# Patient Record
Sex: Male | Born: 1945 | Race: White
Health system: Southern US, Community
[De-identification: ages and names within clinical notes are randomized; demographics above are authoritative.]

## PROBLEM LIST (undated history)

## (undated) ENCOUNTER — Emergency Department (HOSPITAL_BASED_OUTPATIENT_CLINIC_OR_DEPARTMENT_OTHER): Admission: EM | Payer: Medicare Other | Source: Home / Self Care

## (undated) DIAGNOSIS — E785 Hyperlipidemia, unspecified: Secondary | ICD-10-CM

## (undated) DIAGNOSIS — I499 Cardiac arrhythmia, unspecified: Secondary | ICD-10-CM

## (undated) DIAGNOSIS — G4700 Insomnia, unspecified: Secondary | ICD-10-CM

## (undated) DIAGNOSIS — I10 Essential (primary) hypertension: Secondary | ICD-10-CM

## (undated) DIAGNOSIS — G4730 Sleep apnea, unspecified: Secondary | ICD-10-CM

## (undated) DIAGNOSIS — G2581 Restless legs syndrome: Secondary | ICD-10-CM

## (undated) DIAGNOSIS — R739 Hyperglycemia, unspecified: Secondary | ICD-10-CM

## (undated) DIAGNOSIS — IMO0001 Reserved for inherently not codable concepts without codable children: Secondary | ICD-10-CM

## (undated) DIAGNOSIS — R0600 Dyspnea, unspecified: Secondary | ICD-10-CM

## (undated) DIAGNOSIS — K5900 Constipation, unspecified: Secondary | ICD-10-CM

## (undated) DIAGNOSIS — Z7189 Other specified counseling: Secondary | ICD-10-CM

## (undated) DIAGNOSIS — C7A8 Other malignant neuroendocrine tumors: Secondary | ICD-10-CM

## (undated) DIAGNOSIS — R112 Nausea with vomiting, unspecified: Secondary | ICD-10-CM

## (undated) DIAGNOSIS — E119 Type 2 diabetes mellitus without complications: Secondary | ICD-10-CM

## (undated) DIAGNOSIS — Z923 Personal history of irradiation: Secondary | ICD-10-CM

## (undated) DIAGNOSIS — T8859XA Other complications of anesthesia, initial encounter: Secondary | ICD-10-CM

## (undated) DIAGNOSIS — IMO0002 Reserved for concepts with insufficient information to code with codable children: Secondary | ICD-10-CM

## (undated) DIAGNOSIS — C7B8 Other secondary neuroendocrine tumors: Secondary | ICD-10-CM

## (undated) DIAGNOSIS — C61 Malignant neoplasm of prostate: Secondary | ICD-10-CM

## (undated) DIAGNOSIS — Z9889 Other specified postprocedural states: Secondary | ICD-10-CM

## (undated) DIAGNOSIS — T4145XA Adverse effect of unspecified anesthetic, initial encounter: Secondary | ICD-10-CM

## (undated) HISTORY — DX: Other malignant neuroendocrine tumors: C7A.8

## (undated) HISTORY — PX: COLONOSCOPY: SHX5424

## (undated) HISTORY — PX: ESOPHAGOGASTRODUODENOSCOPY: SHX1529

## (undated) HISTORY — DX: Other secondary neuroendocrine tumors: C7B.8

## (undated) HISTORY — PX: COLONOSCOPY W/ POLYPECTOMY: SHX1380

## (undated) HISTORY — DX: Other specified counseling: Z71.89

## (undated) HISTORY — DX: Reserved for inherently not codable concepts without codable children: IMO0001

## (undated) HISTORY — PX: HAND SURGERY: SHX662

## (undated) HISTORY — DX: Essential (primary) hypertension: I10

## (undated) HISTORY — PX: ROBOT ASSISTED LAPAROSCOPIC RADICAL PROSTATECTOMY: SHX5141

## (undated) HISTORY — DX: Malignant neoplasm of prostate: C61

## (undated) HISTORY — PX: TONSILECTOMY/ADENOIDECTOMY WITH MYRINGOTOMY: SHX6125

## (undated) HISTORY — DX: Reserved for concepts with insufficient information to code with codable children: IMO0002

## (undated) HISTORY — DX: Personal history of irradiation: Z92.3

## (undated) HISTORY — PX: CIRCUMCISION: SUR203

## (undated) SURGERY — PERC NEPH TUBE PLACEMENT
Anesthesia: Conscious Sedation | Laterality: Bilateral

---

## 2011-07-20 ENCOUNTER — Telehealth: Payer: Self-pay | Admitting: Hematology & Oncology

## 2011-07-20 NOTE — Telephone Encounter (Signed)
Pt aware of 07-23-11 appointment

## 2011-07-23 ENCOUNTER — Ambulatory Visit (HOSPITAL_BASED_OUTPATIENT_CLINIC_OR_DEPARTMENT_OTHER): Payer: BC Managed Care – PPO | Admitting: Hematology & Oncology

## 2011-07-23 ENCOUNTER — Ambulatory Visit: Payer: BC Managed Care – PPO

## 2011-07-23 ENCOUNTER — Encounter: Payer: Self-pay | Admitting: Hematology & Oncology

## 2011-07-23 ENCOUNTER — Other Ambulatory Visit (HOSPITAL_BASED_OUTPATIENT_CLINIC_OR_DEPARTMENT_OTHER): Payer: BC Managed Care – PPO | Admitting: Lab

## 2011-07-23 VITALS — BP 124/81 | HR 73 | Temp 97.4°F | Ht 67.0 in | Wt 163.0 lb

## 2011-07-23 DIAGNOSIS — C61 Malignant neoplasm of prostate: Secondary | ICD-10-CM

## 2011-07-23 DIAGNOSIS — C7951 Secondary malignant neoplasm of bone: Secondary | ICD-10-CM

## 2011-07-23 DIAGNOSIS — R911 Solitary pulmonary nodule: Secondary | ICD-10-CM

## 2011-07-23 HISTORY — DX: Malignant neoplasm of prostate: C61

## 2011-07-23 LAB — COMPREHENSIVE METABOLIC PANEL
ALT: 18 U/L (ref 0–53)
AST: 19 U/L (ref 0–37)
Albumin: 4.2 g/dL (ref 3.5–5.2)
Alkaline Phosphatase: 108 U/L (ref 39–117)
BUN: 13 mg/dL (ref 6–23)
CO2: 27 mEq/L (ref 19–32)
Calcium: 9.5 mg/dL (ref 8.4–10.5)
Chloride: 102 mEq/L (ref 96–112)
Creatinine, Ser: 1.18 mg/dL (ref 0.50–1.35)
Glucose, Bld: 91 mg/dL (ref 70–99)
Potassium: 4.7 mEq/L (ref 3.5–5.3)
Sodium: 137 mEq/L (ref 135–145)
Total Bilirubin: 0.3 mg/dL (ref 0.3–1.2)
Total Protein: 7 g/dL (ref 6.0–8.3)

## 2011-07-23 LAB — CBC WITH DIFFERENTIAL (CANCER CENTER ONLY)
BASO#: 0.1 10*3/uL (ref 0.0–0.2)
BASO%: 0.8 % (ref 0.0–2.0)
EOS%: 1.6 % (ref 0.0–7.0)
Eosinophils Absolute: 0.1 10*3/uL (ref 0.0–0.5)
HCT: 44.4 % (ref 38.7–49.9)
HGB: 14.9 g/dL (ref 13.0–17.1)
LYMPH#: 1.6 10*3/uL (ref 0.9–3.3)
LYMPH%: 25.7 % (ref 14.0–48.0)
MCH: 30.2 pg (ref 28.0–33.4)
MCHC: 33.6 g/dL (ref 32.0–35.9)
MCV: 90 fL (ref 82–98)
MONO#: 0.7 10*3/uL (ref 0.1–0.9)
MONO%: 10.2 % (ref 0.0–13.0)
NEUT#: 3.9 10*3/uL (ref 1.5–6.5)
NEUT%: 61.7 % (ref 40.0–80.0)
Platelets: 289 10*3/uL (ref 145–400)
RBC: 4.93 10*6/uL (ref 4.20–5.70)
RDW: 13.1 % (ref 11.1–15.7)
WBC: 6.4 10*3/uL (ref 4.0–10.0)

## 2011-07-23 LAB — PSA: PSA: 0.38 ng/mL (ref <=4.00)

## 2011-07-23 LAB — TESTOSTERONE: Testosterone: 17.2 ng/dL — ABNORMAL LOW (ref 300–890)

## 2011-07-23 MED ORDER — BICALUTAMIDE 50 MG PO TABS: 50.0000 mg | ORAL_TABLET | Freq: Every day | ORAL | Status: AC

## 2011-07-23 NOTE — Progress Notes (Signed)
This office note has been dictated.

## 2011-07-24 ENCOUNTER — Telehealth: Payer: Self-pay | Admitting: Hematology & Oncology

## 2011-07-24 NOTE — Telephone Encounter (Signed)
Pt aware of 7-11 PET and to be NPO 6 hrs prior and 7-15 inj and 8-5 MD

## 2011-07-24 NOTE — Progress Notes (Signed)
CC:   [facility] Fax [phone] [name], MD  DIAGNOSIS:  Metastatic prostate cancer, hormone responsive.  HISTORY OF PRESENT ILLNESS:  [name] is a very nice 66 year old white gentleman.  He is a Technical sales engineer.  He lives down in the [place] area.  He basically presented with localized prostate cancer back in December of 2012.  He underwent a robotic assisted laparoscopic prostatectomy. He had bilateral pelvic lymph node dissection.  This was performed in March of 2013.  His Gleason score was 7.  He had 2 lymph nodes that were negative for tumor.  His initial stage was IIB (T2c N0 M0).  His PSA when checked in June was only down to 3.5.  His PSA went back up.  On 06/19 PSA was 4.74.  He then underwent scanning.  He had a CT scan done in the office of Dr. Margo Aye.  This was of the abdomen and pelvis.  This showed a right lower lung nodule.  There is a questionable nodule in the right hemidiaphragm. No lymphadenopathy was noted in the abdomen or pelvis.  A bone scan was done.  This was done at [facility].  This did show a right posterior sacral ala lesion.  This was "concerning" for metastatic disease.  He was started on androgen deprivation therapy.  He received a dose of Firmagon I think on 06/28.  Dr. Margo Aye kindly referred [name] to the [facility] for further evaluation to see what else might be helpful.  [name] feels okay.  He is not complaining of any pain.  He has not noted any change in bowel or bladder habits.  He has not had any hot flashes or sweats since the [place] injection.  There was some burning at the site of the injection.  He has had no cough.  He has had no headache.  He did have an episode, a couple episodes of vomiting.  He has not had any rash.  There has been no bleeding.  He has had no hair loss.  There has been no leg swelling.  He has not noted any joint aches or pains.  PAST  MEDICAL HISTORY: 1. Hypertension. 2. Hyperlipidemia. 3. Allergic rhinitis.  ALLERGIES: None.  MEDICATIONS: 1. Bactrim DS 1 p.o. b.i.d. 2. Zegerid 1 p.o. daily. 3. Calcium with vitamin D 1 p.o. b.i.d.  SOCIAL HISTORY:  Negative for tobacco use.  He has no alcohol use. There is no obvious occupational exposure.  FAMILY HISTORY:  Remarkable for father who had prostate cancer in his 65s.  His father did not die of prostate cancer.  REVIEW OF SYSTEMS:  As stated in the history of present illness.  No additional findings are noted on a 12 system review.  PHYSICAL EXAMINATION:  General:  This is a well-developed, well- nourished white gentleman in no obvious distress.  Vital signs: Temperature of 97.4, pulse 72, respiratory rate 18, blood pressure 124/81.  Weight is 163.  Head and neck:  Shows a normocephalic, atraumatic skull.  There are no ocular or oral lesions.  There are no palpable cervical or supraclavicular lymph nodes.  Lungs:  Clear to percussion and auscultation bilaterally.  Cardiac:  Regular rate and rhythm with a normal S1, S2.  There are no murmurs, rubs or bruits. Abdomen:  Soft with good bowel sounds.  There is no palpable abdominal mass.  There is no fluid wave.  There is no palpable hepatosplenomegaly. Back:  No tenderness over the  spine, ribs or hips.  Extremities:  Show no clubbing, no cyanosis or edema.  He has good range motion of his joints.  Skin:  Shows no rashes, ecchymoses or petechiae.  He may have a couple areas of folliculitis under his left mandible.  Neurological: Shows no focal neurological deficits.  LABORATORY STUDIES:  White cell count is 6.4, hemoglobin 14.9, hematocrit 44.4, platelet count 289.  His BUN and creatinine are 13 and 1.18.  Calcium is 9.5 with an albumin of 4.2.  Alkaline phosphatase is 108.  IMPRESSION:  [name] is a 66 year old gentleman with what certainly appears to be metastatic prostate cancer.  This is quite concerning.   He just had his radical prostatectomy back in March.  His PSA was not that high.  Unfortunately, PSA did not go down to 0.  He had repeat scans which seemed to show a right sacral alar met.  There is also a right lung nodule which might or may not be metastatic.  I think the fact that he has a fairly high Gleason score is worrisome for him having aggressive disease.  He received a dose of [name] which is in [place]. However, he has not had any menopausal type symptoms of hot flashes or sweats which can be often seen after Firmagon injection.  I really worry about this type of malignancy becoming progressive and becoming castrate resistant.  As such, I think that I would prefer to totally deplete his androgen sources.  He got the [place].  This is a GnRH receptor antagonist.  This rapidly depletes testosterone supplies. We are checking a testosterone level on him.  I would like to add an antiandrogen to this.  I think a total androgen deprivation therapy is appropriate given, what I perceive to be, fairly aggressive prostate cancer.  Hopefully, we will be able to see his PSA coming down.  I want to put him on Casodex.  I think this would be appropriate for him also.  I also believe that given that he has this sacral met that we could consider Xgeva for him.  I am not sure what to make of the CT scan.  I do not know if this pulmonary nodule is something that has been long-term or not.  Certainly it is not that big.  First, I would be very surprised if he had metastasis to his lung.  I will see if we can get a PET scan for him.  There may be some controversy regarding PET scans in prostate cancer but I think it is not unreasonable to order this.  I spent a good hour and a half with [name] and his family.  They are all very, very nice.  I answered all their questions.  I believe that we can certainly take care of him in the office.  We can do all  the necessary therapy treatment for him.  [name] would like this as it would help cut down on the doctors that he has to see.  I will plan to see [name] back in about a month.  We will recheck his PSA when we see him.  I probably would repeat his scans in about 2 months or so, so we can see what kind of effect we have with total androgen blockade.    ______________________________ Josph Macho, M.D. PRE/MEDQ  D:  07/23/2011  T:  07/23/2011  Job:  [id number]

## 2011-07-26 ENCOUNTER — Encounter (HOSPITAL_COMMUNITY)
Admission: RE | Admit: 2011-07-26 | Discharge: 2011-07-26 | Disposition: A | Discharge status: Home / Self Care | Payer: BC Managed Care – PPO | Source: Ambulatory Visit | Attending: Hematology & Oncology | Admitting: Hematology & Oncology

## 2011-07-26 ENCOUNTER — Encounter (HOSPITAL_COMMUNITY): Payer: Self-pay

## 2011-07-26 DIAGNOSIS — Z9079 Acquired absence of other genital organ(s): Secondary | ICD-10-CM | POA: Insufficient documentation

## 2011-07-26 DIAGNOSIS — C61 Malignant neoplasm of prostate: Secondary | ICD-10-CM | POA: Insufficient documentation

## 2011-07-26 DIAGNOSIS — I517 Cardiomegaly: Secondary | ICD-10-CM | POA: Insufficient documentation

## 2011-07-26 DIAGNOSIS — N200 Calculus of kidney: Secondary | ICD-10-CM | POA: Insufficient documentation

## 2011-07-26 DIAGNOSIS — M899 Disorder of bone, unspecified: Secondary | ICD-10-CM | POA: Insufficient documentation

## 2011-07-26 DIAGNOSIS — C7951 Secondary malignant neoplasm of bone: Secondary | ICD-10-CM | POA: Insufficient documentation

## 2011-07-26 DIAGNOSIS — C7952 Secondary malignant neoplasm of bone marrow: Secondary | ICD-10-CM | POA: Insufficient documentation

## 2011-07-26 DIAGNOSIS — I2510 Atherosclerotic heart disease of native coronary artery without angina pectoris: Secondary | ICD-10-CM | POA: Insufficient documentation

## 2011-07-26 DIAGNOSIS — R918 Other nonspecific abnormal finding of lung field: Secondary | ICD-10-CM | POA: Insufficient documentation

## 2011-07-26 DIAGNOSIS — M949 Disorder of cartilage, unspecified: Secondary | ICD-10-CM | POA: Insufficient documentation

## 2011-07-26 DIAGNOSIS — K7689 Other specified diseases of liver: Secondary | ICD-10-CM | POA: Insufficient documentation

## 2011-07-26 LAB — GLUCOSE, CAPILLARY: Glucose-Capillary: 93 mg/dL (ref 70–99)

## 2011-07-26 MED ORDER — FLUDEOXYGLUCOSE F - 18 (FDG) INJECTION
15.1000 | Freq: Once | INTRAVENOUS | Status: AC | PRN
  Administered 2011-07-26: 15.1 via INTRAVENOUS

## 2011-07-27 ENCOUNTER — Other Ambulatory Visit: Payer: Self-pay

## 2011-07-27 ENCOUNTER — Telehealth: Payer: Self-pay | Admitting: Hematology & Oncology

## 2011-07-27 NOTE — Telephone Encounter (Signed)
Faxed completed prior authorization request form for EXGEVA to [facility] for review.

## 2011-07-30 ENCOUNTER — Ambulatory Visit (HOSPITAL_BASED_OUTPATIENT_CLINIC_OR_DEPARTMENT_OTHER): Payer: BC Managed Care – PPO

## 2011-07-30 VITALS — BP 136/76 | HR 73 | Temp 96.7°F

## 2011-07-30 DIAGNOSIS — C7951 Secondary malignant neoplasm of bone: Secondary | ICD-10-CM

## 2011-07-30 DIAGNOSIS — C61 Malignant neoplasm of prostate: Secondary | ICD-10-CM

## 2011-07-30 MED ORDER — SODIUM CHLORIDE 0.9 % IV SOLN: Freq: Once | INTRAVENOUS | Status: DC | PRN

## 2011-07-30 MED ORDER — DENOSUMAB 120 MG/1.7ML ~~LOC~~ SOLN
120.0000 mg | Freq: Once | SUBCUTANEOUS | Status: AC
  Administered 2011-07-30: 120 mg via SUBCUTANEOUS
  Filled 2011-07-30: qty 1.7

## 2011-07-30 MED ORDER — EPINEPHRINE HCL 0.1 MG/ML IJ SOLN
0.2500 mg | Freq: Once | INTRAMUSCULAR | Status: DC | PRN
  Filled 2011-07-30: qty 10

## 2011-07-30 NOTE — Patient Instructions (Signed)
Denosumab injection What is this medicine? DENOSUMAB slows bone breakdown. It is used to treat osteoporosis in women after menopause. This medicine is also used to prevent bone fractures and other bone problems caused by cancer bone metastases. This medicine may be used for other purposes; ask your health care provider or pharmacist if you have questions. What should I tell my health care provider before I take this medicine? They need to know if you have any of these conditions: -dental disease -eczema -infection or history of infections -kidney disease or on dialysis -low blood calcium or vitamin D -malabsorption syndrome -scheduled to have surgery or tooth extraction -taking medicine that contains denosumab -thyroid or parathyroid disease -an unusual reaction to denosumab, other medicines, foods, dyes, or preservatives -pregnant or trying to get pregnant -breast-feeding How should I use this medicine? This medicine is for injection under the skin. It is given by a health care professional in a hospital or clinic setting. If you are getting Prolia, a special MedGuide will be given to you by the pharmacist with each prescription and refill. Be sure to read this information carefully each time. Talk to your pediatrician regarding the use of this medicine in children. Special care may be needed. Overdosage: If you think you've taken too much of this medicine contact a poison control center or emergency room at once. Overdosage: If you think you have taken too much of this medicine contact a poison control center or emergency room at once. NOTE: This medicine is only for you. Do not share this medicine with others. What if I miss a dose? It is important not to miss your dose. Call your doctor or health care professional if you are unable to keep an appointment. What may interact with this medicine? Do not take this medicine with any of the following medications: -other medicines containing  denosumab This medicine may also interact with the following medications: -medicines that suppress the immune system -medicines that treat cancer -steroid medicines like prednisone or cortisone This list may not describe all possible interactions. Give your health care provider a list of all the medicines, herbs, non-prescription drugs, or dietary supplements you use. Also tell them if you smoke, drink alcohol, or use illegal drugs. Some items may interact with your medicine. What should I watch for while using this medicine? Visit your doctor or health care professional for regular checks on your progress. Your doctor or health care professional may order blood tests and other tests to see how you are doing. Call your doctor or health care professional if you get a cold or other infection while receiving this medicine. Do not treat yourself. This medicine may decrease your body's ability to fight infection. You should make sure you get enough calcium and vitamin D while you are taking this medicine, unless your doctor tells you not to. Discuss the foods you eat and the vitamins you take with your health care professional. See your dentist regularly. Brush and floss your teeth as directed. Before you have any dental work done, tell your dentist you are receiving this medicine. What side effects may I notice from receiving this medicine? Side effects that you should report to your doctor or health care professional as soon as possible: -allergic reactions like skin rash, itching or hives, swelling of the face, lips, or tongue -breathing problems -chest pain -fast, irregular heartbeat -feeling faint or lightheaded, falls -fever, chills, or any other sign of infection -muscle spasms, tightening, or twitches -numbness or tingling -skin   blisters or bumps, or is dry, peels, or red -slow healing or unexplained pain in the mouth or jaw -unusual bleeding or bruising Side effects that usually do not  require medical attention (Report these to your doctor or health care professional if they continue or are bothersome.): -muscle pain -stomach upset, gas This list may not describe all possible side effects. Call your doctor for medical advice about side effects. You may report side effects to FDA at 1-800-FDA-1088. Where should I keep my medicine? This medicine is only given in a clinic, doctor's office, or other health care setting and will not be stored at home. NOTE: This sheet is a summary. It may not cover all possible information. If you have questions about this medicine, talk to your doctor, pharmacist, or health care provider.  2012, Elsevier/Gold Standard. (08/24/2009 2:32:46 PM) 

## 2011-07-31 ENCOUNTER — Encounter: Payer: Self-pay | Admitting: Hematology & Oncology

## 2011-08-13 ENCOUNTER — Telehealth: Payer: Self-pay | Admitting: Hematology & Oncology

## 2011-08-13 ENCOUNTER — Other Ambulatory Visit: Payer: Self-pay | Admitting: Hematology & Oncology

## 2011-08-13 NOTE — Telephone Encounter (Signed)
Spoke to patient and informed him of his appointment change.  Patient is now scheduled for 08/27/11 and is aware.

## 2011-08-20 ENCOUNTER — Ambulatory Visit: Payer: BC Managed Care – PPO

## 2011-08-20 ENCOUNTER — Ambulatory Visit: Payer: BC Managed Care – PPO | Admitting: Hematology & Oncology

## 2011-08-20 ENCOUNTER — Other Ambulatory Visit: Payer: BC Managed Care – PPO | Admitting: Lab

## 2011-08-27 ENCOUNTER — Ambulatory Visit (HOSPITAL_BASED_OUTPATIENT_CLINIC_OR_DEPARTMENT_OTHER): Payer: BC Managed Care – PPO | Admitting: Hematology & Oncology

## 2011-08-27 ENCOUNTER — Ambulatory Visit: Payer: BC Managed Care – PPO

## 2011-08-27 ENCOUNTER — Other Ambulatory Visit (HOSPITAL_BASED_OUTPATIENT_CLINIC_OR_DEPARTMENT_OTHER): Payer: BC Managed Care – PPO | Admitting: Lab

## 2011-08-27 VITALS — BP 131/76 | HR 78 | Temp 97.5°F | Resp 20 | Ht 67.0 in | Wt 165.0 lb

## 2011-08-27 DIAGNOSIS — C7951 Secondary malignant neoplasm of bone: Secondary | ICD-10-CM

## 2011-08-27 DIAGNOSIS — E291 Testicular hypofunction: Secondary | ICD-10-CM

## 2011-08-27 DIAGNOSIS — C61 Malignant neoplasm of prostate: Secondary | ICD-10-CM

## 2011-08-27 LAB — CBC WITH DIFFERENTIAL (CANCER CENTER ONLY)
BASO#: 0 10*3/uL (ref 0.0–0.2)
BASO%: 0.5 % (ref 0.0–2.0)
EOS%: 1.1 % (ref 0.0–7.0)
Eosinophils Absolute: 0.1 10*3/uL (ref 0.0–0.5)
HCT: 42.1 % (ref 38.7–49.9)
HGB: 14.2 g/dL (ref 13.0–17.1)
LYMPH#: 1.7 10*3/uL (ref 0.9–3.3)
LYMPH%: 30.5 % (ref 14.0–48.0)
MCH: 30.6 pg (ref 28.0–33.4)
MCHC: 33.7 g/dL (ref 32.0–35.9)
MCV: 91 fL (ref 82–98)
MONO#: 0.5 10*3/uL (ref 0.1–0.9)
MONO%: 9.9 % (ref 0.0–13.0)
NEUT#: 3.2 10*3/uL (ref 1.5–6.5)
NEUT%: 58 % (ref 40.0–80.0)
Platelets: 232 10*3/uL (ref 145–400)
RBC: 4.64 10*6/uL (ref 4.20–5.70)
RDW: 13.2 % (ref 11.1–15.7)
WBC: 5.5 10*3/uL (ref 4.0–10.0)

## 2011-08-27 LAB — CMP (CANCER CENTER ONLY)
ALT(SGPT): 16 U/L (ref 10–47)
AST: 22 U/L (ref 11–38)
Albumin: 3.7 g/dL (ref 3.3–5.5)
Alkaline Phosphatase: 95 U/L — ABNORMAL HIGH (ref 26–84)
BUN, Bld: 13 mg/dL (ref 7–22)
CO2: 29 mEq/L (ref 18–33)
Calcium: 8.9 mg/dL (ref 8.0–10.3)
Chloride: 98 mEq/L (ref 98–108)
Creat: 1.3 mg/dl — ABNORMAL HIGH (ref 0.6–1.2)
Glucose, Bld: 103 mg/dL (ref 73–118)
Potassium: 4.4 mEq/L (ref 3.3–4.7)
Sodium: 139 mEq/L (ref 128–145)
Total Bilirubin: 0.7 mg/dl (ref 0.20–1.60)
Total Protein: 7.5 g/dL (ref 6.4–8.1)

## 2011-08-27 LAB — PSA: PSA: <0.01 ng/mL (ref <=4.00)

## 2011-08-27 NOTE — Progress Notes (Signed)
This office note has been dictated.

## 2011-08-28 ENCOUNTER — Ambulatory Visit (HOSPITAL_BASED_OUTPATIENT_CLINIC_OR_DEPARTMENT_OTHER): Payer: BC Managed Care – PPO

## 2011-08-28 VITALS — BP 125/85 | HR 73 | Temp 97.3°F | Resp 16

## 2011-08-28 DIAGNOSIS — Z5111 Encounter for antineoplastic chemotherapy: Secondary | ICD-10-CM

## 2011-08-28 DIAGNOSIS — C7951 Secondary malignant neoplasm of bone: Secondary | ICD-10-CM

## 2011-08-28 DIAGNOSIS — C61 Malignant neoplasm of prostate: Secondary | ICD-10-CM

## 2011-08-28 MED ORDER — DENOSUMAB 120 MG/1.7ML ~~LOC~~ SOLN
120.0000 mg | Freq: Once | SUBCUTANEOUS | Status: AC
  Administered 2011-08-28: 120 mg via SUBCUTANEOUS
  Filled 2011-08-28: qty 1.7

## 2011-08-28 MED ORDER — DEGARELIX ACETATE 80 MG ~~LOC~~ SOLR
80.0000 mg | Freq: Once | SUBCUTANEOUS | Status: AC
  Administered 2011-08-28: 80 mg via SUBCUTANEOUS
  Filled 2011-08-28: qty 4

## 2011-08-28 NOTE — Progress Notes (Signed)
CC:   [name], MD [facility] (fax [phone])  DIAGNOSES:  Metastatic prostate cancer-hormone responsive.  CURRENT THERAPY: 1. Androgen deprivation therapy with Casodex 50 mg p.o. daily. 2. [name] every month. 3. Exgeva monthly.  INTERIM HISTORY:  Mr. Shugart comes in for followup.  He is doing well. We did go ahead and get a PET scan on him when we first saw him.  This was done on July 11th.  The PET scan did show right sacral osseous metastasis.  No other areas of metastasis were noted.  He did get his lab work done initially.  PSA was 0.38.  His testosterone was down to 17.2.  He has tolerated the Casodex well.  He has had no problems with nausea or vomiting.  There has been no bony pain.  He has had no cough or shortness of breath.  He has had no rashes.  He has no headache.  He has had no change in bowel or bladder habits.  He did see the dermatologist today.  His skin came out clear.  PHYSICAL EXAMINATION:  General:  This is a well-developed, well- nourished, white gentleman in no obvious distress.  Vital signs:  Show a temperature of 97.5, pulse of 78, respiratory rate 18, blood pressure 131/76.  Weight is 165.  Head and neck:  Normocephalic, atraumatic skull.  There are no ocular or oral lesions.  There are no palpable cervical or supraclavicular lymph nodes.  Lungs:  Clear bilaterally. Cardiac:  Regular rate and rhythm with a normal S1 and S2.  There are no murmurs, rubs or bruits.  Abdomen:  Soft with good bowel sounds.  There is no palpable abdominal mass.  There is no fluid wave.  No palpable hepatosplenomegaly is noted.  Back:  No tenderness over the spine, ribs, or hips.  Extremities:  Shows no clubbing, cyanosis or edema.  LABORATORY STUDIES:  White cell count is 5.5, hemoglobin 14.2, hematocrit 43.1, platelet count 232.  IMPRESSION:  Mr. Deese is a 66 year old gentleman with metastatic prostate cancer.  He is on androgen deprivation  therapy.  He initially got his [name] elsewhere.  We will have to see when he needs to get this again.  [name] is a GNRH receptor antagonist.  We will go ahead and give him the Casodex today.  We will plan to get him back in 1 more month.   ______________________________ Josph Macho, M.D. PRE/MEDQ  D:  08/27/2011  T:  08/28/2011  Job:  [id number]

## 2011-08-29 ENCOUNTER — Telehealth: Payer: Self-pay | Admitting: *Deleted

## 2011-08-29 NOTE — Telephone Encounter (Signed)
Message copied by Anselm Jungling on Wed Aug 29, 2011 12:58 PM ------      Message from: Josph Macho      Created: Tue Aug 28, 2011  9:11 PM       Call - PSA is now so low it is undetectable!!!  pete

## 2011-08-29 NOTE — Telephone Encounter (Signed)
Called patient to let him know that his PSA was now so low it is undectectable.

## 2011-09-11 ENCOUNTER — Telehealth: Payer: Self-pay | Admitting: Hematology & Oncology

## 2011-09-11 NOTE — Telephone Encounter (Signed)
Patient's wife called and cx 09/24/11 appt and resch for 09/28/11

## 2011-09-24 ENCOUNTER — Other Ambulatory Visit: Payer: BC Managed Care – PPO | Admitting: Lab

## 2011-09-24 ENCOUNTER — Ambulatory Visit: Payer: BC Managed Care – PPO | Admitting: Hematology & Oncology

## 2011-09-24 ENCOUNTER — Ambulatory Visit: Payer: BC Managed Care – PPO

## 2011-09-28 ENCOUNTER — Other Ambulatory Visit (HOSPITAL_BASED_OUTPATIENT_CLINIC_OR_DEPARTMENT_OTHER): Payer: BC Managed Care – PPO | Admitting: Lab

## 2011-09-28 ENCOUNTER — Ambulatory Visit (HOSPITAL_BASED_OUTPATIENT_CLINIC_OR_DEPARTMENT_OTHER): Payer: BC Managed Care – PPO | Admitting: Hematology & Oncology

## 2011-09-28 ENCOUNTER — Ambulatory Visit (HOSPITAL_BASED_OUTPATIENT_CLINIC_OR_DEPARTMENT_OTHER): Payer: BC Managed Care – PPO

## 2011-09-28 VITALS — BP 129/72 | HR 81 | Temp 97.8°F | Resp 20 | Ht 67.0 in | Wt 164.0 lb

## 2011-09-28 DIAGNOSIS — C61 Malignant neoplasm of prostate: Secondary | ICD-10-CM

## 2011-09-28 DIAGNOSIS — Z5111 Encounter for antineoplastic chemotherapy: Secondary | ICD-10-CM

## 2011-09-28 DIAGNOSIS — C7951 Secondary malignant neoplasm of bone: Secondary | ICD-10-CM

## 2011-09-28 DIAGNOSIS — N951 Menopausal and female climacteric states: Secondary | ICD-10-CM

## 2011-09-28 DIAGNOSIS — G4700 Insomnia, unspecified: Secondary | ICD-10-CM

## 2011-09-28 DIAGNOSIS — C7952 Secondary malignant neoplasm of bone marrow: Secondary | ICD-10-CM

## 2011-09-28 LAB — PSA: PSA: <0.01 ng/mL (ref <=4.00)

## 2011-09-28 LAB — CBC WITH DIFFERENTIAL (CANCER CENTER ONLY)
BASO#: 0 10*3/uL (ref 0.0–0.2)
BASO%: 0.2 % (ref 0.0–2.0)
EOS%: 1.9 % (ref 0.0–7.0)
Eosinophils Absolute: 0.1 10*3/uL (ref 0.0–0.5)
HCT: 42.7 % (ref 38.7–49.9)
HGB: 14.3 g/dL (ref 13.0–17.1)
LYMPH#: 1.6 10*3/uL (ref 0.9–3.3)
LYMPH%: 34.5 % (ref 14.0–48.0)
MCH: 30.6 pg (ref 28.0–33.4)
MCHC: 33.5 g/dL (ref 32.0–35.9)
MCV: 91 fL (ref 82–98)
MONO#: 0.4 10*3/uL (ref 0.1–0.9)
MONO%: 7.5 % (ref 0.0–13.0)
NEUT#: 2.6 10*3/uL (ref 1.5–6.5)
NEUT%: 55.9 % (ref 40.0–80.0)
Platelets: 210 10*3/uL (ref 145–400)
RBC: 4.67 10*6/uL (ref 4.20–5.70)
RDW: 13.3 % (ref 11.1–15.7)
WBC: 4.6 10*3/uL (ref 4.0–10.0)

## 2011-09-28 LAB — COMPREHENSIVE METABOLIC PANEL
ALT: 17 U/L (ref 0–53)
AST: 17 U/L (ref 0–37)
Albumin: 4.6 g/dL (ref 3.5–5.2)
Alkaline Phosphatase: 65 U/L (ref 39–117)
BUN: 12 mg/dL (ref 6–23)
CO2: 27 mEq/L (ref 19–32)
Calcium: 9.6 mg/dL (ref 8.4–10.5)
Chloride: 103 mEq/L (ref 96–112)
Creatinine, Ser: 1.01 mg/dL (ref 0.50–1.35)
Glucose, Bld: 102 mg/dL — ABNORMAL HIGH (ref 70–99)
Potassium: 4.3 mEq/L (ref 3.5–5.3)
Sodium: 139 mEq/L (ref 135–145)
Total Bilirubin: 0.6 mg/dL (ref 0.3–1.2)
Total Protein: 7.2 g/dL (ref 6.0–8.3)

## 2011-09-28 MED ORDER — SODIUM CHLORIDE 0.9 % IV SOLN: Freq: Once | INTRAVENOUS | Status: DC | PRN

## 2011-09-28 MED ORDER — DEGARELIX ACETATE 80 MG ~~LOC~~ SOLR
80.0000 mg | Freq: Once | SUBCUTANEOUS | Status: AC
  Administered 2011-09-28: 80 mg via SUBCUTANEOUS
  Filled 2011-09-28: qty 4

## 2011-09-28 MED ORDER — ZOLPIDEM TARTRATE 5 MG PO TABS: 5.0000 mg | ORAL_TABLET | Freq: Every evening | ORAL | Status: DC | PRN

## 2011-09-28 MED ORDER — DIPHENHYDRAMINE HCL 50 MG/ML IJ SOLN: 25.0000 mg | Freq: Once | INTRAMUSCULAR | Status: DC | PRN

## 2011-09-28 MED ORDER — DIPHENHYDRAMINE HCL 50 MG/ML IJ SOLN: 50.0000 mg | Freq: Once | INTRAMUSCULAR | Status: DC | PRN

## 2011-09-28 MED ORDER — ALBUTEROL SULFATE (2.5 MG/3ML) 0.083% IN NEBU
2.5000 mg | INHALATION_SOLUTION | Freq: Once | RESPIRATORY_TRACT | Status: DC | PRN
  Filled 2011-09-28: qty 3

## 2011-09-28 MED ORDER — EPINEPHRINE HCL 0.1 MG/ML IJ SOLN
0.2500 mg | Freq: Once | INTRAMUSCULAR | Status: DC | PRN
  Filled 2011-09-28: qty 10

## 2011-09-28 MED ORDER — DENOSUMAB 120 MG/1.7ML ~~LOC~~ SOLN
120.0000 mg | Freq: Once | SUBCUTANEOUS | Status: AC
  Administered 2011-09-28: 120 mg via SUBCUTANEOUS
  Filled 2011-09-28: qty 1.7

## 2011-09-28 MED ORDER — METHYLPREDNISOLONE SODIUM SUCC 125 MG IJ SOLR
125.0000 mg | Freq: Once | INTRAMUSCULAR | Status: DC | PRN
Start: 2011-09-28 — End: 2011-09-28

## 2011-09-28 MED ORDER — GABAPENTIN 100 MG PO CAPS: ORAL_CAPSULE | ORAL | Status: DC

## 2011-09-28 NOTE — Progress Notes (Signed)
This office note has been dictated.

## 2011-10-01 NOTE — Progress Notes (Signed)
CC:   [facility], [place] [phone]  DIAGNOSIS:  Metastatic prostate cancer, hormone-responsive.  CURRENT THERAPY: 1. Casodex 50 mg p.o. daily. 2. Firmagon q. month. 3. Xgeva 120 mg subcu q. Month.  INTERIM HISTORY:  [name] comes in for his followup.  He feels fairly well.  He has had no problems with nausea or vomiting.  He really has not had any pain issues.  He has had no fever, sweats or chills.  He does state some problems with hot flashes.  He is not sleeping all that well.  I did go ahead and give him a prescription for Neurontin 100 mg p.o. q.8 hours and Ambien 5 mg p.o. q.h.s. p.r.n.  His last PSA was less than 0.01.  PHYSICAL EXAMINATION:  This is a well-developed, well-nourished white gentleman in no obvious distress.  Vital signs:  97.8, pulse 81, respiratory rate 20, blood pressure 129/72.  Weight is 164.  Head and neck:  Normocephalic, atraumatic skull.  There are no ocular or oral lesions.  There are no palpable cervical or supraclavicular lymph nodes. Lungs:  Clear bilaterally.  Cardiac:  Regular rate and rhythm with a normal S1 and S2.  There are no murmurs, rubs or bruits.  Abdomen:  Soft with good bowel sounds.  There is no palpable abdominal mass.  There is no fluid wave.  There is no palpable hepatosplenomegaly.  Extremities: No clubbing, cyanosis or edema.  LABORATORY STUDIES:  White count is 4.6, hemoglobin 14.3, hematocrit 42.7, platelet count is 210.  IMPRESSION:  [name] is a 66 year old gentleman with metastatic prostate cancer.  He has bone metastasis.  He is on an androgen deprivation.  He gets from [place] and Casodex.  Again, this has helped by his PSA.  We will go ahead and plan for a followup bone scan next month.  I will get this before I see him back.  I will see him back in 1 month.   ______________________________ Josph Macho, M.D. PRE/MEDQ  D:  09/28/2011  T:  09/28/2011  Job:  [id number]

## 2011-10-04 ENCOUNTER — Telehealth: Payer: Self-pay | Admitting: *Deleted

## 2011-10-04 NOTE — Telephone Encounter (Signed)
Message copied by Anselm Jungling on Thu Oct 04, 2011 11:02 AM ------      Message from: Arlan Organ R      Created: Wed Oct 03, 2011 11:35 AM       Call - PSA is too low to measure!!!  Cindee Lame

## 2011-10-04 NOTE — Telephone Encounter (Signed)
Called patient to let him know that his PSA level was too low to measure per dr. Myna Hidalgo

## 2011-10-23 ENCOUNTER — Encounter (HOSPITAL_COMMUNITY): Payer: BC Managed Care – PPO

## 2011-10-23 ENCOUNTER — Ambulatory Visit (HOSPITAL_COMMUNITY): Payer: BC Managed Care – PPO

## 2011-10-25 ENCOUNTER — Ambulatory Visit: Payer: BC Managed Care – PPO | Admitting: Hematology & Oncology

## 2011-10-25 ENCOUNTER — Other Ambulatory Visit: Payer: BC Managed Care – PPO | Admitting: Lab

## 2011-10-25 ENCOUNTER — Ambulatory Visit: Payer: BC Managed Care – PPO

## 2011-10-30 ENCOUNTER — Ambulatory Visit (HOSPITAL_COMMUNITY)
Admission: RE | Admit: 2011-10-30 | Discharge: 2011-10-30 | Disposition: A | Discharge status: Home / Self Care | Payer: BC Managed Care – PPO | Source: Ambulatory Visit | Attending: Hematology & Oncology | Admitting: Hematology & Oncology

## 2011-10-30 ENCOUNTER — Encounter (HOSPITAL_COMMUNITY)
Admission: RE | Admit: 2011-10-30 | Discharge: 2011-10-30 | Disposition: A | Discharge status: Home / Self Care | Payer: BC Managed Care – PPO | Source: Ambulatory Visit | Attending: Hematology & Oncology | Admitting: Hematology & Oncology

## 2011-10-30 DIAGNOSIS — C61 Malignant neoplasm of prostate: Secondary | ICD-10-CM | POA: Insufficient documentation

## 2011-10-30 DIAGNOSIS — C7951 Secondary malignant neoplasm of bone: Secondary | ICD-10-CM | POA: Insufficient documentation

## 2011-10-30 DIAGNOSIS — G4700 Insomnia, unspecified: Secondary | ICD-10-CM

## 2011-10-30 DIAGNOSIS — N951 Menopausal and female climacteric states: Secondary | ICD-10-CM

## 2011-10-30 MED ORDER — TECHNETIUM TC 99M MEDRONATE IV KIT
25.0000 | PACK | Freq: Once | INTRAVENOUS | Status: AC | PRN
  Administered 2011-10-30: 25 via INTRAVENOUS

## 2011-11-02 ENCOUNTER — Ambulatory Visit (HOSPITAL_BASED_OUTPATIENT_CLINIC_OR_DEPARTMENT_OTHER): Payer: BC Managed Care – PPO

## 2011-11-02 ENCOUNTER — Ambulatory Visit (HOSPITAL_BASED_OUTPATIENT_CLINIC_OR_DEPARTMENT_OTHER): Payer: BC Managed Care – PPO | Admitting: Hematology & Oncology

## 2011-11-02 ENCOUNTER — Other Ambulatory Visit (HOSPITAL_BASED_OUTPATIENT_CLINIC_OR_DEPARTMENT_OTHER): Payer: BC Managed Care – PPO | Admitting: Lab

## 2011-11-02 VITALS — BP 138/65 | HR 72 | Temp 98.1°F | Resp 18 | Ht 67.0 in | Wt 165.0 lb

## 2011-11-02 DIAGNOSIS — C7951 Secondary malignant neoplasm of bone: Secondary | ICD-10-CM

## 2011-11-02 DIAGNOSIS — C61 Malignant neoplasm of prostate: Secondary | ICD-10-CM

## 2011-11-02 DIAGNOSIS — N951 Menopausal and female climacteric states: Secondary | ICD-10-CM

## 2011-11-02 DIAGNOSIS — G4700 Insomnia, unspecified: Secondary | ICD-10-CM

## 2011-11-02 DIAGNOSIS — Z5111 Encounter for antineoplastic chemotherapy: Secondary | ICD-10-CM

## 2011-11-02 DIAGNOSIS — C7952 Secondary malignant neoplasm of bone marrow: Secondary | ICD-10-CM

## 2011-11-02 LAB — CBC WITH DIFFERENTIAL (CANCER CENTER ONLY)
BASO#: 0 10*3/uL (ref 0.0–0.2)
BASO%: 0.3 % (ref 0.0–2.0)
EOS%: 2.2 % (ref 0.0–7.0)
Eosinophils Absolute: 0.1 10*3/uL (ref 0.0–0.5)
HCT: 40.6 % (ref 38.7–49.9)
HGB: 14 g/dL (ref 13.0–17.1)
LYMPH#: 1.9 10*3/uL (ref 0.9–3.3)
LYMPH%: 32.2 % (ref 14.0–48.0)
MCH: 31.5 pg (ref 28.0–33.4)
MCHC: 34.5 g/dL (ref 32.0–35.9)
MCV: 91 fL (ref 82–98)
MONO#: 0.6 10*3/uL (ref 0.1–0.9)
MONO%: 9.7 % (ref 0.0–13.0)
NEUT#: 3.2 10*3/uL (ref 1.5–6.5)
NEUT%: 55.6 % (ref 40.0–80.0)
Platelets: 208 10*3/uL (ref 145–400)
RBC: 4.44 10*6/uL (ref 4.20–5.70)
RDW: 12.7 % (ref 11.1–15.7)
WBC: 5.8 10*3/uL (ref 4.0–10.0)

## 2011-11-02 LAB — COMPREHENSIVE METABOLIC PANEL
ALT: 14 U/L (ref 0–53)
AST: 16 U/L (ref 0–37)
Albumin: 4.3 g/dL (ref 3.5–5.2)
Alkaline Phosphatase: 66 U/L (ref 39–117)
BUN: 13 mg/dL (ref 6–23)
CO2: 29 mEq/L (ref 19–32)
Calcium: 9.8 mg/dL (ref 8.4–10.5)
Chloride: 103 mEq/L (ref 96–112)
Creatinine, Ser: 1 mg/dL (ref 0.50–1.35)
Glucose, Bld: 95 mg/dL (ref 70–99)
Potassium: 4.3 mEq/L (ref 3.5–5.3)
Sodium: 140 mEq/L (ref 135–145)
Total Bilirubin: 0.5 mg/dL (ref 0.3–1.2)
Total Protein: 7.1 g/dL (ref 6.0–8.3)

## 2011-11-02 LAB — PSA: PSA: <0.01 ng/mL (ref <=4.00)

## 2011-11-02 LAB — TESTOSTERONE: Testosterone: 17.02 ng/dL — ABNORMAL LOW (ref 300–890)

## 2011-11-02 MED ORDER — DEGARELIX ACETATE 80 MG ~~LOC~~ SOLR
80.0000 mg | Freq: Once | SUBCUTANEOUS | Status: AC
  Administered 2011-11-02: 80 mg via SUBCUTANEOUS
  Filled 2011-11-02: qty 4

## 2011-11-02 MED ORDER — DENOSUMAB 120 MG/1.7ML ~~LOC~~ SOLN
120.0000 mg | Freq: Once | SUBCUTANEOUS | Status: AC
  Administered 2011-11-02: 120 mg via SUBCUTANEOUS
  Filled 2011-11-02: qty 1.7

## 2011-11-02 NOTE — Progress Notes (Signed)
This office note has been dictated.

## 2011-11-05 ENCOUNTER — Telehealth: Payer: Self-pay | Admitting: Hematology & Oncology

## 2011-11-05 ENCOUNTER — Telehealth: Payer: Self-pay | Admitting: *Deleted

## 2011-11-05 NOTE — Telephone Encounter (Addendum)
Message copied by Cathi Roan on Mon Nov 05, 2011  2:42 PM ------      Message from: Josph Macho      Created: Sat Nov 03, 2011 11:39 AM       Call - PSA still not detectable!!!  Cindee Lame  11-05-11  2:42pm   Called and spoke with patients wife, above MD message given to wife. States she would call back if any questions. Lupita Raider LPN

## 2011-11-05 NOTE — Progress Notes (Signed)
CC:   Michael Dubin, MD [facility], [place] [phone]  DIAGNOSIS:  Metastatic prostate cancer, hormone-responsive.  CURRENT THERAPY: 1. Casodex 50 mg p.o. daily. 2. Firmagon q. month. 3. Xgeva q. month.  INTERIM HISTORY:  [name] comes in for his followup.  He is doing quite well.  He is really not complaining of any pain.  He has been pretty active.  He has had some hot flashes and sweats.  We have done a good job in depleting his testosterone.  When we last saw him in September, his PSA was less than 0.01.  We did go ahead and do a bone scan on him.  This was done on 10/28/2011. The bone scan showed a decreased intensity within the right sacrum. This is indicative of a response.  No new bony lesions were noted.  He has had no cough or shortness of breath.  There has been no change in bowel or bladder habits.  He has had no headache.  PHYSICAL EXAMINATION:  General:  This is a well-developed, well- nourished white gentleman, in no obvious distress.  Vital signs: Temperature of 98.1, pulse 72, respiratory rate 18, blood pressure 138/65.  Weight is 165.  Head and neck:  Normocephalic, atraumatic skull.  There are no ocular or oral lesions.  There are no palpable cervical or supraclavicular lymph nodes.  Lungs:  Clear bilaterally. Cardiac:  Regular rate and rhythm, with a normal S1 and S2.  There are no murmurs, rubs, or bruits.  Abdomen:  Soft, with good bowel sounds. There is no palpable abdominal mass.  There is no fluid wave.  There is no palpable hepatosplenomegaly.  Extremities:  No clubbing, cyanosis or edema.  Back:  Shows no tenderness over the spine, ribs, or hips. Neurologic:  No focal neurological deficits.  LABORATORY STUDIES:  White cell count is 5.8, hemoglobin 14, hematocrit 40.6, platelet count 208,000.  IMPRESSION:  [name] is a 66 year old gentleman with metastatic prostate cancer.  He still would be considered hormone responsive.  He will go  ahead with his [place] today.  He will get the [facility] also.  We will plan to see him back in 2 months for ourselves.  He will come in in 1 month for the [place].  As always, we had great fellowship with [name].    ______________________________ Josph Macho, M.D. PRE/MEDQ  D:  11/02/2011  T:  11/02/2011  Job:  [id number]

## 2011-11-05 NOTE — Telephone Encounter (Addendum)
Message copied by Mirian Capuchin on Mon Nov 05, 2011 11:30 AM ------      Message from: Josph Macho      Created: Sat Nov 03, 2011 11:39 AM       Call - PSA still not detectable!!!  Cindee Lame This message given to pt.  Voiced understanding.

## 2011-11-30 ENCOUNTER — Ambulatory Visit (HOSPITAL_BASED_OUTPATIENT_CLINIC_OR_DEPARTMENT_OTHER): Payer: BC Managed Care – PPO

## 2011-11-30 VITALS — BP 128/80 | HR 73 | Temp 97.3°F | Resp 16

## 2011-11-30 DIAGNOSIS — C61 Malignant neoplasm of prostate: Secondary | ICD-10-CM

## 2011-11-30 DIAGNOSIS — Z5111 Encounter for antineoplastic chemotherapy: Secondary | ICD-10-CM

## 2011-11-30 DIAGNOSIS — C7951 Secondary malignant neoplasm of bone: Secondary | ICD-10-CM

## 2011-11-30 MED ORDER — DEGARELIX ACETATE 80 MG ~~LOC~~ SOLR
80.0000 mg | Freq: Once | SUBCUTANEOUS | Status: AC
  Administered 2011-11-30: 80 mg via SUBCUTANEOUS
  Filled 2011-11-30: qty 4

## 2011-11-30 MED ORDER — SODIUM CHLORIDE 0.9 % IV SOLN: Freq: Once | INTRAVENOUS | Status: DC | PRN

## 2011-11-30 MED ORDER — DENOSUMAB 120 MG/1.7ML ~~LOC~~ SOLN
120.0000 mg | Freq: Once | SUBCUTANEOUS | Status: AC
  Administered 2011-11-30: 120 mg via SUBCUTANEOUS
  Filled 2011-11-30: qty 1.7

## 2011-12-21 ENCOUNTER — Telehealth: Payer: Self-pay | Admitting: *Deleted

## 2011-12-21 ENCOUNTER — Other Ambulatory Visit: Payer: Self-pay | Admitting: Hematology & Oncology

## 2011-12-21 DIAGNOSIS — M25511 Pain in right shoulder: Secondary | ICD-10-CM

## 2011-12-21 DIAGNOSIS — M25512 Pain in left shoulder: Secondary | ICD-10-CM

## 2011-12-21 NOTE — Telephone Encounter (Signed)
Patient wife calling on behalf of husband stating that his shoulder pain has not improved over the last week.  Patient had called last week stating that left shoulder has pain and is painful to move.  Spoke with Dr. Myna Hidalgo.  He will put in orders for MRI according to Dr. Myna Hidalgo

## 2011-12-25 ENCOUNTER — Ambulatory Visit (HOSPITAL_BASED_OUTPATIENT_CLINIC_OR_DEPARTMENT_OTHER)
Admission: RE | Admit: 2011-12-25 | Discharge: 2011-12-25 | Disposition: A | Discharge status: Home / Self Care | Payer: BC Managed Care – PPO | Source: Ambulatory Visit | Attending: Hematology & Oncology | Admitting: Hematology & Oncology

## 2011-12-25 ENCOUNTER — Other Ambulatory Visit (HOSPITAL_BASED_OUTPATIENT_CLINIC_OR_DEPARTMENT_OTHER): Payer: BC Managed Care – PPO | Admitting: Lab

## 2011-12-25 DIAGNOSIS — M25519 Pain in unspecified shoulder: Secondary | ICD-10-CM | POA: Insufficient documentation

## 2011-12-25 DIAGNOSIS — M25511 Pain in right shoulder: Secondary | ICD-10-CM

## 2011-12-25 DIAGNOSIS — C61 Malignant neoplasm of prostate: Secondary | ICD-10-CM

## 2011-12-25 LAB — BASIC METABOLIC PANEL - CANCER CENTER ONLY
BUN, Bld: 13 mg/dL (ref 7–22)
CO2: 29 mEq/L (ref 18–33)
Calcium: 9.5 mg/dL (ref 8.0–10.3)
Chloride: 100 mEq/L (ref 98–108)
Creat: 0.9 mg/dl (ref 0.6–1.2)
Glucose, Bld: 125 mg/dL — ABNORMAL HIGH (ref 73–118)
Potassium: 3.9 mEq/L (ref 3.3–4.7)
Sodium: 143 mEq/L (ref 128–145)

## 2011-12-25 MED ORDER — GADOBENATE DIMEGLUMINE 529 MG/ML IV SOLN
15.0000 mL | Freq: Once | INTRAVENOUS | Status: AC | PRN
  Administered 2011-12-25: 15 mL via INTRAVENOUS

## 2011-12-28 ENCOUNTER — Ambulatory Visit (HOSPITAL_BASED_OUTPATIENT_CLINIC_OR_DEPARTMENT_OTHER): Payer: BC Managed Care – PPO

## 2011-12-28 ENCOUNTER — Other Ambulatory Visit (HOSPITAL_BASED_OUTPATIENT_CLINIC_OR_DEPARTMENT_OTHER): Payer: BC Managed Care – PPO | Admitting: Lab

## 2011-12-28 ENCOUNTER — Ambulatory Visit (HOSPITAL_BASED_OUTPATIENT_CLINIC_OR_DEPARTMENT_OTHER): Payer: BC Managed Care – PPO | Admitting: Hematology & Oncology

## 2011-12-28 VITALS — BP 142/73 | HR 73 | Temp 97.8°F | Resp 18 | Ht 67.0 in | Wt 170.0 lb

## 2011-12-28 DIAGNOSIS — C7951 Secondary malignant neoplasm of bone: Secondary | ICD-10-CM

## 2011-12-28 DIAGNOSIS — C7952 Secondary malignant neoplasm of bone marrow: Secondary | ICD-10-CM

## 2011-12-28 DIAGNOSIS — C61 Malignant neoplasm of prostate: Secondary | ICD-10-CM

## 2011-12-28 DIAGNOSIS — M25519 Pain in unspecified shoulder: Secondary | ICD-10-CM

## 2011-12-28 DIAGNOSIS — Z5111 Encounter for antineoplastic chemotherapy: Secondary | ICD-10-CM

## 2011-12-28 LAB — PSA: PSA: <0.01 ng/mL (ref <=4.00)

## 2011-12-28 LAB — COMPREHENSIVE METABOLIC PANEL
ALT: 11 U/L (ref 0–53)
AST: 15 U/L (ref 0–37)
Albumin: 4.7 g/dL (ref 3.5–5.2)
Alkaline Phosphatase: 62 U/L (ref 39–117)
BUN: 16 mg/dL (ref 6–23)
CO2: 28 mEq/L (ref 19–32)
Calcium: 9.6 mg/dL (ref 8.4–10.5)
Chloride: 103 mEq/L (ref 96–112)
Creatinine, Ser: 0.88 mg/dL (ref 0.50–1.35)
Glucose, Bld: 80 mg/dL (ref 70–99)
Potassium: 4.4 mEq/L (ref 3.5–5.3)
Sodium: 139 mEq/L (ref 135–145)
Total Bilirubin: 0.3 mg/dL (ref 0.3–1.2)
Total Protein: 7.2 g/dL (ref 6.0–8.3)

## 2011-12-28 LAB — CBC WITH DIFFERENTIAL (CANCER CENTER ONLY)
BASO#: 0 10*3/uL (ref 0.0–0.2)
BASO%: 0.5 % (ref 0.0–2.0)
EOS%: 2.2 % (ref 0.0–7.0)
Eosinophils Absolute: 0.1 10*3/uL (ref 0.0–0.5)
HCT: 42.7 % (ref 38.7–49.9)
HGB: 14.5 g/dL (ref 13.0–17.1)
LYMPH#: 1.7 10*3/uL (ref 0.9–3.3)
LYMPH%: 31.8 % (ref 14.0–48.0)
MCH: 31.5 pg (ref 28.0–33.4)
MCHC: 34 g/dL (ref 32.0–35.9)
MCV: 93 fL (ref 82–98)
MONO#: 0.5 10*3/uL (ref 0.1–0.9)
MONO%: 9.5 % (ref 0.0–13.0)
NEUT#: 3.1 10*3/uL (ref 1.5–6.5)
NEUT%: 56 % (ref 40.0–80.0)
Platelets: 224 10*3/uL (ref 145–400)
RBC: 4.6 10*6/uL (ref 4.20–5.70)
RDW: 12.3 % (ref 11.1–15.7)
WBC: 5.5 10*3/uL (ref 4.0–10.0)

## 2011-12-28 LAB — TESTOSTERONE: Testosterone: 20.8 ng/dL — ABNORMAL LOW (ref 300–890)

## 2011-12-28 MED ORDER — DENOSUMAB 120 MG/1.7ML ~~LOC~~ SOLN
120.0000 mg | Freq: Once | SUBCUTANEOUS | Status: AC
  Administered 2011-12-28: 120 mg via SUBCUTANEOUS
  Filled 2011-12-28: qty 1.7

## 2011-12-28 MED ORDER — DEGARELIX ACETATE 80 MG ~~LOC~~ SOLR
80.0000 mg | Freq: Once | SUBCUTANEOUS | Status: AC
  Administered 2011-12-28: 80 mg via SUBCUTANEOUS
  Filled 2011-12-28: qty 4

## 2011-12-28 NOTE — Progress Notes (Signed)
This office note has been dictated.

## 2011-12-28 NOTE — Patient Instructions (Signed)
Degarelix injection What is this medicine? DEGARELIX (deg a REL ix) is used to treat men with advanced prostate cancer. This medicine may be used for other purposes; ask your health care provider or pharmacist if you have questions. What should I tell my health care provider before I take this medicine? They need to know if you have any of these conditions: -heart disease -kidney disease -liver disease -low levels of potassium or magnesium in the blood -osteoporosis -an unusual or allergic reaction to degarelix, mannitol, other medicines, foods, dyes, or preservatives -pregnant or trying to get pregnant -breast-feeding How should I use this medicine? This medicine is for injection under the skin. It is usually given by a health care professional in a hospital or clinic setting. If you get this medicine at home, you will be taught how to prepare and give this medicine. Use exactly as directed. Take your medicine at regular intervals. Do not take it more often than directed. It is important that you put your used needles and syringes in a special sharps container. Do not put them in a trash can. If you do not have a sharps container, call your pharmacist or healthcare provider to get one. Talk to your pediatrician regarding the use of this medicine in children. Special care may be needed. Overdosage: If you think you've taken too much of this medicine contact a poison control center or emergency room at once. Overdosage: If you think you have taken too much of this medicine contact a poison control center or emergency room at once. NOTE: This medicine is only for you. Do not share this medicine with others. What if I miss a dose? Try not to miss a dose. If you do miss a dose, call your doctor or health care professional for advice. What may interact with this medicine? Do not take this medicine with any of the following  medications: -amiodarone -bretylium -disopyramide -dofetilide -droperidol -ibutilide -procainamide -quinidine -sotalol This list may not describe all possible interactions. Give your health care provider a list of all the medicines, herbs, non-prescription drugs, or dietary supplements you use. Also tell them if you smoke, drink alcohol, or use illegal drugs. Some items may interact with your medicine. What should I watch for while using this medicine? Visit your doctor or health care professional for regular checks on your progress and discuss any issues before you start taking this medicine. Your doctor or health care professional will need to monitor your hormone levels in your blood to check your response to treatment. Try to keep any appointments for testing. What side effects may I notice from receiving this medicine? Side effects that you should report to your doctor or health care professional as soon as possible: -allergic reactions like skin rash, itching or hives, swelling of the face, lips, or tongue -fever or chills -irregular heartbeat -nausea and vomiting along with severe abdominal pain -pain or difficulty passing urine -pelvic pain or bloating Side effects that usually do not require medical attention (Report these to your doctor or health care professional if they continue or are bothersome.): -change in sex drive or performance -constipation -headache -high blood pressure - hot flashes (flushing of skin, increased sweating) - itching, redness or mild pain at site where injected -joint pain -trouble sleeping -unusually weak or tired -weight gain This list may not describe all possible side effects. Call your doctor for medical advice about side effects. You may report side effects to FDA at 1-800-FDA-1088. Where should I keep my   medicine? Keep out of the reach of children. This drug is usually given in a hospital or clinic and will not be stored at home. In rare  cases, this medicine may be given at home. If you are using this medicine at home, you will be instructed on how to store this medicine. Throw away any unused medicine after the expiration date on the label. NOTE: This sheet is a summary. It may not cover all possible information. If you have questions about this medicine, talk to your doctor, pharmacist, or health care provider.  2013, Elsevier/Gold Standard. (05/27/2007 4:41:07 PM)   Denosumab injection What is this medicine? DENOSUMAB slows bone breakdown. It is used to treat osteoporosis in women after menopause. This medicine is also used to prevent bone fractures and other bone problems caused by cancer bone metastases. This medicine may be used for other purposes; ask your health care provider or pharmacist if you have questions. What should I tell my health care provider before I take this medicine? They need to know if you have any of these conditions: -dental disease -eczema -infection or history of infections -kidney disease or on dialysis -low blood calcium or vitamin D -malabsorption syndrome -scheduled to have surgery or tooth extraction -taking medicine that contains denosumab -thyroid or parathyroid disease -an unusual reaction to denosumab, other medicines, foods, dyes, or preservatives -pregnant or trying to get pregnant -breast-feeding How should I use this medicine? This medicine is for injection under the skin. It is given by a health care professional in a hospital or clinic setting. If you are getting Prolia, a special MedGuide will be given to you by the pharmacist with each prescription and refill. Be sure to read this information carefully each time. Talk to your pediatrician regarding the use of this medicine in children. Special care may be needed. Overdosage: If you think you've taken too much of this medicine contact a poison control center or emergency room at once. Overdosage: If you think you have taken  too much of this medicine contact a poison control center or emergency room at once. NOTE: This medicine is only for you. Do not share this medicine with others. What if I miss a dose? It is important not to miss your dose. Call your doctor or health care professional if you are unable to keep an appointment. What may interact with this medicine? Do not take this medicine with any of the following medications: -other medicines containing denosumab This medicine may also interact with the following medications: -medicines that suppress the immune system -medicines that treat cancer -steroid medicines like prednisone or cortisone This list may not describe all possible interactions. Give your health care provider a list of all the medicines, herbs, non-prescription drugs, or dietary supplements you use. Also tell them if you smoke, drink alcohol, or use illegal drugs. Some items may interact with your medicine. What should I watch for while using this medicine? Visit your doctor or health care professional for regular checks on your progress. Your doctor or health care professional may order blood tests and other tests to see how you are doing. Call your doctor or health care professional if you get a cold or other infection while receiving this medicine. Do not treat yourself. This medicine may decrease your body's ability to fight infection. You should make sure you get enough calcium and vitamin D while you are taking this medicine, unless your doctor tells you not to. Discuss the foods you eat and the vitamins   you take with your health care professional. See your dentist regularly. Brush and floss your teeth as directed. Before you have any dental work done, tell your dentist you are receiving this medicine. What side effects may I notice from receiving this medicine? Side effects that you should report to your doctor or health care professional as soon as possible: -allergic reactions like skin  rash, itching or hives, swelling of the face, lips, or tongue -breathing problems -chest pain -fast, irregular heartbeat -feeling faint or lightheaded, falls -fever, chills, or any other sign of infection -muscle spasms, tightening, or twitches -numbness or tingling -skin blisters or bumps, or is dry, peels, or red -slow healing or unexplained pain in the mouth or jaw -unusual bleeding or bruising Side effects that usually do not require medical attention (Report these to your doctor or health care professional if they continue or are bothersome.): -muscle pain -stomach upset, gas This list may not describe all possible side effects. Call your doctor for medical advice about side effects. You may report side effects to FDA at 1-800-FDA-1088. Where should I keep my medicine? This medicine is only given in a clinic, doctor's office, or other health care setting and will not be stored at home. NOTE: This sheet is a summary. It may not cover all possible information. If you have questions about this medicine, talk to your doctor, pharmacist, or health care provider.  2012, Elsevier/Gold Standard. (08/24/2009 2:32:46 PM)     

## 2011-12-31 ENCOUNTER — Telehealth: Payer: Self-pay | Admitting: *Deleted

## 2011-12-31 NOTE — Telephone Encounter (Signed)
Message copied by Anselm Jungling on Mon Dec 31, 2011  1:52 PM ------      Message from: Josph Macho      Created: Sat Dec 29, 2011 12:16 PM       Call - PSA still not detectable!!!  pete

## 2011-12-31 NOTE — Progress Notes (Signed)
CC:   [name]. [name], M.D. Michael Dubin, MD [name], [place] [phone]  DIAGNOSIS:  Metastatic hormone-responsive prostate cancer.  CURRENT THERAPY: 1. Casodex 50 mg p.o. daily. 2. [name] monthly. 3. Xgeva monthly.  INTERIM HISTORY:  [name] comes in for followup.  He is doing well, although his right shoulder is a real problem now.  We did an MRI of the right shoulder.  This, unfortunately, showed a complete tear of the supraspinatus tendon. I spoke with Dr. Marcene Sellers of Orthopedic Surgery.  He will see [name] and get his rotator cuff fixed.  [name] has had absolutely no problems with his prostate cancer.  His last PSA was less than 0.1.  He is castrate-level testosterone level.  His last bone scan showed stable disease with an improved activity in the right sacrum.  He has had no problem with bowels or bladder.  He has had no leg swelling.  He has had no rashes.  PHYSICAL EXAMINATION:  General:  This is a well-developed, well- nourished white gentleman in no obvious distress.  Vital signs: Temperature of 97.8, pulse 73, respiratory rate 18, blood pressure 142/73.  Weight is 170.  Head and neck:  Normocephalic, atraumatic skull.  There are no ocular or oral lesions.  There are no palpable cervical or supraclavicular lymph nodes.  Lungs:  Clear bilaterally. Cardiac:  Regular rate and rhythm with a normal S1 and S2.  There are no murmurs, rubs, or bruits.  Abdomen:  Soft with good bowel sounds.  There is no palpable abdominal mass.  There is no palpable hepatosplenomegaly. Extremities:  No clubbing, cyanosis, or edema.  He does have marked decreased range of motion of the right shoulder.  LABORATORY STUDIES:  White cell count 5.5, hemoglobin 14.5, hematocrit 42.7, platelet count 224.  IMPRESSION:  [name] is a 66 year old gentleman with metastatic prostate cancer.  Again, he is hormone responsive to-date.  We will go ahead with his  [place] and Casodex and Xgeva.  He has done very well with this.  Again, I see no problems with him having rotator cuff surgery, if that is what [name] feels is indicated.  I will plan see [name] back in another month for followup.  We probably will do another set of scans on him in February.    ______________________________ [name], M.D. PRE/MEDQ  D:  12/28/2011  T:  12/29/2011  Job:  [id number]

## 2011-12-31 NOTE — Telephone Encounter (Signed)
Called patient to let him know that his PSA was still not detectable per dr. Myna Hidalgo

## 2012-01-07 ENCOUNTER — Other Ambulatory Visit: Payer: Self-pay | Admitting: Hematology & Oncology

## 2012-01-25 ENCOUNTER — Ambulatory Visit: Payer: BC Managed Care – PPO

## 2012-01-28 ENCOUNTER — Ambulatory Visit (HOSPITAL_BASED_OUTPATIENT_CLINIC_OR_DEPARTMENT_OTHER): Payer: BC Managed Care – PPO | Admitting: Hematology & Oncology

## 2012-01-28 ENCOUNTER — Other Ambulatory Visit (HOSPITAL_BASED_OUTPATIENT_CLINIC_OR_DEPARTMENT_OTHER): Payer: BC Managed Care – PPO | Admitting: Lab

## 2012-01-28 ENCOUNTER — Ambulatory Visit (HOSPITAL_BASED_OUTPATIENT_CLINIC_OR_DEPARTMENT_OTHER): Payer: BC Managed Care – PPO

## 2012-01-28 ENCOUNTER — Other Ambulatory Visit: Payer: BC Managed Care – PPO | Admitting: Lab

## 2012-01-28 ENCOUNTER — Ambulatory Visit: Payer: BC Managed Care – PPO | Admitting: Hematology & Oncology

## 2012-01-28 VITALS — BP 137/77 | HR 81 | Temp 97.9°F | Resp 18 | Ht 67.0 in | Wt 166.0 lb

## 2012-01-28 DIAGNOSIS — C61 Malignant neoplasm of prostate: Secondary | ICD-10-CM

## 2012-01-28 DIAGNOSIS — C7951 Secondary malignant neoplasm of bone: Secondary | ICD-10-CM

## 2012-01-28 DIAGNOSIS — Z5111 Encounter for antineoplastic chemotherapy: Secondary | ICD-10-CM

## 2012-01-28 DIAGNOSIS — C7952 Secondary malignant neoplasm of bone marrow: Secondary | ICD-10-CM

## 2012-01-28 LAB — CBC WITH DIFFERENTIAL (CANCER CENTER ONLY)
BASO#: 0 10*3/uL (ref 0.0–0.2)
BASO%: 0.5 % (ref 0.0–2.0)
EOS%: 2.1 % (ref 0.0–7.0)
Eosinophils Absolute: 0.1 10*3/uL (ref 0.0–0.5)
HCT: 42.7 % (ref 38.7–49.9)
HGB: 14.3 g/dL (ref 13.0–17.1)
LYMPH#: 1.9 10*3/uL (ref 0.9–3.3)
LYMPH%: 29.6 % (ref 14.0–48.0)
MCH: 31.3 pg (ref 28.0–33.4)
MCHC: 33.5 g/dL (ref 32.0–35.9)
MCV: 93 fL (ref 82–98)
MONO#: 0.5 10*3/uL (ref 0.1–0.9)
MONO%: 8.5 % (ref 0.0–13.0)
NEUT#: 3.7 10*3/uL (ref 1.5–6.5)
NEUT%: 59.3 % (ref 40.0–80.0)
Platelets: 220 10*3/uL (ref 145–400)
RBC: 4.57 10*6/uL (ref 4.20–5.70)
RDW: 12.4 % (ref 11.1–15.7)
WBC: 6.3 10*3/uL (ref 4.0–10.0)

## 2012-01-28 LAB — COMPREHENSIVE METABOLIC PANEL
ALT: 11 U/L (ref 0–53)
AST: 13 U/L (ref 0–37)
Albumin: 4.6 g/dL (ref 3.5–5.2)
Alkaline Phosphatase: 62 U/L (ref 39–117)
BUN: 15 mg/dL (ref 6–23)
CO2: 29 mEq/L (ref 19–32)
Calcium: 9.7 mg/dL (ref 8.4–10.5)
Chloride: 99 mEq/L (ref 96–112)
Creatinine, Ser: 0.96 mg/dL (ref 0.50–1.35)
Glucose, Bld: 98 mg/dL (ref 70–99)
Potassium: 4 mEq/L (ref 3.5–5.3)
Sodium: 136 mEq/L (ref 135–145)
Total Bilirubin: 0.4 mg/dL (ref 0.3–1.2)
Total Protein: 7.2 g/dL (ref 6.0–8.3)

## 2012-01-28 LAB — PSA: PSA: 0.03 ng/mL (ref <=4.00)

## 2012-01-28 LAB — TESTOSTERONE: Testosterone: 16.19 ng/dL — ABNORMAL LOW (ref 300–890)

## 2012-01-28 MED ORDER — ALBUTEROL SULFATE (2.5 MG/3ML) 0.083% IN NEBU
2.5000 mg | INHALATION_SOLUTION | Freq: Once | RESPIRATORY_TRACT | Status: DC | PRN
  Filled 2012-01-28: qty 3

## 2012-01-28 MED ORDER — DEGARELIX ACETATE 80 MG ~~LOC~~ SOLR
80.0000 mg | Freq: Once | SUBCUTANEOUS | Status: AC
  Administered 2012-01-28: 80 mg via SUBCUTANEOUS
  Filled 2012-01-28: qty 4

## 2012-01-28 MED ORDER — DIPHENHYDRAMINE HCL 50 MG/ML IJ SOLN: 50.0000 mg | Freq: Once | INTRAMUSCULAR | Status: DC | PRN

## 2012-01-28 MED ORDER — SODIUM CHLORIDE 0.9 % IV SOLN: Freq: Once | INTRAVENOUS | Status: DC | PRN

## 2012-01-28 MED ORDER — DIPHENHYDRAMINE HCL 50 MG/ML IJ SOLN: 25.0000 mg | Freq: Once | INTRAMUSCULAR | Status: DC | PRN

## 2012-01-28 MED ORDER — EPINEPHRINE HCL 0.1 MG/ML IJ SOLN
0.2500 mg | Freq: Once | INTRAMUSCULAR | Status: DC | PRN
  Filled 2012-01-28: qty 10

## 2012-01-28 MED ORDER — METHYLPREDNISOLONE SODIUM SUCC 125 MG IJ SOLR: 125.0000 mg | Freq: Once | INTRAMUSCULAR | Status: DC | PRN

## 2012-01-28 MED ORDER — DENOSUMAB 120 MG/1.7ML ~~LOC~~ SOLN
120.0000 mg | Freq: Once | SUBCUTANEOUS | Status: AC
  Administered 2012-01-28: 120 mg via SUBCUTANEOUS
  Filled 2012-01-28: qty 1.7

## 2012-01-28 NOTE — Patient Instructions (Signed)
Degarelix injection What is this medicine? DEGARELIX (deg a REL ix) is used to treat men with advanced prostate cancer. This medicine may be used for other purposes; ask your health care provider or pharmacist if you have questions. What should I tell my health care provider before I take this medicine? They need to know if you have any of these conditions: -heart disease -kidney disease -liver disease -low levels of potassium or magnesium in the blood -osteoporosis -an unusual or allergic reaction to degarelix, mannitol, other medicines, foods, dyes, or preservatives -pregnant or trying to get pregnant -breast-feeding How should I use this medicine? This medicine is for injection under the skin. It is usually given by a health care professional in a hospital or clinic setting. If you get this medicine at home, you will be taught how to prepare and give this medicine. Use exactly as directed. Take your medicine at regular intervals. Do not take it more often than directed. It is important that you put your used needles and syringes in a special sharps container. Do not put them in a trash can. If you do not have a sharps container, call your pharmacist or healthcare provider to get one. Talk to your pediatrician regarding the use of this medicine in children. Special care may be needed. Overdosage: If you think you've taken too much of this medicine contact a poison control center or emergency room at once. Overdosage: If you think you have taken too much of this medicine contact a poison control center or emergency room at once. NOTE: This medicine is only for you. Do not share this medicine with others. What if I miss a dose? Try not to miss a dose. If you do miss a dose, call your doctor or health care professional for advice. What may interact with this medicine? Do not take this medicine with any of the following  medications: -amiodarone -bretylium -disopyramide -dofetilide -droperidol -ibutilide -procainamide -quinidine -sotalol This list may not describe all possible interactions. Give your health care provider a list of all the medicines, herbs, non-prescription drugs, or dietary supplements you use. Also tell them if you smoke, drink alcohol, or use illegal drugs. Some items may interact with your medicine. What should I watch for while using this medicine? Visit your doctor or health care professional for regular checks on your progress and discuss any issues before you start taking this medicine. Your doctor or health care professional will need to monitor your hormone levels in your blood to check your response to treatment. Try to keep any appointments for testing. What side effects may I notice from receiving this medicine? Side effects that you should report to your doctor or health care professional as soon as possible: -allergic reactions like skin rash, itching or hives, swelling of the face, lips, or tongue -fever or chills -irregular heartbeat -nausea and vomiting along with severe abdominal pain -pain or difficulty passing urine -pelvic pain or bloating Side effects that usually do not require medical attention (Report these to your doctor or health care professional if they continue or are bothersome.): -change in sex drive or performance -constipation -headache -high blood pressure - hot flashes (flushing of skin, increased sweating) - itching, redness or mild pain at site where injected -joint pain -trouble sleeping -unusually weak or tired -weight gain This list may not describe all possible side effects. Call your doctor for medical advice about side effects. You may report side effects to FDA at 1-800-FDA-1088. Where should I keep my  medicine? Keep out of the reach of children. This drug is usually given in a hospital or clinic and will not be stored at home. In rare  cases, this medicine may be given at home. If you are using this medicine at home, you will be instructed on how to store this medicine. Throw away any unused medicine after the expiration date on the label. NOTE: This sheet is a summary. It may not cover all possible information. If you have questions about this medicine, talk to your doctor, pharmacist, or health care provider.  2013, Elsevier/Gold Standard. (05/27/2007 4:41:07 PM)   Denosumab injection What is this medicine? DENOSUMAB slows bone breakdown. It is used to treat osteoporosis in women after menopause. This medicine is also used to prevent bone fractures and other bone problems caused by cancer bone metastases. This medicine may be used for other purposes; ask your health care provider or pharmacist if you have questions. What should I tell my health care provider before I take this medicine? They need to know if you have any of these conditions: -dental disease -eczema -infection or history of infections -kidney disease or on dialysis -low blood calcium or vitamin D -malabsorption syndrome -scheduled to have surgery or tooth extraction -taking medicine that contains denosumab -thyroid or parathyroid disease -an unusual reaction to denosumab, other medicines, foods, dyes, or preservatives -pregnant or trying to get pregnant -breast-feeding How should I use this medicine? This medicine is for injection under the skin. It is given by a health care professional in a hospital or clinic setting. If you are getting Prolia, a special MedGuide will be given to you by the pharmacist with each prescription and refill. Be sure to read this information carefully each time. Talk to your pediatrician regarding the use of this medicine in children. Special care may be needed. Overdosage: If you think you've taken too much of this medicine contact a poison control center or emergency room at once. Overdosage: If you think you have taken  too much of this medicine contact a poison control center or emergency room at once. NOTE: This medicine is only for you. Do not share this medicine with others. What if I miss a dose? It is important not to miss your dose. Call your doctor or health care professional if you are unable to keep an appointment. What may interact with this medicine? Do not take this medicine with any of the following medications: -other medicines containing denosumab This medicine may also interact with the following medications: -medicines that suppress the immune system -medicines that treat cancer -steroid medicines like prednisone or cortisone This list may not describe all possible interactions. Give your health care provider a list of all the medicines, herbs, non-prescription drugs, or dietary supplements you use. Also tell them if you smoke, drink alcohol, or use illegal drugs. Some items may interact with your medicine. What should I watch for while using this medicine? Visit your doctor or health care professional for regular checks on your progress. Your doctor or health care professional may order blood tests and other tests to see how you are doing. Call your doctor or health care professional if you get a cold or other infection while receiving this medicine. Do not treat yourself. This medicine may decrease your body's ability to fight infection. You should make sure you get enough calcium and vitamin D while you are taking this medicine, unless your doctor tells you not to. Discuss the foods you eat and the vitamins  you take with your health care professional. See your dentist regularly. Brush and floss your teeth as directed. Before you have any dental work done, tell your dentist you are receiving this medicine. What side effects may I notice from receiving this medicine? Side effects that you should report to your doctor or health care professional as soon as possible: -allergic reactions like skin  rash, itching or hives, swelling of the face, lips, or tongue -breathing problems -chest pain -fast, irregular heartbeat -feeling faint or lightheaded, falls -fever, chills, or any other sign of infection -muscle spasms, tightening, or twitches -numbness or tingling -skin blisters or bumps, or is dry, peels, or red -slow healing or unexplained pain in the mouth or jaw -unusual bleeding or bruising Side effects that usually do not require medical attention (Report these to your doctor or health care professional if they continue or are bothersome.): -muscle pain -stomach upset, gas This list may not describe all possible side effects. Call your doctor for medical advice about side effects. You may report side effects to FDA at 1-800-FDA-1088. Where should I keep my medicine? This medicine is only given in a clinic, doctor's office, or other health care setting and will not be stored at home. NOTE: This sheet is a summary. It may not cover all possible information. If you have questions about this medicine, talk to your doctor, pharmacist, or health care provider.  2012, Elsevier/Gold Standard. (08/24/2009 2:32:46 PM)

## 2012-01-28 NOTE — Progress Notes (Signed)
This office note has been dictated.

## 2012-01-30 NOTE — Progress Notes (Signed)
CC:   [name]. [name], M.D. Michael Dubin, MD [facility], [place] [phone]  DIAGNOSIS:  Metastatic prostate cancer, castrate responsive.  CURRENT THERAPY: 1. Casodex 50 mg p.o. daily. 2. [name] monthly. 3. Xgeva monthly.  INTERIM HISTORY:  [name] comes in for followup.  He did see Dr. Jerl Sellers.  Dr. Jerl Sellers went ahead and gave him an injection to his right shoulder for the supraspinatus tendon tear.  As such, [name] hopefully will not need to have surgery.  He says that the shoulder feels better.  His prostate cancer has not been an issue.  His PSA has consistently remained less than 0.01.  His testosterone levels have also been very low.  He has had problems sleeping.  He is taking some melatonin.  He has had no fevers, sweats or chills.  His appetite has been quite good.  PHYSICAL EXAMINATION:  General:  This is a well-developed, well- nourished white gentleman in no obvious distress.  Vital signs:  Show temperature 97.9, pulse 81, respiratory rate 18, blood pressure 137/77. Weight is 166.  Head and neck:  Shows a normocephalic, atraumatic skull. There are no ocular or oral lesions.  There are no palpable cervical or supraclavicular lymph nodes.  Lungs:  Clear bilaterally.  Cardiac: Regular rate and rhythm with a normal S1 and S2.  There are no murmurs, rubs or bruits.  Abdomen:  Soft with good bowel sounds.  There is no palpable abdominal mass.  There is no fluid wave.  No palpable hepatosplenomegaly  back:  No tenderness over the spine, ribs or hips. Extremities:  Show no clubbing, cyanosis or edema.  He has much better range of motion of his right shoulder.  LABORATORY STUDIES:  White cell count is 6.3, hemoglobin 14.3, hematocrit 42.7, platelet count 220.  IMPRESSION:  [name] is a 67 year old gentleman with metastatic prostate cancer.  Again, he is still hormone responsive.  I really believe that as long as his PSA is nondetectable, that  we do not need to put him through scans.  We will go ahead and plan to get him back in 1 more month.  Again, unless his PSA goes up or he starts to have unusual symptoms, we can avoid putting him through scans.    ______________________________ [name], M.D. PRE/MEDQ  D:  01/28/2012  T:  01/29/2012  Job:  [id number]

## 2012-01-31 ENCOUNTER — Telehealth: Payer: Self-pay | Admitting: *Deleted

## 2012-01-31 NOTE — Telephone Encounter (Signed)
Called patient to let him know that his PSA is still very low per dr. Myna Hidalgo

## 2012-01-31 NOTE — Telephone Encounter (Signed)
Message copied by Anselm Jungling on Thu Jan 31, 2012 10:35 AM ------      Message from: Arlan Organ R      Created: Wed Jan 30, 2012  7:22 AM       Call :  PSA is still very low!!  Cindee Lame

## 2012-02-22 ENCOUNTER — Ambulatory Visit: Payer: BC Managed Care – PPO | Admitting: Hematology & Oncology

## 2012-02-22 ENCOUNTER — Ambulatory Visit: Payer: BC Managed Care – PPO

## 2012-02-22 ENCOUNTER — Other Ambulatory Visit: Payer: BC Managed Care – PPO | Admitting: Lab

## 2012-02-25 ENCOUNTER — Other Ambulatory Visit: Payer: Self-pay | Admitting: Hematology & Oncology

## 2012-02-29 ENCOUNTER — Ambulatory Visit: Payer: BC Managed Care – PPO | Admitting: Hematology & Oncology

## 2012-02-29 ENCOUNTER — Ambulatory Visit: Payer: BC Managed Care – PPO

## 2012-02-29 ENCOUNTER — Other Ambulatory Visit: Payer: BC Managed Care – PPO | Admitting: Lab

## 2012-03-04 ENCOUNTER — Ambulatory Visit (HOSPITAL_BASED_OUTPATIENT_CLINIC_OR_DEPARTMENT_OTHER): Payer: BC Managed Care – PPO | Admitting: Hematology & Oncology

## 2012-03-04 ENCOUNTER — Ambulatory Visit (HOSPITAL_BASED_OUTPATIENT_CLINIC_OR_DEPARTMENT_OTHER): Payer: BC Managed Care – PPO

## 2012-03-04 ENCOUNTER — Other Ambulatory Visit: Payer: BC Managed Care – PPO | Admitting: Lab

## 2012-03-04 VITALS — BP 124/66 | HR 78 | Temp 97.8°F | Resp 18 | Ht 67.0 in | Wt 167.0 lb

## 2012-03-04 DIAGNOSIS — C7952 Secondary malignant neoplasm of bone marrow: Secondary | ICD-10-CM

## 2012-03-04 DIAGNOSIS — C61 Malignant neoplasm of prostate: Secondary | ICD-10-CM

## 2012-03-04 DIAGNOSIS — C7951 Secondary malignant neoplasm of bone: Secondary | ICD-10-CM

## 2012-03-04 DIAGNOSIS — G4701 Insomnia due to medical condition: Secondary | ICD-10-CM

## 2012-03-04 DIAGNOSIS — Z5111 Encounter for antineoplastic chemotherapy: Secondary | ICD-10-CM

## 2012-03-04 LAB — CBC WITH DIFFERENTIAL (CANCER CENTER ONLY)
BASO#: 0 10*3/uL (ref 0.0–0.2)
BASO%: 0.2 % (ref 0.0–2.0)
EOS%: 3 % (ref 0.0–7.0)
Eosinophils Absolute: 0.2 10*3/uL (ref 0.0–0.5)
HCT: 42.1 % (ref 38.7–49.9)
HGB: 14.1 g/dL (ref 13.0–17.1)
LYMPH#: 1.5 10*3/uL (ref 0.9–3.3)
LYMPH%: 29.6 % (ref 14.0–48.0)
MCH: 31.1 pg (ref 28.0–33.4)
MCHC: 33.5 g/dL (ref 32.0–35.9)
MCV: 93 fL (ref 82–98)
MONO#: 0.4 10*3/uL (ref 0.1–0.9)
MONO%: 8.4 % (ref 0.0–13.0)
NEUT#: 2.9 10*3/uL (ref 1.5–6.5)
NEUT%: 58.8 % (ref 40.0–80.0)
Platelets: 214 10*3/uL (ref 145–400)
RBC: 4.54 10*6/uL (ref 4.20–5.70)
RDW: 12.6 % (ref 11.1–15.7)
WBC: 5 10*3/uL (ref 4.0–10.0)

## 2012-03-04 LAB — CMP (CANCER CENTER ONLY)
ALT(SGPT): 17 U/L (ref 10–47)
AST: 19 U/L (ref 11–38)
Albumin: 3.8 g/dL (ref 3.3–5.5)
Alkaline Phosphatase: 69 U/L (ref 26–84)
BUN, Bld: 13 mg/dL (ref 7–22)
CO2: 28 mEq/L (ref 18–33)
Calcium: 9.4 mg/dL (ref 8.0–10.3)
Chloride: 102 mEq/L (ref 98–108)
Creat: 0.9 mg/dl (ref 0.6–1.2)
Glucose, Bld: 126 mg/dL — ABNORMAL HIGH (ref 73–118)
Potassium: 4.2 mEq/L (ref 3.3–4.7)
Sodium: 142 mEq/L (ref 128–145)
Total Bilirubin: 0.8 mg/dl (ref 0.20–1.60)
Total Protein: 7.8 g/dL (ref 6.4–8.1)

## 2012-03-04 LAB — PSA: PSA: 0.06 ng/mL (ref <=4.00)

## 2012-03-04 MED ORDER — DEGARELIX ACETATE 80 MG ~~LOC~~ SOLR
80.0000 mg | Freq: Once | SUBCUTANEOUS | Status: AC
  Administered 2012-03-04: 80 mg via SUBCUTANEOUS
  Filled 2012-03-04: qty 4

## 2012-03-04 MED ORDER — DENOSUMAB 120 MG/1.7ML ~~LOC~~ SOLN
120.0000 mg | Freq: Once | SUBCUTANEOUS | Status: AC
  Administered 2012-03-04: 120 mg via SUBCUTANEOUS
  Filled 2012-03-04: qty 1.7

## 2012-03-04 MED ORDER — TRAZODONE HCL 50 MG PO TABS: 50.0000 mg | ORAL_TABLET | Freq: Every day | ORAL | Status: DC

## 2012-03-04 NOTE — Progress Notes (Signed)
This office note has been dictated.

## 2012-03-05 NOTE — Progress Notes (Signed)
CC:   [name]. [name], M.D. Michael Dubin, MD [facility], [place] [phone]  DIAGNOSIS:  Metastatic prostate cancer, hormone-responsive.  CURRENT THERAPY: 1. Firmagon 80 mg subcu q. month. 2. Xgeva 120 mg subcu q. month. 3. Casodex 50 mg p.o. daily.  INTERIM HISTORY:  [name] comes in for his followup.  He is doing okay.  His right shoulder is doing much better.  He received a cortisone injection by Dr. Jerl Sellers which has helped him quite a bit.  He is having problems with Ambien.  His wife says he is up quite a bit and singing at nighttime.  [name] is not aware of this.  So, as such, we will change this.  I think Trazodone (50 mg p.o. q.h.s.) will help.  His last PSA was 0.03.  We need to continue to watch this.  He has had no pain issues.  He says that his lower back is a little bit "weak."  He is walking a mile or so a day.  PHYSICAL EXAMINATION:  This is a well-developed, well-nourished white gentleman in no obvious distress.  Vital signs:  Temperature of 97.8, pulse 78, respiratory rate 18, blood pressure 124/66.  Weight is 167. Head and Neck:  Normocephalic, atraumatic skull.  There are no ocular or oral lesions.  There are no palpable cervical or supraclavicular lymph nodes.  Lungs:  Clear bilaterally.  Cardiac:  Regular rate and rhythm with a normal S1, S2.  There are no murmurs, rubs or bruits.  Abdomen: Soft with good bowel sounds.  There is no palpable abdominal mass. There is no fluid wave.  No palpable hepatosplenomegaly.  Extremities: Shows no clubbing, cyanosis or edema.  Neurological:  Shows no focal neurological deficits.  LABORATORY STUDIES:  White cell count is 5, hemoglobin 14.1, hematocrit 42.1, platelet count 214.  IMPRESSION:  [name] is a 67 year old gentleman with metastatic prostate cancer.  He has done very, very well.  His disease apparently has been pretty much stable.  We will see how his PSA is.  If we find that is PSA is  going up, then we are going to have to repeat his scans so we can see how things look.  We will go ahead and plan to get him back to see [place] in another month or so.    ______________________________ [name], M.D. PRE/MEDQ  D:  03/04/2012  T:  03/04/2012  Job:  [id number]

## 2012-03-26 ENCOUNTER — Ambulatory Visit: Payer: BC Managed Care – PPO

## 2012-03-26 ENCOUNTER — Other Ambulatory Visit (HOSPITAL_BASED_OUTPATIENT_CLINIC_OR_DEPARTMENT_OTHER): Payer: BC Managed Care – PPO | Admitting: Lab

## 2012-03-26 ENCOUNTER — Ambulatory Visit (HOSPITAL_BASED_OUTPATIENT_CLINIC_OR_DEPARTMENT_OTHER): Payer: BC Managed Care – PPO | Admitting: Oncology

## 2012-03-26 VITALS — BP 134/70 | HR 86 | Temp 97.4°F | Resp 18 | Ht 67.0 in | Wt 168.0 lb

## 2012-03-26 DIAGNOSIS — C61 Malignant neoplasm of prostate: Secondary | ICD-10-CM

## 2012-03-26 DIAGNOSIS — C7951 Secondary malignant neoplasm of bone: Secondary | ICD-10-CM

## 2012-03-26 LAB — CMP (CANCER CENTER ONLY)
ALT(SGPT): 16 U/L (ref 10–47)
AST: 19 U/L (ref 11–38)
Albumin: 3.8 g/dL (ref 3.3–5.5)
Alkaline Phosphatase: 60 U/L (ref 26–84)
BUN, Bld: 13 mg/dL (ref 7–22)
CO2: 29 mEq/L (ref 18–33)
Calcium: 9.2 mg/dL (ref 8.0–10.3)
Chloride: 101 mEq/L (ref 98–108)
Creat: 0.9 mg/dl (ref 0.6–1.2)
Glucose, Bld: 111 mg/dL (ref 73–118)
Potassium: 3.9 mEq/L (ref 3.3–4.7)
Sodium: 140 mEq/L (ref 128–145)
Total Bilirubin: 0.7 mg/dl (ref 0.20–1.60)
Total Protein: 7.6 g/dL (ref 6.4–8.1)

## 2012-03-26 LAB — CBC WITH DIFFERENTIAL (CANCER CENTER ONLY)
BASO#: 0 10*3/uL (ref 0.0–0.2)
BASO%: 0.4 % (ref 0.0–2.0)
EOS%: 2.1 % (ref 0.0–7.0)
Eosinophils Absolute: 0.1 10*3/uL (ref 0.0–0.5)
HCT: 40.4 % (ref 38.7–49.9)
HGB: 13.5 g/dL (ref 13.0–17.1)
LYMPH#: 1.4 10*3/uL (ref 0.9–3.3)
LYMPH%: 27.1 % (ref 14.0–48.0)
MCH: 31.1 pg (ref 28.0–33.4)
MCHC: 33.4 g/dL (ref 32.0–35.9)
MCV: 93 fL (ref 82–98)
MONO#: 0.4 10*3/uL (ref 0.1–0.9)
MONO%: 6.8 % (ref 0.0–13.0)
NEUT#: 3.4 10*3/uL (ref 1.5–6.5)
NEUT%: 63.6 % (ref 40.0–80.0)
Platelets: 232 10*3/uL (ref 145–400)
RBC: 4.34 10*6/uL (ref 4.20–5.70)
RDW: 12.6 % (ref 11.1–15.7)
WBC: 5.3 10*3/uL (ref 4.0–10.0)

## 2012-03-26 MED ORDER — DEGARELIX ACETATE 80 MG ~~LOC~~ SOLR: 80.0000 mg | Freq: Once | SUBCUTANEOUS | Status: DC

## 2012-03-26 MED ORDER — DENOSUMAB 120 MG/1.7ML ~~LOC~~ SOLN: 120.0000 mg | Freq: Once | SUBCUTANEOUS | Status: DC

## 2012-03-26 NOTE — Progress Notes (Signed)
   [facility]    OFFICE PROGRESS NOTE   INTERVAL HISTORY:   He returns as scheduled. He is maintained on monthly xgeva and firmagon. No significant hot flashes. Occasional night sweats. Good appetite and energy level. He is taking Casodex.  He reports occasional discomfort posterior to the right knee. No leg swelling or erythema. He has "stiffness "in the low back. [name] is not taking pain medication.  Objective:  Vital signs in last 24 hours:  Blood pressure 134/70, pulse 86, temperature 97.4 F (36.3 C), temperature source Oral, resp. rate 18, height 5\' 7"  (1.702 m), weight 168 lb (76.204 kg).  Resp: Lungs clear bilaterally Cardio: Regular rate and rhythm,? 1/6 systolic murmur GI: No hepatosplenomegaly Vascular: No leg edema or erythema Musculoskeletal: No tenderness at the low back or iliac  Lab Results:  Lab Results  Component Value Date   WBC 5.3 03/26/2012   HGB 13.5 03/26/2012   HCT 40.4 03/26/2012   MCV 93 03/26/2012   PLT 232 03/26/2012   ANC 3.4  PSA on 03/04/2012-0.06  Medications: I have reviewed the patient's current medications.  Assessment/Plan:  1. Metastatic prostate cancer-currently maintained on monthly degarelix and daily Casodex  2. Bone metastases-treated with monthly xgeva  Disposition:  He appears stable. The PSA was slightly higher in February. We will followup on the PSA and testosterone level from today and decide on the indication for a restaging bone scan and adjustment of the anti-androgen regimen. He will return for an office visit in one month.   Thornton Papas, MD  03/26/2012  11:00 AM

## 2012-03-26 NOTE — Progress Notes (Signed)
Patient rescheduled for next week, too early for treatment. [name], [name] [employer]

## 2012-03-27 LAB — PSA: PSA: 0.08 ng/mL (ref <=4.00)

## 2012-03-27 LAB — TESTOSTERONE: Testosterone: 15 ng/dL — ABNORMAL LOW (ref 300–890)

## 2012-03-28 ENCOUNTER — Ambulatory Visit: Payer: BC Managed Care – PPO

## 2012-03-28 ENCOUNTER — Ambulatory Visit: Payer: BC Managed Care – PPO | Admitting: Internal Medicine

## 2012-03-28 ENCOUNTER — Other Ambulatory Visit: Payer: BC Managed Care – PPO | Admitting: Lab

## 2012-04-02 ENCOUNTER — Ambulatory Visit (HOSPITAL_BASED_OUTPATIENT_CLINIC_OR_DEPARTMENT_OTHER): Payer: BC Managed Care – PPO

## 2012-04-02 VITALS — BP 133/80 | HR 74 | Temp 96.4°F | Resp 18

## 2012-04-02 DIAGNOSIS — Z5111 Encounter for antineoplastic chemotherapy: Secondary | ICD-10-CM

## 2012-04-02 DIAGNOSIS — C61 Malignant neoplasm of prostate: Secondary | ICD-10-CM

## 2012-04-02 DIAGNOSIS — C7951 Secondary malignant neoplasm of bone: Secondary | ICD-10-CM

## 2012-04-02 MED ORDER — EPINEPHRINE HCL 0.1 MG/ML IJ SOLN
0.2500 mg | Freq: Once | INTRAMUSCULAR | Status: DC | PRN
  Filled 2012-04-02: qty 10

## 2012-04-02 MED ORDER — ALBUTEROL SULFATE (2.5 MG/3ML) 0.083% IN NEBU
2.5000 mg | INHALATION_SOLUTION | Freq: Once | RESPIRATORY_TRACT | Status: DC | PRN
  Filled 2012-04-02: qty 3

## 2012-04-02 MED ORDER — SODIUM CHLORIDE 0.9 % IV SOLN: Freq: Once | INTRAVENOUS | Status: DC | PRN

## 2012-04-02 MED ORDER — METHYLPREDNISOLONE SODIUM SUCC 125 MG IJ SOLR: 125.0000 mg | Freq: Once | INTRAMUSCULAR | Status: DC | PRN

## 2012-04-02 MED ORDER — DIPHENHYDRAMINE HCL 50 MG/ML IJ SOLN: 25.0000 mg | Freq: Once | INTRAMUSCULAR | Status: DC | PRN

## 2012-04-02 MED ORDER — DEGARELIX ACETATE 80 MG ~~LOC~~ SOLR
80.0000 mg | Freq: Once | SUBCUTANEOUS | Status: AC
  Administered 2012-04-02: 80 mg via SUBCUTANEOUS
  Filled 2012-04-02: qty 4

## 2012-04-02 MED ORDER — DENOSUMAB 120 MG/1.7ML ~~LOC~~ SOLN
120.0000 mg | Freq: Once | SUBCUTANEOUS | Status: AC
  Administered 2012-04-02: 120 mg via SUBCUTANEOUS
  Filled 2012-04-02: qty 1.7

## 2012-04-02 MED ORDER — DIPHENHYDRAMINE HCL 50 MG/ML IJ SOLN: 50.0000 mg | Freq: Once | INTRAMUSCULAR | Status: DC | PRN

## 2012-04-02 NOTE — Patient Instructions (Signed)
Degarelix injection What is this medicine? DEGARELIX (deg a REL ix) is used to treat men with advanced prostate cancer. This medicine may be used for other purposes; ask your health care provider or pharmacist if you have questions. What should I tell my health care provider before I take this medicine? They need to know if you have any of these conditions: -heart disease -kidney disease -liver disease -low levels of potassium or magnesium in the blood -osteoporosis -an unusual or allergic reaction to degarelix, mannitol, other medicines, foods, dyes, or preservatives -pregnant or trying to get pregnant -breast-feeding How should I use this medicine? This medicine is for injection under the skin. It is usually given by a health care professional in a hospital or clinic setting. If you get this medicine at home, you will be taught how to prepare and give this medicine. Use exactly as directed. Take your medicine at regular intervals. Do not take it more often than directed. It is important that you put your used needles and syringes in a special sharps container. Do not put them in a trash can. If you do not have a sharps container, call your pharmacist or healthcare provider to get one. Talk to your pediatrician regarding the use of this medicine in children. Special care may be needed. Overdosage: If you think you've taken too much of this medicine contact a poison control center or emergency room at once. Overdosage: If you think you have taken too much of this medicine contact a poison control center or emergency room at once. NOTE: This medicine is only for you. Do not share this medicine with others. What if I miss a dose? Try not to miss a dose. If you do miss a dose, call your doctor or health care professional for advice. What may interact with this medicine? Do not take this medicine with any of the following  medications: -amiodarone -bretylium -disopyramide -dofetilide -droperidol -ibutilide -procainamide -quinidine -sotalol This list may not describe all possible interactions. Give your health care provider a list of all the medicines, herbs, non-prescription drugs, or dietary supplements you use. Also tell them if you smoke, drink alcohol, or use illegal drugs. Some items may interact with your medicine. What should I watch for while using this medicine? Visit your doctor or health care professional for regular checks on your progress and discuss any issues before you start taking this medicine. Your doctor or health care professional will need to monitor your hormone levels in your blood to check your response to treatment. Try to keep any appointments for testing. What side effects may I notice from receiving this medicine? Side effects that you should report to your doctor or health care professional as soon as possible: -allergic reactions like skin rash, itching or hives, swelling of the face, lips, or tongue -fever or chills -irregular heartbeat -nausea and vomiting along with severe abdominal pain -pain or difficulty passing urine -pelvic pain or bloating Side effects that usually do not require medical attention (Report these to your doctor or health care professional if they continue or are bothersome.): -change in sex drive or performance -constipation -headache -high blood pressure - hot flashes (flushing of skin, increased sweating) - itching, redness or mild pain at site where injected -joint pain -trouble sleeping -unusually weak or tired -weight gain This list may not describe all possible side effects. Call your doctor for medical advice about side effects. You may report side effects to FDA at 1-800-FDA-1088. Where should I keep my  medicine? Keep out of the reach of children. This drug is usually given in a hospital or clinic and will not be stored at home. In rare  cases, this medicine may be given at home. If you are using this medicine at home, you will be instructed on how to store this medicine. Throw away any unused medicine after the expiration date on the label. NOTE: This sheet is a summary. It may not cover all possible information. If you have questions about this medicine, talk to your doctor, pharmacist, or health care provider.  2013, Elsevier/Gold Standard. (05/27/2007 4:41:07 PM)   Denosumab injection What is this medicine? DENOSUMAB slows bone breakdown. It is used to treat osteoporosis in women after menopause. This medicine is also used to prevent bone fractures and other bone problems caused by cancer bone metastases. This medicine may be used for other purposes; ask your health care provider or pharmacist if you have questions. What should I tell my health care provider before I take this medicine? They need to know if you have any of these conditions: -dental disease -eczema -infection or history of infections -kidney disease or on dialysis -low blood calcium or vitamin D -malabsorption syndrome -scheduled to have surgery or tooth extraction -taking medicine that contains denosumab -thyroid or parathyroid disease -an unusual reaction to denosumab, other medicines, foods, dyes, or preservatives -pregnant or trying to get pregnant -breast-feeding How should I use this medicine? This medicine is for injection under the skin. It is given by a health care professional in a hospital or clinic setting. If you are getting Prolia, a special MedGuide will be given to you by the pharmacist with each prescription and refill. Be sure to read this information carefully each time. Talk to your pediatrician regarding the use of this medicine in children. Special care may be needed. Overdosage: If you think you've taken too much of this medicine contact a poison control center or emergency room at once. Overdosage: If you think you have taken  too much of this medicine contact a poison control center or emergency room at once. NOTE: This medicine is only for you. Do not share this medicine with others. What if I miss a dose? It is important not to miss your dose. Call your doctor or health care professional if you are unable to keep an appointment. What may interact with this medicine? Do not take this medicine with any of the following medications: -other medicines containing denosumab This medicine may also interact with the following medications: -medicines that suppress the immune system -medicines that treat cancer -steroid medicines like prednisone or cortisone This list may not describe all possible interactions. Give your health care provider a list of all the medicines, herbs, non-prescription drugs, or dietary supplements you use. Also tell them if you smoke, drink alcohol, or use illegal drugs. Some items may interact with your medicine. What should I watch for while using this medicine? Visit your doctor or health care professional for regular checks on your progress. Your doctor or health care professional may order blood tests and other tests to see how you are doing. Call your doctor or health care professional if you get a cold or other infection while receiving this medicine. Do not treat yourself. This medicine may decrease your body's ability to fight infection. You should make sure you get enough calcium and vitamin D while you are taking this medicine, unless your doctor tells you not to. Discuss the foods you eat and the vitamins  you take with your health care professional. See your dentist regularly. Brush and floss your teeth as directed. Before you have any dental work done, tell your dentist you are receiving this medicine. What side effects may I notice from receiving this medicine? Side effects that you should report to your doctor or health care professional as soon as possible: -allergic reactions like skin  rash, itching or hives, swelling of the face, lips, or tongue -breathing problems -chest pain -fast, irregular heartbeat -feeling faint or lightheaded, falls -fever, chills, or any other sign of infection -muscle spasms, tightening, or twitches -numbness or tingling -skin blisters or bumps, or is dry, peels, or red -slow healing or unexplained pain in the mouth or jaw -unusual bleeding or bruising Side effects that usually do not require medical attention (Report these to your doctor or health care professional if they continue or are bothersome.): -muscle pain -stomach upset, gas This list may not describe all possible side effects. Call your doctor for medical advice about side effects. You may report side effects to FDA at 1-800-FDA-1088. Where should I keep my medicine? This medicine is only given in a clinic, doctor's office, or other health care setting and will not be stored at home. NOTE: This sheet is a summary. It may not cover all possible information. If you have questions about this medicine, talk to your doctor, pharmacist, or health care provider.  2012, Elsevier/Gold Standard. (08/24/2009 2:32:46 PM)

## 2012-04-25 ENCOUNTER — Ambulatory Visit: Payer: BC Managed Care – PPO

## 2012-04-25 ENCOUNTER — Ambulatory Visit: Payer: BC Managed Care – PPO | Admitting: Hematology & Oncology

## 2012-04-25 ENCOUNTER — Other Ambulatory Visit: Payer: BC Managed Care – PPO | Admitting: Lab

## 2012-05-02 ENCOUNTER — Ambulatory Visit (HOSPITAL_BASED_OUTPATIENT_CLINIC_OR_DEPARTMENT_OTHER): Payer: BC Managed Care – PPO

## 2012-05-02 ENCOUNTER — Other Ambulatory Visit (HOSPITAL_BASED_OUTPATIENT_CLINIC_OR_DEPARTMENT_OTHER): Payer: BC Managed Care – PPO | Admitting: Lab

## 2012-05-02 ENCOUNTER — Ambulatory Visit (HOSPITAL_BASED_OUTPATIENT_CLINIC_OR_DEPARTMENT_OTHER): Payer: BC Managed Care – PPO | Admitting: Hematology & Oncology

## 2012-05-02 VITALS — BP 118/71 | HR 74 | Temp 98.0°F | Resp 18 | Ht 67.0 in | Wt 167.0 lb

## 2012-05-02 DIAGNOSIS — C61 Malignant neoplasm of prostate: Secondary | ICD-10-CM

## 2012-05-02 DIAGNOSIS — G4700 Insomnia, unspecified: Secondary | ICD-10-CM

## 2012-05-02 DIAGNOSIS — Z5111 Encounter for antineoplastic chemotherapy: Secondary | ICD-10-CM

## 2012-05-02 DIAGNOSIS — C7951 Secondary malignant neoplasm of bone: Secondary | ICD-10-CM

## 2012-05-02 DIAGNOSIS — C7952 Secondary malignant neoplasm of bone marrow: Secondary | ICD-10-CM

## 2012-05-02 DIAGNOSIS — E291 Testicular hypofunction: Secondary | ICD-10-CM

## 2012-05-02 DIAGNOSIS — G4701 Insomnia due to medical condition: Secondary | ICD-10-CM

## 2012-05-02 LAB — CBC WITH DIFFERENTIAL (CANCER CENTER ONLY)
BASO#: 0 10*3/uL (ref 0.0–0.2)
BASO%: 0.4 % (ref 0.0–2.0)
EOS%: 3.3 % (ref 0.0–7.0)
Eosinophils Absolute: 0.2 10*3/uL (ref 0.0–0.5)
HCT: 40.3 % (ref 38.7–49.9)
HGB: 13.5 g/dL (ref 13.0–17.1)
LYMPH#: 1.4 10*3/uL (ref 0.9–3.3)
LYMPH%: 31.2 % (ref 14.0–48.0)
MCH: 31.3 pg (ref 28.0–33.4)
MCHC: 33.5 g/dL (ref 32.0–35.9)
MCV: 93 fL (ref 82–98)
MONO#: 0.4 10*3/uL (ref 0.1–0.9)
MONO%: 8.4 % (ref 0.0–13.0)
NEUT#: 2.6 10*3/uL (ref 1.5–6.5)
NEUT%: 56.7 % (ref 40.0–80.0)
Platelets: 218 10*3/uL (ref 145–400)
RBC: 4.32 10*6/uL (ref 4.20–5.70)
RDW: 12.3 % (ref 11.1–15.7)
WBC: 4.6 10*3/uL (ref 4.0–10.0)

## 2012-05-02 LAB — COMPREHENSIVE METABOLIC PANEL
ALT: 12 U/L (ref 0–53)
AST: 14 U/L (ref 0–37)
Albumin: 4.4 g/dL (ref 3.5–5.2)
Alkaline Phosphatase: 67 U/L (ref 39–117)
BUN: 17 mg/dL (ref 6–23)
CO2: 27 mEq/L (ref 19–32)
Calcium: 10 mg/dL (ref 8.4–10.5)
Chloride: 103 mEq/L (ref 96–112)
Creatinine, Ser: 1.13 mg/dL (ref 0.50–1.35)
Glucose, Bld: 85 mg/dL (ref 70–99)
Potassium: 4.3 mEq/L (ref 3.5–5.3)
Sodium: 138 mEq/L (ref 135–145)
Total Bilirubin: 0.3 mg/dL (ref 0.3–1.2)
Total Protein: 7 g/dL (ref 6.0–8.3)

## 2012-05-02 LAB — TESTOSTERONE: Testosterone: 20 ng/dL — ABNORMAL LOW (ref 300–890)

## 2012-05-02 LAB — PSA: PSA: 0.15 ng/mL (ref <=4.00)

## 2012-05-02 MED ORDER — TRAZODONE HCL 50 MG PO TABS: 75.0000 mg | ORAL_TABLET | Freq: Every day | ORAL | Status: DC

## 2012-05-02 MED ORDER — DENOSUMAB 120 MG/1.7ML ~~LOC~~ SOLN
120.0000 mg | Freq: Once | SUBCUTANEOUS | Status: AC
  Administered 2012-05-02: 120 mg via SUBCUTANEOUS
  Filled 2012-05-02: qty 1.7

## 2012-05-02 MED ORDER — DEGARELIX ACETATE 80 MG ~~LOC~~ SOLR
80.0000 mg | Freq: Once | SUBCUTANEOUS | Status: AC
  Administered 2012-05-02: 80 mg via SUBCUTANEOUS
  Filled 2012-05-02: qty 4

## 2012-05-02 NOTE — Patient Instructions (Signed)
Degarelix injection What is this medicine? DEGARELIX (deg a REL ix) is used to treat men with advanced prostate cancer. This medicine may be used for other purposes; ask your health care provider or pharmacist if you have questions. What should I tell my health care provider before I take this medicine? They need to know if you have any of these conditions: -heart disease -kidney disease -liver disease -low levels of potassium or magnesium in the blood -osteoporosis -an unusual or allergic reaction to degarelix, mannitol, other medicines, foods, dyes, or preservatives -pregnant or trying to get pregnant -breast-feeding How should I use this medicine? This medicine is for injection under the skin. It is usually given by a health care professional in a hospital or clinic setting. If you get this medicine at home, you will be taught how to prepare and give this medicine. Use exactly as directed. Take your medicine at regular intervals. Do not take it more often than directed. It is important that you put your used needles and syringes in a special sharps container. Do not put them in a trash can. If you do not have a sharps container, call your pharmacist or healthcare provider to get one. Talk to your pediatrician regarding the use of this medicine in children. Special care may be needed. Overdosage: If you think you've taken too much of this medicine contact a poison control center or emergency room at once. Overdosage: If you think you have taken too much of this medicine contact a poison control center or emergency room at once. NOTE: This medicine is only for you. Do not share this medicine with others. What if I miss a dose? Try not to miss a dose. If you do miss a dose, call your doctor or health care professional for advice. What may interact with this medicine? Do not take this medicine with any of the following  medications: -amiodarone -bretylium -disopyramide -dofetilide -droperidol -ibutilide -procainamide -quinidine -sotalol This list may not describe all possible interactions. Give your health care provider a list of all the medicines, herbs, non-prescription drugs, or dietary supplements you use. Also tell them if you smoke, drink alcohol, or use illegal drugs. Some items may interact with your medicine. What should I watch for while using this medicine? Visit your doctor or health care professional for regular checks on your progress and discuss any issues before you start taking this medicine. Your doctor or health care professional will need to monitor your hormone levels in your blood to check your response to treatment. Try to keep any appointments for testing. What side effects may I notice from receiving this medicine? Side effects that you should report to your doctor or health care professional as soon as possible: -allergic reactions like skin rash, itching or hives, swelling of the face, lips, or tongue -fever or chills -irregular heartbeat -nausea and vomiting along with severe abdominal pain -pain or difficulty passing urine -pelvic pain or bloating Side effects that usually do not require medical attention (Report these to your doctor or health care professional if they continue or are bothersome.): -change in sex drive or performance -constipation -headache -high blood pressure - hot flashes (flushing of skin, increased sweating) - itching, redness or mild pain at site where injected -joint pain -trouble sleeping -unusually weak or tired -weight gain This list may not describe all possible side effects. Call your doctor for medical advice about side effects. You may report side effects to FDA at 1-800-FDA-1088. Where should I keep my  medicine? Keep out of the reach of children. This drug is usually given in a hospital or clinic and will not be stored at home. In rare  cases, this medicine may be given at home. If you are using this medicine at home, you will be instructed on how to store this medicine. Throw away any unused medicine after the expiration date on the label. NOTE: This sheet is a summary. It may not cover all possible information. If you have questions about this medicine, talk to your doctor, pharmacist, or health care provider.  2013, Elsevier/[employer] Standard. (05/27/2007 4:41:07 PM) Denosumab injection What is this medicine? DENOSUMAB slows bone breakdown. It is used to treat osteoporosis in women after menopause and in men. This medicine is also used to prevent bone fractures and other bone problems caused by cancer bone metastases. This medicine may be used for other purposes; ask your health care provider or pharmacist if you have questions. What should I tell my health care provider before I take this medicine? They need to know if you have any of these conditions: -dental disease -eczema -infection or history of infections -kidney disease or on dialysis -low blood calcium or vitamin D -malabsorption syndrome -scheduled to have surgery or tooth extraction -taking medicine that contains denosumab -thyroid or parathyroid disease -an unusual reaction to denosumab, other medicines, foods, dyes, or preservatives -pregnant or trying to get pregnant -breast-feeding How should I use this medicine? This medicine is for injection under the skin. It is given by a health care professional in a hospital or clinic setting. If you are getting Prolia, a special MedGuide will be given to you by the pharmacist with each prescription and refill. Be sure to read this information carefully each time. Talk to your pediatrician regarding the use of this medicine in children. Special care may be needed. Overdosage: If you think you've taken too much of this medicine contact a poison control center or emergency room at once. Overdosage: If you think you have  taken too much of this medicine contact a poison control center or emergency room at once. NOTE: This medicine is only for you. Do not share this medicine with others. What if I miss a dose? It is important not to miss your dose. Call your doctor or health care professional if you are unable to keep an appointment. What may interact with this medicine? Do not take this medicine with any of the following medications: -other medicines containing denosumab This medicine may also interact with the following medications: -medicines that suppress the immune system -medicines that treat cancer -steroid medicines like prednisone or cortisone This list may not describe all possible interactions. Give your health care provider a list of all the medicines, herbs, non-prescription drugs, or dietary supplements you use. Also tell them if you smoke, drink alcohol, or use illegal drugs. Some items may interact with your medicine. What should I watch for while using this medicine? Visit your doctor or health care professional for regular checks on your progress. Your doctor or health care professional may order blood tests and other tests to see how you are doing. Call your doctor or health care professional if you get a cold or other infection while receiving this medicine. Do not treat yourself. This medicine may decrease your body's ability to fight infection. You should make sure you get enough calcium and vitamin D while you are taking this medicine, unless your doctor tells you not to. Discuss the foods you eat and the  vitamins you take with your health care professional. See your dentist regularly. Brush and floss your teeth as directed. Before you have any dental work done, tell your dentist you are receiving this medicine. What side effects may I notice from receiving this medicine? Side effects that you should report to your doctor or health care professional as soon as possible: -allergic reactions  like skin rash, itching or hives, swelling of the face, lips, or tongue -breathing problems -chest pain -fast, irregular heartbeat -feeling faint or lightheaded, falls -fever, chills, or any other sign of infection -muscle spasms, tightening, or twitches -numbness or tingling -skin blisters or bumps, or is dry, peels, or red -slow healing or unexplained pain in the mouth or jaw -unusual bleeding or bruising Side effects that usually do not require medical attention (Report these to your doctor or health care professional if they continue or are bothersome.): -muscle pain -stomach upset, gas This list may not describe all possible side effects. Call your doctor for medical advice about side effects. You may report side effects to FDA at 1-800-FDA-1088. Where should I keep my medicine? This medicine is only given in a clinic, doctor's office, or other health care setting and will not be stored at home. NOTE: This sheet is a summary. It may not cover all possible information. If you have questions about this medicine, talk to your doctor, pharmacist, or health care provider.  2013, Elsevier/Gold Standard. (10/10/2010 3:40:41 PM)

## 2012-05-02 NOTE — Progress Notes (Signed)
This office note has been dictated.

## 2012-05-05 ENCOUNTER — Other Ambulatory Visit: Payer: Self-pay | Admitting: Hematology & Oncology

## 2012-05-05 ENCOUNTER — Encounter: Payer: Self-pay | Admitting: Hematology & Oncology

## 2012-05-05 DIAGNOSIS — C61 Malignant neoplasm of prostate: Secondary | ICD-10-CM

## 2012-05-05 NOTE — Progress Notes (Signed)
CC:   [name], MD [facility] Fax [phone] [place]uber[place] Basque. Jerl Santos, M.D.  DIAGNOSIS: 1. Metastatic prostate cancer, hormone-responsive.  CURRENT THERAPY: 1. Firmagon 80 mg subcu q. month. 2. Casodex 50 mg p.o. daily. 3. XGEVA 120 mg subcu q. month.  INTERIM HISTORY:  Mr. Fauth comes in for his followup.  He is doing okay.  He is still having some sleep issues.  We will increase is trazodone up to 75 mg.  If says he does have some "weakness" in his back.  This has been more of a chronic issue for him.  His right shoulder is doing better.  He is not as limited.  He got a cortisone injection which helped.  As far as his prostate cancer is concerned, his last PSA was 0.8.  This is holding pretty stable.  He is castrate level testosterone.  Last testosterone was 15.  There is no cough.  He has had no diarrhea.  He has had no leg swelling. He has had no rashes.  He has had no headache.  He is still trying to exercise and walk.  PHYSICAL EXAMINATION:  General:  This is a well-developed, well- nourished white gentleman in no obvious distress.  Vital signs: Temperature of 98, pulse 74, respiratory rate 18, blood pressure 118/71. Weight is 167.  Head and neck:  Normocephalic, atraumatic skull.  There are no ocular or oral lesions.  There are no palpable cervical or supraclavicular lymph nodes.  Lungs:  Clear bilaterally.  Cardiac: Regular rate and rhythm with normal S1, S2.  There are no murmurs, rubs, or bruits.  Abdomen:  Soft with good bowel sounds.  There is no palpable abdominal mass.  There is no fluid wave.  There is no palpable hepatosplenomegaly.  Back:  No tenderness over the spine, ribs, or hips. Extremities:  Show no clubbing, cyanosis, or edema.  Skin:  No rashes, ecchymoses,or petechia.  LABORATORY STUDIES:  White count is 4.6, hemoglobin 13.5, hematocrit 40.3, platelet count 218.  IMPRESSION:  Mr. Spurgeon is a 67 year old gentleman with  metastatic prostate cancer.  Again, I would consider him hormone responsive still.  We will have to monitor the prostate-specific antigen closely.  If we do see a significant jump in the PSA, then we will have to get scans set up on him.  We will plan to get him back in another month or so for followup.  He has done well with his androgen deprivation therapy.    ______________________________ Josph Macho, M.D. PRE/MEDQ  D:  05/02/2012  T:  05/03/2012  Job:  [id number]

## 2012-05-06 ENCOUNTER — Telehealth: Payer: Self-pay | Admitting: Hematology & Oncology

## 2012-05-06 NOTE — Telephone Encounter (Signed)
Pt aware of 4-23 bone scan and CT at [facility]. He is aware to pick up contrast and to drink at 12 and 1 pm. He knows to be NPO 4 hrs prior to CT

## 2012-05-07 ENCOUNTER — Encounter (HOSPITAL_COMMUNITY)
Admission: RE | Admit: 2012-05-07 | Discharge: 2012-05-07 | Disposition: A | Discharge status: Home / Self Care | Payer: BC Managed Care – PPO | Source: Ambulatory Visit | Attending: Hematology & Oncology | Admitting: Hematology & Oncology

## 2012-05-07 ENCOUNTER — Encounter (HOSPITAL_COMMUNITY): Payer: Self-pay

## 2012-05-07 ENCOUNTER — Ambulatory Visit (HOSPITAL_COMMUNITY)
Admission: RE | Admit: 2012-05-07 | Discharge: 2012-05-07 | Disposition: A | Discharge status: Home / Self Care | Payer: BC Managed Care – PPO | Source: Ambulatory Visit | Attending: Hematology & Oncology | Admitting: Hematology & Oncology

## 2012-05-07 DIAGNOSIS — C7952 Secondary malignant neoplasm of bone marrow: Secondary | ICD-10-CM | POA: Insufficient documentation

## 2012-05-07 DIAGNOSIS — N200 Calculus of kidney: Secondary | ICD-10-CM | POA: Insufficient documentation

## 2012-05-07 DIAGNOSIS — E041 Nontoxic single thyroid nodule: Secondary | ICD-10-CM | POA: Insufficient documentation

## 2012-05-07 DIAGNOSIS — C61 Malignant neoplasm of prostate: Secondary | ICD-10-CM | POA: Insufficient documentation

## 2012-05-07 DIAGNOSIS — C7951 Secondary malignant neoplasm of bone: Secondary | ICD-10-CM | POA: Insufficient documentation

## 2012-05-07 DIAGNOSIS — R972 Elevated prostate specific antigen [PSA]: Secondary | ICD-10-CM | POA: Insufficient documentation

## 2012-05-07 DIAGNOSIS — R911 Solitary pulmonary nodule: Secondary | ICD-10-CM | POA: Insufficient documentation

## 2012-05-07 MED ORDER — TECHNETIUM TC 99M MEDRONATE IV KIT
24.0000 | PACK | Freq: Once | INTRAVENOUS | Status: AC | PRN
  Administered 2012-05-07: 24 via INTRAVENOUS

## 2012-05-07 MED ORDER — IOHEXOL 300 MG/ML  SOLN
100.0000 mL | Freq: Once | INTRAMUSCULAR | Status: AC | PRN
  Administered 2012-05-07: 100 mL via INTRAVENOUS

## 2012-05-07 MED ORDER — IOHEXOL 300 MG/ML  SOLN
50.0000 mL | Freq: Once | INTRAMUSCULAR | Status: AC | PRN
  Administered 2012-05-07: 50 mL via ORAL

## 2012-05-30 ENCOUNTER — Other Ambulatory Visit: Payer: BC Managed Care – PPO | Admitting: Lab

## 2012-05-30 ENCOUNTER — Ambulatory Visit: Payer: BC Managed Care – PPO | Admitting: Hematology & Oncology

## 2012-06-06 ENCOUNTER — Ambulatory Visit (HOSPITAL_BASED_OUTPATIENT_CLINIC_OR_DEPARTMENT_OTHER): Payer: BC Managed Care – PPO | Admitting: Hematology & Oncology

## 2012-06-06 ENCOUNTER — Other Ambulatory Visit: Payer: Self-pay | Admitting: Hematology & Oncology

## 2012-06-06 ENCOUNTER — Other Ambulatory Visit (HOSPITAL_BASED_OUTPATIENT_CLINIC_OR_DEPARTMENT_OTHER): Payer: BC Managed Care – PPO | Admitting: Lab

## 2012-06-06 ENCOUNTER — Ambulatory Visit (HOSPITAL_BASED_OUTPATIENT_CLINIC_OR_DEPARTMENT_OTHER): Payer: BC Managed Care – PPO

## 2012-06-06 VITALS — BP 125/74 | HR 85 | Temp 97.8°F | Resp 18 | Ht 67.0 in | Wt 161.0 lb

## 2012-06-06 DIAGNOSIS — C7951 Secondary malignant neoplasm of bone: Secondary | ICD-10-CM

## 2012-06-06 DIAGNOSIS — C7952 Secondary malignant neoplasm of bone marrow: Secondary | ICD-10-CM

## 2012-06-06 DIAGNOSIS — C61 Malignant neoplasm of prostate: Secondary | ICD-10-CM

## 2012-06-06 DIAGNOSIS — Z5111 Encounter for antineoplastic chemotherapy: Secondary | ICD-10-CM

## 2012-06-06 DIAGNOSIS — E291 Testicular hypofunction: Secondary | ICD-10-CM

## 2012-06-06 LAB — CBC WITH DIFFERENTIAL (CANCER CENTER ONLY)
BASO#: 0 10*3/uL (ref 0.0–0.2)
BASO%: 0.4 % (ref 0.0–2.0)
EOS%: 3.6 % (ref 0.0–7.0)
Eosinophils Absolute: 0.2 10*3/uL (ref 0.0–0.5)
HCT: 42.1 % (ref 38.7–49.9)
HGB: 14 g/dL (ref 13.0–17.1)
LYMPH#: 1.3 10*3/uL (ref 0.9–3.3)
LYMPH%: 28.1 % (ref 14.0–48.0)
MCH: 31.3 pg (ref 28.0–33.4)
MCHC: 33.3 g/dL (ref 32.0–35.9)
MCV: 94 fL (ref 82–98)
MONO#: 0.4 10*3/uL (ref 0.1–0.9)
MONO%: 7.7 % (ref 0.0–13.0)
NEUT#: 2.8 10*3/uL (ref 1.5–6.5)
NEUT%: 60.2 % (ref 40.0–80.0)
Platelets: 200 10*3/uL (ref 145–400)
RBC: 4.47 10*6/uL (ref 4.20–5.70)
RDW: 12.7 % (ref 11.1–15.7)
WBC: 4.7 10*3/uL (ref 4.0–10.0)

## 2012-06-06 LAB — CMP (CANCER CENTER ONLY)
ALT(SGPT): 22 U/L (ref 10–47)
AST: 21 U/L (ref 11–38)
Albumin: 3.7 g/dL (ref 3.3–5.5)
Alkaline Phosphatase: 67 U/L (ref 26–84)
BUN, Bld: 12 mg/dL (ref 7–22)
CO2: 30 mEq/L (ref 18–33)
Calcium: 9.3 mg/dL (ref 8.0–10.3)
Chloride: 101 mEq/L (ref 98–108)
Creat: 0.9 mg/dl (ref 0.6–1.2)
Glucose, Bld: 137 mg/dL — ABNORMAL HIGH (ref 73–118)
Potassium: 4 mEq/L (ref 3.3–4.7)
Sodium: 140 mEq/L (ref 128–145)
Total Bilirubin: 0.8 mg/dl (ref 0.20–1.60)
Total Protein: 7.4 g/dL (ref 6.4–8.1)

## 2012-06-06 LAB — PSA: PSA: 0.27 ng/mL (ref <=4.00)

## 2012-06-06 LAB — TESTOSTERONE: Testosterone: 16 ng/dL — ABNORMAL LOW (ref 300–890)

## 2012-06-06 MED ORDER — DENOSUMAB 120 MG/1.7ML ~~LOC~~ SOLN
120.0000 mg | Freq: Once | SUBCUTANEOUS | Status: AC
  Administered 2012-06-06: 120 mg via SUBCUTANEOUS
  Filled 2012-06-06: qty 1.7

## 2012-06-06 MED ORDER — DEGARELIX ACETATE 80 MG ~~LOC~~ SOLR
80.0000 mg | Freq: Once | SUBCUTANEOUS | Status: AC
  Administered 2012-06-06: 80 mg via SUBCUTANEOUS
  Filled 2012-06-06: qty 4

## 2012-06-06 NOTE — Progress Notes (Signed)
This office note has been dictated.

## 2012-06-06 NOTE — Patient Instructions (Signed)
Denosumab injection What is this medicine? DENOSUMAB slows bone breakdown. It is used to treat osteoporosis in women after menopause and in men. This medicine is also used to prevent bone fractures and other bone problems caused by cancer bone metastases. This medicine may be used for other purposes; ask your health care provider or pharmacist if you have questions. What should I tell my health care provider before I take this medicine? They need to know if you have any of these conditions: -dental disease -eczema -infection or history of infections -kidney disease or on dialysis -low blood calcium or vitamin D -malabsorption syndrome -scheduled to have surgery or tooth extraction -taking medicine that contains denosumab -thyroid or parathyroid disease -an unusual reaction to denosumab, other medicines, foods, dyes, or preservatives -pregnant or trying to get pregnant -breast-feeding How should I use this medicine? This medicine is for injection under the skin. It is given by a health care professional in a hospital or clinic setting. If you are getting Prolia, a special MedGuide will be given to you by the pharmacist with each prescription and refill. Be sure to read this information carefully each time. Talk to your pediatrician regarding the use of this medicine in children. Special care may be needed. Overdosage: If you think you've taken too much of this medicine contact a poison control center or emergency room at once. Overdosage: If you think you have taken too much of this medicine contact a poison control center or emergency room at once. NOTE: This medicine is only for you. Do not share this medicine with others. What if I miss a dose? It is important not to miss your dose. Call your doctor or health care professional if you are unable to keep an appointment. What may interact with this medicine? Do not take this medicine with any of the following medications: -other medicines  containing denosumab This medicine may also interact with the following medications: -medicines that suppress the immune system -medicines that treat cancer -steroid medicines like prednisone or cortisone This list may not describe all possible interactions. Give your health care provider a list of all the medicines, herbs, non-prescription drugs, or dietary supplements you use. Also tell them if you smoke, drink alcohol, or use illegal drugs. Some items may interact with your medicine. What should I watch for while using this medicine? Visit your doctor or health care professional for regular checks on your progress. Your doctor or health care professional may order blood tests and other tests to see how you are doing. Call your doctor or health care professional if you get a cold or other infection while receiving this medicine. Do not treat yourself. This medicine may decrease your body's ability to fight infection. You should make sure you get enough calcium and vitamin D while you are taking this medicine, unless your doctor tells you not to. Discuss the foods you eat and the vitamins you take with your health care professional. See your dentist regularly. Brush and floss your teeth as directed. Before you have any dental work done, tell your dentist you are receiving this medicine. What side effects may I notice from receiving this medicine? Side effects that you should report to your doctor or health care professional as soon as possible: -allergic reactions like skin rash, itching or hives, swelling of the face, lips, or tongue -breathing problems -chest pain -fast, irregular heartbeat -feeling faint or lightheaded, falls -fever, chills, or any other sign of infection -muscle spasms, tightening, or twitches -numbness   or tingling -skin blisters or bumps, or is dry, peels, or red -slow healing or unexplained pain in the mouth or jaw -unusual bleeding or bruising Side effects that  usually do not require medical attention (Report these to your doctor or health care professional if they continue or are bothersome.): -muscle pain -stomach upset, gas This list may not describe all possible side effects. Call your doctor for medical advice about side effects. You may report side effects to FDA at 1-800-FDA-1088. Where should I keep my medicine? This medicine is only given in a clinic, doctor's office, or other health care setting and will not be stored at home. NOTE: This sheet is a summary. It may not cover all possible information. If you have questions about this medicine, talk to your doctor, pharmacist, or health care provider.  2013, Elsevier/Gold Standard. (10/10/2010 3:40:41 PM)  

## 2012-06-10 NOTE — Progress Notes (Signed)
CC:   [name], MD [employer] Basque. Jerl Santos, M.D. [facility], [place] [phone]  DIAGNOSIS:  Metastatic prostate cancer.  CURRENT THERAPY: 1. Casodex withdrawal. 2. Firmagon 80 mg subcu q.month. 3. Xgeva 120 mg subcu q.month.  INTERIM HISTORY:  Mr. [name] comes in for his followup.  His PSA had been going up.  As such, I felt that the best way for [place] to try to treat this would be to withdraw the Casodex.  His PSA basically went from 0.08 up to 0.15.  Again, the trend is that PSA has been going up slowly.  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  I did repeat his scans.  His scans really did not show any evidence of progressive disease.  He is feeling well.  He is having no problems pain wise.  His legs hurt sometimes when he sits down for a while.  He has had no change in bowel or bladder habits.  There has been no fever.  He has had no rashes.  He has had no swallowing difficulties.  PHYSICAL EXAMINATION:  General:  This is a well-developed, well- nourished white gentleman in no obvious distress.  Vital signs:  Show temperature of 97.8, pulse 85, respiratory rate 18, blood pressure 125/74.  Weight is 161.  Head and neck:  Shows a normocephalic, atraumatic skull.  There are no ocular or oral lesions.  There are no palpable cervical or supraclavicular lymph nodes.  Lungs:  Clear bilaterally.  Cardiac:  Regular rate and rhythm with normal S1, S2. There are no murmurs, rubs or bruits.  Abdomen:  Soft with good bowel sounds.  There is no palpable abdominal mass.  There is no fluid wave. There is no palpable hepatosplenomegaly.  Extremities:  Show no clubbing, cyanosis or edema.  Skin:  No rash, ecchymosis or petechia.  LABORATORY STUDIES:  White cell count is 4.7, hemoglobin 14, hematocrit 42.1, platelet count 200,000.  His sodium is 140, potassium 4, BUN 12, creatinine 0.9.  Calcium is 9.3 with an albumin of  3.7.  IMPRESSION:  Mr. [name] is a 67 year old gentleman with metastatic prostate cancer.  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  I want to try him on antiandrogen withdrawal.  We will see how this does.  He has been on Casodex probably for 7 or 8 months.  I think if we find his PSA continues to rise, then the next step probably would be ketoconazole.  We will go ahead and give him the [place] today.  We will get him back in 1 more month for followup.    ______________________________ Josph Macho, M.D. PRE/MEDQ  D:  06/06/2012  T:  06/07/2012  Job:  [id number]

## 2012-07-04 ENCOUNTER — Other Ambulatory Visit: Payer: BC Managed Care – PPO | Admitting: Lab

## 2012-07-04 ENCOUNTER — Ambulatory Visit: Payer: BC Managed Care – PPO | Admitting: Hematology & Oncology

## 2012-07-04 ENCOUNTER — Ambulatory Visit: Payer: BC Managed Care – PPO

## 2012-07-11 ENCOUNTER — Ambulatory Visit (HOSPITAL_BASED_OUTPATIENT_CLINIC_OR_DEPARTMENT_OTHER): Payer: BC Managed Care – PPO | Admitting: Hematology & Oncology

## 2012-07-11 ENCOUNTER — Ambulatory Visit (HOSPITAL_BASED_OUTPATIENT_CLINIC_OR_DEPARTMENT_OTHER): Payer: BC Managed Care – PPO

## 2012-07-11 ENCOUNTER — Other Ambulatory Visit (HOSPITAL_BASED_OUTPATIENT_CLINIC_OR_DEPARTMENT_OTHER): Payer: BC Managed Care – PPO

## 2012-07-11 ENCOUNTER — Other Ambulatory Visit: Payer: BC Managed Care – PPO | Admitting: Lab

## 2012-07-11 VITALS — BP 112/67 | HR 79 | Temp 98.6°F | Resp 16 | Wt 165.0 lb

## 2012-07-11 DIAGNOSIS — Z5111 Encounter for antineoplastic chemotherapy: Secondary | ICD-10-CM

## 2012-07-11 DIAGNOSIS — C7951 Secondary malignant neoplasm of bone: Secondary | ICD-10-CM

## 2012-07-11 DIAGNOSIS — C61 Malignant neoplasm of prostate: Secondary | ICD-10-CM

## 2012-07-11 DIAGNOSIS — C7952 Secondary malignant neoplasm of bone marrow: Secondary | ICD-10-CM

## 2012-07-11 DIAGNOSIS — L03114 Cellulitis of left upper limb: Secondary | ICD-10-CM

## 2012-07-11 DIAGNOSIS — IMO0002 Reserved for concepts with insufficient information to code with codable children: Secondary | ICD-10-CM

## 2012-07-11 LAB — CBC WITH DIFFERENTIAL (CANCER CENTER ONLY)
BASO#: 0 10*3/uL (ref 0.0–0.2)
BASO%: 0.4 % (ref 0.0–2.0)
EOS%: 2.3 % (ref 0.0–7.0)
Eosinophils Absolute: 0.1 10*3/uL (ref 0.0–0.5)
HCT: 40.1 % (ref 38.7–49.9)
HGB: 13.3 g/dL (ref 13.0–17.1)
LYMPH#: 1.4 10*3/uL (ref 0.9–3.3)
LYMPH%: 25.3 % (ref 14.0–48.0)
MCH: 31 pg (ref 28.0–33.4)
MCHC: 33.2 g/dL (ref 32.0–35.9)
MCV: 94 fL (ref 82–98)
MONO#: 0.5 10*3/uL (ref 0.1–0.9)
MONO%: 9.4 % (ref 0.0–13.0)
NEUT#: 3.6 10*3/uL (ref 1.5–6.5)
NEUT%: 62.6 % (ref 40.0–80.0)
Platelets: 227 10*3/uL (ref 145–400)
RBC: 4.29 10*6/uL (ref 4.20–5.70)
RDW: 12.9 % (ref 11.1–15.7)
WBC: 5.7 10*3/uL (ref 4.0–10.0)

## 2012-07-11 LAB — CMP (CANCER CENTER ONLY)
ALT(SGPT): 17 U/L (ref 10–47)
AST: 20 U/L (ref 11–38)
Albumin: 3.6 g/dL (ref 3.3–5.5)
Alkaline Phosphatase: 78 U/L (ref 26–84)
BUN, Bld: 14 mg/dL (ref 7–22)
CO2: 29 mEq/L (ref 18–33)
Calcium: 9.7 mg/dL (ref 8.0–10.3)
Chloride: 103 mEq/L (ref 98–108)
Creat: 1 mg/dl (ref 0.6–1.2)
Glucose, Bld: 119 mg/dL — ABNORMAL HIGH (ref 73–118)
Potassium: 4.6 mEq/L (ref 3.3–4.7)
Sodium: 141 mEq/L (ref 128–145)
Total Bilirubin: 0.5 mg/dl (ref 0.20–1.60)
Total Protein: 7.3 g/dL (ref 6.4–8.1)

## 2012-07-11 LAB — PSA: PSA: 0.91 ng/mL (ref <=4.00)

## 2012-07-11 MED ORDER — DENOSUMAB 120 MG/1.7ML ~~LOC~~ SOLN
120.0000 mg | Freq: Once | SUBCUTANEOUS | Status: AC
  Administered 2012-07-11: 120 mg via SUBCUTANEOUS
  Filled 2012-07-11: qty 1.7

## 2012-07-11 MED ORDER — DOXYCYCLINE HYCLATE 100 MG PO TABS: 100.0000 mg | ORAL_TABLET | Freq: Two times a day (BID) | ORAL | Status: DC

## 2012-07-11 MED ORDER — DEGARELIX ACETATE 80 MG ~~LOC~~ SOLR
80.0000 mg | Freq: Once | SUBCUTANEOUS | Status: AC
  Administered 2012-07-11: 80 mg via SUBCUTANEOUS
  Filled 2012-07-11: qty 4

## 2012-07-11 NOTE — Progress Notes (Signed)
This office note has been dictated.

## 2012-07-11 NOTE — Patient Instructions (Signed)
Degarelix injection What is this medicine? DEGARELIX (deg a REL ix) is used to treat men with advanced prostate cancer. This medicine may be used for other purposes; ask your health care provider or pharmacist if you have questions. What should I tell my health care provider before I take this medicine? They need to know if you have any of these conditions: -heart disease -kidney disease -liver disease -low levels of potassium or magnesium in the blood -osteoporosis -an unusual or allergic reaction to degarelix, mannitol, other medicines, foods, dyes, or preservatives -pregnant or trying to get pregnant -breast-feeding How should I use this medicine? This medicine is for injection under the skin. It is usually given by a health care professional in a hospital or clinic setting. If you get this medicine at home, you will be taught how to prepare and give this medicine. Use exactly as directed. Take your medicine at regular intervals. Do not take it more often than directed. It is important that you put your used needles and syringes in a special sharps container. Do not put them in a trash can. If you do not have a sharps container, call your pharmacist or healthcare provider to get one. Talk to your pediatrician regarding the use of this medicine in children. Special care may be needed. Overdosage: If you think you've taken too much of this medicine contact a poison control center or emergency room at once. Overdosage: If you think you have taken too much of this medicine contact a poison control center or emergency room at once. NOTE: This medicine is only for you. Do not share this medicine with others. What if I miss a dose? Try not to miss a dose. If you do miss a dose, call your doctor or health care professional for advice. What may interact with this medicine? Do not take this medicine with any of the following  medications: -amiodarone -bretylium -disopyramide -dofetilide -droperidol -ibutilide -procainamide -quinidine -sotalol This list may not describe all possible interactions. Give your health care provider a list of all the medicines, herbs, non-prescription drugs, or dietary supplements you use. Also tell them if you smoke, drink alcohol, or use illegal drugs. Some items may interact with your medicine. What should I watch for while using this medicine? Visit your doctor or health care professional for regular checks on your progress and discuss any issues before you start taking this medicine. Your doctor or health care professional will need to monitor your hormone levels in your blood to check your response to treatment. Try to keep any appointments for testing. What side effects may I notice from receiving this medicine? Side effects that you should report to your doctor or health care professional as soon as possible: -allergic reactions like skin rash, itching or hives, swelling of the face, lips, or tongue -fever or chills -irregular heartbeat -nausea and vomiting along with severe abdominal pain -pain or difficulty passing urine -pelvic pain or bloating Side effects that usually do not require medical attention (Report these to your doctor or health care professional if they continue or are bothersome.): -change in sex drive or performance -constipation -headache -high blood pressure - hot flashes (flushing of skin, increased sweating) - itching, redness or mild pain at site where injected -joint pain -trouble sleeping -unusually weak or tired -weight gain This list may not describe all possible side effects. Call your doctor for medical advice about side effects. You may report side effects to FDA at 1-800-FDA-1088. Where should I keep my  medicine? Keep out of the reach of children. This drug is usually given in a hospital or clinic and will not be stored at home. In rare  cases, this medicine may be given at home. If you are using this medicine at home, you will be instructed on how to store this medicine. Throw away any unused medicine after the expiration date on the label. NOTE: This sheet is a summary. It may not cover all possible information. If you have questions about this medicine, talk to your doctor, pharmacist, or health care provider.  2013, Elsevier/[employer] Standard. (05/27/2007 4:41:07 PM) Denosumab injection What is this medicine? DENOSUMAB slows bone breakdown. It is used to treat osteoporosis in women after menopause and in men. This medicine is also used to prevent bone fractures and other bone problems caused by cancer bone metastases. This medicine may be used for other purposes; ask your health care provider or pharmacist if you have questions. What should I tell my health care provider before I take this medicine? They need to know if you have any of these conditions: -dental disease -eczema -infection or history of infections -kidney disease or on dialysis -low blood calcium or vitamin D -malabsorption syndrome -scheduled to have surgery or tooth extraction -taking medicine that contains denosumab -thyroid or parathyroid disease -an unusual reaction to denosumab, other medicines, foods, dyes, or preservatives -pregnant or trying to get pregnant -breast-feeding How should I use this medicine? This medicine is for injection under the skin. It is given by a health care professional in a hospital or clinic setting. If you are getting Prolia, a special MedGuide will be given to you by the pharmacist with each prescription and refill. Be sure to read this information carefully each time. Talk to your pediatrician regarding the use of this medicine in children. Special care may be needed. Overdosage: If you think you've taken too much of this medicine contact a poison control center or emergency room at once. Overdosage: If you think you have  taken too much of this medicine contact a poison control center or emergency room at once. NOTE: This medicine is only for you. Do not share this medicine with others. What if I miss a dose? It is important not to miss your dose. Call your doctor or health care professional if you are unable to keep an appointment. What may interact with this medicine? Do not take this medicine with any of the following medications: -other medicines containing denosumab This medicine may also interact with the following medications: -medicines that suppress the immune system -medicines that treat cancer -steroid medicines like prednisone or cortisone This list may not describe all possible interactions. Give your health care provider a list of all the medicines, herbs, non-prescription drugs, or dietary supplements you use. Also tell them if you smoke, drink alcohol, or use illegal drugs. Some items may interact with your medicine. What should I watch for while using this medicine? Visit your doctor or health care professional for regular checks on your progress. Your doctor or health care professional may order blood tests and other tests to see how you are doing. Call your doctor or health care professional if you get a cold or other infection while receiving this medicine. Do not treat yourself. This medicine may decrease your body's ability to fight infection. You should make sure you get enough calcium and vitamin D while you are taking this medicine, unless your doctor tells you not to. Discuss the foods you eat and the  vitamins you take with your health care professional. See your dentist regularly. Brush and floss your teeth as directed. Before you have any dental work done, tell your dentist you are receiving this medicine. What side effects may I notice from receiving this medicine? Side effects that you should report to your doctor or health care professional as soon as possible: -allergic reactions  like skin rash, itching or hives, swelling of the face, lips, or tongue -breathing problems -chest pain -fast, irregular heartbeat -feeling faint or lightheaded, falls -fever, chills, or any other sign of infection -muscle spasms, tightening, or twitches -numbness or tingling -skin blisters or bumps, or is dry, peels, or red -slow healing or unexplained pain in the mouth or jaw -unusual bleeding or bruising Side effects that usually do not require medical attention (Report these to your doctor or health care professional if they continue or are bothersome.): -muscle pain -stomach upset, gas This list may not describe all possible side effects. Call your doctor for medical advice about side effects. You may report side effects to FDA at 1-800-FDA-1088. Where should I keep my medicine? This medicine is only given in a clinic, doctor's office, or other health care setting and will not be stored at home. NOTE: This sheet is a summary. It may not cover all possible information. If you have questions about this medicine, talk to your doctor, pharmacist, or health care provider.  2013, Elsevier/Gold Standard. (10/10/2010 3:40:41 PM)

## 2012-07-14 NOTE — Progress Notes (Signed)
CC:   Michael Dubin, MD [facility], [place] [phone] [name]. [name], M.D.  DIAGNOSIS:  Metastatic prostate cancer-castrate responsive.  CURRENT THERAPY: 1. Casodex withdrawal. 2. __________ 80 mg subcu every month. 3. Xgeva 120 mg subcu every month.  INTERIM HISTORY:  [name] comes in for his followup.  He is doing fairly well.  He has had no specific problems outside the fact that he was bitten in the left upper arm by "something."  This happened on Wednesday.  He has some cellulitis around this area.  He has had no fever.  He has had no nausea or vomiting.  He has had no cough or shortness of breath.  There has been no change in bowel or bladder habits.  His last PSA was up a little bit more.  It was 0.27.  Again, for [name], that is "moving up." His castrate level testosterone at 16.  PHYSICAL EXAM:  General:  This is a well-developed, well-nourished white gentleman in no obvious distress.  Vital signs:  Temperature of 98.6, pulse 79, respiratory rate 16, blood pressure 112/67.  Weight is 165. Head and neck:  Normocephalic, atraumatic skull.  There are no ocular or oral lesions.  There are no palpable cervical or supraclavicular lymph nodes.  Lungs:  Clear bilaterally.  Cardiac:  Regular rate and rhythm with a normal S1, S2.  There are no murmurs, rubs or bruits.  Abdomen: Soft with good bowel sounds.  There is no palpable abdominal mass. There is no palpable hepatosplenomegaly.  Extremities:  Show no clubbing, cyanosis or edema.  Neurological:  Shows no focal neurological deficits.  Skin:  Does show an area of cellulitis on the triceps area of the left upper arm.  This is somewhat warm.  It is erythematous.  It is nontender.  LABORATORY STUDIES:  White cell count is 5.7, hemoglobin 13.3, hematocrit 40.1, platelet count is 227.  IMPRESSION:  [name] is a very nice, 67 year old gentleman with metastatic prostate cancer.  He has oligometastatic disease.   He does not have parenchymal mets.  We will continue to follow the PSA.  If he does progress, then I will try him on ketoconazole.  I think this will be next line therapy.  I will put him on some doxycycline for this cellulitis.  I am not sure what could have bitten him.  It certainly looks like an insect bite.  We will plan to get him back in another month or so for followup.    ______________________________ [name], M.D. PRE/MEDQ  D:  07/11/2012  T:  07/13/2012  Job:  [id number]

## 2012-07-15 ENCOUNTER — Telehealth: Payer: Self-pay | Admitting: Hematology & Oncology

## 2012-07-15 NOTE — Telephone Encounter (Signed)
FYI:  [employer] [place] - Last Auth for [id number] exp on 01/15/2012 due to pt's plan changed on 01/16/2012. The [place]um was valid from 07/30/2011-07/29/2012 and that Auth Nbr: [id number].  New Auth Nbr for 07/11/2012 is [id number] APPROVED 07/11/2012-07/11/2013.   [id number]

## 2012-07-22 ENCOUNTER — Other Ambulatory Visit: Payer: Self-pay | Admitting: Hematology & Oncology

## 2012-07-22 DIAGNOSIS — C61 Malignant neoplasm of prostate: Secondary | ICD-10-CM

## 2012-07-22 MED ORDER — PREDNISONE 5 MG PO TABS: 5.0000 mg | ORAL_TABLET | Freq: Every day | ORAL | Status: DC

## 2012-07-22 MED ORDER — KETOCONAZOLE 200 MG PO TABS: ORAL_TABLET | ORAL | Status: DC

## 2012-07-24 ENCOUNTER — Telehealth: Payer: Self-pay | Admitting: Oncology

## 2012-07-24 NOTE — Telephone Encounter (Addendum)
Message copied by Lacie Draft on Thu Jul 24, 2012  9:45 AM ------      Message from: Josph Macho      Created: Tue Jul 22, 2012  2:10 PM       i called Mr. Bunn and told him that his PSA was up to 0.9. For him, this is a jump.            I think we need to try him on a ketoconazole.. I will try 600 mg a day. I will also use prednisone at 5 mg a day.            We will send this to his pharmacy in [place].            Pete ------ See note from Dr. Myna Hidalgo, he has already talked to t patient.

## 2012-08-01 ENCOUNTER — Ambulatory Visit: Payer: BC Managed Care – PPO

## 2012-08-01 ENCOUNTER — Ambulatory Visit: Payer: BC Managed Care – PPO | Admitting: Hematology & Oncology

## 2012-08-01 ENCOUNTER — Other Ambulatory Visit: Payer: BC Managed Care – PPO | Admitting: Lab

## 2012-08-08 ENCOUNTER — Ambulatory Visit: Payer: BC Managed Care – PPO

## 2012-08-08 ENCOUNTER — Ambulatory Visit: Payer: BC Managed Care – PPO | Admitting: Hematology & Oncology

## 2012-08-08 ENCOUNTER — Other Ambulatory Visit: Payer: BC Managed Care – PPO | Admitting: Lab

## 2012-08-15 ENCOUNTER — Encounter: Payer: Self-pay | Admitting: Hematology & Oncology

## 2012-08-15 ENCOUNTER — Other Ambulatory Visit (HOSPITAL_BASED_OUTPATIENT_CLINIC_OR_DEPARTMENT_OTHER): Payer: BC Managed Care – PPO | Admitting: Lab

## 2012-08-15 ENCOUNTER — Ambulatory Visit (HOSPITAL_BASED_OUTPATIENT_CLINIC_OR_DEPARTMENT_OTHER): Payer: BC Managed Care – PPO

## 2012-08-15 ENCOUNTER — Ambulatory Visit (HOSPITAL_BASED_OUTPATIENT_CLINIC_OR_DEPARTMENT_OTHER): Payer: BC Managed Care – PPO | Admitting: Hematology & Oncology

## 2012-08-15 VITALS — BP 113/69 | HR 75 | Temp 98.1°F | Resp 16 | Ht 67.0 in | Wt 166.0 lb

## 2012-08-15 DIAGNOSIS — Z5111 Encounter for antineoplastic chemotherapy: Secondary | ICD-10-CM

## 2012-08-15 DIAGNOSIS — C61 Malignant neoplasm of prostate: Secondary | ICD-10-CM

## 2012-08-15 DIAGNOSIS — C7951 Secondary malignant neoplasm of bone: Secondary | ICD-10-CM

## 2012-08-15 DIAGNOSIS — C7952 Secondary malignant neoplasm of bone marrow: Secondary | ICD-10-CM

## 2012-08-15 DIAGNOSIS — L03114 Cellulitis of left upper limb: Secondary | ICD-10-CM

## 2012-08-15 LAB — CBC WITH DIFFERENTIAL (CANCER CENTER ONLY)
BASO#: 0 10*3/uL (ref 0.0–0.2)
BASO%: 0.3 % (ref 0.0–2.0)
EOS%: 1.4 % (ref 0.0–7.0)
Eosinophils Absolute: 0.1 10*3/uL (ref 0.0–0.5)
HCT: 42.6 % (ref 38.7–49.9)
HGB: 14 g/dL (ref 13.0–17.1)
LYMPH#: 1.7 10*3/uL (ref 0.9–3.3)
LYMPH%: 24.2 % (ref 14.0–48.0)
MCH: 31.3 pg (ref 28.0–33.4)
MCHC: 32.9 g/dL (ref 32.0–35.9)
MCV: 95 fL (ref 82–98)
MONO#: 0.4 10*3/uL (ref 0.1–0.9)
MONO%: 5.4 % (ref 0.0–13.0)
NEUT#: 4.8 10*3/uL (ref 1.5–6.5)
NEUT%: 68.7 % (ref 40.0–80.0)
Platelets: 201 10*3/uL (ref 145–400)
RBC: 4.48 10*6/uL (ref 4.20–5.70)
RDW: 13.7 % (ref 11.1–15.7)
WBC: 7 10*3/uL (ref 4.0–10.0)

## 2012-08-15 LAB — CMP (CANCER CENTER ONLY)
ALT(SGPT): 21 U/L (ref 10–47)
AST: 19 U/L (ref 11–38)
Albumin: 3.8 g/dL (ref 3.3–5.5)
Alkaline Phosphatase: 67 U/L (ref 26–84)
BUN, Bld: 13 mg/dL (ref 7–22)
CO2: 34 mEq/L — ABNORMAL HIGH (ref 18–33)
Calcium: 9.4 mg/dL (ref 8.0–10.3)
Chloride: 99 mEq/L (ref 98–108)
Creat: 1.1 mg/dl (ref 0.6–1.2)
Glucose, Bld: 110 mg/dL (ref 73–118)
Potassium: 4 mEq/L (ref 3.3–4.7)
Sodium: 139 mEq/L (ref 128–145)
Total Bilirubin: 0.7 mg/dl (ref 0.20–1.60)
Total Protein: 7.5 g/dL (ref 6.4–8.1)

## 2012-08-15 LAB — PSA: PSA: 0.14 ng/mL (ref <=4.00)

## 2012-08-15 MED ORDER — DENOSUMAB 120 MG/1.7ML ~~LOC~~ SOLN
120.0000 mg | Freq: Once | SUBCUTANEOUS | Status: AC
  Administered 2012-08-15: 120 mg via SUBCUTANEOUS
  Filled 2012-08-15: qty 1.7

## 2012-08-15 MED ORDER — DEGARELIX ACETATE 80 MG ~~LOC~~ SOLR
80.0000 mg | Freq: Once | SUBCUTANEOUS | Status: AC
  Administered 2012-08-15: 80 mg via SUBCUTANEOUS
  Filled 2012-08-15: qty 4

## 2012-08-15 NOTE — Patient Instructions (Signed)
Denosumab injection What is this medicine? DENOSUMAB slows bone breakdown. It is used to treat osteoporosis in women after menopause and in men. This medicine is also used to prevent bone fractures and other bone problems caused by cancer bone metastases. This medicine may be used for other purposes; ask your health care provider or pharmacist if you have questions. What should I tell my health care provider before I take this medicine? They need to know if you have any of these conditions: -dental disease -eczema -infection or history of infections -kidney disease or on dialysis -low blood calcium or vitamin D -malabsorption syndrome -scheduled to have surgery or tooth extraction -taking medicine that contains denosumab -thyroid or parathyroid disease -an unusual reaction to denosumab, other medicines, foods, dyes, or preservatives -pregnant or trying to get pregnant -breast-feeding How should I use this medicine? This medicine is for injection under the skin. It is given by a health care professional in a hospital or clinic setting. If you are getting Prolia, a special MedGuide will be given to you by the pharmacist with each prescription and refill. Be sure to read this information carefully each time. Talk to your pediatrician regarding the use of this medicine in children. Special care may be needed. Overdosage: If you think you've taken too much of this medicine contact a poison control center or emergency room at once. Overdosage: If you think you have taken too much of this medicine contact a poison control center or emergency room at once. NOTE: This medicine is only for you. Do not share this medicine with others. What if I miss a dose? It is important not to miss your dose. Call your doctor or health care professional if you are unable to keep an appointment. What may interact with this medicine? Do not take this medicine with any of the following medications: -other medicines  containing denosumab This medicine may also interact with the following medications: -medicines that suppress the immune system -medicines that treat cancer -steroid medicines like prednisone or cortisone This list may not describe all possible interactions. Give your health care provider a list of all the medicines, herbs, non-prescription drugs, or dietary supplements you use. Also tell them if you smoke, drink alcohol, or use illegal drugs. Some items may interact with your medicine. What should I watch for while using this medicine? Visit your doctor or health care professional for regular checks on your progress. Your doctor or health care professional may order blood tests and other tests to see how you are doing. Call your doctor or health care professional if you get a cold or other infection while receiving this medicine. Do not treat yourself. This medicine may decrease your body's ability to fight infection. You should make sure you get enough calcium and vitamin D while you are taking this medicine, unless your doctor tells you not to. Discuss the foods you eat and the vitamins you take with your health care professional. See your dentist regularly. Brush and floss your teeth as directed. Before you have any dental work done, tell your dentist you are receiving this medicine. What side effects may I notice from receiving this medicine? Side effects that you should report to your doctor or health care professional as soon as possible: -allergic reactions like skin rash, itching or hives, swelling of the face, lips, or tongue -breathing problems -chest pain -fast, irregular heartbeat -feeling faint or lightheaded, falls -fever, chills, or any other sign of infection -muscle spasms, tightening, or twitches -numbness  or tingling -skin blisters or bumps, or is dry, peels, or red -slow healing or unexplained pain in the mouth or jaw -unusual bleeding or bruising Side effects that  usually do not require medical attention (Report these to your doctor or health care professional if they continue or are bothersome.): -muscle pain -stomach upset, gas This list may not describe all possible side effects. Call your doctor for medical advice about side effects. You may report side effects to FDA at 1-800-FDA-1088. Where should I keep my medicine? This medicine is only given in a clinic, doctor's office, or other health care setting and will not be stored at home. NOTE: This sheet is a summary. It may not cover all possible information. If you have questions about this medicine, talk to your doctor, pharmacist, or health care provider.  2013, Elsevier/Gold Standard. (10/10/2010 3:40:41 PM)   Degarelix injection What is this medicine? DEGARELIX (deg a REL ix) is used to treat men with advanced prostate cancer. This medicine may be used for other purposes; ask your health care provider or pharmacist if you have questions. What should I tell my health care provider before I take this medicine? They need to know if you have any of these conditions: -heart disease -kidney disease -liver disease -low levels of potassium or magnesium in the blood -osteoporosis -an unusual or allergic reaction to degarelix, mannitol, other medicines, foods, dyes, or preservatives -pregnant or trying to get pregnant -breast-feeding How should I use this medicine? This medicine is for injection under the skin. It is usually given by a health care professional in a hospital or clinic setting. If you get this medicine at home, you will be taught how to prepare and give this medicine. Use exactly as directed. Take your medicine at regular intervals. Do not take it more often than directed. It is important that you put your used needles and syringes in a special sharps container. Do not put them in a trash can. If you do not have a sharps container, call your pharmacist or healthcare provider to get  one. Talk to your pediatrician regarding the use of this medicine in children. Special care may be needed. Overdosage: If you think you've taken too much of this medicine contact a poison control center or emergency room at once. Overdosage: If you think you have taken too much of this medicine contact a poison control center or emergency room at once. NOTE: This medicine is only for you. Do not share this medicine with others. What if I miss a dose? Try not to miss a dose. If you do miss a dose, call your doctor or health care professional for advice. What may interact with this medicine? Do not take this medicine with any of the following medications: -amiodarone -bretylium -disopyramide -dofetilide -droperidol -ibutilide -procainamide -quinidine -sotalol This list may not describe all possible interactions. Give your health care provider a list of all the medicines, herbs, non-prescription drugs, or dietary supplements you use. Also tell them if you smoke, drink alcohol, or use illegal drugs. Some items may interact with your medicine. What should I watch for while using this medicine? Visit your doctor or health care professional for regular checks on your progress and discuss any issues before you start taking this medicine. Your doctor or health care professional will need to monitor your hormone levels in your blood to check your response to treatment. Try to keep any appointments for testing. What side effects may I notice from receiving this medicine?  Side effects that you should report to your doctor or health care professional as soon as possible: -allergic reactions like skin rash, itching or hives, swelling of the face, lips, or tongue -fever or chills -irregular heartbeat -nausea and vomiting along with severe abdominal pain -pain or difficulty passing urine -pelvic pain or bloating Side effects that usually do not require medical attention (Report these to your doctor or  health care professional if they continue or are bothersome.): -change in sex drive or performance -constipation -headache -high blood pressure - hot flashes (flushing of skin, increased sweating) - itching, redness or mild pain at site where injected -joint pain -trouble sleeping -unusually weak or tired -weight gain This list may not describe all possible side effects. Call your doctor for medical advice about side effects. You may report side effects to FDA at 1-800-FDA-1088. Where should I keep my medicine? Keep out of the reach of children. This drug is usually given in a hospital or clinic and will not be stored at home. In rare cases, this medicine may be given at home. If you are using this medicine at home, you will be instructed on how to store this medicine. Throw away any unused medicine after the expiration date on the label. NOTE: This sheet is a summary. It may not cover all possible information. If you have questions about this medicine, talk to your doctor, pharmacist, or health care provider.  2013, Elsevier/Gold Standard. (05/27/2007 4:41:07 PM)

## 2012-08-15 NOTE — Progress Notes (Signed)
This office note has been dictated.

## 2012-08-18 ENCOUNTER — Telehealth: Payer: Self-pay | Admitting: Oncology

## 2012-08-18 NOTE — Progress Notes (Signed)
CC:   Michael Dubin, MD [facility], [place] [phone] Lubertha Basque. Jerl Santos, M.D.  DIAGNOSIS:  Metastatic castrate resistant prostate cancer.  CURRENT THERAPY: 1. Ketoconazole 200 mg p.o. t.i.d. 2. Xgeva 120 mg subcutaneous monthly. 3. Degarelix 80 mg subcutaneous monthly.  INTERIM HISTORY:  [name] comes in for his followup.  We have him on ketoconazole now.  His PSA continued to drift upward.  He was feeling okay.  However, his PSA was continuing to trend upward.  His PSA was 0.9.  Previously, it was 0.27.  Again, the trend has been to gradually increase.  Throughout, he has been on ketoconazole now for about 2 weeks or so.  He has tolerated this well.  He has had no diarrhea.  He has had no fevers, sweats or chills.  He has had some occasional bony pains, but these are not chronic.  He has not noted any leg swelling.  He has had no rashes.  He has had no increase in fatigue or weakness.  He is sleeping okay.  Overall, his performance status is ECOG 1.  PHYSICAL EXAMINATION:  General:  This is a well-developed, well- nourished white gentleman in no obvious distress.  Vital signs: Temperature of 98.1, pulse 75, respiratory rate 16, blood pressure 113/69, weight is 166.  Head/Neck:  Normocephalic, atraumatic skull. There are no ocular or oral lesions.  There are no palpable cervical or supraclavicular lymph nodes.  Lungs:  Clear bilaterally.  Cardiac: Regular rate and rhythm with a normal S1, S2.  There are no murmurs, rubs or bruits.  Abdomen:  Soft.  He has good bowel sounds.  There is no fluid wave.  There is no palpable hepatosplenomegaly.  Extremities. Extremities show no clubbing, cyanosis or edema.  Neurologic:  Shows no focal neurological deficits.  Skin:  Shows no rashes, ecchymoses or petechia.  LABORATORY STUDIES:  White cell count is 7, hemoglobin 14, hematocrit 43, platelet count 201.  His BUN 13, creatinine 1.1.  Liver function tests are normal.   Calcium is 9.4 with an albumin of 3.8.  IMPRESSION:  [name] is a 67 year old gentleman with metastatic prostate cancer.  He is castrate resistant.  __________ castrate levels of testosterone.  I think it is still too soon to see how the ketoconazole is going.  I think we just need to be patient with this.  We will go ahead and give him the Degarelix and Xgeva today.  We will get him back in 4 weeks.  At that point in time, we should have a better idea as to whether not the ketoconazole is helping.    ______________________________ [name], M.D. PRE/MEDQ  D:  08/15/2012  T:  08/15/2012  Job:  [id number]

## 2012-08-18 NOTE — Telephone Encounter (Addendum)
Message copied by Lacie Draft on Mon Aug 18, 2012 12:46 PM ------      Message from: Josph Macho      Created: Sun Aug 17, 2012  7:35 PM       Call - PSA is down to 014!!!!  Randie Heinz!!!  Cindee Lame ------Spoke with patient.

## 2012-09-12 ENCOUNTER — Ambulatory Visit (HOSPITAL_BASED_OUTPATIENT_CLINIC_OR_DEPARTMENT_OTHER): Payer: BC Managed Care – PPO

## 2012-09-12 ENCOUNTER — Ambulatory Visit (HOSPITAL_BASED_OUTPATIENT_CLINIC_OR_DEPARTMENT_OTHER): Payer: BC Managed Care – PPO | Admitting: Lab

## 2012-09-12 ENCOUNTER — Ambulatory Visit (HOSPITAL_BASED_OUTPATIENT_CLINIC_OR_DEPARTMENT_OTHER): Payer: BC Managed Care – PPO | Admitting: Hematology & Oncology

## 2012-09-12 VITALS — BP 124/65 | HR 78 | Temp 98.3°F | Resp 18 | Ht 67.0 in | Wt 167.0 lb

## 2012-09-12 DIAGNOSIS — C7951 Secondary malignant neoplasm of bone: Secondary | ICD-10-CM

## 2012-09-12 DIAGNOSIS — C61 Malignant neoplasm of prostate: Secondary | ICD-10-CM

## 2012-09-12 LAB — CBC WITH DIFFERENTIAL (CANCER CENTER ONLY)
BASO#: 0 10*3/uL (ref 0.0–0.2)
BASO%: 0.2 % (ref 0.0–2.0)
EOS%: 1.3 % (ref 0.0–7.0)
Eosinophils Absolute: 0.1 10*3/uL (ref 0.0–0.5)
HCT: 42.1 % (ref 38.7–49.9)
HGB: 13.9 g/dL (ref 13.0–17.1)
LYMPH#: 1.5 10*3/uL (ref 0.9–3.3)
LYMPH%: 23.5 % (ref 14.0–48.0)
MCH: 31.7 pg (ref 28.0–33.4)
MCHC: 33 g/dL (ref 32.0–35.9)
MCV: 96 fL (ref 82–98)
MONO#: 0.5 10*3/uL (ref 0.1–0.9)
MONO%: 8.7 % (ref 0.0–13.0)
NEUT#: 4.1 10*3/uL (ref 1.5–6.5)
NEUT%: 66.3 % (ref 40.0–80.0)
Platelets: 194 10*3/uL (ref 145–400)
RBC: 4.38 10*6/uL (ref 4.20–5.70)
RDW: 13.4 % (ref 11.1–15.7)
WBC: 6.2 10*3/uL (ref 4.0–10.0)

## 2012-09-12 LAB — CMP (CANCER CENTER ONLY)
ALT(SGPT): 15 U/L (ref 10–47)
AST: 17 U/L (ref 11–38)
Albumin: 3.6 g/dL (ref 3.3–5.5)
Alkaline Phosphatase: 60 U/L (ref 26–84)
BUN, Bld: 16 mg/dL (ref 7–22)
CO2: 33 mEq/L (ref 18–33)
Calcium: 9.5 mg/dL (ref 8.0–10.3)
Chloride: 100 mEq/L (ref 98–108)
Creat: 0.9 mg/dl (ref 0.6–1.2)
Glucose, Bld: 102 mg/dL (ref 73–118)
Potassium: 4.1 mEq/L (ref 3.3–4.7)
Sodium: 138 mEq/L (ref 128–145)
Total Bilirubin: 0.7 mg/dl (ref 0.20–1.60)
Total Protein: 7.1 g/dL (ref 6.4–8.1)

## 2012-09-12 LAB — PSA: PSA: 0.36 ng/mL (ref <=4.00)

## 2012-09-12 MED ORDER — DEGARELIX ACETATE 80 MG ~~LOC~~ SOLR
80.0000 mg | Freq: Once | SUBCUTANEOUS | Status: AC
  Administered 2012-09-12: 80 mg via SUBCUTANEOUS
  Filled 2012-09-12: qty 4

## 2012-09-12 MED ORDER — DENOSUMAB 120 MG/1.7ML ~~LOC~~ SOLN
120.0000 mg | Freq: Once | SUBCUTANEOUS | Status: AC
  Administered 2012-09-12: 120 mg via SUBCUTANEOUS
  Filled 2012-09-12: qty 1.7

## 2012-09-12 NOTE — Patient Instructions (Signed)
Denosumab injection What is this medicine? DENOSUMAB slows bone breakdown. It is used to treat osteoporosis in women after menopause and in men. This medicine is also used to prevent bone fractures and other bone problems caused by cancer bone metastases. This medicine may be used for other purposes; ask your health care provider or pharmacist if you have questions. What should I tell my health care provider before I take this medicine? They need to know if you have any of these conditions: -dental disease -eczema -infection or history of infections -kidney disease or on dialysis -low blood calcium or vitamin D -malabsorption syndrome -scheduled to have surgery or tooth extraction -taking medicine that contains denosumab -thyroid or parathyroid disease -an unusual reaction to denosumab, other medicines, foods, dyes, or preservatives -pregnant or trying to get pregnant -breast-feeding How should I use this medicine? This medicine is for injection under the skin. It is given by a health care professional in a hospital or clinic setting. If you are getting Prolia, a special MedGuide will be given to you by the pharmacist with each prescription and refill. Be sure to read this information carefully each time. Talk to your pediatrician regarding the use of this medicine in children. Special care may be needed. Overdosage: If you think you've taken too much of this medicine contact a poison control center or emergency room at once. Overdosage: If you think you have taken too much of this medicine contact a poison control center or emergency room at once. NOTE: This medicine is only for you. Do not share this medicine with others. What if I miss a dose? It is important not to miss your dose. Call your doctor or health care professional if you are unable to keep an appointment. What may interact with this medicine? Do not take this medicine with any of the following medications: -other medicines  containing denosumab This medicine may also interact with the following medications: -medicines that suppress the immune system -medicines that treat cancer -steroid medicines like prednisone or cortisone This list may not describe all possible interactions. Give your health care provider a list of all the medicines, herbs, non-prescription drugs, or dietary supplements you use. Also tell them if you smoke, drink alcohol, or use illegal drugs. Some items may interact with your medicine. What should I watch for while using this medicine? Visit your doctor or health care professional for regular checks on your progress. Your doctor or health care professional may order blood tests and other tests to see how you are doing. Call your doctor or health care professional if you get a cold or other infection while receiving this medicine. Do not treat yourself. This medicine may decrease your body's ability to fight infection. You should make sure you get enough calcium and vitamin D while you are taking this medicine, unless your doctor tells you not to. Discuss the foods you eat and the vitamins you take with your health care professional. See your dentist regularly. Brush and floss your teeth as directed. Before you have any dental work done, tell your dentist you are receiving this medicine. What side effects may I notice from receiving this medicine? Side effects that you should report to your doctor or health care professional as soon as possible: -allergic reactions like skin rash, itching or hives, swelling of the face, lips, or tongue -breathing problems -chest pain -fast, irregular heartbeat -feeling faint or lightheaded, falls -fever, chills, or any other sign of infection -muscle spasms, tightening, or twitches -numbness   or tingling -skin blisters or bumps, or is dry, peels, or red -slow healing or unexplained pain in the mouth or jaw -unusual bleeding or bruising Side effects that  usually do not require medical attention (Report these to your doctor or health care professional if they continue or are bothersome.): -muscle pain -stomach upset, gas This list may not describe all possible side effects. Call your doctor for medical advice about side effects. You may report side effects to FDA at 1-800-FDA-1088. Where should I keep my medicine? This medicine is only given in a clinic, doctor's office, or other health care setting and will not be stored at home. NOTE: This sheet is a summary. It may not cover all possible information. If you have questions about this medicine, talk to your doctor, pharmacist, or health care provider.  2013, Elsevier/Gold Standard. (10/10/2010 3:40:41 PM)  

## 2012-09-12 NOTE — Progress Notes (Signed)
This office note has been dictated.

## 2012-09-16 NOTE — Progress Notes (Signed)
CC:   Foye Deer, MD  DIAGNOSIS:  Metastatic castrate resistant prostate cancer.  CURRENT THERAPY: 1. Ketoconazole 200 mg p.o. t.i.d. 2. Xgeva 120 mg subcutaneous monthly. 3. Degarelix 80 mg subcutaneous monthly.  INTERIM HISTORY:  Mr. [name] comes in for followup.  He is doing quite well.  He has had no problems with the ketoconazole.  There has been no diarrhea.  His PSA has come down quite nicely.  His last PSA was down to 0.14.  He has had no problems with cough or shortness breath.  According to his wife, he sometimes "over does it."  He has had no fever.  He has had no rashes.  He has had no bleeding or bruising.  PHYSICAL EXAMINATION:  General:  This is a well-developed, well- nourished white gentleman in no obvious distress.  Vital signs: Temperature of 98.3, pulse 78, respiratory rate 18, blood pressure 124/65.  Weight is 167.  Head and neck:  Normocephalic, atraumatic skull.  There are no ocular or oral lesions.  There are no palpable cervical or supraclavicular lymph nodes.  Lungs:  Clear bilaterally. Cardiac:  Regular rate and rhythm with a normal S1, S2.  There are no murmurs, rubs or bruits.  Abdomen:  Soft.  He has good bowel sounds. There is no fluid wave.  There is no palpable hepatosplenomegaly. Extremities:  Show no clubbing, cyanosis or edema.  Neurological:  No focal neurological deficits.  LABORATORY STUDIES:  White cell count is 6.2, hemoglobin 14, hematocrit 42, platelet count 194.  Calcium 9.5 with an albumin of 3.6.  IMPRESSION:  Mr. [name] is a 67 year old gentleman with metastatic prostate cancer.  He is castrate resistant.  He is on ketoconazole and so far responding.  He is totally asymptomatic, which is nice to see.  We will go ahead and continue him on ketoconazole.  He will get the [facility] and Degarelix today.  I will plan to get him back to see me in another month.    ______________________________ [name],  M.D. PRE/MEDQ  D:  09/12/2012  T:  09/13/2012  Job:  [id number]

## 2012-10-10 ENCOUNTER — Ambulatory Visit: Payer: BC Managed Care – PPO | Admitting: Hematology & Oncology

## 2012-10-10 ENCOUNTER — Ambulatory Visit: Payer: BC Managed Care – PPO

## 2012-10-10 ENCOUNTER — Other Ambulatory Visit: Payer: BC Managed Care – PPO | Admitting: Lab

## 2012-10-17 ENCOUNTER — Ambulatory Visit (HOSPITAL_BASED_OUTPATIENT_CLINIC_OR_DEPARTMENT_OTHER): Payer: BC Managed Care – PPO | Admitting: Hematology & Oncology

## 2012-10-17 ENCOUNTER — Other Ambulatory Visit (HOSPITAL_BASED_OUTPATIENT_CLINIC_OR_DEPARTMENT_OTHER): Payer: BC Managed Care – PPO | Admitting: Lab

## 2012-10-17 ENCOUNTER — Other Ambulatory Visit: Payer: Self-pay | Admitting: Hematology & Oncology

## 2012-10-17 ENCOUNTER — Ambulatory Visit (HOSPITAL_BASED_OUTPATIENT_CLINIC_OR_DEPARTMENT_OTHER): Payer: BC Managed Care – PPO

## 2012-10-17 VITALS — BP 126/67 | HR 82 | Temp 98.0°F | Resp 18 | Ht 67.0 in | Wt 172.0 lb

## 2012-10-17 DIAGNOSIS — C61 Malignant neoplasm of prostate: Secondary | ICD-10-CM

## 2012-10-17 DIAGNOSIS — C7951 Secondary malignant neoplasm of bone: Secondary | ICD-10-CM

## 2012-10-17 DIAGNOSIS — Z5111 Encounter for antineoplastic chemotherapy: Secondary | ICD-10-CM

## 2012-10-17 LAB — PSA: PSA: 0.27 ng/mL (ref <=4.00)

## 2012-10-17 LAB — CMP (CANCER CENTER ONLY)
ALT(SGPT): 19 U/L (ref 10–47)
AST: 17 U/L (ref 11–38)
Albumin: 3.5 g/dL (ref 3.3–5.5)
Alkaline Phosphatase: 62 U/L (ref 26–84)
BUN, Bld: 15 mg/dL (ref 7–22)
CO2: 31 mEq/L (ref 18–33)
Calcium: 9.1 mg/dL (ref 8.0–10.3)
Chloride: 100 mEq/L (ref 98–108)
Creat: 1.3 mg/dl — ABNORMAL HIGH (ref 0.6–1.2)
Glucose, Bld: 147 mg/dL — ABNORMAL HIGH (ref 73–118)
Potassium: 3.7 mEq/L (ref 3.3–4.7)
Sodium: 139 mEq/L (ref 128–145)
Total Bilirubin: 0.6 mg/dl (ref 0.20–1.60)
Total Protein: 7.2 g/dL (ref 6.4–8.1)

## 2012-10-17 LAB — CBC WITH DIFFERENTIAL (CANCER CENTER ONLY)
BASO#: 0 10*3/uL (ref 0.0–0.2)
BASO%: 0.3 % (ref 0.0–2.0)
EOS%: 1.5 % (ref 0.0–7.0)
Eosinophils Absolute: 0.1 10*3/uL (ref 0.0–0.5)
HCT: 41.4 % (ref 38.7–49.9)
HGB: 13.7 g/dL (ref 13.0–17.1)
LYMPH#: 1.9 10*3/uL (ref 0.9–3.3)
LYMPH%: 28.3 % (ref 14.0–48.0)
MCH: 31.9 pg (ref 28.0–33.4)
MCHC: 33.1 g/dL (ref 32.0–35.9)
MCV: 96 fL (ref 82–98)
MONO#: 0.4 10*3/uL (ref 0.1–0.9)
MONO%: 5.8 % (ref 0.0–13.0)
NEUT#: 4.3 10*3/uL (ref 1.5–6.5)
NEUT%: 64.1 % (ref 40.0–80.0)
Platelets: 187 10*3/uL (ref 145–400)
RBC: 4.3 10*6/uL (ref 4.20–5.70)
RDW: 13.5 % (ref 11.1–15.7)
WBC: 6.7 10*3/uL (ref 4.0–10.0)

## 2012-10-17 MED ORDER — DENOSUMAB 120 MG/1.7ML ~~LOC~~ SOLN
120.0000 mg | Freq: Once | SUBCUTANEOUS | Status: AC
  Administered 2012-10-17: 120 mg via SUBCUTANEOUS
  Filled 2012-10-17: qty 1.7

## 2012-10-17 MED ORDER — DENOSUMAB 120 MG/1.7ML ~~LOC~~ SOLN: 120.0000 mg | Freq: Once | SUBCUTANEOUS | Status: DC

## 2012-10-17 MED ORDER — DEGARELIX ACETATE 80 MG ~~LOC~~ SOLR
80.0000 mg | Freq: Once | SUBCUTANEOUS | Status: AC
  Administered 2012-10-17: 80 mg via SUBCUTANEOUS
  Filled 2012-10-17: qty 4

## 2012-10-17 NOTE — Progress Notes (Signed)
This office note has been dictated.

## 2012-10-17 NOTE — Progress Notes (Signed)
CC:   Foye Deer, MD  DIAGNOSIS:  Metastatic prostate cancer -- castrate resilient.  CURRENT THERAPY: 1. Ketoconazole 200 mg p.o. t.i.d. 2. Xgeva 120 mg subcu q. month. 3. Degarelix 80 mg subcu q. month.  INTERIM HISTORY:  Mr. Cianci comes in for his followup.  He is doing fairly well.  He has tolerated the ketoconazole without any problems. He has not had diarrhea.  Liver function tests have been okay.  His PSA, when we checked back in August, was 0.36.  That was up for him. His PSA has always been on the low side.  He has not had any problems with cough or shortness of breath.  He has had a little bit of discomfort over in the right hip area.  He says this is not "pain," but more of "twinge."  There has been no leg swelling.  He has had no change in bowel or bladder habits.  There has been no bleeding.  PHYSICAL EXAMINATION:  General:  This is a well-developed, well- nourished white gentleman in no obvious distress.  Vital signs: Temperature of 98, pulse 82, respiratory rate 18, blood pressure 126/67. Weight is 172 pounds.  Head and neck:  Normocephalic, atraumatic skull. There are no ocular or oral lesions.  There are no palpable cervical or supraclavicular lymph nodes.  Lungs:  Clear bilaterally.  Cardiac: Regular rate and rhythm with a normal S1 and S2.  There are no murmurs, rubs or bruits.  Abdomen:  Soft.  He has good bowel sounds.  There is no fluid wave.  There is no palpable hepatosplenomegaly.  Back.  No tenderness over the spine, ribs or hips.  Particularly, in the right hip area I cannot palpate any tenderness.  Extremities:  Shows no clubbing, cyanosis or edema.  Skin:  No rashes, ecchymosis or petechia.  LABORATORY STUDIES:  White cell count 6.7, hemoglobin 13.7, hematocrit 41.4, platelet count 187.  His BUN is 15, creatinine 1.3.  Alkaline phosphatase is 62.  IMPRESSION:  Mr. Allerton is a 67 year old gentleman with metastatic prostate cancer.  His last  scans were done back in April.  He had metastasis to the right sacrum.  Outside of that, no other metastatic disease was noted.  His last CT scans were also done in April.  There was no organ involvement of lymphadenopathy.  IMPRESSION:  Mr. Seufert is a 66 year old gentleman with metastatic prostate cancer.  So far, his disease has been holding pretty stable.  We will see what his PSA is.  I think this will really "drive" our decision making.  If his PSA is elevated more, we may have to repeat his scans.  If we find that there is significant progression, then we will switch him over to [facility].  Another option for him, since he is asymptomatic, would be radium-223.  We will plan to get Mr. Irion back in another month or so.    ______________________________ Josph Macho, M.D. PRE/MEDQ  D:  10/17/2012  T:  10/17/2012  Job:  [id number]

## 2012-10-17 NOTE — Patient Instructions (Signed)
Denosumab injection What is this medicine? DENOSUMAB slows bone breakdown. It is used to treat osteoporosis in women after menopause and in men. This medicine is also used to prevent bone fractures and other bone problems caused by cancer bone metastases. This medicine may be used for other purposes; ask your health care provider or pharmacist if you have questions. What should I tell my health care provider before I take this medicine? They need to know if you have any of these conditions: -dental disease -eczema -infection or history of infections -kidney disease or on dialysis -low blood calcium or vitamin D -malabsorption syndrome -scheduled to have surgery or tooth extraction -taking medicine that contains denosumab -thyroid or parathyroid disease -an unusual reaction to denosumab, other medicines, foods, dyes, or preservatives -pregnant or trying to get pregnant -breast-feeding How should I use this medicine? This medicine is for injection under the skin. It is given by a health care professional in a hospital or clinic setting. If you are getting Prolia, a special MedGuide will be given to you by the pharmacist with each prescription and refill. Be sure to read this information carefully each time. Talk to your pediatrician regarding the use of this medicine in children. Special care may be needed. Overdosage: If you think you've taken too much of this medicine contact a poison control center or emergency room at once. Overdosage: If you think you have taken too much of this medicine contact a poison control center or emergency room at once. NOTE: This medicine is only for you. Do not share this medicine with others. What if I miss a dose? It is important not to miss your dose. Call your doctor or health care professional if you are unable to keep an appointment. What may interact with this medicine? Do not take this medicine with any of the following medications: -other medicines  containing denosumab This medicine may also interact with the following medications: -medicines that suppress the immune system -medicines that treat cancer -steroid medicines like prednisone or cortisone This list may not describe all possible interactions. Give your health care provider a list of all the medicines, herbs, non-prescription drugs, or dietary supplements you use. Also tell them if you smoke, drink alcohol, or use illegal drugs. Some items may interact with your medicine. What should I watch for while using this medicine? Visit your doctor or health care professional for regular checks on your progress. Your doctor or health care professional may order blood tests and other tests to see how you are doing. Call your doctor or health care professional if you get a cold or other infection while receiving this medicine. Do not treat yourself. This medicine may decrease your body's ability to fight infection. You should make sure you get enough calcium and vitamin D while you are taking this medicine, unless your doctor tells you not to. Discuss the foods you eat and the vitamins you take with your health care professional. See your dentist regularly. Brush and floss your teeth as directed. Before you have any dental work done, tell your dentist you are receiving this medicine. What side effects may I notice from receiving this medicine? Side effects that you should report to your doctor or health care professional as soon as possible: -allergic reactions like skin rash, itching or hives, swelling of the face, lips, or tongue -breathing problems -chest pain -fast, irregular heartbeat -feeling faint or lightheaded, falls -fever, chills, or any other sign of infection -muscle spasms, tightening, or twitches -numbness   or tingling -skin blisters or bumps, or is dry, peels, or red -slow healing or unexplained pain in the mouth or jaw -unusual bleeding or bruising Side effects that  usually do not require medical attention (Report these to your doctor or health care professional if they continue or are bothersome.): -muscle pain -stomach upset, gas This list may not describe all possible side effects. Call your doctor for medical advice about side effects. You may report side effects to FDA at 1-800-FDA-1088. Where should I keep my medicine? This medicine is only given in a clinic, doctor's office, or other health care setting and will not be stored at home. NOTE: This sheet is a summary. It may not cover all possible information. If you have questions about this medicine, talk to your doctor, pharmacist, or health care provider.  2013, [employer]. (10/10/2010 3:40:41 PM) Degarelix injection What is this medicine? DEGARELIX (deg a REL ix) is used to treat men with advanced prostate cancer. This medicine may be used for other purposes; ask your health care provider or pharmacist if you have questions. What should I tell my health care provider before I take this medicine? They need to know if you have any of these conditions: -heart disease -kidney disease -liver disease -low levels of potassium or magnesium in the blood -osteoporosis -an unusual or allergic reaction to degarelix, mannitol, other medicines, foods, dyes, or preservatives -pregnant or trying to get pregnant -breast-feeding How should I use this medicine? This medicine is for injection under the skin. It is usually given by a health care professional in a hospital or clinic setting. If you get this medicine at home, you will be taught how to prepare and give this medicine. Use exactly as directed. Take your medicine at regular intervals. Do not take it more often than directed. It is important that you put your used needles and syringes in a special sharps container. Do not put them in a trash can. If you do not have a sharps container, call your pharmacist or healthcare provider to get  one. Talk to your pediatrician regarding the use of this medicine in children. Special care may be needed. Overdosage: If you think you've taken too much of this medicine contact a poison control center or emergency room at once. Overdosage: If you think you have taken too much of this medicine contact a poison control center or emergency room at once. NOTE: This medicine is only for you. Do not share this medicine with others. What if I miss a dose? Try not to miss a dose. If you do miss a dose, call your doctor or health care professional for advice. What may interact with this medicine? Do not take this medicine with any of the following medications: -amiodarone -bretylium -disopyramide -dofetilide -droperidol -ibutilide -procainamide -quinidine -sotalol This list may not describe all possible interactions. Give your health care provider a list of all the medicines, herbs, non-prescription drugs, or dietary supplements you use. Also tell them if you smoke, drink alcohol, or use illegal drugs. Some items may interact with your medicine. What should I watch for while using this medicine? Visit your doctor or health care professional for regular checks on your progress and discuss any issues before you start taking this medicine. Your doctor or health care professional will need to monitor your hormone levels in your blood to check your response to treatment. Try to keep any appointments for testing. What side effects may I notice from receiving this medicine? Side effects   that you should report to your doctor or health care professional as soon as possible: -allergic reactions like skin rash, itching or hives, swelling of the face, lips, or tongue -fever or chills -irregular heartbeat -nausea and vomiting along with severe abdominal pain -pain or difficulty passing urine -pelvic pain or bloating Side effects that usually do not require medical attention (Report these to your doctor or  health care professional if they continue or are bothersome.): -change in sex drive or performance -constipation -headache -high blood pressure - hot flashes (flushing of skin, increased sweating) - itching, redness or mild pain at site where injected -joint pain -trouble sleeping -unusually weak or tired -weight gain This list may not describe all possible side effects. Call your doctor for medical advice about side effects. You may report side effects to FDA at 1-800-FDA-1088. Where should I keep my medicine? Keep out of the reach of children. This drug is usually given in a hospital or clinic and will not be stored at home. In rare cases, this medicine may be given at home. If you are using this medicine at home, you will be instructed on how to store this medicine. Throw away any unused medicine after the expiration date on the label. NOTE: This sheet is a summary. It may not cover all possible information. If you have questions about this medicine, talk to your doctor, pharmacist, or health care provider.  2013, [employer]. (05/27/2007 4:41:07 PM)  

## 2012-11-14 ENCOUNTER — Ambulatory Visit (HOSPITAL_BASED_OUTPATIENT_CLINIC_OR_DEPARTMENT_OTHER): Payer: BC Managed Care – PPO

## 2012-11-14 ENCOUNTER — Other Ambulatory Visit (HOSPITAL_BASED_OUTPATIENT_CLINIC_OR_DEPARTMENT_OTHER): Payer: BC Managed Care – PPO | Admitting: Lab

## 2012-11-14 ENCOUNTER — Other Ambulatory Visit: Payer: Self-pay | Admitting: Hematology & Oncology

## 2012-11-14 ENCOUNTER — Ambulatory Visit (HOSPITAL_BASED_OUTPATIENT_CLINIC_OR_DEPARTMENT_OTHER): Payer: BC Managed Care – PPO | Admitting: Hematology & Oncology

## 2012-11-14 VITALS — BP 118/64 | HR 75 | Temp 98.2°F | Resp 18 | Ht 67.0 in | Wt 171.0 lb

## 2012-11-14 DIAGNOSIS — C7951 Secondary malignant neoplasm of bone: Secondary | ICD-10-CM

## 2012-11-14 DIAGNOSIS — C61 Malignant neoplasm of prostate: Secondary | ICD-10-CM

## 2012-11-14 LAB — CBC WITH DIFFERENTIAL (CANCER CENTER ONLY)
BASO#: 0 10*3/uL (ref 0.0–0.2)
BASO%: 0.4 % (ref 0.0–2.0)
EOS%: 1.5 % (ref 0.0–7.0)
Eosinophils Absolute: 0.1 10*3/uL (ref 0.0–0.5)
HCT: 41.4 % (ref 38.7–49.9)
HGB: 13.8 g/dL (ref 13.0–17.1)
LYMPH#: 1.8 10*3/uL (ref 0.9–3.3)
LYMPH%: 26.7 % (ref 14.0–48.0)
MCH: 31.9 pg (ref 28.0–33.4)
MCHC: 33.3 g/dL (ref 32.0–35.9)
MCV: 96 fL (ref 82–98)
MONO#: 0.6 10*3/uL (ref 0.1–0.9)
MONO%: 8.2 % (ref 0.0–13.0)
NEUT#: 4.3 10*3/uL (ref 1.5–6.5)
NEUT%: 63.2 % (ref 40.0–80.0)
Platelets: 208 10*3/uL (ref 145–400)
RBC: 4.32 10*6/uL (ref 4.20–5.70)
RDW: 12.8 % (ref 11.1–15.7)
WBC: 6.9 10*3/uL (ref 4.0–10.0)

## 2012-11-14 LAB — CMP (CANCER CENTER ONLY)
ALT(SGPT): 17 U/L (ref 10–47)
AST: 15 U/L (ref 11–38)
Albumin: 3.7 g/dL (ref 3.3–5.5)
Alkaline Phosphatase: 52 U/L (ref 26–84)
BUN, Bld: 12 mg/dL (ref 7–22)
CO2: 31 mEq/L (ref 18–33)
Calcium: 9.4 mg/dL (ref 8.0–10.3)
Chloride: 100 mEq/L (ref 98–108)
Creat: 1 mg/dl (ref 0.6–1.2)
Glucose, Bld: 117 mg/dL (ref 73–118)
Potassium: 3.8 mEq/L (ref 3.3–4.7)
Sodium: 139 mEq/L (ref 128–145)
Total Bilirubin: 0.6 mg/dl (ref 0.20–1.60)
Total Protein: 7.3 g/dL (ref 6.4–8.1)

## 2012-11-14 LAB — PSA: PSA: 0.39 ng/mL (ref <=4.00)

## 2012-11-14 MED ORDER — DENOSUMAB 120 MG/1.7ML ~~LOC~~ SOLN
120.0000 mg | Freq: Once | SUBCUTANEOUS | Status: AC
  Administered 2012-11-14: 120 mg via SUBCUTANEOUS
  Filled 2012-11-14: qty 1.7

## 2012-11-14 MED ORDER — DEGARELIX ACETATE 80 MG ~~LOC~~ SOLR
80.0000 mg | Freq: Once | SUBCUTANEOUS | Status: AC
  Administered 2012-11-14: 80 mg via SUBCUTANEOUS
  Filled 2012-11-14: qty 4

## 2012-11-14 NOTE — Patient Instructions (Signed)
Denosumab injection What is this medicine? DENOSUMAB slows bone breakdown. It is used to treat osteoporosis in women after menopause and in men. This medicine is also used to prevent bone fractures and other bone problems caused by cancer bone metastases. This medicine may be used for other purposes; ask your health care provider or pharmacist if you have questions. What should I tell my health care provider before I take this medicine? They need to know if you have any of these conditions: -dental disease -eczema -infection or history of infections -kidney disease or on dialysis -low blood calcium or vitamin D -malabsorption syndrome -scheduled to have surgery or tooth extraction -taking medicine that contains denosumab -thyroid or parathyroid disease -an unusual reaction to denosumab, other medicines, foods, dyes, or preservatives -pregnant or trying to get pregnant -breast-feeding How should I use this medicine? This medicine is for injection under the skin. It is given by a health care professional in a hospital or clinic setting. If you are getting Prolia, a special MedGuide will be given to you by the pharmacist with each prescription and refill. Be sure to read this information carefully each time. Talk to your pediatrician regarding the use of this medicine in children. Special care may be needed. Overdosage: If you think you've taken too much of this medicine contact a poison control center or emergency room at once. Overdosage: If you think you have taken too much of this medicine contact a poison control center or emergency room at once. NOTE: This medicine is only for you. Do not share this medicine with others. What if I miss a dose? It is important not to miss your dose. Call your doctor or health care professional if you are unable to keep an appointment. What may interact with this medicine? Do not take this medicine with any of the following medications: -other medicines  containing denosumab This medicine may also interact with the following medications: -medicines that suppress the immune system -medicines that treat cancer -steroid medicines like prednisone or cortisone This list may not describe all possible interactions. Give your health care provider a list of all the medicines, herbs, non-prescription drugs, or dietary supplements you use. Also tell them if you smoke, drink alcohol, or use illegal drugs. Some items may interact with your medicine. What should I watch for while using this medicine? Visit your doctor or health care professional for regular checks on your progress. Your doctor or health care professional may order blood tests and other tests to see how you are doing. Call your doctor or health care professional if you get a cold or other infection while receiving this medicine. Do not treat yourself. This medicine may decrease your body's ability to fight infection. You should make sure you get enough calcium and vitamin D while you are taking this medicine, unless your doctor tells you not to. Discuss the foods you eat and the vitamins you take with your health care professional. See your dentist regularly. Brush and floss your teeth as directed. Before you have any dental work done, tell your dentist you are receiving this medicine. What side effects may I notice from receiving this medicine? Side effects that you should report to your doctor or health care professional as soon as possible: -allergic reactions like skin rash, itching or hives, swelling of the face, lips, or tongue -breathing problems -chest pain -fast, irregular heartbeat -feeling faint or lightheaded, falls -fever, chills, or any other sign of infection -muscle spasms, tightening, or twitches -numbness  or tingling -skin blisters or bumps, or is dry, peels, or red -slow healing or unexplained pain in the mouth or jaw -unusual bleeding or bruising Side effects that  usually do not require medical attention (Report these to your doctor or health care professional if they continue or are bothersome.): -muscle pain -stomach upset, gas This list may not describe all possible side effects. Call your doctor for medical advice about side effects. You may report side effects to FDA at 1-800-FDA-1088. Where should I keep my medicine? This medicine is only given in a clinic, doctor's office, or other health care setting and will not be stored at home. NOTE: This sheet is a summary. It may not cover all possible information. If you have questions about this medicine, talk to your doctor, pharmacist, or health care provider.  2013, [employer]. (10/10/2010 3:40:41 PM) Degarelix injection What is this medicine? DEGARELIX (deg a REL ix) is used to treat men with advanced prostate cancer. This medicine may be used for other purposes; ask your health care provider or pharmacist if you have questions. What should I tell my health care provider before I take this medicine? They need to know if you have any of these conditions: -heart disease -kidney disease -liver disease -low levels of potassium or magnesium in the blood -osteoporosis -an unusual or allergic reaction to degarelix, mannitol, other medicines, foods, dyes, or preservatives -pregnant or trying to get pregnant -breast-feeding How should I use this medicine? This medicine is for injection under the skin. It is usually given by a health care professional in a hospital or clinic setting. If you get this medicine at home, you will be taught how to prepare and give this medicine. Use exactly as directed. Take your medicine at regular intervals. Do not take it more often than directed. It is important that you put your used needles and syringes in a special sharps container. Do not put them in a trash can. If you do not have a sharps container, call your pharmacist or healthcare provider to get  one. Talk to your pediatrician regarding the use of this medicine in children. Special care may be needed. Overdosage: If you think you've taken too much of this medicine contact a poison control center or emergency room at once. Overdosage: If you think you have taken too much of this medicine contact a poison control center or emergency room at once. NOTE: This medicine is only for you. Do not share this medicine with others. What if I miss a dose? Try not to miss a dose. If you do miss a dose, call your doctor or health care professional for advice. What may interact with this medicine? Do not take this medicine with any of the following medications: -amiodarone -bretylium -disopyramide -dofetilide -droperidol -ibutilide -procainamide -quinidine -sotalol This list may not describe all possible interactions. Give your health care provider a list of all the medicines, herbs, non-prescription drugs, or dietary supplements you use. Also tell them if you smoke, drink alcohol, or use illegal drugs. Some items may interact with your medicine. What should I watch for while using this medicine? Visit your doctor or health care professional for regular checks on your progress and discuss any issues before you start taking this medicine. Your doctor or health care professional will need to monitor your hormone levels in your blood to check your response to treatment. Try to keep any appointments for testing. What side effects may I notice from receiving this medicine? Side effects  that you should report to your doctor or health care professional as soon as possible: -allergic reactions like skin rash, itching or hives, swelling of the face, lips, or tongue -fever or chills -irregular heartbeat -nausea and vomiting along with severe abdominal pain -pain or difficulty passing urine -pelvic pain or bloating Side effects that usually do not require medical attention (Report these to your doctor or  health care professional if they continue or are bothersome.): -change in sex drive or performance -constipation -headache -high blood pressure - hot flashes (flushing of skin, increased sweating) - itching, redness or mild pain at site where injected -joint pain -trouble sleeping -unusually weak or tired -weight gain This list may not describe all possible side effects. Call your doctor for medical advice about side effects. You may report side effects to FDA at 1-800-FDA-1088. Where should I keep my medicine? Keep out of the reach of children. This drug is usually given in a hospital or clinic and will not be stored at home. In rare cases, this medicine may be given at home. If you are using this medicine at home, you will be instructed on how to store this medicine. Throw away any unused medicine after the expiration date on the label. NOTE: This sheet is a summary. It may not cover all possible information. If you have questions about this medicine, talk to your doctor, pharmacist, or health care provider.  2013, [employer]. (05/27/2007 4:41:07 PM)

## 2012-11-14 NOTE — Progress Notes (Signed)
This office note has been dictated.

## 2012-11-15 NOTE — Progress Notes (Signed)
CC:   Foye Deer, MD  DIAGNOSIS:  Metastatic prostate cancer, castrate resistant.  CURRENT THERAPY: 1. Ketoconazole 200 mg p.o. t.i.d. 2. Xgeva 120 mg subcu q. month. 3. Degarelix 80 mg subcu q. month.  INTERIM HISTORY:  [name] comes in for his followup.  He is doing quite well.  He has had no problems so far with the ketoconazole.  His liver tests have been okay.  He has had no problems with diarrhea.  His last PSA was down to 0.27.  He has had no bony pain.  He has had no cough or shortness of breath. There has been no leg swelling.  He has had no rashes.  Overall, his performance status is ECOG 0.  PHYSICAL EXAM:  General:  This is a well-developed, well-nourished white gentleman in no obvious distress.  Vital Signs:  Show a temperature of 98.2, pulse 75, respiratory rate 18, blood pressure 118/64.  Weight is 171 pounds.  Head and Neck:  Show a normocephalic, atraumatic skull. There are no ocular nor oral lesions.  There are no palpable cervical or supraclavicular lymph nodes.  Lungs:  Clear bilaterally.  Cardiac: Regular rate and rhythm with a normal S1 and S2.  There are no murmurs, rubs, or bruits.  Abdomen:  Soft.  He has good bowel sounds.  There is no fluid wave.  He has no palpable hepatosplenomegaly.  Back:  No tenderness over the spine, ribs, or hips.  Extremities:  Show no clubbing, cyanosis, or edema.  Neurological:  Shows no focal neurological deficits.  LABORATORY STUDIES:  White cell count is 6.9.  Hemoglobin 13.8, hematocrit 41.4, platelet count 208.  IMPRESSION:  [name] is a 67 year old gentleman with castrate- resistant prostate cancer.  He has done incredibly well.  He is on ketoconazole.  His PSA has remained nice and low.  We will go ahead and plan to get him back in another 5 weeks or so.  I think we can probably move his appointments out a little bit longer now.  Of note, he is castrate resistant with his testosterone level less  than 20.   ______________________________ Josph Macho, M.D. PRE/MEDQ  D:  11/14/2012  T:  11/15/2012  Job:  [id number]

## 2012-12-04 ENCOUNTER — Other Ambulatory Visit: Payer: Self-pay | Admitting: Hematology & Oncology

## 2012-12-12 ENCOUNTER — Ambulatory Visit: Payer: BC Managed Care – PPO | Admitting: Hematology & Oncology

## 2012-12-12 ENCOUNTER — Ambulatory Visit: Payer: BC Managed Care – PPO

## 2012-12-12 ENCOUNTER — Other Ambulatory Visit: Payer: BC Managed Care – PPO | Admitting: Lab

## 2012-12-19 ENCOUNTER — Ambulatory Visit (HOSPITAL_BASED_OUTPATIENT_CLINIC_OR_DEPARTMENT_OTHER): Payer: Medicare Other | Admitting: Hematology & Oncology

## 2012-12-19 ENCOUNTER — Other Ambulatory Visit: Payer: Medicare Other | Admitting: Lab

## 2012-12-19 ENCOUNTER — Ambulatory Visit (HOSPITAL_BASED_OUTPATIENT_CLINIC_OR_DEPARTMENT_OTHER): Payer: Medicare Other

## 2012-12-19 VITALS — BP 121/70 | HR 78 | Temp 98.2°F | Resp 18 | Ht 67.0 in | Wt 172.0 lb

## 2012-12-19 DIAGNOSIS — R350 Frequency of micturition: Secondary | ICD-10-CM

## 2012-12-19 DIAGNOSIS — C61 Malignant neoplasm of prostate: Secondary | ICD-10-CM | POA: Diagnosis not present

## 2012-12-19 DIAGNOSIS — C7951 Secondary malignant neoplasm of bone: Secondary | ICD-10-CM | POA: Diagnosis not present

## 2012-12-19 DIAGNOSIS — Z5111 Encounter for antineoplastic chemotherapy: Secondary | ICD-10-CM

## 2012-12-19 LAB — CBC WITH DIFFERENTIAL (CANCER CENTER ONLY)
BASO#: 0 10*3/uL (ref 0.0–0.2)
BASO%: 0.4 % (ref 0.0–2.0)
EOS%: 2.1 % (ref 0.0–7.0)
Eosinophils Absolute: 0.1 10*3/uL (ref 0.0–0.5)
HCT: 42.2 % (ref 38.7–49.9)
HGB: 13.9 g/dL (ref 13.0–17.1)
LYMPH#: 1.8 10*3/uL (ref 0.9–3.3)
LYMPH%: 32.7 % (ref 14.0–48.0)
MCH: 31.7 pg (ref 28.0–33.4)
MCHC: 32.9 g/dL (ref 32.0–35.9)
MCV: 96 fL (ref 82–98)
MONO#: 0.4 10*3/uL (ref 0.1–0.9)
MONO%: 6.4 % (ref 0.0–13.0)
NEUT#: 3.3 10*3/uL (ref 1.5–6.5)
NEUT%: 58.4 % (ref 40.0–80.0)
Platelets: 217 10*3/uL (ref 145–400)
RBC: 4.38 10*6/uL (ref 4.20–5.70)
RDW: 13.1 % (ref 11.1–15.7)
WBC: 5.6 10*3/uL (ref 4.0–10.0)

## 2012-12-19 LAB — COMPREHENSIVE METABOLIC PANEL
ALT: 11 U/L (ref 0–53)
AST: 15 U/L (ref 0–37)
Albumin: 4.3 g/dL (ref 3.5–5.2)
Alkaline Phosphatase: 56 U/L (ref 39–117)
BUN: 15 mg/dL (ref 6–23)
CO2: 29 mEq/L (ref 19–32)
Calcium: 9.5 mg/dL (ref 8.4–10.5)
Chloride: 101 mEq/L (ref 96–112)
Creatinine, Ser: 1.1 mg/dL (ref 0.50–1.35)
Glucose, Bld: 159 mg/dL — ABNORMAL HIGH (ref 70–99)
Potassium: 3.8 mEq/L (ref 3.5–5.3)
Sodium: 139 mEq/L (ref 135–145)
Total Bilirubin: 0.5 mg/dL (ref 0.3–1.2)
Total Protein: 7.3 g/dL (ref 6.0–8.3)

## 2012-12-19 LAB — TESTOSTERONE: Testosterone: 17 ng/dL — ABNORMAL LOW (ref 300–890)

## 2012-12-19 LAB — PSA: PSA: 0.35 ng/mL (ref <=4.00)

## 2012-12-19 MED ORDER — SOLIFENACIN SUCCINATE 5 MG PO TABS: 5.0000 mg | ORAL_TABLET | Freq: Every day | ORAL | Status: DC

## 2012-12-19 MED ORDER — DENOSUMAB 120 MG/1.7ML ~~LOC~~ SOLN
120.0000 mg | Freq: Once | SUBCUTANEOUS | Status: AC
  Administered 2012-12-19: 120 mg via SUBCUTANEOUS
  Filled 2012-12-19: qty 1.7

## 2012-12-19 MED ORDER — DEGARELIX ACETATE 80 MG ~~LOC~~ SOLR
80.0000 mg | Freq: Once | SUBCUTANEOUS | Status: AC
Start: 2012-12-19 — End: 2012-12-19
  Administered 2012-12-19: 80 mg via SUBCUTANEOUS
  Filled 2012-12-19: qty 4

## 2012-12-19 NOTE — Progress Notes (Signed)
This office note has been dictated.

## 2012-12-19 NOTE — Patient Instructions (Signed)
Degarelix injection What is this medicine? DEGARELIX (deg a REL ix) is used to treat men with advanced prostate cancer. This medicine may be used for other purposes; ask your health care provider or pharmacist if you have questions. COMMON BRAND NAME(S): Degarelix, Firmagon What should I tell my health care provider before I take this medicine? They need to know if you have any of these conditions: -heart disease -kidney disease -liver disease -low levels of potassium or magnesium in the blood -osteoporosis -an unusual or allergic reaction to degarelix, mannitol, other medicines, foods, dyes, or preservatives -pregnant or trying to get pregnant -breast-feeding How should I use this medicine? This medicine is for injection under the skin. It is usually given by a health care professional in a hospital or clinic setting. If you get this medicine at home, you will be taught how to prepare and give this medicine. Use exactly as directed. Take your medicine at regular intervals. Do not take it more often than directed. It is important that you put your used needles and syringes in a special sharps container. Do not put them in a trash can. If you do not have a sharps container, call your pharmacist or healthcare provider to get one. Talk to your pediatrician regarding the use of this medicine in children. Special care may be needed. Overdosage: If you think you've taken too much of this medicine contact a poison control center or emergency room at once. Overdosage: If you think you have taken too much of this medicine contact a poison control center or emergency room at once. NOTE: This medicine is only for you. Do not share this medicine with others. What if I miss a dose? Try not to miss a dose. If you do miss a dose, call your doctor or health care professional for advice. What may interact with this medicine? Do not take this medicine with any of the following  medications: -amiodarone -bretylium -disopyramide -dofetilide -droperidol -ibutilide -procainamide -quinidine -sotalol This list may not describe all possible interactions. Give your health care provider a list of all the medicines, herbs, non-prescription drugs, or dietary supplements you use. Also tell them if you smoke, drink alcohol, or use illegal drugs. Some items may interact with your medicine. What should I watch for while using this medicine? Visit your doctor or health care professional for regular checks on your progress and discuss any issues before you start taking this medicine. Your doctor or health care professional will need to monitor your hormone levels in your blood to check your response to treatment. Try to keep any appointments for testing. What side effects may I notice from receiving this medicine? Side effects that you should report to your doctor or health care professional as soon as possible: -allergic reactions like skin rash, itching or hives, swelling of the face, lips, or tongue -fever or chills -irregular heartbeat -nausea and vomiting along with severe abdominal pain -pain or difficulty passing urine -pelvic pain or bloating Side effects that usually do not require medical attention (Report these to your doctor or health care professional if they continue or are bothersome.): -change in sex drive or performance -constipation -headache -high blood pressure - hot flashes (flushing of skin, increased sweating) - itching, redness or mild pain at site where injected -joint pain -trouble sleeping -unusually weak or tired -weight gain This list may not describe all possible side effects. Call your doctor for medical advice about side effects. You may report side effects to FDA at 1-800-FDA-1088.   Where should I keep my medicine? Keep out of the reach of children. This drug is usually given in a hospital or clinic and will not be stored at home. In rare  cases, this medicine may be given at home. If you are using this medicine at home, you will be instructed on how to store this medicine. Throw away any unused medicine after the expiration date on the label. NOTE: This sheet is a summary. It may not cover all possible information. If you have questions about this medicine, talk to your doctor, pharmacist, or health care provider.  2014, [employer] Standard. (2007-05-27 16:41:07) Denosumab injection What is this medicine? DENOSUMAB (den oh sue mab) slows bone breakdown. Prolia is used to treat osteoporosis in women after menopause and in men. Xgeva is used to prevent bone fractures and other bone problems caused by cancer bone metastases. Xgeva is also used to treat giant cell tumor of the bone. This medicine may be used for other purposes; ask your health care provider or pharmacist if you have questions. COMMON BRAND NAME(S): Prolia, XGEVA What should I tell my health care provider before I take this medicine? They need to know if you have any of these conditions: -dental disease -eczema -infection or history of infections -kidney disease or on dialysis -low blood calcium or vitamin D -malabsorption syndrome -scheduled to have surgery or tooth extraction -taking medicine that contains denosumab -thyroid or parathyroid disease -an unusual reaction to denosumab, other medicines, foods, dyes, or preservatives -pregnant or trying to get pregnant -breast-feeding How should I use this medicine? This medicine is for injection under the skin. It is given by a health care professional in a hospital or clinic setting. If you are getting Prolia, a special MedGuide will be given to you by the pharmacist with each prescription and refill. Be sure to read this information carefully each time. For Prolia, talk to your pediatrician regarding the use of this medicine in children. Special care may be needed. For Xgeva, talk to your pediatrician regarding  the use of this medicine in children. While this drug may be prescribed for children as young as 13 years for selected conditions, precautions do apply. Overdosage: If you think you've taken too much of this medicine contact a poison control center or emergency room at once. Overdosage: If you think you have taken too much of this medicine contact a poison control center or emergency room at once. NOTE: This medicine is only for you. Do not share this medicine with others. What if I miss a dose? It is important not to miss your dose. Call your doctor or health care professional if you are unable to keep an appointment. What may interact with this medicine? Do not take this medicine with any of the following medications: -other medicines containing denosumab This medicine may also interact with the following medications: -medicines that suppress the immune system -medicines that treat cancer -steroid medicines like prednisone or cortisone This list may not describe all possible interactions. Give your health care provider a list of all the medicines, herbs, non-prescription drugs, or dietary supplements you use. Also tell them if you smoke, drink alcohol, or use illegal drugs. Some items may interact with your medicine. What should I watch for while using this medicine? Visit your doctor or health care professional for regular checks on your progress. Your doctor or health care professional may order blood tests and other tests to see how you are doing. Call your doctor or health care   professional if you get a cold or other infection while receiving this medicine. Do not treat yourself. This medicine may decrease your body's ability to fight infection. You should make sure you get enough calcium and vitamin D while you are taking this medicine, unless your doctor tells you not to. Discuss the foods you eat and the vitamins you take with your health care professional. See your dentist regularly.  Brush and floss your teeth as directed. Before you have any dental work done, tell your dentist you are receiving this medicine. Do not become pregnant while taking this medicine or for 5 months after stopping it. Women should inform their doctor if they wish to become pregnant or think they might be pregnant. There is a potential for serious side effects to an unborn child. Talk to your health care professional or pharmacist for more information. What side effects may I notice from receiving this medicine? Side effects that you should report to your doctor or health care professional as soon as possible: -allergic reactions like skin rash, itching or hives, swelling of the face, lips, or tongue -breathing problems -chest pain -fast, irregular heartbeat -feeling faint or lightheaded, falls -fever, chills, or any other sign of infection -muscle spasms, tightening, or twitches -numbness or tingling -skin blisters or bumps, or is dry, peels, or red -slow healing or unexplained pain in the mouth or jaw -unusual bleeding or bruising Side effects that usually do not require medical attention (Report these to your doctor or health care professional if they continue or are bothersome.): -muscle pain -stomach upset, gas This list may not describe all possible side effects. Call your doctor for medical advice about side effects. You may report side effects to FDA at 1-800-FDA-1088. Where should I keep my medicine? This medicine is only given in a clinic, doctor's office, or other health care setting and will not be stored at home. NOTE: This sheet is a summary. It may not cover all possible information. If you have questions about this medicine, talk to your doctor, pharmacist, or health care provider.  2014, [employer] Standard. (2011-07-02 12:37:47)  

## 2012-12-22 NOTE — Progress Notes (Signed)
CC:   Foye Deer, MD  DIAGNOSIS:  Metastatic prostate cancer - castrate resistant.  CURRENT THERAPY: 1. Ketoconazole 200 mg p.o. t.i.d. 2. Xgeva 120 mg subcu every month. 3. Degarelix 80 mg subcu every month.  INTERIM HISTORY:  Mr. Mano comes in for his followup.  He is doing okay.  He is having a little bit of discomfort over in the right ribcage.  This only is in the morning.  His main complaint has been urinary frequency.  I am not sure as to why this is the case.  As far as I know, he does not have any kind of prostatic hypertrophy.  His last PSA was 0.39.  This is up a little bit from before.  He has had no diarrhea.  He has had no rashes.  He has had no cough or shortness of breath.  He has had no fever.  Overall, his performance status is ECOG 0.  PHYSICAL EXAMINATION:  General:  This is a well-developed, well- nourished white gentleman in no obvious distress.  Vital Signs: Temperature of 98.2, pulse 78, respiratory rate 18, blood pressure 121/70.  Weight is 172 pounds.  Head and Neck Exam:  Normocephalic, atraumatic skull.  There are no ocular or oral lesions.  There are no palpable cervical or supraclavicular lymph nodes.  Lungs:  Clear bilaterally.  Cardiac Exam:  Regular rate and rhythm with a normal S1 and S2.  There are no murmurs, rubs, or bruits.  Abdomen:  Soft.  He has good bowel sounds.  There is no fluid wave.  There is no palpable abdominal mass.  There is no palpable hepatosplenomegaly.  Back Exam: No tenderness over the ribcage.  He has no tenderness over the spine. Extremities:  No clubbing, cyanosis, or edema.  Muscle strength is symmetric in the upper and lower extremities.  Skin Exam:  No rashes. Neurological Exam:  No focal neurological deficits.  LABORATORY STUDIES:  White cell count is 5.6, hemoglobin 14, hematocrit 42, platelet count 217.  IMPRESSION:  Mr. Marcoux is a very nice 67 year old gentleman.  He has metastatic prostate cancer.   I would say that he is castrate resistant. He is on ketoconazole.  We actually have gotten fairly good mileage out of the ketoconazole.  We started this, I think, in June.  We will see what his next PSA is.  I think if this is up further then we probably will need to consider starting him on Zytiga.  I think this would be a reasonable way to go for him.  If we do find that his PSA is rising, then we will go ahead and repeat our scans on him.  We will try something for his urinary frequency.  I do not think he has any kind of infection.  There is no burning or blood.  We will plan to get him back after the new year now.    ______________________________ Josph Macho, M.D. PRE/MEDQ  D:  12/19/2012  T:  12/22/2012  Job:  [id number]

## 2012-12-23 ENCOUNTER — Telehealth: Payer: Self-pay | Admitting: Nurse Practitioner

## 2012-12-23 NOTE — Telephone Encounter (Signed)
Message copied by Glee Arvin on Tue Dec 23, 2012  4:07 PM ------      Message from: Arlan Organ R      Created: Mon Dec 22, 2012 10:04 PM       Call - PSA is stable at 0.35!!  This is great!!  Keep on with Ketoconazole!!!  Merry Christmas!!  Cindee Lame ------

## 2012-12-24 ENCOUNTER — Telehealth: Payer: Self-pay | Admitting: Nurse Practitioner

## 2012-12-24 NOTE — Telephone Encounter (Addendum)
Message copied by Glee Arvin on Wed Dec 24, 2012 11:14 AM ------      Message from: Arlan Organ R      Created: Mon Dec 22, 2012 10:04 PM       Call - PSA is stable at 0.35!!  This is great!!  Keep on with Ketoconazole!!!  Merry ChristmasCindee Lame ------Pt verbalized understanding and appreciation.

## 2013-01-16 ENCOUNTER — Ambulatory Visit (HOSPITAL_BASED_OUTPATIENT_CLINIC_OR_DEPARTMENT_OTHER): Payer: Medicare Other | Admitting: Hematology & Oncology

## 2013-01-16 ENCOUNTER — Ambulatory Visit (HOSPITAL_BASED_OUTPATIENT_CLINIC_OR_DEPARTMENT_OTHER): Payer: Medicare Other

## 2013-01-16 ENCOUNTER — Other Ambulatory Visit (HOSPITAL_BASED_OUTPATIENT_CLINIC_OR_DEPARTMENT_OTHER): Payer: Medicare Other | Admitting: Lab

## 2013-01-16 ENCOUNTER — Other Ambulatory Visit: Payer: Self-pay | Admitting: Hematology & Oncology

## 2013-01-16 VITALS — BP 112/74 | HR 83 | Temp 98.2°F | Ht 68.0 in | Wt 174.0 lb

## 2013-01-16 DIAGNOSIS — C7952 Secondary malignant neoplasm of bone marrow: Secondary | ICD-10-CM

## 2013-01-16 DIAGNOSIS — C61 Malignant neoplasm of prostate: Secondary | ICD-10-CM

## 2013-01-16 DIAGNOSIS — C7951 Secondary malignant neoplasm of bone: Secondary | ICD-10-CM

## 2013-01-16 DIAGNOSIS — Z5111 Encounter for antineoplastic chemotherapy: Secondary | ICD-10-CM | POA: Diagnosis not present

## 2013-01-16 LAB — CBC WITH DIFFERENTIAL (CANCER CENTER ONLY)
BASO#: 0 10*3/uL (ref 0.0–0.2)
BASO%: 0.4 % (ref 0.0–2.0)
EOS%: 1 % (ref 0.0–7.0)
Eosinophils Absolute: 0.1 10*3/uL (ref 0.0–0.5)
HCT: 41.4 % (ref 38.7–49.9)
HGB: 13.4 g/dL (ref 13.0–17.1)
LYMPH#: 1.4 10*3/uL (ref 0.9–3.3)
LYMPH%: 18.6 % (ref 14.0–48.0)
MCH: 31.5 pg (ref 28.0–33.4)
MCHC: 32.4 g/dL (ref 32.0–35.9)
MCV: 97 fL (ref 82–98)
MONO#: 0.6 10*3/uL (ref 0.1–0.9)
MONO%: 8 % (ref 0.0–13.0)
NEUT#: 5.6 10*3/uL (ref 1.5–6.5)
NEUT%: 72 % (ref 40.0–80.0)
Platelets: 207 10*3/uL (ref 145–400)
RBC: 4.26 10*6/uL (ref 4.20–5.70)
RDW: 13.1 % (ref 11.1–15.7)
WBC: 7.7 10*3/uL (ref 4.0–10.0)

## 2013-01-16 LAB — COMPREHENSIVE METABOLIC PANEL
ALT: 15 U/L (ref 0–53)
AST: 15 U/L (ref 0–37)
Albumin: 4.2 g/dL (ref 3.5–5.2)
Alkaline Phosphatase: 57 U/L (ref 39–117)
BUN: 14 mg/dL (ref 6–23)
CO2: 29 mEq/L (ref 19–32)
Calcium: 9.3 mg/dL (ref 8.4–10.5)
Chloride: 100 mEq/L (ref 96–112)
Creatinine, Ser: 1 mg/dL (ref 0.50–1.35)
Glucose, Bld: 106 mg/dL — ABNORMAL HIGH (ref 70–99)
Potassium: 4.3 mEq/L (ref 3.5–5.3)
Sodium: 139 mEq/L (ref 135–145)
Total Bilirubin: 0.4 mg/dL (ref 0.3–1.2)
Total Protein: 6.9 g/dL (ref 6.0–8.3)

## 2013-01-16 LAB — PSA: PSA: 0.5 ng/mL (ref <=4.00)

## 2013-01-16 MED ORDER — DEGARELIX ACETATE 80 MG ~~LOC~~ SOLR
80.0000 mg | Freq: Once | SUBCUTANEOUS | Status: AC
  Administered 2013-01-16: 80 mg via SUBCUTANEOUS
  Filled 2013-01-16: qty 4

## 2013-01-16 MED ORDER — DENOSUMAB 120 MG/1.7ML ~~LOC~~ SOLN
120.0000 mg | Freq: Once | SUBCUTANEOUS | Status: AC
  Administered 2013-01-16: 120 mg via SUBCUTANEOUS
  Filled 2013-01-16: qty 1.7

## 2013-01-16 MED ORDER — METHYLPREDNISOLONE SODIUM SUCC 125 MG IJ SOLR: 125.0000 mg | Freq: Once | INTRAMUSCULAR | Status: DC | PRN

## 2013-01-16 NOTE — Patient Instructions (Addendum)
Degarelix injection What is this medicine? DEGARELIX (deg a REL ix) is used to treat men with advanced prostate cancer. This medicine may be used for other purposes; ask your health care provider or pharmacist if you have questions. COMMON BRAND NAME(S): Degarelix, [place] What should I tell my health care provider before I take this medicine? They need to know if you have any of these conditions: -heart disease -kidney disease -liver disease -low levels of potassium or magnesium in the blood -osteoporosis -an unusual or allergic reaction to degarelix, mannitol, other medicines, foods, dyes, or preservatives -pregnant or trying to get pregnant -breast-feeding How should I use this medicine? This medicine is for injection under the skin. It is usually given by a health care professional in a hospital or clinic setting. If you get this medicine at home, you will be taught how to prepare and give this medicine. Use exactly as directed. Take your medicine at regular intervals. Do not take it more often than directed. It is important that you put your used needles and syringes in a special sharps container. Do not put them in a trash can. If you do not have a sharps container, call your pharmacist or healthcare provider to get one. Talk to your pediatrician regarding the use of this medicine in children. Special care may be needed. Overdosage: If you think you've taken too much of this medicine contact a poison control center or emergency room at once. Overdosage: If you think you have taken too much of this medicine contact a poison control center or emergency room at once. NOTE: This medicine is only for you. Do not share this medicine with others. What if I miss a dose? Try not to miss a dose. If you do miss a dose, call your doctor or health care professional for advice. What may interact with this medicine? Do not take this medicine with any of the following  medications: -amiodarone -bretylium -disopyramide -dofetilide -droperidol -ibutilide -procainamide -quinidine -sotalol This list may not describe all possible interactions. Give your health care provider a list of all the medicines, herbs, non-prescription drugs, or dietary supplements you use. Also tell them if you smoke, drink alcohol, or use illegal drugs. Some items may interact with your medicine. What should I watch for while using this medicine? Visit your doctor or health care professional for regular checks on your progress and discuss any issues before you start taking this medicine. Your doctor or health care professional will need to monitor your hormone levels in your blood to check your response to treatment. Try to keep any appointments for testing. What side effects may I notice from receiving this medicine? Side effects that you should report to your doctor or health care professional as soon as possible: -allergic reactions like skin rash, itching or hives, swelling of the face, lips, or tongue -fever or chills -irregular heartbeat -nausea and vomiting along with severe abdominal pain -pain or difficulty passing urine -pelvic pain or bloating Side effects that usually do not require medical attention (Report these to your doctor or health care professional if they continue or are bothersome.): -change in sex drive or performance -constipation -headache -high blood pressure - hot flashes (flushing of skin, increased sweating) - itching, redness or mild pain at site where injected -joint pain -trouble sleeping -unusually weak or tired -weight gain This list may not describe all possible side effects. Call your doctor for medical advice about side effects. You may report side effects to FDA at 1-800-FDA-1088.  Where should I keep my medicine? Keep out of the reach of children. This drug is usually given in a hospital or clinic and will not be stored at home. In rare  cases, this medicine may be given at home. If you are using this medicine at home, you will be instructed on how to store this medicine. Throw away any unused medicine after the expiration date on the label. NOTE: This sheet is a summary. It may not cover all possible information. If you have questions about this medicine, talk to your doctor, pharmacist, or health care provider.  2014, [employer] Standard. (2007-05-27 16:41:07) Denosumab injection What is this medicine? DENOSUMAB (den oh sue mab) slows bone breakdown. Prolia is used to treat osteoporosis in women after menopause and in men. Delton See is used to prevent bone fractures and other bone problems caused by cancer bone metastases. Delton See is also used to treat giant cell tumor of the bone. This medicine may be used for other purposes; ask your health care provider or pharmacist if you have questions. COMMON BRAND NAME(S): Prolia, XGEVA What should I tell my health care provider before I take this medicine? They need to know if you have any of these conditions: -dental disease -eczema -infection or history of infections -kidney disease or on dialysis -low blood calcium or vitamin D -malabsorption syndrome -scheduled to have surgery or tooth extraction -taking medicine that contains denosumab -thyroid or parathyroid disease -an unusual reaction to denosumab, other medicines, foods, dyes, or preservatives -pregnant or trying to get pregnant -breast-feeding How should I use this medicine? This medicine is for injection under the skin. It is given by a health care professional in a hospital or clinic setting. If you are getting Prolia, a special MedGuide will be given to you by the pharmacist with each prescription and refill. Be sure to read this information carefully each time. For Prolia, talk to your pediatrician regarding the use of this medicine in children. Special care may be needed. For Delton See, talk to your pediatrician regarding  the use of this medicine in children. While this drug may be prescribed for children as young as 13 years for selected conditions, precautions do apply. Overdosage: If you think you've taken too much of this medicine contact a poison control center or emergency room at once. Overdosage: If you think you have taken too much of this medicine contact a poison control center or emergency room at once. NOTE: This medicine is only for you. Do not share this medicine with others. What if I miss a dose? It is important not to miss your dose. Call your doctor or health care professional if you are unable to keep an appointment. What may interact with this medicine? Do not take this medicine with any of the following medications: -other medicines containing denosumab This medicine may also interact with the following medications: -medicines that suppress the immune system -medicines that treat cancer -steroid medicines like prednisone or cortisone This list may not describe all possible interactions. Give your health care provider a list of all the medicines, herbs, non-prescription drugs, or dietary supplements you use. Also tell them if you smoke, drink alcohol, or use illegal drugs. Some items may interact with your medicine. What should I watch for while using this medicine? Visit your doctor or health care professional for regular checks on your progress. Your doctor or health care professional may order blood tests and other tests to see how you are doing. Call your doctor or health care  professional if you get a cold or other infection while receiving this medicine. Do not treat yourself. This medicine may decrease your body's ability to fight infection. You should make sure you get enough calcium and vitamin D while you are taking this medicine, unless your doctor tells you not to. Discuss the foods you eat and the vitamins you take with your health care professional. See your dentist regularly.  Brush and floss your teeth as directed. Before you have any dental work done, tell your dentist you are receiving this medicine. Do not become pregnant while taking this medicine or for 5 months after stopping it. Women should inform their doctor if they wish to become pregnant or think they might be pregnant. There is a potential for serious side effects to an unborn child. Talk to your health care professional or pharmacist for more information. What side effects may I notice from receiving this medicine? Side effects that you should report to your doctor or health care professional as soon as possible: -allergic reactions like skin rash, itching or hives, swelling of the face, lips, or tongue -breathing problems -chest pain -fast, irregular heartbeat -feeling faint or lightheaded, falls -fever, chills, or any other sign of infection -muscle spasms, tightening, or twitches -numbness or tingling -skin blisters or bumps, or is dry, peels, or red -slow healing or unexplained pain in the mouth or jaw -unusual bleeding or bruising Side effects that usually do not require medical attention (Report these to your doctor or health care professional if they continue or are bothersome.): -muscle pain -stomach upset, gas This list may not describe all possible side effects. Call your doctor for medical advice about side effects. You may report side effects to FDA at 1-800-FDA-1088. Where should I keep my medicine? This medicine is only given in a clinic, doctor's office, or other health care setting and will not be stored at home. NOTE: This sheet is a summary. It may not cover all possible information. If you have questions about this medicine, talk to your doctor, pharmacist, or health care provider.  2014, [employer] Standard. (2011-07-02 12:37:47)

## 2013-01-16 NOTE — Progress Notes (Signed)
This office note has been dictated.

## 2013-01-17 NOTE — Progress Notes (Signed)
CC:   Michael Bucks, MD  DIAGNOSIS:  Metastatic prostate cancer-castrate resistant.  CURRENT THERAPY: 1. Ketoconazole 200 mg p.o. t.i.d. 2. Xgeva 120 mg subcu every month. 3. Degarelix 80 mg subcu every month.  INTERIM HISTORY:  [name] comes in for followup.  He has been doing pretty well.  We last saw him back in December.  At that point in time, his PSA was holding pretty stable at 0.35. His testosterone was castrate level of 17.  He is having some right rib pain and this actually is little improved from when I last saw him.  He has had no problems with diarrhea.  He has had no cough or shortness of breath.  He has had no nausea or vomiting.  There has been no bleeding.  He has had no leg swelling or rashes.  PHYSICAL EXAMINATION:  General:  This is a well-developed, well- nourished white gentleman, in no obvious distress.  Vital Signs: Temperature of 98.2, pulse 83, respiratory rate 16, blood pressure 112/74, weight is 174 pounds.  Head and Neck:  Normocephalic, atraumatic skull.  He has no ocular or oral lesions.  There are no palpable cervical or supraclavicular lymph nodes.  Lungs:  Clear bilaterally. Cardiac:  Regular rate and rhythm with a normal S1 and S2.  There are no murmurs, rubs, or bruits.  Abdomen:  Soft.  He has good bowel sounds. There is no fluid wave.  There is no palpable abdominal mass.  There is no palpable hepatosplenomegaly.  Back:  No tenderness over the spine, ribs, or hips.  Extremities:  Show no clubbing, cyanosis, or edema. Neurological:  Shows no focal neurological deficits.  Skin:  No rashes, ecchymosis, or petechia.  LABORATORY STUDIES:  White cell count 7.7, hemoglobin 13.4, hematocrit 41.4, platelet count 207.  IMPRESSION:  [name] is a very nice 68 year old gentleman.  He has metastatic prostate cancer.  He has had oligometastatic disease.  He is holding steady with his PSA.  He has a very good performance status.  For now, we  will go ahead and just continue him on the monthly degarelix and daily ketoconazole now.  I do not see that we need to do any scans on him right now. We will go ahead and plan to get him back in another 4 weeks.   ______________________________ [name], M.D. PRE/MEDQ  D:  01/16/2013  T:  01/17/2013  Job:  [id number]

## 2013-01-26 ENCOUNTER — Telehealth: Payer: Self-pay | Admitting: Hematology & Oncology

## 2013-01-26 NOTE — Telephone Encounter (Signed)
[facility] has provided this patient w a temporary supply of  KETOCONAZOLE TAB 200 MG on 01/16/2013.   P: [phone]      COPY SCANNED

## 2013-02-10 ENCOUNTER — Other Ambulatory Visit: Payer: Self-pay | Admitting: Hematology & Oncology

## 2013-02-13 ENCOUNTER — Ambulatory Visit (HOSPITAL_BASED_OUTPATIENT_CLINIC_OR_DEPARTMENT_OTHER): Payer: Medicare Other | Admitting: Hematology & Oncology

## 2013-02-13 ENCOUNTER — Encounter: Payer: Self-pay | Admitting: Hematology & Oncology

## 2013-02-13 ENCOUNTER — Ambulatory Visit (HOSPITAL_BASED_OUTPATIENT_CLINIC_OR_DEPARTMENT_OTHER): Payer: Medicare Other

## 2013-02-13 ENCOUNTER — Other Ambulatory Visit (HOSPITAL_BASED_OUTPATIENT_CLINIC_OR_DEPARTMENT_OTHER): Payer: Medicare Other | Admitting: Lab

## 2013-02-13 VITALS — BP 130/69 | HR 76 | Temp 97.9°F | Resp 18 | Ht 67.0 in | Wt 175.0 lb

## 2013-02-13 DIAGNOSIS — Z5111 Encounter for antineoplastic chemotherapy: Secondary | ICD-10-CM | POA: Diagnosis not present

## 2013-02-13 DIAGNOSIS — C7952 Secondary malignant neoplasm of bone marrow: Secondary | ICD-10-CM

## 2013-02-13 DIAGNOSIS — C61 Malignant neoplasm of prostate: Secondary | ICD-10-CM | POA: Diagnosis not present

## 2013-02-13 DIAGNOSIS — C7951 Secondary malignant neoplasm of bone: Secondary | ICD-10-CM | POA: Diagnosis not present

## 2013-02-13 DIAGNOSIS — H40009 Preglaucoma, unspecified, unspecified eye: Secondary | ICD-10-CM | POA: Diagnosis not present

## 2013-02-13 LAB — CBC WITH DIFFERENTIAL (CANCER CENTER ONLY)
BASO#: 0 10*3/uL (ref 0.0–0.2)
BASO%: 0.4 % (ref 0.0–2.0)
EOS%: 1.2 % (ref 0.0–7.0)
Eosinophils Absolute: 0.1 10*3/uL (ref 0.0–0.5)
HCT: 42.2 % (ref 38.7–49.9)
HGB: 13.6 g/dL (ref 13.0–17.1)
LYMPH#: 2.1 10*3/uL (ref 0.9–3.3)
LYMPH%: 23.9 % (ref 14.0–48.0)
MCH: 31.4 pg (ref 28.0–33.4)
MCHC: 32.2 g/dL (ref 32.0–35.9)
MCV: 98 fL (ref 82–98)
MONO#: 0.8 10*3/uL (ref 0.1–0.9)
MONO%: 9.5 % (ref 0.0–13.0)
NEUT#: 5.6 10*3/uL (ref 1.5–6.5)
NEUT%: 65 % (ref 40.0–80.0)
Platelets: 209 10*3/uL (ref 145–400)
RBC: 4.33 10*6/uL (ref 4.20–5.70)
RDW: 12.8 % (ref 11.1–15.7)
WBC: 8.6 10*3/uL (ref 4.0–10.0)

## 2013-02-13 LAB — CMP (CANCER CENTER ONLY)
ALT(SGPT): 13 U/L (ref 10–47)
AST: 26 U/L (ref 11–38)
Albumin: 3.7 g/dL (ref 3.3–5.5)
Alkaline Phosphatase: 65 U/L (ref 26–84)
BUN, Bld: 12 mg/dL (ref 7–22)
CO2: 33 mEq/L (ref 18–33)
Calcium: 9.2 mg/dL (ref 8.0–10.3)
Chloride: 100 mEq/L (ref 98–108)
Creat: 0.9 mg/dl (ref 0.6–1.2)
Glucose, Bld: 119 mg/dL — ABNORMAL HIGH (ref 73–118)
Potassium: 3.9 mEq/L (ref 3.3–4.7)
Sodium: 139 mEq/L (ref 128–145)
Total Bilirubin: 0.6 mg/dl (ref 0.20–1.60)
Total Protein: 7.3 g/dL (ref 6.4–8.1)

## 2013-02-13 LAB — PSA: PSA: 0.62 ng/mL (ref <=4.00)

## 2013-02-13 MED ORDER — DEGARELIX ACETATE 80 MG ~~LOC~~ SOLR
80.0000 mg | Freq: Once | SUBCUTANEOUS | Status: AC
Start: 2013-02-13 — End: 2013-02-13
  Administered 2013-02-13: 80 mg via SUBCUTANEOUS
  Filled 2013-02-13: qty 4

## 2013-02-13 MED ORDER — DENOSUMAB 120 MG/1.7ML ~~LOC~~ SOLN
120.0000 mg | Freq: Once | SUBCUTANEOUS | Status: AC
  Administered 2013-02-13: 120 mg via SUBCUTANEOUS
  Filled 2013-02-13: qty 1.7

## 2013-02-13 MED ORDER — DENOSUMAB 120 MG/1.7ML ~~LOC~~ SOLN: 120.0000 mg | Freq: Once | SUBCUTANEOUS | Status: DC

## 2013-02-13 NOTE — Patient Instructions (Signed)
Degarelix injection What is this medicine? DEGARELIX (deg a REL ix) is used to treat men with advanced prostate cancer. This medicine may be used for other purposes; ask your health care provider or pharmacist if you have questions. COMMON BRAND NAME(S): Degarelix, Mills Koller What should I tell my health care provider before I take this medicine? They need to know if you have any of these conditions: -heart disease -kidney disease -liver disease -low levels of potassium or magnesium in the blood -osteoporosis -an unusual or allergic reaction to degarelix, mannitol, other medicines, foods, dyes, or preservatives -pregnant or trying to get pregnant -breast-feeding How should I use this medicine? This medicine is for injection under the skin. It is usually given by a health care professional in a hospital or clinic setting. If you get this medicine at home, you will be taught how to prepare and give this medicine. Use exactly as directed. Take your medicine at regular intervals. Do not take it more often than directed. It is important that you put your used needles and syringes in a special sharps container. Do not put them in a trash can. If you do not have a sharps container, call your pharmacist or healthcare provider to get one. Talk to your pediatrician regarding the use of this medicine in children. Special care may be needed. Overdosage: If you think you've taken too much of this medicine contact a poison control center or emergency room at once. Overdosage: If you think you have taken too much of this medicine contact a poison control center or emergency room at once. NOTE: This medicine is only for you. Do not share this medicine with others. What if I miss a dose? Try not to miss a dose. If you do miss a dose, call your doctor or health care professional for advice. What may interact with this medicine? Do not take this medicine with any of the following  medications: -amiodarone -bretylium -disopyramide -dofetilide -droperidol -ibutilide -procainamide -quinidine -sotalol This list may not describe all possible interactions. Give your health care provider a list of all the medicines, herbs, non-prescription drugs, or dietary supplements you use. Also tell them if you smoke, drink alcohol, or use illegal drugs. Some items may interact with your medicine. What should I watch for while using this medicine? Visit your doctor or health care professional for regular checks on your progress and discuss any issues before you start taking this medicine. Your doctor or health care professional will need to monitor your hormone levels in your blood to check your response to treatment. Try to keep any appointments for testing. What side effects may I notice from receiving this medicine? Side effects that you should report to your doctor or health care professional as soon as possible: -allergic reactions like skin rash, itching or hives, swelling of the face, lips, or tongue -fever or chills -irregular heartbeat -nausea and vomiting along with severe abdominal pain -pain or difficulty passing urine -pelvic pain or bloating Side effects that usually do not require medical attention (Report these to your doctor or health care professional if they continue or are bothersome.): -change in sex drive or performance -constipation -headache -high blood pressure - hot flashes (flushing of skin, increased sweating) - itching, redness or mild pain at site where injected -joint pain -trouble sleeping -unusually weak or tired -weight gain This list may not describe all possible side effects. Call your doctor for medical advice about side effects. You may report side effects to FDA at 1-800-FDA-1088.  Where should I keep my medicine? Keep out of the reach of children. This drug is usually given in a hospital or clinic and will not be stored at home. In rare  cases, this medicine may be given at home. If you are using this medicine at home, you will be instructed on how to store this medicine. Throw away any unused medicine after the expiration date on the label. NOTE: This sheet is a summary. It may not cover all possible information. If you have questions about this medicine, talk to your doctor, pharmacist, or health care provider.  2014, Elsevier/Gold Standard. (2007-05-27 16:41:07) Denosumab injection What is this medicine? DENOSUMAB (den oh sue mab) slows bone breakdown. Prolia is used to treat osteoporosis in women after menopause and in men. Delton See is used to prevent bone fractures and other bone problems caused by cancer bone metastases. Delton See is also used to treat giant cell tumor of the bone. This medicine may be used for other purposes; ask your health care provider or pharmacist if you have questions. COMMON BRAND NAME(S): Prolia, XGEVA What should I tell my health care provider before I take this medicine? They need to know if you have any of these conditions: -dental disease -eczema -infection or history of infections -kidney disease or on dialysis -low blood calcium or vitamin D -malabsorption syndrome -scheduled to have surgery or tooth extraction -taking medicine that contains denosumab -thyroid or parathyroid disease -an unusual reaction to denosumab, other medicines, foods, dyes, or preservatives -pregnant or trying to get pregnant -breast-feeding How should I use this medicine? This medicine is for injection under the skin. It is given by a health care professional in a hospital or clinic setting. If you are getting Prolia, a special MedGuide will be given to you by the pharmacist with each prescription and refill. Be sure to read this information carefully each time. For Prolia, talk to your pediatrician regarding the use of this medicine in children. Special care may be needed. For Delton See, talk to your pediatrician regarding  the use of this medicine in children. While this drug may be prescribed for children as young as 13 years for selected conditions, precautions do apply. Overdosage: If you think you've taken too much of this medicine contact a poison control center or emergency room at once. Overdosage: If you think you have taken too much of this medicine contact a poison control center or emergency room at once. NOTE: This medicine is only for you. Do not share this medicine with others. What if I miss a dose? It is important not to miss your dose. Call your doctor or health care professional if you are unable to keep an appointment. What may interact with this medicine? Do not take this medicine with any of the following medications: -other medicines containing denosumab This medicine may also interact with the following medications: -medicines that suppress the immune system -medicines that treat cancer -steroid medicines like prednisone or cortisone This list may not describe all possible interactions. Give your health care provider a list of all the medicines, herbs, non-prescription drugs, or dietary supplements you use. Also tell them if you smoke, drink alcohol, or use illegal drugs. Some items may interact with your medicine. What should I watch for while using this medicine? Visit your doctor or health care professional for regular checks on your progress. Your doctor or health care professional may order blood tests and other tests to see how you are doing. Call your doctor or health care  professional if you get a cold or other infection while receiving this medicine. Do not treat yourself. This medicine may decrease your body's ability to fight infection. You should make sure you get enough calcium and vitamin D while you are taking this medicine, unless your doctor tells you not to. Discuss the foods you eat and the vitamins you take with your health care professional. See your dentist regularly.  Brush and floss your teeth as directed. Before you have any dental work done, tell your dentist you are receiving this medicine. Do not become pregnant while taking this medicine or for 5 months after stopping it. Women should inform their doctor if they wish to become pregnant or think they might be pregnant. There is a potential for serious side effects to an unborn child. Talk to your health care professional or pharmacist for more information. What side effects may I notice from receiving this medicine? Side effects that you should report to your doctor or health care professional as soon as possible: -allergic reactions like skin rash, itching or hives, swelling of the face, lips, or tongue -breathing problems -chest pain -fast, irregular heartbeat -feeling faint or lightheaded, falls -fever, chills, or any other sign of infection -muscle spasms, tightening, or twitches -numbness or tingling -skin blisters or bumps, or is dry, peels, or red -slow healing or unexplained pain in the mouth or jaw -unusual bleeding or bruising Side effects that usually do not require medical attention (Report these to your doctor or health care professional if they continue or are bothersome.): -muscle pain -stomach upset, gas This list may not describe all possible side effects. Call your doctor for medical advice about side effects. You may report side effects to FDA at 1-800-FDA-1088. Where should I keep my medicine? This medicine is only given in a clinic, doctor's office, or other health care setting and will not be stored at home. NOTE: This sheet is a summary. It may not cover all possible information. If you have questions about this medicine, talk to your doctor, pharmacist, or health care provider.  2014, Elsevier/Gold Standard. (2011-07-02 12:37:47)

## 2013-02-16 NOTE — Progress Notes (Signed)
This office note has been dictated.

## 2013-02-17 NOTE — Progress Notes (Signed)
CC:   Michael Bucks, MD  DIAGNOSIS:  Metastatic prostate cancer-castrate resistant.  CURRENT THERAPY: 1. Ketoconazole 200 mg p.o. t.i.d. 2. Xgeva 120 mg subcu every month. 3. Degarelix 80 mg subcu every month. 4. Prednisone 5 mg p.o. daily.  INTERIM HISTORY:  [name] comes in for his followup.  He is feeling fairly well.  He is really not complaining any kind of pain.  He has been pretty active.  I think he is going to retire.  He has had no fever, sweats, or chills.  He has had no problems with bowels or bladder.  When we last saw him, his PSA was 0.5.  PHYSICAL EXAMINATION:  General:  This is a fairly well-developed, well- nourished white gentleman in no obvious distress.  Vital Signs: Temperature of 97.9, pulse 76, respiratory rate 18, blood pressure 130/69, weight is 175 pounds.  Head and Neck:  Normocephalic, atraumatic skull.  There are no ocular or oral lesions.  There are no palpable cervical or supraclavicular lymph nodes.  Lungs:  Clear bilaterally. Cardiac:  Regular rate and rhythm with a normal S1 and S2.  There are no murmurs, rubs, or bruits.  Abdomen:  Soft.  He has good bowel sounds. There is no fluid wave.  There is no palpable abdominal mass.  There is no palpable hepatosplenomegaly.  Back:  No tenderness over the spine, ribs, or hips.  Extremities:  No clubbing, cyanosis, or edema.  He has good range motion of his joints.  Neurological:  No focal neurological deficit.  LABORATORY STUDIES:  White cell count is 8.6, hemoglobin 13.6, hematocrit 42.2, platelet count 209.  PSA 0.62.  IMPRESSION:  [name] is a 68 year old gentleman.  He is castrate- resistant prostate cancer.  His testosterone levels are at castrate level.  His PSA is slowly declining.  He still appears pretty much asymptomatic.  I still think we can just watch.  I think if we have to move ahead with a change in therapy, then we will probably be considering extending  for Zytiga.  We will go ahead and get him back in 1 month as usual.    ______________________________ [name], M.D. PRE/MEDQ  D:  02/16/2013  T:  02/17/2013  Job:  [id number]

## 2013-03-13 ENCOUNTER — Telehealth: Payer: Self-pay | Admitting: Hematology & Oncology

## 2013-03-13 ENCOUNTER — Other Ambulatory Visit: Payer: Medicare Other | Admitting: Lab

## 2013-03-13 ENCOUNTER — Ambulatory Visit: Payer: Medicare Other | Admitting: Hematology & Oncology

## 2013-03-13 ENCOUNTER — Ambulatory Visit: Payer: Medicare Other

## 2013-03-13 NOTE — Telephone Encounter (Signed)
Pt scheduled 3-6 appointment from 2-26. I left RN message to triage

## 2013-03-20 ENCOUNTER — Ambulatory Visit (HOSPITAL_BASED_OUTPATIENT_CLINIC_OR_DEPARTMENT_OTHER): Payer: Medicare Other | Admitting: Hematology & Oncology

## 2013-03-20 ENCOUNTER — Other Ambulatory Visit (HOSPITAL_BASED_OUTPATIENT_CLINIC_OR_DEPARTMENT_OTHER): Payer: Medicare Other | Admitting: Lab

## 2013-03-20 ENCOUNTER — Ambulatory Visit (HOSPITAL_BASED_OUTPATIENT_CLINIC_OR_DEPARTMENT_OTHER): Payer: Medicare Other

## 2013-03-20 ENCOUNTER — Encounter: Payer: Self-pay | Admitting: Hematology & Oncology

## 2013-03-20 VITALS — BP 117/61 | HR 82 | Temp 97.9°F | Resp 18 | Ht 66.0 in | Wt 177.0 lb

## 2013-03-20 DIAGNOSIS — C61 Malignant neoplasm of prostate: Secondary | ICD-10-CM

## 2013-03-20 DIAGNOSIS — C7952 Secondary malignant neoplasm of bone marrow: Secondary | ICD-10-CM | POA: Diagnosis not present

## 2013-03-20 DIAGNOSIS — Z5111 Encounter for antineoplastic chemotherapy: Secondary | ICD-10-CM | POA: Diagnosis not present

## 2013-03-20 DIAGNOSIS — C7951 Secondary malignant neoplasm of bone: Secondary | ICD-10-CM

## 2013-03-20 LAB — CBC WITH DIFFERENTIAL (CANCER CENTER ONLY)
BASO#: 0 10*3/uL (ref 0.0–0.2)
BASO%: 0.4 % (ref 0.0–2.0)
EOS%: 1.9 % (ref 0.0–7.0)
Eosinophils Absolute: 0.2 10*3/uL (ref 0.0–0.5)
HCT: 41.5 % (ref 38.7–49.9)
HGB: 13.5 g/dL (ref 13.0–17.1)
LYMPH#: 2.2 10*3/uL (ref 0.9–3.3)
LYMPH%: 27.8 % (ref 14.0–48.0)
MCH: 31.1 pg (ref 28.0–33.4)
MCHC: 32.5 g/dL (ref 32.0–35.9)
MCV: 96 fL (ref 82–98)
MONO#: 0.7 10*3/uL (ref 0.1–0.9)
MONO%: 8.4 % (ref 0.0–13.0)
NEUT#: 4.7 10*3/uL (ref 1.5–6.5)
NEUT%: 61.5 % (ref 40.0–80.0)
Platelets: 226 10*3/uL (ref 145–400)
RBC: 4.34 10*6/uL (ref 4.20–5.70)
RDW: 12.8 % (ref 11.1–15.7)
WBC: 7.7 10*3/uL (ref 4.0–10.0)

## 2013-03-20 LAB — CMP (CANCER CENTER ONLY)
ALT(SGPT): 19 U/L (ref 10–47)
AST: 19 U/L (ref 11–38)
Albumin: 3.5 g/dL (ref 3.3–5.5)
Alkaline Phosphatase: 78 U/L (ref 26–84)
BUN, Bld: 15 mg/dL (ref 7–22)
CO2: 32 mEq/L (ref 18–33)
Calcium: 9.5 mg/dL (ref 8.0–10.3)
Chloride: 102 mEq/L (ref 98–108)
Creat: 1 mg/dl (ref 0.6–1.2)
Glucose, Bld: 122 mg/dL — ABNORMAL HIGH (ref 73–118)
Potassium: 3.8 mEq/L (ref 3.3–4.7)
Sodium: 137 mEq/L (ref 128–145)
Total Bilirubin: 0.5 mg/dl (ref 0.20–1.60)
Total Protein: 7.1 g/dL (ref 6.4–8.1)

## 2013-03-20 LAB — PSA: PSA: 0.82 ng/mL (ref <=4.00)

## 2013-03-20 MED ORDER — DEGARELIX ACETATE 80 MG ~~LOC~~ SOLR
80.0000 mg | Freq: Once | SUBCUTANEOUS | Status: AC
  Administered 2013-03-20: 80 mg via SUBCUTANEOUS
  Filled 2013-03-20: qty 4

## 2013-03-20 MED ORDER — DENOSUMAB 120 MG/1.7ML ~~LOC~~ SOLN
120.0000 mg | Freq: Once | SUBCUTANEOUS | Status: AC
  Administered 2013-03-20: 120 mg via SUBCUTANEOUS
  Filled 2013-03-20: qty 1.7

## 2013-03-20 NOTE — Patient Instructions (Signed)
Degarelix injection What is this medicine? DEGARELIX (deg a REL ix) is used to treat men with advanced prostate cancer. This medicine may be used for other purposes; ask your health care provider or pharmacist if you have questions. COMMON BRAND NAME(S): Degarelix, Mills Koller What should I tell my health care provider before I take this medicine? They need to know if you have any of these conditions: -heart disease -kidney disease -liver disease -low levels of potassium or magnesium in the blood -osteoporosis -an unusual or allergic reaction to degarelix, mannitol, other medicines, foods, dyes, or preservatives -pregnant or trying to get pregnant -breast-feeding How should I use this medicine? This medicine is for injection under the skin. It is usually given by a health care professional in a hospital or clinic setting. If you get this medicine at home, you will be taught how to prepare and give this medicine. Use exactly as directed. Take your medicine at regular intervals. Do not take it more often than directed. It is important that you put your used needles and syringes in a special sharps container. Do not put them in a trash can. If you do not have a sharps container, call your pharmacist or healthcare provider to get one. Talk to your pediatrician regarding the use of this medicine in children. Special care may be needed. Overdosage: If you think you've taken too much of this medicine contact a poison control center or emergency room at once. Overdosage: If you think you have taken too much of this medicine contact a poison control center or emergency room at once. NOTE: This medicine is only for you. Do not share this medicine with others. What if I miss a dose? Try not to miss a dose. If you do miss a dose, call your doctor or health care professional for advice. What may interact with this medicine? Do not take this medicine with any of the following  medications: -amiodarone -bretylium -disopyramide -dofetilide -droperidol -ibutilide -procainamide -quinidine -sotalol This list may not describe all possible interactions. Give your health care provider a list of all the medicines, herbs, non-prescription drugs, or dietary supplements you use. Also tell them if you smoke, drink alcohol, or use illegal drugs. Some items may interact with your medicine. What should I watch for while using this medicine? Visit your doctor or health care professional for regular checks on your progress and discuss any issues before you start taking this medicine. Your doctor or health care professional will need to monitor your hormone levels in your blood to check your response to treatment. Try to keep any appointments for testing. What side effects may I notice from receiving this medicine? Side effects that you should report to your doctor or health care professional as soon as possible: -allergic reactions like skin rash, itching or hives, swelling of the face, lips, or tongue -fever or chills -irregular heartbeat -nausea and vomiting along with severe abdominal pain -pain or difficulty passing urine -pelvic pain or bloating Side effects that usually do not require medical attention (Report these to your doctor or health care professional if they continue or are bothersome.): -change in sex drive or performance -constipation -headache -high blood pressure - hot flashes (flushing of skin, increased sweating) - itching, redness or mild pain at site where injected -joint pain -trouble sleeping -unusually weak or tired -weight gain This list may not describe all possible side effects. Call your doctor for medical advice about side effects. You may report side effects to FDA at 1-800-FDA-1088.  Where should I keep my medicine? Keep out of the reach of children. This drug is usually given in a hospital or clinic and will not be stored at home. In rare  cases, this medicine may be given at home. If you are using this medicine at home, you will be instructed on how to store this medicine. Throw away any unused medicine after the expiration date on the label. NOTE: This sheet is a summary. It may not cover all possible information. If you have questions about this medicine, talk to your doctor, pharmacist, or health care provider.  2014, Elsevier/Gold Standard. (2007-05-27 16:41:07) Denosumab injection What is this medicine? DENOSUMAB (den oh sue mab) slows bone breakdown. Prolia is used to treat osteoporosis in women after menopause and in men. Delton See is used to prevent bone fractures and other bone problems caused by cancer bone metastases. Delton See is also used to treat giant cell tumor of the bone. This medicine may be used for other purposes; ask your health care provider or pharmacist if you have questions. COMMON BRAND NAME(S): Prolia, XGEVA What should I tell my health care provider before I take this medicine? They need to know if you have any of these conditions: -dental disease -eczema -infection or history of infections -kidney disease or on dialysis -low blood calcium or vitamin D -malabsorption syndrome -scheduled to have surgery or tooth extraction -taking medicine that contains denosumab -thyroid or parathyroid disease -an unusual reaction to denosumab, other medicines, foods, dyes, or preservatives -pregnant or trying to get pregnant -breast-feeding How should I use this medicine? This medicine is for injection under the skin. It is given by a health care professional in a hospital or clinic setting. If you are getting Prolia, a special MedGuide will be given to you by the pharmacist with each prescription and refill. Be sure to read this information carefully each time. For Prolia, talk to your pediatrician regarding the use of this medicine in children. Special care may be needed. For Delton See, talk to your pediatrician regarding  the use of this medicine in children. While this drug may be prescribed for children as young as 13 years for selected conditions, precautions do apply. Overdosage: If you think you've taken too much of this medicine contact a poison control center or emergency room at once. Overdosage: If you think you have taken too much of this medicine contact a poison control center or emergency room at once. NOTE: This medicine is only for you. Do not share this medicine with others. What if I miss a dose? It is important not to miss your dose. Call your doctor or health care professional if you are unable to keep an appointment. What may interact with this medicine? Do not take this medicine with any of the following medications: -other medicines containing denosumab This medicine may also interact with the following medications: -medicines that suppress the immune system -medicines that treat cancer -steroid medicines like prednisone or cortisone This list may not describe all possible interactions. Give your health care provider a list of all the medicines, herbs, non-prescription drugs, or dietary supplements you use. Also tell them if you smoke, drink alcohol, or use illegal drugs. Some items may interact with your medicine. What should I watch for while using this medicine? Visit your doctor or health care professional for regular checks on your progress. Your doctor or health care professional may order blood tests and other tests to see how you are doing. Call your doctor or health care  professional if you get a cold or other infection while receiving this medicine. Do not treat yourself. This medicine may decrease your body's ability to fight infection. You should make sure you get enough calcium and vitamin D while you are taking this medicine, unless your doctor tells you not to. Discuss the foods you eat and the vitamins you take with your health care professional. See your dentist regularly.  Brush and floss your teeth as directed. Before you have any dental work done, tell your dentist you are receiving this medicine. Do not become pregnant while taking this medicine or for 5 months after stopping it. Women should inform their doctor if they wish to become pregnant or think they might be pregnant. There is a potential for serious side effects to an unborn child. Talk to your health care professional or pharmacist for more information. What side effects may I notice from receiving this medicine? Side effects that you should report to your doctor or health care professional as soon as possible: -allergic reactions like skin rash, itching or hives, swelling of the face, lips, or tongue -breathing problems -chest pain -fast, irregular heartbeat -feeling faint or lightheaded, falls -fever, chills, or any other sign of infection -muscle spasms, tightening, or twitches -numbness or tingling -skin blisters or bumps, or is dry, peels, or red -slow healing or unexplained pain in the mouth or jaw -unusual bleeding or bruising Side effects that usually do not require medical attention (Report these to your doctor or health care professional if they continue or are bothersome.): -muscle pain -stomach upset, gas This list may not describe all possible side effects. Call your doctor for medical advice about side effects. You may report side effects to FDA at 1-800-FDA-1088. Where should I keep my medicine? This medicine is only given in a clinic, doctor's office, or other health care setting and will not be stored at home. NOTE: This sheet is a summary. It may not cover all possible information. If you have questions about this medicine, talk to your doctor, pharmacist, or health care provider.  2014, Elsevier/Gold Standard. (2011-07-02 12:37:47)

## 2013-03-20 NOTE — Progress Notes (Signed)
  DIAGNOSIS: Metastatic prostate cancer-castrate resistant.   CURRENT THERAPY: 1. Ketoconazole 200 mg p.o. t.i.d. 2. Xgeva 120 mg subcu every month. 3. Degarelix 80 mg subcu every month. 4. Prednisone 5 mg p.o. daily.   INTERIM HISTORY:  He comes in for followup. It sounds like he had a viral gastroenteritis over the weekend. He had vomiting and diarrhea. This lasted one day. He lost 6 pounds. He is doing much better now. Had a little bit of fever afterwards.    He's had no bleeding. He's had no cough. He's had no leg swelling.    He says he may be having some night sweats.    His last PSA was going up. It was 0.62. For [name], this is elevated.    He's had some slight abdominal distention. I think this is reflective of low testosterone and been on prednisone.    He's had no rashes.   PHYSICAL EXAMINATION:  Well-developed well-nourished gentleman. His vital signs show temperature of 97.9. Pulse 82. Blood pressure 117/61. Weight 177. Lungs are clear. Cardiac exam regular in rhythm. Abdomen soft. Active bowel sounds. There is no fluid wave. There is no palpable liver or spleen. Extremities no clubbing cyanosis or edema. Skin exam no rashes. Neurological exam no focal neurological deficits.   LABORATORY STUDIES:  As above   IMPRESSION:  [name] is a 68 year old with metastatic prostate cancer. I would say that his probably castrate resistant.    I suspect that he is going to have to change. Probably will get him on Zytiga.    If we make a change, we also will probably need a bone scan and CT scans.    We will call him with the PSA result and then make changes.   [name], MD 03/20/2013

## 2013-03-24 ENCOUNTER — Other Ambulatory Visit: Payer: Self-pay | Admitting: Hematology & Oncology

## 2013-03-24 DIAGNOSIS — C61 Malignant neoplasm of prostate: Secondary | ICD-10-CM

## 2013-03-24 MED ORDER — ABIRATERONE ACETATE 250 MG PO TABS: 1000.0000 mg | ORAL_TABLET | Freq: Every day | ORAL | Status: DC

## 2013-03-25 ENCOUNTER — Telehealth: Payer: Self-pay | Admitting: Hematology & Oncology

## 2013-03-25 ENCOUNTER — Other Ambulatory Visit: Payer: Self-pay | Admitting: Hematology & Oncology

## 2013-03-25 NOTE — Telephone Encounter (Signed)
Pt aware of 3-19 CT and bone scan. He is aware to drink contrast at 830 and 930 am. He is also aware to be NPO 4 hrs prior

## 2013-04-02 ENCOUNTER — Ambulatory Visit (HOSPITAL_COMMUNITY)
Admission: RE | Admit: 2013-04-02 | Discharge: 2013-04-02 | Disposition: A | Discharge status: Home / Self Care | Payer: Medicare Other | Source: Ambulatory Visit | Attending: Hematology & Oncology | Admitting: Hematology & Oncology

## 2013-04-02 ENCOUNTER — Encounter (HOSPITAL_COMMUNITY)
Admission: RE | Admit: 2013-04-02 | Discharge: 2013-04-02 | Disposition: A | Discharge status: Home / Self Care | Payer: Medicare Other | Source: Ambulatory Visit | Attending: Hematology & Oncology | Admitting: Hematology & Oncology

## 2013-04-02 ENCOUNTER — Encounter (HOSPITAL_COMMUNITY): Payer: Self-pay

## 2013-04-02 DIAGNOSIS — K4090 Unilateral inguinal hernia, without obstruction or gangrene, not specified as recurrent: Secondary | ICD-10-CM | POA: Diagnosis not present

## 2013-04-02 DIAGNOSIS — M47817 Spondylosis without myelopathy or radiculopathy, lumbosacral region: Secondary | ICD-10-CM | POA: Diagnosis not present

## 2013-04-02 DIAGNOSIS — C7951 Secondary malignant neoplasm of bone: Secondary | ICD-10-CM | POA: Insufficient documentation

## 2013-04-02 DIAGNOSIS — R911 Solitary pulmonary nodule: Secondary | ICD-10-CM | POA: Insufficient documentation

## 2013-04-02 DIAGNOSIS — C61 Malignant neoplasm of prostate: Secondary | ICD-10-CM | POA: Insufficient documentation

## 2013-04-02 DIAGNOSIS — N200 Calculus of kidney: Secondary | ICD-10-CM | POA: Insufficient documentation

## 2013-04-02 DIAGNOSIS — N281 Cyst of kidney, acquired: Secondary | ICD-10-CM | POA: Insufficient documentation

## 2013-04-02 DIAGNOSIS — R972 Elevated prostate specific antigen [PSA]: Secondary | ICD-10-CM | POA: Diagnosis not present

## 2013-04-02 DIAGNOSIS — C7952 Secondary malignant neoplasm of bone marrow: Secondary | ICD-10-CM | POA: Diagnosis not present

## 2013-04-02 MED ORDER — TECHNETIUM TC 99M MEDRONATE IV KIT
23.4000 | PACK | Freq: Once | INTRAVENOUS | Status: AC | PRN
  Administered 2013-04-02: 23.4 via INTRAVENOUS

## 2013-04-02 MED ORDER — IOHEXOL 300 MG/ML  SOLN
100.0000 mL | Freq: Once | INTRAMUSCULAR | Status: AC | PRN
  Administered 2013-04-02: 100 mL via INTRAVENOUS

## 2013-04-03 ENCOUNTER — Telehealth: Payer: Self-pay | Admitting: *Deleted

## 2013-04-03 NOTE — Telephone Encounter (Signed)
Message copied by Rico Ala on Fri Apr 03, 2013  2:00 PM ------      Message from: Burney Gauze R      Created: Fri Apr 03, 2013  6:42 AM       Call - bone scan may show some slight increase in activity in the sacrum.  NO growth seen anywhere else!!  Laurey Arrow ------

## 2013-04-03 NOTE — Telephone Encounter (Signed)
Called patient to let him know that there is no obvious cancer in the lungs, liver, or lymph nodes per Ct scan. Pet scan showed slight increase in activity in sacrum but no where else

## 2013-04-09 ENCOUNTER — Telehealth: Payer: Self-pay | Admitting: Hematology & Oncology

## 2013-04-09 NOTE — Telephone Encounter (Signed)
Pt aware of 4-3 appointment and 3-27 cx

## 2013-04-10 ENCOUNTER — Ambulatory Visit: Payer: Medicare Other | Admitting: Hematology & Oncology

## 2013-04-10 ENCOUNTER — Ambulatory Visit: Payer: Medicare Other

## 2013-04-10 ENCOUNTER — Other Ambulatory Visit: Payer: Medicare Other | Admitting: Lab

## 2013-04-17 ENCOUNTER — Other Ambulatory Visit (HOSPITAL_BASED_OUTPATIENT_CLINIC_OR_DEPARTMENT_OTHER): Payer: Medicare Other | Admitting: Lab

## 2013-04-17 ENCOUNTER — Ambulatory Visit (HOSPITAL_BASED_OUTPATIENT_CLINIC_OR_DEPARTMENT_OTHER): Payer: Medicare Other | Admitting: Hematology & Oncology

## 2013-04-17 ENCOUNTER — Encounter: Payer: Self-pay | Admitting: Hematology & Oncology

## 2013-04-17 ENCOUNTER — Other Ambulatory Visit: Payer: Self-pay | Admitting: Hematology & Oncology

## 2013-04-17 ENCOUNTER — Ambulatory Visit (HOSPITAL_BASED_OUTPATIENT_CLINIC_OR_DEPARTMENT_OTHER): Payer: Medicare Other

## 2013-04-17 VITALS — BP 132/76 | HR 81 | Temp 98.4°F | Resp 16 | Ht 68.0 in | Wt 174.0 lb

## 2013-04-17 DIAGNOSIS — Z5111 Encounter for antineoplastic chemotherapy: Secondary | ICD-10-CM | POA: Diagnosis not present

## 2013-04-17 DIAGNOSIS — C61 Malignant neoplasm of prostate: Secondary | ICD-10-CM | POA: Diagnosis not present

## 2013-04-17 DIAGNOSIS — C7952 Secondary malignant neoplasm of bone marrow: Secondary | ICD-10-CM | POA: Diagnosis not present

## 2013-04-17 DIAGNOSIS — C7951 Secondary malignant neoplasm of bone: Secondary | ICD-10-CM

## 2013-04-17 LAB — CMP (CANCER CENTER ONLY)
ALT(SGPT): 18 U/L (ref 10–47)
AST: 22 U/L (ref 11–38)
Albumin: 3.5 g/dL (ref 3.3–5.5)
Alkaline Phosphatase: 48 U/L (ref 26–84)
BUN, Bld: 16 mg/dL (ref 7–22)
CO2: 31 mEq/L (ref 18–33)
Calcium: 9.3 mg/dL (ref 8.0–10.3)
Chloride: 103 mEq/L (ref 98–108)
Creat: 0.7 mg/dl (ref 0.6–1.2)
Glucose, Bld: 114 mg/dL (ref 73–118)
Potassium: 3.9 mEq/L (ref 3.3–4.7)
Sodium: 140 mEq/L (ref 128–145)
Total Bilirubin: 0.7 mg/dl (ref 0.20–1.60)
Total Protein: 6.9 g/dL (ref 6.4–8.1)

## 2013-04-17 LAB — CBC WITH DIFFERENTIAL (CANCER CENTER ONLY)
BASO#: 0 10*3/uL (ref 0.0–0.2)
BASO%: 0.3 % (ref 0.0–2.0)
EOS%: 1.7 % (ref 0.0–7.0)
Eosinophils Absolute: 0.1 10*3/uL (ref 0.0–0.5)
HCT: 40 % (ref 38.7–49.9)
HGB: 13.2 g/dL (ref 13.0–17.1)
LYMPH#: 1.6 10*3/uL (ref 0.9–3.3)
LYMPH%: 22.4 % (ref 14.0–48.0)
MCH: 31.7 pg (ref 28.0–33.4)
MCHC: 33 g/dL (ref 32.0–35.9)
MCV: 96 fL (ref 82–98)
MONO#: 0.8 10*3/uL (ref 0.1–0.9)
MONO%: 10.9 % (ref 0.0–13.0)
NEUT#: 4.6 10*3/uL (ref 1.5–6.5)
NEUT%: 64.7 % (ref 40.0–80.0)
Platelets: 222 10*3/uL (ref 145–400)
RBC: 4.16 10*6/uL — ABNORMAL LOW (ref 4.20–5.70)
RDW: 12.7 % (ref 11.1–15.7)
WBC: 7.1 10*3/uL (ref 4.0–10.0)

## 2013-04-17 MED ORDER — DENOSUMAB 120 MG/1.7ML ~~LOC~~ SOLN
120.0000 mg | Freq: Once | SUBCUTANEOUS | Status: AC
  Administered 2013-04-17: 120 mg via SUBCUTANEOUS
  Filled 2013-04-17: qty 1.7

## 2013-04-17 MED ORDER — LEUPROLIDE ACETATE (3 MONTH) 22.5 MG IM KIT
22.5000 mg | PACK | Freq: Once | INTRAMUSCULAR | Status: AC
Start: 2013-04-17 — End: 2013-04-17
  Administered 2013-04-17: 22.5 mg via INTRAMUSCULAR
  Filled 2013-04-17: qty 22.5

## 2013-04-18 LAB — PSA: PSA: 0.18 ng/mL (ref <=4.00)

## 2013-04-20 ENCOUNTER — Telehealth: Payer: Self-pay | Admitting: Nurse Practitioner

## 2013-04-20 ENCOUNTER — Telehealth: Payer: Self-pay | Admitting: Hematology & Oncology

## 2013-04-20 NOTE — Telephone Encounter (Signed)
Left message next appointment is 5-15

## 2013-04-20 NOTE — Progress Notes (Signed)
Hematology and Oncology Follow Up Visit  [name] [id number] January 02, 1946 68 y.o. 04/20/2013   Principle Diagnosis:  Metastatic castrate resistant prostate cancer Current Therapy:    Zytiga 1000 mg by mouth daily XGeva 120 g subcutaneous every 3 months Lupron 22.0 mg IM q. 3 months Prednisone 5 mg by mouth daily    Interim History:  [name] is back for followup. He now is on Zytiga. He's done well with this. He's had no problems toxicity. There is no cough. He's had no shortness of breath. There's been no diarrhea. She's had no fever sweats or chills. He's had no bony pain.  Awaiting we re- evaluated him with scans, he really had no significant progression of disease outside of the right sacrum. All the other scans looked stable.  His PSA in was going up. Although his PSA is within low, this increased has been consistent. His PSA was 0.82.  He's eating well. He still working. He is having no significant fatigue. There's been no leg swelling.  He's had no bleeding.  Medications: Current outpatient prescriptions:Calcium Carbonate-Vitamin D (CALCIUM + D PO), Take 500 mg by mouth 2 (two) times daily., Disp: , Rfl: ;  gabapentin (NEURONTIN) 100 MG capsule, TAKE 1 CAPSULE BY MOUTH EVERY 8 HOURS, Disp: 90 capsule, Rfl: 6;  Multiple Minerals-Vitamins (CALCIUM & VIT D3 BONE HEALTH PO), Take by mouth every morning. , Disp: , Rfl: ;  omeprazole (PRILOSEC) 20 MG capsule, Take 20 mg by mouth daily. , Disp: , Rfl:  OVER THE COUNTER MEDICATION, every morning. Juice Plus, Disp: , Rfl: ;  polyethylene glycol powder (MIRALAX) powder, Take 17 g by mouth daily., Disp: , Rfl: ;  predniSONE (DELTASONE) 5 MG tablet, TAKE 1 TABLET BY MOUTH ONCE DAILY, Disp: 60 tablet, Rfl: 0;  solifenacin (VESICARE) 5 MG tablet, Take 5 mg by mouth 3 (three) times a week., Disp: , Rfl:  traZODone (DESYREL) 50 MG tablet, TAKE 1 & 1/2 TABLETS (75 MG TOTAL) BY MOUTH AT BEDTIME., Disp: 45 tablet, Rfl: 4;  ZYTIGA 250 MG tablet,  TAKE 4 TABLETS (1,000 MG TOTAL) BY MOUTH DAILY. TAKE ON AN EMPTY STOMACH 1 HOUR BEFORE OR 2 HOURS AFTER A MEAL, Disp: 120 tablet, Rfl: 0  Allergies: No Known Allergies  Past Medical History, Surgical history, Social history, and Family History were reviewed and updated.  Review of Systems: As above  Physical Exam:  height is 5\' 8"  (1.727 m) and weight is 174 lb (78.926 kg). His oral temperature is 98.4 F (36.9 C). His blood pressure is 132/76 and his pulse is 81. His respiration is 16.   Well-developed and well-nourished white gentleman. Head and neck exam shows no ocular or oral lesions. Has no double cervical or supra-clavicular lymph nodes. Lungs are clear. Cardiac exam regular in rhythm with a normal S1 and S2. Abdomen soft. Good bowel sounds. No fluid wave. Is no palpable liver or spleen tip. Back exam no tenderness over the spine ribs or hips. Extremities shows no clubbing cyanosis or edema. Skin exam no rashes. Neurological exam no focal neurological deficits. Lab Results  Component Value Date   WBC 7.1 04/17/2013   HGB 13.2 04/17/2013   HCT 40.0 04/17/2013   MCV 96 04/17/2013   PLT 222 04/17/2013     Chemistry      Component Value Date/Time   NA 140 04/17/2013 1208   NA 139 01/16/2013 1117   K 3.9 04/17/2013 1208   K 4.3 01/16/2013 1117   CL  103 04/17/2013 1208   CL 100 01/16/2013 1117   CO2 31 04/17/2013 1208   CO2 29 01/16/2013 1117   BUN 16 04/17/2013 1208   BUN 14 01/16/2013 1117   CREATININE 0.7 04/17/2013 1208   CREATININE 1.00 01/16/2013 1117      Component Value Date/Time   CALCIUM 9.3 04/17/2013 1208   CALCIUM 9.3 01/16/2013 1117   ALKPHOS 48 04/17/2013 1208   ALKPHOS 57 01/16/2013 1117   AST 22 04/17/2013 1208   AST 15 01/16/2013 1117   ALT 18 04/17/2013 1208   ALT 15 01/16/2013 1117   BILITOT 0.70 04/17/2013 1208   BILITOT 0.4 01/16/2013 1117     PSA is down to 0.18    Impression and Plan: [name] is a 68 year old gentleman with metastatic castrate resistant prostate cancer. He is on Zytiga.  He tolerated this well. His PSA has come down very nicely.  I think we can now start moving his Lupron and Xgeva to every 3 months. His testosterone levels have always been very low.  I will see him back in 6 weeks to make sure that things continue to go well for him. I spent a good half hour with he and his wife. We went over the changes in the program. I went over his labs and x-rays.  Michael Napoleon, MD 4/6/20156:21 AM

## 2013-04-20 NOTE — Telephone Encounter (Signed)
Wife called stating pt was a little tired this weekend, had some n/v and a nose bleed. Spoke w/Dr. Marin Olp he advised that this would not likely be related to any treatment or medication he is receiving. Advised wife that PSa levels were down to 0.018 and she was excited. Encouraged her to continue to push fluids, take anti-emetics as prescribed and if nosebleed continues to contact office back. She verbalized understanding and appreciation.

## 2013-05-08 ENCOUNTER — Ambulatory Visit: Payer: Medicare Other

## 2013-05-08 ENCOUNTER — Other Ambulatory Visit: Payer: Medicare Other | Admitting: Lab

## 2013-05-08 ENCOUNTER — Ambulatory Visit: Payer: Medicare Other | Admitting: Hematology & Oncology

## 2013-05-25 ENCOUNTER — Other Ambulatory Visit: Payer: Self-pay | Admitting: Hematology & Oncology

## 2013-05-29 ENCOUNTER — Other Ambulatory Visit (HOSPITAL_BASED_OUTPATIENT_CLINIC_OR_DEPARTMENT_OTHER): Payer: Medicare Other | Admitting: Lab

## 2013-05-29 ENCOUNTER — Encounter: Payer: Self-pay | Admitting: Hematology & Oncology

## 2013-05-29 ENCOUNTER — Ambulatory Visit: Payer: Medicare Other

## 2013-05-29 ENCOUNTER — Ambulatory Visit (HOSPITAL_BASED_OUTPATIENT_CLINIC_OR_DEPARTMENT_OTHER): Payer: Medicare Other | Admitting: Hematology & Oncology

## 2013-05-29 VITALS — BP 143/76 | HR 79 | Temp 97.9°F | Resp 18 | Ht 69.0 in | Wt 175.0 lb

## 2013-05-29 DIAGNOSIS — C7951 Secondary malignant neoplasm of bone: Secondary | ICD-10-CM

## 2013-05-29 DIAGNOSIS — C7952 Secondary malignant neoplasm of bone marrow: Secondary | ICD-10-CM

## 2013-05-29 DIAGNOSIS — C61 Malignant neoplasm of prostate: Secondary | ICD-10-CM

## 2013-05-29 LAB — CMP (CANCER CENTER ONLY)
ALT(SGPT): 19 U/L (ref 10–47)
AST: 19 U/L (ref 11–38)
Albumin: 3.5 g/dL (ref 3.3–5.5)
Alkaline Phosphatase: 51 U/L (ref 26–84)
BUN, Bld: 14 mg/dL (ref 7–22)
CO2: 31 mEq/L (ref 18–33)
Calcium: 9.1 mg/dL (ref 8.0–10.3)
Chloride: 101 mEq/L (ref 98–108)
Creat: 1 mg/dl (ref 0.6–1.2)
Glucose, Bld: 154 mg/dL — ABNORMAL HIGH (ref 73–118)
Potassium: 3.9 mEq/L (ref 3.3–4.7)
Sodium: 141 mEq/L (ref 128–145)
Total Bilirubin: 0.7 mg/dl (ref 0.20–1.60)
Total Protein: 7 g/dL (ref 6.4–8.1)

## 2013-05-29 LAB — CBC WITH DIFFERENTIAL (CANCER CENTER ONLY)
BASO#: 0 10*3/uL (ref 0.0–0.2)
BASO%: 0.6 % (ref 0.0–2.0)
EOS%: 2 % (ref 0.0–7.0)
Eosinophils Absolute: 0.1 10*3/uL (ref 0.0–0.5)
HCT: 41.2 % (ref 38.7–49.9)
HGB: 13.7 g/dL (ref 13.0–17.1)
LYMPH#: 1.7 10*3/uL (ref 0.9–3.3)
LYMPH%: 27.3 % (ref 14.0–48.0)
MCH: 32.2 pg (ref 28.0–33.4)
MCHC: 33.3 g/dL (ref 32.0–35.9)
MCV: 97 fL (ref 82–98)
MONO#: 0.5 10*3/uL (ref 0.1–0.9)
MONO%: 7.8 % (ref 0.0–13.0)
NEUT#: 4 10*3/uL (ref 1.5–6.5)
NEUT%: 62.3 % (ref 40.0–80.0)
Platelets: 229 10*3/uL (ref 145–400)
RBC: 4.25 10*6/uL (ref 4.20–5.70)
RDW: 12.7 % (ref 11.1–15.7)
WBC: 6.4 10*3/uL (ref 4.0–10.0)

## 2013-05-29 NOTE — Progress Notes (Signed)
Hematology and Oncology Follow Up Visit  [name] [id number] Mar 01, 1945 68 y.o. 05/29/2013   Principle Diagnosis:  Metastatic castrate resistant prostate cancer Current Therapy:    Zytiga 1000 mg by mouth daily XGeva 120 g subcutaneous every 3 months Lupron 22.0 mg IM q. 3 months Prednisone 5 mg by mouth daily    Interim History:  [name] is back for followup. He now is on Zytiga. He's done well with this. He's had no problems toxicity. There is no cough. He's had no shortness of breath. There's been no diarrhea. She's had no fever sweats or chills. He's had no bony pain.  Awaiting we re- evaluated him with scans, he really had no significant progression of disease outside of the right sacrum. All the other scans looked stable.  His PSA in was going up. Although his PSA is within low, this increased has been consistent. His PSA was 0.82.  He's eating well. He still working. He is having no significant fatigue. There's been no leg swelling.  He's had no bleeding.  Medications: Current outpatient prescriptions:Calcium Carbonate-Vitamin D (CALCIUM + D PO), Take 500 mg by mouth 2 (two) times daily., Disp: , Rfl: ;  gabapentin (NEURONTIN) 100 MG capsule, TAKE 1 CAPSULE BY MOUTH EVERY 8 HOURS, Disp: 90 capsule, Rfl: 6;  Multiple Minerals-Vitamins (CALCIUM & VIT D3 BONE HEALTH PO), Take by mouth every morning. , Disp: , Rfl: ;  omeprazole (PRILOSEC) 20 MG capsule, Take 20 mg by mouth daily. , Disp: , Rfl:  OVER THE COUNTER MEDICATION, every morning. Juice Plus, Disp: , Rfl: ;  polyethylene glycol powder (MIRALAX) powder, Take 17 g by mouth daily., Disp: , Rfl: ;  predniSONE (DELTASONE) 5 MG tablet, TAKE 1 TABLET BY MOUTH ONCE DAILY, Disp: 60 tablet, Rfl: 0;  solifenacin (VESICARE) 5 MG tablet, Take 5 mg by mouth 3 (three) times a week., Disp: , Rfl:  traZODone (DESYREL) 50 MG tablet, TAKE 1 & 1/2 TABLETS BY MOUTH (75 MG TOTAL) BY MOUTH AT BEDTIME., Disp: 45 tablet, Rfl: 4;  ZYTIGA 250 MG  tablet, TAKE 4 TABLETS (1,000 MG TOTAL) BY MOUTH DAILY. TAKE ON AN EMPTY STOMACH 1 HOUR BEFORE OR 2 HOURS AFTER A MEAL, Disp: 120 tablet, Rfl: 0  Allergies: No Known Allergies  Past Medical History, Surgical history, Social history, and Family History were reviewed and updated.  Review of Systems: As above  Physical Exam:  height is 5\' 9"  (1.753 m) and weight is 175 lb (79.379 kg). His oral temperature is 97.9 F (36.6 C). His blood pressure is 143/76 and his pulse is 79. His respiration is 18.   Well-developed and well-nourished white gentleman. Head and neck exam shows no ocular or oral lesions. Has no double cervical or supra-clavicular lymph nodes. Lungs are clear. Cardiac exam regular in rhythm with a normal S1 and S2. Abdomen soft. Good bowel sounds. No fluid wave. Is no palpable liver or spleen tip. Back exam no tenderness over the spine ribs or hips. Extremities shows no clubbing cyanosis or edema. Skin exam no rashes. Neurological exam no focal neurological deficits. Lab Results  Component Value Date   WBC 6.4 05/29/2013   HGB 13.7 05/29/2013   HCT 41.2 05/29/2013   MCV 97 05/29/2013   PLT 229 05/29/2013     Chemistry      Component Value Date/Time   NA 141 05/29/2013 0825   NA 139 01/16/2013 1117   K 3.9 05/29/2013 0825   K 4.3 01/16/2013 1117  CL 101 05/29/2013 0825   CL 100 01/16/2013 1117   CO2 31 05/29/2013 0825   CO2 29 01/16/2013 1117   BUN 14 05/29/2013 0825   BUN 14 01/16/2013 1117   CREATININE 1.0 05/29/2013 0825   CREATININE 1.00 01/16/2013 1117      Component Value Date/Time   CALCIUM 9.1 05/29/2013 0825   CALCIUM 9.3 01/16/2013 1117   ALKPHOS 51 05/29/2013 0825   ALKPHOS 57 01/16/2013 1117   AST 19 05/29/2013 0825   AST 15 01/16/2013 1117   ALT 19 05/29/2013 0825   ALT 15 01/16/2013 1117   BILITOT 0.70 05/29/2013 0825   BILITOT 0.4 01/16/2013 1117     PSA is down to 0.18    Impression and Plan: [name] is a 68 year old gentleman with metastatic castrate resistant prostate  cancer. He is on Zytiga. He tolerated this well. His PSA has come down very nicely.  I think we can now start moving his Lupron and Xgeva to every 3 months. His testosterone levels have always been very low.  I will see him back in 6 weeks to make sure that things continue to go well for him. I spent a good half hour with he and his wife. We went over the changes in the program. I went over his labs and x-rays.  [name], MD 5/15/20159:24 AM

## 2013-05-30 LAB — PSA: PSA: 0.18 ng/mL (ref <=4.00)

## 2013-06-01 ENCOUNTER — Telehealth: Payer: Self-pay | Admitting: *Deleted

## 2013-06-01 NOTE — Telephone Encounter (Addendum)
Message copied by Orlando Penner on Mon Jun 01, 2013  9:25 AM ------      Message from: [name]      Created: Sun May 31, 2013  8:31 PM       Call - PSA is still very low.  It is 0.18.  Fantastic!!  Pete ------This message given to pt's wife.  Voiced understanding.

## 2013-06-05 ENCOUNTER — Ambulatory Visit: Payer: Medicare Other

## 2013-06-05 ENCOUNTER — Ambulatory Visit: Payer: Medicare Other | Admitting: Hematology & Oncology

## 2013-06-05 ENCOUNTER — Other Ambulatory Visit: Payer: Medicare Other | Admitting: Lab

## 2013-06-23 ENCOUNTER — Other Ambulatory Visit: Payer: Self-pay | Admitting: Hematology & Oncology

## 2013-07-10 ENCOUNTER — Ambulatory Visit (HOSPITAL_BASED_OUTPATIENT_CLINIC_OR_DEPARTMENT_OTHER): Payer: Medicare Other | Admitting: Hematology & Oncology

## 2013-07-10 ENCOUNTER — Ambulatory Visit (HOSPITAL_BASED_OUTPATIENT_CLINIC_OR_DEPARTMENT_OTHER): Payer: Medicare Other

## 2013-07-10 ENCOUNTER — Other Ambulatory Visit (HOSPITAL_BASED_OUTPATIENT_CLINIC_OR_DEPARTMENT_OTHER): Payer: Medicare Other | Admitting: Lab

## 2013-07-10 ENCOUNTER — Telehealth: Payer: Self-pay | Admitting: Hematology & Oncology

## 2013-07-10 DIAGNOSIS — R7309 Other abnormal glucose: Secondary | ICD-10-CM

## 2013-07-10 DIAGNOSIS — C61 Malignant neoplasm of prostate: Secondary | ICD-10-CM

## 2013-07-10 DIAGNOSIS — C7951 Secondary malignant neoplasm of bone: Secondary | ICD-10-CM | POA: Diagnosis not present

## 2013-07-10 DIAGNOSIS — R739 Hyperglycemia, unspecified: Secondary | ICD-10-CM

## 2013-07-10 DIAGNOSIS — C7952 Secondary malignant neoplasm of bone marrow: Secondary | ICD-10-CM

## 2013-07-10 DIAGNOSIS — Z5111 Encounter for antineoplastic chemotherapy: Secondary | ICD-10-CM

## 2013-07-10 DIAGNOSIS — T50905A Adverse effect of unspecified drugs, medicaments and biological substances, initial encounter: Principal | ICD-10-CM

## 2013-07-10 DIAGNOSIS — L03313 Cellulitis of chest wall: Secondary | ICD-10-CM

## 2013-07-10 LAB — CMP (CANCER CENTER ONLY)
ALT(SGPT): 21 U/L (ref 10–47)
AST: 23 U/L (ref 11–38)
Albumin: 3.6 g/dL (ref 3.3–5.5)
Alkaline Phosphatase: 55 U/L (ref 26–84)
BUN, Bld: 15 mg/dL (ref 7–22)
CO2: 31 mEq/L (ref 18–33)
Calcium: 9.6 mg/dL (ref 8.0–10.3)
Chloride: 101 mEq/L (ref 98–108)
Creat: 1.1 mg/dl (ref 0.6–1.2)
Glucose, Bld: 169 mg/dL — ABNORMAL HIGH (ref 73–118)
Potassium: 3.9 mEq/L (ref 3.3–4.7)
Sodium: 141 mEq/L (ref 128–145)
Total Bilirubin: 0.8 mg/dl (ref 0.20–1.60)
Total Protein: 7.2 g/dL (ref 6.4–8.1)

## 2013-07-10 LAB — CBC WITH DIFFERENTIAL (CANCER CENTER ONLY)
BASO#: 0 10*3/uL (ref 0.0–0.2)
BASO%: 0.7 % (ref 0.0–2.0)
EOS%: 2.6 % (ref 0.0–7.0)
Eosinophils Absolute: 0.2 10*3/uL (ref 0.0–0.5)
HCT: 42.7 % (ref 38.7–49.9)
HGB: 14.2 g/dL (ref 13.0–17.1)
LYMPH#: 1.6 10*3/uL (ref 0.9–3.3)
LYMPH%: 25.8 % (ref 14.0–48.0)
MCH: 31.8 pg (ref 28.0–33.4)
MCHC: 33.3 g/dL (ref 32.0–35.9)
MCV: 96 fL (ref 82–98)
MONO#: 0.5 10*3/uL (ref 0.1–0.9)
MONO%: 7.7 % (ref 0.0–13.0)
NEUT#: 3.9 10*3/uL (ref 1.5–6.5)
NEUT%: 63.2 % (ref 40.0–80.0)
Platelets: 221 10*3/uL (ref 145–400)
RBC: 4.47 10*6/uL (ref 4.20–5.70)
RDW: 12.8 % (ref 11.1–15.7)
WBC: 6.1 10*3/uL (ref 4.0–10.0)

## 2013-07-10 MED ORDER — LEUPROLIDE ACETATE (3 MONTH) 22.5 MG IM KIT
22.5000 mg | PACK | Freq: Once | INTRAMUSCULAR | Status: AC
  Administered 2013-07-10: 22.5 mg via INTRAMUSCULAR
  Filled 2013-07-10: qty 22.5

## 2013-07-10 MED ORDER — DENOSUMAB 120 MG/1.7ML ~~LOC~~ SOLN
120.0000 mg | Freq: Once | SUBCUTANEOUS | Status: AC
  Administered 2013-07-10: 120 mg via SUBCUTANEOUS
  Filled 2013-07-10: qty 1.7

## 2013-07-10 MED ORDER — GLIMEPIRIDE 1 MG PO TABS: 1.0000 mg | ORAL_TABLET | Freq: Every day | ORAL | Status: DC

## 2013-07-10 NOTE — Patient Instructions (Signed)
Denosumab injection What is this medicine? DENOSUMAB (den oh sue mab) slows bone breakdown. Prolia is used to treat osteoporosis in women after menopause and in men. Xgeva is used to prevent bone fractures and other bone problems caused by cancer bone metastases. Xgeva is also used to treat giant cell tumor of the bone. This medicine may be used for other purposes; ask your health care provider or pharmacist if you have questions. COMMON BRAND NAME(S): Prolia, XGEVA What should I tell my health care provider before I take this medicine? They need to know if you have any of these conditions: -dental disease -eczema -infection or history of infections -kidney disease or on dialysis -low blood calcium or vitamin D -malabsorption syndrome -scheduled to have surgery or tooth extraction -taking medicine that contains denosumab -thyroid or parathyroid disease -an unusual reaction to denosumab, other medicines, foods, dyes, or preservatives -pregnant or trying to get pregnant -breast-feeding How should I use this medicine? This medicine is for injection under the skin. It is given by a health care professional in a hospital or clinic setting. If you are getting Prolia, a special MedGuide will be given to you by the pharmacist with each prescription and refill. Be sure to read this information carefully each time. For Prolia, talk to your pediatrician regarding the use of this medicine in children. Special care may be needed. For Xgeva, talk to your pediatrician regarding the use of this medicine in children. While this drug may be prescribed for children as young as 13 years for selected conditions, precautions do apply. Overdosage: If you think you've taken too much of this medicine contact a poison control center or emergency room at once. Overdosage: If you think you have taken too much of this medicine contact a poison control center or emergency room at once. NOTE: This medicine is only for  you. Do not share this medicine with others. What if I miss a dose? It is important not to miss your dose. Call your doctor or health care professional if you are unable to keep an appointment. What may interact with this medicine? Do not take this medicine with any of the following medications: -other medicines containing denosumab This medicine may also interact with the following medications: -medicines that suppress the immune system -medicines that treat cancer -steroid medicines like prednisone or cortisone This list may not describe all possible interactions. Give your health care provider a list of all the medicines, herbs, non-prescription drugs, or dietary supplements you use. Also tell them if you smoke, drink alcohol, or use illegal drugs. Some items may interact with your medicine. What should I watch for while using this medicine? Visit your doctor or health care professional for regular checks on your progress. Your doctor or health care professional may order blood tests and other tests to see how you are doing. Call your doctor or health care professional if you get a cold or other infection while receiving this medicine. Do not treat yourself. This medicine may decrease your body's ability to fight infection. You should make sure you get enough calcium and vitamin D while you are taking this medicine, unless your doctor tells you not to. Discuss the foods you eat and the vitamins you take with your health care professional. See your dentist regularly. Brush and floss your teeth as directed. Before you have any dental work done, tell your dentist you are receiving this medicine. Do not become pregnant while taking this medicine or for 5 months after stopping   it. Women should inform their doctor if they wish to become pregnant or think they might be pregnant. There is a potential for serious side effects to an unborn child. Talk to your health care professional or pharmacist for more  information. What side effects may I notice from receiving this medicine? Side effects that you should report to your doctor or health care professional as soon as possible: -allergic reactions like skin rash, itching or hives, swelling of the face, lips, or tongue -breathing problems -chest pain -fast, irregular heartbeat -feeling faint or lightheaded, falls -fever, chills, or any other sign of infection -muscle spasms, tightening, or twitches -numbness or tingling -skin blisters or bumps, or is dry, peels, or red -slow healing or unexplained pain in the mouth or jaw -unusual bleeding or bruising Side effects that usually do not require medical attention (Report these to your doctor or health care professional if they continue or are bothersome.): -muscle pain -stomach upset, gas This list may not describe all possible side effects. Call your doctor for medical advice about side effects. You may report side effects to FDA at 1-800-FDA-1088. Where should I keep my medicine? This medicine is only given in a clinic, doctor's office, or other health care setting and will not be stored at home. NOTE: This sheet is a summary. It may not cover all possible information. If you have questions about this medicine, talk to your doctor, pharmacist, or health care provider.  2015, Elsevier/Gold Standard. (2011-07-02 12:37:47) Leuprolide injection What is this medicine? LEUPROLIDE (loo PROE lide) is a man-made hormone. It is used to treat the symptoms of prostate cancer. This medicine may also be used to treat children with early onset of puberty. It may be used for other hormonal conditions. This medicine may be used for other purposes; ask your health care provider or pharmacist if you have questions. COMMON BRAND NAME(S): Lupron What should I tell my health care provider before I take this medicine? They need to know if you have any of these conditions: -diabetes -heart disease or previous heart  attack -high blood pressure -high cholesterol -pain or difficulty passing urine -spinal cord metastasis -stroke -tobacco smoker -an unusual or allergic reaction to leuprolide, benzyl alcohol, other medicines, foods, dyes, or preservatives -pregnant or trying to get pregnant -breast-feeding How should I use this medicine? This medicine is for injection under the skin or into a muscle. You will be taught how to prepare and give this medicine. Use exactly as directed. Take your medicine at regular intervals. Do not take your medicine more often than directed. It is important that you put your used needles and syringes in a special sharps container. Do not put them in a trash can. If you do not have a sharps container, call your pharmacist or healthcare provider to get one. Talk to your pediatrician regarding the use of this medicine in children. While this medicine may be prescribed for children as young as 8 years for selected conditions, precautions do apply. Overdosage: If you think you have taken too much of this medicine contact a poison control center or emergency room at once. NOTE: This medicine is only for you. Do not share this medicine with others. What if I miss a dose? If you miss a dose, take it as soon as you can. If it is almost time for your next dose, take only that dose. Do not take double or extra doses. What may interact with this medicine? Do not take this medicine with  any of the following medications: -chasteberry This medicine may also interact with the following medications: -herbal or dietary supplements, like black cohosh or DHEA -male hormones, like estrogens or progestins and birth control pills, patches, rings, or injections -male hormones, like testosterone This list may not describe all possible interactions. Give your health care provider a list of all the medicines, herbs, non-prescription drugs, or dietary supplements you use. Also tell them if you smoke,  drink alcohol, or use illegal drugs. Some items may interact with your medicine. What should I watch for while using this medicine? Visit your doctor or health care professional for regular checks on your progress. During the first week, your symptoms may get worse, but then will improve as you continue your treatment. You may get hot flashes, increased bone pain, increased difficulty passing urine, or an aggravation of nerve symptoms. Discuss these effects with your doctor or health care professional, some of them may improve with continued use of this medicine. Male patients may experience a menstrual cycle or spotting during the first 2 months of therapy with this medicine. If this continues, contact your doctor or health care professional. What side effects may I notice from receiving this medicine? Side effects that you should report to your doctor or health care professional as soon as possible: -allergic reactions like skin rash, itching or hives, swelling of the face, lips, or tongue -breathing problems -chest pain -depression or memory disorders -pain in your legs or groin -pain at site where injected -severe headache -swelling of the feet and legs -visual changes -vomiting Side effects that usually do not require medical attention (report to your doctor or health care professional if they continue or are bothersome): -breast swelling or tenderness -decrease in sex drive or performance -diarrhea -hot flashes -loss of appetite -muscle, joint, or bone pains -nausea -redness or irritation at site where injected -skin problems or acne This list may not describe all possible side effects. Call your doctor for medical advice about side effects. You may report side effects to FDA at 1-800-FDA-1088. Where should I keep my medicine? Keep out of the reach of children. Store below 25 degrees C (77 degrees F). Do not freeze. Protect from light. Do not use if it is not clear or if there  are particles present. Throw away any unused medicine after the expiration date. NOTE: This sheet is a summary. It may not cover all possible information. If you have questions about this medicine, talk to your doctor, pharmacist, or health care provider.  2015, Elsevier/Gold Standard. (2008-05-18 13:26:20)

## 2013-07-10 NOTE — Telephone Encounter (Signed)
Pt aware of 7-2 bone scan at [facility] radiology.

## 2013-07-11 LAB — PSA: PSA: 0.21 ng/mL (ref <=4.00)

## 2013-07-12 NOTE — Progress Notes (Signed)
Hematology and Oncology Follow Up Visit  [name] [id number] 01-06-1946 68 y.o. 07/12/2013   Principle Diagnosis:   Metastatic castrate resistant prostate cancer  Current Therapy:    Zytiga 1000 mg by mouth daily  Xgeva 120 mg subcutaneous Q3 months  Lupron 22 mg IM every 3 months  Prednisone 5 mg by mouth daily     Interim History:  [name] is back for followup. He's doing okay. According to his wife, his have more problems with his right hip. He has had this before. We last did x-rays on this several months ago. These were unremarkable for a got of progressive disease.  His PSA has been holding steady. It was 0.18. This was similar as the last time we saw him.  He has had no other difficulties. There's no nausea vomiting. He's had no cough. Her blood sugars were on the high side. This probably is from the prednisone. We can change prednisone to every other day dosing. Present no diarrhea. He's had no fever. He's had no rashes. There's been no bleeding.  Medications: Current outpatient prescriptions:Calcium Carbonate-Vitamin D (CALCIUM + D PO), Take 500 mg by mouth 2 (two) times daily., Disp: , Rfl: ;  gabapentin (NEURONTIN) 100 MG capsule, TAKE 1 CAPSULE BY MOUTH EVERY 8 HOURS, Disp: 90 capsule, Rfl: 6;  Multiple Minerals-Vitamins (CALCIUM & VIT D3 BONE HEALTH PO), Take by mouth every morning. , Disp: , Rfl: ;  omeprazole (PRILOSEC) 20 MG capsule, Take 20 mg by mouth daily. , Disp: , Rfl:  OVER THE COUNTER MEDICATION, every morning. Juice Plus, Disp: , Rfl: ;  polyethylene glycol powder (MIRALAX) powder, Take 17 g by mouth daily., Disp: , Rfl: ;  predniSONE (DELTASONE) 5 MG tablet, TAKE 1 TABLET BY MOUTH ONCE DAILY, Disp: 60 tablet, Rfl: 0;  solifenacin (VESICARE) 5 MG tablet, Take 5 mg by mouth 3 (three) times a week., Disp: , Rfl:  traZODone (DESYREL) 50 MG tablet, TAKE 1 & 1/2 TABLETS BY MOUTH (75 MG TOTAL) BY MOUTH AT BEDTIME., Disp: 45 tablet, Rfl: 4;  ZYTIGA 250 MG tablet,  TAKE 4 TABLETS (1,000 MG TOTAL) BY MOUTH DAILY. TAKE ON AN EMPTY STOMACH 1 HOUR BEFORE OR 2 HOURS AFTER A MEAL, Disp: 120 tablet, Rfl: 0;  glimepiride (AMARYL) 1 MG tablet, Take 1 tablet (1 mg total) by mouth daily with breakfast., Disp: 30 tablet, Rfl: 4  Allergies: No Known Allergies  Past Medical History, Surgical history, Social history, and Family History were reviewed and updated.  Review of Systems: As above  Physical Exam:  vitals were not taken for this visit.  Well-developed and well-nourished white gentleman. Vital signs show temperature 97.9. Pulse 80. Blood pressure 129/67. Weight is 171 pounds. Head and neck exam shows no ocular or oral lesions. Has no palpable cervical or supraclavicular lymph nodes. Lungs are clear. Cardiac exam regular rate and rhythm with no murmurs rubs or bruits. Abdomen soft. Has good bowel sounds. There is no fluid wave. There is no palpable liver or spleen tip. Back exam no tenderness over the spine ribs or hips. I really cannot palpate any tenderness over the right hip area. Extremities shows no clubbing cyanosis or edema. Good range of motion of his joints. Has good strength. Skin exam no rashes. Neurological exam is nonfocal.  Lab Results  Component Value Date   WBC 6.1 07/10/2013   HGB 14.2 07/10/2013   HCT 42.7 07/10/2013   MCV 96 07/10/2013   PLT 221 07/10/2013  Chemistry      Component Value Date/Time   NA 141 07/10/2013 1003   NA 139 01/16/2013 1117   K 3.9 07/10/2013 1003   K 4.3 01/16/2013 1117   CL 101 07/10/2013 1003   CL 100 01/16/2013 1117   CO2 31 07/10/2013 1003   CO2 29 01/16/2013 1117   BUN 15 07/10/2013 1003   BUN 14 01/16/2013 1117   CREATININE 1.1 07/10/2013 1003   CREATININE 1.00 01/16/2013 1117      Component Value Date/Time   CALCIUM 9.6 07/10/2013 1003   CALCIUM 9.3 01/16/2013 1117   ALKPHOS 55 07/10/2013 1003   ALKPHOS 57 01/16/2013 1117   AST 23 07/10/2013 1003   AST 15 01/16/2013 1117   ALT 21 07/10/2013 1003   ALT 15 01/16/2013 1117    BILITOT 0.80 07/10/2013 1003   BILITOT 0.4 01/16/2013 1117       PSA is 0.21  Impression and Plan: [name] is a 68 year old gentleman. His metastatic castrate resistant prostate cancer. He is on Zytiga. So far has been no well with his. His PSA, from my point of view is still pretty stable.  We will get a bone scan on him. We will see what the bone scan shows a.  We may want to consider some radiation therapy to this area if he continues to have problems.  I will put him on some Amaryl to see about her blood sugars. I will put him on 1 mg a day.  I want to see him back in one month.     [name], MD 6/28/20152:13 PM

## 2013-07-15 DIAGNOSIS — L578 Other skin changes due to chronic exposure to nonionizing radiation: Secondary | ICD-10-CM | POA: Diagnosis not present

## 2013-07-15 DIAGNOSIS — W57XXXA Bitten or stung by nonvenomous insect and other nonvenomous arthropods, initial encounter: Secondary | ICD-10-CM | POA: Diagnosis not present

## 2013-07-15 DIAGNOSIS — T148 Other injury of unspecified body region: Secondary | ICD-10-CM | POA: Diagnosis not present

## 2013-07-15 DIAGNOSIS — L570 Actinic keratosis: Secondary | ICD-10-CM | POA: Diagnosis not present

## 2013-07-15 DIAGNOSIS — L821 Other seborrheic keratosis: Secondary | ICD-10-CM | POA: Diagnosis not present

## 2013-07-16 ENCOUNTER — Encounter (HOSPITAL_COMMUNITY)
Admission: RE | Admit: 2013-07-16 | Discharge: 2013-07-16 | Disposition: A | Discharge status: Home / Self Care | Payer: Medicare Other | Source: Ambulatory Visit | Attending: Hematology & Oncology | Admitting: Hematology & Oncology

## 2013-07-16 DIAGNOSIS — C7952 Secondary malignant neoplasm of bone marrow: Secondary | ICD-10-CM | POA: Diagnosis not present

## 2013-07-16 DIAGNOSIS — C61 Malignant neoplasm of prostate: Secondary | ICD-10-CM

## 2013-07-16 DIAGNOSIS — C7951 Secondary malignant neoplasm of bone: Secondary | ICD-10-CM | POA: Insufficient documentation

## 2013-07-16 DIAGNOSIS — R7309 Other abnormal glucose: Secondary | ICD-10-CM | POA: Diagnosis not present

## 2013-07-16 DIAGNOSIS — T50905A Adverse effect of unspecified drugs, medicaments and biological substances, initial encounter: Secondary | ICD-10-CM

## 2013-07-16 DIAGNOSIS — T50904A Poisoning by unspecified drugs, medicaments and biological substances, undetermined, initial encounter: Secondary | ICD-10-CM | POA: Insufficient documentation

## 2013-07-16 DIAGNOSIS — R739 Hyperglycemia, unspecified: Secondary | ICD-10-CM

## 2013-07-16 MED ORDER — TECHNETIUM TC 99M MEDRONATE IV KIT
25.0000 | PACK | Freq: Once | INTRAVENOUS | Status: AC | PRN
  Administered 2013-07-16: 25 via INTRAVENOUS

## 2013-07-20 MED ORDER — AMOXICILLIN-POT CLAVULANATE 875-125 MG PO TABS: 1.0000 | ORAL_TABLET | Freq: Two times a day (BID) | ORAL | Status: DC

## 2013-07-20 NOTE — Addendum Note (Signed)
Addended by: Burney Gauze R on: 07/20/2013 01:38 PM   Modules accepted: Orders

## 2013-07-22 NOTE — Progress Notes (Signed)
Histology and Location of Primary Cancer: Metastatic castrate resistant prostate cancer  Location(s) of Symptomatic tumor(s): left hip, lower back  Past/Anticipated chemotherapy by medical oncology, if any: Zytiga 1000 mg by mouth daily, Xgeva 120 mg subcutaneous Q3 months, Lupron 22 mg IM every 3 months, Prednisone 5 mg by mouth daily  Patient's main complaints related to symptomatic tumor(s) are: patient reports with sitting or with a lot of walking, his lower back and left hip are stiff.  He has trouble straightening up.  Pain on a scale of 0-10 is: 0  Ambulatory status? Walker? Wheelchair?: ambulatory  SAFETY ISSUES:  Prior radiation? no  Pacemaker/ICD? no  Possible current pregnancy? no  Is the patient on methotrexate? no  Additional Complaints / other details:  Patient is here with his wife.  He is a Clinical cytogeneticist.  He has 2 children.  His IPSS score today is 4.

## 2013-07-23 ENCOUNTER — Ambulatory Visit: Payer: Medicare Other | Admitting: Physical Therapy

## 2013-07-23 ENCOUNTER — Telehealth: Payer: Self-pay | Admitting: Oncology

## 2013-07-23 ENCOUNTER — Encounter: Payer: Self-pay | Admitting: Radiation Oncology

## 2013-07-23 ENCOUNTER — Other Ambulatory Visit: Payer: Self-pay | Admitting: Hematology & Oncology

## 2013-07-23 ENCOUNTER — Ambulatory Visit
Admission: RE | Admit: 2013-07-23 | Discharge: 2013-07-23 | Disposition: A | Discharge status: Home / Self Care | Payer: Medicare Other | Source: Ambulatory Visit | Attending: Radiation Oncology | Admitting: Radiation Oncology

## 2013-07-23 ENCOUNTER — Encounter: Payer: Self-pay | Admitting: *Deleted

## 2013-07-23 VITALS — BP 122/82 | HR 96 | Temp 98.6°F | Ht 69.0 in | Wt 172.1 lb

## 2013-07-23 DIAGNOSIS — IMO0002 Reserved for concepts with insufficient information to code with codable children: Secondary | ICD-10-CM | POA: Insufficient documentation

## 2013-07-23 DIAGNOSIS — C61 Malignant neoplasm of prostate: Secondary | ICD-10-CM

## 2013-07-23 DIAGNOSIS — Z79899 Other long term (current) drug therapy: Secondary | ICD-10-CM | POA: Insufficient documentation

## 2013-07-23 HISTORY — DX: Hyperglycemia, unspecified: R73.9

## 2013-07-23 NOTE — Progress Notes (Signed)
Please see the Nurse Progress Note in the MD Initial Consult Encounter for this patient. 

## 2013-07-23 NOTE — Progress Notes (Signed)
Radiation Oncology         [phone] ________________________________  Initial Outpatient Consultation  Name: [name] MRN: [id number]  Date: 07/23/2013  DOB: 12-06-1945  YQ:MVHQION,GEXBMWU E, MD  Nicoletta Dress, MD   REFERRING PHYSICIAN: Nicoletta Dress, MD  DIAGNOSIS: Metastatic prostate cancer  HISTORY OF PRESENT ILLNESS::[name] is a 68 y.o. male who is seen out of the courtesy of Dr. Marin Olp for evaluation as it relates to the patient's metastatic prostate cancer. Patient has a prior history of robotic-assisted laparoscopic prostatectomy in December of 2012. The Gleason score 7 at that time and 2 lymph nodes negative for tumor. Later date the patient's PSA began rising and he develop metastatic disease to the right sacral region.  Patient was then referred to medical oncology and seen in consultation 07/30/2011.  Current therapy includes the Zytiga, Delton See,  Lupron and prednisone.  Patient is been having some pain in the right upper pelvis region and a repeat bone scan showed activity in the right sacral area. With these findings the patient is now seen in radiation oncology for consultation in consideration for treatment.  PREVIOUS RADIATION THERAPY: No  PAST MEDICAL HISTORY:  has a past medical history of Prostate cancer (07/23/2011) and High blood sugar.    PAST SURGICAL HISTORY: Past Surgical History  Procedure Laterality Date  . Robot assisted laparoscopic radical prostatectomy    . Hand surgery Left   . Tonsilectomy/adenoidectomy with myringotomy    . Circumcision      when patient was 68 years old    FAMILY HISTORY: family history includes Prostate cancer in his father.  SOCIAL HISTORY:  reports that he has never smoked. He has never used smokeless tobacco. He reports that he does not drink alcohol.  ALLERGIES: Review of patient's allergies indicates no known allergies.  MEDICATIONS:  Current Outpatient Prescriptions  Medication Sig Dispense Refill    . amoxicillin-clavulanate (AUGMENTIN) 875-125 MG per tablet Take 1 tablet by mouth 2 (two) times daily.  14 tablet  0  . Calcium Carbonate-Vitamin D (CALCIUM + D PO) Take 500 mg by mouth 2 (two) times daily.      Marland Kitchen gabapentin (NEURONTIN) 100 MG capsule TAKE 1 CAPSULE BY MOUTH EVERY 8 HOURS  90 capsule  6  . glimepiride (AMARYL) 1 MG tablet Take 1 tablet (1 mg total) by mouth daily with breakfast.  30 tablet  4  . Multiple Minerals-Vitamins (CALCIUM & VIT D3 BONE HEALTH PO) Take by mouth every morning.       Marland Kitchen omeprazole (PRILOSEC) 20 MG capsule Take 20 mg by mouth daily.       Marland Kitchen OVER THE COUNTER MEDICATION every morning. Juice Plus      . polyethylene glycol powder (MIRALAX) powder Take 17 g by mouth daily.      . predniSONE (DELTASONE) 5 MG tablet TAKE 1 TABLET BY MOUTH ONCE DAILY  60 tablet  0  . solifenacin (VESICARE) 5 MG tablet Take 5 mg by mouth 3 (three) times a week.      . traZODone (DESYREL) 50 MG tablet TAKE 1 & 1/2 TABLETS BY MOUTH (75 MG TOTAL) BY MOUTH AT BEDTIME.  45 tablet  4  . ZYTIGA 250 MG tablet TAKE 4 TABLETS (1,000 MG TOTAL) BY MOUTH DAILY. TAKE ON AN EMPTY STOMACH 1 HOUR BEFORE OR 2 HOURS AFTER A MEAL  120 tablet  0   No current facility-administered medications for this encounter.    REVIEW OF SYSTEMS:  A 15  point review of systems is documented in the electronic medical record. This was obtained by the nursing staff. However, I reviewed this with the patient to discuss relevant findings and make appropriate changes.  This time the patient does complains of stiffness in his lower back in the early morning hours but no actual pain. He is not awakened at night with this issue. Patient denies any numbness or tingling in his lower extremities or focal motor weakness.   PHYSICAL EXAM:  height is 5\' 9"  (1.753 m) and weight is 172 lb 1.6 oz (78.064 kg). His oral temperature is 98.6 F (37 C). His blood pressure is 122/82 and his pulse is 96.  this is a very pleasant  healthy-appearing 68 year old gentleman in no acute distress. He is accompanied by his wife on evaluation today. The pupils are equal round and react to light. The extraocular eye movements are intact. The tongue is midline. There is no secondary infection noted the oral cavity or posterior pharynx. No palpable cervical supraclavicular or axillary adenopathy. The lungs are clear to auscultation. The heart has a regular rhythm and rate. The abdomen is soft and nontender with normal bowel sounds. On neurological examination motor strength is 5 out of 5 in the proximal and distal muscle groups in the upper lower extremities. Peripheral pulses are good. No cyanosis clubbing or edema is noted in the extremities.    ECOG = 0  0 - Asymptomatic (Fully active, able to carry on all predisease activities without restriction)   LABORATORY DATA:  Lab Results  Component Value Date   WBC 6.1 07/10/2013   HGB 14.2 07/10/2013   HCT 42.7 07/10/2013   MCV 96 07/10/2013   PLT 221 07/10/2013   NEUTROABS 3.9 07/10/2013   Lab Results  Component Value Date   NA 141 07/10/2013   K 3.9 07/10/2013   CL 101 07/10/2013   CO2 31 07/10/2013   GLUCOSE 169* 07/10/2013   CREATININE 1.1 07/10/2013   CALCIUM 9.6 07/10/2013   PSA June 26- 0.21   RADIOGRAPHY: Nm Bone Scan Whole Body  07/16/2013   CLINICAL DATA:  History of prostate carcinoma with right pelvic pain.  EXAM: NUCLEAR MEDICINE WHOLE BODY BONE SCAN  TECHNIQUE: Whole body anterior and posterior images were obtained approximately 3 hours after intravenous injection of radiopharmaceutical.  RADIOPHARMACEUTICALS:  25.0 mCi Technetium-99 MDP  COMPARISON:  04/02/2013  FINDINGS: Focus of increased radiotracer activity in the right sacrum is unchanged from the most recent prior study.  There are no other areas of radiotracer accumulation to suggest additional foci of metastatic disease.  Stable degenerative uptake is noted involving the sternoclavicular joints, knees and feet. Renal  uptake is symmetric.  IMPRESSION: 1. No change from the prior bone scan. 2. Single focus of uptake in the right sacrum consistent with stable metastatic disease. 3. No evidence of new metastatic disease.   Electronically Signed   By: Lajean Manes M.D.   On: 07/16/2013 16:41      IMPRESSION: Metastatic prostate cancer. At This time the patient is not having any significant pain in his right upper pelvis region.  I discussed potential treatment with the patient and he agrees with his lack of symptoms does not wish to proceed with treatment. At a later date the patient may be a potential candidate for radium 223 if he develops more significant osseous metastasis.  PLAN: Patient will be followed on a when necessary basis and will continue close followup in medical oncology  .   ------------------------------------------------  Blair Promise, PhD, MD

## 2013-07-23 NOTE — Telephone Encounter (Signed)
error 

## 2013-07-28 ENCOUNTER — Encounter: Payer: Self-pay | Admitting: *Deleted

## 2013-07-28 NOTE — Progress Notes (Signed)
Pt called questioning whether he could get a temporary dental crown at the dentist today. Informed pt that since the MD is on vacation, I could not give him a definitive answer. Informed pt he is on zytiga, and augmentin; his labs looked good last office visit. Pt verbalized understanding that we could not tell him today whether he should or shouldn't get this crown.

## 2013-08-07 ENCOUNTER — Other Ambulatory Visit: Payer: Self-pay | Admitting: Hematology & Oncology

## 2013-08-07 ENCOUNTER — Ambulatory Visit (HOSPITAL_BASED_OUTPATIENT_CLINIC_OR_DEPARTMENT_OTHER): Payer: Medicare Other | Admitting: Hematology & Oncology

## 2013-08-07 ENCOUNTER — Other Ambulatory Visit (HOSPITAL_BASED_OUTPATIENT_CLINIC_OR_DEPARTMENT_OTHER): Payer: Medicare Other | Admitting: Lab

## 2013-08-07 ENCOUNTER — Encounter: Payer: Self-pay | Admitting: Hematology & Oncology

## 2013-08-07 VITALS — BP 122/76 | HR 87 | Temp 98.3°F | Resp 18 | Ht 69.0 in | Wt 173.0 lb

## 2013-08-07 DIAGNOSIS — R739 Hyperglycemia, unspecified: Secondary | ICD-10-CM

## 2013-08-07 DIAGNOSIS — C7951 Secondary malignant neoplasm of bone: Secondary | ICD-10-CM | POA: Diagnosis not present

## 2013-08-07 DIAGNOSIS — C61 Malignant neoplasm of prostate: Secondary | ICD-10-CM

## 2013-08-07 DIAGNOSIS — C7952 Secondary malignant neoplasm of bone marrow: Secondary | ICD-10-CM

## 2013-08-07 DIAGNOSIS — R7309 Other abnormal glucose: Secondary | ICD-10-CM | POA: Diagnosis not present

## 2013-08-07 DIAGNOSIS — T50905A Adverse effect of unspecified drugs, medicaments and biological substances, initial encounter: Principal | ICD-10-CM

## 2013-08-07 DIAGNOSIS — T50904A Poisoning by unspecified drugs, medicaments and biological substances, undetermined, initial encounter: Secondary | ICD-10-CM | POA: Diagnosis not present

## 2013-08-07 LAB — CBC WITH DIFFERENTIAL (CANCER CENTER ONLY)
BASO#: 0 10*3/uL (ref 0.0–0.2)
BASO%: 0.7 % (ref 0.0–2.0)
EOS%: 2.6 % (ref 0.0–7.0)
Eosinophils Absolute: 0.2 10*3/uL (ref 0.0–0.5)
HCT: 40.5 % (ref 38.7–49.9)
HGB: 13.5 g/dL (ref 13.0–17.1)
LYMPH#: 1.4 10*3/uL (ref 0.9–3.3)
LYMPH%: 24.7 % (ref 14.0–48.0)
MCH: 32 pg (ref 28.0–33.4)
MCHC: 33.3 g/dL (ref 32.0–35.9)
MCV: 96 fL (ref 82–98)
MONO#: 0.3 10*3/uL (ref 0.1–0.9)
MONO%: 6 % (ref 0.0–13.0)
NEUT#: 3.7 10*3/uL (ref 1.5–6.5)
NEUT%: 66 % (ref 40.0–80.0)
Platelets: 219 10*3/uL (ref 145–400)
RBC: 4.22 10*6/uL (ref 4.20–5.70)
RDW: 12.9 % (ref 11.1–15.7)
WBC: 5.7 10*3/uL (ref 4.0–10.0)

## 2013-08-07 NOTE — Progress Notes (Signed)
Hematology and Oncology Follow Up Visit  [name] [id number] 1945-12-02 68 y.o. 08/07/2013   Principle Diagnosis:   Metastatic castrate resistant prostate cancer  Current Therapy:    Zytiga 1000 mg by mouth daily  Xgeva 120 mg subcutaneous Q3 months  Lupron 22 mg IM every 3 months     Interim History:  Mr.  [name] is back for followup. He did see radiation oncology. They did not feel that he needed radiation therapy to the right sacral region as he really was not having much in the way of pain. I am okay with this. As long as he is feeling well and not hurting, I don't see that we have to give radiation.  His last PSA was essentially stable at 0.21.  Appetite is okay. He has had no nausea vomiting. There's been no change in bowel or bladder habits.  He keeps getting it takes on him and his wife continues to find.   Medications: Current outpatient prescriptions:Calcium Carbonate-Vitamin D (CALCIUM + D PO), Take 500 mg by mouth 2 (two) times daily., Disp: , Rfl: ;  gabapentin (NEURONTIN) 100 MG capsule, twice a day, Disp: , Rfl: ;  glimepiride (AMARYL) 1 MG tablet, Take 1 tablet (1 mg total) by mouth daily with breakfast., Disp: 30 tablet, Rfl: 4;  Multiple Minerals-Vitamins (CALCIUM & VIT D3 BONE HEALTH PO), Take by mouth every morning. , Disp: , Rfl:  omeprazole (PRILOSEC) 20 MG capsule, Take 20 mg by mouth daily. , Disp: , Rfl: ;  OVER THE COUNTER MEDICATION, every morning. Juice Plus, Disp: , Rfl: ;  polyethylene glycol powder (MIRALAX) powder, Take 17 g by mouth daily., Disp: , Rfl: ;  solifenacin (VESICARE) 5 MG tablet, Take 5 mg by mouth 3 (three) times a week., Disp: , Rfl:  traZODone (DESYREL) 50 MG tablet, TAKE 1 & 1/2 TABLETS BY MOUTH (75 MG TOTAL) BY MOUTH AT BEDTIME., Disp: 45 tablet, Rfl: 4;  ZYTIGA 250 MG tablet, TAKE 4 TABLETS (1,000 MG TOTAL) BY MOUTH DAILY. TAKE ON AN EMPTY STOMACH 1 HOUR BEFORE OR 2 HOURS AFTER A MEAL, Disp: 120 tablet, Rfl: 0;  predniSONE  (DELTASONE) 5 MG tablet, TAKE 1 TABLET BY MOUTH ONCE DAILY, Disp: 60 tablet, Rfl: 0  Allergies: No Known Allergies  Past Medical History, Surgical history, Social history, and Family History were reviewed and updated.  Review of Systems: As above  Physical Exam:  height is 5\' 9"  (1.753 m) and weight is 173 lb (78.472 kg). His oral temperature is 98.3 F (36.8 C). His blood pressure is 122/76 and his pulse is 87. His respiration is 18.   Well-developed and well-nourished white woman. Head and neck exam shows no ocular or oral lesions. He has no palpable cervical or supraclavicular lymph nodes. Lungs are clear bilaterally. Cardiac exam regular rate and rhythm with a normal S1 and S2. There are no murmurs rubs or bruits. Abdomen is soft. Has good bowel sounds. There is no fluid wave. There is no palpable liver or spleen tip. Neck exam shows no tenderness over the spine ribs or hips. Extremities shows no clubbing cyanosis or edema. Has good strength in his legs. Has good range of motion of his joints. Neurological exam is non-focal.  Lab Results  Component Value Date   WBC 5.7 08/07/2013   HGB 13.5 08/07/2013   HCT 40.5 08/07/2013   MCV 96 08/07/2013   PLT 219 08/07/2013     Chemistry      Component Value Date/Time  NA 141 07/10/2013 1003   NA 139 01/16/2013 1117   K 3.9 07/10/2013 1003   K 4.3 01/16/2013 1117   CL 101 07/10/2013 1003   CL 100 01/16/2013 1117   CO2 31 07/10/2013 1003   CO2 29 01/16/2013 1117   BUN 15 07/10/2013 1003   BUN 14 01/16/2013 1117   CREATININE 1.1 07/10/2013 1003   CREATININE 1.00 01/16/2013 1117      Component Value Date/Time   CALCIUM 9.6 07/10/2013 1003   CALCIUM 9.3 01/16/2013 1117   ALKPHOS 55 07/10/2013 1003   ALKPHOS 57 01/16/2013 1117   AST 23 07/10/2013 1003   AST 15 01/16/2013 1117   ALT 21 07/10/2013 1003   ALT 15 01/16/2013 1117   BILITOT 0.80 07/10/2013 1003   BILITOT 0.4 01/16/2013 1117         Impression and Plan: Mr. [name] is 68 year old gentleman with  metastatic prostate cancer. He has minimal metastatic disease. He is asymptomatic.  We will continue to follow him along.  I don't see that we need to make any obvious changes in his medications.  He does have some hyperglycemia. We will see what his blood sugars are.  We will plan to get him back in 4 more weeks. He will get his injections then.   [name], MD 7/24/201511:00 AM

## 2013-08-08 LAB — COMPREHENSIVE METABOLIC PANEL
ALT: 17 U/L (ref 0–53)
AST: 16 U/L (ref 0–37)
Albumin: 4 g/dL (ref 3.5–5.2)
Alkaline Phosphatase: 45 U/L (ref 39–117)
BUN: 17 mg/dL (ref 6–23)
CO2: 27 mEq/L (ref 19–32)
Calcium: 9.8 mg/dL (ref 8.4–10.5)
Chloride: 103 mEq/L (ref 96–112)
Creatinine, Ser: 1.06 mg/dL (ref 0.50–1.35)
Glucose, Bld: 156 mg/dL — ABNORMAL HIGH (ref 70–99)
Potassium: 4.5 mEq/L (ref 3.5–5.3)
Sodium: 139 mEq/L (ref 135–145)
Total Bilirubin: 0.4 mg/dL (ref 0.2–1.2)
Total Protein: 6.5 g/dL (ref 6.0–8.3)

## 2013-08-08 LAB — PSA: PSA: 0.22 ng/mL (ref <=4.00)

## 2013-08-10 ENCOUNTER — Telehealth: Payer: Self-pay

## 2013-08-10 NOTE — Telephone Encounter (Signed)
Message left on generic VM to contact our office for lab results. dph

## 2013-08-10 NOTE — Telephone Encounter (Signed)
Message copied by Johny Drilling on Mon Aug 10, 2013 12:53 PM ------      Message from: [name]      Created: Fri Aug 07, 2013  6:19 PM       Please call and let him know that the blood sugar is still a little on the high side. It is 156. We saw him month ago it was 170. [name] ------

## 2013-08-11 ENCOUNTER — Telehealth: Payer: Self-pay | Admitting: *Deleted

## 2013-08-11 NOTE — Telephone Encounter (Addendum)
Message copied by Lenn Sink on Tue Aug 11, 2013 12:45 PM ------      Message from: Burney Gauze R      Created: Tue Aug 11, 2013  7:01 AM       Call - PSA is stable!!  It is only 0.22! pete ------Informed pt that PSA is stable at 0.22

## 2013-08-14 DIAGNOSIS — H40019 Open angle with borderline findings, low risk, unspecified eye: Secondary | ICD-10-CM | POA: Diagnosis not present

## 2013-08-18 ENCOUNTER — Other Ambulatory Visit: Payer: Self-pay | Admitting: Hematology & Oncology

## 2013-08-20 ENCOUNTER — Encounter: Payer: Self-pay | Admitting: Nurse Practitioner

## 2013-08-20 NOTE — Progress Notes (Signed)
Orders for PT faxed to [facility] [phone] and confirmation was received.

## 2013-08-26 DIAGNOSIS — M545 Low back pain, unspecified: Secondary | ICD-10-CM | POA: Diagnosis not present

## 2013-08-26 DIAGNOSIS — M6281 Muscle weakness (generalized): Secondary | ICD-10-CM | POA: Diagnosis not present

## 2013-08-28 DIAGNOSIS — M545 Low back pain, unspecified: Secondary | ICD-10-CM | POA: Diagnosis not present

## 2013-08-28 DIAGNOSIS — M6281 Muscle weakness (generalized): Secondary | ICD-10-CM | POA: Diagnosis not present

## 2013-09-01 DIAGNOSIS — M545 Low back pain, unspecified: Secondary | ICD-10-CM | POA: Diagnosis not present

## 2013-09-01 DIAGNOSIS — M6281 Muscle weakness (generalized): Secondary | ICD-10-CM | POA: Diagnosis not present

## 2013-09-03 DIAGNOSIS — M545 Low back pain, unspecified: Secondary | ICD-10-CM | POA: Diagnosis not present

## 2013-09-03 DIAGNOSIS — M6281 Muscle weakness (generalized): Secondary | ICD-10-CM | POA: Diagnosis not present

## 2013-09-04 ENCOUNTER — Encounter: Payer: Self-pay | Admitting: Hematology & Oncology

## 2013-09-04 ENCOUNTER — Ambulatory Visit: Payer: Medicare Other

## 2013-09-04 ENCOUNTER — Other Ambulatory Visit (HOSPITAL_BASED_OUTPATIENT_CLINIC_OR_DEPARTMENT_OTHER): Payer: Medicare Other | Admitting: Lab

## 2013-09-04 ENCOUNTER — Ambulatory Visit (HOSPITAL_BASED_OUTPATIENT_CLINIC_OR_DEPARTMENT_OTHER): Payer: Medicare Other | Admitting: Hematology & Oncology

## 2013-09-04 ENCOUNTER — Other Ambulatory Visit: Payer: Self-pay | Admitting: Hematology & Oncology

## 2013-09-04 VITALS — BP 139/74 | HR 81 | Temp 98.2°F | Resp 18 | Ht 68.0 in | Wt 174.0 lb

## 2013-09-04 DIAGNOSIS — R7309 Other abnormal glucose: Secondary | ICD-10-CM

## 2013-09-04 DIAGNOSIS — R739 Hyperglycemia, unspecified: Secondary | ICD-10-CM

## 2013-09-04 DIAGNOSIS — C61 Malignant neoplasm of prostate: Secondary | ICD-10-CM

## 2013-09-04 DIAGNOSIS — T50904A Poisoning by unspecified drugs, medicaments and biological substances, undetermined, initial encounter: Secondary | ICD-10-CM

## 2013-09-04 DIAGNOSIS — T50905A Adverse effect of unspecified drugs, medicaments and biological substances, initial encounter: Secondary | ICD-10-CM

## 2013-09-04 LAB — CBC WITH DIFFERENTIAL (CANCER CENTER ONLY)
BASO#: 0 10*3/uL (ref 0.0–0.2)
BASO%: 0.7 % (ref 0.0–2.0)
EOS%: 4 % (ref 0.0–7.0)
Eosinophils Absolute: 0.2 10*3/uL (ref 0.0–0.5)
HCT: 40.1 % (ref 38.7–49.9)
HGB: 13.3 g/dL (ref 13.0–17.1)
LYMPH#: 1.7 10*3/uL (ref 0.9–3.3)
LYMPH%: 28.7 % (ref 14.0–48.0)
MCH: 32.1 pg (ref 28.0–33.4)
MCHC: 33.2 g/dL (ref 32.0–35.9)
MCV: 97 fL (ref 82–98)
MONO#: 0.5 10*3/uL (ref 0.1–0.9)
MONO%: 7.9 % (ref 0.0–13.0)
NEUT#: 3.4 10*3/uL (ref 1.5–6.5)
NEUT%: 58.7 % (ref 40.0–80.0)
Platelets: 221 10*3/uL (ref 145–400)
RBC: 4.14 10*6/uL — ABNORMAL LOW (ref 4.20–5.70)
RDW: 12.8 % (ref 11.1–15.7)
WBC: 5.8 10*3/uL (ref 4.0–10.0)

## 2013-09-04 LAB — CMP (CANCER CENTER ONLY)
ALT(SGPT): 21 U/L (ref 10–47)
AST: 18 U/L (ref 11–38)
Albumin: 3.6 g/dL (ref 3.3–5.5)
Alkaline Phosphatase: 45 U/L (ref 26–84)
BUN, Bld: 17 mg/dL (ref 7–22)
CO2: 27 mEq/L (ref 18–33)
Calcium: 8.8 mg/dL (ref 8.0–10.3)
Chloride: 101 mEq/L (ref 98–108)
Creat: 0.9 mg/dl (ref 0.6–1.2)
Glucose, Bld: 184 mg/dL — ABNORMAL HIGH (ref 73–118)
Potassium: 3.6 mEq/L (ref 3.3–4.7)
Sodium: 141 mEq/L (ref 128–145)
Total Bilirubin: 0.7 mg/dl (ref 0.20–1.60)
Total Protein: 6.9 g/dL (ref 6.4–8.1)

## 2013-09-04 MED ORDER — GLIMEPIRIDE 1 MG PO TABS: 2.0000 mg | ORAL_TABLET | Freq: Every day | ORAL | Status: DC

## 2013-09-05 LAB — PSA: PSA: 0.19 ng/mL (ref <=4.00)

## 2013-09-05 NOTE — Progress Notes (Signed)
Hematology and Oncology Follow Up Visit  [name] [id number] 05-09-1945 68 y.o. 09/05/2013   Principle Diagnosis:   Metastatic castrate resistant prostate cancer    Current Therapy:    Zytiga 1000 mg by mouth daily  Xgeva 120 mg subcutaneous Q3 months  Lupron 22 mg IM every 3 months     Interim History:  [name] is back for followup. He's been doing quite well. He is still working.  Pain has not been much of a problem for him.  His appetite is good. He has had no problems with nausea or vomiting. His  blood sugars are still on the high side. I will increase his Amaryl to 2 mg a day.  He's had no problems bowels or bladder. He has had no problems with cough or shortness of breath. There've been no rashes.  His last PSA was 0.22. This has been holding pretty stable.  Overall, his performance status is ECOG 1    Medications: Current outpatient prescriptions:Calcium Carbonate-Vitamin D (CALCIUM + D PO), Take 500 mg by mouth 2 (two) times daily., Disp: , Rfl: ;  gabapentin (NEURONTIN) 100 MG capsule, twice a day, Disp: , Rfl: ;  glimepiride (AMARYL) 1 MG tablet, Take 2 tablets (2 mg total) by mouth daily with breakfast., Disp: 60 tablet, Rfl: 4;  Multiple Minerals-Vitamins (CALCIUM & VIT D3 BONE HEALTH PO), Take by mouth every morning. , Disp: , Rfl:  omeprazole (PRILOSEC) 20 MG capsule, Take 20 mg by mouth daily. , Disp: , Rfl: ;  OVER THE COUNTER MEDICATION, every morning. Juice Plus, Disp: , Rfl: ;  polyethylene glycol powder (MIRALAX) powder, Take 17 g by mouth daily., Disp: , Rfl: ;  solifenacin (VESICARE) 5 MG tablet, Take 5 mg by mouth 3 (three) times a week., Disp: , Rfl:  traZODone (DESYREL) 50 MG tablet, TAKE 1 & 1/2 TABLETS BY MOUTH (75 MG TOTAL) BY MOUTH AT BEDTIME., Disp: 45 tablet, Rfl: 4;  predniSONE (DELTASONE) 5 MG tablet, TAKE 1 TABLET BY MOUTH ONCE DAILY, Disp: 60 tablet, Rfl: 0;  ZYTIGA 250 MG tablet, TAKE 4 TABLETS BY MOUTH DAILY. TAKE ON AN EMPTY STOMACH 1  HOUR BEFORE OR 2 HOURS AFTER A MEAL, Disp: 120 tablet, Rfl: 0  Allergies: No Known Allergies  Past Medical History, Surgical history, Social history, and Family History were reviewed and updated.  Review of Systems: As above  Physical Exam:  height is 5\' 8"  (1.727 m) and weight is 174 lb (78.926 kg). His oral temperature is 98.2 F (36.8 C). His blood pressure is 139/74 and his pulse is 81. His respiration is 18.   Well-developed and well-nourished white male in. Head and neck exam shows no ocular or oral lesions. The are no palpable cervical or supraclavicular lymph nodes. Lungs are clear. Cardiac exam regular rate and rhythm with no murmurs rubs or bruits. Abdomen is soft. She has good bowel sounds. There is no fluid wave. There is no palpable liver or spleen tip. Back exam shows no tenderness over the spine ribs or hips. Extremities shows no clubbing, cyanosis or edema. Skin exam no rashes. Neurological exam is nonfocal.  Lab Results  Component Value Date   WBC 5.8 09/04/2013   HGB 13.3 09/04/2013   HCT 40.1 09/04/2013   MCV 97 09/04/2013   PLT 221 09/04/2013     Chemistry      Component Value Date/Time   NA 141 09/04/2013 0838   NA 139 08/07/2013 0940   K 3.6 09/04/2013  0838   K 4.5 08/07/2013 0940   CL 101 09/04/2013 0838   CL 103 08/07/2013 0940   CO2 27 09/04/2013 0838   CO2 27 08/07/2013 0940   BUN 17 09/04/2013 0838   BUN 17 08/07/2013 0940   CREATININE 0.9 09/04/2013 0838   CREATININE 1.06 08/07/2013 0940      Component Value Date/Time   CALCIUM 8.8 09/04/2013 0838   CALCIUM 9.8 08/07/2013 0940   ALKPHOS 45 09/04/2013 0838   ALKPHOS 45 08/07/2013 0940   AST 18 09/04/2013 0838   AST 16 08/07/2013 0940   ALT 21 09/04/2013 0838   ALT 17 08/07/2013 0940   BILITOT 0.70 09/04/2013 0838   BILITOT 0.4 08/07/2013 0940     PSA is 0.19   Impression and Plan: [name] is 68 year old male with metastatic prostate cancer. This is castrate resistant. He is doing very well with Zytiga. His  PSA is holding steady. He is a good quality of life.  We will continue to follow him along.  We will get him back in one month. When we see her back, he will get his Lupron and Xgeva.   [name], MD 8/22/20158:34 AM

## 2013-09-07 ENCOUNTER — Telehealth: Payer: Self-pay | Admitting: Hematology & Oncology

## 2013-09-07 NOTE — Telephone Encounter (Signed)
S[employer] approved ZYTIGA TABLET 250MG   Member: [id number]  Dates: 06/06/2013 - 09/03/2016     P: [phone]    COPY SCANNED

## 2013-09-08 ENCOUNTER — Telehealth: Payer: Self-pay | Admitting: *Deleted

## 2013-09-08 NOTE — Telephone Encounter (Signed)
Message copied by Rico Ala on Tue Sep 08, 2013 10:13 AM ------      Message from: [name]      Created: Sun Sep 06, 2013  1:56 PM       Call - PSA is stable!!  It is 0.19 and was 0.22!! pete ------

## 2013-09-09 DIAGNOSIS — M545 Low back pain, unspecified: Secondary | ICD-10-CM | POA: Diagnosis not present

## 2013-09-09 DIAGNOSIS — M6281 Muscle weakness (generalized): Secondary | ICD-10-CM | POA: Diagnosis not present

## 2013-09-11 DIAGNOSIS — M6281 Muscle weakness (generalized): Secondary | ICD-10-CM | POA: Diagnosis not present

## 2013-09-11 DIAGNOSIS — M545 Low back pain, unspecified: Secondary | ICD-10-CM | POA: Diagnosis not present

## 2013-09-18 DIAGNOSIS — M6281 Muscle weakness (generalized): Secondary | ICD-10-CM | POA: Diagnosis not present

## 2013-09-18 DIAGNOSIS — M545 Low back pain, unspecified: Secondary | ICD-10-CM | POA: Diagnosis not present

## 2013-10-02 ENCOUNTER — Ambulatory Visit (HOSPITAL_BASED_OUTPATIENT_CLINIC_OR_DEPARTMENT_OTHER): Payer: Medicare Other | Admitting: Hematology & Oncology

## 2013-10-02 ENCOUNTER — Encounter: Payer: Self-pay | Admitting: Hematology & Oncology

## 2013-10-02 ENCOUNTER — Ambulatory Visit (HOSPITAL_BASED_OUTPATIENT_CLINIC_OR_DEPARTMENT_OTHER): Payer: Medicare Other

## 2013-10-02 ENCOUNTER — Other Ambulatory Visit (HOSPITAL_BASED_OUTPATIENT_CLINIC_OR_DEPARTMENT_OTHER): Payer: Medicare Other | Admitting: Lab

## 2013-10-02 ENCOUNTER — Other Ambulatory Visit: Payer: Self-pay | Admitting: Hematology & Oncology

## 2013-10-02 VITALS — BP 131/74 | HR 78 | Temp 97.9°F | Resp 18 | Ht 68.0 in | Wt 172.0 lb

## 2013-10-02 DIAGNOSIS — C7951 Secondary malignant neoplasm of bone: Secondary | ICD-10-CM

## 2013-10-02 DIAGNOSIS — C61 Malignant neoplasm of prostate: Secondary | ICD-10-CM

## 2013-10-02 DIAGNOSIS — Z5111 Encounter for antineoplastic chemotherapy: Secondary | ICD-10-CM

## 2013-10-02 DIAGNOSIS — C7952 Secondary malignant neoplasm of bone marrow: Secondary | ICD-10-CM

## 2013-10-02 LAB — CBC WITH DIFFERENTIAL (CANCER CENTER ONLY)
BASO#: 0 10*3/uL (ref 0.0–0.2)
BASO%: 0.7 % (ref 0.0–2.0)
EOS%: 5.6 % (ref 0.0–7.0)
Eosinophils Absolute: 0.3 10*3/uL (ref 0.0–0.5)
HCT: 41.5 % (ref 38.7–49.9)
HGB: 13.8 g/dL (ref 13.0–17.1)
LYMPH#: 1.5 10*3/uL (ref 0.9–3.3)
LYMPH%: 28.3 % (ref 14.0–48.0)
MCH: 32.1 pg (ref 28.0–33.4)
MCHC: 33.3 g/dL (ref 32.0–35.9)
MCV: 97 fL (ref 82–98)
MONO#: 0.5 10*3/uL (ref 0.1–0.9)
MONO%: 8.9 % (ref 0.0–13.0)
NEUT#: 3 10*3/uL (ref 1.5–6.5)
NEUT%: 56.5 % (ref 40.0–80.0)
Platelets: 221 10*3/uL (ref 145–400)
RBC: 4.3 10*6/uL (ref 4.20–5.70)
RDW: 12.9 % (ref 11.1–15.7)
WBC: 5.4 10*3/uL (ref 4.0–10.0)

## 2013-10-02 LAB — CMP (CANCER CENTER ONLY)
ALT(SGPT): 15 U/L (ref 10–47)
AST: 17 U/L (ref 11–38)
Albumin: 3.5 g/dL (ref 3.3–5.5)
Alkaline Phosphatase: 54 U/L (ref 26–84)
BUN, Bld: 14 mg/dL (ref 7–22)
CO2: 27 mEq/L (ref 18–33)
Calcium: 9.1 mg/dL (ref 8.0–10.3)
Chloride: 102 mEq/L (ref 98–108)
Creat: 0.9 mg/dl (ref 0.6–1.2)
Glucose, Bld: 135 mg/dL — ABNORMAL HIGH (ref 73–118)
Potassium: 3.6 mEq/L (ref 3.3–4.7)
Sodium: 141 mEq/L (ref 128–145)
Total Bilirubin: 0.6 mg/dl (ref 0.20–1.60)
Total Protein: 6.9 g/dL (ref 6.4–8.1)

## 2013-10-02 MED ORDER — DENOSUMAB 120 MG/1.7ML ~~LOC~~ SOLN
120.0000 mg | Freq: Once | SUBCUTANEOUS | Status: AC
  Administered 2013-10-02: 120 mg via SUBCUTANEOUS
  Filled 2013-10-02: qty 1.7

## 2013-10-02 MED ORDER — LEUPROLIDE ACETATE (3 MONTH) 22.5 MG IM KIT
22.5000 mg | PACK | Freq: Once | INTRAMUSCULAR | Status: AC
  Administered 2013-10-02: 22.5 mg via INTRAMUSCULAR
  Filled 2013-10-02: qty 22.5

## 2013-10-02 NOTE — Progress Notes (Signed)
Hematology and Oncology Follow Up Visit  [name] [id number] 1945/08/05 68 y.o. 10/02/2013   Principle Diagnosis:   Metastatic castrate resistant prostate cancer  Current Therapy:    Zytiga 1000 mg by mouth daily  Xgeva 120 mg subcutaneous Q3 months  Lupron 22 mg IM every 3 months     Interim History:  [name] is back for followup. He's doing quite well. He's had no problems his last saw him. His lipid been no bone pain. He's had no cough. He's had no shortness of breath.  His PSA has been holding pretty steady. His last PSA was 0.19.  He's had no change in bowel or bladder habits. Has been no bleeding.  Blood sugars have doing a little bit better. He increased his dose of glipizide.  He's had no headache.  Medications: Current outpatient prescriptions:Calcium Carbonate-Vitamin D (CALCIUM + D PO), Take 500 mg by mouth 2 (two) times daily., Disp: , Rfl: ;  gabapentin (NEURONTIN) 100 MG capsule, twice a day, Disp: , Rfl: ;  glimepiride (AMARYL) 1 MG tablet, Take 2 tablets (2 mg total) by mouth daily with breakfast., Disp: 60 tablet, Rfl: 4;  Multiple Minerals-Vitamins (CALCIUM & VIT D3 BONE HEALTH PO), Take by mouth every morning. , Disp: , Rfl:  omeprazole (PRILOSEC) 20 MG capsule, Take 20 mg by mouth daily. , Disp: , Rfl: ;  OVER THE COUNTER MEDICATION, every morning. Juice Plus, Disp: , Rfl: ;  polyethylene glycol powder (MIRALAX) powder, Take 17 g by mouth daily., Disp: , Rfl: ;  predniSONE (DELTASONE) 5 MG tablet, TAKE 1 TABLET BY MOUTH ONCE DAILY, Disp: 60 tablet, Rfl: 0;  solifenacin (VESICARE) 5 MG tablet, Take 5 mg by mouth 3 (three) times a week., Disp: , Rfl:  traZODone (DESYREL) 50 MG tablet, TAKE 1 & 1/2 TABLETS BY MOUTH (75 MG TOTAL) BY MOUTH AT BEDTIME., Disp: 45 tablet, Rfl: 4;  ZYTIGA 250 MG tablet, TAKE 4 TABLETS BY MOUTH DAILY. TAKE ON AN EMPTY STOMACH 1 HOUR BEFORE OR 2 HOURS AFTER A MEAL, Disp: 120 tablet, Rfl: 0  Allergies: No Known Allergies  Past  Medical History, Surgical history, Social history, and Family History were reviewed and updated.  Review of Systems: As above  Physical Exam:  height is 5\' 8"  (1.727 m) and weight is 172 lb (78.019 kg). His oral temperature is 97.9 F (36.6 C). His blood pressure is 131/74 and his pulse is 78. His respiration is 18.   Well-developed and well-nourished white male in. Head and neck exam shows no ocular or oral lesions. The are no palpable cervical or supraclavicular lymph nodes. Lungs are clear. Cardiac exam regular rate and rhythm with no murmurs rubs or bruits. Abdomen is soft. She has good bowel sounds. There is no fluid wave. There is no palpable liver or spleen tip. Back exam shows no tenderness over the spine ribs or hips. Extremities shows no clubbing, cyanosis or edema. Skin exam no rashes. Neurological exam is nonfocal.   Lab Results  Component Value Date   WBC 5.4 10/02/2013   HGB 13.8 10/02/2013   HCT 41.5 10/02/2013   MCV 97 10/02/2013   PLT 221 10/02/2013     Chemistry      Component Value Date/Time   NA 141 10/02/2013 0943   NA 139 08/07/2013 0940   K 3.6 10/02/2013 0943   K 4.5 08/07/2013 0940   CL 102 10/02/2013 0943   CL 103 08/07/2013 0940   CO2 27 10/02/2013 0943  CO2 27 08/07/2013 0940   BUN 14 10/02/2013 0943   BUN 17 08/07/2013 0940   CREATININE 0.9 10/02/2013 0943   CREATININE 1.06 08/07/2013 0940      Component Value Date/Time   CALCIUM 9.1 10/02/2013 0943   CALCIUM 9.8 08/07/2013 0940   ALKPHOS 54 10/02/2013 0943   ALKPHOS 45 08/07/2013 0940   AST 17 10/02/2013 0943   AST 16 08/07/2013 0940   ALT 15 10/02/2013 0943   ALT 17 08/07/2013 0940   BILITOT 0.60 10/02/2013 0943   BILITOT 0.4 08/07/2013 0940         Impression and Plan: [name] is 68 year old gentleman with metastatic prostate cancer. He is doing quite well from my point of view. He is a well with the Zytiga.  We will see what his PSA is.  We will continue him on the Xgeva. He may get his Lupron  today.  Plan to get him back to see [place] in another month or so.   [name], MD 9/18/201511:24 AM

## 2013-10-02 NOTE — Patient Instructions (Signed)
Denosumab injection What is this medicine? DENOSUMAB (den oh sue mab) slows bone breakdown. Prolia is used to treat osteoporosis in women after menopause and in men. Xgeva is used to prevent bone fractures and other bone problems caused by cancer bone metastases. Xgeva is also used to treat giant cell tumor of the bone. This medicine may be used for other purposes; ask your health care provider or pharmacist if you have questions. COMMON BRAND NAME(S): Prolia, XGEVA What should I tell my health care provider before I take this medicine? They need to know if you have any of these conditions: -dental disease -eczema -infection or history of infections -kidney disease or on dialysis -low blood calcium or vitamin D -malabsorption syndrome -scheduled to have surgery or tooth extraction -taking medicine that contains denosumab -thyroid or parathyroid disease -an unusual reaction to denosumab, other medicines, foods, dyes, or preservatives -pregnant or trying to get pregnant -breast-feeding How should I use this medicine? This medicine is for injection under the skin. It is given by a health care professional in a hospital or clinic setting. If you are getting Prolia, a special MedGuide will be given to you by the pharmacist with each prescription and refill. Be sure to read this information carefully each time. For Prolia, talk to your pediatrician regarding the use of this medicine in children. Special care may be needed. For Xgeva, talk to your pediatrician regarding the use of this medicine in children. While this drug may be prescribed for children as young as 13 years for selected conditions, precautions do apply. Overdosage: If you think you've taken too much of this medicine contact a poison control center or emergency room at once. Overdosage: If you think you have taken too much of this medicine contact a poison control center or emergency room at once. NOTE: This medicine is only for  you. Do not share this medicine with others. What if I miss a dose? It is important not to miss your dose. Call your doctor or health care professional if you are unable to keep an appointment. What may interact with this medicine? Do not take this medicine with any of the following medications: -other medicines containing denosumab This medicine may also interact with the following medications: -medicines that suppress the immune system -medicines that treat cancer -steroid medicines like prednisone or cortisone This list may not describe all possible interactions. Give your health care provider a list of all the medicines, herbs, non-prescription drugs, or dietary supplements you use. Also tell them if you smoke, drink alcohol, or use illegal drugs. Some items may interact with your medicine. What should I watch for while using this medicine? Visit your doctor or health care professional for regular checks on your progress. Your doctor or health care professional may order blood tests and other tests to see how you are doing. Call your doctor or health care professional if you get a cold or other infection while receiving this medicine. Do not treat yourself. This medicine may decrease your body's ability to fight infection. You should make sure you get enough calcium and vitamin D while you are taking this medicine, unless your doctor tells you not to. Discuss the foods you eat and the vitamins you take with your health care professional. See your dentist regularly. Brush and floss your teeth as directed. Before you have any dental work done, tell your dentist you are receiving this medicine. Do not become pregnant while taking this medicine or for 5 months after stopping   it. Women should inform their doctor if they wish to become pregnant or think they might be pregnant. There is a potential for serious side effects to an unborn child. Talk to your health care professional or pharmacist for more  information. What side effects may I notice from receiving this medicine? Side effects that you should report to your doctor or health care professional as soon as possible: -allergic reactions like skin rash, itching or hives, swelling of the face, lips, or tongue -breathing problems -chest pain -fast, irregular heartbeat -feeling faint or lightheaded, falls -fever, chills, or any other sign of infection -muscle spasms, tightening, or twitches -numbness or tingling -skin blisters or bumps, or is dry, peels, or red -slow healing or unexplained pain in the mouth or jaw -unusual bleeding or bruising Side effects that usually do not require medical attention (Report these to your doctor or health care professional if they continue or are bothersome.): -muscle pain -stomach upset, gas This list may not describe all possible side effects. Call your doctor for medical advice about side effects. You may report side effects to FDA at 1-800-FDA-1088. Where should I keep my medicine? This medicine is only given in a clinic, doctor's office, or other health care setting and will not be stored at home. NOTE: This sheet is a summary. It may not cover all possible information. If you have questions about this medicine, talk to your doctor, pharmacist, or health care provider.  2015, Elsevier/Gold Standard. (2011-07-02 12:37:47) Leuprolide depot injection or implant What is this medicine? LEUPROLIDE (loo PROE lide) is a man-made protein that acts like a natural hormone in the body. It decreases testosterone in men and decreases estrogen in women. In men, this medicine is used to treat advanced prostate cancer. In women, some forms of this medicine may be used to treat endometriosis, uterine fibroids, or other male hormone-related problems. This medicine may be used for other purposes; ask your health care provider or pharmacist if you have questions. COMMON BRAND NAME(S): Eligard, Lupron Depot, Lupron  Depot-Ped, Viadur What should I tell my health care provider before I take this medicine? They need to know if you have any of these conditions: -diabetes -heart disease or previous heart attack -high blood pressure -high cholesterol -osteoporosis -pain or difficulty passing urine -spinal cord metastasis -stroke -tobacco smoker -unusual vaginal bleeding (women) -an unusual or allergic reaction to leuprolide, benzyl alcohol, other medicines, foods, dyes, or preservatives -pregnant or trying to get pregnant -breast-feeding How should I use this medicine? This medicine is for injection into a muscle or for implant or injection under the skin. It is given by a health care professional in a hospital or clinic setting. The specific product will determine how it will be given to you. Make sure you understand which product you receive and how often you will receive it. Talk to your pediatrician regarding the use of this medicine in children. Special care may be needed. Overdosage: If you think you have taken too much of this medicine contact a poison control center or emergency room at once. NOTE: This medicine is only for you. Do not share this medicine with others. What if I miss a dose? It is important not to miss a dose. Call your doctor or health care professional if you are unable to keep an appointment. Depot injections: Depot injections are given either once-monthly, every 12 weeks, every 16 weeks, or every 24 weeks depending on the product you are prescribed. The product you are prescribed  will be based on if you are male or male, and your condition. Make sure you understand your product and dosing. Implant dosing: The implant is removed and replaced once a year. The implant is only used in males. What may interact with this medicine? Do not take this medicine with any of the following medications: -chasteberry This medicine may also interact with the following medications: -herbal or  dietary supplements, like black cohosh or DHEA -male hormones, like estrogens or progestins and birth control pills, patches, rings, or injections -male hormones, like testosterone This list may not describe all possible interactions. Give your health care provider a list of all the medicines, herbs, non-prescription drugs, or dietary supplements you use. Also tell them if you smoke, drink alcohol, or use illegal drugs. Some items may interact with your medicine. What should I watch for while using this medicine? Visit your doctor or health care professional for regular checks on your progress. During the first weeks of treatment, your symptoms may get worse, but then will improve as you continue your treatment. You may get hot flashes, increased bone pain, increased difficulty passing urine, or an aggravation of nerve symptoms. Discuss these effects with your doctor or health care professional, some of them may improve with continued use of this medicine. Male patients may experience a menstrual cycle or spotting during the first months of therapy with this medicine. If this continues, contact your doctor or health care professional. What side effects may I notice from receiving this medicine? Side effects that you should report to your doctor or health care professional as soon as possible: -allergic reactions like skin rash, itching or hives, swelling of the face, lips, or tongue -breathing problems -chest pain -depression or memory disorders -pain in your legs or groin -pain at site where injected or implanted -severe headache -swelling of the feet and legs -visual changes -vomiting Side effects that usually do not require medical attention (report to your doctor or health care professional if they continue or are bothersome): -breast swelling or tenderness -decrease in sex drive or performance -diarrhea -hot flashes -loss of appetite -muscle, joint, or bone pains -nausea -redness  or irritation at site where injected or implanted -skin problems or acne This list may not describe all possible side effects. Call your doctor for medical advice about side effects. You may report side effects to FDA at 1-800-FDA-1088. Where should I keep my medicine? This drug is given in a hospital or clinic and will not be stored at home. NOTE: This sheet is a summary. It may not cover all possible information. If you have questions about this medicine, talk to your doctor, pharmacist, or health care provider.  2015, Elsevier/Gold Standard. (2009-07-05 14:41:21)

## 2013-10-03 LAB — TESTOSTERONE: Testosterone: <10 ng/dL — ABNORMAL LOW (ref 300–890)

## 2013-10-03 LAB — PSA: PSA: 0.21 ng/mL (ref <=4.00)

## 2013-10-06 ENCOUNTER — Telehealth: Payer: Self-pay | Admitting: *Deleted

## 2013-10-06 NOTE — Telephone Encounter (Addendum)
Message copied by Lenn Sink on Tue Oct 06, 2013  8:55 AM ------      Message from: [name]      Created: Sun Oct 04, 2013  3:24 PM       Call - PSA is still pretty stable -- it is 0.21.  It was 0.19.  This is great!!!  Laurey Arrow ------Informed pt that PSA is still pretty stable -- it is 0.21. It was 0.19. This is great

## 2013-10-08 ENCOUNTER — Encounter: Payer: Self-pay | Admitting: Hematology & Oncology

## 2013-10-12 ENCOUNTER — Other Ambulatory Visit: Payer: Self-pay | Admitting: Hematology & Oncology

## 2013-11-10 ENCOUNTER — Other Ambulatory Visit: Payer: Self-pay | Admitting: Hematology & Oncology

## 2013-11-13 ENCOUNTER — Ambulatory Visit (HOSPITAL_BASED_OUTPATIENT_CLINIC_OR_DEPARTMENT_OTHER): Payer: Medicare Other | Admitting: Hematology & Oncology

## 2013-11-13 ENCOUNTER — Encounter: Payer: Self-pay | Admitting: Hematology & Oncology

## 2013-11-13 ENCOUNTER — Ambulatory Visit: Payer: Medicare Other

## 2013-11-13 ENCOUNTER — Other Ambulatory Visit (HOSPITAL_BASED_OUTPATIENT_CLINIC_OR_DEPARTMENT_OTHER): Payer: Medicare Other | Admitting: Lab

## 2013-11-13 VITALS — BP 133/77 | HR 70 | Temp 98.2°F | Resp 18 | Ht 68.0 in | Wt 171.0 lb

## 2013-11-13 DIAGNOSIS — C7951 Secondary malignant neoplasm of bone: Secondary | ICD-10-CM

## 2013-11-13 DIAGNOSIS — C61 Malignant neoplasm of prostate: Secondary | ICD-10-CM | POA: Diagnosis not present

## 2013-11-13 LAB — CMP (CANCER CENTER ONLY)
ALT(SGPT): 15 U/L (ref 10–47)
AST: 17 U/L (ref 11–38)
Albumin: 3.6 g/dL (ref 3.3–5.5)
Alkaline Phosphatase: 55 U/L (ref 26–84)
BUN, Bld: 14 mg/dL (ref 7–22)
CO2: 27 mEq/L (ref 18–33)
Calcium: 8.8 mg/dL (ref 8.0–10.3)
Chloride: 103 mEq/L (ref 98–108)
Creat: 1.1 mg/dl (ref 0.6–1.2)
Glucose, Bld: 97 mg/dL (ref 73–118)
Potassium: 3.6 mEq/L (ref 3.3–4.7)
Sodium: 142 mEq/L (ref 128–145)
Total Bilirubin: 0.6 mg/dl (ref 0.20–1.60)
Total Protein: 6.8 g/dL (ref 6.4–8.1)

## 2013-11-13 LAB — CBC WITH DIFFERENTIAL (CANCER CENTER ONLY)
BASO#: 0.1 10*3/uL (ref 0.0–0.2)
BASO%: 0.7 % (ref 0.0–2.0)
EOS%: 4.5 % (ref 0.0–7.0)
Eosinophils Absolute: 0.3 10*3/uL (ref 0.0–0.5)
HCT: 41.5 % (ref 38.7–49.9)
HGB: 13.6 g/dL (ref 13.0–17.1)
LYMPH#: 2.1 10*3/uL (ref 0.9–3.3)
LYMPH%: 28 % (ref 14.0–48.0)
MCH: 31.5 pg (ref 28.0–33.4)
MCHC: 32.8 g/dL (ref 32.0–35.9)
MCV: 96 fL (ref 82–98)
MONO#: 0.7 10*3/uL (ref 0.1–0.9)
MONO%: 8.9 % (ref 0.0–13.0)
NEUT#: 4.4 10*3/uL (ref 1.5–6.5)
NEUT%: 57.9 % (ref 40.0–80.0)
Platelets: 228 10*3/uL (ref 145–400)
RBC: 4.32 10*6/uL (ref 4.20–5.70)
RDW: 12.9 % (ref 11.1–15.7)
WBC: 7.6 10*3/uL (ref 4.0–10.0)

## 2013-11-14 LAB — PSA: PSA: 0.18 ng/mL (ref <=4.00)

## 2013-11-14 NOTE — Progress Notes (Signed)
Hematology and Oncology Follow Up Visit  [name] [id number] [birth year] 68 y.o. 11/14/2013   Principle Diagnosis:   Metastatic castrate resistant prostate cancer  Current Therapy:    Zytiga 1000 mg by mouth daily  Xgeva 120 mg subcutaneous Q3 months  Lupron 22 mg IM every 3 months     Interim History:  [name] is back for followup. He's doing quite well. He's had no problems his last saw him. He has not complained of any bone pain. He's had no cough. He's had no shortness of breath.  His PSA has been holding pretty steady. His last PSA was 0.21. This is stable.  He's had no change in bowel or bladder habits. Has been no bleeding.  Blood sugars have doing a little bit better. He increased his dose of glipizide.  He's had no headache. He has had no rashes.  Medications: Current outpatient prescriptions:Calcium Carbonate-Vitamin D (CALCIUM + D PO), Take 500 mg by mouth 2 (two) times daily., Disp: , Rfl: ;  gabapentin (NEURONTIN) 100 MG capsule, twice a day, Disp: , Rfl: ;  glimepiride (AMARYL) 1 MG tablet, Take 2 tablets (2 mg total) by mouth daily with breakfast., Disp: 60 tablet, Rfl: 4;  Multiple Minerals-Vitamins (CALCIUM & VIT D3 BONE HEALTH PO), Take by mouth every morning. , Disp: , Rfl:  omeprazole (PRILOSEC) 20 MG capsule, Take 20 mg by mouth daily. , Disp: , Rfl: ;  OVER THE COUNTER MEDICATION, every morning. Juice Plus, Disp: , Rfl: ;  polyethylene glycol powder (MIRALAX) powder, Take 17 g by mouth daily., Disp: , Rfl: ;  predniSONE (DELTASONE) 5 MG tablet, TAKE 1 TABLET BY MOUTH ONCE DAILY, Disp: 60 tablet, Rfl: 0;  solifenacin (VESICARE) 5 MG tablet, 3 times a week, Disp: , Rfl:  traZODone (DESYREL) 50 MG tablet, bedtime as needed, Disp: , Rfl: ;  ZYTIGA 250 MG tablet, TAKE 4 TABLETS BY MOUTH DAILY. TAKE ON AN EMPTY STOMACH 1 HOUR BEFORE OR 2 HOURS AFTER A MEAL, Disp: 120 tablet, Rfl: 0  Allergies: No Known Allergies  Past Medical History, Surgical history,  Social history, and Family History were reviewed and updated.  Review of Systems: As above  Physical Exam:  height is 5\' 8"  (1.727 m) and weight is 171 lb (77.565 kg). His oral temperature is 98.2 F (36.8 C). His blood pressure is 133/77 and his pulse is 70. His respiration is 18.   Well-developed and well-nourished white male in. Head and neck exam shows no ocular or oral lesions. The are no palpable cervical or supraclavicular lymph nodes. Lungs are clear. Cardiac exam regular rate and rhythm with no murmurs rubs or bruits. Abdomen is soft. She has good bowel sounds. There is no fluid wave. There is no palpable liver or spleen tip. Back exam shows no tenderness over the spine ribs or hips. Extremities shows no clubbing, cyanosis or edema. Skin exam no rashes. Neurological exam is nonfocal.   Lab Results  Component Value Date   WBC 7.6 11/13/2013   HGB 13.6 11/13/2013   HCT 41.5 11/13/2013   MCV 96 11/13/2013   PLT 228 11/13/2013     Chemistry      Component Value Date/Time   NA 142 11/13/2013 0911   NA 139 08/07/2013 0940   K 3.6 11/13/2013 0911   K 4.5 08/07/2013 0940   CL 103 11/13/2013 0911   CL 103 08/07/2013 0940   CO2 27 11/13/2013 0911   CO2 27 08/07/2013 0940  BUN 14 11/13/2013 0911   BUN 17 08/07/2013 0940   CREATININE 1.1 11/13/2013 0911   CREATININE 1.06 08/07/2013 0940      Component Value Date/Time   CALCIUM 8.8 11/13/2013 0911   CALCIUM 9.8 08/07/2013 0940   ALKPHOS 55 11/13/2013 0911   ALKPHOS 45 08/07/2013 0940   AST 17 11/13/2013 0911   AST 16 08/07/2013 0940   ALT 15 11/13/2013 0911   ALT 17 08/07/2013 0940   BILITOT 0.60 11/13/2013 0911   BILITOT 0.4 08/07/2013 0940         Impression and Plan: [name] is 68 year old gentleman with metastatic prostate cancer. He is doing quite well from my point of view. He is doing very well with the Zytiga. He has had no side effects. It is working.  His PSA is 0.18. Again, this is stable.  We will continue  him on the Xgeva. He does not need any injections today.  Plan to get him back to see [place] in another 6 weeks. He will get his injections at that time.   [name], MD 10/31/20159:36 AM

## 2013-11-16 ENCOUNTER — Telehealth: Payer: Self-pay | Admitting: *Deleted

## 2013-11-16 NOTE — Telephone Encounter (Signed)
-----   Message from [name], MD sent at 11/15/2013  9:14 AM EST ----- Please call and tell him that the PSA is nice and stable at 0.18. This is fantastic.[name]

## 2013-12-17 ENCOUNTER — Ambulatory Visit (HOSPITAL_BASED_OUTPATIENT_CLINIC_OR_DEPARTMENT_OTHER): Payer: Medicare Other

## 2013-12-17 ENCOUNTER — Other Ambulatory Visit (HOSPITAL_BASED_OUTPATIENT_CLINIC_OR_DEPARTMENT_OTHER): Payer: Medicare Other | Admitting: Lab

## 2013-12-17 ENCOUNTER — Other Ambulatory Visit: Payer: Self-pay | Admitting: Hematology & Oncology

## 2013-12-17 ENCOUNTER — Ambulatory Visit (HOSPITAL_BASED_OUTPATIENT_CLINIC_OR_DEPARTMENT_OTHER): Payer: Medicare Other | Admitting: Hematology & Oncology

## 2013-12-17 ENCOUNTER — Encounter: Payer: Self-pay | Admitting: Hematology & Oncology

## 2013-12-17 VITALS — BP 143/75 | HR 96 | Temp 98.1°F | Resp 18 | Ht 68.0 in | Wt 172.0 lb

## 2013-12-17 DIAGNOSIS — R739 Hyperglycemia, unspecified: Secondary | ICD-10-CM

## 2013-12-17 DIAGNOSIS — C61 Malignant neoplasm of prostate: Secondary | ICD-10-CM | POA: Diagnosis not present

## 2013-12-17 DIAGNOSIS — C7951 Secondary malignant neoplasm of bone: Secondary | ICD-10-CM | POA: Diagnosis not present

## 2013-12-17 DIAGNOSIS — Z5111 Encounter for antineoplastic chemotherapy: Secondary | ICD-10-CM

## 2013-12-17 LAB — CMP (CANCER CENTER ONLY)
ALT(SGPT): 22 U/L (ref 10–47)
AST: 18 U/L (ref 11–38)
Albumin: 3.7 g/dL (ref 3.3–5.5)
Alkaline Phosphatase: 66 U/L (ref 26–84)
BUN, Bld: 12 mg/dL (ref 7–22)
CO2: 27 mEq/L (ref 18–33)
Calcium: 9.2 mg/dL (ref 8.0–10.3)
Chloride: 99 mEq/L (ref 98–108)
Creat: 1 mg/dl (ref 0.6–1.2)
Glucose, Bld: 239 mg/dL — ABNORMAL HIGH (ref 73–118)
Potassium: 4.3 mEq/L (ref 3.3–4.7)
Sodium: 137 mEq/L (ref 128–145)
Total Bilirubin: 0.7 mg/dl (ref 0.20–1.60)
Total Protein: 7.3 g/dL (ref 6.4–8.1)

## 2013-12-17 LAB — CBC WITH DIFFERENTIAL (CANCER CENTER ONLY)
BASO#: 0.1 10*3/uL (ref 0.0–0.2)
BASO%: 1 % (ref 0.0–2.0)
EOS%: 0.5 % (ref 0.0–7.0)
Eosinophils Absolute: 0.1 10*3/uL (ref 0.0–0.5)
HCT: 42.5 % (ref 38.7–49.9)
HGB: 14 g/dL (ref 13.0–17.1)
LYMPH#: 1.5 10*3/uL (ref 0.9–3.3)
LYMPH%: 15.1 % (ref 14.0–48.0)
MCH: 31.7 pg (ref 28.0–33.4)
MCHC: 32.9 g/dL (ref 32.0–35.9)
MCV: 96 fL (ref 82–98)
MONO#: 0.4 10*3/uL (ref 0.1–0.9)
MONO%: 4.4 % (ref 0.0–13.0)
NEUT#: 7.9 10*3/uL — ABNORMAL HIGH (ref 1.5–6.5)
NEUT%: 79 % (ref 40.0–80.0)
Platelets: 249 10*3/uL (ref 145–400)
RBC: 4.42 10*6/uL (ref 4.20–5.70)
RDW: 12.8 % (ref 11.1–15.7)
WBC: 9.9 10*3/uL (ref 4.0–10.0)

## 2013-12-17 MED ORDER — LEUPROLIDE ACETATE (3 MONTH) 22.5 MG IM KIT
22.5000 mg | PACK | Freq: Once | INTRAMUSCULAR | Status: AC
  Administered 2013-12-17: 22.5 mg via INTRAMUSCULAR
  Filled 2013-12-17: qty 22.5

## 2013-12-17 MED ORDER — METFORMIN HCL ER 500 MG PO TB24: 500.0000 mg | ORAL_TABLET | Freq: Every day | ORAL | Status: DC

## 2013-12-17 MED ORDER — DENOSUMAB 120 MG/1.7ML ~~LOC~~ SOLN
120.0000 mg | Freq: Once | SUBCUTANEOUS | Status: AC
  Administered 2013-12-17: 120 mg via SUBCUTANEOUS
  Filled 2013-12-17: qty 1.7

## 2013-12-17 NOTE — Progress Notes (Signed)
Hematology and Oncology Follow Up Visit  [name] [id number] December 01, 1945 68 y.o. 12/17/2013   Principle Diagnosis:   Metastatic castrate resistant prostate cancer  Current Therapy:    Zytiga 1000 mg by mouth daily  Xgeva 120 mg subcutaneous Q3 months  Lupron 22 mg IM every 3 months     Interim History:  [name] is back for followup. He's doing quite well. He's had no problems his last saw him. He had a good Thanksgiving. He's not watching his right as much as he should. As such, I suspect that his blood sugars might be up a little bit.  His PSA has been holding pretty steady. His last PSA was 0.18. This is stable.  He's had no change in bowel or bladder habits. Has been no bleeding.  He's had no headache. He has had no rashes. There's been no cough. He's had no shortness of breath. He's had no leg swelling. There's been no mouth sores. He's had no visual issues.  Medications: Current outpatient prescriptions: Calcium Carbonate-Vitamin D (CALCIUM + D PO), Take 500 mg by mouth 2 (two) times daily., Disp: , Rfl: ;  gabapentin (NEURONTIN) 100 MG capsule, twice a day, Disp: , Rfl: ;  glimepiride (AMARYL) 1 MG tablet, Take 2 tablets (2 mg total) by mouth daily with breakfast., Disp: 60 tablet, Rfl: 4;  Multiple Minerals-Vitamins (CALCIUM & VIT D3 BONE HEALTH PO), Take by mouth every morning. , Disp: , Rfl:  omeprazole (PRILOSEC) 20 MG capsule, Take 20 mg by mouth daily. , Disp: , Rfl: ;  OVER THE COUNTER MEDICATION, every morning. Juice Plus, Disp: , Rfl: ;  polyethylene glycol powder (MIRALAX) powder, Take 17 g by mouth daily., Disp: , Rfl: ;  solifenacin (VESICARE) 5 MG tablet, 3 times a week, Disp: , Rfl: ;  traZODone (DESYREL) 50 MG tablet, bedtime as needed, Disp: , Rfl:  gabapentin (NEURONTIN) 100 MG capsule, TAKE 1 CAPSULE BY MOUTH EVERY 8 HOURS, Disp: 90 capsule, Rfl: 6;  metFORMIN (GLUCOPHAGE XR) 500 MG 24 hr tablet, Take 1 tablet (500 mg total) by mouth daily with breakfast.,  Disp: 60 tablet, Rfl: 4;  predniSONE (DELTASONE) 5 MG tablet, TAKE 1 TABLET BY MOUTH ONCE DAILY, Disp: 60 tablet, Rfl: 0 ZYTIGA 250 MG tablet, TAKE 4 TABLETS BY MOUTH DAILY. TAKE ON AN EMPTY STOMACH 1 HOUR BEFORE OR 2 HOURS AFTER A MEAL, Disp: 120 tablet, Rfl: 0  Allergies: No Known Allergies  Past Medical History, Surgical history, Social history, and Family History were reviewed and updated.  Review of Systems: As above  Physical Exam:  height is 5\' 8"  (1.727 m) and weight is 172 lb (78.019 kg). His oral temperature is 98.1 F (36.7 C). His blood pressure is 143/75 and his pulse is 96. His respiration is 18.   Well-developed and well-nourished white male in. Head and neck exam shows no ocular or oral lesions. The are no palpable cervical or supraclavicular lymph nodes. Lungs are clear. Cardiac exam regular rate and rhythm with no murmurs rubs or bruits. Abdomen is soft. He has good bowel sounds. There is no fluid wave. There is no palpable liver or spleen tip. Back exam shows no tenderness over the spine ribs or hips. Extremities shows no clubbing, cyanosis or edema. Skin exam no rashes. Neurological exam is nonfocal.   Lab Results  Component Value Date   WBC 9.9 12/17/2013   HGB 14.0 12/17/2013   HCT 42.5 12/17/2013   MCV 96 12/17/2013   PLT  249 12/17/2013     Chemistry      Component Value Date/Time   NA 137 12/17/2013 1336   NA 139 08/07/2013 0940   K 4.3 12/17/2013 1336   K 4.5 08/07/2013 0940   CL 99 12/17/2013 1336   CL 103 08/07/2013 0940   CO2 27 12/17/2013 1336   CO2 27 08/07/2013 0940   BUN 12 12/17/2013 1336   BUN 17 08/07/2013 0940   CREATININE 1.0 12/17/2013 1336   CREATININE 1.06 08/07/2013 0940      Component Value Date/Time   CALCIUM 9.2 12/17/2013 1336   CALCIUM 9.8 08/07/2013 0940   ALKPHOS 66 12/17/2013 1336   ALKPHOS 45 08/07/2013 0940   AST 18 12/17/2013 1336   AST 16 08/07/2013 0940   ALT 22 12/17/2013 1336   ALT 17 08/07/2013 0940   BILITOT  0.70 12/17/2013 1336   BILITOT 0.4 08/07/2013 0940         Impression and Plan: [name] is 68 year old gentleman with metastatic prostate cancer. He is doing quite well from my point of view. He is doing very well with the Zytiga. He has had no side effects. It is working.  We will see what his PSA is.  I will add metformin. Again his blood sugars are too high. At some point, he may have to see his family doctor to have his blood sugars adjusted. Hopefully, the metformin will help. I will give him 500 mg by mouth twice a day. He will take this along with the glipizide.  We will continue him on the Xgeva. We will do his Xgeva today. He will also get his Lupron. Plan to get him back to see [place] in another 6 weeks.   He will not need any other injections for 3 months. [name], MD 12/3/20155:47 PM

## 2013-12-17 NOTE — Patient Instructions (Signed)
Denosumab injection What is this medicine? DENOSUMAB (den oh sue mab) slows bone breakdown. Prolia is used to treat osteoporosis in women after menopause and in men. Xgeva is used to prevent bone fractures and other bone problems caused by cancer bone metastases. Xgeva is also used to treat giant cell tumor of the bone. This medicine may be used for other purposes; ask your health care provider or pharmacist if you have questions. COMMON BRAND NAME(S): Prolia, XGEVA What should I tell my health care provider before I take this medicine? They need to know if you have any of these conditions: -dental disease -eczema -infection or history of infections -kidney disease or on dialysis -low blood calcium or vitamin D -malabsorption syndrome -scheduled to have surgery or tooth extraction -taking medicine that contains denosumab -thyroid or parathyroid disease -an unusual reaction to denosumab, other medicines, foods, dyes, or preservatives -pregnant or trying to get pregnant -breast-feeding How should I use this medicine? This medicine is for injection under the skin. It is given by a health care professional in a hospital or clinic setting. If you are getting Prolia, a special MedGuide will be given to you by the pharmacist with each prescription and refill. Be sure to read this information carefully each time. For Prolia, talk to your pediatrician regarding the use of this medicine in children. Special care may be needed. For Xgeva, talk to your pediatrician regarding the use of this medicine in children. While this drug may be prescribed for children as young as 13 years for selected conditions, precautions do apply. Overdosage: If you think you've taken too much of this medicine contact a poison control center or emergency room at once. Overdosage: If you think you have taken too much of this medicine contact a poison control center or emergency room at once. NOTE: This medicine is only for  you. Do not share this medicine with others. What if I miss a dose? It is important not to miss your dose. Call your doctor or health care professional if you are unable to keep an appointment. What may interact with this medicine? Do not take this medicine with any of the following medications: -other medicines containing denosumab This medicine may also interact with the following medications: -medicines that suppress the immune system -medicines that treat cancer -steroid medicines like prednisone or cortisone This list may not describe all possible interactions. Give your health care provider a list of all the medicines, herbs, non-prescription drugs, or dietary supplements you use. Also tell them if you smoke, drink alcohol, or use illegal drugs. Some items may interact with your medicine. What should I watch for while using this medicine? Visit your doctor or health care professional for regular checks on your progress. Your doctor or health care professional may order blood tests and other tests to see how you are doing. Call your doctor or health care professional if you get a cold or other infection while receiving this medicine. Do not treat yourself. This medicine may decrease your body's ability to fight infection. You should make sure you get enough calcium and vitamin D while you are taking this medicine, unless your doctor tells you not to. Discuss the foods you eat and the vitamins you take with your health care professional. See your dentist regularly. Brush and floss your teeth as directed. Before you have any dental work done, tell your dentist you are receiving this medicine. Do not become pregnant while taking this medicine or for 5 months after stopping   it. Women should inform their doctor if they wish to become pregnant or think they might be pregnant. There is a potential for serious side effects to an unborn child. Talk to your health care professional or pharmacist for more  information. What side effects may I notice from receiving this medicine? Side effects that you should report to your doctor or health care professional as soon as possible: -allergic reactions like skin rash, itching or hives, swelling of the face, lips, or tongue -breathing problems -chest pain -fast, irregular heartbeat -feeling faint or lightheaded, falls -fever, chills, or any other sign of infection -muscle spasms, tightening, or twitches -numbness or tingling -skin blisters or bumps, or is dry, peels, or red -slow healing or unexplained pain in the mouth or jaw -unusual bleeding or bruising Side effects that usually do not require medical attention (Report these to your doctor or health care professional if they continue or are bothersome.): -muscle pain -stomach upset, gas This list may not describe all possible side effects. Call your doctor for medical advice about side effects. You may report side effects to FDA at 1-800-FDA-1088. Where should I keep my medicine? This medicine is only given in a clinic, doctor's office, or other health care setting and will not be stored at home. NOTE: This sheet is a summary. It may not cover all possible information. If you have questions about this medicine, talk to your doctor, pharmacist, or health care provider.  2015, Elsevier/Gold Standard. (2011-07-02 12:37:47) Leuprolide injection What is this medicine? LEUPROLIDE (loo PROE lide) is a man-made hormone. It is used to treat the symptoms of prostate cancer. This medicine may also be used to treat children with early onset of puberty. It may be used for other hormonal conditions. This medicine may be used for other purposes; ask your health care provider or pharmacist if you have questions. COMMON BRAND NAME(S): Lupron What should I tell my health care provider before I take this medicine? They need to know if you have any of these conditions: -diabetes -heart disease or previous heart  attack -high blood pressure -high cholesterol -pain or difficulty passing urine -spinal cord metastasis -stroke -tobacco smoker -an unusual or allergic reaction to leuprolide, benzyl alcohol, other medicines, foods, dyes, or preservatives -pregnant or trying to get pregnant -breast-feeding How should I use this medicine? This medicine is for injection under the skin or into a muscle. You will be taught how to prepare and give this medicine. Use exactly as directed. Take your medicine at regular intervals. Do not take your medicine more often than directed. It is important that you put your used needles and syringes in a special sharps container. Do not put them in a trash can. If you do not have a sharps container, call your pharmacist or healthcare provider to get one. Talk to your pediatrician regarding the use of this medicine in children. While this medicine may be prescribed for children as young as 8 years for selected conditions, precautions do apply. Overdosage: If you think you have taken too much of this medicine contact a poison control center or emergency room at once. NOTE: This medicine is only for you. Do not share this medicine with others. What if I miss a dose? If you miss a dose, take it as soon as you can. If it is almost time for your next dose, take only that dose. Do not take double or extra doses. What may interact with this medicine? Do not take this medicine with  any of the following medications: -chasteberry This medicine may also interact with the following medications: -herbal or dietary supplements, like black cohosh or DHEA -male hormones, like estrogens or progestins and birth control pills, patches, rings, or injections -male hormones, like testosterone This list may not describe all possible interactions. Give your health care provider a list of all the medicines, herbs, non-prescription drugs, or dietary supplements you use. Also tell them if you smoke,  drink alcohol, or use illegal drugs. Some items may interact with your medicine. What should I watch for while using this medicine? Visit your doctor or health care professional for regular checks on your progress. During the first week, your symptoms may get worse, but then will improve as you continue your treatment. You may get hot flashes, increased bone pain, increased difficulty passing urine, or an aggravation of nerve symptoms. Discuss these effects with your doctor or health care professional, some of them may improve with continued use of this medicine. Male patients may experience a menstrual cycle or spotting during the first 2 months of therapy with this medicine. If this continues, contact your doctor or health care professional. What side effects may I notice from receiving this medicine? Side effects that you should report to your doctor or health care professional as soon as possible: -allergic reactions like skin rash, itching or hives, swelling of the face, lips, or tongue -breathing problems -chest pain -depression or memory disorders -pain in your legs or groin -pain at site where injected -severe headache -swelling of the feet and legs -visual changes -vomiting Side effects that usually do not require medical attention (report to your doctor or health care professional if they continue or are bothersome): -breast swelling or tenderness -decrease in sex drive or performance -diarrhea -hot flashes -loss of appetite -muscle, joint, or bone pains -nausea -redness or irritation at site where injected -skin problems or acne This list may not describe all possible side effects. Call your doctor for medical advice about side effects. You may report side effects to FDA at 1-800-FDA-1088. Where should I keep my medicine? Keep out of the reach of children. Store below 25 degrees C (77 degrees F). Do not freeze. Protect from light. Do not use if it is not clear or if there  are particles present. Throw away any unused medicine after the expiration date. NOTE: This sheet is a summary. It may not cover all possible information. If you have questions about this medicine, talk to your doctor, pharmacist, or health care provider.  2015, Elsevier/Gold Standard. (2008-05-18 13:26:20)

## 2013-12-18 LAB — PSA: PSA: 0.24 ng/mL (ref <=4.00)

## 2013-12-22 ENCOUNTER — Ambulatory Visit: Payer: Medicare Other | Admitting: Hematology & Oncology

## 2013-12-22 ENCOUNTER — Telehealth: Payer: Self-pay | Admitting: *Deleted

## 2013-12-22 ENCOUNTER — Ambulatory Visit: Payer: Medicare Other

## 2013-12-22 ENCOUNTER — Other Ambulatory Visit: Payer: Medicare Other | Admitting: Lab

## 2013-12-22 NOTE — Telephone Encounter (Addendum)
-----   Message from [name], MD sent at 12/18/2013  4:30 PM EST ----- Please call and let him know that the PSA is 0.24. This is up a little bit. We still have to monitor this. We will see what it is when he comes back.Laurey Arrow -Informed pt that PSA is 0.24. This is up a little bit. We still have to monitor this. We will see what it is when he comes back. Pt verbalized understanding.

## 2014-01-15 HISTORY — PX: SPINAL CORD STIMULATOR IMPLANT: SHX2422

## 2014-01-15 HISTORY — PX: BACK SURGERY: SHX140

## 2014-01-18 ENCOUNTER — Other Ambulatory Visit: Payer: Self-pay | Admitting: *Deleted

## 2014-01-18 DIAGNOSIS — C61 Malignant neoplasm of prostate: Secondary | ICD-10-CM

## 2014-01-18 MED ORDER — ABIRATERONE ACETATE 250 MG PO TABS: 1000.0000 mg | ORAL_TABLET | Freq: Every day | ORAL | Status: DC

## 2014-01-27 ENCOUNTER — Encounter: Payer: Self-pay | Admitting: Hematology & Oncology

## 2014-01-27 ENCOUNTER — Ambulatory Visit (HOSPITAL_BASED_OUTPATIENT_CLINIC_OR_DEPARTMENT_OTHER): Payer: Medicare Other | Admitting: Hematology & Oncology

## 2014-01-27 ENCOUNTER — Other Ambulatory Visit (HOSPITAL_BASED_OUTPATIENT_CLINIC_OR_DEPARTMENT_OTHER): Payer: Medicare Other | Admitting: Lab

## 2014-01-27 VITALS — BP 124/74 | HR 85 | Temp 98.3°F | Resp 18 | Ht 67.0 in | Wt 166.0 lb

## 2014-01-27 DIAGNOSIS — C61 Malignant neoplasm of prostate: Secondary | ICD-10-CM | POA: Diagnosis not present

## 2014-01-27 DIAGNOSIS — C7951 Secondary malignant neoplasm of bone: Secondary | ICD-10-CM | POA: Diagnosis not present

## 2014-01-27 DIAGNOSIS — R739 Hyperglycemia, unspecified: Secondary | ICD-10-CM | POA: Diagnosis not present

## 2014-01-27 LAB — CMP (CANCER CENTER ONLY)
ALT(SGPT): 17 U/L (ref 10–47)
AST: 19 U/L (ref 11–38)
Albumin: 3.8 g/dL (ref 3.3–5.5)
Alkaline Phosphatase: 55 U/L (ref 26–84)
BUN, Bld: 13 mg/dL (ref 7–22)
CO2: 29 mEq/L (ref 18–33)
Calcium: 9.9 mg/dL (ref 8.0–10.3)
Chloride: 100 mEq/L (ref 98–108)
Creat: 1 mg/dl (ref 0.6–1.2)
Glucose, Bld: 91 mg/dL (ref 73–118)
Potassium: 4.1 mEq/L (ref 3.3–4.7)
Sodium: 138 mEq/L (ref 128–145)
Total Bilirubin: 0.7 mg/dl (ref 0.20–1.60)
Total Protein: 7.3 g/dL (ref 6.4–8.1)

## 2014-01-27 LAB — CBC WITH DIFFERENTIAL (CANCER CENTER ONLY)
BASO#: 0.1 10*3/uL (ref 0.0–0.2)
BASO%: 0.6 % (ref 0.0–2.0)
EOS%: 1.3 % (ref 0.0–7.0)
Eosinophils Absolute: 0.1 10*3/uL (ref 0.0–0.5)
HCT: 40.7 % (ref 38.7–49.9)
HGB: 13.4 g/dL (ref 13.0–17.1)
LYMPH#: 1.5 10*3/uL (ref 0.9–3.3)
LYMPH%: 17.2 % (ref 14.0–48.0)
MCH: 31.5 pg (ref 28.0–33.4)
MCHC: 32.9 g/dL (ref 32.0–35.9)
MCV: 96 fL (ref 82–98)
MONO#: 0.5 10*3/uL (ref 0.1–0.9)
MONO%: 6.1 % (ref 0.0–13.0)
NEUT#: 6.4 10*3/uL (ref 1.5–6.5)
NEUT%: 74.8 % (ref 40.0–80.0)
Platelets: 243 10*3/uL (ref 145–400)
RBC: 4.26 10*6/uL (ref 4.20–5.70)
RDW: 12.6 % (ref 11.1–15.7)
WBC: 8.6 10*3/uL (ref 4.0–10.0)

## 2014-01-27 NOTE — Progress Notes (Signed)
Hematology and Oncology Follow Up Visit  Leor Whyte [id number] December 16, 1945 69 y.o. 01/27/2014   Principle Diagnosis:   Metastatic castrate resistant prostate cancer  Current Therapy:    Zytiga 1000 mg by mouth daily  Xgeva 120 mg subcutaneous Q3 months  Lupron 22 mg IM every 3 months     Interim History:  Mr.  [name] is back for followup. He is doing well. 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 He tried a land on a bench and it broke. He fell onto his left side. He strained his left lower back. This appears better. He is doing Tylenol and ice.  His last PSA, in December, was 0.24. This is up a little bit. However, we are just monitoring this.  He's had no problems with his blood sugars. His wife is really being diligent in trying to monitor what he eats.  Over the holidays, he did well. He was pretty strict with what he had to eat.  There's been no problems with nausea or vomiting. He's had no fever. He's had a rashes. He's had no bleeding. He's had no change in bowel or bladder habits. He's had no leg swelling. He's had no mouth sores.  There's been no headaches.    Medications:  Current outpatient prescriptions:  .  abiraterone Acetate (ZYTIGA) 250 MG tablet, Take 4 tablets (1,000 mg total) by mouth daily. Take on an empty stomach 1 hour before or 2 hours after a meal, Disp: 120 tablet, Rfl: 0 .  Calcium Carbonate-Vitamin D (CALCIUM + D PO), Take 500 mg by mouth 2 (two) times daily., Disp: , Rfl:  .  gabapentin (NEURONTIN) 100 MG capsule, TAKE 1 CAPSULE BY MOUTH EVERY 8 HOURS, Disp: 90 capsule, Rfl: 6 .  glimepiride (AMARYL) 1 MG tablet, Take 2 tablets (2 mg total) by mouth daily with breakfast., Disp: 60 tablet, Rfl: 4 .  metFORMIN (GLUCOPHAGE XR) 500 MG 24 hr tablet, Take 1 tablet (500 mg total) by mouth daily with breakfast., Disp: 60 tablet, Rfl: 4 .  Multiple Minerals-Vitamins (CALCIUM & VIT D3 BONE HEALTH PO), Take by mouth every morning. , Disp: , Rfl:  .   omeprazole (PRILOSEC) 20 MG capsule, Take 20 mg by mouth daily. , Disp: , Rfl:  .  OVER THE COUNTER MEDICATION, every morning. Juice Plus, Disp: , Rfl:  .  polyethylene glycol powder (MIRALAX) powder, Take 17 g by mouth daily., Disp: , Rfl:  .  predniSONE (DELTASONE) 5 MG tablet, TAKE 1 TABLET BY MOUTH ONCE DAILY, Disp: 60 tablet, Rfl: 0 .  solifenacin (VESICARE) 5 MG tablet, 3 times a week, Disp: , Rfl:  .  traZODone (DESYREL) 50 MG tablet, bedtime as needed, Disp: , Rfl:   Allergies: No Known Allergies  Past Medical History, Surgical history, Social history, and Family History were reviewed and updated.  Review of Systems: As above  Physical Exam:  height is 5\' 7"  (1.702 m) and weight is 166 lb (75.297 kg). His oral temperature is 98.3 F (36.8 C). His blood pressure is 124/74 and his pulse is 85. His respiration is 18.   Well-developed and well-nourished white male in. Head and neck exam shows no ocular or oral lesions. The are no palpable cervical or supraclavicular lymph nodes. Lungs are clear. Cardiac exam regular rate and rhythm with no murmurs rubs or bruits. Abdomen is soft. He has good bowel sounds. There is no fluid wave. There is no palpable liver or spleen tip. Back exam shows no tenderness  over the spine ribs or hips. Extremities shows no clubbing, cyanosis or edema. Skin exam no rashes. Neurological exam is nonfocal.   Lab Results  Component Value Date   WBC 8.6 01/27/2014   HGB 13.4 01/27/2014   HCT 40.7 01/27/2014   MCV 96 01/27/2014   PLT 243 01/27/2014     Chemistry      Component Value Date/Time   NA 138 01/27/2014 1439   NA 139 08/07/2013 0940   K 4.1 01/27/2014 1439   K 4.5 08/07/2013 0940   CL 100 01/27/2014 1439   CL 103 08/07/2013 0940   CO2 29 01/27/2014 1439   CO2 27 08/07/2013 0940   BUN 13 01/27/2014 1439   BUN 17 08/07/2013 0940   CREATININE 1.0 01/27/2014 1439   CREATININE 1.06 08/07/2013 0940      Component Value Date/Time   CALCIUM 9.9  01/27/2014 1439   CALCIUM 9.8 08/07/2013 0940   ALKPHOS 55 01/27/2014 1439   ALKPHOS 45 08/07/2013 0940   AST 19 01/27/2014 1439   AST 16 08/07/2013 0940   ALT 17 01/27/2014 1439   ALT 17 08/07/2013 0940   BILITOT 0.70 01/27/2014 1439   BILITOT 0.4 08/07/2013 0940         Impression and Plan: Mr. Nauta is 69 year old gentleman with metastatic prostate cancer. He is doing quite well from my point of view. He is doing very well with the Zytiga. He has had no side effects. It is working.  We will see what his PSA is. His last PSA was up just a little bit.  I think that the metformin is helping quite a bit. His blood sugars are better.  I think the back discomfort that he has is probably a strain from when he fell. I would not do any x-ray test.  We will continue him on the Xgeva. We will do his Xgeva with his next appointment. He will also get his Lupron with his next appointment.   Plan to get him back to see [place] in another 6 weeks.    [name], MD 1/13/20165:55 PM

## 2014-01-28 ENCOUNTER — Telehealth: Payer: Self-pay

## 2014-01-28 ENCOUNTER — Ambulatory Visit: Payer: Medicare Other | Admitting: Hematology & Oncology

## 2014-01-28 ENCOUNTER — Other Ambulatory Visit: Payer: Medicare Other | Admitting: Lab

## 2014-01-28 LAB — HEMOGLOBIN A1C
Hgb A1c MFr Bld: 6.1 % — ABNORMAL HIGH (ref <5.7)
Mean Plasma Glucose: 128 mg/dL — ABNORMAL HIGH (ref <117)

## 2014-01-28 LAB — PSA: PSA: 0.26 ng/mL (ref <=4.00)

## 2014-01-28 NOTE — Telephone Encounter (Addendum)
-----   Message from [name], MD sent at 01/28/2014  7:50 AM EST ----- Call - PSA is basicaly stable at 0.26.  Pete  Message left on generic VM to contact our office for lab results. dph

## 2014-02-03 ENCOUNTER — Other Ambulatory Visit: Payer: Self-pay | Admitting: Hematology & Oncology

## 2014-02-12 DIAGNOSIS — H47233 Glaucomatous optic atrophy, bilateral: Secondary | ICD-10-CM | POA: Diagnosis not present

## 2014-02-12 DIAGNOSIS — H40013 Open angle with borderline findings, low risk, bilateral: Secondary | ICD-10-CM | POA: Diagnosis not present

## 2014-02-14 ENCOUNTER — Emergency Department (HOSPITAL_BASED_OUTPATIENT_CLINIC_OR_DEPARTMENT_OTHER): Payer: Medicare Other

## 2014-02-14 ENCOUNTER — Emergency Department (HOSPITAL_BASED_OUTPATIENT_CLINIC_OR_DEPARTMENT_OTHER)
Admission: EM | Admit: 2014-02-14 | Discharge: 2014-02-14 | Disposition: A | Discharge status: Home / Self Care | Payer: Medicare Other | Attending: Emergency Medicine | Admitting: Emergency Medicine

## 2014-02-14 ENCOUNTER — Encounter (HOSPITAL_BASED_OUTPATIENT_CLINIC_OR_DEPARTMENT_OTHER): Payer: Self-pay

## 2014-02-14 DIAGNOSIS — M79604 Pain in right leg: Secondary | ICD-10-CM | POA: Diagnosis not present

## 2014-02-14 DIAGNOSIS — Z8546 Personal history of malignant neoplasm of prostate: Secondary | ICD-10-CM | POA: Diagnosis not present

## 2014-02-14 DIAGNOSIS — M79651 Pain in right thigh: Secondary | ICD-10-CM | POA: Diagnosis not present

## 2014-02-14 DIAGNOSIS — Z7952 Long term (current) use of systemic steroids: Secondary | ICD-10-CM | POA: Diagnosis not present

## 2014-02-14 DIAGNOSIS — Z79899 Other long term (current) drug therapy: Secondary | ICD-10-CM | POA: Insufficient documentation

## 2014-02-14 MED ORDER — HYDROCODONE-ACETAMINOPHEN 5-325 MG PO TABS
2.0000 | ORAL_TABLET | Freq: Once | ORAL | Status: AC
  Administered 2014-02-14: 2 via ORAL
  Filled 2014-02-14: qty 2

## 2014-02-14 MED ORDER — CYCLOBENZAPRINE HCL 10 MG PO TABS: 10.0000 mg | ORAL_TABLET | Freq: Two times a day (BID) | ORAL | Status: DC | PRN

## 2014-02-14 MED ORDER — HYDROCODONE-ACETAMINOPHEN 5-325 MG PO TABS: 1.0000 | ORAL_TABLET | Freq: Four times a day (QID) | ORAL | Status: DC | PRN

## 2014-02-14 NOTE — ED Provider Notes (Signed)
CSN: [id number]     Arrival date & time 02/14/14  0830 History   First MD Initiated Contact with Patient 02/14/14 [phone]     Chief Complaint  Patient presents with  . Leg Pain     (Consider location/radiation/quality/duration/timing/severity/associated sxs/prior Treatment) Patient is a 69 y.o. male presenting with leg pain. The history is provided by the patient.  Leg Pain Location:  Leg Leg location:  R upper leg Pain details:    Quality:  Throbbing   Radiates to:  Does not radiate   Severity:  Mild   Onset quality:  Gradual   Timing:  Intermittent   Progression:  Unchanged Chronicity:  New Dislocation: no   Relieved by: standing. Exacerbated by: being still. Associated symptoms: no fever     Past Medical History  Diagnosis Date  . Prostate cancer 07/23/2011  . High blood sugar    Past Surgical History  Procedure Laterality Date  . Robot assisted laparoscopic radical prostatectomy    . Hand surgery Left   . Tonsilectomy/adenoidectomy with myringotomy    . Circumcision      when patient was 69 years old   Family History  Problem Relation Age of Onset  . Prostate cancer Father    History  Substance Use Topics  . Smoking status: Never Smoker   . Smokeless tobacco: Never Used     Comment: never used tobacco  . Alcohol Use: No    Review of Systems  Constitutional: Negative for fever.  Respiratory: Negative for cough and shortness of breath.   Cardiovascular: Negative for chest pain and leg swelling.  Gastrointestinal: Negative for vomiting and abdominal pain.  All other systems reviewed and are negative.     Allergies  Review of patient's allergies indicates no known allergies.  Home Medications   Prior to Admission medications   Medication Sig Start Date End Date Taking? Authorizing Provider  abiraterone Acetate (ZYTIGA) 250 MG tablet Take 4 tablets (1,000 mg total) by mouth daily. Take on an empty stomach 1 hour before or 2 hours after a meal 01/18/14    [name], MD  Calcium Carbonate-Vitamin D (CALCIUM + D PO) Take 500 mg by mouth 2 (two) times daily.    Historical Provider, MD  gabapentin (NEURONTIN) 100 MG capsule TAKE 1 CAPSULE BY MOUTH EVERY 8 HOURS 12/17/13   [name], MD  glimepiride (AMARYL) 1 MG tablet TAKE TWO TABLETS BY MOUTH DAILY WITH BREAKFAST 02/03/14   [name], MD  metFORMIN (GLUCOPHAGE XR) 500 MG 24 hr tablet Take 1 tablet (500 mg total) by mouth daily with breakfast. 12/17/13   [name], MD  Multiple Minerals-Vitamins (CALCIUM & VIT D3 BONE HEALTH PO) Take by mouth every morning.     Historical Provider, MD  omeprazole (PRILOSEC) 20 MG capsule Take 20 mg by mouth daily.  12/07/11   Historical Provider, MD  OVER THE COUNTER MEDICATION every morning. Juice Plus    Historical Provider, MD  polyethylene glycol powder (MIRALAX) powder Take 17 g by mouth daily.    Historical Provider, MD  predniSONE (DELTASONE) 5 MG tablet TAKE 1 TABLET BY MOUTH ONCE DAILY 12/17/13   [name], MD  solifenacin (VESICARE) 5 MG tablet 3 times a week 10/02/13   [name], MD  traZODone (DESYREL) 50 MG tablet bedtime as needed 05/25/13   [name], MD   BP 145/79 mmHg  Pulse 68  Temp(Src) 98 F (36.7 C)  Resp 18  Wt 166 lb (75.297 kg)  SpO2 100% Physical Exam  Constitutional: He is oriented to person, place, and time. He appears well-developed and well-nourished. No distress.  HENT:  Head: Normocephalic and atraumatic.  Mouth/Throat: No oropharyngeal exudate.  Eyes: EOM are normal. Pupils are equal, round, and reactive to light.  Neck: Normal range of motion. Neck supple.  Cardiovascular: Normal rate and regular rhythm.  Exam reveals no friction rub.   No murmur heard. Pulmonary/Chest: Effort normal and breath sounds normal. No respiratory distress. He has no wheezes. He has no rales.  Abdominal: He exhibits no distension. There is no tenderness. There is no rebound.  Musculoskeletal: Normal range of  motion. He exhibits no edema.  No bony tenderness No swelling NVI distally  Neurological: He is alert and oriented to person, place, and time.  Skin: He is not diaphoretic.  Nursing note and vitals reviewed.   ED Course  Procedures (including critical care time) Labs Review Labs Reviewed - No data to display  Imaging Review No results found.   EKG Interpretation None      MDM   Final diagnoses:  Right leg pain    51M here with R thigh pain. Began 2 days ago, described as throbbing, worse with being still/sitting, better with standing. No trauma. Hx of prostate cancer with sacral mets. No falls. On exam, vitals stable. No palpable bony pelvis pain. He locates pain to posterior thigh in the hamstring muscle. No bony tenderness. No swelling. NVI distally. Will check [place] for possible DVT since he's on chemo and check xrays to look for new bony mets. All imaging normal. Stable for discharge.    Evelina Bucy, MD 02/14/14 1254

## 2014-02-14 NOTE — Discharge Instructions (Signed)

## 2014-02-14 NOTE — ED Notes (Signed)
Patient here with right thigh pain x 2 days, reports that when he sits it throbs and better with ambulation, no known injury, denies travel

## 2014-02-15 ENCOUNTER — Encounter: Payer: Self-pay | Admitting: Hematology & Oncology

## 2014-02-15 ENCOUNTER — Other Ambulatory Visit (HOSPITAL_BASED_OUTPATIENT_CLINIC_OR_DEPARTMENT_OTHER): Payer: Medicare Other | Admitting: Lab

## 2014-02-15 ENCOUNTER — Encounter: Payer: Self-pay | Admitting: Family

## 2014-02-15 ENCOUNTER — Ambulatory Visit (HOSPITAL_BASED_OUTPATIENT_CLINIC_OR_DEPARTMENT_OTHER): Payer: Medicare Other | Admitting: Family

## 2014-02-15 ENCOUNTER — Other Ambulatory Visit: Payer: Self-pay | Admitting: Hematology & Oncology

## 2014-02-15 ENCOUNTER — Other Ambulatory Visit: Payer: Self-pay | Admitting: Nurse Practitioner

## 2014-02-15 VITALS — BP 142/84 | HR 85 | Temp 97.8°F | Wt 162.0 lb

## 2014-02-15 DIAGNOSIS — C61 Malignant neoplasm of prostate: Secondary | ICD-10-CM | POA: Diagnosis not present

## 2014-02-15 DIAGNOSIS — M79651 Pain in right thigh: Secondary | ICD-10-CM | POA: Insufficient documentation

## 2014-02-15 HISTORY — DX: Pain in right thigh: M79.651

## 2014-02-15 LAB — CMP (CANCER CENTER ONLY)
ALT(SGPT): 17 U/L (ref 10–47)
AST: 19 U/L (ref 11–38)
Albumin: 3.9 g/dL (ref 3.3–5.5)
Alkaline Phosphatase: 54 U/L (ref 26–84)
BUN, Bld: 10 mg/dL (ref 7–22)
CO2: 28 mEq/L (ref 18–33)
Calcium: 9.8 mg/dL (ref 8.0–10.3)
Chloride: 100 mEq/L (ref 98–108)
Creat: 0.9 mg/dl (ref 0.6–1.2)
Glucose, Bld: 117 mg/dL (ref 73–118)
Potassium: 3.6 mEq/L (ref 3.3–4.7)
Sodium: 142 mEq/L (ref 128–145)
Total Bilirubin: 0.9 mg/dl (ref 0.20–1.60)
Total Protein: 7.8 g/dL (ref 6.4–8.1)

## 2014-02-15 LAB — CBC WITH DIFFERENTIAL (CANCER CENTER ONLY)
BASO#: 0.1 10*3/uL (ref 0.0–0.2)
BASO%: 1 % (ref 0.0–2.0)
EOS%: 2.5 % (ref 0.0–7.0)
Eosinophils Absolute: 0.2 10*3/uL (ref 0.0–0.5)
HCT: 44 % (ref 38.7–49.9)
HGB: 14.6 g/dL (ref 13.0–17.1)
LYMPH#: 1.7 10*3/uL (ref 0.9–3.3)
LYMPH%: 27.5 % (ref 14.0–48.0)
MCH: 31.5 pg (ref 28.0–33.4)
MCHC: 33.2 g/dL (ref 32.0–35.9)
MCV: 95 fL (ref 82–98)
MONO#: 0.4 10*3/uL (ref 0.1–0.9)
MONO%: 7.1 % (ref 0.0–13.0)
NEUT#: 3.7 10*3/uL (ref 1.5–6.5)
NEUT%: 61.9 % (ref 40.0–80.0)
Platelets: 238 10*3/uL (ref 145–400)
RBC: 4.63 10*6/uL (ref 4.20–5.70)
RDW: 12.7 % (ref 11.1–15.7)
WBC: 6 10*3/uL (ref 4.0–10.0)

## 2014-02-15 MED ORDER — TRAMADOL HCL 50 MG PO TABS: 50.0000 mg | ORAL_TABLET | Freq: Four times a day (QID) | ORAL | Status: DC | PRN

## 2014-02-15 NOTE — Addendum Note (Signed)
Addended by: [name] on: 02/15/2014 06:28 PM   Modules accepted: Orders

## 2014-02-15 NOTE — Progress Notes (Signed)
[facility]  [phone] [phone]) [phone]  ID: [name] OB: May 30, 1945 MR#: [id number] RFX#:[id number] Patient Care Team: Nicoletta Dress, MD as PCP - General (Internal Medicine)  DIAGNOSIS: Metastatic castrate resistant prostate cancer  INTERVAL HISTORY: Mr.Bache is here today with his wife and sister for c/o right posterior thigh pain. He went to the ED last night and had xrays done of his hip and pelvis. Both were normal. He also had an ultrasound of his right leg that was negative for a DVT.  He was given hydrocodone for pain and made him sick with n/v. He tried a muscle relaxer and did not feel that it helped.  His lab work today is normal. He is still complaining of a throbbing pain in his right posterior thigh.  He has fallen twice in the last month. The last incident also involved a retaining wall falling on him. The other time he fell into some bushes.   He denies fever, chills, cough, rash, headache, dizziness, SOB, chest pain, palpitations, abdominal pain, constipation, diarrhea, blood in urine or stool.  No swelling, numbness or tingling in his extremities.  His appetite is good and he is hydrated. His weight is stable at 162 lbs.  His PSA earlier this month was 0.26.   CURRENT TREATMENT: Zytiga 1000 mg by mouth daily Xgeva 120 mg subcutaneous Q3 months Lupron 22 mg IM every 3 months  REVIEW OF SYSTEMS: All other 10 point review of systems is negative.   PAST MEDICAL HISTORY: Past Medical History  Diagnosis Date  . Prostate cancer 07/23/2011  . High blood sugar     PAST SURGICAL HISTORY: Past Surgical History  Procedure Laterality Date  . Robot assisted laparoscopic radical prostatectomy    . Hand surgery Left   . Tonsilectomy/adenoidectomy with myringotomy    . Circumcision      when patient was 69 years old    FAMILY HISTORY Family History  Problem Relation Age of Onset  . Prostate cancer Father     GYNECOLOGIC HISTORY:   No LMP for male patient.   SOCIAL HISTORY: History   Social History  . Marital Status: Married    Spouse Name: N/A    Number of Children: 2  . Years of Education: N/A   Occupational History  . real estate aprais.    Social History Main Topics  . Smoking status: Never Smoker   . Smokeless tobacco: Never Used     Comment: never used tobacco  . Alcohol Use: No  . Drug Use: Not on file  . Sexual Activity: Not on file   Other Topics Concern  . Not on file   Social History Narrative    ADVANCED DIRECTIVES:  <no information>  HEALTH MAINTENANCE: History  Substance Use Topics  . Smoking status: Never Smoker   . Smokeless tobacco: Never Used     Comment: never used tobacco  . Alcohol Use: No   Colonoscopy: PAP: Bone density: Lipid panel:  Allergies  Allergen Reactions  . Codeine Nausea And Vomiting  . Hydrocodone Nausea And Vomiting    Current Outpatient Prescriptions  Medication Sig Dispense Refill  . abiraterone Acetate (ZYTIGA) 250 MG tablet Take 4 tablets (1,000 mg total) by mouth daily. Take on an empty stomach 1 hour before or 2 hours after a meal 120 tablet 0  . Calcium Carbonate-Vitamin D (CALCIUM + D PO) Take 500 mg by mouth 2 (two) times daily.    . cyclobenzaprine (FLEXERIL) 10 MG tablet  Take 1 tablet (10 mg total) by mouth 2 (two) times daily as needed for muscle spasms. 20 tablet 0  . gabapentin (NEURONTIN) 100 MG capsule TAKE 1 CAPSULE BY MOUTH EVERY 8 HOURS 90 capsule 6  . glimepiride (AMARYL) 1 MG tablet TAKE TWO TABLETS BY MOUTH DAILY WITH BREAKFAST 60 tablet 4  . metFORMIN (GLUCOPHAGE XR) 500 MG 24 hr tablet Take 1 tablet (500 mg total) by mouth daily with breakfast. 60 tablet 4  . Multiple Minerals-Vitamins (CALCIUM & VIT D3 BONE HEALTH PO) Take by mouth every morning.     Marland Kitchen omeprazole (PRILOSEC) 20 MG capsule Take 20 mg by mouth daily.     Marland Kitchen OVER THE COUNTER MEDICATION every morning. Juice Plus    . polyethylene glycol powder (MIRALAX) powder  Take 17 g by mouth daily.    . predniSONE (DELTASONE) 5 MG tablet TAKE 1 TABLET BY MOUTH ONCE DAILY 60 tablet 0  . solifenacin (VESICARE) 5 MG tablet 3 times a week    . traZODone (DESYREL) 50 MG tablet bedtime as needed     No current facility-administered medications for this visit.    OBJECTIVE: Filed Vitals:   02/15/14 0952  BP: 142/84  Pulse: 85  Temp: 97.8 F (36.6 C)    Filed Weights   02/15/14 0952  Weight: 162 lb (73.483 kg)   ECOG FS:1 - Symptomatic but completely ambulatory Ocular: Sclerae unicteric, pupils equal, round and reactive to light Ear-nose-throat: Oropharynx clear, dentition fair Lymphatic: No cervical or supraclavicular adenopathy Lungs no rales or rhonchi, good excursion bilaterally Heart regular rate and rhythm, no murmur appreciated Abd soft, nontender, positive bowel sounds MSK no focal spinal tenderness, no joint edema Neuro: non-focal, well-oriented, appropriate affect  LAB RESULTS: CMP     Component Value Date/Time   NA 138 01/27/2014 1439   NA 139 08/07/2013 0940   K 4.1 01/27/2014 1439   K 4.5 08/07/2013 0940   CL 100 01/27/2014 1439   CL 103 08/07/2013 0940   CO2 29 01/27/2014 1439   CO2 27 08/07/2013 0940   GLUCOSE 91 01/27/2014 1439   GLUCOSE 156* 08/07/2013 0940   BUN 13 01/27/2014 1439   BUN 17 08/07/2013 0940   CREATININE 1.0 01/27/2014 1439   CREATININE 1.06 08/07/2013 0940   CALCIUM 9.9 01/27/2014 1439   CALCIUM 9.8 08/07/2013 0940   PROT 7.3 01/27/2014 1439   PROT 6.5 08/07/2013 0940   ALBUMIN 4.0 08/07/2013 0940   AST 19 01/27/2014 1439   AST 16 08/07/2013 0940   ALT 17 01/27/2014 1439   ALT 17 08/07/2013 0940   ALKPHOS 55 01/27/2014 1439   ALKPHOS 45 08/07/2013 0940   BILITOT 0.70 01/27/2014 1439   BILITOT 0.4 08/07/2013 0940   INo results found for: SPEP, UPEP Lab Results  Component Value Date   WBC 6.0 02/15/2014   NEUTROABS 3.7 02/15/2014   HGB 14.6 02/15/2014   HCT 44.0 02/15/2014   MCV 95 02/15/2014    PLT 238 02/15/2014     No results found for: LABCA2 No components found for: LABCA125 No results for input(s): INR in the last 168 hours. Urinalysis No results found for: COLORURINE, APPEARANCEUR, LABSPEC, PHURINE, GLUCOSEU, HGBUR, BILIRUBINUR, KETONESUR, PROTEINUR, UROBILINOGEN, NITRITE, LEUKOCYTESUR STUDIES: Dg Pelvis 1-2 Views  02/14/2014   CLINICAL DATA:  Acute right leg pain for 2 days without known injury. Initial encounter.  EXAM: PELVIS - 1-2 VIEW  COMPARISON:  None.  FINDINGS: There is no evidence of pelvic fracture or diastasis.  No pelvic bone lesions are seen. Sacroiliac and hip joints appear normal.  IMPRESSION: Normal pelvis.   Electronically Signed   By: Sabino Dick M.D.   On: 02/14/2014 10:17   US Venous Img Lower Unilateral Right  02/14/2014   CLINICAL DATA:  Right thigh pain.  EXAM: Right LOWER EXTREMITY VENOUS DOPPLER ULTRASOUND  TECHNIQUE: Gray-scale sonography with graded compression, as well as color Doppler and duplex ultrasound were performed to evaluate the lower extremity deep venous systems from the level of the common femoral vein and including the common femoral, femoral, profunda femoral, popliteal and calf veins including the posterior tibial, peroneal and gastrocnemius veins when visible. The superficial great saphenous vein was also interrogated. Spectral Doppler was utilized to evaluate flow at rest and with distal augmentation maneuvers in the common femoral, femoral and popliteal veins.  COMPARISON:  None.  FINDINGS: Contralateral Common Femoral Vein: Respiratory phasicity is normal and symmetric with the symptomatic side. No evidence of thrombus. Normal compressibility.  Common Femoral Vein: No evidence of thrombus. Normal compressibility, respiratory phasicity and response to augmentation.  Saphenofemoral Junction: No evidence of thrombus. Normal compressibility and flow on color Doppler imaging.  Profunda Femoral Vein: No evidence of thrombus. Normal  compressibility and flow on color Doppler imaging.  Femoral Vein: No evidence of thrombus. Normal compressibility, respiratory phasicity and response to augmentation.  Popliteal Vein: No evidence of thrombus. Normal compressibility, respiratory phasicity and response to augmentation.  Calf Veins: No evidence of thrombus. Normal compressibility and flow on color Doppler imaging.  Superficial Great Saphenous Vein: No evidence of thrombus. Normal compressibility and flow on color Doppler imaging.  Venous Reflux:  None.  Other Findings:  None.  IMPRESSION: No evidence of deep venous thrombosis seen in right lower extremity.   Electronically Signed   By: Sabino Dick M.D.   On: 02/14/2014 12:28   Dg Femur, Min 2 Views Right  02/14/2014   CLINICAL DATA:  Acute right lower extremity pain for 2 days. No known injury. Initial encounter.  EXAM: DG FEMUR 2+V*R*  COMPARISON:  None.  FINDINGS: There is no evidence of fracture or other focal bone lesions. Soft tissues are unremarkable.  IMPRESSION: Normal right femur.   Electronically Signed   By: Sabino Dick M.D.   On: 02/14/2014 10:14    ASSESSMENT/PLAN: Mr. Viney is 69 year old gentleman with metastatic prostate cancer. He is doing very well with the Zytiga. His PSA earlier this month was 0.26.  Unfortunately, he has had some recent falls and may have hurt his back in some way.  We will get an MRI of the lumbar spine.  His CBC and CMP today were normal. We will see what his PSA is.  He has a scheduled appointment later this month with DR. Ennever which we will keep.  He knows to call here with any questions or concerns and to go to the ED in the event of an emergency. We can certainly see him sooner if needed.   Eliezer Bottom, NP 02/15/2014 9:57 AM

## 2014-02-16 LAB — PSA: PSA: 0.33 ng/mL (ref <=4.00)

## 2014-02-16 LAB — URIC ACID: Uric Acid, Serum: 3.3 mg/dL — ABNORMAL LOW (ref 4.0–7.8)

## 2014-02-17 ENCOUNTER — Telehealth: Payer: Self-pay | Admitting: Hematology & Oncology

## 2014-02-17 ENCOUNTER — Ambulatory Visit (HOSPITAL_BASED_OUTPATIENT_CLINIC_OR_DEPARTMENT_OTHER): Payer: Medicare Other

## 2014-02-17 NOTE — Telephone Encounter (Signed)
RN aware pt called wanting to get his MRI sooner because his pain has moved down his leg.

## 2014-02-20 ENCOUNTER — Ambulatory Visit (HOSPITAL_BASED_OUTPATIENT_CLINIC_OR_DEPARTMENT_OTHER)
Admission: RE | Admit: 2014-02-20 | Discharge: 2014-02-20 | Disposition: A | Discharge status: Home / Self Care | Payer: Medicare Other | Source: Ambulatory Visit | Attending: Hematology & Oncology | Admitting: Hematology & Oncology

## 2014-02-20 ENCOUNTER — Ambulatory Visit (HOSPITAL_BASED_OUTPATIENT_CLINIC_OR_DEPARTMENT_OTHER)
Admission: RE | Admit: 2014-02-20 | Discharge: 2014-02-20 | Disposition: A | Discharge status: Home / Self Care | Payer: Medicare Other | Source: Ambulatory Visit | Attending: Family | Admitting: Family

## 2014-02-20 DIAGNOSIS — C61 Malignant neoplasm of prostate: Secondary | ICD-10-CM | POA: Diagnosis not present

## 2014-02-20 DIAGNOSIS — M5136 Other intervertebral disc degeneration, lumbar region: Secondary | ICD-10-CM | POA: Diagnosis not present

## 2014-02-20 DIAGNOSIS — W19XXXA Unspecified fall, initial encounter: Secondary | ICD-10-CM | POA: Diagnosis not present

## 2014-02-20 DIAGNOSIS — M5126 Other intervertebral disc displacement, lumbar region: Secondary | ICD-10-CM | POA: Diagnosis not present

## 2014-02-20 DIAGNOSIS — M79651 Pain in right thigh: Secondary | ICD-10-CM

## 2014-02-20 DIAGNOSIS — C7951 Secondary malignant neoplasm of bone: Secondary | ICD-10-CM | POA: Diagnosis not present

## 2014-02-20 DIAGNOSIS — S79921A Unspecified injury of right thigh, initial encounter: Secondary | ICD-10-CM | POA: Diagnosis not present

## 2014-02-20 MED ORDER — GADOBENATE DIMEGLUMINE 529 MG/ML IV SOLN: 15.0000 mL | Freq: Once | INTRAVENOUS | Status: AC | PRN

## 2014-02-23 ENCOUNTER — Encounter: Payer: Self-pay | Admitting: Hematology & Oncology

## 2014-03-12 ENCOUNTER — Other Ambulatory Visit: Payer: Self-pay | Admitting: Hematology & Oncology

## 2014-03-12 ENCOUNTER — Ambulatory Visit: Payer: Medicare Other

## 2014-03-12 ENCOUNTER — Ambulatory Visit (HOSPITAL_BASED_OUTPATIENT_CLINIC_OR_DEPARTMENT_OTHER): Payer: Medicare Other | Admitting: Hematology & Oncology

## 2014-03-12 ENCOUNTER — Other Ambulatory Visit (HOSPITAL_BASED_OUTPATIENT_CLINIC_OR_DEPARTMENT_OTHER): Payer: Medicare Other | Admitting: Lab

## 2014-03-12 ENCOUNTER — Other Ambulatory Visit: Payer: Self-pay | Admitting: *Deleted

## 2014-03-12 ENCOUNTER — Encounter: Payer: Self-pay | Admitting: Hematology & Oncology

## 2014-03-12 VITALS — BP 129/74 | HR 82 | Temp 97.7°F | Resp 18 | Ht 67.0 in | Wt 161.0 lb

## 2014-03-12 DIAGNOSIS — C61 Malignant neoplasm of prostate: Secondary | ICD-10-CM

## 2014-03-12 DIAGNOSIS — E119 Type 2 diabetes mellitus without complications: Secondary | ICD-10-CM

## 2014-03-12 DIAGNOSIS — Z5111 Encounter for antineoplastic chemotherapy: Secondary | ICD-10-CM | POA: Diagnosis not present

## 2014-03-12 DIAGNOSIS — C7951 Secondary malignant neoplasm of bone: Secondary | ICD-10-CM

## 2014-03-12 DIAGNOSIS — M79651 Pain in right thigh: Secondary | ICD-10-CM

## 2014-03-12 LAB — CBC WITH DIFFERENTIAL (CANCER CENTER ONLY)
BASO#: 0 10*3/uL (ref 0.0–0.2)
BASO%: 0.6 % (ref 0.0–2.0)
EOS%: 2.2 % (ref 0.0–7.0)
Eosinophils Absolute: 0.1 10*3/uL (ref 0.0–0.5)
HCT: 42.6 % (ref 38.7–49.9)
HGB: 13.9 g/dL (ref 13.0–17.1)
LYMPH#: 1.7 10*3/uL (ref 0.9–3.3)
LYMPH%: 27 % (ref 14.0–48.0)
MCH: 31.1 pg (ref 28.0–33.4)
MCHC: 32.6 g/dL (ref 32.0–35.9)
MCV: 95 fL (ref 82–98)
MONO#: 0.4 10*3/uL (ref 0.1–0.9)
MONO%: 5.8 % (ref 0.0–13.0)
NEUT#: 4.1 10*3/uL (ref 1.5–6.5)
NEUT%: 64.4 % (ref 40.0–80.0)
Platelets: 231 10*3/uL (ref 145–400)
RBC: 4.47 10*6/uL (ref 4.20–5.70)
RDW: 12.5 % (ref 11.1–15.7)
WBC: 6.4 10*3/uL (ref 4.0–10.0)

## 2014-03-12 LAB — COMPREHENSIVE METABOLIC PANEL (CC13)
ALT: 9 U/L (ref 0–55)
AST: 15 U/L (ref 5–34)
Albumin: 3.8 g/dL (ref 3.5–5.0)
Alkaline Phosphatase: 74 U/L (ref 40–150)
Anion Gap: 10 mEq/L (ref 3–11)
BUN: 18 mg/dL (ref 7.0–26.0)
CO2: 26 mEq/L (ref 22–29)
Calcium: 9.5 mg/dL (ref 8.4–10.4)
Chloride: 104 mEq/L (ref 98–109)
Creatinine: 1.1 mg/dL (ref 0.7–1.3)
EGFR: 73 mL/min/{1.73_m2} — ABNORMAL LOW (ref >90)
Glucose: 152 mg/dl — ABNORMAL HIGH (ref 70–140)
Potassium: 3.9 mEq/L (ref 3.5–5.1)
Sodium: 140 mEq/L (ref 136–145)
Total Bilirubin: 0.37 mg/dL (ref 0.20–1.20)
Total Protein: 6.7 g/dL (ref 6.4–8.3)

## 2014-03-12 MED ORDER — TRAMADOL HCL 50 MG PO TABS: 50.0000 mg | ORAL_TABLET | Freq: Four times a day (QID) | ORAL | Status: DC | PRN

## 2014-03-12 MED ORDER — LEUPROLIDE ACETATE (3 MONTH) 22.5 MG IM KIT
22.5000 mg | PACK | Freq: Once | INTRAMUSCULAR | Status: AC
  Administered 2014-03-12: 22.5 mg via INTRAMUSCULAR
  Filled 2014-03-12: qty 22.5

## 2014-03-12 MED ORDER — DENOSUMAB 120 MG/1.7ML ~~LOC~~ SOLN
120.0000 mg | Freq: Once | SUBCUTANEOUS | Status: AC
  Administered 2014-03-12: 120 mg via SUBCUTANEOUS
  Filled 2014-03-12: qty 1.7

## 2014-03-12 NOTE — Progress Notes (Signed)
Hematology and Oncology Follow Up Visit  [name] [id number] May 12, 1945 69 y.o. 03/12/2014   Principle Diagnosis:   Metastatic castrate resistant prostate cancer  Current Therapy:    Zytiga 1000 mg by mouth daily  Xgeva 120 mg subcutaneous Q3 months - due today  Lupron 22 mg IM every 3 months - due today     Interim History:  [name] is back for followup. He is doing a little better. He was here a couple weeks ago. He is having a lot of pain in his back and right leg. We did MRIs. He had a Doppler of his right leg. This was negative for any thromboembolic disease.  The MRIs that were done did not show anything obvious with respect to his prostate cancer. 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 He had some narrowing at L5-S1 that could affect the right S1 nerve root. 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  He had a MRI of the right femur. This did not show any obvious metastasis. There was no issues with his thigh muscles.  He says the pain is pretty much gone now.  He has not had a bone scan since July of last year. I think it would be helpful to get a bone scan on him.  His PSA has been trending up very slowly. His last PSA was 0.33. Prior to this, the PSA was 0.26  He's been on setting up probably for about 10 months. It is sort of possible that we might be losing some effectiveness.  I think that is the bone scan shows that this sclerotic met is increasing, we might just consider radiation therapy to this area. Another possibility would be radioisotope therapy with Xifigo.  He is still working. He is eating well. He is watch his blood sugars. He does have some diabetes. This might be from the prednisone that he takes with [name].'   Medications:  Current outpatient prescriptions:  .  Calcium Carbonate-Vitamin D (CALCIUM + D PO), Take 500 mg by mouth 2 (two) times daily., Disp: , Rfl:  .  cyclobenzaprine (FLEXERIL) 10 MG tablet,  Take 1 tablet (10 mg total) by mouth 2 (two) times daily as needed for muscle spasms., Disp: 20 tablet, Rfl: 0 .  gabapentin (NEURONTIN) 100 MG capsule, TAKE 1 CAPSULE BY MOUTH EVERY 8 HOURS, Disp: 90 capsule, Rfl: 6 .  glimepiride (AMARYL) 1 MG tablet, TAKE TWO TABLETS BY MOUTH DAILY WITH BREAKFAST, Disp: 60 tablet, Rfl: 4 .  metFORMIN (GLUCOPHAGE XR) 500 MG 24 hr tablet, Take 1 tablet (500 mg total) by mouth daily with breakfast., Disp: 60 tablet, Rfl: 4 .  Multiple Minerals-Vitamins (CALCIUM & VIT D3 BONE HEALTH PO), Take by mouth every morning. , Disp: , Rfl:  .  omeprazole (PRILOSEC) 20 MG capsule, Take 20 mg by mouth daily. , Disp: , Rfl:  .  OVER THE COUNTER MEDICATION, every morning. Juice Plus, Disp: , Rfl:  .  polyethylene glycol powder (MIRALAX) powder, Take 17 g by mouth daily., Disp: , Rfl:  .  predniSONE (DELTASONE) 5 MG tablet, TAKE 1 TABLET BY MOUTH ONCE DAILY, Disp: 60 tablet, Rfl: 0 .  solifenacin (VESICARE) 5 MG tablet, 3 times a week, Disp: , Rfl:  .  traZODone (DESYREL) 50 MG tablet, bedtime as needed, Disp: , Rfl:  .  traMADol (ULTRAM) 50 MG tablet, Take 1 tablet (50 mg total) by mouth every 6 (six) hours as needed., Disp: 90  tablet, Rfl: 2 .  ZYTIGA 250 MG tablet, TAKE 4 TABLETS (1,000 MG TOTAL) BY MOUTH DAILY. TAKE ON AN EMPTY STOMACH 1 HOUR BEFORE OR 2 HOURS AFTER A MEAL, Disp: 120 tablet, Rfl: 0  Allergies:  Allergies  Allergen Reactions  . Codeine Nausea And Vomiting  . Hydrocodone Nausea And Vomiting    Past Medical History, Surgical history, Social history, and Family History were reviewed and updated.  Review of Systems: As above  Physical Exam:  height is $RemoveB'5\' 7"'aanoWhYG$  (1.702 m) and weight is 161 lb (73.029 kg). His oral temperature is 97.7 F (36.5 C). His blood pressure is 129/74 and his pulse is 82. His respiration is 18.   Well-developed and well-nourished white male. Head and neck exam shows no ocular or oral lesions. The are no palpable cervical or  supraclavicular lymph nodes. Lungs are clear with no rales, wheezes or rhonchi. Cardiac exam regular rate and rhythm with no murmurs rubs or bruits. Abdomen is soft. He has good bowel sounds. There is no fluid wave. There is no palpable liver or spleen tip. Back exam shows no tenderness over the spine ribs or hips. I cannot elicit any tenderness over the sacrum bilaterally. He has good range of motion of his pelvis. Extremities shows no clubbing, cyanosis or edema. Skin exam no rashes, ecchymoses or petechia. Neurological exam is nonfocal.   Lab Results  Component Value Date   WBC 6.4 03/12/2014   HGB 13.9 03/12/2014   HCT 42.6 03/12/2014   MCV 95 03/12/2014   PLT 231 03/12/2014     Chemistry      Component Value Date/Time   NA 140 03/12/2014 0820   NA 142 02/15/2014 0948   NA 139 08/07/2013 0940   K 3.9 03/12/2014 0820   K 3.6 02/15/2014 0948   K 4.5 08/07/2013 0940   CL 100 02/15/2014 0948   CL 103 08/07/2013 0940   CO2 26 03/12/2014 0820   CO2 28 02/15/2014 0948   CO2 27 08/07/2013 0940   BUN 18.0 03/12/2014 0820   BUN 10 02/15/2014 0948   BUN 17 08/07/2013 0940   CREATININE 1.1 03/12/2014 0820   CREATININE 0.9 02/15/2014 0948   CREATININE 1.06 08/07/2013 0940      Component Value Date/Time   CALCIUM 9.5 03/12/2014 0820   CALCIUM 9.8 02/15/2014 0948   CALCIUM 9.8 08/07/2013 0940   ALKPHOS 74 03/12/2014 0820   ALKPHOS 54 02/15/2014 0948   ALKPHOS 45 08/07/2013 0940   AST 15 03/12/2014 0820   AST 19 02/15/2014 0948   AST 16 08/07/2013 0940   ALT 9 03/12/2014 0820   ALT 17 02/15/2014 0948   ALT 17 08/07/2013 0940   BILITOT 0.37 03/12/2014 0820   BILITOT 0.90 02/15/2014 0948   BILITOT 0.4 08/07/2013 0940         Impression and Plan: [name] is 69 year old gentleman with metastatic prostate cancer. I think the bone scan will be helpful for [place]. Again, his last bone scan was done back in July 2015. I think we can look at the bone scan and see if he has  progression of his disease. We will see what his PSA is.  I spent about 35 minutes with he and his wife. I explained my recommendations and plan if we find that he has "active" disease. Again, I think if he has only activity in this sacral lesion, then radiation therapy would be appropriate. I would not change his systemic therapy.  I will  plan to get him back to see [place] in another 4 weeks.  He did get his Lupron and Xgeva today.    [name], MD 2/26/20162:53 PM

## 2014-03-12 NOTE — Patient Instructions (Addendum)
Denosumab injection What is this medicine? DENOSUMAB (den oh sue mab) slows bone breakdown. Prolia is used to treat osteoporosis in women after menopause and in men. Xgeva is used to prevent bone fractures and other bone problems caused by cancer bone metastases. Xgeva is also used to treat giant cell tumor of the bone. This medicine may be used for other purposes; ask your health care provider or pharmacist if you have questions. COMMON BRAND NAME(S): Prolia, XGEVA What should I tell my health care provider before I take this medicine? They need to know if you have any of these conditions: -dental disease -eczema -infection or history of infections -kidney disease or on dialysis -low blood calcium or vitamin D -malabsorption syndrome -scheduled to have surgery or tooth extraction -taking medicine that contains denosumab -thyroid or parathyroid disease -an unusual reaction to denosumab, other medicines, foods, dyes, or preservatives -pregnant or trying to get pregnant -breast-feeding How should I use this medicine? This medicine is for injection under the skin. It is given by a health care professional in a hospital or clinic setting. If you are getting Prolia, a special MedGuide will be given to you by the pharmacist with each prescription and refill. Be sure to read this information carefully each time. For Prolia, talk to your pediatrician regarding the use of this medicine in children. Special care may be needed. For Xgeva, talk to your pediatrician regarding the use of this medicine in children. While this drug may be prescribed for children as young as 13 years for selected conditions, precautions do apply. Overdosage: If you think you've taken too much of this medicine contact a poison control center or emergency room at once. Overdosage: If you think you have taken too much of this medicine contact a poison control center or emergency room at once. NOTE: This medicine is only for  you. Do not share this medicine with others. What if I miss a dose? It is important not to miss your dose. Call your doctor or health care professional if you are unable to keep an appointment. What may interact with this medicine? Do not take this medicine with any of the following medications: -other medicines containing denosumab This medicine may also interact with the following medications: -medicines that suppress the immune system -medicines that treat cancer -steroid medicines like prednisone or cortisone This list may not describe all possible interactions. Give your health care provider a list of all the medicines, herbs, non-prescription drugs, or dietary supplements you use. Also tell them if you smoke, drink alcohol, or use illegal drugs. Some items may interact with your medicine. What should I watch for while using this medicine? Visit your doctor or health care professional for regular checks on your progress. Your doctor or health care professional may order blood tests and other tests to see how you are doing. Call your doctor or health care professional if you get a cold or other infection while receiving this medicine. Do not treat yourself. This medicine may decrease your body's ability to fight infection. You should make sure you get enough calcium and vitamin D while you are taking this medicine, unless your doctor tells you not to. Discuss the foods you eat and the vitamins you take with your health care professional. See your dentist regularly. Brush and floss your teeth as directed. Before you have any dental work done, tell your dentist you are receiving this medicine. Do not become pregnant while taking this medicine or for 5 months after stopping   it. Women should inform their doctor if they wish to become pregnant or think they might be pregnant. There is a potential for serious side effects to an unborn child. Talk to your health care professional or pharmacist for more  information. What side effects may I notice from receiving this medicine? Side effects that you should report to your doctor or health care professional as soon as possible: -allergic reactions like skin rash, itching or hives, swelling of the face, lips, or tongue -breathing problems -chest pain -fast, irregular heartbeat -feeling faint or lightheaded, falls -fever, chills, or any other sign of infection -muscle spasms, tightening, or twitches -numbness or tingling -skin blisters or bumps, or is dry, peels, or red -slow healing or unexplained pain in the mouth or jaw -unusual bleeding or bruising Side effects that usually do not require medical attention (Report these to your doctor or health care professional if they continue or are bothersome.): -muscle pain -stomach upset, gas This list may not describe all possible side effects. Call your doctor for medical advice about side effects. You may report side effects to FDA at 1-800-FDA-1088. Where should I keep my medicine? This medicine is only given in a clinic, doctor's office, or other health care setting and will not be stored at home. NOTE: This sheet is a summary. It may not cover all possible information. If you have questions about this medicine, talk to your doctor, pharmacist, or health care provider.  2015, Elsevier/Gold Standard. (2011-07-02 12:37:47) Leuprolide depot injection or implant What is this medicine? LEUPROLIDE (loo PROE lide) is a man-made protein that acts like a natural hormone in the body. It decreases testosterone in men and decreases estrogen in women. In men, this medicine is used to treat advanced prostate cancer. In women, some forms of this medicine may be used to treat endometriosis, uterine fibroids, or other male hormone-related problems. This medicine may be used for other purposes; ask your health care provider or pharmacist if you have questions. COMMON BRAND NAME(S): Eligard, Lupron Depot, Lupron  Depot-Ped, Viadur What should I tell my health care provider before I take this medicine? They need to know if you have any of these conditions: -diabetes -heart disease or previous heart attack -high blood pressure -high cholesterol -osteoporosis -pain or difficulty passing urine -spinal cord metastasis -stroke -tobacco smoker -unusual vaginal bleeding (women) -an unusual or allergic reaction to leuprolide, benzyl alcohol, other medicines, foods, dyes, or preservatives -pregnant or trying to get pregnant -breast-feeding How should I use this medicine? This medicine is for injection into a muscle or for implant or injection under the skin. It is given by a health care professional in a hospital or clinic setting. The specific product will determine how it will be given to you. Make sure you understand which product you receive and how often you will receive it. Talk to your pediatrician regarding the use of this medicine in children. Special care may be needed. Overdosage: If you think you have taken too much of this medicine contact a poison control center or emergency room at once. NOTE: This medicine is only for you. Do not share this medicine with others. What if I miss a dose? It is important not to miss a dose. Call your doctor or health care professional if you are unable to keep an appointment. Depot injections: Depot injections are given either once-monthly, every 12 weeks, every 16 weeks, or every 24 weeks depending on the product you are prescribed. The product you are prescribed  will be based on if you are male or male, and your condition. Make sure you understand your product and dosing. Implant dosing: The implant is removed and replaced once a year. The implant is only used in males. What may interact with this medicine? Do not take this medicine with any of the following medications: -chasteberry This medicine may also interact with the following medications: -herbal or  dietary supplements, like black cohosh or DHEA -male hormones, like estrogens or progestins and birth control pills, patches, rings, or injections -male hormones, like testosterone This list may not describe all possible interactions. Give your health care provider a list of all the medicines, herbs, non-prescription drugs, or dietary supplements you use. Also tell them if you smoke, drink alcohol, or use illegal drugs. Some items may interact with your medicine. What should I watch for while using this medicine? Visit your doctor or health care professional for regular checks on your progress. During the first weeks of treatment, your symptoms may get worse, but then will improve as you continue your treatment. You may get hot flashes, increased bone pain, increased difficulty passing urine, or an aggravation of nerve symptoms. Discuss these effects with your doctor or health care professional, some of them may improve with continued use of this medicine. Male patients may experience a menstrual cycle or spotting during the first months of therapy with this medicine. If this continues, contact your doctor or health care professional. What side effects may I notice from receiving this medicine? Side effects that you should report to your doctor or health care professional as soon as possible: -allergic reactions like skin rash, itching or hives, swelling of the face, lips, or tongue -breathing problems -chest pain -depression or memory disorders -pain in your legs or groin -pain at site where injected or implanted -severe headache -swelling of the feet and legs -visual changes -vomiting Side effects that usually do not require medical attention (report to your doctor or health care professional if they continue or are bothersome): -breast swelling or tenderness -decrease in sex drive or performance -diarrhea -hot flashes -loss of appetite -muscle, joint, or bone pains -nausea -redness  or irritation at site where injected or implanted -skin problems or acne This list may not describe all possible side effects. Call your doctor for medical advice about side effects. You may report side effects to FDA at 1-800-FDA-1088. Where should I keep my medicine? This drug is given in a hospital or clinic and will not be stored at home. NOTE: This sheet is a summary. It may not cover all possible information. If you have questions about this medicine, talk to your doctor, pharmacist, or health care provider.  2015, Elsevier/Gold Standard. (2009-07-05 14:41:21)

## 2014-03-15 ENCOUNTER — Telehealth: Payer: Self-pay | Admitting: *Deleted

## 2014-03-15 NOTE — Telephone Encounter (Addendum)
Patient aware of results.  ----- Message from [name], MD sent at 03/12/2014  4:20 PM EST ----- Call - blood sugar is 152.  This is ok for now!!!  pete

## 2014-03-17 ENCOUNTER — Encounter: Payer: Self-pay | Admitting: Hematology & Oncology

## 2014-03-19 DIAGNOSIS — H524 Presbyopia: Secondary | ICD-10-CM | POA: Diagnosis not present

## 2014-03-19 DIAGNOSIS — H43813 Vitreous degeneration, bilateral: Secondary | ICD-10-CM | POA: Diagnosis not present

## 2014-03-19 DIAGNOSIS — H43313 Vitreous membranes and strands, bilateral: Secondary | ICD-10-CM | POA: Diagnosis not present

## 2014-03-19 DIAGNOSIS — H52223 Regular astigmatism, bilateral: Secondary | ICD-10-CM | POA: Diagnosis not present

## 2014-03-19 DIAGNOSIS — H5203 Hypermetropia, bilateral: Secondary | ICD-10-CM | POA: Diagnosis not present

## 2014-03-24 ENCOUNTER — Encounter (HOSPITAL_COMMUNITY)
Admission: RE | Admit: 2014-03-24 | Discharge: 2014-03-24 | Disposition: A | Discharge status: Home / Self Care | Payer: Medicare Other | Source: Ambulatory Visit | Attending: Hematology & Oncology | Admitting: Hematology & Oncology

## 2014-03-24 DIAGNOSIS — C61 Malignant neoplasm of prostate: Secondary | ICD-10-CM

## 2014-03-24 MED ORDER — TECHNETIUM TC 99M MEDRONATE IV KIT
26.0000 | PACK | Freq: Once | INTRAVENOUS | Status: AC | PRN
  Administered 2014-03-24: 26 via INTRAVENOUS

## 2014-04-07 ENCOUNTER — Other Ambulatory Visit: Payer: Self-pay | Admitting: *Deleted

## 2014-04-07 DIAGNOSIS — C61 Malignant neoplasm of prostate: Secondary | ICD-10-CM

## 2014-04-08 ENCOUNTER — Other Ambulatory Visit (HOSPITAL_BASED_OUTPATIENT_CLINIC_OR_DEPARTMENT_OTHER): Payer: Medicare Other

## 2014-04-08 ENCOUNTER — Other Ambulatory Visit: Payer: Self-pay | Admitting: Hematology & Oncology

## 2014-04-08 ENCOUNTER — Encounter: Payer: Self-pay | Admitting: Hematology & Oncology

## 2014-04-08 ENCOUNTER — Ambulatory Visit (HOSPITAL_BASED_OUTPATIENT_CLINIC_OR_DEPARTMENT_OTHER): Payer: Medicare Other | Admitting: Hematology & Oncology

## 2014-04-08 VITALS — BP 141/81 | HR 82 | Temp 98.5°F | Resp 18 | Ht 67.0 in | Wt 160.0 lb

## 2014-04-08 DIAGNOSIS — C61 Malignant neoplasm of prostate: Secondary | ICD-10-CM | POA: Diagnosis not present

## 2014-04-08 DIAGNOSIS — C800 Disseminated malignant neoplasm, unspecified: Secondary | ICD-10-CM | POA: Diagnosis not present

## 2014-04-08 LAB — CBC WITH DIFFERENTIAL (CANCER CENTER ONLY)
BASO#: 0 10*3/uL (ref 0.0–0.2)
BASO%: 0.4 % (ref 0.0–2.0)
EOS%: 0.5 % (ref 0.0–7.0)
Eosinophils Absolute: 0 10*3/uL (ref 0.0–0.5)
HCT: 41.2 % (ref 38.7–49.9)
HGB: 13.5 g/dL (ref 13.0–17.1)
LYMPH#: 1.4 10*3/uL (ref 0.9–3.3)
LYMPH%: 18.1 % (ref 14.0–48.0)
MCH: 31 pg (ref 28.0–33.4)
MCHC: 32.8 g/dL (ref 32.0–35.9)
MCV: 95 fL (ref 82–98)
MONO#: 0.4 10*3/uL (ref 0.1–0.9)
MONO%: 5 % (ref 0.0–13.0)
NEUT#: 5.9 10*3/uL (ref 1.5–6.5)
NEUT%: 76 % (ref 40.0–80.0)
Platelets: 232 10*3/uL (ref 145–400)
RBC: 4.35 10*6/uL (ref 4.20–5.70)
RDW: 12.6 % (ref 11.1–15.7)
WBC: 7.8 10*3/uL (ref 4.0–10.0)

## 2014-04-08 NOTE — Progress Notes (Signed)
Histology and Location of Primary Cancer: Metastatic castrate resistant prostate cancer  Location(s) of Symptomatic tumor(s): left hip, lower back,sacral lesion   Past/Anticipated chemotherapy by medical oncology, if any: Zytiga 1000 mg by mouth daily. Xgeva 120 mg subcutaneous Q3 months.  Lupron 22 mg IM every 3 months/  Patient's main complaints related to symptomatic tumor(s) are: patient reports with sitting or with a lot of walking, his lower back and left hip are stiff. He has trouble straightening up.  Pain on a scale of 0-10 is: none, occasional left knee pain wakes him up, gets stiff when bends down to pick up something Ambulatory status? Walker? Wheelchair?: Amulatory  SAFETY ISSUES:  Prior radiation? no  Pacemaker/ICD? no  Possible current pregnancy? no  Is the patient on methotrexate? no  Additional Complaints / other details: Patient is here with his wife. 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

## 2014-04-08 NOTE — Progress Notes (Signed)
Hematology and Oncology Follow Up Visit  [name] [id number] July 17, 1945 69 y.o. 04/08/2014   Principle Diagnosis:   Metastatic castrate resistant prostate cancer  Current Therapy:    Zytiga 1000 mg by mouth daily  Xgeva 120 mg subcutaneous Q3 months - due today  Lupron 22 mg IM every 3 months - due today     Interim History:  [name] is back for followup. He is doing a little better.  I am really worried about some symptoms that he is having. He says that when he walks, he almost passes out. He has some chest discomfort. He does not have diaphoresis. He is not having nausea or vomiting. He says this lasts about 2 or 3 minutes.  I think that he clearly needs a cardiac evaluation. He is definitely at a higher risk of coronary artery disease by being castrate with low testosterone levels. He has seen a cardiologist in [place]. We will have to call them to make a referral. I think upon needs a stress test, Holter monitor and possibly even a cardiac cath.  We went ahead and got a bone scan on him. This, unfortunately, showed that he does have some increased activity in the left sacral. There also is some activity in the left hip joint area did  I think we need to refer him over to radiation oncology. I spoken to Dr. Sondra Come.  . His last PSA was 0.33. For him, this is on the higher side.  He continues on the [place]. He seems to be doing pretty well with this.  He's trying to watch what he eats. He does have some steroid-induced hyperglycemia. He is on metformin and Amaryl. Marland Kitchen  He is still working. He is doing well with work. He does not get chest pain. He does not have palpitations.  He's had no leg swelling. He's had no rashes.  His performance status is ECOG 1.'   Medications:  Current outpatient prescriptions:  .  Calcium Carbonate-Vitamin D (CALCIUM + D PO), Take 500 mg by mouth 2 (two) times daily., Disp: , Rfl:  .  gabapentin (NEURONTIN) 100 MG capsule, TAKE 1  CAPSULE BY MOUTH EVERY 8 HOURS, Disp: 90 capsule, Rfl: 6 .  glimepiride (AMARYL) 1 MG tablet, TAKE TWO TABLETS BY MOUTH DAILY WITH BREAKFAST, Disp: 60 tablet, Rfl: 4 .  metFORMIN (GLUCOPHAGE XR) 500 MG 24 hr tablet, Take 1 tablet (500 mg total) by mouth daily with breakfast., Disp: 60 tablet, Rfl: 4 .  Multiple Minerals-Vitamins (CALCIUM & VIT D3 BONE HEALTH PO), Take by mouth every morning. , Disp: , Rfl:  .  omeprazole (PRILOSEC) 20 MG capsule, Take 20 mg by mouth daily. , Disp: , Rfl:  .  OVER THE COUNTER MEDICATION, every morning. Juice Plus, Disp: , Rfl:  .  polyethylene glycol powder (MIRALAX) powder, Take 17 g by mouth daily., Disp: , Rfl:  .  predniSONE (DELTASONE) 5 MG tablet, TAKE 1 TABLET BY MOUTH ONCE DAILY, Disp: 60 tablet, Rfl: 0 .  solifenacin (VESICARE) 5 MG tablet, HOW TAKES WEEKLY ONLY, Disp: , Rfl:  .  traZODone (DESYREL) 50 MG tablet, bedtime as needed, Disp: , Rfl:  .  ZYTIGA 250 MG tablet, TAKE 4 TABLETS (1,000 MG TOTAL) BY MOUTH DAILY. TAKE ON AN EMPTY STOMACH 1 HOUR BEFORE OR 2 HOURS AFTER A MEAL, Disp: 120 tablet, Rfl: 0  Allergies:  Allergies  Allergen Reactions  . Codeine Nausea And Vomiting  . Hydrocodone Nausea And Vomiting  Past Medical History, Surgical history, Social history, and Family History were reviewed and updated.  Review of Systems: As above  Physical Exam:  height is 5\' 7"  (1.702 m) and weight is 160 lb (72.576 kg). His oral temperature is 98.5 F (36.9 C). His blood pressure is 141/81 and his pulse is 82. His respiration is 18.   Well-developed and well-nourished white male. Head and neck exam shows no ocular or oral lesions. The are no palpable cervical or supraclavicular lymph nodes. Lungs are clear with no rales, wheezes or rhonchi. Cardiac exam regular rate and rhythm with no murmurs rubs or bruits. Abdomen is soft. He has good bowel sounds. There is no fluid wave. There is no palpable liver or spleen tip. Back exam shows no tenderness over  the spine ribs or hips. I cannot elicit any tenderness over the sacrum bilaterally. He has good range of motion of his pelvis. Extremities shows no clubbing, cyanosis or edema. Skin exam no rashes, ecchymoses or petechia. Neurological exam is nonfocal.   Lab Results  Component Value Date   WBC 7.8 04/08/2014   HGB 13.5 04/08/2014   HCT 41.2 04/08/2014   MCV 95 04/08/2014   PLT 232 04/08/2014     Chemistry      Component Value Date/Time   NA 140 03/12/2014 0820   NA 142 02/15/2014 0948   NA 139 08/07/2013 0940   K 3.9 03/12/2014 0820   K 3.6 02/15/2014 0948   K 4.5 08/07/2013 0940   CL 100 02/15/2014 0948   CL 103 08/07/2013 0940   CO2 26 03/12/2014 0820   CO2 28 02/15/2014 0948   CO2 27 08/07/2013 0940   BUN 18.0 03/12/2014 0820   BUN 10 02/15/2014 0948   BUN 17 08/07/2013 0940   CREATININE 1.1 03/12/2014 0820   CREATININE 0.9 02/15/2014 0948   CREATININE 1.06 08/07/2013 0940      Component Value Date/Time   CALCIUM 9.5 03/12/2014 0820   CALCIUM 9.8 02/15/2014 0948   CALCIUM 9.8 08/07/2013 0940   ALKPHOS 74 03/12/2014 0820   ALKPHOS 54 02/15/2014 0948   ALKPHOS 45 08/07/2013 0940   AST 15 03/12/2014 0820   AST 19 02/15/2014 0948   AST 16 08/07/2013 0940   ALT 9 03/12/2014 0820   ALT 17 02/15/2014 0948   ALT 17 08/07/2013 0940   BILITOT 0.37 03/12/2014 0820   BILITOT 0.90 02/15/2014 0948   BILITOT 0.4 08/07/2013 0940         Impression and Plan: [name] is 69 year old gentleman with metastatic prostate cancer. By the bone scan, he is progressing. He is on Zytiga. I plywood continue him on the Zytiga for now area did XTANDI would be the next choice for [place].  Again, I think he needs a cardiac workup. I think this will be very helpful.  He will be due for his injections but we see him back.  I spent about 35 minutes with he and his sister. His wife could not make it today.   [name], MD 3/24/20162:30 PM

## 2014-04-09 ENCOUNTER — Telehealth: Payer: Self-pay | Admitting: *Deleted

## 2014-04-09 LAB — COMPREHENSIVE METABOLIC PANEL
ALT: 12 U/L (ref 0–53)
AST: 15 U/L (ref 0–37)
Albumin: 4.2 g/dL (ref 3.5–5.2)
Alkaline Phosphatase: 57 U/L (ref 39–117)
BUN: 12 mg/dL (ref 6–23)
CO2: 27 mEq/L (ref 19–32)
Calcium: 9.4 mg/dL (ref 8.4–10.5)
Chloride: 100 mEq/L (ref 96–112)
Creatinine, Ser: 0.96 mg/dL (ref 0.50–1.35)
Glucose, Bld: 129 mg/dL — ABNORMAL HIGH (ref 70–99)
Potassium: 4.1 mEq/L (ref 3.5–5.3)
Sodium: 137 mEq/L (ref 135–145)
Total Bilirubin: 0.5 mg/dL (ref 0.2–1.2)
Total Protein: 6.5 g/dL (ref 6.0–8.3)

## 2014-04-09 LAB — URIC ACID: Uric Acid, Serum: 3.7 mg/dL — ABNORMAL LOW (ref 4.0–7.8)

## 2014-04-09 LAB — PSA: PSA: 0.33 ng/mL (ref <=4.00)

## 2014-04-09 NOTE — Telephone Encounter (Addendum)
Patient aware  ----- Message from [name], MD sent at 04/09/2014  1:00 PM EDT ----- Call - PSA is stable.  pete

## 2014-04-12 ENCOUNTER — Ambulatory Visit
Admission: RE | Admit: 2014-04-12 | Discharge: 2014-04-12 | Disposition: A | Discharge status: Home / Self Care | Payer: Medicare Other | Source: Ambulatory Visit

## 2014-04-12 ENCOUNTER — Ambulatory Visit
Admission: RE | Admit: 2014-04-12 | Discharge: 2014-04-12 | Disposition: A | Discharge status: Home / Self Care | Payer: Medicare Other | Source: Ambulatory Visit | Attending: Radiation Oncology | Admitting: Radiation Oncology

## 2014-04-12 ENCOUNTER — Encounter: Payer: Self-pay | Admitting: Radiation Oncology

## 2014-04-12 VITALS — BP 133/77 | HR 80 | Temp 98.1°F | Resp 20 | Ht 68.0 in | Wt 160.8 lb

## 2014-04-12 DIAGNOSIS — C61 Malignant neoplasm of prostate: Secondary | ICD-10-CM | POA: Diagnosis not present

## 2014-04-12 DIAGNOSIS — C7951 Secondary malignant neoplasm of bone: Secondary | ICD-10-CM

## 2014-04-12 NOTE — Progress Notes (Signed)
Radiation Oncology         (336) [phone] ________________________________  Name: [name] MRN: [id number]  Date: 04/12/2014  DOB: January 22, 1945  Follow-Up Visit Note  CC: Nicoletta Dress, MD  [name], MD    ICD-9-CM ICD-10-CM   1. 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 Prostate cancer 185 C61     Diagnosis:   Metastatic prostate cancer    Narrative:  The patient returns today for further evaluation at the courtesy of Dr. Burney Gauze. Patient has continued on                    Zytiga 1000 mg by mouth daily  Xgeva 120 mg subcutaneous Q3 months             Lupron 22 mg IM every 3 months       He is tolerating this therapy well. Patient recently developed some pain along the right upper leg area. He underwent plain x-rays and a MRI which did not show any evidence of metastasis to this region. The patient proceeded to undergo a repeat bone scan which showed more activity in the right sacroiliac joint area. Patient was also noted to have mild uptake in the left greater trochanter area of the femur. Patient denies any further pain in the right leg area. He denies any significant pain in the left leg. He has mild discomfort in his low back when bending over. He denies any numbness or tingling in his lower extremities or focal motor weakness. He continues to work several days a week.     ALLERGIES:  is allergic to codeine and hydrocodone.  Meds: Current Outpatient Prescriptions  Medication Sig Dispense Refill  . Calcium Carbonate-Vitamin D (CALCIUM + D PO) Take 500 mg by mouth 2 (two) times daily.    Marland Kitchen gabapentin (NEURONTIN) 100 MG capsule TAKE 1 CAPSULE BY MOUTH EVERY 8 HOURS 90 capsule 6  . glimepiride (AMARYL) 1 MG tablet TAKE TWO TABLETS BY MOUTH DAILY WITH BREAKFAST 60 tablet 4  . metFORMIN (GLUCOPHAGE XR) 500 MG 24 hr tablet Take 1 tablet (500 mg total) by mouth daily with breakfast. 60 tablet 4  . Multiple Minerals-Vitamins (CALCIUM & VIT D3  BONE HEALTH PO) Take by mouth every morning.     Marland Kitchen omeprazole (PRILOSEC) 20 MG capsule Take 20 mg by mouth daily.     Marland Kitchen OVER THE COUNTER MEDICATION every morning. Juice Plus    . polyethylene glycol powder (MIRALAX) powder Take 17 g by mouth daily.    . predniSONE (DELTASONE) 5 MG tablet TAKE 1 TABLET BY MOUTH ONCE DAILY 60 tablet 0  . solifenacin (VESICARE) 5 MG tablet HOW TAKES WEEKLY ONLY    . ZYTIGA 250 MG tablet TAKE 4 TABLETS (1,000 MG TOTAL) BY MOUTH DAILY. TAKE ON AN EMPTY STOMACH 1 HOUR BEFORE OR 2 HOURS AFTER A MEAL 120 tablet 0  . traZODone (DESYREL) 50 MG tablet bedtime as needed     No current facility-administered medications for this encounter.    Physical Findings: The patient is in no acute distress. Patient is alert and oriented.  height is 5\' 8"  (1.727 m) and weight is 160 lb 12.8 oz (72.938 kg). His oral temperature is 98.1 F (36.7 C). His blood pressure is 133/77 and his pulse is 80. His respiration is 20 and oxygen saturation is 100%. .  No palpable supraclavicular or axillary adenopathy. The lungs are clear to auscultation. The heart has  a regular rhythm and rate. The abdomen is soft and nontender with normal bowel sounds. Palpation along the lower lumbar spine and sacral area reveals no obvious point tenderness. Palpation along the left proximal femur feels no point tenderness. Motor strength is 5 out of 5 in the proximal and distal muscle groups of the upper lower extremities.  Lab Findings: Lab Results  Component Value Date   WBC 7.8 04/08/2014   HGB 13.5 04/08/2014   HCT 41.2 04/08/2014   MCV 95 04/08/2014   PLT 232 04/08/2014    Radiographic Findings: Nm Bone Scan Whole Body  03/24/2014   CLINICAL DATA:  Metastatic prostate carcinoma, evaluate for progression  EXAM: NUCLEAR MEDICINE WHOLE BODY BONE SCAN  TECHNIQUE: Whole body anterior and posterior images were obtained approximately 3 hours after intravenous injection of radiopharmaceutical.   RADIOPHARMACEUTICALS:  26.0 mCi Technetium-99 MDP  COMPARISON:  Nuclear medicine bone scan of 07/16/2013  FINDINGS: Compared to the prior bone scan, there is slightly more activity at the site of the previously demonstrated right sacral metastatic lesion. In addition, there is faint activity in the region of the left femoral greater trochanter which is not seen previously. This may be due to an inflammatory process, but a metastatic lesion is difficult to exclude. Minimal activity in the thoracic and lumbar spine is most consistent with degenerative change.  IMPRESSION: 1. More activity within the right sacrum at the site of the previously demonstrated metastatic lesion. 2. New faint activity in the region of the left femoral greater trochanter. Possibly inflammatory but cannot exclude metastasis.   Electronically Signed   By: Ivar Drape M.D.   On: 03/24/2014 13:41    Impression:  Metastatic prostate cancer. As above on bone scan the patient is noted to have more activity in the area of concern. Surprisingly the patient does not have significant pain in this region. I would recommend radiation therapy directed to this area given the activity noted on bone scan.  Patient will have scanning through his left femur at the time of his simulation. If There is significant disease in the left greater trochanter region than he would also proceed with radiation treatments to this area.  Plan:  Simulation and planning the next few days. Anticipate 10 radiation treatments.  ____________________________________ Blair Promise, MD

## 2014-04-12 NOTE — Progress Notes (Signed)
Please see the Nurse Progress Note in the MD Initial Consult Encounter for this patient. 

## 2014-04-14 ENCOUNTER — Ambulatory Visit
Admission: RE | Admit: 2014-04-14 | Discharge: 2014-04-14 | Disposition: A | Discharge status: Home / Self Care | Payer: Medicare Other | Source: Ambulatory Visit | Attending: Radiation Oncology | Admitting: Radiation Oncology

## 2014-04-14 ENCOUNTER — Telehealth: Payer: Self-pay | Admitting: Hematology & Oncology

## 2014-04-14 DIAGNOSIS — C61 Malignant neoplasm of prostate: Secondary | ICD-10-CM

## 2014-04-14 DIAGNOSIS — C7951 Secondary malignant neoplasm of bone: Secondary | ICD-10-CM | POA: Diagnosis not present

## 2014-04-14 NOTE — Telephone Encounter (Signed)
Pt came in asking about cariology referral. RN will check with MD and we will call pt. Pt aware to call Friday morning if he doesn't here from [place].

## 2014-04-14 NOTE — Progress Notes (Signed)
  Radiation Oncology         (336) [phone] ________________________________  Name: [name] MRN: [id number]  Date: 04/14/2014  DOB: 01-09-1946  SIMULATION AND TREATMENT PLANNING NOTE    ICD-9-CM ICD-10-CM   1. Prostate cancer 185 C61     DIAGNOSIS:  Metastatic prostate cancer to the right sacrum  NARRATIVE:  The patient was brought to the [place].  Identity was confirmed.  All relevant records and images related to the planned course of therapy were reviewed.  The patient freely provided informed written consent to proceed with treatment after reviewing the details related to the planned course of therapy. The consent form was witnessed and verified by the simulation staff.  Then, the patient was set-up in a stable reproducible  supine position for radiation therapy.  CT images were obtained.  Surface markings were placed.  The CT images were loaded into the planning software.  Then the target and avoidance structures were contoured.  Treatment planning then occurred.  The radiation prescription was entered and confirmed.  Then, I designed and supervised the construction of a total of 5 medically necessary complex treatment devices.  I have requested : Isodose Plan.  I have ordered:dose calc.  PLAN:  The patient will receive 30 Gy in 10 fractions .  ________________________________  -----------------------------------  Blair Promise, PhD, MD

## 2014-04-15 DIAGNOSIS — C7951 Secondary malignant neoplasm of bone: Secondary | ICD-10-CM | POA: Diagnosis not present

## 2014-04-15 DIAGNOSIS — C61 Malignant neoplasm of prostate: Secondary | ICD-10-CM | POA: Diagnosis not present

## 2014-04-16 DIAGNOSIS — C7951 Secondary malignant neoplasm of bone: Secondary | ICD-10-CM | POA: Diagnosis not present

## 2014-04-16 DIAGNOSIS — C61 Malignant neoplasm of prostate: Secondary | ICD-10-CM | POA: Diagnosis not present

## 2014-04-21 ENCOUNTER — Ambulatory Visit
Admission: RE | Admit: 2014-04-21 | Discharge: 2014-04-21 | Disposition: A | Discharge status: Home / Self Care | Payer: Medicare Other | Source: Ambulatory Visit | Attending: Radiation Oncology | Admitting: Radiation Oncology

## 2014-04-21 DIAGNOSIS — C61 Malignant neoplasm of prostate: Secondary | ICD-10-CM | POA: Diagnosis not present

## 2014-04-21 DIAGNOSIS — C7951 Secondary malignant neoplasm of bone: Secondary | ICD-10-CM | POA: Diagnosis not present

## 2014-04-21 NOTE — Progress Notes (Signed)
  Radiation Oncology         [phone]) [phone] ________________________________  Name: [name] MRN: [id number]  Date: 04/21/2014  DOB: December 13, 1945  Simulation Verification Note    ICD-9-CM ICD-10-CM   1. Prostate cancer 185 C61     Status: outpatient  NARRATIVE: The patient was brought to the treatment unit and placed in the planned treatment position. The clinical setup was verified. Then port films were obtained and uploaded to the radiation oncology medical record software.  The treatment beams were carefully compared against the planned radiation fields. The position location and shape of the radiation fields was reviewed. They targeted volume of tissue appears to be appropriately covered by the radiation beams. Organs at risk appear to be excluded as planned.  Based on my personal review, I approved the simulation verification. The patient's treatment will proceed as planned.  -----------------------------------  Blair Promise, PhD, MD

## 2014-04-22 ENCOUNTER — Ambulatory Visit
Admission: RE | Admit: 2014-04-22 | Discharge: 2014-04-22 | Disposition: A | Discharge status: Home / Self Care | Payer: Medicare Other | Source: Ambulatory Visit | Attending: Radiation Oncology | Admitting: Radiation Oncology

## 2014-04-22 DIAGNOSIS — C7951 Secondary malignant neoplasm of bone: Secondary | ICD-10-CM | POA: Diagnosis not present

## 2014-04-22 DIAGNOSIS — C61 Malignant neoplasm of prostate: Secondary | ICD-10-CM | POA: Diagnosis not present

## 2014-04-23 ENCOUNTER — Ambulatory Visit
Admission: RE | Admit: 2014-04-23 | Discharge: 2014-04-23 | Disposition: A | Discharge status: Home / Self Care | Payer: Medicare Other | Source: Ambulatory Visit | Attending: Radiation Oncology | Admitting: Radiation Oncology

## 2014-04-23 DIAGNOSIS — C61 Malignant neoplasm of prostate: Secondary | ICD-10-CM | POA: Diagnosis not present

## 2014-04-23 DIAGNOSIS — C7951 Secondary malignant neoplasm of bone: Secondary | ICD-10-CM | POA: Diagnosis not present

## 2014-04-26 ENCOUNTER — Ambulatory Visit: Admission: RE | Admit: 2014-04-26 | Payer: Medicare Other | Source: Ambulatory Visit

## 2014-04-26 DIAGNOSIS — I10 Essential (primary) hypertension: Secondary | ICD-10-CM | POA: Diagnosis not present

## 2014-04-26 DIAGNOSIS — I2510 Atherosclerotic heart disease of native coronary artery without angina pectoris: Secondary | ICD-10-CM | POA: Diagnosis not present

## 2014-04-27 ENCOUNTER — Ambulatory Visit
Admission: RE | Admit: 2014-04-27 | Discharge: 2014-04-27 | Disposition: A | Discharge status: Home / Self Care | Payer: Medicare Other | Source: Ambulatory Visit | Attending: Radiation Oncology | Admitting: Radiation Oncology

## 2014-04-27 ENCOUNTER — Encounter: Payer: Self-pay | Admitting: Radiation Oncology

## 2014-04-27 VITALS — BP 134/85 | HR 86 | Temp 97.8°F | Resp 16 | Wt 161.0 lb

## 2014-04-27 DIAGNOSIS — C7951 Secondary malignant neoplasm of bone: Secondary | ICD-10-CM | POA: Diagnosis not present

## 2014-04-27 DIAGNOSIS — C61 Malignant neoplasm of prostate: Secondary | ICD-10-CM

## 2014-04-27 NOTE — Progress Notes (Signed)
  Radiation Oncology         [phone] ________________________________  Name: [name] MRN: [id number]  Date: 04/27/2014  DOB: Nov 12, 1945  Weekly Radiation Therapy Management  DIAGNOSIS: Metastatic prostate cancer to the right sacrum  Current Dose: 9 Gy     Planned Dose:  30 Gy  Narrative . . . . . . . . The patient presents for routine under treatment assessment.                                   The patient is without complaint. No nausea or bowel effects                                 Set-up films were reviewed.                                 The chart was checked. Physical Findings. . .  weight is 161 lb (73.029 kg). His oral temperature is 97.8 F (36.6 C). His blood pressure is 134/85 and his pulse is 86. His respiration is 16 and oxygen saturation is 100%. .The lungs are clear. The heart has a regular rhythm and rate.  Impression . . . . . . . The patient is tolerating radiation. Plan . . . . . . . . . . . . Continue treatment as planned.  ________________________________   Blair Promise, PhD, MD

## 2014-04-27 NOTE — Progress Notes (Signed)
Weight and vitals stable. Denies pain. Denies dysuria or hematuria. Denies numbness or tingling of the legs. Denies diarrhea. Patient without complaints.

## 2014-04-28 ENCOUNTER — Ambulatory Visit
Admission: RE | Admit: 2014-04-28 | Discharge: 2014-04-28 | Disposition: A | Discharge status: Home / Self Care | Payer: Medicare Other | Source: Ambulatory Visit | Attending: Radiation Oncology | Admitting: Radiation Oncology

## 2014-04-28 DIAGNOSIS — C7951 Secondary malignant neoplasm of bone: Secondary | ICD-10-CM | POA: Diagnosis not present

## 2014-04-28 DIAGNOSIS — C61 Malignant neoplasm of prostate: Secondary | ICD-10-CM | POA: Diagnosis not present

## 2014-04-29 ENCOUNTER — Ambulatory Visit
Admission: RE | Admit: 2014-04-29 | Discharge: 2014-04-29 | Disposition: A | Discharge status: Home / Self Care | Payer: Medicare Other | Source: Ambulatory Visit | Attending: Radiation Oncology | Admitting: Radiation Oncology

## 2014-04-29 DIAGNOSIS — C61 Malignant neoplasm of prostate: Secondary | ICD-10-CM | POA: Diagnosis not present

## 2014-04-29 DIAGNOSIS — C7951 Secondary malignant neoplasm of bone: Secondary | ICD-10-CM | POA: Diagnosis not present

## 2014-04-30 ENCOUNTER — Ambulatory Visit
Admission: RE | Admit: 2014-04-30 | Discharge: 2014-04-30 | Disposition: A | Discharge status: Home / Self Care | Payer: Medicare Other | Source: Ambulatory Visit | Attending: Radiation Oncology | Admitting: Radiation Oncology

## 2014-04-30 DIAGNOSIS — C61 Malignant neoplasm of prostate: Secondary | ICD-10-CM | POA: Diagnosis not present

## 2014-04-30 DIAGNOSIS — C7951 Secondary malignant neoplasm of bone: Secondary | ICD-10-CM | POA: Diagnosis not present

## 2014-05-03 ENCOUNTER — Ambulatory Visit
Admission: RE | Admit: 2014-05-03 | Discharge: 2014-05-03 | Disposition: A | Discharge status: Home / Self Care | Payer: Medicare Other | Source: Ambulatory Visit | Attending: Radiation Oncology | Admitting: Radiation Oncology

## 2014-05-03 DIAGNOSIS — C7951 Secondary malignant neoplasm of bone: Secondary | ICD-10-CM | POA: Diagnosis not present

## 2014-05-03 DIAGNOSIS — C61 Malignant neoplasm of prostate: Secondary | ICD-10-CM | POA: Diagnosis not present

## 2014-05-04 ENCOUNTER — Encounter: Payer: Self-pay | Admitting: Radiation Oncology

## 2014-05-04 ENCOUNTER — Ambulatory Visit
Admission: RE | Admit: 2014-05-04 | Discharge: 2014-05-04 | Disposition: A | Discharge status: Home / Self Care | Payer: Medicare Other | Source: Ambulatory Visit | Attending: Radiation Oncology | Admitting: Radiation Oncology

## 2014-05-04 ENCOUNTER — Ambulatory Visit
Admission: RE | Admit: 2014-05-04 | Discharge: 2014-05-04 | Disposition: A | Discharge status: Home / Self Care | Payer: BLUE CROSS/BLUE SHIELD | Source: Ambulatory Visit | Attending: Radiation Oncology | Admitting: Radiation Oncology

## 2014-05-04 VITALS — BP 125/70 | HR 89 | Temp 98.4°F | Resp 16 | Ht 68.0 in | Wt 162.6 lb

## 2014-05-04 DIAGNOSIS — C7951 Secondary malignant neoplasm of bone: Secondary | ICD-10-CM | POA: Diagnosis not present

## 2014-05-04 DIAGNOSIS — C61 Malignant neoplasm of prostate: Secondary | ICD-10-CM

## 2014-05-04 NOTE — Progress Notes (Addendum)
[name] has completed 8 fractions to his sacrum.  [name] denies pain, nausea, skin irritation, diarrhea and bladder issues.  [name] continues to take Zytiga.  [name] reports having fatigue.  [name] has been given a one month follow up appointment card.  BP 125/70 mmHg  Pulse 89  Temp(Src) 98.4 F (36.9 C) (Oral)  Resp 16  Ht '5\' 8"'$  (1.727 m)  Wt 162 lb 9.6 oz (73.755 kg)  BMI 24.73 kg/m2

## 2014-05-04 NOTE — Progress Notes (Signed)
  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 . . . . . . . The patient presents for routine under treatment assessment.                                   The patient is without complaint. He does have some fatigue but is able to continue you work his usual schedule                                 Set-up films were reviewed.                                 The chart was checked. Physical Findings. . .  height is '5\' 8"'$  (1.727 m) and weight is 162 lb 9.6 oz (73.755 kg). His oral temperature is 98.4 F (36.9 C). His blood pressure is 125/70 and his pulse is 89. His respiration is 16. . Weight essentially stable.  No significant changes. Impression . . . . . . . The patient is tolerating radiation. Plan . . . . . . . . . . . . Continue treatment as planned.  ________________________________   Blair Promise, PhD, MD

## 2014-05-05 ENCOUNTER — Ambulatory Visit: Payer: Medicare Other

## 2014-05-05 ENCOUNTER — Ambulatory Visit
Admission: RE | Admit: 2014-05-05 | Discharge: 2014-05-05 | Disposition: A | Discharge status: Home / Self Care | Payer: Medicare Other | Source: Ambulatory Visit | Attending: Radiation Oncology | Admitting: Radiation Oncology

## 2014-05-05 DIAGNOSIS — C7951 Secondary malignant neoplasm of bone: Secondary | ICD-10-CM | POA: Diagnosis not present

## 2014-05-05 DIAGNOSIS — C61 Malignant neoplasm of prostate: Secondary | ICD-10-CM | POA: Diagnosis not present

## 2014-05-06 ENCOUNTER — Ambulatory Visit: Payer: Medicare Other

## 2014-05-06 ENCOUNTER — Encounter: Payer: Self-pay | Admitting: Radiation Oncology

## 2014-05-06 ENCOUNTER — Ambulatory Visit
Admission: RE | Admit: 2014-05-06 | Discharge: 2014-05-06 | Disposition: A | Discharge status: Home / Self Care | Payer: Medicare Other | Source: Ambulatory Visit | Attending: Radiation Oncology | Admitting: Radiation Oncology

## 2014-05-06 DIAGNOSIS — C61 Malignant neoplasm of prostate: Secondary | ICD-10-CM | POA: Diagnosis not present

## 2014-05-06 DIAGNOSIS — C7951 Secondary malignant neoplasm of bone: Secondary | ICD-10-CM | POA: Diagnosis not present

## 2014-05-07 ENCOUNTER — Ambulatory Visit: Payer: Medicare Other

## 2014-05-07 NOTE — Progress Notes (Incomplete)
°  Radiation Oncology         [phone] ________________________________  Name: [name] MRN: [id number]  Date: 05/06/2014  DOB: April 24, 1945  End of Treatment Note   ICD-9-CM ICD-10-CM    1. 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 Prostate cancer 185 C61     Diagnosis: Metastatic prostate cancer     Indication for treatment:  ***       Radiation treatment dates:   04/22/2014-05/06/2014  Site/dose:   ***  Beams/energy:   ***  Narrative: The patient tolerated radiation treatment relatively well.   ***  Plan: The patient has completed radiation treatment. The patient will return to radiation oncology clinic for routine followup in one month. I advised them to call or return sooner if they have any questions or concerns related to their recovery or treatment.  -----------------------------------  Blair Promise, PhD, MD

## 2014-05-10 ENCOUNTER — Encounter: Payer: Self-pay | Admitting: Radiation Oncology

## 2014-05-10 ENCOUNTER — Ambulatory Visit: Payer: Medicare Other

## 2014-05-10 NOTE — Progress Notes (Signed)
  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   He did have some mild fatigue  but no other issues. The patient was able to continue working throughout his treatment.  Plan: The patient has completed radiation treatment. The patient will return to radiation oncology clinic for routine followup in one month. I advised them to call or return sooner if they have any questions or concerns related to their recovery or treatment.  -----------------------------------  Blair Promise, PhD, MD

## 2014-05-11 ENCOUNTER — Ambulatory Visit: Payer: Medicare Other

## 2014-05-11 DIAGNOSIS — R0789 Other chest pain: Secondary | ICD-10-CM | POA: Diagnosis not present

## 2014-05-11 DIAGNOSIS — E780 Pure hypercholesterolemia: Secondary | ICD-10-CM | POA: Diagnosis not present

## 2014-05-11 DIAGNOSIS — I10 Essential (primary) hypertension: Secondary | ICD-10-CM | POA: Diagnosis not present

## 2014-05-11 DIAGNOSIS — R7309 Other abnormal glucose: Secondary | ICD-10-CM | POA: Diagnosis not present

## 2014-05-17 ENCOUNTER — Other Ambulatory Visit: Payer: Self-pay | Admitting: Hematology & Oncology

## 2014-05-20 ENCOUNTER — Ambulatory Visit: Payer: Medicare Other | Admitting: Hematology & Oncology

## 2014-05-20 ENCOUNTER — Ambulatory Visit: Payer: Medicare Other

## 2014-05-20 ENCOUNTER — Other Ambulatory Visit: Payer: Medicare Other

## 2014-05-20 DIAGNOSIS — E119 Type 2 diabetes mellitus without complications: Secondary | ICD-10-CM | POA: Diagnosis not present

## 2014-05-20 DIAGNOSIS — E785 Hyperlipidemia, unspecified: Secondary | ICD-10-CM | POA: Diagnosis not present

## 2014-05-20 DIAGNOSIS — I2510 Atherosclerotic heart disease of native coronary artery without angina pectoris: Secondary | ICD-10-CM | POA: Diagnosis not present

## 2014-05-21 ENCOUNTER — Ambulatory Visit (HOSPITAL_BASED_OUTPATIENT_CLINIC_OR_DEPARTMENT_OTHER): Payer: Medicare Other

## 2014-05-21 ENCOUNTER — Ambulatory Visit (HOSPITAL_BASED_OUTPATIENT_CLINIC_OR_DEPARTMENT_OTHER): Payer: Medicare Other | Admitting: Hematology & Oncology

## 2014-05-21 ENCOUNTER — Other Ambulatory Visit (HOSPITAL_BASED_OUTPATIENT_CLINIC_OR_DEPARTMENT_OTHER): Payer: Medicare Other

## 2014-05-21 ENCOUNTER — Encounter: Payer: Self-pay | Admitting: Hematology & Oncology

## 2014-05-21 VITALS — BP 114/71 | HR 90 | Temp 98.1°F | Resp 18 | Ht 68.0 in | Wt 164.8 lb

## 2014-05-21 DIAGNOSIS — C7951 Secondary malignant neoplasm of bone: Secondary | ICD-10-CM | POA: Diagnosis not present

## 2014-05-21 DIAGNOSIS — Z5111 Encounter for antineoplastic chemotherapy: Secondary | ICD-10-CM | POA: Diagnosis not present

## 2014-05-21 DIAGNOSIS — C61 Malignant neoplasm of prostate: Secondary | ICD-10-CM

## 2014-05-21 LAB — CBC WITH DIFFERENTIAL (CANCER CENTER ONLY)
BASO#: 0 10*3/uL (ref 0.0–0.2)
BASO%: 0.3 % (ref 0.0–2.0)
EOS%: 3.1 % (ref 0.0–7.0)
Eosinophils Absolute: 0.2 10*3/uL (ref 0.0–0.5)
HCT: 35.4 % — ABNORMAL LOW (ref 38.7–49.9)
HGB: 11.7 g/dL — ABNORMAL LOW (ref 13.0–17.1)
LYMPH#: 0.9 10*3/uL (ref 0.9–3.3)
LYMPH%: 15.1 % (ref 14.0–48.0)
MCH: 31.7 pg (ref 28.0–33.4)
MCHC: 33.1 g/dL (ref 32.0–35.9)
MCV: 96 fL (ref 82–98)
MONO#: 0.4 10*3/uL (ref 0.1–0.9)
MONO%: 6.8 % (ref 0.0–13.0)
NEUT#: 4.4 10*3/uL (ref 1.5–6.5)
NEUT%: 74.7 % (ref 40.0–80.0)
Platelets: 209 10*3/uL (ref 145–400)
RBC: 3.69 10*6/uL — ABNORMAL LOW (ref 4.20–5.70)
RDW: 13 % (ref 11.1–15.7)
WBC: 5.8 10*3/uL (ref 4.0–10.0)

## 2014-05-21 LAB — CMP (CANCER CENTER ONLY)
ALT(SGPT): 17 U/L (ref 10–47)
AST: 18 U/L (ref 11–38)
Albumin: 3.2 g/dL — ABNORMAL LOW (ref 3.3–5.5)
Alkaline Phosphatase: 59 U/L (ref 26–84)
BUN, Bld: 11 mg/dL (ref 7–22)
CO2: 29 mEq/L (ref 18–33)
Calcium: 9.2 mg/dL (ref 8.0–10.3)
Chloride: 103 mEq/L (ref 98–108)
Creat: 1.1 mg/dl (ref 0.6–1.2)
Glucose, Bld: 151 mg/dL — ABNORMAL HIGH (ref 73–118)
Potassium: 3.7 mEq/L (ref 3.3–4.7)
Sodium: 141 mEq/L (ref 128–145)
Total Bilirubin: 0.8 mg/dl (ref 0.20–1.60)
Total Protein: 6.9 g/dL (ref 6.4–8.1)

## 2014-05-21 MED ORDER — METHYLPREDNISOLONE SODIUM SUCC 125 MG IJ SOLR
INTRAMUSCULAR | Status: AC
  Filled 2014-05-21: qty 2

## 2014-05-21 MED ORDER — LEUPROLIDE ACETATE (3 MONTH) 22.5 MG IM KIT
22.5000 mg | PACK | Freq: Once | INTRAMUSCULAR | Status: AC
Start: 2014-05-21 — End: 2014-05-21
  Administered 2014-05-21: 22.5 mg via INTRAMUSCULAR
  Filled 2014-05-21: qty 22.5

## 2014-05-21 MED ORDER — FAMOTIDINE IN NACL 20-0.9 MG/50ML-% IV SOLN
40.0000 mg | Freq: Once | INTRAVENOUS | Status: AC
  Administered 2014-05-21: 40 mg via INTRAVENOUS

## 2014-05-21 MED ORDER — METHYLPREDNISOLONE SODIUM SUCC 125 MG IJ SOLR
125.0000 mg | Freq: Once | INTRAMUSCULAR | Status: AC
  Administered 2014-05-21: 125 mg via INTRAVENOUS

## 2014-05-21 MED ORDER — DENOSUMAB 120 MG/1.7ML ~~LOC~~ SOLN
120.0000 mg | Freq: Once | SUBCUTANEOUS | Status: AC
Start: 2014-05-21 — End: 2014-05-21
  Administered 2014-05-21: 120 mg via SUBCUTANEOUS
  Filled 2014-05-21: qty 1.7

## 2014-05-21 MED ORDER — FAMOTIDINE IN NACL 20-0.9 MG/50ML-% IV SOLN
INTRAVENOUS | Status: AC
  Filled 2014-05-21: qty 100

## 2014-05-21 NOTE — Progress Notes (Signed)
Hematology and Oncology Follow Up Visit  [name] [id number] Jun 05, 1945 69 y.o. 05/21/2014   Principle Diagnosis:   Metastatic castrate resistant prostate cancer  Current Therapy:    Zytiga 1000 mg by mouth daily  Xgeva 120 mg subcutaneous Q3 months - due in June  Lupron 22 mg IM every 3 months - due  In June     Interim History:  [name] is back for Sellers. He is doing a little better. His radiation was completed in April. He tolerated this well.  He did see his cardiologist. I appreciate the cardiologist helping out. The cardiologist made some recommendations. [name] wanted to just be conservative since he's been doing well.  His last PSA was holding steady. It was 0.33.  He's had no problems with pain.  His blood sugars have been on the higher side.  He comes in with his urticaria on his left arm. And also slight he has some insect bites. He has a little area of urticaria in the sternal notch region. These areas of urticaria have a central blister. He's had this for I think couple weeks maybe. He's not change medications. He's had no travel. 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  I will go ahead give him some IV site Medrol and Pepcid.  I think that he probably will need a bone scan we see him back. By then, we should see how things look where the radiation was administered.  Marland Kitchen His last PSA was 0.33. For him, this is on the higher side.  He continues on the [place]. He seems to be doing pretty well with this.  He's trying to watch what he eats. He does have some steroid-induced hyperglycemia. He is on metformin and Amaryl. Marland Kitchen  He is still working. He is doing well with work. He does not get chest pain. He does not have palpitations.  He's had no leg swelling.  His performance status is ECOG 1.'   Medications:  Current outpatient prescriptions:  .  isosorbide mononitrate (IMDUR) 30 MG 24 hr tablet, Take 30 mg by mouth daily.,  Disp: , Rfl:  .  aspirin 81 MG tablet, Take 81 mg by mouth daily., Disp: , Rfl:  .  Calcium Carbonate-Vitamin D (CALCIUM + D PO), Take 500 mg by mouth 2 (two) times daily., Disp: , Rfl:  .  gabapentin (NEURONTIN) 100 MG capsule, TAKE 1 CAPSULE BY MOUTH EVERY 8 HOURS, Disp: 90 capsule, Rfl: 6 .  glimepiride (AMARYL) 1 MG tablet, TAKE TWO TABLETS BY MOUTH DAILY WITH BREAKFAST, Disp: 60 tablet, Rfl: 4 .  metFORMIN (GLUCOPHAGE XR) 500 MG 24 hr tablet, Take 1 tablet (500 mg total) by mouth daily with breakfast., Disp: 60 tablet, Rfl: 4 .  Multiple Minerals-Vitamins (CALCIUM & VIT D3 BONE HEALTH PO), Take by mouth every morning. , Disp: , Rfl:  .  omeprazole (PRILOSEC) 20 MG capsule, Take 20 mg by mouth daily. , Disp: , Rfl:  .  OVER THE COUNTER MEDICATION, every morning. Juice Plus, Disp: , Rfl:  .  polyethylene glycol powder (MIRALAX) powder, Take 17 g by mouth daily., Disp: , Rfl:  .  predniSONE (DELTASONE) 5 MG tablet, TAKE 1 TABLET BY MOUTH ONCE DAILY, Disp: 60 tablet, Rfl: 0 .  solifenacin (VESICARE) 5 MG tablet, HOW TAKES WEEKLY ONLY, Disp: , Rfl:  .  traZODone (DESYREL) 50 MG tablet, bedtime as needed, Disp: , Rfl:  .  traZODone (DESYREL) 50 MG tablet, TAKE  1 & 1/2 TABLETS BY MOUTH (75 MG TOTAL) BY MOUTH AT BEDTIME., Disp: 45 tablet, Rfl: 4 .  ZYTIGA 250 MG tablet, TAKE 4 TABLETS (1,000 MG TOTAL) BY MOUTH DAILY. TAKE ON AN EMPTY STOMACH 1 HOUR BEFORE OR 2 HOURS AFTER A MEAL, Disp: 120 tablet, Rfl: 0  Allergies:  Allergies  Allergen Reactions  . Codeine Nausea And Vomiting  . Hydrocodone Nausea And Vomiting    Past Medical History, Surgical history, Social history, and Family History were reviewed and updated.  Review of Systems: As above  Physical Exam:  height is '5\' 8"'$  (1.727 m) and weight is 164 lb 12.8 oz (74.753 kg). His oral temperature is 98.1 F (36.7 C). His blood pressure is 114/71 and his pulse is 90. His respiration is 18.   Well-developed and well-nourished white male.  Head and neck exam shows no ocular or oral lesions. The are no palpable cervical or supraclavicular lymph nodes. Lungs are clear with no rales, wheezes or rhonchi. Cardiac exam regular rate and rhythm with no murmurs rubs or bruits. Abdomen is soft. He has good bowel sounds. There is no fluid wave. There is no palpable liver or spleen tip. Back exam shows no tenderness over the spine ribs or hips. I cannot elicit any tenderness over the sacrum bilaterally. He has good range of motion of his pelvis. Extremities shows no clubbing, cyanosis or edema. Skin exam shows areas of urticaria on the left forearm.. There is also an area as solitary in the upper chest wall in the upper sternal region. These are nontender. There are erythematous with a central area of vesicle. No erythema to the skin is noted. Neurological exam is nonfocal.   Lab Results  Component Value Date   WBC 5.8 05/21/2014   HGB 11.7* 05/21/2014   HCT 35.4* 05/21/2014   MCV 96 05/21/2014   PLT 209 05/21/2014     Chemistry      Component Value Date/Time   NA 141 05/21/2014 0815   NA 137 04/08/2014 1304   NA 140 03/12/2014 0820   K 3.7 05/21/2014 0815   K 4.1 04/08/2014 1304   K 3.9 03/12/2014 0820   CL 103 05/21/2014 0815   CL 100 04/08/2014 1304   CO2 29 05/21/2014 0815   CO2 27 04/08/2014 1304   CO2 26 03/12/2014 0820   BUN 11 05/21/2014 0815   BUN 12 04/08/2014 1304   BUN 18.0 03/12/2014 0820   CREATININE 1.1 05/21/2014 0815   CREATININE 0.96 04/08/2014 1304   CREATININE 1.1 03/12/2014 0820      Component Value Date/Time   CALCIUM 9.2 05/21/2014 0815   CALCIUM 9.4 04/08/2014 1304   CALCIUM 9.5 03/12/2014 0820   ALKPHOS 59 05/21/2014 0815   ALKPHOS 57 04/08/2014 1304   ALKPHOS 74 03/12/2014 0820   AST 18 05/21/2014 0815   AST 15 04/08/2014 1304   AST 15 03/12/2014 0820   ALT 17 05/21/2014 0815   ALT 12 04/08/2014 1304   ALT 9 03/12/2014 0820   BILITOT 0.80 05/21/2014 0815   BILITOT 0.5 04/08/2014 1304    BILITOT 0.37 03/12/2014 0820         Impression and Plan: [name] is 69 year old gentleman with metastatic prostate cancer.he completed his radiation therapy. This was back in mid April. I probably will do another bone scan on him in late June.  We will see what his PSA is. This is always a good indicator for response.  He is on prednisone  with the sciatica. I'm going to have him increase his prednisone dose for 6 days to try to help with this urticarial rash that he has.  I want to see him back in another 6 weeks. [name], MD 5/6/20162:45 PM

## 2014-05-21 NOTE — Patient Instructions (Signed)
Leuprolide depot injection or implant What is this medicine? LEUPROLIDE (loo PROE lide) is a man-made protein that acts like a natural hormone in the body. It decreases testosterone in men and decreases estrogen in women. In men, this medicine is used to treat advanced prostate cancer. In women, some forms of this medicine may be used to treat endometriosis, uterine fibroids, or other male hormone-related problems. This medicine may be used for other purposes; ask your health care provider or pharmacist if you have questions. COMMON BRAND NAME(S): Eligard, Lupron Depot, Lupron Depot-Ped, Viadur What should I tell my health care provider before I take this medicine? They need to know if you have any of these conditions: -diabetes -heart disease or previous heart attack -high blood pressure -high cholesterol -osteoporosis -pain or difficulty passing urine -spinal cord metastasis -stroke -tobacco smoker -unusual vaginal bleeding (women) -an unusual or allergic reaction to leuprolide, benzyl alcohol, other medicines, foods, dyes, or preservatives -pregnant or trying to get pregnant -breast-feeding How should I use this medicine? This medicine is for injection into a muscle or for implant or injection under the skin. It is given by a health care professional in a hospital or clinic setting. The specific product will determine how it will be given to you. Make sure you understand which product you receive and how often you will receive it. Talk to your pediatrician regarding the use of this medicine in children. Special care may be needed. Overdosage: If you think you have taken too much of this medicine contact a poison control center or emergency room at once. NOTE: This medicine is only for you. Do not share this medicine with others. What if I miss a dose? It is important not to miss a dose. Call your doctor or health care professional if you are unable to keep an appointment. Depot  injections: Depot injections are given either once-monthly, every 12 weeks, every 16 weeks, or every 24 weeks depending on the product you are prescribed. The product you are prescribed will be based on if you are male or male, and your condition. Make sure you understand your product and dosing. Implant dosing: The implant is removed and replaced once a year. The implant is only used in males. What may interact with this medicine? Do not take this medicine with any of the following medications: -chasteberry This medicine may also interact with the following medications: -herbal or dietary supplements, like black cohosh or DHEA -male hormones, like estrogens or progestins and birth control pills, patches, rings, or injections -male hormones, like testosterone This list may not describe all possible interactions. Give your health care provider a list of all the medicines, herbs, non-prescription drugs, or dietary supplements you use. Also tell them if you smoke, drink alcohol, or use illegal drugs. Some items may interact with your medicine. What should I watch for while using this medicine? Visit your doctor or health care professional for regular checks on your progress. During the first weeks of treatment, your symptoms may get worse, but then will improve as you continue your treatment. You may get hot flashes, increased bone pain, increased difficulty passing urine, or an aggravation of nerve symptoms. Discuss these effects with your doctor or health care professional, some of them may improve with continued use of this medicine. Male patients may experience a menstrual cycle or spotting during the first months of therapy with this medicine. If this continues, contact your doctor or health care professional. What side effects may I notice from  receiving this medicine? Side effects that you should report to your doctor or health care professional as soon as possible: -allergic reactions like  skin rash, itching or hives, swelling of the face, lips, or tongue -breathing problems -chest pain -depression or memory disorders -pain in your legs or groin -pain at site where injected or implanted -severe headache -swelling of the feet and legs -visual changes -vomiting Side effects that usually do not require medical attention (report to your doctor or health care professional if they continue or are bothersome): -breast swelling or tenderness -decrease in sex drive or performance -diarrhea -hot flashes -loss of appetite -muscle, joint, or bone pains -nausea -redness or irritation at site where injected or implanted -skin problems or acne This list may not describe all possible side effects. Call your doctor for medical advice about side effects. You may report side effects to FDA at 1-800-FDA-1088. Where should I keep my medicine? This drug is given in a hospital or clinic and will not be stored at home. NOTE: This sheet is a summary. It may not cover all possible information. If you have questions about this medicine, talk to your doctor, pharmacist, or health care provider.  2015, Elsevier/Gold Standard. (2009-07-05 14:41:21) Denosumab injection What is this medicine? DENOSUMAB (den oh sue mab) slows bone breakdown. Prolia is used to treat osteoporosis in women after menopause and in men. Delton See is used to prevent bone fractures and other bone problems caused by cancer bone metastases. Delton See is also used to treat giant cell tumor of the bone. This medicine may be used for other purposes; ask your health care provider or pharmacist if you have questions. COMMON BRAND NAME(S): Prolia, XGEVA What should I tell my health care provider before I take this medicine? They need to know if you have any of these conditions: -dental disease -eczema -infection or history of infections -kidney disease or on dialysis -low blood calcium or vitamin D -malabsorption syndrome -scheduled to  have surgery or tooth extraction -taking medicine that contains denosumab -thyroid or parathyroid disease -an unusual reaction to denosumab, other medicines, foods, dyes, or preservatives -pregnant or trying to get pregnant -breast-feeding How should I use this medicine? This medicine is for injection under the skin. It is given by a health care professional in a hospital or clinic setting. If you are getting Prolia, a special MedGuide will be given to you by the pharmacist with each prescription and refill. Be sure to read this information carefully each time. For Prolia, talk to your pediatrician regarding the use of this medicine in children. Special care may be needed. For Delton See, talk to your pediatrician regarding the use of this medicine in children. While this drug may be prescribed for children as young as 13 years for selected conditions, precautions do apply. Overdosage: If you think you've taken too much of this medicine contact a poison control center or emergency room at once. Overdosage: If you think you have taken too much of this medicine contact a poison control center or emergency room at once. NOTE: This medicine is only for you. Do not share this medicine with others. What if I miss a dose? It is important not to miss your dose. Call your doctor or health care professional if you are unable to keep an appointment. What may interact with this medicine? Do not take this medicine with any of the following medications: -other medicines containing denosumab This medicine may also interact with the following medications: -medicines that suppress the immune  system -medicines that treat cancer -steroid medicines like prednisone or cortisone This list may not describe all possible interactions. Give your health care provider a list of all the medicines, herbs, non-prescription drugs, or dietary supplements you use. Also tell them if you smoke, drink alcohol, or use illegal drugs.  Some items may interact with your medicine. What should I watch for while using this medicine? Visit your doctor or health care professional for regular checks on your progress. Your doctor or health care professional may order blood tests and other tests to see how you are doing. Call your doctor or health care professional if you get a cold or other infection while receiving this medicine. Do not treat yourself. This medicine may decrease your body's ability to fight infection. You should make sure you get enough calcium and vitamin D while you are taking this medicine, unless your doctor tells you not to. Discuss the foods you eat and the vitamins you take with your health care professional. See your dentist regularly. Brush and floss your teeth as directed. Before you have any dental work done, tell your dentist you are receiving this medicine. Do not become pregnant while taking this medicine or for 5 months after stopping it. Women should inform their doctor if they wish to become pregnant or think they might be pregnant. There is a potential for serious side effects to an unborn child. Talk to your health care professional or pharmacist for more information. What side effects may I notice from receiving this medicine? Side effects that you should report to your doctor or health care professional as soon as possible: -allergic reactions like skin rash, itching or hives, swelling of the face, lips, or tongue -breathing problems -chest pain -fast, irregular heartbeat -feeling faint or lightheaded, falls -fever, chills, or any other sign of infection -muscle spasms, tightening, or twitches -numbness or tingling -skin blisters or bumps, or is dry, peels, or red -slow healing or unexplained pain in the mouth or jaw -unusual bleeding or bruising Side effects that usually do not require medical attention (Report these to your doctor or health care professional if they continue or are  bothersome.): -muscle pain -stomach upset, gas This list may not describe all possible side effects. Call your doctor for medical advice about side effects. You may report side effects to FDA at 1-800-FDA-1088. Where should I keep my medicine? This medicine is only given in a clinic, doctor's office, or other health care setting and will not be stored at home. NOTE: This sheet is a summary. It may not cover all possible information. If you have questions about this medicine, talk to your doctor, pharmacist, or health care provider.  2015, Elsevier/Gold Standard. (2011-07-02 12:37:47)

## 2014-05-22 LAB — PSA: PSA: 0.06 ng/mL (ref <=4.00)

## 2014-05-24 ENCOUNTER — Other Ambulatory Visit: Payer: Self-pay | Admitting: *Deleted

## 2014-05-24 ENCOUNTER — Encounter: Payer: Self-pay | Admitting: Nurse Practitioner

## 2014-05-24 ENCOUNTER — Encounter: Payer: Self-pay | Admitting: *Deleted

## 2014-05-24 DIAGNOSIS — R739 Hyperglycemia, unspecified: Secondary | ICD-10-CM

## 2014-05-24 MED ORDER — METFORMIN HCL ER 500 MG PO TB24: 1000.0000 mg | ORAL_TABLET | Freq: Every day | ORAL | Status: DC

## 2014-05-24 MED ORDER — PREDNISONE 5 MG PO TABS: 5.0000 mg | ORAL_TABLET | Freq: Every day | ORAL | Status: DC

## 2014-05-24 NOTE — Progress Notes (Signed)
Spoke with the wife and per Dr. Marin Olp explained to her that patient should follow the following schedule for his prednisone: Sat 5/7 6 pills, 5/8 5 pills, 5/9 4 pills, 5/10 3 pills, 5/11 2 pills, 5/12 begin taking 1 pill daily. She verbalized understanding and appreciation.

## 2014-06-09 ENCOUNTER — Encounter: Payer: Self-pay | Admitting: Oncology

## 2014-06-10 ENCOUNTER — Ambulatory Visit
Admission: RE | Admit: 2014-06-10 | Discharge: 2014-06-10 | Disposition: A | Discharge status: Home / Self Care | Payer: Medicare Other | Source: Ambulatory Visit | Attending: Radiation Oncology | Admitting: Radiation Oncology

## 2014-06-10 ENCOUNTER — Encounter: Payer: Self-pay | Admitting: Radiation Oncology

## 2014-06-10 VITALS — BP 138/79 | HR 70 | Temp 98.2°F | Resp 16 | Ht 68.0 in | Wt 159.3 lb

## 2014-06-10 DIAGNOSIS — C61 Malignant neoplasm of prostate: Secondary | ICD-10-CM

## 2014-06-10 NOTE — Progress Notes (Signed)
[name] here for follow up.  He denies having any pain.  He is taking zytiga.  He reports occasional fatigue.    BP 138/79 mmHg  Pulse 70  Temp(Src) 98.2 F (36.8 C) (Oral)  Resp 16  Ht '5\' 8"'$  (1.727 m)  Wt 159 lb 4.8 oz (72.258 kg)  BMI 24.23 kg/m2

## 2014-06-10 NOTE — Progress Notes (Signed)
Radiation Oncology         [phone] ________________________________  Name: [name] MRN: [id number]  Date: 06/10/2014  DOB: Apr 28, 1945  Follow-Up Visit Note  CC: Nicoletta Dress, MD  Nicoletta Dress, MD  Diagnosis:   Metastatic prostate cancer   Narrative:  [name] here for follow up. He denies having any pain. He is taking Zytiga. He reports occasional fatigue. Patient says his cholesterol is high. Family member said patient has been to see a cardiologist who found some irregularities with patient heart. Patient schedule for a bone scan on June 10.  ALLERGIES:  is allergic to codeine and hydrocodone.  Meds: Current Outpatient Prescriptions  Medication Sig Dispense Refill  . aspirin 81 MG tablet Take 81 mg by mouth daily.    . Calcium Carbonate-Vitamin D (CALCIUM + D PO) Take 500 mg by mouth 2 (two) times daily.    Marland Kitchen gabapentin (NEURONTIN) 100 MG capsule TAKE 1 CAPSULE BY MOUTH EVERY 8 HOURS 90 capsule 6  . glimepiride (AMARYL) 1 MG tablet TAKE TWO TABLETS BY MOUTH DAILY WITH BREAKFAST 60 tablet 4  . isosorbide mononitrate (IMDUR) 30 MG 24 hr tablet Take 30 mg by mouth daily.    . metFORMIN (GLUCOPHAGE XR) 500 MG 24 hr tablet Take 2 tablets (1,000 mg total) by mouth daily with breakfast. 60 tablet 4  . Multiple Minerals-Vitamins (CALCIUM & VIT D3 BONE HEALTH PO) Take by mouth every morning.     Marland Kitchen OVER THE COUNTER MEDICATION every morning. Juice Plus    . polyethylene glycol powder (MIRALAX) powder Take 17 g by mouth daily.    . predniSONE (DELTASONE) 5 MG tablet TAKE 1 TABLET BY MOUTH ONCE DAILY 60 tablet 0  . solifenacin (VESICARE) 5 MG tablet HOW TAKES WEEKLY ONLY    . traZODone (DESYREL) 50 MG tablet bedtime as needed    . ZYTIGA 250 MG tablet TAKE 4 TABLETS (1,000 MG TOTAL) BY MOUTH DAILY. TAKE ON AN EMPTY STOMACH 1 HOUR BEFORE OR 2 HOURS AFTER A MEAL 120 tablet 0  . predniSONE (DELTASONE) 5 MG tablet Take 1 tablet (5 mg total) by mouth daily with  breakfast. On Day 1 take 6 pills by mouth, Day 2 - take 5 pills, on Day 3 take 4 pills, on Day 4 take 3 pills, on Day 5 take 2 pills, on Day 6 take 1 pill for urticarial rash. (Patient not taking: Reported on 06/10/2014) 21 tablet 0  . traZODone (DESYREL) 50 MG tablet TAKE 1 & 1/2 TABLETS BY MOUTH (75 MG TOTAL) BY MOUTH AT BEDTIME. (Patient not taking: Reported on 06/10/2014) 45 tablet 4   No current facility-administered medications for this encounter.    Physical Findings: The patient is in no acute distress. Patient is alert and oriented.  Lungs are clear. Heart has regular rate and rhythm. No palpable cervical, supraclavicular, or axillary adenopathy. Bowel sounds present in all quadrants. Good strength in lower and upper extremities.  BP 138/79 mmHg  Pulse 70  Temp(Src) 98.2 F (36.8 C) (Oral)  Resp 16  Ht '5\' 8"'$  (1.727 m)  Wt 159 lb 4.8 oz (72.258 kg)  BMI 24.23 kg/m2. No tenderness with palpation along the sacral region   Lab Findings: Lab Results  Component Value Date   WBC 5.8 05/21/2014   HGB 11.7* 05/21/2014   HCT 35.4* 05/21/2014   MCV 96 05/21/2014   PLT 209 05/21/2014    Radiographic Findings: No results found.  Impression: . Patient doing  well clinically, no pain.  Plan: Close follow up with medical oncology and follow up with radiation oncology PRN.  This document serves as a record of services personally performed by [name], MD. It was created on his behalf by Jeralene Peters, a trained medical scribe. The creation of this record is based on the scribe's personal observations and the provider's statements to them. This document has been checked and approved by the attending provider.      ____________________________________ Blair Promise, PhD., MD

## 2014-06-18 ENCOUNTER — Other Ambulatory Visit: Payer: Self-pay | Admitting: Hematology & Oncology

## 2014-06-28 ENCOUNTER — Encounter: Payer: Self-pay | Admitting: Hematology & Oncology

## 2014-06-29 ENCOUNTER — Encounter (HOSPITAL_COMMUNITY)
Admission: RE | Admit: 2014-06-29 | Discharge: 2014-06-29 | Disposition: A | Discharge status: Home / Self Care | Payer: Medicare Other | Source: Ambulatory Visit | Attending: Hematology & Oncology | Admitting: Hematology & Oncology

## 2014-06-29 ENCOUNTER — Other Ambulatory Visit: Payer: Self-pay | Admitting: Hematology & Oncology

## 2014-06-29 DIAGNOSIS — C61 Malignant neoplasm of prostate: Secondary | ICD-10-CM

## 2014-06-29 MED ORDER — TECHNETIUM TC 99M MEDRONATE IV KIT
24.9000 | PACK | Freq: Once | INTRAVENOUS | Status: AC | PRN
  Administered 2014-06-29: 24.9 via INTRAVENOUS

## 2014-06-30 ENCOUNTER — Telehealth: Payer: Self-pay | Admitting: *Deleted

## 2014-06-30 NOTE — Telephone Encounter (Signed)
I called him and left a message on his answer machine at home. I told him about the bone scan results. I said there is no new areas of activity. Where he had radiation looks like it probably is better. If I told him to make sure he does not get dehydrated while working outside.         I think we will see him soon.        I told him to tell his wife I said "hi!!"

## 2014-07-02 ENCOUNTER — Other Ambulatory Visit (HOSPITAL_BASED_OUTPATIENT_CLINIC_OR_DEPARTMENT_OTHER): Payer: Medicare Other

## 2014-07-02 ENCOUNTER — Ambulatory Visit (HOSPITAL_BASED_OUTPATIENT_CLINIC_OR_DEPARTMENT_OTHER): Payer: Medicare Other | Admitting: Hematology & Oncology

## 2014-07-02 ENCOUNTER — Encounter: Payer: Self-pay | Admitting: Hematology & Oncology

## 2014-07-02 VITALS — BP 123/66 | HR 83 | Temp 98.0°F | Resp 20 | Wt 158.5 lb

## 2014-07-02 DIAGNOSIS — C61 Malignant neoplasm of prostate: Secondary | ICD-10-CM | POA: Diagnosis not present

## 2014-07-02 DIAGNOSIS — C7951 Secondary malignant neoplasm of bone: Secondary | ICD-10-CM

## 2014-07-02 LAB — CBC WITH DIFFERENTIAL (CANCER CENTER ONLY)
BASO#: 0 10*3/uL (ref 0.0–0.2)
BASO%: 0.4 % (ref 0.0–2.0)
EOS%: 3 % (ref 0.0–7.0)
Eosinophils Absolute: 0.2 10*3/uL (ref 0.0–0.5)
HCT: 38.1 % — ABNORMAL LOW (ref 38.7–49.9)
HGB: 12.7 g/dL — ABNORMAL LOW (ref 13.0–17.1)
LYMPH#: 1.1 10*3/uL (ref 0.9–3.3)
LYMPH%: 19.2 % (ref 14.0–48.0)
MCH: 32.2 pg (ref 28.0–33.4)
MCHC: 33.3 g/dL (ref 32.0–35.9)
MCV: 97 fL (ref 82–98)
MONO#: 0.4 10*3/uL (ref 0.1–0.9)
MONO%: 6.4 % (ref 0.0–13.0)
NEUT#: 4 10*3/uL (ref 1.5–6.5)
NEUT%: 71 % (ref 40.0–80.0)
Platelets: 208 10*3/uL (ref 145–400)
RBC: 3.95 10*6/uL — ABNORMAL LOW (ref 4.20–5.70)
RDW: 12.8 % (ref 11.1–15.7)
WBC: 5.6 10*3/uL (ref 4.0–10.0)

## 2014-07-02 LAB — CMP (CANCER CENTER ONLY)
ALT(SGPT): 20 U/L (ref 10–47)
AST: 16 U/L (ref 11–38)
Albumin: 3.5 g/dL (ref 3.3–5.5)
Alkaline Phosphatase: 73 U/L (ref 26–84)
BUN, Bld: 17 mg/dL (ref 7–22)
CO2: 29 mEq/L (ref 18–33)
Calcium: 9 mg/dL (ref 8.0–10.3)
Chloride: 98 mEq/L (ref 98–108)
Creat: 1.6 mg/dl — ABNORMAL HIGH (ref 0.6–1.2)
Glucose, Bld: 147 mg/dL — ABNORMAL HIGH (ref 73–118)
Potassium: 3.9 mEq/L (ref 3.3–4.7)
Sodium: 140 mEq/L (ref 128–145)
Total Bilirubin: 0.8 mg/dl (ref 0.20–1.60)
Total Protein: 6.6 g/dL (ref 6.4–8.1)

## 2014-07-02 NOTE — Progress Notes (Signed)
Hematology and Oncology Follow Up Visit  [name] [id number] 1945-08-17 69 y.o. 07/02/2014   Principle Diagnosis:   Metastatic castrate resistant prostate cancer  Current Therapy:    Zytiga 1000 mg by mouth daily  Xgeva 120 mg subcutaneous Q3 months - due in June  Lupron 22 mg IM every 3 months - due  In June     Interim History:  [name] is back for followup. He is doing a little better. His radiation was completed in April. He tolerated this well.  We repeated his bone scan. Bone scan shows some slight activity in the right sacrum. There is also some slight activity in the left hip. Again, this does not surprise me.   his last PSA was down to 0.06. This is a good indicator as to how he is doing.  He does have some hyper glycemia. He is on metformin for this. I told him that the metformin may have an anticancer property in addition to control his blood sugars.  He now is on Imdurr. He also is on Zetia. He will not take a statin drug.  He did see his cardiologist. I appreciate the cardiologist helping out.   His performance status is ECOG 1.'   Medications:  Current outpatient prescriptions:  .  aspirin 81 MG tablet, Take 81 mg by mouth daily., Disp: , Rfl:  .  Calcium Carbonate-Vitamin D (CALCIUM + D PO), Take 500 mg by mouth 2 (two) times daily., Disp: , Rfl:  .  gabapentin (NEURONTIN) 100 MG capsule, TAKE 1 CAPSULE BY MOUTH EVERY 8 HOURS (Patient taking differently: TAKE 1 CAPSULE BY MOUTH EVERY 12 HOURS), Disp: 90 capsule, Rfl: 6 .  glimepiride (AMARYL) 1 MG tablet, TAKE TWO TABLETS BY MOUTH DAILY WITH BREAKFAST, Disp: 60 tablet, Rfl: 4 .  isosorbide mononitrate (IMDUR) 30 MG 24 hr tablet, Take 30 mg by mouth daily., Disp: , Rfl:  .  Multiple Minerals-Vitamins (CALCIUM & VIT D3 BONE HEALTH PO), Take by mouth every morning. , Disp: , Rfl:  .  OVER THE COUNTER MEDICATION, every morning. Juice Plus, Disp: , Rfl:  .  polyethylene glycol powder (MIRALAX) powder, Take  17 g by mouth daily., Disp: , Rfl:  .  predniSONE (DELTASONE) 5 MG tablet, TAKE 1 TABLET BY MOUTH ONCE DAILY, Disp: 60 tablet, Rfl: 0 .  solifenacin (VESICARE) 5 MG tablet, HOW TAKES WEEKLY ONLY, Disp: , Rfl:  .  traZODone (DESYREL) 50 MG tablet, bedtime as needed, Disp: , Rfl:  .  ZETIA 10 MG tablet, 10 mg daily., Disp: , Rfl:  .  ZYTIGA 250 MG tablet, TAKE 4 TABLETS BY MOUTH DAILY. TAKE ON AN EMPTY STOMACH 1 HOUR BEFORE OR 2 HOURS AFTER A MEAL, Disp: 120 tablet, Rfl: 0 .  metFORMIN (GLUCOPHAGE XR) 500 MG 24 hr tablet, Take 2 tablets (1,000 mg total) by mouth daily with breakfast. (Patient taking differently: Take 1,000 mg by mouth daily with breakfast. ), Disp: 60 tablet, Rfl: 4  Allergies:  Allergies  Allergen Reactions  . Codeine Nausea And Vomiting  . Hydrocodone Nausea And Vomiting    Past Medical History, Surgical history, Social history, and Family History were reviewed and updated.  Review of Systems: As above  Physical Exam:  weight is 158 lb 8 oz (71.895 kg). His oral temperature is 98 F (36.7 C). His blood pressure is 123/66 and his pulse is 83. His respiration is 20.   Well-developed and well-nourished white male. Head and neck exam shows  no ocular or oral lesions. The are no palpable cervical or supraclavicular lymph nodes. Lungs are clear with no rales, wheezes or rhonchi. Cardiac exam regular rate and rhythm with no murmurs rubs or bruits. Abdomen is soft. He has good bowel sounds. There is no fluid wave. There is no palpable liver or spleen tip. Back exam shows no tenderness over the spine ribs or hips. I cannot elicit any tenderness over the sacrum bilaterally. He has good range of motion of his pelvis. Extremities shows no clubbing, cyanosis or edema. Skin exam shows areas of urticaria on the left forearm.. There is also an area as solitary in the upper chest wall in the upper sternal region. These are nontender. There are erythematous with a central area of vesicle. No  erythema to the skin is noted. Neurological exam is nonfocal.   Lab Results  Component Value Date   WBC 5.6 07/02/2014   HGB 12.7* 07/02/2014   HCT 38.1* 07/02/2014   MCV 97 07/02/2014   PLT 208 07/02/2014     Chemistry      Component Value Date/Time   NA 140 07/02/2014 0848   NA 137 04/08/2014 1304   NA 140 03/12/2014 0820   K 3.9 07/02/2014 0848   K 4.1 04/08/2014 1304   K 3.9 03/12/2014 0820   CL 98 07/02/2014 0848   CL 100 04/08/2014 1304   CO2 29 07/02/2014 0848   CO2 27 04/08/2014 1304   CO2 26 03/12/2014 0820   BUN 17 07/02/2014 0848   BUN 12 04/08/2014 1304   BUN 18.0 03/12/2014 0820   CREATININE 1.6* 07/02/2014 0848   CREATININE 0.96 04/08/2014 1304   CREATININE 1.1 03/12/2014 0820      Component Value Date/Time   CALCIUM 9.0 07/02/2014 0848   CALCIUM 9.4 04/08/2014 1304   CALCIUM 9.5 03/12/2014 0820   ALKPHOS 73 07/02/2014 0848   ALKPHOS 57 04/08/2014 1304   ALKPHOS 74 03/12/2014 0820   AST 16 07/02/2014 0848   AST 15 04/08/2014 1304   AST 15 03/12/2014 0820   ALT 20 07/02/2014 0848   ALT 12 04/08/2014 1304   ALT 9 03/12/2014 0820   BILITOT 0.80 07/02/2014 0848   BILITOT 0.5 04/08/2014 1304   BILITOT 0.37 03/12/2014 0820         Impression and Plan: [name] is 69 year old gentleman with metastatic prostate cancer.he completed his radiation therapy. This was back in mid April. I think that the bone scan is reassuring. I told he has wife that is not surprising other still some areas of positivity where he had the radiation. This may be the case for several months.  For [name], the PSA really isn't anything indicator as how he is doing.  We will plan to get him back now in 6 weeks or so. We will do his Lupron and Xgeva we see him back    [name], MD 6/17/20169:42 AM

## 2014-07-03 LAB — PSA: PSA: 0.03 ng/mL (ref <=4.00)

## 2014-07-05 ENCOUNTER — Telehealth: Payer: Self-pay

## 2014-07-05 NOTE — Telephone Encounter (Addendum)
-----   Message from [name], MD sent at 07/05/2014  7:18 AM EDT ----- Call and tell him that the PSA is now down to 0.03. This is fantastic. Thanks  Above message given to pt's wife via phone. Verbalizes understanding and appreciation. dph

## 2014-07-12 DIAGNOSIS — I209 Angina pectoris, unspecified: Secondary | ICD-10-CM | POA: Insufficient documentation

## 2014-07-12 DIAGNOSIS — I10 Essential (primary) hypertension: Secondary | ICD-10-CM | POA: Insufficient documentation

## 2014-07-12 DIAGNOSIS — E785 Hyperlipidemia, unspecified: Secondary | ICD-10-CM | POA: Insufficient documentation

## 2014-07-12 DIAGNOSIS — Z6824 Body mass index (BMI) 24.0-24.9, adult: Secondary | ICD-10-CM | POA: Diagnosis not present

## 2014-07-12 HISTORY — DX: Hyperlipidemia, unspecified: E78.5

## 2014-07-12 HISTORY — DX: Essential (primary) hypertension: I10

## 2014-07-12 HISTORY — DX: Angina pectoris, unspecified: I20.9

## 2014-07-20 ENCOUNTER — Other Ambulatory Visit: Payer: Self-pay | Admitting: Hematology & Oncology

## 2014-07-22 DIAGNOSIS — D485 Neoplasm of uncertain behavior of skin: Secondary | ICD-10-CM | POA: Diagnosis not present

## 2014-07-22 DIAGNOSIS — L821 Other seborrheic keratosis: Secondary | ICD-10-CM | POA: Diagnosis not present

## 2014-07-22 DIAGNOSIS — D2239 Melanocytic nevi of other parts of face: Secondary | ICD-10-CM | POA: Diagnosis not present

## 2014-07-22 DIAGNOSIS — L570 Actinic keratosis: Secondary | ICD-10-CM | POA: Diagnosis not present

## 2014-07-22 DIAGNOSIS — D225 Melanocytic nevi of trunk: Secondary | ICD-10-CM | POA: Diagnosis not present

## 2014-07-22 DIAGNOSIS — D1801 Hemangioma of skin and subcutaneous tissue: Secondary | ICD-10-CM | POA: Diagnosis not present

## 2014-08-12 DIAGNOSIS — E785 Hyperlipidemia, unspecified: Secondary | ICD-10-CM | POA: Diagnosis not present

## 2014-08-13 ENCOUNTER — Other Ambulatory Visit (HOSPITAL_BASED_OUTPATIENT_CLINIC_OR_DEPARTMENT_OTHER): Payer: Medicare Other

## 2014-08-13 ENCOUNTER — Ambulatory Visit: Payer: Medicare Other

## 2014-08-13 ENCOUNTER — Encounter: Payer: Self-pay | Admitting: Family

## 2014-08-13 ENCOUNTER — Ambulatory Visit (HOSPITAL_BASED_OUTPATIENT_CLINIC_OR_DEPARTMENT_OTHER): Payer: Medicare Other | Admitting: Family

## 2014-08-13 VITALS — BP 120/67 | HR 78 | Temp 97.9°F | Resp 18 | Ht 69.0 in | Wt 161.0 lb

## 2014-08-13 DIAGNOSIS — C61 Malignant neoplasm of prostate: Secondary | ICD-10-CM

## 2014-08-13 LAB — CBC WITH DIFFERENTIAL (CANCER CENTER ONLY)
BASO#: 0 10*3/uL (ref 0.0–0.2)
BASO%: 0.4 % (ref 0.0–2.0)
EOS%: 5.6 % (ref 0.0–7.0)
Eosinophils Absolute: 0.3 10*3/uL (ref 0.0–0.5)
HCT: 34.3 % — ABNORMAL LOW (ref 38.7–49.9)
HGB: 11.5 g/dL — ABNORMAL LOW (ref 13.0–17.1)
LYMPH#: 1.2 10*3/uL (ref 0.9–3.3)
LYMPH%: 20.6 % (ref 14.0–48.0)
MCH: 32.7 pg (ref 28.0–33.4)
MCHC: 33.5 g/dL (ref 32.0–35.9)
MCV: 97 fL (ref 82–98)
MONO#: 0.4 10*3/uL (ref 0.1–0.9)
MONO%: 6.9 % (ref 0.0–13.0)
NEUT#: 3.8 10*3/uL (ref 1.5–6.5)
NEUT%: 66.5 % (ref 40.0–80.0)
Platelets: 213 10*3/uL (ref 145–400)
RBC: 3.52 10*6/uL — ABNORMAL LOW (ref 4.20–5.70)
RDW: 12.8 % (ref 11.1–15.7)
WBC: 5.7 10*3/uL (ref 4.0–10.0)

## 2014-08-13 LAB — CMP (CANCER CENTER ONLY)
ALT(SGPT): 12 U/L (ref 10–47)
AST: 20 U/L (ref 11–38)
Albumin: 3.4 g/dL (ref 3.3–5.5)
Alkaline Phosphatase: 71 U/L (ref 26–84)
BUN, Bld: 19 mg/dL (ref 7–22)
CO2: 28 mEq/L (ref 18–33)
Calcium: 9.3 mg/dL (ref 8.0–10.3)
Chloride: 106 mEq/L (ref 98–108)
Creat: 1.2 mg/dl (ref 0.6–1.2)
Glucose, Bld: 151 mg/dL — ABNORMAL HIGH (ref 73–118)
Potassium: 3.6 mEq/L (ref 3.3–4.7)
Sodium: 141 mEq/L (ref 128–145)
Total Bilirubin: 0.6 mg/dl (ref 0.20–1.60)
Total Protein: 6.5 g/dL (ref 6.4–8.1)

## 2014-08-13 NOTE — Progress Notes (Signed)
Hematology and Oncology Follow Up Visit  Stpehen Petitjean [id number] 09/02/1945 69 y.o. 08/13/2014   Principle Diagnosis:  Metastatic castrate resistant prostate cancer  Current Therapy:   Zytiga 1000 mg by mouth daily Xgeva 120 mg subcutaneous Q3 months - due in August Lupron 22 mg IM every 3 months - due In August    Interim History:  Mr. Allinson is here today with his wife for a follow-up. He is doing well and has no complaints today. Since starting Imdur he is no longer having leg pain. His hip pain has also improved.  His creatinine is back within normal range. He has had no problems urinating. No blood in his urine.  His PSA in June was 0.03. We rechecked this today.  His states that his blood sugars are well controlled. He is on glimepride and metformin daily. He has had no problem with infections. No fever, chills, n/v, cough, rash, dizziness, SOB, chest pain, palpitations, abdominal pain, changes in bowel or bladder habits. No blood in the stool.  He has had no swelling, tenderness, numbness or tingling in his extremities. No new aches or pains. No c/o "boney" pain.  He has a healthy appetite and is staying well hydrated. His weight is stable.    Medications:    Medication List       This list is accurate as of: 08/13/14 10:34 AM.  Always use your most recent med list.               aspirin 81 MG tablet  Take 81 mg by mouth daily.     CALCIUM & VIT D3 BONE HEALTH PO  Take by mouth every morning.     CALCIUM + D PO  Take 500 mg by mouth 2 (two) times daily.     gabapentin 100 MG capsule  Commonly known as:  NEURONTIN  TAKE 1 CAPSULE BY MOUTH EVERY 8 HOURS     glimepiride 1 MG tablet  Commonly known as:  AMARYL  TAKE TWO TABLETS BY MOUTH DAILY WITH BREAKFAST     isosorbide mononitrate 30 MG 24 hr tablet  Commonly known as:  IMDUR  Take 30 mg by mouth daily.     metFORMIN 500 MG 24 hr tablet  Commonly known as:  GLUCOPHAGE XR  Take 2 tablets (1,000 mg total)  by mouth daily with breakfast.     MIRALAX powder  Generic drug:  polyethylene glycol powder  Take 17 g by mouth daily.     OVER THE COUNTER MEDICATION  every morning. Juice Plus     predniSONE 5 MG tablet  Commonly known as:  DELTASONE  TAKE 1 TABLET BY MOUTH ONCE DAILY     traZODone 50 MG tablet  Commonly known as:  DESYREL  bedtime as needed     VESICARE 5 MG tablet  Generic drug:  solifenacin  HOW TAKES WEEKLY ONLY     ZETIA 10 MG tablet  Generic drug:  ezetimibe  10 mg daily.     ZYTIGA 250 MG tablet  Generic drug:  abiraterone Acetate  TAKE 4 TABLETS BY MOUTH DAILY. TAKE ON AN EMPTY STOMACH 1 HOUR BEFORE OR 2 HOURS AFTER A MEAL        Allergies:  Allergies  Allergen Reactions  . Codeine Nausea And Vomiting  . Hydrocodone Nausea And Vomiting    Past Medical History, Surgical history, Social history, and Family History were reviewed and updated.  Review of Systems: All other 10 point review of  systems is negative.   Physical Exam:  height is '5\' 9"'$  (1.753 m) and weight is 161 lb (73.029 kg). His oral temperature is 97.9 F (36.6 C). His blood pressure is 120/67 and his pulse is 78. His respiration is 18.   Wt Readings from Last 3 Encounters:  08/13/14 161 lb (73.029 kg)  07/02/14 158 lb 8 oz (71.895 kg)  06/10/14 159 lb 4.8 oz (72.258 kg)    Ocular: Sclerae unicteric, pupils equal, round and reactive to light Ear-nose-throat: Oropharynx clear, dentition fair Lymphatic: No cervical or supraclavicular adenopathy Lungs no rales or rhonchi, good excursion bilaterally Heart regular rate and rhythm, no murmur appreciated Abd soft, nontender, positive bowel sounds MSK no focal spinal tenderness, no joint edema Neuro: non-focal, well-oriented, appropriate affect Breasts: Deferred  Lab Results  Component Value Date   WBC 5.7 08/13/2014   HGB 11.5* 08/13/2014   HCT 34.3* 08/13/2014   MCV 97 08/13/2014   PLT 213 08/13/2014   No results found for:  FERRITIN, IRON, TIBC, UIBC, IRONPCTSAT Lab Results  Component Value Date   RBC 3.52* 08/13/2014   No results found for: KPAFRELGTCHN, LAMBDASER, KAPLAMBRATIO No results found for: IGGSERUM, IGA, IGMSERUM No results found for: Kathrynn Ducking, MSPIKE, SPEI   Chemistry      Component Value Date/Time   NA 141 08/13/2014 0852   NA 137 04/08/2014 1304   NA 140 03/12/2014 0820   K 3.6 08/13/2014 0852   K 4.1 04/08/2014 1304   K 3.9 03/12/2014 0820   CL 106 08/13/2014 0852   CL 100 04/08/2014 1304   CO2 28 08/13/2014 0852   CO2 27 04/08/2014 1304   CO2 26 03/12/2014 0820   BUN 19 08/13/2014 0852   BUN 12 04/08/2014 1304   BUN 18.0 03/12/2014 0820   CREATININE 1.2 08/13/2014 0852   CREATININE 0.96 04/08/2014 1304   CREATININE 1.1 03/12/2014 0820      Component Value Date/Time   CALCIUM 9.3 08/13/2014 0852   CALCIUM 9.4 04/08/2014 1304   CALCIUM 9.5 03/12/2014 0820   ALKPHOS 71 08/13/2014 0852   ALKPHOS 57 04/08/2014 1304   ALKPHOS 74 03/12/2014 0820   AST 20 08/13/2014 0852   AST 15 04/08/2014 1304   AST 15 03/12/2014 0820   ALT 12 08/13/2014 0852   ALT 12 04/08/2014 1304   ALT 9 03/12/2014 0820   BILITOT 0.60 08/13/2014 0852   BILITOT 0.5 04/08/2014 1304   BILITOT 0.37 03/12/2014 0820     Impression and Plan: Mr. Toro is 69 year old gentleman with metastatic prostate cancer.he completed his radiation therapy in April. He is doing well and is asymptomatic at this time.  He will continue to receive Lupron and Xgeva every 3 months. These are due in August. His PSA today is pending. His level in June was 0.03.  We will plan to see him back in 5 weeks for labs, follow-up and injections.  He knows to call with any questions or concerns. We can certainly see him sooner if need be.   Eliezer Bottom, NP 7/29/201610:34 AM

## 2014-08-14 LAB — PSA: PSA: 0.01 ng/mL (ref <=4.00)

## 2014-08-16 ENCOUNTER — Encounter: Payer: Self-pay | Admitting: Nurse Practitioner

## 2014-09-14 ENCOUNTER — Other Ambulatory Visit: Payer: Medicare Other

## 2014-09-14 ENCOUNTER — Ambulatory Visit: Payer: Medicare Other

## 2014-09-14 ENCOUNTER — Ambulatory Visit: Payer: Medicare Other | Admitting: Hematology & Oncology

## 2014-09-17 ENCOUNTER — Ambulatory Visit (HOSPITAL_BASED_OUTPATIENT_CLINIC_OR_DEPARTMENT_OTHER): Payer: Medicare Other | Admitting: Hematology & Oncology

## 2014-09-17 ENCOUNTER — Ambulatory Visit (HOSPITAL_BASED_OUTPATIENT_CLINIC_OR_DEPARTMENT_OTHER): Payer: Medicare Other

## 2014-09-17 ENCOUNTER — Other Ambulatory Visit (HOSPITAL_BASED_OUTPATIENT_CLINIC_OR_DEPARTMENT_OTHER): Payer: Medicare Other

## 2014-09-17 ENCOUNTER — Encounter: Payer: Self-pay | Admitting: Hematology & Oncology

## 2014-09-17 VITALS — BP 137/68 | HR 73 | Temp 97.9°F | Resp 18 | Ht 69.0 in | Wt 159.0 lb

## 2014-09-17 DIAGNOSIS — C7951 Secondary malignant neoplasm of bone: Secondary | ICD-10-CM | POA: Diagnosis not present

## 2014-09-17 DIAGNOSIS — C61 Malignant neoplasm of prostate: Secondary | ICD-10-CM

## 2014-09-17 DIAGNOSIS — Z5111 Encounter for antineoplastic chemotherapy: Secondary | ICD-10-CM | POA: Diagnosis present

## 2014-09-17 LAB — CBC WITH DIFFERENTIAL (CANCER CENTER ONLY)
BASO#: 0 10*3/uL (ref 0.0–0.2)
BASO%: 0.3 % (ref 0.0–2.0)
EOS%: 1.5 % (ref 0.0–7.0)
Eosinophils Absolute: 0.1 10*3/uL (ref 0.0–0.5)
HCT: 37.9 % — ABNORMAL LOW (ref 38.7–49.9)
HGB: 12.4 g/dL — ABNORMAL LOW (ref 13.0–17.1)
LYMPH#: 1.2 10*3/uL (ref 0.9–3.3)
LYMPH%: 15.5 % (ref 14.0–48.0)
MCH: 31.8 pg (ref 28.0–33.4)
MCHC: 32.7 g/dL (ref 32.0–35.9)
MCV: 97 fL (ref 82–98)
MONO#: 0.5 10*3/uL (ref 0.1–0.9)
MONO%: 6 % (ref 0.0–13.0)
NEUT#: 5.8 10*3/uL (ref 1.5–6.5)
NEUT%: 76.7 % (ref 40.0–80.0)
Platelets: 213 10*3/uL (ref 145–400)
RBC: 3.9 10*6/uL — ABNORMAL LOW (ref 4.20–5.70)
RDW: 12.6 % (ref 11.1–15.7)
WBC: 7.5 10*3/uL (ref 4.0–10.0)

## 2014-09-17 MED ORDER — DENOSUMAB 120 MG/1.7ML ~~LOC~~ SOLN
120.0000 mg | Freq: Once | SUBCUTANEOUS | Status: AC
Start: 2014-09-17 — End: 2014-09-17
  Administered 2014-09-17: 120 mg via SUBCUTANEOUS
  Filled 2014-09-17: qty 1.7

## 2014-09-17 MED ORDER — LEUPROLIDE ACETATE (3 MONTH) 22.5 MG IM KIT
22.5000 mg | PACK | Freq: Once | INTRAMUSCULAR | Status: AC
  Administered 2014-09-17: 22.5 mg via INTRAMUSCULAR
  Filled 2014-09-17: qty 22.5

## 2014-09-17 NOTE — Progress Notes (Signed)
Hematology and Oncology Follow Up Visit  [name] [id number] November 26, 1945 69 y.o. 09/17/2014   Principle Diagnosis:   Metastatic castrate resistant prostate cancer  Current Therapy:    Zytiga 1000 mg by mouth daily  Xgeva 120 mg subcutaneous Q3 months - due in  December  Lupron 22 mg IM every 3 months - due  In December     Interim History:  Mr.  [name] is back for followup. He is doing  Better. He feels well. He does not complain of any pain.    he is doing well with the Zytiga.  His last PSA was,, amazingly, down to 0.01.  He gets his Lupron and Xgeva today.   He's had no further chest discomfort he has been seen by cardiology. They did do not think that he needs any type of workup.   He is still working. He is not slowing down. He prelunch is still working full-time.  He is watching his blood sugars. His wife is doing a great job managing his blood sugars for him.   His performance status is ECOG 1.   Medications:  Current outpatient prescriptions:  .  aspirin 81 MG tablet, Take 81 mg by mouth daily., Disp: , Rfl:  .  Calcium Carbonate-Vitamin D (CALCIUM + D PO), Take 500 mg by mouth 2 (two) times daily., Disp: , Rfl:  .  gabapentin (NEURONTIN) 100 MG capsule, TAKE 1 CAPSULE BY MOUTH EVERY 8 HOURS (Patient taking differently: TAKE 1 CAPSULE BY MOUTH EVERY 12 HOURS), Disp: 90 capsule, Rfl: 6 .  glimepiride (AMARYL) 1 MG tablet, TAKE TWO TABLETS BY MOUTH DAILY WITH BREAKFAST, Disp: 60 tablet, Rfl: 4 .  isosorbide mononitrate (IMDUR) 30 MG 24 hr tablet, Take 30 mg by mouth daily., Disp: , Rfl:  .  metFORMIN (GLUCOPHAGE XR) 500 MG 24 hr tablet, Take 2 tablets (1,000 mg total) by mouth daily with breakfast. (Patient taking differently: Take 1,000 mg by mouth daily with breakfast. ), Disp: 60 tablet, Rfl: 4 .  Multiple Minerals-Vitamins (CALCIUM & VIT D3 BONE HEALTH PO), Take by mouth every morning. , Disp: , Rfl:  .  OVER THE COUNTER MEDICATION, every morning. Juice Plus,  Disp: , Rfl:  .  polyethylene glycol powder (MIRALAX) powder, Take 17 g by mouth daily., Disp: , Rfl:  .  predniSONE (DELTASONE) 5 MG tablet, TAKE 1 TABLET BY MOUTH ONCE DAILY, Disp: 60 tablet, Rfl: 0 .  solifenacin (VESICARE) 5 MG tablet, HOW TAKES WEEKLY ONLY, Disp: , Rfl:  .  traZODone (DESYREL) 50 MG tablet, bedtime as needed, Disp: , Rfl:  .  ZETIA 10 MG tablet, 10 mg daily., Disp: , Rfl:  .  ZYTIGA 250 MG tablet, TAKE 4 TABLETS BY MOUTH DAILY. TAKE ON AN EMPTY STOMACH 1 HOUR BEFORE OR 2 HOURS AFTER A MEAL, Disp: 120 tablet, Rfl: 0  Allergies:  Allergies  Allergen Reactions  . Codeine Nausea And Vomiting  . Hydrocodone Nausea And Vomiting    Past Medical History, Surgical history, Social history, and Family History were reviewed and updated.  Review of Systems: As above  Physical Exam:  height is '5\' 9"'$  (1.753 m) and weight is 159 lb (72.122 kg). His oral temperature is 97.9 F (36.6 C). His blood pressure is 137/68 and his pulse is 73. His respiration is 18.   Well-developed and well-nourished white male. Head and neck exam shows no ocular or oral lesions. The are no palpable cervical or supraclavicular lymph nodes. Lungs are clear with  no rales, wheezes or rhonchi. Cardiac exam regular rate and rhythm with no murmurs rubs or bruits. Abdomen is soft. He has good bowel sounds. There is no fluid wave. There is no palpable liver or spleen tip. Back exam shows no tenderness over the spine ribs or hips. I cannot elicit any tenderness over the sacrum bilaterally. He has good range of motion of his pelvis. Extremities shows no clubbing, cyanosis or edema. Skin exam shows areas of urticaria on the left forearm.. There is also an area as solitary in the upper chest wall in the upper sternal region. These are nontender. There are erythematous with a central area of vesicle. No erythema to the skin is noted. Neurological exam is nonfocal.   Lab Results  Component Value Date   WBC 7.5  09/17/2014   HGB 12.4* 09/17/2014   HCT 37.9* 09/17/2014   MCV 97 09/17/2014   PLT 213 09/17/2014     Chemistry      Component Value Date/Time   NA 141 08/13/2014 0852   NA 137 04/08/2014 1304   NA 140 03/12/2014 0820   K 3.6 08/13/2014 0852   K 4.1 04/08/2014 1304   K 3.9 03/12/2014 0820   CL 106 08/13/2014 0852   CL 100 04/08/2014 1304   CO2 28 08/13/2014 0852   CO2 27 04/08/2014 1304   CO2 26 03/12/2014 0820   BUN 19 08/13/2014 0852   BUN 12 04/08/2014 1304   BUN 18.0 03/12/2014 0820   CREATININE 1.2 08/13/2014 0852   CREATININE 0.96 04/08/2014 1304   CREATININE 1.1 03/12/2014 0820      Component Value Date/Time   CALCIUM 9.3 08/13/2014 0852   CALCIUM 9.4 04/08/2014 1304   CALCIUM 9.5 03/12/2014 0820   ALKPHOS 71 08/13/2014 0852   ALKPHOS 57 04/08/2014 1304   ALKPHOS 74 03/12/2014 0820   AST 20 08/13/2014 0852   AST 15 04/08/2014 1304   AST 15 03/12/2014 0820   ALT 12 08/13/2014 0852   ALT 12 04/08/2014 1304   ALT 9 03/12/2014 0820   BILITOT 0.60 08/13/2014 0852   BILITOT 0.5 04/08/2014 1304   BILITOT 0.37 03/12/2014 0820         Impression and Plan: Mr. [name] is 69 year old gentleman with metastatic prostate cancer.he completed his radiation therapy. This was back in mid April.   I am just amaze that his PSA has dropped so much. We will see what it is today.  He will get his Delton See and Lupron today. He then will weight 3 months until he gets his next dose.   I will plan to see him back in 6 weeks.    [name], MD 9/2/20164:50 PM

## 2014-09-17 NOTE — Patient Instructions (Signed)
Denosumab injection What is this medicine? DENOSUMAB (den oh sue mab) slows bone breakdown. Prolia is used to treat osteoporosis in women after menopause and in men. Xgeva is used to prevent bone fractures and other bone problems caused by cancer bone metastases. Xgeva is also used to treat giant cell tumor of the bone. This medicine may be used for other purposes; ask your health care provider or pharmacist if you have questions. COMMON BRAND NAME(S): Prolia, XGEVA What should I tell my health care provider before I take this medicine? They need to know if you have any of these conditions: -dental disease -eczema -infection or history of infections -kidney disease or on dialysis -low blood calcium or vitamin D -malabsorption syndrome -scheduled to have surgery or tooth extraction -taking medicine that contains denosumab -thyroid or parathyroid disease -an unusual reaction to denosumab, other medicines, foods, dyes, or preservatives -pregnant or trying to get pregnant -breast-feeding How should I use this medicine? This medicine is for injection under the skin. It is given by a health care professional in a hospital or clinic setting. If you are getting Prolia, a special MedGuide will be given to you by the pharmacist with each prescription and refill. Be sure to read this information carefully each time. For Prolia, talk to your pediatrician regarding the use of this medicine in children. Special care may be needed. For Xgeva, talk to your pediatrician regarding the use of this medicine in children. While this drug may be prescribed for children as young as 13 years for selected conditions, precautions do apply. Overdosage: If you think you've taken too much of this medicine contact a poison control center or emergency room at once. Overdosage: If you think you have taken too much of this medicine contact a poison control center or emergency room at once. NOTE: This medicine is only for  you. Do not share this medicine with others. What if I miss a dose? It is important not to miss your dose. Call your doctor or health care professional if you are unable to keep an appointment. What may interact with this medicine? Do not take this medicine with any of the following medications: -other medicines containing denosumab This medicine may also interact with the following medications: -medicines that suppress the immune system -medicines that treat cancer -steroid medicines like prednisone or cortisone This list may not describe all possible interactions. Give your health care provider a list of all the medicines, herbs, non-prescription drugs, or dietary supplements you use. Also tell them if you smoke, drink alcohol, or use illegal drugs. Some items may interact with your medicine. What should I watch for while using this medicine? Visit your doctor or health care professional for regular checks on your progress. Your doctor or health care professional may order blood tests and other tests to see how you are doing. Call your doctor or health care professional if you get a cold or other infection while receiving this medicine. Do not treat yourself. This medicine may decrease your body's ability to fight infection. You should make sure you get enough calcium and vitamin D while you are taking this medicine, unless your doctor tells you not to. Discuss the foods you eat and the vitamins you take with your health care professional. See your dentist regularly. Brush and floss your teeth as directed. Before you have any dental work done, tell your dentist you are receiving this medicine. Do not become pregnant while taking this medicine or for 5 months after stopping   it. Women should inform their doctor if they wish to become pregnant or think they might be pregnant. There is a potential for serious side effects to an unborn child. Talk to your health care professional or pharmacist for more  information. What side effects may I notice from receiving this medicine? Side effects that you should report to your doctor or health care professional as soon as possible: -allergic reactions like skin rash, itching or hives, swelling of the face, lips, or tongue -breathing problems -chest pain -fast, irregular heartbeat -feeling faint or lightheaded, falls -fever, chills, or any other sign of infection -muscle spasms, tightening, or twitches -numbness or tingling -skin blisters or bumps, or is dry, peels, or red -slow healing or unexplained pain in the mouth or jaw -unusual bleeding or bruising Side effects that usually do not require medical attention (Report these to your doctor or health care professional if they continue or are bothersome.): -muscle pain -stomach upset, gas This list may not describe all possible side effects. Call your doctor for medical advice about side effects. You may report side effects to FDA at 1-800-FDA-1088. Where should I keep my medicine? This medicine is only given in a clinic, doctor's office, or other health care setting and will not be stored at home. NOTE: This sheet is a summary. It may not cover all possible information. If you have questions about this medicine, talk to your doctor, pharmacist, or health care provider.  2015, [employer] Standard. (2011-07-02 12:37:47) Leuprolide injection What is this medicine? LEUPROLIDE (loo PROE lide) is a man-made hormone. It is used to treat the symptoms of prostate cancer. This medicine may also be used to treat children with early onset of puberty. It may be used for other hormonal conditions. This medicine may be used for other purposes; ask your health care provider or pharmacist if you have questions. COMMON BRAND NAME(S): Lupron What should I tell my health care provider before I take this medicine? They need to know if you have any of these conditions: -diabetes -heart disease or previous heart  attack -high blood pressure -high cholesterol -pain or difficulty passing urine -spinal cord metastasis -stroke -tobacco smoker -an unusual or allergic reaction to leuprolide, benzyl alcohol, other medicines, foods, dyes, or preservatives -pregnant or trying to get pregnant -breast-feeding How should I use this medicine? This medicine is for injection under the skin or into a muscle. You will be taught how to prepare and give this medicine. Use exactly as directed. Take your medicine at regular intervals. Do not take your medicine more often than directed. It is important that you put your used needles and syringes in a special sharps container. Do not put them in a trash can. If you do not have a sharps container, call your pharmacist or healthcare provider to get one. Talk to your pediatrician regarding the use of this medicine in children. While this medicine may be prescribed for children as young as 8 years for selected conditions, precautions do apply. Overdosage: If you think you have taken too much of this medicine contact a poison control center or emergency room at once. NOTE: This medicine is only for you. Do not share this medicine with others. What if I miss a dose? If you miss a dose, take it as soon as you can. If it is almost time for your next dose, take only that dose. Do not take double or extra doses. What may interact with this medicine? Do not take this medicine with   any of the following medications: -chasteberry This medicine may also interact with the following medications: -herbal or dietary supplements, like black cohosh or DHEA -male hormones, like estrogens or progestins and birth control pills, patches, rings, or injections -male hormones, like testosterone This list may not describe all possible interactions. Give your health care provider a list of all the medicines, herbs, non-prescription drugs, or dietary supplements you use. Also tell them if you smoke,  drink alcohol, or use illegal drugs. Some items may interact with your medicine. What should I watch for while using this medicine? Visit your doctor or health care professional for regular checks on your progress. During the first week, your symptoms may get worse, but then will improve as you continue your treatment. You may get hot flashes, increased bone pain, increased difficulty passing urine, or an aggravation of nerve symptoms. Discuss these effects with your doctor or health care professional, some of them may improve with continued use of this medicine. Male patients may experience a menstrual cycle or spotting during the first 2 months of therapy with this medicine. If this continues, contact your doctor or health care professional. What side effects may I notice from receiving this medicine? Side effects that you should report to your doctor or health care professional as soon as possible: -allergic reactions like skin rash, itching or hives, swelling of the face, lips, or tongue -breathing problems -chest pain -depression or memory disorders -pain in your legs or groin -pain at site where injected -severe headache -swelling of the feet and legs -visual changes -vomiting Side effects that usually do not require medical attention (report to your doctor or health care professional if they continue or are bothersome): -breast swelling or tenderness -decrease in sex drive or performance -diarrhea -hot flashes -loss of appetite -muscle, joint, or bone pains -nausea -redness or irritation at site where injected -skin problems or acne This list may not describe all possible side effects. Call your doctor for medical advice about side effects. You may report side effects to FDA at 1-800-FDA-1088. Where should I keep my medicine? Keep out of the reach of children. Store below 25 degrees C (77 degrees F). Do not freeze. Protect from light. Do not use if it is not clear or if there  are particles present. Throw away any unused medicine after the expiration date. NOTE: This sheet is a summary. It may not cover all possible information. If you have questions about this medicine, talk to your doctor, pharmacist, or health care provider.  2015, [employer]/Gold Standard. (2008-05-18 13:26:20)  

## 2014-09-18 LAB — COMPREHENSIVE METABOLIC PANEL
ALT: 12 U/L (ref 9–46)
AST: 14 U/L (ref 10–35)
Albumin: 4 g/dL (ref 3.6–5.1)
Alkaline Phosphatase: 54 U/L (ref 40–115)
BUN: 15 mg/dL (ref 7–25)
CO2: 30 mmol/L (ref 20–31)
Calcium: 9.8 mg/dL (ref 8.6–10.3)
Chloride: 102 mmol/L (ref 98–110)
Creatinine, Ser: 1.05 mg/dL (ref 0.70–1.25)
Glucose, Bld: 132 mg/dL — ABNORMAL HIGH (ref 65–99)
Potassium: 4.5 mmol/L (ref 3.5–5.3)
Sodium: 141 mmol/L (ref 135–146)
Total Bilirubin: 0.3 mg/dL (ref 0.2–1.2)
Total Protein: 6.2 g/dL (ref 6.1–8.1)

## 2014-09-18 LAB — PSA: PSA: 0.01 ng/mL (ref <=4.00)

## 2014-10-04 DIAGNOSIS — H8113 Benign paroxysmal vertigo, bilateral: Secondary | ICD-10-CM | POA: Diagnosis not present

## 2014-10-05 ENCOUNTER — Ambulatory Visit (HOSPITAL_BASED_OUTPATIENT_CLINIC_OR_DEPARTMENT_OTHER): Payer: Medicare Other | Admitting: Family

## 2014-10-05 ENCOUNTER — Encounter: Payer: Self-pay | Admitting: Family

## 2014-10-05 ENCOUNTER — Encounter (HOSPITAL_BASED_OUTPATIENT_CLINIC_OR_DEPARTMENT_OTHER): Payer: Self-pay

## 2014-10-05 ENCOUNTER — Other Ambulatory Visit (HOSPITAL_BASED_OUTPATIENT_CLINIC_OR_DEPARTMENT_OTHER): Payer: Medicare Other

## 2014-10-05 ENCOUNTER — Ambulatory Visit (HOSPITAL_BASED_OUTPATIENT_CLINIC_OR_DEPARTMENT_OTHER)
Admission: RE | Admit: 2014-10-05 | Discharge: 2014-10-05 | Disposition: A | Discharge status: Home / Self Care | Payer: Medicare Other | Source: Ambulatory Visit | Attending: Family | Admitting: Family

## 2014-10-05 ENCOUNTER — Other Ambulatory Visit: Payer: Self-pay | Admitting: Family

## 2014-10-05 VITALS — BP 130/68 | HR 88 | Temp 97.9°F | Resp 16 | Ht 69.0 in | Wt 157.0 lb

## 2014-10-05 DIAGNOSIS — R42 Dizziness and giddiness: Secondary | ICD-10-CM

## 2014-10-05 DIAGNOSIS — C61 Malignant neoplasm of prostate: Secondary | ICD-10-CM

## 2014-10-05 DIAGNOSIS — I6789 Other cerebrovascular disease: Secondary | ICD-10-CM | POA: Diagnosis not present

## 2014-10-05 LAB — CBC WITH DIFFERENTIAL (CANCER CENTER ONLY)
BASO#: 0 10*3/uL (ref 0.0–0.2)
BASO%: 0.4 % (ref 0.0–2.0)
EOS%: 3.7 % (ref 0.0–7.0)
Eosinophils Absolute: 0.2 10*3/uL (ref 0.0–0.5)
HCT: 38.4 % — ABNORMAL LOW (ref 38.7–49.9)
HGB: 12.7 g/dL — ABNORMAL LOW (ref 13.0–17.1)
LYMPH#: 1.1 10*3/uL (ref 0.9–3.3)
LYMPH%: 20.2 % (ref 14.0–48.0)
MCH: 32 pg (ref 28.0–33.4)
MCHC: 33.1 g/dL (ref 32.0–35.9)
MCV: 97 fL (ref 82–98)
MONO#: 0.4 10*3/uL (ref 0.1–0.9)
MONO%: 6.6 % (ref 0.0–13.0)
NEUT#: 3.9 10*3/uL (ref 1.5–6.5)
NEUT%: 69.1 % (ref 40.0–80.0)
Platelets: 218 10*3/uL (ref 145–400)
RBC: 3.97 10*6/uL — ABNORMAL LOW (ref 4.20–5.70)
RDW: 12.4 % (ref 11.1–15.7)
WBC: 5.6 10*3/uL (ref 4.0–10.0)

## 2014-10-05 LAB — COMPREHENSIVE METABOLIC PANEL (CC13)
ALT: 10 U/L (ref 0–55)
AST: 13 U/L (ref 5–34)
Albumin: 3.8 g/dL (ref 3.5–5.0)
Alkaline Phosphatase: 59 U/L (ref 40–150)
Anion Gap: 8 mEq/L (ref 3–11)
BUN: 15.3 mg/dL (ref 7.0–26.0)
CO2: 29 mEq/L (ref 22–29)
Calcium: 9.9 mg/dL (ref 8.4–10.4)
Chloride: 104 mEq/L (ref 98–109)
Creatinine: 1.2 mg/dL (ref 0.7–1.3)
EGFR: 61 mL/min/{1.73_m2} — ABNORMAL LOW (ref >90)
Glucose: 201 mg/dl — ABNORMAL HIGH (ref 70–140)
Potassium: 3.8 mEq/L (ref 3.5–5.1)
Sodium: 141 mEq/L (ref 136–145)
Total Bilirubin: 0.5 mg/dL (ref 0.20–1.20)
Total Protein: 6.6 g/dL (ref 6.4–8.3)

## 2014-10-05 MED ORDER — IOHEXOL 300 MG/ML  SOLN
75.0000 mL | Freq: Once | INTRAMUSCULAR | Status: AC | PRN
  Administered 2014-10-05: 75 mL via INTRAVENOUS

## 2014-10-05 NOTE — Progress Notes (Signed)
Hematology and Oncology Follow Up Visit  [name] [id number] 1945-08-03 69 y.o. 10/05/2014   Principle Diagnosis:  Metastatic castrate resistant prostate cancer  Current Therapy:   Zytiga 1000 mg by mouth daily Xgeva 120 mg subcutaneous Q3 months - due in Decmber Lupron 22 mg IM every 3 months - due in December     Interim History:  [name] is here today with his wife for a follow-up. He had episodes of dizziness with n/v over the weekend. He did see his PCP and is now taking Antivert as needed.  He states that if he tried to sit up "the whole room would spin."  His PSA in June was 0.01. We rechecked this today.  He has had no problem with his blood sugars. He is still on glimepride and metformin daily. He has a small "scaley patch" of skin above his left eyebrow since last week. It no longer itches. He plans to follow-up with his dermatologist if this has not cleared up in the next week.  No fever, chills, cough, rash, SOB, chest pain, palpitations, abdominal pain, changes in bowel or bladder habits. No blood in the stool.  He has had no swelling, tenderness, numbness or tingling in his extremities. No c/o pain at this time.  He is eating well and staying hydrated. His weight is down 2 lbs since his last visit.   Medications:    Medication List       This list is accurate as of: 10/05/14 11:01 AM.  Always use your most recent med list.               aspirin 81 MG tablet  Take 81 mg by mouth daily.     CALCIUM & VIT D3 BONE HEALTH PO  Take by mouth every morning.     CALCIUM + D PO  Take 500 mg by mouth 2 (two) times daily.     gabapentin 100 MG capsule  Commonly known as:  NEURONTIN  TAKE 1 CAPSULE BY MOUTH EVERY 8 HOURS     glimepiride 1 MG tablet  Commonly known as:  AMARYL  TAKE TWO TABLETS BY MOUTH DAILY WITH BREAKFAST     isosorbide mononitrate 30 MG 24 hr tablet  Commonly known as:  IMDUR  Take 30 mg by mouth daily.     meclizine 25 MG tablet    Commonly known as:  ANTIVERT     metFORMIN 500 MG 24 hr tablet  Commonly known as:  GLUCOPHAGE XR  Take 2 tablets (1,000 mg total) by mouth daily with breakfast.     MIRALAX powder  Generic drug:  polyethylene glycol powder  Take 17 g by mouth daily.     OVER THE COUNTER MEDICATION  every morning. Juice Plus     predniSONE 5 MG tablet  Commonly known as:  DELTASONE  TAKE 1 TABLET BY MOUTH ONCE DAILY     traZODone 50 MG tablet  Commonly known as:  DESYREL  bedtime as needed     VESICARE 5 MG tablet  Generic drug:  solifenacin  HOW TAKES WEEKLY ONLY     ZETIA 10 MG tablet  Generic drug:  ezetimibe  10 mg daily.     ZYTIGA 250 MG tablet  Generic drug:  abiraterone Acetate  TAKE 4 TABLETS BY MOUTH DAILY. TAKE ON AN EMPTY STOMACH 1 HOUR BEFORE OR 2 HOURS AFTER A MEAL        Allergies:  Allergies  Allergen Reactions  .  Codeine Nausea And Vomiting  . Hydrocodone Nausea And Vomiting    Past Medical History, Surgical history, Social history, and Family History were reviewed and updated.  Review of Systems: All other 10 point review of systems is negative.   Physical Exam:  height is '5\' 9"'$  (1.753 m) and weight is 157 lb (71.215 kg). His oral temperature is 97.9 F (36.6 C). His blood pressure is 130/68 and his pulse is 88. His respiration is 16.   Wt Readings from Last 3 Encounters:  10/05/14 157 lb (71.215 kg)  09/17/14 159 lb (72.122 kg)  08/13/14 161 lb (73.029 kg)    Ocular: Sclerae unicteric, pupils equal, round and reactive to light Ear-nose-throat: Oropharynx clear, dentition fair Lymphatic: No cervical or supraclavicular adenopathy Lungs no rales or rhonchi, good excursion bilaterally Heart regular rate and rhythm, no murmur appreciated Abd soft, nontender, positive bowel sounds MSK no focal spinal tenderness, no joint edema Neuro: non-focal, well-oriented, appropriate affect Breasts: Deferred  Lab Results  Component Value Date   WBC 5.6  10/05/2014   HGB 12.7* 10/05/2014   HCT 38.4* 10/05/2014   MCV 97 10/05/2014   PLT 218 10/05/2014   No results found for: FERRITIN, IRON, TIBC, UIBC, IRONPCTSAT Lab Results  Component Value Date   RBC 3.97* 10/05/2014   No results found for: KPAFRELGTCHN, LAMBDASER, KAPLAMBRATIO No results found for: IGGSERUM, IGA, IGMSERUM No results found for: Kathrynn Ducking, MSPIKE, SPEI   Chemistry      Component Value Date/Time   NA 141 09/17/2014 1423   NA 141 08/13/2014 0852   NA 140 03/12/2014 0820   K 4.5 09/17/2014 1423   K 3.6 08/13/2014 0852   K 3.9 03/12/2014 0820   CL 102 09/17/2014 1423   CL 106 08/13/2014 0852   CO2 30 09/17/2014 1423   CO2 28 08/13/2014 0852   CO2 26 03/12/2014 0820   BUN 15 09/17/2014 1423   BUN 19 08/13/2014 0852   BUN 18.0 03/12/2014 0820   CREATININE 1.05 09/17/2014 1423   CREATININE 1.2 08/13/2014 0852   CREATININE 1.1 03/12/2014 0820      Component Value Date/Time   CALCIUM 9.8 09/17/2014 1423   CALCIUM 9.3 08/13/2014 0852   CALCIUM 9.5 03/12/2014 0820   ALKPHOS 54 09/17/2014 1423   ALKPHOS 71 08/13/2014 0852   ALKPHOS 74 03/12/2014 0820   AST 14 09/17/2014 1423   AST 20 08/13/2014 0852   AST 15 03/12/2014 0820   ALT 12 09/17/2014 1423   ALT 12 08/13/2014 0852   ALT 9 03/12/2014 0820   BILITOT 0.3 09/17/2014 1423   BILITOT 0.60 08/13/2014 0852   BILITOT 0.37 03/12/2014 0820     Impression and Plan: [name] is 70 year old gentleman with metastatic prostate cancer. He he is here today with clo dizziness with n/v.  He was seen by his PCP and give Antivert which he is taking as needed.  We will go ahead and get a head CT today to evaluate.  We will keep is already schedule follow-up with Dr. Marin Olp in October. I think he plans to change the appointment time for that day while he is here.  He knows to call with any questions or concerns. We can certainly see him sooner if need be.    Eliezer Bottom, NP 9/20/201611:01 AM

## 2014-10-06 ENCOUNTER — Telehealth: Payer: Self-pay | Admitting: *Deleted

## 2014-10-06 LAB — PSA: PSA: <0.01 ng/mL (ref <=4.00)

## 2014-10-06 NOTE — Telephone Encounter (Addendum)
Patient aware of results  ----- Message from [name], MD sent at 10/06/2014  6:22 AM EDT ----- Call - PSA is not detectable!!!  pete

## 2014-10-28 ENCOUNTER — Other Ambulatory Visit: Payer: Self-pay | Admitting: *Deleted

## 2014-10-28 NOTE — Progress Notes (Signed)
Erroneous encounter

## 2014-10-29 ENCOUNTER — Encounter: Payer: Self-pay | Admitting: Hematology & Oncology

## 2014-10-29 ENCOUNTER — Ambulatory Visit (HOSPITAL_BASED_OUTPATIENT_CLINIC_OR_DEPARTMENT_OTHER): Payer: Medicare Other | Admitting: Hematology & Oncology

## 2014-10-29 ENCOUNTER — Ambulatory Visit: Payer: Medicare Other | Admitting: Hematology & Oncology

## 2014-10-29 ENCOUNTER — Other Ambulatory Visit (HOSPITAL_BASED_OUTPATIENT_CLINIC_OR_DEPARTMENT_OTHER): Payer: Medicare Other

## 2014-10-29 ENCOUNTER — Other Ambulatory Visit: Payer: Medicare Other

## 2014-10-29 VITALS — BP 126/70 | HR 70 | Temp 97.6°F | Resp 16 | Ht 69.0 in | Wt 157.0 lb

## 2014-10-29 DIAGNOSIS — E1165 Type 2 diabetes mellitus with hyperglycemia: Secondary | ICD-10-CM

## 2014-10-29 DIAGNOSIS — E119 Type 2 diabetes mellitus without complications: Secondary | ICD-10-CM | POA: Diagnosis not present

## 2014-10-29 DIAGNOSIS — C61 Malignant neoplasm of prostate: Secondary | ICD-10-CM | POA: Diagnosis not present

## 2014-10-29 LAB — COMPREHENSIVE METABOLIC PANEL (CC13)
ALT: 9 U/L (ref 0–55)
AST: 14 U/L (ref 5–34)
Albumin: 3.8 g/dL (ref 3.5–5.0)
Alkaline Phosphatase: 70 U/L (ref 40–150)
Anion Gap: 8 mEq/L (ref 3–11)
BUN: 14.4 mg/dL (ref 7.0–26.0)
CO2: 26 mEq/L (ref 22–29)
Calcium: 9.5 mg/dL (ref 8.4–10.4)
Chloride: 107 mEq/L (ref 98–109)
Creatinine: 1.2 mg/dL (ref 0.7–1.3)
EGFR: 60 mL/min/{1.73_m2} — ABNORMAL LOW (ref >90)
Glucose: 94 mg/dl (ref 70–140)
Potassium: 4.2 mEq/L (ref 3.5–5.1)
Sodium: 142 mEq/L (ref 136–145)
Total Bilirubin: 0.52 mg/dL (ref 0.20–1.20)
Total Protein: 6.8 g/dL (ref 6.4–8.3)

## 2014-10-29 LAB — CBC WITH DIFFERENTIAL (CANCER CENTER ONLY)
BASO#: 0 10*3/uL (ref 0.0–0.2)
BASO%: 0.5 % (ref 0.0–2.0)
EOS%: 4 % (ref 0.0–7.0)
Eosinophils Absolute: 0.2 10*3/uL (ref 0.0–0.5)
HCT: 39 % (ref 38.7–49.9)
HGB: 12.7 g/dL — ABNORMAL LOW (ref 13.0–17.1)
LYMPH#: 1.4 10*3/uL (ref 0.9–3.3)
LYMPH%: 25 % (ref 14.0–48.0)
MCH: 31.5 pg (ref 28.0–33.4)
MCHC: 32.6 g/dL (ref 32.0–35.9)
MCV: 97 fL (ref 82–98)
MONO#: 0.5 10*3/uL (ref 0.1–0.9)
MONO%: 9.5 % (ref 0.0–13.0)
NEUT#: 3.3 10*3/uL (ref 1.5–6.5)
NEUT%: 61 % (ref 40.0–80.0)
Platelets: 235 10*3/uL (ref 145–400)
RBC: 4.03 10*6/uL — ABNORMAL LOW (ref 4.20–5.70)
RDW: 12.7 % (ref 11.1–15.7)
WBC: 5.5 10*3/uL (ref 4.0–10.0)

## 2014-10-29 LAB — LIPID PANEL
Cholesterol: 219 mg/dL — ABNORMAL HIGH (ref 125–200)
HDL: 51 mg/dL (ref >=40)
LDL Cholesterol: 144 mg/dL — ABNORMAL HIGH (ref <130)
Total CHOL/HDL Ratio: 4.3 Ratio (ref <=5.0)
Triglycerides: 120 mg/dL (ref <150)
VLDL: 24 mg/dL (ref <30)

## 2014-10-29 LAB — HEMOGLOBIN A1C
Hgb A1c MFr Bld: 5.8 % — ABNORMAL HIGH (ref <5.7)
Mean Plasma Glucose: 120 mg/dL — ABNORMAL HIGH (ref <117)

## 2014-10-29 NOTE — Progress Notes (Signed)
Hematology and Oncology Follow Up Visit  [name] [id number] [birth year] 69 y.o. 10/29/2014   Principle Diagnosis:   Metastatic castrate resistant prostate cancer  Current Therapy:    Zytiga 1000 mg by mouth daily  Xgeva 120 mg subcutaneous Q3 months - due in  December  Lupron 22 mg IM every 3 months - due  In December     Interim History:  Mr.  [name] is back for followup. He is doing quite well. He and his wife had their 30s when anniversary. They really had a great time.  His PSA has been remarkably low. We'll recheck this in September, it was less than 0.01.  His blood sugars have been on the high side. We will check his hemoglobin A1c.  He did have a CT of the brain recently. He was having some dizziness. CT of the brain shows some small vessel disease. However, there is no evidence of infarct or bleed. There is no metastasis.  He is still working.  He's had no cough. He's had no nausea or vomiting. Has been a change in bowel or bladder habits.   His performance status is ECOG 1.   Medications:  Current outpatient prescriptions:  .  aspirin 81 MG tablet, Take 81 mg by mouth daily., Disp: , Rfl:  .  Calcium Carbonate-Vitamin D (CALCIUM + D PO), Take 500 mg by mouth 2 (two) times daily., Disp: , Rfl:  .  gabapentin (NEURONTIN) 100 MG capsule, TAKE 1 CAPSULE BY MOUTH EVERY 8 HOURS (Patient taking differently: TAKE 1 CAPSULE BY MOUTH EVERY 12 HOURS), Disp: 90 capsule, Rfl: 6 .  glimepiride (AMARYL) 1 MG tablet, TAKE TWO TABLETS BY MOUTH DAILY WITH BREAKFAST, Disp: 60 tablet, Rfl: 4 .  isosorbide mononitrate (IMDUR) 30 MG 24 hr tablet, Take 30 mg by mouth daily., Disp: , Rfl:  .  meclizine (ANTIVERT) 25 MG tablet, , Disp: , Rfl:  .  metFORMIN (GLUCOPHAGE XR) 500 MG 24 hr tablet, Take 2 tablets (1,000 mg total) by mouth daily with breakfast. (Patient taking differently: Take 1,000 mg by mouth daily with breakfast. ), Disp: 60 tablet, Rfl: 4 .  Multiple Minerals-Vitamins  (CALCIUM & VIT D3 BONE HEALTH PO), Take by mouth every morning. , Disp: , Rfl:  .  OVER THE COUNTER MEDICATION, every morning. Juice Plus, Disp: , Rfl:  .  polyethylene glycol powder (MIRALAX) powder, Take 17 g by mouth daily., Disp: , Rfl:  .  predniSONE (DELTASONE) 5 MG tablet, TAKE 1 TABLET BY MOUTH ONCE DAILY, Disp: 60 tablet, Rfl: 0 .  solifenacin (VESICARE) 5 MG tablet, HOW TAKES WEEKLY ONLY, Disp: , Rfl:  .  traZODone (DESYREL) 50 MG tablet, bedtime as needed, Disp: , Rfl:  .  ZETIA 10 MG tablet, 10 mg daily., Disp: , Rfl:  .  ZYTIGA 250 MG tablet, TAKE 4 TABLETS BY MOUTH DAILY. TAKE ON AN EMPTY STOMACH 1 HOUR BEFORE OR 2 HOURS AFTER A MEAL, Disp: 120 tablet, Rfl: 0  Allergies:  Allergies  Allergen Reactions  . Codeine Nausea And Vomiting  . Hydrocodone Nausea And Vomiting    Past Medical History, Surgical history, Social history, and Family History were reviewed and updated.  Review of Systems: As above  Physical Exam:  height is '5\' 9"'$  (1.753 m) and weight is 157 lb (71.215 kg). His oral temperature is 97.6 F (36.4 C). His blood pressure is 126/70 and his pulse is 70. His respiration is 16.   Well-developed and well-nourished white male. Head  and neck exam shows no ocular or oral lesions. The are no palpable cervical or supraclavicular lymph nodes. Lungs are clear with no rales, wheezes or rhonchi. Cardiac exam regular rate and rhythm with no murmurs rubs or bruits. Abdomen is soft. He has good bowel sounds. There is no fluid wave. There is no palpable liver or spleen tip. Back exam shows no tenderness over the spine ribs or hips. I cannot elicit any tenderness over the sacrum bilaterally. He has good range of motion of his pelvis. Extremities shows no clubbing, cyanosis or edema. Skin exam shows areas of urticaria on the left forearm.. There is also an area as solitary in the upper chest wall in the upper sternal region. These are nontender. There are erythematous with a central  area of vesicle. No erythema to the skin is noted. Neurological exam is nonfocal.   Lab Results  Component Value Date   WBC 5.5 10/29/2014   HGB 12.7* 10/29/2014   HCT 39.0 10/29/2014   MCV 97 10/29/2014   PLT 235 10/29/2014     Chemistry      Component Value Date/Time   NA 141 10/05/2014 1008   NA 141 09/17/2014 1423   NA 141 08/13/2014 0852   K 3.8 10/05/2014 1008   K 4.5 09/17/2014 1423   K 3.6 08/13/2014 0852   CL 102 09/17/2014 1423   CL 106 08/13/2014 0852   CO2 29 10/05/2014 1008   CO2 30 09/17/2014 1423   CO2 28 08/13/2014 0852   BUN 15.3 10/05/2014 1008   BUN 15 09/17/2014 1423   BUN 19 08/13/2014 0852   CREATININE 1.2 10/05/2014 1008   CREATININE 1.05 09/17/2014 1423   CREATININE 1.2 08/13/2014 0852      Component Value Date/Time   CALCIUM 9.9 10/05/2014 1008   CALCIUM 9.8 09/17/2014 1423   CALCIUM 9.3 08/13/2014 0852   ALKPHOS 59 10/05/2014 1008   ALKPHOS 54 09/17/2014 1423   ALKPHOS 71 08/13/2014 0852   AST 13 10/05/2014 1008   AST 14 09/17/2014 1423   AST 20 08/13/2014 0852   ALT 10 10/05/2014 1008   ALT 12 09/17/2014 1423   ALT 12 08/13/2014 0852   BILITOT 0.50 10/05/2014 1008   BILITOT 0.3 09/17/2014 1423   BILITOT 0.60 08/13/2014 0852         Impression and Plan: Mr. [name] is 69 year old gentleman with metastatic prostate cancer. He completed his radiation therapy. This was back in mid April.   I am just amaze that his PSA has dropped so much. We will see what it is today.  He will get his Delton See and Lupron in December.  I think that because he's done so well, we probably can get him back in December.    [name], MD 10/14/20168:36 AM

## 2014-10-30 LAB — PSA: PSA: <0.01 ng/mL (ref <=4.00)

## 2014-11-01 ENCOUNTER — Encounter: Payer: Self-pay | Admitting: *Deleted

## 2014-11-08 ENCOUNTER — Other Ambulatory Visit: Payer: Self-pay | Admitting: Emergency Medicine

## 2014-11-08 DIAGNOSIS — R739 Hyperglycemia, unspecified: Secondary | ICD-10-CM

## 2014-11-08 MED ORDER — GABAPENTIN 100 MG PO CAPS: ORAL_CAPSULE | ORAL | Status: DC

## 2014-11-08 MED ORDER — METFORMIN HCL ER 500 MG PO TB24: 1000.0000 mg | ORAL_TABLET | Freq: Every day | ORAL | Status: DC

## 2014-12-07 ENCOUNTER — Other Ambulatory Visit: Payer: Self-pay | Admitting: Hematology & Oncology

## 2014-12-17 ENCOUNTER — Other Ambulatory Visit: Payer: Self-pay | Admitting: Hematology & Oncology

## 2014-12-24 ENCOUNTER — Other Ambulatory Visit (HOSPITAL_BASED_OUTPATIENT_CLINIC_OR_DEPARTMENT_OTHER): Payer: Medicare Other

## 2014-12-24 ENCOUNTER — Other Ambulatory Visit: Payer: Medicare Other

## 2014-12-24 ENCOUNTER — Encounter: Payer: Self-pay | Admitting: Hematology & Oncology

## 2014-12-24 ENCOUNTER — Ambulatory Visit (HOSPITAL_BASED_OUTPATIENT_CLINIC_OR_DEPARTMENT_OTHER): Payer: Medicare Other

## 2014-12-24 ENCOUNTER — Ambulatory Visit (HOSPITAL_BASED_OUTPATIENT_CLINIC_OR_DEPARTMENT_OTHER): Payer: Medicare Other | Admitting: Hematology & Oncology

## 2014-12-24 ENCOUNTER — Ambulatory Visit: Payer: Medicare Other

## 2014-12-24 ENCOUNTER — Ambulatory Visit: Payer: Medicare Other | Admitting: Hematology & Oncology

## 2014-12-24 VITALS — BP 127/74 | HR 78 | Temp 97.4°F | Resp 18 | Ht 69.0 in | Wt 160.0 lb

## 2014-12-24 DIAGNOSIS — C61 Malignant neoplasm of prostate: Secondary | ICD-10-CM

## 2014-12-24 DIAGNOSIS — E1165 Type 2 diabetes mellitus with hyperglycemia: Secondary | ICD-10-CM | POA: Diagnosis not present

## 2014-12-24 DIAGNOSIS — E119 Type 2 diabetes mellitus without complications: Secondary | ICD-10-CM | POA: Diagnosis not present

## 2014-12-24 DIAGNOSIS — Z5111 Encounter for antineoplastic chemotherapy: Secondary | ICD-10-CM

## 2014-12-24 DIAGNOSIS — C7951 Secondary malignant neoplasm of bone: Secondary | ICD-10-CM

## 2014-12-24 DIAGNOSIS — R7309 Other abnormal glucose: Secondary | ICD-10-CM | POA: Diagnosis not present

## 2014-12-24 LAB — CBC WITH DIFFERENTIAL (CANCER CENTER ONLY)
BASO#: 0 10*3/uL (ref 0.0–0.2)
BASO%: 0.7 % (ref 0.0–2.0)
EOS%: 3.7 % (ref 0.0–7.0)
Eosinophils Absolute: 0.2 10*3/uL (ref 0.0–0.5)
HCT: 37.6 % — ABNORMAL LOW (ref 38.7–49.9)
HGB: 12.2 g/dL — ABNORMAL LOW (ref 13.0–17.1)
LYMPH#: 1.3 10*3/uL (ref 0.9–3.3)
LYMPH%: 23.9 % (ref 14.0–48.0)
MCH: 31.5 pg (ref 28.0–33.4)
MCHC: 32.4 g/dL (ref 32.0–35.9)
MCV: 97 fL (ref 82–98)
MONO#: 0.5 10*3/uL (ref 0.1–0.9)
MONO%: 8.3 % (ref 0.0–13.0)
NEUT#: 3.4 10*3/uL (ref 1.5–6.5)
NEUT%: 63.4 % (ref 40.0–80.0)
Platelets: 205 10*3/uL (ref 145–400)
RBC: 3.87 10*6/uL — ABNORMAL LOW (ref 4.20–5.70)
RDW: 12.8 % (ref 11.1–15.7)
WBC: 5.4 10*3/uL (ref 4.0–10.0)

## 2014-12-24 LAB — COMPREHENSIVE METABOLIC PANEL
ALT: 9 U/L (ref 0–55)
AST: 15 U/L (ref 5–34)
Albumin: 3.7 g/dL (ref 3.5–5.0)
Alkaline Phosphatase: 66 U/L (ref 40–150)
Anion Gap: 12 mEq/L — ABNORMAL HIGH (ref 3–11)
BUN: 18.8 mg/dL (ref 7.0–26.0)
CO2: 26 mEq/L (ref 22–29)
Calcium: 9.9 mg/dL (ref 8.4–10.4)
Chloride: 103 mEq/L (ref 98–109)
Creatinine: 1.3 mg/dL (ref 0.7–1.3)
EGFR: 57 mL/min/{1.73_m2} — ABNORMAL LOW (ref >90)
Glucose: 125 mg/dl (ref 70–140)
Potassium: 3.9 mEq/L (ref 3.5–5.1)
Sodium: 141 mEq/L (ref 136–145)
Total Bilirubin: 0.54 mg/dL (ref 0.20–1.20)
Total Protein: 6.7 g/dL (ref 6.4–8.3)

## 2014-12-24 MED ORDER — DENOSUMAB 120 MG/1.7ML ~~LOC~~ SOLN
120.0000 mg | Freq: Once | SUBCUTANEOUS | Status: AC
  Administered 2014-12-24: 120 mg via SUBCUTANEOUS
  Filled 2014-12-24: qty 1.7

## 2014-12-24 MED ORDER — LEUPROLIDE ACETATE (3 MONTH) 22.5 MG IM KIT
22.5000 mg | PACK | Freq: Once | INTRAMUSCULAR | Status: AC
  Administered 2014-12-24: 22.5 mg via INTRAMUSCULAR
  Filled 2014-12-24: qty 22.5

## 2014-12-24 NOTE — Progress Notes (Signed)
Hematology and Oncology Follow Up Visit  [name] [id number] [birth year] 69 y.o. 12/24/2014   Principle Diagnosis:   Metastatic castrate resistant prostate cancer  Current Therapy:    Zytiga 1000 mg by mouth daily  Xgeva 120 mg subcutaneous Q3 months - due in  March  Lupron 22 mg IM every 3 months - due in March     Interim History:  [name] is back for followup. He is doing quite well. He is still working. He is trying to cut back a little bit on work.  He has had no problems with pain. His lower back occasionally is stiff.  His wife has noted that he has had some memory issues. 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 This is a "necessary evil" for prostate cancer treatment. I told them that if this becomes more of an issue, that his family physician should be able to help with this.  His blood sugars have been doing okay. He's had some cholesterol issues but is on medicine for this.  His appetite has been good. He is watching what he eats during the holidays. He has had no nausea or vomiting. There has been no change in bowel or bladder habits. He's had no dysuria or hematuria.  His performance status is ECOG 1.   Medications:  Current outpatient prescriptions:  .  aspirin 81 MG tablet, Take 81 mg by mouth daily., Disp: , Rfl:  .  Calcium Carbonate-Vitamin D (CALCIUM + D PO), Take 500 mg by mouth 2 (two) times daily., Disp: , Rfl:  .  gabapentin (NEURONTIN) 100 MG capsule, TAKE 1 CAPSULE BY MOUTH EVERY 8 HOURS, Disp: 90 capsule, Rfl: 6 .  glimepiride (AMARYL) 1 MG tablet, TAKE 2 TABLETS BY MOUTH DAILY WITH BREAKFAST, Disp: 60 tablet, Rfl: 4 .  isosorbide mononitrate (IMDUR) 30 MG 24 hr tablet, Take 30 mg by mouth daily., Disp: , Rfl:  .  meclizine (ANTIVERT) 25 MG tablet, , Disp: , Rfl:  .  metFORMIN (GLUCOPHAGE XR) 500 MG 24 hr tablet, Take 2 tablets (1,000 mg total) by mouth daily with breakfast., Disp: 60 tablet, Rfl: 4 .  Multiple  Minerals-Vitamins (CALCIUM & VIT D3 BONE HEALTH PO), Take by mouth every morning. , Disp: , Rfl:  .  OVER THE COUNTER MEDICATION, every morning. Juice Plus, Disp: , Rfl:  .  polyethylene glycol powder (MIRALAX) powder, Take 17 g by mouth daily., Disp: , Rfl:  .  predniSONE (DELTASONE) 5 MG tablet, TAKE 1 TABLET BY MOUTH ONCE DAILY, Disp: 60 tablet, Rfl: 0 .  solifenacin (VESICARE) 5 MG tablet, HOW TAKES WEEKLY ONLY, Disp: , Rfl:  .  traZODone (DESYREL) 50 MG tablet, bedtime as needed, Disp: , Rfl:  .  VESICARE 5 MG tablet, TAKE 1 TABLET BY MOUTH DAILY., Disp: 30 tablet, Rfl: 4 .  ZETIA 10 MG tablet, 10 mg daily., Disp: , Rfl:  .  ZYTIGA 250 MG tablet, TAKE 4 TABLETS BY MOUTH DAILY. TAKE ON AN EMPTY STOMACH 1 HOUR BEFORE OR 2 HOURS AFTER A MEAL, Disp: 120 tablet, Rfl: 3 No current facility-administered medications for this visit.  Facility-Administered Medications Ordered in Other Visits:  .  denosumab (XGEVA) injection 120 mg, 120 mg, Subcutaneous, Once, [name], MD .  leuprolide (LUPRON) injection 22.5 mg, 22.5 mg, Intramuscular, Once, [name], MD  Allergies:  Allergies  Allergen Reactions  . Codeine Nausea And Vomiting  . Hydrocodone Nausea And Vomiting    Past  Medical History, Surgical history, Social history, and Family History were reviewed and updated.  Review of Systems: As above  Physical Exam:  height is '5\' 9"'$  (1.753 m) and weight is 160 lb (72.576 kg). His oral temperature is 97.4 F (36.3 C). His blood pressure is 127/74 and his pulse is 78. His respiration is 18.   Well-developed and well-nourished white male. Head and neck exam shows no ocular or oral lesions. The are no palpable cervical or supraclavicular lymph nodes. Lungs are clear with no rales, wheezes or rhonchi. Cardiac exam regular rate and rhythm with no murmurs rubs or bruits. Abdomen is soft. He has good bowel sounds. There is no fluid wave. There is no palpable liver or spleen tip. Back exam  shows no tenderness over the spine ribs or hips. I cannot elicit any tenderness over the sacrum bilaterally. He has good range of motion of his pelvis. Extremities shows no clubbing, cyanosis or edema. Skin exam shows areas of urticaria on the left forearm.. There is also an area as solitary in the upper chest wall in the upper sternal region. These are nontender. There are erythematous with a central area of vesicle. No erythema to the skin is noted. Neurological exam is nonfocal.   Lab Results  Component Value Date   WBC 5.4 12/24/2014   HGB 12.2* 12/24/2014   HCT 37.6* 12/24/2014   MCV 97 12/24/2014   PLT 205 12/24/2014     Chemistry      Component Value Date/Time   NA 142 10/29/2014 0753   NA 141 09/17/2014 1423   NA 141 08/13/2014 0852   K 4.2 10/29/2014 0753   K 4.5 09/17/2014 1423   K 3.6 08/13/2014 0852   CL 102 09/17/2014 1423   CL 106 08/13/2014 0852   CO2 26 10/29/2014 0753   CO2 30 09/17/2014 1423   CO2 28 08/13/2014 0852   BUN 14.4 10/29/2014 0753   BUN 15 09/17/2014 1423   BUN 19 08/13/2014 0852   CREATININE 1.2 10/29/2014 0753   CREATININE 1.05 09/17/2014 1423   CREATININE 1.2 08/13/2014 0852      Component Value Date/Time   CALCIUM 9.5 10/29/2014 0753   CALCIUM 9.8 09/17/2014 1423   CALCIUM 9.3 08/13/2014 0852   ALKPHOS 70 10/29/2014 0753   ALKPHOS 54 09/17/2014 1423   ALKPHOS 71 08/13/2014 0852   AST 14 10/29/2014 0753   AST 14 09/17/2014 1423   AST 20 08/13/2014 0852   ALT 9 10/29/2014 0753   ALT 12 09/17/2014 1423   ALT 12 08/13/2014 0852   BILITOT 0.52 10/29/2014 0753   BILITOT 0.3 09/17/2014 1423   BILITOT 0.60 08/13/2014 0852         Impression and Plan: [name] is 69 year old gentleman with metastatic prostate cancer. He completed his radiation therapy. This was back in mid April.   I am just amazed that his PSA has dropped so much. We will see what it is today.  He will get his Delton See and Lupron today.  I think that because he's  done so well, we probably can get him back in 6 weeks    [name], MD 12/9/20168:54 AM

## 2014-12-24 NOTE — Patient Instructions (Signed)
Leuprolide injection What is this medicine? LEUPROLIDE (loo PROE lide) is a man-made hormone. It is used to treat the symptoms of prostate cancer. This medicine may also be used to treat children with early onset of puberty. It may be used for other hormonal conditions. This medicine may be used for other purposes; ask your health care provider or pharmacist if you have questions. What should I tell my health care provider before I take this medicine? They need to know if you have any of these conditions: -diabetes -heart disease or previous heart attack -high blood pressure -high cholesterol -pain or difficulty passing urine -spinal cord metastasis -stroke -tobacco smoker -an unusual or allergic reaction to leuprolide, benzyl alcohol, other medicines, foods, dyes, or preservatives -pregnant or trying to get pregnant -breast-feeding How should I use this medicine? This medicine is for injection under the skin or into a muscle. You will be taught how to prepare and give this medicine. Use exactly as directed. Take your medicine at regular intervals. Do not take your medicine more often than directed. It is important that you put your used needles and syringes in a special sharps container. Do not put them in a trash can. If you do not have a sharps container, call your pharmacist or healthcare provider to get one. Talk to your pediatrician regarding the use of this medicine in children. While this medicine may be prescribed for children as young as 8 years for selected conditions, precautions do apply. Overdosage: If you think you have taken too much of this medicine contact a poison control center or emergency room at once. NOTE: This medicine is only for you. Do not share this medicine with others. What if I miss a dose? If you miss a dose, take it as soon as you can. If it is almost time for your next dose, take only that dose. Do not take double or extra doses. What may interact with this  medicine? Do not take this medicine with any of the following medications: -chasteberry This medicine may also interact with the following medications: -herbal or dietary supplements, like black cohosh or DHEA -male hormones, like estrogens or progestins and birth control pills, patches, rings, or injections -male hormones, like testosterone This list may not describe all possible interactions. Give your health care provider a list of all the medicines, herbs, non-prescription drugs, or dietary supplements you use. Also tell them if you smoke, drink alcohol, or use illegal drugs. Some items may interact with your medicine. What should I watch for while using this medicine? Visit your doctor or health care professional for regular checks on your progress. During the first week, your symptoms may get worse, but then will improve as you continue your treatment. You may get hot flashes, increased bone pain, increased difficulty passing urine, or an aggravation of nerve symptoms. Discuss these effects with your doctor or health care professional, some of them may improve with continued use of this medicine. Male patients may experience a menstrual cycle or spotting during the first 2 months of therapy with this medicine. If this continues, contact your doctor or health care professional. What side effects may I notice from receiving this medicine? Side effects that you should report to your doctor or health care professional as soon as possible: -allergic reactions like skin rash, itching or hives, swelling of the face, lips, or tongue -breathing problems -chest pain -depression or memory disorders -pain in your legs or groin -pain at site where injected -severe headache -  swelling of the feet and legs -visual changes -vomiting Side effects that usually do not require medical attention (report to your doctor or health care professional if they continue or are bothersome): -breast swelling or  tenderness -decrease in sex drive or performance -diarrhea -hot flashes -loss of appetite -muscle, joint, or bone pains -nausea -redness or irritation at site where injected -skin problems or acne This list may not describe all possible side effects. Call your doctor for medical advice about side effects. You may report side effects to FDA at 1-800-FDA-1088. Where should I keep my medicine? Keep out of the reach of children. Store below 25 degrees C (77 degrees F). Do not freeze. Protect from light. Do not use if it is not clear or if there are particles present. Throw away any unused medicine after the expiration date. NOTE: This sheet is a summary. It may not cover all possible information. If you have questions about this medicine, talk to your doctor, pharmacist, or health care provider.    2016, Elsevier/Gold Standard. (2008-05-18 13:26:20) Denosumab injection What is this medicine? DENOSUMAB (den oh sue mab) slows bone breakdown. Prolia is used to treat osteoporosis in women after menopause and in men. Delton See is used to prevent bone fractures and other bone problems caused by cancer bone metastases. Delton See is also used to treat giant cell tumor of the bone. This medicine may be used for other purposes; ask your health care provider or pharmacist if you have questions. What should I tell my health care provider before I take this medicine? They need to know if you have any of these conditions: -dental disease -eczema -infection or history of infections -kidney disease or on dialysis -low blood calcium or vitamin D -malabsorption syndrome -scheduled to have surgery or tooth extraction -taking medicine that contains denosumab -thyroid or parathyroid disease -an unusual reaction to denosumab, other medicines, foods, dyes, or preservatives -pregnant or trying to get pregnant -breast-feeding How should I use this medicine? This medicine is for injection under the skin. It is given  by a health care professional in a hospital or clinic setting. If you are getting Prolia, a special MedGuide will be given to you by the pharmacist with each prescription and refill. Be sure to read this information carefully each time. For Prolia, talk to your pediatrician regarding the use of this medicine in children. Special care may be needed. For Delton See, talk to your pediatrician regarding the use of this medicine in children. While this drug may be prescribed for children as young as 13 years for selected conditions, precautions do apply. Overdosage: If you think you have taken too much of this medicine contact a poison control center or emergency room at once. NOTE: This medicine is only for you. Do not share this medicine with others. What if I miss a dose? It is important not to miss your dose. Call your doctor or health care professional if you are unable to keep an appointment. What may interact with this medicine? Do not take this medicine with any of the following medications: -other medicines containing denosumab This medicine may also interact with the following medications: -medicines that suppress the immune system -medicines that treat cancer -steroid medicines like prednisone or cortisone This list may not describe all possible interactions. Give your health care provider a list of all the medicines, herbs, non-prescription drugs, or dietary supplements you use. Also tell them if you smoke, drink alcohol, or use illegal drugs. Some items may interact with your  medicine. What should I watch for while using this medicine? Visit your doctor or health care professional for regular checks on your progress. Your doctor or health care professional may order blood tests and other tests to see how you are doing. Call your doctor or health care professional if you get a cold or other infection while receiving this medicine. Do not treat yourself. This medicine may decrease your body's  ability to fight infection. You should make sure you get enough calcium and vitamin D while you are taking this medicine, unless your doctor tells you not to. Discuss the foods you eat and the vitamins you take with your health care professional. See your dentist regularly. Brush and floss your teeth as directed. Before you have any dental work done, tell your dentist you are receiving this medicine. Do not become pregnant while taking this medicine or for 5 months after stopping it. Women should inform their doctor if they wish to become pregnant or think they might be pregnant. There is a potential for serious side effects to an unborn child. Talk to your health care professional or pharmacist for more information. What side effects may I notice from receiving this medicine? Side effects that you should report to your doctor or health care professional as soon as possible: -allergic reactions like skin rash, itching or hives, swelling of the face, lips, or tongue -breathing problems -chest pain -fast, irregular heartbeat -feeling faint or lightheaded, falls -fever, chills, or any other sign of infection -muscle spasms, tightening, or twitches -numbness or tingling -skin blisters or bumps, or is dry, peels, or red -slow healing or unexplained pain in the mouth or jaw -unusual bleeding or bruising Side effects that usually do not require medical attention (Report these to your doctor or health care professional if they continue or are bothersome.): -muscle pain -stomach upset, gas This list may not describe all possible side effects. Call your doctor for medical advice about side effects. You may report side effects to FDA at 1-800-FDA-1088. Where should I keep my medicine? This medicine is only given in a clinic, doctor's office, or other health care setting and will not be stored at home. NOTE: This sheet is a summary. It may not cover all possible information. If you have questions about  this medicine, talk to your doctor, pharmacist, or health care provider.    2016, Elsevier/Gold Standard. (2011-07-02 12:37:47)

## 2014-12-25 LAB — PSA: PSA: <0.01 ng/mL (ref <=4.00)

## 2014-12-27 ENCOUNTER — Telehealth: Payer: Self-pay | Admitting: *Deleted

## 2014-12-27 NOTE — Telephone Encounter (Addendum)
Patient aware of results  ---- Message from [name], MD sent at 12/25/2014  8:14 AM EST ----- Call - PSA is still < 0.01!!!  Merry Christmas!!  pete

## 2015-01-12 ENCOUNTER — Encounter: Payer: Self-pay | Admitting: *Deleted

## 2015-01-13 ENCOUNTER — Telehealth: Payer: Self-pay

## 2015-01-13 ENCOUNTER — Encounter: Payer: Self-pay | Admitting: Hematology & Oncology

## 2015-01-13 NOTE — Telephone Encounter (Signed)
Received call from pt stating he was evaluated by dentist today and is being referred to an endodontist at 2p today. Pt states "they think I might need to re-do a root canal." Pt wishes to confirm that Dr Marin Olp would be ok with that procedure.   Per Dr Marin Olp, ok to proceed with root canal if needed. Pt notified and verbalizes understanding. dph

## 2015-01-18 DIAGNOSIS — L570 Actinic keratosis: Secondary | ICD-10-CM | POA: Diagnosis not present

## 2015-01-18 DIAGNOSIS — C44319 Basal cell carcinoma of skin of other parts of face: Secondary | ICD-10-CM | POA: Diagnosis not present

## 2015-01-18 DIAGNOSIS — M629 Disorder of muscle, unspecified: Secondary | ICD-10-CM | POA: Diagnosis not present

## 2015-01-18 DIAGNOSIS — S86311A Strain of muscle(s) and tendon(s) of peroneal muscle group at lower leg level, right leg, initial encounter: Secondary | ICD-10-CM | POA: Diagnosis not present

## 2015-01-18 DIAGNOSIS — M9903 Segmental and somatic dysfunction of lumbar region: Secondary | ICD-10-CM | POA: Diagnosis not present

## 2015-01-18 DIAGNOSIS — S336XXA Sprain of sacroiliac joint, initial encounter: Secondary | ICD-10-CM | POA: Diagnosis not present

## 2015-01-19 DIAGNOSIS — M9903 Segmental and somatic dysfunction of lumbar region: Secondary | ICD-10-CM | POA: Diagnosis not present

## 2015-01-19 DIAGNOSIS — S86311A Strain of muscle(s) and tendon(s) of peroneal muscle group at lower leg level, right leg, initial encounter: Secondary | ICD-10-CM | POA: Diagnosis not present

## 2015-01-19 DIAGNOSIS — S336XXA Sprain of sacroiliac joint, initial encounter: Secondary | ICD-10-CM | POA: Diagnosis not present

## 2015-01-19 DIAGNOSIS — M629 Disorder of muscle, unspecified: Secondary | ICD-10-CM | POA: Diagnosis not present

## 2015-01-20 DIAGNOSIS — S86311A Strain of muscle(s) and tendon(s) of peroneal muscle group at lower leg level, right leg, initial encounter: Secondary | ICD-10-CM | POA: Diagnosis not present

## 2015-01-20 DIAGNOSIS — M629 Disorder of muscle, unspecified: Secondary | ICD-10-CM | POA: Diagnosis not present

## 2015-01-20 DIAGNOSIS — M9903 Segmental and somatic dysfunction of lumbar region: Secondary | ICD-10-CM | POA: Diagnosis not present

## 2015-01-20 DIAGNOSIS — S336XXA Sprain of sacroiliac joint, initial encounter: Secondary | ICD-10-CM | POA: Diagnosis not present

## 2015-01-21 ENCOUNTER — Other Ambulatory Visit: Payer: Self-pay | Admitting: Hematology & Oncology

## 2015-01-25 DIAGNOSIS — E785 Hyperlipidemia, unspecified: Secondary | ICD-10-CM | POA: Diagnosis not present

## 2015-01-25 DIAGNOSIS — M629 Disorder of muscle, unspecified: Secondary | ICD-10-CM | POA: Diagnosis not present

## 2015-01-25 DIAGNOSIS — S86311A Strain of muscle(s) and tendon(s) of peroneal muscle group at lower leg level, right leg, initial encounter: Secondary | ICD-10-CM | POA: Diagnosis not present

## 2015-01-25 DIAGNOSIS — I10 Essential (primary) hypertension: Secondary | ICD-10-CM | POA: Diagnosis not present

## 2015-01-25 DIAGNOSIS — Z6824 Body mass index (BMI) 24.0-24.9, adult: Secondary | ICD-10-CM | POA: Diagnosis not present

## 2015-01-25 DIAGNOSIS — E119 Type 2 diabetes mellitus without complications: Secondary | ICD-10-CM | POA: Diagnosis not present

## 2015-01-25 DIAGNOSIS — M9903 Segmental and somatic dysfunction of lumbar region: Secondary | ICD-10-CM | POA: Diagnosis not present

## 2015-01-25 DIAGNOSIS — S336XXA Sprain of sacroiliac joint, initial encounter: Secondary | ICD-10-CM | POA: Diagnosis not present

## 2015-01-25 DIAGNOSIS — I209 Angina pectoris, unspecified: Secondary | ICD-10-CM | POA: Diagnosis not present

## 2015-01-26 DIAGNOSIS — M9903 Segmental and somatic dysfunction of lumbar region: Secondary | ICD-10-CM | POA: Diagnosis not present

## 2015-01-26 DIAGNOSIS — M629 Disorder of muscle, unspecified: Secondary | ICD-10-CM | POA: Diagnosis not present

## 2015-01-26 DIAGNOSIS — S86311A Strain of muscle(s) and tendon(s) of peroneal muscle group at lower leg level, right leg, initial encounter: Secondary | ICD-10-CM | POA: Diagnosis not present

## 2015-01-26 DIAGNOSIS — S336XXA Sprain of sacroiliac joint, initial encounter: Secondary | ICD-10-CM | POA: Diagnosis not present

## 2015-01-27 DIAGNOSIS — S336XXA Sprain of sacroiliac joint, initial encounter: Secondary | ICD-10-CM | POA: Diagnosis not present

## 2015-01-27 DIAGNOSIS — S86311A Strain of muscle(s) and tendon(s) of peroneal muscle group at lower leg level, right leg, initial encounter: Secondary | ICD-10-CM | POA: Diagnosis not present

## 2015-01-27 DIAGNOSIS — M9903 Segmental and somatic dysfunction of lumbar region: Secondary | ICD-10-CM | POA: Diagnosis not present

## 2015-01-27 DIAGNOSIS — M629 Disorder of muscle, unspecified: Secondary | ICD-10-CM | POA: Diagnosis not present

## 2015-01-28 DIAGNOSIS — E785 Hyperlipidemia, unspecified: Secondary | ICD-10-CM | POA: Diagnosis not present

## 2015-01-28 DIAGNOSIS — R799 Abnormal finding of blood chemistry, unspecified: Secondary | ICD-10-CM | POA: Diagnosis not present

## 2015-01-28 DIAGNOSIS — R7303 Prediabetes: Secondary | ICD-10-CM | POA: Diagnosis not present

## 2015-01-28 MED FILL — ZETIA 10 MG TABLET: 10 | 30 days supply | Qty: 30 | Fill #3

## 2015-02-01 DIAGNOSIS — M9903 Segmental and somatic dysfunction of lumbar region: Secondary | ICD-10-CM | POA: Diagnosis not present

## 2015-02-01 DIAGNOSIS — M62830 Muscle spasm of back: Secondary | ICD-10-CM | POA: Diagnosis not present

## 2015-02-01 DIAGNOSIS — S335XXA Sprain of ligaments of lumbar spine, initial encounter: Secondary | ICD-10-CM | POA: Diagnosis not present

## 2015-02-03 DIAGNOSIS — M62830 Muscle spasm of back: Secondary | ICD-10-CM | POA: Diagnosis not present

## 2015-02-03 DIAGNOSIS — M9903 Segmental and somatic dysfunction of lumbar region: Secondary | ICD-10-CM | POA: Diagnosis not present

## 2015-02-03 DIAGNOSIS — S335XXA Sprain of ligaments of lumbar spine, initial encounter: Secondary | ICD-10-CM | POA: Diagnosis not present

## 2015-02-03 MED FILL — GLIMEPIRIDE 1 MG TABLET: 1 | 30 days supply | Qty: 60 | Fill #2

## 2015-02-03 MED FILL — predniSONE 5 MG TABS: 5 | 60 days supply | Qty: 60 | Fill #3

## 2015-02-03 MED FILL — ISOSORBIDE MN ER 30 MG TAB: 30 | 30 days supply | Qty: 30 | Fill #2

## 2015-02-03 MED FILL — METFORMIN HCL ER 500 MG TAB: 500 | 30 days supply | Qty: 60 | Fill #2

## 2015-02-04 ENCOUNTER — Encounter: Payer: Self-pay | Admitting: Hematology & Oncology

## 2015-02-04 ENCOUNTER — Other Ambulatory Visit (HOSPITAL_BASED_OUTPATIENT_CLINIC_OR_DEPARTMENT_OTHER): Payer: Medicare Other

## 2015-02-04 ENCOUNTER — Ambulatory Visit (HOSPITAL_BASED_OUTPATIENT_CLINIC_OR_DEPARTMENT_OTHER): Payer: Medicare Other | Admitting: Hematology & Oncology

## 2015-02-04 VITALS — BP 115/74 | HR 82 | Temp 97.8°F | Resp 16 | Ht 69.0 in | Wt 159.0 lb

## 2015-02-04 DIAGNOSIS — C61 Malignant neoplasm of prostate: Secondary | ICD-10-CM

## 2015-02-04 LAB — CMP (CANCER CENTER ONLY)
ALT(SGPT): 16 U/L (ref 10–47)
AST: 21 U/L (ref 11–38)
Albumin: 3.5 g/dL (ref 3.3–5.5)
Alkaline Phosphatase: 54 U/L (ref 26–84)
BUN, Bld: 12 mg/dL (ref 7–22)
CO2: 30 mEq/L (ref 18–33)
Calcium: 9.2 mg/dL (ref 8.0–10.3)
Chloride: 102 mEq/L (ref 98–108)
Creat: 0.9 mg/dl (ref 0.6–1.2)
Glucose, Bld: 122 mg/dL — ABNORMAL HIGH (ref 73–118)
Potassium: 4.1 mEq/L (ref 3.3–4.7)
Sodium: 140 mEq/L (ref 128–145)
Total Bilirubin: 0.6 mg/dl (ref 0.20–1.60)
Total Protein: 6.7 g/dL (ref 6.4–8.1)

## 2015-02-04 LAB — CBC WITH DIFFERENTIAL (CANCER CENTER ONLY)
BASO#: 0 10*3/uL (ref 0.0–0.2)
BASO%: 0.4 % (ref 0.0–2.0)
EOS%: 3 % (ref 0.0–7.0)
Eosinophils Absolute: 0.2 10*3/uL (ref 0.0–0.5)
HCT: 37 % — ABNORMAL LOW (ref 38.7–49.9)
HGB: 12 g/dL — ABNORMAL LOW (ref 13.0–17.1)
LYMPH#: 1.3 10*3/uL (ref 0.9–3.3)
LYMPH%: 17 % (ref 14.0–48.0)
MCH: 31.2 pg (ref 28.0–33.4)
MCHC: 32.4 g/dL (ref 32.0–35.9)
MCV: 96 fL (ref 82–98)
MONO#: 0.7 10*3/uL (ref 0.1–0.9)
MONO%: 8.2 % (ref 0.0–13.0)
NEUT#: 5.6 10*3/uL (ref 1.5–6.5)
NEUT%: 71.4 % (ref 40.0–80.0)
Platelets: 261 10*3/uL (ref 145–400)
RBC: 3.85 10*6/uL — ABNORMAL LOW (ref 4.20–5.70)
RDW: 12.7 % (ref 11.1–15.7)
WBC: 7.9 10*3/uL (ref 4.0–10.0)

## 2015-02-04 MED FILL — ZYTIGA 250 MG TABLET: 250 | 30 days supply | Qty: 120 | Fill #2

## 2015-02-04 MED FILL — GABAPENTIN 100 MG CAPSULE: 100 | 30 days supply | Qty: 90 | Fill #3

## 2015-02-04 NOTE — Progress Notes (Signed)
Hematology and Oncology Follow Up Visit  Whit Bruni [id number] [birth year] 70 y.o. 02/04/2015   Principle Diagnosis:   Metastatic castrate resistant prostate cancer  Current Therapy:    Zytiga 1000 mg by mouth daily  Xgeva 120 mg subcutaneous Q3 months - due in  March  Lupron 22 mg IM every 3 months - due in March     Interim History:  Mr.  [name] is back for followup. He is doing quite well. He is still working. He is trying to cut back a little bit on work.  His back and right leg issues are better. It sounds like he had a pinched nerve. He was seen a Restaurant manager, fast food. The chiropractor seemed to help him out quite a bit.  His PSA has been non-detectable. Back in December, his PSA was less than 0.01. Para vias testosterone deficient.  He's had no problems with bowels or bladder. His try to watch what he eats and try to manage his blood sugars. He is on glimepiride which is helping control his blood sugars pretty well. His low-dose prednisone that he takes with his eye take it probably is the main culprit for his hyperglycemia.  His performance status is ECOG 1.   Medications:  Current outpatient prescriptions:  .  aspirin 81 MG tablet, Take 81 mg by mouth daily., Disp: , Rfl:  .  Calcium Carbonate-Vitamin D (CALCIUM + D PO), Take 500 mg by mouth 2 (two) times daily., Disp: , Rfl:  .  gabapentin (NEURONTIN) 100 MG capsule, TAKE 1 CAPSULE BY MOUTH EVERY 8 HOURS, Disp: 90 capsule, Rfl: 6 .  glimepiride (AMARYL) 1 MG tablet, TAKE 2 TABLETS BY MOUTH DAILY WITH BREAKFAST, Disp: 60 tablet, Rfl: 4 .  isosorbide mononitrate (IMDUR) 30 MG 24 hr tablet, Take 30 mg by mouth daily., Disp: , Rfl:  .  meclizine (ANTIVERT) 25 MG tablet, , Disp: , Rfl:  .  metFORMIN (GLUCOPHAGE XR) 500 MG 24 hr tablet, Take 2 tablets (1,000 mg total) by mouth daily with breakfast., Disp: 60 tablet, Rfl: 4 .  Multiple Minerals-Vitamins (CALCIUM & VIT D3 BONE HEALTH PO), Take by mouth every morning. , Disp: , Rfl:    .  OVER THE COUNTER MEDICATION, every morning. Juice Plus, Disp: , Rfl:  .  polyethylene glycol powder (MIRALAX) powder, Take 17 g by mouth daily., Disp: , Rfl:  .  predniSONE (DELTASONE) 5 MG tablet, TAKE 1 TABLET BY MOUTH ONCE DAILY, Disp: 60 tablet, Rfl: 0 .  solifenacin (VESICARE) 5 MG tablet, HOW TAKES WEEKLY ONLY, Disp: , Rfl:  .  traZODone (DESYREL) 50 MG tablet, bedtime as needed, Disp: , Rfl:  .  ZETIA 10 MG tablet, 10 mg daily., Disp: , Rfl:  .  ZYTIGA 250 MG tablet, TAKE 4 TABLETS BY MOUTH DAILY. TAKE ON AN EMPTY STOMACH 1 HOUR BEFORE OR 2 HOURS AFTER A MEAL, Disp: 120 tablet, Rfl: 3  Allergies:  Allergies  Allergen Reactions  . Codeine Nausea And Vomiting  . Hydrocodone Nausea And Vomiting    Past Medical History, Surgical history, Social history, and Family History were reviewed and updated.  Review of Systems: As above  Physical Exam:  height is '5\' 9"'$  (1.753 m) and weight is 159 lb (72.122 kg). His oral temperature is 97.8 F (36.6 C). His blood pressure is 115/74 and his pulse is 82. His respiration is 16.   Well-developed and well-nourished white male. Head and neck exam shows no ocular or oral lesions. The are no palpable  cervical or supraclavicular lymph nodes. Lungs are clear with no rales, wheezes or rhonchi. Cardiac exam regular rate and rhythm with no murmurs rubs or bruits. Abdomen is soft. He has good bowel sounds. There is no fluid wave. There is no palpable liver or spleen tip. Back exam shows no tenderness over the spine ribs or hips. I cannot elicit any tenderness over the sacrum bilaterally. He has good range of motion of his pelvis. Extremities shows no clubbing, cyanosis or edema. Skin exam shows areas of urticaria on the left forearm.. There is also an area as solitary in the upper chest wall in the upper sternal region. These are nontender. There are erythematous with a central area of vesicle. No erythema to the skin is noted. Neurological exam is  nonfocal.   Lab Results  Component Value Date   WBC 7.9 02/04/2015   HGB 12.0* 02/04/2015   HCT 37.0* 02/04/2015   MCV 96 02/04/2015   PLT 261 02/04/2015     Chemistry      Component Value Date/Time   NA 140 02/04/2015 1209   NA 141 12/24/2014 0801   NA 141 09/17/2014 1423   K 4.1 02/04/2015 1209   K 3.9 12/24/2014 0801   K 4.5 09/17/2014 1423   CL 102 02/04/2015 1209   CL 102 09/17/2014 1423   CO2 30 02/04/2015 1209   CO2 26 12/24/2014 0801   CO2 30 09/17/2014 1423   BUN 12 02/04/2015 1209   BUN 18.8 12/24/2014 0801   BUN 15 09/17/2014 1423   CREATININE 0.9 02/04/2015 1209   CREATININE 1.3 12/24/2014 0801   CREATININE 1.05 09/17/2014 1423      Component Value Date/Time   CALCIUM 9.2 02/04/2015 1209   CALCIUM 9.9 12/24/2014 0801   CALCIUM 9.8 09/17/2014 1423   ALKPHOS 54 02/04/2015 1209   ALKPHOS 66 12/24/2014 0801   ALKPHOS 54 09/17/2014 1423   AST 21 02/04/2015 1209   AST 15 12/24/2014 0801   AST 14 09/17/2014 1423   ALT 16 02/04/2015 1209   ALT 9 12/24/2014 0801   ALT 12 09/17/2014 1423   BILITOT 0.60 02/04/2015 1209   BILITOT 0.54 12/24/2014 0801   BILITOT 0.3 09/17/2014 1423         Impression and Plan: Mr. [name] is 70 year old gentleman with metastatic prostate cancer. He completed his radiation therapy. This was back in mid April.   I'm glad has back is doing better. Thankfully, this is not from prostate cancer.  We will plan to get him back in 6 weeks. At that point time, we will go ahead with his Xgeva and Lupron.  He will continue on the Zytiga.    [name], MD 1/20/20171:38 PM

## 2015-02-05 LAB — PSA: Prostate Specific Ag, Serum: <0.1 ng/mL (ref 0.0–4.0)

## 2015-02-05 LAB — PSA (PARALLEL TESTING): PSA: <0.01 ng/mL (ref <=4.00)

## 2015-02-07 ENCOUNTER — Telehealth: Payer: Self-pay | Admitting: *Deleted

## 2015-02-07 NOTE — Telephone Encounter (Addendum)
Patient aware of results   ----- Message from [name], MD sent at 02/07/2015  9:09 AM EST ----- Call - PSA is still not detectable!!!  [name]

## 2015-02-09 DIAGNOSIS — M62830 Muscle spasm of back: Secondary | ICD-10-CM | POA: Diagnosis not present

## 2015-02-09 DIAGNOSIS — S335XXA Sprain of ligaments of lumbar spine, initial encounter: Secondary | ICD-10-CM | POA: Diagnosis not present

## 2015-02-09 DIAGNOSIS — M9903 Segmental and somatic dysfunction of lumbar region: Secondary | ICD-10-CM | POA: Diagnosis not present

## 2015-02-10 DIAGNOSIS — S335XXA Sprain of ligaments of lumbar spine, initial encounter: Secondary | ICD-10-CM | POA: Diagnosis not present

## 2015-02-10 DIAGNOSIS — M62830 Muscle spasm of back: Secondary | ICD-10-CM | POA: Diagnosis not present

## 2015-02-10 DIAGNOSIS — M9903 Segmental and somatic dysfunction of lumbar region: Secondary | ICD-10-CM | POA: Diagnosis not present

## 2015-02-16 DIAGNOSIS — M62830 Muscle spasm of back: Secondary | ICD-10-CM | POA: Diagnosis not present

## 2015-02-16 DIAGNOSIS — S335XXA Sprain of ligaments of lumbar spine, initial encounter: Secondary | ICD-10-CM | POA: Diagnosis not present

## 2015-02-16 DIAGNOSIS — M9903 Segmental and somatic dysfunction of lumbar region: Secondary | ICD-10-CM | POA: Diagnosis not present

## 2015-03-01 MED FILL — ZETIA 10 MG TABLET: 10 | 30 days supply | Qty: 30 | Fill #0

## 2015-03-01 MED FILL — METFORMIN HCL ER 500 MG TAB: 500 | 30 days supply | Qty: 60 | Fill #3

## 2015-03-01 MED FILL — ISOSORBIDE MN ER 30 MG TAB: 30 | 30 days supply | Qty: 30 | Fill #3

## 2015-03-01 MED FILL — ZYTIGA 250 MG TABLET: 250 | 30 days supply | Qty: 120 | Fill #3

## 2015-03-01 MED FILL — GABAPENTIN 100 MG CAPSULE: 100 | 30 days supply | Qty: 90 | Fill #4

## 2015-03-01 MED FILL — GLIMEPIRIDE 1 MG TABLET: 1 | 30 days supply | Qty: 60 | Fill #3

## 2015-03-02 DIAGNOSIS — S335XXA Sprain of ligaments of lumbar spine, initial encounter: Secondary | ICD-10-CM | POA: Diagnosis not present

## 2015-03-02 DIAGNOSIS — M9903 Segmental and somatic dysfunction of lumbar region: Secondary | ICD-10-CM | POA: Diagnosis not present

## 2015-03-02 DIAGNOSIS — M62830 Muscle spasm of back: Secondary | ICD-10-CM | POA: Diagnosis not present

## 2015-03-18 ENCOUNTER — Other Ambulatory Visit: Payer: Self-pay | Admitting: Hematology & Oncology

## 2015-03-18 ENCOUNTER — Telehealth: Payer: Self-pay | Admitting: Hematology & Oncology

## 2015-03-18 ENCOUNTER — Other Ambulatory Visit (HOSPITAL_BASED_OUTPATIENT_CLINIC_OR_DEPARTMENT_OTHER): Payer: Medicare Other

## 2015-03-18 ENCOUNTER — Encounter: Payer: Self-pay | Admitting: Hematology & Oncology

## 2015-03-18 ENCOUNTER — Ambulatory Visit (HOSPITAL_BASED_OUTPATIENT_CLINIC_OR_DEPARTMENT_OTHER): Payer: Medicare Other

## 2015-03-18 ENCOUNTER — Ambulatory Visit (HOSPITAL_BASED_OUTPATIENT_CLINIC_OR_DEPARTMENT_OTHER): Payer: Medicare Other | Admitting: Hematology & Oncology

## 2015-03-18 VITALS — BP 125/72 | HR 82 | Temp 98.2°F | Resp 16 | Ht 69.0 in | Wt 158.0 lb

## 2015-03-18 DIAGNOSIS — C61 Malignant neoplasm of prostate: Secondary | ICD-10-CM | POA: Diagnosis not present

## 2015-03-18 DIAGNOSIS — Z5111 Encounter for antineoplastic chemotherapy: Secondary | ICD-10-CM | POA: Diagnosis present

## 2015-03-18 DIAGNOSIS — C7951 Secondary malignant neoplasm of bone: Secondary | ICD-10-CM | POA: Diagnosis not present

## 2015-03-18 LAB — CBC WITH DIFFERENTIAL (CANCER CENTER ONLY)
BASO#: 0 10*3/uL (ref 0.0–0.2)
BASO%: 0.6 % (ref 0.0–2.0)
EOS%: 4.5 % (ref 0.0–7.0)
Eosinophils Absolute: 0.3 10*3/uL (ref 0.0–0.5)
HCT: 35.4 % — ABNORMAL LOW (ref 38.7–49.9)
HGB: 11.7 g/dL — ABNORMAL LOW (ref 13.0–17.1)
LYMPH#: 1 10*3/uL (ref 0.9–3.3)
LYMPH%: 15.6 % (ref 14.0–48.0)
MCH: 31.5 pg (ref 28.0–33.4)
MCHC: 33.1 g/dL (ref 32.0–35.9)
MCV: 95 fL (ref 82–98)
MONO#: 0.5 10*3/uL (ref 0.1–0.9)
MONO%: 8.1 % (ref 0.0–13.0)
NEUT#: 4.5 10*3/uL (ref 1.5–6.5)
NEUT%: 71.2 % (ref 40.0–80.0)
Platelets: 262 10*3/uL (ref 145–400)
RBC: 3.71 10*6/uL — ABNORMAL LOW (ref 4.20–5.70)
RDW: 12.9 % (ref 11.1–15.7)
WBC: 6.3 10*3/uL (ref 4.0–10.0)

## 2015-03-18 LAB — CMP (CANCER CENTER ONLY)
ALT(SGPT): 19 U/L (ref 10–47)
AST: 23 U/L (ref 11–38)
Albumin: 3.4 g/dL (ref 3.3–5.5)
Alkaline Phosphatase: 71 U/L (ref 26–84)
BUN, Bld: 18 mg/dL (ref 7–22)
CO2: 31 mEq/L (ref 18–33)
Calcium: 10 mg/dL (ref 8.0–10.3)
Chloride: 101 mEq/L (ref 98–108)
Creat: 1.4 mg/dl — ABNORMAL HIGH (ref 0.6–1.2)
Glucose, Bld: 85 mg/dL (ref 73–118)
Potassium: 3.5 mEq/L (ref 3.3–4.7)
Sodium: 144 mEq/L (ref 128–145)
Total Bilirubin: 0.7 mg/dl (ref 0.20–1.60)
Total Protein: 7.1 g/dL (ref 6.4–8.1)

## 2015-03-18 LAB — CHCC SATELLITE - SMEAR

## 2015-03-18 MED ORDER — LEUPROLIDE ACETATE (3 MONTH) 22.5 MG IM KIT
22.5000 mg | PACK | Freq: Once | INTRAMUSCULAR | Status: AC
  Administered 2015-03-18: 22.5 mg via INTRAMUSCULAR
  Filled 2015-03-18: qty 22.5

## 2015-03-18 MED ORDER — DENOSUMAB 120 MG/1.7ML ~~LOC~~ SOLN
120.0000 mg | Freq: Once | SUBCUTANEOUS | Status: AC
  Administered 2015-03-18: 120 mg via SUBCUTANEOUS
  Filled 2015-03-18: qty 1.7

## 2015-03-18 NOTE — Progress Notes (Signed)
Hematology and Oncology Follow Up Visit  [name] [id number] 04/01/1945 70 y.o. 03/18/2015   Principle Diagnosis:   Metastatic castrate resistant prostate cancer  Current Therapy:    Zytiga 1000 mg by mouth daily  Xgeva 120 mg subcutaneous Q3 months - due in  June  Lupron 22 mg IM every 3 months - due in June     Interim History:  [name] is back for followup. He is doing quite well. He is still working. He is trying to cut back a little bit on work.  His back and right leg issues are better. It sounds like he had a pinched nerve. He was seen a Restaurant manager, fast food. The chiropractor seemed to help him out quite a bit.  His PSA has been non-detectable. Back in January, his PSA was less than 0.01. His testosterone was deficient.  He's had no problems with bowels or bladder. His try to watch what he eats and try to manage his blood sugars. He is on glimepiride which is helping control his blood sugars pretty well. His low-dose prednisone that he takes with his Zytiga probably is the main culprit for his hyperglycemia.  His wife has her about the ill effects of metformin. I have not heard about this yet.  His performance status is ECOG 1.   Medications:  Current outpatient prescriptions:  .  aspirin 81 MG tablet, Take 81 mg by mouth daily., Disp: , Rfl:  .  Calcium Carbonate-Vitamin D (CALCIUM + D PO), Take 500 mg by mouth 2 (two) times daily., Disp: , Rfl:  .  gabapentin (NEURONTIN) 100 MG capsule, TAKE 1 CAPSULE BY MOUTH EVERY 8 HOURS, Disp: 90 capsule, Rfl: 6 .  glimepiride (AMARYL) 1 MG tablet, TAKE 2 TABLETS BY MOUTH DAILY WITH BREAKFAST, Disp: 60 tablet, Rfl: 4 .  isosorbide mononitrate (IMDUR) 30 MG 24 hr tablet, Take 30 mg by mouth daily., Disp: , Rfl:  .  meclizine (ANTIVERT) 25 MG tablet, , Disp: , Rfl:  .  metFORMIN (GLUCOPHAGE XR) 500 MG 24 hr tablet, Take 2 tablets (1,000 mg total) by mouth daily with breakfast., Disp: 60 tablet, Rfl: 4 .  Multiple Minerals-Vitamins  (CALCIUM & VIT D3 BONE HEALTH PO), Take by mouth every morning. , Disp: , Rfl:  .  OVER THE COUNTER MEDICATION, every morning. Juice Plus, Disp: , Rfl:  .  polyethylene glycol powder (MIRALAX) powder, Take 17 g by mouth daily., Disp: , Rfl:  .  predniSONE (DELTASONE) 5 MG tablet, TAKE 1 TABLET BY MOUTH ONCE DAILY, Disp: 60 tablet, Rfl: 0 .  traZODone (DESYREL) 50 MG tablet, bedtime as needed, Disp: , Rfl:  .  ZETIA 10 MG tablet, 10 mg daily., Disp: , Rfl:  .  ZYTIGA 250 MG tablet, TAKE 4 TABLETS BY MOUTH DAILY. TAKE ON AN EMPTY STOMACH 1 HOUR BEFORE OR 2 HOURS AFTER A MEAL, Disp: 120 tablet, Rfl: 3 No current facility-administered medications for this visit.  Facility-Administered Medications Ordered in Other Visits:  .  denosumab (XGEVA) injection 120 mg, 120 mg, Subcutaneous, Once, [name], MD .  leuprolide (LUPRON) injection 22.5 mg, 22.5 mg, Intramuscular, Once, [name], MD  Allergies:  Allergies  Allergen Reactions  . Codeine Nausea And Vomiting  . Hydrocodone Nausea And Vomiting    Past Medical History, Surgical history, Social history, and Family History were reviewed and updated.  Review of Systems: As above  Physical Exam:  height is '5\' 9"'$  (1.753 m) and weight is 158 lb (  71.668 kg). His oral temperature is 98.2 F (36.8 C). His blood pressure is 125/72 and his pulse is 82. His respiration is 16.   Well-developed and well-nourished white male. Head and neck exam shows no ocular or oral lesions. The are no palpable cervical or supraclavicular lymph nodes. Lungs are clear with no rales, wheezes or rhonchi. Cardiac exam regular rate and rhythm with no murmurs rubs or bruits. Abdomen is soft. He has good bowel sounds. There is no fluid wave. There is no palpable liver or spleen tip. Back exam shows no tenderness over the spine ribs or hips. I cannot elicit any tenderness over the sacrum bilaterally. He has good range of motion of his pelvis. Extremities shows no  clubbing, cyanosis or edema. Skin exam shows areas of urticaria on the left forearm.. There is also an area as solitary in the upper chest wall in the upper sternal region. These are nontender. There are erythematous with a central area of vesicle. No erythema to the skin is noted. Neurological exam is nonfocal.   Lab Results  Component Value Date   WBC 6.3 03/18/2015   HGB 11.7* 03/18/2015   HCT 35.4* 03/18/2015   MCV 95 03/18/2015   PLT 262 03/18/2015     Chemistry      Component Value Date/Time   NA 144 03/18/2015 0904   NA 141 12/24/2014 0801   NA 141 09/17/2014 1423   K 3.5 03/18/2015 0904   K 3.9 12/24/2014 0801   K 4.5 09/17/2014 1423   CL 101 03/18/2015 0904   CL 102 09/17/2014 1423   CO2 31 03/18/2015 0904   CO2 26 12/24/2014 0801   CO2 30 09/17/2014 1423   BUN 18 03/18/2015 0904   BUN 18.8 12/24/2014 0801   BUN 15 09/17/2014 1423   CREATININE 1.4* 03/18/2015 0904   CREATININE 1.3 12/24/2014 0801   CREATININE 1.05 09/17/2014 1423      Component Value Date/Time   CALCIUM 10.0 03/18/2015 0904   CALCIUM 9.9 12/24/2014 0801   CALCIUM 9.8 09/17/2014 1423   ALKPHOS 71 03/18/2015 0904   ALKPHOS 66 12/24/2014 0801   ALKPHOS 54 09/17/2014 1423   AST 23 03/18/2015 0904   AST 15 12/24/2014 0801   AST 14 09/17/2014 1423   ALT 19 03/18/2015 0904   ALT 9 12/24/2014 0801   ALT 12 09/17/2014 1423   BILITOT 0.70 03/18/2015 0904   BILITOT 0.54 12/24/2014 0801   BILITOT 0.3 09/17/2014 1423         Impression and Plan: [name] is 70 year old gentleman with metastatic prostate cancer. He completed his radiation therapy. This was back in mid April.   For now, everything looks fantastic.  We will go ahead and plan to get him back in 6 more weeks.  I spent about 25 minutes with he and his family. As always, they're very nice.  He will get all of his injections today.   [name], MD 3/3/201710:21 AM

## 2015-03-18 NOTE — Patient Instructions (Signed)
Denosumab injection What is this medicine? DENOSUMAB (den oh sue mab) slows bone breakdown. Prolia is used to treat osteoporosis in women after menopause and in men. Xgeva is used to prevent bone fractures and other bone problems caused by cancer bone metastases. Xgeva is also used to treat giant cell tumor of the bone. This medicine may be used for other purposes; ask your health care provider or pharmacist if you have questions. What should I tell my health care provider before I take this medicine? They need to know if you have any of these conditions: -dental disease -eczema -infection or history of infections -kidney disease or on dialysis -low blood calcium or vitamin D -malabsorption syndrome -scheduled to have surgery or tooth extraction -taking medicine that contains denosumab -thyroid or parathyroid disease -an unusual reaction to denosumab, other medicines, foods, dyes, or preservatives -pregnant or trying to get pregnant -breast-feeding How should I use this medicine? This medicine is for injection under the skin. It is given by a health care professional in a hospital or clinic setting. If you are getting Prolia, a special MedGuide will be given to you by the pharmacist with each prescription and refill. Be sure to read this information carefully each time. For Prolia, talk to your pediatrician regarding the use of this medicine in children. Special care may be needed. For Xgeva, talk to your pediatrician regarding the use of this medicine in children. While this drug may be prescribed for children as young as 13 years for selected conditions, precautions do apply. Overdosage: If you think you have taken too much of this medicine contact a poison control center or emergency room at once. NOTE: This medicine is only for you. Do not share this medicine with others. What if I miss a dose? It is important not to miss your dose. Call your doctor or health care professional if you are  unable to keep an appointment. What may interact with this medicine? Do not take this medicine with any of the following medications: -other medicines containing denosumab This medicine may also interact with the following medications: -medicines that suppress the immune system -medicines that treat cancer -steroid medicines like prednisone or cortisone This list may not describe all possible interactions. Give your health care provider a list of all the medicines, herbs, non-prescription drugs, or dietary supplements you use. Also tell them if you smoke, drink alcohol, or use illegal drugs. Some items may interact with your medicine. What should I watch for while using this medicine? Visit your doctor or health care professional for regular checks on your progress. Your doctor or health care professional may order blood tests and other tests to see how you are doing. Call your doctor or health care professional if you get a cold or other infection while receiving this medicine. Do not treat yourself. This medicine may decrease your body's ability to fight infection. You should make sure you get enough calcium and vitamin D while you are taking this medicine, unless your doctor tells you not to. Discuss the foods you eat and the vitamins you take with your health care professional. See your dentist regularly. Brush and floss your teeth as directed. Before you have any dental work done, tell your dentist you are receiving this medicine. Do not become pregnant while taking this medicine or for 5 months after stopping it. Women should inform their doctor if they wish to become pregnant or think they might be pregnant. There is a potential for serious side effects   to an unborn child. Talk to your health care professional or pharmacist for more information. What side effects may I notice from receiving this medicine? Side effects that you should report to your doctor or health care professional as soon as  possible: -allergic reactions like skin rash, itching or hives, swelling of the face, lips, or tongue -breathing problems -chest pain -fast, irregular heartbeat -feeling faint or lightheaded, falls -fever, chills, or any other sign of infection -muscle spasms, tightening, or twitches -numbness or tingling -skin blisters or bumps, or is dry, peels, or red -slow healing or unexplained pain in the mouth or jaw -unusual bleeding or bruising Side effects that usually do not require medical attention (Report these to your doctor or health care professional if they continue or are bothersome.): -muscle pain -stomach upset, gas This list may not describe all possible side effects. Call your doctor for medical advice about side effects. You may report side effects to FDA at 1-800-FDA-1088. Where should I keep my medicine? This medicine is only given in a clinic, doctor's office, or other health care setting and will not be stored at home. NOTE: This sheet is a summary. It may not cover all possible information. If you have questions about this medicine, talk to your doctor, pharmacist, or health care provider.    2016, Elsevier/Gold Standard. (2011-07-02 12:37:47) Leuprolide injection What is this medicine? LEUPROLIDE (loo PROE lide) is a man-made hormone. It is used to treat the symptoms of prostate cancer. This medicine may also be used to treat children with early onset of puberty. It may be used for other hormonal conditions. This medicine may be used for other purposes; ask your health care provider or pharmacist if you have questions. What should I tell my health care provider before I take this medicine? They need to know if you have any of these conditions: -diabetes -heart disease or previous heart attack -high blood pressure -high cholesterol -pain or difficulty passing urine -spinal cord metastasis -stroke -tobacco smoker -an unusual or allergic reaction to leuprolide, benzyl  alcohol, other medicines, foods, dyes, or preservatives -pregnant or trying to get pregnant -breast-feeding How should I use this medicine? This medicine is for injection under the skin or into a muscle. You will be taught how to prepare and give this medicine. Use exactly as directed. Take your medicine at regular intervals. Do not take your medicine more often than directed. It is important that you put your used needles and syringes in a special sharps container. Do not put them in a trash can. If you do not have a sharps container, call your pharmacist or healthcare provider to get one. Talk to your pediatrician regarding the use of this medicine in children. While this medicine may be prescribed for children as young as 8 years for selected conditions, precautions do apply. Overdosage: If you think you have taken too much of this medicine contact a poison control center or emergency room at once. NOTE: This medicine is only for you. Do not share this medicine with others. What if I miss a dose? If you miss a dose, take it as soon as you can. If it is almost time for your next dose, take only that dose. Do not take double or extra doses. What may interact with this medicine? Do not take this medicine with any of the following medications: -chasteberry This medicine may also interact with the following medications: -herbal or dietary supplements, like black cohosh or DHEA -male hormones, like estrogens  or progestins and birth control pills, patches, rings, or injections -male hormones, like testosterone This list may not describe all possible interactions. Give your health care provider a list of all the medicines, herbs, non-prescription drugs, or dietary supplements you use. Also tell them if you smoke, drink alcohol, or use illegal drugs. Some items may interact with your medicine. What should I watch for while using this medicine? Visit your doctor or health care professional for regular  checks on your progress. During the first week, your symptoms may get worse, but then will improve as you continue your treatment. You may get hot flashes, increased bone pain, increased difficulty passing urine, or an aggravation of nerve symptoms. Discuss these effects with your doctor or health care professional, some of them may improve with continued use of this medicine. Male patients may experience a menstrual cycle or spotting during the first 2 months of therapy with this medicine. If this continues, contact your doctor or health care professional. What side effects may I notice from receiving this medicine? Side effects that you should report to your doctor or health care professional as soon as possible: -allergic reactions like skin rash, itching or hives, swelling of the face, lips, or tongue -breathing problems -chest pain -depression or memory disorders -pain in your legs or groin -pain at site where injected -severe headache -swelling of the feet and legs -visual changes -vomiting Side effects that usually do not require medical attention (report to your doctor or health care professional if they continue or are bothersome): -breast swelling or tenderness -decrease in sex drive or performance -diarrhea -hot flashes -loss of appetite -muscle, joint, or bone pains -nausea -redness or irritation at site where injected -skin problems or acne This list may not describe all possible side effects. Call your doctor for medical advice about side effects. You may report side effects to FDA at 1-800-FDA-1088. Where should I keep my medicine? Keep out of the reach of children. Store below 25 degrees C (77 degrees F). Do not freeze. Protect from light. Do not use if it is not clear or if there are particles present. Throw away any unused medicine after the expiration date. NOTE: This sheet is a summary. It may not cover all possible information. If you have questions about this  medicine, talk to your doctor, pharmacist, or health care provider.    2016, Elsevier/Gold Standard. (2008-05-18 13:26:20)

## 2015-03-18 NOTE — Telephone Encounter (Signed)
[employer] assistance will exp on 03/26/2015. I called today and was told no more available funding at this time for Prostate Cancer.  However, pt can cb around 03/25/2015 to double check to see if any funding has been awarded with this program.  PAN ID: [id number]   Other programs that may be able to assit pt with his meds is below: (pt aware) [place]: [place]: [place]

## 2015-03-19 LAB — PSA: Prostate Specific Ag, Serum: <0.1 ng/mL (ref 0.0–4.0)

## 2015-03-19 LAB — PSA (PARALLEL TESTING): PSA: <0.01 ng/mL (ref <=4.00)

## 2015-03-21 ENCOUNTER — Telehealth: Payer: Self-pay | Admitting: *Deleted

## 2015-03-21 NOTE — Telephone Encounter (Addendum)
Patient aware of results  ----- Message from [name], MD sent at 03/21/2015  6:41 AM EST ----- Call - PSA is still not really detectable!!!  pete

## 2015-03-25 DIAGNOSIS — H5203 Hypermetropia, bilateral: Secondary | ICD-10-CM | POA: Diagnosis not present

## 2015-03-25 DIAGNOSIS — H25813 Combined forms of age-related cataract, bilateral: Secondary | ICD-10-CM | POA: Diagnosis not present

## 2015-03-25 DIAGNOSIS — H52223 Regular astigmatism, bilateral: Secondary | ICD-10-CM | POA: Diagnosis not present

## 2015-03-25 DIAGNOSIS — Z7984 Long term (current) use of oral hypoglycemic drugs: Secondary | ICD-10-CM | POA: Diagnosis not present

## 2015-03-25 DIAGNOSIS — H524 Presbyopia: Secondary | ICD-10-CM | POA: Diagnosis not present

## 2015-03-25 DIAGNOSIS — H43393 Other vitreous opacities, bilateral: Secondary | ICD-10-CM | POA: Diagnosis not present

## 2015-03-25 DIAGNOSIS — H43813 Vitreous degeneration, bilateral: Secondary | ICD-10-CM | POA: Diagnosis not present

## 2015-03-25 DIAGNOSIS — E119 Type 2 diabetes mellitus without complications: Secondary | ICD-10-CM | POA: Diagnosis not present

## 2015-03-25 MED FILL — ZYTIGA 250 MG TABLET: 250 | 30 days supply | Qty: 120 | Fill #0

## 2015-03-29 ENCOUNTER — Other Ambulatory Visit: Payer: Self-pay | Admitting: Hematology & Oncology

## 2015-03-29 MED FILL — GLIMEPIRIDE 1 MG TABLET: 1 | 30 days supply | Qty: 60 | Fill #4

## 2015-03-29 MED FILL — METFORMIN HCL ER 500 MG TAB: 500 | 30 days supply | Qty: 60 | Fill #4

## 2015-03-29 MED FILL — ZETIA 10 MG TABLET: 10 | 30 days supply | Qty: 30 | Fill #1

## 2015-03-29 MED FILL — ISOSORBIDE MN ER 30 MG TAB: 30 | 30 days supply | Qty: 30 | Fill #4

## 2015-03-29 MED FILL — predniSONE 5 MG TABS: 5 | 60 days supply | Qty: 60 | Fill #0

## 2015-03-29 MED FILL — GABAPENTIN 100 MG CAPSULE: 100 | 30 days supply | Qty: 90 | Fill #5

## 2015-04-21 ENCOUNTER — Other Ambulatory Visit: Payer: Self-pay | Admitting: Hematology & Oncology

## 2015-04-22 MED FILL — ZETIA 10 MG TABLET: 10 | 30 days supply | Qty: 30 | Fill #0

## 2015-04-22 MED FILL — GLIMEPIRIDE 1 MG TABLET: 1 | 30 days supply | Qty: 60 | Fill #0

## 2015-04-22 MED FILL — METFORMIN HCL ER 500 MG TAB: 500 | 30 days supply | Qty: 60 | Fill #0

## 2015-04-29 ENCOUNTER — Encounter: Payer: Self-pay | Admitting: Hematology & Oncology

## 2015-04-29 ENCOUNTER — Ambulatory Visit (HOSPITAL_BASED_OUTPATIENT_CLINIC_OR_DEPARTMENT_OTHER): Payer: Medicare Other | Admitting: Hematology & Oncology

## 2015-04-29 ENCOUNTER — Other Ambulatory Visit (HOSPITAL_BASED_OUTPATIENT_CLINIC_OR_DEPARTMENT_OTHER): Payer: Medicare Other

## 2015-04-29 VITALS — BP 133/67 | HR 87 | Temp 98.1°F | Resp 16 | Ht 69.0 in | Wt 160.0 lb

## 2015-04-29 DIAGNOSIS — C61 Malignant neoplasm of prostate: Secondary | ICD-10-CM

## 2015-04-29 LAB — CBC WITH DIFFERENTIAL (CANCER CENTER ONLY)
BASO#: 0 10*3/uL (ref 0.0–0.2)
BASO%: 0.6 % (ref 0.0–2.0)
EOS%: 4.8 % (ref 0.0–7.0)
Eosinophils Absolute: 0.3 10*3/uL (ref 0.0–0.5)
HCT: 38.2 % — ABNORMAL LOW (ref 38.7–49.9)
HGB: 12.7 g/dL — ABNORMAL LOW (ref 13.0–17.1)
LYMPH#: 1.1 10*3/uL (ref 0.9–3.3)
LYMPH%: 21 % (ref 14.0–48.0)
MCH: 32 pg (ref 28.0–33.4)
MCHC: 33.2 g/dL (ref 32.0–35.9)
MCV: 96 fL (ref 82–98)
MONO#: 0.4 10*3/uL (ref 0.1–0.9)
MONO%: 8 % (ref 0.0–13.0)
NEUT#: 3.5 10*3/uL (ref 1.5–6.5)
NEUT%: 65.6 % (ref 40.0–80.0)
Platelets: 257 10*3/uL (ref 145–400)
RBC: 3.97 10*6/uL — ABNORMAL LOW (ref 4.20–5.70)
RDW: 13.1 % (ref 11.1–15.7)
WBC: 5.4 10*3/uL (ref 4.0–10.0)

## 2015-04-29 LAB — COMPREHENSIVE METABOLIC PANEL
ALT: 11 U/L (ref 0–55)
AST: 16 U/L (ref 5–34)
Albumin: 3.8 g/dL (ref 3.5–5.0)
Alkaline Phosphatase: 63 U/L (ref 40–150)
Anion Gap: 11 mEq/L (ref 3–11)
BUN: 16.1 mg/dL (ref 7.0–26.0)
CO2: 28 mEq/L (ref 22–29)
Calcium: 10.2 mg/dL (ref 8.4–10.4)
Chloride: 102 mEq/L (ref 98–109)
Creatinine: 1.3 mg/dL (ref 0.7–1.3)
EGFR: 56 mL/min/{1.73_m2} — ABNORMAL LOW (ref >90)
Glucose: 162 mg/dl — ABNORMAL HIGH (ref 70–140)
Potassium: 4 mEq/L (ref 3.5–5.1)
Sodium: 141 mEq/L (ref 136–145)
Total Bilirubin: 0.46 mg/dL (ref 0.20–1.20)
Total Protein: 7.1 g/dL (ref 6.4–8.3)

## 2015-04-29 NOTE — Progress Notes (Signed)
Hematology and Oncology Follow Up Visit  [name] [id number] 07-31-1945 70 y.o. 04/29/2015   Principle Diagnosis:   Metastatic castrate resistant prostate cancer  Current Therapy:    Zytiga 1000 mg by mouth daily  Xgeva 120 mg subcutaneous Q3 months - due in  June  Lupron 22 mg IM every 3 months - due in June     Interim History:  [name] is back for followup. He is doing quite well. He is still working. He is trying to cut back a little bit on work.  His back and right leg issues are better. It sounds like he had a pinched nerve. He was seen a Restaurant manager, fast food. The chiropractor seemed to help him out quite a bit.  His PSA has been non-detectable. Back in March, his PSA was less than 0.1. His testosterone was deficient.  He's had no problems with bowels or bladder. His is trying to watch what he eats and trying to manage his blood sugars. He is on glimepiride which is helping control his blood sugars pretty well. His low-dose prednisone, that he takes with his Zytiga, probably is the main culprit for his hyperglycemia.  Sounds like there may be some issue with him getting the Zytiga in the future. So far, he's been able to afford it. Down the road, it might be a problem. I know that some studies are coming out that show that patient can take a quarter of the dose of Zytiga with food and still get the same effect as the full dose of side take, which is taken on an empty stomach.   His performance status is ECOG 1.   Medications:  Current outpatient prescriptions:  .  aspirin 81 MG tablet, Take 81 mg by mouth daily., Disp: , Rfl:  .  Calcium Carbonate-Vitamin D (CALCIUM + D PO), Take 500 mg by mouth 2 (two) times daily., Disp: , Rfl:  .  gabapentin (NEURONTIN) 100 MG capsule, TAKE 1 CAPSULE BY MOUTH EVERY 8 HOURS, Disp: 90 capsule, Rfl: 6 .  glimepiride (AMARYL) 1 MG tablet, TAKE 2 TABLETS BY MOUTH DAILY WITH BREAKFAST, Disp: 60 tablet, Rfl: 4 .  isosorbide mononitrate  (IMDUR) 30 MG 24 hr tablet, Take 30 mg by mouth daily., Disp: , Rfl:  .  meclizine (ANTIVERT) 25 MG tablet, , Disp: , Rfl:  .  metFORMIN (GLUCOPHAGE-XR) 500 MG 24 hr tablet, TAKE 2 TABLETS (1,000 MG TOTAL) BY MOUTH EVERY MORNING WITH BREAKFAST., Disp: 60 tablet, Rfl: 4 .  Multiple Minerals-Vitamins (CALCIUM & VIT D3 BONE HEALTH PO), Take by mouth every morning. , Disp: , Rfl:  .  OVER THE COUNTER MEDICATION, every morning. Juice Plus, Disp: , Rfl:  .  polyethylene glycol powder (MIRALAX) powder, Take 17 g by mouth daily., Disp: , Rfl:  .  predniSONE (DELTASONE) 5 MG tablet, TAKE 1 TABLET BY MOUTH DAILY, Disp: 60 tablet, Rfl: 3 .  traZODone (DESYREL) 50 MG tablet, bedtime as needed, Disp: , Rfl:  .  ZETIA 10 MG tablet, 10 mg daily., Disp: , Rfl:  .  ZYTIGA 250 MG tablet, TAKE 4 TABLETS BY MOUTH DAILY. TAKE ON AN EMPTY STOMACH 1 HOUR BEFORE OR 2 HOURS AFTER A MEAL, Disp: 120 tablet, Rfl: 3  Allergies:  Allergies  Allergen Reactions  . Codeine Nausea And Vomiting  . Hydrocodone Nausea And Vomiting    Past Medical History, Surgical history, Social history, and Family History were reviewed and updated.  Review of Systems: As above  Physical  Exam:  height is '5\' 9"'$  (1.753 m) and weight is 160 lb (72.576 kg). His oral temperature is 98.1 F (36.7 C). His blood pressure is 133/67 and his pulse is 87. His respiration is 16.   Well-developed and well-nourished white male. Head and neck exam shows no ocular or oral lesions. The are no palpable cervical or supraclavicular lymph nodes. Lungs are clear with no rales, wheezes or rhonchi. Cardiac exam regular rate and rhythm with no murmurs rubs or bruits. Abdomen is soft. He has good bowel sounds. There is no fluid wave. There is no palpable liver or spleen tip. Back exam shows no tenderness over the spine ribs or hips. I cannot elicit any tenderness over the sacrum bilaterally. He has good range of motion of his pelvis. Extremities shows no clubbing,  cyanosis or edema. Skin exam shows areas of urticaria on the left forearm.. There is also an area as solitary in the upper chest wall in the upper sternal region. These are nontender. There are erythematous with a central area of vesicle. No erythema to the skin is noted. Neurological exam is nonfocal.   Lab Results  Component Value Date   WBC 5.4 04/29/2015   HGB 12.7* 04/29/2015   HCT 38.2* 04/29/2015   MCV 96 04/29/2015   PLT 257 04/29/2015     Chemistry      Component Value Date/Time   NA 144 03/18/2015 0904   NA 141 12/24/2014 0801   NA 141 09/17/2014 1423   K 3.5 03/18/2015 0904   K 3.9 12/24/2014 0801   K 4.5 09/17/2014 1423   CL 101 03/18/2015 0904   CL 102 09/17/2014 1423   CO2 31 03/18/2015 0904   CO2 26 12/24/2014 0801   CO2 30 09/17/2014 1423   BUN 18 03/18/2015 0904   BUN 18.8 12/24/2014 0801   BUN 15 09/17/2014 1423   CREATININE 1.4* 03/18/2015 0904   CREATININE 1.3 12/24/2014 0801   CREATININE 1.05 09/17/2014 1423      Component Value Date/Time   CALCIUM 10.0 03/18/2015 0904   CALCIUM 9.9 12/24/2014 0801   CALCIUM 9.8 09/17/2014 1423   ALKPHOS 71 03/18/2015 0904   ALKPHOS 66 12/24/2014 0801   ALKPHOS 54 09/17/2014 1423   AST 23 03/18/2015 0904   AST 15 12/24/2014 0801   AST 14 09/17/2014 1423   ALT 19 03/18/2015 0904   ALT 9 12/24/2014 0801   ALT 12 09/17/2014 1423   BILITOT 0.70 03/18/2015 0904   BILITOT 0.54 12/24/2014 0801   BILITOT 0.3 09/17/2014 1423         Impression and Plan: [name] is 70 year old gentleman with metastatic prostate cancer.   He is doing quite well. I was and that his prostate cancer is still in remission.   We will see him back in about 6 weeks. We'll see him back, we will then plan for his injections.   [name], MD 4/14/201710:15 AM

## 2015-04-30 LAB — PSA: Prostate Specific Ag, Serum: <0.1 ng/mL (ref 0.0–4.0)

## 2015-05-02 ENCOUNTER — Telehealth: Payer: Self-pay | Admitting: *Deleted

## 2015-05-02 NOTE — Telephone Encounter (Addendum)
Patient aware of results.   ----- Message from [name], MD sent at 04/30/2015 10:55 AM EDT ----- Call - PSA is still not detectable!!  [name]

## 2015-05-19 MED FILL — METFORMIN HCL ER 500 MG TAB: 500 | 30 days supply | Qty: 60 | Fill #1

## 2015-05-19 MED FILL — ZYTIGA 250 MG TABLET: 250 | 30 days supply | Qty: 120 | Fill #1

## 2015-05-19 MED FILL — GLIMEPIRIDE 1 MG TABLET: 1 | 30 days supply | Qty: 60 | Fill #1

## 2015-05-19 MED FILL — ISOSORBIDE MN ER 30 MG TAB: 30 | 30 days supply | Qty: 30 | Fill #5

## 2015-05-19 MED FILL — ZETIA 10 MG TABLET: 10 | 30 days supply | Qty: 30 | Fill #1

## 2015-05-19 MED FILL — predniSONE 5 MG TABS: 5 | 60 days supply | Qty: 60 | Fill #1

## 2015-05-30 DIAGNOSIS — Z6824 Body mass index (BMI) 24.0-24.9, adult: Secondary | ICD-10-CM | POA: Diagnosis not present

## 2015-05-30 DIAGNOSIS — J309 Allergic rhinitis, unspecified: Secondary | ICD-10-CM | POA: Diagnosis not present

## 2015-06-02 MED FILL — GABAPENTIN 100 MG CAPSULE: 100 | 30 days supply | Qty: 90 | Fill #6

## 2015-06-10 ENCOUNTER — Other Ambulatory Visit (HOSPITAL_BASED_OUTPATIENT_CLINIC_OR_DEPARTMENT_OTHER): Payer: Medicare Other

## 2015-06-10 ENCOUNTER — Ambulatory Visit (HOSPITAL_BASED_OUTPATIENT_CLINIC_OR_DEPARTMENT_OTHER): Payer: Medicare Other

## 2015-06-10 ENCOUNTER — Ambulatory Visit (HOSPITAL_BASED_OUTPATIENT_CLINIC_OR_DEPARTMENT_OTHER): Payer: Medicare Other | Admitting: Hematology & Oncology

## 2015-06-10 ENCOUNTER — Ambulatory Visit (HOSPITAL_BASED_OUTPATIENT_CLINIC_OR_DEPARTMENT_OTHER)
Admission: RE | Admit: 2015-06-10 | Discharge: 2015-06-10 | Disposition: A | Discharge status: Home / Self Care | Payer: Medicare Other | Source: Ambulatory Visit | Attending: Hematology & Oncology | Admitting: Hematology & Oncology

## 2015-06-10 ENCOUNTER — Encounter: Payer: Self-pay | Admitting: Hematology & Oncology

## 2015-06-10 VITALS — BP 123/61 | HR 71 | Temp 97.4°F | Resp 16 | Ht 69.0 in | Wt 156.0 lb

## 2015-06-10 DIAGNOSIS — J209 Acute bronchitis, unspecified: Secondary | ICD-10-CM | POA: Diagnosis not present

## 2015-06-10 DIAGNOSIS — Z5111 Encounter for antineoplastic chemotherapy: Secondary | ICD-10-CM

## 2015-06-10 DIAGNOSIS — C7951 Secondary malignant neoplasm of bone: Secondary | ICD-10-CM | POA: Diagnosis not present

## 2015-06-10 DIAGNOSIS — R05 Cough: Secondary | ICD-10-CM | POA: Diagnosis not present

## 2015-06-10 DIAGNOSIS — C61 Malignant neoplasm of prostate: Secondary | ICD-10-CM

## 2015-06-10 LAB — CBC WITH DIFFERENTIAL (CANCER CENTER ONLY)
BASO#: 0 10*3/uL (ref 0.0–0.2)
BASO%: 0.7 % (ref 0.0–2.0)
EOS%: 2 % (ref 0.0–7.0)
Eosinophils Absolute: 0.1 10*3/uL (ref 0.0–0.5)
HCT: 38.4 % — ABNORMAL LOW (ref 38.7–49.9)
HGB: 12.7 g/dL — ABNORMAL LOW (ref 13.0–17.1)
LYMPH#: 1.4 10*3/uL (ref 0.9–3.3)
LYMPH%: 23.1 % (ref 14.0–48.0)
MCH: 31.8 pg (ref 28.0–33.4)
MCHC: 33.1 g/dL (ref 32.0–35.9)
MCV: 96 fL (ref 82–98)
MONO#: 0.4 10*3/uL (ref 0.1–0.9)
MONO%: 6.4 % (ref 0.0–13.0)
NEUT#: 4.1 10*3/uL (ref 1.5–6.5)
NEUT%: 67.8 % (ref 40.0–80.0)
Platelets: 261 10*3/uL (ref 145–400)
RBC: 4 10*6/uL — ABNORMAL LOW (ref 4.20–5.70)
RDW: 13 % (ref 11.1–15.7)
WBC: 6.1 10*3/uL (ref 4.0–10.0)

## 2015-06-10 LAB — CMP (CANCER CENTER ONLY)
ALT(SGPT): 23 U/L (ref 10–47)
AST: 20 U/L (ref 11–38)
Albumin: 3.5 g/dL (ref 3.3–5.5)
Alkaline Phosphatase: 72 U/L (ref 26–84)
BUN, Bld: 16 mg/dL (ref 7–22)
CO2: 30 mEq/L (ref 18–33)
Calcium: 10 mg/dL (ref 8.0–10.3)
Chloride: 98 mEq/L (ref 98–108)
Creat: 1.3 mg/dl — ABNORMAL HIGH (ref 0.6–1.2)
Glucose, Bld: 120 mg/dL — ABNORMAL HIGH (ref 73–118)
Potassium: 3.4 mEq/L (ref 3.3–4.7)
Sodium: 138 mEq/L (ref 128–145)
Total Bilirubin: 0.6 mg/dl (ref 0.20–1.60)
Total Protein: 6.8 g/dL (ref 6.4–8.1)

## 2015-06-10 MED ORDER — LEUPROLIDE ACETATE (3 MONTH) 22.5 MG IM KIT
22.5000 mg | PACK | Freq: Once | INTRAMUSCULAR | Status: AC
Start: 2015-06-10 — End: 2015-06-10
  Administered 2015-06-10: 22.5 mg via INTRAMUSCULAR
  Filled 2015-06-10: qty 22.5

## 2015-06-10 MED ORDER — DENOSUMAB 120 MG/1.7ML ~~LOC~~ SOLN
120.0000 mg | Freq: Once | SUBCUTANEOUS | Status: AC
  Administered 2015-06-10: 120 mg via SUBCUTANEOUS
  Filled 2015-06-10: qty 1.7

## 2015-06-10 MED ORDER — METHYLPREDNISOLONE 4 MG PO TBPK: ORAL_TABLET | ORAL | Status: DC

## 2015-06-10 MED FILL — METHYLPREDNISOLONE 4 MG TAB: 4 | 6 days supply | Qty: 21 | Fill #0

## 2015-06-10 MED FILL — BENZONATATE 200 MG CAPSULE: 200 | 10 days supply | Qty: 30 | Fill #0

## 2015-06-10 NOTE — Patient Instructions (Signed)
Leuprolide depot injection What is this medicine? LEUPROLIDE (loo PROE lide) is a man-made protein that acts like a natural hormone in the body. It decreases testosterone in men and decreases estrogen in women. In men, this medicine is used to treat advanced prostate cancer. In women, some forms of this medicine may be used to treat endometriosis, uterine fibroids, or other male hormone-related problems. This medicine may be used for other purposes; ask your health care provider or pharmacist if you have questions. What should I tell my health care provider before I take this medicine? They need to know if you have any of these conditions: -diabetes -heart disease or previous heart attack -high blood pressure -high cholesterol -osteoporosis -pain or difficulty passing urine -spinal cord metastasis -stroke -tobacco smoker -unusual vaginal bleeding (women) -an unusual or allergic reaction to leuprolide, benzyl alcohol, other medicines, foods, dyes, or preservatives -pregnant or trying to get pregnant -breast-feeding How should I use this medicine? This medicine is for injection into a muscle or for injection under the skin. It is given by a health care professional in a hospital or clinic setting. The specific product will determine how it will be given to you. Make sure you understand which product you receive and how often you will receive it. Talk to your pediatrician regarding the use of this medicine in children. Special care may be needed. Overdosage: If you think you have taken too much of this medicine contact a poison control center or emergency room at once. NOTE: This medicine is only for you. Do not share this medicine with others. What if I miss a dose? It is important not to miss a dose. Call your doctor or health care professional if you are unable to keep an appointment. Depot injections: Depot injections are given either once-monthly, every 12 weeks, every 16 weeks, or  every 24 weeks depending on the product you are prescribed. The product you are prescribed will be based on if you are male or male, and your condition. Make sure you understand your product and dosing. What may interact with this medicine? Do not take this medicine with any of the following medications: -chasteberry This medicine may also interact with the following medications: -herbal or dietary supplements, like black cohosh or DHEA -male hormones, like estrogens or progestins and birth control pills, patches, rings, or injections -male hormones, like testosterone This list may not describe all possible interactions. Give your health care provider a list of all the medicines, herbs, non-prescription drugs, or dietary supplements you use. Also tell them if you smoke, drink alcohol, or use illegal drugs. Some items may interact with your medicine. What should I watch for while using this medicine? Visit your doctor or health care professional for regular checks on your progress. During the first weeks of treatment, your symptoms may get worse, but then will improve as you continue your treatment. You may get hot flashes, increased bone pain, increased difficulty passing urine, or an aggravation of nerve symptoms. Discuss these effects with your doctor or health care professional, some of them may improve with continued use of this medicine. Male patients may experience a menstrual cycle or spotting during the first months of therapy with this medicine. If this continues, contact your doctor or health care professional. What side effects may I notice from receiving this medicine? Side effects that you should report to your doctor or health care professional as soon as possible: -allergic reactions like skin rash, itching or hives, swelling of the  face, lips, or tongue -breathing problems -chest pain -depression or memory disorders -pain in your legs or groin -pain at site where injected or  implanted -severe headache -swelling of the feet and legs -visual changes -vomiting Side effects that usually do not require medical attention (report to your doctor or health care professional if they continue or are bothersome): -breast swelling or tenderness -decrease in sex drive or performance -diarrhea -hot flashes -loss of appetite -muscle, joint, or bone pains -nausea -redness or irritation at site where injected or implanted -skin problems or acne This list may not describe all possible side effects. Call your doctor for medical advice about side effects. You may report side effects to FDA at 1-800-FDA-1088. Where should I keep my medicine? This drug is given in a hospital or clinic and will not be stored at home. NOTE: This sheet is a summary. It may not cover all possible information. If you have questions about this medicine, talk to your doctor, pharmacist, or health care provider.    2016, Elsevier/Gold Standard. (2013-09-25 14:16:23) Denosumab injection What is this medicine? DENOSUMAB (den oh sue mab) slows bone breakdown. Prolia is used to treat osteoporosis in women after menopause and in men. Delton See is used to prevent bone fractures and other bone problems caused by cancer bone metastases. Delton See is also used to treat giant cell tumor of the bone. This medicine may be used for other purposes; ask your health care provider or pharmacist if you have questions. What should I tell my health care provider before I take this medicine? They need to know if you have any of these conditions: -dental disease -eczema -infection or history of infections -kidney disease or on dialysis -low blood calcium or vitamin D -malabsorption syndrome -scheduled to have surgery or tooth extraction -taking medicine that contains denosumab -thyroid or parathyroid disease -an unusual reaction to denosumab, other medicines, foods, dyes, or preservatives -pregnant or trying to get  pregnant -breast-feeding How should I use this medicine? This medicine is for injection under the skin. It is given by a health care professional in a hospital or clinic setting. If you are getting Prolia, a special MedGuide will be given to you by the pharmacist with each prescription and refill. Be sure to read this information carefully each time. For Prolia, talk to your pediatrician regarding the use of this medicine in children. Special care may be needed. For Delton See, talk to your pediatrician regarding the use of this medicine in children. While this drug may be prescribed for children as young as 13 years for selected conditions, precautions do apply. Overdosage: If you think you have taken too much of this medicine contact a poison control center or emergency room at once. NOTE: This medicine is only for you. Do not share this medicine with others. What if I miss a dose? It is important not to miss your dose. Call your doctor or health care professional if you are unable to keep an appointment. What may interact with this medicine? Do not take this medicine with any of the following medications: -other medicines containing denosumab This medicine may also interact with the following medications: -medicines that suppress the immune system -medicines that treat cancer -steroid medicines like prednisone or cortisone This list may not describe all possible interactions. Give your health care provider a list of all the medicines, herbs, non-prescription drugs, or dietary supplements you use. Also tell them if you smoke, drink alcohol, or use illegal drugs. Some items may interact with  your medicine. What should I watch for while using this medicine? Visit your doctor or health care professional for regular checks on your progress. Your doctor or health care professional may order blood tests and other tests to see how you are doing. Call your doctor or health care professional if you get a  cold or other infection while receiving this medicine. Do not treat yourself. This medicine may decrease your body's ability to fight infection. You should make sure you get enough calcium and vitamin D while you are taking this medicine, unless your doctor tells you not to. Discuss the foods you eat and the vitamins you take with your health care professional. See your dentist regularly. Brush and floss your teeth as directed. Before you have any dental work done, tell your dentist you are receiving this medicine. Do not become pregnant while taking this medicine or for 5 months after stopping it. Women should inform their doctor if they wish to become pregnant or think they might be pregnant. There is a potential for serious side effects to an unborn child. Talk to your health care professional or pharmacist for more information. What side effects may I notice from receiving this medicine? Side effects that you should report to your doctor or health care professional as soon as possible: -allergic reactions like skin rash, itching or hives, swelling of the face, lips, or tongue -breathing problems -chest pain -fast, irregular heartbeat -feeling faint or lightheaded, falls -fever, chills, or any other sign of infection -muscle spasms, tightening, or twitches -numbness or tingling -skin blisters or bumps, or is dry, peels, or red -slow healing or unexplained pain in the mouth or jaw -unusual bleeding or bruising Side effects that usually do not require medical attention (Report these to your doctor or health care professional if they continue or are bothersome.): -muscle pain -stomach upset, gas This list may not describe all possible side effects. Call your doctor for medical advice about side effects. You may report side effects to FDA at 1-800-FDA-1088. Where should I keep my medicine? This medicine is only given in a clinic, doctor's office, or other health care setting and will not be  stored at home. NOTE: This sheet is a summary. It may not cover all possible information. If you have questions about this medicine, talk to your doctor, pharmacist, or health care provider.    2016, Elsevier/Gold Standard. (2011-07-02 12:37:47) 

## 2015-06-10 NOTE — Progress Notes (Signed)
Hematology and Oncology Follow Up Visit  [name] [id number] [birth year] 70 y.o. 06/10/2015   Principle Diagnosis:   Metastatic castrate resistant prostate cancer  Current Therapy:    Zytiga 1000 mg by mouth daily  Xgeva 120 mg subcutaneous Q3 months - due in  June  Lupron 22 mg IM every 3 months - due in June     Interim History:  [name] is back for followup. He is doing quite well. He is still working. He is trying to cut back a little bit on work.  His back and right leg issues are better. It sounds like he had a pinched nerve. He was seen a Restaurant manager, fast food. The chiropractor seemed to help him out quite a bit.  His PSA has been non-detectable. Back in April, his PSA was less than 0.1. His testosterone was <10.  His problem right now that he has had a cough for 4 weeks. It is not productive. He's been on cough medicine. He cannot tolerate codeine. He is currently on some Tessalon Perles.  I will go ahead and try a Medrol Dosepak on him. Again he may have a little bit of tracheobronchitis. He's had no problems with bowels or bladder. His is trying to watch what he eats and trying to manage his blood sugars. He is on glimepiride which is helping control his blood sugars pretty well. His low-dose prednisone, that he takes with his Zytiga, probably is the main culprit for his hyperglycemia.  His performance status is ECOG 1.   Medications:  Current outpatient prescriptions:  .  aspirin 81 MG tablet, Take 81 mg by mouth daily., Disp: , Rfl:  .  benzonatate (TESSALON) 100 MG capsule, Take 100 mg by mouth 3 (three) times daily as needed for cough., Disp: , Rfl:  .  Calcium Carbonate-Vitamin D (CALCIUM + D PO), Take 500 mg by mouth 2 (two) times daily., Disp: , Rfl:  .  cetirizine (ZYRTEC) 10 MG tablet, Take 10 mg by mouth at bedtime., Disp: , Rfl:  .  gabapentin (NEURONTIN) 100 MG capsule, TAKE 1 CAPSULE BY MOUTH EVERY 8 HOURS, Disp: 90 capsule, Rfl: 6 .  glimepiride (AMARYL)  1 MG tablet, TAKE 2 TABLETS BY MOUTH DAILY WITH BREAKFAST, Disp: 60 tablet, Rfl: 4 .  isosorbide mononitrate (IMDUR) 30 MG 24 hr tablet, Take 30 mg by mouth daily., Disp: , Rfl:  .  meclizine (ANTIVERT) 25 MG tablet, as needed. , Disp: , Rfl:  .  metFORMIN (GLUCOPHAGE-XR) 500 MG 24 hr tablet, TAKE 2 TABLETS (1,000 MG TOTAL) BY MOUTH EVERY MORNING WITH BREAKFAST., Disp: 60 tablet, Rfl: 4 .  Multiple Minerals-Vitamins (CALCIUM & VIT D3 BONE HEALTH PO), Take by mouth every morning. , Disp: , Rfl:  .  OVER THE COUNTER MEDICATION, every morning. Juice Plus, Disp: , Rfl:  .  predniSONE (DELTASONE) 5 MG tablet, TAKE 1 TABLET BY MOUTH DAILY, Disp: 60 tablet, Rfl: 3 .  ZETIA 10 MG tablet, 10 mg daily., Disp: , Rfl:  .  ZYTIGA 250 MG tablet, TAKE 4 TABLETS BY MOUTH DAILY. TAKE ON AN EMPTY STOMACH 1 HOUR BEFORE OR 2 HOURS AFTER A MEAL, Disp: 120 tablet, Rfl: 3 .  methylPREDNISolone (MEDROL DOSEPAK) 4 MG TBPK tablet, Take as directed, Disp: 21 tablet, Rfl: 0  Allergies:  Allergies  Allergen Reactions  . Codeine Nausea And Vomiting  . Hydrocodone Nausea And Vomiting    Past Medical History, Surgical history, Social history, and Family History were reviewed and updated.  Review of Systems: As above  Physical Exam:  height is '5\' 9"'$  (1.753 m) and weight is 156 lb (70.761 kg). His oral temperature is 97.4 F (36.3 C). His blood pressure is 123/61 and his pulse is 71. His respiration is 16.   Well-developed and well-nourished white male. Head and neck exam shows no ocular or oral lesions. The are no palpable cervical or supraclavicular lymph nodes. Lungs are clear with no rales, wheezes or rhonchi. Cardiac exam regular rate and rhythm with no murmurs rubs or bruits. Abdomen is soft. He has good bowel sounds. There is no fluid wave. There is no palpable liver or spleen tip. Back exam shows no tenderness over the spine ribs or hips. I cannot elicit any tenderness over the sacrum bilaterally. He has good range  of motion of his pelvis. Extremities shows no clubbing, cyanosis or edema. Skin exam shows areas of urticaria on the left forearm.. There is also an area as solitary in the upper chest wall in the upper sternal region. These are nontender. There are erythematous with a central area of vesicle. No erythema to the skin is noted. Neurological exam is nonfocal.   Lab Results  Component Value Date   WBC 6.1 06/10/2015   HGB 12.7* 06/10/2015   HCT 38.4* 06/10/2015   MCV 96 06/10/2015   PLT 261 06/10/2015     Chemistry      Component Value Date/Time   NA 138 06/10/2015 0930   NA 141 04/29/2015 0914   NA 141 09/17/2014 1423   K 3.4 06/10/2015 0930   K 4.0 04/29/2015 0914   K 4.5 09/17/2014 1423   CL 98 06/10/2015 0930   CL 102 09/17/2014 1423   CO2 30 06/10/2015 0930   CO2 28 04/29/2015 0914   CO2 30 09/17/2014 1423   BUN 16 06/10/2015 0930   BUN 16.1 04/29/2015 0914   BUN 15 09/17/2014 1423   CREATININE 1.3* 06/10/2015 0930   CREATININE 1.3 04/29/2015 0914   CREATININE 1.05 09/17/2014 1423      Component Value Date/Time   CALCIUM 10.0 06/10/2015 0930   CALCIUM 10.2 04/29/2015 0914   CALCIUM 9.8 09/17/2014 1423   ALKPHOS 72 06/10/2015 0930   ALKPHOS 63 04/29/2015 0914   ALKPHOS 54 09/17/2014 1423   AST 20 06/10/2015 0930   AST 16 04/29/2015 0914   AST 14 09/17/2014 1423   ALT 23 06/10/2015 0930   ALT 11 04/29/2015 0914   ALT 12 09/17/2014 1423   BILITOT 0.60 06/10/2015 0930   BILITOT 0.46 04/29/2015 0914   BILITOT 0.3 09/17/2014 1423         Impression and Plan: [name] is 70 year old gentleman with metastatic prostate cancer.   He is doing quite well.We will see what his PSA is. I would have to think that the PSA should be okay.  I will get a chest x-ray on him. I'm not sure why has his chronic cough. I will also put him on a Medrol dosepak. He may have some kind of tracheobronchitis.  We will see him back in about 6 weeks.    [name],  MD 5/26/201710:38 AM

## 2015-06-11 LAB — PSA: Prostate Specific Ag, Serum: <0.1 ng/mL (ref 0.0–4.0)

## 2015-06-11 LAB — TESTOSTERONE: Testosterone, Serum: <3 ng/dL — ABNORMAL LOW (ref 348–1197)

## 2015-06-14 ENCOUNTER — Telehealth: Payer: Self-pay | Admitting: *Deleted

## 2015-06-14 NOTE — Telephone Encounter (Addendum)
Patient aware of results  ----- Message from [name], MD sent at 06/11/2015  7:09 AM EDT ----- Call - PSA still not detectable!!!  [name]

## 2015-06-21 MED FILL — ISOSORBIDE MN ER 30 MG TAB: 30 | 30 days supply | Qty: 30 | Fill #6

## 2015-06-21 MED FILL — GLIMEPIRIDE 1 MG TABLET: 1 | 30 days supply | Qty: 60 | Fill #2

## 2015-06-22 MED FILL — EZETIMIBE 10 MG TABLET: 10 | 30 days supply | Qty: 30 | Fill #0

## 2015-06-29 DIAGNOSIS — D1801 Hemangioma of skin and subcutaneous tissue: Secondary | ICD-10-CM | POA: Diagnosis not present

## 2015-06-29 DIAGNOSIS — L820 Inflamed seborrheic keratosis: Secondary | ICD-10-CM | POA: Diagnosis not present

## 2015-06-29 DIAGNOSIS — D225 Melanocytic nevi of trunk: Secondary | ICD-10-CM | POA: Diagnosis not present

## 2015-07-06 ENCOUNTER — Other Ambulatory Visit: Payer: Self-pay | Admitting: *Deleted

## 2015-07-12 DIAGNOSIS — I10 Essential (primary) hypertension: Secondary | ICD-10-CM | POA: Diagnosis not present

## 2015-07-12 DIAGNOSIS — Z6824 Body mass index (BMI) 24.0-24.9, adult: Secondary | ICD-10-CM | POA: Diagnosis not present

## 2015-07-12 DIAGNOSIS — I209 Angina pectoris, unspecified: Secondary | ICD-10-CM | POA: Diagnosis not present

## 2015-07-12 DIAGNOSIS — E785 Hyperlipidemia, unspecified: Secondary | ICD-10-CM | POA: Diagnosis not present

## 2015-07-15 MED FILL — ZYTIGA 250 MG TABLET: 250 | 30 days supply | Qty: 120 | Fill #2

## 2015-07-15 MED FILL — ISOSORBIDE MN ER 30 MG TAB: 30 | 90 days supply | Qty: 90 | Fill #0

## 2015-07-22 ENCOUNTER — Encounter: Payer: Self-pay | Admitting: Hematology & Oncology

## 2015-07-22 ENCOUNTER — Ambulatory Visit (HOSPITAL_BASED_OUTPATIENT_CLINIC_OR_DEPARTMENT_OTHER): Payer: Medicare Other | Admitting: Hematology & Oncology

## 2015-07-22 ENCOUNTER — Other Ambulatory Visit (HOSPITAL_BASED_OUTPATIENT_CLINIC_OR_DEPARTMENT_OTHER): Payer: Medicare Other

## 2015-07-22 VITALS — BP 121/64 | HR 82 | Temp 97.6°F | Resp 16 | Ht 69.0 in | Wt 160.0 lb

## 2015-07-22 DIAGNOSIS — C61 Malignant neoplasm of prostate: Secondary | ICD-10-CM

## 2015-07-22 DIAGNOSIS — J209 Acute bronchitis, unspecified: Secondary | ICD-10-CM | POA: Diagnosis not present

## 2015-07-22 LAB — COMPREHENSIVE METABOLIC PANEL
ALT: 12 U/L (ref 0–55)
AST: 16 U/L (ref 5–34)
Albumin: 3.7 g/dL (ref 3.5–5.0)
Alkaline Phosphatase: 76 U/L (ref 40–150)
Anion Gap: 10 mEq/L (ref 3–11)
BUN: 20.9 mg/dL (ref 7.0–26.0)
CO2: 28 mEq/L (ref 22–29)
Calcium: 9.8 mg/dL (ref 8.4–10.4)
Chloride: 102 mEq/L (ref 98–109)
Creatinine: 1.3 mg/dL (ref 0.7–1.3)
EGFR: 55 mL/min/{1.73_m2} — ABNORMAL LOW (ref >90)
Glucose: 104 mg/dl (ref 70–140)
Potassium: 3.9 mEq/L (ref 3.5–5.1)
Sodium: 141 mEq/L (ref 136–145)
Total Bilirubin: 0.36 mg/dL (ref 0.20–1.20)
Total Protein: 7 g/dL (ref 6.4–8.3)

## 2015-07-22 LAB — CBC WITH DIFFERENTIAL (CANCER CENTER ONLY)
BASO#: 0 10*3/uL (ref 0.0–0.2)
BASO%: 0.4 % (ref 0.0–2.0)
EOS%: 1.7 % (ref 0.0–7.0)
Eosinophils Absolute: 0.1 10*3/uL (ref 0.0–0.5)
HCT: 36.7 % — ABNORMAL LOW (ref 38.7–49.9)
HGB: 12 g/dL — ABNORMAL LOW (ref 13.0–17.1)
LYMPH#: 1.4 10*3/uL (ref 0.9–3.3)
LYMPH%: 19.5 % (ref 14.0–48.0)
MCH: 32 pg (ref 28.0–33.4)
MCHC: 32.7 g/dL (ref 32.0–35.9)
MCV: 98 fL (ref 82–98)
MONO#: 0.6 10*3/uL (ref 0.1–0.9)
MONO%: 8.3 % (ref 0.0–13.0)
NEUT#: 5 10*3/uL (ref 1.5–6.5)
NEUT%: 70.1 % (ref 40.0–80.0)
Platelets: 221 10*3/uL (ref 145–400)
RBC: 3.75 10*6/uL — ABNORMAL LOW (ref 4.20–5.70)
RDW: 13.4 % (ref 11.1–15.7)
WBC: 7.1 10*3/uL (ref 4.0–10.0)

## 2015-07-22 MED FILL — EZETIMIBE 10 MG TABLET: 10 | 30 days supply | Qty: 30 | Fill #1

## 2015-07-22 MED FILL — predniSONE 5 MG TABS: 5 | 60 days supply | Qty: 60 | Fill #2

## 2015-07-22 MED FILL — METFORMIN HCL ER 500 MG TAB: 500 | 30 days supply | Qty: 60 | Fill #2

## 2015-07-22 MED FILL — GLIMEPIRIDE 1 MG TABLET: 1 | 30 days supply | Qty: 60 | Fill #3

## 2015-07-22 NOTE — Progress Notes (Signed)
Hematology and Oncology Follow Up Visit  [name] [id number] [birth year] 70 y.o. 07/22/2015   Principle Diagnosis:   Metastatic castrate resistant prostate cancer  Current Therapy:    Zytiga 1000 mg by mouth daily  Xgeva 120 mg subcutaneous Q3 months - due in  Sept  Lupron 22 mg IM every 3 months - due in Sept     Interim History:  [name] is back for followup. He is doing quite well. He is still working. He is trying to cut back a little bit on work.  He is having more problems with pain in the lateral right thigh. It seems to wake him up at nighttime. He's had no weakness. He's had no swelling. There is no obvious back pain. He's had no change in bowel or bladder habits.   His PSA has been almost nondetectable. This is really done well.   He is watching his blood sugars. He is having no problems with cough or shortness of breath. He's had no fever.   He saw his cardiologist last week. Everything looked good with his heart.   He has had no problems with the medications. He's tolerating the Zytiga quite well.   His performance status is ECOG 1.   Medications:  Current outpatient prescriptions:  .  aspirin 81 MG tablet, Take 81 mg by mouth daily., Disp: , Rfl:  .  benzonatate (TESSALON) 100 MG capsule, Take 100 mg by mouth 3 (three) times daily as needed for cough., Disp: , Rfl:  .  Calcium Carbonate-Vitamin D (CALCIUM + D PO), Take 500 mg by mouth 2 (two) times daily., Disp: , Rfl:  .  cetirizine (ZYRTEC) 10 MG tablet, Take 10 mg by mouth at bedtime., Disp: , Rfl:  .  gabapentin (NEURONTIN) 100 MG capsule, TAKE 1 CAPSULE BY MOUTH EVERY 8 HOURS, Disp: 90 capsule, Rfl: 6 .  glimepiride (AMARYL) 1 MG tablet, TAKE 2 TABLETS BY MOUTH DAILY WITH BREAKFAST, Disp: 60 tablet, Rfl: 4 .  isosorbide mononitrate (IMDUR) 30 MG 24 hr tablet, Take 30 mg by mouth daily., Disp: , Rfl:  .  metFORMIN (GLUCOPHAGE-XR) 500 MG 24 hr tablet, TAKE 2 TABLETS (1,000 MG TOTAL) BY MOUTH EVERY  MORNING WITH BREAKFAST., Disp: 60 tablet, Rfl: 4 .  Multiple Minerals-Vitamins (CALCIUM & VIT D3 BONE HEALTH PO), Take by mouth every morning. , Disp: , Rfl:  .  OVER THE COUNTER MEDICATION, every morning. Juice Plus, Disp: , Rfl:  .  predniSONE (DELTASONE) 5 MG tablet, TAKE 1 TABLET BY MOUTH DAILY, Disp: 60 tablet, Rfl: 3 .  ZETIA 10 MG tablet, 10 mg daily., Disp: , Rfl:  .  ZYTIGA 250 MG tablet, TAKE 4 TABLETS BY MOUTH DAILY. TAKE ON AN EMPTY STOMACH 1 HOUR BEFORE OR 2 HOURS AFTER A MEAL, Disp: 120 tablet, Rfl: 3 .  meclizine (ANTIVERT) 25 MG tablet, as needed. , Disp: , Rfl:   Allergies:  Allergies  Allergen Reactions  . Codeine Nausea And Vomiting  . Hydrocodone Nausea And Vomiting    Past Medical History, Surgical history, Social history, and Family History were reviewed and updated.  Review of Systems: As above  Physical Exam:  height is '5\' 9"'$  (1.753 m) and weight is 160 lb (72.576 kg). His oral temperature is 97.6 F (36.4 C). His blood pressure is 121/64 and his pulse is 82. His respiration is 16.   Well-developed and well-nourished white male. Head and neck exam shows no ocular or oral lesions. The are no  palpable cervical or supraclavicular lymph nodes. Lungs are clear with no rales, wheezes or rhonchi. Cardiac exam regular rate and rhythm with no murmurs rubs or bruits. Abdomen is soft. He has good bowel sounds. There is no fluid wave. There is no palpable liver or spleen tip. Back exam shows no tenderness over the spine ribs or hips. I cannot elicit any tenderness over the sacrum bilaterally. He has good range of motion of his pelvis. Extremities shows no clubbing, cyanosis or edema. Skin exam shows areas of urticaria on the left forearm.. There is also an area as solitary in the upper chest wall in the upper sternal region. These are nontender. There are erythematous with a central area of vesicle. No erythema to the skin is noted. Neurological exam is nonfocal.   Lab Results    Component Value Date   WBC 7.1 07/22/2015   HGB 12.0* 07/22/2015   HCT 36.7* 07/22/2015   MCV 98 07/22/2015   PLT 221 07/22/2015     Chemistry      Component Value Date/Time   NA 138 06/10/2015 0930   NA 141 04/29/2015 0914   NA 141 09/17/2014 1423   K 3.4 06/10/2015 0930   K 4.0 04/29/2015 0914   K 4.5 09/17/2014 1423   CL 98 06/10/2015 0930   CL 102 09/17/2014 1423   CO2 30 06/10/2015 0930   CO2 28 04/29/2015 0914   CO2 30 09/17/2014 1423   BUN 16 06/10/2015 0930   BUN 16.1 04/29/2015 0914   BUN 15 09/17/2014 1423   CREATININE 1.3* 06/10/2015 0930   CREATININE 1.3 04/29/2015 0914   CREATININE 1.05 09/17/2014 1423      Component Value Date/Time   CALCIUM 10.0 06/10/2015 0930   CALCIUM 10.2 04/29/2015 0914   CALCIUM 9.8 09/17/2014 1423   ALKPHOS 72 06/10/2015 0930   ALKPHOS 63 04/29/2015 0914   ALKPHOS 54 09/17/2014 1423   AST 20 06/10/2015 0930   AST 16 04/29/2015 0914   AST 14 09/17/2014 1423   ALT 23 06/10/2015 0930   ALT 11 04/29/2015 0914   ALT 12 09/17/2014 1423   BILITOT 0.60 06/10/2015 0930   BILITOT 0.46 04/29/2015 0914   BILITOT 0.3 09/17/2014 1423         Impression and Plan: [name] is 70 year old gentleman with metastatic prostate cancer.   I probably will order a bone scan on him. He has not had a bone scan for quite a while. I would think that this would be okay given his PSA having been almost nondetectable.  If there is on the bone scan, then we might want to get a CT or MRI.  As always, we will plan to get him back in another 6 weeks.    [name], MD 7/7/201710:28 AM

## 2015-07-23 LAB — PSA: Prostate Specific Ag, Serum: <0.1 ng/mL (ref 0.0–4.0)

## 2015-07-25 ENCOUNTER — Encounter: Payer: Self-pay | Admitting: *Deleted

## 2015-07-29 ENCOUNTER — Encounter (HOSPITAL_COMMUNITY)
Admission: RE | Admit: 2015-07-29 | Discharge: 2015-07-29 | Disposition: A | Discharge status: Home / Self Care | Payer: Medicare Other | Source: Ambulatory Visit | Attending: Hematology & Oncology | Admitting: Hematology & Oncology

## 2015-07-29 ENCOUNTER — Ambulatory Visit (HOSPITAL_COMMUNITY)
Admission: RE | Admit: 2015-07-29 | Discharge: 2015-07-29 | Disposition: A | Discharge status: Home / Self Care | Payer: Medicare Other | Source: Ambulatory Visit | Attending: Hematology & Oncology | Admitting: Hematology & Oncology

## 2015-07-29 DIAGNOSIS — C7951 Secondary malignant neoplasm of bone: Secondary | ICD-10-CM | POA: Diagnosis not present

## 2015-07-29 DIAGNOSIS — C61 Malignant neoplasm of prostate: Secondary | ICD-10-CM | POA: Insufficient documentation

## 2015-07-29 MED ORDER — TECHNETIUM TC 99M MEDRONATE IV KIT
26.2000 | PACK | Freq: Once | INTRAVENOUS | Status: AC | PRN
  Administered 2015-07-29: 26.2 via INTRAVENOUS

## 2015-08-03 ENCOUNTER — Other Ambulatory Visit: Payer: Self-pay

## 2015-08-03 DIAGNOSIS — E785 Hyperlipidemia, unspecified: Secondary | ICD-10-CM | POA: Diagnosis not present

## 2015-08-03 DIAGNOSIS — Z1389 Encounter for screening for other disorder: Secondary | ICD-10-CM | POA: Diagnosis not present

## 2015-08-03 DIAGNOSIS — E119 Type 2 diabetes mellitus without complications: Secondary | ICD-10-CM | POA: Diagnosis not present

## 2015-08-03 DIAGNOSIS — Z6824 Body mass index (BMI) 24.0-24.9, adult: Secondary | ICD-10-CM | POA: Diagnosis not present

## 2015-08-03 DIAGNOSIS — C61 Malignant neoplasm of prostate: Secondary | ICD-10-CM | POA: Diagnosis not present

## 2015-08-03 DIAGNOSIS — Z0000 Encounter for general adult medical examination without abnormal findings: Secondary | ICD-10-CM | POA: Diagnosis not present

## 2015-08-03 MED ORDER — GLIMEPIRIDE 1 MG PO TABS: 2.0000 mg | ORAL_TABLET | Freq: Every day | ORAL | Status: DC

## 2015-08-03 MED ORDER — METFORMIN HCL ER 500 MG PO TB24: 1000.0000 mg | ORAL_TABLET | Freq: Every day | ORAL | Status: DC

## 2015-08-03 NOTE — Progress Notes (Signed)
Histology and Location of Primary Cancer: Metastatic castrate resistant prostate cancer  Sites of Visceral and Bony Metastatic Disease: "right sacrum and at least 1 area of increased uptake in the lower lumbar spine, specifically L4" seen on bone scan from 07/29/15.  Past/Anticipated chemotherapy by medical oncology, if any: Zytiga 1000 mg by mouth daily, Xgeva 120 mg subcutaneous Q3 months - due in Sept and Lupron 22 mg IM every 3 months - due in Sept  Pain on a scale of 0-10 is: 0 but does have pain in the back of his upper right leg.   If Spine Met(s), symptoms, if any, include:  Bowel/Bladder retention or incontinence (please describe): no  Numbness or weakness in extremities (please describe): no  Current Decadron regimen, if applicable: taking prednisone 5 mg daily.  Ambulatory status? Walker? Wheelchair?: Ambulatory  SAFETY ISSUES:  Prior radiation? 04/22/14-05/06/14 - right sacrum 30 gray  Pacemaker/ICD? no  Possible current pregnancy? no  Is the patient on methotrexate? no  Current Complaints / other details:  Patient is having pain in his right lateral thigh. Patient is here with his wife and sister.  BP 130/66 (BP Location: Right Arm, Patient Position: Sitting)   Pulse 90   Temp 98.6 F (37 C) (Oral)   Ht _0  (1.753 m)   Wt 163 lb 1.6 oz (74 kg)   SpO2 100%   BMI 24.09 kg/m     Wt Readings from Last 3 Encounters:  08/08/15 163 lb 1.6 oz (74 kg)  07/22/15 160 lb (72.6 kg)  06/10/15 156 lb (70.8 kg)

## 2015-08-08 ENCOUNTER — Encounter: Payer: Self-pay | Admitting: Radiation Oncology

## 2015-08-08 ENCOUNTER — Ambulatory Visit
Admission: RE | Admit: 2015-08-08 | Discharge: 2015-08-08 | Disposition: A | Discharge status: Home / Self Care | Payer: Medicare Other | Source: Ambulatory Visit | Attending: Radiation Oncology | Admitting: Radiation Oncology

## 2015-08-08 ENCOUNTER — Telehealth: Payer: Self-pay | Admitting: *Deleted

## 2015-08-08 VITALS — BP 130/66 | HR 90 | Temp 98.6°F | Ht 69.0 in | Wt 163.1 lb

## 2015-08-08 DIAGNOSIS — C61 Malignant neoplasm of prostate: Secondary | ICD-10-CM | POA: Insufficient documentation

## 2015-08-08 DIAGNOSIS — Z510 Encounter for antineoplastic radiation therapy: Secondary | ICD-10-CM | POA: Insufficient documentation

## 2015-08-08 DIAGNOSIS — Z7982 Long term (current) use of aspirin: Secondary | ICD-10-CM | POA: Insufficient documentation

## 2015-08-08 DIAGNOSIS — C7951 Secondary malignant neoplasm of bone: Secondary | ICD-10-CM

## 2015-08-08 DIAGNOSIS — Z79899 Other long term (current) drug therapy: Secondary | ICD-10-CM | POA: Insufficient documentation

## 2015-08-08 DIAGNOSIS — R739 Hyperglycemia, unspecified: Secondary | ICD-10-CM | POA: Insufficient documentation

## 2015-08-08 DIAGNOSIS — Z7984 Long term (current) use of oral hypoglycemic drugs: Secondary | ICD-10-CM | POA: Insufficient documentation

## 2015-08-08 NOTE — Progress Notes (Signed)
Please see the Nurse Progress Note in the MD Initial Consult Encounter for this patient. 

## 2015-08-08 NOTE — Addendum Note (Signed)
Encounter addended by: [name], MD on: 08/08/2015 12:49 PM<BR>    Actions taken: Order Entry activity accessed, Diagnosis association updated

## 2015-08-08 NOTE — Progress Notes (Signed)
Radiation Oncology         [phone] ________________________________  Name: [name] MRN: [id number]  Date: 08/08/2015  DOB: [birth year]  Re-Evaluation Visit Note  CC: Nicoletta Dress, MD  [name], MD  Diagnosis:   Metastatic castrate resistant prostate cancer.   Interval since last radiation: He received radiation 47/2016-05/06/2014 to the right sacrum to a total dose of 30 Gy.  Narrative:  [name] here for re-evaluation. He recently started having increasing pain in his lateral right thigh. Dr. Marin Olp ordered a bone scan when the patient went to him with this leg pain. He had a bone scan 07/29/2015, revealing the right sacrum and at least 1 area of increased uptake in the lower lumbar spine, specially L4. His PSA has remained almost nondetectable.  He has been receiving Zytiga 1000 mg by mouth daily, Xgeva 120 mg subcutaneous every 3 months, and Lupron 22 mg IM every 3 months. He is due in September for [place] and Lupron.  He mentions that when his leg hurts, he takes Tylenol and alternates with tramadol. He denies weakness, numbness, or tingling in his right leg. He mentions he has recently had a burning sensation in his heel. He mentions that his pain today is a 0/10. He denies bowel or bladder retention or incontinence. He is taking 5 mg of prednisone daily. He is accompanied by his wife and sister.   ALLERGIES:  is allergic to codeine and hydrocodone.  Meds: Current Outpatient Prescriptions  Medication Sig Dispense Refill  . aspirin 81 MG tablet Take 81 mg by mouth daily.    . Calcium Carbonate-Vitamin D (CALCIUM + D PO) Take 500 mg by mouth 2 (two) times daily.    Marland Kitchen gabapentin (NEURONTIN) 100 MG capsule TAKE 1 CAPSULE BY MOUTH EVERY 8 HOURS 90 capsule 6  . glimepiride (AMARYL) 1 MG tablet Take 2 tablets (2 mg total) by mouth daily with breakfast. 180 tablet 2  . ipratropium (ATROVENT) 0.06 % nasal spray Place 1 spray into both nostrils 4 (four) times  daily.    . isosorbide mononitrate (IMDUR) 30 MG 24 hr tablet Take 30 mg by mouth daily.    . metFORMIN (GLUCOPHAGE-XR) 500 MG 24 hr tablet Take 2 tablets (1,000 mg total) by mouth daily with breakfast. 180 tablet 2  . Multiple Minerals-Vitamins (CALCIUM & VIT D3 BONE HEALTH PO) Take by mouth every morning.     Marland Kitchen OVER THE COUNTER MEDICATION every morning. Juice Plus    . predniSONE (DELTASONE) 5 MG tablet TAKE 1 TABLET BY MOUTH DAILY 60 tablet 3  . ZETIA 10 MG tablet 10 mg daily.    Marland Kitchen ZYTIGA 250 MG tablet TAKE 4 TABLETS BY MOUTH DAILY. TAKE ON AN EMPTY STOMACH 1 HOUR BEFORE OR 2 HOURS AFTER A MEAL 120 tablet 3  . cetirizine (ZYRTEC) 10 MG tablet Take 10 mg by mouth at bedtime.    . meclizine (ANTIVERT) 25 MG tablet as needed.      No current facility-administered medications for this encounter.     Physical Findings:  Vitals:   08/08/15 0927  BP: 130/66  Pulse: 90  Temp: 98.6 F (37 C)   General: Alert and oriented, in no acute distress HEENT: Head is normocephalic. Extraocular movements are intact. Oropharynx is clear. Neck: Neck is supple, no palpable cervical or supraclavicular lymphadenopathy. Heart: Regular in rate and rhythm with no murmurs, rubs, or gallops. Chest: Clear to auscultation bilaterally, with no rhonchi, wheezes, or rales. Abdomen:  Soft, nontender, nondistended, with no rigidity or guarding. Extremities: No cyanosis or edema. Lymphatics: see Neck Exam Skin: No concerning lesions. Musculoskeletal: symmetric strength and muscle tone throughout. Neurologic: Cranial nerves II through XII are grossly intact. No obvious focalities. Speech is fluent. Coordination is intact. Psychiatric: Judgment and insight are intact. Affect is appropriate.  No pain with palpation in the lower spine or sacrum area.  Lab Findings: Lab Results  Component Value Date   WBC 7.1 07/22/2015   HGB 12.0 (L) 07/22/2015   HCT 36.7 (L) 07/22/2015   MCV 98 07/22/2015   PLT 221 07/22/2015      Radiographic Findings: Nm Bone Scan Whole Body  Result Date: 07/29/2015 CLINICAL DATA:  Prostate carcinoma. Evaluate for metastatic disease. EXAM: NUCLEAR MEDICINE WHOLE BODY BONE SCAN TECHNIQUE: Whole body anterior and posterior images were obtained approximately 3 hours after intravenous injection of radiopharmaceutical. RADIOPHARMACEUTICALS:  26.2 mCi Technetium-12mMDP IV COMPARISON:  Multiple prior bone scans, most recent dated 06/29/2014. PET-CT, 07/26/2011. FINDINGS: Uptake along the right upper sacrum has increased in extent from the most recent bone scan and from the older studies. There is an area of increased uptake along the left aspect of the L4 vertebra, increased from the prior exam. Other uptake in the lumbar spine is similar to the most recent prior exam. There is uptake along the posterior mid and lower thoracic spine similar to the prior study. There are no new areas of abnormal radiotracer localization. Stable degenerative uptake is noted involving the shoulders, sternoclavicular joints, and feet. Renal uptake is symmetric. IMPRESSION: Evidence of mild progression of metastatic disease to bone with an increase in the extent of uptake involving the right sacrum and at least 1 area of increased uptake in the lower lumbar spine, specifically L4. No convincing new metastatic lesions. Electronically Signed   By: DLajean ManesM.D.   On: 07/29/2015 12:06    Impression: [name] a 70yo gentleman with metastatic prostate cancer. The patient seems to be having radicular symptoms possibly from lumbosacral spine with pain radiating to his right leg and heel. To further evaluate this issue, we will order an MRI of the lumbosacral spine. Final details concerning his treatment are pending his MRI.  Plan: We discussed that we already treated in the sacrum area and would like to avoid treating that area again. We discussed Radium 223 injections to improve the pain and overall survival. We  discussed the side effects of the injections and can be managed with medication.   He will have an MRI scheduled and I will call him with the results. We will discuss his treatment options at that time.  ____________________________________  JBlair Promise PhD, MD    This document serves as a record of services personally performed by JGery Pray MD. It was created on his behalf by ALendon Collar a trained medical scribe. The creation of this record is based on the scribe's personal observations and the provider's statements to them. This document has been checked and approved by the attending provider.

## 2015-08-10 ENCOUNTER — Ambulatory Visit (HOSPITAL_COMMUNITY)
Admission: RE | Admit: 2015-08-10 | Discharge: 2015-08-10 | Disposition: A | Discharge status: Home / Self Care | Payer: Medicare Other | Source: Ambulatory Visit | Attending: Radiation Oncology | Admitting: Radiation Oncology

## 2015-08-10 DIAGNOSIS — M545 Low back pain: Secondary | ICD-10-CM | POA: Diagnosis not present

## 2015-08-10 DIAGNOSIS — C61 Malignant neoplasm of prostate: Secondary | ICD-10-CM | POA: Diagnosis not present

## 2015-08-10 DIAGNOSIS — M25551 Pain in right hip: Secondary | ICD-10-CM | POA: Diagnosis not present

## 2015-08-10 DIAGNOSIS — C7951 Secondary malignant neoplasm of bone: Secondary | ICD-10-CM | POA: Diagnosis not present

## 2015-08-10 DIAGNOSIS — M47816 Spondylosis without myelopathy or radiculopathy, lumbar region: Secondary | ICD-10-CM | POA: Diagnosis not present

## 2015-08-10 DIAGNOSIS — M25552 Pain in left hip: Secondary | ICD-10-CM | POA: Diagnosis not present

## 2015-08-10 MED ORDER — GADOBENATE DIMEGLUMINE 529 MG/ML IV SOLN
15.0000 mL | Freq: Once | INTRAVENOUS | Status: AC | PRN
  Administered 2015-08-10: 15 mL via INTRAVENOUS

## 2015-08-12 ENCOUNTER — Telehealth: Payer: Self-pay | Admitting: Oncology

## 2015-08-12 NOTE — Telephone Encounter (Signed)
[name] called and said he had an MRI yesterday and is anxious for the results.  Advised him that Dr. Sondra Come is out of the office today but will be back on Monday.  Etan verbalized understanding and agreement.

## 2015-08-16 MED FILL — EZETIMIBE 10 MG TABLET: 10 | 90 days supply | Qty: 90 | Fill #0

## 2015-08-16 MED FILL — ZYTIGA 250 MG TABLET: 250 | 30 days supply | Qty: 120 | Fill #3

## 2015-08-22 ENCOUNTER — Encounter: Payer: Self-pay | Admitting: Radiation Oncology

## 2015-08-22 ENCOUNTER — Ambulatory Visit
Admission: RE | Admit: 2015-08-22 | Discharge: 2015-08-22 | Disposition: A | Discharge status: Home / Self Care | Payer: Medicare Other | Source: Ambulatory Visit | Attending: Radiation Oncology | Admitting: Radiation Oncology

## 2015-08-22 VITALS — BP 119/65 | HR 86 | Temp 98.2°F | Resp 20 | Ht 69.0 in | Wt 158.5 lb

## 2015-08-22 DIAGNOSIS — C7951 Secondary malignant neoplasm of bone: Secondary | ICD-10-CM | POA: Diagnosis not present

## 2015-08-22 DIAGNOSIS — R739 Hyperglycemia, unspecified: Secondary | ICD-10-CM | POA: Diagnosis not present

## 2015-08-22 DIAGNOSIS — Z923 Personal history of irradiation: Secondary | ICD-10-CM | POA: Diagnosis not present

## 2015-08-22 DIAGNOSIS — Z7982 Long term (current) use of aspirin: Secondary | ICD-10-CM | POA: Diagnosis not present

## 2015-08-22 DIAGNOSIS — C61 Malignant neoplasm of prostate: Secondary | ICD-10-CM | POA: Diagnosis not present

## 2015-08-22 DIAGNOSIS — Z510 Encounter for antineoplastic radiation therapy: Secondary | ICD-10-CM | POA: Diagnosis not present

## 2015-08-22 DIAGNOSIS — Z79899 Other long term (current) drug therapy: Secondary | ICD-10-CM | POA: Diagnosis not present

## 2015-08-22 DIAGNOSIS — Z7984 Long term (current) use of oral hypoglycemic drugs: Secondary | ICD-10-CM | POA: Diagnosis not present

## 2015-08-22 NOTE — Progress Notes (Signed)
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   Prior radiation: Yes, he had radiation 04/22/2014 - 05/06/2014 to the right sacrum to a total dose of 30 Gy with Dr. Sondra Come.  Narrative:  The patient returns today for follow up. He met with Dr. Sondra Come 08/08/2015. He was originally diagnosed with prostate cancer in 2013. He mentions the pain in his sacrum was sporadic when it first started. From January 2016 - January 2017 he would have pain in his back thigh that would wake him up at night. He had a bone scan that showed more activity. He has been seeing a chiropractor and has not been having the pain as frequently. He has been taking Zytiga for the past two years.   He has an MRI of the lumbar spine and sacrum 08/10/2015. This showed a metastatic lesion in the right sacrum that extends into the epidural space on the right to the inferior margin of S1 and obliterates the right S2 foramen impinging on the right S2. This lesion also extends into the superior margin of the right S3 foramen and could impact the right S3 root. There was also a second 1 cm metastatic deposit in the posterior aspect of the left ilium. He is accompanied by his wife and sister.   On review of systems, the patient denies pain or nausea. He denies pain in the right lateral thigh that he stated "not in a while". He mentions he has cramps in his calves. He denies nausea, fever, chills, or abdominal pain. He mentions his appetite is good. He describes the pain in his right leg as a sharp pain and has had the sensation of burning in his heel. He denies pain in his right leg for the past 3 weeks. He denies bowel or bladder disturbances. He mentions he has no problems or pain  with his left leg. He denies any pain in his upper spine. He mentions his pain is mostly at night and doesn't notice a correlation with any specific movement. His sister mentions he is slow when he is getting up from sitting down.   Past Medical History:  Past Medical History:  Diagnosis Date  . High blood sugar   . Prostate cancer ([place]) 07/23/2011  . Radiation 04/22/14-05/06/14   right sacrum 30 gray    Past Surgical History: Past Surgical History:  Procedure Laterality Date  . CIRCUMCISION     when patient was 70 years old  . HAND SURGERY Left   . ROBOT ASSISTED LAPAROSCOPIC RADICAL PROSTATECTOMY    . TONSILECTOMY/ADENOIDECTOMY WITH MYRINGOTOMY      Social History:  Social History   Social History  . Marital status: Married    Spouse name: N/A  . Number of children: 2  . Years of education: N/A   Occupational History  . real estate aprais. [employer]   Social History Main Topics  . Smoking status: Never Smoker  . Smokeless tobacco: Never Used     Comment: never used tobacco  . Alcohol use No  . Drug use: Unknown  . Sexual activity: Not on file   Other Topics Concern  . Not on file   Social History Narrative  . No narrative on file  The patient is married and accompanied by his wife and sister.  Family History: Family History  Problem Relation Age of Onset  . Prostate cancer Father     ALLERGIES:  is allergic to codeine and hydrocodone.  Meds: Current Outpatient Prescriptions  Medication Sig Dispense Refill  . aspirin 81 MG tablet Take 81 mg by mouth daily.    . Calcium Carbonate-Vitamin D (CALCIUM + D PO) Take 500 mg by mouth 2 (two) times daily.    Marland Kitchen gabapentin (NEURONTIN) 100 MG capsule TAKE 1 CAPSULE BY MOUTH EVERY 8 HOURS 90 capsule 6  . glimepiride (AMARYL) 1 MG tablet Take 2 tablets (2 mg total) by mouth daily with breakfast. 180 tablet 2  . ipratropium (ATROVENT) 0.06 % nasal spray Place 1 spray into both nostrils 4 (four) times daily.      . isosorbide mononitrate (IMDUR) 30 MG 24 hr tablet Take 30 mg by mouth daily.    . metFORMIN (GLUCOPHAGE-XR) 500 MG 24 hr tablet Take 2 tablets (1,000 mg total) by mouth daily with breakfast. 180 tablet 2  . OVER THE COUNTER MEDICATION every morning. Juice Plus    . predniSONE (DELTASONE) 5 MG tablet TAKE 1 TABLET BY MOUTH DAILY 60 tablet 3  . ZETIA 10 MG tablet 10 mg daily.    Marland Kitchen ZYTIGA 250 MG tablet TAKE 4 TABLETS BY MOUTH DAILY. TAKE ON AN EMPTY STOMACH 1 HOUR BEFORE OR 2 HOURS AFTER A MEAL 120 tablet 3  . cetirizine (ZYRTEC) 10 MG tablet Take 10 mg by mouth at bedtime.    . meclizine (ANTIVERT) 25 MG tablet as needed.     . Multiple Minerals-Vitamins (CALCIUM & VIT D3 BONE HEALTH PO) Take by mouth every morning.      No current facility-administered medications for this encounter.     Physical Findings:  height is _0  (1.753 m) and weight is 158 lb 8 oz (71.9 kg). His oral temperature is 98.2 F (36.8 C). His blood pressure is 119/65 and his pulse is 86. His respiration is 20 and oxygen saturation is 100%.  In general this is a well appearing caucasian male in no acute distress. He is alert and oriented x4 and appropriate throughout the examination. HEENT reveals that the patient is normocephalic, atraumatic. EOMs are intact. PERRLA. Skin is intact without any evidence of gross lesions. Cardiovascular exam reveals a regular rate and rhythm, no clicks rubs or murmurs are auscultated. Chest is clear to auscultation bilaterally. Lymphatic assessment is performed and does not reveal any adenopathy in the cervical, supraclavicular, axillary, or inguinal chains. Abdomen has active bowel sounds in all quadrants and is intact. The abdomen is soft, non tender, non distended. Lower extremities are negative for pretibial pitting edema, deep calf tenderness, cyanosis or clubbing.   Lab Findings: Lab Results  Component Value Date   WBC 7.1 07/22/2015   HGB 12.0 (L) 07/22/2015   HCT 36.7 (L)  07/22/2015   MCV 98 07/22/2015   PLT 221 07/22/2015     Radiographic Findings: Mr Lumbar Spine W Wo Contrast  Result Date: 08/11/2015 CLINICAL DATA:  Low back and bilateral hip and leg pain in patient with history of metastatic prostate carcinoma. EXAM: MRI LUMBAR SPINE WITHOUT AND WITH CONTRAST TECHNIQUE: Multiplanar and multiecho pulse sequences of the lumbar spine were obtained without and with intravenous contrast. CONTRAST:  15 cc MultiHance IV. COMPARISON:  MRI lumbar spine 02/20/2014. Whole-body bone scan 07/29/2015. FINDINGS: Segmentation:  Unremarkable. Alignment:  Maintained. Vertebrae: Lesion in  the sacrum is partially visualized and described in report dedicated sacral MRI this same date. Conus medullaris: Extends to the L1 level and appears normal. Paraspinal and other soft tissues: Small left renal cyst is incidentally noted. Otherwise unremarkable. Disc levels: T11-12 and T12-L1 are imaged in the sagittal plane only and negative. L1-2:  Negative. L2-3: Mild facet degenerative change.  Otherwise negative. L3-4: Minimal disc bulge and mild facet degenerative change. The central canal and foramina are widely patent. L4-5: Shallow disc bulge mild facet degenerative change without central canal or foraminal stenosis. L5-S1: Moderate facet arthropathy and a shallow disc bulge with annular fissure is seen. The central canal and foramina are open. IMPRESSION: No change in mild spondylosis of the lumbar spine without central canal or foraminal stenosis. Negative for metastatic disease. Metastatic deposit in the sacrum is incompletely visualized. Please see report of dedicated sacral MRI this date. Electronically Signed   By: Inge Rise M.D.   On: 08/11/2015 08:16  Nm Bone Scan Whole Body  Result Date: 07/29/2015 CLINICAL DATA:  Prostate carcinoma. Evaluate for metastatic disease. EXAM: NUCLEAR MEDICINE WHOLE BODY BONE SCAN TECHNIQUE: Whole body anterior and posterior images were obtained  approximately 3 hours after intravenous injection of radiopharmaceutical. RADIOPHARMACEUTICALS:  26.2 mCi Technetium-62mMDP IV COMPARISON:  Multiple prior bone scans, most recent dated 06/29/2014. PET-CT, 07/26/2011. FINDINGS: Uptake along the right upper sacrum has increased in extent from the most recent bone scan and from the older studies. There is an area of increased uptake along the left aspect of the L4 vertebra, increased from the prior exam. Other uptake in the lumbar spine is similar to the most recent prior exam. There is uptake along the posterior mid and lower thoracic spine similar to the prior study. There are no new areas of abnormal radiotracer localization. Stable degenerative uptake is noted involving the shoulders, sternoclavicular joints, and feet. Renal uptake is symmetric. IMPRESSION: Evidence of mild progression of metastatic disease to bone with an increase in the extent of uptake involving the right sacrum and at least 1 area of increased uptake in the lower lumbar spine, specifically L4. No convincing new metastatic lesions. Electronically Signed   By: DLajean ManesM.D.   On: 07/29/2015 12:06   Mr Sacrum/si Joints W Wo Contrast  Result Date: 08/11/2015 CLINICAL DATA:  Low back and bilateral hip and leg pain in patient with history of metastatic prostate carcinoma. EXAM: MRI SACRUM WITHOUT AND WITH CONTRAST TECHNIQUE: Multi planar, multi sequence MR imaging of the sacrum was performed before and after the administration of IV contrast material. CONTRAST:  15 ml MULTIHANCE GADOBENATE DIMEGLUMINE 529 MG/ML IV SOLN COMPARISON:  Whole-body bone scan 07/29/2015. CT chest, abdomen and pelvis 04/02/2013. FINDINGS: The patient has an enhancing mass lesion in the right sacrum which measures approximately 5.2 cm craniocaudal by 3.2 cm a by 4.2 cm transverse. The lesion extends into the epidural space on the right just inferior to the S1 foramen and fills the S2 foramen impinging on the right  S2 root. The lesion also extends into the superior margin of the right S3 foramen and anteriorly into the piriformis muscle. It does not appear to extend through the sciatic notch. A second 1 cm in diameter metastatic deposit is seen in the posterior aspect of the left ilium. No other metastatic disease identified. With the exception of the patient's metastatic lesion, marrow signal in the sacrum and posterior ileum is is markedly T1 hyperintense consistent with prior radiation therapy. No lymphadenopathy is  identified in the pelvis. The patient is status post prostatectomy. IMPRESSION: Metastatic lesion in the right sacrum extends into the epidural space on the right to the inferior margin of S1 and obliterates the right S2 foramen impinging on the right S2. The lesion also extends into the superior margin of the right S3 foramen and could impact the right S3 root. A second 1 cm metastatic deposit in the posterior aspect of the left ilium is identified. Electronically Signed   By: Inge Rise M.D.   On: 08/11/2015 08:05   Impression/Plan: 1. Metastatic castrate resistant prostate cancer to the right sacrum and left ilium. Dr. Lisbeth Renshaw reviews his imaging studies and discusses treatment options. Although we could consider options for treatment including SBRT, he is not having significant symptoms, and his pain is intermittent and self limiting. With re-irradiating, there are concerns of toxicity even if SBRT was utilized. He is continuing on systemic therapy and we discussed the options for a repeat MRI in 6 months to determine if the mass in his sacrum is stable, as well as discussed the utility of Xofigo during those 6 months. We will refer him back to Dr. Sondra Come since the patient would like to proceed with Xofigo injections and he was previously treated by Dr. Sondra Come. He is in agreement to proceed with follow up MRI scan in 6 months.    The above documentation reflects my direct findings during this  shared patient visit. Please see the separate note by Dr. Lisbeth Renshaw on this date for the remainder of the patient's plan of care.     Carola Rhine, PAC      This document serves as a record of services personally performed by Dr. Lisbeth Renshaw, MD and Shona Simpson, [facility]. It was created on their behalf by Lendon Collar, a trained medical scribe. The creation of this record is based on the scribe's personal observations and the provider's statements to them. This document has been checked and approved by the attending provider.

## 2015-08-22 NOTE — Progress Notes (Signed)
Please see the Nurse Progress Note in the MD Initial Consult Encounter for this patient. 

## 2015-08-22 NOTE — Progress Notes (Addendum)
Follow up new consut  Sites visceral and bony metas, Sacrum  Saw Dr. Sondra Come on 08/08/15 ,   No c/o pain or nausea, no pain in right lateral thigh stated "Not in a while' Occasional   Cramps in his calfs   No changes since last consult note BP 119/65 (BP Location: Left Arm, Patient Position: Sitting, Cuff Size: Normal)   Pulse 86   Temp 98.2 F (36.8 C) (Oral)   Resp 20   Ht '5\' 9"'$  (1.753 m)   Wt 158 lb 8 oz (71.9 kg)   SpO2 100%   BMI 23.41 kg/m   Wt Readings from Last 3 Encounters:  08/22/15 158 lb 8 oz (71.9 kg)  08/08/15 163 lb 1.6 oz (74 kg)  08/10/15 165 lb (74.8 kg)   2:13 PM

## 2015-08-24 DIAGNOSIS — C7951 Secondary malignant neoplasm of bone: Secondary | ICD-10-CM

## 2015-08-24 HISTORY — DX: Secondary malignant neoplasm of bone: C79.51

## 2015-08-25 MED FILL — METFORMIN HCL ER 500 MG TAB: 500 | 30 days supply | Qty: 60 | Fill #3

## 2015-09-02 ENCOUNTER — Ambulatory Visit (HOSPITAL_BASED_OUTPATIENT_CLINIC_OR_DEPARTMENT_OTHER): Payer: Medicare Other

## 2015-09-02 ENCOUNTER — Ambulatory Visit (HOSPITAL_BASED_OUTPATIENT_CLINIC_OR_DEPARTMENT_OTHER): Payer: Medicare Other | Admitting: Hematology & Oncology

## 2015-09-02 ENCOUNTER — Other Ambulatory Visit (HOSPITAL_BASED_OUTPATIENT_CLINIC_OR_DEPARTMENT_OTHER): Payer: Medicare Other

## 2015-09-02 ENCOUNTER — Encounter: Payer: Self-pay | Admitting: Hematology & Oncology

## 2015-09-02 VITALS — BP 106/60 | HR 87 | Temp 98.1°F | Resp 16 | Ht 69.0 in | Wt 159.0 lb

## 2015-09-02 DIAGNOSIS — Z5111 Encounter for antineoplastic chemotherapy: Secondary | ICD-10-CM

## 2015-09-02 DIAGNOSIS — C7951 Secondary malignant neoplasm of bone: Secondary | ICD-10-CM

## 2015-09-02 DIAGNOSIS — C61 Malignant neoplasm of prostate: Secondary | ICD-10-CM | POA: Diagnosis not present

## 2015-09-02 LAB — CMP (CANCER CENTER ONLY)
ALT(SGPT): 13 U/L (ref 10–47)
AST: 19 U/L (ref 11–38)
Albumin: 3.4 g/dL (ref 3.3–5.5)
Alkaline Phosphatase: 65 U/L (ref 26–84)
BUN, Bld: 16 mg/dL (ref 7–22)
CO2: 28 mEq/L (ref 18–33)
Calcium: 9.7 mg/dL (ref 8.0–10.3)
Chloride: 102 mEq/L (ref 98–108)
Creat: 1.4 mg/dl — ABNORMAL HIGH (ref 0.6–1.2)
Glucose, Bld: 108 mg/dL (ref 73–118)
Potassium: 3.7 mEq/L (ref 3.3–4.7)
Sodium: 137 mEq/L (ref 128–145)
Total Bilirubin: 0.8 mg/dl (ref 0.20–1.60)
Total Protein: 6.5 g/dL (ref 6.4–8.1)

## 2015-09-02 LAB — CBC WITH DIFFERENTIAL (CANCER CENTER ONLY)
BASO#: 0 10*3/uL (ref 0.0–0.2)
BASO%: 0.3 % (ref 0.0–2.0)
EOS%: 2.3 % (ref 0.0–7.0)
Eosinophils Absolute: 0.2 10*3/uL (ref 0.0–0.5)
HCT: 35.4 % — ABNORMAL LOW (ref 38.7–49.9)
HGB: 11.7 g/dL — ABNORMAL LOW (ref 13.0–17.1)
LYMPH#: 0.9 10*3/uL (ref 0.9–3.3)
LYMPH%: 14.7 % (ref 14.0–48.0)
MCH: 32.7 pg (ref 28.0–33.4)
MCHC: 33.1 g/dL (ref 32.0–35.9)
MCV: 99 fL — ABNORMAL HIGH (ref 82–98)
MONO#: 0.6 10*3/uL (ref 0.1–0.9)
MONO%: 8.6 % (ref 0.0–13.0)
NEUT#: 4.8 10*3/uL (ref 1.5–6.5)
NEUT%: 74.1 % (ref 40.0–80.0)
Platelets: 258 10*3/uL (ref 145–400)
RBC: 3.58 10*6/uL — ABNORMAL LOW (ref 4.20–5.70)
RDW: 13.9 % (ref 11.1–15.7)
WBC: 6.4 10*3/uL (ref 4.0–10.0)

## 2015-09-02 MED ORDER — LEUPROLIDE ACETATE (3 MONTH) 22.5 MG IM KIT
22.5000 mg | PACK | Freq: Once | INTRAMUSCULAR | Status: AC
  Administered 2015-09-02: 22.5 mg via INTRAMUSCULAR
  Filled 2015-09-02: qty 22.5

## 2015-09-02 MED ORDER — DENOSUMAB 120 MG/1.7ML ~~LOC~~ SOLN
120.0000 mg | Freq: Once | SUBCUTANEOUS | Status: AC
  Administered 2015-09-02: 120 mg via SUBCUTANEOUS
  Filled 2015-09-02: qty 1.7

## 2015-09-02 MED FILL — GLIMEPIRIDE 1 MG TABLET: 1 | 90 days supply | Qty: 180 | Fill #0

## 2015-09-02 NOTE — Patient Instructions (Signed)
Denosumab injection What is this medicine? DENOSUMAB (den oh sue mab) slows bone breakdown. Prolia is used to treat osteoporosis in women after menopause and in men. Xgeva is used to prevent bone fractures and other bone problems caused by cancer bone metastases. Xgeva is also used to treat giant cell tumor of the bone. This medicine may be used for other purposes; ask your health care provider or pharmacist if you have questions. What should I tell my health care provider before I take this medicine? They need to know if you have any of these conditions: -dental disease -eczema -infection or history of infections -kidney disease or on dialysis -low blood calcium or vitamin D -malabsorption syndrome -scheduled to have surgery or tooth extraction -taking medicine that contains denosumab -thyroid or parathyroid disease -an unusual reaction to denosumab, other medicines, foods, dyes, or preservatives -pregnant or trying to get pregnant -breast-feeding How should I use this medicine? This medicine is for injection under the skin. It is given by a health care professional in a hospital or clinic setting. If you are getting Prolia, a special MedGuide will be given to you by the pharmacist with each prescription and refill. Be sure to read this information carefully each time. For Prolia, talk to your pediatrician regarding the use of this medicine in children. Special care may be needed. For Xgeva, talk to your pediatrician regarding the use of this medicine in children. While this drug may be prescribed for children as young as 13 years for selected conditions, precautions do apply. Overdosage: If you think you have taken too much of this medicine contact a poison control center or emergency room at once. NOTE: This medicine is only for you. Do not share this medicine with others. What if I miss a dose? It is important not to miss your dose. Call your doctor or health care professional if you are  unable to keep an appointment. What may interact with this medicine? Do not take this medicine with any of the following medications: -other medicines containing denosumab This medicine may also interact with the following medications: -medicines that suppress the immune system -medicines that treat cancer -steroid medicines like prednisone or cortisone This list may not describe all possible interactions. Give your health care provider a list of all the medicines, herbs, non-prescription drugs, or dietary supplements you use. Also tell them if you smoke, drink alcohol, or use illegal drugs. Some items may interact with your medicine. What should I watch for while using this medicine? Visit your doctor or health care professional for regular checks on your progress. Your doctor or health care professional may order blood tests and other tests to see how you are doing. Call your doctor or health care professional if you get a cold or other infection while receiving this medicine. Do not treat yourself. This medicine may decrease your body's ability to fight infection. You should make sure you get enough calcium and vitamin D while you are taking this medicine, unless your doctor tells you not to. Discuss the foods you eat and the vitamins you take with your health care professional. See your dentist regularly. Brush and floss your teeth as directed. Before you have any dental work done, tell your dentist you are receiving this medicine. Do not become pregnant while taking this medicine or for 5 months after stopping it. Women should inform their doctor if they wish to become pregnant or think they might be pregnant. There is a potential for serious side effects   to an unborn child. Talk to your health care professional or pharmacist for more information. What side effects may I notice from receiving this medicine? Side effects that you should report to your doctor or health care professional as soon as  possible: -allergic reactions like skin rash, itching or hives, swelling of the face, lips, or tongue -breathing problems -chest pain -fast, irregular heartbeat -feeling faint or lightheaded, falls -fever, chills, or any other sign of infection -muscle spasms, tightening, or twitches -numbness or tingling -skin blisters or bumps, or is dry, peels, or red -slow healing or unexplained pain in the mouth or jaw -unusual bleeding or bruising Side effects that usually do not require medical attention (Report these to your doctor or health care professional if they continue or are bothersome.): -muscle pain -stomach upset, gas This list may not describe all possible side effects. Call your doctor for medical advice about side effects. You may report side effects to FDA at 1-800-FDA-1088. Where should I keep my medicine? This medicine is only given in a clinic, doctor's office, or other health care setting and will not be stored at home. NOTE: This sheet is a summary. It may not cover all possible information. If you have questions about this medicine, talk to your doctor, pharmacist, or health care provider.    2016, Elsevier/Gold Standard. (2011-07-02 12:37:47) Leuprolide injection What is this medicine? LEUPROLIDE (loo PROE lide) is a man-made hormone. It is used to treat the symptoms of prostate cancer. This medicine may also be used to treat children with early onset of puberty. It may be used for other hormonal conditions. This medicine may be used for other purposes; ask your health care provider or pharmacist if you have questions. What should I tell my health care provider before I take this medicine? They need to know if you have any of these conditions: -diabetes -heart disease or previous heart attack -high blood pressure -high cholesterol -pain or difficulty passing urine -spinal cord metastasis -stroke -tobacco smoker -an unusual or allergic reaction to leuprolide, benzyl  alcohol, other medicines, foods, dyes, or preservatives -pregnant or trying to get pregnant -breast-feeding How should I use this medicine? This medicine is for injection under the skin or into a muscle. You will be taught how to prepare and give this medicine. Use exactly as directed. Take your medicine at regular intervals. Do not take your medicine more often than directed. It is important that you put your used needles and syringes in a special sharps container. Do not put them in a trash can. If you do not have a sharps container, call your pharmacist or healthcare provider to get one. Talk to your pediatrician regarding the use of this medicine in children. While this medicine may be prescribed for children as young as 8 years for selected conditions, precautions do apply. Overdosage: If you think you have taken too much of this medicine contact a poison control center or emergency room at once. NOTE: This medicine is only for you. Do not share this medicine with others. What if I miss a dose? If you miss a dose, take it as soon as you can. If it is almost time for your next dose, take only that dose. Do not take double or extra doses. What may interact with this medicine? Do not take this medicine with any of the following medications: -chasteberry This medicine may also interact with the following medications: -herbal or dietary supplements, like black cohosh or DHEA -male hormones, like estrogens  or progestins and birth control pills, patches, rings, or injections -male hormones, like testosterone This list may not describe all possible interactions. Give your health care provider a list of all the medicines, herbs, non-prescription drugs, or dietary supplements you use. Also tell them if you smoke, drink alcohol, or use illegal drugs. Some items may interact with your medicine. What should I watch for while using this medicine? Visit your doctor or health care professional for regular  checks on your progress. During the first week, your symptoms may get worse, but then will improve as you continue your treatment. You may get hot flashes, increased bone pain, increased difficulty passing urine, or an aggravation of nerve symptoms. Discuss these effects with your doctor or health care professional, some of them may improve with continued use of this medicine. Male patients may experience a menstrual cycle or spotting during the first 2 months of therapy with this medicine. If this continues, contact your doctor or health care professional. What side effects may I notice from receiving this medicine? Side effects that you should report to your doctor or health care professional as soon as possible: -allergic reactions like skin rash, itching or hives, swelling of the face, lips, or tongue -breathing problems -chest pain -depression or memory disorders -pain in your legs or groin -pain at site where injected -severe headache -swelling of the feet and legs -visual changes -vomiting Side effects that usually do not require medical attention (report to your doctor or health care professional if they continue or are bothersome): -breast swelling or tenderness -decrease in sex drive or performance -diarrhea -hot flashes -loss of appetite -muscle, joint, or bone pains -nausea -redness or irritation at site where injected -skin problems or acne This list may not describe all possible side effects. Call your doctor for medical advice about side effects. You may report side effects to FDA at 1-800-FDA-1088. Where should I keep my medicine? Keep out of the reach of children. Store below 25 degrees C (77 degrees F). Do not freeze. Protect from light. Do not use if it is not clear or if there are particles present. Throw away any unused medicine after the expiration date. NOTE: This sheet is a summary. It may not cover all possible information. If you have questions about this  medicine, talk to your doctor, pharmacist, or health care provider.    2016, Elsevier/Gold Standard. (2008-05-18 13:26:20)

## 2015-09-03 LAB — PSA: Prostate Specific Ag, Serum: <0.1 ng/mL (ref 0.0–4.0)

## 2015-09-05 ENCOUNTER — Encounter: Payer: Self-pay | Admitting: *Deleted

## 2015-09-06 NOTE — Progress Notes (Signed)
Hematology and Oncology Follow Up Visit  [name] [id number] 24-Jul-1945 70 y.o. 09/06/2015   Principle Diagnosis:   Metastatic castrate resistant prostate cancer  Current Therapy:    Zytiga 1000 mg by mouth daily  Xgeva 120 mg subcutaneous Q3 months - due in  Sept  Lupron 22 mg IM every 3 months - due in Sept  Radium-223 therapy -- start in 1-2 week     Interim History:  [name] is back for followup. He seems to be doing a little better. He was having some issues with his right hip. He had another bone scan done. This showed increased activity in the right sacroiliac region. He was seen by radiation oncology. They ordered an MRI area and the MRI showed that there was disease in the right sacrum. There is enhancing mass measuring 5.2 x 3.2 x 4.2 cm. This extends into the epidural space on the right just inferior to the S1 foramen. There is some impingement of the right S2 root. A another lesion was noted in the left ilium measuring 1 cm. No adenopathy was noted.  I looks like they will try some radium-223 therapy on him.  His PSA still is less than 0.1.  He is still working part-time. He is not complaining of any pain in the pelvis. Is no change in bowel or bladder habits. He's had no weakness in the legs. He has some discomfort in the right lateral leg.  His blood sugars have been doing fairly well.  She's had no cardiac issues. He's had no chest pain. He's had no dizziness.  His appetite has been doing alright.  He seems be doing well with the Zytiga.  His performance status is ECOG 1.   Medications:  Current Outpatient Prescriptions:  .  aspirin 81 MG tablet, Take 81 mg by mouth daily., Disp: , Rfl:  .  Calcium Carbonate-Vitamin D (CALCIUM + D PO), Take 500 mg by mouth 2 (two) times daily., Disp: , Rfl:  .  gabapentin (NEURONTIN) 100 MG capsule, TAKE 1 CAPSULE BY MOUTH EVERY 8 HOURS, Disp: 90 capsule, Rfl: 6 .  glimepiride (AMARYL) 1 MG tablet, Take 2 tablets (2  mg total) by mouth daily with breakfast., Disp: 180 tablet, Rfl: 2 .  ipratropium (ATROVENT) 0.06 % nasal spray, Place 1 spray into both nostrils 4 (four) times daily., Disp: , Rfl:  .  isosorbide mononitrate (IMDUR) 30 MG 24 hr tablet, Take 30 mg by mouth daily., Disp: , Rfl:  .  metFORMIN (GLUCOPHAGE-XR) 500 MG 24 hr tablet, Take 2 tablets (1,000 mg total) by mouth daily with breakfast., Disp: 180 tablet, Rfl: 2 .  Multiple Minerals-Vitamins (CALCIUM & VIT D3 BONE HEALTH PO), Take by mouth every morning. , Disp: , Rfl:  .  OVER THE COUNTER MEDICATION, every morning. Juice Plus, Disp: , Rfl:  .  predniSONE (DELTASONE) 5 MG tablet, TAKE 1 TABLET BY MOUTH DAILY, Disp: 60 tablet, Rfl: 3 .  ZETIA 10 MG tablet, 10 mg daily., Disp: , Rfl:  .  ZYTIGA 250 MG tablet, TAKE 4 TABLETS BY MOUTH DAILY. TAKE ON AN EMPTY STOMACH 1 HOUR BEFORE OR 2 HOURS AFTER A MEAL, Disp: 120 tablet, Rfl: 3  Allergies:  Allergies  Allergen Reactions  . Codeine Nausea And Vomiting  . Hydrocodone Nausea And Vomiting    Past Medical History, Surgical history, Social history, and Family History were reviewed and updated.  Review of Systems: As above  Physical Exam:  height is '5\' 9"'$  (  1.753 m) and weight is 159 lb (72.1 kg). His oral temperature is 98.1 F (36.7 C). His blood pressure is 106/60 and his pulse is 87. His respiration is 16.   Well-developed and well-nourished white male. Head and neck exam shows no ocular or oral lesions. The are no palpable cervical or supraclavicular lymph nodes. Lungs are clear with no rales, wheezes or rhonchi. Cardiac exam regular rate and rhythm with no murmurs rubs or bruits. Abdomen is soft. He has good bowel sounds. There is no fluid wave. There is no palpable liver or spleen tip. Back exam shows no tenderness over the spine ribs or hips. I cannot elicit any tenderness over the sacrum bilaterally. He has good range of motion of his pelvis. Extremities shows no clubbing, cyanosis or  edema. Skin exam shows areas of urticaria on the left forearm.. There is also an area as solitary in the upper chest wall in the upper sternal region. These are nontender. There are erythematous with a central area of vesicle. No erythema to the skin is noted. Neurological exam is nonfocal.   Lab Results  Component Value Date   WBC 6.4 09/02/2015   HGB 11.7 (L) 09/02/2015   HCT 35.4 (L) 09/02/2015   MCV 99 (H) 09/02/2015   PLT 258 09/02/2015     Chemistry      Component Value Date/Time   NA 137 09/02/2015 1038   NA 141 07/22/2015 0930   K 3.7 09/02/2015 1038   K 3.9 07/22/2015 0930   CL 102 09/02/2015 1038   CO2 28 09/02/2015 1038   CO2 28 07/22/2015 0930   BUN 16 09/02/2015 1038   BUN 20.9 07/22/2015 0930   CREATININE 1.4 (H) 09/02/2015 1038   CREATININE 1.3 07/22/2015 0930      Component Value Date/Time   CALCIUM 9.7 09/02/2015 1038   CALCIUM 9.8 07/22/2015 0930   ALKPHOS 65 09/02/2015 1038   ALKPHOS 76 07/22/2015 0930   AST 19 09/02/2015 1038   AST 16 07/22/2015 0930   ALT 13 09/02/2015 1038   ALT 12 07/22/2015 0930   BILITOT 0.80 09/02/2015 1038   BILITOT 0.36 07/22/2015 0930         Impression and Plan: [name] is 70 year old gentleman with metastatic prostate cancer.   The MRI certainly seemed to show more disease than what is noted on the bone scan. I suppose that the changes on the MRI may voice been there.  I think that the radium-223 therapy would not be a bad idea. I also suspect that external beam radiation could also be tried to that right sacral area.  I don't think we have to change the Zytiga. I don't think he needs any, systemic chemotherapy right now.  I spent about 40 minutes with he and his wife. I was trying to explain the MRI findings and what the options are.  When we see him back, he'll get his Lupron and Xgeva.  As always, we will plan to get him back in another 6 weeks.    [name], MD 8/22/20177:30 AM

## 2015-09-07 ENCOUNTER — Encounter: Payer: Self-pay | Admitting: *Deleted

## 2015-09-13 ENCOUNTER — Other Ambulatory Visit: Payer: Self-pay | Admitting: Radiation Oncology

## 2015-09-13 ENCOUNTER — Ambulatory Visit: Payer: Medicare Other

## 2015-09-13 ENCOUNTER — Telehealth: Payer: Self-pay | Admitting: *Deleted

## 2015-09-13 DIAGNOSIS — C61 Malignant neoplasm of prostate: Secondary | ICD-10-CM

## 2015-09-13 DIAGNOSIS — C7951 Secondary malignant neoplasm of bone: Principal | ICD-10-CM

## 2015-09-13 NOTE — Telephone Encounter (Signed)
CALLED PATIENT TO INFORM OF LAB AND WEIGHT ON 09-15-15 @ 12 AND HIS XOFIGO ON 09-22-15 @ 12:30 PM @ [facility], SPOKE WITH PATIENT AND HE IS AWARE OF THESE APPTS.

## 2015-09-15 ENCOUNTER — Ambulatory Visit
Admission: RE | Admit: 2015-09-15 | Discharge: 2015-09-15 | Disposition: A | Discharge status: Home / Self Care | Payer: Medicare Other | Source: Ambulatory Visit | Attending: Radiation Oncology | Admitting: Radiation Oncology

## 2015-09-15 ENCOUNTER — Other Ambulatory Visit: Payer: Self-pay | Admitting: Oncology

## 2015-09-15 ENCOUNTER — Other Ambulatory Visit: Payer: Self-pay | Admitting: Hematology & Oncology

## 2015-09-15 DIAGNOSIS — C7951 Secondary malignant neoplasm of bone: Secondary | ICD-10-CM

## 2015-09-15 LAB — CBC WITH DIFFERENTIAL/PLATELET
BASO%: 0.1 % (ref 0.0–2.0)
Basophils Absolute: 0 10*3/uL (ref 0.0–0.1)
EOS%: 0.5 % (ref 0.0–7.0)
Eosinophils Absolute: 0 10*3/uL (ref 0.0–0.5)
HCT: 35 % — ABNORMAL LOW (ref 38.4–49.9)
HGB: 11.6 g/dL — ABNORMAL LOW (ref 13.0–17.1)
LYMPH%: 10.6 % — ABNORMAL LOW (ref 14.0–49.0)
MCH: 32 pg (ref 27.2–33.4)
MCHC: 33.1 g/dL (ref 32.0–36.0)
MCV: 96.4 fL (ref 79.3–98.0)
MONO#: 0.5 10*3/uL (ref 0.1–0.9)
MONO%: 6.4 % (ref 0.0–14.0)
NEUT#: 6.4 10*3/uL (ref 1.5–6.5)
NEUT%: 82.4 % — ABNORMAL HIGH (ref 39.0–75.0)
Platelets: 262 10*3/uL (ref 140–400)
RBC: 3.63 10*6/uL — ABNORMAL LOW (ref 4.20–5.82)
RDW: 13.5 % (ref 11.0–14.6)
WBC: 7.8 10*3/uL (ref 4.0–10.3)
lymph#: 0.8 10*3/uL — ABNORMAL LOW (ref 0.9–3.3)
nRBC: 0 % (ref 0–0)

## 2015-09-15 MED FILL — GABAPENTIN 100 MG CAPSULE: 100 | 30 days supply | Qty: 90 | Fill #0

## 2015-09-15 MED FILL — predniSONE 5 MG TABS: 5 | 60 days supply | Qty: 60 | Fill #3

## 2015-09-15 MED FILL — ZYTIGA 250 MG TABLET: 250 | 30 days supply | Qty: 120 | Fill #0

## 2015-09-20 MED FILL — METFORMIN HCL ER 500 MG TAB: 500 | 30 days supply | Qty: 60 | Fill #4

## 2015-09-21 ENCOUNTER — Telehealth: Payer: Self-pay | Admitting: *Deleted

## 2015-09-21 NOTE — Telephone Encounter (Signed)
CALLED PATIENT TO REMIND OF XOFIGO INJ. FOR 09-22-15, SPOKE WITH PATIENT AND HE IS AWARE OF THIS INJ.

## 2015-09-22 ENCOUNTER — Encounter (HOSPITAL_COMMUNITY)
Admission: RE | Admit: 2015-09-22 | Discharge: 2015-09-22 | Disposition: A | Discharge status: Home / Self Care | Payer: Medicare Other | Source: Ambulatory Visit | Attending: Radiation Oncology | Admitting: Radiation Oncology

## 2015-09-22 ENCOUNTER — Other Ambulatory Visit: Payer: Self-pay | Admitting: Radiation Oncology

## 2015-09-22 DIAGNOSIS — C7951 Secondary malignant neoplasm of bone: Secondary | ICD-10-CM | POA: Diagnosis not present

## 2015-09-22 DIAGNOSIS — C61 Malignant neoplasm of prostate: Secondary | ICD-10-CM | POA: Diagnosis not present

## 2015-09-22 MED ORDER — PROCHLORPERAZINE MALEATE 10 MG PO TABS: 10.0000 mg | ORAL_TABLET | Freq: Four times a day (QID) | ORAL | 0 refills | Status: DC | PRN

## 2015-09-22 MED FILL — PROCHLORPERAZINE 10 MG TAB: 10 | 7 days supply | Qty: 30 | Fill #0

## 2015-09-22 NOTE — Progress Notes (Signed)
  Radiation Oncology         [phone] ________________________________  Name: [name] MRN: [id number]  Date: 09/22/2015  DOB: 1945/07/26  Radium-223 Infusion Note  Diagnosis:  Castration resistant prostate cancer with painful bone involvement  Current Infusion:    1  Planned Infusions:  6  Narrative: [name] presented to nuclear medicine for treatment. His most recent blood counts were reviewed.  He remains a good candidate to proceed with Ra-223.  The patient was situated in an infusion suite with a contact barrier placed under his arm. Intravenous access was established, using sterile technique, and a normal saline infusion from a syringe was started.  Micro-dosimetry:  The prescribed radiation activity was assayed and confirmed to be within specified tolerance.  Special Treatment Procedure - Infusion:  The nuclear medicine technologist and I personally verified the dose activity to be delivered as specified in the written directive, and verified the patient identification via 2 separate methods.  The syringe containing the dose was attached to a 3 way stopcock, and then the valve was opened to the patient, and the dose delivered over a minute. No complications were noted.  The total administered dose was 106.5 microcuries in a volume of 6.4cc.  A saline flush of the line and the syringe that contained the isotope was then performed.  The residual radioactivity in the syringe was 2.65 microcuries, so the actual infused isotope activity was 103.85 microcuries.   Pressure was applied to the venipuncture site, and a compression bandage placed.   Radiation Safety personnel were present to perform the discharge survey, as detailed on their documentation.   After a short period of observation, the patient had his IV removed.  Impression:  The patient tolerated his infusion relatively well.  Plan:  The patient will return in one month for ongoing care.      -----------------------------------  Blair Promise, PhD, MD  This document serves as a record of services personally performed by [name], MD. It was created on his behalf by Darcus Austin, a trained medical scribe. The creation of this record is based on the scribe's personal observations and the provider's statements to them. This document has been checked and approved by the attending provider.

## 2015-09-23 ENCOUNTER — Other Ambulatory Visit: Payer: Self-pay | Admitting: Radiation Oncology

## 2015-09-23 ENCOUNTER — Telehealth: Payer: Self-pay | Admitting: *Deleted

## 2015-09-23 DIAGNOSIS — C7951 Secondary malignant neoplasm of bone: Principal | ICD-10-CM

## 2015-09-23 DIAGNOSIS — C61 Malignant neoplasm of prostate: Secondary | ICD-10-CM

## 2015-09-23 NOTE — Telephone Encounter (Signed)
CALLED PATIENT TO INFORM OF LAB AND WEIGHT FOR 10-20-15 @ 12 PM AND HIS XOFIGO INJ. ON 10-27-15 @ 12 PM @ [facility], SPOKE WITH PATIENT AND HE IS AWARE OF THESE APPTS.

## 2015-09-30 DIAGNOSIS — L570 Actinic keratosis: Secondary | ICD-10-CM | POA: Diagnosis not present

## 2015-10-14 ENCOUNTER — Other Ambulatory Visit (HOSPITAL_BASED_OUTPATIENT_CLINIC_OR_DEPARTMENT_OTHER): Payer: Medicare Other

## 2015-10-14 ENCOUNTER — Ambulatory Visit (HOSPITAL_BASED_OUTPATIENT_CLINIC_OR_DEPARTMENT_OTHER): Payer: Medicare Other

## 2015-10-14 ENCOUNTER — Other Ambulatory Visit: Payer: Self-pay | Admitting: *Deleted

## 2015-10-14 ENCOUNTER — Ambulatory Visit (HOSPITAL_BASED_OUTPATIENT_CLINIC_OR_DEPARTMENT_OTHER): Payer: Medicare Other | Admitting: Hematology & Oncology

## 2015-10-14 ENCOUNTER — Encounter: Payer: Self-pay | Admitting: Hematology & Oncology

## 2015-10-14 VITALS — BP 152/80 | HR 88 | Temp 97.5°F | Resp 18 | Ht 69.0 in | Wt 157.8 lb

## 2015-10-14 DIAGNOSIS — M79651 Pain in right thigh: Secondary | ICD-10-CM

## 2015-10-14 DIAGNOSIS — C61 Malignant neoplasm of prostate: Secondary | ICD-10-CM

## 2015-10-14 DIAGNOSIS — C7951 Secondary malignant neoplasm of bone: Secondary | ICD-10-CM | POA: Diagnosis not present

## 2015-10-14 DIAGNOSIS — G620 Drug-induced polyneuropathy: Secondary | ICD-10-CM

## 2015-10-14 DIAGNOSIS — G63 Polyneuropathy in diseases classified elsewhere: Principal | ICD-10-CM

## 2015-10-14 DIAGNOSIS — C801 Malignant (primary) neoplasm, unspecified: Secondary | ICD-10-CM

## 2015-10-14 LAB — CBC WITH DIFFERENTIAL (CANCER CENTER ONLY)
BASO#: 0 10*3/uL (ref 0.0–0.2)
BASO%: 0.5 % (ref 0.0–2.0)
EOS%: 3.4 % (ref 0.0–7.0)
Eosinophils Absolute: 0.2 10*3/uL (ref 0.0–0.5)
HCT: 38.6 % — ABNORMAL LOW (ref 38.7–49.9)
HGB: 12.8 g/dL — ABNORMAL LOW (ref 13.0–17.1)
LYMPH#: 1.3 10*3/uL (ref 0.9–3.3)
LYMPH%: 21.2 % (ref 14.0–48.0)
MCH: 32.2 pg (ref 28.0–33.4)
MCHC: 33.2 g/dL (ref 32.0–35.9)
MCV: 97 fL (ref 82–98)
MONO#: 0.6 10*3/uL (ref 0.1–0.9)
MONO%: 10.2 % (ref 0.0–13.0)
NEUT#: 4.1 10*3/uL (ref 1.5–6.5)
NEUT%: 64.7 % (ref 40.0–80.0)
Platelets: 276 10*3/uL (ref 145–400)
RBC: 3.97 10*6/uL — ABNORMAL LOW (ref 4.20–5.70)
RDW: 12.8 % (ref 11.1–15.7)
WBC: 6.3 10*3/uL (ref 4.0–10.0)

## 2015-10-14 LAB — COMPREHENSIVE METABOLIC PANEL
ALT: 12 U/L (ref 0–55)
AST: 17 U/L (ref 5–34)
Albumin: 3.8 g/dL (ref 3.5–5.0)
Alkaline Phosphatase: 78 U/L (ref 40–150)
Anion Gap: 11 mEq/L (ref 3–11)
BUN: 17.9 mg/dL (ref 7.0–26.0)
CO2: 27 mEq/L (ref 22–29)
Calcium: 10 mg/dL (ref 8.4–10.4)
Chloride: 103 mEq/L (ref 98–109)
Creatinine: 1.2 mg/dL (ref 0.7–1.3)
EGFR: 60 mL/min/{1.73_m2} — ABNORMAL LOW (ref >90)
Glucose: 145 mg/dl — ABNORMAL HIGH (ref 70–140)
Potassium: 4.2 mEq/L (ref 3.5–5.1)
Sodium: 141 mEq/L (ref 136–145)
Total Bilirubin: 0.45 mg/dL (ref 0.20–1.20)
Total Protein: 7.4 g/dL (ref 6.4–8.3)

## 2015-10-14 MED ORDER — LEUPROLIDE ACETATE (3 MONTH) 22.5 MG IM KIT: 22.5000 mg | PACK | Freq: Once | INTRAMUSCULAR | Status: DC

## 2015-10-14 MED ORDER — GABAPENTIN 300 MG PO CAPS: ORAL_CAPSULE | ORAL | 2 refills | Status: DC

## 2015-10-14 MED ORDER — GABAPENTIN 300 MG PO CAPS: ORAL_CAPSULE | ORAL | 4 refills | Status: DC

## 2015-10-14 MED ORDER — TRAMADOL HCL 50 MG PO TABS: 50.0000 mg | ORAL_TABLET | Freq: Four times a day (QID) | ORAL | 2 refills | Status: DC | PRN

## 2015-10-14 MED ORDER — SODIUM CHLORIDE 0.9 % IV SOLN
20.0000 mg | Freq: Once | INTRAVENOUS | Status: AC
  Administered 2015-10-14: 20 mg via INTRAVENOUS
  Filled 2015-10-14: qty 2

## 2015-10-14 MED FILL — GABAPENTIN 300 MG CAPSULE: 300 | 30 days supply | Qty: 120 | Fill #0

## 2015-10-14 MED FILL — ZYTIGA 250 MG TABLET: 250 | 30 days supply | Qty: 120 | Fill #1

## 2015-10-14 MED FILL — ISOSORBIDE MN ER 30 MG TAB: 30 | 90 days supply | Qty: 90 | Fill #1

## 2015-10-14 MED FILL — METFORMIN HCL ER 500 MG TAB: 500 | 90 days supply | Qty: 180 | Fill #0

## 2015-10-14 NOTE — Progress Notes (Signed)
Hematology and Oncology Follow Up Visit  [name] [id number] [birth year] 70 y.o. 10/14/2015   Principle Diagnosis:   Metastatic castrate resistant prostate cancer  Current Therapy:    Zytiga 1000 mg by mouth daily  Xgeva 120 mg subcutaneous Q3 months - due in  Sept  Lupron 22 mg IM every 3 months - due in Sept  Radium-223 therapy -- s/p cycle #1     Interim History:  [name] is back for followup. He is having some problems with the posterior right thigh. This is where the pain has been. He has had issues with this area. When he had his last MRI, he was found have a right sacral lesion. He had think he had other areas. Surprisingly, these lesions never showed up on the bone scan.  He has started the Lasker about 3 weeks ago. He has had 1 dose. He tolerated this well.  The anus in the posterior right thigh seems to be worse when he sits or lies down. He has to walk.  He is on low-dose Neurontin. He's been on 100 mg dose. He's not been taking this faithfully. I think that we should try to get the dose up to 300 mg twice a day and 600 mg at bedtime.  I think that we might have to consider some powdered radiation to the right sacral area.  His last PSA was less than 0.1. This has was been the case.  He is done pretty well taken the Zytiga. He is on hypoglycemic agents.  The pain was exacerbated today. We will see if Dr.Kinard can help and positive give a fraction of radiation to the right sacral area.  His blood sugars have been doing fairly well.  She's had no cardiac issues. He's had no chest pain. He's had no dizziness.  His appetite has been doing alright.  He seems be doing well with the Zytiga.  His performance status is ECOG 1.   Medications:  Current Outpatient Prescriptions:  .  aspirin 81 MG tablet, Take 81 mg by mouth daily., Disp: , Rfl:  .  Calcium Carbonate-Vitamin D (CALCIUM + D PO), Take 500 mg by mouth 2 (two) times daily., Disp: , Rfl:  .   gabapentin (NEURONTIN) 300 MG capsule, Take 1 pill twice a day, then take 2 at bedtime, Disp: 120 capsule, Rfl: 4 .  glimepiride (AMARYL) 1 MG tablet, Take 2 tablets (2 mg total) by mouth daily with breakfast., Disp: 180 tablet, Rfl: 2 .  ipratropium (ATROVENT) 0.06 % nasal spray, Place 1 spray into both nostrils 4 (four) times daily., Disp: , Rfl:  .  isosorbide mononitrate (IMDUR) 30 MG 24 hr tablet, Take 30 mg by mouth daily., Disp: , Rfl:  .  metFORMIN (GLUCOPHAGE-XR) 500 MG 24 hr tablet, Take 2 tablets (1,000 mg total) by mouth daily with breakfast., Disp: 180 tablet, Rfl: 2 .  Multiple Minerals-Vitamins (CALCIUM & VIT D3 BONE HEALTH PO), Take by mouth every morning. , Disp: , Rfl:  .  OVER THE COUNTER MEDICATION, every morning. Juice Plus, Disp: , Rfl:  .  predniSONE (DELTASONE) 5 MG tablet, TAKE 1 TABLET BY MOUTH DAILY, Disp: 60 tablet, Rfl: 3 .  prochlorperazine (COMPAZINE) 10 MG tablet, Take 1 tablet (10 mg total) by mouth every 6 (six) hours as needed for nausea or vomiting., Disp: 30 tablet, Rfl: 0 .  ZETIA 10 MG tablet, 10 mg daily., Disp: , Rfl:  .  ZYTIGA 250 MG tablet, TAKE 4 TABLETS  BY MOUTH DAILY. TAKE ON AN EMPTY STOMACH 1 HOUR BEFORE OR 2 HOURS AFTER A MEAL, Disp: 120 tablet, Rfl: 3 .  gabapentin (NEURONTIN) 300 MG capsule, Take 1 pill 2 times a day, then take 2 pills at bedtime., Disp: 120 capsule, Rfl: 2  Allergies:  Allergies  Allergen Reactions  . Codeine Nausea And Vomiting  . Hydrocodone Nausea And Vomiting    Past Medical History, Surgical history, Social history, and Family History were reviewed and updated.  Review of Systems: As above  Physical Exam:  height is '5\' 9"'$  (1.753 m) and weight is 157 lb 12.8 oz (71.6 kg). His temperature is 97.5 F (36.4 C). His blood pressure is 152/80 (abnormal) and his pulse is 88. His respiration is 18.   Well-developed and well-nourished white male. Head and neck exam shows no ocular or oral lesions. The are no palpable  cervical or supraclavicular lymph nodes. Lungs are clear with no rales, wheezes or rhonchi. Cardiac exam regular rate and rhythm with no murmurs rubs or bruits. Abdomen is soft. He has good bowel sounds. There is no fluid wave. There is no palpable liver or spleen tip. Back exam shows no tenderness over the spine ribs or hips. I cannot elicit any tenderness over the sacrum bilaterally. He has good range of motion of his pelvis. Extremities shows no clubbing, cyanosis or edema. Skin exam shows areas of urticaria on the left forearm.. There is also an area as solitary in the upper chest wall in the upper sternal region. These are nontender. There are erythematous with a central area of vesicle. No erythema to the skin is noted. Neurological exam is nonfocal.   Lab Results  Component Value Date   WBC 6.3 10/14/2015   HGB 12.8 (L) 10/14/2015   HCT 38.6 (L) 10/14/2015   MCV 97 10/14/2015   PLT 276 10/14/2015     Chemistry      Component Value Date/Time   NA 137 09/02/2015 1038   NA 141 07/22/2015 0930   K 3.7 09/02/2015 1038   K 3.9 07/22/2015 0930   CL 102 09/02/2015 1038   CO2 28 09/02/2015 1038   CO2 28 07/22/2015 0930   BUN 16 09/02/2015 1038   BUN 20.9 07/22/2015 0930   CREATININE 1.4 (H) 09/02/2015 1038   CREATININE 1.3 07/22/2015 0930      Component Value Date/Time   CALCIUM 9.7 09/02/2015 1038   CALCIUM 9.8 07/22/2015 0930   ALKPHOS 65 09/02/2015 1038   ALKPHOS 76 07/22/2015 0930   AST 19 09/02/2015 1038   AST 16 07/22/2015 0930   ALT 13 09/02/2015 1038   ALT 12 07/22/2015 0930   BILITOT 0.80 09/02/2015 1038   BILITOT 0.36 07/22/2015 0930         Impression and Plan: [name] is 70 year old gentleman with metastatic prostate cancer.   I have just worried that this pain that is having in the right posterior thigh is from the tumor. He has had on one cycle of Xofigo and I told he and his wife that it may take 3 or 4 cycles before he finally get relief.   Again, I  think that some powdered radiation to the right sacral area would not be a bad idea. I will speak with Dr. Sondra Come was he returns from vacation. I will let her try to move all this through next week area did   Again, hopefully the increased dose of Neurontin will help.   I will like to  see him back in 3 weeks. I just feel bad and I feel his frustration in not being able to do what he would like to do.    [name], MD 9/29/201710:04 AM

## 2015-10-14 NOTE — Patient Instructions (Signed)
Dexamethasone injection What is this medicine? DEXAMETHASONE (dex a METH a sone) is a corticosteroid. It is used to treat inflammation of the skin, joints, lungs, and other organs. Common conditions treated include asthma, allergies, and arthritis. It is also used for other conditions, like blood disorders and diseases of the adrenal glands. This medicine may be used for other purposes; ask your health care provider or pharmacist if you have questions. What should I tell my health care provider before I take this medicine? They need to know if you have any of these conditions: -blood clotting problems -Cushing's syndrome -diabetes -glaucoma -heart problems or disease -high blood pressure -infection like herpes, measles, tuberculosis, or chickenpox -kidney disease -liver disease -mental problems -myasthenia gravis -osteoporosis -previous heart attack -seizures -stomach, ulcer or intestine disease including colitis and diverticulitis -thyroid problem -an unusual or allergic reaction to dexamethasone, corticosteroids, other medicines, lactose, foods, dyes, or preservatives -pregnant or trying to get pregnant -breast-feeding How should I use this medicine? This medicine is for injection into a muscle, joint, lesion, soft tissue, or vein. It is given by a health care professional in a hospital or clinic setting. Talk to your pediatrician regarding the use of this medicine in children. Special care may be needed. Overdosage: If you think you have taken too much of this medicine contact a poison control center or emergency room at once. NOTE: This medicine is only for you. Do not share this medicine with others. What if I miss a dose? This may not apply. If you are having a series of injections over a prolonged period, try not to miss an appointment. Call your doctor or health care professional to reschedule if you are unable to keep an appointment. What may interact with this medicine? Do  not take this medicine with any of the following medications: -mifepristone, RU-486 -vaccines This medicine may also interact with the following medications: -amphotericin B -antibiotics like clarithromycin, erythromycin, and troleandomycin -aspirin and aspirin-like drugs -barbiturates like phenobarbital -carbamazepine -cholestyramine -cholinesterase inhibitors like donepezil, galantamine, rivastigmine, and tacrine -cyclosporine -digoxin -diuretics -ephedrine -male hormones, like estrogens or progestins and birth control pills -indinavir -isoniazid -ketoconazole -medicines for diabetes -medicines that improve muscle tone or strength for conditions like myasthenia gravis -NSAIDs, medicines for pain and inflammation, like ibuprofen or naproxen -phenytoin -rifampin -thalidomide -warfarin This list may not describe all possible interactions. Give your health care provider a list of all the medicines, herbs, non-prescription drugs, or dietary supplements you use. Also tell them if you smoke, drink alcohol, or use illegal drugs. Some items may interact with your medicine. What should I watch for while using this medicine? Your condition will be monitored carefully while you are receiving this medicine. If you are taking this medicine for a long time, carry an identification card with your name and address, the type and dose of your medicine, and your doctor's name and address. This medicine may increase your risk of getting an infection. Stay away from people who are sick. Tell your doctor or health care professional if you are around anyone with measles or chickenpox. Talk to your health care provider before you get any vaccines that you take this medicine. If you are going to have surgery, tell your doctor or health care professional that you have taken this medicine within the last twelve months. Ask your doctor or health care professional about your diet. You may need to lower the  amount of salt you eat. The medicine can increase your blood sugar. If you  are a diabetic check with your doctor if you need help adjusting the dose of your diabetic medicine. What side effects may I notice from receiving this medicine? Side effects that you should report to your doctor or health care professional as soon as possible: -allergic reactions like skin rash, itching or hives, swelling of the face, lips, or tongue -black or tarry stools -change in the amount of urine -changes in vision -confusion, excitement, restlessness, a false sense of well-being -fever, sore throat, sneezing, cough, or other signs of infection, wounds that will not heal -hallucinations -increased thirst -mental depression, mood swings, mistaken feelings of self importance or of being mistreated -pain in hips, back, ribs, arms, shoulders, or legs -pain, redness, or irritation at the injection site -redness, blistering, peeling or loosening of the skin, including inside the mouth -rounding out of face -swelling of feet or lower legs -unusual bleeding or bruising -unusual tired or weak -wounds that do not heal Side effects that usually do not require medical attention (report to your doctor or health care professional if they continue or are bothersome): -diarrhea or constipation -change in taste -headache -nausea, vomiting -skin problems, acne, thin and shiny skin -touble sleeping -unusual growth of hair on the face or body -weight gain This list may not describe all possible side effects. Call your doctor for medical advice about side effects. You may report side effects to FDA at 1-800-FDA-1088. Where should I keep my medicine? This drug is given in a hospital or clinic and will not be stored at home. NOTE: This sheet is a summary. It may not cover all possible information. If you have questions about this medicine, talk to your doctor, pharmacist, or health care provider.    2016, [employer]. (2007-04-24 14:04:12)

## 2015-10-15 LAB — PSA: Prostate Specific Ag, Serum: <0.1 ng/mL (ref 0.0–4.0)

## 2015-10-16 ENCOUNTER — Emergency Department (HOSPITAL_COMMUNITY)
Admission: EM | Admit: 2015-10-16 | Discharge: 2015-10-17 | Disposition: A | Discharge status: Home / Self Care | Payer: Medicare Other | Attending: Emergency Medicine | Admitting: Emergency Medicine

## 2015-10-16 ENCOUNTER — Encounter (HOSPITAL_COMMUNITY): Payer: Self-pay

## 2015-10-16 DIAGNOSIS — Z8546 Personal history of malignant neoplasm of prostate: Secondary | ICD-10-CM | POA: Insufficient documentation

## 2015-10-16 DIAGNOSIS — Z79899 Other long term (current) drug therapy: Secondary | ICD-10-CM | POA: Insufficient documentation

## 2015-10-16 DIAGNOSIS — M79604 Pain in right leg: Secondary | ICD-10-CM | POA: Diagnosis not present

## 2015-10-16 DIAGNOSIS — M79651 Pain in right thigh: Secondary | ICD-10-CM | POA: Insufficient documentation

## 2015-10-16 DIAGNOSIS — Z7984 Long term (current) use of oral hypoglycemic drugs: Secondary | ICD-10-CM | POA: Insufficient documentation

## 2015-10-16 DIAGNOSIS — Z7982 Long term (current) use of aspirin: Secondary | ICD-10-CM | POA: Diagnosis not present

## 2015-10-16 MED ORDER — ONDANSETRON 4 MG PO TBDP
4.0000 mg | ORAL_TABLET | Freq: Once | ORAL | Status: AC
  Administered 2015-10-16: 4 mg via ORAL
  Filled 2015-10-16: qty 1

## 2015-10-16 MED ORDER — HYDROMORPHONE HCL 2 MG/ML IJ SOLN
2.0000 mg | Freq: Once | INTRAMUSCULAR | Status: AC
  Administered 2015-10-16: 2 mg via INTRAMUSCULAR
  Filled 2015-10-16: qty 1

## 2015-10-16 NOTE — ED Triage Notes (Signed)
Patient c/o right leg pain.  Patient states has prostate cancer that is in his sacrum and he believes that this is causing pressure on the nerves and causing the pain in the leg.  Patient states that he has had the pain on and off for the last few weeks and was given an injection of steroids on Friday which helped briefly.  Patient states that pain is back worse and is unrelieved by medications at home.  Rates pain 10/10

## 2015-10-16 NOTE — ED Notes (Signed)
Pt reports posterior right thigh pain that has been ongoing since 0100 this morning. Pt states he had prior pain earlier this week and was seen by PCP and IV Decadron was given. Pt reported improvement in pain after infusion but pain has since returned and has worsened.

## 2015-10-17 ENCOUNTER — Telehealth: Payer: Self-pay | Admitting: *Deleted

## 2015-10-17 ENCOUNTER — Encounter: Payer: Self-pay | Admitting: *Deleted

## 2015-10-17 MED ORDER — HYDROMORPHONE HCL 4 MG PO TABS: 4.0000 mg | ORAL_TABLET | Freq: Four times a day (QID) | ORAL | 0 refills | Status: DC | PRN

## 2015-10-17 MED ORDER — ONDANSETRON 4 MG PO TBDP: 4.0000 mg | ORAL_TABLET | Freq: Three times a day (TID) | ORAL | 2 refills | Status: DC | PRN

## 2015-10-17 NOTE — Telephone Encounter (Signed)
Patient's wife states patient experienced a high level of pain on Sunday that wasn't controlled with his medications. He was in severe pain and his BP was elevated prompting his wife to take him to the ED. There he received zofran and dilaudid. They returned hom early this am and the patient has been sleeping. He did suffer one fall at home while ambulating to the BR. Wife feels like this is a direct result of him being sedated with medication.  Spoke to Dr Marin Olp. He has spoken to Dr Sondra Come today about starting the patient on radiation therapy to help patient's pain level. They should be contacting the patient this week to set up treatment.  Wife will let patient rest today. In addition to physical pain, his childhood friend was found dead yesterday and this was difficult news for him to hear. Patient's wife will call the office back tomorrow if he continues to be lethargic ans sedated.   Instructed wife to only give patient 1/2 tablet of his prescribed dilaudid if he wakes up and c/o pain. She understands this.

## 2015-10-17 NOTE — ED Provider Notes (Signed)
[facility] DEPT Provider Note   CSN: [id number] Arrival date & time: 10/16/15  2102     History   Chief Complaint Chief Complaint  Patient presents with  . Leg Pain    HPI [name] is a 70 y.o. male.  Patient with metastatic prostate cancer followed by oncology here. Patient was some increased pain in the right thigh area that's believed to be due to a bony med in the sacral area. They increased his Neurontin on Friday but it's not helping with the pain. They're trying to arrange with a radiation oncology for some radiation treatment to the area. Patient without any neuro deficits to the right leg. Pain is 10 out of 10. Patient has tramadol at home as well but it's not helping.      Past Medical History:  Diagnosis Date  . High blood sugar   . Prostate cancer ([place]) 07/23/2011  . Radiation 04/22/14-05/06/14   right sacrum 30 gray    Patient Active Problem List   Diagnosis Date Noted  . 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 Pain of right posterior thigh 02/15/2014  . Prostate cancer ([place]) 07/23/2011    Past Surgical History:  Procedure Laterality Date  . CIRCUMCISION     when patient was 70 years old  . HAND SURGERY Left   . ROBOT ASSISTED LAPAROSCOPIC RADICAL PROSTATECTOMY    . TONSILECTOMY/ADENOIDECTOMY WITH MYRINGOTOMY         Home Medications    Prior to Admission medications   Medication Sig Start Date End Date Taking? Authorizing Provider  acetaminophen (TYLENOL) 500 MG tablet Take 1,000 mg by mouth every 4 (four) hours as needed for moderate pain.   Yes Historical Provider, MD  aspirin 81 MG tablet Take 81 mg by mouth daily.   Yes Historical Provider, MD  Calcium Carbonate-Vitamin D (CALCIUM + D PO) Take 770 mg by mouth 2 (two) times daily.    Yes Historical Provider, MD  gabapentin (NEURONTIN) 100 MG capsule Take 300-600 mg by mouth 3 (three) times daily. Takes 3 capsules in the morning and afternoon and six capsules at night.   Yes  Historical Provider, MD  glimepiride (AMARYL) 1 MG tablet Take 2 tablets (2 mg total) by mouth daily with breakfast. 08/03/15  Yes [name], MD  ipratropium (ATROVENT) 0.06 % nasal spray Place 1 spray into both nostrils 4 (four) times daily.   Yes Historical Provider, MD  isosorbide mononitrate (IMDUR) 30 MG 24 hr tablet Take 30 mg by mouth daily.   Yes Historical Provider, MD  metFORMIN (GLUCOPHAGE-XR) 500 MG 24 hr tablet Take 2 tablets (1,000 mg total) by mouth daily with breakfast. 08/03/15  Yes [name], MD  OVER THE COUNTER MEDICATION every morning. Juice Plus -- 8 capsule of each the garden, vineyard, and orchead   Yes Historical Provider, MD  predniSONE (DELTASONE) 5 MG tablet TAKE 1 TABLET BY MOUTH DAILY 03/29/15  Yes [name], MD  prochlorperazine (COMPAZINE) 10 MG tablet Take 1 tablet (10 mg total) by mouth every 6 (six) hours as needed for nausea or vomiting. 09/22/15  Yes [name], MD  traMADol (ULTRAM) 50 MG tablet Take 1 tablet (50 mg total) by mouth every 6 (six) hours as needed. Patient taking differently: Take 50 mg by mouth every 6 (six) hours as needed for moderate pain.  10/14/15  Yes [name], MD  ZETIA 10 MG tablet 10 mg daily. 06/29/14  Yes Historical Provider, MD  ZYTIGA 250 MG tablet TAKE 4 TABLETS BY MOUTH DAILY. TAKE ON AN EMPTY STOMACH 1 HOUR BEFORE OR 2 HOURS AFTER A MEAL 09/15/15  Yes [name], MD  gabapentin (NEURONTIN) 300 MG capsule Take 1 pill 2 times a day, then take 2 pills at bedtime. Patient not taking: Reported on 10/16/2015 10/14/15   [name], MD  gabapentin (NEURONTIN) 300 MG capsule Take 1 pill twice a day, then take 2 at bedtime Patient not taking: Reported on 10/16/2015 10/14/15   [name], MD  HYDROmorphone (DILAUDID) 4 MG tablet Take 1 tablet (4 mg total) by mouth every 6 (six) hours as needed for severe pain. 10/17/15   Fredia Sorrow, MD  ondansetron (ZOFRAN ODT) 4 MG disintegrating tablet Take 1 tablet (4 mg  total) by mouth every 8 (eight) hours as needed for nausea or vomiting. 10/17/15   Fredia Sorrow, MD    Family History Family History  Problem Relation Age of Onset  . Prostate cancer Father     Social History Social History  Substance Use Topics  . Smoking status: Never Smoker  . Smokeless tobacco: Never Used     Comment: never used tobacco  . Alcohol use No     Allergies   Codeine and Hydrocodone   Review of Systems Review of Systems  Constitutional: Negative for fatigue.  HENT: Negative for congestion.   Eyes: Negative for redness.  Respiratory: Negative for shortness of breath.   Cardiovascular: Negative for chest pain.  Gastrointestinal: Negative for abdominal pain.  Genitourinary: Negative for dysuria.  Musculoskeletal: Negative for back pain.  Neurological: Negative for headaches.     Physical Exam Updated Vital Signs BP 151/91 (BP Location: Left Arm)   Pulse 90   Temp 99.1 F (37.3 C) (Oral)   Resp 18   Ht _0  (1.727 m)   Wt 68.9 kg   SpO2 98%   BMI 23.11 kg/m   Physical Exam  Constitutional: He is oriented to person, place, and time. He appears well-developed and well-nourished. No distress.  HENT:  Head: Normocephalic and atraumatic.  Mouth/Throat: Oropharynx is clear and moist.  Eyes: Conjunctivae and EOM are normal. Pupils are equal, round, and reactive to light.  Neck: Normal range of motion. Neck supple.  Cardiovascular: Normal rate, regular rhythm and normal heart sounds.   Pulmonary/Chest: Effort normal and breath sounds normal. No respiratory distress.  Abdominal: Bowel sounds are normal. There is no tenderness.  Musculoskeletal: Normal range of motion.  Neurological: He is alert and oriented to person, place, and time. No cranial nerve deficit. He exhibits normal muscle tone. Coordination normal.  Skin: Skin is warm. No rash noted.  Nursing note and vitals reviewed.    ED Treatments / Results  Labs (all labs ordered are listed,  but only abnormal results are displayed) Labs Reviewed - No data to display  EKG  EKG Interpretation None       Radiology No results found.  Procedures Procedures (including critical care time)  Medications Ordered in ED Medications  ondansetron (ZOFRAN-ODT) disintegrating tablet 4 mg (4 mg Oral Given 10/16/15 2237)  HYDROmorphone (DILAUDID) injection 2 mg (2 mg Intramuscular Given 10/16/15 2237)     Initial Impression / Assessment and Plan / ED Course  I have reviewed the triage vital signs and the nursing notes.  Pertinent labs & imaging results that were available during my care of the patient were reviewed by me and considered in my medical decision making (see chart  for details).  Clinical Course   Patient with metastatic prostate cancer followed by Dr. Fran Lowes. Patient with a sacral met on the right side that is believed to be the cause of the patient's right thigh pain. Patient with increased pain down to the knee on the right side. Patient last seen by Dr. Fran Lowes on Friday, September 29. They increased his Neurontin but that's not helping. Patient treated here with hydromorphone IM 2 mg and Zofran to preventing nausea and vomiting with significant improvement. We'll continue at home with oral hydromorphone and Zofran. He will follow-up with his oncology doctor they are trying to arrange radiation therapy to the area.   Final Clinical Impressions(s) / ED Diagnoses   Final diagnoses:  Right leg pain    New Prescriptions New Prescriptions   HYDROMORPHONE (DILAUDID) 4 MG TABLET    Take 1 tablet (4 mg total) by mouth every 6 (six) hours as needed for severe pain.   ONDANSETRON (ZOFRAN ODT) 4 MG DISINTEGRATING TABLET    Take 1 tablet (4 mg total) by mouth every 8 (eight) hours as needed for nausea or vomiting.     Fredia Sorrow, MD 10/17/15 [phone]

## 2015-10-17 NOTE — Discharge Instructions (Signed)
Take the hydromorphone as directed. Take the zofran to prevent nausea vomiting. You can continue to take the tramadol. Follow up with you Oncology doctor.

## 2015-10-18 MED FILL — GABAPENTIN 100 MG CAPSULE: 100 | 30 days supply | Qty: 90 | Fill #1

## 2015-10-19 ENCOUNTER — Ambulatory Visit
Admission: RE | Admit: 2015-10-19 | Discharge: 2015-10-19 | Disposition: A | Discharge status: Home / Self Care | Payer: Medicare Other | Source: Ambulatory Visit | Attending: Radiation Oncology | Admitting: Radiation Oncology

## 2015-10-19 ENCOUNTER — Encounter: Payer: Self-pay | Admitting: Radiation Oncology

## 2015-10-19 VITALS — BP 135/80 | HR 107 | Temp 99.1°F

## 2015-10-19 DIAGNOSIS — C7951 Secondary malignant neoplasm of bone: Secondary | ICD-10-CM

## 2015-10-19 DIAGNOSIS — Z7984 Long term (current) use of oral hypoglycemic drugs: Secondary | ICD-10-CM | POA: Diagnosis not present

## 2015-10-19 DIAGNOSIS — R739 Hyperglycemia, unspecified: Secondary | ICD-10-CM | POA: Diagnosis not present

## 2015-10-19 DIAGNOSIS — C61 Malignant neoplasm of prostate: Secondary | ICD-10-CM | POA: Diagnosis not present

## 2015-10-19 DIAGNOSIS — Z79899 Other long term (current) drug therapy: Secondary | ICD-10-CM | POA: Diagnosis not present

## 2015-10-19 DIAGNOSIS — Z510 Encounter for antineoplastic radiation therapy: Secondary | ICD-10-CM | POA: Diagnosis not present

## 2015-10-19 DIAGNOSIS — Z7982 Long term (current) use of aspirin: Secondary | ICD-10-CM | POA: Diagnosis not present

## 2015-10-19 LAB — CBC WITH DIFFERENTIAL/PLATELET
BASO%: 0.1 % (ref 0.0–2.0)
Basophils Absolute: 0 10*3/uL (ref 0.0–0.1)
EOS%: 1.2 % (ref 0.0–7.0)
Eosinophils Absolute: 0.1 10*3/uL (ref 0.0–0.5)
HCT: 35.8 % — ABNORMAL LOW (ref 38.4–49.9)
HGB: 11.9 g/dL — ABNORMAL LOW (ref 13.0–17.1)
LYMPH%: 9.8 % — ABNORMAL LOW (ref 14.0–49.0)
MCH: 31.4 pg (ref 27.2–33.4)
MCHC: 33.2 g/dL (ref 32.0–36.0)
MCV: 94.5 fL (ref 79.3–98.0)
MONO#: 1.2 10*3/uL — ABNORMAL HIGH (ref 0.1–0.9)
MONO%: 11.6 % (ref 0.0–14.0)
NEUT#: 7.7 10*3/uL — ABNORMAL HIGH (ref 1.5–6.5)
NEUT%: 77.3 % — ABNORMAL HIGH (ref 39.0–75.0)
Platelets: 265 10*3/uL (ref 140–400)
RBC: 3.79 10*6/uL — ABNORMAL LOW (ref 4.20–5.82)
RDW: 13.1 % (ref 11.0–14.6)
WBC: 10 10*3/uL (ref 4.0–10.3)
lymph#: 1 10*3/uL (ref 0.9–3.3)

## 2015-10-19 NOTE — Progress Notes (Signed)
Radiation Oncology         [phone] ________________________________  Name: [name] MRN: [id number]  Date: 10/19/2015  DOB: Jul 31, 1945  Re-evaluation Visit Note  CC: Nicoletta Dress, MD  [name], MD    ICD-9-CM ICD-10-CM   1. 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  He was originally diagnosed with prostate cancer in 2013. He mentions the pain in his sacrum was sporadic when it first started. From January 2016- January 2017, he would have pain in his back thigh that would wake him up at night. He had a bone scan that showed more activity. He has been seeing a chiropractor and has not been having the pain as frequently. He has been taking Zytiga for the past two years.  He had an MRI of the lumbar spine and sacrum on 08/10/15. This showed a metastatic lesion in the right sacrum that extends into the epidural space on the right to the inferior margin of S1 and obliterates the right S2 foramen impinging on hte right S2. This lesion also extends into the superior margin of the right S3 foramen and could impact the right S3 root. There was also a second 1 cm metastatic deposit in the posterior aspect of the left ilium.  Of note, he reports having a fall in his bathroom on Monday. He has a purple/red bruise on his right side. He notes, weakness in the right leg. He also reports having incontinence of urine most of the day that started on Sunday. He also reports having a weak urinary stream.         His pain was so significant over the weekend that he had to present to the emergency room for relief. Patient is now taking Dilaudid for pain relief. He has had increase in his Neurontin medication by Dr.  Marin Olp.                   ALLERGIES:  is allergic to codeine and hydrocodone.  Meds: Current Outpatient Prescriptions  Medication Sig Dispense Refill  . acetaminophen (TYLENOL) 500 MG tablet Take 1,000 mg by mouth every 4 (four) hours as needed for moderate pain.    Marland Kitchen aspirin 81 MG tablet Take 81 mg by mouth daily.    . Calcium Carbonate-Vitamin D (CALCIUM + D PO) Take 770 mg by mouth 2 (two) times daily.     Marland Kitchen gabapentin (NEURONTIN) 100 MG capsule Take 300-600 mg by mouth 3 (three) times daily. Takes 3 capsules in the morning and afternoon and six capsules at night.    . gabapentin (NEURONTIN) 300 MG capsule Take 1 pill 2 times a day, then take 2 pills at bedtime. (Patient not taking: Reported on 10/16/2015) 120 capsule 2  . gabapentin (NEURONTIN) 300 MG capsule Take 1 pill twice a day, then take 2 at bedtime (Patient not taking: Reported on 10/16/2015) 120 capsule 4  . glimepiride (AMARYL) 1 MG tablet Take 2 tablets (2 mg total) by mouth daily with breakfast. 180 tablet 2  . HYDROmorphone (DILAUDID) 4 MG tablet Take 1 tablet (4 mg total) by mouth every 6 (six) hours as needed for severe pain. 20 tablet 0  . ipratropium (ATROVENT) 0.06 % nasal  spray Place 1 spray into both nostrils 4 (four) times daily.    . isosorbide mononitrate (IMDUR) 30 MG 24 hr tablet Take 30 mg by mouth daily.    . metFORMIN (GLUCOPHAGE-XR) 500 MG 24 hr tablet Take 2 tablets (1,000 mg total) by mouth daily with breakfast. 180 tablet 2  . ondansetron (ZOFRAN ODT) 4 MG disintegrating tablet Take 1 tablet (4 mg total) by mouth every 8 (eight) hours as needed for nausea or vomiting. 12 tablet 2  . OVER THE COUNTER MEDICATION every morning. Juice Plus -- 8 capsule of each the garden, vineyard, and orchead    . predniSONE (DELTASONE) 5 MG tablet TAKE 1 TABLET BY MOUTH DAILY 60 tablet 3  . prochlorperazine (COMPAZINE) 10 MG tablet Take 1 tablet (10 mg total) by mouth every 6 (six) hours as needed for nausea or vomiting. 30  tablet 0  . traMADol (ULTRAM) 50 MG tablet Take 1 tablet (50 mg total) by mouth every 6 (six) hours as needed. (Patient taking differently: Take 50 mg by mouth every 6 (six) hours as needed for moderate pain. ) 90 tablet 2  . ZETIA 10 MG tablet 10 mg daily.    Marland Kitchen ZYTIGA 250 MG tablet TAKE 4 TABLETS BY MOUTH DAILY. TAKE ON AN EMPTY STOMACH 1 HOUR BEFORE OR 2 HOURS AFTER A MEAL 120 tablet 3   No current facility-administered medications for this encounter.     Physical Findings: The patient is in no acute distress. Patient is alert and oriented.  oral temperature is 99.1 F (37.3 C). His blood pressure is 135/80 and his pulse is 107 (abnormal). His oxygen saturation is 99%. .  No significant changes. General: Alert and oriented, in no acute distress Abdomen: Soft, nontender, nondistended, with no rigidity or guarding. Extremities: No cyanosis or edema. Musculoskeletal: symmetric strength and muscle tone throughout. Psychiatric: Judgment and insight are intact. Affect is appropriate.   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 Patient is more symptomatic from his area along the right sacrum. This is affecting his quality of life significantly. In light of this I would recommend retreatment to this area. Patient will be treated with IMRT to limit dose to normal surrounding critical structures. may possibly consider SBRT for this situation but will discuss with colleagues. The patient and his wife understand their increased risk for complications given this is a  retreatment. Given the patient's pain level he is willing to accept this risk.  Plan:  Continue Xofigo. Planning will occur immediately following this visit today for treatments directed the right sacrum.  ____________________________________  This document serves as a record of services personally performed by [name], MD. It was created on his behalf by Bethann Humble, a trained medical scribe. The creation of this record is based on the scribe's personal observations and the provider's statements to them. This document has been checked and approved by the attending provider.

## 2015-10-19 NOTE — Progress Notes (Signed)
[name] is here for CT SIM.  He reports having a fall in his bathroom on Monday.  He has a purple/red bruise on his right side.  He also reports having incontinence of urine most of the day that started on Sunday.  He also reports having a weak urinary stream.  BP 135/80 (BP Location: Left Arm, Patient Position: Sitting)   Pulse (!) 107   Temp 99.1 F (37.3 C) (Oral)   SpO2 99%

## 2015-10-20 ENCOUNTER — Ambulatory Visit: Payer: Medicare Other

## 2015-10-24 ENCOUNTER — Telehealth: Payer: Self-pay | Admitting: *Deleted

## 2015-10-24 NOTE — Telephone Encounter (Signed)
Patient called stating that he has been having some blood in stools, bright red.  He also has had hemorrhoids for the past week or so.  Informed Dr. Marin Olp.  Dr. Marin Olp stated to use preparation H for hemorroids.

## 2015-10-25 DIAGNOSIS — C61 Malignant neoplasm of prostate: Secondary | ICD-10-CM | POA: Diagnosis not present

## 2015-10-25 DIAGNOSIS — C7951 Secondary malignant neoplasm of bone: Secondary | ICD-10-CM | POA: Diagnosis not present

## 2015-10-25 DIAGNOSIS — Z7982 Long term (current) use of aspirin: Secondary | ICD-10-CM | POA: Diagnosis not present

## 2015-10-25 DIAGNOSIS — Z510 Encounter for antineoplastic radiation therapy: Secondary | ICD-10-CM | POA: Diagnosis not present

## 2015-10-25 DIAGNOSIS — R739 Hyperglycemia, unspecified: Secondary | ICD-10-CM | POA: Diagnosis not present

## 2015-10-25 DIAGNOSIS — Z7984 Long term (current) use of oral hypoglycemic drugs: Secondary | ICD-10-CM | POA: Diagnosis not present

## 2015-10-26 ENCOUNTER — Ambulatory Visit
Admission: RE | Admit: 2015-10-26 | Discharge: 2015-10-26 | Disposition: A | Discharge status: Home / Self Care | Payer: Medicare Other | Source: Ambulatory Visit | Attending: Radiation Oncology | Admitting: Radiation Oncology

## 2015-10-26 ENCOUNTER — Encounter: Payer: Self-pay | Admitting: Radiation Oncology

## 2015-10-26 ENCOUNTER — Telehealth: Payer: Self-pay | Admitting: *Deleted

## 2015-10-26 VITALS — BP 114/66 | HR 86 | Temp 98.5°F | Ht 68.0 in | Wt 156.8 lb

## 2015-10-26 DIAGNOSIS — C61 Malignant neoplasm of prostate: Secondary | ICD-10-CM

## 2015-10-26 DIAGNOSIS — Z510 Encounter for antineoplastic radiation therapy: Secondary | ICD-10-CM | POA: Diagnosis not present

## 2015-10-26 DIAGNOSIS — Z7982 Long term (current) use of aspirin: Secondary | ICD-10-CM | POA: Diagnosis not present

## 2015-10-26 DIAGNOSIS — C7951 Secondary malignant neoplasm of bone: Secondary | ICD-10-CM

## 2015-10-26 DIAGNOSIS — R739 Hyperglycemia, unspecified: Secondary | ICD-10-CM | POA: Diagnosis not present

## 2015-10-26 DIAGNOSIS — Z7984 Long term (current) use of oral hypoglycemic drugs: Secondary | ICD-10-CM | POA: Diagnosis not present

## 2015-10-26 NOTE — Progress Notes (Signed)
  Radiation Oncology         [phone] ________________________________  Name: [name] MRN: [id number]  Date: 10/26/2015  DOB: 03-Jun-1945  Weekly Radiation Therapy Management    ICD-9-CM ICD-10-CM   1. Cancer, metastatic to bone ([facility]) 198.5 C79.51   2. Prostate cancer ([place]) 185 C61      Current Dose: 6 Gy     Planned Dose:  30 Gy  Narrative . . . . . . . . The patient presents for routine under treatment assessment. his HGB has dropped from 12.8 to 11.9.  He reports having bright red blood in his stools since 10/4.  He said it does not happen every time and that the first time it happened it filled the commode.  He reports that he did not see any blood with his last bowel movement.  He denies feeling tired or weak.  He continues to report having urinary incontinence.                                   Pain is better in the right pelvis/sacrum area patient has had increase in his Neurontin.                                 Set-up films were reviewed.                                 The chart was checked. Physical Findings. . .  height is '5\' 8"'$  (1.727 m) and weight is 156 lb 12.8 oz (71.1 kg). His oral temperature is 98.5 F (36.9 C). His blood pressure is 114/66 and his pulse is 86. His oxygen saturation is 99%. . Weight essentially stable.  No significant changes.  Lungs are clear to auscultation bilaterally. Heart has regular rate and rhythm.  Abdomen soft, non-tender, normal bowel sounds. Impression . . . . . . . The patient is tolerating radiation.  Plan . . . . . . . . . . . . Continue treatment as planned. Patient will proceed with his radium 223 injection tomorrow. He will receive his 2nd SBRT on October 16.  ________________________________    Blair Promise, PhD, MD

## 2015-10-26 NOTE — Progress Notes (Signed)
[name] is here for first treatment to his right sacrum.  He wants to make sure Dr. Sondra Come knows that his HGB has dropped from 12.8 to 11.9.  He reports having bright red blood in his stools since 10/4.  He said it does not happen every time and that the first time it happened it filled the commode.  He reports that he did not see any blood with his last bowel movement.  He denies feeling tired or weak.  He continues to report having urinary incontinence.  BP 114/66 (BP Location: Left Arm, Patient Position: Sitting)   Pulse 86   Temp 98.5 F (36.9 C) (Oral)   Ht '5\' 8"'$  (1.727 m)   Wt 156 lb 12.8 oz (71.1 kg)   SpO2 99%   BMI 23.84 kg/m    Wt Readings from Last 3 Encounters:  10/26/15 156 lb 12.8 oz (71.1 kg)  10/16/15 152 lb (68.9 kg)  10/14/15 157 lb 12.8 oz (71.6 kg)

## 2015-10-26 NOTE — Telephone Encounter (Signed)
CALLED PATIENT TO REMIND OF XOFIGO INJ., SPOKE WITH PATIENT'S WIFE AND SHE IS AWARE OF THIS INJ.

## 2015-10-26 NOTE — Progress Notes (Signed)
  Radiation Oncology         [phone] ________________________________  Name: [name] MRN: [id number]  Date: 10/26/2015  DOB: Oct 22, 1945  Stereotactic Body Radiotherapy Treatment Procedure Note  NARRATIVE:  [name] was brought to the stereotactic radiation treatment machine and placed supine on the CT couch. The patient was set up for stereotactic body radiotherapy on the body fix pillow.  3D TREATMENT PLANNING AND DOSIMETRY:  The patient's radiation plan was reviewed and approved prior to starting treatment.  It showed 3-dimensional radiation distributions overlaid onto the planning CT.  The [facility] for the target structures as well as the organs at risk were reviewed. The documentation of this is filed in the radiation oncology EMR.  SIMULATION VERIFICATION:  The patient underwent CT imaging on the treatment unit.  These were carefully aligned to document that the ablative radiation dose would cover the target volume and maximally spare the nearby organs at risk according to the planned distribution.  SPECIAL TREATMENT PROCEDURE: [name] received high dose ablative stereotactic body radiotherapy to the planned target volume without unforeseen complications. Treatment was delivered uneventfully. The high doses associated with stereotactic body radiotherapy and the significant potential risks require careful treatment set up and patient monitoring constituting a special treatment procedure   STEREOTACTIC TREATMENT MANAGEMENT:  Following delivery, the patient was evaluated clinically. The patient tolerated treatment without significant acute effects, and was discharged to home in stable condition.    PLAN: Continue treatment as planned.  ________________________________  Blair Promise, PhD, MD

## 2015-10-27 ENCOUNTER — Encounter (HOSPITAL_COMMUNITY)
Admission: RE | Admit: 2015-10-27 | Discharge: 2015-10-27 | Disposition: A | Discharge status: Home / Self Care | Payer: Medicare Other | Source: Ambulatory Visit | Attending: Radiation Oncology | Admitting: Radiation Oncology

## 2015-10-27 DIAGNOSIS — C61 Malignant neoplasm of prostate: Secondary | ICD-10-CM | POA: Diagnosis not present

## 2015-10-27 DIAGNOSIS — C7951 Secondary malignant neoplasm of bone: Secondary | ICD-10-CM | POA: Diagnosis not present

## 2015-10-27 NOTE — Progress Notes (Signed)
  Radiation Oncology         [phone] ________________________________  Name: [name] MRN: [id number]  Date: 10/27/2015  DOB: 08-21-1945  Radium-223 Infusion Note  Diagnosis:  Castration resistant prostate cancer with painful bone involvement  Current Infusion:    2  Planned Infusions:  6  Narrative: [name] presented to nuclear medicine for treatment. His most recent blood counts were reviewed.  He remains a good candidate to proceed with Ra-223.  The patient was situated in an infusion suite with a contact barrier placed under his arm. Intravenous access was established, using sterile technique, and a normal saline infusion from a syringe was started.  Micro-dosimetry:  The prescribed radiation activity was assayed and confirmed to be within specified tolerance.  Special Treatment Procedure - Infusion:  The nuclear medicine technologist and I personally verified the dose activity to be delivered as specified in the written directive, and verified the patient identification via 2 separate methods.  The syringe containing the dose was attached to a 3 way stopcock, and then the valve was opened to the patient, and the dose delivered over a minute. No complications were noted.  The total administered dose was 104.8 microcuries in a volume of 4.18 cc.  A saline flush of the line and the syringe that contained the isotope was then performed.  The residual radioactivity in the syringe was 3.7 microcuries, so the actual infused isotope activity was 101.20mcrocuries.   Pressure was applied to the venipuncture site, and a compression bandage placed.   Radiation Safety personnel were present to perform the discharge survey, as detailed on their documentation.   After a short period of observation, the patient had his IV removed.  Impression:  The patient tolerated his infusion relatively well.  Plan:  The patient will return in one month for ongoing care.      -----------------------------------  JBlair Promise PhD, MD

## 2015-10-28 ENCOUNTER — Other Ambulatory Visit: Payer: Self-pay | Admitting: Radiation Oncology

## 2015-10-28 ENCOUNTER — Telehealth: Payer: Self-pay | Admitting: *Deleted

## 2015-10-28 DIAGNOSIS — C61 Malignant neoplasm of prostate: Secondary | ICD-10-CM

## 2015-10-28 DIAGNOSIS — C7951 Secondary malignant neoplasm of bone: Principal | ICD-10-CM

## 2015-10-28 NOTE — Telephone Encounter (Signed)
CALLED PATIENT TO INFORM OF LAB AND WEIGHT ON 11-24-15  @ 12 PM AND HIS XOFIGO INJ. ON 12-01-15- ARRIVAL TIME - 11:45 AM @ [facility], SPOKE WITH PATIENT'S WIFE- SHEILA AND THEY ARE AWARE OF THIS INJ.

## 2015-10-31 ENCOUNTER — Ambulatory Visit
Admission: RE | Admit: 2015-10-31 | Discharge: 2015-10-31 | Disposition: A | Discharge status: Home / Self Care | Payer: Medicare Other | Source: Ambulatory Visit | Attending: Radiation Oncology | Admitting: Radiation Oncology

## 2015-10-31 DIAGNOSIS — C61 Malignant neoplasm of prostate: Secondary | ICD-10-CM | POA: Diagnosis not present

## 2015-10-31 DIAGNOSIS — C7951 Secondary malignant neoplasm of bone: Secondary | ICD-10-CM | POA: Diagnosis not present

## 2015-10-31 DIAGNOSIS — Z510 Encounter for antineoplastic radiation therapy: Secondary | ICD-10-CM | POA: Diagnosis not present

## 2015-10-31 DIAGNOSIS — Z7984 Long term (current) use of oral hypoglycemic drugs: Secondary | ICD-10-CM | POA: Diagnosis not present

## 2015-10-31 DIAGNOSIS — Z7982 Long term (current) use of aspirin: Secondary | ICD-10-CM | POA: Diagnosis not present

## 2015-10-31 DIAGNOSIS — R739 Hyperglycemia, unspecified: Secondary | ICD-10-CM | POA: Diagnosis not present

## 2015-10-31 NOTE — Progress Notes (Signed)
  Radiation Oncology         [phone] ________________________________  Name: [name] MRN: [id number]  Date: 10/31/2015  DOB: 08-Oct-1945  Stereotactic Body Radiotherapy Treatment Procedure Note  NARRATIVE:  [name] was brought to the stereotactic radiation treatment machine and placed supine on the CT couch. The patient was set up for stereotactic body radiotherapy on the body fix pillow.  3D TREATMENT PLANNING AND DOSIMETRY:  The patient's radiation plan was reviewed and approved prior to starting treatment.  It showed 3-dimensional radiation distributions overlaid onto the planning CT.  The [name] for the target structures as well as the organs at risk were reviewed. The documentation of this is filed in the radiation oncology EMR.  SIMULATION VERIFICATION:  The patient underwent CT imaging on the treatment unit.  These were carefully aligned to document that the ablative radiation dose would cover the target volume and maximally spare the nearby organs at risk according to the planned distribution.  SPECIAL TREATMENT PROCEDURE: [name] received high dose ablative stereotactic body radiotherapy to the planned target volume without unforeseen complications. Treatment was delivered uneventfully. The high doses associated with stereotactic body radiotherapy and the significant potential risks require careful treatment set up and patient monitoring constituting a special treatment procedure   STEREOTACTIC TREATMENT MANAGEMENT:  Following delivery, the patient was evaluated clinically. The patient tolerated treatment without significant acute effects, and was discharged to home in stable condition.    PLAN: Continue treatment as planned.  ________________________________  Blair Promise, PhD, MD  This document serves as a record of services personally performed by [name], MD. It was created on his behalf by Darcus Austin, a trained medical scribe. The creation of this  record is based on the scribe's personal observations and the provider's statements to them. This document has been checked and approved by the attending provider.

## 2015-11-01 ENCOUNTER — Ambulatory Visit
Admission: RE | Admit: 2015-11-01 | Discharge: 2015-11-01 | Disposition: A | Discharge status: Home / Self Care | Payer: Medicare Other | Source: Ambulatory Visit | Attending: Radiation Oncology | Admitting: Radiation Oncology

## 2015-11-01 ENCOUNTER — Ambulatory Visit: Admission: RE | Admit: 2015-11-01 | Payer: Medicare Other | Source: Ambulatory Visit | Admitting: Radiation Oncology

## 2015-11-02 ENCOUNTER — Ambulatory Visit
Admission: RE | Admit: 2015-11-02 | Discharge: 2015-11-02 | Disposition: A | Discharge status: Home / Self Care | Payer: Medicare Other | Source: Ambulatory Visit | Attending: Radiation Oncology | Admitting: Radiation Oncology

## 2015-11-02 DIAGNOSIS — C7951 Secondary malignant neoplasm of bone: Secondary | ICD-10-CM

## 2015-11-02 DIAGNOSIS — R739 Hyperglycemia, unspecified: Secondary | ICD-10-CM | POA: Diagnosis not present

## 2015-11-02 DIAGNOSIS — Z510 Encounter for antineoplastic radiation therapy: Secondary | ICD-10-CM | POA: Diagnosis not present

## 2015-11-02 DIAGNOSIS — C61 Malignant neoplasm of prostate: Secondary | ICD-10-CM | POA: Diagnosis not present

## 2015-11-02 DIAGNOSIS — Z7984 Long term (current) use of oral hypoglycemic drugs: Secondary | ICD-10-CM | POA: Diagnosis not present

## 2015-11-02 DIAGNOSIS — Z7982 Long term (current) use of aspirin: Secondary | ICD-10-CM | POA: Diagnosis not present

## 2015-11-02 NOTE — Progress Notes (Signed)
  Radiation Oncology         (336) [phone] ________________________________  Name: [name] MRN: [id number]  Date: 11/02/2015  DOB: 06/23/1945  Stereotactic Body Radiotherapy Treatment Procedure Note  NARRATIVE:  [name] was brought to the stereotactic radiation treatment machine and placed supine on the CT couch. The patient was set up for stereotactic body radiotherapy on the body fix pillow.  3D TREATMENT PLANNING AND DOSIMETRY:  The patient's radiation plan was reviewed and approved prior to starting treatment.  It showed 3-dimensional radiation distributions overlaid onto the planning CT.  [facility] for the target structures as well as the organs at risk were reviewed. The documentation of this is filed in the radiation oncology EMR.  SIMULATION VERIFICATION:  The patient underwent CT imaging on the treatment unit.  These were carefully aligned to document that the ablative radiation dose would cover the target volume and maximally spare the nearby organs at risk according to the planned distribution.  SPECIAL TREATMENT PROCEDURE: [name] received high dose ablative stereotactic body radiotherapy to the planned target volume without unforeseen complications. Treatment was delivered uneventfully. The high doses associated with stereotactic body radiotherapy and the significant potential risks require careful treatment set up and patient monitoring constituting a special treatment procedure   STEREOTACTIC TREATMENT MANAGEMENT:  Following delivery, the patient was evaluated clinically. The patient tolerated treatment without significant acute effects, and was discharged to home in stable condition.    PLAN: Continue treatment as planned.  ________________________________  Blair Promise, PhD, MD

## 2015-11-03 ENCOUNTER — Ambulatory Visit: Payer: Medicare Other | Admitting: Radiation Oncology

## 2015-11-04 ENCOUNTER — Encounter: Payer: Self-pay | Admitting: Hematology & Oncology

## 2015-11-04 ENCOUNTER — Other Ambulatory Visit (HOSPITAL_BASED_OUTPATIENT_CLINIC_OR_DEPARTMENT_OTHER): Payer: Medicare Other

## 2015-11-04 ENCOUNTER — Ambulatory Visit: Payer: Medicare Other | Admitting: Radiation Oncology

## 2015-11-04 ENCOUNTER — Ambulatory Visit (HOSPITAL_BASED_OUTPATIENT_CLINIC_OR_DEPARTMENT_OTHER): Payer: Medicare Other | Admitting: Hematology & Oncology

## 2015-11-04 VITALS — BP 126/68 | HR 84 | Temp 98.0°F | Resp 16 | Ht 68.0 in | Wt 158.0 lb

## 2015-11-04 DIAGNOSIS — C801 Malignant (primary) neoplasm, unspecified: Secondary | ICD-10-CM

## 2015-11-04 DIAGNOSIS — C7951 Secondary malignant neoplasm of bone: Secondary | ICD-10-CM

## 2015-11-04 DIAGNOSIS — C61 Malignant neoplasm of prostate: Secondary | ICD-10-CM

## 2015-11-04 DIAGNOSIS — G893 Neoplasm related pain (acute) (chronic): Secondary | ICD-10-CM

## 2015-11-04 DIAGNOSIS — G63 Polyneuropathy in diseases classified elsewhere: Secondary | ICD-10-CM | POA: Diagnosis not present

## 2015-11-04 LAB — CBC WITH DIFFERENTIAL (CANCER CENTER ONLY)
BASO#: 0 10*3/uL (ref 0.0–0.2)
BASO%: 0.7 % (ref 0.0–2.0)
EOS%: 4 % (ref 0.0–7.0)
Eosinophils Absolute: 0.2 10*3/uL (ref 0.0–0.5)
HCT: 36.6 % — ABNORMAL LOW (ref 38.7–49.9)
HGB: 12 g/dL — ABNORMAL LOW (ref 13.0–17.1)
LYMPH#: 0.9 10*3/uL (ref 0.9–3.3)
LYMPH%: 19.2 % (ref 14.0–48.0)
MCH: 31.9 pg (ref 28.0–33.4)
MCHC: 32.8 g/dL (ref 32.0–35.9)
MCV: 97 fL (ref 82–98)
MONO#: 0.4 10*3/uL (ref 0.1–0.9)
MONO%: 8.9 % (ref 0.0–13.0)
NEUT#: 3 10*3/uL (ref 1.5–6.5)
NEUT%: 67.2 % (ref 40.0–80.0)
Platelets: 281 10*3/uL (ref 145–400)
RBC: 3.76 10*6/uL — ABNORMAL LOW (ref 4.20–5.70)
RDW: 13 % (ref 11.1–15.7)
WBC: 4.5 10*3/uL (ref 4.0–10.0)

## 2015-11-04 LAB — CMP (CANCER CENTER ONLY)
ALT(SGPT): 24 U/L (ref 10–47)
AST: 22 U/L (ref 11–38)
Albumin: 3.4 g/dL (ref 3.3–5.5)
Alkaline Phosphatase: 63 U/L (ref 26–84)
BUN, Bld: 18 mg/dL (ref 7–22)
CO2: 29 mEq/L (ref 18–33)
Calcium: 9.6 mg/dL (ref 8.0–10.3)
Chloride: 104 mEq/L (ref 98–108)
Creat: 1.2 mg/dl (ref 0.6–1.2)
Glucose, Bld: 102 mg/dL (ref 73–118)
Potassium: 4.2 mEq/L (ref 3.3–4.7)
Sodium: 141 mEq/L (ref 128–145)
Total Bilirubin: 0.5 mg/dl (ref 0.20–1.60)
Total Protein: 6.7 g/dL (ref 6.4–8.1)

## 2015-11-04 NOTE — Progress Notes (Signed)
Hematology and Oncology Follow Up Visit  [name] [id number] 1945/04/04 70 y.o. 11/04/2015   Principle Diagnosis:   Metastatic castrate resistant prostate cancer  Current Therapy:    Zytiga 1000 mg by mouth daily  Xgeva 120 mg subcutaneous Q3 months - due in  Sept  Lupron 22 mg IM every 3 months - due in Sept  Radium-223 therapy -- s/p cycle #2  Palliative radiation to the right sacrum     Interim History:  [name] is back for followup. He looks much better. He feels much better. He started palliative radiation to the right sacrum. This is decreased the pain in his right thigh.  He has had 2 cycles of the Radium-223. He gets this monthly.  His PSA has always been less than 0.1. This is what is surprising in that the PSA just has not correlated all that well with his disease activity. Not even the bone scan that we've gotten on him have been all that accurate. I think that in the future we probably will have to consider MRIs.   His blood sugars have been doing a little better. I think we try to get him off the glimiperide.  He will stay on metformin.   He's had no problems with his heart. He is still working.   His appetite is doing okay. He is trying to watch what he eats. His wife is doing very good job with him.   Overall, I'll stated performance status is ECOG 0.   He has 2 more radiation treatments for the right sacrum.    Medications:  Current Outpatient Prescriptions:  .  acetaminophen (TYLENOL) 500 MG tablet, Take 1,000 mg by mouth every 4 (four) hours as needed for moderate pain., Disp: , Rfl:  .  aspirin 81 MG tablet, Take 81 mg by mouth daily., Disp: , Rfl:  .  Calcium Carbonate-Vitamin D (CALCIUM + D PO), Take 770 mg by mouth 2 (two) times daily. , Disp: , Rfl:  .  gabapentin (NEURONTIN) 300 MG capsule, Take 1 pill twice a day, then take 2 at bedtime, Disp: 120 capsule, Rfl: 4 .  glimepiride (AMARYL) 1 MG tablet, Take 2 tablets (2 mg total) by mouth  daily with breakfast., Disp: 180 tablet, Rfl: 2 .  HYDROmorphone (DILAUDID) 4 MG tablet, Take 1 tablet (4 mg total) by mouth every 6 (six) hours as needed for severe pain., Disp: 20 tablet, Rfl: 0 .  ipratropium (ATROVENT) 0.06 % nasal spray, Place 1 spray into both nostrils 4 (four) times daily., Disp: , Rfl:  .  isosorbide mononitrate (IMDUR) 30 MG 24 hr tablet, Take 30 mg by mouth daily., Disp: , Rfl:  .  metFORMIN (GLUCOPHAGE-XR) 500 MG 24 hr tablet, Take 2 tablets (1,000 mg total) by mouth daily with breakfast., Disp: 180 tablet, Rfl: 2 .  ondansetron (ZOFRAN ODT) 4 MG disintegrating tablet, Take 1 tablet (4 mg total) by mouth every 8 (eight) hours as needed for nausea or vomiting., Disp: 12 tablet, Rfl: 2 .  OVER THE COUNTER MEDICATION, every morning. Juice Plus -- 8 capsule of each the garden, vineyard, and orchead, Disp: , Rfl:  .  predniSONE (DELTASONE) 5 MG tablet, TAKE 1 TABLET BY MOUTH DAILY, Disp: 60 tablet, Rfl: 3 .  prochlorperazine (COMPAZINE) 10 MG tablet, Take 1 tablet (10 mg total) by mouth every 6 (six) hours as needed for nausea or vomiting., Disp: 30 tablet, Rfl: 0 .  traMADol (ULTRAM) 50 MG tablet, Take 1 tablet (  50 mg total) by mouth every 6 (six) hours as needed. (Patient taking differently: Take 50 mg by mouth every 6 (six) hours as needed for moderate pain. ), Disp: 90 tablet, Rfl: 2 .  ZETIA 10 MG tablet, 10 mg daily., Disp: , Rfl:  .  ZYTIGA 250 MG tablet, TAKE 4 TABLETS BY MOUTH DAILY. TAKE ON AN EMPTY STOMACH 1 HOUR BEFORE OR 2 HOURS AFTER A MEAL, Disp: 120 tablet, Rfl: 3  Allergies:  Allergies  Allergen Reactions  . Codeine Nausea And Vomiting  . Hydrocodone Nausea And Vomiting    Past Medical History, Surgical history, Social history, and Family History were reviewed and updated.  Review of Systems: As above  Physical Exam:  height is '5\' 8"'$  (1.727 m) and weight is 158 lb (71.7 kg). His oral temperature is 98 F (36.7 C). His blood pressure is 126/68 and his  pulse is 84. His respiration is 16.   Well-developed and well-nourished white male. Head and neck exam shows no ocular or oral lesions. The are no palpable cervical or supraclavicular lymph nodes. Lungs are clear with no rales, wheezes or rhonchi. Cardiac exam regular rate and rhythm with no murmurs rubs or bruits. Abdomen is soft. He has good bowel sounds. There is no fluid wave. There is no palpable liver or spleen tip. Back exam shows no tenderness over the spine ribs or hips. I cannot elicit any tenderness over the sacrum bilaterally. He has good range of motion of his pelvis. Extremities shows no clubbing, cyanosis or edema. Skin exam shows areas of urticaria on the left forearm.. There is also an area as solitary in the upper chest wall in the upper sternal region. These are nontender. There are erythematous with a central area of vesicle. No erythema to the skin is noted. Neurological exam is nonfocal.   Lab Results  Component Value Date   WBC 4.5 11/04/2015   HGB 12.0 (L) 11/04/2015   HCT 36.6 (L) 11/04/2015   MCV 97 11/04/2015   PLT 281 11/04/2015     Chemistry      Component Value Date/Time   NA 141 11/04/2015 0930   NA 141 10/14/2015 0803   K 4.2 11/04/2015 0930   K 4.2 10/14/2015 0803   CL 104 11/04/2015 0930   CO2 29 11/04/2015 0930   CO2 27 10/14/2015 0803   BUN 18 11/04/2015 0930   BUN 17.9 10/14/2015 0803   CREATININE 1.2 11/04/2015 0930   CREATININE 1.2 10/14/2015 0803      Component Value Date/Time   CALCIUM 9.6 11/04/2015 0930   CALCIUM 10.0 10/14/2015 0803   ALKPHOS 63 11/04/2015 0930   ALKPHOS 78 10/14/2015 0803   AST 22 11/04/2015 0930   AST 17 10/14/2015 0803   ALT 24 11/04/2015 0930   ALT 12 10/14/2015 0803   BILITOT 0.50 11/04/2015 0930   BILITOT 0.45 10/14/2015 0803         Impression and Plan: [name] is 70 year old gentleman with metastatic prostate cancer.   It is nice to see that his pain is doing much better. His quality of life is  also doing much better area  We'll see what the PSA is.  He is not due for his Lupron or Xgeva until December.   Again, I'm glad that his pain is doing much better in that he has a better quality of life.   I will plan to see him back in 6 weeks.   I spent about 35 minutes  with he and his wife.    [name], MD 10/20/20176:20 PM

## 2015-11-05 LAB — PSA: Prostate Specific Ag, Serum: <0.1 ng/mL (ref 0.0–4.0)

## 2015-11-07 ENCOUNTER — Ambulatory Visit
Admission: RE | Admit: 2015-11-07 | Discharge: 2015-11-07 | Disposition: A | Discharge status: Home / Self Care | Payer: Medicare Other | Source: Ambulatory Visit | Attending: Radiation Oncology | Admitting: Radiation Oncology

## 2015-11-07 ENCOUNTER — Ambulatory Visit: Payer: Medicare Other | Admitting: Radiation Oncology

## 2015-11-07 ENCOUNTER — Ambulatory Visit: Admission: RE | Admit: 2015-11-07 | Payer: Medicare Other | Source: Ambulatory Visit | Admitting: Radiation Oncology

## 2015-11-07 ENCOUNTER — Telehealth: Payer: Self-pay | Admitting: *Deleted

## 2015-11-07 DIAGNOSIS — C61 Malignant neoplasm of prostate: Secondary | ICD-10-CM | POA: Insufficient documentation

## 2015-11-07 DIAGNOSIS — C7951 Secondary malignant neoplasm of bone: Secondary | ICD-10-CM | POA: Diagnosis not present

## 2015-11-07 DIAGNOSIS — Z510 Encounter for antineoplastic radiation therapy: Secondary | ICD-10-CM | POA: Diagnosis not present

## 2015-11-07 NOTE — Telephone Encounter (Signed)
XXXX 

## 2015-11-07 NOTE — Telephone Encounter (Addendum)
Patient aware of results  ----- Message from [name], MD sent at 11/05/2015 10:46 AM EDT ----- Call - PSA is still < 0.1. This is very stable!!  [name]

## 2015-11-08 ENCOUNTER — Ambulatory Visit: Payer: Medicare Other | Admitting: Radiation Oncology

## 2015-11-08 ENCOUNTER — Ambulatory Visit
Admission: RE | Admit: 2015-11-08 | Discharge: 2015-11-08 | Disposition: A | Discharge status: Home / Self Care | Payer: Medicare Other | Source: Ambulatory Visit | Attending: Radiation Oncology | Admitting: Radiation Oncology

## 2015-11-09 ENCOUNTER — Ambulatory Visit
Admission: RE | Admit: 2015-11-09 | Discharge: 2015-11-09 | Disposition: A | Discharge status: Home / Self Care | Payer: Medicare Other | Source: Ambulatory Visit | Attending: Radiation Oncology | Admitting: Radiation Oncology

## 2015-11-09 ENCOUNTER — Encounter: Payer: Self-pay | Admitting: Radiation Oncology

## 2015-11-09 DIAGNOSIS — C7951 Secondary malignant neoplasm of bone: Secondary | ICD-10-CM | POA: Diagnosis not present

## 2015-11-09 DIAGNOSIS — C61 Malignant neoplasm of prostate: Secondary | ICD-10-CM | POA: Diagnosis not present

## 2015-11-09 DIAGNOSIS — Z510 Encounter for antineoplastic radiation therapy: Secondary | ICD-10-CM | POA: Diagnosis not present

## 2015-11-09 NOTE — Progress Notes (Signed)
  Radiation Oncology         [phone]) [phone] ________________________________  Name: [name] MRN: [id number]  Date: 11/09/2015  DOB: Jul 06, 1945  Stereotactic Body Radiotherapy Treatment Procedure Note  NARRATIVE:  [name] was brought to the stereotactic radiation treatment machine and placed supine on the CT couch. The patient was set up for stereotactic body radiotherapy on the body fix pillow.  3D TREATMENT PLANNING AND DOSIMETRY:  The patient's radiation plan was reviewed and approved prior to starting treatment.  It showed 3-dimensional radiation distributions overlaid onto the planning CT.  [facility] for the target structures as well as the organs at risk were reviewed. The documentation of this is filed in the radiation oncology EMR.  SIMULATION VERIFICATION:  The patient underwent CT imaging on the treatment unit.  These were carefully aligned to document that the ablative radiation dose would cover the target volume and maximally spare the nearby organs at risk according to the planned distribution.  SPECIAL TREATMENT PROCEDURE: [name] received high dose ablative stereotactic body radiotherapy to the planned target volume without unforeseen complications. Treatment was delivered uneventfully. The high doses associated with stereotactic body radiotherapy and the significant potential risks require careful treatment set up and patient monitoring constituting a special treatment procedure   STEREOTACTIC TREATMENT MANAGEMENT:  Following delivery, the patient was evaluated clinically. The patient tolerated treatment without significant acute effects, and was discharged to home in stable condition.    PLAN: Continue treatment as planned.  ________________________________  Blair Promise, PhD, MD

## 2015-11-10 ENCOUNTER — Ambulatory Visit: Payer: Medicare Other | Admitting: Radiation Oncology

## 2015-11-16 MED FILL — EZETIMIBE 10 MG TABLET: 10 | 90 days supply | Qty: 90 | Fill #1

## 2015-11-16 MED FILL — ZYTIGA 250 MG TABLET: 250 | 30 days supply | Qty: 120 | Fill #2

## 2015-11-16 MED FILL — GABAPENTIN 300 MG CAPSULE: 300 | 30 days supply | Qty: 120 | Fill #1

## 2015-11-21 ENCOUNTER — Ambulatory Visit: Payer: Medicare Other | Admitting: Hematology & Oncology

## 2015-11-21 ENCOUNTER — Other Ambulatory Visit: Payer: Medicare Other

## 2015-11-23 ENCOUNTER — Other Ambulatory Visit: Payer: Self-pay | Admitting: Oncology

## 2015-11-23 ENCOUNTER — Telehealth: Payer: Self-pay | Admitting: *Deleted

## 2015-11-23 DIAGNOSIS — C7951 Secondary malignant neoplasm of bone: Secondary | ICD-10-CM

## 2015-11-23 NOTE — Telephone Encounter (Signed)
CALLED PATIENT TO REMIND OF LAB AND WEIGHT FOR 11-24-15 @  12 PM FOR HIS XOFIGO INJ., SPOKE WITH PATIENT AND HE IS AWARE OF THIS APPT.

## 2015-11-24 ENCOUNTER — Ambulatory Visit
Admission: RE | Admit: 2015-11-24 | Discharge: 2015-11-24 | Disposition: A | Discharge status: Home / Self Care | Payer: Medicare Other | Source: Ambulatory Visit | Attending: Radiation Oncology | Admitting: Radiation Oncology

## 2015-11-24 DIAGNOSIS — C7951 Secondary malignant neoplasm of bone: Secondary | ICD-10-CM | POA: Diagnosis not present

## 2015-11-24 LAB — CBC WITH DIFFERENTIAL/PLATELET
BASO%: 0.5 % (ref 0.0–2.0)
Basophils Absolute: 0 10*3/uL (ref 0.0–0.1)
EOS%: 0.7 % (ref 0.0–7.0)
Eosinophils Absolute: 0.1 10*3/uL (ref 0.0–0.5)
HCT: 37.1 % — ABNORMAL LOW (ref 38.4–49.9)
HGB: 12.1 g/dL — ABNORMAL LOW (ref 13.0–17.1)
LYMPH%: 12.1 % — ABNORMAL LOW (ref 14.0–49.0)
MCH: 31.1 pg (ref 27.2–33.4)
MCHC: 32.7 g/dL (ref 32.0–36.0)
MCV: 95 fL (ref 79.3–98.0)
MONO#: 0.5 10*3/uL (ref 0.1–0.9)
MONO%: 7 % (ref 0.0–14.0)
NEUT#: 6 10*3/uL (ref 1.5–6.5)
NEUT%: 79.7 % — ABNORMAL HIGH (ref 39.0–75.0)
Platelets: 230 10*3/uL (ref 140–400)
RBC: 3.91 10*6/uL — ABNORMAL LOW (ref 4.20–5.82)
RDW: 14.2 % (ref 11.0–14.6)
WBC: 7.5 10*3/uL (ref 4.0–10.3)
lymph#: 0.9 10*3/uL (ref 0.9–3.3)

## 2015-11-24 NOTE — Progress Notes (Signed)
  Radiation Oncology         [phone] ________________________________  Name: [name] MRN: [id number]  Date: 11/09/2015  DOB: 16-Jan-1946  End of Treatment Note  Diagnosis:   Metastatic castrate resistant prostate cancer     Indication for treatment:  Palliative     Radiation treatment dates:   10/26/15 - 11/09/15  Site/dose:   Right sacro-iliac region: 30 Gy in 5 fractions  Beams/energy:   SBRT/SRT-VMAT // 10FFF Photon  Narrative: The patient tolerated radiation treatment relatively well. Prior to his first SBRT, his HGB has dropped from 12.8 to 11.9. He reported having bright red blood in his stools since 10/19/15. He said it did not happen every time and that the first time it happened it filled the commode. He reported that he did not see any blood with his last bowel movement. He denied fatigue. He continued to report having urinary incontinence.  Pain improved with SB RT  Of note: The patient continues to have infusions of Radium-223. Status post 2 cycles.  Plan: The patient has completed radiation treatment. The patient will return to radiation oncology clinic for routine followup in one month. I advised them to call or return sooner if they have any questions or concerns related to their recovery or treatment.  -----------------------------------  Blair Promise, PhD, MD  This document serves as a record of services personally performed by [name], MD. It was created on his behalf by Darcus Austin, a trained medical scribe. The creation of this record is based on the scribe's personal observations and the provider's statements to them. This document has been checked and approved by the attending provider.

## 2015-11-30 ENCOUNTER — Telehealth: Payer: Self-pay | Admitting: *Deleted

## 2015-11-30 NOTE — Telephone Encounter (Signed)
Called patient to remind of Xofigo Inj. On 12-01-15- report time for inj. @ 11:45 am @ [facility], lvm for a return call

## 2015-12-01 ENCOUNTER — Ambulatory Visit (HOSPITAL_COMMUNITY)
Admission: RE | Admit: 2015-12-01 | Discharge: 2015-12-01 | Disposition: A | Discharge status: Home / Self Care | Payer: Medicare Other | Source: Ambulatory Visit | Attending: Radiation Oncology | Admitting: Radiation Oncology

## 2015-12-01 DIAGNOSIS — C61 Malignant neoplasm of prostate: Secondary | ICD-10-CM | POA: Diagnosis not present

## 2015-12-01 DIAGNOSIS — C7951 Secondary malignant neoplasm of bone: Secondary | ICD-10-CM | POA: Insufficient documentation

## 2015-12-02 ENCOUNTER — Other Ambulatory Visit: Payer: Self-pay | Admitting: Radiation Oncology

## 2015-12-02 ENCOUNTER — Telehealth: Payer: Self-pay | Admitting: *Deleted

## 2015-12-02 DIAGNOSIS — C61 Malignant neoplasm of prostate: Secondary | ICD-10-CM

## 2015-12-02 DIAGNOSIS — C7951 Secondary malignant neoplasm of bone: Principal | ICD-10-CM

## 2015-12-02 NOTE — Telephone Encounter (Signed)
CALLED PATIENT TO INFORM OF LAB AND WEIGHT ON 12-29-15 @ 12 PM AND HIS [place]. ON 01-05-16 @ 12 PM @ [facility], SPOKE WITH PATIENT AND HE IS AWARE OF THESE APPTS.

## 2015-12-16 ENCOUNTER — Ambulatory Visit (HOSPITAL_BASED_OUTPATIENT_CLINIC_OR_DEPARTMENT_OTHER): Payer: Medicare Other

## 2015-12-16 ENCOUNTER — Other Ambulatory Visit (HOSPITAL_BASED_OUTPATIENT_CLINIC_OR_DEPARTMENT_OTHER): Payer: Medicare Other

## 2015-12-16 ENCOUNTER — Ambulatory Visit (HOSPITAL_BASED_OUTPATIENT_CLINIC_OR_DEPARTMENT_OTHER): Payer: Medicare Other | Admitting: Hematology & Oncology

## 2015-12-16 VITALS — BP 108/71 | HR 76 | Temp 97.8°F | Resp 20 | Wt 155.1 lb

## 2015-12-16 DIAGNOSIS — C61 Malignant neoplasm of prostate: Secondary | ICD-10-CM

## 2015-12-16 DIAGNOSIS — Z5111 Encounter for antineoplastic chemotherapy: Secondary | ICD-10-CM

## 2015-12-16 DIAGNOSIS — G893 Neoplasm related pain (acute) (chronic): Secondary | ICD-10-CM

## 2015-12-16 DIAGNOSIS — C7951 Secondary malignant neoplasm of bone: Secondary | ICD-10-CM

## 2015-12-16 LAB — CMP (CANCER CENTER ONLY)
ALT(SGPT): 14 U/L (ref 10–47)
AST: 25 U/L (ref 11–38)
Albumin: 3.6 g/dL (ref 3.3–5.5)
Alkaline Phosphatase: 62 U/L (ref 26–84)
BUN, Bld: 13 mg/dL (ref 7–22)
CO2: 29 mEq/L (ref 18–33)
Calcium: 9.5 mg/dL (ref 8.0–10.3)
Chloride: 103 mEq/L (ref 98–108)
Creat: 1.4 mg/dl — ABNORMAL HIGH (ref 0.6–1.2)
Glucose, Bld: 94 mg/dL (ref 73–118)
Potassium: 3.8 mEq/L (ref 3.3–4.7)
Sodium: 143 mEq/L (ref 128–145)
Total Bilirubin: 0.5 mg/dl (ref 0.20–1.60)
Total Protein: 6.7 g/dL (ref 6.4–8.1)

## 2015-12-16 LAB — CBC WITH DIFFERENTIAL (CANCER CENTER ONLY)
BASO#: 0 10*3/uL (ref 0.0–0.2)
BASO%: 0.3 % (ref 0.0–2.0)
EOS%: 3.1 % (ref 0.0–7.0)
Eosinophils Absolute: 0.2 10*3/uL (ref 0.0–0.5)
HCT: 36.6 % — ABNORMAL LOW (ref 38.7–49.9)
HGB: 12.1 g/dL — ABNORMAL LOW (ref 13.0–17.1)
LYMPH#: 0.8 10*3/uL — ABNORMAL LOW (ref 0.9–3.3)
LYMPH%: 12.9 % — ABNORMAL LOW (ref 14.0–48.0)
MCH: 31.8 pg (ref 28.0–33.4)
MCHC: 33.1 g/dL (ref 32.0–35.9)
MCV: 96 fL (ref 82–98)
MONO#: 0.5 10*3/uL (ref 0.1–0.9)
MONO%: 8.3 % (ref 0.0–13.0)
NEUT#: 4.6 10*3/uL (ref 1.5–6.5)
NEUT%: 75.4 % (ref 40.0–80.0)
Platelets: 223 10*3/uL (ref 145–400)
RBC: 3.81 10*6/uL — ABNORMAL LOW (ref 4.20–5.70)
RDW: 13.2 % (ref 11.1–15.7)
WBC: 6 10*3/uL (ref 4.0–10.0)

## 2015-12-16 MED ORDER — ABIRATERONE ACETATE 250 MG PO TABS: ORAL_TABLET | ORAL | 3 refills | Status: DC

## 2015-12-16 MED ORDER — LEUPROLIDE ACETATE (3 MONTH) 22.5 MG IM KIT
22.5000 mg | PACK | Freq: Once | INTRAMUSCULAR | Status: AC
  Administered 2015-12-16: 22.5 mg via INTRAMUSCULAR
  Filled 2015-12-16: qty 22.5

## 2015-12-16 MED ORDER — DENOSUMAB 120 MG/1.7ML ~~LOC~~ SOLN
120.0000 mg | Freq: Once | SUBCUTANEOUS | Status: AC
  Administered 2015-12-16: 120 mg via SUBCUTANEOUS
  Filled 2015-12-16: qty 1.7

## 2015-12-16 MED FILL — GABAPENTIN 300 MG CAPSULE: 300 | 30 days supply | Qty: 120 | Fill #2

## 2015-12-16 MED FILL — ZYTIGA 250 MG TABLET: 250 | 30 days supply | Qty: 120 | Fill #3

## 2015-12-16 NOTE — Patient Instructions (Signed)
Leuprolide depot injection What is this medicine? LEUPROLIDE (loo PROE lide) is a man-made protein that acts like a natural hormone in the body. It decreases testosterone in men and decreases estrogen in women. In men, this medicine is used to treat advanced prostate cancer. In women, some forms of this medicine may be used to treat endometriosis, uterine fibroids, or other male hormone-related problems. This medicine may be used for other purposes; ask your health care provider or pharmacist if you have questions. COMMON BRAND NAME(S): Eligard, Lupron Depot, Lupron Depot-Ped, Viadur What should I tell my health care provider before I take this medicine? They need to know if you have any of these conditions: -diabetes -heart disease or previous heart attack -high blood pressure -high cholesterol -mental illness -osteoporosis -pain or difficulty passing urine -seizures -spinal cord metastasis -stroke -suicidal thoughts, plans, or attempt; a previous suicide attempt by you or a family member -tobacco smoker -unusual vaginal bleeding (women) -an unusual or allergic reaction to leuprolide, benzyl alcohol, other medicines, foods, dyes, or preservatives -pregnant or trying to get pregnant -breast-feeding How should I use this medicine? This medicine is for injection into a muscle or for injection under the skin. It is given by a health care professional in a hospital or clinic setting. The specific product will determine how it will be given to you. Make sure you understand which product you receive and how often you will receive it. Talk to your pediatrician regarding the use of this medicine in children. Special care may be needed. Overdosage: If you think you have taken too much of this medicine contact a poison control center or emergency room at once. NOTE: This medicine is only for you. Do not share this medicine with others. What if I miss a dose? It is important not to miss a dose.  Call your doctor or health care professional if you are unable to keep an appointment. Depot injections: Depot injections are given either once-monthly, every 12 weeks, every 16 weeks, or every 24 weeks depending on the product you are prescribed. The product you are prescribed will be based on if you are male or male, and your condition. Make sure you understand your product and dosing. What may interact with this medicine? Do not take this medicine with any of the following medications: -chasteberry This medicine may also interact with the following medications: -herbal or dietary supplements, like black cohosh or DHEA -male hormones, like estrogens or progestins and birth control pills, patches, rings, or injections -male hormones, like testosterone This list may not describe all possible interactions. Give your health care provider a list of all the medicines, herbs, non-prescription drugs, or dietary supplements you use. Also tell them if you smoke, drink alcohol, or use illegal drugs. Some items may interact with your medicine. What should I watch for while using this medicine? Visit your doctor or health care professional for regular checks on your progress. During the first weeks of treatment, your symptoms may get worse, but then will improve as you continue your treatment. You may get hot flashes, increased bone pain, increased difficulty passing urine, or an aggravation of nerve symptoms. Discuss these effects with your doctor or health care professional, some of them may improve with continued use of this medicine. Male patients may experience a menstrual cycle or spotting during the first months of therapy with this medicine. If this continues, contact your doctor or health care professional. What side effects may I notice from receiving this medicine? Side   effects that you should report to your doctor or health care professional as soon as possible: -allergic reactions like skin  rash, itching or hives, swelling of the face, lips, or tongue -breathing problems -chest pain -depression or memory disorders -pain in your legs or groin -pain at site where injected or implanted -severe headache -swelling of the feet and legs -visual changes -vomiting Side effects that usually do not require medical attention (report to your doctor or health care professional if they continue or are bothersome): -breast swelling or tenderness -decrease in sex drive or performance -diarrhea -hot flashes -loss of appetite -muscle, joint, or bone pains -nausea -redness or irritation at site where injected or implanted -skin problems or acne This list may not describe all possible side effects. Call your doctor for medical advice about side effects. You may report side effects to FDA at 1-800-FDA-1088. Where should I keep my medicine? This drug is given in a hospital or clinic and will not be stored at home. NOTE: This sheet is a summary. It may not cover all possible information. If you have questions about this medicine, talk to your doctor, pharmacist, or health care provider.  2017 Elsevier/Gold Standard (2015-06-16 09:45:17) Denosumab injection What is this medicine? DENOSUMAB (den oh sue mab) slows bone breakdown. Prolia is used to treat osteoporosis in women after menopause and in men. Delton See is used to prevent bone fractures and other bone problems caused by cancer bone metastases. Delton See is also used to treat giant cell tumor of the bone. COMMON BRAND NAME(S): Prolia, XGEVA What should I tell my health care provider before I take this medicine? They need to know if you have any of these conditions: -dental disease -eczema -infection or history of infections -kidney disease or on dialysis -low blood calcium or vitamin D -malabsorption syndrome -scheduled to have surgery or tooth extraction -taking medicine that contains denosumab -thyroid or parathyroid disease -an unusual  reaction to denosumab, other medicines, foods, dyes, or preservatives -pregnant or trying to get pregnant -breast-feeding How should I use this medicine? This medicine is for injection under the skin. It is given by a health care professional in a hospital or clinic setting. If you are getting Prolia, a special MedGuide will be given to you by the pharmacist with each prescription and refill. Be sure to read this information carefully each time. For Prolia, talk to your pediatrician regarding the use of this medicine in children. Special care may be needed. For Delton See, talk to your pediatrician regarding the use of this medicine in children. While this drug may be prescribed for children as young as 13 years for selected conditions, precautions do apply. What if I miss a dose? It is important not to miss your dose. Call your doctor or health care professional if you are unable to keep an appointment. What may interact with this medicine? Do not take this medicine with any of the following medications: -other medicines containing denosumab This medicine may also interact with the following medications: -medicines that suppress the immune system -medicines that treat cancer -steroid medicines like prednisone or cortisone What should I watch for while using this medicine? Visit your doctor or health care professional for regular checks on your progress. Your doctor or health care professional may order blood tests and other tests to see how you are doing. Call your doctor or health care professional if you get a cold or other infection while receiving this medicine. Do not treat yourself. This medicine may decrease your  body's ability to fight infection. You should make sure you get enough calcium and vitamin D while you are taking this medicine, unless your doctor tells you not to. Discuss the foods you eat and the vitamins you take with your health care professional. See your dentist regularly.  Brush and floss your teeth as directed. Before you have any dental work done, tell your dentist you are receiving this medicine. Do not become pregnant while taking this medicine or for 5 months after stopping it. Women should inform their doctor if they wish to become pregnant or think they might be pregnant. There is a potential for serious side effects to an unborn child. Talk to your health care professional or pharmacist for more information. What side effects may I notice from receiving this medicine? Side effects that you should report to your doctor or health care professional as soon as possible: -allergic reactions like skin rash, itching or hives, swelling of the face, lips, or tongue -breathing problems -chest pain -fast, irregular heartbeat -feeling faint or lightheaded, falls -fever, chills, or any other sign of infection -muscle spasms, tightening, or twitches -numbness or tingling -skin blisters or bumps, or is dry, peels, or red -slow healing or unexplained pain in the mouth or jaw -unusual bleeding or bruising Side effects that usually do not require medical attention (report to your doctor or health care professional if they continue or are bothersome): -muscle pain -stomach upset, gas Where should I keep my medicine? This medicine is only given in a clinic, doctor's office, or other health care setting and will not be stored at home.  2017 Elsevier/Gold Standard (2015-02-03 10:06:55)

## 2015-12-16 NOTE — Progress Notes (Signed)
Hematology and Oncology Follow Up Visit  [name] [id number] [birth year] 70 y.o. 12/16/2015   Principle Diagnosis:   Metastatic castrate resistant prostate cancer  Current Therapy:    Zytiga 1000 mg by mouth daily  Xgeva 120 mg subcutaneous Q3 months - due in  March  Lupron 22 mg IM every 3 months - due in March  Radium-223 therapy -- s/p cycle #2  Palliative radiation to the right sacrum     Interim History:  [name] is back for followup. He looks much better. He feels much better. He's had no problems with the Xofigo. He has had 3 doses to date. His next dose will be in mid December.  Radiation therapy that he had to his sacrum really has helped. He has absolutely no pain. He's when not taking any pain medication. He is working. He's had no issues with discomfort.  He's had a problem with the Zytiga. He's had no problems with nausea or vomiting. He's had no diarrhea. He's had no cough. He's had no chest discomfort.  His last PSA was less than 0.1. His PSA typically has been on the very low side.  He does have diabetes. He is watching his blood sugars. His wife is doing great job monitoring his intake.   He has had no problems with rashes.   Overall, his performance status is ECOG 0.    Medications:  Current Outpatient Prescriptions:  .  abiraterone Acetate (ZYTIGA) 250 MG tablet, TAKE 4 TABLETS BY MOUTH DAILY. TAKE ON AN EMPTY STOMACH 1 HOUR BEFORE OR 2 HOURS AFTER A MEAL, Disp: 120 tablet, Rfl: 3 .  acetaminophen (TYLENOL) 500 MG tablet, Take 1,000 mg by mouth every 4 (four) hours as needed for moderate pain., Disp: , Rfl:  .  aspirin 81 MG tablet, Take 81 mg by mouth daily., Disp: , Rfl:  .  Calcium Carbonate-Vitamin D (CALCIUM + D PO), Take 770 mg by mouth 2 (two) times daily. , Disp: , Rfl:  .  gabapentin (NEURONTIN) 300 MG capsule, Take 1 pill twice a day, then take 2 at bedtime, Disp: 120 capsule, Rfl: 4 .  ipratropium (ATROVENT) 0.06 % nasal spray, Place 1  spray into both nostrils 4 (four) times daily., Disp: , Rfl:  .  isosorbide mononitrate (IMDUR) 30 MG 24 hr tablet, Take 30 mg by mouth daily., Disp: , Rfl:  .  metFORMIN (GLUCOPHAGE-XR) 500 MG 24 hr tablet, Take 2 tablets (1,000 mg total) by mouth daily with breakfast., Disp: 180 tablet, Rfl: 2 .  OVER THE COUNTER MEDICATION, every morning. Juice Plus -- 8 capsule of each the garden, vineyard, and orchead, Disp: , Rfl:  .  predniSONE (DELTASONE) 5 MG tablet, TAKE 1 TABLET BY MOUTH DAILY, Disp: 60 tablet, Rfl: 3 .  ZETIA 10 MG tablet, 10 mg daily., Disp: , Rfl:  .  HYDROmorphone (DILAUDID) 4 MG tablet, Take 1 tablet (4 mg total) by mouth every 6 (six) hours as needed for severe pain. (Patient not taking: Reported on 12/16/2015), Disp: 20 tablet, Rfl: 0 .  ondansetron (ZOFRAN ODT) 4 MG disintegrating tablet, Take 1 tablet (4 mg total) by mouth every 8 (eight) hours as needed for nausea or vomiting. (Patient not taking: Reported on 12/16/2015), Disp: 12 tablet, Rfl: 2 .  prochlorperazine (COMPAZINE) 10 MG tablet, Take 1 tablet (10 mg total) by mouth every 6 (six) hours as needed for nausea or vomiting. (Patient not taking: Reported on 12/16/2015), Disp: 30 tablet, Rfl: 0 .  traMADol (ULTRAM) 50 MG tablet, Take 1 tablet (50 mg total) by mouth every 6 (six) hours as needed. (Patient not taking: Reported on 12/16/2015), Disp: 90 tablet, Rfl: 2 No current facility-administered medications for this visit.   Facility-Administered Medications Ordered in Other Visits:  .  denosumab (XGEVA) injection 120 mg, 120 mg, Subcutaneous, Once, [name], MD .  leuprolide (LUPRON) injection 22.5 mg, 22.5 mg, Intramuscular, Once, [name], MD  Allergies:  Allergies  Allergen Reactions  . Codeine Nausea And Vomiting  . Hydrocodone Nausea And Vomiting    Past Medical History, Surgical history, Social history, and Family History were reviewed and updated.  Review of Systems: As above  Physical Exam:   weight is 155 lb 1.9 oz (70.4 kg). His oral temperature is 97.8 F (36.6 C). His blood pressure is 108/71 and his pulse is 76. His respiration is 20.   Well-developed and well-nourished white male. Head and neck exam shows no ocular or oral lesions. The are no palpable cervical or supraclavicular lymph nodes. Lungs are clear with no rales, wheezes or rhonchi. Cardiac exam regular rate and rhythm with no murmurs rubs or bruits. Abdomen is soft. He has good bowel sounds. There is no fluid wave. There is no palpable liver or spleen tip. Back exam shows no tenderness over the spine ribs or hips. I cannot elicit any tenderness over the sacrum bilaterally. He has good range of motion of his pelvis. Extremities shows no clubbing, cyanosis or edema. Skin exam shows areas of urticaria on the left forearm.. There is also an area as solitary in the upper chest wall in the upper sternal region. These are nontender. There are erythematous with a central area of vesicle. No erythema to the skin is noted. Neurological exam is nonfocal.   Lab Results  Component Value Date   WBC 6.0 12/16/2015   HGB 12.1 (L) 12/16/2015   HCT 36.6 (L) 12/16/2015   MCV 96 12/16/2015   PLT 223 12/16/2015     Chemistry      Component Value Date/Time   NA 141 11/04/2015 0930   NA 141 10/14/2015 0803   K 4.2 11/04/2015 0930   K 4.2 10/14/2015 0803   CL 104 11/04/2015 0930   CO2 29 11/04/2015 0930   CO2 27 10/14/2015 0803   BUN 18 11/04/2015 0930   BUN 17.9 10/14/2015 0803   CREATININE 1.2 11/04/2015 0930   CREATININE 1.2 10/14/2015 0803      Component Value Date/Time   CALCIUM 9.6 11/04/2015 0930   CALCIUM 10.0 10/14/2015 0803   ALKPHOS 63 11/04/2015 0930   ALKPHOS 78 10/14/2015 0803   AST 22 11/04/2015 0930   AST 17 10/14/2015 0803   ALT 24 11/04/2015 0930   ALT 12 10/14/2015 0803   BILITOT 0.50 11/04/2015 0930   BILITOT 0.45 10/14/2015 0803         Impression and Plan: Mr. Stemm is 70 year old gentleman  with metastatic prostate cancer.   It is nice to see that his pain is doing much better. His quality of life is also doing much better area  I will go ahead and give him the Xgeva and Lupron today.  From my point of view, I don't see that we have to do any scans on him right now. I don't think that the scans we will give [place] any helpful information. If he starts to develop any, symptom, then scans would be helpful.  We will see him back in 6  weeks. I like to see him every 6 weeks.   I spent about 35 minutes with he and his wife.    [name], MD 12/1/20171:50 PM

## 2015-12-17 LAB — PSA: Prostate Specific Ag, Serum: <0.1 ng/mL (ref 0.0–4.0)

## 2015-12-17 LAB — TESTOSTERONE: Testosterone, Serum: <3 ng/dL — ABNORMAL LOW (ref 264–916)

## 2015-12-19 ENCOUNTER — Encounter: Payer: Self-pay | Admitting: *Deleted

## 2015-12-20 ENCOUNTER — Other Ambulatory Visit: Payer: Self-pay | Admitting: Hematology & Oncology

## 2015-12-20 MED FILL — predniSONE 5 MG TABS: 5 | 60 days supply | Qty: 60 | Fill #0

## 2015-12-28 ENCOUNTER — Telehealth: Payer: Self-pay | Admitting: *Deleted

## 2015-12-28 NOTE — Telephone Encounter (Signed)
Called patient to remind of lab and weight on 12-29-15 @ 12 pm, spoke with patient and he is aware of this appt.

## 2015-12-29 ENCOUNTER — Other Ambulatory Visit: Payer: Self-pay | Admitting: Oncology

## 2015-12-29 ENCOUNTER — Ambulatory Visit
Admission: RE | Admit: 2015-12-29 | Discharge: 2015-12-29 | Disposition: A | Discharge status: Home / Self Care | Payer: Medicare Other | Source: Ambulatory Visit | Attending: Radiation Oncology | Admitting: Radiation Oncology

## 2015-12-29 ENCOUNTER — Encounter: Payer: Self-pay | Admitting: Radiation Oncology

## 2015-12-29 DIAGNOSIS — C7951 Secondary malignant neoplasm of bone: Secondary | ICD-10-CM

## 2015-12-29 LAB — CBC WITH DIFFERENTIAL/PLATELET
BASO%: 0.3 % (ref 0.0–2.0)
Basophils Absolute: 0 10*3/uL (ref 0.0–0.1)
EOS%: 0.7 % (ref 0.0–7.0)
Eosinophils Absolute: 0.1 10*3/uL (ref 0.0–0.5)
HCT: 37.6 % — ABNORMAL LOW (ref 38.4–49.9)
HGB: 12.4 g/dL — ABNORMAL LOW (ref 13.0–17.1)
LYMPH%: 18 % (ref 14.0–49.0)
MCH: 31.1 pg (ref 27.2–33.4)
MCHC: 33 g/dL (ref 32.0–36.0)
MCV: 94.2 fL (ref 79.3–98.0)
MONO#: 0.6 10*3/uL (ref 0.1–0.9)
MONO%: 9 % (ref 0.0–14.0)
NEUT#: 5.2 10*3/uL (ref 1.5–6.5)
NEUT%: 72 % (ref 39.0–75.0)
Platelets: 241 10*3/uL (ref 140–400)
RBC: 3.99 10*6/uL — ABNORMAL LOW (ref 4.20–5.82)
RDW: 13.3 % (ref 11.0–14.6)
WBC: 7.2 10*3/uL (ref 4.0–10.3)
lymph#: 1.3 10*3/uL (ref 0.9–3.3)

## 2016-01-02 MED FILL — METFORMIN HCL ER 500 MG TAB: 500 | 90 days supply | Qty: 180 | Fill #1

## 2016-01-02 MED FILL — ISOSORBIDE MN ER 30 MG TAB: 30 | 90 days supply | Qty: 90 | Fill #2

## 2016-01-04 ENCOUNTER — Telehealth: Payer: Self-pay | Admitting: *Deleted

## 2016-01-04 NOTE — Telephone Encounter (Signed)
Called patient to remind of Xofigo Inj. For 01-05-16- arrival time - 11:45 am @ [facility], spoke with patient and he is aware of this inj.

## 2016-01-05 ENCOUNTER — Ambulatory Visit (HOSPITAL_COMMUNITY)
Admission: RE | Admit: 2016-01-05 | Discharge: 2016-01-05 | Disposition: A | Discharge status: Home / Self Care | Payer: Medicare Other | Source: Ambulatory Visit | Attending: Radiation Oncology | Admitting: Radiation Oncology

## 2016-01-05 DIAGNOSIS — C61 Malignant neoplasm of prostate: Secondary | ICD-10-CM | POA: Diagnosis not present

## 2016-01-05 DIAGNOSIS — C7951 Secondary malignant neoplasm of bone: Secondary | ICD-10-CM | POA: Diagnosis present

## 2016-01-10 ENCOUNTER — Telehealth: Payer: Self-pay | Admitting: *Deleted

## 2016-01-10 ENCOUNTER — Other Ambulatory Visit (HOSPITAL_COMMUNITY): Payer: Self-pay | Admitting: Urology

## 2016-01-10 DIAGNOSIS — C61 Malignant neoplasm of prostate: Secondary | ICD-10-CM

## 2016-01-10 MED FILL — GABAPENTIN 300 MG CAPSULE: 300 | 30 days supply | Qty: 120 | Fill #3

## 2016-01-10 NOTE — Telephone Encounter (Signed)
CALLED PATIENT TO INFORM OF LAB AND WEIGHT ON 02-02-16 @ 12 PM AND HIS [place]. ON 02-09-16 @ 12:30 PM @ [facility], SPOKE WITH PATIENT AND HE IS AWARE OF THESE APPTS.

## 2016-01-12 MED FILL — ZYTIGA 250 MG TABLET: 250 | 30 days supply | Qty: 120 | Fill #0

## 2016-01-27 ENCOUNTER — Ambulatory Visit (HOSPITAL_BASED_OUTPATIENT_CLINIC_OR_DEPARTMENT_OTHER): Payer: Medicare Other | Admitting: Hematology & Oncology

## 2016-01-27 ENCOUNTER — Other Ambulatory Visit (HOSPITAL_BASED_OUTPATIENT_CLINIC_OR_DEPARTMENT_OTHER): Payer: Medicare Other

## 2016-01-27 VITALS — BP 104/64 | HR 74 | Temp 98.6°F | Resp 18 | Wt 157.8 lb

## 2016-01-27 DIAGNOSIS — C7951 Secondary malignant neoplasm of bone: Secondary | ICD-10-CM

## 2016-01-27 DIAGNOSIS — C61 Malignant neoplasm of prostate: Secondary | ICD-10-CM

## 2016-01-27 LAB — CBC WITH DIFFERENTIAL (CANCER CENTER ONLY)
BASO#: 0 10*3/uL (ref 0.0–0.2)
BASO%: 0.4 % (ref 0.0–2.0)
EOS%: 2.6 % (ref 0.0–7.0)
Eosinophils Absolute: 0.2 10*3/uL (ref 0.0–0.5)
HCT: 36.5 % — ABNORMAL LOW (ref 38.7–49.9)
HGB: 11.8 g/dL — ABNORMAL LOW (ref 13.0–17.1)
LYMPH#: 1 10*3/uL (ref 0.9–3.3)
LYMPH%: 13.5 % — ABNORMAL LOW (ref 14.0–48.0)
MCH: 31.3 pg (ref 28.0–33.4)
MCHC: 32.3 g/dL (ref 32.0–35.9)
MCV: 97 fL (ref 82–98)
MONO#: 0.5 10*3/uL (ref 0.1–0.9)
MONO%: 6.4 % (ref 0.0–13.0)
NEUT#: 5.7 10*3/uL (ref 1.5–6.5)
NEUT%: 77.1 % (ref 40.0–80.0)
Platelets: 219 10*3/uL (ref 145–400)
RBC: 3.77 10*6/uL — ABNORMAL LOW (ref 4.20–5.70)
RDW: 13.2 % (ref 11.1–15.7)
WBC: 7.4 10*3/uL (ref 4.0–10.0)

## 2016-01-27 LAB — CMP (CANCER CENTER ONLY)
ALT(SGPT): 16 U/L (ref 10–47)
AST: 20 U/L (ref 11–38)
Albumin: 3.6 g/dL (ref 3.3–5.5)
Alkaline Phosphatase: 63 U/L (ref 26–84)
BUN, Bld: 17 mg/dL (ref 7–22)
CO2: 29 mEq/L (ref 18–33)
Calcium: 9.8 mg/dL (ref 8.0–10.3)
Chloride: 103 mEq/L (ref 98–108)
Creat: 1.3 mg/dl — ABNORMAL HIGH (ref 0.6–1.2)
Glucose, Bld: 112 mg/dL (ref 73–118)
Potassium: 4.4 mEq/L (ref 3.3–4.7)
Sodium: 141 mEq/L (ref 128–145)
Total Bilirubin: 0.5 mg/dl (ref 0.20–1.60)
Total Protein: 6.6 g/dL (ref 6.4–8.1)

## 2016-01-27 NOTE — Progress Notes (Signed)
Hematology and Oncology Follow Up Visit  Dream Nodal [id number] 10-05-1945 71 y.o. 01/27/2016   Principle Diagnosis:   Metastatic castrate resistant prostate cancer  Current Therapy:    Zytiga 1000 mg by mouth daily  Xgeva 120 mg subcutaneous Q3 months - due in  March  Lupron 22 mg IM every 3 months - due in March  Radium-223 therapy -- s/p cycle #4  Palliative radiation to the right sacrum     Interim History:  Mr.  Hassell is back for followup. He looks much better. He feels much better. He really had a nice Christmas and New Year's. His birthday was right after Christmas. He had a lot of family over for [place] to send his birthday.   He's had no problems with the Xofigo. He has had 4 doses to date. His next dose will be in mid January.  Radiation therapy that he had to his sacrum really has helped. He has absolutely no pain. He's when not taking any pain medication. He is working. He's had no issues with discomfort.  He's had a problem with the Zytiga. He's had no problems with nausea or vomiting. He's had no diarrhea. He's had no cough. He's had no chest discomfort.  His last PSA was less than 0.1. His PSA typically has been on the very low side.  He does have diabetes. He is watching his blood sugars. He is on metformin now. His wife is doing great job monitoring his intake.   He has had no problems with rashes.   Overall, his performance status is ECOG 0.    Medications:  Current Outpatient Prescriptions:  .  abiraterone Acetate (ZYTIGA) 250 MG tablet, TAKE 4 TABLETS BY MOUTH DAILY. TAKE ON AN EMPTY STOMACH 1 HOUR BEFORE OR 2 HOURS AFTER A MEAL, Disp: 120 tablet, Rfl: 3 .  acetaminophen (TYLENOL) 500 MG tablet, Take 1,000 mg by mouth every 4 (four) hours as needed for moderate pain., Disp: , Rfl:  .  aspirin 81 MG tablet, Take 81 mg by mouth daily., Disp: , Rfl:  .  Calcium Carbonate-Vitamin D (CALCIUM + D PO), Take 770 mg by mouth 2 (two) times daily. , Disp: , Rfl:    .  gabapentin (NEURONTIN) 300 MG capsule, Take 1 pill twice a day, then take 2 at bedtime, Disp: 120 capsule, Rfl: 4 .  HYDROmorphone (DILAUDID) 4 MG tablet, Take 1 tablet (4 mg total) by mouth every 6 (six) hours as needed for severe pain. (Patient not taking: Reported on 12/16/2015), Disp: 20 tablet, Rfl: 0 .  ipratropium (ATROVENT) 0.06 % nasal spray, Place 1 spray into both nostrils 4 (four) times daily., Disp: , Rfl:  .  isosorbide mononitrate (IMDUR) 30 MG 24 hr tablet, Take 30 mg by mouth daily., Disp: , Rfl:  .  metFORMIN (GLUCOPHAGE-XR) 500 MG 24 hr tablet, Take 2 tablets (1,000 mg total) by mouth daily with breakfast., Disp: 180 tablet, Rfl: 2 .  ondansetron (ZOFRAN ODT) 4 MG disintegrating tablet, Take 1 tablet (4 mg total) by mouth every 8 (eight) hours as needed for nausea or vomiting. (Patient not taking: Reported on 12/16/2015), Disp: 12 tablet, Rfl: 2 .  OVER THE COUNTER MEDICATION, every morning. Juice Plus -- 8 capsule of each the garden, vineyard, and orchead, Disp: , Rfl:  .  predniSONE (DELTASONE) 5 MG tablet, TAKE 1 TABLET BY MOUTH DAILY, Disp: 60 tablet, Rfl: 3 .  prochlorperazine (COMPAZINE) 10 MG tablet, Take 1 tablet (10 mg total) by mouth  every 6 (six) hours as needed for nausea or vomiting. (Patient not taking: Reported on 12/16/2015), Disp: 30 tablet, Rfl: 0 .  traMADol (ULTRAM) 50 MG tablet, Take 1 tablet (50 mg total) by mouth every 6 (six) hours as needed. (Patient not taking: Reported on 12/16/2015), Disp: 90 tablet, Rfl: 2 .  ZETIA 10 MG tablet, 10 mg daily., Disp: , Rfl:   Allergies:  Allergies  Allergen Reactions  . Codeine Nausea And Vomiting  . Hydrocodone Nausea And Vomiting    Past Medical History, Surgical history, Social history, and Family History were reviewed and updated.  Review of Systems: As above  Physical Exam:  weight is 157 lb 12.8 oz (71.6 kg). His oral temperature is 98.6 F (37 C). His blood pressure is 104/64 and his pulse is 74. His  respiration is 18 and oxygen saturation is 100%.   Well-developed and well-nourished white male. Head and neck exam shows no ocular or oral lesions. The are no palpable cervical or supraclavicular lymph nodes. Lungs are clear with no rales, wheezes or rhonchi. Cardiac exam regular rate and rhythm with no murmurs rubs or bruits. Abdomen is soft. He has good bowel sounds. There is no fluid wave. There is no palpable liver or spleen tip. Back exam shows no tenderness over the spine ribs or hips. I cannot elicit any tenderness over the sacrum bilaterally. He has good range of motion of his pelvis. Extremities shows no clubbing, cyanosis or edema. Skin exam shows areas of urticaria on the left forearm.. There is also an area as solitary in the upper chest wall in the upper sternal region. These are nontender. There are erythematous with a central area of vesicle. No erythema to the skin is noted. Neurological exam is nonfocal.   Lab Results  Component Value Date   WBC 7.4 01/27/2016   HGB 11.8 (L) 01/27/2016   HCT 36.5 (L) 01/27/2016   MCV 97 01/27/2016   PLT 219 01/27/2016     Chemistry      Component Value Date/Time   NA 141 01/27/2016 1444   NA 141 10/14/2015 0803   K 4.4 01/27/2016 1444   K 4.2 10/14/2015 0803   CL 103 01/27/2016 1444   CO2 29 01/27/2016 1444   CO2 27 10/14/2015 0803   BUN 17 01/27/2016 1444   BUN 17.9 10/14/2015 0803   CREATININE 1.3 (H) 01/27/2016 1444   CREATININE 1.2 10/14/2015 0803      Component Value Date/Time   CALCIUM 9.8 01/27/2016 1444   CALCIUM 10.0 10/14/2015 0803   ALKPHOS 63 01/27/2016 1444   ALKPHOS 78 10/14/2015 0803   AST 20 01/27/2016 1444   AST 17 10/14/2015 0803   ALT 16 01/27/2016 1444   ALT 12 10/14/2015 0803   BILITOT 0.50 01/27/2016 1444   BILITOT 0.45 10/14/2015 0803         Impression and Plan: Mr. Doyon is 71 year old gentleman with metastatic prostate cancer.   It is nice to see that his pain is doing much better. His  quality of life is also doing much better area  From my point of view, I don't see that we have to do any scans on him right now. I don't think that the scans we will give [place] any helpful information. If he starts to develop any, symptom, then scans would be helpful.  We will see him back in 6 weeks. We'll see him back, he will get his Lupron and Xgeva.   I probably would  not do scans on him until sometime in March after he has completed the [place]    I spent about 35 minutes with he and his wife.    [name], MD 1/12/20184:13 PM

## 2016-01-28 LAB — PSA: Prostate Specific Ag, Serum: <0.1 ng/mL (ref 0.0–4.0)

## 2016-01-30 ENCOUNTER — Telehealth: Payer: Self-pay | Admitting: *Deleted

## 2016-01-30 NOTE — Telephone Encounter (Addendum)
Patient is aware of results.   ----- Message from [name], MD sent at 01/30/2016  8:17 AM EST ----- Call = PSA is still not detectable!!  pete

## 2016-02-01 ENCOUNTER — Telehealth: Payer: Self-pay | Admitting: *Deleted

## 2016-02-01 NOTE — Telephone Encounter (Signed)
Called patient to remind of labs and weight for 02-02-16 @ 12 pm, spoke with patient and he is aware of this appt.

## 2016-02-02 ENCOUNTER — Telehealth: Payer: Self-pay | Admitting: *Deleted

## 2016-02-02 ENCOUNTER — Other Ambulatory Visit: Payer: Self-pay | Admitting: Oncology

## 2016-02-02 ENCOUNTER — Ambulatory Visit: Payer: Medicare Other

## 2016-02-02 DIAGNOSIS — C7951 Secondary malignant neoplasm of bone: Secondary | ICD-10-CM

## 2016-02-02 NOTE — Telephone Encounter (Signed)
Called patient to inform that Dr. Sondra Come can use his labs from 01-27-16 drawn @ Dr. Antonieta Pert Office, spoke with patient and he is aware of this.

## 2016-02-06 DIAGNOSIS — L219 Seborrheic dermatitis, unspecified: Secondary | ICD-10-CM | POA: Diagnosis not present

## 2016-02-06 DIAGNOSIS — L578 Other skin changes due to chronic exposure to nonionizing radiation: Secondary | ICD-10-CM | POA: Diagnosis not present

## 2016-02-06 DIAGNOSIS — L570 Actinic keratosis: Secondary | ICD-10-CM | POA: Diagnosis not present

## 2016-02-07 ENCOUNTER — Telehealth: Payer: Self-pay | Admitting: Hematology & Oncology

## 2016-02-07 MED FILL — ZYTIGA 250 MG TABLET: 250 | 30 days supply | Qty: 120 | Fill #1

## 2016-02-08 ENCOUNTER — Telehealth: Payer: Self-pay | Admitting: *Deleted

## 2016-02-08 NOTE — Telephone Encounter (Signed)
CALLED PATIENT TO REMIND OF XOFIGO INJ. FOR 02-09-16 @ 12:30 PM @ [facility], SPOKE WITH PATIENT AND HE IS AWARE OF THIS INJ.

## 2016-02-09 ENCOUNTER — Ambulatory Visit (HOSPITAL_COMMUNITY)
Admission: RE | Admit: 2016-02-09 | Discharge: 2016-02-09 | Disposition: A | Discharge status: Home / Self Care | Payer: Medicare Other | Source: Ambulatory Visit | Attending: Urology | Admitting: Urology

## 2016-02-09 ENCOUNTER — Encounter: Payer: Self-pay | Admitting: Radiation Oncology

## 2016-02-09 DIAGNOSIS — C7951 Secondary malignant neoplasm of bone: Secondary | ICD-10-CM | POA: Diagnosis not present

## 2016-02-09 DIAGNOSIS — C61 Malignant neoplasm of prostate: Secondary | ICD-10-CM | POA: Diagnosis not present

## 2016-02-13 ENCOUNTER — Telehealth: Payer: Self-pay | Admitting: *Deleted

## 2016-02-13 ENCOUNTER — Other Ambulatory Visit: Payer: Self-pay | Admitting: Radiation Oncology

## 2016-02-13 DIAGNOSIS — C61 Malignant neoplasm of prostate: Secondary | ICD-10-CM

## 2016-02-13 DIAGNOSIS — C7951 Secondary malignant neoplasm of bone: Principal | ICD-10-CM

## 2016-02-13 NOTE — Telephone Encounter (Signed)
CALLED PATIENT TO INFORM OF LABS AND WEIGHT ON 02-29-16 @ 12 PM AND HIS [place]. ON 03-07-16 @ 12 PM IN [facility], SPOKE WITH PATIENT AND HE IS AWARE OF THIS INJ.

## 2016-02-21 MED FILL — GABAPENTIN 300 MG CAPSULE: 300 | 30 days supply | Qty: 120 | Fill #4

## 2016-02-21 MED FILL — predniSONE 5 MG TABS: 5 | 60 days supply | Qty: 60 | Fill #1

## 2016-02-22 DIAGNOSIS — E785 Hyperlipidemia, unspecified: Secondary | ICD-10-CM | POA: Diagnosis not present

## 2016-02-22 DIAGNOSIS — J208 Acute bronchitis due to other specified organisms: Secondary | ICD-10-CM | POA: Diagnosis not present

## 2016-02-22 MED FILL — EZETIMIBE 10 MG TABLET: 10 | 90 days supply | Qty: 90 | Fill #0

## 2016-02-22 MED FILL — AZITHROMYCIN 250 MG TABLET: 250 | 5 days supply | Qty: 6 | Fill #0

## 2016-02-28 ENCOUNTER — Telehealth: Payer: Self-pay | Admitting: *Deleted

## 2016-02-28 NOTE — Telephone Encounter (Signed)
CALLED PATIENT TO REMIND OF LAB AND WEIGHT FOR 02-29-16 @ 12 PM, SPOKE WITH PATIENT AND HE IS AWARE OF THIS APPT.

## 2016-02-29 ENCOUNTER — Ambulatory Visit
Admission: RE | Admit: 2016-02-29 | Discharge: 2016-02-29 | Disposition: A | Discharge status: Home / Self Care | Payer: Medicare Other | Source: Ambulatory Visit | Attending: Radiation Oncology | Admitting: Radiation Oncology

## 2016-02-29 ENCOUNTER — Other Ambulatory Visit: Payer: Self-pay | Admitting: Oncology

## 2016-02-29 DIAGNOSIS — C7951 Secondary malignant neoplasm of bone: Secondary | ICD-10-CM | POA: Diagnosis not present

## 2016-02-29 LAB — CBC WITH DIFFERENTIAL/PLATELET
BASO%: 0.5 % (ref 0.0–2.0)
Basophils Absolute: 0 10*3/uL (ref 0.0–0.1)
EOS%: 0.3 % (ref 0.0–7.0)
Eosinophils Absolute: 0 10*3/uL (ref 0.0–0.5)
HCT: 38.6 % (ref 38.4–49.9)
HGB: 13 g/dL (ref 13.0–17.1)
LYMPH%: 13.1 % — ABNORMAL LOW (ref 14.0–49.0)
MCH: 31.5 pg (ref 27.2–33.4)
MCHC: 33.7 g/dL (ref 32.0–36.0)
MCV: 93.6 fL (ref 79.3–98.0)
MONO#: 0.8 10*3/uL (ref 0.1–0.9)
MONO%: 10.5 % (ref 0.0–14.0)
NEUT#: 5.5 10*3/uL (ref 1.5–6.5)
NEUT%: 75.6 % — ABNORMAL HIGH (ref 39.0–75.0)
Platelets: 276 10*3/uL (ref 140–400)
RBC: 4.12 10*6/uL — ABNORMAL LOW (ref 4.20–5.82)
RDW: 13.9 % (ref 11.0–14.6)
WBC: 7.3 10*3/uL (ref 4.0–10.3)
lymph#: 1 10*3/uL (ref 0.9–3.3)

## 2016-03-01 ENCOUNTER — Ambulatory Visit: Payer: Medicare Other

## 2016-03-06 ENCOUNTER — Telehealth: Payer: Self-pay | Admitting: *Deleted

## 2016-03-06 NOTE — Telephone Encounter (Signed)
Called patient to remind of Xofigo Inj. On 03-07-16 - arrival time - 11:45 am @ [facility], spoke with patient and he is aware of this appt.

## 2016-03-07 ENCOUNTER — Ambulatory Visit (HOSPITAL_COMMUNITY)
Admission: RE | Admit: 2016-03-07 | Discharge: 2016-03-07 | Disposition: A | Discharge status: Home / Self Care | Payer: Medicare Other | Source: Ambulatory Visit | Attending: Radiation Oncology | Admitting: Radiation Oncology

## 2016-03-07 DIAGNOSIS — C7951 Secondary malignant neoplasm of bone: Secondary | ICD-10-CM | POA: Diagnosis not present

## 2016-03-07 DIAGNOSIS — C61 Malignant neoplasm of prostate: Secondary | ICD-10-CM | POA: Diagnosis not present

## 2016-03-07 MED ORDER — RADIUM RA 223 DICHLORIDE 30 MCCI/ML IV SOLN
98.6000 | Freq: Once | INTRAVENOUS | Status: AC
  Administered 2016-03-07: 98.6 via INTRAVENOUS

## 2016-03-08 ENCOUNTER — Ambulatory Visit (HOSPITAL_COMMUNITY): Payer: Medicare Other

## 2016-03-09 ENCOUNTER — Ambulatory Visit (HOSPITAL_BASED_OUTPATIENT_CLINIC_OR_DEPARTMENT_OTHER): Payer: Medicare Other | Admitting: Hematology & Oncology

## 2016-03-09 ENCOUNTER — Other Ambulatory Visit (HOSPITAL_BASED_OUTPATIENT_CLINIC_OR_DEPARTMENT_OTHER): Payer: Medicare Other

## 2016-03-09 ENCOUNTER — Ambulatory Visit (HOSPITAL_BASED_OUTPATIENT_CLINIC_OR_DEPARTMENT_OTHER): Payer: Medicare Other

## 2016-03-09 VITALS — BP 131/69 | HR 71 | Temp 98.1°F | Resp 17 | Wt 152.0 lb

## 2016-03-09 DIAGNOSIS — C61 Malignant neoplasm of prostate: Secondary | ICD-10-CM | POA: Diagnosis not present

## 2016-03-09 DIAGNOSIS — C7951 Secondary malignant neoplasm of bone: Secondary | ICD-10-CM | POA: Diagnosis not present

## 2016-03-09 DIAGNOSIS — Z5111 Encounter for antineoplastic chemotherapy: Secondary | ICD-10-CM | POA: Diagnosis present

## 2016-03-09 LAB — CBC WITH DIFFERENTIAL (CANCER CENTER ONLY)
BASO#: 0 10*3/uL (ref 0.0–0.2)
BASO%: 0.2 % (ref 0.0–2.0)
EOS%: 0.3 % (ref 0.0–7.0)
Eosinophils Absolute: 0 10*3/uL (ref 0.0–0.5)
HCT: 40.3 % (ref 38.7–49.9)
HGB: 13.3 g/dL (ref 13.0–17.1)
LYMPH#: 1 10*3/uL (ref 0.9–3.3)
LYMPH%: 9.7 % — ABNORMAL LOW (ref 14.0–48.0)
MCH: 32.3 pg (ref 28.0–33.4)
MCHC: 33 g/dL (ref 32.0–35.9)
MCV: 98 fL (ref 82–98)
MONO#: 0.7 10*3/uL (ref 0.1–0.9)
MONO%: 7.2 % (ref 0.0–13.0)
NEUT#: 8.3 10*3/uL — ABNORMAL HIGH (ref 1.5–6.5)
NEUT%: 82.6 % — ABNORMAL HIGH (ref 40.0–80.0)
Platelets: 243 10*3/uL (ref 145–400)
RBC: 4.12 10*6/uL — ABNORMAL LOW (ref 4.20–5.70)
RDW: 13.4 % (ref 11.1–15.7)
WBC: 10 10*3/uL (ref 4.0–10.0)

## 2016-03-09 LAB — CMP (CANCER CENTER ONLY)
ALT(SGPT): 21 U/L (ref 10–47)
AST: 19 U/L (ref 11–38)
Albumin: 3.7 g/dL (ref 3.3–5.5)
Alkaline Phosphatase: 76 U/L (ref 26–84)
BUN, Bld: 16 mg/dL (ref 7–22)
CO2: 30 mEq/L (ref 18–33)
Calcium: 10 mg/dL (ref 8.0–10.3)
Chloride: 99 mEq/L (ref 98–108)
Creat: 1.2 mg/dl (ref 0.6–1.2)
Glucose, Bld: 115 mg/dL (ref 73–118)
Potassium: 4.4 mEq/L (ref 3.3–4.7)
Sodium: 142 mEq/L (ref 128–145)
Total Bilirubin: 0.7 mg/dl (ref 0.20–1.60)
Total Protein: 7 g/dL (ref 6.4–8.1)

## 2016-03-09 MED ORDER — DENOSUMAB 120 MG/1.7ML ~~LOC~~ SOLN
120.0000 mg | Freq: Once | SUBCUTANEOUS | Status: AC
  Administered 2016-03-09: 120 mg via SUBCUTANEOUS
  Filled 2016-03-09: qty 1.7

## 2016-03-09 MED ORDER — LEUPROLIDE ACETATE (3 MONTH) 22.5 MG IM KIT
22.5000 mg | PACK | Freq: Once | INTRAMUSCULAR | Status: AC
  Administered 2016-03-09: 22.5 mg via INTRAMUSCULAR
  Filled 2016-03-09: qty 22.5

## 2016-03-09 MED FILL — ZYTIGA 250 MG TABLET: 250 | 30 days supply | Qty: 120 | Fill #2

## 2016-03-09 NOTE — Progress Notes (Signed)
Hematology and Oncology Follow Up Visit  [name] [id number] 10/23/1945 71 y.o. 03/09/2016   Principle Diagnosis:   Metastatic castrate resistant prostate cancer  Current Therapy:    Zytiga 1000 mg by mouth daily  Xgeva 120 mg subcutaneous Q3 months - due in  May  Lupron 22 mg IM every 3 months - due in May  Radium-223 therapy -- s/p cycle #6  Palliative radiation to the right sacrum     Interim History:  [name] is back for followup.he looks great. He really has had no implants. There's been no pain. He completed the [place] in January. He did well with this. I have to believe that this has really helped.  He still has a very low PSA. Last time we checked, his PSA was less than 0.1.  He has had no problems with fatigue or weakness. He is watching his diet because of hyperglycemia.  He is doing well with Zytiga. He has had no nausea or vomiting from this.  There's been no hot flashes. He's had no cough. He's had no proximal with influenza.  Overall, his performance status is ECOG 0.    Medications:  Current Outpatient Prescriptions:  .  abiraterone Acetate (ZYTIGA) 250 MG tablet, TAKE 4 TABLETS BY MOUTH DAILY. TAKE ON AN EMPTY STOMACH 1 HOUR BEFORE OR 2 HOURS AFTER A MEAL, Disp: 120 tablet, Rfl: 3 .  acetaminophen (TYLENOL) 500 MG tablet, Take 1,000 mg by mouth every 4 (four) hours as needed for moderate pain., Disp: , Rfl:  .  aspirin 81 MG tablet, Take 81 mg by mouth daily., Disp: , Rfl:  .  Calcium Carbonate-Vitamin D (CALCIUM + D PO), Take 770 mg by mouth 2 (two) times daily. , Disp: , Rfl:  .  gabapentin (NEURONTIN) 300 MG capsule, Take 1 pill twice a day, then take 2 at bedtime, Disp: 120 capsule, Rfl: 4 .  HYDROmorphone (DILAUDID) 4 MG tablet, Take 1 tablet (4 mg total) by mouth every 6 (six) hours as needed for severe pain. (Patient not taking: Reported on 12/16/2015), Disp: 20 tablet, Rfl: 0 .  ipratropium (ATROVENT) 0.06 % nasal spray, Place 1 spray into  both nostrils 4 (four) times daily., Disp: , Rfl:  .  isosorbide mononitrate (IMDUR) 30 MG 24 hr tablet, Take 30 mg by mouth daily., Disp: , Rfl:  .  metFORMIN (GLUCOPHAGE-XR) 500 MG 24 hr tablet, Take 2 tablets (1,000 mg total) by mouth daily with breakfast., Disp: 180 tablet, Rfl: 2 .  ondansetron (ZOFRAN ODT) 4 MG disintegrating tablet, Take 1 tablet (4 mg total) by mouth every 8 (eight) hours as needed for nausea or vomiting. (Patient not taking: Reported on 12/16/2015), Disp: 12 tablet, Rfl: 2 .  OVER THE COUNTER MEDICATION, every morning. Juice Plus -- 8 capsule of each the garden, vineyard, and orchead, Disp: , Rfl:  .  predniSONE (DELTASONE) 5 MG tablet, TAKE 1 TABLET BY MOUTH DAILY, Disp: 60 tablet, Rfl: 3 .  prochlorperazine (COMPAZINE) 10 MG tablet, Take 1 tablet (10 mg total) by mouth every 6 (six) hours as needed for nausea or vomiting. (Patient not taking: Reported on 12/16/2015), Disp: 30 tablet, Rfl: 0 .  traMADol (ULTRAM) 50 MG tablet, Take 1 tablet (50 mg total) by mouth every 6 (six) hours as needed. (Patient not taking: Reported on 12/16/2015), Disp: 90 tablet, Rfl: 2 .  ZETIA 10 MG tablet, 10 mg daily., Disp: , Rfl:   Allergies:  Allergies  Allergen Reactions  . Codeine  Nausea And Vomiting  . Hydrocodone Nausea And Vomiting    Past Medical History, Surgical history, Social history, and Family History were reviewed and updated.  Review of Systems: As above  Physical Exam:  weight is 152 lb (68.9 kg). His oral temperature is 98.1 F (36.7 C). His blood pressure is 131/69 and his pulse is 71. His respiration is 17 and oxygen saturation is 100%.   Well-developed and well-nourished white male. Head and neck exam shows no ocular or oral lesions. The are no palpable cervical or supraclavicular lymph nodes. Lungs are clear with no rales, wheezes or rhonchi. Cardiac exam regular rate and rhythm with no murmurs rubs or bruits. Abdomen is soft. He has good bowel sounds. There is no  fluid wave. There is no palpable liver or spleen tip. Back exam shows no tenderness over the spine ribs or hips. I cannot elicit any tenderness over the sacrum bilaterally. He has good range of motion of his pelvis. Extremities shows no clubbing, cyanosis or edema. Skin exam shows areas of urticaria on the left forearm.. There is also an area as solitary in the upper chest wall in the upper sternal region. These are nontender. There are erythematous with a central area of vesicle. No erythema to the skin is noted. Neurological exam is nonfocal.   Lab Results  Component Value Date   WBC 10.0 03/09/2016   HGB 13.3 03/09/2016   HCT 40.3 03/09/2016   MCV 98 03/09/2016   PLT 243 03/09/2016     Chemistry      Component Value Date/Time   NA 142 03/09/2016 1517   NA 141 10/14/2015 0803   K 4.4 03/09/2016 1517   K 4.2 10/14/2015 0803   CL 99 03/09/2016 1517   CO2 30 03/09/2016 1517   CO2 27 10/14/2015 0803   BUN 16 03/09/2016 1517   BUN 17.9 10/14/2015 0803   CREATININE 1.2 03/09/2016 1517   CREATININE 1.2 10/14/2015 0803      Component Value Date/Time   CALCIUM 10.0 03/09/2016 1517   CALCIUM 10.0 10/14/2015 0803   ALKPHOS 76 03/09/2016 1517   ALKPHOS 78 10/14/2015 0803   AST 19 03/09/2016 1517   AST 17 10/14/2015 0803   ALT 21 03/09/2016 1517   ALT 12 10/14/2015 0803   BILITOT 0.70 03/09/2016 1517   BILITOT 0.45 10/14/2015 0803         Impression and Plan: [name] is 71 year old gentleman with metastatic prostate cancer.   He has completed his Xofigo. I'm so happy about this.  I think we will re-evaluate him in about a month. We now have the new nuclear medicine PET scan with the tracer material for prostate cancer. I think this would be very helpful.  He will not need another dose of Lupron or Xgeva until May.  He is doing well with the Zytiga.  I spent about 35 minutes with he and his wife.    [name], MD 2/23/20185:09 PM

## 2016-03-09 NOTE — Patient Instructions (Signed)
Leuprolide depot injection What is this medicine? LEUPROLIDE (loo PROE lide) is a man-made protein that acts like a natural hormone in the body. It decreases testosterone in men and decreases estrogen in women. In men, this medicine is used to treat advanced prostate cancer. In women, some forms of this medicine may be used to treat endometriosis, uterine fibroids, or other male hormone-related problems. This medicine may be used for other purposes; ask your health care provider or pharmacist if you have questions. COMMON BRAND NAME(S): Eligard, Lupron Depot, Lupron Depot-Ped, Viadur What should I tell my health care provider before I take this medicine? They need to know if you have any of these conditions: -diabetes -heart disease or previous heart attack -high blood pressure -high cholesterol -mental illness -osteoporosis -pain or difficulty passing urine -seizures -spinal cord metastasis -stroke -suicidal thoughts, plans, or attempt; a previous suicide attempt by you or a family member -tobacco smoker -unusual vaginal bleeding (women) -an unusual or allergic reaction to leuprolide, benzyl alcohol, other medicines, foods, dyes, or preservatives -pregnant or trying to get pregnant -breast-feeding How should I use this medicine? This medicine is for injection into a muscle or for injection under the skin. It is given by a health care professional in a hospital or clinic setting. The specific product will determine how it will be given to you. Make sure you understand which product you receive and how often you will receive it. Talk to your pediatrician regarding the use of this medicine in children. Special care may be needed. Overdosage: If you think you have taken too much of this medicine contact a poison control center or emergency room at once. NOTE: This medicine is only for you. Do not share this medicine with others. What if I miss a dose? It is important not to miss a dose.  Call your doctor or health care professional if you are unable to keep an appointment. Depot injections: Depot injections are given either once-monthly, every 12 weeks, every 16 weeks, or every 24 weeks depending on the product you are prescribed. The product you are prescribed will be based on if you are male or male, and your condition. Make sure you understand your product and dosing. What may interact with this medicine? Do not take this medicine with any of the following medications: -chasteberry This medicine may also interact with the following medications: -herbal or dietary supplements, like black cohosh or DHEA -male hormones, like estrogens or progestins and birth control pills, patches, rings, or injections -male hormones, like testosterone This list may not describe all possible interactions. Give your health care provider a list of all the medicines, herbs, non-prescription drugs, or dietary supplements you use. Also tell them if you smoke, drink alcohol, or use illegal drugs. Some items may interact with your medicine. What should I watch for while using this medicine? Visit your doctor or health care professional for regular checks on your progress. During the first weeks of treatment, your symptoms may get worse, but then will improve as you continue your treatment. You may get hot flashes, increased bone pain, increased difficulty passing urine, or an aggravation of nerve symptoms. Discuss these effects with your doctor or health care professional, some of them may improve with continued use of this medicine. Male patients may experience a menstrual cycle or spotting during the first months of therapy with this medicine. If this continues, contact your doctor or health care professional. What side effects may I notice from receiving this medicine? Side   effects that you should report to your doctor or health care professional as soon as possible: -allergic reactions like skin  rash, itching or hives, swelling of the face, lips, or tongue -breathing problems -chest pain -depression or memory disorders -pain in your legs or groin -pain at site where injected or implanted -severe headache -swelling of the feet and legs -visual changes -vomiting Side effects that usually do not require medical attention (report to your doctor or health care professional if they continue or are bothersome): -breast swelling or tenderness -decrease in sex drive or performance -diarrhea -hot flashes -loss of appetite -muscle, joint, or bone pains -nausea -redness or irritation at site where injected or implanted -skin problems or acne This list may not describe all possible side effects. Call your doctor for medical advice about side effects. You may report side effects to FDA at 1-800-FDA-1088. Where should I keep my medicine? This drug is given in a hospital or clinic and will not be stored at home. NOTE: This sheet is a summary. It may not cover all possible information. If you have questions about this medicine, talk to your doctor, pharmacist, or health care provider.  2017 Elsevier/Gold Standard (2015-06-16 09:45:17) Denosumab injection What is this medicine? DENOSUMAB (den oh sue mab) slows bone breakdown. Prolia is used to treat osteoporosis in women after menopause and in men. Delton See is used to prevent bone fractures and other bone problems caused by cancer bone metastases. Delton See is also used to treat giant cell tumor of the bone. COMMON BRAND NAME(S): Prolia, XGEVA What should I tell my health care provider before I take this medicine? They need to know if you have any of these conditions: -dental disease -eczema -infection or history of infections -kidney disease or on dialysis -low blood calcium or vitamin D -malabsorption syndrome -scheduled to have surgery or tooth extraction -taking medicine that contains denosumab -thyroid or parathyroid disease -an unusual  reaction to denosumab, other medicines, foods, dyes, or preservatives -pregnant or trying to get pregnant -breast-feeding How should I use this medicine? This medicine is for injection under the skin. It is given by a health care professional in a hospital or clinic setting. If you are getting Prolia, a special MedGuide will be given to you by the pharmacist with each prescription and refill. Be sure to read this information carefully each time. For Prolia, talk to your pediatrician regarding the use of this medicine in children. Special care may be needed. For Delton See, talk to your pediatrician regarding the use of this medicine in children. While this drug may be prescribed for children as young as 13 years for selected conditions, precautions do apply. What if I miss a dose? It is important not to miss your dose. Call your doctor or health care professional if you are unable to keep an appointment. What may interact with this medicine? Do not take this medicine with any of the following medications: -other medicines containing denosumab This medicine may also interact with the following medications: -medicines that suppress the immune system -medicines that treat cancer -steroid medicines like prednisone or cortisone What should I watch for while using this medicine? Visit your doctor or health care professional for regular checks on your progress. Your doctor or health care professional may order blood tests and other tests to see how you are doing. Call your doctor or health care professional if you get a cold or other infection while receiving this medicine. Do not treat yourself. This medicine may decrease your  body's ability to fight infection. You should make sure you get enough calcium and vitamin D while you are taking this medicine, unless your doctor tells you not to. Discuss the foods you eat and the vitamins you take with your health care professional. See your dentist regularly.  Brush and floss your teeth as directed. Before you have any dental work done, tell your dentist you are receiving this medicine. Do not become pregnant while taking this medicine or for 5 months after stopping it. Women should inform their doctor if they wish to become pregnant or think they might be pregnant. There is a potential for serious side effects to an unborn child. Talk to your health care professional or pharmacist for more information. What side effects may I notice from receiving this medicine? Side effects that you should report to your doctor or health care professional as soon as possible: -allergic reactions like skin rash, itching or hives, swelling of the face, lips, or tongue -breathing problems -chest pain -fast, irregular heartbeat -feeling faint or lightheaded, falls -fever, chills, or any other sign of infection -muscle spasms, tightening, or twitches -numbness or tingling -skin blisters or bumps, or is dry, peels, or red -slow healing or unexplained pain in the mouth or jaw -unusual bleeding or bruising Side effects that usually do not require medical attention (report to your doctor or health care professional if they continue or are bothersome): -muscle pain -stomach upset, gas Where should I keep my medicine? This medicine is only given in a clinic, doctor's office, or other health care setting and will not be stored at home.  2017 Elsevier/Gold Standard (2015-02-03 10:06:55)

## 2016-03-10 LAB — PSA: Prostate Specific Ag, Serum: <0.1 ng/mL (ref 0.0–4.0)

## 2016-03-12 ENCOUNTER — Telehealth: Payer: Self-pay | Admitting: *Deleted

## 2016-03-12 NOTE — Telephone Encounter (Addendum)
Patient's wife aware of results.   ----- Message from [name], MD sent at 03/10/2016  8:55 AM EST ----- Call - PSA is still not detectable.  Laurey Arrow

## 2016-03-20 ENCOUNTER — Ambulatory Visit (HOSPITAL_COMMUNITY)
Admission: RE | Admit: 2016-03-20 | Discharge: 2016-03-20 | Disposition: A | Discharge status: Home / Self Care | Payer: Medicare Other | Source: Ambulatory Visit | Attending: Hematology & Oncology | Admitting: Hematology & Oncology

## 2016-03-20 DIAGNOSIS — C7951 Secondary malignant neoplasm of bone: Secondary | ICD-10-CM | POA: Diagnosis not present

## 2016-03-20 DIAGNOSIS — C61 Malignant neoplasm of prostate: Secondary | ICD-10-CM | POA: Diagnosis not present

## 2016-03-20 MED ORDER — GADOBENATE DIMEGLUMINE 529 MG/ML IV SOLN
15.0000 mL | Freq: Once | INTRAVENOUS | Status: AC | PRN
  Administered 2016-03-20: 14 mL via INTRAVENOUS

## 2016-03-29 DIAGNOSIS — R112 Nausea with vomiting, unspecified: Secondary | ICD-10-CM | POA: Diagnosis not present

## 2016-03-29 DIAGNOSIS — J208 Acute bronchitis due to other specified organisms: Secondary | ICD-10-CM | POA: Diagnosis not present

## 2016-03-29 DIAGNOSIS — Z6824 Body mass index (BMI) 24.0-24.9, adult: Secondary | ICD-10-CM | POA: Diagnosis not present

## 2016-03-29 DIAGNOSIS — J101 Influenza due to other identified influenza virus with other respiratory manifestations: Secondary | ICD-10-CM | POA: Diagnosis not present

## 2016-03-30 ENCOUNTER — Encounter: Payer: Self-pay | Admitting: Hematology & Oncology

## 2016-04-05 ENCOUNTER — Encounter (HOSPITAL_COMMUNITY)
Admission: RE | Admit: 2016-04-05 | Discharge: 2016-04-05 | Disposition: A | Discharge status: Home / Self Care | Payer: Medicare Other | Source: Ambulatory Visit | Attending: Hematology & Oncology | Admitting: Hematology & Oncology

## 2016-04-05 DIAGNOSIS — C7951 Secondary malignant neoplasm of bone: Secondary | ICD-10-CM | POA: Diagnosis not present

## 2016-04-05 DIAGNOSIS — R918 Other nonspecific abnormal finding of lung field: Secondary | ICD-10-CM | POA: Insufficient documentation

## 2016-04-05 DIAGNOSIS — C61 Malignant neoplasm of prostate: Secondary | ICD-10-CM | POA: Diagnosis not present

## 2016-04-05 MED ORDER — AXUMIN (FLUCICLOVINE F 18) INJECTION
10.0200 | Freq: Once | INTRAVENOUS | Status: AC
  Administered 2016-04-05: 10.02 via INTRAVENOUS

## 2016-04-05 NOTE — Progress Notes (Signed)
°  Radiation Oncology         [phone] ________________________________  Name: [name] MRN: [id number]  Date: 02/09/2016  DOB: 05-13-1945  Radium-223 Infusion Note  Diagnosis:  Castration resistant prostate cancer with painful bone involvement  Current Infusion:    5  Planned Infusions:  6  Narrative: [name] presented to nuclear medicine for treatment. His most recent blood counts were reviewed.  He remains a good candidate to proceed with Ra-223.  The patient was situated in an infusion suite with a contact barrier placed under his arm. Intravenous access was established, using sterile technique, and a normal saline infusion from a syringe was started.  Micro-dosimetry:  The prescribed radiation activity was assayed and confirmed to be within specified tolerance.  Special Treatment Procedure - Infusion:  The nuclear medicine technologist and I personally verified the dose activity to be delivered as specified in the written directive, and verified the patient identification via 2 separate methods.  The syringe containing the dose was attached to a 3 way stopcock, and then the valve was opened to the patient, and the dose delivered over a minute. No complications were noted.  The total administered dose was 105.1 microcuries in a volume of 4.1 cc.  A saline flush of the line and the syringe that contained the isotope was then performed.  The residual radioactivity in the syringe was 3.85 microcuries, so the actual infused isotope activity was 101.25 microcuries.   Pressure was applied to the venipuncture site, and a compression bandage placed.   Radiation Safety personnel were present to perform the discharge survey, as detailed on their documentation.   After a short period of observation, the patient had his IV removed.  Impression:  The patient tolerated his infusion relatively well.  Plan:  The patient will return in one month for ongoing care.      -----------------------------------  Blair Promise, PhD, MD  This document serves as a record of services personally performed by [name], MD. It was created on his behalf by Darcus Austin, a trained medical scribe. The creation of this record is based on the scribe's personal observations and the provider's statements to them. This document has been checked and approved by the attending provider.

## 2016-04-06 DIAGNOSIS — H5203 Hypermetropia, bilateral: Secondary | ICD-10-CM | POA: Diagnosis not present

## 2016-04-06 DIAGNOSIS — H25813 Combined forms of age-related cataract, bilateral: Secondary | ICD-10-CM | POA: Diagnosis not present

## 2016-04-06 DIAGNOSIS — H43813 Vitreous degeneration, bilateral: Secondary | ICD-10-CM | POA: Diagnosis not present

## 2016-04-06 DIAGNOSIS — H43393 Other vitreous opacities, bilateral: Secondary | ICD-10-CM | POA: Diagnosis not present

## 2016-04-06 DIAGNOSIS — H524 Presbyopia: Secondary | ICD-10-CM | POA: Diagnosis not present

## 2016-04-06 DIAGNOSIS — L219 Seborrheic dermatitis, unspecified: Secondary | ICD-10-CM | POA: Diagnosis not present

## 2016-04-06 DIAGNOSIS — L570 Actinic keratosis: Secondary | ICD-10-CM | POA: Diagnosis not present

## 2016-04-06 DIAGNOSIS — H52223 Regular astigmatism, bilateral: Secondary | ICD-10-CM | POA: Diagnosis not present

## 2016-04-12 ENCOUNTER — Other Ambulatory Visit: Payer: Self-pay | Admitting: Hematology & Oncology

## 2016-04-12 DIAGNOSIS — Z6823 Body mass index (BMI) 23.0-23.9, adult: Secondary | ICD-10-CM | POA: Diagnosis not present

## 2016-04-12 DIAGNOSIS — G63 Polyneuropathy in diseases classified elsewhere: Principal | ICD-10-CM

## 2016-04-12 DIAGNOSIS — C61 Malignant neoplasm of prostate: Secondary | ICD-10-CM

## 2016-04-12 DIAGNOSIS — C801 Malignant (primary) neoplasm, unspecified: Secondary | ICD-10-CM

## 2016-04-12 DIAGNOSIS — E119 Type 2 diabetes mellitus without complications: Secondary | ICD-10-CM | POA: Diagnosis not present

## 2016-04-12 DIAGNOSIS — Z1211 Encounter for screening for malignant neoplasm of colon: Secondary | ICD-10-CM | POA: Diagnosis not present

## 2016-04-12 DIAGNOSIS — R079 Chest pain, unspecified: Secondary | ICD-10-CM | POA: Diagnosis not present

## 2016-04-12 DIAGNOSIS — E785 Hyperlipidemia, unspecified: Secondary | ICD-10-CM | POA: Diagnosis not present

## 2016-04-12 MED FILL — GABAPENTIN 300 MG CAPSULE: 300 | 30 days supply | Qty: 120 | Fill #0

## 2016-04-12 MED FILL — ZYTIGA 250 MG TABLET: 250 | 30 days supply | Qty: 120 | Fill #3

## 2016-04-12 MED FILL — predniSONE 5 MG TABS: 5 | 60 days supply | Qty: 60 | Fill #2

## 2016-04-12 MED FILL — ISOSORBIDE MN ER 30 MG TAB: 30 | 90 days supply | Qty: 90 | Fill #0

## 2016-04-12 MED FILL — METFORMIN HCL ER 500 MG TAB: 500 | 90 days supply | Qty: 180 | Fill #2

## 2016-04-25 ENCOUNTER — Other Ambulatory Visit: Payer: Medicare Other

## 2016-04-25 ENCOUNTER — Ambulatory Visit (HOSPITAL_BASED_OUTPATIENT_CLINIC_OR_DEPARTMENT_OTHER): Payer: Medicare Other | Admitting: Hematology & Oncology

## 2016-04-25 ENCOUNTER — Ambulatory Visit: Payer: Medicare Other | Admitting: Hematology & Oncology

## 2016-04-25 ENCOUNTER — Other Ambulatory Visit (HOSPITAL_BASED_OUTPATIENT_CLINIC_OR_DEPARTMENT_OTHER): Payer: Medicare Other

## 2016-04-25 VITALS — BP 130/67 | HR 99 | Temp 98.3°F | Resp 17 | Wt 154.4 lb

## 2016-04-25 DIAGNOSIS — C61 Malignant neoplasm of prostate: Secondary | ICD-10-CM

## 2016-04-25 DIAGNOSIS — C7951 Secondary malignant neoplasm of bone: Secondary | ICD-10-CM

## 2016-04-25 LAB — CBC WITH DIFFERENTIAL (CANCER CENTER ONLY)
BASO#: 0 10*3/uL (ref 0.0–0.2)
BASO%: 0.4 % (ref 0.0–2.0)
EOS%: 1.9 % (ref 0.0–7.0)
Eosinophils Absolute: 0.1 10*3/uL (ref 0.0–0.5)
HCT: 38.4 % — ABNORMAL LOW (ref 38.7–49.9)
HGB: 12.8 g/dL — ABNORMAL LOW (ref 13.0–17.1)
LYMPH#: 0.8 10*3/uL — ABNORMAL LOW (ref 0.9–3.3)
LYMPH%: 14.3 % (ref 14.0–48.0)
MCH: 32.8 pg (ref 28.0–33.4)
MCHC: 33.3 g/dL (ref 32.0–35.9)
MCV: 99 fL — ABNORMAL HIGH (ref 82–98)
MONO#: 0.4 10*3/uL (ref 0.1–0.9)
MONO%: 7.6 % (ref 0.0–13.0)
NEUT#: 4.3 10*3/uL (ref 1.5–6.5)
NEUT%: 75.8 % (ref 40.0–80.0)
Platelets: 213 10*3/uL (ref 145–400)
RBC: 3.9 10*6/uL — ABNORMAL LOW (ref 4.20–5.70)
RDW: 13.4 % (ref 11.1–15.7)
WBC: 5.7 10*3/uL (ref 4.0–10.0)

## 2016-04-25 LAB — COMPREHENSIVE METABOLIC PANEL (CC13)
ALT: 14 IU/L (ref 0–44)
AST (SGOT): 19 IU/L (ref 0–40)
Albumin, Serum: 4.1 g/dL (ref 3.6–4.8)
Albumin/Globulin Ratio: 1.6 (ref 1.2–2.2)
Alkaline Phosphatase, S: 62 IU/L (ref 39–117)
BUN/Creatinine Ratio: 13 (ref 10–24)
BUN: 15 mg/dL (ref 8–27)
Bilirubin Total: 0.2 mg/dL (ref 0.0–1.2)
Calcium, Ser: 9.7 mg/dL (ref 8.6–10.2)
Carbon Dioxide, Total: 29 mmol/L (ref 18–29)
Chloride, Ser: 99 mmol/L (ref 96–106)
Creatinine, Ser: 1.18 mg/dL (ref 0.76–1.27)
GFR calc Af Amer: 73 mL/min/1.73
GFR calc non Af Amer: 63 mL/min/1.73
Globulin, Total: 2.6 g/dL (ref 1.5–4.5)
Glucose: 144 mg/dL — ABNORMAL HIGH (ref 65–99)
Potassium, Ser: 4.5 mmol/L (ref 3.5–5.2)
Sodium: 141 mmol/L (ref 134–144)
Total Protein: 6.7 g/dL (ref 6.0–8.5)

## 2016-04-25 NOTE — Progress Notes (Signed)
Hematology and Oncology Follow Up Visit  [name] [id number] [birth year] 71 y.o. 04/25/2016   Principle Diagnosis:   Metastatic castrate resistant prostate cancer  Current Therapy:    Zytiga 1000 mg by mouth daily  Xgeva 120 mg subcutaneous Q3 months - due in  May  Lupron 22 mg IM every 3 months - due in May  Radium-223 therapy -- s/p cycle #6  Palliative radiation to the right sacrum     Interim History:  [name] is back for followup.he looks great. Since we last saw him, he's been doing well. He is working. He does a lot of stuff outside.  We did go ahead and do some radiological studies to assess his status. I checked his PSA. As always, this was quite low at less than 0.1.  We did do the new PET scan for prostate cancer. This showed no uptake in his bones. He had some very small (less than 1 cm) lung nodules. These were of minimal uptake. I suspect that these could be environmentally induced.  His MRI, however, did seem to show some "progression". He had some disease that look like it was encroaching on the right S2 and S3 nerves. No new metastatic lesions were noted.  Again he is not complaining of any type of pain in his pelvis or perineal area. He has no bowel or bladder incontinence.  He's had no fever. He's had no bleeding.  His blood sugars are being watched. His last glucose was 115.   He did have a very nice [place] with his family.    Overall, his performance status is ECOG 0.    Medications:  Current Outpatient Prescriptions:  .  abiraterone Acetate (ZYTIGA) 250 MG tablet, TAKE 4 TABLETS BY MOUTH DAILY. TAKE ON AN EMPTY STOMACH 1 HOUR BEFORE OR 2 HOURS AFTER A MEAL, Disp: 120 tablet, Rfl: 3 .  acetaminophen (TYLENOL) 500 MG tablet, Take 1,000 mg by mouth every 4 (four) hours as needed for moderate pain., Disp: , Rfl:  .  aspirin 81 MG tablet, Take 81 mg by mouth daily., Disp: , Rfl:  .  Calcium Carbonate-Vitamin D (CALCIUM + D PO), Take 770 mg by  mouth 2 (two) times daily. , Disp: , Rfl:  .  gabapentin (NEURONTIN) 300 MG capsule, TAKE 1 CAPSULE BY MOUTH TWICE A DAY THEN TAKE 2 CAPSULES AT BEDTIME, Disp: 120 capsule, Rfl: 4 .  HYDROmorphone (DILAUDID) 4 MG tablet, Take 1 tablet (4 mg total) by mouth every 6 (six) hours as needed for severe pain., Disp: 20 tablet, Rfl: 0 .  ipratropium (ATROVENT) 0.06 % nasal spray, Place 1 spray into both nostrils 4 (four) times daily., Disp: , Rfl:  .  isosorbide mononitrate (IMDUR) 30 MG 24 hr tablet, Take 30 mg by mouth daily., Disp: , Rfl:  .  metFORMIN (GLUCOPHAGE-XR) 500 MG 24 hr tablet, Take 2 tablets (1,000 mg total) by mouth daily with breakfast., Disp: 180 tablet, Rfl: 2 .  ondansetron (ZOFRAN ODT) 4 MG disintegrating tablet, Take 1 tablet (4 mg total) by mouth every 8 (eight) hours as needed for nausea or vomiting., Disp: 12 tablet, Rfl: 2 .  OVER THE COUNTER MEDICATION, every morning. Juice Plus -- 8 capsule of each the garden, vineyard, and orchead, Disp: , Rfl:  .  predniSONE (DELTASONE) 5 MG tablet, TAKE 1 TABLET BY MOUTH DAILY, Disp: 60 tablet, Rfl: 3 .  prochlorperazine (COMPAZINE) 10 MG tablet, Take 1 tablet (10 mg total) by mouth every 6 (  six) hours as needed for nausea or vomiting., Disp: 30 tablet, Rfl: 0 .  traMADol (ULTRAM) 50 MG tablet, Take 1 tablet (50 mg total) by mouth every 6 (six) hours as needed., Disp: 90 tablet, Rfl: 2 .  ZETIA 10 MG tablet, 10 mg daily., Disp: , Rfl:   Allergies:  Allergies  Allergen Reactions  . Codeine Nausea And Vomiting  . Hydrocodone Nausea And Vomiting    Past Medical History, Surgical history, Social history, and Family History were reviewed and updated.  Review of Systems: As above  Physical Exam:  weight is 154 lb 6.4 oz (70 kg). His oral temperature is 98.3 F (36.8 C). His blood pressure is 130/67 and his pulse is 99. His respiration is 17 and oxygen saturation is 95%.   Well-developed and well-nourished white male. Head and neck exam  shows no ocular or oral lesions. The are no palpable cervical or supraclavicular lymph nodes. Lungs are clear with no rales, wheezes or rhonchi. Cardiac exam regular rate and rhythm with no murmurs rubs or bruits. Abdomen is soft. He has good bowel sounds. There is no fluid wave. There is no palpable liver or spleen tip. Back exam shows no tenderness over the spine ribs or hips. I cannot elicit any tenderness over the sacrum bilaterally. He has good range of motion of his pelvis. Extremities shows no clubbing, cyanosis or edema. Skin exam shows areas of urticaria on the left forearm.. There is also an area as solitary in the upper chest wall in the upper sternal region. These are nontender. There are erythematous with a central area of vesicle. No erythema to the skin is noted. Neurological exam is nonfocal.   Lab Results  Component Value Date   WBC 5.7 04/25/2016   HGB 12.8 (L) 04/25/2016   HCT 38.4 (L) 04/25/2016   MCV 99 (H) 04/25/2016   PLT 213 04/25/2016     Chemistry      Component Value Date/Time   NA 142 03/09/2016 1517   NA 141 10/14/2015 0803   K 4.4 03/09/2016 1517   K 4.2 10/14/2015 0803   CL 99 03/09/2016 1517   CO2 30 03/09/2016 1517   CO2 27 10/14/2015 0803   BUN 16 03/09/2016 1517   BUN 17.9 10/14/2015 0803   CREATININE 1.2 03/09/2016 1517   CREATININE 1.2 10/14/2015 0803      Component Value Date/Time   CALCIUM 10.0 03/09/2016 1517   CALCIUM 10.0 10/14/2015 0803   ALKPHOS 76 03/09/2016 1517   ALKPHOS 78 10/14/2015 0803   AST 19 03/09/2016 1517   AST 17 10/14/2015 0803   ALT 21 03/09/2016 1517   ALT 12 10/14/2015 0803   BILITOT 0.70 03/09/2016 1517   BILITOT 0.45 10/14/2015 0803         Impression and Plan: [name] is 71 year old gentleman with metastatic prostate cancer.   He has completed his Xofigo. He completed external beam radiation therapy.  I am glad that the prostate cancer specific PET scan does not show any bony uptake that would indicate  active cancer. However, I'm not sure how to reconcile the fact that the MRI seems to show some progression at S2, S3. He is totally asymptomatic.  I think going to have to follow this closely. I will repeat his scans/MRI in about 2 months. I think this will tell us what might be going on.  I suspect that some of the changes that are being seen could be "remodeling" of his bones after  treatment.  If we truly do fine and increase in his disease, that we will have to adjust his protocol and consider using XTANDI.  Currently, his quality of life is doing so well.  I spent about 45 minutes with the, wife and sister.  I will see him back in one month. When he comes back, he will get his Xgeva and Lupron. I spent about 35 minutes with he and his wife.    [name], MD 4/11/20185:24 PM

## 2016-04-26 ENCOUNTER — Telehealth: Payer: Self-pay | Admitting: *Deleted

## 2016-04-26 LAB — PSA: Prostate Specific Ag, Serum: <0.1 ng/mL (ref 0.0–4.0)

## 2016-04-26 NOTE — Telephone Encounter (Addendum)
Patient is aware of results.   ----- Message from [name], MD sent at 04/26/2016  7:15 AM EDT ----- Call - labz look ok!!  Blood sugar is a little high - 144.  Not too bad!!!  pete

## 2016-05-18 ENCOUNTER — Other Ambulatory Visit: Payer: Self-pay | Admitting: Hematology & Oncology

## 2016-05-18 DIAGNOSIS — C7951 Secondary malignant neoplasm of bone: Secondary | ICD-10-CM

## 2016-05-18 MED FILL — EZETIMIBE 10 MG TABLET: 10 | 90 days supply | Qty: 90 | Fill #1

## 2016-05-18 MED FILL — ZYTIGA 250 MG TABLET: 250 | 30 days supply | Qty: 120 | Fill #0

## 2016-05-18 MED FILL — GABAPENTIN 300 MG CAPSULE: 300 | 30 days supply | Qty: 120 | Fill #1

## 2016-05-25 ENCOUNTER — Other Ambulatory Visit (HOSPITAL_BASED_OUTPATIENT_CLINIC_OR_DEPARTMENT_OTHER): Payer: Medicare Other

## 2016-05-25 ENCOUNTER — Ambulatory Visit (HOSPITAL_BASED_OUTPATIENT_CLINIC_OR_DEPARTMENT_OTHER): Payer: Medicare Other | Admitting: Hematology & Oncology

## 2016-05-25 ENCOUNTER — Ambulatory Visit (HOSPITAL_BASED_OUTPATIENT_CLINIC_OR_DEPARTMENT_OTHER): Payer: Medicare Other

## 2016-05-25 VITALS — BP 93/57 | HR 95 | Temp 98.4°F | Resp 18 | Wt 155.1 lb

## 2016-05-25 DIAGNOSIS — Z5111 Encounter for antineoplastic chemotherapy: Secondary | ICD-10-CM | POA: Diagnosis present

## 2016-05-25 DIAGNOSIS — C61 Malignant neoplasm of prostate: Secondary | ICD-10-CM | POA: Diagnosis not present

## 2016-05-25 DIAGNOSIS — C7951 Secondary malignant neoplasm of bone: Secondary | ICD-10-CM

## 2016-05-25 LAB — CBC WITH DIFFERENTIAL (CANCER CENTER ONLY)
BASO#: 0.1 10*3/uL (ref 0.0–0.2)
BASO%: 0.8 % (ref 0.0–2.0)
EOS%: 4 % (ref 0.0–7.0)
Eosinophils Absolute: 0.2 10*3/uL (ref 0.0–0.5)
HCT: 36.4 % — ABNORMAL LOW (ref 38.7–49.9)
HGB: 12 g/dL — ABNORMAL LOW (ref 13.0–17.1)
LYMPH#: 0.8 10*3/uL — ABNORMAL LOW (ref 0.9–3.3)
LYMPH%: 12.8 % — ABNORMAL LOW (ref 14.0–48.0)
MCH: 32.7 pg (ref 28.0–33.4)
MCHC: 33 g/dL (ref 32.0–35.9)
MCV: 99 fL — ABNORMAL HIGH (ref 82–98)
MONO#: 0.6 10*3/uL (ref 0.1–0.9)
MONO%: 10.3 % (ref 0.0–13.0)
NEUT#: 4.3 10*3/uL (ref 1.5–6.5)
NEUT%: 72.1 % (ref 40.0–80.0)
Platelets: 228 10*3/uL (ref 145–400)
RBC: 3.67 10*6/uL — ABNORMAL LOW (ref 4.20–5.70)
RDW: 13.4 % (ref 11.1–15.7)
WBC: 5.9 10*3/uL (ref 4.0–10.0)

## 2016-05-25 LAB — CMP (CANCER CENTER ONLY)
ALT(SGPT): 19 U/L (ref 10–47)
AST: 20 U/L (ref 11–38)
Albumin: 3.3 g/dL (ref 3.3–5.5)
Alkaline Phosphatase: 68 U/L (ref 26–84)
BUN, Bld: 18 mg/dL (ref 7–22)
CO2: 30 mEq/L (ref 18–33)
Calcium: 9.2 mg/dL (ref 8.0–10.3)
Chloride: 103 mEq/L (ref 98–108)
Creat: 1.1 mg/dl (ref 0.6–1.2)
Glucose, Bld: 111 mg/dL (ref 73–118)
Potassium: 3.9 mEq/L (ref 3.3–4.7)
Sodium: 140 mEq/L (ref 128–145)
Total Bilirubin: 0.7 mg/dl (ref 0.20–1.60)
Total Protein: 6.6 g/dL (ref 6.4–8.1)

## 2016-05-25 MED ORDER — DENOSUMAB 120 MG/1.7ML ~~LOC~~ SOLN
120.0000 mg | Freq: Once | SUBCUTANEOUS | Status: AC
  Administered 2016-05-25: 120 mg via SUBCUTANEOUS
  Filled 2016-05-25: qty 1.7

## 2016-05-25 MED ORDER — LEUPROLIDE ACETATE (3 MONTH) 22.5 MG IM KIT
22.5000 mg | PACK | Freq: Once | INTRAMUSCULAR | Status: AC
  Administered 2016-05-25: 22.5 mg via INTRAMUSCULAR
  Filled 2016-05-25: qty 22.5

## 2016-05-25 NOTE — Patient Instructions (Signed)
Leuprolide injection What is this medicine? LEUPROLIDE (loo PROE lide) is a man-made hormone. It is used to treat the symptoms of prostate cancer. This medicine may also be used to treat children with early onset of puberty. It may be used for other hormonal conditions. This medicine may be used for other purposes; ask your health care provider or pharmacist if you have questions. COMMON BRAND NAME(S): Lupron What should I tell my health care provider before I take this medicine? They need to know if you have any of these conditions: -diabetes -heart disease or previous heart attack -high blood pressure -high cholesterol -pain or difficulty passing urine -spinal cord metastasis -stroke -tobacco smoker -an unusual or allergic reaction to leuprolide, benzyl alcohol, other medicines, foods, dyes, or preservatives -pregnant or trying to get pregnant -breast-feeding How should I use this medicine? This medicine is for injection under the skin or into a muscle. You will be taught how to prepare and give this medicine. Use exactly as directed. Take your medicine at regular intervals. Do not take your medicine more often than directed. It is important that you put your used needles and syringes in a special sharps container. Do not put them in a trash can. If you do not have a sharps container, call your pharmacist or healthcare provider to get one. A special MedGuide will be given to you by the pharmacist with each prescription and refill. Be sure to read this information carefully each time. Talk to your pediatrician regarding the use of this medicine in children. While this medicine may be prescribed for children as young as 8 years for selected conditions, precautions do apply. Overdosage: If you think you have taken too much of this medicine contact a poison control center or emergency room at once. NOTE: This medicine is only for you. Do not share this medicine with others. What if I miss a  dose? If you miss a dose, take it as soon as you can. If it is almost time for your next dose, take only that dose. Do not take double or extra doses. What may interact with this medicine? Do not take this medicine with any of the following medications: -chasteberry This medicine may also interact with the following medications: -herbal or dietary supplements, like black cohosh or DHEA -male hormones, like estrogens or progestins and birth control pills, patches, rings, or injections -male hormones, like testosterone This list may not describe all possible interactions. Give your health care provider a list of all the medicines, herbs, non-prescription drugs, or dietary supplements you use. Also tell them if you smoke, drink alcohol, or use illegal drugs. Some items may interact with your medicine. What should I watch for while using this medicine? Visit your doctor or health care professional for regular checks on your progress. During the first week, your symptoms may get worse, but then will improve as you continue your treatment. You may get hot flashes, increased bone pain, increased difficulty passing urine, or an aggravation of nerve symptoms. Discuss these effects with your doctor or health care professional, some of them may improve with continued use of this medicine. Male patients may experience a menstrual cycle or spotting during the first 2 months of therapy with this medicine. If this continues, contact your doctor or health care professional. What side effects may I notice from receiving this medicine? Side effects that you should report to your doctor or health care professional as soon as possible: -allergic reactions like skin rash, itching or  hives, swelling of the face, lips, or tongue -breathing problems -chest pain -depression or memory disorders -pain in your legs or groin -pain at site where injected -severe headache -swelling of the feet and legs -visual  changes -vomiting Side effects that usually do not require medical attention (report to your doctor or health care professional if they continue or are bothersome): -breast swelling or tenderness -decrease in sex drive or performance -diarrhea -hot flashes -loss of appetite -muscle, joint, or bone pains -nausea -redness or irritation at site where injected -skin problems or acne This list may not describe all possible side effects. Call your doctor for medical advice about side effects. You may report side effects to FDA at 1-800-FDA-1088. Where should I keep my medicine? Keep out of the reach of children. Store below 25 degrees C (77 degrees F). Do not freeze. Protect from light. Do not use if it is not clear or if there are particles present. Throw away any unused medicine after the expiration date. NOTE: This sheet is a summary. It may not cover all possible information. If you have questions about this medicine, talk to your doctor, pharmacist, or health care provider.  2018 Elsevier/Gold Standard (2015-08-22 10:54:35) Denosumab injection What is this medicine? DENOSUMAB (den oh sue mab) slows bone breakdown. Prolia is used to treat osteoporosis in women after menopause and in men. Xgeva is used to treat a high calcium level due to cancer and to prevent bone fractures and other bone problems caused by multiple myeloma or cancer bone metastases. Xgeva is also used to treat giant cell tumor of the bone. This medicine may be used for other purposes; ask your health care provider or pharmacist if you have questions. COMMON BRAND NAME(S): Prolia, XGEVA What should I tell my health care provider before I take this medicine? They need to know if you have any of these conditions: -dental disease -having surgery or tooth extraction -infection -kidney disease -low levels of calcium or Vitamin D in the blood -malnutrition -on hemodialysis -skin conditions or sensitivity -thyroid or  parathyroid disease -an unusual reaction to denosumab, other medicines, foods, dyes, or preservatives -pregnant or trying to get pregnant -breast-feeding How should I use this medicine? This medicine is for injection under the skin. It is given by a health care professional in a hospital or clinic setting. If you are getting Prolia, a special MedGuide will be given to you by the pharmacist with each prescription and refill. Be sure to read this information carefully each time. For Prolia, talk to your pediatrician regarding the use of this medicine in children. Special care may be needed. For Xgeva, talk to your pediatrician regarding the use of this medicine in children. While this drug may be prescribed for children as young as 13 years for selected conditions, precautions do apply. Overdosage: If you think you have taken too much of this medicine contact a poison control center or emergency room at once. NOTE: This medicine is only for you. Do not share this medicine with others. What if I miss a dose? It is important not to miss your dose. Call your doctor or health care professional if you are unable to keep an appointment. What may interact with this medicine? Do not take this medicine with any of the following medications: -other medicines containing denosumab This medicine may also interact with the following medications: -medicines that lower your chance of fighting infection -steroid medicines like prednisone or cortisone This list may not describe all possible interactions.   Give your health care provider a list of all the medicines, herbs, non-prescription drugs, or dietary supplements you use. Also tell them if you smoke, drink alcohol, or use illegal drugs. Some items may interact with your medicine. What should I watch for while using this medicine? Visit your doctor or health care professional for regular checks on your progress. Your doctor or health care professional may order  blood tests and other tests to see how you are doing. Call your doctor or health care professional for advice if you get a fever, chills or sore throat, or other symptoms of a cold or flu. Do not treat yourself. This drug may decrease your body's ability to fight infection. Try to avoid being around people who are sick. You should make sure you get enough calcium and vitamin D while you are taking this medicine, unless your doctor tells you not to. Discuss the foods you eat and the vitamins you take with your health care professional. See your dentist regularly. Brush and floss your teeth as directed. Before you have any dental work done, tell your dentist you are receiving this medicine. Do not become pregnant while taking this medicine or for 5 months after stopping it. Talk with your doctor or health care professional about your birth control options while taking this medicine. Women should inform their doctor if they wish to become pregnant or think they might be pregnant. There is a potential for serious side effects to an unborn child. Talk to your health care professional or pharmacist for more information. What side effects may I notice from receiving this medicine? Side effects that you should report to your doctor or health care professional as soon as possible: -allergic reactions like skin rash, itching or hives, swelling of the face, lips, or tongue -bone pain -breathing problems -dizziness -jaw pain, especially after dental work -redness, blistering, peeling of the skin -signs and symptoms of infection like fever or chills; cough; sore throat; pain or trouble passing urine -signs of low calcium like fast heartbeat, muscle cramps or muscle pain; pain, tingling, numbness in the hands or feet; seizures -unusual bleeding or bruising -unusually weak or tired Side effects that usually do not require medical attention (report to your doctor or health care professional if they continue or are  bothersome): -constipation -diarrhea -headache -joint pain -loss of appetite -muscle pain -runny nose -tiredness -upset stomach This list may not describe all possible side effects. Call your doctor for medical advice about side effects. You may report side effects to FDA at 1-800-FDA-1088. Where should I keep my medicine? This medicine is only given in a clinic, doctor's office, or other health care setting and will not be stored at home. NOTE: This sheet is a summary. It may not cover all possible information. If you have questions about this medicine, talk to your doctor, pharmacist, or health care provider.  2018 Elsevier/Gold Standard (2016-01-24 19:17:21)

## 2016-05-25 NOTE — Progress Notes (Signed)
Hematology and Oncology Follow Up Visit  Willmer Fellers [id number] 1945/05/11 71 y.o. 05/25/2016   Principle Diagnosis:   Metastatic castrate resistant prostate cancer  Current Therapy:    Zytiga 1000 mg by mouth daily  Xgeva 120 mg subcutaneous Q3 months - due in  August  Lupron 22 mg IM every 3 months - due in August  Radium-223 therapy -- s/p cycle #6  Palliative radiation to the right sacrum     Interim History:  Mr.  Voorhis is back for followup. he doesn't feel as well. I'm not sure exactly what it might be going on. He says that he just has had some fatigue. He's had decreased stamina. He's not been able to work as much.  He feels as if his cancer might be growing.  His last PET scan that was done about 6 weeks ago did not seem to show any obvious active cancer. He has some pulmonary nodules which were too small to identify.  His PSA has been less than 0.1. This, as was been low.  His heart has not caused any problems from his point of view.   He has had no fever. He has not noted any rashes. He has not noted any subcutaneous nodules or swollen lymph nodes.    Overall, his performance status is ECOG 0.    Medications:  Current Outpatient Prescriptions:  .  acetaminophen (TYLENOL) 500 MG tablet, Take 1,000 mg by mouth every 4 (four) hours as needed for moderate pain., Disp: , Rfl:  .  aspirin 81 MG tablet, Take 81 mg by mouth daily., Disp: , Rfl:  .  Calcium Carbonate-Vitamin D (CALCIUM + D PO), Take 770 mg by mouth 2 (two) times daily. , Disp: , Rfl:  .  gabapentin (NEURONTIN) 300 MG capsule, TAKE 1 CAPSULE BY MOUTH TWICE A DAY THEN TAKE 2 CAPSULES AT BEDTIME, Disp: 120 capsule, Rfl: 4 .  HYDROmorphone (DILAUDID) 4 MG tablet, Take 1 tablet (4 mg total) by mouth every 6 (six) hours as needed for severe pain., Disp: 20 tablet, Rfl: 0 .  ipratropium (ATROVENT) 0.06 % nasal spray, Place 1 spray into both nostrils 4 (four) times daily., Disp: , Rfl:  .  isosorbide  mononitrate (IMDUR) 30 MG 24 hr tablet, Take 30 mg by mouth daily., Disp: , Rfl:  .  metFORMIN (GLUCOPHAGE-XR) 500 MG 24 hr tablet, Take 2 tablets (1,000 mg total) by mouth daily with breakfast., Disp: 180 tablet, Rfl: 2 .  ondansetron (ZOFRAN ODT) 4 MG disintegrating tablet, Take 1 tablet (4 mg total) by mouth every 8 (eight) hours as needed for nausea or vomiting., Disp: 12 tablet, Rfl: 2 .  OVER THE COUNTER MEDICATION, every morning. Juice Plus -- 8 capsule of each the garden, vineyard, and orchead, Disp: , Rfl:  .  predniSONE (DELTASONE) 5 MG tablet, TAKE 1 TABLET BY MOUTH DAILY, Disp: 60 tablet, Rfl: 3 .  prochlorperazine (COMPAZINE) 10 MG tablet, Take 1 tablet (10 mg total) by mouth every 6 (six) hours as needed for nausea or vomiting., Disp: 30 tablet, Rfl: 0 .  traMADol (ULTRAM) 50 MG tablet, Take 1 tablet (50 mg total) by mouth every 6 (six) hours as needed., Disp: 90 tablet, Rfl: 2 .  ZETIA 10 MG tablet, 10 mg daily., Disp: , Rfl:  .  ZYTIGA 250 MG tablet, TAKE 4 TABLETS BY MOUTH DAILY ON AN EMPTY STOMACH 1 HOUR BEFORE OR 2 HOURS AFTER A MEAL, Disp: 120 tablet, Rfl: 3 No current facility-administered medications  for this visit.   Facility-Administered Medications Ordered in Other Visits:  .  denosumab (XGEVA) injection 120 mg, 120 mg, Subcutaneous, Once, Ennever, Rudell Cobb, MD .  leuprolide (LUPRON) injection 22.5 mg, 22.5 mg, Intramuscular, Once, Ennever, Rudell Cobb, MD  Allergies:  Allergies  Allergen Reactions  . Codeine Nausea And Vomiting  . Hydrocodone Nausea And Vomiting    Past Medical History, Surgical history, Social history, and Family History were reviewed and updated.  Review of Systems: As above  Physical Exam:  weight is 155 lb 1.9 oz (70.4 kg). His oral temperature is 98.4 F (36.9 C). His blood pressure is 93/57 (abnormal) and his pulse is 95. His respiration is 18 and oxygen saturation is 100%.   Well-developed and well-nourished white male. Head and neck exam  shows no ocular or oral lesions. The are no palpable cervical or supraclavicular lymph nodes. Lungs are clear with no rales, wheezes or rhonchi. Cardiac exam regular rate and rhythm with no murmurs rubs or bruits. Abdomen is soft. He has good bowel sounds. There is no fluid wave. There is no palpable liver or spleen tip. Back exam shows no tenderness over the spine ribs or hips. I cannot elicit any tenderness over the sacrum bilaterally. He has good range of motion of his pelvis. Extremities shows no clubbing, cyanosis or edema. Skin exam shows areas of urticaria on the left forearm.. There is also an area as solitary in the upper chest wall in the upper sternal region. These are nontender. There are erythematous with a central area of vesicle. No erythema to the skin is noted. Neurological exam is nonfocal.   Lab Results  Component Value Date   WBC 5.9 05/25/2016   HGB 12.0 (L) 05/25/2016   HCT 36.4 (L) 05/25/2016   MCV 99 (H) 05/25/2016   PLT 228 05/25/2016     Chemistry      Component Value Date/Time   NA 140 05/25/2016 1043   NA 141 10/14/2015 0803   K 3.9 05/25/2016 1043   K 4.2 10/14/2015 0803   CL 103 05/25/2016 1043   CO2 30 05/25/2016 1043   CO2 27 10/14/2015 0803   BUN 18 05/25/2016 1043   BUN 17.9 10/14/2015 0803   CREATININE 1.1 05/25/2016 1043   CREATININE 1.2 10/14/2015 0803      Component Value Date/Time   CALCIUM 9.2 05/25/2016 1043   CALCIUM 10.0 10/14/2015 0803   ALKPHOS 68 05/25/2016 1043   ALKPHOS 78 10/14/2015 0803   AST 20 05/25/2016 1043   AST 17 10/14/2015 0803   ALT 19 05/25/2016 1043   ALT 12 10/14/2015 0803   BILITOT 0.70 05/25/2016 1043   BILITOT 0.45 10/14/2015 0803         Impression and Plan: Mr. Wojtaszek is 71 year old gentleman with metastatic prostate cancer.   He has completed his Xofigo. He completed external beam radiation therapy.  IThink that we are probably going to have to get another PET scan on him. I'm not sure why he does not  feel all that well.  Unfortunately, his PSA is just not a good indicator for his prostate cancer.  We will see about getting the PET scan on him.  He will get his Lupron today. He will also get Xgeva.  I will plan to see him back in 6 weeks. . I spent about 35 minutes with he and his wife.    [name], MD 5/11/20182:47 PM

## 2016-05-26 LAB — PSA: Prostate Specific Ag, Serum: <0.1 ng/mL (ref 0.0–4.0)

## 2016-05-28 ENCOUNTER — Encounter: Payer: Self-pay | Admitting: *Deleted

## 2016-06-14 ENCOUNTER — Telehealth: Payer: Self-pay | Admitting: *Deleted

## 2016-06-14 NOTE — Telephone Encounter (Signed)
Patient has a PET scan scheduled for next week. He is currently in appeals with his insurance because they have denied payment on his last scan. Until this issue is resolved, he doesn't want to have another scan and risk non-payment again. He would like to cancel the PET for now.  Reviewed with Dr Marin Olp. He is fine with the patient cancelling the PET until the insurance issues can be resolved.  Patient aware. Message sent to scheduler to cancel PET scan.

## 2016-06-18 MED FILL — ZYTIGA 250 MG TABLET: 250 | 30 days supply | Qty: 120 | Fill #1

## 2016-06-18 MED FILL — predniSONE 5 MG TABS: 5 | 60 days supply | Qty: 60 | Fill #3

## 2016-06-21 ENCOUNTER — Encounter (HOSPITAL_COMMUNITY): Payer: Medicare Other

## 2016-06-22 ENCOUNTER — Ambulatory Visit (HOSPITAL_BASED_OUTPATIENT_CLINIC_OR_DEPARTMENT_OTHER): Payer: Medicare Other | Admitting: Hematology & Oncology

## 2016-06-22 ENCOUNTER — Other Ambulatory Visit (HOSPITAL_BASED_OUTPATIENT_CLINIC_OR_DEPARTMENT_OTHER): Payer: Medicare Other

## 2016-06-22 VITALS — BP 108/64 | HR 88 | Temp 98.0°F | Resp 16 | Wt 157.0 lb

## 2016-06-22 DIAGNOSIS — C7951 Secondary malignant neoplasm of bone: Secondary | ICD-10-CM

## 2016-06-22 DIAGNOSIS — C61 Malignant neoplasm of prostate: Secondary | ICD-10-CM | POA: Diagnosis not present

## 2016-06-22 LAB — CMP (CANCER CENTER ONLY)
ALT(SGPT): 25 U/L (ref 10–47)
AST: 21 U/L (ref 11–38)
Albumin: 3.6 g/dL (ref 3.3–5.5)
Alkaline Phosphatase: 64 U/L (ref 26–84)
BUN, Bld: 16 mg/dL (ref 7–22)
CO2: 31 mEq/L (ref 18–33)
Calcium: 9.6 mg/dL (ref 8.0–10.3)
Chloride: 103 mEq/L (ref 98–108)
Creat: 1.3 mg/dl — ABNORMAL HIGH (ref 0.6–1.2)
Glucose, Bld: 119 mg/dL — ABNORMAL HIGH (ref 73–118)
Potassium: 4 mEq/L (ref 3.3–4.7)
Sodium: 141 mEq/L (ref 128–145)
Total Bilirubin: 0.6 mg/dl (ref 0.20–1.60)
Total Protein: 6.8 g/dL (ref 6.4–8.1)

## 2016-06-22 LAB — CBC WITH DIFFERENTIAL (CANCER CENTER ONLY)
BASO#: 0 10*3/uL (ref 0.0–0.2)
BASO%: 0.3 % (ref 0.0–2.0)
EOS%: 3 % (ref 0.0–7.0)
Eosinophils Absolute: 0.2 10*3/uL (ref 0.0–0.5)
HCT: 38.3 % — ABNORMAL LOW (ref 38.7–49.9)
HGB: 12.6 g/dL — ABNORMAL LOW (ref 13.0–17.1)
LYMPH#: 1 10*3/uL (ref 0.9–3.3)
LYMPH%: 14.1 % (ref 14.0–48.0)
MCH: 32.5 pg (ref 28.0–33.4)
MCHC: 32.9 g/dL (ref 32.0–35.9)
MCV: 99 fL — ABNORMAL HIGH (ref 82–98)
MONO#: 0.6 10*3/uL (ref 0.1–0.9)
MONO%: 8.9 % (ref 0.0–13.0)
NEUT#: 5.1 10*3/uL (ref 1.5–6.5)
NEUT%: 73.7 % (ref 40.0–80.0)
Platelets: 265 10*3/uL (ref 145–400)
RBC: 3.88 10*6/uL — ABNORMAL LOW (ref 4.20–5.70)
RDW: 12.8 % (ref 11.1–15.7)
WBC: 7 10*3/uL (ref 4.0–10.0)

## 2016-06-22 NOTE — Progress Notes (Signed)
Hematology and Oncology Follow Up Visit  [name] [id number] 12/15/1945 71 y.o. 06/22/2016   Principle Diagnosis:   Metastatic castrate resistant prostate cancer  Current Therapy:    Zytiga 1000 mg by mouth daily  Xgeva 120 mg subcutaneous Q3 months - due in  August  Lupron 22 mg IM every 3 months - due in August  Radium-223 therapy -- s/p cycle #6  Palliative radiation to the right sacrum     Interim History:  [name] is back for followup. He looks a lot better. He is still working. He has been very busy at work.  For some reason, the insurance company would not approve the new prostate cancer specific PET scan. This is really really helpful because he has these pulmonary nodules. His PSA has really never been an indicator for [place] that we can use.  He has a lipid of a cough. It is non-productive.  He's had no weight loss or weight gain. He's had no bony pain. Every now and then, he does have some discomfort in his right hip.  Is no change in bowel or bladder habits.  His blood sugars have been doing pretty well.   Overall, his performance status is ECOG 0.    Medications:  Current Outpatient Prescriptions:  .  acetaminophen (TYLENOL) 500 MG tablet, Take 1,000 mg by mouth every 4 (four) hours as needed for moderate pain., Disp: , Rfl:  .  aspirin 81 MG tablet, Take 81 mg by mouth daily., Disp: , Rfl:  .  Calcium Carbonate-Vitamin D (CALCIUM + D PO), Take 770 mg by mouth 2 (two) times daily. , Disp: , Rfl:  .  gabapentin (NEURONTIN) 300 MG capsule, TAKE 1 CAPSULE BY MOUTH TWICE A DAY THEN TAKE 2 CAPSULES AT BEDTIME, Disp: 120 capsule, Rfl: 4 .  HYDROmorphone (DILAUDID) 4 MG tablet, Take 1 tablet (4 mg total) by mouth every 6 (six) hours as needed for severe pain., Disp: 20 tablet, Rfl: 0 .  ipratropium (ATROVENT) 0.06 % nasal spray, Place 1 spray into both nostrils 4 (four) times daily., Disp: , Rfl:  .  isosorbide mononitrate (IMDUR) 30 MG 24 hr tablet, Take 30 mg  by mouth daily., Disp: , Rfl:  .  metFORMIN (GLUCOPHAGE-XR) 500 MG 24 hr tablet, Take 2 tablets (1,000 mg total) by mouth daily with breakfast., Disp: 180 tablet, Rfl: 2 .  ondansetron (ZOFRAN ODT) 4 MG disintegrating tablet, Take 1 tablet (4 mg total) by mouth every 8 (eight) hours as needed for nausea or vomiting., Disp: 12 tablet, Rfl: 2 .  OVER THE COUNTER MEDICATION, every morning. Juice Plus -- 8 capsule of each the garden, vineyard, and orchead, Disp: , Rfl:  .  predniSONE (DELTASONE) 5 MG tablet, TAKE 1 TABLET BY MOUTH DAILY, Disp: 60 tablet, Rfl: 3 .  prochlorperazine (COMPAZINE) 10 MG tablet, Take 1 tablet (10 mg total) by mouth every 6 (six) hours as needed for nausea or vomiting., Disp: 30 tablet, Rfl: 0 .  traMADol (ULTRAM) 50 MG tablet, Take 1 tablet (50 mg total) by mouth every 6 (six) hours as needed., Disp: 90 tablet, Rfl: 2 .  ZETIA 10 MG tablet, 10 mg daily., Disp: , Rfl:  .  ZYTIGA 250 MG tablet, TAKE 4 TABLETS BY MOUTH DAILY ON AN EMPTY STOMACH 1 HOUR BEFORE OR 2 HOURS AFTER A MEAL, Disp: 120 tablet, Rfl: 3  Allergies:  Allergies  Allergen Reactions  . Codeine Nausea And Vomiting  . Hydrocodone Nausea And Vomiting  Past Medical History, Surgical history, Social history, and Family History were reviewed and updated.  Review of Systems: As above  Physical Exam:  weight is 157 lb (71.2 kg). His oral temperature is 98 F (36.7 C). His blood pressure is 108/64 and his pulse is 88. His respiration is 16 and oxygen saturation is 100%.   Well-developed and well-nourished white male. Head and neck exam shows no ocular or oral lesions. The are no palpable cervical or supraclavicular lymph nodes. Lungs are clear with no rales, wheezes or rhonchi. Cardiac exam regular rate and rhythm with no murmurs rubs or bruits. Abdomen is soft. He has good bowel sounds. There is no fluid wave. There is no palpable liver or spleen tip. Back exam shows no tenderness over the spine ribs or hips.  I cannot elicit any tenderness over the sacrum bilaterally. He has good range of motion of his pelvis. Extremities shows no clubbing, cyanosis or edema. Skin exam shows areas of urticaria on the left forearm.. There is also an area as solitary in the upper chest wall in the upper sternal region. These are nontender. There are erythematous with a central area of vesicle. No erythema to the skin is noted. Neurological exam is nonfocal.   Lab Results  Component Value Date   WBC 7.0 06/22/2016   HGB 12.6 (L) 06/22/2016   HCT 38.3 (L) 06/22/2016   MCV 99 (H) 06/22/2016   PLT 265 06/22/2016     Chemistry      Component Value Date/Time   NA 141 06/22/2016 1320   NA 141 10/14/2015 0803   K 4.0 06/22/2016 1320   K 4.2 10/14/2015 0803   CL 103 06/22/2016 1320   CO2 31 06/22/2016 1320   CO2 27 10/14/2015 0803   BUN 16 06/22/2016 1320   BUN 17.9 10/14/2015 0803   CREATININE 1.3 (H) 06/22/2016 1320   CREATININE 1.2 10/14/2015 0803      Component Value Date/Time   CALCIUM 9.6 06/22/2016 1320   CALCIUM 10.0 10/14/2015 0803   ALKPHOS 64 06/22/2016 1320   ALKPHOS 78 10/14/2015 0803   AST 21 06/22/2016 1320   AST 17 10/14/2015 0803   ALT 25 06/22/2016 1320   ALT 12 10/14/2015 0803   BILITOT 0.60 06/22/2016 1320   BILITOT 0.45 10/14/2015 0803         Impression and Plan: [name] is 71 year old gentleman with metastatic prostate cancer.   It is unfortunate that we could not get the PET scan. The new prostate cancer pacific PET scans are very helpful. Given that his PSA really does not rise, we really need the PET scan to let us know how things are going with his treatments.   Hopefully, we will be able to get the PET scan in a couple weeks.   I will see him back in another 6 weeks. When we see him back, he will get his Delton See and Lupron injections   t about 35 minutes with he, his sister and his wife.    Michael Napoleon, MD 6/8/20183:38 PM

## 2016-06-23 LAB — PSA: Prostate Specific Ag, Serum: <0.1 ng/mL (ref 0.0–4.0)

## 2016-07-16 ENCOUNTER — Other Ambulatory Visit: Payer: Self-pay | Admitting: Hematology & Oncology

## 2016-07-16 MED FILL — ZYTIGA 250 MG TABLET: 250 | 30 days supply | Qty: 120 | Fill #2

## 2016-07-16 MED FILL — GABAPENTIN 300 MG CAPSULE: 300 | 30 days supply | Qty: 120 | Fill #2

## 2016-07-16 MED FILL — ISOSORBIDE MN ER 30 MG TAB: 30 | 90 days supply | Qty: 90 | Fill #1

## 2016-07-17 MED FILL — VESIcare 5 MG TABS: 5 | 30 days supply | Qty: 30 | Fill #0

## 2016-07-23 ENCOUNTER — Other Ambulatory Visit: Payer: Self-pay | Admitting: Family

## 2016-07-23 ENCOUNTER — Ambulatory Visit: Payer: Medicare Other | Admitting: Family

## 2016-07-23 ENCOUNTER — Ambulatory Visit (HOSPITAL_BASED_OUTPATIENT_CLINIC_OR_DEPARTMENT_OTHER)
Admission: RE | Admit: 2016-07-23 | Discharge: 2016-07-23 | Disposition: A | Discharge status: Home / Self Care | Payer: Medicare Other | Source: Ambulatory Visit | Attending: Family | Admitting: Family

## 2016-07-23 ENCOUNTER — Ambulatory Visit (HOSPITAL_BASED_OUTPATIENT_CLINIC_OR_DEPARTMENT_OTHER): Payer: Medicare Other | Admitting: Family

## 2016-07-23 VITALS — BP 134/78 | HR 78 | Temp 98.2°F | Resp 16 | Wt 163.0 lb

## 2016-07-23 DIAGNOSIS — C7951 Secondary malignant neoplasm of bone: Secondary | ICD-10-CM

## 2016-07-23 DIAGNOSIS — C61 Malignant neoplasm of prostate: Secondary | ICD-10-CM

## 2016-07-23 DIAGNOSIS — M79651 Pain in right thigh: Secondary | ICD-10-CM | POA: Diagnosis not present

## 2016-07-23 DIAGNOSIS — G893 Neoplasm related pain (acute) (chronic): Secondary | ICD-10-CM | POA: Diagnosis not present

## 2016-07-23 NOTE — Progress Notes (Signed)
Hematology and Oncology Follow Up Visit  [name] [id number] 13-Sep-1945 71 y.o. 07/23/2016   Principle Diagnosis:  Metastatic castrate resistant prostate cancer  Current Therapy:   Zytiga 1000 mg by mouth daily Xgeva 120 mg subcutaneous Q3 months - due in  August Lupron 22 mg IM every 3 months - due in August  Past Therapy: Radium-223 therapy -- s/p cycle #6 Palliative radiation to the right sacrum   Interim History:  [name] is here today with his wife for an unscheduled visit. He c/o a "burning" sensation in the right thigh and groin. He has also stated that he has had numbness and tingling in the thigh as well. This has improved slightly but now he is having pain in his right knee. There is no swelling or redness present at the site. He is a little tender in the back of his right thigh and knee when manipulated during exam.  He has had no loss of bowel or bladder function.  He tried taking his dilaudid on an empty stomach and became nauseated and vomited once. He has been taking Tylenol and Ultram every 3 hours as needed for the pain.  He denies falls or syncope. He has been wearing a right knee brace as needed for support. He does feel weak in his legs but states that they have not "give out" on him.  He has not been sleeping well due to all this.  No fever, chills, n/v, cough, rash, dizziness, chest pain, palpitations, abdominal pain or changes in bowel or bladder habits.  He has occasional SOB with over exertion and will take breaks to rest as needed. He has maintained a good appetite and is staying hydrated. His weight is up 6 lbs since his last visit.   ECOG Performance Status: 1 - Symptomatic but completely ambulatory  Medications:  Allergies as of 07/23/2016      Reactions   Codeine Nausea And Vomiting   Hydrocodone Nausea And Vomiting      Medication List       Accurate as of 07/23/16 11:42 AM. Always use your most recent med list.          acetaminophen 500  MG tablet Commonly known as:  TYLENOL Take 1,000 mg by mouth every 4 (four) hours as needed for moderate pain.   aspirin 81 MG tablet Take 81 mg by mouth daily.   CALCIUM + D PO Take 770 mg by mouth 2 (two) times daily.   gabapentin 300 MG capsule Commonly known as:  NEURONTIN TAKE 1 CAPSULE BY MOUTH TWICE A DAY THEN TAKE 2 CAPSULES AT BEDTIME   HYDROmorphone 4 MG tablet Commonly known as:  DILAUDID Take 1 tablet (4 mg total) by mouth every 6 (six) hours as needed for severe pain.   ipratropium 0.06 % nasal spray Commonly known as:  ATROVENT Place 1 spray into both nostrils 4 (four) times daily.   isosorbide mononitrate 30 MG 24 hr tablet Commonly known as:  IMDUR Take 30 mg by mouth daily.   metFORMIN 500 MG 24 hr tablet Commonly known as:  GLUCOPHAGE-XR Take 2 tablets (1,000 mg total) by mouth daily with breakfast.   ondansetron 4 MG disintegrating tablet Commonly known as:  ZOFRAN ODT Take 1 tablet (4 mg total) by mouth every 8 (eight) hours as needed for nausea or vomiting.   OVER THE COUNTER MEDICATION every morning. Juice Plus -- 8 capsule of each the garden, vineyard, and orchead   predniSONE 5 MG tablet  Commonly known as:  DELTASONE TAKE 1 TABLET BY MOUTH DAILY   prochlorperazine 10 MG tablet Commonly known as:  COMPAZINE Take 1 tablet (10 mg total) by mouth every 6 (six) hours as needed for nausea or vomiting.   traMADol 50 MG tablet Commonly known as:  ULTRAM Take 1 tablet (50 mg total) by mouth every 6 (six) hours as needed.   VESICARE 5 MG tablet Generic drug:  solifenacin TAKE 1 TABLET BY MOUTH DAILY.   ZETIA 10 MG tablet Generic drug:  ezetimibe 10 mg daily.   ZYTIGA 250 MG tablet Generic drug:  abiraterone Acetate TAKE 4 TABLETS BY MOUTH DAILY ON AN EMPTY STOMACH 1 HOUR BEFORE OR 2 HOURS AFTER A MEAL       Allergies:  Allergies  Allergen Reactions  . Codeine Nausea And Vomiting  . Hydrocodone Nausea And Vomiting    Past Medical  History, Surgical history, Social history, and Family History were reviewed and updated.  Review of Systems: All other 10 point review of systems is negative.   Physical Exam:  vitals were not taken for this visit.  Wt Readings from Last 3 Encounters:  06/22/16 157 lb (71.2 kg)  05/25/16 155 lb 1.9 oz (70.4 kg)  04/25/16 154 lb 6.4 oz (70 kg)    Ocular: Sclerae unicteric, pupils equal, round and reactive to light Ear-nose-throat: Oropharynx clear, dentition fair Lymphatic: No cervical, supraclavicular or axillary adenopathy Lungs no rales or rhonchi, good excursion bilaterally Heart regular rate and rhythm, no murmur appreciated Abd soft, nontender, positive bowel sounds, no liver or spleen tip palpated on exam, no fluid wave MSK no focal spinal tenderness, no joint edema Neuro: non-focal, well-oriented, appropriate affect Breasts: Deferred  Lab Results  Component Value Date   WBC 7.0 06/22/2016   HGB 12.6 (L) 06/22/2016   HCT 38.3 (L) 06/22/2016   MCV 99 (H) 06/22/2016   PLT 265 06/22/2016   No results found for: FERRITIN, IRON, TIBC, UIBC, IRONPCTSAT Lab Results  Component Value Date   RBC 3.88 (L) 06/22/2016   No results found for: KPAFRELGTCHN, LAMBDASER, KAPLAMBRATIO No results found for: Kandis Cocking, IGMSERUM No results found for: Odetta Pink, SPEI   Chemistry      Component Value Date/Time   NA 141 06/22/2016 1320   NA 141 10/14/2015 0803   K 4.0 06/22/2016 1320   K 4.2 10/14/2015 0803   CL 103 06/22/2016 1320   CO2 31 06/22/2016 1320   CO2 27 10/14/2015 0803   BUN 16 06/22/2016 1320   BUN 17.9 10/14/2015 0803   CREATININE 1.3 (H) 06/22/2016 1320   CREATININE 1.2 10/14/2015 0803      Component Value Date/Time   CALCIUM 9.6 06/22/2016 1320   CALCIUM 10.0 10/14/2015 0803   ALKPHOS 64 06/22/2016 1320   ALKPHOS 78 10/14/2015 0803   AST 21 06/22/2016 1320   AST 17 10/14/2015 0803   ALT 25  06/22/2016 1320   ALT 12 10/14/2015 0803   BILITOT 0.60 06/22/2016 1320   BILITOT 0.45 10/14/2015 0803      Impression and Plan: [name] is a very pleasant 71 yo caucasian gentleman with metastatic prostate cancer. He is here today with c/o pain and numbness in his right thigh and groin that comes and goes. He has also started to have pain in his right knee.  He is scheduled for an Axumin PET scan on 7/13. We went ahead and got an x-ray of the right  femur which showed known right sacral metastasis and no evidence of femoral metastasis.  We will get an MRI of the pelvis to further evaluate.  Al questions were answered and they are in agreement with the plan.  We will plan to see him back on 7/20 for his scheduled follow-up.  Greater than 50% of the 25 minute face to face visit was spent counseling and coordinating care.  Both he and and his wife know to contact our office with any questions or concerns. We can certainly see him sooner if need be.   Eliezer Bottom, NP 7/9/201811:42 AM

## 2016-07-25 ENCOUNTER — Other Ambulatory Visit: Payer: Self-pay | Admitting: Family

## 2016-07-25 DIAGNOSIS — M79651 Pain in right thigh: Secondary | ICD-10-CM

## 2016-07-25 DIAGNOSIS — C7951 Secondary malignant neoplasm of bone: Secondary | ICD-10-CM

## 2016-07-26 ENCOUNTER — Telehealth: Payer: Self-pay | Admitting: *Deleted

## 2016-07-26 NOTE — Telephone Encounter (Signed)
Patient c/o increased pain in his right thigh. He was seen in the office earlier this week for the same pain. Imaging work up is not yet complete. Patient is alternating dilaudid with tramadol and tylenol. It's not helping.   Reviewed with Laverna Peace NP and she would like patient to continue alternating his medications, but to increase the frequency of his dilaudid to every 4 hours.   Instructed patient and he understands, confirmed with teach back. Patient states he has enough dilaudid at home for the increase and doesn't want a new prescription. Also instructed to call the office tomorrow if the pain doesn't decrease with more frequent medication.

## 2016-07-27 ENCOUNTER — Encounter (HOSPITAL_COMMUNITY)
Admission: RE | Admit: 2016-07-27 | Discharge: 2016-07-27 | Disposition: A | Discharge status: Home / Self Care | Payer: Medicare Other | Source: Ambulatory Visit | Attending: Hematology & Oncology | Admitting: Hematology & Oncology

## 2016-07-27 DIAGNOSIS — C7951 Secondary malignant neoplasm of bone: Secondary | ICD-10-CM | POA: Insufficient documentation

## 2016-07-27 DIAGNOSIS — R918 Other nonspecific abnormal finding of lung field: Secondary | ICD-10-CM | POA: Diagnosis not present

## 2016-07-27 DIAGNOSIS — C61 Malignant neoplasm of prostate: Secondary | ICD-10-CM | POA: Insufficient documentation

## 2016-07-27 MED ORDER — AXUMIN (FLUCICLOVINE F 18) INJECTION
11.3600 | Freq: Once | INTRAVENOUS | Status: AC
  Administered 2016-07-27: 11.36 via INTRAVENOUS

## 2016-07-28 ENCOUNTER — Ambulatory Visit (HOSPITAL_COMMUNITY)
Admission: RE | Admit: 2016-07-28 | Discharge: 2016-07-28 | Disposition: A | Discharge status: Home / Self Care | Payer: Medicare Other | Source: Ambulatory Visit | Attending: Family | Admitting: Family

## 2016-07-28 DIAGNOSIS — C7951 Secondary malignant neoplasm of bone: Secondary | ICD-10-CM | POA: Diagnosis not present

## 2016-07-28 DIAGNOSIS — R1030 Lower abdominal pain, unspecified: Secondary | ICD-10-CM | POA: Diagnosis not present

## 2016-07-28 DIAGNOSIS — C61 Malignant neoplasm of prostate: Secondary | ICD-10-CM | POA: Diagnosis not present

## 2016-07-28 DIAGNOSIS — M79651 Pain in right thigh: Secondary | ICD-10-CM | POA: Diagnosis not present

## 2016-07-28 MED ORDER — GADOBENATE DIMEGLUMINE 529 MG/ML IV SOLN
15.0000 mL | Freq: Once | INTRAVENOUS | Status: AC | PRN
  Administered 2016-07-28: 15 mL via INTRAVENOUS

## 2016-08-03 ENCOUNTER — Other Ambulatory Visit (HOSPITAL_BASED_OUTPATIENT_CLINIC_OR_DEPARTMENT_OTHER): Payer: Medicare Other

## 2016-08-03 ENCOUNTER — Other Ambulatory Visit: Payer: Self-pay | Admitting: Hematology & Oncology

## 2016-08-03 ENCOUNTER — Ambulatory Visit: Payer: Medicare Other

## 2016-08-03 ENCOUNTER — Ambulatory Visit (HOSPITAL_BASED_OUTPATIENT_CLINIC_OR_DEPARTMENT_OTHER): Payer: Medicare Other | Admitting: Hematology & Oncology

## 2016-08-03 VITALS — BP 140/77 | HR 95 | Temp 98.6°F | Resp 17 | Wt 158.0 lb

## 2016-08-03 DIAGNOSIS — C61 Malignant neoplasm of prostate: Secondary | ICD-10-CM

## 2016-08-03 DIAGNOSIS — C7951 Secondary malignant neoplasm of bone: Secondary | ICD-10-CM | POA: Diagnosis not present

## 2016-08-03 LAB — CMP (CANCER CENTER ONLY)
ALT(SGPT): 18 U/L (ref 10–47)
AST: 26 U/L (ref 11–38)
Albumin: 3.5 g/dL (ref 3.3–5.5)
Alkaline Phosphatase: 77 U/L (ref 26–84)
BUN, Bld: 16 mg/dL (ref 7–22)
CO2: 30 mEq/L (ref 18–33)
Calcium: 9.9 mg/dL (ref 8.0–10.3)
Chloride: 102 mEq/L (ref 98–108)
Creat: 1.2 mg/dl (ref 0.6–1.2)
Glucose, Bld: 120 mg/dL — ABNORMAL HIGH (ref 73–118)
Potassium: 3.9 mEq/L (ref 3.3–4.7)
Sodium: 140 mEq/L (ref 128–145)
Total Bilirubin: 0.6 mg/dl (ref 0.20–1.60)
Total Protein: 6.9 g/dL (ref 6.4–8.1)

## 2016-08-03 LAB — CBC WITH DIFFERENTIAL (CANCER CENTER ONLY)
BASO#: 0 10*3/uL (ref 0.0–0.2)
BASO%: 0.5 % (ref 0.0–2.0)
EOS%: 2.3 % (ref 0.0–7.0)
Eosinophils Absolute: 0.2 10*3/uL (ref 0.0–0.5)
HCT: 37.5 % — ABNORMAL LOW (ref 38.7–49.9)
HGB: 12.4 g/dL — ABNORMAL LOW (ref 13.0–17.1)
LYMPH#: 0.9 10*3/uL (ref 0.9–3.3)
LYMPH%: 11.8 % — ABNORMAL LOW (ref 14.0–48.0)
MCH: 32.2 pg (ref 28.0–33.4)
MCHC: 33.1 g/dL (ref 32.0–35.9)
MCV: 97 fL (ref 82–98)
MONO#: 0.4 10*3/uL (ref 0.1–0.9)
MONO%: 5.6 % (ref 0.0–13.0)
NEUT#: 6 10*3/uL (ref 1.5–6.5)
NEUT%: 79.8 % (ref 40.0–80.0)
Platelets: 280 10*3/uL (ref 145–400)
RBC: 3.85 10*6/uL — ABNORMAL LOW (ref 4.20–5.70)
RDW: 12.4 % (ref 11.1–15.7)
WBC: 7.5 10*3/uL (ref 4.0–10.0)

## 2016-08-03 MED ORDER — ENZALUTAMIDE 40 MG PO CAPS: 160.0000 mg | ORAL_CAPSULE | Freq: Every day | ORAL | 4 refills | Status: DC

## 2016-08-03 MED ORDER — PREDNISONE 5 MG PO TABS
5.0000 mg | ORAL_TABLET | Freq: Every day | ORAL | 3 refills | Status: DC
Start: 2016-08-03 — End: 2016-08-09

## 2016-08-03 MED FILL — METFORMIN HCL ER 500 MG TAB: 500 | 90 days supply | Qty: 180 | Fill #0

## 2016-08-03 NOTE — Progress Notes (Signed)
Hematology and Oncology Follow Up Visit  [name] [id number] May 28, 1945 71 y.o. 08/03/2016   Principle Diagnosis:   Metastatic castrate resistant prostate cancer  Current Therapy:    Zytiga 1000 mg by mouth daily - discontinued on 08/15/2016  Xtandi 160 mg po q day - start on 08/15/2016  Xgeva 120 mg subcutaneous Q3 months - due in  August  Lupron 22 mg IM every 3 months - due in August  Radium-223 therapy -- s/p cycle #6  Palliative radiation to the right sacrum     Interim History:  [name] is back for followup. He does not have any pain in the right hip or leg. I suspect this probably is more arthritic.  Unfortunately, his disease appears to be progressing. We did get the new prostate cancer specific PET scan ([facility]). This showed that he had increase in his pulmonary nodules. These had more intense uptake. There was no actual involvement of his bones. There is no adenopathy in the abdomen or pelvis.  His PSA has always been incredibly low. As such, I don't think we can rely on the PSA for an evaluation of response.  He has been on Zytiga for over 2 or 3 years. I think we will have to make a change. I think that Gillermina Phy would be reasonable.  I think a bigger problem however is that he is having some more pulmonary symptoms. He has some shortness of breath when he walks. He has had this before. He has seen his cardiologist. The cardiologist felt that a cardiac cath would be helpful. [name] declined this in the past. I told that I think he must have this at this point.  He is still working.  I told him that he is at high risk for heart disease because he is testosterone depleted.  His blood sugars seem to be doing pretty well.  He's not having any bleeding. There is no diarrhea. He's having no issues with his urine.  On occasion, he does feel a little dizzy. Again I think this might be from his heart.   Overall, his performance status is ECOG 0.    Medications:  Current Outpatient Prescriptions:  .  acetaminophen (TYLENOL) 500 MG tablet, Take 1,000 mg by mouth every 4 (four) hours as needed for moderate pain., Disp: , Rfl:  .  aspirin 81 MG tablet, Take 81 mg by mouth daily., Disp: , Rfl:  .  Calcium Carbonate-Vitamin D (CALCIUM + D PO), Take 770 mg by mouth 2 (two) times daily. , Disp: , Rfl:  .  enzalutamide (XTANDI) 40 MG capsule, Take 4 capsules (160 mg total) by mouth daily., Disp: 120 capsule, Rfl: 4 .  gabapentin (NEURONTIN) 300 MG capsule, TAKE 1 CAPSULE BY MOUTH TWICE A DAY THEN TAKE 2 CAPSULES AT BEDTIME, Disp: 120 capsule, Rfl: 4 .  HYDROmorphone (DILAUDID) 4 MG tablet, Take 1 tablet (4 mg total) by mouth every 6 (six) hours as needed for severe pain., Disp: 20 tablet, Rfl: 0 .  ipratropium (ATROVENT) 0.06 % nasal spray, Place 1 spray into both nostrils 4 (four) times daily., Disp: , Rfl:  .  isosorbide mononitrate (IMDUR) 30 MG 24 hr tablet, Take 30 mg by mouth daily., Disp: , Rfl:  .  metFORMIN (GLUCOPHAGE-XR) 500 MG 24 hr tablet, Take 2 tablets (1,000 mg total) by mouth daily with breakfast., Disp: 180 tablet, Rfl: 2 .  ondansetron (ZOFRAN ODT) 4 MG disintegrating tablet, Take 1 tablet (4 mg total) by mouth  every 8 (eight) hours as needed for nausea or vomiting., Disp: 12 tablet, Rfl: 2 .  OVER THE COUNTER MEDICATION, every morning. Juice Plus -- 8 capsule of each the garden, vineyard, and orchead, Disp: , Rfl:  .  predniSONE (DELTASONE) 5 MG tablet, Take 1 tablet (5 mg total) by mouth daily., Disp: 60 tablet, Rfl: 3 .  prochlorperazine (COMPAZINE) 10 MG tablet, Take 1 tablet (10 mg total) by mouth every 6 (six) hours as needed for nausea or vomiting., Disp: 30 tablet, Rfl: 0 .  traMADol (ULTRAM) 50 MG tablet, Take 1 tablet (50 mg total) by mouth every 6 (six) hours as needed., Disp: 90 tablet, Rfl: 2 .  VESICARE 5 MG tablet, TAKE 1 TABLET BY MOUTH DAILY. (Patient taking differently: TAKE 1 TABLET BY MOUTH TWICE A WEEK), Disp: 30  tablet, Rfl: 4 .  ZETIA 10 MG tablet, 10 mg daily., Disp: , Rfl:   Allergies:  Allergies  Allergen Reactions  . Codeine Nausea And Vomiting  . Hydrocodone Nausea And Vomiting    Past Medical History, Surgical history, Social history, and Family History were reviewed and updated.  Review of Systems: As above  Physical Exam:  weight is 158 lb (71.7 kg). His oral temperature is 98.6 F (37 C). His blood pressure is 140/77 and his pulse is 95. His respiration is 17 and oxygen saturation is 100%.   Well-developed and well-nourished white male. Head and neck exam shows no ocular or oral lesions. The are no palpable cervical or supraclavicular lymph nodes. Lungs are clear with no rales, wheezes or rhonchi. Cardiac exam regular rate and rhythm with no murmurs rubs or bruits. Abdomen is soft. He has good bowel sounds. There is no fluid wave. There is no palpable liver or spleen tip. Back exam shows no tenderness over the spine ribs or hips. I cannot elicit any tenderness over the sacrum bilaterally. He has good range of motion of his pelvis. Extremities shows no clubbing, cyanosis or edema. Skin exam shows areas of urticaria on the left forearm.. There is also an area as solitary in the upper chest wall in the upper sternal region. These are nontender. There are erythematous with a central area of vesicle. No erythema to the skin is noted. Neurological exam is nonfocal.   Lab Results  Component Value Date   WBC 7.5 08/03/2016   HGB 12.4 (L) 08/03/2016   HCT 37.5 (L) 08/03/2016   MCV 97 08/03/2016   PLT 280 08/03/2016     Chemistry      Component Value Date/Time   NA 140 08/03/2016 1345   NA 141 10/14/2015 0803   K 3.9 08/03/2016 1345   K 4.2 10/14/2015 0803   CL 102 08/03/2016 1345   CO2 30 08/03/2016 1345   CO2 27 10/14/2015 0803   BUN 16 08/03/2016 1345   BUN 17.9 10/14/2015 0803   CREATININE 1.2 08/03/2016 1345   CREATININE 1.2 10/14/2015 0803      Component Value Date/Time    CALCIUM 9.9 08/03/2016 1345   CALCIUM 10.0 10/14/2015 0803   ALKPHOS 77 08/03/2016 1345   ALKPHOS 78 10/14/2015 0803   AST 26 08/03/2016 1345   AST 17 10/14/2015 0803   ALT 18 08/03/2016 1345   ALT 12 10/14/2015 0803   BILITOT 0.60 08/03/2016 1345   BILITOT 0.45 10/14/2015 0803         Impression and Plan: [name] is 71 year old gentleman with metastatic prostate cancer.   I am really worried  about his pulmonary symptoms. I really think that he will need to have a cardiac cath. He has seen his cardiologist before. I know that he had this issue about a year or so ago. At that time, a cardiac cath was recommended. [name] declined.  I told him that if his heart gives out on him, it will make a difference what is going on with his metastatic prostate cancer. His wife and sister clearly agree with this.  He is much more amenable now to having a cardiac catheterization.   As far as his prostate cancer is concerned, it is obvious that the PSA is not going to tell us what is going on. The only way we will know if he is responding/progressing is with scans.  We will make a change with the Zytiga. I will switch him over to [facility]. We'll start him on 160 mg daily dose.   Hopefully, we will see that he responds. If not, then we will clearly need to be moving toward chemotherapy.   We spent about 45 minutes with him and his family. We cannot give him the Lupron/Xgeva. We will do this when he comes back in a couple weeks.     [name], MD 7/20/20185:09 PM

## 2016-08-04 LAB — PSA: Prostate Specific Ag, Serum: <0.1 ng/mL (ref 0.0–4.0)

## 2016-08-04 LAB — TESTOSTERONE: Testosterone, Serum: <3 ng/dL — ABNORMAL LOW (ref 264–916)

## 2016-08-06 DIAGNOSIS — Z0000 Encounter for general adult medical examination without abnormal findings: Secondary | ICD-10-CM | POA: Diagnosis not present

## 2016-08-06 DIAGNOSIS — Z1389 Encounter for screening for other disorder: Secondary | ICD-10-CM | POA: Diagnosis not present

## 2016-08-06 DIAGNOSIS — E785 Hyperlipidemia, unspecified: Secondary | ICD-10-CM | POA: Diagnosis not present

## 2016-08-06 DIAGNOSIS — Z9181 History of falling: Secondary | ICD-10-CM | POA: Diagnosis not present

## 2016-08-06 DIAGNOSIS — Z125 Encounter for screening for malignant neoplasm of prostate: Secondary | ICD-10-CM | POA: Diagnosis not present

## 2016-08-06 MED FILL — predniSONE 5 MG TABS: 5 | 60 days supply | Qty: 60 | Fill #0

## 2016-08-06 MED FILL — XTANDI 40 MG CAPSULE: 40 | 30 days supply | Qty: 120 | Fill #0 | Status: TO

## 2016-08-09 ENCOUNTER — Encounter: Payer: Self-pay | Admitting: Cardiology

## 2016-08-09 ENCOUNTER — Ambulatory Visit (INDEPENDENT_AMBULATORY_CARE_PROVIDER_SITE_OTHER): Payer: Medicare Other | Admitting: Cardiology

## 2016-08-09 VITALS — BP 110/64 | HR 88 | Resp 10 | Ht 68.0 in | Wt 157.8 lb

## 2016-08-09 DIAGNOSIS — C7951 Secondary malignant neoplasm of bone: Secondary | ICD-10-CM

## 2016-08-09 DIAGNOSIS — I10 Essential (primary) hypertension: Secondary | ICD-10-CM | POA: Diagnosis not present

## 2016-08-09 DIAGNOSIS — E785 Hyperlipidemia, unspecified: Secondary | ICD-10-CM

## 2016-08-09 NOTE — Patient Instructions (Signed)
Medication Instructions:  Your physician recommends that you continue on your current medications as directed. Please refer to the Current Medication list given to you today.   Labwork: None  Testing/Procedures: Your physician has recommended that you wear an event monitor. Event monitors are medical devices that record the heart's electrical activity. Doctors most often [place] these monitors to diagnose arrhythmias. Arrhythmias are problems with the speed or rhythm of the heartbeat. The monitor is a small, portable device. You can wear one while you do your normal daily activities. This is usually used to diagnose what is causing palpitations/syncope (passing out).  You had an EKG today.  Follow-Up: Your physician wants you to follow-up in: 3 months. You will receive a reminder letter in the mail two months in advance. If you don't receive a letter, please call our office to schedule the follow-up appointment.   Any Other Special Instructions Will Be Listed Below (If Applicable).     If you need a refill on your cardiac medications before your next appointment, please call your pharmacy.

## 2016-08-09 NOTE — Progress Notes (Signed)
2  Cardiology Office Note:    Date:  08/09/2016   ID:  [name], DOB 26-Oct-1945, MRN [id number]  PCP:  Nicoletta Dress, MD  Cardiologist:  Jenne Campus, MD    Referring MD: Nicoletta Dress, MD   Chief Complaint  Patient presents with  . Discuss Cath  Doing well but does have some episodes of dizziness.    History of Present Illness:    [name] is a 71 y.o. male  With coronary artery disease, years ago he did have a stress test done which shows some mild ophthalmology. Since that time he's been management medically and seems to be doing quite well. Denies having any chest pain tightness squeezing pressure burning chest. He walks every morning his daughter have no difficulty doing that. He still works 4 times a week. He works in Personal assistant. New complaint is some dizziness he said tablet once a week he'll be walking and get momentary dizziness. Sometimes he have to lay against the wall to prevent him from falling. There is no chest pain associated with the sensation of palpitations.  Past Medical History:  Diagnosis Date  . Essential hypertension 07/12/2014  . High blood sugar   . Prostate cancer ([place]) 07/23/2011  . Radiation 04/22/14-05/06/14   right sacrum 30 gray    Past Surgical History:  Procedure Laterality Date  . CIRCUMCISION     when patient was 71 years old  . HAND SURGERY Left   . ROBOT ASSISTED LAPAROSCOPIC RADICAL PROSTATECTOMY    . TONSILECTOMY/ADENOIDECTOMY WITH MYRINGOTOMY      Current Medications: Current Meds  Medication Sig  . abiraterone Acetate (ZYTIGA) 250 MG tablet Take 250 mg by mouth daily. After breakfast  . acetaminophen (TYLENOL) 500 MG tablet Take 1,000 mg by mouth every 4 (four) hours as needed for moderate pain.  Marland Kitchen aspirin 81 MG tablet Take 81 mg by mouth daily.  . Calcium Carbonate-Vitamin D (CALCIUM + D PO) Take 770 mg by mouth 2 (two) times daily.   . enzalutamide (XTANDI) 40 MG capsule Take 4 capsules (160 mg total) by mouth  daily.  Marland Kitchen gabapentin (NEURONTIN) 300 MG capsule TAKE 1 CAPSULE BY MOUTH TWICE A DAY THEN TAKE 2 CAPSULES AT BEDTIME  . HYDROmorphone (DILAUDID) 4 MG tablet Take 1 tablet (4 mg total) by mouth every 6 (six) hours as needed for severe pain.  Marland Kitchen ipratropium (ATROVENT) 0.06 % nasal spray Place 1 spray into both nostrils 4 (four) times daily.  . isosorbide mononitrate (IMDUR) 30 MG 24 hr tablet Take 30 mg by mouth daily.  . metFORMIN (GLUCOPHAGE-XR) 500 MG 24 hr tablet TAKE TWO TABLETS BY MOUTH DAILY WITH BREAKFAST  . ondansetron (ZOFRAN ODT) 4 MG disintegrating tablet Take 1 tablet (4 mg total) by mouth every 8 (eight) hours as needed for nausea or vomiting.  Marland Kitchen OVER THE COUNTER MEDICATION every morning. Juice Plus -- 8 capsule of each the garden, vineyard, and orchead  . predniSONE (DELTASONE) 5 MG tablet TAKE 1 TABLET BY MOUTH DAILY  . prochlorperazine (COMPAZINE) 10 MG tablet Take 1 tablet (10 mg total) by mouth every 6 (six) hours as needed for nausea or vomiting.  . traMADol (ULTRAM) 50 MG tablet Take 1 tablet (50 mg total) by mouth every 6 (six) hours as needed.  Marland Kitchen UNABLE TO FIND Med Name: Juice Plus Omega Blend  . UNABLE TO FIND Med Name: Juice Plus [address] [place], [employer]  . VESICARE 5 MG tablet TAKE 1  TABLET BY MOUTH DAILY. (Patient taking differently: TAKE 1 TABLET BY MOUTH TWICE A WEEK)  . ZETIA 10 MG tablet 10 mg daily.     Allergies:   Codeine and Hydrocodone   Social History   Social History  . Marital status: Married    Spouse name: N/A  . Number of children: 2  . Years of education: N/A   Occupational History  . real estate aprais. [employer]   Social History Main Topics  . Smoking status: Never Smoker  . Smokeless tobacco: Never Used     Comment: never used tobacco  . Alcohol use No  . Drug use: No  . Sexual activity: Not Asked   Other Topics Concern  . None   Social History Narrative  . None     Family History: The patient's  family history includes Heart disease in his father and mother; Prostate cancer in his father. ROS:   Please see the history of present illness.    All 14 point review of systems negative except as described per history of present illness  EKGs/Labs/Other Studies Reviewed:      Recent Labs: 08/03/2016: ALT(SGPT) 18; BUN, Bld 16; Creat 1.2; HGB 12.4; Platelets 280; Potassium 3.9; Sodium 140  Recent Lipid Panel    Component Value Date/Time   CHOL 219 (H) 10/29/2014 0844   TRIG 120 10/29/2014 0844   HDL 51 10/29/2014 0844   CHOLHDL 4.3 10/29/2014 0844   VLDL 24 10/29/2014 0844   LDLCALC 144 (H) 10/29/2014 0844    Physical Exam:    VS:  BP 110/64   Pulse 88   Resp 10   Ht 5\' 8"  (1.727 m)   Wt 157 lb 12.8 oz (71.6 kg)   BMI 23.99 kg/m     Wt Readings from Last 3 Encounters:  08/09/16 157 lb 12.8 oz (71.6 kg)  08/03/16 158 lb (71.7 kg)  07/23/16 163 lb (73.9 kg)     GEN:  Well nourished, well developed in no acute distress HEENT: Normal NECK: No JVD; No carotid bruits LYMPHATICS: No lymphadenopathy CARDIAC: RRR, no murmurs, no rubs, no gallops RESPIRATORY:  Clear to auscultation without rales, wheezing or rhonchi  ABDOMEN: Soft, non-tender, non-distended MUSCULOSKELETAL:  No edema; No deformity  SKIN: Warm and dry LOWER EXTREMITIES: no swelling NEUROLOGIC:  Alert and oriented x 3 PSYCHIATRIC:  Normal affect   ASSESSMENT:    1. Essential hypertension   2. 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 Dyslipidemia    PLAN:    In order of problems listed above:  Coronary artery disease: Mildly abnormal stress test but patient today for medical therapy and I agree with his assessment. He is on appropriate medications which I will continue. I told him he started having chest pain he needs to contact me immediately and that we'll proceed to cardiac catheterization. Dizziness: I will ask him to have event recorder. We will do EKG today. Dyslipidemia: He does not want to take  any additional cholesterol medication. Apparently he had a friend went up dying because of complication of cholesterol medication.  Prostate CA: Follow-up by gynecology team.  Medication Adjustments/Labs and Tests Ordered: Current medicines are reviewed at length with the patient today.  Concerns regarding medicines are outlined above.  No orders of the defined types were placed in this encounter.  Medication changes: No orders of the defined types were placed in this encounter.   Signed, Park Liter, MD, [facility] 08/09/2016 8:39 AM  [facility]

## 2016-08-17 ENCOUNTER — Other Ambulatory Visit (HOSPITAL_BASED_OUTPATIENT_CLINIC_OR_DEPARTMENT_OTHER): Payer: Medicare Other

## 2016-08-17 ENCOUNTER — Other Ambulatory Visit: Payer: Self-pay

## 2016-08-17 ENCOUNTER — Ambulatory Visit (HOSPITAL_BASED_OUTPATIENT_CLINIC_OR_DEPARTMENT_OTHER): Payer: Medicare Other

## 2016-08-17 ENCOUNTER — Ambulatory Visit (HOSPITAL_BASED_OUTPATIENT_CLINIC_OR_DEPARTMENT_OTHER): Payer: Medicare Other | Admitting: Hematology & Oncology

## 2016-08-17 VITALS — BP 154/87 | HR 92 | Temp 97.8°F | Resp 18 | Wt 157.0 lb

## 2016-08-17 DIAGNOSIS — C61 Malignant neoplasm of prostate: Secondary | ICD-10-CM | POA: Diagnosis not present

## 2016-08-17 DIAGNOSIS — C7951 Secondary malignant neoplasm of bone: Secondary | ICD-10-CM

## 2016-08-17 DIAGNOSIS — G893 Neoplasm related pain (acute) (chronic): Secondary | ICD-10-CM

## 2016-08-17 DIAGNOSIS — M79651 Pain in right thigh: Secondary | ICD-10-CM

## 2016-08-17 DIAGNOSIS — Z5111 Encounter for antineoplastic chemotherapy: Secondary | ICD-10-CM

## 2016-08-17 LAB — CMP (CANCER CENTER ONLY)
ALT(SGPT): 18 U/L (ref 10–47)
AST: 23 U/L (ref 11–38)
Albumin: 3.6 g/dL (ref 3.3–5.5)
Alkaline Phosphatase: 79 U/L (ref 26–84)
BUN, Bld: 16 mg/dL (ref 7–22)
CO2: 33 mEq/L (ref 18–33)
Calcium: 9.7 mg/dL (ref 8.0–10.3)
Chloride: 102 mEq/L (ref 98–108)
Creat: 1.3 mg/dl — ABNORMAL HIGH (ref 0.6–1.2)
Glucose, Bld: 79 mg/dL (ref 73–118)
Potassium: 3.6 mEq/L (ref 3.3–4.7)
Sodium: 137 mEq/L (ref 128–145)
Total Bilirubin: 0.6 mg/dl (ref 0.20–1.60)
Total Protein: 7.6 g/dL (ref 6.4–8.1)

## 2016-08-17 LAB — CBC WITH DIFFERENTIAL (CANCER CENTER ONLY)
BASO#: 0 10*3/uL (ref 0.0–0.2)
BASO%: 0.3 % (ref 0.0–2.0)
EOS%: 4.6 % (ref 0.0–7.0)
Eosinophils Absolute: 0.5 10*3/uL (ref 0.0–0.5)
HCT: 39.7 % (ref 38.7–49.9)
HGB: 13.1 g/dL (ref 13.0–17.1)
LYMPH#: 1.4 10*3/uL (ref 0.9–3.3)
LYMPH%: 12 % — ABNORMAL LOW (ref 14.0–48.0)
MCH: 31.7 pg (ref 28.0–33.4)
MCHC: 33 g/dL (ref 32.0–35.9)
MCV: 96 fL (ref 82–98)
MONO#: 1.1 10*3/uL — ABNORMAL HIGH (ref 0.1–0.9)
MONO%: 9.5 % (ref 0.0–13.0)
NEUT#: 8.3 10*3/uL — ABNORMAL HIGH (ref 1.5–6.5)
NEUT%: 73.6 % (ref 40.0–80.0)
Platelets: 278 10*3/uL (ref 145–400)
RBC: 4.13 10*6/uL — ABNORMAL LOW (ref 4.20–5.70)
RDW: 12.4 % (ref 11.1–15.7)
WBC: 11.2 10*3/uL — ABNORMAL HIGH (ref 4.0–10.0)

## 2016-08-17 MED ORDER — ONDANSETRON 4 MG PO TBDP: 4.0000 mg | ORAL_TABLET | Freq: Three times a day (TID) | ORAL | 2 refills | Status: DC | PRN

## 2016-08-17 MED ORDER — HYDROMORPHONE HCL 4 MG PO TABS: 4.0000 mg | ORAL_TABLET | Freq: Four times a day (QID) | ORAL | 0 refills | Status: DC | PRN

## 2016-08-17 MED ORDER — METHYLPREDNISOLONE 4 MG PO TBPK: ORAL_TABLET | ORAL | 0 refills | Status: DC

## 2016-08-17 MED ORDER — DENOSUMAB 120 MG/1.7ML ~~LOC~~ SOLN
120.0000 mg | Freq: Once | SUBCUTANEOUS | Status: AC
  Administered 2016-08-17: 120 mg via SUBCUTANEOUS

## 2016-08-17 MED ORDER — LEUPROLIDE ACETATE (3 MONTH) 22.5 MG IM KIT
22.5000 mg | PACK | Freq: Once | INTRAMUSCULAR | Status: AC
  Administered 2016-08-17: 22.5 mg via INTRAMUSCULAR
  Filled 2016-08-17: qty 22.5

## 2016-08-17 MED ORDER — TRAMADOL HCL 50 MG PO TABS: 50.0000 mg | ORAL_TABLET | Freq: Four times a day (QID) | ORAL | 2 refills | Status: DC | PRN

## 2016-08-17 MED FILL — traMADol HCL 50 MG TABS: 50 | 22 days supply | Qty: 90 | Fill #0

## 2016-08-17 MED FILL — HYDROmorphone HCL 4 MG TABS: 4 | 15 days supply | Qty: 60 | Fill #0

## 2016-08-17 MED FILL — ONDANSETRON ODT 4 MG TABLET: 4 | 10 days supply | Qty: 30 | Fill #0

## 2016-08-17 MED FILL — METHYLPREDNISOLONE 4 MG TAB: 4 | 21 days supply | Qty: 21 | Fill #0

## 2016-08-17 MED FILL — EZETIMIBE 10 MG TABLET: 10 | 90 days supply | Qty: 90 | Fill #2

## 2016-08-17 NOTE — Patient Instructions (Addendum)
Leuprolide depot injection What is this medicine? LEUPROLIDE (loo PROE lide) is a man-made protein that acts like a natural hormone in the body. It decreases testosterone in men and decreases estrogen in women. In men, this medicine is used to treat advanced prostate cancer. In women, some forms of this medicine may be used to treat endometriosis, uterine fibroids, or other male hormone-related problems. This medicine may be used for other purposes; ask your health care provider or pharmacist if you have questions. COMMON BRAND NAME(S): Eligard, Lupron Depot, Lupron Depot-Ped, Viadur What should I tell my health care provider before I take this medicine? They need to know if you have any of these conditions: -diabetes -heart disease or previous heart attack -high blood pressure -high cholesterol -mental illness -osteoporosis -pain or difficulty passing urine -seizures -spinal cord metastasis -stroke -suicidal thoughts, plans, or attempt; a previous suicide attempt by you or a family member -tobacco smoker -unusual vaginal bleeding (women) -an unusual or allergic reaction to leuprolide, benzyl alcohol, other medicines, foods, dyes, or preservatives -pregnant or trying to get pregnant -breast-feeding How should I use this medicine? This medicine is for injection into a muscle or for injection under the skin. It is given by a health care professional in a hospital or clinic setting. The specific product will determine how it will be given to you. Make sure you understand which product you receive and how often you will receive it. Talk to your pediatrician regarding the use of this medicine in children. Special care may be needed. Overdosage: If you think you have taken too much of this medicine contact a poison control center or emergency room at once. NOTE: This medicine is only for you. Do not share this medicine with others. What if I miss a dose? It is important not to miss a dose.  Call your doctor or health care professional if you are unable to keep an appointment. Depot injections: Depot injections are given either once-monthly, every 12 weeks, every 16 weeks, or every 24 weeks depending on the product you are prescribed. The product you are prescribed will be based on if you are male or male, and your condition. Make sure you understand your product and dosing. What may interact with this medicine? Do not take this medicine with any of the following medications: -chasteberry This medicine may also interact with the following medications: -herbal or dietary supplements, like black cohosh or DHEA -male hormones, like estrogens or progestins and birth control pills, patches, rings, or injections -male hormones, like testosterone This list may not describe all possible interactions. Give your health care provider a list of all the medicines, herbs, non-prescription drugs, or dietary supplements you use. Also tell them if you smoke, drink alcohol, or use illegal drugs. Some items may interact with your medicine. What should I watch for while using this medicine? Visit your doctor or health care professional for regular checks on your progress. During the first weeks of treatment, your symptoms may get worse, but then will improve as you continue your treatment. You may get hot flashes, increased bone pain, increased difficulty passing urine, or an aggravation of nerve symptoms. Discuss these effects with your doctor or health care professional, some of them may improve with continued use of this medicine. Male patients may experience a menstrual cycle or spotting during the first months of therapy with this medicine. If this continues, contact your doctor or health care professional. What side effects may I notice from receiving this medicine? Side  effects that you should report to your doctor or health care professional as soon as possible: -allergic reactions like skin  rash, itching or hives, swelling of the face, lips, or tongue -breathing problems -chest pain -depression or memory disorders -pain in your legs or groin -pain at site where injected or implanted -seizures -severe headache -swelling of the feet and legs -suicidal thoughts or other mood changes -visual changes -vomiting Side effects that usually do not require medical attention (report to your doctor or health care professional if they continue or are bothersome): -breast swelling or tenderness -decrease in sex drive or performance -diarrhea -hot flashes -loss of appetite -muscle, joint, or bone painsDenosumab injection What is this medicine? DENOSUMAB (den oh sue mab) slows bone breakdown. Prolia is used to treat osteoporosis in women after menopause and in men. Delton See is used to treat a high calcium level due to cancer and to prevent bone fractures and other bone problems caused by multiple myeloma or cancer bone metastases. Delton See is also used to treat giant cell tumor of the bone. This medicine may be used for other purposes; ask your health care provider or pharmacist if you have questions. COMMON BRAND NAME(S): Prolia, XGEVA What should I tell my health care provider before I take this medicine? They need to know if you have any of these conditions: -dental disease -having surgery or tooth extraction -infection -kidney disease -low levels of calcium or Vitamin D in the blood -malnutrition -on hemodialysis -skin conditions or sensitivity -thyroid or parathyroid disease -an unusual reaction to denosumab, other medicines, foods, dyes, or preservatives -pregnant or trying to get pregnant -breast-feeding How should I use this medicine? This medicine is for injection under the skin. It is given by a health care professional in a hospital or clinic setting. If you are getting Prolia, a special MedGuide will be given to you by the pharmacist with each prescription and refill. Be  sure to read this information carefully each time. For Prolia, talk to your pediatrician regarding the use of this medicine in children. Special care may be needed. For Delton See, talk to your pediatrician regarding the use of this medicine in children. While this drug may be prescribed for children as young as 13 years for selected conditions, precautions do apply. Overdosage: If you think you have taken too much of this medicine contact a poison control center or emergency room at once. NOTE: This medicine is only for you. Do not share this medicine with others. What if I miss a dose? It is important not to miss your dose. Call your doctor or health care professional if you are unable to keep an appointment. What may interact with this medicine? Do not take this medicine with any of the following medications: -other medicines containing denosumab This medicine may also interact with the following medications: -medicines that lower your chance of fighting infection -steroid medicines like prednisone or cortisone This list may not describe all possible interactions. Give your health care provider a list of all the medicines, herbs, non-prescription drugs, or dietary supplements you use. Also tell them if you smoke, drink alcohol, or use illegal drugs. Some items may interact with your medicine. What should I watch for while using this medicine? Visit your doctor or health care professional for regular checks on your progress. Your doctor or health care professional may order blood tests and other tests to see how you are doing. Call your doctor or health care professional for advice if you get a fever,  chills or sore throat, or other symptoms of a cold or flu. Do not treat yourself. This drug may decrease your body's ability to fight infection. Try to avoid being around people who are sick. You should make sure you get enough calcium and vitamin D while you are taking this medicine, unless your doctor  tells you not to. Discuss the foods you eat and the vitamins you take with your health care professional. See your dentist regularly. Brush and floss your teeth as directed. Before you have any dental work done, tell your dentist you are receiving this medicine. Do not become pregnant while taking this medicine or for 5 months after stopping it. Talk with your doctor or health care professional about your birth control options while taking this medicine. Women should inform their doctor if they wish to become pregnant or think they might be pregnant. There is a potential for serious side effects to an unborn child. Talk to your health care professional or pharmacist for more information. What side effects may I notice from receiving this medicine? Side effects that you should report to your doctor or health care professional as soon as possible: -allergic reactions like skin rash, itching or hives, swelling of the face, lips, or tongue -bone pain -breathing problems -dizziness -jaw pain, especially after dental work -redness, blistering, peeling of the skin -signs and symptoms of infection like fever or chills; cough; sore throat; pain or trouble passing urine -signs of low calcium like fast heartbeat, muscle cramps or muscle pain; pain, tingling, numbness in the hands or feet; seizures -unusual bleeding or bruising -unusually weak or tired Side effects that usually do not require medical attention (report to your doctor or health care professional if they continue or are bothersome): -constipation -diarrhea -headache -joint pain -loss of appetite -muscle pain -runny nose -tiredness -upset stomach This list may not describe all possible side effects. Call your doctor for medical advice about side effects. You may report side effects to FDA at 1-800-FDA-1088. Where should I keep my medicine? This medicine is only given in a clinic, doctor's office, or other health care setting and will not  be stored at home. NOTE: This sheet is a summary. It may not cover all possible information. If you have questions about this medicine, talk to your doctor, pharmacist, or health care provider.  2018 Elsevier/Gold Standard (2016-01-24 19:17:21)  -nausea -redness or irritation at site where injected or implanted -skin problems or acne This list may not describe all possible side effects. Call your doctor for medical advice about side effects. You may report side effects to FDA at 1-800-FDA-1088. Where should I keep my medicine? This drug is given in a hospital or clinic and will not be stored at home. NOTE: This sheet is a summary. It may not cover all possible information. If you have questions about this medicine, talk to your doctor, pharmacist, or health care provider.  2018 Elsevier/Gold Standard (2015-06-16 09:45:53)

## 2016-08-17 NOTE — Progress Notes (Signed)
Hematology and Oncology Follow Up Visit  [name] [id number] [birth year] 71 y.o. 08/17/2016   Principle Diagnosis:   Metastatic castrate resistant prostate cancer  Current Therapy:    Zytiga 1000 mg by mouth daily - discontinued on 08/15/2016  Xtandi 160 mg po q day - start on 08/15/2016  Xgeva 120 mg subcutaneous Q3 months - due in  August  Lupron 22 mg IM every 3 months - due in August  Radium-223 therapy -- s/p cycle #6  Palliative radiation to the right sacrum     Interim History:  [name] is back for followup. He is not doing too well. He comes in with a cane. He is a little unsteady on his feet because of some pain and weakness in the right leg.  His wife and sister come in. They make it very well known how much she's had a hard time over the past month. Despite our interventions, he still has lot of discomfort.  He had a MRI of the pelvis back on July 14. This did not show anything that was obvious down in the pelvis. He had a little bit of swelling of his piriformis muscle. He had a stable sacral lesion.  He is on tramadol. He is on Dilaudid. This does not seem to help him all that much. He also is on gabapentin.  Unfortunately, his PSA really has not helped this. His PSA has always been quite low. His testosterone is depleted.  Thankfully, he did not need a cardiac cath. He saw his cardiologist. He apparently is going to have a cardiac monitor placed.  He is trying to work. He is having more difficulty at work because of the pain.  He's had no fever. He started the [place]. He's having more hot flashes. He's had some vomiting.   Overall, his performance status is ECOG 1.    Medications:  Current Outpatient Prescriptions:  .  acetaminophen (TYLENOL) 500 MG tablet, Take 1,000 mg by mouth every 4 (four) hours as needed for moderate pain., Disp: , Rfl:  .  aspirin 81 MG tablet, Take 81 mg by mouth daily., Disp: , Rfl:  .  Calcium Carbonate-Vitamin D (CALCIUM  + D PO), Take 770 mg by mouth 2 (two) times daily. , Disp: , Rfl:  .  enzalutamide (XTANDI) 40 MG capsule, Take 4 capsules (160 mg total) by mouth daily., Disp: 120 capsule, Rfl: 4 .  gabapentin (NEURONTIN) 300 MG capsule, TAKE 1 CAPSULE BY MOUTH TWICE A DAY THEN TAKE 2 CAPSULES AT BEDTIME, Disp: 120 capsule, Rfl: 4 .  HYDROmorphone (DILAUDID) 4 MG tablet, Take 1 tablet (4 mg total) by mouth every 6 (six) hours as needed for severe pain., Disp: 20 tablet, Rfl: 0 .  ipratropium (ATROVENT) 0.06 % nasal spray, Place 1 spray into both nostrils 4 (four) times daily., Disp: , Rfl:  .  isosorbide mononitrate (IMDUR) 30 MG 24 hr tablet, Take 30 mg by mouth daily., Disp: , Rfl:  .  metFORMIN (GLUCOPHAGE-XR) 500 MG 24 hr tablet, TAKE TWO TABLETS BY MOUTH DAILY WITH BREAKFAST, Disp: 180 tablet, Rfl: 2 .  ondansetron (ZOFRAN ODT) 4 MG disintegrating tablet, Take 1 tablet (4 mg total) by mouth every 8 (eight) hours as needed for nausea or vomiting., Disp: 12 tablet, Rfl: 2 .  OVER THE COUNTER MEDICATION, every morning. Juice Plus -- 8 capsule of each the garden, vineyard, and orchead, Disp: , Rfl:  .  predniSONE (DELTASONE) 5 MG tablet, TAKE 1 TABLET BY  MOUTH DAILY, Disp: 60 tablet, Rfl: 3 .  prochlorperazine (COMPAZINE) 10 MG tablet, Take 1 tablet (10 mg total) by mouth every 6 (six) hours as needed for nausea or vomiting., Disp: 30 tablet, Rfl: 0 .  traMADol (ULTRAM) 50 MG tablet, Take 1 tablet (50 mg total) by mouth every 6 (six) hours as needed., Disp: 90 tablet, Rfl: 2 .  UNABLE TO FIND, Med Name: Juice Plus Omega Blend, Disp: , Rfl:  .  UNABLE TO FIND, Med Name: Juice Plus [employer], [place], [employer], Disp: , Rfl:  .  VESICARE 5 MG tablet, TAKE 1 TABLET BY MOUTH DAILY. (Patient taking differently: TAKE 1 TABLET BY MOUTH TWICE A WEEK), Disp: 30 tablet, Rfl: 4 .  ZETIA 10 MG tablet, 10 mg daily., Disp: , Rfl:   Allergies:  Allergies  Allergen Reactions  . Codeine Nausea And Vomiting  .  Hydrocodone Nausea And Vomiting    Past Medical History, Surgical history, Social history, and Family History were reviewed and updated.  Review of Systems: As above  Physical Exam:  weight is 157 lb (71.2 kg). His oral temperature is 97.8 F (36.6 C). His blood pressure is 154/87 (abnormal) and his pulse is 92. His respiration is 18 and oxygen saturation is 100%.   Well-developed and well-nourished white male. Head and neck exam shows no ocular or oral lesions. The are no palpable cervical or supraclavicular lymph nodes. Lungs are clear with no rales, wheezes or rhonchi. Cardiac exam regular rate and rhythm with no murmurs rubs or bruits. Abdomen is soft. He has good bowel sounds. There is no fluid wave. There is no palpable liver or spleen tip. Back exam shows no tenderness over the spine ribs or hips. I cannot elicit any tenderness over the sacrum bilaterally. He has good range of motion of his pelvis. Extremities shows no clubbing, cyanosis or edema. Skin exam shows areas of urticaria on the left forearm.. There is also an area as solitary in the upper chest wall in the upper sternal region. These are nontender. There are erythematous with a central area of vesicle. No erythema to the skin is noted. Neurological exam is nonfocal.   Lab Results  Component Value Date   WBC 11.2 (H) 08/17/2016   HGB 13.1 08/17/2016   HCT 39.7 08/17/2016   MCV 96 08/17/2016   PLT 278 08/17/2016     Chemistry      Component Value Date/Time   NA 137 08/17/2016 1050   NA 141 10/14/2015 0803   K 3.6 08/17/2016 1050   K 4.2 10/14/2015 0803   CL 102 08/17/2016 1050   CO2 33 08/17/2016 1050   CO2 27 10/14/2015 0803   BUN 16 08/17/2016 1050   BUN 17.9 10/14/2015 0803   CREATININE 1.3 (H) 08/17/2016 1050   CREATININE 1.2 10/14/2015 0803      Component Value Date/Time   CALCIUM 9.7 08/17/2016 1050   CALCIUM 10.0 10/14/2015 0803   ALKPHOS 79 08/17/2016 1050   ALKPHOS 78 10/14/2015 0803   AST 23  08/17/2016 1050   AST 17 10/14/2015 0803   ALT 18 08/17/2016 1050   ALT 12 10/14/2015 0803   BILITOT 0.60 08/17/2016 1050   BILITOT 0.45 10/14/2015 0803         Impression and Plan: [name] is 71 year old gentleman with metastatic prostate cancer.   He did receive his Lupron today. He also received his Xgeva.  I am not sure why he is having this pain.  The Axumin PET scan did not show any obvious issues down the lower back and pelvis.  We will have to get an MRI of his lumbar spine. I will try to get this set up for the weekend.   If we find that this shows obvious malignancy causing his pain, we may have to consider some radiation to the area. I note he has had radiation to his pelvis. Possibly, he might be a candidate for "targeted" radiation.  I spent about 45 minutes with he and his family. I just feel bad for him and that he hurts so much.   I will see him back in 2 weeks.   [name], MD 8/3/201811:50 AM

## 2016-08-18 ENCOUNTER — Ambulatory Visit (HOSPITAL_BASED_OUTPATIENT_CLINIC_OR_DEPARTMENT_OTHER)
Admission: RE | Admit: 2016-08-18 | Discharge: 2016-08-18 | Disposition: A | Discharge status: Home / Self Care | Payer: Medicare Other | Source: Ambulatory Visit | Attending: Hematology & Oncology | Admitting: Hematology & Oncology

## 2016-08-18 DIAGNOSIS — M545 Low back pain: Secondary | ICD-10-CM | POA: Diagnosis not present

## 2016-08-18 DIAGNOSIS — C7951 Secondary malignant neoplasm of bone: Secondary | ICD-10-CM | POA: Diagnosis not present

## 2016-08-18 DIAGNOSIS — M5136 Other intervertebral disc degeneration, lumbar region: Secondary | ICD-10-CM | POA: Insufficient documentation

## 2016-08-18 DIAGNOSIS — C801 Malignant (primary) neoplasm, unspecified: Secondary | ICD-10-CM | POA: Diagnosis not present

## 2016-08-18 MED ORDER — GADOBENATE DIMEGLUMINE 529 MG/ML IV SOLN
15.0000 mL | Freq: Once | INTRAVENOUS | Status: AC | PRN
  Administered 2016-08-18: 14 mL via INTRAVENOUS

## 2016-08-21 ENCOUNTER — Other Ambulatory Visit: Payer: Self-pay | Admitting: Cardiology

## 2016-08-21 DIAGNOSIS — C7951 Secondary malignant neoplasm of bone: Secondary | ICD-10-CM

## 2016-08-21 DIAGNOSIS — R42 Dizziness and giddiness: Secondary | ICD-10-CM

## 2016-08-21 DIAGNOSIS — E785 Hyperlipidemia, unspecified: Secondary | ICD-10-CM

## 2016-08-21 DIAGNOSIS — I10 Essential (primary) hypertension: Secondary | ICD-10-CM

## 2016-08-21 DIAGNOSIS — R002 Palpitations: Secondary | ICD-10-CM

## 2016-08-22 ENCOUNTER — Ambulatory Visit: Payer: Medicare Other

## 2016-08-22 DIAGNOSIS — R002 Palpitations: Secondary | ICD-10-CM | POA: Diagnosis not present

## 2016-08-22 DIAGNOSIS — R42 Dizziness and giddiness: Secondary | ICD-10-CM | POA: Diagnosis not present

## 2016-08-24 ENCOUNTER — Telehealth: Payer: Self-pay | Admitting: Pharmacist

## 2016-08-24 ENCOUNTER — Encounter: Payer: Self-pay | Admitting: Hematology & Oncology

## 2016-08-24 NOTE — Telephone Encounter (Signed)
Oral Chemotherapy Pharmacist Encounter  Follow-Up Form  Called patient today to follow up regarding patient's oral chemotherapy medication: Enzalutamide Gillermina Phy)   Original Start date of oral chemotherapy: 08/15/16  Pt is doing well today  Pt reports 2-3 doses of Xtandi missed since starting the mediaction.  Missed dose(s) attributed to: the patient stated he vomited the medication up on the 2-3 days he "missed" the tablets  Pt reports the following side effects:  - WrJoya Gaskins reported night sweats and that he sweats occasional when he is getting ready in the morning. He believes this is new since starting the medication. Mr. Carneiro also stated he is not in pain when the sweats occur so he does not think they are related to his pain.   Recent labs reviewed: CMP and CBC (08/17/16)  Other Issues:  - Pt reported vomiting yesterday once. He did not think this was from the medication but more from his leg pain. - No also reported episodes of dizziness/lightheadedness where he needs to brace himself using the wall. This was going on before the start of Xtandi. He reported no falls d/t dizziness.  - He stated he has been constipated for the past 2 months for which he is using prune juice and milk of mag  Patient knows to call the office with questions or concerns. Oral Oncology Clinic will continue to follow.  Thank you,  Nuala Alpha, PharmD, BCPS 08/24/2016 4:38 PM Oral Oncology Clinic

## 2016-08-27 DIAGNOSIS — Z6823 Body mass index (BMI) 23.0-23.9, adult: Secondary | ICD-10-CM | POA: Diagnosis not present

## 2016-08-27 DIAGNOSIS — M5416 Radiculopathy, lumbar region: Secondary | ICD-10-CM | POA: Diagnosis not present

## 2016-08-31 ENCOUNTER — Ambulatory Visit (HOSPITAL_BASED_OUTPATIENT_CLINIC_OR_DEPARTMENT_OTHER): Payer: Medicare Other

## 2016-08-31 ENCOUNTER — Other Ambulatory Visit (HOSPITAL_BASED_OUTPATIENT_CLINIC_OR_DEPARTMENT_OTHER): Payer: Medicare Other

## 2016-08-31 ENCOUNTER — Other Ambulatory Visit: Payer: Self-pay | Admitting: *Deleted

## 2016-08-31 ENCOUNTER — Ambulatory Visit (HOSPITAL_BASED_OUTPATIENT_CLINIC_OR_DEPARTMENT_OTHER): Payer: Medicare Other | Admitting: Hematology & Oncology

## 2016-08-31 VITALS — BP 157/96 | HR 92 | Temp 97.5°F | Resp 20 | Wt 152.0 lb

## 2016-08-31 DIAGNOSIS — C7951 Secondary malignant neoplasm of bone: Secondary | ICD-10-CM

## 2016-08-31 DIAGNOSIS — G893 Neoplasm related pain (acute) (chronic): Secondary | ICD-10-CM

## 2016-08-31 DIAGNOSIS — K5900 Constipation, unspecified: Secondary | ICD-10-CM | POA: Diagnosis not present

## 2016-08-31 DIAGNOSIS — C61 Malignant neoplasm of prostate: Secondary | ICD-10-CM | POA: Diagnosis present

## 2016-08-31 DIAGNOSIS — E860 Dehydration: Secondary | ICD-10-CM

## 2016-08-31 DIAGNOSIS — M79651 Pain in right thigh: Secondary | ICD-10-CM | POA: Diagnosis not present

## 2016-08-31 DIAGNOSIS — K140 Glossitis: Secondary | ICD-10-CM

## 2016-08-31 DIAGNOSIS — M5416 Radiculopathy, lumbar region: Secondary | ICD-10-CM | POA: Diagnosis not present

## 2016-08-31 LAB — CBC WITH DIFFERENTIAL (CANCER CENTER ONLY)
BASO#: 0 10*3/uL (ref 0.0–0.2)
BASO%: 0.3 % (ref 0.0–2.0)
EOS%: 1.8 % (ref 0.0–7.0)
Eosinophils Absolute: 0.2 10*3/uL (ref 0.0–0.5)
HCT: 42.7 % (ref 38.7–49.9)
HGB: 14.3 g/dL (ref 13.0–17.1)
LYMPH#: 1.2 10*3/uL (ref 0.9–3.3)
LYMPH%: 12.3 % — ABNORMAL LOW (ref 14.0–48.0)
MCH: 31.6 pg (ref 28.0–33.4)
MCHC: 33.5 g/dL (ref 32.0–35.9)
MCV: 95 fL (ref 82–98)
MONO#: 0.8 10*3/uL (ref 0.1–0.9)
MONO%: 8.1 % (ref 0.0–13.0)
NEUT#: 7.3 10*3/uL — ABNORMAL HIGH (ref 1.5–6.5)
NEUT%: 77.5 % (ref 40.0–80.0)
Platelets: 305 10*3/uL (ref 145–400)
RBC: 4.52 10*6/uL (ref 4.20–5.70)
RDW: 12.8 % (ref 11.1–15.7)
WBC: 9.4 10*3/uL (ref 4.0–10.0)

## 2016-08-31 LAB — CMP (CANCER CENTER ONLY)
ALT(SGPT): 15 U/L (ref 10–47)
AST: 26 U/L (ref 11–38)
Albumin: 3.8 g/dL (ref 3.3–5.5)
Alkaline Phosphatase: 84 U/L (ref 26–84)
BUN, Bld: 12 mg/dL (ref 7–22)
CO2: 31 mEq/L (ref 18–33)
Calcium: 9.8 mg/dL (ref 8.0–10.3)
Chloride: 100 mEq/L (ref 98–108)
Creat: 1.1 mg/dl (ref 0.6–1.2)
Glucose, Bld: 111 mg/dL (ref 73–118)
Potassium: 4 mEq/L (ref 3.3–4.7)
Sodium: 139 mEq/L (ref 128–145)
Total Bilirubin: 0.8 mg/dl (ref 0.20–1.60)
Total Protein: 8.1 g/dL (ref 6.4–8.1)

## 2016-08-31 MED ORDER — FAMOTIDINE IN NACL 20-0.9 MG/50ML-% IV SOLN
INTRAVENOUS | Status: AC
  Filled 2016-08-31: qty 100

## 2016-08-31 MED ORDER — SODIUM CHLORIDE 0.9 % IV SOLN
Freq: Once | INTRAVENOUS | Status: AC
  Administered 2016-08-31: 09:00:00 via INTRAVENOUS

## 2016-08-31 MED ORDER — KETOROLAC TROMETHAMINE 15 MG/ML IJ SOLN
30.0000 mg | Freq: Once | INTRAMUSCULAR | Status: AC
  Administered 2016-08-31: 30 mg via INTRAVENOUS

## 2016-08-31 MED ORDER — PROCHLORPERAZINE EDISYLATE 5 MG/ML IJ SOLN
10.0000 mg | Freq: Four times a day (QID) | INTRAMUSCULAR | Status: DC | PRN
  Administered 2016-08-31: 10 mg via INTRAVENOUS

## 2016-08-31 MED ORDER — FAMOTIDINE IN NACL 20-0.9 MG/50ML-% IV SOLN
40.0000 mg | Freq: Two times a day (BID) | INTRAVENOUS | Status: DC
  Administered 2016-08-31: 40 mg via INTRAVENOUS

## 2016-08-31 MED ORDER — MAGIC MOUTHWASH W/LIDOCAINE: 5.0000 mL | Freq: Four times a day (QID) | ORAL | 6 refills | Status: DC | PRN

## 2016-08-31 MED ORDER — HYDROMORPHONE HCL 1 MG/ML IJ SOLN
INTRAMUSCULAR | Status: AC
  Filled 2016-08-31: qty 2

## 2016-08-31 MED ORDER — MAGIC MOUTHWASH W/LIDOCAINE: 10.0000 mL | Freq: Four times a day (QID) | ORAL | 4 refills | Status: DC

## 2016-08-31 MED ORDER — KETOROLAC TROMETHAMINE 15 MG/ML IJ SOLN
INTRAMUSCULAR | Status: AC
  Filled 2016-08-31: qty 2

## 2016-08-31 MED ORDER — HYDROMORPHONE HCL 1 MG/ML IJ SOLN
2.0000 mg | INTRAMUSCULAR | Status: DC | PRN
  Administered 2016-08-31: 2 mg via INTRAVENOUS

## 2016-08-31 MED FILL — MAGIC MOUTHWASH W/LIDO 1:1: 25 days supply | Qty: 500 | Fill #0

## 2016-08-31 MED FILL — GABAPENTIN 300 MG CAPSULE: 300 | 15 days supply | Qty: 90 | Fill #0

## 2016-08-31 NOTE — Progress Notes (Signed)
Patient reports feeling better "No more nausea" and he had to leave after his medication administration so quickly so he could make it to another MD appointment - Dr Marin Olp was made aware patient was not able to receive the ordered IVF after medications due to his own personal time constraints.

## 2016-08-31 NOTE — Progress Notes (Signed)
Hematology and Oncology Follow Up Visit  [name] [id number] 02-Nov-1945 71 y.o. 08/31/2016   Principle Diagnosis:   Metastatic castrate resistant prostate cancer  Current Therapy:    Zytiga 1000 mg by mouth daily - discontinued on 08/15/2016  Xtandi 160 mg po q day - start on 08/15/2016 - on hold  Xgeva 120 mg subcutaneous Q3 months - due in  August  Lupron 22 mg IM every 3 months - due in August  Radium-223 therapy -- s/p cycle #6  Palliative radiation to the right sacrum     Interim History:  Mr.  [name] is back for followup. He is not doing too well. He comes in with a cane. He is a little unsteady on his feet because of some pain and weakness in the right leg.  His wife and sister come in. He still is having a very tough time. He is having some emesis. This started with the XTANDI. As such, we are going to hold the [facility].  He did have an MRI of his lumbar spine a week ago. It is hard to say from the report as to what the problem is. He clearly has weakness in his right leg, particularly the S1 distribution. The MRI report shows that he definitely has degenerative changes. Of course, the radiologist note a small lesion in S1 which "might be" affecting the S1 nerve root.  He sees the orthopedic spine surgeon today.  He is taking tramadol and Dilaudid. This gives him some relief.  He says that there is some weakness in his right leg. Sometimes, the leg can give way and he loses his balance and falls.  Because of the pain, it is hard for him to work right now.  He is not eating. He looks dehydrated. I think he clearly needs some IV fluids.  There is no bleeding. He's had no fever. He's had no headache. He is constipated. I'm sure this is from the pain medication.  His tongue also is "burning." He may have some glossitis.   Overall, his performance status is ECOG 2.    Medications:  Current Outpatient Prescriptions:  .  acetaminophen (TYLENOL) 500 MG tablet, Take  1,000 mg by mouth every 4 (four) hours as needed for moderate pain., Disp: , Rfl:  .  aspirin 81 MG tablet, Take 81 mg by mouth daily., Disp: , Rfl:  .  Calcium Carbonate-Vitamin D (CALCIUM + D PO), Take 770 mg by mouth 2 (two) times daily. , Disp: , Rfl:  .  enzalutamide (XTANDI) 40 MG capsule, Take 4 capsules (160 mg total) by mouth daily., Disp: 120 capsule, Rfl: 4 .  gabapentin (NEURONTIN) 300 MG capsule, TAKE 1 CAPSULE BY MOUTH TWICE A DAY THEN TAKE 2 CAPSULES AT BEDTIME, Disp: 120 capsule, Rfl: 4 .  HYDROmorphone (DILAUDID) 4 MG tablet, Take 1 tablet (4 mg total) by mouth every 6 (six) hours as needed for severe pain., Disp: 60 tablet, Rfl: 0 .  ipratropium (ATROVENT) 0.06 % nasal spray, Place 1 spray into both nostrils 4 (four) times daily., Disp: , Rfl:  .  isosorbide mononitrate (IMDUR) 30 MG 24 hr tablet, Take 30 mg by mouth daily., Disp: , Rfl:  .  metFORMIN (GLUCOPHAGE-XR) 500 MG 24 hr tablet, TAKE TWO TABLETS BY MOUTH DAILY WITH BREAKFAST, Disp: 180 tablet, Rfl: 2 .  ondansetron (ZOFRAN ODT) 4 MG disintegrating tablet, Take 1 tablet (4 mg total) by mouth every 8 (eight) hours as needed for nausea or vomiting., Disp: 30  tablet, Rfl: 2 .  OVER THE COUNTER MEDICATION, every morning. Juice Plus -- 8 capsule of each the garden, vineyard, and orchead, Disp: , Rfl:  .  predniSONE (DELTASONE) 5 MG tablet, TAKE 1 TABLET BY MOUTH DAILY, Disp: 60 tablet, Rfl: 3 .  prochlorperazine (COMPAZINE) 10 MG tablet, Take 1 tablet (10 mg total) by mouth every 6 (six) hours as needed for nausea or vomiting., Disp: 30 tablet, Rfl: 0 .  traMADol (ULTRAM) 50 MG tablet, Take 1 tablet (50 mg total) by mouth every 6 (six) hours as needed., Disp: 90 tablet, Rfl: 2 .  UNABLE TO FIND, Med Name: Juice Plus Omega Blend, Disp: , Rfl:  .  UNABLE TO FIND, Med Name: Juice Plus [employer], Garden Blend, [employer], Disp: , Rfl:  .  VESICARE 5 MG tablet, TAKE 1 TABLET BY MOUTH DAILY. (Patient taking differently: TAKE 1  TABLET BY MOUTH TWICE A WEEK), Disp: 30 tablet, Rfl: 4 .  ZETIA 10 MG tablet, 10 mg daily., Disp: , Rfl:   Current Facility-Administered Medications:  .  famotidine (PEPCID) IVPB 20 mg premix, 40 mg, Intravenous, Q12H, [name], MD, 40 mg at 08/31/16 0929 .  HYDROmorphone (DILAUDID) injection 2 mg, 2 mg, Intravenous, Q2H PRN, Ennever, Rudell Cobb, MD .  ketorolac (TORADOL) 15 MG/ML injection 30 mg, 30 mg, Intravenous, Once, Ennever, Rudell Cobb, MD .  prochlorperazine (COMPAZINE) injection 10 mg, 10 mg, Intravenous, Q6H PRN, [name], MD, 10 mg at 08/31/16 8921  Allergies:  Allergies  Allergen Reactions  . Codeine Nausea And Vomiting  . Hydrocodone Nausea And Vomiting    Past Medical History, Surgical history, Social history, and Family History were reviewed and updated.  Review of Systems: As above  Physical Exam:  weight is 152 lb (68.9 kg). His oral temperature is 97.5 F (36.4 C) (abnormal). His blood pressure is 157/96 (abnormal) and his pulse is 92. His respiration is 20 and oxygen saturation is 100%.   Well-developed and well-nourished white male. Head and neck exam shows no ocular or oral lesions. The are no palpable cervical or supraclavicular lymph nodes. Lungs are clear with no rales, wheezes or rhonchi. Cardiac exam regular rate and rhythm with no murmurs rubs or bruits. Abdomen is soft. He has good bowel sounds. There is no fluid wave. There is no palpable liver or spleen tip. Back exam shows no tenderness over the spine ribs or hips. I cannot elicit any tenderness over the sacrum bilaterally. He has good range of motion of his pelvis. Extremities shows no clubbing, cyanosis or edema. He has some weakness with plantar flexion of his right foot.   No erythema to the skin is noted. Neurological exam is nonfocal.   Lab Results  Component Value Date   WBC 9.4 08/31/2016   HGB 14.3 08/31/2016   HCT 42.7 08/31/2016   MCV 95 08/31/2016   PLT 305 08/31/2016      Chemistry      Component Value Date/Time   NA 139 08/31/2016 0758   NA 141 10/14/2015 0803   K 4.0 08/31/2016 0758   K 4.2 10/14/2015 0803   CL 100 08/31/2016 0758   CO2 31 08/31/2016 0758   CO2 27 10/14/2015 0803   BUN 12 08/31/2016 0758   BUN 17.9 10/14/2015 0803   CREATININE 1.1 08/31/2016 0758   CREATININE 1.2 10/14/2015 0803      Component Value Date/Time   CALCIUM 9.8 08/31/2016 0758   CALCIUM 10.0 10/14/2015 0803  ALKPHOS 84 08/31/2016 0758   ALKPHOS 78 10/14/2015 0803   AST 26 08/31/2016 0758   AST 17 10/14/2015 0803   ALT 15 08/31/2016 0758   ALT 12 10/14/2015 0803   BILITOT 0.80 08/31/2016 0758   BILITOT 0.45 10/14/2015 0803         Impression and Plan: Mr. Bedel is 71 year old gentleman with metastatic prostate cancer.   I think that it is apparent that there is a problem with his right S1 nerve root. Again, the problem is that we do not know if this is from metastatic disease or this is from degenerative disc disease and bone spur. He sees the orthopedic spine surgeon this morning.  He is really having a tough time with the XTANDI. We will hold this for right now. I do not see a problem with holding it. I just hate to see his quality of life be so compromised.   He'll get IV fluids. He looks well bit dehydrated. I will give him some Toradol. We'll try some Compazine and Pepcid.  I am not sure as to why he has the glossitis. I suppose it could be from the Dwight. We will try some Magic mouthwash for this.  We really need to keep close tabs on him. I want to see him back within 2 weeks.  We spent about 35 minutes with he and his family. I just feel bad that he is having such a tough time with his back. I suspect that we might be looking at surgery given his physical exam.    [name], MD 8/17/20189:57 AM

## 2016-08-31 NOTE — Patient Instructions (Signed)
Dehydration, Adult Dehydration is when there is not enough fluid or water in your body. This happens when you lose more fluids than you take in. Dehydration can range from mild to very bad. It should be treated right away to keep it from getting very bad. Symptoms of mild dehydration may include:  Thirst.  Dry lips.  Slightly dry mouth.  Dry, warm skin.  Dizziness. Symptoms of moderate dehydration may include:  Very dry mouth.  Muscle cramps.  Dark pee (urine). Pee may be the color of tea.  Your body making less pee.  Your eyes making fewer tears.  Heartbeat that is uneven or faster than normal (palpitations).  Headache.  Light-headedness, especially when you stand up from sitting.  Fainting (syncope). Symptoms of very bad dehydration may include:  Changes in skin, such as: ? Cold and clammy skin. ? Blotchy (mottled) or pale skin. ? Skin that does not quickly return to normal after being lightly pinched and let go (poor skin turgor).  Changes in body fluids, such as: ? Feeling very thirsty. ? Your eyes making fewer tears. ? Not sweating when body temperature is high, such as in hot weather. ? Your body making very little pee.  Changes in vital signs, such as: ? Weak pulse. ? Pulse that is more than 100 beats a minute when you are sitting still. ? Fast breathing. ? Low blood pressure.  Other changes, such as: ? Sunken eyes. ? Cold hands and feet. ? Confusion. ? Lack of energy (lethargy). ? Trouble waking up from sleep. ? Short-term weight loss. ? Unconsciousness. Follow these instructions at home:  If told by your doctor, drink an ORS: ? Make an ORS by using instructions on the package. ? Start by drinking small amounts, about  cup (120 mL) every 5-10 minutes. ? Slowly drink more until you have had the amount that your doctor said to have.  Drink enough clear fluid to keep your pee clear or pale yellow. If you were told to drink an ORS, finish the ORS  first, then start slowly drinking clear fluids. Drink fluids such as: ? Water. Do not drink only water by itself. Doing that can make the salt (sodium) level in your body get too low (hyponatremia). ? Ice chips. ? Fruit juice that you have added water to (diluted). ? Low-calorie sports drinks.  Avoid: ? Alcohol. ? Drinks that have a lot of sugar. These include high-calorie sports drinks, fruit juice that does not have water added, and soda. ? Caffeine. ? Foods that are greasy or have a lot of fat or sugar.  Take over-the-counter and prescription medicines only as told by your doctor.  Do not take salt tablets. Doing that can make the salt level in your body get too high (hypernatremia).  Eat foods that have minerals (electrolytes). Examples include bananas, oranges, potatoes, tomatoes, and spinach.  Keep all follow-up visits as told by your doctor. This is important. Contact a doctor if:  You have belly (abdominal) pain that: ? Gets worse. ? Stays in one area (localizes).  You have a rash.  You have a stiff neck.  You get angry or annoyed more easily than normal (irritability).  You are more sleepy than normal.  You have a harder time waking up than normal.  You feel: ? Weak. ? Dizzy. ? Very thirsty.  You have peed (urinated) only a small amount of very dark pee during 6-8 hours. Get help right away if:  You have symptoms of   very bad dehydration.  You cannot drink fluids without throwing up (vomiting).  Your symptoms get worse with treatment.  You have a fever.  You have a very bad headache.  You are throwing up or having watery poop (diarrhea) and it: ? Gets worse. ? Does not go away.  You have blood or something green (bile) in your throw-up.  You have blood in your poop (stool). This may cause poop to look black and tarry.  You have not peed in 6-8 hours.  You pass out (faint).  Your heart rate when you are sitting still is more than 100 beats a  minute.  You have trouble breathing. This information is not intended to replace advice given to you by your health care provider. Make sure you discuss any questions you have with your health care provider. Document Released: 10/28/2008 Document Revised: 07/22/2015 Document Reviewed: 02/25/2015 Elsevier Interactive Patient Education  2018 Elsevier Inc.  

## 2016-09-03 ENCOUNTER — Telehealth: Payer: Self-pay | Admitting: Cardiology

## 2016-09-03 DIAGNOSIS — R030 Elevated blood-pressure reading, without diagnosis of hypertension: Secondary | ICD-10-CM | POA: Diagnosis not present

## 2016-09-03 DIAGNOSIS — Z6825 Body mass index (BMI) 25.0-25.9, adult: Secondary | ICD-10-CM | POA: Diagnosis not present

## 2016-09-03 DIAGNOSIS — M5417 Radiculopathy, lumbosacral region: Secondary | ICD-10-CM | POA: Diagnosis not present

## 2016-09-03 NOTE — Telephone Encounter (Signed)
Can't handle wearing the monitor right now

## 2016-09-03 NOTE — Telephone Encounter (Signed)
S.w wife and she states that the pt is having a really bad time with chemo and pain from his tumor. They are seeing multiple providers for treatment and wife states that the pt is having a very difficult time dealing with this and the monitor. She asked if they could send it back because she feels like this is as well causing some misreading's on the monitor. I have encouraged on the process of sending the monitor. Wife verbalized understanding.

## 2016-09-06 ENCOUNTER — Other Ambulatory Visit: Payer: Self-pay | Admitting: Neurosurgery

## 2016-09-07 ENCOUNTER — Encounter (HOSPITAL_COMMUNITY): Payer: Self-pay

## 2016-09-07 ENCOUNTER — Encounter (HOSPITAL_COMMUNITY)
Admission: RE | Admit: 2016-09-07 | Discharge: 2016-09-07 | Disposition: A | Discharge status: Home / Self Care | Payer: Medicare Other | Source: Ambulatory Visit | Attending: Neurosurgery | Admitting: Neurosurgery

## 2016-09-07 DIAGNOSIS — C61 Malignant neoplasm of prostate: Secondary | ICD-10-CM | POA: Diagnosis not present

## 2016-09-07 DIAGNOSIS — Z01812 Encounter for preprocedural laboratory examination: Secondary | ICD-10-CM | POA: Insufficient documentation

## 2016-09-07 DIAGNOSIS — Z7984 Long term (current) use of oral hypoglycemic drugs: Secondary | ICD-10-CM | POA: Insufficient documentation

## 2016-09-07 DIAGNOSIS — R0600 Dyspnea, unspecified: Secondary | ICD-10-CM | POA: Diagnosis not present

## 2016-09-07 DIAGNOSIS — Z79891 Long term (current) use of opiate analgesic: Secondary | ICD-10-CM | POA: Diagnosis not present

## 2016-09-07 DIAGNOSIS — Z79899 Other long term (current) drug therapy: Secondary | ICD-10-CM | POA: Diagnosis not present

## 2016-09-07 DIAGNOSIS — I10 Essential (primary) hypertension: Secondary | ICD-10-CM | POA: Insufficient documentation

## 2016-09-07 HISTORY — DX: Type 2 diabetes mellitus without complications: E11.9

## 2016-09-07 HISTORY — DX: Other specified postprocedural states: Z98.890

## 2016-09-07 HISTORY — DX: Adverse effect of unspecified anesthetic, initial encounter: T41.45XA

## 2016-09-07 HISTORY — DX: Other complications of anesthesia, initial encounter: T88.59XA

## 2016-09-07 HISTORY — DX: Constipation, unspecified: K59.00

## 2016-09-07 HISTORY — DX: Nausea with vomiting, unspecified: R11.2

## 2016-09-07 HISTORY — DX: Dyspnea, unspecified: R06.00

## 2016-09-07 LAB — CBC
HCT: 42.9 % (ref 39.0–52.0)
Hemoglobin: 13.7 g/dL (ref 13.0–17.0)
MCH: 30 pg (ref 26.0–34.0)
MCHC: 31.9 g/dL (ref 30.0–36.0)
MCV: 94.1 fL (ref 78.0–100.0)
Platelets: 346 10*3/uL (ref 150–400)
RBC: 4.56 MIL/uL (ref 4.22–5.81)
RDW: 13.9 % (ref 11.5–15.5)
WBC: 9.6 10*3/uL (ref 4.0–10.5)

## 2016-09-07 LAB — BASIC METABOLIC PANEL
Anion gap: 12 (ref 5–15)
BUN: 14 mg/dL (ref 6–20)
CO2: 28 mmol/L (ref 22–32)
Calcium: 10.2 mg/dL (ref 8.9–10.3)
Chloride: 98 mmol/L — ABNORMAL LOW (ref 101–111)
Creatinine, Ser: 1.15 mg/dL (ref 0.61–1.24)
GFR calc Af Amer: >60 mL/min (ref >60)
GFR calc non Af Amer: >60 mL/min (ref >60)
Glucose, Bld: 92 mg/dL (ref 65–99)
Potassium: 3.9 mmol/L (ref 3.5–5.1)
Sodium: 138 mmol/L (ref 135–145)

## 2016-09-07 LAB — HEMOGLOBIN A1C
Hgb A1c MFr Bld: 6.2 % — ABNORMAL HIGH (ref 4.8–5.6)
Mean Plasma Glucose: 131.24 mg/dL

## 2016-09-07 LAB — SURGICAL PCR SCREEN
MRSA, PCR: NEGATIVE
Staphylococcus aureus: POSITIVE — AB

## 2016-09-07 NOTE — Progress Notes (Addendum)
How to Manage Your Diabetes Before and After Surgery  Why is it important to control my blood sugar before and after surgery? . Improving blood sugar levels before and after surgery helps healing and can limit problems. . A way of improving blood sugar control is eating a healthy diet by: o  Eating less sugar and carbohydrates o  Increasing activity/exercise o  Talking with your doctor about reaching your blood sugar goals . High blood sugars (greater than 180 mg/dL) can raise your risk of infections and slow your recovery, so you will need to focus on controlling your diabetes during the weeks before surgery. . Make sure that the doctor who takes care of your diabetes knows about your planned surgery including the date and location. How do I manage my blood sugar before surgery? . Check your blood sugar at least 4 times a day, starting 2 days before surgery, to make sure that the level is not too high or low. o Check your blood sugar the morning of your surgery when you wake up and every 2 hours until you get to the Short Stay unit. . If your blood sugar is less than 70 mg/dL, you will need to treat for low blood sugar: o Do not take insulin. o Treat a low blood sugar (less than 70 mg/dL) with  cup of clear juice (cranberry or apple), 4 glucose tablets, OR glucose gel. o Recheck blood sugar in 15 minutes after treatment (to make sure it is greater than 70 mg/dL). If your blood sugar is not greater than 70 mg/dL on recheck, call [phone] for further instructions. . Report your blood sugar to the short stay nurse when you get to Short Stay.  . If you are admitted to the hospital after surgery: o Your blood sugar will be checked by the staff and you will probably be given insulin after surgery (instead of oral diabetes medicines) to make sure you have good blood sugar levels. o The goal for blood sugar control after surgery is 80-180 mg/dL.  WHAT DO I DO ABOUT MY DIABETES MEDICATION? Marland Kitchen Do  not take oral diabetes medicines (pills) the morning of surgery.  Patient Signature:  Date:   Nurse Signature:  Date:

## 2016-09-07 NOTE — Progress Notes (Signed)
[name] denies chest pain, shortness of breath or  Palpations.  Patient does experience lightheadedness while ambulating at times, patient is now walking with a roll ator walker. Patient's PCP is Dr Carolanne Grumbling, Cardiologist is Dr Agustin Cree and Sherron Ales is Dr Marin Olp.  Dr Agustin Cree had a event monitor place on patient 08/22/16, patient has been in so much pain that he is unable to tolerate the monitor at this time.  Patient has Steroid induced diabetes- Dr Marin Olp orders Metformin , patient does not have a CBG.  I called and left a message on Nikki's voice  Mail asking if the office had notified Dr Anselm Pancoast of patient's surgery.

## 2016-09-07 NOTE — Progress Notes (Signed)
I called a prescription for Mupirocin ointment to [facility].

## 2016-09-07 NOTE — Pre-Procedure Instructions (Addendum)
Talin Feister  09/07/2016     Your procedure is scheduled on Wednesday, August 29.  Report to [facility] Admitting at 12:00 noon                   Your surgery or procedure is scheduled for 14:05 PM                     Call this number if you have problems the morning of surgery: [phone]- pre- op desk                    For any other questions, please call [phone], Monday - Friday 8 AM - 4 PM.   Remember:  Do not eat food or drink liquids after midnight Tuesday, August 28.  Take these medicines the morning of surgery with A SIP OF WATER: gabapentin (NEURONTIN), isosorbide mononitrate (IMDUR),  predniSONE (DELTASONE),  ZETIA.                    DO NOT TAKE Metformin the morning of surgery.  See separate sheet with more instructions regarding diabetes.                  Make take Tramadol or Dilaudid, nausea medication.            STOP taking Aspirin Products (Goody Powder, Excedrin Migraine), Ibuprofen (Advil), Naproxen (Aleve), Vitaminss and Herbal Products (ie Fish Oil)  Special instructions: [facility]- Preparing For Surgery  Before surgery, you can play an important role. Because skin is not sterile, your skin needs to be as free of germs as possible. You can reduce the number of germs on your skin by washing with CHG (chlorahexidine gluconate) Soap before surgery.  CHG is an antiseptic cleaner which kills germs and bonds with the skin to continue killing germs even after washing.  Please do not use if you have an allergy to CHG or antibacterial soaps. If your skin becomes reddened/irritated stop using the CHG.  Do not shave (including legs and underarms) for at least 48 hours prior to first CHG shower. It is OK to shave your face.  Please follow these instructions carefully.   1. Shower the NIGHT BEFORE SURGERY and the MORNING OF SURGERY with CHG.   2. If you chose to wash your hair, wash your hair first as usual with your normal shampoo.  3. After you  shampoo, rinse your hair and body thoroughly to remove the shampoo.    Wash your face and private area with the soap you use at home, then rinse.  4. Use CHG as you would any other liquid soap. You can apply CHG directly to the skin and wash gently with a scrungie or a clean washcloth.   5. Apply the CHG Soap to your body ONLY FROM THE NECK DOWN.  Do not use on open wounds or open sores. Avoid contact with your eyes, ears, mouth and genitals (private parts). Wash genitals (private parts) with your normal soap.  6. Wash thoroughly, paying special attention to the area where your surgery will be performed.  7. Thoroughly rinse your body with warm water from the neck down.  8. DO NOT shower/wash with your normal soap after using and rinsing off the CHG Soap.  9. Pat yourself dry with a CLEAN TOWEL.   10. Wear CLEAN PAJAMAS   11. Place CLEAN SHEETS on your bed the night of your first shower  and DO NOT SLEEP WITH PETS\  Day of Surgery: Shower as above  Do not apply any deodorants/lotions, powders or colognes. Please wear clean clothes to the hospital/surgery center.   Do not wear jewelry, make-up or nail polish.  Do not shave 48 hours prior to surgery.  Men may shave face and neck.  Do not bring valuables to the hospital.  [facility] is not responsible for any belongings or valuables.  Contacts, dentures or bridgework may not be worn into surgery.  Leave your suitcase in the car.  After surgery it may be brought to your room.  For patients admitted to the hospital, discharge time will be determined by your treatment team.  Patients discharged the day of surgery will not be allowed to drive home.   Please read over the following fact sheets that you were given:  Pain Booklet, Patient Instructions for Mupirocin Application, Incentive Spirometry, Surgical Site Infections.

## 2016-09-07 NOTE — Progress Notes (Signed)
   09/07/16 1018  OBSTRUCTIVE SLEEP APNEA  Have you ever been diagnosed with sleep apnea through a sleep study? No  Do you snore loudly (loud enough to be heard through closed doors)?  1 ("sometimes" )  Do you often feel tired, fatigued, or sleepy during the daytime (such as falling asleep during driving or talking to someone)? 0  Has anyone observed you stop breathing during your sleep? 1 (? pain related)  Do you have, or are you being treated for high blood pressure? 1  BMI more than 35 kg/m2? 0  Age > 50 (1-yes) 1  Neck circumference greater than:Male 16 inches or larger, Male 17inches or larger? 0  Male Gender (Yes=1) 1  Obstructive Sleep Apnea Score 5  Score 5 or greater  Results sent to PCP

## 2016-09-10 ENCOUNTER — Other Ambulatory Visit (HOSPITAL_BASED_OUTPATIENT_CLINIC_OR_DEPARTMENT_OTHER): Payer: Medicare Other

## 2016-09-10 ENCOUNTER — Telehealth: Payer: Self-pay | Admitting: Cardiology

## 2016-09-10 ENCOUNTER — Ambulatory Visit: Payer: Medicare Other

## 2016-09-10 ENCOUNTER — Ambulatory Visit (HOSPITAL_BASED_OUTPATIENT_CLINIC_OR_DEPARTMENT_OTHER): Payer: Medicare Other | Admitting: Hematology & Oncology

## 2016-09-10 VITALS — BP 158/86 | HR 108 | Temp 98.6°F | Resp 16 | Wt 147.0 lb

## 2016-09-10 DIAGNOSIS — C61 Malignant neoplasm of prostate: Secondary | ICD-10-CM

## 2016-09-10 DIAGNOSIS — K5903 Drug induced constipation: Secondary | ICD-10-CM | POA: Diagnosis not present

## 2016-09-10 DIAGNOSIS — I255 Ischemic cardiomyopathy: Secondary | ICD-10-CM

## 2016-09-10 DIAGNOSIS — Z01818 Encounter for other preprocedural examination: Secondary | ICD-10-CM

## 2016-09-10 DIAGNOSIS — C7951 Secondary malignant neoplasm of bone: Secondary | ICD-10-CM

## 2016-09-10 DIAGNOSIS — M79651 Pain in right thigh: Secondary | ICD-10-CM | POA: Diagnosis not present

## 2016-09-10 DIAGNOSIS — T402X5A Adverse effect of other opioids, initial encounter: Secondary | ICD-10-CM

## 2016-09-10 LAB — CMP (CANCER CENTER ONLY)
ALT(SGPT): 15 U/L (ref 10–47)
AST: 21 U/L (ref 11–38)
Albumin: 3.6 g/dL (ref 3.3–5.5)
Alkaline Phosphatase: 81 U/L (ref 26–84)
BUN, Bld: 13 mg/dL (ref 7–22)
CO2: 31 mEq/L (ref 18–33)
Calcium: 9.8 mg/dL (ref 8.0–10.3)
Chloride: 99 mEq/L (ref 98–108)
Creat: 1.3 mg/dl — ABNORMAL HIGH (ref 0.6–1.2)
Glucose, Bld: 142 mg/dL — ABNORMAL HIGH (ref 73–118)
Potassium: 3.6 mEq/L (ref 3.3–4.7)
Sodium: 141 mEq/L (ref 128–145)
Total Bilirubin: 0.6 mg/dl (ref 0.20–1.60)
Total Protein: 7.6 g/dL (ref 6.4–8.1)

## 2016-09-10 LAB — CBC WITH DIFFERENTIAL (CANCER CENTER ONLY)
BASO#: 0 10*3/uL (ref 0.0–0.2)
BASO%: 0.4 % (ref 0.0–2.0)
EOS%: 1.1 % (ref 0.0–7.0)
Eosinophils Absolute: 0.1 10*3/uL (ref 0.0–0.5)
HCT: 41.3 % (ref 38.7–49.9)
HGB: 13.6 g/dL (ref 13.0–17.1)
LYMPH#: 1 10*3/uL (ref 0.9–3.3)
LYMPH%: 13 % — ABNORMAL LOW (ref 14.0–48.0)
MCH: 31.3 pg (ref 28.0–33.4)
MCHC: 32.9 g/dL (ref 32.0–35.9)
MCV: 95 fL (ref 82–98)
MONO#: 0.6 10*3/uL (ref 0.1–0.9)
MONO%: 7.5 % (ref 0.0–13.0)
NEUT#: 6.1 10*3/uL (ref 1.5–6.5)
NEUT%: 78 % (ref 40.0–80.0)
Platelets: 322 10*3/uL (ref 145–400)
RBC: 4.34 10*6/uL (ref 4.20–5.70)
RDW: 13.1 % (ref 11.1–15.7)
WBC: 7.8 10*3/uL (ref 4.0–10.0)

## 2016-09-10 MED ORDER — NALOXEGOL OXALATE 12.5 MG PO TABS: 12.5000 mg | ORAL_TABLET | Freq: Every day | ORAL | 1 refills | Status: DC

## 2016-09-10 MED ORDER — LACTULOSE 10 GM/15ML PO SOLN: 30.0000 g | Freq: Three times a day (TID) | ORAL | 2 refills | Status: DC

## 2016-09-10 MED FILL — MOVANTIK 12.5 MG TABLET: 12.5 | 30 days supply | Qty: 30 | Fill #0

## 2016-09-10 MED FILL — LACTULOSE 10 GM/15 ML SOLN: 10 | 14 days supply | Qty: 1892 | Fill #0

## 2016-09-10 NOTE — Telephone Encounter (Signed)
Please advise 

## 2016-09-10 NOTE — Telephone Encounter (Signed)
Left message to return call 

## 2016-09-10 NOTE — Telephone Encounter (Signed)
Is having microscopic back surgery and wants a note today to be cleared for that

## 2016-09-10 NOTE — Progress Notes (Signed)
Hematology and Oncology Follow Up Visit  [name] [id number] Jun 23, 1945 71 y.o. 09/10/2016   Principle Diagnosis:   Metastatic castrate resistant prostate cancer  Current Therapy:    Zytiga 1000 mg by mouth daily - discontinued on 08/15/2016  Xtandi 160 mg po q day - start on 08/15/2016 - on hold  Xgeva 120 mg subcutaneous Q3 months - due in  August  Lupron 22 mg IM every 3 months - due in August  Radium-223 therapy -- s/p cycle #6  Palliative radiation to the right sacrum     Interim History:  [name] is back for followup. As expected, we knew that God would intervene. [name] saw Dr. Earle Gell of neurosurgery last Monday. This was a miracle. He is scheduled for surgery on Wednesday. He will have microsurgery. Dr. Arnoldo Morale thinks that this radicular pain is from an S1 tumor.  [name] comes walking with a walker. He does have some weakness in the right leg. There is no bowel or bladder issues. He is constipated. Constipation is from all the pain medication that he takes. I will call in some Movantik (12.5 mg by mouth daily) and somewhat lactulose elixir (30 mL by mouth 3 times a day) so that he will be "cleaned out" for his surgery.  Otherwise, he is doing okay. He has no cough. There is no shortness of breath. He is not having chest wall pain. He has seen his cardiologist. Everything turned out fairly good with respect to cardiology and not needing a cardiac cath.  We did try him on XTANDI. He did not tolerate this well. His glossitis has resolved.  He's had no fever. He's had no bleeding.  His appetite is marginal. His [place] and eat when he hurts. He's had some nausea but no vomiting. He has some vomiting with the XTANDI.   Overall, his performance status is ECOG 2.    Medications:  Current Outpatient Prescriptions:  .  acetaminophen (TYLENOL) 500 MG tablet, Take 1,000 mg by mouth every 4 (four) hours as needed for moderate pain., Disp: , Rfl:  .   aspirin 81 MG tablet, Take 81 mg by mouth daily., Disp: , Rfl:  .  Calcium Carbonate-Vitamin D (CALCIUM + D PO), Take 770 mg by mouth 2 (two) times daily. , Disp: , Rfl:  .  CALCIUM-IRON-VIT D-VIT K PO, Take 1 tablet by mouth 2 (two) times daily., Disp: , Rfl:  .  gabapentin (NEURONTIN) 300 MG capsule, TAKE 1 CAPSULE BY MOUTH TWICE A DAY THEN TAKE 2 CAPSULES AT BEDTIME, Disp: 120 capsule, Rfl: 4 .  HYDROmorphone (DILAUDID) 4 MG tablet, Take 1 tablet (4 mg total) by mouth every 6 (six) hours as needed for severe pain., Disp: 60 tablet, Rfl: 0 .  isosorbide mononitrate (IMDUR) 30 MG 24 hr tablet, Take 30 mg by mouth daily., Disp: , Rfl:  .  lactulose (CHRONULAC) 10 GM/15ML solution, Take 45 mLs (30 g total) by mouth 3 (three) times daily., Disp: 1892 mL, Rfl: 2 .  lidocaine (XYLOCAINE) 2 % solution, , Disp: , Rfl:  .  magic mouthwash w/lidocaine SOLN, Take 5 mLs by mouth 4 (four) times daily as needed for mouth pain., Disp: 500 mL, Rfl: 6 .  metFORMIN (GLUCOPHAGE-XR) 500 MG 24 hr tablet, TAKE TWO TABLETS BY MOUTH DAILY WITH BREAKFAST, Disp: 180 tablet, Rfl: 2 .  mupirocin ointment (BACTROBAN) 2 %, , Disp: , Rfl:  .  naloxegol oxalate (MOVANTIK) 12.5 MG TABS tablet, Take  1 tablet (12.5 mg total) by mouth daily., Disp: 30 tablet, Rfl: 1 .  ondansetron (ZOFRAN ODT) 4 MG disintegrating tablet, Take 1 tablet (4 mg total) by mouth every 8 (eight) hours as needed for nausea or vomiting., Disp: 30 tablet, Rfl: 2 .  OVER THE COUNTER MEDICATION, every morning. Juice Plus -- 8 capsule of each the garden, vineyard, and orchead, Disp: , Rfl:  .  predniSONE (DELTASONE) 5 MG tablet, TAKE 1 TABLET BY MOUTH DAILY, Disp: 60 tablet, Rfl: 3 .  prochlorperazine (COMPAZINE) 10 MG tablet, Take 1 tablet (10 mg total) by mouth every 6 (six) hours as needed for nausea or vomiting., Disp: 30 tablet, Rfl: 0 .  traMADol (ULTRAM) 50 MG tablet, Take 1 tablet (50 mg total) by mouth every 6 (six) hours as needed., Disp: 90 tablet,  Rfl: 2 .  UNABLE TO FIND, Med Name: Juice Plus Omega Blend, Disp: , Rfl:  .  UNABLE TO FIND, Med Name: Juice Plus [employer], Garden Blend, [employer], Disp: , Rfl:  .  VESICARE 5 MG tablet, TAKE 1 TABLET BY MOUTH DAILY. (Patient taking differently: TAKE 1 TABLET BY MOUTH TWICE A WEEK), Disp: 30 tablet, Rfl: 4 .  ZETIA 10 MG tablet, Take 10 mg by mouth daily. , Disp: , Rfl:   Allergies:  Allergies  Allergen Reactions  . Codeine Nausea And Vomiting  . Hydrocodone Nausea And Vomiting    Past Medical History, Surgical history, Social history, and Family History were reviewed and updated.  Review of Systems: As above  Physical Exam:  weight is 147 lb (66.7 kg). His oral temperature is 98.6 F (37 C). His blood pressure is 158/86 (abnormal) and his pulse is 108 (abnormal). His respiration is 16 and oxygen saturation is 99%.   Well-developed and well-nourished white male. Head and neck exam shows no ocular or oral lesions. There are no palpable cervical or supraclavicular lymph nodes. Lungs are clear bilaterally. Cardiac exam regular rate and rhythm with no murmurs, rubs or bruits. Abdomen is soft. He has decent bowel sounds. There is no fluid wave. There is no palpable liver or spleen tip. Back exam shows no tenderness over the spine, ribs or hips. There may be some slight tenderness to deep palpation at the right SI joint. Extremities does show weakness in the right leg. He has weakness in dorsiflexion and plantar flexion of the right foot. He's lost some muscle tone in the right thigh. Left leg is unremarkable. He has a positive straight leg raise on the right. Skin exam shows no rashes, ecchymoses or petechia. Neurological exam shows the changes above in the right leg.    Lab Results  Component Value Date   WBC 7.8 09/10/2016   HGB 13.6 09/10/2016   HCT 41.3 09/10/2016   MCV 95 09/10/2016   PLT 322 09/10/2016     Chemistry      Component Value Date/Time   NA 141 09/10/2016  1052   NA 141 10/14/2015 0803   K 3.6 09/10/2016 1052   K 4.2 10/14/2015 0803   CL 99 09/10/2016 1052   CO2 31 09/10/2016 1052   CO2 27 10/14/2015 0803   BUN 13 09/10/2016 1052   BUN 17.9 10/14/2015 0803   CREATININE 1.3 (H) 09/10/2016 1052   CREATININE 1.2 10/14/2015 0803      Component Value Date/Time   CALCIUM 9.8 09/10/2016 1052   CALCIUM 10.0 10/14/2015 0803   ALKPHOS 81 09/10/2016 1052   ALKPHOS 78 10/14/2015 0803  AST 21 09/10/2016 1052   AST 17 10/14/2015 0803   ALT 15 09/10/2016 1052   ALT 12 10/14/2015 0803   BILITOT 0.60 09/10/2016 1052   BILITOT 0.45 10/14/2015 0803         Impression and Plan: Mr. Laurel is 71 year old gentleman with metastatic prostate cancer.   I am very grateful to Dr. Arnoldo Morale for getting him to surgery so quickly. Surgery will be in 2 days.  I think will be incredibly important for [place] to get as much specimen so that we can run some genetic markers.  This really should help with his weakness.  I told him that we probably are going to have to switch over to systemic chemotherapy. He has been on [place]. He could not tolerate full dose XTANDI. Possibly, we might be able to decrease his XTANDI dose.  This is a really, located situation. He has had radiation. He has been on oral medicine. As such, Taxotere might be the next step for [place].  I spent about 40 minutes with he and his wife and sister. I answered all their questions. We will see him after his surgery in the hospital.   [name], MD 8/27/201812:51 PM

## 2016-09-10 NOTE — Progress Notes (Addendum)
Anesthesia Chart Review: Patient is a 72 year old male scheduled for laminectomy and foraminotomy right L5-S1, S1-2 decompression of right S1 nerve root on 09/12/16 by Dr. [name].  History includes never smoker, post-operative N/V, HTN, exertional dyspnea, prostate cancer s/p robotic assisted laparoscopic prostatectomy '12 with METS to lungs and right sacrum; s/p sacral radiation 04/2014, prednisone induced DM/hyperglycemia, T&A. OSA screening score is 5.   - PCP is Dr. Nelda Bucks.  - Cardiologist is Dr. Jenne Campus. Last visit 08/09/16. Medical therapy ordered for mildly abnormal stress test. Would consider cath if he developed chest pain. Event monitor ordered to evaluate dizziness, but patient could not tolerate due to his pain. Patient has refused any additional cholesterol medication (he is on Zetia).  - HEM-ONC is Dr. Burney Gauze. Last visit 09/10/16. XTANDI on hold due to N/V. - RAD-ONC is Dr. [name].  Meds include ASA 81 mg (on hold), Neurontin, hydromorphone, Imdur, Magic Mouthwash, metformin, Zofran, prednisone, prochlorperazine, tramadol, Zetia, Juice Plus supplement, Vesicare. (Zytiga disontinued 08/15/16, Xtandi on hold 08/15/16, Xgeva and Lupron due August.)   BP (!) 167/81   Pulse 96   Temp 36.7 C   Resp 20   Ht 5\' 8"  (1.727 m)   Wt 150 lb 8 oz (68.3 kg)   SpO2 99%   BMI 22.88 kg/m    EKG 08/09/16: NSR.  Nuclear stress test: By notes, "mildly abnormal"--otherwise unclear results and date (mentioned in notes dating back to 01/25/15). Dr. Agustin Cree does not have the report at his current office with [facility]. I requested from his previous office ([facility]), but return fax is pending.  PET scan 08/14/16: IMPRESSION: 1. Interval increase in size of bilateral pulmonary nodules. Two of the larger nodules at the lung bases have associated radiotracer activity. Findings are consistent with prostate cancer metastasis. 2. No  evidence of local recurrence in the prostate bed. 3. No evidence of metastatic adenopathy in the abdomen or pelvis.  Labs from 09/07/16 and 09/10/16 noted. Cr 1.3. H/H 13.6/41.3. PLT 322.  Glucose 142. A1c on 09/07/16 was 6.2.   Patient denied chest pain, SOB, or palpitations on 09/07/16. However, he was recently unable to complete his event monitor and has declined cardiac cath in the past for "mildly abnormal stress test" (unclear details; attempting to get stress test results). He was recently evaluated by Dr. Agustin Cree (one month ago) and appears that he was in agreement with continued medical therapy unless patient more symptomatic. Now with pending surgery would recommend cardiology input. Nicki at Dr. Arnoldo Morale' office will request clearance. Patient has also contacted Dr. Wendy Poet office.   [facility] Short Stay Center/Anesthesiology Phone [phone] 09/10/2016 2:07 PM  Addendum:   Dr. Agustin Cree ordered an echo, results below, and then cleared pt for surgery.   Echo 09/11/16 [facility]):  1. Concentric LVH with hypercontractile function, EF 70%. No regional wall motion abnormality. Grade 1 diastolic dysfunction. 2. Normal LA size. 3. Normal right heart structures and pressure. 4. Mitral valve sclerosis with 2 calcified nodules noted on anterior leaflet. Trace mitral regurgitation. 5. Aortic sclerosis without stenosis or regurgitation. 6. 10 mm resting gradient across LV outflow tract. No Valsalva performed. 7. No pericardial effusion.  If no changes, I anticipate pt can proceed with surgery as scheduled.   Willeen Cass, FNP-BC [facility] Short Stay Surgical Center/Anesthesiology Phone: [phone] 09/11/2016 2:53 PM

## 2016-09-10 NOTE — Telephone Encounter (Signed)
Sees Dr Raliegh Ip

## 2016-09-10 NOTE — Telephone Encounter (Signed)
Spoke with Dr. Agustin Cree, advised for patient to have an echocardiogram. Echocardiogram scheduled at [facility] 09/11/16, patient to arrive at 8:15 am. Patient made aware of appointment date and time. Will fax clearance upon receiving results.

## 2016-09-11 ENCOUNTER — Other Ambulatory Visit: Payer: Self-pay

## 2016-09-11 DIAGNOSIS — Z01818 Encounter for other preprocedural examination: Secondary | ICD-10-CM | POA: Diagnosis not present

## 2016-09-11 DIAGNOSIS — I509 Heart failure, unspecified: Secondary | ICD-10-CM | POA: Diagnosis not present

## 2016-09-11 DIAGNOSIS — I255 Ischemic cardiomyopathy: Secondary | ICD-10-CM

## 2016-09-11 DIAGNOSIS — I209 Angina pectoris, unspecified: Secondary | ICD-10-CM | POA: Diagnosis not present

## 2016-09-11 LAB — TESTOSTERONE: Testosterone, Serum: <3 ng/dL — ABNORMAL LOW (ref 264–916)

## 2016-09-11 NOTE — Addendum Note (Signed)
Addended by: Kathyrn Sheriff on: 09/11/2016 03:53 PM   Modules accepted: Orders

## 2016-09-12 ENCOUNTER — Ambulatory Visit (HOSPITAL_COMMUNITY)
Admission: RE | Admit: 2016-09-12 | Discharge: 2016-09-13 | Disposition: A | Discharge status: Home / Self Care | Payer: Medicare Other | Source: Ambulatory Visit | Attending: Neurosurgery | Admitting: Neurosurgery

## 2016-09-12 ENCOUNTER — Ambulatory Visit (HOSPITAL_COMMUNITY): Payer: Medicare Other

## 2016-09-12 ENCOUNTER — Encounter (HOSPITAL_COMMUNITY)
Admission: RE | Disposition: A | Discharge status: Home / Self Care | Payer: Self-pay | Source: Ambulatory Visit | Attending: Neurosurgery

## 2016-09-12 ENCOUNTER — Ambulatory Visit (HOSPITAL_COMMUNITY): Payer: Medicare Other | Admitting: Anesthesiology

## 2016-09-12 ENCOUNTER — Ambulatory Visit (HOSPITAL_COMMUNITY): Payer: Medicare Other | Admitting: Vascular Surgery

## 2016-09-12 ENCOUNTER — Encounter (HOSPITAL_COMMUNITY): Payer: Self-pay | Admitting: General Practice

## 2016-09-12 DIAGNOSIS — C61 Malignant neoplasm of prostate: Secondary | ICD-10-CM | POA: Diagnosis not present

## 2016-09-12 DIAGNOSIS — C7951 Secondary malignant neoplasm of bone: Secondary | ICD-10-CM | POA: Diagnosis present

## 2016-09-12 DIAGNOSIS — Z7982 Long term (current) use of aspirin: Secondary | ICD-10-CM | POA: Diagnosis not present

## 2016-09-12 DIAGNOSIS — Z7952 Long term (current) use of systemic steroids: Secondary | ICD-10-CM | POA: Insufficient documentation

## 2016-09-12 DIAGNOSIS — Z419 Encounter for procedure for purposes other than remedying health state, unspecified: Secondary | ICD-10-CM

## 2016-09-12 DIAGNOSIS — M5418 Radiculopathy, sacral and sacrococcygeal region: Secondary | ICD-10-CM | POA: Insufficient documentation

## 2016-09-12 DIAGNOSIS — C7989 Secondary malignant neoplasm of other specified sites: Secondary | ICD-10-CM | POA: Diagnosis not present

## 2016-09-12 DIAGNOSIS — Z923 Personal history of irradiation: Secondary | ICD-10-CM | POA: Insufficient documentation

## 2016-09-12 DIAGNOSIS — I10 Essential (primary) hypertension: Secondary | ICD-10-CM | POA: Diagnosis not present

## 2016-09-12 DIAGNOSIS — E119 Type 2 diabetes mellitus without complications: Secondary | ICD-10-CM | POA: Diagnosis not present

## 2016-09-12 DIAGNOSIS — M5417 Radiculopathy, lumbosacral region: Secondary | ICD-10-CM | POA: Diagnosis not present

## 2016-09-12 DIAGNOSIS — M79606 Pain in leg, unspecified: Secondary | ICD-10-CM | POA: Diagnosis present

## 2016-09-12 DIAGNOSIS — Z7984 Long term (current) use of oral hypoglycemic drugs: Secondary | ICD-10-CM | POA: Diagnosis not present

## 2016-09-12 DIAGNOSIS — Z85830 Personal history of malignant neoplasm of bone: Secondary | ICD-10-CM | POA: Diagnosis not present

## 2016-09-12 DIAGNOSIS — E785 Hyperlipidemia, unspecified: Secondary | ICD-10-CM | POA: Diagnosis not present

## 2016-09-12 DIAGNOSIS — Z85118 Personal history of other malignant neoplasm of bronchus and lung: Secondary | ICD-10-CM | POA: Diagnosis not present

## 2016-09-12 DIAGNOSIS — M5116 Intervertebral disc disorders with radiculopathy, lumbar region: Secondary | ICD-10-CM | POA: Diagnosis not present

## 2016-09-12 DIAGNOSIS — Z79899 Other long term (current) drug therapy: Secondary | ICD-10-CM | POA: Insufficient documentation

## 2016-09-12 DIAGNOSIS — Z8546 Personal history of malignant neoplasm of prostate: Secondary | ICD-10-CM | POA: Insufficient documentation

## 2016-09-12 DIAGNOSIS — C801 Malignant (primary) neoplasm, unspecified: Secondary | ICD-10-CM | POA: Diagnosis not present

## 2016-09-12 HISTORY — DX: Secondary malignant neoplasm of bone: C79.51

## 2016-09-12 HISTORY — PX: LUMBAR LAMINECTOMY/DECOMPRESSION MICRODISCECTOMY: SHX5026

## 2016-09-12 LAB — GLUCOSE, CAPILLARY
Glucose-Capillary: 105 mg/dL — ABNORMAL HIGH (ref 65–99)
Glucose-Capillary: 122 mg/dL — ABNORMAL HIGH (ref 65–99)
Glucose-Capillary: 153 mg/dL — ABNORMAL HIGH (ref 65–99)

## 2016-09-12 SURGERY — LUMBAR LAMINECTOMY/DECOMPRESSION MICRODISCECTOMY 2 LEVELS
Anesthesia: General | Site: Back | Laterality: Right

## 2016-09-12 MED ORDER — PROPOFOL 10 MG/ML IV BOLUS
INTRAVENOUS | Status: DC | PRN
  Administered 2016-09-12: 90 mg via INTRAVENOUS
  Administered 2016-09-12: 100 mg via INTRAVENOUS

## 2016-09-12 MED ORDER — HYDROMORPHONE HCL 2 MG PO TABS
4.0000 mg | ORAL_TABLET | ORAL | Status: DC | PRN
  Administered 2016-09-13: 4 mg via ORAL
  Filled 2016-09-12: qty 2

## 2016-09-12 MED ORDER — CYCLOBENZAPRINE HCL 10 MG PO TABS: 10.0000 mg | ORAL_TABLET | Freq: Three times a day (TID) | ORAL | Status: DC | PRN

## 2016-09-12 MED ORDER — ROCURONIUM BROMIDE 10 MG/ML (PF) SYRINGE
PREFILLED_SYRINGE | INTRAVENOUS | Status: DC | PRN
  Administered 2016-09-12: 70 mg via INTRAVENOUS
  Administered 2016-09-12: 10 mg via INTRAVENOUS

## 2016-09-12 MED ORDER — CHLORHEXIDINE GLUCONATE CLOTH 2 % EX PADS: 6.0000 | MEDICATED_PAD | Freq: Once | CUTANEOUS | Status: DC

## 2016-09-12 MED ORDER — DOCUSATE SODIUM 100 MG PO CAPS
100.0000 mg | ORAL_CAPSULE | Freq: Two times a day (BID) | ORAL | Status: DC
  Administered 2016-09-12 – 2016-09-13 (×2): 100 mg via ORAL
  Filled 2016-09-12 (×2): qty 1

## 2016-09-12 MED ORDER — BACITRACIN ZINC 500 UNIT/GM EX OINT
TOPICAL_OINTMENT | CUTANEOUS | Status: DC | PRN
  Administered 2016-09-12: 1 via TOPICAL

## 2016-09-12 MED ORDER — PHENYLEPHRINE HCL 10 MG/ML IJ SOLN
INTRAMUSCULAR | Status: DC | PRN
  Administered 2016-09-12: 25 ug/min via INTRAVENOUS

## 2016-09-12 MED ORDER — MIDAZOLAM HCL 5 MG/5ML IJ SOLN
INTRAMUSCULAR | Status: DC | PRN
  Administered 2016-09-12 (×2): 1 mg via INTRAVENOUS

## 2016-09-12 MED ORDER — ACETAMINOPHEN 325 MG PO TABS: 650.0000 mg | ORAL_TABLET | ORAL | Status: DC | PRN

## 2016-09-12 MED ORDER — MIDAZOLAM HCL 2 MG/2ML IJ SOLN
INTRAMUSCULAR | Status: AC
  Filled 2016-09-12: qty 2

## 2016-09-12 MED ORDER — PROCHLORPERAZINE MALEATE 10 MG PO TABS: 10.0000 mg | ORAL_TABLET | Freq: Four times a day (QID) | ORAL | Status: DC | PRN

## 2016-09-12 MED ORDER — PHENYLEPHRINE 40 MCG/ML (10ML) SYRINGE FOR IV PUSH (FOR BLOOD PRESSURE SUPPORT)
PREFILLED_SYRINGE | INTRAVENOUS | Status: DC | PRN
  Administered 2016-09-12: 80 ug via INTRAVENOUS

## 2016-09-12 MED ORDER — ISOSORBIDE MONONITRATE ER 30 MG PO TB24
30.0000 mg | ORAL_TABLET | Freq: Every day | ORAL | Status: DC
  Administered 2016-09-13: 30 mg via ORAL
  Filled 2016-09-12: qty 1

## 2016-09-12 MED ORDER — BACITRACIN ZINC 500 UNIT/GM EX OINT
TOPICAL_OINTMENT | CUTANEOUS | Status: AC
  Filled 2016-09-12: qty 28.35

## 2016-09-12 MED ORDER — KETAMINE HCL-SODIUM CHLORIDE 100-0.9 MG/10ML-% IV SOSY
PREFILLED_SYRINGE | INTRAVENOUS | Status: AC
  Filled 2016-09-12: qty 10

## 2016-09-12 MED ORDER — FENTANYL CITRATE (PF) 250 MCG/5ML IJ SOLN
INTRAMUSCULAR | Status: AC
  Filled 2016-09-12: qty 5

## 2016-09-12 MED ORDER — ONDANSETRON HCL 4 MG/2ML IJ SOLN
INTRAMUSCULAR | Status: AC
  Filled 2016-09-12: qty 2

## 2016-09-12 MED ORDER — BUPIVACAINE-EPINEPHRINE (PF) 0.5% -1:200000 IJ SOLN
INTRAMUSCULAR | Status: AC
  Filled 2016-09-12: qty 30

## 2016-09-12 MED ORDER — FENTANYL CITRATE (PF) 100 MCG/2ML IJ SOLN
INTRAMUSCULAR | Status: DC | PRN
  Administered 2016-09-12: 100 ug via INTRAVENOUS
  Administered 2016-09-12 (×4): 50 ug via INTRAVENOUS

## 2016-09-12 MED ORDER — METFORMIN HCL ER 500 MG PO TB24
500.0000 mg | ORAL_TABLET | Freq: Every day | ORAL | Status: DC
  Administered 2016-09-13: 500 mg via ORAL
  Filled 2016-09-12: qty 1

## 2016-09-12 MED ORDER — INSULIN ASPART 100 UNIT/ML ~~LOC~~ SOLN: 0.0000 [IU] | SUBCUTANEOUS | Status: DC

## 2016-09-12 MED ORDER — DARIFENACIN HYDROBROMIDE ER 7.5 MG PO TB24
7.5000 mg | ORAL_TABLET | Freq: Every day | ORAL | Status: DC
  Administered 2016-09-13: 7.5 mg via ORAL
  Filled 2016-09-12: qty 1

## 2016-09-12 MED ORDER — INSULIN ASPART 100 UNIT/ML ~~LOC~~ SOLN: 0.0000 [IU] | Freq: Three times a day (TID) | SUBCUTANEOUS | Status: DC

## 2016-09-12 MED ORDER — BUPIVACAINE-EPINEPHRINE (PF) 0.5% -1:200000 IJ SOLN
INTRAMUSCULAR | Status: DC | PRN
  Administered 2016-09-12: 20 mL

## 2016-09-12 MED ORDER — THROMBIN 5000 UNITS EX SOLR
OROMUCOSAL | Status: DC | PRN
  Administered 2016-09-12: 5 mL via TOPICAL

## 2016-09-12 MED ORDER — KETAMINE HCL 10 MG/ML IJ SOLN
INTRAMUSCULAR | Status: DC | PRN
  Administered 2016-09-12: 10 mg via INTRAVENOUS
  Administered 2016-09-12: 40 mg via INTRAVENOUS
  Administered 2016-09-12: 20 mg via INTRAVENOUS

## 2016-09-12 MED ORDER — 0.9 % SODIUM CHLORIDE (POUR BTL) OPTIME
TOPICAL | Status: DC | PRN
  Administered 2016-09-12: 1000 mL

## 2016-09-12 MED ORDER — PROPOFOL 10 MG/ML IV BOLUS
INTRAVENOUS | Status: AC
  Filled 2016-09-12: qty 20

## 2016-09-12 MED ORDER — TRAMADOL HCL 50 MG PO TABS
50.0000 mg | ORAL_TABLET | Freq: Four times a day (QID) | ORAL | Status: DC | PRN
  Administered 2016-09-12 – 2016-09-13 (×2): 50 mg via ORAL
  Filled 2016-09-12 (×2): qty 1

## 2016-09-12 MED ORDER — ONDANSETRON 4 MG PO TBDP: 4.0000 mg | ORAL_TABLET | Freq: Three times a day (TID) | ORAL | Status: DC | PRN

## 2016-09-12 MED ORDER — BACITRACIN 50000 UNITS IM SOLR
INTRAMUSCULAR | Status: DC | PRN
  Administered 2016-09-12: 500 mL

## 2016-09-12 MED ORDER — NALOXEGOL OXALATE 12.5 MG PO TABS
12.5000 mg | ORAL_TABLET | Freq: Every day | ORAL | Status: DC
  Administered 2016-09-13: 12.5 mg via ORAL
  Filled 2016-09-12: qty 1

## 2016-09-12 MED ORDER — PHENYLEPHRINE 40 MCG/ML (10ML) SYRINGE FOR IV PUSH (FOR BLOOD PRESSURE SUPPORT)
PREFILLED_SYRINGE | INTRAVENOUS | Status: AC
  Filled 2016-09-12: qty 10

## 2016-09-12 MED ORDER — BISACODYL 10 MG RE SUPP: 10.0000 mg | Freq: Every day | RECTAL | Status: DC | PRN

## 2016-09-12 MED ORDER — THROMBIN 5000 UNITS EX SOLR
CUTANEOUS | Status: AC
  Filled 2016-09-12: qty 15000

## 2016-09-12 MED ORDER — SODIUM CHLORIDE 0.9% FLUSH: 3.0000 mL | INTRAVENOUS | Status: DC | PRN

## 2016-09-12 MED ORDER — ONDANSETRON HCL 4 MG/2ML IJ SOLN
INTRAMUSCULAR | Status: DC | PRN
  Administered 2016-09-12: 4 mg via INTRAVENOUS

## 2016-09-12 MED ORDER — CEFAZOLIN SODIUM-DEXTROSE 2-4 GM/100ML-% IV SOLN
2.0000 g | Freq: Three times a day (TID) | INTRAVENOUS | Status: AC
  Administered 2016-09-12 – 2016-09-13 (×2): 2 g via INTRAVENOUS
  Filled 2016-09-12 (×2): qty 100

## 2016-09-12 MED ORDER — DEXAMETHASONE SODIUM PHOSPHATE 10 MG/ML IJ SOLN
INTRAMUSCULAR | Status: DC | PRN
  Administered 2016-09-12: 10 mg via INTRAVENOUS

## 2016-09-12 MED ORDER — LACTATED RINGERS IV SOLN
INTRAVENOUS | Status: DC
  Administered 2016-09-12 (×2): via INTRAVENOUS

## 2016-09-12 MED ORDER — HYDROMORPHONE HCL 1 MG/ML IJ SOLN: 0.2500 mg | INTRAMUSCULAR | Status: DC | PRN

## 2016-09-12 MED ORDER — HEMOSTATIC AGENTS (NO CHARGE) OPTIME
TOPICAL | Status: DC | PRN
  Administered 2016-09-12: 1 via TOPICAL

## 2016-09-12 MED ORDER — HYDROCORTISONE NA SUCCINATE PF 100 MG IJ SOLR
100.0000 mg | Freq: Three times a day (TID) | INTRAMUSCULAR | Status: DC
  Administered 2016-09-12 – 2016-09-13 (×2): 100 mg via INTRAVENOUS
  Filled 2016-09-12 (×4): qty 2

## 2016-09-12 MED ORDER — PHENOL 1.4 % MT LIQD: 1.0000 | OROMUCOSAL | Status: DC | PRN

## 2016-09-12 MED ORDER — THROMBIN 5000 UNITS EX SOLR
CUTANEOUS | Status: DC | PRN
  Administered 2016-09-12 (×2): 5000 [IU] via TOPICAL

## 2016-09-12 MED ORDER — ACETAMINOPHEN 650 MG RE SUPP: 650.0000 mg | RECTAL | Status: DC | PRN

## 2016-09-12 MED ORDER — LACTULOSE 10 GM/15ML PO SOLN
30.0000 g | Freq: Three times a day (TID) | ORAL | Status: DC
  Filled 2016-09-12 (×5): qty 45

## 2016-09-12 MED ORDER — MENTHOL 3 MG MT LOZG: 1.0000 | LOZENGE | OROMUCOSAL | Status: DC | PRN

## 2016-09-12 MED ORDER — SUGAMMADEX SODIUM 200 MG/2ML IV SOLN
INTRAVENOUS | Status: AC
  Filled 2016-09-12: qty 2

## 2016-09-12 MED ORDER — ONDANSETRON HCL 4 MG PO TABS
4.0000 mg | ORAL_TABLET | Freq: Four times a day (QID) | ORAL | Status: DC | PRN
  Administered 2016-09-13: 4 mg via ORAL
  Filled 2016-09-12: qty 1

## 2016-09-12 MED ORDER — FENTANYL CITRATE (PF) 100 MCG/2ML IJ SOLN
INTRAMUSCULAR | Status: AC
  Administered 2016-09-12: 50 ug via INTRAVENOUS
  Filled 2016-09-12: qty 2

## 2016-09-12 MED ORDER — EZETIMIBE 10 MG PO TABS
10.0000 mg | ORAL_TABLET | Freq: Every day | ORAL | Status: DC
  Administered 2016-09-13: 10 mg via ORAL
  Filled 2016-09-12: qty 1

## 2016-09-12 MED ORDER — GABAPENTIN 300 MG PO CAPS
300.0000 mg | ORAL_CAPSULE | Freq: Three times a day (TID) | ORAL | Status: DC
  Administered 2016-09-12 – 2016-09-13 (×2): 300 mg via ORAL
  Filled 2016-09-12 (×2): qty 1

## 2016-09-12 MED ORDER — CEFAZOLIN SODIUM-DEXTROSE 2-4 GM/100ML-% IV SOLN
2.0000 g | INTRAVENOUS | Status: AC
  Administered 2016-09-12: 2 g via INTRAVENOUS
  Filled 2016-09-12: qty 100

## 2016-09-12 MED ORDER — FENTANYL CITRATE (PF) 100 MCG/2ML IJ SOLN
50.0000 ug | Freq: Once | INTRAMUSCULAR | Status: AC
  Administered 2016-09-12: 50 ug via INTRAVENOUS

## 2016-09-12 MED ORDER — LIDOCAINE 2% (20 MG/ML) 5 ML SYRINGE
INTRAMUSCULAR | Status: DC | PRN
  Administered 2016-09-12: 100 mg via INTRAVENOUS

## 2016-09-12 MED ORDER — SODIUM CHLORIDE 0.9% FLUSH: 3.0000 mL | Freq: Two times a day (BID) | INTRAVENOUS | Status: DC

## 2016-09-12 MED ORDER — PROMETHAZINE HCL 25 MG/ML IJ SOLN: 6.2500 mg | INTRAMUSCULAR | Status: DC | PRN

## 2016-09-12 MED ORDER — MORPHINE SULFATE (PF) 4 MG/ML IV SOLN: 4.0000 mg | INTRAVENOUS | Status: DC | PRN

## 2016-09-12 MED ORDER — PREDNISONE 5 MG PO TABS
5.0000 mg | ORAL_TABLET | Freq: Every day | ORAL | Status: DC
  Administered 2016-09-13: 5 mg via ORAL
  Filled 2016-09-12: qty 1

## 2016-09-12 MED ORDER — ONDANSETRON HCL 4 MG/2ML IJ SOLN: 4.0000 mg | Freq: Four times a day (QID) | INTRAMUSCULAR | Status: DC | PRN

## 2016-09-12 MED ORDER — MAGIC MOUTHWASH W/LIDOCAINE
5.0000 mL | Freq: Four times a day (QID) | ORAL | Status: DC | PRN
  Filled 2016-09-12: qty 5

## 2016-09-12 MED ORDER — SUGAMMADEX SODIUM 200 MG/2ML IV SOLN
INTRAVENOUS | Status: DC | PRN
  Administered 2016-09-12: 135 mg via INTRAVENOUS

## 2016-09-12 SURGICAL SUPPLY — 56 items
BAG DECANTER FOR FLEXI CONT (MISCELLANEOUS) ×3 IMPLANT
BENZOIN TINCTURE PRP APPL 2/3 (GAUZE/BANDAGES/DRESSINGS) ×3 IMPLANT
BLADE CLIPPER SURG (BLADE) IMPLANT
BUR MATCHSTICK NEURO 3.0 LAGG (BURR) ×3 IMPLANT
BUR PRECISION FLUTE 6.0 (BURR) ×3 IMPLANT
CANISTER SUCT 3000ML PPV (MISCELLANEOUS) ×3 IMPLANT
CARTRIDGE OIL MAESTRO DRILL (MISCELLANEOUS) ×1 IMPLANT
CLOSURE STERI-STRIP 1/2X4 (GAUZE/BANDAGES/DRESSINGS) ×1
CLOSURE WOUND 1/2 X4 (GAUZE/BANDAGES/DRESSINGS) ×1
CLSR STERI-STRIP ANTIMIC 1/2X4 (GAUZE/BANDAGES/DRESSINGS) ×2 IMPLANT
DIFFUSER DRILL AIR PNEUMATIC (MISCELLANEOUS) ×3 IMPLANT
DRAPE LAPAROTOMY 100X72X124 (DRAPES) ×3 IMPLANT
DRAPE MICROSCOPE LEICA (MISCELLANEOUS) ×3 IMPLANT
DRAPE POUCH INSTRU U-SHP 10X18 (DRAPES) ×3 IMPLANT
DRAPE SURG 17X23 STRL (DRAPES) ×12 IMPLANT
ELECT BLADE 4.0 EZ CLEAN MEGAD (MISCELLANEOUS) ×3
ELECT REM PT RETURN 9FT ADLT (ELECTROSURGICAL) ×3
ELECTRODE BLDE 4.0 EZ CLN MEGD (MISCELLANEOUS) ×1 IMPLANT
ELECTRODE REM PT RTRN 9FT ADLT (ELECTROSURGICAL) ×1 IMPLANT
GAUZE SPONGE 4X4 12PLY STRL (GAUZE/BANDAGES/DRESSINGS) IMPLANT
GAUZE SPONGE 4X4 12PLY STRL LF (GAUZE/BANDAGES/DRESSINGS) ×3 IMPLANT
GAUZE SPONGE 4X4 16PLY XRAY LF (GAUZE/BANDAGES/DRESSINGS) IMPLANT
GLOVE BIO SURGEON STRL SZ8 (GLOVE) ×6 IMPLANT
GLOVE BIO SURGEON STRL SZ8.5 (GLOVE) ×6 IMPLANT
GLOVE BIOGEL PI IND STRL 7.0 (GLOVE) ×1 IMPLANT
GLOVE BIOGEL PI IND STRL 7.5 (GLOVE) ×1 IMPLANT
GLOVE BIOGEL PI INDICATOR 7.0 (GLOVE) ×2
GLOVE BIOGEL PI INDICATOR 7.5 (GLOVE) ×2
GLOVE EXAM NITRILE LRG STRL (GLOVE) IMPLANT
GLOVE EXAM NITRILE XL STR (GLOVE) IMPLANT
GLOVE EXAM NITRILE XS STR PU (GLOVE) IMPLANT
GLOVE SURG SS PI 7.5 STRL IVOR (GLOVE) ×6 IMPLANT
GOWN STRL REUS W/ TWL LRG LVL3 (GOWN DISPOSABLE) ×1 IMPLANT
GOWN STRL REUS W/ TWL XL LVL3 (GOWN DISPOSABLE) ×1 IMPLANT
GOWN STRL REUS W/TWL 2XL LVL3 (GOWN DISPOSABLE) IMPLANT
GOWN STRL REUS W/TWL LRG LVL3 (GOWN DISPOSABLE) ×2
GOWN STRL REUS W/TWL XL LVL3 (GOWN DISPOSABLE) ×2
KIT BASIN OR (CUSTOM PROCEDURE TRAY) ×3 IMPLANT
KIT ROOM TURNOVER OR (KITS) ×3 IMPLANT
NEEDLE HYPO 21X1.5 SAFETY (NEEDLE) IMPLANT
NEEDLE HYPO 22GX1.5 SAFETY (NEEDLE) ×3 IMPLANT
NS IRRIG 1000ML POUR BTL (IV SOLUTION) ×3 IMPLANT
OIL CARTRIDGE MAESTRO DRILL (MISCELLANEOUS) ×3
PACK LAMINECTOMY NEURO (CUSTOM PROCEDURE TRAY) ×3 IMPLANT
PAD ARMBOARD 7.5X6 YLW CONV (MISCELLANEOUS) ×15 IMPLANT
PATTIES SURGICAL .5 X1 (DISPOSABLE) IMPLANT
RUBBERBAND STERILE (MISCELLANEOUS) ×6 IMPLANT
SPONGE SURGIFOAM ABS GEL SZ50 (HEMOSTASIS) ×3 IMPLANT
STRIP CLOSURE SKIN 1/2X4 (GAUZE/BANDAGES/DRESSINGS) ×2 IMPLANT
SUT VIC AB 1 CT1 18XBRD ANBCTR (SUTURE) ×1 IMPLANT
SUT VIC AB 1 CT1 8-18 (SUTURE) ×2
SUT VIC AB 2-0 CP2 18 (SUTURE) ×3 IMPLANT
TAPE CLOTH SURG 4X10 WHT LF (GAUZE/BANDAGES/DRESSINGS) ×3 IMPLANT
TOWEL GREEN STERILE (TOWEL DISPOSABLE) ×3 IMPLANT
TOWEL GREEN STERILE FF (TOWEL DISPOSABLE) ×3 IMPLANT
WATER STERILE IRR 1000ML POUR (IV SOLUTION) ×3 IMPLANT

## 2016-09-12 NOTE — Anesthesia Preprocedure Evaluation (Addendum)
Anesthesia Evaluation  Patient identified by MRN, date of birth, ID band Patient awake    Reviewed: Allergy & Precautions, NPO status , Patient's Chart, lab work & pertinent test results  History of Anesthesia Complications (+) PONV and history of anesthetic complications  Airway Mallampati: II  TM Distance: >3 FB Neck ROM: Full    Dental no notable dental hx. (+) Dental Advisory Given   Pulmonary neg pulmonary ROS,    Pulmonary exam normal        Cardiovascular hypertension, Normal cardiovascular exam     Neuro/Psych negative psych ROS   GI/Hepatic negative GI ROS, Neg liver ROS,   Endo/Other  diabetes  Renal/GU negative Renal ROS  negative genitourinary   Musculoskeletal negative musculoskeletal ROS (+)   Abdominal   Peds negative pediatric ROS (+)  Hematology negative hematology ROS (+)   Anesthesia Other Findings   Reproductive/Obstetrics negative OB ROS                             Anesthesia Physical Anesthesia Plan  ASA: III  Anesthesia Plan: General   Post-op Pain Management:    Induction: Intravenous  PONV Risk Score and Plan: 3 and Ondansetron, Dexamethasone, Diphenhydramine and Treatment may vary due to age or medical condition  Airway Management Planned: Oral ETT  Additional Equipment:   Intra-op Plan:   Post-operative Plan: Extubation in OR  Informed Consent: I have reviewed the patients History and Physical, chart, labs and discussed the procedure including the risks, benefits and alternatives for the proposed anesthesia with the patient or authorized representative who has indicated his/her understanding and acceptance.   Dental advisory given  Plan Discussed with: CRNA, Anesthesiologist and Surgeon  Anesthesia Plan Comments:         Anesthesia Quick Evaluation

## 2016-09-12 NOTE — Op Note (Signed)
Brief history: The patient is a 71 year old white male with a history of metastatic prostate cancer. He has known bony metastasis. He has developed intractable low back pain with pain into his right lower extremity consistent with a radiculopathy. He was worked up with a lumbar MRI which demonstrated a S1/S2 metastasis with compression of the right S1 and S2 nerve roots. I discussed the situation with the patient and his wife. We discussed the various treatment options include surgery. He has weighed the risks, benefits, and alternatives surgery decided proceed with a right S1-S2 laminectomy for removal of spinal tumor.  Preoperative diagnosis: Right S1-S2 metastasis, right sacral radiculopathy  Postoperative diagnosis: The same  Procedure: Right S1-S2 laminectomy for resection of tumor to decompress the right S1 and S2 nerve roots using micro-dissection  Surgeon: Dr. Earle Gell  Asst.: None  Anesthesia: Gen. endotracheal  Estimated blood loss: 75 mL  Drains: None  Complications: None  Description of procedure: The patient was brought to the operating room by the anesthesia team. General endotracheal anesthesia was induced. The patient was turned to the prone position on the Wilson frame. The patient's lumbosacral region was then prepared with Betadine scrub and Betadine solution. Sterile drapes were applied.  I then injected the area to be incised with Marcaine with epinephrine solution. I then used a scalpel to make a linear midline incision over the S1-S2 intervertebral space. I then used electrocautery to perform a right sided subperiosteal dissection exposing the spinous process and lamina of S1 and S2. We obtained intraoperative radiograph to confirm our location. I then inserted the [facility] retractor for exposure.  We then brought the operative microscope into the field. Under its magnification and illumination we completed the microdissection. I used a high-speed drill to perform  a laminotomy at S1-S2 on the right. I then used a Kerrison punches to widen the laminotomy. We then used microdissection to free up the thecal sac and the right S1 and S2 nerve root from the epidural tissue. I then used a Kerrison punch to perform a foraminotomy at about the right S1 and S2 nerve root. I then used microdissection to dissecting between the S1 and S2 nerve roots and encountered a tumor. I freed up the tumor from the nerve roots using microdissection and removed with the pituitary forceps and Kerrison punches. I inspected the sacral vertebral body. I didn't see any obvious tumor.  I then palpated along the ventral surface of the thecal sac and along exit route of the right S1 and S2 nerve root and noted that the neural structures were well decompressed. This completed the decompression.  We then obtained hemostasis using bipolar electrocautery. We irrigated the wound out with bacitracin solution. We then removed the retractor. We then reapproximated the patient's thoracolumbar fascia with interrupted #1 Vicryl suture. We then reapproximated the patient's subcutaneous tissue with interrupted 2-0 Vicryl suture. We then reapproximated patient's skin with Steri-Strips and benzoin. The was then coated with bacitracin ointment. The drapes were removed. The patient was subsequently returned to the supine position where they were extubated by the anesthesia team. The patient was then transported to the postanesthesia care unit in stable condition. All sponge instrument and needle counts were reportedly correct at the end of this case.

## 2016-09-12 NOTE — Transfer of Care (Signed)
Immediate Anesthesia Transfer of Care Note  Patient: [name]  Procedure(s) Performed: Procedure(s): LAMINECTOMY AND FORAMINOTOMY RIGHT LUMBAR FIVE- SACRAL ONE, SACRAL ONE- SACRAL TWO DECOMPRESSION OF RIGHT SACRAL ONE NERVE ROOT (Right)  Patient Location: PACU  Anesthesia Type:General  Level of Consciousness: sedated  Airway & Oxygen Therapy: Patient Spontanous Breathing and Patient connected to nasal cannula oxygen  Post-op Assessment: Report given to RN, Post -op Vital signs reviewed and stable and Patient moving all extremities  Post vital signs: Reviewed and stable  Last Vitals:  Vitals:   09/12/16 1136  BP: (!) 188/99  Pulse: 88  Resp: 18  Temp: 36.7 C  SpO2: 100%    Last Pain:  Vitals:   09/12/16 1223  TempSrc:   PainSc: 4       Patients Stated Pain Goal: 2 (02/40/97 3532)  Complications: No apparent anesthesia complications

## 2016-09-12 NOTE — H&P (Signed)
Subjective: The patient is a 71 year old white male with a history of metastatic prostate cancer who has developed severe right buttock and leg pain consistent with a right S1 or S2 radiculopathy. He has failed medical management and was worked up with a lumbar MRI which demonstrated a sacral lesion with compression of the nerve roots consistent with a prostate metastasis. I discussed the situation with the patient and his wife. We discussed the various treatment options. He has decided to proceed with surgery after weighing the risks, benefits, and alternatives.   Past Medical History:  Diagnosis Date  . Complication of anesthesia   . Constipation    due to pain medication  . 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 Dyspnea    with exertion  . Essential hypertension 07/12/2014  . High blood sugar   . PONV (postoperative nausea and vomiting)   . Prostate cancer ([place]) 07/23/2011   with mets to sacral, lungs  . Radiation 04/22/14-05/06/14   right sacrum 30 gray    Past Surgical History:  Procedure Laterality Date  . CIRCUMCISION     when patient was 70 years old  . COLONOSCOPY    . COLONOSCOPY W/ POLYPECTOMY    . ESOPHAGOGASTRODUODENOSCOPY    . HAND SURGERY Left    fracture  . ROBOT ASSISTED LAPAROSCOPIC RADICAL PROSTATECTOMY    . TONSILECTOMY/ADENOIDECTOMY WITH MYRINGOTOMY      Allergies  Allergen Reactions  . Codeine Nausea And Vomiting  . Hydrocodone Nausea And Vomiting    Social History  Substance Use Topics  . Smoking status: Never Smoker  . Smokeless tobacco: Never Used     Comment: never used tobacco  . Alcohol use No    Family History  Problem Relation Age of Onset  . Prostate cancer Father   . Heart disease Father   . Heart disease Mother    Prior to Admission medications   Medication Sig Start Date End Date Taking? Authorizing Provider  acetaminophen (TYLENOL) 500 MG tablet Take 1,000 mg by mouth every 4 (four) hours as needed for  moderate pain.   Yes [provider]  aspirin 81 MG tablet Take 81 mg by mouth daily.   Yes [provider]  Calcium Carbonate-Vitamin D (CALCIUM + D PO) Take 770 mg by mouth 2 (two) times daily.    Yes [provider]  CALCIUM-IRON-VIT D-VIT K PO Take 1 tablet by mouth 2 (two) times daily.   Yes [provider]  gabapentin (NEURONTIN) 300 MG capsule TAKE 1 CAPSULE BY MOUTH TWICE A DAY THEN TAKE 2 CAPSULES AT BEDTIME 04/12/16  Yes Ennever, Rudell Cobb, MD  HYDROmorphone (DILAUDID) 4 MG tablet Take 1 tablet (4 mg total) by mouth every 6 (six) hours as needed for severe pain. 08/17/16  Yes [name], MD  isosorbide mononitrate (IMDUR) 30 MG 24 hr tablet Take 30 mg by mouth daily.   Yes [provider]  lactulose (CHRONULAC) 10 GM/15ML solution Take 45 mLs (30 g total) by mouth 3 (three) times daily. 09/10/16  Yes [name], MD  lidocaine (XYLOCAINE) 2 % solution  08/31/16  Yes [provider]  magic mouthwash w/lidocaine SOLN Take 5 mLs by mouth 4 (four) times daily as needed for mouth pain. 08/31/16  Yes [name], MD  metFORMIN (GLUCOPHAGE-XR) 500 MG 24 hr tablet TAKE TWO TABLETS BY MOUTH DAILY WITH BREAKFAST 08/03/16  Yes Ennever, Rudell Cobb, MD  mupirocin ointment (BACTROBAN) 2 %  09/07/16  Yes [provider]  ondansetron (ZOFRAN ODT) 4 MG disintegrating tablet Take 1 tablet (4 mg total) by mouth every 8 (eight) hours as needed for nausea or vomiting. 08/17/16  Yes [name], MD  OVER THE COUNTER MEDICATION every morning. Juice Plus -- 8 capsule of each the garden, vineyard, and orchead   Yes [provider]  predniSONE (DELTASONE) 5 MG tablet TAKE 1 TABLET BY MOUTH DAILY 08/03/16  Yes Ennever, Rudell Cobb, MD  prochlorperazine (COMPAZINE) 10 MG tablet Take 1 tablet (10 mg total) by mouth every 6 (six) hours as needed for nausea or vomiting. 09/22/15  Yes [name], MD  traMADol (ULTRAM) 50 MG tablet Take 1 tablet (50  mg total) by mouth every 6 (six) hours as needed. 08/17/16  Yes [name], MD  UNABLE TO FIND Med Name: Juice Plus Omega Blend   Yes [provider]  UNABLE TO FIND Med Name: Juice Plus [id number], Garden Blend, [place]   Yes [provider]  VESICARE 5 MG tablet TAKE 1 TABLET BY MOUTH DAILY. Patient taking differently: TAKE 1 TABLET BY MOUTH TWICE A WEEK 07/16/16  Yes Ennever, Rudell Cobb, MD  ZETIA 10 MG tablet Take 10 mg by mouth daily.  06/29/14  Yes [provider]  naloxegol oxalate (MOVANTIK) 12.5 MG TABS tablet Take 1 tablet (12.5 mg total) by mouth daily. 09/10/16   [name], MD     Review of Systems  Positive ROS: As above  All other systems have been reviewed and were otherwise negative with the exception of those mentioned in the HPI and as above.  Objective: Vital signs in last 24 hours: Temp:  [98.1 F (36.7 C)] 98.1 F (36.7 C) (08/29 1136) Pulse Rate:  [88] 88 (08/29 1136) Resp:  [18] 18 (08/29 1136) BP: (188)/(99) 188/99 (08/29 1136) SpO2:  [100 %] 100 % (08/29 1136) Weight:  [66.7 kg (147 lb)] 66.7 kg (147 lb) (08/29 1136)  General Appearance: Alert Head: Normocephalic, without obvious abnormality, atraumatic Eyes: PERRL, conjunctiva/corneas clear, EOM's intact,    Ears: Normal  Throat: Normal  Neck: Supple, Back: unremarkable, straight leg raise testing is positive on the right. Lungs: Clear to auscultation bilaterally, respirations unlabored Heart: Regular rate and rhythm, no murmur, rub or gallop Abdomen: Soft, non-tender Extremities: Extremities normal, atraumatic, no cyanosis or edema Skin: unremarkable  NEUROLOGIC:   Mental status: alert and oriented,Motor Exam - grossly normal except some weakness in his right gastrocnemius. Sensory Exam - grossly normal except for right S1/S2 numbness. Reflexes: Unremarkable except his right ankle jerk reflex is absent Coordination - grossly normal Gait - grossly  normal Balance - grossly normal Cranial Nerves: I: smell Not tested  II: visual acuity  OS: Normal  OD: Normal   II: visual fields Full to confrontation  II: pupils Equal, round, reactive to light  III,VII: ptosis None  III,IV,VI: extraocular muscles  Full ROM  V: mastication Normal  V: facial light touch sensation  Normal  V,VII: corneal reflex  Present  VII: facial muscle function - upper  Normal  VII: facial muscle function - lower Normal  VIII: hearing Not tested  IX: soft palate elevation  Normal  IX,X: gag reflex Present  XI: trapezius strength  5/5  XI: sternocleidomastoid strength 5/5  XI: neck flexion strength  5/5  XII: tongue strength  Normal    Data Review Lab Results  Component Value Date   WBC 7.8 09/10/2016   HGB  13.6 09/10/2016   HCT 41.3 09/10/2016   MCV 95 09/10/2016   PLT 322 09/10/2016   Lab Results  Component Value Date   NA 141 09/10/2016   K 3.6 09/10/2016   CL 99 09/10/2016   CO2 31 09/10/2016   BUN 13 09/10/2016   CREATININE 1.3 (H) 09/10/2016   GLUCOSE 142 (H) 09/10/2016   No results found for: INR, PROTIME  Assessment/Plan: Right sacral lesion, right lumbosacral radiculopathy: I discussed the situation with the patient and his wife and reviewed his MRI scan with him. We have discussed various treatment options including surgery. I have described the surgical treatment option of a right sacral laminectomy with decompression of the right S1 and S2 nerve roots. I have shown them surgical models I have given them surgical pamphlet. We have discussed the risks, benefits, alternatives, expected postoperative course, and likelihood of achieving our goals with surgery. I have answered all their questions. He has decided to proceed with surgery.   [name] D 09/12/2016 12:46 PM

## 2016-09-12 NOTE — Anesthesia Postprocedure Evaluation (Signed)
Anesthesia Post Note  Patient: TRAMAINE SAULS  Procedure(s) Performed: Procedure(s) (LRB): LAMINECTOMY AND FORAMINOTOMY RIGHT LUMBAR FIVE- SACRAL ONE, SACRAL ONE- SACRAL TWO DECOMPRESSION OF RIGHT SACRAL ONE NERVE ROOT (Right)     Patient location during evaluation: PACU Anesthesia Type: General Level of consciousness: sedated Pain management: pain level controlled Vital Signs Assessment: post-procedure vital signs reviewed and stable Respiratory status: spontaneous breathing and respiratory function stable Cardiovascular status: stable Anesthetic complications: no    Last Vitals:  Vitals:   09/12/16 1615 09/12/16 1630  BP: (!) 165/88 (!) 168/94  Pulse: 95 91  Resp: 15 13  Temp:    SpO2: 100% 100%    Last Pain:  Vitals:   09/12/16 1223  TempSrc:   PainSc: 4                  Shantelle Alles DANIEL

## 2016-09-12 NOTE — Progress Notes (Signed)
Patient complaining of 6/10 pain and requesting something for pain. Notified Dr. Tobias Alexander and received verbal order for 50-100 mcg of Fentanyl.  Dr. Tobias Alexander also made aware of patient blood pressure.   Will continue to monitor.

## 2016-09-12 NOTE — Anesthesia Procedure Notes (Signed)
Procedure Name: Intubation Date/Time: 09/12/2016 1:27 PM Performed by: Everlean Cherry A Pre-anesthesia Checklist: Patient identified, Emergency Drugs available, Suction available and Patient being monitored Patient Re-evaluated:Patient Re-evaluated prior to induction Oxygen Delivery Method: Circle system utilized Preoxygenation: Pre-oxygenation with 100% oxygen Induction Type: IV induction Ventilation: Mask ventilation without difficulty and Oral airway inserted - appropriate to patient size Laryngoscope Size: 2 and Miller Grade View: Grade II Tube type: Oral Tube size: 8.0 mm Number of attempts: 2 Airway Equipment and Method: Stylet Placement Confirmation: ETT inserted through vocal cords under direct vision,  positive ETCO2 and breath sounds checked- equal and bilateral Secured at: 23 cm Tube secured with: Tape Dental Injury: Teeth and Oropharynx as per pre-operative assessment  Difficulty Due To: Difficulty was anticipated and Difficult Airway- due to anterior larynx Comments: DL x1 with Mil 2 by CRNA.  Unable to visualize beyond epiglottis.  DL x2 with Mil 2 by Dr. Tobias Alexander.  Blanket removed from behind head.  Grade 2 view.  EBBS and VSS.

## 2016-09-13 ENCOUNTER — Encounter (HOSPITAL_COMMUNITY): Payer: Self-pay | Admitting: Neurosurgery

## 2016-09-13 DIAGNOSIS — C7989 Secondary malignant neoplasm of other specified sites: Secondary | ICD-10-CM | POA: Diagnosis not present

## 2016-09-13 LAB — GLUCOSE, CAPILLARY: Glucose-Capillary: 118 mg/dL — ABNORMAL HIGH (ref 65–99)

## 2016-09-13 MED ORDER — DOCUSATE SODIUM 100 MG PO CAPS: 100.0000 mg | ORAL_CAPSULE | Freq: Two times a day (BID) | ORAL | 0 refills | Status: DC

## 2016-09-13 MED ORDER — CYCLOBENZAPRINE HCL 10 MG PO TABS: 10.0000 mg | ORAL_TABLET | Freq: Three times a day (TID) | ORAL | 0 refills | Status: DC | PRN

## 2016-09-13 MED ORDER — HYDROMORPHONE HCL 4 MG PO TABS: 4.0000 mg | ORAL_TABLET | ORAL | 0 refills | Status: DC | PRN

## 2016-09-13 MED FILL — DOK 100 MG SOFTGEL: 100 | 50 days supply | Qty: 100 | Fill #0

## 2016-09-13 MED FILL — HYDROmorphone HCL 4 MG TABS: 4 | 5 days supply | Qty: 30 | Fill #0

## 2016-09-13 MED FILL — CYCLOBENZAPRINE 10 MG TAB: 10 | 17 days supply | Qty: 50 | Fill #0

## 2016-09-13 NOTE — Discharge Summary (Signed)
Physician Discharge Summary  Patient ID: [name] MRN: [id number] DOB/AGE: 1945/06/13 71 y.o.  Admit date: 09/12/2016 Discharge date: 09/13/2016  Admission Diagnoses:Right S1-S2 spinal tumor, right sacral radiculopathy, lumbago  Discharge Diagnoses: The same Active Problems:   Metastasis to bone [facility])   Discharged Condition: good  Hospital Course: I performed a right S1-S2 laminectomy for resection of a spinal tumor and decompression of the right S1 and S2 nerve roots on 09/12/2016. The surgery went well. Specimen sent to pathology for identification.  The patient's postoperative course was unremarkable. On postoperative day #1 the patient requested discharge home. His leg pain is gone. The patient, and his wife, were given written and oral discharge instructions. All their questions were answered.  Consults: Physical therapy Significant Diagnostic Studies: Tumor specimen was sent to pathology Treatments: Right S1-S2 laminectomy for resection of spinal tumor and decompression of the right S1 and S2 nerve roots using microdissection. Discharge Exam: Blood pressure 137/82, pulse 92, temperature 98.8 F (37.1 C), resp. rate 16, height 5\' 8"  (1.727 m), weight 66.7 kg (147 lb), SpO2 99 %. The patient is alert and pleasant. He looks well. He has some slight weakness in his right gastrocnemius and dorsiflexors without change.  Disposition: Home  Discharge Instructions    Call MD for:  difficulty breathing, headache or visual disturbances    Complete by:  As directed    Call MD for:  extreme fatigue    Complete by:  As directed    Call MD for:  hives    Complete by:  As directed    Call MD for:  persistant dizziness or light-headedness    Complete by:  As directed    Call MD for:  persistant nausea and vomiting    Complete by:  As directed    Call MD for:  redness, tenderness, or signs of infection (pain, swelling, redness, odor or green/yellow discharge around incision site)     Complete by:  As directed    Call MD for:  severe uncontrolled pain    Complete by:  As directed    Call MD for:  temperature >100.4    Complete by:  As directed    Diet - low sodium heart healthy    Complete by:  As directed    Discharge instructions    Complete by:  As directed    Call [phone] for a followup appointment. Take a stool softener while you are using pain medications.   Driving Restrictions    Complete by:  As directed    Do not drive for 2 weeks.   Increase activity slowly    Complete by:  As directed    Lifting restrictions    Complete by:  As directed    Do not lift more than 5 pounds. No excessive bending or twisting.   May shower / Bathe    Complete by:  As directed    He may shower after the pain she is removed 3 days after surgery. Leave the incision alone.   Remove dressing in 48 hours    Complete by:  As directed    Your stitches are under the scan and will dissolve by themselves. The Steri-Strips will fall off after you take a few showers. Do not rub back or pick at the wound, Leave the wound alone.     Allergies as of 09/13/2016      Reactions   Codeine Nausea And Vomiting   Hydrocodone Nausea And Vomiting  Medication List    STOP taking these medications   mupirocin ointment 2 % Commonly known as:  BACTROBAN     TAKE these medications   acetaminophen 500 MG tablet Commonly known as:  TYLENOL Take 1,000 mg by mouth every 4 (four) hours as needed for moderate pain.   aspirin 81 MG tablet Take 81 mg by mouth daily.   CALCIUM + D PO Take 770 mg by mouth 2 (two) times daily.   CALCIUM-IRON-VIT D-VIT K PO Take 1 tablet by mouth 2 (two) times daily.   cyclobenzaprine 10 MG tablet Commonly known as:  FLEXERIL Take 1 tablet (10 mg total) by mouth 3 (three) times daily as needed for muscle spasms.   docusate sodium 100 MG capsule Commonly known as:  COLACE Take 1 capsule (100 mg total) by mouth 2 (two) times daily.   gabapentin  300 MG capsule Commonly known as:  NEURONTIN TAKE 1 CAPSULE BY MOUTH TWICE A DAY THEN TAKE 2 CAPSULES AT BEDTIME   HYDROmorphone 4 MG tablet Commonly known as:  DILAUDID Take 1 tablet (4 mg total) by mouth every 6 (six) hours as needed for severe pain. What changed:  Another medication with the same name was added. Make sure you understand how and when to take each.   HYDROmorphone 4 MG tablet Commonly known as:  DILAUDID Take 1 tablet (4 mg total) by mouth every 4 (four) hours as needed for severe pain. What changed:  You were already taking a medication with the same name, and this prescription was added. Make sure you understand how and when to take each.   isosorbide mononitrate 30 MG 24 hr tablet Commonly known as:  IMDUR Take 30 mg by mouth daily.   lactulose 10 GM/15ML solution Commonly known as:  CHRONULAC Take 45 mLs (30 g total) by mouth 3 (three) times daily.   lidocaine 2 % solution Commonly known as:  XYLOCAINE   magic mouthwash w/lidocaine Soln Take 5 mLs by mouth 4 (four) times daily as needed for mouth pain.   metFORMIN 500 MG 24 hr tablet Commonly known as:  GLUCOPHAGE-XR TAKE TWO TABLETS BY MOUTH DAILY WITH BREAKFAST   naloxegol oxalate 12.5 MG Tabs tablet Commonly known as:  MOVANTIK Take 1 tablet (12.5 mg total) by mouth daily.   ondansetron 4 MG disintegrating tablet Commonly known as:  ZOFRAN ODT Take 1 tablet (4 mg total) by mouth every 8 (eight) hours as needed for nausea or vomiting.   OVER THE COUNTER MEDICATION every morning. Juice Plus -- 8 capsule of each the garden, vineyard, and orchead   predniSONE 5 MG tablet Commonly known as:  DELTASONE TAKE 1 TABLET BY MOUTH DAILY   prochlorperazine 10 MG tablet Commonly known as:  COMPAZINE Take 1 tablet (10 mg total) by mouth every 6 (six) hours as needed for nausea or vomiting.   traMADol 50 MG tablet Commonly known as:  ULTRAM Take 1 tablet (50 mg total) by mouth every 6 (six) hours as  needed.   UNABLE TO FIND Med Name: Juice Plus Omega Blend   UNABLE TO FIND Med Name: Juice Plus [employer], Garden Blend, Orchard Blend   VESICARE 5 MG tablet Generic drug:  solifenacin TAKE 1 TABLET BY MOUTH DAILY. What changed:  See the new instructions.   ZETIA 10 MG tablet Generic drug:  ezetimibe Take 10 mg by mouth daily.            Discharge Care Instructions  Start     Ordered   09/13/16 0000  cyclobenzaprine (FLEXERIL) 10 MG tablet  3 times daily PRN     09/13/16 1043   09/13/16 0000  docusate sodium (COLACE) 100 MG capsule  2 times daily     09/13/16 1043   09/13/16 0000  HYDROmorphone (DILAUDID) 4 MG tablet  Every 4 hours PRN     09/13/16 1043   09/13/16 0000  Discharge instructions    Comments:  Call [phone] for a followup appointment. Take a stool softener while you are using pain medications.   09/13/16 1043   09/13/16 0000  Increase activity slowly     09/13/16 1043   09/13/16 0000  May shower / Bathe    Comments:  He may shower after the pain she is removed 3 days after surgery. Leave the incision alone.   09/13/16 1043   09/13/16 0000  Driving Restrictions    Comments:  Do not drive for 2 weeks.   09/13/16 1043   09/13/16 0000  Lifting restrictions    Comments:  Do not lift more than 5 pounds. No excessive bending or twisting.   09/13/16 1043   09/13/16 0000  Diet - low sodium heart healthy     09/13/16 1043   09/13/16 0000  Remove dressing in 48 hours    Comments:  Your stitches are under the scan and will dissolve by themselves. The Steri-Strips will fall off after you take a few showers. Do not rub back or pick at the wound, Leave the wound alone.   09/13/16 1043   09/13/16 0000  Call MD for:  temperature >100.4     09/13/16 1043   09/13/16 0000  Call MD for:  persistant nausea and vomiting     09/13/16 1043   09/13/16 0000  Call MD for:  severe uncontrolled pain     09/13/16 1043   09/13/16 0000  Call MD for:  redness,  tenderness, or signs of infection (pain, swelling, redness, odor or green/yellow discharge around incision site)     09/13/16 1043   09/13/16 0000  Call MD for:  difficulty breathing, headache or visual disturbances     09/13/16 1043   09/13/16 0000  Call MD for:  hives     09/13/16 1043   09/13/16 0000  Call MD for:  persistant dizziness or light-headedness     09/13/16 1043   09/13/16 0000  Call MD for:  extreme fatigue     09/13/16 1043       Signed: [name] D 09/13/2016, 10:43 AM

## 2016-09-13 NOTE — Progress Notes (Signed)
Patient alert and oriented, mae's well, voiding adequate amount of urine, swallowing without difficulty, no c/o pain at time of discharge. Patient discharged home with family. Script and discharged instructions given to patient. Patient and family stated understanding of instructions given. Patient has an appointment with Dr. Jenkins   

## 2016-09-13 NOTE — Evaluation (Signed)
Physical Therapy Evaluation Patient Details Name: [name] MRN: [id number] DOB: 02-22-1945 Today's Date: 09/13/2016   History of Present Illness  Pt is a 71 y/o male s/p R S1-S2 laminectomy with ressection of tumor. 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  Clinical Impression  Pt presented sitting EOB with spouse present, awake and willing to participate in therapy session. Prior to admission, pt reported that he ambulated with use of either [facility] or rollator and required assistance with LB dressing/bathing. Pt ambulated in hallway with RW and min guard for safety. Pt also successfully completed stair training with min guard for safety. Pt would benefit from further PT services in the Outpatient setting to address his below listed deficits. No further acute PT needs identified at this time. PT signing off.    Follow Up Recommendations Outpatient PT;Supervision/Assistance - 24 hour    Equipment Recommendations  None recommended by PT    Recommendations for Other Services       Precautions / Restrictions Precautions Precautions: Fall;Back Precaution Booklet Issued: Yes (comment) Precaution Comments: PT reviewed 3/3 back precautions with pt and pt's spouse Restrictions Weight Bearing Restrictions: No      Mobility  Bed Mobility               General bed mobility comments: pt sitting EOB upon arrival  Transfers Overall transfer level: Needs assistance Equipment used: Rolling walker (2 wheeled) Transfers: Sit to/from Stand Sit to Stand: Min guard         General transfer comment: increased time, despite cueing pt pulling up on RW with bilateral hands  Ambulation/Gait Ambulation/Gait assistance: Min guard Ambulation Distance (Feet): 200 Feet Assistive device: Rolling walker (2 wheeled) Gait Pattern/deviations: Step-through pattern;Decreased step length - right;Decreased step length - left;Decreased stride  length Gait velocity: decreased Gait velocity interpretation: Below normal speed for age/gender General Gait Details: mild instability but no overt LOB or need for physical assistance, min guard for safety  Stairs Stairs: Yes Stairs assistance: Min guard Stair Management: Two rails;Step to pattern;Forwards Number of Stairs: 8 General stair comments: min guard for safety  Wheelchair Mobility    Modified Rankin (Stroke Patients Only)       Balance Overall balance assessment: Needs assistance Sitting-balance support: Feet supported Sitting balance-Leahy Scale: Good     Standing balance support: During functional activity;No upper extremity supported Standing balance-Leahy Scale: Fair                               Pertinent Vitals/Pain Pain Assessment: Faces Faces Pain Scale: Hurts little more Pain Location: back Pain Descriptors / Indicators: Sore Pain Intervention(s): Monitored during session;Repositioned    Home Living Family/patient expects to be discharged to:: Private residence Living Arrangements: Spouse/significant other;Parent Available Help at Discharge: Family;Available 24 hours/day Type of Home: House Home Access: Stairs to enter Entrance Stairs-Rails: None Entrance Stairs-Number of Steps: 2 Home Layout: One level Home Equipment: Walker - 2 wheels;Walker - 4 wheels;Shower seat;Cane - single point      Prior Function Level of Independence: Needs assistance   Gait / Transfers Assistance Needed: pt reported that he either ambulated with use of SPC or rollator  ADL's / Homemaking Assistance Needed: pt reported that he required assistance with LB ADLs        Hand Dominance        Extremity/Trunk Assessment   Upper Extremity Assessment Upper Extremity  Assessment: Overall WFL for tasks assessed    Lower Extremity Assessment Lower Extremity Assessment: Generalized weakness    Cervical / Trunk Assessment Cervical / Trunk Assessment:  Other exceptions Cervical / Trunk Exceptions: s/p spinal sx  Communication   Communication: No difficulties  Cognition Arousal/Alertness: Awake/alert Behavior During Therapy: WFL for tasks assessed/performed Overall Cognitive Status: Within Functional Limits for tasks assessed                                        General Comments      Exercises     Assessment/Plan    PT Assessment All further PT needs can be met in the next venue of care  PT Problem List Decreased strength;Decreased balance;Decreased mobility;Decreased coordination;Decreased safety awareness;Decreased knowledge of precautions;Pain       PT Treatment Interventions      PT Goals (Current goals can be found in the Care Plan section)  Acute Rehab PT Goals Patient Stated Goal: return home and go to OP PT to strengthen R LE    Frequency     Barriers to discharge        Co-evaluation               AM-PAC PT "6 Clicks" Daily Activity  Outcome Measure Difficulty turning over in bed (including adjusting bedclothes, sheets and blankets)?: A Little Difficulty moving from lying on back to sitting on the side of the bed? : A Little Difficulty sitting down on and standing up from a chair with arms (e.g., wheelchair, bedside commode, etc,.)?: Unable Help needed moving to and from a bed to chair (including a wheelchair)?: None Help needed walking in hospital room?: A Little Help needed climbing 3-5 steps with a railing? : A Little 6 Click Score: 17    End of Session Equipment Utilized During Treatment: Gait belt Activity Tolerance: Patient tolerated treatment well Patient left: with call bell/phone within reach;with family/visitor present;Other (comment) (sitting EOB) Nurse Communication: Mobility status PT Visit Diagnosis: Other abnormalities of gait and mobility (R26.89);Pain Pain - part of body:  (back)    Time: 2355-7322 PT Time Calculation (min) (ACUTE ONLY): 24 min   Charges:    PT Evaluation $PT Eval Moderate Complexity: 1 Mod PT Treatments $Gait Training: 8-22 mins   PT G Codes:   PT G-Codes **NOT FOR INPATIENT CLASS** Functional Assessment Tool Used: AM-PAC 6 Clicks Basic Mobility;Clinical judgement Functional Limitation: Mobility: Walking and moving around Mobility: Walking and Moving Around Current Status (G2542): At least 40 percent but less than 60 percent impaired, limited or restricted Mobility: Walking and Moving Around Goal Status [phone]): At least 1 percent but less than 20 percent impaired, limited or restricted Mobility: Walking and Moving Around Discharge Status [phone]): At least 40 percent but less than 60 percent impaired, limited or restricted    [facility], [place], DPT [place] 09/13/2016, 11:00 AM

## 2016-10-04 ENCOUNTER — Other Ambulatory Visit: Payer: Self-pay | Admitting: *Deleted

## 2016-10-04 ENCOUNTER — Ambulatory Visit (HOSPITAL_COMMUNITY)
Admission: RE | Admit: 2016-10-04 | Discharge: 2016-10-04 | Disposition: A | Discharge status: Home / Self Care | Payer: Medicare Other | Source: Ambulatory Visit | Attending: Hematology & Oncology | Admitting: Hematology & Oncology

## 2016-10-04 ENCOUNTER — Telehealth: Payer: Self-pay | Admitting: *Deleted

## 2016-10-04 DIAGNOSIS — C61 Malignant neoplasm of prostate: Secondary | ICD-10-CM | POA: Insufficient documentation

## 2016-10-04 DIAGNOSIS — C7951 Secondary malignant neoplasm of bone: Secondary | ICD-10-CM

## 2016-10-04 DIAGNOSIS — R609 Edema, unspecified: Secondary | ICD-10-CM | POA: Insufficient documentation

## 2016-10-04 DIAGNOSIS — M25551 Pain in right hip: Secondary | ICD-10-CM | POA: Diagnosis not present

## 2016-10-04 MED FILL — predniSONE 5 MG TABS: 5 | 60 days supply | Qty: 60 | Fill #1

## 2016-10-04 MED FILL — GABAPENTIN 300 MG CAPSULE: 300 | 30 days supply | Qty: 120 | Fill #3

## 2016-10-04 MED FILL — traMADol HCL 50 MG TABS: 50 | 22 days supply | Qty: 90 | Fill #1

## 2016-10-04 NOTE — Telephone Encounter (Signed)
Patient c/o pain to his RIGHT leg. He states it feels like his leg is broken at the hip and calf. He is about 3 weeks out from surgery and hasn't contacted his surgeon. He states he's on vacation this week. His f/u appointment is scheduled for Tuesday, September 25th. He is alternating his ultram and tylenol. Has not been using his dilaudid.   Reviewed with Dr Marin Olp. He would like patient to have an MRI of the right hip.  Orders placed. Scan scheduled. Patient aware of appointment time and location.

## 2016-10-05 ENCOUNTER — Telehealth: Payer: Self-pay | Admitting: *Deleted

## 2016-10-05 NOTE — Telephone Encounter (Signed)
[facility] Cancer Type ID sent off through [facility].  Paperwork faxed . [phone].

## 2016-10-09 ENCOUNTER — Ambulatory Visit (HOSPITAL_COMMUNITY)
Admission: RE | Admit: 2016-10-09 | Discharge: 2016-10-09 | Disposition: A | Discharge status: Home / Self Care | Payer: Medicare Other | Source: Ambulatory Visit | Attending: Vascular Surgery | Admitting: Vascular Surgery

## 2016-10-09 ENCOUNTER — Other Ambulatory Visit (HOSPITAL_COMMUNITY): Payer: Self-pay | Admitting: Neurosurgery

## 2016-10-09 DIAGNOSIS — M79661 Pain in right lower leg: Secondary | ICD-10-CM

## 2016-10-09 DIAGNOSIS — M7989 Other specified soft tissue disorders: Principal | ICD-10-CM

## 2016-10-12 ENCOUNTER — Other Ambulatory Visit (HOSPITAL_BASED_OUTPATIENT_CLINIC_OR_DEPARTMENT_OTHER): Payer: Medicare Other

## 2016-10-12 ENCOUNTER — Ambulatory Visit (HOSPITAL_BASED_OUTPATIENT_CLINIC_OR_DEPARTMENT_OTHER): Payer: Medicare Other | Admitting: Hematology & Oncology

## 2016-10-12 ENCOUNTER — Ambulatory Visit: Payer: Medicare Other

## 2016-10-12 VITALS — BP 140/79 | HR 106 | Temp 98.1°F | Resp 17 | Wt 143.0 lb

## 2016-10-12 DIAGNOSIS — C7951 Secondary malignant neoplasm of bone: Secondary | ICD-10-CM

## 2016-10-12 DIAGNOSIS — R64 Cachexia: Secondary | ICD-10-CM

## 2016-10-12 DIAGNOSIS — C61 Malignant neoplasm of prostate: Secondary | ICD-10-CM

## 2016-10-12 DIAGNOSIS — T402X5A Adverse effect of other opioids, initial encounter: Secondary | ICD-10-CM | POA: Diagnosis not present

## 2016-10-12 DIAGNOSIS — K5903 Drug induced constipation: Secondary | ICD-10-CM

## 2016-10-12 LAB — CBC WITH DIFFERENTIAL (CANCER CENTER ONLY)
BASO#: 0 10*3/uL (ref 0.0–0.2)
BASO%: 0.4 % (ref 0.0–2.0)
EOS%: 3 % (ref 0.0–7.0)
Eosinophils Absolute: 0.2 10*3/uL (ref 0.0–0.5)
HCT: 39.2 % (ref 38.7–49.9)
HGB: 13 g/dL (ref 13.0–17.1)
LYMPH#: 1.1 10*3/uL (ref 0.9–3.3)
LYMPH%: 14.2 % (ref 14.0–48.0)
MCH: 31.5 pg (ref 28.0–33.4)
MCHC: 33.2 g/dL (ref 32.0–35.9)
MCV: 95 fL (ref 82–98)
MONO#: 0.6 10*3/uL (ref 0.1–0.9)
MONO%: 8.2 % (ref 0.0–13.0)
NEUT#: 5.5 10*3/uL (ref 1.5–6.5)
NEUT%: 74.2 % (ref 40.0–80.0)
Platelets: 251 10*3/uL (ref 145–400)
RBC: 4.13 10*6/uL — ABNORMAL LOW (ref 4.20–5.70)
RDW: 13.8 % (ref 11.1–15.7)
WBC: 7.5 10*3/uL (ref 4.0–10.0)

## 2016-10-12 LAB — CMP (CANCER CENTER ONLY)
ALT(SGPT): 15 U/L (ref 10–47)
AST: 20 U/L (ref 11–38)
Albumin: 3.6 g/dL (ref 3.3–5.5)
Alkaline Phosphatase: 67 U/L (ref 26–84)
BUN, Bld: 14 mg/dL (ref 7–22)
CO2: 31 mEq/L (ref 18–33)
Calcium: 10.3 mg/dL (ref 8.0–10.3)
Chloride: 92 mEq/L — ABNORMAL LOW (ref 98–108)
Creat: 1 mg/dl (ref 0.6–1.2)
Glucose, Bld: 148 mg/dL — ABNORMAL HIGH (ref 73–118)
Potassium: 4.6 mEq/L (ref 3.3–4.7)
Sodium: 138 mEq/L (ref 128–145)
Total Bilirubin: 0.6 mg/dl (ref 0.20–1.60)
Total Protein: 7 g/dL (ref 6.4–8.1)

## 2016-10-12 MED ORDER — DRONABINOL 2.5 MG PO CAPS: 2.5000 mg | ORAL_CAPSULE | Freq: Two times a day (BID) | ORAL | 0 refills | Status: DC

## 2016-10-12 MED ORDER — MEGESTROL ACETATE 20 MG PO TABS: 20.0000 mg | ORAL_TABLET | Freq: Every day | ORAL | 3 refills | Status: DC

## 2016-10-12 MED FILL — MEGESTROL 20 MG TABLET: 20 | 30 days supply | Qty: 30 | Fill #0

## 2016-10-12 NOTE — Progress Notes (Signed)
Hematology and Oncology Follow Up Visit  [name] [id number] 03/01/1945 71 y.o. 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 is back for followup. He comes in with his wife and sister. He has a rolling walker. He had his back surgery successfully on August 29. However, the path report ([id number]) show metastatic carcinoma. The pathologist does not feel that this is prostate cancer.  I suppose this would make sense since his PSA has not been elevated. However, he's been treated for metastatic prostate cancer for the past 3 or 4 years and has really not had any issues.  I did send off the specimen for genetic profiling. Hopefully, this will show [place] what might be going on. It is possible that this prostate cancer may have "devolved" and this is not staining for the appropriate markers.  He does have some weakness in the right leg. He is going to undergo physical therapy.  He does have a lot of hot flashes.  He is on Neurontin. I will try him on some Megace (20 mg by mouth daily) to see if this helps.  His appetite is still not that great. hurts. I'm going to try him on some Marinol. Hopefully, this may also help with some pain.  Again, I'm not sure exactly what this pathology will show. There should be enough material for genetic analysis.  His blood sugars have been doing pretty well.  He has had no cardiac issues.   He has had some nausea but no vomiting.   Currently, his performance status is ECOG 2.    Medications:  Current Outpatient  Prescriptions:  .  acetaminophen (TYLENOL) 500 MG tablet, Take 1,000 mg by mouth every 4 (four) hours as needed for moderate pain., Disp: , Rfl:  .  aspirin 81 MG tablet, Take 81 mg by mouth daily., Disp: , Rfl:  .  Calcium Carbonate-Vitamin D (CALCIUM + D PO), Take 770 mg by mouth 2 (two) times daily. , Disp: , Rfl:  .  CALCIUM-IRON-VIT D-VIT K PO, Take 1 tablet by mouth 2 (two) times daily., Disp: , Rfl:  .  cyclobenzaprine (FLEXERIL) 10 MG tablet, Take 1 tablet (10 mg total) by mouth 3 (three) times daily as needed for muscle spasms., Disp: 50 tablet, Rfl: 0 .  dronabinol (MARINOL) 2.5 MG capsule, Take 1 capsule (2.5 mg total) by mouth 2 (two) times daily before lunch and supper., Disp: 60 capsule, Rfl: 0 .  gabapentin (NEURONTIN) 300 MG capsule, TAKE 1 CAPSULE BY MOUTH TWICE A DAY THEN TAKE 2 CAPSULES AT BEDTIME, Disp: 120 capsule, Rfl: 4 .  isosorbide mononitrate (IMDUR) 30 MG 24 hr tablet, Take 30 mg by mouth daily., Disp: , Rfl:  .  lactulose (CHRONULAC) 10 GM/15ML solution, Take 45 mLs (30 g total) by mouth 3 (three) times daily., Disp: 1892 mL, Rfl: 2 .  lactulose, encephalopathy, (CHRONULAC) 10 GM/15ML SOLN, , Disp: , Rfl: 2 .  lidocaine (XYLOCAINE)  2 % solution, , Disp: , Rfl:  .  magic mouthwash w/lidocaine SOLN, Take 5 mLs by mouth 4 (four) times daily as needed for mouth pain., Disp: 500 mL, Rfl: 6 .  megestrol (MEGACE) 20 MG tablet, Take 1 tablet (20 mg total) by mouth daily., Disp: 30 tablet, Rfl: 3 .  metFORMIN (GLUCOPHAGE-XR) 500 MG 24 hr tablet, TAKE TWO TABLETS BY MOUTH DAILY WITH BREAKFAST, Disp: 180 tablet, Rfl: 2 .  ondansetron (ZOFRAN ODT) 4 MG disintegrating tablet, Take 1 tablet (4 mg total) by mouth every 8 (eight) hours as needed for nausea or vomiting., Disp: 30 tablet, Rfl: 2 .  OVER THE COUNTER MEDICATION, every morning. Juice Plus -- 8 capsule of each the garden, vineyard, and orchead, Disp: , Rfl:  .  predniSONE (DELTASONE) 5 MG tablet, TAKE 1 TABLET BY MOUTH DAILY,  Disp: 60 tablet, Rfl: 3 .  prochlorperazine (COMPAZINE) 10 MG tablet, Take 1 tablet (10 mg total) by mouth every 6 (six) hours as needed for nausea or vomiting., Disp: 30 tablet, Rfl: 0 .  traMADol (ULTRAM) 50 MG tablet, Take 1 tablet (50 mg total) by mouth every 6 (six) hours as needed., Disp: 90 tablet, Rfl: 2 .  UNABLE TO FIND, Med Name: Juice Plus Omega Blend, Disp: , Rfl:  .  UNABLE TO FIND, Med Name: Juice Plus [employer], Garden Blend, [employer], Disp: , Rfl:  .  VESICARE 5 MG tablet, TAKE 1 TABLET BY MOUTH DAILY. (Patient taking differently: TAKE 1 TABLET BY MOUTH TWICE A WEEK), Disp: 30 tablet, Rfl: 4 .  ZETIA 10 MG tablet, Take 10 mg by mouth daily. , Disp: , Rfl:   Allergies:  Allergies  Allergen Reactions  . Codeine Nausea And Vomiting  . Hydrocodone Nausea And Vomiting    Past Medical History, Surgical history, Social history, and Family History were reviewed and updated.  Review of Systems: As stated in the interim history  Physical Exam:  weight is 143 lb (64.9 kg). His oral temperature is 98.1 F (36.7 C). His blood pressure is 140/79 and his pulse is 106 (abnormal). His respiration is 17 and oxygen saturation is 100%.   Well-developed well-nourished white male. Head and neck exam shows no ocular or oral lesions. There are no palpable cervical or supraclavicular lymph nodes. Lungs are clear. Cardiac exam regular rate and rhythm with no murmurs, rubs or bruits. Abdomen is soft. He has good bowel sounds. He may be slightly obese. There is no fluid wave. There is no palpable liver or spleen tip. Extremities show some weakness on the right side. It is better than when we saw him prior to his surgery. He has no swelling in the joints. Has no edema in his lower legs. There may be some slight nonpitting edema of the right leg. No venous cord is noted. Skin exam shows no rashes, ecchymoses or petechia. Neurological exam shows no focal neurological deficits outside of that  associated with the right leg..    Lab Results  Component Value Date   WBC 7.5 10/12/2016   HGB 13.0 10/12/2016   HCT 39.2 10/12/2016   MCV 95 10/12/2016   PLT 251 10/12/2016     Chemistry      Component Value Date/Time   NA 138 10/12/2016 1026   NA 141 10/14/2015 0803   K 4.6 10/12/2016 1026   K 4.2 10/14/2015 0803   CL 92 (L) 10/12/2016 1026   CO2 31 10/12/2016 1026   CO2 27 10/14/2015 0803  BUN 14 10/12/2016 1026   BUN 17.9 10/14/2015 0803   CREATININE 1.0 10/12/2016 1026   CREATININE 1.2 10/14/2015 0803      Component Value Date/Time   CALCIUM 10.3 10/12/2016 1026   CALCIUM 10.0 10/14/2015 0803   ALKPHOS 67 10/12/2016 1026   ALKPHOS 78 10/14/2015 0803   AST 20 10/12/2016 1026   AST 17 10/14/2015 0803   ALT 15 10/12/2016 1026   ALT 12 10/14/2015 0803   BILITOT 0.60 10/12/2016 1026   BILITOT 0.45 10/14/2015 0803         Impression and Plan: Mr. Shevlin is 71 year old gentleman with metastatic prostate cancer.   I am incredibly perplexed by the pathology report. I just cannot imagine that he has another kind of malignancy. However, this might explain why his PSA has always been normal.  I will like to hope that I will get the report back from genetic profiling in another week.  I talked to he and his wife/sister for about 45 minutes. I gave him the pathology report. I went over my interpretation of the pathology report.  If this is a different kind of malignancy, we definitely will need to do some additional staging studies. He is not a smoker. He has no GI symptoms. I cannot imagine what other malignancy he might have.  Again, this is incredibly complicated. It always takes a good 45 minutes to sort out what is going on with Mr. Donatelli. He is doing a great job. He has great support from his family. He has done everything we have asked him to do.  I want to see him back in another 2 or 3 weeks. Once we get that report back from genetic profiling, we will call  him.     [name], MD 9/28/201811:42 AM

## 2016-10-13 LAB — PSA: Prostate Specific Ag, Serum: <0.1 ng/mL (ref 0.0–4.0)

## 2016-10-15 DIAGNOSIS — M5417 Radiculopathy, lumbosacral region: Secondary | ICD-10-CM | POA: Diagnosis not present

## 2016-10-15 DIAGNOSIS — M545 Low back pain: Secondary | ICD-10-CM | POA: Diagnosis not present

## 2016-10-15 DIAGNOSIS — M6281 Muscle weakness (generalized): Secondary | ICD-10-CM | POA: Diagnosis not present

## 2016-10-15 DIAGNOSIS — M4710 Other spondylosis with myelopathy, site unspecified: Secondary | ICD-10-CM | POA: Diagnosis not present

## 2016-10-15 MED FILL — DRONABINOL 2.5 MG CAP: 2.5 | 30 days supply | Qty: 60 | Fill #0

## 2016-10-17 DIAGNOSIS — C61 Malignant neoplasm of prostate: Secondary | ICD-10-CM | POA: Diagnosis not present

## 2016-10-17 DIAGNOSIS — M4710 Other spondylosis with myelopathy, site unspecified: Secondary | ICD-10-CM | POA: Diagnosis not present

## 2016-10-17 DIAGNOSIS — M5417 Radiculopathy, lumbosacral region: Secondary | ICD-10-CM | POA: Diagnosis not present

## 2016-10-17 DIAGNOSIS — C7951 Secondary malignant neoplasm of bone: Secondary | ICD-10-CM | POA: Diagnosis not present

## 2016-10-17 DIAGNOSIS — M6281 Muscle weakness (generalized): Secondary | ICD-10-CM | POA: Diagnosis not present

## 2016-10-17 DIAGNOSIS — M545 Low back pain: Secondary | ICD-10-CM | POA: Diagnosis not present

## 2016-10-18 ENCOUNTER — Other Ambulatory Visit: Payer: Self-pay | Admitting: Hematology & Oncology

## 2016-10-18 DIAGNOSIS — C7951 Secondary malignant neoplasm of bone: Secondary | ICD-10-CM | POA: Diagnosis not present

## 2016-10-18 DIAGNOSIS — C61 Malignant neoplasm of prostate: Secondary | ICD-10-CM | POA: Diagnosis not present

## 2016-10-18 DIAGNOSIS — M4710 Other spondylosis with myelopathy, site unspecified: Secondary | ICD-10-CM | POA: Diagnosis not present

## 2016-10-18 DIAGNOSIS — M5417 Radiculopathy, lumbosacral region: Secondary | ICD-10-CM | POA: Diagnosis not present

## 2016-10-18 DIAGNOSIS — C7A1 Malignant poorly differentiated neuroendocrine tumors: Secondary | ICD-10-CM

## 2016-10-18 DIAGNOSIS — M6281 Muscle weakness (generalized): Secondary | ICD-10-CM | POA: Diagnosis not present

## 2016-10-18 DIAGNOSIS — M545 Low back pain: Secondary | ICD-10-CM | POA: Diagnosis not present

## 2016-10-19 DIAGNOSIS — Z6821 Body mass index (BMI) 21.0-21.9, adult: Secondary | ICD-10-CM | POA: Diagnosis not present

## 2016-10-19 DIAGNOSIS — E785 Hyperlipidemia, unspecified: Secondary | ICD-10-CM | POA: Diagnosis not present

## 2016-10-19 DIAGNOSIS — E119 Type 2 diabetes mellitus without complications: Secondary | ICD-10-CM | POA: Diagnosis not present

## 2016-10-22 ENCOUNTER — Ambulatory Visit (HOSPITAL_BASED_OUTPATIENT_CLINIC_OR_DEPARTMENT_OTHER): Payer: Medicare Other | Admitting: Hematology & Oncology

## 2016-10-22 ENCOUNTER — Other Ambulatory Visit (HOSPITAL_BASED_OUTPATIENT_CLINIC_OR_DEPARTMENT_OTHER): Payer: Medicare Other

## 2016-10-22 ENCOUNTER — Encounter (HOSPITAL_COMMUNITY): Payer: Self-pay

## 2016-10-22 VITALS — BP 121/71 | HR 93 | Temp 97.8°F | Wt 142.8 lb

## 2016-10-22 DIAGNOSIS — R64 Cachexia: Secondary | ICD-10-CM | POA: Diagnosis not present

## 2016-10-22 DIAGNOSIS — C7951 Secondary malignant neoplasm of bone: Secondary | ICD-10-CM | POA: Diagnosis not present

## 2016-10-22 DIAGNOSIS — C801 Malignant (primary) neoplasm, unspecified: Secondary | ICD-10-CM | POA: Diagnosis not present

## 2016-10-22 DIAGNOSIS — M545 Low back pain: Secondary | ICD-10-CM | POA: Diagnosis not present

## 2016-10-22 DIAGNOSIS — M4710 Other spondylosis with myelopathy, site unspecified: Secondary | ICD-10-CM | POA: Diagnosis not present

## 2016-10-22 DIAGNOSIS — M5417 Radiculopathy, lumbosacral region: Secondary | ICD-10-CM | POA: Diagnosis not present

## 2016-10-22 DIAGNOSIS — C61 Malignant neoplasm of prostate: Secondary | ICD-10-CM

## 2016-10-22 DIAGNOSIS — C7A1 Malignant poorly differentiated neuroendocrine tumors: Secondary | ICD-10-CM | POA: Diagnosis not present

## 2016-10-22 DIAGNOSIS — M6281 Muscle weakness (generalized): Secondary | ICD-10-CM | POA: Diagnosis not present

## 2016-10-22 LAB — COMPREHENSIVE METABOLIC PANEL
ALT: 14 U/L (ref 0–55)
AST: 16 U/L (ref 5–34)
Albumin: 3.9 g/dL (ref 3.5–5.0)
Alkaline Phosphatase: 60 U/L (ref 40–150)
Anion Gap: 10 mEq/L (ref 3–11)
BUN: 14 mg/dL (ref 7.0–26.0)
CO2: 27 mEq/L (ref 22–29)
Calcium: 10.4 mg/dL (ref 8.4–10.4)
Chloride: 102 mEq/L (ref 98–109)
Creatinine: 1.1 mg/dL (ref 0.7–1.3)
EGFR: 67 mL/min/{1.73_m2} — ABNORMAL LOW (ref >90)
Glucose: 94 mg/dl (ref 70–140)
Potassium: 4.3 mEq/L (ref 3.5–5.1)
Sodium: 140 mEq/L (ref 136–145)
Total Bilirubin: 0.31 mg/dL (ref 0.20–1.20)
Total Protein: 7.2 g/dL (ref 6.4–8.3)

## 2016-10-22 LAB — CBC WITH DIFFERENTIAL (CANCER CENTER ONLY)
BASO#: 0 10*3/uL (ref 0.0–0.2)
BASO%: 0.3 % (ref 0.0–2.0)
EOS%: 2.4 % (ref 0.0–7.0)
Eosinophils Absolute: 0.2 10*3/uL (ref 0.0–0.5)
HCT: 39.4 % (ref 38.7–49.9)
HGB: 13 g/dL (ref 13.0–17.1)
LYMPH#: 1.2 10*3/uL (ref 0.9–3.3)
LYMPH%: 15.6 % (ref 14.0–48.0)
MCH: 31.3 pg (ref 28.0–33.4)
MCHC: 33 g/dL (ref 32.0–35.9)
MCV: 95 fL (ref 82–98)
MONO#: 0.6 10*3/uL (ref 0.1–0.9)
MONO%: 8 % (ref 0.0–13.0)
NEUT#: 5.8 10*3/uL (ref 1.5–6.5)
NEUT%: 73.7 % (ref 40.0–80.0)
Platelets: 346 10*3/uL (ref 145–400)
RBC: 4.16 10*6/uL — ABNORMAL LOW (ref 4.20–5.70)
RDW: 14.1 % (ref 11.1–15.7)
WBC: 7.8 10*3/uL (ref 4.0–10.0)

## 2016-10-22 LAB — CEA (IN HOUSE-CHCC): CEA (CHCC-In House): 42.65 ng/mL — ABNORMAL HIGH (ref 0.00–5.00)

## 2016-10-22 MED FILL — ISOSORBIDE MN ER 30 MG TAB: 30 | 90 days supply | Qty: 90 | Fill #2

## 2016-10-22 MED FILL — traMADol HCL 50 MG TABS: 50 | 22 days supply | Qty: 90 | Fill #2

## 2016-10-22 NOTE — Progress Notes (Signed)
Hematology and Oncology Follow Up Visit  [name] [id number] May 27, 1945 71 y.o. 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  [name] is back for followup. He comes in with his wife and sister. He has a rolling walker. He had his back surgery successfully on August 29. However, the path report ([id number]) show metastatic carcinoma. The pathologist does not feel that this is prostate cancer.  I sent the pathologic specimen off to [facility] for genetic array. 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 They felt this was likely of lung origin.  I would be incredibly surprised if this was lung cancer as he has never smoked and we have not had any obvious lung abnormalities on his scans.  I talked to he and his family for about 50 minutes. I explained to them what we are looking at. Typically, neuroendocrine cancers require chemotherapy.  He has already had radiation therapy to the spine. I'm not sure if he can any further radiation to the spine.  I want to have him set up with a nuclear medicine scan. I will have to call radiology to see which nuclear medicine the scan would be appropriate.  I'm sending off chromogranin A and neuron specific enolase test to see if these are elevated.  He is doing better with physical therapy. He has his right foot in a sling so that it will not cause some foot drop.  His appetite is good. He's had no nausea or vomiting.  There's been no issues with bowels or bladder.  Overall, his performance status is ECOG 1.      Medications:  Current Outpatient Prescriptions:  .  acetaminophen (TYLENOL) 500 MG tablet, Take 1,000 mg by mouth every 4 (four) hours as needed for moderate pain., Disp: , Rfl:  .  aspirin 81 MG tablet, Take 81 mg by mouth daily., Disp: , Rfl:  .  Calcium Carbonate-Vitamin D (CALCIUM + D PO), Take 770 mg by mouth 2 (two) times daily. , Disp: , Rfl:  .  CALCIUM-IRON-VIT D-VIT K PO, Take 1 tablet by mouth 2 (two) times daily., Disp: , Rfl:  .  cyclobenzaprine (FLEXERIL) 10 MG tablet, Take 1 tablet (10 mg total) by mouth 3 (three) times daily as needed for muscle spasms., Disp: 50 tablet, Rfl: 0 .  dronabinol (MARINOL) 2.5 MG capsule, Take 1 capsule (2.5 mg total) by mouth 2 (two) times daily before lunch and supper., Disp: 60 capsule, Rfl: 0 .  gabapentin (NEURONTIN) 300 MG capsule, TAKE 1 CAPSULE BY MOUTH TWICE A DAY THEN TAKE 2 CAPSULES AT BEDTIME, Disp: 120 capsule, Rfl: 4 .  HYDROmorphone (DILAUDID) 4 MG tablet, Take by mouth every 4 (four) hours as needed for severe pain., Disp: , Rfl:  .  isosorbide mononitrate (IMDUR) 30 MG 24 hr tablet, Take 30 mg by mouth daily., Disp: , Rfl:  .  lactulose, encephalopathy, (CHRONULAC) 10 GM/15ML SOLN, daily as needed. , Disp: , Rfl: 2 .  lidocaine (XYLOCAINE) 2 % solution, , Disp: ,  Rfl:  .  magic mouthwash w/lidocaine SOLN, Take 5 mLs by mouth 4 (four) times daily as needed for mouth pain., Disp: 500 mL, Rfl: 6 .  megestrol (MEGACE) 20 MG tablet, Take 1 tablet (20 mg total) by mouth daily., Disp: 30 tablet, Rfl: 3 .  metFORMIN (GLUCOPHAGE-XR) 500 MG 24 hr tablet, TAKE TWO TABLETS BY MOUTH DAILY WITH BREAKFAST, Disp: 180 tablet, Rfl: 2 .  ondansetron (ZOFRAN ODT) 4 MG disintegrating tablet, Take 1 tablet (4 mg total) by mouth every 8 (eight) hours as needed for nausea or vomiting., Disp: 30 tablet, Rfl: 2 .  OVER THE COUNTER MEDICATION, every morning. Juice Plus -- 8 capsule of each the garden, vineyard, and orchead, Disp: , Rfl:  .  predniSONE  (DELTASONE) 5 MG tablet, TAKE 1 TABLET BY MOUTH DAILY, Disp: 60 tablet, Rfl: 3 .  prochlorperazine (COMPAZINE) 10 MG tablet, Take 1 tablet (10 mg total) by mouth every 6 (six) hours as needed for nausea or vomiting., Disp: 30 tablet, Rfl: 0 .  traMADol (ULTRAM) 50 MG tablet, Take 1 tablet (50 mg total) by mouth every 6 (six) hours as needed., Disp: 90 tablet, Rfl: 2 .  UNABLE TO FIND, Med Name: Juice Plus Omega Blend, Disp: , Rfl:  .  UNABLE TO FIND, Med Name: Juice Plus [employer], Garden Blend, [employer], Disp: , Rfl:  .  VESICARE 5 MG tablet, TAKE 1 TABLET BY MOUTH DAILY. (Patient taking differently: TAKE 1 TABLET BY MOUTH TWICE A WEEK), Disp: 30 tablet, Rfl: 4 .  ZETIA 10 MG tablet, Take 10 mg by mouth daily. , Disp: , Rfl:   Allergies:  Allergies  Allergen Reactions  . Codeine Nausea And Vomiting  . Hydrocodone Nausea And Vomiting    Past Medical History, Surgical history, Social history, and Family History were reviewed and updated.  Review of Systems: As stated in the interim history  Physical Exam:  weight is 142 lb 12.8 oz (64.8 kg). His oral temperature is 97.8 F (36.6 C). His blood pressure is 121/71 and his pulse is 93.   Well-developed well-nourished white male. Head and neck exam shows no ocular or oral lesions. There are no palpable cervical or supraclavicular lymph nodes. Lungs are clear. Cardiac exam regular rate and rhythm with no murmurs, rubs or bruits. Abdomen is soft. He has good bowel sounds. He may be slightly obese. There is no fluid wave. There is no palpable liver or spleen tip. Extremities show a compression stocking on his right leg and a improvised sliding for his right foot. There is still some some weakness on the right leg. He does have better ability to leg lift on the right side.. It is better than when we saw him prior to his surgery. He has no swelling in the joints. Has no edema in his lower legs. There may be some slight nonpitting edema of the  right leg. No venous cord is noted. Skin exam shows no rashes, ecchymoses or petechia. Neurological exam shows no focal neurological deficits outside of that associated with the right leg..    Lab Results  Component Value Date   WBC 7.8 10/22/2016   HGB 13.0 10/22/2016   HCT 39.4 10/22/2016   MCV 95 10/22/2016   PLT 346 10/22/2016     Chemistry      Component Value Date/Time   NA 138 10/12/2016 1026   NA 141 10/14/2015 0803   K 4.6 10/12/2016 1026   K 4.2 10/14/2015 0803  CL 92 (L) 10/12/2016 1026   CO2 31 10/12/2016 1026   CO2 27 10/14/2015 0803   BUN 14 10/12/2016 1026   BUN 17.9 10/14/2015 0803   CREATININE 1.0 10/12/2016 1026   CREATININE 1.2 10/14/2015 0803      Component Value Date/Time   CALCIUM 10.3 10/12/2016 1026   CALCIUM 10.0 10/14/2015 0803   ALKPHOS 67 10/12/2016 1026   ALKPHOS 78 10/14/2015 0803   AST 20 10/12/2016 1026   AST 17 10/14/2015 0803   ALT 15 10/12/2016 1026   ALT 12 10/14/2015 0803   BILITOT 0.60 10/12/2016 1026   BILITOT 0.45 10/14/2015 0803         Impression and Plan: Mr. Burdin is 71 year old gentleman with metastatic prostate cancer.   Having this tumor be neuroendocrine definitely makes sense. This would explain why his PSA was always low and that he still had evidence of progressive disease.  I spoke to Dr. Thayer Jew, who is a prostate cancer expert at [facility]. He would like to see Mr. Seckman. I will go ahead and get some staging studies on him initially.  Dr. Iona Beard says there might be a couple clinical trials that Mr. Bungert might qualify for. I think this would be wonderful if this could happen.  I told Mr. Sipos is family that if he is going to qualify for a clinical trial, he needs to be off chemotherapy for at least 6 weeks. It is now been close to 6 weeks that he has not had treatment.   I will see him back in 3 weeks.      [name], MD 10/8/201812:32 PM

## 2016-10-23 DIAGNOSIS — M6281 Muscle weakness (generalized): Secondary | ICD-10-CM | POA: Diagnosis not present

## 2016-10-23 DIAGNOSIS — M545 Low back pain: Secondary | ICD-10-CM | POA: Diagnosis not present

## 2016-10-23 DIAGNOSIS — M5417 Radiculopathy, lumbosacral region: Secondary | ICD-10-CM | POA: Diagnosis not present

## 2016-10-23 DIAGNOSIS — M4710 Other spondylosis with myelopathy, site unspecified: Secondary | ICD-10-CM | POA: Diagnosis not present

## 2016-10-23 LAB — CANCER ANTIGEN 19-9: CA 19-9: 77 U/mL — ABNORMAL HIGH (ref 0–35)

## 2016-10-24 LAB — CHROMOGRANIN A: Chromogranin A: 2 nmol/L (ref 0–5)

## 2016-10-24 LAB — NEURON-SPECIFIC ENOLASE(NSE), BLOOD: Neuron-specific Enolase, Serum: 5.6 ng/mL (ref 0.0–12.5)

## 2016-10-25 ENCOUNTER — Other Ambulatory Visit: Payer: Self-pay | Admitting: Hematology & Oncology

## 2016-10-25 ENCOUNTER — Encounter: Payer: Self-pay | Admitting: Hematology & Oncology

## 2016-10-25 DIAGNOSIS — C7B8 Other secondary neuroendocrine tumors: Secondary | ICD-10-CM

## 2016-10-25 DIAGNOSIS — M545 Low back pain: Secondary | ICD-10-CM | POA: Diagnosis not present

## 2016-10-25 DIAGNOSIS — M6281 Muscle weakness (generalized): Secondary | ICD-10-CM | POA: Diagnosis not present

## 2016-10-25 DIAGNOSIS — M4710 Other spondylosis with myelopathy, site unspecified: Secondary | ICD-10-CM | POA: Diagnosis not present

## 2016-10-25 DIAGNOSIS — M5417 Radiculopathy, lumbosacral region: Secondary | ICD-10-CM | POA: Diagnosis not present

## 2016-10-30 ENCOUNTER — Encounter: Payer: Self-pay | Admitting: Hematology & Oncology

## 2016-10-30 DIAGNOSIS — M6281 Muscle weakness (generalized): Secondary | ICD-10-CM | POA: Diagnosis not present

## 2016-10-30 DIAGNOSIS — M5417 Radiculopathy, lumbosacral region: Secondary | ICD-10-CM | POA: Diagnosis not present

## 2016-10-30 DIAGNOSIS — M545 Low back pain: Secondary | ICD-10-CM | POA: Diagnosis not present

## 2016-10-30 DIAGNOSIS — M4710 Other spondylosis with myelopathy, site unspecified: Secondary | ICD-10-CM | POA: Diagnosis not present

## 2016-10-31 DIAGNOSIS — M4710 Other spondylosis with myelopathy, site unspecified: Secondary | ICD-10-CM | POA: Diagnosis not present

## 2016-10-31 DIAGNOSIS — M6281 Muscle weakness (generalized): Secondary | ICD-10-CM | POA: Diagnosis not present

## 2016-10-31 DIAGNOSIS — M5417 Radiculopathy, lumbosacral region: Secondary | ICD-10-CM | POA: Diagnosis not present

## 2016-10-31 DIAGNOSIS — M545 Low back pain: Secondary | ICD-10-CM | POA: Diagnosis not present

## 2016-11-01 DIAGNOSIS — M545 Low back pain: Secondary | ICD-10-CM | POA: Diagnosis not present

## 2016-11-01 DIAGNOSIS — M6281 Muscle weakness (generalized): Secondary | ICD-10-CM | POA: Diagnosis not present

## 2016-11-01 DIAGNOSIS — M4710 Other spondylosis with myelopathy, site unspecified: Secondary | ICD-10-CM | POA: Diagnosis not present

## 2016-11-01 DIAGNOSIS — M5417 Radiculopathy, lumbosacral region: Secondary | ICD-10-CM | POA: Diagnosis not present

## 2016-11-02 MED FILL — GABAPENTIN 300 MG CAPSULE: 300 | 30 days supply | Qty: 120 | Fill #4

## 2016-11-05 ENCOUNTER — Encounter: Payer: Self-pay | Admitting: Hematology & Oncology

## 2016-11-05 DIAGNOSIS — M6281 Muscle weakness (generalized): Secondary | ICD-10-CM | POA: Diagnosis not present

## 2016-11-05 DIAGNOSIS — M545 Low back pain: Secondary | ICD-10-CM | POA: Diagnosis not present

## 2016-11-05 DIAGNOSIS — M4710 Other spondylosis with myelopathy, site unspecified: Secondary | ICD-10-CM | POA: Diagnosis not present

## 2016-11-05 DIAGNOSIS — M5417 Radiculopathy, lumbosacral region: Secondary | ICD-10-CM | POA: Diagnosis not present

## 2016-11-05 MED FILL — MEGESTROL 20 MG TABLET: 20 | 30 days supply | Qty: 30 | Fill #1

## 2016-11-06 ENCOUNTER — Encounter: Payer: Self-pay | Admitting: Hematology & Oncology

## 2016-11-06 ENCOUNTER — Encounter (HOSPITAL_COMMUNITY)
Admission: RE | Admit: 2016-11-06 | Discharge: 2016-11-06 | Disposition: A | Discharge status: Home / Self Care | Payer: Medicare Other | Source: Ambulatory Visit | Attending: Hematology & Oncology | Admitting: Hematology & Oncology

## 2016-11-06 DIAGNOSIS — C7B8 Other secondary neuroendocrine tumors: Secondary | ICD-10-CM | POA: Diagnosis not present

## 2016-11-06 DIAGNOSIS — R918 Other nonspecific abnormal finding of lung field: Secondary | ICD-10-CM | POA: Diagnosis not present

## 2016-11-06 MED ORDER — GALLIUM GA 68 DOTATATE IV KIT
5.7600 | PACK | Freq: Once | INTRAVENOUS | Status: AC
Start: 2016-11-06 — End: 2016-11-06
  Administered 2016-11-06: 5.76 via INTRAVENOUS

## 2016-11-08 DIAGNOSIS — M545 Low back pain: Secondary | ICD-10-CM | POA: Diagnosis not present

## 2016-11-08 DIAGNOSIS — M5417 Radiculopathy, lumbosacral region: Secondary | ICD-10-CM | POA: Diagnosis not present

## 2016-11-08 DIAGNOSIS — M4710 Other spondylosis with myelopathy, site unspecified: Secondary | ICD-10-CM | POA: Diagnosis not present

## 2016-11-08 DIAGNOSIS — M6281 Muscle weakness (generalized): Secondary | ICD-10-CM | POA: Diagnosis not present

## 2016-11-12 ENCOUNTER — Other Ambulatory Visit (HOSPITAL_BASED_OUTPATIENT_CLINIC_OR_DEPARTMENT_OTHER): Payer: Medicare Other

## 2016-11-12 ENCOUNTER — Ambulatory Visit (HOSPITAL_BASED_OUTPATIENT_CLINIC_OR_DEPARTMENT_OTHER): Payer: Medicare Other

## 2016-11-12 ENCOUNTER — Ambulatory Visit (HOSPITAL_BASED_OUTPATIENT_CLINIC_OR_DEPARTMENT_OTHER): Payer: Medicare Other | Admitting: Hematology & Oncology

## 2016-11-12 VITALS — BP 93/52 | HR 87 | Temp 97.9°F | Resp 16 | Wt 140.0 lb

## 2016-11-12 DIAGNOSIS — C7951 Secondary malignant neoplasm of bone: Secondary | ICD-10-CM | POA: Diagnosis not present

## 2016-11-12 DIAGNOSIS — M545 Low back pain: Secondary | ICD-10-CM | POA: Diagnosis not present

## 2016-11-12 DIAGNOSIS — Z5111 Encounter for antineoplastic chemotherapy: Secondary | ICD-10-CM

## 2016-11-12 DIAGNOSIS — R64 Cachexia: Secondary | ICD-10-CM | POA: Diagnosis not present

## 2016-11-12 DIAGNOSIS — C61 Malignant neoplasm of prostate: Secondary | ICD-10-CM

## 2016-11-12 DIAGNOSIS — M6281 Muscle weakness (generalized): Secondary | ICD-10-CM | POA: Diagnosis not present

## 2016-11-12 DIAGNOSIS — M5417 Radiculopathy, lumbosacral region: Secondary | ICD-10-CM | POA: Diagnosis not present

## 2016-11-12 DIAGNOSIS — M4710 Other spondylosis with myelopathy, site unspecified: Secondary | ICD-10-CM | POA: Diagnosis not present

## 2016-11-12 LAB — CBC WITH DIFFERENTIAL (CANCER CENTER ONLY)
BASO#: 0 10*3/uL (ref 0.0–0.2)
BASO%: 0.1 % (ref 0.0–2.0)
EOS%: 3.4 % (ref 0.0–7.0)
Eosinophils Absolute: 0.3 10*3/uL (ref 0.0–0.5)
HCT: 38.3 % — ABNORMAL LOW (ref 38.7–49.9)
HGB: 12.7 g/dL — ABNORMAL LOW (ref 13.0–17.1)
LYMPH#: 1.1 10*3/uL (ref 0.9–3.3)
LYMPH%: 15.2 % (ref 14.0–48.0)
MCH: 31.2 pg (ref 28.0–33.4)
MCHC: 33.2 g/dL (ref 32.0–35.9)
MCV: 94 fL (ref 82–98)
MONO#: 0.7 10*3/uL (ref 0.1–0.9)
MONO%: 9.2 % (ref 0.0–13.0)
NEUT#: 5.4 10*3/uL (ref 1.5–6.5)
NEUT%: 72.1 % (ref 40.0–80.0)
Platelets: 294 10*3/uL (ref 145–400)
RBC: 4.07 10*6/uL — ABNORMAL LOW (ref 4.20–5.70)
RDW: 14.2 % (ref 11.1–15.7)
WBC: 7.4 10*3/uL (ref 4.0–10.0)

## 2016-11-12 LAB — CMP (CANCER CENTER ONLY)
ALT(SGPT): 26 U/L (ref 10–47)
AST: 21 U/L (ref 11–38)
Albumin: 3.5 g/dL (ref 3.3–5.5)
Alkaline Phosphatase: 57 U/L (ref 26–84)
BUN, Bld: 14 mg/dL (ref 7–22)
CO2: 30 mEq/L (ref 18–33)
Calcium: 10.4 mg/dL — ABNORMAL HIGH (ref 8.0–10.3)
Chloride: 102 mEq/L (ref 98–108)
Creat: 1.2 mg/dl (ref 0.6–1.2)
Glucose, Bld: 96 mg/dL (ref 73–118)
Potassium: 3.8 mEq/L (ref 3.3–4.7)
Sodium: 143 mEq/L (ref 128–145)
Total Bilirubin: 0.5 mg/dl (ref 0.20–1.60)
Total Protein: 7.3 g/dL (ref 6.4–8.1)

## 2016-11-12 MED ORDER — DENOSUMAB 120 MG/1.7ML ~~LOC~~ SOLN
120.0000 mg | Freq: Once | SUBCUTANEOUS | Status: AC
  Administered 2016-11-12: 120 mg via SUBCUTANEOUS

## 2016-11-12 MED ORDER — LEUPROLIDE ACETATE (3 MONTH) 22.5 MG IM KIT
22.5000 mg | PACK | Freq: Once | INTRAMUSCULAR | Status: AC
  Administered 2016-11-12: 22.5 mg via INTRAMUSCULAR
  Filled 2016-11-12: qty 22.5

## 2016-11-12 MED ORDER — DENOSUMAB 120 MG/1.7ML ~~LOC~~ SOLN
SUBCUTANEOUS | Status: AC
  Filled 2016-11-12: qty 1.7

## 2016-11-12 MED FILL — METFORMIN HCL ER 500 MG TAB: 500 | 90 days supply | Qty: 180 | Fill #1

## 2016-11-12 NOTE — Progress Notes (Signed)
Hematology and Oncology Follow Up Visit  KALIF KATTNER [id number] 02-13-1945 71 y.o. 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  Steck is back for followup. He comes in with his wife and sister. He has a rolling walker. He had his back surgery successfully on August 29. However, the path report ([id number]) show metastatic carcinoma. The pathologist does not feel that this is prostate cancer.  I sent the pathologic specimen off to [facility] for genetic array. 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 They felt this was likely of lung origin.  We did get the new PET scan using dotatate. This was done last week. I am very grateful to  radiology for getting his scan done so quickly. Surprisingly, the scan showed that he had a couple nodules that were increased in size. Mom was in the right azyago esophageal recess measuring 16 mm. Another was in the right lower lobe measuring 11 mm. There was a nodule in the left lingula. Surprisingly there were no bone lesions. No evidence of metastatic disease in the abdomen or pelvis. He has a stable sclerotic lesion in the right sacrum.  He is doing his physical therapy. He seems be getting a little bit better with this.  He is eating better.  He's had no bleeding. There is no problems with bowels or bladder. He's had no leg swelling.  Of note, his chromogranin A was normal. His neuron-specific enolase level was also normal.  Of interest, he has an elevated CA 19-9 of 77 and CEA of 43. I'm not sure exactly  what this indicates.  Currently, his overall performance status is ECOG 1-2.    Medications:  Current Outpatient Prescriptions:  .  acetaminophen (TYLENOL) 500 MG tablet, Take 1,000 mg by mouth every 4 (four) hours as needed for moderate pain., Disp: , Rfl:  .  aspirin 81 MG tablet, Take 81 mg by mouth daily., Disp: , Rfl:  .  Calcium Carbonate-Vitamin D (CALCIUM + D PO), Take 770 mg by mouth 2 (two) times daily. , Disp: , Rfl:  .  CALCIUM-IRON-VIT D-VIT K PO, Take 1 tablet by mouth 2 (two) times daily., Disp: , Rfl:  .  cyclobenzaprine (FLEXERIL) 10 MG tablet, Take 1 tablet (10 mg total) by mouth 3 (three) times daily as needed for muscle spasms., Disp: 50 tablet, Rfl: 0 .  dronabinol (MARINOL) 2.5 MG capsule, Take 1 capsule (2.5 mg total) by mouth 2 (two) times daily before lunch and supper., Disp: 60 capsule, Rfl: 0 .  gabapentin (NEURONTIN) 300 MG capsule, TAKE 1 CAPSULE BY MOUTH TWICE A DAY THEN TAKE 2 CAPSULES AT BEDTIME, Disp: 120 capsule, Rfl: 4 .  HYDROmorphone (DILAUDID) 4 MG tablet, Take by mouth every 4 (four) hours as needed for severe pain., Disp: , Rfl:  .  isosorbide mononitrate (IMDUR) 30 MG 24 hr tablet, Take 30 mg by mouth daily., Disp: , Rfl:  .  lactulose, encephalopathy, (CHRONULAC) 10 GM/15ML SOLN, daily as needed. , Disp: , Rfl: 2 .  magic mouthwash w/lidocaine SOLN, Take 5 mLs by mouth 4 (four) times daily as needed for mouth pain., Disp: 500 mL, Rfl: 6 .  megestrol (MEGACE) 20 MG tablet, Take 1 tablet (20 mg total) by mouth daily., Disp: 30 tablet, Rfl: 3 .  metFORMIN (GLUCOPHAGE-XR) 500 MG 24 hr tablet, TAKE TWO TABLETS BY MOUTH DAILY WITH BREAKFAST, Disp: 180 tablet, Rfl: 2 .  ondansetron (ZOFRAN ODT) 4 MG disintegrating tablet, Take 1 tablet (4 mg total) by mouth every 8 (eight) hours as needed for nausea or vomiting., Disp: 30 tablet, Rfl: 2 .  OVER THE COUNTER MEDICATION, every morning. Juice Plus -- 8 capsule of each the garden, vineyard, and orchead, Disp: , Rfl:    .  predniSONE (DELTASONE) 5 MG tablet, TAKE 1 TABLET BY MOUTH DAILY, Disp: 60 tablet, Rfl: 3 .  prochlorperazine (COMPAZINE) 10 MG tablet, Take 1 tablet (10 mg total) by mouth every 6 (six) hours as needed for nausea or vomiting., Disp: 30 tablet, Rfl: 0 .  traMADol (ULTRAM) 50 MG tablet, Take 1 tablet (50 mg total) by mouth every 6 (six) hours as needed., Disp: 90 tablet, Rfl: 2 .  UNABLE TO FIND, Med Name: Juice Plus Omega Blend, Disp: , Rfl:  .  UNABLE TO FIND, Med Name: Juice Plus [employer], [place] Blend, [employer], Disp: , Rfl:  .  VESICARE 5 MG tablet, TAKE 1 TABLET BY MOUTH DAILY. (Patient taking differently: TAKE 1 TABLET BY MOUTH TWICE A WEEK), Disp: 30 tablet, Rfl: 4 .  ZETIA 10 MG tablet, Take 10 mg by mouth daily. , Disp: , Rfl:   Allergies:  Allergies  Allergen Reactions  . Codeine Nausea And Vomiting  . Hydrocodone Nausea And Vomiting    Past Medical History, Surgical history, Social history, and Family History were reviewed and updated.  Review of Systems: As stated in the interim history  Physical Exam:  weight is 140 lb (63.5 kg). His oral temperature is 97.9 F (36.6 C). His blood pressure is 93/52 (abnormal) and his pulse is 87. His respiration is 16 and oxygen saturation is 100%.   Well-developed and well-nourished white male. Head and neck exam shows no ocular or oral lesions. There are no palpable cervical or supraclavicular lymph nodes. Lungs are clear bilaterally. Cardiac exam regular rate and rhythm with no murmurs, rubs or bruits. Abdomen is soft. He has good bowel sounds. There is no fluid wave. There is no palpable liver or spleen tip. Back exam shows no tenderness over the spine, ribs or hips. Extremities shows an improvised sling on the right lower leg to prevent a foot drop. He has good strength in his left leg. He has slightly better strength in the right leg.  Lab Results  Component Value Date   WBC 7.4 11/12/2016   HGB 12.7 (L) 11/12/2016    HCT 38.3 (L) 11/12/2016   MCV 94 11/12/2016   PLT 294 11/12/2016     Chemistry      Component Value Date/Time   NA 143 11/12/2016 1123   NA 140 10/22/2016 1129   K 3.8 11/12/2016 1123   K 4.3 10/22/2016 1129   CL 102 11/12/2016 1123   CO2 30 11/12/2016 1123   CO2 27 10/22/2016 1129   BUN 14 11/12/2016 1123   BUN 14.0 10/22/2016 1129   CREATININE 1.2 11/12/2016 1123   CREATININE 1.1 10/22/2016 1129      Component Value Date/Time   CALCIUM 10.4 (H) 11/12/2016 1123   CALCIUM 10.4  10/22/2016 1129   ALKPHOS 57 11/12/2016 1123   ALKPHOS 60 10/22/2016 1129   AST 21 11/12/2016 1123   AST 16 10/22/2016 1129   ALT 26 11/12/2016 1123   ALT 14 10/22/2016 1129   BILITOT 0.50 11/12/2016 1123   BILITOT 0.31 10/22/2016 1129         Impression and Plan: Mr. Jared is 71 year old gentleman with metastatic prostate cancer.   Having this tumor be neuroendocrine definitely makes sense. This would explain why his PSA was always low and that he still had evidence of progressive disease.  At this point, it looks like he has oligometastatic disease. I think that he would be a very good candidate for SRS. I spoke with Dr. Teryl Lucy of radiation oncology. He agrees. I think that given the fact that he just has these 2-3 spots does not warrant systemic chemotherapy with its side effects.  Mr. Uselman and his family agree.  I will still give him the Lupron. He will also get Xgeva today.  We will plan to get him back in another 4 weeks. Hopefully, by then he would have had his treatment with SRS.  This is a very complicated. I spent a good 40-45 minutes with he and his family today. Their faith is very strong.     [name], MD 10/29/20186:01 PM

## 2016-11-13 LAB — PSA: Prostate Specific Ag, Serum: <0.1 ng/mL (ref 0.0–4.0)

## 2016-11-14 ENCOUNTER — Encounter: Payer: Self-pay | Admitting: Radiation Oncology

## 2016-11-14 DIAGNOSIS — M5417 Radiculopathy, lumbosacral region: Secondary | ICD-10-CM | POA: Diagnosis not present

## 2016-11-14 DIAGNOSIS — M6281 Muscle weakness (generalized): Secondary | ICD-10-CM | POA: Diagnosis not present

## 2016-11-14 DIAGNOSIS — M4710 Other spondylosis with myelopathy, site unspecified: Secondary | ICD-10-CM | POA: Diagnosis not present

## 2016-11-14 DIAGNOSIS — M545 Low back pain: Secondary | ICD-10-CM | POA: Diagnosis not present

## 2016-11-14 LAB — CHROMOGRANIN A: Chromogranin A: 2 nmol/L (ref 0–5)

## 2016-11-15 DIAGNOSIS — M6281 Muscle weakness (generalized): Secondary | ICD-10-CM | POA: Diagnosis not present

## 2016-11-15 DIAGNOSIS — M545 Low back pain: Secondary | ICD-10-CM | POA: Diagnosis not present

## 2016-11-15 DIAGNOSIS — M4710 Other spondylosis with myelopathy, site unspecified: Secondary | ICD-10-CM | POA: Diagnosis not present

## 2016-11-15 DIAGNOSIS — M5417 Radiculopathy, lumbosacral region: Secondary | ICD-10-CM | POA: Diagnosis not present

## 2016-11-15 NOTE — Progress Notes (Signed)
Thoracic Location of Tumor / Histology:   Patient presented for back surgery on August 29. His pathology came back as metastatic carcinoma. It was sent for genetic array which showed a 90% probability of neuroendocrine cancer.   Biopsies revealed:  09/12/16 Diagnosis Soft tissue mass, simple excision, Sacral Tumor - METASTATIC CARCINOMA. - SEE COMMENT. Microscopic Comment Immunohistochemical stains were performed, revealing the malignant cells are positive for cytokeratin 7, TTF-1 and PAX-8. They are negative for cytokeratin 20, prostein, CD10, PSA, GATA-3 and CDX-2. Although this immunohistochemical profile is non-specific, the lack of staining for PSA and prostein coupled with the positive staining for cytokeratin 7 is unusual for metastatic prostatic adenocarcinoma.  Tobacco/Marijuana/Snuff/ETOH use: denies tabacco, marijuana, snuff and ETOH use.  Past/Anticipated interventions by cardiothoracic surgery, if any: no  Past/Anticipated interventions by medical oncology, if any: no  Signs/Symptoms  Weight changes, if any: yes - has lost about 10 lbs over 4-5 months  Respiratory complaints, if any: no  Hemoptysis, if any: no  Pain issues, if any:  no  SAFETY ISSUES:  Prior radiation? 04/22/14-05/06/14 right sacrum 30 gray   Pacemaker/ICD? no   Possible current pregnancy?no  Is the patient on methotrexate? no  Current Complaints / other details:  Dr. Marin Olp is recommending SBRT.  BP 99/71 (BP Location: Left Arm, Patient Position: Sitting)   Pulse 99   Temp 98.5 F (36.9 C) (Oral)   Ht 5\' 8"  (1.727 m)   Wt 144 lb (65.3 kg)   SpO2 100%   BMI 21.90 kg/m    Wt Readings from Last 3 Encounters:  11/21/16 144 lb (65.3 kg)  11/12/16 140 lb (63.5 kg)  10/22/16 142 lb 12.8 oz (64.8 kg)

## 2016-11-19 DIAGNOSIS — M5417 Radiculopathy, lumbosacral region: Secondary | ICD-10-CM | POA: Diagnosis not present

## 2016-11-19 DIAGNOSIS — M6281 Muscle weakness (generalized): Secondary | ICD-10-CM | POA: Diagnosis not present

## 2016-11-19 DIAGNOSIS — M545 Low back pain: Secondary | ICD-10-CM | POA: Diagnosis not present

## 2016-11-19 DIAGNOSIS — M4710 Other spondylosis with myelopathy, site unspecified: Secondary | ICD-10-CM | POA: Diagnosis not present

## 2016-11-21 ENCOUNTER — Ambulatory Visit
Admission: RE | Admit: 2016-11-21 | Discharge: 2016-11-21 | Disposition: A | Discharge status: Home / Self Care | Payer: Medicare Other | Source: Ambulatory Visit | Attending: Radiation Oncology | Admitting: Radiation Oncology

## 2016-11-21 ENCOUNTER — Encounter: Payer: Self-pay | Admitting: Radiation Oncology

## 2016-11-21 VITALS — BP 99/71 | HR 99 | Temp 98.5°F | Ht 68.0 in | Wt 144.0 lb

## 2016-11-21 DIAGNOSIS — R918 Other nonspecific abnormal finding of lung field: Secondary | ICD-10-CM | POA: Diagnosis not present

## 2016-11-21 DIAGNOSIS — C7989 Secondary malignant neoplasm of other specified sites: Secondary | ICD-10-CM | POA: Diagnosis not present

## 2016-11-21 DIAGNOSIS — C61 Malignant neoplasm of prostate: Secondary | ICD-10-CM

## 2016-11-21 DIAGNOSIS — M545 Low back pain: Secondary | ICD-10-CM | POA: Diagnosis not present

## 2016-11-21 DIAGNOSIS — C7801 Secondary malignant neoplasm of right lung: Secondary | ICD-10-CM | POA: Diagnosis not present

## 2016-11-21 DIAGNOSIS — C7951 Secondary malignant neoplasm of bone: Secondary | ICD-10-CM | POA: Diagnosis not present

## 2016-11-21 DIAGNOSIS — M4710 Other spondylosis with myelopathy, site unspecified: Secondary | ICD-10-CM | POA: Diagnosis not present

## 2016-11-21 DIAGNOSIS — C7802 Secondary malignant neoplasm of left lung: Secondary | ICD-10-CM | POA: Diagnosis not present

## 2016-11-21 DIAGNOSIS — M6281 Muscle weakness (generalized): Secondary | ICD-10-CM | POA: Diagnosis not present

## 2016-11-21 DIAGNOSIS — M5417 Radiculopathy, lumbosacral region: Secondary | ICD-10-CM | POA: Diagnosis not present

## 2016-11-21 NOTE — Progress Notes (Signed)
Radiation Oncology         [phone] ________________________________  Name: [name] MRN: [id number]  Date: 11/21/2016  DOB: Nov 15, 1945  Re-consultation Note  CC: Nicoletta Dress, MD  [name], MD    ICD-10-CM   1. 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 Of note since the patient last visit, a PET scan was done.  The patient has had a soft tissue mass excised from the sacrum completed on 09/12/2016 with results revealing malignant cells consistent with metastatic carcinoma. His last MRI of the right hip on 10/04/2016 revealed no significant change in size of the sacral metastasis from the previous MRI on 07/28/16.   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 No primary lesion was identified and no evidence of metastatic disease was found in the abdomen or pelvis. The sclerotic lesion of the right sacrum is stable.  Pt presents to the office today accompanied by family members. He reports that he is doing well overall. He states the back surgery he recently had has completely alleviated his sciatic pain. He reports atrophy of the RLE since the surgery and is currently in physical therapy for a total of 10 weeks with 6 weeks remaining. He is currently using a walker for assistance in ambulation.    Patient was referred for SBRT by Dr. Marin Olp who would like to wait until this is completed before discussing any chemotherapy treatment.   On review of systems, he reports weight loss of about 10 pounds over the past 4-5 months. He denies any pain, hemoptysis, SOB. He denies smoking, ETOH, marijuana or smokeless  tobacco use.                               ALLERGIES:  is allergic to codeine and hydrocodone.  Meds: Current Outpatient Medications  Medication Sig Dispense Refill  . acetaminophen (TYLENOL) 500 MG tablet Take 1,000 mg by mouth every 4 (four) hours as needed for moderate pain.    Marland Kitchen aspirin 81 MG tablet Take 81 mg by mouth daily.    . Calcium Carbonate-Vitamin D (CALCIUM + D PO) Take 770 mg by mouth 2 (two) times daily.     Marland Kitchen CALCIUM-IRON-VIT D-VIT K PO Take 1 tablet by mouth 2 (two) times daily.    Marland Kitchen gabapentin (NEURONTIN) 300 MG capsule TAKE 1 CAPSULE BY MOUTH TWICE A DAY THEN TAKE 2 CAPSULES AT BEDTIME 120 capsule 4  . isosorbide mononitrate (IMDUR) 30 MG 24 hr tablet Take 30 mg by mouth daily.    . megestrol (MEGACE) 20 MG tablet Take 1 tablet (20 mg total) by mouth daily. 30 tablet 3  . metFORMIN (GLUCOPHAGE-XR) 500 MG 24 hr tablet TAKE TWO TABLETS BY MOUTH DAILY WITH BREAKFAST 180 tablet 2  . OVER THE COUNTER MEDICATION every morning. Juice Plus -- 8 capsule of each the garden, vineyard, and orchead    . predniSONE (DELTASONE) 5 MG tablet TAKE 1 TABLET BY MOUTH DAILY 60 tablet 3  . traMADol (ULTRAM) 50 MG tablet Take 1 tablet (50 mg total) by mouth every 6 (six) hours as needed.  90 tablet 2  . UNABLE TO FIND Med Name: Juice Plus Omega Blend    . UNABLE TO FIND Med Name: Juice Plus [address] [place], [employer]    . VESICARE 5 MG tablet TAKE 1 TABLET BY MOUTH DAILY. (Patient taking differently: TAKE 1 TABLET BY MOUTH TWICE A WEEK) 30 tablet 4  . ZETIA 10 MG tablet Take 10 mg by mouth daily.     . cyclobenzaprine (FLEXERIL) 10 MG tablet Take 1 tablet (10 mg total) by mouth 3 (three) times daily as needed for muscle spasms. (Patient not taking: Reported on 11/21/2016) 50 tablet 0  . dronabinol (MARINOL) 2.5 MG capsule Take 1 capsule (2.5 mg total) by mouth 2 (two) times daily before lunch and supper. (Patient not taking: Reported on 11/21/2016) 60 capsule 0  . HYDROmorphone  (DILAUDID) 4 MG tablet Take by mouth every 4 (four) hours as needed for severe pain.    Marland Kitchen lactulose, encephalopathy, (CHRONULAC) 10 GM/15ML SOLN daily as needed.   2  . magic mouthwash w/lidocaine SOLN Take 5 mLs by mouth 4 (four) times daily as needed for mouth pain. (Patient not taking: Reported on 11/21/2016) 500 mL 6  . ondansetron (ZOFRAN ODT) 4 MG disintegrating tablet Take 1 tablet (4 mg total) by mouth every 8 (eight) hours as needed for nausea or vomiting. (Patient not taking: Reported on 11/21/2016) 30 tablet 2  . prochlorperazine (COMPAZINE) 10 MG tablet Take 1 tablet (10 mg total) by mouth every 6 (six) hours as needed for nausea or vomiting. (Patient not taking: Reported on 11/21/2016) 30 tablet 0   No current facility-administered medications for this encounter.     Physical Findings: The patient is in no acute distress. Patient is alert and oriented.  height is 5\' 8"  (1.727 m) and weight is 144 lb (65.3 kg). His oral temperature is 98.5 F (36.9 C). His blood pressure is 99/71 and his pulse is 99. His oxygen saturation is 100%. .    Right leg wrapped with ace bandage. RLE weakness secondary to recent surgery. Ambulates with the assistance of a walker  Lab Findings: Lab Results  Component Value Date   WBC 7.4 11/12/2016   HGB 12.7 (L) 11/12/2016   HCT 38.3 (L) 11/12/2016   MCV 94 11/12/2016   PLT 294 11/12/2016    Radiographic Findings: Nm Pet (netspot Ga 68 Dotatate) Skull Base To Mid Thigh  Result Date: 11/06/2016 EXAM: NUCLEAR MEDICINE PET SKULL BASE TO THIGH TECHNIQUE: 5.7 mCi gallium 68 DOTATATE was injected intravenously. Full-ring PET imaging was performed from the skull base to thigh after the radiotracer. CT data was obtained and used for attenuation correction and anatomic localization. FASTING BLOOD GLUCOSE:  FASTING BLOOD GLUCOSE Not applicable COMPARISON:  Fluciclovine PET scan 322 18. FINDINGS: NECK No abnormal radiotracer accumulation in the neck CHEST  Bilateral pulmonary nodules are again demonstrated. These nodules have associated radiotracer activity and mildly increased in size. For example nodule in the RIGHT azygos esophageal recess measures 16 mm (image 90, series 4) increased from 14 mm with SUV max equal 3.2. Nodule in the RIGHT lower lobe over the RIGHT hemidiaphragm measures 11 mm (image 83, series 4) compared with 9 mm in with SUV max equal 1.8. Nodule noted also in the LEFT lung lingula (image 88, series 4). Some smaller nodules in the upper lobes not have a clear radiotracer activity. No hypermetabolic nodes. ABDOMEN/PELVIS No abnormal radiotracer in the liver. No focal activity within the bowel. No hypermetabolic abdominopelvic  lymph nodes. Physiologic activity noted within the adrenal glands spleen and kidneys. Post prostatectomy.  No abnormality in the prostate bed. SKELETON No focal tracer activity to suggest neuroendocrine skeletal metastasis. Sclerotic lesion in the RIGHT sacral ala unchanged from comparison exam. IMPRESSION: 1. 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 2. No evidence of metastatic disease in the abdomen or pelvis. 3. No primary lesion identified. 4. No evidence skeletal metastatic neuroendocrine tumor. 5. Stable sclerotic lesion in the RIGHT sacrum. Electronically Signed   By: Suzy Bouchard M.D.   On: 11/06/2016 15:04    Impression: Metastatic castrate resistant prostate cancer.  Recent biopsy is consistent with neuroendocrine tumor   Patient may be a candidate for SBRT for his PET positive nodules of the lung (oligometastasis), however in reviewing patient's chest CT he has multiple pulmonary nodules throughout bilateral lungs which are small but could be metastatic disease in addition but below the resolution of PET scan. I would like to review the patient's most recent PET scan with  nuclear medicine to evaluate if these smaller nodules may be below the resolution of PET scan sensitivity. If these multiple nodules appear to be clinically suspicious, then I would not recommend SBRT.   Plan: Treatment pending review of PET scan with nuclear medicine.   ____________________________________   -----------------------------------  Blair Promise, PhD, MD   This document serves as a record of services personally performed by [name], MD. It was created on her behalf by Marlowe Kays, a trained medical scribe. The creation of this record is based on the scribe's personal observations and the provider's statements to them. This document has been checked and approved by the attending provider.

## 2016-11-22 DIAGNOSIS — M5417 Radiculopathy, lumbosacral region: Secondary | ICD-10-CM | POA: Diagnosis not present

## 2016-11-22 DIAGNOSIS — M545 Low back pain: Secondary | ICD-10-CM | POA: Diagnosis not present

## 2016-11-22 DIAGNOSIS — M6281 Muscle weakness (generalized): Secondary | ICD-10-CM | POA: Diagnosis not present

## 2016-11-22 DIAGNOSIS — M4710 Other spondylosis with myelopathy, site unspecified: Secondary | ICD-10-CM | POA: Diagnosis not present

## 2016-11-26 DIAGNOSIS — R197 Diarrhea, unspecified: Secondary | ICD-10-CM | POA: Diagnosis not present

## 2016-11-26 DIAGNOSIS — Z6822 Body mass index (BMI) 22.0-22.9, adult: Secondary | ICD-10-CM | POA: Diagnosis not present

## 2016-11-27 ENCOUNTER — Telehealth: Payer: Self-pay | Admitting: Oncology

## 2016-11-27 ENCOUNTER — Telehealth: Payer: Self-pay | Admitting: *Deleted

## 2016-11-27 DIAGNOSIS — C7951 Secondary malignant neoplasm of bone: Secondary | ICD-10-CM

## 2016-11-27 DIAGNOSIS — C61 Malignant neoplasm of prostate: Secondary | ICD-10-CM

## 2016-11-27 NOTE — Telephone Encounter (Signed)
Patient c/o pressure in his bladder and being unable to empty when voiding. He would like a referral to urology.  Spoke with Dr Marin Olp. A referral to Dr Diona Fanti can be made. Order placed. Patient aware.

## 2016-11-27 NOTE — Telephone Encounter (Signed)
Patient's wife left a message.  They are asking for a return call regarding the decision about [name] treatment.  They can be reached at [phone] or [phone].

## 2016-11-28 ENCOUNTER — Other Ambulatory Visit: Payer: Self-pay | Admitting: Hematology & Oncology

## 2016-11-28 DIAGNOSIS — C7A8 Other malignant neuroendocrine tumors: Secondary | ICD-10-CM

## 2016-11-28 DIAGNOSIS — C61 Malignant neoplasm of prostate: Secondary | ICD-10-CM

## 2016-11-28 DIAGNOSIS — C7B8 Other secondary neuroendocrine tumors: Secondary | ICD-10-CM

## 2016-11-30 ENCOUNTER — Other Ambulatory Visit: Payer: Self-pay | Admitting: Hematology & Oncology

## 2016-11-30 ENCOUNTER — Telehealth: Payer: Self-pay | Admitting: *Deleted

## 2016-11-30 DIAGNOSIS — G63 Polyneuropathy in diseases classified elsewhere: Principal | ICD-10-CM

## 2016-11-30 DIAGNOSIS — C801 Malignant (primary) neoplasm, unspecified: Secondary | ICD-10-CM

## 2016-11-30 DIAGNOSIS — C61 Malignant neoplasm of prostate: Secondary | ICD-10-CM

## 2016-11-30 MED ORDER — LIDOCAINE HCL 2 % EX GEL: 1.0000 "application " | CUTANEOUS | 3 refills | Status: DC | PRN

## 2016-11-30 MED FILL — LIDOCAINE HCL 2% JELLY: 2 | 30 days supply | Qty: 30 | Fill #0

## 2016-11-30 MED FILL — GABAPENTIN 300 MG CAPSULE: 300 | 30 days supply | Qty: 120 | Fill #0

## 2016-11-30 MED FILL — EZETIMIBE 10 MG TABS: 10 | 90 days supply | Qty: 90 | Fill #3

## 2016-11-30 MED FILL — MEGESTROL 20 MG TABLET: 20 | 30 days supply | Qty: 30 | Fill #2

## 2016-11-30 NOTE — Telephone Encounter (Signed)
Patient has been having difficulty with diarrhea. He is being managed by a GI physician in [place] and is on a medication VIBERZI. This has slowed the stool but he's still having loose stool and his rectum is extremely painful. He would like something to help.  This office will send in a prescription for lidocaine jelly for pain control. Also instructed patient to call his GI MD for follow up on the continued diarrhea to see if anything additional can be done for control.  Patient aware of new prescription and agreeable to call his MD to notify them of continue diarrhea issues.

## 2016-12-01 ENCOUNTER — Emergency Department (HOSPITAL_BASED_OUTPATIENT_CLINIC_OR_DEPARTMENT_OTHER)
Admission: EM | Admit: 2016-12-01 | Discharge: 2016-12-01 | Disposition: A | Discharge status: Home / Self Care | Payer: Medicare Other | Attending: Emergency Medicine | Admitting: Emergency Medicine

## 2016-12-01 ENCOUNTER — Emergency Department (HOSPITAL_BASED_OUTPATIENT_CLINIC_OR_DEPARTMENT_OTHER): Payer: Medicare Other

## 2016-12-01 ENCOUNTER — Other Ambulatory Visit: Payer: Self-pay

## 2016-12-01 ENCOUNTER — Encounter (HOSPITAL_BASED_OUTPATIENT_CLINIC_OR_DEPARTMENT_OTHER): Payer: Self-pay | Admitting: *Deleted

## 2016-12-01 DIAGNOSIS — Z7984 Long term (current) use of oral hypoglycemic drugs: Secondary | ICD-10-CM | POA: Diagnosis not present

## 2016-12-01 DIAGNOSIS — K6289 Other specified diseases of anus and rectum: Secondary | ICD-10-CM | POA: Diagnosis not present

## 2016-12-01 DIAGNOSIS — N3289 Other specified disorders of bladder: Secondary | ICD-10-CM | POA: Diagnosis not present

## 2016-12-01 DIAGNOSIS — E119 Type 2 diabetes mellitus without complications: Secondary | ICD-10-CM | POA: Diagnosis not present

## 2016-12-01 DIAGNOSIS — Z8546 Personal history of malignant neoplasm of prostate: Secondary | ICD-10-CM | POA: Insufficient documentation

## 2016-12-01 DIAGNOSIS — Z79899 Other long term (current) drug therapy: Secondary | ICD-10-CM | POA: Insufficient documentation

## 2016-12-01 DIAGNOSIS — Z923 Personal history of irradiation: Secondary | ICD-10-CM | POA: Insufficient documentation

## 2016-12-01 DIAGNOSIS — I10 Essential (primary) hypertension: Secondary | ICD-10-CM | POA: Insufficient documentation

## 2016-12-01 DIAGNOSIS — Z7982 Long term (current) use of aspirin: Secondary | ICD-10-CM | POA: Insufficient documentation

## 2016-12-01 DIAGNOSIS — R197 Diarrhea, unspecified: Secondary | ICD-10-CM | POA: Insufficient documentation

## 2016-12-01 DIAGNOSIS — R109 Unspecified abdominal pain: Secondary | ICD-10-CM | POA: Diagnosis present

## 2016-12-01 DIAGNOSIS — E860 Dehydration: Secondary | ICD-10-CM | POA: Diagnosis not present

## 2016-12-01 DIAGNOSIS — Z85830 Personal history of malignant neoplasm of bone: Secondary | ICD-10-CM | POA: Insufficient documentation

## 2016-12-01 LAB — URINALYSIS, ROUTINE W REFLEX MICROSCOPIC
Bilirubin Urine: NEGATIVE
Glucose, UA: NEGATIVE mg/dL
Hgb urine dipstick: NEGATIVE
Ketones, ur: NEGATIVE mg/dL
Leukocytes, UA: NEGATIVE
Nitrite: NEGATIVE
Protein, ur: NEGATIVE mg/dL
Specific Gravity, Urine: 1.01 (ref 1.005–1.030)
pH: 7.5 (ref 5.0–8.0)

## 2016-12-01 LAB — COMPREHENSIVE METABOLIC PANEL
ALT: 14 U/L — ABNORMAL LOW (ref 17–63)
AST: 21 U/L (ref 15–41)
Albumin: 3.8 g/dL (ref 3.5–5.0)
Alkaline Phosphatase: 59 U/L (ref 38–126)
Anion gap: 10 (ref 5–15)
BUN: 17 mg/dL (ref 6–20)
CO2: 25 mmol/L (ref 22–32)
Calcium: 9.8 mg/dL (ref 8.9–10.3)
Chloride: 104 mmol/L (ref 101–111)
Creatinine, Ser: 1.04 mg/dL (ref 0.61–1.24)
GFR calc Af Amer: >60 mL/min (ref >60)
GFR calc non Af Amer: >60 mL/min (ref >60)
Glucose, Bld: 107 mg/dL — ABNORMAL HIGH (ref 65–99)
Potassium: 3.5 mmol/L (ref 3.5–5.1)
Sodium: 139 mmol/L (ref 135–145)
Total Bilirubin: 0.5 mg/dL (ref 0.3–1.2)
Total Protein: 7.3 g/dL (ref 6.5–8.1)

## 2016-12-01 LAB — CBC WITH DIFFERENTIAL/PLATELET
Basophils Absolute: 0 10*3/uL (ref 0.0–0.1)
Basophils Relative: 1 %
Eosinophils Absolute: 0.1 10*3/uL (ref 0.0–0.7)
Eosinophils Relative: 1 %
HCT: 37.7 % — ABNORMAL LOW (ref 39.0–52.0)
Hemoglobin: 12.2 g/dL — ABNORMAL LOW (ref 13.0–17.0)
Lymphocytes Relative: 14 %
Lymphs Abs: 1.1 10*3/uL (ref 0.7–4.0)
MCH: 30.3 pg (ref 26.0–34.0)
MCHC: 32.4 g/dL (ref 30.0–36.0)
MCV: 93.8 fL (ref 78.0–100.0)
Monocytes Absolute: 0.8 10*3/uL (ref 0.1–1.0)
Monocytes Relative: 10 %
Neutro Abs: 6 10*3/uL (ref 1.7–7.7)
Neutrophils Relative %: 74 %
Platelets: 316 10*3/uL (ref 150–400)
RBC: 4.02 MIL/uL — ABNORMAL LOW (ref 4.22–5.81)
RDW: 13.9 % (ref 11.5–15.5)
WBC: 8 10*3/uL (ref 4.0–10.5)

## 2016-12-01 MED ORDER — METRONIDAZOLE 500 MG PO TABS: 500.0000 mg | ORAL_TABLET | Freq: Two times a day (BID) | ORAL | 0 refills | Status: DC

## 2016-12-01 MED ORDER — IOPAMIDOL (ISOVUE-300) INJECTION 61%
100.0000 mL | Freq: Once | INTRAVENOUS | Status: AC | PRN
  Administered 2016-12-01: 100 mL via INTRAVENOUS

## 2016-12-01 MED ORDER — CIPROFLOXACIN HCL 500 MG PO TABS
500.0000 mg | ORAL_TABLET | Freq: Once | ORAL | Status: AC
  Administered 2016-12-01: 500 mg via ORAL
  Filled 2016-12-01: qty 1

## 2016-12-01 MED ORDER — CIPROFLOXACIN HCL 500 MG PO TABS: 500.0000 mg | ORAL_TABLET | Freq: Two times a day (BID) | ORAL | 0 refills | Status: DC

## 2016-12-01 MED ORDER — SODIUM CHLORIDE 0.9 % IV BOLUS (SEPSIS)
1000.0000 mL | Freq: Once | INTRAVENOUS | Status: AC
  Administered 2016-12-01: 1000 mL via INTRAVENOUS

## 2016-12-01 MED ORDER — ZINC OXIDE 11.3 % EX CREA
TOPICAL_CREAM | CUTANEOUS | Status: AC
  Filled 2016-12-01: qty 56

## 2016-12-01 MED ORDER — METRONIDAZOLE 500 MG PO TABS
500.0000 mg | ORAL_TABLET | Freq: Once | ORAL | Status: AC
  Administered 2016-12-01: 500 mg via ORAL
  Filled 2016-12-01: qty 1

## 2016-12-01 NOTE — ED Notes (Signed)
Received call from Abby at [facility], unable to process stool for C.Diff due to being a formed stool. ED MD informed

## 2016-12-01 NOTE — ED Notes (Signed)
Pt requests some cream for his rectal area. C/o "raw and painful" area. RN made aware.

## 2016-12-01 NOTE — ED Notes (Signed)
Pt and wife given d/c instructions as per chart. rx x 2. Verbalizes understanding. No questions.

## 2016-12-01 NOTE — ED Triage Notes (Signed)
Pt reports being sent here for fluids and labs d/t dehydration. Was seen this am by Dr. Delena Bali. Pt is a cancer -- no chemo or radiation at this time. Reports diarrhea x1wk. Denies fever.

## 2016-12-01 NOTE — ED Provider Notes (Signed)
[facility] EMERGENCY DEPARTMENT Provider Note   CSN: [id number] Arrival date & time: 12/01/16  1058     History   Chief Complaint Chief Complaint  Patient presents with  . Diarrhea    HPI [name] is a 71 y.o. male.  Pt presents to the ED today with diarrhea and abdominal pain.  The pt said he's had diarrhea for about 1 week.  He used imodium without help in sx relief.  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  He did receive denosumab and Leupron on 10/29.  No chemo.  Pt did f/u with pcp on Monday the 12th.  He was put on viberzi which has slowed down the diarrhea a little, but not much.  Pt said he has no control over his bowels.  When he has to have a bowel movement, he has no time to get to the bathroom.  He went back to his doctor today, who sent him here.  Stool sample given to pcp this morning.  No results yet.  The pt c/o abdominal pressure, but not pain.  He denies any f/c.  No recent antibiotics.      Past Medical History:  Diagnosis Date  . Complication of anesthesia   . Constipation    due to pain medication  . 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 Dyspnea    with exertion  . Essential hypertension 07/12/2014  . High blood sugar   . PONV (postoperative nausea and vomiting)   . Prostate cancer ([place]) 07/23/2011   with mets to sacral, lungs  . Radiation 04/22/14-05/06/14   right sacrum 30 gray    Patient Active Problem List   Diagnosis Date Noted  . Metastasis to bone ([place]) 09/12/2016  . Cancer, metastatic to bone ([place]) 08/24/2015  . Dyslipidemia 07/12/2014  . Essential hypertension 07/12/2014  . Ischemic chest pain 07/12/2014  . Pain of right posterior thigh 02/15/2014  . Prostate cancer ([place]) 07/23/2011    Past Surgical History:  Procedure Laterality Date  . CIRCUMCISION     when patient was 71 years old  . COLONOSCOPY    . COLONOSCOPY W/ POLYPECTOMY     . ESOPHAGOGASTRODUODENOSCOPY    . HAND SURGERY Left    fracture  . LAMINECTOMY AND FORAMINOTOMY RIGHT LUMBAR FIVE- SACRAL ONE, SACRAL ONE- SACRAL TWO DECOMPRESSION OF RIGHT SACRAL ONE NERVE ROOT Right 09/12/2016   Performed by [name], MD at [facility]    . TONSILECTOMY/ADENOIDECTOMY WITH MYRINGOTOMY         Home Medications    Prior to Admission medications   Medication Sig Start Date End Date Taking? Authorizing Provider  acetaminophen (TYLENOL) 500 MG tablet Take 1,000 mg by mouth every 4 (four) hours as needed for moderate pain.   Yes [provider]  aspirin 81 MG tablet Take 81 mg by mouth daily.   Yes [provider]  Calcium Carbonate-Vitamin D (CALCIUM + D PO) Take 770 mg by mouth 2 (two) times daily.    Yes [provider]  CALCIUM-IRON-VIT D-VIT K PO Take 1 tablet by mouth 2 (two) times daily.   Yes [provider]  Eluxadoline (VIBERZI) 75 MG TABS Take 75 mg 2 (two) times daily by mouth.   Yes [provider]  gabapentin (NEURONTIN) 300 MG capsule TAKE 1 CAPSULE BY MOUTH TWICE A DAY THEN TAKE 2  CAPSULES AT BEDTIME 11/30/16  Yes [place], Holli Humbles, NP  HYDROmorphone (DILAUDID) 4 MG tablet Take by mouth every 4 (four) hours as needed for severe pain.   Yes [provider]  isosorbide mononitrate (IMDUR) 30 MG 24 hr tablet Take 30 mg by mouth daily.   Yes [provider]  lactulose, encephalopathy, (CHRONULAC) 10 GM/15ML SOLN daily as needed.  09/10/16  Yes [provider]  lidocaine (XYLOCAINE) 2 % jelly Apply 1 application as needed topically. 11/30/16  Yes [place], Holli Humbles, NP  magic mouthwash w/lidocaine SOLN Take 5 mLs by mouth 4 (four) times daily as needed for mouth pain. 08/31/16  Yes [name], MD  megestrol (MEGACE) 20 MG tablet Take 1 tablet (20 mg total) by mouth daily. 10/12/16  Yes [name], MD  metFORMIN (GLUCOPHAGE-XR)  500 MG 24 hr tablet TAKE TWO TABLETS BY MOUTH DAILY WITH BREAKFAST 08/03/16  Yes [place], Rudell Cobb, MD  ondansetron (ZOFRAN ODT) 4 MG disintegrating tablet Take 1 tablet (4 mg total) by mouth every 8 (eight) hours as needed for nausea or vomiting. 08/17/16  Yes [name], MD  OVER THE COUNTER MEDICATION every morning. Juice Plus -- 8 capsule of each the garden, vineyard, and orchead   Yes [provider]  predniSONE (DELTASONE) 5 MG tablet TAKE 1 TABLET BY MOUTH DAILY 08/03/16  Yes [place], Rudell Cobb, MD  prochlorperazine (COMPAZINE) 10 MG tablet Take 1 tablet (10 mg total) by mouth every 6 (six) hours as needed for nausea or vomiting. 09/22/15  Yes [name], MD  traMADol (ULTRAM) 50 MG tablet Take 1 tablet (50 mg total) by mouth every 6 (six) hours as needed. 08/17/16  Yes [name], MD  UNABLE TO FIND Med Name: Juice Plus Omega Blend   Yes [provider]  UNABLE TO FIND Med Name: Juice Plus [address], Garden Blend, [place]   Yes [provider]  VESICARE 5 MG tablet TAKE 1 TABLET BY MOUTH DAILY. Patient taking differently: TAKE 1 TABLET BY MOUTH TWICE A WEEK 07/16/16  Yes [place], Rudell Cobb, MD  ZETIA 10 MG tablet Take 10 mg by mouth daily.  06/29/14  Yes [provider]  ciprofloxacin (CIPRO) 500 MG tablet Take 1 tablet (500 mg total) 2 (two) times daily by mouth. 12/01/16   Isla Pence, MD  cyclobenzaprine (FLEXERIL) 10 MG tablet Take 1 tablet (10 mg total) by mouth 3 (three) times daily as needed for muscle spasms. Patient not taking: Reported on 11/21/2016 09/13/16   [name], MD  dronabinol (MARINOL) 2.5 MG capsule Take 1 capsule (2.5 mg total) by mouth 2 (two) times daily before lunch and supper. Patient not taking: Reported on 11/21/2016 10/12/16   [name], MD  metroNIDAZOLE (FLAGYL) 500 MG tablet Take 1 tablet (500 mg total) 2 (two) times daily by mouth. 12/01/16   Isla Pence, MD    Family History Family History    Problem Relation Age of Onset  . Prostate cancer Father   . Heart disease Father   . Heart disease Mother     Social History Social History   Tobacco Use  . Smoking status: Never Smoker  . Smokeless tobacco: Never Used  . Tobacco comment: never used tobacco  Substance Use Topics  . Alcohol use: No    Alcohol/week: 0.0 oz  . Drug use: No     Allergies   Codeine and Hydrocodone   Review of Systems Review of Systems  Gastrointestinal: Positive for diarrhea and rectal pain.  All other systems reviewed and are negative.    Physical Exam Updated Vital Signs BP 138/71 (BP Location: Left Arm)   Pulse (!) 101   Temp 99.4 F (37.4 C) (Oral)   Resp 18   SpO2 100%   Physical Exam  Constitutional: He is oriented to person, place, and time. He appears well-developed and well-nourished.  HENT:  Head: Normocephalic and atraumatic.  Right Ear: External ear normal.  Left Ear: External ear normal.  Nose: Nose normal.  Mouth/Throat: Mucous membranes are dry.  Eyes: Conjunctivae and EOM are normal. Pupils are equal, round, and reactive to light.  Neck: Normal range of motion. Neck supple.  Cardiovascular: Regular rhythm, normal heart sounds and intact distal pulses. Tachycardia present.  Pulmonary/Chest: Effort normal and breath sounds normal.  Abdominal: Soft. Bowel sounds are normal.  Musculoskeletal: Normal range of motion.  Neurological: He is alert and oriented to person, place, and time.  Skin: Skin is warm and dry.  Psychiatric: He has a normal mood and affect. His behavior is normal. Judgment and thought content normal.  Nursing note and vitals reviewed.    ED Treatments / Results  Labs (all labs ordered are listed, but only abnormal results are displayed) Labs Reviewed  CBC WITH DIFFERENTIAL/PLATELET - Abnormal; Notable for the following components:      Result Value   RBC 4.02 (*)    Hemoglobin 12.2 (*)    HCT 37.7 (*)    All other components within normal  limits  COMPREHENSIVE METABOLIC PANEL - Abnormal; Notable for the following components:   Glucose, Bld 107 (*)    ALT 14 (*)    All other components within normal limits  GASTROINTESTINAL PANEL BY PCR, STOOL (REPLACES STOOL CULTURE)  C DIFFICILE QUICK SCREEN W PCR REFLEX  URINALYSIS, ROUTINE W REFLEX MICROSCOPIC    EKG  EKG Interpretation None       Radiology Ct Abdomen Pelvis W Contrast  Result Date: 12/01/2016 CLINICAL DATA:  Abdominal pain, nausea and diarrhea. History of metastatic prostate carcinoma. EXAM: CT ABDOMEN AND PELVIS WITH CONTRAST TECHNIQUE: Multidetector CT imaging of the abdomen and pelvis was performed using the standard protocol following bolus administration of intravenous contrast. CONTRAST:  112mL ISOVUE-300 IOPAMIDOL (ISOVUE-300) INJECTION 61% COMPARISON:  PET scan on 11/06/2016 FINDINGS: Lower chest: Multiple metastatic pulmonary nodules again noted at both lung bases. No pleural effusions. Hepatobiliary: Stable cyst in left lobe of liver. No gallstones, gallbladder wall thickening, or biliary dilatation. Pancreas: Unremarkable. No pancreatic ductal dilatation or surrounding inflammatory changes. Spleen: Normal in size without focal abnormality. Adrenals/Urinary Tract: Adrenal glands and kidneys appear unremarkable. The bladder is very distended with urine and there may be a component of relative outlet obstruction. Stomach/Bowel: No evidence of bowel obstruction. There are multiple non dilated and fluid filled loops of small bowel. This is nonspecific but may be indicative of enteritis. There maybe some relative thickening of the distal rectum. No free air. Vascular/Lymphatic: No significant vascular findings are present. No enlarged abdominal or pelvic lymph nodes. Reproductive: Status post prostatectomy. Other: No abdominal wall hernia or abnormality. No abdominopelvic ascites. Musculoskeletal: Sclerotic metastatic disease to the right sacrum appears stable.  IMPRESSION: 1. Stable metastatic pulmonary nodules at the lung bases. 2. Distended urinary bladder. 3. Nonspecific fluid filled small bowel loops which may be indicative of enteritis. 4. Possible relative thickening of the distal rectum which could be indicative of proctitis. Electronically Signed   By: Aletta Edouard  M.D.   On: 12/01/2016 13:46    Procedures Procedures (including critical care time)  Medications Ordered in ED Medications  zinc oxide (BALMEX) 11.3 % cream (not administered)  sodium chloride 0.9 % bolus 1,000 mL (0 mLs Intravenous Stopped 12/01/16 1337)  iopamidol (ISOVUE-300) 61 % injection 100 mL (100 mLs Intravenous Contrast Given 12/01/16 1306)  sodium chloride 0.9 % bolus 1,000 mL (0 mLs Intravenous Stopped 12/01/16 1439)  ciprofloxacin (CIPRO) tablet 500 mg (500 mg Oral Given 12/01/16 1439)  metroNIDAZOLE (FLAGYL) tablet 500 mg (500 mg Oral Given 12/01/16 1439)     Initial Impression / Assessment and Plan / ED Course  I have reviewed the triage vital signs and the nursing notes.  Pertinent labs & imaging results that were available during my care of the patient were reviewed by me and considered in my medical decision making (see chart for details).    Pt looks much better.  He is feeling much better.  He is able to tolerate fluids.  [facility]tool [facility].  Pt knows we will call if anything is +.  Pt knows to return if worse and to f/u with pcp.  Final Clinical Impressions(s) / ED Diagnoses   Final diagnoses:  Proctitis  Diarrhea in adult patient  Dehydration    ED Discharge Orders        Ordered    ciprofloxacin (CIPRO) 500 MG tablet  2 times daily     12/01/16 1458    metroNIDAZOLE (FLAGYL) 500 MG tablet  2 times daily     12/01/16 1458       Isla Pence, MD 12/01/16 1525

## 2016-12-02 LAB — GASTROINTESTINAL PANEL BY PCR, STOOL (REPLACES STOOL CULTURE)

## 2016-12-03 ENCOUNTER — Other Ambulatory Visit (HOSPITAL_COMMUNITY): Payer: Medicare Other

## 2016-12-03 DIAGNOSIS — M5417 Radiculopathy, lumbosacral region: Secondary | ICD-10-CM | POA: Diagnosis not present

## 2016-12-03 DIAGNOSIS — M4710 Other spondylosis with myelopathy, site unspecified: Secondary | ICD-10-CM | POA: Diagnosis not present

## 2016-12-03 DIAGNOSIS — M545 Low back pain: Secondary | ICD-10-CM | POA: Diagnosis not present

## 2016-12-03 DIAGNOSIS — M6281 Muscle weakness (generalized): Secondary | ICD-10-CM | POA: Diagnosis not present

## 2016-12-05 DIAGNOSIS — M4710 Other spondylosis with myelopathy, site unspecified: Secondary | ICD-10-CM | POA: Diagnosis not present

## 2016-12-05 DIAGNOSIS — M5417 Radiculopathy, lumbosacral region: Secondary | ICD-10-CM | POA: Diagnosis not present

## 2016-12-05 DIAGNOSIS — M545 Low back pain: Secondary | ICD-10-CM | POA: Diagnosis not present

## 2016-12-05 DIAGNOSIS — M6281 Muscle weakness (generalized): Secondary | ICD-10-CM | POA: Diagnosis not present

## 2016-12-05 MED FILL — predniSONE 5 MG TABS: 5 | 60 days supply | Qty: 60 | Fill #2

## 2016-12-10 ENCOUNTER — Other Ambulatory Visit: Payer: Medicare Other

## 2016-12-10 ENCOUNTER — Ambulatory Visit: Payer: Medicare Other | Admitting: Hematology & Oncology

## 2016-12-10 ENCOUNTER — Ambulatory Visit: Payer: Medicare Other

## 2016-12-10 DIAGNOSIS — Z79899 Other long term (current) drug therapy: Secondary | ICD-10-CM | POA: Diagnosis not present

## 2016-12-10 DIAGNOSIS — M4710 Other spondylosis with myelopathy, site unspecified: Secondary | ICD-10-CM | POA: Diagnosis not present

## 2016-12-10 DIAGNOSIS — Z6821 Body mass index (BMI) 21.0-21.9, adult: Secondary | ICD-10-CM | POA: Diagnosis not present

## 2016-12-10 DIAGNOSIS — E860 Dehydration: Secondary | ICD-10-CM | POA: Diagnosis not present

## 2016-12-10 DIAGNOSIS — R197 Diarrhea, unspecified: Secondary | ICD-10-CM | POA: Diagnosis not present

## 2016-12-10 DIAGNOSIS — M545 Low back pain: Secondary | ICD-10-CM | POA: Diagnosis not present

## 2016-12-10 DIAGNOSIS — M5417 Radiculopathy, lumbosacral region: Secondary | ICD-10-CM | POA: Diagnosis not present

## 2016-12-10 DIAGNOSIS — M6281 Muscle weakness (generalized): Secondary | ICD-10-CM | POA: Diagnosis not present

## 2016-12-12 DIAGNOSIS — M4710 Other spondylosis with myelopathy, site unspecified: Secondary | ICD-10-CM | POA: Diagnosis not present

## 2016-12-12 DIAGNOSIS — M545 Low back pain: Secondary | ICD-10-CM | POA: Diagnosis not present

## 2016-12-12 DIAGNOSIS — M6281 Muscle weakness (generalized): Secondary | ICD-10-CM | POA: Diagnosis not present

## 2016-12-12 DIAGNOSIS — M5417 Radiculopathy, lumbosacral region: Secondary | ICD-10-CM | POA: Diagnosis not present

## 2016-12-13 DIAGNOSIS — M4710 Other spondylosis with myelopathy, site unspecified: Secondary | ICD-10-CM | POA: Diagnosis not present

## 2016-12-13 DIAGNOSIS — M5417 Radiculopathy, lumbosacral region: Secondary | ICD-10-CM | POA: Diagnosis not present

## 2016-12-13 DIAGNOSIS — M6281 Muscle weakness (generalized): Secondary | ICD-10-CM | POA: Diagnosis not present

## 2016-12-13 DIAGNOSIS — M545 Low back pain: Secondary | ICD-10-CM | POA: Diagnosis not present

## 2016-12-14 ENCOUNTER — Encounter: Payer: Self-pay | Admitting: Hematology & Oncology

## 2016-12-14 ENCOUNTER — Other Ambulatory Visit: Payer: Self-pay

## 2016-12-14 ENCOUNTER — Ambulatory Visit (HOSPITAL_BASED_OUTPATIENT_CLINIC_OR_DEPARTMENT_OTHER): Payer: Medicare Other | Admitting: Hematology & Oncology

## 2016-12-14 ENCOUNTER — Ambulatory Visit: Payer: Medicare Other

## 2016-12-14 ENCOUNTER — Other Ambulatory Visit (HOSPITAL_BASED_OUTPATIENT_CLINIC_OR_DEPARTMENT_OTHER): Payer: Medicare Other

## 2016-12-14 VITALS — BP 120/68 | HR 96 | Temp 98.3°F | Resp 18 | Wt 142.0 lb

## 2016-12-14 DIAGNOSIS — C7B8 Other secondary neuroendocrine tumors: Secondary | ICD-10-CM

## 2016-12-14 DIAGNOSIS — C7A1 Malignant poorly differentiated neuroendocrine tumors: Secondary | ICD-10-CM | POA: Diagnosis not present

## 2016-12-14 DIAGNOSIS — C7A8 Other malignant neuroendocrine tumors: Secondary | ICD-10-CM | POA: Insufficient documentation

## 2016-12-14 DIAGNOSIS — C61 Malignant neoplasm of prostate: Secondary | ICD-10-CM | POA: Diagnosis not present

## 2016-12-14 HISTORY — DX: Other malignant neuroendocrine tumors: C7A.8

## 2016-12-14 HISTORY — DX: Other secondary neuroendocrine tumors: C7B.8

## 2016-12-14 LAB — CBC WITH DIFFERENTIAL (CANCER CENTER ONLY)
BASO#: 0 10*3/uL (ref 0.0–0.2)
BASO%: 0.4 % (ref 0.0–2.0)
EOS%: 1.2 % (ref 0.0–7.0)
Eosinophils Absolute: 0.1 10*3/uL (ref 0.0–0.5)
HCT: 35.5 % — ABNORMAL LOW (ref 38.7–49.9)
HGB: 11.8 g/dL — ABNORMAL LOW (ref 13.0–17.1)
LYMPH#: 1.1 10*3/uL (ref 0.9–3.3)
LYMPH%: 12.6 % — ABNORMAL LOW (ref 14.0–48.0)
MCH: 31.6 pg (ref 28.0–33.4)
MCHC: 33.2 g/dL (ref 32.0–35.9)
MCV: 95 fL (ref 82–98)
MONO#: 0.6 10*3/uL (ref 0.1–0.9)
MONO%: 6.3 % (ref 0.0–13.0)
NEUT#: 7.2 10*3/uL — ABNORMAL HIGH (ref 1.5–6.5)
NEUT%: 79.5 % (ref 40.0–80.0)
Platelets: 335 10*3/uL (ref 145–400)
RBC: 3.73 10*6/uL — ABNORMAL LOW (ref 4.20–5.70)
RDW: 13.9 % (ref 11.1–15.7)
WBC: 9.1 10*3/uL (ref 4.0–10.0)

## 2016-12-14 LAB — CMP (CANCER CENTER ONLY)
ALT(SGPT): 23 U/L (ref 10–47)
AST: 21 U/L (ref 11–38)
Albumin: 3.5 g/dL (ref 3.3–5.5)
Alkaline Phosphatase: 48 U/L (ref 26–84)
BUN, Bld: 16 mg/dL (ref 7–22)
CO2: 30 mEq/L (ref 18–33)
Calcium: 10 mg/dL (ref 8.0–10.3)
Chloride: 103 mEq/L (ref 98–108)
Creat: 1.3 mg/dl — ABNORMAL HIGH (ref 0.6–1.2)
Glucose, Bld: 100 mg/dL (ref 73–118)
Potassium: 3.5 mEq/L (ref 3.3–4.7)
Sodium: 144 mEq/L (ref 128–145)
Total Bilirubin: 0.6 mg/dl (ref 0.20–1.60)
Total Protein: 6.9 g/dL (ref 6.4–8.1)

## 2016-12-14 LAB — LACTATE DEHYDROGENASE: LDH: 212 U/L (ref 125–245)

## 2016-12-14 MED FILL — VESIcare 5 MG TABS: 5 | 30 days supply | Qty: 30 | Fill #1

## 2016-12-14 NOTE — Addendum Note (Signed)
Addended by: Burney Gauze R on: 12/14/2016 05:18 PM   Modules accepted: Orders

## 2016-12-14 NOTE — Progress Notes (Signed)
Hematology and Oncology Follow Up Visit  [name] [id number] June 24, 1945 71 y.o. 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 is back for followup. He comes in with his wife and sister. He has a rolling walker. He had his back surgery successfully on August 29. However, the path report ([id number]) show metastatic carcinoma. The pathologist does not feel that this is prostate cancer.  I sent the pathologic specimen off to [facility] for genetic array. 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 They felt this was likely of lung origin.  He was not felt to be a candidate for SRS.  It was felt that he would be a candidate for Lutathera treatment.  I have put the order in previously.  I am not sure why radiology had not gotten this yet.  I will have to put another order in.  He has a orthotic on his right foot/lower leg.  This is for his foot drop.  Apparently there is issues with respect to the neurosurgeon signing off on this.  I will have to sign off on this so that insurance will cover this.  He had apparently a bad episode of diarrhea.  I am not sure why he had the diarrhea.  It sound like he had proctitis.  I am not sure what could have caused this.  He was put on Cipro and Flagyl.  He was also given Viberzi which has helped.  He has had no issues with cough or shortness of breath.  He has had no fever.  He has had no rashes.  He has had no leg swelling.  His appetite is coming back slowly.  He did have a nice  Thanksgiving.  Currently, his performance status is ECOG 1.   Medications:  Current Outpatient Medications:  .  acetaminophen (TYLENOL) 500 MG tablet, Take 1,000 mg by mouth every 4 (four) hours as needed for moderate pain., Disp: , Rfl:  .  aspirin 81 MG tablet, Take 81 mg by mouth daily., Disp: , Rfl:  .  Calcium Carbonate-Vitamin D (CALCIUM + D PO), Take 770 mg by mouth 2 (two) times daily. , Disp: , Rfl:  .  CALCIUM-IRON-VIT D-VIT K PO, Take 1 tablet by mouth 2 (two) times daily., Disp: , Rfl:  .  ciprofloxacin (CIPRO) 500 MG tablet, Take 1 tablet (500 mg total) 2 (two) times daily by mouth., Disp: 14 tablet, Rfl: 0 .  cyclobenzaprine (FLEXERIL) 10 MG tablet, Take 1 tablet (10 mg total) by mouth 3 (three) times daily as needed for muscle spasms. (Patient not taking: Reported on 11/21/2016), Disp: 50 tablet, Rfl: 0 .  dronabinol (MARINOL) 2.5 MG capsule, Take 1 capsule (2.5 mg total) by mouth 2 (two) times daily before lunch and supper. (Patient not taking: Reported on 11/21/2016), Disp: 60 capsule, Rfl: 0 .  Eluxadoline (VIBERZI) 75 MG TABS, Take 75 mg 2 (two) times daily by mouth., Disp: , Rfl:  .  gabapentin (NEURONTIN) 300 MG capsule, TAKE 1 CAPSULE BY MOUTH  TWICE A DAY THEN TAKE 2 CAPSULES AT BEDTIME, Disp: 120 capsule, Rfl: 4 .  HYDROmorphone (DILAUDID) 4 MG tablet, Take by mouth every 4 (four) hours as needed for severe pain., Disp: , Rfl:  .  isosorbide mononitrate (IMDUR) 30 MG 24 hr tablet, Take 30 mg by mouth daily., Disp: , Rfl:  .  lactulose, encephalopathy, (CHRONULAC) 10 GM/15ML SOLN, daily as needed. , Disp: , Rfl: 2 .  lidocaine (XYLOCAINE) 2 % jelly, Apply 1 application as needed topically., Disp: 30 mL, Rfl: 3 .  magic mouthwash w/lidocaine SOLN, Take 5 mLs by mouth 4 (four) times daily as needed for mouth pain., Disp: 500 mL, Rfl: 6 .  megestrol (MEGACE) 20 MG tablet, Take 1 tablet (20 mg total) by mouth daily., Disp: 30 tablet, Rfl: 3 .  metFORMIN (GLUCOPHAGE-XR) 500 MG 24  hr tablet, TAKE TWO TABLETS BY MOUTH DAILY WITH BREAKFAST, Disp: 180 tablet, Rfl: 2 .  metroNIDAZOLE (FLAGYL) 500 MG tablet, Take 1 tablet (500 mg total) 2 (two) times daily by mouth., Disp: 14 tablet, Rfl: 0 .  ondansetron (ZOFRAN ODT) 4 MG disintegrating tablet, Take 1 tablet (4 mg total) by mouth every 8 (eight) hours as needed for nausea or vomiting., Disp: 30 tablet, Rfl: 2 .  OVER THE COUNTER MEDICATION, every morning. Juice Plus -- 8 capsule of each the garden, vineyard, and orchead, Disp: , Rfl:  .  predniSONE (DELTASONE) 5 MG tablet, TAKE 1 TABLET BY MOUTH DAILY, Disp: 60 tablet, Rfl: 3 .  prochlorperazine (COMPAZINE) 10 MG tablet, Take 1 tablet (10 mg total) by mouth every 6 (six) hours as needed for nausea or vomiting., Disp: 30 tablet, Rfl: 0 .  traMADol (ULTRAM) 50 MG tablet, Take 1 tablet (50 mg total) by mouth every 6 (six) hours as needed., Disp: 90 tablet, Rfl: 2 .  UNABLE TO FIND, Med Name: Juice Plus Omega Blend, Disp: , Rfl:  .  UNABLE TO FIND, Med Name: Juice Plus [employer], Garden Blend, [employer], Disp: , Rfl:  .  VESICARE 5 MG tablet, TAKE 1 TABLET BY MOUTH DAILY. (Patient taking differently: TAKE 1 TABLET BY MOUTH TWICE A WEEK), Disp: 30 tablet, Rfl: 4 .  ZETIA 10 MG tablet, Take 10 mg by mouth daily. , Disp: , Rfl:   Allergies:  Allergies  Allergen Reactions  . Codeine Nausea And Vomiting  . Hydrocodone Nausea And Vomiting    Past Medical History, Surgical history, Social history, and Family History were reviewed and updated.  Review of Systems: As stated in the interim history  Physical Exam:  weight is 142 lb (64.4 kg). His oral temperature is 98.3 F (36.8 C). His blood pressure is 120/68 and his pulse is 96. His respiration is 18 and oxygen saturation is 100%.   I examined [name].  My findings are noted below with appropriate changes:   Well-developed and well-nourished white male. Head and neck exam shows no ocular or oral lesions. There are  no palpable cervical or supraclavicular lymph nodes. Lungs are clear bilaterally. Cardiac exam regular rate and rhythm with no murmurs, rubs or bruits. Abdomen is soft. He has good bowel sounds.  There is no guarding or rebound tenderness.  There is no fluid wave. There is no palpable liver or spleen tip. Back exam shows no tenderness over the spine, ribs or hips. Extremities shows that the orthotic on his right foot for his foot drop.  He has no swelling in his legs.  He has decent  range of motion of his joints.  Skin exam shows no rashes, ecchymoses or petechia.  Neurological exam shows the foot drop in the right foot.  Otherwise, there is no focal deficits.  Lab Results  Component Value Date   WBC 9.1 12/14/2016   HGB 11.8 (L) 12/14/2016   HCT 35.5 (L) 12/14/2016   MCV 95 12/14/2016   PLT 335 12/14/2016     Chemistry      Component Value Date/Time   NA 144 12/14/2016 1159   NA 140 10/22/2016 1129   K 3.5 12/14/2016 1159   K 4.3 10/22/2016 1129   CL 103 12/14/2016 1159   CO2 30 12/14/2016 1159   CO2 27 10/22/2016 1129   BUN 16 12/14/2016 1159   BUN 14.0 10/22/2016 1129   CREATININE 1.3 (H) 12/14/2016 1159   CREATININE 1.1 10/22/2016 1129      Component Value Date/Time   CALCIUM 10.0 12/14/2016 1159   CALCIUM 10.4 10/22/2016 1129   ALKPHOS 48 12/14/2016 1159   ALKPHOS 60 10/22/2016 1129   AST 21 12/14/2016 1159   AST 16 10/22/2016 1129   ALT 23 12/14/2016 1159   ALT 14 10/22/2016 1129   BILITOT 0.60 12/14/2016 1159   BILITOT 0.31 10/22/2016 1129         Impression and Plan: Mr. Corkum is 71 year old gentleman with metastatic prostate cancer.   Having this tumor be neuroendocrine definitely makes sense. This would explain why his PSA was always low and that he still had evidence of progressive disease.  Hopefully, radiology will be able to get to him regarding the [place] administration.  I think this would make a lot of sense.  He does not have a lot of metastatic  disease.  He is making some progress.  His walking is a little bit better.  I just feel bad that he has this unusual prostate cancer variant.  It is definitely neuroendocrine.  I would go ahead and plan to get him back in 6 more weeks.  Hopefully, by then, he will have had his Lutathera treatment.  We will see him back, he will get his Lupron and Xgeva injections    [name], MD 11/30/20181:12 PM

## 2016-12-15 LAB — TESTOSTERONE: Testosterone, Serum: <3 ng/dL — ABNORMAL LOW (ref 264–916)

## 2016-12-17 ENCOUNTER — Other Ambulatory Visit (HOSPITAL_COMMUNITY): Payer: Self-pay | Admitting: Diagnostic Radiology

## 2016-12-17 DIAGNOSIS — M545 Low back pain: Secondary | ICD-10-CM | POA: Diagnosis not present

## 2016-12-17 DIAGNOSIS — M6281 Muscle weakness (generalized): Secondary | ICD-10-CM | POA: Diagnosis not present

## 2016-12-17 DIAGNOSIS — C7A8 Other malignant neuroendocrine tumors: Secondary | ICD-10-CM

## 2016-12-17 DIAGNOSIS — M5417 Radiculopathy, lumbosacral region: Secondary | ICD-10-CM | POA: Diagnosis not present

## 2016-12-17 DIAGNOSIS — M4710 Other spondylosis with myelopathy, site unspecified: Secondary | ICD-10-CM | POA: Diagnosis not present

## 2016-12-17 LAB — CHROMOGRANIN A: Chromogranin A: 2 nmol/L (ref 0–5)

## 2016-12-19 DIAGNOSIS — M545 Low back pain: Secondary | ICD-10-CM | POA: Diagnosis not present

## 2016-12-19 DIAGNOSIS — M4710 Other spondylosis with myelopathy, site unspecified: Secondary | ICD-10-CM | POA: Diagnosis not present

## 2016-12-19 DIAGNOSIS — M5417 Radiculopathy, lumbosacral region: Secondary | ICD-10-CM | POA: Diagnosis not present

## 2016-12-19 DIAGNOSIS — M6281 Muscle weakness (generalized): Secondary | ICD-10-CM | POA: Diagnosis not present

## 2016-12-20 ENCOUNTER — Other Ambulatory Visit: Payer: Self-pay | Admitting: Hematology & Oncology

## 2016-12-20 DIAGNOSIS — M5417 Radiculopathy, lumbosacral region: Secondary | ICD-10-CM | POA: Diagnosis not present

## 2016-12-20 DIAGNOSIS — M545 Low back pain: Secondary | ICD-10-CM | POA: Diagnosis not present

## 2016-12-20 DIAGNOSIS — M79651 Pain in right thigh: Secondary | ICD-10-CM

## 2016-12-20 DIAGNOSIS — M4710 Other spondylosis with myelopathy, site unspecified: Secondary | ICD-10-CM | POA: Diagnosis not present

## 2016-12-20 DIAGNOSIS — M6281 Muscle weakness (generalized): Secondary | ICD-10-CM | POA: Diagnosis not present

## 2016-12-24 MED FILL — MEGESTROL 20 MG TABLET: 20 | 30 days supply | Qty: 30 | Fill #3

## 2016-12-24 MED FILL — GABAPENTIN 300 MG CAPSULE: 300 | 30 days supply | Qty: 120 | Fill #1

## 2016-12-25 DIAGNOSIS — C7951 Secondary malignant neoplasm of bone: Secondary | ICD-10-CM | POA: Diagnosis not present

## 2016-12-26 DIAGNOSIS — M5417 Radiculopathy, lumbosacral region: Secondary | ICD-10-CM | POA: Diagnosis not present

## 2016-12-26 DIAGNOSIS — M6281 Muscle weakness (generalized): Secondary | ICD-10-CM | POA: Diagnosis not present

## 2016-12-26 DIAGNOSIS — M545 Low back pain: Secondary | ICD-10-CM | POA: Diagnosis not present

## 2016-12-26 DIAGNOSIS — M4710 Other spondylosis with myelopathy, site unspecified: Secondary | ICD-10-CM | POA: Diagnosis not present

## 2016-12-27 DIAGNOSIS — M6281 Muscle weakness (generalized): Secondary | ICD-10-CM | POA: Diagnosis not present

## 2016-12-27 DIAGNOSIS — M4710 Other spondylosis with myelopathy, site unspecified: Secondary | ICD-10-CM | POA: Diagnosis not present

## 2016-12-27 DIAGNOSIS — M5417 Radiculopathy, lumbosacral region: Secondary | ICD-10-CM | POA: Diagnosis not present

## 2016-12-27 DIAGNOSIS — M545 Low back pain: Secondary | ICD-10-CM | POA: Diagnosis not present

## 2016-12-31 DIAGNOSIS — M4710 Other spondylosis with myelopathy, site unspecified: Secondary | ICD-10-CM | POA: Diagnosis not present

## 2016-12-31 DIAGNOSIS — M545 Low back pain: Secondary | ICD-10-CM | POA: Diagnosis not present

## 2016-12-31 DIAGNOSIS — M6281 Muscle weakness (generalized): Secondary | ICD-10-CM | POA: Diagnosis not present

## 2016-12-31 DIAGNOSIS — M5417 Radiculopathy, lumbosacral region: Secondary | ICD-10-CM | POA: Diagnosis not present

## 2017-01-01 ENCOUNTER — Ambulatory Visit (HOSPITAL_COMMUNITY)
Admission: RE | Admit: 2017-01-01 | Discharge: 2017-01-01 | Disposition: A | Discharge status: Home / Self Care | Payer: Medicare Other | Source: Ambulatory Visit | Attending: Diagnostic Radiology | Admitting: Diagnostic Radiology

## 2017-01-01 DIAGNOSIS — C7A8 Other malignant neuroendocrine tumors: Secondary | ICD-10-CM

## 2017-01-01 DIAGNOSIS — C7A1 Malignant poorly differentiated neuroendocrine tumors: Secondary | ICD-10-CM | POA: Diagnosis not present

## 2017-01-01 NOTE — Consult Note (Signed)
Chief Complaint: Patient was seen in consultation today for Peptide receptor radiotherapy (PRRT)  For metastatic neuroendocrine tumor  at the request of Lattie Haw, MD.  V  Referring Physician(s): Lattie Haw MD   Patient Status: [facility] - Out-pt  History of Present Illness: [name] is a very pleasant 71 year old male who presents for evaluation for PRRT Lu 177 DOTATATE radiotherapy for metastatic neuroendocrine tumor. Patient accompanied by white and sister.   Patient diagnosed with prostate cancer 2013 (Gleason 3+ 4 equals 7).  Patient subsequently developed pelvic sacral lesion which was treated with radiotherapy due to sciatic pain as well as biopsied. On biopsy, lesion was determined neuroendocrine, lung origin suspected. Patient additionally has bilateral pulmonary nodules which are enlarging. Nodules were avid for the neuroendocrine tumor specific radiotracer gallium 52 DOTATATE on  PET CT.    Patient is currently on maintenance prostate cancer therapy of Lupron and Xgeva.   In the past patient has had radium 223 therapy and sacral radiation therapy.   Patient complains of some symptoms which may be related to neuroendocrine tumor including diarrhea, flushing, and sweating.  Patient also reports some mild urinary incontinence for which he wears depends garments.  Patient not receiving Sandostatin injections.     Past Medical History:  Diagnosis Date  . Complication of anesthesia   . Constipation    due to pain medication  . 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 Dyspnea    with exertion  . Essential hypertension 07/12/2014  . High blood sugar   . Neuroendocrine carcinoma of unknown origin ([place]) 12/14/2016  . PONV (postoperative nausea and vomiting)   . Prostate cancer ([place]) 07/23/2011   with mets to sacral, lungs  . Radiation 04/22/14-05/06/14   right sacrum 30 gray  . Secondary neuroendocrine tumor of respiratory organs  ([place]) 12/14/2016    Past Surgical History:  Procedure Laterality Date  . CIRCUMCISION     when patient was 71 years old  . COLONOSCOPY    . COLONOSCOPY W/ POLYPECTOMY    . ESOPHAGOGASTRODUODENOSCOPY    . HAND SURGERY Left    fracture  . LUMBAR LAMINECTOMY/DECOMPRESSION MICRODISCECTOMY Right 09/12/2016   Procedure: LAMINECTOMY AND FORAMINOTOMY RIGHT LUMBAR FIVE- SACRAL ONE, SACRAL ONE- SACRAL TWO DECOMPRESSION OF RIGHT SACRAL ONE NERVE ROOT;  Surgeon: [name], MD;  Location: [place];  Service: Neurosurgery;  Laterality: Right;  . ROBOT ASSISTED LAPAROSCOPIC RADICAL PROSTATECTOMY    . TONSILECTOMY/ADENOIDECTOMY WITH MYRINGOTOMY      Allergies: Codeine and Hydrocodone  Medications: Prior to Admission medications   Medication Sig Start Date End Date Taking? Authorizing Provider  acetaminophen (TYLENOL) 500 MG tablet Take 1,000 mg by mouth every 4 (four) hours as needed for moderate pain.    [provider]  aspirin 81 MG tablet Take 81 mg by mouth daily.    [provider]  Calcium Carbonate-Vitamin D (CALCIUM + D PO) Take 770 mg by mouth 2 (two) times daily.     [provider]  CALCIUM-IRON-VIT D-VIT K PO Take 1 tablet by mouth 2 (two) times daily.    [provider]  ciprofloxacin (CIPRO) 500 MG tablet Take 1 tablet (500 mg total) 2 (two) times daily by mouth. 12/01/16   Isla Pence, MD  cyclobenzaprine (FLEXERIL) 10 MG tablet Take 1 tablet (10 mg total) by mouth 3 (three) times daily as needed for muscle spasms. Patient not taking: Reported on 11/21/2016 09/13/16   Arnoldo Morale,  Dellis Filbert, MD  dronabinol (MARINOL) 2.5 MG capsule Take 1 capsule (2.5 mg total) by mouth 2 (two) times daily before lunch and supper. Patient not taking: Reported on 11/21/2016 10/12/16   [name], MD  Eluxadoline (VIBERZI) 75 MG TABS Take 75 mg 2 (two) times daily by mouth.    [provider]  gabapentin (NEURONTIN) 300 MG capsule TAKE 1 CAPSULE BY MOUTH  TWICE A DAY THEN TAKE 2 CAPSULES AT BEDTIME 11/30/16   [place], Holli Humbles, NP  HYDROmorphone (DILAUDID) 4 MG tablet Take by mouth every 4 (four) hours as needed for severe pain.    [provider]  isosorbide mononitrate (IMDUR) 30 MG 24 hr tablet Take 30 mg by mouth daily.    [provider]  lactulose, encephalopathy, (CHRONULAC) 10 GM/15ML SOLN daily as needed.  09/10/16   [provider]  lidocaine (XYLOCAINE) 2 % jelly Apply 1 application as needed topically. 11/30/16   [place], Holli Humbles, NP  magic mouthwash w/lidocaine SOLN Take 5 mLs by mouth 4 (four) times daily as needed for mouth pain. 08/31/16   [name], MD  megestrol (MEGACE) 20 MG tablet Take 1 tablet (20 mg total) by mouth daily. 10/12/16   [name], MD  metFORMIN (GLUCOPHAGE-XR) 500 MG 24 hr tablet TAKE TWO TABLETS BY MOUTH DAILY WITH BREAKFAST 08/03/16   [name], MD  metroNIDAZOLE (FLAGYL) 500 MG tablet Take 1 tablet (500 mg total) 2 (two) times daily by mouth. 12/01/16   Isla Pence, MD  ondansetron (ZOFRAN ODT) 4 MG disintegrating tablet Take 1 tablet (4 mg total) by mouth every 8 (eight) hours as needed for nausea or vomiting. 08/17/16   [name], MD  OVER THE COUNTER MEDICATION every morning. Juice Plus -- 8 capsule of each the garden, vineyard, and Publishing copy, Historical, MD  predniSONE (DELTASONE) 5 MG tablet TAKE 1 TABLET BY MOUTH DAILY 08/03/16   [name], MD  prochlorperazine (COMPAZINE) 10 MG tablet Take 1 tablet (10 mg total) by mouth every 6 (six) hours as needed for nausea or vomiting. 09/22/15   [name], MD  traMADol (ULTRAM) 50 MG tablet TAKE 1 TABLET BY MOUTH EVERY 6 HOURS AS NEEDED 12/20/16   [name], MD  UNABLE TO FIND Med Name: Juice Plus Omega Blend    [provider]  UNABLE TO FIND Med Name: Juice Plus [address] Garden Blend, [employer], Historical, MD  VESICARE 5 MG tablet TAKE 1 TABLET BY  MOUTH DAILY. Patient taking differently: TAKE 1 TABLET BY MOUTH TWICE A WEEK 07/16/16   [place], Rudell Cobb, MD  ZETIA 10 MG tablet Take 10 mg by mouth daily.  06/29/14   [provider]     Family History  Problem Relation Age of Onset  . Prostate cancer Father   . Heart disease Father   . Heart disease Mother     Social History   Socioeconomic History  . Marital status: Married    Spouse name: Not on file  . Number of children: 2  . Years of education: Not on file  . Highest education level: Not on file  Social Needs  . Financial resource strain: Not on file  . Food insecurity - worry: Not on file  . Food insecurity - inability: Not on file  . Transportation needs - medical: Not on file  . Transportation needs - non-medical: Not on file  Occupational History  .  Occupation: real estate aprais.    Employer: [employer]  Tobacco Use  . Smoking status: Never Smoker  . Smokeless tobacco: Never Used  . Tobacco comment: never used tobacco  Substance and Sexual Activity  . Alcohol use: No    Alcohol/week: 0.0 oz  . Drug use: No  . Sexual activity: Not on file  Other Topics Concern  . Not on file  Social History Narrative  . Not on file    ECOG Status: 1 - Symptomatic but completely ambulatory - ambulates with rolling walker  Review of Systems: A 12 point ROS discussed and pertinent positives are indicated in the HPI above.  All other systems are negative.  Review of Systems  Constitutional: Positive for diaphoresis and unexpected weight change.  Gastrointestinal: Positive for diarrhea.  Endocrine: Positive for heat intolerance.  Genitourinary:       Mild urinary incontinence  Musculoskeletal: Positive for back pain and gait problem.    Vital Signs: There were no vitals taken for this visit.  Physical Exam  Constitutional: He is oriented to person, place, and time. He appears well-developed and well-nourished.  HENT:  Head: Atraumatic.  Eyes:  Pupils are equal, round, and reactive to light.  Neck: Neck supple.  Cardiovascular: Normal rate, regular rhythm and normal heart sounds.  Pulmonary/Chest: Effort normal and breath sounds normal.  Abdominal: Soft. Bowel sounds are normal.  Neurological: He is alert and oriented to person, place, and time.  Skin: Skin is warm. Capillary refill takes less than 2 seconds.    Imaging: Nm Pet (netspot Ga 68 Dotatate) Skull Base To Mid Thigh  Result Date: 11/06/2016 EXAM: NUCLEAR MEDICINE PET SKULL BASE TO THIGH TECHNIQUE: 5.7 mCi gallium 68 DOTATATE was injected intravenously. Full-ring PET imaging was performed from the skull base to thigh after the radiotracer. CT data was obtained and used for attenuation correction and anatomic localization. FASTING BLOOD GLUCOSE:  FASTING BLOOD GLUCOSE Not applicable COMPARISON:  Fluciclovine PET scan 322 18. FINDINGS: NECK No abnormal radiotracer accumulation in the neck CHEST Bilateral pulmonary nodules are again demonstrated. These nodules have associated radiotracer activity and mildly increased in size. For example nodule in the RIGHT azygos esophageal recess measures 16 mm (image 90, series 4) increased from 14 mm with SUV max equal 3.2. Nodule in the RIGHT lower lobe over the RIGHT hemidiaphragm measures 11 mm (image 83, series 4) compared with 9 mm in with SUV max equal 1.8. Nodule noted also in the LEFT lung lingula (image 88, series 4). Some smaller nodules in the upper lobes not have a clear radiotracer activity. No hypermetabolic nodes. ABDOMEN/PELVIS No abnormal radiotracer in the liver. No focal activity within the bowel. No hypermetabolic abdominopelvic lymph nodes. Physiologic activity noted within the adrenal glands spleen and kidneys. Post prostatectomy.  No abnormality in the prostate bed. SKELETON No focal tracer activity to suggest neuroendocrine skeletal metastasis. Sclerotic lesion in the RIGHT sacral ala unchanged from comparison exam.  IMPRESSION: 1. 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 2. No evidence of metastatic disease in the abdomen or pelvis. 3. No primary lesion identified. 4. No evidence skeletal metastatic neuroendocrine tumor. 5. Stable sclerotic lesion in the RIGHT sacrum. Electronically Signed   By: Suzy Bouchard M.D.   On: 11/06/2016 15:04     Labs:  CBC: Recent Labs    10/22/16 1129 11/12/16 1123 12/01/16 1138 12/14/16 1159  WBC 7.8 7.4 8.0 9.1  HGB 13.0 12.7* 12.2* 11.8*  HCT 39.4 38.3* 37.7* 35.5*  PLT 346 294 316 335    COAGS: No results for input(s): INR, APTT in the last 8760 hours.  BMP: Recent Labs    04/25/16 1355  09/07/16 1049  10/12/16 1026 10/22/16 1129 11/12/16 1123 12/01/16 1138 12/14/16 1159  NA 141   < > 138   < > 138 140 143 139 144  K 4.5   < > 3.9   < > 4.6 4.3 3.8 3.5 3.5  CL 99   < > 98*   < > 92*  --  102 104 103  CO2 29   < > 28   < > 31 27 30 25 30   GLUCOSE 144*   < > 92   < > 148* 94 96 107* 100  BUN 15   < > 14   < > 14 14.0 14 17 16   CALCIUM 9.7   < > 10.2   < > 10.3 10.4 10.4* 9.8 10.0  CREATININE 1.18   < > 1.15   < > 1.0 1.1 1.2 1.04 1.3*  GFRNONAA 63  --  >60  --   --   --   --  >60  --   GFRAA 73  --  >60  --   --   --   --  >60  --    < > = values in this interval not displayed.    LIVER FUNCTION TESTS: Recent Labs    10/22/16 1129 11/12/16 1123 12/01/16 1138 12/14/16 1159  BILITOT 0.31 0.50 0.5 0.60  AST 16 21 21 21   ALT 14 26 14* 23  ALKPHOS 60 57 59 48  PROT 7.2 7.3 7.3 6.9  ALBUMIN 3.9 3.5 3.8 3.5    TUMOR MARKERS: Recent Labs    10/22/16 1129 11/12/16 1123 12/14/16 1159  CHROMOGRNA 2 2 2     Assessment and Plan: Patient is a good candidate for Lu 177 DOTOTATE therapy. Patient has enlarging pulmonary lesions which are avid for the Ga 68 DOTATATE radiotracer and will be targeted with the Lu  DOTATATE treatment.  Patient scheduled for January 9.  We will draw CBC and CMP that that morning. Patient to follow up with Dr. Marin Olp, oncologist in 4 week after therapy to evaluate lab values toensure no myelosuppression, liver toxicity  or renal toxicity. (CBC and CMP 4 weeks after therapy)     Patient meets the exclusion criteria with normal Blood counts and renal function and liver function will be followed closely during therapy.  Thank you for this interesting consult.  I greatly enjoyed meeting [name] and look forward to participating in their care.  A copy of this report was sent to the requesting provider on this date.  Electronically Signed: Rennis Golden, MD 01/01/2017, 1:48 PM   I spent a total of  30 Minutes   in face to face in clinical consultation, greater than 50% of which was counseling/coordinating care for metatatic neurendocrine tumore

## 2017-01-02 DIAGNOSIS — M6281 Muscle weakness (generalized): Secondary | ICD-10-CM | POA: Diagnosis not present

## 2017-01-02 DIAGNOSIS — M4710 Other spondylosis with myelopathy, site unspecified: Secondary | ICD-10-CM | POA: Diagnosis not present

## 2017-01-02 DIAGNOSIS — M5417 Radiculopathy, lumbosacral region: Secondary | ICD-10-CM | POA: Diagnosis not present

## 2017-01-02 DIAGNOSIS — M545 Low back pain: Secondary | ICD-10-CM | POA: Diagnosis not present

## 2017-01-09 DIAGNOSIS — M5417 Radiculopathy, lumbosacral region: Secondary | ICD-10-CM | POA: Diagnosis not present

## 2017-01-09 DIAGNOSIS — M545 Low back pain: Secondary | ICD-10-CM | POA: Diagnosis not present

## 2017-01-09 DIAGNOSIS — M6281 Muscle weakness (generalized): Secondary | ICD-10-CM | POA: Diagnosis not present

## 2017-01-09 DIAGNOSIS — M4710 Other spondylosis with myelopathy, site unspecified: Secondary | ICD-10-CM | POA: Diagnosis not present

## 2017-01-10 DIAGNOSIS — M6281 Muscle weakness (generalized): Secondary | ICD-10-CM | POA: Diagnosis not present

## 2017-01-10 DIAGNOSIS — M5417 Radiculopathy, lumbosacral region: Secondary | ICD-10-CM | POA: Diagnosis not present

## 2017-01-10 DIAGNOSIS — M545 Low back pain: Secondary | ICD-10-CM | POA: Diagnosis not present

## 2017-01-10 DIAGNOSIS — M4710 Other spondylosis with myelopathy, site unspecified: Secondary | ICD-10-CM | POA: Diagnosis not present

## 2017-01-14 DIAGNOSIS — M6281 Muscle weakness (generalized): Secondary | ICD-10-CM | POA: Diagnosis not present

## 2017-01-14 DIAGNOSIS — M4710 Other spondylosis with myelopathy, site unspecified: Secondary | ICD-10-CM | POA: Diagnosis not present

## 2017-01-14 DIAGNOSIS — M545 Low back pain: Secondary | ICD-10-CM | POA: Diagnosis not present

## 2017-01-14 DIAGNOSIS — M5417 Radiculopathy, lumbosacral region: Secondary | ICD-10-CM | POA: Diagnosis not present

## 2017-01-16 ENCOUNTER — Encounter (HOSPITAL_COMMUNITY): Payer: Self-pay

## 2017-01-17 DIAGNOSIS — M4710 Other spondylosis with myelopathy, site unspecified: Secondary | ICD-10-CM | POA: Diagnosis not present

## 2017-01-17 DIAGNOSIS — M6281 Muscle weakness (generalized): Secondary | ICD-10-CM | POA: Diagnosis not present

## 2017-01-17 DIAGNOSIS — M5417 Radiculopathy, lumbosacral region: Secondary | ICD-10-CM | POA: Diagnosis not present

## 2017-01-17 DIAGNOSIS — M545 Low back pain: Secondary | ICD-10-CM | POA: Diagnosis not present

## 2017-01-21 DIAGNOSIS — M545 Low back pain: Secondary | ICD-10-CM | POA: Diagnosis not present

## 2017-01-21 DIAGNOSIS — M6281 Muscle weakness (generalized): Secondary | ICD-10-CM | POA: Diagnosis not present

## 2017-01-21 DIAGNOSIS — M5417 Radiculopathy, lumbosacral region: Secondary | ICD-10-CM | POA: Diagnosis not present

## 2017-01-21 DIAGNOSIS — M4710 Other spondylosis with myelopathy, site unspecified: Secondary | ICD-10-CM | POA: Diagnosis not present

## 2017-01-22 DIAGNOSIS — M6281 Muscle weakness (generalized): Secondary | ICD-10-CM | POA: Diagnosis not present

## 2017-01-22 DIAGNOSIS — M4710 Other spondylosis with myelopathy, site unspecified: Secondary | ICD-10-CM | POA: Diagnosis not present

## 2017-01-22 DIAGNOSIS — M5417 Radiculopathy, lumbosacral region: Secondary | ICD-10-CM | POA: Diagnosis not present

## 2017-01-22 DIAGNOSIS — M545 Low back pain: Secondary | ICD-10-CM | POA: Diagnosis not present

## 2017-01-23 ENCOUNTER — Ambulatory Visit (HOSPITAL_COMMUNITY)
Admission: RE | Admit: 2017-01-23 | Discharge: 2017-01-23 | Disposition: A | Discharge status: Home / Self Care | Payer: Medicare Other | Source: Ambulatory Visit | Attending: Hematology & Oncology | Admitting: Hematology & Oncology

## 2017-01-23 ENCOUNTER — Other Ambulatory Visit (HOSPITAL_COMMUNITY): Payer: Medicare Other

## 2017-01-23 ENCOUNTER — Other Ambulatory Visit: Payer: Self-pay | Admitting: Hematology & Oncology

## 2017-01-23 ENCOUNTER — Inpatient Hospital Stay: Payer: Medicare Other | Attending: Hematology & Oncology

## 2017-01-23 DIAGNOSIS — Z5111 Encounter for antineoplastic chemotherapy: Secondary | ICD-10-CM | POA: Diagnosis not present

## 2017-01-23 DIAGNOSIS — C61 Malignant neoplasm of prostate: Secondary | ICD-10-CM | POA: Insufficient documentation

## 2017-01-23 DIAGNOSIS — C7951 Secondary malignant neoplasm of bone: Secondary | ICD-10-CM | POA: Diagnosis not present

## 2017-01-23 DIAGNOSIS — C7B8 Other secondary neuroendocrine tumors: Principal | ICD-10-CM

## 2017-01-23 DIAGNOSIS — C7A8 Other malignant neuroendocrine tumors: Secondary | ICD-10-CM

## 2017-01-23 DIAGNOSIS — Z79899 Other long term (current) drug therapy: Secondary | ICD-10-CM | POA: Insufficient documentation

## 2017-01-23 DIAGNOSIS — Z7982 Long term (current) use of aspirin: Secondary | ICD-10-CM | POA: Insufficient documentation

## 2017-01-23 DIAGNOSIS — C7A1 Malignant poorly differentiated neuroendocrine tumors: Secondary | ICD-10-CM | POA: Diagnosis not present

## 2017-01-23 LAB — CBC WITH DIFFERENTIAL/PLATELET
Basophils Absolute: 0 10*3/uL (ref 0.0–0.1)
Basophils Relative: 0 %
Eosinophils Absolute: 0.1 10*3/uL (ref 0.0–0.7)
Eosinophils Relative: 1 %
HCT: 38.5 % — ABNORMAL LOW (ref 39.0–52.0)
Hemoglobin: 12.7 g/dL — ABNORMAL LOW (ref 13.0–17.0)
Lymphocytes Relative: 19 %
Lymphs Abs: 1.4 10*3/uL (ref 0.7–4.0)
MCH: 31.6 pg (ref 26.0–34.0)
MCHC: 33 g/dL (ref 30.0–36.0)
MCV: 95.8 fL (ref 78.0–100.0)
Monocytes Absolute: 0.6 10*3/uL (ref 0.1–1.0)
Monocytes Relative: 8 %
Neutro Abs: 5.3 10*3/uL (ref 1.7–7.7)
Neutrophils Relative %: 72 %
Platelets: 301 10*3/uL (ref 150–400)
RBC: 4.02 MIL/uL — ABNORMAL LOW (ref 4.22–5.81)
RDW: 14.1 % (ref 11.5–15.5)
WBC: 7.4 10*3/uL (ref 4.0–10.5)

## 2017-01-23 LAB — COMPREHENSIVE METABOLIC PANEL
ALT: 18 U/L (ref 17–63)
AST: 18 U/L (ref 15–41)
Albumin: 4.2 g/dL (ref 3.5–5.0)
Alkaline Phosphatase: 56 U/L (ref 38–126)
Anion gap: 7 (ref 5–15)
BUN: 18 mg/dL (ref 6–20)
CO2: 25 mmol/L (ref 22–32)
Calcium: 9.3 mg/dL (ref 8.9–10.3)
Chloride: 106 mmol/L (ref 101–111)
Creatinine, Ser: 1.13 mg/dL (ref 0.61–1.24)
GFR calc Af Amer: >60 mL/min (ref >60)
GFR calc non Af Amer: >60 mL/min (ref >60)
Glucose, Bld: 76 mg/dL (ref 65–99)
Potassium: 3.7 mmol/L (ref 3.5–5.1)
Sodium: 138 mmol/L (ref 135–145)
Total Bilirubin: 0.6 mg/dL (ref 0.3–1.2)
Total Protein: 7.2 g/dL (ref 6.5–8.1)

## 2017-01-23 MED ORDER — SODIUM CHLORIDE 0.9 % IV SOLN: 500.0000 mL | Freq: Once | INTRAVENOUS | Status: DC

## 2017-01-23 MED ORDER — AMINO ACID RADIOPROTECTANT - L-LYSINE 2.5%/L-ARGININE 2.5% IN NS
250.0000 mL/h | INTRAVENOUS | Status: AC
  Filled 2017-01-23: qty 1000

## 2017-01-23 MED ORDER — DENOSUMAB 120 MG/1.7ML ~~LOC~~ SOLN: 120.0000 mg | Freq: Once | SUBCUTANEOUS | Status: DC

## 2017-01-23 MED ORDER — SODIUM CHLORIDE 0.9 % IV SOLN
8.0000 mg | Freq: Once | INTRAVENOUS | Status: DC
  Filled 2017-01-23: qty 4

## 2017-01-23 MED ORDER — LEUPROLIDE ACETATE (3 MONTH) 22.5 MG IM KIT
22.5000 mg | PACK | Freq: Once | INTRAMUSCULAR | Status: DC
  Filled 2017-01-23: qty 22.5

## 2017-01-23 MED ORDER — LUTETIUM LU 177 DOTATATE 370 MBQ/ML IV SOLN: 200.0000 | Freq: Once | INTRAVENOUS | Status: DC

## 2017-01-23 MED ORDER — OCTREOTIDE ACETATE 30 MG IM KIT
30.0000 mg | PACK | Freq: Once | INTRAMUSCULAR | Status: AC
  Filled 2017-01-23: qty 1

## 2017-01-23 MED ORDER — SODIUM CHLORIDE 0.9 % IV SOLN
8.0000 mg | Freq: Once | INTRAVENOUS | Status: AC
  Administered 2017-01-23: 8 mg via INTRAVENOUS
  Filled 2017-01-23: qty 4

## 2017-01-23 MED ORDER — AMINO ACID RADIOPROTECTANT - L-LYSINE 2.5%/L-ARGININE 2.5% IN NS
250.0000 mL/h | INTRAVENOUS | Status: AC
  Administered 2017-01-23: 250 mL/h via INTRAVENOUS
  Filled 2017-01-23: qty 1000

## 2017-01-23 MED ORDER — OCTREOTIDE ACETATE 30 MG IM KIT: 30.0000 mg | PACK | Freq: Once | INTRAMUSCULAR | Status: DC

## 2017-01-23 MED ORDER — LUTETIUM LU 177 DOTATATE 370 MBQ/ML IV SOLN
200.0000 | Freq: Once | INTRAVENOUS | Status: AC
  Administered 2017-01-23: 200 via INTRAVENOUS

## 2017-01-23 MED ORDER — OCTREOTIDE ACETATE 30 MG IM KIT
30.0000 mg | PACK | Freq: Once | INTRAMUSCULAR | Status: AC
  Administered 2017-01-23: 30 mg via INTRAMUSCULAR

## 2017-01-23 NOTE — Progress Notes (Signed)
Patient received first of four Lutathera Peptide receptor radiotherapy - PRRT. Patient tolerated the procedure well with no nausea or vomiting related to the amino acid infusion. Patient received post therapy Sandostatin IM injection same day at the outpatient injection center. Patient will return for follow-up  and laboratory assessment (CBC and CMP) with Dr. Marin Olp in 2-4 weeks. Patient will return in the 8 to 10 weeks to [place]. for next PRRT.   Of note patient was hypotensive on arrival and on vital monitoring during procedure.  Blood pressure normalized w 1 Liter  AA infusion.   Full  treatment note in Radiology PACs.

## 2017-01-25 ENCOUNTER — Inpatient Hospital Stay: Payer: Medicare Other

## 2017-01-25 ENCOUNTER — Other Ambulatory Visit: Payer: Self-pay

## 2017-01-25 ENCOUNTER — Encounter: Payer: Self-pay | Admitting: Hematology & Oncology

## 2017-01-25 ENCOUNTER — Inpatient Hospital Stay (HOSPITAL_BASED_OUTPATIENT_CLINIC_OR_DEPARTMENT_OTHER): Payer: Medicare Other | Admitting: Hematology & Oncology

## 2017-01-25 VITALS — BP 116/62 | HR 81 | Temp 98.4°F | Resp 18 | Wt 140.0 lb

## 2017-01-25 DIAGNOSIS — Z5111 Encounter for antineoplastic chemotherapy: Secondary | ICD-10-CM | POA: Diagnosis not present

## 2017-01-25 DIAGNOSIS — C7A1 Malignant poorly differentiated neuroendocrine tumors: Secondary | ICD-10-CM

## 2017-01-25 DIAGNOSIS — C61 Malignant neoplasm of prostate: Secondary | ICD-10-CM | POA: Diagnosis not present

## 2017-01-25 DIAGNOSIS — Z7982 Long term (current) use of aspirin: Secondary | ICD-10-CM | POA: Diagnosis not present

## 2017-01-25 DIAGNOSIS — C7A8 Other malignant neuroendocrine tumors: Secondary | ICD-10-CM

## 2017-01-25 DIAGNOSIS — Z79899 Other long term (current) drug therapy: Secondary | ICD-10-CM | POA: Diagnosis not present

## 2017-01-25 DIAGNOSIS — C7951 Secondary malignant neoplasm of bone: Secondary | ICD-10-CM | POA: Diagnosis not present

## 2017-01-25 LAB — CBC WITH DIFFERENTIAL (CANCER CENTER ONLY)
Basophils Absolute: 0 10*3/uL (ref 0.0–0.1)
Basophils Relative: 1 %
Eosinophils Absolute: 0.2 10*3/uL (ref 0.0–0.5)
Eosinophils Relative: 2 %
HCT: 39.1 % (ref 38.7–49.9)
Hemoglobin: 12.9 g/dL — ABNORMAL LOW (ref 13.0–17.1)
Lymphocytes Relative: 15 %
Lymphs Abs: 1.2 10*3/uL (ref 0.9–3.3)
MCH: 32.3 pg (ref 28.0–33.4)
MCHC: 33 g/dL (ref 32.0–35.9)
MCV: 97.8 fL (ref 82.0–98.0)
Monocytes Absolute: 0.7 10*3/uL (ref 0.1–0.9)
Monocytes Relative: 8 %
Neutro Abs: 6.1 10*3/uL (ref 1.5–6.5)
Neutrophils Relative %: 74 %
Platelet Count: 282 10*3/uL (ref 140–400)
RBC: 4 MIL/uL — ABNORMAL LOW (ref 4.20–5.70)
RDW: 13.4 % (ref 11.1–15.7)
WBC Count: 8.1 10*3/uL (ref 4.0–10.3)

## 2017-01-25 LAB — CMP (CANCER CENTER ONLY)
ALT: 18 U/L (ref 0–55)
AST: 18 U/L (ref 5–34)
Albumin: 3.7 g/dL (ref 3.5–5.0)
Alkaline Phosphatase: 59 U/L (ref 40–150)
Anion gap: 16 — ABNORMAL HIGH (ref 5–15)
BUN: 13 mg/dL (ref 7–26)
CO2: 23 mmol/L (ref 22–29)
Calcium: 9.4 mg/dL (ref 8.4–10.4)
Chloride: 106 mmol/L (ref 98–109)
Creatinine: 1 mg/dL (ref 0.70–1.30)
GFR, Est AFR Am: >60 mL/min (ref >60)
GFR, Estimated: >60 mL/min (ref >60)
Glucose, Bld: 67 mg/dL — ABNORMAL LOW (ref 70–140)
Potassium: 3.8 mmol/L (ref 3.5–5.1)
Sodium: 145 mmol/L (ref 136–145)
Total Bilirubin: 0.7 mg/dL (ref 0.2–1.2)
Total Protein: 7.2 g/dL (ref 6.4–8.3)

## 2017-01-25 MED ORDER — DENOSUMAB 120 MG/1.7ML ~~LOC~~ SOLN
120.0000 mg | Freq: Once | SUBCUTANEOUS | Status: AC
  Administered 2017-01-25: 120 mg via SUBCUTANEOUS

## 2017-01-25 MED ORDER — LEUPROLIDE ACETATE (3 MONTH) 22.5 MG IM KIT
22.5000 mg | PACK | Freq: Once | INTRAMUSCULAR | Status: AC
  Administered 2017-01-25: 22.5 mg via INTRAMUSCULAR
  Filled 2017-01-25: qty 22.5

## 2017-01-25 MED FILL — GABAPENTIN 300 MG CAPSULE: 300 | 30 days supply | Qty: 120 | Fill #2

## 2017-01-25 MED FILL — VESIcare 5 MG TABS: 5 | 30 days supply | Qty: 30 | Fill #2

## 2017-01-25 MED FILL — ISOSORBIDE MN ER 30 MG TAB: 30 | 90 days supply | Qty: 90 | Fill #3

## 2017-01-25 NOTE — Progress Notes (Signed)
Hematology and Oncology Follow Up Visit  [name] [id number] 07/29/1945 72 y.o. 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 is back for followup.  Every time I see him, he looks a little bit better.  He is not having any pain.  He is still doing some physical therapy for his right leg.  He is going for an MRI of the lower back in Wednesday of next week.  We did do genetic testing on his tumor.  Unfortunately, there were no actionable mutations.  He was negative for PD-L1.  He had a proficient MMR.  He was negative for MSI.  He had a low TMB.  He did get Lutathera.  He got his first injection earlier this week.  He did well with this.  He has had no cough.  There is no shortness of breath.  He has had no change in bowel or bladder habits.  His blood sugars have been doing pretty well.  He has had some slightly decreased blood pressure.  This is been managed by his cardiologist.  I told him to take the Imdur every other day.  He did have a nice Thanksgiving, Christmas and New Year's.  Currently, his performance status is ECOG 1.   Medications:  Current Outpatient Medications:  .  Lutetium Lu 177 Dotatate (LUTATHERA IV), Inject into the vein., Disp: , Rfl:  .  acetaminophen (TYLENOL) 500 MG tablet, Take 1,000 mg by mouth every 4 (four) hours as needed for moderate pain., Disp: , Rfl:  .  aspirin 81 MG tablet, Take 81 mg by mouth daily., Disp: , Rfl:  .  Calcium  Carbonate-Vitamin D (CALCIUM + D PO), Take 770 mg by mouth 2 (two) times daily. , Disp: , Rfl:  .  CALCIUM-IRON-VIT D-VIT K PO, Take 1 tablet by mouth 2 (two) times daily., Disp: , Rfl:  .  dronabinol (MARINOL) 2.5 MG capsule, Take 1 capsule (2.5 mg total) by mouth 2 (two) times daily before lunch and supper. (Patient not taking: Reported on 11/21/2016), Disp: 60 capsule, Rfl: 0 .  Eluxadoline (VIBERZI) 75 MG TABS, Take 75 mg 2 (two) times daily by mouth., Disp: , Rfl:  .  gabapentin (NEURONTIN) 300 MG capsule, TAKE 1 CAPSULE BY MOUTH TWICE A DAY THEN TAKE 2 CAPSULES AT BEDTIME, Disp: 120 capsule, Rfl: 4 .  HYDROmorphone (DILAUDID) 4 MG tablet, Take by mouth every 4 (four) hours as needed for severe pain., Disp: , Rfl:  .  isosorbide mononitrate (IMDUR) 30 MG 24 hr tablet, Take 30 mg by mouth daily., Disp: , Rfl:  .  megestrol (MEGACE) 20 MG tablet, Take 1 tablet (20 mg total) by mouth daily., Disp: 30 tablet, Rfl: 3 .  metFORMIN (GLUCOPHAGE-XR) 500 MG 24 hr tablet, TAKE TWO TABLETS BY MOUTH DAILY WITH BREAKFAST, Disp: 180 tablet, Rfl: 2 .  ondansetron (ZOFRAN ODT) 4 MG disintegrating tablet, Take 1 tablet (4 mg  total) by mouth every 8 (eight) hours as needed for nausea or vomiting., Disp: 30 tablet, Rfl: 2 .  OVER THE COUNTER MEDICATION, every morning. Juice Plus -- 8 capsule of each the garden, vineyard, and orchead, Disp: , Rfl:  .  predniSONE (DELTASONE) 5 MG tablet, TAKE 1 TABLET BY MOUTH DAILY, Disp: 60 tablet, Rfl: 3 .  prochlorperazine (COMPAZINE) 10 MG tablet, Take 1 tablet (10 mg total) by mouth every 6 (six) hours as needed for nausea or vomiting., Disp: 30 tablet, Rfl: 0 .  traMADol (ULTRAM) 50 MG tablet, TAKE 1 TABLET BY MOUTH EVERY 6 HOURS AS NEEDED, Disp: 90 tablet, Rfl: 2 .  UNABLE TO FIND, Med Name: Juice Plus Omega Blend, Disp: , Rfl:  .  UNABLE TO FIND, Med Name: Juice Plus [employer], Garden Blend, [employer], Disp: , Rfl:  .  VESICARE 5 MG tablet, TAKE 1 TABLET BY MOUTH  DAILY. (Patient taking differently: TAKE 1 TABLET BY MOUTH TWICE A WEEK), Disp: 30 tablet, Rfl: 4 .  ZETIA 10 MG tablet, Take 10 mg by mouth daily. , Disp: , Rfl:  No current facility-administered medications for this visit.   Facility-Administered Medications Ordered in Other Visits:  .  0.9 %  sodium chloride infusion, 500 mL, Intravenous, Once, Edmunds, J, MD .  0.9 %  sodium chloride infusion, 500 mL, Intravenous, Once, Leonia Reeves, J, MD .  lutetium Lu 177 dotatate (LUTATHERA) 810 MBQ/ML 175 millicurie, 102 millicurie, Intravenous, Once, Edmunds, J, MD .  octreotide (SANDOSTATIN LAR) IM injection 30 mg, 30 mg, Intramuscular, Once, Malikye Reppond, Rudell Cobb, MD .  ondansetron (ZOFRAN) 8 mg in sodium chloride 0.9 % 50 mL IVPB, 8 mg, Intravenous, Once, Gus Height, MD  Allergies:  Allergies  Allergen Reactions  . Codeine Nausea And Vomiting  . Hydrocodone Nausea And Vomiting    Past Medical History, Surgical history, Social history, and Family History were reviewed and updated.  Review of Systems: Review of Systems  All other systems reviewed and are negative.   Physical Exam:  weight is 140 lb (63.5 kg). His oral temperature is 98.4 F (36.9 C). His blood pressure is 116/62 and his pulse is 81. His respiration is 18 and oxygen saturation is 100%.   Physical Exam  Constitutional: He is oriented to person, place, and time.  HENT:  Head: Normocephalic and atraumatic.  Mouth/Throat: Oropharynx is clear and moist.  Eyes: EOM are normal. Pupils are equal, round, and reactive to light.  Neck: Normal range of motion.  Cardiovascular: Normal rate, regular rhythm and normal heart sounds.  Pulmonary/Chest: Effort normal and breath sounds normal.  Abdominal: Soft. Bowel sounds are normal.  Musculoskeletal: Normal range of motion. He exhibits no edema, tenderness or deformity.  Lymphadenopathy:    He has no cervical adenopathy.  Neurological: He is alert and oriented to person, place, and time.    Skin: Skin is warm and dry. No rash noted. No erythema.  Psychiatric: He has a normal mood and affect. His behavior is normal. Judgment and thought content normal.  Vitals reviewed.    Lab Results  Component Value Date   WBC 7.4 01/23/2017   HGB 12.7 (L) 01/23/2017   HCT 39.1 01/25/2017   MCV 97.8 01/25/2017   PLT 301 01/23/2017     Chemistry      Component Value Date/Time   NA 145 01/25/2017 1003   NA 144 12/14/2016 1159   NA 140 10/22/2016 1129   K 3.8 01/25/2017 1003   K  3.5 12/14/2016 1159   K 4.3 10/22/2016 1129   CL 106 01/25/2017 1003   CL 103 12/14/2016 1159   CO2 23 01/25/2017 1003   CO2 30 12/14/2016 1159   CO2 27 10/22/2016 1129   BUN 13 01/25/2017 1003   BUN 16 12/14/2016 1159   BUN 14.0 10/22/2016 1129   CREATININE 1.13 01/23/2017 0954   CREATININE 1.3 (H) 12/14/2016 1159   CREATININE 1.1 10/22/2016 1129      Component Value Date/Time   CALCIUM 9.4 01/25/2017 1003   CALCIUM 10.0 12/14/2016 1159   CALCIUM 10.4 10/22/2016 1129   ALKPHOS 59 01/25/2017 1003   ALKPHOS 48 12/14/2016 1159   ALKPHOS 60 10/22/2016 1129   AST 18 01/25/2017 1003   AST 16 10/22/2016 1129   ALT 18 01/25/2017 1003   ALT 23 12/14/2016 1159   ALT 14 10/22/2016 1129   BILITOT 0.7 01/25/2017 1003   BILITOT 0.31 10/22/2016 1129         Impression and Plan: [name] is 72 year old gentleman with metastatic prostate cancer.   So far, I am just very happy that his quality of life is doing better.  He really had a hard time last year.  We will go ahead with his Lupron and Xgeva.  I think this will be helpful for him.  We will see what his MRI shows of his lower back.  I will see him back in about a month.  We will check his labs.  Is   [name], MD 1/11/201912:32 PM

## 2017-01-26 LAB — TESTOSTERONE: Testosterone: <3 ng/dL — ABNORMAL LOW (ref 264–916)

## 2017-01-28 DIAGNOSIS — M5417 Radiculopathy, lumbosacral region: Secondary | ICD-10-CM | POA: Diagnosis not present

## 2017-01-28 DIAGNOSIS — M6281 Muscle weakness (generalized): Secondary | ICD-10-CM | POA: Diagnosis not present

## 2017-01-28 DIAGNOSIS — M4710 Other spondylosis with myelopathy, site unspecified: Secondary | ICD-10-CM | POA: Diagnosis not present

## 2017-01-28 DIAGNOSIS — M545 Low back pain: Secondary | ICD-10-CM | POA: Diagnosis not present

## 2017-01-28 LAB — CHROMOGRANIN A: Chromogranin A: 1 nmol/L (ref 0–5)

## 2017-01-29 DIAGNOSIS — S34115A Complete lesion of L5 level of lumbar spinal cord, initial encounter: Secondary | ICD-10-CM | POA: Diagnosis not present

## 2017-01-29 DIAGNOSIS — M5417 Radiculopathy, lumbosacral region: Secondary | ICD-10-CM | POA: Diagnosis not present

## 2017-01-31 DIAGNOSIS — M5417 Radiculopathy, lumbosacral region: Secondary | ICD-10-CM | POA: Diagnosis not present

## 2017-01-31 DIAGNOSIS — M4710 Other spondylosis with myelopathy, site unspecified: Secondary | ICD-10-CM | POA: Diagnosis not present

## 2017-01-31 DIAGNOSIS — M6281 Muscle weakness (generalized): Secondary | ICD-10-CM | POA: Diagnosis not present

## 2017-01-31 DIAGNOSIS — M545 Low back pain: Secondary | ICD-10-CM | POA: Diagnosis not present

## 2017-02-04 DIAGNOSIS — M5417 Radiculopathy, lumbosacral region: Secondary | ICD-10-CM | POA: Diagnosis not present

## 2017-02-04 DIAGNOSIS — M6281 Muscle weakness (generalized): Secondary | ICD-10-CM | POA: Diagnosis not present

## 2017-02-04 DIAGNOSIS — M545 Low back pain: Secondary | ICD-10-CM | POA: Diagnosis not present

## 2017-02-04 DIAGNOSIS — M4710 Other spondylosis with myelopathy, site unspecified: Secondary | ICD-10-CM | POA: Diagnosis not present

## 2017-02-06 DIAGNOSIS — M545 Low back pain: Secondary | ICD-10-CM | POA: Diagnosis not present

## 2017-02-06 DIAGNOSIS — M6281 Muscle weakness (generalized): Secondary | ICD-10-CM | POA: Diagnosis not present

## 2017-02-06 DIAGNOSIS — M5417 Radiculopathy, lumbosacral region: Secondary | ICD-10-CM | POA: Diagnosis not present

## 2017-02-06 DIAGNOSIS — M4710 Other spondylosis with myelopathy, site unspecified: Secondary | ICD-10-CM | POA: Diagnosis not present

## 2017-02-08 ENCOUNTER — Other Ambulatory Visit: Payer: Self-pay | Admitting: Hematology & Oncology

## 2017-02-08 DIAGNOSIS — C7951 Secondary malignant neoplasm of bone: Secondary | ICD-10-CM

## 2017-02-08 DIAGNOSIS — R64 Cachexia: Secondary | ICD-10-CM

## 2017-02-08 MED FILL — traMADol HCL 50 MG TABS: 50 | 23 days supply | Qty: 90 | Fill #0

## 2017-02-08 MED FILL — MEGESTROL 20 MG TABLET: 20 | 30 days supply | Qty: 30 | Fill #0

## 2017-02-08 MED FILL — predniSONE 5 MG TABS: 5 | 60 days supply | Qty: 60 | Fill #3

## 2017-02-08 MED FILL — METFORMIN HCL ER 500 MG TAB: 500 | 90 days supply | Qty: 180 | Fill #2

## 2017-02-12 DIAGNOSIS — M6281 Muscle weakness (generalized): Secondary | ICD-10-CM | POA: Diagnosis not present

## 2017-02-12 DIAGNOSIS — M4710 Other spondylosis with myelopathy, site unspecified: Secondary | ICD-10-CM | POA: Diagnosis not present

## 2017-02-12 DIAGNOSIS — M5417 Radiculopathy, lumbosacral region: Secondary | ICD-10-CM | POA: Diagnosis not present

## 2017-02-12 DIAGNOSIS — M545 Low back pain: Secondary | ICD-10-CM | POA: Diagnosis not present

## 2017-02-14 DIAGNOSIS — M5417 Radiculopathy, lumbosacral region: Secondary | ICD-10-CM | POA: Diagnosis not present

## 2017-02-14 DIAGNOSIS — M545 Low back pain: Secondary | ICD-10-CM | POA: Diagnosis not present

## 2017-02-14 DIAGNOSIS — M6281 Muscle weakness (generalized): Secondary | ICD-10-CM | POA: Diagnosis not present

## 2017-02-14 DIAGNOSIS — M4710 Other spondylosis with myelopathy, site unspecified: Secondary | ICD-10-CM | POA: Diagnosis not present

## 2017-02-18 DIAGNOSIS — M545 Low back pain: Secondary | ICD-10-CM | POA: Diagnosis not present

## 2017-02-18 DIAGNOSIS — M5417 Radiculopathy, lumbosacral region: Secondary | ICD-10-CM | POA: Diagnosis not present

## 2017-02-18 DIAGNOSIS — M6281 Muscle weakness (generalized): Secondary | ICD-10-CM | POA: Diagnosis not present

## 2017-02-18 DIAGNOSIS — M4710 Other spondylosis with myelopathy, site unspecified: Secondary | ICD-10-CM | POA: Diagnosis not present

## 2017-02-20 DIAGNOSIS — M4710 Other spondylosis with myelopathy, site unspecified: Secondary | ICD-10-CM | POA: Diagnosis not present

## 2017-02-20 DIAGNOSIS — M6281 Muscle weakness (generalized): Secondary | ICD-10-CM | POA: Diagnosis not present

## 2017-02-20 DIAGNOSIS — M5417 Radiculopathy, lumbosacral region: Secondary | ICD-10-CM | POA: Diagnosis not present

## 2017-02-20 DIAGNOSIS — M545 Low back pain: Secondary | ICD-10-CM | POA: Diagnosis not present

## 2017-02-21 MED FILL — GABAPENTIN 300 MG CAPSULE: 300 | 30 days supply | Qty: 120 | Fill #3

## 2017-02-21 MED FILL — EZETIMIBE 10 MG TABS: 10 | 90 days supply | Qty: 90 | Fill #0

## 2017-02-22 ENCOUNTER — Inpatient Hospital Stay: Payer: Medicare Other | Attending: Hematology & Oncology

## 2017-02-22 ENCOUNTER — Inpatient Hospital Stay (HOSPITAL_BASED_OUTPATIENT_CLINIC_OR_DEPARTMENT_OTHER): Payer: Medicare Other | Admitting: Hematology & Oncology

## 2017-02-22 ENCOUNTER — Other Ambulatory Visit: Payer: Self-pay

## 2017-02-22 ENCOUNTER — Encounter: Payer: Self-pay | Admitting: Hematology & Oncology

## 2017-02-22 VITALS — BP 120/66 | HR 86 | Temp 98.8°F | Resp 18 | Wt 144.0 lb

## 2017-02-22 DIAGNOSIS — C7A8 Other malignant neuroendocrine tumors: Secondary | ICD-10-CM

## 2017-02-22 DIAGNOSIS — C61 Malignant neoplasm of prostate: Secondary | ICD-10-CM | POA: Insufficient documentation

## 2017-02-22 DIAGNOSIS — Z79899 Other long term (current) drug therapy: Secondary | ICD-10-CM | POA: Diagnosis not present

## 2017-02-22 DIAGNOSIS — Z5111 Encounter for antineoplastic chemotherapy: Secondary | ICD-10-CM | POA: Diagnosis present

## 2017-02-22 DIAGNOSIS — C7951 Secondary malignant neoplasm of bone: Secondary | ICD-10-CM | POA: Insufficient documentation

## 2017-02-22 DIAGNOSIS — Z7984 Long term (current) use of oral hypoglycemic drugs: Secondary | ICD-10-CM | POA: Insufficient documentation

## 2017-02-22 DIAGNOSIS — Z7982 Long term (current) use of aspirin: Secondary | ICD-10-CM

## 2017-02-22 LAB — CBC WITH DIFFERENTIAL (CANCER CENTER ONLY)
Basophils Absolute: 0 10*3/uL (ref 0.0–0.1)
Basophils Relative: 0 %
Eosinophils Absolute: 0.1 10*3/uL (ref 0.0–0.5)
Eosinophils Relative: 1 %
HCT: 36 % — ABNORMAL LOW (ref 38.7–49.9)
Hemoglobin: 11.9 g/dL — ABNORMAL LOW (ref 13.0–17.1)
Lymphocytes Relative: 9 %
Lymphs Abs: 0.7 10*3/uL — ABNORMAL LOW (ref 0.9–3.3)
MCH: 32.4 pg (ref 28.0–33.4)
MCHC: 33.1 g/dL (ref 32.0–35.9)
MCV: 98.1 fL — ABNORMAL HIGH (ref 82.0–98.0)
Monocytes Absolute: 0.6 10*3/uL (ref 0.1–0.9)
Monocytes Relative: 9 %
Neutro Abs: 5.8 10*3/uL (ref 1.5–6.5)
Neutrophils Relative %: 81 %
Platelet Count: 205 10*3/uL (ref 145–400)
RBC: 3.67 MIL/uL — ABNORMAL LOW (ref 4.20–5.70)
RDW: 13.5 % (ref 11.1–15.7)
WBC Count: 7.2 10*3/uL (ref 4.0–10.0)

## 2017-02-22 LAB — CMP (CANCER CENTER ONLY)
ALT: 19 U/L (ref 0–55)
AST: 17 U/L (ref 5–34)
Albumin: 3.5 g/dL (ref 3.5–5.0)
Alkaline Phosphatase: 56 U/L (ref 26–84)
Anion gap: 6 (ref 5–15)
BUN: 22 mg/dL (ref 7–22)
CO2: 28 mmol/L (ref 18–33)
Calcium: 9.5 mg/dL (ref 8.0–10.3)
Chloride: 106 mmol/L (ref 98–108)
Creatinine: 1.3 mg/dL (ref 0.70–1.30)
Glucose, Bld: 80 mg/dL (ref 73–118)
Potassium: 3.9 mmol/L (ref 3.3–4.7)
Sodium: 140 mmol/L (ref 128–145)
Total Bilirubin: 0.6 mg/dL (ref 0.2–1.2)
Total Protein: 6.6 g/dL (ref 6.4–8.1)

## 2017-02-22 LAB — LACTATE DEHYDROGENASE: LDH: 184 U/L (ref 125–245)

## 2017-02-22 MED FILL — VESIcare 5 MG TABS: 5 | 30 days supply | Qty: 30 | Fill #3

## 2017-02-22 NOTE — Progress Notes (Signed)
Hematology and Oncology Follow Up Visit  [name] [id number] 11-13-1945 72 y.o. 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  [name] is back for followup.  I am very impressed with his progress.  He now has a cane.  The rolling walker is no longer needed.  With the nice spring weather, he has been outside walking.  He has definitely enjoyed this.  He is not complained of any pain.  He has had no problems with nausea or vomiting.  He has had no chest wall discomfort.  He has had no issues with bowels.  He does have some urinary urges.  There is no obvious incontinence.  He had an MRI done.  This was done a few weeks ago.  Unfortunately, I still do not have the report.  He gets his next Lutathera treatment in 4 weeks.  His last chromogranin A was 2.  He has had no fever.  He has had no cough.  He has had no leg swelling.  He is doing physical therapy.  This is helping his right leg.  Overall, his performance status is ECOG 1.   Medications:  Current Outpatient Medications:  .  acetaminophen (TYLENOL) 500 MG tablet, Take 1,000 mg by mouth every 4 (four) hours as needed for moderate pain., Disp: , Rfl:  .  aspirin 81 MG tablet, Take 81 mg by mouth daily., Disp: , Rfl:  .  Calcium Carbonate-Vitamin D (CALCIUM + D PO), Take 770 mg by mouth 2 (two) times daily. , Disp: , Rfl:  .  CALCIUM-IRON-VIT D-VIT K PO, Take 1 tablet by mouth 2 (two) times daily., Disp: , Rfl:  .  dronabinol  (MARINOL) 2.5 MG capsule, Take 1 capsule (2.5 mg total) by mouth 2 (two) times daily before lunch and supper. (Patient not taking: Reported on 11/21/2016), Disp: 60 capsule, Rfl: 0 .  Eluxadoline (VIBERZI) 75 MG TABS, Take 75 mg 2 (two) times daily by mouth., Disp: , Rfl:  .  gabapentin (NEURONTIN) 300 MG capsule, TAKE 1 CAPSULE BY MOUTH TWICE A DAY THEN TAKE 2 CAPSULES AT BEDTIME, Disp: 120 capsule, Rfl: 4 .  HYDROmorphone (DILAUDID) 4 MG tablet, Take by mouth every 4 (four) hours as needed for severe pain., Disp: , Rfl:  .  isosorbide mononitrate (IMDUR) 30 MG 24 hr tablet, Take 30 mg by mouth daily., Disp: , Rfl:  .  Lutetium Lu 177 Dotatate (LUTATHERA IV), Inject into the vein., Disp: , Rfl:  .  megestrol (MEGACE) 20 MG tablet, TAKE 1 TABLET BY MOUTH DAILY, Disp: 30 tablet, Rfl: 3 .  metFORMIN (GLUCOPHAGE-XR) 500 MG 24 hr tablet, TAKE TWO TABLETS BY MOUTH DAILY WITH BREAKFAST, Disp: 180 tablet, Rfl: 2 .  ondansetron (ZOFRAN ODT) 4 MG disintegrating tablet, Take 1 tablet (4 mg total) by mouth every 8 (eight) hours as needed for nausea or vomiting., Disp: 30 tablet, Rfl: 2 .  OVER THE COUNTER MEDICATION,  every morning. Juice Plus -- 8 capsule of each the garden, vineyard, and orchead, Disp: , Rfl:  .  predniSONE (DELTASONE) 5 MG tablet, TAKE 1 TABLET BY MOUTH DAILY, Disp: 60 tablet, Rfl: 3 .  prochlorperazine (COMPAZINE) 10 MG tablet, Take 1 tablet (10 mg total) by mouth every 6 (six) hours as needed for nausea or vomiting., Disp: 30 tablet, Rfl: 0 .  traMADol (ULTRAM) 50 MG tablet, TAKE 1 TABLET BY MOUTH EVERY 6 HOURS AS NEEDED, Disp: 90 tablet, Rfl: 2 .  UNABLE TO FIND, Med Name: Juice Plus Omega Blend, Disp: , Rfl:  .  UNABLE TO FIND, Med Name: Juice Plus [employer], Garden Blend, [employer], Disp: , Rfl:  .  VESICARE 5 MG tablet, TAKE 1 TABLET BY MOUTH DAILY. (Patient taking differently: TAKE 1 TABLET BY MOUTH TWICE A WEEK), Disp: 30 tablet, Rfl: 4 .  ZETIA 10 MG tablet, Take 10 mg by  mouth daily. , Disp: , Rfl:  No current facility-administered medications for this visit.   Facility-Administered Medications Ordered in Other Visits:  .  octreotide (SANDOSTATIN LAR) IM injection 30 mg, 30 mg, Intramuscular, Once, Sumayah Bearse, Rudell Cobb, MD  Allergies:  Allergies  Allergen Reactions  . Codeine Nausea And Vomiting  . Hydrocodone Nausea And Vomiting    Past Medical History, Surgical history, Social history, and Family History were reviewed and updated.  Review of Systems: Review of Systems  Genitourinary: Positive for urgency.  Musculoskeletal: Positive for joint pain.  Neurological: Positive for focal weakness.  All other systems reviewed and are negative.    Physical Exam:  weight is 144 lb (65.3 kg). His oral temperature is 98.8 F (37.1 C). His blood pressure is 120/66 and his pulse is 86. His respiration is 18 and oxygen saturation is 100%.   Physical Exam  Constitutional: He is oriented to person, place, and time.  HENT:  Head: Normocephalic and atraumatic.  Mouth/Throat: Oropharynx is clear and moist.  Eyes: EOM are normal. Pupils are equal, round, and reactive to light.  Neck: Normal range of motion.  Cardiovascular: Normal rate, regular rhythm and normal heart sounds.  Pulmonary/Chest: Effort normal and breath sounds normal.  Abdominal: Soft. Bowel sounds are normal.  Musculoskeletal: Normal range of motion. He exhibits no edema, tenderness or deformity.  Lymphadenopathy:    He has no cervical adenopathy.  Neurological: He is alert and oriented to person, place, and time.  Skin: Skin is warm and dry. No rash noted. No erythema.  Psychiatric: He has a normal mood and affect. His behavior is normal. Judgment and thought content normal.  Vitals reviewed.    Lab Results  Component Value Date   WBC 7.2 02/22/2017   HGB 12.7 (L) 01/23/2017   HCT 36.0 (L) 02/22/2017   MCV 98.1 (H) 02/22/2017   PLT 205 02/22/2017     Chemistry      Component Value  Date/Time   NA 145 01/25/2017 1003   NA 144 12/14/2016 1159   NA 140 10/22/2016 1129   K 3.8 01/25/2017 1003   K 3.5 12/14/2016 1159   K 4.3 10/22/2016 1129   CL 106 01/25/2017 1003   CL 103 12/14/2016 1159   CO2 23 01/25/2017 1003   CO2 30 12/14/2016 1159   CO2 27 10/22/2016 1129   BUN 13 01/25/2017 1003   BUN 16 12/14/2016 1159   BUN 14.0 10/22/2016 1129   CREATININE 1.00 01/25/2017 1003   CREATININE 1.3 (H) 12/14/2016 1159   CREATININE 1.1  10/22/2016 1129      Component Value Date/Time   CALCIUM 9.4 01/25/2017 1003   CALCIUM 10.0 12/14/2016 1159   CALCIUM 10.4 10/22/2016 1129   ALKPHOS 59 01/25/2017 1003   ALKPHOS 48 12/14/2016 1159   ALKPHOS 60 10/22/2016 1129   AST 18 01/25/2017 1003   AST 16 10/22/2016 1129   ALT 18 01/25/2017 1003   ALT 23 12/14/2016 1159   ALT 14 10/22/2016 1129   BILITOT 0.7 01/25/2017 1003   BILITOT 0.31 10/22/2016 1129         Impression and Plan: Mr. Rayl is 72 year old gentleman with metastatic prostate cancer.  This is actually a neuroendocrine variant.  I think that his prostate cancer must have dedifferentiated a little bit.  I really need to see what the MRI looks like.  He has 3 more treatments of Lutathera.  I will plan to see him back the end of March.  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  He will need his Lupron and also Xgeva.  I spent about 30 minutes with he and his wife.  Over 50% of the time was spent face-to-face.  I reviewed his labs.  I went over my recommendations.  We talked about our plans for monitoring after the Lutathera is finished.    [name], MD 2/8/20199:53 AM

## 2017-02-23 LAB — PROSTATE-SPECIFIC AG, SERUM (LABCORP): Prostate Specific Ag, Serum: <0.1 ng/mL (ref 0.0–4.0)

## 2017-02-23 LAB — TESTOSTERONE: Testosterone: <3 ng/dL — ABNORMAL LOW (ref 264–916)

## 2017-02-25 DIAGNOSIS — M6281 Muscle weakness (generalized): Secondary | ICD-10-CM | POA: Diagnosis not present

## 2017-02-25 DIAGNOSIS — M5417 Radiculopathy, lumbosacral region: Secondary | ICD-10-CM | POA: Diagnosis not present

## 2017-02-25 DIAGNOSIS — M4710 Other spondylosis with myelopathy, site unspecified: Secondary | ICD-10-CM | POA: Diagnosis not present

## 2017-02-25 DIAGNOSIS — M545 Low back pain: Secondary | ICD-10-CM | POA: Diagnosis not present

## 2017-02-25 LAB — TESTOSTERONE

## 2017-02-25 LAB — CHROMOGRANIN A: Chromogranin A: 1 nmol/L (ref 0–5)

## 2017-02-27 DIAGNOSIS — M6281 Muscle weakness (generalized): Secondary | ICD-10-CM | POA: Diagnosis not present

## 2017-02-27 DIAGNOSIS — M545 Low back pain: Secondary | ICD-10-CM | POA: Diagnosis not present

## 2017-02-27 DIAGNOSIS — M5417 Radiculopathy, lumbosacral region: Secondary | ICD-10-CM | POA: Diagnosis not present

## 2017-02-27 DIAGNOSIS — M4710 Other spondylosis with myelopathy, site unspecified: Secondary | ICD-10-CM | POA: Diagnosis not present

## 2017-03-04 DIAGNOSIS — M545 Low back pain: Secondary | ICD-10-CM | POA: Diagnosis not present

## 2017-03-04 DIAGNOSIS — M5417 Radiculopathy, lumbosacral region: Secondary | ICD-10-CM | POA: Diagnosis not present

## 2017-03-04 DIAGNOSIS — M6281 Muscle weakness (generalized): Secondary | ICD-10-CM | POA: Diagnosis not present

## 2017-03-04 DIAGNOSIS — M4710 Other spondylosis with myelopathy, site unspecified: Secondary | ICD-10-CM | POA: Diagnosis not present

## 2017-03-08 ENCOUNTER — Other Ambulatory Visit: Payer: Medicare Other

## 2017-03-08 ENCOUNTER — Ambulatory Visit: Payer: Medicare Other | Admitting: Hematology & Oncology

## 2017-03-08 ENCOUNTER — Ambulatory Visit: Payer: Medicare Other

## 2017-03-11 DIAGNOSIS — M6281 Muscle weakness (generalized): Secondary | ICD-10-CM | POA: Diagnosis not present

## 2017-03-11 DIAGNOSIS — M4710 Other spondylosis with myelopathy, site unspecified: Secondary | ICD-10-CM | POA: Diagnosis not present

## 2017-03-11 DIAGNOSIS — M545 Low back pain: Secondary | ICD-10-CM | POA: Diagnosis not present

## 2017-03-11 DIAGNOSIS — M5417 Radiculopathy, lumbosacral region: Secondary | ICD-10-CM | POA: Diagnosis not present

## 2017-03-14 DIAGNOSIS — M6281 Muscle weakness (generalized): Secondary | ICD-10-CM | POA: Diagnosis not present

## 2017-03-14 DIAGNOSIS — M4710 Other spondylosis with myelopathy, site unspecified: Secondary | ICD-10-CM | POA: Diagnosis not present

## 2017-03-14 DIAGNOSIS — M545 Low back pain: Secondary | ICD-10-CM | POA: Diagnosis not present

## 2017-03-14 DIAGNOSIS — M5417 Radiculopathy, lumbosacral region: Secondary | ICD-10-CM | POA: Diagnosis not present

## 2017-03-20 ENCOUNTER — Ambulatory Visit: Payer: Medicare Other

## 2017-03-20 ENCOUNTER — Encounter (HOSPITAL_COMMUNITY)
Admission: RE | Admit: 2017-03-20 | Discharge: 2017-03-20 | Disposition: A | Discharge status: Home / Self Care | Payer: Medicare Other | Source: Ambulatory Visit | Attending: Hematology & Oncology | Admitting: Hematology & Oncology

## 2017-03-20 DIAGNOSIS — C7B8 Other secondary neuroendocrine tumors: Secondary | ICD-10-CM | POA: Diagnosis not present

## 2017-03-20 DIAGNOSIS — C7A8 Other malignant neuroendocrine tumors: Secondary | ICD-10-CM | POA: Insufficient documentation

## 2017-03-20 DIAGNOSIS — C7A1 Malignant poorly differentiated neuroendocrine tumors: Secondary | ICD-10-CM | POA: Diagnosis not present

## 2017-03-20 LAB — COMPREHENSIVE METABOLIC PANEL
ALT: 17 U/L (ref 17–63)
AST: 22 U/L (ref 15–41)
Albumin: 4 g/dL (ref 3.5–5.0)
Alkaline Phosphatase: 60 U/L (ref 38–126)
Anion gap: 9 (ref 5–15)
BUN: 16 mg/dL (ref 6–20)
CO2: 27 mmol/L (ref 22–32)
Calcium: 9.4 mg/dL (ref 8.9–10.3)
Chloride: 103 mmol/L (ref 101–111)
Creatinine, Ser: 1.18 mg/dL (ref 0.61–1.24)
GFR calc Af Amer: >60 mL/min (ref >60)
GFR calc non Af Amer: >60 mL/min (ref >60)
Glucose, Bld: 86 mg/dL (ref 65–99)
Potassium: 3.7 mmol/L (ref 3.5–5.1)
Sodium: 139 mmol/L (ref 135–145)
Total Bilirubin: 0.6 mg/dL (ref 0.3–1.2)
Total Protein: 7.3 g/dL (ref 6.5–8.1)

## 2017-03-20 LAB — CBC WITH DIFFERENTIAL/PLATELET
Basophils Absolute: 0 10*3/uL (ref 0.0–0.1)
Basophils Relative: 0 %
Eosinophils Absolute: 0.1 10*3/uL (ref 0.0–0.7)
Eosinophils Relative: 1 %
HCT: 37.3 % — ABNORMAL LOW (ref 39.0–52.0)
Hemoglobin: 12.1 g/dL — ABNORMAL LOW (ref 13.0–17.0)
Lymphocytes Relative: 12 %
Lymphs Abs: 0.7 10*3/uL (ref 0.7–4.0)
MCH: 32.4 pg (ref 26.0–34.0)
MCHC: 32.4 g/dL (ref 30.0–36.0)
MCV: 99.7 fL (ref 78.0–100.0)
Monocytes Absolute: 0.6 10*3/uL (ref 0.1–1.0)
Monocytes Relative: 10 %
Neutro Abs: 4.8 10*3/uL (ref 1.7–7.7)
Neutrophils Relative %: 77 %
Platelets: 261 10*3/uL (ref 150–400)
RBC: 3.74 MIL/uL — ABNORMAL LOW (ref 4.22–5.81)
RDW: 14 % (ref 11.5–15.5)
WBC: 6.3 10*3/uL (ref 4.0–10.5)

## 2017-03-20 MED ORDER — OCTREOTIDE ACETATE 30 MG IM KIT
PACK | INTRAMUSCULAR | Status: AC
  Filled 2017-03-20: qty 1

## 2017-03-20 MED ORDER — SODIUM CHLORIDE 0.9 % IV SOLN: 500.0000 mL | Freq: Once | INTRAVENOUS | Status: DC

## 2017-03-20 MED ORDER — OCTREOTIDE ACETATE 500 MCG/ML IJ SOLN: 500.0000 ug | Freq: Once | INTRAMUSCULAR | Status: DC | PRN

## 2017-03-20 MED ORDER — AMINO ACID RADIOPROTECTANT - L-LYSINE 2.5%/L-ARGININE 2.5% IN NS
250.0000 mL/h | INTRAVENOUS | Status: AC
  Administered 2017-03-20: 250 mL/h via INTRAVENOUS
  Filled 2017-03-20: qty 1000

## 2017-03-20 MED ORDER — OCTREOTIDE ACETATE 30 MG IM KIT
30.0000 mg | PACK | Freq: Once | INTRAMUSCULAR | Status: AC
  Administered 2017-03-20: 30 mg via INTRAMUSCULAR

## 2017-03-20 MED ORDER — LUTETIUM LU 177 DOTATATE 370 MBQ/ML IV SOLN
200.0000 | Freq: Once | INTRAVENOUS | Status: AC
  Administered 2017-03-20: 208.7 via INTRAVENOUS

## 2017-03-20 MED ORDER — SODIUM CHLORIDE 0.9 % IV SOLN
8.0000 mg | Freq: Once | INTRAVENOUS | Status: AC
  Administered 2017-03-20: 8 mg via INTRAVENOUS
  Filled 2017-03-20: qty 4

## 2017-03-20 MED ORDER — OCTREOTIDE ACETATE 500 MCG/ML IJ SOLN
INTRAMUSCULAR | Status: AC
  Filled 2017-03-20: qty 1

## 2017-03-20 NOTE — Progress Notes (Signed)
Pt completed the Lutathera therapy without incident.  IV in left AC flushed with 52ml of NaCl to ensure Lutathera is cleared from IV. VS remain stable.  IV sites assess and no signs of infiltration noted.  Pt voided and is now eating food. Pt encouraged to drink fluids.

## 2017-03-20 NOTE — Progress Notes (Signed)
Pt completed amino acid infusion.  IV site on the right arm became slightly red three hours info amino acid infusion.  Therefore the infusion was switched to the left arm and infusion was completed at 13:44.  Both IVs removed at that time.  The previously noted redness in left arm had disappeared.  Pt denied any pain and VS remained stable during and after infusion.

## 2017-03-20 NOTE — Progress Notes (Signed)
Diagnosis: [Metastatic neuroendocrine tumor.]  Current Infusion: [2]  Planned Infusions: [4]  Patient presented to nuclear medicine for treatment.  Patient laboratory values are within normal limits other than chronic mild anemia.  Patient experienced mild hair loss during  First treatment.  Otherwise no symptoms.  Patient  remains a good candidate to proceed with Lutathera. The patient was situated in an infusion suite and administered Lutathera as above. Patient will follow-up with referring oncologist for interval serum laboratories.  Patient received long-acting Sandostatin 30 mg IM  injection same day in nuclear medicine department.

## 2017-03-29 ENCOUNTER — Other Ambulatory Visit: Payer: Medicare Other

## 2017-04-02 DIAGNOSIS — H524 Presbyopia: Secondary | ICD-10-CM | POA: Diagnosis not present

## 2017-04-02 DIAGNOSIS — H25813 Combined forms of age-related cataract, bilateral: Secondary | ICD-10-CM | POA: Diagnosis not present

## 2017-04-02 DIAGNOSIS — H43313 Vitreous membranes and strands, bilateral: Secondary | ICD-10-CM | POA: Diagnosis not present

## 2017-04-02 DIAGNOSIS — H52223 Regular astigmatism, bilateral: Secondary | ICD-10-CM | POA: Diagnosis not present

## 2017-04-02 DIAGNOSIS — H5203 Hypermetropia, bilateral: Secondary | ICD-10-CM | POA: Diagnosis not present

## 2017-04-02 DIAGNOSIS — H43393 Other vitreous opacities, bilateral: Secondary | ICD-10-CM | POA: Diagnosis not present

## 2017-04-02 DIAGNOSIS — H43813 Vitreous degeneration, bilateral: Secondary | ICD-10-CM | POA: Diagnosis not present

## 2017-04-05 ENCOUNTER — Other Ambulatory Visit: Payer: Self-pay | Admitting: Hematology & Oncology

## 2017-04-05 MED FILL — predniSONE 5 MG TABS: 5 | 60 days supply | Qty: 60 | Fill #0

## 2017-04-05 MED FILL — GABAPENTIN 300 MG CAPSULE: 300 | 30 days supply | Qty: 120 | Fill #4

## 2017-04-09 DIAGNOSIS — L570 Actinic keratosis: Secondary | ICD-10-CM | POA: Diagnosis not present

## 2017-04-09 DIAGNOSIS — L821 Other seborrheic keratosis: Secondary | ICD-10-CM | POA: Diagnosis not present

## 2017-04-09 DIAGNOSIS — L578 Other skin changes due to chronic exposure to nonionizing radiation: Secondary | ICD-10-CM | POA: Diagnosis not present

## 2017-04-11 ENCOUNTER — Other Ambulatory Visit (HOSPITAL_COMMUNITY): Payer: Self-pay | Admitting: Endocrinology

## 2017-04-19 ENCOUNTER — Inpatient Hospital Stay: Payer: Medicare Other

## 2017-04-19 ENCOUNTER — Inpatient Hospital Stay: Payer: Medicare Other | Attending: Hematology & Oncology | Admitting: Hematology & Oncology

## 2017-04-19 ENCOUNTER — Other Ambulatory Visit: Payer: Self-pay

## 2017-04-19 ENCOUNTER — Encounter: Payer: Self-pay | Admitting: Hematology & Oncology

## 2017-04-19 VITALS — BP 86/60 | HR 82 | Temp 98.2°F | Resp 16 | Wt 150.0 lb

## 2017-04-19 DIAGNOSIS — E119 Type 2 diabetes mellitus without complications: Secondary | ICD-10-CM | POA: Diagnosis not present

## 2017-04-19 DIAGNOSIS — Z7982 Long term (current) use of aspirin: Secondary | ICD-10-CM | POA: Diagnosis not present

## 2017-04-19 DIAGNOSIS — C7951 Secondary malignant neoplasm of bone: Secondary | ICD-10-CM | POA: Insufficient documentation

## 2017-04-19 DIAGNOSIS — C61 Malignant neoplasm of prostate: Secondary | ICD-10-CM

## 2017-04-19 DIAGNOSIS — Z5111 Encounter for antineoplastic chemotherapy: Secondary | ICD-10-CM | POA: Diagnosis not present

## 2017-04-19 DIAGNOSIS — Z7984 Long term (current) use of oral hypoglycemic drugs: Secondary | ICD-10-CM

## 2017-04-19 DIAGNOSIS — Z79899 Other long term (current) drug therapy: Secondary | ICD-10-CM | POA: Diagnosis not present

## 2017-04-19 DIAGNOSIS — C7A8 Other malignant neuroendocrine tumors: Secondary | ICD-10-CM

## 2017-04-19 LAB — CMP (CANCER CENTER ONLY)
ALT: 17 U/L (ref 10–47)
AST: 17 U/L (ref 11–38)
Albumin: 3.4 g/dL — ABNORMAL LOW (ref 3.5–5.0)
Alkaline Phosphatase: 61 U/L (ref 26–84)
Anion gap: 4 — ABNORMAL LOW (ref 5–15)
BUN: 16 mg/dL (ref 7–22)
CO2: 31 mmol/L (ref 18–33)
Calcium: 9.1 mg/dL (ref 8.0–10.3)
Chloride: 104 mmol/L (ref 98–108)
Creatinine: 0.8 mg/dL (ref 0.60–1.20)
Glucose, Bld: 85 mg/dL (ref 73–118)
Potassium: 4.4 mmol/L (ref 3.3–4.7)
Sodium: 139 mmol/L (ref 128–145)
Total Bilirubin: 0.5 mg/dL (ref 0.2–1.6)
Total Protein: 6.6 g/dL (ref 6.4–8.1)

## 2017-04-19 LAB — CBC WITH DIFFERENTIAL (CANCER CENTER ONLY)
Basophils Absolute: 0 10*3/uL (ref 0.0–0.1)
Basophils Relative: 1 %
Eosinophils Absolute: 0.2 10*3/uL (ref 0.0–0.5)
Eosinophils Relative: 3 %
HCT: 34.1 % — ABNORMAL LOW (ref 38.7–49.9)
Hemoglobin: 11 g/dL — ABNORMAL LOW (ref 13.0–17.1)
Lymphocytes Relative: 10 %
Lymphs Abs: 0.6 10*3/uL — ABNORMAL LOW (ref 0.9–3.3)
MCH: 32.6 pg (ref 28.0–33.4)
MCHC: 32.3 g/dL (ref 32.0–35.9)
MCV: 101.2 fL — ABNORMAL HIGH (ref 82.0–98.0)
Monocytes Absolute: 0.6 10*3/uL (ref 0.1–0.9)
Monocytes Relative: 10 %
Neutro Abs: 4.6 10*3/uL (ref 1.5–6.5)
Neutrophils Relative %: 76 %
Platelet Count: 210 10*3/uL (ref 145–400)
RBC: 3.37 MIL/uL — ABNORMAL LOW (ref 4.20–5.70)
RDW: 13 % (ref 11.1–15.7)
WBC Count: 6 10*3/uL (ref 4.0–10.0)

## 2017-04-19 MED ORDER — DENOSUMAB 120 MG/1.7ML ~~LOC~~ SOLN
120.0000 mg | Freq: Once | SUBCUTANEOUS | Status: AC
  Administered 2017-04-19: 120 mg via SUBCUTANEOUS

## 2017-04-19 MED ORDER — DENOSUMAB 120 MG/1.7ML ~~LOC~~ SOLN
SUBCUTANEOUS | Status: AC
  Filled 2017-04-19: qty 1.7

## 2017-04-19 MED ORDER — LEUPROLIDE ACETATE (3 MONTH) 22.5 MG IM KIT
22.5000 mg | PACK | Freq: Once | INTRAMUSCULAR | Status: AC
  Administered 2017-04-19: 22.5 mg via INTRAMUSCULAR
  Filled 2017-04-19: qty 22.5

## 2017-04-19 MED FILL — VESIcare 5 MG TABS: 5 | 30 days supply | Qty: 30 | Fill #4

## 2017-04-19 NOTE — Patient Instructions (Signed)
Denosumab injection (Xgeva) What is this medicine? DENOSUMAB (den oh sue mab) slows bone breakdown. Prolia is used to treat osteoporosis in women after menopause and in men. Xgeva is used to treat a high calcium level due to cancer and to prevent bone fractures and other bone problems caused by multiple myeloma or cancer bone metastases. Xgeva is also used to treat giant cell tumor of the bone. This medicine may be used for other purposes; ask your health care provider or pharmacist if you have questions. COMMON BRAND NAME(S): Prolia, XGEVA What should I tell my health care provider before I take this medicine? They need to know if you have any of these conditions: -dental disease -having surgery or tooth extraction -infection -kidney disease -low levels of calcium or Vitamin D in the blood -malnutrition -on hemodialysis -skin conditions or sensitivity -thyroid or parathyroid disease -an unusual reaction to denosumab, other medicines, foods, dyes, or preservatives -pregnant or trying to get pregnant -breast-feeding How should I use this medicine? This medicine is for injection under the skin. It is given by a health care professional in a hospital or clinic setting. If you are getting Prolia, a special MedGuide will be given to you by the pharmacist with each prescription and refill. Be sure to read this information carefully each time. For Prolia, talk to your pediatrician regarding the use of this medicine in children. Special care may be needed. For Xgeva, talk to your pediatrician regarding the use of this medicine in children. While this drug may be prescribed for children as young as 13 years for selected conditions, precautions do apply. Overdosage: If you think you have taken too much of this medicine contact a poison control center or emergency room at once. NOTE: This medicine is only for you. Do not share this medicine with others. What if I miss a dose? It is important not to  miss your dose. Call your doctor or health care professional if you are unable to keep an appointment. What may interact with this medicine? Do not take this medicine with any of the following medications: -other medicines containing denosumab This medicine may also interact with the following medications: -medicines that lower your chance of fighting infection -steroid medicines like prednisone or cortisone This list may not describe all possible interactions. Give your health care provider a list of all the medicines, herbs, non-prescription drugs, or dietary supplements you use. Also tell them if you smoke, drink alcohol, or use illegal drugs. Some items may interact with your medicine. What should I watch for while using this medicine? Visit your doctor or health care professional for regular checks on your progress. Your doctor or health care professional may order blood tests and other tests to see how you are doing. Call your doctor or health care professional for advice if you get a fever, chills or sore throat, or other symptoms of a cold or flu. Do not treat yourself. This drug may decrease your body's ability to fight infection. Try to avoid being around people who are sick. You should make sure you get enough calcium and vitamin D while you are taking this medicine, unless your doctor tells you not to. Discuss the foods you eat and the vitamins you take with your health care professional. See your dentist regularly. Brush and floss your teeth as directed. Before you have any dental work done, tell your dentist you are receiving this medicine. Do not become pregnant while taking this medicine or for 5 months after   stopping it. Talk with your doctor or health care professional about your birth control options while taking this medicine. Women should inform their doctor if they wish to become pregnant or think they might be pregnant. There is a potential for serious side effects to an unborn  child. Talk to your health care professional or pharmacist for more information. What side effects may I notice from receiving this medicine? Side effects that you should report to your doctor or health care professional as soon as possible: -allergic reactions like skin rash, itching or hives, swelling of the face, lips, or tongue -bone pain -breathing problems -dizziness -jaw pain, especially after dental work -redness, blistering, peeling of the skin -signs and symptoms of infection like fever or chills; cough; sore throat; pain or trouble passing urine -signs of low calcium like fast heartbeat, muscle cramps or muscle pain; pain, tingling, numbness in the hands or feet; seizures -unusual bleeding or bruising -unusually weak or tired Side effects that usually do not require medical attention (report to your doctor or health care professional if they continue or are bothersome): -constipation -diarrhea -headache -joint pain -loss of appetite -muscle pain -runny nose -tiredness -upset stomach This list may not describe all possible side effects. Call your doctor for medical advice about side effects. You may report side effects to FDA at 1-800-FDA-1088. Where should I keep my medicine? This medicine is only given in a clinic, doctor's office, or other health care setting and will not be stored at home. NOTE: This sheet is a summary. It may not cover all possible information. If you have questions about this medicine, talk to your doctor, pharmacist, or health care provider.  2018 Elsevier/Gold Standard (2016-01-24 19:17:21)   Leuprolide injection (Leupron) What is this medicine? LEUPROLIDE (loo PROE lide) is a man-made hormone. It is used to treat the symptoms of prostate cancer. This medicine may also be used to treat children with early onset of puberty. It may be used for other hormonal conditions. This medicine may be used for other purposes; ask your health care provider or  pharmacist if you have questions. COMMON BRAND NAME(S): Lupron What should I tell my health care provider before I take this medicine? They need to know if you have any of these conditions: -diabetes -heart disease or previous heart attack -high blood pressure -high cholesterol -pain or difficulty passing urine -spinal cord metastasis -stroke -tobacco smoker -an unusual or allergic reaction to leuprolide, benzyl alcohol, other medicines, foods, dyes, or preservatives -pregnant or trying to get pregnant -breast-feeding How should I use this medicine? This medicine is for injection under the skin or into a muscle. You will be taught how to prepare and give this medicine. Use exactly as directed. Take your medicine at regular intervals. Do not take your medicine more often than directed. It is important that you put your used needles and syringes in a special sharps container. Do not put them in a trash can. If you do not have a sharps container, call your pharmacist or healthcare provider to get one. A special MedGuide will be given to you by the pharmacist with each prescription and refill. Be sure to read this information carefully each time. Talk to your pediatrician regarding the use of this medicine in children. While this medicine may be prescribed for children as young as 8 years for selected conditions, precautions do apply. Overdosage: If you think you have taken too much of this medicine contact a poison control center or emergency room  at once. NOTE: This medicine is only for you. Do not share this medicine with others. What if I miss a dose? If you miss a dose, take it as soon as you can. If it is almost time for your next dose, take only that dose. Do not take double or extra doses. What may interact with this medicine? Do not take this medicine with any of the following medications: -chasteberry This medicine may also interact with the following medications: -herbal or dietary  supplements, like black cohosh or DHEA -male hormones, like estrogens or progestins and birth control pills, patches, rings, or injections -male hormones, like testosterone This list may not describe all possible interactions. Give your health care provider a list of all the medicines, herbs, non-prescription drugs, or dietary supplements you use. Also tell them if you smoke, drink alcohol, or use illegal drugs. Some items may interact with your medicine. What should I watch for while using this medicine? Visit your doctor or health care professional for regular checks on your progress. During the first week, your symptoms may get worse, but then will improve as you continue your treatment. You may get hot flashes, increased bone pain, increased difficulty passing urine, or an aggravation of nerve symptoms. Discuss these effects with your doctor or health care professional, some of them may improve with continued use of this medicine. Male patients may experience a menstrual cycle or spotting during the first 2 months of therapy with this medicine. If this continues, contact your doctor or health care professional. What side effects may I notice from receiving this medicine? Side effects that you should report to your doctor or health care professional as soon as possible: -allergic reactions like skin rash, itching or hives, swelling of the face, lips, or tongue -breathing problems -chest pain -depression or memory disorders -pain in your legs or groin -pain at site where injected -severe headache -swelling of the feet and legs -visual changes -vomiting Side effects that usually do not require medical attention (report to your doctor or health care professional if they continue or are bothersome): -breast swelling or tenderness -decrease in sex drive or performance -diarrhea -hot flashes -loss of appetite -muscle, joint, or bone pains -nausea -redness or irritation at site where  injected -skin problems or acne This list may not describe all possible side effects. Call your doctor for medical advice about side effects. You may report side effects to FDA at 1-800-FDA-1088. Where should I keep my medicine? Keep out of the reach of children. Store below 25 degrees C (77 degrees F). Do not freeze. Protect from light. Do not use if it is not clear or if there are particles present. Throw away any unused medicine after the expiration date. NOTE: This sheet is a summary. It may not cover all possible information. If you have questions about this medicine, talk to your doctor, pharmacist, or health care provider.  2018 Elsevier/Gold Standard (2015-08-22 10:54:35)

## 2017-04-19 NOTE — Progress Notes (Signed)
Hematology and Oncology Follow Up Visit  RUTHVIK BARNABY [id number] [birth year] 72 y.o. 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  Dunaj is back for followup.  He really is doing nicely.  He is more functional.  I am just happy that he is able to have a better quality of life.  He has had no cough or shortness of breath.  His appetite is good.  His blood sugars are being monitored.  Her graph his blood pressure was little bit low today.  I checked it with my own blood pressure cuff.  I got his blood pressure at 100/60.  I think he sees his family doctor on Monday.  He gets his next Lutathera on May 1.  I think would be worthwhile getting another Axumin PET scan in a couple weeks to see how everything looks.  His chromogranin A has always been normal.  Overall, his performance status is ECOG 1.   Medications:  Current Outpatient Medications:  .  acetaminophen (TYLENOL) 500 MG tablet, Take 1,000 mg by mouth every 4 (four) hours as needed for moderate pain., Disp: , Rfl:  .  aspirin 81 MG tablet, Take 81 mg by mouth daily., Disp: , Rfl:  .  Calcium Carbonate-Vitamin D (CALCIUM + D PO), Take 770 mg by mouth 2 (two) times daily. , Disp: , Rfl:  .  CALCIUM-IRON-VIT D-VIT K PO, Take 1 tablet by mouth 2 (two) times daily., Disp: , Rfl:  .  Eluxadoline (VIBERZI) 75 MG TABS, Take 75 mg 2 (two) times daily by mouth., Disp: , Rfl:  .  gabapentin (NEURONTIN) 300 MG capsule, TAKE 1 CAPSULE BY MOUTH TWICE A  DAY THEN TAKE 2 CAPSULES AT BEDTIME, Disp: 120 capsule, Rfl: 4 .  HYDROmorphone (DILAUDID) 4 MG tablet, Take by mouth every 4 (four) hours as needed for severe pain., Disp: , Rfl:  .  isosorbide mononitrate (IMDUR) 30 MG 24 hr tablet, Take 30 mg by mouth daily., Disp: , Rfl:  .  Lutetium Lu 177 Dotatate (LUTATHERA IV), Inject into the vein., Disp: , Rfl:  .  metFORMIN (GLUCOPHAGE-XR) 500 MG 24 hr tablet, TAKE TWO TABLETS BY MOUTH DAILY WITH BREAKFAST, Disp: 180 tablet, Rfl: 2 .  ondansetron (ZOFRAN ODT) 4 MG disintegrating tablet, Take 1 tablet (4 mg total) by mouth every 8 (eight) hours as needed for nausea or vomiting., Disp: 30 tablet, Rfl: 2 .  OVER THE COUNTER MEDICATION, every morning. Juice Plus -- 8 capsule of each the garden, vineyard, and orchead, Disp: , Rfl:  .  predniSONE (DELTASONE) 5 MG tablet, TAKE 1 TABLET BY MOUTH DAILY, Disp: 60 tablet, Rfl: 3 .  prochlorperazine (COMPAZINE) 10 MG tablet, Take 1 tablet (10 mg total) by mouth every 6 (six) hours as needed for nausea or vomiting., Disp: 30 tablet, Rfl: 0 .  traMADol (ULTRAM) 50 MG tablet, TAKE 1 TABLET BY MOUTH EVERY 6 HOURS AS NEEDED, Disp: 90  tablet, Rfl: 2 .  UNABLE TO FIND, Med Name: Juice Plus Omega Blend, Disp: , Rfl:  .  UNABLE TO FIND, Med Name: Juice Plus [employer], Garden Blend, [employer], Disp: , Rfl:  .  VESICARE 5 MG tablet, TAKE 1 TABLET BY MOUTH DAILY. (Patient taking differently: TAKE 1 TABLET BY MOUTH TWICE A WEEK), Disp: 30 tablet, Rfl: 4 .  ZETIA 10 MG tablet, Take 10 mg by mouth daily. , Disp: , Rfl:  No current facility-administered medications for this visit.   Facility-Administered Medications Ordered in Other Visits:  .  octreotide (SANDOSTATIN LAR) IM injection 30 mg, 30 mg, Intramuscular, Once, Ennever, Rudell Cobb, MD  Allergies:  Allergies  Allergen Reactions  . Codeine Nausea And Vomiting  . Hydrocodone Nausea And Vomiting    Past Medical History, Surgical history, Social history, and  Family History were reviewed and updated.  Review of Systems: Review of Systems  Genitourinary: Positive for urgency.  Musculoskeletal: Positive for joint pain.  Neurological: Positive for focal weakness.  All other systems reviewed and are negative.    Physical Exam:  weight is 150 lb (68 kg). His oral temperature is 98.2 F (36.8 C). His blood pressure is 86/60 (abnormal) and his pulse is 82. His respiration is 16 and oxygen saturation is 100%.   Physical Exam  Constitutional: He is oriented to person, place, and time.  HENT:  Head: Normocephalic and atraumatic.  Mouth/Throat: Oropharynx is clear and moist.  Eyes: Pupils are equal, round, and reactive to light. EOM are normal.  Neck: Normal range of motion.  Cardiovascular: Normal rate, regular rhythm and normal heart sounds.  Pulmonary/Chest: Effort normal and breath sounds normal.  Abdominal: Soft. Bowel sounds are normal.  Musculoskeletal: Normal range of motion. He exhibits no edema, tenderness or deformity.  Lymphadenopathy:    He has no cervical adenopathy.  Neurological: He is alert and oriented to person, place, and time.  Skin: Skin is warm and dry. No rash noted. No erythema.  Psychiatric: He has a normal mood and affect. His behavior is normal. Judgment and thought content normal.  Vitals reviewed.    Lab Results  Component Value Date   WBC 6.0 04/19/2017   HGB 12.1 (L) 03/20/2017   HCT 34.1 (L) 04/19/2017   MCV 101.2 (H) 04/19/2017   PLT 210 04/19/2017     Chemistry      Component Value Date/Time   NA 139 04/19/2017 0942   NA 144 12/14/2016 1159   NA 140 10/22/2016 1129   K 4.4 04/19/2017 0942   K 3.5 12/14/2016 1159   K 4.3 10/22/2016 1129   CL 104 04/19/2017 0942   CL 103 12/14/2016 1159   CO2 31 04/19/2017 0942   CO2 30 12/14/2016 1159   CO2 27 10/22/2016 1129   BUN 16 04/19/2017 0942   BUN 16 12/14/2016 1159   BUN 14.0 10/22/2016 1129   CREATININE 0.80 04/19/2017 0942   CREATININE 1.3 (H)  12/14/2016 1159   CREATININE 1.1 10/22/2016 1129      Component Value Date/Time   CALCIUM 9.1 04/19/2017 0942   CALCIUM 10.0 12/14/2016 1159   CALCIUM 10.4 10/22/2016 1129   ALKPHOS 61 04/19/2017 0942   ALKPHOS 48 12/14/2016 1159   ALKPHOS 60 10/22/2016 1129   AST 17 04/19/2017 0942   AST 16 10/22/2016 1129   ALT 17 04/19/2017 0942   ALT 23 12/14/2016 1159   ALT 14 10/22/2016 1129   BILITOT 0.5 04/19/2017 2536  BILITOT 0.31 10/22/2016 1129         Impression and Plan: Mr. Stowers is 72 year old gentleman with metastatic prostate cancer.  This is actually a neuroendocrine variant.  I think that his prostate cancer must have dedifferentiated a little bit.  I really need to see what the MRI looks like.  He has 2 more treatments of Lutathera.  I will plan to see him back in 6 weeks..  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  He will receive his Lupron and  Xgeva today.  I spent about 30 minutes with he and his wife.  Over 50% of the time was spent face-to-face.  I reviewed his labs.  I went over my recommendations.  We talked about our plans for monitoring after the Lutathera is finished.    [name], MD 4/5/20195:39 PM

## 2017-04-20 LAB — TESTOSTERONE: Testosterone: <3 ng/dL — ABNORMAL LOW (ref 264–916)

## 2017-04-20 LAB — PROSTATE-SPECIFIC AG, SERUM (LABCORP): Prostate Specific Ag, Serum: <0.1 ng/mL (ref 0.0–4.0)

## 2017-04-22 DIAGNOSIS — Z1211 Encounter for screening for malignant neoplasm of colon: Secondary | ICD-10-CM | POA: Diagnosis not present

## 2017-04-22 DIAGNOSIS — E785 Hyperlipidemia, unspecified: Secondary | ICD-10-CM | POA: Diagnosis not present

## 2017-04-22 DIAGNOSIS — E119 Type 2 diabetes mellitus without complications: Secondary | ICD-10-CM | POA: Diagnosis not present

## 2017-04-22 DIAGNOSIS — Z6822 Body mass index (BMI) 22.0-22.9, adult: Secondary | ICD-10-CM | POA: Diagnosis not present

## 2017-04-22 DIAGNOSIS — C61 Malignant neoplasm of prostate: Secondary | ICD-10-CM | POA: Diagnosis not present

## 2017-04-22 LAB — CHROMOGRANIN A: Chromogranin A: 1 nmol/L (ref 0–5)

## 2017-04-23 DIAGNOSIS — E119 Type 2 diabetes mellitus without complications: Secondary | ICD-10-CM | POA: Diagnosis not present

## 2017-04-23 DIAGNOSIS — E785 Hyperlipidemia, unspecified: Secondary | ICD-10-CM | POA: Diagnosis not present

## 2017-04-29 ENCOUNTER — Other Ambulatory Visit: Payer: Self-pay | Admitting: Hematology & Oncology

## 2017-04-29 ENCOUNTER — Other Ambulatory Visit (HOSPITAL_COMMUNITY): Payer: Self-pay | Admitting: Diagnostic Radiology

## 2017-04-29 DIAGNOSIS — C7A8 Other malignant neuroendocrine tumors: Secondary | ICD-10-CM

## 2017-05-09 ENCOUNTER — Telehealth: Payer: Self-pay | Admitting: Hematology & Oncology

## 2017-05-09 ENCOUNTER — Other Ambulatory Visit: Payer: Self-pay | Admitting: Family

## 2017-05-09 ENCOUNTER — Other Ambulatory Visit: Payer: Self-pay | Admitting: Hematology & Oncology

## 2017-05-09 DIAGNOSIS — C801 Malignant (primary) neoplasm, unspecified: Secondary | ICD-10-CM

## 2017-05-09 DIAGNOSIS — G63 Polyneuropathy in diseases classified elsewhere: Principal | ICD-10-CM

## 2017-05-09 DIAGNOSIS — C61 Malignant neoplasm of prostate: Secondary | ICD-10-CM

## 2017-05-09 MED FILL — GABAPENTIN 300 MG CAPSULE: 300 | 30 days supply | Qty: 120 | Fill #0

## 2017-05-09 MED FILL — METFORMIN HCL ER 500 MG TAB: 500 | 90 days supply | Qty: 180 | Fill #0

## 2017-05-09 NOTE — Telephone Encounter (Signed)
SW Leah in [place] re patients Axuium PET scan. Unable to sch appt at this time due to needing clarification from Dr Ilda Foil as to which scan would best benefit the patient since is currently getting lutathera administrations. I should expect a call next week on the progress.

## 2017-05-15 ENCOUNTER — Ambulatory Visit: Payer: Medicare Other

## 2017-05-15 ENCOUNTER — Ambulatory Visit (HOSPITAL_COMMUNITY)
Admission: RE | Admit: 2017-05-15 | Discharge: 2017-05-15 | Disposition: A | Discharge status: Home / Self Care | Payer: Medicare Other | Source: Ambulatory Visit | Attending: Hematology & Oncology | Admitting: Hematology & Oncology

## 2017-05-15 DIAGNOSIS — C7A8 Other malignant neuroendocrine tumors: Secondary | ICD-10-CM | POA: Diagnosis not present

## 2017-05-15 DIAGNOSIS — C7B8 Other secondary neuroendocrine tumors: Secondary | ICD-10-CM | POA: Diagnosis not present

## 2017-05-15 DIAGNOSIS — C7A090 Malignant carcinoid tumor of the bronchus and lung: Secondary | ICD-10-CM | POA: Diagnosis not present

## 2017-05-15 MED ORDER — LUTETIUM LU 177 DOTATATE 370 MBQ/ML IV SOLN
200.0000 | Freq: Once | INTRAVENOUS | Status: AC
  Administered 2017-05-15: 207 via INTRAVENOUS

## 2017-05-15 MED ORDER — OCTREOTIDE ACETATE 500 MCG/ML IJ SOLN
INTRAMUSCULAR | Status: AC
  Filled 2017-05-15: qty 1

## 2017-05-15 MED ORDER — SODIUM CHLORIDE 0.9 % IV SOLN: 500.0000 mL | Freq: Once | INTRAVENOUS | Status: DC

## 2017-05-15 MED ORDER — OCTREOTIDE ACETATE 30 MG IM KIT
30.0000 mg | PACK | Freq: Once | INTRAMUSCULAR | Status: AC
  Administered 2017-05-15: 30 mg via INTRAMUSCULAR

## 2017-05-15 MED ORDER — OCTREOTIDE ACETATE 500 MCG/ML IJ SOLN: 500.0000 ug | Freq: Once | INTRAMUSCULAR | Status: DC | PRN

## 2017-05-15 MED ORDER — SODIUM CHLORIDE 0.9 % IV SOLN
8.0000 mg | Freq: Once | INTRAVENOUS | Status: AC
  Administered 2017-05-15: 8 mg via INTRAVENOUS
  Filled 2017-05-15: qty 4

## 2017-05-15 MED ORDER — ONDANSETRON HCL 8 MG PO TABS: 8.0000 mg | ORAL_TABLET | Freq: Two times a day (BID) | ORAL | 0 refills | Status: DC | PRN

## 2017-05-15 MED ORDER — OCTREOTIDE ACETATE 30 MG IM KIT
PACK | INTRAMUSCULAR | Status: AC
  Filled 2017-05-15: qty 1

## 2017-05-15 MED ORDER — AMINO ACID RADIOPROTECTANT - L-LYSINE 2.5%/L-ARGININE 2.5% IN NS
250.0000 mL/h | INTRAVENOUS | Status: AC
  Administered 2017-05-15: 250 mL/h via INTRAVENOUS
  Filled 2017-05-15: qty 1000

## 2017-05-15 NOTE — Progress Notes (Signed)
Diagnosis: [Metastatic neuroendocrine tumor.]  Current Infusion: [3]  Planned Infusions: [4]  [206] mCi Lu 177 DOTATATE   Patient presented to nuclear medicine for treatment. No complaints in the interval.  The patient's most recent blood counts were reviewed and remains a good candidate to proceed with Lutathera. The patient was situated in an infusion suite and administered Lutathera as above. Patient will follow-up with referring oncologist for interval serum laboratories.  Patient received 30 mg IM long-acting Sandostatin injection 4 hours after Lutathera effusion in the nuclear medicine department.   Asssement/Plan:  [Third] EM 336 [id number] treatment for metastatic neuroendocrine tumor. The patient tolerated the infusion well. The patient will return in 8 to 10 weeks for ongoing care.

## 2017-05-23 ENCOUNTER — Other Ambulatory Visit: Payer: Self-pay | Admitting: Hematology & Oncology

## 2017-05-23 MED FILL — EZETIMIBE 10 MG TABLET: 10 | 90 days supply | Qty: 90 | Fill #1

## 2017-05-23 MED FILL — VESIcare 5 MG TABS: 5 | 30 days supply | Qty: 30 | Fill #0

## 2017-06-07 ENCOUNTER — Inpatient Hospital Stay: Payer: Medicare Other | Attending: Hematology & Oncology | Admitting: Hematology & Oncology

## 2017-06-07 ENCOUNTER — Inpatient Hospital Stay: Payer: Medicare Other

## 2017-06-07 ENCOUNTER — Other Ambulatory Visit: Payer: Self-pay

## 2017-06-07 ENCOUNTER — Encounter: Payer: Self-pay | Admitting: Hematology & Oncology

## 2017-06-07 VITALS — BP 133/79 | HR 73 | Temp 97.8°F | Resp 18 | Wt 153.0 lb

## 2017-06-07 DIAGNOSIS — C7951 Secondary malignant neoplasm of bone: Secondary | ICD-10-CM | POA: Diagnosis not present

## 2017-06-07 DIAGNOSIS — C61 Malignant neoplasm of prostate: Secondary | ICD-10-CM | POA: Diagnosis not present

## 2017-06-07 DIAGNOSIS — C7A8 Other malignant neuroendocrine tumors: Secondary | ICD-10-CM

## 2017-06-07 DIAGNOSIS — E119 Type 2 diabetes mellitus without complications: Secondary | ICD-10-CM | POA: Diagnosis not present

## 2017-06-07 DIAGNOSIS — Z7982 Long term (current) use of aspirin: Secondary | ICD-10-CM | POA: Diagnosis not present

## 2017-06-07 DIAGNOSIS — Z79899 Other long term (current) drug therapy: Secondary | ICD-10-CM | POA: Insufficient documentation

## 2017-06-07 DIAGNOSIS — Z7984 Long term (current) use of oral hypoglycemic drugs: Secondary | ICD-10-CM | POA: Insufficient documentation

## 2017-06-07 LAB — CMP (CANCER CENTER ONLY)
ALT: 27 U/L (ref 10–47)
AST: 23 U/L (ref 11–38)
Albumin: 3.7 g/dL (ref 3.5–5.0)
Alkaline Phosphatase: 70 U/L (ref 26–84)
Anion gap: 8 (ref 5–15)
BUN: 21 mg/dL (ref 7–22)
CO2: 30 mmol/L (ref 18–33)
Calcium: 9.4 mg/dL (ref 8.0–10.3)
Chloride: 102 mmol/L (ref 98–108)
Creatinine: 1.4 mg/dL — ABNORMAL HIGH (ref 0.60–1.20)
Glucose, Bld: 65 mg/dL — ABNORMAL LOW (ref 73–118)
Potassium: 4.1 mmol/L (ref 3.3–4.7)
Sodium: 140 mmol/L (ref 128–145)
Total Bilirubin: 0.6 mg/dL (ref 0.2–1.6)
Total Protein: 7 g/dL (ref 6.4–8.1)

## 2017-06-07 LAB — CBC WITH DIFFERENTIAL (CANCER CENTER ONLY)
Basophils Absolute: 0.1 10*3/uL (ref 0.0–0.1)
Basophils Relative: 1 %
Eosinophils Absolute: 0.6 10*3/uL — ABNORMAL HIGH (ref 0.0–0.5)
Eosinophils Relative: 7 %
HCT: 37.2 % — ABNORMAL LOW (ref 38.7–49.9)
Hemoglobin: 12.1 g/dL — ABNORMAL LOW (ref 13.0–17.1)
Lymphocytes Relative: 6 %
Lymphs Abs: 0.5 10*3/uL — ABNORMAL LOW (ref 0.9–3.3)
MCH: 33.2 pg (ref 28.0–33.4)
MCHC: 32.5 g/dL (ref 32.0–35.9)
MCV: 101.9 fL — ABNORMAL HIGH (ref 82.0–98.0)
Monocytes Absolute: 0.7 10*3/uL (ref 0.1–0.9)
Monocytes Relative: 9 %
Neutro Abs: 6 10*3/uL (ref 1.5–6.5)
Neutrophils Relative %: 77 %
Platelet Count: 225 10*3/uL (ref 145–400)
RBC: 3.65 MIL/uL — ABNORMAL LOW (ref 4.20–5.70)
RDW: 12.9 % (ref 11.1–15.7)
WBC Count: 7.8 10*3/uL (ref 4.0–10.0)

## 2017-06-07 MED FILL — GABAPENTIN 300 MG CAPSULE: 300 | 30 days supply | Qty: 120 | Fill #1

## 2017-06-07 MED FILL — predniSONE 5 MG TABS: 5 | 60 days supply | Qty: 60 | Fill #1

## 2017-06-07 NOTE — Progress Notes (Signed)
Hematology and Oncology Follow Up Visit  [name] [id number] 01/31/1945 72 y.o. 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 is back for followup.  He really is doing nicely.  He is more functional.  I am just happy that he is able to have a better quality of life.  He has had no cough or shortness of breath.  His appetite is good.  His blood sugars are being monitored.  Her graph his blood pressure was little bit low today.  I checked it with my own blood pressure cuff.  I got his blood pressure at 100/60.  I think he sees his family doctor on Monday.  He gets his last Lutathera on June 26.  His chromogranin A has always been normal.  Overall, his performance status is ECOG 1.   Medications:  Current Outpatient Medications:  .  acetaminophen (TYLENOL) 500 MG tablet, Take 1,000 mg by mouth every 4 (four) hours as needed for moderate pain., Disp: , Rfl:  .  aspirin 81 MG tablet, Take 81 mg by mouth daily., Disp: , Rfl:  .  Calcium Carbonate-Vitamin D (CALCIUM + D PO), Take 770 mg by mouth 2 (two) times daily. , Disp: , Rfl:  .  CALCIUM-IRON-VIT D-VIT K PO, Take 1 tablet by mouth 2 (two) times daily., Disp: , Rfl:  .  Eluxadoline (VIBERZI) 75 MG TABS, Take 75 mg 2 (two) times daily by mouth., Disp: , Rfl:  .  gabapentin (NEURONTIN) 300 MG capsule, TAKE 1 CAPSULE BY MOUTH TWICE A DAY THEN TAKE 2 CAPSULES AT BEDTIME, Disp: 120 capsule, Rfl: 4 .  HYDROmorphone (DILAUDID) 4 MG tablet,  Take by mouth every 4 (four) hours as needed for severe pain., Disp: , Rfl:  .  isosorbide mononitrate (IMDUR) 30 MG 24 hr tablet, Take 30 mg by mouth daily., Disp: , Rfl:  .  Lutetium Lu 177 Dotatate (LUTATHERA IV), Inject into the vein., Disp: , Rfl:  .  metFORMIN (GLUCOPHAGE-XR) 500 MG 24 hr tablet, TAKE TWO TABLETS BY MOUTH DAILY WITH BREAKFAST, Disp: 180 tablet, Rfl: 2 .  ondansetron (ZOFRAN) 8 MG tablet, Take 1 tablet (8 mg total) by mouth 2 (two) times daily as needed for nausea or vomiting., Disp: 20 tablet, Rfl: 0 .  OVER THE COUNTER MEDICATION, every morning. Juice Plus -- 8 capsule of each the garden, vineyard, and orchead, Disp: , Rfl:  .  predniSONE (DELTASONE) 5 MG tablet, TAKE 1 TABLET BY MOUTH DAILY, Disp: 60 tablet, Rfl: 3 .  prochlorperazine (COMPAZINE) 10 MG tablet, Take 1 tablet (10 mg total) by mouth every 6 (six) hours as needed for nausea or vomiting., Disp: 30 tablet, Rfl: 0 .  traMADol (ULTRAM) 50 MG tablet, TAKE 1 TABLET BY MOUTH EVERY 6 HOURS AS NEEDED, Disp: 90 tablet, Rfl: 2 .  UNABLE TO FIND, Med Name: Juice Plus Omega Blend, Disp: , Rfl:  .  UNABLE TO  FIND, Med Name: Juice Plus Vineyard Blend, Garden Blend, [employer], Disp: , Rfl:  .  VESICARE 5 MG tablet, TAKE 1 TABLET BY MOUTH DAILY., Disp: 30 tablet, Rfl: 4 .  ZETIA 10 MG tablet, Take 10 mg by mouth daily. , Disp: , Rfl:  No current facility-administered medications for this visit.   Facility-Administered Medications Ordered in Other Visits:  .  octreotide (SANDOSTATIN LAR) IM injection 30 mg, 30 mg, Intramuscular, Once, Jailyn Leeson, Rudell Cobb, MD  Allergies:  Allergies  Allergen Reactions  . Codeine Nausea And Vomiting  . Hydrocodone Nausea And Vomiting    Past Medical History, Surgical history, Social history, and Family History were reviewed and updated.  Review of Systems: Review of Systems  Genitourinary: Positive for urgency.  Musculoskeletal: Positive for joint pain.  Neurological: Positive for  focal weakness.  All other systems reviewed and are negative.    Physical Exam:  weight is 153 lb (69.4 kg). His oral temperature is 97.8 F (36.6 C). His blood pressure is 133/79 and his pulse is 73. His respiration is 18 and oxygen saturation is 100%.   Physical Exam  Constitutional: He is oriented to person, place, and time.  HENT:  Head: Normocephalic and atraumatic.  Mouth/Throat: Oropharynx is clear and moist.  Eyes: Pupils are equal, round, and reactive to light. EOM are normal.  Neck: Normal range of motion.  Cardiovascular: Normal rate, regular rhythm and normal heart sounds.  Pulmonary/Chest: Effort normal and breath sounds normal.  Abdominal: Soft. Bowel sounds are normal.  Musculoskeletal: Normal range of motion. He exhibits no edema, tenderness or deformity.  Lymphadenopathy:    He has no cervical adenopathy.  Neurological: He is alert and oriented to person, place, and time.  Skin: Skin is warm and dry. No rash noted. No erythema.  Psychiatric: He has a normal mood and affect. His behavior is normal. Judgment and thought content normal.  Vitals reviewed.    Lab Results  Component Value Date   WBC 7.8 06/07/2017   HGB 12.1 (L) 06/07/2017   HCT 37.2 (L) 06/07/2017   MCV 101.9 (H) 06/07/2017   PLT 225 06/07/2017     Chemistry      Component Value Date/Time   NA 140 06/07/2017 0949   NA 144 12/14/2016 1159   NA 140 10/22/2016 1129   K 4.1 06/07/2017 0949   K 3.5 12/14/2016 1159   K 4.3 10/22/2016 1129   CL 102 06/07/2017 0949   CL 103 12/14/2016 1159   CO2 30 06/07/2017 0949   CO2 30 12/14/2016 1159   CO2 27 10/22/2016 1129   BUN 21 06/07/2017 0949   BUN 16 12/14/2016 1159   BUN 14.0 10/22/2016 1129   CREATININE 1.40 (H) 06/07/2017 0949   CREATININE 1.3 (H) 12/14/2016 1159   CREATININE 1.1 10/22/2016 1129      Component Value Date/Time   CALCIUM 9.4 06/07/2017 0949   CALCIUM 10.0 12/14/2016 1159   CALCIUM 10.4 10/22/2016 1129   ALKPHOS 70  06/07/2017 0949   ALKPHOS 48 12/14/2016 1159   ALKPHOS 60 10/22/2016 1129   AST 23 06/07/2017 0949   AST 16 10/22/2016 1129   ALT 27 06/07/2017 0949   ALT 23 12/14/2016 1159   ALT 14 10/22/2016 1129   BILITOT 0.6 06/07/2017 0949   BILITOT 0.31 10/22/2016 1129         Impression and Plan: [name] is 72 year old gentleman with metastatic prostate cancer.  This is actually a neuroendocrine variant.  I  think that his prostate cancer must have dedifferentiated a little bit.  He has 1 more treatment of Lutathera.  I will plan to see him back in 7 weeks..  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  He will receive his Lupron and  Xgeva when we see him back.    Overall, I am just glad that he seems to be improving.  I spent about 30 minutes with he and his wife.  Over 50% of the time was spent face-to-face.  I reviewed his labs.  I went over my recommendations.  We talked about our plans for monitoring after the Lutathera is finished.    [name], MD 5/24/201910:58 AM

## 2017-06-08 LAB — PROSTATE-SPECIFIC AG, SERUM (LABCORP): Prostate Specific Ag, Serum: <0.1 ng/mL (ref 0.0–4.0)

## 2017-06-11 LAB — CHROMOGRANIN A: Chromogranin A: 1 nmol/L (ref 0–5)

## 2017-07-08 MED FILL — GABAPENTIN 300 MG CAPSULE: 300 | 30 days supply | Qty: 120 | Fill #2

## 2017-07-10 ENCOUNTER — Encounter (HOSPITAL_COMMUNITY)
Admission: RE | Admit: 2017-07-10 | Discharge: 2017-07-10 | Disposition: A | Discharge status: Home / Self Care | Payer: Medicare Other | Source: Ambulatory Visit | Attending: Hematology & Oncology | Admitting: Hematology & Oncology

## 2017-07-10 ENCOUNTER — Other Ambulatory Visit (HOSPITAL_COMMUNITY): Payer: Medicare Other

## 2017-07-10 ENCOUNTER — Ambulatory Visit: Payer: Medicare Other

## 2017-07-10 DIAGNOSIS — C7A8 Other malignant neuroendocrine tumors: Secondary | ICD-10-CM | POA: Diagnosis not present

## 2017-07-10 DIAGNOSIS — C7B8 Other secondary neuroendocrine tumors: Secondary | ICD-10-CM | POA: Insufficient documentation

## 2017-07-10 LAB — CBC WITH DIFFERENTIAL/PLATELET
Basophils Absolute: 0 10*3/uL (ref 0.0–0.1)
Basophils Relative: 0 %
Eosinophils Absolute: 0.1 10*3/uL (ref 0.0–0.7)
Eosinophils Relative: 1 %
HCT: 36.7 % — ABNORMAL LOW (ref 39.0–52.0)
Hemoglobin: 11.8 g/dL — ABNORMAL LOW (ref 13.0–17.0)
Lymphocytes Relative: 6 %
Lymphs Abs: 0.7 10*3/uL (ref 0.7–4.0)
MCH: 32.5 pg (ref 26.0–34.0)
MCHC: 32.2 g/dL (ref 30.0–36.0)
MCV: 101.1 fL — ABNORMAL HIGH (ref 78.0–100.0)
Monocytes Absolute: 0.6 10*3/uL (ref 0.1–1.0)
Monocytes Relative: 5 %
Neutro Abs: 10.1 10*3/uL — ABNORMAL HIGH (ref 1.7–7.7)
Neutrophils Relative %: 88 %
Platelets: 285 10*3/uL (ref 150–400)
RBC: 3.63 MIL/uL — ABNORMAL LOW (ref 4.22–5.81)
RDW: 12.8 % (ref 11.5–15.5)
WBC: 11.5 10*3/uL — ABNORMAL HIGH (ref 4.0–10.5)

## 2017-07-10 LAB — COMPREHENSIVE METABOLIC PANEL
ALT: 14 U/L (ref 0–44)
AST: 18 U/L (ref 15–41)
Albumin: 3.9 g/dL (ref 3.5–5.0)
Alkaline Phosphatase: 66 U/L (ref 38–126)
Anion gap: 11 (ref 5–15)
BUN: 22 mg/dL (ref 8–23)
CO2: 27 mmol/L (ref 22–32)
Calcium: 9.3 mg/dL (ref 8.9–10.3)
Chloride: 101 mmol/L (ref 98–111)
Creatinine, Ser: 1.34 mg/dL — ABNORMAL HIGH (ref 0.61–1.24)
GFR calc Af Amer: >60 mL/min (ref >60)
GFR calc non Af Amer: 52 mL/min — ABNORMAL LOW (ref >60)
Glucose, Bld: 130 mg/dL — ABNORMAL HIGH (ref 70–99)
Potassium: 3.8 mmol/L (ref 3.5–5.1)
Sodium: 139 mmol/L (ref 135–145)
Total Bilirubin: 0.4 mg/dL (ref 0.3–1.2)
Total Protein: 7.3 g/dL (ref 6.5–8.1)

## 2017-07-10 MED ORDER — OCTREOTIDE ACETATE 500 MCG/ML IJ SOLN: 500.0000 ug | Freq: Once | INTRAMUSCULAR | Status: DC | PRN

## 2017-07-10 MED ORDER — OCTREOTIDE ACETATE 30 MG IM KIT
30.0000 mg | PACK | Freq: Once | INTRAMUSCULAR | Status: AC
  Administered 2017-07-10: 30 mg via INTRAMUSCULAR

## 2017-07-10 MED ORDER — LUTETIUM LU 177 DOTATATE 370 MBQ/ML IV SOLN
200.0000 | Freq: Once | INTRAVENOUS | Status: AC
  Administered 2017-07-10: 204.6 via INTRAVENOUS

## 2017-07-10 MED ORDER — OCTREOTIDE ACETATE 500 MCG/ML IJ SOLN
INTRAMUSCULAR | Status: AC
  Filled 2017-07-10: qty 1

## 2017-07-10 MED ORDER — SODIUM CHLORIDE 0.9 % IV SOLN
8.0000 mg | Freq: Once | INTRAVENOUS | Status: AC
  Administered 2017-07-10: 8 mg via INTRAVENOUS
  Filled 2017-07-10: qty 4

## 2017-07-10 MED ORDER — AMINO ACID RADIOPROTECTANT - L-LYSINE 2.5%/L-ARGININE 2.5% IN NS
250.0000 mL/h | INTRAVENOUS | Status: AC
  Administered 2017-07-10: 250 mL/h via INTRAVENOUS
  Filled 2017-07-10: qty 1000

## 2017-07-10 MED ORDER — PROCHLORPERAZINE EDISYLATE 10 MG/2ML IJ SOLN
10.0000 mg | Freq: Four times a day (QID) | INTRAMUSCULAR | Status: DC | PRN
  Filled 2017-07-10: qty 2

## 2017-07-10 MED ORDER — SODIUM CHLORIDE 0.9 % IV SOLN: 500.0000 mL | Freq: Once | INTRAVENOUS | Status: DC

## 2017-07-10 MED ORDER — OCTREOTIDE ACETATE 30 MG IM KIT
PACK | INTRAMUSCULAR | Status: AC
  Filled 2017-07-10: qty 1

## 2017-07-10 NOTE — Progress Notes (Signed)
RADIOPHARMACEUTICALS:  Peptide receptor radiotherapy  [204.6 mCi Lu 177 DOTATATE   Diagnosis: [Metastatic neuroendocrine tumor.]  Current Infusion: [4]  Planned Infusions: [4]  Patient presented to nuclear medicine for treatment.  Patient reports no adverse effects.  Patient reports improved energy.  The patient's most recent blood counts were reviewed and remains a good candidate to proceed with Lutathera.  Patient is  mildly anemic which is actually improved from most recent exams.  Patient's chromogranin A  and PSA are normal.    The patient was situated in an infusion suite and administered Lutathera as above. Patient will follow-up with referring oncologist for interval serum laboratories.  Patient received 30 mg IM long-acting Sandostatin injection 4 hours after Lutathera effusion in the nuclear medicine department.  IMPRESSION:  [Fourth and final] HC 623 [id number] treatment for metastatic neuroendocrine tumor. The patient tolerated the infusion well. The patient will return in 1 month for DOTATATE imaging and post therapy consult.

## 2017-07-11 ENCOUNTER — Telehealth: Payer: Self-pay | Admitting: *Deleted

## 2017-07-11 NOTE — Telephone Encounter (Signed)
Wife wanted to update the office on patient this morning.  Patient finished his last Lutathera treatment yesterday with Dr Leonia Reeves. When he got home, he had significant n/v, diarrhea and a low grade temperature. They called the MD and he was given extra zofran. This morning, the patient feels better, however Dr Leonia Reeves wanted to make sure that this office was aware of events.  Information relayed to Dr Marin Olp.

## 2017-07-25 ENCOUNTER — Ambulatory Visit (INDEPENDENT_AMBULATORY_CARE_PROVIDER_SITE_OTHER): Payer: Medicare Other | Admitting: Cardiology

## 2017-07-25 ENCOUNTER — Encounter: Payer: Self-pay | Admitting: Cardiology

## 2017-07-25 VITALS — BP 118/64 | HR 85 | Ht 68.0 in | Wt 154.2 lb

## 2017-07-25 DIAGNOSIS — C7951 Secondary malignant neoplasm of bone: Secondary | ICD-10-CM | POA: Diagnosis not present

## 2017-07-25 DIAGNOSIS — E785 Hyperlipidemia, unspecified: Secondary | ICD-10-CM

## 2017-07-25 DIAGNOSIS — I10 Essential (primary) hypertension: Secondary | ICD-10-CM

## 2017-07-25 NOTE — Progress Notes (Signed)
Cardiology Office Note:    Date:  07/25/2017   ID:  [name], DOB 18-Sep-1945, MRN [id number]  PCP:  Michael Dress, MD  Cardiologist:  Michael Campus, MD    Referring MD: Michael Dress, MD   No chief complaint on file. I am doing well cardiac wise  History of Present Illness:    [name] is a 72 y.o. male with a stress test that being done years ago showing some mild abnormality however she denies having a chest pain on top of that he got multiple additional issues including cancer with metastasis to the bone he ended up having some nerve injury because of compression by cancer of his neck going to the leg.  Denies have any chest pain tightness squeezing pressure burning chest.  Problem with dyslipidemia still ongoing he does not want to take statin because he had some family member that ended up dying taking this medication.  He takes Zetia and he seems to be tolerating this well I will call primary care physician to get fasting lipid profile.  Past Medical History:  Diagnosis Date  . Complication of anesthesia   . Constipation    due to pain medication  . 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 Dyspnea    with exertion  . Essential hypertension 07/12/2014  . High blood sugar   . Neuroendocrine carcinoma of unknown origin ([place]) 12/14/2016  . PONV (postoperative nausea and vomiting)   . Prostate cancer ([name]) 07/23/2011   with mets to sacral, lungs  . Radiation 04/22/14-05/06/14   right sacrum 30 gray  . 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 CIRCUMCISION     when patient was 72 years old  . COLONOSCOPY    . COLONOSCOPY W/ POLYPECTOMY    . ESOPHAGOGASTRODUODENOSCOPY    . HAND SURGERY Left    fracture  . LUMBAR LAMINECTOMY/DECOMPRESSION MICRODISCECTOMY Right 09/12/2016   Procedure: LAMINECTOMY AND FORAMINOTOMY RIGHT LUMBAR FIVE- SACRAL ONE,  SACRAL ONE- SACRAL TWO DECOMPRESSION OF RIGHT SACRAL ONE NERVE ROOT;  Surgeon: [name], MD;  Location: [facility];  Service: Neurosurgery;  Laterality: Right;  . ROBOT ASSISTED LAPAROSCOPIC RADICAL PROSTATECTOMY    . TONSILECTOMY/ADENOIDECTOMY WITH MYRINGOTOMY      Current Medications: Current Meds  Medication Sig  . acetaminophen (TYLENOL) 500 MG tablet Take 1,000 mg by mouth every 4 (four) hours as needed for moderate pain.  Marland Kitchen aspirin 81 MG tablet Take 81 mg by mouth daily.  . Calcium Carbonate-Vitamin D (CALCIUM + D PO) Take 770 mg by mouth 2 (two) times daily.   Marland Kitchen CALCIUM-IRON-VIT D-VIT K PO Take 1 tablet by mouth 2 (two) times daily.  Marland Kitchen gabapentin (NEURONTIN) 300 MG capsule TAKE 1 CAPSULE BY MOUTH TWICE A DAY THEN TAKE 2 CAPSULES AT BEDTIME  . metFORMIN (GLUCOPHAGE-XR) 500 MG 24 hr tablet TAKE TWO TABLETS BY MOUTH DAILY WITH BREAKFAST  . ondansetron (ZOFRAN) 8 MG tablet Take 1 tablet (8 mg total) by mouth 2 (two) times daily as needed for nausea or vomiting.  Marland Kitchen OVER THE COUNTER MEDICATION every morning. Juice Plus -- 8 capsule of each the garden, vineyard, and orchead  . predniSONE (DELTASONE) 5 MG tablet TAKE 1 TABLET BY MOUTH DAILY  . prochlorperazine (COMPAZINE) 10 MG tablet Take 1 tablet (10 mg total) by mouth every 6 (six) hours as needed for nausea or vomiting.  Marland Kitchen  traMADol (ULTRAM) 50 MG tablet TAKE 1 TABLET BY MOUTH EVERY 6 HOURS AS NEEDED  . UNABLE TO FIND Med Name: Juice Plus Omega Blend  . UNABLE TO FIND Med Name: Juice Plus [address], [place], [employer]  . VESICARE 5 MG tablet TAKE 1 TABLET BY MOUTH DAILY.  Marland Kitchen ZETIA 10 MG tablet Take 10 mg by mouth daily.      Allergies:   Codeine and Hydrocodone   Social History   Socioeconomic History  . Marital status: Married    Spouse name: Not on file  . Number of children: 2  . Years of education: Not on file  . Highest education level: Not on file  Occupational History  . Occupation: real estate aprais.     Employer: [employer]  Social Needs  . Financial resource strain: Not on file  . Food insecurity:    Worry: Not on file    Inability: Not on file  . Transportation needs:    Medical: Not on file    Non-medical: Not on file  Tobacco Use  . Smoking status: Never Smoker  . Smokeless tobacco: Never Used  . Tobacco comment: never used tobacco  Substance and Sexual Activity  . Alcohol use: No    Alcohol/week: 0.0 oz  . Drug use: No  . Sexual activity: Not on file  Lifestyle  . Physical activity:    Days per week: Not on file    Minutes per session: Not on file  . Stress: Not on file  Relationships  . Social connections:    Talks on phone: Not on file    Gets together: Not on file    Attends religious service: Not on file    Active member of club or organization: Not on file    Attends meetings of clubs or organizations: Not on file    Relationship status: Not on file  Other Topics Concern  . Not on file  Social History Narrative  . Not on file     Family History: The patient's family history includes Heart disease in his father and mother; Prostate cancer in his father. ROS:   Please see the history of present illness.    All 14 point review of systems negative except as described per history of present illness  EKGs/Labs/Other Studies Reviewed:    EKG done today showed normal sinus rhythm normal P interval possibility of inferior wall microinfarction no ST segment changes   Recent Labs: 07/10/2017: ALT 14; BUN 22; Creatinine, Ser 1.34; Hemoglobin 11.8; Platelets 285; Potassium 3.8; Sodium 139  Recent Lipid Panel    Component Value Date/Time   CHOL 219 (H) 10/29/2014 0844   TRIG 120 10/29/2014 0844   HDL 51 10/29/2014 0844   CHOLHDL 4.3 10/29/2014 0844   VLDL 24 10/29/2014 0844   LDLCALC 144 (H) 10/29/2014 0844    Physical Exam:    VS:  BP 118/64 (BP Location: Right Arm, Patient Position: Sitting, Cuff Size: Normal)   Pulse 85   Ht 5\' 8"  (1.727 m)    Wt 154 lb 3.2 oz (69.9 kg)   SpO2 100%   BMI 23.45 kg/m     Wt Readings from Last 3 Encounters:  07/25/17 154 lb 3.2 oz (69.9 kg)  06/07/17 153 lb (69.4 kg)  04/19/17 150 lb (68 kg)     GEN:  Well nourished, well developed in no acute distress HEENT: Normal NECK: No JVD; No carotid bruits LYMPHATICS: No lymphadenopathy CARDIAC: RRR, no  murmurs, no rubs, no gallops RESPIRATORY:  Clear to auscultation without rales, wheezing or rhonchi  ABDOMEN: Soft, non-tender, non-distended MUSCULOSKELETAL:  No edema; No deformity  SKIN: Warm and dry LOWER EXTREMITIES: no swelling NEUROLOGIC:  Alert and oriented x 3 PSYCHIATRIC:  Normal affect   ASSESSMENT:    1. Essential hypertension   2. Cancer, metastatic to bone ([facility])   3. Metastasis to bone ([place])   4. Dyslipidemia    PLAN:    In order of problems listed above:  1. Abnormal stress test overall doing well from that point review.  Denies have any symptoms.  We will continue present management. 2. Dyslipidemia will call primary care physician to get fasting lipid profile he told me straight that he does not want to take any statin 3. Metastatic cancer to bone.  Follow-up by neurology and oncology.   Medication Adjustments/Labs and Tests Ordered: Current medicines are reviewed at length with the patient today.  Concerns regarding medicines are outlined above.  No orders of the defined types were placed in this encounter.  Medication changes: No orders of the defined types were placed in this encounter.   Signed, Park Liter, MD, [facility] 07/25/2017 4:46 PM    [facility]

## 2017-07-25 NOTE — Patient Instructions (Signed)
Medication Instructions:  Your physician recommends that you continue on your current medications as directed. Please refer to the Current Medication list given to you today.  Labwork: None  Testing/Procedures: None  Follow-Up: Your physician recommends that you schedule a follow-up appointment in: November, 2019  Any Other Special Instructions Will Be Listed Below (If Applicable).     If you need a refill on your cardiac medications before your next appointment, please call your pharmacy.   [place], RN, BSN

## 2017-07-26 ENCOUNTER — Inpatient Hospital Stay: Payer: Medicare Other

## 2017-07-26 ENCOUNTER — Inpatient Hospital Stay: Payer: Medicare Other | Attending: Hematology & Oncology | Admitting: Hematology & Oncology

## 2017-07-26 ENCOUNTER — Encounter: Payer: Self-pay | Admitting: Hematology & Oncology

## 2017-07-26 ENCOUNTER — Other Ambulatory Visit: Payer: Self-pay

## 2017-07-26 VITALS — BP 117/66 | HR 82 | Temp 98.0°F | Resp 16 | Wt 154.0 lb

## 2017-07-26 DIAGNOSIS — Z7982 Long term (current) use of aspirin: Secondary | ICD-10-CM

## 2017-07-26 DIAGNOSIS — Z5111 Encounter for antineoplastic chemotherapy: Secondary | ICD-10-CM | POA: Diagnosis not present

## 2017-07-26 DIAGNOSIS — C61 Malignant neoplasm of prostate: Secondary | ICD-10-CM | POA: Diagnosis not present

## 2017-07-26 DIAGNOSIS — E119 Type 2 diabetes mellitus without complications: Secondary | ICD-10-CM | POA: Diagnosis not present

## 2017-07-26 DIAGNOSIS — Z79899 Other long term (current) drug therapy: Secondary | ICD-10-CM | POA: Diagnosis not present

## 2017-07-26 DIAGNOSIS — Z7984 Long term (current) use of oral hypoglycemic drugs: Secondary | ICD-10-CM | POA: Diagnosis not present

## 2017-07-26 DIAGNOSIS — C7A8 Other malignant neuroendocrine tumors: Secondary | ICD-10-CM

## 2017-07-26 DIAGNOSIS — C7951 Secondary malignant neoplasm of bone: Secondary | ICD-10-CM

## 2017-07-26 LAB — CMP (CANCER CENTER ONLY)
ALT: 20 U/L (ref 10–47)
AST: 22 U/L (ref 11–38)
Albumin: 3.5 g/dL (ref 3.5–5.0)
Alkaline Phosphatase: 66 U/L (ref 26–84)
Anion gap: 7 (ref 5–15)
BUN: 20 mg/dL (ref 7–22)
CO2: 31 mmol/L (ref 18–33)
Calcium: 9.8 mg/dL (ref 8.0–10.3)
Chloride: 103 mmol/L (ref 98–108)
Creatinine: 1.1 mg/dL (ref 0.60–1.20)
Glucose, Bld: 161 mg/dL — ABNORMAL HIGH (ref 73–118)
Potassium: 3.9 mmol/L (ref 3.3–4.7)
Sodium: 141 mmol/L (ref 128–145)
Total Bilirubin: 0.5 mg/dL (ref 0.2–1.6)
Total Protein: 7.2 g/dL (ref 6.4–8.1)

## 2017-07-26 LAB — CBC WITH DIFFERENTIAL (CANCER CENTER ONLY)
Basophils Absolute: 0 10*3/uL (ref 0.0–0.1)
Basophils Relative: 1 %
Eosinophils Absolute: 0.1 10*3/uL (ref 0.0–0.5)
Eosinophils Relative: 1 %
HCT: 37 % — ABNORMAL LOW (ref 38.7–49.9)
Hemoglobin: 11.6 g/dL — ABNORMAL LOW (ref 13.0–17.1)
Lymphocytes Relative: 5 %
Lymphs Abs: 0.3 10*3/uL — ABNORMAL LOW (ref 0.9–3.3)
MCH: 32.2 pg (ref 28.0–33.4)
MCHC: 31.4 g/dL — ABNORMAL LOW (ref 32.0–35.9)
MCV: 102.8 fL — ABNORMAL HIGH (ref 82.0–98.0)
Monocytes Absolute: 0.6 10*3/uL (ref 0.1–0.9)
Monocytes Relative: 8 %
Neutro Abs: 6.1 10*3/uL (ref 1.5–6.5)
Neutrophils Relative %: 85 %
Platelet Count: 252 10*3/uL (ref 145–400)
RBC: 3.6 MIL/uL — ABNORMAL LOW (ref 4.20–5.70)
RDW: 12.9 % (ref 11.1–15.7)
WBC Count: 7.1 10*3/uL (ref 4.0–10.0)

## 2017-07-26 MED ORDER — DENOSUMAB 120 MG/1.7ML ~~LOC~~ SOLN
120.0000 mg | Freq: Once | SUBCUTANEOUS | Status: AC
  Administered 2017-07-26: 120 mg via SUBCUTANEOUS

## 2017-07-26 MED ORDER — DENOSUMAB 120 MG/1.7ML ~~LOC~~ SOLN
SUBCUTANEOUS | Status: AC
  Filled 2017-07-26: qty 1.7

## 2017-07-26 MED ORDER — LEUPROLIDE ACETATE (3 MONTH) 22.5 MG IM KIT
22.5000 mg | PACK | Freq: Once | INTRAMUSCULAR | Status: AC
  Administered 2017-07-26: 22.5 mg via INTRAMUSCULAR
  Filled 2017-07-26: qty 22.5

## 2017-07-26 NOTE — Progress Notes (Signed)
Hematology and Oncology Follow Up Visit  [name] [id number] Jun 02, 1945 72 y.o. 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  He does look good.  He had his last dose of Lutathera a couple weeks ago.  He did have some vomiting afterwards.  This lasted for about 3 hours.  I suspect that the vomiting was probably from the amino acid solution that they give intravenously.  He will have a CT scan follow-up in late July.  He overall is doing well.  He is getting stronger with his right leg.  He has a brace on the lower leg.  Has had no fever.  He has had no bleeding.  There is been no rashes.  He has had no pruritus.    His performance status is ECOG 1.   Medications:  Current Outpatient Medications:  .  acetaminophen (TYLENOL) 500 MG tablet, Take 1,000 mg by mouth every 4 (four) hours as needed for moderate pain., Disp: , Rfl:  .  aspirin 81 MG tablet, Take 81 mg by mouth daily., Disp: , Rfl:  .  Calcium Carbonate-Vitamin D (CALCIUM + D PO), Take 770 mg by mouth 2 (two) times daily. , Disp: , Rfl:  .  CALCIUM-IRON-VIT D-VIT K PO, Take 1 tablet by mouth 2 (two) times daily., Disp: , Rfl:  .  gabapentin (NEURONTIN) 300 MG capsule, TAKE 1 CAPSULE BY MOUTH TWICE A DAY THEN TAKE 2 CAPSULES AT BEDTIME, Disp: 120 capsule, Rfl: 4 .  metFORMIN (GLUCOPHAGE-XR) 500 MG 24 hr tablet, TAKE TWO TABLETS BY MOUTH DAILY WITH BREAKFAST, Disp: 180 tablet, Rfl:  2 .  ondansetron (ZOFRAN) 8 MG tablet, Take 1 tablet (8 mg total) by mouth 2 (two) times daily as needed for nausea or vomiting., Disp: 20 tablet, Rfl: 0 .  OVER THE COUNTER MEDICATION, every morning. Juice Plus -- 8 capsule of each the garden, vineyard, and orchead, Disp: , Rfl:  .  predniSONE (DELTASONE) 5 MG tablet, TAKE 1 TABLET BY MOUTH DAILY, Disp: 60 tablet, Rfl: 3 .  prochlorperazine (COMPAZINE) 10 MG tablet, Take 1 tablet (10 mg total) by mouth every 6 (six) hours as needed for nausea or vomiting., Disp: 30 tablet, Rfl: 0 .  traMADol (ULTRAM) 50 MG tablet, TAKE 1 TABLET BY MOUTH EVERY 6 HOURS AS NEEDED, Disp: 90 tablet, Rfl: 2 .  UNABLE TO FIND, Med Name: Juice Plus Omega Blend, Disp: , Rfl:  .  UNABLE TO FIND, Med Name: Juice Plus [employer], Garden Blend, [employer], Disp: , Rfl:  .  VESICARE 5 MG tablet, TAKE 1 TABLET BY MOUTH DAILY., Disp: 30 tablet, Rfl: 4 .  ZETIA 10 MG tablet, Take 10 mg by mouth daily. , Disp: , Rfl:  No current facility-administered medications for this visit.   Facility-Administered Medications Ordered in Other Visits:  .  denosumab (XGEVA) injection 120 mg, 120 mg, Subcutaneous, Once, Amity Roes R, MD .  leuprolide (LUPRON) injection 22.5 mg, 22.5 mg, Intramuscular, Once, Byanca Kasper R, MD .  octreotide (SANDOSTATIN LAR) IM injection 30 mg, 30 mg, Intramuscular, Once, Michaelyn Wall, Rudell Cobb, MD  Allergies:  Allergies  Allergen Reactions  . Codeine Nausea And Vomiting  . Hydrocodone Nausea And Vomiting    Past Medical History, Surgical history, Social history, and Family History were reviewed and updated.  Review of Systems: Review of Systems  Genitourinary: Positive for urgency.  Musculoskeletal: Positive for joint pain.  Neurological: Positive for focal weakness.  All other systems reviewed and are negative.    Physical Exam:  weight is 154 lb (69.9 kg). His oral temperature is 98 F (36.7 C). His blood pressure is 117/66 and his  pulse is 82. His respiration is 16 and oxygen saturation is 100%.   Physical Exam  Constitutional: He is oriented to person, place, and time.  HENT:  Head: Normocephalic and atraumatic.  Mouth/Throat: Oropharynx is clear and moist.  Eyes: Pupils are equal, round, and reactive to light. EOM are normal.  Neck: Normal range of motion.  Cardiovascular: Normal rate, regular rhythm and normal heart sounds.  Pulmonary/Chest: Effort normal and breath sounds normal.  Abdominal: Soft. Bowel sounds are normal.  Musculoskeletal: Normal range of motion. He exhibits no edema, tenderness or deformity.  Lymphadenopathy:    He has no cervical adenopathy.  Neurological: He is alert and oriented to person, place, and time.  Skin: Skin is warm and dry. No rash noted. No erythema.  Psychiatric: He has a normal mood and affect. His behavior is normal. Judgment and thought content normal.  Vitals reviewed.    Lab Results  Component Value Date   WBC 7.1 07/26/2017   HGB 11.6 (L) 07/26/2017   HCT 37.0 (L) 07/26/2017   MCV 102.8 (H) 07/26/2017   PLT 252 07/26/2017     Chemistry      Component Value Date/Time   NA 141 07/26/2017 1028   NA 144 12/14/2016 1159   NA 140 10/22/2016 1129   K 3.9 07/26/2017 1028   K 3.5 12/14/2016 1159   K 4.3 10/22/2016 1129   CL 103 07/26/2017 1028   CL 103 12/14/2016 1159   CO2 31 07/26/2017 1028   CO2 30 12/14/2016 1159   CO2 27 10/22/2016 1129   BUN 20 07/26/2017 1028   BUN 16 12/14/2016 1159   BUN 14.0 10/22/2016 1129   CREATININE 1.10 07/26/2017 1028   CREATININE 1.3 (H) 12/14/2016 1159   CREATININE 1.1 10/22/2016 1129      Component Value Date/Time   CALCIUM 9.8 07/26/2017 1028   CALCIUM 10.0 12/14/2016 1159   CALCIUM 10.4 10/22/2016 1129   ALKPHOS 66 07/26/2017 1028   ALKPHOS 48 12/14/2016 1159   ALKPHOS 60 10/22/2016 1129   AST 22 07/26/2017 1028   AST 16 10/22/2016 1129   ALT 20 07/26/2017 1028   ALT 23 12/14/2016 1159   ALT 14 10/22/2016 1129    BILITOT 0.5 07/26/2017 1028   BILITOT 0.31 10/22/2016 1129         Impression and Plan: [name] is 72 year old gentleman with metastatic prostate cancer.  This is actually a neuroendocrine variant.  I think that his prostate cancer must have dedifferentiated a little bit.  Hopefully, we will see that he has had a good response via the CT scan.  For right now, we will plan to get him back  to see [place] in about 4 weeks or so.  I want to see him back after he sees the radiologist.   [name], MD 7/12/201912:09 PM

## 2017-07-27 LAB — PROSTATE-SPECIFIC AG, SERUM (LABCORP): Prostate Specific Ag, Serum: <0.1 ng/mL (ref 0.0–4.0)

## 2017-07-29 LAB — CHROMOGRANIN A: Chromogranin A: <1 nmol/L (ref 0–5)

## 2017-08-07 ENCOUNTER — Encounter (HOSPITAL_COMMUNITY)
Admission: RE | Admit: 2017-08-07 | Discharge: 2017-08-07 | Disposition: A | Discharge status: Home / Self Care | Payer: Medicare Other | Source: Ambulatory Visit | Attending: Hematology & Oncology | Admitting: Hematology & Oncology

## 2017-08-07 ENCOUNTER — Telehealth: Payer: Self-pay | Admitting: *Deleted

## 2017-08-07 DIAGNOSIS — C7A8 Other malignant neuroendocrine tumors: Secondary | ICD-10-CM | POA: Insufficient documentation

## 2017-08-07 DIAGNOSIS — C7B09 Secondary carcinoid tumors of other sites: Secondary | ICD-10-CM | POA: Diagnosis not present

## 2017-08-07 MED ORDER — GALLIUM GA 68 DOTATATE IV KIT
3.8000 | PACK | Freq: Once | INTRAVENOUS | Status: AC
  Administered 2017-08-07: 3.8 via INTRAVENOUS

## 2017-08-07 MED FILL — METFORMIN HCL ER 500 MG TAB: 500 | 90 days supply | Qty: 180 | Fill #1

## 2017-08-07 MED FILL — predniSONE 5 MG TABS: 5 | 60 days supply | Qty: 60 | Fill #2

## 2017-08-07 MED FILL — GABAPENTIN 300 MG CAPSULE: 300 | 30 days supply | Qty: 120 | Fill #3

## 2017-08-07 NOTE — Telephone Encounter (Addendum)
Patient is aware of results   ----- Message from [name], MD sent at 08/07/2017  2:09 PM EDT ----- Call - FANTASTIC response to [place]!!!!!  Great job!!  Laurey Arrow

## 2017-08-08 DIAGNOSIS — Z1331 Encounter for screening for depression: Secondary | ICD-10-CM | POA: Diagnosis not present

## 2017-08-08 DIAGNOSIS — Z0000 Encounter for general adult medical examination without abnormal findings: Secondary | ICD-10-CM | POA: Diagnosis not present

## 2017-08-08 DIAGNOSIS — Z9181 History of falling: Secondary | ICD-10-CM | POA: Diagnosis not present

## 2017-08-08 DIAGNOSIS — Z125 Encounter for screening for malignant neoplasm of prostate: Secondary | ICD-10-CM | POA: Diagnosis not present

## 2017-08-08 DIAGNOSIS — E785 Hyperlipidemia, unspecified: Secondary | ICD-10-CM | POA: Diagnosis not present

## 2017-08-08 DIAGNOSIS — Z1211 Encounter for screening for malignant neoplasm of colon: Secondary | ICD-10-CM | POA: Diagnosis not present

## 2017-08-14 ENCOUNTER — Ambulatory Visit (HOSPITAL_COMMUNITY)
Admission: RE | Admit: 2017-08-14 | Discharge: 2017-08-14 | Disposition: A | Discharge status: Home / Self Care | Payer: Medicare Other | Source: Ambulatory Visit | Attending: Diagnostic Radiology | Admitting: Diagnostic Radiology

## 2017-08-14 ENCOUNTER — Other Ambulatory Visit (HOSPITAL_COMMUNITY): Payer: Medicare Other

## 2017-08-14 DIAGNOSIS — C7B09 Secondary carcinoid tumors of other sites: Secondary | ICD-10-CM | POA: Diagnosis not present

## 2017-08-14 DIAGNOSIS — C7A8 Other malignant neuroendocrine tumors: Secondary | ICD-10-CM

## 2017-08-14 NOTE — Consult Note (Signed)
Chief Complaint: Patient with metastatic neuroendocrine tumor post completion of 4 cycles Peptide receptor radiotherapy (PRRT) with GT364 DOTATATE (Lutathera).  Referring Physician(s):Ennever   Patient Status: [facility] - Out-pt  History of Present Illness: [name] is a very pleasant 72 year old male who presents for evaluation for PRRT Lu 177 DOTATATE radiotherapy for metastatic neuroendocrine tumor. Patient accompanied by white and sister.   Patient diagnosed with prostate cancer 2013 (Gleason 3+ 4 equals 7).  Patient subsequently developed pelvic sacral lesion which was treated with radiotherapy due to sciatic pain as well as biopsied. On biopsy, lesion was determined neuroendocrine, lung origin suspected. Patient additionally has bilateral pulmonary nodules which are enlarging. Nodules were avid for the neuroendocrine tumor specific radiotracer gallium 19 DOTATATE on  PET CT.    Patient is currently on maintenance prostate cancer therapy of Lupron and Xgeva.   In the past patient has had radium 223 therapy and sacral radiation therapy.   Patient complains of some symptoms which may be related to neuroendocrine tumor including diarrhea, flushing, and sweating.  Patient also reports some mild urinary incontinence for which he wears depends garments.  Patient not receiving Sandostatin injections    Past Medical History:  Diagnosis Date  . Complication of anesthesia   . Constipation    due to pain medication  . 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 Dyspnea    with exertion  . Essential hypertension 07/12/2014  . High blood sugar   . Neuroendocrine carcinoma of unknown origin ([place]) 12/14/2016  . PONV (postoperative nausea and vomiting)   . Prostate cancer ([place]) 07/23/2011   with mets to sacral, lungs  . Radiation 04/22/14-05/06/14   right sacrum 30 gray  . 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 CIRCUMCISION     when patient was 71 years old  . COLONOSCOPY    . COLONOSCOPY W/ POLYPECTOMY    . ESOPHAGOGASTRODUODENOSCOPY    . HAND SURGERY Left    fracture  . LUMBAR LAMINECTOMY/DECOMPRESSION MICRODISCECTOMY Right 09/12/2016   Procedure: LAMINECTOMY AND FORAMINOTOMY RIGHT LUMBAR FIVE- SACRAL ONE, SACRAL ONE- SACRAL TWO DECOMPRESSION OF RIGHT SACRAL ONE NERVE ROOT;  Surgeon: [name], MD;  Location: [place];  Service: Neurosurgery;  Laterality: Right;  . ROBOT ASSISTED LAPAROSCOPIC RADICAL PROSTATECTOMY    . TONSILECTOMY/ADENOIDECTOMY WITH MYRINGOTOMY      Allergies: Codeine and Hydrocodone  Medications: Prior to Admission medications   Medication Sig Start Date End Date Taking? Authorizing Provider  acetaminophen (TYLENOL) 500 MG tablet Take 1,000 mg by mouth every 4 (four) hours as needed for moderate pain.    [provider]  aspirin 81 MG tablet Take 81 mg by mouth daily.    [provider]  Calcium Carbonate-Vitamin D (CALCIUM + D PO) Take 770 mg by mouth 2 (two) times daily.     [provider]  CALCIUM-IRON-VIT D-VIT K PO Take 1 tablet by mouth 2 (two) times daily.    [provider]  gabapentin (NEURONTIN) 300 MG capsule TAKE 1 CAPSULE BY MOUTH TWICE A DAY THEN TAKE 2 CAPSULES AT BEDTIME 05/09/17   [place], Holli Humbles, NP  metFORMIN (GLUCOPHAGE-XR) 500 MG 24 hr tablet TAKE TWO TABLETS BY MOUTH DAILY WITH BREAKFAST 05/09/17   [name], MD  ondansetron (ZOFRAN) 8 MG tablet Take 1 tablet (8 mg total) by mouth 2 (two) times daily as  needed for nausea or vomiting. 05/15/17   Gus Height, MD  OVER THE COUNTER MEDICATION every morning. Juice Plus -- 8 capsule of each the garden, vineyard, and Publishing copy, Historical, MD  predniSONE (DELTASONE) 5 MG tablet TAKE 1 TABLET BY MOUTH DAILY 04/05/17   [place], Holli Humbles, NP  prochlorperazine (COMPAZINE) 10 MG tablet Take 1 tablet (10 mg  total) by mouth every 6 (six) hours as needed for nausea or vomiting. 09/22/15   [name], MD  traMADol (ULTRAM) 50 MG tablet TAKE 1 TABLET BY MOUTH EVERY 6 HOURS AS NEEDED 12/20/16   [name], MD  UNABLE TO FIND Med Name: Juice Plus Omega Blend    [provider]  UNABLE TO FIND Med Name: Juice Plus [address] Garden Blend, [employer], Historical, MD  VESICARE 5 MG tablet TAKE 1 TABLET BY MOUTH DAILY. 05/23/17   [name], MD  ZETIA 10 MG tablet Take 10 mg by mouth daily.  06/29/14   [provider]     Family History  Problem Relation Age of Onset  . Prostate cancer Father   . Heart disease Father   . Heart disease Mother     Social History   Socioeconomic History  . Marital status: Married    Spouse name: Not on file  . Number of children: 2  . Years of education: Not on file  . Highest education level: Not on file  Occupational History  . Occupation: real estate aprais.    Employer: [employer]  Social Needs  . Financial resource strain: Not on file  . Food insecurity:    Worry: Not on file    Inability: Not on file  . Transportation needs:    Medical: Not on file    Non-medical: Not on file  Tobacco Use  . Smoking status: Never Smoker  . Smokeless tobacco: Never Used  . Tobacco comment: never used tobacco  Substance and Sexual Activity  . Alcohol use: No    Alcohol/week: 0.0 oz  . Drug use: No  . Sexual activity: Not on file  Lifestyle  . Physical activity:    Days per week: Not on file    Minutes per session: Not on file  . Stress: Not on file  Relationships  . Social connections:    Talks on phone: Not on file    Gets together: Not on file    Attends religious service: Not on file    Active member of club or organization: Not on file    Attends meetings of clubs or organizations: Not on file    Relationship status: Not on file  Other Topics Concern  . Not on file  Social History Narrative    . Not on file    ECOG Status: 0 - Asymptomatic  Review of Systems: A 12 point ROS discussed and pertinent positives are indicated in the HPI above.  All other systems are negative.  Review of Systems   No bone pain.  No diarrhea.  No flushing.  Patient does report some intermittent chest pain on LEFT and RIGHT upon waking which is difficult to localize and described.    Vital Signs: There were no vitals taken for this visit.  Physical Exam Well-nourished.  Walking with cane.  Ambulation improved from initial encounter some 6 months prior.  subjectively patient appears more vibrant..   Imaging: DOTATATE PET SCAN 08/07/17 IMPRESSION: 1. Overall positive response to  therapy with many nodules decreased in size and some nodules resolved completely. Additionally several nodules are decrease in radiotracer activity. 2. One nodule is increased in size in the RIGHT upper lobe along the fissure with mild activity.. 3. No evidence active metastatic disease in the sclerotic sacral skeletal metastasis   Labs:  CBC: Recent Labs    04/19/17 0942 06/07/17 0949 07/10/17 0830 07/26/17 1028  WBC 6.0 7.8 11.5* 7.1  HGB 11.0* 12.1* 11.8* 11.6*  HCT 34.1* 37.2* 36.7* 37.0*  PLT 210 225 285 252    COAGS: No results for input(s): INR, APTT in the last 8760 hours.  BMP: Recent Labs    01/23/17 0954 01/25/17 1003  03/20/17 0900 04/19/17 0942 06/07/17 0949 07/10/17 0830 07/26/17 1028  NA 138 145   < > 139 139 140 139 141  K 3.7 3.8   < > 3.7 4.4 4.1 3.8 3.9  CL 106 106   < > 103 104 102 101 103  CO2 25 23   < > 27 31 30 27 31   GLUCOSE 76 67*   < > 86 85 65* 130* 161*  BUN 18 13   < > 16 16 21 22 20   CALCIUM 9.3 9.4   < > 9.4 9.1 9.4 9.3 9.8  CREATININE 1.13 1.00   < > 1.18 0.80 1.40* 1.34* 1.10  GFRNONAA >60 >60  --  >60  --   --  52*  --   GFRAA >60 >60  --  >60  --   --  >60  --    < > = values in this interval not displayed.    LIVER FUNCTION TESTS: Recent Labs     04/19/17 0942 06/07/17 0949 07/10/17 0830 07/26/17 1028  BILITOT 0.5 0.6 0.4 0.5  AST 17 23 18 22   ALT 17 27 14 20   ALKPHOS 61 70 66 66  PROT 6.6 7.0 7.3 7.2  ALBUMIN 3.4* 3.7 3.9 3.5    TUMOR MARKERS: Recent Labs    10/22/16 1129 11/12/16 1123 12/14/16 1159  CHROMOGRNA 2 2 2     Assessment and Plan:  1. Patient tolerated peptide receptor Lu 177 DOTATATE therapy well.  Patient reports more energy and appears morevibrant subjectively.  No bone pain.  Patient has returned to work for several days a week.   2. Non specific intermittent chest wall / "lung pain"  - unclear etiology 3. DOTATATE PET scan follow-up 08/07/2017 demonstrates reduction in size and radiotracer activity many of the metastatic pulmonary nodules.  Some nodules resolved completely.  No activity in the sacral lesion.   This positive response may continue to manifest over the upcoming 6 months post therapy. (see DOTATATE PET-CT scan for report).  4. Recommend follow-up remaining pulmonary nodules with CT thorax.  Chromogranin A levels and PSA remain noncontributory    Thank you for this interesting consult.  I greatly enjoyed meeting [name] and look forward to participating in their care.  A copy of this report was sent to the requesting provider on this date.  Electronically Signed: Rennis Golden, MD 08/14/2017, 1:36 PM   I spent a total of    15 Minutes in face to face in clinical consultation, greater than 50% of which was counseling/coordinating care for metastatic neuroendocrine tumor.

## 2017-08-23 ENCOUNTER — Inpatient Hospital Stay: Payer: Medicare Other

## 2017-08-23 ENCOUNTER — Inpatient Hospital Stay: Payer: Medicare Other | Attending: Hematology & Oncology | Admitting: Hematology & Oncology

## 2017-08-23 ENCOUNTER — Other Ambulatory Visit: Payer: Self-pay

## 2017-08-23 ENCOUNTER — Encounter: Payer: Self-pay | Admitting: Hematology & Oncology

## 2017-08-23 VITALS — BP 114/57 | HR 78 | Temp 98.3°F | Resp 18 | Wt 152.0 lb

## 2017-08-23 DIAGNOSIS — E119 Type 2 diabetes mellitus without complications: Secondary | ICD-10-CM | POA: Diagnosis not present

## 2017-08-23 DIAGNOSIS — Z79899 Other long term (current) drug therapy: Secondary | ICD-10-CM | POA: Insufficient documentation

## 2017-08-23 DIAGNOSIS — M2550 Pain in unspecified joint: Secondary | ICD-10-CM

## 2017-08-23 DIAGNOSIS — C7951 Secondary malignant neoplasm of bone: Secondary | ICD-10-CM | POA: Diagnosis not present

## 2017-08-23 DIAGNOSIS — Z7982 Long term (current) use of aspirin: Secondary | ICD-10-CM | POA: Insufficient documentation

## 2017-08-23 DIAGNOSIS — C7A8 Other malignant neuroendocrine tumors: Secondary | ICD-10-CM

## 2017-08-23 DIAGNOSIS — Z5111 Encounter for antineoplastic chemotherapy: Secondary | ICD-10-CM | POA: Diagnosis present

## 2017-08-23 DIAGNOSIS — R3915 Urgency of urination: Secondary | ICD-10-CM | POA: Diagnosis not present

## 2017-08-23 DIAGNOSIS — C61 Malignant neoplasm of prostate: Secondary | ICD-10-CM | POA: Insufficient documentation

## 2017-08-23 DIAGNOSIS — R531 Weakness: Secondary | ICD-10-CM | POA: Insufficient documentation

## 2017-08-23 DIAGNOSIS — Z7984 Long term (current) use of oral hypoglycemic drugs: Secondary | ICD-10-CM | POA: Diagnosis not present

## 2017-08-23 LAB — CBC WITH DIFFERENTIAL (CANCER CENTER ONLY)
Basophils Absolute: 0 10*3/uL (ref 0.0–0.1)
Basophils Relative: 0 %
Eosinophils Absolute: 0.2 10*3/uL (ref 0.0–0.5)
Eosinophils Relative: 3 %
HCT: 32.7 % — ABNORMAL LOW (ref 38.7–49.9)
Hemoglobin: 10.2 g/dL — ABNORMAL LOW (ref 13.0–17.1)
Lymphocytes Relative: 11 %
Lymphs Abs: 0.6 10*3/uL — ABNORMAL LOW (ref 0.9–3.3)
MCH: 32.1 pg (ref 28.0–33.4)
MCHC: 31.2 g/dL — ABNORMAL LOW (ref 32.0–35.9)
MCV: 102.8 fL — ABNORMAL HIGH (ref 82.0–98.0)
Monocytes Absolute: 0.3 10*3/uL (ref 0.1–0.9)
Monocytes Relative: 6 %
Neutro Abs: 4.3 10*3/uL (ref 1.5–6.5)
Neutrophils Relative %: 80 %
Platelet Count: 252 10*3/uL (ref 145–400)
RBC: 3.18 MIL/uL — ABNORMAL LOW (ref 4.20–5.70)
RDW: 12.8 % (ref 11.1–15.7)
WBC Count: 5.4 10*3/uL (ref 4.0–10.0)

## 2017-08-23 LAB — LACTATE DEHYDROGENASE: LDH: 178 U/L (ref 98–192)

## 2017-08-23 LAB — CMP (CANCER CENTER ONLY)
ALT: 19 U/L (ref 10–47)
AST: 16 U/L (ref 11–38)
Albumin: 3.1 g/dL — ABNORMAL LOW (ref 3.5–5.0)
Alkaline Phosphatase: 62 U/L (ref 26–84)
Anion gap: 16 — ABNORMAL HIGH (ref 5–15)
BUN: 17 mg/dL (ref 7–22)
CO2: 30 mmol/L (ref 18–33)
Calcium: 10 mg/dL (ref 8.0–10.3)
Chloride: 101 mmol/L (ref 98–108)
Creatinine: 1.4 mg/dL — ABNORMAL HIGH (ref 0.60–1.20)
Glucose, Bld: 231 mg/dL — ABNORMAL HIGH (ref 73–118)
Potassium: 3.7 mmol/L (ref 3.3–4.7)
Sodium: 147 mmol/L — ABNORMAL HIGH (ref 128–145)
Total Bilirubin: 0.5 mg/dL (ref 0.2–1.6)
Total Protein: 7.1 g/dL (ref 6.4–8.1)

## 2017-08-23 MED ORDER — GLIMEPIRIDE 2 MG PO TABS: 2.0000 mg | ORAL_TABLET | Freq: Every day | ORAL | 4 refills | Status: DC

## 2017-08-23 MED FILL — GLIMEPIRIDE 2 MG TABLET: 2 | 30 days supply | Qty: 30 | Fill #0

## 2017-08-23 MED FILL — EZETIMIBE 10 MG TABLET: 10 | 90 days supply | Qty: 90 | Fill #2

## 2017-08-23 NOTE — Progress Notes (Signed)
Hematology and Oncology Follow Up Visit  [name] [id number] 07/22/1945 72 y.o. 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  He is doing fairly well.  He is a little bit tired.  He was involved with a local community play.  He was in the play.  They gave quite a few performances.  As such, this wore him out a little bit.  He did have a Lutathera scan done.  This was done on July 24.  Thankfully, this show that he is having a very nice response to the Lutathera injections.  He has had a decrease in his pulmonary nodules.  He has had a decrease in  some adenopathy. No hH      as of anesthetic disease in the sacrum.  He really has done well with the [place].  I am just so happy for him.  He still has the weakness in the right leg.  He has a brace on the right lower leg.  There is been no nausea or vomiting.  He has had some problems with his blood sugars.  He has been having some urinary frequency.  His blood sugar today is quite high.  I think his blood sugar is 231.  He is on metformin.  I will stop the metformin and put him on Amaryl (2 mg p.o. daily).  He has had no fever.  He has had no bleeding.  Overall, his performance status is ECOG 1.      Medications:  Current Outpatient Medications:  .  acetaminophen (TYLENOL) 500 MG tablet, Take 1,000 mg by mouth every 4 (four) hours as needed for moderate pain., Disp: , Rfl:  .  aspirin 81 MG  tablet, Take 81 mg by mouth daily., Disp: , Rfl:  .  Calcium Carbonate-Vitamin D (CALCIUM + D PO), Take 770 mg by mouth 2 (two) times daily. , Disp: , Rfl:  .  CALCIUM-IRON-VIT D-VIT K PO, Take 1 tablet by mouth 2 (two) times daily., Disp: , Rfl:  .  gabapentin (NEURONTIN) 300 MG capsule, TAKE 1 CAPSULE BY MOUTH TWICE A DAY THEN TAKE 2 CAPSULES AT BEDTIME, Disp: 120 capsule, Rfl: 4 .  metFORMIN (GLUCOPHAGE-XR) 500 MG 24 hr tablet, TAKE TWO TABLETS BY MOUTH DAILY WITH BREAKFAST, Disp: 180 tablet, Rfl: 2 .  ondansetron (ZOFRAN) 8 MG tablet, Take 1 tablet (8 mg total) by mouth 2 (two) times daily as needed for nausea or vomiting., Disp: 20 tablet, Rfl: 0 .  OVER THE COUNTER MEDICATION, every morning. Juice Plus -- 8 capsule of each the garden, vineyard, and orchead, Disp: , Rfl:  .  predniSONE (DELTASONE) 5 MG tablet, TAKE 1 TABLET BY MOUTH DAILY, Disp: 60 tablet, Rfl: 3 .  prochlorperazine (COMPAZINE) 10 MG tablet, Take 1 tablet (10 mg total) by mouth every 6 (six) hours as needed  for nausea or vomiting., Disp: 30 tablet, Rfl: 0 .  traMADol (ULTRAM) 50 MG tablet, TAKE 1 TABLET BY MOUTH EVERY 6 HOURS AS NEEDED, Disp: 90 tablet, Rfl: 2 .  UNABLE TO FIND, Med Name: Juice Plus Omega Blend, Disp: , Rfl:  .  UNABLE TO FIND, Med Name: Juice Plus [employer], Garden Blend, [employer], Disp: , Rfl:  .  VESICARE 5 MG tablet, TAKE 1 TABLET BY MOUTH DAILY., Disp: 30 tablet, Rfl: 4 .  ZETIA 10 MG tablet, Take 10 mg by mouth daily. , Disp: , Rfl:  No current facility-administered medications for this visit.   Facility-Administered Medications Ordered in Other Visits:  .  octreotide (SANDOSTATIN LAR) IM injection 30 mg, 30 mg, Intramuscular, Once, Phillippe Orlick, Rudell Cobb, MD  Allergies:  Allergies  Allergen Reactions  . Codeine Nausea And Vomiting  . Hydrocodone Nausea And Vomiting    Past Medical History, Surgical history, Social history, and Family History were reviewed and updated.  Review of  Systems: Review of Systems  Genitourinary: Positive for urgency.  Musculoskeletal: Positive for joint pain.  Neurological: Positive for focal weakness.  All other systems reviewed and are negative.    Physical Exam:  weight is 152 lb (68.9 kg). His oral temperature is 98.3 F (36.8 C). His blood pressure is 114/57 (abnormal) and his pulse is 78. His respiration is 18 and oxygen saturation is 100%.   Physical Exam  Constitutional: He is oriented to person, place, and time.  HENT:  Head: Normocephalic and atraumatic.  Mouth/Throat: Oropharynx is clear and moist.  Eyes: Pupils are equal, round, and reactive to light. EOM are normal.  Neck: Normal range of motion.  Cardiovascular: Normal rate, regular rhythm and normal heart sounds.  Pulmonary/Chest: Effort normal and breath sounds normal.  Abdominal: Soft. Bowel sounds are normal.  Musculoskeletal: Normal range of motion. He exhibits no edema, tenderness or deformity.  Lymphadenopathy:    He has no cervical adenopathy.  Neurological: He is alert and oriented to person, place, and time.  Skin: Skin is warm and dry. No rash noted. No erythema.  Psychiatric: He has a normal mood and affect. His behavior is normal. Judgment and thought content normal.  Vitals reviewed.    Lab Results  Component Value Date   WBC 5.4 08/23/2017   HGB 10.2 (L) 08/23/2017   HCT 32.7 (L) 08/23/2017   MCV 102.8 (H) 08/23/2017   PLT 252 08/23/2017     Chemistry      Component Value Date/Time   NA 147 (H) 08/23/2017 1011   NA 144 12/14/2016 1159   NA 140 10/22/2016 1129   K 3.7 08/23/2017 1011   K 3.5 12/14/2016 1159   K 4.3 10/22/2016 1129   CL 101 08/23/2017 1011   CL 103 12/14/2016 1159   CO2 30 08/23/2017 1011   CO2 30 12/14/2016 1159   CO2 27 10/22/2016 1129   BUN 17 08/23/2017 1011   BUN 16 12/14/2016 1159   BUN 14.0 10/22/2016 1129   CREATININE 1.40 (H) 08/23/2017 1011   CREATININE 1.3 (H) 12/14/2016 1159   CREATININE 1.1  10/22/2016 1129      Component Value Date/Time   CALCIUM 10.0 08/23/2017 1011   CALCIUM 10.0 12/14/2016 1159   CALCIUM 10.4 10/22/2016 1129   ALKPHOS 62 08/23/2017 1011   ALKPHOS 48 12/14/2016 1159   ALKPHOS 60 10/22/2016 1129   AST 16 08/23/2017 1011   AST 16 10/22/2016 1129   ALT 19 08/23/2017 1011  ALT 23 12/14/2016 1159   ALT 14 10/22/2016 1129   BILITOT 0.5 08/23/2017 1011   BILITOT 0.31 10/22/2016 1129         Impression and Plan: Mr. Blitch is 72 year old gentleman with metastatic prostate cancer.  This is actually a neuroendocrine variant.  I think that his prostate cancer must have dedifferentiated a little bit.  Again, the PET scan that he had a look fantastic.  We will continue to follow him along.  I probably would repeat another one in December.  We will have him come back in a month.  He needs his injections for the prostate cancer.  I still want to make sure that we take care of this and that we treat this so that he does not have any problems.    [name], MD 8/9/201912:01 PM

## 2017-08-26 LAB — CHROMOGRANIN A: Chromogranin A: 1 nmol/L (ref 0–5)

## 2017-09-10 DIAGNOSIS — L918 Other hypertrophic disorders of the skin: Secondary | ICD-10-CM | POA: Diagnosis not present

## 2017-09-10 DIAGNOSIS — L820 Inflamed seborrheic keratosis: Secondary | ICD-10-CM | POA: Diagnosis not present

## 2017-09-10 DIAGNOSIS — L570 Actinic keratosis: Secondary | ICD-10-CM | POA: Diagnosis not present

## 2017-09-11 MED FILL — GABAPENTIN 300 MG CAPSULE: 300 | 30 days supply | Qty: 120 | Fill #4

## 2017-09-23 MED FILL — GLIMEPIRIDE 2 MG TABLET: 2 | 30 days supply | Qty: 30 | Fill #1

## 2017-09-27 ENCOUNTER — Inpatient Hospital Stay: Payer: Medicare Other

## 2017-09-27 ENCOUNTER — Other Ambulatory Visit: Payer: Self-pay

## 2017-09-27 ENCOUNTER — Encounter: Payer: Self-pay | Admitting: Hematology & Oncology

## 2017-09-27 ENCOUNTER — Inpatient Hospital Stay: Payer: Medicare Other | Attending: Hematology & Oncology | Admitting: Hematology & Oncology

## 2017-09-27 ENCOUNTER — Other Ambulatory Visit: Payer: Self-pay | Admitting: Family

## 2017-09-27 VITALS — BP 107/53 | HR 80 | Resp 18 | Wt 155.0 lb

## 2017-09-27 DIAGNOSIS — Z7982 Long term (current) use of aspirin: Secondary | ICD-10-CM | POA: Diagnosis not present

## 2017-09-27 DIAGNOSIS — M2550 Pain in unspecified joint: Secondary | ICD-10-CM | POA: Diagnosis not present

## 2017-09-27 DIAGNOSIS — E119 Type 2 diabetes mellitus without complications: Secondary | ICD-10-CM | POA: Diagnosis not present

## 2017-09-27 DIAGNOSIS — R531 Weakness: Secondary | ICD-10-CM | POA: Insufficient documentation

## 2017-09-27 DIAGNOSIS — C7951 Secondary malignant neoplasm of bone: Secondary | ICD-10-CM

## 2017-09-27 DIAGNOSIS — R3915 Urgency of urination: Secondary | ICD-10-CM | POA: Insufficient documentation

## 2017-09-27 DIAGNOSIS — G63 Polyneuropathy in diseases classified elsewhere: Principal | ICD-10-CM

## 2017-09-27 DIAGNOSIS — Z7984 Long term (current) use of oral hypoglycemic drugs: Secondary | ICD-10-CM

## 2017-09-27 DIAGNOSIS — C7A8 Other malignant neuroendocrine tumors: Secondary | ICD-10-CM

## 2017-09-27 DIAGNOSIS — Z79899 Other long term (current) drug therapy: Secondary | ICD-10-CM | POA: Insufficient documentation

## 2017-09-27 DIAGNOSIS — C61 Malignant neoplasm of prostate: Secondary | ICD-10-CM

## 2017-09-27 DIAGNOSIS — C801 Malignant (primary) neoplasm, unspecified: Secondary | ICD-10-CM

## 2017-09-27 LAB — CMP (CANCER CENTER ONLY)
ALT: 26 U/L (ref 10–47)
AST: 26 U/L (ref 11–38)
Albumin: 3.5 g/dL (ref 3.5–5.0)
Alkaline Phosphatase: 60 U/L (ref 26–84)
Anion gap: 3 — ABNORMAL LOW (ref 5–15)
BUN: 22 mg/dL (ref 7–22)
CO2: 27 mmol/L (ref 18–33)
Calcium: 9.1 mg/dL (ref 8.0–10.3)
Chloride: 109 mmol/L — ABNORMAL HIGH (ref 98–108)
Creatinine: 1.5 mg/dL — ABNORMAL HIGH (ref 0.60–1.20)
Glucose, Bld: 138 mg/dL — ABNORMAL HIGH (ref 73–118)
Potassium: 3.5 mmol/L (ref 3.3–4.7)
Sodium: 139 mmol/L (ref 128–145)
Total Bilirubin: 0.5 mg/dL (ref 0.2–1.6)
Total Protein: 7.1 g/dL (ref 6.4–8.1)

## 2017-09-27 LAB — CBC WITH DIFFERENTIAL (CANCER CENTER ONLY)
Basophils Absolute: 0 10*3/uL (ref 0.0–0.1)
Basophils Relative: 1 %
Eosinophils Absolute: 0.5 10*3/uL (ref 0.0–0.5)
Eosinophils Relative: 7 %
HCT: 34.7 % — ABNORMAL LOW (ref 38.7–49.9)
Hemoglobin: 11 g/dL — ABNORMAL LOW (ref 13.0–17.1)
Lymphocytes Relative: 10 %
Lymphs Abs: 0.7 10*3/uL — ABNORMAL LOW (ref 0.9–3.3)
MCH: 33 pg (ref 28.0–33.4)
MCHC: 31.7 g/dL — ABNORMAL LOW (ref 32.0–35.9)
MCV: 104.2 fL — ABNORMAL HIGH (ref 82.0–98.0)
Monocytes Absolute: 0.5 10*3/uL (ref 0.1–0.9)
Monocytes Relative: 8 %
Neutro Abs: 4.8 10*3/uL (ref 1.5–6.5)
Neutrophils Relative %: 74 %
Platelet Count: 243 10*3/uL (ref 145–400)
RBC: 3.33 MIL/uL — ABNORMAL LOW (ref 4.20–5.70)
RDW: 13.7 % (ref 11.1–15.7)
WBC Count: 6.6 10*3/uL (ref 4.0–10.0)

## 2017-09-27 MED ORDER — DENOSUMAB 120 MG/1.7ML ~~LOC~~ SOLN
SUBCUTANEOUS | Status: AC
  Filled 2017-09-27: qty 1.7

## 2017-09-27 MED ORDER — LEUPROLIDE ACETATE (3 MONTH) 22.5 MG IM KIT: 22.5000 mg | PACK | Freq: Once | INTRAMUSCULAR | Status: DC

## 2017-09-27 MED ORDER — DENOSUMAB 120 MG/1.7ML ~~LOC~~ SOLN: 120.0000 mg | Freq: Once | SUBCUTANEOUS | Status: DC

## 2017-09-27 MED FILL — SOLIFENACIN SUCCINATE 5 MG: 5 | 30 days supply | Qty: 30 | Fill #1

## 2017-09-27 NOTE — Progress Notes (Signed)
Hematology and Oncology Follow Up Visit  [name] [id number] 1945/09/24 72 y.o. 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 is back for followup.  He is doing fairly well.  So far, everything is going quite well for him.  He and his wife will have an anniversary coming up in a week or so.  They are planning to go to [facility] for this.  There is been no issues with respect to nausea and vomiting.  His blood sugars are still on the higher side.  He is still on low-dose prednisone.  I think we can stop the prednisone.  This might help with his blood sugars.  He has had no change with the weakness in the right leg.  He has a brace on the right lower leg.  There is been no headache.  He is had no rashes.  He has had no change in bowel or bladder habits.   Overall, his performance status is ECOG 1.      Medications:  Current Outpatient Medications:  .  acetaminophen (TYLENOL) 500 MG tablet, Take 1,000 mg by mouth every 4 (four) hours as needed for moderate pain., Disp: , Rfl:  .  aspirin 81 MG tablet, Take 81 mg by mouth daily., Disp: , Rfl:  .  Calcium Carbonate-Vitamin D (CALCIUM + D PO), Take 770 mg by mouth 2 (two) times daily. , Disp: , Rfl:  .  CALCIUM-IRON-VIT D-VIT K PO, Take 1 tablet by mouth 2 (two) times daily., Disp: , Rfl:  .  gabapentin (NEURONTIN) 300 MG capsule, TAKE 1 CAPSULE BY MOUTH TWICE A DAY THEN TAKE 2 CAPSULES AT BEDTIME (Patient taking  differently: Take 2 capsule by mouth in the morning and 2 capsules at night.), Disp: 120 capsule, Rfl: 4 .  glimepiride (AMARYL) 2 MG tablet, Take 1 tablet (2 mg total) by mouth daily with breakfast., Disp: 30 tablet, Rfl: 4 .  ondansetron (ZOFRAN) 8 MG tablet, Take 1 tablet (8 mg total) by mouth 2 (two) times daily as needed for nausea or vomiting., Disp: 20 tablet, Rfl: 0 .  OVER THE COUNTER MEDICATION, every morning. Juice Plus -- 8 capsule of each the garden, vineyard, and orchead, Disp: , Rfl:  .  predniSONE (DELTASONE) 5 MG tablet, TAKE 1 TABLET BY MOUTH DAILY, Disp: 60 tablet, Rfl: 3 .  UNABLE TO FIND, Med Name: Juice Plus Omega Blend, Disp: , Rfl:  .  UNABLE TO FIND, Med Name: Juice Plus [employer], Garden Blend, [employer], Disp: , Rfl:  .  VESICARE 5 MG tablet, TAKE 1 TABLET BY MOUTH DAILY., Disp: 30 tablet, Rfl: 4 .  ZETIA 10 MG tablet, Take 10 mg by mouth daily. , Disp: , Rfl:  No current facility-administered medications for this visit.   Facility-Administered Medications Ordered in Other Visits:  .  octreotide (SANDOSTATIN LAR) IM injection  30 mg, 30 mg, Intramuscular, Once, Jordan Caraveo, Rudell Cobb, MD  Allergies:  Allergies  Allergen Reactions  . Codeine Nausea And Vomiting  . Hydrocodone Nausea And Vomiting    Past Medical History, Surgical history, Social history, and Family History were reviewed and updated.  Review of Systems: Review of Systems  Genitourinary: Positive for urgency.  Musculoskeletal: Positive for joint pain.  Neurological: Positive for focal weakness.  All other systems reviewed and are negative.    Physical Exam:  weight is 155 lb (70.3 kg). His blood pressure is 107/53 (abnormal) and his pulse is 80. His respiration is 18 and oxygen saturation is 100%.   Physical Exam  Constitutional: He is oriented to person, place, and time.  HENT:  Head: Normocephalic and atraumatic.  Mouth/Throat: Oropharynx is clear and moist.  Eyes: Pupils are equal,  round, and reactive to light. EOM are normal.  Neck: Normal range of motion.  Cardiovascular: Normal rate, regular rhythm and normal heart sounds.  Pulmonary/Chest: Effort normal and breath sounds normal.  Abdominal: Soft. Bowel sounds are normal.  Musculoskeletal: Normal range of motion. He exhibits no edema, tenderness or deformity.  Lymphadenopathy:    He has no cervical adenopathy.  Neurological: He is alert and oriented to person, place, and time.  Skin: Skin is warm and dry. No rash noted. No erythema.  Psychiatric: He has a normal mood and affect. His behavior is normal. Judgment and thought content normal.  Vitals reviewed.    Lab Results  Component Value Date   WBC 6.6 09/27/2017   HGB 11.0 (L) 09/27/2017   HCT 34.7 (L) 09/27/2017   MCV 104.2 (H) 09/27/2017   PLT 243 09/27/2017     Chemistry      Component Value Date/Time   NA 139 09/27/2017 1021   NA 144 12/14/2016 1159   NA 140 10/22/2016 1129   K 3.5 09/27/2017 1021   K 3.5 12/14/2016 1159   K 4.3 10/22/2016 1129   CL 109 (H) 09/27/2017 1021   CL 103 12/14/2016 1159   CO2 27 09/27/2017 1021   CO2 30 12/14/2016 1159   CO2 27 10/22/2016 1129   BUN 22 09/27/2017 1021   BUN 16 12/14/2016 1159   BUN 14.0 10/22/2016 1129   CREATININE 1.50 (H) 09/27/2017 1021   CREATININE 1.3 (H) 12/14/2016 1159   CREATININE 1.1 10/22/2016 1129      Component Value Date/Time   CALCIUM 9.1 09/27/2017 1021   CALCIUM 10.0 12/14/2016 1159   CALCIUM 10.4 10/22/2016 1129   ALKPHOS 60 09/27/2017 1021   ALKPHOS 48 12/14/2016 1159   ALKPHOS 60 10/22/2016 1129   AST 26 09/27/2017 1021   AST 16 10/22/2016 1129   ALT 26 09/27/2017 1021   ALT 23 12/14/2016 1159   ALT 14 10/22/2016 1129   BILITOT 0.5 09/27/2017 1021   BILITOT 0.31 10/22/2016 1129         Impression and Plan: [name] is 72 year old gentleman with metastatic prostate cancer.  This is actually a neuroendocrine variant.  I think that his prostate cancer must  have dedifferentiated a little bit.  I do not think that he will need another Lutathera PET scan until December.  We will see him back 1 November.  When he comes back, he will get his Delton See and his Lupron injections.  Again, we will stop the prednisone.    [name], MD 9/13/201912:38 PM

## 2017-09-28 LAB — TESTOSTERONE: Testosterone: <3 ng/dL — ABNORMAL LOW (ref 264–916)

## 2017-09-28 LAB — PROSTATE-SPECIFIC AG, SERUM (LABCORP): Prostate Specific Ag, Serum: <0.1 ng/mL (ref 0.0–4.0)

## 2017-09-30 LAB — CHROMOGRANIN A: Chromogranin A: 2 nmol/L (ref 0–5)

## 2017-10-07 MED FILL — GABAPENTIN 300 MG CAPSULE: 300 | 30 days supply | Qty: 120 | Fill #0

## 2017-10-21 MED FILL — GLIMEPIRIDE 2 MG TABLET: 2 | 30 days supply | Qty: 30 | Fill #2

## 2017-10-23 DIAGNOSIS — I10 Essential (primary) hypertension: Secondary | ICD-10-CM | POA: Diagnosis not present

## 2017-10-23 DIAGNOSIS — Z23 Encounter for immunization: Secondary | ICD-10-CM | POA: Diagnosis not present

## 2017-10-23 DIAGNOSIS — E785 Hyperlipidemia, unspecified: Secondary | ICD-10-CM | POA: Diagnosis not present

## 2017-10-23 DIAGNOSIS — E119 Type 2 diabetes mellitus without complications: Secondary | ICD-10-CM | POA: Diagnosis not present

## 2017-11-08 MED FILL — GABAPENTIN 300 MG CAPSULE: 300 | 30 days supply | Qty: 120 | Fill #1

## 2017-11-14 MED FILL — GLIMEPIRIDE 2 MG TABLET: 2 | 30 days supply | Qty: 30 | Fill #3

## 2017-11-15 ENCOUNTER — Inpatient Hospital Stay: Payer: Medicare Other

## 2017-11-15 ENCOUNTER — Encounter: Payer: Self-pay | Admitting: Hematology & Oncology

## 2017-11-15 ENCOUNTER — Other Ambulatory Visit: Payer: Self-pay

## 2017-11-15 ENCOUNTER — Inpatient Hospital Stay: Payer: Medicare Other | Attending: Hematology & Oncology | Admitting: Hematology & Oncology

## 2017-11-15 VITALS — BP 135/73 | HR 92 | Temp 98.1°F | Resp 20 | Wt 158.8 lb

## 2017-11-15 DIAGNOSIS — C7951 Secondary malignant neoplasm of bone: Secondary | ICD-10-CM

## 2017-11-15 DIAGNOSIS — E119 Type 2 diabetes mellitus without complications: Secondary | ICD-10-CM | POA: Insufficient documentation

## 2017-11-15 DIAGNOSIS — R531 Weakness: Secondary | ICD-10-CM

## 2017-11-15 DIAGNOSIS — Z79899 Other long term (current) drug therapy: Secondary | ICD-10-CM

## 2017-11-15 DIAGNOSIS — C61 Malignant neoplasm of prostate: Secondary | ICD-10-CM | POA: Insufficient documentation

## 2017-11-15 DIAGNOSIS — R05 Cough: Secondary | ICD-10-CM | POA: Diagnosis not present

## 2017-11-15 DIAGNOSIS — R918 Other nonspecific abnormal finding of lung field: Secondary | ICD-10-CM | POA: Diagnosis not present

## 2017-11-15 DIAGNOSIS — Z7984 Long term (current) use of oral hypoglycemic drugs: Secondary | ICD-10-CM | POA: Diagnosis not present

## 2017-11-15 DIAGNOSIS — Z7982 Long term (current) use of aspirin: Secondary | ICD-10-CM | POA: Diagnosis not present

## 2017-11-15 DIAGNOSIS — Z5111 Encounter for antineoplastic chemotherapy: Secondary | ICD-10-CM | POA: Diagnosis not present

## 2017-11-15 DIAGNOSIS — C7A8 Other malignant neuroendocrine tumors: Secondary | ICD-10-CM

## 2017-11-15 LAB — CBC WITH DIFFERENTIAL (CANCER CENTER ONLY)
Abs Immature Granulocytes: 0.03 10*3/uL (ref 0.00–0.07)
Basophils Absolute: 0 10*3/uL (ref 0.0–0.1)
Basophils Relative: 1 %
Eosinophils Absolute: 0.3 10*3/uL (ref 0.0–0.5)
Eosinophils Relative: 4 %
HCT: 35.2 % — ABNORMAL LOW (ref 39.0–52.0)
Hemoglobin: 10.9 g/dL — ABNORMAL LOW (ref 13.0–17.0)
Immature Granulocytes: 0 %
Lymphocytes Relative: 15 %
Lymphs Abs: 1 10*3/uL (ref 0.7–4.0)
MCH: 31.2 pg (ref 26.0–34.0)
MCHC: 31 g/dL (ref 30.0–36.0)
MCV: 100.9 fL — ABNORMAL HIGH (ref 80.0–100.0)
Monocytes Absolute: 0.7 10*3/uL (ref 0.1–1.0)
Monocytes Relative: 11 %
Neutro Abs: 4.7 10*3/uL (ref 1.7–7.7)
Neutrophils Relative %: 69 %
Platelet Count: 303 10*3/uL (ref 150–400)
RBC: 3.49 MIL/uL — ABNORMAL LOW (ref 4.22–5.81)
RDW: 12.3 % (ref 11.5–15.5)
WBC Count: 6.8 10*3/uL (ref 4.0–10.5)
nRBC: 0 % (ref 0.0–0.2)

## 2017-11-15 LAB — CMP (CANCER CENTER ONLY)
ALT: 24 U/L (ref 10–47)
AST: 27 U/L (ref 11–38)
Albumin: 3.6 g/dL (ref 3.5–5.0)
Alkaline Phosphatase: 63 U/L (ref 26–84)
Anion gap: 15 (ref 5–15)
BUN: 23 mg/dL — ABNORMAL HIGH (ref 7–22)
CO2: 27 mmol/L (ref 18–33)
Calcium: 10.4 mg/dL — ABNORMAL HIGH (ref 8.0–10.3)
Chloride: 102 mmol/L (ref 98–108)
Creatinine: 1.4 mg/dL — ABNORMAL HIGH (ref 0.60–1.20)
Glucose, Bld: 92 mg/dL (ref 73–118)
Potassium: 3.8 mmol/L (ref 3.3–4.7)
Sodium: 144 mmol/L (ref 128–145)
Total Bilirubin: 0.5 mg/dL (ref 0.2–1.6)
Total Protein: 7.3 g/dL (ref 6.4–8.1)

## 2017-11-15 MED ORDER — LEUPROLIDE ACETATE (3 MONTH) 22.5 MG IM KIT
22.5000 mg | PACK | Freq: Once | INTRAMUSCULAR | Status: AC
  Administered 2017-11-15: 22.5 mg via INTRAMUSCULAR
  Filled 2017-11-15: qty 22.5

## 2017-11-15 MED ORDER — DENOSUMAB 120 MG/1.7ML ~~LOC~~ SOLN
120.0000 mg | Freq: Once | SUBCUTANEOUS | Status: AC
  Administered 2017-11-15: 120 mg via SUBCUTANEOUS

## 2017-11-15 MED ORDER — DENOSUMAB 120 MG/1.7ML ~~LOC~~ SOLN
SUBCUTANEOUS | Status: AC
  Filled 2017-11-15: qty 1.7

## 2017-11-15 MED FILL — EZETIMIBE 10 MG TABLET: 10 | 90 days supply | Qty: 90 | Fill #3

## 2017-11-15 NOTE — Progress Notes (Signed)
Hematology and Oncology Follow Up Visit  [name] [id number] [birth year] 72 y.o. 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 is back for followup.  He is doing fairly well.  The problem that he is having that he is having a little bit of a cough.  His wife said that he has some sleeping issues.  She is worried about the nodules in his lung.  He is due for another scan.  We will set this scan up in a couple weeks.  He has had no problems with nausea or vomiting.  His right leg is still weak but he is getting around a little bit better.  He has had no bleeding.  His blood sugars have been very well controlled.  There is been no fever.  He has had no leg swelling.  Overall, his performance status is ECOG 1.     Medications:  Current Outpatient Medications:  .  acetaminophen (TYLENOL) 500 MG tablet, Take 1,000 mg by mouth every 4 (four) hours as needed for moderate pain., Disp: , Rfl:  .  aspirin 81 MG tablet, Take 81 mg by mouth daily., Disp: , Rfl:  .  Calcium Carbonate-Vitamin D (CALCIUM + D PO), Take 770 mg by mouth 2 (two) times daily. , Disp: , Rfl:  .  CALCIUM-IRON-VIT D-VIT K PO, Take 1 tablet by mouth 2 (two) times daily., Disp: , Rfl:  .  gabapentin (NEURONTIN) 300 MG capsule, TAKE 1 CAPSULE BY MOUTH TWICE A DAY THEN TAKE 2 CAPSULES AT BEDTIME (Patient taking differently: Take 2 capsule by mouth in the morning and 2 capsules at night.), Disp: 120 capsule, Rfl: 4 .   glimepiride (AMARYL) 2 MG tablet, Take 1 tablet (2 mg total) by mouth daily with breakfast., Disp: 30 tablet, Rfl: 4 .  ondansetron (ZOFRAN) 8 MG tablet, Take 1 tablet (8 mg total) by mouth 2 (two) times daily as needed for nausea or vomiting., Disp: 20 tablet, Rfl: 0 .  OVER THE COUNTER MEDICATION, every morning. Juice Plus -- 8 capsule of each the garden, vineyard, and orchead, Disp: , Rfl:  .  UNABLE TO FIND, Med Name: Juice Plus Omega Blend, Disp: , Rfl:  .  UNABLE TO FIND, Med Name: Juice Plus [employer], Garden Blend, [employer], Disp: , Rfl:  .  VESICARE 5 MG tablet, TAKE 1 TABLET BY MOUTH DAILY., Disp: 30 tablet, Rfl: 4 .  ZETIA 10 MG tablet, Take 10 mg by mouth daily. , Disp: , Rfl:  No current facility-administered medications for this visit.   Facility-Administered Medications Ordered in Other Visits:  .  denosumab (XGEVA) injection 120 mg, 120 mg, Subcutaneous, Once, Ennever, Peter R, MD .  leuprolide (LUPRON) injection 22.5 mg, 22.5 mg, Intramuscular, Once, Ennever, Peter R, MD .  octreotide (SANDOSTATIN LAR) IM injection 30 mg, 30 mg, Intramuscular, Once, Ennever, Rudell Cobb, MD  Allergies:  Allergies  Allergen Reactions  .  Codeine Nausea And Vomiting  . Hydrocodone Nausea And Vomiting    Past Medical History, Surgical history, Social history, and Family History were reviewed and updated.  Review of Systems: Review of Systems  Genitourinary: Positive for urgency.  Musculoskeletal: Positive for joint pain.  Neurological: Positive for focal weakness.  All other systems reviewed and are negative.    Physical Exam:  weight is 158 lb 12.8 oz (72 kg). His oral temperature is 98.1 F (36.7 C). His blood pressure is 135/73 and his pulse is 92. His respiration is 20 and oxygen saturation is 99%.   Physical Exam  Constitutional: He is oriented to person, place, and time.  HENT:  Head: Normocephalic and atraumatic.  Mouth/Throat: Oropharynx is clear and moist.  Eyes:  Pupils are equal, round, and reactive to light. EOM are normal.  Neck: Normal range of motion.  Cardiovascular: Normal rate, regular rhythm and normal heart sounds.  Pulmonary/Chest: Effort normal and breath sounds normal.  Abdominal: Soft. Bowel sounds are normal.  Musculoskeletal: Normal range of motion. He exhibits no edema, tenderness or deformity.  Lymphadenopathy:    He has no cervical adenopathy.  Neurological: He is alert and oriented to person, place, and time.  Skin: Skin is warm and dry. No rash noted. No erythema.  Psychiatric: He has a normal mood and affect. His behavior is normal. Judgment and thought content normal.  Vitals reviewed.    Lab Results  Component Value Date   WBC 6.8 11/15/2017   HGB 10.9 (L) 11/15/2017   HCT 35.2 (L) 11/15/2017   MCV 100.9 (H) 11/15/2017   PLT 303 11/15/2017     Chemistry      Component Value Date/Time   NA 144 11/15/2017 0937   NA 144 12/14/2016 1159   NA 140 10/22/2016 1129   K 3.8 11/15/2017 0937   K 3.5 12/14/2016 1159   K 4.3 10/22/2016 1129   CL 102 11/15/2017 0937   CL 103 12/14/2016 1159   CO2 27 11/15/2017 0937   CO2 30 12/14/2016 1159   CO2 27 10/22/2016 1129   BUN 23 (H) 11/15/2017 0937   BUN 16 12/14/2016 1159   BUN 14.0 10/22/2016 1129   CREATININE 1.40 (H) 11/15/2017 0937   CREATININE 1.3 (H) 12/14/2016 1159   CREATININE 1.1 10/22/2016 1129      Component Value Date/Time   CALCIUM 10.4 (H) 11/15/2017 0937   CALCIUM 10.0 12/14/2016 1159   CALCIUM 10.4 10/22/2016 1129   ALKPHOS 63 11/15/2017 0937   ALKPHOS 48 12/14/2016 1159   ALKPHOS 60 10/22/2016 1129   AST 27 11/15/2017 0937   AST 16 10/22/2016 1129   ALT 24 11/15/2017 0937   ALT 23 12/14/2016 1159   ALT 14 10/22/2016 1129   BILITOT 0.5 11/15/2017 0937   BILITOT 0.31 10/22/2016 1129         Impression and Plan: [name] is 72 year old gentleman with metastatic prostate cancer.  This is actually a neuroendocrine variant.  I think that his  prostate cancer must have dedifferentiated a little bit.  We will get the Lutathera PET scan.  We will get this in a couple weeks.  I will plan to see him back in 3 weeks.  He will get his injections today.   [name], MD 11/1/201911:04 AM

## 2017-11-15 NOTE — Patient Instructions (Signed)
Denosumab injection What is this medicine? DENOSUMAB (den oh sue mab) slows bone breakdown. Prolia is used to treat osteoporosis in women after menopause and in men. Delton See is used to treat a high calcium level due to cancer and to prevent bone fractures and other bone problems caused by multiple myeloma or cancer bone metastases. Delton See is also used to treat giant cell tumor of the bone. This medicine may be used for other purposes; ask your health care provider or pharmacist if you have questions. COMMON BRAND NAME(S): Prolia, XGEVA What should I tell my health care provider before I take this medicine? They need to know if you have any of these conditions: -dental disease -having surgery or tooth extraction -infection -kidney disease -low levels of calcium or Vitamin D in the blood -malnutrition -on hemodialysis -skin conditions or sensitivity -thyroid or parathyroid disease -an unusual reaction to denosumab, other medicines, foods, dyes, or preservatives -pregnant or trying to get pregnant -breast-feeding How should I use this medicine? This medicine is for injection under the skin. It is given by a health care professional in a hospital or clinic setting. If you are getting Prolia, a special MedGuide will be given to you by the pharmacist with each prescription and refill. Be sure to read this information carefully each time. For Prolia, talk to your pediatrician regarding the use of this medicine in children. Special care may be needed. For Delton See, talk to your pediatrician regarding the use of this medicine in children. While this drug may be prescribed for children as young as 13 years for selected conditions, precautions do apply. Overdosage: If you think you have taken too much of this medicine contact a poison control center or emergency room at once. NOTE: This medicine is only for you. Do not share this medicine with others. What if I miss a dose? It is important not to miss your  dose. Call your doctor or health care professional if you are unable to keep an appointment. What may interact with this medicine? Do not take this medicine with any of the following medications: -other medicines containing denosumab This medicine may also interact with the following medications: -medicines that lower your chance of fighting infection -steroid medicines like prednisone or cortisone This list may not describe all possible interactions. Give your health care provider a list of all the medicines, herbs, non-prescription drugs, or dietary supplements you use. Also tell them if you smoke, drink alcohol, or use illegal drugs. Some items may interact with your medicine. What should I watch for while using this medicine? Visit your doctor or health care professional for regular checks on your progress. Your doctor or health care professional may order blood tests and other tests to see how you are doing. Call your doctor or health care professional for advice if you get a fever, chills or sore throat, or other symptoms of a cold or flu. Do not treat yourself. This drug may decrease your body's ability to fight infection. Try to avoid being around people who are sick. You should make sure you get enough calcium and vitamin D while you are taking this medicine, unless your doctor tells you not to. Discuss the foods you eat and the vitamins you take with your health care professional. See your dentist regularly. Brush and floss your teeth as directed. Before you have any dental work done, tell your dentist you are receiving this medicine. Do not become pregnant while taking this medicine or for 5 months after stopping  it. Talk with your doctor or health care professional about your birth control options while taking this medicine. Women should inform their doctor if they wish to become pregnant or think they might be pregnant. There is a potential for serious side effects to an unborn child. Talk  to your health care professional or pharmacist for more information. What side effects may I notice from receiving this medicine? Side effects that you should report to your doctor or health care professional as soon as possible: -allergic reactions like skin rash, itching or hives, swelling of the face, lips, or tongue -bone pain -breathing problems -dizziness -jaw pain, especially after dental work -redness, blistering, peeling of the skin -signs and symptoms of infection like fever or chills; cough; sore throat; pain or trouble passing urine -signs of low calcium like fast heartbeat, muscle cramps or muscle pain; pain, tingling, numbness in the hands or feet; seizures -unusual bleeding or bruising -unusually weak or tired Side effects that usually do not require medical attention (report to your doctor or health care professional if they continue or are bothersome): -constipation -diarrhea -headache -joint pain -loss of appetite -muscle pain -runny nose -tiredness -upset stomach This list may not describe all possible side effects. Call your doctor for medical advice about side effects. You may report side effects to FDA at 1-800-FDA-1088. Where should I keep my medicine? This medicine is only given in a clinic, doctor's office, or other health care setting and will not be stored at home. NOTE: This sheet is a summary. It may not cover all possible information. If you have questions about this medicine, talk to your doctor, pharmacist, or health care provider.  2018 Elsevier/Gold Standard (2016-01-24 19:17:21) Leuprolide injection What is this medicine? LEUPROLIDE (loo PROE lide) is a man-made hormone. It is used to treat the symptoms of prostate cancer. This medicine may also be used to treat children with early onset of puberty. It may be used for other hormonal conditions. This medicine may be used for other purposes; ask your health care provider or pharmacist if you have  questions. COMMON BRAND NAME(S): Lupron What should I tell my health care provider before I take this medicine? They need to know if you have any of these conditions: -diabetes -heart disease or previous heart attack -high blood pressure -high cholesterol -pain or difficulty passing urine -spinal cord metastasis -stroke -tobacco smoker -an unusual or allergic reaction to leuprolide, benzyl alcohol, other medicines, foods, dyes, or preservatives -pregnant or trying to get pregnant -breast-feeding How should I use this medicine? This medicine is for injection under the skin or into a muscle. You will be taught how to prepare and give this medicine. Use exactly as directed. Take your medicine at regular intervals. Do not take your medicine more often than directed. It is important that you put your used needles and syringes in a special sharps container. Do not put them in a trash can. If you do not have a sharps container, call your pharmacist or healthcare provider to get one. A special MedGuide will be given to you by the pharmacist with each prescription and refill. Be sure to read this information carefully each time. Talk to your pediatrician regarding the use of this medicine in children. While this medicine may be prescribed for children as young as 8 years for selected conditions, precautions do apply. Overdosage: If you think you have taken too much of this medicine contact a poison control center or emergency room at once. NOTE: This   medicine is only for you. Do not share this medicine with others. What if I miss a dose? If you miss a dose, take it as soon as you can. If it is almost time for your next dose, take only that dose. Do not take double or extra doses. What may interact with this medicine? Do not take this medicine with any of the following medications: -chasteberry This medicine may also interact with the following medications: -herbal or dietary supplements, like  black cohosh or DHEA -male hormones, like estrogens or progestins and birth control pills, patches, rings, or injections -male hormones, like testosterone This list may not describe all possible interactions. Give your health care provider a list of all the medicines, herbs, non-prescription drugs, or dietary supplements you use. Also tell them if you smoke, drink alcohol, or use illegal drugs. Some items may interact with your medicine. What should I watch for while using this medicine? Visit your doctor or health care professional for regular checks on your progress. During the first week, your symptoms may get worse, but then will improve as you continue your treatment. You may get hot flashes, increased bone pain, increased difficulty passing urine, or an aggravation of nerve symptoms. Discuss these effects with your doctor or health care professional, some of them may improve with continued use of this medicine. Male patients may experience a menstrual cycle or spotting during the first 2 months of therapy with this medicine. If this continues, contact your doctor or health care professional. What side effects may I notice from receiving this medicine? Side effects that you should report to your doctor or health care professional as soon as possible: -allergic reactions like skin rash, itching or hives, swelling of the face, lips, or tongue -breathing problems -chest pain -depression or memory disorders -pain in your legs or groin -pain at site where injected -severe headache -swelling of the feet and legs -visual changes -vomiting Side effects that usually do not require medical attention (report to your doctor or health care professional if they continue or are bothersome): -breast swelling or tenderness -decrease in sex drive or performance -diarrhea -hot flashes -loss of appetite -muscle, joint, or bone pains -nausea -redness or irritation at site where injected -skin  problems or acne This list may not describe all possible side effects. Call your doctor for medical advice about side effects. You may report side effects to FDA at 1-800-FDA-1088. Where should I keep my medicine? Keep out of the reach of children. Store below 25 degrees C (77 degrees F). Do not freeze. Protect from light. Do not use if it is not clear or if there are particles present. Throw away any unused medicine after the expiration date. NOTE: This sheet is a summary. It may not cover all possible information. If you have questions about this medicine, talk to your doctor, pharmacist, or health care provider.  2018 Elsevier/Gold Standard (2015-08-22 10:54:35)

## 2017-11-16 LAB — PSA, TOTAL AND FREE
PSA, Free Pct: UNDETERMINED %
PSA, Free: <0.02 ng/mL
Prostate Specific Ag, Serum: <0.1 ng/mL (ref 0.0–4.0)

## 2017-11-16 LAB — TESTOSTERONE: Testosterone: <3 ng/dL — ABNORMAL LOW (ref 264–916)

## 2017-11-18 LAB — CHROMOGRANIN A: Chromogranin A: 2 nmol/L (ref 0–5)

## 2017-11-29 ENCOUNTER — Ambulatory Visit (HOSPITAL_COMMUNITY)
Admission: RE | Admit: 2017-11-29 | Discharge: 2017-11-29 | Disposition: A | Discharge status: Home / Self Care | Payer: Medicare Other | Source: Ambulatory Visit | Attending: Hematology & Oncology | Admitting: Hematology & Oncology

## 2017-11-29 DIAGNOSIS — C7A8 Other malignant neuroendocrine tumors: Secondary | ICD-10-CM | POA: Insufficient documentation

## 2017-11-29 DIAGNOSIS — C7800 Secondary malignant neoplasm of unspecified lung: Secondary | ICD-10-CM | POA: Diagnosis not present

## 2017-11-29 DIAGNOSIS — C7A1 Malignant poorly differentiated neuroendocrine tumors: Secondary | ICD-10-CM | POA: Diagnosis not present

## 2017-11-29 MED ORDER — GALLIUM GA 68 DOTATATE IV KIT
3.8000 | PACK | Freq: Once | INTRAVENOUS | Status: AC
  Administered 2017-11-29: 3.8 via INTRAVENOUS

## 2017-12-09 ENCOUNTER — Inpatient Hospital Stay (HOSPITAL_BASED_OUTPATIENT_CLINIC_OR_DEPARTMENT_OTHER): Payer: Medicare Other | Admitting: Hematology & Oncology

## 2017-12-09 ENCOUNTER — Other Ambulatory Visit: Payer: Self-pay

## 2017-12-09 ENCOUNTER — Inpatient Hospital Stay: Payer: Medicare Other

## 2017-12-09 VITALS — BP 146/76 | HR 91 | Temp 98.2°F | Resp 20 | Wt 157.8 lb

## 2017-12-09 DIAGNOSIS — C7A8 Other malignant neuroendocrine tumors: Secondary | ICD-10-CM

## 2017-12-09 DIAGNOSIS — C61 Malignant neoplasm of prostate: Secondary | ICD-10-CM | POA: Diagnosis not present

## 2017-12-09 DIAGNOSIS — C7951 Secondary malignant neoplasm of bone: Secondary | ICD-10-CM | POA: Diagnosis not present

## 2017-12-09 DIAGNOSIS — Z7984 Long term (current) use of oral hypoglycemic drugs: Secondary | ICD-10-CM

## 2017-12-09 DIAGNOSIS — Z79899 Other long term (current) drug therapy: Secondary | ICD-10-CM

## 2017-12-09 DIAGNOSIS — R531 Weakness: Secondary | ICD-10-CM

## 2017-12-09 DIAGNOSIS — R918 Other nonspecific abnormal finding of lung field: Secondary | ICD-10-CM | POA: Diagnosis not present

## 2017-12-09 DIAGNOSIS — R05 Cough: Secondary | ICD-10-CM

## 2017-12-09 DIAGNOSIS — E119 Type 2 diabetes mellitus without complications: Secondary | ICD-10-CM | POA: Diagnosis not present

## 2017-12-09 DIAGNOSIS — Z7982 Long term (current) use of aspirin: Secondary | ICD-10-CM | POA: Diagnosis not present

## 2017-12-09 LAB — CBC WITH DIFFERENTIAL (CANCER CENTER ONLY)
Abs Immature Granulocytes: 0.01 10*3/uL (ref 0.00–0.07)
Basophils Absolute: 0 10*3/uL (ref 0.0–0.1)
Basophils Relative: 0 %
Eosinophils Absolute: 0.3 10*3/uL (ref 0.0–0.5)
Eosinophils Relative: 6 %
HCT: 35.5 % — ABNORMAL LOW (ref 39.0–52.0)
Hemoglobin: 11.1 g/dL — ABNORMAL LOW (ref 13.0–17.0)
Immature Granulocytes: 0 %
Lymphocytes Relative: 21 %
Lymphs Abs: 1.1 10*3/uL (ref 0.7–4.0)
MCH: 31.4 pg (ref 26.0–34.0)
MCHC: 31.3 g/dL (ref 30.0–36.0)
MCV: 100.3 fL — ABNORMAL HIGH (ref 80.0–100.0)
Monocytes Absolute: 0.5 10*3/uL (ref 0.1–1.0)
Monocytes Relative: 10 %
Neutro Abs: 3.3 10*3/uL (ref 1.7–7.7)
Neutrophils Relative %: 63 %
Platelet Count: 300 10*3/uL (ref 150–400)
RBC: 3.54 MIL/uL — ABNORMAL LOW (ref 4.22–5.81)
RDW: 12.1 % (ref 11.5–15.5)
WBC Count: 5.3 10*3/uL (ref 4.0–10.5)
nRBC: 0 % (ref 0.0–0.2)

## 2017-12-09 LAB — CMP (CANCER CENTER ONLY)
ALT: 20 U/L (ref 10–47)
AST: 31 U/L (ref 11–38)
Albumin: 3.6 g/dL (ref 3.5–5.0)
Alkaline Phosphatase: 72 U/L (ref 26–84)
Anion gap: 5 (ref 5–15)
BUN: 23 mg/dL — ABNORMAL HIGH (ref 7–22)
CO2: 31 mmol/L (ref 18–33)
Calcium: 10.7 mg/dL — ABNORMAL HIGH (ref 8.0–10.3)
Chloride: 106 mmol/L (ref 98–108)
Creatinine: 1.4 mg/dL — ABNORMAL HIGH (ref 0.60–1.20)
Glucose, Bld: 108 mg/dL (ref 73–118)
Potassium: 4.6 mmol/L (ref 3.3–4.7)
Sodium: 142 mmol/L (ref 128–145)
Total Bilirubin: 0.5 mg/dL (ref 0.2–1.6)
Total Protein: 7.2 g/dL (ref 6.4–8.1)

## 2017-12-09 MED FILL — GABAPENTIN 300 MG CAPSULE: 300 | 30 days supply | Qty: 120 | Fill #2

## 2017-12-09 MED FILL — GLIMEPIRIDE 2 MG TABLET: 2 | 30 days supply | Qty: 30 | Fill #4

## 2017-12-09 NOTE — Progress Notes (Signed)
Hematology and Oncology Follow Up Visit  [name] [id number] [birth year] 72 y.o. 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 is back for followup.  Unfortunately, looks like we may have an issue.  He had his dotatate PET scan done on 11/29/2017.  This showed he had new and larger pulmonary nodules.  The radiologist said there are approximately 20 nodules per lung.  There is some activity in the right sacral lesion with an SUV of 4.2.  Is hard to say exactly what this signifies but will not have to worry that he has progressive disease.  I talked to he, his wife and his sister at length.  He is totally asymptomatic.  He feels good.  He has had no shortness of breath.  He had a little bit of a cough at nighttime.  He has had no hemoptysis.  There is been no weight loss or weight gain.  He has had no nausea or vomiting.  His right leg is still weak but this will be a chronic issue but this really has not bothered him too much.  Given that he is totally asymptomatic right now, I think we can just follow this.  These lung nodules are so tiny that he would not be bothered by them.  There is no fluid in the lungs.  There is no obvious airway impingement or compression.  Overall, his performance status is ECOG 1.    Medications:  Current Outpatient Medications:  .  acetaminophen (TYLENOL) 500 MG tablet, Take 1,000 mg by mouth every 4 (four) hours as needed for moderate pain., Disp:  , Rfl:  .  aspirin 81 MG tablet, Take 81 mg by mouth daily., Disp: , Rfl:  .  CALCIUM-IRON-VIT D-VIT K PO, Take 1 tablet by mouth 2 (two) times daily., Disp: , Rfl:  .  gabapentin (NEURONTIN) 300 MG capsule, TAKE 1 CAPSULE BY MOUTH TWICE A DAY THEN TAKE 2 CAPSULES AT BEDTIME (Patient taking differently: Take 2 capsule by mouth in the morning and 2 capsules at night.), Disp: 120 capsule, Rfl: 4 .  glimepiride (AMARYL) 2 MG tablet, Take 1 tablet (2 mg total) by mouth daily with breakfast., Disp: 30 tablet, Rfl: 4 .  ondansetron (ZOFRAN) 8 MG tablet, Take 1 tablet (8 mg total) by mouth 2 (two) times daily as needed for nausea or vomiting., Disp: 20 tablet, Rfl: 0 .  OVER THE COUNTER MEDICATION, every morning. Juice Plus -- 8 capsule of each the garden, vineyard, and orchead, Disp: , Rfl:  .  UNABLE TO FIND, Med Name: Juice Plus Omega Blend, Disp: , Rfl:  .  UNABLE TO FIND, Med Name: Juice Plus [employer], Garden Blend, [employer], Disp: , Rfl:  .  VESICARE 5 MG tablet, TAKE 1 TABLET BY MOUTH DAILY., Disp: 30 tablet, Rfl: 4 .  ZETIA 10 MG tablet, Take 10 mg by mouth daily. , Disp: , Rfl:  No current facility-administered medications for this visit.   Facility-Administered Medications Ordered in Other Visits:  .  octreotide (SANDOSTATIN LAR) IM injection 30 mg, 30 mg, Intramuscular, Once, Brenlyn Beshara, Rudell Cobb, MD  Allergies:  Allergies  Allergen Reactions  . Codeine Nausea And Vomiting  . Hydrocodone Nausea And Vomiting    Past Medical History, Surgical history, Social history, and Family History were reviewed and updated.  Review of Systems: Review of Systems  Constitutional: Negative.   HENT: Negative.   Eyes: Negative.   Respiratory: Negative.   Cardiovascular: Negative.   Gastrointestinal: Negative.   Genitourinary: Negative.  Negative for urgency.  Musculoskeletal: Positive for joint pain.  Skin: Negative.   Neurological: Positive for focal weakness.  Endo/Heme/Allergies:  Negative.   Psychiatric/Behavioral: Negative.   All other systems reviewed and are negative.    Physical Exam:  weight is 157 lb 12 oz (71.6 kg). His oral temperature is 98.2 F (36.8 C). His blood pressure is 146/76 (abnormal) and his pulse is 91. His respiration is 20 and oxygen saturation is 100%.   Physical Exam  Constitutional: He is oriented to person, place, and time.  HENT:  Head: Normocephalic and atraumatic.  Mouth/Throat: Oropharynx is clear and moist.  Eyes: Pupils are equal, round, and reactive to light. EOM are normal.  Neck: Normal range of motion.  Cardiovascular: Normal rate, regular rhythm and normal heart sounds.  Pulmonary/Chest: Effort normal and breath sounds normal.  Abdominal: Soft. Bowel sounds are normal.  Musculoskeletal: Normal range of motion. He exhibits no edema, tenderness or deformity.  Lymphadenopathy:    He has no cervical adenopathy.  Neurological: He is alert and oriented to person, place, and time.  Skin: Skin is warm and dry. No rash noted. No erythema.  Psychiatric: He has a normal mood and affect. His behavior is normal. Judgment and thought content normal.  Vitals reviewed.    Lab Results  Component Value Date   WBC 5.3 12/09/2017   HGB 11.1 (L) 12/09/2017   HCT 35.5 (L) 12/09/2017   MCV 100.3 (H) 12/09/2017   PLT 300 12/09/2017     Chemistry      Component Value Date/Time   NA 144 11/15/2017 0937   NA 144 12/14/2016 1159   NA 140 10/22/2016 1129   K 3.8 11/15/2017 0937   K 3.5 12/14/2016 1159   K 4.3 10/22/2016 1129   CL 102 11/15/2017 0937   CL 103 12/14/2016 1159   CO2 27 11/15/2017 0937   CO2 30 12/14/2016 1159   CO2 27 10/22/2016 1129   BUN 23 (H) 11/15/2017 0937   BUN 16 12/14/2016 1159   BUN 14.0 10/22/2016 1129   CREATININE 1.40 (H) 11/15/2017 0937   CREATININE 1.3 (H) 12/14/2016 1159   CREATININE 1.1 10/22/2016 1129      Component Value Date/Time   CALCIUM 10.4 (H) 11/15/2017 0937   CALCIUM 10.0 12/14/2016  1159   CALCIUM 10.4 10/22/2016 1129   ALKPHOS 63 11/15/2017 0937   ALKPHOS 48 12/14/2016 1159   ALKPHOS 60 10/22/2016 1129   AST 27 11/15/2017 0937   AST 16 10/22/2016 1129   ALT 24 11/15/2017 0937   ALT 23 12/14/2016 1159   ALT 14 10/22/2016 1129   BILITOT 0.5 11/15/2017 0937   BILITOT 0.31 10/22/2016 1129         Impression and Plan: [name] is 72 year old gentleman with metastatic prostate cancer.  This is actually a neuroendocrine variant.  I think that his prostate cancer must  have dedifferentiated a little bit.  Again, I think that we will repeat the dotatate PET scan in January.  I think this will then it was a very good idea as to how quickly or how slowly we are seeing progressive disease.  I think if we do see progressive disease, we will have to think about treating this as a small cell carcinoma.  I would consider chemotherapy with immunotherapy as this combination has been very effective for small cell lung cancer.  Since he is having no symptoms at all, I just do not think that we would be benefiting him by treating him right now.  I want him to enjoy the holidays.  I think that he would be able to enjoy the holidays without having to have treatment right now.  When he comes back in January, he will get his Delton See and his Lupron injections.  Overall, I think that he has had a good year.  He really has done nicely considering how things started out.    [name], MD 11/25/20192:22 PM

## 2017-12-11 LAB — CHROMOGRANIN A: Chromogranin A: 2 nmol/L (ref 0–5)

## 2017-12-18 ENCOUNTER — Encounter: Payer: Self-pay | Admitting: Hematology & Oncology

## 2017-12-18 ENCOUNTER — Telehealth: Payer: Self-pay | Admitting: Hematology & Oncology

## 2017-12-18 NOTE — Telephone Encounter (Signed)
Appt for PET/NETSPOT scheduled for 1/7 @ 3:00 and patient's wife Freda Munro was notified per 12/4 patient message

## 2018-01-03 MED FILL — GABAPENTIN 300 MG CAPSULE: 300 | 30 days supply | Qty: 120 | Fill #3

## 2018-01-20 ENCOUNTER — Ambulatory Visit: Payer: Medicare Other | Admitting: Hematology & Oncology

## 2018-01-20 ENCOUNTER — Ambulatory Visit: Payer: Medicare Other

## 2018-01-20 ENCOUNTER — Other Ambulatory Visit: Payer: Medicare Other

## 2018-01-21 ENCOUNTER — Encounter (HOSPITAL_COMMUNITY)
Admission: RE | Admit: 2018-01-21 | Discharge: 2018-01-21 | Disposition: A | Discharge status: Home / Self Care | Payer: Medicare Other | Source: Ambulatory Visit | Attending: Hematology & Oncology | Admitting: Hematology & Oncology

## 2018-01-21 DIAGNOSIS — C7A8 Other malignant neuroendocrine tumors: Secondary | ICD-10-CM | POA: Diagnosis not present

## 2018-01-21 DIAGNOSIS — C7800 Secondary malignant neoplasm of unspecified lung: Secondary | ICD-10-CM | POA: Diagnosis not present

## 2018-01-21 DIAGNOSIS — C7A1 Malignant poorly differentiated neuroendocrine tumors: Secondary | ICD-10-CM | POA: Diagnosis not present

## 2018-01-21 MED ORDER — GALLIUM GA 68 DOTATATE IV KIT
3.8600 | PACK | Freq: Once | INTRAVENOUS | Status: AC
  Administered 2018-01-21: 3.86 via INTRAVENOUS

## 2018-01-24 ENCOUNTER — Other Ambulatory Visit: Payer: Self-pay

## 2018-01-24 ENCOUNTER — Other Ambulatory Visit: Payer: Self-pay | Admitting: Hematology & Oncology

## 2018-01-24 ENCOUNTER — Encounter: Payer: Self-pay | Admitting: Hematology & Oncology

## 2018-01-24 ENCOUNTER — Inpatient Hospital Stay (HOSPITAL_BASED_OUTPATIENT_CLINIC_OR_DEPARTMENT_OTHER): Payer: Medicare Other | Admitting: Hematology & Oncology

## 2018-01-24 ENCOUNTER — Ambulatory Visit: Payer: Medicare Other

## 2018-01-24 ENCOUNTER — Inpatient Hospital Stay: Payer: Medicare Other | Attending: Hematology & Oncology

## 2018-01-24 VITALS — BP 136/80 | HR 86 | Resp 16 | Wt 152.0 lb

## 2018-01-24 DIAGNOSIS — C7A8 Other malignant neuroendocrine tumors: Secondary | ICD-10-CM

## 2018-01-24 DIAGNOSIS — Z7982 Long term (current) use of aspirin: Secondary | ICD-10-CM | POA: Insufficient documentation

## 2018-01-24 DIAGNOSIS — E119 Type 2 diabetes mellitus without complications: Secondary | ICD-10-CM | POA: Diagnosis not present

## 2018-01-24 DIAGNOSIS — C61 Malignant neoplasm of prostate: Secondary | ICD-10-CM | POA: Diagnosis not present

## 2018-01-24 DIAGNOSIS — Z5112 Encounter for antineoplastic immunotherapy: Secondary | ICD-10-CM | POA: Insufficient documentation

## 2018-01-24 DIAGNOSIS — Z7984 Long term (current) use of oral hypoglycemic drugs: Secondary | ICD-10-CM | POA: Diagnosis not present

## 2018-01-24 DIAGNOSIS — C7800 Secondary malignant neoplasm of unspecified lung: Secondary | ICD-10-CM | POA: Diagnosis not present

## 2018-01-24 DIAGNOSIS — C3490 Malignant neoplasm of unspecified part of unspecified bronchus or lung: Secondary | ICD-10-CM | POA: Insufficient documentation

## 2018-01-24 DIAGNOSIS — R531 Weakness: Secondary | ICD-10-CM | POA: Insufficient documentation

## 2018-01-24 DIAGNOSIS — Z5111 Encounter for antineoplastic chemotherapy: Secondary | ICD-10-CM | POA: Diagnosis not present

## 2018-01-24 DIAGNOSIS — C7951 Secondary malignant neoplasm of bone: Secondary | ICD-10-CM | POA: Diagnosis not present

## 2018-01-24 DIAGNOSIS — Z7189 Other specified counseling: Secondary | ICD-10-CM

## 2018-01-24 DIAGNOSIS — Z5189 Encounter for other specified aftercare: Secondary | ICD-10-CM | POA: Insufficient documentation

## 2018-01-24 DIAGNOSIS — Z79899 Other long term (current) drug therapy: Secondary | ICD-10-CM | POA: Diagnosis not present

## 2018-01-24 LAB — CMP (CANCER CENTER ONLY)
ALT: 11 U/L (ref 0–44)
AST: 17 U/L (ref 15–41)
Albumin: 4.3 g/dL (ref 3.5–5.0)
Alkaline Phosphatase: 54 U/L (ref 38–126)
Anion gap: 7 (ref 5–15)
BUN: 19 mg/dL (ref 8–23)
CO2: 30 mmol/L (ref 22–32)
Calcium: 9.8 mg/dL (ref 8.9–10.3)
Chloride: 103 mmol/L (ref 98–111)
Creatinine: 1.2 mg/dL (ref 0.61–1.24)
GFR, Est AFR Am: >60 mL/min (ref >60)
GFR, Estimated: >60 mL/min (ref >60)
Glucose, Bld: 104 mg/dL — ABNORMAL HIGH (ref 70–99)
Potassium: 4.1 mmol/L (ref 3.5–5.1)
Sodium: 140 mmol/L (ref 135–145)
Total Bilirubin: 0.4 mg/dL (ref 0.3–1.2)
Total Protein: 7.2 g/dL (ref 6.5–8.1)

## 2018-01-24 LAB — CBC WITH DIFFERENTIAL (CANCER CENTER ONLY)
Abs Immature Granulocytes: 0.01 10*3/uL (ref 0.00–0.07)
Basophils Absolute: 0 10*3/uL (ref 0.0–0.1)
Basophils Relative: 1 %
Eosinophils Absolute: 0.2 10*3/uL (ref 0.0–0.5)
Eosinophils Relative: 3 %
HCT: 36.2 % — ABNORMAL LOW (ref 39.0–52.0)
Hemoglobin: 11.5 g/dL — ABNORMAL LOW (ref 13.0–17.0)
Immature Granulocytes: 0 %
Lymphocytes Relative: 19 %
Lymphs Abs: 1.1 10*3/uL (ref 0.7–4.0)
MCH: 31.1 pg (ref 26.0–34.0)
MCHC: 31.8 g/dL (ref 30.0–36.0)
MCV: 97.8 fL (ref 80.0–100.0)
Monocytes Absolute: 0.5 10*3/uL (ref 0.1–1.0)
Monocytes Relative: 9 %
Neutro Abs: 3.8 10*3/uL (ref 1.7–7.7)
Neutrophils Relative %: 68 %
Platelet Count: 271 10*3/uL (ref 150–400)
RBC: 3.7 MIL/uL — ABNORMAL LOW (ref 4.22–5.81)
RDW: 12.8 % (ref 11.5–15.5)
WBC Count: 5.6 10*3/uL (ref 4.0–10.5)
nRBC: 0 % (ref 0.0–0.2)

## 2018-01-24 LAB — LACTATE DEHYDROGENASE: LDH: 217 U/L — ABNORMAL HIGH (ref 98–192)

## 2018-01-24 MED FILL — GLIMEPIRIDE 2 MG TABLET: 2 | 30 days supply | Qty: 30 | Fill #0

## 2018-01-24 NOTE — Progress Notes (Addendum)
Hematology and Oncology Follow Up Visit  [name] [id number] [birth year] 73 y.o. 01/24/2018   Principle Diagnosis:  Metastatic small cell carcinoma --unknown primary  Metastatic Prostate cancer -- castrate sensitive   Current Therapy:    Zytiga 1000 mg by mouth daily - discontinued on 08/15/2016  Xtandi 160 mg po q day - start on 08/15/2016 - discontinued  Xgeva 120 mg subcutaneous Q3 months - due in Jan 2020  Lupron 22 mg IM every 3 months - due in Jan 2020  Radium-223 therapy -- s/p cycle #6  Palliative radiation to the right sacrum  Lutathera - s/p cycle 4/4 --last dose on 07/11/2017     Interim History:  [name] is back for followup.  He looks like he does have some progressive disease.  We did do a PET scan on him.  This was done on 01/21/2018.  This showed mild progression of pulmonary metastasis.  No new lesions are noted.  He has 25 lesions per lung.  There is some activity in the right sacral lesion.  He has no new lesions on skull base to thigh scan.  I think we are at the point now where we are going to have to consider systemic chemotherapy.  When we did molecular testing on his tumor a year ago, there was nothing that we could target.  There was no PD-L1 activity.  He had a low TMB.  As this appears to be some type of neuroendocrine tumor, I talked to he and his family about systemic chemotherapy.  I talked about using carboplatinum/etoposide.  I am not sure if adding immunotherapy is going to make any additional improvement.  He will need to have a Port-A-Cath placed.  He has no pulmonary symptoms.  He has no cough.  There is no shortness of breath.  He has no chest wall pain.  He still has some weakness with his right leg.  He is still working.  He would like to try to work as much as he could.  He is going to have a very nice Thanksgiving and Christmas holiday.  He has had good blood sugar control.  He has had no change in bowel or bladder  habits.  Overall, his performance status is ECOG 1.    Medications:  Current Outpatient Medications:  .  acetaminophen (TYLENOL) 500 MG tablet, Take 1,000 mg by mouth every 4 (four) hours as needed for moderate pain., Disp: , Rfl:  .  aspirin 81 MG tablet, Take 81 mg by mouth daily., Disp: , Rfl:  .  CALCIUM-IRON-VIT D-VIT K PO, Take 1 tablet by mouth 2 (two) times daily., Disp: , Rfl:  .  gabapentin (NEURONTIN) 300 MG capsule, TAKE 1 CAPSULE BY MOUTH TWICE A DAY THEN TAKE 2 CAPSULES AT BEDTIME (Patient taking differently: Take 2 capsule by mouth in the morning and 2 capsules at night.), Disp: 120 capsule, Rfl: 4 .  glimepiride (AMARYL) 2 MG tablet, TAKE 1 TABLET (2 MG TOTAL) BY MOUTH DAILY WITH BREAKFAST., Disp: 30 tablet, Rfl: 4 .  ondansetron (ZOFRAN) 8 MG tablet, Take 1 tablet (8 mg total) by mouth 2 (two) times daily as needed for nausea or vomiting., Disp: 20 tablet, Rfl: 0 .  OVER THE COUNTER MEDICATION, every morning. Juice Plus -- 8 capsule of each the garden, vineyard, and orchead, Disp: , Rfl:  .  UNABLE TO FIND, Med Name: Juice Plus Omega Blend, Disp: , Rfl:  .  UNABLE TO FIND, Med Name: Juice  Plus [employer], Garden Blend, [employer], Disp: , Rfl:  .  ZETIA 10 MG tablet, Take 10 mg by mouth daily. , Disp: , Rfl:  No current facility-administered medications for this visit.   Facility-Administered Medications Ordered in Other Visits:  .  octreotide (SANDOSTATIN LAR) IM injection 30 mg, 30 mg, Intramuscular, Once, Ennever, Rudell Cobb, MD  Allergies:  Allergies  Allergen Reactions  . Codeine Nausea And Vomiting  . Hydrocodone Nausea And Vomiting    Past Medical History, Surgical history, Social history, and Family History were reviewed and updated.  Review of Systems: Review of Systems  Constitutional: Negative.   HENT: Negative.   Eyes: Negative.   Respiratory: Negative.   Cardiovascular: Negative.   Gastrointestinal: Negative.   Genitourinary: Negative.   Negative for urgency.  Musculoskeletal: Positive for joint pain.  Skin: Negative.   Neurological: Positive for focal weakness.  Endo/Heme/Allergies: Negative.   Psychiatric/Behavioral: Negative.   All other systems reviewed and are negative.    Physical Exam:  weight is 152 lb (68.9 kg). His blood pressure is 136/80 and his pulse is 86. His respiration is 16.   Physical Exam Vitals signs reviewed.  HENT:     Head: Normocephalic and atraumatic.  Eyes:     Pupils: Pupils are equal, round, and reactive to light.  Neck:     Musculoskeletal: Normal range of motion.  Cardiovascular:     Rate and Rhythm: Normal rate and regular rhythm.     Heart sounds: Normal heart sounds.  Pulmonary:     Effort: Pulmonary effort is normal.     Breath sounds: Normal breath sounds.  Abdominal:     General: Bowel sounds are normal.     Palpations: Abdomen is soft.  Musculoskeletal: Normal range of motion.        General: No tenderness or deformity.  Lymphadenopathy:     Cervical: No cervical adenopathy.  Skin:    General: Skin is warm and dry.     Findings: No erythema or rash.  Neurological:     Mental Status: He is alert and oriented to person, place, and time.  Psychiatric:        Behavior: Behavior normal.        Thought Content: Thought content normal.        Judgment: Judgment normal.      Lab Results  Component Value Date   WBC 5.6 01/24/2018   HGB 11.5 (L) 01/24/2018   HCT 36.2 (L) 01/24/2018   MCV 97.8 01/24/2018   PLT 271 01/24/2018     Chemistry      Component Value Date/Time   NA 140 01/24/2018 1022   NA 144 12/14/2016 1159   NA 140 10/22/2016 1129   K 4.1 01/24/2018 1022   K 3.5 12/14/2016 1159   K 4.3 10/22/2016 1129   CL 103 01/24/2018 1022   CL 103 12/14/2016 1159   CO2 30 01/24/2018 1022   CO2 30 12/14/2016 1159   CO2 27 10/22/2016 1129   BUN 19 01/24/2018 1022   BUN 16 12/14/2016 1159   BUN 14.0 10/22/2016 1129   CREATININE 1.20 01/24/2018 1022    CREATININE 1.3 (H) 12/14/2016 1159   CREATININE 1.1 10/22/2016 1129      Component Value Date/Time   CALCIUM 9.8 01/24/2018 1022   CALCIUM 10.0 12/14/2016 1159   CALCIUM 10.4 10/22/2016 1129   ALKPHOS 54 01/24/2018 1022   ALKPHOS 48 12/14/2016 1159   ALKPHOS 60 10/22/2016 1129  AST 17 01/24/2018 1022   AST 16 10/22/2016 1129   ALT 11 01/24/2018 1022   ALT 23 12/14/2016 1159   ALT 14 10/22/2016 1129   BILITOT 0.4 01/24/2018 1022   BILITOT 0.31 10/22/2016 1129         Impression and Plan: Mr. 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  He also has metastatic prostate cancer.  He is castrate resistant.    He will think about treatment over the weekend.  He says he will give me a call next week and let me know what he decides.  I realize that he is asymptomatic.  However, with such disease in his lungs, I would worry that he would begin to have problems if his tumor progressed.  I would not want to see him developed symptoms with respect to shortness of breath, pain, potential pleural effusion development.  I spent about an hour with he and his family.  All the time spent face-to-face.  I reviewed his PET scan.  I went over his labs.  I explained how chemotherapy works.  I told him that I would think that treatment would help but that this would certainly not cure his disease.  Our goal of care clearly is quality of life.  He knows that we are not going to cure this.  I want to make sure that our treatment is reflective of his priority which is that he have a good quality of life so we can still work and still enjoy family events.  Apparently, there is a big wedding that the family will have in June that he wants to make sure that he can get to.  If he decides on treatment, we probably will start treatment in a couple weeks.  I will tentatively plan to get him back to see [place] the day that he starts treatment.Marland Kitchen    [name], MD 1/10/202011:17 AM

## 2018-01-25 LAB — TESTOSTERONE: Testosterone: <3 ng/dL — ABNORMAL LOW (ref 264–916)

## 2018-01-27 ENCOUNTER — Telehealth: Payer: Self-pay | Admitting: *Deleted

## 2018-01-27 LAB — CHROMOGRANIN A: Chromogranin A: 104 ng/mL — ABNORMAL HIGH (ref 0.0–101.8)

## 2018-01-27 NOTE — Telephone Encounter (Signed)
Message received from patient to inform Dr. Marin Olp that patient would like to start chemotherapy.  Dr. Marin Olp notified.  Call placed back to patient to inform him that message was received and given to Dr. Marin Olp.  Patient appreciative of call back and has no further questions at this time.

## 2018-01-29 ENCOUNTER — Encounter: Payer: Self-pay | Admitting: Hematology & Oncology

## 2018-01-29 DIAGNOSIS — Z7189 Other specified counseling: Secondary | ICD-10-CM

## 2018-01-29 HISTORY — DX: Other specified counseling: Z71.89

## 2018-01-29 NOTE — Progress Notes (Signed)
START ON PATHWAY REGIMEN - Small Cell Lung     Cycles 1 through 4, every 21 days:     Atezolizumab      Carboplatin      Etoposide    Cycles 5 and beyond, every 21 days:     Atezolizumab   **Always confirm dose/schedule in your pharmacy ordering system**  Patient Characteristics: Newly Diagnosed, Preoperative or Nonsurgical Candidate (Clinical Staging), First Line, Extensive Stage Therapeutic Status: Newly Diagnosed, Preoperative or Nonsurgical Candidate (Clinical Staging) AJCC T Category: cT2b AJCC N Category: cN3 AJCC M Category: pM1c AJCC 8 Stage Grouping: IVB Stage Classification: Extensive Intent of Therapy: Non-Curative / Palliative Intent, Discussed with Patient

## 2018-01-29 NOTE — Addendum Note (Signed)
Addended by: [name] on: 01/29/2018 05:23 PM   Modules accepted: Orders

## 2018-01-30 ENCOUNTER — Telehealth: Payer: Self-pay | Admitting: Hematology & Oncology

## 2018-01-30 NOTE — Telephone Encounter (Signed)
Spoke with patient to confirm appts 02/12/18 at 0830 per 1/15 sch msg

## 2018-02-07 ENCOUNTER — Other Ambulatory Visit: Payer: Self-pay | Admitting: Physician Assistant

## 2018-02-10 ENCOUNTER — Ambulatory Visit (HOSPITAL_COMMUNITY)
Admission: RE | Admit: 2018-02-10 | Discharge: 2018-02-10 | Disposition: A | Discharge status: Home / Self Care | Payer: Medicare Other | Source: Ambulatory Visit | Attending: Hematology & Oncology | Admitting: Hematology & Oncology

## 2018-02-10 ENCOUNTER — Encounter (HOSPITAL_COMMUNITY): Payer: Self-pay

## 2018-02-10 ENCOUNTER — Other Ambulatory Visit: Payer: Self-pay | Admitting: Hematology & Oncology

## 2018-02-10 ENCOUNTER — Other Ambulatory Visit: Payer: Self-pay | Admitting: Radiology

## 2018-02-10 DIAGNOSIS — Z885 Allergy status to narcotic agent status: Secondary | ICD-10-CM | POA: Insufficient documentation

## 2018-02-10 DIAGNOSIS — Z8261 Family history of arthritis: Secondary | ICD-10-CM | POA: Insufficient documentation

## 2018-02-10 DIAGNOSIS — Z8546 Personal history of malignant neoplasm of prostate: Secondary | ICD-10-CM | POA: Diagnosis not present

## 2018-02-10 DIAGNOSIS — E119 Type 2 diabetes mellitus without complications: Secondary | ICD-10-CM | POA: Insufficient documentation

## 2018-02-10 DIAGNOSIS — D3A8 Other benign neuroendocrine tumors: Secondary | ICD-10-CM | POA: Diagnosis not present

## 2018-02-10 DIAGNOSIS — Z9079 Acquired absence of other genital organ(s): Secondary | ICD-10-CM | POA: Diagnosis not present

## 2018-02-10 DIAGNOSIS — I10 Essential (primary) hypertension: Secondary | ICD-10-CM | POA: Diagnosis not present

## 2018-02-10 DIAGNOSIS — Z8042 Family history of malignant neoplasm of prostate: Secondary | ICD-10-CM | POA: Diagnosis not present

## 2018-02-10 DIAGNOSIS — C7A8 Other malignant neuroendocrine tumors: Secondary | ICD-10-CM | POA: Diagnosis not present

## 2018-02-10 DIAGNOSIS — Z8249 Family history of ischemic heart disease and other diseases of the circulatory system: Secondary | ICD-10-CM | POA: Insufficient documentation

## 2018-02-10 DIAGNOSIS — Z7982 Long term (current) use of aspirin: Secondary | ICD-10-CM | POA: Insufficient documentation

## 2018-02-10 DIAGNOSIS — Z5111 Encounter for antineoplastic chemotherapy: Secondary | ICD-10-CM | POA: Diagnosis not present

## 2018-02-10 HISTORY — PX: IR IMAGING GUIDED PORT INSERTION: IMG5740

## 2018-02-10 LAB — APTT: aPTT: 30 seconds (ref 24–36)

## 2018-02-10 LAB — CBC
HCT: 37.9 % — ABNORMAL LOW (ref 39.0–52.0)
Hemoglobin: 12.1 g/dL — ABNORMAL LOW (ref 13.0–17.0)
MCH: 31.1 pg (ref 26.0–34.0)
MCHC: 31.9 g/dL (ref 30.0–36.0)
MCV: 97.4 fL (ref 80.0–100.0)
Platelets: 270 10*3/uL (ref 150–400)
RBC: 3.89 MIL/uL — ABNORMAL LOW (ref 4.22–5.81)
RDW: 13.2 % (ref 11.5–15.5)
WBC: 5.5 10*3/uL (ref 4.0–10.5)
nRBC: 0 % (ref 0.0–0.2)

## 2018-02-10 LAB — PROTIME-INR
INR: 0.99
Prothrombin Time: 13 seconds (ref 11.4–15.2)

## 2018-02-10 LAB — GLUCOSE, CAPILLARY
Glucose-Capillary: 67 mg/dL — ABNORMAL LOW (ref 70–99)
Glucose-Capillary: 114 mg/dL — ABNORMAL HIGH (ref 70–99)

## 2018-02-10 MED ORDER — CEFAZOLIN SODIUM-DEXTROSE 2-4 GM/100ML-% IV SOLN
2.0000 g | Freq: Once | INTRAVENOUS | Status: AC
  Administered 2018-02-10: 2 g via INTRAVENOUS

## 2018-02-10 MED ORDER — HEPARIN SOD (PORK) LOCK FLUSH 100 UNIT/ML IV SOLN
INTRAVENOUS | Status: AC
  Filled 2018-02-10: qty 5

## 2018-02-10 MED ORDER — DEXTROSE 50 % IV SOLN
25.0000 mL | INTRAVENOUS | Status: AC
  Administered 2018-02-10: 25 mL via INTRAVENOUS

## 2018-02-10 MED ORDER — MIDAZOLAM HCL 2 MG/2ML IJ SOLN
INTRAMUSCULAR | Status: AC
  Filled 2018-02-10: qty 4

## 2018-02-10 MED ORDER — MIDAZOLAM HCL 2 MG/2ML IJ SOLN
INTRAMUSCULAR | Status: AC | PRN
  Administered 2018-02-10: 1 mg via INTRAVENOUS

## 2018-02-10 MED ORDER — CEFAZOLIN SODIUM-DEXTROSE 2-4 GM/100ML-% IV SOLN
INTRAVENOUS | Status: AC
  Filled 2018-02-10: qty 100

## 2018-02-10 MED ORDER — FENTANYL CITRATE (PF) 100 MCG/2ML IJ SOLN
INTRAMUSCULAR | Status: AC
  Filled 2018-02-10: qty 4

## 2018-02-10 MED ORDER — FLUMAZENIL 0.5 MG/5ML IV SOLN
INTRAVENOUS | Status: AC
  Filled 2018-02-10: qty 5

## 2018-02-10 MED ORDER — SODIUM CHLORIDE 0.9 % IV SOLN
Freq: Once | INTRAVENOUS | Status: AC
  Administered 2018-02-10: 13:00:00 via INTRAVENOUS

## 2018-02-10 MED ORDER — DEXTROSE 5 % IV SOLN
INTRAVENOUS | Status: DC
  Administered 2018-02-10: 14:00:00 via INTRAVENOUS

## 2018-02-10 MED ORDER — DEXTROSE 50 % IV SOLN
INTRAVENOUS | Status: AC
  Filled 2018-02-10: qty 50

## 2018-02-10 MED ORDER — SODIUM CHLORIDE 0.9 % IV SOLN: INTRAVENOUS | Status: DC

## 2018-02-10 MED ORDER — LIDOCAINE-EPINEPHRINE (PF) 2 %-1:200000 IJ SOLN
INTRAMUSCULAR | Status: AC
  Filled 2018-02-10: qty 20

## 2018-02-10 MED ORDER — FENTANYL CITRATE (PF) 100 MCG/2ML IJ SOLN
INTRAMUSCULAR | Status: AC | PRN
  Administered 2018-02-10: 50 ug via INTRAVENOUS

## 2018-02-10 MED ORDER — NALOXONE HCL 0.4 MG/ML IJ SOLN
INTRAMUSCULAR | Status: AC
  Filled 2018-02-10: qty 1

## 2018-02-10 NOTE — Progress Notes (Signed)
Sugar at 1356 was 114.

## 2018-02-10 NOTE — Discharge Instructions (Signed)
Moderate Conscious Sedation, Adult, Care After These instructions provide you with information about caring for yourself after your procedure. Your health care provider may also give you more specific instructions. Your treatment has been planned according to current medical practices, but problems sometimes occur. Call your health care provider if you have any problems or questions after your procedure. What can I expect after the procedure? After your procedure, it is common:  To feel sleepy for several hours.  To feel clumsy and have poor balance for several hours.  To have poor judgment for several hours.  To vomit if you eat too soon. Follow these instructions at home: For at least 24 hours after the procedure:   Do not: ? Participate in activities where you could fall or become injured. ? Drive. ? Use heavy machinery. ? Drink alcohol. ? Take sleeping pills or medicines that cause drowsiness. ? Make important decisions or sign legal documents. ? Take care of children on your own.  Rest. Eating and drinking  Follow the diet recommended by your health care provider.  If you vomit: ? Drink water, juice, or soup when you can drink without vomiting. ? Make sure you have little or no nausea before eating solid foods. General instructions  Have a responsible adult stay with you until you are awake and alert.  Take over-the-counter and prescription medicines only as told by your health care provider.  If you smoke, do not smoke without supervision.  Keep all follow-up visits as told by your health care provider. This is important. Contact a health care provider if:  You keep feeling nauseous or you keep vomiting.  You feel light-headed.  You develop a rash.  You have a fever. Get help right away if:  You have trouble breathing. This information is not intended to replace advice given to you by your health care provider. Make sure you discuss any questions you have  with your health care provider. Document Released: 10/22/2012 Document Revised: 06/06/2015 Document Reviewed: 04/23/2015 Elsevier Interactive Patient Education  2019 [place] Insertion, Care After This sheet gives you information about how to care for yourself after your procedure. Your health care provider may also give you more specific instructions. If you have problems or questions, contact your health care provider. What can I expect after the procedure? After the procedure, it is common to have:  Discomfort at the port insertion site.  Bruising on the skin over the port. This should improve over 3-4 days. Follow these instructions at home: [facility] care  After your port is placed, you will get a manufacturer's information card. The card has information about your port. Keep this card with you at all times.  Take care of the port as told by your health care provider. Ask your health care provider if you or a family member can get training for taking care of the port at home. A home health care nurse may also take care of the port.  Make sure to remember what type of port you have. Incision care  Follow instructions from your health care provider about how to take care of your port insertion site. Make sure you: ? Wash your hands with soap and water before and after you change your bandage (dressing). If soap and water are not available, use hand sanitizer. ? Change your dressing as told by your health care provider. ? Leave stitches (sutures), skin glue, or adhesive strips in place. These skin closures may need  to stay in place for 2 weeks or longer. If adhesive strip edges start to loosen and curl up, you may trim the loose edges. Do not remove adhesive strips completely unless your health care provider tells you to do that. ? May remove dressing in 24 to 48 hours and bathe or shower.  Keep dressing clean and dry.  Replace dressing with bandaid as needed.   Check  your port insertion site every day for signs of infection. Check for: ? Redness, swelling, or pain. ? Fluid or blood. ? Warmth. ? Pus or a bad smell. Activity  Return to your normal activities as told by your health care provider. Ask your health care provider what activities are safe for you.  Do not lift anything that is heavier than 10 lb (4.5 kg), or the limit that you are told, until your health care provider says that it is safe. General instructions  Take over-the-counter and prescription medicines only as told by your health care provider.  Do not take baths, swim, or use a hot tub until your health care provider approves. Ask your health care provider if you may take showers. You may only be allowed to take sponge baths.  Do not drive for 24 hours if you were given a sedative during your procedure.  Wear a medical alert bracelet in case of an emergency. This will tell any health care providers that you have a port.  Keep all follow-up visits as told by your health care provider. This is important.  May use EMLA cream as directed starting 2 weeks post port insertion.  After completion of intended therapy, make sure port is flushed every 4 to 6 weeks (if not being used otherwise). Contact a health care provider if:  You cannot flush your port with saline as directed, or you cannot draw blood from the port.  You have a fever or chills.  You have redness, swelling, or pain around your port insertion site.  You have fluid or blood coming from your port insertion site.  Your port insertion site feels warm to the touch.  You have pus or a bad smell coming from the port insertion site. Get help right away if:  You have chest pain or shortness of breath.  You have bleeding from your port that you cannot control. Summary  Take care of the port as told by your health care provider. Keep the manufacturer's information card with you at all times.  Change your dressing as  told by your health care provider.  Contact a health care provider if you have a fever or chills or if you have redness, swelling, or pain around your port insertion site.  Keep all follow-up visits as told by your health care provider. This information is not intended to replace advice given to you by your health care provider. Make sure you discuss any questions you have with your health care provider. Document Released: 10/22/2012 Document Revised: 07/30/2017 Document Reviewed: 07/30/2017 Elsevier Interactive Patient Education  [employer].

## 2018-02-10 NOTE — Procedures (Signed)
Pre Procedure Dx: Metastatic neuroendocrine tumor Post Procedural Dx: Same  Successful placement of right IJ approach port-a-cath with tip at the superior caval atrial junction. The catheter is ready for immediate use.  Estimated Blood Loss: Minimal  Complications: None immediate.  Ronny Bacon, MD Pager #: [phone]

## 2018-02-10 NOTE — Consult Note (Signed)
Chief Complaint: Patient was seen in consultation today for Port-A-Cath placement  Referring Physician(s): Ennever,Peter R  Supervising Physician: Sandi Mariscal  Patient Status: [facility] - Out-pt  History of Present Illness: [name] is a 73 y.o. male with history of metastatic prostate carcinoma as well as metastatic neuroendocrine carcinoma of unknown primary who presents today for Port-A-Cath placement for chemotherapy.  Past Medical History:  Diagnosis Date  . Complication of anesthesia   . Constipation    due to pain medication  . 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 Dyspnea    with exertion  . Essential hypertension 07/12/2014  . Goals of care, counseling/discussion 01/29/2018  . High blood sugar   . Neuroendocrine carcinoma of unknown origin ([place]) 12/14/2016  . PONV (postoperative nausea and vomiting)   . Prostate cancer ([place]) 07/23/2011   with mets to sacral, lungs  . Radiation 04/22/14-05/06/14   right sacrum 30 gray  . 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 CIRCUMCISION     when patient was 73 years old  . COLONOSCOPY    . COLONOSCOPY W/ POLYPECTOMY    . ESOPHAGOGASTRODUODENOSCOPY    . HAND SURGERY Left    fracture  . LUMBAR LAMINECTOMY/DECOMPRESSION MICRODISCECTOMY Right 09/12/2016   Procedure: LAMINECTOMY AND FORAMINOTOMY RIGHT LUMBAR FIVE- SACRAL ONE, SACRAL ONE- SACRAL TWO DECOMPRESSION OF RIGHT SACRAL ONE NERVE ROOT;  Surgeon: [name], MD;  Location: [facility];  Service: Neurosurgery;  Laterality: Right;  . ROBOT ASSISTED LAPAROSCOPIC RADICAL PROSTATECTOMY    . TONSILECTOMY/ADENOIDECTOMY WITH MYRINGOTOMY      Allergies: Codeine and Hydrocodone  Medications: Prior to Admission medications   Medication Sig Start Date End Date Taking? Authorizing Provider  aspirin 81 MG tablet Take 81 mg by mouth daily.   Yes [provider]  CALCIUM-IRON-VIT D-VIT K PO Take 1 tablet by mouth 2 (two) times daily.   Yes [provider]  gabapentin (NEURONTIN) 300 MG capsule TAKE 1 CAPSULE BY MOUTH TWICE A DAY THEN TAKE 2 CAPSULES AT BEDTIME Patient taking differently: Take 2 capsule by mouth in the morning and 2 capsules at night. 09/27/17  Yes [place], Holli Humbles, NP  glimepiride (AMARYL) 2 MG tablet TAKE 1 TABLET (2 MG TOTAL) BY MOUTH DAILY WITH BREAKFAST. 01/24/18  Yes [name], MD  OVER THE COUNTER MEDICATION every morning. Juice Plus -- 8 capsule of each the garden, vineyard, and orchead   Yes [provider]  UNABLE TO FIND Med Name: Juice Plus Omega Blend   Yes [provider]  [place] Name: Juice Plus [employer], Garden Blend, Orchard Blend   Yes [provider]  ZETIA 10 MG tablet Take 10 mg by mouth daily.  06/29/14  Yes [provider]  acetaminophen (TYLENOL) 500 MG tablet Take 1,000 mg by mouth every 4 (four) hours as needed for moderate pain.    [provider]  ondansetron (ZOFRAN) 8 MG tablet Take 1 tablet (8 mg total) by mouth 2 (two) times daily as needed for nausea or vomiting. 05/15/17   Gus Height, MD     Family History  Problem Relation Age of Onset  . Prostate cancer Father   . Heart disease Father   . Heart disease Mother     Social History   Socioeconomic History  . Marital status: Married    Spouse name: Not on file  .  Number of children: 2  . Years of education: Not on file  . Highest education level: Not on file  Occupational History  . Occupation: real estate aprais.    Employer: [employer]  Social Needs  . Financial resource strain: Not on file  . Food insecurity:    Worry: Not on file    Inability: Not on file  . Transportation needs:    Medical: Not on file    Non-medical: Not on file  Tobacco Use  . Smoking status: Never Smoker  . Smokeless tobacco: Never Used  . Tobacco comment: never used  tobacco  Substance and Sexual Activity  . Alcohol use: No    Alcohol/week: 0.0 standard drinks  . Drug use: No  . Sexual activity: Not on file  Lifestyle  . Physical activity:    Days per week: Not on file    Minutes per session: Not on file  . Stress: Not on file  Relationships  . Social connections:    Talks on phone: Not on file    Gets together: Not on file    Attends religious service: Not on file    Active member of club or organization: Not on file    Attends meetings of clubs or organizations: Not on file    Relationship status: Not on file  Other Topics Concern  . Not on file  Social History Narrative  . Not on file      Review of Systems he denies fever, headache, chest pain, dyspnea, cough, abdominal/back pain, nausea, vomiting or bleeding  Vital Signs: Blood pressure 139/85, temp 98, heart rate 81, respirations 13, O2 sat 100% room air   Physical Exam awake, alert.  Chest clear to auscultation bilaterally.  Heart with regular rate and rhythm.  Abdomen soft, positive bowel sounds, nontender.  No lower extremity edema.  Imaging: Nm Pet (netspot Ga 68 Dotatate) Skull Base To Mid Thigh  Result Date: 01/22/2018 CLINICAL DATA:  Neuroendocrine tumor with pulmonary metastasis. Prostate primary? Status post peptide receptor radiotherapy with completion 07/10/2017. EXAM: NUCLEAR MEDICINE PET SKULL BASE TO THIGH TECHNIQUE: 3.86 mCi Ga 68 DOTATATE was injected intravenously. Full-ring PET imaging was performed from the skull base to thigh after the radiotracer. CT data was obtained and used for attenuation correction and anatomic localization. COMPARISON:  DOTATATE PET scan 11/29/2017, 08/07/2017 FINDINGS: NECK No radiotracer activity in neck lymph nodes. Incidental CT findings: None CHEST Again demonstrated bilateral pulmonary nodules which are mildly increased in size from [place] PET scan 11/29/2017. For example triangular lesion in the lingula measuring 17 mm x 18 mm (image  83/4) compared to 17 mm x 14 mm. Lesion in the azygoesophageal recess of the RIGHT lung base measuring 19 mm compares to 16 mm (image 91/4). More superior RIGHT lobe nodule measuring 16 mm increased from 14 mm. Many of the nodules are not changed in size. There are approximately 25 nodules per lung ranging size from 5-20 mm. The radiotracer activity of the pulmonary metastasis is slightly increased. For example lingular lesion with SUV max equal 4.1 compared to 2.8. RIGHT lower lobe lesion with SUV max equal 6.1 compared to 4.5. No enlarged or radiotracer avid mediastinal lymph nodes. No clear and new pulmonary nodules are present. Incidental CT finding:None ABDOMEN/PELVIS No evidence of liver metastasis. Evidence of nodal metastasis in the abdomen pelvis. Post prostatectomy. No focal activity in the prostate bed. Physiologic activity noted in the liver, spleen, adrenal glands and kidneys. Incidental CT findings:None SKELETON A  persistent activity in the RIGHT sacral lesion with SUV max equal 7 point 1 compared to SUV max equal 4.2. Incidental CT findings:None IMPRESSION: 1. Mild progression of pulmonary metastasis with interval increase in size of several of the lesions and mild increase in radiotracer activity. No clear new lesions are present. Tumor burden is moderate with approximately 25 lesions per lung. 2. No significant increase in activity within the RIGHT sacral lesion. 3. No activity the prostate bed. 4. No new lesions identified on skull base to thigh scan. Electronically Signed   By: Suzy Bouchard M.D.   On: 01/22/2018 09:45    Labs:  CBC: Recent Labs    09/27/17 1021 11/15/17 0937 12/09/17 1340 01/24/18 1022  WBC 6.6 6.8 5.3 5.6  HGB 11.0* 10.9* 11.1* 11.5*  HCT 34.7* 35.2* 35.5* 36.2*  PLT 243 303 300 271    COAGS: No results for input(s): INR, APTT in the last 8760 hours.  BMP: Recent Labs    03/20/17 0900  07/10/17 0830  09/27/17 1021 11/15/17 0937 12/09/17 1340  01/24/18 1022  NA 139   < > 139   < > 139 144 142 140  K 3.7   < > 3.8   < > 3.5 3.8 4.6 4.1  CL 103   < > 101   < > 109* 102 106 103  CO2 27   < > 27   < > 27 27 31 30   GLUCOSE 86   < > 130*   < > 138* 92 108 104*  BUN 16   < > 22   < > 22 23* 23* 19  CALCIUM 9.4   < > 9.3   < > 9.1 10.4* 10.7* 9.8  CREATININE 1.18   < > 1.34*   < > 1.50* 1.40* 1.40* 1.20  GFRNONAA >60  --  52*  --   --   --   --  >60  GFRAA >60  --  >60  --   --   --   --  >60   < > = values in this interval not displayed.    LIVER FUNCTION TESTS: Recent Labs    09/27/17 1021 11/15/17 0937 12/09/17 1340 01/24/18 1022  BILITOT 0.5 0.5 0.5 0.4  AST 26 27 31 17   ALT 26 24 20 11   ALKPHOS 60 63 72 54  PROT 7.1 7.3 7.2 7.2  ALBUMIN 3.5 3.6 3.6 4.3    TUMOR MARKERS: Recent Labs    09/27/17 1021 11/15/17 0937 12/09/17 1340 01/24/18 1022  CHROMGRNA 2 2 2  104.0*    Assessment and Plan: 73 y.o. male with history of metastatic prostate carcinoma as well as metastatic neuroendocrine carcinoma of unknown primary who presents today for Port-A-Cath placement for chemotherapy.Risks and benefits of image guided port-a-catheter placement was discussed with the patient/spouse including, but not limited to bleeding, infection, pneumothorax, or fibrin sheath development and need for additional procedures.  All of the patient's questions were answered, patient is agreeable to proceed. Consent signed and in chart.  LABS PENDING   Thank you for this interesting consult.  I greatly enjoyed meeting MUTASIM TUCKEY and look forward to participating in their care.  A copy of this report was sent to the requesting provider on this date.  Electronically Signed: D. Rowe Robert, PA-C 02/10/2018, 2:10 PM   I spent a total of 25 minutes in face to face in clinical consultation, greater than 50% of which was counseling/coordinating care for Port-A-Cath placement

## 2018-02-12 ENCOUNTER — Other Ambulatory Visit: Payer: Self-pay | Admitting: *Deleted

## 2018-02-12 ENCOUNTER — Inpatient Hospital Stay: Payer: Medicare Other

## 2018-02-12 ENCOUNTER — Inpatient Hospital Stay (HOSPITAL_BASED_OUTPATIENT_CLINIC_OR_DEPARTMENT_OTHER): Payer: Medicare Other | Admitting: Hematology & Oncology

## 2018-02-12 VITALS — BP 127/68 | HR 87 | Temp 98.8°F | Resp 16 | Wt 151.0 lb

## 2018-02-12 DIAGNOSIS — R531 Weakness: Secondary | ICD-10-CM

## 2018-02-12 DIAGNOSIS — C7800 Secondary malignant neoplasm of unspecified lung: Secondary | ICD-10-CM | POA: Diagnosis not present

## 2018-02-12 DIAGNOSIS — C7951 Secondary malignant neoplasm of bone: Secondary | ICD-10-CM

## 2018-02-12 DIAGNOSIS — Z5111 Encounter for antineoplastic chemotherapy: Secondary | ICD-10-CM | POA: Diagnosis not present

## 2018-02-12 DIAGNOSIS — E119 Type 2 diabetes mellitus without complications: Secondary | ICD-10-CM | POA: Diagnosis not present

## 2018-02-12 DIAGNOSIS — Z7982 Long term (current) use of aspirin: Secondary | ICD-10-CM

## 2018-02-12 DIAGNOSIS — C61 Malignant neoplasm of prostate: Secondary | ICD-10-CM | POA: Diagnosis not present

## 2018-02-12 DIAGNOSIS — C7A8 Other malignant neuroendocrine tumors: Secondary | ICD-10-CM

## 2018-02-12 DIAGNOSIS — Z7984 Long term (current) use of oral hypoglycemic drugs: Secondary | ICD-10-CM | POA: Diagnosis not present

## 2018-02-12 DIAGNOSIS — C3490 Malignant neoplasm of unspecified part of unspecified bronchus or lung: Secondary | ICD-10-CM | POA: Diagnosis not present

## 2018-02-12 DIAGNOSIS — Z79899 Other long term (current) drug therapy: Secondary | ICD-10-CM | POA: Diagnosis not present

## 2018-02-12 DIAGNOSIS — Z5189 Encounter for other specified aftercare: Secondary | ICD-10-CM | POA: Diagnosis not present

## 2018-02-12 LAB — CMP (CANCER CENTER ONLY)
ALT: 9 U/L (ref 0–44)
AST: 16 U/L (ref 15–41)
Albumin: 4.3 g/dL (ref 3.5–5.0)
Alkaline Phosphatase: 52 U/L (ref 38–126)
Anion gap: 8 (ref 5–15)
BUN: 22 mg/dL (ref 8–23)
CO2: 29 mmol/L (ref 22–32)
Calcium: 10.2 mg/dL (ref 8.9–10.3)
Chloride: 103 mmol/L (ref 98–111)
Creatinine: 1.18 mg/dL (ref 0.61–1.24)
GFR, Est AFR Am: >60 mL/min (ref >60)
GFR, Estimated: >60 mL/min (ref >60)
Glucose, Bld: 56 mg/dL — ABNORMAL LOW (ref 70–99)
Potassium: 4 mmol/L (ref 3.5–5.1)
Sodium: 140 mmol/L (ref 135–145)
Total Bilirubin: 0.3 mg/dL (ref 0.3–1.2)
Total Protein: 6.9 g/dL (ref 6.5–8.1)

## 2018-02-12 LAB — CBC WITH DIFFERENTIAL (CANCER CENTER ONLY)
Abs Immature Granulocytes: 0.01 10*3/uL (ref 0.00–0.07)
Basophils Absolute: 0 10*3/uL (ref 0.0–0.1)
Basophils Relative: 1 %
Eosinophils Absolute: 0.2 10*3/uL (ref 0.0–0.5)
Eosinophils Relative: 3 %
HCT: 33.9 % — ABNORMAL LOW (ref 39.0–52.0)
Hemoglobin: 11.1 g/dL — ABNORMAL LOW (ref 13.0–17.0)
Immature Granulocytes: 0 %
Lymphocytes Relative: 22 %
Lymphs Abs: 1.2 10*3/uL (ref 0.7–4.0)
MCH: 31 pg (ref 26.0–34.0)
MCHC: 32.7 g/dL (ref 30.0–36.0)
MCV: 94.7 fL (ref 80.0–100.0)
Monocytes Absolute: 0.7 10*3/uL (ref 0.1–1.0)
Monocytes Relative: 12 %
Neutro Abs: 3.5 10*3/uL (ref 1.7–7.7)
Neutrophils Relative %: 62 %
Platelet Count: 250 10*3/uL (ref 150–400)
RBC: 3.58 MIL/uL — ABNORMAL LOW (ref 4.22–5.81)
RDW: 13.1 % (ref 11.5–15.5)
WBC Count: 5.6 10*3/uL (ref 4.0–10.5)
nRBC: 0 % (ref 0.0–0.2)

## 2018-02-12 LAB — LACTATE DEHYDROGENASE: LDH: 204 U/L — ABNORMAL HIGH (ref 98–192)

## 2018-02-12 MED ORDER — SODIUM CHLORIDE 0.9 % IV SOLN
1200.0000 mg | Freq: Once | INTRAVENOUS | Status: AC
  Administered 2018-02-12: 1200 mg via INTRAVENOUS
  Filled 2018-02-12: qty 20

## 2018-02-12 MED ORDER — SODIUM CHLORIDE 0.9 % IV SOLN
368.1000 mg | Freq: Once | INTRAVENOUS | Status: AC
  Administered 2018-02-12: 370 mg via INTRAVENOUS
  Filled 2018-02-12: qty 37

## 2018-02-12 MED ORDER — LORAZEPAM 0.5 MG PO TABS: 0.5000 mg | ORAL_TABLET | Freq: Four times a day (QID) | ORAL | 0 refills | Status: DC | PRN

## 2018-02-12 MED ORDER — HEPARIN SOD (PORK) LOCK FLUSH 100 UNIT/ML IV SOLN
500.0000 [IU] | Freq: Once | INTRAVENOUS | Status: AC | PRN
  Administered 2018-02-12: 500 [IU]
  Filled 2018-02-12: qty 5

## 2018-02-12 MED ORDER — SODIUM CHLORIDE 0.9 % IV SOLN
Freq: Once | INTRAVENOUS | Status: AC
  Administered 2018-02-12: 10:00:00 via INTRAVENOUS
  Filled 2018-02-12: qty 250

## 2018-02-12 MED ORDER — PALONOSETRON HCL INJECTION 0.25 MG/5ML
0.2500 mg | Freq: Once | INTRAVENOUS | Status: AC
  Administered 2018-02-12: 0.25 mg via INTRAVENOUS

## 2018-02-12 MED ORDER — ONDANSETRON HCL 8 MG PO TABS: 8.0000 mg | ORAL_TABLET | Freq: Two times a day (BID) | ORAL | 1 refills | Status: DC | PRN

## 2018-02-12 MED ORDER — DENOSUMAB 120 MG/1.7ML ~~LOC~~ SOLN
120.0000 mg | Freq: Once | SUBCUTANEOUS | Status: AC
  Administered 2018-02-12: 120 mg via SUBCUTANEOUS

## 2018-02-12 MED ORDER — LIDOCAINE-PRILOCAINE 2.5-2.5 % EX CREA: TOPICAL_CREAM | CUTANEOUS | 3 refills | Status: DC

## 2018-02-12 MED ORDER — PALONOSETRON HCL INJECTION 0.25 MG/5ML
INTRAVENOUS | Status: AC
  Filled 2018-02-12: qty 5

## 2018-02-12 MED ORDER — SODIUM CHLORIDE 0.9% FLUSH
10.0000 mL | INTRAVENOUS | Status: DC | PRN
  Administered 2018-02-12: 10 mL
  Filled 2018-02-12: qty 10

## 2018-02-12 MED ORDER — SODIUM CHLORIDE 0.9 % IV SOLN
80.0000 mg/m2 | Freq: Once | INTRAVENOUS | Status: AC
  Administered 2018-02-12: 140 mg via INTRAVENOUS
  Filled 2018-02-12: qty 7

## 2018-02-12 MED ORDER — LEUPROLIDE ACETATE (3 MONTH) 22.5 MG IM KIT
22.5000 mg | PACK | Freq: Once | INTRAMUSCULAR | Status: AC
  Administered 2018-02-12: 22.5 mg via INTRAMUSCULAR
  Filled 2018-02-12: qty 22.5

## 2018-02-12 MED ORDER — DENOSUMAB 120 MG/1.7ML ~~LOC~~ SOLN
SUBCUTANEOUS | Status: AC
  Filled 2018-02-12: qty 1.7

## 2018-02-12 MED ORDER — PROCHLORPERAZINE MALEATE 10 MG PO TABS: 10.0000 mg | ORAL_TABLET | Freq: Four times a day (QID) | ORAL | 1 refills | Status: DC | PRN

## 2018-02-12 MED ORDER — SODIUM CHLORIDE 0.9 % IV SOLN
Freq: Once | INTRAVENOUS | Status: AC
  Administered 2018-02-12: 10:00:00 via INTRAVENOUS
  Filled 2018-02-12: qty 5

## 2018-02-12 MED FILL — ONDANSETRON HCL 8 MG TABLET: 8 | 15 days supply | Qty: 30 | Fill #0

## 2018-02-12 MED FILL — PROCHLORPERAZINE 10 MG TAB: 10 | 7 days supply | Qty: 30 | Fill #0

## 2018-02-12 MED FILL — LORazepam 0.5 MG TABS: 0.5 | 7 days supply | Qty: 30 | Fill #0

## 2018-02-12 MED FILL — GABAPENTIN 300 MG CAPSULE: 300 | 30 days supply | Qty: 120 | Fill #4

## 2018-02-12 NOTE — Patient Instructions (Signed)
[name] Discharge Instructions for Patients Receiving Chemotherapy  Today you received the following chemotherapy agents tecentriq, carpoplatin, etoposid.  To help prevent nausea and vomiting after your treatment, we encourage you to take your nausea medication as indicated by your MD.   If you develop nausea and vomiting that is not controlled by your nausea medication, call the clinic.   BELOW ARE SYMPTOMS THAT SHOULD BE REPORTED IMMEDIATELY:  *FEVER GREATER THAN 100.5 F  *CHILLS WITH OR WITHOUT FEVER  NAUSEA AND VOMITING THAT IS NOT CONTROLLED WITH YOUR NAUSEA MEDICATION  *UNUSUAL SHORTNESS OF BREATH  *UNUSUAL BRUISING OR BLEEDING  TENDERNESS IN MOUTH AND THROAT WITH OR WITHOUT PRESENCE OF ULCERS  *URINARY PROBLEMS  *BOWEL PROBLEMS  UNUSUAL RASH Items with * indicate a potential emergency and should be followed up as soon as possible.  Feel free to call the clinic should you have any questions or concerns. The clinic phone number is [phone].  Please show the [place] at check-in to the Emergency Department and triage nurse.

## 2018-02-12 NOTE — Patient Instructions (Signed)

## 2018-02-12 NOTE — Progress Notes (Signed)
Hematology and Oncology Follow Up Visit  [name] [id number] [birth year] 73 y.o. 02/12/2018   Principle Diagnosis:  Metastatic small cell carcinoma --unknown primary  Metastatic Prostate cancer -- castrate sensitive   Current Therapy:    Zytiga 1000 mg by mouth daily - discontinued on 08/15/2016  Xtandi 160 mg po q day - start on 08/15/2016 - discontinued  Xgeva 120 mg subcutaneous Q3 months - due in Jan 2020  Lupron 22 mg IM every 3 months - due in Jan 2020  Radium-223 therapy -- s/p cycle #6  Palliative radiation to the right sacrum  Lutathera - s/p cycle 4/4 --last dose on 07/11/2017  Carbo/VP-16/Tecentriq -- cycle #1 on 02/12/2018     Interim History:  [name] is back for followup.  [name] has decided that he wants systemic chemotherapy.  He and his wife talked quite a bit about this.  Clearly, this unknown primary for his small cell lung cancer is the problem.  As such, I feel that we need to treat this as a extensive stage small cell lung cancer.  We will go ahead and start him on treatment with carboplatinum/etoposide/Tecentriq.  I am adjusting his doses a little bit so that he will hopefully have good tolerability.  He feels okay.  His right leg is still a little weak but getting better.  His blood sugars have not been on the high side.  He and his wife are very diligent in controlling his blood sugars.  He has had no problems with cough.  He has had no fever.  There has been no headache.  He has had no problems with blurred vision.  There has been no problems with bowels or bladder.  His PSA has always been less than 0.1.  Overall, his performance status is ECOG 1.     Medications:  Current Outpatient Medications:  .  acetaminophen (TYLENOL) 500 MG tablet, Take 1,000 mg by mouth every 4 (four) hours as needed for moderate pain., Disp: , Rfl:  .  aspirin 81 MG tablet, Take 81 mg by mouth daily., Disp: , Rfl:  .  CALCIUM-IRON-VIT D-VIT K PO,  Take 1 tablet by mouth 2 (two) times daily., Disp: , Rfl:  .  gabapentin (NEURONTIN) 300 MG capsule, TAKE 1 CAPSULE BY MOUTH TWICE A DAY THEN TAKE 2 CAPSULES AT BEDTIME (Patient taking differently: Take 2 capsule by mouth in the morning and 2 capsules at night.), Disp: 120 capsule, Rfl: 4 .  glimepiride (AMARYL) 2 MG tablet, TAKE 1 TABLET (2 MG TOTAL) BY MOUTH DAILY WITH BREAKFAST., Disp: 30 tablet, Rfl: 4 .  ondansetron (ZOFRAN) 8 MG tablet, Take 1 tablet (8 mg total) by mouth 2 (two) times daily as needed for nausea or vomiting., Disp: 20 tablet, Rfl: 0 .  OVER THE COUNTER MEDICATION, every morning. Juice Plus -- 8 capsule of each the garden, vineyard, and orchead, Disp: , Rfl:  .  UNABLE TO FIND, Med Name: Juice Plus Omega Blend, Disp: , Rfl:  .  UNABLE TO FIND, Med Name: Juice Plus [employer], Garden Blend, [employer], Disp: , Rfl:  .  ZETIA 10 MG tablet, Take 10 mg by mouth daily. , Disp: , Rfl:  No current facility-administered medications for this visit.   Facility-Administered Medications Ordered in Other Visits:  .  octreotide (SANDOSTATIN LAR) IM injection 30 mg, 30 mg, Intramuscular, Once, Syla Devoss, Rudell Cobb, MD  Allergies:  Allergies  Allergen Reactions  . Codeine Nausea And Vomiting  .  Hydrocodone Nausea And Vomiting    Past Medical History, Surgical history, Social history, and Family History were reviewed and updated.  Review of Systems: Review of Systems  Constitutional: Negative.   HENT: Negative.   Eyes: Negative.   Respiratory: Negative.   Cardiovascular: Negative.   Gastrointestinal: Negative.   Genitourinary: Negative.  Negative for urgency.  Musculoskeletal: Positive for joint pain.  Skin: Negative.   Neurological: Positive for focal weakness.  Endo/Heme/Allergies: Negative.   Psychiatric/Behavioral: Negative.   All other systems reviewed and are negative.    Physical Exam:  weight is 151 lb (68.5 kg). His oral temperature is 98.8 F (37.1 C). His  blood pressure is 127/68 and his pulse is 87. His respiration is 16 and oxygen saturation is 100%.   Physical Exam Vitals signs reviewed.  HENT:     Head: Normocephalic and atraumatic.  Eyes:     Pupils: Pupils are equal, round, and reactive to light.  Neck:     Musculoskeletal: Normal range of motion.  Cardiovascular:     Rate and Rhythm: Normal rate and regular rhythm.     Heart sounds: Normal heart sounds.  Pulmonary:     Effort: Pulmonary effort is normal.     Breath sounds: Normal breath sounds.  Abdominal:     General: Bowel sounds are normal.     Palpations: Abdomen is soft.  Musculoskeletal: Normal range of motion.        General: No tenderness or deformity.  Lymphadenopathy:     Cervical: No cervical adenopathy.  Skin:    General: Skin is warm and dry.     Findings: No erythema or rash.  Neurological:     Mental Status: He is alert and oriented to person, place, and time.  Psychiatric:        Behavior: Behavior normal.        Thought Content: Thought content normal.        Judgment: Judgment normal.      Lab Results  Component Value Date   WBC 5.6 02/12/2018   HGB 11.1 (L) 02/12/2018   HCT 33.9 (L) 02/12/2018   MCV 94.7 02/12/2018   PLT 250 02/12/2018     Chemistry      Component Value Date/Time   NA 140 02/12/2018 0851   NA 144 12/14/2016 1159   NA 140 10/22/2016 1129   K 4.0 02/12/2018 0851   K 3.5 12/14/2016 1159   K 4.3 10/22/2016 1129   CL 103 02/12/2018 0851   CL 103 12/14/2016 1159   CO2 29 02/12/2018 0851   CO2 30 12/14/2016 1159   CO2 27 10/22/2016 1129   BUN 22 02/12/2018 0851   BUN 16 12/14/2016 1159   BUN 14.0 10/22/2016 1129   CREATININE 1.18 02/12/2018 0851   CREATININE 1.3 (H) 12/14/2016 1159   CREATININE 1.1 10/22/2016 1129      Component Value Date/Time   CALCIUM 10.2 02/12/2018 0851   CALCIUM 10.0 12/14/2016 1159   CALCIUM 10.4 10/22/2016 1129   ALKPHOS 52 02/12/2018 0851   ALKPHOS 48 12/14/2016 1159   ALKPHOS 60  10/22/2016 1129   AST 16 02/12/2018 0851   AST 16 10/22/2016 1129   ALT 9 02/12/2018 0851   ALT 23 12/14/2016 1159   ALT 14 10/22/2016 1129   BILITOT 0.3 02/12/2018 0851   BILITOT 0.31 10/22/2016 1129         Impression and Plan: [name] is 73 year old gentleman with metastatic neuro endocrine carcinoma of unknown  known primary.  He also has metastatic prostate cancer.  He is castrate resistant.  This, however really is not the problem.  I think we have this under very good control.  By his last PET scan, this neuroendocrine carcinoma is an issue.  As such, we are going to treat this.  We will start his treatment today.  I will give him 2 cycles of chemotherapy/immunotherapy and then we will re-scan him and see how everything looks.  Hopefully, we will find that he is responding nicely.  I will have him come back in 3 weeks.  That will be cycle #2 of treatment.  We still are managing his quality of life as our top priority.  Hopefully, treatment is not going to interfere with this. [name], MD 1/29/20209:27 AM

## 2018-02-13 ENCOUNTER — Inpatient Hospital Stay: Payer: Medicare Other

## 2018-02-13 ENCOUNTER — Other Ambulatory Visit: Payer: Self-pay | Admitting: Hematology & Oncology

## 2018-02-13 VITALS — BP 125/71 | HR 102 | Temp 98.1°F | Resp 20

## 2018-02-13 DIAGNOSIS — C7951 Secondary malignant neoplasm of bone: Secondary | ICD-10-CM

## 2018-02-13 DIAGNOSIS — C7800 Secondary malignant neoplasm of unspecified lung: Secondary | ICD-10-CM | POA: Diagnosis not present

## 2018-02-13 DIAGNOSIS — C61 Malignant neoplasm of prostate: Secondary | ICD-10-CM | POA: Diagnosis not present

## 2018-02-13 DIAGNOSIS — C7A8 Other malignant neuroendocrine tumors: Secondary | ICD-10-CM

## 2018-02-13 DIAGNOSIS — C3490 Malignant neoplasm of unspecified part of unspecified bronchus or lung: Secondary | ICD-10-CM | POA: Diagnosis not present

## 2018-02-13 DIAGNOSIS — Z5111 Encounter for antineoplastic chemotherapy: Secondary | ICD-10-CM | POA: Diagnosis not present

## 2018-02-13 DIAGNOSIS — Z5189 Encounter for other specified aftercare: Secondary | ICD-10-CM | POA: Diagnosis not present

## 2018-02-13 LAB — TSH: TSH: 4.917 u[IU]/mL — ABNORMAL HIGH (ref 0.320–4.118)

## 2018-02-13 MED ORDER — SODIUM CHLORIDE 0.9 % IV SOLN
Freq: Once | INTRAVENOUS | Status: AC
  Administered 2018-02-13: 09:00:00 via INTRAVENOUS
  Filled 2018-02-13: qty 250

## 2018-02-13 MED ORDER — SODIUM CHLORIDE 0.9% FLUSH
10.0000 mL | INTRAVENOUS | Status: DC | PRN
  Administered 2018-02-13: 10 mL
  Filled 2018-02-13: qty 10

## 2018-02-13 MED ORDER — DEXAMETHASONE SODIUM PHOSPHATE 10 MG/ML IJ SOLN
INTRAMUSCULAR | Status: AC
  Filled 2018-02-13: qty 1

## 2018-02-13 MED ORDER — SODIUM CHLORIDE 0.9 % IV SOLN
80.0000 mg/m2 | Freq: Once | INTRAVENOUS | Status: AC
  Administered 2018-02-13: 140 mg via INTRAVENOUS
  Filled 2018-02-13: qty 7

## 2018-02-13 MED ORDER — DEXAMETHASONE SODIUM PHOSPHATE 10 MG/ML IJ SOLN
10.0000 mg | Freq: Once | INTRAMUSCULAR | Status: AC
  Administered 2018-02-13: 10 mg via INTRAVENOUS

## 2018-02-13 MED ORDER — HEPARIN SOD (PORK) LOCK FLUSH 100 UNIT/ML IV SOLN
500.0000 [IU] | Freq: Once | INTRAVENOUS | Status: AC | PRN
  Administered 2018-02-13: 500 [IU]
  Filled 2018-02-13: qty 5

## 2018-02-13 MED FILL — LIDOCAINE-PRILOCAINE CREAM: 2.5-2.5 | 30 days supply | Qty: 30 | Fill #0

## 2018-02-13 NOTE — Progress Notes (Signed)
Okay to treat with HR = 102 per Dr. Marin Olp.

## 2018-02-14 ENCOUNTER — Inpatient Hospital Stay: Payer: Medicare Other

## 2018-02-14 VITALS — BP 130/71 | HR 85 | Temp 97.8°F | Resp 18

## 2018-02-14 DIAGNOSIS — C61 Malignant neoplasm of prostate: Secondary | ICD-10-CM | POA: Diagnosis not present

## 2018-02-14 DIAGNOSIS — C7951 Secondary malignant neoplasm of bone: Secondary | ICD-10-CM

## 2018-02-14 DIAGNOSIS — Z5189 Encounter for other specified aftercare: Secondary | ICD-10-CM | POA: Diagnosis not present

## 2018-02-14 DIAGNOSIS — Z5111 Encounter for antineoplastic chemotherapy: Secondary | ICD-10-CM | POA: Diagnosis not present

## 2018-02-14 DIAGNOSIS — C3490 Malignant neoplasm of unspecified part of unspecified bronchus or lung: Secondary | ICD-10-CM | POA: Diagnosis not present

## 2018-02-14 DIAGNOSIS — C7A8 Other malignant neuroendocrine tumors: Secondary | ICD-10-CM

## 2018-02-14 DIAGNOSIS — C7800 Secondary malignant neoplasm of unspecified lung: Secondary | ICD-10-CM | POA: Diagnosis not present

## 2018-02-14 MED ORDER — PEGFILGRASTIM 6 MG/0.6ML ~~LOC~~ PSKT
PREFILLED_SYRINGE | SUBCUTANEOUS | Status: AC
  Filled 2018-02-14: qty 0.6

## 2018-02-14 MED ORDER — HEPARIN SOD (PORK) LOCK FLUSH 100 UNIT/ML IV SOLN
500.0000 [IU] | Freq: Once | INTRAVENOUS | Status: AC | PRN
  Administered 2018-02-14: 500 [IU]
  Filled 2018-02-14: qty 5

## 2018-02-14 MED ORDER — SODIUM CHLORIDE 0.9 % IV SOLN
Freq: Once | INTRAVENOUS | Status: AC
  Administered 2018-02-14: 13:00:00 via INTRAVENOUS
  Filled 2018-02-14: qty 250

## 2018-02-14 MED ORDER — SODIUM CHLORIDE 0.9% FLUSH
10.0000 mL | INTRAVENOUS | Status: DC | PRN
  Administered 2018-02-14: 10 mL
  Filled 2018-02-14: qty 10

## 2018-02-14 MED ORDER — SODIUM CHLORIDE 0.9 % IV SOLN
80.0000 mg/m2 | Freq: Once | INTRAVENOUS | Status: AC
  Administered 2018-02-14: 140 mg via INTRAVENOUS
  Filled 2018-02-14: qty 7

## 2018-02-14 MED ORDER — PEGFILGRASTIM 6 MG/0.6ML ~~LOC~~ PSKT
6.0000 mg | PREFILLED_SYRINGE | Freq: Once | SUBCUTANEOUS | Status: AC
  Administered 2018-02-14: 6 mg via SUBCUTANEOUS

## 2018-02-14 MED ORDER — DEXAMETHASONE SODIUM PHOSPHATE 10 MG/ML IJ SOLN
10.0000 mg | Freq: Once | INTRAMUSCULAR | Status: AC
  Administered 2018-02-14: 10 mg via INTRAVENOUS

## 2018-02-14 MED ORDER — DEXAMETHASONE SODIUM PHOSPHATE 10 MG/ML IJ SOLN
INTRAMUSCULAR | Status: AC
  Filled 2018-02-14: qty 1

## 2018-02-14 NOTE — Progress Notes (Signed)
Pt. came to clinic today for his treatment. Stated that he woke up this morning "flushed" and with a "queasy stomach". After taking compazine he felt better, but was still flushed. Dr. Marin Olp was informed and told him that this from the decadron and it will resolve in a couplle of days. Pt. denied any vomiting, diarrhea or constipation, no urinary problems nor other problems. He knows how to take his home PRN medication and advised to seek medical care if he experiences any serious side effects. He was feeling fine upon discharge.

## 2018-02-14 NOTE — Patient Instructions (Signed)
Etoposide, VP-16 capsules What is this medicine? ETOPOSIDE, VP-16 (e toe POE side) is a chemotherapy drug. It is used to treat small cell lung cancer and other cancers. This medicine may be used for other purposes; ask your health care provider or pharmacist if you have questions. COMMON BRAND NAME(S): VePesid What should I tell my health care provider before I take this medicine? They need to know if you have any of these conditions: -infection -kidney disease -liver disease -low blood counts, like low white cell, platelet, or red cell counts -an unusual or allergic reaction to etoposide, other medicines, foods, dyes, or preservatives -pregnant or trying to get pregnant -breast-feeding How should I use this medicine? Take this medicine by mouth with a glass of water. Follow the directions on the prescription label. Do not open, crush, or chew the capsules. It is advisable to wear gloves when handling this medicine. Take your medicine at regular intervals. Do not take it more often than directed. Do not stop taking except on your doctor's advice. Talk to your pediatrician regarding the use of this medicine in children. Special care may be needed. Overdosage: If you think you have taken too much of this medicine contact a poison control center or emergency room at once. NOTE: This medicine is only for you. Do not share this medicine with others. What if I miss a dose? If you miss a dose, take it as soon as you can. If it is almost time for your next dose, take only that dose. Do not take double or extra doses. What may interact with this medicine? -aspirin -certain medications for seizures like carbamazepine, phenobarbital, phenytoin, valproic acid -cyclosporine -levamisole -valproic acid -warfarin This list may not describe all possible interactions. Give your health care provider a list of all the medicines, herbs, non-prescription drugs, or dietary supplements you use. Also tell them if  you smoke, drink alcohol, or use illegal drugs. Some items may interact with your medicine. What should I watch for while using this medicine? Visit your doctor for checks on your progress. This drug may make you feel generally unwell. This is not uncommon, as chemotherapy can affect healthy cells as well as cancer cells. Report any side effects. Continue your course of treatment even though you feel ill unless your doctor tells you to stop. In some cases, you may be given additional medicines to help with side effects. Follow all directions for their use. Call your doctor or health care professional for advice if you get a fever, chills or sore throat, or other symptoms of a cold or flu. Do not treat yourself. This drug decreases your body's ability to fight infections. Try to avoid being around people who are sick. This medicine may increase your risk to bruise or bleed. Call your doctor or health care professional if you notice any unusual bleeding. Talk to your doctor about your risk of cancer. You may be more at risk for certain types of cancers if you take this medicine. Do not become pregnant while taking this medicine or for at least 6 months after stopping it. Women should inform their doctor if they wish to become pregnant or think they might be pregnant. Women of child-bearing potential will need to have a negative pregnancy test before starting this medicine. There is a potential for serious side effects to an unborn child. Talk to your health care professional or pharmacist for more information. Do not breast-feed an infant while taking this medicine. Men must use a   latex condom during sexual contact with a woman while taking this medicine and for at least 4 months after stopping it. A latex condom is needed even if you have had a vasectomy. Contact your doctor right away if your partner becomes pregnant. Do not donate sperm while taking this medicine and for 4 months after you stop taking this  medicine. Men should inform their doctors if they wish to father a child. This medicine may lower sperm counts. What side effects may I notice from receiving this medicine? Side effects that you should report to your doctor or health care professional as soon as possible: -allergic reactions like skin rash, itching or hives, swelling of the face, lips, or tongue -low blood counts - this medicine may decrease the number of white blood cells, red blood cells and platelets. You may be at increased risk for infections and bleeding. -signs of infection - fever or chills, cough, sore throat, pain or difficulty passing urine -signs of decreased platelets or bleeding - bruising, pinpoint red spots on the skin, black, tarry stools, blood in the urine -signs of decreased red blood cells - unusually weak or tired, fainting spells, lightheadedness -breathing problems -changes in vision -mouth or throat sores or ulcers -pain, tingling, numbness in the hands or feet -redness, blistering, peeling or loosening of the skin, including inside the mouth -seizures -vomiting Side effects that usually do not require medical attention (report to your doctor or health care professional if they continue or are bothersome): -change in taste -diarrhea -hair loss -nausea -stomach pain This list may not describe all possible side effects. Call your doctor for medical advice about side effects. You may report side effects to FDA at 1-800-FDA-1088. Where should I keep my medicine? Keep out of the reach of children. Store in a refrigerator between 2 and 8 degrees C (36 and 46 degrees F). Do not freeze. Throw away any unused medicine after the expiration date. NOTE: This sheet is a summary. It may not cover all possible information. If you have questions about this medicine, talk to your doctor, pharmacist, or health care provider.  2019 [employer]vier/Gold Standard (2014-12-24 11:49:52)

## 2018-02-17 ENCOUNTER — Inpatient Hospital Stay: Payer: Medicare Other

## 2018-02-24 ENCOUNTER — Ambulatory Visit (HOSPITAL_COMMUNITY): Payer: Medicare Other

## 2018-02-27 MED FILL — EZETIMIBE 10 MG TABS: 10 | 90 days supply | Qty: 90 | Fill #0

## 2018-02-27 MED FILL — GLIMEPIRIDE 2 MG TABLET: 2 | 30 days supply | Qty: 30 | Fill #1

## 2018-03-05 ENCOUNTER — Encounter: Payer: Self-pay | Admitting: Hematology & Oncology

## 2018-03-05 ENCOUNTER — Inpatient Hospital Stay: Payer: Medicare Other

## 2018-03-05 ENCOUNTER — Other Ambulatory Visit: Payer: Medicare Other

## 2018-03-05 ENCOUNTER — Telehealth: Payer: Self-pay | Admitting: Hematology & Oncology

## 2018-03-05 ENCOUNTER — Other Ambulatory Visit: Payer: Self-pay

## 2018-03-05 ENCOUNTER — Ambulatory Visit: Payer: Medicare Other | Admitting: Hematology & Oncology

## 2018-03-05 ENCOUNTER — Inpatient Hospital Stay: Payer: Medicare Other | Attending: Hematology & Oncology | Admitting: Hematology & Oncology

## 2018-03-05 ENCOUNTER — Ambulatory Visit: Payer: Medicare Other

## 2018-03-05 VITALS — BP 128/69 | HR 83 | Temp 97.6°F | Resp 18 | Wt 150.0 lb

## 2018-03-05 DIAGNOSIS — Z5111 Encounter for antineoplastic chemotherapy: Secondary | ICD-10-CM | POA: Diagnosis not present

## 2018-03-05 DIAGNOSIS — C7951 Secondary malignant neoplasm of bone: Secondary | ICD-10-CM

## 2018-03-05 DIAGNOSIS — Z7984 Long term (current) use of oral hypoglycemic drugs: Secondary | ICD-10-CM | POA: Diagnosis not present

## 2018-03-05 DIAGNOSIS — Z5189 Encounter for other specified aftercare: Secondary | ICD-10-CM | POA: Diagnosis not present

## 2018-03-05 DIAGNOSIS — Z5112 Encounter for antineoplastic immunotherapy: Secondary | ICD-10-CM | POA: Diagnosis not present

## 2018-03-05 DIAGNOSIS — C61 Malignant neoplasm of prostate: Secondary | ICD-10-CM | POA: Insufficient documentation

## 2018-03-05 DIAGNOSIS — Z79899 Other long term (current) drug therapy: Secondary | ICD-10-CM | POA: Insufficient documentation

## 2018-03-05 DIAGNOSIS — Z7982 Long term (current) use of aspirin: Secondary | ICD-10-CM | POA: Insufficient documentation

## 2018-03-05 DIAGNOSIS — E119 Type 2 diabetes mellitus without complications: Secondary | ICD-10-CM | POA: Insufficient documentation

## 2018-03-05 DIAGNOSIS — C7A8 Other malignant neuroendocrine tumors: Secondary | ICD-10-CM

## 2018-03-05 LAB — CBC WITH DIFFERENTIAL/PLATELET
Abs Immature Granulocytes: 0.07 10*3/uL (ref 0.00–0.07)
Basophils Absolute: 0.1 10*3/uL (ref 0.0–0.1)
Basophils Relative: 1 %
Eosinophils Absolute: 0 10*3/uL (ref 0.0–0.5)
Eosinophils Relative: 1 %
HCT: 31.1 % — ABNORMAL LOW (ref 39.0–52.0)
Hemoglobin: 10.2 g/dL — ABNORMAL LOW (ref 13.0–17.0)
Immature Granulocytes: 1 %
Lymphocytes Relative: 18 %
Lymphs Abs: 1 10*3/uL (ref 0.7–4.0)
MCH: 31.2 pg (ref 26.0–34.0)
MCHC: 32.8 g/dL (ref 30.0–36.0)
MCV: 95.1 fL (ref 80.0–100.0)
Monocytes Absolute: 0.8 10*3/uL (ref 0.1–1.0)
Monocytes Relative: 15 %
Neutro Abs: 3.5 10*3/uL (ref 1.7–7.7)
Neutrophils Relative %: 64 %
Platelets: 311 10*3/uL (ref 150–400)
RBC: 3.27 MIL/uL — ABNORMAL LOW (ref 4.22–5.81)
RDW: 14.1 % (ref 11.5–15.5)
WBC: 5.4 10*3/uL (ref 4.0–10.5)
nRBC: 0 % (ref 0.0–0.2)

## 2018-03-05 LAB — COMPREHENSIVE METABOLIC PANEL
ALT: 21 U/L (ref 0–44)
AST: 15 U/L (ref 15–41)
Albumin: 4.1 g/dL (ref 3.5–5.0)
Alkaline Phosphatase: 75 U/L (ref 38–126)
Anion gap: 9 (ref 5–15)
BUN: 17 mg/dL (ref 8–23)
CO2: 28 mmol/L (ref 22–32)
Calcium: 9.9 mg/dL (ref 8.9–10.3)
Chloride: 102 mmol/L (ref 98–111)
Creatinine, Ser: 1.25 mg/dL — ABNORMAL HIGH (ref 0.61–1.24)
GFR calc Af Amer: >60 mL/min (ref >60)
GFR calc non Af Amer: 58 mL/min — ABNORMAL LOW (ref >60)
Glucose, Bld: 101 mg/dL — ABNORMAL HIGH (ref 70–99)
Potassium: 3.8 mmol/L (ref 3.5–5.1)
Sodium: 139 mmol/L (ref 135–145)
Total Bilirubin: 0.2 mg/dL — ABNORMAL LOW (ref 0.3–1.2)
Total Protein: 6.6 g/dL (ref 6.5–8.1)

## 2018-03-05 LAB — TSH: TSH: 5.303 u[IU]/mL — ABNORMAL HIGH (ref 0.320–4.118)

## 2018-03-05 LAB — LACTATE DEHYDROGENASE: LDH: 192 U/L (ref 98–192)

## 2018-03-05 MED ORDER — SODIUM CHLORIDE 0.9 % IV SOLN
80.0000 mg/m2 | Freq: Once | INTRAVENOUS | Status: AC
  Administered 2018-03-05: 140 mg via INTRAVENOUS
  Filled 2018-03-05: qty 7

## 2018-03-05 MED ORDER — SODIUM CHLORIDE 0.9 % IV SOLN
1200.0000 mg | Freq: Once | INTRAVENOUS | Status: AC
  Administered 2018-03-05: 1200 mg via INTRAVENOUS
  Filled 2018-03-05: qty 20

## 2018-03-05 MED ORDER — PALONOSETRON HCL INJECTION 0.25 MG/5ML
0.2500 mg | Freq: Once | INTRAVENOUS | Status: AC
  Administered 2018-03-05: 0.25 mg via INTRAVENOUS

## 2018-03-05 MED ORDER — SODIUM CHLORIDE 0.9% FLUSH
10.0000 mL | INTRAVENOUS | Status: DC | PRN
  Administered 2018-03-05: 10 mL
  Filled 2018-03-05: qty 10

## 2018-03-05 MED ORDER — SODIUM CHLORIDE 0.9 % IV SOLN
Freq: Once | INTRAVENOUS | Status: AC
  Administered 2018-03-05: 09:00:00 via INTRAVENOUS
  Filled 2018-03-05: qty 5

## 2018-03-05 MED ORDER — PALONOSETRON HCL INJECTION 0.25 MG/5ML
INTRAVENOUS | Status: AC
  Filled 2018-03-05: qty 5

## 2018-03-05 MED ORDER — SODIUM CHLORIDE 0.9 % IV SOLN
Freq: Once | INTRAVENOUS | Status: AC
  Administered 2018-03-05: 09:00:00 via INTRAVENOUS
  Filled 2018-03-05: qty 250

## 2018-03-05 MED ORDER — HEPARIN SOD (PORK) LOCK FLUSH 100 UNIT/ML IV SOLN
500.0000 [IU] | Freq: Once | INTRAVENOUS | Status: AC | PRN
  Administered 2018-03-05: 500 [IU]
  Filled 2018-03-05: qty 5

## 2018-03-05 MED ORDER — SODIUM CHLORIDE 0.9 % IV SOLN
393.0000 mg | Freq: Once | INTRAVENOUS | Status: AC
  Administered 2018-03-05: 390 mg via INTRAVENOUS
  Filled 2018-03-05: qty 39

## 2018-03-05 NOTE — Telephone Encounter (Signed)
IB message sent to Dr E regarding F/U appt after PET on 3/9

## 2018-03-05 NOTE — Progress Notes (Signed)
Hematology and Oncology Follow Up Visit  [name] [id number] [birth year] 73 y.o. 03/05/2018   Principle Diagnosis:  Metastatic small cell carcinoma --unknown primary  Metastatic Prostate cancer -- castrate sensitive   Current Therapy:    Zytiga 1000 mg by mouth daily - discontinued on 08/15/2016  Xtandi 160 mg po q day - start on 08/15/2016 - discontinued  Xgeva 120 mg subcutaneous Q3 months - due in Jan 2020  Lupron 22 mg IM every 3 months - due in Jan 2020  Radium-223 therapy -- s/p cycle #6  Palliative radiation to the right sacrum  Lutathera - s/p cycle 4/4 --last dose on 07/11/2017  Carbo/VP-16/Tecentriq --  S/p cycle #1      Interim History:  [name] is back for followup.  He is doing quite well.  He did lose his hair 5 days ago.  This was little bit emotional for him.  Otherwise, he is done incredibly well with his first cycle of treatment.  Hopefully, it will work.  After this second cycle, I will repeat his PET scan.  He has had no mouth sores.  He has had no diarrhea.  There has been no bleeding.  He has had no leg swelling.  He has had no cough or shortness of breath.  There is been no nausea or vomiting.  His appetite has been quite good.  Overall, his performance status is ECOG 1.     Medications:  Current Outpatient Medications:  .  acetaminophen (TYLENOL) 500 MG tablet, Take 1,000 mg by mouth every 4 (four) hours as needed for moderate pain., Disp: , Rfl:  .  aspirin 81 MG tablet, Take 81 mg by mouth daily., Disp: , Rfl:  .  CALCIUM-IRON-VIT D-VIT K PO, Take 1 tablet by mouth 2 (two) times daily., Disp: , Rfl:  .  gabapentin (NEURONTIN) 300 MG capsule, TAKE 1 CAPSULE BY MOUTH TWICE A DAY THEN TAKE 2 CAPSULES AT BEDTIME (Patient taking differently: Take 2 capsule by mouth in the morning and 2 capsules at night.), Disp: 120 capsule, Rfl: 4 .  glimepiride (AMARYL) 2 MG tablet, TAKE 1 TABLET (2 MG TOTAL) BY MOUTH DAILY WITH BREAKFAST., Disp: 30  tablet, Rfl: 4 .  lidocaine-prilocaine (EMLA) cream, Apply to affected area once, Disp: 30 g, Rfl: 3 .  LORazepam (ATIVAN) 0.5 MG tablet, Take 1 tablet (0.5 mg total) by mouth every 6 (six) hours as needed (Nausea or vomiting)., Disp: 30 tablet, Rfl: 0 .  ondansetron (ZOFRAN) 8 MG tablet, Take 1 tablet (8 mg total) by mouth 2 (two) times daily as needed for nausea or vomiting., Disp: 20 tablet, Rfl: 0 .  ondansetron (ZOFRAN) 8 MG tablet, Take 1 tablet (8 mg total) by mouth 2 (two) times daily as needed for refractory nausea / vomiting. Start on day 3 after carboplatin chemo., Disp: 30 tablet, Rfl: 1 .  OVER THE COUNTER MEDICATION, every morning. Juice Plus -- 8 capsule of each the garden, vineyard, and orchead, Disp: , Rfl:  .  prochlorperazine (COMPAZINE) 10 MG tablet, Take 1 tablet (10 mg total) by mouth every 6 (six) hours as needed (Nausea or vomiting)., Disp: 30 tablet, Rfl: 1 .  UNABLE TO FIND, Med Name: Juice Plus Omega Blend, Disp: , Rfl:  .  UNABLE TO FIND, Med Name: Juice Plus [employer], Garden Blend, [employer], Disp: , Rfl:  .  ZETIA 10 MG tablet, Take 10 mg by mouth daily. , Disp: , Rfl:  No current facility-administered  medications for this visit.   Facility-Administered Medications Ordered in Other Visits:  .  octreotide (SANDOSTATIN LAR) IM injection 30 mg, 30 mg, Intramuscular, Once, Cheree Fowles, Rudell Cobb, MD  Allergies:  Allergies  Allergen Reactions  . Codeine Nausea And Vomiting  . Hydrocodone Nausea And Vomiting    Past Medical History, Surgical history, Social history, and Family History were reviewed and updated.  Review of Systems: Review of Systems  Constitutional: Negative.   HENT: Negative.   Eyes: Negative.   Respiratory: Negative.   Cardiovascular: Negative.   Gastrointestinal: Negative.   Genitourinary: Negative.  Negative for urgency.  Musculoskeletal: Positive for joint pain.  Skin: Negative.   Neurological: Positive for focal weakness.    Endo/Heme/Allergies: Negative.   Psychiatric/Behavioral: Negative.   All other systems reviewed and are negative.    Physical Exam:  weight is 150 lb (68 kg). His oral temperature is 97.6 F (36.4 C). His blood pressure is 128/69 and his pulse is 83. His respiration is 18 and oxygen saturation is 100%.   Physical Exam Vitals signs reviewed.  HENT:     Head: Normocephalic and atraumatic.  Eyes:     Pupils: Pupils are equal, round, and reactive to light.  Neck:     Musculoskeletal: Normal range of motion.  Cardiovascular:     Rate and Rhythm: Normal rate and regular rhythm.     Heart sounds: Normal heart sounds.  Pulmonary:     Effort: Pulmonary effort is normal.     Breath sounds: Normal breath sounds.  Abdominal:     General: Bowel sounds are normal.     Palpations: Abdomen is soft.  Musculoskeletal: Normal range of motion.        General: No tenderness or deformity.  Lymphadenopathy:     Cervical: No cervical adenopathy.  Skin:    General: Skin is warm and dry.     Findings: No erythema or rash.  Neurological:     Mental Status: He is alert and oriented to person, place, and time.  Psychiatric:        Behavior: Behavior normal.        Thought Content: Thought content normal.        Judgment: Judgment normal.      Lab Results  Component Value Date   WBC 5.4 03/05/2018   HGB 10.2 (L) 03/05/2018   HCT 31.1 (L) 03/05/2018   MCV 95.1 03/05/2018   PLT 311 03/05/2018     Chemistry      Component Value Date/Time   NA 140 02/12/2018 0851   NA 144 12/14/2016 1159   NA 140 10/22/2016 1129   K 4.0 02/12/2018 0851   K 3.5 12/14/2016 1159   K 4.3 10/22/2016 1129   CL 103 02/12/2018 0851   CL 103 12/14/2016 1159   CO2 29 02/12/2018 0851   CO2 30 12/14/2016 1159   CO2 27 10/22/2016 1129   BUN 22 02/12/2018 0851   BUN 16 12/14/2016 1159   BUN 14.0 10/22/2016 1129   CREATININE 1.18 02/12/2018 0851   CREATININE 1.3 (H) 12/14/2016 1159   CREATININE 1.1 10/22/2016  1129      Component Value Date/Time   CALCIUM 10.2 02/12/2018 0851   CALCIUM 10.0 12/14/2016 1159   CALCIUM 10.4 10/22/2016 1129   ALKPHOS 52 02/12/2018 0851   ALKPHOS 48 12/14/2016 1159   ALKPHOS 60 10/22/2016 1129   AST 16 02/12/2018 0851   AST 16 10/22/2016 1129   ALT 9 02/12/2018 0851  ALT 23 12/14/2016 1159   ALT 14 10/22/2016 1129   BILITOT 0.3 02/12/2018 0851   BILITOT 0.31 10/22/2016 1129         Impression and Plan: Mr. 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  He also has metastatic prostate cancer.  He is castrate resistant.  This, however really is not the problem.  I think we have this under very good control.  We will go ahead with his second cycle of treatment.  We will then proceed with a PET scan.  I think this will be incredibly beneficial for [place].  Hopefully, we will see that he is responding.  If he is, then we will continue him on this protocol.  I will plan to see him back in 3 weeks.  We will have the PET scan done in about 2 weeks.   Marland Kitchen    [name], MD 2/19/20208:20 AM

## 2018-03-05 NOTE — Patient Instructions (Addendum)
Atezolizumab injection What is this medicine? ATEZOLIZUMAB (a te zoe LIZ ue mab) is a monoclonal antibody. It is used to treat bladder cancer (urothelial cancer), non-small cell lung cancer, small cell lung cancer, and breast cancer. This medicine may be used for other purposes; ask your health care provider or pharmacist if you have questions. COMMON BRAND NAME(S): Tecentriq What should I tell my health care provider before I take this medicine? They need to know if you have any of these conditions: -diabetes -immune system problems -infection -inflammatory bowel disease -liver disease -lung or breathing disease -lupus -nervous system problems like myasthenia gravis or Guillain-Barre syndrome -organ transplant -an unusual or allergic reaction to atezolizumab, other medicines, foods, dyes, or preservatives -pregnant or trying to get pregnant -breast-feeding How should I use this medicine? This medicine is for infusion into a vein. It is given by a health care professional in a hospital or clinic setting. A special MedGuide will be given to you before each treatment. Be sure to read this information carefully each time. Talk to your pediatrician regarding the use of this medicine in children. Special care may be needed. Overdosage: If you think you have taken too much of this medicine contact a poison control center or emergency room at once. NOTE: This medicine is only for you. Do not share this medicine with others. What if I miss a dose? It is important not to miss your dose. Call your doctor or health care professional if you are unable to keep an appointment. What may interact with this medicine? Interactions have not been studied. This list may not describe all possible interactions. Give your health care provider a list of all the medicines, herbs, non-prescription drugs, or dietary supplements you use. Also tell them if you smoke, drink alcohol, or use illegal drugs. Some items  may interact with your medicine. What should I watch for while using this medicine? Your condition will be monitored carefully while you are receiving this medicine. You may need blood work done while you are taking this medicine. Do not become pregnant while taking this medicine or for at least 5 months after stopping it. Women should inform their doctor if they wish to become pregnant or think they might be pregnant. There is a potential for serious side effects to an unborn child. Talk to your health care professional or pharmacist for more information. Do not breast-feed an infant while taking this medicine or for at least 5 months after the last dose. What side effects may I notice from receiving this medicine? Side effects that you should report to your doctor or health care professional as soon as possible: -allergic reactions like skin rash, itching or hives, swelling of the face, lips, or tongue -black, tarry stools -bloody or watery diarrhea -breathing problems -changes in vision -chest pain or chest tightness -chills -facial flushing -fever -headache -signs and symptoms of high blood sugar such as dizziness; dry mouth; dry skin; fruity breath; nausea; stomach pain; increased hunger or thirst; increased urination -signs and symptoms of liver injury like dark yellow or brown urine; general ill feeling or flu-like symptoms; light-colored stools; loss of appetite; nausea; right upper belly pain; unusually weak or tired; yellowing of the eyes or skin -stomach pain -trouble passing urine or change in the amount of urine Side effects that usually do not require medical attention (report to your doctor or health care professional if they continue or are bothersome): -cough -diarrhea -joint pain -muscle pain -muscle weakness -tiredness -weight loss   This list may not describe all possible side effects. Call your doctor for medical advice about side effects. You may report side effects  to FDA at 1-800-FDA-1088. Where should I keep my medicine? This drug is given in a hospital or clinic and will not be stored at home. NOTE: This sheet is a summary. It may not cover all possible information. If you have questions about this medicine, talk to your doctor, pharmacist, or health care provider.  2019 Elsevier/Gold Standard (2017-04-05 09:33:38) carboEtoposide, VP-16 capsules What is this medicine? ETOPOSIDE, VP-16 (e toe POE side) is a chemotherapy drug. It is used to treat small cell lung cancer and other cancers. This medicine may be used for other purposes; ask your health care provider or pharmacist if you have questions. COMMON BRAND NAME(S): VePesid What should I tell my health care provider before I take this medicine? They need to know if you have any of these conditions: -infection -kidney disease -liver disease -low blood counts, like low white cell, platelet, or red cell counts -an unusual or allergic reaction to etoposide, other medicines, foods, dyes, or preservatives -pregnant or trying to get pregnant -breast-feeding How should I use this medicine? Take this medicine by mouth with a glass of water. Follow the directions on the prescription label. Do not open, crush, or chew the capsules. It is advisable to wear gloves when handling this medicine. Take your medicine at regular intervals. Do not take it more often than directed. Do not stop taking except on your doctor's advice. Talk to your pediatrician regarding the use of this medicine in children. Special care may be needed. Overdosage: If you think you have taken too much of this medicine contact a poison control center or emergency room at once. NOTE: This medicine is only for you. Do not share this medicine with others. What if I miss a dose? If you miss a dose, take it as soon as you can. If it is almost time for your next dose, take only that dose. Do not take double or extra doses. What may interact with  this medicine? -aspirin -certain medications for seizures like carbamazepine, phenobarbital, phenytoin, valproic acid -cyclosporine -levamisole -valproic acid -warfarin This list may not describe all possible interactions. Give your health care provider a list of all the medicines, herbs, non-prescription drugs, or dietary supplements you use. Also tell them if you smoke, drink alcohol, or use illegal drugs. Some items may interact with your medicine. What should I watch for while using this medicine? Visit your doctor for checks on your progress. This drug may make you feel generally unwell. This is not uncommon, as chemotherapy can affect healthy cells as well as cancer cells. Report any side effects. Continue your course of treatment even though you feel ill unless your doctor tells you to stop. In some cases, you may be given additional medicines to help with side effects. Follow all directions for their use. Call your doctor or health care professional for advice if you get a fever, chills or sore throat, or other symptoms of a cold or flu. Do not treat yourself. This drug decreases your body's ability to fight infections. Try to avoid being around people who are sick. This medicine may increase your risk to bruise or bleed. Call your doctor or health care professional if you notice any unusual bleeding. Talk to your doctor about your risk of cancer. You may be more at risk for certain types of cancers if you take this medicine. Do  not become pregnant while taking this medicine or for at least 6 months after stopping it. Women should inform their doctor if they wish to become pregnant or think they might be pregnant. Women of child-bearing potential will need to have a negative pregnancy test before starting this medicine. There is a potential for serious side effects to an unborn child. Talk to your health care professional or pharmacist for more information. Do not breast-feed an infant while  taking this medicine. Men must use a latex condom during sexual contact with a woman while taking this medicine and for at least 4 months after stopping it. A latex condom is needed even if you have had a vasectomy. Contact your doctor right away if your partner becomes pregnant. Do not donate sperm while taking this medicine and for 4 months after you stop taking this medicine. Men should inform their doctors if they wish to father a child. This medicine may lower sperm counts. What side effects may I notice from receiving this medicine? Side effects that you should report to your doctor or health care professional as soon as possible: -allergic reactions like skin rash, itching or hives, swelling of the face, lips, or tongue -low blood counts - this medicine may decrease the number of white blood cells, red blood cells and platelets. You may be at increased risk for infections and bleeding. -signs of infection - fever or chills, cough, sore throat, pain or difficulty passing urine -signs of decreased platelets or bleeding - bruising, pinpoint red spots on the skin, black, tarry stools, blood in the urine -signs of decreased red blood cells - unusually weak or tired, fainting spells, lightheadedness -breathing problems -changes in vision -mouth or throat sores or ulcers -pain, tingling, numbness in the hands or feet -redness, blistering, peeling or loosening of the skin, including inside the mouth -seizures -vomiting Side effects that usually do not require medical attention (report to your doctor or health care professional if they continue or are bothersome): -change in taste -diarrhea -hair loss -nausea -stomach pain This list may not describe all possible side effects. Call your doctor for medical advice about side effects. You may report side effects to FDA at 1-800-FDA-1088. Where should I keep my medicine? Keep out of the reach of children. Store in a refrigerator between 2 and 8  degrees C (36 and 46 degrees F). Do not freeze. Throw away any unused medicine after the expiration date. NOTE: This sheet is a summary. It may not cover all possible information. If you have questions about this medicine, talk to your doctor, pharmacist, or health care provider.  2019 Elsevier/Gold Standard (2014-12-24 11:49:52) Carboplatin injection What is this medicine? CARBOPLATIN (KAR boe pla tin) is a chemotherapy drug. It targets fast dividing cells, like cancer cells, and causes these cells to die. This medicine is used to treat ovarian cancer and many other cancers. This medicine may be used for other purposes; ask your health care provider or pharmacist if you have questions. COMMON BRAND NAME(S): Paraplatin What should I tell my health care provider before I take this medicine? They need to know if you have any of these conditions: -blood disorders -hearing problems -kidney disease -recent or ongoing radiation therapy -an unusual or allergic reaction to carboplatin, cisplatin, other chemotherapy, other medicines, foods, dyes, or preservatives -pregnant or trying to get pregnant -breast-feeding How should I use this medicine? This drug is usually given as an infusion into a vein. It is administered in a hospital or  clinic by a specially trained health care professional. Talk to your pediatrician regarding the use of this medicine in children. Special care may be needed. Overdosage: If you think you have taken too much of this medicine contact a poison control center or emergency room at once. NOTE: This medicine is only for you. Do not share this medicine with others. What if I miss a dose? It is important not to miss a dose. Call your doctor or health care professional if you are unable to keep an appointment. What may interact with this medicine? -medicines for seizures -medicines to increase blood counts like filgrastim, pegfilgrastim, sargramostim -some antibiotics like  amikacin, gentamicin, neomycin, streptomycin, tobramycin -vaccines Talk to your doctor or health care professional before taking any of these medicines: -acetaminophen -aspirin -ibuprofen -ketoprofen -naproxen This list may not describe all possible interactions. Give your health care provider a list of all the medicines, herbs, non-prescription drugs, or dietary supplements you use. Also tell them if you smoke, drink alcohol, or use illegal drugs. Some items may interact with your medicine. What should I watch for while using this medicine? Your condition will be monitored carefully while you are receiving this medicine. You will need important blood work done while you are taking this medicine. This drug may make you feel generally unwell. This is not uncommon, as chemotherapy can affect healthy cells as well as cancer cells. Report any side effects. Continue your course of treatment even though you feel ill unless your doctor tells you to stop. In some cases, you may be given additional medicines to help with side effects. Follow all directions for their use. Call your doctor or health care professional for advice if you get a fever, chills or sore throat, or other symptoms of a cold or flu. Do not treat yourself. This drug decreases your body's ability to fight infections. Try to avoid being around people who are sick. This medicine may increase your risk to bruise or bleed. Call your doctor or health care professional if you notice any unusual bleeding. Be careful brushing and flossing your teeth or using a toothpick because you may get an infection or bleed more easily. If you have any dental work done, tell your dentist you are receiving this medicine. Avoid taking products that contain aspirin, acetaminophen, ibuprofen, naproxen, or ketoprofen unless instructed by your doctor. These medicines may hide a fever. Do not become pregnant while taking this medicine. Women should inform their  doctor if they wish to become pregnant or think they might be pregnant. There is a potential for serious side effects to an unborn child. Talk to your health care professional or pharmacist for more information. Do not breast-feed an infant while taking this medicine. What side effects may I notice from receiving this medicine? Side effects that you should report to your doctor or health care professional as soon as possible: -allergic reactions like skin rash, itching or hives, swelling of the face, lips, or tongue -signs of infection - fever or chills, cough, sore throat, pain or difficulty passing urine -signs of decreased platelets or bleeding - bruising, pinpoint red spots on the skin, black, tarry stools, nosebleeds -signs of decreased red blood cells - unusually weak or tired, fainting spells, lightheadedness -breathing problems -changes in hearing -changes in vision -chest pain -high blood pressure -low blood counts - This drug may decrease the number of white blood cells, red blood cells and platelets. You may be at increased risk for infections and bleeding. -nausea  and vomiting -pain, swelling, redness or irritation at the injection site -pain, tingling, numbness in the hands or feet -problems with balance, talking, walking -trouble passing urine or change in the amount of urine Side effects that usually do not require medical attention (report to your doctor or health care professional if they continue or are bothersome): -hair loss -loss of appetite -metallic taste in the mouth or changes in taste This list may not describe all possible side effects. Call your doctor for medical advice about side effects. You may report side effects to FDA at 1-800-FDA-1088. Where should I keep my medicine? This drug is given in a hospital or clinic and will not be stored at home. NOTE: This sheet is a summary. It may not cover all possible information. If you have questions about this medicine,  talk to your doctor, pharmacist, or health care provider.  2019 Elsevier/Gold Standard (2007-04-08 14:38:05)

## 2018-03-06 ENCOUNTER — Inpatient Hospital Stay: Payer: Medicare Other

## 2018-03-06 ENCOUNTER — Ambulatory Visit: Payer: Medicare Other

## 2018-03-06 VITALS — BP 131/70 | HR 104 | Temp 97.8°F | Resp 18

## 2018-03-06 DIAGNOSIS — Z7984 Long term (current) use of oral hypoglycemic drugs: Secondary | ICD-10-CM | POA: Diagnosis not present

## 2018-03-06 DIAGNOSIS — C7A8 Other malignant neuroendocrine tumors: Secondary | ICD-10-CM | POA: Diagnosis not present

## 2018-03-06 DIAGNOSIS — Z5189 Encounter for other specified aftercare: Secondary | ICD-10-CM | POA: Diagnosis not present

## 2018-03-06 DIAGNOSIS — E119 Type 2 diabetes mellitus without complications: Secondary | ICD-10-CM | POA: Diagnosis not present

## 2018-03-06 DIAGNOSIS — C61 Malignant neoplasm of prostate: Secondary | ICD-10-CM | POA: Diagnosis not present

## 2018-03-06 DIAGNOSIS — C7951 Secondary malignant neoplasm of bone: Secondary | ICD-10-CM

## 2018-03-06 DIAGNOSIS — Z5111 Encounter for antineoplastic chemotherapy: Secondary | ICD-10-CM | POA: Diagnosis not present

## 2018-03-06 LAB — T4: T4, Total: 6 ug/dL (ref 4.5–12.0)

## 2018-03-06 MED ORDER — HEPARIN SOD (PORK) LOCK FLUSH 100 UNIT/ML IV SOLN
500.0000 [IU] | Freq: Once | INTRAVENOUS | Status: AC | PRN
  Administered 2018-03-06: 500 [IU]
  Filled 2018-03-06: qty 5

## 2018-03-06 MED ORDER — SODIUM CHLORIDE 0.9 % IV SOLN
80.0000 mg/m2 | Freq: Once | INTRAVENOUS | Status: AC
  Administered 2018-03-06: 140 mg via INTRAVENOUS
  Filled 2018-03-06: qty 7

## 2018-03-06 MED ORDER — SODIUM CHLORIDE 0.9% FLUSH
10.0000 mL | INTRAVENOUS | Status: DC | PRN
  Administered 2018-03-06: 10 mL
  Filled 2018-03-06: qty 10

## 2018-03-06 MED ORDER — DEXAMETHASONE SODIUM PHOSPHATE 10 MG/ML IJ SOLN
INTRAMUSCULAR | Status: AC
  Filled 2018-03-06: qty 1

## 2018-03-06 MED ORDER — SODIUM CHLORIDE 0.9 % IV SOLN
Freq: Once | INTRAVENOUS | Status: AC
  Administered 2018-03-06: 09:00:00 via INTRAVENOUS
  Filled 2018-03-06: qty 250

## 2018-03-06 MED ORDER — DEXAMETHASONE SODIUM PHOSPHATE 10 MG/ML IJ SOLN
10.0000 mg | Freq: Once | INTRAMUSCULAR | Status: AC
  Administered 2018-03-06: 10 mg via INTRAVENOUS

## 2018-03-06 NOTE — Patient Instructions (Signed)
Dexamethasone injection What is this medicine? DEXAMETHASONE (dex a METH a sone) is a corticosteroid. It is used to treat inflammation of the skin, joints, lungs, and other organs. Common conditions treated include asthma, allergies, and arthritis. It is also used for other conditions, like blood disorders and diseases of the adrenal glands. This medicine may be used for other purposes; ask your health care provider or pharmacist if you have questions. COMMON BRAND NAME(S): Decadron, DoubleDex, Simplist Dexamethasone, Solurex What should I tell my health care provider before I take this medicine? They need to know if you have any of these conditions: -blood clotting problems -Cushing's syndrome -diabetes -glaucoma -heart problems or disease -high blood pressure -infection like herpes, measles, tuberculosis, or chickenpox -kidney disease -liver disease -mental problems -myasthenia gravis -osteoporosis -previous heart attack -seizures -stomach, ulcer or intestine disease including colitis and diverticulitis -thyroid problem -an unusual or allergic reaction to dexamethasone, corticosteroids, other medicines, lactose, foods, dyes, or preservatives -pregnant or trying to get pregnant -breast-feeding How should I use this medicine? This medicine is for injection into a muscle, joint, lesion, soft tissue, or vein. It is given by a health care professional in a hospital or clinic setting. Talk to your pediatrician regarding the use of this medicine in children. Special care may be needed. Overdosage: If you think you have taken too much of this medicine contact a poison control center or emergency room at once. NOTE: This medicine is only for you. Do not share this medicine with others. What if I miss a dose? This may not apply. If you are having a series of injections over a prolonged period, try not to miss an appointment. Call your doctor or health care professional to reschedule if you  are unable to keep an appointment. What may interact with this medicine? Do not take this medicine with any of the following medications: -mifepristone, RU-486 -vaccines This medicine may also interact with the following medications: -amphotericin B -antibiotics like clarithromycin, erythromycin, and troleandomycin -aspirin and aspirin-like drugs -barbiturates like phenobarbital -carbamazepine -cholestyramine -cholinesterase inhibitors like donepezil, galantamine, rivastigmine, and tacrine -cyclosporine -digoxin -diuretics -ephedrine -male hormones, like estrogens or progestins and birth control pills -indinavir -isoniazid -ketoconazole -medicines for diabetes -medicines that improve muscle tone or strength for conditions like myasthenia gravis -NSAIDs, medicines for pain and inflammation, like ibuprofen or naproxen -phenytoin -rifampin -thalidomide -warfarin This list may not describe all possible interactions. Give your health care provider a list of all the medicines, herbs, non-prescription drugs, or dietary supplements you use. Also tell them if you smoke, drink alcohol, or use illegal drugs. Some items may interact with your medicine. What should I watch for while using this medicine? Your condition will be monitored carefully while you are receiving this medicine. If you are taking this medicine for a long time, carry an identification card with your name and address, the type and dose of your medicine, and your doctor's name and address. This medicine may increase your risk of getting an infection. Stay away from people who are sick. Tell your doctor or health care professional if you are around anyone with measles or chickenpox. Talk to your health care provider before you get any vaccines that you take this medicine. If you are going to have surgery, tell your doctor or health care professional that you have taken this medicine within the last twelve months. Ask your  doctor or health care professional about your diet. You may need to lower the amount of salt you eat. The  medicine can increase your blood sugar. If you are a diabetic check with your doctor if you need help adjusting the dose of your diabetic medicine. What side effects may I notice from receiving this medicine? Side effects that you should report to your doctor or health care professional as soon as possible: -allergic reactions like skin rash, itching or hives, swelling of the face, lips, or tongue -black or tarry stools -change in the amount of urine -changes in vision -confusion, excitement, restlessness, a false sense of well-being -fever, sore throat, sneezing, cough, or other signs of infection, wounds that will not heal -hallucinations -increased thirst -mental depression, mood swings, mistaken feelings of self importance or of being mistreated -pain in hips, back, ribs, arms, shoulders, or legs -pain, redness, or irritation at the injection site -redness, blistering, peeling or loosening of the skin, including inside the mouth -rounding out of face -swelling of feet or lower legs -unusual bleeding or bruising -unusual tired or weak -wounds that do not heal Side effects that usually do not require medical attention (report to your doctor or health care professional if they continue or are bothersome): -diarrhea or constipation -change in taste -headache -nausea, vomiting -skin problems, acne, thin and shiny skin -touble sleeping -unusual growth of hair on the face or body -weight gain This list may not describe all possible side effects. Call your doctor for medical advice about side effects. You may report side effects to FDA at 1-800-FDA-1088. Where should I keep my medicine? This drug is given in a hospital or clinic and will not be stored at home. NOTE: This sheet is a summary. It may not cover all possible information. If you have questions about this medicine, talk to  your doctor, pharmacist, or health care provider.  2019 [employer] (2007-04-24 14:04:12) Etoposide, VP-16 injection What is this medicine? ETOPOSIDE, VP-16 (e toe POE side) is a chemotherapy drug. It is used to treat testicular cancer, lung cancer, and other cancers. This medicine may be used for other purposes; ask your health care provider or pharmacist if you have questions. COMMON BRAND NAME(S): Etopophos, Toposar, VePesid What should I tell my health care provider before I take this medicine? They need to know if you have any of these conditions: -infection -kidney disease -liver disease -low blood counts, like low white cell, platelet, or red cell counts -an unusual or allergic reaction to etoposide, other medicines, foods, dyes, or preservatives -pregnant or trying to get pregnant -breast-feeding How should I use this medicine? This medicine is for infusion into a vein. It is administered in a hospital or clinic by a specially trained health care professional. Talk to your pediatrician regarding the use of this medicine in children. Special care may be needed. Overdosage: If you think you have taken too much of this medicine contact a poison control center or emergency room at once. NOTE: This medicine is only for you. Do not share this medicine with others. What if I miss a dose? It is important not to miss your dose. Call your doctor or health care professional if you are unable to keep an appointment. What may interact with this medicine? -aspirin -certain medications for seizures like carbamazepine, phenobarbital, phenytoin, valproic acid -cyclosporine -levamisole -warfarin This list may not describe all possible interactions. Give your health care provider a list of all the medicines, herbs, non-prescription drugs, or dietary supplements you use. Also tell them if you smoke, drink alcohol, or use illegal drugs. Some items may interact with your  medicine. What  should I watch for while using this medicine? Visit your doctor for checks on your progress. This drug may make you feel generally unwell. This is not uncommon, as chemotherapy can affect healthy cells as well as cancer cells. Report any side effects. Continue your course of treatment even though you feel ill unless your doctor tells you to stop. In some cases, you may be given additional medicines to help with side effects. Follow all directions for their use. Call your doctor or health care professional for advice if you get a fever, chills or sore throat, or other symptoms of a cold or flu. Do not treat yourself. This drug decreases your body's ability to fight infections. Try to avoid being around people who are sick. This medicine may increase your risk to bruise or bleed. Call your doctor or health care professional if you notice any unusual bleeding. Talk to your doctor about your risk of cancer. You may be more at risk for certain types of cancers if you take this medicine. Do not become pregnant while taking this medicine or for at least 6 months after stopping it. Women should inform their doctor if they wish to become pregnant or think they might be pregnant. Women of child-bearing potential will need to have a negative pregnancy test before starting this medicine. There is a potential for serious side effects to an unborn child. Talk to your health care professional or pharmacist for more information. Do not breast-feed an infant while taking this medicine. Men must use a latex condom during sexual contact with a woman while taking this medicine and for at least 4 months after stopping it. A latex condom is needed even if you have had a vasectomy. Contact your doctor right away if your partner becomes pregnant. Do not donate sperm while taking this medicine and for at least 4 months after you stop taking this medicine. Men should inform their doctors if they wish to father a child. This medicine  may lower sperm counts. What side effects may I notice from receiving this medicine? Side effects that you should report to your doctor or health care professional as soon as possible: -allergic reactions like skin rash, itching or hives, swelling of the face, lips, or tongue -low blood counts - this medicine may decrease the number of white blood cells, red blood cells and platelets. You may be at increased risk for infections and bleeding. -signs of infection - fever or chills, cough, sore throat, pain or difficulty passing urine -signs of decreased platelets or bleeding - bruising, pinpoint red spots on the skin, black, tarry stools, blood in the urine -signs of decreased red blood cells - unusually weak or tired, fainting spells, lightheadedness -breathing problems -changes in vision -mouth or throat sores or ulcers -pain, redness, swelling or irritation at the injection site -pain, tingling, numbness in the hands or feet -redness, blistering, peeling or loosening of the skin, including inside the mouth -seizures -vomiting Side effects that usually do not require medical attention (report to your doctor or health care professional if they continue or are bothersome): -diarrhea -hair loss -loss of appetite -nausea -stomach pain This list may not describe all possible side effects. Call your doctor for medical advice about side effects. You may report side effects to FDA at 1-800-FDA-1088. Where should I keep my medicine? This drug is given in a hospital or clinic and will not be stored at home. NOTE: This sheet is a summary. It may not  cover all possible information. If you have questions about this medicine, talk to your doctor, pharmacist, or health care provider.  2019 [employer] Standard (2014-12-24 11:53:23)

## 2018-03-07 ENCOUNTER — Ambulatory Visit: Payer: Medicare Other

## 2018-03-07 ENCOUNTER — Inpatient Hospital Stay: Payer: Medicare Other

## 2018-03-07 VITALS — BP 134/72 | HR 92 | Temp 97.8°F | Resp 16

## 2018-03-07 DIAGNOSIS — C61 Malignant neoplasm of prostate: Secondary | ICD-10-CM | POA: Diagnosis not present

## 2018-03-07 DIAGNOSIS — C7A8 Other malignant neuroendocrine tumors: Secondary | ICD-10-CM | POA: Diagnosis not present

## 2018-03-07 DIAGNOSIS — Z7984 Long term (current) use of oral hypoglycemic drugs: Secondary | ICD-10-CM | POA: Diagnosis not present

## 2018-03-07 DIAGNOSIS — Z5189 Encounter for other specified aftercare: Secondary | ICD-10-CM | POA: Diagnosis not present

## 2018-03-07 DIAGNOSIS — E119 Type 2 diabetes mellitus without complications: Secondary | ICD-10-CM | POA: Diagnosis not present

## 2018-03-07 DIAGNOSIS — Z5111 Encounter for antineoplastic chemotherapy: Secondary | ICD-10-CM | POA: Diagnosis not present

## 2018-03-07 DIAGNOSIS — C7951 Secondary malignant neoplasm of bone: Secondary | ICD-10-CM

## 2018-03-07 MED ORDER — HEPARIN SOD (PORK) LOCK FLUSH 100 UNIT/ML IV SOLN
500.0000 [IU] | Freq: Once | INTRAVENOUS | Status: AC | PRN
  Administered 2018-03-07: 500 [IU]
  Filled 2018-03-07: qty 5

## 2018-03-07 MED ORDER — SODIUM CHLORIDE 0.9% FLUSH
10.0000 mL | INTRAVENOUS | Status: DC | PRN
  Administered 2018-03-07: 10 mL
  Filled 2018-03-07: qty 10

## 2018-03-07 MED ORDER — PEGFILGRASTIM 6 MG/0.6ML ~~LOC~~ PSKT
PREFILLED_SYRINGE | SUBCUTANEOUS | Status: AC
  Filled 2018-03-07: qty 0.6

## 2018-03-07 MED ORDER — PEGFILGRASTIM 6 MG/0.6ML ~~LOC~~ PSKT
6.0000 mg | PREFILLED_SYRINGE | Freq: Once | SUBCUTANEOUS | Status: AC
  Administered 2018-03-07: 6 mg via SUBCUTANEOUS

## 2018-03-07 MED ORDER — DEXAMETHASONE SODIUM PHOSPHATE 10 MG/ML IJ SOLN
10.0000 mg | Freq: Once | INTRAMUSCULAR | Status: AC
  Administered 2018-03-07: 10 mg via INTRAVENOUS

## 2018-03-07 MED ORDER — DEXAMETHASONE SODIUM PHOSPHATE 10 MG/ML IJ SOLN
INTRAMUSCULAR | Status: AC
  Filled 2018-03-07: qty 1

## 2018-03-07 MED ORDER — SODIUM CHLORIDE 0.9 % IV SOLN
Freq: Once | INTRAVENOUS | Status: AC
  Administered 2018-03-07: 12:00:00 via INTRAVENOUS
  Filled 2018-03-07: qty 250

## 2018-03-07 MED ORDER — SODIUM CHLORIDE 0.9 % IV SOLN
80.0000 mg/m2 | Freq: Once | INTRAVENOUS | Status: AC
  Administered 2018-03-07: 140 mg via INTRAVENOUS
  Filled 2018-03-07: qty 7

## 2018-03-07 NOTE — Patient Instructions (Signed)
Etoposide, VP-16 injection What is this medicine? ETOPOSIDE, VP-16 (e toe POE side) is a chemotherapy drug. It is used to treat testicular cancer, lung cancer, and other cancers. This medicine may be used for other purposes; ask your health care provider or pharmacist if you have questions. COMMON BRAND NAME(S): Etopophos, Toposar, VePesid What should I tell my health care provider before I take this medicine? They need to know if you have any of these conditions: -infection -kidney disease -liver disease -low blood counts, like low white cell, platelet, or red cell counts -an unusual or allergic reaction to etoposide, other medicines, foods, dyes, or preservatives -pregnant or trying to get pregnant -breast-feeding How should I use this medicine? This medicine is for infusion into a vein. It is administered in a hospital or clinic by a specially trained health care professional. Talk to your pediatrician regarding the use of this medicine in children. Special care may be needed. Overdosage: If you think you have taken too much of this medicine contact a poison control center or emergency room at once. NOTE: This medicine is only for you. Do not share this medicine with others. What if I miss a dose? It is important not to miss your dose. Call your doctor or health care professional if you are unable to keep an appointment. What may interact with this medicine? -aspirin -certain medications for seizures like carbamazepine, phenobarbital, phenytoin, valproic acid -cyclosporine -levamisole -warfarin This list may not describe all possible interactions. Give your health care provider a list of all the medicines, herbs, non-prescription drugs, or dietary supplements you use. Also tell them if you smoke, drink alcohol, or use illegal drugs. Some items may interact with your medicine. What should I watch for while using this medicine? Visit your doctor for checks on your progress. This drug  may make you feel generally unwell. This is not uncommon, as chemotherapy can affect healthy cells as well as cancer cells. Report any side effects. Continue your course of treatment even though you feel ill unless your doctor tells you to stop. In some cases, you may be given additional medicines to help with side effects. Follow all directions for their use. Call your doctor or health care professional for advice if you get a fever, chills or sore throat, or other symptoms of a cold or flu. Do not treat yourself. This drug decreases your body's ability to fight infections. Try to avoid being around people who are sick. This medicine may increase your risk to bruise or bleed. Call your doctor or health care professional if you notice any unusual bleeding. Talk to your doctor about your risk of cancer. You may be more at risk for certain types of cancers if you take this medicine. Do not become pregnant while taking this medicine or for at least 6 months after stopping it. Women should inform their doctor if they wish to become pregnant or think they might be pregnant. Women of child-bearing potential will need to have a negative pregnancy test before starting this medicine. There is a potential for serious side effects to an unborn child. Talk to your health care professional or pharmacist for more information. Do not breast-feed an infant while taking this medicine. Men must use a latex condom during sexual contact with a woman while taking this medicine and for at least 4 months after stopping it. A latex condom is needed even if you have had a vasectomy. Contact your doctor right away if your partner becomes pregnant. Do   not donate sperm while taking this medicine and for at least 4 months after you stop taking this medicine. Men should inform their doctors if they wish to father a child. This medicine may lower sperm counts. What side effects may I notice from receiving this medicine? Side effects that  you should report to your doctor or health care professional as soon as possible: -allergic reactions like skin rash, itching or hives, swelling of the face, lips, or tongue -low blood counts - this medicine may decrease the number of white blood cells, red blood cells and platelets. You may be at increased risk for infections and bleeding. -signs of infection - fever or chills, cough, sore throat, pain or difficulty passing urine -signs of decreased platelets or bleeding - bruising, pinpoint red spots on the skin, black, tarry stools, blood in the urine -signs of decreased red blood cells - unusually weak or tired, fainting spells, lightheadedness -breathing problems -changes in vision -mouth or throat sores or ulcers -pain, redness, swelling or irritation at the injection site -pain, tingling, numbness in the hands or feet -redness, blistering, peeling or loosening of the skin, including inside the mouth -seizures -vomiting Side effects that usually do not require medical attention (report to your doctor or health care professional if they continue or are bothersome): -diarrhea -hair loss -loss of appetite -nausea -stomach pain This list may not describe all possible side effects. Call your doctor for medical advice about side effects. You may report side effects to FDA at 1-800-FDA-1088. Where should I keep my medicine? This drug is given in a hospital or clinic and will not be stored at home. NOTE: This sheet is a summary. It may not cover all possible information. If you have questions about this medicine, talk to your doctor, pharmacist, or health care provider.  2019 [employer] (2014-12-24 11:53:23)  

## 2018-03-10 ENCOUNTER — Telehealth: Payer: Self-pay | Admitting: *Deleted

## 2018-03-10 ENCOUNTER — Ambulatory Visit: Payer: Medicare Other

## 2018-03-10 NOTE — Telephone Encounter (Signed)
Received voice mail message from patient regarding Amaryl 2 mg tablets. Per Dr. Marin Olp, STOP the medication. He verbalized understanding.

## 2018-03-13 ENCOUNTER — Other Ambulatory Visit: Payer: Self-pay | Admitting: Family

## 2018-03-13 DIAGNOSIS — G63 Polyneuropathy in diseases classified elsewhere: Principal | ICD-10-CM

## 2018-03-13 DIAGNOSIS — C801 Malignant (primary) neoplasm, unspecified: Secondary | ICD-10-CM

## 2018-03-13 DIAGNOSIS — C61 Malignant neoplasm of prostate: Secondary | ICD-10-CM

## 2018-03-14 ENCOUNTER — Other Ambulatory Visit: Payer: Self-pay | Admitting: Hematology & Oncology

## 2018-03-14 DIAGNOSIS — R079 Chest pain, unspecified: Secondary | ICD-10-CM | POA: Diagnosis not present

## 2018-03-14 DIAGNOSIS — C61 Malignant neoplasm of prostate: Secondary | ICD-10-CM

## 2018-03-14 DIAGNOSIS — C801 Malignant (primary) neoplasm, unspecified: Secondary | ICD-10-CM

## 2018-03-14 DIAGNOSIS — G63 Polyneuropathy in diseases classified elsewhere: Principal | ICD-10-CM

## 2018-03-14 DIAGNOSIS — R030 Elevated blood-pressure reading, without diagnosis of hypertension: Secondary | ICD-10-CM | POA: Diagnosis not present

## 2018-03-14 MED FILL — GABAPENTIN 300 MG CAPSULE: 300 | 30 days supply | Qty: 120 | Fill #0 | Status: TO

## 2018-03-19 ENCOUNTER — Ambulatory Visit (HOSPITAL_COMMUNITY): Payer: Medicare Other

## 2018-03-19 ENCOUNTER — Encounter (HOSPITAL_COMMUNITY)
Admission: RE | Admit: 2018-03-19 | Discharge: 2018-03-19 | Disposition: A | Discharge status: Home / Self Care | Payer: Medicare Other | Source: Ambulatory Visit | Attending: Hematology & Oncology | Admitting: Hematology & Oncology

## 2018-03-19 DIAGNOSIS — C7A8 Other malignant neuroendocrine tumors: Secondary | ICD-10-CM | POA: Diagnosis not present

## 2018-03-19 DIAGNOSIS — C7800 Secondary malignant neoplasm of unspecified lung: Secondary | ICD-10-CM | POA: Diagnosis not present

## 2018-03-19 DIAGNOSIS — C7A1 Malignant poorly differentiated neuroendocrine tumors: Secondary | ICD-10-CM | POA: Diagnosis not present

## 2018-03-19 MED ORDER — GALLIUM GA 68 DOTATATE IV KIT
3.5000 | PACK | Freq: Once | INTRAVENOUS | Status: AC
  Administered 2018-03-19: 3.5 via INTRAVENOUS

## 2018-03-23 ENCOUNTER — Other Ambulatory Visit: Payer: Self-pay

## 2018-03-23 ENCOUNTER — Encounter (HOSPITAL_BASED_OUTPATIENT_CLINIC_OR_DEPARTMENT_OTHER): Payer: Self-pay | Admitting: Emergency Medicine

## 2018-03-23 ENCOUNTER — Emergency Department (HOSPITAL_BASED_OUTPATIENT_CLINIC_OR_DEPARTMENT_OTHER): Payer: Medicare Other

## 2018-03-23 ENCOUNTER — Observation Stay (HOSPITAL_BASED_OUTPATIENT_CLINIC_OR_DEPARTMENT_OTHER)
Admission: EM | Admit: 2018-03-23 | Discharge: 2018-03-24 | Disposition: A | Discharge status: Home / Self Care | Payer: Medicare Other | Attending: Internal Medicine | Admitting: Internal Medicine

## 2018-03-23 DIAGNOSIS — C7A8 Other malignant neuroendocrine tumors: Secondary | ICD-10-CM | POA: Diagnosis not present

## 2018-03-23 DIAGNOSIS — E785 Hyperlipidemia, unspecified: Secondary | ICD-10-CM | POA: Diagnosis present

## 2018-03-23 DIAGNOSIS — R079 Chest pain, unspecified: Secondary | ICD-10-CM | POA: Diagnosis present

## 2018-03-23 DIAGNOSIS — E119 Type 2 diabetes mellitus without complications: Secondary | ICD-10-CM | POA: Diagnosis not present

## 2018-03-23 DIAGNOSIS — C7951 Secondary malignant neoplasm of bone: Secondary | ICD-10-CM | POA: Diagnosis present

## 2018-03-23 DIAGNOSIS — C7A1 Malignant poorly differentiated neuroendocrine tumors: Secondary | ICD-10-CM | POA: Insufficient documentation

## 2018-03-23 DIAGNOSIS — I10 Essential (primary) hypertension: Secondary | ICD-10-CM | POA: Diagnosis present

## 2018-03-23 DIAGNOSIS — R072 Precordial pain: Principal | ICD-10-CM | POA: Insufficient documentation

## 2018-03-23 DIAGNOSIS — I209 Angina pectoris, unspecified: Secondary | ICD-10-CM | POA: Diagnosis present

## 2018-03-23 DIAGNOSIS — Z85118 Personal history of other malignant neoplasm of bronchus and lung: Secondary | ICD-10-CM | POA: Diagnosis not present

## 2018-03-23 DIAGNOSIS — Z8546 Personal history of malignant neoplasm of prostate: Secondary | ICD-10-CM | POA: Insufficient documentation

## 2018-03-23 DIAGNOSIS — R0789 Other chest pain: Secondary | ICD-10-CM | POA: Diagnosis not present

## 2018-03-23 DIAGNOSIS — R9431 Abnormal electrocardiogram [ECG] [EKG]: Secondary | ICD-10-CM | POA: Diagnosis not present

## 2018-03-23 HISTORY — DX: Type 2 diabetes mellitus without complications: E11.9

## 2018-03-23 LAB — BASIC METABOLIC PANEL
Anion gap: 7 (ref 5–15)
BUN: 23 mg/dL (ref 8–23)
CO2: 22 mmol/L (ref 22–32)
Calcium: 9 mg/dL (ref 8.9–10.3)
Chloride: 107 mmol/L (ref 98–111)
Creatinine, Ser: 1.09 mg/dL (ref 0.61–1.24)
GFR calc Af Amer: >60 mL/min (ref >60)
GFR calc non Af Amer: >60 mL/min (ref >60)
Glucose, Bld: 130 mg/dL — ABNORMAL HIGH (ref 70–99)
Potassium: 4.3 mmol/L (ref 3.5–5.1)
Sodium: 136 mmol/L (ref 135–145)

## 2018-03-23 LAB — CBC WITH DIFFERENTIAL/PLATELET
Abs Immature Granulocytes: 0.57 10*3/uL — ABNORMAL HIGH (ref 0.00–0.07)
Basophils Absolute: 0.1 10*3/uL (ref 0.0–0.1)
Basophils Relative: 0 %
Eosinophils Absolute: 0.1 10*3/uL (ref 0.0–0.5)
Eosinophils Relative: 1 %
HCT: 28.3 % — ABNORMAL LOW (ref 39.0–52.0)
Hemoglobin: 8.9 g/dL — ABNORMAL LOW (ref 13.0–17.0)
Immature Granulocytes: 5 %
Lymphocytes Relative: 9 %
Lymphs Abs: 1.1 10*3/uL (ref 0.7–4.0)
MCH: 30.9 pg (ref 26.0–34.0)
MCHC: 31.4 g/dL (ref 30.0–36.0)
MCV: 98.3 fL (ref 80.0–100.0)
Monocytes Absolute: 1.2 10*3/uL — ABNORMAL HIGH (ref 0.1–1.0)
Monocytes Relative: 10 %
Neutro Abs: 9.1 10*3/uL — ABNORMAL HIGH (ref 1.7–7.7)
Neutrophils Relative %: 75 %
Platelets: 180 10*3/uL (ref 150–400)
RBC: 2.88 MIL/uL — ABNORMAL LOW (ref 4.22–5.81)
RDW: 15.6 % — ABNORMAL HIGH (ref 11.5–15.5)
WBC: 12.1 10*3/uL — ABNORMAL HIGH (ref 4.0–10.5)
nRBC: 0 % (ref 0.0–0.2)

## 2018-03-23 LAB — GLUCOSE, CAPILLARY: Glucose-Capillary: 123 mg/dL — ABNORMAL HIGH (ref 70–99)

## 2018-03-23 LAB — LIPID PANEL
Cholesterol: 230 mg/dL — ABNORMAL HIGH (ref 0–200)
HDL: 46 mg/dL (ref >40)
LDL Cholesterol: 142 mg/dL — ABNORMAL HIGH (ref 0–99)
Total CHOL/HDL Ratio: 5 RATIO
Triglycerides: 209 mg/dL — ABNORMAL HIGH (ref <150)
VLDL: 42 mg/dL — ABNORMAL HIGH (ref 0–40)

## 2018-03-23 LAB — CREATININE, SERUM
Creatinine, Ser: 1.1 mg/dL (ref 0.61–1.24)
GFR calc Af Amer: >60 mL/min (ref >60)
GFR calc non Af Amer: >60 mL/min (ref >60)

## 2018-03-23 LAB — TROPONIN I
Troponin I: <0.03 ng/mL (ref <0.03)
Troponin I: <0.03 ng/mL (ref <0.03)
Troponin I: <0.03 ng/mL (ref <0.03)
Troponin I: <0.03 ng/mL (ref <0.03)

## 2018-03-23 LAB — CBC
HCT: 28.2 % — ABNORMAL LOW (ref 39.0–52.0)
Hemoglobin: 9.1 g/dL — ABNORMAL LOW (ref 13.0–17.0)
MCH: 31.1 pg (ref 26.0–34.0)
MCHC: 32.3 g/dL (ref 30.0–36.0)
MCV: 96.2 fL (ref 80.0–100.0)
Platelets: 209 10*3/uL (ref 150–400)
RBC: 2.93 MIL/uL — ABNORMAL LOW (ref 4.22–5.81)
RDW: 15.6 % — ABNORMAL HIGH (ref 11.5–15.5)
WBC: 11.3 10*3/uL — ABNORMAL HIGH (ref 4.0–10.5)
nRBC: 0 % (ref 0.0–0.2)

## 2018-03-23 MED ORDER — GABAPENTIN 300 MG PO CAPS
300.0000 mg | ORAL_CAPSULE | Freq: Two times a day (BID) | ORAL | Status: DC
  Administered 2018-03-23 – 2018-03-24 (×3): 300 mg via ORAL
  Filled 2018-03-23 (×2): qty 1

## 2018-03-23 MED ORDER — ACETAMINOPHEN 325 MG PO TABS: 650.0000 mg | ORAL_TABLET | Freq: Four times a day (QID) | ORAL | Status: DC | PRN

## 2018-03-23 MED ORDER — INSULIN ASPART 100 UNIT/ML ~~LOC~~ SOLN: 0.0000 [IU] | Freq: Every day | SUBCUTANEOUS | Status: DC

## 2018-03-23 MED ORDER — ONDANSETRON HCL 4 MG PO TABS: 4.0000 mg | ORAL_TABLET | Freq: Four times a day (QID) | ORAL | Status: DC | PRN

## 2018-03-23 MED ORDER — INSULIN ASPART 100 UNIT/ML ~~LOC~~ SOLN: 0.0000 [IU] | Freq: Three times a day (TID) | SUBCUTANEOUS | Status: DC

## 2018-03-23 MED ORDER — ACETAMINOPHEN 650 MG RE SUPP: 650.0000 mg | Freq: Four times a day (QID) | RECTAL | Status: DC | PRN

## 2018-03-23 MED ORDER — SODIUM CHLORIDE 0.9 % IV BOLUS
500.0000 mL | Freq: Once | INTRAVENOUS | Status: AC
  Administered 2018-03-23: 500 mL via INTRAVENOUS

## 2018-03-23 MED ORDER — PENTAFLUOROPROP-TETRAFLUOROETH EX AERO
INHALATION_SPRAY | CUTANEOUS | Status: AC
  Administered 2018-03-23: 1
  Filled 2018-03-23: qty 30

## 2018-03-23 MED ORDER — IOPAMIDOL (ISOVUE-370) INJECTION 76%
100.0000 mL | Freq: Once | INTRAVENOUS | Status: AC | PRN
  Administered 2018-03-23: 54 mL via INTRAVENOUS

## 2018-03-23 MED ORDER — ONDANSETRON HCL 4 MG/2ML IJ SOLN: 4.0000 mg | Freq: Four times a day (QID) | INTRAMUSCULAR | Status: DC | PRN

## 2018-03-23 MED ORDER — NITROGLYCERIN 0.4 MG SL SUBL
0.4000 mg | SUBLINGUAL_TABLET | Freq: Once | SUBLINGUAL | Status: AC
  Administered 2018-03-23: 0.4 mg via SUBLINGUAL
  Filled 2018-03-23: qty 1

## 2018-03-23 MED ORDER — GABAPENTIN 300 MG PO CAPS
600.0000 mg | ORAL_CAPSULE | Freq: Every day | ORAL | Status: DC
  Administered 2018-03-23: 600 mg via ORAL
  Filled 2018-03-23 (×2): qty 2

## 2018-03-23 MED ORDER — ASPIRIN EC 81 MG PO TBEC
81.0000 mg | DELAYED_RELEASE_TABLET | Freq: Every day | ORAL | Status: DC
  Administered 2018-03-23 – 2018-03-24 (×2): 81 mg via ORAL
  Filled 2018-03-23 (×2): qty 1

## 2018-03-23 MED ORDER — ASPIRIN 81 MG PO TABS: 81.0000 mg | ORAL_TABLET | Freq: Every day | ORAL | Status: DC

## 2018-03-23 MED ORDER — ENOXAPARIN SODIUM 40 MG/0.4ML ~~LOC~~ SOLN
40.0000 mg | SUBCUTANEOUS | Status: DC
  Administered 2018-03-23: 40 mg via SUBCUTANEOUS
  Filled 2018-03-23: qty 0.4

## 2018-03-23 NOTE — Assessment & Plan Note (Signed)
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

## 2018-03-23 NOTE — H&P (Signed)
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  He states last evening he had episode of left-sided chest pressure.  He states he did not actually have any pain.  He was lying on his left side in bed.  When he rolled over to his right side, the chest pressure resolved.  He went to sleep.  He woke up this morning on his left side and had left-sided chest pressure again.  This time when he rolled over to his right side the chest pressure did not improve.  Patient sister is a Marine scientist.  Patient told his wife about his symptoms.  Patient sister advised him go to the ER.  In the ER, 2 sets of cardiac markers were negative for myocardial ischemia.  EKG demonstrated normal sinus rhythm.    ED Course: CTPA negative for PE. Cardiac markers negative x 2 sets. EKG NSR.  Review of Systems:  Review of Systems  Constitutional: Negative.   HENT: Negative.   Eyes: Negative.   Respiratory: Negative.   Cardiovascular:       Left-sided chest pressure, denies chest pain.  No radiation.  No shortness of breath.  No diaphoresis.  No nausea no vomiting.  Gastrointestinal: Negative.   Genitourinary: Negative.   Musculoskeletal: Negative.   Skin: Negative.   Neurological: Negative.   Endo/Heme/Allergies: Negative.   Psychiatric/Behavioral: Negative.     Past Medical History:  Diagnosis Date  . Complication of anesthesia   . Constipation    due to pain medication  . 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  Dyspnea    with exertion  . Essential hypertension 07/12/2014  . Goals of care, counseling/discussion 01/29/2018  . High blood sugar   . Neuroendocrine carcinoma of unknown origin ([place]) 12/14/2016  . PONV (postoperative nausea and vomiting)   . Prostate cancer ([place]) 07/23/2011   with mets to sacral, lungs  . Radiation 04/22/14-05/06/14   right sacrum 30 gray  . 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 CIRCUMCISION     when patient was 73 years old  . COLONOSCOPY    . COLONOSCOPY W/ POLYPECTOMY    . ESOPHAGOGASTRODUODENOSCOPY    . HAND SURGERY Left    fracture  . IR IMAGING GUIDED PORT INSERTION  02/10/2018  . LUMBAR LAMINECTOMY/DECOMPRESSION MICRODISCECTOMY Right 09/12/2016   Procedure: LAMINECTOMY AND FORAMINOTOMY RIGHT LUMBAR FIVE- SACRAL ONE, SACRAL ONE- SACRAL TWO DECOMPRESSION OF RIGHT SACRAL ONE NERVE ROOT;  Surgeon: [name], MD;  Location: [facility];  Service: Neurosurgery;  Laterality: Right;  . ROBOT ASSISTED LAPAROSCOPIC RADICAL PROSTATECTOMY    . TONSILECTOMY/ADENOIDECTOMY WITH MYRINGOTOMY       reports that he has never smoked. He has never used smokeless tobacco. He reports that he does not drink alcohol or use drugs.  Allergies  Allergen Reactions  . Codeine Nausea And Vomiting  . Hydrocodone Nausea And Vomiting    Family History  Problem Relation Age of Onset  . Prostate cancer Father   .  Heart disease Father   . Heart disease Mother     Prior to Admission medications   Medication Sig Start Date End Date Taking? Authorizing Provider  acetaminophen (TYLENOL) 500 MG tablet Take 1,000 mg by mouth every 4 (four) hours as needed for moderate pain.    [provider]  aspirin 81 MG tablet Take 81 mg by mouth daily.    [provider]  CALCIUM-IRON-VIT D-VIT K PO Take 1 tablet by mouth 2 (two) times daily.    [provider]  gabapentin (NEURONTIN) 300  MG capsule TAKE 1 CAPSULE BY MOUTH TWICE A DAY THEN TAKE 2 CAPSULES AT BEDTIME 03/17/18   [place], Holli Humbles, NP  gabapentin (NEURONTIN) 300 MG capsule TAKE 1 CAPSULE BY MOUTH TWICE A DAY THEN TAKE 2 CAPSULES AT BEDTIME 03/14/18   Ennever, Rudell Cobb, MD  glimepiride (AMARYL) 2 MG tablet TAKE 1 TABLET (2 MG TOTAL) BY MOUTH DAILY WITH BREAKFAST. 01/24/18   [name], MD  lidocaine-prilocaine (EMLA) cream Apply to affected area once 02/12/18   Ennever, Rudell Cobb, MD  LORazepam (ATIVAN) 0.5 MG tablet Take 1 tablet (0.5 mg total) by mouth every 6 (six) hours as needed (Nausea or vomiting). 02/12/18   [name], MD  ondansetron (ZOFRAN) 8 MG tablet Take 1 tablet (8 mg total) by mouth 2 (two) times daily as needed for nausea or vomiting. 05/15/17   Gus Height, MD  ondansetron (ZOFRAN) 8 MG tablet Take 1 tablet (8 mg total) by mouth 2 (two) times daily as needed for refractory nausea / vomiting. Start on day 3 after carboplatin chemo. 02/12/18   [name], MD  OVER THE COUNTER MEDICATION every morning. Juice Plus -- 8 capsule of each the garden, vineyard, and Publishing copy, Historical, MD  prochlorperazine (COMPAZINE) 10 MG tablet Take 1 tablet (10 mg total) by mouth every 6 (six) hours as needed (Nausea or vomiting). 02/12/18   [name], MD  UNABLE TO FIND Med Name: Juice Plus Omega Blend    [provider]  UNABLE TO FIND Med Name: Juice Plus [address], Garden Blend, [facility]    [provider]  ZETIA 10 MG tablet Take 10 mg by mouth daily.  06/29/14   [provider]    Physical Exam: Vitals:   03/23/18 1430 03/23/18 1530 03/23/18 1545 03/23/18 1700  BP: 132/80 115/65 (!) 124/93 136/78  Pulse: (!) 53 89 90   Resp: 12 20    Temp:   98.3 F (36.8 C)   TempSrc:   Oral   SpO2: 100% 100% 100%   Weight:      Height:        Physical Exam  Nursing note and vitals reviewed. Constitutional: He is oriented to person, place, and time. No  distress.  HENT:  Head: Normocephalic.  Nose: Nose normal.  Eyes: Pupils are equal, round, and reactive to light. Left eye exhibits no discharge.  Neck: Normal range of motion. No JVD present.  Cardiovascular: Normal rate and regular rhythm.  Murmur heard. Soft 1/6 SEM LLSB  Respiratory: Effort normal and breath sounds normal. No respiratory distress. He has no wheezes. He has no rales. He exhibits no tenderness.  GI: Soft. Bowel sounds are normal. He exhibits no distension. There is no abdominal tenderness. There is no rebound.  Musculoskeletal:        General: No deformity or edema.  Neurological: He is alert and oriented to person, place,  and time.  Skin: Skin is warm and dry. He is not diaphoretic.  Port-a-cath right ant chest wall.     Labs on Admission: I have personally reviewed following labs and imaging studies  CBC: Recent Labs  Lab 03/23/18 0847  WBC 12.1*  NEUTROABS 9.1*  HGB 8.9*  HCT 28.3*  MCV 98.3  PLT 315   Basic Metabolic Panel: Recent Labs  Lab 03/23/18 0847  NA 136  K 4.3  CL 107  CO2 22  GLUCOSE 130*  BUN 23  CREATININE 1.09  CALCIUM 9.0   GFR: Estimated Creatinine Clearance: 60.7 mL/min (by C-G formula based on SCr of 1.09 mg/dL). Liver Function Tests: No results for input(s): AST, ALT, ALKPHOS, BILITOT, PROT, ALBUMIN in the last 168 hours. No results for input(s): LIPASE, AMYLASE in the last 168 hours. No results for input(s): AMMONIA in the last 168 hours. Coagulation Profile: No results for input(s): INR, PROTIME in the last 168 hours. Cardiac Enzymes: Recent Labs  Lab 03/23/18 0847 03/23/18 1230  TROPONINI <0.03 <0.03   BNP (last 3 results) No results for input(s): PROBNP in the last 8760 hours. HbA1C: No results for input(s): HGBA1C in the last 72 hours. CBG: No results for input(s): GLUCAP in the last 168 hours. Lipid Profile: No results for input(s): CHOL, HDL, LDLCALC, TRIG, CHOLHDL, LDLDIRECT in the last 72  hours. Thyroid Function Tests: No results for input(s): TSH, T4TOTAL, FREET4, T3FREE, THYROIDAB in the last 72 hours. Anemia Panel: No results for input(s): VITAMINB12, FOLATE, FERRITIN, TIBC, IRON, RETICCTPCT in the last 72 hours. Urine analysis:    Component Value Date/Time   COLORURINE YELLOW 12/01/2016 1436   APPEARANCEUR CLEAR 12/01/2016 1436   LABSPEC 1.010 12/01/2016 1436   PHURINE 7.5 12/01/2016 1436   GLUCOSEU NEGATIVE 12/01/2016 1436   HGBUR NEGATIVE 12/01/2016 1436   BILIRUBINUR NEGATIVE 12/01/2016 1436   KETONESUR NEGATIVE 12/01/2016 1436   PROTEINUR NEGATIVE 12/01/2016 1436   NITRITE NEGATIVE 12/01/2016 1436   LEUKOCYTESUR NEGATIVE 12/01/2016 1436    Radiological Exams on Admission: I have personally reviewed images Dg Chest 2 View  Result Date: 03/23/2018 CLINICAL DATA:  Chest heaviness EXAM: CHEST - 2 VIEW COMPARISON:  03/19/2018 FINDINGS: Cardiac shadows within normal limits. Right-sided chest port is noted in satisfactory position. The lungs are well aerated bilaterally without focal infiltrate or sizable effusion. Tiny bilateral nodules are seen similar to that noted on prior PET-CT. No acute bony abnormality is noted. IMPRESSION: No acute abnormality noted. Electronically Signed   By: Inez Catalina M.D.   On: 03/23/2018 09:28   Ct Angio Chest Pe W And/or Wo Contrast  Result Date: 03/23/2018 CLINICAL DATA:  Chest heaviness EXAM: CT ANGIOGRAPHY CHEST WITH CONTRAST TECHNIQUE: Multidetector CT imaging of the chest was performed using the standard protocol during bolus administration of intravenous contrast. Multiplanar CT image reconstructions and MIPs were obtained to evaluate the vascular anatomy. CONTRAST:  68mL ISOVUE-370 IOPAMIDOL (ISOVUE-370) INJECTION 76% COMPARISON:  PET-CT, 03/19/2018 FINDINGS: Cardiovascular: Satisfactory opacification of the pulmonary arteries to the segmental level. No evidence of pulmonary embolism. Normal heart size. Coronary artery  calcifications. No pericardial effusion. Right chest port catheter. Mediastinum/Nodes: No enlarged mediastinal, hilar, or axillary lymph nodes. Thyroid gland, trachea, and esophagus demonstrate no significant findings. Lungs/Pleura: Numerous bilateral pulmonary nodules, as seen on recent PET-CT. No acute airspace disease. No pleural effusion or pneumothorax. Upper Abdomen: No acute abnormality. Musculoskeletal: No chest wall abnormality. No acute or significant osseous findings. Review of the MIP images confirms the above  findings. IMPRESSION: 1.  Negative examination for pulmonary embolism. 2.  Pulmonary metastatic disease as seen on recent PET-CT. 3.  Coronary artery disease. Electronically Signed   By: Eddie Candle M.D.   On: 03/23/2018 10:17    EKG: I have personally reviewed EKG: NSR  Assessment/Plan Principal Problem:   Chest pain Active Problems:   Essential hypertension   Type 2 diabetes mellitus (HCC)   Cancer, metastatic to bone (HCC)   Dyslipidemia   Neuroendocrine carcinoma of unknown origin ([place])    Chest pain Assign to observation telemetry bed.  Rule out MI with serial cardiac enzymes.  Patient symptomatology is atypical and not indicative of cardiac disease.  Given the fact that he has metastatic neuroendocrine cancer and that he is actually improving with chemotherapy, I think the best management strategy is to rule him out for an MI, check an echocardiogram to rule ensure he does not have wall motion abnormality and then discharged him home if these are negative.  Patient does have intensive chemotherapy scheduled this month and in April.  If he completes chemotherapy and he still having chest symptoms, he certainly can have an outpatient stress test in May 2020.  Patient continue aspirin.  Patient may benefit from sublingual nitroglycerin only if he has chest symptoms.  Discussed this with the patient and his wife and son who are all in agreement.  Essential  hypertension Stable.  Neuroendocrine carcinoma of unknown origin [facility]) Currently undergoing chemotherapy via heme-onc.  Cancer, metastatic to bone [facility]) Bone pain is improved with chemotherapy.  Dyslipidemia Stable.  Type 2 diabetes mellitus ([place]) Placed patient on sliding scale insulin.  Hold oral hypoglycemics.   DVT prophylaxis: Lovenox Code Status: Full Code Family Communication: discussed with wife Freda Munro and son Angelica Chessman at bedside Disposition Plan: DC to home if cardiac enz negative and echo is normal.  Deferred outpatient stress test until after he has completed chemotherapy.  Consults called: None  Admission status: Observation, Telemetry bed   Kristopher Oppenheim, DO [facility] 03/23/2018, 5:50 PM

## 2018-03-23 NOTE — Assessment & Plan Note (Signed)
Placed patient on sliding scale insulin.  Hold oral hypoglycemics.

## 2018-03-23 NOTE — Subjective & Objective (Signed)
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  He states last evening he had episode of left-sided chest pressure.  He states he did not actually have any pain.  He was lying on his left side in bed.  When he rolled over to his right side, the chest pressure resolved.  He went to sleep.  He woke up this morning on his left side and had left-sided chest pressure again.  This time when he rolled over to his right side the chest pressure did not improve.  Patient sister is a Marine scientist.  Patient told his wife about his symptoms.  Patient sister advised him go to the ER.  In the ER, 2 sets of cardiac markers were negative for myocardial ischemia.  EKG demonstrated normal sinus rhythm.

## 2018-03-23 NOTE — Assessment & Plan Note (Signed)
Currently undergoing chemotherapy via heme-onc.

## 2018-03-23 NOTE — ED Notes (Signed)
Patient transported to CT 

## 2018-03-23 NOTE — ED Triage Notes (Signed)
Patient states that he woke up about 3 am with chest heaviness - he tried position changes and and Asprin that helped but he states " I still don't feel right"

## 2018-03-23 NOTE — ED Provider Notes (Signed)
Emergency Department Provider Note   I have reviewed the triage vital signs and the nursing notes.   HISTORY  Chief Complaint Chest Pain   HPI [name] is a 73 y.o. male with PMH of DM, HTN, and neuroendocrine carcinoma currently on chemotherapy presents to the emergency department for evaluation of chest heaviness and diaphoresis which began this morning.  Patient awoke at 5:30 AM with symptoms.  He had a similar episode 2 weeks ago which improved with position change but pain was persistent this morning.  He took a full dose aspirin and pain decreased somewhat but continues to have some mild heaviness at this time.  He denies any shortness of breath, productive cough, fevers, chills, or hemoptysis.  No vomiting or diarrhea.  He has no prior history of ACS or PE.  He is compliant with his home medications and is responding well to chemotherapy.  Other than the chest heaviness and diaphoresis this morning he is overall feeling well.    Past Medical History:  Diagnosis Date  . Complication of anesthesia   . Constipation    due to pain medication  . 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 Dyspnea    with exertion  . Essential hypertension 07/12/2014  . Goals of care, counseling/discussion 01/29/2018  . High blood sugar   . Neuroendocrine carcinoma of unknown origin ([place]) 12/14/2016  . PONV (postoperative nausea and vomiting)   . Prostate cancer ([place]) 07/23/2011   with mets to sacral, lungs  . Radiation 04/22/14-05/06/14   right sacrum 30 gray  . 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 Chest pain 03/23/2018  . Goals of care, counseling/discussion 01/29/2018  . Neuroendocrine carcinoma of unknown origin ([place]) 12/14/2016  . Secondary neuroendocrine tumor of bone ([place]) 12/14/2016  . 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  Metastasis to bone ([place]) 09/12/2016  . Cancer, metastatic to bone ([place]) 08/24/2015  . Dyslipidemia 07/12/2014  . Essential hypertension 07/12/2014  . Ischemic chest pain ([place]) 07/12/2014  . Pain of right posterior thigh 02/15/2014    Past Surgical History:  Procedure Laterality Date  . CIRCUMCISION     when patient was 73 years old  . COLONOSCOPY    . COLONOSCOPY W/ POLYPECTOMY    . ESOPHAGOGASTRODUODENOSCOPY    . HAND SURGERY Left    fracture  . IR IMAGING GUIDED PORT INSERTION  02/10/2018  . LUMBAR LAMINECTOMY/DECOMPRESSION MICRODISCECTOMY Right 09/12/2016   Procedure: LAMINECTOMY AND FORAMINOTOMY RIGHT LUMBAR FIVE- SACRAL ONE, SACRAL ONE- SACRAL TWO DECOMPRESSION OF RIGHT SACRAL ONE NERVE ROOT;  Surgeon: [name], MD;  Location: [place];  Service: Neurosurgery;  Laterality: Right;  . ROBOT ASSISTED LAPAROSCOPIC RADICAL PROSTATECTOMY    . TONSILECTOMY/ADENOIDECTOMY WITH MYRINGOTOMY      Allergies Codeine and Hydrocodone  Family History  Problem Relation Age of Onset  . Prostate cancer Father   . Heart disease Father   . Heart disease Mother     Social History Social History   Tobacco Use  . Smoking status: Never Smoker  . Smokeless tobacco: Never Used  . Tobacco comment: never used tobacco  Substance Use Topics  . Alcohol use: No    Alcohol/week: 0.0 standard drinks  . Drug use: No    Review of Systems  Constitutional: No fever/chills Eyes: No visual changes. ENT: No sore throat. Cardiovascular: Positive chest pain  and diaphoresis.  Respiratory: Denies shortness of breath. Gastrointestinal: No abdominal pain.  No nausea, no vomiting.  No diarrhea.  No constipation. Genitourinary: Negative for dysuria. Musculoskeletal: Negative for back pain. Skin: Negative for rash. Neurological: Negative for headaches, focal weakness or numbness.  10-point ROS otherwise negative.  ____________________________________________   PHYSICAL EXAM:  VITAL SIGNS: ED  Triage Vitals  Enc Vitals Group     BP 03/23/18 0830 (!) 146/83     Pulse Rate 03/23/18 0830 90     Resp 03/23/18 0830 (!) 24     Temp --      Temp src --      SpO2 03/23/18 0830 100 %     Weight 03/23/18 0830 149 lb 14.6 oz (68 kg)     Height 03/23/18 0830 5\' 8"  (1.727 m)     Pain Score 03/23/18 0829 3   Constitutional: Alert and oriented. Well appearing and in no acute distress. Eyes: Conjunctivae are normal. Head: Atraumatic. Nose: No congestion/rhinnorhea. Mouth/Throat: Mucous membranes are moist. Neck: No stridor.  Cardiovascular: Normal rate, regular rhythm. Good peripheral circulation. Grossly normal heart sounds. Well-appearing port without hematoma or cellulitis.  Respiratory: Normal respiratory effort.  No retractions. Lungs CTAB. Gastrointestinal: Soft and nontender. No distention.  Musculoskeletal: No lower extremity tenderness nor edema. No gross deformities of extremities. Neurologic:  Normal speech and language. No gross focal neurologic deficits are appreciated.  Skin:  Skin is warm, dry and intact. No rash noted.  ____________________________________________   LABS (all labs ordered are listed, but only abnormal results are displayed)  Labs Reviewed  BASIC METABOLIC PANEL - Abnormal; Notable for the following components:      Result Value   Glucose, Bld 130 (*)    All other components within normal limits  CBC WITH DIFFERENTIAL/PLATELET - Abnormal; Notable for the following components:   WBC 12.1 (*)    RBC 2.88 (*)    Hemoglobin 8.9 (*)    HCT 28.3 (*)    RDW 15.6 (*)    Neutro Abs 9.1 (*)    Monocytes Absolute 1.2 (*)    Abs Immature Granulocytes 0.57 (*)    All other components within normal limits  TROPONIN I  TROPONIN I   ____________________________________________  EKG   EKG Interpretation  Date/Time:  Sunday March 23 2018 08:30:57 EDT Ventricular Rate:  85 PR Interval:    QRS Duration: 88 QT Interval:  397 QTC Calculation: 473 R  Axis:   -2 Text Interpretation:  Sinus rhythm Abnormal R-wave progression, early transition No STEMI.  Confirmed by Nanda Quinton [id number]) on 03/23/2018 8:39:21 AM       ____________________________________________  RADIOLOGY  Dg Chest 2 View  Result Date: 03/23/2018 CLINICAL DATA:  Chest heaviness EXAM: CHEST - 2 VIEW COMPARISON:  03/19/2018 FINDINGS: Cardiac shadows within normal limits. Right-sided chest port is noted in satisfactory position. The lungs are well aerated bilaterally without focal infiltrate or sizable effusion. Tiny bilateral nodules are seen similar to that noted on prior PET-CT. No acute bony abnormality is noted. IMPRESSION: No acute abnormality noted. Electronically Signed   By: Inez Catalina M.D.   On: 03/23/2018 09:28   Ct Angio Chest Pe W And/or Wo Contrast  Result Date: 03/23/2018 CLINICAL DATA:  Chest heaviness EXAM: CT ANGIOGRAPHY CHEST WITH CONTRAST TECHNIQUE: Multidetector CT imaging of the chest was performed using the standard protocol during bolus administration of intravenous contrast. Multiplanar CT image reconstructions and MIPs were obtained to evaluate the vascular anatomy. CONTRAST:  33mL ISOVUE-370 IOPAMIDOL (ISOVUE-370) INJECTION 76% COMPARISON:  PET-CT, 03/19/2018 FINDINGS: Cardiovascular: Satisfactory opacification of the pulmonary arteries to the segmental level. No evidence of pulmonary embolism. Normal heart size. Coronary artery calcifications. No pericardial effusion. Right chest port catheter. Mediastinum/Nodes: No enlarged mediastinal, hilar, or axillary lymph nodes. Thyroid gland, trachea, and esophagus demonstrate no significant findings. Lungs/Pleura: Numerous bilateral pulmonary nodules, as seen on recent PET-CT. No acute airspace disease. No pleural effusion or pneumothorax. Upper Abdomen: No acute abnormality. Musculoskeletal: No chest wall abnormality. No acute or significant osseous findings. Review of the MIP images confirms the above findings.  IMPRESSION: 1.  Negative examination for pulmonary embolism. 2.  Pulmonary metastatic disease as seen on recent PET-CT. 3.  Coronary artery disease. Electronically Signed   By: Eddie Candle M.D.   On: 03/23/2018 10:17    ____________________________________________   PROCEDURES  Procedure(s) performed:   Procedures  None  ____________________________________________   INITIAL IMPRESSION / ASSESSMENT AND PLAN / ED COURSE  Pertinent labs & imaging results that were available during my care of the patient were reviewed by me and considered in my medical decision making (see chart for details).  Patient presents to the emergency department for evaluation of chest heaviness/pain.  He had associated diaphoresis.  Afebrile.  He is on chemotherapy for active neuroendocrine carcinoma.  Lower suspicion for PE but patient is at elevated risk.  Plan for troponin, labs, chest x-ray, and reassess.  Patient may require CT angios of the chest given elevated PE risk but will reassess after labs. HEART score 5.   09:30 AM  Chest pain has resolved entirely and improved after nitroglycerin.  Chest x-ray reviewed with no acute findings.  Labs are unremarkable including troponin.  I have ordered a PE study and will likely admit with moderate risk for MACE.   Discussed patient's case with Hospitalist to request admission. Patient and family (if present) updated with plan. Care transferred to [facility] service.  I reviewed all nursing notes, vitals, pertinent old records, EKGs, labs, imaging (as available).  12:35 PM Second troponin negative. No CP. Awaiting bed.   ____________________________________________  FINAL CLINICAL IMPRESSION(S) / ED DIAGNOSES  Final diagnoses:  Precordial chest pain    MEDICATIONS GIVEN DURING THIS VISIT:  Medications  sodium chloride 0.9 % bolus 500 mL (0 mLs Intravenous Stopped 03/23/18 1029)  nitroGLYCERIN (NITROSTAT) SL tablet 0.4 mg (0.4 mg Sublingual Given  03/23/18 0902)  pentafluoroprop-tetrafluoroeth (GEBAUERS) aerosol (1 application  Given 01/17/43 0903)  iopamidol (ISOVUE-370) 76 % injection 100 mL (54 mLs Intravenous Contrast Given 03/23/18 0949)    Note:  This document was prepared using Dragon voice recognition software and may include unintentional dictation errors.  Nanda Quinton, MD Emergency Medicine    Long, [facility], MD 03/23/18 1330

## 2018-03-23 NOTE — Assessment & Plan Note (Signed)
Stable

## 2018-03-23 NOTE — Assessment & Plan Note (Signed)
Assign to observation telemetry bed.  Rule out MI with serial cardiac enzymes.  Patient symptomatology is atypical and not indicative of cardiac disease.  Given the fact that he has metastatic neuroendocrine cancer and that he is actually improving with chemotherapy, I think the best management strategy is to rule him out for an MI, check an echocardiogram to rule ensure he does not have wall motion abnormality and then discharged him home if these are negative.  Patient does have intensive chemotherapy scheduled this month and in April.  If he completes chemotherapy and he still having chest symptoms, he certainly can have an outpatient stress test in May 2020.  Patient continue aspirin.  Patient may benefit from sublingual nitroglycerin only if he has chest symptoms.  Discussed this with the patient and his wife and son who are all in agreement.

## 2018-03-24 ENCOUNTER — Observation Stay (HOSPITAL_BASED_OUTPATIENT_CLINIC_OR_DEPARTMENT_OTHER): Payer: Medicare Other

## 2018-03-24 DIAGNOSIS — I10 Essential (primary) hypertension: Secondary | ICD-10-CM | POA: Diagnosis not present

## 2018-03-24 DIAGNOSIS — C7951 Secondary malignant neoplasm of bone: Secondary | ICD-10-CM

## 2018-03-24 DIAGNOSIS — R0789 Other chest pain: Secondary | ICD-10-CM

## 2018-03-24 DIAGNOSIS — R072 Precordial pain: Secondary | ICD-10-CM | POA: Diagnosis not present

## 2018-03-24 DIAGNOSIS — R079 Chest pain, unspecified: Secondary | ICD-10-CM | POA: Diagnosis not present

## 2018-03-24 DIAGNOSIS — C7A8 Other malignant neuroendocrine tumors: Secondary | ICD-10-CM | POA: Diagnosis not present

## 2018-03-24 LAB — ECHOCARDIOGRAM COMPLETE
Height: 68 in
Weight: 2347.2 oz

## 2018-03-24 LAB — GLUCOSE, CAPILLARY
Glucose-Capillary: 118 mg/dL — ABNORMAL HIGH (ref 70–99)
Glucose-Capillary: 83 mg/dL (ref 70–99)

## 2018-03-24 MED ORDER — HEPARIN SOD (PORK) LOCK FLUSH 100 UNIT/ML IV SOLN
500.0000 [IU] | INTRAVENOUS | Status: AC | PRN
  Administered 2018-03-24: 500 [IU]

## 2018-03-24 NOTE — Progress Notes (Signed)
  Echocardiogram 2D Echocardiogram has been performed.  [name] 03/24/2018, 8:40 AM

## 2018-03-24 NOTE — Discharge Summary (Signed)
Physician Discharge Summary  [name] NID:[id number] DOB: 08-29-1945 DOA: 03/23/2018  PCP: Nicoletta Dress, MD  Admit date: 03/23/2018 Discharge date: 03/24/2018  Time spent: 35 minutes  Recommendations for Outpatient Follow-up:  1. Follow-up with primary care physician in 1 week 2. 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  He states last evening he had episode of left-sided chest pressure.  He states he did not actually have any pain.  He was lying on his left side in bed.  When he rolled over to his right side, the chest pressure resolved.  He went to sleep.  He woke up this morning on his left side and had left-sided chest pressure again.  This time when he rolled over to his right side the chest pressure did not improve.  Patient sister is a Marine scientist.  Patient told his wife about his symptoms.  Patient sister advised him go to the ER.  Work-up in the emergency department with EKG, cardiac enzymes were unremarkable.  Patient did not have any further episodes of chest pain.  Patient has a cardiologist, recommended patient to follow-up in 1 to 2 days for possible stress test.  Echocardiogram showed preserved left ventricular function without any wall motion  abnormality.   Procedures: Echocardiogram  Discharge Exam: Vitals:   03/24/18 0638 03/24/18 0943  BP: 100/60 117/69  Pulse: 79   Resp: 15 17  Temp: 97.7 F (36.5 C)   SpO2: 100% 100%    Constitutional: He is oriented to person, place, and time. No distress.  HENT:  Head: Normocephalic.  Nose: Nose normal.  Eyes: Pupils are equal, round, and reactive to light. Left eye exhibits no discharge.  Neck: Normal range of motion. No JVD present.  Cardiovascular: Normal rate and regular rhythm.  Murmur heard. Soft 1/6 SEM LLSB  Respiratory: Effort normal and breath sounds normal. No respiratory distress. He has no wheezes. He has no rales. He exhibits no tenderness.  GI: Soft. Bowel sounds are normal. He exhibits no distension. There is no abdominal tenderness. There is no rebound.  Musculoskeletal:        General: No deformity or edema.  Neurological: He is alert and oriented to person, place, and time.  Skin: Skin is warm and dry. He is not diaphoretic.  Port-a-cath right ant chest wall.  Discharge Instructions   Discharge Instructions    Diet - low sodium heart healthy   Complete by:  As directed    Increase activity slowly   Complete by:  As directed      Allergies as of 03/24/2018      Reactions   Codeine Nausea And Vomiting   Hydrocodone Nausea And Vomiting      Medication List    STOP taking these medications   UNABLE TO FIND   UNABLE TO  FIND     TAKE these medications   acetaminophen 500 MG tablet Commonly known as:  TYLENOL Take 1,000 mg by mouth every 4 (four) hours as needed for moderate pain. Notes to patient:  No recent doses, take as ordered.   aspirin 81 MG tablet Take 81 mg by mouth daily.   CALCIUM-IRON-VIT D-VIT K PO Take 1 tablet by mouth 2 (two) times daily.   gabapentin 300 MG capsule Commonly known as:  NEURONTIN TAKE 1 CAPSULE BY MOUTH TWICE A DAY THEN TAKE 2 CAPSULES AT BEDTIME What changed:    See the new instructions.  Another  medication with the same name was removed. Continue taking this medication, and follow the directions you see here.   glimepiride 2 MG tablet Commonly known as:  AMARYL TAKE 1 TABLET (2 MG TOTAL) BY MOUTH DAILY WITH BREAKFAST.   lidocaine-prilocaine cream Commonly known as:  EMLA Apply to affected area once   LORazepam 0.5 MG tablet Commonly known as:  Ativan Take 1 tablet (0.5 mg total) by mouth every 6 (six) hours as needed (Nausea or vomiting).   ondansetron 8 MG tablet Commonly known as:  ZOFRAN Take 1 tablet (8 mg total) by mouth 2 (two) times daily as needed for nausea or vomiting.   ondansetron 8 MG tablet Commonly known as:  Zofran Take 1 tablet (8 mg total) by mouth 2 (two) times daily as needed for refractory nausea / vomiting. Start on day 3 after carboplatin chemo.   OVER THE COUNTER MEDICATION every morning. Juice Plus -- 8 capsule of each the garden, vineyard, and orchead   prochlorperazine 10 MG tablet Commonly known as:  COMPAZINE Take 1 tablet (10 mg total) by mouth every 6 (six) hours as needed (Nausea or vomiting).   Zetia 10 MG tablet Generic drug:  ezetimibe Take 10 mg by mouth daily.      Allergies  Allergen Reactions  . Codeine Nausea And Vomiting  . Hydrocodone Nausea And Vomiting   Follow-up Information    Nicoletta Dress, MD. Schedule an appointment as soon as possible for a visit in 1 week(s).   Specialty:  Internal Medicine Contact information: [address] [place] [address] [phone]        Cardiology. Schedule an appointment as soon as possible for a visit in 2 day(s).            The results of significant diagnostics from this hospitalization (including imaging, microbiology, ancillary and laboratory) are listed below for reference.    Significant Diagnostic Studies: Dg Chest 2 View  Result Date: 03/23/2018 CLINICAL DATA:  Chest heaviness EXAM: CHEST - 2 VIEW COMPARISON:  03/19/2018 FINDINGS:  Cardiac shadows within normal limits. Right-sided chest port is noted in satisfactory position. The lungs are well aerated bilaterally without focal infiltrate or sizable effusion. Tiny bilateral nodules are seen similar to that noted on prior PET-CT. No acute bony abnormality is noted. IMPRESSION: No acute abnormality noted. Electronically Signed   By: Inez Catalina M.D.   On: 03/23/2018 09:28   Ct Angio Chest Pe W And/or Wo Contrast  Result Date: 03/23/2018 CLINICAL DATA:  Chest heaviness EXAM: CT ANGIOGRAPHY CHEST WITH CONTRAST TECHNIQUE: Multidetector CT imaging of the chest was performed using the standard protocol during bolus administration of intravenous contrast. Multiplanar CT image reconstructions and MIPs were obtained to evaluate the vascular anatomy. CONTRAST:  35mL ISOVUE-370 IOPAMIDOL (ISOVUE-370) INJECTION 76% COMPARISON:  PET-CT, 03/19/2018 FINDINGS: Cardiovascular: Satisfactory opacification of the pulmonary  arteries to the segmental level. No evidence of pulmonary embolism. Normal heart size. Coronary artery calcifications. No pericardial effusion. Right chest port catheter. Mediastinum/Nodes: No enlarged mediastinal, hilar, or axillary lymph nodes. Thyroid gland, trachea, and esophagus demonstrate no significant findings. Lungs/Pleura: Numerous bilateral pulmonary nodules, as seen on recent PET-CT. No acute airspace disease. No pleural effusion or pneumothorax. Upper Abdomen: No acute abnormality. Musculoskeletal: No chest wall abnormality. No acute or significant osseous findings. Review of the MIP images confirms the above findings. IMPRESSION: 1.  Negative examination for pulmonary embolism. 2.  Pulmonary metastatic disease as seen on recent PET-CT. 3.  Coronary artery disease. Electronically Signed   By: Eddie Candle M.D.   On: 03/23/2018 10:17   Nm Pet (netspot Ga 68 Dotatate) Skull Base To Mid Thigh  Result Date: 03/19/2018 CLINICAL DATA:  Neuroendocrine tumor of potential prostate  origin. Pulmonary metastasis. Patient status post Lu177 DOTATATE peptide receptor therapy with subsequent progression. Patient post 2 rounds of chemotherapy. EXAM: NUCLEAR MEDICINE PET SKULL BASE TO THIGH TECHNIQUE: 3.5 mCi Ga 9 DOTATATE was injected intravenously. Full-ring PET imaging was performed from the skull base to thigh after the radiotracer. CT data was obtained and used for attenuation correction and anatomic localization. COMPARISON:  DOTATATE PET scan 01/21/2018, 11 15 19  FINDINGS: NECK No radiotracer activity in neck lymph nodes. Incidental CT findings: None CHEST Marked reduction in size of the bilateral pulmonary nodules. Example nodule in the RIGHT upper lobe measures 10 mm (image 76/4) compared to 18 mm on prior. Nodule in the RIGHT lower lobe measures 6 mm (image 85/4) compared to 18 mm on prior. Most inferior RIGHT lower lobe 10 mm nodule (image 92/4) decreased from 20 mm. Additionally, there is near complete resolution of the mild to moderate radiotracer activity previously associated with the nodules. Only 2 small nodules have persistent radiotracer activity. One nodule in the inferior RIGHT lower lobe with SUV max equal 3.5 decreased from 6.3. One nodule in the LEFT lower lobe with SUV max equal 1.8 decreased from 4.9. No new pulmonary nodules. Incidental CT finding:Port in the anterior chest wall with tip in distal SVC. ABDOMEN/PELVIS: No abnormal activity in liver. No activity in abdominopelvic lymph nodes. No radiotracer activity in the prostate bed. Post prostatectomy Physiologic activity noted in the liver, spleen, adrenal glands and kidneys. Incidental CT findings:None SKELETON: Again demonstrated sclerotic lesion within the RIGHT sacrum without associated radiotracer activity. No new lesions within the skeleton by CT scan or DOTATATE PET scan. IMPRESSION: 1. Marked positive response to chemotherapy with reduction in size of bilateral pulmonary nodules. All nodules are also reduced in  radiotracer activity with only 2 nodules remaining with associated radiotracer activity (also decreased from comparison exam). 2. No evidence of metastatic disease outside of the thorax other than the stable sclerotic lesion in the RIGHT sacrum. 3. No new or progressive disease. Electronically Signed   By: Suzy Bouchard M.D.   On: 03/19/2018 14:05    Microbiology: No results found for this or any previous visit (from the past 240 hour(s)).   Labs: Basic Metabolic Panel: Recent Labs  Lab 03/23/18 0847 03/23/18 1847  NA 136  --   K 4.3  --   CL 107  --   CO2 22  --   GLUCOSE 130*  --   BUN 23  --   CREATININE 1.09 1.10  CALCIUM 9.0  --    Liver Function Tests: No results for input(s): AST, ALT, ALKPHOS, BILITOT, PROT, ALBUMIN in the  last 168 hours. No results for input(s): LIPASE, AMYLASE in the last 168 hours. No results for input(s): AMMONIA in the last 168 hours. CBC: Recent Labs  Lab 03/23/18 0847 03/23/18 1847  WBC 12.1* 11.3*  NEUTROABS 9.1*  --   HGB 8.9* 9.1*  HCT 28.3* 28.2*  MCV 98.3 96.2  PLT 180 209   Cardiac Enzymes: Recent Labs  Lab 03/23/18 0847 03/23/18 1230 03/23/18 1847 03/23/18 2247  TROPONINI <0.03 <0.03 <0.03 <0.03   BNP: BNP (last 3 results) No results for input(s): BNP in the last 8760 hours.  ProBNP (last 3 results) No results for input(s): PROBNP in the last 8760 hours.  CBG: Recent Labs  Lab 03/23/18 2147 03/24/18 0736 03/24/18 1204  GLUCAP 123* 118* 83       Signed:  Jessyka Austria MD.  [facility] 03/24/2018, 4:26 PM

## 2018-03-26 ENCOUNTER — Inpatient Hospital Stay (HOSPITAL_BASED_OUTPATIENT_CLINIC_OR_DEPARTMENT_OTHER): Payer: Medicare Other | Admitting: Hematology & Oncology

## 2018-03-26 ENCOUNTER — Encounter: Payer: Self-pay | Admitting: Hematology & Oncology

## 2018-03-26 ENCOUNTER — Ambulatory Visit: Payer: Medicare Other | Admitting: Hematology & Oncology

## 2018-03-26 ENCOUNTER — Ambulatory Visit: Payer: Medicare Other

## 2018-03-26 ENCOUNTER — Other Ambulatory Visit: Payer: Self-pay

## 2018-03-26 ENCOUNTER — Inpatient Hospital Stay: Payer: Medicare Other | Attending: Hematology & Oncology

## 2018-03-26 ENCOUNTER — Inpatient Hospital Stay: Payer: Medicare Other

## 2018-03-26 ENCOUNTER — Other Ambulatory Visit: Payer: Medicare Other

## 2018-03-26 VITALS — BP 125/77 | HR 95 | Temp 98.0°F | Resp 17 | Ht 68.0 in | Wt 151.1 lb

## 2018-03-26 DIAGNOSIS — C61 Malignant neoplasm of prostate: Secondary | ICD-10-CM | POA: Insufficient documentation

## 2018-03-26 DIAGNOSIS — Z5111 Encounter for antineoplastic chemotherapy: Secondary | ICD-10-CM | POA: Insufficient documentation

## 2018-03-26 DIAGNOSIS — C7951 Secondary malignant neoplasm of bone: Secondary | ICD-10-CM

## 2018-03-26 DIAGNOSIS — C7A8 Other malignant neuroendocrine tumors: Secondary | ICD-10-CM

## 2018-03-26 DIAGNOSIS — C7800 Secondary malignant neoplasm of unspecified lung: Secondary | ICD-10-CM

## 2018-03-26 DIAGNOSIS — Z7984 Long term (current) use of oral hypoglycemic drugs: Secondary | ICD-10-CM | POA: Diagnosis not present

## 2018-03-26 DIAGNOSIS — E119 Type 2 diabetes mellitus without complications: Secondary | ICD-10-CM

## 2018-03-26 DIAGNOSIS — Z7982 Long term (current) use of aspirin: Secondary | ICD-10-CM | POA: Diagnosis not present

## 2018-03-26 DIAGNOSIS — Z79899 Other long term (current) drug therapy: Secondary | ICD-10-CM | POA: Insufficient documentation

## 2018-03-26 DIAGNOSIS — Z5189 Encounter for other specified aftercare: Secondary | ICD-10-CM | POA: Diagnosis not present

## 2018-03-26 LAB — CMP (CANCER CENTER ONLY)
ALT: 1187 U/L (ref 0–44)
AST: 2022 U/L (ref 15–41)
Albumin: 4.3 g/dL (ref 3.5–5.0)
Alkaline Phosphatase: 113 U/L (ref 38–126)
Anion gap: 8 (ref 5–15)
BUN: 22 mg/dL (ref 8–23)
CO2: 26 mmol/L (ref 22–32)
Calcium: 10 mg/dL (ref 8.9–10.3)
Chloride: 102 mmol/L (ref 98–111)
Creatinine: 1.17 mg/dL (ref 0.61–1.24)
GFR, Est AFR Am: >60 mL/min (ref >60)
GFR, Estimated: >60 mL/min (ref >60)
Glucose, Bld: 117 mg/dL — ABNORMAL HIGH (ref 70–99)
Potassium: 4 mmol/L (ref 3.5–5.1)
Sodium: 136 mmol/L (ref 135–145)
Total Bilirubin: 0.4 mg/dL (ref 0.3–1.2)
Total Protein: 6.9 g/dL (ref 6.5–8.1)

## 2018-03-26 LAB — CBC WITH DIFFERENTIAL (CANCER CENTER ONLY)
Abs Immature Granulocytes: 0.09 10*3/uL — ABNORMAL HIGH (ref 0.00–0.07)
Basophils Absolute: 0 10*3/uL (ref 0.0–0.1)
Basophils Relative: 1 %
Eosinophils Absolute: 0 10*3/uL (ref 0.0–0.5)
Eosinophils Relative: 1 %
HCT: 29.2 % — ABNORMAL LOW (ref 39.0–52.0)
Hemoglobin: 9.3 g/dL — ABNORMAL LOW (ref 13.0–17.0)
Immature Granulocytes: 2 %
Lymphocytes Relative: 13 %
Lymphs Abs: 0.7 10*3/uL (ref 0.7–4.0)
MCH: 31.1 pg (ref 26.0–34.0)
MCHC: 31.8 g/dL (ref 30.0–36.0)
MCV: 97.7 fL (ref 80.0–100.0)
Monocytes Absolute: 0.6 10*3/uL (ref 0.1–1.0)
Monocytes Relative: 11 %
Neutro Abs: 3.9 10*3/uL (ref 1.7–7.7)
Neutrophils Relative %: 72 %
Platelet Count: 279 10*3/uL (ref 150–400)
RBC: 2.99 MIL/uL — ABNORMAL LOW (ref 4.22–5.81)
RDW: 16.2 % — ABNORMAL HIGH (ref 11.5–15.5)
WBC Count: 5.3 10*3/uL (ref 4.0–10.5)
nRBC: 0 % (ref 0.0–0.2)

## 2018-03-26 LAB — LACTATE DEHYDROGENASE: LDH: 1498 U/L — ABNORMAL HIGH (ref 98–192)

## 2018-03-26 LAB — TSH: TSH: 5.337 u[IU]/mL — ABNORMAL HIGH (ref 0.320–4.118)

## 2018-03-26 MED ORDER — METHYLPREDNISOLONE SODIUM SUCC 125 MG IJ SOLR
INTRAMUSCULAR | Status: AC
  Filled 2018-03-26: qty 2

## 2018-03-26 MED ORDER — PREDNISONE 20 MG PO TABS: ORAL_TABLET | ORAL | 2 refills | Status: DC

## 2018-03-26 MED ORDER — SODIUM CHLORIDE 0.9 % IV SOLN
INTRAVENOUS | Status: DC
  Administered 2018-03-26: 12:00:00 via INTRAVENOUS
  Filled 2018-03-26 (×2): qty 250

## 2018-03-26 MED ORDER — METHYLPREDNISOLONE SODIUM SUCC 125 MG IJ SOLR
125.0000 mg | Freq: Once | INTRAMUSCULAR | Status: AC
  Administered 2018-03-26: 125 mg via INTRAVENOUS

## 2018-03-26 MED FILL — predniSONE 20 MG TABS: 20 | 38 days supply | Qty: 90 | Fill #0

## 2018-03-26 NOTE — Progress Notes (Signed)
Hematology and Oncology Follow Up Visit  [name] [id number] 08-28-1945 73 y.o. 03/26/2018   Principle Diagnosis:  Metastatic small cell carcinoma --unknown primary  Metastatic Prostate cancer -- castrate sensitive   Current Therapy:    Zytiga 1000 mg by mouth daily - discontinued on 08/15/2016  Xtandi 160 mg po q day - start on 08/15/2016 - discontinued  Xgeva 120 mg subcutaneous Q3 months - due in Jan 2020  Lupron 22 mg IM every 3 months - due in Jan 2020  Radium-223 therapy -- s/p cycle #6  Palliative radiation to the right sacrum  Lutathera - s/p cycle 4/4 --last dose on 07/11/2017  Carbo/VP-16/Tecentriq --  S/p cycle #2      Interim History:  [name] is back for followup.  As expected, he responded incredibly well to treatment.  His PET scan that he had done on 03/19/2018 show that the majority of his lung mets were gone.  There is very little activity.  Unfortunately, his liver studies are incredibly high.  His SGPT/SGOT are over 1000/2000 respectively.  The LDH is over 1400.  I have to believe that this is all from the [name].  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  He actually feels great.  He has had no nausea or vomiting.  He has had no diarrhea.  He has had no nausea or vomiting.  He looks incredibly well.  I just hate the fact that his LFTs are so high.  We clearly are not going be able to treat him for the next 2 or 3 weeks at the earliest.  I am going to start him on steroids.  I will give him a dose of Solu-Medrol in the office 125 mg.  I will then put him on 80 mg daily and then taper down.  I doubt that we will be able to utilize immunotherapy in the near future given the incredibly high liver function studies.  He had absolutely  no problems with his first cycle of treatment.  His LFTs were normal.  To complicate matters, he was in the hospital over the weekend with chest pain.  It did not sound like he had any  cardiac issue.  Of course, no liver function studies were done when he was in the hospital.  He feels well.  His wife says that he is eating good.  He is having no mouth sores.  There is no fevers.  Overall, his performance status is ECOG 1.     Medications:  Current Outpatient Medications:  .  acetaminophen (TYLENOL) 500 MG tablet, Take 1,000 mg by mouth every 4 (four) hours as needed for moderate pain., Disp: , Rfl:  .  aspirin 81 MG tablet, Take 81 mg by mouth daily., Disp: , Rfl:  .  CALCIUM-IRON-VIT D-VIT K PO, Take 1 tablet by mouth 2 (two) times daily., Disp: , Rfl:  .  gabapentin (NEURONTIN) 300 MG capsule, TAKE 1 CAPSULE BY MOUTH TWICE A DAY THEN TAKE 2 CAPSULES AT BEDTIME (Patient taking differently: Take 600 mg by mouth 2 (two) times daily. ), Disp: 120 capsule, Rfl: 4 .  lidocaine-prilocaine (EMLA) cream, Apply to affected area once, Disp: 30 g, Rfl: 3 .  ondansetron (ZOFRAN) 8 MG tablet, Take 1 tablet (8 mg total) by mouth 2 (two) times daily as needed for refractory nausea / vomiting. Start on day 3 after carboplatin chemo., Disp: 30 tablet, Rfl: 1 .  OVER THE COUNTER MEDICATION, every morning.  Juice Plus -- 8 capsule of each the garden, vineyard, and orchead, Disp: , Rfl:  .  ZETIA 10 MG tablet, Take 10 mg by mouth daily. , Disp: , Rfl:  .  glimepiride (AMARYL) 2 MG tablet, TAKE 1 TABLET (2 MG TOTAL) BY MOUTH DAILY WITH BREAKFAST. (Patient not taking: Reported on 03/23/2018), Disp: 30 tablet, Rfl: 4 .  LORazepam (ATIVAN) 0.5 MG tablet, Take 1 tablet (0.5 mg total) by mouth every 6 (six) hours as needed (Nausea or vomiting). (Patient not taking: Reported on 03/26/2018), Disp: 30 tablet, Rfl: 0 .  ondansetron (ZOFRAN) 8 MG tablet, Take 1 tablet (8 mg total) by mouth 2 (two) times daily as needed for nausea or vomiting., Disp: 20 tablet, Rfl: 0 .  prochlorperazine (COMPAZINE) 10 MG tablet, Take 1 tablet (10 mg total) by mouth every 6 (six) hours as needed (Nausea or vomiting). (Patient  not taking: Reported on 03/23/2018), Disp: 30 tablet, Rfl: 1 No current facility-administered medications for this visit.   Facility-Administered Medications Ordered in Other Visits:  .  octreotide (SANDOSTATIN LAR) IM injection 30 mg, 30 mg, Intramuscular, Once, Ennever, Rudell Cobb, MD  Allergies:  Allergies  Allergen Reactions  . Codeine Nausea And Vomiting  . Hydrocodone Nausea And Vomiting    Past Medical History, Surgical history, Social history, and Family History were reviewed and updated.  Review of Systems: Review of Systems  Constitutional: Negative.   HENT: Negative.   Eyes: Negative.   Respiratory: Negative.   Cardiovascular: Negative.   Gastrointestinal: Negative.   Genitourinary: Negative.  Negative for urgency.  Musculoskeletal: Positive for joint pain.  Skin: Negative.   Neurological: Positive for focal weakness.  Endo/Heme/Allergies: Negative.   Psychiatric/Behavioral: Negative.   All other systems reviewed and are negative.    Physical Exam:  height is 5\' 8"  (1.727 m) and weight is 151 lb 1.9 oz (68.5 kg). His oral temperature is 98 F (36.7 C). His blood pressure is 125/77 and his pulse is 95. His respiration is 17 and oxygen saturation is 100%.   Physical Exam Vitals signs reviewed.  HENT:     Head: Normocephalic and atraumatic.  Eyes:     Pupils: Pupils are equal, round, and reactive to light.  Neck:     Musculoskeletal: Normal range of motion.  Cardiovascular:     Rate and Rhythm: Normal rate and regular rhythm.     Heart sounds: Normal heart sounds.  Pulmonary:     Effort: Pulmonary effort is normal.     Breath sounds: Normal breath sounds.  Abdominal:     General: Bowel sounds are normal.     Palpations: Abdomen is soft.  Musculoskeletal: Normal range of motion.        General: No tenderness or deformity.  Lymphadenopathy:     Cervical: No cervical adenopathy.  Skin:    General: Skin is warm and dry.     Findings: No erythema or rash.   Neurological:     Mental Status: He is alert and oriented to person, place, and time.  Psychiatric:        Behavior: Behavior normal.        Thought Content: Thought content normal.        Judgment: Judgment normal.      Lab Results  Component Value Date   WBC 5.3 03/26/2018   HGB 9.3 (L) 03/26/2018   HCT 29.2 (L) 03/26/2018   MCV 97.7 03/26/2018   PLT 279 03/26/2018     Chemistry  Component Value Date/Time   NA 136 03/23/2018 0847   NA 144 12/14/2016 1159   NA 140 10/22/2016 1129   K 4.3 03/23/2018 0847   K 3.5 12/14/2016 1159   K 4.3 10/22/2016 1129   CL 107 03/23/2018 0847   CL 103 12/14/2016 1159   CO2 22 03/23/2018 0847   CO2 30 12/14/2016 1159   CO2 27 10/22/2016 1129   BUN 23 03/23/2018 0847   BUN 16 12/14/2016 1159   BUN 14.0 10/22/2016 1129   CREATININE 1.10 03/23/2018 1847   CREATININE 1.18 02/12/2018 0851   CREATININE 1.3 (H) 12/14/2016 1159   CREATININE 1.1 10/22/2016 1129      Component Value Date/Time   CALCIUM 9.0 03/23/2018 0847   CALCIUM 10.0 12/14/2016 1159   CALCIUM 10.4 10/22/2016 1129   ALKPHOS 75 03/05/2018 0800   ALKPHOS 48 12/14/2016 1159   ALKPHOS 60 10/22/2016 1129   AST 15 03/05/2018 0800   AST 16 02/12/2018 0851   AST 16 10/22/2016 1129   ALT 21 03/05/2018 0800   ALT 9 02/12/2018 0851   ALT 23 12/14/2016 1159   ALT 14 10/22/2016 1129   BILITOT 0.2 (L) 03/05/2018 0800   BILITOT 0.3 02/12/2018 0851   BILITOT 0.31 10/22/2016 1129         Impression and Plan: Mr. Cheramie is 73 year old gentleman with 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  He also has metastatic prostate cancer.  He is castrate resistant.  This, however really is not the problem.  I think we have this under very good control.  Again, our goal now is to get his liver function studies back to normal.  We are going to give him IV fluids in the office today.  He will get a dose of steroid.  I need to make sure that we get him back  in 5 days.  I want to recheck his liver function studies.  I do not expect that we would be able to treat him for a least 2 weeks.  This was incredibly complicated.  This was serious.  I had to spend 45 minutes with he and his wife.  I needed to spend this much time with him as he was in jeopardy of having to be admitted.  I am trying to avoid this.  I went over the prednisone taper with he and his wife.  He will take 80 mg daily for 3 days and then go down to 60 mg daily.  I told him to take the prednisone with food.  I also told him to rinse his mouth out with water and baking soda 4 times a day.  I also told him to take Pepcid twice a day to try to help with gastritis prevention. Marland Kitchen    [name], MD 3/11/202011:49 AM

## 2018-03-26 NOTE — Patient Instructions (Signed)

## 2018-03-26 NOTE — Patient Instructions (Signed)
Dehydration, Adult  Dehydration is when there is not enough fluid or water in your body. This happens when you lose more fluids than you take in. Dehydration can range from mild to very bad. It should be treated right away to keep it from getting very bad. Symptoms of mild dehydration may include:  Thirst.  Dry lips.  Slightly dry mouth.  Dry, warm skin.  Dizziness. Symptoms of moderate dehydration may include:  Very dry mouth.  Muscle cramps.  Dark pee (urine). Pee may be the color of tea.  Your body making less pee.  Your eyes making fewer tears.  Heartbeat that is uneven or faster than normal (palpitations).  Headache.  Light-headedness, especially when you stand up from sitting.  Fainting (syncope). Symptoms of very bad dehydration may include:  Changes in skin, such as: ? Cold and clammy skin. ? Blotchy (mottled) or pale skin. ? Skin that does not quickly return to normal after being lightly pinched and let go (poor skin turgor).  Changes in body fluids, such as: ? Feeling very thirsty. ? Your eyes making fewer tears. ? Not sweating when body temperature is high, such as in hot weather. ? Your body making very little pee.  Changes in vital signs, such as: ? Weak pulse. ? Pulse that is more than 100 beats a minute when you are sitting still. ? Fast breathing. ? Low blood pressure.  Other changes, such as: ? Sunken eyes. ? Cold hands and feet. ? Confusion. ? Lack of energy (lethargy). ? Trouble waking up from sleep. ? Short-term weight loss. ? Unconsciousness. Follow these instructions at home:   If told by your doctor, drink an ORS: ? Make an ORS by using instructions on the package. ? Start by drinking small amounts, about  cup (120 mL) every 5-10 minutes. ? Slowly drink more until you have had the amount that your doctor said to have.  Drink enough clear fluid to keep your pee clear or pale yellow. If you were told to drink an ORS, finish the  ORS first, then start slowly drinking clear fluids. Drink fluids such as: ? Water. Do not drink only water by itself. Doing that can make the salt (sodium) level in your body get too low (hyponatremia). ? Ice chips. ? Fruit juice that you have added water to (diluted). ? Low-calorie sports drinks.  Avoid: ? Alcohol. ? Drinks that have a lot of sugar. These include high-calorie sports drinks, fruit juice that does not have water added, and soda. ? Caffeine. ? Foods that are greasy or have a lot of fat or sugar.  Take over-the-counter and prescription medicines only as told by your doctor.  Do not take salt tablets. Doing that can make the salt level in your body get too high (hypernatremia).  Eat foods that have minerals (electrolytes). Examples include bananas, oranges, potatoes, tomatoes, and spinach.  Keep all follow-up visits as told by your doctor. This is important. Contact a doctor if:  You have belly (abdominal) pain that: ? Gets worse. ? Stays in one area (localizes).  You have a rash.  You have a stiff neck.  You get angry or annoyed more easily than normal (irritability).  You are more sleepy than normal.  You have a harder time waking up than normal.  You feel: ? Weak. ? Dizzy. ? Very thirsty.  You have peed (urinated) only a small amount of very dark pee during 6-8 hours. Get help right away if:  You have   symptoms of very bad dehydration.  You cannot drink fluids without throwing up (vomiting).  Your symptoms get worse with treatment.  You have a fever.  You have a very bad headache.  You are throwing up or having watery poop (diarrhea) and it: ? Gets worse. ? Does not go away.  You have blood or something green (bile) in your throw-up.  You have blood in your poop (stool). This may cause poop to look black and tarry.  You have not peed in 6-8 hours.  You pass out (faint).  Your heart rate when you are sitting still is more than 100 beats a  minute.  You have trouble breathing. This information is not intended to replace advice given to you by your health care provider. Make sure you discuss any questions you have with your health care provider. Document Released: 10/28/2008 Document Revised: 07/22/2015 Document Reviewed: 02/25/2015 Elsevier Interactive Patient Education  2019 Elsevier Inc.  

## 2018-03-27 ENCOUNTER — Other Ambulatory Visit: Payer: Medicare Other

## 2018-03-27 ENCOUNTER — Ambulatory Visit: Payer: Medicare Other

## 2018-03-27 ENCOUNTER — Ambulatory Visit: Payer: Medicare Other | Admitting: Hematology & Oncology

## 2018-03-27 ENCOUNTER — Inpatient Hospital Stay: Payer: Medicare Other

## 2018-03-27 LAB — T4: T4, Total: 6.1 ug/dL (ref 4.5–12.0)

## 2018-03-28 ENCOUNTER — Inpatient Hospital Stay: Payer: Medicare Other

## 2018-03-28 ENCOUNTER — Ambulatory Visit: Payer: Medicare Other

## 2018-03-28 LAB — CHROMOGRANIN A REBASELINE
Chromogranin A (ng/mL): 146.1 ng/mL — ABNORMAL HIGH (ref 0.0–101.8)
Chromogranin A: 3 nmol/L (ref 0–5)

## 2018-03-31 ENCOUNTER — Other Ambulatory Visit: Payer: Self-pay

## 2018-03-31 ENCOUNTER — Inpatient Hospital Stay: Payer: Medicare Other

## 2018-03-31 ENCOUNTER — Ambulatory Visit: Payer: Medicare Other

## 2018-03-31 DIAGNOSIS — Z5189 Encounter for other specified aftercare: Secondary | ICD-10-CM | POA: Diagnosis not present

## 2018-03-31 DIAGNOSIS — C7951 Secondary malignant neoplasm of bone: Secondary | ICD-10-CM | POA: Diagnosis not present

## 2018-03-31 DIAGNOSIS — C7A8 Other malignant neuroendocrine tumors: Secondary | ICD-10-CM

## 2018-03-31 DIAGNOSIS — C61 Malignant neoplasm of prostate: Secondary | ICD-10-CM | POA: Diagnosis not present

## 2018-03-31 DIAGNOSIS — E119 Type 2 diabetes mellitus without complications: Secondary | ICD-10-CM | POA: Diagnosis not present

## 2018-03-31 DIAGNOSIS — Z5111 Encounter for antineoplastic chemotherapy: Secondary | ICD-10-CM | POA: Diagnosis not present

## 2018-03-31 LAB — CMP (CANCER CENTER ONLY)
ALT: 268 U/L — ABNORMAL HIGH (ref 0–44)
AST: 25 U/L (ref 15–41)
Albumin: 4.6 g/dL (ref 3.5–5.0)
Alkaline Phosphatase: 86 U/L (ref 38–126)
Anion gap: 12 (ref 5–15)
BUN: 28 mg/dL — ABNORMAL HIGH (ref 8–23)
CO2: 25 mmol/L (ref 22–32)
Calcium: 10.1 mg/dL (ref 8.9–10.3)
Chloride: 100 mmol/L (ref 98–111)
Creatinine: 1.21 mg/dL (ref 0.61–1.24)
GFR, Est AFR Am: >60 mL/min (ref >60)
GFR, Estimated: >60 mL/min (ref >60)
Glucose, Bld: 114 mg/dL — ABNORMAL HIGH (ref 70–99)
Potassium: 3.6 mmol/L (ref 3.5–5.1)
Sodium: 137 mmol/L (ref 135–145)
Total Bilirubin: 0.3 mg/dL (ref 0.3–1.2)
Total Protein: 7.1 g/dL (ref 6.5–8.1)

## 2018-03-31 LAB — CBC WITH DIFFERENTIAL (CANCER CENTER ONLY)
Abs Immature Granulocytes: 0.19 10*3/uL — ABNORMAL HIGH (ref 0.00–0.07)
Basophils Absolute: 0 10*3/uL (ref 0.0–0.1)
Basophils Relative: 0 %
Eosinophils Absolute: 0 10*3/uL (ref 0.0–0.5)
Eosinophils Relative: 0 %
HCT: 32 % — ABNORMAL LOW (ref 39.0–52.0)
Hemoglobin: 10.4 g/dL — ABNORMAL LOW (ref 13.0–17.0)
Immature Granulocytes: 2 %
Lymphocytes Relative: 13 %
Lymphs Abs: 1.5 10*3/uL (ref 0.7–4.0)
MCH: 31.9 pg (ref 26.0–34.0)
MCHC: 32.5 g/dL (ref 30.0–36.0)
MCV: 98.2 fL (ref 80.0–100.0)
Monocytes Absolute: 1.5 10*3/uL — ABNORMAL HIGH (ref 0.1–1.0)
Monocytes Relative: 13 %
Neutro Abs: 8.2 10*3/uL — ABNORMAL HIGH (ref 1.7–7.7)
Neutrophils Relative %: 72 %
Platelet Count: 421 10*3/uL — ABNORMAL HIGH (ref 150–400)
RBC: 3.26 MIL/uL — ABNORMAL LOW (ref 4.22–5.81)
RDW: 17.3 % — ABNORMAL HIGH (ref 11.5–15.5)
WBC Count: 11.4 10*3/uL — ABNORMAL HIGH (ref 4.0–10.5)
nRBC: 0.2 % (ref 0.0–0.2)

## 2018-03-31 LAB — TSH: TSH: 5.953 u[IU]/mL — ABNORMAL HIGH (ref 0.320–4.118)

## 2018-03-31 NOTE — Patient Instructions (Signed)

## 2018-03-31 NOTE — Progress Notes (Signed)
Labs reviewed by Dr Marin Olp. No new orders at this time. Note to scheduling for appts from 04/16/2018 to be rescheduled to next week 04/07/2018. dph

## 2018-04-01 DIAGNOSIS — R079 Chest pain, unspecified: Secondary | ICD-10-CM | POA: Diagnosis not present

## 2018-04-01 LAB — T4: T4, Total: 5.8 ug/dL (ref 4.5–12.0)

## 2018-04-06 ENCOUNTER — Telehealth: Payer: Self-pay | Admitting: Hematology & Oncology

## 2018-04-06 NOTE — Telephone Encounter (Signed)
Called and pre screened patient for appt 3/23.  He has no complaints

## 2018-04-07 ENCOUNTER — Inpatient Hospital Stay: Payer: Medicare Other

## 2018-04-07 ENCOUNTER — Inpatient Hospital Stay (HOSPITAL_BASED_OUTPATIENT_CLINIC_OR_DEPARTMENT_OTHER): Payer: Medicare Other | Admitting: Hematology & Oncology

## 2018-04-07 ENCOUNTER — Other Ambulatory Visit: Payer: Self-pay

## 2018-04-07 ENCOUNTER — Encounter: Payer: Self-pay | Admitting: Hematology & Oncology

## 2018-04-07 VITALS — BP 121/75 | HR 79 | Temp 98.3°F | Resp 20 | Wt 149.0 lb

## 2018-04-07 DIAGNOSIS — C7951 Secondary malignant neoplasm of bone: Secondary | ICD-10-CM

## 2018-04-07 DIAGNOSIS — Z7984 Long term (current) use of oral hypoglycemic drugs: Secondary | ICD-10-CM | POA: Diagnosis not present

## 2018-04-07 DIAGNOSIS — Z5189 Encounter for other specified aftercare: Secondary | ICD-10-CM | POA: Diagnosis not present

## 2018-04-07 DIAGNOSIS — Z79899 Other long term (current) drug therapy: Secondary | ICD-10-CM | POA: Diagnosis not present

## 2018-04-07 DIAGNOSIS — C7A8 Other malignant neuroendocrine tumors: Secondary | ICD-10-CM

## 2018-04-07 DIAGNOSIS — C61 Malignant neoplasm of prostate: Secondary | ICD-10-CM | POA: Diagnosis not present

## 2018-04-07 DIAGNOSIS — E119 Type 2 diabetes mellitus without complications: Secondary | ICD-10-CM

## 2018-04-07 DIAGNOSIS — Z5111 Encounter for antineoplastic chemotherapy: Secondary | ICD-10-CM | POA: Diagnosis not present

## 2018-04-07 LAB — CBC WITH DIFFERENTIAL/PLATELET
Abs Immature Granulocytes: 0.16 10*3/uL — ABNORMAL HIGH (ref 0.00–0.07)
Basophils Absolute: 0 10*3/uL (ref 0.0–0.1)
Basophils Relative: 0 %
Eosinophils Absolute: 0 10*3/uL (ref 0.0–0.5)
Eosinophils Relative: 0 %
HCT: 33.2 % — ABNORMAL LOW (ref 39.0–52.0)
Hemoglobin: 10.8 g/dL — ABNORMAL LOW (ref 13.0–17.0)
Immature Granulocytes: 1 %
Lymphocytes Relative: 7 %
Lymphs Abs: 0.9 10*3/uL (ref 0.7–4.0)
MCH: 32.3 pg (ref 26.0–34.0)
MCHC: 32.5 g/dL (ref 30.0–36.0)
MCV: 99.4 fL (ref 80.0–100.0)
Monocytes Absolute: 1.1 10*3/uL — ABNORMAL HIGH (ref 0.1–1.0)
Monocytes Relative: 9 %
Neutro Abs: 11.2 10*3/uL — ABNORMAL HIGH (ref 1.7–7.7)
Neutrophils Relative %: 83 %
Platelets: 321 10*3/uL (ref 150–400)
RBC: 3.34 MIL/uL — ABNORMAL LOW (ref 4.22–5.81)
RDW: 17.6 % — ABNORMAL HIGH (ref 11.5–15.5)
WBC: 13.4 10*3/uL — ABNORMAL HIGH (ref 4.0–10.5)
nRBC: 0 % (ref 0.0–0.2)

## 2018-04-07 LAB — COMPREHENSIVE METABOLIC PANEL
ALT: 50 U/L — ABNORMAL HIGH (ref 0–44)
AST: 14 U/L — ABNORMAL LOW (ref 15–41)
Albumin: 4.4 g/dL (ref 3.5–5.0)
Alkaline Phosphatase: 61 U/L (ref 38–126)
Anion gap: 8 (ref 5–15)
BUN: 28 mg/dL — ABNORMAL HIGH (ref 8–23)
CO2: 29 mmol/L (ref 22–32)
Calcium: 9.6 mg/dL (ref 8.9–10.3)
Chloride: 100 mmol/L (ref 98–111)
Creatinine, Ser: 1.1 mg/dL (ref 0.61–1.24)
GFR calc Af Amer: >60 mL/min (ref >60)
GFR calc non Af Amer: >60 mL/min (ref >60)
Glucose, Bld: 84 mg/dL (ref 70–99)
Potassium: 4 mmol/L (ref 3.5–5.1)
Sodium: 137 mmol/L (ref 135–145)
Total Bilirubin: 0.3 mg/dL (ref 0.3–1.2)
Total Protein: 6.5 g/dL (ref 6.5–8.1)

## 2018-04-07 LAB — LACTATE DEHYDROGENASE: LDH: 182 U/L (ref 98–192)

## 2018-04-07 MED ORDER — SODIUM CHLORIDE 0.9 % IV SOLN
Freq: Once | INTRAVENOUS | Status: AC
  Administered 2018-04-07: 12:00:00 via INTRAVENOUS
  Filled 2018-04-07: qty 5

## 2018-04-07 MED ORDER — SODIUM CHLORIDE 0.9 % IV SOLN
Freq: Once | INTRAVENOUS | Status: AC
  Administered 2018-04-07: 11:00:00 via INTRAVENOUS
  Filled 2018-04-07: qty 250

## 2018-04-07 MED ORDER — SODIUM CHLORIDE 0.9% FLUSH
10.0000 mL | INTRAVENOUS | Status: DC | PRN
  Administered 2018-04-07: 10 mL
  Filled 2018-04-07: qty 10

## 2018-04-07 MED ORDER — PALONOSETRON HCL INJECTION 0.25 MG/5ML
INTRAVENOUS | Status: AC
  Filled 2018-04-07: qty 5

## 2018-04-07 MED ORDER — HEPARIN SOD (PORK) LOCK FLUSH 100 UNIT/ML IV SOLN
500.0000 [IU] | Freq: Once | INTRAVENOUS | Status: AC | PRN
  Administered 2018-04-07: 500 [IU]
  Filled 2018-04-07: qty 5

## 2018-04-07 MED ORDER — PALONOSETRON HCL INJECTION 0.25 MG/5ML
0.2500 mg | Freq: Once | INTRAVENOUS | Status: AC
  Administered 2018-04-07: 0.25 mg via INTRAVENOUS

## 2018-04-07 MED ORDER — SODIUM CHLORIDE 0.9 % IV SOLN
80.0000 mg/m2 | Freq: Once | INTRAVENOUS | Status: AC
  Administered 2018-04-07: 140 mg via INTRAVENOUS
  Filled 2018-04-07: qty 7

## 2018-04-07 MED ORDER — SODIUM CHLORIDE 0.9 % IV SOLN
429.5000 mg | Freq: Once | INTRAVENOUS | Status: AC
  Administered 2018-04-07: 430 mg via INTRAVENOUS
  Filled 2018-04-07: qty 43

## 2018-04-07 NOTE — Progress Notes (Signed)
Hematology and Oncology Follow Up Visit  [name] [id number] 1945-09-10 73 y.o. 04/07/2018   Principle Diagnosis:  Metastatic small cell carcinoma --unknown primary  Metastatic Prostate cancer -- castrate sensitive   Current Therapy:    Zytiga 1000 mg by mouth daily - discontinued on 08/15/2016  Xtandi 160 mg po q day - start on 08/15/2016 - discontinued  Xgeva 120 mg subcutaneous Q3 months - due in April 2020  Lupron 22 mg IM every 3 months - due in April 2020  Radium-223 therapy -- s/p cycle #6  Palliative radiation to the right sacrum  Lutathera - s/p cycle 4/4 --last dose on 07/11/2017  Carbo/VP-16/Tecentriq --  S/p cycle #2      Interim History:  [name] is back for followup.  Last time we saw him, his LFTs were incredibly high.  They were probably about 20 times what they should be.  I felt that this was from the [place].  We have had him on steroids.  Is been on prednisone taper.  His liver function studies have come back to normal already.  He feels okay.  He is having no problems with the prednisone.  Surprising, his glucose is not high.  He has had no problems with cough.  He has had no nausea or vomiting.  Has had no diarrhea.  He has had no leg swelling.  We will go ahead and plan to get him started today.    Overall, his performance status is ECOG 1.     Medications:  Current Outpatient Medications:  .  acetaminophen (TYLENOL) 500 MG tablet, Take 1,000 mg by mouth every 4 (four) hours as needed for moderate pain., Disp: , Rfl:  .  aspirin 81 MG tablet, Take 81 mg by mouth daily., Disp: , Rfl:  .  CALCIUM-IRON-VIT D-VIT K PO, Take 1 tablet by mouth 2 (two) times daily., Disp: , Rfl:  .  gabapentin (NEURONTIN) 300 MG capsule, TAKE 1 CAPSULE BY MOUTH TWICE A DAY THEN TAKE 2 CAPSULES AT BEDTIME (Patient taking differently: Take 600 mg by mouth 2 (two) times daily. ), Disp: 120 capsule, Rfl: 4 .  glimepiride (AMARYL) 2 MG tablet, TAKE 1 TABLET (2  MG TOTAL) BY MOUTH DAILY WITH BREAKFAST. (Patient not taking: Reported on 03/23/2018), Disp: 30 tablet, Rfl: 4 .  lidocaine-prilocaine (EMLA) cream, Apply to affected area once, Disp: 30 g, Rfl: 3 .  LORazepam (ATIVAN) 0.5 MG tablet, Take 1 tablet (0.5 mg total) by mouth every 6 (six) hours as needed (Nausea or vomiting). (Patient not taking: Reported on 03/26/2018), Disp: 30 tablet, Rfl: 0 .  ondansetron (ZOFRAN) 8 MG tablet, Take 1 tablet (8 mg total) by mouth 2 (two) times daily as needed for nausea or vomiting., Disp: 20 tablet, Rfl: 0 .  OVER THE COUNTER MEDICATION, every morning. Juice Plus -- 8 capsule of each the garden, vineyard, and orchead, Disp: , Rfl:  .  predniSONE (DELTASONE) 20 MG tablet, Take 4 pills daily with food for 3 days, then 3 pills a day., Disp: 90 tablet, Rfl: 2 .  prochlorperazine (COMPAZINE) 10 MG tablet, Take 1 tablet (10 mg total) by mouth every 6 (six) hours as needed (Nausea or vomiting). (Patient not taking: Reported on 03/23/2018), Disp: 30 tablet, Rfl: 1 .  ZETIA 10 MG tablet, Take 10 mg by mouth daily. , Disp: , Rfl:  No current facility-administered medications for this visit.   Facility-Administered Medications Ordered in Other Visits:  .  octreotide (SANDOSTATIN  LAR) IM injection 30 mg, 30 mg, Intramuscular, Once, Ennever, Rudell Cobb, MD  Allergies:  Allergies  Allergen Reactions  . Codeine Nausea And Vomiting  . Hydrocodone Nausea And Vomiting    Past Medical History, Surgical history, Social history, and Family History were reviewed and updated.  Review of Systems: Review of Systems  Constitutional: Negative.   HENT: Negative.   Eyes: Negative.   Respiratory: Negative.   Cardiovascular: Negative.   Gastrointestinal: Negative.   Genitourinary: Negative.  Negative for urgency.  Musculoskeletal: Positive for joint pain.  Skin: Negative.   Neurological: Positive for focal weakness.  Endo/Heme/Allergies: Negative.   Psychiatric/Behavioral: Negative.    All other systems reviewed and are negative.    Physical Exam:  weight is 149 lb (67.6 kg). His oral temperature is 98.3 F (36.8 C). His blood pressure is 121/75 and his pulse is 79. His respiration is 20 and oxygen saturation is 100%.   Physical Exam Vitals signs reviewed.  HENT:     Head: Normocephalic and atraumatic.  Eyes:     Pupils: Pupils are equal, round, and reactive to light.  Neck:     Musculoskeletal: Normal range of motion.  Cardiovascular:     Rate and Rhythm: Normal rate and regular rhythm.     Heart sounds: Normal heart sounds.  Pulmonary:     Effort: Pulmonary effort is normal.     Breath sounds: Normal breath sounds.  Abdominal:     General: Bowel sounds are normal.     Palpations: Abdomen is soft.  Musculoskeletal: Normal range of motion.        General: No tenderness or deformity.  Lymphadenopathy:     Cervical: No cervical adenopathy.  Skin:    General: Skin is warm and dry.     Findings: No erythema or rash.  Neurological:     Mental Status: He is alert and oriented to person, place, and time.  Psychiatric:        Behavior: Behavior normal.        Thought Content: Thought content normal.        Judgment: Judgment normal.      Lab Results  Component Value Date   WBC 13.4 (H) 04/07/2018   HGB 10.8 (L) 04/07/2018   HCT 33.2 (L) 04/07/2018   MCV 99.4 04/07/2018   PLT 321 04/07/2018     Chemistry      Component Value Date/Time   NA 137 04/07/2018 1027   NA 144 12/14/2016 1159   NA 140 10/22/2016 1129   K 4.0 04/07/2018 1027   K 3.5 12/14/2016 1159   K 4.3 10/22/2016 1129   CL 100 04/07/2018 1027   CL 103 12/14/2016 1159   CO2 29 04/07/2018 1027   CO2 30 12/14/2016 1159   CO2 27 10/22/2016 1129   BUN 28 (H) 04/07/2018 1027   BUN 16 12/14/2016 1159   BUN 14.0 10/22/2016 1129   CREATININE 1.10 04/07/2018 1027   CREATININE 1.21 03/31/2018 0830   CREATININE 1.3 (H) 12/14/2016 1159   CREATININE 1.1 10/22/2016 1129      Component  Value Date/Time   CALCIUM 9.6 04/07/2018 1027   CALCIUM 10.0 12/14/2016 1159   CALCIUM 10.4 10/22/2016 1129   ALKPHOS 61 04/07/2018 1027   ALKPHOS 48 12/14/2016 1159   ALKPHOS 60 10/22/2016 1129   AST 14 (L) 04/07/2018 1027   AST 25 03/31/2018 0830   AST 16 10/22/2016 1129   ALT 50 (H) 04/07/2018 1027   ALT  268 (H) 03/31/2018 0830   ALT 23 12/14/2016 1159   ALT 14 10/22/2016 1129   BILITOT 0.3 04/07/2018 1027   BILITOT 0.3 03/31/2018 0830   BILITOT 0.31 10/22/2016 1129         Impression and Plan: Mr. 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  He also has metastatic prostate cancer.  He is castrate resistant.  This, however really is not the problem.  I think we have this under very good control.  Again, we will proceed with his third cycle of chemotherapy.  We will have to omit the immunotherapy.  I still have hopes that we can use immunotherapy as a maintenance for him.  We may have to try a different immunotherapeutic agent.  After his fourth cycle of chemotherapy, we will plan for another PET scan.  I gave him the taper for prednisone.  He will take 40 mg daily for 3 days followed by 20 mg daily for 3 days followed by 10 mg daily for 3 days and then stop.  He understands this.  [name], MD 3/23/202011:20 AM

## 2018-04-08 ENCOUNTER — Inpatient Hospital Stay: Payer: Medicare Other

## 2018-04-08 ENCOUNTER — Other Ambulatory Visit: Payer: Self-pay

## 2018-04-08 VITALS — BP 131/61 | HR 80 | Temp 98.8°F | Resp 19

## 2018-04-08 DIAGNOSIS — C61 Malignant neoplasm of prostate: Secondary | ICD-10-CM | POA: Diagnosis not present

## 2018-04-08 DIAGNOSIS — Z5189 Encounter for other specified aftercare: Secondary | ICD-10-CM | POA: Diagnosis not present

## 2018-04-08 DIAGNOSIS — C7951 Secondary malignant neoplasm of bone: Secondary | ICD-10-CM

## 2018-04-08 DIAGNOSIS — E119 Type 2 diabetes mellitus without complications: Secondary | ICD-10-CM | POA: Diagnosis not present

## 2018-04-08 DIAGNOSIS — C7A8 Other malignant neuroendocrine tumors: Secondary | ICD-10-CM

## 2018-04-08 DIAGNOSIS — Z5111 Encounter for antineoplastic chemotherapy: Secondary | ICD-10-CM | POA: Diagnosis not present

## 2018-04-08 LAB — T4: T4, Total: 5.6 ug/dL (ref 4.5–12.0)

## 2018-04-08 LAB — TSH: TSH: 2.956 u[IU]/mL (ref 0.320–4.118)

## 2018-04-08 MED ORDER — HEPARIN SOD (PORK) LOCK FLUSH 100 UNIT/ML IV SOLN
500.0000 [IU] | Freq: Once | INTRAVENOUS | Status: AC | PRN
  Administered 2018-04-08: 500 [IU]
  Filled 2018-04-08: qty 5

## 2018-04-08 MED ORDER — DEXAMETHASONE SODIUM PHOSPHATE 10 MG/ML IJ SOLN
INTRAMUSCULAR | Status: AC
  Filled 2018-04-08: qty 1

## 2018-04-08 MED ORDER — SODIUM CHLORIDE 0.9 % IV SOLN
Freq: Once | INTRAVENOUS | Status: AC
  Administered 2018-04-08: 11:00:00 via INTRAVENOUS
  Filled 2018-04-08: qty 250

## 2018-04-08 MED ORDER — DEXAMETHASONE SODIUM PHOSPHATE 10 MG/ML IJ SOLN
10.0000 mg | Freq: Once | INTRAMUSCULAR | Status: AC
  Administered 2018-04-08: 10 mg via INTRAVENOUS

## 2018-04-08 MED ORDER — SODIUM CHLORIDE 0.9% FLUSH
10.0000 mL | INTRAVENOUS | Status: DC | PRN
  Administered 2018-04-08: 10 mL
  Filled 2018-04-08: qty 10

## 2018-04-08 MED ORDER — SODIUM CHLORIDE 0.9 % IV SOLN
80.0000 mg/m2 | Freq: Once | INTRAVENOUS | Status: AC
  Administered 2018-04-08: 140 mg via INTRAVENOUS
  Filled 2018-04-08: qty 7

## 2018-04-08 NOTE — Patient Instructions (Signed)
Etoposide, VP-16 injection What is this medicine? ETOPOSIDE, VP-16 (e toe POE side) is a chemotherapy drug. It is used to treat testicular cancer, lung cancer, and other cancers. This medicine may be used for other purposes; ask your health care provider or pharmacist if you have questions. COMMON BRAND NAME(S): Etopophos, Toposar, VePesid What should I tell my health care provider before I take this medicine? They need to know if you have any of these conditions: -infection -kidney disease -liver disease -low blood counts, like low white cell, platelet, or red cell counts -an unusual or allergic reaction to etoposide, other medicines, foods, dyes, or preservatives -pregnant or trying to get pregnant -breast-feeding How should I use this medicine? This medicine is for infusion into a vein. It is administered in a hospital or clinic by a specially trained health care professional. Talk to your pediatrician regarding the use of this medicine in children. Special care may be needed. Overdosage: If you think you have taken too much of this medicine contact a poison control center or emergency room at once. NOTE: This medicine is only for you. Do not share this medicine with others. What if I miss a dose? It is important not to miss your dose. Call your doctor or health care professional if you are unable to keep an appointment. What may interact with this medicine? -aspirin -certain medications for seizures like carbamazepine, phenobarbital, phenytoin, valproic acid -cyclosporine -levamisole -warfarin This list may not describe all possible interactions. Give your health care provider a list of all the medicines, herbs, non-prescription drugs, or dietary supplements you use. Also tell them if you smoke, drink alcohol, or use illegal drugs. Some items may interact with your medicine. What should I watch for while using this medicine? Visit your doctor for checks on your progress. This drug  may make you feel generally unwell. This is not uncommon, as chemotherapy can affect healthy cells as well as cancer cells. Report any side effects. Continue your course of treatment even though you feel ill unless your doctor tells you to stop. In some cases, you may be given additional medicines to help with side effects. Follow all directions for their use. Call your doctor or health care professional for advice if you get a fever, chills or sore throat, or other symptoms of a cold or flu. Do not treat yourself. This drug decreases your body's ability to fight infections. Try to avoid being around people who are sick. This medicine may increase your risk to bruise or bleed. Call your doctor or health care professional if you notice any unusual bleeding. Talk to your doctor about your risk of cancer. You may be more at risk for certain types of cancers if you take this medicine. Do not become pregnant while taking this medicine or for at least 6 months after stopping it. Women should inform their doctor if they wish to become pregnant or think they might be pregnant. Women of child-bearing potential will need to have a negative pregnancy test before starting this medicine. There is a potential for serious side effects to an unborn child. Talk to your health care professional or pharmacist for more information. Do not breast-feed an infant while taking this medicine. Men must use a latex condom during sexual contact with a woman while taking this medicine and for at least 4 months after stopping it. A latex condom is needed even if you have had a vasectomy. Contact your doctor right away if your partner becomes pregnant. Do   not donate sperm while taking this medicine and for at least 4 months after you stop taking this medicine. Men should inform their doctors if they wish to father a child. This medicine may lower sperm counts. What side effects may I notice from receiving this medicine? Side effects that  you should report to your doctor or health care professional as soon as possible: -allergic reactions like skin rash, itching or hives, swelling of the face, lips, or tongue -low blood counts - this medicine may decrease the number of white blood cells, red blood cells and platelets. You may be at increased risk for infections and bleeding. -signs of infection - fever or chills, cough, sore throat, pain or difficulty passing urine -signs of decreased platelets or bleeding - bruising, pinpoint red spots on the skin, black, tarry stools, blood in the urine -signs of decreased red blood cells - unusually weak or tired, fainting spells, lightheadedness -breathing problems -changes in vision -mouth or throat sores or ulcers -pain, redness, swelling or irritation at the injection site -pain, tingling, numbness in the hands or feet -redness, blistering, peeling or loosening of the skin, including inside the mouth -seizures -vomiting Side effects that usually do not require medical attention (report to your doctor or health care professional if they continue or are bothersome): -diarrhea -hair loss -loss of appetite -nausea -stomach pain This list may not describe all possible side effects. Call your doctor for medical advice about side effects. You may report side effects to FDA at 1-800-FDA-1088. Where should I keep my medicine? This drug is given in a hospital or clinic and will not be stored at home. NOTE: This sheet is a summary. It may not cover all possible information. If you have questions about this medicine, talk to your doctor, pharmacist, or health care provider.  2019 [employer] (2014-12-24 11:53:23)  

## 2018-04-09 ENCOUNTER — Inpatient Hospital Stay: Payer: Medicare Other

## 2018-04-09 VITALS — BP 137/68 | HR 81 | Temp 98.7°F | Resp 18

## 2018-04-09 DIAGNOSIS — C61 Malignant neoplasm of prostate: Secondary | ICD-10-CM | POA: Diagnosis not present

## 2018-04-09 DIAGNOSIS — C7951 Secondary malignant neoplasm of bone: Secondary | ICD-10-CM

## 2018-04-09 DIAGNOSIS — Z5111 Encounter for antineoplastic chemotherapy: Secondary | ICD-10-CM | POA: Diagnosis not present

## 2018-04-09 DIAGNOSIS — E119 Type 2 diabetes mellitus without complications: Secondary | ICD-10-CM | POA: Diagnosis not present

## 2018-04-09 DIAGNOSIS — Z5189 Encounter for other specified aftercare: Secondary | ICD-10-CM | POA: Diagnosis not present

## 2018-04-09 DIAGNOSIS — C7A8 Other malignant neuroendocrine tumors: Secondary | ICD-10-CM | POA: Diagnosis not present

## 2018-04-09 MED ORDER — DEXAMETHASONE SODIUM PHOSPHATE 10 MG/ML IJ SOLN
INTRAMUSCULAR | Status: AC
  Filled 2018-04-09: qty 1

## 2018-04-09 MED ORDER — PEGFILGRASTIM 6 MG/0.6ML ~~LOC~~ PSKT
PREFILLED_SYRINGE | SUBCUTANEOUS | Status: AC
  Filled 2018-04-09: qty 0.6

## 2018-04-09 MED ORDER — PEGFILGRASTIM 6 MG/0.6ML ~~LOC~~ PSKT
6.0000 mg | PREFILLED_SYRINGE | Freq: Once | SUBCUTANEOUS | Status: AC
  Administered 2018-04-09: 6 mg via SUBCUTANEOUS

## 2018-04-09 MED ORDER — ALTEPLASE 2 MG IJ SOLR
2.0000 mg | Freq: Once | INTRAMUSCULAR | Status: DC | PRN
  Filled 2018-04-09: qty 2

## 2018-04-09 MED ORDER — SODIUM CHLORIDE 0.9 % IV SOLN
Freq: Once | INTRAVENOUS | Status: AC
  Administered 2018-04-09: 11:00:00 via INTRAVENOUS
  Filled 2018-04-09: qty 250

## 2018-04-09 MED ORDER — SODIUM CHLORIDE 0.9 % IV SOLN
80.0000 mg/m2 | Freq: Once | INTRAVENOUS | Status: AC
  Administered 2018-04-09: 140 mg via INTRAVENOUS
  Filled 2018-04-09: qty 7

## 2018-04-09 MED ORDER — DEXAMETHASONE SODIUM PHOSPHATE 10 MG/ML IJ SOLN
10.0000 mg | Freq: Once | INTRAMUSCULAR | Status: AC
  Administered 2018-04-09: 10 mg via INTRAVENOUS

## 2018-04-09 MED ORDER — HEPARIN SOD (PORK) LOCK FLUSH 100 UNIT/ML IV SOLN
500.0000 [IU] | Freq: Once | INTRAVENOUS | Status: AC | PRN
  Administered 2018-04-09: 500 [IU]
  Filled 2018-04-09: qty 5

## 2018-04-09 MED ORDER — HOT PACK MISC ONCOLOGY
1.0000 | Freq: Once | Status: DC | PRN
  Filled 2018-04-09: qty 1

## 2018-04-09 MED ORDER — SODIUM CHLORIDE 0.9% FLUSH
10.0000 mL | INTRAVENOUS | Status: DC | PRN
  Administered 2018-04-09: 10 mL
  Filled 2018-04-09: qty 10

## 2018-04-09 MED FILL — ONDANSETRON HCL 8 MG TABLET: 8 | 15 days supply | Qty: 30 | Fill #0

## 2018-04-09 MED FILL — GABAPENTIN 300 MG CAPSULE: 300 | 30 days supply | Qty: 120 | Fill #0

## 2018-04-09 NOTE — Patient Instructions (Signed)
Pegfilgrastim injection  What is this medicine?  PEGFILGRASTIM (PEG fil gra stim) is a long-acting granulocyte colony-stimulating factor that stimulates the growth of neutrophils, a type of white blood cell important in the body's fight against infection. It is used to reduce the incidence of fever and infection in patients with certain types of cancer who are receiving chemotherapy that affects the bone marrow, and to increase survival after being exposed to high doses of radiation.  This medicine may be used for other purposes; ask your health care provider or pharmacist if you have questions.  COMMON BRAND NAME(S): Fulphila, Neulasta, UDENYCA  What should I tell my health care provider before I take this medicine?  They need to know if you have any of these conditions:  -kidney disease  -latex allergy  -ongoing radiation therapy  -sickle cell disease  -skin reactions to acrylic adhesives (On-Body Injector only)  -an unusual or allergic reaction to pegfilgrastim, filgrastim, other medicines, foods, dyes, or preservatives  -pregnant or trying to get pregnant  -breast-feeding  How should I use this medicine?  This medicine is for injection under the skin. If you get this medicine at home, you will be taught how to prepare and give the pre-filled syringe or how to use the On-body Injector. Refer to the patient Instructions for Use for detailed instructions. Use exactly as directed. Tell your healthcare provider immediately if you suspect that the On-body Injector may not have performed as intended or if you suspect the use of the On-body Injector resulted in a missed or partial dose.  It is important that you put your used needles and syringes in a special sharps container. Do not put them in a trash can. If you do not have a sharps container, call your pharmacist or healthcare provider to get one.  Talk to your pediatrician regarding the use of this medicine in children. While this drug may be prescribed for  selected conditions, precautions do apply.  Overdosage: If you think you have taken too much of this medicine contact a poison control center or emergency room at once.  NOTE: This medicine is only for you. Do not share this medicine with others.  What if I miss a dose?  It is important not to miss your dose. Call your doctor or health care professional if you miss your dose. If you miss a dose due to an On-body Injector failure or leakage, a new dose should be administered as soon as possible using a single prefilled syringe for manual use.  What may interact with this medicine?  Interactions have not been studied.  Give your health care provider a list of all the medicines, herbs, non-prescription drugs, or dietary supplements you use. Also tell them if you smoke, drink alcohol, or use illegal drugs. Some items may interact with your medicine.  This list may not describe all possible interactions. Give your health care provider a list of all the medicines, herbs, non-prescription drugs, or dietary supplements you use. Also tell them if you smoke, drink alcohol, or use illegal drugs. Some items may interact with your medicine.  What should I watch for while using this medicine?  You may need blood work done while you are taking this medicine.  If you are going to need a MRI, CT scan, or other procedure, tell your doctor that you are using this medicine (On-Body Injector only).  What side effects may I notice from receiving this medicine?  Side effects that you should report to   your doctor or health care professional as soon as possible:  -allergic reactions like skin rash, itching or hives, swelling of the face, lips, or tongue  -back pain  -dizziness  -fever  -pain, redness, or irritation at site where injected  -pinpoint red spots on the skin  -red or dark-brown urine  -shortness of breath or breathing problems  -stomach or side pain, or pain at the shoulder  -swelling  -tiredness  -trouble passing urine or  change in the amount of urine  Side effects that usually do not require medical attention (report to your doctor or health care professional if they continue or are bothersome):  -bone pain  -muscle pain  This list may not describe all possible side effects. Call your doctor for medical advice about side effects. You may report side effects to FDA at 1-800-FDA-1088.  Where should I keep my medicine?  Keep out of the reach of children.  If you are using this medicine at home, you will be instructed on how to store it. Throw away any unused medicine after the expiration date on the label.  NOTE: This sheet is a summary. It may not cover all possible information. If you have questions about this medicine, talk to your doctor, pharmacist, or health care provider.   2019 Elsevier/Gold Standard (2017-04-08 16:57:08)  Etoposide, VP-16 injection  What is this medicine?  ETOPOSIDE, VP-16 (e toe POE side) is a chemotherapy drug. It is used to treat testicular cancer, lung cancer, and other cancers.  This medicine may be used for other purposes; ask your health care provider or pharmacist if you have questions.  COMMON BRAND NAME(S): Etopophos, Toposar, VePesid  What should I tell my health care provider before I take this medicine?  They need to know if you have any of these conditions:  -infection  -kidney disease  -liver disease  -low blood counts, like low white cell, platelet, or red cell counts  -an unusual or allergic reaction to etoposide, other medicines, foods, dyes, or preservatives  -pregnant or trying to get pregnant  -breast-feeding  How should I use this medicine?  This medicine is for infusion into a vein. It is administered in a hospital or clinic by a specially trained health care professional.  Talk to your pediatrician regarding the use of this medicine in children. Special care may be needed.  Overdosage: If you think you have taken too much of this medicine contact a poison control center or emergency  room at once.  NOTE: This medicine is only for you. Do not share this medicine with others.  What if I miss a dose?  It is important not to miss your dose. Call your doctor or health care professional if you are unable to keep an appointment.  What may interact with this medicine?  -aspirin  -certain medications for seizures like carbamazepine, phenobarbital, phenytoin, valproic acid  -cyclosporine  -levamisole  -warfarin  This list may not describe all possible interactions. Give your health care provider a list of all the medicines, herbs, non-prescription drugs, or dietary supplements you use. Also tell them if you smoke, drink alcohol, or use illegal drugs. Some items may interact with your medicine.  What should I watch for while using this medicine?  Visit your doctor for checks on your progress. This drug may make you feel generally unwell. This is not uncommon, as chemotherapy can affect healthy cells as well as cancer cells. Report any side effects. Continue your course of treatment even   though you feel ill unless your doctor tells you to stop.  In some cases, you may be given additional medicines to help with side effects. Follow all directions for their use.  Call your doctor or health care professional for advice if you get a fever, chills or sore throat, or other symptoms of a cold or flu. Do not treat yourself. This drug decreases your body's ability to fight infections. Try to avoid being around people who are sick.  This medicine may increase your risk to bruise or bleed. Call your doctor or health care professional if you notice any unusual bleeding.  Talk to your doctor about your risk of cancer. You may be more at risk for certain types of cancers if you take this medicine.  Do not become pregnant while taking this medicine or for at least 6 months after stopping it. Women should inform their doctor if they wish to become pregnant or think they might be pregnant. Women of child-bearing potential  will need to have a negative pregnancy test before starting this medicine. There is a potential for serious side effects to an unborn child. Talk to your health care professional or pharmacist for more information. Do not breast-feed an infant while taking this medicine.  Men must use a latex condom during sexual contact with a woman while taking this medicine and for at least 4 months after stopping it. A latex condom is needed even if you have had a vasectomy. Contact your doctor right away if your partner becomes pregnant. Do not donate sperm while taking this medicine and for at least 4 months after you stop taking this medicine. Men should inform their doctors if they wish to father a child. This medicine may lower sperm counts.  What side effects may I notice from receiving this medicine?  Side effects that you should report to your doctor or health care professional as soon as possible:  -allergic reactions like skin rash, itching or hives, swelling of the face, lips, or tongue  -low blood counts - this medicine may decrease the number of white blood cells, red blood cells and platelets. You may be at increased risk for infections and bleeding.  -signs of infection - fever or chills, cough, sore throat, pain or difficulty passing urine  -signs of decreased platelets or bleeding - bruising, pinpoint red spots on the skin, black, tarry stools, blood in the urine  -signs of decreased red blood cells - unusually weak or tired, fainting spells, lightheadedness  -breathing problems  -changes in vision  -mouth or throat sores or ulcers  -pain, redness, swelling or irritation at the injection site  -pain, tingling, numbness in the hands or feet  -redness, blistering, peeling or loosening of the skin, including inside the mouth  -seizures  -vomiting  Side effects that usually do not require medical attention (report to your doctor or health care professional if they continue or are bothersome):  -diarrhea  -hair  loss  -loss of appetite  -nausea  -stomach pain  This list may not describe all possible side effects. Call your doctor for medical advice about side effects. You may report side effects to FDA at 1-800-FDA-1088.  Where should I keep my medicine?  This drug is given in a hospital or clinic and will not be stored at home.  NOTE: This sheet is a summary. It may not cover all possible information. If you have questions about this medicine, talk to your doctor, pharmacist, or health care provider.     2019 Elsevier/Gold Standard (2014-12-24 11:53:23)

## 2018-04-10 ENCOUNTER — Other Ambulatory Visit: Payer: Self-pay | Admitting: Family

## 2018-04-11 ENCOUNTER — Other Ambulatory Visit: Payer: Self-pay | Admitting: Family

## 2018-04-16 ENCOUNTER — Telehealth: Payer: Self-pay | Admitting: Hematology & Oncology

## 2018-04-16 ENCOUNTER — Ambulatory Visit: Payer: Medicare Other

## 2018-04-16 ENCOUNTER — Other Ambulatory Visit: Payer: Medicare Other

## 2018-04-16 ENCOUNTER — Ambulatory Visit: Payer: Medicare Other | Admitting: Hematology & Oncology

## 2018-04-16 NOTE — Telephone Encounter (Signed)
Per pt,  to mail  claim form to [facility] in [place]  P#: [id number] C#: [id number]     COPY SCANNED

## 2018-04-17 ENCOUNTER — Ambulatory Visit: Payer: Medicare Other

## 2018-04-18 ENCOUNTER — Ambulatory Visit: Payer: Medicare Other

## 2018-04-28 ENCOUNTER — Inpatient Hospital Stay: Payer: Medicare Other

## 2018-04-28 ENCOUNTER — Encounter: Payer: Self-pay | Admitting: Hematology & Oncology

## 2018-04-28 ENCOUNTER — Other Ambulatory Visit: Payer: Self-pay

## 2018-04-28 ENCOUNTER — Inpatient Hospital Stay: Payer: Medicare Other | Attending: Hematology & Oncology | Admitting: Hematology & Oncology

## 2018-04-28 VITALS — BP 135/77 | HR 102 | Temp 97.9°F | Resp 19 | Ht 68.0 in | Wt 150.0 lb

## 2018-04-28 VITALS — BP 120/70 | HR 88

## 2018-04-28 DIAGNOSIS — C7989 Secondary malignant neoplasm of other specified sites: Secondary | ICD-10-CM | POA: Diagnosis not present

## 2018-04-28 DIAGNOSIS — Z79890 Hormone replacement therapy (postmenopausal): Secondary | ICD-10-CM | POA: Diagnosis not present

## 2018-04-28 DIAGNOSIS — C61 Malignant neoplasm of prostate: Secondary | ICD-10-CM | POA: Diagnosis not present

## 2018-04-28 DIAGNOSIS — M2550 Pain in unspecified joint: Secondary | ICD-10-CM | POA: Insufficient documentation

## 2018-04-28 DIAGNOSIS — C7A8 Other malignant neuroendocrine tumors: Secondary | ICD-10-CM

## 2018-04-28 DIAGNOSIS — K759 Inflammatory liver disease, unspecified: Secondary | ICD-10-CM | POA: Diagnosis not present

## 2018-04-28 DIAGNOSIS — Z5189 Encounter for other specified aftercare: Secondary | ICD-10-CM | POA: Diagnosis not present

## 2018-04-28 DIAGNOSIS — C7B8 Other secondary neuroendocrine tumors: Secondary | ICD-10-CM | POA: Diagnosis not present

## 2018-04-28 DIAGNOSIS — Z885 Allergy status to narcotic agent status: Secondary | ICD-10-CM | POA: Insufficient documentation

## 2018-04-28 DIAGNOSIS — R937 Abnormal findings on diagnostic imaging of other parts of musculoskeletal system: Secondary | ICD-10-CM | POA: Insufficient documentation

## 2018-04-28 DIAGNOSIS — R61 Generalized hyperhidrosis: Secondary | ICD-10-CM | POA: Insufficient documentation

## 2018-04-28 DIAGNOSIS — Z5111 Encounter for antineoplastic chemotherapy: Secondary | ICD-10-CM | POA: Diagnosis not present

## 2018-04-28 DIAGNOSIS — R29898 Other symptoms and signs involving the musculoskeletal system: Secondary | ICD-10-CM | POA: Insufficient documentation

## 2018-04-28 DIAGNOSIS — C7951 Secondary malignant neoplasm of bone: Secondary | ICD-10-CM

## 2018-04-28 DIAGNOSIS — R232 Flushing: Secondary | ICD-10-CM | POA: Diagnosis not present

## 2018-04-28 DIAGNOSIS — R935 Abnormal findings on diagnostic imaging of other abdominal regions, including retroperitoneum: Secondary | ICD-10-CM | POA: Insufficient documentation

## 2018-04-28 LAB — CMP (CANCER CENTER ONLY)
ALT: 13 U/L (ref 0–44)
AST: 13 U/L — ABNORMAL LOW (ref 15–41)
Albumin: 4 g/dL (ref 3.5–5.0)
Alkaline Phosphatase: 83 U/L (ref 38–126)
Anion gap: 9 (ref 5–15)
BUN: 16 mg/dL (ref 8–23)
CO2: 26 mmol/L (ref 22–32)
Calcium: 9.3 mg/dL (ref 8.9–10.3)
Chloride: 103 mmol/L (ref 98–111)
Creatinine: 1.15 mg/dL (ref 0.61–1.24)
GFR, Est AFR Am: >60 mL/min (ref >60)
GFR, Estimated: >60 mL/min (ref >60)
Glucose, Bld: 137 mg/dL — ABNORMAL HIGH (ref 70–99)
Potassium: 3.8 mmol/L (ref 3.5–5.1)
Sodium: 138 mmol/L (ref 135–145)
Total Bilirubin: 0.2 mg/dL — ABNORMAL LOW (ref 0.3–1.2)
Total Protein: 6.3 g/dL — ABNORMAL LOW (ref 6.5–8.1)

## 2018-04-28 LAB — CBC WITH DIFFERENTIAL (CANCER CENTER ONLY)
Abs Immature Granulocytes: 0.3 10*3/uL — ABNORMAL HIGH (ref 0.00–0.07)
Basophils Absolute: 0 10*3/uL (ref 0.0–0.1)
Basophils Relative: 1 %
Eosinophils Absolute: 0.1 10*3/uL (ref 0.0–0.5)
Eosinophils Relative: 1 %
HCT: 29.7 % — ABNORMAL LOW (ref 39.0–52.0)
Hemoglobin: 9.5 g/dL — ABNORMAL LOW (ref 13.0–17.0)
Immature Granulocytes: 5 %
Lymphocytes Relative: 14 %
Lymphs Abs: 0.8 10*3/uL (ref 0.7–4.0)
MCH: 33.5 pg (ref 26.0–34.0)
MCHC: 32 g/dL (ref 30.0–36.0)
MCV: 104.6 fL — ABNORMAL HIGH (ref 80.0–100.0)
Monocytes Absolute: 0.6 10*3/uL (ref 0.1–1.0)
Monocytes Relative: 10 %
Neutro Abs: 4.4 10*3/uL (ref 1.7–7.7)
Neutrophils Relative %: 69 %
Platelet Count: 150 10*3/uL (ref 150–400)
RBC: 2.84 MIL/uL — ABNORMAL LOW (ref 4.22–5.81)
RDW: 17.2 % — ABNORMAL HIGH (ref 11.5–15.5)
WBC Count: 6.2 10*3/uL (ref 4.0–10.5)
nRBC: 0 % (ref 0.0–0.2)

## 2018-04-28 LAB — LACTATE DEHYDROGENASE: LDH: 208 U/L — ABNORMAL HIGH (ref 98–192)

## 2018-04-28 MED ORDER — SODIUM CHLORIDE 0.9 % IV SOLN
416.0000 mg | Freq: Once | INTRAVENOUS | Status: AC
  Administered 2018-04-28: 420 mg via INTRAVENOUS
  Filled 2018-04-28: qty 42

## 2018-04-28 MED ORDER — SODIUM CHLORIDE 0.9 % IV SOLN
Freq: Once | INTRAVENOUS | Status: AC
  Administered 2018-04-28: 11:00:00 via INTRAVENOUS
  Filled 2018-04-28: qty 5

## 2018-04-28 MED ORDER — SODIUM CHLORIDE 0.9% FLUSH
10.0000 mL | INTRAVENOUS | Status: DC | PRN
  Administered 2018-04-28: 10 mL
  Filled 2018-04-28: qty 10

## 2018-04-28 MED ORDER — PALONOSETRON HCL INJECTION 0.25 MG/5ML
INTRAVENOUS | Status: AC
  Filled 2018-04-28: qty 5

## 2018-04-28 MED ORDER — HEPARIN SOD (PORK) LOCK FLUSH 100 UNIT/ML IV SOLN
500.0000 [IU] | Freq: Once | INTRAVENOUS | Status: AC | PRN
  Administered 2018-04-28: 500 [IU]
  Filled 2018-04-28: qty 5

## 2018-04-28 MED ORDER — LEUPROLIDE ACETATE (3 MONTH) 22.5 MG IM KIT
22.5000 mg | PACK | Freq: Once | INTRAMUSCULAR | Status: DC
  Filled 2018-04-28: qty 22.5

## 2018-04-28 MED ORDER — PALONOSETRON HCL INJECTION 0.25 MG/5ML
0.2500 mg | Freq: Once | INTRAVENOUS | Status: AC
  Administered 2018-04-28: 0.25 mg via INTRAVENOUS

## 2018-04-28 MED ORDER — SILDENAFIL CITRATE 50 MG PO TABS: 50.0000 mg | ORAL_TABLET | Freq: Every day | ORAL | 2 refills | Status: DC | PRN

## 2018-04-28 MED ORDER — SODIUM CHLORIDE 0.9 % IV SOLN
Freq: Once | INTRAVENOUS | Status: AC
  Administered 2018-04-28: 10:00:00 via INTRAVENOUS
  Filled 2018-04-28: qty 250

## 2018-04-28 MED ORDER — SODIUM CHLORIDE 0.9 % IV SOLN
80.0000 mg/m2 | Freq: Once | INTRAVENOUS | Status: AC
  Administered 2018-04-28: 140 mg via INTRAVENOUS
  Filled 2018-04-28: qty 7

## 2018-04-28 NOTE — Patient Instructions (Signed)
Etoposide, VP-16 injection What is this medicine? ETOPOSIDE, VP-16 (e toe POE side) is a chemotherapy drug. It is used to treat testicular cancer, lung cancer, and other cancers. This medicine may be used for other purposes; ask your health care provider or pharmacist if you have questions. COMMON BRAND NAME(S): Etopophos, Toposar, VePesid What should I tell my health care provider before I take this medicine? They need to know if you have any of these conditions: -infection -kidney disease -liver disease -low blood counts, like low white cell, platelet, or red cell counts -an unusual or allergic reaction to etoposide, other medicines, foods, dyes, or preservatives -pregnant or trying to get pregnant -breast-feeding How should I use this medicine? This medicine is for infusion into a vein. It is administered in a hospital or clinic by a specially trained health care professional. Talk to your pediatrician regarding the use of this medicine in children. Special care may be needed. Overdosage: If you think you have taken too much of this medicine contact a poison control center or emergency room at once. NOTE: This medicine is only for you. Do not share this medicine with others. What if I miss a dose? It is important not to miss your dose. Call your doctor or health care professional if you are unable to keep an appointment. What may interact with this medicine? -aspirin -certain medications for seizures like carbamazepine, phenobarbital, phenytoin, valproic acid -cyclosporine -levamisole -warfarin This list may not describe all possible interactions. Give your health care provider a list of all the medicines, herbs, non-prescription drugs, or dietary supplements you use. Also tell them if you smoke, drink alcohol, or use illegal drugs. Some items may interact with your medicine. What should I watch for while using this medicine? Visit your doctor for checks on your progress. This drug  may make you feel generally unwell. This is not uncommon, as chemotherapy can affect healthy cells as well as cancer cells. Report any side effects. Continue your course of treatment even though you feel ill unless your doctor tells you to stop. In some cases, you may be given additional medicines to help with side effects. Follow all directions for their use. Call your doctor or health care professional for advice if you get a fever, chills or sore throat, or other symptoms of a cold or flu. Do not treat yourself. This drug decreases your body's ability to fight infections. Try to avoid being around people who are sick. This medicine may increase your risk to bruise or bleed. Call your doctor or health care professional if you notice any unusual bleeding. Talk to your doctor about your risk of cancer. You may be more at risk for certain types of cancers if you take this medicine. Do not become pregnant while taking this medicine or for at least 6 months after stopping it. Women should inform their doctor if they wish to become pregnant or think they might be pregnant. Women of child-bearing potential will need to have a negative pregnancy test before starting this medicine. There is a potential for serious side effects to an unborn child. Talk to your health care professional or pharmacist for more information. Do not breast-feed an infant while taking this medicine. Men must use a latex condom during sexual contact with a woman while taking this medicine and for at least 4 months after stopping it. A latex condom is needed even if you have had a vasectomy. Contact your doctor right away if your partner becomes pregnant. Do   not donate sperm while taking this medicine and for at least 4 months after you stop taking this medicine. Men should inform their doctors if they wish to father a child. This medicine may lower sperm counts. What side effects may I notice from receiving this medicine? Side effects that  you should report to your doctor or health care professional as soon as possible: -allergic reactions like skin rash, itching or hives, swelling of the face, lips, or tongue -low blood counts - this medicine may decrease the number of white blood cells, red blood cells and platelets. You may be at increased risk for infections and bleeding. -signs of infection - fever or chills, cough, sore throat, pain or difficulty passing urine -signs of decreased platelets or bleeding - bruising, pinpoint red spots on the skin, black, tarry stools, blood in the urine -signs of decreased red blood cells - unusually weak or tired, fainting spells, lightheadedness -breathing problems -changes in vision -mouth or throat sores or ulcers -pain, redness, swelling or irritation at the injection site -pain, tingling, numbness in the hands or feet -redness, blistering, peeling or loosening of the skin, including inside the mouth -seizures -vomiting Side effects that usually do not require medical attention (report to your doctor or health care professional if they continue or are bothersome): -diarrhea -hair loss -loss of appetite -nausea -stomach pain This list may not describe all possible side effects. Call your doctor for medical advice about side effects. You may report side effects to FDA at 1-800-FDA-1088. Where should I keep my medicine? This drug is given in a hospital or clinic and will not be stored at home. NOTE: This sheet is a summary. It may not cover all possible information. If you have questions about this medicine, talk to your doctor, pharmacist, or health care provider.  2019 Elsevier/Gold Standard (2014-12-24 11:53:23) Carboplatin injection What is this medicine? CARBOPLATIN (KAR boe pla tin) is a chemotherapy drug. It targets fast dividing cells, like cancer cells, and causes these cells to die. This medicine is used to treat ovarian cancer and many other cancers. This medicine may be  used for other purposes; ask your health care provider or pharmacist if you have questions. COMMON BRAND NAME(S): Paraplatin What should I tell my health care provider before I take this medicine? They need to know if you have any of these conditions: -blood disorders -hearing problems -kidney disease -recent or ongoing radiation therapy -an unusual or allergic reaction to carboplatin, cisplatin, other chemotherapy, other medicines, foods, dyes, or preservatives -pregnant or trying to get pregnant -breast-feeding How should I use this medicine? This drug is usually given as an infusion into a vein. It is administered in a hospital or clinic by a specially trained health care professional. Talk to your pediatrician regarding the use of this medicine in children. Special care may be needed. Overdosage: If you think you have taken too much of this medicine contact a poison control center or emergency room at once. NOTE: This medicine is only for you. Do not share this medicine with others. What if I miss a dose? It is important not to miss a dose. Call your doctor or health care professional if you are unable to keep an appointment. What may interact with this medicine? -medicines for seizures -medicines to increase blood counts like filgrastim, pegfilgrastim, sargramostim -some antibiotics like amikacin, gentamicin, neomycin, streptomycin, tobramycin -vaccines Talk to your doctor or health care professional before taking any of these medicines: -acetaminophen -aspirin -ibuprofen -ketoprofen -naproxen  This list may not describe all possible interactions. Give your health care provider a list of all the medicines, herbs, non-prescription drugs, or dietary supplements you use. Also tell them if you smoke, drink alcohol, or use illegal drugs. Some items may interact with your medicine. What should I watch for while using this medicine? Your condition will be monitored carefully while you are  receiving this medicine. You will need important blood work done while you are taking this medicine. This drug may make you feel generally unwell. This is not uncommon, as chemotherapy can affect healthy cells as well as cancer cells. Report any side effects. Continue your course of treatment even though you feel ill unless your doctor tells you to stop. In some cases, you may be given additional medicines to help with side effects. Follow all directions for their use. Call your doctor or health care professional for advice if you get a fever, chills or sore throat, or other symptoms of a cold or flu. Do not treat yourself. This drug decreases your body's ability to fight infections. Try to avoid being around people who are sick. This medicine may increase your risk to bruise or bleed. Call your doctor or health care professional if you notice any unusual bleeding. Be careful brushing and flossing your teeth or using a toothpick because you may get an infection or bleed more easily. If you have any dental work done, tell your dentist you are receiving this medicine. Avoid taking products that contain aspirin, acetaminophen, ibuprofen, naproxen, or ketoprofen unless instructed by your doctor. These medicines may hide a fever. Do not become pregnant while taking this medicine. Women should inform their doctor if they wish to become pregnant or think they might be pregnant. There is a potential for serious side effects to an unborn child. Talk to your health care professional or pharmacist for more information. Do not breast-feed an infant while taking this medicine. What side effects may I notice from receiving this medicine? Side effects that you should report to your doctor or health care professional as soon as possible: -allergic reactions like skin rash, itching or hives, swelling of the face, lips, or tongue -signs of infection - fever or chills, cough, sore throat, pain or difficulty passing  urine -signs of decreased platelets or bleeding - bruising, pinpoint red spots on the skin, black, tarry stools, nosebleeds -signs of decreased red blood cells - unusually weak or tired, fainting spells, lightheadedness -breathing problems -changes in hearing -changes in vision -chest pain -high blood pressure -low blood counts - This drug may decrease the number of white blood cells, red blood cells and platelets. You may be at increased risk for infections and bleeding. -nausea and vomiting -pain, swelling, redness or irritation at the injection site -pain, tingling, numbness in the hands or feet -problems with balance, talking, walking -trouble passing urine or change in the amount of urine Side effects that usually do not require medical attention (report to your doctor or health care professional if they continue or are bothersome): -hair loss -loss of appetite -metallic taste in the mouth or changes in taste This list may not describe all possible side effects. Call your doctor for medical advice about side effects. You may report side effects to FDA at 1-800-FDA-1088. Where should I keep my medicine? This drug is given in a hospital or clinic and will not be stored at home. NOTE: This sheet is a summary. It may not cover all possible information. If you have  questions about this medicine, talk to your doctor, pharmacist, or health care provider.  2019 Elsevier/Gold Standard (2007-04-08 14:38:05)

## 2018-04-28 NOTE — Progress Notes (Signed)
OK for patient to receive Lupron at 11 weeks per order of Dr. Marin Olp.

## 2018-04-28 NOTE — Progress Notes (Signed)
Hematology and Oncology Follow Up Visit  [name] [id number] 1945-10-08 73 y.o. 04/28/2018   Principle Diagnosis:  Metastatic small cell carcinoma --unknown primary  Metastatic Prostate cancer -- castrate sensitive   Current Therapy:    Zytiga 1000 mg by mouth daily - discontinued on 08/15/2016  Xtandi 160 mg po q day - start on 08/15/2016 - discontinued  Xgeva 120 mg subcutaneous Q3 months - due in July 2020  Lupron 22 mg IM every 3 months - due in July 2020  Radium-223 therapy -- s/p cycle #6  Palliative radiation to the right sacrum  Lutathera - s/p cycle 4/4 --last dose on 07/11/2017  Carbo/VP-16/Tecentriq --  S/p cycle #3      Interim History:  Mr.  [name] is back for followup.  He actually is doing quite well.  He had a wonderful Easter weekend.  He and his wife were together.  They really cannot have family over.  His liver studies have normalized again.  I am very happy about this.  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  We have stopped this.  He has had no issues with pain.  His blood sugars have been doing pretty well.  He has had no nausea or vomiting.  He has had no diarrhea.  He does have some weakness in the right leg which is chronic.  He does have a leg brace on.  He has had no mouth sores.    Overall, his performance status is ECOG 1.     Medications:  Current Outpatient Medications:  .  acetaminophen (TYLENOL) 500 MG tablet, Take 1,000 mg by mouth every 4 (four) hours as needed for moderate pain., Disp: , Rfl:  .  aspirin 81 MG tablet, Take 81 mg by mouth daily., Disp: , Rfl:  .  CALCIUM-IRON-VIT D-VIT K PO, Take 1 tablet by mouth 2 (two) times daily., Disp: , Rfl:  .  gabapentin (NEURONTIN) 300 MG capsule, TAKE 1 CAPSULE BY MOUTH TWICE A DAY THEN TAKE 2 CAPSULES AT BEDTIME (Patient taking differently: Take 600 mg by mouth 2 (two) times daily. ), Disp: 120 capsule, Rfl: 4 .  glimepiride (AMARYL) 2 MG tablet,  TAKE 1 TABLET (2 MG TOTAL) BY MOUTH DAILY WITH BREAKFAST. (Patient not taking: Reported on 03/23/2018), Disp: 30 tablet, Rfl: 4 .  lidocaine-prilocaine (EMLA) cream, Apply to affected area once, Disp: 30 g, Rfl: 3 .  LORazepam (ATIVAN) 0.5 MG tablet, Take 1 tablet (0.5 mg total) by mouth every 6 (six) hours as needed (Nausea or vomiting). (Patient not taking: Reported on 03/26/2018), Disp: 30 tablet, Rfl: 0 .  ondansetron (ZOFRAN) 8 MG tablet, Take 1 tablet (8 mg total) by mouth 2 (two) times daily as needed for nausea or vomiting., Disp: 20 tablet, Rfl: 0 .  OVER THE COUNTER MEDICATION, every morning. Juice Plus -- 8 capsule of each the garden, vineyard, and orchead, Disp: , Rfl:  .  predniSONE (DELTASONE) 20 MG tablet, Take 4 pills daily with food for 3 days, then 3 pills a day., Disp: 90 tablet, Rfl: 2 .  prochlorperazine (COMPAZINE) 10 MG tablet, Take 1 tablet (10 mg total) by mouth every 6 (six) hours as needed (Nausea or vomiting). (Patient not taking: Reported on 03/23/2018), Disp: 30 tablet, Rfl: 1 .  ZETIA 10 MG tablet, Take 10 mg by mouth daily. , Disp: , Rfl:  No current facility-administered medications for this visit.   Facility-Administered Medications Ordered in Other Visits:  .  octreotide (SANDOSTATIN LAR) IM injection 30 mg, 30 mg, Intramuscular, Once, [place] Souter, Rudell Cobb, MD  Allergies:  Allergies  Allergen Reactions  . Codeine Nausea And Vomiting  . Hydrocodone Nausea And Vomiting    Past Medical History, Surgical history, Social history, and Family History were reviewed and updated.  Review of Systems: Review of Systems  Constitutional: Negative.   HENT: Negative.   Eyes: Negative.   Respiratory: Negative.   Cardiovascular: Negative.   Gastrointestinal: Negative.   Genitourinary: Negative.  Negative for urgency.  Musculoskeletal: Positive for joint pain.  Skin: Negative.   Neurological: Positive for focal weakness.  Endo/Heme/Allergies: Negative.    Psychiatric/Behavioral: Negative.   All other systems reviewed and are negative.    Physical Exam:  vitals were not taken for this visit.   Physical Exam Vitals signs reviewed.  HENT:     Head: Normocephalic and atraumatic.  Eyes:     Pupils: Pupils are equal, round, and reactive to light.  Neck:     Musculoskeletal: Normal range of motion.  Cardiovascular:     Rate and Rhythm: Normal rate and regular rhythm.     Heart sounds: Normal heart sounds.  Pulmonary:     Effort: Pulmonary effort is normal.     Breath sounds: Normal breath sounds.  Abdominal:     General: Bowel sounds are normal.     Palpations: Abdomen is soft.  Musculoskeletal: Normal range of motion.        General: No tenderness or deformity.  Lymphadenopathy:     Cervical: No cervical adenopathy.  Skin:    General: Skin is warm and dry.     Findings: No erythema or rash.  Neurological:     Mental Status: He is alert and oriented to person, place, and time.  Psychiatric:        Behavior: Behavior normal.        Thought Content: Thought content normal.        Judgment: Judgment normal.      Lab Results  Component Value Date   WBC 6.2 04/28/2018   HGB 9.5 (L) 04/28/2018   HCT 29.7 (L) 04/28/2018   MCV 104.6 (H) 04/28/2018   PLT 150 04/28/2018     Chemistry      Component Value Date/Time   NA 137 04/07/2018 1027   NA 144 12/14/2016 1159   NA 140 10/22/2016 1129   K 4.0 04/07/2018 1027   K 3.5 12/14/2016 1159   K 4.3 10/22/2016 1129   CL 100 04/07/2018 1027   CL 103 12/14/2016 1159   CO2 29 04/07/2018 1027   CO2 30 12/14/2016 1159   CO2 27 10/22/2016 1129   BUN 28 (H) 04/07/2018 1027   BUN 16 12/14/2016 1159   BUN 14.0 10/22/2016 1129   CREATININE 1.10 04/07/2018 1027   CREATININE 1.21 03/31/2018 0830   CREATININE 1.3 (H) 12/14/2016 1159   CREATININE 1.1 10/22/2016 1129      Component Value Date/Time   CALCIUM 9.6 04/07/2018 1027   CALCIUM 10.0 12/14/2016 1159   CALCIUM 10.4  10/22/2016 1129   ALKPHOS 61 04/07/2018 1027   ALKPHOS 48 12/14/2016 1159   ALKPHOS 60 10/22/2016 1129   AST 14 (L) 04/07/2018 1027   AST 25 03/31/2018 0830   AST 16 10/22/2016 1129   ALT 50 (H) 04/07/2018 1027   ALT 268 (H) 03/31/2018 0830   ALT 23 12/14/2016 1159   ALT 14 10/22/2016 1129   BILITOT 0.3 04/07/2018 1027  BILITOT 0.3 03/31/2018 0830   BILITOT 0.31 10/22/2016 1129         Impression and Plan: Mr. 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  He also has metastatic prostate cancer.  He is castrate resistant.  This, however really is not the problem.  I think we have this under very good control.  I will proceed with his fourth cycle of treatment today.  As far as the prostate cancer goes, he will get his Lupron and Xgeva today.  After this fourth cycle of carboplatin/VP-16, I will repeat his PET scan and we will see how things look.  At some point, we will have to think about maintenance therapy for him.  I still think that we could give immunotherapy to try even though with Tecentriq he has the markedly elevated liver function studies.  I will see him back in about 1 month.  I will do the PET scan in 3 weeks.      [name], MD 4/13/20209:26 AM

## 2018-04-28 NOTE — Patient Instructions (Signed)

## 2018-04-29 ENCOUNTER — Other Ambulatory Visit: Payer: Self-pay

## 2018-04-29 ENCOUNTER — Inpatient Hospital Stay: Payer: Medicare Other

## 2018-04-29 VITALS — BP 103/67 | HR 94 | Temp 97.8°F

## 2018-04-29 DIAGNOSIS — Z5189 Encounter for other specified aftercare: Secondary | ICD-10-CM | POA: Diagnosis not present

## 2018-04-29 DIAGNOSIS — Z5111 Encounter for antineoplastic chemotherapy: Secondary | ICD-10-CM | POA: Diagnosis not present

## 2018-04-29 DIAGNOSIS — C7B8 Other secondary neuroendocrine tumors: Secondary | ICD-10-CM | POA: Diagnosis not present

## 2018-04-29 DIAGNOSIS — C61 Malignant neoplasm of prostate: Secondary | ICD-10-CM

## 2018-04-29 DIAGNOSIS — C7A8 Other malignant neuroendocrine tumors: Secondary | ICD-10-CM

## 2018-04-29 DIAGNOSIS — C7951 Secondary malignant neoplasm of bone: Secondary | ICD-10-CM

## 2018-04-29 DIAGNOSIS — M2550 Pain in unspecified joint: Secondary | ICD-10-CM | POA: Diagnosis not present

## 2018-04-29 LAB — PROSTATE-SPECIFIC AG, SERUM (LABCORP): Prostate Specific Ag, Serum: <0.1 ng/mL (ref 0.0–4.0)

## 2018-04-29 LAB — TESTOSTERONE: Testosterone: <3 ng/dL — ABNORMAL LOW (ref 264–916)

## 2018-04-29 MED ORDER — SODIUM CHLORIDE 0.9 % IV SOLN
80.0000 mg/m2 | Freq: Once | INTRAVENOUS | Status: AC
  Administered 2018-04-29: 140 mg via INTRAVENOUS
  Filled 2018-04-29: qty 7

## 2018-04-29 MED ORDER — DEXAMETHASONE SODIUM PHOSPHATE 10 MG/ML IJ SOLN
10.0000 mg | Freq: Once | INTRAMUSCULAR | Status: AC
  Administered 2018-04-29: 10 mg via INTRAVENOUS

## 2018-04-29 MED ORDER — ALBUTEROL SULFATE (2.5 MG/3ML) 0.083% IN NEBU
2.5000 mg | INHALATION_SOLUTION | Freq: Once | RESPIRATORY_TRACT | Status: DC | PRN
  Filled 2018-04-29: qty 3

## 2018-04-29 MED ORDER — EPINEPHRINE HCL 0.1 MG/ML IJ SOLN: 0.2500 mg | Freq: Once | INTRAMUSCULAR | Status: DC | PRN

## 2018-04-29 MED ORDER — DEXAMETHASONE SODIUM PHOSPHATE 10 MG/ML IJ SOLN
INTRAMUSCULAR | Status: AC
  Filled 2018-04-29: qty 1

## 2018-04-29 MED ORDER — SODIUM CHLORIDE 0.9 % IV SOLN
Freq: Once | INTRAVENOUS | Status: DC | PRN
  Filled 2018-04-29: qty 250

## 2018-04-29 MED ORDER — DIPHENHYDRAMINE HCL 50 MG/ML IJ SOLN: 50.0000 mg | Freq: Once | INTRAMUSCULAR | Status: DC | PRN

## 2018-04-29 MED ORDER — DIPHENHYDRAMINE HCL 50 MG/ML IJ SOLN: 25.0000 mg | Freq: Once | INTRAMUSCULAR | Status: DC | PRN

## 2018-04-29 MED ORDER — LEUPROLIDE ACETATE (3 MONTH) 22.5 MG IM KIT
22.5000 mg | PACK | Freq: Once | INTRAMUSCULAR | Status: AC
  Administered 2018-04-29: 12:00:00 22.5 mg via INTRAMUSCULAR
  Filled 2018-04-29: qty 22.5

## 2018-04-29 MED ORDER — DENOSUMAB 120 MG/1.7ML ~~LOC~~ SOLN
120.0000 mg | Freq: Once | SUBCUTANEOUS | Status: AC
  Administered 2018-04-29: 120 mg via SUBCUTANEOUS

## 2018-04-29 MED ORDER — SODIUM CHLORIDE 0.9 % IV SOLN
Freq: Once | INTRAVENOUS | Status: AC
  Administered 2018-04-29: 09:00:00 via INTRAVENOUS
  Filled 2018-04-29: qty 250

## 2018-04-29 MED ORDER — SODIUM CHLORIDE 0.9% FLUSH
10.0000 mL | INTRAVENOUS | Status: DC | PRN
  Administered 2018-04-29 (×2): 10 mL
  Filled 2018-04-29: qty 10

## 2018-04-29 MED ORDER — HEPARIN SOD (PORK) LOCK FLUSH 100 UNIT/ML IV SOLN
250.0000 [IU] | Freq: Once | INTRAVENOUS | Status: DC | PRN
  Filled 2018-04-29: qty 5

## 2018-04-29 MED ORDER — DENOSUMAB 120 MG/1.7ML ~~LOC~~ SOLN
SUBCUTANEOUS | Status: AC
  Filled 2018-04-29: qty 1.7

## 2018-04-29 MED ORDER — HEPARIN SOD (PORK) LOCK FLUSH 100 UNIT/ML IV SOLN
500.0000 [IU] | Freq: Once | INTRAVENOUS | Status: DC | PRN
  Filled 2018-04-29: qty 5

## 2018-04-29 MED ORDER — METHYLPREDNISOLONE SODIUM SUCC 125 MG IJ SOLR: 125.0000 mg | Freq: Once | INTRAMUSCULAR | Status: DC | PRN

## 2018-04-29 NOTE — Patient Instructions (Signed)
[facility] Discharge Instructions for Patients Receiving Chemotherapy  Today you received the following chemotherapy agents vepesid.  To help prevent nausea and vomiting after your treatment, we encourage you to take your nausea medication as indicated by your MD.   If you develop nausea and vomiting that is not controlled by your nausea medication, call the clinic.   BELOW ARE SYMPTOMS THAT SHOULD BE REPORTED IMMEDIATELY:  *FEVER GREATER THAN 100.5 F  *CHILLS WITH OR WITHOUT FEVER  NAUSEA AND VOMITING THAT IS NOT CONTROLLED WITH YOUR NAUSEA MEDICATION  *UNUSUAL SHORTNESS OF BREATH  *UNUSUAL BRUISING OR BLEEDING  TENDERNESS IN MOUTH AND THROAT WITH OR WITHOUT PRESENCE OF ULCERS  *URINARY PROBLEMS  *BOWEL PROBLEMS  UNUSUAL RASH Items with * indicate a potential emergency and should be followed up as soon as possible.  Feel free to call the clinic should you have any questions or concerns. The clinic phone number is [phone].  Please show the [facility] at check-in to the Emergency Department and triage nurse.

## 2018-04-29 NOTE — Progress Notes (Signed)
Pt. noticed "red blotches" on his R leg yesterday and he would like for "someone to take a look at it". Laverna Peace, NP notified. Patient's Vepesid infusion ended at 11:15. Rates changes shown on MAR are from the following patient using the pump.

## 2018-04-30 ENCOUNTER — Inpatient Hospital Stay: Payer: Medicare Other

## 2018-04-30 VITALS — BP 123/64 | HR 88 | Temp 98.3°F | Resp 18

## 2018-04-30 DIAGNOSIS — C7A8 Other malignant neuroendocrine tumors: Secondary | ICD-10-CM | POA: Diagnosis not present

## 2018-04-30 DIAGNOSIS — Z5189 Encounter for other specified aftercare: Secondary | ICD-10-CM | POA: Diagnosis not present

## 2018-04-30 DIAGNOSIS — M2550 Pain in unspecified joint: Secondary | ICD-10-CM | POA: Diagnosis not present

## 2018-04-30 DIAGNOSIS — C7B8 Other secondary neuroendocrine tumors: Secondary | ICD-10-CM | POA: Diagnosis not present

## 2018-04-30 DIAGNOSIS — C61 Malignant neoplasm of prostate: Secondary | ICD-10-CM | POA: Diagnosis not present

## 2018-04-30 DIAGNOSIS — Z5111 Encounter for antineoplastic chemotherapy: Secondary | ICD-10-CM | POA: Diagnosis not present

## 2018-04-30 DIAGNOSIS — C7951 Secondary malignant neoplasm of bone: Secondary | ICD-10-CM

## 2018-04-30 LAB — CHROMOGRANIN A: Chromogranin A (ng/mL): 200.3 ng/mL — ABNORMAL HIGH (ref 0.0–101.8)

## 2018-04-30 MED ORDER — PEGFILGRASTIM 6 MG/0.6ML ~~LOC~~ PSKT
6.0000 mg | PREFILLED_SYRINGE | Freq: Once | SUBCUTANEOUS | Status: AC
  Administered 2018-04-30: 6 mg via SUBCUTANEOUS

## 2018-04-30 MED ORDER — SODIUM CHLORIDE 0.9 % IV SOLN
Freq: Once | INTRAVENOUS | Status: AC
  Administered 2018-04-30: 09:00:00 via INTRAVENOUS
  Filled 2018-04-30: qty 250

## 2018-04-30 MED ORDER — SODIUM CHLORIDE 0.9% FLUSH
10.0000 mL | INTRAVENOUS | Status: DC | PRN
  Administered 2018-04-30 (×2): 10 mL
  Filled 2018-04-30: qty 10

## 2018-04-30 MED ORDER — SODIUM CHLORIDE 0.9 % IV SOLN
80.0000 mg/m2 | Freq: Once | INTRAVENOUS | Status: AC
  Administered 2018-04-30: 140 mg via INTRAVENOUS
  Filled 2018-04-30: qty 7

## 2018-04-30 MED ORDER — PEGFILGRASTIM 6 MG/0.6ML ~~LOC~~ PSKT
PREFILLED_SYRINGE | SUBCUTANEOUS | Status: AC
  Filled 2018-04-30: qty 0.6

## 2018-04-30 MED ORDER — HEPARIN SOD (PORK) LOCK FLUSH 100 UNIT/ML IV SOLN
500.0000 [IU] | Freq: Once | INTRAVENOUS | Status: AC | PRN
  Administered 2018-04-30: 500 [IU]
  Filled 2018-04-30: qty 5

## 2018-04-30 MED ORDER — DEXAMETHASONE SODIUM PHOSPHATE 10 MG/ML IJ SOLN: 10.0000 mg | Freq: Once | INTRAMUSCULAR | Status: DC

## 2018-04-30 NOTE — Patient Instructions (Signed)
Etoposide, VP-16 capsules What is this medicine? ETOPOSIDE, VP-16 (e toe POE side) is a chemotherapy drug. It is used to treat small cell lung cancer and other cancers. This medicine may be used for other purposes; ask your health care provider or pharmacist if you have questions. COMMON BRAND NAME(S): VePesid What should I tell my health care provider before I take this medicine? They need to know if you have any of these conditions: -infection -kidney disease -liver disease -low blood counts, like low white cell, platelet, or red cell counts -an unusual or allergic reaction to etoposide, other medicines, foods, dyes, or preservatives -pregnant or trying to get pregnant -breast-feeding How should I use this medicine? Take this medicine by mouth with a glass of water. Follow the directions on the prescription label. Do not open, crush, or chew the capsules. It is advisable to wear gloves when handling this medicine. Take your medicine at regular intervals. Do not take it more often than directed. Do not stop taking except on your doctor's advice. Talk to your pediatrician regarding the use of this medicine in children. Special care may be needed. Overdosage: If you think you have taken too much of this medicine contact a poison control center or emergency room at once. NOTE: This medicine is only for you. Do not share this medicine with others. What if I miss a dose? If you miss a dose, take it as soon as you can. If it is almost time for your next dose, take only that dose. Do not take double or extra doses. What may interact with this medicine? -aspirin -certain medications for seizures like carbamazepine, phenobarbital, phenytoin, valproic acid -cyclosporine -levamisole -valproic acid -warfarin This list may not describe all possible interactions. Give your health care provider a list of all the medicines, herbs, non-prescription drugs, or dietary supplements you use. Also tell them if  you smoke, drink alcohol, or use illegal drugs. Some items may interact with your medicine. What should I watch for while using this medicine? Visit your doctor for checks on your progress. This drug may make you feel generally unwell. This is not uncommon, as chemotherapy can affect healthy cells as well as cancer cells. Report any side effects. Continue your course of treatment even though you feel ill unless your doctor tells you to stop. In some cases, you may be given additional medicines to help with side effects. Follow all directions for their use. Call your doctor or health care professional for advice if you get a fever, chills or sore throat, or other symptoms of a cold or flu. Do not treat yourself. This drug decreases your body's ability to fight infections. Try to avoid being around people who are sick. This medicine may increase your risk to bruise or bleed. Call your doctor or health care professional if you notice any unusual bleeding. Talk to your doctor about your risk of cancer. You may be more at risk for certain types of cancers if you take this medicine. Do not become pregnant while taking this medicine or for at least 6 months after stopping it. Women should inform their doctor if they wish to become pregnant or think they might be pregnant. Women of child-bearing potential will need to have a negative pregnancy test before starting this medicine. There is a potential for serious side effects to an unborn child. Talk to your health care professional or pharmacist for more information. Do not breast-feed an infant while taking this medicine. Men must use a   latex condom during sexual contact with a woman while taking this medicine and for at least 4 months after stopping it. A latex condom is needed even if you have had a vasectomy. Contact your doctor right away if your partner becomes pregnant. Do not donate sperm while taking this medicine and for 4 months after you stop taking this  medicine. Men should inform their doctors if they wish to father a child. This medicine may lower sperm counts. What side effects may I notice from receiving this medicine? Side effects that you should report to your doctor or health care professional as soon as possible: -allergic reactions like skin rash, itching or hives, swelling of the face, lips, or tongue -low blood counts - this medicine may decrease the number of white blood cells, red blood cells and platelets. You may be at increased risk for infections and bleeding. -signs of infection - fever or chills, cough, sore throat, pain or difficulty passing urine -signs of decreased platelets or bleeding - bruising, pinpoint red spots on the skin, black, tarry stools, blood in the urine -signs of decreased red blood cells - unusually weak or tired, fainting spells, lightheadedness -breathing problems -changes in vision -mouth or throat sores or ulcers -pain, tingling, numbness in the hands or feet -redness, blistering, peeling or loosening of the skin, including inside the mouth -seizures -vomiting Side effects that usually do not require medical attention (report to your doctor or health care professional if they continue or are bothersome): -change in taste -diarrhea -hair loss -nausea -stomach pain This list may not describe all possible side effects. Call your doctor for medical advice about side effects. You may report side effects to FDA at 1-800-FDA-1088. Where should I keep my medicine? Keep out of the reach of children. Store in a refrigerator between 2 and 8 degrees C (36 and 46 degrees F). Do not freeze. Throw away any unused medicine after the expiration date. NOTE: This sheet is a summary. It may not cover all possible information. If you have questions about this medicine, talk to your doctor, pharmacist, or health care provider.  2019 [employer]vier/Gold Standard (2014-12-24 11:49:52)

## 2018-05-05 ENCOUNTER — Other Ambulatory Visit: Payer: Self-pay | Admitting: *Deleted

## 2018-05-05 ENCOUNTER — Other Ambulatory Visit: Payer: Self-pay

## 2018-05-05 ENCOUNTER — Inpatient Hospital Stay: Payer: Medicare Other

## 2018-05-05 ENCOUNTER — Inpatient Hospital Stay (HOSPITAL_BASED_OUTPATIENT_CLINIC_OR_DEPARTMENT_OTHER): Payer: Medicare Other | Admitting: Hematology & Oncology

## 2018-05-05 ENCOUNTER — Telehealth: Payer: Self-pay | Admitting: *Deleted

## 2018-05-05 ENCOUNTER — Telehealth: Payer: Self-pay | Admitting: Emergency Medicine

## 2018-05-05 VITALS — BP 117/73 | HR 94 | Temp 97.8°F | Resp 18 | Wt 151.0 lb

## 2018-05-05 DIAGNOSIS — C7A8 Other malignant neuroendocrine tumors: Secondary | ICD-10-CM

## 2018-05-05 DIAGNOSIS — Z79890 Hormone replacement therapy (postmenopausal): Secondary | ICD-10-CM

## 2018-05-05 DIAGNOSIS — C61 Malignant neoplasm of prostate: Secondary | ICD-10-CM

## 2018-05-05 DIAGNOSIS — R232 Flushing: Secondary | ICD-10-CM | POA: Diagnosis not present

## 2018-05-05 DIAGNOSIS — R61 Generalized hyperhidrosis: Secondary | ICD-10-CM | POA: Diagnosis not present

## 2018-05-05 DIAGNOSIS — R29898 Other symptoms and signs involving the musculoskeletal system: Secondary | ICD-10-CM

## 2018-05-05 DIAGNOSIS — C7B8 Other secondary neuroendocrine tumors: Secondary | ICD-10-CM | POA: Diagnosis not present

## 2018-05-05 DIAGNOSIS — C7951 Secondary malignant neoplasm of bone: Secondary | ICD-10-CM

## 2018-05-05 DIAGNOSIS — C7989 Secondary malignant neoplasm of other specified sites: Secondary | ICD-10-CM | POA: Diagnosis not present

## 2018-05-05 DIAGNOSIS — Z5189 Encounter for other specified aftercare: Secondary | ICD-10-CM | POA: Diagnosis not present

## 2018-05-05 DIAGNOSIS — M2550 Pain in unspecified joint: Secondary | ICD-10-CM | POA: Diagnosis not present

## 2018-05-05 DIAGNOSIS — Z5111 Encounter for antineoplastic chemotherapy: Secondary | ICD-10-CM | POA: Diagnosis not present

## 2018-05-05 LAB — CMP (CANCER CENTER ONLY)
ALT: 13 U/L (ref 0–44)
AST: 13 U/L — ABNORMAL LOW (ref 15–41)
Albumin: 4.3 g/dL (ref 3.5–5.0)
Alkaline Phosphatase: 96 U/L (ref 38–126)
Anion gap: 10 (ref 5–15)
BUN: 22 mg/dL (ref 8–23)
CO2: 29 mmol/L (ref 22–32)
Calcium: 10 mg/dL (ref 8.9–10.3)
Chloride: 98 mmol/L (ref 98–111)
Creatinine: 1.08 mg/dL (ref 0.61–1.24)
GFR, Est AFR Am: >60 mL/min (ref >60)
GFR, Estimated: >60 mL/min (ref >60)
Glucose, Bld: 92 mg/dL (ref 70–99)
Potassium: 4 mmol/L (ref 3.5–5.1)
Sodium: 137 mmol/L (ref 135–145)
Total Bilirubin: 0.4 mg/dL (ref 0.3–1.2)
Total Protein: 6.8 g/dL (ref 6.5–8.1)

## 2018-05-05 LAB — LACTATE DEHYDROGENASE: LDH: 265 U/L — ABNORMAL HIGH (ref 98–192)

## 2018-05-05 LAB — CBC WITH DIFFERENTIAL (CANCER CENTER ONLY)
Abs Immature Granulocytes: 0.06 10*3/uL (ref 0.00–0.07)
Basophils Absolute: 0 10*3/uL (ref 0.0–0.1)
Basophils Relative: 0 %
Eosinophils Absolute: 0 10*3/uL (ref 0.0–0.5)
Eosinophils Relative: 0 %
HCT: 28.4 % — ABNORMAL LOW (ref 39.0–52.0)
Hemoglobin: 9 g/dL — ABNORMAL LOW (ref 13.0–17.0)
Immature Granulocytes: 1 %
Lymphocytes Relative: 11 %
Lymphs Abs: 0.8 10*3/uL (ref 0.7–4.0)
MCH: 33.6 pg (ref 26.0–34.0)
MCHC: 31.7 g/dL (ref 30.0–36.0)
MCV: 106 fL — ABNORMAL HIGH (ref 80.0–100.0)
Monocytes Absolute: 0.5 10*3/uL (ref 0.1–1.0)
Monocytes Relative: 7 %
Neutro Abs: 6.3 10*3/uL (ref 1.7–7.7)
Neutrophils Relative %: 81 %
Platelet Count: 183 10*3/uL (ref 150–400)
RBC: 2.68 MIL/uL — ABNORMAL LOW (ref 4.22–5.81)
RDW: 16.8 % — ABNORMAL HIGH (ref 11.5–15.5)
WBC Count: 7.7 10*3/uL (ref 4.0–10.5)
nRBC: 0 % (ref 0.0–0.2)

## 2018-05-05 NOTE — Telephone Encounter (Signed)
Left message for patient to return call regarding visit next week. Patient will need to be informed of televisit and needs consent.

## 2018-05-05 NOTE — Telephone Encounter (Signed)
Returned patient's phone call regarding bruising and chemo treatments last week. Patient stated,"I started having night sweats since, last Thursday. They are so bad that I have to put towels on top of the sheets. Also, I have a bruise on the right side of my abdomen, about two to three inches from my belly button. The other shot went in my hip." Per Dr. Marin Olp, bring him in today for labs and MD visit. Patient verbalized understanding. LOS sent to scheduling.

## 2018-05-05 NOTE — Progress Notes (Signed)
Hematology and Oncology Follow Up Visit  [name] [id number] Apr 25, 1945 73 y.o. 05/05/2018   Principle Diagnosis:  Metastatic small cell carcinoma --unknown primary  Metastatic Prostate cancer -- castrate sensitive   Current Therapy:    Zytiga 1000 mg by mouth daily - discontinued on 08/15/2016  Xtandi 160 mg po q day - start on 08/15/2016 - discontinued  Xgeva 120 mg subcutaneous Q3 months - due in July 2020  Lupron 22 mg IM every 3 months - due in July 2020  Radium-223 therapy -- s/p cycle #6  Palliative radiation to the right sacrum  Lutathera - s/p cycle 4/4 --last dose on 07/11/2017  Carbo/VP-16/Tecentriq --  S/p cycle #3      Interim History:  Mr.  [name] is back for for an early follow-up.  He apparently called stating that he was having a lot of hot flashes and night sweats.  When I talked him today, this is not new.  He has had this for quite a while.  He had his chemotherapy last week.  He says that he has some bruising on his abdomen.  This might be where he received his Lupron injection.  He clearly is "menopausal".  His testosterone level has been less than 3.  I am sure this probably accounts for his night sweats.  His blood sugars have been doing okay.  They have not been on the low side.  1 issue that we found is that he apparently was scheduled for his PET scan the same day he was having chemotherapy.  This is not acceptable.  He really needs to have the PET scan before I see him to help me decide if he needs treatment with chemotherapy.  I want to use immunotherapy but he had marked hepatic inflammation from Tecentriq.  This resolved pretty quickly.  I might be able to use a different immunotherapy agent.  He is eating okay.  He is having no problems with nausea or vomiting.  There is no diarrhea.  He has had no mouth sores.    Overall, his performance status is ECOG 1.     Medications:  Current Outpatient Medications:  .  acetaminophen  (TYLENOL) 500 MG tablet, Take 1,000 mg by mouth every 4 (four) hours as needed for moderate pain., Disp: , Rfl:  .  aspirin 81 MG tablet, Take 81 mg by mouth daily., Disp: , Rfl:  .  CALCIUM-IRON-VIT D-VIT K PO, Take 1 tablet by mouth 2 (two) times daily., Disp: , Rfl:  .  gabapentin (NEURONTIN) 300 MG capsule, TAKE 1 CAPSULE BY MOUTH TWICE A DAY THEN TAKE 2 CAPSULES AT BEDTIME (Patient taking differently: Take 600 mg by mouth 2 (two) times daily. ), Disp: 120 capsule, Rfl: 4 .  lidocaine-prilocaine (EMLA) cream, Apply to affected area once, Disp: 30 g, Rfl: 3 .  LORazepam (ATIVAN) 0.5 MG tablet, Take 1 tablet (0.5 mg total) by mouth every 6 (six) hours as needed (Nausea or vomiting). (Patient not taking: Reported on 03/26/2018), Disp: 30 tablet, Rfl: 0 .  ondansetron (ZOFRAN) 8 MG tablet, Take 1 tablet (8 mg total) by mouth 2 (two) times daily as needed for nausea or vomiting., Disp: 20 tablet, Rfl: 0 .  OVER THE COUNTER MEDICATION, every morning. Juice Plus -- 8 capsule of each the garden, vineyard, and orchard twice a day., Disp: , Rfl:  .  prochlorperazine (COMPAZINE) 10 MG tablet, Take 1 tablet (10 mg total) by mouth every 6 (six) hours as needed (Nausea  or vomiting). (Patient not taking: Reported on 03/23/2018), Disp: 30 tablet, Rfl: 1 .  ZETIA 10 MG tablet, Take 10 mg by mouth daily. , Disp: , Rfl:  No current facility-administered medications for this visit.   Facility-Administered Medications Ordered in Other Visits:  .  octreotide (SANDOSTATIN LAR) IM injection 30 mg, 30 mg, Intramuscular, Once, Shantasia Hunnell, Rudell Cobb, MD  Allergies:  Allergies  Allergen Reactions  . Codeine Nausea And Vomiting  . Hydrocodone Nausea And Vomiting    Past Medical History, Surgical history, Social history, and Family History were reviewed and updated.  Review of Systems: Review of Systems  Constitutional: Negative.   HENT: Negative.   Eyes: Negative.   Respiratory: Negative.   Cardiovascular: Negative.    Gastrointestinal: Negative.   Genitourinary: Negative.  Negative for urgency.  Musculoskeletal: Positive for joint pain.  Skin: Negative.   Neurological: Positive for focal weakness.  Endo/Heme/Allergies: Negative.   Psychiatric/Behavioral: Negative.   All other systems reviewed and are negative.    Physical Exam:  weight is 151 lb (68.5 kg). His oral temperature is 97.8 F (36.6 C). His blood pressure is 117/73 and his pulse is 94. His respiration is 18 and oxygen saturation is 100%.   Physical Exam Vitals signs reviewed.  HENT:     Head: Normocephalic and atraumatic.  Eyes:     Pupils: Pupils are equal, round, and reactive to light.  Neck:     Musculoskeletal: Normal range of motion.  Cardiovascular:     Rate and Rhythm: Normal rate and regular rhythm.     Heart sounds: Normal heart sounds.  Pulmonary:     Effort: Pulmonary effort is normal.     Breath sounds: Normal breath sounds.  Abdominal:     General: Bowel sounds are normal.     Palpations: Abdomen is soft.  Musculoskeletal: Normal range of motion.        General: No tenderness or deformity.  Lymphadenopathy:     Cervical: No cervical adenopathy.  Skin:    General: Skin is warm and dry.     Findings: No erythema or rash.  Neurological:     Mental Status: He is alert and oriented to person, place, and time.  Psychiatric:        Behavior: Behavior normal.        Thought Content: Thought content normal.        Judgment: Judgment normal.      Lab Results  Component Value Date   WBC 7.7 05/05/2018   HGB 9.0 (L) 05/05/2018   HCT 28.4 (L) 05/05/2018   MCV 106.0 (H) 05/05/2018   PLT 183 05/05/2018     Chemistry      Component Value Date/Time   NA 137 05/05/2018 1045   NA 144 12/14/2016 1159   NA 140 10/22/2016 1129   K 4.0 05/05/2018 1045   K 3.5 12/14/2016 1159   K 4.3 10/22/2016 1129   CL 98 05/05/2018 1045   CL 103 12/14/2016 1159   CO2 29 05/05/2018 1045   CO2 30 12/14/2016 1159   CO2 27  10/22/2016 1129   BUN 22 05/05/2018 1045   BUN 16 12/14/2016 1159   BUN 14.0 10/22/2016 1129   CREATININE 1.08 05/05/2018 1045   CREATININE 1.3 (H) 12/14/2016 1159   CREATININE 1.1 10/22/2016 1129      Component Value Date/Time   CALCIUM 10.0 05/05/2018 1045   CALCIUM 10.0 12/14/2016 1159   CALCIUM 10.4 10/22/2016 1129   ALKPHOS 96  05/05/2018 1045   ALKPHOS 48 12/14/2016 1159   ALKPHOS 60 10/22/2016 1129   AST 13 (L) 05/05/2018 1045   AST 16 10/22/2016 1129   ALT 13 05/05/2018 1045   ALT 23 12/14/2016 1159   ALT 14 10/22/2016 1129   BILITOT 0.4 05/05/2018 1045   BILITOT 0.31 10/22/2016 1129         Impression and Plan: Mr. 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  He has had 4 cycles of chemotherapy.  We had to omit immunotherapy after the second cycle.  His liver tests have normalized quite nicely.  I do not want to give up on immunotherapy yet.  I really want to give immunotherapy to try in the maintenance setting.  I do think this is very important for [place] to use.  If the PET scan still looks like there is disease, we might go with 2 more cycles of chemotherapy.  Again, I will put the request in to move his PET scan up to April 30.  We will see him back on May 4.      [name], MD 4/20/20203:47 PM

## 2018-05-07 ENCOUNTER — Other Ambulatory Visit: Payer: Medicare Other

## 2018-05-07 ENCOUNTER — Ambulatory Visit: Payer: Medicare Other

## 2018-05-07 ENCOUNTER — Ambulatory Visit: Payer: Medicare Other | Admitting: Hematology & Oncology

## 2018-05-08 ENCOUNTER — Ambulatory Visit: Payer: Medicare Other

## 2018-05-09 ENCOUNTER — Ambulatory Visit: Payer: Medicare Other

## 2018-05-09 ENCOUNTER — Telehealth: Payer: Self-pay | Admitting: Cardiology

## 2018-05-09 NOTE — Telephone Encounter (Signed)
Virtual Visit Pre-Appointment Phone Call  "(Name), I am calling you today to discuss your upcoming appointment. We are currently trying to limit exposure to the virus that causes COVID-19 by seeing patients at home rather than in the office."  1. "What is the BEST phone number to call the day of the visit?" - include this in appointment notes  2. Do you have or have access to (through a family member/friend) a smartphone with video capability that we can use for your visit?" a. If yes - list this number in appt notes as cell (if different from BEST phone #) and list the appointment type as a VIDEO visit in appointment notes b. If no - list the appointment type as a PHONE visit in appointment notes  3. Confirm consent - "In the setting of the current Covid19 crisis, you are scheduled for a (phone or video) visit with your provider on (date) at (time).  Just as we do with many in-office visits, in order for you to participate in this visit, we must obtain consent.  If you'd like, I can send this to your mychart (if signed up) or email for you to review.  Otherwise, I can obtain your verbal consent now.  All virtual visits are billed to your insurance company just like a normal visit would be.  By agreeing to a virtual visit, we'd like you to understand that the technology does not allow for your provider to perform an examination, and thus may limit your provider's ability to fully assess your condition. If your provider identifies any concerns that need to be evaluated in person, we will make arrangements to do so.  Finally, though the technology is pretty good, we cannot assure that it will always work on either your or our end, and in the setting of a video visit, we may have to convert it to a phone-only visit.  In either situation, we cannot ensure that we have a secure connection.  Are you willing to proceed?" STAFF: Did the patient verbally acknowledge consent to telehealth visit? Document  YES/NO here: YES  4. Advise patient to be prepared - "Two hours prior to your appointment, go ahead and check your blood pressure, pulse, oxygen saturation, and your weight (if you have the equipment to check those) and write them all down. When your visit starts, your provider will ask you for this information. If you have an Apple Watch or Kardia device, please plan to have heart rate information ready on the day of your appointment. Please have a pen and paper handy nearby the day of the visit as well."  5. Give patient instructions for MyChart download to smartphone OR Doximity/[email or web address] as below if video visit (depending on what platform provider is using)  6. Inform patient they will receive a phone call 15 minutes prior to their appointment time (may be from unknown caller ID) so they should be prepared to answer    TELEPHONE CALL NOTE  [name] has been deemed a candidate for a follow-up tele-health visit to limit community exposure during the Covid-19 pandemic. I spoke with the patient via phone to ensure availability of phone/video source, confirm preferred email & phone number, and discuss instructions and expectations.  I reminded [name] to be prepared with any vital sign and/or heart rhythm information that could potentially be obtained via home monitoring, at the time of his visit. I reminded [name] to expect a phone call prior to  his visit.  Isaiah Blakes 05/09/2018 4:11 PM   INSTRUCTIONS FOR DOWNLOADING THE MYCHART APP TO SMARTPHONE  - The patient must first make sure to have activated MyChart and know their login information - If Apple, go to [employer] and type in MyChart in the search bar and download the app. If Android, ask patient to go to [employer] and type in [place] in the search bar and download the app. The app is free but as with any other app downloads, their phone may require them to verify saved payment information or Apple/Android  password.  - The patient will need to then log into the app with their MyChart username and password, and select [facility] as their healthcare provider to link the account. When it is time for your visit, go to the MyChart app, find appointments, and click Begin Video Visit. Be sure to Select Allow for your device to access the Microphone and Camera for your visit. You will then be connected, and your provider will be with you shortly.  **If they have any issues connecting, or need assistance please contact MyChart service desk [phone])**  **If using a computer, in order to ensure the best quality for their visit they will need to use either of the following Internet Browsers: [employer], or Google Chrome**  IF USING DOXIMITY or DOXY.ME - The patient will receive a link just prior to their visit by text.     FULL LENGTH CONSENT FOR TELE-HEALTH VISIT   I hereby voluntarily request, consent and authorize [facility] and its employed or contracted physicians, physician assistants, nurse practitioners or other licensed health care professionals (the Practitioner), to provide me with telemedicine health care services (the Services") as deemed necessary by the treating Practitioner. I acknowledge and consent to receive the Services by the Practitioner via telemedicine. I understand that the telemedicine visit will involve communicating with the Practitioner through live audiovisual communication technology and the disclosure of certain medical information by electronic transmission. I acknowledge that I have been given the opportunity to request an in-person assessment or other available alternative prior to the telemedicine visit and am voluntarily participating in the telemedicine visit.  I understand that I have the right to withhold or withdraw my consent to the use of telemedicine in the course of my care at any time, without affecting my right to future care or treatment,  and that the Practitioner or I may terminate the telemedicine visit at any time. I understand that I have the right to inspect all information obtained and/or recorded in the course of the telemedicine visit and may receive copies of available information for a reasonable fee.  I understand that some of the potential risks of receiving the Services via telemedicine include:   Delay or interruption in medical evaluation due to technological equipment failure or disruption;  Information transmitted may not be sufficient (e.g. poor resolution of images) to allow for appropriate medical decision making by the Practitioner; and/or   In rare instances, security protocols could fail, causing a breach of personal health information.  Furthermore, I acknowledge that it is my responsibility to provide information about my medical history, conditions and care that is complete and accurate to the best of my ability. I acknowledge that Practitioner's advice, recommendations, and/or decision may be based on factors not within their control, such as incomplete or inaccurate data provided by me or distortions of diagnostic images or specimens that may result from electronic transmissions. I  understand that the practice of medicine is not an exact science and that Practitioner makes no warranties or guarantees regarding treatment outcomes. I acknowledge that I will receive a copy of this consent concurrently upon execution via email to the email address I last provided but may also request a printed copy by calling the office of [facility].    I understand that my insurance will be billed for this visit.   I have read or had this consent read to me.  I understand the contents of this consent, which adequately explains the benefits and risks of the Services being provided via telemedicine.   I have been provided ample opportunity to ask questions regarding this consent and the Services and have had my questions  answered to my satisfaction.  I give my informed consent for the services to be provided through the use of telemedicine in my medical care  By participating in this telemedicine visit I agree to the above.     Cardiac Questionnaire:    Since your last visit or hospitalization:    1. Have you been having new or worsening chest pain? no   2. Have you been having new or worsening shortness of breath? some 3. Have you been having new or worsening leg swelling, wt gain, or increase in abdominal girth (pants fitting more tightly)? no   4. Have you had any passing out spells? no    *A YES to any of these questions would result in the appointment being kept. *If all the answers to these questions are NO, we should indicate that given the current situation regarding the worldwide coronarvirus pandemic, at the recommendation of the CDC, we are looking to limit gatherings in our waiting area, and thus will reschedule their appointment beyond four weeks from today.   _____________   [id number] Pre-Screening Questions:   Do you currently have a fever? no (yes = cancel and refer to pcp for e-visit)  Have you recently travelled on a cruise, internationally, or to [place], [place], [place], [place], [place], or [place], [place]) ? no (yes = cancel, stay home, monitor symptoms, and contact pcp or initiate e-visit if symptoms develop)  Have you been in contact with someone that is currently pending confirmation of Covid19 testing or has been confirmed to have the [place] virus?  no (yes = cancel, stay home, away from tested individual, monitor symptoms, and contact pcp or initiate e-visit if symptoms develop)  Are you currently experiencing fatigue or cough? no (yes = pt should be prepared to have a mask placed at the time of their visit).

## 2018-05-12 ENCOUNTER — Telehealth: Payer: Self-pay | Admitting: *Deleted

## 2018-05-12 ENCOUNTER — Ambulatory Visit (HOSPITAL_COMMUNITY)
Admission: RE | Admit: 2018-05-12 | Discharge: 2018-05-12 | Disposition: A | Discharge status: Home / Self Care | Payer: Medicare Other | Source: Ambulatory Visit | Attending: Hematology & Oncology | Admitting: Hematology & Oncology

## 2018-05-12 ENCOUNTER — Other Ambulatory Visit: Payer: Self-pay

## 2018-05-12 DIAGNOSIS — C7800 Secondary malignant neoplasm of unspecified lung: Secondary | ICD-10-CM | POA: Diagnosis not present

## 2018-05-12 DIAGNOSIS — C7A8 Other malignant neuroendocrine tumors: Secondary | ICD-10-CM | POA: Insufficient documentation

## 2018-05-12 DIAGNOSIS — C7A1 Malignant poorly differentiated neuroendocrine tumors: Secondary | ICD-10-CM | POA: Diagnosis not present

## 2018-05-12 MED ORDER — GALLIUM GA 68 DOTATATE IV KIT
3.6000 | PACK | Freq: Once | INTRAVENOUS | Status: AC | PRN
  Administered 2018-05-12: 3.6 via INTRAVENOUS

## 2018-05-12 MED FILL — GABAPENTIN 300 MG CAPSULE: 300 | 30 days supply | Qty: 120 | Fill #1 | Status: TO

## 2018-05-12 NOTE — Telephone Encounter (Signed)
Per MD, instructed pt take Benadryl 1 tablet PO at night, use topical solution during day time. Call office it rash continues.  Pt verbalized understanding

## 2018-05-12 NOTE — Telephone Encounter (Signed)
Returned call to pt regarding rash. Pt states " I have a rash to my forehead, cheek and arms. Rash itches on my forehead. I've tried hydrocortisone cream, it has not helped." Pt denies drainage, rash appears to be hives, pt denied changing detergents, using any lotions, new medications or foods. Pt has not been outside in the yard or woods. Will review with MD and call back with further instructions

## 2018-05-13 ENCOUNTER — Telehealth (INDEPENDENT_AMBULATORY_CARE_PROVIDER_SITE_OTHER): Payer: Medicare Other | Admitting: Cardiology

## 2018-05-13 ENCOUNTER — Encounter: Payer: Self-pay | Admitting: Cardiology

## 2018-05-13 DIAGNOSIS — C7951 Secondary malignant neoplasm of bone: Secondary | ICD-10-CM

## 2018-05-13 DIAGNOSIS — E119 Type 2 diabetes mellitus without complications: Secondary | ICD-10-CM

## 2018-05-13 DIAGNOSIS — I10 Essential (primary) hypertension: Secondary | ICD-10-CM

## 2018-05-13 DIAGNOSIS — R0789 Other chest pain: Secondary | ICD-10-CM

## 2018-05-13 DIAGNOSIS — E785 Hyperlipidemia, unspecified: Secondary | ICD-10-CM

## 2018-05-13 NOTE — Patient Instructions (Signed)
Medication Instructions:  Your physician recommends that you continue on your current medications as directed. Please refer to the Current Medication list given to you today.  If you need a refill on your cardiac medications before your next appointment, please call your pharmacy.   Lab work: None If you have labs (blood work) drawn today and your tests are completely normal, you will receive your results only by: Mar[facility] MyChart Message (if you have MyChart) OR . A paper copy in the mail If you have any lab test that is abnormal or we need to change your treatment, we will call you to review the results.  Testing/Procedures: None  Follow-Up: At [facility], you and your health needs are our priority.  As part of our continuing mission to provide you with exceptional heart care, we have created designated Provider Care Teams.  These Care Teams include your primary Cardiologist (physician) and Advanced Practice Providers (APPs -  Physician Assistants and Nurse Practitioners) who all work together to provide you with the care you need, when you need it. You will need a follow up appointment in 1 months Any Other Special Instructions Will Be Listed Below (If Applicable).

## 2018-05-13 NOTE — Progress Notes (Signed)
Virtual Visit via Video Note   This visit type was conducted due to national recommendations for restrictions regarding the COVID-19 Pandemic (e.g. social distancing) in an effort to limit this patient's exposure and mitigate transmission in our community.  Due to his co-morbid illnesses, this patient is at least at moderate risk for complications without adequate follow up.  This format is felt to be most appropriate for this patient at this time.  All issues noted in this document were discussed and addressed.  A limited physical exam was performed with this format.  Please refer to the patient's chart for his consent to telehealth for [facility].  Evaluation Performed:  Follow-up visit  This visit type was conducted due to national recommendations for restrictions regarding the COVID-19 Pandemic (e.g. social distancing).  This format is felt to be most appropriate for this patient at this time.  All issues noted in this document were discussed and addressed.  No physical exam was performed (except for noted visual exam findings with Video Visits).  Please refer to the patient's chart (MyChart message for video visits and phone note for telephone visits) for the patient's consent to telehealth for [facility].  Date:  05/13/2018  ID: [name], DOB 29-Jul-1945, MRN [id number]   Patient Location: [place] [address]   Provider location:   [place] Office  PCP:  Nicoletta Dress, MD  Cardiologist:  Jenne Campus, MD     Chief Complaint: Doing fine  History of Present Illness:    [name] is a 73 y.o. male  who presents via audio/video conferencing for a telehealth visit today.  Past medical history significant for hypertension, also mildly abnormal stress test dyslipidemia diabetes hypertension.  We had a visit today to look at his issues recently he ended going to the emergency because of chest pain.  It happened at night.   Previously he had similar pain he lay down on the left side and pain subsided this time it was not the case he eventually got alarmed enough that he ended going to the emergency room EKG was done which was normal he also got a troponin size or will normal and he was eventually discharged home.  Since that time he seems to be doing well described to have some shortness of breath while walking on the stairs at home and it happened only during the day that he got his chemotherapy treatment it happens before as well as after the chemotherapy treatment.  He did have a PET scan done yesterday which actually looks quite promising and he does have another appointment with oncologist to decide about need to continue with his therapy.  I decided to simply wait and see how situation will evolve.  I will see him back in my office in about 1 month and then will decide if we need to add any medications to his medical regimen.   The patient does not have symptoms concerning for COVID-19 infection (fever, chills, cough, or new SHORTNESS OF BREATH).    Prior CV studies:   The following studies were reviewed today:       Past Medical History:  Diagnosis Date  . Complication of anesthesia   . Constipation    due to pain medication  . 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 Dyspnea    with exertion  . Essential hypertension 07/12/2014  . Goals of care, counseling/discussion 01/29/2018  .  High blood sugar   . 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 PONV (postoperative nausea and vomiting)   . 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 Radiation 04/22/14-05/06/14   right sacrum 30 gray  . 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 CIRCUMCISION     when patient was 73 years old  . COLONOSCOPY    . COLONOSCOPY W/ POLYPECTOMY    .  ESOPHAGOGASTRODUODENOSCOPY    . HAND SURGERY Left    fracture  . IR IMAGING GUIDED PORT INSERTION  02/10/2018  . LUMBAR LAMINECTOMY/DECOMPRESSION MICRODISCECTOMY Right 09/12/2016   Procedure: LAMINECTOMY AND FORAMINOTOMY RIGHT LUMBAR FIVE- SACRAL ONE, SACRAL ONE- SACRAL TWO DECOMPRESSION OF RIGHT SACRAL ONE NERVE ROOT;  Surgeon: [name], MD;  Location: [place];  Service: Neurosurgery;  Laterality: Right;  . ROBOT ASSISTED LAPAROSCOPIC RADICAL PROSTATECTOMY    . TONSILECTOMY/ADENOIDECTOMY WITH MYRINGOTOMY       Current Meds  Medication Sig  . acetaminophen (TYLENOL) 500 MG tablet Take 1,000 mg by mouth every 4 (four) hours as needed for moderate pain.  Marland Kitchen aspirin 81 MG tablet Take 81 mg by mouth daily.  Marland Kitchen CALCIUM-IRON-VIT D-VIT K PO Take 1 tablet by mouth 2 (two) times daily.  Marland Kitchen gabapentin (NEURONTIN) 300 MG capsule TAKE 1 CAPSULE BY MOUTH TWICE A DAY THEN TAKE 2 CAPSULES AT BEDTIME (Patient taking differently: Take 600 mg by mouth 2 (two) times daily. )  . lidocaine-prilocaine (EMLA) cream Apply to affected area once  . LORazepam (ATIVAN) 0.5 MG tablet Take 1 tablet (0.5 mg total) by mouth every 6 (six) hours as needed (Nausea or vomiting).  . ondansetron (ZOFRAN) 8 MG tablet Take 1 tablet (8 mg total) by mouth 2 (two) times daily as needed for nausea or vomiting.  Marland Kitchen OVER THE COUNTER MEDICATION every morning. Juice Plus -- 8 capsule of each the garden, vineyard, and orchard twice a day.  . prochlorperazine (COMPAZINE) 10 MG tablet Take 1 tablet (10 mg total) by mouth every 6 (six) hours as needed (Nausea or vomiting).  Marland Kitchen ZETIA 10 MG tablet Take 10 mg by mouth daily.       Family History: The patient's family history includes Heart disease in his father and mother; Prostate cancer in his father.   ROS:   Please see the history of present illness.     All other systems reviewed and are negative.   Labs/Other Tests and Data Reviewed:     Recent Labs: 04/07/2018: TSH 2.956  05/05/2018: ALT 13; BUN 22; Creatinine 1.08; Hemoglobin 9.0; Platelet Count 183; Potassium 4.0; Sodium 137  Recent Lipid Panel    Component Value Date/Time   CHOL 230 (H) 03/23/2018 1847   TRIG 209 (H) 03/23/2018 1847   HDL 46 03/23/2018 1847   CHOLHDL 5.0 03/23/2018 1847   VLDL 42 (H) 03/23/2018 1847   LDLCALC 142 (H) 03/23/2018 1847      Exam:    Vital Signs:  There were no vitals taken for this visit.    Wt Readings from Last 3 Encounters:  05/05/18 151 lb (68.5 kg)  04/28/18 150 lb (68 kg)  04/07/18 149 lb (67.6 kg)     Well nourished, well developed in no acute distress. Alert awake and at the time 3.  I see on the video link from his home he is doing well and he looks good.  He lost  his hair but otherwise seems to be doing well he is quite optimistic  Diagnosis for this visit:   1. Essential hypertension   2. Type 2 diabetes mellitus without complication, without long-term current use of insulin (HCC)   3. Cancer, metastatic to bone (HCC)   4. Other chest pain   5. Dyslipidemia      ASSESSMENT & PLAN:    1.  Essential hypertension blood pressure today 128/77 with heart rate 89 is normal we will continue present management. 2.  Type 2 diabetes doing well from that point review. 3.  Cancer with metastasis to the bone followed by oncology team.  Hospice can look promising. 4.  Atypical chest pain with mildly abnormal stress test.  Will await talk to him again about a month to make a decision if anything else needs to be done. 5.  Dyslipidemia: He is only on Zetia and I will continue this regimen for now.  COVID-19 Education: The signs and symptoms of COVID-19 were discussed with the patient and how to seek care for testing (follow up with PCP or arrange E-visit).  The importance of social distancing was discussed today.  Patient Risk:   After full review of this patients clinical status, I feel that they are at least moderate risk at this time.  Time:   Today, I  have spent 17 minutes with the patient with telehealth technology discussing pt health issues.  I spent 5 minutes reviewing her chart before the visit.  Visit was finished at 11:18 AM.    Medication Adjustments/Labs and Tests Ordered: Current medicines are reviewed at length with the patient today.  Concerns regarding medicines are outlined above.  No orders of the defined types were placed in this encounter.  Medication changes: No orders of the defined types were placed in this encounter.    Disposition: Follow-up 1 month  Signed, Park Liter, MD, [facility] 05/13/2018 11:17 AM    [place]

## 2018-05-14 ENCOUNTER — Other Ambulatory Visit: Payer: Self-pay | Admitting: Hematology & Oncology

## 2018-05-15 DIAGNOSIS — L300 Nummular dermatitis: Secondary | ICD-10-CM | POA: Diagnosis not present

## 2018-05-15 DIAGNOSIS — L578 Other skin changes due to chronic exposure to nonionizing radiation: Secondary | ICD-10-CM | POA: Diagnosis not present

## 2018-05-19 ENCOUNTER — Ambulatory Visit: Payer: Medicare Other | Admitting: Hematology & Oncology

## 2018-05-19 ENCOUNTER — Other Ambulatory Visit: Payer: Medicare Other

## 2018-05-19 ENCOUNTER — Inpatient Hospital Stay: Payer: Medicare Other

## 2018-05-19 ENCOUNTER — Telehealth: Payer: Self-pay | Admitting: Hematology & Oncology

## 2018-05-19 ENCOUNTER — Other Ambulatory Visit: Payer: Self-pay

## 2018-05-19 ENCOUNTER — Ambulatory Visit: Payer: Medicare Other

## 2018-05-19 ENCOUNTER — Inpatient Hospital Stay: Payer: Medicare Other | Attending: Hematology & Oncology

## 2018-05-19 ENCOUNTER — Inpatient Hospital Stay (HOSPITAL_BASED_OUTPATIENT_CLINIC_OR_DEPARTMENT_OTHER): Payer: Medicare Other | Admitting: Hematology & Oncology

## 2018-05-19 ENCOUNTER — Encounter: Payer: Self-pay | Admitting: Hematology & Oncology

## 2018-05-19 VITALS — BP 129/67 | HR 86 | Temp 97.9°F | Resp 18 | Wt 151.0 lb

## 2018-05-19 DIAGNOSIS — C7A8 Other malignant neuroendocrine tumors: Secondary | ICD-10-CM

## 2018-05-19 DIAGNOSIS — Z7982 Long term (current) use of aspirin: Secondary | ICD-10-CM | POA: Insufficient documentation

## 2018-05-19 DIAGNOSIS — R531 Weakness: Secondary | ICD-10-CM | POA: Insufficient documentation

## 2018-05-19 DIAGNOSIS — Z5112 Encounter for antineoplastic immunotherapy: Secondary | ICD-10-CM | POA: Insufficient documentation

## 2018-05-19 DIAGNOSIS — M2550 Pain in unspecified joint: Secondary | ICD-10-CM | POA: Diagnosis not present

## 2018-05-19 DIAGNOSIS — C61 Malignant neoplasm of prostate: Secondary | ICD-10-CM | POA: Insufficient documentation

## 2018-05-19 DIAGNOSIS — Z79899 Other long term (current) drug therapy: Secondary | ICD-10-CM

## 2018-05-19 DIAGNOSIS — C7951 Secondary malignant neoplasm of bone: Secondary | ICD-10-CM | POA: Diagnosis not present

## 2018-05-19 DIAGNOSIS — Z5189 Encounter for other specified aftercare: Secondary | ICD-10-CM | POA: Diagnosis present

## 2018-05-19 LAB — CMP (CANCER CENTER ONLY)
ALT: 14 U/L (ref 0–44)
AST: 14 U/L — ABNORMAL LOW (ref 15–41)
Albumin: 4.1 g/dL (ref 3.5–5.0)
Alkaline Phosphatase: 73 U/L (ref 38–126)
Anion gap: 9 (ref 5–15)
BUN: 15 mg/dL (ref 8–23)
CO2: 27 mmol/L (ref 22–32)
Calcium: 9.5 mg/dL (ref 8.9–10.3)
Chloride: 103 mmol/L (ref 98–111)
Creatinine: 1.11 mg/dL (ref 0.61–1.24)
GFR, Est AFR Am: >60 mL/min (ref >60)
GFR, Estimated: >60 mL/min (ref >60)
Glucose, Bld: 102 mg/dL — ABNORMAL HIGH (ref 70–99)
Potassium: 4 mmol/L (ref 3.5–5.1)
Sodium: 139 mmol/L (ref 135–145)
Total Bilirubin: 0.2 mg/dL — ABNORMAL LOW (ref 0.3–1.2)
Total Protein: 6.5 g/dL (ref 6.5–8.1)

## 2018-05-19 LAB — CBC WITH DIFFERENTIAL (CANCER CENTER ONLY)
Abs Immature Granulocytes: 0.13 10*3/uL — ABNORMAL HIGH (ref 0.00–0.07)
Basophils Absolute: 0 10*3/uL (ref 0.0–0.1)
Basophils Relative: 1 %
Eosinophils Absolute: 0 10*3/uL (ref 0.0–0.5)
Eosinophils Relative: 0 %
HCT: 26.2 % — ABNORMAL LOW (ref 39.0–52.0)
Hemoglobin: 8.4 g/dL — ABNORMAL LOW (ref 13.0–17.0)
Immature Granulocytes: 2 %
Lymphocytes Relative: 14 %
Lymphs Abs: 0.8 10*3/uL (ref 0.7–4.0)
MCH: 34.3 pg — ABNORMAL HIGH (ref 26.0–34.0)
MCHC: 32.1 g/dL (ref 30.0–36.0)
MCV: 106.9 fL — ABNORMAL HIGH (ref 80.0–100.0)
Monocytes Absolute: 0.9 10*3/uL (ref 0.1–1.0)
Monocytes Relative: 17 %
Neutro Abs: 3.7 10*3/uL (ref 1.7–7.7)
Neutrophils Relative %: 66 %
Platelet Count: 247 10*3/uL (ref 150–400)
RBC: 2.45 MIL/uL — ABNORMAL LOW (ref 4.22–5.81)
RDW: 17.3 % — ABNORMAL HIGH (ref 11.5–15.5)
WBC Count: 5.6 10*3/uL (ref 4.0–10.5)
nRBC: 0 % (ref 0.0–0.2)

## 2018-05-19 LAB — LACTATE DEHYDROGENASE: LDH: 195 U/L — ABNORMAL HIGH (ref 98–192)

## 2018-05-19 MED ORDER — HEPARIN SOD (PORK) LOCK FLUSH 100 UNIT/ML IV SOLN
500.0000 [IU] | Freq: Once | INTRAVENOUS | Status: AC | PRN
  Administered 2018-05-19: 500 [IU]
  Filled 2018-05-19: qty 5

## 2018-05-19 MED ORDER — SODIUM CHLORIDE 0.9 % IV SOLN
Freq: Once | INTRAVENOUS | Status: AC
  Administered 2018-05-19: 09:00:00 via INTRAVENOUS
  Filled 2018-05-19: qty 250

## 2018-05-19 MED ORDER — SODIUM CHLORIDE 0.9 % IV SOLN
200.0000 mg | Freq: Once | INTRAVENOUS | Status: AC
  Administered 2018-05-19: 200 mg via INTRAVENOUS
  Filled 2018-05-19: qty 8

## 2018-05-19 MED ORDER — SODIUM CHLORIDE 0.9% FLUSH
10.0000 mL | INTRAVENOUS | Status: DC | PRN
  Administered 2018-05-19: 10 mL
  Filled 2018-05-19: qty 10

## 2018-05-19 NOTE — Patient Instructions (Signed)
Implanted Port Insertion, Care After  This sheet gives you information about how to care for yourself after your procedure. Your health care provider may also give you more specific instructions. If you have problems or questions, contact your health care provider.  What can I expect after the procedure?  After the procedure, it is common to have:  · Discomfort at the port insertion site.  · Bruising on the skin over the port. This should improve over 3-4 days.  Follow these instructions at home:  Port care  · After your port is placed, you will get a manufacturer's information card. The card has information about your port. Keep this card with you at all times.  · Take care of the port as told by your health care provider. Ask your health care provider if you or a family member can get training for taking care of the port at home. A home health care nurse may also take care of the port.  · Make sure to remember what type of port you have.  Incision care         · Follow instructions from your health care provider about how to take care of your port insertion site. Make sure you:  ? Wash your hands with soap and water before and after you change your bandage (dressing). If soap and water are not available, use hand sanitizer.  ? Change your dressing as told by your health care provider.  ? Leave stitches (sutures), skin glue, or adhesive strips in place. These skin closures may need to stay in place for 2 weeks or longer. If adhesive strip edges start to loosen and curl up, you may trim the loose edges. Do not remove adhesive strips completely unless your health care provider tells you to do that.  · Check your port insertion site every day for signs of infection. Check for:  ? Redness, swelling, or pain.  ? Fluid or blood.  ? Warmth.  ? Pus or a bad smell.  Activity  · Return to your normal activities as told by your health care provider. Ask your health care provider what activities are safe for you.  · Do not  lift anything that is heavier than 10 lb (4.5 kg), or the limit that you are told, until your health care provider says that it is safe.  General instructions  · Take over-the-counter and prescription medicines only as told by your health care provider.  · Do not take baths, swim, or use a hot tub until your health care provider approves. Ask your health care provider if you may take showers. You may only be allowed to take sponge baths.  · Do not drive for 24 hours if you were given a sedative during your procedure.  · Wear a medical alert bracelet in case of an emergency. This will tell any health care providers that you have a port.  · Keep all follow-up visits as told by your health care provider. This is important.  Contact a health care provider if:  · You cannot flush your port with saline as directed, or you cannot draw blood from the port.  · You have a fever or chills.  · You have redness, swelling, or pain around your port insertion site.  · You have fluid or blood coming from your port insertion site.  · Your port insertion site feels warm to the touch.  · You have pus or a bad smell coming from the port   insertion site.  Get help right away if:  · You have chest pain or shortness of breath.  · You have bleeding from your port that you cannot control.  Summary  · Take care of the port as told by your health care provider. Keep the manufacturer's information card with you at all times.  · Change your dressing as told by your health care provider.  · Contact a health care provider if you have a fever or chills or if you have redness, swelling, or pain around your port insertion site.  · Keep all follow-up visits as told by your health care provider.  This information is not intended to replace advice given to you by your health care provider. Make sure you discuss any questions you have with your health care provider.  Document Released: 10/22/2012 Document Revised: 07/30/2017 Document Reviewed:  07/30/2017  Elsevier Interactive Patient Education © 2019 Elsevier Inc.

## 2018-05-19 NOTE — Telephone Encounter (Signed)
sw pt to confirm 5/26 appts at 12 pm per 5/4 LOS

## 2018-05-19 NOTE — Progress Notes (Signed)
Hematology and Oncology Follow Up Visit  RALPHAEL SOUTHGATE [id number] [birth year] 73 y.o. 05/19/2018   Principle Diagnosis:  Metastatic small cell carcinoma --unknown primary  Metastatic Prostate cancer -- castrate sensitive   Current Therapy:    Zytiga 1000 mg by mouth daily - discontinued on 08/15/2016  Xtandi 160 mg po q day - start on 08/15/2016 - discontinued  Xgeva 120 mg subcutaneous Q3 months - due in July 2020  Lupron 22 mg IM every 3 months - due in July 2020  Radium-223 therapy -- s/p cycle #6  Palliative radiation to the right sacrum  Lutathera - s/p cycle 4/4 --last dose on 07/11/2017  Carbo/VP-16/Tecentriq --  S/p cycle #4  Keytruda q 3 week -- Maintanence -- started on 05/19/2018     Interim History:  Mr.  Brodowski is back for for an early follow-up.  He is doing quite well.  We did do another PET scan on him.  This was the dotatate PET scan.  This was done last week.  Thankfully, the PET scan showed that he was still responding.  There is only one nodule in his lung that was active.  I think this was sub-centimeter and only had an SUV of 2.1.  At this point, we will see how he does on maintenance immunotherapy.  I really believe that immunotherapy has been incredibly helpful for him.  With Tecentriq, he had hepatic inflammation.  We will try him on Keytruda see how he does with this.  He is eating well.  He is having no nausea or vomiting.  There is no diarrhea.  He has had no bleeding.  He has had no fever.  He is getting around better.  He does have a brace on the right lower leg.  He has little bit of a foot drop.  Overall, his performance status is ECOG 1.     Medications:  Current Outpatient Medications:  .  acetaminophen (TYLENOL) 500 MG tablet, Take 1,000 mg by mouth every 4 (four) hours as needed for moderate pain., Disp: , Rfl:  .  aspirin 81 MG tablet, Take 81 mg by mouth daily., Disp: , Rfl:  .  CALCIUM-IRON-VIT D-VIT K PO, Take 1 tablet by mouth 2  (two) times daily., Disp: , Rfl:  .  gabapentin (NEURONTIN) 300 MG capsule, TAKE 1 CAPSULE BY MOUTH TWICE A DAY THEN TAKE 2 CAPSULES AT BEDTIME (Patient taking differently: Take 600 mg by mouth 2 (two) times daily. ), Disp: 120 capsule, Rfl: 4 .  ondansetron (ZOFRAN) 8 MG tablet, Take 1 tablet (8 mg total) by mouth 2 (two) times daily as needed for nausea or vomiting., Disp: 20 tablet, Rfl: 0 .  OVER THE COUNTER MEDICATION, every morning. Juice Plus -- 8 capsule of each the garden, vineyard, and orchard twice a day., Disp: , Rfl:  .  ZETIA 10 MG tablet, Take 10 mg by mouth daily. , Disp: , Rfl:  No current facility-administered medications for this visit.   Facility-Administered Medications Ordered in Other Visits:  .  octreotide (SANDOSTATIN LAR) IM injection 30 mg, 30 mg, Intramuscular, Once, Kenslei Hearty, Rudell Cobb, MD  Allergies:  Allergies  Allergen Reactions  . Codeine Nausea And Vomiting  . Hydrocodone Nausea And Vomiting    Past Medical History, Surgical history, Social history, and Family History were reviewed and updated.  Review of Systems: Review of Systems  Constitutional: Negative.   HENT: Negative.   Eyes: Negative.   Respiratory: Negative.   Cardiovascular: Negative.  Gastrointestinal: Negative.   Genitourinary: Negative.  Negative for urgency.  Musculoskeletal: Positive for joint pain.  Skin: Negative.   Neurological: Positive for focal weakness.  Endo/Heme/Allergies: Negative.   Psychiatric/Behavioral: Negative.   All other systems reviewed and are negative.    Physical Exam:  weight is 151 lb (68.5 kg). His oral temperature is 97.9 F (36.6 C). His blood pressure is 129/67 and his pulse is 86. His respiration is 18 and oxygen saturation is 100%.   Physical Exam Vitals signs reviewed.  HENT:     Head: Normocephalic and atraumatic.  Eyes:     Pupils: Pupils are equal, round, and reactive to light.  Neck:     Musculoskeletal: Normal range of motion.   Cardiovascular:     Rate and Rhythm: Normal rate and regular rhythm.     Heart sounds: Normal heart sounds.  Pulmonary:     Effort: Pulmonary effort is normal.     Breath sounds: Normal breath sounds.  Abdominal:     General: Bowel sounds are normal.     Palpations: Abdomen is soft.  Musculoskeletal: Normal range of motion.        General: No tenderness or deformity.  Lymphadenopathy:     Cervical: No cervical adenopathy.  Skin:    General: Skin is warm and dry.     Findings: No erythema or rash.  Neurological:     Mental Status: He is alert and oriented to person, place, and time.  Psychiatric:        Behavior: Behavior normal.        Thought Content: Thought content normal.        Judgment: Judgment normal.      Lab Results  Component Value Date   WBC 5.6 05/19/2018   HGB 8.4 (L) 05/19/2018   HCT 26.2 (L) 05/19/2018   MCV 106.9 (H) 05/19/2018   PLT 247 05/19/2018     Chemistry      Component Value Date/Time   NA 139 05/19/2018 0815   NA 144 12/14/2016 1159   NA 140 10/22/2016 1129   K 4.0 05/19/2018 0815   K 3.5 12/14/2016 1159   K 4.3 10/22/2016 1129   CL 103 05/19/2018 0815   CL 103 12/14/2016 1159   CO2 27 05/19/2018 0815   CO2 30 12/14/2016 1159   CO2 27 10/22/2016 1129   BUN 15 05/19/2018 0815   BUN 16 12/14/2016 1159   BUN 14.0 10/22/2016 1129   CREATININE 1.11 05/19/2018 0815   CREATININE 1.3 (H) 12/14/2016 1159   CREATININE 1.1 10/22/2016 1129      Component Value Date/Time   CALCIUM 9.5 05/19/2018 0815   CALCIUM 10.0 12/14/2016 1159   CALCIUM 10.4 10/22/2016 1129   ALKPHOS 73 05/19/2018 0815   ALKPHOS 48 12/14/2016 1159   ALKPHOS 60 10/22/2016 1129   AST 14 (L) 05/19/2018 0815   AST 16 10/22/2016 1129   ALT 14 05/19/2018 0815   ALT 23 12/14/2016 1159   ALT 14 10/22/2016 1129   BILITOT 0.2 (L) 05/19/2018 0815   BILITOT 0.31 10/22/2016 1129         Impression and Plan: Mr. Trompeter is 73 year old gentleman with metastatic neuro  endocrine carcinoma of unknown known primary.  He has had 4 cycles of chemotherapy.  We had to omit immunotherapy after the second cycle.  His liver tests have normalized quite nicely.  We will start his maintenance immunotherapy today.  I think this is very helpful.  We are  treating this like a small cell lung cancer.  We will have him come back in 3 weeks for his next cycle of treatment.  I am just very happy that his quality of life has been doing so nicely.     [name], MD 5/4/20203:32 PM

## 2018-05-19 NOTE — Patient Instructions (Signed)
Pembrolizumab injection  What is this medicine?  PEMBROLIZUMAB (pem broe liz ue mab) is a monoclonal antibody. It is used to treat cervical cancer, esophageal cancer, head and neck cancer, hepatocellular cancer, Hodgkin lymphoma, kidney cancer, lymphoma, melanoma, Merkel cell carcinoma, lung cancer, stomach cancer, urothelial cancer, and cancers that have a certain genetic condition.  This medicine may be used for other purposes; ask your health care provider or pharmacist if you have questions.  COMMON BRAND NAME(S): Keytruda  What should I tell my health care provider before I take this medicine?  They need to know if you have any of these conditions:  -diabetes  -immune system problems  -inflammatory bowel disease  -liver disease  -lung or breathing disease  -lupus  -received or scheduled to receive an organ transplant or a stem-cell transplant that uses donor stem cells  -an unusual or allergic reaction to pembrolizumab, other medicines, foods, dyes, or preservatives  -pregnant or trying to get pregnant  -breast-feeding  How should I use this medicine?  This medicine is for infusion into a vein. It is given by a health care professional in a hospital or clinic setting.  A special MedGuide will be given to you before each treatment. Be sure to read this information carefully each time.  Talk to your pediatrician regarding the use of this medicine in children. While this drug may be prescribed for selected conditions, precautions do apply.  Overdosage: If you think you have taken too much of this medicine contact a poison control center or emergency room at once.  NOTE: This medicine is only for you. Do not share this medicine with others.  What if I miss a dose?  It is important not to miss your dose. Call your doctor or health care professional if you are unable to keep an appointment.  What may interact with this medicine?  Interactions have not been studied.  Give your health care provider a list of all the  medicines, herbs, non-prescription drugs, or dietary supplements you use. Also tell them if you smoke, drink alcohol, or use illegal drugs. Some items may interact with your medicine.  This list may not describe all possible interactions. Give your health care provider a list of all the medicines, herbs, non-prescription drugs, or dietary supplements you use. Also tell them if you smoke, drink alcohol, or use illegal drugs. Some items may interact with your medicine.  What should I watch for while using this medicine?  Your condition will be monitored carefully while you are receiving this medicine.  You may need blood work done while you are taking this medicine.  Do not become pregnant while taking this medicine or for 4 months after stopping it. Women should inform their doctor if they wish to become pregnant or think they might be pregnant. There is a potential for serious side effects to an unborn child. Talk to your health care professional or pharmacist for more information. Do not breast-feed an infant while taking this medicine or for 4 months after the last dose.  What side effects may I notice from receiving this medicine?  Side effects that you should report to your doctor or health care professional as soon as possible:  -allergic reactions like skin rash, itching or hives, swelling of the face, lips, or tongue  -bloody or black, tarry  -breathing problems  -changes in vision  -chest pain  -chills  -confusion  -constipation  -cough  -diarrhea  -dizziness or feeling faint or lightheaded  -  fast or irregular heartbeat  -fever  -flushing  -hair loss  -joint pain  -low blood counts - this medicine may decrease the number of white blood cells, red blood cells and platelets. You may be at increased risk for infections and bleeding.  -muscle pain  -muscle weakness  -persistent headache  -redness, blistering, peeling or loosening of the skin, including inside the mouth  -signs and symptoms of high blood sugar  such as dizziness; dry mouth; dry skin; fruity breath; nausea; stomach pain; increased hunger or thirst; increased urination  -signs and symptoms of kidney injury like trouble passing urine or change in the amount of urine  -signs and symptoms of liver injury like dark urine, light-colored stools, loss of appetite, nausea, right upper belly pain, yellowing of the eyes or skin  -sweating  -swollen lymph nodes  -weight loss  Side effects that usually do not require medical attention (report to your doctor or health care professional if they continue or are bothersome):  -decreased appetite  -muscle pain  -tiredness  This list may not describe all possible side effects. Call your doctor for medical advice about side effects. You may report side effects to FDA at 1-800-FDA-1088.  Where should I keep my medicine?  This drug is given in a hospital or clinic and will not be stored at home.  NOTE: This sheet is a summary. It may not cover all possible information. If you have questions about this medicine, talk to your doctor, pharmacist, or health care provider.   2019 Elsevier/Gold Standard (2017-08-15 15:06:10)

## 2018-05-20 ENCOUNTER — Ambulatory Visit: Payer: Medicare Other

## 2018-05-20 LAB — TESTOSTERONE: Testosterone: <3 ng/dL — ABNORMAL LOW (ref 264–916)

## 2018-05-21 ENCOUNTER — Ambulatory Visit: Payer: Medicare Other

## 2018-05-21 ENCOUNTER — Other Ambulatory Visit: Payer: Self-pay | Admitting: Hematology & Oncology

## 2018-05-26 ENCOUNTER — Telehealth: Payer: Self-pay | Admitting: *Deleted

## 2018-05-26 ENCOUNTER — Telehealth: Payer: Self-pay | Admitting: Cardiology

## 2018-05-26 DIAGNOSIS — I10 Essential (primary) hypertension: Secondary | ICD-10-CM | POA: Diagnosis not present

## 2018-05-26 MED FILL — EZETIMIBE 10 MG TABS: 10 | 90 days supply | Qty: 90 | Fill #0

## 2018-05-26 NOTE — Telephone Encounter (Signed)
Phoned patient, informed that Dr. Agustin Cree wants him  to go to [facility] clinic to get lab drawn before 5. Explained to call office from pkg lot, they will come see him at his car, perform covid screening and brinig him in to lab. Cancelled his 06/17/18 appt and moved up to 06/03/18 at 0800 virtual visit with Dr. Agustin Cree, pt aware and verbalizes understanding of above.

## 2018-05-26 NOTE — Telephone Encounter (Signed)
Can we get his troponin I and schedule f/up vivit within 2 weeks

## 2018-05-26 NOTE — Addendum Note (Signed)
Addended by: Polly Cobia A on: 05/26/2018 04:05 PM   Modules accepted: Orders

## 2018-05-26 NOTE — Telephone Encounter (Signed)
Patient called stating that he has Left chest discomfort and some right sided discomfort when lying down or exerting himself.  Feels like "clogged filter"  Called to see if this is related to his Beryle Flock that he received last week.  Dr. Marin Olp notified.  Dr. Marin Olp wants patient to call his cardiologist about this.  Patient is in agreement.  Will call today.

## 2018-05-26 NOTE — Telephone Encounter (Signed)
Patient reports no chest discomfort presently. He does report  This morning having an episode of 4-5/10 level of squeezing pain in his chest. Previously he has felt this on the left side and not as bad on the right side of his chest however, today it felt about equal on both sides.     On recent visit to ER for chest pain, he was given 1 sublingual nitroglycerin which relieved the chest pain at that time. He does not have nitroglycerin prescribed.     He denies shortness of breath or edema. Last vital signs 128/73, HR 88 and he denies weight gain.     Patient is s/p 4 rounds of chemo and now undergoing maintenance therapy at 1 treatment every 3 weeks. He has a port in his right chest for the treatments.   pls advise, tx

## 2018-05-26 NOTE — Telephone Encounter (Signed)
Patient is experiencing chest discomfort.  He just finished chemo and started a maintenance drug so he called his oncologist to see if that could be causing the pain and he did not think so.  Transferred to Gregary Signs to discuss

## 2018-05-27 ENCOUNTER — Ambulatory Visit: Payer: Medicare Other

## 2018-05-27 ENCOUNTER — Ambulatory Visit (HOSPITAL_COMMUNITY): Payer: Medicare Other

## 2018-05-27 ENCOUNTER — Ambulatory Visit: Payer: Medicare Other | Admitting: Hematology & Oncology

## 2018-05-27 ENCOUNTER — Other Ambulatory Visit: Payer: Medicare Other

## 2018-05-27 LAB — TROPONIN I: Troponin I: <0.01 ng/mL (ref 0.00–0.04)

## 2018-05-28 NOTE — Telephone Encounter (Signed)
Phoned patient, states he is doing fine, informed lab result in normal range, understands he will be following up with Dr. Agustin Cree 06/03/18. No questions or concerns

## 2018-06-03 ENCOUNTER — Encounter: Payer: Self-pay | Admitting: Cardiology

## 2018-06-03 ENCOUNTER — Telehealth (INDEPENDENT_AMBULATORY_CARE_PROVIDER_SITE_OTHER): Payer: Medicare Other | Admitting: Cardiology

## 2018-06-03 ENCOUNTER — Other Ambulatory Visit: Payer: Self-pay

## 2018-06-03 VITALS — BP 123/70 | HR 84

## 2018-06-03 DIAGNOSIS — H524 Presbyopia: Secondary | ICD-10-CM | POA: Diagnosis not present

## 2018-06-03 DIAGNOSIS — I10 Essential (primary) hypertension: Secondary | ICD-10-CM

## 2018-06-03 DIAGNOSIS — H52223 Regular astigmatism, bilateral: Secondary | ICD-10-CM | POA: Diagnosis not present

## 2018-06-03 DIAGNOSIS — H5203 Hypermetropia, bilateral: Secondary | ICD-10-CM | POA: Diagnosis not present

## 2018-06-03 DIAGNOSIS — H25813 Combined forms of age-related cataract, bilateral: Secondary | ICD-10-CM | POA: Diagnosis not present

## 2018-06-03 DIAGNOSIS — R0789 Other chest pain: Secondary | ICD-10-CM | POA: Diagnosis not present

## 2018-06-03 DIAGNOSIS — C7B8 Other secondary neuroendocrine tumors: Secondary | ICD-10-CM

## 2018-06-03 DIAGNOSIS — C7951 Secondary malignant neoplasm of bone: Secondary | ICD-10-CM

## 2018-06-03 MED ORDER — NITROGLYCERIN 0.4 MG SL SUBL: 0.4000 mg | SUBLINGUAL_TABLET | SUBLINGUAL | 11 refills | Status: DC | PRN

## 2018-06-03 MED ORDER — ISOSORBIDE MONONITRATE ER 30 MG PO TB24: 30.0000 mg | ORAL_TABLET | Freq: Every day | ORAL | 1 refills | Status: DC

## 2018-06-03 MED FILL — NITROGLYCERIN 0.4 MG TAB SL: 0.4 | 7 days supply | Qty: 25 | Fill #0

## 2018-06-03 MED FILL — ISOSORBIDE MN ER 30 MG TAB: 30 | 90 days supply | Qty: 90 | Fill #0

## 2018-06-03 NOTE — Addendum Note (Signed)
Addended by: Ashok Norris on: 06/03/2018 08:59 AM   Modules accepted: Orders

## 2018-06-03 NOTE — Patient Instructions (Signed)
Medication Instructions:  Your physician has recommended you make the following change in your medication:   Start: imdur 30 mg daily  Take for chest pain if needed: Nitroglycerin 0.4 mg sublingual (under your tongue) as needed for chest pain. If experiencing chest pain, stop what you are doing and sit down. Take 1 nitroglycerin and wait 5 minutes. If chest pain continues, take another nitroglycerin and wait 5 minutes. If chest pain does not subside, take 1 more nitroglycerin and dial 911. You make take a total of 3 nitroglycerin in a 15 minute time frame.  If you need a refill on your cardiac medications before your next appointment, please call your pharmacy.   Lab work: None.  If you have labs (blood work) drawn today and your tests are completely normal, you will receive your results only by: Marland Kitchen MyChart Message (if you have MyChart) OR . A paper copy in the mail If you have any lab test that is abnormal or we need to change your treatment, we will call you to review the results.  Testing/Procedures: None.   Follow-Up: At [facility], you and your health needs are our priority.  As part of our continuing mission to provide you with exceptional heart care, we have created designated Provider Care Teams.  These Care Teams include your primary Cardiologist (physician) and Advanced Practice Providers (APPs -  Physician Assistants and Nurse Practitioners) who all work together to provide you with the care you need, when you need it. You will need a follow up appointment in 6 weeks.  Please call our office 2 months in advance to schedule this appointment.  You may see No primary care provider on file. or another member of our [employer] Provider Team in [place]: Shirlee More, MD . Jyl Heinz, MD  Any Other Special Instructions Will Be Listed Below (If Applicable).  Isosorbide Mononitrate extended-release tablets What is this medicine? ISOSORBIDE MONONITRATE (eye soe SOR bide mon  oh NYE trate) is a vasodilator. It relaxes blood vessels, increasing the blood and oxygen supply to your heart. This medicine is used to prevent chest pain caused by angina. It will not help to stop an episode of chest pain. This medicine may be used for other purposes; ask your health care provider or pharmacist if you have questions. COMMON BRAND NAME(S): Imdur, Isotrate ER What should I tell my health care provider before I take this medicine? They need to know if you have any of these conditions: -previous heart attack or heart failure -an unusual or allergic reaction to isosorbide mononitrate, nitrates, other medicines, foods, dyes, or preservatives -pregnant or trying to get pregnant -breast-feeding How should I use this medicine? Take this medicine by mouth with a glass of water. Follow the directions on the prescription label. Do not crush or chew. Take your medicine at regular intervals. Do not take your medicine more often than directed. Do not stop taking this medicine except on the advice of your doctor or health care professional. Talk to your pediatrician regarding the use of this medicine in children. Special care may be needed. Overdosage: If you think you have taken too much of this medicine contact a poison control center or emergency room at once. NOTE: This medicine is only for you. Do not share this medicine with others. What if I miss a dose? If you miss a dose, take it as soon as you can. If it is almost time for your next dose, take only that dose. Do not  take double or extra doses. What may interact with this medicine? Do not take this medicine with any of the following medications: -medicines used to treat erectile dysfunction (ED) like avanafil, sildenafil, tadalafil, and vardenafil -riociguat This medicine may also interact with the following medications: -medicines for high blood pressure -other medicines for angina or heart failure This list may not describe all  possible interactions. Give your health care provider a list of all the medicines, herbs, non-prescription drugs, or dietary supplements you use. Also tell them if you smoke, drink alcohol, or use illegal drugs. Some items may interact with your medicine. What should I watch for while using this medicine? Check your heart rate and blood pressure regularly while you are taking this medicine. Ask your doctor or health care professional what your heart rate and blood pressure should be and when you should contact him or her. Tell your doctor or health care professional if you feel your medicine is no longer working. You may get dizzy. Do not drive, use machinery, or do anything that needs mental alertness until you know how this medicine affects you. To reduce the risk of dizzy or fainting spells, do not sit or stand up quickly, especially if you are an older patient. Alcohol can make you more dizzy, and increase flushing and rapid heartbeats. Avoid alcoholic drinks. Do not treat yourself for coughs, colds, or pain while you are taking this medicine without asking your doctor or health care professional for advice. Some ingredients may increase your blood pressure. What side effects may I notice from receiving this medicine? Side effects that you should report to your doctor or health care professional as soon as possible: -bluish discoloration of lips, fingernails, or palms of hands -irregular heartbeat, palpitations -low blood pressure -nausea, vomiting -persistent headache -unusually weak or tired Side effects that usually do not require medical attention (report to your doctor or health care professional if they continue or are bothersome): -flushing of the face or neck -rash This list may not describe all possible side effects. Call your doctor for medical advice about side effects. You may report side effects to FDA at 1-800-FDA-1088. Where should I keep my medicine? Keep out of the reach of  children. Store between 15 and 30 degrees C (59 and 86 degrees F). Keep container tightly closed. Throw away any unused medicine after the expiration date. NOTE: This sheet is a summary. It may not cover all possible information. If you have questions about this medicine, talk to your doctor, pharmacist, or health care provider.  2019 Elsevier/Gold Standard (2012-10-31 14:48:19)   Nitroglycerin sublingual tablets What is this medicine? NITROGLYCERIN (nye troe GLI ser in) is a type of vasodilator. It relaxes blood vessels, increasing the blood and oxygen supply to your heart. This medicine is used to relieve chest pain caused by angina. It is also used to prevent chest pain before activities like climbing stairs, going outdoors in cold weather, or sexual activity. This medicine may be used for other purposes; ask your health care provider or pharmacist if you have questions. COMMON BRAND NAME(S): Nitroquick, Nitrostat, Nitrotab What should I tell my health care provider before I take this medicine? They need to know if you have any of these conditions: -anemia -head injury, recent stroke, or bleeding in the brain -liver disease -previous heart attack -an unusual or allergic reaction to nitroglycerin, other medicines, foods, dyes, or preservatives -pregnant or trying to get pregnant -breast-feeding How should I use this medicine? Take this medicine  by mouth as needed. At the first sign of an angina attack (chest pain or tightness) place one tablet under your tongue. You can also take this medicine 5 to 10 minutes before an event likely to produce chest pain. Follow the directions on the prescription label. Let the tablet dissolve under the tongue. Do not swallow whole. Replace the dose if you accidentally swallow it. It will help if your mouth is not dry. Saliva around the tablet will help it to dissolve more quickly. Do not eat or drink, smoke or chew tobacco while a tablet is dissolving. If you  are not better within 5 minutes after taking ONE dose of nitroglycerin, call 9-1-1 immediately to seek emergency medical care. Do not take more than 3 nitroglycerin tablets over 15 minutes. If you take this medicine often to relieve symptoms of angina, your doctor or health care professional may provide you with different instructions to manage your symptoms. If symptoms do not go away after following these instructions, it is important to call 9-1-1 immediately. Do not take more than 3 nitroglycerin tablets over 15 minutes. Talk to your pediatrician regarding the use of this medicine in children. Special care may be needed. Overdosage: If you think you have taken too much of this medicine contact a poison control center or emergency room at once. NOTE: This medicine is only for you. Do not share this medicine with others. What if I miss a dose? This does not apply. This medicine is only used as needed. What may interact with this medicine? Do not take this medicine with any of the following medications: -certain migraine medicines like ergotamine and dihydroergotamine (DHE) -medicines used to treat erectile dysfunction like sildenafil, tadalafil, and vardenafil -riociguat This medicine may also interact with the following medications: -alteplase -aspirin -heparin -medicines for high blood pressure -medicines for mental depression -other medicines used to treat angina -phenothiazines like chlorpromazine, mesoridazine, prochlorperazine, thioridazine This list may not describe all possible interactions. Give your health care provider a list of all the medicines, herbs, non-prescription drugs, or dietary supplements you use. Also tell them if you smoke, drink alcohol, or use illegal drugs. Some items may interact with your medicine. What should I watch for while using this medicine? Tell your doctor or health care professional if you feel your medicine is no longer working. Keep this medicine  with you at all times. Sit or lie down when you take your medicine to prevent falling if you feel dizzy or faint after using it. Try to remain calm. This will help you to feel better faster. If you feel dizzy, take several deep breaths and lie down with your feet propped up, or bend forward with your head resting between your knees. You may get drowsy or dizzy. Do not drive, use machinery, or do anything that needs mental alertness until you know how this drug affects you. Do not stand or sit up quickly, especially if you are an older patient. This reduces the risk of dizzy or fainting spells. Alcohol can make you more drowsy and dizzy. Avoid alcoholic drinks. Do not treat yourself for coughs, colds, or pain while you are taking this medicine without asking your doctor or health care professional for advice. Some ingredients may increase your blood pressure. What side effects may I notice from receiving this medicine? Side effects that you should report to your doctor or health care professional as soon as possible: -blurred vision -dry mouth -skin rash -sweating -the feeling of extreme pressure in  the head -unusually weak or tired Side effects that usually do not require medical attention (report to your doctor or health care professional if they continue or are bothersome): -flushing of the face or neck -headache -irregular heartbeat, palpitations -nausea, vomiting This list may not describe all possible side effects. Call your doctor for medical advice about side effects. You may report side effects to FDA at 1-800-FDA-1088. Where should I keep my medicine? Keep out of the reach of children. Store at room temperature between 20 and 25 degrees C (68 and 77 degrees F). Store in [employer] of [employer]. Protect from light and moisture. Keep tightly closed. Throw away any unused medicine after the expiration date. NOTE: This sheet is a summary. It may not cover all possible information. If you have  questions about this medicine, talk to your doctor, pharmacist, or health care provider.  2019 Elsevier/Gold Standard (2012-10-30 17:57:36)

## 2018-06-03 NOTE — Progress Notes (Signed)
Virtual Visit via Video Note   This visit type was conducted due to national recommendations for restrictions regarding the COVID-19 Pandemic (e.g. social distancing) in an effort to limit this patient's exposure and mitigate transmission in our community.  Due to his co-morbid illnesses, this patient is at least at moderate risk for complications without adequate follow up.  This format is felt to be most appropriate for this patient at this time.  All issues noted in this document were discussed and addressed.  A limited physical exam was performed with this format.  Please refer to the patient's chart for his consent to telehealth for [facility].  Evaluation Performed:  Follow-up visit  This visit type was conducted due to national recommendations for restrictions regarding the COVID-19 Pandemic (e.g. social distancing).  This format is felt to be most appropriate for this patient at this time.  All issues noted in this document were discussed and addressed.  No physical exam was performed (except for noted visual exam findings with Video Visits).  Please refer to the patient's chart (MyChart message for video visits and phone note for telephone visits) for the patient's consent to telehealth for [facility].  Date:  06/03/2018  ID: [name], DOB [birth year], MRN [id number]   Patient Location: [place] [address]   Provider location:   [place] Office  PCP:  Nicoletta Dress, MD  Cardiologist:  Jenne Campus, MD     Chief Complaint: I am doing well now but had a chest pain  History of Present Illness:    [name] is a 73 y.o. male  who presents via audio/video conferencing for a telehealth visit today.  Patient with metastatic cancer reported having some chest pain 2 days ago.  Chest pain happened at rest usually he developed chest pain after the infusion of his medication for cancer.  Usually dose pains last for about 2 days  those are on and off no provoking no relieving factors.  Usually happening at rest actually he said when he lay down on the left side appears to be a little more pronounced that when he is laying on the right.  Those episodes can last from seconds to minutes.  At the same time he is able to walk climb stairs with no chest pain.  Overall the characteristic of the pain look very atypical for cardiac pain however when he eventually ended going to the emergency room he was given nitroglycerin nitroglycerin completely almost immediately relieved the pain.  Therefore, I think is able to try long-acting nitroglycerin.  I will send him prescription for Imdur 30 mg daily also nitroglycerin as needed.  Previously long time ago he had a stress test which was mildly abnormal however he he did not have any symptoms therefore medical management was continued.  Now with facing the situation he may be having reactivation of the problem.  I will try to manage this medically because of his comorbidity if we fail then will be forced to do stress test trying to establish if he does have some significant coronary artery disease.   The patient does not have symptoms concerning for COVID-19 infection (fever, chills, cough, or new SHORTNESS OF BREATH).    Prior CV studies:   The following studies were reviewed today:       Past Medical History:  Diagnosis Date  . Complication of anesthesia   . Constipation    due to pain medication  .  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 Dyspnea    with exertion  . Essential hypertension 07/12/2014  . Goals of care, counseling/discussion 01/29/2018  . High blood sugar   . 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 PONV (postoperative nausea and vomiting)   . 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 Radiation 04/22/14-05/06/14   right sacrum 30 gray  . 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 CIRCUMCISION     when patient was 73 years old  . COLONOSCOPY    . COLONOSCOPY W/ POLYPECTOMY    . ESOPHAGOGASTRODUODENOSCOPY    . HAND SURGERY Left    fracture  . IR IMAGING GUIDED PORT INSERTION  02/10/2018  . LUMBAR LAMINECTOMY/DECOMPRESSION MICRODISCECTOMY Right 09/12/2016   Procedure: LAMINECTOMY AND FORAMINOTOMY RIGHT LUMBAR FIVE- SACRAL ONE, SACRAL ONE- SACRAL TWO DECOMPRESSION OF RIGHT SACRAL ONE NERVE ROOT;  Surgeon: [name], MD;  Location: [place];  Service: Neurosurgery;  Laterality: Right;  . ROBOT ASSISTED LAPAROSCOPIC RADICAL PROSTATECTOMY    . TONSILECTOMY/ADENOIDECTOMY WITH MYRINGOTOMY       Current Meds  Medication Sig  . acetaminophen (TYLENOL) 500 MG tablet Take 1,000 mg by mouth every 4 (four) hours as needed for moderate pain.  Marland Kitchen aspirin 81 MG tablet Take 81 mg by mouth daily.  Marland Kitchen CALCIUM-IRON-VIT D-VIT K PO Take 1 tablet by mouth 2 (two) times daily.  Marland Kitchen gabapentin (NEURONTIN) 300 MG capsule TAKE 1 CAPSULE BY MOUTH TWICE A DAY THEN TAKE 2 CAPSULES AT BEDTIME (Patient taking differently: Take 600 mg by mouth 2 (two) times daily. )  . ondansetron (ZOFRAN) 8 MG tablet Take 1 tablet (8 mg total) by mouth 2 (two) times daily as needed for nausea or vomiting.  Marland Kitchen OVER THE COUNTER MEDICATION every morning. Juice Plus -- 8 capsule of each the garden, vineyard, and orchard twice a day.  Marland Kitchen ZETIA 10 MG tablet Take 10 mg by mouth daily.       Family History: The patient's family history includes Heart disease in his father and mother; Prostate cancer in his father.   ROS:   Please see the history of present illness.     All other systems reviewed and are negative.   Labs/Other Tests and Data Reviewed:     Recent Labs: 04/07/2018: TSH 2.956 05/19/2018: ALT 14; BUN 15; Creatinine 1.11; Hemoglobin 8.4; Platelet Count 247; Potassium 4.0; Sodium 139  Recent Lipid Panel    Component Value  Date/Time   CHOL 230 (H) 03/23/2018 1847   TRIG 209 (H) 03/23/2018 1847   HDL 46 03/23/2018 1847   CHOLHDL 5.0 03/23/2018 1847   VLDL 42 (H) 03/23/2018 1847   LDLCALC 142 (H) 03/23/2018 1847      Exam:    Vital Signs:  BP 123/70   Pulse 84     Wt Readings from Last 3 Encounters:  05/19/18 151 lb (68.5 kg)  05/05/18 151 lb (68.5 kg)  04/28/18 150 lb (68 kg)     Well nourished, well developed in no acute distress. Alert awake oriented x3 with talking to the video conferencing he is very happy to be able to talk to me comfortable without any distress  Diagnosis for this visit:   1. Other chest pain   2. Essential hypertension   3. 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 Cancer,  metastatic to bone [facility])      ASSESSMENT & PLAN:    1.  Chest pain that got very atypical characteristic but at the same time interestingly he responded nicely to nitroglycerin.  Therefore, I will start Imdur 30 as well as nitroglycerin as needed.  I talked to him again in about 6 weeks to see how he does if he will still have recurrences of pain then will be forced to do stress testing. 2.  Essential hypertension blood pressure well controlled. 3.  Secondary new recurrent tumor with metastasis.  Responding very nicely to immunotherapy.  Followed by oncology team.like always provide excellent care  COVID-19 Education: The signs and symptoms of COVID-19 were discussed with the patient and how to seek care for testing (follow up with PCP or arrange E-visit).  The importance of social distancing was discussed today.  Patient Risk:   After full review of this patients clinical status, I feel that they are at least moderate risk at this time.  Time:   Today, I have spent 17 minutes with the patient with telehealth technology discussing pt health issues.  I spent 5 minutes reviewing her chart before the visit.  Visit was finished at 8:31 AM.    Medication Adjustments/Labs and  Tests Ordered: Current medicines are reviewed at length with the patient today.  Concerns regarding medicines are outlined above.  No orders of the defined types were placed in this encounter.  Medication changes: No orders of the defined types were placed in this encounter.    Disposition: Follow-up 6 weeks  Signed, Park Liter, MD, [facility] 06/03/2018 8:32 AM    [place]

## 2018-06-10 ENCOUNTER — Inpatient Hospital Stay (HOSPITAL_BASED_OUTPATIENT_CLINIC_OR_DEPARTMENT_OTHER): Payer: Medicare Other | Admitting: Hematology & Oncology

## 2018-06-10 ENCOUNTER — Inpatient Hospital Stay: Payer: Medicare Other

## 2018-06-10 ENCOUNTER — Other Ambulatory Visit: Payer: Self-pay | Admitting: Hematology & Oncology

## 2018-06-10 ENCOUNTER — Encounter: Payer: Self-pay | Admitting: Hematology & Oncology

## 2018-06-10 ENCOUNTER — Other Ambulatory Visit: Payer: Self-pay

## 2018-06-10 VITALS — BP 145/88 | HR 84 | Temp 97.6°F | Resp 18 | Wt 150.0 lb

## 2018-06-10 DIAGNOSIS — R531 Weakness: Secondary | ICD-10-CM

## 2018-06-10 DIAGNOSIS — Z5112 Encounter for antineoplastic immunotherapy: Secondary | ICD-10-CM | POA: Diagnosis not present

## 2018-06-10 DIAGNOSIS — C7951 Secondary malignant neoplasm of bone: Secondary | ICD-10-CM

## 2018-06-10 DIAGNOSIS — Z79899 Other long term (current) drug therapy: Secondary | ICD-10-CM

## 2018-06-10 DIAGNOSIS — Z7982 Long term (current) use of aspirin: Secondary | ICD-10-CM

## 2018-06-10 DIAGNOSIS — C7A8 Other malignant neuroendocrine tumors: Secondary | ICD-10-CM | POA: Diagnosis not present

## 2018-06-10 DIAGNOSIS — C61 Malignant neoplasm of prostate: Secondary | ICD-10-CM | POA: Diagnosis not present

## 2018-06-10 DIAGNOSIS — M2550 Pain in unspecified joint: Secondary | ICD-10-CM | POA: Diagnosis not present

## 2018-06-10 LAB — CMP (CANCER CENTER ONLY)
ALT: 10 U/L (ref 0–44)
AST: 14 U/L — ABNORMAL LOW (ref 15–41)
Albumin: 4.5 g/dL (ref 3.5–5.0)
Alkaline Phosphatase: 60 U/L (ref 38–126)
Anion gap: 11 (ref 5–15)
BUN: 25 mg/dL — ABNORMAL HIGH (ref 8–23)
CO2: 27 mmol/L (ref 22–32)
Calcium: 9.8 mg/dL (ref 8.9–10.3)
Chloride: 100 mmol/L (ref 98–111)
Creatinine: 1.09 mg/dL (ref 0.61–1.24)
GFR, Est AFR Am: >60 mL/min (ref >60)
GFR, Estimated: >60 mL/min (ref >60)
Glucose, Bld: 100 mg/dL — ABNORMAL HIGH (ref 70–99)
Potassium: 4 mmol/L (ref 3.5–5.1)
Sodium: 138 mmol/L (ref 135–145)
Total Bilirubin: 0.3 mg/dL (ref 0.3–1.2)
Total Protein: 6.7 g/dL (ref 6.5–8.1)

## 2018-06-10 LAB — CBC WITH DIFFERENTIAL (CANCER CENTER ONLY)
Abs Immature Granulocytes: 0.02 10*3/uL (ref 0.00–0.07)
Basophils Absolute: 0.1 10*3/uL (ref 0.0–0.1)
Basophils Relative: 1 %
Eosinophils Absolute: 0.3 10*3/uL (ref 0.0–0.5)
Eosinophils Relative: 5 %
HCT: 32.1 % — ABNORMAL LOW (ref 39.0–52.0)
Hemoglobin: 10.3 g/dL — ABNORMAL LOW (ref 13.0–17.0)
Immature Granulocytes: 0 %
Lymphocytes Relative: 15 %
Lymphs Abs: 1 10*3/uL (ref 0.7–4.0)
MCH: 33.9 pg (ref 26.0–34.0)
MCHC: 32.1 g/dL (ref 30.0–36.0)
MCV: 105.6 fL — ABNORMAL HIGH (ref 80.0–100.0)
Monocytes Absolute: 0.7 10*3/uL (ref 0.1–1.0)
Monocytes Relative: 11 %
Neutro Abs: 4.6 10*3/uL (ref 1.7–7.7)
Neutrophils Relative %: 68 %
Platelet Count: 310 10*3/uL (ref 150–400)
RBC: 3.04 MIL/uL — ABNORMAL LOW (ref 4.22–5.81)
RDW: 14.8 % (ref 11.5–15.5)
WBC Count: 6.7 10*3/uL (ref 4.0–10.5)
nRBC: 0 % (ref 0.0–0.2)

## 2018-06-10 MED ORDER — SODIUM CHLORIDE 0.9 % IV SOLN
200.0000 mg | Freq: Once | INTRAVENOUS | Status: AC
  Administered 2018-06-10: 15:00:00 200 mg via INTRAVENOUS
  Filled 2018-06-10: qty 8

## 2018-06-10 MED ORDER — SODIUM CHLORIDE 0.9% FLUSH
10.0000 mL | INTRAVENOUS | Status: DC | PRN
  Administered 2018-06-10: 16:00:00 10 mL
  Filled 2018-06-10: qty 10

## 2018-06-10 MED ORDER — SODIUM CHLORIDE 0.9 % IV SOLN
Freq: Once | INTRAVENOUS | Status: AC
  Administered 2018-06-10: 15:00:00 via INTRAVENOUS
  Filled 2018-06-10: qty 250

## 2018-06-10 MED ORDER — HEPARIN SOD (PORK) LOCK FLUSH 100 UNIT/ML IV SOLN
500.0000 [IU] | Freq: Once | INTRAVENOUS | Status: AC | PRN
  Administered 2018-06-10: 16:00:00 500 [IU]
  Filled 2018-06-10: qty 5

## 2018-06-10 MED FILL — GABAPENTIN 300 MG CAPSULE: 300 | 30 days supply | Qty: 120 | Fill #0

## 2018-06-10 NOTE — Patient Instructions (Signed)
Pembrolizumab injection  What is this medicine?  PEMBROLIZUMAB (pem broe liz ue mab) is a monoclonal antibody. It is used to treat cervical cancer, esophageal cancer, head and neck cancer, hepatocellular cancer, Hodgkin lymphoma, kidney cancer, lymphoma, melanoma, Merkel cell carcinoma, lung cancer, stomach cancer, urothelial cancer, and cancers that have a certain genetic condition.  This medicine may be used for other purposes; ask your health care provider or pharmacist if you have questions.  COMMON BRAND NAME(S): Keytruda  What should I tell my health care provider before I take this medicine?  They need to know if you have any of these conditions:  -diabetes  -immune system problems  -inflammatory bowel disease  -liver disease  -lung or breathing disease  -lupus  -received or scheduled to receive an organ transplant or a stem-cell transplant that uses donor stem cells  -an unusual or allergic reaction to pembrolizumab, other medicines, foods, dyes, or preservatives  -pregnant or trying to get pregnant  -breast-feeding  How should I use this medicine?  This medicine is for infusion into a vein. It is given by a health care professional in a hospital or clinic setting.  A special MedGuide will be given to you before each treatment. Be sure to read this information carefully each time.  Talk to your pediatrician regarding the use of this medicine in children. While this drug may be prescribed for selected conditions, precautions do apply.  Overdosage: If you think you have taken too much of this medicine contact a poison control center or emergency room at once.  NOTE: This medicine is only for you. Do not share this medicine with others.  What if I miss a dose?  It is important not to miss your dose. Call your doctor or health care professional if you are unable to keep an appointment.  What may interact with this medicine?  Interactions have not been studied.  Give your health care provider a list of all the  medicines, herbs, non-prescription drugs, or dietary supplements you use. Also tell them if you smoke, drink alcohol, or use illegal drugs. Some items may interact with your medicine.  This list may not describe all possible interactions. Give your health care provider a list of all the medicines, herbs, non-prescription drugs, or dietary supplements you use. Also tell them if you smoke, drink alcohol, or use illegal drugs. Some items may interact with your medicine.  What should I watch for while using this medicine?  Your condition will be monitored carefully while you are receiving this medicine.  You may need blood work done while you are taking this medicine.  Do not become pregnant while taking this medicine or for 4 months after stopping it. Women should inform their doctor if they wish to become pregnant or think they might be pregnant. There is a potential for serious side effects to an unborn child. Talk to your health care professional or pharmacist for more information. Do not breast-feed an infant while taking this medicine or for 4 months after the last dose.  What side effects may I notice from receiving this medicine?  Side effects that you should report to your doctor or health care professional as soon as possible:  -allergic reactions like skin rash, itching or hives, swelling of the face, lips, or tongue  -bloody or black, tarry  -breathing problems  -changes in vision  -chest pain  -chills  -confusion  -constipation  -cough  -diarrhea  -dizziness or feeling faint or lightheaded  -  fast or irregular heartbeat  -fever  -flushing  -hair loss  -joint pain  -low blood counts - this medicine may decrease the number of white blood cells, red blood cells and platelets. You may be at increased risk for infections and bleeding.  -muscle pain  -muscle weakness  -persistent headache  -redness, blistering, peeling or loosening of the skin, including inside the mouth  -signs and symptoms of high blood sugar  such as dizziness; dry mouth; dry skin; fruity breath; nausea; stomach pain; increased hunger or thirst; increased urination  -signs and symptoms of kidney injury like trouble passing urine or change in the amount of urine  -signs and symptoms of liver injury like dark urine, light-colored stools, loss of appetite, nausea, right upper belly pain, yellowing of the eyes or skin  -sweating  -swollen lymph nodes  -weight loss  Side effects that usually do not require medical attention (report to your doctor or health care professional if they continue or are bothersome):  -decreased appetite  -muscle pain  -tiredness  This list may not describe all possible side effects. Call your doctor for medical advice about side effects. You may report side effects to FDA at 1-800-FDA-1088.  Where should I keep my medicine?  This drug is given in a hospital or clinic and will not be stored at home.  NOTE: This sheet is a summary. It may not cover all possible information. If you have questions about this medicine, talk to your doctor, pharmacist, or health care provider.   2019 Elsevier/Gold Standard (2017-08-15 15:06:10)

## 2018-06-10 NOTE — Patient Instructions (Signed)
Implanted Port Insertion, Care After  This sheet gives you information about how to care for yourself after your procedure. Your health care provider may also give you more specific instructions. If you have problems or questions, contact your health care provider.  What can I expect after the procedure?  After the procedure, it is common to have:  · Discomfort at the port insertion site.  · Bruising on the skin over the port. This should improve over 3-4 days.  Follow these instructions at home:  Port care  · After your port is placed, you will get a manufacturer's information card. The card has information about your port. Keep this card with you at all times.  · Take care of the port as told by your health care provider. Ask your health care provider if you or a family member can get training for taking care of the port at home. A home health care nurse may also take care of the port.  · Make sure to remember what type of port you have.  Incision care         · Follow instructions from your health care provider about how to take care of your port insertion site. Make sure you:  ? Wash your hands with soap and water before and after you change your bandage (dressing). If soap and water are not available, use hand sanitizer.  ? Change your dressing as told by your health care provider.  ? Leave stitches (sutures), skin glue, or adhesive strips in place. These skin closures may need to stay in place for 2 weeks or longer. If adhesive strip edges start to loosen and curl up, you may trim the loose edges. Do not remove adhesive strips completely unless your health care provider tells you to do that.  · Check your port insertion site every day for signs of infection. Check for:  ? Redness, swelling, or pain.  ? Fluid or blood.  ? Warmth.  ? Pus or a bad smell.  Activity  · Return to your normal activities as told by your health care provider. Ask your health care provider what activities are safe for you.  · Do not  lift anything that is heavier than 10 lb (4.5 kg), or the limit that you are told, until your health care provider says that it is safe.  General instructions  · Take over-the-counter and prescription medicines only as told by your health care provider.  · Do not take baths, swim, or use a hot tub until your health care provider approves. Ask your health care provider if you may take showers. You may only be allowed to take sponge baths.  · Do not drive for 24 hours if you were given a sedative during your procedure.  · Wear a medical alert bracelet in case of an emergency. This will tell any health care providers that you have a port.  · Keep all follow-up visits as told by your health care provider. This is important.  Contact a health care provider if:  · You cannot flush your port with saline as directed, or you cannot draw blood from the port.  · You have a fever or chills.  · You have redness, swelling, or pain around your port insertion site.  · You have fluid or blood coming from your port insertion site.  · Your port insertion site feels warm to the touch.  · You have pus or a bad smell coming from the port   insertion site.  Get help right away if:  · You have chest pain or shortness of breath.  · You have bleeding from your port that you cannot control.  Summary  · Take care of the port as told by your health care provider. Keep the manufacturer's information card with you at all times.  · Change your dressing as told by your health care provider.  · Contact a health care provider if you have a fever or chills or if you have redness, swelling, or pain around your port insertion site.  · Keep all follow-up visits as told by your health care provider.  This information is not intended to replace advice given to you by your health care provider. Make sure you discuss any questions you have with your health care provider.  Document Released: 10/22/2012 Document Revised: 07/30/2017 Document Reviewed:  07/30/2017  Elsevier Interactive Patient Education © 2019 Elsevier Inc.

## 2018-06-10 NOTE — Progress Notes (Signed)
Hematology and Oncology Follow Up Visit  [name] [id number] 12-04-1945 73 y.o. 06/10/2018   Principle Diagnosis:  Metastatic small cell carcinoma --unknown primary  Metastatic Prostate cancer -- castrate sensitive   Current Therapy:    Zytiga 1000 mg by mouth daily - discontinued on 08/15/2016  Xtandi 160 mg po q day - start on 08/15/2016 - discontinued  Xgeva 120 mg subcutaneous Q3 months - due in July 2020  Lupron 22 mg IM every 3 months - due in July 2020  Radium-223 therapy -- s/p cycle #6  Palliative radiation to the right sacrum  Lutathera - s/p cycle 4/4 --last dose on 07/11/2017  Carbo/VP-16/Tecentriq --  S/p cycle #4  Keytruda q 3 week -- Maintanence -- s/p cycle #1 - started on 05/19/2018     Interim History:  [name] is back for follow-up.  He actually has done well with the first cycle of Keytruda.  His liver function studies are okay.  He is really had no specific complaints.  He has had no problems with cough or shortness of breath.  He does have the foot drop on the right foot which is chronic.  He has had no rashes.  He has had no bleeding.  There is been no headache.  He did have a nice Memorial Day weekend.  It was pretty quiet.    Overall, his performance status is ECOG 1.     Medications:  Current Outpatient Medications:  .  acetaminophen (TYLENOL) 500 MG tablet, Take 1,000 mg by mouth every 4 (four) hours as needed for moderate pain., Disp: , Rfl:  .  aspirin 81 MG tablet, Take 81 mg by mouth daily., Disp: , Rfl:  .  CALCIUM-IRON-VIT D-VIT K PO, Take 1 tablet by mouth 2 (two) times daily., Disp: , Rfl:  .  gabapentin (NEURONTIN) 300 MG capsule, TAKE 1 CAPSULE BY MOUTH TWICE A DAY THEN TAKE 2 CAPSULES AT BEDTIME (Patient taking differently: Take 600 mg by mouth 2 (two) times daily. ), Disp: 120 capsule, Rfl: 4 .  isosorbide mononitrate (IMDUR) 30 MG 24 hr tablet, Take 1 tablet (30 mg total) by mouth daily., Disp: 90 tablet, Rfl: 1 .   nitroGLYCERIN (NITROSTAT) 0.4 MG SL tablet, Place 1 tablet (0.4 mg total) under the tongue every 5 (five) minutes as needed for chest pain., Disp: 25 tablet, Rfl: 11 .  ondansetron (ZOFRAN) 8 MG tablet, Take 1 tablet (8 mg total) by mouth 2 (two) times daily as needed for nausea or vomiting., Disp: 20 tablet, Rfl: 0 .  OVER THE COUNTER MEDICATION, every morning. Juice Plus -- 8 capsule of each the garden, vineyard, and orchard twice a day., Disp: , Rfl:  .  ZETIA 10 MG tablet, Take 10 mg by mouth daily. , Disp: , Rfl:  No current facility-administered medications for this visit.   Facility-Administered Medications Ordered in Other Visits:  .  octreotide (SANDOSTATIN LAR) IM injection 30 mg, 30 mg, Intramuscular, Once, Shadeed Colberg, Rudell Cobb, MD  Allergies:  Allergies  Allergen Reactions  . Codeine Nausea And Vomiting  . Hydrocodone Nausea And Vomiting    Past Medical History, Surgical history, Social history, and Family History were reviewed and updated.  Review of Systems: Review of Systems  Constitutional: Negative.   HENT: Negative.   Eyes: Negative.   Respiratory: Negative.   Cardiovascular: Negative.   Gastrointestinal: Negative.   Genitourinary: Negative.  Negative for urgency.  Musculoskeletal: Positive for joint pain.  Skin: Negative.  Neurological: Positive for focal weakness.  Endo/Heme/Allergies: Negative.   Psychiatric/Behavioral: Negative.   All other systems reviewed and are negative.    Physical Exam:  weight is 150 lb (68 kg). His oral temperature is 97.6 F (36.4 C). His blood pressure is 145/88 (abnormal) and his pulse is 84. His respiration is 18 and oxygen saturation is 100%.   Physical Exam Vitals signs reviewed.  HENT:     Head: Normocephalic and atraumatic.  Eyes:     Pupils: Pupils are equal, round, and reactive to light.  Neck:     Musculoskeletal: Normal range of motion.  Cardiovascular:     Rate and Rhythm: Normal rate and regular rhythm.      Heart sounds: Normal heart sounds.  Pulmonary:     Effort: Pulmonary effort is normal.     Breath sounds: Normal breath sounds.  Abdominal:     General: Bowel sounds are normal.     Palpations: Abdomen is soft.  Musculoskeletal: Normal range of motion.        General: No tenderness or deformity.  Lymphadenopathy:     Cervical: No cervical adenopathy.  Skin:    General: Skin is warm and dry.     Findings: No erythema or rash.  Neurological:     Mental Status: He is alert and oriented to person, place, and time.  Psychiatric:        Behavior: Behavior normal.        Thought Content: Thought content normal.        Judgment: Judgment normal.      Lab Results  Component Value Date   WBC 6.7 06/10/2018   HGB 10.3 (L) 06/10/2018   HCT 32.1 (L) 06/10/2018   MCV 105.6 (H) 06/10/2018   PLT 310 06/10/2018     Chemistry      Component Value Date/Time   NA 138 06/10/2018 1200   NA 144 12/14/2016 1159   NA 140 10/22/2016 1129   K 4.0 06/10/2018 1200   K 3.5 12/14/2016 1159   K 4.3 10/22/2016 1129   CL 100 06/10/2018 1200   CL 103 12/14/2016 1159   CO2 27 06/10/2018 1200   CO2 30 12/14/2016 1159   CO2 27 10/22/2016 1129   BUN 25 (H) 06/10/2018 1200   BUN 16 12/14/2016 1159   BUN 14.0 10/22/2016 1129   CREATININE 1.09 06/10/2018 1200   CREATININE 1.3 (H) 12/14/2016 1159   CREATININE 1.1 10/22/2016 1129      Component Value Date/Time   CALCIUM 9.8 06/10/2018 1200   CALCIUM 10.0 12/14/2016 1159   CALCIUM 10.4 10/22/2016 1129   ALKPHOS 60 06/10/2018 1200   ALKPHOS 48 12/14/2016 1159   ALKPHOS 60 10/22/2016 1129   AST 14 (L) 06/10/2018 1200   AST 16 10/22/2016 1129   ALT 10 06/10/2018 1200   ALT 23 12/14/2016 1159   ALT 14 10/22/2016 1129   BILITOT 0.3 06/10/2018 1200   BILITOT 0.31 10/22/2016 1129         Impression and Plan: [name] is 73 year old gentleman with 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  He has had 4 cycles of  chemotherapy.  We had to omit immunotherapy after the second cycle.  His liver tests have normalized quite nicely.  We will continue him on the maintenance immunotherapy as long as he tolerates this and as long at his liver function studies are okay.  I have heard that the FDA might approve a 6-week Keytruda dose.  This I think would really be helpful.  He is not due for his next scan probably until mid July.  We will get him back in 3 weeks.      [name], MD 5/26/20202:12 PM

## 2018-06-17 ENCOUNTER — Telehealth: Payer: Medicare Other | Admitting: Cardiology

## 2018-06-30 ENCOUNTER — Encounter: Payer: Self-pay | Admitting: Hematology & Oncology

## 2018-06-30 ENCOUNTER — Inpatient Hospital Stay: Payer: Medicare Other

## 2018-06-30 ENCOUNTER — Inpatient Hospital Stay: Payer: Medicare Other | Attending: Hematology & Oncology | Admitting: Hematology & Oncology

## 2018-06-30 ENCOUNTER — Other Ambulatory Visit: Payer: Self-pay

## 2018-06-30 VITALS — BP 118/64 | HR 87 | Temp 97.9°F | Resp 18 | Wt 150.0 lb

## 2018-06-30 DIAGNOSIS — Z7982 Long term (current) use of aspirin: Secondary | ICD-10-CM | POA: Insufficient documentation

## 2018-06-30 DIAGNOSIS — C7951 Secondary malignant neoplasm of bone: Secondary | ICD-10-CM

## 2018-06-30 DIAGNOSIS — Z79899 Other long term (current) drug therapy: Secondary | ICD-10-CM | POA: Diagnosis not present

## 2018-06-30 DIAGNOSIS — C7A8 Other malignant neuroendocrine tumors: Secondary | ICD-10-CM | POA: Diagnosis not present

## 2018-06-30 DIAGNOSIS — Z5112 Encounter for antineoplastic immunotherapy: Secondary | ICD-10-CM | POA: Insufficient documentation

## 2018-06-30 DIAGNOSIS — Z5189 Encounter for other specified aftercare: Secondary | ICD-10-CM | POA: Diagnosis present

## 2018-06-30 DIAGNOSIS — C61 Malignant neoplasm of prostate: Secondary | ICD-10-CM | POA: Insufficient documentation

## 2018-06-30 LAB — CMP (CANCER CENTER ONLY)
ALT: 11 U/L (ref 0–44)
AST: 14 U/L — ABNORMAL LOW (ref 15–41)
Albumin: 4.5 g/dL (ref 3.5–5.0)
Alkaline Phosphatase: 52 U/L (ref 38–126)
Anion gap: 9 (ref 5–15)
BUN: 24 mg/dL — ABNORMAL HIGH (ref 8–23)
CO2: 29 mmol/L (ref 22–32)
Calcium: 9.8 mg/dL (ref 8.9–10.3)
Chloride: 101 mmol/L (ref 98–111)
Creatinine: 1.17 mg/dL (ref 0.61–1.24)
GFR, Est AFR Am: >60 mL/min (ref >60)
GFR, Estimated: >60 mL/min (ref >60)
Glucose, Bld: 83 mg/dL (ref 70–99)
Potassium: 4.1 mmol/L (ref 3.5–5.1)
Sodium: 139 mmol/L (ref 135–145)
Total Bilirubin: 0.3 mg/dL (ref 0.3–1.2)
Total Protein: 7 g/dL (ref 6.5–8.1)

## 2018-06-30 LAB — CBC WITH DIFFERENTIAL (CANCER CENTER ONLY)
Abs Immature Granulocytes: 0.01 10*3/uL (ref 0.00–0.07)
Basophils Absolute: 0 10*3/uL (ref 0.0–0.1)
Basophils Relative: 1 %
Eosinophils Absolute: 0.2 10*3/uL (ref 0.0–0.5)
Eosinophils Relative: 5 %
HCT: 34.1 % — ABNORMAL LOW (ref 39.0–52.0)
Hemoglobin: 11.2 g/dL — ABNORMAL LOW (ref 13.0–17.0)
Immature Granulocytes: 0 %
Lymphocytes Relative: 19 %
Lymphs Abs: 0.8 10*3/uL (ref 0.7–4.0)
MCH: 34.1 pg — ABNORMAL HIGH (ref 26.0–34.0)
MCHC: 32.8 g/dL (ref 30.0–36.0)
MCV: 104 fL — ABNORMAL HIGH (ref 80.0–100.0)
Monocytes Absolute: 0.5 10*3/uL (ref 0.1–1.0)
Monocytes Relative: 11 %
Neutro Abs: 2.8 10*3/uL (ref 1.7–7.7)
Neutrophils Relative %: 64 %
Platelet Count: 223 10*3/uL (ref 150–400)
RBC: 3.28 MIL/uL — ABNORMAL LOW (ref 4.22–5.81)
RDW: 13.4 % (ref 11.5–15.5)
WBC Count: 4.3 10*3/uL (ref 4.0–10.5)
nRBC: 0 % (ref 0.0–0.2)

## 2018-06-30 LAB — LACTATE DEHYDROGENASE: LDH: 168 U/L (ref 98–192)

## 2018-06-30 MED ORDER — SODIUM CHLORIDE 0.9 % IV SOLN
400.0000 mg | Freq: Once | INTRAVENOUS | Status: AC
  Administered 2018-06-30: 400 mg via INTRAVENOUS
  Filled 2018-06-30: qty 16

## 2018-06-30 MED ORDER — HEPARIN SOD (PORK) LOCK FLUSH 100 UNIT/ML IV SOLN
500.0000 [IU] | Freq: Once | INTRAVENOUS | Status: DC | PRN
  Filled 2018-06-30: qty 5

## 2018-06-30 MED ORDER — SODIUM CHLORIDE 0.9 % IV SOLN
Freq: Once | INTRAVENOUS | Status: AC
  Administered 2018-06-30: 11:00:00 via INTRAVENOUS
  Filled 2018-06-30: qty 250

## 2018-06-30 MED ORDER — SODIUM CHLORIDE 0.9% FLUSH
10.0000 mL | INTRAVENOUS | Status: DC | PRN
  Filled 2018-06-30: qty 10

## 2018-06-30 MED FILL — SOLIFENACIN SUCCINATE 5 MG: 5 | 30 days supply | Qty: 30 | Fill #0

## 2018-06-30 NOTE — Patient Instructions (Signed)
Pembrolizumab injection  What is this medicine?  PEMBROLIZUMAB (pem broe liz ue mab) is a monoclonal antibody. It is used to treat cervical cancer, esophageal cancer, head and neck cancer, hepatocellular cancer, Hodgkin lymphoma, kidney cancer, lymphoma, melanoma, Merkel cell carcinoma, lung cancer, stomach cancer, urothelial cancer, and cancers that have a certain genetic condition.  This medicine may be used for other purposes; ask your health care provider or pharmacist if you have questions.  COMMON BRAND NAME(S): Keytruda  What should I tell my health care provider before I take this medicine?  They need to know if you have any of these conditions:  -diabetes  -immune system problems  -inflammatory bowel disease  -liver disease  -lung or breathing disease  -lupus  -received or scheduled to receive an organ transplant or a stem-cell transplant that uses donor stem cells  -an unusual or allergic reaction to pembrolizumab, other medicines, foods, dyes, or preservatives  -pregnant or trying to get pregnant  -breast-feeding  How should I use this medicine?  This medicine is for infusion into a vein. It is given by a health care professional in a hospital or clinic setting.  A special MedGuide will be given to you before each treatment. Be sure to read this information carefully each time.  Talk to your pediatrician regarding the use of this medicine in children. While this drug may be prescribed for selected conditions, precautions do apply.  Overdosage: If you think you have taken too much of this medicine contact a poison control center or emergency room at once.  NOTE: This medicine is only for you. Do not share this medicine with others.  What if I miss a dose?  It is important not to miss your dose. Call your doctor or health care professional if you are unable to keep an appointment.  What may interact with this medicine?  Interactions have not been studied.  Give your health care provider a list of all the  medicines, herbs, non-prescription drugs, or dietary supplements you use. Also tell them if you smoke, drink alcohol, or use illegal drugs. Some items may interact with your medicine.  This list may not describe all possible interactions. Give your health care provider a list of all the medicines, herbs, non-prescription drugs, or dietary supplements you use. Also tell them if you smoke, drink alcohol, or use illegal drugs. Some items may interact with your medicine.  What should I watch for while using this medicine?  Your condition will be monitored carefully while you are receiving this medicine.  You may need blood work done while you are taking this medicine.  Do not become pregnant while taking this medicine or for 4 months after stopping it. Women should inform their doctor if they wish to become pregnant or think they might be pregnant. There is a potential for serious side effects to an unborn child. Talk to your health care professional or pharmacist for more information. Do not breast-feed an infant while taking this medicine or for 4 months after the last dose.  What side effects may I notice from receiving this medicine?  Side effects that you should report to your doctor or health care professional as soon as possible:  -allergic reactions like skin rash, itching or hives, swelling of the face, lips, or tongue  -bloody or black, tarry  -breathing problems  -changes in vision  -chest pain  -chills  -confusion  -constipation  -cough  -diarrhea  -dizziness or feeling faint or lightheaded  -  fast or irregular heartbeat  -fever  -flushing  -hair loss  -joint pain  -low blood counts - this medicine may decrease the number of white blood cells, red blood cells and platelets. You may be at increased risk for infections and bleeding.  -muscle pain  -muscle weakness  -persistent headache  -redness, blistering, peeling or loosening of the skin, including inside the mouth  -signs and symptoms of high blood sugar  such as dizziness; dry mouth; dry skin; fruity breath; nausea; stomach pain; increased hunger or thirst; increased urination  -signs and symptoms of kidney injury like trouble passing urine or change in the amount of urine  -signs and symptoms of liver injury like dark urine, light-colored stools, loss of appetite, nausea, right upper belly pain, yellowing of the eyes or skin  -sweating  -swollen lymph nodes  -weight loss  Side effects that usually do not require medical attention (report to your doctor or health care professional if they continue or are bothersome):  -decreased appetite  -muscle pain  -tiredness  This list may not describe all possible side effects. Call your doctor for medical advice about side effects. You may report side effects to FDA at 1-800-FDA-1088.  Where should I keep my medicine?  This drug is given in a hospital or clinic and will not be stored at home.  NOTE: This sheet is a summary. It may not cover all possible information. If you have questions about this medicine, talk to your doctor, pharmacist, or health care provider.   2019 Elsevier/Gold Standard (2017-08-15 15:06:10)

## 2018-06-30 NOTE — Progress Notes (Addendum)
Hematology and Oncology Follow Up Visit  [name] [id number] 1945-07-06 73 y.o. 06/30/2018   Principle Diagnosis:  Metastatic small cell carcinoma --unknown primary  Metastatic Prostate cancer -- castrate sensitive   Current Therapy:    Zytiga 1000 mg by mouth daily - discontinued on 08/15/2016  Xtandi 160 mg po q day - start on 08/15/2016 - discontinued  Xgeva 120 mg subcutaneous Q3 months - due in July 2020  Lupron 22 mg IM every 3 months - due in July 2020  Radium-223 therapy -- s/p cycle #6  Palliative radiation to the right sacrum  Lutathera - s/p cycle 4/4 --last dose on 07/11/2017  Carbo/VP-16/Tecentriq --  S/p cycle #4  Keytruda q 6 week -- Maintanence -- s/p cycle #2 - changed from 3 to 6 week on 06/30/2018     Interim History:  [name] is back for follow-up.  He looks great.  He feels good.  He really wants to go back to work.  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  I think it would really help his overall outlook if he was able to work.  Since the FDA just approved a 6-week Keytruda dose, we will try to make his appointments and treatments every 6 weeks.  I think is also would be good for him.  With the summertime, I know he and the [place] go to the ocean and he was to be a lot easier for him to plan this.  He has had no cough.  He has had no shortness of breath.  He has had no change in bowel or bladder habits.  Has had no bleeding.  His liver tests have remained quite good.  So far has had no issues with hepatic toxicity from the [facility].  There is been no rashes.  His right leg is doing okay.  He has the brace on.  He gets around with a cane.  Overall, his performance status is ECOG 1.     Medications:  Current Outpatient Medications:  .  acetaminophen (TYLENOL) 500 MG tablet, Take 1,000 mg by mouth every 4 (four) hours as needed for moderate pain., Disp: , Rfl:  .  aspirin 81 MG tablet, Take 81 mg by  mouth daily., Disp: , Rfl:  .  CALCIUM-IRON-VIT D-VIT K PO, Take 1 tablet by mouth 2 (two) times daily., Disp: , Rfl:  .  gabapentin (NEURONTIN) 300 MG capsule, TAKE 1 CAPSULE BY MOUTH TWICE A DAY THEN TAKE 2 CAPSULES AT BEDTIME (Patient taking differently: Take 600 mg by mouth 2 (two) times daily. ), Disp: 120 capsule, Rfl: 4 .  isosorbide mononitrate (IMDUR) 30 MG 24 hr tablet, Take 1 tablet (30 mg total) by mouth daily., Disp: 90 tablet, Rfl: 1 .  nitroGLYCERIN (NITROSTAT) 0.4 MG SL tablet, Place 1 tablet (0.4 mg total) under the tongue every 5 (five) minutes as needed for chest pain., Disp: 25 tablet, Rfl: 11 .  ondansetron (ZOFRAN) 8 MG tablet, Take 1 tablet (8 mg total) by mouth 2 (two) times daily as needed for nausea or vomiting., Disp: 20 tablet, Rfl: 0 .  OVER THE COUNTER MEDICATION, every morning. Juice Plus -- 8 capsule of each the garden, vineyard, and orchard twice a day., Disp: , Rfl:  .  solifenacin (VESICARE) 5 MG tablet, TAKE 1 TABLET BY MOUTH DAILY., Disp: 30 tablet, Rfl: 4 .  ZETIA 10 MG tablet, Take 10 mg by mouth daily. , Disp: ,  Rfl:  No current facility-administered medications for this visit.   Facility-Administered Medications Ordered in Other Visits:  .  octreotide (SANDOSTATIN LAR) IM injection 30 mg, 30 mg, Intramuscular, Once, , Rudell Cobb, MD  Allergies:  Allergies  Allergen Reactions  . Codeine Nausea And Vomiting  . Hydrocodone Nausea And Vomiting    Past Medical History, Surgical history, Social history, and Family History were reviewed and updated.  Review of Systems: Review of Systems  Constitutional: Negative.   HENT: Negative.   Eyes: Negative.   Respiratory: Negative.   Cardiovascular: Negative.   Gastrointestinal: Negative.   Genitourinary: Negative.  Negative for urgency.  Musculoskeletal: Positive for joint pain.  Skin: Negative.   Neurological: Positive for focal weakness.  Endo/Heme/Allergies: Negative.   Psychiatric/Behavioral:  Negative.   All other systems reviewed and are negative.    Physical Exam:  weight is 150 lb (68 kg). His oral temperature is 97.9 F (36.6 C). His blood pressure is 118/64 and his pulse is 87. His respiration is 18 and oxygen saturation is 100%.   Physical Exam Vitals signs reviewed.  HENT:     Head: Normocephalic and atraumatic.  Eyes:     Pupils: Pupils are equal, round, and reactive to light.  Neck:     Musculoskeletal: Normal range of motion.  Cardiovascular:     Rate and Rhythm: Normal rate and regular rhythm.     Heart sounds: Normal heart sounds.  Pulmonary:     Effort: Pulmonary effort is normal.     Breath sounds: Normal breath sounds.  Abdominal:     General: Bowel sounds are normal.     Palpations: Abdomen is soft.  Musculoskeletal: Normal range of motion.        General: No tenderness or deformity.  Lymphadenopathy:     Cervical: No cervical adenopathy.  Skin:    General: Skin is warm and dry.     Findings: No erythema or rash.  Neurological:     Mental Status: He is alert and oriented to person, place, and time.  Psychiatric:        Behavior: Behavior normal.        Thought Content: Thought content normal.        Judgment: Judgment normal.      Lab Results  Component Value Date   WBC 4.3 06/30/2018   HGB 11.2 (L) 06/30/2018   HCT 34.1 (L) 06/30/2018   MCV 104.0 (H) 06/30/2018   PLT 223 06/30/2018     Chemistry      Component Value Date/Time   NA 139 06/30/2018 0900   NA 144 12/14/2016 1159   NA 140 10/22/2016 1129   K 4.1 06/30/2018 0900   K 3.5 12/14/2016 1159   K 4.3 10/22/2016 1129   CL 101 06/30/2018 0900   CL 103 12/14/2016 1159   CO2 29 06/30/2018 0900   CO2 30 12/14/2016 1159   CO2 27 10/22/2016 1129   BUN 24 (H) 06/30/2018 0900   BUN 16 12/14/2016 1159   BUN 14.0 10/22/2016 1129   CREATININE 1.17 06/30/2018 0900   CREATININE 1.3 (H) 12/14/2016 1159   CREATININE 1.1 10/22/2016 1129      Component Value Date/Time   CALCIUM  9.8 06/30/2018 0900   CALCIUM 10.0 12/14/2016 1159   CALCIUM 10.4 10/22/2016 1129   ALKPHOS 52 06/30/2018 0900   ALKPHOS 48 12/14/2016 1159   ALKPHOS 60 10/22/2016 1129   AST 14 (L) 06/30/2018 0900   AST 16 10/22/2016 1129  ALT 11 06/30/2018 0900   ALT 23 12/14/2016 1159   ALT 14 10/22/2016 1129   BILITOT 0.3 06/30/2018 0900   BILITOT 0.31 10/22/2016 1129         Impression and Plan: [name] is 73 year old gentleman with 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  He has had 4 cycles of chemotherapy.  We had to omit immunotherapy after the second cycle.  His liver tests have normalized quite nicely.  We will continue him on the maintenance immunotherapy as long as he tolerates this and as long at his liver function studies are okay.  We went ahead and gave him the 6-week dose of Keytruda today.  I will now make his treatments every 6 weeks.  I probably would do another scan on him sometime in late July or August.  I am just happy that his quality of life is doing so well right now.   [name], MD 6/15/202010:17 AM

## 2018-06-30 NOTE — Patient Instructions (Signed)

## 2018-07-14 MED FILL — GABAPENTIN 300 MG CAPSULE: 300 | 30 days supply | Qty: 120 | Fill #1

## 2018-07-15 ENCOUNTER — Other Ambulatory Visit: Payer: Self-pay

## 2018-07-15 ENCOUNTER — Telehealth (INDEPENDENT_AMBULATORY_CARE_PROVIDER_SITE_OTHER): Payer: Medicare Other | Admitting: Cardiology

## 2018-07-15 ENCOUNTER — Encounter: Payer: Self-pay | Admitting: Cardiology

## 2018-07-15 VITALS — BP 120/73 | HR 84 | Wt 150.0 lb

## 2018-07-15 DIAGNOSIS — E119 Type 2 diabetes mellitus without complications: Secondary | ICD-10-CM

## 2018-07-15 DIAGNOSIS — E785 Hyperlipidemia, unspecified: Secondary | ICD-10-CM

## 2018-07-15 DIAGNOSIS — R072 Precordial pain: Secondary | ICD-10-CM

## 2018-07-15 DIAGNOSIS — I10 Essential (primary) hypertension: Secondary | ICD-10-CM

## 2018-07-15 NOTE — Patient Instructions (Signed)
Medication Instructions:  Your physician recommends that you continue on your current medications as directed. Please refer to the Current Medication list given to you today.  If you need a refill on your cardiac medications before your next appointment, please call your pharmacy.   Lab work: None.  If you have labs (blood work) drawn today and your tests are completely normal, you will receive your results only by: . MyChart Message (if you have MyChart) OR . A paper copy in the mail If you have any lab test that is abnormal or we need to change your treatment, we will call you to review the results.  Testing/Procedures: None.    Follow-Up: At [facility], you and your health needs are our priority.  As part of our continuing mission to provide you with exceptional heart care, we have created designated Provider Care Teams.  These Care Teams include your primary Cardiologist (physician) and Advanced Practice Providers (APPs -  Physician Assistants and Nurse Practitioners) who all work together to provide you with the care you need, when you need it. You will need a follow up appointment in 4 months.  Please call our office 2 months in advance to schedule this appointment.  You may see No primary care provider on file. or another member of our [facility] Provider Team in [place]: Brian Munley, MD . Rajan Revankar, MD  Any Other Special Instructions Will Be Listed Below (If Applicable).     

## 2018-07-15 NOTE — Progress Notes (Signed)
Virtual Visit via Video Note   This visit type was conducted due to national recommendations for restrictions regarding the COVID-19 Pandemic (e.g. social distancing) in an effort to limit this patient's exposure and mitigate transmission in our community.  Due to his co-morbid illnesses, this patient is at least at moderate risk for complications without adequate follow up.  This format is felt to be most appropriate for this patient at this time.  All issues noted in this document were discussed and addressed.  A limited physical exam was performed with this format.  Please refer to the patient's chart for his consent to telehealth for [facility].  Evaluation Performed:  Follow-up visit  This visit type was conducted due to national recommendations for restrictions regarding the COVID-19 Pandemic (e.g. social distancing).  This format is felt to be most appropriate for this patient at this time.  All issues noted in this document were discussed and addressed.  No physical exam was performed (except for noted visual exam findings with Video Visits).  Please refer to the patient's chart (MyChart message for video visits and phone note for telephone visits) for the patient's consent to telehealth for [facility].  Date:  07/15/2018  ID: [name], [name] Apr 24, 1945, MRN [id number]   Patient Location: [place] [address]   Provider location:   [place] Office  PCP:  Nicoletta Dress, MD  Cardiologist:  Jenne Campus, MD     Chief Complaint: I am doing very well  History of Present Illness:    [name] is a 73 y.o. male  who presents via audio/video conferencing for a telehealth visit today.  With chest pain and stress test done 2 years ago which was abnormal.  He also got metastatic cancer with metastasis to the bone, essential hypertension I spoke to him last time in May at that time he was complaining of atypical chest pain.  Pain  happened only today that he was getting his chemotherapy at the same time he was able to be active and walk around and have no difficulties.  He finished his chemotherapy and he does not have pain anymore.  He still works 3-1/2 days a week.  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  He said he is doing quite well in spite of heat of the summer.  Since I seen him last time he did not have any chest pain.  Overall doing very well.   The patient does not have symptoms concerning for COVID-19 infection (fever, chills, cough, or new SHORTNESS OF BREATH).    Prior CV studies:   The following studies were reviewed today:       Past Medical History:  Diagnosis Date   Complication of anesthesia    Constipation    due to pain medication   Diabetes mellitus without complication ([place])    Prednisone induced   Dyspnea    with exertion   Essential hypertension 07/12/2014   Goals of care, counseling/discussion 01/29/2018   High blood sugar    Neuroendocrine carcinoma of unknown origin ([place]) 12/14/2016   PONV (postoperative nausea and vomiting)    Prostate cancer ([place]) 07/23/2011   with mets to sacral, lungs   Radiation 04/22/14-05/06/14   right sacrum 30 gray   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    Past Surgical History:  Procedure Laterality Date   CIRCUMCISION     when patient  was 73 years old   COLONOSCOPY     COLONOSCOPY W/ POLYPECTOMY     ESOPHAGOGASTRODUODENOSCOPY     HAND SURGERY Left    fracture   IR IMAGING GUIDED PORT INSERTION  02/10/2018   LUMBAR LAMINECTOMY/DECOMPRESSION MICRODISCECTOMY Right 09/12/2016   Procedure: LAMINECTOMY AND FORAMINOTOMY RIGHT LUMBAR FIVE- SACRAL ONE, SACRAL ONE- SACRAL TWO DECOMPRESSION OF RIGHT SACRAL ONE NERVE ROOT;  Surgeon: [name], MD;  Location: [place];  Service: Neurosurgery;  Laterality: Right;   ROBOT ASSISTED LAPAROSCOPIC RADICAL PROSTATECTOMY      TONSILECTOMY/ADENOIDECTOMY WITH MYRINGOTOMY       Current Meds  Medication Sig   acetaminophen (TYLENOL) 500 MG tablet Take 1,000 mg by mouth every 4 (four) hours as needed for moderate pain.   aspirin 81 MG tablet Take 81 mg by mouth daily.   CALCIUM-IRON-VIT D-VIT K PO Take 1 tablet by mouth 2 (two) times daily.   gabapentin (NEURONTIN) 300 MG capsule TAKE 1 CAPSULE BY MOUTH TWICE A DAY THEN TAKE 2 CAPSULES AT BEDTIME (Patient taking differently: Take 600 mg by mouth 2 (two) times daily. )   isosorbide mononitrate (IMDUR) 30 MG 24 hr tablet Take 1 tablet (30 mg total) by mouth daily.   nitroGLYCERIN (NITROSTAT) 0.4 MG SL tablet Place 1 tablet (0.4 mg total) under the tongue every 5 (five) minutes as needed for chest pain.   ondansetron (ZOFRAN) 8 MG tablet Take 1 tablet (8 mg total) by mouth 2 (two) times daily as needed for nausea or vomiting.   OVER THE COUNTER MEDICATION every morning. Juice Plus -- 8 capsule of each the garden, vineyard, and orchard twice a day.   solifenacin (VESICARE) 5 MG tablet TAKE 1 TABLET BY MOUTH DAILY.   ZETIA 10 MG tablet Take 10 mg by mouth daily.       Family History: The patient's family history includes Heart disease in his father and mother; Prostate cancer in his father.   ROS:   Please see the history of present illness.     All other systems reviewed and are negative.   Labs/Other Tests and Data Reviewed:     Recent Labs: 04/07/2018: TSH 2.956 06/30/2018: ALT 11; BUN 24; Creatinine 1.17; Hemoglobin 11.2; Platelet Count 223; Potassium 4.1; Sodium 139  Recent Lipid Panel    Component Value Date/Time   CHOL 230 (H) 03/23/2018 1847   TRIG 209 (H) 03/23/2018 1847   HDL 46 03/23/2018 1847   CHOLHDL 5.0 03/23/2018 1847   VLDL 42 (H) 03/23/2018 1847   LDLCALC 142 (H) 03/23/2018 1847      Exam:    Vital Signs:  BP 120/73    Pulse 84    Wt 150 lb (68 kg)    BMI 22.81 kg/m     Wt Readings from Last 3 Encounters:  07/15/18 150  lb (68 kg)  06/30/18 150 lb (68 kg)  06/10/18 150 lb (68 kg)     Well nourished, well developed in no acute distress. Alert awake oriented x3 talking to me through the video link not in any distress just finished breakfast sitting in his home and in our office in hospital.  Diagnosis for this visit:   1. Precordial pain   2. Essential hypertension   3. Type 2 diabetes mellitus without complication, without long-term current use of insulin ([place])   4. Dyslipidemia      ASSESSMENT & PLAN:    1.  Precordial chest pain denies having any. 2.  Essential hypertension blood  pressure well controlled continue present management. 3.  Type 2 diabetes followed by internal medicine team. 4.  Dyslipidemia he is on Zetia only.  Will get copy of his fasting lipid profile 5.  Metastatic cancer to bone.  He finished chemotherapy doing very well feeling very well without any symptoms now.  COVID-19 Education: The signs and symptoms of COVID-19 were discussed with the patient and how to seek care for testing (follow up with PCP or arrange E-visit).  The importance of social distancing was discussed today.  Patient Risk:   After full review of this patients clinical status, I feel that they are at least moderate risk at this time.  Time:   Today, I have spent 16 minutes with the patient with telehealth technology discussing pt health issues.  I spent 5 minutes reviewing her chart before the visit.  Visit was finished at 8:20 AM.    Medication Adjustments/Labs and Tests Ordered: Current medicines are reviewed at length with the patient today.  Concerns regarding medicines are outlined above.  No orders of the defined types were placed in this encounter.  Medication changes: No orders of the defined types were placed in this encounter.    Disposition: Follow-up in 4 months  Signed, Park Liter, MD, [facility] 07/15/2018 8:21 AM    [place]

## 2018-07-21 ENCOUNTER — Other Ambulatory Visit: Payer: Medicare Other

## 2018-07-21 ENCOUNTER — Ambulatory Visit: Payer: Medicare Other

## 2018-07-21 ENCOUNTER — Ambulatory Visit: Payer: Medicare Other | Admitting: Hematology & Oncology

## 2018-07-29 MED FILL — SOLIFENACIN SUCCINATE 5 MG: 5 | 30 days supply | Qty: 30 | Fill #1

## 2018-08-11 ENCOUNTER — Inpatient Hospital Stay: Payer: Medicare Other

## 2018-08-11 ENCOUNTER — Encounter: Payer: Self-pay | Admitting: Hematology & Oncology

## 2018-08-11 ENCOUNTER — Other Ambulatory Visit: Payer: Self-pay

## 2018-08-11 ENCOUNTER — Inpatient Hospital Stay: Payer: Medicare Other | Attending: Hematology & Oncology | Admitting: Hematology & Oncology

## 2018-08-11 VITALS — BP 115/79 | HR 82 | Temp 97.9°F | Resp 20 | Wt 152.1 lb

## 2018-08-11 DIAGNOSIS — Z79899 Other long term (current) drug therapy: Secondary | ICD-10-CM | POA: Diagnosis not present

## 2018-08-11 DIAGNOSIS — C61 Malignant neoplasm of prostate: Secondary | ICD-10-CM

## 2018-08-11 DIAGNOSIS — C7A8 Other malignant neuroendocrine tumors: Secondary | ICD-10-CM

## 2018-08-11 DIAGNOSIS — C7951 Secondary malignant neoplasm of bone: Secondary | ICD-10-CM

## 2018-08-11 DIAGNOSIS — R531 Weakness: Secondary | ICD-10-CM | POA: Diagnosis not present

## 2018-08-11 DIAGNOSIS — Z7982 Long term (current) use of aspirin: Secondary | ICD-10-CM | POA: Diagnosis not present

## 2018-08-11 DIAGNOSIS — Z5112 Encounter for antineoplastic immunotherapy: Secondary | ICD-10-CM | POA: Diagnosis not present

## 2018-08-11 DIAGNOSIS — M2550 Pain in unspecified joint: Secondary | ICD-10-CM | POA: Insufficient documentation

## 2018-08-11 DIAGNOSIS — Z5189 Encounter for other specified aftercare: Secondary | ICD-10-CM | POA: Diagnosis present

## 2018-08-11 LAB — CBC WITH DIFFERENTIAL (CANCER CENTER ONLY)
Abs Immature Granulocytes: 0.02 10*3/uL (ref 0.00–0.07)
Basophils Absolute: 0 10*3/uL (ref 0.0–0.1)
Basophils Relative: 1 %
Eosinophils Absolute: 0.3 10*3/uL (ref 0.0–0.5)
Eosinophils Relative: 7 %
HCT: 35.2 % — ABNORMAL LOW (ref 39.0–52.0)
Hemoglobin: 11.3 g/dL — ABNORMAL LOW (ref 13.0–17.0)
Immature Granulocytes: 1 %
Lymphocytes Relative: 20 %
Lymphs Abs: 0.9 10*3/uL (ref 0.7–4.0)
MCH: 31.8 pg (ref 26.0–34.0)
MCHC: 32.1 g/dL (ref 30.0–36.0)
MCV: 99.2 fL (ref 80.0–100.0)
Monocytes Absolute: 0.6 10*3/uL (ref 0.1–1.0)
Monocytes Relative: 13 %
Neutro Abs: 2.5 10*3/uL (ref 1.7–7.7)
Neutrophils Relative %: 58 %
Platelet Count: 279 10*3/uL (ref 150–400)
RBC: 3.55 MIL/uL — ABNORMAL LOW (ref 4.22–5.81)
RDW: 13.2 % (ref 11.5–15.5)
WBC Count: 4.4 10*3/uL (ref 4.0–10.5)
nRBC: 0 % (ref 0.0–0.2)

## 2018-08-11 LAB — CMP (CANCER CENTER ONLY)
ALT: 10 U/L (ref 0–44)
AST: 13 U/L — ABNORMAL LOW (ref 15–41)
Albumin: 4 g/dL (ref 3.5–5.0)
Alkaline Phosphatase: 59 U/L (ref 38–126)
Anion gap: 8 (ref 5–15)
BUN: 18 mg/dL (ref 8–23)
CO2: 28 mmol/L (ref 22–32)
Calcium: 8.7 mg/dL — ABNORMAL LOW (ref 8.9–10.3)
Chloride: 102 mmol/L (ref 98–111)
Creatinine: 1.06 mg/dL (ref 0.61–1.24)
GFR, Est AFR Am: >60 mL/min (ref >60)
GFR, Estimated: >60 mL/min (ref >60)
Glucose, Bld: 75 mg/dL (ref 70–99)
Potassium: 3.9 mmol/L (ref 3.5–5.1)
Sodium: 138 mmol/L (ref 135–145)
Total Bilirubin: 0.3 mg/dL (ref 0.3–1.2)
Total Protein: 6.6 g/dL (ref 6.5–8.1)

## 2018-08-11 LAB — TSH: TSH: 4.195 u[IU]/mL — ABNORMAL HIGH (ref 0.320–4.118)

## 2018-08-11 LAB — LACTATE DEHYDROGENASE: LDH: 197 U/L — ABNORMAL HIGH (ref 98–192)

## 2018-08-11 MED ORDER — LEUPROLIDE ACETATE (3 MONTH) 22.5 MG IM KIT
22.5000 mg | PACK | Freq: Once | INTRAMUSCULAR | Status: AC
  Administered 2018-08-11: 22.5 mg via INTRAMUSCULAR
  Filled 2018-08-11: qty 22.5

## 2018-08-11 MED ORDER — SODIUM CHLORIDE 0.9% FLUSH
10.0000 mL | INTRAVENOUS | Status: DC | PRN
  Administered 2018-08-11: 10 mL
  Filled 2018-08-11: qty 10

## 2018-08-11 MED ORDER — DENOSUMAB 120 MG/1.7ML ~~LOC~~ SOLN
SUBCUTANEOUS | Status: AC
  Filled 2018-08-11: qty 1.7

## 2018-08-11 MED ORDER — DENOSUMAB 120 MG/1.7ML ~~LOC~~ SOLN
120.0000 mg | Freq: Once | SUBCUTANEOUS | Status: AC
  Administered 2018-08-11: 120 mg via SUBCUTANEOUS

## 2018-08-11 MED ORDER — SODIUM CHLORIDE 0.9 % IV SOLN
Freq: Once | INTRAVENOUS | Status: AC
  Administered 2018-08-11: 10:00:00 via INTRAVENOUS
  Filled 2018-08-11: qty 250

## 2018-08-11 MED ORDER — SODIUM CHLORIDE 0.9 % IV SOLN
400.0000 mg | Freq: Once | INTRAVENOUS | Status: AC
  Administered 2018-08-11: 400 mg via INTRAVENOUS
  Filled 2018-08-11: qty 16

## 2018-08-11 MED ORDER — HEPARIN SOD (PORK) LOCK FLUSH 100 UNIT/ML IV SOLN
500.0000 [IU] | Freq: Once | INTRAVENOUS | Status: AC | PRN
  Administered 2018-08-11: 500 [IU]
  Filled 2018-08-11: qty 5

## 2018-08-11 NOTE — Patient Instructions (Signed)
Implanted Port Insertion, Care After This sheet gives you information about how to care for yourself after your procedure. Your health care provider may also give you more specific instructions. If you have problems or questions, contact your health care provider. What can I expect after the procedure? After the procedure, it is common to have:  Discomfort at the port insertion site.  Bruising on the skin over the port. This should improve over 3-4 days. Follow these instructions at home: Port care  After your port is placed, you will get a manufacturer's information card. The card has information about your port. Keep this card with you at all times.  Take care of the port as told by your health care provider. Ask your health care provider if you or a family member can get training for taking care of the port at home. A home health care nurse may also take care of the port.  Make sure to remember what type of port you have. Incision care      Follow instructions from your health care provider about how to take care of your port insertion site. Make sure you: ? Wash your hands with soap and water before and after you change your bandage (dressing). If soap and water are not available, use hand sanitizer. ? Change your dressing as told by your health care provider. ? Leave stitches (sutures), skin glue, or adhesive strips in place. These skin closures may need to stay in place for 2 weeks or longer. If adhesive strip edges start to loosen and curl up, you may trim the loose edges. Do not remove adhesive strips completely unless your health care provider tells you to do that.  Check your port insertion site every day for signs of infection. Check for: ? Redness, swelling, or pain. ? Fluid or blood. ? Warmth. ? Pus or a bad smell. Activity  Return to your normal activities as told by your health care provider. Ask your health care provider what activities are safe for you.  Do not  lift anything that is heavier than 10 lb (4.5 kg), or the limit that you are told, until your health care provider says that it is safe. General instructions  Take over-the-counter and prescription medicines only as told by your health care provider.  Do not take baths, swim, or use a hot tub until your health care provider approves. Ask your health care provider if you may take showers. You may only be allowed to take sponge baths.  Do not drive for 24 hours if you were given a sedative during your procedure.  Wear a medical alert bracelet in case of an emergency. This will tell any health care providers that you have a port.  Keep all follow-up visits as told by your health care provider. This is important. Contact a health care provider if:  You cannot flush your port with saline as directed, or you cannot draw blood from the port.  You have a fever or chills.  You have redness, swelling, or pain around your port insertion site.  You have fluid or blood coming from your port insertion site.  Your port insertion site feels warm to the touch.  You have pus or a bad smell coming from the port insertion site. Get help right away if:  You have chest pain or shortness of breath.  You have bleeding from your port that you cannot control. Summary  Take care of the port as told by your health   care provider. Keep the manufacturer's information card with you at all times.  Change your dressing as told by your health care provider.  Contact a health care provider if you have a fever or chills or if you have redness, swelling, or pain around your port insertion site.  Keep all follow-up visits as told by your health care provider. This information is not intended to replace advice given to you by your health care provider. Make sure you discuss any questions you have with your health care provider. Document Released: 10/22/2012 Document Revised: 07/30/2017 Document Reviewed: 07/30/2017  Elsevier Patient Education  2020 [employer]  

## 2018-08-11 NOTE — Progress Notes (Signed)
Hematology and Oncology Follow Up Visit  [name] [id number] 04-03-1945 73 y.o. 08/11/2018   Principle Diagnosis:  Metastatic small cell carcinoma --unknown primary  Metastatic Prostate cancer -- castrate sensitive   Current Therapy:    Zytiga 1000 mg by mouth daily - discontinued on 08/15/2016  Xtandi 160 mg po q day - start on 08/15/2016 - discontinued  Xgeva 120 mg subcutaneous Q3 months - due in Oct 2020  Lupron 22 mg IM every 3 months - due in Oct 2020  Radium-223 therapy -- s/p cycle #6  Palliative radiation to the right sacrum  Lutathera - s/p cycle 4/4 --last dose on 07/11/2017  Carbo/VP-16/Tecentriq --  S/p cycle #4  Keytruda q 6 week -- Maintanence -- s/p cycle #3 - changed from 3 to 6 week on 06/30/2018     Interim History:  [name] is back for follow-up.  So far, everything is doing pretty well.  He the beach this past week.  He had a come back early as his mother-in-law fell and broke her left arm.  He is doing okay.  He has had no problems with pain.  His right leg is still weak but he is managing.  He has a brace on.  He has had no issues with bowels or bladder.  There is been no cough.  Back in April, his chromogranin A level was 200.  His last PSA was less than 0.1.  His last dotatate PET scan was back in April.  We really need to get another one set up for him.  Overall, his performance status is ECOG 1.     Medications:  Current Outpatient Medications:  .  acetaminophen (TYLENOL) 500 MG tablet, Take 1,000 mg by mouth every 4 (four) hours as needed for moderate pain., Disp: , Rfl:  .  aspirin 81 MG tablet, Take 81 mg by mouth daily., Disp: , Rfl:  .  CALCIUM-IRON-VIT D-VIT K PO, Take 1 tablet by mouth 2 (two) times daily., Disp: , Rfl:  .  docusate sodium (COLACE) 100 MG capsule, Take 200 mg by mouth 2 (two) times daily., Disp: , Rfl:  .  gabapentin (NEURONTIN) 300 MG capsule, TAKE 1 CAPSULE BY MOUTH TWICE A DAY THEN TAKE 2 CAPSULES AT  BEDTIME (Patient taking differently: Take 600 mg by mouth 2 (two) times daily. ), Disp: 120 capsule, Rfl: 4 .  isosorbide mononitrate (IMDUR) 30 MG 24 hr tablet, Take 1 tablet (30 mg total) by mouth daily., Disp: 90 tablet, Rfl: 1 .  nitroGLYCERIN (NITROSTAT) 0.4 MG SL tablet, Place 1 tablet (0.4 mg total) under the tongue every 5 (five) minutes as needed for chest pain., Disp: 25 tablet, Rfl: 11 .  OVER THE COUNTER MEDICATION, every morning. Juice Plus -- 8 capsule of each the garden, vineyard, and orchard twice a day., Disp: , Rfl:  .  solifenacin (VESICARE) 5 MG tablet, TAKE 1 TABLET BY MOUTH DAILY., Disp: 30 tablet, Rfl: 4 .  ZETIA 10 MG tablet, Take 10 mg by mouth daily. , Disp: , Rfl:  .  ondansetron (ZOFRAN) 8 MG tablet, Take 1 tablet (8 mg total) by mouth 2 (two) times daily as needed for nausea or vomiting. (Patient not taking: Reported on 08/11/2018), Disp: 20 tablet, Rfl: 0 No current facility-administered medications for this visit.   Facility-Administered Medications Ordered in Other Visits:  .  octreotide (SANDOSTATIN LAR) IM injection 30 mg, 30 mg, Intramuscular, Once, Ennever, Rudell Cobb, MD  Allergies:  Allergies  Allergen Reactions  . Codeine Nausea And Vomiting  . Hydrocodone Nausea And Vomiting    Past Medical History, Surgical history, Social history, and Family History were reviewed and updated.  Review of Systems: Review of Systems  Constitutional: Negative.   HENT: Negative.   Eyes: Negative.   Respiratory: Negative.   Cardiovascular: Negative.   Gastrointestinal: Negative.   Genitourinary: Negative.  Negative for urgency.  Musculoskeletal: Positive for joint pain.  Skin: Negative.   Neurological: Positive for focal weakness.  Endo/Heme/Allergies: Negative.   Psychiatric/Behavioral: Negative.   All other systems reviewed and are negative.    Physical Exam:  weight is 152 lb 1.6 oz (69 kg). His oral temperature is 97.9 F (36.6 C). His blood pressure is  115/79 and his pulse is 82. His respiration is 20 and oxygen saturation is 99%.   Physical Exam Vitals signs reviewed.  HENT:     Head: Normocephalic and atraumatic.  Eyes:     Pupils: Pupils are equal, round, and reactive to light.  Neck:     Musculoskeletal: Normal range of motion.  Cardiovascular:     Rate and Rhythm: Normal rate and regular rhythm.     Heart sounds: Normal heart sounds.  Pulmonary:     Effort: Pulmonary effort is normal.     Breath sounds: Normal breath sounds.  Abdominal:     General: Bowel sounds are normal.     Palpations: Abdomen is soft.  Musculoskeletal: Normal range of motion.        General: No tenderness or deformity.  Lymphadenopathy:     Cervical: No cervical adenopathy.  Skin:    General: Skin is warm and dry.     Findings: No erythema or rash.  Neurological:     Mental Status: He is alert and oriented to person, place, and time.  Psychiatric:        Behavior: Behavior normal.        Thought Content: Thought content normal.        Judgment: Judgment normal.      Lab Results  Component Value Date   WBC 4.4 08/11/2018   HGB 11.3 (L) 08/11/2018   HCT 35.2 (L) 08/11/2018   MCV 99.2 08/11/2018   PLT 279 08/11/2018     Chemistry      Component Value Date/Time   NA 139 06/30/2018 0900   NA 144 12/14/2016 1159   NA 140 10/22/2016 1129   K 4.1 06/30/2018 0900   K 3.5 12/14/2016 1159   K 4.3 10/22/2016 1129   CL 101 06/30/2018 0900   CL 103 12/14/2016 1159   CO2 29 06/30/2018 0900   CO2 30 12/14/2016 1159   CO2 27 10/22/2016 1129   BUN 24 (H) 06/30/2018 0900   BUN 16 12/14/2016 1159   BUN 14.0 10/22/2016 1129   CREATININE 1.17 06/30/2018 0900   CREATININE 1.3 (H) 12/14/2016 1159   CREATININE 1.1 10/22/2016 1129      Component Value Date/Time   CALCIUM 9.8 06/30/2018 0900   CALCIUM 10.0 12/14/2016 1159   CALCIUM 10.4 10/22/2016 1129   ALKPHOS 52 06/30/2018 0900   ALKPHOS 48 12/14/2016 1159   ALKPHOS 60 10/22/2016 1129   AST  14 (L) 06/30/2018 0900   AST 16 10/22/2016 1129   ALT 11 06/30/2018 0900   ALT 23 12/14/2016 1159   ALT 14 10/22/2016 1129   BILITOT 0.3 06/30/2018 0900   BILITOT 0.31 10/22/2016 1129         Impression  and Plan: [name] is 73 year old gentleman with 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  So far, he is tolerated the Keytruda quite nicely.  Again, we have to get his dotatate scan set up for him.  We will get this set up for late August.  He will get his Lupron today and the [place] today also.  We will see what his chromogranin A level is.      [name], MD 7/27/20209:15 AM

## 2018-08-12 LAB — PSA, TOTAL AND FREE
PSA, Free Pct: UNDETERMINED %
PSA, Free: <0.02 ng/mL
Prostate Specific Ag, Serum: <0.1 ng/mL (ref 0.0–4.0)

## 2018-08-13 DIAGNOSIS — Z9181 History of falling: Secondary | ICD-10-CM | POA: Diagnosis not present

## 2018-08-13 DIAGNOSIS — Z136 Encounter for screening for cardiovascular disorders: Secondary | ICD-10-CM | POA: Diagnosis not present

## 2018-08-13 DIAGNOSIS — Z1331 Encounter for screening for depression: Secondary | ICD-10-CM | POA: Diagnosis not present

## 2018-08-13 DIAGNOSIS — E785 Hyperlipidemia, unspecified: Secondary | ICD-10-CM | POA: Diagnosis not present

## 2018-08-13 DIAGNOSIS — Z0000 Encounter for general adult medical examination without abnormal findings: Secondary | ICD-10-CM | POA: Diagnosis not present

## 2018-08-13 DIAGNOSIS — Z125 Encounter for screening for malignant neoplasm of prostate: Secondary | ICD-10-CM | POA: Diagnosis not present

## 2018-08-16 LAB — THYROGLOBULIN LEVEL: Thyroglobulin: 7.5 ng/mL

## 2018-08-19 ENCOUNTER — Other Ambulatory Visit: Payer: Medicare Other

## 2018-08-19 ENCOUNTER — Ambulatory Visit: Payer: Medicare Other | Admitting: Hematology & Oncology

## 2018-08-19 ENCOUNTER — Ambulatory Visit: Payer: Medicare Other

## 2018-08-22 ENCOUNTER — Encounter: Payer: Self-pay | Admitting: Hematology & Oncology

## 2018-08-22 ENCOUNTER — Inpatient Hospital Stay: Payer: Medicare Other

## 2018-08-22 ENCOUNTER — Inpatient Hospital Stay (HOSPITAL_BASED_OUTPATIENT_CLINIC_OR_DEPARTMENT_OTHER): Payer: Medicare Other | Admitting: Hematology & Oncology

## 2018-08-22 ENCOUNTER — Inpatient Hospital Stay: Payer: Medicare Other | Attending: Hematology & Oncology

## 2018-08-22 ENCOUNTER — Other Ambulatory Visit: Payer: Self-pay

## 2018-08-22 VITALS — BP 110/70 | HR 82 | Temp 97.9°F | Resp 18 | Wt 152.0 lb

## 2018-08-22 DIAGNOSIS — C7B8 Other secondary neuroendocrine tumors: Secondary | ICD-10-CM | POA: Diagnosis not present

## 2018-08-22 DIAGNOSIS — Z885 Allergy status to narcotic agent status: Secondary | ICD-10-CM | POA: Diagnosis not present

## 2018-08-22 DIAGNOSIS — Z79899 Other long term (current) drug therapy: Secondary | ICD-10-CM | POA: Insufficient documentation

## 2018-08-22 DIAGNOSIS — R531 Weakness: Secondary | ICD-10-CM | POA: Diagnosis not present

## 2018-08-22 DIAGNOSIS — R918 Other nonspecific abnormal finding of lung field: Secondary | ICD-10-CM | POA: Diagnosis not present

## 2018-08-22 DIAGNOSIS — C61 Malignant neoplasm of prostate: Secondary | ICD-10-CM | POA: Insufficient documentation

## 2018-08-22 DIAGNOSIS — M2550 Pain in unspecified joint: Secondary | ICD-10-CM | POA: Insufficient documentation

## 2018-08-22 DIAGNOSIS — C7A8 Other malignant neuroendocrine tumors: Secondary | ICD-10-CM

## 2018-08-22 LAB — CMP (CANCER CENTER ONLY)
ALT: 11 U/L (ref 0–44)
AST: 16 U/L (ref 15–41)
Albumin: 4 g/dL (ref 3.5–5.0)
Alkaline Phosphatase: 58 U/L (ref 38–126)
Anion gap: 8 (ref 5–15)
BUN: 20 mg/dL (ref 8–23)
CO2: 31 mmol/L (ref 22–32)
Calcium: 9.4 mg/dL (ref 8.9–10.3)
Chloride: 99 mmol/L (ref 98–111)
Creatinine: 1.14 mg/dL (ref 0.61–1.24)
GFR, Est AFR Am: >60 mL/min (ref >60)
GFR, Estimated: >60 mL/min (ref >60)
Glucose, Bld: 93 mg/dL (ref 70–99)
Potassium: 4.1 mmol/L (ref 3.5–5.1)
Sodium: 138 mmol/L (ref 135–145)
Total Bilirubin: 0.3 mg/dL (ref 0.3–1.2)
Total Protein: 6.5 g/dL (ref 6.5–8.1)

## 2018-08-22 LAB — CBC WITH DIFFERENTIAL (CANCER CENTER ONLY)
Abs Immature Granulocytes: 0.02 10*3/uL (ref 0.00–0.07)
Basophils Absolute: 0.1 10*3/uL (ref 0.0–0.1)
Basophils Relative: 1 %
Eosinophils Absolute: 0.3 10*3/uL (ref 0.0–0.5)
Eosinophils Relative: 4 %
HCT: 36.6 % — ABNORMAL LOW (ref 39.0–52.0)
Hemoglobin: 11.7 g/dL — ABNORMAL LOW (ref 13.0–17.0)
Immature Granulocytes: 0 %
Lymphocytes Relative: 14 %
Lymphs Abs: 1.1 10*3/uL (ref 0.7–4.0)
MCH: 31.3 pg (ref 26.0–34.0)
MCHC: 32 g/dL (ref 30.0–36.0)
MCV: 97.9 fL (ref 80.0–100.0)
Monocytes Absolute: 0.8 10*3/uL (ref 0.1–1.0)
Monocytes Relative: 11 %
Neutro Abs: 5.4 10*3/uL (ref 1.7–7.7)
Neutrophils Relative %: 70 %
Platelet Count: 268 10*3/uL (ref 150–400)
RBC: 3.74 MIL/uL — ABNORMAL LOW (ref 4.22–5.81)
RDW: 13.2 % (ref 11.5–15.5)
WBC Count: 7.7 10*3/uL (ref 4.0–10.5)
nRBC: 0 % (ref 0.0–0.2)

## 2018-08-22 NOTE — Progress Notes (Signed)
Hematology and Oncology Follow Up Visit  MAEJOR ERVEN [id number] 1945-07-05 73 y.o. 08/22/2018   Principle Diagnosis:  Metastatic small cell carcinoma --unknown primary  Metastatic Prostate cancer -- castrate sensitive   Current Therapy:    Zytiga 1000 mg by mouth daily - discontinued on 08/15/2016  Xtandi 160 mg po q day - start on 08/15/2016 - discontinued  Xgeva 120 mg subcutaneous Q3 months - due in Oct 2020  Lupron 22 mg IM every 3 months - due in Oct 2020  Radium-223 therapy -- s/p cycle #6  Palliative radiation to the right sacrum  Lutathera - s/p cycle 4/4 --last dose on 07/11/2017  Carbo/VP-16/Tecentriq --  S/p cycle #4  Keytruda q 6 week -- Maintanence -- s/p cycle #3 - changed from 3 to 6 week on 06/30/2018     Interim History:  Mr.  Weisensel is back for follow-up.  So far, everything is doing pretty well.  He the beach this past week.  He had a come back early as his mother-in-law fell and broke her left arm.  He is doing okay.  He has had no problems with pain.  His right leg is still weak but he is managing.  He has a brace on.  He has had no issues with bowels or bladder.  There is been no cough.  Back in April, his chromogranin A level was 200.  His last PSA was less than 0.1.  His last dotatate PET scan was back in April.  We really need to get another one set up for him.  Overall, his performance status is ECOG 1.     Medications:  Current Outpatient Medications:  .  acetaminophen (TYLENOL) 500 MG tablet, Take 1,000 mg by mouth every 4 (four) hours as needed for moderate pain., Disp: , Rfl:  .  aspirin 81 MG tablet, Take 81 mg by mouth daily., Disp: , Rfl:  .  CALCIUM-IRON-VIT D-VIT K PO, Take 1 tablet by mouth 2 (two) times daily., Disp: , Rfl:  .  docusate sodium (COLACE) 100 MG capsule, Take 200 mg by mouth 2 (two) times daily., Disp: , Rfl:  .  gabapentin (NEURONTIN) 300 MG capsule, TAKE 1 CAPSULE BY MOUTH TWICE A DAY THEN TAKE 2 CAPSULES AT  BEDTIME (Patient taking differently: Take 600 mg by mouth 2 (two) times daily. ), Disp: 120 capsule, Rfl: 4 .  isosorbide mononitrate (IMDUR) 30 MG 24 hr tablet, Take 1 tablet (30 mg total) by mouth daily., Disp: 90 tablet, Rfl: 1 .  nitroGLYCERIN (NITROSTAT) 0.4 MG SL tablet, Place 1 tablet (0.4 mg total) under the tongue every 5 (five) minutes as needed for chest pain., Disp: 25 tablet, Rfl: 11 .  ondansetron (ZOFRAN) 8 MG tablet, Take 1 tablet (8 mg total) by mouth 2 (two) times daily as needed for nausea or vomiting. (Patient not taking: Reported on 08/11/2018), Disp: 20 tablet, Rfl: 0 .  OVER THE COUNTER MEDICATION, every morning. Juice Plus -- 8 capsule of each the garden, vineyard, and orchard twice a day., Disp: , Rfl:  .  solifenacin (VESICARE) 5 MG tablet, TAKE 1 TABLET BY MOUTH DAILY., Disp: 30 tablet, Rfl: 4 .  ZETIA 10 MG tablet, Take 10 mg by mouth daily. , Disp: , Rfl:  No current facility-administered medications for this visit.   Facility-Administered Medications Ordered in Other Visits:  .  octreotide (SANDOSTATIN LAR) IM injection 30 mg, 30 mg, Intramuscular, Once, , Rudell Cobb, MD  Allergies:  Allergies  Allergen Reactions  . Codeine Nausea And Vomiting  . Hydrocodone Nausea And Vomiting    Past Medical History, Surgical history, Social history, and Family History were reviewed and updated.  Review of Systems: Review of Systems  Constitutional: Negative.   HENT: Negative.   Eyes: Negative.   Respiratory: Negative.   Cardiovascular: Negative.   Gastrointestinal: Negative.   Genitourinary: Negative.  Negative for urgency.  Musculoskeletal: Positive for joint pain.  Skin: Negative.   Neurological: Positive for focal weakness.  Endo/Heme/Allergies: Negative.   Psychiatric/Behavioral: Negative.   All other systems reviewed and are negative.    Physical Exam:  weight is 152 lb (68.9 kg). His temperature is 97.9 F (36.6 C). His blood pressure is 110/70 and  his pulse is 82. His respiration is 18 and oxygen saturation is 100%.   Physical Exam Vitals signs reviewed.  HENT:     Head: Normocephalic and atraumatic.  Eyes:     Pupils: Pupils are equal, round, and reactive to light.  Neck:     Musculoskeletal: Normal range of motion.  Cardiovascular:     Rate and Rhythm: Normal rate and regular rhythm.     Heart sounds: Normal heart sounds.  Pulmonary:     Effort: Pulmonary effort is normal.     Breath sounds: Normal breath sounds.  Abdominal:     General: Bowel sounds are normal.     Palpations: Abdomen is soft.  Musculoskeletal: Normal range of motion.        General: No tenderness or deformity.  Lymphadenopathy:     Cervical: No cervical adenopathy.  Skin:    General: Skin is warm and dry.     Findings: No erythema or rash.  Neurological:     Mental Status: He is alert and oriented to person, place, and time.  Psychiatric:        Behavior: Behavior normal.        Thought Content: Thought content normal.        Judgment: Judgment normal.      Lab Results  Component Value Date   WBC 7.7 08/22/2018   HGB 11.7 (L) 08/22/2018   HCT 36.6 (L) 08/22/2018   MCV 97.9 08/22/2018   PLT 268 08/22/2018     Chemistry      Component Value Date/Time   NA 138 08/22/2018 1235   NA 144 12/14/2016 1159   NA 140 10/22/2016 1129   K 4.1 08/22/2018 1235   K 3.5 12/14/2016 1159   K 4.3 10/22/2016 1129   CL 99 08/22/2018 1235   CL 103 12/14/2016 1159   CO2 31 08/22/2018 1235   CO2 30 12/14/2016 1159   CO2 27 10/22/2016 1129   BUN 20 08/22/2018 1235   BUN 16 12/14/2016 1159   BUN 14.0 10/22/2016 1129   CREATININE 1.14 08/22/2018 1235   CREATININE 1.3 (H) 12/14/2016 1159   CREATININE 1.1 10/22/2016 1129      Component Value Date/Time   CALCIUM 9.4 08/22/2018 1235   CALCIUM 10.0 12/14/2016 1159   CALCIUM 10.4 10/22/2016 1129   ALKPHOS 58 08/22/2018 1235   ALKPHOS 48 12/14/2016 1159   ALKPHOS 60 10/22/2016 1129   AST 16 08/22/2018  1235   AST 16 10/22/2016 1129   ALT 11 08/22/2018 1235   ALT 23 12/14/2016 1159   ALT 14 10/22/2016 1129   BILITOT 0.3 08/22/2018 1235   BILITOT 0.31 10/22/2016 1129         Impression and Plan: Mr. 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  So far, he is tolerated the Keytruda quite nicely.  Again, we have to get his dotatate scan set up for him.  We will get this set up for late August.  I will plan to see him back in early September.  We will have to watch his TSH.  It is up just a little bit.  If it continues to rise, then we probably will need to get him on some Synthroid.      [name], MD 8/7/20201:44 PM

## 2018-08-22 NOTE — Patient Instructions (Signed)

## 2018-08-23 LAB — TESTOSTERONE: Testosterone: <3 ng/dL — ABNORMAL LOW (ref 264–916)

## 2018-08-23 LAB — PROSTATE-SPECIFIC AG, SERUM (LABCORP): Prostate Specific Ag, Serum: <0.1 ng/mL (ref 0.0–4.0)

## 2018-08-25 LAB — LACTATE DEHYDROGENASE: LDH: 172 U/L (ref 98–192)

## 2018-08-25 LAB — CHROMOGRANIN A: Chromogranin A (ng/mL): 111.6 ng/mL — ABNORMAL HIGH (ref 0.0–101.8)

## 2018-08-27 MED FILL — ISOSORBIDE MN ER 30 MG TAB: 30 | 90 days supply | Qty: 90 | Fill #1

## 2018-08-27 MED FILL — SOLIFENACIN SUCCINATE 5 MG: 5 | 30 days supply | Qty: 30 | Fill #2

## 2018-08-27 MED FILL — GABAPENTIN 300 MG CAPSULE: 300 | 30 days supply | Qty: 120 | Fill #0

## 2018-08-27 MED FILL — EZETIMIBE 10 MG TABS: 10 | 90 days supply | Qty: 90 | Fill #0

## 2018-08-28 ENCOUNTER — Other Ambulatory Visit: Payer: Self-pay

## 2018-08-28 ENCOUNTER — Ambulatory Visit (HOSPITAL_COMMUNITY)
Admission: RE | Admit: 2018-08-28 | Discharge: 2018-08-28 | Disposition: A | Discharge status: Home / Self Care | Payer: Medicare Other | Source: Ambulatory Visit | Attending: Hematology & Oncology | Admitting: Hematology & Oncology

## 2018-08-28 DIAGNOSIS — C7A8 Other malignant neuroendocrine tumors: Secondary | ICD-10-CM | POA: Insufficient documentation

## 2018-08-28 DIAGNOSIS — C801 Malignant (primary) neoplasm, unspecified: Secondary | ICD-10-CM | POA: Diagnosis not present

## 2018-08-28 DIAGNOSIS — C7802 Secondary malignant neoplasm of left lung: Secondary | ICD-10-CM | POA: Diagnosis not present

## 2018-08-28 DIAGNOSIS — C7801 Secondary malignant neoplasm of right lung: Secondary | ICD-10-CM | POA: Diagnosis not present

## 2018-08-28 MED ORDER — GALLIUM GA 68 DOTATATE IV KIT
3.9200 | PACK | Freq: Once | INTRAVENOUS | Status: AC
  Administered 2018-08-28: 3.92 via INTRAVENOUS

## 2018-09-01 ENCOUNTER — Ambulatory Visit: Payer: Medicare Other | Admitting: Hematology & Oncology

## 2018-09-01 ENCOUNTER — Other Ambulatory Visit: Payer: Medicare Other

## 2018-09-01 ENCOUNTER — Ambulatory Visit: Payer: Medicare Other

## 2018-09-01 IMAGING — CT CT ABD-PELV W/ CM
2 of 5 series · 16 of 46 positions shown, 18 images · IV contrast (APPLIED)
Comparison: PET scan on 11/06/2016

CLINICAL DATA: Abdominal pain, nausea and diarrhea. History of
metastatic prostate carcinoma.

EXAM:
CT ABDOMEN AND PELVIS WITH CONTRAST
TECHNIQUE: Multidetector CT imaging of the abdomen and pelvis was performed
using the standard protocol following bolus administration of
intravenous contrast.
CONTRAST:  100mL [id number] IOPAMIDOL ([id number]) INJECTION 61%

[Series 2: axial st · axial · 0.77mm/px · z∈[-482,-42]mm · 13 of 100 slices shown, 15 images]
[im 6/100  soft-tissue]
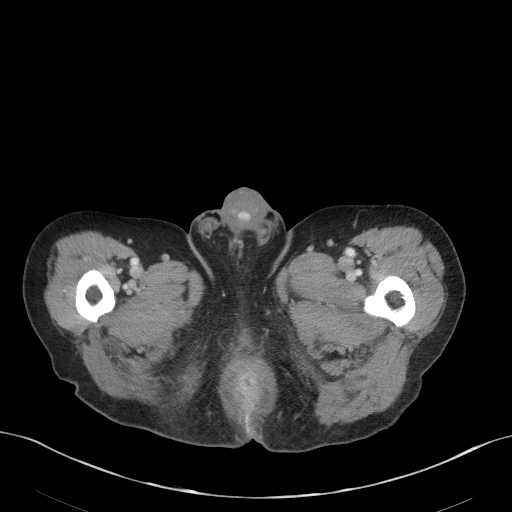
[im 6/100  bone]
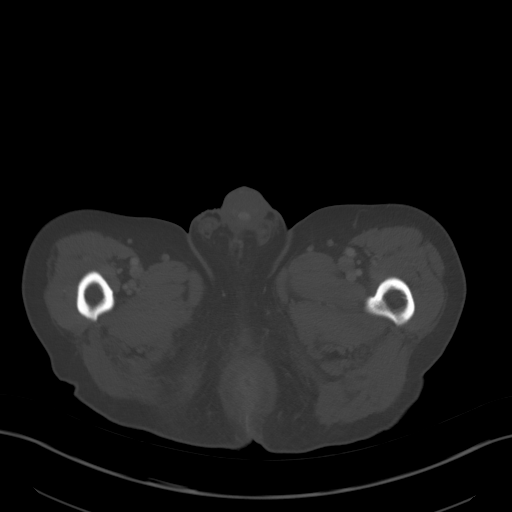
[im 16/100  soft-tissue]
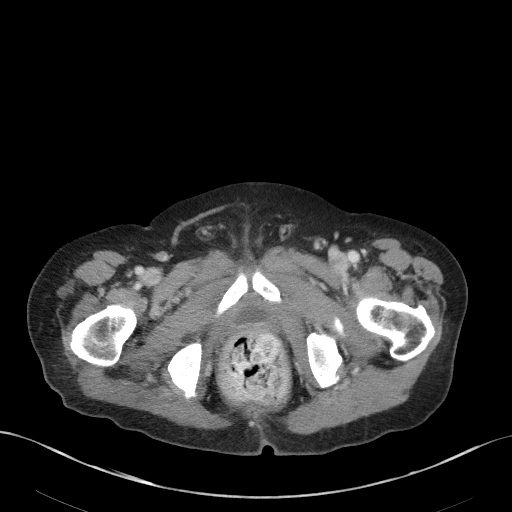
[im 21/100  soft-tissue]
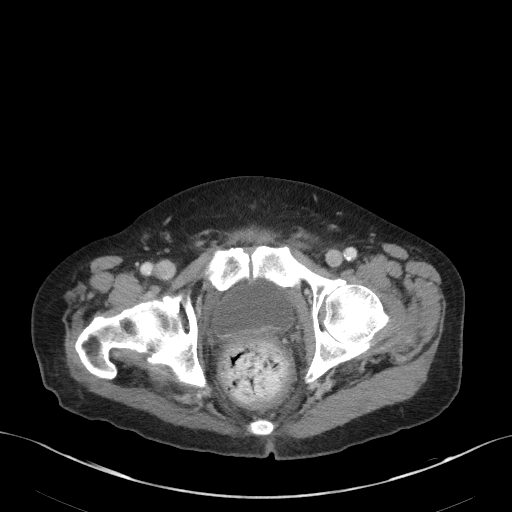
[im 27/100  soft-tissue]
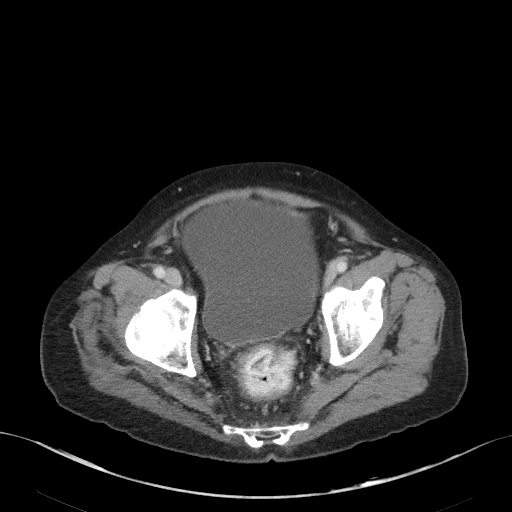
[im 37/100  soft-tissue]
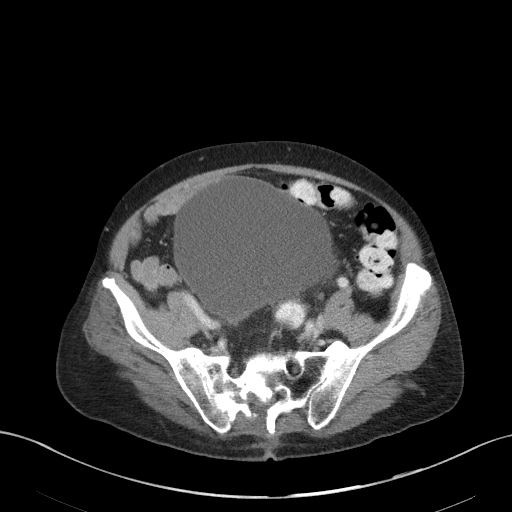
[im 42/100  soft-tissue]
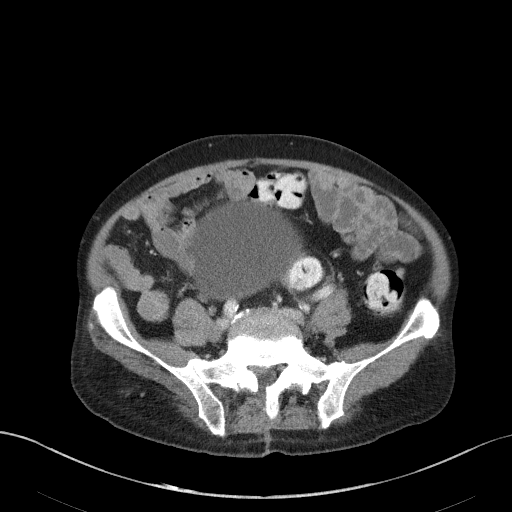
[im 53/100  soft-tissue]
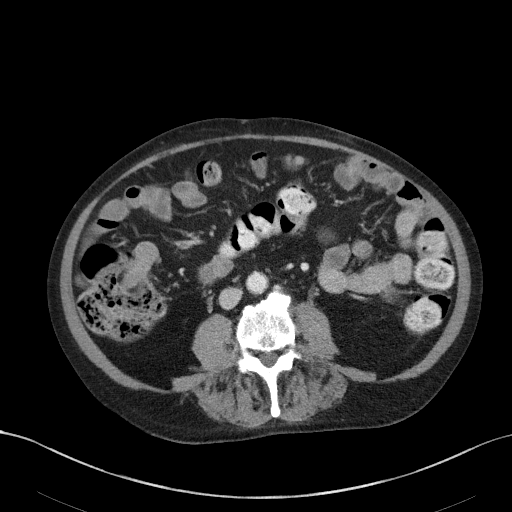
[im 58/100  soft-tissue]
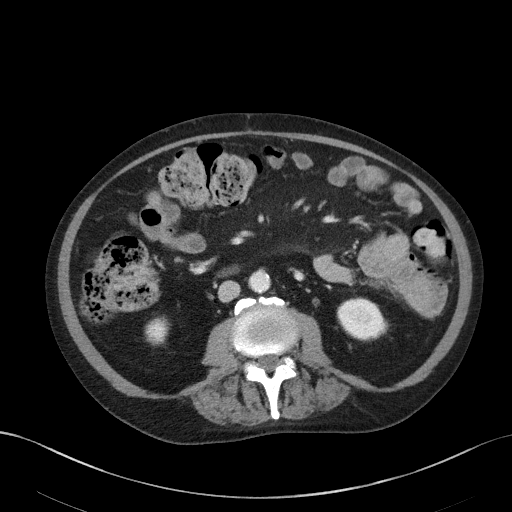
[im 63/100  soft-tissue]
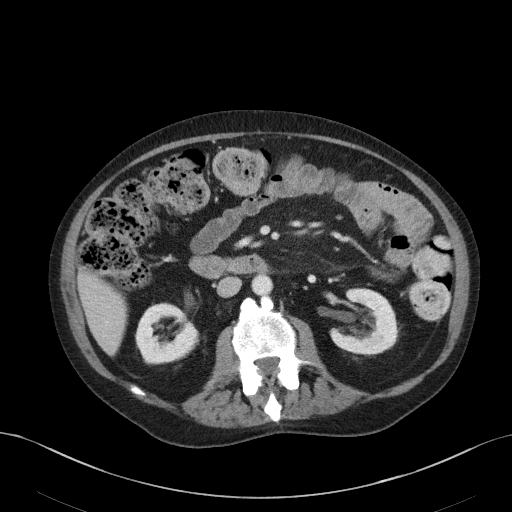
[im 63/100  bone]
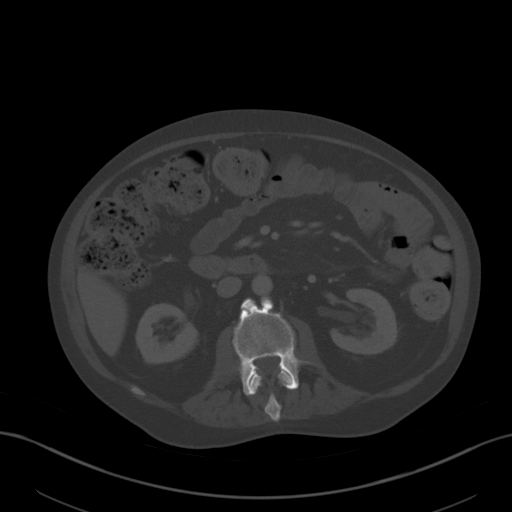
[im 73/100  soft-tissue]
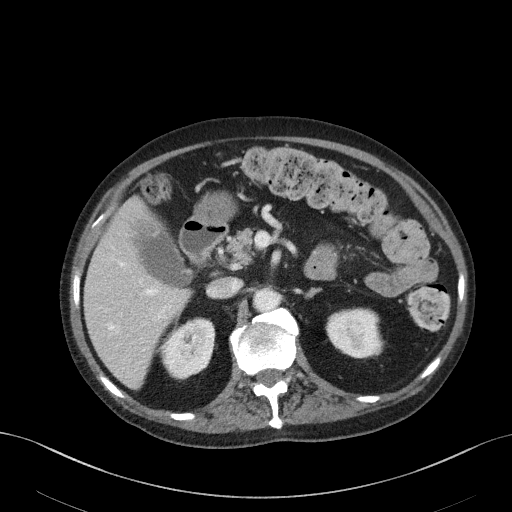
[im 79/100  soft-tissue]
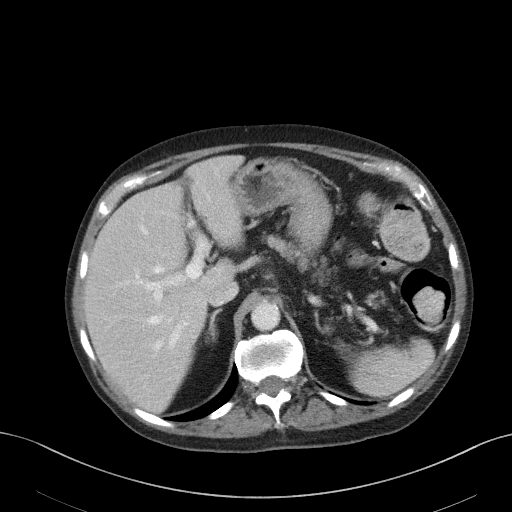
[im 84/100  soft-tissue]
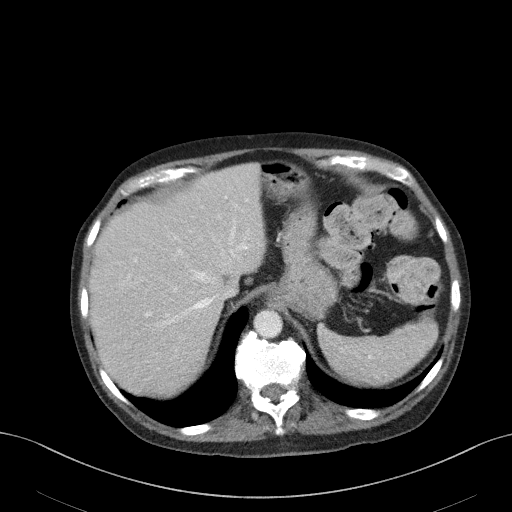
[im 94/100  soft-tissue]
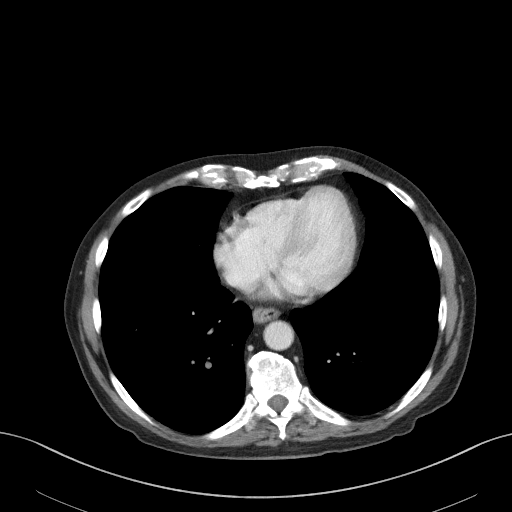

[Series 5: coronal st · coronal · 0.82mm/px · 3 of 89 slices shown]
[im 30/89  soft-tissue]
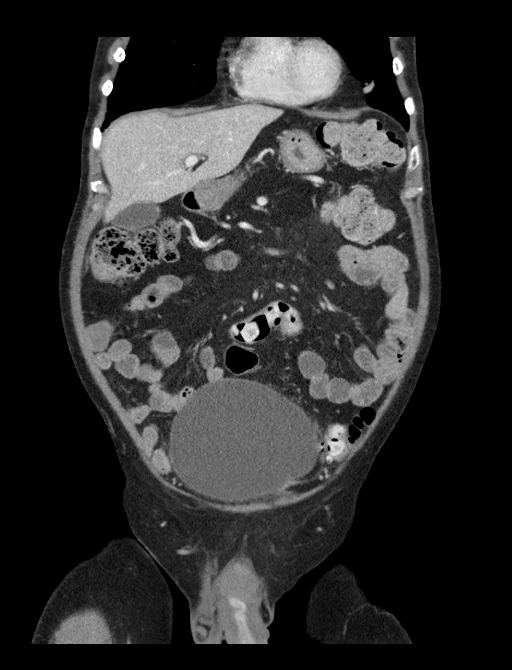
[im 40/89  soft-tissue]
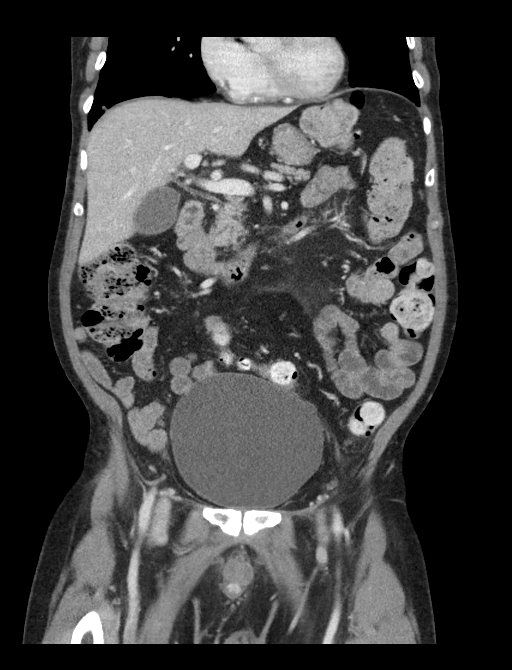
[im 49/89  soft-tissue]
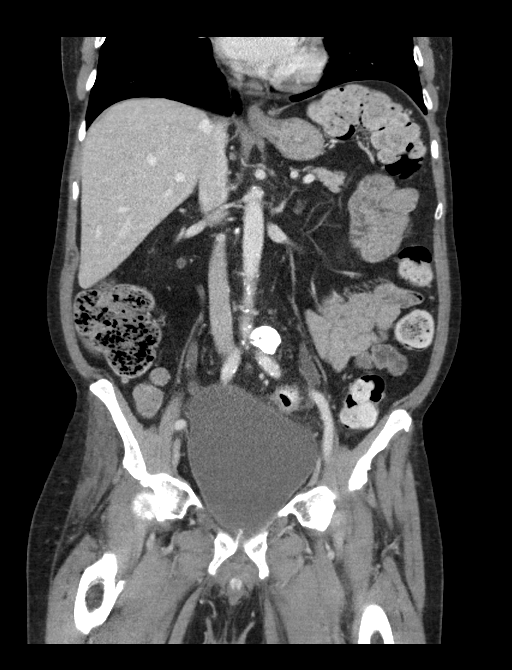

[16 of 46 positions shown; findings below may reference images not displayed]

FINDINGS: Lower chest: Multiple metastatic pulmonary nodules again noted at
both lung bases. No pleural effusions.

Hepatobiliary: Stable cyst in left lobe of liver. No gallstones,
gallbladder wall thickening, or biliary dilatation.

Pancreas: Unremarkable. No pancreatic ductal dilatation or
surrounding inflammatory changes.

Spleen: Normal in size without focal abnormality.

Adrenals/Urinary Tract: Adrenal glands and kidneys appear
unremarkable. The bladder is very distended with urine and there may
be a component of relative outlet obstruction.

Stomach/Bowel: No evidence of bowel obstruction. There are multiple
non dilated and fluid filled loops of small bowel. This is
nonspecific but may be indicative of enteritis. There maybe some
relative thickening of the distal rectum. No free air.

Vascular/Lymphatic: No significant vascular findings are present. No
enlarged abdominal or pelvic lymph nodes.

Reproductive: Status post prostatectomy.

Other: No abdominal wall hernia or abnormality. No abdominopelvic
ascites.

Musculoskeletal: Sclerotic metastatic disease to the right sacrum
appears stable.
IMPRESSION: 1. Stable metastatic pulmonary nodules at the lung bases.
2. Distended urinary bladder.
3. Nonspecific fluid filled small bowel loops which may be
indicative of enteritis.
4. Possible relative thickening of the distal rectum which could be
indicative of proctitis.

## 2018-09-02 ENCOUNTER — Telehealth: Payer: Self-pay | Admitting: Hematology & Oncology

## 2018-09-02 NOTE — Telephone Encounter (Signed)
Spoke with patient to confirm 8/19 appt at 3 pm per 8/17 sch msh

## 2018-09-03 ENCOUNTER — Inpatient Hospital Stay (HOSPITAL_BASED_OUTPATIENT_CLINIC_OR_DEPARTMENT_OTHER): Payer: Medicare Other | Admitting: Hematology & Oncology

## 2018-09-03 ENCOUNTER — Other Ambulatory Visit: Payer: Self-pay

## 2018-09-03 ENCOUNTER — Encounter: Payer: Self-pay | Admitting: Hematology & Oncology

## 2018-09-03 VITALS — BP 134/76 | HR 90 | Temp 96.9°F | Resp 18 | Wt 152.0 lb

## 2018-09-03 DIAGNOSIS — R918 Other nonspecific abnormal finding of lung field: Secondary | ICD-10-CM | POA: Diagnosis not present

## 2018-09-03 DIAGNOSIS — Z79899 Other long term (current) drug therapy: Secondary | ICD-10-CM | POA: Diagnosis not present

## 2018-09-03 DIAGNOSIS — R531 Weakness: Secondary | ICD-10-CM | POA: Diagnosis not present

## 2018-09-03 DIAGNOSIS — C61 Malignant neoplasm of prostate: Secondary | ICD-10-CM | POA: Diagnosis not present

## 2018-09-03 DIAGNOSIS — C7A8 Other malignant neuroendocrine tumors: Secondary | ICD-10-CM

## 2018-09-03 DIAGNOSIS — M2550 Pain in unspecified joint: Secondary | ICD-10-CM | POA: Diagnosis not present

## 2018-09-03 DIAGNOSIS — C7B8 Other secondary neuroendocrine tumors: Secondary | ICD-10-CM | POA: Diagnosis not present

## 2018-09-03 MED ORDER — DEXAMETHASONE 4 MG PO TABS: 8.0000 mg | ORAL_TABLET | Freq: Two times a day (BID) | ORAL | 3 refills | Status: AC

## 2018-09-03 MED FILL — DEXAMETHASONE 4 MG TABLET: 4 | 15 days supply | Qty: 60 | Fill #0

## 2018-09-03 NOTE — Progress Notes (Signed)
START OFF PATHWAY REGIMEN - Small Cell Lung   [id number]:Pembrolizumab 200 mg q21 Days:   A cycle is 21 days:     Pembrolizumab   **Always confirm dose/schedule in your pharmacy ordering system**  Patient Characteristics: Relapsed or Progressive Disease, Second Line, Relapse < 3 Months Therapeutic Status: Relapsed or Progressive Disease Line of Therapy: Second Line Time to Relapse: Relapse < 3 Months Intent of Therapy: Non-Curative / Palliative Intent, Discussed with Patient

## 2018-09-03 NOTE — Progress Notes (Signed)
DISCONTINUE ON PATHWAY REGIMEN - Small Cell Lung     Cycles 1 through 4, every 21 days:     Atezolizumab      Carboplatin      Etoposide    Cycles 5 and beyond, every 21 days:     Atezolizumab   **Always confirm dose/schedule in your pharmacy ordering system**  REASON: Disease Progression PRIOR TREATMENT: [id number]: Atezolizumab 1,200 mg D1 + Carboplatin AUC=5 D1 + Etoposide 100 mg/m2 D1-3 q21 Days x 4 Cycles, Followed by Atezolizumab 1,200 mg Maintenance Until Progression or Unacceptable Toxicity TREATMENT RESPONSE: Partial Response (PR)  START OFF PATHWAY REGIMEN - Small Cell Lung   [id number]:Docetaxel (100 mg/m2):   A cycle is every 21 days:     Docetaxel   **Always confirm dose/schedule in your pharmacy ordering system**  Patient Characteristics: Relapsed or Progressive Disease, Second Line, Relapse < 3 Months Therapeutic Status: Relapsed or Progressive Disease Line of Therapy: Second Line Time to Relapse: Relapse < 3 Months Intent of Therapy: Non-Curative / Palliative Intent, Discussed with Patient

## 2018-09-03 NOTE — Progress Notes (Signed)
Hematology and Oncology Follow Up Visit  [name] [id number] [birth year] 73 y.o. 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 is back for follow-up.  Unfortunately, looks like we do have a problem now.  We did do his PET scan.  This was the dotatate PET scan.  This was done on 08/28/2018.  Looks like he does have recurrence.  He has increased size of pulmonary nodules.  There also appears to be a new area in the central sacrum that has activity.  I am very disturbed by this.  He had a really nice response to carboplatinum/VP-16.  Unfortunately is only been 4 months since he had his last chemotherapy.  I would think that he would never be symptomatic with these new areas.  The tumors are quite small.  I think that we have to get him back onto chemotherapy.  I just feel bad about that.  I thought he was really doing well with the [facility] as a maintenance.  I think that Taxotere would be a good idea.  I would try to combine this with the [facility] and we will see if this cannot get him back into a remission.  His wife is listing on the cell phone.  I gave him information about the Taxotere.  I do worry about the fact that he might be immunosuppressed because of his blood counts being affected.   I told him he probably cannot work while taking the Taxotere.  He has had no problems with cough.  There is no chest wall pain.  He has had no nausea or vomiting.  His last chromogranin A level was 112.  Overall, I think his performance status is still quite good at ECOG 1.      Medications:  Current Outpatient Medications:    acetaminophen (TYLENOL) 500 MG tablet, Take 1,000 mg by mouth every 4 (four) hours as needed for moderate pain., Disp: , Rfl:    aspirin 81 MG tablet, Take 81 mg by mouth daily., Disp: , Rfl:    CALCIUM-IRON-VIT D-VIT K PO, Take 1 tablet by mouth 2 (two) times daily., Disp: , Rfl:    docusate sodium (COLACE) 100 MG capsule, Take 200 mg by mouth 2 (two) times daily., Disp: , Rfl:    gabapentin (NEURONTIN) 300 MG capsule, TAKE 1 CAPSULE BY MOUTH TWICE A DAY THEN TAKE 2 CAPSULES AT BEDTIME (Patient taking differently: Take 600 mg by mouth 2 (two) times daily. ), Disp: 120 capsule, Rfl: 4   OVER THE COUNTER MEDICATION, every morning. Juice Plus -- 8 capsule of each the garden, vineyard, and orchard twice a day., Disp: , Rfl:    solifenacin (VESICARE) 5 MG tablet, TAKE 1 TABLET BY MOUTH  DAILY., Disp: 30 tablet, Rfl: 4   ZETIA 10 MG tablet, Take 10 mg by mouth daily. , Disp: , Rfl:    isosorbide mononitrate (IMDUR) 30 MG 24 hr tablet, Take 1 tablet (30 mg total) by mouth daily., Disp: 90 tablet, Rfl: 1   nitroGLYCERIN (NITROSTAT) 0.4 MG SL tablet, Place 1 tablet (0.4 mg total) under the tongue every 5 (five) minutes as needed for chest pain., Disp: 25 tablet, Rfl: 11   ondansetron (ZOFRAN) 8 MG tablet, Take 1 tablet (8 mg total) by mouth 2 (two) times daily as needed for nausea or vomiting. (Patient not taking: Reported on 08/11/2018), Disp: 20 tablet, Rfl: 0 No current facility-administered medications for this visit.   Facility-Administered Medications Ordered in Other Visits:    octreotide (SANDOSTATIN LAR) IM injection 30 mg, 30 mg, Intramuscular, Once,  Dezyre Hoefer, Rudell Cobb, MD  Allergies:  Allergies  Allergen Reactions   Codeine Nausea And Vomiting   Hydrocodone Nausea And Vomiting    Past Medical History, Surgical history, Social history, and Family History were reviewed and updated.  Review of Systems: Review of Systems  Constitutional: Negative.   HENT: Negative.   Eyes: Negative.   Respiratory: Negative.   Cardiovascular: Negative.   Gastrointestinal: Negative.   Genitourinary: Negative.  Negative for urgency.  Musculoskeletal: Positive for joint pain.  Skin: Negative.   Neurological: Positive for focal weakness.  Endo/Heme/Allergies: Negative.   Psychiatric/Behavioral: Negative.   All other systems reviewed and are negative.    Physical Exam:  weight is 152 lb (68.9 kg). His temporal temperature is 96.9 F (36.1 C) (abnormal). His blood pressure is 134/76 and his pulse is 90. His respiration is 18 and oxygen saturation is 100%.   Physical Exam Vitals signs reviewed.  HENT:     Head: Normocephalic and atraumatic.  Eyes:     Pupils: Pupils are equal, round, and reactive to light.  Neck:     Musculoskeletal: Normal range of motion.  Cardiovascular:     Rate and Rhythm: Normal rate and regular rhythm.     Heart sounds: Normal heart sounds.  Pulmonary:     Effort: Pulmonary effort is normal.     Breath sounds: Normal breath sounds.  Abdominal:     General: Bowel sounds are normal.     Palpations: Abdomen is soft.  Musculoskeletal: Normal range of motion.        General: No tenderness or deformity.  Lymphadenopathy:     Cervical: No cervical adenopathy.  Skin:    General: Skin is warm and dry.     Findings: No erythema or rash.  Neurological:     Mental Status: He is alert and oriented to person, place, and time.  Psychiatric:        Behavior: Behavior normal.        Thought Content: Thought content normal.        Judgment: Judgment normal.      Lab Results  Component Value Date   WBC 7.7 08/22/2018    HGB 11.7 (L) 08/22/2018   HCT 36.6 (L) 08/22/2018   MCV 97.9 08/22/2018   PLT 268 08/22/2018     Chemistry      Component Value Date/Time   NA 138 08/22/2018 1235   NA 144 12/14/2016 1159   NA 140 10/22/2016 1129   K 4.1 08/22/2018 1235   K 3.5 12/14/2016 1159   K 4.3 10/22/2016 1129   CL 99 08/22/2018 1235   CL 103 12/14/2016 1159  CO2 31 08/22/2018 1235   CO2 30 12/14/2016 1159   CO2 27 10/22/2016 1129   BUN 20 08/22/2018 1235   BUN 16 12/14/2016 1159   BUN 14.0 10/22/2016 1129   CREATININE 1.14 08/22/2018 1235   CREATININE 1.3 (H) 12/14/2016 1159   CREATININE 1.1 10/22/2016 1129      Component Value Date/Time   CALCIUM 9.4 08/22/2018 1235   CALCIUM 10.0 12/14/2016 1159   CALCIUM 10.4 10/22/2016 1129   ALKPHOS 58 08/22/2018 1235   ALKPHOS 48 12/14/2016 1159   ALKPHOS 60 10/22/2016 1129   AST 16 08/22/2018 1235   AST 16 10/22/2016 1129   ALT 11 08/22/2018 1235   ALT 23 12/14/2016 1159   ALT 14 10/22/2016 1129   BILITOT 0.3 08/22/2018 1235   BILITOT 0.31 10/22/2016 1129         Impression and Plan: [name] is 73 year old gentleman with 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  We will have to start him on chemotherapy.  I will call in the Decadron for him.  Of note, his last TSH was up slightly.  We will have to watch this closely.  I told him that I would give him 3 cycles of treatment and then repeat his scans and see there is a response.  If he does not have a response, then we can try the new FDA approved agent Lurbenectedin.  We will try his treatments and get him started in a couple weeks.  I spent about 45 minutes with him today.  I gave him information sheets about the Taxotere.  I answered all of his questions.  I reviewed the PET scan with him.  I got the protocol into the system.  I will plan to see him back for his second cycle of treatment.     [name], MD 8/19/20203:46 PM

## 2018-09-04 ENCOUNTER — Telehealth: Payer: Self-pay | Admitting: Hematology & Oncology

## 2018-09-04 NOTE — Telephone Encounter (Signed)
Appointments scheduled and I spoke with patient per 8/19 los

## 2018-09-16 ENCOUNTER — Other Ambulatory Visit: Payer: Self-pay | Admitting: Family

## 2018-09-16 DIAGNOSIS — C7951 Secondary malignant neoplasm of bone: Secondary | ICD-10-CM

## 2018-09-16 DIAGNOSIS — C7B8 Other secondary neuroendocrine tumors: Secondary | ICD-10-CM

## 2018-09-16 DIAGNOSIS — C61 Malignant neoplasm of prostate: Secondary | ICD-10-CM

## 2018-09-16 DIAGNOSIS — C7A8 Other malignant neuroendocrine tumors: Secondary | ICD-10-CM

## 2018-09-17 ENCOUNTER — Inpatient Hospital Stay: Payer: Medicare Other | Attending: Hematology & Oncology

## 2018-09-17 ENCOUNTER — Inpatient Hospital Stay: Payer: Medicare Other

## 2018-09-17 ENCOUNTER — Other Ambulatory Visit: Payer: Self-pay | Admitting: Family

## 2018-09-17 ENCOUNTER — Other Ambulatory Visit: Payer: Self-pay

## 2018-09-17 VITALS — BP 137/91 | HR 79 | Temp 98.0°F | Resp 17

## 2018-09-17 DIAGNOSIS — Z885 Allergy status to narcotic agent status: Secondary | ICD-10-CM | POA: Diagnosis not present

## 2018-09-17 DIAGNOSIS — Z79899 Other long term (current) drug therapy: Secondary | ICD-10-CM | POA: Diagnosis not present

## 2018-09-17 DIAGNOSIS — M2550 Pain in unspecified joint: Secondary | ICD-10-CM | POA: Insufficient documentation

## 2018-09-17 DIAGNOSIS — Z5189 Encounter for other specified aftercare: Secondary | ICD-10-CM | POA: Diagnosis not present

## 2018-09-17 DIAGNOSIS — C7B8 Other secondary neuroendocrine tumors: Secondary | ICD-10-CM | POA: Insufficient documentation

## 2018-09-17 DIAGNOSIS — Z5112 Encounter for antineoplastic immunotherapy: Secondary | ICD-10-CM | POA: Insufficient documentation

## 2018-09-17 DIAGNOSIS — C61 Malignant neoplasm of prostate: Secondary | ICD-10-CM | POA: Diagnosis not present

## 2018-09-17 DIAGNOSIS — C7A8 Other malignant neuroendocrine tumors: Secondary | ICD-10-CM

## 2018-09-17 DIAGNOSIS — C7951 Secondary malignant neoplasm of bone: Secondary | ICD-10-CM

## 2018-09-17 DIAGNOSIS — R531 Weakness: Secondary | ICD-10-CM | POA: Diagnosis not present

## 2018-09-17 DIAGNOSIS — Z5111 Encounter for antineoplastic chemotherapy: Secondary | ICD-10-CM | POA: Insufficient documentation

## 2018-09-17 LAB — CMP (CANCER CENTER ONLY)
ALT: 12 U/L (ref 0–44)
AST: 13 U/L — ABNORMAL LOW (ref 15–41)
Albumin: 4.2 g/dL (ref 3.5–5.0)
Alkaline Phosphatase: 50 U/L (ref 38–126)
Anion gap: 8 (ref 5–15)
BUN: 26 mg/dL — ABNORMAL HIGH (ref 8–23)
CO2: 29 mmol/L (ref 22–32)
Calcium: 10.1 mg/dL (ref 8.9–10.3)
Chloride: 101 mmol/L (ref 98–111)
Creatinine: 1.23 mg/dL (ref 0.61–1.24)
GFR, Est AFR Am: >60 mL/min (ref >60)
GFR, Estimated: 59 mL/min — ABNORMAL LOW (ref >60)
Glucose, Bld: 108 mg/dL — ABNORMAL HIGH (ref 70–99)
Potassium: 4.4 mmol/L (ref 3.5–5.1)
Sodium: 138 mmol/L (ref 135–145)
Total Bilirubin: 0.2 mg/dL — ABNORMAL LOW (ref 0.3–1.2)
Total Protein: 7 g/dL (ref 6.5–8.1)

## 2018-09-17 LAB — CBC WITH DIFFERENTIAL (CANCER CENTER ONLY)
Abs Immature Granulocytes: 0.12 10*3/uL — ABNORMAL HIGH (ref 0.00–0.07)
Basophils Absolute: 0 10*3/uL (ref 0.0–0.1)
Basophils Relative: 0 %
Eosinophils Absolute: 0 10*3/uL (ref 0.0–0.5)
Eosinophils Relative: 0 %
HCT: 36.4 % — ABNORMAL LOW (ref 39.0–52.0)
Hemoglobin: 11.9 g/dL — ABNORMAL LOW (ref 13.0–17.0)
Immature Granulocytes: 1 %
Lymphocytes Relative: 9 %
Lymphs Abs: 1 10*3/uL (ref 0.7–4.0)
MCH: 31.4 pg (ref 26.0–34.0)
MCHC: 32.7 g/dL (ref 30.0–36.0)
MCV: 96 fL (ref 80.0–100.0)
Monocytes Absolute: 1 10*3/uL (ref 0.1–1.0)
Monocytes Relative: 8 %
Neutro Abs: 9.2 10*3/uL — ABNORMAL HIGH (ref 1.7–7.7)
Neutrophils Relative %: 82 %
Platelet Count: 267 10*3/uL (ref 150–400)
RBC: 3.79 MIL/uL — ABNORMAL LOW (ref 4.22–5.81)
RDW: 13.5 % (ref 11.5–15.5)
WBC Count: 11.4 10*3/uL — ABNORMAL HIGH (ref 4.0–10.5)
nRBC: 0 % (ref 0.0–0.2)

## 2018-09-17 MED ORDER — PEGFILGRASTIM 6 MG/0.6ML ~~LOC~~ PSKT
6.0000 mg | PREFILLED_SYRINGE | Freq: Once | SUBCUTANEOUS | Status: AC
  Administered 2018-09-17: 6 mg via SUBCUTANEOUS

## 2018-09-17 MED ORDER — SODIUM CHLORIDE 0.9 % IV SOLN
80.0000 mg/m2 | Freq: Once | INTRAVENOUS | Status: AC
  Administered 2018-09-17: 150 mg via INTRAVENOUS
  Filled 2018-09-17: qty 15

## 2018-09-17 MED ORDER — SODIUM CHLORIDE 0.9 % IV SOLN
Freq: Once | INTRAVENOUS | Status: AC
  Administered 2018-09-17: 13:00:00 via INTRAVENOUS
  Filled 2018-09-17: qty 250

## 2018-09-17 MED ORDER — PEGFILGRASTIM 6 MG/0.6ML ~~LOC~~ PSKT
PREFILLED_SYRINGE | SUBCUTANEOUS | Status: AC
  Filled 2018-09-17: qty 0.6

## 2018-09-17 MED ORDER — SODIUM CHLORIDE 0.9 % IV SOLN
200.0000 mg | Freq: Once | INTRAVENOUS | Status: AC
  Administered 2018-09-17: 200 mg via INTRAVENOUS
  Filled 2018-09-17: qty 8

## 2018-09-17 MED ORDER — SODIUM CHLORIDE 0.9% FLUSH
10.0000 mL | INTRAVENOUS | Status: DC | PRN
  Administered 2018-09-17: 10 mL
  Filled 2018-09-17: qty 10

## 2018-09-17 MED ORDER — DEXAMETHASONE SODIUM PHOSPHATE 10 MG/ML IJ SOLN
INTRAMUSCULAR | Status: AC
  Filled 2018-09-17: qty 1

## 2018-09-17 MED ORDER — DEXAMETHASONE SODIUM PHOSPHATE 10 MG/ML IJ SOLN
10.0000 mg | Freq: Once | INTRAMUSCULAR | Status: AC
  Administered 2018-09-17: 10 mg via INTRAVENOUS

## 2018-09-17 MED ORDER — HEPARIN SOD (PORK) LOCK FLUSH 100 UNIT/ML IV SOLN
500.0000 [IU] | Freq: Once | INTRAVENOUS | Status: AC | PRN
  Administered 2018-09-17: 500 [IU]
  Filled 2018-09-17: qty 5

## 2018-09-17 NOTE — Patient Instructions (Signed)
Implanted Port Insertion, Care After This sheet gives you information about how to care for yourself after your procedure. Your health care provider may also give you more specific instructions. If you have problems or questions, contact your health care provider. What can I expect after the procedure? After the procedure, it is common to have:  Discomfort at the port insertion site.  Bruising on the skin over the port. This should improve over 3-4 days. Follow these instructions at home: Port care  After your port is placed, you will get a manufacturer's information card. The card has information about your port. Keep this card with you at all times.  Take care of the port as told by your health care provider. Ask your health care provider if you or a family member can get training for taking care of the port at home. A home health care nurse may also take care of the port.  Make sure to remember what type of port you have. Incision care      Follow instructions from your health care provider about how to take care of your port insertion site. Make sure you: ? Wash your hands with soap and water before and after you change your bandage (dressing). If soap and water are not available, use hand sanitizer. ? Change your dressing as told by your health care provider. ? Leave stitches (sutures), skin glue, or adhesive strips in place. These skin closures may need to stay in place for 2 weeks or longer. If adhesive strip edges start to loosen and curl up, you may trim the loose edges. Do not remove adhesive strips completely unless your health care provider tells you to do that.  Check your port insertion site every day for signs of infection. Check for: ? Redness, swelling, or pain. ? Fluid or blood. ? Warmth. ? Pus or a bad smell. Activity  Return to your normal activities as told by your health care provider. Ask your health care provider what activities are safe for you.  Do not  lift anything that is heavier than 10 lb (4.5 kg), or the limit that you are told, until your health care provider says that it is safe. General instructions  Take over-the-counter and prescription medicines only as told by your health care provider.  Do not take baths, swim, or use a hot tub until your health care provider approves. Ask your health care provider if you may take showers. You may only be allowed to take sponge baths.  Do not drive for 24 hours if you were given a sedative during your procedure.  Wear a medical alert bracelet in case of an emergency. This will tell any health care providers that you have a port.  Keep all follow-up visits as told by your health care provider. This is important. Contact a health care provider if:  You cannot flush your port with saline as directed, or you cannot draw blood from the port.  You have a fever or chills.  You have redness, swelling, or pain around your port insertion site.  You have fluid or blood coming from your port insertion site.  Your port insertion site feels warm to the touch.  You have pus or a bad smell coming from the port insertion site. Get help right away if:  You have chest pain or shortness of breath.  You have bleeding from your port that you cannot control. Summary  Take care of the port as told by your health   care provider. Keep the manufacturer's information card with you at all times.  Change your dressing as told by your health care provider.  Contact a health care provider if you have a fever or chills or if you have redness, swelling, or pain around your port insertion site.  Keep all follow-up visits as told by your health care provider. This information is not intended to replace advice given to you by your health care provider. Make sure you discuss any questions you have with your health care provider. Document Released: 10/22/2012 Document Revised: 07/30/2017 Document Reviewed: 07/30/2017  Elsevier Patient Education  2020 [employer]  

## 2018-09-17 NOTE — Progress Notes (Signed)
Okay to change Fulphila to OnPro per Otilio Carpen, Financial Advocate.

## 2018-09-17 NOTE — Patient Instructions (Addendum)
Pembrolizumab injection What is this medicine? PEMBROLIZUMAB (pem broe liz ue mab) is a monoclonal antibody. It is used to treat bladder cancer, cervical cancer, endometrial cancer, esophageal cancer, head and neck cancer, hepatocellular cancer, Hodgkin lymphoma, kidney cancer, lymphoma, melanoma, Merkel cell carcinoma, lung cancer, stomach cancer, urothelial cancer, and cancers that have a certain genetic condition. This medicine may be used for other purposes; ask your health care provider or pharmacist if you have questions. COMMON BRAND NAME(S): Keytruda What should I tell my health care provider before I take this medicine? They need to know if you have any of these conditions:  diabetes  immune system problems  inflammatory bowel disease  liver disease  lung or breathing disease  lupus  received or scheduled to receive an organ transplant or a stem-cell transplant that uses donor stem cells  an unusual or allergic reaction to pembrolizumab, other medicines, foods, dyes, or preservatives  pregnant or trying to get pregnant  breast-feeding How should I use this medicine? This medicine is for infusion into a vein. It is given by a health care professional in a hospital or clinic setting. A special MedGuide will be given to you before each treatment. Be sure to read this information carefully each time. Talk to your pediatrician regarding the use of this medicine in children. While this drug may be prescribed for selected conditions, precautions do apply. Overdosage: If you think you have taken too much of this medicine contact a poison control center or emergency room at once. NOTE: This medicine is only for you. Do not share this medicine with others. What if I miss a dose? It is important not to miss your dose. Call your doctor or health care professional if you are unable to keep an appointment. What may interact with this medicine? Interactions have not been studied. Give  your health care provider a list of all the medicines, herbs, non-prescription drugs, or dietary supplements you use. Also tell them if you smoke, drink alcohol, or use illegal drugs. Some items may interact with your medicine. This list may not describe all possible interactions. Give your health care provider a list of all the medicines, herbs, non-prescription drugs, or dietary supplements you use. Also tell them if you smoke, drink alcohol, or use illegal drugs. Some items may interact with your medicine. What should I watch for while using this medicine? Your condition will be monitored carefully while you are receiving this medicine. You may need blood work done while you are taking this medicine. Do not become pregnant while taking this medicine or for 4 months after stopping it. Women should inform their doctor if they wish to become pregnant or think they might be pregnant. There is a potential for serious side effects to an unborn child. Talk to your health care professional or pharmacist for more information. Do not breast-feed an infant while taking this medicine or for 4 months after the last dose. What side effects may I notice from receiving this medicine? Side effects that you should report to your doctor or health care professional as soon as possible:  allergic reactions like skin rash, itching or hives, swelling of the face, lips, or tongue  bloody or black, tarry  breathing problems  changes in vision  chest pain  chills  confusion  constipation  cough  diarrhea  dizziness or feeling faint or lightheaded  fast or irregular heartbeat  fever  flushing  hair loss  joint pain  low blood counts - this  medicine may decrease the number of white blood cells, red blood cells and platelets. You may be at increased risk for infections and bleeding.  muscle pain  muscle weakness  persistent headache  redness, blistering, peeling or loosening of the skin,  including inside the mouth  signs and symptoms of high blood sugar such as dizziness; dry mouth; dry skin; fruity breath; nausea; stomach pain; increased hunger or thirst; increased urination  signs and symptoms of kidney injury like trouble passing urine or change in the amount of urine  signs and symptoms of liver injury like dark urine, light-colored stools, loss of appetite, nausea, right upper belly pain, yellowing of the eyes or skin  sweating  swollen lymph nodes  weight loss Side effects that usually do not require medical attention (report to your doctor or health care professional if they continue or are bothersome):  decreased appetite  muscle pain  tiredness This list may not describe all possible side effects. Call your doctor for medical advice about side effects. You may report side effects to FDA at 1-800-FDA-1088. Where should I keep my medicine? This drug is given in a hospital or clinic and will not be stored at home. NOTE: This sheet is a summary. It may not cover all possible information. If you have questions about this medicine, talk to your doctor, pharmacist, or health care provider.  2020 Elsevier/Gold Standard (2018-01-28 13:46:58) Docetaxel injection What is this medicine? DOCETAXEL (doe se TAX el) is a chemotherapy drug. It targets fast dividing cells, like cancer cells, and causes these cells to die. This medicine is used to treat many types of cancers like breast cancer, certain stomach cancers, head and neck cancer, lung cancer, and prostate cancer. This medicine may be used for other purposes; ask your health care provider or pharmacist if you have questions. COMMON BRAND NAME(S): Docefrez, Taxotere What should I tell my health care provider before I take this medicine? They need to know if you have any of these conditions:  infection (especially a virus infection such as chickenpox, cold sores, or herpes)  liver disease  low blood counts, like  low white cell, platelet, or red cell counts  an unusual or allergic reaction to docetaxel, polysorbate 80, other chemotherapy agents, other medicines, foods, dyes, or preservatives  pregnant or trying to get pregnant  breast-feeding How should I use this medicine? This drug is given as an infusion into a vein. It is administered in a hospital or clinic by a specially trained health care professional. Talk to your pediatrician regarding the use of this medicine in children. Special care may be needed. Overdosage: If you think you have taken too much of this medicine contact a poison control center or emergency room at once. NOTE: This medicine is only for you. Do not share this medicine with others. What if I miss a dose? It is important not to miss your dose. Call your doctor or health care professional if you are unable to keep an appointment. What may interact with this medicine?  aprepitant  certain antibiotics like erythromycin or clarithromycin  certain antivirals for HIV or hepatitis  certain medicines for fungal infections like fluconazole, itraconazole, ketoconazole, posaconazole, or voriconazole  cimetidine  ciprofloxacin  conivaptan  cyclosporine  dronedarone  fluvoxamine  grapefruit juice  imatinib  verapamil This list may not describe all possible interactions. Give your health care provider a list of all the medicines, herbs, non-prescription drugs, or dietary supplements you use. Also tell them if you  smoke, drink alcohol, or use illegal drugs. Some items may interact with your medicine. What should I watch for while using this medicine? Your condition will be monitored carefully while you are receiving this medicine. You will need important blood work done while you are taking this medicine. Call your doctor or health care professional for advice if you get a fever, chills or sore throat, or other symptoms of a cold or flu. Do not treat yourself. This drug  decreases your body's ability to fight infections. Try to avoid being around people who are sick. Some products may contain alcohol. Ask your health care professional if this medicine contains alcohol. Be sure to tell all health care professionals you are taking this medicine. Certain medicines, like metronidazole and disulfiram, can cause an unpleasant reaction when taken with alcohol. The reaction includes flushing, headache, nausea, vomiting, sweating, and increased thirst. The reaction can last from 30 minutes to several hours. You may get drowsy or dizzy. Do not drive, use machinery, or do anything that needs mental alertness until you know how this medicine affects you. Do not stand or sit up quickly, especially if you are an older patient. This reduces the risk of dizzy or fainting spells. Alcohol may interfere with the effect of this medicine. Talk to your health care professional about your risk of cancer. You may be more at risk for certain types of cancer if you take this medicine. Do not become pregnant while taking this medicine or for 6 months after stopping it. Women should inform their doctor if they wish to become pregnant or think they might be pregnant. There is a potential for serious side effects to an unborn child. Talk to your health care professional or pharmacist for more information. Do not breast-feed an infant while taking this medicine or for 1 to 2 weeks after stopping it. Males who get this medicine must use a condom during sex with females who can get pregnant. If you get a woman pregnant, the baby could have birth defects. The baby could die before they are born. You will need to continue wearing a condom for 3 months after stopping the medicine. Tell your health care provider right away if your partner becomes pregnant while you are taking this medicine. This may interfere with the ability to father a child. You should talk to your doctor or health care professional if you are  concerned about your fertility. What side effects may I notice from receiving this medicine? Side effects that you should report to your doctor or health care professional as soon as possible:  allergic reactions like skin rash, itching or hives, swelling of the face, lips, or tongue  blurred vision  breathing problems  changes in vision  low blood counts - This drug may decrease the number of white blood cells, red blood cells and platelets. You may be at increased risk for infections and bleeding.  nausea and vomiting  pain, redness or irritation at site where injected  pain, tingling, numbness in the hands or feet  redness, blistering, peeling, or loosening of the skin, including inside the mouth  signs of decreased platelets or bleeding - bruising, pinpoint red spots on the skin, black, tarry stools, nosebleeds  signs of decreased red blood cells - unusually weak or tired, fainting spells, lightheadedness  signs of infection - fever or chills, cough, sore throat, pain or difficulty passing urine  swelling of the ankle, feet, hands Side effects that usually do not require medical  attention (report to your doctor or health care professional if they continue or are bothersome):  constipation  diarrhea  fingernail or toenail changes  hair loss  loss of appetite  mouth sores  muscle pain This list may not describe all possible side effects. Call your doctor for medical advice about side effects. You may report side effects to FDA at 1-800-FDA-1088. Where should I keep my medicine? This drug is given in a hospital or clinic and will not be stored at home. NOTE: This sheet is a summary. It may not cover all possible information. If you have questions about this medicine, talk to your doctor, pharmacist, or health care provider.  2020 Elsevier/Gold Standard (2018-03-07 12:23:11) Dexamethasone injection What is this medicine? DEXAMETHASONE (dex a METH a sone) is a  corticosteroid. It is used to treat inflammation of the skin, joints, lungs, and other organs. Common conditions treated include asthma, allergies, and arthritis. It is also used for other conditions, like blood disorders and diseases of the adrenal glands. This medicine may be used for other purposes; ask your health care provider or pharmacist if you have questions. COMMON BRAND NAME(S): Decadron, DoubleDex, Simplist Dexamethasone, Solurex What should I tell my health care provider before I take this medicine? They need to know if you have any of these conditions:  blood clotting problems  Cushing's syndrome  diabetes  glaucoma  heart problems or disease  high blood pressure  infection like herpes, measles, tuberculosis, or chickenpox  kidney disease  liver disease  mental problems  myasthenia gravis  osteoporosis  previous heart attack  seizures  stomach, ulcer or intestine disease including colitis and diverticulitis  thyroid problem  an unusual or allergic reaction to dexamethasone, corticosteroids, other medicines, lactose, foods, dyes, or preservatives  pregnant or trying to get pregnant  breast-feeding How should I use this medicine? This medicine is for injection into a muscle, joint, lesion, soft tissue, or vein. It is given by a health care professional in a hospital or clinic setting. Talk to your pediatrician regarding the use of this medicine in children. Special care may be needed. Overdosage: If you think you have taken too much of this medicine contact a poison control center or emergency room at once. NOTE: This medicine is only for you. Do not share this medicine with others. What if I miss a dose? This may not apply. If you are having a series of injections over a prolonged period, try not to miss an appointment. Call your doctor or health care professional to reschedule if you are unable to keep an appointment. What may interact with this  medicine? Do not take this medicine with any of the following medications:  mifepristone, RU-486  vaccines This medicine may also interact with the following medications:  amphotericin B  antibiotics like clarithromycin, erythromycin, and troleandomycin  aspirin and aspirin-like drugs  barbiturates like phenobarbital  carbamazepine  cholestyramine  cholinesterase inhibitors like donepezil, galantamine, rivastigmine, and tacrine  cyclosporine  digoxin  diuretics  ephedrine  male hormones, like estrogens or progestins and birth control pills  indinavir  isoniazid  ketoconazole  medicines for diabetes  medicines that improve muscle tone or strength for conditions like myasthenia gravis  NSAIDs, medicines for pain and inflammation, like ibuprofen or naproxen  phenytoin  rifampin  thalidomide  warfarin This list may not describe all possible interactions. Give your health care provider a list of all the medicines, herbs, non-prescription drugs, or dietary supplements you use. Also tell them if  you smoke, drink alcohol, or use illegal drugs. Some items may interact with your medicine. What should I watch for while using this medicine? Your condition will be monitored carefully while you are receiving this medicine. If you are taking this medicine for a long time, carry an identification card with your name and address, the type and dose of your medicine, and your doctor's name and address. This medicine may increase your risk of getting an infection. Stay away from people who are sick. Tell your doctor or health care professional if you are around anyone with measles or chickenpox. Talk to your health care provider before you get any vaccines that you take this medicine. If you are going to have surgery, tell your doctor or health care professional that you have taken this medicine within the last twelve months. Ask your doctor or health care professional about  your diet. You may need to lower the amount of salt you eat. This medicine may increase blood sugar. Ask your healthcare provider if changes in diet or medicines are needed if you have diabetes. What side effects may I notice from receiving this medicine? Side effects that you should report to your doctor or health care professional as soon as possible:  allergic reactions like skin rash, itching or hives, swelling of the face, lips, or tongue  black or tarry stools  change in the amount of urine  confusion, excitement, restlessness, a false sense of well-being  fever, sore throat, sneezing, cough, or other signs of infection, wounds that will not heal  hallucinations  mental depression, mood swings, mistaken feelings of self importance or of being mistreated  pain in hips, back, ribs, arms, shoulders, or legs  pain, redness, or irritation at the injection site  redness, blistering, peeling or loosening of the skin, including inside the mouth  rounding out of face   signs and symptoms of high blood sugar such as being more thirsty or hungry or having to urinate more than normal. You may also feel very tired or have blurry vision.  swelling of feet or lower legs  unusual bleeding or bruising  wounds that do not heal Side effects that usually do not require medical attention (report to your doctor or health care professional if they continue or are bothersome):  diarrhea or constipation  change in taste  headache  nausea, vomiting  skin problems, acne, thin and shiny skin  trouble sleeping  unusual growth of hair on the face or body  weight gain This list may not describe all possible side effects. Call your doctor for medical advice about side effects. You may report side effects to FDA at 1-800-FDA-1088. Where should I keep my medicine? This drug is given in a hospital or clinic and will not be stored at home. NOTE: This sheet is a summary. It may not cover all  possible information. If you have questions about this medicine, talk to your doctor, pharmacist, or health care provider.  2020 Elsevier/Gold Standard (2017-10-02 10:35:53)

## 2018-09-19 ENCOUNTER — Inpatient Hospital Stay: Payer: Medicare Other

## 2018-09-23 ENCOUNTER — Ambulatory Visit: Payer: Medicare Other | Admitting: Hematology & Oncology

## 2018-09-23 ENCOUNTER — Other Ambulatory Visit: Payer: Medicare Other

## 2018-09-23 ENCOUNTER — Other Ambulatory Visit: Payer: Self-pay | Admitting: Hematology & Oncology

## 2018-09-23 ENCOUNTER — Ambulatory Visit: Payer: Medicare Other

## 2018-09-23 ENCOUNTER — Telehealth: Payer: Self-pay | Admitting: *Deleted

## 2018-09-23 MED ORDER — PANTOPRAZOLE SODIUM 40 MG PO TBEC: 40.0000 mg | DELAYED_RELEASE_TABLET | Freq: Every day | ORAL | 2 refills | Status: DC

## 2018-09-23 NOTE — Telephone Encounter (Signed)
Message received from patient stating that he has been having indigestion at night and is taking Pepcid which is "useless" and would like to know if he can take Protonix.  Dr. Marin Olp notified and would like for pt to stop Pepcid and take Protonix 40 mg daily at HS.  Call placed back to patient and patient notified per order of Dr. Marin Olp to stop Pepcid and start Protonix 40 mg daily at HS.  Pt appreciative of call back and requests that prescription be sent to [facility].

## 2018-09-26 ENCOUNTER — Other Ambulatory Visit: Payer: Medicare Other

## 2018-09-26 ENCOUNTER — Ambulatory Visit: Payer: Medicare Other | Admitting: Hematology & Oncology

## 2018-09-26 ENCOUNTER — Telehealth: Payer: Self-pay | Admitting: *Deleted

## 2018-09-26 ENCOUNTER — Ambulatory Visit: Payer: Medicare Other

## 2018-09-26 NOTE — Telephone Encounter (Signed)
Message received from patient stating that "this medicine is wreaking havoc" on him and that he is having difficulty sleeping, waking up every hour and would like to know if he can take Melatonin.  Call placed back to patient and patient notified per order of Dr. Marin Olp that he can take Melatonin for sleep as needed.  Pt appreciative of call back and has no further questions or concerns at this time.

## 2018-10-01 ENCOUNTER — Other Ambulatory Visit: Payer: Self-pay | Admitting: Hematology & Oncology

## 2018-10-01 DIAGNOSIS — N481 Balanitis: Secondary | ICD-10-CM | POA: Diagnosis not present

## 2018-10-01 DIAGNOSIS — R319 Hematuria, unspecified: Secondary | ICD-10-CM | POA: Diagnosis not present

## 2018-10-01 MED ORDER — FLUCONAZOLE 100 MG PO TABS: 100.0000 mg | ORAL_TABLET | Freq: Every day | ORAL | 6 refills | Status: DC

## 2018-10-08 ENCOUNTER — Other Ambulatory Visit: Payer: Self-pay

## 2018-10-08 ENCOUNTER — Inpatient Hospital Stay: Payer: Medicare Other

## 2018-10-08 ENCOUNTER — Encounter: Payer: Self-pay | Admitting: Hematology & Oncology

## 2018-10-08 ENCOUNTER — Inpatient Hospital Stay (HOSPITAL_BASED_OUTPATIENT_CLINIC_OR_DEPARTMENT_OTHER): Payer: Medicare Other | Admitting: Hematology & Oncology

## 2018-10-08 VITALS — BP 120/80 | HR 78 | Temp 97.2°F | Resp 20 | Wt 154.8 lb

## 2018-10-08 DIAGNOSIS — Z5112 Encounter for antineoplastic immunotherapy: Secondary | ICD-10-CM | POA: Diagnosis not present

## 2018-10-08 DIAGNOSIS — C7A8 Other malignant neuroendocrine tumors: Secondary | ICD-10-CM

## 2018-10-08 DIAGNOSIS — C7B8 Other secondary neuroendocrine tumors: Secondary | ICD-10-CM

## 2018-10-08 DIAGNOSIS — C7951 Secondary malignant neoplasm of bone: Secondary | ICD-10-CM

## 2018-10-08 DIAGNOSIS — C61 Malignant neoplasm of prostate: Secondary | ICD-10-CM | POA: Diagnosis not present

## 2018-10-08 DIAGNOSIS — M2550 Pain in unspecified joint: Secondary | ICD-10-CM | POA: Diagnosis not present

## 2018-10-08 DIAGNOSIS — Z5111 Encounter for antineoplastic chemotherapy: Secondary | ICD-10-CM | POA: Diagnosis not present

## 2018-10-08 DIAGNOSIS — Z5189 Encounter for other specified aftercare: Secondary | ICD-10-CM | POA: Diagnosis not present

## 2018-10-08 LAB — CBC WITH DIFFERENTIAL (CANCER CENTER ONLY)
Abs Immature Granulocytes: 0.44 10*3/uL — ABNORMAL HIGH (ref 0.00–0.07)
Basophils Absolute: 0 10*3/uL (ref 0.0–0.1)
Basophils Relative: 0 %
Eosinophils Absolute: 0 10*3/uL (ref 0.0–0.5)
Eosinophils Relative: 0 %
HCT: 34 % — ABNORMAL LOW (ref 39.0–52.0)
Hemoglobin: 10.9 g/dL — ABNORMAL LOW (ref 13.0–17.0)
Immature Granulocytes: 3 %
Lymphocytes Relative: 7 %
Lymphs Abs: 0.9 10*3/uL (ref 0.7–4.0)
MCH: 31.3 pg (ref 26.0–34.0)
MCHC: 32.1 g/dL (ref 30.0–36.0)
MCV: 97.7 fL (ref 80.0–100.0)
Monocytes Absolute: 0.7 10*3/uL (ref 0.1–1.0)
Monocytes Relative: 5 %
Neutro Abs: 12.1 10*3/uL — ABNORMAL HIGH (ref 1.7–7.7)
Neutrophils Relative %: 85 %
Platelet Count: 340 10*3/uL (ref 150–400)
RBC: 3.48 MIL/uL — ABNORMAL LOW (ref 4.22–5.81)
RDW: 14.7 % (ref 11.5–15.5)
WBC Count: 14.1 10*3/uL — ABNORMAL HIGH (ref 4.0–10.5)
nRBC: 0 % (ref 0.0–0.2)

## 2018-10-08 LAB — CMP (CANCER CENTER ONLY)
ALT: 11 U/L (ref 0–44)
AST: 13 U/L — ABNORMAL LOW (ref 15–41)
Albumin: 4.2 g/dL (ref 3.5–5.0)
Alkaline Phosphatase: 63 U/L (ref 38–126)
Anion gap: 10 (ref 5–15)
BUN: 27 mg/dL — ABNORMAL HIGH (ref 8–23)
CO2: 22 mmol/L (ref 22–32)
Calcium: 9.4 mg/dL (ref 8.9–10.3)
Chloride: 105 mmol/L (ref 98–111)
Creatinine: 1.21 mg/dL (ref 0.61–1.24)
GFR, Est AFR Am: >60 mL/min (ref >60)
GFR, Estimated: >60 mL/min (ref >60)
Glucose, Bld: 177 mg/dL — ABNORMAL HIGH (ref 70–99)
Potassium: 4 mmol/L (ref 3.5–5.1)
Sodium: 137 mmol/L (ref 135–145)
Total Bilirubin: 0.3 mg/dL (ref 0.3–1.2)
Total Protein: 7 g/dL (ref 6.5–8.1)

## 2018-10-08 LAB — LACTATE DEHYDROGENASE: LDH: 188 U/L (ref 98–192)

## 2018-10-08 LAB — TSH: TSH: 0.633 u[IU]/mL (ref 0.320–4.118)

## 2018-10-08 MED ORDER — SOLIFENACIN SUCCINATE 10 MG PO TABS: 10.0000 mg | ORAL_TABLET | Freq: Every day | ORAL | 5 refills | Status: DC

## 2018-10-08 MED ORDER — DEXAMETHASONE SODIUM PHOSPHATE 10 MG/ML IJ SOLN
INTRAMUSCULAR | Status: AC
  Filled 2018-10-08: qty 1

## 2018-10-08 MED ORDER — SODIUM CHLORIDE 0.9 % IV SOLN
200.0000 mg | Freq: Once | INTRAVENOUS | Status: AC
  Administered 2018-10-08: 200 mg via INTRAVENOUS
  Filled 2018-10-08: qty 8

## 2018-10-08 MED ORDER — DEXAMETHASONE SODIUM PHOSPHATE 10 MG/ML IJ SOLN
10.0000 mg | Freq: Once | INTRAMUSCULAR | Status: AC
  Administered 2018-10-08: 10 mg via INTRAVENOUS

## 2018-10-08 MED ORDER — PROCHLORPERAZINE MALEATE 10 MG PO TABS
ORAL_TABLET | ORAL | Status: AC
  Filled 2018-10-08: qty 1

## 2018-10-08 MED ORDER — SUCRALFATE 1 GM/10ML PO SUSP: 1.0000 g | Freq: Three times a day (TID) | ORAL | 5 refills | Status: DC

## 2018-10-08 MED ORDER — HEPARIN SOD (PORK) LOCK FLUSH 100 UNIT/ML IV SOLN
500.0000 [IU] | Freq: Once | INTRAVENOUS | Status: AC | PRN
  Administered 2018-10-08: 500 [IU]
  Filled 2018-10-08: qty 5

## 2018-10-08 MED ORDER — SODIUM CHLORIDE 0.9% FLUSH
10.0000 mL | Freq: Once | INTRAVENOUS | Status: AC
  Administered 2018-10-08: 10 mL
  Filled 2018-10-08: qty 10

## 2018-10-08 MED ORDER — PEGFILGRASTIM 6 MG/0.6ML ~~LOC~~ PSKT
6.0000 mg | PREFILLED_SYRINGE | Freq: Once | SUBCUTANEOUS | Status: AC
  Administered 2018-10-08: 6 mg via SUBCUTANEOUS

## 2018-10-08 MED ORDER — SODIUM CHLORIDE 0.9% FLUSH
10.0000 mL | INTRAVENOUS | Status: DC | PRN
  Administered 2018-10-08: 10 mL
  Filled 2018-10-08: qty 10

## 2018-10-08 MED ORDER — PROCHLORPERAZINE MALEATE 10 MG PO TABS
10.0000 mg | ORAL_TABLET | Freq: Once | ORAL | Status: AC
  Administered 2018-10-08: 10 mg via ORAL

## 2018-10-08 MED ORDER — PEGFILGRASTIM 6 MG/0.6ML ~~LOC~~ PSKT
PREFILLED_SYRINGE | SUBCUTANEOUS | Status: AC
  Filled 2018-10-08: qty 0.6

## 2018-10-08 MED ORDER — SODIUM CHLORIDE 0.9 % IV SOLN
Freq: Once | INTRAVENOUS | Status: AC
  Administered 2018-10-08: 12:00:00 via INTRAVENOUS
  Filled 2018-10-08: qty 250

## 2018-10-08 MED ORDER — SODIUM CHLORIDE 0.9 % IV SOLN
80.0000 mg/m2 | Freq: Once | INTRAVENOUS | Status: AC
  Administered 2018-10-08: 150 mg via INTRAVENOUS
  Filled 2018-10-08: qty 15

## 2018-10-08 MED FILL — DEXAMETHASONE 4 MG TABLET: 4 | 15 days supply | Qty: 60 | Fill #1

## 2018-10-08 MED FILL — SUCRALFATE 1 GM/10ML SUSP: 1 | 11 days supply | Qty: 420 | Fill #0

## 2018-10-08 MED FILL — SOLIFENACIN SUCCINATE 10 MG: 10 | 30 days supply | Qty: 30 | Fill #0

## 2018-10-08 MED FILL — GABAPENTIN 300 MG CAPSULE: 300 | 30 days supply | Qty: 120 | Fill #1

## 2018-10-08 NOTE — Patient Instructions (Signed)

## 2018-10-08 NOTE — Patient Instructions (Addendum)
[facility] Discharge Instructions for Patients Receiving Chemotherapy  Today you received the following chemotherapy agents Keytruda//Taxotere To help prevent nausea and vomiting after your treatment, we encourage you to take your nausea medication as prescribed.   If you develop nausea and vomiting that is not controlled by your nausea medication, call the clinic.   BELOW ARE SYMPTOMS THAT SHOULD BE REPORTED IMMEDIATELY:  *FEVER GREATER THAN 100.5 F  *CHILLS WITH OR WITHOUT FEVER  NAUSEA AND VOMITING THAT IS NOT CONTROLLED WITH YOUR NAUSEA MEDICATION  *UNUSUAL SHORTNESS OF BREATH  *UNUSUAL BRUISING OR BLEEDING  TENDERNESS IN MOUTH AND THROAT WITH OR WITHOUT PRESENCE OF ULCERS  *URINARY PROBLEMS  *BOWEL PROBLEMS  UNUSUAL RASH Items with * indicate a potential emergency and should be followed up as soon as possible.  Feel free to call the clinic should you have any questions or concerns. The clinic phone number is [phone].  Please show the [place] at check-in to the Emergency Department and triage nurse.

## 2018-10-08 NOTE — Progress Notes (Signed)
Hematology and Oncology Follow Up Visit  [name] [id number] 23-Feb-1945 73 y.o. 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 is back for follow-up.  He did quite well with his first cycle of treatment.  He did have an issue with respect to a fungal infection down in the inguinal area.  This has been treated with Diflucan.  He has had no problems with cough or shortness of breath.  He has had no nausea or vomiting.  He has had no diarrhea.  He has had no bleeding.  There is been no problems with rashes outside that with the inguinal area.  He has had no headache.  Overall, I would have to say his performance status is ECOG 1.      Medications:  Current Outpatient Medications:  .  acetaminophen (TYLENOL) 500 MG tablet, Take 1,000 mg by mouth every 4 (four) hours as needed for moderate pain., Disp: , Rfl:  .  aspirin 81 MG tablet, Take 81 mg by mouth daily., Disp: , Rfl:  .  CALCIUM-IRON-VIT D-VIT K PO, Take 1 tablet by mouth 2 (two) times daily., Disp: , Rfl:  .  docusate sodium (COLACE) 100 MG capsule, Take 200 mg by mouth 2 (two) times daily., Disp: , Rfl:  .  fluconazole (DIFLUCAN) 100 MG tablet, Take 1 tablet (100 mg total) by mouth daily., Disp: 30 tablet, Rfl: 6 .   gabapentin (NEURONTIN) 300 MG capsule, TAKE 1 CAPSULE BY MOUTH TWICE A DAY THEN TAKE 2 CAPSULES AT BEDTIME (Patient taking differently: Take 300 mg by mouth 2 (two) times daily. ), Disp: 120 capsule, Rfl: 4 .  isosorbide mononitrate (IMDUR) 30 MG 24 hr tablet, Take 30 mg by mouth daily., Disp: , Rfl:  .  OVER THE COUNTER MEDICATION, every morning. Juice Plus -- 8 capsule of each the garden, vineyard, and orchard twice a day., Disp: , Rfl:  .  pantoprazole (PROTONIX) 40 MG tablet, Take 1 tablet (40 mg total) by mouth at bedtime., Disp: 30 tablet, Rfl: 2 .  solifenacin (VESICARE) 5 MG tablet, TAKE 1 TABLET BY MOUTH DAILY., Disp: 30 tablet, Rfl: 4 .  ZETIA 10 MG tablet, Take 10 mg by mouth daily. , Disp: , Rfl:  .  isosorbide mononitrate (IMDUR) 30 MG 24 hr tablet, Take 1 tablet (30 mg total) by mouth daily., Disp: 90 tablet, Rfl: 1 .  nitroGLYCERIN (NITROSTAT) 0.4 MG SL tablet, Place 1 tablet (0.4 mg total) under the tongue every 5 (five) minutes as needed for chest pain., Disp: 25 tablet, Rfl: 11 .  ondansetron (ZOFRAN) 8 MG tablet, Take 1 tablet (8 mg total) by mouth 2 (two) times daily as needed for nausea or  vomiting. (Patient not taking: Reported on 10/08/2018), Disp: 20 tablet, Rfl: 0 No current facility-administered medications for this visit.   Facility-Administered Medications Ordered in Other Visits:  .  octreotide (SANDOSTATIN LAR) IM injection 30 mg, 30 mg, Intramuscular, Once, [name], Michael Cobb, MD  Allergies:  Allergies  Allergen Reactions  . Codeine Nausea And Vomiting  . Hydrocodone Nausea And Vomiting    Past Medical History, Surgical history, Social history, and Family History were reviewed and updated.  Review of Systems: Review of Systems  Constitutional: Negative.   HENT: Negative.   Eyes: Negative.   Respiratory: Negative.   Cardiovascular: Negative.   Gastrointestinal: Negative.   Genitourinary: Negative.  Negative for urgency.  Musculoskeletal: Positive for joint  pain.  Skin: Negative.   Neurological: Positive for focal weakness.  Endo/Heme/Allergies: Negative.   Psychiatric/Behavioral: Negative.   All other systems reviewed and are negative.    Physical Exam:  weight is 154 lb 12.8 oz (70.2 kg). His oral temperature is 97.2 F (36.2 C) (abnormal). His blood pressure is 120/80 and his pulse is 78. His respiration is 20 and oxygen saturation is 100%.   Physical Exam Vitals signs reviewed.  HENT:     Head: Normocephalic and atraumatic.  Eyes:     Pupils: Pupils are equal, round, and reactive to light.  Neck:     Musculoskeletal: Normal range of motion.  Cardiovascular:     Rate and Rhythm: Normal rate and regular rhythm.     Heart sounds: Normal heart sounds.  Pulmonary:     Effort: Pulmonary effort is normal.     Breath sounds: Normal breath sounds.  Abdominal:     General: Bowel sounds are normal.     Palpations: Abdomen is soft.  Musculoskeletal: Normal range of motion.        General: No tenderness or deformity.  Lymphadenopathy:     Cervical: No cervical adenopathy.  Skin:    General: Skin is warm and dry.     Findings: No erythema or rash.  Neurological:     Mental Status: He is alert and oriented to person, place, and time.  Psychiatric:        Behavior: Behavior normal.        Thought Content: Thought content normal.        Judgment: Judgment normal.      Lab Results  Component Value Date   WBC 14.1 (H) 10/08/2018   HGB 10.9 (L) 10/08/2018   HCT 34.0 (L) 10/08/2018   MCV 97.7 10/08/2018   PLT 340 10/08/2018     Chemistry      Component Value Date/Time   NA 137 10/08/2018 1008   NA 144 12/14/2016 1159   NA 140 10/22/2016 1129   K 4.0 10/08/2018 1008   K 3.5 12/14/2016 1159   K 4.3 10/22/2016 1129   CL 105 10/08/2018 1008   CL 103 12/14/2016 1159   CO2 22 10/08/2018 1008   CO2 30 12/14/2016 1159   CO2 27 10/22/2016 1129   BUN 27 (H) 10/08/2018 1008   BUN 16 12/14/2016 1159   BUN 14.0 10/22/2016 1129    CREATININE 1.21 10/08/2018 1008   CREATININE 1.3 (H) 12/14/2016 1159   CREATININE 1.1 10/22/2016 1129      Component Value Date/Time   CALCIUM 9.4 10/08/2018 1008   CALCIUM 10.0 12/14/2016 1159   CALCIUM 10.4 10/22/2016 1129   ALKPHOS 63 10/08/2018 1008   ALKPHOS 48 12/14/2016 1159   ALKPHOS 60 10/22/2016 1129  AST 13 (L) 10/08/2018 1008   AST 16 10/22/2016 1129   ALT 11 10/08/2018 1008   ALT 23 12/14/2016 1159   ALT 14 10/22/2016 1129   BILITOT 0.3 10/08/2018 1008   BILITOT 0.31 10/22/2016 1129         Impression and Plan: Mr. 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  We will go ahead with a second cycle of treatment.  After this cycle, I will repeat his PET scan and we will see how everything looks.  Hopefully, he is responding.  I will make sure that he gets his Keytruda.  I will plan to see him back in 3 weeks.  We will get the PET scan in 2 weeks.   [name], MD 9/23/202011:22 AM

## 2018-10-08 NOTE — Addendum Note (Signed)
Addended by: Burney Gauze R on: 10/08/2018 01:12 PM   Modules accepted: Orders

## 2018-10-10 ENCOUNTER — Ambulatory Visit: Payer: Medicare Other

## 2018-10-10 LAB — CHROMOGRANIN A: Chromogranin A (ng/mL): 155.1 ng/mL — ABNORMAL HIGH (ref 0.0–101.8)

## 2018-10-14 LAB — PSA, TOTAL AND FREE
PSA, Free Pct: UNDETERMINED %
PSA, Free: <0.02 ng/mL
Prostate Specific Ag, Serum: <0.1 ng/mL (ref 0.0–4.0)

## 2018-10-15 ENCOUNTER — Telehealth: Payer: Self-pay | Admitting: *Deleted

## 2018-10-15 NOTE — Telephone Encounter (Signed)
Call received from patient requesting a referral to a urologist from Dr. Marin Olp.  Pt notified to contact his insurance company to see if they require a referral come from his PCP.  Pt states that he will contact his insurance company and call this office back if we can be of any assistance to him.

## 2018-10-27 ENCOUNTER — Emergency Department: Payer: Self-pay

## 2018-10-27 ENCOUNTER — Emergency Department
Admission: EM | Admit: 2018-10-27 | Discharge: 2018-10-28 | Disposition: A | Discharge status: Home / Self Care | Payer: Self-pay | Attending: Emergency Medicine | Admitting: Emergency Medicine

## 2018-10-27 DIAGNOSIS — E785 Hyperlipidemia, unspecified: Secondary | ICD-10-CM | POA: Insufficient documentation

## 2018-10-27 DIAGNOSIS — Z981 Arthrodesis status: Secondary | ICD-10-CM | POA: Insufficient documentation

## 2018-10-27 DIAGNOSIS — Z7982 Long term (current) use of aspirin: Secondary | ICD-10-CM | POA: Insufficient documentation

## 2018-10-27 DIAGNOSIS — G2581 Restless legs syndrome: Secondary | ICD-10-CM | POA: Insufficient documentation

## 2018-10-27 DIAGNOSIS — K5730 Diverticulosis of large intestine without perforation or abscess without bleeding: Secondary | ICD-10-CM | POA: Insufficient documentation

## 2018-10-27 DIAGNOSIS — I10 Essential (primary) hypertension: Secondary | ICD-10-CM | POA: Insufficient documentation

## 2018-10-27 DIAGNOSIS — N400 Enlarged prostate without lower urinary tract symptoms: Secondary | ICD-10-CM | POA: Insufficient documentation

## 2018-10-27 DIAGNOSIS — Z91138 Patient's unintentional underdosing of medication regimen for other reason: Secondary | ICD-10-CM | POA: Insufficient documentation

## 2018-10-27 DIAGNOSIS — N200 Calculus of kidney: Secondary | ICD-10-CM | POA: Insufficient documentation

## 2018-10-27 DIAGNOSIS — N281 Cyst of kidney, acquired: Secondary | ICD-10-CM | POA: Insufficient documentation

## 2018-10-27 DIAGNOSIS — I499 Cardiac arrhythmia, unspecified: Secondary | ICD-10-CM | POA: Insufficient documentation

## 2018-10-27 DIAGNOSIS — D3501 Benign neoplasm of right adrenal gland: Secondary | ICD-10-CM | POA: Insufficient documentation

## 2018-10-27 DIAGNOSIS — Z7984 Long term (current) use of oral hypoglycemic drugs: Secondary | ICD-10-CM | POA: Insufficient documentation

## 2018-10-27 DIAGNOSIS — E1140 Type 2 diabetes mellitus with diabetic neuropathy, unspecified: Secondary | ICD-10-CM | POA: Insufficient documentation

## 2018-10-27 DIAGNOSIS — T426X6A Underdosing of other antiepileptic and sedative-hypnotic drugs, initial encounter: Secondary | ICD-10-CM | POA: Insufficient documentation

## 2018-10-27 DIAGNOSIS — Z87442 Personal history of urinary calculi: Secondary | ICD-10-CM | POA: Insufficient documentation

## 2018-10-27 DIAGNOSIS — R109 Unspecified abdominal pain: Secondary | ICD-10-CM

## 2018-10-27 HISTORY — DX: Restless legs syndrome: G25.81

## 2018-10-27 HISTORY — DX: Hyperlipidemia, unspecified: E78.5

## 2018-10-27 HISTORY — DX: Cardiac arrhythmia, unspecified: I49.9

## 2018-10-27 HISTORY — DX: Essential (primary) hypertension: I10

## 2018-10-27 LAB — URINALYSIS REFLEX TO MICROSCOPIC EXAM - REFLEX TO CULTURE
Bilirubin, UA: NEGATIVE
Bilirubin, UA: NEGATIVE
Blood, UA: NEGATIVE
Blood, UA: NEGATIVE
Glucose, UA: NEGATIVE
Glucose, UA: NEGATIVE
Ketones UA: NEGATIVE
Ketones UA: NEGATIVE
Leukocyte Esterase, UA: NEGATIVE
Leukocyte Esterase, UA: NEGATIVE
Nitrite, UA: NEGATIVE
Nitrite, UA: NEGATIVE
Protein, UR: NEGATIVE
Protein, UR: NEGATIVE
Specific Gravity UA: 1.019 (ref 1.001–1.035)
Specific Gravity UA: 1.018 (ref 1.001–1.035)
Urine pH: 5 (ref 5.0–8.0)
Urine pH: 5 (ref 5.0–8.0)
Urobilinogen, UA: NORMAL mg/dL (ref 0.2–2.0)
Urobilinogen, UA: NORMAL mg/dL (ref 0.2–2.0)

## 2018-10-27 LAB — BASIC METABOLIC PANEL
Anion Gap: 12 (ref 5.0–15.0)
BUN: 23 mg/dL (ref 9.0–28.0)
CO2: 21 mEq/L — ABNORMAL LOW (ref 22–29)
Calcium: 8.9 mg/dL (ref 7.9–10.2)
Chloride: 104 mEq/L (ref 100–111)
Creatinine: 1.1 mg/dL (ref 0.7–1.3)
Glucose: 106 mg/dL — ABNORMAL HIGH (ref 70–100)
Potassium: 4.4 mEq/L (ref 3.5–5.1)
Sodium: 137 mEq/L (ref 136–145)

## 2018-10-27 LAB — PT AND APTT
PT INR: 0.9 (ref 0.9–1.1)
PT: 12.5 s — ABNORMAL LOW (ref 12.6–15.0)
PTT: 30 s (ref 23–37)

## 2018-10-27 LAB — GFR: EGFR: >60

## 2018-10-27 LAB — HEPATIC FUNCTION PANEL
ALT: 15 U/L (ref 0–55)
AST (SGOT): 15 U/L (ref 5–34)
Albumin/Globulin Ratio: 1.4 (ref 0.9–2.2)
Albumin: 3.8 g/dL (ref 3.5–5.0)
Alkaline Phosphatase: 132 U/L — ABNORMAL HIGH (ref 38–106)
Bilirubin Direct: 0.2 mg/dL (ref 0.0–0.5)
Bilirubin Indirect: 0.1 mg/dL — ABNORMAL LOW (ref 0.2–1.0)
Bilirubin, Total: 0.3 mg/dL (ref 0.2–1.2)
Globulin: 2.7 g/dL (ref 2.0–3.6)
Protein, Total: 6.5 g/dL (ref 6.0–8.3)

## 2018-10-27 MED ORDER — GABAPENTIN 50 MG/ML UNIT DOSE
800.00 mg | Freq: Once | ORAL | Status: AC
Start: 2018-10-27 — End: 2018-10-27
  Administered 2018-10-27: 22:00:00 800 mg via ORAL
  Filled 2018-10-27: qty 18

## 2018-10-27 NOTE — ED Provider Notes (Signed)
8:20 PM  I have briefly evaluated this patient as triage physician in order to facilitate and initiate the ordering of laboratory and imaging studies as needed.       Rudi Rummage, MD  10/27/18 2020

## 2018-10-27 NOTE — ED Triage Notes (Signed)
Pt ambulatory to triage with c/o R sided rib/flank pain for 2 weeks. Pt denies urinary difficulty, states his urine has been "dark" for several days. NAD, RR unlabored. A&Ox4

## 2018-10-27 NOTE — Discharge Instructions (Signed)
Dear Mr. Vanessen:    Thank you for choosing the [facility] Emergency Department, the premier emergency department in the [place] area.  I hope your visit today was EXCELLENT. You will receive a survey via text message that will give you the opportunity to provide feedback to your team about your visit. Please do not hesitate to reach out with any questions!      IF YOU DO NOT CONTINUE TO IMPROVE OR YOUR CONDITION WORSENS, PLEASE CONTACT YOUR DOCTOR OR RETURN IMMEDIATELY TO THE EMERGENCY DEPARTMENT.    Sincerely,  Nedra Hai Thereasa Parkin, MD  Attending Emergency Physician  [facility] Emergency Department    ONSITE PHARMACY  Our full service onsite pharmacy is located in the ER waiting room.  Open 7 days a week from 9 am to 9 pm.  We accept all major insurances and prices are competitive with major retailers.  Ask your provider to print your prescriptions down to the pharmacy to speed you on your way home.    OBTAINING A PRIMARY CARE APPOINTMENT    Primary care physicians (PCPs, also known as primary care doctors) are either internists or family medicine doctors. Both types of PCPs focus on health promotion, disease prevention, patient education and counseling, and treatment of acute and chronic medical conditions.    Call for an appointment with a primary care doctor.  Ask to see who is taking new patients.     [facility]  telephone:  [phone]  [email or web address]    DOCTOR REFERRALS  Call [phone] (available 24 hours a day, 7 days a week) if you need any further referrals and we can help you find a primary care doctor or specialist.  Also, available online at:  [email or web address]    YOUR CONTACT INFORMATION  Before leaving please check with registration to make sure we have an up-to-date contact number.  You can call registration at [phone] to update your information.  For questions about your hospital bill, please call [phone].  For questions about your  Emergency Dept Physician bill please call [phone].      FREE HEALTH SERVICES  If you need help with health or social services, please call 2-1-1 for a free referral to resources in your area.  2-1-1 is a free service connecting people with information on health insurance, free clinics, pregnancy, mental health, dental care, food assistance, housing, and substance abuse counseling.  Also, available online at:  http://www.[place].org    MEDICAL RECORDS AND TESTS  Certain laboratory test results do not come back the same day, for example urine cultures.   We will contact you if other important findings are noted.  Radiology films are often reviewed again to ensure accuracy.  If there is any discrepancy, we will notify you.      Please call [phone] to pick up a complimentary CD of any radiology studies performed.  If you or your doctor would like to request a copy of your medical records, please call [phone].      ORTHOPEDIC INJURY   Please know that significant injuries can exist even when an initial x-ray is read as normal or negative.  This can occur because some fractures (broken bones) are not initially visible on x-rays.  For this reason, close outpatient follow-up with your primary care doctor or bone specialist (orthopedist) is required.    MEDICATIONS AND FOLLOWUP  Please be aware that some prescription medications can cause drowsiness.  Use caution when driving or operating machinery.  The examination and treatment you have received in our Emergency Department is provided on an emergency basis, and is not intended to be a substitute for your primary care physician.  It is important that your doctor checks you again and that you report any new or remaining problems at that time.      [place]  The nearest 24 hour pharmacy is:    [facility] [place], [place] [address]  [place]   [facility])  Call to start or finish an application, compare plans, enroll or ask a question.  [place]: [phone]  Web:  Healthcare.gov    Help Enrolling in [place]  [phone] (TOLL-FREE)  [phone] (TTY)  Web:  Http://www.coverva.org    Local Help Enrolling in the [place]  [phone] (MAIN)  Email:  health-help_0 .org  Web:  http://lewis-perez.info/  Address:  [address] [place], [place] [address]    SEDATING MEDICATIONS  Sedating medications include strong pain medications (e.g. narcotics), muscle relaxers, benzodiazepines (used for anxiety and as muscle relaxers), Benadryl/diphenhydramine and other antihistamines for allergic reactions/itching, and other medications.  If you are unsure if you have received a sedating medication, please ask your physician or nurse.  If you received a sedating medication: DO NOT drive a car. DO NOT operate machinery. DO NOT perform jobs where you need to be alert.  DO NOT drink alcoholic beverages while taking this medicine.     If you get dizzy, sit or lie down at the first signs. Be careful going up and down stairs.  Be extra careful to prevent falls.     Never give this medicine to others.     Keep this medicine out of reach of children.     Do not take or save old medicines. Throw them away when outdated.     Keep all medicines in a cool, dry place. DO NOT keep them in your bathroom medicine cabinet or in a cabinet above the stove.    MEDICATION REFILLS  Please be aware that we cannot refill any prescriptions through the ER. If you need further treatment from what is provided at your ER visit, please follow up with your primary care doctor or your pain management specialist.    [place]  Did you know [facility] has two freestanding ERs located just a few miles away?  [facility] [place] have short wait times,  easy free parking directly in front of the building and top patient satisfaction scores - and the same Board Certified Emergency Medicine doctors as [facility].

## 2018-10-27 NOTE — ED Triage Notes (Signed)
p/w R sided rib pain/flank pain x 2 weeks. Pt states he was seen at different ED and was told he has "kidney problems". Reports dark colored urine.

## 2018-10-27 NOTE — ED Provider Notes (Signed)
[facility] EMERGENCY DEPARTMENT H&P      Visit date: 10/27/2018      CLINICAL SUMMARY           Diagnosis:    .     Final diagnoses:   Flank pain   Renal cyst         MDM Notes:      73yo M with flank pain, possible hematuria though no hematuria now. CT with no kidney stones, + renal cyst. Consider intermittent bleeding from renal cyst vs loin pain hematuria syndrome, though low suspicion of either as would be atypical presentation/demographic. While MSK would be reasonable etiology, would not explain change in urine color. Doubt liver problem/hyperbili.  Regardless, HD stable, doubt active infection, pain improved without intervention. Appropriate for outpt followup.          Disposition:         Discharge        Discharge Prescriptions     None                         CLINICAL INFORMATION        HPI:      Chief Complaint: Flank Pain  .    [place] Lundy Cozart. is a 73 y.o. male history of kidney stones, 10 days of right flank pain.  Intermittently dark urine, not frank bright red blood.  No fevers.  No trauma.  Has lost 30 pounds in the past 9 months. Had become homeless a few months ago, thought was related to his diet.  Also patient is diabetic and has neuropathy in his feet, normally takes 800 of gabapentin 3 times a day, ran out a week ago.    History obtained from: Patient          ROS:      Positive and negative ROS elements as per HPI.  All other systems reviewed and negative.      Physical Exam:      Pulse 70  BP (!) 184/96(hx HTN, out of meds)  Resp 16  SpO2 99 %  Temp 97.4 F (36.3 C)    Physical Exam   Constitutional: In No acute distress. Vital signs are noted.   HEENT:        Head: Atraumatic.        Nose: No epistaxis.        Ears:       Mouth/Throat:    OP Clear.    Mucous membranes moist       Eyes: EOM intact. PERRL.  Cardiovascular:         Regular rate       Regular rhythm       No M/G/R.   Pulmonary/Chest:        No accessory muscle use       No rales        No wheezing.  Right lower lateral chest tenderness to palpation.  Abdominal:        There are no masses       Right CVA/right flank tenderness to palpation.       There is no rebound.   Musculoskeletal:        Cervical Neck: No TTP. Supple, good ROM.                   Thoracic back: No TTP       Lumbar back: No TTP  Arms: No edema or TTP.        Legs: No edema or TTP.   Neurological: Alert and oriented. Pt has normal motor function. No sensory deficits.   Skin: No rash. No pallor.   Psych: No SI/HI/Hallucinations.                PAST HISTORY        Primary Care Provider: Pcp, None, MD        PMH/PSH:    .     Past Medical History:   Diagnosis Date   . Hyperlipidemia    . Hypertension    . Irregular heart beat    . Restless leg        He has a past surgical history that includes Back surgery and Spinal cord stimulator implant.      Social/Family History:      He reports that he has been smoking cigarettes. He has been smoking about 1.00 pack per day. He has never used smokeless tobacco. He reports current alcohol use. No history on file for drug.    History reviewed. No pertinent family history.      Listed Medications on Arrival:    .     Home Medications     Med List Status: In Progress Set By: Glean Salvo, RN at 10/27/2018 10:12 PM                amLODIPine-atorvastatin (CADUET) 5-10 MG per tablet     Take 1 tablet by mouth daily     aspirin EC 81 MG EC tablet     Take 81 mg by mouth daily     atorvastatin (LIPITOR) 20 MG tablet     Take 20 mg by mouth daily     FLUoxetine (PROzac) 40 MG capsule     Take 40 mg by mouth daily     gabapentin (NEURONTIN) 800 MG tablet     Take 800 mg by mouth 3 (three) times daily     glipiZIDE (GLUCOTROL) 5 MG tablet     Take 5 mg by mouth 2 (two) times daily before meals     lisinopril (ZESTRIL) 20 MG tablet     Take 20 mg by mouth daily     metFORMIN (GLUCOPHAGE) 1000 MG tablet     Take 1,000 mg by mouth 2 (two) times daily with meals     methocarbamol (ROBAXIN) 500 MG  tablet     Take 500 mg by mouth 4 (four) times daily     metoprolol tartrate (LOPRESSOR) 25 MG tablet     Take 25 mg by mouth 2 (two) times daily     omeprazole (PriLOSEC) 40 MG capsule     Take 40 mg by mouth daily     raNITIdine HCl (RANITIDINE 150 MAX STRENGTH PO)     Take by mouth     tamsulosin (FLOMAX) 0.4 MG Cap     Take 0.4 mg by mouth Daily after dinner     traZODone (DESYREL) 100 MG tablet     Take 100 mg by mouth nightly         Allergies: He is allergic to strawberry c [ascorbate].            VISIT INFORMATION        Clinical Course in the ED:      Update- imaging and labs encouraging. Patient feeling better. No new complaints.  Medications Given in the ED:    .     ED Medication Orders (From admission, onward)    Start Ordered     Status Ordering Provider    10/27/18 2128 10/27/18 2127  gabapentin (NEURONTIN) 50 mg/mL oral solution 800 mg  Once     Route: Oral  Ordered Dose: 800 mg     Last MAR action: Given Marrisa Kimber KIM            Procedures:      Procedures      Interpretations:      DDx/Discussion --kidney stone, renal malignancy, liver abnormality, musculoskeletal. Would be abnormal demographic for loin pain-hematuria synd. low suspicion intrathoracic process.  Check labs, imaging.  Patient does not want medication other than his usual gabapentin.    Pulse Oximetry Analysis -  Normal - SpO2 99 % on RA    Plain Film Xray Interpretation --  Study and Reading: Chest xray - No acute disease  These studies were interpreted by the ED Physician, France Ravens, MD.      *This note was generated by the Epic / Dragon speech recognition system and may contain errors or omissions not intended by the user. Grammatical errors, random word insertions, deletions, pronoun errors and incomplete sentences are occasional consequences of this technology due to software limitations. Not all errors are caught or corrected. If there are questions or concerns about the content of this note or information contained  within the body of this dictation they should be addressed directly with the author for clarification.*                RESULTS        Lab Results:      Wbc 11  Hgb 13  Plt 291    Results     Procedure Component Value Units Date/Time    UA Reflex to Micro - Reflex to Culture [[id number]] Collected: 10/27/18 2212    Specimen: Urine, Clean Catch Updated: 10/27/18 2255     Urine Type Urine, Clean Ca     Color, UA Yellow     Clarity, UA Clear     Specific Gravity UA 1.018     Urine pH 5.0     Leukocyte Esterase, UA Negative     Nitrite, UA Negative     Protein, UR Negative     Glucose, UA Negative     Ketones UA Negative     Urobilinogen, UA Normal mg/dL      Bilirubin, UA Negative     Blood, UA Negative    Narrative:      Replace urinary catheter prior to obtaining the urine culture  if it has been in place for greater than or equal to 14  days:->N/A No Foley  Indications for U/A Reflex to Micro - Reflex to  Culture:->Suprapubic Pain/Tenderness or Dysuria    PT/APTT [[id number]]  (Abnormal) Collected: 10/27/18 2212     Updated: 10/27/18 2249     PT 12.5 sec      PT INR 0.9     PTT 30 sec     Narrative:      Replace urinary catheter prior to obtaining the urine culture  if it has been in place for greater than or equal to 14  days:->N/A No Foley  Indications for U/A Reflex to Micro - Reflex to  Culture:->Suprapubic Pain/Tenderness or Dysuria    GFR [[id number]] Collected: 10/27/18 2212     Updated: 10/27/18 2246  EGFR >60.0    Narrative:      Replace urinary catheter prior to obtaining the urine culture  if it has been in place for greater than or equal to 14  days:->N/A No Foley  Indications for U/A Reflex to Micro - Reflex to  Culture:->Suprapubic Pain/Tenderness or Dysuria    Basic Metabolic Panel [[id number]]  (Abnormal) Collected: 10/27/18 2212    Specimen: Blood Updated: 10/27/18 2246     Glucose 106 mg/dL      BUN 10.2 mg/dL      Creatinine 1.1 mg/dL      Calcium 8.9 mg/dL      Sodium 725 mEq/L      Potassium 4.4  mEq/L      Chloride 104 mEq/L      CO2 21 mEq/L      Anion Gap 12.0    Narrative:      Replace urinary catheter prior to obtaining the urine culture  if it has been in place for greater than or equal to 14  days:->N/A No Foley  Indications for U/A Reflex to Micro - Reflex to  Culture:->Suprapubic Pain/Tenderness or Dysuria    Hepatic function panel (LFT) [[id number]]  (Abnormal) Collected: 10/27/18 2212    Specimen: Blood Updated: 10/27/18 2246     Bilirubin, Total 0.3 mg/dL      Bilirubin Direct 0.2 mg/dL      Bilirubin Indirect 0.1 mg/dL      AST (SGOT) 15 U/L      ALT 15 U/L      Alkaline Phosphatase 132 U/L      Protein, Total 6.5 g/dL      Albumin 3.8 g/dL      Globulin 2.7 g/dL      Albumin/Globulin Ratio 1.4    Narrative:      Replace urinary catheter prior to obtaining the urine culture  if it has been in place for greater than or equal to 14  days:->N/A No Foley  Indications for U/A Reflex to Micro - Reflex to  Culture:->Suprapubic Pain/Tenderness or Dysuria    Urinalysis Reflex to Microscopic Exam- Reflex to Culture [[id number]] Collected: 10/27/18 2044    Specimen: Urine, Clean Catch Updated: 10/27/18 2137     Urine Type Urine, Clean Ca     Color, UA Yellow     Clarity, UA Clear     Specific Gravity UA 1.019     Urine pH 5.0     Leukocyte Esterase, UA Negative     Nitrite, UA Negative     Protein, UR Negative     Glucose, UA Negative     Ketones UA Negative     Urobilinogen, UA Normal mg/dL      Bilirubin, UA Negative     Blood, UA Negative    Narrative:      Replace urinary catheter prior to obtaining the urine culture  if it has been in place for greater than or equal to 14  days:->N/A No Foley  Indications for U/A Reflex to Micro - Reflex to  Culture:->Other (please specify in Comments)              Radiology Results:      Chest AP Portable   Final Result    Radiographic examination of the chest shows no definite   acute abnormality. The lungs are well-expanded and clear. The heart is   top normal in  size. A neurostimulator device is noted.      Miguel Dibble, MD  10/27/2018 11:30 PM      CT Abd/Pelvis without Contrast   Final Result         1. No ureteral or bladder stones. There is a 1 mm nonobstructing stone   in the right kidney.      2. Prostatomegaly. No bladder dilatation.      3. There is a 3.1 cm simple fluid attenuation lesion in the upper pole   of the right kidney which is suboptimally characterized without contrast   and could be better assessed with ultrasound as clinically indicated.      4. Benign lipid rich right adrenal adenoma measuring 1.1 cm.      5. Sigmoid diverticulosis.      6. Postoperative changes related to L4-S1 laminectomies with posterior   spinal fusion. Partially visualized thoracic spinal stimulator.      Eloise Harman, MD    10/27/2018 11:23 PM                  Scribe Attestation:      No scribe involved in the care of this patient          Criss Alvine, MD  10/28/18 1038

## 2018-10-28 ENCOUNTER — Other Ambulatory Visit: Payer: Self-pay

## 2018-10-28 ENCOUNTER — Ambulatory Visit (HOSPITAL_COMMUNITY)
Admission: RE | Admit: 2018-10-28 | Discharge: 2018-10-28 | Disposition: A | Discharge status: Home / Self Care | Payer: Medicare Other | Source: Ambulatory Visit | Attending: Hematology & Oncology | Admitting: Hematology & Oncology

## 2018-10-28 DIAGNOSIS — C7800 Secondary malignant neoplasm of unspecified lung: Secondary | ICD-10-CM | POA: Diagnosis not present

## 2018-10-28 DIAGNOSIS — C7A8 Other malignant neuroendocrine tumors: Secondary | ICD-10-CM | POA: Diagnosis not present

## 2018-10-28 LAB — CBC AND DIFFERENTIAL
Absolute NRBC: 0 10*3/uL (ref 0.00–0.00)
Basophils Absolute Automated: 0.09 10*3/uL — ABNORMAL HIGH (ref 0.00–0.08)
Basophils Automated: 0.8 %
Eosinophils Absolute Automated: 0.2 10*3/uL (ref 0.00–0.44)
Eosinophils Automated: 1.8 %
Hematocrit: 41.7 % (ref 37.6–49.6)
Hgb: 13.3 g/dL (ref 12.5–17.1)
Immature Granulocytes Absolute: 0.03 10*3/uL (ref 0.00–0.07)
Immature Granulocytes: 0.3 %
Lymphocytes Absolute Automated: 5.36 10*3/uL — ABNORMAL HIGH (ref 0.42–3.22)
Lymphocytes Automated: 48.1 %
MCH: 29.2 pg (ref 25.1–33.5)
MCHC: 31.9 g/dL (ref 31.5–35.8)
MCV: 91.6 fL (ref 78.0–96.0)
MPV: 9.7 fL (ref 8.9–12.5)
Monocytes Absolute Automated: 0.65 10*3/uL (ref 0.21–0.85)
Monocytes: 5.8 %
Neutrophils Absolute: 4.82 10*3/uL (ref 1.10–6.33)
Neutrophils: 43.2 %
Nucleated RBC: 0 /100 WBC (ref 0.0–0.0)
Platelets: 291 10*3/uL (ref 142–346)
RBC: 4.55 10*6/uL (ref 4.20–5.90)
RDW: 13 % (ref 11–15)
WBC: 11.15 10*3/uL — ABNORMAL HIGH (ref 3.10–9.50)

## 2018-10-28 MED ORDER — GALLIUM GA 68 DOTATATE IV KIT: 4.0000 | PACK | Freq: Once | INTRAVENOUS | Status: DC | PRN

## 2018-10-29 ENCOUNTER — Inpatient Hospital Stay: Payer: Medicare Other | Attending: Hematology & Oncology | Admitting: Hematology & Oncology

## 2018-10-29 ENCOUNTER — Inpatient Hospital Stay: Payer: Medicare Other

## 2018-10-29 ENCOUNTER — Encounter: Payer: Self-pay | Admitting: Hematology & Oncology

## 2018-10-29 VITALS — BP 119/64 | HR 90 | Temp 97.1°F | Resp 18 | Ht 68.0 in | Wt 153.1 lb

## 2018-10-29 DIAGNOSIS — C7B8 Other secondary neuroendocrine tumors: Secondary | ICD-10-CM | POA: Insufficient documentation

## 2018-10-29 DIAGNOSIS — R918 Other nonspecific abnormal finding of lung field: Secondary | ICD-10-CM | POA: Diagnosis not present

## 2018-10-29 DIAGNOSIS — C61 Malignant neoplasm of prostate: Secondary | ICD-10-CM | POA: Diagnosis not present

## 2018-10-29 DIAGNOSIS — Z5189 Encounter for other specified aftercare: Secondary | ICD-10-CM | POA: Insufficient documentation

## 2018-10-29 DIAGNOSIS — C7A8 Other malignant neuroendocrine tumors: Secondary | ICD-10-CM | POA: Diagnosis not present

## 2018-10-29 DIAGNOSIS — M2550 Pain in unspecified joint: Secondary | ICD-10-CM | POA: Diagnosis not present

## 2018-10-29 DIAGNOSIS — Z5112 Encounter for antineoplastic immunotherapy: Secondary | ICD-10-CM | POA: Insufficient documentation

## 2018-10-29 DIAGNOSIS — R531 Weakness: Secondary | ICD-10-CM | POA: Insufficient documentation

## 2018-10-29 DIAGNOSIS — Z885 Allergy status to narcotic agent status: Secondary | ICD-10-CM | POA: Diagnosis not present

## 2018-10-29 DIAGNOSIS — C7951 Secondary malignant neoplasm of bone: Secondary | ICD-10-CM

## 2018-10-29 DIAGNOSIS — Z79899 Other long term (current) drug therapy: Secondary | ICD-10-CM | POA: Diagnosis not present

## 2018-10-29 DIAGNOSIS — Z5111 Encounter for antineoplastic chemotherapy: Secondary | ICD-10-CM | POA: Diagnosis not present

## 2018-10-29 LAB — CBC WITH DIFFERENTIAL (CANCER CENTER ONLY)
Abs Immature Granulocytes: 0.06 10*3/uL (ref 0.00–0.07)
Basophils Absolute: 0.1 10*3/uL (ref 0.0–0.1)
Basophils Relative: 1 %
Eosinophils Absolute: 0.1 10*3/uL (ref 0.0–0.5)
Eosinophils Relative: 1 %
HCT: 35.3 % — ABNORMAL LOW (ref 39.0–52.0)
Hemoglobin: 11.2 g/dL — ABNORMAL LOW (ref 13.0–17.0)
Immature Granulocytes: 1 %
Lymphocytes Relative: 11 %
Lymphs Abs: 1 10*3/uL (ref 0.7–4.0)
MCH: 31.5 pg (ref 26.0–34.0)
MCHC: 31.7 g/dL (ref 30.0–36.0)
MCV: 99.4 fL (ref 80.0–100.0)
Monocytes Absolute: 0.7 10*3/uL (ref 0.1–1.0)
Monocytes Relative: 8 %
Neutro Abs: 7.2 10*3/uL (ref 1.7–7.7)
Neutrophils Relative %: 78 %
Platelet Count: 349 10*3/uL (ref 150–400)
RBC: 3.55 MIL/uL — ABNORMAL LOW (ref 4.22–5.81)
RDW: 15.9 % — ABNORMAL HIGH (ref 11.5–15.5)
WBC Count: 9.1 10*3/uL (ref 4.0–10.5)
nRBC: 0 % (ref 0.0–0.2)

## 2018-10-29 LAB — CMP (CANCER CENTER ONLY)
ALT: 11 U/L (ref 0–44)
AST: 12 U/L — ABNORMAL LOW (ref 15–41)
Albumin: 4.3 g/dL (ref 3.5–5.0)
Alkaline Phosphatase: 61 U/L (ref 38–126)
Anion gap: 9 (ref 5–15)
BUN: 22 mg/dL (ref 8–23)
CO2: 26 mmol/L (ref 22–32)
Calcium: 9.4 mg/dL (ref 8.9–10.3)
Chloride: 103 mmol/L (ref 98–111)
Creatinine: 1.23 mg/dL (ref 0.61–1.24)
GFR, Est AFR Am: >60 mL/min (ref >60)
GFR, Estimated: 59 mL/min — ABNORMAL LOW (ref >60)
Glucose, Bld: 101 mg/dL — ABNORMAL HIGH (ref 70–99)
Potassium: 4.2 mmol/L (ref 3.5–5.1)
Sodium: 138 mmol/L (ref 135–145)
Total Bilirubin: 0.3 mg/dL (ref 0.3–1.2)
Total Protein: 6.8 g/dL (ref 6.5–8.1)

## 2018-10-29 LAB — LACTATE DEHYDROGENASE: LDH: 184 U/L (ref 98–192)

## 2018-10-29 MED ORDER — LEUPROLIDE ACETATE (3 MONTH) 22.5 MG ~~LOC~~ KIT
22.5000 mg | PACK | Freq: Once | SUBCUTANEOUS | Status: AC
  Administered 2018-10-29: 22.5 mg via SUBCUTANEOUS
  Filled 2018-10-29: qty 22.5

## 2018-10-29 MED ORDER — SODIUM CHLORIDE 0.9 % IV SOLN
Freq: Once | INTRAVENOUS | Status: AC
  Administered 2018-10-29: 11:00:00 via INTRAVENOUS
  Filled 2018-10-29: qty 250

## 2018-10-29 MED ORDER — PEGFILGRASTIM 6 MG/0.6ML ~~LOC~~ PSKT
PREFILLED_SYRINGE | SUBCUTANEOUS | Status: AC
  Filled 2018-10-29: qty 0.6

## 2018-10-29 MED ORDER — PROCHLORPERAZINE MALEATE 10 MG PO TABS
10.0000 mg | ORAL_TABLET | Freq: Once | ORAL | Status: AC
  Administered 2018-10-29: 11:00:00 10 mg via ORAL

## 2018-10-29 MED ORDER — DEXAMETHASONE SODIUM PHOSPHATE 10 MG/ML IJ SOLN
INTRAMUSCULAR | Status: AC
  Filled 2018-10-29: qty 1

## 2018-10-29 MED ORDER — DEXAMETHASONE SODIUM PHOSPHATE 10 MG/ML IJ SOLN
10.0000 mg | Freq: Once | INTRAMUSCULAR | Status: AC
  Administered 2018-10-29: 10 mg via INTRAVENOUS

## 2018-10-29 MED ORDER — SODIUM CHLORIDE 0.9 % IV SOLN
200.0000 mg | Freq: Once | INTRAVENOUS | Status: AC
  Administered 2018-10-29: 200 mg via INTRAVENOUS
  Filled 2018-10-29: qty 8

## 2018-10-29 MED ORDER — SODIUM CHLORIDE 0.9 % IV SOLN
80.0000 mg/m2 | Freq: Once | INTRAVENOUS | Status: AC
  Administered 2018-10-29: 13:00:00 150 mg via INTRAVENOUS
  Filled 2018-10-29: qty 15

## 2018-10-29 MED ORDER — DENOSUMAB 120 MG/1.7ML ~~LOC~~ SOLN
SUBCUTANEOUS | Status: AC
  Filled 2018-10-29: qty 1.7

## 2018-10-29 MED ORDER — PANTOPRAZOLE SODIUM 40 MG PO TBEC: 40.0000 mg | DELAYED_RELEASE_TABLET | Freq: Every day | ORAL | 6 refills | Status: DC

## 2018-10-29 MED ORDER — PEGFILGRASTIM 6 MG/0.6ML ~~LOC~~ PSKT
6.0000 mg | PREFILLED_SYRINGE | Freq: Once | SUBCUTANEOUS | Status: AC
  Administered 2018-10-29: 14:00:00 6 mg via SUBCUTANEOUS

## 2018-10-29 MED ORDER — SODIUM CHLORIDE 0.9% FLUSH
10.0000 mL | INTRAVENOUS | Status: DC | PRN
  Administered 2018-10-29: 14:00:00 10 mL
  Filled 2018-10-29: qty 10

## 2018-10-29 MED ORDER — HEPARIN SOD (PORK) LOCK FLUSH 100 UNIT/ML IV SOLN
500.0000 [IU] | Freq: Once | INTRAVENOUS | Status: AC | PRN
  Administered 2018-10-29: 14:00:00 500 [IU]
  Filled 2018-10-29: qty 5

## 2018-10-29 MED ORDER — SODIUM CHLORIDE 0.9% FLUSH
10.0000 mL | Freq: Once | INTRAVENOUS | Status: AC
  Administered 2018-10-29: 10 mL
  Filled 2018-10-29: qty 10

## 2018-10-29 MED ORDER — AZITHROMYCIN 500 MG PO TABS: 500.0000 mg | ORAL_TABLET | Freq: Every day | ORAL | 0 refills | Status: DC

## 2018-10-29 MED ORDER — PROCHLORPERAZINE MALEATE 10 MG PO TABS
ORAL_TABLET | ORAL | Status: AC
  Filled 2018-10-29: qty 1

## 2018-10-29 MED ORDER — DENOSUMAB 120 MG/1.7ML ~~LOC~~ SOLN
120.0000 mg | Freq: Once | SUBCUTANEOUS | Status: AC
  Administered 2018-10-29: 12:00:00 120 mg via SUBCUTANEOUS

## 2018-10-29 MED FILL — DEXAMETHASONE 4 MG TABLET: 4 | 15 days supply | Qty: 60 | Fill #2

## 2018-10-29 MED FILL — PANTOPRAZOLE SOD DR 40 MG T: 40 | 30 days supply | Qty: 30 | Fill #0

## 2018-10-29 MED FILL — AZITHROMYCIN 500 MG TABS: 500 | 7 days supply | Qty: 7 | Fill #0

## 2018-10-29 MED FILL — SOLIFENACIN SUCCINATE 5 MG: 5 | 30 days supply | Qty: 30 | Fill #3

## 2018-10-29 NOTE — Patient Instructions (Signed)

## 2018-10-29 NOTE — Addendum Note (Signed)
Addended by: Burney Gauze R on: 10/29/2018 03:06 PM   Modules accepted: Orders

## 2018-10-29 NOTE — Patient Instructions (Signed)
[facility] Discharge Instructions for Patients Receiving Chemotherapy  Today you received the following chemotherapy agents Keytruda//Taxotere To help prevent nausea and vomiting after your treatment, we encourage you to take your nausea medication as prescribed.   If you develop nausea and vomiting that is not controlled by your nausea medication, call the clinic.   BELOW ARE SYMPTOMS THAT SHOULD BE REPORTED IMMEDIATELY:  *FEVER GREATER THAN 100.5 F  *CHILLS WITH OR WITHOUT FEVER  NAUSEA AND VOMITING THAT IS NOT CONTROLLED WITH YOUR NAUSEA MEDICATION  *UNUSUAL SHORTNESS OF BREATH  *UNUSUAL BRUISING OR BLEEDING  TENDERNESS IN MOUTH AND THROAT WITH OR WITHOUT PRESENCE OF ULCERS  *URINARY PROBLEMS  *BOWEL PROBLEMS  UNUSUAL RASH Items with * indicate a potential emergency and should be followed up as soon as possible.  Feel free to call the clinic should you have any questions or concerns. The clinic phone number is [phone].  Please show the [place] at check-in to the Emergency Department and triage nurse.

## 2018-10-29 NOTE — Progress Notes (Signed)
Hematology and Oncology Follow Up Visit  [name] [id number] 02-07-1945 73 y.o. 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   Current Therapy:    Zytiga 1000 mg by mouth daily - discontinued on 08/15/2016  Xtandi 160 mg po q day - start on 08/15/2016 - discontinued  Xgeva 120 mg subcutaneous Q3 months - due in Oct 2020  Lupron 22 mg IM every 3 months - due in Oct 2020  Radium-223 therapy -- s/p cycle #6  Palliative radiation to the right sacrum  Lutathera - s/p cycle 4/4 --last dose on 07/11/2017  Carbo/VP-16/Tecentriq --  S/p cycle #4  Keytruda q 6 week -- Maintanence -- s/p cycle #3 - changed from 3 to 6 week on 06/30/2018  Taxotere/Keytruda -- start on 09/17/2018 -- s/c cycle #2     Interim History:  [name] is back for follow-up.  He looks quite good.  I must say that he has done quite nicely with treatment.  Even more importantly is the fact that he is responded to treatment.  We did a dotatate PET scan on him on 10/28/2018.  This showed a nice response.  He had decreased in size and activity of his pulmonary nodules.  Also noted is decreased in size of the lesion in the central sacrum.  His quality of life seems to be doing okay.  His wife and sister were on the cell phone.  He said that he is a little more unsteady.  I am unsure as to why this would be.  He really does not complain of any issues with being unsteady.  He says he is not fallen.  He is eating okay.  His appetite might be down a little bit the first week of treatment by this picks back up.  He does not want anything to help with his appetite.  He has had no problems with cough.  He has had no nausea or vomiting.  There is been no diarrhea.  Overall, his performance status is ECOG 1.      Medications:  Current Outpatient Medications:  .  acetaminophen (TYLENOL) 500 MG tablet, Take 1,000  mg by mouth every 4 (four) hours as needed for moderate pain., Disp: , Rfl:  .  aspirin 81 MG tablet, Take 81 mg by mouth daily., Disp: , Rfl:  .  CALCIUM-IRON-VIT D-VIT K PO, Take 1 tablet by mouth 2 (two) times daily., Disp: , Rfl:  .  dexamethasone (DECADRON) 4 MG tablet, Take 4 mg by mouth 2 (two) times daily with a meal., Disp: , Rfl:  .  docusate sodium (COLACE) 100 MG capsule, Take 200 mg by mouth 2 (two) times daily., Disp: , Rfl:  .  fluconazole (DIFLUCAN) 100 MG tablet, Take 1 tablet (100 mg total) by mouth daily., Disp: 30 tablet, Rfl: 6 .  gabapentin (NEURONTIN) 300 MG capsule, TAKE 1 CAPSULE BY MOUTH TWICE A DAY THEN TAKE 2 CAPSULES AT BEDTIME (Patient taking differently: Take 300 mg by mouth 2 (two) times daily. ), Disp: 120 capsule, Rfl: 4 .  isosorbide mononitrate (IMDUR) 30 MG 24 hr tablet, Take 30 mg by mouth daily., Disp: , Rfl:  .  ondansetron (ZOFRAN) 8 MG tablet, Take 1 tablet (8 mg total) by mouth 2 (two) times daily as needed for nausea or vomiting., Disp: 20 tablet, Rfl: 0 .  OVER THE COUNTER MEDICATION, every morning. Juice Plus -- 8 capsule  of each the garden, vineyard, and orchard twice a day., Disp: , Rfl:  .  pantoprazole (PROTONIX) 40 MG tablet, Take 1 tablet (40 mg total) by mouth at bedtime., Disp: 30 tablet, Rfl: 2 .  solifenacin (VESICARE) 10 MG tablet, Take 1 tablet (10 mg total) by mouth daily., Disp: 30 tablet, Rfl: 5 .  sucralfate (CARAFATE) 1 GM/10ML suspension, Take 10 mLs (1 g total) by mouth 4 (four) times daily -  with meals and at bedtime., Disp: 420 mL, Rfl: 5 .  ZETIA 10 MG tablet, Take 10 mg by mouth daily. , Disp: , Rfl:  .  isosorbide mononitrate (IMDUR) 30 MG 24 hr tablet, Take 1 tablet (30 mg total) by mouth daily., Disp: 90 tablet, Rfl: 1 .  nitroGLYCERIN (NITROSTAT) 0.4 MG SL tablet, Place 1 tablet (0.4 mg total) under the tongue every 5 (five) minutes as needed for chest pain., Disp: 25 tablet, Rfl: 11 No current facility-administered medications  for this visit.   Facility-Administered Medications Ordered in Other Visits:  .  DOCEtaxel (TAXOTERE) 150 mg in sodium chloride 0.9 % 250 mL chemo infusion, 80 mg/m2 (Treatment Plan Recorded), Intravenous, Once, [name], MD, Last Rate: 265 mL/hr at 10/29/18 1248, 150 mg at 10/29/18 1248 .  Gallium Ga 68 Dotatate (NETSPOT) KIT 4 millicurie, 4 millicurie, Intravenous, Once PRN, Abigail Miyamoto, MD .  heparin lock flush 100 unit/mL, 500 Units, Intracatheter, Once PRN, Editha Bridgeforth, Rudell Cobb, MD .  Leuprolide Acetate (3 Month) (ELIGARD) 22.5 MG injection 22.5 mg, 22.5 mg, Subcutaneous, Once, Myrth Dahan, Rudell Cobb, MD .  octreotide (SANDOSTATIN LAR) IM injection 30 mg, 30 mg, Intramuscular, Once, Jacquese Hackman R, MD .  pegfilgrastim (NEULASTA ONPRO KIT) injection 6 mg, 6 mg, Subcutaneous, Once, Kelcy Baeten R, MD .  sodium chloride flush (NS) 0.9 % injection 10 mL, 10 mL, Intracatheter, PRN, [name], MD  Allergies:  Allergies  Allergen Reactions  . Codeine Nausea And Vomiting  . Hydrocodone Nausea And Vomiting    Past Medical History, Surgical history, Social history, and Family History were reviewed and updated.  Review of Systems: Review of Systems  Constitutional: Negative.   HENT: Negative.   Eyes: Negative.   Respiratory: Negative.   Cardiovascular: Negative.   Gastrointestinal: Negative.   Genitourinary: Negative.  Negative for urgency.  Musculoskeletal: Positive for joint pain.  Skin: Negative.   Neurological: Positive for focal weakness.  Endo/Heme/Allergies: Negative.   Psychiatric/Behavioral: Negative.   All other systems reviewed and are negative.    Physical Exam:  height is '5\' 8"'$  (1.727 m) and weight is 153 lb 1.9 oz (69.5 kg). His temporal temperature is 97.1 F (36.2 C) (abnormal). His blood pressure is 119/64 and his pulse is 90. His respiration is 18 and oxygen saturation is 100%.   Physical Exam Vitals signs reviewed.  HENT:     Head: Normocephalic and  atraumatic.  Eyes:     Pupils: Pupils are equal, round, and reactive to light.  Neck:     Musculoskeletal: Normal range of motion.  Cardiovascular:     Rate and Rhythm: Normal rate and regular rhythm.     Heart sounds: Normal heart sounds.  Pulmonary:     Effort: Pulmonary effort is normal.     Breath sounds: Normal breath sounds.  Abdominal:     General: Bowel sounds are normal.     Palpations: Abdomen is soft.  Musculoskeletal: Normal range of motion.        General: No tenderness or  deformity.  Lymphadenopathy:     Cervical: No cervical adenopathy.  Skin:    General: Skin is warm and dry.     Findings: No erythema or rash.  Neurological:     Mental Status: He is alert and oriented to person, place, and time.  Psychiatric:        Behavior: Behavior normal.        Thought Content: Thought content normal.        Judgment: Judgment normal.      Lab Results  Component Value Date   WBC 9.1 10/29/2018   HGB 11.2 (L) 10/29/2018   HCT 35.3 (L) 10/29/2018   MCV 99.4 10/29/2018   PLT 349 10/29/2018     Chemistry      Component Value Date/Time   NA 138 10/29/2018 0845   NA 144 12/14/2016 1159   NA 140 10/22/2016 1129   K 4.2 10/29/2018 0845   K 3.5 12/14/2016 1159   K 4.3 10/22/2016 1129   CL 103 10/29/2018 0845   CL 103 12/14/2016 1159   CO2 26 10/29/2018 0845   CO2 30 12/14/2016 1159   CO2 27 10/22/2016 1129   BUN 22 10/29/2018 0845   BUN 16 12/14/2016 1159   BUN 14.0 10/22/2016 1129   CREATININE 1.23 10/29/2018 0845   CREATININE 1.3 (H) 12/14/2016 1159   CREATININE 1.1 10/22/2016 1129      Component Value Date/Time   CALCIUM 9.4 10/29/2018 0845   CALCIUM 10.0 12/14/2016 1159   CALCIUM 10.4 10/22/2016 1129   ALKPHOS 61 10/29/2018 0845   ALKPHOS 48 12/14/2016 1159   ALKPHOS 60 10/22/2016 1129   AST 12 (L) 10/29/2018 0845   AST 16 10/22/2016 1129   ALT 11 10/29/2018 0845   ALT 23 12/14/2016 1159   ALT 14 10/22/2016 1129   BILITOT 0.3 10/29/2018 0845    BILITOT 0.31 10/22/2016 1129         Impression and Plan: [name] is 73 year old gentleman with 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  We will go ahead with a third cycle of treatment.  I probably would do a PET scan after his fifth cycle of treatment.  How long we treat him will really depend upon his tolerability and the response.  We will continue him also on immunotherapy.  We do have the new agent for neuroendocrine carcinoma.  This is called lurbectinib.  This seems to be fairly well tolerated.    I will plan to see him back in 3 weeks.    I spent about 30 minutes with him.  Again, his wife and sister on the cell phone.  We went over the PET scan results.  I went over his lab results.  I went over my recommendations for treatment and that we would do his next PET scan after the fifth cycle of treatment.  They all understand and they agree with my recommendations.   [name], MD 10/14/20201:12 PM

## 2018-10-31 LAB — CHROMOGRANIN A: Chromogranin A (ng/mL): 350.2 ng/mL — ABNORMAL HIGH (ref 0.0–101.8)

## 2018-11-03 ENCOUNTER — Ambulatory Visit: Payer: Medicare Other | Admitting: Family

## 2018-11-03 ENCOUNTER — Ambulatory Visit: Payer: Medicare Other

## 2018-11-03 ENCOUNTER — Other Ambulatory Visit: Payer: Medicare Other

## 2018-11-04 ENCOUNTER — Other Ambulatory Visit: Payer: Self-pay

## 2018-11-04 ENCOUNTER — Encounter: Payer: Self-pay | Admitting: Cardiology

## 2018-11-04 ENCOUNTER — Ambulatory Visit (INDEPENDENT_AMBULATORY_CARE_PROVIDER_SITE_OTHER): Payer: Medicare Other | Admitting: Cardiology

## 2018-11-04 VITALS — BP 112/60 | HR 106 | Ht 68.0 in | Wt 153.0 lb

## 2018-11-04 DIAGNOSIS — R072 Precordial pain: Secondary | ICD-10-CM

## 2018-11-04 DIAGNOSIS — C7951 Secondary malignant neoplasm of bone: Secondary | ICD-10-CM | POA: Diagnosis not present

## 2018-11-04 DIAGNOSIS — I10 Essential (primary) hypertension: Secondary | ICD-10-CM | POA: Diagnosis not present

## 2018-11-04 DIAGNOSIS — E785 Hyperlipidemia, unspecified: Secondary | ICD-10-CM | POA: Diagnosis not present

## 2018-11-04 NOTE — Progress Notes (Signed)
Cardiology Office Note:    Date:  11/04/2018   ID:  [name], DOB [birth year], MRN [id number]  PCP:  Nicoletta Dress, MD  Cardiologist:  Jenne Campus, MD    Referring MD: Nicoletta Dress, MD   Chief Complaint  Patient presents with  . Follow-up  Doing well  History of Present Illness:    [name] is a 73 y.o. 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  Also got atypical chest pain, hypertension, dyslipidemia.  Comes today 2 months for follow-up overall doing great he did have chest pain before only when he was taking chemotherapy.  His chemotherapy has been stopped and then changed to different type and now he has no problem taking this therapy.  Describes a 5 that he have to walk some and had some fatigue and tiredness but overall seems to be doing well  Past Medical History:  Diagnosis Date  . Complication of anesthesia   . Constipation    due to pain medication  . 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 Dyspnea    with exertion  . Essential hypertension 07/12/2014  . Goals of care, counseling/discussion 01/29/2018  . High blood sugar   . 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 PONV (postoperative nausea and vomiting)   . 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 Radiation 04/22/14-05/06/14   right sacrum 30 gray  . 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 CIRCUMCISION     when patient was 73 years old  . COLONOSCOPY    . COLONOSCOPY W/ POLYPECTOMY    . ESOPHAGOGASTRODUODENOSCOPY    . HAND SURGERY Left    fracture  . IR IMAGING GUIDED PORT INSERTION  02/10/2018  . LUMBAR LAMINECTOMY/DECOMPRESSION MICRODISCECTOMY Right 09/12/2016   Procedure: LAMINECTOMY AND FORAMINOTOMY RIGHT LUMBAR FIVE- SACRAL ONE, SACRAL ONE- SACRAL TWO DECOMPRESSION OF RIGHT  SACRAL ONE NERVE ROOT;  Surgeon: [name], MD;  Location: [facility];  Service: Neurosurgery;  Laterality: Right;  . ROBOT ASSISTED LAPAROSCOPIC RADICAL PROSTATECTOMY    . TONSILECTOMY/ADENOIDECTOMY WITH MYRINGOTOMY      Current Medications: Current Meds  Medication Sig  . acetaminophen (TYLENOL) 500 MG tablet Take 1,000 mg by mouth every 4 (four) hours as needed for moderate pain.  Marland Kitchen aspirin 81 MG tablet Take 81 mg by mouth daily.  Marland Kitchen azithromycin (ZITHROMAX) 500 MG tablet Take 1 tablet (500 mg total) by mouth daily.  Marland Kitchen CALCIUM-IRON-VIT D-VIT K PO Take 1 tablet by mouth 2 (two) times daily.  Marland Kitchen dexamethasone (DECADRON) 4 MG tablet Take 4 mg by mouth 2 (two) times daily with a meal.  . docusate sodium (COLACE) 100 MG capsule Take 200 mg by mouth 2 (two) times daily.  Marland Kitchen gabapentin (NEURONTIN) 300 MG capsule TAKE 1 CAPSULE BY MOUTH TWICE A DAY THEN TAKE 2 CAPSULES AT BEDTIME (Patient taking differently: Take 300 mg by mouth 2 (two) times daily. )  . isosorbide mononitrate (IMDUR) 30 MG 24 hr tablet Take 1 tablet (30 mg total) by mouth daily.  . nitroGLYCERIN (NITROSTAT) 0.4 MG SL tablet Place 1 tablet (0.4 mg total) under the tongue every 5 (five) minutes as needed for chest pain.  Marland Kitchen ondansetron (ZOFRAN) 8 MG tablet Take 1 tablet (8 mg total) by mouth 2 (two) times daily as needed for nausea  or vomiting.  Marland Kitchen OVER THE COUNTER MEDICATION every morning. Juice Plus -- 8 capsule of each the garden, vineyard, and orchard twice a day.  . pantoprazole (PROTONIX) 40 MG tablet Take 1 tablet (40 mg total) by mouth at bedtime.  . prochlorperazine (COMPAZINE) 10 MG tablet Take 10 mg by mouth every 6 (six) hours as needed (Nausea or vomiting).  . solifenacin (VESICARE) 10 MG tablet Take 1 tablet (10 mg total) by mouth daily.  . sucralfate (CARAFATE) 1 GM/10ML suspension Take 10 mLs (1 g total) by mouth 4 (four) times daily -  with meals and at bedtime.  Marland Kitchen ZETIA 10 MG tablet Take 10 mg by mouth daily.       Allergies:   Codeine and Hydrocodone   Social History   Socioeconomic History  . Marital status: Married    Spouse name: Not on file  . Number of children: 2  . Years of education: Not on file  . Highest education level: Not on file  Occupational History  . Occupation: real estate aprais.    Employer: [employer]  Social Needs  . Financial resource strain: Not on file  . Food insecurity    Worry: Not on file    Inability: Not on file  . Transportation needs    Medical: Not on file    Non-medical: Not on file  Tobacco Use  . Smoking status: Never Smoker  . Smokeless tobacco: Never Used  . Tobacco comment: never used tobacco  Substance and Sexual Activity  . Alcohol use: No    Alcohol/week: 0.0 standard drinks  . Drug use: No  . Sexual activity: Not on file  Lifestyle  . Physical activity    Days per week: Not on file    Minutes per session: Not on file  . Stress: Not on file  Relationships  . Social Herbalist on phone: Not on file    Gets together: Not on file    Attends religious service: Not on file    Active member of club or organization: Not on file    Attends meetings of clubs or organizations: Not on file    Relationship status: Not on file  Other Topics Concern  . Not on file  Social History Narrative  . Not on file     Family History: The patient's family history includes Heart disease in his father and mother; Prostate cancer in his father. ROS:   Please see the history of present illness.    All 14 point review of systems negative except as described per history of present illness  EKGs/Labs/Other Studies Reviewed:      Recent Labs: 10/08/2018: TSH 0.633 10/29/2018: ALT 11; BUN 22; Creatinine 1.23; Hemoglobin 11.2; Platelet Count 349; Potassium 4.2; Sodium 138  Recent Lipid Panel    Component Value Date/Time   CHOL 230 (H) 03/23/2018 1847   TRIG 209 (H) 03/23/2018 1847   HDL 46 03/23/2018 1847   CHOLHDL 5.0  03/23/2018 1847   VLDL 42 (H) 03/23/2018 1847   LDLCALC 142 (H) 03/23/2018 1847    Physical Exam:    VS:  BP 112/60   Pulse (!) 106   Ht 5\' 8"  (1.727 m)   Wt 153 lb (69.4 kg)   SpO2 98%   BMI 23.26 kg/m     Wt Readings from Last 3 Encounters:  11/04/18 153 lb (69.4 kg)  10/29/18 153 lb 1.9 oz (69.5 kg)  10/08/18 154 lb 12.8 oz (  70.2 kg)     GEN:  Well nourished, well developed in no acute distress HEENT: Normal NECK: No JVD; No carotid bruits LYMPHATICS: No lymphadenopathy CARDIAC: RRR, no murmurs, no rubs, no gallops RESPIRATORY:  Clear to auscultation without rales, wheezing or rhonchi  ABDOMEN: Soft, non-tender, non-distended MUSCULOSKELETAL:  No edema; No deformity  SKIN: Warm and dry LOWER EXTREMITIES: no swelling NEUROLOGIC:  Alert and oriented x 3 PSYCHIATRIC:  Normal affect   ASSESSMENT:    1. Essential hypertension   2. Precordial pain   3. Dyslipidemia   4. Cancer, metastatic to bone [facility])    PLAN:    In order of problems listed above:  1. Essential hypertension blood pressure well controlled continue present management 2. Precordial chest pain denies having any 3. Dyslipidemia he is on Zetia which I will continue 4. Cancer with metastasis to the bone doing well from that point review   Medication Adjustments/Labs and Tests Ordered: Current medicines are reviewed at length with the patient today.  Concerns regarding medicines are outlined above.  No orders of the defined types were placed in this encounter.  Medication changes: No orders of the defined types were placed in this encounter.   Signed, Park Liter, MD, [facility] 11/04/2018 2:11 PM    [place]

## 2018-11-04 NOTE — Patient Instructions (Signed)
Medication Instructions:  Your physician recommends that you continue on your current medications as directed. Please refer to the Current Medication list given to you today.  *If you need a refill on your cardiac medications before your next appointment, please call your pharmacy*  Lab Work: None.  If you have labs (blood work) drawn today and your tests are completely normal, you will receive your results only by: . MyChart Message (if you have MyChart) OR . A paper copy in the mail If you have any lab test that is abnormal or we need to change your treatment, we will call you to review the results.  Testing/Procedures: None.   Follow-Up: At [facility], you and your health needs are our priority.  As part of our continuing mission to provide you with exceptional heart care, we have created designated Provider Care Teams.  These Care Teams include your primary Cardiologist (physician) and Advanced Practice Providers (APPs -  Physician Assistants and Nurse Practitioners) who all work together to provide you with the care you need, when you need it.  Your next appointment:   5 month(s)  The format for your next appointment:   In Person  Provider:   Robert Krasowski, MD  Other Instructions   

## 2018-11-10 ENCOUNTER — Telehealth: Payer: Self-pay | Admitting: *Deleted

## 2018-11-10 NOTE — Telephone Encounter (Signed)
Message received from patient stating that he is "out of Zithromax and is still bleeding."  Dr. Marin Olp notified and would like for pt to contact his urologist.  Call placed back to patient and patient notified per order of Dr. Marin Olp to contact his urologist.  Pt states that he does not have a urologist.  Informed pt that I would I would let Dr. Marin Olp know and call him back with MD orders.

## 2018-11-10 NOTE — Telephone Encounter (Signed)
Call placed to patient to notify him that Dr. Marin Olp will be sending in a referral for him to see Dr. Diona Fanti with Urology.  Pt appreciative of call.

## 2018-11-19 ENCOUNTER — Other Ambulatory Visit: Payer: Self-pay

## 2018-11-19 ENCOUNTER — Inpatient Hospital Stay: Payer: Medicare Other

## 2018-11-19 ENCOUNTER — Inpatient Hospital Stay: Payer: Medicare Other | Attending: Hematology & Oncology | Admitting: Hematology & Oncology

## 2018-11-19 ENCOUNTER — Encounter: Payer: Self-pay | Admitting: Hematology & Oncology

## 2018-11-19 VITALS — BP 122/71 | HR 96 | Temp 97.9°F | Resp 18 | Wt 155.0 lb

## 2018-11-19 DIAGNOSIS — C7A8 Other malignant neuroendocrine tumors: Secondary | ICD-10-CM

## 2018-11-19 DIAGNOSIS — C7B8 Other secondary neuroendocrine tumors: Secondary | ICD-10-CM | POA: Insufficient documentation

## 2018-11-19 DIAGNOSIS — Z885 Allergy status to narcotic agent status: Secondary | ICD-10-CM | POA: Diagnosis not present

## 2018-11-19 DIAGNOSIS — C61 Malignant neoplasm of prostate: Secondary | ICD-10-CM | POA: Insufficient documentation

## 2018-11-19 DIAGNOSIS — C7951 Secondary malignant neoplasm of bone: Secondary | ICD-10-CM

## 2018-11-19 DIAGNOSIS — M2550 Pain in unspecified joint: Secondary | ICD-10-CM | POA: Insufficient documentation

## 2018-11-19 DIAGNOSIS — E119 Type 2 diabetes mellitus without complications: Secondary | ICD-10-CM | POA: Diagnosis not present

## 2018-11-19 DIAGNOSIS — R319 Hematuria, unspecified: Secondary | ICD-10-CM | POA: Diagnosis not present

## 2018-11-19 DIAGNOSIS — R531 Weakness: Secondary | ICD-10-CM | POA: Insufficient documentation

## 2018-11-19 DIAGNOSIS — Z5111 Encounter for antineoplastic chemotherapy: Secondary | ICD-10-CM | POA: Diagnosis not present

## 2018-11-19 DIAGNOSIS — Z5112 Encounter for antineoplastic immunotherapy: Secondary | ICD-10-CM | POA: Insufficient documentation

## 2018-11-19 DIAGNOSIS — Z79899 Other long term (current) drug therapy: Secondary | ICD-10-CM | POA: Diagnosis not present

## 2018-11-19 DIAGNOSIS — Z5189 Encounter for other specified aftercare: Secondary | ICD-10-CM | POA: Diagnosis not present

## 2018-11-19 LAB — CMP (CANCER CENTER ONLY)
ALT: 10 U/L (ref 0–44)
AST: 12 U/L — ABNORMAL LOW (ref 15–41)
Albumin: 4.3 g/dL (ref 3.5–5.0)
Alkaline Phosphatase: 59 U/L (ref 38–126)
Anion gap: 9 (ref 5–15)
BUN: 17 mg/dL (ref 8–23)
CO2: 27 mmol/L (ref 22–32)
Calcium: 9.4 mg/dL (ref 8.9–10.3)
Chloride: 102 mmol/L (ref 98–111)
Creatinine: 1.17 mg/dL (ref 0.61–1.24)
GFR, Est AFR Am: >60 mL/min (ref >60)
GFR, Estimated: >60 mL/min (ref >60)
Glucose, Bld: 108 mg/dL — ABNORMAL HIGH (ref 70–99)
Potassium: 4.1 mmol/L (ref 3.5–5.1)
Sodium: 138 mmol/L (ref 135–145)
Total Bilirubin: 0.4 mg/dL (ref 0.3–1.2)
Total Protein: 6.5 g/dL (ref 6.5–8.1)

## 2018-11-19 LAB — CBC WITH DIFFERENTIAL (CANCER CENTER ONLY)
Abs Immature Granulocytes: 0.03 10*3/uL (ref 0.00–0.07)
Basophils Absolute: 0.1 10*3/uL (ref 0.0–0.1)
Basophils Relative: 1 %
Eosinophils Absolute: 0.1 10*3/uL (ref 0.0–0.5)
Eosinophils Relative: 1 %
HCT: 33.5 % — ABNORMAL LOW (ref 39.0–52.0)
Hemoglobin: 10.6 g/dL — ABNORMAL LOW (ref 13.0–17.0)
Immature Granulocytes: 0 %
Lymphocytes Relative: 9 %
Lymphs Abs: 0.7 10*3/uL (ref 0.7–4.0)
MCH: 32.3 pg (ref 26.0–34.0)
MCHC: 31.6 g/dL (ref 30.0–36.0)
MCV: 102.1 fL — ABNORMAL HIGH (ref 80.0–100.0)
Monocytes Absolute: 0.6 10*3/uL (ref 0.1–1.0)
Monocytes Relative: 8 %
Neutro Abs: 6.4 10*3/uL (ref 1.7–7.7)
Neutrophils Relative %: 81 %
Platelet Count: 283 10*3/uL (ref 150–400)
RBC: 3.28 MIL/uL — ABNORMAL LOW (ref 4.22–5.81)
RDW: 16.5 % — ABNORMAL HIGH (ref 11.5–15.5)
WBC Count: 7.9 10*3/uL (ref 4.0–10.5)
nRBC: 0 % (ref 0.0–0.2)

## 2018-11-19 LAB — LACTATE DEHYDROGENASE: LDH: 193 U/L — ABNORMAL HIGH (ref 98–192)

## 2018-11-19 MED ORDER — PEGFILGRASTIM 6 MG/0.6ML ~~LOC~~ PSKT
PREFILLED_SYRINGE | SUBCUTANEOUS | Status: AC
  Filled 2018-11-19: qty 0.6

## 2018-11-19 MED ORDER — PEGFILGRASTIM 6 MG/0.6ML ~~LOC~~ PSKT
6.0000 mg | PREFILLED_SYRINGE | Freq: Once | SUBCUTANEOUS | Status: AC
  Administered 2018-11-19: 6 mg via SUBCUTANEOUS

## 2018-11-19 MED ORDER — PROCHLORPERAZINE MALEATE 10 MG PO TABS
ORAL_TABLET | ORAL | Status: AC
  Filled 2018-11-19: qty 1

## 2018-11-19 MED ORDER — PROCHLORPERAZINE MALEATE 10 MG PO TABS
10.0000 mg | ORAL_TABLET | Freq: Once | ORAL | Status: AC
  Administered 2018-11-19: 10 mg via ORAL

## 2018-11-19 MED ORDER — HEPARIN SOD (PORK) LOCK FLUSH 100 UNIT/ML IV SOLN
500.0000 [IU] | Freq: Once | INTRAVENOUS | Status: AC | PRN
  Administered 2018-11-19: 500 [IU]
  Filled 2018-11-19: qty 5

## 2018-11-19 MED ORDER — DEXAMETHASONE SODIUM PHOSPHATE 10 MG/ML IJ SOLN
10.0000 mg | Freq: Once | INTRAMUSCULAR | Status: AC
  Administered 2018-11-19: 10 mg via INTRAVENOUS

## 2018-11-19 MED ORDER — SODIUM CHLORIDE 0.9 % IV SOLN
Freq: Once | INTRAVENOUS | Status: AC
  Administered 2018-11-19: 11:00:00 via INTRAVENOUS
  Filled 2018-11-19: qty 250

## 2018-11-19 MED ORDER — DEXAMETHASONE SODIUM PHOSPHATE 10 MG/ML IJ SOLN
INTRAMUSCULAR | Status: AC
  Filled 2018-11-19: qty 1

## 2018-11-19 MED ORDER — SODIUM CHLORIDE 0.9% FLUSH
10.0000 mL | INTRAVENOUS | Status: DC | PRN
  Administered 2018-11-19: 10 mL
  Filled 2018-11-19: qty 10

## 2018-11-19 MED ORDER — SODIUM CHLORIDE 0.9 % IV SOLN
80.0000 mg/m2 | Freq: Once | INTRAVENOUS | Status: AC
  Administered 2018-11-19: 150 mg via INTRAVENOUS
  Filled 2018-11-19: qty 15

## 2018-11-19 MED ORDER — SODIUM CHLORIDE 0.9 % IV SOLN
200.0000 mg | Freq: Once | INTRAVENOUS | Status: AC
  Administered 2018-11-19: 200 mg via INTRAVENOUS
  Filled 2018-11-19: qty 8

## 2018-11-19 MED FILL — SOLIFENACIN SUCCINATE 10 MG: 10 | 30 days supply | Qty: 30 | Fill #1

## 2018-11-19 MED FILL — DEXAMETHASONE 4 MG TABLET: 4 | 15 days supply | Qty: 60 | Fill #3

## 2018-11-19 NOTE — Patient Instructions (Signed)
[place] Discharge Instructions for Patients Receiving Chemotherapy  Today you received the following chemotherapy agents Keytruda//Taxotere To help prevent nausea and vomiting after your treatment, we encourage you to take your nausea medication as prescribed.   If you develop nausea and vomiting that is not controlled by your nausea medication, call the clinic.   BELOW ARE SYMPTOMS THAT SHOULD BE REPORTED IMMEDIATELY:  *FEVER GREATER THAN 100.5 F  *CHILLS WITH OR WITHOUT FEVER  NAUSEA AND VOMITING THAT IS NOT CONTROLLED WITH YOUR NAUSEA MEDICATION  *UNUSUAL SHORTNESS OF BREATH  *UNUSUAL BRUISING OR BLEEDING  TENDERNESS IN MOUTH AND THROAT WITH OR WITHOUT PRESENCE OF ULCERS  *URINARY PROBLEMS  *BOWEL PROBLEMS  UNUSUAL RASH Items with * indicate a potential emergency and should be followed up as soon as possible.  Feel free to call the clinic should you have any questions or concerns. The clinic phone number is [phone].  Please show the [place] at check-in to the Emergency Department and triage nurse.

## 2018-11-19 NOTE — Progress Notes (Signed)
Hematology and Oncology Follow Up Visit  [name] [id number] 05-18-1945 73 y.o. 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  [name] is back for follow-up.  He looks quite good.  I must say that he has done quite nicely with treatment.  For right now, everything is going pretty well.  He feels okay.  He is still having some of the hematuria.  I spoke with urology last week.  Have not yet contacted Mr. Symmonds.  He is having no pain with urination.  He is having no nausea or vomiting.  He has had no cough.  His diabetes has been doing pretty well.  He has had no problems with respect to leg swelling.  He has a brace on his right lower leg.  He is getting around okay.  He is had no headache.  Overall, his performance status is ECOG 1.      Medications:  Current Outpatient Medications:  .  acetaminophen (TYLENOL) 500 MG tablet, Take 1,000 mg by mouth every 4 (four) hours as needed for moderate pain., Disp: , Rfl:  .  aspirin 81 MG tablet, Take 81 mg by mouth daily., Disp: , Rfl:  .  CALCIUM-IRON-VIT D-VIT K PO, Take 1 tablet by mouth 2 (two) times daily., Disp: , Rfl:  .  dexamethasone (DECADRON) 4 MG tablet, Take 8 mg by mouth 2 (two) times daily with a meal. , Disp: , Rfl:   .  docusate sodium (COLACE) 100 MG capsule, Take 200 mg by mouth 2 (two) times daily., Disp: , Rfl:  .  gabapentin (NEURONTIN) 300 MG capsule, TAKE 1 CAPSULE BY MOUTH TWICE A DAY THEN TAKE 2 CAPSULES AT BEDTIME (Patient taking differently: Take 300 mg by mouth 2 (two) times daily. ), Disp: 120 capsule, Rfl: 4 .  isosorbide mononitrate (IMDUR) 30 MG 24 hr tablet, Take 1 tablet (30 mg total) by mouth daily., Disp: 90 tablet, Rfl: 1 .  OVER THE COUNTER MEDICATION, every morning. Juice Plus -- 8 capsule of each the garden, vineyard, and orchard twice a day., Disp: , Rfl:  .  pantoprazole (PROTONIX) 40 MG tablet, Take 1 tablet (40 mg total) by mouth at bedtime., Disp: 30 tablet, Rfl: 6 .  solifenacin (VESICARE) 10 MG tablet, Take 1 tablet (10 mg total) by mouth daily., Disp: 30 tablet, Rfl: 5 .  ZETIA 10 MG tablet, Take 10 mg by mouth daily. , Disp: , Rfl:  .  azithromycin (ZITHROMAX) 500 MG tablet, Take 1 tablet (500 mg total) by mouth daily. (Patient not taking: Reported on 11/19/2018), Disp: 7 tablet, Rfl: 0 .  nitroGLYCERIN (NITROSTAT) 0.4 MG SL tablet, Place 1 tablet (0.4 mg total) under the tongue every  5 (five) minutes as needed for chest pain. (Patient not taking: Reported on 11/19/2018), Disp: 25 tablet, Rfl: 11 .  ondansetron (ZOFRAN) 8 MG tablet, Take 1 tablet (8 mg total) by mouth 2 (two) times daily as needed for nausea or vomiting. (Patient not taking: Reported on 11/19/2018), Disp: 20 tablet, Rfl: 0 .  prochlorperazine (COMPAZINE) 10 MG tablet, Take 10 mg by mouth every 6 (six) hours as needed (Nausea or vomiting)., Disp: , Rfl:  .  sucralfate (CARAFATE) 1 GM/10ML suspension, Take 10 mLs (1 g total) by mouth 4 (four) times daily -  with meals and at bedtime. (Patient not taking: Reported on 11/19/2018), Disp: 420 mL, Rfl: 5 No current facility-administered medications for this visit.   Facility-Administered Medications Ordered in Other Visits:  .  octreotide (SANDOSTATIN LAR) IM injection 30  mg, 30 mg, Intramuscular, Once, Ennever, Rudell Cobb, MD  Allergies:  Allergies  Allergen Reactions  . Codeine Nausea And Vomiting  . Hydrocodone Nausea And Vomiting    Past Medical History, Surgical history, Social history, and Family History were reviewed and updated.  Review of Systems: Review of Systems  Constitutional: Negative.   HENT: Negative.   Eyes: Negative.   Respiratory: Negative.   Cardiovascular: Negative.   Gastrointestinal: Negative.   Genitourinary: Negative.  Negative for urgency.  Musculoskeletal: Positive for joint pain.  Skin: Negative.   Neurological: Positive for focal weakness.  Endo/Heme/Allergies: Negative.   Psychiatric/Behavioral: Negative.   All other systems reviewed and are negative.    Physical Exam:  weight is 155 lb (70.3 kg). His temporal temperature is 97.9 F (36.6 C). His blood pressure is 122/71 and his pulse is 96. His respiration is 18 and oxygen saturation is 100%.   Physical Exam Vitals signs reviewed.  HENT:     Head: Normocephalic and atraumatic.  Eyes:     Pupils: Pupils are equal, round, and reactive to light.  Neck:     Musculoskeletal: Normal range of motion.  Cardiovascular:     Rate and Rhythm: Normal rate and regular rhythm.     Heart sounds: Normal heart sounds.  Pulmonary:     Effort: Pulmonary effort is normal.     Breath sounds: Normal breath sounds.  Abdominal:     General: Bowel sounds are normal.     Palpations: Abdomen is soft.  Musculoskeletal: Normal range of motion.        General: No tenderness or deformity.  Lymphadenopathy:     Cervical: No cervical adenopathy.  Skin:    General: Skin is warm and dry.     Findings: No erythema or rash.  Neurological:     Mental Status: He is alert and oriented to person, place, and time.  Psychiatric:        Behavior: Behavior normal.        Thought Content: Thought content normal.        Judgment: Judgment normal.      Lab Results  Component Value Date    WBC 7.9 11/19/2018   HGB 10.6 (L) 11/19/2018   HCT 33.5 (L) 11/19/2018   MCV 102.1 (H) 11/19/2018   PLT 283 11/19/2018     Chemistry      Component Value Date/Time   NA 138 11/19/2018 0950   NA 144 12/14/2016 1159   NA 140 10/22/2016 1129   K 4.1 11/19/2018 0950   K 3.5 12/14/2016 1159   K 4.3 10/22/2016 1129   CL 102 11/19/2018 0950   CL 103  12/14/2016 1159   CO2 27 11/19/2018 0950   CO2 30 12/14/2016 1159   CO2 27 10/22/2016 1129   BUN 17 11/19/2018 0950   BUN 16 12/14/2016 1159   BUN 14.0 10/22/2016 1129   CREATININE 1.17 11/19/2018 0950   CREATININE 1.3 (H) 12/14/2016 1159   CREATININE 1.1 10/22/2016 1129      Component Value Date/Time   CALCIUM 9.4 11/19/2018 0950   CALCIUM 10.0 12/14/2016 1159   CALCIUM 10.4 10/22/2016 1129   ALKPHOS 59 11/19/2018 0950   ALKPHOS 48 12/14/2016 1159   ALKPHOS 60 10/22/2016 1129   AST 12 (L) 11/19/2018 0950   AST 16 10/22/2016 1129   ALT 10 11/19/2018 0950   ALT 23 12/14/2016 1159   ALT 14 10/22/2016 1129   BILITOT 0.4 11/19/2018 0950   BILITOT 0.31 10/22/2016 1129         Impression and Plan: Mr. Spitler is 73 year old gentleman with 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  We will go ahead with a fourth cycle of treatment.  I probably would do a PET scan after his fifth cycle of treatment.  How long we treat him will really depend upon his tolerability and the response.  We will also continue him also on immunotherapy.  We do have the new agent for neuroendocrine carcinoma.  This is called lurbectinib.  This seems to be fairly well tolerated.    I will plan to see him back in 4 weeks.  I would like to see him back after Thanksgiving.  I just do not want to treat him that the day before Thanksgiving and that he not enjoy the Thanksgiving holiday with his family.    [name], MD 11/4/202010:33 AM

## 2018-11-20 LAB — CHROMOGRANIN A: Chromogranin A (ng/mL): 317.6 ng/mL — ABNORMAL HIGH (ref 0.0–101.8)

## 2018-11-21 ENCOUNTER — Encounter: Payer: Self-pay | Admitting: *Deleted

## 2018-11-21 NOTE — Progress Notes (Signed)
Pt.'s records faxed to Dr. Diona Fanti at [facility] for pt.'s appt on Monday, 11/24/18.

## 2018-11-24 DIAGNOSIS — N393 Stress incontinence (female) (male): Secondary | ICD-10-CM | POA: Diagnosis not present

## 2018-11-24 DIAGNOSIS — L271 Localized skin eruption due to drugs and medicaments taken internally: Secondary | ICD-10-CM | POA: Diagnosis not present

## 2018-11-25 DIAGNOSIS — S161XXA Strain of muscle, fascia and tendon at neck level, initial encounter: Secondary | ICD-10-CM | POA: Diagnosis not present

## 2018-11-25 DIAGNOSIS — S336XXA Sprain of sacroiliac joint, initial encounter: Secondary | ICD-10-CM | POA: Diagnosis not present

## 2018-11-25 DIAGNOSIS — M9903 Segmental and somatic dysfunction of lumbar region: Secondary | ICD-10-CM | POA: Diagnosis not present

## 2018-11-25 DIAGNOSIS — M9902 Segmental and somatic dysfunction of thoracic region: Secondary | ICD-10-CM | POA: Diagnosis not present

## 2018-11-25 DIAGNOSIS — M9905 Segmental and somatic dysfunction of pelvic region: Secondary | ICD-10-CM | POA: Diagnosis not present

## 2018-11-25 DIAGNOSIS — S233XXA Sprain of ligaments of thoracic spine, initial encounter: Secondary | ICD-10-CM | POA: Diagnosis not present

## 2018-11-25 DIAGNOSIS — S335XXA Sprain of ligaments of lumbar spine, initial encounter: Secondary | ICD-10-CM | POA: Diagnosis not present

## 2018-11-25 DIAGNOSIS — S39012A Strain of muscle, fascia and tendon of lower back, initial encounter: Secondary | ICD-10-CM | POA: Diagnosis not present

## 2018-11-27 DIAGNOSIS — S336XXA Sprain of sacroiliac joint, initial encounter: Secondary | ICD-10-CM | POA: Diagnosis not present

## 2018-11-27 DIAGNOSIS — S161XXA Strain of muscle, fascia and tendon at neck level, initial encounter: Secondary | ICD-10-CM | POA: Diagnosis not present

## 2018-11-27 DIAGNOSIS — S233XXA Sprain of ligaments of thoracic spine, initial encounter: Secondary | ICD-10-CM | POA: Diagnosis not present

## 2018-11-27 DIAGNOSIS — S335XXA Sprain of ligaments of lumbar spine, initial encounter: Secondary | ICD-10-CM | POA: Diagnosis not present

## 2018-11-27 DIAGNOSIS — M9903 Segmental and somatic dysfunction of lumbar region: Secondary | ICD-10-CM | POA: Diagnosis not present

## 2018-11-27 DIAGNOSIS — M9902 Segmental and somatic dysfunction of thoracic region: Secondary | ICD-10-CM | POA: Diagnosis not present

## 2018-11-27 DIAGNOSIS — S39012A Strain of muscle, fascia and tendon of lower back, initial encounter: Secondary | ICD-10-CM | POA: Diagnosis not present

## 2018-11-27 DIAGNOSIS — M9905 Segmental and somatic dysfunction of pelvic region: Secondary | ICD-10-CM | POA: Diagnosis not present

## 2018-12-01 MED FILL — PANTOPRAZOLE SOD DR 40 MG T: 40 | 30 days supply | Qty: 30 | Fill #1

## 2018-12-01 MED FILL — EZETIMIBE 10 MG TABS: 10 | 90 days supply | Qty: 90 | Fill #0

## 2018-12-04 DIAGNOSIS — S161XXA Strain of muscle, fascia and tendon at neck level, initial encounter: Secondary | ICD-10-CM | POA: Diagnosis not present

## 2018-12-04 DIAGNOSIS — S335XXA Sprain of ligaments of lumbar spine, initial encounter: Secondary | ICD-10-CM | POA: Diagnosis not present

## 2018-12-04 DIAGNOSIS — S39012A Strain of muscle, fascia and tendon of lower back, initial encounter: Secondary | ICD-10-CM | POA: Diagnosis not present

## 2018-12-04 DIAGNOSIS — S233XXA Sprain of ligaments of thoracic spine, initial encounter: Secondary | ICD-10-CM | POA: Diagnosis not present

## 2018-12-04 DIAGNOSIS — S336XXA Sprain of sacroiliac joint, initial encounter: Secondary | ICD-10-CM | POA: Diagnosis not present

## 2018-12-04 DIAGNOSIS — M9902 Segmental and somatic dysfunction of thoracic region: Secondary | ICD-10-CM | POA: Diagnosis not present

## 2018-12-04 DIAGNOSIS — M9905 Segmental and somatic dysfunction of pelvic region: Secondary | ICD-10-CM | POA: Diagnosis not present

## 2018-12-04 DIAGNOSIS — M9903 Segmental and somatic dysfunction of lumbar region: Secondary | ICD-10-CM | POA: Diagnosis not present

## 2018-12-17 ENCOUNTER — Inpatient Hospital Stay: Payer: Medicare Other

## 2018-12-17 ENCOUNTER — Other Ambulatory Visit: Payer: Self-pay

## 2018-12-17 ENCOUNTER — Other Ambulatory Visit: Payer: Self-pay | Admitting: Cardiology

## 2018-12-17 ENCOUNTER — Inpatient Hospital Stay: Payer: Medicare Other | Attending: Hematology & Oncology | Admitting: Hematology & Oncology

## 2018-12-17 VITALS — HR 80

## 2018-12-17 VITALS — BP 124/69 | HR 101 | Temp 97.7°F | Resp 18 | Ht 68.0 in | Wt 157.0 lb

## 2018-12-17 DIAGNOSIS — C7A8 Other malignant neuroendocrine tumors: Secondary | ICD-10-CM

## 2018-12-17 DIAGNOSIS — M2550 Pain in unspecified joint: Secondary | ICD-10-CM | POA: Insufficient documentation

## 2018-12-17 DIAGNOSIS — R531 Weakness: Secondary | ICD-10-CM | POA: Diagnosis not present

## 2018-12-17 DIAGNOSIS — Z5189 Encounter for other specified aftercare: Secondary | ICD-10-CM | POA: Insufficient documentation

## 2018-12-17 DIAGNOSIS — C7951 Secondary malignant neoplasm of bone: Secondary | ICD-10-CM

## 2018-12-17 DIAGNOSIS — Z79899 Other long term (current) drug therapy: Secondary | ICD-10-CM | POA: Insufficient documentation

## 2018-12-17 DIAGNOSIS — C61 Malignant neoplasm of prostate: Secondary | ICD-10-CM | POA: Diagnosis present

## 2018-12-17 DIAGNOSIS — Z885 Allergy status to narcotic agent status: Secondary | ICD-10-CM | POA: Insufficient documentation

## 2018-12-17 DIAGNOSIS — C7B8 Other secondary neuroendocrine tumors: Secondary | ICD-10-CM | POA: Diagnosis present

## 2018-12-17 DIAGNOSIS — Z5112 Encounter for antineoplastic immunotherapy: Secondary | ICD-10-CM | POA: Insufficient documentation

## 2018-12-17 DIAGNOSIS — Z5111 Encounter for antineoplastic chemotherapy: Secondary | ICD-10-CM | POA: Diagnosis not present

## 2018-12-17 LAB — CBC WITH DIFFERENTIAL (CANCER CENTER ONLY)
Abs Immature Granulocytes: 0.02 10*3/uL (ref 0.00–0.07)
Basophils Absolute: 0.1 10*3/uL (ref 0.0–0.1)
Basophils Relative: 1 %
Eosinophils Absolute: 1.2 10*3/uL — ABNORMAL HIGH (ref 0.0–0.5)
Eosinophils Relative: 13 %
HCT: 33.1 % — ABNORMAL LOW (ref 39.0–52.0)
Hemoglobin: 10.8 g/dL — ABNORMAL LOW (ref 13.0–17.0)
Immature Granulocytes: 0 %
Lymphocytes Relative: 14 %
Lymphs Abs: 1.3 10*3/uL (ref 0.7–4.0)
MCH: 33.4 pg (ref 26.0–34.0)
MCHC: 32.6 g/dL (ref 30.0–36.0)
MCV: 102.5 fL — ABNORMAL HIGH (ref 80.0–100.0)
Monocytes Absolute: 0.6 10*3/uL (ref 0.1–1.0)
Monocytes Relative: 6 %
Neutro Abs: 6.4 10*3/uL (ref 1.7–7.7)
Neutrophils Relative %: 66 %
Platelet Count: 340 10*3/uL (ref 150–400)
RBC: 3.23 MIL/uL — ABNORMAL LOW (ref 4.22–5.81)
RDW: 14.7 % (ref 11.5–15.5)
WBC Count: 9.7 10*3/uL (ref 4.0–10.5)
nRBC: 0 % (ref 0.0–0.2)

## 2018-12-17 LAB — CMP (CANCER CENTER ONLY)
ALT: 8 U/L (ref 0–44)
AST: 12 U/L — ABNORMAL LOW (ref 15–41)
Albumin: 4.4 g/dL (ref 3.5–5.0)
Alkaline Phosphatase: 69 U/L (ref 38–126)
Anion gap: 8 (ref 5–15)
BUN: 21 mg/dL (ref 8–23)
CO2: 25 mmol/L (ref 22–32)
Calcium: 9.1 mg/dL (ref 8.9–10.3)
Chloride: 104 mmol/L (ref 98–111)
Creatinine: 1.11 mg/dL (ref 0.61–1.24)
GFR, Est AFR Am: >60 mL/min (ref >60)
GFR, Estimated: >60 mL/min (ref >60)
Glucose, Bld: 125 mg/dL — ABNORMAL HIGH (ref 70–99)
Potassium: 4.3 mmol/L (ref 3.5–5.1)
Sodium: 137 mmol/L (ref 135–145)
Total Bilirubin: 0.4 mg/dL (ref 0.3–1.2)
Total Protein: 6.8 g/dL (ref 6.5–8.1)

## 2018-12-17 LAB — LACTATE DEHYDROGENASE: LDH: 222 U/L — ABNORMAL HIGH (ref 98–192)

## 2018-12-17 MED ORDER — SODIUM CHLORIDE 0.9 % IV SOLN
200.0000 mg | Freq: Once | INTRAVENOUS | Status: AC
  Administered 2018-12-17: 200 mg via INTRAVENOUS
  Filled 2018-12-17: qty 8

## 2018-12-17 MED ORDER — DEXAMETHASONE SODIUM PHOSPHATE 10 MG/ML IJ SOLN
INTRAMUSCULAR | Status: AC
  Filled 2018-12-17: qty 1

## 2018-12-17 MED ORDER — PROCHLORPERAZINE MALEATE 10 MG PO TABS
ORAL_TABLET | ORAL | Status: AC
  Filled 2018-12-17: qty 1

## 2018-12-17 MED ORDER — SODIUM CHLORIDE 0.9 % IV SOLN
Freq: Once | INTRAVENOUS | Status: AC
  Administered 2018-12-17: 10:00:00 via INTRAVENOUS
  Filled 2018-12-17: qty 250

## 2018-12-17 MED ORDER — PEGFILGRASTIM 6 MG/0.6ML ~~LOC~~ PSKT
PREFILLED_SYRINGE | SUBCUTANEOUS | Status: AC
  Filled 2018-12-17: qty 0.6

## 2018-12-17 MED ORDER — DEXAMETHASONE SODIUM PHOSPHATE 10 MG/ML IJ SOLN
10.0000 mg | Freq: Once | INTRAMUSCULAR | Status: AC
  Administered 2018-12-17: 10 mg via INTRAVENOUS

## 2018-12-17 MED ORDER — PROCHLORPERAZINE MALEATE 10 MG PO TABS
10.0000 mg | ORAL_TABLET | Freq: Once | ORAL | Status: AC
  Administered 2018-12-17: 10:00:00 10 mg via ORAL

## 2018-12-17 MED ORDER — SODIUM CHLORIDE 0.9 % IV SOLN
80.0000 mg/m2 | Freq: Once | INTRAVENOUS | Status: AC
  Administered 2018-12-17: 12:00:00 150 mg via INTRAVENOUS
  Filled 2018-12-17: qty 15

## 2018-12-17 MED ORDER — PEGFILGRASTIM 6 MG/0.6ML ~~LOC~~ PSKT
6.0000 mg | PREFILLED_SYRINGE | Freq: Once | SUBCUTANEOUS | Status: AC
  Administered 2018-12-17: 6 mg via SUBCUTANEOUS

## 2018-12-17 MED ORDER — HEPARIN SOD (PORK) LOCK FLUSH 100 UNIT/ML IV SOLN
500.0000 [IU] | Freq: Once | INTRAVENOUS | Status: AC | PRN
  Administered 2018-12-17: 500 [IU]
  Filled 2018-12-17: qty 5

## 2018-12-17 MED ORDER — SODIUM CHLORIDE 0.9% FLUSH
10.0000 mL | INTRAVENOUS | Status: DC | PRN
  Administered 2018-12-17: 10 mL
  Filled 2018-12-17: qty 10

## 2018-12-17 MED FILL — ISOSORBIDE MN ER 30 MG TAB: 30 | 90 days supply | Qty: 90 | Fill #0

## 2018-12-17 MED FILL — SOLIFENACIN SUCCINATE 10 MG: 10 | 30 days supply | Qty: 30 | Fill #2

## 2018-12-17 MED FILL — LIDOCAINE-PRILOCAINE CREAM: 2.5-2.5 | 30 days supply | Qty: 30 | Fill #1

## 2018-12-17 NOTE — Progress Notes (Signed)
Hematology and Oncology Follow Up Visit  [name] [id number] 1945/06/17 73 y.o. 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 is back for follow-up.  He looks quite good.  He feels good.  He did have a good Thanksgiving.  He was with his family.  He has had no problems with cough.  I think that the urinary issues might be a little bit better.  He has lost his hair.  I told him this was from the chemotherapy.  He has had no problems with pain.  He has had no problems with bowels or bladder.  I think he may have little bit of constipation.  There is been no problems with rashes.  He has had no fever.  Overall, his performance status is ECOG 1.       Medications:  Current Outpatient Medications:  .  acetaminophen (TYLENOL) 500 MG tablet, Take 1,000 mg by mouth every 4 (four) hours as needed for moderate pain., Disp: , Rfl:  .  aspirin 81 MG tablet, Take 81 mg by mouth daily., Disp: , Rfl:  .  CALCIUM-IRON-VIT D-VIT K PO, Take 1 tablet by mouth 2 (two) times daily., Disp: , Rfl:  .  dexamethasone (DECADRON) 4 MG tablet, Take 8 mg by mouth 2 (two) times daily with a meal. , Disp: , Rfl:  .  docusate sodium (COLACE) 100 MG capsule, Take 200 mg by mouth 2 (two) times  daily., Disp: , Rfl:  .  gabapentin (NEURONTIN) 300 MG capsule, TAKE 1 CAPSULE BY MOUTH TWICE A DAY THEN TAKE 2 CAPSULES AT BEDTIME (Patient taking differently: Take 300 mg by mouth 2 (two) times daily. ), Disp: 120 capsule, Rfl: 4 .  isosorbide mononitrate (IMDUR) 30 MG 24 hr tablet, Take 1 tablet (30 mg total) by mouth daily., Disp: 90 tablet, Rfl: 1 .  ondansetron (ZOFRAN) 8 MG tablet, Take 1 tablet (8 mg total) by mouth 2 (two) times daily as needed for nausea or vomiting., Disp: 20 tablet, Rfl: 0 .  OVER THE COUNTER MEDICATION, every morning. Juice Plus -- 8 capsule of each the garden, vineyard, and orchard twice a day., Disp: , Rfl:  .  pantoprazole (PROTONIX) 40 MG tablet, Take 1 tablet (40 mg total) by mouth at bedtime., Disp: 30 tablet, Rfl: 6 .  prochlorperazine (COMPAZINE) 10 MG tablet, Take 10 mg by mouth every 6 (six) hours as needed (Nausea or vomiting)., Disp: , Rfl:  .  solifenacin (VESICARE) 10 MG tablet, Take 1 tablet (10 mg total) by mouth daily., Disp: 30 tablet, Rfl: 5 .  sucralfate (CARAFATE) 1 GM/10ML suspension, Take 10 mLs (1 g total) by mouth 4 (four) times daily -  with  meals and at bedtime., Disp: 420 mL, Rfl: 5 .  ZETIA 10 MG tablet, Take 10 mg by mouth daily. , Disp: , Rfl:  .  nitroGLYCERIN (NITROSTAT) 0.4 MG SL tablet, Place 1 tablet (0.4 mg total) under the tongue every 5 (five) minutes as needed for chest pain. (Patient not taking: Reported on 11/19/2018), Disp: 25 tablet, Rfl: 11 No current facility-administered medications for this visit.   Facility-Administered Medications Ordered in Other Visits:  .  octreotide (SANDOSTATIN LAR) IM injection 30 mg, 30 mg, Intramuscular, Once, ,  R, MD .  sodium chloride flush (NS) 0.9 % injection 10 mL, 10 mL, Intracatheter, PRN, [name], MD, 10 mL at 12/17/18 1244  Allergies:  Allergies  Allergen Reactions  . Codeine Nausea And Vomiting  . Hydrocodone Nausea And Vomiting    Past Medical History,  Surgical history, Social history, and Family History were reviewed and updated.  Review of Systems: Review of Systems  Constitutional: Negative.   HENT: Negative.   Eyes: Negative.   Respiratory: Negative.   Cardiovascular: Negative.   Gastrointestinal: Negative.   Genitourinary: Negative.  Negative for urgency.  Musculoskeletal: Positive for joint pain.  Skin: Negative.   Neurological: Positive for focal weakness.  Endo/Heme/Allergies: Negative.   Psychiatric/Behavioral: Negative.   All other systems reviewed and are negative.    Physical Exam:  height is 5\' 8"  (1.727 m) and weight is 157 lb (71.2 kg). His temporal temperature is 97.7 F (36.5 C). His blood pressure is 124/69 and his pulse is 101 (abnormal). His respiration is 18 and oxygen saturation is 100%. PET scan  Physical Exam Vitals signs reviewed.  HENT:     Head: Normocephalic and atraumatic.  Eyes:     Pupils: Pupils are equal, round, and reactive to light.  Neck:     Musculoskeletal: Normal range of motion.  Cardiovascular:     Rate and Rhythm: Normal rate and regular rhythm.     Heart sounds: Normal heart sounds.  Pulmonary:     Effort: Pulmonary effort is normal.     Breath sounds: Normal breath sounds.  Abdominal:     General: Bowel sounds are normal.     Palpations: Abdomen is soft.  Musculoskeletal: Normal range of motion.        General: No tenderness or deformity.  Lymphadenopathy:     Cervical: No cervical adenopathy.  Skin:    General: Skin is warm and dry.     Findings: No erythema or rash.  Neurological:     Mental Status: He is alert and oriented to person, place, and time.  Psychiatric:        Behavior: Behavior normal.        Thought Content: Thought content normal.        Judgment: Judgment normal.      Lab Results  Component Value Date   WBC 9.7 12/17/2018   HGB 10.8 (L) 12/17/2018   HCT 33.1 (L) 12/17/2018   MCV 102.5 (H) 12/17/2018   PLT 340 12/17/2018     Chemistry       Component Value Date/Time   NA 137 12/17/2018 0918   NA 144 12/14/2016 1159   NA 140 10/22/2016 1129   K 4.3 12/17/2018 0918   K 3.5 12/14/2016 1159   K 4.3 10/22/2016 1129   CL 104 12/17/2018 0918   CL 103 12/14/2016 1159   CO2 25 12/17/2018 0918   CO2 30 12/14/2016 1159   CO2 27 10/22/2016 1129  BUN 21 12/17/2018 0918   BUN 16 12/14/2016 1159   BUN 14.0 10/22/2016 1129   CREATININE 1.11 12/17/2018 0918   CREATININE 1.3 (H) 12/14/2016 1159   CREATININE 1.1 10/22/2016 1129      Component Value Date/Time   CALCIUM 9.1 12/17/2018 0918   CALCIUM 10.0 12/14/2016 1159   CALCIUM 10.4 10/22/2016 1129   ALKPHOS 69 12/17/2018 0918   ALKPHOS 48 12/14/2016 1159   ALKPHOS 60 10/22/2016 1129   AST 12 (L) 12/17/2018 0918   AST 16 10/22/2016 1129   ALT 8 12/17/2018 0918   ALT 23 12/14/2016 1159   ALT 14 10/22/2016 1129   BILITOT 0.4 12/17/2018 0918   BILITOT 0.31 10/22/2016 1129         Impression and Plan: Mr. 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  We will go ahead with a fifth cycle of treatment.  I probably would do a PET scan after his fifth cycle of treatment.  How long we treat him will really depend upon his tolerability and the response.  We will also continue him also on immunotherapy.  We do have the new agent for neuroendocrine carcinoma.  This is called lurbinectinib.  This seems to be fairly well tolerated.    I will plan to see him back in 4 weeks.  I would like to see him back after Christmas.  I just do not want to treat him that the day before Christmas and that he not enjoy the Christmas holiday with his family.    [name], MD 12/2/20201:29 PM

## 2018-12-17 NOTE — Patient Instructions (Addendum)
Pembrolizumab injection What is this medicine? PEMBROLIZUMAB (pem broe liz ue mab) is a monoclonal antibody. It is used to treat bladder cancer, cervical cancer, endometrial cancer, esophageal cancer, head and neck cancer, hepatocellular cancer, Hodgkin lymphoma, kidney cancer, lymphoma, melanoma, Merkel cell carcinoma, lung cancer, stomach cancer, urothelial cancer, and cancers that have a certain genetic condition. This medicine may be used for other purposes; ask your health care provider or pharmacist if you have questions. COMMON BRAND NAME(S): Keytruda What should I tell my health care provider before I take this medicine? They need to know if you have any of these conditions:  diabetes  immune system problems  inflammatory bowel disease  liver disease  lung or breathing disease  lupus  received or scheduled to receive an organ transplant or a stem-cell transplant that uses donor stem cells  an unusual or allergic reaction to pembrolizumab, other medicines, foods, dyes, or preservatives  pregnant or trying to get pregnant  breast-feeding How should I use this medicine? This medicine is for infusion into a vein. It is given by a health care professional in a hospital or clinic setting. A special MedGuide will be given to you before each treatment. Be sure to read this information carefully each time. Talk to your pediatrician regarding the use of this medicine in children. While this drug may be prescribed for selected conditions, precautions do apply. Overdosage: If you think you have taken too much of this medicine contact a poison control center or emergency room at once. NOTE: This medicine is only for you. Do not share this medicine with others. What if I miss a dose? It is important not to miss your dose. Call your doctor or health care professional if you are unable to keep an appointment. What may interact with this medicine? Interactions have not been studied. Give  your health care provider a list of all the medicines, herbs, non-prescription drugs, or dietary supplements you use. Also tell them if you smoke, drink alcohol, or use illegal drugs. Some items may interact with your medicine. This list may not describe all possible interactions. Give your health care provider a list of all the medicines, herbs, non-prescription drugs, or dietary supplements you use. Also tell them if you smoke, drink alcohol, or use illegal drugs. Some items may interact with your medicine. What should I watch for while using this medicine? Your condition will be monitored carefully while you are receiving this medicine. You may need blood work done while you are taking this medicine. Do not become pregnant while taking this medicine or for 4 months after stopping it. Women should inform their doctor if they wish to become pregnant or think they might be pregnant. There is a potential for serious side effects to an unborn child. Talk to your health care professional or pharmacist for more information. Do not breast-feed an infant while taking this medicine or for 4 months after the last dose. What side effects may I notice from receiving this medicine? Side effects that you should report to your doctor or health care professional as soon as possible:  allergic reactions like skin rash, itching or hives, swelling of the face, lips, or tongue  bloody or black, tarry  breathing problems  changes in vision  chest pain  chills  confusion  constipation  cough  diarrhea  dizziness or feeling faint or lightheaded  fast or irregular heartbeat  fever  flushing  hair loss  joint pain  low blood counts - this  medicine may decrease the number of white blood cells, red blood cells and platelets. You may be at increased risk for infections and bleeding.  muscle pain  muscle weakness  persistent headache  redness, blistering, peeling or loosening of the skin,  including inside the mouth  signs and symptoms of high blood sugar such as dizziness; dry mouth; dry skin; fruity breath; nausea; stomach pain; increased hunger or thirst; increased urination  signs and symptoms of kidney injury like trouble passing urine or change in the amount of urine  signs and symptoms of liver injury like dark urine, light-colored stools, loss of appetite, nausea, right upper belly pain, yellowing of the eyes or skin  sweating  swollen lymph nodes  weight loss Side effects that usually do not require medical attention (report to your doctor or health care professional if they continue or are bothersome):  decreased appetite  muscle pain  tiredness This list may not describe all possible side effects. Call your doctor for medical advice about side effects. You may report side effects to FDA at 1-800-FDA-1088. Where should I keep my medicine? This drug is given in a hospital or clinic and will not be stored at home. NOTE: This sheet is a summary. It may not cover all possible information. If you have questions about this medicine, talk to your doctor, pharmacist, or health care provider.  2020 Elsevier/Gold Standard (2018-01-28 13:46:58) Pegfilgrastim injection What is this medicine? PEGFILGRASTIM (PEG fil gra stim) is a long-acting granulocyte colony-stimulating factor that stimulates the growth of neutrophils, a type of white blood cell important in the body's fight against infection. It is used to reduce the incidence of fever and infection in patients with certain types of cancer who are receiving chemotherapy that affects the bone marrow, and to increase survival after being exposed to high doses of radiation. This medicine may be used for other purposes; ask your health care provider or pharmacist if you have questions. COMMON BRAND NAME(S): Steve Rattler, Ziextenzo What should I tell my health care provider before I take this medicine? They  need to know if you have any of these conditions:  kidney disease  latex allergy  ongoing radiation therapy  sickle cell disease  skin reactions to acrylic adhesives (On-Body Injector only)  an unusual or allergic reaction to pegfilgrastim, filgrastim, other medicines, foods, dyes, or preservatives  pregnant or trying to get pregnant  breast-feeding How should I use this medicine? This medicine is for injection under the skin. If you get this medicine at home, you will be taught how to prepare and give the pre-filled syringe or how to use the On-body Injector. Refer to the patient Instructions for Use for detailed instructions. Use exactly as directed. Tell your healthcare provider immediately if you suspect that the On-body Injector may not have performed as intended or if you suspect the use of the On-body Injector resulted in a missed or partial dose. It is important that you put your used needles and syringes in a special sharps container. Do not put them in a trash can. If you do not have a sharps container, call your pharmacist or healthcare provider to get one. Talk to your pediatrician regarding the use of this medicine in children. While this drug may be prescribed for selected conditions, precautions do apply. Overdosage: If you think you have taken too much of this medicine contact a poison control center or emergency room at once. NOTE: This medicine is only for you. Do not share  this medicine with others. What if I miss a dose? It is important not to miss your dose. Call your doctor or health care professional if you miss your dose. If you miss a dose due to an On-body Injector failure or leakage, a new dose should be administered as soon as possible using a single prefilled syringe for manual use. What may interact with this medicine? Interactions have not been studied. Give your health care provider a list of all the medicines, herbs, non-prescription drugs, or dietary  supplements you use. Also tell them if you smoke, drink alcohol, or use illegal drugs. Some items may interact with your medicine. This list may not describe all possible interactions. Give your health care provider a list of all the medicines, herbs, non-prescription drugs, or dietary supplements you use. Also tell them if you smoke, drink alcohol, or use illegal drugs. Some items may interact with your medicine. What should I watch for while using this medicine? You may need blood work done while you are taking this medicine. If you are going to need a MRI, CT scan, or other procedure, tell your doctor that you are using this medicine (On-Body Injector only). What side effects may I notice from receiving this medicine? Side effects that you should report to your doctor or health care professional as soon as possible:  allergic reactions like skin rash, itching or hives, swelling of the face, lips, or tongue  back pain  dizziness  fever  pain, redness, or irritation at site where injected  pinpoint red spots on the skin  red or dark-brown urine  shortness of breath or breathing problems  stomach or side pain, or pain at the shoulder  swelling  tiredness  trouble passing urine or change in the amount of urine Side effects that usually do not require medical attention (report to your doctor or health care professional if they continue or are bothersome):  bone pain  muscle pain This list may not describe all possible side effects. Call your doctor for medical advice about side effects. You may report side effects to FDA at 1-800-FDA-1088. Where should I keep my medicine? Keep out of the reach of children. If you are using this medicine at home, you will be instructed on how to store it. Throw away any unused medicine after the expiration date on the label. NOTE: This sheet is a summary. It may not cover all possible information. If you have questions about this medicine, talk  to your doctor, pharmacist, or health care provider.  2020 Elsevier/Gold Standard (2017-04-08 16:57:08)

## 2018-12-17 NOTE — Patient Instructions (Signed)

## 2018-12-25 MED FILL — ISOSORBIDE MN ER 30 MG TAB: 30 | 90 days supply | Qty: 90 | Fill #0

## 2018-12-29 ENCOUNTER — Other Ambulatory Visit: Payer: Self-pay

## 2018-12-29 ENCOUNTER — Ambulatory Visit (HOSPITAL_COMMUNITY)
Admission: RE | Admit: 2018-12-29 | Discharge: 2018-12-29 | Disposition: A | Discharge status: Home / Self Care | Payer: Medicare Other | Source: Ambulatory Visit | Attending: Hematology & Oncology | Admitting: Hematology & Oncology

## 2018-12-29 DIAGNOSIS — C7A8 Other malignant neuroendocrine tumors: Secondary | ICD-10-CM | POA: Insufficient documentation

## 2018-12-29 MED ORDER — GALLIUM GA 68 DOTATATE IV KIT
3.7000 | PACK | Freq: Once | INTRAVENOUS | Status: AC | PRN
  Administered 2018-12-29: 3.7 via INTRAVENOUS

## 2019-01-05 MED FILL — GABAPENTIN 300 MG CAPSULE: 300 | 30 days supply | Qty: 120 | Fill #2

## 2019-01-07 ENCOUNTER — Ambulatory Visit: Payer: Medicare Other

## 2019-01-07 ENCOUNTER — Other Ambulatory Visit: Payer: Medicare Other

## 2019-01-08 ENCOUNTER — Encounter: Payer: Self-pay | Admitting: *Deleted

## 2019-01-13 ENCOUNTER — Telehealth: Payer: Self-pay | Admitting: Hematology & Oncology

## 2019-01-13 NOTE — Telephone Encounter (Signed)
Per pt's email and ph request faxed medical records to: [facility] F: [phone]    [facility] [place] [birth year]57 Policy  #[id number] and Claim #[id number]

## 2019-01-14 ENCOUNTER — Inpatient Hospital Stay: Payer: Medicare Other

## 2019-01-14 ENCOUNTER — Inpatient Hospital Stay (HOSPITAL_BASED_OUTPATIENT_CLINIC_OR_DEPARTMENT_OTHER): Payer: Medicare Other | Admitting: Hematology & Oncology

## 2019-01-14 ENCOUNTER — Other Ambulatory Visit: Payer: Self-pay

## 2019-01-14 VITALS — BP 135/77 | HR 96 | Temp 97.8°F | Resp 16 | Wt 157.1 lb

## 2019-01-14 DIAGNOSIS — C7A8 Other malignant neuroendocrine tumors: Secondary | ICD-10-CM

## 2019-01-14 DIAGNOSIS — C7951 Secondary malignant neoplasm of bone: Secondary | ICD-10-CM

## 2019-01-14 DIAGNOSIS — Z5112 Encounter for antineoplastic immunotherapy: Secondary | ICD-10-CM | POA: Diagnosis not present

## 2019-01-14 LAB — CMP (CANCER CENTER ONLY)
ALT: 9 U/L (ref 0–44)
AST: 12 U/L — ABNORMAL LOW (ref 15–41)
Albumin: 4.1 g/dL (ref 3.5–5.0)
Alkaline Phosphatase: 68 U/L (ref 38–126)
Anion gap: 8 (ref 5–15)
BUN: 26 mg/dL — ABNORMAL HIGH (ref 8–23)
CO2: 25 mmol/L (ref 22–32)
Calcium: 9.7 mg/dL (ref 8.9–10.3)
Chloride: 105 mmol/L (ref 98–111)
Creatinine: 1.13 mg/dL (ref 0.61–1.24)
GFR, Est AFR Am: >60 mL/min (ref >60)
GFR, Estimated: >60 mL/min (ref >60)
Glucose, Bld: 87 mg/dL (ref 70–99)
Potassium: 4.5 mmol/L (ref 3.5–5.1)
Sodium: 138 mmol/L (ref 135–145)
Total Bilirubin: 0.3 mg/dL (ref 0.3–1.2)
Total Protein: 6.8 g/dL (ref 6.5–8.1)

## 2019-01-14 LAB — CBC WITH DIFFERENTIAL (CANCER CENTER ONLY)
Abs Immature Granulocytes: 0.02 10*3/uL (ref 0.00–0.07)
Basophils Absolute: 0.1 10*3/uL (ref 0.0–0.1)
Basophils Relative: 1 %
Eosinophils Absolute: 0.7 10*3/uL — ABNORMAL HIGH (ref 0.0–0.5)
Eosinophils Relative: 9 %
HCT: 33.3 % — ABNORMAL LOW (ref 39.0–52.0)
Hemoglobin: 10.8 g/dL — ABNORMAL LOW (ref 13.0–17.0)
Immature Granulocytes: 0 %
Lymphocytes Relative: 16 %
Lymphs Abs: 1.4 10*3/uL (ref 0.7–4.0)
MCH: 32.8 pg (ref 26.0–34.0)
MCHC: 32.4 g/dL (ref 30.0–36.0)
MCV: 101.2 fL — ABNORMAL HIGH (ref 80.0–100.0)
Monocytes Absolute: 0.8 10*3/uL (ref 0.1–1.0)
Monocytes Relative: 10 %
Neutro Abs: 5.5 10*3/uL (ref 1.7–7.7)
Neutrophils Relative %: 64 %
Platelet Count: 396 10*3/uL (ref 150–400)
RBC: 3.29 MIL/uL — ABNORMAL LOW (ref 4.22–5.81)
RDW: 14.5 % (ref 11.5–15.5)
WBC Count: 8.5 10*3/uL (ref 4.0–10.5)
nRBC: 0 % (ref 0.0–0.2)

## 2019-01-14 LAB — LACTATE DEHYDROGENASE: LDH: 191 U/L (ref 98–192)

## 2019-01-14 MED ORDER — SODIUM CHLORIDE 0.9 % IV SOLN
Freq: Once | INTRAVENOUS | Status: AC
  Filled 2019-01-14: qty 250

## 2019-01-14 MED ORDER — PROCHLORPERAZINE MALEATE 10 MG PO TABS
10.0000 mg | ORAL_TABLET | Freq: Once | ORAL | Status: AC
  Administered 2019-01-14: 10 mg via ORAL

## 2019-01-14 MED ORDER — PEGFILGRASTIM 6 MG/0.6ML ~~LOC~~ PSKT
6.0000 mg | PREFILLED_SYRINGE | Freq: Once | SUBCUTANEOUS | Status: AC
  Administered 2019-01-14: 6 mg via SUBCUTANEOUS

## 2019-01-14 MED ORDER — SODIUM CHLORIDE 0.9% FLUSH
10.0000 mL | INTRAVENOUS | Status: DC | PRN
  Administered 2019-01-14: 10 mL
  Filled 2019-01-14: qty 10

## 2019-01-14 MED ORDER — DEXAMETHASONE SODIUM PHOSPHATE 10 MG/ML IJ SOLN
10.0000 mg | Freq: Once | INTRAMUSCULAR | Status: AC
  Administered 2019-01-14: 10 mg via INTRAVENOUS

## 2019-01-14 MED ORDER — DEXAMETHASONE SODIUM PHOSPHATE 10 MG/ML IJ SOLN
INTRAMUSCULAR | Status: AC
  Filled 2019-01-14: qty 1

## 2019-01-14 MED ORDER — SODIUM CHLORIDE 0.9 % IV SOLN
80.0000 mg/m2 | Freq: Once | INTRAVENOUS | Status: AC
  Administered 2019-01-14: 150 mg via INTRAVENOUS
  Filled 2019-01-14: qty 15

## 2019-01-14 MED ORDER — SODIUM CHLORIDE 0.9 % IV SOLN
Freq: Once | INTRAVENOUS | Status: DC
  Filled 2019-01-14: qty 250

## 2019-01-14 MED ORDER — SODIUM CHLORIDE 0.9 % IV SOLN
200.0000 mg | Freq: Once | INTRAVENOUS | Status: AC
  Administered 2019-01-14: 200 mg via INTRAVENOUS
  Filled 2019-01-14: qty 8

## 2019-01-14 MED ORDER — PEGFILGRASTIM 6 MG/0.6ML ~~LOC~~ PSKT
PREFILLED_SYRINGE | SUBCUTANEOUS | Status: AC
  Filled 2019-01-14: qty 0.6

## 2019-01-14 MED ORDER — PROCHLORPERAZINE MALEATE 10 MG PO TABS
ORAL_TABLET | ORAL | Status: AC
  Filled 2019-01-14: qty 1

## 2019-01-14 MED ORDER — HEPARIN SOD (PORK) LOCK FLUSH 100 UNIT/ML IV SOLN
500.0000 [IU] | Freq: Once | INTRAVENOUS | Status: AC | PRN
  Administered 2019-01-14: 500 [IU]
  Filled 2019-01-14: qty 5

## 2019-01-14 MED FILL — SOLIFENACIN SUCCINATE 10 MG: 10 | 30 days supply | Qty: 30 | Fill #3

## 2019-01-14 NOTE — Progress Notes (Signed)
Hematology and Oncology Follow Up Visit  [name] [id number] 01-Aug-1945 73 y.o. 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   Current Therapy:    Zytiga 1000 mg by mouth daily - discontinued on 08/15/2016  Xtandi 160 mg po q day - start on 08/15/2016 - discontinued  Xgeva 120 mg subcutaneous Q3 months - due in Oct 2020  Lupron 22 mg IM every 3 months - due in Oct 2020  Radium-223 therapy -- s/p cycle #6  Palliative radiation to the right sacrum  Lutathera - s/p cycle 4/4 --last dose on 07/11/2017  Carbo/VP-16/Tecentriq --  S/p cycle #4  Keytruda q 6 week -- Maintanence -- s/p cycle #3 - changed from 3 to 6 week on 06/30/2018  Taxotere/Keytruda -- start on 09/17/2018 -- s/p cycle #5     Interim History:  [name] is back for follow-up.  So far, he has been doing quite well.  He really has tolerated the Taxotere/Keytruda combination quite nicely.  He had a very nice Christmas.  Is looking forward to a quiet New Year's.  We did go ahead and do a dotatate PET scan on him.  This was done on 12/29/2018.  The PET scan showed continued decreased activity and his pulmonary nodules.  There is no evidence of metastatic disease in the abdomen or pelvis.  There is some consolidation of the left lower lobe that was felt to be consistent with pneumonia.  He is totally asymptomatic.  He has had no cough.  There is no chest wall pain.  He has had no fever.  He has had no hemoptysis.  He has had constipation.  He is taking Senokot.  I told him to try MiraLAX.  There is been no problems with his right leg.  There is still some weakness.  He does have a brace on it.  His appetite has been good.  Overall, his performance status is ECOG 1.   Overall, his performance status is ECOG 1.       Medications:  Current Outpatient Medications:  .  acetaminophen (TYLENOL) 500 MG  tablet, Take 1,000 mg by mouth every 4 (four) hours as needed for moderate pain., Disp: , Rfl:  .  aspirin 81 MG tablet, Take 81 mg by mouth daily., Disp: , Rfl:  .  CALCIUM-IRON-VIT D-VIT K PO, Take 1 tablet by mouth 2 (two) times daily., Disp: , Rfl:  .  dexamethasone (DECADRON) 4 MG tablet, Take 8 mg by mouth 2 (two) times daily with a meal. , Disp: , Rfl:  .  docusate sodium (COLACE) 100 MG capsule, Take 200 mg by mouth 2 (two) times daily., Disp: , Rfl:  .  gabapentin (NEURONTIN) 300 MG capsule, TAKE 1 CAPSULE BY MOUTH TWICE A DAY THEN TAKE 2 CAPSULES AT BEDTIME (Patient taking differently: Take 300 mg by mouth 2 (two) times daily. ), Disp: 120 capsule, Rfl: 4 .  isosorbide mononitrate (IMDUR) 30 MG 24 hr tablet, TAKE 1 TABLET (30 MG TOTAL) BY MOUTH DAILY., Disp: 90 tablet, Rfl: 1 .  nitroGLYCERIN (NITROSTAT) 0.4 MG SL tablet, Place 1 tablet (0.4 mg total) under the tongue every 5 (five) minutes as needed for chest pain. (Patient not taking: Reported on 11/19/2018), Disp: 25 tablet, Rfl: 11 .  ondansetron (ZOFRAN) 8 MG tablet, Take 1 tablet (8 mg total) by mouth 2 (two) times daily as needed for nausea or vomiting., Disp: 20  tablet, Rfl: 0 .  OVER THE COUNTER MEDICATION, every morning. Juice Plus -- 8 capsule of each the garden, vineyard, and orchard twice a day., Disp: , Rfl:  .  pantoprazole (PROTONIX) 40 MG tablet, Take 1 tablet (40 mg total) by mouth at bedtime., Disp: 30 tablet, Rfl: 6 .  prochlorperazine (COMPAZINE) 10 MG tablet, Take 10 mg by mouth every 6 (six) hours as needed (Nausea or vomiting)., Disp: , Rfl:  .  solifenacin (VESICARE) 10 MG tablet, Take 1 tablet (10 mg total) by mouth daily., Disp: 30 tablet, Rfl: 5 .  sucralfate (CARAFATE) 1 GM/10ML suspension, Take 10 mLs (1 g total) by mouth 4 (four) times daily -  with meals and at bedtime., Disp: 420 mL, Rfl: 5 .  ZETIA 10 MG tablet, Take 10 mg by mouth daily. , Disp: , Rfl:  No current facility-administered medications for this  visit.  Facility-Administered Medications Ordered in Other Visits:  .  octreotide (SANDOSTATIN LAR) IM injection 30 mg, 30 mg, Intramuscular, Once, Crystalina Stodghill, Rudell Cobb, MD  Allergies:  Allergies  Allergen Reactions  . Codeine Nausea And Vomiting  . Hydrocodone Nausea And Vomiting    Past Medical History, Surgical history, Social history, and Family History were reviewed and updated.  Review of Systems: Review of Systems  Constitutional: Negative.   HENT: Negative.   Eyes: Negative.   Respiratory: Negative.   Cardiovascular: Negative.   Gastrointestinal: Negative.   Genitourinary: Negative.  Negative for urgency.  Musculoskeletal: Positive for joint pain.  Skin: Negative.   Neurological: Positive for focal weakness.  Endo/Heme/Allergies: Negative.   Psychiatric/Behavioral: Negative.   All other systems reviewed and are negative.    Physical Exam:  weight is 157 lb 1.9 oz (71.3 kg). His temporal temperature is 97.8 F (36.6 C). His blood pressure is 135/77 and his pulse is 96. His respiration is 16 and oxygen saturation is 100%. PET scan  Physical Exam Vitals reviewed.  HENT:     Head: Normocephalic and atraumatic.  Eyes:     Pupils: Pupils are equal, round, and reactive to light.  Cardiovascular:     Rate and Rhythm: Normal rate and regular rhythm.     Heart sounds: Normal heart sounds.  Pulmonary:     Effort: Pulmonary effort is normal.     Breath sounds: Normal breath sounds.  Abdominal:     General: Bowel sounds are normal.     Palpations: Abdomen is soft.  Musculoskeletal:        General: No tenderness or deformity. Normal range of motion.     Cervical back: Normal range of motion.  Lymphadenopathy:     Cervical: No cervical adenopathy.  Skin:    General: Skin is warm and dry.     Findings: No erythema or rash.  Neurological:     Mental Status: He is alert and oriented to person, place, and time.  Psychiatric:        Behavior: Behavior normal.         Thought Content: Thought content normal.        Judgment: Judgment normal.      Lab Results  Component Value Date   WBC 8.5 01/14/2019   HGB 10.8 (L) 01/14/2019   HCT 33.3 (L) 01/14/2019   MCV 101.2 (H) 01/14/2019   PLT 396 01/14/2019     Chemistry      Component Value Date/Time   NA 138 01/14/2019 0817   NA 144 12/14/2016 1159   NA 140 10/22/2016  1129   K 4.5 01/14/2019 0817   K 3.5 12/14/2016 1159   K 4.3 10/22/2016 1129   CL 105 01/14/2019 0817   CL 103 12/14/2016 1159   CO2 25 01/14/2019 0817   CO2 30 12/14/2016 1159   CO2 27 10/22/2016 1129   BUN 26 (H) 01/14/2019 0817   BUN 16 12/14/2016 1159   BUN 14.0 10/22/2016 1129   CREATININE 1.13 01/14/2019 0817   CREATININE 1.3 (H) 12/14/2016 1159   CREATININE 1.1 10/22/2016 1129      Component Value Date/Time   CALCIUM 9.7 01/14/2019 0817   CALCIUM 10.0 12/14/2016 1159   CALCIUM 10.4 10/22/2016 1129   ALKPHOS 68 01/14/2019 0817   ALKPHOS 48 12/14/2016 1159   ALKPHOS 60 10/22/2016 1129   AST 12 (L) 01/14/2019 0817   AST 16 10/22/2016 1129   ALT 9 01/14/2019 0817   ALT 23 12/14/2016 1159   ALT 14 10/22/2016 1129   BILITOT 0.3 01/14/2019 0817   BILITOT 0.31 10/22/2016 1129      Impression and Plan: [name] is 72 year old gentleman with 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  We will go ahead with a fifth cycle of treatment.    I am just glad that his quality life is doing so well.  He really is able to enjoy his family.  He is active.  As always, we talked about the [employer] rivalry.  They play basketball tonight.  We will plan to get her back to see [place] in another 3 weeks.   [name], MD 12/30/20209:03 AM

## 2019-01-14 NOTE — Patient Instructions (Signed)
Dexamethasone injection What is this medicine? DEXAMETHASONE (dex a METH a sone) is a corticosteroid. It is used to treat inflammation of the skin, joints, lungs, and other organs. Common conditions treated include asthma, allergies, and arthritis. It is also used for other conditions, like blood disorders and diseases of the adrenal glands. This medicine may be used for other purposes; ask your health care provider or pharmacist if you have questions. COMMON BRAND NAME(S): Decadron, DoubleDex, Simplist Dexamethasone, Solurex What should I tell my health care provider before I take this medicine? They need to know if you have any of these conditions:  blood clotting problems  Cushing's syndrome  diabetes  glaucoma  heart problems or disease  high blood pressure  infection like herpes, measles, tuberculosis, or chickenpox  kidney disease  liver disease  mental problems  myasthenia gravis  osteoporosis  previous heart attack  seizures  stomach, ulcer or intestine disease including colitis and diverticulitis  thyroid problem  an unusual or allergic reaction to dexamethasone, corticosteroids, other medicines, lactose, foods, dyes, or preservatives  pregnant or trying to get pregnant  breast-feeding How should I use this medicine? This medicine is for injection into a muscle, joint, lesion, soft tissue, or vein. It is given by a health care professional in a hospital or clinic setting. Talk to your pediatrician regarding the use of this medicine in children. Special care may be needed. Overdosage: If you think you have taken too much of this medicine contact a poison control center or emergency room at once. NOTE: This medicine is only for you. Do not share this medicine with others. What if I miss a dose? This may not apply. If you are having a series of injections over a prolonged period, try not to miss an appointment. Call your doctor or health care professional to  reschedule if you are unable to keep an appointment. What may interact with this medicine? Do not take this medicine with any of the following medications:  mifepristone, RU-486  vaccines This medicine may also interact with the following medications:  amphotericin B  antibiotics like clarithromycin, erythromycin, and troleandomycin  aspirin and aspirin-like drugs  barbiturates like phenobarbital  carbamazepine  cholestyramine  cholinesterase inhibitors like donepezil, galantamine, rivastigmine, and tacrine  cyclosporine  digoxin  diuretics  ephedrine  male hormones, like estrogens or progestins and birth control pills  indinavir  isoniazid  ketoconazole  medicines for diabetes  medicines that improve muscle tone or strength for conditions like myasthenia gravis  NSAIDs, medicines for pain and inflammation, like ibuprofen or naproxen  phenytoin  rifampin  thalidomide  warfarin This list may not describe all possible interactions. Give your health care provider a list of all the medicines, herbs, non-prescription drugs, or dietary supplements you use. Also tell them if you smoke, drink alcohol, or use illegal drugs. Some items may interact with your medicine. What should I watch for while using this medicine? Your condition will be monitored carefully while you are receiving this medicine. If you are taking this medicine for a long time, carry an identification card with your name and address, the type and dose of your medicine, and your doctor's name and address. This medicine may increase your risk of getting an infection. Stay away from people who are sick. Tell your doctor or health care professional if you are around anyone with measles or chickenpox. Talk to your health care provider before you get any vaccines that you take this medicine. If you are going to  have surgery, tell your doctor or health care professional that you have taken this medicine  within the last twelve months. Ask your doctor or health care professional about your diet. You may need to lower the amount of salt you eat. This medicine may increase blood sugar. Ask your healthcare provider if changes in diet or medicines are needed if you have diabetes. What side effects may I notice from receiving this medicine? Side effects that you should report to your doctor or health care professional as soon as possible:  allergic reactions like skin rash, itching or hives, swelling of the face, lips, or tongue  black or tarry stools  change in the amount of urine  confusion, excitement, restlessness, a false sense of well-being  fever, sore throat, sneezing, cough, or other signs of infection, wounds that will not heal  hallucinations  mental depression, mood swings, mistaken feelings of self importance or of being mistreated  pain in hips, back, ribs, arms, shoulders, or legs  pain, redness, or irritation at the injection site  redness, blistering, peeling or loosening of the skin, including inside the mouth  rounding out of face   signs and symptoms of high blood sugar such as being more thirsty or hungry or having to urinate more than normal. You may also feel very tired or have blurry vision.  swelling of feet or lower legs  unusual bleeding or bruising  wounds that do not heal Side effects that usually do not require medical attention (report to your doctor or health care professional if they continue or are bothersome):  diarrhea or constipation  change in taste  headache  nausea, vomiting  skin problems, acne, thin and shiny skin  trouble sleeping  unusual growth of hair on the face or body  weight gain This list may not describe all possible side effects. Call your doctor for medical advice about side effects. You may report side effects to FDA at 1-800-FDA-1088. Where should I keep my medicine? This drug is given in a hospital or clinic and  will not be stored at home. NOTE: This sheet is a summary. It may not cover all possible information. If you have questions about this medicine, talk to your doctor, pharmacist, or health care provider.  2020 Elsevier/Gold Standard (2017-10-02 10:35:53) Prochlorperazine tablets What is this medicine? PROCHLORPERAZINE (proe klor PER a zeen) helps to control severe nausea and vomiting. This medicine is also used to treat schizophrenia. It can also help patients who experience anxiety that is not due to psychological illness. This medicine may be used for other purposes; ask your health care provider or pharmacist if you have questions. COMMON BRAND NAME(S): Compazine What should I tell my health care provider before I take this medicine? They need to know if you have any of these conditions:  blood disorders or disease  dementia  diabetes  liver disease or jaundice  Parkinson's disease  uncontrollable movement disorder  an unusual or allergic reaction to prochlorperazine, other medicines, foods, dyes, or preservatives  pregnant or trying to get pregnant  breast-feeding How should I use this medicine? Take this medicine by mouth with a glass of water. Follow the directions on the prescription label. Take your doses at regular intervals. Do not take your medicine more often than directed. Do not stop taking this medicine suddenly. This can cause nausea, vomiting, and dizziness. Ask your doctor or health care professional for advice. Talk to your pediatrician regarding the use of this medicine in children. Special  care may be needed. While this drug may be prescribed for children as young as 2 years for selected conditions, precautions do apply. Overdosage: If you think you have taken too much of this medicine contact a poison control center or emergency room at once. NOTE: This medicine is only for you. Do not share this medicine with others. What if I miss a dose? If you miss a dose,  take it as soon as you can. If it is almost time for your next dose, take only that dose. Do not take double or extra doses. What may interact with this medicine? Do not take this medicine with any of the following medications:  amoxapine  antidepressants like citalopram, escitalopram, fluoxetine, paroxetine, and sertraline  deferoxamine  maprotiline  tricyclic antidepressants like amitriptyline, clomipramine, imipramine, nortiptyline and others This medicine may also interact with the following medications:  dofetilide  lithium  medicines for pain  phenytoin  propranolol  warfarin This list may not describe all possible interactions. Give your health care provider a list of all the medicines, herbs, non-prescription drugs, or dietary supplements you use. Also tell them if you smoke, drink alcohol, or use illegal drugs. Some items may interact with your medicine. What should I watch for while using this medicine? Visit your doctor or health care professional for regular checks on your progress. You may get drowsy or dizzy. Do not drive, use machinery, or do anything that needs mental alertness until you know how this medicine affects you. Do not stand or sit up quickly, especially if you are an older patient. This reduces the risk of dizzy or fainting spells. Alcohol may interfere with the effect of this medicine. Avoid alcoholic drinks. This medicine can reduce the response of your body to heat or cold. Dress warm in cold weather and stay hydrated in hot weather. If possible, avoid extreme temperatures like saunas, hot tubs, very hot or cold showers, or activities that can cause dehydration such as vigorous exercise. This medicine may increase blood sugar. Ask your healthcare provider if changes in diet or medicines are needed if you have diabetes. This medicine can make you more sensitive to the sun. Keep out of the sun. If you cannot avoid being in the sun, wear protective clothing  and use sunscreen. Do not use sun lamps or tanning beds/booths. Your mouth may get dry. Chewing sugarless gum or sucking hard candy, and drinking plenty of water may help. Contact your doctor if the problem does not go away or is severe. What side effects may I notice from receiving this medicine? Side effects that you should report to your doctor or health care professional as soon as possible:  blurred vision  breast enlargement in men or women  breast milk in women who are not breast-feeding  chest pain, fast or irregular heartbeat  confusion, restlessness  dark yellow or brown urine  difficulty breathing or swallowing  dizziness or fainting spells  drooling, shaking, movement difficulty (shuffling walk) or rigidity  fever, chills, sore throat  involuntary or uncontrollable movements of the eyes, mouth, head, arms, and legs  seizures   signs and symptoms of high blood sugar such as being more thirsty or hungry or having to urinate more than normal. You may also feel very tired or have blurry vision.  stomach area pain  unusually weak or tired  unusual bleeding or bruising  yellowing of skin or eyes Side effects that usually do not require medical attention (report to your doctor or  health care professional if they continue or are bothersome):  difficulty passing urine  difficulty sleeping  headache  sexual dysfunction  skin rash, or itching This list may not describe all possible side effects. Call your doctor for medical advice about side effects. You may report side effects to FDA at 1-800-FDA-1088. Where should I keep my medicine? Keep out of the reach of children. Store at room temperature between 15 and 30 degrees C (59 and 86 degrees F). Protect from light. Throw away any unused medicine after the expiration date. NOTE: This sheet is a summary. It may not cover all possible information. If you have questions about this medicine, talk to your doctor,  pharmacist, or health care provider.  2020 Elsevier/Gold Standard (2017-12-24 08:37:35) Pembrolizumab injection What is this medicine? PEMBROLIZUMAB (pem broe liz ue mab) is a monoclonal antibody. It is used to treat bladder cancer, cervical cancer, endometrial cancer, esophageal cancer, head and neck cancer, hepatocellular cancer, Hodgkin lymphoma, kidney cancer, lymphoma, melanoma, Merkel cell carcinoma, lung cancer, stomach cancer, urothelial cancer, and cancers that have a certain genetic condition. This medicine may be used for other purposes; ask your health care provider or pharmacist if you have questions. COMMON BRAND NAME(S): Keytruda What should I tell my health care provider before I take this medicine? They need to know if you have any of these conditions:  diabetes  immune system problems  inflammatory bowel disease  liver disease  lung or breathing disease  lupus  received or scheduled to receive an organ transplant or a stem-cell transplant that uses donor stem cells  an unusual or allergic reaction to pembrolizumab, other medicines, foods, dyes, or preservatives  pregnant or trying to get pregnant  breast-feeding How should I use this medicine? This medicine is for infusion into a vein. It is given by a health care professional in a hospital or clinic setting. A special MedGuide will be given to you before each treatment. Be sure to read this information carefully each time. Talk to your pediatrician regarding the use of this medicine in children. While this drug may be prescribed for selected conditions, precautions do apply. Overdosage: If you think you have taken too much of this medicine contact a poison control center or emergency room at once. NOTE: This medicine is only for you. Do not share this medicine with others. What if I miss a dose? It is important not to miss your dose. Call your doctor or health care professional if you are unable to keep an  appointment. What may interact with this medicine? Interactions have not been studied. Give your health care provider a list of all the medicines, herbs, non-prescription drugs, or dietary supplements you use. Also tell them if you smoke, drink alcohol, or use illegal drugs. Some items may interact with your medicine. This list may not describe all possible interactions. Give your health care provider a list of all the medicines, herbs, non-prescription drugs, or dietary supplements you use. Also tell them if you smoke, drink alcohol, or use illegal drugs. Some items may interact with your medicine. What should I watch for while using this medicine? Your condition will be monitored carefully while you are receiving this medicine. You may need blood work done while you are taking this medicine. Do not become pregnant while taking this medicine or for 4 months after stopping it. Women should inform their doctor if they wish to become pregnant or think they might be pregnant. There is a potential for serious side  effects to an unborn child. Talk to your health care professional or pharmacist for more information. Do not breast-feed an infant while taking this medicine or for 4 months after the last dose. What side effects may I notice from receiving this medicine? Side effects that you should report to your doctor or health care professional as soon as possible:  allergic reactions like skin rash, itching or hives, swelling of the face, lips, or tongue  bloody or black, tarry  breathing problems  changes in vision  chest pain  chills  confusion  constipation  cough  diarrhea  dizziness or feeling faint or lightheaded  fast or irregular heartbeat  fever  flushing  hair loss  joint pain  low blood counts - this medicine may decrease the number of white blood cells, red blood cells and platelets. You may be at increased risk for infections and bleeding.  muscle pain  muscle  weakness  persistent headache  redness, blistering, peeling or loosening of the skin, including inside the mouth  signs and symptoms of high blood sugar such as dizziness; dry mouth; dry skin; fruity breath; nausea; stomach pain; increased hunger or thirst; increased urination  signs and symptoms of kidney injury like trouble passing urine or change in the amount of urine  signs and symptoms of liver injury like dark urine, light-colored stools, loss of appetite, nausea, right upper belly pain, yellowing of the eyes or skin  sweating  swollen lymph nodes  weight loss Side effects that usually do not require medical attention (report to your doctor or health care professional if they continue or are bothersome):  decreased appetite  muscle pain  tiredness This list may not describe all possible side effects. Call your doctor for medical advice about side effects. You may report side effects to FDA at 1-800-FDA-1088. Where should I keep my medicine? This drug is given in a hospital or clinic and will not be stored at home. NOTE: This sheet is a summary. It may not cover all possible information. If you have questions about this medicine, talk to your doctor, pharmacist, or health care provider.  2020 Elsevier/Gold Standard (2018-01-28 13:46:58) Docetaxel injection What is this medicine? DOCETAXEL (doe se TAX el) is a chemotherapy drug. It targets fast dividing cells, like cancer cells, and causes these cells to die. This medicine is used to treat many types of cancers like breast cancer, certain stomach cancers, head and neck cancer, lung cancer, and prostate cancer. This medicine may be used for other purposes; ask your health care provider or pharmacist if you have questions. COMMON BRAND NAME(S): Docefrez, Taxotere What should I tell my health care provider before I take this medicine? They need to know if you have any of these conditions:  infection (especially a virus  infection such as chickenpox, cold sores, or herpes)  liver disease  low blood counts, like low white cell, platelet, or red cell counts  an unusual or allergic reaction to docetaxel, polysorbate 80, other chemotherapy agents, other medicines, foods, dyes, or preservatives  pregnant or trying to get pregnant  breast-feeding How should I use this medicine? This drug is given as an infusion into a vein. It is administered in a hospital or clinic by a specially trained health care professional. Talk to your pediatrician regarding the use of this medicine in children. Special care may be needed. Overdosage: If you think you have taken too much of this medicine contact a poison control center or emergency room at once.  NOTE: This medicine is only for you. Do not share this medicine with others. What if I miss a dose? It is important not to miss your dose. Call your doctor or health care professional if you are unable to keep an appointment. What may interact with this medicine?  aprepitant  certain antibiotics like erythromycin or clarithromycin  certain antivirals for HIV or hepatitis  certain medicines for fungal infections like fluconazole, itraconazole, ketoconazole, posaconazole, or voriconazole  cimetidine  ciprofloxacin  conivaptan  cyclosporine  dronedarone  fluvoxamine  grapefruit juice  imatinib  verapamil This list may not describe all possible interactions. Give your health care provider a list of all the medicines, herbs, non-prescription drugs, or dietary supplements you use. Also tell them if you smoke, drink alcohol, or use illegal drugs. Some items may interact with your medicine. What should I watch for while using this medicine? Your condition will be monitored carefully while you are receiving this medicine. You will need important blood work done while you are taking this medicine. Call your doctor or health care professional for advice if you get a  fever, chills or sore throat, or other symptoms of a cold or flu. Do not treat yourself. This drug decreases your body's ability to fight infections. Try to avoid being around people who are sick. Some products may contain alcohol. Ask your health care professional if this medicine contains alcohol. Be sure to tell all health care professionals you are taking this medicine. Certain medicines, like metronidazole and disulfiram, can cause an unpleasant reaction when taken with alcohol. The reaction includes flushing, headache, nausea, vomiting, sweating, and increased thirst. The reaction can last from 30 minutes to several hours. You may get drowsy or dizzy. Do not drive, use machinery, or do anything that needs mental alertness until you know how this medicine affects you. Do not stand or sit up quickly, especially if you are an older patient. This reduces the risk of dizzy or fainting spells. Alcohol may interfere with the effect of this medicine. Talk to your health care professional about your risk of cancer. You may be more at risk for certain types of cancer if you take this medicine. Do not become pregnant while taking this medicine or for 6 months after stopping it. Women should inform their doctor if they wish to become pregnant or think they might be pregnant. There is a potential for serious side effects to an unborn child. Talk to your health care professional or pharmacist for more information. Do not breast-feed an infant while taking this medicine or for 1 to 2 weeks after stopping it. Males who get this medicine must use a condom during sex with females who can get pregnant. If you get a woman pregnant, the baby could have birth defects. The baby could die before they are born. You will need to continue wearing a condom for 3 months after stopping the medicine. Tell your health care provider right away if your partner becomes pregnant while you are taking this medicine. This may interfere with  the ability to father a child. You should talk to your doctor or health care professional if you are concerned about your fertility. What side effects may I notice from receiving this medicine? Side effects that you should report to your doctor or health care professional as soon as possible:  allergic reactions like skin rash, itching or hives, swelling of the face, lips, or tongue  blurred vision  breathing problems  changes in vision  low blood counts - This drug may decrease the number of white blood cells, red blood cells and platelets. You may be at increased risk for infections and bleeding.  nausea and vomiting  pain, redness or irritation at site where injected  pain, tingling, numbness in the hands or feet  redness, blistering, peeling, or loosening of the skin, including inside the mouth  signs of decreased platelets or bleeding - bruising, pinpoint red spots on the skin, black, tarry stools, nosebleeds  signs of decreased red blood cells - unusually weak or tired, fainting spells, lightheadedness  signs of infection - fever or chills, cough, sore throat, pain or difficulty passing urine  swelling of the ankle, feet, hands Side effects that usually do not require medical attention (report to your doctor or health care professional if they continue or are bothersome):  constipation  diarrhea  fingernail or toenail changes  hair loss  loss of appetite  mouth sores  muscle pain This list may not describe all possible side effects. Call your doctor for medical advice about side effects. You may report side effects to FDA at 1-800-FDA-1088. Where should I keep my medicine? This drug is given in a hospital or clinic and will not be stored at home. NOTE: This sheet is a summary. It may not cover all possible information. If you have questions about this medicine, talk to your doctor, pharmacist, or health care provider.  2020 Elsevier/Gold Standard (2018-03-07  12:23:11)

## 2019-02-04 ENCOUNTER — Other Ambulatory Visit: Payer: Self-pay

## 2019-02-04 ENCOUNTER — Inpatient Hospital Stay: Payer: Medicare Other

## 2019-02-04 ENCOUNTER — Telehealth: Payer: Self-pay | Admitting: Hematology & Oncology

## 2019-02-04 ENCOUNTER — Inpatient Hospital Stay: Payer: Medicare Other | Attending: Hematology & Oncology | Admitting: Hematology & Oncology

## 2019-02-04 ENCOUNTER — Encounter: Payer: Self-pay | Admitting: Hematology & Oncology

## 2019-02-04 VITALS — BP 138/73 | HR 89 | Temp 96.9°F | Resp 18 | Wt 154.0 lb

## 2019-02-04 DIAGNOSIS — C61 Malignant neoplasm of prostate: Secondary | ICD-10-CM | POA: Insufficient documentation

## 2019-02-04 DIAGNOSIS — Z79899 Other long term (current) drug therapy: Secondary | ICD-10-CM | POA: Insufficient documentation

## 2019-02-04 DIAGNOSIS — K909 Intestinal malabsorption, unspecified: Secondary | ICD-10-CM | POA: Diagnosis not present

## 2019-02-04 DIAGNOSIS — Z5111 Encounter for antineoplastic chemotherapy: Secondary | ICD-10-CM | POA: Insufficient documentation

## 2019-02-04 DIAGNOSIS — C7B8 Other secondary neuroendocrine tumors: Secondary | ICD-10-CM | POA: Insufficient documentation

## 2019-02-04 DIAGNOSIS — C7A8 Other malignant neuroendocrine tumors: Secondary | ICD-10-CM | POA: Diagnosis not present

## 2019-02-04 DIAGNOSIS — Z5112 Encounter for antineoplastic immunotherapy: Secondary | ICD-10-CM | POA: Insufficient documentation

## 2019-02-04 DIAGNOSIS — D500 Iron deficiency anemia secondary to blood loss (chronic): Secondary | ICD-10-CM | POA: Diagnosis not present

## 2019-02-04 DIAGNOSIS — D509 Iron deficiency anemia, unspecified: Secondary | ICD-10-CM | POA: Diagnosis not present

## 2019-02-04 DIAGNOSIS — R609 Edema, unspecified: Secondary | ICD-10-CM | POA: Insufficient documentation

## 2019-02-04 DIAGNOSIS — C7951 Secondary malignant neoplasm of bone: Secondary | ICD-10-CM

## 2019-02-04 DIAGNOSIS — M2550 Pain in unspecified joint: Secondary | ICD-10-CM | POA: Insufficient documentation

## 2019-02-04 DIAGNOSIS — R531 Weakness: Secondary | ICD-10-CM | POA: Diagnosis not present

## 2019-02-04 HISTORY — DX: Iron deficiency anemia secondary to blood loss (chronic): D50.0

## 2019-02-04 LAB — CMP (CANCER CENTER ONLY)
ALT: 7 U/L (ref 0–44)
AST: 12 U/L — ABNORMAL LOW (ref 15–41)
Albumin: 4.3 g/dL (ref 3.5–5.0)
Alkaline Phosphatase: 58 U/L (ref 38–126)
Anion gap: 9 (ref 5–15)
BUN: 18 mg/dL (ref 8–23)
CO2: 25 mmol/L (ref 22–32)
Calcium: 9.7 mg/dL (ref 8.9–10.3)
Chloride: 104 mmol/L (ref 98–111)
Creatinine: 1.08 mg/dL (ref 0.61–1.24)
GFR, Est AFR Am: >60 mL/min (ref >60)
GFR, Estimated: >60 mL/min (ref >60)
Glucose, Bld: 111 mg/dL — ABNORMAL HIGH (ref 70–99)
Potassium: 4.3 mmol/L (ref 3.5–5.1)
Sodium: 138 mmol/L (ref 135–145)
Total Bilirubin: 0.3 mg/dL (ref 0.3–1.2)
Total Protein: 6.9 g/dL (ref 6.5–8.1)

## 2019-02-04 LAB — CBC WITH DIFFERENTIAL (CANCER CENTER ONLY)
Abs Immature Granulocytes: 0.03 10*3/uL (ref 0.00–0.07)
Basophils Absolute: 0.1 10*3/uL (ref 0.0–0.1)
Basophils Relative: 1 %
Eosinophils Absolute: 0.1 10*3/uL (ref 0.0–0.5)
Eosinophils Relative: 1 %
HCT: 34 % — ABNORMAL LOW (ref 39.0–52.0)
Hemoglobin: 10.9 g/dL — ABNORMAL LOW (ref 13.0–17.0)
Immature Granulocytes: 0 %
Lymphocytes Relative: 11 %
Lymphs Abs: 1 10*3/uL (ref 0.7–4.0)
MCH: 32.1 pg (ref 26.0–34.0)
MCHC: 32.1 g/dL (ref 30.0–36.0)
MCV: 100 fL (ref 80.0–100.0)
Monocytes Absolute: 0.7 10*3/uL (ref 0.1–1.0)
Monocytes Relative: 8 %
Neutro Abs: 7.4 10*3/uL (ref 1.7–7.7)
Neutrophils Relative %: 79 %
Platelet Count: 331 10*3/uL (ref 150–400)
RBC: 3.4 MIL/uL — ABNORMAL LOW (ref 4.22–5.81)
RDW: 14.8 % (ref 11.5–15.5)
WBC Count: 9.4 10*3/uL (ref 4.0–10.5)
nRBC: 0 % (ref 0.0–0.2)

## 2019-02-04 LAB — IRON AND TIBC
Iron: 52 ug/dL (ref 42–163)
Saturation Ratios: 17 % — ABNORMAL LOW (ref 20–55)
TIBC: 313 ug/dL (ref 202–409)
UIBC: 260 ug/dL (ref 117–376)

## 2019-02-04 LAB — FERRITIN: Ferritin: 76 ng/mL (ref 24–336)

## 2019-02-04 LAB — LACTATE DEHYDROGENASE: LDH: 177 U/L (ref 98–192)

## 2019-02-04 MED ORDER — PEGFILGRASTIM 6 MG/0.6ML ~~LOC~~ PSKT
PREFILLED_SYRINGE | SUBCUTANEOUS | Status: AC
  Filled 2019-02-04: qty 0.6

## 2019-02-04 MED ORDER — SODIUM CHLORIDE 0.9 % IV SOLN
Freq: Once | INTRAVENOUS | Status: AC
  Filled 2019-02-04: qty 250

## 2019-02-04 MED ORDER — PROCHLORPERAZINE MALEATE 10 MG PO TABS: 10.0000 mg | ORAL_TABLET | Freq: Once | ORAL | Status: DC

## 2019-02-04 MED ORDER — HEPARIN SOD (PORK) LOCK FLUSH 100 UNIT/ML IV SOLN
500.0000 [IU] | Freq: Once | INTRAVENOUS | Status: AC | PRN
  Administered 2019-02-04: 500 [IU]
  Filled 2019-02-04: qty 5

## 2019-02-04 MED ORDER — LEUPROLIDE ACETATE (3 MONTH) 22.5 MG ~~LOC~~ KIT
22.5000 mg | PACK | Freq: Once | SUBCUTANEOUS | Status: AC
  Administered 2019-02-04: 22.5 mg via SUBCUTANEOUS
  Filled 2019-02-04: qty 22.5

## 2019-02-04 MED ORDER — SODIUM CHLORIDE 0.9 % IV SOLN
80.0000 mg/m2 | Freq: Once | INTRAVENOUS | Status: AC
  Administered 2019-02-04: 150 mg via INTRAVENOUS
  Filled 2019-02-04: qty 15

## 2019-02-04 MED ORDER — DEXAMETHASONE SODIUM PHOSPHATE 10 MG/ML IJ SOLN
INTRAMUSCULAR | Status: AC
  Filled 2019-02-04: qty 1

## 2019-02-04 MED ORDER — SODIUM CHLORIDE 0.9% FLUSH
10.0000 mL | INTRAVENOUS | Status: DC | PRN
  Administered 2019-02-04: 10 mL
  Filled 2019-02-04: qty 10

## 2019-02-04 MED ORDER — DEXAMETHASONE SODIUM PHOSPHATE 10 MG/ML IJ SOLN
10.0000 mg | Freq: Once | INTRAMUSCULAR | Status: AC
  Administered 2019-02-04: 10 mg via INTRAVENOUS

## 2019-02-04 MED ORDER — PEGFILGRASTIM 6 MG/0.6ML ~~LOC~~ PSKT
6.0000 mg | PREFILLED_SYRINGE | Freq: Once | SUBCUTANEOUS | Status: AC
  Administered 2019-02-04: 6 mg via SUBCUTANEOUS

## 2019-02-04 MED ORDER — DENOSUMAB 120 MG/1.7ML ~~LOC~~ SOLN
120.0000 mg | Freq: Once | SUBCUTANEOUS | Status: AC
  Administered 2019-02-04: 120 mg via SUBCUTANEOUS

## 2019-02-04 MED ORDER — SODIUM CHLORIDE 0.9 % IV SOLN
Freq: Once | INTRAVENOUS | Status: DC
  Filled 2019-02-04: qty 250

## 2019-02-04 MED ORDER — DENOSUMAB 120 MG/1.7ML ~~LOC~~ SOLN
SUBCUTANEOUS | Status: AC
  Filled 2019-02-04: qty 1.7

## 2019-02-04 MED ORDER — SODIUM CHLORIDE 0.9 % IV SOLN
200.0000 mg | Freq: Once | INTRAVENOUS | Status: AC
  Administered 2019-02-04: 200 mg via INTRAVENOUS
  Filled 2019-02-04: qty 8

## 2019-02-04 NOTE — Addendum Note (Signed)
Addended by: [name] on: 02/04/2019 05:23 PM   Modules accepted: Orders

## 2019-02-04 NOTE — Telephone Encounter (Signed)
Appointments were already scheduled previously / 1/20 los

## 2019-02-04 NOTE — Patient Instructions (Signed)
Denosumab injection What is this medicine? DENOSUMAB (den oh sue mab) slows bone breakdown. Prolia is used to treat osteoporosis in women after menopause and in men, and in people who are taking corticosteroids for 6 months or more. Xgeva is used to treat a high calcium level due to cancer and to prevent bone fractures and other bone problems caused by multiple myeloma or cancer bone metastases. Xgeva is also used to treat giant cell tumor of the bone. This medicine may be used for other purposes; ask your health care provider or pharmacist if you have questions. COMMON BRAND NAME(S): Prolia, XGEVA What should I tell my health care provider before I take this medicine? They need to know if you have any of these conditions:  dental disease  having surgery or tooth extraction  infection  kidney disease  low levels of calcium or Vitamin D in the blood  malnutrition  on hemodialysis  skin conditions or sensitivity  thyroid or parathyroid disease  an unusual reaction to denosumab, other medicines, foods, dyes, or preservatives  pregnant or trying to get pregnant  breast-feeding How should I use this medicine? This medicine is for injection under the skin. It is given by a health care professional in a hospital or clinic setting. A special MedGuide will be given to you before each treatment. Be sure to read this information carefully each time. For Prolia, talk to your pediatrician regarding the use of this medicine in children. Special care may be needed. For Xgeva, talk to your pediatrician regarding the use of this medicine in children. While this drug may be prescribed for children as young as 13 years for selected conditions, precautions do apply. Overdosage: If you think you have taken too much of this medicine contact a poison control center or emergency room at once. NOTE: This medicine is only for you. Do not share this medicine with others. What if I miss a dose? It is  important not to miss your dose. Call your doctor or health care professional if you are unable to keep an appointment. What may interact with this medicine? Do not take this medicine with any of the following medications:  other medicines containing denosumab This medicine may also interact with the following medications:  medicines that lower your chance of fighting infection  steroid medicines like prednisone or cortisone This list may not describe all possible interactions. Give your health care provider a list of all the medicines, herbs, non-prescription drugs, or dietary supplements you use. Also tell them if you smoke, drink alcohol, or use illegal drugs. Some items may interact with your medicine. What should I watch for while using this medicine? Visit your doctor or health care professional for regular checks on your progress. Your doctor or health care professional may order blood tests and other tests to see how you are doing. Call your doctor or health care professional for advice if you get a fever, chills or sore throat, or other symptoms of a cold or flu. Do not treat yourself. This drug may decrease your body's ability to fight infection. Try to avoid being around people who are sick. You should make sure you get enough calcium and vitamin D while you are taking this medicine, unless your doctor tells you not to. Discuss the foods you eat and the vitamins you take with your health care professional. See your dentist regularly. Brush and floss your teeth as directed. Before you have any dental work done, tell your dentist you are   receiving this medicine. Do not become pregnant while taking this medicine or for 5 months after stopping it. Talk with your doctor or health care professional about your birth control options while taking this medicine. Women should inform their doctor if they wish to become pregnant or think they might be pregnant. There is a potential for serious side  effects to an unborn child. Talk to your health care professional or pharmacist for more information. What side effects may I notice from receiving this medicine? Side effects that you should report to your doctor or health care professional as soon as possible:  allergic reactions like skin rash, itching or hives, swelling of the face, lips, or tongue  bone pain  breathing problems  dizziness  jaw pain, especially after dental work  redness, blistering, peeling of the skin  signs and symptoms of infection like fever or chills; cough; sore throat; pain or trouble passing urine  signs of low calcium like fast heartbeat, muscle cramps or muscle pain; pain, tingling, numbness in the hands or feet; seizures  unusual bleeding or bruising  unusually weak or tired Side effects that usually do not require medical attention (report to your doctor or health care professional if they continue or are bothersome):  constipation  diarrhea  headache  joint pain  loss of appetite  muscle pain  runny nose  tiredness  upset stomach This list may not describe all possible side effects. Call your doctor for medical advice about side effects. You may report side effects to FDA at 1-800-FDA-1088. Where should I keep my medicine? This medicine is only given in a clinic, doctor's office, or other health care setting and will not be stored at home. NOTE: This sheet is a summary. It may not cover all possible information. If you have questions about this medicine, talk to your doctor, pharmacist, or health care provider.  2020 Elsevier/Gold Standard (2017-05-10 16:10:44) Leuprolide depot injection What is this medicine? LEUPROLIDE (loo PROE lide) is a man-made protein that acts like a natural hormone in the body. It decreases testosterone in men and decreases estrogen in women. In men, this medicine is used to treat advanced prostate cancer. In women, some forms of this medicine may be used  to treat endometriosis, uterine fibroids, or other male hormone-related problems. This medicine may be used for other purposes; ask your health care provider or pharmacist if you have questions. COMMON BRAND NAME(S): Eligard, Fensolv, Lupron Depot, Lupron Depot-Ped, Viadur What should I tell my health care provider before I take this medicine? They need to know if you have any of these conditions:  diabetes  heart disease or previous heart attack  high blood pressure  high cholesterol  mental illness  osteoporosis  pain or difficulty passing urine  seizures  spinal cord metastasis  stroke  suicidal thoughts, plans, or attempt; a previous suicide attempt by you or a family member  tobacco smoker  unusual vaginal bleeding (women)  an unusual or allergic reaction to leuprolide, benzyl alcohol, other medicines, foods, dyes, or preservatives  pregnant or trying to get pregnant  breast-feeding How should I use this medicine? This medicine is for injection into a muscle or for injection under the skin. It is given by a health care professional in a hospital or clinic setting. The specific product will determine how it will be given to you. Make sure you understand which product you receive and how often you will receive it. Talk to your pediatrician regarding the use of   this medicine in children. Special care may be needed. Overdosage: If you think you have taken too much of this medicine contact a poison control center or emergency room at once. NOTE: This medicine is only for you. Do not share this medicine with others. What if I miss a dose? It is important not to miss a dose. Call your doctor or health care professional if you are unable to keep an appointment. Depot injections: Depot injections are given either once-monthly, every 12 weeks, every 16 weeks, or every 24 weeks depending on the product you are prescribed. The product you are prescribed will be based on if you  are male or male, and your condition. Make sure you understand your product and dosing. What may interact with this medicine? Do not take this medicine with any of the following medications:  chasteberry This medicine may also interact with the following medications:  herbal or dietary supplements, like black cohosh or DHEA  male hormones, like estrogens or progestins and birth control pills, patches, rings, or injections  male hormones, like testosterone This list may not describe all possible interactions. Give your health care provider a list of all the medicines, herbs, non-prescription drugs, or dietary supplements you use. Also tell them if you smoke, drink alcohol, or use illegal drugs. Some items may interact with your medicine. What should I watch for while using this medicine? Visit your doctor or health care professional for regular checks on your progress. During the first weeks of treatment, your symptoms may get worse, but then will improve as you continue your treatment. You may get hot flashes, increased bone pain, increased difficulty passing urine, or an aggravation of nerve symptoms. Discuss these effects with your doctor or health care professional, some of them may improve with continued use of this medicine. Male patients may experience a menstrual cycle or spotting during the first months of therapy with this medicine. If this continues, contact your doctor or health care professional. This medicine may increase blood sugar. Ask your healthcare provider if changes in diet or medicines are needed if you have diabetes. What side effects may I notice from receiving this medicine? Side effects that you should report to your doctor or health care professional as soon as possible:  allergic reactions like skin rash, itching or hives, swelling of the face, lips, or tongue  breathing problems  chest pain  depression or memory disorders  pain in your legs or  groin  pain at site where injected or implanted  seizures  severe headache  signs and symptoms of high blood sugar such as being more thirsty or hungry or having to urinate more than normal. You may also feel very tired or have blurry vision  swelling of the feet and legs  suicidal thoughts or other mood changes  visual changes  vomiting Side effects that usually do not require medical attention (report to your doctor or health care professional if they continue or are bothersome):  breast swelling or tenderness  decrease in sex drive or performance  diarrhea  hot flashes  loss of appetite  muscle, joint, or bone pains  nausea  redness or irritation at site where injected or implanted  skin problems or acne This list may not describe all possible side effects. Call your doctor for medical advice about side effects. You may report side effects to FDA at 1-800-FDA-1088. Where should I keep my medicine? This drug is given in a hospital or clinic and will not be  stored at home. NOTE: This sheet is a summary. It may not cover all possible information. If you have questions about this medicine, talk to your doctor, pharmacist, or health care provider.  2020 Elsevier/Gold Standard (2017-10-31 09:27:03) Pembrolizumab injection What is this medicine? PEMBROLIZUMAB (pem broe liz ue mab) is a monoclonal antibody. It is used to treat certain types of cancer. This medicine may be used for other purposes; ask your health care provider or pharmacist if you have questions. COMMON BRAND NAME(S): Keytruda What should I tell my health care provider before I take this medicine? They need to know if you have any of these conditions:  diabetes  immune system problems  inflammatory bowel disease  liver disease  lung or breathing disease  lupus  received or scheduled to receive an organ transplant or a stem-cell transplant that uses donor stem cells  an unusual or allergic  reaction to pembrolizumab, other medicines, foods, dyes, or preservatives  pregnant or trying to get pregnant  breast-feeding How should I use this medicine? This medicine is for infusion into a vein. It is given by a health care professional in a hospital or clinic setting. A special MedGuide will be given to you before each treatment. Be sure to read this information carefully each time. Talk to your pediatrician regarding the use of this medicine in children. While this drug may be prescribed for children as young as 6 months for selected conditions, precautions do apply. Overdosage: If you think you have taken too much of this medicine contact a poison control center or emergency room at once. NOTE: This medicine is only for you. Do not share this medicine with others. What if I miss a dose? It is important not to miss your dose. Call your doctor or health care professional if you are unable to keep an appointment. What may interact with this medicine? Interactions have not been studied. Give your health care provider a list of all the medicines, herbs, non-prescription drugs, or dietary supplements you use. Also tell them if you smoke, drink alcohol, or use illegal drugs. Some items may interact with your medicine. This list may not describe all possible interactions. Give your health care provider a list of all the medicines, herbs, non-prescription drugs, or dietary supplements you use. Also tell them if you smoke, drink alcohol, or use illegal drugs. Some items may interact with your medicine. What should I watch for while using this medicine? Your condition will be monitored carefully while you are receiving this medicine. You may need blood work done while you are taking this medicine. Do not become pregnant while taking this medicine or for 4 months after stopping it. Women should inform their doctor if they wish to become pregnant or think they might be pregnant. There is a potential  for serious side effects to an unborn child. Talk to your health care professional or pharmacist for more information. Do not breast-feed an infant while taking this medicine or for 4 months after the last dose. What side effects may I notice from receiving this medicine? Side effects that you should report to your doctor or health care professional as soon as possible:  allergic reactions like skin rash, itching or hives, swelling of the face, lips, or tongue  bloody or black, tarry  breathing problems  changes in vision  chest pain  chills  confusion  constipation  cough  diarrhea  dizziness or feeling faint or lightheaded  fast or irregular heartbeat  fever  flushing  joint pain  low blood counts - this medicine may decrease the number of white blood cells, red blood cells and platelets. You may be at increased risk for infections and bleeding.  muscle pain  muscle weakness  pain, tingling, numbness in the hands or feet  persistent headache  redness, blistering, peeling or loosening of the skin, including inside the mouth  signs and symptoms of high blood sugar such as dizziness; dry mouth; dry skin; fruity breath; nausea; stomach pain; increased hunger or thirst; increased urination  signs and symptoms of kidney injury like trouble passing urine or change in the amount of urine  signs and symptoms of liver injury like dark urine, light-colored stools, loss of appetite, nausea, right upper belly pain, yellowing of the eyes or skin  sweating  swollen lymph nodes  weight loss Side effects that usually do not require medical attention (report to your doctor or health care professional if they continue or are bothersome):  decreased appetite  hair loss  muscle pain  tiredness This list may not describe all possible side effects. Call your doctor for medical advice about side effects. You may report side effects to FDA at 1-800-FDA-1088. Where should I  keep my medicine? This drug is given in a hospital or clinic and will not be stored at home. NOTE: This sheet is a summary. It may not cover all possible information. If you have questions about this medicine, talk to your doctor, pharmacist, or health care provider.  2020 Elsevier/Gold Standard (2018-11-07 18:07:58) Docetaxel injection What is this medicine? DOCETAXEL (doe se TAX el) is a chemotherapy drug. It targets fast dividing cells, like cancer cells, and causes these cells to die. This medicine is used to treat many types of cancers like breast cancer, certain stomach cancers, head and neck cancer, lung cancer, and prostate cancer. This medicine may be used for other purposes; ask your health care provider or pharmacist if you have questions. COMMON BRAND NAME(S): Docefrez, Taxotere What should I tell my health care provider before I take this medicine? They need to know if you have any of these conditions:  infection (especially a virus infection such as chickenpox, cold sores, or herpes)  liver disease  low blood counts, like low white cell, platelet, or red cell counts  an unusual or allergic reaction to docetaxel, polysorbate 80, other chemotherapy agents, other medicines, foods, dyes, or preservatives  pregnant or trying to get pregnant  breast-feeding How should I use this medicine? This drug is given as an infusion into a vein. It is administered in a hospital or clinic by a specially trained health care professional. Talk to your pediatrician regarding the use of this medicine in children. Special care may be needed. Overdosage: If you think you have taken too much of this medicine contact a poison control center or emergency room at once. NOTE: This medicine is only for you. Do not share this medicine with others. What if I miss a dose? It is important not to miss your dose. Call your doctor or health care professional if you are unable to keep an appointment. What may  interact with this medicine?  aprepitant  certain antibiotics like erythromycin or clarithromycin  certain antivirals for HIV or hepatitis  certain medicines for fungal infections like fluconazole, itraconazole, ketoconazole, posaconazole, or voriconazole  cimetidine  ciprofloxacin  conivaptan  cyclosporine  dronedarone  fluvoxamine  grapefruit juice  imatinib  verapamil This list may not describe all possible interactions. Give your  health care provider a list of all the medicines, herbs, non-prescription drugs, or dietary supplements you use. Also tell them if you smoke, drink alcohol, or use illegal drugs. Some items may interact with your medicine. What should I watch for while using this medicine? Your condition will be monitored carefully while you are receiving this medicine. You will need important blood work done while you are taking this medicine. Call your doctor or health care professional for advice if you get a fever, chills or sore throat, or other symptoms of a cold or flu. Do not treat yourself. This drug decreases your body's ability to fight infections. Try to avoid being around people who are sick. Some products may contain alcohol. Ask your health care professional if this medicine contains alcohol. Be sure to tell all health care professionals you are taking this medicine. Certain medicines, like metronidazole and disulfiram, can cause an unpleasant reaction when taken with alcohol. The reaction includes flushing, headache, nausea, vomiting, sweating, and increased thirst. The reaction can last from 30 minutes to several hours. You may get drowsy or dizzy. Do not drive, use machinery, or do anything that needs mental alertness until you know how this medicine affects you. Do not stand or sit up quickly, especially if you are an older patient. This reduces the risk of dizzy or fainting spells. Alcohol may interfere with the effect of this medicine. Talk to your  health care professional about your risk of cancer. You may be more at risk for certain types of cancer if you take this medicine. Do not become pregnant while taking this medicine or for 6 months after stopping it. Women should inform their doctor if they wish to become pregnant or think they might be pregnant. There is a potential for serious side effects to an unborn child. Talk to your health care professional or pharmacist for more information. Do not breast-feed an infant while taking this medicine or for 1 week after stopping it. Males who get this medicine must use a condom during sex with females who can get pregnant. If you get a woman pregnant, the baby could have birth defects. The baby could die before they are born. You will need to continue wearing a condom for 3 months after stopping the medicine. Tell your health care provider right away if your partner becomes pregnant while you are taking this medicine. This may interfere with the ability to father a child. You should talk to your doctor or health care professional if you are concerned about your fertility. What side effects may I notice from receiving this medicine? Side effects that you should report to your doctor or health care professional as soon as possible:  allergic reactions like skin rash, itching or hives, swelling of the face, lips, or tongue  blurred vision  breathing problems  changes in vision  low blood counts - This drug may decrease the number of white blood cells, red blood cells and platelets. You may be at increased risk for infections and bleeding.  nausea and vomiting  pain, redness or irritation at site where injected  pain, tingling, numbness in the hands or feet  redness, blistering, peeling, or loosening of the skin, including inside the mouth  signs of decreased platelets or bleeding - bruising, pinpoint red spots on the skin, black, tarry stools, nosebleeds  signs of decreased red blood  cells - unusually weak or tired, fainting spells, lightheadedness  signs of infection - fever or chills, cough, sore throat,  pain or difficulty passing urine  swelling of the ankle, feet, hands Side effects that usually do not require medical attention (report to your doctor or health care professional if they continue or are bothersome):  constipation  diarrhea  fingernail or toenail changes  hair loss  loss of appetite  mouth sores  muscle pain This list may not describe all possible side effects. Call your doctor for medical advice about side effects. You may report side effects to FDA at 1-800-FDA-1088. Where should I keep my medicine? This drug is given in a hospital or clinic and will not be stored at home. NOTE: This sheet is a summary. It may not cover all possible information. If you have questions about this medicine, talk to your doctor, pharmacist, or health care provider.  2020 Elsevier/Gold Standard (2018-08-28 10:19:06)

## 2019-02-04 NOTE — Progress Notes (Addendum)
Hematology and Oncology Follow Up Visit  [name] [id number] 1945/03/17 74 y.o. 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 Iron def anemia -- malabsorption   Current Therapy:    Zytiga 1000 mg by mouth daily - discontinued on 08/15/2016  Xtandi 160 mg po q day - start on 08/15/2016 - discontinued  Xgeva 120 mg subcutaneous Q3 months - due in 04/2019  Lupron 22 mg IM every 3 months - due in 04/2019  Radium-223 therapy -- s/p cycle #6  Palliative radiation to the right sacrum  Lutathera - s/p cycle 4/4 --last dose on 07/11/2017  Carbo/VP-16/Tecentriq --  S/p cycle #5  Keytruda q 6 week -- Maintanence -- s/p cycle #3 - changed from 3 to 6 week on 06/30/2018  Taxotere/Keytruda -- start on 09/17/2018 -- s/p cycle #6  Injectafer 750 mg IV prn     Interim History:  [name] is back for follow-up.  So far, he is doing quite well.  The bad news is that his mother-in-law, who was 37 years old, passed away recently.  She lived with he and his wife.  His wife basically took care of her.  It is a blessing that she passed on peacefully.  He seems to be doing quite well.  He is tolerating treatment nicely.  He really had no toxicity.  There is been no problems with nausea or vomiting.  He has had no cough.  He has had no headache.  He has had no rashes.  There is been no change in bowel or bladder habits.  He has had no further hematuria.  His right leg seems to be holding relatively stable.  He has a brace on the right leg.  He feels like there is swelling in the right foot even though there is not.  Overall, his appetite is been quite good.  His performance status is ECOG 1.       Medications:  Current Outpatient Medications:  .  aspirin 81 MG tablet, Take 81 mg by mouth daily., Disp: , Rfl:  .  Calcium Carbonate-Vitamin D (OYSTER SHELL CALCIUM/D) 250-125 MG-UNIT  TABS, Take by mouth., Disp: , Rfl:  .  CALCIUM-IRON-VIT D-VIT K PO, Take 1 tablet by mouth 2 (two) times daily., Disp: , Rfl:  .  dexamethasone (DECADRON) 4 MG tablet, Take 8 mg by mouth 2 (two) times daily with a meal. , Disp: , Rfl:  .  docusate sodium (COLACE) 100 MG capsule, Take 200 mg by mouth 2 (two) times daily., Disp: , Rfl:  .  gabapentin (NEURONTIN) 300 MG capsule, TAKE 1 CAPSULE BY MOUTH TWICE A DAY THEN TAKE 2 CAPSULES AT BEDTIME (Patient taking differently: Take 300 mg by mouth 2 (two) times daily. ), Disp: 120 capsule, Rfl: 4 .  isosorbide mononitrate (IMDUR) 30 MG 24 hr tablet, TAKE 1 TABLET (30 MG TOTAL) BY MOUTH DAILY., Disp: 90 tablet, Rfl: 1 .  lidocaine-prilocaine (EMLA) cream, , Disp: , Rfl:  .  ondansetron (ZOFRAN) 8 MG tablet, Take 1 tablet (8 mg total) by mouth 2 (two) times daily as needed for nausea or vomiting., Disp: 20 tablet, Rfl: 0 .  OVER THE COUNTER MEDICATION, every morning. Juice Plus -- 8 capsule of each the garden, vineyard, and orchard twice a day., Disp: , Rfl:  .  pantoprazole (PROTONIX) 40 MG tablet, Take 1 tablet (40 mg total) by mouth at bedtime., Disp: 30 tablet,  Rfl: 6 .  prochlorperazine (COMPAZINE) 10 MG tablet, Take 10 mg by mouth every 6 (six) hours as needed (Nausea or vomiting)., Disp: , Rfl:  .  solifenacin (VESICARE) 10 MG tablet, Take 1 tablet (10 mg total) by mouth daily., Disp: 30 tablet, Rfl: 5 .  sucralfate (CARAFATE) 1 GM/10ML suspension, Take 10 mLs (1 g total) by mouth 4 (four) times daily -  with meals and at bedtime., Disp: 420 mL, Rfl: 5 .  ZETIA 10 MG tablet, Take 10 mg by mouth daily. , Disp: , Rfl:  .  acetaminophen (TYLENOL) 500 MG tablet, Take 1,000 mg by mouth every 4 (four) hours as needed for moderate pain., Disp: , Rfl:  .  nitroGLYCERIN (NITROSTAT) 0.4 MG SL tablet, Place 1 tablet (0.4 mg total) under the tongue every 5 (five) minutes as needed for chest pain. (Patient not taking: Reported on 02/04/2019), Disp: 25 tablet, Rfl:  11 No current facility-administered medications for this visit.  Facility-Administered Medications Ordered in Other Visits:  .  octreotide (SANDOSTATIN LAR) IM injection 30 mg, 30 mg, Intramuscular, Once, Maricel Swartzendruber, Rudell Cobb, MD  Allergies:  Allergies  Allergen Reactions  . Codeine Nausea And Vomiting  . Hydrocodone Nausea And Vomiting    Past Medical History, Surgical history, Social history, and Family History were reviewed and updated.  Review of Systems: Review of Systems  Constitutional: Negative.   HENT: Negative.   Eyes: Negative.   Respiratory: Negative.   Cardiovascular: Negative.   Gastrointestinal: Negative.   Genitourinary: Negative.  Negative for urgency.  Musculoskeletal: Positive for joint pain.  Skin: Negative.   Neurological: Positive for focal weakness.  Endo/Heme/Allergies: Negative.   Psychiatric/Behavioral: Negative.   All other systems reviewed and are negative.    Physical Exam:  weight is 154 lb (69.9 kg). His temporal temperature is 96.9 F (36.1 C) (abnormal). His blood pressure is 138/73 and his pulse is 89. His respiration is 18 and oxygen saturation is 100%. PET scan  Physical Exam Vitals reviewed.  HENT:     Head: Normocephalic and atraumatic.  Eyes:     Pupils: Pupils are equal, round, and reactive to light.  Cardiovascular:     Rate and Rhythm: Normal rate and regular rhythm.     Heart sounds: Normal heart sounds.  Pulmonary:     Effort: Pulmonary effort is normal.     Breath sounds: Normal breath sounds.  Abdominal:     General: Bowel sounds are normal.     Palpations: Abdomen is soft.  Musculoskeletal:        General: No tenderness or deformity. Normal range of motion.     Cervical back: Normal range of motion.  Lymphadenopathy:     Cervical: No cervical adenopathy.  Skin:    General: Skin is warm and dry.     Findings: No erythema or rash.  Neurological:     Mental Status: He is alert and oriented to person, place, and time.   Psychiatric:        Behavior: Behavior normal.        Thought Content: Thought content normal.        Judgment: Judgment normal.      Lab Results  Component Value Date   WBC 9.4 02/04/2019   HGB 10.9 (L) 02/04/2019   HCT 34.0 (L) 02/04/2019   MCV 100.0 02/04/2019   PLT 331 02/04/2019     Chemistry      Component Value Date/Time   NA 138 02/04/2019 0900  NA 144 12/14/2016 1159   NA 140 10/22/2016 1129   K 4.3 02/04/2019 0900   K 3.5 12/14/2016 1159   K 4.3 10/22/2016 1129   CL 104 02/04/2019 0900   CL 103 12/14/2016 1159   CO2 25 02/04/2019 0900   CO2 30 12/14/2016 1159   CO2 27 10/22/2016 1129   BUN 18 02/04/2019 0900   BUN 16 12/14/2016 1159   BUN 14.0 10/22/2016 1129   CREATININE 1.08 02/04/2019 0900   CREATININE 1.3 (H) 12/14/2016 1159   CREATININE 1.1 10/22/2016 1129      Component Value Date/Time   CALCIUM 9.7 02/04/2019 0900   CALCIUM 10.0 12/14/2016 1159   CALCIUM 10.4 10/22/2016 1129   ALKPHOS 58 02/04/2019 0900   ALKPHOS 48 12/14/2016 1159   ALKPHOS 60 10/22/2016 1129   AST 12 (L) 02/04/2019 0900   AST 16 10/22/2016 1129   ALT 7 02/04/2019 0900   ALT 23 12/14/2016 1159   ALT 14 10/22/2016 1129   BILITOT 0.3 02/04/2019 0900   BILITOT 0.31 10/22/2016 1129      Impression and Plan: [name] is 74 year old gentleman with 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  We will go ahead with his 6th cycle of treatment.  He will also get Lupron today and Xgeva today.  Of note, his iron studies are low.  His iron saturation is only 17%.  He is on quite a few medications.  I do not think he will be able to absorb oral iron.  As such, we will try to give him a dose of IV iron to make sure that his hemoglobin does not drop and that he becomes symptomatic.  As always, we talked about [facility] [facility] [employer] basketball.  I made mention that the last time I saw him, that my team, [employer], beat down [employer] basketball.  I will plan to get him back here in  another 3 weeks.  He wanted know if he could go back to work.  I told him that if he felt up to it, I would have no problems with his.  I would wait until we do our next PET scan.   [name], MD 1/20/20219:43 AM

## 2019-02-05 ENCOUNTER — Telehealth: Payer: Self-pay | Admitting: Hematology & Oncology

## 2019-02-05 LAB — CHROMOGRANIN A: Chromogranin A (ng/mL): 447.8 ng/mL — ABNORMAL HIGH (ref 0.0–101.8)

## 2019-02-05 NOTE — Telephone Encounter (Signed)
Called and LMVM for patient regarding appointment added per iron infusion per 1/21 result note

## 2019-02-06 NOTE — Telephone Encounter (Signed)
Patient called stating he doesn't know if his Onpro delivered the medication. After speaking to patient's wife she noticed the black indicator line on "empty" and no wetness surrounding the Onpro unit. Patient thanked me for my call. I instructed patient to call us with any further questions or concerns. Patient verbalized understanding.

## 2019-02-11 ENCOUNTER — Other Ambulatory Visit: Payer: Self-pay

## 2019-02-11 ENCOUNTER — Other Ambulatory Visit: Payer: Self-pay | Admitting: Family

## 2019-02-11 ENCOUNTER — Inpatient Hospital Stay: Payer: Medicare Other

## 2019-02-11 VITALS — BP 102/63 | HR 87 | Temp 97.6°F | Resp 17

## 2019-02-11 DIAGNOSIS — Z5111 Encounter for antineoplastic chemotherapy: Secondary | ICD-10-CM | POA: Diagnosis not present

## 2019-02-11 DIAGNOSIS — C61 Malignant neoplasm of prostate: Secondary | ICD-10-CM | POA: Diagnosis not present

## 2019-02-11 DIAGNOSIS — C7B8 Other secondary neuroendocrine tumors: Secondary | ICD-10-CM | POA: Diagnosis not present

## 2019-02-11 DIAGNOSIS — C7951 Secondary malignant neoplasm of bone: Secondary | ICD-10-CM | POA: Diagnosis not present

## 2019-02-11 DIAGNOSIS — D509 Iron deficiency anemia, unspecified: Secondary | ICD-10-CM | POA: Diagnosis not present

## 2019-02-11 DIAGNOSIS — C7A8 Other malignant neuroendocrine tumors: Secondary | ICD-10-CM

## 2019-02-11 DIAGNOSIS — Z5112 Encounter for antineoplastic immunotherapy: Secondary | ICD-10-CM | POA: Diagnosis not present

## 2019-02-11 MED ORDER — SODIUM CHLORIDE 0.9 % IV SOLN
INTRAVENOUS | Status: DC
  Filled 2019-02-11: qty 250

## 2019-02-11 MED ORDER — HEPARIN SOD (PORK) LOCK FLUSH 100 UNIT/ML IV SOLN
500.0000 [IU] | Freq: Once | INTRAVENOUS | Status: AC | PRN
  Administered 2019-02-11: 15:00:00 500 [IU]
  Filled 2019-02-11: qty 5

## 2019-02-11 MED ORDER — SODIUM CHLORIDE 0.9 % IV SOLN
750.0000 mg | Freq: Once | INTRAVENOUS | Status: AC
  Administered 2019-02-11: 750 mg via INTRAVENOUS
  Filled 2019-02-11: qty 15

## 2019-02-11 MED ORDER — SODIUM CHLORIDE 0.9% FLUSH
10.0000 mL | INTRAVENOUS | Status: DC | PRN
  Administered 2019-02-11: 10 mL
  Filled 2019-02-11: qty 10

## 2019-02-11 NOTE — Patient Instructions (Signed)

## 2019-02-18 ENCOUNTER — Other Ambulatory Visit: Payer: Self-pay | Admitting: *Deleted

## 2019-02-18 DIAGNOSIS — E032 Hypothyroidism due to medicaments and other exogenous substances: Secondary | ICD-10-CM

## 2019-02-18 DIAGNOSIS — C61 Malignant neoplasm of prostate: Secondary | ICD-10-CM

## 2019-02-18 DIAGNOSIS — C7A8 Other malignant neuroendocrine tumors: Secondary | ICD-10-CM

## 2019-02-18 DIAGNOSIS — C7951 Secondary malignant neoplasm of bone: Secondary | ICD-10-CM

## 2019-02-25 ENCOUNTER — Other Ambulatory Visit: Payer: Self-pay

## 2019-02-25 ENCOUNTER — Encounter: Payer: Self-pay | Admitting: Family

## 2019-02-25 ENCOUNTER — Inpatient Hospital Stay: Payer: Medicare Other

## 2019-02-25 ENCOUNTER — Inpatient Hospital Stay: Payer: Medicare Other | Attending: Hematology & Oncology | Admitting: Family

## 2019-02-25 ENCOUNTER — Telehealth: Payer: Self-pay | Admitting: Hematology & Oncology

## 2019-02-25 VITALS — BP 115/68 | HR 92 | Temp 96.8°F | Resp 18 | Ht 68.0 in | Wt 151.0 lb

## 2019-02-25 DIAGNOSIS — C7982 Secondary malignant neoplasm of genital organs: Secondary | ICD-10-CM | POA: Diagnosis not present

## 2019-02-25 DIAGNOSIS — C7A8 Other malignant neuroendocrine tumors: Secondary | ICD-10-CM

## 2019-02-25 DIAGNOSIS — R0789 Other chest pain: Secondary | ICD-10-CM | POA: Insufficient documentation

## 2019-02-25 DIAGNOSIS — Z5112 Encounter for antineoplastic immunotherapy: Secondary | ICD-10-CM | POA: Diagnosis not present

## 2019-02-25 DIAGNOSIS — E032 Hypothyroidism due to medicaments and other exogenous substances: Secondary | ICD-10-CM

## 2019-02-25 DIAGNOSIS — Z79899 Other long term (current) drug therapy: Secondary | ICD-10-CM | POA: Insufficient documentation

## 2019-02-25 DIAGNOSIS — C7951 Secondary malignant neoplasm of bone: Secondary | ICD-10-CM

## 2019-02-25 DIAGNOSIS — D500 Iron deficiency anemia secondary to blood loss (chronic): Secondary | ICD-10-CM

## 2019-02-25 DIAGNOSIS — Z885 Allergy status to narcotic agent status: Secondary | ICD-10-CM | POA: Diagnosis not present

## 2019-02-25 DIAGNOSIS — K909 Intestinal malabsorption, unspecified: Secondary | ICD-10-CM | POA: Diagnosis not present

## 2019-02-25 DIAGNOSIS — C7B8 Other secondary neuroendocrine tumors: Secondary | ICD-10-CM

## 2019-02-25 DIAGNOSIS — Z5111 Encounter for antineoplastic chemotherapy: Secondary | ICD-10-CM | POA: Insufficient documentation

## 2019-02-25 DIAGNOSIS — Z5189 Encounter for other specified aftercare: Secondary | ICD-10-CM | POA: Insufficient documentation

## 2019-02-25 DIAGNOSIS — D509 Iron deficiency anemia, unspecified: Secondary | ICD-10-CM | POA: Diagnosis not present

## 2019-02-25 DIAGNOSIS — C61 Malignant neoplasm of prostate: Secondary | ICD-10-CM | POA: Diagnosis present

## 2019-02-25 LAB — CBC WITH DIFFERENTIAL (CANCER CENTER ONLY)
Abs Immature Granulocytes: 0.05 10*3/uL (ref 0.00–0.07)
Basophils Absolute: 0.1 10*3/uL (ref 0.0–0.1)
Basophils Relative: 1 %
Eosinophils Absolute: 0.1 10*3/uL (ref 0.0–0.5)
Eosinophils Relative: 1 %
HCT: 35.5 % — ABNORMAL LOW (ref 39.0–52.0)
Hemoglobin: 11.5 g/dL — ABNORMAL LOW (ref 13.0–17.0)
Immature Granulocytes: 1 %
Lymphocytes Relative: 10 %
Lymphs Abs: 1 10*3/uL (ref 0.7–4.0)
MCH: 32.3 pg (ref 26.0–34.0)
MCHC: 32.4 g/dL (ref 30.0–36.0)
MCV: 99.7 fL (ref 80.0–100.0)
Monocytes Absolute: 0.7 10*3/uL (ref 0.1–1.0)
Monocytes Relative: 7 %
Neutro Abs: 8.4 10*3/uL — ABNORMAL HIGH (ref 1.7–7.7)
Neutrophils Relative %: 80 %
Platelet Count: 317 10*3/uL (ref 150–400)
RBC: 3.56 MIL/uL — ABNORMAL LOW (ref 4.22–5.81)
RDW: 16.3 % — ABNORMAL HIGH (ref 11.5–15.5)
WBC Count: 10.3 10*3/uL (ref 4.0–10.5)
nRBC: 0 % (ref 0.0–0.2)

## 2019-02-25 LAB — CMP (CANCER CENTER ONLY)
ALT: 9 U/L (ref 0–44)
AST: 13 U/L — ABNORMAL LOW (ref 15–41)
Albumin: 4.4 g/dL (ref 3.5–5.0)
Alkaline Phosphatase: 66 U/L (ref 38–126)
Anion gap: 9 (ref 5–15)
BUN: 17 mg/dL (ref 8–23)
CO2: 25 mmol/L (ref 22–32)
Calcium: 9.4 mg/dL (ref 8.9–10.3)
Chloride: 104 mmol/L (ref 98–111)
Creatinine: 1.11 mg/dL (ref 0.61–1.24)
GFR, Est AFR Am: >60 mL/min (ref >60)
GFR, Estimated: >60 mL/min (ref >60)
Glucose, Bld: 121 mg/dL — ABNORMAL HIGH (ref 70–99)
Potassium: 4.5 mmol/L (ref 3.5–5.1)
Sodium: 138 mmol/L (ref 135–145)
Total Bilirubin: 0.4 mg/dL (ref 0.3–1.2)
Total Protein: 6.6 g/dL (ref 6.5–8.1)

## 2019-02-25 LAB — LACTATE DEHYDROGENASE: LDH: 182 U/L (ref 98–192)

## 2019-02-25 MED ORDER — SODIUM CHLORIDE 0.9 % IV SOLN
80.0000 mg/m2 | Freq: Once | INTRAVENOUS | Status: AC
  Administered 2019-02-25: 14:00:00 150 mg via INTRAVENOUS
  Filled 2019-02-25: qty 15

## 2019-02-25 MED ORDER — SODIUM CHLORIDE 0.9 % IV SOLN
Freq: Once | INTRAVENOUS | Status: AC
  Filled 2019-02-25: qty 250

## 2019-02-25 MED ORDER — DEXAMETHASONE SODIUM PHOSPHATE 10 MG/ML IJ SOLN
10.0000 mg | Freq: Once | INTRAMUSCULAR | Status: AC
  Administered 2019-02-25: 10 mg via INTRAVENOUS

## 2019-02-25 MED ORDER — DEXAMETHASONE SODIUM PHOSPHATE 10 MG/ML IJ SOLN
INTRAMUSCULAR | Status: AC
  Filled 2019-02-25: qty 1

## 2019-02-25 MED ORDER — PROCHLORPERAZINE MALEATE 10 MG PO TABS
10.0000 mg | ORAL_TABLET | Freq: Once | ORAL | Status: AC
  Administered 2019-02-25: 10 mg via ORAL

## 2019-02-25 MED ORDER — SODIUM CHLORIDE 0.9 % IV SOLN
200.0000 mg | Freq: Once | INTRAVENOUS | Status: AC
  Administered 2019-02-25: 13:00:00 200 mg via INTRAVENOUS
  Filled 2019-02-25: qty 8

## 2019-02-25 MED ORDER — PEGFILGRASTIM 6 MG/0.6ML ~~LOC~~ PSKT
PREFILLED_SYRINGE | SUBCUTANEOUS | Status: AC
  Filled 2019-02-25: qty 0.6

## 2019-02-25 MED ORDER — PROCHLORPERAZINE MALEATE 10 MG PO TABS
ORAL_TABLET | ORAL | Status: AC
  Filled 2019-02-25: qty 1

## 2019-02-25 MED ORDER — PEGFILGRASTIM 6 MG/0.6ML ~~LOC~~ PSKT
6.0000 mg | PREFILLED_SYRINGE | Freq: Once | SUBCUTANEOUS | Status: AC
  Administered 2019-02-25: 6 mg via SUBCUTANEOUS

## 2019-02-25 MED FILL — GABAPENTIN 300 MG CAPSULE: 300 | 30 days supply | Qty: 120 | Fill #3

## 2019-02-25 MED FILL — EZETIMIBE 10 MG TABS: 10 | 90 days supply | Qty: 90 | Fill #1

## 2019-02-25 MED FILL — SOLIFENACIN SUCCINATE 10 MG: 10 | 30 days supply | Qty: 30 | Fill #4

## 2019-02-25 MED FILL — PANTOPRAZOLE SOD DR 40 MG T: 40 | 30 days supply | Qty: 30 | Fill #2

## 2019-02-25 NOTE — Patient Instructions (Signed)
Pembrolizumab injection What is this medicine? PEMBROLIZUMAB (pem broe liz ue mab) is a monoclonal antibody. It is used to treat certain types of cancer. This medicine may be used for other purposes; ask your health care provider or pharmacist if you have questions. COMMON BRAND NAME(S): Keytruda What should I tell my health care provider before I take this medicine? They need to know if you have any of these conditions:  diabetes  immune system problems  inflammatory bowel disease  liver disease  lung or breathing disease  lupus  received or scheduled to receive an organ transplant or a stem-cell transplant that uses donor stem cells  an unusual or allergic reaction to pembrolizumab, other medicines, foods, dyes, or preservatives  pregnant or trying to get pregnant  breast-feeding How should I use this medicine? This medicine is for infusion into a vein. It is given by a health care professional in a hospital or clinic setting. A special MedGuide will be given to you before each treatment. Be sure to read this information carefully each time. Talk to your pediatrician regarding the use of this medicine in children. While this drug may be prescribed for children as young as 6 months for selected conditions, precautions do apply. Overdosage: If you think you have taken too much of this medicine contact a poison control center or emergency room at once. NOTE: This medicine is only for you. Do not share this medicine with others. What if I miss a dose? It is important not to miss your dose. Call your doctor or health care professional if you are unable to keep an appointment. What may interact with this medicine? Interactions have not been studied. Give your health care provider a list of all the medicines, herbs, non-prescription drugs, or dietary supplements you use. Also tell them if you smoke, drink alcohol, or use illegal drugs. Some items may interact with your medicine. This  list may not describe all possible interactions. Give your health care provider a list of all the medicines, herbs, non-prescription drugs, or dietary supplements you use. Also tell them if you smoke, drink alcohol, or use illegal drugs. Some items may interact with your medicine. What should I watch for while using this medicine? Your condition will be monitored carefully while you are receiving this medicine. You may need blood work done while you are taking this medicine. Do not become pregnant while taking this medicine or for 4 months after stopping it. Women should inform their doctor if they wish to become pregnant or think they might be pregnant. There is a potential for serious side effects to an unborn child. Talk to your health care professional or pharmacist for more information. Do not breast-feed an infant while taking this medicine or for 4 months after the last dose. What side effects may I notice from receiving this medicine? Side effects that you should report to your doctor or health care professional as soon as possible:  allergic reactions like skin rash, itching or hives, swelling of the face, lips, or tongue  bloody or black, tarry  breathing problems  changes in vision  chest pain  chills  confusion  constipation  cough  diarrhea  dizziness or feeling faint or lightheaded  fast or irregular heartbeat  fever  flushing  joint pain  low blood counts - this medicine may decrease the number of white blood cells, red blood cells and platelets. You may be at increased risk for infections and bleeding.  muscle pain  muscle  weakness  pain, tingling, numbness in the hands or feet  persistent headache  redness, blistering, peeling or loosening of the skin, including inside the mouth  signs and symptoms of high blood sugar such as dizziness; dry mouth; dry skin; fruity breath; nausea; stomach pain; increased hunger or thirst; increased urination  signs  and symptoms of kidney injury like trouble passing urine or change in the amount of urine  signs and symptoms of liver injury like dark urine, light-colored stools, loss of appetite, nausea, right upper belly pain, yellowing of the eyes or skin  sweating  swollen lymph nodes  weight loss Side effects that usually do not require medical attention (report to your doctor or health care professional if they continue or are bothersome):  decreased appetite  hair loss  muscle pain  tiredness This list may not describe all possible side effects. Call your doctor for medical advice about side effects. You may report side effects to FDA at 1-800-FDA-1088. Where should I keep my medicine? This drug is given in a hospital or clinic and will not be stored at home. NOTE: This sheet is a summary. It may not cover all possible information. If you have questions about this medicine, talk to your doctor, pharmacist, or health care provider.  2020 Elsevier/Gold Standard (2018-11-07 18:07:58) Docetaxel injection What is this medicine? DOCETAXEL (doe se TAX el) is a chemotherapy drug. It targets fast dividing cells, like cancer cells, and causes these cells to die. This medicine is used to treat many types of cancers like breast cancer, certain stomach cancers, head and neck cancer, lung cancer, and prostate cancer. This medicine may be used for other purposes; ask your health care provider or pharmacist if you have questions. COMMON BRAND NAME(S): Docefrez, Taxotere What should I tell my health care provider before I take this medicine? They need to know if you have any of these conditions:  infection (especially a virus infection such as chickenpox, cold sores, or herpes)  liver disease  low blood counts, like low white cell, platelet, or red cell counts  an unusual or allergic reaction to docetaxel, polysorbate 80, other chemotherapy agents, other medicines, foods, dyes, or  preservatives  pregnant or trying to get pregnant  breast-feeding How should I use this medicine? This drug is given as an infusion into a vein. It is administered in a hospital or clinic by a specially trained health care professional. Talk to your pediatrician regarding the use of this medicine in children. Special care may be needed. Overdosage: If you think you have taken too much of this medicine contact a poison control center or emergency room at once. NOTE: This medicine is only for you. Do not share this medicine with others. What if I miss a dose? It is important not to miss your dose. Call your doctor or health care professional if you are unable to keep an appointment. What may interact with this medicine?  aprepitant  certain antibiotics like erythromycin or clarithromycin  certain antivirals for HIV or hepatitis  certain medicines for fungal infections like fluconazole, itraconazole, ketoconazole, posaconazole, or voriconazole  cimetidine  ciprofloxacin  conivaptan  cyclosporine  dronedarone  fluvoxamine  grapefruit juice  imatinib  verapamil This list may not describe all possible interactions. Give your health care provider a list of all the medicines, herbs, non-prescription drugs, or dietary supplements you use. Also tell them if you smoke, drink alcohol, or use illegal drugs. Some items may interact with your medicine. What should I  watch for while using this medicine? Your condition will be monitored carefully while you are receiving this medicine. You will need important blood work done while you are taking this medicine. Call your doctor or health care professional for advice if you get a fever, chills or sore throat, or other symptoms of a cold or flu. Do not treat yourself. This drug decreases your body's ability to fight infections. Try to avoid being around people who are sick. Some products may contain alcohol. Ask your health care professional if  this medicine contains alcohol. Be sure to tell all health care professionals you are taking this medicine. Certain medicines, like metronidazole and disulfiram, can cause an unpleasant reaction when taken with alcohol. The reaction includes flushing, headache, nausea, vomiting, sweating, and increased thirst. The reaction can last from 30 minutes to several hours. You may get drowsy or dizzy. Do not drive, use machinery, or do anything that needs mental alertness until you know how this medicine affects you. Do not stand or sit up quickly, especially if you are an older patient. This reduces the risk of dizzy or fainting spells. Alcohol may interfere with the effect of this medicine. Talk to your health care professional about your risk of cancer. You may be more at risk for certain types of cancer if you take this medicine. Do not become pregnant while taking this medicine or for 6 months after stopping it. Women should inform their doctor if they wish to become pregnant or think they might be pregnant. There is a potential for serious side effects to an unborn child. Talk to your health care professional or pharmacist for more information. Do not breast-feed an infant while taking this medicine or for 1 week after stopping it. Males who get this medicine must use a condom during sex with females who can get pregnant. If you get a woman pregnant, the baby could have birth defects. The baby could die before they are born. You will need to continue wearing a condom for 3 months after stopping the medicine. Tell your health care provider right away if your partner becomes pregnant while you are taking this medicine. This may interfere with the ability to father a child. You should talk to your doctor or health care professional if you are concerned about your fertility. What side effects may I notice from receiving this medicine? Side effects that you should report to your doctor or health care professional as  soon as possible:  allergic reactions like skin rash, itching or hives, swelling of the face, lips, or tongue  blurred vision  breathing problems  changes in vision  low blood counts - This drug may decrease the number of white blood cells, red blood cells and platelets. You may be at increased risk for infections and bleeding.  nausea and vomiting  pain, redness or irritation at site where injected  pain, tingling, numbness in the hands or feet  redness, blistering, peeling, or loosening of the skin, including inside the mouth  signs of decreased platelets or bleeding - bruising, pinpoint red spots on the skin, black, tarry stools, nosebleeds  signs of decreased red blood cells - unusually weak or tired, fainting spells, lightheadedness  signs of infection - fever or chills, cough, sore throat, pain or difficulty passing urine  swelling of the ankle, feet, hands Side effects that usually do not require medical attention (report to your doctor or health care professional if they continue or are bothersome):  constipation  diarrhea  fingernail or toenail changes  hair loss  loss of appetite  mouth sores  muscle pain This list may not describe all possible side effects. Call your doctor for medical advice about side effects. You may report side effects to FDA at 1-800-FDA-1088. Where should I keep my medicine? This drug is given in a hospital or clinic and will not be stored at home. NOTE: This sheet is a summary. It may not cover all possible information. If you have questions about this medicine, talk to your doctor, pharmacist, or health care provider.  2020 Elsevier/Gold Standard (2018-08-28 10:19:06)

## 2019-02-25 NOTE — Patient Instructions (Signed)

## 2019-02-25 NOTE — Telephone Encounter (Signed)
Appointments scheduled calendar printed per 2/10 los

## 2019-02-25 NOTE — Progress Notes (Signed)
Hematology and Oncology Follow Up Visit  [name] [id number] Feb 04, 1945 74 y.o. 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 He is doing well but had an episode of pain in the lungs after eating yesterday. He states that this has happened briefly a couple times in the past but yesterday it lasted for about an hour before resolving.  He has had no issues so far today.  No episodes of bleeding. No bruising or petechiae.  No fever, chills, n/v, cough, rash, dizziness, SOB, chest pain, palpitations, abdominal pain or changes in bowel or bladder habits at this time.  No tenderness, numbness or tingling in his extremities.  He has mild puffiness in his lower extremities that comes and goes. He does wear compression stockings daily.  He ambulates with a can for support and uses a walker some upstairs at home.  No falls or syncopal episodes to report.  He states that he is eating well and is staying well hydrated. Her weight is stable.   ECOG Performance Status: 1 - Symptomatic but completely ambulatory  Medications:  Allergies as of 02/25/2019      Reactions   Codeine Nausea And Vomiting   Hydrocodone Nausea And Vomiting      Medication List       Accurate as of February 25, 2019  1:06 PM. If  you have any questions, ask your nurse or doctor.        acetaminophen 500 MG tablet Commonly known as: TYLENOL Take 1,000 mg by mouth every 4 (four) hours as needed for moderate pain.   aspirin 81 MG tablet Take 81 mg by mouth daily.   CALCIUM-IRON-VIT D-VIT K PO Take 1 tablet by mouth 2 (two) times daily.   dexamethasone 4 MG tablet Commonly known as: DECADRON Take 8 mg by mouth 2 (two) times daily with a meal.   docusate sodium 100 MG capsule Commonly known as: COLACE Take 200 mg by mouth 2 (two) times daily.   gabapentin 300 MG capsule Commonly known as: NEURONTIN TAKE 1 CAPSULE BY MOUTH TWICE A DAY THEN TAKE 2 CAPSULES AT BEDTIME What changed: See the new instructions.   isosorbide mononitrate 30 MG 24 hr tablet Commonly known as: IMDUR TAKE 1 TABLET (30 MG TOTAL) BY MOUTH DAILY.   lidocaine-prilocaine cream Commonly known as: EMLA   nitroGLYCERIN 0.4 MG SL tablet Commonly known as: NITROSTAT Place 1 tablet (0.4 mg total) under the tongue every 5 (five) minutes as needed for chest pain.   ondansetron 8 MG tablet Commonly known as: ZOFRAN Take 1 tablet (8 mg total) by mouth 2 (two) times daily as needed for nausea or vomiting.   OVER THE COUNTER MEDICATION every morning. Juice Plus -- 8 capsule of each the garden, vineyard, and orchard twice a day.  Oyster Shell Calcium/D 250-125 MG-UNIT Tabs Take by mouth.   pantoprazole 40 MG tablet Commonly known as: Protonix Take 1 tablet (40 mg total) by mouth at bedtime.   prochlorperazine 10 MG tablet Commonly known as: COMPAZINE Take 10 mg by mouth every 6 (six) hours as needed (Nausea or vomiting).   solifenacin 10 MG tablet Commonly known as: VESICARE Take 1 tablet (10 mg total) by mouth daily.   sucralfate 1 GM/10ML suspension Commonly known as: Carafate Take 10 mLs (1 g total) by mouth 4 (four) times daily -  with meals and at bedtime.   Zetia 10 MG tablet Generic drug: ezetimibe Take 10 mg by mouth  daily.       Allergies:  Allergies  Allergen Reactions  . Codeine Nausea And Vomiting  . Hydrocodone Nausea And Vomiting    Past Medical History, Surgical history, Social history, and Family History were reviewed and updated.  Review of Systems: All other 10 point review of systems is negative.   Physical Exam:  height is 5\' 8"  (1.727 m) and weight is 151 lb 0.6 oz (68.5 kg). His temporal temperature is 96.8 F (36 C) (abnormal). His blood pressure is 115/68 and his pulse is 92. His respiration is 18 and oxygen saturation is 100%.   Wt Readings from Last 3 Encounters:  02/25/19 151 lb 0.6 oz (68.5 kg)  02/04/19 154 lb (69.9 kg)  01/14/19 157 lb 1.9 oz (71.3 kg)    Ocular: Sclerae unicteric, pupils equal, round and reactive to light Ear-nose-throat: Oropharynx clear, dentition fair Lymphatic: No cervical or supraclavicular adenopathy Lungs no rales or rhonchi, good excursion bilaterally Heart regular rate and rhythm, no murmur appreciated Abd soft, nontender, positive bowel sounds, no liver or spleen tip palpated on exam, no fluid wave  MSK no focal spinal tenderness, no joint edema Neuro: non-focal, well-oriented, appropriate affect Breasts: Deferred   Lab Results  Component Value Date   WBC 10.3 02/25/2019   HGB 11.5 (L) 02/25/2019   HCT 35.5 (L) 02/25/2019   MCV 99.7 02/25/2019   PLT 317 02/25/2019   Lab Results  Component Value Date   FERRITIN 76 02/04/2019   IRON 52 02/04/2019   TIBC 313 02/04/2019   UIBC 260 02/04/2019   IRONPCTSAT 17 (L) 02/04/2019   Lab Results  Component Value Date   RBC 3.56 (L) 02/25/2019   No results found for: KPAFRELGTCHN, LAMBDASER, KAPLAMBRATIO No results found for: Kandis Cocking, IGMSERUM No results found for: Odetta Pink, SPEI   Chemistry      Component Value Date/Time   NA 138 02/25/2019 1030   NA 144 12/14/2016 1159   NA 140 10/22/2016 1129   K 4.5 02/25/2019  1030   K 3.5 12/14/2016 1159   K 4.3 10/22/2016 1129   CL 104 02/25/2019 1030   CL 103 12/14/2016 1159   CO2 25 02/25/2019 1030   CO2 30 12/14/2016 1159   CO2 27 10/22/2016 1129   BUN 17 02/25/2019 1030   BUN 16 12/14/2016 1159   BUN 14.0 10/22/2016 1129   CREATININE 1.11 02/25/2019 1030   CREATININE 1.3 (H) 12/14/2016 1159   CREATININE 1.1 10/22/2016 1129      Component Value Date/Time   CALCIUM 9.4 02/25/2019 1030   CALCIUM 10.0 12/14/2016 1159   CALCIUM 10.4 10/22/2016 1129   ALKPHOS 66 02/25/2019 1030   ALKPHOS 48 12/14/2016 1159   ALKPHOS 60 10/22/2016 1129   AST 13 (L) 02/25/2019 1030  AST 16 10/22/2016 1129   ALT 9 02/25/2019 1030   ALT 23 12/14/2016 1159   ALT 14 10/22/2016 1129   BILITOT 0.4 02/25/2019 1030   BILITOT 0.31 10/22/2016 1129       Impression and Plan: Ms. Pierron is a pleasant 74 yo caucasian gentleman with metastatic neuroendocrine tumor of unknown primary. Chromogranin A level last month was 447.8.  He continues to tolerate treatment well and his only complaint was the episode of lung pain after eating yesterday.  We will proceed with treatment today as planned and repeat his PET scan around the second week in March.  Follow-up in 3 weeks.  He will contact our office with any questions or concerns. We can certainly see him sooner if needed.   Laverna Peace, NP 2/10/20211:06 PM

## 2019-02-26 LAB — CHROMOGRANIN A: Chromogranin A (ng/mL): 498.2 ng/mL — ABNORMAL HIGH (ref 0.0–101.8)

## 2019-02-26 LAB — TSH: TSH: 3.314 u[IU]/mL (ref 0.320–4.118)

## 2019-03-18 ENCOUNTER — Ambulatory Visit: Payer: Medicare Other

## 2019-03-18 ENCOUNTER — Inpatient Hospital Stay: Payer: Medicare Other

## 2019-03-18 ENCOUNTER — Inpatient Hospital Stay (HOSPITAL_BASED_OUTPATIENT_CLINIC_OR_DEPARTMENT_OTHER): Payer: Medicare Other | Admitting: Hematology & Oncology

## 2019-03-18 ENCOUNTER — Encounter: Payer: Self-pay | Admitting: Hematology & Oncology

## 2019-03-18 ENCOUNTER — Other Ambulatory Visit: Payer: Medicare Other

## 2019-03-18 ENCOUNTER — Other Ambulatory Visit: Payer: Self-pay

## 2019-03-18 ENCOUNTER — Inpatient Hospital Stay: Payer: Medicare Other | Attending: Hematology & Oncology

## 2019-03-18 ENCOUNTER — Ambulatory Visit: Payer: Medicare Other | Admitting: Hematology & Oncology

## 2019-03-18 VITALS — BP 111/70 | HR 98 | Temp 97.3°F | Resp 16 | Wt 152.0 lb

## 2019-03-18 DIAGNOSIS — C7801 Secondary malignant neoplasm of right lung: Secondary | ICD-10-CM | POA: Diagnosis not present

## 2019-03-18 DIAGNOSIS — C7A8 Other malignant neuroendocrine tumors: Secondary | ICD-10-CM

## 2019-03-18 DIAGNOSIS — Z79899 Other long term (current) drug therapy: Secondary | ICD-10-CM | POA: Diagnosis not present

## 2019-03-18 DIAGNOSIS — C7951 Secondary malignant neoplasm of bone: Secondary | ICD-10-CM

## 2019-03-18 DIAGNOSIS — D509 Iron deficiency anemia, unspecified: Secondary | ICD-10-CM | POA: Diagnosis not present

## 2019-03-18 DIAGNOSIS — Z5189 Encounter for other specified aftercare: Secondary | ICD-10-CM | POA: Insufficient documentation

## 2019-03-18 DIAGNOSIS — C61 Malignant neoplasm of prostate: Secondary | ICD-10-CM | POA: Insufficient documentation

## 2019-03-18 DIAGNOSIS — Z5112 Encounter for antineoplastic immunotherapy: Secondary | ICD-10-CM | POA: Diagnosis not present

## 2019-03-18 DIAGNOSIS — Z5111 Encounter for antineoplastic chemotherapy: Secondary | ICD-10-CM | POA: Diagnosis not present

## 2019-03-18 DIAGNOSIS — R200 Anesthesia of skin: Secondary | ICD-10-CM | POA: Insufficient documentation

## 2019-03-18 DIAGNOSIS — R531 Weakness: Secondary | ICD-10-CM | POA: Insufficient documentation

## 2019-03-18 DIAGNOSIS — C7B8 Other secondary neuroendocrine tumors: Secondary | ICD-10-CM

## 2019-03-18 DIAGNOSIS — R319 Hematuria, unspecified: Secondary | ICD-10-CM | POA: Diagnosis not present

## 2019-03-18 DIAGNOSIS — D500 Iron deficiency anemia secondary to blood loss (chronic): Secondary | ICD-10-CM

## 2019-03-18 DIAGNOSIS — K909 Intestinal malabsorption, unspecified: Secondary | ICD-10-CM | POA: Diagnosis not present

## 2019-03-18 DIAGNOSIS — M2550 Pain in unspecified joint: Secondary | ICD-10-CM | POA: Diagnosis not present

## 2019-03-18 DIAGNOSIS — Z885 Allergy status to narcotic agent status: Secondary | ICD-10-CM | POA: Diagnosis not present

## 2019-03-18 DIAGNOSIS — R350 Frequency of micturition: Secondary | ICD-10-CM | POA: Diagnosis not present

## 2019-03-18 DIAGNOSIS — E032 Hypothyroidism due to medicaments and other exogenous substances: Secondary | ICD-10-CM

## 2019-03-18 LAB — CBC WITH DIFFERENTIAL (CANCER CENTER ONLY)
Abs Immature Granulocytes: 0.03 10*3/uL (ref 0.00–0.07)
Basophils Absolute: 0.1 10*3/uL (ref 0.0–0.1)
Basophils Relative: 1 %
Eosinophils Absolute: 0.1 10*3/uL (ref 0.0–0.5)
Eosinophils Relative: 1 %
HCT: 34.2 % — ABNORMAL LOW (ref 39.0–52.0)
Hemoglobin: 10.9 g/dL — ABNORMAL LOW (ref 13.0–17.0)
Immature Granulocytes: 0 %
Lymphocytes Relative: 13 %
Lymphs Abs: 1.1 10*3/uL (ref 0.7–4.0)
MCH: 32.1 pg (ref 26.0–34.0)
MCHC: 31.9 g/dL (ref 30.0–36.0)
MCV: 100.6 fL — ABNORMAL HIGH (ref 80.0–100.0)
Monocytes Absolute: 0.7 10*3/uL (ref 0.1–1.0)
Monocytes Relative: 9 %
Neutro Abs: 6.3 10*3/uL (ref 1.7–7.7)
Neutrophils Relative %: 76 %
Platelet Count: 293 10*3/uL (ref 150–400)
RBC: 3.4 MIL/uL — ABNORMAL LOW (ref 4.22–5.81)
RDW: 16.5 % — ABNORMAL HIGH (ref 11.5–15.5)
WBC Count: 8.3 10*3/uL (ref 4.0–10.5)
nRBC: 0 % (ref 0.0–0.2)

## 2019-03-18 LAB — CMP (CANCER CENTER ONLY)
ALT: 9 U/L (ref 0–44)
AST: 12 U/L — ABNORMAL LOW (ref 15–41)
Albumin: 4.3 g/dL (ref 3.5–5.0)
Alkaline Phosphatase: 63 U/L (ref 38–126)
Anion gap: 8 (ref 5–15)
BUN: 24 mg/dL — ABNORMAL HIGH (ref 8–23)
CO2: 26 mmol/L (ref 22–32)
Calcium: 9.5 mg/dL (ref 8.9–10.3)
Chloride: 104 mmol/L (ref 98–111)
Creatinine: 1.15 mg/dL (ref 0.61–1.24)
GFR, Est AFR Am: >60 mL/min (ref >60)
GFR, Estimated: >60 mL/min (ref >60)
Glucose, Bld: 105 mg/dL — ABNORMAL HIGH (ref 70–99)
Potassium: 4.3 mmol/L (ref 3.5–5.1)
Sodium: 138 mmol/L (ref 135–145)
Total Bilirubin: 0.4 mg/dL (ref 0.3–1.2)
Total Protein: 6.7 g/dL (ref 6.5–8.1)

## 2019-03-18 LAB — IRON AND TIBC
Iron: 69 ug/dL (ref 42–163)
Saturation Ratios: 26 % (ref 20–55)
TIBC: 263 ug/dL (ref 202–409)
UIBC: 193 ug/dL (ref 117–376)

## 2019-03-18 LAB — TSH: TSH: 3.739 u[IU]/mL (ref 0.320–4.118)

## 2019-03-18 LAB — LACTATE DEHYDROGENASE: LDH: 195 U/L — ABNORMAL HIGH (ref 98–192)

## 2019-03-18 LAB — FERRITIN: Ferritin: 572 ng/mL — ABNORMAL HIGH (ref 24–336)

## 2019-03-18 MED ORDER — PEGFILGRASTIM 6 MG/0.6ML ~~LOC~~ PSKT
6.0000 mg | PREFILLED_SYRINGE | Freq: Once | SUBCUTANEOUS | Status: AC
  Administered 2019-03-18: 6 mg via SUBCUTANEOUS

## 2019-03-18 MED ORDER — SODIUM CHLORIDE 0.9% FLUSH
10.0000 mL | INTRAVENOUS | Status: DC | PRN
  Administered 2019-03-18: 10 mL
  Filled 2019-03-18: qty 10

## 2019-03-18 MED ORDER — HEPARIN SOD (PORK) LOCK FLUSH 100 UNIT/ML IV SOLN
500.0000 [IU] | Freq: Once | INTRAVENOUS | Status: AC | PRN
  Administered 2019-03-18: 500 [IU]
  Filled 2019-03-18: qty 5

## 2019-03-18 MED ORDER — SODIUM CHLORIDE 0.9 % IV SOLN
Freq: Once | INTRAVENOUS | Status: AC
  Filled 2019-03-18: qty 250

## 2019-03-18 MED ORDER — DEXAMETHASONE SODIUM PHOSPHATE 10 MG/ML IJ SOLN
INTRAMUSCULAR | Status: AC
  Filled 2019-03-18: qty 1

## 2019-03-18 MED ORDER — DEXAMETHASONE SODIUM PHOSPHATE 10 MG/ML IJ SOLN
10.0000 mg | Freq: Once | INTRAMUSCULAR | Status: AC
  Administered 2019-03-18: 10 mg via INTRAVENOUS

## 2019-03-18 MED ORDER — SODIUM CHLORIDE 0.9 % IV SOLN
80.0000 mg/m2 | Freq: Once | INTRAVENOUS | Status: AC
  Administered 2019-03-18: 150 mg via INTRAVENOUS
  Filled 2019-03-18: qty 15

## 2019-03-18 MED ORDER — PEGFILGRASTIM 6 MG/0.6ML ~~LOC~~ PSKT
PREFILLED_SYRINGE | SUBCUTANEOUS | Status: AC
  Filled 2019-03-18: qty 0.6

## 2019-03-18 MED ORDER — SODIUM CHLORIDE 0.9 % IV SOLN
Freq: Once | INTRAVENOUS | Status: DC
  Filled 2019-03-18: qty 250

## 2019-03-18 MED ORDER — PROCHLORPERAZINE MALEATE 10 MG PO TABS: 10.0000 mg | ORAL_TABLET | Freq: Once | ORAL | Status: DC

## 2019-03-18 MED ORDER — SODIUM CHLORIDE 0.9 % IV SOLN
200.0000 mg | Freq: Once | INTRAVENOUS | Status: AC
  Administered 2019-03-18: 200 mg via INTRAVENOUS
  Filled 2019-03-18: qty 8

## 2019-03-18 NOTE — Patient Instructions (Signed)
[facility] Discharge Instructions for Patients Receiving Chemotherapy  Today you received the following chemotherapy agents Taxotere and Keytruda To help prevent nausea and vomiting after your treatment, we encourage you to take your nausea medication as prescribed.   If you develop nausea and vomiting that is not controlled by your nausea medication, call the clinic.   BELOW ARE SYMPTOMS THAT SHOULD BE REPORTED IMMEDIATELY:  *FEVER GREATER THAN 100.5 F  *CHILLS WITH OR WITHOUT FEVER  NAUSEA AND VOMITING THAT IS NOT CONTROLLED WITH YOUR NAUSEA MEDICATION  *UNUSUAL SHORTNESS OF BREATH  *UNUSUAL BRUISING OR BLEEDING  TENDERNESS IN MOUTH AND THROAT WITH OR WITHOUT PRESENCE OF ULCERS  *URINARY PROBLEMS  *BOWEL PROBLEMS  UNUSUAL RASH Items with * indicate a potential emergency and should be followed up as soon as possible.  Feel free to call the clinic should you have any questions or concerns. The clinic phone number is [phone].  Please show the [facility] at check-in to the Emergency Department and triage nurse.

## 2019-03-18 NOTE — Progress Notes (Signed)
Hematology and Oncology Follow Up Visit  [name] [id number] February 13, 1945 74 y.o. 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 Iron def anemia -- malabsorption   Current Therapy:    Zytiga 1000 mg by mouth daily - discontinued on 08/15/2016  Xtandi 160 mg po q day - start on 08/15/2016 - discontinued  Xgeva 120 mg subcutaneous Q3 months - due in 04/2019  Lupron 22 mg IM every 3 months - due in 04/2019  Radium-223 therapy -- s/p cycle #6  Palliative radiation to the right sacrum  Lutathera - s/p cycle 4/4 --last dose on 07/11/2017  Carbo/VP-16/Tecentriq --  S/p cycle #5  Keytruda q 6 week -- Maintanence -- s/p cycle #3 - changed from 3 to 6 week on 06/30/2018  Taxotere/Keytruda -- start on 09/17/2018 -- s/p cycle #8  Injectafer 750 mg IV prn     Interim History:  [name] is back for follow-up.  So far, he is doing quite well.  He really has had no problems with the chemotherapy.  He has had no mouth sores.  He has had no bleeding.  There is no cough or shortness of breath.  He has had no significant nausea or vomiting.  He has had no problems with bowels or bladder.  He is on Vesicare.  He does urinate quite a bit.  He has had no hematuria.  There is been no headache.  He has had no visual changes.  He has had no fever..  His performance status is ECOG 1.       Medications:  Current Outpatient Medications:  .  acetaminophen (TYLENOL) 500 MG tablet, Take 1,000 mg by mouth every 4 (four) hours as needed for moderate pain., Disp: , Rfl:  .  aspirin 81 MG tablet, Take 81 mg by mouth daily., Disp: , Rfl:  .  Calcium Carbonate-Vitamin D (OYSTER SHELL CALCIUM/D) 250-125 MG-UNIT TABS, Take by mouth., Disp: , Rfl:  .  CALCIUM-IRON-VIT D-VIT K PO, Take 1 tablet by mouth 2 (two) times daily., Disp: , Rfl:  .  dexamethasone (DECADRON) 4 MG tablet, Take 8 mg by mouth 2  (two) times daily. Takes day of chemo, and 2 days afterwards., Disp: , Rfl:  .  docusate sodium (COLACE) 100 MG capsule, Take 200 mg by mouth 2 (two) times daily., Disp: , Rfl:  .  gabapentin (NEURONTIN) 300 MG capsule, TAKE 1 CAPSULE BY MOUTH TWICE A DAY THEN TAKE 2 CAPSULES AT BEDTIME (Patient taking differently: Take 300 mg by mouth 2 (two) times daily. ), Disp: 120 capsule, Rfl: 4 .  isosorbide mononitrate (IMDUR) 30 MG 24 hr tablet, Take 30 mg by mouth daily., Disp: , Rfl:  .  lidocaine-prilocaine (EMLA) cream, , Disp: , Rfl:  .  nitroGLYCERIN (NITROSTAT) 0.4 MG SL tablet, Place 1 tablet (0.4 mg total) under the tongue every 5 (five) minutes as needed for chest pain., Disp: 25 tablet, Rfl: 11 .  ondansetron (ZOFRAN) 8 MG tablet, Take 1 tablet (8 mg total) by mouth 2 (two) times daily as needed for nausea or vomiting., Disp: 20 tablet, Rfl: 0 .  OVER THE COUNTER MEDICATION, every morning. Juice Plus -- 8 capsule of each the garden, vineyard, and orchard twice a day., Disp: , Rfl:  .  pantoprazole (PROTONIX) 40 MG tablet, Take 1 tablet (40 mg total) by mouth at bedtime., Disp: 30 tablet, Rfl: 6 .  prochlorperazine (COMPAZINE) 10  MG tablet, Take 10 mg by mouth every 6 (six) hours as needed (Nausea or vomiting)., Disp: , Rfl:  .  solifenacin (VESICARE) 10 MG tablet, Take 1 tablet (10 mg total) by mouth daily., Disp: 30 tablet, Rfl: 5 .  ZETIA 10 MG tablet, Take 10 mg by mouth daily. , Disp: , Rfl:  .  isosorbide mononitrate (IMDUR) 30 MG 24 hr tablet, TAKE 1 TABLET (30 MG TOTAL) BY MOUTH DAILY., Disp: 90 tablet, Rfl: 1 .  sucralfate (CARAFATE) 1 GM/10ML suspension, Take 10 mLs (1 g total) by mouth 4 (four) times daily -  with meals and at bedtime. (Patient not taking: Reported on 03/18/2019), Disp: 420 mL, Rfl: 5 No current facility-administered medications for this visit.  Facility-Administered Medications Ordered in Other Visits:  .  octreotide (SANDOSTATIN LAR) IM injection 30 mg, 30 mg,  Intramuscular, Once, Meril Dray, Rudell Cobb, MD  Allergies:  Allergies  Allergen Reactions  . Codeine Nausea And Vomiting  . Hydrocodone Nausea And Vomiting    Past Medical History, Surgical history, Social history, and Family History were reviewed and updated.  Review of Systems: Review of Systems  Constitutional: Negative.   HENT: Negative.   Eyes: Negative.   Respiratory: Negative.   Cardiovascular: Negative.   Gastrointestinal: Negative.   Genitourinary: Negative.  Negative for urgency.  Musculoskeletal: Positive for joint pain.  Skin: Negative.   Neurological: Positive for focal weakness.  Endo/Heme/Allergies: Negative.   Psychiatric/Behavioral: Negative.   All other systems reviewed and are negative.    Physical Exam:  weight is 152 lb (68.9 kg). His temporal temperature is 97.3 F (36.3 C) (abnormal). His blood pressure is 111/70 and his pulse is 98. His respiration is 16 and oxygen saturation is 99%. PET scan  Physical Exam Vitals reviewed.  HENT:     Head: Normocephalic and atraumatic.  Eyes:     Pupils: Pupils are equal, round, and reactive to light.  Cardiovascular:     Rate and Rhythm: Normal rate and regular rhythm.     Heart sounds: Normal heart sounds.  Pulmonary:     Effort: Pulmonary effort is normal.     Breath sounds: Normal breath sounds.  Abdominal:     General: Bowel sounds are normal.     Palpations: Abdomen is soft.  Musculoskeletal:        General: No tenderness or deformity. Normal range of motion.     Cervical back: Normal range of motion.  Lymphadenopathy:     Cervical: No cervical adenopathy.  Skin:    General: Skin is warm and dry.     Findings: No erythema or rash.  Neurological:     Mental Status: He is alert and oriented to person, place, and time.  Psychiatric:        Behavior: Behavior normal.        Thought Content: Thought content normal.        Judgment: Judgment normal.      Lab Results  Component Value Date   WBC  8.3 03/18/2019   HGB 10.9 (L) 03/18/2019   HCT 34.2 (L) 03/18/2019   MCV 100.6 (H) 03/18/2019   PLT 293 03/18/2019     Chemistry      Component Value Date/Time   NA 138 03/18/2019 0930   NA 144 12/14/2016 1159   NA 140 10/22/2016 1129   K 4.3 03/18/2019 0930   K 3.5 12/14/2016 1159   K 4.3 10/22/2016 1129   CL 104 03/18/2019 0930   CL  103 12/14/2016 1159   CO2 26 03/18/2019 0930   CO2 30 12/14/2016 1159   CO2 27 10/22/2016 1129   BUN 24 (H) 03/18/2019 0930   BUN 16 12/14/2016 1159   BUN 14.0 10/22/2016 1129   CREATININE 1.15 03/18/2019 0930   CREATININE 1.3 (H) 12/14/2016 1159   CREATININE 1.1 10/22/2016 1129      Component Value Date/Time   CALCIUM 9.5 03/18/2019 0930   CALCIUM 10.0 12/14/2016 1159   CALCIUM 10.4 10/22/2016 1129   ALKPHOS 63 03/18/2019 0930   ALKPHOS 48 12/14/2016 1159   ALKPHOS 60 10/22/2016 1129   AST 12 (L) 03/18/2019 0930   AST 16 10/22/2016 1129   ALT 9 03/18/2019 0930   ALT 23 12/14/2016 1159   ALT 14 10/22/2016 1129   BILITOT 0.4 03/18/2019 0930   BILITOT 0.31 10/22/2016 1129      Impression and Plan: [name] is 73 year old gentleman with 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  We will go ahead with his 9th cycle of treatment.    He will be due for another PET scan before we see him back.  I will see about getting this set up.  We will have to see what his iron studies look like.  His hemoglobin is little bit lower.  Thankfully, his quality of life is doing well which is a blessing.    [name], MD 3/3/202110:17 AM

## 2019-03-19 LAB — CHROMOGRANIN A: Chromogranin A (ng/mL): 614.5 ng/mL — ABNORMAL HIGH (ref 0.0–101.8)

## 2019-03-23 MED FILL — PANTOPRAZOLE SOD DR 40 MG T: 40 | 30 days supply | Qty: 30 | Fill #3

## 2019-03-23 MED FILL — ISOSORBIDE MN ER 30 MG TAB: 30 | 90 days supply | Qty: 90 | Fill #1

## 2019-03-23 MED FILL — SOLIFENACIN SUCCINATE 10 MG: 10 | 30 days supply | Qty: 30 | Fill #5

## 2019-04-07 ENCOUNTER — Ambulatory Visit (HOSPITAL_COMMUNITY)
Admission: RE | Admit: 2019-04-07 | Discharge: 2019-04-07 | Disposition: A | Discharge status: Home / Self Care | Payer: Medicare Other | Source: Ambulatory Visit | Attending: Hematology & Oncology | Admitting: Hematology & Oncology

## 2019-04-07 ENCOUNTER — Other Ambulatory Visit: Payer: Self-pay

## 2019-04-07 DIAGNOSIS — C7800 Secondary malignant neoplasm of unspecified lung: Secondary | ICD-10-CM | POA: Diagnosis not present

## 2019-04-07 DIAGNOSIS — C7A8 Other malignant neuroendocrine tumors: Secondary | ICD-10-CM | POA: Insufficient documentation

## 2019-04-07 MED ORDER — GALLIUM GA 68 DOTATATE IV KIT: 3.8000 | PACK | Freq: Once | INTRAVENOUS | Status: DC | PRN

## 2019-04-08 ENCOUNTER — Inpatient Hospital Stay: Payer: Medicare Other

## 2019-04-08 ENCOUNTER — Other Ambulatory Visit: Payer: Self-pay | Admitting: Pharmacist

## 2019-04-08 ENCOUNTER — Other Ambulatory Visit: Payer: Self-pay

## 2019-04-08 ENCOUNTER — Encounter: Payer: Self-pay | Admitting: Hematology & Oncology

## 2019-04-08 ENCOUNTER — Inpatient Hospital Stay (HOSPITAL_BASED_OUTPATIENT_CLINIC_OR_DEPARTMENT_OTHER): Payer: Medicare Other | Admitting: Hematology & Oncology

## 2019-04-08 VITALS — BP 123/70 | HR 82 | Temp 96.6°F | Wt 153.0 lb

## 2019-04-08 DIAGNOSIS — C7A8 Other malignant neuroendocrine tumors: Secondary | ICD-10-CM

## 2019-04-08 DIAGNOSIS — C7801 Secondary malignant neoplasm of right lung: Secondary | ICD-10-CM | POA: Diagnosis not present

## 2019-04-08 DIAGNOSIS — Z5189 Encounter for other specified aftercare: Secondary | ICD-10-CM | POA: Diagnosis not present

## 2019-04-08 DIAGNOSIS — Z5111 Encounter for antineoplastic chemotherapy: Secondary | ICD-10-CM | POA: Diagnosis not present

## 2019-04-08 DIAGNOSIS — Z5112 Encounter for antineoplastic immunotherapy: Secondary | ICD-10-CM | POA: Diagnosis not present

## 2019-04-08 DIAGNOSIS — C61 Malignant neoplasm of prostate: Secondary | ICD-10-CM | POA: Diagnosis not present

## 2019-04-08 DIAGNOSIS — C7951 Secondary malignant neoplasm of bone: Secondary | ICD-10-CM

## 2019-04-08 LAB — FERRITIN: Ferritin: 487 ng/mL — ABNORMAL HIGH (ref 24–336)

## 2019-04-08 LAB — IRON AND TIBC
Iron: 70 ug/dL (ref 42–163)
Saturation Ratios: 27 % (ref 20–55)
TIBC: 261 ug/dL (ref 202–409)
UIBC: 191 ug/dL (ref 117–376)

## 2019-04-08 LAB — CMP (CANCER CENTER ONLY)
ALT: 8 U/L (ref 0–44)
AST: 12 U/L — ABNORMAL LOW (ref 15–41)
Albumin: 4.2 g/dL (ref 3.5–5.0)
Alkaline Phosphatase: 64 U/L (ref 38–126)
Anion gap: 8 (ref 5–15)
BUN: 19 mg/dL (ref 8–23)
CO2: 25 mmol/L (ref 22–32)
Calcium: 9.3 mg/dL (ref 8.9–10.3)
Chloride: 104 mmol/L (ref 98–111)
Creatinine: 1.28 mg/dL — ABNORMAL HIGH (ref 0.61–1.24)
GFR, Est AFR Am: >60 mL/min (ref >60)
GFR, Estimated: 56 mL/min — ABNORMAL LOW (ref >60)
Glucose, Bld: 125 mg/dL — ABNORMAL HIGH (ref 70–99)
Potassium: 4.1 mmol/L (ref 3.5–5.1)
Sodium: 137 mmol/L (ref 135–145)
Total Bilirubin: 0.4 mg/dL (ref 0.3–1.2)
Total Protein: 6.5 g/dL (ref 6.5–8.1)

## 2019-04-08 LAB — CBC WITH DIFFERENTIAL (CANCER CENTER ONLY)
Abs Immature Granulocytes: 0.17 10*3/uL — ABNORMAL HIGH (ref 0.00–0.07)
Basophils Absolute: 0.1 10*3/uL (ref 0.0–0.1)
Basophils Relative: 1 %
Eosinophils Absolute: 0.1 10*3/uL (ref 0.0–0.5)
Eosinophils Relative: 1 %
HCT: 35 % — ABNORMAL LOW (ref 39.0–52.0)
Hemoglobin: 11 g/dL — ABNORMAL LOW (ref 13.0–17.0)
Immature Granulocytes: 2 %
Lymphocytes Relative: 10 %
Lymphs Abs: 1.2 10*3/uL (ref 0.7–4.0)
MCH: 32 pg (ref 26.0–34.0)
MCHC: 31.4 g/dL (ref 30.0–36.0)
MCV: 101.7 fL — ABNORMAL HIGH (ref 80.0–100.0)
Monocytes Absolute: 0.6 10*3/uL (ref 0.1–1.0)
Monocytes Relative: 5 %
Neutro Abs: 9.4 10*3/uL — ABNORMAL HIGH (ref 1.7–7.7)
Neutrophils Relative %: 81 %
Platelet Count: 331 10*3/uL (ref 150–400)
RBC: 3.44 MIL/uL — ABNORMAL LOW (ref 4.22–5.81)
RDW: 16.6 % — ABNORMAL HIGH (ref 11.5–15.5)
WBC Count: 11.6 10*3/uL — ABNORMAL HIGH (ref 4.0–10.5)
nRBC: 0 % (ref 0.0–0.2)

## 2019-04-08 LAB — RETICULOCYTES
Immature Retic Fract: 12 % (ref 2.3–15.9)
RBC.: 3.42 MIL/uL — ABNORMAL LOW (ref 4.22–5.81)
Retic Count, Absolute: 54.7 10*3/uL (ref 19.0–186.0)
Retic Ct Pct: 1.6 % (ref 0.4–3.1)

## 2019-04-08 MED ORDER — PEGFILGRASTIM 6 MG/0.6ML ~~LOC~~ PSKT
6.0000 mg | PREFILLED_SYRINGE | Freq: Once | SUBCUTANEOUS | Status: AC
  Administered 2019-04-08: 6 mg via SUBCUTANEOUS

## 2019-04-08 MED ORDER — SODIUM CHLORIDE 0.9 % IV SOLN
80.0000 mg/m2 | Freq: Once | INTRAVENOUS | Status: AC
  Administered 2019-04-08: 150 mg via INTRAVENOUS
  Filled 2019-04-08: qty 15

## 2019-04-08 MED ORDER — DEXAMETHASONE SODIUM PHOSPHATE 10 MG/ML IJ SOLN
10.0000 mg | Freq: Once | INTRAMUSCULAR | Status: AC
  Administered 2019-04-08: 10 mg via INTRAVENOUS

## 2019-04-08 MED ORDER — HEPARIN SOD (PORK) LOCK FLUSH 100 UNIT/ML IV SOLN
500.0000 [IU] | Freq: Once | INTRAVENOUS | Status: AC | PRN
  Administered 2019-04-08: 500 [IU]
  Filled 2019-04-08: qty 5

## 2019-04-08 MED ORDER — PEGFILGRASTIM 6 MG/0.6ML ~~LOC~~ PSKT
PREFILLED_SYRINGE | SUBCUTANEOUS | Status: AC
  Filled 2019-04-08: qty 0.6

## 2019-04-08 MED ORDER — PROCHLORPERAZINE MALEATE 10 MG PO TABS
ORAL_TABLET | ORAL | Status: AC
  Filled 2019-04-08: qty 1

## 2019-04-08 MED ORDER — SODIUM CHLORIDE 0.9 % IV SOLN
200.0000 mg | Freq: Once | INTRAVENOUS | Status: AC
  Administered 2019-04-08: 200 mg via INTRAVENOUS
  Filled 2019-04-08: qty 8

## 2019-04-08 MED ORDER — SODIUM CHLORIDE 0.9 % IV SOLN
Freq: Once | INTRAVENOUS | Status: AC
  Filled 2019-04-08: qty 250

## 2019-04-08 MED ORDER — SODIUM CHLORIDE 0.9% FLUSH
10.0000 mL | INTRAVENOUS | Status: DC | PRN
  Administered 2019-04-08: 10 mL
  Filled 2019-04-08: qty 10

## 2019-04-08 MED ORDER — PROCHLORPERAZINE MALEATE 10 MG PO TABS
10.0000 mg | ORAL_TABLET | Freq: Once | ORAL | Status: AC
  Administered 2019-04-08: 10 mg via ORAL

## 2019-04-08 NOTE — Progress Notes (Signed)
DISCONTINUE OFF PATHWAY REGIMEN - Small Cell Lung   OFF00006:Docetaxel (100 mg/m2):   A cycle is every 21 days:     Docetaxel   **Always confirm dose/schedule in your pharmacy ordering system**  REASON: Disease Progression PRIOR TREATMENT: Off Pathway: Docetaxel (100 mg/m2) TREATMENT RESPONSE: Partial Response (PR)  START OFF PATHWAY REGIMEN - Small Cell Lung   [id number]:Lurbinectedin 3.2 mg/m2 IV D1 q21 Days:   A cycle is every 21 days:     Lurbinectedin   **Always confirm dose/schedule in your pharmacy ordering system**  Patient Characteristics: Relapsed or Progressive Disease, Third Line and Beyond Therapeutic Status: Relapsed or Progressive Disease Line of Therapy: Third Line and Beyond  Intent of Therapy: Non-Curative / Palliative Intent, Discussed with Patient

## 2019-04-08 NOTE — Patient Instructions (Signed)
[facility] Discharge Instructions for Patients Receiving Chemotherapy  Today you received the following chemotherapy agents Taxotere, Keytruda  To help prevent nausea and vomiting after your treatment, we encourage you to take your nausea medication    If you develop nausea and vomiting that is not controlled by your nausea medication, call the clinic.   BELOW ARE SYMPTOMS THAT SHOULD BE REPORTED IMMEDIATELY:  *FEVER GREATER THAN 100.5 F  *CHILLS WITH OR WITHOUT FEVER  NAUSEA AND VOMITING THAT IS NOT CONTROLLED WITH YOUR NAUSEA MEDICATION  *UNUSUAL SHORTNESS OF BREATH  *UNUSUAL BRUISING OR BLEEDING  TENDERNESS IN MOUTH AND THROAT WITH OR WITHOUT PRESENCE OF ULCERS  *URINARY PROBLEMS  *BOWEL PROBLEMS  UNUSUAL RASH Items with * indicate a potential emergency and should be followed up as soon as possible.  Feel free to call the clinic should you have any questions or concerns. The clinic phone number is [phone].  Please show the [facility] at check-in to the Emergency Department and triage nurse.

## 2019-04-08 NOTE — Patient Instructions (Signed)
Implanted Port Insertion, Care After °This sheet gives you information about how to care for yourself after your procedure. Your health care provider may also give you more specific instructions. If you have problems or questions, contact your health care provider. °What can I expect after the procedure? °After the procedure, it is common to have: °· Discomfort at the port insertion site. °· Bruising on the skin over the port. This should improve over 3-4 days. °Follow these instructions at home: °Port care °· After your port is placed, you will get a manufacturer's information card. The card has information about your port. Keep this card with you at all times. °· Take care of the port as told by your health care provider. Ask your health care provider if you or a family member can get training for taking care of the port at home. A home health care nurse may also take care of the port. °· Make sure to remember what type of port you have. °Incision care ° °  ° °· Follow instructions from your health care provider about how to take care of your port insertion site. Make sure you: °? Wash your hands with soap and water before and after you change your bandage (dressing). If soap and water are not available, use hand sanitizer. °? Change your dressing as told by your health care provider. °? Leave stitches (sutures), skin glue, or adhesive strips in place. These skin closures may need to stay in place for 2 weeks or longer. If adhesive strip edges start to loosen and curl up, you may trim the loose edges. Do not remove adhesive strips completely unless your health care provider tells you to do that. °· Check your port insertion site every day for signs of infection. Check for: °? Redness, swelling, or pain. °? Fluid or blood. °? Warmth. °? Pus or a bad smell. °Activity °· Return to your normal activities as told by your health care provider. Ask your health care provider what activities are safe for you. °· Do not  lift anything that is heavier than 10 lb (4.5 kg), or the limit that you are told, until your health care provider says that it is safe. °General instructions °· Take over-the-counter and prescription medicines only as told by your health care provider. °· Do not take baths, swim, or use a hot tub until your health care provider approves. Ask your health care provider if you may take showers. You may only be allowed to take sponge baths. °· Do not drive for 24 hours if you were given a sedative during your procedure. °· Wear a medical alert bracelet in case of an emergency. This will tell any health care providers that you have a port. °· Keep all follow-up visits as told by your health care provider. This is important. °Contact a health care provider if: °· You cannot flush your port with saline as directed, or you cannot draw blood from the port. °· You have a fever or chills. °· You have redness, swelling, or pain around your port insertion site. °· You have fluid or blood coming from your port insertion site. °· Your port insertion site feels warm to the touch. °· You have pus or a bad smell coming from the port insertion site. °Get help right away if: °· You have chest pain or shortness of breath. °· You have bleeding from your port that you cannot control. °Summary °· Take care of the port as told by your health   care provider. Keep the manufacturer's information card with you at all times. °· Change your dressing as told by your health care provider. °· Contact a health care provider if you have a fever or chills or if you have redness, swelling, or pain around your port insertion site. °· Keep all follow-up visits as told by your health care provider. °This information is not intended to replace advice given to you by your health care provider. Make sure you discuss any questions you have with your health care provider. °Document Revised: 07/30/2017 Document Reviewed: 07/30/2017 °Elsevier Patient Education ©  2020 [employer] ° °

## 2019-04-08 NOTE — Progress Notes (Signed)
Hematology and Oncology Follow Up Visit  [name] [id number] Jun 15, 1945 74 y.o. 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 Iron def anemia -- malabsorption   Current Therapy:    Zytiga 1000 mg by mouth daily - discontinued on 08/15/2016  Xtandi 160 mg po q day - start on 08/15/2016 - discontinued  Xgeva 120 mg subcutaneous Q3 months - due in 04/2019  Lupron 22 mg IM every 3 months - due in 04/2019  Radium-223 therapy -- s/p cycle #6  Palliative radiation to the right sacrum  Lutathera - s/p cycle 4/4 --last dose on 07/11/2017  Carbo/VP-16/Tecentriq --  S/p cycle #5  Keytruda q 6 week -- Maintanence -- s/p cycle #3 - changed from 3 to 6 week on 06/30/2018  Taxotere/Keytruda -- start on 09/17/2018 -- s/p cycle #10 -- d/c due to progression  Lurbinectedin -- cycle #1 to start on 04/29/2019  Injectafer 750 mg IV prn     Interim History:  [name] is back for follow-up.  As always, he looks quite good.  He really has no specific complaints.  He is still having some urinary frequency.  I do think he is having the hematuria that he had before.  We did do a PET scan on him.  This was one of the dotatate PET scans.  This was done on 04/07/2019.  Unfortunate, this did seem to show some progressive disease.  He had no new lesions.  What was in his lungs were slightly larger and a little bit more active with SUV measurements.  I think that we are coming close to the end of the utility of Taxotere.  We will go ahead and treat him today since there are no new lesions.  However, they were probably going to have to switch treatments on him.  He has had a good appetite.  He and his family were able to make it to [place].  That a wonderful time.  He has had no bleeding.  He has had no headache.  He has had no cough or shortness of breath.  He has had no  nausea or vomiting.  Overall, his blood sugars have been doing quite well.  He has the brace on his right lower leg.  He has little numbness in the toes.  His left leg is doing okay.  I would have to say that his performance status is ECOG 1.       Medications:  Current Outpatient Medications:  .  acetaminophen (TYLENOL) 500 MG tablet, Take 1,000 mg by mouth every 4 (four) hours as needed for moderate pain., Disp: , Rfl:  .  aspirin 81 MG tablet, Take 81 mg by mouth daily., Disp: , Rfl:  .  Calcium Carbonate-Vitamin D (OYSTER SHELL CALCIUM/D) 250-125 MG-UNIT TABS, Take by mouth., Disp: , Rfl:  .  CALCIUM-IRON-VIT D-VIT K PO, Take 1 tablet by mouth 2 (two) times daily., Disp: , Rfl:  .  dexamethasone (DECADRON) 4 MG tablet, Take 8 mg by mouth 2 (two) times daily. Takes day of chemo, and 2 days afterwards., Disp: , Rfl:  .  docusate sodium (COLACE) 100 MG capsule, Take 200 mg by mouth 2 (two) times daily., Disp: , Rfl:  .  gabapentin (NEURONTIN) 300 MG capsule, TAKE 1 CAPSULE BY MOUTH TWICE A DAY THEN TAKE 2 CAPSULES AT BEDTIME (Patient taking differently: Take 300 mg by mouth 2 (two)  times daily. ), Disp: 120 capsule, Rfl: 4 .  isosorbide mononitrate (IMDUR) 30 MG 24 hr tablet, Take 30 mg by mouth daily., Disp: , Rfl:  .  lidocaine-prilocaine (EMLA) cream, , Disp: , Rfl:  .  nitroGLYCERIN (NITROSTAT) 0.4 MG SL tablet, Place 1 tablet (0.4 mg total) under the tongue every 5 (five) minutes as needed for chest pain., Disp: 25 tablet, Rfl: 11 .  ondansetron (ZOFRAN) 8 MG tablet, Take 1 tablet (8 mg total) by mouth 2 (two) times daily as needed for nausea or vomiting., Disp: 20 tablet, Rfl: 0 .  OVER THE COUNTER MEDICATION, every morning. Juice Plus -- 8 capsule of each the garden, vineyard, and orchard twice a day., Disp: , Rfl:  .  pantoprazole (PROTONIX) 40 MG tablet, Take 1 tablet (40 mg total) by mouth at bedtime., Disp: 30 tablet, Rfl: 6 .  prochlorperazine (COMPAZINE) 10 MG tablet, Take 10 mg  by mouth every 6 (six) hours as needed (Nausea or vomiting)., Disp: , Rfl:  .  solifenacin (VESICARE) 10 MG tablet, Take 1 tablet (10 mg total) by mouth daily., Disp: 30 tablet, Rfl: 5 .  sucralfate (CARAFATE) 1 GM/10ML suspension, Take 10 mLs (1 g total) by mouth 4 (four) times daily -  with meals and at bedtime., Disp: 420 mL, Rfl: 5 .  ZETIA 10 MG tablet, Take 10 mg by mouth daily. , Disp: , Rfl:  .  isosorbide mononitrate (IMDUR) 30 MG 24 hr tablet, TAKE 1 TABLET (30 MG TOTAL) BY MOUTH DAILY., Disp: 90 tablet, Rfl: 1 No current facility-administered medications for this visit.  Facility-Administered Medications Ordered in Other Visits:  .  Gallium Ga 68 Dotatate (NETSPOT) KIT 3.8 millicurie, 3.8 millicurie, Intravenous, Once PRN, Wile, Geoffrey E, MD .  octreotide (SANDOSTATIN LAR) IM injection 30 mg, 30 mg, Intramuscular, Once, Clariza Sickman R, MD .  sodium chloride flush (NS) 0.9 % injection 10 mL, 10 mL, Intracatheter, PRN, [name], MD, 10 mL at 04/08/19 1346  Allergies:  Allergies  Allergen Reactions  . Codeine Nausea And Vomiting  . Hydrocodone Nausea And Vomiting    Past Medical History, Surgical history, Social history, and Family History were reviewed and updated.  Review of Systems: Review of Systems  Constitutional: Negative.   HENT: Negative.   Eyes: Negative.   Respiratory: Negative.   Cardiovascular: Negative.   Gastrointestinal: Negative.   Genitourinary: Negative.  Negative for urgency.  Musculoskeletal: Positive for joint pain.  Skin: Negative.   Neurological: Positive for focal weakness.  Endo/Heme/Allergies: Negative.   Psychiatric/Behavioral: Negative.   All other systems reviewed and are negative.    Physical Exam:  weight is 153 lb (69.4 kg). His oral temperature is 96.6 F (35.9 C) (abnormal). His blood pressure is 123/70 and his pulse is 82. His oxygen saturation is 100%. PET scan  Physical Exam Vitals reviewed.  HENT:     Head:  Normocephalic and atraumatic.  Eyes:     Pupils: Pupils are equal, round, and reactive to light.  Cardiovascular:     Rate and Rhythm: Normal rate and regular rhythm.     Heart sounds: Normal heart sounds.  Pulmonary:     Effort: Pulmonary effort is normal.     Breath sounds: Normal breath sounds.  Abdominal:     General: Bowel sounds are normal.     Palpations: Abdomen is soft.  Musculoskeletal:        General: No tenderness or deformity. Normal range of motion.  Cervical back: Normal range of motion.     Comments: His extremities shows the brace on the right lower leg.  He has some muscle atrophy in the right lower leg.  Left leg is unremarkable.  He has good range of motion of his joints.  He has good pulses in his distal extremities.  Lymphadenopathy:     Cervical: No cervical adenopathy.  Skin:    General: Skin is warm and dry.     Findings: No erythema or rash.  Neurological:     Mental Status: He is alert and oriented to person, place, and time.  Psychiatric:        Behavior: Behavior normal.        Thought Content: Thought content normal.        Judgment: Judgment normal.     Lab Results  Component Value Date   WBC 11.6 (H) 04/08/2019   HGB 11.0 (L) 04/08/2019   HCT 35.0 (L) 04/08/2019   MCV 101.7 (H) 04/08/2019   PLT 331 04/08/2019     Chemistry      Component Value Date/Time   NA 137 04/08/2019 0949   NA 144 12/14/2016 1159   NA 140 10/22/2016 1129   K 4.1 04/08/2019 0949   K 3.5 12/14/2016 1159   K 4.3 10/22/2016 1129   CL 104 04/08/2019 0949   CL 103 12/14/2016 1159   CO2 25 04/08/2019 0949   CO2 30 12/14/2016 1159   CO2 27 10/22/2016 1129   BUN 19 04/08/2019 0949   BUN 16 12/14/2016 1159   BUN 14.0 10/22/2016 1129   CREATININE 1.28 (H) 04/08/2019 0949   CREATININE 1.3 (H) 12/14/2016 1159   CREATININE 1.1 10/22/2016 1129      Component Value Date/Time   CALCIUM 9.3 04/08/2019 0949   CALCIUM 10.0 12/14/2016 1159   CALCIUM 10.4 10/22/2016  1129   ALKPHOS 64 04/08/2019 0949   ALKPHOS 48 12/14/2016 1159   ALKPHOS 60 10/22/2016 1129   AST 12 (L) 04/08/2019 0949   AST 16 10/22/2016 1129   ALT 8 04/08/2019 0949   ALT 23 12/14/2016 1159   ALT 14 10/22/2016 1129   BILITOT 0.4 04/08/2019 0949   BILITOT 0.31 10/22/2016 1129      Impression and Plan: [name] is 74 year old gentleman with 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  After this 10th cycle of treatment, we will make this change.  I will go ahead and try him on the new agent for small cell lung cancer/neuroendocrine carcinoma - lurbinectedin (Zepzelca).  I think this would be reasonable to use with him.  We will see about getting this started in about 3 weeks or so.  I do not see any contraindication to using the Zepzelca.  I think he could tolerate this.  He really has done quite well.  His quality of life is nice and high.  This is what is most important to him.  I know that he will have a wonderful Easter weekend.  He hopefully will be able to go to church.  I know that his faith remains strong.      [name], MD 3/24/20212:10 PM

## 2019-04-22 NOTE — Progress Notes (Signed)
Pharmacist Chemotherapy Monitoring - Initial Assessment    Anticipated start date:04/29/19  Regimen:  . Are orders appropriate based on the patient's diagnosis, regimen, and cycle? Yes . Does the plan date match the patient's scheduled date? Yes . Is the sequencing of drugs appropriate? Yes . Are the premedications appropriate for the patient's regimen? Yes . Prior Authorization for treatment is: Approved o If applicable, is the correct biosimilar selected based on the patient's insurance? not applicable  Organ Function and Labs: Marland Kitchen Are dose adjustments needed based on the patient's renal function, hepatic function, or hematologic function? Yes . Are appropriate labs ordered prior to the start of patient's treatment? Yes . Other organ system assessment, if indicated: N/A . The following baseline labs, if indicated, have been ordered: N/A  Dose Assessment: . Are the drug doses appropriate? Yes . Are the following correct: o Drug concentrations Yes o IV fluid compatible with drug Yes o Administration routes Yes o Timing of therapy Yes . If applicable, does the patient have documented access for treatment and/or plans for port-a-cath placement? yes . If applicable, have lifetime cumulative doses been properly documented and assessed? not applicable Lifetime Dose Tracking  . Carboplatin: 1,610 mg = 0.01 % of the maximum lifetime dose of 999,999,999 mg  o   Toxicity Monitoring/Prevention: . The patient has the following take home antiemetics prescribed: Ondansetron and Prochlorperazine . The patient has the following take home medications prescribed: N/A . Medication allergies and previous infusion related reactions, if applicable, have been reviewed and addressed. Yes . The patient's current medication list has been assessed for drug-drug interactions with their chemotherapy regimen. no significant drug-drug interactions were identified on review.  Order Review: . Are the treatment  plan orders signed? Yes . Is the patient scheduled to see a provider prior to their treatment? Yes  I verify that I have reviewed each item in the above checklist and answered each question accordingly.  Romualdo Bolk [facility] 04/22/2019 8:08 AM

## 2019-04-23 ENCOUNTER — Other Ambulatory Visit: Payer: Self-pay | Admitting: Hematology & Oncology

## 2019-04-23 MED FILL — SOLIFENACIN SUCCINATE 10 MG: 10 | 30 days supply | Qty: 30 | Fill #0

## 2019-04-29 ENCOUNTER — Other Ambulatory Visit: Payer: Self-pay | Admitting: Family

## 2019-04-29 ENCOUNTER — Other Ambulatory Visit: Payer: Self-pay

## 2019-04-29 ENCOUNTER — Inpatient Hospital Stay: Payer: Medicare Other

## 2019-04-29 ENCOUNTER — Inpatient Hospital Stay: Payer: Medicare Other | Attending: Hematology & Oncology | Admitting: Hematology & Oncology

## 2019-04-29 ENCOUNTER — Other Ambulatory Visit: Payer: Self-pay | Admitting: Hematology & Oncology

## 2019-04-29 ENCOUNTER — Encounter: Payer: Self-pay | Admitting: Hematology & Oncology

## 2019-04-29 ENCOUNTER — Telehealth: Payer: Self-pay | Admitting: Hematology & Oncology

## 2019-04-29 VITALS — BP 141/76 | HR 92 | Temp 97.8°F | Resp 18 | Ht 68.0 in | Wt 151.0 lb

## 2019-04-29 DIAGNOSIS — D509 Iron deficiency anemia, unspecified: Secondary | ICD-10-CM | POA: Insufficient documentation

## 2019-04-29 DIAGNOSIS — C7801 Secondary malignant neoplasm of right lung: Secondary | ICD-10-CM | POA: Insufficient documentation

## 2019-04-29 DIAGNOSIS — M2550 Pain in unspecified joint: Secondary | ICD-10-CM | POA: Insufficient documentation

## 2019-04-29 DIAGNOSIS — D500 Iron deficiency anemia secondary to blood loss (chronic): Secondary | ICD-10-CM | POA: Diagnosis not present

## 2019-04-29 DIAGNOSIS — Z79899 Other long term (current) drug therapy: Secondary | ICD-10-CM | POA: Insufficient documentation

## 2019-04-29 DIAGNOSIS — Z5111 Encounter for antineoplastic chemotherapy: Secondary | ICD-10-CM | POA: Diagnosis not present

## 2019-04-29 DIAGNOSIS — C7951 Secondary malignant neoplasm of bone: Secondary | ICD-10-CM

## 2019-04-29 DIAGNOSIS — C7802 Secondary malignant neoplasm of left lung: Secondary | ICD-10-CM | POA: Diagnosis not present

## 2019-04-29 DIAGNOSIS — C61 Malignant neoplasm of prostate: Secondary | ICD-10-CM | POA: Diagnosis not present

## 2019-04-29 DIAGNOSIS — C7A8 Other malignant neuroendocrine tumors: Secondary | ICD-10-CM | POA: Diagnosis not present

## 2019-04-29 DIAGNOSIS — Z885 Allergy status to narcotic agent status: Secondary | ICD-10-CM | POA: Insufficient documentation

## 2019-04-29 DIAGNOSIS — C7B8 Other secondary neuroendocrine tumors: Secondary | ICD-10-CM | POA: Diagnosis not present

## 2019-04-29 DIAGNOSIS — K909 Intestinal malabsorption, unspecified: Secondary | ICD-10-CM | POA: Insufficient documentation

## 2019-04-29 DIAGNOSIS — M6250 Muscle wasting and atrophy, not elsewhere classified, unspecified site: Secondary | ICD-10-CM | POA: Diagnosis not present

## 2019-04-29 DIAGNOSIS — R531 Weakness: Secondary | ICD-10-CM | POA: Insufficient documentation

## 2019-04-29 DIAGNOSIS — Z7952 Long term (current) use of systemic steroids: Secondary | ICD-10-CM | POA: Diagnosis not present

## 2019-04-29 DIAGNOSIS — C801 Malignant (primary) neoplasm, unspecified: Secondary | ICD-10-CM

## 2019-04-29 LAB — CMP (CANCER CENTER ONLY)
ALT: 9 U/L (ref 0–44)
AST: 12 U/L — ABNORMAL LOW (ref 15–41)
Albumin: 4.1 g/dL (ref 3.5–5.0)
Alkaline Phosphatase: 69 U/L (ref 38–126)
Anion gap: 8 (ref 5–15)
BUN: 21 mg/dL (ref 8–23)
CO2: 26 mmol/L (ref 22–32)
Calcium: 9.7 mg/dL (ref 8.9–10.3)
Chloride: 103 mmol/L (ref 98–111)
Creatinine: 1.28 mg/dL — ABNORMAL HIGH (ref 0.61–1.24)
GFR, Est AFR Am: >60 mL/min (ref >60)
GFR, Estimated: 56 mL/min — ABNORMAL LOW (ref >60)
Glucose, Bld: 107 mg/dL — ABNORMAL HIGH (ref 70–99)
Potassium: 4.2 mmol/L (ref 3.5–5.1)
Sodium: 137 mmol/L (ref 135–145)
Total Bilirubin: 0.4 mg/dL (ref 0.3–1.2)
Total Protein: 6.2 g/dL — ABNORMAL LOW (ref 6.5–8.1)

## 2019-04-29 LAB — CBC WITH DIFFERENTIAL (CANCER CENTER ONLY)
Abs Immature Granulocytes: 0.04 10*3/uL (ref 0.00–0.07)
Basophils Absolute: 0.2 10*3/uL — ABNORMAL HIGH (ref 0.0–0.1)
Basophils Relative: 2 %
Eosinophils Absolute: 0.3 10*3/uL (ref 0.0–0.5)
Eosinophils Relative: 4 %
HCT: 32.1 % — ABNORMAL LOW (ref 39.0–52.0)
Hemoglobin: 10.4 g/dL — ABNORMAL LOW (ref 13.0–17.0)
Immature Granulocytes: 1 %
Lymphocytes Relative: 12 %
Lymphs Abs: 0.9 10*3/uL (ref 0.7–4.0)
MCH: 33.1 pg (ref 26.0–34.0)
MCHC: 32.4 g/dL (ref 30.0–36.0)
MCV: 102.2 fL — ABNORMAL HIGH (ref 80.0–100.0)
Monocytes Absolute: 0.9 10*3/uL (ref 0.1–1.0)
Monocytes Relative: 11 %
Neutro Abs: 5.5 10*3/uL (ref 1.7–7.7)
Neutrophils Relative %: 70 %
Platelet Count: 340 10*3/uL (ref 150–400)
RBC: 3.14 MIL/uL — ABNORMAL LOW (ref 4.22–5.81)
RDW: 15.7 % — ABNORMAL HIGH (ref 11.5–15.5)
WBC Count: 7.8 10*3/uL (ref 4.0–10.5)
nRBC: 0 % (ref 0.0–0.2)

## 2019-04-29 LAB — LACTATE DEHYDROGENASE: LDH: 205 U/L — ABNORMAL HIGH (ref 98–192)

## 2019-04-29 MED ORDER — PALONOSETRON HCL INJECTION 0.25 MG/5ML
0.2500 mg | Freq: Once | INTRAVENOUS | Status: AC
  Administered 2019-04-29: 0.25 mg via INTRAVENOUS

## 2019-04-29 MED ORDER — PALONOSETRON HCL INJECTION 0.25 MG/5ML
INTRAVENOUS | Status: AC
  Filled 2019-04-29: qty 5

## 2019-04-29 MED ORDER — SODIUM CHLORIDE 0.9 % IV SOLN
Freq: Once | INTRAVENOUS | Status: AC
  Filled 2019-04-29: qty 250

## 2019-04-29 MED ORDER — SODIUM CHLORIDE 0.9 % IV SOLN
3.2000 mg/m2 | Freq: Once | INTRAVENOUS | Status: AC
  Administered 2019-04-29: 5.8 mg via INTRAVENOUS
  Filled 2019-04-29: qty 11.6

## 2019-04-29 MED ORDER — SODIUM CHLORIDE 0.9% FLUSH
10.0000 mL | INTRAVENOUS | Status: DC | PRN
  Administered 2019-04-29: 10 mL
  Filled 2019-04-29: qty 10

## 2019-04-29 MED ORDER — SODIUM CHLORIDE 0.9 % IV SOLN
10.0000 mg | Freq: Once | INTRAVENOUS | Status: AC
  Administered 2019-04-29: 10 mg via INTRAVENOUS
  Filled 2019-04-29: qty 10

## 2019-04-29 MED ORDER — DENOSUMAB 120 MG/1.7ML ~~LOC~~ SOLN
120.0000 mg | Freq: Once | SUBCUTANEOUS | Status: AC
  Administered 2019-04-29: 120 mg via SUBCUTANEOUS

## 2019-04-29 MED ORDER — DENOSUMAB 120 MG/1.7ML ~~LOC~~ SOLN
SUBCUTANEOUS | Status: AC
  Filled 2019-04-29: qty 1.7

## 2019-04-29 MED ORDER — HEPARIN SOD (PORK) LOCK FLUSH 100 UNIT/ML IV SOLN
500.0000 [IU] | Freq: Once | INTRAVENOUS | Status: AC | PRN
  Administered 2019-04-29: 500 [IU]
  Filled 2019-04-29: qty 5

## 2019-04-29 MED FILL — PROCHLORPERAZINE 10 MG TAB: 10 | 7 days supply | Qty: 30 | Fill #0

## 2019-04-29 MED FILL — PANTOPRAZOLE SOD DR 40 MG T: 40 | 30 days supply | Qty: 30 | Fill #4

## 2019-04-29 MED FILL — GABAPENTIN 300 MG CAPSULE: 300 | 30 days supply | Qty: 120 | Fill #0

## 2019-04-29 NOTE — Progress Notes (Signed)
Hematology and Oncology Follow Up Visit  [name] [id number] 11-27-1945 74 y.o. 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 Iron def anemia -- malabsorption   Current Therapy:    Zytiga 1000 mg by mouth daily - discontinued on 08/15/2016  Xtandi 160 mg po q day - start on 08/15/2016 - discontinued  Xgeva 120 mg subcutaneous Q3 months - due in 07/2019  Lupron 22 mg IM every 3 months - due in 07/2019  Radium-223 therapy -- s/p cycle #6  Palliative radiation to the right sacrum  Lutathera - s/p cycle 4/4 --last dose on 07/11/2017  Carbo/VP-16/Tecentriq --  S/p cycle #5  Keytruda q 6 week -- Maintanence -- s/p cycle #3 - changed from 3 to 6 week on 06/30/2018  Taxotere/Keytruda -- start on 09/17/2018 -- s/p cycle #10 -- d/c due to progression  Lurbinectedin -- cycle #1 to start on 04/29/2019  Injectafer 750 mg IV prn     Interim History:  [name] is back for follow-up.  As always, he looks quite good.  He really has no specific complaints.  Sounds like he had a wonderful weekend.  He and his family were down at the [facility].  They really had a good time.  He and his wife just moved.  They are able to sell her house in 2 days and find a house in 1 day.  He is not having any urinary issues.  There is no hematuria.  Today, we will start the Lurbinectedin.  Hopefully, this will help with his neuroendocrine tumor.  He has had no cough.  He has had no chest wall pain.  He has had no wheezing.  There is no fever.  There is no bleeding.  He has had no nausea or vomiting.  He has had no leg swelling.  He still has the brace on his right lower leg.  Overall, I would say his performance status is ECOG 1.     Medications:  Current Outpatient Medications:  .  acetaminophen (TYLENOL) 500 MG tablet, Take 1,000 mg by mouth every 4 (four) hours as needed for moderate  pain., Disp: , Rfl:  .  aspirin 81 MG tablet, Take 81 mg by mouth daily., Disp: , Rfl:  .  Calcium Carbonate-Vitamin D (OYSTER SHELL CALCIUM/D) 250-125 MG-UNIT TABS, Take by mouth., Disp: , Rfl:  .  CALCIUM-IRON-VIT D-VIT K PO, Take 1 tablet by mouth 2 (two) times daily., Disp: , Rfl:  .  dexamethasone (DECADRON) 4 MG tablet, Take 8 mg by mouth 2 (two) times daily. Takes day of chemo, and 2 days afterwards., Disp: , Rfl:  .  docusate sodium (COLACE) 100 MG capsule, Take 200 mg by mouth 2 (two) times daily., Disp: , Rfl:  .  gabapentin (NEURONTIN) 300 MG capsule, TAKE 1 CAPSULE BY MOUTH TWICE A DAY THEN TAKE 2 CAPSULES AT BEDTIME (Patient taking differently: Take 300 mg by mouth 2 (two) times daily. ), Disp: 120 capsule, Rfl: 4 .  isosorbide mononitrate (IMDUR) 30 MG 24 hr tablet, Take 30 mg by mouth daily., Disp: , Rfl:  .  lidocaine-prilocaine (EMLA) cream, , Disp: , Rfl:  .  ondansetron (ZOFRAN) 8 MG tablet, Take 1 tablet (8 mg total) by mouth 2 (two) times daily as needed for nausea or vomiting., Disp: 20 tablet, Rfl: 0 .  OVER THE COUNTER MEDICATION, every morning. Juice Plus -- 8 capsule of each  the garden, vineyard, and orchard twice a day., Disp: , Rfl:  .  pantoprazole (PROTONIX) 40 MG tablet, Take 1 tablet (40 mg total) by mouth at bedtime., Disp: 30 tablet, Rfl: 6 .  prochlorperazine (COMPAZINE) 10 MG tablet, TAKE 1 TABLET (10 MG TOTAL) BY MOUTH EVERY 6 (SIX) HOURS AS NEEDED (NAUSEA OR VOMITING)., Disp: 30 tablet, Rfl: 1 .  solifenacin (VESICARE) 10 MG tablet, TAKE 1 TABLET BY MOUTH ONCE DAILY, Disp: 30 tablet, Rfl: 5 .  sucralfate (CARAFATE) 1 GM/10ML suspension, Take 10 mLs (1 g total) by mouth 4 (four) times daily -  with meals and at bedtime., Disp: 420 mL, Rfl: 5 .  ZETIA 10 MG tablet, Take 10 mg by mouth daily. , Disp: , Rfl:  .  isosorbide mononitrate (IMDUR) 30 MG 24 hr tablet, TAKE 1 TABLET (30 MG TOTAL) BY MOUTH DAILY., Disp: 90 tablet, Rfl: 1 .  nitroGLYCERIN (NITROSTAT) 0.4 MG  SL tablet, Place 1 tablet (0.4 mg total) under the tongue every 5 (five) minutes as needed for chest pain. (Patient not taking: Reported on 04/29/2019), Disp: 25 tablet, Rfl: 11 No current facility-administered medications for this visit.  Facility-Administered Medications Ordered in Other Visits:  .  octreotide (SANDOSTATIN LAR) IM injection 30 mg, 30 mg, Intramuscular, Once, Jalasia Eskridge, Rudell Cobb, MD  Allergies:  Allergies  Allergen Reactions  . Codeine Nausea And Vomiting  . Hydrocodone Nausea And Vomiting    Past Medical History, Surgical history, Social history, and Family History were reviewed and updated.  Review of Systems: Review of Systems  Constitutional: Negative.   HENT: Negative.   Eyes: Negative.   Respiratory: Negative.   Cardiovascular: Negative.   Gastrointestinal: Negative.   Genitourinary: Negative.  Negative for urgency.  Musculoskeletal: Positive for joint pain.  Skin: Negative.   Neurological: Positive for focal weakness.  Endo/Heme/Allergies: Negative.   Psychiatric/Behavioral: Negative.   All other systems reviewed and are negative.    Physical Exam:  height is 5\' 8"  (1.727 m) and weight is 151 lb (68.5 kg). His temporal temperature is 97.8 F (36.6 C). His blood pressure is 141/76 (abnormal) and his pulse is 92. His respiration is 18 and oxygen saturation is 100%. PET scan  Physical Exam Vitals reviewed.  HENT:     Head: Normocephalic and atraumatic.  Eyes:     Pupils: Pupils are equal, round, and reactive to light.  Cardiovascular:     Rate and Rhythm: Normal rate and regular rhythm.     Heart sounds: Normal heart sounds.  Pulmonary:     Effort: Pulmonary effort is normal.     Breath sounds: Normal breath sounds.  Abdominal:     General: Bowel sounds are normal.     Palpations: Abdomen is soft.  Musculoskeletal:        General: No tenderness or deformity. Normal range of motion.     Cervical back: Normal range of motion.     Comments: His  extremities shows the brace on the right lower leg.  He has some muscle atrophy in the right lower leg.  Left leg is unremarkable.  He has good range of motion of his joints.  He has good pulses in his distal extremities.  Lymphadenopathy:     Cervical: No cervical adenopathy.  Skin:    General: Skin is warm and dry.     Findings: No erythema or rash.  Neurological:     Mental Status: He is alert and oriented to person, place, and time.  Psychiatric:  Behavior: Behavior normal.        Thought Content: Thought content normal.        Judgment: Judgment normal.     Lab Results  Component Value Date   WBC 7.8 04/29/2019   HGB 10.4 (L) 04/29/2019   HCT 32.1 (L) 04/29/2019   MCV 102.2 (H) 04/29/2019   PLT 340 04/29/2019     Chemistry      Component Value Date/Time   NA 137 04/29/2019 0917   NA 144 12/14/2016 1159   NA 140 10/22/2016 1129   K 4.2 04/29/2019 0917   K 3.5 12/14/2016 1159   K 4.3 10/22/2016 1129   CL 103 04/29/2019 0917   CL 103 12/14/2016 1159   CO2 26 04/29/2019 0917   CO2 30 12/14/2016 1159   CO2 27 10/22/2016 1129   BUN 21 04/29/2019 0917   BUN 16 12/14/2016 1159   BUN 14.0 10/22/2016 1129   CREATININE 1.28 (H) 04/29/2019 0917   CREATININE 1.3 (H) 12/14/2016 1159   CREATININE 1.1 10/22/2016 1129      Component Value Date/Time   CALCIUM 9.7 04/29/2019 0917   CALCIUM 10.0 12/14/2016 1159   CALCIUM 10.4 10/22/2016 1129   ALKPHOS 69 04/29/2019 0917   ALKPHOS 48 12/14/2016 1159   ALKPHOS 60 10/22/2016 1129   AST 12 (L) 04/29/2019 0917   AST 16 10/22/2016 1129   ALT 9 04/29/2019 0917   ALT 23 12/14/2016 1159   ALT 14 10/22/2016 1129   BILITOT 0.4 04/29/2019 0917   BILITOT 0.31 10/22/2016 1129      Impression and Plan: Mr. 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   I will go ahead and try him on the new agent for small cell lung cancer/neuroendocrine carcinoma - lurbinectedin  (Zepzelca).  I think this would be reasonable to use with him.  He will get his Delton See and Lupron today.  I will plan to get him back to see [place] in another 3 weeks.      [name], MD 4/14/20219:51 AM

## 2019-04-29 NOTE — Patient Instructions (Signed)

## 2019-04-29 NOTE — Progress Notes (Signed)
Eligard to be given with  patient's next infusion appointment on 5/5 per Dr. Marin Olp.

## 2019-04-29 NOTE — Patient Instructions (Signed)
[facility] Discharge Instructions for Patients Receiving Chemotherapy  Today you received the following chemotherapy agents Lurbinectedin  To help prevent nausea and vomiting after your treatment, we encourage you to take your nausea medication as prescribed by MD.   If you develop nausea and vomiting that is not controlled by your nausea medication, call the clinic.   BELOW ARE SYMPTOMS THAT SHOULD BE REPORTED IMMEDIATELY:  *FEVER GREATER THAN 100.5 F  *CHILLS WITH OR WITHOUT FEVER  NAUSEA AND VOMITING THAT IS NOT CONTROLLED WITH YOUR NAUSEA MEDICATION  *UNUSUAL SHORTNESS OF BREATH  *UNUSUAL BRUISING OR BLEEDING  TENDERNESS IN MOUTH AND THROAT WITH OR WITHOUT PRESENCE OF ULCERS  *URINARY PROBLEMS  *BOWEL PROBLEMS  UNUSUAL RASH Items with * indicate a potential emergency and should be followed up as soon as possible.  Feel free to call the clinic should you have any questions or concerns. The clinic phone number is [phone].  Please show the [facility] at check-in to the Emergency Department and triage nurse.   Lurbinectedin Injection What is this medicine? LURBINECTEDIN (LOOR bin EK te din) is a chemotherapy drug. This medicine is used to treat lung cancer. This medicine may be used for other purposes; ask your health care provider or pharmacist if you have questions. COMMON BRAND NAME(S): ZEPZELCA What should I tell my health care provider before I take this medicine? They need to know if you have any of these conditions:  infection (especially a viral infection such as chickenpox, cold sores, or herpes)  liver disease  low blood counts, like white cells, platelets, or red blood cells  an unusual or allergic reaction to lurbinectedin, other medicines, foods, dyes or preservatives  pregnant or trying to get pregnant  breast-feeding How should I use this medicine? This medicine is for infusion into a vein. It is given by a healthcare  professional in a hospital or clinic setting. Talk to your pediatrician about the use of this medicine in children. Special care may be needed. Overdosage: If you think you have taken too much of this medicine contact a poison control center or emergency room at once. NOTE: This medicine is only for you. Do not share this medicine with others. What if I miss a dose? Keep appointments for follow-up doses. It is important not to miss your dose. Call your doctor or health care professional if you are unable to keep an appointment. What may interact with this medicine?  certain antibiotics like erythromycin or clarithromycin  certain antivirals for HIV or hepatitis  certain medicines for fungal infections like ketoconazole, itraconazole, or posaconazole  certain medicines for seizures like carbamazepine, phenobarbital, phenytoin  grapefruit or grapefruit juice  St. John's Wort This list may not describe all possible interactions. Give your health care provider a list of all the medicines, herbs, non-prescription drugs, or dietary supplements you use. Also tell them if you smoke, drink alcohol, or use illegal drugs. Some items may interact with your medicine. What should I watch for while using this medicine? Your condition will be monitored carefully while you are receiving this medicine. Do not become pregnant while taking this medicine or for 6 months after stopping it. Women should inform their health care professional if they wish to become pregnant or think they might be pregnant. Men should not father a child while taking this medicine and for 4 months after stopping it. There is potential for serious side effects to an unborn child. Talk to your health care  professional for more information. Do not breast-feed a child while taking this medicine or for 2 weeks after stopping it. Call your health care professional for advice if you get a fever, chills, or sore throat, or other symptoms of a  cold or flu. Do not treat yourself. This medicine decreases your body's ability to fight infections. Try to avoid being around people who are sick. Avoid taking medicines that contain aspirin, acetaminophen, ibuprofen, naproxen, or ketoprofen unless instructed by your health care professional. These medicines may hide a fever. Be careful brushing or flossing your teeth or using a toothpick because you may get an infection or bleed more easily. If you have any dental work done, tell your dentist you are receiving this medicine. What side effects may I notice from receiving this medicine? Side effects that you should report to your doctor or health care professional as soon as possible:  allergic reactions like skin rash, itching or hives; swelling of the face, lips, or tongue  chest pain  nausea, vomiting  signs and symptoms of bleeding such as bloody or black, tarry stools; red or dark-brown urine; spitting up blood or brown material that looks like coffee grounds; red spots on the skin; unusual bruising or bleeding from the eyes, gums, or nose  signs and symptoms of infection like fever; chills; cough; sore throat; pain or trouble passing urine  signs and symptoms of liver injury like dark yellow or brown urine; general ill feeling or flu-like symptoms; light-colored stools; loss of appetite; nausea; right upper belly pain; unusually weak or tired; yellowing of the eyes or skin Side effects that usually do not require medical attention (report these to your doctor or health care professional if they continue or are bothersome):  changes in taste  constipation  diarrhea  loss of appetite  muscle pain  pain, tingling, numbness in the hands or feet  signs and symptoms of high blood sugar such as being more thirsty or hungry or having to urinate more than normal. You may also feel very tired or have blurry vision.  signs and symptoms of low magnesium like muscle cramps; muscle pain;  muscle weakness; tremors; seizures; or fast, irregular heartbeat  signs and symptoms of low red blood cells or anemia such as unusually weak or tired; feeling faint or lightheaded; falls; breathing problems  stomach pain This list may not describe all possible side effects. Call your doctor for medical advice about side effects. You may report side effects to FDA at 1-800-FDA-1088. Where should I keep my medicine? This medicine is given in a hospital or clinic and will not be stored at home. NOTE: This sheet is a summary. It may not cover all possible information. If you have questions about this medicine, talk to your doctor, pharmacist, or health care provider.  2020 [employer]vier/Gold Standard (2018-07-03 13:00:33)

## 2019-04-29 NOTE — Telephone Encounter (Signed)
Appointments were already scheduled as requested per 4/14 los

## 2019-04-30 LAB — CHROMOGRANIN A: Chromogranin A (ng/mL): 818.6 ng/mL — ABNORMAL HIGH (ref 0.0–101.8)

## 2019-05-07 ENCOUNTER — Telehealth: Payer: Self-pay | Admitting: *Deleted

## 2019-05-07 ENCOUNTER — Other Ambulatory Visit: Payer: Self-pay | Admitting: *Deleted

## 2019-05-07 DIAGNOSIS — K5903 Drug induced constipation: Secondary | ICD-10-CM

## 2019-05-07 DIAGNOSIS — R64 Cachexia: Secondary | ICD-10-CM

## 2019-05-07 DIAGNOSIS — C7951 Secondary malignant neoplasm of bone: Secondary | ICD-10-CM

## 2019-05-07 MED ORDER — LACTULOSE 10 GM/15ML PO SOLN: 30.0000 g | Freq: Every day | ORAL | 2 refills | Status: DC | PRN

## 2019-05-07 MED FILL — GENERLAC 10 GM/15 ML SOLN: 10 | 42 days supply | Qty: 1892 | Fill #0

## 2019-05-07 NOTE — Telephone Encounter (Signed)
Message received from patient requesting refill for Lactulose for constipation.  Dr. Marin Olp notified and refill sent per pt.'s request.

## 2019-05-13 NOTE — Progress Notes (Signed)
Pharmacist Chemotherapy Monitoring - Follow Up Assessment    I verify that I have reviewed each item in the below checklist:  . Regimen for the patient is scheduled for the appropriate day and plan matches scheduled date. Marland Kitchen Appropriate non-routine labs are ordered dependent on drug ordered. . If applicable, additional medications reviewed and ordered per protocol based on lifetime cumulative doses and/or treatment regimen.   Plan for follow-up and/or issues identified: No . I-vent associated with next due treatment: Yes . MD and/or nursing notified: No   Kennith Center, Pharm.D., CPP 05/13/2019@11 :56 AM

## 2019-05-20 ENCOUNTER — Inpatient Hospital Stay: Payer: Medicare Other

## 2019-05-20 ENCOUNTER — Inpatient Hospital Stay: Payer: Medicare Other | Attending: Hematology & Oncology | Admitting: Hematology & Oncology

## 2019-05-20 ENCOUNTER — Other Ambulatory Visit: Payer: Self-pay | Admitting: *Deleted

## 2019-05-20 ENCOUNTER — Other Ambulatory Visit: Payer: Self-pay

## 2019-05-20 VITALS — BP 110/63 | HR 92 | Temp 97.1°F | Resp 18

## 2019-05-20 VITALS — Wt 150.0 lb

## 2019-05-20 DIAGNOSIS — C7B8 Other secondary neuroendocrine tumors: Secondary | ICD-10-CM | POA: Diagnosis not present

## 2019-05-20 DIAGNOSIS — Z7952 Long term (current) use of systemic steroids: Secondary | ICD-10-CM | POA: Diagnosis not present

## 2019-05-20 DIAGNOSIS — Z79899 Other long term (current) drug therapy: Secondary | ICD-10-CM | POA: Diagnosis not present

## 2019-05-20 DIAGNOSIS — Z5111 Encounter for antineoplastic chemotherapy: Secondary | ICD-10-CM | POA: Insufficient documentation

## 2019-05-20 DIAGNOSIS — C7A8 Other malignant neuroendocrine tumors: Secondary | ICD-10-CM

## 2019-05-20 DIAGNOSIS — D500 Iron deficiency anemia secondary to blood loss (chronic): Secondary | ICD-10-CM

## 2019-05-20 DIAGNOSIS — C61 Malignant neoplasm of prostate: Secondary | ICD-10-CM | POA: Diagnosis not present

## 2019-05-20 DIAGNOSIS — C7951 Secondary malignant neoplasm of bone: Secondary | ICD-10-CM

## 2019-05-20 DIAGNOSIS — D508 Other iron deficiency anemias: Secondary | ICD-10-CM | POA: Insufficient documentation

## 2019-05-20 DIAGNOSIS — M2550 Pain in unspecified joint: Secondary | ICD-10-CM | POA: Diagnosis not present

## 2019-05-20 DIAGNOSIS — R32 Unspecified urinary incontinence: Secondary | ICD-10-CM | POA: Diagnosis not present

## 2019-05-20 DIAGNOSIS — R531 Weakness: Secondary | ICD-10-CM | POA: Diagnosis not present

## 2019-05-20 DIAGNOSIS — K909 Intestinal malabsorption, unspecified: Secondary | ICD-10-CM | POA: Diagnosis not present

## 2019-05-20 DIAGNOSIS — M62561 Muscle wasting and atrophy, not elsewhere classified, right lower leg: Secondary | ICD-10-CM | POA: Insufficient documentation

## 2019-05-20 DIAGNOSIS — Z885 Allergy status to narcotic agent status: Secondary | ICD-10-CM | POA: Diagnosis not present

## 2019-05-20 LAB — CBC WITH DIFFERENTIAL (CANCER CENTER ONLY)
Abs Immature Granulocytes: 0.03 10*3/uL (ref 0.00–0.07)
Basophils Absolute: 0 10*3/uL (ref 0.0–0.1)
Basophils Relative: 0 %
Eosinophils Absolute: 0.5 10*3/uL (ref 0.0–0.5)
Eosinophils Relative: 7 %
HCT: 32.7 % — ABNORMAL LOW (ref 39.0–52.0)
Hemoglobin: 10.6 g/dL — ABNORMAL LOW (ref 13.0–17.0)
Immature Granulocytes: 0 %
Lymphocytes Relative: 12 %
Lymphs Abs: 0.9 10*3/uL (ref 0.7–4.0)
MCH: 33.1 pg (ref 26.0–34.0)
MCHC: 32.4 g/dL (ref 30.0–36.0)
MCV: 102.2 fL — ABNORMAL HIGH (ref 80.0–100.0)
Monocytes Absolute: 0.4 10*3/uL (ref 0.1–1.0)
Monocytes Relative: 6 %
Neutro Abs: 5.6 10*3/uL (ref 1.7–7.7)
Neutrophils Relative %: 75 %
Platelet Count: 324 10*3/uL (ref 150–400)
RBC: 3.2 MIL/uL — ABNORMAL LOW (ref 4.22–5.81)
RDW: 14 % (ref 11.5–15.5)
WBC Count: 7.6 10*3/uL (ref 4.0–10.5)
nRBC: 0 % (ref 0.0–0.2)

## 2019-05-20 LAB — CMP (CANCER CENTER ONLY)
ALT: 10 U/L (ref 0–44)
AST: 12 U/L — ABNORMAL LOW (ref 15–41)
Albumin: 4 g/dL (ref 3.5–5.0)
Alkaline Phosphatase: 60 U/L (ref 38–126)
Anion gap: 7 (ref 5–15)
BUN: 25 mg/dL — ABNORMAL HIGH (ref 8–23)
CO2: 27 mmol/L (ref 22–32)
Calcium: 9.3 mg/dL (ref 8.9–10.3)
Chloride: 101 mmol/L (ref 98–111)
Creatinine: 1.36 mg/dL — ABNORMAL HIGH (ref 0.61–1.24)
GFR, Est AFR Am: >60 mL/min (ref >60)
GFR, Estimated: 52 mL/min — ABNORMAL LOW (ref >60)
Glucose, Bld: 135 mg/dL — ABNORMAL HIGH (ref 70–99)
Potassium: 4.9 mmol/L (ref 3.5–5.1)
Sodium: 135 mmol/L (ref 135–145)
Total Bilirubin: 0.3 mg/dL (ref 0.3–1.2)
Total Protein: 6.2 g/dL — ABNORMAL LOW (ref 6.5–8.1)

## 2019-05-20 MED ORDER — PALONOSETRON HCL INJECTION 0.25 MG/5ML
0.2500 mg | Freq: Once | INTRAVENOUS | Status: AC
  Administered 2019-05-20: 0.25 mg via INTRAVENOUS

## 2019-05-20 MED ORDER — SODIUM CHLORIDE 0.9 % IV SOLN
3.2000 mg/m2 | Freq: Once | INTRAVENOUS | Status: AC
  Administered 2019-05-20: 5.8 mg via INTRAVENOUS
  Filled 2019-05-20: qty 11.6

## 2019-05-20 MED ORDER — PALONOSETRON HCL INJECTION 0.25 MG/5ML
INTRAVENOUS | Status: AC
  Filled 2019-05-20: qty 5

## 2019-05-20 MED ORDER — HEPARIN SOD (PORK) LOCK FLUSH 100 UNIT/ML IV SOLN
500.0000 [IU] | Freq: Once | INTRAVENOUS | Status: AC | PRN
  Administered 2019-05-20: 500 [IU]
  Filled 2019-05-20: qty 5

## 2019-05-20 MED ORDER — AMOXICILLIN 500 MG PO CAPS
2000.0000 mg | ORAL_CAPSULE | Freq: Once | ORAL | 0 refills | Status: AC
Start: 2019-05-20 — End: 2019-05-20

## 2019-05-20 MED ORDER — LEUPROLIDE ACETATE (3 MONTH) 22.5 MG ~~LOC~~ KIT
22.5000 mg | PACK | Freq: Once | SUBCUTANEOUS | Status: AC
  Administered 2019-05-20: 22.5 mg via SUBCUTANEOUS
  Filled 2019-05-20: qty 22.5

## 2019-05-20 MED ORDER — SODIUM CHLORIDE 0.9% FLUSH
10.0000 mL | INTRAVENOUS | Status: DC | PRN
  Administered 2019-05-20: 10 mL
  Filled 2019-05-20: qty 10

## 2019-05-20 MED ORDER — SODIUM CHLORIDE 0.9 % IV SOLN
10.0000 mg | Freq: Once | INTRAVENOUS | Status: AC
  Administered 2019-05-20: 11:00:00 10 mg via INTRAVENOUS
  Filled 2019-05-20: qty 1

## 2019-05-20 MED ORDER — SODIUM CHLORIDE 0.9 % IV SOLN
Freq: Once | INTRAVENOUS | Status: AC
  Filled 2019-05-20: qty 250

## 2019-05-20 MED FILL — EZETIMIBE 10 MG TABS: 10 | 90 days supply | Qty: 90 | Fill #2

## 2019-05-20 MED FILL — PANTOPRAZOLE SOD DR 40 MG T: 40 | 30 days supply | Qty: 30 | Fill #5

## 2019-05-20 MED FILL — PROCHLORPERAZINE 10 MG TAB: 10 | 7 days supply | Qty: 30 | Fill #1

## 2019-05-20 NOTE — Progress Notes (Signed)
Hematology and Oncology Follow Up Visit  [name] [id number] [birth year] 74 y.o. 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 Iron def anemia -- malabsorption   Current Therapy:    Zytiga 1000 mg by mouth daily - discontinued on 08/15/2016  Xtandi 160 mg po q day - start on 08/15/2016 - discontinued  Xgeva 120 mg subcutaneous Q3 months - due in 07/2019  Lupron 22 mg IM every 3 months - due in 07/2019  Radium-223 therapy -- s/p cycle #6  Palliative radiation to the right sacrum  Lutathera - s/p cycle 4/4 --last dose on 07/11/2017  Carbo/VP-16/Tecentriq --  S/p cycle #5  Keytruda q 6 week -- Maintanence -- s/p cycle #3 - changed from 3 to 6 week on 06/30/2018  Taxotere/Keytruda -- start on 09/17/2018 -- s/p cycle #10 -- d/c due to progression  Lurbinectedin --  S/p cycle #1--  started on 04/29/2019  Injectafer 750 mg IV prn     Interim History:  Mr.  [name] is back for follow-up.  He is doing pretty well.  Seems like his main problem might be some urinary incontinence.  He still has this as an issue.  I told him that there is some kind of valve that can be inserted to try to help with urinary incontinence.  He tolerated the first dose of Lurbinectedin nicely.  He had no problems with diarrhea.  He has a little bit of constipation.  I told him to try the lactulose twice a day.  He has had no mouth sores.  He has had no cough.  Is been no shortness of breath.  He has had no nausea or vomiting.  Overall, I would say his performance status is ECOG 1.     Medications:  Current Outpatient Medications:  .  aspirin 81 MG tablet, Take 81 mg by mouth daily., Disp: , Rfl:  .  Calcium Carbonate-Vitamin D (OYSTER SHELL CALCIUM/D) 250-125 MG-UNIT TABS, Take by mouth daily. , Disp: , Rfl:  .  CALCIUM-IRON-VIT D-VIT K PO, Take 1 tablet by mouth 2 (two) times daily., Disp: , Rfl:   .  dexamethasone (DECADRON) 4 MG tablet, Take 8 mg by mouth 2 (two) times daily. Takes day of chemo, and 2 days afterwards., Disp: , Rfl:  .  docusate sodium (COLACE) 100 MG capsule, Take 200 mg by mouth 2 (two) times daily., Disp: , Rfl:  .  gabapentin (NEURONTIN) 300 MG capsule, TAKE 1 CAPSULE BY MOUTH TWICE A DAY THEN TAKE 2 CAPSULES AT BEDTIME (Patient taking differently: 2 (two) times daily. ), Disp: 120 capsule, Rfl: 2 .  GENERLAC 10 GM/15ML SOLN, SMARTSIG:45 Milliliter(s) By Mouth Daily PRN, Disp: , Rfl:  .  isosorbide mononitrate (IMDUR) 30 MG 24 hr tablet, Take 30 mg by mouth daily., Disp: , Rfl:  .  lidocaine-prilocaine (EMLA) cream, , Disp: , Rfl:  .  OVER THE COUNTER MEDICATION, every morning. Juice Plus -- 8 capsule of each the garden, vineyard, and orchard twice a day., Disp: , Rfl:  .  pantoprazole (PROTONIX) 40 MG tablet, Take 1 tablet (40 mg total) by mouth at bedtime., Disp: 30 tablet, Rfl: 6 .  prochlorperazine (COMPAZINE) 10 MG tablet, TAKE 1 TABLET (10 MG TOTAL) BY MOUTH EVERY 6 (SIX) HOURS AS NEEDED (NAUSEA OR VOMITING)., Disp: 30 tablet, Rfl: 1 .  solifenacin (VESICARE) 10 MG tablet, TAKE 1 TABLET BY MOUTH ONCE DAILY, Disp: 30 tablet,  Rfl: 5 .  ZETIA 10 MG tablet, Take 10 mg by mouth daily. , Disp: , Rfl:  .  isosorbide mononitrate (IMDUR) 30 MG 24 hr tablet, TAKE 1 TABLET (30 MG TOTAL) BY MOUTH DAILY., Disp: 90 tablet, Rfl: 1 .  nitroGLYCERIN (NITROSTAT) 0.4 MG SL tablet, Place 1 tablet (0.4 mg total) under the tongue every 5 (five) minutes as needed for chest pain. (Patient not taking: Reported on 04/29/2019), Disp: 25 tablet, Rfl: 11 .  ondansetron (ZOFRAN) 8 MG tablet, Take 1 tablet (8 mg total) by mouth 2 (two) times daily as needed for nausea or vomiting. (Patient not taking: Reported on 05/20/2019), Disp: 20 tablet, Rfl: 0 .  sucralfate (CARAFATE) 1 GM/10ML suspension, Take 10 mLs (1 g total) by mouth 4 (four) times daily -  with meals and at bedtime. (Patient not taking:  Reported on 05/20/2019), Disp: 420 mL, Rfl: 5 No current facility-administered medications for this visit.  Facility-Administered Medications Ordered in Other Visits:  .  octreotide (SANDOSTATIN LAR) IM injection 30 mg, 30 mg, Intramuscular, Once, Santiana Glidden, Rudell Cobb, MD  Allergies:  Allergies  Allergen Reactions  . Codeine Nausea And Vomiting  . Hydrocodone Nausea And Vomiting    Past Medical History, Surgical history, Social history, and Family History were reviewed and updated.  Review of Systems: Review of Systems  Constitutional: Negative.   HENT: Negative.   Eyes: Negative.   Respiratory: Negative.   Cardiovascular: Negative.   Gastrointestinal: Negative.   Genitourinary: Negative.  Negative for urgency.  Musculoskeletal: Positive for joint pain.  Skin: Negative.   Neurological: Positive for focal weakness.  Endo/Heme/Allergies: Negative.   Psychiatric/Behavioral: Negative.   All other systems reviewed and are negative.    Physical Exam:  weight is 150 lb (68 kg). PET scan  Physical Exam Vitals reviewed.  HENT:     Head: Normocephalic and atraumatic.  Eyes:     Pupils: Pupils are equal, round, and reactive to light.  Cardiovascular:     Rate and Rhythm: Normal rate and regular rhythm.     Heart sounds: Normal heart sounds.  Pulmonary:     Effort: Pulmonary effort is normal.     Breath sounds: Normal breath sounds.  Abdominal:     General: Bowel sounds are normal.     Palpations: Abdomen is soft.  Musculoskeletal:        General: No tenderness or deformity. Normal range of motion.     Cervical back: Normal range of motion.     Comments: His extremities shows the brace on the right lower leg.  He has some muscle atrophy in the right lower leg.  Left leg is unremarkable.  He has good range of motion of his joints.  He has good pulses in his distal extremities.  Lymphadenopathy:     Cervical: No cervical adenopathy.  Skin:    General: Skin is warm and dry.      Findings: No erythema or rash.  Neurological:     Mental Status: He is alert and oriented to person, place, and time.  Psychiatric:        Behavior: Behavior normal.        Thought Content: Thought content normal.        Judgment: Judgment normal.     Lab Results  Component Value Date   WBC 7.6 05/20/2019   HGB 10.6 (L) 05/20/2019   HCT 32.7 (L) 05/20/2019   MCV 102.2 (H) 05/20/2019   PLT 324 05/20/2019  Chemistry      Component Value Date/Time   NA 135 05/20/2019 1012   NA 144 12/14/2016 1159   NA 140 10/22/2016 1129   K 4.9 05/20/2019 1012   K 3.5 12/14/2016 1159   K 4.3 10/22/2016 1129   CL 101 05/20/2019 1012   CL 103 12/14/2016 1159   CO2 27 05/20/2019 1012   CO2 30 12/14/2016 1159   CO2 27 10/22/2016 1129   BUN 25 (H) 05/20/2019 1012   BUN 16 12/14/2016 1159   BUN 14.0 10/22/2016 1129   CREATININE 1.36 (H) 05/20/2019 1012   CREATININE 1.3 (H) 12/14/2016 1159   CREATININE 1.1 10/22/2016 1129      Component Value Date/Time   CALCIUM 9.3 05/20/2019 1012   CALCIUM 10.0 12/14/2016 1159   CALCIUM 10.4 10/22/2016 1129   ALKPHOS 60 05/20/2019 1012   ALKPHOS 48 12/14/2016 1159   ALKPHOS 60 10/22/2016 1129   AST 12 (L) 05/20/2019 1012   AST 16 10/22/2016 1129   ALT 10 05/20/2019 1012   ALT 23 12/14/2016 1159   ALT 14 10/22/2016 1129   BILITOT 0.3 05/20/2019 1012   BILITOT 0.31 10/22/2016 1129      Impression and Plan: Mr. 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  We will now plan for second cycle of the chemotherapy today.  Hopefully, he will tolerate this well.  I probably will try 3 cycles of Zepzelca and then we can rescan him.  We will plan to get him back here in another 3 weeks.      [name], MD 5/5/202111:10 AM

## 2019-05-20 NOTE — Patient Instructions (Signed)

## 2019-05-20 NOTE — Patient Instructions (Signed)
Leuprolide injection What is this medicine? LEUPROLIDE (loo PROE lide) is a man-made hormone. It is used to treat the symptoms of prostate cancer. This medicine may also be used to treat children with early onset of puberty. It may be used for other hormonal conditions. This medicine may be used for other purposes; ask your health care provider or pharmacist if you have questions. COMMON BRAND NAME(S): Lupron What should I tell my health care provider before I take this medicine? They need to know if you have any of these conditions:  diabetes  heart disease or previous heart attack  high blood pressure  high cholesterol  pain or difficulty passing urine  spinal cord metastasis  stroke  tobacco smoker  an unusual or allergic reaction to leuprolide, benzyl alcohol, other medicines, foods, dyes, or preservatives  pregnant or trying to get pregnant  breast-feeding How should I use this medicine? This medicine is for injection under the skin or into a muscle. You will be taught how to prepare and give this medicine. Use exactly as directed. Take your medicine at regular intervals. Do not take your medicine more often than directed. It is important that you put your used needles and syringes in a special sharps container. Do not put them in a trash can. If you do not have a sharps container, call your pharmacist or healthcare provider to get one. A special MedGuide will be given to you by the pharmacist with each prescription and refill. Be sure to read this information carefully each time. Talk to your pediatrician regarding the use of this medicine in children. While this medicine may be prescribed for children as young as 8 years for selected conditions, precautions do apply. Overdosage: If you think you have taken too much of this medicine contact a poison control center or emergency room at once. NOTE: This medicine is only for you. Do not share this medicine with others. What if  I miss a dose? If you miss a dose, take it as soon as you can. If it is almost time for your next dose, take only that dose. Do not take double or extra doses. What may interact with this medicine? Do not take this medicine with any of the following medications:  chasteberry This medicine may also interact with the following medications:  herbal or dietary supplements, like black cohosh or DHEA  male hormones, like estrogens or progestins and birth control pills, patches, rings, or injections  male hormones, like testosterone This list may not describe all possible interactions. Give your health care provider a list of all the medicines, herbs, non-prescription drugs, or dietary supplements you use. Also tell them if you smoke, drink alcohol, or use illegal drugs. Some items may interact with your medicine. What should I watch for while using this medicine? Visit your doctor or health care professional for regular checks on your progress. During the first week, your symptoms may get worse, but then will improve as you continue your treatment. You may get hot flashes, increased bone pain, increased difficulty passing urine, or an aggravation of nerve symptoms. Discuss these effects with your doctor or health care professional, some of them may improve with continued use of this medicine. Male patients may experience a menstrual cycle or spotting during the first 2 months of therapy with this medicine. If this continues, contact your doctor or health care professional. This medicine may increase blood sugar. Ask your healthcare provider if changes in diet or medicines are needed if  you have diabetes. What side effects may I notice from receiving this medicine? Side effects that you should report to your doctor or health care professional as soon as possible:  allergic reactions like skin rash, itching or hives, swelling of the face, lips, or tongue  breathing problems  chest  pain  depression or memory disorders  pain in your legs or groin  pain at site where injected  severe headache  signs and symptoms of high blood sugar such as being more thirsty or hungry or having to urinate more than normal. You may also feel very tired or have blurry vision  swelling of the feet and legs  visual changes  vomiting Side effects that usually do not require medical attention (report to your doctor or health care professional if they continue or are bothersome):  breast swelling or tenderness  decrease in sex drive or performance  diarrhea  hot flashes  loss of appetite  muscle, joint, or bone pains  nausea  redness or irritation at site where injected  skin problems or acne This list may not describe all possible side effects. Call your doctor for medical advice about side effects. You may report side effects to FDA at 1-800-FDA-1088. Where should I keep my medicine? Keep out of the reach of children. Store below 25 degrees C (77 degrees F). Do not freeze. Protect from light. Do not use if it is not clear or if there are particles present. Throw away any unused medicine after the expiration date. NOTE: This sheet is a summary. It may not cover all possible information. If you have questions about this medicine, talk to your doctor, pharmacist, or health care provider.  2020 Elsevier/Gold Standard (2017-10-31 09:52:48) Lurbinectedin Injection What is this medicine? LURBINECTEDIN (LOOR bin EK te din) is a chemotherapy drug. This medicine is used to treat lung cancer. This medicine may be used for other purposes; ask your health care provider or pharmacist if you have questions. COMMON BRAND NAME(S): ZEPZELCA What should I tell my health care provider before I take this medicine? They need to know if you have any of these conditions:  infection (especially a viral infection such as chickenpox, cold sores, or herpes)  liver disease  low blood counts,  like white cells, platelets, or red blood cells  an unusual or allergic reaction to lurbinectedin, other medicines, foods, dyes or preservatives  pregnant or trying to get pregnant  breast-feeding How should I use this medicine? This medicine is for infusion into a vein. It is given by a healthcare professional in a hospital or clinic setting. Talk to your pediatrician about the use of this medicine in children. Special care may be needed. Overdosage: If you think you have taken too much of this medicine contact a poison control center or emergency room at once. NOTE: This medicine is only for you. Do not share this medicine with others. What if I miss a dose? Keep appointments for follow-up doses. It is important not to miss your dose. Call your doctor or health care professional if you are unable to keep an appointment. What may interact with this medicine?  certain antibiotics like erythromycin or clarithromycin  certain antivirals for HIV or hepatitis  certain medicines for fungal infections like ketoconazole, itraconazole, or posaconazole  certain medicines for seizures like carbamazepine, phenobarbital, phenytoin  grapefruit or grapefruit juice  St. John's Wort This list may not describe all possible interactions. Give your health care provider a list of all the medicines,  herbs, non-prescription drugs, or dietary supplements you use. Also tell them if you smoke, drink alcohol, or use illegal drugs. Some items may interact with your medicine. What should I watch for while using this medicine? Your condition will be monitored carefully while you are receiving this medicine. Do not become pregnant while taking this medicine or for 6 months after stopping it. Women should inform their health care professional if they wish to become pregnant or think they might be pregnant. Men should not father a child while taking this medicine and for 4 months after stopping it. There is potential  for serious side effects to an unborn child. Talk to your health care professional for more information. Do not breast-feed a child while taking this medicine or for 2 weeks after stopping it. Call your health care professional for advice if you get a fever, chills, or sore throat, or other symptoms of a cold or flu. Do not treat yourself. This medicine decreases your body's ability to fight infections. Try to avoid being around people who are sick. Avoid taking medicines that contain aspirin, acetaminophen, ibuprofen, naproxen, or ketoprofen unless instructed by your health care professional. These medicines may hide a fever. Be careful brushing or flossing your teeth or using a toothpick because you may get an infection or bleed more easily. If you have any dental work done, tell your dentist you are receiving this medicine. What side effects may I notice from receiving this medicine? Side effects that you should report to your doctor or health care professional as soon as possible:  allergic reactions like skin rash, itching or hives; swelling of the face, lips, or tongue  chest pain  nausea, vomiting  signs and symptoms of bleeding such as bloody or black, tarry stools; red or dark-brown urine; spitting up blood or brown material that looks like coffee grounds; red spots on the skin; unusual bruising or bleeding from the eyes, gums, or nose  signs and symptoms of infection like fever; chills; cough; sore throat; pain or trouble passing urine  signs and symptoms of liver injury like dark yellow or brown urine; general ill feeling or flu-like symptoms; light-colored stools; loss of appetite; nausea; right upper belly pain; unusually weak or tired; yellowing of the eyes or skin Side effects that usually do not require medical attention (report these to your doctor or health care professional if they continue or are bothersome):  changes in taste  constipation  diarrhea  loss of  appetite  muscle pain  pain, tingling, numbness in the hands or feet  signs and symptoms of high blood sugar such as being more thirsty or hungry or having to urinate more than normal. You may also feel very tired or have blurry vision.  signs and symptoms of low magnesium like muscle cramps; muscle pain; muscle weakness; tremors; seizures; or fast, irregular heartbeat  signs and symptoms of low red blood cells or anemia such as unusually weak or tired; feeling faint or lightheaded; falls; breathing problems  stomach pain This list may not describe all possible side effects. Call your doctor for medical advice about side effects. You may report side effects to FDA at 1-800-FDA-1088. Where should I keep my medicine? This medicine is given in a hospital or clinic and will not be stored at home. NOTE: This sheet is a summary. It may not cover all possible information. If you have questions about this medicine, talk to your doctor, pharmacist, or health care provider.  2020 Elsevier/Gold Standard (  2018-07-03 13:00:33) Dexamethasone injection What is this medicine? DEXAMETHASONE (dex a METH a sone) is a corticosteroid. It is used to treat inflammation of the skin, joints, lungs, and other organs. Common conditions treated include asthma, allergies, and arthritis. It is also used for other conditions, like blood disorders and diseases of the adrenal glands. This medicine may be used for other purposes; ask your health care provider or pharmacist if you have questions. COMMON BRAND NAME(S): Decadron, DoubleDex, Simplist Dexamethasone, Solurex What should I tell my health care provider before I take this medicine? They need to know if you have any of these conditions:  Cushing's syndrome  diabetes  glaucoma  heart disease  high blood pressure  infection like herpes, measles, tuberculosis, or chickenpox  kidney disease  liver disease  mental illness  myasthenia  gravis  osteoporosis  previous heart attack  seizures  stomach or intestine problems  thyroid disease  an unusual or allergic reaction to dexamethasone, corticosteroids, other medicines, lactose, foods, dyes, or preservatives  pregnant or trying to get pregnant  breast-feeding How should I use this medicine? This medicine is for injection into a muscle, joint, lesion, soft tissue, or vein. It is given by a health care professional in a hospital or clinic setting. Talk to your pediatrician regarding the use of this medicine in children. Special care may be needed. Overdosage: If you think you have taken too much of this medicine contact a poison control center or emergency room at once. NOTE: This medicine is only for you. Do not share this medicine with others. What if I miss a dose? This may not apply. If you are having a series of injections over a prolonged period, try not to miss an appointment. Call your doctor or health care professional to reschedule if you are unable to keep an appointment. What may interact with this medicine? Do not take this medicine with any of the following medications:  live virus vaccines This medicine may also interact with the following medications:  aminoglutethimide  amphotericin B  aspirin and aspirin-like medicines  certain antibiotics like erythromycin, clarithromycin, and troleandomycin  certain antivirals for HIV or hepatitis  certain medicines for seizures like carbamazepine, phenobarbital, phenytoin  certain medicines to treat myasthenia gravis  cholestyramine  cyclosporine  digoxin  diuretics  ephedrine  male hormones, like estrogen or progestins and birth control pills  insulin or other medicines for diabetes  isoniazid  ketoconazole  medicines that relax muscles for surgery  mifepristone  NSAIDs, medicines for pain and inflammation, like ibuprofen or naproxen  rifampin  skin tests for  allergies  thalidomide  vaccines  warfarin This list may not describe all possible interactions. Give your health care provider a list of all the medicines, herbs, non-prescription drugs, or dietary supplements you use. Also tell them if you smoke, drink alcohol, or use illegal drugs. Some items may interact with your medicine. What should I watch for while using this medicine? Visit your health care professional for regular checks on your progress. Tell your health care professional if your symptoms do not start to get better or if they get worse. Your condition will be monitored carefully while you are receiving this medicine. Wear a medical ID bracelet or chain. Carry a card that describes your disease and details of your medicine and dosage times. This medicine may increase your risk of getting an infection. Call your health care professional for advice if you get a fever, chills, or sore throat, or other symptoms of  a cold or flu. Do not treat yourself. Try to avoid being around people who are sick. Call your health care professional if you are around anyone with measles, chickenpox, or if you develop sores or blisters that do not heal properly. If you are going to need surgery or other procedures, tell your doctor or health care professional that you have taken this medicine within the last 12 months. Ask your doctor or health care professional about your diet. You may need to lower the amount of salt you eat. This medicine may increase blood sugar. Ask your healthcare provider if changes in diet or medicines are needed if you have diabetes. What side effects may I notice from receiving this medicine? Side effects that you should report to your doctor or health care professional as soon as possible:  allergic reactions like skin rash, itching or hives, swelling of the face, lips, or tongue  bloody or black, tarry stools  changes in emotions or moods  changes in vision  confusion,  excitement, restlessness  depressed mood  eye pain  hallucinations  muscle weakness  severe or sudden stomach or belly pain  signs and symptoms of high blood sugar such as being more thirsty or hungry or having to urinate more than normal. You may also feel very tired or have blurry vision.  signs and symptoms of infection like fever; chills; cough; sore throat; pain or trouble passing urine  swelling of ankles, feet  unusual bruising or bleeding  wounds that do not heal Side effects that usually do not require medical attention (report to your doctor or health care professional if they continue or are bothersome):  increased appetite  increased growth of face or body hair  headache  nausea, vomiting  pain, redness, or irritation at site where injected  skin problems, acne, thin and shiny skin  trouble sleeping  weight gain This list may not describe all possible side effects. Call your doctor for medical advice about side effects. You may report side effects to FDA at 1-800-FDA-1088. Where should I keep my medicine? This medicine is given in a hospital or clinic and will not be stored at home. NOTE: This sheet is a summary. It may not cover all possible information. If you have questions about this medicine, talk to your doctor, pharmacist, or health care provider.  2020 Elsevier/Gold Standard (2018-07-15 13:51:58) Palonosetron Injection What is this medicine? PALONOSETRON (pal oh NOE se tron) is used to prevent nausea and vomiting caused by chemotherapy. It also helps prevent delayed nausea and vomiting that may occur a few days after your treatment. This medicine may be used for other purposes; ask your health care provider or pharmacist if you have questions. COMMON BRAND NAME(S): Aloxi What should I tell my health care provider before I take this medicine? They need to know if you have any of these conditions:  an unusual or allergic reaction to palonosetron,  dolasetron, granisetron, ondansetron, other medicines, foods, dyes, or preservatives  pregnant or trying to get pregnant  breast-feeding How should I use this medicine? This medicine is for infusion into a vein. It is given by a health care professional in a hospital or clinic setting. Talk to your pediatrician regarding the use of this medicine in children. While this drug may be prescribed for children as young as 1 month for selected conditions, precautions do apply. Overdosage: If you think you have taken too much of this medicine contact a poison control center or emergency room at  once. NOTE: This medicine is only for you. Do not share this medicine with others. What if I miss a dose? This does not apply. What may interact with this medicine?  certain medicines for depression, anxiety, or psychotic disturbances  fentanyl  linezolid  MAOIs like Carbex, Eldepryl, Marplan, Nardil, and Parnate  methylene blue (injected into a vein)  tramadol This list may not describe all possible interactions. Give your health care provider a list of all the medicines, herbs, non-prescription drugs, or dietary supplements you use. Also tell them if you smoke, drink alcohol, or use illegal drugs. Some items may interact with your medicine. What should I watch for while using this medicine? Your condition will be monitored carefully while you are receiving this medicine. What side effects may I notice from receiving this medicine? Side effects that you should report to your doctor or health care professional as soon as possible:  allergic reactions like skin rash, itching or hives, swelling of the face, lips, or tongue  breathing problems  confusion  dizziness  fast, irregular heartbeat  fever and chills  loss of balance or coordination  seizures  sweating  swelling of the hands and feet  tremors  unusually weak or tired Side effects that usually do not require medical attention  (report to your doctor or health care professional if they continue or are bothersome):  constipation or diarrhea  headache This list may not describe all possible side effects. Call your doctor for medical advice about side effects. You may report side effects to FDA at 1-800-FDA-1088. Where should I keep my medicine? This drug is given in a hospital or clinic and will not be stored at home. NOTE: This sheet is a summary. It may not cover all possible information. If you have questions about this medicine, talk to your doctor, pharmacist, or health care provider.  2020 Elsevier/Gold Standard (2012-11-07 10:38:36)

## 2019-05-21 ENCOUNTER — Telehealth: Payer: Self-pay | Admitting: *Deleted

## 2019-05-21 LAB — PSA, TOTAL AND FREE
PSA, Free Pct: UNDETERMINED %
PSA, Free: <0.02 ng/mL
Prostate Specific Ag, Serum: <0.1 ng/mL (ref 0.0–4.0)

## 2019-05-21 LAB — CHROMOGRANIN A: Chromogranin A (ng/mL): 723.9 ng/mL — ABNORMAL HIGH (ref 0.0–101.8)

## 2019-05-21 LAB — TESTOSTERONE: Testosterone: <3 ng/dL — ABNORMAL LOW (ref 264–916)

## 2019-05-21 LAB — IRON AND TIBC
Iron: 51 ug/dL (ref 42–163)
Saturation Ratios: 20 % (ref 20–55)
TIBC: 258 ug/dL (ref 202–409)
UIBC: 207 ug/dL (ref 117–376)

## 2019-05-21 LAB — FERRITIN: Ferritin: 295 ng/mL (ref 24–336)

## 2019-05-21 NOTE — Telephone Encounter (Signed)
Pt notified per order of Dr. Marin Olp that "the PSA is not detectable!!  [name]"  Pt appreciative of call and has no questions or concerns at this time.

## 2019-05-21 NOTE — Telephone Encounter (Signed)
-----   Message from [name], MD sent at 05/21/2019  6:48 AM EDT ----- Call - the PSA is not detectable!! Laurey Arrow

## 2019-06-04 NOTE — Progress Notes (Signed)
Pharmacist Chemotherapy Monitoring - Follow Up Assessment    I verify that I have reviewed each item in the below checklist:  . Regimen for the patient is scheduled for the appropriate day and plan matches scheduled date. Marland Kitchen Appropriate non-routine labs are ordered dependent on drug ordered. . If applicable, additional medications reviewed and ordered per protocol based on lifetime cumulative doses and/or treatment regimen.   Plan for follow-up and/or issues identified: No . I-vent associated with next due treatment: No . MD and/or nursing notified: No  [name], [name] 06/04/2019 10:50 AM

## 2019-06-11 ENCOUNTER — Inpatient Hospital Stay: Payer: Medicare Other

## 2019-06-11 ENCOUNTER — Inpatient Hospital Stay (HOSPITAL_BASED_OUTPATIENT_CLINIC_OR_DEPARTMENT_OTHER): Payer: Medicare Other | Admitting: Hematology & Oncology

## 2019-06-11 ENCOUNTER — Encounter: Payer: Self-pay | Admitting: Hematology & Oncology

## 2019-06-11 ENCOUNTER — Other Ambulatory Visit: Payer: Self-pay

## 2019-06-11 VITALS — BP 117/71 | HR 87 | Temp 98.6°F | Resp 18 | Wt 151.0 lb

## 2019-06-11 DIAGNOSIS — C7A8 Other malignant neuroendocrine tumors: Secondary | ICD-10-CM

## 2019-06-11 DIAGNOSIS — D508 Other iron deficiency anemias: Secondary | ICD-10-CM | POA: Diagnosis not present

## 2019-06-11 DIAGNOSIS — D500 Iron deficiency anemia secondary to blood loss (chronic): Secondary | ICD-10-CM | POA: Diagnosis not present

## 2019-06-11 DIAGNOSIS — R32 Unspecified urinary incontinence: Secondary | ICD-10-CM | POA: Diagnosis not present

## 2019-06-11 DIAGNOSIS — C7951 Secondary malignant neoplasm of bone: Secondary | ICD-10-CM

## 2019-06-11 DIAGNOSIS — C61 Malignant neoplasm of prostate: Secondary | ICD-10-CM | POA: Diagnosis not present

## 2019-06-11 DIAGNOSIS — C7B8 Other secondary neuroendocrine tumors: Secondary | ICD-10-CM | POA: Diagnosis not present

## 2019-06-11 DIAGNOSIS — Z5111 Encounter for antineoplastic chemotherapy: Secondary | ICD-10-CM | POA: Diagnosis not present

## 2019-06-11 DIAGNOSIS — K909 Intestinal malabsorption, unspecified: Secondary | ICD-10-CM | POA: Diagnosis not present

## 2019-06-11 LAB — CBC WITH DIFFERENTIAL (CANCER CENTER ONLY)
Abs Immature Granulocytes: 0.03 10*3/uL (ref 0.00–0.07)
Basophils Absolute: 0.1 10*3/uL (ref 0.0–0.1)
Basophils Relative: 1 %
Eosinophils Absolute: 0.5 10*3/uL (ref 0.0–0.5)
Eosinophils Relative: 8 %
HCT: 32.6 % — ABNORMAL LOW (ref 39.0–52.0)
Hemoglobin: 10.6 g/dL — ABNORMAL LOW (ref 13.0–17.0)
Immature Granulocytes: 1 %
Lymphocytes Relative: 14 %
Lymphs Abs: 0.8 10*3/uL (ref 0.7–4.0)
MCH: 33.4 pg (ref 26.0–34.0)
MCHC: 32.5 g/dL (ref 30.0–36.0)
MCV: 102.8 fL — ABNORMAL HIGH (ref 80.0–100.0)
Monocytes Absolute: 0.8 10*3/uL (ref 0.1–1.0)
Monocytes Relative: 13 %
Neutro Abs: 3.9 10*3/uL (ref 1.7–7.7)
Neutrophils Relative %: 63 %
Platelet Count: 322 10*3/uL (ref 150–400)
RBC: 3.17 MIL/uL — ABNORMAL LOW (ref 4.22–5.81)
RDW: 13.4 % (ref 11.5–15.5)
WBC Count: 6.1 10*3/uL (ref 4.0–10.5)
nRBC: 0 % (ref 0.0–0.2)

## 2019-06-11 LAB — CMP (CANCER CENTER ONLY)
ALT: 8 U/L (ref 0–44)
AST: 14 U/L — ABNORMAL LOW (ref 15–41)
Albumin: 4 g/dL (ref 3.5–5.0)
Alkaline Phosphatase: 46 U/L (ref 38–126)
Anion gap: 8 (ref 5–15)
BUN: 22 mg/dL (ref 8–23)
CO2: 25 mmol/L (ref 22–32)
Calcium: 9.2 mg/dL (ref 8.9–10.3)
Chloride: 105 mmol/L (ref 98–111)
Creatinine: 1.25 mg/dL — ABNORMAL HIGH (ref 0.61–1.24)
GFR, Est AFR Am: >60 mL/min (ref >60)
GFR, Estimated: 58 mL/min — ABNORMAL LOW (ref >60)
Glucose, Bld: 142 mg/dL — ABNORMAL HIGH (ref 70–99)
Potassium: 4.4 mmol/L (ref 3.5–5.1)
Sodium: 138 mmol/L (ref 135–145)
Total Bilirubin: 0.3 mg/dL (ref 0.3–1.2)
Total Protein: 6.4 g/dL — ABNORMAL LOW (ref 6.5–8.1)

## 2019-06-11 MED ORDER — HEPARIN SOD (PORK) LOCK FLUSH 100 UNIT/ML IV SOLN
500.0000 [IU] | Freq: Once | INTRAVENOUS | Status: DC | PRN
  Filled 2019-06-11: qty 5

## 2019-06-11 MED ORDER — SODIUM CHLORIDE 0.9 % IV SOLN
750.0000 mg | Freq: Once | INTRAVENOUS | Status: AC
  Administered 2019-06-11: 750 mg via INTRAVENOUS
  Filled 2019-06-11: qty 15

## 2019-06-11 MED ORDER — SODIUM CHLORIDE 0.9 % IV SOLN
Freq: Once | INTRAVENOUS | Status: AC
  Filled 2019-06-11: qty 250

## 2019-06-11 MED ORDER — HEPARIN SOD (PORK) LOCK FLUSH 100 UNIT/ML IV SOLN
500.0000 [IU] | Freq: Once | INTRAVENOUS | Status: AC | PRN
  Administered 2019-06-11: 500 [IU]
  Filled 2019-06-11: qty 5

## 2019-06-11 MED ORDER — SODIUM CHLORIDE 0.9% FLUSH
10.0000 mL | INTRAVENOUS | Status: DC | PRN
  Administered 2019-06-11: 10 mL
  Filled 2019-06-11: qty 10

## 2019-06-11 MED ORDER — SODIUM CHLORIDE 0.9% FLUSH
10.0000 mL | INTRAVENOUS | Status: DC | PRN
  Filled 2019-06-11: qty 10

## 2019-06-11 MED ORDER — PALONOSETRON HCL INJECTION 0.25 MG/5ML
0.2500 mg | Freq: Once | INTRAVENOUS | Status: AC
  Administered 2019-06-11: 0.25 mg via INTRAVENOUS

## 2019-06-11 MED ORDER — SODIUM CHLORIDE 0.9 % IV SOLN
10.0000 mg | Freq: Once | INTRAVENOUS | Status: AC
  Administered 2019-06-11: 10 mg via INTRAVENOUS
  Filled 2019-06-11: qty 10

## 2019-06-11 MED ORDER — SODIUM CHLORIDE 0.9 % IV SOLN
3.2000 mg/m2 | Freq: Once | INTRAVENOUS | Status: AC
  Administered 2019-06-11: 5.8 mg via INTRAVENOUS
  Filled 2019-06-11: qty 11.6

## 2019-06-11 MED ORDER — PALONOSETRON HCL INJECTION 0.25 MG/5ML
INTRAVENOUS | Status: AC
  Filled 2019-06-11: qty 5

## 2019-06-11 NOTE — Progress Notes (Signed)
Pt. Did not want to wait 30 min. post iron infusion. Released stable and ASX.

## 2019-06-11 NOTE — Progress Notes (Signed)
Hematology and Oncology Follow Up Visit  [name] [id number] March 29, 1945 74 y.o. 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 Iron def anemia -- malabsorption   Current Therapy:    Zytiga 1000 mg by mouth daily - discontinued on 08/15/2016  Xtandi 160 mg po q day - start on 08/15/2016 - discontinued  Xgeva 120 mg subcutaneous Q3 months - due in 07/2019  Lupron 22 mg IM every 3 months - due in 07/2019  Radium-223 therapy -- s/p cycle #6  Palliative radiation to the right sacrum  Lutathera - s/p cycle 4/4 --last dose on 07/11/2017  Carbo/VP-16/Tecentriq --  S/p cycle #5  Keytruda q 6 week -- Maintanence -- s/p cycle #3 - changed from 3 to 6 week on 06/30/2018  Taxotere/Keytruda -- start on 09/17/2018 -- s/p cycle #10 -- d/c due to progression  Lurbinectedin --  S/p cycle #2--  started on 04/29/2019  Injectafer 750 mg IV prn     Interim History:  [name] is back for follow-up.  He is little bit tired.  He and his wife moved yesterday.  It was a long day for them.  It was warm outside.  They will spend [place] Day weekend trying to get settled in.  So far, he seems to be tolerating the treatment okay.  He has had a little bit of constipation.  I told him he goes increase the laxatives if necessary.  When we last saw him 3 weeks ago, his saturation for iron was only 20%.  I think that he will benefit from some IV iron.  He has had no cough or shortness of breath.  There is been no nausea or vomiting.  He has had no bleeding.  [name] does have the brace on the right leg.  This seems to be relatively stable.    Overall, I would say his performance status is ECOG 1.     Medications:  Current Outpatient Medications:  .  aspirin 81 MG tablet, Take 81 mg by mouth daily., Disp: , Rfl:  .  Calcium Carbonate-Vitamin D (OYSTER SHELL CALCIUM/D) 250-125 MG-UNIT TABS, Take  by mouth daily. , Disp: , Rfl:  .  CALCIUM-IRON-VIT D-VIT K PO, Take 1 tablet by mouth 2 (two) times daily., Disp: , Rfl:  .  dexamethasone (DECADRON) 4 MG tablet, Take 8 mg by mouth 2 (two) times daily. Takes day of chemo, and 2 days afterwards., Disp: , Rfl:  .  docusate sodium (COLACE) 100 MG capsule, Take 200 mg by mouth 2 (two) times daily., Disp: , Rfl:  .  gabapentin (NEURONTIN) 300 MG capsule, TAKE 1 CAPSULE BY MOUTH TWICE A DAY THEN TAKE 2 CAPSULES AT BEDTIME (Patient taking differently: 2 (two) times daily. ), Disp: 120 capsule, Rfl: 2 .  GENERLAC 10 GM/15ML SOLN, SMARTSIG:45 Milliliter(s) By Mouth Daily PRN, Disp: , Rfl:  .  isosorbide mononitrate (IMDUR) 30 MG 24 hr tablet, Take 30 mg by mouth daily., Disp: , Rfl:  .  lidocaine-prilocaine (EMLA) cream, , Disp: , Rfl:  .  OVER THE COUNTER MEDICATION, every morning. Juice Plus -- 8 capsule of each the garden, vineyard, and orchard twice a day., Disp: , Rfl:  .  pantoprazole (PROTONIX) 40 MG tablet, Take 1 tablet (40 mg total) by mouth at bedtime., Disp: 30 tablet, Rfl: 6 .  prochlorperazine (COMPAZINE) 10 MG tablet, TAKE 1 TABLET (10 MG TOTAL) BY MOUTH EVERY 6 (  SIX) HOURS AS NEEDED (NAUSEA OR VOMITING)., Disp: 30 tablet, Rfl: 1 .  solifenacin (VESICARE) 10 MG tablet, TAKE 1 TABLET BY MOUTH ONCE DAILY, Disp: 30 tablet, Rfl: 5 .  ZETIA 10 MG tablet, Take 10 mg by mouth daily. , Disp: , Rfl:  .  isosorbide mononitrate (IMDUR) 30 MG 24 hr tablet, TAKE 1 TABLET (30 MG TOTAL) BY MOUTH DAILY., Disp: 90 tablet, Rfl: 1 .  nitroGLYCERIN (NITROSTAT) 0.4 MG SL tablet, Place 1 tablet (0.4 mg total) under the tongue every 5 (five) minutes as needed for chest pain. (Patient not taking: Reported on 06/11/2019), Disp: 25 tablet, Rfl: 11 .  ondansetron (ZOFRAN) 8 MG tablet, Take 1 tablet (8 mg total) by mouth 2 (two) times daily as needed for nausea or vomiting. (Patient not taking: Reported on 05/20/2019), Disp: 20 tablet, Rfl: 0 .  sucralfate (CARAFATE) 1  GM/10ML suspension, Take 10 mLs (1 g total) by mouth 4 (four) times daily -  with meals and at bedtime. (Patient not taking: Reported on 05/20/2019), Disp: 420 mL, Rfl: 5 No current facility-administered medications for this visit.  Facility-Administered Medications Ordered in Other Visits:  .  octreotide (SANDOSTATIN LAR) IM injection 30 mg, 30 mg, Intramuscular, Once, Ennever, Rudell Cobb, MD  Allergies:  Allergies  Allergen Reactions  . Codeine Nausea And Vomiting  . Hydrocodone Nausea And Vomiting    Past Medical History, Surgical history, Social history, and Family History were reviewed and updated.  Review of Systems: Review of Systems  Constitutional: Negative.   HENT: Negative.   Eyes: Negative.   Respiratory: Negative.   Cardiovascular: Negative.   Gastrointestinal: Negative.   Genitourinary: Negative.  Negative for urgency.  Musculoskeletal: Positive for joint pain.  Skin: Negative.   Neurological: Positive for focal weakness.  Endo/Heme/Allergies: Negative.   Psychiatric/Behavioral: Negative.   All other systems reviewed and are negative.    Physical Exam:  weight is 151 lb 0.6 oz (68.5 kg). His temporal temperature is 98.6 F (37 C). His blood pressure is 117/71 and his pulse is 87. His respiration is 18 and oxygen saturation is 100%. PET scan  Physical Exam Vitals reviewed.  HENT:     Head: Normocephalic and atraumatic.  Eyes:     Pupils: Pupils are equal, round, and reactive to light.  Cardiovascular:     Rate and Rhythm: Normal rate and regular rhythm.     Heart sounds: Normal heart sounds.  Pulmonary:     Effort: Pulmonary effort is normal.     Breath sounds: Normal breath sounds.  Abdominal:     General: Bowel sounds are normal.     Palpations: Abdomen is soft.  Musculoskeletal:        General: No tenderness or deformity. Normal range of motion.     Cervical back: Normal range of motion.     Comments: His extremities shows the brace on the right lower  leg.  He has some muscle atrophy in the right lower leg.  Left leg is unremarkable.  He has good range of motion of his joints.  He has good pulses in his distal extremities.  Lymphadenopathy:     Cervical: No cervical adenopathy.  Skin:    General: Skin is warm and dry.     Findings: No erythema or rash.  Neurological:     Mental Status: He is alert and oriented to person, place, and time.  Psychiatric:        Behavior: Behavior normal.  Thought Content: Thought content normal.        Judgment: Judgment normal.     Lab Results  Component Value Date   WBC 7.6 05/20/2019   HGB 10.6 (L) 05/20/2019   HCT 32.7 (L) 05/20/2019   MCV 102.2 (H) 05/20/2019   PLT 324 05/20/2019     Chemistry      Component Value Date/Time   NA 135 05/20/2019 1012   NA 144 12/14/2016 1159   NA 140 10/22/2016 1129   K 4.9 05/20/2019 1012   K 3.5 12/14/2016 1159   K 4.3 10/22/2016 1129   CL 101 05/20/2019 1012   CL 103 12/14/2016 1159   CO2 27 05/20/2019 1012   CO2 30 12/14/2016 1159   CO2 27 10/22/2016 1129   BUN 25 (H) 05/20/2019 1012   BUN 16 12/14/2016 1159   BUN 14.0 10/22/2016 1129   CREATININE 1.36 (H) 05/20/2019 1012   CREATININE 1.3 (H) 12/14/2016 1159   CREATININE 1.1 10/22/2016 1129      Component Value Date/Time   CALCIUM 9.3 05/20/2019 1012   CALCIUM 10.0 12/14/2016 1159   CALCIUM 10.4 10/22/2016 1129   ALKPHOS 60 05/20/2019 1012   ALKPHOS 48 12/14/2016 1159   ALKPHOS 60 10/22/2016 1129   AST 12 (L) 05/20/2019 1012   AST 16 10/22/2016 1129   ALT 10 05/20/2019 1012   ALT 23 12/14/2016 1159   ALT 14 10/22/2016 1129   BILITOT 0.3 05/20/2019 1012   BILITOT 0.31 10/22/2016 1129      Impression and Plan: [name] is 74 year old gentleman with 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  We will now plan for third cycle of the chemotherapy today.  Hopefully, he will tolerate this well.    I will give a dose of iron today.  His iron last time was on  the low side.  I think this will help give him a little bit of energy and stamina.  I we will go ahead and set him up with a dotatate PET scan after this cycle so we can tell how he is responding.    We will plan to get him back here in another 3 weeks.      [name], MD 5/27/20218:33 AM

## 2019-06-11 NOTE — Patient Instructions (Signed)

## 2019-06-12 LAB — LACTATE DEHYDROGENASE: LDH: 181 U/L (ref 98–192)

## 2019-06-16 MED FILL — GENERLAC 10 GM/15 ML SOLN: 10 | 42 days supply | Qty: 1892 | Fill #1

## 2019-06-18 MED FILL — SOLIFENACIN SUCCINATE 10 MG: 10 | 30 days supply | Qty: 30 | Fill #2

## 2019-06-24 NOTE — Progress Notes (Signed)
Pharmacist Chemotherapy Monitoring - Follow Up Assessment    I verify that I have reviewed each item in the below checklist:   Regimen for the patient is scheduled for the appropriate day and plan matches scheduled date.  Appropriate non-routine labs are ordered dependent on drug ordered.  If applicable, additional medications reviewed and ordered per protocol based on lifetime cumulative doses and/or treatment regimen.   Plan for follow-up and/or issues identified: No  I-vent associated with next due treatment: No  MD and/or nursing notified: No  [name], [name] 06/24/2019 8:01 AM

## 2019-06-29 ENCOUNTER — Ambulatory Visit (HOSPITAL_COMMUNITY)
Admission: RE | Admit: 2019-06-29 | Discharge: 2019-06-29 | Disposition: A | Discharge status: Home / Self Care | Payer: Medicare Other | Source: Ambulatory Visit | Attending: Hematology & Oncology | Admitting: Hematology & Oncology

## 2019-06-29 ENCOUNTER — Encounter: Payer: Self-pay | Admitting: *Deleted

## 2019-06-29 ENCOUNTER — Other Ambulatory Visit: Payer: Self-pay

## 2019-06-29 DIAGNOSIS — H52223 Regular astigmatism, bilateral: Secondary | ICD-10-CM | POA: Diagnosis not present

## 2019-06-29 DIAGNOSIS — H524 Presbyopia: Secondary | ICD-10-CM | POA: Diagnosis not present

## 2019-06-29 DIAGNOSIS — H5203 Hypermetropia, bilateral: Secondary | ICD-10-CM | POA: Diagnosis not present

## 2019-06-29 DIAGNOSIS — Z8546 Personal history of malignant neoplasm of prostate: Secondary | ICD-10-CM | POA: Diagnosis not present

## 2019-06-29 DIAGNOSIS — C7A8 Other malignant neuroendocrine tumors: Secondary | ICD-10-CM | POA: Diagnosis present

## 2019-06-29 DIAGNOSIS — H25813 Combined forms of age-related cataract, bilateral: Secondary | ICD-10-CM | POA: Diagnosis not present

## 2019-06-29 DIAGNOSIS — C3490 Malignant neoplasm of unspecified part of unspecified bronchus or lung: Secondary | ICD-10-CM | POA: Diagnosis not present

## 2019-06-29 MED ORDER — GALLIUM GA 68 DOTATATE IV KIT
3.7000 | PACK | Freq: Once | INTRAVENOUS | Status: AC | PRN
  Administered 2019-06-29: 3.7 via INTRAVENOUS

## 2019-07-01 ENCOUNTER — Inpatient Hospital Stay: Payer: Medicare Other

## 2019-07-01 ENCOUNTER — Inpatient Hospital Stay: Payer: Medicare Other | Attending: Hematology & Oncology

## 2019-07-01 ENCOUNTER — Other Ambulatory Visit: Payer: Self-pay

## 2019-07-01 ENCOUNTER — Encounter: Payer: Self-pay | Admitting: Hematology & Oncology

## 2019-07-01 ENCOUNTER — Other Ambulatory Visit: Payer: Self-pay | Admitting: Cardiology

## 2019-07-01 ENCOUNTER — Inpatient Hospital Stay (HOSPITAL_BASED_OUTPATIENT_CLINIC_OR_DEPARTMENT_OTHER): Payer: Medicare Other | Admitting: Hematology & Oncology

## 2019-07-01 ENCOUNTER — Telehealth: Payer: Self-pay

## 2019-07-01 VITALS — BP 115/66 | HR 82 | Temp 96.9°F | Resp 18 | Wt 147.0 lb

## 2019-07-01 DIAGNOSIS — C7B8 Other secondary neuroendocrine tumors: Secondary | ICD-10-CM | POA: Diagnosis not present

## 2019-07-01 DIAGNOSIS — C7A8 Other malignant neuroendocrine tumors: Secondary | ICD-10-CM

## 2019-07-01 DIAGNOSIS — Z885 Allergy status to narcotic agent status: Secondary | ICD-10-CM | POA: Diagnosis not present

## 2019-07-01 DIAGNOSIS — M6250 Muscle wasting and atrophy, not elsewhere classified, unspecified site: Secondary | ICD-10-CM | POA: Insufficient documentation

## 2019-07-01 DIAGNOSIS — Z5111 Encounter for antineoplastic chemotherapy: Secondary | ICD-10-CM | POA: Insufficient documentation

## 2019-07-01 DIAGNOSIS — K909 Intestinal malabsorption, unspecified: Secondary | ICD-10-CM | POA: Insufficient documentation

## 2019-07-01 DIAGNOSIS — Z79899 Other long term (current) drug therapy: Secondary | ICD-10-CM | POA: Diagnosis not present

## 2019-07-01 DIAGNOSIS — D500 Iron deficiency anemia secondary to blood loss (chronic): Secondary | ICD-10-CM

## 2019-07-01 DIAGNOSIS — C61 Malignant neoplasm of prostate: Secondary | ICD-10-CM | POA: Insufficient documentation

## 2019-07-01 DIAGNOSIS — R531 Weakness: Secondary | ICD-10-CM | POA: Insufficient documentation

## 2019-07-01 DIAGNOSIS — D508 Other iron deficiency anemias: Secondary | ICD-10-CM | POA: Diagnosis not present

## 2019-07-01 DIAGNOSIS — C7951 Secondary malignant neoplasm of bone: Secondary | ICD-10-CM

## 2019-07-01 DIAGNOSIS — M2550 Pain in unspecified joint: Secondary | ICD-10-CM | POA: Insufficient documentation

## 2019-07-01 DIAGNOSIS — R918 Other nonspecific abnormal finding of lung field: Secondary | ICD-10-CM | POA: Insufficient documentation

## 2019-07-01 LAB — FERRITIN: Ferritin: 1938 ng/mL — ABNORMAL HIGH (ref 24–336)

## 2019-07-01 LAB — CBC WITH DIFFERENTIAL (CANCER CENTER ONLY)
Abs Immature Granulocytes: 0.02 10*3/uL (ref 0.00–0.07)
Basophils Absolute: 0.1 10*3/uL (ref 0.0–0.1)
Basophils Relative: 1 %
Eosinophils Absolute: 0.4 10*3/uL (ref 0.0–0.5)
Eosinophils Relative: 8 %
HCT: 34.3 % — ABNORMAL LOW (ref 39.0–52.0)
Hemoglobin: 11.2 g/dL — ABNORMAL LOW (ref 13.0–17.0)
Immature Granulocytes: 0 %
Lymphocytes Relative: 19 %
Lymphs Abs: 0.9 10*3/uL (ref 0.7–4.0)
MCH: 33.6 pg (ref 26.0–34.0)
MCHC: 32.7 g/dL (ref 30.0–36.0)
MCV: 103 fL — ABNORMAL HIGH (ref 80.0–100.0)
Monocytes Absolute: 0.7 10*3/uL (ref 0.1–1.0)
Monocytes Relative: 14 %
Neutro Abs: 2.7 10*3/uL (ref 1.7–7.7)
Neutrophils Relative %: 58 %
Platelet Count: 295 10*3/uL (ref 150–400)
RBC: 3.33 MIL/uL — ABNORMAL LOW (ref 4.22–5.81)
RDW: 13.5 % (ref 11.5–15.5)
WBC Count: 4.8 10*3/uL (ref 4.0–10.5)
nRBC: 0 % (ref 0.0–0.2)

## 2019-07-01 LAB — CMP (CANCER CENTER ONLY)
ALT: 10 U/L (ref 0–44)
AST: 14 U/L — ABNORMAL LOW (ref 15–41)
Albumin: 4 g/dL (ref 3.5–5.0)
Alkaline Phosphatase: 55 U/L (ref 38–126)
Anion gap: 8 (ref 5–15)
BUN: 21 mg/dL (ref 8–23)
CO2: 26 mmol/L (ref 22–32)
Calcium: 9.6 mg/dL (ref 8.9–10.3)
Chloride: 104 mmol/L (ref 98–111)
Creatinine: 1.43 mg/dL — ABNORMAL HIGH (ref 0.61–1.24)
GFR, Est AFR Am: 57 mL/min — ABNORMAL LOW (ref >60)
GFR, Estimated: 49 mL/min — ABNORMAL LOW (ref >60)
Glucose, Bld: 120 mg/dL — ABNORMAL HIGH (ref 70–99)
Potassium: 4.2 mmol/L (ref 3.5–5.1)
Sodium: 138 mmol/L (ref 135–145)
Total Bilirubin: 0.3 mg/dL (ref 0.3–1.2)
Total Protein: 6.4 g/dL — ABNORMAL LOW (ref 6.5–8.1)

## 2019-07-01 LAB — IRON AND TIBC
Iron: 112 ug/dL (ref 42–163)
Saturation Ratios: 48 % (ref 20–55)
TIBC: 236 ug/dL (ref 202–409)
UIBC: 124 ug/dL (ref 117–376)

## 2019-07-01 LAB — LACTATE DEHYDROGENASE: LDH: 178 U/L (ref 98–192)

## 2019-07-01 MED ORDER — SODIUM CHLORIDE 0.9 % IV SOLN
3.2000 mg/m2 | Freq: Once | INTRAVENOUS | Status: AC
  Administered 2019-07-01: 5.8 mg via INTRAVENOUS
  Filled 2019-07-01: qty 11.6

## 2019-07-01 MED ORDER — SODIUM CHLORIDE 0.9% FLUSH
10.0000 mL | INTRAVENOUS | Status: DC | PRN
  Administered 2019-07-01: 10 mL
  Filled 2019-07-01: qty 10

## 2019-07-01 MED ORDER — HEPARIN SOD (PORK) LOCK FLUSH 100 UNIT/ML IV SOLN
500.0000 [IU] | Freq: Once | INTRAVENOUS | Status: AC | PRN
  Administered 2019-07-01: 500 [IU]
  Filled 2019-07-01: qty 5

## 2019-07-01 MED ORDER — SODIUM CHLORIDE 0.9 % IV SOLN
10.0000 mg | Freq: Once | INTRAVENOUS | Status: AC
  Administered 2019-07-01: 10 mg via INTRAVENOUS
  Filled 2019-07-01: qty 1

## 2019-07-01 MED ORDER — PROCHLORPERAZINE MALEATE 10 MG PO TABS
ORAL_TABLET | ORAL | Status: AC
  Filled 2019-07-01: qty 1

## 2019-07-01 MED ORDER — ISOSORBIDE MONONITRATE ER 30 MG PO TB24: 30.0000 mg | ORAL_TABLET | Freq: Every day | ORAL | 2 refills | Status: DC

## 2019-07-01 MED ORDER — PALONOSETRON HCL INJECTION 0.25 MG/5ML
0.2500 mg | Freq: Once | INTRAVENOUS | Status: AC
  Administered 2019-07-01: 0.25 mg via INTRAVENOUS

## 2019-07-01 MED ORDER — PALONOSETRON HCL INJECTION 0.25 MG/5ML
INTRAVENOUS | Status: AC
  Filled 2019-07-01: qty 5

## 2019-07-01 MED ORDER — SODIUM CHLORIDE 0.9 % IV SOLN
Freq: Once | INTRAVENOUS | Status: AC
  Filled 2019-07-01: qty 250

## 2019-07-01 MED FILL — PANTOPRAZOLE SOD DR 40 MG T: 40 | 30 days supply | Qty: 30 | Fill #6

## 2019-07-01 MED FILL — ISOSORBIDE MN ER 30 MG TAB: 30 | 90 days supply | Qty: 90 | Fill #0

## 2019-07-01 MED FILL — GABAPENTIN 300 MG CAPSULE: 300 | 30 days supply | Qty: 120 | Fill #1

## 2019-07-01 NOTE — Telephone Encounter (Signed)
Refill sent in per faxed request 

## 2019-07-01 NOTE — Patient Instructions (Signed)
Palonosetron Injection What is this medicine? PALONOSETRON (pal oh NOE se tron) is used to prevent nausea and vomiting caused by chemotherapy. It also helps prevent delayed nausea and vomiting that may occur a few days after your treatment. This medicine may be used for other purposes; ask your health care provider or pharmacist if you have questions. COMMON BRAND NAME(S): Aloxi What should I tell my health care provider before I take this medicine? They need to know if you have any of these conditions:  an unusual or allergic reaction to palonosetron, dolasetron, granisetron, ondansetron, other medicines, foods, dyes, or preservatives  pregnant or trying to get pregnant  breast-feeding How should I use this medicine? This medicine is for infusion into a vein. It is given by a health care professional in a hospital or clinic setting. Talk to your pediatrician regarding the use of this medicine in children. While this drug may be prescribed for children as young as 1 month for selected conditions, precautions do apply. Overdosage: If you think you have taken too much of this medicine contact a poison control center or emergency room at once. NOTE: This medicine is only for you. Do not share this medicine with others. What if I miss a dose? This does not apply. What may interact with this medicine?  certain medicines for depression, anxiety, or psychotic disturbances  fentanyl  linezolid  MAOIs like Carbex, Eldepryl, Marplan, Nardil, and Parnate  methylene blue (injected into a vein)  tramadol This list may not describe all possible interactions. Give your health care provider a list of all the medicines, herbs, non-prescription drugs, or dietary supplements you use. Also tell them if you smoke, drink alcohol, or use illegal drugs. Some items may interact with your medicine. What should I watch for while using this medicine? Your condition will be monitored carefully while you are  receiving this medicine. What side effects may I notice from receiving this medicine? Side effects that you should report to your doctor or health care professional as soon as possible:  allergic reactions like skin rash, itching or hives, swelling of the face, lips, or tongue  breathing problems  confusion  dizziness  fast, irregular heartbeat  fever and chills  loss of balance or coordination  seizures  sweating  swelling of the hands and feet  tremors  unusually weak or tired Side effects that usually do not require medical attention (report to your doctor or health care professional if they continue or are bothersome):  constipation or diarrhea  headache This list may not describe all possible side effects. Call your doctor for medical advice about side effects. You may report side effects to FDA at 1-800-FDA-1088. Where should I keep my medicine? This drug is given in a hospital or clinic and will not be stored at home. NOTE: This sheet is a summary. It may not cover all possible information. If you have questions about this medicine, talk to your doctor, pharmacist, or health care provider.  2020 Elsevier/Gold Standard (2012-11-07 10:38:36) Dexamethasone injection What is this medicine? DEXAMETHASONE (dex a METH a sone) is a corticosteroid. It is used to treat inflammation of the skin, joints, lungs, and other organs. Common conditions treated include asthma, allergies, and arthritis. It is also used for other conditions, like blood disorders and diseases of the adrenal glands. This medicine may be used for other purposes; ask your health care provider or pharmacist if you have questions. COMMON BRAND NAME(S): Decadron, DoubleDex, Simplist Dexamethasone, Solurex What should I  tell my health care provider before I take this medicine? They need to know if you have any of these conditions:  Cushing's syndrome  diabetes  glaucoma  heart disease  high blood  pressure  infection like herpes, measles, tuberculosis, or chickenpox  kidney disease  liver disease  mental illness  myasthenia gravis  osteoporosis  previous heart attack  seizures  stomach or intestine problems  thyroid disease  an unusual or allergic reaction to dexamethasone, corticosteroids, other medicines, lactose, foods, dyes, or preservatives  pregnant or trying to get pregnant  breast-feeding How should I use this medicine? This medicine is for injection into a muscle, joint, lesion, soft tissue, or vein. It is given by a health care professional in a hospital or clinic setting. Talk to your pediatrician regarding the use of this medicine in children. Special care may be needed. Overdosage: If you think you have taken too much of this medicine contact a poison control center or emergency room at once. NOTE: This medicine is only for you. Do not share this medicine with others. What if I miss a dose? This may not apply. If you are having a series of injections over a prolonged period, try not to miss an appointment. Call your doctor or health care professional to reschedule if you are unable to keep an appointment. What may interact with this medicine? Do not take this medicine with any of the following medications:  live virus vaccines This medicine may also interact with the following medications:  aminoglutethimide  amphotericin B  aspirin and aspirin-like medicines  certain antibiotics like erythromycin, clarithromycin, and troleandomycin  certain antivirals for HIV or hepatitis  certain medicines for seizures like carbamazepine, phenobarbital, phenytoin  certain medicines to treat myasthenia gravis  cholestyramine  cyclosporine  digoxin  diuretics  ephedrine  male hormones, like estrogen or progestins and birth control pills  insulin or other medicines for diabetes  isoniazid  ketoconazole  medicines that relax muscles for  surgery  mifepristone  NSAIDs, medicines for pain and inflammation, like ibuprofen or naproxen  rifampin  skin tests for allergies  thalidomide  vaccines  warfarin This list may not describe all possible interactions. Give your health care provider a list of all the medicines, herbs, non-prescription drugs, or dietary supplements you use. Also tell them if you smoke, drink alcohol, or use illegal drugs. Some items may interact with your medicine. What should I watch for while using this medicine? Visit your health care professional for regular checks on your progress. Tell your health care professional if your symptoms do not start to get better or if they get worse. Your condition will be monitored carefully while you are receiving this medicine. Wear a medical ID bracelet or chain. Carry a card that describes your disease and details of your medicine and dosage times. This medicine may increase your risk of getting an infection. Call your health care professional for advice if you get a fever, chills, or sore throat, or other symptoms of a cold or flu. Do not treat yourself. Try to avoid being around people who are sick. Call your health care professional if you are around anyone with measles, chickenpox, or if you develop sores or blisters that do not heal properly. If you are going to need surgery or other procedures, tell your doctor or health care professional that you have taken this medicine within the last 12 months. Ask your doctor or health care professional about your diet. You may need to lower  the amount of salt you eat. This medicine may increase blood sugar. Ask your healthcare provider if changes in diet or medicines are needed if you have diabetes. What side effects may I notice from receiving this medicine? Side effects that you should report to your doctor or health care professional as soon as possible:  allergic reactions like skin rash, itching or hives, swelling of  the face, lips, or tongue  bloody or black, tarry stools  changes in emotions or moods  changes in vision  confusion, excitement, restlessness  depressed mood  eye pain  hallucinations  muscle weakness  severe or sudden stomach or belly pain  signs and symptoms of high blood sugar such as being more thirsty or hungry or having to urinate more than normal. You may also feel very tired or have blurry vision.  signs and symptoms of infection like fever; chills; cough; sore throat; pain or trouble passing urine  swelling of ankles, feet  unusual bruising or bleeding  wounds that do not heal Side effects that usually do not require medical attention (report to your doctor or health care professional if they continue or are bothersome):  increased appetite  increased growth of face or body hair  headache  nausea, vomiting  pain, redness, or irritation at site where injected  skin problems, acne, thin and shiny skin  trouble sleeping  weight gain This list may not describe all possible side effects. Call your doctor for medical advice about side effects. You may report side effects to FDA at 1-800-FDA-1088. Where should I keep my medicine? This medicine is given in a hospital or clinic and will not be stored at home. NOTE: This sheet is a summary. It may not cover all possible information. If you have questions about this medicine, talk to your doctor, pharmacist, or health care provider.  2020 Elsevier/Gold Standard (2018-07-15 13:51:58) Lurbinectedin Injection What is this medicine? LURBINECTEDIN (LOOR bin EK te din) is a chemotherapy drug. This medicine is used to treat lung cancer. This medicine may be used for other purposes; ask your health care provider or pharmacist if you have questions. COMMON BRAND NAME(S): ZEPZELCA What should I tell my health care provider before I take this medicine? They need to know if you have any of these conditions:  infection  (especially a viral infection such as chickenpox, cold sores, or herpes)  liver disease  low blood counts, like white cells, platelets, or red blood cells  an unusual or allergic reaction to lurbinectedin, other medicines, foods, dyes or preservatives  pregnant or trying to get pregnant  breast-feeding How should I use this medicine? This medicine is for infusion into a vein. It is given by a healthcare professional in a hospital or clinic setting. Talk to your pediatrician about the use of this medicine in children. Special care may be needed. Overdosage: If you think you have taken too much of this medicine contact a poison control center or emergency room at once. NOTE: This medicine is only for you. Do not share this medicine with others. What if I miss a dose? Keep appointments for follow-up doses. It is important not to miss your dose. Call your doctor or health care professional if you are unable to keep an appointment. What may interact with this medicine?  certain antibiotics like erythromycin or clarithromycin  certain antivirals for HIV or hepatitis  certain medicines for fungal infections like ketoconazole, itraconazole, or posaconazole  certain medicines for seizures like carbamazepine, phenobarbital, phenytoin  grapefruit or grapefruit juice  St. John's Wort This list may not describe all possible interactions. Give your health care provider a list of all the medicines, herbs, non-prescription drugs, or dietary supplements you use. Also tell them if you smoke, drink alcohol, or use illegal drugs. Some items may interact with your medicine. What should I watch for while using this medicine? Your condition will be monitored carefully while you are receiving this medicine. Do not become pregnant while taking this medicine or for 6 months after stopping it. Women should inform their health care professional if they wish to become pregnant or think they might be pregnant.  Men should not father a child while taking this medicine and for 4 months after stopping it. There is potential for serious side effects to an unborn child. Talk to your health care professional for more information. Do not breast-feed a child while taking this medicine or for 2 weeks after stopping it. Call your health care professional for advice if you get a fever, chills, or sore throat, or other symptoms of a cold or flu. Do not treat yourself. This medicine decreases your body's ability to fight infections. Try to avoid being around people who are sick. Avoid taking medicines that contain aspirin, acetaminophen, ibuprofen, naproxen, or ketoprofen unless instructed by your health care professional. These medicines may hide a fever. Be careful brushing or flossing your teeth or using a toothpick because you may get an infection or bleed more easily. If you have any dental work done, tell your dentist you are receiving this medicine. What side effects may I notice from receiving this medicine? Side effects that you should report to your doctor or health care professional as soon as possible:  allergic reactions like skin rash, itching or hives; swelling of the face, lips, or tongue  chest pain  nausea, vomiting  signs and symptoms of bleeding such as bloody or black, tarry stools; red or dark-brown urine; spitting up blood or brown material that looks like coffee grounds; red spots on the skin; unusual bruising or bleeding from the eyes, gums, or nose  signs and symptoms of infection like fever; chills; cough; sore throat; pain or trouble passing urine  signs and symptoms of liver injury like dark yellow or brown urine; general ill feeling or flu-like symptoms; light-colored stools; loss of appetite; nausea; right upper belly pain; unusually weak or tired; yellowing of the eyes or skin Side effects that usually do not require medical attention (report these to your doctor or health care  professional if they continue or are bothersome):  changes in taste  constipation  diarrhea  loss of appetite  muscle pain  pain, tingling, numbness in the hands or feet  signs and symptoms of high blood sugar such as being more thirsty or hungry or having to urinate more than normal. You may also feel very tired or have blurry vision.  signs and symptoms of low magnesium like muscle cramps; muscle pain; muscle weakness; tremors; seizures; or fast, irregular heartbeat  signs and symptoms of low red blood cells or anemia such as unusually weak or tired; feeling faint or lightheaded; falls; breathing problems  stomach pain This list may not describe all possible side effects. Call your doctor for medical advice about side effects. You may report side effects to FDA at 1-800-FDA-1088. Where should I keep my medicine? This medicine is given in a hospital or clinic and will not be stored at home. NOTE: This sheet is a summary.  It may not cover all possible information. If you have questions about this medicine, talk to your doctor, pharmacist, or health care provider.  2020 Elsevier/Gold Standard (2018-07-03 13:00:33)

## 2019-07-01 NOTE — Progress Notes (Signed)
Hematology and Oncology Follow Up Visit  [name] [id number] [birth year] 74 y.o. 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 Iron def anemia -- malabsorption   Current Therapy:    Zytiga 1000 mg by mouth daily - discontinued on 08/15/2016  Xtandi 160 mg po q day - start on 08/15/2016 - discontinued  Xgeva 120 mg subcutaneous Q3 months - due in 07/2019  Lupron 22 mg IM every 3 months - due in 07/2019  Radium-223 therapy -- s/p cycle #6  Palliative radiation to the right sacrum  Lutathera - s/p cycle 4/4 --last dose on 07/11/2017  Carbo/VP-16/Tecentriq --  S/p cycle #5  Keytruda q 6 week -- Maintanence -- s/p cycle #3 - changed from 3 to 6 week on 06/30/2018  Taxotere/Keytruda -- start on 09/17/2018 -- s/p cycle #10 -- d/c due to progression  Lurbinectedin --  S/p cycle #3--  started on 04/29/2019  Injectafer 750 mg IV prn     Interim History:  Mr.  Palos is back for follow-up.  He looks quite good.  He and his wife enjoy their new house.  It is certainly a lot easier for them since it is 1 level.  The great news is that we did a dotatate PET scan on him.  This was done last week.  It shows that he is responding nicely to treatment.  He has a decrease in the pulmonary nodules.  I am just so happy about this.  He has had no issues with the pain.  Is going to the bathroom okay.  There is no hematuria.  He has had no problems with nausea or vomiting.  For the about 4 days after treatment, he does have a decreased appetite.  He has had no skin rashes.  There is been no headaches.  His blood sugars have been under good control.  He has had no fever.  He has had no cough or shortness of breath.  He has had no chest wall pain.  Apparently, his family wants him checked for the MTHFR mutation.  Apparently one of their daughters has it.  A granddaughter who lost some  twins also has it.  Currently, his performance status is ECOG 1.      Medications:  Current Outpatient Medications:  .  aspirin 81 MG tablet, Take 81 mg by mouth daily., Disp: , Rfl:  .  Calcium Carbonate-Vitamin D (OYSTER SHELL CALCIUM/D) 250-125 MG-UNIT TABS, Take by mouth daily. , Disp: , Rfl:  .  CALCIUM-IRON-VIT D-VIT K PO, Take 1 tablet by mouth 2 (two) times daily., Disp: , Rfl:  .  dexamethasone (DECADRON) 4 MG tablet, Take 8 mg by mouth 2 (two) times daily. Takes day of chemo, and 2 days afterwards., Disp: , Rfl:  .  docusate sodium (COLACE) 100 MG capsule, Take 200 mg by mouth 2 (two) times daily., Disp: , Rfl:  .  gabapentin (NEURONTIN) 300 MG capsule, TAKE 1 CAPSULE BY MOUTH TWICE A DAY THEN TAKE 2 CAPSULES AT BEDTIME (Patient taking differently: 2 (two) times daily. ), Disp: 120 capsule, Rfl: 2 .  GENERLAC 10 GM/15ML SOLN, SMARTSIG:45 Milliliter(s) By Mouth Daily PRN, Disp: , Rfl:  .  isosorbide mononitrate (IMDUR) 30 MG 24 hr tablet, Take 30 mg by mouth daily., Disp: , Rfl:  .  lidocaine-prilocaine (EMLA) cream, , Disp: , Rfl:  .  OVER THE COUNTER MEDICATION, every morning. Juice Plus -- 8  capsule of each the garden, vineyard, and orchard twice a day., Disp: , Rfl:  .  pantoprazole (PROTONIX) 40 MG tablet, Take 1 tablet (40 mg total) by mouth at bedtime., Disp: 30 tablet, Rfl: 6 .  prochlorperazine (COMPAZINE) 10 MG tablet, TAKE 1 TABLET (10 MG TOTAL) BY MOUTH EVERY 6 (SIX) HOURS AS NEEDED (NAUSEA OR VOMITING)., Disp: 30 tablet, Rfl: 1 .  solifenacin (VESICARE) 10 MG tablet, TAKE 1 TABLET BY MOUTH ONCE DAILY, Disp: 30 tablet, Rfl: 5 .  ZETIA 10 MG tablet, Take 10 mg by mouth daily. , Disp: , Rfl:  .  isosorbide mononitrate (IMDUR) 30 MG 24 hr tablet, TAKE 1 TABLET (30 MG TOTAL) BY MOUTH DAILY., Disp: 90 tablet, Rfl: 1 .  nitroGLYCERIN (NITROSTAT) 0.4 MG SL tablet, Place 1 tablet (0.4 mg total) under the tongue every 5 (five) minutes as needed for chest pain. (Patient not taking:  Reported on 06/11/2019), Disp: 25 tablet, Rfl: 11 .  ondansetron (ZOFRAN) 8 MG tablet, Take 1 tablet (8 mg total) by mouth 2 (two) times daily as needed for nausea or vomiting. (Patient not taking: Reported on 05/20/2019), Disp: 20 tablet, Rfl: 0 .  sucralfate (CARAFATE) 1 GM/10ML suspension, Take 10 mLs (1 g total) by mouth 4 (four) times daily -  with meals and at bedtime. (Patient not taking: Reported on 05/20/2019), Disp: 420 mL, Rfl: 5 No current facility-administered medications for this visit.  Facility-Administered Medications Ordered in Other Visits:  .  octreotide (SANDOSTATIN LAR) IM injection 30 mg, 30 mg, Intramuscular, Once, Miu Chiong, Rudell Cobb, MD  Allergies:  Allergies  Allergen Reactions  . Codeine Nausea And Vomiting  . Hydrocodone Nausea And Vomiting    Past Medical History, Surgical history, Social history, and Family History were reviewed and updated.  Review of Systems: Review of Systems  Constitutional: Negative.   HENT: Negative.   Eyes: Negative.   Respiratory: Negative.   Cardiovascular: Negative.   Gastrointestinal: Negative.   Genitourinary: Negative.  Negative for urgency.  Musculoskeletal: Positive for joint pain.  Skin: Negative.   Neurological: Positive for focal weakness.  Endo/Heme/Allergies: Negative.   Psychiatric/Behavioral: Negative.   All other systems reviewed and are negative.    Physical Exam:  weight is 147 lb (66.7 kg). His temporal temperature is 96.9 F (36.1 C) (abnormal). His blood pressure is 115/66 and his pulse is 82. His respiration is 18 and oxygen saturation is 100%. PET scan  Physical Exam Vitals reviewed.  HENT:     Head: Normocephalic and atraumatic.  Eyes:     Pupils: Pupils are equal, round, and reactive to light.  Cardiovascular:     Rate and Rhythm: Normal rate and regular rhythm.     Heart sounds: Normal heart sounds.  Pulmonary:     Effort: Pulmonary effort is normal.     Breath sounds: Normal breath sounds.    Abdominal:     General: Bowel sounds are normal.     Palpations: Abdomen is soft.  Musculoskeletal:        General: No tenderness or deformity. Normal range of motion.     Cervical back: Normal range of motion.     Comments: His extremities shows the brace on the right lower leg.  He has some muscle atrophy in the right lower leg.  Left leg is unremarkable.  He has good range of motion of his joints.  He has good pulses in his distal extremities.  Lymphadenopathy:     Cervical: No cervical adenopathy.  Skin:    General: Skin is warm and dry.     Findings: No erythema or rash.  Neurological:     Mental Status: He is alert and oriented to person, place, and time.  Psychiatric:        Behavior: Behavior normal.        Thought Content: Thought content normal.        Judgment: Judgment normal.     Lab Results  Component Value Date   WBC 4.8 07/01/2019   HGB 11.2 (L) 07/01/2019   HCT 34.3 (L) 07/01/2019   MCV 103.0 (H) 07/01/2019   PLT 295 07/01/2019     Chemistry      Component Value Date/Time   NA 138 06/11/2019 0835   NA 144 12/14/2016 1159   NA 140 10/22/2016 1129   K 4.4 06/11/2019 0835   K 3.5 12/14/2016 1159   K 4.3 10/22/2016 1129   CL 105 06/11/2019 0835   CL 103 12/14/2016 1159   CO2 25 06/11/2019 0835   CO2 30 12/14/2016 1159   CO2 27 10/22/2016 1129   BUN 22 06/11/2019 0835   BUN 16 12/14/2016 1159   BUN 14.0 10/22/2016 1129   CREATININE 1.25 (H) 06/11/2019 0835   CREATININE 1.3 (H) 12/14/2016 1159   CREATININE 1.1 10/22/2016 1129      Component Value Date/Time   CALCIUM 9.2 06/11/2019 0835   CALCIUM 10.0 12/14/2016 1159   CALCIUM 10.4 10/22/2016 1129   ALKPHOS 46 06/11/2019 0835   ALKPHOS 48 12/14/2016 1159   ALKPHOS 60 10/22/2016 1129   AST 14 (L) 06/11/2019 0835   AST 16 10/22/2016 1129   ALT 8 06/11/2019 0835   ALT 23 12/14/2016 1159   ALT 14 10/22/2016 1129   BILITOT 0.3 06/11/2019 0835   BILITOT 0.31 10/22/2016 1129      Impression and  Plan: Mr. 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  As treatment is working, we will now plan for his fourth  cycle of the chemotherapy today.  Hopefully, he will tolerate this well.    I forgot to mention that I got IV iron with his last visit.  His iron saturation was 20%.  His hemoglobin is better.  I think the IV iron helped him.  We will try to plan for 3 or 4 more cycles of treatment before we do another scan on him.  We will plan to get him back in 3 weeks.     [name], MD 6/16/20218:58 AM

## 2019-07-01 NOTE — Patient Instructions (Signed)

## 2019-07-02 LAB — CHROMOGRANIN A: Chromogranin A (ng/mL): 1092 ng/mL — ABNORMAL HIGH (ref 0.0–101.8)

## 2019-07-06 LAB — MTHFR DNA ANALYSIS

## 2019-07-07 ENCOUNTER — Other Ambulatory Visit: Payer: Self-pay | Admitting: *Deleted

## 2019-07-07 DIAGNOSIS — D500 Iron deficiency anemia secondary to blood loss (chronic): Secondary | ICD-10-CM

## 2019-07-07 MED ORDER — FOLIC ACID 800 MCG PO TABS: 1200.0000 ug | ORAL_TABLET | Freq: Every day | ORAL | 2 refills | Status: DC

## 2019-07-07 MED FILL — FOLIC ACID 800 MCG TABS: 800 | 30 days supply | Qty: 45 | Fill #0

## 2019-07-07 NOTE — Telephone Encounter (Signed)
As noted below by Dr. 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 Dr. Marin Olp wants you to start Folic Acid 1021 mcg. I will send the prescription down to [facility]. He verbalized understanding.

## 2019-07-23 ENCOUNTER — Inpatient Hospital Stay (HOSPITAL_BASED_OUTPATIENT_CLINIC_OR_DEPARTMENT_OTHER): Payer: Medicare Other | Admitting: Hematology & Oncology

## 2019-07-23 ENCOUNTER — Inpatient Hospital Stay: Payer: Medicare Other

## 2019-07-23 ENCOUNTER — Other Ambulatory Visit: Payer: Self-pay | Admitting: Hematology & Oncology

## 2019-07-23 ENCOUNTER — Other Ambulatory Visit: Payer: Self-pay

## 2019-07-23 ENCOUNTER — Inpatient Hospital Stay: Payer: Medicare Other | Attending: Hematology & Oncology

## 2019-07-23 ENCOUNTER — Encounter: Payer: Self-pay | Admitting: Hematology & Oncology

## 2019-07-23 ENCOUNTER — Telehealth: Payer: Self-pay | Admitting: Hematology & Oncology

## 2019-07-23 VITALS — BP 115/69 | HR 88 | Temp 98.0°F | Resp 19 | Wt 150.0 lb

## 2019-07-23 DIAGNOSIS — K909 Intestinal malabsorption, unspecified: Secondary | ICD-10-CM | POA: Diagnosis not present

## 2019-07-23 DIAGNOSIS — C7951 Secondary malignant neoplasm of bone: Secondary | ICD-10-CM | POA: Diagnosis not present

## 2019-07-23 DIAGNOSIS — R531 Weakness: Secondary | ICD-10-CM | POA: Insufficient documentation

## 2019-07-23 DIAGNOSIS — Z5111 Encounter for antineoplastic chemotherapy: Secondary | ICD-10-CM | POA: Diagnosis not present

## 2019-07-23 DIAGNOSIS — C61 Malignant neoplasm of prostate: Secondary | ICD-10-CM

## 2019-07-23 DIAGNOSIS — Z79899 Other long term (current) drug therapy: Secondary | ICD-10-CM | POA: Diagnosis not present

## 2019-07-23 DIAGNOSIS — C7A8 Other malignant neuroendocrine tumors: Secondary | ICD-10-CM | POA: Diagnosis not present

## 2019-07-23 DIAGNOSIS — Z885 Allergy status to narcotic agent status: Secondary | ICD-10-CM | POA: Insufficient documentation

## 2019-07-23 DIAGNOSIS — R918 Other nonspecific abnormal finding of lung field: Secondary | ICD-10-CM | POA: Diagnosis not present

## 2019-07-23 DIAGNOSIS — M2550 Pain in unspecified joint: Secondary | ICD-10-CM | POA: Insufficient documentation

## 2019-07-23 DIAGNOSIS — M6250 Muscle wasting and atrophy, not elsewhere classified, unspecified site: Secondary | ICD-10-CM | POA: Insufficient documentation

## 2019-07-23 DIAGNOSIS — D509 Iron deficiency anemia, unspecified: Secondary | ICD-10-CM | POA: Diagnosis not present

## 2019-07-23 LAB — CMP (CANCER CENTER ONLY)
ALT: 18 U/L (ref 0–44)
AST: 19 U/L (ref 15–41)
Albumin: 4.2 g/dL (ref 3.5–5.0)
Alkaline Phosphatase: 55 U/L (ref 38–126)
Anion gap: 7 (ref 5–15)
BUN: 22 mg/dL (ref 8–23)
CO2: 28 mmol/L (ref 22–32)
Calcium: 10.1 mg/dL (ref 8.9–10.3)
Chloride: 101 mmol/L (ref 98–111)
Creatinine: 1.38 mg/dL — ABNORMAL HIGH (ref 0.61–1.24)
GFR, Est AFR Am: 59 mL/min — ABNORMAL LOW (ref >60)
GFR, Estimated: 51 mL/min — ABNORMAL LOW (ref >60)
Glucose, Bld: 130 mg/dL — ABNORMAL HIGH (ref 70–99)
Potassium: 4.2 mmol/L (ref 3.5–5.1)
Sodium: 136 mmol/L (ref 135–145)
Total Bilirubin: 0.3 mg/dL (ref 0.3–1.2)
Total Protein: 6.4 g/dL — ABNORMAL LOW (ref 6.5–8.1)

## 2019-07-23 LAB — CBC WITH DIFFERENTIAL (CANCER CENTER ONLY)
Abs Immature Granulocytes: 0.05 10*3/uL (ref 0.00–0.07)
Basophils Absolute: 0.1 10*3/uL (ref 0.0–0.1)
Basophils Relative: 1 %
Eosinophils Absolute: 0.4 10*3/uL (ref 0.0–0.5)
Eosinophils Relative: 6 %
HCT: 33.6 % — ABNORMAL LOW (ref 39.0–52.0)
Hemoglobin: 10.9 g/dL — ABNORMAL LOW (ref 13.0–17.0)
Immature Granulocytes: 1 %
Lymphocytes Relative: 15 %
Lymphs Abs: 1.1 10*3/uL (ref 0.7–4.0)
MCH: 33.4 pg (ref 26.0–34.0)
MCHC: 32.4 g/dL (ref 30.0–36.0)
MCV: 103.1 fL — ABNORMAL HIGH (ref 80.0–100.0)
Monocytes Absolute: 0.7 10*3/uL (ref 0.1–1.0)
Monocytes Relative: 10 %
Neutro Abs: 4.8 10*3/uL (ref 1.7–7.7)
Neutrophils Relative %: 67 %
Platelet Count: 316 10*3/uL (ref 150–400)
RBC: 3.26 MIL/uL — ABNORMAL LOW (ref 4.22–5.81)
RDW: 13.6 % (ref 11.5–15.5)
WBC Count: 7.1 10*3/uL (ref 4.0–10.5)
nRBC: 0 % (ref 0.0–0.2)

## 2019-07-23 LAB — LACTATE DEHYDROGENASE: LDH: 177 U/L (ref 98–192)

## 2019-07-23 MED ORDER — DENOSUMAB 120 MG/1.7ML ~~LOC~~ SOLN
120.0000 mg | Freq: Once | SUBCUTANEOUS | Status: AC
  Administered 2019-07-23: 120 mg via SUBCUTANEOUS

## 2019-07-23 MED ORDER — SODIUM CHLORIDE 0.9 % IV SOLN
3.2000 mg/m2 | Freq: Once | INTRAVENOUS | Status: AC
  Administered 2019-07-23: 5.8 mg via INTRAVENOUS
  Filled 2019-07-23: qty 11.6

## 2019-07-23 MED ORDER — PALONOSETRON HCL INJECTION 0.25 MG/5ML
INTRAVENOUS | Status: AC
  Filled 2019-07-23: qty 5

## 2019-07-23 MED ORDER — SODIUM CHLORIDE 0.9 % IV SOLN
Freq: Once | INTRAVENOUS | Status: AC
  Filled 2019-07-23: qty 250

## 2019-07-23 MED ORDER — PALONOSETRON HCL INJECTION 0.25 MG/5ML
0.2500 mg | Freq: Once | INTRAVENOUS | Status: AC
  Administered 2019-07-23: 0.25 mg via INTRAVENOUS

## 2019-07-23 MED ORDER — SODIUM CHLORIDE 0.9% FLUSH
10.0000 mL | INTRAVENOUS | Status: DC | PRN
  Administered 2019-07-23: 10 mL
  Filled 2019-07-23: qty 10

## 2019-07-23 MED ORDER — DENOSUMAB 120 MG/1.7ML ~~LOC~~ SOLN
SUBCUTANEOUS | Status: AC
  Filled 2019-07-23: qty 1.7

## 2019-07-23 MED ORDER — SODIUM CHLORIDE 0.9 % IV SOLN
10.0000 mg | Freq: Once | INTRAVENOUS | Status: AC
  Administered 2019-07-23: 10 mg via INTRAVENOUS
  Filled 2019-07-23: qty 10

## 2019-07-23 MED ORDER — HEPARIN SOD (PORK) LOCK FLUSH 100 UNIT/ML IV SOLN
500.0000 [IU] | Freq: Once | INTRAVENOUS | Status: AC | PRN
  Administered 2019-07-23: 500 [IU]
  Filled 2019-07-23: qty 5

## 2019-07-23 MED FILL — PANTOPRAZOLE SOD DR 40 MG T: 40 | 30 days supply | Qty: 30 | Fill #0

## 2019-07-23 MED FILL — SOLIFENACIN SUCCINATE 10 MG: 10 | 30 days supply | Qty: 30 | Fill #3

## 2019-07-23 NOTE — Patient Instructions (Signed)
[facility] Discharge Instructions for Patients Receiving Chemotherapy  Today you received the following chemotherapy agents Lurbinectedin  To help prevent nausea and vomiting after your treatment, we encourage you to take your nausea medication as prescribed by MD.   If you develop nausea and vomiting that is not controlled by your nausea medication, call the clinic.   BELOW ARE SYMPTOMS THAT SHOULD BE REPORTED IMMEDIATELY:  *FEVER GREATER THAN 100.5 F  *CHILLS WITH OR WITHOUT FEVER  NAUSEA AND VOMITING THAT IS NOT CONTROLLED WITH YOUR NAUSEA MEDICATION  *UNUSUAL SHORTNESS OF BREATH  *UNUSUAL BRUISING OR BLEEDING  TENDERNESS IN MOUTH AND THROAT WITH OR WITHOUT PRESENCE OF ULCERS  *URINARY PROBLEMS  *BOWEL PROBLEMS  UNUSUAL RASH Items with * indicate a potential emergency and should be followed up as soon as possible.  Feel free to call the clinic should you have any questions or concerns. The clinic phone number is [phone].  Please show the [facility] at check-in to the Emergency Department and triage nurse.   Lurbinectedin Injection What is this medicine? LURBINECTEDIN (LOOR bin EK te din) is a chemotherapy drug. This medicine is used to treat lung cancer. This medicine may be used for other purposes; ask your health care provider or pharmacist if you have questions. COMMON BRAND NAME(S): ZEPZELCA What should I tell my health care provider before I take this medicine? They need to know if you have any of these conditions:  infection (especially a viral infection such as chickenpox, cold sores, or herpes)  liver disease  low blood counts, like white cells, platelets, or red blood cells  an unusual or allergic reaction to lurbinectedin, other medicines, foods, dyes or preservatives  pregnant or trying to get pregnant  breast-feeding How should I use this medicine? This medicine is for infusion into a vein. It is given by a healthcare  professional in a hospital or clinic setting. Talk to your pediatrician about the use of this medicine in children. Special care may be needed. Overdosage: If you think you have taken too much of this medicine contact a poison control center or emergency room at once. NOTE: This medicine is only for you. Do not share this medicine with others. What if I miss a dose? Keep appointments for follow-up doses. It is important not to miss your dose. Call your doctor or health care professional if you are unable to keep an appointment. What may interact with this medicine?  certain antibiotics like erythromycin or clarithromycin  certain antivirals for HIV or hepatitis  certain medicines for fungal infections like ketoconazole, itraconazole, or posaconazole  certain medicines for seizures like carbamazepine, phenobarbital, phenytoin  grapefruit or grapefruit juice  St. John's Wort This list may not describe all possible interactions. Give your health care provider a list of all the medicines, herbs, non-prescription drugs, or dietary supplements you use. Also tell them if you smoke, drink alcohol, or use illegal drugs. Some items may interact with your medicine. What should I watch for while using this medicine? Your condition will be monitored carefully while you are receiving this medicine. Do not become pregnant while taking this medicine or for 6 months after stopping it. Women should inform their health care professional if they wish to become pregnant or think they might be pregnant. Men should not father a child while taking this medicine and for 4 months after stopping it. There is potential for serious side effects to an unborn child. Talk to your health care  professional for more information. Do not breast-feed a child while taking this medicine or for 2 weeks after stopping it. Call your health care professional for advice if you get a fever, chills, or sore throat, or other symptoms of a  cold or flu. Do not treat yourself. This medicine decreases your body's ability to fight infections. Try to avoid being around people who are sick. Avoid taking medicines that contain aspirin, acetaminophen, ibuprofen, naproxen, or ketoprofen unless instructed by your health care professional. These medicines may hide a fever. Be careful brushing or flossing your teeth or using a toothpick because you may get an infection or bleed more easily. If you have any dental work done, tell your dentist you are receiving this medicine. What side effects may I notice from receiving this medicine? Side effects that you should report to your doctor or health care professional as soon as possible:  allergic reactions like skin rash, itching or hives; swelling of the face, lips, or tongue  chest pain  nausea, vomiting  signs and symptoms of bleeding such as bloody or black, tarry stools; red or dark-brown urine; spitting up blood or brown material that looks like coffee grounds; red spots on the skin; unusual bruising or bleeding from the eyes, gums, or nose  signs and symptoms of infection like fever; chills; cough; sore throat; pain or trouble passing urine  signs and symptoms of liver injury like dark yellow or brown urine; general ill feeling or flu-like symptoms; light-colored stools; loss of appetite; nausea; right upper belly pain; unusually weak or tired; yellowing of the eyes or skin Side effects that usually do not require medical attention (report these to your doctor or health care professional if they continue or are bothersome):  changes in taste  constipation  diarrhea  loss of appetite  muscle pain  pain, tingling, numbness in the hands or feet  signs and symptoms of high blood sugar such as being more thirsty or hungry or having to urinate more than normal. You may also feel very tired or have blurry vision.  signs and symptoms of low magnesium like muscle cramps; muscle pain;  muscle weakness; tremors; seizures; or fast, irregular heartbeat  signs and symptoms of low red blood cells or anemia such as unusually weak or tired; feeling faint or lightheaded; falls; breathing problems  stomach pain This list may not describe all possible side effects. Call your doctor for medical advice about side effects. You may report side effects to FDA at 1-800-FDA-1088. Where should I keep my medicine? This medicine is given in a hospital or clinic and will not be stored at home. NOTE: This sheet is a summary. It may not cover all possible information. If you have questions about this medicine, talk to your doctor, pharmacist, or health care provider.  2020 [employer]/Gold Standard (2018-07-03 13:00:33)

## 2019-07-23 NOTE — Telephone Encounter (Signed)
Appointments scheduled calendar printed per 7/8 los

## 2019-07-23 NOTE — Progress Notes (Signed)
Hematology and Oncology Follow Up Visit  [name] [id number] 1945/03/10 74 y.o. 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 Iron def anemia -- malabsorption   Current Therapy:    Zytiga 1000 mg by mouth daily - discontinued on 08/15/2016  Xtandi 160 mg po q day - start on 08/15/2016 - discontinued  Xgeva 120 mg subcutaneous Q3 months - due in 07/2019  Lupron 22 mg IM every 3 months - due in 07/2019  Radium-223 therapy -- s/p cycle #6  Palliative radiation to the right sacrum  Lutathera - s/p cycle 4/4 --last dose on 07/11/2017  Carbo/VP-16/Tecentriq --  S/p cycle #5  Keytruda q 6 week -- Maintanence -- s/p cycle #3 - changed from 3 to 6 week on 06/30/2018  Taxotere/Keytruda -- start on 09/17/2018 -- s/p cycle #10 -- d/c due to progression  Lurbinectedin --  S/p cycle #4--  started on 04/29/2019  Injectafer 750 mg IV prn     Interim History:  Mr.  [name] is back for follow-up.  He looks quite good.  He did have a very nice July 4 holiday.  He and his wife have been busy.  There new home has been moved into.  He has had no problems with pain.  Is been no issues with hematuria.  He said no cough or shortness of breath.  He has had no fever.  He has the right leg in a leg brace which has been pretty much stable for him.  There is been no problems with his appetite.  He has had no nausea or vomiting.  Overall, his performance status is ECOG 1.       Medications:  Current Outpatient Medications:  .  aspirin 81 MG tablet, Take 81 mg by mouth daily., Disp: , Rfl:  .  Calcium Carbonate-Vitamin D (OYSTER SHELL CALCIUM/D) 250-125 MG-UNIT TABS, Take by mouth daily. , Disp: , Rfl:  .  CALCIUM-IRON-VIT D-VIT K PO, Take 1 tablet by mouth 2 (two) times daily., Disp: , Rfl:  .  dexamethasone (DECADRON) 4 MG tablet, Take 8 mg by mouth 2 (two) times daily. Takes day of  chemo, and 2 days afterwards., Disp: , Rfl:  .  docusate sodium (COLACE) 100 MG capsule, Take 200 mg by mouth 2 (two) times daily., Disp: , Rfl:  .  gabapentin (NEURONTIN) 300 MG capsule, TAKE 1 CAPSULE BY MOUTH TWICE A DAY THEN TAKE 2 CAPSULES AT BEDTIME (Patient taking differently: 2 (two) times daily. ), Disp: 120 capsule, Rfl: 2 .  GENERLAC 10 GM/15ML SOLN, SMARTSIG:45 Milliliter(s) By Mouth Daily PRN, Disp: , Rfl:  .  isosorbide mononitrate (IMDUR) 30 MG 24 hr tablet, Take 1 tablet (30 mg total) by mouth daily., Disp: 90 tablet, Rfl: 2 .  lidocaine-prilocaine (EMLA) cream, , Disp: , Rfl:  .  OVER THE COUNTER MEDICATION, every morning. Juice Plus -- 8 capsule of each the garden, vineyard, and orchard twice a day., Disp: , Rfl:  .  prochlorperazine (COMPAZINE) 10 MG tablet, TAKE 1 TABLET (10 MG TOTAL) BY MOUTH EVERY 6 (SIX) HOURS AS NEEDED (NAUSEA OR VOMITING)., Disp: 30 tablet, Rfl: 1 .  solifenacin (VESICARE) 10 MG tablet, TAKE 1 TABLET BY MOUTH ONCE DAILY, Disp: 30 tablet, Rfl: 5 .  ZETIA 10 MG tablet, Take 10 mg by mouth daily. , Disp: , Rfl:  .  folic acid (FOLVITE) 035 MCG tablet, Take 1.5 tablets (1,200 mcg  total) by mouth daily. (Patient not taking: Reported on 07/23/2019), Disp: 45 tablet, Rfl: 2 .  isosorbide mononitrate (IMDUR) 30 MG 24 hr tablet, TAKE 1 TABLET (30 MG TOTAL) BY MOUTH DAILY., Disp: 90 tablet, Rfl: 1 .  nitroGLYCERIN (NITROSTAT) 0.4 MG SL tablet, Place 1 tablet (0.4 mg total) under the tongue every 5 (five) minutes as needed for chest pain. (Patient not taking: Reported on 06/11/2019), Disp: 25 tablet, Rfl: 11 .  ondansetron (ZOFRAN) 8 MG tablet, Take 1 tablet (8 mg total) by mouth 2 (two) times daily as needed for nausea or vomiting. (Patient not taking: Reported on 05/20/2019), Disp: 20 tablet, Rfl: 0 .  pantoprazole (PROTONIX) 40 MG tablet, TAKE 1 TABLET (40 MG TOTAL) BY MOUTH AT BEDTIME., Disp: 30 tablet, Rfl: 6 .  sucralfate (CARAFATE) 1 GM/10ML suspension, Take 10 mLs (1  g total) by mouth 4 (four) times daily -  with meals and at bedtime. (Patient not taking: Reported on 05/20/2019), Disp: 420 mL, Rfl: 5 No current facility-administered medications for this visit.  Facility-Administered Medications Ordered in Other Visits:  .  octreotide (SANDOSTATIN LAR) IM injection 30 mg, 30 mg, Intramuscular, Once, Danasha Melman, Rudell Cobb, MD  Allergies:  Allergies  Allergen Reactions  . Codeine Nausea And Vomiting  . Hydrocodone Nausea And Vomiting    Past Medical History, Surgical history, Social history, and Family History were reviewed and updated.  Review of Systems: Review of Systems  Constitutional: Negative.   HENT: Negative.   Eyes: Negative.   Respiratory: Negative.   Cardiovascular: Negative.   Gastrointestinal: Negative.   Genitourinary: Negative.  Negative for urgency.  Musculoskeletal: Positive for joint pain.  Skin: Negative.   Neurological: Positive for focal weakness.  Endo/Heme/Allergies: Negative.   Psychiatric/Behavioral: Negative.   All other systems reviewed and are negative.    Physical Exam:  weight is 150 lb (68 kg). His oral temperature is 98 F (36.7 C). His blood pressure is 115/69 and his pulse is 88. His respiration is 19 and oxygen saturation is 100%. PET scan  Physical Exam Vitals reviewed.  HENT:     Head: Normocephalic and atraumatic.  Eyes:     Pupils: Pupils are equal, round, and reactive to light.  Cardiovascular:     Rate and Rhythm: Normal rate and regular rhythm.     Heart sounds: Normal heart sounds.  Pulmonary:     Effort: Pulmonary effort is normal.     Breath sounds: Normal breath sounds.  Abdominal:     General: Bowel sounds are normal.     Palpations: Abdomen is soft.  Musculoskeletal:        General: No tenderness or deformity. Normal range of motion.     Cervical back: Normal range of motion.     Comments: His extremities shows the brace on the right lower leg.  He has some muscle atrophy in the right  lower leg.  Left leg is unremarkable.  He has good range of motion of his joints.  He has good pulses in his distal extremities.  Lymphadenopathy:     Cervical: No cervical adenopathy.  Skin:    General: Skin is warm and dry.     Findings: No erythema or rash.  Neurological:     Mental Status: He is alert and oriented to person, place, and time.  Psychiatric:        Behavior: Behavior normal.        Thought Content: Thought content normal.  Judgment: Judgment normal.     Lab Results  Component Value Date   WBC 7.1 07/23/2019   HGB 10.9 (L) 07/23/2019   HCT 33.6 (L) 07/23/2019   MCV 103.1 (H) 07/23/2019   PLT 316 07/23/2019     Chemistry      Component Value Date/Time   NA 136 07/23/2019 1044   NA 144 12/14/2016 1159   NA 140 10/22/2016 1129   K 4.2 07/23/2019 1044   K 3.5 12/14/2016 1159   K 4.3 10/22/2016 1129   CL 101 07/23/2019 1044   CL 103 12/14/2016 1159   CO2 28 07/23/2019 1044   CO2 30 12/14/2016 1159   CO2 27 10/22/2016 1129   BUN 22 07/23/2019 1044   BUN 16 12/14/2016 1159   BUN 14.0 10/22/2016 1129   CREATININE 1.38 (H) 07/23/2019 1044   CREATININE 1.3 (H) 12/14/2016 1159   CREATININE 1.1 10/22/2016 1129      Component Value Date/Time   CALCIUM 10.1 07/23/2019 1044   CALCIUM 10.0 12/14/2016 1159   CALCIUM 10.4 10/22/2016 1129   ALKPHOS 55 07/23/2019 1044   ALKPHOS 48 12/14/2016 1159   ALKPHOS 60 10/22/2016 1129   AST 19 07/23/2019 1044   AST 16 10/22/2016 1129   ALT 18 07/23/2019 1044   ALT 23 12/14/2016 1159   ALT 14 10/22/2016 1129   BILITOT 0.3 07/23/2019 1044   BILITOT 0.31 10/22/2016 1129      Impression and Plan: 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  As treatment is working, we will now plan for his fourth  cycle of the chemotherapy today.  Hopefully, he will tolerate this well.    I forgot to mention that I got IV iron with his last visit.  His iron saturation  was 20%.  His hemoglobin is better.  I think the IV iron helped him.  We will try to plan for 3 or 4 more cycles of treatment before we do another scan on him.  We will plan to get him back in 3 weeks.     [name], MD 7/8/202112:07 PM  Hematology and Oncology Follow Up Visit  [name] [id number] 1945/05/25 74 y.o. 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 Iron def anemia -- malabsorption   Current Therapy:    Zytiga 1000 mg by mouth daily - discontinued on 08/15/2016  Xtandi 160 mg po q day - start on 08/15/2016 - discontinued  Xgeva 120 mg subcutaneous Q3 months - due in 07/2019  Lupron 22 mg IM every 3 months - due in 07/2019  Radium-223 therapy -- s/p cycle #6  Palliative radiation to the right sacrum  Lutathera - s/p cycle 4/4 --last dose on 07/11/2017  Carbo/VP-16/Tecentriq --  S/p cycle #5  Keytruda q 6 week -- Maintanence -- s/p cycle #3 - changed from 3 to 6 week on 06/30/2018  Taxotere/Keytruda -- start on 09/17/2018 -- s/p cycle #10 -- d/c due to progression  Lurbinectedin --  S/p cycle #3--  started on 04/29/2019  Injectafer 750 mg IV prn     Interim History:  [name] is back for follow-up.  He looks quite good.  He and his wife enjoy their new house.  It is certainly a lot easier for them since it is 1 level.  The great news is that we did a dotatate PET scan on  him.  This was done last week.  It shows that he is responding nicely to treatment.  He has a decrease in the pulmonary nodules.  I am just so happy about this.  He has had no issues with the pain.  Is going to the bathroom okay.  There is no hematuria.  He has had no problems with nausea or vomiting.  For the about 4 days after treatment, he does have a decreased appetite.  He has had no skin rashes.  There is been no headaches.  His blood sugars have been under good  control.  He has had no fever.  He has had no cough or shortness of breath.  He has had no chest wall pain.  Apparently, his family wants him checked for the MTHFR mutation.  Apparently one of their daughters has it.  A granddaughter who lost some twins also has it.  Currently, his performance status is ECOG 1.      Medications:  Current Outpatient Medications:  .  aspirin 81 MG tablet, Take 81 mg by mouth daily., Disp: , Rfl:  .  Calcium Carbonate-Vitamin D (OYSTER SHELL CALCIUM/D) 250-125 MG-UNIT TABS, Take by mouth daily. , Disp: , Rfl:  .  CALCIUM-IRON-VIT D-VIT K PO, Take 1 tablet by mouth 2 (two) times daily., Disp: , Rfl:  .  dexamethasone (DECADRON) 4 MG tablet, Take 8 mg by mouth 2 (two) times daily. Takes day of chemo, and 2 days afterwards., Disp: , Rfl:  .  docusate sodium (COLACE) 100 MG capsule, Take 200 mg by mouth 2 (two) times daily., Disp: , Rfl:  .  gabapentin (NEURONTIN) 300 MG capsule, TAKE 1 CAPSULE BY MOUTH TWICE A DAY THEN TAKE 2 CAPSULES AT BEDTIME (Patient taking differently: 2 (two) times daily. ), Disp: 120 capsule, Rfl: 2 .  GENERLAC 10 GM/15ML SOLN, SMARTSIG:45 Milliliter(s) By Mouth Daily PRN, Disp: , Rfl:  .  isosorbide mononitrate (IMDUR) 30 MG 24 hr tablet, Take 1 tablet (30 mg total) by mouth daily., Disp: 90 tablet, Rfl: 2 .  lidocaine-prilocaine (EMLA) cream, , Disp: , Rfl:  .  OVER THE COUNTER MEDICATION, every morning. Juice Plus -- 8 capsule of each the garden, vineyard, and orchard twice a day., Disp: , Rfl:  .  prochlorperazine (COMPAZINE) 10 MG tablet, TAKE 1 TABLET (10 MG TOTAL) BY MOUTH EVERY 6 (SIX) HOURS AS NEEDED (NAUSEA OR VOMITING)., Disp: 30 tablet, Rfl: 1 .  solifenacin (VESICARE) 10 MG tablet, TAKE 1 TABLET BY MOUTH ONCE DAILY, Disp: 30 tablet, Rfl: 5 .  ZETIA 10 MG tablet, Take 10 mg by mouth daily. , Disp: , Rfl:  .  folic acid (FOLVITE) 277 MCG tablet, Take 1.5 tablets (1,200 mcg total) by mouth daily. (Patient not taking: Reported on  07/23/2019), Disp: 45 tablet, Rfl: 2 .  isosorbide mononitrate (IMDUR) 30 MG 24 hr tablet, TAKE 1 TABLET (30 MG TOTAL) BY MOUTH DAILY., Disp: 90 tablet, Rfl: 1 .  nitroGLYCERIN (NITROSTAT) 0.4 MG SL tablet, Place 1 tablet (0.4 mg total) under the tongue every 5 (five) minutes as needed for chest pain. (Patient not taking: Reported on 06/11/2019), Disp: 25 tablet, Rfl: 11 .  ondansetron (ZOFRAN) 8 MG tablet, Take 1 tablet (8 mg total) by mouth 2 (two) times daily as needed for nausea or vomiting. (Patient not taking: Reported on 05/20/2019), Disp: 20 tablet, Rfl: 0 .  pantoprazole (PROTONIX) 40 MG tablet, TAKE 1 TABLET (40 MG TOTAL) BY MOUTH AT BEDTIME., Disp: 30 tablet,  Rfl: 6 .  sucralfate (CARAFATE) 1 GM/10ML suspension, Take 10 mLs (1 g total) by mouth 4 (four) times daily -  with meals and at bedtime. (Patient not taking: Reported on 05/20/2019), Disp: 420 mL, Rfl: 5 No current facility-administered medications for this visit.  Facility-Administered Medications Ordered in Other Visits:  .  octreotide (SANDOSTATIN LAR) IM injection 30 mg, 30 mg, Intramuscular, Once, Mccrae Speciale, Rudell Cobb, MD  Allergies:  Allergies  Allergen Reactions  . Codeine Nausea And Vomiting  . Hydrocodone Nausea And Vomiting    Past Medical History, Surgical history, Social history, and Family History were reviewed and updated.  Review of Systems: Review of Systems  Constitutional: Negative.   HENT: Negative.   Eyes: Negative.   Respiratory: Negative.   Cardiovascular: Negative.   Gastrointestinal: Negative.   Genitourinary: Negative.  Negative for urgency.  Musculoskeletal: Positive for joint pain.  Skin: Negative.   Neurological: Positive for focal weakness.  Endo/Heme/Allergies: Negative.   Psychiatric/Behavioral: Negative.   All other systems reviewed and are negative.    Physical Exam:  weight is 150 lb (68 kg). His oral temperature is 98 F (36.7 C). His blood pressure is 115/69 and his pulse is 88. His  respiration is 19 and oxygen saturation is 100%. PET scan  Physical Exam Vitals reviewed.  HENT:     Head: Normocephalic and atraumatic.  Eyes:     Pupils: Pupils are equal, round, and reactive to light.  Cardiovascular:     Rate and Rhythm: Normal rate and regular rhythm.     Heart sounds: Normal heart sounds.  Pulmonary:     Effort: Pulmonary effort is normal.     Breath sounds: Normal breath sounds.  Abdominal:     General: Bowel sounds are normal.     Palpations: Abdomen is soft.  Musculoskeletal:        General: No tenderness or deformity. Normal range of motion.     Cervical back: Normal range of motion.     Comments: His extremities shows the brace on the right lower leg.  He has some muscle atrophy in the right lower leg.  Left leg is unremarkable.  He has good range of motion of his joints.  He has good pulses in his distal extremities.  Lymphadenopathy:     Cervical: No cervical adenopathy.  Skin:    General: Skin is warm and dry.     Findings: No erythema or rash.  Neurological:     Mental Status: He is alert and oriented to person, place, and time.  Psychiatric:        Behavior: Behavior normal.        Thought Content: Thought content normal.        Judgment: Judgment normal.     Lab Results  Component Value Date   WBC 7.1 07/23/2019   HGB 10.9 (L) 07/23/2019   HCT 33.6 (L) 07/23/2019   MCV 103.1 (H) 07/23/2019   PLT 316 07/23/2019     Chemistry      Component Value Date/Time   NA 136 07/23/2019 1044   NA 144 12/14/2016 1159   NA 140 10/22/2016 1129   K 4.2 07/23/2019 1044   K 3.5 12/14/2016 1159   K 4.3 10/22/2016 1129   CL 101 07/23/2019 1044   CL 103 12/14/2016 1159   CO2 28 07/23/2019 1044   CO2 30 12/14/2016 1159   CO2 27 10/22/2016 1129   BUN 22 07/23/2019 1044   BUN 16 12/14/2016 1159  BUN 14.0 10/22/2016 1129   CREATININE 1.38 (H) 07/23/2019 1044   CREATININE 1.3 (H) 12/14/2016 1159   CREATININE 1.1 10/22/2016 1129      Component  Value Date/Time   CALCIUM 10.1 07/23/2019 1044   CALCIUM 10.0 12/14/2016 1159   CALCIUM 10.4 10/22/2016 1129   ALKPHOS 55 07/23/2019 1044   ALKPHOS 48 12/14/2016 1159   ALKPHOS 60 10/22/2016 1129   AST 19 07/23/2019 1044   AST 16 10/22/2016 1129   ALT 18 07/23/2019 1044   ALT 23 12/14/2016 1159   ALT 14 10/22/2016 1129   BILITOT 0.3 07/23/2019 1044   BILITOT 0.31 10/22/2016 1129      Impression and Plan: Mr. 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  As treatment is working, we will now plan for his fifth cycle of the chemotherapy today.  Hopefully, he will tolerate this well.    We will try to plan for 2 more cycles of treatment before we do another scan on him.  We will plan to get him back in 3 weeks.     [name], MD 7/8/202111:44 AM

## 2019-07-23 NOTE — Patient Instructions (Signed)

## 2019-08-13 ENCOUNTER — Other Ambulatory Visit: Payer: Self-pay

## 2019-08-13 ENCOUNTER — Inpatient Hospital Stay: Payer: Medicare Other

## 2019-08-13 ENCOUNTER — Inpatient Hospital Stay (HOSPITAL_BASED_OUTPATIENT_CLINIC_OR_DEPARTMENT_OTHER): Payer: Medicare Other | Admitting: Hematology & Oncology

## 2019-08-13 ENCOUNTER — Encounter: Payer: Self-pay | Admitting: Hematology & Oncology

## 2019-08-13 VITALS — BP 116/64 | HR 97 | Temp 98.2°F | Resp 17 | Wt 150.0 lb

## 2019-08-13 DIAGNOSIS — Z5111 Encounter for antineoplastic chemotherapy: Secondary | ICD-10-CM | POA: Diagnosis not present

## 2019-08-13 DIAGNOSIS — K909 Intestinal malabsorption, unspecified: Secondary | ICD-10-CM | POA: Diagnosis not present

## 2019-08-13 DIAGNOSIS — D509 Iron deficiency anemia, unspecified: Secondary | ICD-10-CM | POA: Diagnosis not present

## 2019-08-13 DIAGNOSIS — C7A8 Other malignant neuroendocrine tumors: Secondary | ICD-10-CM

## 2019-08-13 DIAGNOSIS — M2550 Pain in unspecified joint: Secondary | ICD-10-CM | POA: Diagnosis not present

## 2019-08-13 DIAGNOSIS — C7951 Secondary malignant neoplasm of bone: Secondary | ICD-10-CM | POA: Diagnosis not present

## 2019-08-13 DIAGNOSIS — C7B8 Other secondary neuroendocrine tumors: Secondary | ICD-10-CM

## 2019-08-13 DIAGNOSIS — C61 Malignant neoplasm of prostate: Secondary | ICD-10-CM | POA: Diagnosis not present

## 2019-08-13 DIAGNOSIS — Z95828 Presence of other vascular implants and grafts: Secondary | ICD-10-CM

## 2019-08-13 LAB — CBC WITH DIFFERENTIAL (CANCER CENTER ONLY)
Abs Immature Granulocytes: 0.06 10*3/uL (ref 0.00–0.07)
Basophils Absolute: 0 10*3/uL (ref 0.0–0.1)
Basophils Relative: 1 %
Eosinophils Absolute: 0.4 10*3/uL (ref 0.0–0.5)
Eosinophils Relative: 5 %
HCT: 33.3 % — ABNORMAL LOW (ref 39.0–52.0)
Hemoglobin: 10.8 g/dL — ABNORMAL LOW (ref 13.0–17.0)
Immature Granulocytes: 1 %
Lymphocytes Relative: 11 %
Lymphs Abs: 1 10*3/uL (ref 0.7–4.0)
MCH: 33.4 pg (ref 26.0–34.0)
MCHC: 32.4 g/dL (ref 30.0–36.0)
MCV: 103.1 fL — ABNORMAL HIGH (ref 80.0–100.0)
Monocytes Absolute: 0.5 10*3/uL (ref 0.1–1.0)
Monocytes Relative: 5 %
Neutro Abs: 6.8 10*3/uL (ref 1.7–7.7)
Neutrophils Relative %: 77 %
Platelet Count: 319 10*3/uL (ref 150–400)
RBC: 3.23 MIL/uL — ABNORMAL LOW (ref 4.22–5.81)
RDW: 14 % (ref 11.5–15.5)
WBC Count: 8.8 10*3/uL (ref 4.0–10.5)
nRBC: 0 % (ref 0.0–0.2)

## 2019-08-13 LAB — CMP (CANCER CENTER ONLY)
ALT: 11 U/L (ref 0–44)
AST: 13 U/L — ABNORMAL LOW (ref 15–41)
Albumin: 4.3 g/dL (ref 3.5–5.0)
Alkaline Phosphatase: 56 U/L (ref 38–126)
Anion gap: 9 (ref 5–15)
BUN: 26 mg/dL — ABNORMAL HIGH (ref 8–23)
CO2: 26 mmol/L (ref 22–32)
Calcium: 9.8 mg/dL (ref 8.9–10.3)
Chloride: 101 mmol/L (ref 98–111)
Creatinine: 1.34 mg/dL — ABNORMAL HIGH (ref 0.61–1.24)
GFR, Est AFR Am: >60 mL/min (ref >60)
GFR, Estimated: 53 mL/min — ABNORMAL LOW (ref >60)
Glucose, Bld: 140 mg/dL — ABNORMAL HIGH (ref 70–99)
Potassium: 4.3 mmol/L (ref 3.5–5.1)
Sodium: 136 mmol/L (ref 135–145)
Total Bilirubin: 0.3 mg/dL (ref 0.3–1.2)
Total Protein: 6.8 g/dL (ref 6.5–8.1)

## 2019-08-13 LAB — IRON AND TIBC
Iron: 75 ug/dL (ref 42–163)
Saturation Ratios: 30 % (ref 20–55)
TIBC: 248 ug/dL (ref 202–409)
UIBC: 172 ug/dL (ref 117–376)

## 2019-08-13 LAB — FERRITIN: Ferritin: 780 ng/mL — ABNORMAL HIGH (ref 24–336)

## 2019-08-13 LAB — LACTATE DEHYDROGENASE: LDH: 185 U/L (ref 98–192)

## 2019-08-13 MED ORDER — PALONOSETRON HCL INJECTION 0.25 MG/5ML
0.2500 mg | Freq: Once | INTRAVENOUS | Status: AC
  Administered 2019-08-13: 0.25 mg via INTRAVENOUS

## 2019-08-13 MED ORDER — PALONOSETRON HCL INJECTION 0.25 MG/5ML
INTRAVENOUS | Status: AC
  Filled 2019-08-13: qty 5

## 2019-08-13 MED ORDER — LEUPROLIDE ACETATE (3 MONTH) 22.5 MG ~~LOC~~ KIT
22.5000 mg | PACK | Freq: Once | SUBCUTANEOUS | Status: AC
  Administered 2019-08-13: 22.5 mg via SUBCUTANEOUS
  Filled 2019-08-13: qty 22.5

## 2019-08-13 MED ORDER — SODIUM CHLORIDE 0.9% FLUSH
10.0000 mL | INTRAVENOUS | Status: DC | PRN
  Administered 2019-08-13: 10 mL
  Filled 2019-08-13: qty 10

## 2019-08-13 MED ORDER — SODIUM CHLORIDE 0.9% FLUSH
10.0000 mL | Freq: Once | INTRAVENOUS | Status: AC
  Administered 2019-08-13: 10 mL via INTRAVENOUS
  Filled 2019-08-13: qty 10

## 2019-08-13 MED ORDER — SODIUM CHLORIDE 0.9 % IV SOLN
Freq: Once | INTRAVENOUS | Status: AC
  Filled 2019-08-13: qty 250

## 2019-08-13 MED ORDER — SODIUM CHLORIDE 0.9 % IV SOLN
3.2000 mg/m2 | Freq: Once | INTRAVENOUS | Status: AC
  Administered 2019-08-13: 5.8 mg via INTRAVENOUS
  Filled 2019-08-13: qty 11.6

## 2019-08-13 MED ORDER — HEPARIN SOD (PORK) LOCK FLUSH 100 UNIT/ML IV SOLN
500.0000 [IU] | Freq: Once | INTRAVENOUS | Status: AC | PRN
  Administered 2019-08-13: 500 [IU]
  Filled 2019-08-13: qty 5

## 2019-08-13 MED ORDER — SODIUM CHLORIDE 0.9 % IV SOLN
10.0000 mg | Freq: Once | INTRAVENOUS | Status: AC
  Administered 2019-08-13: 10 mg via INTRAVENOUS
  Filled 2019-08-13: qty 1

## 2019-08-13 NOTE — Patient Instructions (Signed)
[name] Discharge Instructions for Patients Receiving Chemotherapy  Today you received the following chemotherapy agents Lurbinectedin  To help prevent nausea and vomiting after your treatment, we encourage you to take your nausea medication as prescribed by MD.   If you develop nausea and vomiting that is not controlled by your nausea medication, call the clinic.   BELOW ARE SYMPTOMS THAT SHOULD BE REPORTED IMMEDIATELY:  *FEVER GREATER THAN 100.5 F  *CHILLS WITH OR WITHOUT FEVER  NAUSEA AND VOMITING THAT IS NOT CONTROLLED WITH YOUR NAUSEA MEDICATION  *UNUSUAL SHORTNESS OF BREATH  *UNUSUAL BRUISING OR BLEEDING  TENDERNESS IN MOUTH AND THROAT WITH OR WITHOUT PRESENCE OF ULCERS  *URINARY PROBLEMS  *BOWEL PROBLEMS  UNUSUAL RASH Items with * indicate a potential emergency and should be followed up as soon as possible.  Feel free to call the clinic should you have any questions or concerns. The clinic phone number is [phone].  Please show the [facility] at check-in to the Emergency Department and triage nurse.   Lurbinectedin Injection What is this medicine? LURBINECTEDIN (LOOR bin EK te din) is a chemotherapy drug. This medicine is used to treat lung cancer. This medicine may be used for other purposes; ask your health care provider or pharmacist if you have questions. COMMON BRAND NAME(S): ZEPZELCA What should I tell my health care provider before I take this medicine? They need to know if you have any of these conditions:  infection (especially a viral infection such as chickenpox, cold sores, or herpes)  liver disease  low blood counts, like white cells, platelets, or red blood cells  an unusual or allergic reaction to lurbinectedin, other medicines, foods, dyes or preservatives  pregnant or trying to get pregnant  breast-feeding How should I use this medicine? This medicine is for infusion into a vein. It is given by a healthcare  professional in a hospital or clinic setting. Talk to your pediatrician about the use of this medicine in children. Special care may be needed. Overdosage: If you think you have taken too much of this medicine contact a poison control center or emergency room at once. NOTE: This medicine is only for you. Do not share this medicine with others. What if I miss a dose? Keep appointments for follow-up doses. It is important not to miss your dose. Call your doctor or health care professional if you are unable to keep an appointment. What may interact with this medicine?  certain antibiotics like erythromycin or clarithromycin  certain antivirals for HIV or hepatitis  certain medicines for fungal infections like ketoconazole, itraconazole, or posaconazole  certain medicines for seizures like carbamazepine, phenobarbital, phenytoin  grapefruit or grapefruit juice  St. John's Wort This list may not describe all possible interactions. Give your health care provider a list of all the medicines, herbs, non-prescription drugs, or dietary supplements you use. Also tell them if you smoke, drink alcohol, or use illegal drugs. Some items may interact with your medicine. What should I watch for while using this medicine? Your condition will be monitored carefully while you are receiving this medicine. Do not become pregnant while taking this medicine or for 6 months after stopping it. Women should inform their health care professional if they wish to become pregnant or think they might be pregnant. Men should not father a child while taking this medicine and for 4 months after stopping it. There is potential for serious side effects to an unborn child. Talk to your health care  professional for more information. Do not breast-feed a child while taking this medicine or for 2 weeks after stopping it. Call your health care professional for advice if you get a fever, chills, or sore throat, or other symptoms of a  cold or flu. Do not treat yourself. This medicine decreases your body's ability to fight infections. Try to avoid being around people who are sick. Avoid taking medicines that contain aspirin, acetaminophen, ibuprofen, naproxen, or ketoprofen unless instructed by your health care professional. These medicines may hide a fever. Be careful brushing or flossing your teeth or using a toothpick because you may get an infection or bleed more easily. If you have any dental work done, tell your dentist you are receiving this medicine. What side effects may I notice from receiving this medicine? Side effects that you should report to your doctor or health care professional as soon as possible:  allergic reactions like skin rash, itching or hives; swelling of the face, lips, or tongue  chest pain  nausea, vomiting  signs and symptoms of bleeding such as bloody or black, tarry stools; red or dark-brown urine; spitting up blood or brown material that looks like coffee grounds; red spots on the skin; unusual bruising or bleeding from the eyes, gums, or nose  signs and symptoms of infection like fever; chills; cough; sore throat; pain or trouble passing urine  signs and symptoms of liver injury like dark yellow or brown urine; general ill feeling or flu-like symptoms; light-colored stools; loss of appetite; nausea; right upper belly pain; unusually weak or tired; yellowing of the eyes or skin Side effects that usually do not require medical attention (report these to your doctor or health care professional if they continue or are bothersome):  changes in taste  constipation  diarrhea  loss of appetite  muscle pain  pain, tingling, numbness in the hands or feet  signs and symptoms of high blood sugar such as being more thirsty or hungry or having to urinate more than normal. You may also feel very tired or have blurry vision.  signs and symptoms of low magnesium like muscle cramps; muscle pain;  muscle weakness; tremors; seizures; or fast, irregular heartbeat  signs and symptoms of low red blood cells or anemia such as unusually weak or tired; feeling faint or lightheaded; falls; breathing problems  stomach pain This list may not describe all possible side effects. Call your doctor for medical advice about side effects. You may report side effects to FDA at 1-800-FDA-1088. Where should I keep my medicine? This medicine is given in a hospital or clinic and will not be stored at home. NOTE: This sheet is a summary. It may not cover all possible information. If you have questions about this medicine, talk to your doctor, pharmacist, or health care provider.  2020 [employer]/Gold Standard (2018-07-03 13:00:33)

## 2019-08-13 NOTE — Progress Notes (Signed)
Hematology and Oncology Follow Up Visit  [name] [id number] 06-Aug-1945 74 y.o. 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 Iron def anemia -- malabsorption   Current Therapy:    Zytiga 1000 mg by mouth daily - discontinued on 08/15/2016  Xtandi 160 mg po q day - start on 08/15/2016 - discontinued  Xgeva 120 mg subcutaneous Q3 months - due in 10/2019  Lupron 22 mg IM every 3 months - due in 10/2019  Radium-223 therapy -- s/p cycle #6  Palliative radiation to the right sacrum  Lutathera - s/p cycle 4/4 --last dose on 07/11/2017  Carbo/VP-16/Tecentriq --  S/p cycle #5  Keytruda q 6 week -- Maintanence -- s/p cycle #3 - changed from 3 to 6 week on 06/30/2018  Taxotere/Keytruda -- start on 09/17/2018 -- s/p cycle #10 -- d/c due to progression  Lurbinectedin --  S/p cycle #5--  started on 04/29/2019  Injectafer 750 mg IV prn     Interim History:  [name] is back for follow-up.  He looks quite good.  Overall, he is doing quite nicely.  He really has no specific complaints.  His appetite is good.  He has had no nausea or vomiting.  There has been no diarrhea.  He has had no cough or shortness of breath.  He is getting around pretty well.  He does have a brace on his right lower leg.  His appetite has been pretty good.  Previous had no cardiac issues.  Overall, his performance status is ECOG 1.       Medications:  Current Outpatient Medications:  .  aspirin 81 MG tablet, Take 81 mg by mouth daily., Disp: , Rfl:  .  Calcium Carbonate-Vitamin D (OYSTER SHELL CALCIUM/D) 250-125 MG-UNIT TABS, Take by mouth daily. , Disp: , Rfl:  .  CALCIUM-IRON-VIT D-VIT K PO, Take 1 tablet by mouth 2 (two) times daily., Disp: , Rfl:  .  dexamethasone (DECADRON) 4 MG tablet, Take 8 mg by mouth 2 (two) times daily. Takes day of chemo, and 2 days afterwards., Disp: , Rfl:  .   docusate sodium (COLACE) 100 MG capsule, Take 200 mg by mouth 2 (two) times daily., Disp: , Rfl:  .  folic acid (FOLVITE) 510 MCG tablet, Take 1.5 tablets (1,200 mcg total) by mouth daily. (Patient not taking: Reported on 07/23/2019), Disp: 45 tablet, Rfl: 2 .  gabapentin (NEURONTIN) 300 MG capsule, TAKE 1 CAPSULE BY MOUTH TWICE A DAY THEN TAKE 2 CAPSULES AT BEDTIME (Patient taking differently: 2 (two) times daily. ), Disp: 120 capsule, Rfl: 2 .  GENERLAC 10 GM/15ML SOLN, SMARTSIG:45 Milliliter(s) By Mouth Daily PRN, Disp: , Rfl:  .  isosorbide mononitrate (IMDUR) 30 MG 24 hr tablet, TAKE 1 TABLET (30 MG TOTAL) BY MOUTH DAILY., Disp: 90 tablet, Rfl: 1 .  isosorbide mononitrate (IMDUR) 30 MG 24 hr tablet, Take 1 tablet (30 mg total) by mouth daily., Disp: 90 tablet, Rfl: 2 .  lidocaine-prilocaine (EMLA) cream, , Disp: , Rfl:  .  nitroGLYCERIN (NITROSTAT) 0.4 MG SL tablet, Place 1 tablet (0.4 mg total) under the tongue every 5 (five) minutes as needed for chest pain. (Patient not taking: Reported on 06/11/2019), Disp: 25 tablet, Rfl: 11 .  ondansetron (ZOFRAN) 8 MG tablet, Take 1 tablet (8 mg total) by mouth 2 (two) times daily as needed for nausea or vomiting. (Patient not taking: Reported on 05/20/2019), Disp: 20  tablet, Rfl: 0 .  OVER THE COUNTER MEDICATION, every morning. Juice Plus -- 8 capsule of each the garden, vineyard, and orchard twice a day., Disp: , Rfl:  .  pantoprazole (PROTONIX) 40 MG tablet, TAKE 1 TABLET (40 MG TOTAL) BY MOUTH AT BEDTIME., Disp: 30 tablet, Rfl: 6 .  prochlorperazine (COMPAZINE) 10 MG tablet, TAKE 1 TABLET (10 MG TOTAL) BY MOUTH EVERY 6 (SIX) HOURS AS NEEDED (NAUSEA OR VOMITING)., Disp: 30 tablet, Rfl: 1 .  solifenacin (VESICARE) 10 MG tablet, TAKE 1 TABLET BY MOUTH ONCE DAILY, Disp: 30 tablet, Rfl: 5 .  sucralfate (CARAFATE) 1 GM/10ML suspension, Take 10 mLs (1 g total) by mouth 4 (four) times daily -  with meals and at bedtime. (Patient not taking: Reported on 05/20/2019),  Disp: 420 mL, Rfl: 5 .  ZETIA 10 MG tablet, Take 10 mg by mouth daily. , Disp: , Rfl:  No current facility-administered medications for this visit.  Facility-Administered Medications Ordered in Other Visits:  .  octreotide (SANDOSTATIN LAR) IM injection 30 mg, 30 mg, Intramuscular, Once, Fey Coghill, Rudell Cobb, MD  Allergies:  Allergies  Allergen Reactions  . Codeine Nausea And Vomiting  . Hydrocodone Nausea And Vomiting    Past Medical History, Surgical history, Social history, and Family History were reviewed and updated.  Review of Systems: Review of Systems  Constitutional: Negative.   HENT: Negative.   Eyes: Negative.   Respiratory: Negative.   Cardiovascular: Negative.   Gastrointestinal: Negative.   Genitourinary: Negative.  Negative for urgency.  Musculoskeletal: Positive for joint pain.  Skin: Negative.   Neurological: Positive for focal weakness.  Endo/Heme/Allergies: Negative.   Psychiatric/Behavioral: Negative.   All other systems reviewed and are negative.    Physical Exam:  weight is 150 lb (68 kg). His oral temperature is 98.2 F (36.8 C). His blood pressure is 116/64 (abnormal) and his pulse is 97. His respiration is 17 and oxygen saturation is 100%. PET scan  Physical Exam Vitals reviewed.  HENT:     Head: Normocephalic and atraumatic.  Eyes:     Pupils: Pupils are equal, round, and reactive to light.  Cardiovascular:     Rate and Rhythm: Normal rate and regular rhythm.     Heart sounds: Normal heart sounds.  Pulmonary:     Effort: Pulmonary effort is normal.     Breath sounds: Normal breath sounds.  Abdominal:     General: Bowel sounds are normal.     Palpations: Abdomen is soft.  Musculoskeletal:        General: No tenderness or deformity. Normal range of motion.     Cervical back: Normal range of motion.     Comments: His extremities shows the brace on the right lower leg.  He has some muscle atrophy in the right lower leg.  Left leg is  unremarkable.  He has good range of motion of his joints.  He has good pulses in his distal extremities.  Lymphadenopathy:     Cervical: No cervical adenopathy.  Skin:    General: Skin is warm and dry.     Findings: No erythema or rash.  Neurological:     Mental Status: He is alert and oriented to person, place, and time.  Psychiatric:        Behavior: Behavior normal.        Thought Content: Thought content normal.        Judgment: Judgment normal.     Lab Results  Component Value Date  WBC 8.8 08/13/2019   HGB 10.8 (L) 08/13/2019   HCT 33.3 (L) 08/13/2019   MCV 103.1 (H) 08/13/2019   PLT 319 08/13/2019     Chemistry      Component Value Date/Time   NA 136 08/13/2019 1150   NA 144 12/14/2016 1159   NA 140 10/22/2016 1129   K 4.3 08/13/2019 1150   K 3.5 12/14/2016 1159   K 4.3 10/22/2016 1129   CL 101 08/13/2019 1150   CL 103 12/14/2016 1159   CO2 26 08/13/2019 1150   CO2 30 12/14/2016 1159   CO2 27 10/22/2016 1129   BUN 26 (H) 08/13/2019 1150   BUN 16 12/14/2016 1159   BUN 14.0 10/22/2016 1129   CREATININE 1.34 (H) 08/13/2019 1150   CREATININE 1.3 (H) 12/14/2016 1159   CREATININE 1.1 10/22/2016 1129      Component Value Date/Time   CALCIUM 9.8 08/13/2019 1150   CALCIUM 10.0 12/14/2016 1159   CALCIUM 10.4 10/22/2016 1129   ALKPHOS 56 08/13/2019 1150   ALKPHOS 48 12/14/2016 1159   ALKPHOS 60 10/22/2016 1129   AST 13 (L) 08/13/2019 1150   AST 16 10/22/2016 1129   ALT 11 08/13/2019 1150   ALT 23 12/14/2016 1159   ALT 14 10/22/2016 1129   BILITOT 0.3 08/13/2019 1150   BILITOT 0.31 10/22/2016 1129      Impression and Plan: 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  As treatment is working, we will now plan for his 6th cycle of the chemotherapy today.  Hopefully, he will tolerate this well.    His last scan was done back in June.  We probably need to wait until after Labor Day until we do our  next one.  He will get his Lupron and Xgeva today.  I will plan to see him back in 3 weeks.     [name], MD 7/29/20211:23 PM

## 2019-08-13 NOTE — Patient Instructions (Signed)

## 2019-08-14 LAB — CHROMOGRANIN A: Chromogranin A (ng/mL): 1312 ng/mL — ABNORMAL HIGH (ref 0.0–101.8)

## 2019-08-18 DIAGNOSIS — Z9181 History of falling: Secondary | ICD-10-CM | POA: Diagnosis not present

## 2019-08-18 DIAGNOSIS — Z0000 Encounter for general adult medical examination without abnormal findings: Secondary | ICD-10-CM | POA: Diagnosis not present

## 2019-08-18 DIAGNOSIS — Z1331 Encounter for screening for depression: Secondary | ICD-10-CM | POA: Diagnosis not present

## 2019-08-18 DIAGNOSIS — Z139 Encounter for screening, unspecified: Secondary | ICD-10-CM | POA: Diagnosis not present

## 2019-08-18 DIAGNOSIS — E785 Hyperlipidemia, unspecified: Secondary | ICD-10-CM | POA: Diagnosis not present

## 2019-08-24 MED FILL — SOLIFENACIN SUCCINATE 10 MG: 10 | 30 days supply | Qty: 30 | Fill #4

## 2019-08-24 MED FILL — PANTOPRAZOLE SOD DR 40 MG T: 40 | 30 days supply | Qty: 30 | Fill #1

## 2019-08-24 MED FILL — EZETIMIBE 10 MG TABS: 10 | 90 days supply | Qty: 90 | Fill #3

## 2019-09-03 ENCOUNTER — Other Ambulatory Visit: Payer: Self-pay

## 2019-09-03 ENCOUNTER — Inpatient Hospital Stay: Payer: Medicare Other

## 2019-09-03 ENCOUNTER — Encounter: Payer: Self-pay | Admitting: Hematology & Oncology

## 2019-09-03 ENCOUNTER — Inpatient Hospital Stay: Payer: Medicare Other | Attending: Hematology & Oncology

## 2019-09-03 ENCOUNTER — Inpatient Hospital Stay (HOSPITAL_BASED_OUTPATIENT_CLINIC_OR_DEPARTMENT_OTHER): Payer: Medicare Other | Admitting: Hematology & Oncology

## 2019-09-03 VITALS — BP 125/68 | HR 95 | Temp 97.9°F | Resp 18 | Wt 152.2 lb

## 2019-09-03 DIAGNOSIS — Z7952 Long term (current) use of systemic steroids: Secondary | ICD-10-CM | POA: Insufficient documentation

## 2019-09-03 DIAGNOSIS — Z885 Allergy status to narcotic agent status: Secondary | ICD-10-CM | POA: Insufficient documentation

## 2019-09-03 DIAGNOSIS — Z5111 Encounter for antineoplastic chemotherapy: Secondary | ICD-10-CM | POA: Diagnosis not present

## 2019-09-03 DIAGNOSIS — C7951 Secondary malignant neoplasm of bone: Secondary | ICD-10-CM

## 2019-09-03 DIAGNOSIS — Z79899 Other long term (current) drug therapy: Secondary | ICD-10-CM | POA: Diagnosis not present

## 2019-09-03 DIAGNOSIS — K909 Intestinal malabsorption, unspecified: Secondary | ICD-10-CM | POA: Insufficient documentation

## 2019-09-03 DIAGNOSIS — M2550 Pain in unspecified joint: Secondary | ICD-10-CM | POA: Insufficient documentation

## 2019-09-03 DIAGNOSIS — C7B8 Other secondary neuroendocrine tumors: Secondary | ICD-10-CM | POA: Diagnosis not present

## 2019-09-03 DIAGNOSIS — C7A8 Other malignant neuroendocrine tumors: Secondary | ICD-10-CM

## 2019-09-03 DIAGNOSIS — C61 Malignant neoplasm of prostate: Secondary | ICD-10-CM | POA: Diagnosis not present

## 2019-09-03 DIAGNOSIS — R531 Weakness: Secondary | ICD-10-CM | POA: Insufficient documentation

## 2019-09-03 DIAGNOSIS — D509 Iron deficiency anemia, unspecified: Secondary | ICD-10-CM | POA: Insufficient documentation

## 2019-09-03 LAB — CBC WITH DIFFERENTIAL (CANCER CENTER ONLY)
Abs Immature Granulocytes: 0.03 10*3/uL (ref 0.00–0.07)
Basophils Absolute: 0.1 10*3/uL (ref 0.0–0.1)
Basophils Relative: 1 %
Eosinophils Absolute: 0.4 10*3/uL (ref 0.0–0.5)
Eosinophils Relative: 5 %
HCT: 30.5 % — ABNORMAL LOW (ref 39.0–52.0)
Hemoglobin: 9.9 g/dL — ABNORMAL LOW (ref 13.0–17.0)
Immature Granulocytes: 0 %
Lymphocytes Relative: 14 %
Lymphs Abs: 1 10*3/uL (ref 0.7–4.0)
MCH: 33.6 pg (ref 26.0–34.0)
MCHC: 32.5 g/dL (ref 30.0–36.0)
MCV: 103.4 fL — ABNORMAL HIGH (ref 80.0–100.0)
Monocytes Absolute: 0.9 10*3/uL (ref 0.1–1.0)
Monocytes Relative: 13 %
Neutro Abs: 4.7 10*3/uL (ref 1.7–7.7)
Neutrophils Relative %: 67 %
Platelet Count: 349 10*3/uL (ref 150–400)
RBC: 2.95 MIL/uL — ABNORMAL LOW (ref 4.22–5.81)
RDW: 14.3 % (ref 11.5–15.5)
WBC Count: 7.1 10*3/uL (ref 4.0–10.5)
nRBC: 0 % (ref 0.0–0.2)

## 2019-09-03 LAB — CMP (CANCER CENTER ONLY)
ALT: 12 U/L (ref 0–44)
AST: 12 U/L — ABNORMAL LOW (ref 15–41)
Albumin: 4.2 g/dL (ref 3.5–5.0)
Alkaline Phosphatase: 50 U/L (ref 38–126)
Anion gap: 7 (ref 5–15)
BUN: 25 mg/dL — ABNORMAL HIGH (ref 8–23)
CO2: 26 mmol/L (ref 22–32)
Calcium: 9.3 mg/dL (ref 8.9–10.3)
Chloride: 102 mmol/L (ref 98–111)
Creatinine: 1.39 mg/dL — ABNORMAL HIGH (ref 0.61–1.24)
GFR, Est AFR Am: 59 mL/min — ABNORMAL LOW (ref >60)
GFR, Estimated: 51 mL/min — ABNORMAL LOW (ref >60)
Glucose, Bld: 125 mg/dL — ABNORMAL HIGH (ref 70–99)
Potassium: 4.2 mmol/L (ref 3.5–5.1)
Sodium: 135 mmol/L (ref 135–145)
Total Bilirubin: 0.3 mg/dL (ref 0.3–1.2)
Total Protein: 6.6 g/dL (ref 6.5–8.1)

## 2019-09-03 LAB — LACTATE DEHYDROGENASE: LDH: 163 U/L (ref 98–192)

## 2019-09-03 MED ORDER — PALONOSETRON HCL INJECTION 0.25 MG/5ML
INTRAVENOUS | Status: AC
  Filled 2019-09-03: qty 5

## 2019-09-03 MED ORDER — SODIUM CHLORIDE 0.9 % IV SOLN
10.0000 mg | Freq: Once | INTRAVENOUS | Status: AC
  Administered 2019-09-03: 10 mg via INTRAVENOUS
  Filled 2019-09-03: qty 10

## 2019-09-03 MED ORDER — SODIUM CHLORIDE 0.9 % IV SOLN
3.2000 mg/m2 | Freq: Once | INTRAVENOUS | Status: AC
  Administered 2019-09-03: 5.8 mg via INTRAVENOUS
  Filled 2019-09-03: qty 11.6

## 2019-09-03 MED ORDER — SODIUM CHLORIDE 0.9 % IV SOLN
Freq: Once | INTRAVENOUS | Status: AC
  Filled 2019-09-03: qty 250

## 2019-09-03 MED ORDER — SODIUM CHLORIDE 0.9% FLUSH
10.0000 mL | INTRAVENOUS | Status: DC | PRN
  Administered 2019-09-03: 10 mL
  Filled 2019-09-03: qty 10

## 2019-09-03 MED ORDER — HEPARIN SOD (PORK) LOCK FLUSH 100 UNIT/ML IV SOLN
500.0000 [IU] | Freq: Once | INTRAVENOUS | Status: AC | PRN
  Administered 2019-09-03: 500 [IU]
  Filled 2019-09-03: qty 5

## 2019-09-03 MED ORDER — PALONOSETRON HCL INJECTION 0.25 MG/5ML
0.2500 mg | Freq: Once | INTRAVENOUS | Status: AC
  Administered 2019-09-03: 0.25 mg via INTRAVENOUS

## 2019-09-03 MED FILL — GABAPENTIN 300 MG CAPSULE: 300 | 30 days supply | Qty: 120 | Fill #2

## 2019-09-03 NOTE — Patient Instructions (Signed)
Implanted Port Insertion, Care After °This sheet gives you information about how to care for yourself after your procedure. Your health care provider may also give you more specific instructions. If you have problems or questions, contact your health care provider. °What can I expect after the procedure? °After the procedure, it is common to have: °· Discomfort at the port insertion site. °· Bruising on the skin over the port. This should improve over 3-4 days. °Follow these instructions at home: °Port care °· After your port is placed, you will get a manufacturer's information card. The card has information about your port. Keep this card with you at all times. °· Take care of the port as told by your health care provider. Ask your health care provider if you or a family member can get training for taking care of the port at home. A home health care nurse may also take care of the port. °· Make sure to remember what type of port you have. °Incision care ° °  ° °· Follow instructions from your health care provider about how to take care of your port insertion site. Make sure you: °? Wash your hands with soap and water before and after you change your bandage (dressing). If soap and water are not available, use hand sanitizer. °? Change your dressing as told by your health care provider. °? Leave stitches (sutures), skin glue, or adhesive strips in place. These skin closures may need to stay in place for 2 weeks or longer. If adhesive strip edges start to loosen and curl up, you may trim the loose edges. Do not remove adhesive strips completely unless your health care provider tells you to do that. °· Check your port insertion site every day for signs of infection. Check for: °? Redness, swelling, or pain. °? Fluid or blood. °? Warmth. °? Pus or a bad smell. °Activity °· Return to your normal activities as told by your health care provider. Ask your health care provider what activities are safe for you. °· Do not  lift anything that is heavier than 10 lb (4.5 kg), or the limit that you are told, until your health care provider says that it is safe. °General instructions °· Take over-the-counter and prescription medicines only as told by your health care provider. °· Do not take baths, swim, or use a hot tub until your health care provider approves. Ask your health care provider if you may take showers. You may only be allowed to take sponge baths. °· Do not drive for 24 hours if you were given a sedative during your procedure. °· Wear a medical alert bracelet in case of an emergency. This will tell any health care providers that you have a port. °· Keep all follow-up visits as told by your health care provider. This is important. °Contact a health care provider if: °· You cannot flush your port with saline as directed, or you cannot draw blood from the port. °· You have a fever or chills. °· You have redness, swelling, or pain around your port insertion site. °· You have fluid or blood coming from your port insertion site. °· Your port insertion site feels warm to the touch. °· You have pus or a bad smell coming from the port insertion site. °Get help right away if: °· You have chest pain or shortness of breath. °· You have bleeding from your port that you cannot control. °Summary °· Take care of the port as told by your health   care provider. Keep the manufacturer's information card with you at all times. °· Change your dressing as told by your health care provider. °· Contact a health care provider if you have a fever or chills or if you have redness, swelling, or pain around your port insertion site. °· Keep all follow-up visits as told by your health care provider. °This information is not intended to replace advice given to you by your health care provider. Make sure you discuss any questions you have with your health care provider. °Document Revised: 07/30/2017 Document Reviewed: 07/30/2017 °Elsevier Patient Education ©  2020 [employer] ° °

## 2019-09-03 NOTE — Progress Notes (Signed)
Hematology and Oncology Follow Up Visit  [name] [id number] [birth year] 74 y.o. 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 Iron def anemia -- malabsorption   Current Therapy:    Zytiga 1000 mg by mouth daily - discontinued on 08/15/2016  Xtandi 160 mg po q day - start on 08/15/2016 - discontinued  Xgeva 120 mg subcutaneous Q3 months - due in 10/2019  Lupron 22 mg IM every 3 months - due in 10/2019  Radium-223 therapy -- s/p cycle #6  Palliative radiation to the right sacrum  Lutathera - s/p cycle 4/4 --last dose on 07/11/2017  Carbo/VP-16/Tecentriq --  S/p cycle #5  Keytruda q 6 week -- Maintanence -- s/p cycle #3 - changed from 3 to 6 week on 06/30/2018  Taxotere/Keytruda -- start on 09/17/2018 -- s/p cycle #10 -- d/c due to progression  Lurbinectedin --  S/p cycle #6 --  started on 04/29/2019  Injectafer 750 mg IV prn     Interim History:  Mr.  Lekas is back for follow-up.  He looks quite good.  Overall, he is doing quite nicely.  He really has no specific complaints.  His appetite is good.  He has had no nausea or vomiting.  There has been no diarrhea.  He has had no cough or shortness of breath.  He is getting around pretty well.  He does have a brace on his right lower leg.  His appetite has been pretty good.    Overall, his performance status is ECOG 1.       Medications:  Current Outpatient Medications:  .  aspirin 81 MG tablet, Take 81 mg by mouth daily., Disp: , Rfl:  .  Calcium Carbonate-Vitamin D (OYSTER SHELL CALCIUM/D) 250-125 MG-UNIT TABS, Take by mouth daily. , Disp: , Rfl:  .  CALCIUM-IRON-VIT D-VIT K PO, Take 1 tablet by mouth 2 (two) times daily., Disp: , Rfl:  .  dexamethasone (DECADRON) 4 MG tablet, Take 8 mg by mouth 2 (two) times daily. Takes day of chemo, and 2 days afterwards., Disp: , Rfl:  .  docusate sodium (COLACE) 100 MG  capsule, Take 200 mg by mouth 2 (two) times daily., Disp: , Rfl:  .  gabapentin (NEURONTIN) 300 MG capsule, TAKE 1 CAPSULE BY MOUTH TWICE A DAY THEN TAKE 2 CAPSULES AT BEDTIME (Patient taking differently: 2 (two) times daily. ), Disp: 120 capsule, Rfl: 2 .  GENERLAC 10 GM/15ML SOLN, SMARTSIG:45 Milliliter(s) By Mouth Daily PRN, Disp: , Rfl:  .  isosorbide mononitrate (IMDUR) 30 MG 24 hr tablet, Take 1 tablet (30 mg total) by mouth daily., Disp: 90 tablet, Rfl: 2 .  lidocaine-prilocaine (EMLA) cream, , Disp: , Rfl:  .  OVER THE COUNTER MEDICATION, every morning. Juice Plus -- 8 capsule of each the garden, vineyard, and orchard twice a day., Disp: , Rfl:  .  pantoprazole (PROTONIX) 40 MG tablet, TAKE 1 TABLET (40 MG TOTAL) BY MOUTH AT BEDTIME., Disp: 30 tablet, Rfl: 6 .  prochlorperazine (COMPAZINE) 10 MG tablet, TAKE 1 TABLET (10 MG TOTAL) BY MOUTH EVERY 6 (SIX) HOURS AS NEEDED (NAUSEA OR VOMITING)., Disp: 30 tablet, Rfl: 1 .  solifenacin (VESICARE) 10 MG tablet, TAKE 1 TABLET BY MOUTH ONCE DAILY, Disp: 30 tablet, Rfl: 5 .  ZETIA 10 MG tablet, Take 10 mg by mouth daily. , Disp: , Rfl:  .  folic acid (FOLVITE) 295 MCG tablet, Take 1.5 tablets (  1,200 mcg total) by mouth daily. (Patient not taking: Reported on 09/03/2019), Disp: 45 tablet, Rfl: 2 .  isosorbide mononitrate (IMDUR) 30 MG 24 hr tablet, TAKE 1 TABLET (30 MG TOTAL) BY MOUTH DAILY., Disp: 90 tablet, Rfl: 1 .  nitroGLYCERIN (NITROSTAT) 0.4 MG SL tablet, Place 1 tablet (0.4 mg total) under the tongue every 5 (five) minutes as needed for chest pain. (Patient not taking: Reported on 06/11/2019), Disp: 25 tablet, Rfl: 11 .  ondansetron (ZOFRAN) 8 MG tablet, Take 1 tablet (8 mg total) by mouth 2 (two) times daily as needed for nausea or vomiting. (Patient not taking: Reported on 05/20/2019), Disp: 20 tablet, Rfl: 0 .  sucralfate (CARAFATE) 1 GM/10ML suspension, Take 10 mLs (1 g total) by mouth 4 (four) times daily -  with meals and at bedtime. (Patient  not taking: Reported on 05/20/2019), Disp: 420 mL, Rfl: 5 No current facility-administered medications for this visit.  Facility-Administered Medications Ordered in Other Visits:  .  octreotide (SANDOSTATIN LAR) IM injection 30 mg, 30 mg, Intramuscular, Once, Vernal Rutan, Rudell Cobb, MD  Allergies:  Allergies  Allergen Reactions  . Codeine Nausea And Vomiting  . Hydrocodone Nausea And Vomiting    Past Medical History, Surgical history, Social history, and Family History were reviewed and updated.  Review of Systems: Review of Systems  Constitutional: Negative.   HENT: Negative.   Eyes: Negative.   Respiratory: Negative.   Cardiovascular: Negative.   Gastrointestinal: Negative.   Genitourinary: Negative.  Negative for urgency.  Musculoskeletal: Positive for joint pain.  Skin: Negative.   Neurological: Positive for focal weakness.  Endo/Heme/Allergies: Negative.   Psychiatric/Behavioral: Negative.   All other systems reviewed and are negative.    Physical Exam:  weight is 152 lb 4 oz (69.1 kg). His oral temperature is 97.9 F (36.6 C). His blood pressure is 125/68 and his pulse is 95. His respiration is 18 and oxygen saturation is 99%. PET scan  Physical Exam Vitals reviewed.  HENT:     Head: Normocephalic and atraumatic.  Eyes:     Pupils: Pupils are equal, round, and reactive to light.  Cardiovascular:     Rate and Rhythm: Normal rate and regular rhythm.     Heart sounds: Normal heart sounds.  Pulmonary:     Effort: Pulmonary effort is normal.     Breath sounds: Normal breath sounds.  Abdominal:     General: Bowel sounds are normal.     Palpations: Abdomen is soft.  Musculoskeletal:        General: No tenderness or deformity. Normal range of motion.     Cervical back: Normal range of motion.     Comments: His extremities shows the brace on the right lower leg.  He has some muscle atrophy in the right lower leg.  Left leg is unremarkable.  He has good range of motion of  his joints.  He has good pulses in his distal extremities.  Lymphadenopathy:     Cervical: No cervical adenopathy.  Skin:    General: Skin is warm and dry.     Findings: No erythema or rash.  Neurological:     Mental Status: He is alert and oriented to person, place, and time.  Psychiatric:        Behavior: Behavior normal.        Thought Content: Thought content normal.        Judgment: Judgment normal.     Lab Results  Component Value Date   WBC 7.1  09/03/2019   HGB 9.9 (L) 09/03/2019   HCT 30.5 (L) 09/03/2019   MCV 103.4 (H) 09/03/2019   PLT 349 09/03/2019     Chemistry      Component Value Date/Time   NA 135 09/03/2019 1058   NA 144 12/14/2016 1159   NA 140 10/22/2016 1129   K 4.2 09/03/2019 1058   K 3.5 12/14/2016 1159   K 4.3 10/22/2016 1129   CL 102 09/03/2019 1058   CL 103 12/14/2016 1159   CO2 26 09/03/2019 1058   CO2 30 12/14/2016 1159   CO2 27 10/22/2016 1129   BUN 25 (H) 09/03/2019 1058   BUN 16 12/14/2016 1159   BUN 14.0 10/22/2016 1129   CREATININE 1.39 (H) 09/03/2019 1058   CREATININE 1.3 (H) 12/14/2016 1159   CREATININE 1.1 10/22/2016 1129      Component Value Date/Time   CALCIUM 9.3 09/03/2019 1058   CALCIUM 10.0 12/14/2016 1159   CALCIUM 10.4 10/22/2016 1129   ALKPHOS 50 09/03/2019 1058   ALKPHOS 48 12/14/2016 1159   ALKPHOS 60 10/22/2016 1129   AST 12 (L) 09/03/2019 1058   AST 16 10/22/2016 1129   ALT 12 09/03/2019 1058   ALT 23 12/14/2016 1159   ALT 14 10/22/2016 1129   BILITOT 0.3 09/03/2019 1058   BILITOT 0.31 10/22/2016 1129      Impression and Plan: Mr. 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  As treatment is working, we will now plan for his 7th cycle of the chemotherapy today.  Hopefully, he will tolerate this well.    His last scan was done back in June.  We probably need to wait until after Labor Day until we do our next one.  I will plan to see him back in 3  weeks.     [name], MD 8/19/202112:33 PM

## 2019-09-04 LAB — IRON AND TIBC
Iron: 83 ug/dL (ref 42–163)
Saturation Ratios: 34 % (ref 20–55)
TIBC: 244 ug/dL (ref 202–409)
UIBC: 160 ug/dL (ref 117–376)

## 2019-09-04 LAB — FERRITIN: Ferritin: 766 ng/mL — ABNORMAL HIGH (ref 24–336)

## 2019-09-04 LAB — TESTOSTERONE: Testosterone: <3 ng/dL — ABNORMAL LOW (ref 264–916)

## 2019-09-24 ENCOUNTER — Inpatient Hospital Stay: Payer: Medicare Other | Attending: Hematology & Oncology

## 2019-09-24 ENCOUNTER — Other Ambulatory Visit: Payer: Self-pay

## 2019-09-24 ENCOUNTER — Inpatient Hospital Stay: Payer: Medicare Other

## 2019-09-24 ENCOUNTER — Inpatient Hospital Stay (HOSPITAL_BASED_OUTPATIENT_CLINIC_OR_DEPARTMENT_OTHER): Payer: Medicare Other | Admitting: Hematology & Oncology

## 2019-09-24 ENCOUNTER — Telehealth: Payer: Self-pay | Admitting: Hematology & Oncology

## 2019-09-24 VITALS — BP 129/68 | HR 97 | Temp 98.2°F | Resp 17 | Wt 151.0 lb

## 2019-09-24 DIAGNOSIS — Z79899 Other long term (current) drug therapy: Secondary | ICD-10-CM | POA: Insufficient documentation

## 2019-09-24 DIAGNOSIS — C7A8 Other malignant neuroendocrine tumors: Secondary | ICD-10-CM | POA: Diagnosis not present

## 2019-09-24 DIAGNOSIS — Z5111 Encounter for antineoplastic chemotherapy: Secondary | ICD-10-CM | POA: Insufficient documentation

## 2019-09-24 DIAGNOSIS — Z885 Allergy status to narcotic agent status: Secondary | ICD-10-CM | POA: Diagnosis not present

## 2019-09-24 DIAGNOSIS — M2550 Pain in unspecified joint: Secondary | ICD-10-CM | POA: Insufficient documentation

## 2019-09-24 DIAGNOSIS — R531 Weakness: Secondary | ICD-10-CM | POA: Diagnosis not present

## 2019-09-24 DIAGNOSIS — C61 Malignant neoplasm of prostate: Secondary | ICD-10-CM | POA: Diagnosis not present

## 2019-09-24 DIAGNOSIS — C7951 Secondary malignant neoplasm of bone: Secondary | ICD-10-CM

## 2019-09-24 DIAGNOSIS — D509 Iron deficiency anemia, unspecified: Secondary | ICD-10-CM | POA: Insufficient documentation

## 2019-09-24 DIAGNOSIS — C7801 Secondary malignant neoplasm of right lung: Secondary | ICD-10-CM | POA: Diagnosis not present

## 2019-09-24 DIAGNOSIS — M6250 Muscle wasting and atrophy, not elsewhere classified, unspecified site: Secondary | ICD-10-CM | POA: Diagnosis not present

## 2019-09-24 DIAGNOSIS — Z7952 Long term (current) use of systemic steroids: Secondary | ICD-10-CM | POA: Insufficient documentation

## 2019-09-24 DIAGNOSIS — K909 Intestinal malabsorption, unspecified: Secondary | ICD-10-CM | POA: Insufficient documentation

## 2019-09-24 LAB — CBC WITH DIFFERENTIAL (CANCER CENTER ONLY)
Abs Immature Granulocytes: 0.04 10*3/uL (ref 0.00–0.07)
Basophils Absolute: 0 10*3/uL (ref 0.0–0.1)
Basophils Relative: 0 %
Eosinophils Absolute: 0.4 10*3/uL (ref 0.0–0.5)
Eosinophils Relative: 5 %
HCT: 30.2 % — ABNORMAL LOW (ref 39.0–52.0)
Hemoglobin: 10 g/dL — ABNORMAL LOW (ref 13.0–17.0)
Immature Granulocytes: 1 %
Lymphocytes Relative: 12 %
Lymphs Abs: 1 10*3/uL (ref 0.7–4.0)
MCH: 34.6 pg — ABNORMAL HIGH (ref 26.0–34.0)
MCHC: 33.1 g/dL (ref 30.0–36.0)
MCV: 104.5 fL — ABNORMAL HIGH (ref 80.0–100.0)
Monocytes Absolute: 0.6 10*3/uL (ref 0.1–1.0)
Monocytes Relative: 8 %
Neutro Abs: 5.9 10*3/uL (ref 1.7–7.7)
Neutrophils Relative %: 74 %
Platelet Count: 362 10*3/uL (ref 150–400)
RBC: 2.89 MIL/uL — ABNORMAL LOW (ref 4.22–5.81)
RDW: 14 % (ref 11.5–15.5)
WBC Count: 7.9 10*3/uL (ref 4.0–10.5)
nRBC: 0 % (ref 0.0–0.2)

## 2019-09-24 LAB — CMP (CANCER CENTER ONLY)
ALT: 15 U/L (ref 0–44)
AST: 15 U/L (ref 15–41)
Albumin: 4.2 g/dL (ref 3.5–5.0)
Alkaline Phosphatase: 58 U/L (ref 38–126)
Anion gap: 9 (ref 5–15)
BUN: 23 mg/dL (ref 8–23)
CO2: 27 mmol/L (ref 22–32)
Calcium: 9.8 mg/dL (ref 8.9–10.3)
Chloride: 101 mmol/L (ref 98–111)
Creatinine: 1.35 mg/dL — ABNORMAL HIGH (ref 0.61–1.24)
GFR, Est AFR Am: >60 mL/min (ref >60)
GFR, Estimated: 52 mL/min — ABNORMAL LOW (ref >60)
Glucose, Bld: 134 mg/dL — ABNORMAL HIGH (ref 70–99)
Potassium: 4.2 mmol/L (ref 3.5–5.1)
Sodium: 137 mmol/L (ref 135–145)
Total Bilirubin: 0.3 mg/dL (ref 0.3–1.2)
Total Protein: 6.7 g/dL (ref 6.5–8.1)

## 2019-09-24 LAB — LACTATE DEHYDROGENASE: LDH: 165 U/L (ref 98–192)

## 2019-09-24 MED ORDER — SODIUM CHLORIDE 0.9 % IV SOLN
10.0000 mg | Freq: Once | INTRAVENOUS | Status: AC
  Administered 2019-09-24: 10 mg via INTRAVENOUS
  Filled 2019-09-24: qty 10

## 2019-09-24 MED ORDER — HEPARIN SOD (PORK) LOCK FLUSH 100 UNIT/ML IV SOLN
500.0000 [IU] | Freq: Once | INTRAVENOUS | Status: AC | PRN
  Administered 2019-09-24: 500 [IU]
  Filled 2019-09-24: qty 5

## 2019-09-24 MED ORDER — SODIUM CHLORIDE 0.9% FLUSH
10.0000 mL | INTRAVENOUS | Status: DC | PRN
  Administered 2019-09-24: 10 mL
  Filled 2019-09-24: qty 10

## 2019-09-24 MED ORDER — SODIUM CHLORIDE 0.9 % IV SOLN
3.2000 mg/m2 | Freq: Once | INTRAVENOUS | Status: AC
  Administered 2019-09-24: 5.8 mg via INTRAVENOUS
  Filled 2019-09-24: qty 11.6

## 2019-09-24 MED ORDER — PALONOSETRON HCL INJECTION 0.25 MG/5ML
0.2500 mg | Freq: Once | INTRAVENOUS | Status: AC
  Administered 2019-09-24: 0.25 mg via INTRAVENOUS

## 2019-09-24 MED ORDER — PALONOSETRON HCL INJECTION 0.25 MG/5ML
INTRAVENOUS | Status: AC
  Filled 2019-09-24: qty 5

## 2019-09-24 MED ORDER — SODIUM CHLORIDE 0.9 % IV SOLN
Freq: Once | INTRAVENOUS | Status: AC
  Filled 2019-09-24: qty 250

## 2019-09-24 MED FILL — ISOSORBIDE MN ER 30 MG TAB: 30 | 90 days supply | Qty: 90 | Fill #1

## 2019-09-24 MED FILL — SOLIFENACIN SUCCINATE 10 MG: 10 | 30 days supply | Qty: 30 | Fill #5

## 2019-09-24 MED FILL — PANTOPRAZOLE SOD DR 40 MG T: 40 | 30 days supply | Qty: 30 | Fill #2

## 2019-09-24 NOTE — Patient Instructions (Signed)
[facility] Discharge Instructions for Patients Receiving Chemotherapy  Today you received the following chemotherapy agents Lurbinectedin  To help prevent nausea and vomiting after your treatment, we encourage you to take your nausea medication as prescribed by MD.   If you develop nausea and vomiting that is not controlled by your nausea medication, call the clinic.   BELOW ARE SYMPTOMS THAT SHOULD BE REPORTED IMMEDIATELY:  *FEVER GREATER THAN 100.5 F  *CHILLS WITH OR WITHOUT FEVER  NAUSEA AND VOMITING THAT IS NOT CONTROLLED WITH YOUR NAUSEA MEDICATION  *UNUSUAL SHORTNESS OF BREATH  *UNUSUAL BRUISING OR BLEEDING  TENDERNESS IN MOUTH AND THROAT WITH OR WITHOUT PRESENCE OF ULCERS  *URINARY PROBLEMS  *BOWEL PROBLEMS  UNUSUAL RASH Items with * indicate a potential emergency and should be followed up as soon as possible.  Feel free to call the clinic should you have any questions or concerns. The clinic phone number is [phone].  Please show the [facility] at check-in to the Emergency Department and triage nurse.

## 2019-09-24 NOTE — Progress Notes (Signed)
Hematology and Oncology Follow Up Visit  [name] [id number] 11/01/1945 74 y.o. 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 Iron def anemia -- malabsorption   Current Therapy:    Zytiga 1000 mg by mouth daily - discontinued on 08/15/2016  Xtandi 160 mg po q day - start on 08/15/2016 - discontinued  Xgeva 120 mg subcutaneous Q3 months - due in 10/2019  Lupron 22 mg IM every 3 months - due in 10/2019  Radium-223 therapy -- s/p cycle #6  Palliative radiation to the right sacrum  Lutathera - s/p cycle 4/4 --last dose on 07/11/2017  Carbo/VP-16/Tecentriq --  S/p cycle #5  Keytruda q 6 week -- Maintanence -- s/p cycle #3 - changed from 3 to 6 week on 06/30/2018  Taxotere/Keytruda -- start on 09/17/2018 -- s/p cycle #10 -- d/c due to progression  Lurbinectedin --  S/p cycle #7 --  started on 04/29/2019  Injectafer 750 mg IV prn     Interim History:  [name] is back for follow-up.  He looks quite good.  So far, he is done quite well with treatment.  He really has had very few side effects.  He had a very nice Labor Day weekend.  He has had no problems with pain.  He does have little bit of constipation after treatment.  He does take lactulose.  For some reason, he was taking 45 cc of lactulose.  He said he would have a "blowout."  I told him that he could probably only take 30 cc of lactulose.  He has had no issues with nausea or vomiting.  He has had no problems with the right leg.    Of note, his last chromogranin A level was actually up to 1300.  He is due for a another scan in about 2 weeks or so.  He has had a decent appetite.  He has had no dysphagia or odynophagia.      Overall, his performance status is ECOG 1.       Medications:  Current Outpatient Medications:  .  aspirin 81 MG tablet, Take 81 mg by mouth daily., Disp: , Rfl:  .  Calcium  Carbonate-Vitamin D (OYSTER SHELL CALCIUM/D) 250-125 MG-UNIT TABS, Take by mouth daily. , Disp: , Rfl:  .  CALCIUM-IRON-VIT D-VIT K PO, Take 1 tablet by mouth 2 (two) times daily., Disp: , Rfl:  .  dexamethasone (DECADRON) 4 MG tablet, Take 8 mg by mouth 2 (two) times daily. Takes day of chemo, and 2 days afterwards., Disp: , Rfl:  .  docusate sodium (COLACE) 100 MG capsule, Take 200 mg by mouth 2 (two) times daily., Disp: , Rfl:  .  gabapentin (NEURONTIN) 300 MG capsule, TAKE 1 CAPSULE BY MOUTH TWICE A DAY THEN TAKE 2 CAPSULES AT BEDTIME (Patient taking differently: 2 (two) times daily. ), Disp: 120 capsule, Rfl: 2 .  GENERLAC 10 GM/15ML SOLN, SMARTSIG:45 Milliliter(s) By Mouth Daily PRN, Disp: , Rfl:  .  lidocaine-prilocaine (EMLA) cream, , Disp: , Rfl:  .  nitroGLYCERIN (NITROSTAT) 0.4 MG SL tablet, Place 1 tablet (0.4 mg total) under the tongue every 5 (five) minutes as needed for chest pain. (Patient not taking: Reported on 06/11/2019), Disp: 25 tablet, Rfl: 11 .  ondansetron (ZOFRAN) 8 MG tablet, Take 1 tablet (8 mg total) by mouth 2 (two) times daily as needed for nausea or vomiting. (Patient not taking: Reported on  05/20/2019), Disp: 20 tablet, Rfl: 0 .  OVER THE COUNTER MEDICATION, every morning. Juice Plus -- 8 capsule of each the garden, vineyard, and orchard twice a day., Disp: , Rfl:  .  pantoprazole (PROTONIX) 40 MG tablet, TAKE 1 TABLET (40 MG TOTAL) BY MOUTH AT BEDTIME., Disp: 30 tablet, Rfl: 6 .  prochlorperazine (COMPAZINE) 10 MG tablet, TAKE 1 TABLET (10 MG TOTAL) BY MOUTH EVERY 6 (SIX) HOURS AS NEEDED (NAUSEA OR VOMITING)., Disp: 30 tablet, Rfl: 1 .  solifenacin (VESICARE) 10 MG tablet, TAKE 1 TABLET BY MOUTH ONCE DAILY, Disp: 30 tablet, Rfl: 5 .  sucralfate (CARAFATE) 1 GM/10ML suspension, Take 10 mLs (1 g total) by mouth 4 (four) times daily -  with meals and at bedtime. (Patient not taking: Reported on 05/20/2019), Disp: 420 mL, Rfl: 5 .  ZETIA 10 MG tablet, Take 10 mg by mouth daily.  , Disp: , Rfl:  No current facility-administered medications for this visit.  Facility-Administered Medications Ordered in Other Visits:  .  octreotide (SANDOSTATIN LAR) IM injection 30 mg, 30 mg, Intramuscular, Once, Farmer Mccahill, Rudell Cobb, MD  Allergies:  Allergies  Allergen Reactions  . Codeine Nausea And Vomiting  . Hydrocodone Nausea And Vomiting    Past Medical History, Surgical history, Social history, and Family History were reviewed and updated.  Review of Systems: Review of Systems  Constitutional: Negative.   HENT: Negative.   Eyes: Negative.   Respiratory: Negative.   Cardiovascular: Negative.   Gastrointestinal: Negative.   Genitourinary: Negative.  Negative for urgency.  Musculoskeletal: Positive for joint pain.  Skin: Negative.   Neurological: Positive for focal weakness.  Endo/Heme/Allergies: Negative.   Psychiatric/Behavioral: Negative.   All other systems reviewed and are negative.    Physical Exam:  weight is 151 lb (68.5 kg). His oral temperature is 98.2 F (36.8 C). His blood pressure is 129/68 and his pulse is 97. His respiration is 17 and oxygen saturation is 100%. PET scan  Physical Exam Vitals reviewed.  HENT:     Head: Normocephalic and atraumatic.  Eyes:     Pupils: Pupils are equal, round, and reactive to light.  Cardiovascular:     Rate and Rhythm: Normal rate and regular rhythm.     Heart sounds: Normal heart sounds.  Pulmonary:     Effort: Pulmonary effort is normal.     Breath sounds: Normal breath sounds.  Abdominal:     General: Bowel sounds are normal.     Palpations: Abdomen is soft.  Musculoskeletal:        General: No tenderness or deformity. Normal range of motion.     Cervical back: Normal range of motion.     Comments: His extremities shows the brace on the right lower leg.  He has some muscle atrophy in the right lower leg.  Left leg is unremarkable.  He has good range of motion of his joints.  He has good pulses in his distal  extremities.  Lymphadenopathy:     Cervical: No cervical adenopathy.  Skin:    General: Skin is warm and dry.     Findings: No erythema or rash.  Neurological:     Mental Status: He is alert and oriented to person, place, and time.  Psychiatric:        Behavior: Behavior normal.        Thought Content: Thought content normal.        Judgment: Judgment normal.     Lab Results  Component Value Date  WBC 7.9 09/24/2019   HGB 10.0 (L) 09/24/2019   HCT 30.2 (L) 09/24/2019   MCV 104.5 (H) 09/24/2019   PLT 362 09/24/2019     Chemistry      Component Value Date/Time   NA 137 09/24/2019 1137   NA 144 12/14/2016 1159   NA 140 10/22/2016 1129   K 4.2 09/24/2019 1137   K 3.5 12/14/2016 1159   K 4.3 10/22/2016 1129   CL 101 09/24/2019 1137   CL 103 12/14/2016 1159   CO2 27 09/24/2019 1137   CO2 30 12/14/2016 1159   CO2 27 10/22/2016 1129   BUN 23 09/24/2019 1137   BUN 16 12/14/2016 1159   BUN 14.0 10/22/2016 1129   CREATININE 1.35 (H) 09/24/2019 1137   CREATININE 1.3 (H) 12/14/2016 1159   CREATININE 1.1 10/22/2016 1129      Component Value Date/Time   CALCIUM 9.8 09/24/2019 1137   CALCIUM 10.0 12/14/2016 1159   CALCIUM 10.4 10/22/2016 1129   ALKPHOS 58 09/24/2019 1137   ALKPHOS 48 12/14/2016 1159   ALKPHOS 60 10/22/2016 1129   AST 15 09/24/2019 1137   AST 16 10/22/2016 1129   ALT 15 09/24/2019 1137   ALT 23 12/14/2016 1159   ALT 14 10/22/2016 1129   BILITOT 0.3 09/24/2019 1137   BILITOT 0.31 10/22/2016 1129      Impression and Plan: Mr. 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  We will plan for 1/8 cycle of chemotherapy today.  After this, we will set him up with another scan.  I am sure that this will show [place] if things are responding still.  Hopefully he is still responding.  If not, we really do not have a lot of other options that we could use.  His quality of life continues to be quite good.  This is  what is important for Mr. Delduca.  We will plan on getting him back in 3 weeks.     [name], MD 9/9/202112:35 PM

## 2019-09-24 NOTE — Telephone Encounter (Signed)
Appointments scheduled  Per 9/9 los

## 2019-09-25 LAB — IRON AND TIBC
Iron: 79 ug/dL (ref 42–163)
Saturation Ratios: 29 % (ref 20–55)
TIBC: 266 ug/dL (ref 202–409)
UIBC: 188 ug/dL (ref 117–376)

## 2019-09-25 LAB — FERRITIN: Ferritin: 732 ng/mL — ABNORMAL HIGH (ref 24–336)

## 2019-09-25 LAB — CHROMOGRANIN A: Chromogranin A (ng/mL): 1102 ng/mL — ABNORMAL HIGH (ref 0.0–101.8)

## 2019-10-14 ENCOUNTER — Other Ambulatory Visit: Payer: Self-pay

## 2019-10-14 ENCOUNTER — Ambulatory Visit (HOSPITAL_COMMUNITY)
Admission: RE | Admit: 2019-10-14 | Discharge: 2019-10-14 | Disposition: A | Discharge status: Home / Self Care | Payer: Medicare Other | Source: Ambulatory Visit | Attending: Hematology & Oncology | Admitting: Hematology & Oncology

## 2019-10-14 DIAGNOSIS — C801 Malignant (primary) neoplasm, unspecified: Secondary | ICD-10-CM | POA: Diagnosis not present

## 2019-10-14 DIAGNOSIS — C7800 Secondary malignant neoplasm of unspecified lung: Secondary | ICD-10-CM | POA: Diagnosis not present

## 2019-10-14 DIAGNOSIS — C7A8 Other malignant neuroendocrine tumors: Secondary | ICD-10-CM | POA: Diagnosis not present

## 2019-10-14 MED ORDER — COPPER CU 64 DOTATATE 1 MCI/ML IV SOLN
4.0000 | Freq: Once | INTRAVENOUS | Status: AC
  Administered 2019-10-14: 4 via INTRAVENOUS

## 2019-10-15 ENCOUNTER — Inpatient Hospital Stay: Payer: Medicare Other

## 2019-10-15 ENCOUNTER — Encounter: Payer: Self-pay | Admitting: Hematology & Oncology

## 2019-10-15 ENCOUNTER — Encounter: Payer: Self-pay | Admitting: *Deleted

## 2019-10-15 ENCOUNTER — Inpatient Hospital Stay (HOSPITAL_BASED_OUTPATIENT_CLINIC_OR_DEPARTMENT_OTHER): Payer: Medicare Other | Admitting: Hematology & Oncology

## 2019-10-15 VITALS — BP 111/65 | HR 97 | Temp 98.2°F | Resp 18 | Wt 150.0 lb

## 2019-10-15 DIAGNOSIS — R531 Weakness: Secondary | ICD-10-CM | POA: Diagnosis not present

## 2019-10-15 DIAGNOSIS — C7A8 Other malignant neuroendocrine tumors: Secondary | ICD-10-CM

## 2019-10-15 DIAGNOSIS — C7951 Secondary malignant neoplasm of bone: Secondary | ICD-10-CM

## 2019-10-15 DIAGNOSIS — C61 Malignant neoplasm of prostate: Secondary | ICD-10-CM | POA: Diagnosis not present

## 2019-10-15 DIAGNOSIS — M6250 Muscle wasting and atrophy, not elsewhere classified, unspecified site: Secondary | ICD-10-CM | POA: Diagnosis not present

## 2019-10-15 DIAGNOSIS — M2550 Pain in unspecified joint: Secondary | ICD-10-CM | POA: Diagnosis not present

## 2019-10-15 DIAGNOSIS — C7801 Secondary malignant neoplasm of right lung: Secondary | ICD-10-CM | POA: Diagnosis not present

## 2019-10-15 DIAGNOSIS — Z5111 Encounter for antineoplastic chemotherapy: Secondary | ICD-10-CM | POA: Diagnosis not present

## 2019-10-15 LAB — CMP (CANCER CENTER ONLY)
ALT: 10 U/L (ref 0–44)
AST: 13 U/L — ABNORMAL LOW (ref 15–41)
Albumin: 4 g/dL (ref 3.5–5.0)
Alkaline Phosphatase: 49 U/L (ref 38–126)
Anion gap: 8 (ref 5–15)
BUN: 23 mg/dL (ref 8–23)
CO2: 26 mmol/L (ref 22–32)
Calcium: 9.2 mg/dL (ref 8.9–10.3)
Chloride: 103 mmol/L (ref 98–111)
Creatinine: 1.41 mg/dL — ABNORMAL HIGH (ref 0.61–1.24)
GFR, Est AFR Am: 58 mL/min — ABNORMAL LOW (ref >60)
GFR, Estimated: 50 mL/min — ABNORMAL LOW (ref >60)
Glucose, Bld: 128 mg/dL — ABNORMAL HIGH (ref 70–99)
Potassium: 4.4 mmol/L (ref 3.5–5.1)
Sodium: 137 mmol/L (ref 135–145)
Total Bilirubin: 0.3 mg/dL (ref 0.3–1.2)
Total Protein: 6.4 g/dL — ABNORMAL LOW (ref 6.5–8.1)

## 2019-10-15 LAB — CBC WITH DIFFERENTIAL (CANCER CENTER ONLY)
Abs Immature Granulocytes: 0.07 10*3/uL (ref 0.00–0.07)
Basophils Absolute: 0.1 10*3/uL (ref 0.0–0.1)
Basophils Relative: 1 %
Eosinophils Absolute: 0.4 10*3/uL (ref 0.0–0.5)
Eosinophils Relative: 4 %
HCT: 29.8 % — ABNORMAL LOW (ref 39.0–52.0)
Hemoglobin: 9.7 g/dL — ABNORMAL LOW (ref 13.0–17.0)
Immature Granulocytes: 1 %
Lymphocytes Relative: 10 %
Lymphs Abs: 0.9 10*3/uL (ref 0.7–4.0)
MCH: 34.9 pg — ABNORMAL HIGH (ref 26.0–34.0)
MCHC: 32.6 g/dL (ref 30.0–36.0)
MCV: 107.2 fL — ABNORMAL HIGH (ref 80.0–100.0)
Monocytes Absolute: 0.9 10*3/uL (ref 0.1–1.0)
Monocytes Relative: 10 %
Neutro Abs: 6.6 10*3/uL (ref 1.7–7.7)
Neutrophils Relative %: 74 %
Platelet Count: 331 10*3/uL (ref 150–400)
RBC: 2.78 MIL/uL — ABNORMAL LOW (ref 4.22–5.81)
RDW: 13.5 % (ref 11.5–15.5)
WBC Count: 8.8 10*3/uL (ref 4.0–10.5)
nRBC: 0 % (ref 0.0–0.2)

## 2019-10-15 LAB — LACTATE DEHYDROGENASE: LDH: 147 U/L (ref 98–192)

## 2019-10-15 MED ORDER — PALONOSETRON HCL INJECTION 0.25 MG/5ML
INTRAVENOUS | Status: AC
  Filled 2019-10-15: qty 5

## 2019-10-15 MED ORDER — SODIUM CHLORIDE 0.9 % IV SOLN
10.0000 mg | Freq: Once | INTRAVENOUS | Status: AC
  Administered 2019-10-15: 10 mg via INTRAVENOUS
  Filled 2019-10-15: qty 10

## 2019-10-15 MED ORDER — SODIUM CHLORIDE 0.9 % IV SOLN
3.2000 mg/m2 | Freq: Once | INTRAVENOUS | Status: AC
  Administered 2019-10-15: 5.8 mg via INTRAVENOUS
  Filled 2019-10-15: qty 11.6

## 2019-10-15 MED ORDER — HEPARIN SOD (PORK) LOCK FLUSH 100 UNIT/ML IV SOLN
500.0000 [IU] | Freq: Once | INTRAVENOUS | Status: AC | PRN
  Administered 2019-10-15: 500 [IU]
  Filled 2019-10-15: qty 5

## 2019-10-15 MED ORDER — SODIUM CHLORIDE 0.9% FLUSH
10.0000 mL | INTRAVENOUS | Status: DC | PRN
  Administered 2019-10-15: 10 mL
  Filled 2019-10-15: qty 10

## 2019-10-15 MED ORDER — PALONOSETRON HCL INJECTION 0.25 MG/5ML
0.2500 mg | Freq: Once | INTRAVENOUS | Status: AC
  Administered 2019-10-15: 0.25 mg via INTRAVENOUS

## 2019-10-15 MED ORDER — SODIUM CHLORIDE 0.9 % IV SOLN
Freq: Once | INTRAVENOUS | Status: AC
  Filled 2019-10-15: qty 250

## 2019-10-15 NOTE — Progress Notes (Signed)
Hematology and Oncology Follow Up Visit  [name] [id number] 1945-05-26 74 y.o. 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 Iron def anemia -- malabsorption   Current Therapy:    Zytiga 1000 mg by mouth daily - discontinued on 08/15/2016  Xtandi 160 mg po q day - start on 08/15/2016 - discontinued  Xgeva 120 mg subcutaneous Q3 months - due in 10/2019  Lupron 22 mg IM every 3 months - due in 10/2019  Radium-223 therapy -- s/p cycle #6  Palliative radiation to the right sacrum  Lutathera - s/p cycle 4/4 --last dose on 07/11/2017  Carbo/VP-16/Tecentriq --  S/p cycle #5  Keytruda q 6 week -- Maintanence -- s/p cycle #3 - changed from 3 to 6 week on 06/30/2018  Taxotere/Keytruda -- start on 09/17/2018 -- s/p cycle #10 -- d/c due to progression  Lurbinectedin --  S/p cycle #8 --  started on 04/29/2019  Injectafer 750 mg IV prn     Interim History:  [name] is back for follow-up.  So far, he seems be doing pretty well.  Everything is relatively stable with his cancer.  We did do a PET scan on him.  This was the new PET scan that they have.  And seems to be more accurate.  I think it uses copper.  The PET scan was done on 10/14/2019.  Everything looked stable.  There is no new disease.  Nothing was growing.  He feels well.  He is eating well.  He is having no problems with bowels or bladder.  He and his wife are going to a friend's house up in [place] this weekend.  He is doing okay with his right leg.  He gets around with a cane.  He actually is quite active.  There has been no problems with cough or shortness of breath.  He has had no headache.  He has had no fever.  He has had no rashes.        Overall, his performance status is ECOG 1.       Medications:  Current Outpatient Medications:  .  Zinc 50 MG CAPS, Take 50 mg by mouth daily., Disp: , Rfl:  .   aspirin 81 MG tablet, Take 81 mg by mouth daily., Disp: , Rfl:  .  Calcium Carbonate-Vitamin D (OYSTER SHELL CALCIUM/D) 250-125 MG-UNIT TABS, Take by mouth daily. , Disp: , Rfl:  .  CALCIUM-IRON-VIT D-VIT K PO, Take 1 tablet by mouth 2 (two) times daily., Disp: , Rfl:  .  dexamethasone (DECADRON) 4 MG tablet, Take 8 mg by mouth 2 (two) times daily. Takes day of chemo, and 2 days afterwards., Disp: , Rfl:  .  docusate sodium (COLACE) 100 MG capsule, Take 200 mg by mouth 2 (two) times daily., Disp: , Rfl:  .  gabapentin (NEURONTIN) 300 MG capsule, TAKE 1 CAPSULE BY MOUTH TWICE A DAY THEN TAKE 2 CAPSULES AT BEDTIME (Patient taking differently: 2 (two) times daily. ), Disp: 120 capsule, Rfl: 2 .  GENERLAC 10 GM/15ML SOLN, SMARTSIG:45 Milliliter(s) By Mouth Daily PRN, Disp: , Rfl:  .  isosorbide mononitrate (IMDUR) 30 MG 24 hr tablet, Take 30 mg by mouth daily., Disp: , Rfl:  .  lidocaine-prilocaine (EMLA) cream, , Disp: , Rfl:  .  nitroGLYCERIN (NITROSTAT) 0.4 MG SL tablet, Place 1 tablet (0.4 mg total) under the tongue every 5 (five) minutes as needed for  chest pain. (Patient not taking: Reported on 06/11/2019), Disp: 25 tablet, Rfl: 11 .  ondansetron (ZOFRAN) 8 MG tablet, Take 1 tablet (8 mg total) by mouth 2 (two) times daily as needed for nausea or vomiting. (Patient not taking: Reported on 05/20/2019), Disp: 20 tablet, Rfl: 0 .  OVER THE COUNTER MEDICATION, every morning. Juice Plus -- 8 capsule of each the garden, vineyard, and orchard twice a day., Disp: , Rfl:  .  pantoprazole (PROTONIX) 40 MG tablet, TAKE 1 TABLET (40 MG TOTAL) BY MOUTH AT BEDTIME., Disp: 30 tablet, Rfl: 6 .  prochlorperazine (COMPAZINE) 10 MG tablet, TAKE 1 TABLET (10 MG TOTAL) BY MOUTH EVERY 6 (SIX) HOURS AS NEEDED (NAUSEA OR VOMITING)., Disp: 30 tablet, Rfl: 1 .  solifenacin (VESICARE) 10 MG tablet, TAKE 1 TABLET BY MOUTH ONCE DAILY, Disp: 30 tablet, Rfl: 5 .  sucralfate (CARAFATE) 1 GM/10ML suspension, Take 10 mLs (1 g total)  by mouth 4 (four) times daily -  with meals and at bedtime. (Patient not taking: Reported on 05/20/2019), Disp: 420 mL, Rfl: 5 .  ZETIA 10 MG tablet, Take 10 mg by mouth daily. , Disp: , Rfl:  No current facility-administered medications for this visit.  Facility-Administered Medications Ordered in Other Visits:  .  octreotide (SANDOSTATIN LAR) IM injection 30 mg, 30 mg, Intramuscular, Once, Halaina Vanduzer, Rudell Cobb, MD  Allergies:  Allergies  Allergen Reactions  . Codeine Nausea And Vomiting  . Hydrocodone Nausea And Vomiting    Past Medical History, Surgical history, Social history, and Family History were reviewed and updated.  Review of Systems: Review of Systems  Constitutional: Negative.   HENT: Negative.   Eyes: Negative.   Respiratory: Negative.   Cardiovascular: Negative.   Gastrointestinal: Negative.   Genitourinary: Negative.  Negative for urgency.  Musculoskeletal: Positive for joint pain.  Skin: Negative.   Neurological: Positive for focal weakness.  Endo/Heme/Allergies: Negative.   Psychiatric/Behavioral: Negative.   All other systems reviewed and are negative.    Physical Exam:  weight is 150 lb (68 kg). His oral temperature is 98.2 F (36.8 C). His blood pressure is 111/65 and his pulse is 97. His respiration is 18 and oxygen saturation is 100%. PET scan  Physical Exam Vitals reviewed.  HENT:     Head: Normocephalic and atraumatic.  Eyes:     Pupils: Pupils are equal, round, and reactive to light.  Cardiovascular:     Rate and Rhythm: Normal rate and regular rhythm.     Heart sounds: Normal heart sounds.  Pulmonary:     Effort: Pulmonary effort is normal.     Breath sounds: Normal breath sounds.  Abdominal:     General: Bowel sounds are normal.     Palpations: Abdomen is soft.  Musculoskeletal:        General: No tenderness or deformity. Normal range of motion.     Cervical back: Normal range of motion.     Comments: His extremities shows the brace on the  right lower leg.  He has some muscle atrophy in the right lower leg.  Left leg is unremarkable.  He has good range of motion of his joints.  He has good pulses in his distal extremities.  Lymphadenopathy:     Cervical: No cervical adenopathy.  Skin:    General: Skin is warm and dry.     Findings: No erythema or rash.  Neurological:     Mental Status: He is alert and oriented to person, place, and time.  Psychiatric:        Behavior: Behavior normal.        Thought Content: Thought content normal.        Judgment: Judgment normal.     Lab Results  Component Value Date   WBC 8.8 10/15/2019   HGB 9.7 (L) 10/15/2019   HCT 29.8 (L) 10/15/2019   MCV 107.2 (H) 10/15/2019   PLT 331 10/15/2019     Chemistry      Component Value Date/Time   NA 137 10/15/2019 1115   NA 144 12/14/2016 1159   NA 140 10/22/2016 1129   K 4.4 10/15/2019 1115   K 3.5 12/14/2016 1159   K 4.3 10/22/2016 1129   CL 103 10/15/2019 1115   CL 103 12/14/2016 1159   CO2 26 10/15/2019 1115   CO2 30 12/14/2016 1159   CO2 27 10/22/2016 1129   BUN 23 10/15/2019 1115   BUN 16 12/14/2016 1159   BUN 14.0 10/22/2016 1129   CREATININE 1.41 (H) 10/15/2019 1115   CREATININE 1.3 (H) 12/14/2016 1159   CREATININE 1.1 10/22/2016 1129      Component Value Date/Time   CALCIUM 9.2 10/15/2019 1115   CALCIUM 10.0 12/14/2016 1159   CALCIUM 10.4 10/22/2016 1129   ALKPHOS 49 10/15/2019 1115   ALKPHOS 48 12/14/2016 1159   ALKPHOS 60 10/22/2016 1129   AST 13 (L) 10/15/2019 1115   AST 16 10/22/2016 1129   ALT 10 10/15/2019 1115   ALT 23 12/14/2016 1159   ALT 14 10/22/2016 1129   BILITOT 0.3 10/15/2019 1115   BILITOT 0.31 10/22/2016 1129      Impression and Plan: [name] is 74 year old gentleman with 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  We will plan for #9 cycle of chemotherapy today.  I will try to get him to the holiday season and then hopefully we can get him through the holiday season  without any treatments.  He really has done nicely.  His disease has been relatively stable.    Of note, his last chromogranin A level was 1100.  This is a little bit better.  This has been trending up and down.       [name], MD 9/30/202112:26 PM

## 2019-10-15 NOTE — Patient Instructions (Signed)
[facility] Discharge Instructions for Patients Receiving Chemotherapy  Today you received the following chemotherapy agents Lurbinectedin  To help prevent nausea and vomiting after your treatment, we encourage you to take your nausea medication as prescribed by MD.   If you develop nausea and vomiting that is not controlled by your nausea medication, call the clinic.   BELOW ARE SYMPTOMS THAT SHOULD BE REPORTED IMMEDIATELY:  *FEVER GREATER THAN 100.5 F  *CHILLS WITH OR WITHOUT FEVER  NAUSEA AND VOMITING THAT IS NOT CONTROLLED WITH YOUR NAUSEA MEDICATION  *UNUSUAL SHORTNESS OF BREATH  *UNUSUAL BRUISING OR BLEEDING  TENDERNESS IN MOUTH AND THROAT WITH OR WITHOUT PRESENCE OF ULCERS  *URINARY PROBLEMS  *BOWEL PROBLEMS  UNUSUAL RASH Items with * indicate a potential emergency and should be followed up as soon as possible.  Feel free to call the clinic should you have any questions or concerns. The clinic phone number is [phone].  Please show the [facility] at check-in to the Emergency Department and triage nurse.

## 2019-10-15 NOTE — Patient Instructions (Signed)

## 2019-10-16 LAB — CHROMOGRANIN A: Chromogranin A (ng/mL): 917.1 ng/mL — ABNORMAL HIGH (ref 0.0–101.8)

## 2019-11-05 ENCOUNTER — Other Ambulatory Visit: Payer: Self-pay | Admitting: Family

## 2019-11-05 ENCOUNTER — Inpatient Hospital Stay: Payer: Medicare Other | Attending: Hematology & Oncology | Admitting: Family

## 2019-11-05 ENCOUNTER — Inpatient Hospital Stay: Payer: Medicare Other

## 2019-11-05 ENCOUNTER — Other Ambulatory Visit: Payer: Self-pay | Admitting: Hematology & Oncology

## 2019-11-05 ENCOUNTER — Encounter: Payer: Self-pay | Admitting: Family

## 2019-11-05 ENCOUNTER — Other Ambulatory Visit: Payer: Self-pay

## 2019-11-05 VITALS — BP 108/65 | HR 99 | Temp 98.5°F | Resp 17 | Wt 147.0 lb

## 2019-11-05 DIAGNOSIS — Z79899 Other long term (current) drug therapy: Secondary | ICD-10-CM | POA: Insufficient documentation

## 2019-11-05 DIAGNOSIS — C7951 Secondary malignant neoplasm of bone: Secondary | ICD-10-CM

## 2019-11-05 DIAGNOSIS — D509 Iron deficiency anemia, unspecified: Secondary | ICD-10-CM | POA: Insufficient documentation

## 2019-11-05 DIAGNOSIS — C7B8 Other secondary neuroendocrine tumors: Secondary | ICD-10-CM

## 2019-11-05 DIAGNOSIS — G63 Polyneuropathy in diseases classified elsewhere: Secondary | ICD-10-CM

## 2019-11-05 DIAGNOSIS — C7B03 Secondary carcinoid tumors of bone: Secondary | ICD-10-CM | POA: Diagnosis not present

## 2019-11-05 DIAGNOSIS — K909 Intestinal malabsorption, unspecified: Secondary | ICD-10-CM | POA: Insufficient documentation

## 2019-11-05 DIAGNOSIS — C7A8 Other malignant neuroendocrine tumors: Secondary | ICD-10-CM

## 2019-11-05 DIAGNOSIS — K5900 Constipation, unspecified: Secondary | ICD-10-CM | POA: Diagnosis not present

## 2019-11-05 DIAGNOSIS — Z5111 Encounter for antineoplastic chemotherapy: Secondary | ICD-10-CM | POA: Diagnosis not present

## 2019-11-05 DIAGNOSIS — C61 Malignant neoplasm of prostate: Secondary | ICD-10-CM

## 2019-11-05 DIAGNOSIS — D500 Iron deficiency anemia secondary to blood loss (chronic): Secondary | ICD-10-CM | POA: Diagnosis not present

## 2019-11-05 DIAGNOSIS — C801 Malignant (primary) neoplasm, unspecified: Secondary | ICD-10-CM

## 2019-11-05 DIAGNOSIS — Z885 Allergy status to narcotic agent status: Secondary | ICD-10-CM | POA: Diagnosis not present

## 2019-11-05 LAB — CBC WITH DIFFERENTIAL (CANCER CENTER ONLY)
Abs Immature Granulocytes: 0.07 10*3/uL (ref 0.00–0.07)
Basophils Absolute: 0 10*3/uL (ref 0.0–0.1)
Basophils Relative: 1 %
Eosinophils Absolute: 0.6 10*3/uL — ABNORMAL HIGH (ref 0.0–0.5)
Eosinophils Relative: 7 %
HCT: 29.8 % — ABNORMAL LOW (ref 39.0–52.0)
Hemoglobin: 9.6 g/dL — ABNORMAL LOW (ref 13.0–17.0)
Immature Granulocytes: 1 %
Lymphocytes Relative: 11 %
Lymphs Abs: 0.8 10*3/uL (ref 0.7–4.0)
MCH: 33.8 pg (ref 26.0–34.0)
MCHC: 32.2 g/dL (ref 30.0–36.0)
MCV: 104.9 fL — ABNORMAL HIGH (ref 80.0–100.0)
Monocytes Absolute: 1.2 10*3/uL — ABNORMAL HIGH (ref 0.1–1.0)
Monocytes Relative: 16 %
Neutro Abs: 5.1 10*3/uL (ref 1.7–7.7)
Neutrophils Relative %: 64 %
Platelet Count: 419 10*3/uL — ABNORMAL HIGH (ref 150–400)
RBC: 2.84 MIL/uL — ABNORMAL LOW (ref 4.22–5.81)
RDW: 13.3 % (ref 11.5–15.5)
WBC Count: 7.9 10*3/uL (ref 4.0–10.5)
nRBC: 0 % (ref 0.0–0.2)

## 2019-11-05 LAB — CMP (CANCER CENTER ONLY)
ALT: 10 U/L (ref 0–44)
AST: 13 U/L — ABNORMAL LOW (ref 15–41)
Albumin: 4 g/dL (ref 3.5–5.0)
Alkaline Phosphatase: 52 U/L (ref 38–126)
Anion gap: 10 (ref 5–15)
BUN: 23 mg/dL (ref 8–23)
CO2: 27 mmol/L (ref 22–32)
Calcium: 9.8 mg/dL (ref 8.9–10.3)
Chloride: 101 mmol/L (ref 98–111)
Creatinine: 1.46 mg/dL — ABNORMAL HIGH (ref 0.61–1.24)
GFR, Estimated: 51 mL/min — ABNORMAL LOW (ref >60)
Glucose, Bld: 109 mg/dL — ABNORMAL HIGH (ref 70–99)
Potassium: 4.1 mmol/L (ref 3.5–5.1)
Sodium: 138 mmol/L (ref 135–145)
Total Bilirubin: 0.3 mg/dL (ref 0.3–1.2)
Total Protein: 7.1 g/dL (ref 6.5–8.1)

## 2019-11-05 LAB — LACTATE DEHYDROGENASE: LDH: 182 U/L (ref 98–192)

## 2019-11-05 MED ORDER — DENOSUMAB 120 MG/1.7ML ~~LOC~~ SOLN
120.0000 mg | Freq: Once | SUBCUTANEOUS | Status: AC
  Administered 2019-11-05: 120 mg via SUBCUTANEOUS

## 2019-11-05 MED ORDER — SODIUM CHLORIDE 0.9% FLUSH
10.0000 mL | INTRAVENOUS | Status: DC | PRN
  Administered 2019-11-05: 10 mL
  Filled 2019-11-05: qty 10

## 2019-11-05 MED ORDER — HEPARIN SOD (PORK) LOCK FLUSH 100 UNIT/ML IV SOLN
500.0000 [IU] | Freq: Once | INTRAVENOUS | Status: AC | PRN
  Administered 2019-11-05: 500 [IU]
  Filled 2019-11-05: qty 5

## 2019-11-05 MED ORDER — PALONOSETRON HCL INJECTION 0.25 MG/5ML
0.2500 mg | Freq: Once | INTRAVENOUS | Status: AC
  Administered 2019-11-05: 0.25 mg via INTRAVENOUS

## 2019-11-05 MED ORDER — PALONOSETRON HCL INJECTION 0.25 MG/5ML
INTRAVENOUS | Status: AC
  Filled 2019-11-05: qty 5

## 2019-11-05 MED ORDER — DENOSUMAB 120 MG/1.7ML ~~LOC~~ SOLN
SUBCUTANEOUS | Status: AC
  Filled 2019-11-05: qty 1.7

## 2019-11-05 MED ORDER — SODIUM CHLORIDE 0.9 % IV SOLN
Freq: Once | INTRAVENOUS | Status: AC
  Filled 2019-11-05: qty 250

## 2019-11-05 MED ORDER — SODIUM CHLORIDE 0.9 % IV SOLN
10.0000 mg | Freq: Once | INTRAVENOUS | Status: AC
  Administered 2019-11-05: 10 mg via INTRAVENOUS
  Filled 2019-11-05: qty 10

## 2019-11-05 MED ORDER — LEUPROLIDE ACETATE (3 MONTH) 22.5 MG ~~LOC~~ KIT
22.5000 mg | PACK | Freq: Once | SUBCUTANEOUS | Status: DC
  Administered 2019-11-05: 22.5 mg via SUBCUTANEOUS
  Filled 2019-11-05: qty 22.5

## 2019-11-05 MED ORDER — SODIUM CHLORIDE 0.9 % IV SOLN
3.2000 mg/m2 | Freq: Once | INTRAVENOUS | Status: AC
  Administered 2019-11-05: 5.8 mg via INTRAVENOUS
  Filled 2019-11-05: qty 11.6

## 2019-11-05 MED FILL — GABAPENTIN 300 MG CAPSULE: 300 | 30 days supply | Qty: 120 | Fill #0

## 2019-11-05 MED FILL — PANTOPRAZOLE SOD DR 40 MG T: 40 | 30 days supply | Qty: 30 | Fill #3

## 2019-11-05 NOTE — Progress Notes (Signed)
Hematology and Oncology Follow Up Visit  [name] [id number] [birth year] 74 y.o. 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 Iron def anemia -- malabsorption  Past Therapy: Zytiga 1000 mg by mouth daily - discontinued on 08/15/2016 Xtandi 160 mg po q day - start on 08/15/2016 - discontinued Radium-223 therapy -- s/p cycle #6 Palliative radiation to the right sacrum Lutathera - s/p cycle 4/4 --last dose on 07/11/2017 Carbo/VP-16/Tecentriq --  S/p cycle #5 Keytruda q 6 week -- Maintanence -- s/p cycle #3 - changed from 3 to 6 week on 06/30/2018 Taxotere/Keytruda -- start on 09/17/2018 -- s/p cycle #10 -- d/c due to progression  Current Therapy:        Lurbinectedin -- started on 04/29/2019, s/p cycle 9 Xgeva 120 mg subcutaneous Q3 months - due in 10/2019 Lupron 22 mg IM every 3 months - due in 10/2019 IV iron as indicated    Interim History:  [name] is here today for follow-up and treatment. He is doing quite well and has no complaints at this time.  Chromogranin level was down to 917 in September.  He is walking quite a but for exercise. He uses his cane when ambulating for added support.  No fever, chills, n/v, cough, rash, dizziness, SOB, chest pain, palpitations, abdominal pain or changes in bowel or bladder habits.  He notes constipation that starts a day or two after each treatment that last 3-4 days. He take Senna plus. He also has diarrhea at times.  No episodes of blood loss. No abnormal bruising, no petechia.  No swelling, tenderness, numbness or tingling in his extremities.  No falls or syncopal episodes to report.   ECOG Performance Status: 1 - Symptomatic but completely ambulatory  Medications:  Allergies as of 11/05/2019      Reactions   Codeine Nausea And Vomiting   Hydrocodone Nausea And Vomiting      Medication List       Accurate as of  November 05, 2019 11:33 AM. If you have any questions, ask your nurse or doctor.        STOP taking these medications   solifenacin 10 MG tablet Commonly known as: VESICARE Stopped by: Laverna Peace, NP     TAKE these medications   aspirin 81 MG tablet Take 81 mg by mouth daily.   CALCIUM-IRON-VIT D-VIT K PO Take 1 tablet by mouth 2 (two) times daily.   dexamethasone 4 MG tablet Commonly known as: DECADRON Take 8 mg by mouth 2 (two) times daily. Takes day of chemo, and 2 days afterwards.   docusate sodium 100 MG capsule Commonly known as: COLACE Take 200 mg by mouth 2 (two) times daily.   gabapentin 300 MG capsule Commonly known as: NEURONTIN TAKE 1 CAPSULE BY MOUTH TWICE A DAY THEN TAKE 2 CAPSULES AT BEDTIME What changed: See the new instructions.   Generlac 10 GM/15ML Soln Generic drug: lactulose (encephalopathy) SMARTSIG:45 Milliliter(s) By Mouth Daily PRN   isosorbide mononitrate 30 MG 24 hr tablet Commonly known as: IMDUR Take 30 mg by mouth daily.   lidocaine-prilocaine cream Commonly known as: EMLA   nitroGLYCERIN 0.4 MG SL tablet Commonly known as: NITROSTAT Place 1 tablet (0.4 mg total) under the tongue every 5 (five) minutes as needed for chest pain.   ondansetron 8 MG tablet Commonly known as: ZOFRAN Take 1 tablet (8 mg total) by mouth 2 (two) times daily as needed for  nausea or vomiting.   OVER THE COUNTER MEDICATION every morning. Juice Plus -- 8 capsule of each the garden, vineyard, and orchard twice a day.   Oyster Shell Calcium/D 250-125 MG-UNIT Tabs Take by mouth daily.   pantoprazole 40 MG tablet Commonly known as: PROTONIX TAKE 1 TABLET (40 MG TOTAL) BY MOUTH AT BEDTIME.   prochlorperazine 10 MG tablet Commonly known as: COMPAZINE TAKE 1 TABLET (10 MG TOTAL) BY MOUTH EVERY 6 (SIX) HOURS AS NEEDED (NAUSEA OR VOMITING).   sucralfate 1 GM/10ML suspension Commonly known as: Carafate Take 10 mLs (1 g total) by mouth 4 (four) times daily  -  with meals and at bedtime.   Zetia 10 MG tablet Generic drug: ezetimibe Take 10 mg by mouth daily.   Zinc 50 MG Caps Take 50 mg by mouth daily.       Allergies:  Allergies  Allergen Reactions  . Codeine Nausea And Vomiting  . Hydrocodone Nausea And Vomiting    Past Medical History, Surgical history, Social history, and Family History were reviewed and updated.  Review of Systems: All other 10 point review of systems is negative.   Physical Exam:  weight is 147 lb (66.7 kg). His oral temperature is 98.5 F (36.9 C). His blood pressure is 108/65 and his pulse is 99. His respiration is 17 and oxygen saturation is 100%.   Wt Readings from Last 3 Encounters:  11/05/19 147 lb (66.7 kg)  10/15/19 150 lb (68 kg)  09/24/19 151 lb (68.5 kg)    Ocular: Sclerae unicteric, pupils equal, round and reactive to light Ear-nose-throat: Oropharynx clear, dentition fair Lymphatic: No cervical or supraclavicular adenopathy Lungs no rales or rhonchi, good excursion bilaterally Heart regular rate and rhythm, no murmur appreciated Abd soft, nontender, positive bowel sounds, no liver or spleen tip palpated on exam, no fluid wave  MSK no focal spinal tenderness, no joint edema Neuro: non-focal, well-oriented, appropriate affect Breasts: Deferred   Lab Results  Component Value Date   WBC 7.9 11/05/2019   HGB 9.6 (L) 11/05/2019   HCT 29.8 (L) 11/05/2019   MCV 104.9 (H) 11/05/2019   PLT 419 (H) 11/05/2019   Lab Results  Component Value Date   FERRITIN 732 (H) 09/24/2019   IRON 79 09/24/2019   TIBC 266 09/24/2019   UIBC 188 09/24/2019   IRONPCTSAT 29 09/24/2019   Lab Results  Component Value Date   RETICCTPCT 1.6 04/08/2019   RBC 2.84 (L) 11/05/2019   No results found for: KPAFRELGTCHN, LAMBDASER, KAPLAMBRATIO No results found for: IGGSERUM, IGA, IGMSERUM No results found for: Odetta Pink, SPEI   Chemistry        Component Value Date/Time   NA 138 11/05/2019 1104   NA 144 12/14/2016 1159   NA 140 10/22/2016 1129   K 4.1 11/05/2019 1104   K 3.5 12/14/2016 1159   K 4.3 10/22/2016 1129   CL 101 11/05/2019 1104   CL 103 12/14/2016 1159   CO2 27 11/05/2019 1104   CO2 30 12/14/2016 1159   CO2 27 10/22/2016 1129   BUN 23 11/05/2019 1104   BUN 16 12/14/2016 1159   BUN 14.0 10/22/2016 1129   CREATININE 1.46 (H) 11/05/2019 1104   CREATININE 1.3 (H) 12/14/2016 1159   CREATININE 1.1 10/22/2016 1129      Component Value Date/Time   CALCIUM 9.8 11/05/2019 1104   CALCIUM 10.0 12/14/2016 1159   CALCIUM 10.4 10/22/2016 1129   ALKPHOS 52 11/05/2019  1104   ALKPHOS 48 12/14/2016 1159   ALKPHOS 60 10/22/2016 1129   AST 13 (L) 11/05/2019 1104   AST 16 10/22/2016 1129   ALT 10 11/05/2019 1104   ALT 23 12/14/2016 1159   ALT 14 10/22/2016 1129   BILITOT 0.3 11/05/2019 1104   BILITOT 0.31 10/22/2016 1129       Impression and Plan: Mr. 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 He continues to do well and his Chromogranin A is coming down nicely.  Iron studies are pending. We will replace if needed.  We will proceed with Lurbinectedin today as planned. He is also due for Lupron and Xgeva today as well.  MD follow-up in 3 weeks. Dr. Marin Olp will determine when to start break for holidays.  He was encouraged to contact our office with any questions or concerns.   Laverna Peace, NP 10/21/202111:33 AM

## 2019-11-05 NOTE — Patient Instructions (Signed)

## 2019-11-05 NOTE — Patient Instructions (Signed)
[facility] Discharge Instructions for Patients Receiving Chemotherapy  Today you received the following chemotherapy agents Lurbinectedin  To help prevent nausea and vomiting after your treatment, we encourage you to take your nausea medication as prescribed by MD.   If you develop nausea and vomiting that is not controlled by your nausea medication, call the clinic.   BELOW ARE SYMPTOMS THAT SHOULD BE REPORTED IMMEDIATELY:  *FEVER GREATER THAN 100.5 F  *CHILLS WITH OR WITHOUT FEVER  NAUSEA AND VOMITING THAT IS NOT CONTROLLED WITH YOUR NAUSEA MEDICATION  *UNUSUAL SHORTNESS OF BREATH  *UNUSUAL BRUISING OR BLEEDING  TENDERNESS IN MOUTH AND THROAT WITH OR WITHOUT PRESENCE OF ULCERS  *URINARY PROBLEMS  *BOWEL PROBLEMS  UNUSUAL RASH Items with * indicate a potential emergency and should be followed up as soon as possible.  Feel free to call the clinic should you have any questions or concerns. The clinic phone number is [phone].  Please show the [facility] at check-in to the Emergency Department and triage nurse.

## 2019-11-06 LAB — IRON AND TIBC
Iron: 44 ug/dL (ref 42–163)
Saturation Ratios: 20 % (ref 20–55)
TIBC: 221 ug/dL (ref 202–409)
UIBC: 178 ug/dL (ref 117–376)

## 2019-11-06 LAB — CHROMOGRANIN A: Chromogranin A (ng/mL): 822.6 ng/mL — ABNORMAL HIGH (ref 0.0–101.8)

## 2019-11-06 LAB — FERRITIN: Ferritin: 991 ng/mL — ABNORMAL HIGH (ref 24–336)

## 2019-11-13 ENCOUNTER — Telehealth: Payer: Self-pay | Admitting: *Deleted

## 2019-11-13 NOTE — Telephone Encounter (Signed)
Call received from patient wanting to know if he needs an iron infusion.  Call placed back to patient and patient notified per order of S. Yale NP that he does not need an iron infusion at this time.  Pt appreciative of call back and has no further questions at this time.

## 2019-11-19 ENCOUNTER — Other Ambulatory Visit: Payer: Self-pay | Admitting: Hematology & Oncology

## 2019-11-19 ENCOUNTER — Telehealth: Payer: Self-pay | Admitting: *Deleted

## 2019-11-19 ENCOUNTER — Other Ambulatory Visit: Payer: Self-pay | Admitting: *Deleted

## 2019-11-19 MED ORDER — AMOXICILLIN 500 MG PO CAPS: 2000.0000 mg | ORAL_CAPSULE | Freq: Once | ORAL | 0 refills | Status: DC

## 2019-11-19 MED FILL — AMOXICILLIN 500 MG CAPSULE: 500 | 1 days supply | Qty: 4 | Fill #0

## 2019-11-19 NOTE — Telephone Encounter (Signed)
Message received from patient stating that he has an appt on Monday, 11/23/19 to have his teeth cleaned and would like to know if Dr. Marin Olp would like for him to take an antibiotic prior to his cleaning.  Dr. Marin Olp notified.  Order received from Dr. Marin Olp for pt to take Amoxicillin 2 grams one hour prior to his dental appt.  Prescription sent to [facility] per pt.'s request.  Pt notified of above and is appreciative of assistance.

## 2019-11-26 ENCOUNTER — Inpatient Hospital Stay: Payer: Medicare Other

## 2019-11-26 ENCOUNTER — Other Ambulatory Visit: Payer: Self-pay | Admitting: Hematology & Oncology

## 2019-11-26 ENCOUNTER — Encounter: Payer: Self-pay | Admitting: Hematology & Oncology

## 2019-11-26 ENCOUNTER — Other Ambulatory Visit: Payer: Self-pay

## 2019-11-26 ENCOUNTER — Inpatient Hospital Stay: Payer: Medicare Other | Attending: Hematology & Oncology | Admitting: Hematology & Oncology

## 2019-11-26 VITALS — BP 123/65 | HR 93 | Temp 98.1°F | Resp 18 | Wt 147.0 lb

## 2019-11-26 DIAGNOSIS — C7A8 Other malignant neuroendocrine tumors: Secondary | ICD-10-CM | POA: Diagnosis not present

## 2019-11-26 DIAGNOSIS — D508 Other iron deficiency anemias: Secondary | ICD-10-CM | POA: Diagnosis not present

## 2019-11-26 DIAGNOSIS — Z885 Allergy status to narcotic agent status: Secondary | ICD-10-CM | POA: Diagnosis not present

## 2019-11-26 DIAGNOSIS — C7B8 Other secondary neuroendocrine tumors: Secondary | ICD-10-CM

## 2019-11-26 DIAGNOSIS — C7951 Secondary malignant neoplasm of bone: Secondary | ICD-10-CM

## 2019-11-26 DIAGNOSIS — Z79899 Other long term (current) drug therapy: Secondary | ICD-10-CM | POA: Insufficient documentation

## 2019-11-26 DIAGNOSIS — K909 Intestinal malabsorption, unspecified: Secondary | ICD-10-CM | POA: Diagnosis not present

## 2019-11-26 DIAGNOSIS — M6250 Muscle wasting and atrophy, not elsewhere classified, unspecified site: Secondary | ICD-10-CM | POA: Insufficient documentation

## 2019-11-26 DIAGNOSIS — C61 Malignant neoplasm of prostate: Secondary | ICD-10-CM | POA: Insufficient documentation

## 2019-11-26 DIAGNOSIS — Z5111 Encounter for antineoplastic chemotherapy: Secondary | ICD-10-CM | POA: Insufficient documentation

## 2019-11-26 DIAGNOSIS — D500 Iron deficiency anemia secondary to blood loss (chronic): Secondary | ICD-10-CM

## 2019-11-26 DIAGNOSIS — Z95828 Presence of other vascular implants and grafts: Secondary | ICD-10-CM

## 2019-11-26 LAB — CBC WITH DIFFERENTIAL (CANCER CENTER ONLY)
Abs Immature Granulocytes: 0.07 10*3/uL (ref 0.00–0.07)
Basophils Absolute: 0.1 10*3/uL (ref 0.0–0.1)
Basophils Relative: 1 %
Eosinophils Absolute: 0.3 10*3/uL (ref 0.0–0.5)
Eosinophils Relative: 4 %
HCT: 28.2 % — ABNORMAL LOW (ref 39.0–52.0)
Hemoglobin: 9 g/dL — ABNORMAL LOW (ref 13.0–17.0)
Immature Granulocytes: 1 %
Lymphocytes Relative: 12 %
Lymphs Abs: 0.9 10*3/uL (ref 0.7–4.0)
MCH: 33.1 pg (ref 26.0–34.0)
MCHC: 31.9 g/dL (ref 30.0–36.0)
MCV: 103.7 fL — ABNORMAL HIGH (ref 80.0–100.0)
Monocytes Absolute: 0.9 10*3/uL (ref 0.1–1.0)
Monocytes Relative: 12 %
Neutro Abs: 5.4 10*3/uL (ref 1.7–7.7)
Neutrophils Relative %: 70 %
Platelet Count: 425 10*3/uL — ABNORMAL HIGH (ref 150–400)
RBC: 2.72 MIL/uL — ABNORMAL LOW (ref 4.22–5.81)
RDW: 14 % (ref 11.5–15.5)
WBC Count: 7.7 10*3/uL (ref 4.0–10.5)
nRBC: 0 % (ref 0.0–0.2)

## 2019-11-26 LAB — LACTATE DEHYDROGENASE: LDH: 169 U/L (ref 98–192)

## 2019-11-26 LAB — CMP (CANCER CENTER ONLY)
ALT: 9 U/L (ref 0–44)
AST: 13 U/L — ABNORMAL LOW (ref 15–41)
Albumin: 3.9 g/dL (ref 3.5–5.0)
Alkaline Phosphatase: 46 U/L (ref 38–126)
Anion gap: 10 (ref 5–15)
BUN: 26 mg/dL — ABNORMAL HIGH (ref 8–23)
CO2: 27 mmol/L (ref 22–32)
Calcium: 9.9 mg/dL (ref 8.9–10.3)
Chloride: 100 mmol/L (ref 98–111)
Creatinine: 1.4 mg/dL — ABNORMAL HIGH (ref 0.61–1.24)
GFR, Estimated: 54 mL/min — ABNORMAL LOW (ref >60)
Glucose, Bld: 129 mg/dL — ABNORMAL HIGH (ref 70–99)
Potassium: 3.8 mmol/L (ref 3.5–5.1)
Sodium: 137 mmol/L (ref 135–145)
Total Bilirubin: 0.3 mg/dL (ref 0.3–1.2)
Total Protein: 6.6 g/dL (ref 6.5–8.1)

## 2019-11-26 MED ORDER — PALONOSETRON HCL INJECTION 0.25 MG/5ML
0.2500 mg | Freq: Once | INTRAVENOUS | Status: AC
  Administered 2019-11-26: 0.25 mg via INTRAVENOUS

## 2019-11-26 MED ORDER — SODIUM CHLORIDE 0.9 % IV SOLN
3.2000 mg/m2 | Freq: Once | INTRAVENOUS | Status: AC
  Administered 2019-11-26: 5.8 mg via INTRAVENOUS
  Filled 2019-11-26: qty 11.6

## 2019-11-26 MED ORDER — SODIUM CHLORIDE 0.9 % IV SOLN
10.0000 mg | Freq: Once | INTRAVENOUS | Status: AC
  Administered 2019-11-26: 10 mg via INTRAVENOUS
  Filled 2019-11-26: qty 10

## 2019-11-26 MED ORDER — SODIUM CHLORIDE 0.9% FLUSH
10.0000 mL | Freq: Once | INTRAVENOUS | Status: AC
  Administered 2019-11-26: 10 mL via INTRAVENOUS
  Filled 2019-11-26: qty 10

## 2019-11-26 MED ORDER — SODIUM CHLORIDE 0.9 % IV SOLN
Freq: Once | INTRAVENOUS | Status: AC
  Filled 2019-11-26: qty 250

## 2019-11-26 MED ORDER — PALONOSETRON HCL INJECTION 0.25 MG/5ML
INTRAVENOUS | Status: AC
  Filled 2019-11-26: qty 5

## 2019-11-26 MED ORDER — HEPARIN SOD (PORK) LOCK FLUSH 100 UNIT/ML IV SOLN
500.0000 [IU] | Freq: Once | INTRAVENOUS | Status: AC | PRN
  Administered 2019-11-26: 500 [IU]
  Filled 2019-11-26: qty 5

## 2019-11-26 MED ORDER — SODIUM CHLORIDE 0.9% FLUSH
10.0000 mL | INTRAVENOUS | Status: DC | PRN
  Administered 2019-11-26: 10 mL
  Filled 2019-11-26: qty 10

## 2019-11-26 MED FILL — DEXAMETHASONE 4 MG TABLET: 4 | 15 days supply | Qty: 60 | Fill #0

## 2019-11-26 NOTE — Progress Notes (Signed)
Hematology and Oncology Follow Up Visit  [name] [id number] September 20, 1945 74 y.o. 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 Iron def anemia -- malabsorption  Past Therapy: Zytiga 1000 mg by mouth daily - discontinued on 08/15/2016 Xtandi 160 mg po q day - start on 08/15/2016 - discontinued Radium-223 therapy -- s/p cycle #6 Palliative radiation to the right sacrum Lutathera - s/p cycle 4/4 --last dose on 07/11/2017 Carbo/VP-16/Tecentriq --  S/p cycle #5 Keytruda q 6 week -- Maintanence -- s/p cycle #3 - changed from 3 to 6 week on 06/30/2018 Taxotere/Keytruda -- start on 09/17/2018 -- s/p cycle #10 -- d/c due to progression  Current Therapy:        Lurbinectedin -- started on 04/29/2019, s/p cycle #10 Xgeva 120 mg subcutaneous Q3 months - due in 01/2020 Lupron 22 mg IM every 3 months - due in 01/2020  IV iron as indicated    Interim History:  Mr. Cajamarca is here today for follow-up and treatment.  He looks pretty good.  He really has had no specific complaints.  He is going to celebrate his wife's 70th birthday tomorrow.  I know this is a big event for them.  He has had no problems with cough or shortness of breath.  He has had no nausea or vomiting.  He has had no diarrhea.  There has been no bleeding.  He has had no issues with the right leg.  He does have a leg brace on.  He gets around with a cane.  His last chromogranin A level in October was 822.  There is been no problems with fever.  He has had no rashes.  He has had no headache.  He will will be due for a another dotatate scan to see how everything is looking with treatment.  Currently, I would say his performance status is ECOG 1.    Medications:  Allergies as of 11/26/2019      Reactions   Codeine Nausea And Vomiting   Hydrocodone Nausea And Vomiting      Medication List       Accurate as of  November 26, 2019 11:41 AM. If you have any questions, ask your nurse or doctor.        amoxicillin 500 MG capsule Commonly known as: AMOXIL SMARTSIG:4 Capsule(s) By Mouth Once   aspirin 81 MG tablet Take 81 mg by mouth daily.   CALCIUM-IRON-VIT D-VIT K PO Take 1 tablet by mouth 2 (two) times daily.   dexamethasone 4 MG tablet Commonly known as: DECADRON TAKE 2 TABLETS (8 MG TOTAL) BY MOUTH 2 (TWO) TIMES DAILY FOR 5 DAYS. START 2 DAYS BEFORE EACH CHEMOTHERAPY. What changed: See the new instructions. Changed by: [name], MD   docusate sodium 100 MG capsule Commonly known as: COLACE Take 200 mg by mouth 2 (two) times daily.   gabapentin 300 MG capsule Commonly known as: NEURONTIN TAKE 1 CAPSULE BY MOUTH TWICE A DAY THEN TAKE 2 CAPSULES AT BEDTIME   Generlac 10 GM/15ML Soln Generic drug: lactulose (encephalopathy) SMARTSIG:45 Milliliter(s) By Mouth Daily PRN   isosorbide mononitrate 30 MG 24 hr tablet Commonly known as: IMDUR Take 30 mg by mouth daily.   lidocaine-prilocaine cream Commonly known as: EMLA   nitroGLYCERIN 0.4 MG SL tablet Commonly known as: NITROSTAT Place 1 tablet (0.4 mg total) under the tongue every 5 (five) minutes as needed for chest pain.  ondansetron 8 MG tablet Commonly known as: ZOFRAN Take 1 tablet (8 mg total) by mouth 2 (two) times daily as needed for nausea or vomiting.   OVER THE COUNTER MEDICATION every morning. Juice Plus -- 8 capsule of each the garden, vineyard, and orchard twice a day.   Oyster Shell Calcium/D 250-125 MG-UNIT Tabs Take by mouth daily.   pantoprazole 40 MG tablet Commonly known as: PROTONIX TAKE 1 TABLET (40 MG TOTAL) BY MOUTH AT BEDTIME.   prochlorperazine 10 MG tablet Commonly known as: COMPAZINE TAKE 1 TABLET (10 MG TOTAL) BY MOUTH EVERY 6 (SIX) HOURS AS NEEDED (NAUSEA OR VOMITING).   sucralfate 1 GM/10ML suspension Commonly known as: Carafate Take 10 mLs (1 g total) by mouth 4 (four) times daily  -  with meals and at bedtime.   Zetia 10 MG tablet Generic drug: ezetimibe Take 10 mg by mouth daily.   Zinc 50 MG Caps Take 50 mg by mouth daily.       Allergies:  Allergies  Allergen Reactions  . Codeine Nausea And Vomiting  . Hydrocodone Nausea And Vomiting    Past Medical History, Surgical history, Social history, and Family History were reviewed and updated.  Review of Systems: Review of Systems  Constitutional: Negative.   HENT: Negative.   Eyes: Negative.   Respiratory: Negative.   Cardiovascular: Negative.   Gastrointestinal: Negative.   Genitourinary: Negative.   Musculoskeletal: Negative.   Skin: Negative.   Neurological: Negative.   Endo/Heme/Allergies: Negative.   Psychiatric/Behavioral: Negative.      Physical Exam:  weight is 147 lb (66.7 kg). His oral temperature is 98.1 F (36.7 C). His blood pressure is 123/65 and his pulse is 93. His respiration is 18 and oxygen saturation is 100%.   Wt Readings from Last 3 Encounters:  11/26/19 147 lb (66.7 kg)  11/05/19 147 lb (66.7 kg)  10/15/19 150 lb (68 kg)    Physical Exam Vitals reviewed.  HENT:     Head: Normocephalic and atraumatic.  Eyes:     Pupils: Pupils are equal, round, and reactive to light.  Cardiovascular:     Rate and Rhythm: Normal rate and regular rhythm.     Heart sounds: Normal heart sounds.  Pulmonary:     Effort: Pulmonary effort is normal.     Breath sounds: Normal breath sounds.  Abdominal:     General: Bowel sounds are normal.     Palpations: Abdomen is soft.  Musculoskeletal:        General: No tenderness or deformity. Normal range of motion.     Cervical back: Normal range of motion.     Comments: Extremities shows the brace on the right lower leg.  There is a little bit of muscle atrophy.  He has decent strength in both legs.  He has decent range of motion of his joints.  Pulses are intact in the lower extremities.  Lymphadenopathy:     Cervical: No cervical  adenopathy.  Skin:    General: Skin is warm and dry.     Findings: No erythema or rash.  Neurological:     Mental Status: He is alert and oriented to person, place, and time.  Psychiatric:        Behavior: Behavior normal.        Thought Content: Thought content normal.        Judgment: Judgment normal.      Lab Results  Component Value Date   WBC 7.7 11/26/2019   HGB  9.0 (L) 11/26/2019   HCT 28.2 (L) 11/26/2019   MCV 103.7 (H) 11/26/2019   PLT 425 (H) 11/26/2019   Lab Results  Component Value Date   FERRITIN 991 (H) 11/05/2019   IRON 44 11/05/2019   TIBC 221 11/05/2019   UIBC 178 11/05/2019   IRONPCTSAT 20 11/05/2019   Lab Results  Component Value Date   RETICCTPCT 1.6 04/08/2019   RBC 2.72 (L) 11/26/2019   No results found for: KPAFRELGTCHN, LAMBDASER, KAPLAMBRATIO No results found for: IGGSERUM, IGA, IGMSERUM No results found for: Odetta Pink, SPEI   Chemistry      Component Value Date/Time   NA 137 11/26/2019 1024   NA 144 12/14/2016 1159   NA 140 10/22/2016 1129   K 3.8 11/26/2019 1024   K 3.5 12/14/2016 1159   K 4.3 10/22/2016 1129   CL 100 11/26/2019 1024   CL 103 12/14/2016 1159   CO2 27 11/26/2019 1024   CO2 30 12/14/2016 1159   CO2 27 10/22/2016 1129   BUN 26 (H) 11/26/2019 1024   BUN 16 12/14/2016 1159   BUN 14.0 10/22/2016 1129   CREATININE 1.40 (H) 11/26/2019 1024   CREATININE 1.3 (H) 12/14/2016 1159   CREATININE 1.1 10/22/2016 1129      Component Value Date/Time   CALCIUM 9.9 11/26/2019 1024   CALCIUM 10.0 12/14/2016 1159   CALCIUM 10.4 10/22/2016 1129   ALKPHOS 46 11/26/2019 1024   ALKPHOS 48 12/14/2016 1159   ALKPHOS 60 10/22/2016 1129   AST 13 (L) 11/26/2019 1024   AST 16 10/22/2016 1129   ALT 9 11/26/2019 1024   ALT 23 12/14/2016 1159   ALT 14 10/22/2016 1129   BILITOT 0.3 11/26/2019 1024   BILITOT 0.31 10/22/2016 1129       Impression and Plan: Mr. Riddle is 74 yo  caucasian gentleman with metastatic neuroendocrine carcinoma of unknown   primary.  I am glad to see that the chromogranin A level has come down.  Hopefully this will equate to a response on radiographic studies.  We will go ahead with his 11th cycle of treatment with the Lurbinectedin.  I will plan for the nuclear medicine scan to be done in a couple of weeks.  [name], MD 11/11/202111:41 AM

## 2019-11-26 NOTE — Patient Instructions (Signed)
aloxi Lurbinectedin Injection What is this medicine? LURBINECTEDIN (LOOR bin EK te din) is a chemotherapy drug. This medicine is used to treat lung cancer. This medicine may be used for other purposes; ask your health care provider or pharmacist if you have questions. COMMON BRAND NAME(S): ZEPZELCA What should I tell my health care provider before I take this medicine? They need to know if you have any of these conditions:  infection (especially a viral infection such as chickenpox, cold sores, or herpes)  liver disease  low blood counts, like white cells, platelets, or red blood cells  an unusual or allergic reaction to lurbinectedin, other medicines, foods, dyes or preservatives  pregnant or trying to get pregnant  breast-feeding How should I use this medicine? This medicine is for infusion into a vein. It is given by a healthcare professional in a hospital or clinic setting. Talk to your pediatrician about the use of this medicine in children. Special care may be needed. Overdosage: If you think you have taken too much of this medicine contact a poison control center or emergency room at once. NOTE: This medicine is only for you. Do not share this medicine with others. What if I miss a dose? Keep appointments for follow-up doses. It is important not to miss your dose. Call your doctor or health care professional if you are unable to keep an appointment. What may interact with this medicine?  certain antibiotics like erythromycin or clarithromycin  certain antivirals for HIV or hepatitis  certain medicines for fungal infections like ketoconazole, itraconazole, or posaconazole  certain medicines for seizures like carbamazepine, phenobarbital, phenytoin  grapefruit or grapefruit juice  St. John's Wort This list may not describe all possible interactions. Give your health care provider a list of all the medicines, herbs, non-prescription drugs, or dietary supplements you use.  Also tell them if you smoke, drink alcohol, or use illegal drugs. Some items may interact with your medicine. What should I watch for while using this medicine? Your condition will be monitored carefully while you are receiving this medicine. Do not become pregnant while taking this medicine or for 6 months after stopping it. Women should inform their health care professional if they wish to become pregnant or think they might be pregnant. Men should not father a child while taking this medicine and for 4 months after stopping it. There is potential for serious side effects to an unborn child. Talk to your health care professional for more information. Do not breast-feed a child while taking this medicine or for 2 weeks after stopping it. Call your health care professional for advice if you get a fever, chills, or sore throat, or other symptoms of a cold or flu. Do not treat yourself. This medicine decreases your body's ability to fight infections. Try to avoid being around people who are sick. Avoid taking medicines that contain aspirin, acetaminophen, ibuprofen, naproxen, or ketoprofen unless instructed by your health care professional. These medicines may hide a fever. Be careful brushing or flossing your teeth or using a toothpick because you may get an infection or bleed more easily. If you have any dental work done, tell your dentist you are receiving this medicine. What side effects may I notice from receiving this medicine? Side effects that you should report to your doctor or health care professional as soon as possible:  allergic reactions like skin rash, itching or hives; swelling of the face, lips, or tongue  chest pain  nausea, vomiting  signs and symptoms  of bleeding such as bloody or black, tarry stools; red or dark-brown urine; spitting up blood or brown material that looks like coffee grounds; red spots on the skin; unusual bruising or bleeding from the eyes, gums, or nose  signs  and symptoms of infection like fever; chills; cough; sore throat; pain or trouble passing urine  signs and symptoms of liver injury like dark yellow or brown urine; general ill feeling or flu-like symptoms; light-colored stools; loss of appetite; nausea; right upper belly pain; unusually weak or tired; yellowing of the eyes or skin Side effects that usually do not require medical attention (report these to your doctor or health care professional if they continue or are bothersome):  changes in taste  constipation  diarrhea  loss of appetite  muscle pain  pain, tingling, numbness in the hands or feet  signs and symptoms of high blood sugar such as being more thirsty or hungry or having to urinate more than normal. You may also feel very tired or have blurry vision.  signs and symptoms of low magnesium like muscle cramps; muscle pain; muscle weakness; tremors; seizures; or fast, irregular heartbeat  signs and symptoms of low red blood cells or anemia such as unusually weak or tired; feeling faint or lightheaded; falls; breathing problems  stomach pain This list may not describe all possible side effects. Call your doctor for medical advice about side effects. You may report side effects to FDA at 1-800-FDA-1088. Where should I keep my medicine? This medicine is given in a hospital or clinic and will not be stored at home. NOTE: This sheet is a summary. It may not cover all possible information. If you have questions about this medicine, talk to your doctor, pharmacist, or health care provider.  2020 [employer]/Gold Standard (2018-07-03 13:00:33) Palonosetron Injection What is this medicine? PALONOSETRON (pal oh NOE se tron) is used to prevent nausea and vomiting caused by chemotherapy. It also helps prevent delayed nausea and vomiting that may occur a few days after your treatment. This medicine may be used for other purposes; ask your health care provider or pharmacist if you have  questions. COMMON BRAND NAME(S): Aloxi What should I tell my health care provider before I take this medicine? They need to know if you have any of these conditions:  an unusual or allergic reaction to palonosetron, dolasetron, granisetron, ondansetron, other medicines, foods, dyes, or preservatives  pregnant or trying to get pregnant  breast-feeding How should I use this medicine? This medicine is for infusion into a vein. It is given by a health care professional in a hospital or clinic setting. Talk to your pediatrician regarding the use of this medicine in children. While this drug may be prescribed for children as young as 1 month for selected conditions, precautions do apply. Overdosage: If you think you have taken too much of this medicine contact a poison control center or emergency room at once. NOTE: This medicine is only for you. Do not share this medicine with others. What if I miss a dose? This does not apply. What may interact with this medicine?  certain medicines for depression, anxiety, or psychotic disturbances  fentanyl  linezolid  MAOIs like Carbex, Eldepryl, Marplan, Nardil, and Parnate  methylene blue (injected into a vein)  tramadol This list may not describe all possible interactions. Give your health care provider a list of all the medicines, herbs, non-prescription drugs, or dietary supplements you use. Also tell them if you smoke, drink alcohol, or use illegal  drugs. Some items may interact with your medicine. What should I watch for while using this medicine? Your condition will be monitored carefully while you are receiving this medicine. What side effects may I notice from receiving this medicine? Side effects that you should report to your doctor or health care professional as soon as possible:  allergic reactions like skin rash, itching or hives, swelling of the face, lips, or tongue  breathing problems  confusion  dizziness  fast, irregular  heartbeat  fever and chills  loss of balance or coordination  seizures  sweating  swelling of the hands and feet  tremors  unusually weak or tired Side effects that usually do not require medical attention (report to your doctor or health care professional if they continue or are bothersome):  constipation or diarrhea  headache This list may not describe all possible side effects. Call your doctor for medical advice about side effects. You may report side effects to FDA at 1-800-FDA-1088. Where should I keep my medicine? This drug is given in a hospital or clinic and will not be stored at home. NOTE: This sheet is a summary. It may not cover all possible information. If you have questions about this medicine, talk to your doctor, pharmacist, or health care provider.  2020 Else[employer]/Gold Standard (2012-11-07 10:38:36) Dexamethasone injection What is this medicine? DEXAMETHASONE (dex a METH a sone) is a corticosteroid. It is used to treat inflammation of the skin, joints, lungs, and other organs. Common conditions treated include asthma, allergies, and arthritis. It is also used for other conditions, like blood disorders and diseases of the adrenal glands. This medicine may be used for other purposes; ask your health care provider or pharmacist if you have questions. COMMON BRAND NAME(S): Decadron, DoubleDex, Simplist Dexamethasone, Solurex What should I tell my health care provider before I take this medicine? They need to know if you have any of these conditions:  Cushing's syndrome  diabetes  glaucoma  heart disease  high blood pressure  infection like herpes, measles, tuberculosis, or chickenpox  kidney disease  liver disease  mental illness  myasthenia gravis  osteoporosis  previous heart attack  seizures  stomach or intestine problems  thyroid disease  an unusual or allergic reaction to dexamethasone, corticosteroids, other medicines, lactose, foods,  dyes, or preservatives  pregnant or trying to get pregnant  breast-feeding How should I use this medicine? This medicine is for injection into a muscle, joint, lesion, soft tissue, or vein. It is given by a health care professional in a hospital or clinic setting. Talk to your pediatrician regarding the use of this medicine in children. Special care may be needed. Overdosage: If you think you have taken too much of this medicine contact a poison control center or emergency room at once. NOTE: This medicine is only for you. Do not share this medicine with others. What if I miss a dose? This may not apply. If you are having a series of injections over a prolonged period, try not to miss an appointment. Call your doctor or health care professional to reschedule if you are unable to keep an appointment. What may interact with this medicine? Do not take this medicine with any of the following medications:  live virus vaccines This medicine may also interact with the following medications:  aminoglutethimide  amphotericin B  aspirin and aspirin-like medicines  certain antibiotics like erythromycin, clarithromycin, and troleandomycin  certain antivirals for HIV or hepatitis  certain medicines for seizures like carbamazepine, phenobarbital,  phenytoin  certain medicines to treat myasthenia gravis  cholestyramine  cyclosporine  digoxin  diuretics  ephedrine  male hormones, like estrogen or progestins and birth control pills  insulin or other medicines for diabetes  isoniazid  ketoconazole  medicines that relax muscles for surgery  mifepristone  NSAIDs, medicines for pain and inflammation, like ibuprofen or naproxen  rifampin  skin tests for allergies  thalidomide  vaccines  warfarin This list may not describe all possible interactions. Give your health care provider a list of all the medicines, herbs, non-prescription drugs, or dietary supplements you use.  Also tell them if you smoke, drink alcohol, or use illegal drugs. Some items may interact with your medicine. What should I watch for while using this medicine? Visit your health care professional for regular checks on your progress. Tell your health care professional if your symptoms do not start to get better or if they get worse. Your condition will be monitored carefully while you are receiving this medicine. Wear a medical ID bracelet or chain. Carry a card that describes your disease and details of your medicine and dosage times. This medicine may increase your risk of getting an infection. Call your health care professional for advice if you get a fever, chills, or sore throat, or other symptoms of a cold or flu. Do not treat yourself. Try to avoid being around people who are sick. Call your health care professional if you are around anyone with measles, chickenpox, or if you develop sores or blisters that do not heal properly. If you are going to need surgery or other procedures, tell your doctor or health care professional that you have taken this medicine within the last 12 months. Ask your doctor or health care professional about your diet. You may need to lower the amount of salt you eat. This medicine may increase blood sugar. Ask your healthcare provider if changes in diet or medicines are needed if you have diabetes. What side effects may I notice from receiving this medicine? Side effects that you should report to your doctor or health care professional as soon as possible:  allergic reactions like skin rash, itching or hives, swelling of the face, lips, or tongue  bloody or black, tarry stools  changes in emotions or moods  changes in vision  confusion, excitement, restlessness  depressed mood  eye pain  hallucinations  muscle weakness  severe or sudden stomach or belly pain  signs and symptoms of high blood sugar such as being more thirsty or hungry or having to  urinate more than normal. You may also feel very tired or have blurry vision.  signs and symptoms of infection like fever; chills; cough; sore throat; pain or trouble passing urine  swelling of ankles, feet  unusual bruising or bleeding  wounds that do not heal Side effects that usually do not require medical attention (report to your doctor or health care professional if they continue or are bothersome):  increased appetite  increased growth of face or body hair  headache  nausea, vomiting  pain, redness, or irritation at site where injected  skin problems, acne, thin and shiny skin  trouble sleeping  weight gain This list may not describe all possible side effects. Call your doctor for medical advice about side effects. You may report side effects to FDA at 1-800-FDA-1088. Where should I keep my medicine? This medicine is given in a hospital or clinic and will not be stored at home. NOTE: This sheet is a  summary. It may not cover all possible information. If you have questions about this medicine, talk to your doctor, pharmacist, or health care provider.  2020 [employer]/Gold Standard (2018-07-15 13:51:58)

## 2019-11-26 NOTE — Progress Notes (Signed)
Pt discharged in no apparent distress. Pt left ambulatory without assistance. Pt aware of discharge instructions and verbalized understanding and had no further questions.  

## 2019-11-26 NOTE — Patient Instructions (Signed)

## 2019-11-27 ENCOUNTER — Telehealth: Payer: Self-pay

## 2019-11-27 NOTE — Telephone Encounter (Signed)
Called pt per 11/26/19 LOS and he is aware of his 12/18/19 appt and that he will rec a call for his scan appt... AOM

## 2019-11-29 LAB — CHROMOGRANIN A: Chromogranin A (ng/mL): 765.2 ng/mL — ABNORMAL HIGH (ref 0.0–101.8)

## 2019-11-30 MED FILL — PANTOPRAZOLE SOD DR 40 MG T: 40 | 30 days supply | Qty: 30 | Fill #4

## 2019-12-01 ENCOUNTER — Other Ambulatory Visit (HOSPITAL_BASED_OUTPATIENT_CLINIC_OR_DEPARTMENT_OTHER): Payer: Self-pay | Admitting: Internal Medicine

## 2019-12-01 MED FILL — EZETIMIBE 10 MG TABS: 10 | 90 days supply | Qty: 90 | Fill #0

## 2019-12-02 ENCOUNTER — Telehealth: Payer: Self-pay

## 2019-12-02 NOTE — Telephone Encounter (Signed)
Called pt and he is aware of his PET on 12/6, per Vanessa-Dr PE aware that PET is after 12/1 appt due to shortage of nuc med meds   AOM

## 2019-12-16 ENCOUNTER — Inpatient Hospital Stay: Payer: Medicare Other

## 2019-12-16 ENCOUNTER — Inpatient Hospital Stay: Payer: Medicare Other | Attending: Hematology & Oncology

## 2019-12-16 ENCOUNTER — Other Ambulatory Visit: Payer: Self-pay

## 2019-12-16 ENCOUNTER — Inpatient Hospital Stay (HOSPITAL_BASED_OUTPATIENT_CLINIC_OR_DEPARTMENT_OTHER): Payer: Medicare Other | Admitting: Hematology & Oncology

## 2019-12-16 ENCOUNTER — Encounter: Payer: Self-pay | Admitting: Hematology & Oncology

## 2019-12-16 VITALS — BP 109/56 | HR 91 | Temp 98.6°F | Resp 20 | Wt 151.0 lb

## 2019-12-16 DIAGNOSIS — C7A8 Other malignant neuroendocrine tumors: Secondary | ICD-10-CM | POA: Diagnosis not present

## 2019-12-16 DIAGNOSIS — K909 Intestinal malabsorption, unspecified: Secondary | ICD-10-CM | POA: Insufficient documentation

## 2019-12-16 DIAGNOSIS — Z885 Allergy status to narcotic agent status: Secondary | ICD-10-CM | POA: Insufficient documentation

## 2019-12-16 DIAGNOSIS — D500 Iron deficiency anemia secondary to blood loss (chronic): Secondary | ICD-10-CM

## 2019-12-16 DIAGNOSIS — C7951 Secondary malignant neoplasm of bone: Secondary | ICD-10-CM

## 2019-12-16 DIAGNOSIS — Z5111 Encounter for antineoplastic chemotherapy: Secondary | ICD-10-CM | POA: Diagnosis not present

## 2019-12-16 DIAGNOSIS — C7A00 Malignant carcinoid tumor of unspecified site: Secondary | ICD-10-CM | POA: Insufficient documentation

## 2019-12-16 DIAGNOSIS — D509 Iron deficiency anemia, unspecified: Secondary | ICD-10-CM | POA: Insufficient documentation

## 2019-12-16 DIAGNOSIS — C61 Malignant neoplasm of prostate: Secondary | ICD-10-CM | POA: Diagnosis not present

## 2019-12-16 DIAGNOSIS — M6250 Muscle wasting and atrophy, not elsewhere classified, unspecified site: Secondary | ICD-10-CM | POA: Diagnosis not present

## 2019-12-16 DIAGNOSIS — C7B8 Other secondary neuroendocrine tumors: Secondary | ICD-10-CM | POA: Diagnosis not present

## 2019-12-16 DIAGNOSIS — Z79899 Other long term (current) drug therapy: Secondary | ICD-10-CM | POA: Insufficient documentation

## 2019-12-16 LAB — CMP (CANCER CENTER ONLY)
ALT: 10 U/L (ref 0–44)
AST: 14 U/L — ABNORMAL LOW (ref 15–41)
Albumin: 3.8 g/dL (ref 3.5–5.0)
Alkaline Phosphatase: 58 U/L (ref 38–126)
Anion gap: 8 (ref 5–15)
BUN: 16 mg/dL (ref 8–23)
CO2: 26 mmol/L (ref 22–32)
Calcium: 9.2 mg/dL (ref 8.9–10.3)
Chloride: 104 mmol/L (ref 98–111)
Creatinine: 1.28 mg/dL — ABNORMAL HIGH (ref 0.61–1.24)
GFR, Estimated: 60 mL/min — ABNORMAL LOW (ref >60)
Glucose, Bld: 149 mg/dL — ABNORMAL HIGH (ref 70–99)
Potassium: 3.6 mmol/L (ref 3.5–5.1)
Sodium: 138 mmol/L (ref 135–145)
Total Bilirubin: 0.2 mg/dL — ABNORMAL LOW (ref 0.3–1.2)
Total Protein: 6.3 g/dL — ABNORMAL LOW (ref 6.5–8.1)

## 2019-12-16 LAB — CBC WITH DIFFERENTIAL (CANCER CENTER ONLY)
Abs Immature Granulocytes: 0.02 10*3/uL (ref 0.00–0.07)
Basophils Absolute: 0 10*3/uL (ref 0.0–0.1)
Basophils Relative: 1 %
Eosinophils Absolute: 0.4 10*3/uL (ref 0.0–0.5)
Eosinophils Relative: 8 %
HCT: 28.7 % — ABNORMAL LOW (ref 39.0–52.0)
Hemoglobin: 8.9 g/dL — ABNORMAL LOW (ref 13.0–17.0)
Immature Granulocytes: 1 %
Lymphocytes Relative: 16 %
Lymphs Abs: 0.7 10*3/uL (ref 0.7–4.0)
MCH: 32.1 pg (ref 26.0–34.0)
MCHC: 31 g/dL (ref 30.0–36.0)
MCV: 103.6 fL — ABNORMAL HIGH (ref 80.0–100.0)
Monocytes Absolute: 0.7 10*3/uL (ref 0.1–1.0)
Monocytes Relative: 17 %
Neutro Abs: 2.5 10*3/uL (ref 1.7–7.7)
Neutrophils Relative %: 57 %
Platelet Count: 342 10*3/uL (ref 150–400)
RBC: 2.77 MIL/uL — ABNORMAL LOW (ref 4.22–5.81)
RDW: 14.6 % (ref 11.5–15.5)
WBC Count: 4.4 10*3/uL (ref 4.0–10.5)
nRBC: 0 % (ref 0.0–0.2)

## 2019-12-16 LAB — FERRITIN: Ferritin: 519 ng/mL — ABNORMAL HIGH (ref 24–336)

## 2019-12-16 LAB — LACTATE DEHYDROGENASE: LDH: 143 U/L (ref 98–192)

## 2019-12-16 LAB — IRON AND TIBC
Iron: 63 ug/dL (ref 42–163)
Saturation Ratios: 25 % (ref 20–55)
TIBC: 249 ug/dL (ref 202–409)
UIBC: 185 ug/dL (ref 117–376)

## 2019-12-16 MED ORDER — SODIUM CHLORIDE 0.9 % IV SOLN
3.2000 mg/m2 | Freq: Once | INTRAVENOUS | Status: AC
  Administered 2019-12-16: 5.8 mg via INTRAVENOUS
  Filled 2019-12-16: qty 11.6

## 2019-12-16 MED ORDER — SODIUM CHLORIDE 0.9% FLUSH
10.0000 mL | INTRAVENOUS | Status: DC | PRN
  Administered 2019-12-16: 10 mL
  Filled 2019-12-16: qty 10

## 2019-12-16 MED ORDER — HEPARIN SOD (PORK) LOCK FLUSH 100 UNIT/ML IV SOLN
500.0000 [IU] | Freq: Once | INTRAVENOUS | Status: AC | PRN
  Administered 2019-12-16: 500 [IU]
  Filled 2019-12-16: qty 5

## 2019-12-16 MED ORDER — PALONOSETRON HCL INJECTION 0.25 MG/5ML
INTRAVENOUS | Status: AC
  Filled 2019-12-16: qty 5

## 2019-12-16 MED ORDER — PALONOSETRON HCL INJECTION 0.25 MG/5ML
0.2500 mg | Freq: Once | INTRAVENOUS | Status: AC
  Administered 2019-12-16: 0.25 mg via INTRAVENOUS

## 2019-12-16 MED ORDER — SODIUM CHLORIDE 0.9 % IV SOLN
Freq: Once | INTRAVENOUS | Status: AC
  Filled 2019-12-16: qty 250

## 2019-12-16 MED ORDER — SODIUM CHLORIDE 0.9 % IV SOLN
10.0000 mg | Freq: Once | INTRAVENOUS | Status: AC
  Administered 2019-12-16: 10 mg via INTRAVENOUS
  Filled 2019-12-16: qty 10

## 2019-12-16 NOTE — Patient Instructions (Addendum)
Lurbinectedin Injection What is this medicine? LURBINECTEDIN (LOOR bin EK te din) is a chemotherapy drug. This medicine is used to treat lung cancer. This medicine may be used for other purposes; ask your health care provider or pharmacist if you have questions. COMMON BRAND NAME(S): ZEPZELCA What should I tell my health care provider before I take this medicine? They need to know if you have any of these conditions:  infection (especially a viral infection such as chickenpox, cold sores, or herpes)  liver disease  low blood counts, like white cells, platelets, or red blood cells  an unusual or allergic reaction to lurbinectedin, other medicines, foods, dyes or preservatives  pregnant or trying to get pregnant  breast-feeding How should I use this medicine? This medicine is for infusion into a vein. It is given by a healthcare professional in a hospital or clinic setting. Talk to your pediatrician about the use of this medicine in children. Special care may be needed. Overdosage: If you think you have taken too much of this medicine contact a poison control center or emergency room at once. NOTE: This medicine is only for you. Do not share this medicine with others. What if I miss a dose? Keep appointments for follow-up doses. It is important not to miss your dose. Call your doctor or health care professional if you are unable to keep an appointment. What may interact with this medicine?  certain antibiotics like erythromycin or clarithromycin  certain antivirals for HIV or hepatitis  certain medicines for fungal infections like ketoconazole, itraconazole, or posaconazole  certain medicines for seizures like carbamazepine, phenobarbital, phenytoin  grapefruit or grapefruit juice  St. John's Wort This list may not describe all possible interactions. Give your health care provider a list of all the medicines, herbs, non-prescription drugs, or dietary supplements you use. Also  tell them if you smoke, drink alcohol, or use illegal drugs. Some items may interact with your medicine. What should I watch for while using this medicine? Your condition will be monitored carefully while you are receiving this medicine. Do not become pregnant while taking this medicine or for 6 months after stopping it. Women should inform their health care professional if they wish to become pregnant or think they might be pregnant. Men should not father a child while taking this medicine and for 4 months after stopping it. There is potential for serious side effects to an unborn child. Talk to your health care professional for more information. Do not breast-feed a child while taking this medicine or for 2 weeks after stopping it. Call your health care professional for advice if you get a fever, chills, or sore throat, or other symptoms of a cold or flu. Do not treat yourself. This medicine decreases your body's ability to fight infections. Try to avoid being around people who are sick. Avoid taking medicines that contain aspirin, acetaminophen, ibuprofen, naproxen, or ketoprofen unless instructed by your health care professional. These medicines may hide a fever. Be careful brushing or flossing your teeth or using a toothpick because you may get an infection or bleed more easily. If you have any dental work done, tell your dentist you are receiving this medicine. What side effects may I notice from receiving this medicine? Side effects that you should report to your doctor or health care professional as soon as possible:  allergic reactions like skin rash, itching or hives; swelling of the face, lips, or tongue  chest pain  nausea, vomiting  signs and symptoms of  bleeding such as bloody or black, tarry stools; red or dark-brown urine; spitting up blood or brown material that looks like coffee grounds; red spots on the skin; unusual bruising or bleeding from the eyes, gums, or nose  signs and  symptoms of infection like fever; chills; cough; sore throat; pain or trouble passing urine  signs and symptoms of liver injury like dark yellow or brown urine; general ill feeling or flu-like symptoms; light-colored stools; loss of appetite; nausea; right upper belly pain; unusually weak or tired; yellowing of the eyes or skin Side effects that usually do not require medical attention (report these to your doctor or health care professional if they continue or are bothersome):  changes in taste  constipation  diarrhea  loss of appetite  muscle pain  pain, tingling, numbness in the hands or feet  signs and symptoms of high blood sugar such as being more thirsty or hungry or having to urinate more than normal. You may also feel very tired or have blurry vision.  signs and symptoms of low magnesium like muscle cramps; muscle pain; muscle weakness; tremors; seizures; or fast, irregular heartbeat  signs and symptoms of low red blood cells or anemia such as unusually weak or tired; feeling faint or lightheaded; falls; breathing problems  stomach pain This list may not describe all possible side effects. Call your doctor for medical advice about side effects. You may report side effects to FDA at 1-800-FDA-1088. Where should I keep my medicine? This medicine is given in a hospital or clinic and will not be stored at home. NOTE: This sheet is a summary. It may not cover all possible information. If you have questions about this medicine, talk to your doctor, pharmacist, or health care provider.  2020 Elsevier/Gold Standard (2018-07-03 13:00:33) Dexamethasone injection What is this medicine? DEXAMETHASONE (dex a METH a sone) is a corticosteroid. It is used to treat inflammation of the skin, joints, lungs, and other organs. Common conditions treated include asthma, allergies, and arthritis. It is also used for other conditions, like blood disorders and diseases of the adrenal glands. This  medicine may be used for other purposes; ask your health care provider or pharmacist if you have questions. COMMON BRAND NAME(S): Decadron, DoubleDex, Simplist Dexamethasone, Solurex What should I tell my health care provider before I take this medicine? They need to know if you have any of these conditions: Cushing's syndrome diabetes glaucoma heart disease high blood pressure infection like herpes, measles, tuberculosis, or chickenpox kidney disease liver disease mental illness myasthenia gravis osteoporosis previous heart attack seizures stomach or intestine problems thyroid disease an unusual or allergic reaction to dexamethasone, corticosteroids, other medicines, lactose, foods, dyes, or preservatives pregnant or trying to get pregnant breast-feeding How should I use this medicine? This medicine is for injection into a muscle, joint, lesion, soft tissue, or vein. It is given by a health care professional in a hospital or clinic setting. Talk to your pediatrician regarding the use of this medicine in children. Special care may be needed. Overdosage: If you think you have taken too much of this medicine contact a poison control center or emergency room at once. NOTE: This medicine is only for you. Do not share this medicine with others. What if I miss a dose? This may not apply. If you are having a series of injections over a prolonged period, try not to miss an appointment. Call your doctor or health care professional to reschedule if you are unable to keep an appointment.  What may interact with this medicine? Do not take this medicine with any of the following medications: live virus vaccines This medicine may also interact with the following medications: aminoglutethimide amphotericin B aspirin and aspirin-like medicines certain antibiotics like erythromycin, clarithromycin, and troleandomycin certain antivirals for HIV or hepatitis certain medicines for seizures like  carbamazepine, phenobarbital, phenytoin certain medicines to treat myasthenia gravis cholestyramine cyclosporine digoxin diuretics ephedrine male hormones, like estrogen or progestins and birth control pills insulin or other medicines for diabetes isoniazid ketoconazole medicines that relax muscles for surgery mifepristone NSAIDs, medicines for pain and inflammation, like ibuprofen or naproxen rifampin skin tests for allergies thalidomide vaccines warfarin This list may not describe all possible interactions. Give your health care provider a list of all the medicines, herbs, non-prescription drugs, or dietary supplements you use. Also tell them if you smoke, drink alcohol, or use illegal drugs. Some items may interact with your medicine. What should I watch for while using this medicine? Visit your health care professional for regular checks on your progress. Tell your health care professional if your symptoms do not start to get better or if they get worse. Your condition will be monitored carefully while you are receiving this medicine. Wear a medical ID bracelet or chain. Carry a card that describes your disease and details of your medicine and dosage times. This medicine may increase your risk of getting an infection. Call your health care professional for advice if you get a fever, chills, or sore throat, or other symptoms of a cold or flu. Do not treat yourself. Try to avoid being around people who are sick. Call your health care professional if you are around anyone with measles, chickenpox, or if you develop sores or blisters that do not heal properly. If you are going to need surgery or other procedures, tell your doctor or health care professional that you have taken this medicine within the last 12 months. Ask your doctor or health care professional about your diet. You may need to lower the amount of salt you eat. This medicine may increase blood sugar. Ask your healthcare  provider if changes in diet or medicines are needed if you have diabetes. What side effects may I notice from receiving this medicine? Side effects that you should report to your doctor or health care professional as soon as possible: allergic reactions like skin rash, itching or hives, swelling of the face, lips, or tongue bloody or black, tarry stools changes in emotions or moods changes in vision confusion, excitement, restlessness depressed mood eye pain hallucinations muscle weakness severe or sudden stomach or belly pain signs and symptoms of high blood sugar such as being more thirsty or hungry or having to urinate more than normal. You may also feel very tired or have blurry vision. signs and symptoms of infection like fever; chills; cough; sore throat; pain or trouble passing urine swelling of ankles, feet unusual bruising or bleeding wounds that do not heal Side effects that usually do not require medical attention (report to your doctor or health care professional if they continue or are bothersome): increased appetite increased growth of face or body hair headache nausea, vomiting pain, redness, or irritation at site where injected skin problems, acne, thin and shiny skin trouble sleeping weight gain This list may not describe all possible side effects. Call your doctor for medical advice about side effects. You may report side effects to FDA at 1-800-FDA-1088. Where should I keep my medicine? This medicine is given in a  hospital or clinic and will not be stored at home. NOTE: This sheet is a summary. It may not cover all possible information. If you have questions about this medicine, talk to your doctor, pharmacist, or health care provider.  2020 Elsevier/Gold Standard (2018-07-15 13:51:58) Palonosetron Injection What is this medicine? PALONOSETRON (pal oh NOE se tron) is used to prevent nausea and vomiting caused by chemotherapy. It also helps prevent delayed nausea  and vomiting that may occur a few days after your treatment. This medicine may be used for other purposes; ask your health care provider or pharmacist if you have questions. COMMON BRAND NAME(S): Aloxi What should I tell my health care provider before I take this medicine? They need to know if you have any of these conditions: an unusual or allergic reaction to palonosetron, dolasetron, granisetron, ondansetron, other medicines, foods, dyes, or preservatives pregnant or trying to get pregnant breast-feeding How should I use this medicine? This medicine is for infusion into a vein. It is given by a health care professional in a hospital or clinic setting. Talk to your pediatrician regarding the use of this medicine in children. While this drug may be prescribed for children as young as 1 month for selected conditions, precautions do apply. Overdosage: If you think you have taken too much of this medicine contact a poison control center or emergency room at once. NOTE: This medicine is only for you. Do not share this medicine with others. What if I miss a dose? This does not apply. What may interact with this medicine? certain medicines for depression, anxiety, or psychotic disturbances fentanyl linezolid MAOIs like Carbex, Eldepryl, Marplan, Nardil, and Parnate methylene blue (injected into a vein) tramadol This list may not describe all possible interactions. Give your health care provider a list of all the medicines, herbs, non-prescription drugs, or dietary supplements you use. Also tell them if you smoke, drink alcohol, or use illegal drugs. Some items may interact with your medicine. What should I watch for while using this medicine? Your condition will be monitored carefully while you are receiving this medicine. What side effects may I notice from receiving this medicine? Side effects that you should report to your doctor or health care professional as soon as possible: allergic  reactions like skin rash, itching or hives, swelling of the face, lips, or tongue breathing problems confusion dizziness fast, irregular heartbeat fever and chills loss of balance or coordination seizures sweating swelling of the hands and feet tremors unusually weak or tired Side effects that usually do not require medical attention (report to your doctor or health care professional if they continue or are bothersome): constipation or diarrhea headache This list may not describe all possible side effects. Call your doctor for medical advice about side effects. You may report side effects to FDA at 1-800-FDA-1088. Where should I keep my medicine? This drug is given in a hospital or clinic and will not be stored at home. NOTE: This sheet is a summary. It may not cover all possible information. If you have questions about this medicine, talk to your doctor, pharmacist, or health care provider.  2020 Elsevier/Gold Standard (2012-11-07 10:38:36) Pt stable upon dischargePt discharged in no apparent distress. Pt left ambulatory without assistance. Pt aware of discharge instructions and verbalized understanding and had no further questions.

## 2019-12-16 NOTE — Progress Notes (Signed)
Hematology and Oncology Follow Up Visit  [name] [id number] Dec 18, 1945 74 y.o. 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 Iron def anemia -- malabsorption  Past Therapy: Zytiga 1000 mg by mouth daily - discontinued on 08/15/2016 Xtandi 160 mg po q day - start on 08/15/2016 - discontinued Radium-223 therapy -- s/p cycle #6 Palliative radiation to the right sacrum Lutathera - s/p cycle 4/4 --last dose on 07/11/2017 Carbo/VP-16/Tecentriq --  S/p cycle #5 Keytruda q 6 week -- Maintanence -- s/p cycle #3 - changed from 3 to 6 week on 06/30/2018 Taxotere/Keytruda -- start on 09/17/2018 -- s/p cycle #10 -- d/c due to progression  Current Therapy:        Lurbinectedin -- started on 04/29/2019, s/p cycle #11 Xgeva 120 mg subcutaneous Q3 months - due in 01/2020 Lupron 22 mg IM every 3 months - due in 01/2020  IV iron as indicated    Interim History:  [name] is here today for follow-up.  Overall, he is doing okay although he has new symptoms of vertigo.  This seems to happen in the morning.  It is not every morning.  Last about 20 minutes.  He has little bit of nausea with it.  We will probably have to get an MRI of the brain to make sure there is nothing going on up there.  He has had no problems with cough or shortness of breath.  He has had no bleeding.  He has had no change in bowel or bladder habits.  He has had no problems with fever.  His last chromogranin A level was 765.  We will watch his iron studies.  Back in October his iron saturation was 20%.  He has had a good appetite.  He did have a nice Thanksgiving.  His wife's 70th birthday was back in November.  They had a wonderful time.  He has had no rashes.  He has had no mouth sores.  There is been no double vision.  Overall, I would say his performance status is ECOG 1.    Medications:  Allergies as of  12/16/2019      Reactions   Codeine Nausea And Vomiting   Hydrocodone Nausea And Vomiting      Medication List       Accurate as of December 16, 2019  9:18 AM. If you have any questions, ask your nurse or doctor.        STOP taking these medications   amoxicillin 500 MG capsule Commonly known as: AMOXIL Stopped by: [name], MD   docusate sodium 100 MG capsule Commonly known as: COLACE Stopped by: [name], MD     TAKE these medications   aspirin 81 MG tablet Take 81 mg by mouth daily.   CALCIUM-IRON-VIT D-VIT K PO Take 1 tablet by mouth 2 (two) times daily.   dexamethasone 4 MG tablet Commonly known as: DECADRON TAKE 2 TABLETS (8 MG TOTAL) BY MOUTH 2 (TWO) TIMES DAILY FOR 5 DAYS. START 2 DAYS BEFORE EACH CHEMOTHERAPY. What changed: See the new instructions.   gabapentin 300 MG capsule Commonly known as: NEURONTIN TAKE 1 CAPSULE BY MOUTH TWICE A DAY THEN TAKE 2 CAPSULES AT BEDTIME   Generlac 10 GM/15ML Soln Generic drug: lactulose (encephalopathy) SMARTSIG:45 Milliliter(s) By Mouth Daily PRN   isosorbide mononitrate 30 MG 24 hr tablet Commonly known as: IMDUR Take 30 mg by mouth daily.  lidocaine-prilocaine cream Commonly known as: EMLA   nitroGLYCERIN 0.4 MG SL tablet Commonly known as: NITROSTAT Place 1 tablet (0.4 mg total) under the tongue every 5 (five) minutes as needed for chest pain.   ondansetron 8 MG tablet Commonly known as: ZOFRAN Take 1 tablet (8 mg total) by mouth 2 (two) times daily as needed for nausea or vomiting.   OVER THE COUNTER MEDICATION every morning. Juice Plus -- 8 capsule of each the garden, vineyard, and orchard twice a day.   Oyster Shell Calcium/D 250-125 MG-UNIT Tabs Take by mouth daily.   pantoprazole 40 MG tablet Commonly known as: PROTONIX TAKE 1 TABLET (40 MG TOTAL) BY MOUTH AT BEDTIME.   prochlorperazine 10 MG tablet Commonly known as: COMPAZINE TAKE 1 TABLET (10 MG TOTAL) BY MOUTH EVERY 6 (SIX)  HOURS AS NEEDED (NAUSEA OR VOMITING).   senna-docusate 8.6-50 MG tablet Commonly known as: Senokot-S Take 2-4 tablets by mouth at bedtime.   sucralfate 1 GM/10ML suspension Commonly known as: Carafate Take 10 mLs (1 g total) by mouth 4 (four) times daily -  with meals and at bedtime. What changed:   when to take this  reasons to take this   Zetia 10 MG tablet Generic drug: ezetimibe Take 10 mg by mouth daily.   Zinc 50 MG Caps Take 50 mg by mouth daily.       Allergies:  Allergies  Allergen Reactions  . Codeine Nausea And Vomiting  . Hydrocodone Nausea And Vomiting    Past Medical History, Surgical history, Social history, and Family History were reviewed and updated.  Review of Systems: Review of Systems  Constitutional: Negative.   HENT: Negative.   Eyes: Negative.   Respiratory: Negative.   Cardiovascular: Negative.   Gastrointestinal: Negative.   Genitourinary: Negative.   Musculoskeletal: Negative.   Skin: Negative.   Neurological: Negative.   Endo/Heme/Allergies: Negative.   Psychiatric/Behavioral: Negative.      Physical Exam:  weight is 151 lb (68.5 kg). His oral temperature is 98.6 F (37 C). His blood pressure is 109/56 (abnormal) and his pulse is 91. His respiration is 20 and oxygen saturation is 96%.   Wt Readings from Last 3 Encounters:  12/16/19 151 lb (68.5 kg)  11/26/19 147 lb (66.7 kg)  11/05/19 147 lb (66.7 kg)    Physical Exam Vitals reviewed.  HENT:     Head: Normocephalic and atraumatic.  Eyes:     Pupils: Pupils are equal, round, and reactive to light.  Cardiovascular:     Rate and Rhythm: Normal rate and regular rhythm.     Heart sounds: Normal heart sounds.  Pulmonary:     Effort: Pulmonary effort is normal.     Breath sounds: Normal breath sounds.  Abdominal:     General: Bowel sounds are normal.     Palpations: Abdomen is soft.  Musculoskeletal:        General: No tenderness or deformity. Normal range of motion.      Cervical back: Normal range of motion.     Comments: Extremities shows the brace on the right lower leg.  There is a little bit of muscle atrophy.  He has decent strength in both legs.  He has decent range of motion of his joints.  Pulses are intact in the lower extremities.  Lymphadenopathy:     Cervical: No cervical adenopathy.  Skin:    General: Skin is warm and dry.     Findings: No erythema or rash.  Neurological:  Mental Status: He is alert and oriented to person, place, and time.  Psychiatric:        Behavior: Behavior normal.        Thought Content: Thought content normal.        Judgment: Judgment normal.      Lab Results  Component Value Date   WBC 4.4 12/16/2019   HGB 8.9 (L) 12/16/2019   HCT 28.7 (L) 12/16/2019   MCV 103.6 (H) 12/16/2019   PLT 342 12/16/2019   Lab Results  Component Value Date   FERRITIN 991 (H) 11/05/2019   IRON 44 11/05/2019   TIBC 221 11/05/2019   UIBC 178 11/05/2019   IRONPCTSAT 20 11/05/2019   Lab Results  Component Value Date   RETICCTPCT 1.6 04/08/2019   RBC 2.77 (L) 12/16/2019   No results found for: KPAFRELGTCHN, LAMBDASER, KAPLAMBRATIO No results found for: IGGSERUM, IGA, IGMSERUM No results found for: Odetta Pink, SPEI   Chemistry      Component Value Date/Time   NA 138 12/16/2019 0825   NA 144 12/14/2016 1159   NA 140 10/22/2016 1129   K 3.6 12/16/2019 0825   K 3.5 12/14/2016 1159   K 4.3 10/22/2016 1129   CL 104 12/16/2019 0825   CL 103 12/14/2016 1159   CO2 26 12/16/2019 0825   CO2 30 12/14/2016 1159   CO2 27 10/22/2016 1129   BUN 16 12/16/2019 0825   BUN 16 12/14/2016 1159   BUN 14.0 10/22/2016 1129   CREATININE 1.28 (H) 12/16/2019 0825   CREATININE 1.3 (H) 12/14/2016 1159   CREATININE 1.1 10/22/2016 1129      Component Value Date/Time   CALCIUM 9.2 12/16/2019 0825   CALCIUM 10.0 12/14/2016 1159   CALCIUM 10.4 10/22/2016 1129   ALKPHOS 58 12/16/2019  0825   ALKPHOS 48 12/14/2016 1159   ALKPHOS 60 10/22/2016 1129   AST 14 (L) 12/16/2019 0825   AST 16 10/22/2016 1129   ALT 10 12/16/2019 0825   ALT 23 12/14/2016 1159   ALT 14 10/22/2016 1129   BILITOT 0.2 (L) 12/16/2019 0825   BILITOT 0.31 10/22/2016 1129       Impression and Plan: [name] is 74 yo caucasian gentleman with metastatic neuroendocrine carcinoma of unknown   primary.  Again, we will have to get the MRI of the brain.  I know he set up with a PET scan in a week or so.  I am not sure exactly whether the vertigo is all about.  I do not think we have to give him any Antivert for right now.  These episodes are not that frequent.  We will go ahead with his 12th cycle of Lurbinectiden.  Again he has his scans coming up in a week or so.  We will plan to get him back to see [place] in another 3 weeks.   [name], MD 12/1/20219:18 AM

## 2019-12-16 NOTE — Patient Instructions (Signed)
Implanted Port Insertion, Care After °This sheet gives you information about how to care for yourself after your procedure. Your health care provider may also give you more specific instructions. If you have problems or questions, contact your health care provider. °What can I expect after the procedure? °After the procedure, it is common to have: °· Discomfort at the port insertion site. °· Bruising on the skin over the port. This should improve over 3-4 days. °Follow these instructions at home: °Port care °· After your port is placed, you will get a manufacturer's information card. The card has information about your port. Keep this card with you at all times. °· Take care of the port as told by your health care provider. Ask your health care provider if you or a family member can get training for taking care of the port at home. A home health care nurse may also take care of the port. °· Make sure to remember what type of port you have. °Incision care ° °  ° °· Follow instructions from your health care provider about how to take care of your port insertion site. Make sure you: °? Wash your hands with soap and water before and after you change your bandage (dressing). If soap and water are not available, use hand sanitizer. °? Change your dressing as told by your health care provider. °? Leave stitches (sutures), skin glue, or adhesive strips in place. These skin closures may need to stay in place for 2 weeks or longer. If adhesive strip edges start to loosen and curl up, you may trim the loose edges. Do not remove adhesive strips completely unless your health care provider tells you to do that. °· Check your port insertion site every day for signs of infection. Check for: °? Redness, swelling, or pain. °? Fluid or blood. °? Warmth. °? Pus or a bad smell. °Activity °· Return to your normal activities as told by your health care provider. Ask your health care provider what activities are safe for you. °· Do not  lift anything that is heavier than 10 lb (4.5 kg), or the limit that you are told, until your health care provider says that it is safe. °General instructions °· Take over-the-counter and prescription medicines only as told by your health care provider. °· Do not take baths, swim, or use a hot tub until your health care provider approves. Ask your health care provider if you may take showers. You may only be allowed to take sponge baths. °· Do not drive for 24 hours if you were given a sedative during your procedure. °· Wear a medical alert bracelet in case of an emergency. This will tell any health care providers that you have a port. °· Keep all follow-up visits as told by your health care provider. This is important. °Contact a health care provider if: °· You cannot flush your port with saline as directed, or you cannot draw blood from the port. °· You have a fever or chills. °· You have redness, swelling, or pain around your port insertion site. °· You have fluid or blood coming from your port insertion site. °· Your port insertion site feels warm to the touch. °· You have pus or a bad smell coming from the port insertion site. °Get help right away if: °· You have chest pain or shortness of breath. °· You have bleeding from your port that you cannot control. °Summary °· Take care of the port as told by your health   care provider. Keep the manufacturer's information card with you at all times. °· Change your dressing as told by your health care provider. °· Contact a health care provider if you have a fever or chills or if you have redness, swelling, or pain around your port insertion site. °· Keep all follow-up visits as told by your health care provider. °This information is not intended to replace advice given to you by your health care provider. Make sure you discuss any questions you have with your health care provider. °Document Revised: 07/30/2017 Document Reviewed: 07/30/2017 °Elsevier Patient Education ©  2020 [employer] ° °

## 2019-12-17 LAB — CHROMOGRANIN A: Chromogranin A (ng/mL): 899.1 ng/mL — ABNORMAL HIGH (ref 0.0–101.8)

## 2019-12-18 ENCOUNTER — Other Ambulatory Visit: Payer: Medicare Other

## 2019-12-18 ENCOUNTER — Ambulatory Visit: Payer: Medicare Other | Admitting: Hematology & Oncology

## 2019-12-18 ENCOUNTER — Ambulatory Visit: Payer: Medicare Other

## 2019-12-18 IMAGING — PT NM PET NOPR SKULL BASE TO THIGH
1 of 8 series · 1 of 25 positions shown · non-contrast
Comparison: DOTATATE PET scan 01/21/2018, [DATE] [DATE] [DATE]

CLINICAL DATA: Neuroendocrine tumor of potential prostate origin.
Pulmonary metastasis. Patient status post 4uCAA DOTATATE peptide
receptor therapy with subsequent progression. Patient post 2 rounds
of chemotherapy.

EXAM:
NUCLEAR MEDICINE PET SKULL BASE TO THIGH
TECHNIQUE: 3.5 mCi Ga 68 DOTATATE was injected intravenously. Full-ring PET
imaging was performed from the skull base to thigh after the
radiotracer. CT data was obtained and used for attenuation
correction and anatomic localization.

[Series 4: ct sk_thigh 5.0 b31f · axial · 5.0mm · 0.98mm/px · 1 of 229 slices shown]
[im 229/229  brain]
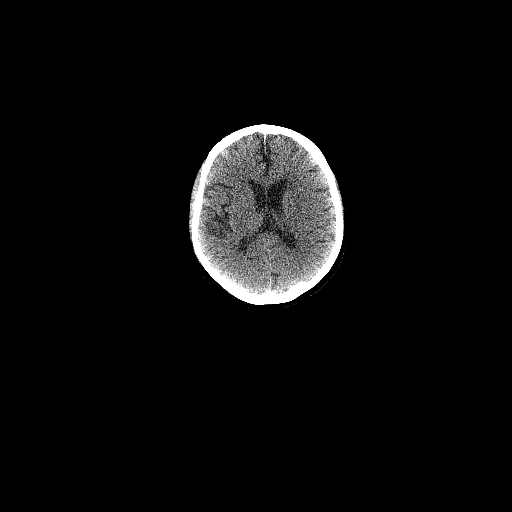

[1 of 25 positions shown; findings below may reference images not displayed]

FINDINGS: NECK

No radiotracer activity in neck lymph nodes.

Incidental CT findings: None

CHEST

Marked reduction in size of the bilateral pulmonary nodules.

Example nodule in the RIGHT upper lobe measures 10 mm (image 76/4)
compared to 18 mm on prior.

Nodule in the RIGHT lower lobe measures 6 mm (image 85/4) compared
to 18 mm on prior.

Most inferior RIGHT lower lobe 10 mm nodule (image 92/4) decreased
from 20 mm.

Additionally, there is near complete resolution of the mild to
moderate radiotracer activity previously associated with the
nodules. Only 2 small nodules have persistent radiotracer activity.
One nodule in the inferior RIGHT lower lobe with SUV max equal
decreased from 6.3. One nodule in the LEFT lower lobe with SUV max
equal 1.8 decreased from 4.9.

No new pulmonary nodules.

Incidental CT finding:Port in the anterior chest wall with tip in
distal SVC.

ABDOMEN/PELVIS:

No abnormal activity in liver. No activity in abdominopelvic lymph
nodes.

No radiotracer activity in the prostate bed. Post prostatectomy

Physiologic activity noted in the liver, spleen, adrenal glands and
kidneys.

Incidental CT findings:None

SKELETON: Again demonstrated sclerotic lesion within the RIGHT
sacrum without associated radiotracer activity. No new lesions
within the skeleton by CT scan or DOTATATE PET scan.
IMPRESSION: 1. Marked positive response to chemotherapy with reduction in size
of bilateral pulmonary nodules. All nodules are also reduced in
radiotracer activity with only 2 nodules remaining with associated
radiotracer activity (also decreased from comparison exam).
2. No evidence of metastatic disease outside of the thorax other
than the stable sclerotic lesion in the RIGHT sacrum.
3. No new or progressive disease.

## 2019-12-19 ENCOUNTER — Other Ambulatory Visit: Payer: Self-pay

## 2019-12-19 ENCOUNTER — Ambulatory Visit (HOSPITAL_BASED_OUTPATIENT_CLINIC_OR_DEPARTMENT_OTHER)
Admission: RE | Admit: 2019-12-19 | Discharge: 2019-12-19 | Disposition: A | Discharge status: Home / Self Care | Payer: Medicare Other | Source: Ambulatory Visit | Attending: Hematology & Oncology | Admitting: Hematology & Oncology

## 2019-12-19 DIAGNOSIS — G319 Degenerative disease of nervous system, unspecified: Secondary | ICD-10-CM | POA: Diagnosis not present

## 2019-12-19 DIAGNOSIS — C7A8 Other malignant neuroendocrine tumors: Secondary | ICD-10-CM | POA: Diagnosis not present

## 2019-12-19 DIAGNOSIS — R42 Dizziness and giddiness: Secondary | ICD-10-CM | POA: Diagnosis not present

## 2019-12-19 DIAGNOSIS — H748X2 Other specified disorders of left middle ear and mastoid: Secondary | ICD-10-CM | POA: Diagnosis not present

## 2019-12-19 DIAGNOSIS — I6782 Cerebral ischemia: Secondary | ICD-10-CM | POA: Diagnosis not present

## 2019-12-19 MED ORDER — GADOBUTROL 1 MMOL/ML IV SOLN
7.0000 mL | Freq: Once | INTRAVENOUS | Status: AC | PRN
  Administered 2019-12-19: 7 mL via INTRAVENOUS

## 2019-12-21 ENCOUNTER — Ambulatory Visit (HOSPITAL_COMMUNITY)
Admission: RE | Admit: 2019-12-21 | Discharge: 2019-12-21 | Disposition: A | Discharge status: Home / Self Care | Payer: Medicare Other | Source: Ambulatory Visit | Attending: Hematology & Oncology | Admitting: Hematology & Oncology

## 2019-12-21 ENCOUNTER — Encounter: Payer: Self-pay | Admitting: *Deleted

## 2019-12-21 ENCOUNTER — Other Ambulatory Visit: Payer: Self-pay

## 2019-12-21 DIAGNOSIS — C7800 Secondary malignant neoplasm of unspecified lung: Secondary | ICD-10-CM | POA: Diagnosis not present

## 2019-12-21 DIAGNOSIS — C7A8 Other malignant neuroendocrine tumors: Secondary | ICD-10-CM | POA: Diagnosis not present

## 2019-12-21 DIAGNOSIS — D3A8 Other benign neuroendocrine tumors: Secondary | ICD-10-CM | POA: Diagnosis not present

## 2019-12-21 MED ORDER — COPPER CU 64 DOTATATE 1 MCI/ML IV SOLN
4.0000 | Freq: Once | INTRAVENOUS | Status: AC
  Administered 2019-12-21: 4.14 via INTRAVENOUS

## 2020-01-05 ENCOUNTER — Other Ambulatory Visit: Payer: Medicare Other

## 2020-01-05 ENCOUNTER — Ambulatory Visit: Payer: Medicare Other | Admitting: Hematology & Oncology

## 2020-01-05 ENCOUNTER — Ambulatory Visit: Payer: Medicare Other

## 2020-01-06 ENCOUNTER — Inpatient Hospital Stay: Payer: Medicare Other

## 2020-01-06 ENCOUNTER — Inpatient Hospital Stay (HOSPITAL_BASED_OUTPATIENT_CLINIC_OR_DEPARTMENT_OTHER): Payer: Medicare Other | Admitting: Hematology & Oncology

## 2020-01-06 ENCOUNTER — Other Ambulatory Visit: Payer: Self-pay

## 2020-01-06 ENCOUNTER — Telehealth: Payer: Self-pay | Admitting: Hematology & Oncology

## 2020-01-06 ENCOUNTER — Encounter: Payer: Self-pay | Admitting: Hematology & Oncology

## 2020-01-06 VITALS — BP 113/71 | HR 96 | Temp 98.4°F | Resp 16 | Wt 150.0 lb

## 2020-01-06 DIAGNOSIS — C7A8 Other malignant neuroendocrine tumors: Secondary | ICD-10-CM | POA: Diagnosis not present

## 2020-01-06 DIAGNOSIS — C7A00 Malignant carcinoid tumor of unspecified site: Secondary | ICD-10-CM | POA: Diagnosis not present

## 2020-01-06 DIAGNOSIS — C61 Malignant neoplasm of prostate: Secondary | ICD-10-CM | POA: Diagnosis not present

## 2020-01-06 DIAGNOSIS — Z5111 Encounter for antineoplastic chemotherapy: Secondary | ICD-10-CM | POA: Diagnosis not present

## 2020-01-06 DIAGNOSIS — C7951 Secondary malignant neoplasm of bone: Secondary | ICD-10-CM

## 2020-01-06 DIAGNOSIS — C7B8 Other secondary neuroendocrine tumors: Secondary | ICD-10-CM | POA: Diagnosis not present

## 2020-01-06 DIAGNOSIS — D509 Iron deficiency anemia, unspecified: Secondary | ICD-10-CM | POA: Diagnosis not present

## 2020-01-06 LAB — CMP (CANCER CENTER ONLY)
ALT: 11 U/L (ref 0–44)
AST: 16 U/L (ref 15–41)
Albumin: 4 g/dL (ref 3.5–5.0)
Alkaline Phosphatase: 50 U/L (ref 38–126)
Anion gap: 9 (ref 5–15)
BUN: 14 mg/dL (ref 8–23)
CO2: 24 mmol/L (ref 22–32)
Calcium: 9.3 mg/dL (ref 8.9–10.3)
Chloride: 103 mmol/L (ref 98–111)
Creatinine: 1.36 mg/dL — ABNORMAL HIGH (ref 0.61–1.24)
GFR, Estimated: 56 mL/min — ABNORMAL LOW (ref >60)
Glucose, Bld: 115 mg/dL — ABNORMAL HIGH (ref 70–99)
Potassium: 3.9 mmol/L (ref 3.5–5.1)
Sodium: 136 mmol/L (ref 135–145)
Total Bilirubin: 0.2 mg/dL — ABNORMAL LOW (ref 0.3–1.2)
Total Protein: 6.5 g/dL (ref 6.5–8.1)

## 2020-01-06 LAB — CBC WITH DIFFERENTIAL (CANCER CENTER ONLY)
Abs Immature Granulocytes: 0.03 10*3/uL (ref 0.00–0.07)
Basophils Absolute: 0 10*3/uL (ref 0.0–0.1)
Basophils Relative: 1 %
Eosinophils Absolute: 0.4 10*3/uL (ref 0.0–0.5)
Eosinophils Relative: 8 %
HCT: 29.7 % — ABNORMAL LOW (ref 39.0–52.0)
Hemoglobin: 9.4 g/dL — ABNORMAL LOW (ref 13.0–17.0)
Immature Granulocytes: 1 %
Lymphocytes Relative: 18 %
Lymphs Abs: 0.8 10*3/uL (ref 0.7–4.0)
MCH: 32.2 pg (ref 26.0–34.0)
MCHC: 31.6 g/dL (ref 30.0–36.0)
MCV: 101.7 fL — ABNORMAL HIGH (ref 80.0–100.0)
Monocytes Absolute: 0.9 10*3/uL (ref 0.1–1.0)
Monocytes Relative: 19 %
Neutro Abs: 2.5 10*3/uL (ref 1.7–7.7)
Neutrophils Relative %: 53 %
Platelet Count: 308 10*3/uL (ref 150–400)
RBC: 2.92 MIL/uL — ABNORMAL LOW (ref 4.22–5.81)
RDW: 14.6 % (ref 11.5–15.5)
WBC Count: 4.6 10*3/uL (ref 4.0–10.5)
nRBC: 0 % (ref 0.0–0.2)

## 2020-01-06 LAB — IRON AND TIBC
Iron: 61 ug/dL (ref 42–163)
Saturation Ratios: 26 % (ref 20–55)
TIBC: 236 ug/dL (ref 202–409)
UIBC: 175 ug/dL (ref 117–376)

## 2020-01-06 LAB — SAMPLE TO BLOOD BANK

## 2020-01-06 LAB — LACTATE DEHYDROGENASE: LDH: 158 U/L (ref 98–192)

## 2020-01-06 LAB — FERRITIN: Ferritin: 662 ng/mL — ABNORMAL HIGH (ref 24–336)

## 2020-01-06 MED ORDER — SODIUM CHLORIDE 0.9 % IV SOLN
Freq: Once | INTRAVENOUS | Status: AC
  Filled 2020-01-06: qty 250

## 2020-01-06 MED ORDER — PALONOSETRON HCL INJECTION 0.25 MG/5ML
INTRAVENOUS | Status: AC
  Filled 2020-01-06: qty 5

## 2020-01-06 MED ORDER — HEPARIN SOD (PORK) LOCK FLUSH 100 UNIT/ML IV SOLN
500.0000 [IU] | Freq: Once | INTRAVENOUS | Status: AC | PRN
  Administered 2020-01-06: 12:00:00 500 [IU]
  Filled 2020-01-06: qty 5

## 2020-01-06 MED ORDER — SODIUM CHLORIDE 0.9% FLUSH
10.0000 mL | INTRAVENOUS | Status: DC | PRN
  Administered 2020-01-06: 12:00:00 10 mL
  Filled 2020-01-06: qty 10

## 2020-01-06 MED ORDER — LURBINECTEDIN CHEMO IV INJECTION 4 MG
3.2000 mg/m2 | Freq: Once | INTRAVENOUS | Status: AC
  Administered 2020-01-06: 11:00:00 5.8 mg via INTRAVENOUS
  Filled 2020-01-06: qty 11.6

## 2020-01-06 MED ORDER — PALONOSETRON HCL INJECTION 0.25 MG/5ML
0.2500 mg | Freq: Once | INTRAVENOUS | Status: AC
  Administered 2020-01-06: 10:00:00 0.25 mg via INTRAVENOUS

## 2020-01-06 MED ORDER — SODIUM CHLORIDE 0.9 % IV SOLN
10.0000 mg | Freq: Once | INTRAVENOUS | Status: AC
  Administered 2020-01-06: 10:00:00 10 mg via INTRAVENOUS
  Filled 2020-01-06: qty 10

## 2020-01-06 MED FILL — PANTOPRAZOLE SOD DR 40 MG T: 40 | 30 days supply | Qty: 30 | Fill #5

## 2020-01-06 MED FILL — ISOSORBIDE MN ER 30 MG TAB: 30 | 90 days supply | Qty: 90 | Fill #2

## 2020-01-06 MED FILL — GABAPENTIN 300 MG CAPSULE: 300 | 30 days supply | Qty: 120 | Fill #1

## 2020-01-06 NOTE — Progress Notes (Signed)
Pt discharged in no apparent distress. Pt left ambulatory with walker. Pt aware of discharge instructions and verbalized understanding and had no further questions. Stable and asymptomatic upon discharge.

## 2020-01-06 NOTE — Telephone Encounter (Signed)
Appointments scheduled calendar printed per 12/22 los

## 2020-01-06 NOTE — Progress Notes (Signed)
Hematology and Oncology Follow Up Visit  [name] [id number] 05-21-1945 74 y.o. 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 Iron def anemia -- malabsorption  Past Therapy: Zytiga 1000 mg by mouth daily - discontinued on 08/15/2016 Xtandi 160 mg po q day - start on 08/15/2016 - discontinued Radium-223 therapy -- s/p cycle #6 Palliative radiation to the right sacrum Lutathera - s/p cycle 4/4 --last dose on 07/11/2017 Carbo/VP-16/Tecentriq --  S/p cycle #5 Keytruda q 6 week -- Maintanence -- s/p cycle #3 - changed from 3 to 6 week on 06/30/2018 Taxotere/Keytruda -- start on 09/17/2018 -- s/p cycle #10 -- d/c due to progression  Current Therapy:        Lurbinectedin -- started on 04/29/2019, s/p cycle #12 Xgeva 120 mg subcutaneous Q3 months - due in 01/2020 Lupron 22 mg IM every 3 months - due in 01/2020  IV iron as indicated    Interim History:  [name] is here today for follow-up.  He looks fantastic.  He feels good.  He is looking forward to Christmas.  He will have a couple Christmas celebrations with family.  We did do a nuclear medicine scan on him for his neuroendocrine tumor.  Thankfully, everything looks quite stable.  There really is no evidence of new disease.  I am thankful for this.   His last chromogranin A level was up to 900.  This is somewhat going to have to watch closely.  He has had no problems with nausea or vomiting.  Is had no further issues with vertigo.  We did do an MRI of his brain back in early December.  This did not show anything with the brain that would suggest an etiology.  He has had no problems with the right leg.  Has a brace on the right leg.  Has some foot drop.  He has had no problems with his bowels or bladder.  There is no hematuria.  There is no diarrhea.  He has had no fever.  There is no rashes.  Overall, his performance  status is ECOG 1.    Medications:  Allergies as of 01/06/2020      Reactions   Codeine Nausea And Vomiting   Hydrocodone Nausea And Vomiting      Medication List       Accurate as of January 06, 2020 10:09 AM. If you have any questions, ask your nurse or doctor.        aspirin 81 MG tablet Take 81 mg by mouth daily.   CALCIUM-IRON-VIT D-VIT K PO Take 1 tablet by mouth 2 (two) times daily.   dexamethasone 4 MG tablet Commonly known as: DECADRON TAKE 2 TABLETS (8 MG TOTAL) BY MOUTH 2 (TWO) TIMES DAILY FOR 5 DAYS. START 2 DAYS BEFORE EACH CHEMOTHERAPY. What changed: See the new instructions.   gabapentin 300 MG capsule Commonly known as: NEURONTIN TAKE 1 CAPSULE BY MOUTH TWICE A DAY THEN TAKE 2 CAPSULES AT BEDTIME   Generlac 10 GM/15ML Soln Generic drug: lactulose (encephalopathy) SMARTSIG:45 Milliliter(s) By Mouth Daily PRN   guaiFENesin 600 MG 12 hr tablet Commonly known as: MUCINEX Take 400 mg by mouth 2 (two) times daily.   isosorbide mononitrate 30 MG 24 hr tablet Commonly known as: IMDUR Take 30 mg by mouth daily.   lidocaine-prilocaine cream Commonly known as: EMLA   nitroGLYCERIN 0.4 MG SL tablet Commonly known as: NITROSTAT Place 1 tablet (  0.4 mg total) under the tongue every 5 (five) minutes as needed for chest pain.   ondansetron 8 MG tablet Commonly known as: ZOFRAN Take 1 tablet (8 mg total) by mouth 2 (two) times daily as needed for nausea or vomiting.   OVER THE COUNTER MEDICATION every morning. Juice Plus -- 8 capsule of each the garden, vineyard, and orchard twice a day.   Oyster Shell Calcium/D 250-125 MG-UNIT Tabs Take by mouth daily.   pantoprazole 40 MG tablet Commonly known as: PROTONIX TAKE 1 TABLET (40 MG TOTAL) BY MOUTH AT BEDTIME.   prochlorperazine 10 MG tablet Commonly known as: COMPAZINE TAKE 1 TABLET (10 MG TOTAL) BY MOUTH EVERY 6 (SIX) HOURS AS NEEDED (NAUSEA OR VOMITING).   senna-docusate 8.6-50 MG tablet Commonly  known as: Senokot-S Take 2-4 tablets by mouth at bedtime.   sucralfate 1 GM/10ML suspension Commonly known as: Carafate Take 10 mLs (1 g total) by mouth 4 (four) times daily -  with meals and at bedtime. What changed:   when to take this  reasons to take this   Zetia 10 MG tablet Generic drug: ezetimibe Take 10 mg by mouth daily.   Zinc 50 MG Caps Take 50 mg by mouth daily.       Allergies:  Allergies  Allergen Reactions  . Codeine Nausea And Vomiting  . Hydrocodone Nausea And Vomiting    Past Medical History, Surgical history, Social history, and Family History were reviewed and updated.  Review of Systems: Review of Systems  Constitutional: Negative.   HENT: Negative.   Eyes: Negative.   Respiratory: Negative.   Cardiovascular: Negative.   Gastrointestinal: Negative.   Genitourinary: Negative.   Musculoskeletal: Negative.   Skin: Negative.   Neurological: Negative.   Endo/Heme/Allergies: Negative.   Psychiatric/Behavioral: Negative.      Physical Exam:  weight is 150 lb (68 kg). His oral temperature is 98.4 F (36.9 C). His blood pressure is 113/71 and his pulse is 96. His respiration is 16 and oxygen saturation is 100%.   Wt Readings from Last 3 Encounters:  01/06/20 150 lb (68 kg)  12/16/19 151 lb (68.5 kg)  11/26/19 147 lb (66.7 kg)    Physical Exam Vitals reviewed.  HENT:     Head: Normocephalic and atraumatic.  Eyes:     Pupils: Pupils are equal, round, and reactive to light.  Cardiovascular:     Rate and Rhythm: Normal rate and regular rhythm.     Heart sounds: Normal heart sounds.  Pulmonary:     Effort: Pulmonary effort is normal.     Breath sounds: Normal breath sounds.  Abdominal:     General: Bowel sounds are normal.     Palpations: Abdomen is soft.  Musculoskeletal:        General: No tenderness or deformity. Normal range of motion.     Cervical back: Normal range of motion.     Comments: Extremities shows the brace on the  right lower leg.  There is a little bit of muscle atrophy.  He has decent strength in both legs.  He has decent range of motion of his joints.  Pulses are intact in the lower extremities.  Lymphadenopathy:     Cervical: No cervical adenopathy.  Skin:    General: Skin is warm and dry.     Findings: No erythema or rash.  Neurological:     Mental Status: He is alert and oriented to person, place, and time.  Psychiatric:  Behavior: Behavior normal.        Thought Content: Thought content normal.        Judgment: Judgment normal.      Lab Results  Component Value Date   WBC 4.6 01/06/2020   HGB 9.4 (L) 01/06/2020   HCT 29.7 (L) 01/06/2020   MCV 101.7 (H) 01/06/2020   PLT 308 01/06/2020   Lab Results  Component Value Date   FERRITIN 519 (H) 12/16/2019   IRON 63 12/16/2019   TIBC 249 12/16/2019   UIBC 185 12/16/2019   IRONPCTSAT 25 12/16/2019   Lab Results  Component Value Date   RETICCTPCT 1.6 04/08/2019   RBC 2.92 (L) 01/06/2020   No results found for: KPAFRELGTCHN, LAMBDASER, KAPLAMBRATIO No results found for: IGGSERUM, IGA, IGMSERUM No results found for: Kathrynn Ducking, MSPIKE, SPEI   Chemistry      Component Value Date/Time   NA 136 01/06/2020 0805   NA 144 12/14/2016 1159   NA 140 10/22/2016 1129   K 3.9 01/06/2020 0805   K 3.5 12/14/2016 1159   K 4.3 10/22/2016 1129   CL 103 01/06/2020 0805   CL 103 12/14/2016 1159   CO2 24 01/06/2020 0805   CO2 30 12/14/2016 1159   CO2 27 10/22/2016 1129   BUN 14 01/06/2020 0805   BUN 16 12/14/2016 1159   BUN 14.0 10/22/2016 1129   CREATININE 1.36 (H) 01/06/2020 0805   CREATININE 1.3 (H) 12/14/2016 1159   CREATININE 1.1 10/22/2016 1129      Component Value Date/Time   CALCIUM 9.3 01/06/2020 0805   CALCIUM 10.0 12/14/2016 1159   CALCIUM 10.4 10/22/2016 1129   ALKPHOS 50 01/06/2020 0805   ALKPHOS 48 12/14/2016 1159   ALKPHOS 60 10/22/2016 1129   AST 16 01/06/2020 0805    AST 16 10/22/2016 1129   ALT 11 01/06/2020 0805   ALT 23 12/14/2016 1159   ALT 14 10/22/2016 1129   BILITOT 0.2 (L) 01/06/2020 0805   BILITOT 0.31 10/22/2016 1129       Impression and Plan: [name] is 74 yo caucasian gentleman with metastatic neuroendocrine carcinoma of unknown  primary.  For right now, we will continue him on the Lurbinectidin.  This seems to be working for him.  He is tolerating it well.  Again, quality of life is what we are focusing on.  He has a good quality of life right now and I am happy about this.  We will plan to get him back in 3 weeks.    [name], MD 12/22/202110:09 AM

## 2020-01-06 NOTE — Patient Instructions (Signed)
Lurbinectedin Injection What is this medicine? LURBINECTEDIN (LOOR bin EK te din) is a chemotherapy drug. This medicine is used to treat lung cancer. This medicine may be used for other purposes; ask your health care provider or pharmacist if you have questions. COMMON BRAND NAME(S): ZEPZELCA What should I tell my health care provider before I take this medicine? They need to know if you have any of these conditions:  infection (especially a viral infection such as chickenpox, cold sores, or herpes)  liver disease  low blood counts, like white cells, platelets, or red blood cells  an unusual or allergic reaction to lurbinectedin, other medicines, foods, dyes or preservatives  pregnant or trying to get pregnant  breast-feeding How should I use this medicine? This medicine is for infusion into a vein. It is given by a healthcare professional in a hospital or clinic setting. Talk to your pediatrician about the use of this medicine in children. Special care may be needed. Overdosage: If you think you have taken too much of this medicine contact a poison control center or emergency room at once. NOTE: This medicine is only for you. Do not share this medicine with others. What if I miss a dose? Keep appointments for follow-up doses. It is important not to miss your dose. Call your doctor or health care professional if you are unable to keep an appointment. What may interact with this medicine?  certain antibiotics like erythromycin or clarithromycin  certain antivirals for HIV or hepatitis  certain medicines for fungal infections like ketoconazole, itraconazole, or posaconazole  certain medicines for seizures like carbamazepine, phenobarbital, phenytoin  grapefruit or grapefruit juice  St. John's Wort This list may not describe all possible interactions. Give your health care provider a list of all the medicines, herbs, non-prescription drugs, or dietary supplements you use. Also  tell them if you smoke, drink alcohol, or use illegal drugs. Some items may interact with your medicine. What should I watch for while using this medicine? Your condition will be monitored carefully while you are receiving this medicine. Do not become pregnant while taking this medicine or for 6 months after stopping it. Women should inform their health care professional if they wish to become pregnant or think they might be pregnant. Men should not father a child while taking this medicine and for 4 months after stopping it. There is potential for serious side effects to an unborn child. Talk to your health care professional for more information. Do not breast-feed a child while taking this medicine or for 2 weeks after stopping it. Call your health care professional for advice if you get a fever, chills, or sore throat, or other symptoms of a cold or flu. Do not treat yourself. This medicine decreases your body's ability to fight infections. Try to avoid being around people who are sick. Avoid taking medicines that contain aspirin, acetaminophen, ibuprofen, naproxen, or ketoprofen unless instructed by your health care professional. These medicines may hide a fever. Be careful brushing or flossing your teeth or using a toothpick because you may get an infection or bleed more easily. If you have any dental work done, tell your dentist you are receiving this medicine. What side effects may I notice from receiving this medicine? Side effects that you should report to your doctor or health care professional as soon as possible:  allergic reactions like skin rash, itching or hives; swelling of the face, lips, or tongue  chest pain  nausea, vomiting  signs and symptoms of  bleeding such as bloody or black, tarry stools; red or dark-brown urine; spitting up blood or brown material that looks like coffee grounds; red spots on the skin; unusual bruising or bleeding from the eyes, gums, or nose  signs and  symptoms of infection like fever; chills; cough; sore throat; pain or trouble passing urine  signs and symptoms of liver injury like dark yellow or brown urine; general ill feeling or flu-like symptoms; light-colored stools; loss of appetite; nausea; right upper belly pain; unusually weak or tired; yellowing of the eyes or skin Side effects that usually do not require medical attention (report these to your doctor or health care professional if they continue or are bothersome):  changes in taste  constipation  diarrhea  loss of appetite  muscle pain  pain, tingling, numbness in the hands or feet  signs and symptoms of high blood sugar such as being more thirsty or hungry or having to urinate more than normal. You may also feel very tired or have blurry vision.  signs and symptoms of low magnesium like muscle cramps; muscle pain; muscle weakness; tremors; seizures; or fast, irregular heartbeat  signs and symptoms of low red blood cells or anemia such as unusually weak or tired; feeling faint or lightheaded; falls; breathing problems  stomach pain This list may not describe all possible side effects. Call your doctor for medical advice about side effects. You may report side effects to FDA at 1-800-FDA-1088. Where should I keep my medicine? This medicine is given in a hospital or clinic and will not be stored at home. NOTE: This sheet is a summary. It may not cover all possible information. If you have questions about this medicine, talk to your doctor, pharmacist, or health care provider.  2020 Elsevier/Gold Standard (2018-07-03 13:00:33)

## 2020-01-06 NOTE — Patient Instructions (Signed)

## 2020-01-07 LAB — CHROMOGRANIN A: Chromogranin A (ng/mL): 797.4 ng/mL — ABNORMAL HIGH (ref 0.0–101.8)

## 2020-01-25 DIAGNOSIS — R112 Nausea with vomiting, unspecified: Secondary | ICD-10-CM | POA: Insufficient documentation

## 2020-01-25 DIAGNOSIS — R739 Hyperglycemia, unspecified: Secondary | ICD-10-CM | POA: Insufficient documentation

## 2020-01-25 DIAGNOSIS — E119 Type 2 diabetes mellitus without complications: Secondary | ICD-10-CM | POA: Insufficient documentation

## 2020-01-25 DIAGNOSIS — R0600 Dyspnea, unspecified: Secondary | ICD-10-CM | POA: Insufficient documentation

## 2020-01-25 DIAGNOSIS — K5900 Constipation, unspecified: Secondary | ICD-10-CM | POA: Insufficient documentation

## 2020-01-25 DIAGNOSIS — T8859XA Other complications of anesthesia, initial encounter: Secondary | ICD-10-CM | POA: Insufficient documentation

## 2020-01-25 DIAGNOSIS — Z9889 Other specified postprocedural states: Secondary | ICD-10-CM | POA: Insufficient documentation

## 2020-01-27 ENCOUNTER — Telehealth: Payer: Self-pay | Admitting: Family

## 2020-01-27 ENCOUNTER — Inpatient Hospital Stay: Payer: Medicare Other

## 2020-01-27 ENCOUNTER — Encounter: Payer: Self-pay | Admitting: Cardiology

## 2020-01-27 ENCOUNTER — Inpatient Hospital Stay (HOSPITAL_BASED_OUTPATIENT_CLINIC_OR_DEPARTMENT_OTHER): Payer: Medicare Other | Admitting: Family

## 2020-01-27 ENCOUNTER — Ambulatory Visit (INDEPENDENT_AMBULATORY_CARE_PROVIDER_SITE_OTHER): Payer: Medicare Other | Admitting: Cardiology

## 2020-01-27 ENCOUNTER — Encounter: Payer: Self-pay | Admitting: Family

## 2020-01-27 ENCOUNTER — Other Ambulatory Visit: Payer: Self-pay

## 2020-01-27 ENCOUNTER — Inpatient Hospital Stay: Payer: Medicare Other | Attending: Hematology & Oncology

## 2020-01-27 VITALS — BP 123/75 | HR 91 | Temp 97.9°F | Resp 16 | Wt 153.1 lb

## 2020-01-27 VITALS — BP 128/58 | HR 101 | Ht 68.0 in | Wt 154.0 lb

## 2020-01-27 DIAGNOSIS — C7951 Secondary malignant neoplasm of bone: Secondary | ICD-10-CM | POA: Diagnosis not present

## 2020-01-27 DIAGNOSIS — D500 Iron deficiency anemia secondary to blood loss (chronic): Secondary | ICD-10-CM | POA: Diagnosis not present

## 2020-01-27 DIAGNOSIS — Z5111 Encounter for antineoplastic chemotherapy: Secondary | ICD-10-CM | POA: Diagnosis not present

## 2020-01-27 DIAGNOSIS — D508 Other iron deficiency anemias: Secondary | ICD-10-CM | POA: Insufficient documentation

## 2020-01-27 DIAGNOSIS — I10 Essential (primary) hypertension: Secondary | ICD-10-CM

## 2020-01-27 DIAGNOSIS — C61 Malignant neoplasm of prostate: Secondary | ICD-10-CM | POA: Insufficient documentation

## 2020-01-27 DIAGNOSIS — R0602 Shortness of breath: Secondary | ICD-10-CM

## 2020-01-27 DIAGNOSIS — C7B8 Other secondary neuroendocrine tumors: Secondary | ICD-10-CM | POA: Insufficient documentation

## 2020-01-27 DIAGNOSIS — R5383 Other fatigue: Secondary | ICD-10-CM | POA: Insufficient documentation

## 2020-01-27 DIAGNOSIS — K5900 Constipation, unspecified: Secondary | ICD-10-CM | POA: Diagnosis not present

## 2020-01-27 DIAGNOSIS — K909 Intestinal malabsorption, unspecified: Secondary | ICD-10-CM | POA: Diagnosis not present

## 2020-01-27 DIAGNOSIS — C7A8 Other malignant neuroendocrine tumors: Secondary | ICD-10-CM | POA: Diagnosis not present

## 2020-01-27 DIAGNOSIS — Z79899 Other long term (current) drug therapy: Secondary | ICD-10-CM | POA: Insufficient documentation

## 2020-01-27 LAB — CBC WITH DIFFERENTIAL (CANCER CENTER ONLY)
Abs Immature Granulocytes: 0.05 10*3/uL (ref 0.00–0.07)
Basophils Absolute: 0 10*3/uL (ref 0.0–0.1)
Basophils Relative: 1 %
Eosinophils Absolute: 0.3 10*3/uL (ref 0.0–0.5)
Eosinophils Relative: 5 %
HCT: 29.1 % — ABNORMAL LOW (ref 39.0–52.0)
Hemoglobin: 9.5 g/dL — ABNORMAL LOW (ref 13.0–17.0)
Immature Granulocytes: 1 %
Lymphocytes Relative: 14 %
Lymphs Abs: 0.8 10*3/uL (ref 0.7–4.0)
MCH: 32.9 pg (ref 26.0–34.0)
MCHC: 32.6 g/dL (ref 30.0–36.0)
MCV: 100.7 fL — ABNORMAL HIGH (ref 80.0–100.0)
Monocytes Absolute: 1 10*3/uL (ref 0.1–1.0)
Monocytes Relative: 17 %
Neutro Abs: 3.5 10*3/uL (ref 1.7–7.7)
Neutrophils Relative %: 62 %
Platelet Count: 346 10*3/uL (ref 150–400)
RBC: 2.89 MIL/uL — ABNORMAL LOW (ref 4.22–5.81)
RDW: 15.1 % (ref 11.5–15.5)
WBC Count: 5.6 10*3/uL (ref 4.0–10.5)
nRBC: 0 % (ref 0.0–0.2)

## 2020-01-27 LAB — CMP (CANCER CENTER ONLY)
ALT: 13 U/L (ref 0–44)
AST: 14 U/L — ABNORMAL LOW (ref 15–41)
Albumin: 4 g/dL (ref 3.5–5.0)
Alkaline Phosphatase: 56 U/L (ref 38–126)
Anion gap: 7 (ref 5–15)
BUN: 16 mg/dL (ref 8–23)
CO2: 26 mmol/L (ref 22–32)
Calcium: 9.1 mg/dL (ref 8.9–10.3)
Chloride: 104 mmol/L (ref 98–111)
Creatinine: 1.29 mg/dL — ABNORMAL HIGH (ref 0.61–1.24)
GFR, Estimated: 59 mL/min — ABNORMAL LOW (ref >60)
Glucose, Bld: 147 mg/dL — ABNORMAL HIGH (ref 70–99)
Potassium: 3.9 mmol/L (ref 3.5–5.1)
Sodium: 137 mmol/L (ref 135–145)
Total Bilirubin: 0.2 mg/dL — ABNORMAL LOW (ref 0.3–1.2)
Total Protein: 6 g/dL — ABNORMAL LOW (ref 6.5–8.1)

## 2020-01-27 LAB — LACTATE DEHYDROGENASE: LDH: 150 U/L (ref 98–192)

## 2020-01-27 MED ORDER — DENOSUMAB 120 MG/1.7ML ~~LOC~~ SOLN
SUBCUTANEOUS | Status: AC
  Filled 2020-01-27: qty 1.7

## 2020-01-27 MED ORDER — SODIUM CHLORIDE 0.9% FLUSH
10.0000 mL | INTRAVENOUS | Status: DC | PRN
  Administered 2020-01-27: 10 mL
  Filled 2020-01-27: qty 10

## 2020-01-27 MED ORDER — SODIUM CHLORIDE 0.9% FLUSH
3.0000 mL | INTRAVENOUS | Status: DC | PRN
  Filled 2020-01-27: qty 10

## 2020-01-27 MED ORDER — LEUPROLIDE ACETATE (3 MONTH) 22.5 MG ~~LOC~~ KIT
22.5000 mg | PACK | Freq: Once | SUBCUTANEOUS | Status: AC
  Administered 2020-01-27: 22.5 mg via SUBCUTANEOUS
  Filled 2020-01-27: qty 22.5

## 2020-01-27 MED ORDER — PALONOSETRON HCL INJECTION 0.25 MG/5ML
INTRAVENOUS | Status: AC
  Filled 2020-01-27: qty 5

## 2020-01-27 MED ORDER — PALONOSETRON HCL INJECTION 0.25 MG/5ML
0.2500 mg | Freq: Once | INTRAVENOUS | Status: AC
  Administered 2020-01-27: 0.25 mg via INTRAVENOUS

## 2020-01-27 MED ORDER — SODIUM CHLORIDE 0.9 % IV SOLN
3.2000 mg/m2 | Freq: Once | INTRAVENOUS | Status: AC
  Administered 2020-01-27: 5.8 mg via INTRAVENOUS
  Filled 2020-01-27: qty 11.6

## 2020-01-27 MED ORDER — DENOSUMAB 120 MG/1.7ML ~~LOC~~ SOLN
120.0000 mg | Freq: Once | SUBCUTANEOUS | Status: AC
  Administered 2020-01-27: 120 mg via SUBCUTANEOUS

## 2020-01-27 MED ORDER — SODIUM CHLORIDE 0.9 % IV SOLN
Freq: Once | INTRAVENOUS | Status: AC
  Filled 2020-01-27: qty 250

## 2020-01-27 MED ORDER — SODIUM CHLORIDE 0.9 % IV SOLN
10.0000 mg | Freq: Once | INTRAVENOUS | Status: AC
  Administered 2020-01-27: 10 mg via INTRAVENOUS
  Filled 2020-01-27: qty 10

## 2020-01-27 MED ORDER — HEPARIN SOD (PORK) LOCK FLUSH 100 UNIT/ML IV SOLN
500.0000 [IU] | Freq: Once | INTRAVENOUS | Status: AC | PRN
  Administered 2020-01-27: 500 [IU]
  Filled 2020-01-27: qty 5

## 2020-01-27 NOTE — Patient Instructions (Signed)
Medication Instructions:  Your physician recommends that you continue on your current medications as directed. Please refer to the Current Medication list given to you today.  *If you need a refill on your cardiac medications before your next appointment, please call your pharmacy*  Lab Work: None ordered today  Testing/Procedures: Your physician has requested that you have an echocardiogram. Echocardiography is a painless test that uses sound waves to create images of your heart. It provides your doctor with information about the size and shape of your heart and how well your heart's chambers and valves are working. This procedure takes approximately one hour. There are no restrictions for this procedure.  Follow-Up: At [facility], you and your health needs are our priority.  As part of our continuing mission to provide you with exceptional heart care, we have created designated Provider Care Teams.  These Care Teams include your primary Cardiologist (physician) and Advanced Practice Providers (APPs -  Physician Assistants and Nurse Practitioners) who all work together to provide you with the care you need, when you need it.  Your next appointment:   6 month(s)  The format for your next appointment:   In Person  Provider:   Jenne Campus, MD

## 2020-01-27 NOTE — Progress Notes (Unsigned)
Cardiology Office Note:    Date:  01/27/2020   ID:  [name], DOB July 16, 1945, MRN [id number]  PCP:  Nicoletta Dress, MD  Cardiologist:  Jenne Campus, MD    Referring MD: Nicoletta Dress, MD   Chief Complaint  Patient presents with  . Follow-up  I am doing fine  History of Present Illness:    [name] is a 75 y.o. 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  Comes today to my office after not being seen for more than a year.  He is doing quite well he ate somewhat but tells me that he can still function he does work at home and taking activity of daily living and helping his wife in household.  He retired last May.  Described to have some shortness of breath interestingly only when he is getting constipated.  No chest pain tightness squeezing pressure burning chest.  He complained of having pain in his right leg which is chronic problem that prevents him from walking too much.  Sometimes he walks with his dog with a walker.  Past Medical History:  Diagnosis Date  . Cancer, metastatic to bone ([place]) 08/24/2015  . Complication of anesthesia   . Constipation    due to pain medication  . 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 Dyslipidemia 07/12/2014   Overview:  Intolerant to multiple statins  Formatting of this note might be different from the original. Intolerant to multiple statins  . Dyspnea    with exertion  . Essential hypertension 07/12/2014  . Goals of care, counseling/discussion 01/29/2018  . High blood sugar   . Iron deficiency anemia due to chronic blood loss 02/04/2019  . Ischemic chest pain ([place]) 07/12/2014   Formatting of this note might be different from the original. Minimally abnormal stress test but does not want to do cardiac catheterization, asymptomatic.  . Metastasis to bone  ([place]) 09/12/2016  . 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 Pain of right posterior thigh 02/15/2014  . PONV (postoperative nausea and vomiting)   . 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 Radiation 04/22/14-05/06/14   right sacrum 30 gray  . 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 Type 2 diabetes mellitus ([place]) 03/23/2018    Past Surgical History:  Procedure Laterality Date  . CIRCUMCISION     when patient was 75 years old  . COLONOSCOPY    . COLONOSCOPY W/ POLYPECTOMY    . ESOPHAGOGASTRODUODENOSCOPY    . HAND SURGERY Left    fracture  . IR IMAGING GUIDED PORT INSERTION  02/10/2018  . LUMBAR LAMINECTOMY/DECOMPRESSION MICRODISCECTOMY Right 09/12/2016   Procedure: LAMINECTOMY AND FORAMINOTOMY RIGHT LUMBAR FIVE- SACRAL ONE, SACRAL ONE- SACRAL TWO DECOMPRESSION OF RIGHT SACRAL ONE NERVE ROOT;  Surgeon: [name], MD;  Location: [facility];  Service: Neurosurgery;  Laterality: Right;  . ROBOT ASSISTED LAPAROSCOPIC RADICAL PROSTATECTOMY    . TONSILECTOMY/ADENOIDECTOMY WITH MYRINGOTOMY      Current Medications: Current Meds  Medication Sig  . aspirin 81 MG tablet Take 81 mg by mouth daily.  . Calcium Carbonate-Vitamin D (OYSTER SHELL CALCIUM/D) 250-125 MG-UNIT TABS Take by mouth daily.   Marland Kitchen CALCIUM-IRON-VIT D-VIT K PO Take 1 tablet by mouth 2 (two) times  daily.  . dexamethasone (DECADRON) 4 MG tablet TAKE 2 TABLETS (8 MG TOTAL) BY MOUTH 2 (TWO) TIMES DAILY FOR 5 DAYS. START 2 DAYS BEFORE EACH CHEMOTHERAPY. (Patient taking differently: 12/16/2019 Takes 2 tablets BID x 3 days, starting the day of chemo.)  . gabapentin (NEURONTIN) 300 MG capsule TAKE 1 CAPSULE BY MOUTH TWICE A DAY THEN TAKE 2 CAPSULES AT BEDTIME  . GENERLAC 10 GM/15ML SOLN SMARTSIG:45 Milliliter(s) By Mouth Daily PRN  . guaiFENesin (MUCINEX) 600 MG 12 hr tablet Take 400 mg by mouth 2 (two) times  daily.  . isosorbide mononitrate (IMDUR) 30 MG 24 hr tablet Take 30 mg by mouth daily.  Marland Kitchen lidocaine-prilocaine (EMLA) cream   . nitroGLYCERIN (NITROSTAT) 0.4 MG SL tablet Place 1 tablet (0.4 mg total) under the tongue every 5 (five) minutes as needed for chest pain.  Marland Kitchen ondansetron (ZOFRAN) 8 MG tablet Take 1 tablet (8 mg total) by mouth 2 (two) times daily as needed for nausea or vomiting.  Marland Kitchen OVER THE COUNTER MEDICATION every morning. Juice Plus -- 8 capsule of each the garden, vineyard, and orchard twice a day.  . pantoprazole (PROTONIX) 40 MG tablet TAKE 1 TABLET (40 MG TOTAL) BY MOUTH AT BEDTIME.  Marland Kitchen prochlorperazine (COMPAZINE) 10 MG tablet TAKE 1 TABLET (10 MG TOTAL) BY MOUTH EVERY 6 (SIX) HOURS AS NEEDED (NAUSEA OR VOMITING).  Marland Kitchen senna-docusate (SENOKOT-S) 8.6-50 MG tablet Take 2-4 tablets by mouth at bedtime.  . sucralfate (CARAFATE) 1 GM/10ML suspension Take 10 mLs (1 g total) by mouth 4 (four) times daily -  with meals and at bedtime. (Patient taking differently: Take 1 g by mouth 3 (three) times daily as needed.)  . ZETIA 10 MG tablet Take 10 mg by mouth daily.   . Zinc 50 MG CAPS Take 50 mg by mouth daily.     Allergies:   Codeine and Hydrocodone   Social History   Socioeconomic History  . Marital status: Married    Spouse name: Not on file  . Number of children: 2  . Years of education: Not on file  . Highest education level: Not on file  Occupational History  . Occupation: real estate aprais.    Employer: [employer]  Tobacco Use  . Smoking status: Never Smoker  . Smokeless tobacco: Never Used  . Tobacco comment: never used tobacco  Vaping Use  . Vaping Use: Never used  Substance and Sexual Activity  . Alcohol use: No    Alcohol/week: 0.0 standard drinks  . Drug use: No  . Sexual activity: Not on file  Other Topics Concern  . Not on file  Social History Narrative  . Not on file   Social Determinants of Health   Financial Resource Strain: Not on file   Food Insecurity: Not on file  Transportation Needs: Not on file  Physical Activity: Not on file  Stress: Not on file  Social Connections: Not on file     Family History: The patient's family history includes Heart disease in his father and mother; Prostate cancer in his father. ROS:   Please see the history of present illness.    All 14 point review of systems negative except as described per history of present illness  EKGs/Labs/Other Studies Reviewed:      Recent Labs: 03/18/2019: TSH 3.739 01/27/2020: ALT 13; BUN 16; Creatinine 1.29; Hemoglobin 9.5; Platelet Count 346; Potassium 3.9; Sodium 137  Recent Lipid Panel    Component Value Date/Time   CHOL 230 (H)  03/23/2018 1847   TRIG 209 (H) 03/23/2018 1847   HDL 46 03/23/2018 1847   CHOLHDL 5.0 03/23/2018 1847   VLDL 42 (H) 03/23/2018 1847   LDLCALC 142 (H) 03/23/2018 1847    Physical Exam:    VS:  BP (!) 128/58 (BP Location: Right Arm, Patient Position: Sitting)   Pulse (!) 101   Ht 5\' 8"  (1.727 m)   Wt 154 lb (69.9 kg)   SpO2 92%   BMI 23.42 kg/m     Wt Readings from Last 3 Encounters:  01/27/20 154 lb (69.9 kg)  01/27/20 153 lb 1.9 oz (69.5 kg)  01/06/20 150 lb (68 kg)     GEN:  Well nourished, well developed in no acute distress HEENT: Normal NECK: No JVD; No carotid bruits LYMPHATICS: No lymphadenopathy CARDIAC: RRR, no murmurs, no rubs, no gallops RESPIRATORY:  Clear to auscultation without rales, wheezing or rhonchi  ABDOMEN: Soft, non-tender, non-distended MUSCULOSKELETAL:  No edema; No deformity  SKIN: Warm and dry LOWER EXTREMITIES: no swelling NEUROLOGIC:  Alert and oriented x 3 PSYCHIATRIC:  Normal affect   ASSESSMENT:    1. Metastasis to bone ([place])   2. Essential hypertension   3. Shortness of breath    PLAN:    In order of problems listed above:  1. Metastatic cancer to the bone.  Follow-up oncology team apparently stable disease. 2. Essential hypertension blood pressure well  controlled continue present management. 3. Dyspnea on exertion: I will schedule him to have echocardiogram to assess left ventricle ejection fraction. 4. Dyslipidemia his cholesterol still mildly elevated but because of his comorbidities we will continue present management.   Medication Adjustments/Labs and Tests Ordered: Current medicines are reviewed at length with the patient today.  Concerns regarding medicines are outlined above.  No orders of the defined types were placed in this encounter.  Medication changes: No orders of the defined types were placed in this encounter.   Signed, Park Liter, MD, [facility] 01/27/2020 2:01 PM    [place]

## 2020-01-27 NOTE — Progress Notes (Signed)
Hematology and Oncology Follow Up Visit  BRENNEN CAMPER [id number] 1945/09/27 75 y.o. 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 Iron def anemia -- malabsorption  Past Therapy: Zytiga 1000 mg by mouth daily - discontinued on 08/15/2016 Xtandi 160 mg po q day - start on 08/15/2016 - discontinued Radium-223 therapy -- s/p cycle #6 Palliative radiation to the right sacrum Lutathera - s/p cycle 4/4 --last dose on 07/11/2017 Carbo/VP-16/Tecentriq -- S/p cycle #5 Keytruda q 6 week -- Maintanence -- s/p cycle #3 - changed from 3 to 6 week on 06/30/2018 Taxotere/Keytruda -- start on 09/17/2018 -- s/p cycle #10 -- d/c due to progression  Current Therapy: Lurbinectedin --started on 04/29/2019, s/p cycle 13 Xgeva 120 mg subcutaneous Q3 months - due in 04/2020 Lupron 22 mg IM every 3 months - due in 04/2020  IV iron as indicated    Interim History:  Mr. Gosch is here today for follow-up and treatment. He is doing quite well at this time.  He has some mild fatigue at times.  He states that he had a couple episodes of SOB due to constipation. Once he had a BM his symptoms resolved.  No episodes of blood loss noted. No abnormal bruising, no petechiae.  Chromogranin A level last month was a little improved at 797.  No fever, chills, n/v, cough, rash, dizziness, chest pain, palpitations, abdominal pain or changes in bowel or bladder habits.  No swelling, tenderness, numbness or tingling in her extremities.  Pedal pulses are 2+. No falls or syncopal episodes to report.  He ambulates with a cane for added support.  He has maintained a good appetite and feels that he is hydrating well throughout the day. His weight is stable at 153 lbs.   ECOG Performance Status: 1 - Symptomatic but completely ambulatory  Medications:  Allergies as of 01/27/2020      Reactions   Codeine Nausea  And Vomiting   Hydrocodone Nausea And Vomiting      Medication List       Accurate as of January 27, 2020  9:19 AM. If you have any questions, ask your nurse or doctor.        aspirin 81 MG tablet Take 81 mg by mouth daily.   CALCIUM-IRON-VIT D-VIT K PO Take 1 tablet by mouth 2 (two) times daily.   dexamethasone 4 MG tablet Commonly known as: DECADRON TAKE 2 TABLETS (8 MG TOTAL) BY MOUTH 2 (TWO) TIMES DAILY FOR 5 DAYS. START 2 DAYS BEFORE EACH CHEMOTHERAPY. What changed: See the new instructions.   gabapentin 300 MG capsule Commonly known as: NEURONTIN TAKE 1 CAPSULE BY MOUTH TWICE A DAY THEN TAKE 2 CAPSULES AT BEDTIME   Generlac 10 GM/15ML Soln Generic drug: lactulose (encephalopathy) SMARTSIG:45 Milliliter(s) By Mouth Daily PRN   guaiFENesin 600 MG 12 hr tablet Commonly known as: MUCINEX Take 400 mg by mouth 2 (two) times daily.   isosorbide mononitrate 30 MG 24 hr tablet Commonly known as: IMDUR Take 30 mg by mouth daily.   lidocaine-prilocaine cream Commonly known as: EMLA   nitroGLYCERIN 0.4 MG SL tablet Commonly known as: NITROSTAT Place 1 tablet (0.4 mg total) under the tongue every 5 (five) minutes as needed for chest pain.   ondansetron 8 MG tablet Commonly known as: ZOFRAN Take 1 tablet (8 mg total) by mouth 2 (two) times daily as needed for nausea or vomiting.   OVER THE COUNTER MEDICATION  every morning. Juice Plus -- 8 capsule of each the garden, vineyard, and orchard twice a day.   Oyster Shell Calcium/D 250-125 MG-UNIT Tabs Take by mouth daily.   pantoprazole 40 MG tablet Commonly known as: PROTONIX TAKE 1 TABLET (40 MG TOTAL) BY MOUTH AT BEDTIME.   prochlorperazine 10 MG tablet Commonly known as: COMPAZINE TAKE 1 TABLET (10 MG TOTAL) BY MOUTH EVERY 6 (SIX) HOURS AS NEEDED (NAUSEA OR VOMITING).   senna-docusate 8.6-50 MG tablet Commonly known as: Senokot-S Take 2-4 tablets by mouth at bedtime.   sucralfate 1 GM/10ML suspension Commonly  known as: Carafate Take 10 mLs (1 g total) by mouth 4 (four) times daily -  with meals and at bedtime. What changed:   when to take this  reasons to take this   Zetia 10 MG tablet Generic drug: ezetimibe Take 10 mg by mouth daily.   Zinc 50 MG Caps Take 50 mg by mouth daily.       Allergies:  Allergies  Allergen Reactions  . Codeine Nausea And Vomiting  . Hydrocodone Nausea And Vomiting    Past Medical History, Surgical history, Social history, and Family History were reviewed and updated.  Review of Systems: All other 10 point review of systems is negative.   Physical Exam:  weight is 153 lb 1.9 oz (69.5 kg). His oral temperature is 97.9 F (36.6 C). His blood pressure is 123/75 and his pulse is 91. His respiration is 16 and oxygen saturation is 100%.   Wt Readings from Last 3 Encounters:  01/27/20 153 lb 1.9 oz (69.5 kg)  01/06/20 150 lb (68 kg)  12/16/19 151 lb (68.5 kg)    Ocular: Sclerae unicteric, pupils equal, round and reactive to light Ear-nose-throat: Oropharynx clear, dentition fair Lymphatic: No cervical or supraclavicular adenopathy Lungs no rales or rhonchi, good excursion bilaterally Heart regular rate and rhythm, no murmur appreciated Abd soft, nontender, positive bowel sounds MSK no focal spinal tenderness, no joint edema Neuro: non-focal, well-oriented, appropriate affect Breasts: Deferred   Lab Results  Component Value Date   WBC 5.6 01/27/2020   HGB 9.5 (L) 01/27/2020   HCT 29.1 (L) 01/27/2020   MCV 100.7 (H) 01/27/2020   PLT 346 01/27/2020   Lab Results  Component Value Date   FERRITIN 662 (H) 01/06/2020   IRON 61 01/06/2020   TIBC 236 01/06/2020   UIBC 175 01/06/2020   IRONPCTSAT 26 01/06/2020   Lab Results  Component Value Date   RETICCTPCT 1.6 04/08/2019   RBC 2.89 (L) 01/27/2020   No results found for: KPAFRELGTCHN, LAMBDASER, KAPLAMBRATIO No results found for: IGGSERUM, IGA, IGMSERUM No results found for: Odetta Pink, SPEI   Chemistry      Component Value Date/Time   NA 136 01/06/2020 0805   NA 144 12/14/2016 1159   NA 140 10/22/2016 1129   K 3.9 01/06/2020 0805   K 3.5 12/14/2016 1159   K 4.3 10/22/2016 1129   CL 103 01/06/2020 0805   CL 103 12/14/2016 1159   CO2 24 01/06/2020 0805   CO2 30 12/14/2016 1159   CO2 27 10/22/2016 1129   BUN 14 01/06/2020 0805   BUN 16 12/14/2016 1159   BUN 14.0 10/22/2016 1129   CREATININE 1.36 (H) 01/06/2020 0805   CREATININE 1.3 (H) 12/14/2016 1159   CREATININE 1.1 10/22/2016 1129      Component Value Date/Time   CALCIUM 9.3 01/06/2020 0805   CALCIUM 10.0 12/14/2016 1159  CALCIUM 10.4 10/22/2016 1129   ALKPHOS 50 01/06/2020 0805   ALKPHOS 48 12/14/2016 1159   ALKPHOS 60 10/22/2016 1129   AST 16 01/06/2020 0805   AST 16 10/22/2016 1129   ALT 11 01/06/2020 0805   ALT 23 12/14/2016 1159   ALT 14 10/22/2016 1129   BILITOT 0.2 (L) 01/06/2020 0805   BILITOT 0.31 10/22/2016 1129       Impression and Plan: Mr.Wrightis 75 yo caucasian gentleman with metastatic neuroendocrine carcinoma of unknown primary. He continues to tolerate Lurbinectidin nicely and nuclear medicine scan last month showed stable disease. We will proceed with treatment today as planned. He will also get his scheduled Lupron and Xgeva today as well.   Follow-up in 3 weeks.  We will plan to repeat his PET after the next cycle of treatment.  He was encouraged to contact our office with any questions or concerns. We can certainly see him sooner if needed.   Laverna Peace, NP 1/12/20229:19 AM

## 2020-01-27 NOTE — Patient Instructions (Signed)
Leuprolide injection What is this medicine? LEUPROLIDE (loo PROE lide) is a man-made hormone. It is used to treat the symptoms of prostate cancer. This medicine may also be used to treat children with early onset of puberty. It may be used for other hormonal conditions. This medicine may be used for other purposes; ask your health care provider or pharmacist if you have questions. COMMON BRAND NAME(S): Lupron What should I tell my health care provider before I take this medicine? They need to know if you have any of these conditions:  diabetes  heart disease or previous heart attack  high blood pressure  high cholesterol  pain or difficulty passing urine  spinal cord metastasis  stroke  tobacco smoker  an unusual or allergic reaction to leuprolide, benzyl alcohol, other medicines, foods, dyes, or preservatives  pregnant or trying to get pregnant  breast-feeding How should I use this medicine? This medicine is for injection under the skin or into a muscle. You will be taught how to prepare and give this medicine. Use exactly as directed. Take your medicine at regular intervals. Do not take your medicine more often than directed. It is important that you put your used needles and syringes in a special sharps container. Do not put them in a trash can. If you do not have a sharps container, call your pharmacist or healthcare provider to get one. A special MedGuide will be given to you by the pharmacist with each prescription and refill. Be sure to read this information carefully each time. Talk to your pediatrician regarding the use of this medicine in children. While this medicine may be prescribed for children as young as 8 years for selected conditions, precautions do apply. Overdosage: If you think you have taken too much of this medicine contact a poison control center or emergency room at once. NOTE: This medicine is only for you. Do not share this medicine with others. What if  I miss a dose? If you miss a dose, take it as soon as you can. If it is almost time for your next dose, take only that dose. Do not take double or extra doses. What may interact with this medicine? Do not take this medicine with any of the following medications:  chasteberry  cisapride  dronedarone  pimozide  thioridazine This medicine may also interact with the following medications:  herbal or dietary supplements, like black cohosh or DHEA  male hormones, like estrogens or progestins and birth control pills, patches, rings, or injections  male hormones, like testosterone  other medicines that prolong the QT interval (abnormal heart rhythm) This list may not describe all possible interactions. Give your health care provider a list of all the medicines, herbs, non-prescription drugs, or dietary supplements you use. Also tell them if you smoke, drink alcohol, or use illegal drugs. Some items may interact with your medicine. What should I watch for while using this medicine? Visit your doctor or health care professional for regular checks on your progress. During the first week, your symptoms may get worse, but then will improve as you continue your treatment. You may get hot flashes, increased bone pain, increased difficulty passing urine, or an aggravation of nerve symptoms. Discuss these effects with your doctor or health care professional, some of them may improve with continued use of this medicine. Male patients may experience a menstrual cycle or spotting during the first 2 months of therapy with this medicine. If this continues, contact your doctor or health care professional.  This medicine may increase blood sugar. Ask your healthcare provider if changes in diet or medicines are needed if you have diabetes. What side effects may I notice from receiving this medicine? Side effects that you should report to your doctor or health care professional as soon as possible:  allergic  reactions like skin rash, itching or hives, swelling of the face, lips, or tongue  breathing problems  chest pain  depression or memory disorders  pain in your legs or groin  pain at site where injected  severe headache  signs and symptoms of high blood sugar such as being more thirsty or hungry or having to urinate more than normal. You may also feel very tired or have blurry vision  swelling of the feet and legs  visual changes  vomiting Side effects that usually do not require medical attention (report to your doctor or health care professional if they continue or are bothersome):  breast swelling or tenderness  decrease in sex drive or performance  diarrhea  hot flashes  loss of appetite  muscle, joint, or bone pains  nausea  redness or irritation at site where injected  skin problems or acne This list may not describe all possible side effects. Call your doctor for medical advice about side effects. You may report side effects to FDA at 1-800-FDA-1088. Where should I keep my medicine? Keep out of the reach of children. Store below 25 degrees C (77 degrees F). Do not freeze. Protect from light. Do not use if it is not clear or if there are particles present. Throw away any unused medicine after the expiration date. NOTE: This sheet is a summary. It may not cover all possible information. If you have questions about this medicine, talk to your doctor, pharmacist, or health care provider.  2021 Elsevier/Gold Standard (2018-12-03 10:57:41) Denosumab injection What is this medicine? DENOSUMAB (den oh sue mab) slows bone breakdown. Prolia is used to treat osteoporosis in women after menopause and in men, and in people who are taking corticosteroids for 6 months or more. Xgeva is used to treat a high calcium level due to cancer and to prevent bone fractures and other bone problems caused by multiple myeloma or cancer bone metastases. Xgeva is also used to treat giant  cell tumor of the bone. This medicine may be used for other purposes; ask your health care provider or pharmacist if you have questions. COMMON BRAND NAME(S): Prolia, XGEVA What should I tell my health care provider before I take this medicine? They need to know if you have any of these conditions:  dental disease  having surgery or tooth extraction  infection  kidney disease  low levels of calcium or Vitamin D in the blood  malnutrition  on hemodialysis  skin conditions or sensitivity  thyroid or parathyroid disease  an unusual reaction to denosumab, other medicines, foods, dyes, or preservatives  pregnant or trying to get pregnant  breast-feeding How should I use this medicine? This medicine is for injection under the skin. It is given by a health care professional in a hospital or clinic setting. A special MedGuide will be given to you before each treatment. Be sure to read this information carefully each time. For Prolia, talk to your pediatrician regarding the use of this medicine in children. Special care may be needed. For Xgeva, talk to your pediatrician regarding the use of this medicine in children. While this drug may be prescribed for children as young as 13 years for selected   conditions, precautions do apply. Overdosage: If you think you have taken too much of this medicine contact a poison control center or emergency room at once. NOTE: This medicine is only for you. Do not share this medicine with others. What if I miss a dose? It is important not to miss your dose. Call your doctor or health care professional if you are unable to keep an appointment. What may interact with this medicine? Do not take this medicine with any of the following medications:  other medicines containing denosumab This medicine may also interact with the following medications:  medicines that lower your chance of fighting infection  steroid medicines like prednisone or  cortisone This list may not describe all possible interactions. Give your health care provider a list of all the medicines, herbs, non-prescription drugs, or dietary supplements you use. Also tell them if you smoke, drink alcohol, or use illegal drugs. Some items may interact with your medicine. What should I watch for while using this medicine? Visit your doctor or health care professional for regular checks on your progress. Your doctor or health care professional may order blood tests and other tests to see how you are doing. Call your doctor or health care professional for advice if you get a fever, chills or sore throat, or other symptoms of a cold or flu. Do not treat yourself. This drug may decrease your body's ability to fight infection. Try to avoid being around people who are sick. You should make sure you get enough calcium and vitamin D while you are taking this medicine, unless your doctor tells you not to. Discuss the foods you eat and the vitamins you take with your health care professional. See your dentist regularly. Brush and floss your teeth as directed. Before you have any dental work done, tell your dentist you are receiving this medicine. Do not become pregnant while taking this medicine or for 5 months after stopping it. Talk with your doctor or health care professional about your birth control options while taking this medicine. Women should inform their doctor if they wish to become pregnant or think they might be pregnant. There is a potential for serious side effects to an unborn child. Talk to your health care professional or pharmacist for more information. What side effects may I notice from receiving this medicine? Side effects that you should report to your doctor or health care professional as soon as possible:  allergic reactions like skin rash, itching or hives, swelling of the face, lips, or tongue  bone pain  breathing problems  dizziness  jaw pain, especially  after dental work  redness, blistering, peeling of the skin  signs and symptoms of infection like fever or chills; cough; sore throat; pain or trouble passing urine  signs of low calcium like fast heartbeat, muscle cramps or muscle pain; pain, tingling, numbness in the hands or feet; seizures  unusual bleeding or bruising  unusually weak or tired Side effects that usually do not require medical attention (report to your doctor or health care professional if they continue or are bothersome):  constipation  diarrhea  headache  joint pain  loss of appetite  muscle pain  runny nose  tiredness  upset stomach This list may not describe all possible side effects. Call your doctor for medical advice about side effects. You may report side effects to FDA at 1-800-FDA-1088. Where should I keep my medicine? This medicine is only given in a clinic, doctor's office, or other health care  setting and will not be stored at home. NOTE: This sheet is a summary. It may not cover all possible information. If you have questions about this medicine, talk to your doctor, pharmacist, or health care provider.  2021 Elsevier/Gold Standard (2017-05-10 16:10:44)

## 2020-01-27 NOTE — Telephone Encounter (Signed)
Appointments scheduled patient will get updated AVS after treatment today from infusion staff per 1/12 los

## 2020-01-29 LAB — CHROMOGRANIN A: Chromogranin A (ng/mL): 779.8 ng/mL — ABNORMAL HIGH (ref 0.0–101.8)

## 2020-02-03 ENCOUNTER — Inpatient Hospital Stay: Payer: Medicare Other

## 2020-02-03 ENCOUNTER — Telehealth: Payer: Self-pay | Admitting: *Deleted

## 2020-02-03 ENCOUNTER — Telehealth: Payer: Self-pay

## 2020-02-03 ENCOUNTER — Other Ambulatory Visit: Payer: Self-pay | Admitting: *Deleted

## 2020-02-03 ENCOUNTER — Other Ambulatory Visit: Payer: Self-pay

## 2020-02-03 VITALS — BP 105/61 | HR 101 | Resp 17

## 2020-02-03 DIAGNOSIS — D500 Iron deficiency anemia secondary to blood loss (chronic): Secondary | ICD-10-CM

## 2020-02-03 DIAGNOSIS — Z95828 Presence of other vascular implants and grafts: Secondary | ICD-10-CM

## 2020-02-03 DIAGNOSIS — C7A8 Other malignant neuroendocrine tumors: Secondary | ICD-10-CM | POA: Diagnosis not present

## 2020-02-03 DIAGNOSIS — C61 Malignant neoplasm of prostate: Secondary | ICD-10-CM

## 2020-02-03 DIAGNOSIS — C7B8 Other secondary neuroendocrine tumors: Secondary | ICD-10-CM | POA: Diagnosis not present

## 2020-02-03 DIAGNOSIS — C7951 Secondary malignant neoplasm of bone: Secondary | ICD-10-CM

## 2020-02-03 DIAGNOSIS — N390 Urinary tract infection, site not specified: Secondary | ICD-10-CM

## 2020-02-03 DIAGNOSIS — R319 Hematuria, unspecified: Secondary | ICD-10-CM

## 2020-02-03 DIAGNOSIS — Z5111 Encounter for antineoplastic chemotherapy: Secondary | ICD-10-CM | POA: Diagnosis not present

## 2020-02-03 DIAGNOSIS — D508 Other iron deficiency anemias: Secondary | ICD-10-CM | POA: Diagnosis not present

## 2020-02-03 LAB — CBC WITH DIFFERENTIAL (CANCER CENTER ONLY)
Abs Immature Granulocytes: 0.14 10*3/uL — ABNORMAL HIGH (ref 0.00–0.07)
Basophils Absolute: 0 10*3/uL (ref 0.0–0.1)
Basophils Relative: 0 %
Eosinophils Absolute: 0 10*3/uL (ref 0.0–0.5)
Eosinophils Relative: 0 %
HCT: 29.6 % — ABNORMAL LOW (ref 39.0–52.0)
Hemoglobin: 9.7 g/dL — ABNORMAL LOW (ref 13.0–17.0)
Immature Granulocytes: 1 %
Lymphocytes Relative: 8 %
Lymphs Abs: 0.9 10*3/uL (ref 0.7–4.0)
MCH: 32.3 pg (ref 26.0–34.0)
MCHC: 32.8 g/dL (ref 30.0–36.0)
MCV: 98.7 fL (ref 80.0–100.0)
Monocytes Absolute: 0.3 10*3/uL (ref 0.1–1.0)
Monocytes Relative: 3 %
Neutro Abs: 9.8 10*3/uL — ABNORMAL HIGH (ref 1.7–7.7)
Neutrophils Relative %: 88 %
Platelet Count: 260 10*3/uL (ref 150–400)
RBC: 3 MIL/uL — ABNORMAL LOW (ref 4.22–5.81)
RDW: 15.3 % (ref 11.5–15.5)
WBC Count: 11.2 10*3/uL — ABNORMAL HIGH (ref 4.0–10.5)
nRBC: 0 % (ref 0.0–0.2)

## 2020-02-03 LAB — URINALYSIS, COMPLETE (UACMP) WITH MICROSCOPIC
Bilirubin Urine: NEGATIVE
Glucose, UA: NEGATIVE mg/dL
Hgb urine dipstick: NEGATIVE
Ketones, ur: NEGATIVE mg/dL
Leukocytes,Ua: NEGATIVE
Nitrite: NEGATIVE
Protein, ur: NEGATIVE mg/dL
Specific Gravity, Urine: 1.025 (ref 1.005–1.030)
pH: 5 (ref 5.0–8.0)

## 2020-02-03 MED ORDER — SULFAMETHOXAZOLE-TRIMETHOPRIM 800-160 MG PO TABS: 1.0000 | ORAL_TABLET | Freq: Two times a day (BID) | ORAL | 0 refills | Status: DC

## 2020-02-03 MED ORDER — SODIUM CHLORIDE 0.9% FLUSH
10.0000 mL | Freq: Once | INTRAVENOUS | Status: AC
  Administered 2020-02-03: 10 mL via INTRAVENOUS
  Filled 2020-02-03: qty 10

## 2020-02-03 MED ORDER — HEPARIN SOD (PORK) LOCK FLUSH 100 UNIT/ML IV SOLN
500.0000 [IU] | Freq: Once | INTRAVENOUS | Status: AC
  Administered 2020-02-03: 500 [IU] via INTRAVENOUS
  Filled 2020-02-03: qty 5

## 2020-02-03 MED FILL — SULFAMETHOXAZOLE-TMP DS TAB: 800-160 | 7 days supply | Qty: 14 | Fill #0

## 2020-02-03 MED FILL — PANTOPRAZOLE SOD DR 40 MG T: 40 | 30 days supply | Qty: 30 | Fill #6

## 2020-02-03 NOTE — Telephone Encounter (Signed)
Called and informed patient of results and rx sent to pharmacy. Patient understands we will call him when his culture comes back as well.   He also states he wasn't sure if he had a urologist when he saw him the other day but talked with his wife and she reminded him he saw Dr.Wren at [facility] in the past. Informed MD.

## 2020-02-03 NOTE — Telephone Encounter (Signed)
-----   Message from [name], MD sent at 02/03/2020  1:13 PM EST ----- Call - looks like a UTI.  Please call in Bactrim DS  1 po BID x 7 days.  We will see what the bug is in 2-3 days.  [name]

## 2020-02-03 NOTE — Patient Instructions (Signed)

## 2020-02-03 NOTE — Telephone Encounter (Signed)
Message received from patient stating that he is having difficulty emptying his bladder and noticed a slight amount of blood in his urine this morning.  Call placed back to patient and patient notified to come in today for CBC, u/a and cx per order of Dr. Marin Olp.  Pt states that he is able to come in today for lab appt.  Message sent to scheduling.

## 2020-02-04 NOTE — Progress Notes (Signed)
..  Eligard 22.5mg  was dispensed on 01/27/2020, per Randolm Idol, RN, advised pharmacy of medication squirted everywhere when trying to dispense in syringe resulting in wasted dose.  [facility] was advised of malfunction and is processing replacement of medication waste. Finance team advised of replacement of waste.  Case #:[id number] Lot,Exp: [id number], 6/23

## 2020-02-05 LAB — URINE CULTURE: Culture: 60000 — AB

## 2020-02-17 ENCOUNTER — Inpatient Hospital Stay (HOSPITAL_BASED_OUTPATIENT_CLINIC_OR_DEPARTMENT_OTHER): Payer: Medicare Other | Admitting: Family

## 2020-02-17 ENCOUNTER — Inpatient Hospital Stay: Payer: Medicare Other

## 2020-02-17 ENCOUNTER — Inpatient Hospital Stay: Payer: Medicare Other | Attending: Hematology & Oncology

## 2020-02-17 ENCOUNTER — Other Ambulatory Visit: Payer: Self-pay

## 2020-02-17 ENCOUNTER — Telehealth: Payer: Self-pay | Admitting: *Deleted

## 2020-02-17 VITALS — BP 129/77 | HR 98 | Temp 98.1°F | Resp 18 | Ht 68.0 in | Wt 156.0 lb

## 2020-02-17 DIAGNOSIS — C7951 Secondary malignant neoplasm of bone: Secondary | ICD-10-CM

## 2020-02-17 DIAGNOSIS — Z79899 Other long term (current) drug therapy: Secondary | ICD-10-CM | POA: Diagnosis not present

## 2020-02-17 DIAGNOSIS — Z5111 Encounter for antineoplastic chemotherapy: Secondary | ICD-10-CM | POA: Diagnosis not present

## 2020-02-17 DIAGNOSIS — K909 Intestinal malabsorption, unspecified: Secondary | ICD-10-CM | POA: Diagnosis not present

## 2020-02-17 DIAGNOSIS — D500 Iron deficiency anemia secondary to blood loss (chronic): Secondary | ICD-10-CM

## 2020-02-17 DIAGNOSIS — R399 Unspecified symptoms and signs involving the genitourinary system: Secondary | ICD-10-CM | POA: Diagnosis not present

## 2020-02-17 DIAGNOSIS — M79605 Pain in left leg: Secondary | ICD-10-CM | POA: Diagnosis not present

## 2020-02-17 DIAGNOSIS — C7A8 Other malignant neuroendocrine tumors: Secondary | ICD-10-CM

## 2020-02-17 DIAGNOSIS — C61 Malignant neoplasm of prostate: Secondary | ICD-10-CM | POA: Diagnosis not present

## 2020-02-17 DIAGNOSIS — D508 Other iron deficiency anemias: Secondary | ICD-10-CM | POA: Insufficient documentation

## 2020-02-17 DIAGNOSIS — M79604 Pain in right leg: Secondary | ICD-10-CM | POA: Diagnosis not present

## 2020-02-17 DIAGNOSIS — M6250 Muscle wasting and atrophy, not elsewhere classified, unspecified site: Secondary | ICD-10-CM | POA: Diagnosis not present

## 2020-02-17 DIAGNOSIS — Z885 Allergy status to narcotic agent status: Secondary | ICD-10-CM | POA: Diagnosis not present

## 2020-02-17 DIAGNOSIS — N390 Urinary tract infection, site not specified: Secondary | ICD-10-CM | POA: Diagnosis not present

## 2020-02-17 DIAGNOSIS — R319 Hematuria, unspecified: Secondary | ICD-10-CM

## 2020-02-17 LAB — URINALYSIS, COMPLETE (UACMP) WITH MICROSCOPIC
Bilirubin Urine: NEGATIVE
Glucose, UA: NEGATIVE mg/dL
Hgb urine dipstick: NEGATIVE
Ketones, ur: NEGATIVE mg/dL
Leukocytes,Ua: NEGATIVE
Nitrite: NEGATIVE
Protein, ur: NEGATIVE mg/dL
Specific Gravity, Urine: 1.02 (ref 1.005–1.030)
pH: 6 (ref 5.0–8.0)

## 2020-02-17 LAB — CMP (CANCER CENTER ONLY)
ALT: 12 U/L (ref 0–44)
AST: 15 U/L (ref 15–41)
Albumin: 4.1 g/dL (ref 3.5–5.0)
Alkaline Phosphatase: 55 U/L (ref 38–126)
Anion gap: 8 (ref 5–15)
BUN: 19 mg/dL (ref 8–23)
CO2: 26 mmol/L (ref 22–32)
Calcium: 9.8 mg/dL (ref 8.9–10.3)
Chloride: 103 mmol/L (ref 98–111)
Creatinine: 1.29 mg/dL — ABNORMAL HIGH (ref 0.61–1.24)
GFR, Estimated: 59 mL/min — ABNORMAL LOW (ref >60)
Glucose, Bld: 108 mg/dL — ABNORMAL HIGH (ref 70–99)
Potassium: 3.9 mmol/L (ref 3.5–5.1)
Sodium: 137 mmol/L (ref 135–145)
Total Bilirubin: 0.2 mg/dL — ABNORMAL LOW (ref 0.3–1.2)
Total Protein: 6.5 g/dL (ref 6.5–8.1)

## 2020-02-17 LAB — FERRITIN: Ferritin: 488 ng/mL — ABNORMAL HIGH (ref 24–336)

## 2020-02-17 LAB — IRON AND TIBC
Iron: 78 ug/dL (ref 42–163)
Saturation Ratios: 32 % (ref 20–55)
TIBC: 241 ug/dL (ref 202–409)
UIBC: 163 ug/dL (ref 117–376)

## 2020-02-17 LAB — CBC WITH DIFFERENTIAL (CANCER CENTER ONLY)
Abs Immature Granulocytes: 0.05 10*3/uL (ref 0.00–0.07)
Basophils Absolute: 0 10*3/uL (ref 0.0–0.1)
Basophils Relative: 1 %
Eosinophils Absolute: 0.2 10*3/uL (ref 0.0–0.5)
Eosinophils Relative: 4 %
HCT: 28.2 % — ABNORMAL LOW (ref 39.0–52.0)
Hemoglobin: 9.1 g/dL — ABNORMAL LOW (ref 13.0–17.0)
Immature Granulocytes: 1 %
Lymphocytes Relative: 22 %
Lymphs Abs: 1 10*3/uL (ref 0.7–4.0)
MCH: 32.5 pg (ref 26.0–34.0)
MCHC: 32.3 g/dL (ref 30.0–36.0)
MCV: 100.7 fL — ABNORMAL HIGH (ref 80.0–100.0)
Monocytes Absolute: 0.7 10*3/uL (ref 0.1–1.0)
Monocytes Relative: 14 %
Neutro Abs: 2.7 10*3/uL (ref 1.7–7.7)
Neutrophils Relative %: 58 %
Platelet Count: 394 10*3/uL (ref 150–400)
RBC: 2.8 MIL/uL — ABNORMAL LOW (ref 4.22–5.81)
RDW: 15.4 % (ref 11.5–15.5)
WBC Count: 4.7 10*3/uL (ref 4.0–10.5)
nRBC: 0 % (ref 0.0–0.2)

## 2020-02-17 LAB — LACTATE DEHYDROGENASE: LDH: 151 U/L (ref 98–192)

## 2020-02-17 MED ORDER — SODIUM CHLORIDE 0.9 % IV SOLN
10.0000 mg | Freq: Once | INTRAVENOUS | Status: AC
  Administered 2020-02-17: 10 mg via INTRAVENOUS
  Filled 2020-02-17: qty 10

## 2020-02-17 MED ORDER — SODIUM CHLORIDE 0.9 % IV SOLN
Freq: Once | INTRAVENOUS | Status: AC
  Filled 2020-02-17: qty 250

## 2020-02-17 MED ORDER — PALONOSETRON HCL INJECTION 0.25 MG/5ML
0.2500 mg | Freq: Once | INTRAVENOUS | Status: AC
Start: 2020-02-17 — End: 2020-02-17
  Administered 2020-02-17: 0.25 mg via INTRAVENOUS

## 2020-02-17 MED ORDER — DIPHENHYDRAMINE HCL 25 MG PO CAPS
ORAL_CAPSULE | ORAL | Status: AC
  Filled 2020-02-17: qty 1

## 2020-02-17 MED ORDER — SODIUM CHLORIDE 0.9% FLUSH
10.0000 mL | INTRAVENOUS | Status: DC | PRN
  Administered 2020-02-17: 10 mL
  Filled 2020-02-17: qty 10

## 2020-02-17 MED ORDER — PALONOSETRON HCL INJECTION 0.25 MG/5ML
INTRAVENOUS | Status: AC
  Filled 2020-02-17: qty 5

## 2020-02-17 MED ORDER — SODIUM CHLORIDE 0.9 % IV SOLN
3.2000 mg/m2 | Freq: Once | INTRAVENOUS | Status: AC
  Administered 2020-02-17: 5.8 mg via INTRAVENOUS
  Filled 2020-02-17: qty 11.6

## 2020-02-17 MED ORDER — ACETAMINOPHEN 325 MG PO TABS
ORAL_TABLET | ORAL | Status: AC
  Filled 2020-02-17: qty 2

## 2020-02-17 MED ORDER — HEPARIN SOD (PORK) LOCK FLUSH 100 UNIT/ML IV SOLN
500.0000 [IU] | Freq: Once | INTRAVENOUS | Status: AC | PRN
  Administered 2020-02-17: 500 [IU]
  Filled 2020-02-17: qty 5

## 2020-02-17 NOTE — Telephone Encounter (Signed)
Per 02/17/20 los appts already scheduled

## 2020-02-17 NOTE — Progress Notes (Signed)
Hematology and Oncology Follow Up Visit  [name] [id number] Sep 13, 1945 75 y.o. 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 Iron def anemia -- malabsorption  Past Therapy: Zytiga 1000 mg by mouth daily - discontinued on 08/15/2016 Xtandi 160 mg po q day - start on 08/15/2016 - discontinued Radium-223 therapy -- s/p cycle #6 Palliative radiation to the right sacrum Lutathera - s/p cycle 4/4 --last dose on 07/11/2017 Carbo/VP-16/Tecentriq -- S/p cycle #5 Keytruda q 6 week -- Maintanence -- s/p cycle #3 - changed from 3 to 6 week on 06/30/2018 Taxotere/Keytruda -- start on 09/17/2018 -- s/p cycle #10 -- d/c due to progression  Current Therapy: Lurbinectedin --started on 04/29/2019, s/p cycle 14 Xgeva 120 mg subcutaneous Q3 months - due in 04/2020 Lupron 22 mg IM every 3 months - due in 04/2020  IV iron as indicated    Interim History:  Mr. Batz is here today for follow-up and treatment. He is doing well but states that he is still having a little difficulty emptying his bladder. He denies burning or pressure.  No fever, chills, n/v, cough, rash, dizziness, SOB, chest pain, palpitations, abdominal pain or changes in bowel or bladder habits.  Chromogranin A last month was 779.  He is scheduled for ECHO next week on Monday.  He had loose stools on saturday and once on Monday. No episodes since then. He states that this happens when he takes Senokot.  He has occasional nose bleeds but states that this are minimal and stop quickly.  No other blood loss noted. No abnormal bruising, no petechiae.  No swelling, tenderness, numbness or tingling in his extremities.  He ambulates with a cane for added support.  No falls or syncope.  He has maintained a good appetite and is staying well hydrated. Her weight is stable at 156 lbs.  He walks inside his home and around his  cul-de-sac for exercise.   ECOG Performance Status: 1 - Symptomatic but completely ambulatory  Medications:  Allergies as of 02/17/2020      Reactions   Codeine Nausea And Vomiting   Hydrocodone Nausea And Vomiting      Medication List       Accurate as of February 17, 2020  9:55 AM. If you have any questions, ask your nurse or doctor.        aspirin 81 MG tablet Take 81 mg by mouth daily.   CALCIUM-IRON-VIT D-VIT K PO Take 1 tablet by mouth 2 (two) times daily.   dexamethasone 4 MG tablet Commonly known as: DECADRON TAKE 2 TABLETS (8 MG TOTAL) BY MOUTH 2 (TWO) TIMES DAILY FOR 5 DAYS. START 2 DAYS BEFORE EACH CHEMOTHERAPY. What changed: See the new instructions.   gabapentin 300 MG capsule Commonly known as: NEURONTIN TAKE 1 CAPSULE BY MOUTH TWICE A DAY THEN TAKE 2 CAPSULES AT BEDTIME   Generlac 10 GM/15ML Soln Generic drug: lactulose (encephalopathy) SMARTSIG:45 Milliliter(s) By Mouth Daily PRN   guaiFENesin 600 MG 12 hr tablet Commonly known as: MUCINEX Take 400 mg by mouth 2 (two) times daily.   isosorbide mononitrate 30 MG 24 hr tablet Commonly known as: IMDUR Take 30 mg by mouth daily.   lidocaine-prilocaine cream Commonly known as: EMLA   nitroGLYCERIN 0.4 MG SL tablet Commonly known as: NITROSTAT Place 1 tablet (0.4 mg total) under the tongue every 5 (five) minutes as needed for chest pain.   ondansetron 8 MG tablet  Commonly known as: ZOFRAN Take 1 tablet (8 mg total) by mouth 2 (two) times daily as needed for nausea or vomiting.   OVER THE COUNTER MEDICATION every morning. Juice Plus -- 8 capsule of each the garden, vineyard, and orchard twice a day.   Oyster Shell Calcium/D 250-125 MG-UNIT Tabs Take by mouth daily.   pantoprazole 40 MG tablet Commonly known as: PROTONIX TAKE 1 TABLET (40 MG TOTAL) BY MOUTH AT BEDTIME.   prochlorperazine 10 MG tablet Commonly known as: COMPAZINE TAKE 1 TABLET (10 MG TOTAL) BY MOUTH EVERY 6 (SIX) HOURS AS NEEDED  (NAUSEA OR VOMITING).   senna-docusate 8.6-50 MG tablet Commonly known as: Senokot-S Take 2-4 tablets by mouth at bedtime.   sucralfate 1 GM/10ML suspension Commonly known as: Carafate Take 10 mLs (1 g total) by mouth 4 (four) times daily -  with meals and at bedtime. What changed:   when to take this  reasons to take this   sulfamethoxazole-trimethoprim 800-160 MG tablet Commonly known as: BACTRIM DS Take 1 tablet by mouth 2 (two) times daily.   Zetia 10 MG tablet Generic drug: ezetimibe Take 10 mg by mouth daily.   Zinc 50 MG Caps Take 50 mg by mouth daily.       Allergies:  Allergies  Allergen Reactions  . Codeine Nausea And Vomiting  . Hydrocodone Nausea And Vomiting    Past Medical History, Surgical history, Social history, and Family History were reviewed and updated.  Review of Systems: All other 10 point review of systems is negative.   Physical Exam:  height is 5\' 8"  (1.727 m) and weight is 156 lb (70.8 kg). His oral temperature is 98.1 F (36.7 C). His blood pressure is 129/77 and his pulse is 98. His respiration is 18 and oxygen saturation is 100%.   Wt Readings from Last 3 Encounters:  02/17/20 156 lb (70.8 kg)  01/27/20 154 lb (69.9 kg)  01/27/20 153 lb 1.9 oz (69.5 kg)    Ocular: Sclerae unicteric, pupils equal, round and reactive to light Ear-nose-throat: Oropharynx clear, dentition fair Lymphatic: No cervical or supraclavicular adenopathy Lungs no rales or rhonchi, good excursion bilaterally Heart regular rate and rhythm, no murmur appreciated Abd soft, nontender, positive bowel sounds MSK no focal spinal tenderness, no joint edema Neuro: non-focal, well-oriented, appropriate affect Breasts: Deferred   Lab Results  Component Value Date   WBC 4.7 02/17/2020   HGB 9.1 (L) 02/17/2020   HCT 28.2 (L) 02/17/2020   MCV 100.7 (H) 02/17/2020   PLT 394 02/17/2020   Lab Results  Component Value Date   FERRITIN 662 (H) 01/06/2020   IRON 61  01/06/2020   TIBC 236 01/06/2020   UIBC 175 01/06/2020   IRONPCTSAT 26 01/06/2020   Lab Results  Component Value Date   RETICCTPCT 1.6 04/08/2019   RBC 2.80 (L) 02/17/2020   No results found for: KPAFRELGTCHN, LAMBDASER, KAPLAMBRATIO No results found for: IGGSERUM, IGA, IGMSERUM No results found for: Odetta Pink, SPEI   Chemistry      Component Value Date/Time   NA 137 01/27/2020 0903   NA 144 12/14/2016 1159   NA 140 10/22/2016 1129   K 3.9 01/27/2020 0903   K 3.5 12/14/2016 1159   K 4.3 10/22/2016 1129   CL 104 01/27/2020 0903   CL 103 12/14/2016 1159   CO2 26 01/27/2020 0903   CO2 30 12/14/2016 1159   CO2 27 10/22/2016 1129   BUN 16 01/27/2020  3578   BUN 16 12/14/2016 1159   BUN 14.0 10/22/2016 1129   CREATININE 1.29 (H) 01/27/2020 0903   CREATININE 1.3 (H) 12/14/2016 1159   CREATININE 1.1 10/22/2016 1129      Component Value Date/Time   CALCIUM 9.1 01/27/2020 0903   CALCIUM 10.0 12/14/2016 1159   CALCIUM 10.4 10/22/2016 1129   ALKPHOS 56 01/27/2020 0903   ALKPHOS 48 12/14/2016 1159   ALKPHOS 60 10/22/2016 1129   AST 14 (L) 01/27/2020 0903   AST 16 10/22/2016 1129   ALT 13 01/27/2020 0903   ALT 23 12/14/2016 1159   ALT 14 10/22/2016 1129   BILITOT 0.2 (L) 01/27/2020 0903   BILITOT 0.31 10/22/2016 1129       Impression and Plan: Mr.Wrightis 75 yo caucasian gentleman with metastatic neuroendocrine carcinoma of unknown primary. We will proceed with treatment today as planned.  UA and culture are pending.  We will repeat a PET scan right before follow-up.  Follow-up in 3 weeks.  He can contact our office with any questions or concerns.   Laverna Peace, NP 2/2/20229:55 AM

## 2020-02-17 NOTE — Patient Instructions (Signed)
Dexamethasone injection What is this medicine? DEXAMETHASONE (dex a METH a sone) is a corticosteroid. It is used to treat inflammation of the skin, joints, lungs, and other organs. Common conditions treated include asthma, allergies, and arthritis. It is also used for other conditions, like blood disorders and diseases of the adrenal glands. This medicine may be used for other purposes; ask your health care provider or pharmacist if you have questions. COMMON BRAND NAME(S): Decadron, DoubleDex, ReadySharp Dexamethasone, Simplist Dexamethasone, Solurex What should I tell my health care provider before I take this medicine? They need to know if you have any of these conditions:  Cushing's syndrome  diabetes  glaucoma  heart disease  high blood pressure  infection like herpes, measles, tuberculosis, or chickenpox  kidney disease  liver disease  mental illness  myasthenia gravis  osteoporosis  previous heart attack  seizures  stomach or intestine problems  thyroid disease  an unusual or allergic reaction to dexamethasone, corticosteroids, other medicines, lactose, foods, dyes, or preservatives  pregnant or trying to get pregnant  breast-feeding How should I use this medicine? This medicine is for injection into a muscle, joint, lesion, soft tissue, or vein. It is given by a health care professional in a hospital or clinic setting. Talk to your pediatrician regarding the use of this medicine in children. Special care may be needed. Overdosage: If you think you have taken too much of this medicine contact a poison control center or emergency room at once. NOTE: This medicine is only for you. Do not share this medicine with others. What if I miss a dose? This may not apply. If you are having a series of injections over a prolonged period, try not to miss an appointment. Call your doctor or health care professional to reschedule if you are unable to keep an appointment. What  may interact with this medicine? Do not take this medicine with any of the following medications:  live virus vaccines This medicine may also interact with the following medications:  aminoglutethimide  amphotericin B  aspirin and aspirin-like medicines  certain antibiotics like erythromycin, clarithromycin, and troleandomycin  certain antivirals for HIV or hepatitis  certain medicines for seizures like carbamazepine, phenobarbital, phenytoin  certain medicines to treat myasthenia gravis  cholestyramine  cyclosporine  digoxin  diuretics  ephedrine  male hormones, like estrogen or progestins and birth control pills  insulin or other medicines for diabetes  isoniazid  ketoconazole  medicines that relax muscles for surgery  mifepristone  NSAIDs, medicines for pain and inflammation, like ibuprofen or naproxen  rifampin  skin tests for allergies  thalidomide  vaccines  warfarin This list may not describe all possible interactions. Give your health care provider a list of all the medicines, herbs, non-prescription drugs, or dietary supplements you use. Also tell them if you smoke, drink alcohol, or use illegal drugs. Some items may interact with your medicine. What should I watch for while using this medicine? Visit your health care professional for regular checks on your progress. Tell your health care professional if your symptoms do not start to get better or if they get worse. Your condition will be monitored carefully while you are receiving this medicine. Wear a medical ID bracelet or chain. Carry a card that describes your disease and details of your medicine and dosage times. This medicine may increase your risk of getting an infection. Call your health care professional for advice if you get a fever, chills, or sore throat, or other symptoms of a  cold or flu. Do not treat yourself. Try to avoid being around people who are sick. Call your health care  professional if you are around anyone with measles, chickenpox, or if you develop sores or blisters that do not heal properly. If you are going to need surgery or other procedures, tell your doctor or health care professional that you have taken this medicine within the last 12 months. Ask your doctor or health care professional about your diet. You may need to lower the amount of salt you eat. This medicine may increase blood sugar. Ask your healthcare provider if changes in diet or medicines are needed if you have diabetes. What side effects may I notice from receiving this medicine? Side effects that you should report to your doctor or health care professional as soon as possible:  allergic reactions like skin rash, itching or hives, swelling of the face, lips, or tongue  bloody or black, tarry stools  changes in emotions or moods  changes in vision  confusion, excitement, restlessness  depressed mood  eye pain  hallucinations  muscle weakness  severe or sudden stomach or belly pain  signs and symptoms of high blood sugar such as being more thirsty or hungry or having to urinate more than normal. You may also feel very tired or have blurry vision.  signs and symptoms of infection like fever; chills; cough; sore throat; pain or trouble passing urine  swelling of ankles, feet  unusual bruising or bleeding  wounds that do not heal Side effects that usually do not require medical attention (report to your doctor or health care professional if they continue or are bothersome):  increased appetite  increased growth of face or body hair  headache  nausea, vomiting  pain, redness, or irritation at site where injected  skin problems, acne, thin and shiny skin  trouble sleeping  weight gain This list may not describe all possible side effects. Call your doctor for medical advice about side effects. You may report side effects to FDA at 1-800-FDA-1088. Where should I  keep my medicine? This medicine is given in a hospital or clinic and will not be stored at home. NOTE: This sheet is a summary. It may not cover all possible information. If you have questions about this medicine, talk to your doctor, pharmacist, or health care provider.  2021 Elsevier/Gold Standard (2018-07-15 13:51:58) Palonosetron Injection What is this medicine? PALONOSETRON (pal oh NOE se tron) is used to prevent nausea and vomiting caused by chemotherapy. It also helps prevent delayed nausea and vomiting that may occur a few days after your treatment. This medicine may be used for other purposes; ask your health care provider or pharmacist if you have questions. COMMON BRAND NAME(S): Aloxi What should I tell my health care provider before I take this medicine? They need to know if you have any of these conditions:  an unusual or allergic reaction to palonosetron, dolasetron, granisetron, ondansetron, other medicines, foods, dyes, or preservatives  pregnant or trying to get pregnant  breast-feeding How should I use this medicine? This medicine is for infusion into a vein. It is given by a health care professional in a hospital or clinic setting. Talk to your pediatrician regarding the use of this medicine in children. While this drug may be prescribed for children as young as 1 month for selected conditions, precautions do apply. Overdosage: If you think you have taken too much of this medicine contact a poison control center or emergency room at once.  NOTE: This medicine is only for you. Do not share this medicine with others. What if I miss a dose? This does not apply. What may interact with this medicine?  certain medicines for depression, anxiety, or psychotic disturbances  fentanyl  linezolid  MAOIs like Carbex, Eldepryl, Marplan, Nardil, and Parnate  methylene blue (injected into a vein)  tramadol This list may not describe all possible interactions. Give your health  care provider a list of all the medicines, herbs, non-prescription drugs, or dietary supplements you use. Also tell them if you smoke, drink alcohol, or use illegal drugs. Some items may interact with your medicine. What should I watch for while using this medicine? Your condition will be monitored carefully while you are receiving this medicine. What side effects may I notice from receiving this medicine? Side effects that you should report to your doctor or health care professional as soon as possible:  allergic reactions like skin rash, itching or hives, swelling of the face, lips, or tongue  breathing problems  confusion  dizziness  fast, irregular heartbeat  fever and chills  loss of balance or coordination  seizures  sweating  swelling of the hands and feet  tremors  unusually weak or tired Side effects that usually do not require medical attention (report to your doctor or health care professional if they continue or are bothersome):  constipation or diarrhea  headache This list may not describe all possible side effects. Call your doctor for medical advice about side effects. You may report side effects to FDA at 1-800-FDA-1088. Where should I keep my medicine? This drug is given in a hospital or clinic and will not be stored at home. NOTE: This sheet is a summary. It may not cover all possible information. If you have questions about this medicine, talk to your doctor, pharmacist, or health care provider.  2021 Elsevier/Gold Standard (2012-11-07 10:38:36) Lurbinectedin Injection What is this medicine? LURBINECTEDIN (LOOR bin EK te din) is a chemotherapy drug. This medicine is used to treat lung cancer. This medicine may be used for other purposes; ask your health care provider or pharmacist if you have questions. COMMON BRAND NAME(S): ZEPZELCA What should I tell my health care provider before I take this medicine? They need to know if you have any of these  conditions:  infection (especially a viral infection such as chickenpox, cold sores, or herpes)  liver disease  low blood counts, like white cells, platelets, or red blood cells  an unusual or allergic reaction to lurbinectedin, other medicines, foods, dyes or preservatives  pregnant or trying to get pregnant  breast-feeding How should I use this medicine? This medicine is for infusion into a vein. It is given by a healthcare professional in a hospital or clinic setting. Talk to your pediatrician about the use of this medicine in children. Special care may be needed. Overdosage: If you think you have taken too much of this medicine contact a poison control center or emergency room at once. NOTE: This medicine is only for you. Do not share this medicine with others. What if I miss a dose? Keep appointments for follow-up doses. It is important not to miss your dose. Call your doctor or health care professional if you are unable to keep an appointment. What may interact with this medicine?  certain antibiotics like erythromycin or clarithromycin  certain antivirals for HIV or hepatitis  certain medicines for fungal infections like ketoconazole, itraconazole, or posaconazole  certain medicines for seizures like carbamazepine, phenobarbital,  phenytoin  grapefruit or grapefruit juice  St. John's Wort This list may not describe all possible interactions. Give your health care provider a list of all the medicines, herbs, non-prescription drugs, or dietary supplements you use. Also tell them if you smoke, drink alcohol, or use illegal drugs. Some items may interact with your medicine. What should I watch for while using this medicine? Your condition will be monitored carefully while you are receiving this medicine. Do not become pregnant while taking this medicine or for 6 months after stopping it. Women should inform their health care professional if they wish to become pregnant or think  they might be pregnant. Men should not father a child while taking this medicine and for 4 months after stopping it. There is potential for serious side effects to an unborn child. Talk to your health care professional for more information. Do not breast-feed a child while taking this medicine or for 2 weeks after stopping it. Call your health care professional for advice if you get a fever, chills, or sore throat, or other symptoms of a cold or flu. Do not treat yourself. This medicine decreases your body's ability to fight infections. Try to avoid being around people who are sick. Avoid taking medicines that contain aspirin, acetaminophen, ibuprofen, naproxen, or ketoprofen unless instructed by your health care professional. These medicines may hide a fever. Be careful brushing or flossing your teeth or using a toothpick because you may get an infection or bleed more easily. If you have any dental work done, tell your dentist you are receiving this medicine. What side effects may I notice from receiving this medicine? Side effects that you should report to your doctor or health care professional as soon as possible:  allergic reactions like skin rash, itching or hives; swelling of the face, lips, or tongue  chest pain  nausea, vomiting  signs and symptoms of bleeding such as bloody or black, tarry stools; red or dark-brown urine; spitting up blood or brown material that looks like coffee grounds; red spots on the skin; unusual bruising or bleeding from the eyes, gums, or nose  signs and symptoms of infection like fever; chills; cough; sore throat; pain or trouble passing urine  signs and symptoms of liver injury like dark yellow or brown urine; general ill feeling or flu-like symptoms; light-colored stools; loss of appetite; nausea; right upper belly pain; unusually weak or tired; yellowing of the eyes or skin Side effects that usually do not require medical attention (report these to your  doctor or health care professional if they continue or are bothersome):  changes in taste  constipation  diarrhea  loss of appetite  muscle pain  pain, tingling, numbness in the hands or feet  signs and symptoms of high blood sugar such as being more thirsty or hungry or having to urinate more than normal. You may also feel very tired or have blurry vision.  signs and symptoms of low magnesium like muscle cramps; muscle pain; muscle weakness; tremors; seizures; or fast, irregular heartbeat  signs and symptoms of low red blood cells or anemia such as unusually weak or tired; feeling faint or lightheaded; falls; breathing problems  stomach pain This list may not describe all possible side effects. Call your doctor for medical advice about side effects. You may report side effects to FDA at 1-800-FDA-1088. Where should I keep my medicine? This medicine is given in a hospital or clinic and will not be stored at home. NOTE: This sheet is  a summary. It may not cover all possible information. If you have questions about this medicine, talk to your doctor, pharmacist, or health care provider.  2021 Elsevier/Gold Standard (2018-07-03 13:00:33)

## 2020-02-19 ENCOUNTER — Other Ambulatory Visit (HOSPITAL_BASED_OUTPATIENT_CLINIC_OR_DEPARTMENT_OTHER): Payer: Medicare Other

## 2020-02-19 ENCOUNTER — Other Ambulatory Visit: Payer: Self-pay | Admitting: Hematology & Oncology

## 2020-02-19 ENCOUNTER — Telehealth: Payer: Self-pay | Admitting: *Deleted

## 2020-02-19 LAB — URINE CULTURE: Culture: 40000 — AB

## 2020-02-19 LAB — CHROMOGRANIN A: Chromogranin A (ng/mL): 1016 ng/mL — ABNORMAL HIGH (ref 0.0–101.8)

## 2020-02-19 MED ORDER — CIPROFLOXACIN HCL 500 MG PO TABS: 500.0000 mg | ORAL_TABLET | Freq: Two times a day (BID) | ORAL | 0 refills | Status: DC

## 2020-02-19 MED FILL — CIPROFLOXACIN HCL 500 MG TA: 500 | 5 days supply | Qty: 10 | Fill #0

## 2020-02-19 NOTE — Telephone Encounter (Signed)
Labs reviewed with MD, Rx for cipro 500mg  BID x 5 days sent to [facility], [place]. Called notified pt. No concerns at this time.

## 2020-02-22 ENCOUNTER — Other Ambulatory Visit: Payer: Self-pay

## 2020-02-22 ENCOUNTER — Ambulatory Visit (HOSPITAL_BASED_OUTPATIENT_CLINIC_OR_DEPARTMENT_OTHER)
Admission: RE | Admit: 2020-02-22 | Discharge: 2020-02-22 | Disposition: A | Discharge status: Home / Self Care | Payer: Medicare Other | Source: Ambulatory Visit | Attending: Cardiology | Admitting: Cardiology

## 2020-02-22 DIAGNOSIS — R0602 Shortness of breath: Secondary | ICD-10-CM | POA: Diagnosis not present

## 2020-02-25 LAB — ECHOCARDIOGRAM COMPLETE
Area-P 1/2: 4.41 cm2
S' Lateral: 2.06 cm

## 2020-03-02 NOTE — Telephone Encounter (Signed)
na

## 2020-03-03 ENCOUNTER — Other Ambulatory Visit: Payer: Self-pay | Admitting: Hematology & Oncology

## 2020-03-03 MED FILL — GABAPENTIN 300 MG CAPSULE: 300 | 30 days supply | Qty: 120 | Fill #2

## 2020-03-03 MED FILL — EZETIMIBE 10 MG TABS: 10 | 90 days supply | Qty: 90 | Fill #1

## 2020-03-03 MED FILL — PANTOPRAZOLE SOD DR 40 MG T: 40 | 30 days supply | Qty: 30 | Fill #0

## 2020-03-08 ENCOUNTER — Ambulatory Visit (HOSPITAL_COMMUNITY)
Admission: RE | Admit: 2020-03-08 | Discharge: 2020-03-08 | Disposition: A | Discharge status: Home / Self Care | Payer: Medicare Other | Source: Ambulatory Visit | Attending: Family | Admitting: Family

## 2020-03-08 ENCOUNTER — Other Ambulatory Visit: Payer: Self-pay

## 2020-03-08 DIAGNOSIS — C7951 Secondary malignant neoplasm of bone: Secondary | ICD-10-CM | POA: Insufficient documentation

## 2020-03-08 DIAGNOSIS — Z8546 Personal history of malignant neoplasm of prostate: Secondary | ICD-10-CM | POA: Diagnosis not present

## 2020-03-08 DIAGNOSIS — C7A8 Other malignant neuroendocrine tumors: Secondary | ICD-10-CM | POA: Diagnosis not present

## 2020-03-08 DIAGNOSIS — R918 Other nonspecific abnormal finding of lung field: Secondary | ICD-10-CM | POA: Diagnosis not present

## 2020-03-08 MED ORDER — COPPER CU 64 DOTATATE 1 MCI/ML IV SOLN
4.0000 | Freq: Once | INTRAVENOUS | Status: AC
  Administered 2020-03-08: 4.3 via INTRAVENOUS

## 2020-03-09 ENCOUNTER — Inpatient Hospital Stay (HOSPITAL_BASED_OUTPATIENT_CLINIC_OR_DEPARTMENT_OTHER): Payer: Medicare Other | Admitting: Hematology & Oncology

## 2020-03-09 ENCOUNTER — Inpatient Hospital Stay: Payer: Medicare Other

## 2020-03-09 ENCOUNTER — Telehealth: Payer: Self-pay

## 2020-03-09 VITALS — BP 115/66 | HR 96 | Temp 98.2°F | Resp 18 | Wt 156.0 lb

## 2020-03-09 DIAGNOSIS — C7951 Secondary malignant neoplasm of bone: Secondary | ICD-10-CM

## 2020-03-09 DIAGNOSIS — M79605 Pain in left leg: Secondary | ICD-10-CM | POA: Diagnosis not present

## 2020-03-09 DIAGNOSIS — C7A8 Other malignant neuroendocrine tumors: Secondary | ICD-10-CM | POA: Diagnosis not present

## 2020-03-09 DIAGNOSIS — Z5111 Encounter for antineoplastic chemotherapy: Secondary | ICD-10-CM | POA: Diagnosis not present

## 2020-03-09 DIAGNOSIS — N390 Urinary tract infection, site not specified: Secondary | ICD-10-CM

## 2020-03-09 DIAGNOSIS — C61 Malignant neoplasm of prostate: Secondary | ICD-10-CM

## 2020-03-09 DIAGNOSIS — R319 Hematuria, unspecified: Secondary | ICD-10-CM

## 2020-03-09 DIAGNOSIS — D508 Other iron deficiency anemias: Secondary | ICD-10-CM | POA: Diagnosis not present

## 2020-03-09 DIAGNOSIS — D500 Iron deficiency anemia secondary to blood loss (chronic): Secondary | ICD-10-CM

## 2020-03-09 LAB — CBC WITH DIFFERENTIAL (CANCER CENTER ONLY)
Abs Immature Granulocytes: 0.02 10*3/uL (ref 0.00–0.07)
Basophils Absolute: 0 10*3/uL (ref 0.0–0.1)
Basophils Relative: 1 %
Eosinophils Absolute: 0.3 10*3/uL (ref 0.0–0.5)
Eosinophils Relative: 7 %
HCT: 28.9 % — ABNORMAL LOW (ref 39.0–52.0)
Hemoglobin: 9.4 g/dL — ABNORMAL LOW (ref 13.0–17.0)
Immature Granulocytes: 1 %
Lymphocytes Relative: 19 %
Lymphs Abs: 0.8 10*3/uL (ref 0.7–4.0)
MCH: 33 pg (ref 26.0–34.0)
MCHC: 32.5 g/dL (ref 30.0–36.0)
MCV: 101.4 fL — ABNORMAL HIGH (ref 80.0–100.0)
Monocytes Absolute: 0.6 10*3/uL (ref 0.1–1.0)
Monocytes Relative: 12 %
Neutro Abs: 2.7 10*3/uL (ref 1.7–7.7)
Neutrophils Relative %: 60 %
Platelet Count: 364 10*3/uL (ref 150–400)
RBC: 2.85 MIL/uL — ABNORMAL LOW (ref 4.22–5.81)
RDW: 15.5 % (ref 11.5–15.5)
WBC Count: 4.4 10*3/uL (ref 4.0–10.5)
nRBC: 0 % (ref 0.0–0.2)

## 2020-03-09 LAB — URINALYSIS, COMPLETE (UACMP) WITH MICROSCOPIC
Bilirubin Urine: NEGATIVE
Glucose, UA: NEGATIVE mg/dL
Hgb urine dipstick: NEGATIVE
Ketones, ur: NEGATIVE mg/dL
Leukocytes,Ua: NEGATIVE
Nitrite: NEGATIVE
Protein, ur: NEGATIVE mg/dL
Specific Gravity, Urine: 1.01 (ref 1.005–1.030)
Squamous Epithelial / HPF: NONE SEEN (ref 0–5)
pH: 5 (ref 5.0–8.0)

## 2020-03-09 LAB — CMP (CANCER CENTER ONLY)
ALT: 17 U/L (ref 0–44)
AST: 20 U/L (ref 15–41)
Albumin: 4.2 g/dL (ref 3.5–5.0)
Alkaline Phosphatase: 50 U/L (ref 38–126)
Anion gap: 9 (ref 5–15)
BUN: 20 mg/dL (ref 8–23)
CO2: 23 mmol/L (ref 22–32)
Calcium: 9.1 mg/dL (ref 8.9–10.3)
Chloride: 104 mmol/L (ref 98–111)
Creatinine: 1.5 mg/dL — ABNORMAL HIGH (ref 0.61–1.24)
GFR, Estimated: 49 mL/min — ABNORMAL LOW (ref >60)
Glucose, Bld: 177 mg/dL — ABNORMAL HIGH (ref 70–99)
Potassium: 3.8 mmol/L (ref 3.5–5.1)
Sodium: 136 mmol/L (ref 135–145)
Total Bilirubin: 0.3 mg/dL (ref 0.3–1.2)
Total Protein: 6.5 g/dL (ref 6.5–8.1)

## 2020-03-09 LAB — IRON AND TIBC
Iron: 61 ug/dL (ref 42–163)
Saturation Ratios: 24 % (ref 20–55)
TIBC: 258 ug/dL (ref 202–409)
UIBC: 196 ug/dL (ref 117–376)

## 2020-03-09 LAB — LACTATE DEHYDROGENASE: LDH: 159 U/L (ref 98–192)

## 2020-03-09 LAB — FERRITIN: Ferritin: 478 ng/mL — ABNORMAL HIGH (ref 24–336)

## 2020-03-09 MED ORDER — SODIUM CHLORIDE 0.9 % IV SOLN
3.2000 mg/m2 | Freq: Once | INTRAVENOUS | Status: AC
  Administered 2020-03-09: 5.8 mg via INTRAVENOUS
  Filled 2020-03-09: qty 11.6

## 2020-03-09 MED ORDER — PALONOSETRON HCL INJECTION 0.25 MG/5ML
INTRAVENOUS | Status: AC
  Filled 2020-03-09: qty 5

## 2020-03-09 MED ORDER — SODIUM CHLORIDE 0.9% FLUSH
10.0000 mL | INTRAVENOUS | Status: DC | PRN
  Administered 2020-03-09: 10 mL
  Filled 2020-03-09: qty 10

## 2020-03-09 MED ORDER — HEPARIN SOD (PORK) LOCK FLUSH 100 UNIT/ML IV SOLN
500.0000 [IU] | Freq: Once | INTRAVENOUS | Status: AC | PRN
  Administered 2020-03-09: 500 [IU]
  Filled 2020-03-09: qty 5

## 2020-03-09 MED ORDER — SODIUM CHLORIDE 0.9 % IV SOLN
10.0000 mg | Freq: Once | INTRAVENOUS | Status: AC
  Administered 2020-03-09: 10 mg via INTRAVENOUS
  Filled 2020-03-09: qty 10

## 2020-03-09 MED ORDER — PALONOSETRON HCL INJECTION 0.25 MG/5ML
0.2500 mg | Freq: Once | INTRAVENOUS | Status: AC
  Administered 2020-03-09: 0.25 mg via INTRAVENOUS

## 2020-03-09 MED ORDER — SODIUM CHLORIDE 0.9 % IV SOLN
Freq: Once | INTRAVENOUS | Status: AC
  Filled 2020-03-09: qty 250

## 2020-03-09 NOTE — Telephone Encounter (Addendum)
  This information provided to patient by Dr Marin Olp during today's office visit. dph   ----- Message from [name], MD sent at 03/09/2020  6:54 AM EST ----- Call - the scan looks stable.  Nothing new and nothing seems to be growing!!  [place] job!! [name]

## 2020-03-09 NOTE — Patient Instructions (Signed)
Lurbinectedin Injection What is this medicine? LURBINECTEDIN (LOOR bin EK te din) is a chemotherapy drug. This medicine is used to treat lung cancer. This medicine may be used for other purposes; ask your health care provider or pharmacist if you have questions. COMMON BRAND NAME(S): ZEPZELCA What should I tell my health care provider before I take this medicine? They need to know if you have any of these conditions:  infection (especially a viral infection such as chickenpox, cold sores, or herpes)  liver disease  low blood counts, like white cells, platelets, or red blood cells  an unusual or allergic reaction to lurbinectedin, other medicines, foods, dyes or preservatives  pregnant or trying to get pregnant  breast-feeding How should I use this medicine? This medicine is for infusion into a vein. It is given by a healthcare professional in a hospital or clinic setting. Talk to your pediatrician about the use of this medicine in children. Special care may be needed. Overdosage: If you think you have taken too much of this medicine contact a poison control center or emergency room at once. NOTE: This medicine is only for you. Do not share this medicine with others. What if I miss a dose? Keep appointments for follow-up doses. It is important not to miss your dose. Call your doctor or health care professional if you are unable to keep an appointment. What may interact with this medicine?  certain antibiotics like erythromycin or clarithromycin  certain antivirals for HIV or hepatitis  certain medicines for fungal infections like ketoconazole, itraconazole, or posaconazole  certain medicines for seizures like carbamazepine, phenobarbital, phenytoin  grapefruit or grapefruit juice  St. John's Wort This list may not describe all possible interactions. Give your health care provider a list of all the medicines, herbs, non-prescription drugs, or dietary supplements you use. Also  tell them if you smoke, drink alcohol, or use illegal drugs. Some items may interact with your medicine. What should I watch for while using this medicine? Your condition will be monitored carefully while you are receiving this medicine. Do not become pregnant while taking this medicine or for 6 months after stopping it. Women should inform their health care professional if they wish to become pregnant or think they might be pregnant. Men should not father a child while taking this medicine and for 4 months after stopping it. There is potential for serious side effects to an unborn child. Talk to your health care professional for more information. Do not breast-feed a child while taking this medicine or for 2 weeks after stopping it. Call your health care professional for advice if you get a fever, chills, or sore throat, or other symptoms of a cold or flu. Do not treat yourself. This medicine decreases your body's ability to fight infections. Try to avoid being around people who are sick. Avoid taking medicines that contain aspirin, acetaminophen, ibuprofen, naproxen, or ketoprofen unless instructed by your health care professional. These medicines may hide a fever. Be careful brushing or flossing your teeth or using a toothpick because you may get an infection or bleed more easily. If you have any dental work done, tell your dentist you are receiving this medicine. What side effects may I notice from receiving this medicine? Side effects that you should report to your doctor or health care professional as soon as possible:  allergic reactions like skin rash, itching or hives; swelling of the face, lips, or tongue  chest pain  nausea, vomiting  signs and symptoms of  bleeding such as bloody or black, tarry stools; red or dark-brown urine; spitting up blood or brown material that looks like coffee grounds; red spots on the skin; unusual bruising or bleeding from the eyes, gums, or nose  signs and  symptoms of infection like fever; chills; cough; sore throat; pain or trouble passing urine  signs and symptoms of liver injury like dark yellow or brown urine; general ill feeling or flu-like symptoms; light-colored stools; loss of appetite; nausea; right upper belly pain; unusually weak or tired; yellowing of the eyes or skin Side effects that usually do not require medical attention (report these to your doctor or health care professional if they continue or are bothersome):  changes in taste  constipation  diarrhea  loss of appetite  muscle pain  pain, tingling, numbness in the hands or feet  signs and symptoms of high blood sugar such as being more thirsty or hungry or having to urinate more than normal. You may also feel very tired or have blurry vision.  signs and symptoms of low magnesium like muscle cramps; muscle pain; muscle weakness; tremors; seizures; or fast, irregular heartbeat  signs and symptoms of low red blood cells or anemia such as unusually weak or tired; feeling faint or lightheaded; falls; breathing problems  stomach pain This list may not describe all possible side effects. Call your doctor for medical advice about side effects. You may report side effects to FDA at 1-800-FDA-1088. Where should I keep my medicine? This medicine is given in a hospital or clinic and will not be stored at home. NOTE: This sheet is a summary. It may not cover all possible information. If you have questions about this medicine, talk to your doctor, pharmacist, or health care provider.  2021 Elsevier/Gold Standard (2018-07-03 13:00:33)

## 2020-03-09 NOTE — Progress Notes (Signed)
Hematology and Oncology Follow Up Visit  [name] [id number] 1945/10/18 75 y.o. 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 Iron def anemia -- malabsorption  Past Therapy: Zytiga 1000 mg by mouth daily - discontinued on 08/15/2016 Xtandi 160 mg po q day - start on 08/15/2016 - discontinued Radium-223 therapy -- s/p cycle #6 Palliative radiation to the right sacrum Lutathera - s/p cycle 4/4 --last dose on 07/11/2017 Carbo/VP-16/Tecentriq --  S/p cycle #5 Keytruda q 6 week -- Maintanence -- s/p cycle #3 - changed from 3 to 6 week on 06/30/2018 Taxotere/Keytruda -- start on 09/17/2018 -- s/p cycle #10 -- d/c due to progression  Current Therapy:        Lurbinectedin -- started on 04/29/2019, s/p cycle #15 Xgeva 120 mg subcutaneous Q3 months - due in 04/2020 Lupron 22 mg IM every 3 months - due in 04/2020  IV iron as indicated    Interim History:  [name] is here today for follow-up.  As always, he is doing fairly well.  We actually did do a nuclear medicine scan on him.  This was the new neuroendocrine scan.  This was done on 03/08/2020.  The scan did not show any evidence of disease progression.  He had no new areas of disease.  Everything looked pretty stable.  His complaint has been some pain in his right leg mostly.  There is occasional pain in the left leg.  I do not know if this might be from something going on with his lower back.  He has had back surgery before.  We talked about doing an MRI.  He would like to hold off on this for right now.  He has had no problems with cough or shortness of breath.  He has had no chest wall pain.  He has had no fever.  He has had no bleeding.  There is been no issues with bowels or bladder.  We did do a urinalysis on him today.  Urinalysis was unremarkable.  He is eating well.  His blood sugars are on the higher side but this  really has not been a problem.  Overall, I would say his performance status is ECOG 1.      Medications:  Allergies as of 03/09/2020      Reactions   Codeine Nausea And Vomiting   Hydrocodone Nausea And Vomiting      Medication List       Accurate as of March 09, 2020 10:29 AM. If you have any questions, ask your nurse or doctor.        STOP taking these medications   ciprofloxacin 500 MG tablet Commonly known as: Cipro Stopped by: [name], MD   nitroGLYCERIN 0.4 MG SL tablet Commonly known as: NITROSTAT Stopped by: [name], MD   ondansetron 8 MG tablet Commonly known as: ZOFRAN Stopped by: [name], MD   sulfamethoxazole-trimethoprim 800-160 MG tablet Commonly known as: BACTRIM DS Stopped by: [name], MD     TAKE these medications   aspirin 81 MG tablet Take 81 mg by mouth daily.   CALCIUM-IRON-VIT D-VIT K PO Take 1 tablet by mouth 2 (two) times daily.   dexamethasone 4 MG tablet Commonly known as: DECADRON TAKE 2 TABLETS (8 MG TOTAL) BY MOUTH 2 (TWO) TIMES DAILY FOR 5 DAYS. START 2 DAYS BEFORE EACH CHEMOTHERAPY. What changed: See the new instructions.  gabapentin 300 MG capsule Commonly known as: NEURONTIN TAKE 1 CAPSULE BY MOUTH TWICE A DAY THEN TAKE 2 CAPSULES AT BEDTIME   Generlac 10 GM/15ML Soln Generic drug: lactulose (encephalopathy) SMARTSIG:45 Milliliter(s) By Mouth Daily PRN   guaiFENesin 600 MG 12 hr tablet Commonly known as: MUCINEX Take 400 mg by mouth 2 (two) times daily.   isosorbide mononitrate 30 MG 24 hr tablet Commonly known as: IMDUR Take 30 mg by mouth daily.   lidocaine-prilocaine cream Commonly known as: EMLA   OVER THE COUNTER MEDICATION every morning. Juice Plus -- 8 capsule of each the garden, vineyard, and orchard twice a day.   Oyster Shell Calcium/D 250-125 MG-UNIT Tabs Take by mouth daily.   pantoprazole 40 MG tablet Commonly known as: PROTONIX TAKE 1 TABLET (40 MG TOTAL) BY  MOUTH AT BEDTIME.   prochlorperazine 10 MG tablet Commonly known as: COMPAZINE TAKE 1 TABLET (10 MG TOTAL) BY MOUTH EVERY 6 (SIX) HOURS AS NEEDED (NAUSEA OR VOMITING).   senna-docusate 8.6-50 MG tablet Commonly known as: Senokot-S Take 2-4 tablets by mouth at bedtime.   sucralfate 1 GM/10ML suspension Commonly known as: Carafate Take 10 mLs (1 g total) by mouth 4 (four) times daily -  with meals and at bedtime. What changed:   when to take this  reasons to take this   Zetia 10 MG tablet Generic drug: ezetimibe Take 10 mg by mouth daily.   Zinc 50 MG Caps Take 50 mg by mouth daily.       Allergies:  Allergies  Allergen Reactions  . Codeine Nausea And Vomiting  . Hydrocodone Nausea And Vomiting    Past Medical History, Surgical history, Social history, and Family History were reviewed and updated.  Review of Systems: Review of Systems  Constitutional: Negative.   HENT: Negative.   Eyes: Negative.   Respiratory: Negative.   Cardiovascular: Negative.   Gastrointestinal: Negative.   Genitourinary: Negative.   Musculoskeletal: Negative.   Skin: Negative.   Neurological: Negative.   Endo/Heme/Allergies: Negative.   Psychiatric/Behavioral: Negative.      Physical Exam:  weight is 156 lb (70.8 kg). His oral temperature is 98.2 F (36.8 C). His blood pressure is 115/66 and his pulse is 96. His respiration is 18 and oxygen saturation is 100%.   Wt Readings from Last 3 Encounters:  03/09/20 156 lb (70.8 kg)  02/17/20 156 lb (70.8 kg)  01/27/20 154 lb (69.9 kg)    Physical Exam Vitals reviewed.  HENT:     Head: Normocephalic and atraumatic.  Eyes:     Pupils: Pupils are equal, round, and reactive to light.  Cardiovascular:     Rate and Rhythm: Normal rate and regular rhythm.     Heart sounds: Normal heart sounds.  Pulmonary:     Effort: Pulmonary effort is normal.     Breath sounds: Normal breath sounds.  Abdominal:     General: Bowel sounds are  normal.     Palpations: Abdomen is soft.  Musculoskeletal:        General: No tenderness or deformity. Normal range of motion.     Cervical back: Normal range of motion.     Comments: Extremities shows the brace on the right lower leg.  There is a little bit of muscle atrophy.  He has decent strength in both legs.  He has decent range of motion of his joints.  Pulses are intact in the lower extremities.  Lymphadenopathy:     Cervical: No cervical adenopathy.  Skin:    General: Skin is warm and dry.     Findings: No erythema or rash.  Neurological:     Mental Status: He is alert and oriented to person, place, and time.  Psychiatric:        Behavior: Behavior normal.        Thought Content: Thought content normal.        Judgment: Judgment normal.      Lab Results  Component Value Date   WBC 4.4 03/09/2020   HGB 9.4 (L) 03/09/2020   HCT 28.9 (L) 03/09/2020   MCV 101.4 (H) 03/09/2020   PLT 364 03/09/2020   Lab Results  Component Value Date   FERRITIN 488 (H) 02/17/2020   IRON 78 02/17/2020   TIBC 241 02/17/2020   UIBC 163 02/17/2020   IRONPCTSAT 32 02/17/2020   Lab Results  Component Value Date   RETICCTPCT 1.6 04/08/2019   RBC 2.85 (L) 03/09/2020   No results found for: KPAFRELGTCHN, LAMBDASER, KAPLAMBRATIO No results found for: IGGSERUM, IGA, IGMSERUM No results found for: Kathrynn Ducking, MSPIKE, SPEI   Chemistry      Component Value Date/Time   NA 136 03/09/2020 0930   NA 144 12/14/2016 1159   NA 140 10/22/2016 1129   K 3.8 03/09/2020 0930   K 3.5 12/14/2016 1159   K 4.3 10/22/2016 1129   CL 104 03/09/2020 0930   CL 103 12/14/2016 1159   CO2 23 03/09/2020 0930   CO2 30 12/14/2016 1159   CO2 27 10/22/2016 1129   BUN 20 03/09/2020 0930   BUN 16 12/14/2016 1159   BUN 14.0 10/22/2016 1129   CREATININE 1.50 (H) 03/09/2020 0930   CREATININE 1.3 (H) 12/14/2016 1159   CREATININE 1.1 10/22/2016 1129      Component  Value Date/Time   CALCIUM 9.1 03/09/2020 0930   CALCIUM 10.0 12/14/2016 1159   CALCIUM 10.4 10/22/2016 1129   ALKPHOS 50 03/09/2020 0930   ALKPHOS 48 12/14/2016 1159   ALKPHOS 60 10/22/2016 1129   AST 20 03/09/2020 0930   AST 16 10/22/2016 1129   ALT 17 03/09/2020 0930   ALT 23 12/14/2016 1159   ALT 14 10/22/2016 1129   BILITOT 0.3 03/09/2020 0930   BILITOT 0.31 10/22/2016 1129       Impression and Plan: Mr. Uecker is 75 yo caucasian gentleman with metastatic neuroendocrine carcinoma of unknown  primary.  For right now, we will continue him on the Lurbinectidin.  I am glad that the nuclear medicine scan showed everything was stable.  He is tolerating treatment nicely.  His quality of life is doing well so I just do not believe that we have to make a change in his protocol.  There really is very little else that we can use on him right now.  We will plan for his follow-up in another 3 weeks.    [name], MD 2/23/202210:29 AM

## 2020-03-09 NOTE — Patient Instructions (Signed)

## 2020-03-10 ENCOUNTER — Telehealth: Payer: Self-pay

## 2020-03-10 NOTE — Telephone Encounter (Signed)
S/w pt per 03/10/20 los, 3/16appt sch with sarah and pt is aware   [name]

## 2020-03-11 ENCOUNTER — Other Ambulatory Visit: Payer: Self-pay | Admitting: Hematology & Oncology

## 2020-03-11 DIAGNOSIS — C7A8 Other malignant neuroendocrine tumors: Secondary | ICD-10-CM

## 2020-03-14 LAB — CHROMOGRANIN A: Chromogranin A (ng/mL): 772.2 ng/mL — ABNORMAL HIGH (ref 0.0–101.8)

## 2020-03-19 ENCOUNTER — Other Ambulatory Visit: Payer: Self-pay

## 2020-03-19 ENCOUNTER — Ambulatory Visit (HOSPITAL_BASED_OUTPATIENT_CLINIC_OR_DEPARTMENT_OTHER)
Admission: RE | Admit: 2020-03-19 | Discharge: 2020-03-19 | Disposition: A | Discharge status: Home / Self Care | Payer: Medicare Other | Source: Ambulatory Visit | Attending: Hematology & Oncology | Admitting: Hematology & Oncology

## 2020-03-19 DIAGNOSIS — C7A8 Other malignant neuroendocrine tumors: Secondary | ICD-10-CM | POA: Insufficient documentation

## 2020-03-19 DIAGNOSIS — M545 Low back pain, unspecified: Secondary | ICD-10-CM | POA: Diagnosis not present

## 2020-03-19 MED ORDER — GADOBUTROL 1 MMOL/ML IV SOLN
7.0000 mL | Freq: Once | INTRAVENOUS | Status: AC | PRN
  Administered 2020-03-19: 7 mL via INTRAVENOUS

## 2020-03-21 ENCOUNTER — Other Ambulatory Visit: Payer: Self-pay | Admitting: Hematology & Oncology

## 2020-03-21 MED ORDER — DEXAMETHASONE 4 MG PO TABS: 20.0000 mg | ORAL_TABLET | Freq: Every day | ORAL | 2 refills | Status: DC

## 2020-03-21 MED FILL — DEXAMETHASONE 4 MG TABLET: 4 | 20 days supply | Qty: 100 | Fill #0

## 2020-03-24 ENCOUNTER — Other Ambulatory Visit: Payer: Self-pay | Admitting: Radiation Therapy

## 2020-03-24 ENCOUNTER — Ambulatory Visit
Admission: RE | Admit: 2020-03-24 | Discharge: 2020-03-24 | Disposition: A | Discharge status: Home / Self Care | Payer: Medicare Other | Source: Ambulatory Visit | Attending: Radiation Oncology | Admitting: Radiation Oncology

## 2020-03-24 ENCOUNTER — Encounter: Payer: Self-pay | Admitting: Radiation Oncology

## 2020-03-24 VITALS — BP 143/78 | HR 92 | Temp 96.8°F | Resp 18 | Ht 68.0 in | Wt 156.0 lb

## 2020-03-24 DIAGNOSIS — G893 Neoplasm related pain (acute) (chronic): Secondary | ICD-10-CM | POA: Diagnosis not present

## 2020-03-24 DIAGNOSIS — M47819 Spondylosis without myelopathy or radiculopathy, site unspecified: Secondary | ICD-10-CM | POA: Diagnosis not present

## 2020-03-24 DIAGNOSIS — M79651 Pain in right thigh: Secondary | ICD-10-CM | POA: Diagnosis not present

## 2020-03-24 DIAGNOSIS — R918 Other nonspecific abnormal finding of lung field: Secondary | ICD-10-CM | POA: Diagnosis not present

## 2020-03-24 DIAGNOSIS — C7951 Secondary malignant neoplasm of bone: Secondary | ICD-10-CM | POA: Diagnosis not present

## 2020-03-24 DIAGNOSIS — C7B8 Other secondary neuroendocrine tumors: Secondary | ICD-10-CM

## 2020-03-24 DIAGNOSIS — C7A1 Malignant poorly differentiated neuroendocrine tumors: Secondary | ICD-10-CM | POA: Insufficient documentation

## 2020-03-24 DIAGNOSIS — Z7982 Long term (current) use of aspirin: Secondary | ICD-10-CM | POA: Insufficient documentation

## 2020-03-24 DIAGNOSIS — Z7952 Long term (current) use of systemic steroids: Secondary | ICD-10-CM | POA: Insufficient documentation

## 2020-03-24 DIAGNOSIS — C61 Malignant neoplasm of prostate: Secondary | ICD-10-CM | POA: Diagnosis not present

## 2020-03-24 NOTE — Progress Notes (Signed)
Radiation Oncology         [phone] ________________________________  Name: [name] MRN: [id number]  Date: 03/24/2020  DOB: 11-10-1945  Re-Evaluation Note  CC: Nicoletta Dress, MD  [name], MD    ICD-10-CM   1. Cancer, metastatic to bone ([place])  C79.51   2. 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  PREVIOUS RADIATION TREATMENT  Date: 05/10/2014                      DOB: [birth year]  End of Treatment Note #1  Diagnosis:   Metastatic prostate cancer to the right sacrum    Indication for treatment:  Pain and increased uptake in the right sacrum on bone scan       Radiation treatment dates:   April 7 through May 06, 2014  Site/dose:   Right sacrum, 30 gray in 10 fractions  Beams/energy:   3-D conformal using 4 field set up   End of Treatment Note #2  Diagnosis:   Metastatic castrate resistant prostate cancer     Indication for treatment:  Palliative     Radiation treatment dates:   10/26/15 - 11/09/15  Site/dose:   Right sacro-iliac region: 30 Gy in 5 fractions   Narrative:  The patient returns today for a re-consultation visit. He is well known to me and was first seen in consultation in 2015. He was last seen on 11/21/2016, at which time he had multiple pulmonary nodules throughout bilateral lungs. They were small but could have been metastatic disease, but were below the resolution of PET scan. Treatment was pending review of repeat PET scan.  The patient has undergone several serial PET scans, all of which have remained relatively stable. He has since undergone 15 cycles of chemotherapy with Lurbinectedin beginning on 04/29/2019, which he continues on, under the care of Dr. Marin Olp.  Most recent imaging studies are as follows: 1. PET scan on 03/08/2020 that showed no clear evidence of disease progression. However, there was mild increase in  radiotracer activity in several of the somatostatin receptor metastasis without increase in size. There were still noted to be multiple bilateral pulmonary nodules with radiotracer activity consistent with well differentiated neuroendocrine tumor and mild increased radiotracer activity of several nodules. However, there was no interval change in size. Lesion within the right aspect of the central canal at the S1 vertebral body showed mild increase in radiotracer activity but was considered stable. There was no evidence of metastatic disease in the abdomen or pelvis. 2. MRI of lumbar spine on 03/19/2020 to evaluate one month history of right leg leg pain. Results showed a partially visualized abnormal bone lesion involving the right S1, S2, and S3 vertebral bodies with epidural tumoral encasing the right S1 nerve root and measured approximately 12 x 13 mm in axial dimension, consistent with metastatic disease. The epidural component encasing the right S1 nerve root was new compared to 01/29/2017.  On review of systems, he reports pain in his right posterior thigh region. He denies changes in his right lower extremity.  He has right foot drop and wears a brace for this issue.  He ambulates with the assistance of a cane but today with the assistance of a walker in light of the distance he denies any numbness or tingling in his lower extremities but has noticed approximately 4 times per day a very sharp intense shooting  type pain in the right foot which lasts for several seconds..  In light of the recent MRI findings noted at S1 and the patient's pain in the right leg he is now referred to radiation oncology for consideration for additional treatment.  Patient denies any other areas of pain at this time.  He denies any significant cough or hemoptysis.  He denies any breathing difficulties.  Allergies:  is allergic to codeine and hydrocodone.  Meds: Current Outpatient Medications  Medication Sig Dispense  Refill  . aspirin 81 MG tablet Take 81 mg by mouth daily.    . Calcium Carbonate-Vitamin D (OYSTER SHELL CALCIUM/D) 250-125 MG-UNIT TABS Take by mouth daily.     Marland Kitchen CALCIUM-IRON-VIT D-VIT K PO Take 1 tablet by mouth 2 (two) times daily.    Marland Kitchen dexamethasone (DECADRON) 4 MG tablet Take 5 tablets (20 mg total) by mouth daily. Make sure you take the Decadron with food!!!! 100 tablet 2  . gabapentin (NEURONTIN) 300 MG capsule TAKE 1 CAPSULE BY MOUTH TWICE A DAY THEN TAKE 2 CAPSULES AT BEDTIME 120 capsule 2  . isosorbide mononitrate (IMDUR) 30 MG 24 hr tablet Take 30 mg by mouth daily.    Marland Kitchen OVER THE COUNTER MEDICATION every morning. Juice Plus -- 8 capsule of each the garden, vineyard, and orchard twice a day.    . pantoprazole (PROTONIX) 40 MG tablet TAKE 1 TABLET (40 MG TOTAL) BY MOUTH AT BEDTIME. 30 tablet 6  . prochlorperazine (COMPAZINE) 10 MG tablet TAKE 1 TABLET (10 MG TOTAL) BY MOUTH EVERY 6 (SIX) HOURS AS NEEDED (NAUSEA OR VOMITING). 30 tablet 1  . senna-docusate (SENOKOT-S) 8.6-50 MG tablet Take 2-4 tablets by mouth at bedtime.    Marland Kitchen ZETIA 10 MG tablet Take 10 mg by mouth daily.     . Zinc 50 MG CAPS Take 50 mg by mouth daily.    Marland Kitchen GENERLAC 10 GM/15ML SOLN SMARTSIG:45 Milliliter(s) By Mouth Daily PRN (Patient not taking: Reported on 03/24/2020)    . lidocaine-prilocaine (EMLA) cream  (Patient not taking: Reported on 03/24/2020)     No current facility-administered medications for this encounter.   Facility-Administered Medications Ordered in Other Encounters  Medication Dose Route Frequency Provider Last Rate Last Admin  . octreotide (SANDOSTATIN LAR) IM injection 30 mg  30 mg Intramuscular Once [name], MD        Physical Findings: The patient is in no acute distress. Patient is alert and oriented.  height is 5\' 8"  (1.727 m) and weight is 156 lb (70.8 kg). His temporal temperature is 96.8 F (36 C) (abnormal). His blood pressure is 143/78 (abnormal) and his pulse is 92. His  respiration is 18 and oxygen saturation is 100%.  No significant changes. Lungs are clear to auscultation bilaterally. Heart has regular rate and rhythm. No palpable cervical, supraclavicular, or axillary adenopathy. Abdomen soft, non-tender, normal bowel sounds.  Strength in the left lower extremity shows 5 out of 5 in the proximal and distal muscle groups.  Proximal muscle strength is 5 out of 5 in the right upper leg.  He is unable to move his toes and has no ability to dorsiflex his right foot.  He has a foot brace in place today.  Lab Findings: Lab Results  Component Value Date   WBC 4.4 03/09/2020   HGB 9.4 (L) 03/09/2020   HCT 28.9 (L) 03/09/2020   MCV 101.4 (H) 03/09/2020   PLT 364 03/09/2020    Radiographic Findings: MR Lumbar Spine W  Wo Contrast  Result Date: 03/20/2020 CLINICAL DATA:  Bilateral leg pain for 1 month. EXAM: MRI LUMBAR SPINE WITHOUT AND WITH CONTRAST TECHNIQUE: Multiplanar and multiecho pulse sequences of the lumbar spine were obtained without and with intravenous contrast. CONTRAST:  11mL GADAVIST GADOBUTROL 1 MMOL/ML IV SOLN COMPARISON:  01/29/2017 FINDINGS: Segmentation:  Standard. Alignment:  Physiologic. Vertebrae: No acute fracture. No discitis or osteomyelitis. Partially visualized abnormal bone lesion involving the right S1, S2 and S3 vertebral bodies with epidural tumoral encasing the right S1 nerve root measuring approximately 12 x 13 mm in axial dimension. Conus medullaris and cauda equina: Conus extends to the L1 level. Conus and cauda equina appear normal. Paraspinal and other soft tissues: No acute paraspinal abnormality. Mild osteoarthritis of bilateral SI joints. Disc levels: Disc spaces: Disc desiccation at L5-S1. Disc heights are maintained. T12-L1: No significant disc bulge. No evidence of neural foraminal stenosis. No central canal stenosis. L1-L2: Minimal broad-based disc bulge. No evidence of neural foraminal stenosis. No central canal stenosis. L2-L3: No  significant disc bulge. No evidence of neural foraminal stenosis. No central canal stenosis. Mild bilateral facet arthropathy. L3-L4: Mild broad-based disc bulge. Mild bilateral facet arthropathy. No evidence of neural foraminal stenosis. No central canal stenosis. L4-L5: Mild broad-based disc bulge. Moderate bilateral facet arthropathy. No evidence of neural foraminal stenosis. No central canal stenosis. L5-S1: Broad-based disc bulge with a central annular fissure. Partially visualized abnormal bone lesion involving the right S1, S2 and S3 vertebral bodies with epidural tumoral encasing the right S1 nerve root measuring approximately 12 x 13 mm in axial dimension. No evidence of neural foraminal stenosis. No central canal stenosis. IMPRESSION: Partially visualized abnormal bone lesion involving the right S1, S2 and S3 vertebral bodies with epidural tumoral encasing the right S1 nerve root measuring approximately 12 x 13 mm in axial dimension consistent with metastatic disease. The epidural component encasing the right S1 nerve root is new compared with 01/29/2017. Electronically Signed   By: Kathreen Devoid   On: 03/20/2020 08:04   NM PET (CU-64 DETECTNET)SKULL TO MID THIGH  Result Date: 03/08/2020 CLINICAL DATA:  75 year old male with pulmonary metastatic neuroendocrine tumor. Unknown primary. Prior peptide receptor radiotherapy. Additional history of prostate cancer. EXAM: NUCLEAR MEDICINE PET SKULL BASE TO THIGH TECHNIQUE: 4.3 mCi copper 20 DOTATATE was injected intravenously. Full-ring PET imaging was performed from the skull base to thigh after the radiotracer. CT data was obtained and used for attenuation correction and anatomic localization. COMPARISON:  DOTATATE PET scan 02/21/2019 FINDINGS: NECK No radiotracer activity in neck lymph nodes. Incidental CT findings: None CHEST Bilobed nodule in the superior segment LEFT lower lobe measures 20 mm in maximum dimension compared with 19 mm. No significant change  in radiotracer activity (SUV max equal 11.5 compared SUV max equal 8.6). LEFT upper lobe nodule measures 8 mm (image 71) compared with 8 mm with SUV max equal 4.0 compared SUV max equal 2.1 Large RIGHT lower lobe nodule in the azygoesophageal recess measures 24 mm compared to 25 mm with SUV max equal 7.8 compared SUV max equal 8.2. No new pulmonary nodules are present. Incidental CT finding:None ABDOMEN/PELVIS No abnormal activity in liver. No lymphadenopathy in the abdomen pelvis. No mesenteric implants. Physiologic activity noted in the liver, spleen, adrenal glands and kidneys. Incidental CT findings:Solitary lesion within the central canal of the S1 vertebral body on the RIGHT with SUV max equal 11.0 (image 207). This lesion similar comparison exam (SUV max equal 6.8. SKELETON Again demonstrated lytic and sclerotic  lesion within the RIGHT sacral ala without radiotracer activity. No focal score skeletal lesion. Incidental CT findings:None IMPRESSION: 1. No clear evidence of disease progression. Mild increase in radiotracer activity of several of the somatostatin receptor metastasis. No increase in size. 2. Again demonstrated multiple bilateral pulmonary nodules with radiotracer activity consistent well differentiated neuroendocrine tumor. No interval change in size. Mild increased radiotracer activity of several nodules. 3. No evidence of metastatic disease in the abdomen pelvis. 4. Stable lesion within the RIGHT aspect of central canal the S1 vertebral body mild increase in radiotracer activity. Electronically Signed   By: Suzy Bouchard M.D.   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  MRI findings as documented above.  I reviewed the patient's previous radiation fields and the S to S3 area was treated on 2 separate occasions.  This did overlap somewhat with the S1 area.  I  discussed with the patient that to treat the S1 area would be with some overlap over his previous treatments and over the nerve regions could cause more neurologic issues.  Is also could cause weakening of the bone and compression fractures resulting in pain.  Given the patient's pain issues he is willing to reconsider retreatment in this situation.  Prior to proceeding with additional radiation therapy the patient's case will be presented at the multidisciplinary neurosurgical conference on Monday, March 14 for further evaluation.  This may also help to see if the patient may potentially be a candidate for additional surgery.  In August 2018 the patient underwent a right S1-S2 laminectomy for resection of tumor to decompress the right S1-S2 nerve roots.  Per discussion with patient this did result in significant improvement and clearance of his pain at that time.  Plan: Final details pending input from the neurosurgical conference early next week.  Total time spent in this encounter was 35 minutes which included reviewing the patient's most recent PET scans, MRI of lumbar spine, follow-ups, chemotherapy, physical examination, and documentation.  -----------------------------------  Blair Promise, PhD, MD  This document serves as a record of services personally performed by [name], MD. It was created on his behalf by Clerance Lav, a trained medical scribe. The creation of this record is based on the scribe's personal observations and the provider's statements to them. This document has been checked and approved by the attending provider.

## 2020-03-24 NOTE — Progress Notes (Signed)
See md note for nursing evaluation 

## 2020-03-24 NOTE — Progress Notes (Signed)
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 Marin Olp  Past Therapy: Zytiga 1000 mg by mouth daily - discontinued on 08/15/2016 Xtandi 160 mg po q day - start on 08/15/2016 - discontinued Radium-223 therapy -- s/p cycle #6 Palliative radiation to the right sacrum Lutathera - s/p cycle 4/4 --last dose on 07/11/2017 Carbo/VP-16/Tecentriq -- S/p cycle #5 Keytruda q 6 week -- Maintanence -- s/p cycle #3 - changed from 3 to 6 week on 06/30/2018 Taxotere/Keytruda -- start on 09/17/2018 -- s/p cycle #10 -- d/c due to progression  Current Therapy: Lurbinectedin --started on 04/29/2019, s/p cycle #15 Xgeva 120 mg subcutaneous Q3 months - due in 04/2020 Lupron 22 mg IM every 3 months - due in 04/2020  IV iron as indicated   Patient's main complaints related to symptomatic tumor(s) are: Bilateral Leg Pain X 1 month  MRI Lumbar Spine w wo contrast   Pain on a scale of 0-10 is: none presently, but did experience pain down back of thigh originating in the buttocks.  Electrical shooting sensations to the right foot.    If Spine Met(s), symptoms, if any, include:  Bowel/Bladder retention or incontinence (please describe): Urinary incontinence, incomplete emptying.  Depending on chemo timing will experience diarrhea and constipation.  Numbness or weakness in extremities (please describe): right leg  Current Decadron regimen, if applicable: Decadron 20 mg daily with no further instructions to taper as of yet.  Ambulatory status? Walker? Wheelchair?: Rollator  SAFETY ISSUES:  Prior radiation? 04/23/2014-05/06/2014 Right Sacrum and again 10/26/2015-11/09/2015 SBRT right sacrum and Radium 223 Therapy  Pacemaker/ICD? no  Possible current pregnancy? no  Is the patient on methotrexate?  no  Additional Complaints /  other details:  Wife Freda Munro is present with him today.  Vitals:   03/24/20 1349  BP: (!) 143/78  Pulse: 92  Resp: 18  Temp: (!) 96.8 F (36 C)  TempSrc: Temporal  SpO2: 100%  Weight: 156 lb (70.8 kg)  Height: _0  (1.727 m)

## 2020-03-28 ENCOUNTER — Inpatient Hospital Stay: Payer: Medicare Other | Attending: Hematology & Oncology

## 2020-03-28 DIAGNOSIS — C61 Malignant neoplasm of prostate: Secondary | ICD-10-CM | POA: Insufficient documentation

## 2020-03-28 DIAGNOSIS — D509 Iron deficiency anemia, unspecified: Secondary | ICD-10-CM | POA: Insufficient documentation

## 2020-03-28 DIAGNOSIS — K5900 Constipation, unspecified: Secondary | ICD-10-CM | POA: Insufficient documentation

## 2020-03-28 DIAGNOSIS — Z885 Allergy status to narcotic agent status: Secondary | ICD-10-CM | POA: Insufficient documentation

## 2020-03-28 DIAGNOSIS — K909 Intestinal malabsorption, unspecified: Secondary | ICD-10-CM | POA: Insufficient documentation

## 2020-03-28 DIAGNOSIS — R0602 Shortness of breath: Secondary | ICD-10-CM | POA: Insufficient documentation

## 2020-03-28 DIAGNOSIS — Z79899 Other long term (current) drug therapy: Secondary | ICD-10-CM | POA: Insufficient documentation

## 2020-03-28 DIAGNOSIS — Z5111 Encounter for antineoplastic chemotherapy: Secondary | ICD-10-CM | POA: Insufficient documentation

## 2020-03-28 DIAGNOSIS — C7A8 Other malignant neuroendocrine tumors: Secondary | ICD-10-CM | POA: Insufficient documentation

## 2020-03-30 ENCOUNTER — Inpatient Hospital Stay: Payer: Medicare Other

## 2020-03-30 ENCOUNTER — Other Ambulatory Visit: Payer: Self-pay | Admitting: Cardiology

## 2020-03-30 ENCOUNTER — Encounter: Payer: Self-pay | Admitting: Family

## 2020-03-30 ENCOUNTER — Other Ambulatory Visit: Payer: Self-pay

## 2020-03-30 ENCOUNTER — Inpatient Hospital Stay (HOSPITAL_BASED_OUTPATIENT_CLINIC_OR_DEPARTMENT_OTHER): Payer: Medicare Other | Admitting: Family

## 2020-03-30 VITALS — BP 131/72 | HR 94 | Temp 98.3°F | Resp 18 | Ht 68.0 in | Wt 154.1 lb

## 2020-03-30 DIAGNOSIS — D509 Iron deficiency anemia, unspecified: Secondary | ICD-10-CM | POA: Diagnosis not present

## 2020-03-30 DIAGNOSIS — C7A8 Other malignant neuroendocrine tumors: Secondary | ICD-10-CM | POA: Diagnosis not present

## 2020-03-30 DIAGNOSIS — Z5111 Encounter for antineoplastic chemotherapy: Secondary | ICD-10-CM | POA: Diagnosis not present

## 2020-03-30 DIAGNOSIS — D500 Iron deficiency anemia secondary to blood loss (chronic): Secondary | ICD-10-CM | POA: Diagnosis not present

## 2020-03-30 DIAGNOSIS — R0602 Shortness of breath: Secondary | ICD-10-CM | POA: Diagnosis not present

## 2020-03-30 DIAGNOSIS — K909 Intestinal malabsorption, unspecified: Secondary | ICD-10-CM | POA: Diagnosis not present

## 2020-03-30 DIAGNOSIS — C61 Malignant neoplasm of prostate: Secondary | ICD-10-CM

## 2020-03-30 DIAGNOSIS — K5900 Constipation, unspecified: Secondary | ICD-10-CM | POA: Diagnosis not present

## 2020-03-30 DIAGNOSIS — C7951 Secondary malignant neoplasm of bone: Secondary | ICD-10-CM

## 2020-03-30 DIAGNOSIS — Z79899 Other long term (current) drug therapy: Secondary | ICD-10-CM | POA: Diagnosis not present

## 2020-03-30 DIAGNOSIS — Z885 Allergy status to narcotic agent status: Secondary | ICD-10-CM | POA: Diagnosis not present

## 2020-03-30 LAB — CBC WITH DIFFERENTIAL (CANCER CENTER ONLY)
Abs Immature Granulocytes: 0.42 10*3/uL — ABNORMAL HIGH (ref 0.00–0.07)
Basophils Absolute: 0 10*3/uL (ref 0.0–0.1)
Basophils Relative: 0 %
Eosinophils Absolute: 0 10*3/uL (ref 0.0–0.5)
Eosinophils Relative: 0 %
HCT: 30.3 % — ABNORMAL LOW (ref 39.0–52.0)
Hemoglobin: 10.1 g/dL — ABNORMAL LOW (ref 13.0–17.0)
Immature Granulocytes: 5 %
Lymphocytes Relative: 10 %
Lymphs Abs: 0.9 10*3/uL (ref 0.7–4.0)
MCH: 32.7 pg (ref 26.0–34.0)
MCHC: 33.3 g/dL (ref 30.0–36.0)
MCV: 98.1 fL (ref 80.0–100.0)
Monocytes Absolute: 1.1 10*3/uL — ABNORMAL HIGH (ref 0.1–1.0)
Monocytes Relative: 13 %
Neutro Abs: 6.2 10*3/uL (ref 1.7–7.7)
Neutrophils Relative %: 72 %
Platelet Count: 375 10*3/uL (ref 150–400)
RBC: 3.09 MIL/uL — ABNORMAL LOW (ref 4.22–5.81)
RDW: 15.5 % (ref 11.5–15.5)
WBC Count: 8.7 10*3/uL (ref 4.0–10.5)
nRBC: 0.7 % — ABNORMAL HIGH (ref 0.0–0.2)

## 2020-03-30 LAB — CMP (CANCER CENTER ONLY)
ALT: 16 U/L (ref 0–44)
AST: 13 U/L — ABNORMAL LOW (ref 15–41)
Albumin: 4 g/dL (ref 3.5–5.0)
Alkaline Phosphatase: 44 U/L (ref 38–126)
Anion gap: 10 (ref 5–15)
BUN: 34 mg/dL — ABNORMAL HIGH (ref 8–23)
CO2: 27 mmol/L (ref 22–32)
Calcium: 9.5 mg/dL (ref 8.9–10.3)
Chloride: 98 mmol/L (ref 98–111)
Creatinine: 1.45 mg/dL — ABNORMAL HIGH (ref 0.61–1.24)
GFR, Estimated: 51 mL/min — ABNORMAL LOW (ref >60)
Glucose, Bld: 112 mg/dL — ABNORMAL HIGH (ref 70–99)
Potassium: 3.6 mmol/L (ref 3.5–5.1)
Sodium: 135 mmol/L (ref 135–145)
Total Bilirubin: 0.3 mg/dL (ref 0.3–1.2)
Total Protein: 6 g/dL — ABNORMAL LOW (ref 6.5–8.1)

## 2020-03-30 LAB — LACTATE DEHYDROGENASE: LDH: 171 U/L (ref 98–192)

## 2020-03-30 MED ORDER — SODIUM CHLORIDE 0.9 % IV SOLN
3.2000 mg/m2 | Freq: Once | INTRAVENOUS | Status: AC
  Administered 2020-03-30: 5.8 mg via INTRAVENOUS
  Filled 2020-03-30: qty 11.6

## 2020-03-30 MED ORDER — HEPARIN SOD (PORK) LOCK FLUSH 100 UNIT/ML IV SOLN
500.0000 [IU] | Freq: Once | INTRAVENOUS | Status: AC | PRN
  Administered 2020-03-30: 500 [IU]
  Filled 2020-03-30: qty 5

## 2020-03-30 MED ORDER — SODIUM CHLORIDE 0.9% FLUSH
10.0000 mL | INTRAVENOUS | Status: DC | PRN
  Administered 2020-03-30: 10 mL
  Filled 2020-03-30: qty 10

## 2020-03-30 MED ORDER — SODIUM CHLORIDE 0.9 % IV SOLN
10.0000 mg | Freq: Once | INTRAVENOUS | Status: AC
  Administered 2020-03-30: 10 mg via INTRAVENOUS
  Filled 2020-03-30: qty 10

## 2020-03-30 MED ORDER — SODIUM CHLORIDE 0.9 % IV SOLN
Freq: Once | INTRAVENOUS | Status: AC
  Filled 2020-03-30: qty 250

## 2020-03-30 MED ORDER — PALONOSETRON HCL INJECTION 0.25 MG/5ML
INTRAVENOUS | Status: AC
  Filled 2020-03-30: qty 5

## 2020-03-30 MED ORDER — PALONOSETRON HCL INJECTION 0.25 MG/5ML
0.2500 mg | Freq: Once | INTRAVENOUS | Status: AC
  Administered 2020-03-30: 0.25 mg via INTRAVENOUS

## 2020-03-30 NOTE — Patient Instructions (Signed)

## 2020-03-30 NOTE — Progress Notes (Signed)
Hematology and Oncology Follow Up Visit  JAZION ATTEBERRY [id number] 07/13/1945 75 y.o. 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 Iron def anemia -- malabsorption  Past Therapy: Zytiga 1000 mg by mouth daily - discontinued on 08/15/2016 Xtandi 160 mg po q day - start on 08/15/2016 - discontinued Radium-223 therapy -- s/p cycle #6 Palliative radiation to the right sacrum Lutathera - s/p cycle 4/4 --last dose on 07/11/2017 Carbo/VP-16/Tecentriq -- S/p cycle #5 Keytruda q 6 week -- Maintanence -- s/p cycle #3 - changed from 3 to 6 week on 06/30/2018 Taxotere/Keytruda -- start on 09/17/2018 -- s/p cycle #10 -- d/c due to progression  Current Therapy: Lurbinectedin --started on 04/29/2019, s/p cycle 16 Xgeva 120 mg subcutaneous Q3 months - due in 04/2020 Lupron 22 mg IM every 3 months - due in 04/2020  IV iron as indicated    Interim History:  Mr. Werntz is here today for follow-up and treatment. He is doing fairly well. He notes that on the Decadron he does not always sleep well.  The steroid has helped relieve his pain. He states that he anticipates hearing from Dr. 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  PSA is pending.  Chromogranin A was down to 772 (previously 1,016) last month.  No fever, chills, n/v, cough, rash, dizziness, chest pain, palpitations, abdominal pain or changes in bowel or bladder habits.  He has occasional SOB with over exertion or with constipation.  No swelling, tenderness, numbness or tingling in his extremities at this time.  Pedal pulses are 2+.  No falls or syncope to report at this time.  He ambulates with his cane for added support.  He has a good appetite but admits that he needs to better hydrate throughout the day.    ECOG Performance Status: 2 - Symptomatic, <50% confined to bed  Medications:  Allergies as of 03/30/2020      Reactions   Codeine Nausea And Vomiting   Hydrocodone Nausea And Vomiting      Medication List       Accurate as of March 30, 2020 10:24 AM. If you have any questions, ask your nurse or doctor.        aspirin 81 MG tablet Take 81 mg by mouth daily.   CALCIUM-IRON-VIT D-VIT K PO Take 1 tablet by mouth 2 (two) times daily.   dexamethasone 4 MG tablet Commonly known as: DECADRON Take 5 tablets (20 mg total) by mouth daily. Make sure you take the Decadron with food!!!!   gabapentin 300 MG capsule Commonly known as: NEURONTIN TAKE 1 CAPSULE BY MOUTH TWICE A DAY THEN TAKE 2 CAPSULES AT BEDTIME   Generlac 10 GM/15ML Soln Generic drug: lactulose (encephalopathy) SMARTSIG:45 Milliliter(s) By Mouth Daily PRN   isosorbide mononitrate 30 MG 24 hr tablet Commonly known as: IMDUR TAKE 1 TABLET (30 MG TOTAL) BY MOUTH DAILY. What changed:   when to take this  reasons to take this Changed by: Jenne Campus, MD   lidocaine-prilocaine cream Commonly known as: EMLA   OVER THE COUNTER MEDICATION every morning. Juice Plus -- 8 capsule of each the garden, vineyard, and orchard twice a day.   Oyster Shell Calcium/D 250-125 MG-UNIT Tabs Take by mouth daily.   pantoprazole 40 MG tablet Commonly known as: PROTONIX TAKE 1 TABLET (  40 MG TOTAL) BY MOUTH AT BEDTIME.   prochlorperazine 10 MG tablet Commonly known as: COMPAZINE TAKE 1 TABLET (10 MG TOTAL) BY MOUTH EVERY 6 (SIX) HOURS AS NEEDED (NAUSEA OR VOMITING).   senna-docusate 8.6-50 MG tablet Commonly known as: Senokot-S Take 2-4 tablets by mouth at bedtime.   Zetia 10 MG tablet Generic drug: ezetimibe Take 10 mg by mouth daily.   Zinc 50 MG Caps Take 50 mg by mouth daily.       Allergies:  Allergies  Allergen Reactions  . Codeine Nausea And Vomiting  . Hydrocodone Nausea And Vomiting    Past  Medical History, Surgical history, Social history, and Family History were reviewed and updated.  Review of Systems: All other 10 point review of systems is negative.   Physical Exam:  vitals were not taken for this visit.   Wt Readings from Last 3 Encounters:  03/30/20 154 lb 1.3 oz (69.9 kg)  03/24/20 156 lb (70.8 kg)  03/09/20 156 lb (70.8 kg)    Ocular: Sclerae unicteric, pupils equal, round and reactive to light Ear-nose-throat: Oropharynx clear, dentition fair Lymphatic: No cervical or supraclavicular adenopathy Lungs no rales or rhonchi, good excursion bilaterally Heart regular rate and rhythm, no murmur appreciated Abd soft, nontender, positive bowel sounds MSK no focal spinal tenderness, no joint edema, he wears a brace on the right lower extremity Neuro: non-focal, well-oriented, appropriate affect Breasts: Deferred   Lab Results  Component Value Date   WBC 4.4 03/09/2020   HGB 9.4 (L) 03/09/2020   HCT 28.9 (L) 03/09/2020   MCV 101.4 (H) 03/09/2020   PLT 364 03/09/2020   Lab Results  Component Value Date   FERRITIN 478 (H) 03/09/2020   IRON 61 03/09/2020   TIBC 258 03/09/2020   UIBC 196 03/09/2020   IRONPCTSAT 24 03/09/2020   Lab Results  Component Value Date   RETICCTPCT 1.6 04/08/2019   RBC 2.85 (L) 03/09/2020   No results found for: KPAFRELGTCHN, LAMBDASER, KAPLAMBRATIO No results found for: IGGSERUM, IGA, IGMSERUM No results found for: Kathrynn Ducking, MSPIKE, SPEI   Chemistry      Component Value Date/Time   NA 136 03/09/2020 0930   NA 144 12/14/2016 1159   NA 140 10/22/2016 1129   K 3.8 03/09/2020 0930   K 3.5 12/14/2016 1159   K 4.3 10/22/2016 1129   CL 104 03/09/2020 0930   CL 103 12/14/2016 1159   CO2 23 03/09/2020 0930   CO2 30 12/14/2016 1159   CO2 27 10/22/2016 1129   BUN 20 03/09/2020 0930   BUN 16 12/14/2016 1159   BUN 14.0 10/22/2016 1129   CREATININE 1.50 (H) 03/09/2020 0930    CREATININE 1.3 (H) 12/14/2016 1159   CREATININE 1.1 10/22/2016 1129      Component Value Date/Time   CALCIUM 9.1 03/09/2020 0930   CALCIUM 10.0 12/14/2016 1159   CALCIUM 10.4 10/22/2016 1129   ALKPHOS 50 03/09/2020 0930   ALKPHOS 48 12/14/2016 1159   ALKPHOS 60 10/22/2016 1129   AST 20 03/09/2020 0930   AST 16 10/22/2016 1129   ALT 17 03/09/2020 0930   ALT 23 12/14/2016 1159   ALT 14 10/22/2016 1129   BILITOT 0.3 03/09/2020 0930   BILITOT 0.31 10/22/2016 1129       Impression and Plan: Mr.Wrightis 75 yo caucasian gentleman with metastatic neuroendocrine carcinoma of unknown primary. We will proceed with treatment today as planned.  He will await a call from Dr.  Kinard to discuss whether or not he will proceed with radiation to the sacrum.  Follow-up in 3 weeks.  He can contact our office with any questions or concerns. We can certainly see him sooner if needed.   Laverna Peace, NP 3/16/202210:24 AM

## 2020-03-30 NOTE — Patient Instructions (Signed)
Lurbinectedin Injection What is this medicine? LURBINECTEDIN (LOOR bin EK te din) is a chemotherapy drug. This medicine is used to treat lung cancer. This medicine may be used for other purposes; ask your health care provider or pharmacist if you have questions. COMMON BRAND NAME(S): ZEPZELCA What should I tell my health care provider before I take this medicine? They need to know if you have any of these conditions:  infection (especially a viral infection such as chickenpox, cold sores, or herpes)  liver disease  low blood counts, like white cells, platelets, or red blood cells  an unusual or allergic reaction to lurbinectedin, other medicines, foods, dyes or preservatives  pregnant or trying to get pregnant  breast-feeding How should I use this medicine? This medicine is for infusion into a vein. It is given by a healthcare professional in a hospital or clinic setting. Talk to your pediatrician about the use of this medicine in children. Special care may be needed. Overdosage: If you think you have taken too much of this medicine contact a poison control center or emergency room at once. NOTE: This medicine is only for you. Do not share this medicine with others. What if I miss a dose? Keep appointments for follow-up doses. It is important not to miss your dose. Call your doctor or health care professional if you are unable to keep an appointment. What may interact with this medicine?  certain antibiotics like erythromycin or clarithromycin  certain antivirals for HIV or hepatitis  certain medicines for fungal infections like ketoconazole, itraconazole, or posaconazole  certain medicines for seizures like carbamazepine, phenobarbital, phenytoin  grapefruit or grapefruit juice  St. John's Wort This list may not describe all possible interactions. Give your health care provider a list of all the medicines, herbs, non-prescription drugs, or dietary supplements you use. Also  tell them if you smoke, drink alcohol, or use illegal drugs. Some items may interact with your medicine. What should I watch for while using this medicine? Your condition will be monitored carefully while you are receiving this medicine. Do not become pregnant while taking this medicine or for 6 months after stopping it. Women should inform their health care professional if they wish to become pregnant or think they might be pregnant. Men should not father a child while taking this medicine and for 4 months after stopping it. There is potential for serious side effects to an unborn child. Talk to your health care professional for more information. Do not breast-feed a child while taking this medicine or for 2 weeks after stopping it. Call your health care professional for advice if you get a fever, chills, or sore throat, or other symptoms of a cold or flu. Do not treat yourself. This medicine decreases your body's ability to fight infections. Try to avoid being around people who are sick. Avoid taking medicines that contain aspirin, acetaminophen, ibuprofen, naproxen, or ketoprofen unless instructed by your health care professional. These medicines may hide a fever. Be careful brushing or flossing your teeth or using a toothpick because you may get an infection or bleed more easily. If you have any dental work done, tell your dentist you are receiving this medicine. What side effects may I notice from receiving this medicine? Side effects that you should report to your doctor or health care professional as soon as possible:  allergic reactions like skin rash, itching or hives; swelling of the face, lips, or tongue  chest pain  nausea, vomiting  signs and symptoms of  bleeding such as bloody or black, tarry stools; red or dark-brown urine; spitting up blood or brown material that looks like coffee grounds; red spots on the skin; unusual bruising or bleeding from the eyes, gums, or nose  signs and  symptoms of infection like fever; chills; cough; sore throat; pain or trouble passing urine  signs and symptoms of liver injury like dark yellow or brown urine; general ill feeling or flu-like symptoms; light-colored stools; loss of appetite; nausea; right upper belly pain; unusually weak or tired; yellowing of the eyes or skin Side effects that usually do not require medical attention (report these to your doctor or health care professional if they continue or are bothersome):  changes in taste  constipation  diarrhea  loss of appetite  muscle pain  pain, tingling, numbness in the hands or feet  signs and symptoms of high blood sugar such as being more thirsty or hungry or having to urinate more than normal. You may also feel very tired or have blurry vision.  signs and symptoms of low magnesium like muscle cramps; muscle pain; muscle weakness; tremors; seizures; or fast, irregular heartbeat  signs and symptoms of low red blood cells or anemia such as unusually weak or tired; feeling faint or lightheaded; falls; breathing problems  stomach pain This list may not describe all possible side effects. Call your doctor for medical advice about side effects. You may report side effects to FDA at 1-800-FDA-1088. Where should I keep my medicine? This medicine is given in a hospital or clinic and will not be stored at home. NOTE: This sheet is a summary. It may not cover all possible information. If you have questions about this medicine, talk to your doctor, pharmacist, or health care provider.  2021 Elsevier/Gold Standard (2018-07-03 13:00:33)

## 2020-03-31 LAB — PSA, TOTAL AND FREE
PSA, Free Pct: UNDETERMINED %
PSA, Free: <0.01 ng/mL
Prostate Specific Ag, Serum: <0.1 ng/mL (ref 0.0–4.0)

## 2020-03-31 LAB — TESTOSTERONE: Testosterone: <3 ng/dL — ABNORMAL LOW (ref 264–916)

## 2020-04-04 ENCOUNTER — Telehealth: Payer: Self-pay | Admitting: *Deleted

## 2020-04-04 NOTE — Telephone Encounter (Signed)
Returned patient's phone call, spoke with patient 

## 2020-04-05 ENCOUNTER — Ambulatory Visit
Admission: RE | Admit: 2020-04-05 | Discharge: 2020-04-05 | Disposition: A | Discharge status: Home / Self Care | Payer: Medicare Other | Source: Ambulatory Visit | Attending: Radiation Oncology | Admitting: Radiation Oncology

## 2020-04-05 DIAGNOSIS — C7B8 Other secondary neuroendocrine tumors: Secondary | ICD-10-CM | POA: Insufficient documentation

## 2020-04-05 DIAGNOSIS — G893 Neoplasm related pain (acute) (chronic): Secondary | ICD-10-CM | POA: Diagnosis not present

## 2020-04-05 DIAGNOSIS — C7951 Secondary malignant neoplasm of bone: Secondary | ICD-10-CM | POA: Insufficient documentation

## 2020-04-05 DIAGNOSIS — C7A8 Other malignant neuroendocrine tumors: Secondary | ICD-10-CM | POA: Diagnosis not present

## 2020-04-05 DIAGNOSIS — C61 Malignant neoplasm of prostate: Secondary | ICD-10-CM | POA: Diagnosis not present

## 2020-04-11 ENCOUNTER — Telehealth: Payer: Self-pay | Admitting: Radiation Oncology

## 2020-04-11 DIAGNOSIS — G893 Neoplasm related pain (acute) (chronic): Secondary | ICD-10-CM | POA: Diagnosis not present

## 2020-04-11 DIAGNOSIS — C7B8 Other secondary neuroendocrine tumors: Secondary | ICD-10-CM | POA: Diagnosis not present

## 2020-04-11 DIAGNOSIS — C61 Malignant neoplasm of prostate: Secondary | ICD-10-CM | POA: Diagnosis not present

## 2020-04-11 DIAGNOSIS — C7951 Secondary malignant neoplasm of bone: Secondary | ICD-10-CM | POA: Diagnosis not present

## 2020-04-11 DIAGNOSIS — C7A8 Other malignant neuroendocrine tumors: Secondary | ICD-10-CM | POA: Diagnosis not present

## 2020-04-11 NOTE — Telephone Encounter (Signed)
Continuation of previous note: We discussed potential complications of high-dose to the sacrum and sacral nerve area in detail.  Given the patient's previous problems and wanted to avoid progression of his current problem he wishes to proceed with radiation therapy as planned understanding that there is high risk for complications given a cumulative dose to these areas.  -----------------------------------  Blair Promise, PhD, MD

## 2020-04-13 ENCOUNTER — Other Ambulatory Visit: Payer: Self-pay

## 2020-04-13 ENCOUNTER — Ambulatory Visit
Admission: RE | Admit: 2020-04-13 | Discharge: 2020-04-13 | Disposition: A | Discharge status: Home / Self Care | Payer: Medicare Other | Source: Ambulatory Visit | Attending: Radiation Oncology | Admitting: Radiation Oncology

## 2020-04-13 DIAGNOSIS — C7951 Secondary malignant neoplasm of bone: Secondary | ICD-10-CM | POA: Diagnosis not present

## 2020-04-13 DIAGNOSIS — C61 Malignant neoplasm of prostate: Secondary | ICD-10-CM | POA: Diagnosis not present

## 2020-04-13 DIAGNOSIS — C7B8 Other secondary neuroendocrine tumors: Secondary | ICD-10-CM | POA: Diagnosis not present

## 2020-04-13 DIAGNOSIS — G893 Neoplasm related pain (acute) (chronic): Secondary | ICD-10-CM | POA: Diagnosis not present

## 2020-04-13 DIAGNOSIS — C7A8 Other malignant neuroendocrine tumors: Secondary | ICD-10-CM | POA: Diagnosis not present

## 2020-04-14 ENCOUNTER — Ambulatory Visit
Admission: RE | Admit: 2020-04-14 | Discharge: 2020-04-14 | Disposition: A | Discharge status: Home / Self Care | Payer: Medicare Other | Source: Ambulatory Visit | Attending: Radiation Oncology | Admitting: Radiation Oncology

## 2020-04-14 DIAGNOSIS — C7A8 Other malignant neuroendocrine tumors: Secondary | ICD-10-CM | POA: Diagnosis not present

## 2020-04-14 DIAGNOSIS — G893 Neoplasm related pain (acute) (chronic): Secondary | ICD-10-CM | POA: Diagnosis not present

## 2020-04-14 DIAGNOSIS — C7B8 Other secondary neuroendocrine tumors: Secondary | ICD-10-CM | POA: Diagnosis not present

## 2020-04-14 DIAGNOSIS — C7951 Secondary malignant neoplasm of bone: Secondary | ICD-10-CM | POA: Diagnosis not present

## 2020-04-14 DIAGNOSIS — C61 Malignant neoplasm of prostate: Secondary | ICD-10-CM | POA: Diagnosis not present

## 2020-04-15 ENCOUNTER — Ambulatory Visit
Admission: RE | Admit: 2020-04-15 | Discharge: 2020-04-15 | Disposition: A | Discharge status: Home / Self Care | Payer: Medicare Other | Source: Ambulatory Visit | Attending: Radiation Oncology | Admitting: Radiation Oncology

## 2020-04-15 ENCOUNTER — Other Ambulatory Visit: Payer: Self-pay

## 2020-04-15 DIAGNOSIS — C7A8 Other malignant neuroendocrine tumors: Secondary | ICD-10-CM | POA: Diagnosis not present

## 2020-04-15 DIAGNOSIS — C7951 Secondary malignant neoplasm of bone: Secondary | ICD-10-CM | POA: Insufficient documentation

## 2020-04-15 DIAGNOSIS — C61 Malignant neoplasm of prostate: Secondary | ICD-10-CM | POA: Diagnosis not present

## 2020-04-15 DIAGNOSIS — C7B8 Other secondary neuroendocrine tumors: Secondary | ICD-10-CM | POA: Diagnosis not present

## 2020-04-15 DIAGNOSIS — G893 Neoplasm related pain (acute) (chronic): Secondary | ICD-10-CM | POA: Diagnosis not present

## 2020-04-17 ENCOUNTER — Other Ambulatory Visit (HOSPITAL_BASED_OUTPATIENT_CLINIC_OR_DEPARTMENT_OTHER): Payer: Self-pay

## 2020-04-18 ENCOUNTER — Other Ambulatory Visit: Payer: Self-pay

## 2020-04-18 ENCOUNTER — Ambulatory Visit
Admission: RE | Admit: 2020-04-18 | Discharge: 2020-04-18 | Disposition: A | Discharge status: Home / Self Care | Payer: Medicare Other | Source: Ambulatory Visit | Attending: Radiation Oncology | Admitting: Radiation Oncology

## 2020-04-18 DIAGNOSIS — C7951 Secondary malignant neoplasm of bone: Secondary | ICD-10-CM | POA: Diagnosis not present

## 2020-04-18 DIAGNOSIS — G893 Neoplasm related pain (acute) (chronic): Secondary | ICD-10-CM | POA: Diagnosis not present

## 2020-04-18 DIAGNOSIS — C61 Malignant neoplasm of prostate: Secondary | ICD-10-CM | POA: Diagnosis not present

## 2020-04-18 DIAGNOSIS — C7A8 Other malignant neuroendocrine tumors: Secondary | ICD-10-CM | POA: Diagnosis not present

## 2020-04-18 DIAGNOSIS — C7B8 Other secondary neuroendocrine tumors: Secondary | ICD-10-CM | POA: Diagnosis not present

## 2020-04-19 ENCOUNTER — Ambulatory Visit
Admission: RE | Admit: 2020-04-19 | Discharge: 2020-04-19 | Disposition: A | Discharge status: Home / Self Care | Payer: Medicare Other | Source: Ambulatory Visit | Attending: Radiation Oncology | Admitting: Radiation Oncology

## 2020-04-19 DIAGNOSIS — C7B8 Other secondary neuroendocrine tumors: Secondary | ICD-10-CM | POA: Diagnosis not present

## 2020-04-19 DIAGNOSIS — C7951 Secondary malignant neoplasm of bone: Secondary | ICD-10-CM | POA: Diagnosis not present

## 2020-04-19 DIAGNOSIS — C7A8 Other malignant neuroendocrine tumors: Secondary | ICD-10-CM | POA: Diagnosis not present

## 2020-04-19 DIAGNOSIS — G893 Neoplasm related pain (acute) (chronic): Secondary | ICD-10-CM | POA: Diagnosis not present

## 2020-04-19 DIAGNOSIS — C61 Malignant neoplasm of prostate: Secondary | ICD-10-CM | POA: Diagnosis not present

## 2020-04-20 ENCOUNTER — Inpatient Hospital Stay: Payer: Medicare Other | Attending: Hematology & Oncology

## 2020-04-20 ENCOUNTER — Encounter: Payer: Self-pay | Admitting: Hematology & Oncology

## 2020-04-20 ENCOUNTER — Other Ambulatory Visit: Payer: Self-pay | Admitting: Hematology & Oncology

## 2020-04-20 ENCOUNTER — Inpatient Hospital Stay: Payer: Medicare Other

## 2020-04-20 ENCOUNTER — Inpatient Hospital Stay (HOSPITAL_BASED_OUTPATIENT_CLINIC_OR_DEPARTMENT_OTHER): Payer: Medicare Other | Admitting: Hematology & Oncology

## 2020-04-20 ENCOUNTER — Other Ambulatory Visit (HOSPITAL_BASED_OUTPATIENT_CLINIC_OR_DEPARTMENT_OTHER): Payer: Self-pay

## 2020-04-20 ENCOUNTER — Ambulatory Visit: Admission: RE | Admit: 2020-04-20 | Payer: Medicare Other | Source: Ambulatory Visit

## 2020-04-20 ENCOUNTER — Telehealth: Payer: Self-pay

## 2020-04-20 ENCOUNTER — Other Ambulatory Visit: Payer: Self-pay

## 2020-04-20 VITALS — BP 124/66 | HR 99 | Temp 98.5°F | Resp 19 | Wt 157.8 lb

## 2020-04-20 DIAGNOSIS — D508 Other iron deficiency anemias: Secondary | ICD-10-CM | POA: Insufficient documentation

## 2020-04-20 DIAGNOSIS — C7951 Secondary malignant neoplasm of bone: Secondary | ICD-10-CM

## 2020-04-20 DIAGNOSIS — M2550 Pain in unspecified joint: Secondary | ICD-10-CM | POA: Insufficient documentation

## 2020-04-20 DIAGNOSIS — C7A8 Other malignant neuroendocrine tumors: Secondary | ICD-10-CM | POA: Diagnosis not present

## 2020-04-20 DIAGNOSIS — Z5111 Encounter for antineoplastic chemotherapy: Secondary | ICD-10-CM | POA: Diagnosis not present

## 2020-04-20 DIAGNOSIS — C61 Malignant neoplasm of prostate: Secondary | ICD-10-CM | POA: Insufficient documentation

## 2020-04-20 DIAGNOSIS — Z79899 Other long term (current) drug therapy: Secondary | ICD-10-CM | POA: Insufficient documentation

## 2020-04-20 DIAGNOSIS — D500 Iron deficiency anemia secondary to blood loss (chronic): Secondary | ICD-10-CM

## 2020-04-20 DIAGNOSIS — Z923 Personal history of irradiation: Secondary | ICD-10-CM | POA: Insufficient documentation

## 2020-04-20 DIAGNOSIS — K909 Intestinal malabsorption, unspecified: Secondary | ICD-10-CM | POA: Insufficient documentation

## 2020-04-20 DIAGNOSIS — R531 Weakness: Secondary | ICD-10-CM | POA: Diagnosis not present

## 2020-04-20 DIAGNOSIS — C7B8 Other secondary neuroendocrine tumors: Secondary | ICD-10-CM

## 2020-04-20 DIAGNOSIS — Z885 Allergy status to narcotic agent status: Secondary | ICD-10-CM | POA: Insufficient documentation

## 2020-04-20 DIAGNOSIS — G63 Polyneuropathy in diseases classified elsewhere: Secondary | ICD-10-CM

## 2020-04-20 DIAGNOSIS — C801 Malignant (primary) neoplasm, unspecified: Secondary | ICD-10-CM

## 2020-04-20 LAB — CBC WITH DIFFERENTIAL (CANCER CENTER ONLY)
Abs Immature Granulocytes: 0.4 10*3/uL — ABNORMAL HIGH (ref 0.00–0.07)
Basophils Absolute: 0 10*3/uL (ref 0.0–0.1)
Basophils Relative: 0 %
Eosinophils Absolute: 0 10*3/uL (ref 0.0–0.5)
Eosinophils Relative: 0 %
HCT: 32.2 % — ABNORMAL LOW (ref 39.0–52.0)
Hemoglobin: 10.6 g/dL — ABNORMAL LOW (ref 13.0–17.0)
Immature Granulocytes: 5 %
Lymphocytes Relative: 9 %
Lymphs Abs: 0.8 10*3/uL (ref 0.7–4.0)
MCH: 33.8 pg (ref 26.0–34.0)
MCHC: 32.9 g/dL (ref 30.0–36.0)
MCV: 102.5 fL — ABNORMAL HIGH (ref 80.0–100.0)
Monocytes Absolute: 0.8 10*3/uL (ref 0.1–1.0)
Monocytes Relative: 9 %
Neutro Abs: 6.6 10*3/uL (ref 1.7–7.7)
Neutrophils Relative %: 77 %
Platelet Count: 254 10*3/uL (ref 150–400)
RBC: 3.14 MIL/uL — ABNORMAL LOW (ref 4.22–5.81)
RDW: 16.9 % — ABNORMAL HIGH (ref 11.5–15.5)
WBC Count: 8.6 10*3/uL (ref 4.0–10.5)
nRBC: 0.3 % — ABNORMAL HIGH (ref 0.0–0.2)

## 2020-04-20 LAB — CMP (CANCER CENTER ONLY)
ALT: 20 U/L (ref 0–44)
AST: 13 U/L — ABNORMAL LOW (ref 15–41)
Albumin: 3.8 g/dL (ref 3.5–5.0)
Alkaline Phosphatase: 46 U/L (ref 38–126)
Anion gap: 9 (ref 5–15)
BUN: 34 mg/dL — ABNORMAL HIGH (ref 8–23)
CO2: 26 mmol/L (ref 22–32)
Calcium: 9.4 mg/dL (ref 8.9–10.3)
Chloride: 101 mmol/L (ref 98–111)
Creatinine: 1.45 mg/dL — ABNORMAL HIGH (ref 0.61–1.24)
GFR, Estimated: 51 mL/min — ABNORMAL LOW (ref >60)
Glucose, Bld: 142 mg/dL — ABNORMAL HIGH (ref 70–99)
Potassium: 3.8 mmol/L (ref 3.5–5.1)
Sodium: 136 mmol/L (ref 135–145)
Total Bilirubin: 0.3 mg/dL (ref 0.3–1.2)
Total Protein: 6.1 g/dL — ABNORMAL LOW (ref 6.5–8.1)

## 2020-04-20 LAB — IRON AND TIBC
Iron: 116 ug/dL (ref 42–163)
Saturation Ratios: 41 % (ref 20–55)
TIBC: 285 ug/dL (ref 202–409)
UIBC: 169 ug/dL (ref 117–376)

## 2020-04-20 LAB — LACTATE DEHYDROGENASE: LDH: 186 U/L (ref 98–192)

## 2020-04-20 LAB — FERRITIN: Ferritin: 353 ng/mL — ABNORMAL HIGH (ref 24–336)

## 2020-04-20 MED ORDER — LEUPROLIDE ACETATE (3 MONTH) 22.5 MG ~~LOC~~ KIT
22.5000 mg | PACK | Freq: Once | SUBCUTANEOUS | Status: AC
  Administered 2020-04-20: 22.5 mg via SUBCUTANEOUS
  Filled 2020-04-20: qty 22.5

## 2020-04-20 MED ORDER — PALONOSETRON HCL INJECTION 0.25 MG/5ML
0.2500 mg | Freq: Once | INTRAVENOUS | Status: AC
  Administered 2020-04-20: 0.25 mg via INTRAVENOUS

## 2020-04-20 MED ORDER — GABAPENTIN 300 MG PO CAPS
ORAL_CAPSULE | ORAL | 2 refills | Status: DC
  Filled 2020-04-20: qty 120, 30d supply, fill #0
  Filled 2020-06-03: qty 120, 30d supply, fill #1
  Filled 2020-07-11: qty 120, 30d supply, fill #2

## 2020-04-20 MED ORDER — HEPARIN SOD (PORK) LOCK FLUSH 100 UNIT/ML IV SOLN
500.0000 [IU] | Freq: Once | INTRAVENOUS | Status: AC | PRN
  Administered 2020-04-20: 500 [IU]
  Filled 2020-04-20: qty 5

## 2020-04-20 MED ORDER — SODIUM CHLORIDE 0.9 % IV SOLN
3.2000 mg/m2 | Freq: Once | INTRAVENOUS | Status: AC
  Administered 2020-04-20: 5.8 mg via INTRAVENOUS
  Filled 2020-04-20: qty 11.6

## 2020-04-20 MED ORDER — DENOSUMAB 120 MG/1.7ML ~~LOC~~ SOLN
SUBCUTANEOUS | Status: AC
  Filled 2020-04-20: qty 1.7

## 2020-04-20 MED ORDER — SODIUM CHLORIDE 0.9% FLUSH
10.0000 mL | INTRAVENOUS | Status: DC | PRN
  Administered 2020-04-20: 10 mL
  Filled 2020-04-20: qty 10

## 2020-04-20 MED ORDER — DENOSUMAB 120 MG/1.7ML ~~LOC~~ SOLN
120.0000 mg | Freq: Once | SUBCUTANEOUS | Status: AC
  Administered 2020-04-20: 120 mg via SUBCUTANEOUS

## 2020-04-20 MED ORDER — PALONOSETRON HCL INJECTION 0.25 MG/5ML
INTRAVENOUS | Status: AC
  Filled 2020-04-20: qty 5

## 2020-04-20 MED ORDER — SODIUM CHLORIDE 0.9 % IV SOLN
10.0000 mg | Freq: Once | INTRAVENOUS | Status: AC
  Administered 2020-04-20: 10 mg via INTRAVENOUS
  Filled 2020-04-20: qty 10

## 2020-04-20 MED ORDER — SODIUM CHLORIDE 0.9 % IV SOLN
Freq: Once | INTRAVENOUS | Status: AC
Start: 2020-04-20 — End: 2020-04-20
  Filled 2020-04-20: qty 250

## 2020-04-20 MED FILL — Dexamethasone Tab 4 MG: ORAL | 20 days supply | Qty: 100 | Fill #0 | Status: AC

## 2020-04-20 NOTE — Patient Instructions (Signed)

## 2020-04-20 NOTE — Patient Instructions (Signed)
Lurbinectedin Injection What is this medicine? LURBINECTEDIN (LOOR bin EK te din) is a chemotherapy drug. This medicine is used to treat lung cancer. This medicine may be used for other purposes; ask your health care provider or pharmacist if you have questions. COMMON BRAND NAME(S): ZEPZELCA What should I tell my health care provider before I take this medicine? They need to know if you have any of these conditions:  infection (especially a viral infection such as chickenpox, cold sores, or herpes)  liver disease  low blood counts, like white cells, platelets, or red blood cells  an unusual or allergic reaction to lurbinectedin, other medicines, foods, dyes or preservatives  pregnant or trying to get pregnant  breast-feeding How should I use this medicine? This medicine is for infusion into a vein. It is given by a healthcare professional in a hospital or clinic setting. Talk to your pediatrician about the use of this medicine in children. Special care may be needed. Overdosage: If you think you have taken too much of this medicine contact a poison control center or emergency room at once. NOTE: This medicine is only for you. Do not share this medicine with others. What if I miss a dose? Keep appointments for follow-up doses. It is important not to miss your dose. Call your doctor or health care professional if you are unable to keep an appointment. What may interact with this medicine?  certain antibiotics like erythromycin or clarithromycin  certain antivirals for HIV or hepatitis  certain medicines for fungal infections like ketoconazole, itraconazole, or posaconazole  certain medicines for seizures like carbamazepine, phenobarbital, phenytoin  grapefruit or grapefruit juice  St. John's Wort This list may not describe all possible interactions. Give your health care provider a list of all the medicines, herbs, non-prescription drugs, or dietary supplements you use. Also  tell them if you smoke, drink alcohol, or use illegal drugs. Some items may interact with your medicine. What should I watch for while using this medicine? Your condition will be monitored carefully while you are receiving this medicine. Do not become pregnant while taking this medicine or for 6 months after stopping it. Women should inform their health care professional if they wish to become pregnant or think they might be pregnant. Men should not father a child while taking this medicine and for 4 months after stopping it. There is potential for serious side effects to an unborn child. Talk to your health care professional for more information. Do not breast-feed a child while taking this medicine or for 2 weeks after stopping it. Call your health care professional for advice if you get a fever, chills, or sore throat, or other symptoms of a cold or flu. Do not treat yourself. This medicine decreases your body's ability to fight infections. Try to avoid being around people who are sick. Avoid taking medicines that contain aspirin, acetaminophen, ibuprofen, naproxen, or ketoprofen unless instructed by your health care professional. These medicines may hide a fever. Be careful brushing or flossing your teeth or using a toothpick because you may get an infection or bleed more easily. If you have any dental work done, tell your dentist you are receiving this medicine. What side effects may I notice from receiving this medicine? Side effects that you should report to your doctor or health care professional as soon as possible:  allergic reactions like skin rash, itching or hives; swelling of the face, lips, or tongue  chest pain  nausea, vomiting  signs and symptoms of  bleeding such as bloody or black, tarry stools; red or dark-brown urine; spitting up blood or brown material that looks like coffee grounds; red spots on the skin; unusual bruising or bleeding from the eyes, gums, or nose  signs and  symptoms of infection like fever; chills; cough; sore throat; pain or trouble passing urine  signs and symptoms of liver injury like dark yellow or brown urine; general ill feeling or flu-like symptoms; light-colored stools; loss of appetite; nausea; right upper belly pain; unusually weak or tired; yellowing of the eyes or skin Side effects that usually do not require medical attention (report these to your doctor or health care professional if they continue or are bothersome):  changes in taste  constipation  diarrhea  loss of appetite  muscle pain  pain, tingling, numbness in the hands or feet  signs and symptoms of high blood sugar such as being more thirsty or hungry or having to urinate more than normal. You may also feel very tired or have blurry vision.  signs and symptoms of low magnesium like muscle cramps; muscle pain; muscle weakness; tremors; seizures; or fast, irregular heartbeat  signs and symptoms of low red blood cells or anemia such as unusually weak or tired; feeling faint or lightheaded; falls; breathing problems  stomach pain This list may not describe all possible side effects. Call your doctor for medical advice about side effects. You may report side effects to FDA at 1-800-FDA-1088. Where should I keep my medicine? This medicine is given in a hospital or clinic and will not be stored at home. NOTE: This sheet is a summary. It may not cover all possible information. If you have questions about this medicine, talk to your doctor, pharmacist, or health care provider.  2021 [employer]vier/Gold Standard (2018-07-03 13:00:33)  Denosumab injection What is this medicine? DENOSUMAB (den oh sue mab) slows bone breakdown. Prolia is used to treat osteoporosis in women after menopause and in men, and in people who are taking corticosteroids for 6 months or more. Delton See is used to treat a high calcium level due to cancer and to prevent bone fractures and other bone problems caused  by multiple myeloma or cancer bone metastases. Delton See is also used to treat giant cell tumor of the bone. This medicine may be used for other purposes; ask your health care provider or pharmacist if you have questions. COMMON BRAND NAME(S): Prolia, XGEVA What should I tell my health care provider before I take this medicine? They need to know if you have any of these conditions:  dental disease  having surgery or tooth extraction  infection  kidney disease  low levels of calcium or Vitamin D in the blood  malnutrition  on hemodialysis  skin conditions or sensitivity  thyroid or parathyroid disease  an unusual reaction to denosumab, other medicines, foods, dyes, or preservatives  pregnant or trying to get pregnant  breast-feeding How should I use this medicine? This medicine is for injection under the skin. It is given by a health care professional in a hospital or clinic setting. A special MedGuide will be given to you before each treatment. Be sure to read this information carefully each time. For Prolia, talk to your pediatrician regarding the use of this medicine in children. Special care may be needed. For Delton See, talk to your pediatrician regarding the use of this medicine in children. While this drug may be prescribed for children as young as 13 years for selected conditions, precautions do apply. Overdosage: If you  think you have taken too much of this medicine contact a poison control center or emergency room at once. NOTE: This medicine is only for you. Do not share this medicine with others. What if I miss a dose? It is important not to miss your dose. Call your doctor or health care professional if you are unable to keep an appointment. What may interact with this medicine? Do not take this medicine with any of the following medications:  other medicines containing denosumab This medicine may also interact with the following medications:  medicines that lower your  chance of fighting infection  steroid medicines like prednisone or cortisone This list may not describe all possible interactions. Give your health care provider a list of all the medicines, herbs, non-prescription drugs, or dietary supplements you use. Also tell them if you smoke, drink alcohol, or use illegal drugs. Some items may interact with your medicine. What should I watch for while using this medicine? Visit your doctor or health care professional for regular checks on your progress. Your doctor or health care professional may order blood tests and other tests to see how you are doing. Call your doctor or health care professional for advice if you get a fever, chills or sore throat, or other symptoms of a cold or flu. Do not treat yourself. This drug may decrease your body's ability to fight infection. Try to avoid being around people who are sick. You should make sure you get enough calcium and vitamin D while you are taking this medicine, unless your doctor tells you not to. Discuss the foods you eat and the vitamins you take with your health care professional. See your dentist regularly. Brush and floss your teeth as directed. Before you have any dental work done, tell your dentist you are receiving this medicine. Do not become pregnant while taking this medicine or for 5 months after stopping it. Talk with your doctor or health care professional about your birth control options while taking this medicine. Women should inform their doctor if they wish to become pregnant or think they might be pregnant. There is a potential for serious side effects to an unborn child. Talk to your health care professional or pharmacist for more information. What side effects may I notice from receiving this medicine? Side effects that you should report to your doctor or health care professional as soon as possible:  allergic reactions like skin rash, itching or hives, swelling of the face, lips, or  tongue  bone pain  breathing problems  dizziness  jaw pain, especially after dental work  redness, blistering, peeling of the skin  signs and symptoms of infection like fever or chills; cough; sore throat; pain or trouble passing urine  signs of low calcium like fast heartbeat, muscle cramps or muscle pain; pain, tingling, numbness in the hands or feet; seizures  unusual bleeding or bruising  unusually weak or tired Side effects that usually do not require medical attention (report to your doctor or health care professional if they continue or are bothersome):  constipation  diarrhea  headache  joint pain  loss of appetite  muscle pain  runny nose  tiredness  upset stomach This list may not describe all possible side effects. Call your doctor for medical advice about side effects. You may report side effects to FDA at 1-800-FDA-1088. Where should I keep my medicine? This medicine is only given in a clinic, doctor's office, or other health care setting and will not be stored at  home. NOTE: This sheet is a summary. It may not cover all possible information. If you have questions about this medicine, talk to your doctor, pharmacist, or health care provider.  2021 [employer]/Gold Standard (2017-05-10 16:10:44)  Leuprolide depot injection What is this medicine? LEUPROLIDE (loo PROE lide) is a man-made protein that acts like a natural hormone in the body. It decreases testosterone in men and decreases estrogen in women. In men, this medicine is used to treat advanced prostate cancer. In women, some forms of this medicine may be used to treat endometriosis, uterine fibroids, or other male hormone-related problems. This medicine may be used for other purposes; ask your health care provider or pharmacist if you have questions. COMMON BRAND NAME(S): Eligard, Fensolv, Lupron Depot, Lupron Depot-Ped, Viadur What should I tell my health care provider before I take this  medicine? They need to know if you have any of these conditions:  diabetes  heart disease or previous heart attack  high blood pressure  high cholesterol  mental illness  osteoporosis  pain or difficulty passing urine  seizures  spinal cord metastasis  stroke  suicidal thoughts, plans, or attempt; a previous suicide attempt by you or a family member  tobacco smoker  unusual vaginal bleeding (women)  an unusual or allergic reaction to leuprolide, benzyl alcohol, other medicines, foods, dyes, or preservatives  pregnant or trying to get pregnant  breast-feeding How should I use this medicine? This medicine is for injection into a muscle or for injection under the skin. It is given by a health care professional in a hospital or clinic setting. The specific product will determine how it will be given to you. Make sure you understand which product you receive and how often you will receive it. Talk to your pediatrician regarding the use of this medicine in children. Special care may be needed. Overdosage: If you think you have taken too much of this medicine contact a poison control center or emergency room at once. NOTE: This medicine is only for you. Do not share this medicine with others. What if I miss a dose? It is important not to miss a dose. Call your doctor or health care professional if you are unable to keep an appointment. Depot injections: Depot injections are given either once-monthly, every 12 weeks, every 16 weeks, or every 24 weeks depending on the product you are prescribed. The product you are prescribed will be based on if you are male or male, and your condition. Make sure you understand your product and dosing. What may interact with this medicine? Do not take this medicine with any of the following medications:  chasteberry  cisapride  dronedarone  pimozide  thioridazine This medicine may also interact with the following medications:  herbal  or dietary supplements, like black cohosh or DHEA  male hormones, like estrogens or progestins and birth control pills, patches, rings, or injections  male hormones, like testosterone  other medicines that prolong the QT interval (abnormal heart rhythm) This list may not describe all possible interactions. Give your health care provider a list of all the medicines, herbs, non-prescription drugs, or dietary supplements you use. Also tell them if you smoke, drink alcohol, or use illegal drugs. Some items may interact with your medicine. What should I watch for while using this medicine? Visit your doctor or health care professional for regular checks on your progress. During the first weeks of treatment, your symptoms may get worse, but then will improve as you continue your treatment.  You may get hot flashes, increased bone pain, increased difficulty passing urine, or an aggravation of nerve symptoms. Discuss these effects with your doctor or health care professional, some of them may improve with continued use of this medicine. Male patients may experience a menstrual cycle or spotting during the first months of therapy with this medicine. If this continues, contact your doctor or health care professional. This medicine may increase blood sugar. Ask your healthcare provider if changes in diet or medicines are needed if you have diabetes. What side effects may I notice from receiving this medicine? Side effects that you should report to your doctor or health care professional as soon as possible:  allergic reactions like skin rash, itching or hives, swelling of the face, lips, or tongue  breathing problems  chest pain  depression or memory disorders  pain in your legs or groin  pain at site where injected or implanted  seizures  severe headache  signs and symptoms of high blood sugar such as being more thirsty or hungry or having to urinate more than normal. You may also feel very  tired or have blurry vision  swelling of the feet and legs  suicidal thoughts or other mood changes  visual changes  vomiting Side effects that usually do not require medical attention (report to your doctor or health care professional if they continue or are bothersome):  breast swelling or tenderness  decrease in sex drive or performance  diarrhea  hot flashes  loss of appetite  muscle, joint, or bone pains  nausea  redness or irritation at site where injected or implanted  skin problems or acne This list may not describe all possible side effects. Call your doctor for medical advice about side effects. You may report side effects to FDA at 1-800-FDA-1088. Where should I keep my medicine? This drug is given in a hospital or clinic and will not be stored at home. NOTE: This sheet is a summary. It may not cover all possible information. If you have questions about this medicine, talk to your doctor, pharmacist, or health care provider.  2021 [employer]/Gold Standard (2018-12-03 10:35:13)

## 2020-04-20 NOTE — Telephone Encounter (Signed)
appts made per 04/20/20 los and pt to gain sch at [facility] and through [employer]

## 2020-04-20 NOTE — Progress Notes (Signed)
Hematology and Oncology Follow Up Visit  [name] [id number] 07-03-1945 75 y.o. 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 Iron def anemia -- malabsorption  Past Therapy: Zytiga 1000 mg by mouth daily - discontinued on 08/15/2016 Xtandi 160 mg po q day - start on 08/15/2016 - discontinued Radium-223 therapy -- s/p cycle #6 Palliative radiation to the right sacrum Lutathera - s/p cycle 4/4 --last dose on 07/11/2017 Carbo/VP-16/Tecentriq -- S/p cycle #5 Keytruda q 6 week -- Maintanence -- s/p cycle #3 - changed from 3 to 6 week on 06/30/2018 Taxotere/Keytruda -- start on 09/17/2018 -- s/p cycle #10 -- d/c due to progression  Current Therapy: Lurbinectedin --started on 04/29/2019, s/p cycle 16 Xgeva 120 mg subcutaneous Q3 months - due in 07/2020 Lupron 22 mg IM every 3 months - due in 74/2022  SBRT to sacral lesion  IV iron as indicated    Interim History:  [name] is here today for follow-up and treatment.  He started radiation therapy for the sacrum.  He was seen by Dr. Sondra Come of Radiation Oncology.  He has his final treatment next week.  He says he feels better.  There is no problems with pain.  There is no increased weakness.  He has had no problems with bowels or bladder.  He and his wife are planning on going down to [place] on Easter.  He has a brother who lives in [place].  They will be down there for almost a week.  I have no problems with him going down there.  I told to make sure that he drinks a lot of water and wears sunscreen.  He has had no problems with nausea or vomiting.  He has had no change in bowel or bladder habits.  He has had no fever.  He has had no cough or shortness of breath.  He does have a brace on the right lower leg.  This seems to be holding steady.  I think we can cut back on his steroids now.  He is on 12 mg a day.  I told to  go on to 8 mg a day for the next 5 days and then go down to 4 mg a day.  Currently, I would say his performance status is ECOG 1.  Medications:  Allergies as of 04/20/2020      Reactions   Codeine Nausea And Vomiting   Hydrocodone Nausea And Vomiting      Medication List       Accurate as of April 20, 2020 12:14 PM. If you have any questions, ask your nurse or doctor.        amoxicillin 500 MG capsule Commonly known as: AMOXIL TAKE 4 CAPSULE BY MOUTH ONCE FOR 1 DOSE 1 HOUR PRIOR TO DENTAL PROCEDURE   aspirin 81 MG tablet Take 81 mg by mouth daily.   CALCIUM-IRON-VIT D-VIT K PO Take 1 tablet by mouth 2 (two) times daily.   dexamethasone 4 MG tablet Commonly known as: DECADRON TAKE 5 TABLETS BY MOUTH DAILY **TAKE WITH FOOD** What changed:   how much to take  when to take this   gabapentin 300 MG capsule Commonly known as: NEURONTIN TAKE 1 CAPSULE BY MOUTH TWICE A DAY THEN TAKE 2 CAPSULES AT BEDTIME   Generlac 10 GM/15ML Soln Generic drug: lactulose (encephalopathy) daily as needed.   isosorbide mononitrate 30 MG 24 hr tablet Commonly known as: IMDUR TAKE  1 TABLET (30 MG TOTAL) BY MOUTH DAILY.   lidocaine-prilocaine cream Commonly known as: EMLA   OVER THE COUNTER MEDICATION every morning. Juice Plus -- 8 capsule of each the garden, vineyard, and orchard twice a day.   [place] Calcium/D 250-125 MG-UNIT Tabs Take by mouth daily.   pantoprazole 40 MG tablet Commonly known as: PROTONIX TAKE 1 TABLET (40 MG TOTAL) BY MOUTH AT BEDTIME.   prochlorperazine 10 MG tablet Commonly known as: COMPAZINE TAKE 1 TABLET (10 MG TOTAL) BY MOUTH EVERY 6 (SIX) HOURS AS NEEDED (NAUSEA OR VOMITING).   senna-docusate 8.6-50 MG tablet Commonly known as: Senokot-S Take 2-4 tablets by mouth at bedtime.   Zetia 10 MG tablet Generic drug: ezetimibe Take 10 mg by mouth daily.   ezetimibe 10 MG tablet Commonly known as: ZETIA TAKE 1 TABLET BY MOUTH DAILY   Zinc 50 MG  Caps Take 50 mg by mouth daily.       Allergies:  Allergies  Allergen Reactions  . Codeine Nausea And Vomiting  . Hydrocodone Nausea And Vomiting    Past Medical History, Surgical history, Social history, and Family History were reviewed and updated.  Review of Systems: Review of Systems  Constitutional: Negative.   HENT: Negative.   Eyes: Negative.   Respiratory: Negative.   Cardiovascular: Negative.   Gastrointestinal: Negative.   Genitourinary: Negative.   Musculoskeletal: Positive for joint pain.  Skin: Negative.   Neurological: Positive for focal weakness.  Endo/Heme/Allergies: Negative.   Psychiatric/Behavioral: Negative.      Physical Exam:  weight is 157 lb 12.8 oz (71.6 kg). His oral temperature is 98.5 F (36.9 C). His blood pressure is 124/66 and his pulse is 99. His respiration is 19.   Wt Readings from Last 3 Encounters:  04/20/20 157 lb 12.8 oz (71.6 kg)  04/20/20 157 lb 12.8 oz (71.6 kg)  03/30/20 154 lb 1.3 oz (69.9 kg)    Physical Exam Vitals reviewed.  HENT:     Head: Normocephalic and atraumatic.  Eyes:     Pupils: Pupils are equal, round, and reactive to light.  Cardiovascular:     Rate and Rhythm: Normal rate and regular rhythm.     Heart sounds: Normal heart sounds.  Pulmonary:     Effort: Pulmonary effort is normal.     Breath sounds: Normal breath sounds.  Abdominal:     General: Bowel sounds are normal.     Palpations: Abdomen is soft.  Musculoskeletal:        General: No tenderness or deformity. Normal range of motion.     Cervical back: Normal range of motion.  Lymphadenopathy:     Cervical: No cervical adenopathy.  Skin:    General: Skin is warm and dry.     Findings: No erythema or rash.  Neurological:     Mental Status: He is alert and oriented to person, place, and time.  Psychiatric:        Behavior: Behavior normal.        Thought Content: Thought content normal.        Judgment: Judgment normal.    Lab Results   Component Value Date   WBC 8.6 04/20/2020   HGB 10.6 (L) 04/20/2020   HCT 32.2 (L) 04/20/2020   MCV 102.5 (H) 04/20/2020   PLT 254 04/20/2020   Lab Results  Component Value Date   FERRITIN 478 (H) 03/09/2020   IRON 61 03/09/2020   TIBC 258 03/09/2020   UIBC 196 03/09/2020  IRONPCTSAT 24 03/09/2020   Lab Results  Component Value Date   RETICCTPCT 1.6 04/08/2019   RBC 3.14 (L) 04/20/2020   No results found for: KPAFRELGTCHN, LAMBDASER, KAPLAMBRATIO No results found for: IGGSERUM, IGA, IGMSERUM No results found for: Kathrynn Ducking, MSPIKE, SPEI   Chemistry      Component Value Date/Time   NA 136 04/20/2020 0858   NA 144 12/14/2016 1159   NA 140 10/22/2016 1129   K 3.8 04/20/2020 0858   K 3.5 12/14/2016 1159   K 4.3 10/22/2016 1129   CL 101 04/20/2020 0858   CL 103 12/14/2016 1159   CO2 26 04/20/2020 0858   CO2 30 12/14/2016 1159   CO2 27 10/22/2016 1129   BUN 34 (H) 04/20/2020 0858   BUN 16 12/14/2016 1159   BUN 14.0 10/22/2016 1129   CREATININE 1.45 (H) 04/20/2020 0858   CREATININE 1.3 (H) 12/14/2016 1159   CREATININE 1.1 10/22/2016 1129      Component Value Date/Time   CALCIUM 9.4 04/20/2020 0858   CALCIUM 10.0 12/14/2016 1159   CALCIUM 10.4 10/22/2016 1129   ALKPHOS 46 04/20/2020 0858   ALKPHOS 48 12/14/2016 1159   ALKPHOS 60 10/22/2016 1129   AST 13 (L) 04/20/2020 0858   AST 16 10/22/2016 1129   ALT 20 04/20/2020 0858   ALT 23 12/14/2016 1159   ALT 14 10/22/2016 1129   BILITOT 0.3 04/20/2020 0858   BILITOT 0.31 10/22/2016 1129       Impression and Plan: MichaelWrightis 75 yo caucasian gentleman with metastatic neuroendocrine carcinoma of unknown primary.  Everything has been holding pretty steady except for down of the sacral area.  We given him some localized radiation therapy to this area to help prevent problems.  So far it seems to be helping.  We will go ahead with his 18th cycle of Lurbinectedin.  He  will get his Lupron and Xgeva today also.  I we will plan to see him back after he gets home from his trip down to [place].  I hope that he has a wonderful time.  In May, we probably will move ahead with his next set of scans.  [name], MD 4/6/202212:14 PM

## 2020-04-21 ENCOUNTER — Ambulatory Visit
Admission: RE | Admit: 2020-04-21 | Discharge: 2020-04-21 | Disposition: A | Discharge status: Home / Self Care | Payer: Medicare Other | Source: Ambulatory Visit | Attending: Radiation Oncology | Admitting: Radiation Oncology

## 2020-04-21 ENCOUNTER — Other Ambulatory Visit: Payer: Self-pay

## 2020-04-21 DIAGNOSIS — C7A8 Other malignant neuroendocrine tumors: Secondary | ICD-10-CM | POA: Diagnosis not present

## 2020-04-21 DIAGNOSIS — G893 Neoplasm related pain (acute) (chronic): Secondary | ICD-10-CM | POA: Diagnosis not present

## 2020-04-21 DIAGNOSIS — C7B8 Other secondary neuroendocrine tumors: Secondary | ICD-10-CM | POA: Diagnosis not present

## 2020-04-21 DIAGNOSIS — C61 Malignant neoplasm of prostate: Secondary | ICD-10-CM | POA: Diagnosis not present

## 2020-04-21 DIAGNOSIS — C7951 Secondary malignant neoplasm of bone: Secondary | ICD-10-CM | POA: Diagnosis not present

## 2020-04-21 LAB — PSA, TOTAL AND FREE
PSA, Free Pct: UNDETERMINED %
PSA, Free: <0.01 ng/mL
Prostate Specific Ag, Serum: <0.1 ng/mL (ref 0.0–4.0)

## 2020-04-21 LAB — TESTOSTERONE: Testosterone: <3 ng/dL — ABNORMAL LOW (ref 264–916)

## 2020-04-22 ENCOUNTER — Ambulatory Visit
Admission: RE | Admit: 2020-04-22 | Discharge: 2020-04-22 | Disposition: A | Discharge status: Home / Self Care | Payer: Medicare Other | Source: Ambulatory Visit | Attending: Radiation Oncology | Admitting: Radiation Oncology

## 2020-04-22 DIAGNOSIS — C61 Malignant neoplasm of prostate: Secondary | ICD-10-CM | POA: Diagnosis not present

## 2020-04-22 DIAGNOSIS — C7A8 Other malignant neuroendocrine tumors: Secondary | ICD-10-CM | POA: Diagnosis not present

## 2020-04-22 DIAGNOSIS — G893 Neoplasm related pain (acute) (chronic): Secondary | ICD-10-CM | POA: Diagnosis not present

## 2020-04-22 DIAGNOSIS — C7951 Secondary malignant neoplasm of bone: Secondary | ICD-10-CM | POA: Diagnosis not present

## 2020-04-22 DIAGNOSIS — C7B8 Other secondary neuroendocrine tumors: Secondary | ICD-10-CM | POA: Diagnosis not present

## 2020-04-25 ENCOUNTER — Other Ambulatory Visit: Payer: Self-pay

## 2020-04-25 ENCOUNTER — Other Ambulatory Visit (HOSPITAL_BASED_OUTPATIENT_CLINIC_OR_DEPARTMENT_OTHER): Payer: Self-pay

## 2020-04-25 ENCOUNTER — Ambulatory Visit
Admission: RE | Admit: 2020-04-25 | Discharge: 2020-04-25 | Disposition: A | Discharge status: Home / Self Care | Payer: Medicare Other | Source: Ambulatory Visit | Attending: Radiation Oncology | Admitting: Radiation Oncology

## 2020-04-25 DIAGNOSIS — C7B8 Other secondary neuroendocrine tumors: Secondary | ICD-10-CM | POA: Diagnosis not present

## 2020-04-25 DIAGNOSIS — C61 Malignant neoplasm of prostate: Secondary | ICD-10-CM | POA: Diagnosis not present

## 2020-04-25 DIAGNOSIS — C7951 Secondary malignant neoplasm of bone: Secondary | ICD-10-CM | POA: Diagnosis not present

## 2020-04-25 DIAGNOSIS — C7A8 Other malignant neuroendocrine tumors: Secondary | ICD-10-CM | POA: Diagnosis not present

## 2020-04-25 DIAGNOSIS — G893 Neoplasm related pain (acute) (chronic): Secondary | ICD-10-CM | POA: Diagnosis not present

## 2020-04-25 MED FILL — Pantoprazole Sodium EC Tab 40 MG (Base Equiv): ORAL | 30 days supply | Qty: 30 | Fill #0 | Status: AC

## 2020-04-26 ENCOUNTER — Ambulatory Visit
Admission: RE | Admit: 2020-04-26 | Discharge: 2020-04-26 | Disposition: A | Discharge status: Home / Self Care | Payer: Medicare Other | Source: Ambulatory Visit | Attending: Radiation Oncology | Admitting: Radiation Oncology

## 2020-04-26 DIAGNOSIS — C7B8 Other secondary neuroendocrine tumors: Secondary | ICD-10-CM | POA: Diagnosis not present

## 2020-04-26 DIAGNOSIS — G893 Neoplasm related pain (acute) (chronic): Secondary | ICD-10-CM | POA: Diagnosis not present

## 2020-04-26 DIAGNOSIS — C7A8 Other malignant neuroendocrine tumors: Secondary | ICD-10-CM | POA: Diagnosis not present

## 2020-04-26 DIAGNOSIS — C61 Malignant neoplasm of prostate: Secondary | ICD-10-CM | POA: Diagnosis not present

## 2020-04-26 DIAGNOSIS — C7951 Secondary malignant neoplasm of bone: Secondary | ICD-10-CM | POA: Diagnosis not present

## 2020-04-27 ENCOUNTER — Ambulatory Visit
Admission: RE | Admit: 2020-04-27 | Discharge: 2020-04-27 | Disposition: A | Discharge status: Home / Self Care | Payer: Medicare Other | Source: Ambulatory Visit | Attending: Radiation Oncology | Admitting: Radiation Oncology

## 2020-04-27 ENCOUNTER — Encounter: Payer: Self-pay | Admitting: Radiation Oncology

## 2020-04-27 DIAGNOSIS — C61 Malignant neoplasm of prostate: Secondary | ICD-10-CM | POA: Diagnosis not present

## 2020-04-27 DIAGNOSIS — G893 Neoplasm related pain (acute) (chronic): Secondary | ICD-10-CM | POA: Diagnosis not present

## 2020-04-27 DIAGNOSIS — C7A8 Other malignant neuroendocrine tumors: Secondary | ICD-10-CM | POA: Diagnosis not present

## 2020-04-27 DIAGNOSIS — C7B8 Other secondary neuroendocrine tumors: Secondary | ICD-10-CM | POA: Diagnosis not present

## 2020-04-27 DIAGNOSIS — C7951 Secondary malignant neoplasm of bone: Secondary | ICD-10-CM | POA: Diagnosis not present

## 2020-05-10 ENCOUNTER — Other Ambulatory Visit (HOSPITAL_BASED_OUTPATIENT_CLINIC_OR_DEPARTMENT_OTHER): Payer: Self-pay

## 2020-05-10 ENCOUNTER — Encounter: Payer: Self-pay | Admitting: Hematology & Oncology

## 2020-05-10 ENCOUNTER — Telehealth: Payer: Self-pay

## 2020-05-10 ENCOUNTER — Other Ambulatory Visit: Payer: Self-pay

## 2020-05-10 ENCOUNTER — Inpatient Hospital Stay (HOSPITAL_BASED_OUTPATIENT_CLINIC_OR_DEPARTMENT_OTHER): Payer: Medicare Other | Admitting: Hematology & Oncology

## 2020-05-10 ENCOUNTER — Inpatient Hospital Stay: Payer: Medicare Other

## 2020-05-10 VITALS — BP 124/73 | HR 89 | Temp 97.9°F | Resp 18 | Wt 161.0 lb

## 2020-05-10 DIAGNOSIS — M2550 Pain in unspecified joint: Secondary | ICD-10-CM | POA: Diagnosis not present

## 2020-05-10 DIAGNOSIS — C61 Malignant neoplasm of prostate: Secondary | ICD-10-CM | POA: Diagnosis not present

## 2020-05-10 DIAGNOSIS — C7A8 Other malignant neuroendocrine tumors: Secondary | ICD-10-CM

## 2020-05-10 DIAGNOSIS — C7951 Secondary malignant neoplasm of bone: Secondary | ICD-10-CM

## 2020-05-10 DIAGNOSIS — R531 Weakness: Secondary | ICD-10-CM | POA: Diagnosis not present

## 2020-05-10 DIAGNOSIS — Z5111 Encounter for antineoplastic chemotherapy: Secondary | ICD-10-CM | POA: Diagnosis not present

## 2020-05-10 DIAGNOSIS — C7B8 Other secondary neuroendocrine tumors: Secondary | ICD-10-CM

## 2020-05-10 DIAGNOSIS — D508 Other iron deficiency anemias: Secondary | ICD-10-CM | POA: Diagnosis not present

## 2020-05-10 DIAGNOSIS — K909 Intestinal malabsorption, unspecified: Secondary | ICD-10-CM | POA: Diagnosis not present

## 2020-05-10 LAB — CMP (CANCER CENTER ONLY)
ALT: 18 U/L (ref 0–44)
AST: 16 U/L (ref 15–41)
Albumin: 3.9 g/dL (ref 3.5–5.0)
Alkaline Phosphatase: 46 U/L (ref 38–126)
Anion gap: 9 (ref 5–15)
BUN: 18 mg/dL (ref 8–23)
CO2: 26 mmol/L (ref 22–32)
Calcium: 9.2 mg/dL (ref 8.9–10.3)
Chloride: 103 mmol/L (ref 98–111)
Creatinine: 1.37 mg/dL — ABNORMAL HIGH (ref 0.61–1.24)
GFR, Estimated: 55 mL/min — ABNORMAL LOW (ref >60)
Glucose, Bld: 183 mg/dL — ABNORMAL HIGH (ref 70–99)
Potassium: 3.9 mmol/L (ref 3.5–5.1)
Sodium: 138 mmol/L (ref 135–145)
Total Bilirubin: 0.2 mg/dL — ABNORMAL LOW (ref 0.3–1.2)
Total Protein: 6.4 g/dL — ABNORMAL LOW (ref 6.5–8.1)

## 2020-05-10 LAB — CBC WITH DIFFERENTIAL (CANCER CENTER ONLY)
Abs Immature Granulocytes: 0.06 10*3/uL (ref 0.00–0.07)
Basophils Absolute: 0 10*3/uL (ref 0.0–0.1)
Basophils Relative: 0 %
Eosinophils Absolute: 0.1 10*3/uL (ref 0.0–0.5)
Eosinophils Relative: 1 %
HCT: 28.8 % — ABNORMAL LOW (ref 39.0–52.0)
Hemoglobin: 9.1 g/dL — ABNORMAL LOW (ref 13.0–17.0)
Immature Granulocytes: 1 %
Lymphocytes Relative: 14 %
Lymphs Abs: 0.7 10*3/uL (ref 0.7–4.0)
MCH: 33.1 pg (ref 26.0–34.0)
MCHC: 31.6 g/dL (ref 30.0–36.0)
MCV: 104.7 fL — ABNORMAL HIGH (ref 80.0–100.0)
Monocytes Absolute: 0.7 10*3/uL (ref 0.1–1.0)
Monocytes Relative: 15 %
Neutro Abs: 3.3 10*3/uL (ref 1.7–7.7)
Neutrophils Relative %: 69 %
Platelet Count: 244 10*3/uL (ref 150–400)
RBC: 2.75 MIL/uL — ABNORMAL LOW (ref 4.22–5.81)
RDW: 15.9 % — ABNORMAL HIGH (ref 11.5–15.5)
WBC Count: 4.9 10*3/uL (ref 4.0–10.5)
nRBC: 0 % (ref 0.0–0.2)

## 2020-05-10 LAB — LACTATE DEHYDROGENASE: LDH: 238 U/L — ABNORMAL HIGH (ref 98–192)

## 2020-05-10 MED ORDER — AMOXICILLIN 500 MG PO CAPS
2000.0000 mg | ORAL_CAPSULE | Freq: Once | ORAL | 0 refills | Status: AC
  Filled 2020-05-10: qty 4, 1d supply, fill #0

## 2020-05-10 MED ORDER — SODIUM CHLORIDE 0.9 % IV SOLN
10.0000 mg | Freq: Once | INTRAVENOUS | Status: AC
  Administered 2020-05-10: 10 mg via INTRAVENOUS
  Filled 2020-05-10: qty 10

## 2020-05-10 MED ORDER — SODIUM CHLORIDE 0.9 % IV SOLN
3.2000 mg/m2 | Freq: Once | INTRAVENOUS | Status: AC
  Administered 2020-05-10: 5.8 mg via INTRAVENOUS
  Filled 2020-05-10: qty 11.6

## 2020-05-10 MED ORDER — SODIUM CHLORIDE 0.9% FLUSH
10.0000 mL | INTRAVENOUS | Status: DC | PRN
  Administered 2020-05-10: 10 mL
  Filled 2020-05-10: qty 10

## 2020-05-10 MED ORDER — PALONOSETRON HCL INJECTION 0.25 MG/5ML
0.2500 mg | Freq: Once | INTRAVENOUS | Status: AC
Start: 2020-05-10 — End: 2020-05-10
  Administered 2020-05-10: 0.25 mg via INTRAVENOUS

## 2020-05-10 MED ORDER — PALONOSETRON HCL INJECTION 0.25 MG/5ML
INTRAVENOUS | Status: AC
  Filled 2020-05-10: qty 5

## 2020-05-10 MED ORDER — SODIUM CHLORIDE 0.9 % IV SOLN
Freq: Once | INTRAVENOUS | Status: AC
Start: 2020-05-10 — End: 2020-05-10
  Filled 2020-05-10: qty 250

## 2020-05-10 MED ORDER — HEPARIN SOD (PORK) LOCK FLUSH 100 UNIT/ML IV SOLN
500.0000 [IU] | Freq: Once | INTRAVENOUS | Status: AC | PRN
  Administered 2020-05-10: 500 [IU]
  Filled 2020-05-10: qty 5

## 2020-05-10 NOTE — Patient Instructions (Signed)
Implanted Port Insertion, Care After This sheet gives you information about how to care for yourself after your procedure. Your health care provider may also give you more specific instructions. If you have problems or questions, contact your health care provider. What can I expect after the procedure? After the procedure, it is common to have:  Discomfort at the port insertion site.  Bruising on the skin over the port. This should improve over 3-4 days. Follow these instructions at home: Port care  After your port is placed, you will get a manufacturer's information card. The card has information about your port. Keep this card with you at all times.  Take care of the port as told by your health care provider. Ask your health care provider if you or a family member can get training for taking care of the port at home. A home health care nurse may also take care of the port.  Make sure to remember what type of port you have. Incision care  Follow instructions from your health care provider about how to take care of your port insertion site. Make sure you: ? Wash your hands with soap and water before and after you change your bandage (dressing). If soap and water are not available, use hand sanitizer. ? Change your dressing as told by your health care provider. ? Leave stitches (sutures), skin glue, or adhesive strips in place. These skin closures may need to stay in place for 2 weeks or longer. If adhesive strip edges start to loosen and curl up, you may trim the loose edges. Do not remove adhesive strips completely unless your health care provider tells you to do that.  Check your port insertion site every day for signs of infection. Check for: ? Redness, swelling, or pain. ? Fluid or blood. ? Warmth. ? Pus or a bad smell.      Activity  Return to your normal activities as told by your health care provider. Ask your health care provider what activities are safe for you.  Do not  lift anything that is heavier than 10 lb (4.5 kg), or the limit that you are told, until your health care provider says that it is safe. General instructions  Take over-the-counter and prescription medicines only as told by your health care provider.  Do not take baths, swim, or use a hot tub until your health care provider approves. Ask your health care provider if you may take showers. You may only be allowed to take sponge baths.  Do not drive for 24 hours if you were given a sedative during your procedure.  Wear a medical alert bracelet in case of an emergency. This will tell any health care providers that you have a port.  Keep all follow-up visits as told by your health care provider. This is important. Contact a health care provider if:  You cannot flush your port with saline as directed, or you cannot draw blood from the port.  You have a fever or chills.  You have redness, swelling, or pain around your port insertion site.  You have fluid or blood coming from your port insertion site.  Your port insertion site feels warm to the touch.  You have pus or a bad smell coming from the port insertion site. Get help right away if:  You have chest pain or shortness of breath.  You have bleeding from your port that you cannot control. Summary  Take care of the port as told by your   health care provider. Keep the manufacturer's information card with you at all times.  Change your dressing as told by your health care provider.  Contact a health care provider if you have a fever or chills or if you have redness, swelling, or pain around your port insertion site.  Keep all follow-up visits as told by your health care provider. This information is not intended to replace advice given to you by your health care provider. Make sure you discuss any questions you have with your health care provider. Document Revised: 07/30/2017 Document Reviewed: 07/30/2017 Elsevier Patient Education   2021 [employer] Inc.  

## 2020-05-10 NOTE — Telephone Encounter (Signed)
05/10/20 los noted, pt already on sch, pet and mri orders noted     [name]

## 2020-05-10 NOTE — Progress Notes (Signed)
Hematology and Oncology Follow Up Visit  [name] [id number] 08/02/1945 75 y.o. 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 Iron def anemia -- malabsorption  Past Therapy: Zytiga 1000 mg by mouth daily - discontinued on 08/15/2016 Xtandi 160 mg po q day - start on 08/15/2016 - discontinued Radium-223 therapy -- s/p cycle #6 Palliative radiation to the right sacrum Lutathera - s/p cycle 4/4 --last dose on 07/11/2017 Carbo/VP-16/Tecentriq -- S/p cycle #5 Keytruda q 6 week -- Maintanence -- s/p cycle #3 - changed from 3 to 6 week on 06/30/2018 Taxotere/Keytruda -- start on 09/17/2018 -- s/p cycle #10 -- d/c due to progression  Current Therapy: Lurbinectedin --started on 04/29/2019, s/p cycle 16 Xgeva 120 mg subcutaneous Q3 months - due in 07/2020 Lupron 22 mg IM every 3 months - due in 74/2022  SBRT to sacral lesion  IV iron as indicated    Interim History:  [name] is here today for follow-up and treatment.  He seems to doing pretty well.  He and his wife had a wonderful trip down to [place].  They visited some family.  They probably traveled close to 1000 miles.  He had no problems while traveling.  He completed radiation therapy to the sacrum.  He completed this a few weeks ago.  He says this has helped the pain.  He has had no cough.  He has had no nausea or vomiting.  There is been no issues with bowels or bladder.  He has had no dysuria.  He has had no hematuria.  There is been no problems with leg swelling.  He does have the brace on his right leg.  He has had no headache.  He says he is going to a chiropractor tomorrow because of a tightness in his neck.  He said he woke up with this.  His appetite has been quite good.  He has had no rashes.  He has not noted any swollen lymph nodes.  Overall, his performance status is ECOG 1.      Medications:  Allergies as of 05/10/2020      Reactions   Codeine Nausea And Vomiting   Hydrocodone Nausea And Vomiting      Medication List       Accurate as of May 10, 2020  1:15 PM. If you have any questions, ask your nurse or doctor.        amoxicillin 500 MG capsule Commonly known as: AMOXIL TAKE 4 CAPSULE BY MOUTH ONCE FOR 1 DOSE 1 HOUR PRIOR TO DENTAL PROCEDURE   aspirin 81 MG tablet Take 81 mg by mouth daily.   CALCIUM-IRON-VIT D-VIT K PO Take 1 tablet by mouth 2 (two) times daily.   dexamethasone 4 MG tablet Commonly known as: DECADRON TAKE 5 TABLETS BY MOUTH DAILY **TAKE WITH FOOD** What changed:   how much to take  when to take this   gabapentin 300 MG capsule Commonly known as: NEURONTIN TAKE 1 CAPSULE BY MOUTH TWICE A DAY THEN TAKE 2 CAPSULES AT BEDTIME What changed: additional instructions   Generlac 10 GM/15ML Soln Generic drug: lactulose (encephalopathy) daily as needed.   isosorbide mononitrate 30 MG 24 hr tablet Commonly known as: IMDUR TAKE 1 TABLET (30 MG TOTAL) BY MOUTH DAILY.   lidocaine-prilocaine cream Commonly known as: EMLA   OVER THE COUNTER MEDICATION every morning. Juice Plus -- 8 capsule of each the garden, vineyard, and orchard twice  a day.   [place] Calcium/D 250-125 MG-UNIT Tabs Take by mouth daily.   pantoprazole 40 MG tablet Commonly known as: PROTONIX TAKE 1 TABLET (40 MG TOTAL) BY MOUTH AT BEDTIME.   prochlorperazine 10 MG tablet Commonly known as: COMPAZINE TAKE 1 TABLET (10 MG TOTAL) BY MOUTH EVERY 6 (SIX) HOURS AS NEEDED (NAUSEA OR VOMITING).   senna-docusate 8.6-50 MG tablet Commonly known as: Senokot-S Take 2-4 tablets by mouth at bedtime.   Zetia 10 MG tablet Generic drug: ezetimibe Take 10 mg by mouth daily.   ezetimibe 10 MG tablet Commonly known as: ZETIA TAKE 1 TABLET BY MOUTH DAILY   Zinc 50 MG Caps Take 50 mg by mouth daily.       Allergies:  Allergies  Allergen Reactions   . Codeine Nausea And Vomiting  . Hydrocodone Nausea And Vomiting    Past Medical History, Surgical history, Social history, and Family History were reviewed and updated.  Review of Systems: Review of Systems  Constitutional: Negative.   HENT: Negative.   Eyes: Negative.   Respiratory: Negative.   Cardiovascular: Negative.   Gastrointestinal: Negative.   Genitourinary: Negative.   Musculoskeletal: Positive for joint pain.  Skin: Negative.   Neurological: Positive for focal weakness.  Endo/Heme/Allergies: Negative.   Psychiatric/Behavioral: Negative.      Physical Exam:  weight is 161 lb (73 kg). His oral temperature is 97.9 F (36.6 C). His blood pressure is 124/73 and his pulse is 89. His respiration is 18 and oxygen saturation is 100%.   Wt Readings from Last 3 Encounters:  05/10/20 161 lb (73 kg)  04/20/20 157 lb 12.8 oz (71.6 kg)  04/20/20 157 lb 12.8 oz (71.6 kg)    Physical Exam Vitals reviewed.  HENT:     Head: Normocephalic and atraumatic.  Eyes:     Pupils: Pupils are equal, round, and reactive to light.  Cardiovascular:     Rate and Rhythm: Normal rate and regular rhythm.     Heart sounds: Normal heart sounds.  Pulmonary:     Effort: Pulmonary effort is normal.     Breath sounds: Normal breath sounds.  Abdominal:     General: Bowel sounds are normal.     Palpations: Abdomen is soft.  Musculoskeletal:        General: No tenderness or deformity. Normal range of motion.     Cervical back: Normal range of motion.  Lymphadenopathy:     Cervical: No cervical adenopathy.  Skin:    General: Skin is warm and dry.     Findings: No erythema or rash.  Neurological:     Mental Status: He is alert and oriented to person, place, and time.  Psychiatric:        Behavior: Behavior normal.        Thought Content: Thought content normal.        Judgment: Judgment normal.    Lab Results  Component Value Date   WBC 4.9 05/10/2020   HGB 9.1 (L) 05/10/2020   HCT  28.8 (L) 05/10/2020   MCV 104.7 (H) 05/10/2020   PLT 244 05/10/2020   Lab Results  Component Value Date   FERRITIN 353 (H) 04/20/2020   IRON 116 04/20/2020   TIBC 285 04/20/2020   UIBC 169 04/20/2020   IRONPCTSAT 41 04/20/2020   Lab Results  Component Value Date   RETICCTPCT 1.6 04/08/2019   RBC 2.75 (L) 05/10/2020   No results found for: KPAFRELGTCHN, LAMBDASER, KAPLAMBRATIO No results found for:  IGGSERUM, IGA, IGMSERUM No results found for: Odetta Pink, SPEI   Chemistry      Component Value Date/Time   NA 138 05/10/2020 1015   NA 144 12/14/2016 1159   NA 140 10/22/2016 1129   K 3.9 05/10/2020 1015   K 3.5 12/14/2016 1159   K 4.3 10/22/2016 1129   CL 103 05/10/2020 1015   CL 103 12/14/2016 1159   CO2 26 05/10/2020 1015   CO2 30 12/14/2016 1159   CO2 27 10/22/2016 1129   BUN 18 05/10/2020 1015   BUN 16 12/14/2016 1159   BUN 14.0 10/22/2016 1129   CREATININE 1.37 (H) 05/10/2020 1015   CREATININE 1.3 (H) 12/14/2016 1159   CREATININE 1.1 10/22/2016 1129      Component Value Date/Time   CALCIUM 9.2 05/10/2020 1015   CALCIUM 10.0 12/14/2016 1159   CALCIUM 10.4 10/22/2016 1129   ALKPHOS 46 05/10/2020 1015   ALKPHOS 48 12/14/2016 1159   ALKPHOS 60 10/22/2016 1129   AST 16 05/10/2020 1015   AST 16 10/22/2016 1129   ALT 18 05/10/2020 1015   ALT 23 12/14/2016 1159   ALT 14 10/22/2016 1129   BILITOT 0.2 (L) 05/10/2020 1015   BILITOT 0.31 10/22/2016 1129       Impression and Plan: MichaelWrightis 75 yo caucasian gentleman with metastatic neuroendocrine carcinoma of unknown primary.  Everything has been holding pretty steady except for down of the sacral area.  We have given him some localized radiation therapy to this area to help prevent problems.  So far it seems to be helping.  We will go ahead with his 19th cycle of Lurbinectedin.  I will set him up with his scans in May.  I will set him up with the nuclear  medicine scan for the neuroendocrine carcinoma.  He will also need an MRI for the sacral area.  Hopefully, we will see that the radiation has helped nicely with the sacral lesion.  Again, we are dealing with his quality of life issues.  So far, his quality life seems to be doing quite nicely.  I will plan to get him back in another month.    [name], MD 4/26/20221:15 PM

## 2020-05-10 NOTE — Patient Instructions (Signed)
Lurbinectedin Injection What is this medicine? LURBINECTEDIN (LOOR bin EK te din) is a chemotherapy drug. This medicine is used to treat lung cancer. This medicine may be used for other purposes; ask your health care provider or pharmacist if you have questions. COMMON BRAND NAME(S): ZEPZELCA What should I tell my health care provider before I take this medicine? They need to know if you have any of these conditions:  infection (especially a viral infection such as chickenpox, cold sores, or herpes)  liver disease  low blood counts, like white cells, platelets, or red blood cells  an unusual or allergic reaction to lurbinectedin, other medicines, foods, dyes or preservatives  pregnant or trying to get pregnant  breast-feeding How should I use this medicine? This medicine is for infusion into a vein. It is given by a healthcare professional in a hospital or clinic setting. Talk to your pediatrician about the use of this medicine in children. Special care may be needed. Overdosage: If you think you have taken too much of this medicine contact a poison control center or emergency room at once. NOTE: This medicine is only for you. Do not share this medicine with others. What if I miss a dose? Keep appointments for follow-up doses. It is important not to miss your dose. Call your doctor or health care professional if you are unable to keep an appointment. What may interact with this medicine?  certain antibiotics like erythromycin or clarithromycin  certain antivirals for HIV or hepatitis  certain medicines for fungal infections like ketoconazole, itraconazole, or posaconazole  certain medicines for seizures like carbamazepine, phenobarbital, phenytoin  grapefruit or grapefruit juice  St. John's Wort This list may not describe all possible interactions. Give your health care provider a list of all the medicines, herbs, non-prescription drugs, or dietary supplements you use. Also  tell them if you smoke, drink alcohol, or use illegal drugs. Some items may interact with your medicine. What should I watch for while using this medicine? Your condition will be monitored carefully while you are receiving this medicine. Do not become pregnant while taking this medicine or for 6 months after stopping it. Women should inform their health care professional if they wish to become pregnant or think they might be pregnant. Men should not father a child while taking this medicine and for 4 months after stopping it. There is potential for serious side effects to an unborn child. Talk to your health care professional for more information. Do not breast-feed a child while taking this medicine or for 2 weeks after stopping it. Call your health care professional for advice if you get a fever, chills, or sore throat, or other symptoms of a cold or flu. Do not treat yourself. This medicine decreases your body's ability to fight infections. Try to avoid being around people who are sick. Avoid taking medicines that contain aspirin, acetaminophen, ibuprofen, naproxen, or ketoprofen unless instructed by your health care professional. These medicines may hide a fever. Be careful brushing or flossing your teeth or using a toothpick because you may get an infection or bleed more easily. If you have any dental work done, tell your dentist you are receiving this medicine. What side effects may I notice from receiving this medicine? Side effects that you should report to your doctor or health care professional as soon as possible:  allergic reactions like skin rash, itching or hives; swelling of the face, lips, or tongue  chest pain  nausea, vomiting  signs and symptoms of  bleeding such as bloody or black, tarry stools; red or dark-brown urine; spitting up blood or brown material that looks like coffee grounds; red spots on the skin; unusual bruising or bleeding from the eyes, gums, or nose  signs and  symptoms of infection like fever; chills; cough; sore throat; pain or trouble passing urine  signs and symptoms of liver injury like dark yellow or brown urine; general ill feeling or flu-like symptoms; light-colored stools; loss of appetite; nausea; right upper belly pain; unusually weak or tired; yellowing of the eyes or skin Side effects that usually do not require medical attention (report these to your doctor or health care professional if they continue or are bothersome):  changes in taste  constipation  diarrhea  loss of appetite  muscle pain  pain, tingling, numbness in the hands or feet  signs and symptoms of high blood sugar such as being more thirsty or hungry or having to urinate more than normal. You may also feel very tired or have blurry vision.  signs and symptoms of low magnesium like muscle cramps; muscle pain; muscle weakness; tremors; seizures; or fast, irregular heartbeat  signs and symptoms of low red blood cells or anemia such as unusually weak or tired; feeling faint or lightheaded; falls; breathing problems  stomach pain This list may not describe all possible side effects. Call your doctor for medical advice about side effects. You may report side effects to FDA at 1-800-FDA-1088. Where should I keep my medicine? This medicine is given in a hospital or clinic and will not be stored at home. NOTE: This sheet is a summary. It may not cover all possible information. If you have questions about this medicine, talk to your doctor, pharmacist, or health care provider.  2021 [employer]vier/Gold Standard (2018-07-03 13:00:33)  Denosumab injection What is this medicine? DENOSUMAB (den oh sue mab) slows bone breakdown. Prolia is used to treat osteoporosis in women after menopause and in men, and in people who are taking corticosteroids for 6 months or more. Delton See is used to treat a high calcium level due to cancer and to prevent bone fractures and other bone problems caused  by multiple myeloma or cancer bone metastases. Delton See is also used to treat giant cell tumor of the bone. This medicine may be used for other purposes; ask your health care provider or pharmacist if you have questions. COMMON BRAND NAME(S): Prolia, XGEVA What should I tell my health care provider before I take this medicine? They need to know if you have any of these conditions:  dental disease  having surgery or tooth extraction  infection  kidney disease  low levels of calcium or Vitamin D in the blood  malnutrition  on hemodialysis  skin conditions or sensitivity  thyroid or parathyroid disease  an unusual reaction to denosumab, other medicines, foods, dyes, or preservatives  pregnant or trying to get pregnant  breast-feeding How should I use this medicine? This medicine is for injection under the skin. It is given by a health care professional in a hospital or clinic setting. A special MedGuide will be given to you before each treatment. Be sure to read this information carefully each time. For Prolia, talk to your pediatrician regarding the use of this medicine in children. Special care may be needed. For Delton See, talk to your pediatrician regarding the use of this medicine in children. While this drug may be prescribed for children as young as 13 years for selected conditions, precautions do apply. Overdosage: If you  think you have taken too much of this medicine contact a poison control center or emergency room at once. NOTE: This medicine is only for you. Do not share this medicine with others. What if I miss a dose? It is important not to miss your dose. Call your doctor or health care professional if you are unable to keep an appointment. What may interact with this medicine? Do not take this medicine with any of the following medications:  other medicines containing denosumab This medicine may also interact with the following medications:  medicines that lower your  chance of fighting infection  steroid medicines like prednisone or cortisone This list may not describe all possible interactions. Give your health care provider a list of all the medicines, herbs, non-prescription drugs, or dietary supplements you use. Also tell them if you smoke, drink alcohol, or use illegal drugs. Some items may interact with your medicine. What should I watch for while using this medicine? Visit your doctor or health care professional for regular checks on your progress. Your doctor or health care professional may order blood tests and other tests to see how you are doing. Call your doctor or health care professional for advice if you get a fever, chills or sore throat, or other symptoms of a cold or flu. Do not treat yourself. This drug may decrease your body's ability to fight infection. Try to avoid being around people who are sick. You should make sure you get enough calcium and vitamin D while you are taking this medicine, unless your doctor tells you not to. Discuss the foods you eat and the vitamins you take with your health care professional. See your dentist regularly. Brush and floss your teeth as directed. Before you have any dental work done, tell your dentist you are receiving this medicine. Do not become pregnant while taking this medicine or for 5 months after stopping it. Talk with your doctor or health care professional about your birth control options while taking this medicine. Women should inform their doctor if they wish to become pregnant or think they might be pregnant. There is a potential for serious side effects to an unborn child. Talk to your health care professional or pharmacist for more information. What side effects may I notice from receiving this medicine? Side effects that you should report to your doctor or health care professional as soon as possible:  allergic reactions like skin rash, itching or hives, swelling of the face, lips, or  tongue  bone pain  breathing problems  dizziness  jaw pain, especially after dental work  redness, blistering, peeling of the skin  signs and symptoms of infection like fever or chills; cough; sore throat; pain or trouble passing urine  signs of low calcium like fast heartbeat, muscle cramps or muscle pain; pain, tingling, numbness in the hands or feet; seizures  unusual bleeding or bruising  unusually weak or tired Side effects that usually do not require medical attention (report to your doctor or health care professional if they continue or are bothersome):  constipation  diarrhea  headache  joint pain  loss of appetite  muscle pain  runny nose  tiredness  upset stomach This list may not describe all possible side effects. Call your doctor for medical advice about side effects. You may report side effects to FDA at 1-800-FDA-1088. Where should I keep my medicine? This medicine is only given in a clinic, doctor's office, or other health care setting and will not be stored at  home. NOTE: This sheet is a summary. It may not cover all possible information. If you have questions about this medicine, talk to your doctor, pharmacist, or health care provider.  2021 [employer]/Gold Standard (2017-05-10 16:10:44)  Leuprolide depot injection What is this medicine? LEUPROLIDE (loo PROE lide) is a man-made protein that acts like a natural hormone in the body. It decreases testosterone in men and decreases estrogen in women. In men, this medicine is used to treat advanced prostate cancer. In women, some forms of this medicine may be used to treat endometriosis, uterine fibroids, or other male hormone-related problems. This medicine may be used for other purposes; ask your health care provider or pharmacist if you have questions. COMMON BRAND NAME(S): Eligard, Fensolv, Lupron Depot, Lupron Depot-Ped, Viadur What should I tell my health care provider before I take this  medicine? They need to know if you have any of these conditions:  diabetes  heart disease or previous heart attack  high blood pressure  high cholesterol  mental illness  osteoporosis  pain or difficulty passing urine  seizures  spinal cord metastasis  stroke  suicidal thoughts, plans, or attempt; a previous suicide attempt by you or a family member  tobacco smoker  unusual vaginal bleeding (women)  an unusual or allergic reaction to leuprolide, benzyl alcohol, other medicines, foods, dyes, or preservatives  pregnant or trying to get pregnant  breast-feeding How should I use this medicine? This medicine is for injection into a muscle or for injection under the skin. It is given by a health care professional in a hospital or clinic setting. The specific product will determine how it will be given to you. Make sure you understand which product you receive and how often you will receive it. Talk to your pediatrician regarding the use of this medicine in children. Special care may be needed. Overdosage: If you think you have taken too much of this medicine contact a poison control center or emergency room at once. NOTE: This medicine is only for you. Do not share this medicine with others. What if I miss a dose? It is important not to miss a dose. Call your doctor or health care professional if you are unable to keep an appointment. Depot injections: Depot injections are given either once-monthly, every 12 weeks, every 16 weeks, or every 24 weeks depending on the product you are prescribed. The product you are prescribed will be based on if you are male or male, and your condition. Make sure you understand your product and dosing. What may interact with this medicine? Do not take this medicine with any of the following medications:  chasteberry  cisapride  dronedarone  pimozide  thioridazine This medicine may also interact with the following medications:  herbal  or dietary supplements, like black cohosh or DHEA  male hormones, like estrogens or progestins and birth control pills, patches, rings, or injections  male hormones, like testosterone  other medicines that prolong the QT interval (abnormal heart rhythm) This list may not describe all possible interactions. Give your health care provider a list of all the medicines, herbs, non-prescription drugs, or dietary supplements you use. Also tell them if you smoke, drink alcohol, or use illegal drugs. Some items may interact with your medicine. What should I watch for while using this medicine? Visit your doctor or health care professional for regular checks on your progress. During the first weeks of treatment, your symptoms may get worse, but then will improve as you continue your treatment.  You may get hot flashes, increased bone pain, increased difficulty passing urine, or an aggravation of nerve symptoms. Discuss these effects with your doctor or health care professional, some of them may improve with continued use of this medicine. Male patients may experience a menstrual cycle or spotting during the first months of therapy with this medicine. If this continues, contact your doctor or health care professional. This medicine may increase blood sugar. Ask your healthcare provider if changes in diet or medicines are needed if you have diabetes. What side effects may I notice from receiving this medicine? Side effects that you should report to your doctor or health care professional as soon as possible:  allergic reactions like skin rash, itching or hives, swelling of the face, lips, or tongue  breathing problems  chest pain  depression or memory disorders  pain in your legs or groin  pain at site where injected or implanted  seizures  severe headache  signs and symptoms of high blood sugar such as being more thirsty or hungry or having to urinate more than normal. You may also feel very  tired or have blurry vision  swelling of the feet and legs  suicidal thoughts or other mood changes  visual changes  vomiting Side effects that usually do not require medical attention (report to your doctor or health care professional if they continue or are bothersome):  breast swelling or tenderness  decrease in sex drive or performance  diarrhea  hot flashes  loss of appetite  muscle, joint, or bone pains  nausea  redness or irritation at site where injected or implanted  skin problems or acne This list may not describe all possible side effects. Call your doctor for medical advice about side effects. You may report side effects to FDA at 1-800-FDA-1088. Where should I keep my medicine? This drug is given in a hospital or clinic and will not be stored at home. NOTE: This sheet is a summary. It may not cover all possible information. If you have questions about this medicine, talk to your doctor, pharmacist, or health care provider.  2021 [employer]/Gold Standard (2018-12-03 10:35:13)

## 2020-05-11 ENCOUNTER — Telehealth: Payer: Self-pay | Admitting: *Deleted

## 2020-05-11 LAB — IRON AND TIBC
Iron: 45 ug/dL (ref 42–163)
Saturation Ratios: 20 % (ref 20–55)
TIBC: 226 ug/dL (ref 202–409)
UIBC: 181 ug/dL (ref 117–376)

## 2020-05-11 LAB — FERRITIN: Ferritin: 491 ng/mL — ABNORMAL HIGH (ref 24–336)

## 2020-05-11 LAB — TESTOSTERONE: Testosterone: <3 ng/dL — ABNORMAL LOW (ref 264–916)

## 2020-05-11 NOTE — Telephone Encounter (Signed)
Call received from patient requesting that his most recent MRI from 03/19/20 be faxed to [facility] at [phone].  MRI faxed per pt.'s request.

## 2020-05-12 DIAGNOSIS — M9901 Segmental and somatic dysfunction of cervical region: Secondary | ICD-10-CM | POA: Diagnosis not present

## 2020-05-12 DIAGNOSIS — M9903 Segmental and somatic dysfunction of lumbar region: Secondary | ICD-10-CM | POA: Diagnosis not present

## 2020-05-12 DIAGNOSIS — M9902 Segmental and somatic dysfunction of thoracic region: Secondary | ICD-10-CM | POA: Diagnosis not present

## 2020-05-12 DIAGNOSIS — M9904 Segmental and somatic dysfunction of sacral region: Secondary | ICD-10-CM | POA: Diagnosis not present

## 2020-05-13 LAB — CHROMOGRANIN A: Chromogranin A (ng/mL): 1012 ng/mL — ABNORMAL HIGH (ref 0.0–101.8)

## 2020-05-16 DIAGNOSIS — M9904 Segmental and somatic dysfunction of sacral region: Secondary | ICD-10-CM | POA: Diagnosis not present

## 2020-05-16 DIAGNOSIS — M9901 Segmental and somatic dysfunction of cervical region: Secondary | ICD-10-CM | POA: Diagnosis not present

## 2020-05-16 DIAGNOSIS — M9902 Segmental and somatic dysfunction of thoracic region: Secondary | ICD-10-CM | POA: Diagnosis not present

## 2020-05-16 DIAGNOSIS — M9903 Segmental and somatic dysfunction of lumbar region: Secondary | ICD-10-CM | POA: Diagnosis not present

## 2020-05-17 ENCOUNTER — Inpatient Hospital Stay: Payer: Medicare Other | Attending: Hematology & Oncology

## 2020-05-17 ENCOUNTER — Other Ambulatory Visit: Payer: Self-pay

## 2020-05-17 DIAGNOSIS — R918 Other nonspecific abnormal finding of lung field: Secondary | ICD-10-CM | POA: Insufficient documentation

## 2020-05-17 DIAGNOSIS — M2550 Pain in unspecified joint: Secondary | ICD-10-CM | POA: Diagnosis not present

## 2020-05-17 DIAGNOSIS — D509 Iron deficiency anemia, unspecified: Secondary | ICD-10-CM | POA: Insufficient documentation

## 2020-05-17 DIAGNOSIS — R399 Unspecified symptoms and signs involving the genitourinary system: Secondary | ICD-10-CM

## 2020-05-17 DIAGNOSIS — R531 Weakness: Secondary | ICD-10-CM | POA: Diagnosis not present

## 2020-05-17 DIAGNOSIS — Z79899 Other long term (current) drug therapy: Secondary | ICD-10-CM | POA: Diagnosis not present

## 2020-05-17 DIAGNOSIS — K909 Intestinal malabsorption, unspecified: Secondary | ICD-10-CM | POA: Insufficient documentation

## 2020-05-17 DIAGNOSIS — C61 Malignant neoplasm of prostate: Secondary | ICD-10-CM | POA: Diagnosis not present

## 2020-05-17 DIAGNOSIS — Z885 Allergy status to narcotic agent status: Secondary | ICD-10-CM | POA: Insufficient documentation

## 2020-05-17 LAB — URINALYSIS, COMPLETE (UACMP) WITH MICROSCOPIC
Bilirubin Urine: NEGATIVE
Glucose, UA: NEGATIVE mg/dL
Hgb urine dipstick: NEGATIVE
Ketones, ur: NEGATIVE mg/dL
Leukocytes,Ua: NEGATIVE
Nitrite: NEGATIVE
Protein, ur: NEGATIVE mg/dL
Specific Gravity, Urine: 1.01 (ref 1.005–1.030)
pH: 7 (ref 5.0–8.0)

## 2020-05-18 ENCOUNTER — Telehealth: Payer: Self-pay | Admitting: *Deleted

## 2020-05-18 DIAGNOSIS — M9902 Segmental and somatic dysfunction of thoracic region: Secondary | ICD-10-CM | POA: Diagnosis not present

## 2020-05-18 DIAGNOSIS — M9901 Segmental and somatic dysfunction of cervical region: Secondary | ICD-10-CM | POA: Diagnosis not present

## 2020-05-18 DIAGNOSIS — M9903 Segmental and somatic dysfunction of lumbar region: Secondary | ICD-10-CM | POA: Diagnosis not present

## 2020-05-18 DIAGNOSIS — M9904 Segmental and somatic dysfunction of sacral region: Secondary | ICD-10-CM | POA: Diagnosis not present

## 2020-05-18 LAB — URINE CULTURE: Culture: NO GROWTH

## 2020-05-18 NOTE — Telephone Encounter (Signed)
-----   Message from [name], MD sent at 05/18/2020 12:45 PM EDT ----- Please call and let him know that the urine culture is negative.

## 2020-05-20 DIAGNOSIS — M9901 Segmental and somatic dysfunction of cervical region: Secondary | ICD-10-CM | POA: Diagnosis not present

## 2020-05-20 DIAGNOSIS — M9903 Segmental and somatic dysfunction of lumbar region: Secondary | ICD-10-CM | POA: Diagnosis not present

## 2020-05-20 DIAGNOSIS — M9904 Segmental and somatic dysfunction of sacral region: Secondary | ICD-10-CM | POA: Diagnosis not present

## 2020-05-20 DIAGNOSIS — M9902 Segmental and somatic dysfunction of thoracic region: Secondary | ICD-10-CM | POA: Diagnosis not present

## 2020-05-21 ENCOUNTER — Other Ambulatory Visit: Payer: Self-pay

## 2020-05-21 ENCOUNTER — Ambulatory Visit (HOSPITAL_BASED_OUTPATIENT_CLINIC_OR_DEPARTMENT_OTHER)
Admission: RE | Admit: 2020-05-21 | Discharge: 2020-05-21 | Disposition: A | Discharge status: Home / Self Care | Payer: Medicare Other | Source: Ambulatory Visit | Attending: Hematology & Oncology | Admitting: Hematology & Oncology

## 2020-05-21 DIAGNOSIS — C7B8 Other secondary neuroendocrine tumors: Secondary | ICD-10-CM | POA: Insufficient documentation

## 2020-05-21 DIAGNOSIS — C7951 Secondary malignant neoplasm of bone: Secondary | ICD-10-CM | POA: Diagnosis not present

## 2020-05-21 DIAGNOSIS — C7800 Secondary malignant neoplasm of unspecified lung: Secondary | ICD-10-CM | POA: Diagnosis not present

## 2020-05-21 DIAGNOSIS — J1282 Pneumonia due to coronavirus disease 2019: Secondary | ICD-10-CM | POA: Diagnosis not present

## 2020-05-21 DIAGNOSIS — Z86012 Personal history of benign carcinoid tumor: Secondary | ICD-10-CM | POA: Diagnosis not present

## 2020-05-21 DIAGNOSIS — C7A8 Other malignant neuroendocrine tumors: Secondary | ICD-10-CM | POA: Diagnosis not present

## 2020-05-21 DIAGNOSIS — R000 Tachycardia, unspecified: Secondary | ICD-10-CM | POA: Diagnosis not present

## 2020-05-21 DIAGNOSIS — D849 Immunodeficiency, unspecified: Secondary | ICD-10-CM | POA: Diagnosis not present

## 2020-05-21 DIAGNOSIS — U071 COVID-19: Secondary | ICD-10-CM | POA: Diagnosis not present

## 2020-05-21 DIAGNOSIS — R059 Cough, unspecified: Secondary | ICD-10-CM | POA: Diagnosis not present

## 2020-05-21 DIAGNOSIS — K6289 Other specified diseases of anus and rectum: Secondary | ICD-10-CM | POA: Diagnosis not present

## 2020-05-21 DIAGNOSIS — C414 Malignant neoplasm of pelvic bones, sacrum and coccyx: Secondary | ICD-10-CM | POA: Diagnosis not present

## 2020-05-21 DIAGNOSIS — A419 Sepsis, unspecified organism: Secondary | ICD-10-CM | POA: Diagnosis not present

## 2020-05-21 DIAGNOSIS — R509 Fever, unspecified: Secondary | ICD-10-CM | POA: Diagnosis not present

## 2020-05-21 DIAGNOSIS — C7A1 Malignant poorly differentiated neuroendocrine tumors: Secondary | ICD-10-CM | POA: Diagnosis not present

## 2020-05-21 DIAGNOSIS — J189 Pneumonia, unspecified organism: Secondary | ICD-10-CM | POA: Diagnosis not present

## 2020-05-21 MED ORDER — GADOBUTROL 1 MMOL/ML IV SOLN
7.5000 mL | Freq: Once | INTRAVENOUS | Status: AC | PRN
  Administered 2020-05-21: 7.5 mL via INTRAVENOUS

## 2020-05-23 ENCOUNTER — Inpatient Hospital Stay (HOSPITAL_BASED_OUTPATIENT_CLINIC_OR_DEPARTMENT_OTHER)
Admission: EM | Admit: 2020-05-23 | Discharge: 2020-05-26 | DRG: 177 | Disposition: A | Discharge status: Home / Self Care | Payer: Medicare Other | Attending: Internal Medicine | Admitting: Internal Medicine

## 2020-05-23 ENCOUNTER — Encounter: Payer: Self-pay | Admitting: Radiation Oncology

## 2020-05-23 ENCOUNTER — Encounter (HOSPITAL_BASED_OUTPATIENT_CLINIC_OR_DEPARTMENT_OTHER): Payer: Self-pay | Admitting: *Deleted

## 2020-05-23 ENCOUNTER — Other Ambulatory Visit: Payer: Self-pay

## 2020-05-23 ENCOUNTER — Emergency Department (HOSPITAL_BASED_OUTPATIENT_CLINIC_OR_DEPARTMENT_OTHER): Payer: Medicare Other

## 2020-05-23 DIAGNOSIS — Z8249 Family history of ischemic heart disease and other diseases of the circulatory system: Secondary | ICD-10-CM

## 2020-05-23 DIAGNOSIS — E119 Type 2 diabetes mellitus without complications: Secondary | ICD-10-CM

## 2020-05-23 DIAGNOSIS — N1831 Chronic kidney disease, stage 3a: Secondary | ICD-10-CM | POA: Diagnosis present

## 2020-05-23 DIAGNOSIS — J189 Pneumonia, unspecified organism: Secondary | ICD-10-CM

## 2020-05-23 DIAGNOSIS — U071 COVID-19: Principal | ICD-10-CM | POA: Diagnosis present

## 2020-05-23 DIAGNOSIS — A4189 Other specified sepsis: Principal | ICD-10-CM

## 2020-05-23 DIAGNOSIS — M9904 Segmental and somatic dysfunction of sacral region: Secondary | ICD-10-CM | POA: Diagnosis not present

## 2020-05-23 DIAGNOSIS — R7989 Other specified abnormal findings of blood chemistry: Secondary | ICD-10-CM | POA: Diagnosis present

## 2020-05-23 DIAGNOSIS — I10 Essential (primary) hypertension: Secondary | ICD-10-CM | POA: Diagnosis present

## 2020-05-23 DIAGNOSIS — D539 Nutritional anemia, unspecified: Secondary | ICD-10-CM | POA: Diagnosis present

## 2020-05-23 DIAGNOSIS — Z6823 Body mass index (BMI) 23.0-23.9, adult: Secondary | ICD-10-CM

## 2020-05-23 DIAGNOSIS — J1282 Pneumonia due to coronavirus disease 2019: Secondary | ICD-10-CM | POA: Diagnosis present

## 2020-05-23 DIAGNOSIS — R059 Cough, unspecified: Secondary | ICD-10-CM | POA: Diagnosis not present

## 2020-05-23 DIAGNOSIS — Z923 Personal history of irradiation: Secondary | ICD-10-CM

## 2020-05-23 DIAGNOSIS — N183 Chronic kidney disease, stage 3 unspecified: Secondary | ICD-10-CM

## 2020-05-23 DIAGNOSIS — Z8042 Family history of malignant neoplasm of prostate: Secondary | ICD-10-CM

## 2020-05-23 DIAGNOSIS — Z79899 Other long term (current) drug therapy: Secondary | ICD-10-CM

## 2020-05-23 DIAGNOSIS — M9902 Segmental and somatic dysfunction of thoracic region: Secondary | ICD-10-CM | POA: Diagnosis not present

## 2020-05-23 DIAGNOSIS — I129 Hypertensive chronic kidney disease with stage 1 through stage 4 chronic kidney disease, or unspecified chronic kidney disease: Secondary | ICD-10-CM | POA: Diagnosis present

## 2020-05-23 DIAGNOSIS — R627 Adult failure to thrive: Secondary | ICD-10-CM | POA: Diagnosis present

## 2020-05-23 DIAGNOSIS — R000 Tachycardia, unspecified: Secondary | ICD-10-CM | POA: Diagnosis not present

## 2020-05-23 DIAGNOSIS — R5081 Fever presenting with conditions classified elsewhere: Secondary | ICD-10-CM | POA: Diagnosis present

## 2020-05-23 DIAGNOSIS — M9903 Segmental and somatic dysfunction of lumbar region: Secondary | ICD-10-CM | POA: Diagnosis not present

## 2020-05-23 DIAGNOSIS — A419 Sepsis, unspecified organism: Secondary | ICD-10-CM | POA: Diagnosis not present

## 2020-05-23 DIAGNOSIS — Z9221 Personal history of antineoplastic chemotherapy: Secondary | ICD-10-CM

## 2020-05-23 DIAGNOSIS — M9901 Segmental and somatic dysfunction of cervical region: Secondary | ICD-10-CM | POA: Diagnosis not present

## 2020-05-23 DIAGNOSIS — N179 Acute kidney failure, unspecified: Secondary | ICD-10-CM | POA: Diagnosis present

## 2020-05-23 DIAGNOSIS — C7951 Secondary malignant neoplasm of bone: Secondary | ICD-10-CM | POA: Diagnosis present

## 2020-05-23 DIAGNOSIS — E1122 Type 2 diabetes mellitus with diabetic chronic kidney disease: Secondary | ICD-10-CM | POA: Diagnosis present

## 2020-05-23 DIAGNOSIS — C7800 Secondary malignant neoplasm of unspecified lung: Secondary | ICD-10-CM | POA: Diagnosis present

## 2020-05-23 DIAGNOSIS — E785 Hyperlipidemia, unspecified: Secondary | ICD-10-CM | POA: Diagnosis present

## 2020-05-23 DIAGNOSIS — E860 Dehydration: Secondary | ICD-10-CM | POA: Diagnosis present

## 2020-05-23 DIAGNOSIS — D709 Neutropenia, unspecified: Secondary | ICD-10-CM | POA: Diagnosis present

## 2020-05-23 DIAGNOSIS — C7A8 Other malignant neuroendocrine tumors: Secondary | ICD-10-CM | POA: Diagnosis present

## 2020-05-23 DIAGNOSIS — D849 Immunodeficiency, unspecified: Secondary | ICD-10-CM | POA: Diagnosis present

## 2020-05-23 DIAGNOSIS — C7B8 Other secondary neuroendocrine tumors: Secondary | ICD-10-CM | POA: Diagnosis present

## 2020-05-23 DIAGNOSIS — Z28310 Unvaccinated for covid-19: Secondary | ICD-10-CM

## 2020-05-23 DIAGNOSIS — R509 Fever, unspecified: Secondary | ICD-10-CM | POA: Diagnosis not present

## 2020-05-23 DIAGNOSIS — Z885 Allergy status to narcotic agent status: Secondary | ICD-10-CM

## 2020-05-23 DIAGNOSIS — Z8546 Personal history of malignant neoplasm of prostate: Secondary | ICD-10-CM

## 2020-05-23 DIAGNOSIS — Z7982 Long term (current) use of aspirin: Secondary | ICD-10-CM

## 2020-05-23 LAB — LACTATE DEHYDROGENASE: LDH: 131 U/L (ref 98–192)

## 2020-05-23 LAB — COMPREHENSIVE METABOLIC PANEL
ALT: 19 U/L (ref 0–44)
AST: 20 U/L (ref 15–41)
Albumin: 3.6 g/dL (ref 3.5–5.0)
Alkaline Phosphatase: 50 U/L (ref 38–126)
Anion gap: 10 (ref 5–15)
BUN: 19 mg/dL (ref 8–23)
CO2: 25 mmol/L (ref 22–32)
Calcium: 8.9 mg/dL (ref 8.9–10.3)
Chloride: 99 mmol/L (ref 98–111)
Creatinine, Ser: 1.47 mg/dL — ABNORMAL HIGH (ref 0.61–1.24)
GFR, Estimated: 50 mL/min — ABNORMAL LOW (ref >60)
Glucose, Bld: 118 mg/dL — ABNORMAL HIGH (ref 70–99)
Potassium: 3.8 mmol/L (ref 3.5–5.1)
Sodium: 134 mmol/L — ABNORMAL LOW (ref 135–145)
Total Bilirubin: 0.4 mg/dL (ref 0.3–1.2)
Total Protein: 6.8 g/dL (ref 6.5–8.1)

## 2020-05-23 LAB — CBC WITH DIFFERENTIAL/PLATELET
Abs Immature Granulocytes: 0.04 10*3/uL (ref 0.00–0.07)
Basophils Absolute: 0 10*3/uL (ref 0.0–0.1)
Basophils Relative: 0 %
Eosinophils Absolute: 0 10*3/uL (ref 0.0–0.5)
Eosinophils Relative: 0 %
HCT: 29.5 % — ABNORMAL LOW (ref 39.0–52.0)
Hemoglobin: 9.6 g/dL — ABNORMAL LOW (ref 13.0–17.0)
Immature Granulocytes: 2 %
Lymphocytes Relative: 18 %
Lymphs Abs: 0.4 10*3/uL — ABNORMAL LOW (ref 0.7–4.0)
MCH: 33.6 pg (ref 26.0–34.0)
MCHC: 32.5 g/dL (ref 30.0–36.0)
MCV: 103.1 fL — ABNORMAL HIGH (ref 80.0–100.0)
Monocytes Absolute: 0.8 10*3/uL (ref 0.1–1.0)
Monocytes Relative: 32 %
Neutro Abs: 1.1 10*3/uL — ABNORMAL LOW (ref 1.7–7.7)
Neutrophils Relative %: 48 %
Platelets: 342 10*3/uL (ref 150–400)
RBC: 2.86 MIL/uL — ABNORMAL LOW (ref 4.22–5.81)
RDW: 16 % — ABNORMAL HIGH (ref 11.5–15.5)
WBC: 2.4 10*3/uL — ABNORMAL LOW (ref 4.0–10.5)
nRBC: 1.3 % — ABNORMAL HIGH (ref 0.0–0.2)

## 2020-05-23 LAB — URINALYSIS, ROUTINE W REFLEX MICROSCOPIC
Bilirubin Urine: NEGATIVE
Glucose, UA: NEGATIVE mg/dL
Hgb urine dipstick: NEGATIVE
Ketones, ur: NEGATIVE mg/dL
Leukocytes,Ua: NEGATIVE
Nitrite: NEGATIVE
Protein, ur: NEGATIVE mg/dL
Specific Gravity, Urine: <1.005 — ABNORMAL LOW (ref 1.005–1.030)
pH: 6 (ref 5.0–8.0)

## 2020-05-23 LAB — TRIGLYCERIDES: Triglycerides: 78 mg/dL

## 2020-05-23 LAB — FERRITIN: Ferritin: 459 ng/mL — ABNORMAL HIGH (ref 24–336)

## 2020-05-23 LAB — LACTIC ACID, PLASMA: Lactic Acid, Venous: 1.2 mmol/L (ref 0.5–1.9)

## 2020-05-23 LAB — C-REACTIVE PROTEIN: CRP: 1.3 mg/dL — ABNORMAL HIGH

## 2020-05-23 LAB — RESP PANEL BY RT-PCR (FLU A&B, COVID) ARPGX2
Influenza A by PCR: NEGATIVE
Influenza B by PCR: NEGATIVE
SARS Coronavirus 2 by RT PCR: POSITIVE — AB

## 2020-05-23 LAB — PROCALCITONIN: Procalcitonin: 0.45 ng/mL

## 2020-05-23 LAB — D-DIMER, QUANTITATIVE: D-Dimer, Quant: 2.04 ug/mL-FEU — ABNORMAL HIGH (ref 0.00–0.50)

## 2020-05-23 MED ORDER — LACTATED RINGERS IV BOLUS
1000.0000 mL | Freq: Once | INTRAVENOUS | Status: AC
  Administered 2020-05-23: 1000 mL via INTRAVENOUS

## 2020-05-23 MED ORDER — SODIUM CHLORIDE 0.9 % IV SOLN: INTRAVENOUS | Status: DC | PRN

## 2020-05-23 MED ORDER — ACETAMINOPHEN 325 MG PO TABS
650.0000 mg | ORAL_TABLET | Freq: Once | ORAL | Status: AC
  Administered 2020-05-23: 650 mg via ORAL
  Filled 2020-05-23: qty 2

## 2020-05-23 MED ORDER — SODIUM CHLORIDE 0.9 % IV SOLN
500.0000 mg | Freq: Once | INTRAVENOUS | Status: AC
  Administered 2020-05-23: 500 mg via INTRAVENOUS
  Filled 2020-05-23: qty 500

## 2020-05-23 MED ORDER — SODIUM CHLORIDE 0.9 % IV SOLN
1.0000 g | Freq: Once | INTRAVENOUS | Status: AC
  Administered 2020-05-23: 1 g via INTRAVENOUS
  Filled 2020-05-23: qty 10

## 2020-05-23 NOTE — ED Notes (Signed)
ED Provider at bedside. 

## 2020-05-23 NOTE — ED Triage Notes (Signed)
C/o cough, fever x 3 days

## 2020-05-23 NOTE — ED Provider Notes (Signed)
[facility] EMERGENCY DEPARTMENT Provider Note   CSN: [id number] Arrival date & time: 05/23/20  1752     History Chief Complaint  Patient presents with  . Cough    [name] is a 75 y.o. male.  HPI Patient is a 75 year old male with a history of diabetes mellitus, hypertension, metastatic cancer on chemotherapy, who presents to the emergency department due to cough.  Patient states about 2 to 3 days ago he started experiencing a dry cough.  He came to the emergency department for evaluation of his cough.  Denies any other symptoms.  No sore throat, congestion, chest pain, shortness of breath, nausea, vomiting, diarrhea, abdominal pain, urinary complaints.  He has a history of metastatic cancer and states that he is on chemotherapy every 3 weeks.  His last chemotherapy session was about 2 weeks ago.  He is not vaccinated for COVID-19.     Past Medical History:  Diagnosis Date  . 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 Complication of anesthesia   . Constipation    due to pain medication  . 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 Dyslipidemia 07/12/2014   Overview:  Intolerant to multiple statins  Formatting of this note might be different from the original. Intolerant to multiple statins  . Dyspnea    with exertion  . Essential hypertension 07/12/2014  . Goals of care, counseling/discussion 01/29/2018  . High blood sugar   . History of radiation therapy 04/13/20-04/27/20   IMRT- Pelvis - Dr. [name]  . Iron deficiency anemia due to chronic blood loss 02/04/2019  . Ischemic chest pain ([place]) 07/12/2014   Formatting of this note might be different from the original. Minimally abnormal stress test but does not want to do cardiac catheterization, asymptomatic.  . 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 Pain of right posterior thigh 02/15/2014  . PONV (postoperative nausea  and vomiting)   . 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 Radiation 04/22/14-05/06/14   right sacrum 30 gray  . 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 Type 2 diabetes mellitus ([place]) 03/23/2018    Patient Active Problem List   Diagnosis Date Noted  . PONV (postoperative nausea and vomiting)   . High blood sugar   . Dyspnea   . Diabetes mellitus without complication ([place])   . Constipation   . Complication of anesthesia   . Iron deficiency anemia due to chronic blood loss 02/04/2019  . Type 2 diabetes mellitus ([place]) 03/23/2018  . Goals of care, counseling/discussion 01/29/2018  . 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 Metastasis to bone ([place]) 09/12/2016  . 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 Dyslipidemia 07/12/2014  . Essential hypertension 07/12/2014  . Ischemic chest pain ([place]) 07/12/2014  . Pain of right posterior thigh 02/15/2014  . Prostate cancer ([place]) 07/23/2011    Past Surgical History:  Procedure Laterality Date  . CIRCUMCISION     when patient was 75 years old  . COLONOSCOPY    . COLONOSCOPY W/ POLYPECTOMY    . ESOPHAGOGASTRODUODENOSCOPY    . HAND SURGERY Left    fracture  . IR IMAGING GUIDED PORT INSERTION  02/10/2018  .  LUMBAR LAMINECTOMY/DECOMPRESSION MICRODISCECTOMY Right 09/12/2016   Procedure: LAMINECTOMY AND FORAMINOTOMY RIGHT LUMBAR FIVE- SACRAL ONE, SACRAL ONE- SACRAL TWO DECOMPRESSION OF RIGHT SACRAL ONE NERVE ROOT;  Surgeon: [name], MD;  Location: [facility];  Service: Neurosurgery;  Laterality: Right;  . ROBOT ASSISTED LAPAROSCOPIC RADICAL PROSTATECTOMY    . TONSILECTOMY/ADENOIDECTOMY WITH MYRINGOTOMY         Family History  Problem Relation Age of Onset  . Prostate  cancer Father   . Heart disease Father   . Heart disease Mother     Social History   Tobacco Use  . Smoking status: Never Smoker  . Smokeless tobacco: Never Used  . Tobacco comment: never used tobacco  Vaping Use  . Vaping Use: Never used  Substance Use Topics  . Alcohol use: No    Alcohol/week: 0.0 standard drinks  . Drug use: No    Home Medications Prior to Admission medications   Medication Sig Start Date End Date Taking? Authorizing Provider  aspirin 81 MG tablet Take 81 mg by mouth daily.    [provider]  Calcium Carbonate-Vitamin D (OYSTER SHELL CALCIUM/D) 250-125 MG-UNIT TABS Take by mouth daily.     [provider]  CALCIUM-IRON-VIT D-VIT K PO Take 1 tablet by mouth 2 (two) times daily.    [provider]  dexamethasone (DECADRON) 4 MG tablet TAKE 5 TABLETS BY MOUTH DAILY **TAKE WITH FOOD** Patient taking differently: 12 mg daily. 03/21/20 03/21/21  [name], MD  ezetimibe (ZETIA) 10 MG tablet TAKE 1 TABLET BY MOUTH DAILY 12/01/19 11/30/20  Nicoletta Dress, MD  gabapentin (NEURONTIN) 300 MG capsule TAKE 1 CAPSULE BY MOUTH TWICE A DAY THEN TAKE 2 CAPSULES AT BEDTIME Patient taking differently: 04/20/2020 One in am and two at hs. 04/20/20 04/20/21  Ennever, Rudell Cobb, MD  [facility] 10 GM/15ML SOLN daily as needed. 05/07/19   [provider]  isosorbide mononitrate (IMDUR) 30 MG 24 hr tablet TAKE 1 TABLET (30 MG TOTAL) BY MOUTH DAILY. 03/30/20 03/30/21  Park Liter, MD  lidocaine-prilocaine (EMLA) cream  12/17/18   [provider]  OVER THE COUNTER MEDICATION every morning. Juice Plus -- 8 capsule of each the garden, vineyard, and orchard twice a day.    [provider]  pantoprazole (PROTONIX) 40 MG tablet TAKE 1 TABLET (40 MG TOTAL) BY MOUTH AT BEDTIME. 03/03/20 03/03/21  [name], MD  prochlorperazine (COMPAZINE) 10 MG tablet TAKE 1 TABLET (10 MG TOTAL) BY MOUTH EVERY 6 (SIX) HOURS AS NEEDED (NAUSEA OR  VOMITING). 04/29/19   [name], MD  senna-docusate (SENOKOT-S) 8.6-50 MG tablet Take 2-4 tablets by mouth at bedtime.    [provider]  ZETIA 10 MG tablet Take 10 mg by mouth daily.  06/29/14   [provider]  Zinc 50 MG CAPS Take 50 mg by mouth daily.    [provider]    Allergies    Codeine and Hydrocodone  Review of Systems   Review of Systems  All other systems reviewed and are negative. Ten systems reviewed and are negative for acute change, except as noted in the HPI.   Physical Exam Updated Vital Signs BP 121/84   Pulse 100   Temp 98.9 F (37.2 C) (Oral)   Resp (!) 22   Ht 5\' 8"  (1.727 m)   Wt 70.3 kg   SpO2 97%   BMI 23.57 kg/m   Physical Exam Vitals and nursing note reviewed.  Constitutional:  General: He is not in acute distress.    Appearance: Normal appearance. He is normal weight. He is not ill-appearing, toxic-appearing or diaphoretic.  HENT:     Head: Normocephalic and atraumatic.     Right Ear: External ear normal.     Left Ear: External ear normal.     Nose: Nose normal.     Mouth/Throat:     Mouth: Mucous membranes are moist.     Pharynx: Oropharynx is clear. No oropharyngeal exudate or posterior oropharyngeal erythema.  Eyes:     General: No scleral icterus.       Right eye: No discharge.        Left eye: No discharge.     Extraocular Movements: Extraocular movements intact.     Conjunctiva/sclera: Conjunctivae normal.  Cardiovascular:     Rate and Rhythm: Regular rhythm. Tachycardia present.     Pulses: Normal pulses.     Heart sounds: Normal heart sounds. No murmur heard. No friction rub. No gallop.   Pulmonary:     Effort: Pulmonary effort is normal. No respiratory distress.     Breath sounds: Normal breath sounds. No stridor. No wheezing, rhonchi or rales.     Comments: Lungs are clear to auscultation bilaterally. Abdominal:     General: Abdomen is flat.     Palpations: Abdomen is soft.      Tenderness: There is no abdominal tenderness.     Comments: Abdomen is soft and nontender in all 4 quadrants.  Musculoskeletal:        General: Normal range of motion.     Cervical back: Normal range of motion and neck supple. No tenderness.  Skin:    General: Skin is warm and dry.  Neurological:     General: No focal deficit present.     Mental Status: He is alert and oriented to person, place, and time.  Psychiatric:        Mood and Affect: Mood normal.        Behavior: Behavior normal.     ED Results / Procedures / Treatments   Labs (all labs ordered are listed, but only abnormal results are displayed) Labs Reviewed  COMPREHENSIVE METABOLIC PANEL - Abnormal; Notable for the following components:      Result Value   Sodium 134 (*)    Glucose, Bld 118 (*)    Creatinine, Ser 1.47 (*)    GFR, Estimated 50 (*)    All other components within normal limits  CBC WITH DIFFERENTIAL/PLATELET - Abnormal; Notable for the following components:   WBC 2.4 (*)    RBC 2.86 (*)    Hemoglobin 9.6 (*)    HCT 29.5 (*)    MCV 103.1 (*)    RDW 16.0 (*)    nRBC 1.3 (*)    Neutro Abs 1.1 (*)    Lymphs Abs 0.4 (*)    All other components within normal limits  CULTURE, BLOOD (ROUTINE X 2)  CULTURE, BLOOD (ROUTINE X 2)  RESP PANEL BY RT-PCR (FLU A&B, COVID) ARPGX2  LACTIC ACID, PLASMA  URINALYSIS, ROUTINE W REFLEX MICROSCOPIC  D-DIMER, QUANTITATIVE  PROCALCITONIN  LACTATE DEHYDROGENASE  FERRITIN  TRIGLYCERIDES  FIBRINOGEN  C-REACTIVE PROTEIN    EKG None  Radiology DG Chest Portable 1 View  Result Date: 05/23/2020 CLINICAL DATA:  Cough and fever for several days EXAM: PORTABLE CHEST 1 VIEW COMPARISON:  03/23/2018 FINDINGS: Cardiac shadow is stable. Right chest wall port is again seen and stable. Lungs are well aerated  bilaterally. Patchy left basilar airspace opacity is noted which may represent early infiltrate. No bony abnormality is noted. IMPRESSION: Findings suspicious for early  infiltrate in the left base. Electronically Signed   By: Inez Catalina M.D.   On: 05/23/2020 18:38    Procedures .Critical Care Performed by: Rayna Sexton, PA-C Authorized by: Rayna Sexton, PA-C   Critical care provider statement:    Critical care time (minutes):  45   Critical care was necessary to treat or prevent imminent or life-threatening deterioration of the following conditions:  Sepsis   Critical care was time spent personally by me on the following activities:  Discussions with consultants, evaluation of patient's response to treatment, examination of patient, ordering and performing treatments and interventions, ordering and review of laboratory studies, ordering and review of radiographic studies, pulse oximetry, re-evaluation of patient's condition, obtaining history from patient or surrogate and review of old charts    Medications Ordered in ED Medications  0.9 %  sodium chloride infusion ( Intravenous New Bag/Given 05/23/20 1956)  acetaminophen (TYLENOL) tablet 650 mg (650 mg Oral Given 05/23/20 1815)  lactated ringers bolus 1,000 mL (0 mLs Intravenous Stopped 05/23/20 2030)  cefTRIAXone (ROCEPHIN) 1 g in sodium chloride 0.9 % 100 mL IVPB (0 g Intravenous Stopped 05/23/20 1952)  azithromycin (ZITHROMAX) 500 mg in sodium chloride 0.9 % 250 mL IVPB (500 mg Intravenous New Bag/Given 05/23/20 1957)  lactated ringers bolus 1,000 mL (1,000 mLs Intravenous New Bag/Given 05/23/20 2034)    ED Course  I have reviewed the triage vital signs and the nursing notes.  Pertinent labs & imaging results that were available during my care of the patient were reviewed by me and considered in my medical decision making (see chart for details).    MDM Rules/Calculators/A&P                          Pt is a 75 y.o. male who presents the emergency department with sepsis secondary to COVID-19.  He is currently being treated for cancer with chemotherapy.  Last chemotherapy session was about 2 weeks  ago.  Labs: CBC with a white count of 2.4, hemoglobin 9.6, MCV of 103.1, neutrophils of 1.1, lymphocytes of 0.4. CMP with a sodium of 134, glucose of 118, creatinine of 1.47, GFR 50. Lactic acid 1.2. Blood cultures pending.  Imaging: Chest x-ray concerning for early infiltrate in the left base.  I, Rayna Sexton, PA-C, personally reviewed and evaluated these images and lab results as part of my medical decision-making.  Patient initially tachycardic at 112 with a fever of 103.3 F.  Appears septic.  Came to the emergency department for a dry cough but otherwise had no further complaints.  Patient fluid bolused, given antipyretics, and started on antibiotics for pneumonia.  Given patient's age, immunocompromise status, as well as his medical history, feel that admission is warranted.  Will discuss with the medicine team.  Note: Portions of this report may have been transcribed using voice recognition software. Every effort was made to ensure accuracy; however, inadvertent computerized transcription errors may be present.   Final Clinical Impression(s) / ED Diagnoses Final diagnoses:  Sepsis due to COVID-19 [facility])   Rx / [facility] Orders ED Discharge Orders    None       Moody Bruins 05/23/20 2104    Luna Fuse, MD 05/23/20 2122

## 2020-05-24 ENCOUNTER — Telehealth: Payer: Self-pay | Admitting: *Deleted

## 2020-05-24 ENCOUNTER — Encounter (HOSPITAL_COMMUNITY): Payer: Self-pay | Admitting: Family Medicine

## 2020-05-24 ENCOUNTER — Inpatient Hospital Stay (HOSPITAL_COMMUNITY): Payer: Medicare Other

## 2020-05-24 DIAGNOSIS — Z923 Personal history of irradiation: Secondary | ICD-10-CM | POA: Diagnosis not present

## 2020-05-24 DIAGNOSIS — C7A8 Other malignant neuroendocrine tumors: Secondary | ICD-10-CM | POA: Diagnosis present

## 2020-05-24 DIAGNOSIS — E1122 Type 2 diabetes mellitus with diabetic chronic kidney disease: Secondary | ICD-10-CM | POA: Diagnosis present

## 2020-05-24 DIAGNOSIS — C7951 Secondary malignant neoplasm of bone: Secondary | ICD-10-CM | POA: Diagnosis present

## 2020-05-24 DIAGNOSIS — N1831 Chronic kidney disease, stage 3a: Secondary | ICD-10-CM | POA: Diagnosis present

## 2020-05-24 DIAGNOSIS — D849 Immunodeficiency, unspecified: Secondary | ICD-10-CM | POA: Diagnosis present

## 2020-05-24 DIAGNOSIS — J1282 Pneumonia due to coronavirus disease 2019: Secondary | ICD-10-CM

## 2020-05-24 DIAGNOSIS — R509 Fever, unspecified: Secondary | ICD-10-CM | POA: Diagnosis not present

## 2020-05-24 DIAGNOSIS — E785 Hyperlipidemia, unspecified: Secondary | ICD-10-CM | POA: Diagnosis present

## 2020-05-24 DIAGNOSIS — N183 Chronic kidney disease, stage 3 unspecified: Secondary | ICD-10-CM

## 2020-05-24 DIAGNOSIS — R059 Cough, unspecified: Secondary | ICD-10-CM | POA: Diagnosis not present

## 2020-05-24 DIAGNOSIS — Z6823 Body mass index (BMI) 23.0-23.9, adult: Secondary | ICD-10-CM | POA: Diagnosis not present

## 2020-05-24 DIAGNOSIS — D539 Nutritional anemia, unspecified: Secondary | ICD-10-CM | POA: Diagnosis present

## 2020-05-24 DIAGNOSIS — C7800 Secondary malignant neoplasm of unspecified lung: Secondary | ICD-10-CM | POA: Diagnosis present

## 2020-05-24 DIAGNOSIS — Z8042 Family history of malignant neoplasm of prostate: Secondary | ICD-10-CM | POA: Diagnosis not present

## 2020-05-24 DIAGNOSIS — R5081 Fever presenting with conditions classified elsewhere: Secondary | ICD-10-CM | POA: Diagnosis present

## 2020-05-24 DIAGNOSIS — N179 Acute kidney failure, unspecified: Secondary | ICD-10-CM | POA: Diagnosis present

## 2020-05-24 DIAGNOSIS — U071 COVID-19: Secondary | ICD-10-CM | POA: Diagnosis present

## 2020-05-24 DIAGNOSIS — D709 Neutropenia, unspecified: Secondary | ICD-10-CM

## 2020-05-24 DIAGNOSIS — Z8546 Personal history of malignant neoplasm of prostate: Secondary | ICD-10-CM | POA: Diagnosis not present

## 2020-05-24 DIAGNOSIS — J189 Pneumonia, unspecified organism: Secondary | ICD-10-CM

## 2020-05-24 DIAGNOSIS — Z885 Allergy status to narcotic agent status: Secondary | ICD-10-CM | POA: Diagnosis not present

## 2020-05-24 DIAGNOSIS — Z8249 Family history of ischemic heart disease and other diseases of the circulatory system: Secondary | ICD-10-CM | POA: Diagnosis not present

## 2020-05-24 DIAGNOSIS — Z79899 Other long term (current) drug therapy: Secondary | ICD-10-CM | POA: Diagnosis not present

## 2020-05-24 DIAGNOSIS — R627 Adult failure to thrive: Secondary | ICD-10-CM | POA: Diagnosis present

## 2020-05-24 DIAGNOSIS — I129 Hypertensive chronic kidney disease with stage 1 through stage 4 chronic kidney disease, or unspecified chronic kidney disease: Secondary | ICD-10-CM | POA: Diagnosis present

## 2020-05-24 DIAGNOSIS — Z28310 Unvaccinated for covid-19: Secondary | ICD-10-CM | POA: Diagnosis not present

## 2020-05-24 DIAGNOSIS — E860 Dehydration: Secondary | ICD-10-CM | POA: Diagnosis present

## 2020-05-24 LAB — ABO/RH
ABO/RH(D): O POS
ABO/RH(D): O POS

## 2020-05-24 LAB — BPAM FFP
Blood Product Expiration Date: 202205110900
Unit Type and Rh: 5100

## 2020-05-24 LAB — CBC
HCT: 26.5 % — ABNORMAL LOW (ref 39.0–52.0)
Hemoglobin: 8.6 g/dL — ABNORMAL LOW (ref 13.0–17.0)
MCH: 33.9 pg (ref 26.0–34.0)
MCHC: 32.5 g/dL (ref 30.0–36.0)
MCV: 104.3 fL — ABNORMAL HIGH (ref 80.0–100.0)
Platelets: 297 10*3/uL (ref 150–400)
RBC: 2.54 MIL/uL — ABNORMAL LOW (ref 4.22–5.81)
RDW: 16.2 % — ABNORMAL HIGH (ref 11.5–15.5)
WBC: 2 10*3/uL — ABNORMAL LOW (ref 4.0–10.5)
nRBC: 0 % (ref 0.0–0.2)

## 2020-05-24 LAB — PREPARE FRESH FROZEN PLASMA

## 2020-05-24 LAB — CREATININE, SERUM
Creatinine, Ser: 1.36 mg/dL — ABNORMAL HIGH (ref 0.61–1.24)
GFR, Estimated: 55 mL/min — ABNORMAL LOW (ref >60)

## 2020-05-24 LAB — FIBRINOGEN: Fibrinogen: 479 mg/dL — ABNORMAL HIGH (ref 210–475)

## 2020-05-24 MED ORDER — METHYLPREDNISOLONE SODIUM SUCC 125 MG IJ SOLR: 125.0000 mg | Freq: Once | INTRAMUSCULAR | Status: DC | PRN

## 2020-05-24 MED ORDER — GUAIFENESIN-DM 100-10 MG/5ML PO SYRP: 10.0000 mL | ORAL_SOLUTION | ORAL | Status: DC | PRN

## 2020-05-24 MED ORDER — ZINC SULFATE 220 (50 ZN) MG PO CAPS
220.0000 mg | ORAL_CAPSULE | Freq: Every day | ORAL | Status: DC
  Administered 2020-05-24 – 2020-05-26 (×3): 220 mg via ORAL
  Filled 2020-05-24 (×3): qty 1

## 2020-05-24 MED ORDER — ACETAMINOPHEN 325 MG PO TABS
650.0000 mg | ORAL_TABLET | Freq: Four times a day (QID) | ORAL | Status: DC | PRN
  Administered 2020-05-24: 650 mg via ORAL
  Filled 2020-05-24: qty 2

## 2020-05-24 MED ORDER — EPINEPHRINE 0.3 MG/0.3ML IJ SOAJ
0.3000 mg | Freq: Once | INTRAMUSCULAR | Status: DC | PRN
  Filled 2020-05-24: qty 0.6

## 2020-05-24 MED ORDER — TBO-FILGRASTIM 480 MCG/0.8ML ~~LOC~~ SOSY
480.0000 ug | PREFILLED_SYRINGE | Freq: Every day | SUBCUTANEOUS | Status: DC
  Administered 2020-05-24: 480 ug via SUBCUTANEOUS
  Filled 2020-05-24 (×2): qty 0.8

## 2020-05-24 MED ORDER — ALBUTEROL SULFATE HFA 108 (90 BASE) MCG/ACT IN AERS: 2.0000 | INHALATION_SPRAY | RESPIRATORY_TRACT | Status: DC | PRN

## 2020-05-24 MED ORDER — DIPHENHYDRAMINE HCL 50 MG/ML IJ SOLN
50.0000 mg | Freq: Once | INTRAMUSCULAR | Status: AC | PRN
  Administered 2020-05-24: 50 mg via INTRAVENOUS
  Filled 2020-05-24: qty 1

## 2020-05-24 MED ORDER — SODIUM CHLORIDE 0.9 % IV SOLN: INTRAVENOUS | Status: DC | PRN

## 2020-05-24 MED ORDER — CHLORHEXIDINE GLUCONATE CLOTH 2 % EX PADS
6.0000 | MEDICATED_PAD | Freq: Every day | CUTANEOUS | Status: DC
  Administered 2020-05-24 – 2020-05-26 (×3): 6 via TOPICAL

## 2020-05-24 MED ORDER — FAMOTIDINE IN NACL 20-0.9 MG/50ML-% IV SOLN
20.0000 mg | Freq: Once | INTRAVENOUS | Status: DC | PRN
  Filled 2020-05-24: qty 50

## 2020-05-24 MED ORDER — ENOXAPARIN SODIUM 40 MG/0.4ML IJ SOSY
40.0000 mg | PREFILLED_SYRINGE | INTRAMUSCULAR | Status: DC
  Administered 2020-05-24 – 2020-05-26 (×3): 40 mg via SUBCUTANEOUS
  Filled 2020-05-24 (×3): qty 0.4

## 2020-05-24 MED ORDER — ONDANSETRON HCL 4 MG/2ML IJ SOLN
4.0000 mg | Freq: Four times a day (QID) | INTRAMUSCULAR | Status: DC | PRN
  Filled 2020-05-24: qty 2

## 2020-05-24 MED ORDER — BEBTELOVIMAB 175 MG/2 ML IV (EUA)
175.0000 mg | Freq: Once | INTRAMUSCULAR | Status: AC
  Administered 2020-05-24: 175 mg via INTRAVENOUS
  Filled 2020-05-24: qty 2

## 2020-05-24 MED ORDER — SODIUM CHLORIDE 0.9 % IV SOLN
1.0000 g | INTRAVENOUS | Status: DC
  Administered 2020-05-24 – 2020-05-25 (×2): 1 g via INTRAVENOUS
  Filled 2020-05-24 (×4): qty 10

## 2020-05-24 MED ORDER — ACETAMINOPHEN 500 MG PO TABS
500.0000 mg | ORAL_TABLET | ORAL | Status: DC | PRN
Start: 2020-05-24 — End: 2020-05-26
  Administered 2020-05-24 – 2020-05-25 (×2): 500 mg via ORAL
  Filled 2020-05-24 (×2): qty 1

## 2020-05-24 MED ORDER — IOHEXOL 350 MG/ML SOLN
100.0000 mL | Freq: Once | INTRAVENOUS | Status: AC | PRN
  Administered 2020-05-24: 80 mL via INTRAVENOUS

## 2020-05-24 MED ORDER — SENNOSIDES-DOCUSATE SODIUM 8.6-50 MG PO TABS: 1.0000 | ORAL_TABLET | Freq: Every evening | ORAL | Status: DC | PRN

## 2020-05-24 MED ORDER — SODIUM CHLORIDE 0.9% IV SOLUTION: Freq: Once | INTRAVENOUS | Status: AC

## 2020-05-24 MED ORDER — LACTATED RINGERS IV SOLN: INTRAVENOUS | Status: DC

## 2020-05-24 MED ORDER — EZETIMIBE 10 MG PO TABS
10.0000 mg | ORAL_TABLET | Freq: Every day | ORAL | Status: DC
  Administered 2020-05-24 – 2020-05-26 (×3): 10 mg via ORAL
  Filled 2020-05-24 (×3): qty 1

## 2020-05-24 MED ORDER — ASCORBIC ACID 500 MG PO TABS
500.0000 mg | ORAL_TABLET | Freq: Every day | ORAL | Status: DC
  Administered 2020-05-24 – 2020-05-26 (×3): 500 mg via ORAL
  Filled 2020-05-24 (×3): qty 1

## 2020-05-24 MED ORDER — SODIUM CHLORIDE 0.9 % IV SOLN
500.0000 mg | INTRAVENOUS | Status: DC
  Administered 2020-05-24 – 2020-05-25 (×2): 500 mg via INTRAVENOUS
  Filled 2020-05-24 (×4): qty 500

## 2020-05-24 MED ORDER — ASPIRIN EC 81 MG PO TBEC
81.0000 mg | DELAYED_RELEASE_TABLET | Freq: Every day | ORAL | Status: DC
  Administered 2020-05-24 – 2020-05-26 (×3): 81 mg via ORAL
  Filled 2020-05-24 (×3): qty 1

## 2020-05-24 MED ORDER — KETOROLAC TROMETHAMINE 15 MG/ML IJ SOLN
15.0000 mg | Freq: Once | INTRAMUSCULAR | Status: AC
  Administered 2020-05-24: 15 mg via INTRAVENOUS
  Filled 2020-05-24: qty 1

## 2020-05-24 NOTE — Progress Notes (Signed)
[facility] OF CARE NOTE Patient: [name] HAL:[id number]   PCP: Nicoletta Dress, MD DOB: 16-Jan-1946   DOA: 05/23/2020   DOS: 05/24/2020    Patient was admitted by my colleague earlier on 05/24/2020. I have reviewed the H&P as well as assessment and plan and agree with the same. Important changes in the plan are listed below.  Plan of care: Principal Problem:   Pneumonia due to COVID-19 virus Active Problems:   Essential hypertension   Secondary neuroendocrine tumor of bone (HCC)   Type 2 diabetes mellitus ([place])   CKD (chronic kidney disease) stage 3, GFR 30-59 ml/min (HCC)   CAP (community acquired pneumonia)   Neutropenia with fever ([place]) Currently has fever and mild cough but no shortness of breath no dizziness.  Tolerating oral diet. Does not want to be taken remdesivir. Wants to use hydroxychloroquine as well as ivermectin. Recommended patient that this medication not part of FDA's recommendation for COVID-19 and are not part of the official protocol as well here. Patient had orders for convalescent plasma but recommended patient that he would benefit from receiving monoclonal antibodies or newer pill. Patient agreed to monoclonal antibodies and tolerated it well. Remains febrile. Monitor overnight.  On IV ceftriaxone and azithromycin although I do not see that the patient has any evidence of pneumonia. Given that he is febrile and neutropenic currently receiving Granix. Consider giving oral antibiotics for 5 days on discharge.  Author: Berle Mull, MD [facility] 05/24/2020 6:28 PM   If 7PM-7AM, please contact night-coverage at [email or web address]

## 2020-05-24 NOTE — Progress Notes (Signed)
Contacted CT regarding STAT order. CT STATED :: They were very busy but will call me as soon as they have an opening. Made patient aware of the delay and he expressed an understanding

## 2020-05-24 NOTE — Consult Note (Signed)
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 Cough  : Had a temperature and was very tired.  HPI: [name] is a very nice 75 year old white male.  I have known him for many years.  He has a neuroendocrine carcinoma of unknown primary site.  He has been on numerous regimens of therapy.  Currently, he has been treated with lurbinectedin.  He has been on other treatments in the past.  He actually is done nicely.  He has had radiation therapy in addition to chemotherapy.  He last had treatment I think in late April.  His mother on the weekend, fell and broke her femur.  She is over at [facility].  She had surgery for this.  He was over there visiting her on Monday.  He was tired when he got home.  He was sleeping.  He was attended to by his wife.  He was found to have a high temperature.  He subsequently went to the emergency room at [facility].  He was found to be positive for COVID.  He was subsequently admitted.  He had lab work done.  This showed a white count 2.4.  Hemoglobin 9.6.  Platelet count 342,000.  His ANC was 1100.  His sodium was 134.  Potassium 3.8.  BUN 19 creatinine 1.47.  Calcium 8.9.  His albumin was 3.6.  He did have a chest x-ray done.  This showed a patchy left basilar airspace opacity.  A CT scan was subsequently done.  This was a CT angiogram.  Unfortunately there was increase in size of his pulmonary nodules.  There is no pulmonary embolus.  He was subsequently admitted.  He actually looks pretty good.  He is not complain of any obvious pain.  He has not had any hemoptysis.  He has had no rashes.  There has been no diarrhea.  He has had no headache.  Overall, his performance status is ECOG 1.      Past Medical History:  Diagnosis Date  . Cancer, metastatic to bone ([place]) 08/24/2015  . Complication of anesthesia   . Constipation     due to pain medication  . 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 Dyslipidemia 07/12/2014   Overview:  Intolerant to multiple statins  Formatting of this note might be different from the original. Intolerant to multiple statins  . Dyspnea    with exertion  . Essential hypertension 07/12/2014  . Goals of care, counseling/discussion 01/29/2018  . High blood sugar   . History of radiation therapy 04/13/20-04/27/20   IMRT- Pelvis - Dr. [name]  . Iron deficiency anemia due to chronic blood loss 02/04/2019  . Ischemic chest pain ([place]) 07/12/2014   Formatting of this note might be different from the original. Minimally abnormal stress test but does not want to do cardiac catheterization, asymptomatic.  . Metastasis to bone ([place]) 09/12/2016  . Neuroendocrine carcinoma of unknown origin ([place]) 12/14/2016  . Pain of right posterior thigh 02/15/2014  . PONV (postoperative nausea and vomiting)   . 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 Radiation 04/22/14-05/06/14   right sacrum 30 gray  . 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 Type 2 diabetes mellitus ([place]) 03/23/2018  :  Past Surgical History:  Procedure Laterality Date  . CIRCUMCISION     when patient was 75 years old  . COLONOSCOPY    . COLONOSCOPY W/ POLYPECTOMY    . ESOPHAGOGASTRODUODENOSCOPY    . HAND SURGERY Left    fracture  . IR IMAGING GUIDED PORT INSERTION  02/10/2018  . LUMBAR LAMINECTOMY/DECOMPRESSION MICRODISCECTOMY Right 09/12/2016   Procedure: LAMINECTOMY AND FORAMINOTOMY RIGHT LUMBAR FIVE- SACRAL ONE, SACRAL ONE- SACRAL TWO DECOMPRESSION OF RIGHT SACRAL ONE NERVE ROOT;  Surgeon: [name], MD;  Location: [facility];  Service: Neurosurgery;  Laterality: Right;  . ROBOT ASSISTED LAPAROSCOPIC RADICAL PROSTATECTOMY    . TONSILECTOMY/ADENOIDECTOMY WITH MYRINGOTOMY    :   Current  Facility-Administered Medications:  .  acetaminophen (TYLENOL) tablet 650 mg, 650 mg, Oral, Q6H PRN, Chotiner, Yevonne Aline, MD .  albuterol (VENTOLIN HFA) 108 (90 Base) MCG/ACT inhaler 2 puff, 2 puff, Inhalation, Q4H PRN, Chotiner, Yevonne Aline, MD .  ascorbic acid (VITAMIN C) tablet 500 mg, 500 mg, Oral, Daily, Chotiner, Yevonne Aline, MD .  aspirin EC tablet 81 mg, 81 mg, Oral, Daily, Chotiner, Yevonne Aline, MD .  azithromycin (ZITHROMAX) 500 mg in sodium chloride 0.9 % 250 mL IVPB, 500 mg, Intravenous, Q24H, Chotiner, Yevonne Aline, MD .  cefTRIAXone (ROCEPHIN) 1 g in sodium chloride 0.9 % 100 mL IVPB, 1 g, Intravenous, Q24H, Chotiner, Yevonne Aline, MD .  enoxaparin (LOVENOX) injection 40 mg, 40 mg, Subcutaneous, Q24H, Chotiner, Yevonne Aline, MD .  ezetimibe (ZETIA) tablet 10 mg, 10 mg, Oral, Daily, Chotiner, Yevonne Aline, MD .  guaiFENesin-dextromethorphan (ROBITUSSIN DM) 100-10 MG/5ML syrup 10 mL, 10 mL, Oral, Q4H PRN, Chotiner, Yevonne Aline, MD .  lactated ringers infusion, , Intravenous, Continuous, Chotiner, Yevonne Aline, MD, Last Rate: 75 mL/hr at 05/24/20 0505, New Bag at 05/24/20 0505 .  senna-docusate (Senokot-S) tablet 1 tablet, 1 tablet, Oral, QHS PRN, Chotiner, Yevonne Aline, MD .  Tbo-Filgrastim [facility]) injection 480 mcg, 480 mcg, Subcutaneous, q1800, [name], MD .  zinc sulfate capsule 220 mg, 220 mg, Oral, Daily, Chotiner, Yevonne Aline, MD  Facility-Administered Medications Ordered in Other Encounters:  .  octreotide (SANDOSTATIN LAR) IM injection 30 mg, 30 mg, Intramuscular, Once, Miasia Crabtree, Rudell Cobb, MD:  . vitamin C  500 mg Oral Daily  . aspirin EC  81 mg Oral Daily  . enoxaparin (LOVENOX) injection  40 mg Subcutaneous Q24H  . ezetimibe  10 mg Oral Daily  . Tbo-Filgrastim  480 mcg Subcutaneous q1800  . zinc sulfate  220 mg Oral Daily  :  Allergies  Allergen Reactions  . Codeine Nausea And Vomiting  . Hydrocodone Nausea And Vomiting  :  Family History  Problem Relation Age of Onset  . Prostate  cancer Father   . Heart disease Father   . Heart disease Mother   :  Social History   Socioeconomic History  . Marital status: Married    Spouse name: Not on file  . Number of children: 2  . Years of education: Not on file  . Highest education level: Not on file  Occupational History  . Occupation: real estate aprais.    Employer: [employer]  Tobacco Use  . Smoking status: Never Smoker  . Smokeless tobacco: Never Used  . Tobacco comment: never used tobacco  Vaping Use  . Vaping Use: Never used  Substance and Sexual Activity  . Alcohol use: No    Alcohol/week: 0.0 standard drinks  . Drug use: No  . Sexual activity: Not on file  Other Topics Concern  . Not on file  Social History Narrative  . Not on file   Social Determinants of Health   Financial Resource Strain: Not on file  Food Insecurity: Not on file  Transportation Needs: Not on file  Physical Activity: Not on file  Stress: Not on file  Social Connections: Not on file  Intimate Partner Violence: Not on file  :  Review of Systems  Constitutional: Positive for fever and malaise/fatigue.  HENT: Negative.   Eyes: Negative.   Respiratory: Positive for cough.   Cardiovascular: Negative.   Gastrointestinal: Negative.   Genitourinary: Negative.   Musculoskeletal: Positive for joint pain.  Skin: Negative.   Neurological: Negative.   Endo/Heme/Allergies: Negative.   Psychiatric/Behavioral: Negative.      Exam:  Physical Exam Vitals reviewed.  HENT:     Head: Normocephalic and atraumatic.  Eyes:     Pupils: Pupils are equal, round, and reactive to light.  Cardiovascular:     Rate and Rhythm: Normal rate and regular rhythm.     Heart sounds: Normal heart sounds.  Pulmonary:     Effort: Pulmonary effort is normal.     Breath sounds: Normal breath sounds.  Abdominal:     General: Bowel sounds are normal.     Palpations: Abdomen is soft.  Musculoskeletal:        General: No tenderness or  deformity. Normal range of motion.     Cervical back: Normal range of motion.  Lymphadenopathy:     Cervical: No cervical adenopathy.  Skin:    General: Skin is warm and dry.     Findings: No erythema or rash.  Neurological:     Mental Status: He is alert and oriented to person, place, and time.  Psychiatric:        Behavior: Behavior normal.        Thought Content: Thought content normal.        Judgment: Judgment normal.     Patient Vitals for the past 24 hrs:  BP Temp Temp src Pulse Resp SpO2 Height Weight  05/24/20 0427 (!) 149/79 (!) 102.8 F (39.3 C) Oral (!) 102 18 99 % -- --  05/24/20 0032 (!) 142/110 99.3 F (37.4 C) Oral 97 18 99 % -- --  05/23/20 2338 (!) 159/83 99.5 F (37.5 C) -- 98 -- 100 % -- --  05/23/20 2031 -- 98.9 F (37.2 C) Oral -- -- -- -- --  05/23/20 2030 121/84 -- -- 100 (!) 22 97 % -- --  05/23/20 2000 127/74 -- -- 97 17 95 % -- --  05/23/20 1930 125/81 -- -- 100 (!) 22 98 % -- --  05/23/20 1900 (!) 134/99 -- -- 100 (!) 21 94 % -- --  05/23/20 1847 -- (!) 102.2 F (39 C) Oral -- -- -- -- --  05/23/20 1840 (!) 150/84 -- -- (!) 104 (!) 22 98 % -- --  05/23/20 1804 -- -- -- -- -- -- 5\' 8"  (1.727 m) 155 lb (70.3 kg)  05/23/20 1801 (!) 148/83 (!) 103.3 F (39.6 C) Oral (!) 112 20 98 % -- --     Recent Labs    05/23/20 1839  WBC 2.4*  HGB 9.6*  HCT 29.5*  PLT 342   Recent Labs    05/23/20 1839  NA 134*  K 3.8  CL 99  CO2 25  GLUCOSE 118*  BUN 19  CREATININE 1.47*  CALCIUM 8.9    Blood smear review:  None  Pathology: None    Assessment and Plan: Mr. Goins is a 75 year old white male.  He has metastatic neuroendocrine carcinoma of unknown primary.  He has had this for several years.  Unfortunately, looks like it is now becoming more resilient to therapy.  He has COVID.  As such, he has been treated for this.  I will go ahead and give a dose of Neupogen to help with his white cell count.  As far as his tumor is concerned, we  will have to see what we have to do with respect to his therapy.  Again has been on quite a few protocols.  We may go back to a protocol that we initially tried quite a while back.  Again, he needs to get through the [facility] [place].  I know that he will get fantastic care from all the staff up on [address].  Lattie Haw, MD  [name] 1:5-7

## 2020-05-24 NOTE — Telephone Encounter (Signed)
Per scheduling message 05/24/20 Lorriane Shire - called patient and gave him updated schedule

## 2020-05-24 NOTE — TOC Progression Note (Signed)
Transition of Care [facility]) - Progression Note    Patient Details  Name: [name] MRN: [id number] Date of Birth: 1945/09/24  [facility] CM/SW Contact  Purcell Mouton, RN Phone Number: 05/24/2020, 3:27 PM  Clinical Narrative:    Pt from home and plan to return home with no needs at present time.    Expected Discharge Plan: Home/Self Care Barriers to Discharge: No Barriers Identified  Expected Discharge Plan and Services Expected Discharge Plan: Home/Self Care       Living arrangements for the past 2 months: Single Family Home                                       Social Determinants of Health (SDOH) Interventions    Readmission Risk Interventions No flowsheet data found.

## 2020-05-24 NOTE — H&P (Addendum)
History and Physical    [name] TWS:[id number] DOB: [birth year] DOA: 05/23/2020  PCP: Michael Dress, MD   Patient coming from: Home  Chief Complaint: Cough, fever  HPI: [name] is a 75 y.o. 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 [facility] with fever and cough for past 3-4 days. Reports fever to 103 F.  Reports the cough is dry.  He used Tylenol at home for the fever.  He states he has not had any shortness of breath, abdominal pain, nausea, vomiting, diarrhea, urinary frequency or dysuria.  He states he has not had any change in taste or smell.  He is currently being treated with chemotherapy every 3 weeks and his last session was 2 weeks ago.  He has not been vaccinated for COVID-19.  Lives with his family and no one in the family's been vaccinated for COVID-19 no but has been sick he states.   ED Course: [name] is found to be COVID-19 positive in the emergency room.  He has neutropenia with WBC of 2400.  His chronic anemia that is stable with a hemoglobin of 9.6 and hematocrit of 29.5.  Platelets are 342.  Electrolytes are normal.  Has stable CKD stage IIIa.  Lactic acid is 1.2.  D-dimer is 2.04.  Fibrinogen is 479.  He refuses remdesivir or other specific COVID-19 therapies due to their side effects. Hospitalist service has been asked to admit for further management  Review of Systems:  General: Reports fever and chills. Denies weakness, weight loss, night sweats.  Denies dizziness.   HENT: Denies head trauma, headache, denies change in hearing, tinnitus.  Denies nasal congestion or bleeding.  Denies sore throat, sores in mouth.  Denies difficulty swallowing Eyes: Denies blurry vision, pain in eye, drainage.  Denies discoloration of eyes. Neck: Denies pain.  Denies swelling.  Denies pain with movement. Cardiovascular: Denies chest pain,  palpitations.  Denies edema.  Denies orthopnea Respiratory: Reports cough. Denies shortness of breath.  Denies wheezing.  Denies sputum production Gastrointestinal: Denies abdominal pain, swelling.  Denies nausea, vomiting, diarrhea.  Denies melena.  Denies hematemesis. Musculoskeletal: Denies limitation of movement. Denies deformity or swelling. Denies arthralgias or myalgias. Genitourinary: Denies pelvic pain.  Denies urinary frequency or hesitancy.  Denies dysuria.  Skin: Denies rash.  Denies petechiae, purpura, ecchymosis. Neurological: Denies syncope.  Denies seizure activity. Denies paresthesia.  Denies slurred speech, drooping face.  Denies visual change. Psychiatric: Denies depression, anxiety. Denies hallucinations.  Past Medical History:  Diagnosis Date  . Cancer, metastatic to bone ([place]) 08/24/2015  . Complication of anesthesia   . Constipation    due to pain medication  . 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 Dyslipidemia 07/12/2014   Overview:  Intolerant to multiple statins  Formatting of this note might be different from the original. Intolerant to multiple statins  . Dyspnea    with exertion  . Essential hypertension 07/12/2014  . Goals of care, counseling/discussion 01/29/2018  . High blood sugar   . History of radiation therapy 04/13/20-04/27/20   IMRT- Pelvis - Dr. Gery Sellers  . Iron deficiency anemia due to chronic blood loss 02/04/2019  . Ischemic chest pain ([place]) 07/12/2014   Formatting of this note might be different from the original. Minimally abnormal stress test but does not want to do cardiac catheterization, asymptomatic.  . Metastasis to bone (  [place]) 09/12/2016  . Neuroendocrine carcinoma of unknown origin ([place]) 12/14/2016  . Pain of right posterior thigh 02/15/2014  . PONV (postoperative nausea and vomiting)   . Prostate cancer ([place]) 07/23/2011   with mets to sacral, lungs  . Radiation 04/22/14-05/06/14   right sacrum 30 gray  .  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 Type 2 diabetes mellitus ([place]) 03/23/2018    Past Surgical History:  Procedure Laterality Date  . CIRCUMCISION     when patient was 75 years old  . COLONOSCOPY    . COLONOSCOPY W/ POLYPECTOMY    . ESOPHAGOGASTRODUODENOSCOPY    . HAND SURGERY Left    fracture  . IR IMAGING GUIDED PORT INSERTION  02/10/2018  . LUMBAR LAMINECTOMY/DECOMPRESSION MICRODISCECTOMY Right 09/12/2016   Procedure: LAMINECTOMY AND FORAMINOTOMY RIGHT LUMBAR FIVE- SACRAL ONE, SACRAL ONE- SACRAL TWO DECOMPRESSION OF RIGHT SACRAL ONE NERVE ROOT;  Surgeon: Michael Pies, MD;  Location: [facility];  Service: Neurosurgery;  Laterality: Right;  . ROBOT ASSISTED LAPAROSCOPIC RADICAL PROSTATECTOMY    . TONSILECTOMY/ADENOIDECTOMY WITH MYRINGOTOMY      Social History  reports that he has never smoked. He has never used smokeless tobacco. He reports that he does not drink alcohol and does not use drugs.  Allergies  Allergen Reactions  . Codeine Nausea And Vomiting  . Hydrocodone Nausea And Vomiting    Family History  Problem Relation Age of Onset  . Prostate cancer Father   . Heart disease Father   . Heart disease Mother      Prior to Admission medications   Medication Sig Start Date End Date Taking? Authorizing Provider  aspirin 81 MG tablet Take 81 mg by mouth daily.    [provider]  Calcium Carbonate-Vitamin D (OYSTER SHELL CALCIUM/D) 250-125 MG-UNIT TABS Take by mouth daily.     [provider]  CALCIUM-IRON-VIT D-VIT K PO Take 1 tablet by mouth 2 (two) times daily.    [provider]  dexamethasone (DECADRON) 4 MG tablet TAKE 5 TABLETS BY MOUTH DAILY **TAKE WITH FOOD** Patient not taking: No sig reported 03/21/20 03/21/21  [name], MD  ezetimibe (ZETIA) 10 MG tablet TAKE 1 TABLET BY MOUTH DAILY 12/01/19 11/30/20  Michael Dress, MD  gabapentin  (NEURONTIN) 300 MG capsule TAKE 1 CAPSULE BY MOUTH TWICE A DAY THEN TAKE 2 CAPSULES AT BEDTIME Patient taking differently: 04/20/2020 One in am and two at hs. 04/20/20 04/20/21  Ennever, Rudell Cobb, MD  [facility] 10 GM/15ML SOLN daily as needed. 05/07/19   [provider]  isosorbide mononitrate (IMDUR) 30 MG 24 hr tablet TAKE 1 TABLET (30 MG TOTAL) BY MOUTH DAILY. 03/30/20 03/30/21  Park Liter, MD  lidocaine-prilocaine (EMLA) cream  12/17/18   [provider]  OVER THE COUNTER MEDICATION every morning. Juice Plus -- 8 capsule of each the garden, vineyard, and orchard twice a day.    [provider]  pantoprazole (PROTONIX) 40 MG tablet TAKE 1 TABLET (40 MG TOTAL) BY MOUTH AT BEDTIME. 03/03/20 03/03/21  [name], MD  prochlorperazine (COMPAZINE) 10 MG tablet TAKE 1 TABLET (10 MG TOTAL) BY MOUTH EVERY 6 (SIX) HOURS AS NEEDED (NAUSEA OR VOMITING). 04/29/19   [name], MD  senna-docusate (SENOKOT-S) 8.6-50 MG tablet Take 2-4 tablets by mouth at bedtime.    [provider]  ZETIA 10 MG tablet Take 10 mg by mouth daily.  06/29/14  [provider]  Zinc 50 MG CAPS Take 50 mg by mouth daily.    [provider]    Physical Exam: Vitals:   05/23/20 2030 05/23/20 2031 05/23/20 2338 05/24/20 0032  BP: 121/84  (!) 159/83 (!) 142/110  Pulse: 100  98 97  Resp: (!) 22   18  Temp:  98.9 F (37.2 C) 99.5 F (37.5 C) 99.3 F (37.4 C)  TempSrc:  Oral  Oral  SpO2: 97%  100% 99%  Weight:      Height:        Constitutional: NAD, calm, comfortable Vitals:   05/23/20 2030 05/23/20 2031 05/23/20 2338 05/24/20 0032  BP: 121/84  (!) 159/83 (!) 142/110  Pulse: 100  98 97  Resp: (!) 22   18  Temp:  98.9 F (37.2 C) 99.5 F (37.5 C) 99.3 F (37.4 C)  TempSrc:  Oral  Oral  SpO2: 97%  100% 99%  Weight:      Height:       General: WDWN, Alert and oriented x3.  Eyes: EOMI, PERRL, conjunctivae normal.  Sclera nonicteric HENT:  [place]/AT, external  ears normal.  Nares patent without epistasis.  Mucous membranes are moist.  Neck: Soft, normal range of motion, supple, no masses, no thyromegaly.  Trachea midline Respiratory: clear to auscultation bilaterally, no wheezing, no crackles. Normal respiratory effort. No accessory muscle use.  Cardiovascular: Regular rate and rhythm, no murmurs / rubs / gallops. No extremity edema. 2+ pedal pulses.  Abdomen: Soft, no tenderness, nondistended, no rebound or guarding.  No masses palpated. Bowel sounds normoactive Musculoskeletal: FROM. no cyanosis. No joint deformity upper and lower extremities. Normal muscle tone.  Skin: Warm, dry, intact no rashes, lesions, ulcers. No induration Neurologic: CN 2-12 grossly intact. Normal speech. Sensation intact, Strength 5/5 in all extremities.   Psychiatric: Normal judgment and insight.  Normal mood.    Labs on Admission: I have personally reviewed following labs and imaging studies  CBC: Recent Labs  Lab 05/23/20 1839  WBC 2.4*  NEUTROABS 1.1*  HGB 9.6*  HCT 29.5*  MCV 103.1*  PLT 562    Basic Metabolic Panel: Recent Labs  Lab 05/23/20 1839  NA 134*  K 3.8  CL 99  CO2 25  GLUCOSE 118*  BUN 19  CREATININE 1.47*  CALCIUM 8.9    GFR: Estimated Creatinine Clearance: 43.9 mL/min (A) (by C-G formula based on SCr of 1.47 mg/dL (H)).  Liver Function Tests: Recent Labs  Lab 05/23/20 1839  AST 20  ALT 19  ALKPHOS 50  BILITOT 0.4  PROT 6.8  ALBUMIN 3.6    Urine analysis:    Component Value Date/Time   COLORURINE YELLOW 05/23/2020 2225   APPEARANCEUR CLEAR 05/23/2020 2225   LABSPEC <1.005 (L) 05/23/2020 2225   PHURINE 6.0 05/23/2020 2225   GLUCOSEU NEGATIVE 05/23/2020 2225   HGBUR NEGATIVE 05/23/2020 2225   BILIRUBINUR NEGATIVE 05/23/2020 2225   KETONESUR NEGATIVE 05/23/2020 2225   PROTEINUR NEGATIVE 05/23/2020 2225   NITRITE NEGATIVE 05/23/2020 2225   LEUKOCYTESUR NEGATIVE 05/23/2020 2225    Radiological Exams on  Admission: DG Chest Portable 1 View  Result Date: 05/23/2020 CLINICAL DATA:  Cough and fever for several days EXAM: PORTABLE CHEST 1 VIEW COMPARISON:  03/23/2018 FINDINGS: Cardiac shadow is stable. Right chest wall port is again seen and stable. Lungs are well aerated bilaterally. Patchy left basilar airspace opacity is noted which may represent early infiltrate. No bony abnormality is noted. IMPRESSION: Findings suspicious for  early infiltrate in the left base. Electronically Signed   By: Inez Catalina M.D.   On: 05/23/2020 18:38    EKG: Independently reviewed. EKG shows sinus tachycardia. No ST elevation or depression. QTc is 476  Assessment/Plan Principal Problem:   Pneumonia due to COVID-19 virus Pt refuses treatment with Remdisivir, paxlovid, or molnupiravir. He requests hydroxychloroquine and ivermectin which are not approved for COVID-19 and will not be used. He is high risk to have worsening of clinical condition based upon immunodeficiency with chemotherapy and cancer.  Supplemental oxygen provided keep O2 sat between 92 to 96% Albuterol MDI every 4 hours as needed for wheezing cough, shortness of breath Antitussives provided Incentive spirometer every 1-2 hours while awake D-Dimer is elevated and since COVID-19 is a prothrombotic condition with high rate of causing thromboembolism,  will obtain CT angiography of chest to make sure pulmonary embolism is not present.  If pulmonary embolism is present it would change patient's level of required anticoagulation  Discussed with patient the use of convalescent plasma repeat COVID-19.  Explained that it is used under an emergency authorization in patients that are hospitalized who are at high risk of worsening COVID infection which he would qualify for due to his cancer and chemotherapy.  I reviewed the risks and benefits of convalescent plasma therapy with him and he consents and states he would like to be treated with convalescent plasma.   Order is placed.  Active Problems:   CAP (community acquired pneumonia) Mr. Kaps has possible early infiltrate in the left lung base and was started on Rocephin and azithromycin in the emergency room.  We will continue antibiotic therapy to cover for superimposed bacterial infection with COVID    Neutropenia with fever  WBC is decreased to 2400.  We will monitor CBC with labs in morning    Essential hypertension Patient is currently not on antihypertensive therapy.  Will monitor pressure.  Patient blood pressure is currently acceptable.    Type 2 diabetes mellitus Diet-controlled diabetes mellitus.  Recheck glucose with labs in morning    CKD (chronic kidney disease) stage 3, A Stable chronic kidney disease.  Monitor with electrolytes and renal function on labs in morning    Secondary neuroendocrine tumor of bone  Followed by oncology and states he is on chemotherapy.  Last chemotherapy was 2 weeks ago     DVT prophylaxis: Lovenox for DVT prophylaxis. Pt has elevated D-dimer with Covid-19 infection. CTA chest pending and if PE identified will convert to therapeutic anticoagulation. Code Status:   Full code Family Communication:  Diagnosis and plan reviewed with patient. Patient refuses all approved treatments for COVID-19 infection.  He states he will not take remdesivir or other medications approved for COVID infection due to "their side effects".  Patient request ivermectin and hydroxychloroquine and he is informed that these are not approved medications and medical studies have been done and shows that they are not effective treatments and therefore will not be dispensed by pharmacy. Further recommendations to follow as clinical indicated Patient is high risk to have worsening COVID infection and increased risk of morbidity and mortality and is encouraged to allow treatment with Remdisivir or other approved therapy. He understands and refuses.  Disposition Plan:   Patient is  from:  Home  Anticipated DC to:  Home  Anticipated DC date:  Anticipate 2 midnight or more stay in the hospital to treat acute condition  Anticipated DC barriers: No barriers to discharge identified at this time  Admission  status:  Inpatient   Yevonne Aline Avrohom Mckelvin MD [facility]  How to contact the [facility] Attending or Consulting provider [place] or covering provider during after hours [place], for this patient?   1. Check the care team in [facility] and look for a) attending/consulting [facility] provider listed and b) the [facility] team listed 2. Log into [email or web address] and use 's universal password to access. If you do not have the password, please contact the hospital operator. 3. Locate the [facility] provider you are looking for under [facility] and page to a number that you can be directly reached. 4. If you still have difficulty reaching the provider, please page the [facility] (Director on Call) for the Hospitalists listed on amion for assistance.  05/24/2020, 1:55 AM

## 2020-05-24 NOTE — Progress Notes (Signed)
Noted CT chest has been completed. Lab in to draw blood. Noted blood samples are clotting at a very rapid pace. Second phlebotomist to attempt lab draws. On going assessment

## 2020-05-24 NOTE — Progress Notes (Signed)
Monoclonal antibodies given to patient. After discussion with the doctor and the patient benadryl was given as premedication. Tylenol was also given for a fever. Will remain in room 15 minutes following injection monitoring for signs of reaction.

## 2020-05-24 NOTE — Plan of Care (Signed)
PLAN OF CARE DISCUSSED WITH THE PATIENT AND HE EXPRESSED AN UNDERSTANDING

## 2020-05-24 NOTE — Telephone Encounter (Signed)
CALLED PATIENT'S WIFE- SHEILA TO RESCHEDULE FU APPT. ON 05-30-20 DUE TO HAVING COVID, PATIENT'S APPT. HAS BEEN RESCHEDULED FOR 06-09-20 @ 9:15 AM, PATIENT'S WIFE- SHEILA AGREED TO NEW APPT. DAY AND TIME

## 2020-05-25 LAB — CBC WITH DIFFERENTIAL/PLATELET
Abs Immature Granulocytes: 0.08 10*3/uL — ABNORMAL HIGH (ref 0.00–0.07)
Basophils Absolute: 0 10*3/uL (ref 0.0–0.1)
Basophils Relative: 0 %
Eosinophils Absolute: 0 10*3/uL (ref 0.0–0.5)
Eosinophils Relative: 0 %
HCT: 27.4 % — ABNORMAL LOW (ref 39.0–52.0)
Hemoglobin: 8.6 g/dL — ABNORMAL LOW (ref 13.0–17.0)
Immature Granulocytes: 1 %
Lymphocytes Relative: 15 %
Lymphs Abs: 0.9 10*3/uL (ref 0.7–4.0)
MCH: 33.1 pg (ref 26.0–34.0)
MCHC: 31.4 g/dL (ref 30.0–36.0)
MCV: 105.4 fL — ABNORMAL HIGH (ref 80.0–100.0)
Monocytes Absolute: 0.7 10*3/uL (ref 0.1–1.0)
Monocytes Relative: 12 %
Neutro Abs: 4.4 10*3/uL (ref 1.7–7.7)
Neutrophils Relative %: 72 %
Platelets: 279 10*3/uL (ref 150–400)
RBC: 2.6 MIL/uL — ABNORMAL LOW (ref 4.22–5.81)
RDW: 16.3 % — ABNORMAL HIGH (ref 11.5–15.5)
WBC: 6.1 10*3/uL (ref 4.0–10.5)
nRBC: 0 % (ref 0.0–0.2)

## 2020-05-25 LAB — C-REACTIVE PROTEIN: CRP: 7.3 mg/dL — ABNORMAL HIGH (ref <1.0)

## 2020-05-25 LAB — COMPREHENSIVE METABOLIC PANEL
ALT: 16 U/L (ref 0–44)
AST: 20 U/L (ref 15–41)
Albumin: 3 g/dL — ABNORMAL LOW (ref 3.5–5.0)
Alkaline Phosphatase: 32 U/L — ABNORMAL LOW (ref 38–126)
Anion gap: 10 (ref 5–15)
BUN: 17 mg/dL (ref 8–23)
CO2: 24 mmol/L (ref 22–32)
Calcium: 7.8 mg/dL — ABNORMAL LOW (ref 8.9–10.3)
Chloride: 101 mmol/L (ref 98–111)
Creatinine, Ser: 1.57 mg/dL — ABNORMAL HIGH (ref 0.61–1.24)
GFR, Estimated: 47 mL/min — ABNORMAL LOW (ref >60)
Glucose, Bld: 105 mg/dL — ABNORMAL HIGH (ref 70–99)
Potassium: 3.5 mmol/L (ref 3.5–5.1)
Sodium: 135 mmol/L (ref 135–145)
Total Bilirubin: 0.4 mg/dL (ref 0.3–1.2)
Total Protein: 5.8 g/dL — ABNORMAL LOW (ref 6.5–8.1)

## 2020-05-25 LAB — GLUCOSE, CAPILLARY: Glucose-Capillary: 136 mg/dL — ABNORMAL HIGH (ref 70–99)

## 2020-05-25 LAB — D-DIMER, QUANTITATIVE: D-Dimer, Quant: 1.73 ug/mL-FEU — ABNORMAL HIGH (ref 0.00–0.50)

## 2020-05-25 NOTE — Plan of Care (Signed)
  Problem: Education: Goal: Knowledge of risk factors and measures for prevention of condition will improve Outcome: Progressing   Problem: Coping: Goal: Psychosocial and spiritual needs will be supported Outcome: Progressing   Problem: Respiratory: Goal: Will maintain a patent airway Outcome: Progressing Goal: Complications related to the disease process, condition or treatment will be avoided or minimized Outcome: Progressing   

## 2020-05-25 NOTE — Progress Notes (Signed)
[name] is doing okay.  He had a temperature this morning of 101.1.  I suppose this could be from the [place].  He did get the monoclonal antibody treatment for this yesterday.  His labs look okay.  His white cell count 6.1.  Hemoglobin 8.6.  Platelet count 279,000.  His C-reactive protein is 7.3.  His BUN is 17 creatinine 1.57.  He has had no cough.  There is no nausea or vomiting.  He says his pain is doing okay.  He has had no diarrhea.  There has been no rashes.  He has had no leg swelling.  He has had no headache.  On his physical exam, his lungs sound clear.  Cardiac exam regular rate and rhythm.  Abdomen is soft.  He has good bowel sounds.  There is no palpable liver or spleen tip.  Extremities show some atrophy in the right leg.  This is chronic.  Neurological exam is nonfocal.  [name] does have the metastatic neuroendocrine carcinoma.  He has had multiple lines of therapy.  He seems to be progressing.  We will have to decide if we can do any further therapy on him.  For right now, he has this COVID pneumonia.  He seems to be looking pretty good.  He has a temperature this morning.  I know that he is getting outstanding care from all staff on 4 [address].  I appreciate their help.  Lattie Haw, MD  [place]

## 2020-05-25 NOTE — Progress Notes (Signed)
PROGRESS NOTE    [name]  XAJ:[id number] DOB: 1945-11-12 DOA: 05/23/2020 PCP: Nicoletta Dress, MD    Chief Complaint  Patient presents with  . Cough    Brief Narrative:  [name] is a 75 y.o. 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 -Pt declined  Remdisivir, paxlovid, or molnupiravir. He requested hydroxychloroquine and ivermectin which are not approved for COVID-19 and will not be used. 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  Secondary neuroendocrine tumor of bone  Received chemotherapy 2 weeks ago Oncology following here  FTT: use walker at baseline since 2018      Unresulted Labs (From admission, onward)          Start     Ordered   05/31/20 0500  Creatinine, serum  (enoxaparin (LOVENOX)    CrCl >/= 30 ml/min)  Weekly,   R     Comments: while on enoxaparin therapy    05/24/20 0155   05/25/20 1918  Strep pneumoniae urinary antigen  Once,   R        05/25/20 1917   05/25/20 0500  CBC with Differential/Platelet  Daily,   R      05/24/20 0155   05/25/20 0500  Comprehensive metabolic panel  Daily,   R      05/24/20 0155   05/25/20 0500  C-reactive protein  Daily,   R      05/24/20 0155   05/25/20 0500  D-dimer, quantitative  Daily,   R      05/24/20 0155            DVT prophylaxis: enoxaparin (LOVENOX) injection 40 mg Start: 05/24/20 1000   Code Status: Full Family Communication: Wife over the phone Disposition:   Status is: Inpatient  Dispo: The patient is from: Home              Anticipated d/c is to: Home              Anticipated d/c date is: 24 to 48 hours, if fever free, CRP comes down                Consultants:   Hematology/oncology  Procedures:   None  Antimicrobials:   Anti-infectives (From admission, onward)   Start     Dose/Rate Route Frequency Ordered Stop  05/24/20 2000  azithromycin (ZITHROMAX) 500 mg in sodium chloride 0.9 % 250 mL IVPB        500 mg 250 mL/hr over 60 Minutes Intravenous Every 24 hours 05/24/20 0215     05/24/20 1800  cefTRIAXone (ROCEPHIN) 1 g in sodium chloride 0.9 % 100 mL IVPB        1 g 200 mL/hr over 30 Minutes Intravenous Every 24 hours 05/24/20 0215     05/23/20 1915  cefTRIAXone (ROCEPHIN) 1 g in sodium chloride 0.9 % 100 mL IVPB        1 g 200 mL/hr over 30 Minutes Intravenous  Once 05/23/20 1905 05/23/20 1952   05/23/20 1915  azithromycin (ZITHROMAX) 500 mg in sodium chloride 0.9 % 250 mL IVPB        500 mg 250 mL/hr over 60 Minutes Intravenous  Once 05/23/20 1905 05/23/20 2059          Objective: Vitals:   05/24/20 2023 05/24/20 2130 05/25/20 0542 05/25/20 1425   BP: 103/66  138/72 131/71  Pulse: 89  96 84  Resp: 20   18  Temp: 100.3 F (37.9 C) 99.1 F (37.3 C) (!) 101.1 F (38.4 C) 99.1 F (37.3 C)  TempSrc: Oral Oral Oral Oral  SpO2: 94%  93% 98%  Weight:      Height:        Intake/Output Summary (Last 24 hours) at 05/25/2020 1925 Last data filed at 05/25/2020 1814 Gross per 24 hour  Intake 3021.32 ml  Output 500 ml  Net 2521.32 ml   Filed Weights   05/23/20 1804  Weight: 70.3 kg    Examination:  General exam: calm, NAD Respiratory system: Clear to auscultation. Respiratory effort normal. Cardiovascular system: S1 & S2 heard, RRR. No JVD, no murmur, No pedal edema. Gastrointestinal system: Abdomen is nondistended, soft and nontender. No organomegaly or masses felt. Normal bowel sounds heard. Central nervous system: Alert and oriented. No focal neurological deficits. Extremities: generalized weakness, reports at baseline Skin: No rashes, lesions or ulcers Psychiatry: Judgement and insight appear normal. Mood & affect appropriate.     Data Reviewed: I have personally reviewed following labs and imaging studies  CBC: Recent Labs  Lab 05/23/20 1839 05/24/20 0700 05/25/20 0415  WBC 2.4* 2.0* 6.1  NEUTROABS 1.1*  --  4.4  HGB 9.6* 8.6* 8.6*  HCT 29.5* 26.5* 27.4*  MCV 103.1* 104.3* 105.4*  PLT 342 297 169    Basic Metabolic Panel: Recent Labs  Lab 05/23/20 1839 05/24/20 0700 05/25/20 0415  NA 134*  --  135  K 3.8  --  3.5  CL 99  --  101  CO2 25  --  24  GLUCOSE 118*  --  105*  BUN 19  --  17  CREATININE 1.47* 1.36* 1.57*  CALCIUM 8.9  --  7.8*    GFR: Estimated Creatinine Clearance: 41.1 mL/min (A) (by C-G formula based on SCr of 1.57 mg/dL (H)).  Liver Function Tests: Recent Labs  Lab 05/23/20 1839 05/25/20 0415  AST 20 20  ALT 19 16  ALKPHOS 50 32*  BILITOT 0.4 0.4  PROT 6.8 5.8*  ALBUMIN 3.6 3.0*    CBG: Recent Labs  Lab 05/25/20 1222  GLUCAP 136*     Recent Results (from the past  240 hour(s))  Urine Culture     Status: None   Collection Time: 05/17/20 10:59 AM   Specimen: Urine, Clean Catch  Result Value Ref Range Status  Specimen Description   Final    URINE, CLEAN CATCH Performed at [facility] at [facility], [address] [place], [place] [address]    Special Requests   Final    NONE Performed at [facility] at [facility], [address], [place], [place] [address]    Culture   Final    NO GROWTH Performed at [facility], [place] [address], [place], [place] [address]    Report Status 05/18/2020 FINAL  Final  Culture, blood (routine x 2)     Status: None (Preliminary result)   Collection Time: 05/23/20  6:39 PM   Specimen: BLOOD  Result Value Ref Range Status   Specimen Description   Final    BLOOD LEFT ANTECUBITAL Performed at [facility], [address] [place], [place] [address]    Special Requests   Final    BOTTLES DRAWN AEROBIC AND ANAEROBIC Blood Culture adequate volume Performed at [facility], [address] [place], [place] [address]    Culture   Final    NO GROWTH 2 DAYS Performed at [facility], [place] [address] [place], [place] [address]    Report Status PENDING  Incomplete  Culture, blood (routine x 2)     Status: None (Preliminary result)   Collection Time: 05/23/20  6:45 PM   Specimen: BLOOD  Result Value Ref Range Status   Specimen Description   Final    BLOOD RIGHT ANTECUBITAL Performed at [facility], [address] [place], [place] [address]    Special Requests   Final    BOTTLES DRAWN AEROBIC AND ANAEROBIC Blood Culture adequate volume Performed at [facility], [address] [place], [place] [address]    Culture   Final    NO GROWTH 2 DAYS Performed at [facility], [place] [address] [place], [place] [address]    Report Status PENDING  Incomplete  Resp Panel by RT-PCR (Flu A&B, Covid)  Nasopharyngeal Swab     Status: Abnormal   Collection Time: 05/23/20  6:50 PM   Specimen: Nasopharyngeal Swab; Nasopharyngeal(NP) swabs in vial transport medium  Result Value Ref Range Status   SARS Coronavirus 2 by RT PCR POSITIVE (A) NEGATIVE Final    Comment: RESULT CALLED TO, READ BACK BY AND VERIFIED WITH: LISA ADKINS RN AT 2035 ON 05/23/2020 BY I.[email or web address] (NOTE) SARS-CoV-2 target nucleic acids are DETECTED.  The SARS-CoV-2 RNA is generally detectable in upper respiratory specimens during the acute phase of infection. Positive results are indicative of the presence of the identified virus, but do not rule out bacterial infection or co-infection with other pathogens not detected by the test. Clinical correlation with patient history and other diagnostic information is necessary to determine patient infection status. The expected result is Negative.  Fact Sheet for Patients: EntrepreneurPulse.com.au  Fact Sheet for Healthcare Providers: IncredibleEmployment.be  This test is not yet approved or cleared by the [place] FDA and  has been authorized for detection and/or diagnosis of SARS-CoV-2 by FDA under an Emergency Use Authorization (EUA).  This EUA will remain in effect (meaning this  test can be used) for the duration of  the COVID-19 declaration under Section 564(b)(1) of the Act, 21 U.S.C. section 360bbb-3(b)(1), unless the authorization is terminated or revoked sooner.     Influenza A by PCR NEGATIVE NEGATIVE Final   Influenza  B by PCR NEGATIVE NEGATIVE Final    Comment: (NOTE) The Xpert Xpress SARS-CoV-2/FLU/RSV plus assay is intended as an aid in the diagnosis of influenza from Nasopharyngeal swab specimens and should not be used as a sole basis for treatment. Nasal washings and aspirates are unacceptable for Xpert Xpress SARS-CoV-2/FLU/RSV testing.  Fact Sheet for Patients: EntrepreneurPulse.com.au  Fact Sheet  for Healthcare Providers: IncredibleEmployment.be  This test is not yet approved or cleared by the [place] FDA and has been authorized for detection and/or diagnosis of SARS-CoV-2 by FDA under an Emergency Use Authorization (EUA). This EUA will remain in effect (meaning this test can be used) for the duration of the COVID-19 declaration under Section 564(b)(1) of the Act, 21 U.S.C. section 360bbb-3(b)(1), unless the authorization is terminated or revoked.  Performed at [facility], [address], [place], [place] [address]          Radiology Studies: CT ANGIO CHEST PE W OR WO CONTRAST  Result Date: 05/24/2020 CLINICAL DATA:  Cough, fever. COVID positive elevated D-dimer. Suspected pulmonary embolus. 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 EXAM: CT ANGIOGRAPHY CHEST WITH CONTRAST TECHNIQUE: Multidetector CT imaging of the chest was performed using the standard protocol during bolus administration of intravenous contrast. Multiplanar CT image reconstructions and MIPs were obtained to evaluate the vascular anatomy. CONTRAST:  8mL OMNIPAQUE IOHEXOL 350 MG/ML SOLN COMPARISON:  CT angio chest 03/23/2018. CT PET 03/08/2020 FINDINGS: Cardiovascular: Satisfactory opacification of the pulmonary arteries to the segmental level. No evidence of pulmonary embolism. Normal heart size. No significant pericardial effusion. The thoracic aorta is normal in caliber. Mild atherosclerotic plaque of the thoracic aorta. At least 2 vessel coronary artery calcifications. Mediastinum/Nodes: No enlarged mediastinal, hilar, or axillary lymph nodes. Thyroid gland, trachea, and esophagus demonstrate no significant findings. Lungs/Pleura: Interval increase in size of innumerable known pulmonary testes with as an example a right lower lobe paramediastinal (6:97) 3 x 2.2 cm lesion from 2.5 x 1.9 cm. As another example a left base (6:97) 2.1 x 1.7 cm  lesion (from 0.8 x 0.7 cm). At Upper Abdomen: No acute abnormality. Musculoskeletal: No abdominal wall hernia or abnormality. Redemonstration of sclerotic lesion of the posterior right seventh rib (5:123, 9:111). No new suspicious lytic or blastic osseous lesion. No acute displaced fracture. Multilevel degenerative changes of the spine. Review of the MIP images confirms the above findings. IMPRESSION: 1. No pulmonary central or segmental embolus. 2. Interval increase in size of innumerable pulmonary metastases compared to PET-CT 03/08/2020. 3.  Aortic Atherosclerosis (ICD10-I70.0). Electronically Signed   By: Iven Finn M.D.   On: 05/24/2020 04:25        Scheduled Meds: . vitamin C  500 mg Oral Daily  . aspirin EC  81 mg Oral Daily  . Chlorhexidine Gluconate Cloth  6 each Topical Daily  . enoxaparin (LOVENOX) injection  40 mg Subcutaneous Q24H  . ezetimibe  10 mg Oral Daily  . zinc sulfate  220 mg Oral Daily   Continuous Infusions: . sodium chloride    . azithromycin Stopped (05/24/20 2127)  . cefTRIAXone (ROCEPHIN)  IV 1 g (05/25/20 1653)  . famotidine (PEPCID) IV    . lactated ringers 75 mL/hr at 05/25/20 1239     LOS: 1 day   Time spent: 27mins Greater than 50% of this time was spent in counseling, explanation of diagnosis, planning of further management, and coordination of care.   Voice Recognition Viviann Spare dictation system was  used to create this note, attempts have been made to correct errors. Please contact the author with questions and/or clarifications.   Florencia Reasons, MD PhD FACP [facility]  Available via Epic secure chat 7am-7pm for nonurgent issues Please page for urgent issues To page the attending provider between 7A-7P or the covering provider during after hours 7P-7A, please log into the web site [email or web address] and access using universal [place] password for that web site. If you do not have the password, please call the hospital  operator.    05/25/2020, 7:25 PM

## 2020-05-26 ENCOUNTER — Other Ambulatory Visit (HOSPITAL_BASED_OUTPATIENT_CLINIC_OR_DEPARTMENT_OTHER): Payer: Self-pay

## 2020-05-26 ENCOUNTER — Other Ambulatory Visit: Payer: Self-pay

## 2020-05-26 LAB — CBC WITH DIFFERENTIAL/PLATELET
Abs Immature Granulocytes: 1.1 10*3/uL — ABNORMAL HIGH (ref 0.00–0.07)
Basophils Absolute: 0 10*3/uL (ref 0.0–0.1)
Basophils Relative: 0 %
Eosinophils Absolute: 0 10*3/uL (ref 0.0–0.5)
Eosinophils Relative: 0 %
HCT: 27.2 % — ABNORMAL LOW (ref 39.0–52.0)
Hemoglobin: 8.7 g/dL — ABNORMAL LOW (ref 13.0–17.0)
Immature Granulocytes: 8 %
Lymphocytes Relative: 9 %
Lymphs Abs: 1.2 10*3/uL (ref 0.7–4.0)
MCH: 33.5 pg (ref 26.0–34.0)
MCHC: 32 g/dL (ref 30.0–36.0)
MCV: 104.6 fL — ABNORMAL HIGH (ref 80.0–100.0)
Monocytes Absolute: 0.9 10*3/uL (ref 0.1–1.0)
Monocytes Relative: 7 %
Neutro Abs: 10.3 10*3/uL — ABNORMAL HIGH (ref 1.7–7.7)
Neutrophils Relative %: 76 %
Platelets: 300 10*3/uL (ref 150–400)
RBC: 2.6 MIL/uL — ABNORMAL LOW (ref 4.22–5.81)
RDW: 16.2 % — ABNORMAL HIGH (ref 11.5–15.5)
WBC: 13.6 10*3/uL — ABNORMAL HIGH (ref 4.0–10.5)
nRBC: 0 % (ref 0.0–0.2)

## 2020-05-26 LAB — COMPREHENSIVE METABOLIC PANEL
ALT: 15 U/L (ref 0–44)
AST: 22 U/L (ref 15–41)
Albumin: 2.9 g/dL — ABNORMAL LOW (ref 3.5–5.0)
Alkaline Phosphatase: 35 U/L — ABNORMAL LOW (ref 38–126)
Anion gap: 7 (ref 5–15)
BUN: 10 mg/dL (ref 8–23)
CO2: 26 mmol/L (ref 22–32)
Calcium: 8.1 mg/dL — ABNORMAL LOW (ref 8.9–10.3)
Chloride: 104 mmol/L (ref 98–111)
Creatinine, Ser: 1.16 mg/dL (ref 0.61–1.24)
GFR, Estimated: >60 mL/min (ref >60)
Glucose, Bld: 113 mg/dL — ABNORMAL HIGH (ref 70–99)
Potassium: 3.1 mmol/L — ABNORMAL LOW (ref 3.5–5.1)
Sodium: 137 mmol/L (ref 135–145)
Total Bilirubin: 0.3 mg/dL (ref 0.3–1.2)
Total Protein: 5.8 g/dL — ABNORMAL LOW (ref 6.5–8.1)

## 2020-05-26 LAB — D-DIMER, QUANTITATIVE: D-Dimer, Quant: 1.35 ug/mL-FEU — ABNORMAL HIGH (ref 0.00–0.50)

## 2020-05-26 LAB — C-REACTIVE PROTEIN: CRP: 7.1 mg/dL — ABNORMAL HIGH (ref <1.0)

## 2020-05-26 LAB — STREP PNEUMONIAE URINARY ANTIGEN: Strep Pneumo Urinary Antigen: NEGATIVE

## 2020-05-26 MED ORDER — PREDNISONE 10 MG PO TABS
ORAL_TABLET | ORAL | 0 refills | Status: DC
  Filled 2020-05-26: qty 15, 5d supply, fill #0

## 2020-05-26 MED ORDER — HEPARIN SOD (PORK) LOCK FLUSH 100 UNIT/ML IV SOLN
500.0000 [IU] | INTRAVENOUS | Status: AC | PRN
Start: 2020-05-26 — End: 2020-05-26
  Administered 2020-05-26: 500 [IU]

## 2020-05-26 MED ORDER — GUAIFENESIN ER 600 MG PO TB12: 600.0000 mg | ORAL_TABLET | Freq: Two times a day (BID) | ORAL | 0 refills | Status: AC

## 2020-05-26 MED ORDER — ASCORBIC ACID 500 MG PO TABS
500.0000 mg | ORAL_TABLET | Freq: Every day | ORAL | Status: AC
Start: 2020-05-27 — End: ?

## 2020-05-26 MED ORDER — AMOXICILLIN-POT CLAVULANATE 500-125 MG PO TABS
1.0000 | ORAL_TABLET | Freq: Two times a day (BID) | ORAL | 0 refills | Status: AC
  Filled 2020-05-26: qty 4, 2d supply, fill #0

## 2020-05-26 MED ORDER — ACETAMINOPHEN 500 MG PO TABS
500.0000 mg | ORAL_TABLET | ORAL | 0 refills | Status: AC | PRN
  Filled 2020-05-26: qty 30, 5d supply, fill #0

## 2020-05-26 MED ORDER — POTASSIUM CHLORIDE CRYS ER 20 MEQ PO TBCR
40.0000 meq | EXTENDED_RELEASE_TABLET | ORAL | Status: AC
  Administered 2020-05-26 (×2): 40 meq via ORAL
  Filled 2020-05-26 (×2): qty 2

## 2020-05-26 NOTE — Plan of Care (Signed)
  Problem: Respiratory: Goal: Will maintain a patent airway Outcome: Adequate for Discharge   Problem: Respiratory: Goal: Complications related to the disease process, condition or treatment will be avoided or minimized Outcome: Adequate for Discharge

## 2020-05-26 NOTE — Plan of Care (Signed)
  Problem: Education: Goal: Knowledge of risk factors and measures for prevention of condition will improve Outcome: Progressing   Problem: Coping: Goal: Psychosocial and spiritual needs will be supported Outcome: Progressing   Problem: Respiratory: Goal: Will maintain a patent airway Outcome: Progressing Goal: Complications related to the disease process, condition or treatment will be avoided or minimized Outcome: Progressing   

## 2020-05-26 NOTE — Progress Notes (Signed)
[name] is still in the hospital.  I suspect he probably was Because of the temperature he had yesterday.  He looks great.  He feels good.  He has had no temperature.  There is no cough or shortness of breath.  He has had no nausea or vomiting.  He has a lot of "gas."  There is no constipation or diarrhea.  His labs today show white cell count of 13.6.  Hemoglobin 8.7.  Platelet count 302,000.  His BUN is 10 creatinine 1.16.  Calcium 8.1 with an albumin of 2.9.  There is been no headache.  His vital signs show temperature 98.6.  Pulse 89.  Blood pressure 146/75.  His head neck exam shows no scleral icterus.  There is no oral lesions.  He has no thrush.  There is no adenopathy in the neck.  Lungs are clear bilaterally.  He has good air movement bilaterally.  Cardiac exam regular rate and rhythm.  Abdomen is soft.  Bowel sounds are present.  He has no guarding or rebound tenderness.  Extremity shows the chronic atrophy in his right leg.  Neurological exam is nonfocal.  [name] does have the COVID-pneumonia.  He has metastatic neuroendocrine carcinoma of unknown primary.  Hopefully, he will be able to go home today.  I do not see a problem with him going home from a oncologic point of view.  He is very appreciative of the wonderful care that he is gotten from all the staff on [address].  Lattie Haw, MD  Oswaldo Milian 41:10

## 2020-05-26 NOTE — Discharge Summary (Signed)
Discharge Summary  [name] OTL:[id number] DOB: [birth year]  PCP: Nicoletta Dress, MD  Admit date: 05/23/2020 Discharge date: 05/26/2020  Time spent:  77mins  Recommendations for Outpatient Follow-up:  1. F/u with PCP within a week  for hospital discharge follow up, repeat cbc/bmp at follow up 2. F/u with hematology/oncology Dr Marin Olp    Discharge Diagnoses:  Active Hospital Problems   Diagnosis Date Noted  . Pneumonia due to COVID-19 virus 05/24/2020  . CKD (chronic kidney disease) stage 3, GFR 30-59 ml/min (HCC) 05/24/2020  . CAP (community acquired pneumonia) 05/24/2020  . Neutropenia with fever ([place]) 05/24/2020  . Type 2 diabetes mellitus ([place]) 03/23/2018  . 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 Essential hypertension 07/12/2014    Resolved Hospital Problems  No resolved problems to display.    Discharge Condition: stable  Diet recommendation: regular diet  Filed Weights   05/23/20 1804  Weight: 70.3 kg    History of present illness: ( per admitting MD Dr Tonie Griffith) Patient coming from: Home  Chief Complaint: Cough, fever  HPI: [name] is a 75 y.o. 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 He presented to [facility] ER with fever and cough for past 3-4 days. Reports fever to 103 F.  Reports the cough is dry.  He used Tylenol at home for the fever.  He states he has not had any shortness of breath, abdominal pain, nausea, vomiting, diarrhea, urinary frequency or dysuria.  He states he has not had any change in taste or smell.  He is currently being treated with chemotherapy every 3 weeks and his last session was 2 weeks ago.  He has not been vaccinated for COVID-19.  Lives with his family and no one in the family's been vaccinated for COVID-19 no but has been sick he states.   ED Course: Mr. Naugle is found to be COVID-19  positive in the emergency room.  He has neutropenia with WBC of 2400.  His chronic anemia that is stable with a hemoglobin of 9.6 and hematocrit of 29.5.  Platelets are 342.  Electrolytes are normal.  Has stable CKD stage IIIa.  Lactic acid is 1.2.  D-dimer is 2.04.  Fibrinogen is 479.  He refuses remdesivir or other specific COVID-19 therapies due to their side effects. Hospitalist service has been asked to admit for further management  Hospital Course:  Principal Problem:   Pneumonia due to COVID-19 virus Active Problems:   Essential hypertension   Secondary neuroendocrine tumor of bone (HCC)   Type 2 diabetes mellitus ([place])   CKD (chronic kidney disease) stage 3, GFR 30-59 ml/min (HCC)   CAP (community acquired pneumonia)   Neutropenia with fever ([place])   COVID pneumonia in an immunosuppressed individual undergoing active chemotherapy with neutropenia -not vaccinated -cxr possible for early infiltrate in the left base. -Pt declined  Remdisivir, paxlovid, or molnupiravir. He requested hydroxychloroquine and ivermectin which are not approved for COVID-19 and will not be used. -he received monoclonal antibody bebtelvimab -received rocephin and zithromax for possible superimposed bacterial infection due to immunosuppressed status,  - CRP D-dimer trending down, he is on room air, fever resolved -he reports feeling better and desires to go home, he will discharge home on augmentin for two more days to finish total of 5 days treatment for possible superimposed bacterial infection, he agreed to 5days taper dose of  prednisone  To ensure crp  continue to trendown. He is to follow up with pcp next week.   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   DVT prophylaxis while in the hospital : enoxaparin (LOVENOX) injection 40 mg Start: 05/24/20 1000   Code Status: Full Family Communication: Wife over the phone on 5/11 Disposition: home     Consultants:   Hematology/oncology  Procedures:   None   Abx:  Anti-infectives (From admission, onward)   Start     Dose/Rate Route Frequency Ordered Stop   05/26/20 0000  amoxicillin-clavulanate (AUGMENTIN) 500-125 MG tablet        1 tablet Oral 2 times daily 05/26/20 0954 05/28/20 2359   05/24/20 2000  azithromycin (ZITHROMAX) 500 mg in sodium chloride 0.9 % 250 mL IVPB        500 mg 250 mL/hr over 60 Minutes Intravenous Every 24 hours 05/24/20 0215     05/24/20 1800  cefTRIAXone (ROCEPHIN) 1 g in sodium chloride 0.9 % 100 mL IVPB        1 g 200 mL/hr over 30 Minutes Intravenous Every 24 hours 05/24/20 0215     05/23/20 1915  cefTRIAXone (ROCEPHIN) 1 g in sodium chloride 0.9 % 100 mL IVPB        1 g 200 mL/hr over 30 Minutes Intravenous  Once 05/23/20 1905 05/23/20 1952   05/23/20 1915  azithromycin (ZITHROMAX) 500 mg in sodium chloride 0.9 % 250 mL IVPB        500 mg 250 mL/hr over 60 Minutes Intravenous  Once 05/23/20 1905 05/23/20 2059       Discharge Exam: BP (!) 143/75 (BP Location: Right Arm)   Pulse 89   Temp 98.6 F (37 C) (Oral)   Resp 20   Ht 5\' 8"  (1.727 m)   Wt 70.3 kg   SpO2 96%   BMI 23.57 kg/m   General: NAD Cardiovascular: RRR Respiratory: normal respiratory effort   Discharge Instructions You were cared for by a hospitalist during your hospital stay. If you have any questions about your discharge medications or the care you  received while you were in the hospital after you are discharged, you can call the unit and asked to speak with the hospitalist on call if the hospitalist that took care of you is not available. Once you are discharged, your primary care physician will handle any further medical issues. Please note that NO REFILLS for any discharge medications will be authorized once you are discharged, as it is imperative that you return to your primary care physician (or establish a relationship with a primary care physician if you do not have one) for your aftercare needs so that they can reassess your need for medications and monitor your lab values.  Discharge Instructions    Diet general   Complete by: As directed    Increase activity slowly   Complete by: As directed      Allergies as of 05/26/2020      Reactions  Codeine Nausea And Vomiting   Hydrocodone Nausea And Vomiting      Medication List    STOP taking these medications   dexamethasone 4 MG tablet Commonly known as: DECADRON     TAKE these medications   acetaminophen 500 MG tablet Commonly known as: TYLENOL Take 1 tablet (500 mg total) by mouth every 4 (four) hours as needed for mild pain, headache or fever (fever >/= 101).   amoxicillin-clavulanate 500-125 MG tablet Commonly known as: Augmentin Take 1 tablet (500 mg total) by mouth 2 (two) times daily for 2 days.   ascorbic acid 500 MG tablet Commonly known as: VITAMIN C Take 1 tablet (500 mg total) by mouth daily. Start taking on: May 27, 2020   aspirin 81 MG tablet Take 81 mg by mouth daily.   ezetimibe 10 MG tablet Commonly known as: ZETIA TAKE 1 TABLET BY MOUTH DAILY What changed:   how much to take  Another medication with the same name was removed. Continue taking this medication, and follow the directions you see here.   gabapentin 300 MG capsule Commonly known as: NEURONTIN TAKE 1 CAPSULE BY MOUTH TWICE A DAY THEN TAKE 2 CAPSULES AT BEDTIME What changed:    how much to take  how to take this  when to take this   Generlac 10 GM/15ML Soln Generic drug: lactulose (encephalopathy) Take 10 g by mouth daily as needed (constipation).   guaiFENesin 600 MG 12 hr tablet Commonly known as: Mucinex Take 1 tablet (600 mg total) by mouth 2 (two) times daily for 14 days.   isosorbide mononitrate 30 MG 24 hr tablet Commonly known as: IMDUR TAKE 1 TABLET (30 MG TOTAL) BY MOUTH DAILY. What changed:   how much to take  when to take this   [place] every morning. Juice Plus -- 8 capsule of each the garden, vineyard, and orchard twice a day.   pantoprazole 40 MG tablet Commonly known as: PROTONIX TAKE 1 TABLET (40 MG TOTAL) BY MOUTH AT BEDTIME. What changed: how much to take   predniSONE 10 MG tablet Commonly known as: DELTASONE Label  & dispense according to the schedule below. 5 Pills PO on day one then, 4 Pills PO on day two, 3 Pills PO on day three, 2Pills PO on day four, 1 Pills PO on day five,  then STOP.  Total of 15tabs   prochlorperazine 10 MG tablet Commonly known as: COMPAZINE TAKE 1 TABLET (10 MG TOTAL) BY MOUTH EVERY 6 (SIX) HOURS AS NEEDED (NAUSEA OR VOMITING). What changed: See the new instructions.   Zinc 50 MG Caps Take 50 mg by mouth daily.      Allergies  Allergen Reactions  . Codeine Nausea And Vomiting  . Hydrocodone Nausea And Vomiting    Follow-up Information    Nicoletta Dress, MD Follow up in 1 week(s).   Specialty: Internal Medicine Why: hospital discharge follow up appoitment, repeat basic labs including cbc/bmp at follow up appointment. please call your pcp if fever returns, cough/sob gets worse or new chest pain. Contact information: [place] [address] [phone]                The results of significant diagnostics from this hospitalization (including imaging, microbiology, ancillary and laboratory) are listed below for reference.     Significant Diagnostic Studies: CT ANGIO CHEST PE W OR WO CONTRAST  Result Date: 05/24/2020 CLINICAL DATA:  Cough, fever. COVID positive  elevated D-dimer. Suspected pulmonary embolus. 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 EXAM: CT ANGIOGRAPHY CHEST WITH CONTRAST TECHNIQUE: Multidetector CT imaging of the chest was performed using the standard protocol during bolus administration of intravenous contrast. Multiplanar CT image reconstructions and MIPs were obtained to evaluate the vascular anatomy. CONTRAST:  61mL OMNIPAQUE IOHEXOL 350 MG/ML SOLN COMPARISON:  CT angio chest 03/23/2018. CT PET 03/08/2020 FINDINGS: Cardiovascular: Satisfactory opacification of the pulmonary arteries to the segmental level. No evidence of pulmonary embolism. Normal heart size. No significant pericardial effusion. The thoracic aorta is normal in caliber. Mild atherosclerotic plaque of the thoracic aorta. At least 2 vessel coronary artery calcifications. Mediastinum/Nodes: No enlarged mediastinal, hilar, or axillary lymph nodes. Thyroid gland, trachea, and esophagus demonstrate no significant findings. Lungs/Pleura: Interval increase in size of innumerable known pulmonary testes with as an example a right lower lobe paramediastinal (6:97) 3 x 2.2 cm lesion from 2.5 x 1.9 cm. As another example a left base (6:97) 2.1 x 1.7 cm lesion (from 0.8 x 0.7 cm). At Upper Abdomen: No acute abnormality. Musculoskeletal: No abdominal wall hernia or abnormality. Redemonstration of sclerotic lesion of the posterior right seventh rib (5:123, 9:111). No new suspicious lytic or blastic osseous lesion. No acute displaced fracture. Multilevel degenerative changes of the spine. Review of the MIP images confirms the above findings. IMPRESSION: 1. No pulmonary central or segmental embolus. 2. Interval increase in size of innumerable pulmonary metastases compared to PET-CT 03/08/2020. 3.  Aortic Atherosclerosis  (ICD10-I70.0). Electronically Signed   By: Iven Finn M.D.   On: 05/24/2020 04:25   MR SACRUM SI JOINTS W WO CONTRAST  Result Date: 05/22/2020 CLINICAL DATA:  Metastatic neuroendocrine tumor to the right sacrum. Post radiation therapy. Assess treatment response. EXAM: MRI PELVIS WITHOUT AND WITH CONTRAST TECHNIQUE: Multiplanar multisequence MR imaging of the pelvis was performed both before and after administration of intravenous contrast. CONTRAST:  7.79mL GADAVIST GADOBUTROL 1 MMOL/ML IV SOLN COMPARISON:  MR lumbar spine 03/19/2020, PET-CT 03/08/2020 and pelvic MRI 07/28/2016. FINDINGS: Bones/Joint/Cartilage The patient has a chronic mixed sclerotic and lytic lesion involving the right sacral ala which demonstrates low T1 and high T2 signal. This lesion measures approximately 5.4 x 3.5 x 5.9 cm and does appear mildly progressive from the pelvic MRI of 2018. Following contrast, there is heterogeneous enhancement of the osseous lesion. In addition, there is heterogeneous epidural soft tissue extending along the right S1 and S2 nerve root sleeves. On the most recent PET-CT, activity associated with this lesion was primarily within this epidural component and not within the bone. There are probable new small enhancing lesions in the right iliac bone adjacent to the sacroiliac joint measuring 8 mm on image 22/9, a 10 mm left sacral lesion on image 20/9 and a 9 mm lesion in the lower sacrum (image 12/9). No evidence of pathologic fracture. The visualized hip joints appear unremarkable. Ligaments Not relevant for exam/indication. Muscles and Tendons The pelvic muscles appear unremarkable without abnormal enhancement or fluid collection. No focal muscular atrophy. Soft tissues Mild concentric rectal wall thickening, likely related to radiation therapy. No evidence of pelvic adenopathy or ascites. IMPRESSION: 1. The large lytic lesion involving the right sacral ala has enlarged from remote pelvic MRI, although  appears similar in size to the more recent PET CTs which did not show associated activity in the bone. The lesion does show some enhancement on MRI, which can be seen subsequent to radiation therapy. 2. Associated epidural enhancement along  the right S1 and S2 nerve roots with radiotracer activity on recent PET-CT, likely representing viable tumor to some degree. 3. Additional enhancing osseous lesions in the right iliac bone and sacrum, suspicious for additional metastases, new from remote MRI. Electronically Signed   By: Richardean Sale M.D.   On: 05/22/2020 17:43   DG Chest Portable 1 View  Result Date: 05/23/2020 CLINICAL DATA:  Cough and fever for several days EXAM: PORTABLE CHEST 1 VIEW COMPARISON:  03/23/2018 FINDINGS: Cardiac shadow is stable. Right chest wall port is again seen and stable. Lungs are well aerated bilaterally. Patchy left basilar airspace opacity is noted which may represent early infiltrate. No bony abnormality is noted. IMPRESSION: Findings suspicious for early infiltrate in the left base. Electronically Signed   By: Inez Catalina M.D.   On: 05/23/2020 18:38    Microbiology: Recent Results (from the past 240 hour(s))  Urine Culture     Status: None   Collection Time: 05/17/20 10:59 AM   Specimen: Urine, Clean Catch  Result Value Ref Range Status   Specimen Description   Final    URINE, CLEAN CATCH Performed at [facility] at [facility], [address], [place], [place] [address]    Special Requests   Final    NONE Performed at [facility] at [facility], [address], [place], [place] [address]    Culture   Final    NO GROWTH Performed at [facility], [place] [address], [place], [place] [address]    Report Status 05/18/2020 FINAL  Final  Culture, blood (routine x 2)     Status: None (Preliminary result)   Collection Time: 05/23/20  6:39 PM   Specimen: BLOOD  Result Value Ref Range Status   Specimen  Description   Final    BLOOD LEFT ANTECUBITAL Performed at [facility], [address] [place], [place] [address]    Special Requests   Final    BOTTLES DRAWN AEROBIC AND ANAEROBIC Blood Culture adequate volume Performed at [facility], [address] [place], [place] [address]    Culture   Final    NO GROWTH 3 DAYS Performed at [facility], [place] [address], [place], [place] [address]    Report Status PENDING  Incomplete  Culture, blood (routine x 2)     Status: None (Preliminary result)   Collection Time: 05/23/20  6:45 PM   Specimen: BLOOD  Result Value Ref Range Status   Specimen Description   Final    BLOOD RIGHT ANTECUBITAL Performed at [facility], [address] [place], [place] [address]    Special Requests   Final    BOTTLES DRAWN AEROBIC AND ANAEROBIC Blood Culture adequate volume Performed at [facility], [address] [place], [place] [address]    Culture   Final    NO GROWTH 3 DAYS Performed at [facility], [place] [address] [place], [place] [address]    Report Status PENDING  Incomplete  Resp Panel by RT-PCR (Flu A&B, Covid) Nasopharyngeal Swab     Status: Abnormal   Collection Time: 05/23/20  6:50 PM   Specimen: Nasopharyngeal Swab; Nasopharyngeal(NP) swabs in vial transport medium  Result Value Ref Range Status   SARS Coronavirus 2 by RT PCR POSITIVE (A) NEGATIVE Final    Comment: RESULT CALLED TO, READ BACK BY AND VERIFIED  WITH: Sheffield Slider RN AT 2035 ON 05/23/2020 BY I.[email or web address] (NOTE) SARS-CoV-2 target nucleic acids are DETECTED.  The SARS-CoV-2 RNA is generally detectable in upper respiratory specimens during the acute phase of infection. Positive results are indicative of the presence of the identified virus, but do not rule out bacterial infection or co-infection with other pathogens not detected by the test. Clinical correlation with patient history and other diagnostic information is  necessary to determine patient infection status. The expected result is Negative.  Fact Sheet for Patients: EntrepreneurPulse.com.au  Fact Sheet for Healthcare Providers: IncredibleEmployment.be  This test is not yet approved or cleared by the [place] FDA and  has been authorized for detection and/or diagnosis of SARS-CoV-2 by FDA under an Emergency Use Authorization (EUA).  This EUA will remain in effect (meaning this  test can be used) for the duration of  the COVID-19 declaration under Section 564(b)(1) of the Act, 21 U.S.C. section 360bbb-3(b)(1), unless the authorization is terminated or revoked sooner.     Influenza A by PCR NEGATIVE NEGATIVE Final   Influenza B by PCR NEGATIVE NEGATIVE Final    Comment: (NOTE) The Xpert Xpress SARS-CoV-2/FLU/RSV plus assay is intended as an aid in the diagnosis of influenza from Nasopharyngeal swab specimens and should not be used as a sole basis for treatment. Nasal washings and aspirates are unacceptable for Xpert Xpress SARS-CoV-2/FLU/RSV testing.  Fact Sheet for Patients: EntrepreneurPulse.com.au  Fact Sheet for Healthcare Providers: IncredibleEmployment.be  This test is not yet approved or cleared by the [place] FDA and has been authorized for detection and/or diagnosis of SARS-CoV-2 by FDA under an Emergency Use Authorization (EUA). This EUA will remain in effect (meaning this test can be used) for the duration of the COVID-19 declaration under Section 564(b)(1) of the Act, 21 U.S.C. section 360bbb-3(b)(1), unless the authorization is terminated or revoked.  Performed at [facility], [address] [place], [place] [address]      Labs: Basic Metabolic Panel: Recent Labs  Lab 05/23/20 1839 05/24/20 0700 05/25/20 0415 05/26/20 0500  NA 134*  --  135 137  K 3.8  --  3.5 3.1*  CL 99  --  101 104  CO2 25  --  24 26  GLUCOSE  118*  --  105* 113*  BUN 19  --  17 10  CREATININE 1.47* 1.36* 1.57* 1.16  CALCIUM 8.9  --  7.8* 8.1*   Liver Function Tests: Recent Labs  Lab 05/23/20 1839 05/25/20 0415 05/26/20 0500  AST 20 20 22   ALT 19 16 15   ALKPHOS 50 32* 35*  BILITOT 0.4 0.4 0.3  PROT 6.8 5.8* 5.8*  ALBUMIN 3.6 3.0* 2.9*   No results for input(s): LIPASE, AMYLASE in the last 168 hours. No results for input(s): AMMONIA in the last 168 hours. CBC: Recent Labs  Lab 05/23/20 1839 05/24/20 0700 05/25/20 0415 05/26/20 0500  WBC 2.4* 2.0* 6.1 13.6*  NEUTROABS 1.1*  --  4.4 10.3*  HGB 9.6* 8.6* 8.6* 8.7*  HCT 29.5* 26.5* 27.4* 27.2*  MCV 103.1* 104.3* 105.4* 104.6*  PLT 342 297 279 300   Cardiac Enzymes: No results for input(s): CKTOTAL, CKMB, CKMBINDEX, TROPONINI in the last 168 hours. BNP: BNP (last 3 results) No results for input(s): BNP in the last 8760 hours.  ProBNP (last 3 results) No results for input(s): PROBNP in the last 8760 hours.  CBG: Recent Labs  Lab 05/25/20 1222  GLUCAP 136*  Signed:  Florencia Reasons MD, PhD, FACP  [facility] 05/26/2020, 9:59 AM

## 2020-05-28 LAB — CULTURE, BLOOD (ROUTINE X 2)
Culture: NO GROWTH
Culture: NO GROWTH
Special Requests: ADEQUATE
Special Requests: ADEQUATE

## 2020-05-30 ENCOUNTER — Ambulatory Visit: Payer: Medicare Other | Admitting: Radiation Oncology

## 2020-05-31 ENCOUNTER — Other Ambulatory Visit: Payer: Medicare Other

## 2020-05-31 ENCOUNTER — Ambulatory Visit: Payer: Medicare Other | Admitting: Hematology & Oncology

## 2020-05-31 ENCOUNTER — Ambulatory Visit: Payer: Medicare Other

## 2020-06-02 DIAGNOSIS — J1282 Pneumonia due to coronavirus disease 2019: Secondary | ICD-10-CM | POA: Diagnosis not present

## 2020-06-02 DIAGNOSIS — J189 Pneumonia, unspecified organism: Secondary | ICD-10-CM | POA: Diagnosis not present

## 2020-06-02 DIAGNOSIS — N189 Chronic kidney disease, unspecified: Secondary | ICD-10-CM | POA: Diagnosis not present

## 2020-06-02 DIAGNOSIS — N179 Acute kidney failure, unspecified: Secondary | ICD-10-CM | POA: Diagnosis not present

## 2020-06-02 DIAGNOSIS — U071 COVID-19: Secondary | ICD-10-CM | POA: Diagnosis not present

## 2020-06-03 ENCOUNTER — Encounter: Payer: Self-pay | Admitting: Radiology

## 2020-06-03 ENCOUNTER — Other Ambulatory Visit (HOSPITAL_BASED_OUTPATIENT_CLINIC_OR_DEPARTMENT_OTHER): Payer: Self-pay

## 2020-06-03 DIAGNOSIS — M9901 Segmental and somatic dysfunction of cervical region: Secondary | ICD-10-CM | POA: Diagnosis not present

## 2020-06-03 DIAGNOSIS — M9903 Segmental and somatic dysfunction of lumbar region: Secondary | ICD-10-CM | POA: Diagnosis not present

## 2020-06-03 DIAGNOSIS — M9902 Segmental and somatic dysfunction of thoracic region: Secondary | ICD-10-CM | POA: Diagnosis not present

## 2020-06-03 DIAGNOSIS — M9904 Segmental and somatic dysfunction of sacral region: Secondary | ICD-10-CM | POA: Diagnosis not present

## 2020-06-03 MED FILL — Pantoprazole Sodium EC Tab 40 MG (Base Equiv): ORAL | 30 days supply | Qty: 30 | Fill #1 | Status: AC

## 2020-06-03 MED FILL — Isosorbide Mononitrate Tab ER 24HR 30 MG: ORAL | 90 days supply | Qty: 90 | Fill #0 | Status: CN

## 2020-06-03 MED FILL — Ezetimibe Tab 10 MG: ORAL | 90 days supply | Qty: 90 | Fill #0 | Status: AC

## 2020-06-06 DIAGNOSIS — M9903 Segmental and somatic dysfunction of lumbar region: Secondary | ICD-10-CM | POA: Diagnosis not present

## 2020-06-06 DIAGNOSIS — M9902 Segmental and somatic dysfunction of thoracic region: Secondary | ICD-10-CM | POA: Diagnosis not present

## 2020-06-06 DIAGNOSIS — M9904 Segmental and somatic dysfunction of sacral region: Secondary | ICD-10-CM | POA: Diagnosis not present

## 2020-06-06 DIAGNOSIS — M9901 Segmental and somatic dysfunction of cervical region: Secondary | ICD-10-CM | POA: Diagnosis not present

## 2020-06-07 ENCOUNTER — Inpatient Hospital Stay (HOSPITAL_BASED_OUTPATIENT_CLINIC_OR_DEPARTMENT_OTHER): Payer: Medicare Other | Admitting: Hematology & Oncology

## 2020-06-07 ENCOUNTER — Encounter: Payer: Self-pay | Admitting: Hematology & Oncology

## 2020-06-07 ENCOUNTER — Inpatient Hospital Stay: Payer: Medicare Other

## 2020-06-07 ENCOUNTER — Other Ambulatory Visit: Payer: Self-pay

## 2020-06-07 VITALS — BP 115/65 | HR 88 | Temp 98.4°F | Resp 18 | Wt 156.0 lb

## 2020-06-07 DIAGNOSIS — Z95828 Presence of other vascular implants and grafts: Secondary | ICD-10-CM

## 2020-06-07 DIAGNOSIS — C7A8 Other malignant neuroendocrine tumors: Secondary | ICD-10-CM

## 2020-06-07 DIAGNOSIS — C61 Malignant neoplasm of prostate: Secondary | ICD-10-CM | POA: Diagnosis not present

## 2020-06-07 DIAGNOSIS — R918 Other nonspecific abnormal finding of lung field: Secondary | ICD-10-CM | POA: Diagnosis not present

## 2020-06-07 DIAGNOSIS — D509 Iron deficiency anemia, unspecified: Secondary | ICD-10-CM | POA: Diagnosis not present

## 2020-06-07 DIAGNOSIS — Z885 Allergy status to narcotic agent status: Secondary | ICD-10-CM | POA: Diagnosis not present

## 2020-06-07 DIAGNOSIS — K909 Intestinal malabsorption, unspecified: Secondary | ICD-10-CM | POA: Diagnosis not present

## 2020-06-07 DIAGNOSIS — M2550 Pain in unspecified joint: Secondary | ICD-10-CM | POA: Diagnosis not present

## 2020-06-07 DIAGNOSIS — C7B8 Other secondary neuroendocrine tumors: Secondary | ICD-10-CM

## 2020-06-07 DIAGNOSIS — Z79899 Other long term (current) drug therapy: Secondary | ICD-10-CM | POA: Diagnosis not present

## 2020-06-07 DIAGNOSIS — R531 Weakness: Secondary | ICD-10-CM | POA: Diagnosis not present

## 2020-06-07 LAB — RETICULOCYTES
Immature Retic Fract: 21.2 % — ABNORMAL HIGH (ref 2.3–15.9)
RBC.: 2.69 MIL/uL — ABNORMAL LOW (ref 4.22–5.81)
Retic Count, Absolute: 51.1 10*3/uL (ref 19.0–186.0)
Retic Ct Pct: 1.9 % (ref 0.4–3.1)

## 2020-06-07 LAB — CBC WITH DIFFERENTIAL (CANCER CENTER ONLY)
Abs Immature Granulocytes: 0.03 10*3/uL (ref 0.00–0.07)
Basophils Absolute: 0 10*3/uL (ref 0.0–0.1)
Basophils Relative: 0 %
Eosinophils Absolute: 0.1 10*3/uL (ref 0.0–0.5)
Eosinophils Relative: 1 %
HCT: 27.8 % — ABNORMAL LOW (ref 39.0–52.0)
Hemoglobin: 8.9 g/dL — ABNORMAL LOW (ref 13.0–17.0)
Immature Granulocytes: 1 %
Lymphocytes Relative: 16 %
Lymphs Abs: 0.8 10*3/uL (ref 0.7–4.0)
MCH: 33.3 pg (ref 26.0–34.0)
MCHC: 32 g/dL (ref 30.0–36.0)
MCV: 104.1 fL — ABNORMAL HIGH (ref 80.0–100.0)
Monocytes Absolute: 0.8 10*3/uL (ref 0.1–1.0)
Monocytes Relative: 17 %
Neutro Abs: 3.2 10*3/uL (ref 1.7–7.7)
Neutrophils Relative %: 65 %
Platelet Count: 306 10*3/uL (ref 150–400)
RBC: 2.67 MIL/uL — ABNORMAL LOW (ref 4.22–5.81)
RDW: 16.5 % — ABNORMAL HIGH (ref 11.5–15.5)
WBC Count: 4.9 10*3/uL (ref 4.0–10.5)
nRBC: 0 % (ref 0.0–0.2)

## 2020-06-07 LAB — CMP (CANCER CENTER ONLY)
ALT: 14 U/L (ref 0–44)
AST: 13 U/L — ABNORMAL LOW (ref 15–41)
Albumin: 3.9 g/dL (ref 3.5–5.0)
Alkaline Phosphatase: 46 U/L (ref 38–126)
Anion gap: 8 (ref 5–15)
BUN: 20 mg/dL (ref 8–23)
CO2: 27 mmol/L (ref 22–32)
Calcium: 9.6 mg/dL (ref 8.9–10.3)
Chloride: 103 mmol/L (ref 98–111)
Creatinine: 1.32 mg/dL — ABNORMAL HIGH (ref 0.61–1.24)
GFR, Estimated: 57 mL/min — ABNORMAL LOW (ref >60)
Glucose, Bld: 112 mg/dL — ABNORMAL HIGH (ref 70–99)
Potassium: 3.7 mmol/L (ref 3.5–5.1)
Sodium: 138 mmol/L (ref 135–145)
Total Bilirubin: 0.3 mg/dL (ref 0.3–1.2)
Total Protein: 6.4 g/dL — ABNORMAL LOW (ref 6.5–8.1)

## 2020-06-07 LAB — LACTATE DEHYDROGENASE: LDH: 188 U/L (ref 98–192)

## 2020-06-07 MED ORDER — PALONOSETRON HCL INJECTION 0.25 MG/5ML
INTRAVENOUS | Status: AC
  Filled 2020-06-07: qty 5

## 2020-06-07 MED ORDER — SODIUM CHLORIDE 0.9% FLUSH
10.0000 mL | Freq: Once | INTRAVENOUS | Status: AC
Start: 2020-06-07 — End: 2020-06-07
  Administered 2020-06-07: 10 mL via INTRAVENOUS
  Filled 2020-06-07: qty 10

## 2020-06-07 MED ORDER — HEPARIN SOD (PORK) LOCK FLUSH 100 UNIT/ML IV SOLN
500.0000 [IU] | Freq: Once | INTRAVENOUS | Status: AC
  Administered 2020-06-07: 500 [IU] via INTRAVENOUS
  Filled 2020-06-07: qty 5

## 2020-06-07 NOTE — Addendum Note (Signed)
Addended by: Fabio Neighbors A on: 06/07/2020 12:07 PM   Modules accepted: Orders

## 2020-06-07 NOTE — Progress Notes (Signed)
Hematology and Oncology Follow Up Visit  [name] [id number] 1945/01/24 75 y.o. 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 Iron def anemia -- malabsorption  Past Therapy: Zytiga 1000 mg by mouth daily - discontinued on 08/15/2016 Xtandi 160 mg po q day - start on 08/15/2016 - discontinued Radium-223 therapy -- s/p cycle #6 Palliative radiation to the right sacrum Lutathera - s/p cycle 4/4 --last dose on 07/11/2017 Carbo/VP-16/Tecentriq -- S/p cycle #5 Keytruda q 6 week -- Maintanence -- s/p cycle #3 - changed from 3 to 6 week on 06/30/2018 Taxotere/Keytruda -- start on 09/17/2018 -- s/p cycle #10 -- d/c due to progression  Current Therapy: Lurbinectedin --started on 04/29/2019, s/p cycle 17 -- d/c on 05/2020 due to progression CDDP/Irinotecan -- start cycle #1 on 06/15/2020 Xgeva 120 mg subcutaneous Q3 months - due in 07/2020 Lupron 22 mg IM every 3 months - due in 07/2020  SBRT to sacral lesion  IV iron as indicated    Interim History:  [name] is here today for follow-up.  He recently was in the hospital with [name].  He is not sure how he got COVID.  He was all in the hospital for a couple days.  He made a very nice recovery.  He feels good right now.  However, while he was in the hospital, he had scans done.  They want to make sure there is no pulmonary embolism.  There is no pulmonary embolism but he had increase in his lung nodules.  He has also had a MRI of the sacrum.  There is also seems to be some increase in disease down in the sacral area.  His last chromogranin A level was up to 1000.  I think that we probably are going to have to make a change in his protocol.  I know that our options are not all that great at this point.  He has been through quite a few lines of therapy.  I think that he would be worthwhile to try cisplatin/irinotecan.  We  have had some success with this protocol.  I think he would be a good candidate for this.  I think he could tolerate this.  I went over the side effects with he and his wife.  I explained that we have to watch out for diarrhea, low blood counts, nausea.  I think that we probably can get started on this next week.  He really needs a little bit more time off from having the [place].  He is eating okay.  He is having no problems with cough or shortness of breath.  There is no nausea or vomiting.  He is having no bleeding.  There is no hematuria.  He is having no diarrhea.  His blood sugars seem to be doing quite well right now.  I know his family doctor is doing a great job in trying to help manage this.  Overall, I would say his performance status is ECOG 1.    Medications:  Allergies as of 06/07/2020      Reactions   Codeine Nausea And Vomiting   Hydrocodone Nausea And Vomiting      Medication List       Accurate as of Jun 07, 2020 11:30 AM. If you have any questions, ask your nurse or doctor.        acetaminophen 500 MG tablet Commonly known as: TYLENOL Take 1 tablet (500 mg  total) by mouth every 4 (four) hours as needed for mild pain, headache or fever (fever >/= 101).   ascorbic acid 500 MG tablet Commonly known as: VITAMIN C Take 1 tablet (500 mg total) by mouth daily.   aspirin 81 MG tablet Take 81 mg by mouth daily.   dexamethasone 4 MG tablet Commonly known as: DECADRON Take 4 mg by mouth daily. X 3 days   ezetimibe 10 MG tablet Commonly known as: ZETIA TAKE 1 TABLET BY MOUTH DAILY What changed: how much to take   gabapentin 300 MG capsule Commonly known as: NEURONTIN TAKE 1 CAPSULE BY MOUTH TWICE A DAY THEN TAKE 2 CAPSULES AT BEDTIME What changed:   how much to take  how to take this  when to take this   Generlac 10 GM/15ML Soln Generic drug: lactulose (encephalopathy) Take 10 g by mouth daily as needed (constipation).   guaiFENesin 600 MG 12 hr  tablet Commonly known as: Mucinex Take 1 tablet (600 mg total) by mouth 2 (two) times daily for 14 days.   isosorbide mononitrate 30 MG 24 hr tablet Commonly known as: IMDUR TAKE 1 TABLET (30 MG TOTAL) BY MOUTH DAILY. What changed:   how much to take  when to take this   [place] every morning. Juice Plus -- 8 capsule of each the garden, vineyard, and orchard twice a day.   pantoprazole 40 MG tablet Commonly known as: PROTONIX TAKE 1 TABLET (40 MG TOTAL) BY MOUTH AT BEDTIME. What changed: how much to take   predniSONE 10 MG tablet Commonly known as: DELTASONE Take 5 tablets by mouth on day 1 then, 4 tablets on day 2, then 3 tabs on day 3, then 2 tabs on day 4, then 1 tab on day 5, then STOP.   prochlorperazine 10 MG tablet Commonly known as: COMPAZINE TAKE 1 TABLET (10 MG TOTAL) BY MOUTH EVERY 6 (SIX) HOURS AS NEEDED (NAUSEA OR VOMITING).   Zinc 50 MG Caps Take 50 mg by mouth daily.       Allergies:  Allergies  Allergen Reactions  . Codeine Nausea And Vomiting  . Hydrocodone Nausea And Vomiting    Past Medical History, Surgical history, Social history, and Family History were reviewed and updated.  Review of Systems: Review of Systems  Constitutional: Negative.   HENT: Negative.   Eyes: Negative.   Respiratory: Negative.   Cardiovascular: Negative.   Gastrointestinal: Negative.   Genitourinary: Negative.   Musculoskeletal: Positive for joint pain.  Skin: Negative.   Neurological: Positive for focal weakness.  Endo/Heme/Allergies: Negative.   Psychiatric/Behavioral: Negative.      Physical Exam:  weight is 156 lb (70.8 kg). His oral temperature is 98.4 F (36.9 C). His blood pressure is 115/65 and his pulse is 88. His respiration is 18 and oxygen saturation is 100%.   Wt Readings from Last 3 Encounters:  06/07/20 156 lb (70.8 kg)  06/07/20 156 lb (70.8 kg)  05/23/20 155 lb (70.3 kg)    Physical Exam Vitals reviewed.  HENT:      Head: Normocephalic and atraumatic.  Eyes:     Pupils: Pupils are equal, round, and reactive to light.  Cardiovascular:     Rate and Rhythm: Normal rate and regular rhythm.     Heart sounds: Normal heart sounds.  Pulmonary:     Effort: Pulmonary effort is normal.     Breath sounds: Normal breath sounds.  Abdominal:     General: Bowel sounds are  normal.     Palpations: Abdomen is soft.  Musculoskeletal:        General: No tenderness or deformity. Normal range of motion.     Cervical back: Normal range of motion.  Lymphadenopathy:     Cervical: No cervical adenopathy.  Skin:    General: Skin is warm and dry.     Findings: No erythema or rash.  Neurological:     Mental Status: He is alert and oriented to person, place, and time.  Psychiatric:        Behavior: Behavior normal.        Thought Content: Thought content normal.        Judgment: Judgment normal.    Lab Results  Component Value Date   WBC 4.9 06/07/2020   HGB 8.9 (L) 06/07/2020   HCT 27.8 (L) 06/07/2020   MCV 104.1 (H) 06/07/2020   PLT 306 06/07/2020   Lab Results  Component Value Date   FERRITIN 459 (H) 05/23/2020   IRON 45 05/10/2020   TIBC 226 05/10/2020   UIBC 181 05/10/2020   IRONPCTSAT 20 05/10/2020   Lab Results  Component Value Date   RETICCTPCT 1.9 06/07/2020   RBC 2.69 (L) 06/07/2020   RBC 2.67 (L) 06/07/2020   No results found for: KPAFRELGTCHN, LAMBDASER, KAPLAMBRATIO No results found for: IGGSERUM, IGA, IGMSERUM No results found for: Kathrynn Ducking, MSPIKE, SPEI   Chemistry      Component Value Date/Time   NA 138 06/07/2020 1031   NA 144 12/14/2016 1159   NA 140 10/22/2016 1129   K 3.7 06/07/2020 1031   K 3.5 12/14/2016 1159   K 4.3 10/22/2016 1129   CL 103 06/07/2020 1031   CL 103 12/14/2016 1159   CO2 27 06/07/2020 1031   CO2 30 12/14/2016 1159   CO2 27 10/22/2016 1129   BUN 20 06/07/2020 1031   BUN 16 12/14/2016 1159   BUN 14.0  10/22/2016 1129   CREATININE 1.32 (H) 06/07/2020 1031   CREATININE 1.3 (H) 12/14/2016 1159   CREATININE 1.1 10/22/2016 1129      Component Value Date/Time   CALCIUM 9.6 06/07/2020 1031   CALCIUM 10.0 12/14/2016 1159   CALCIUM 10.4 10/22/2016 1129   ALKPHOS 46 06/07/2020 1031   ALKPHOS 48 12/14/2016 1159   ALKPHOS 60 10/22/2016 1129   AST 13 (L) 06/07/2020 1031   AST 16 10/22/2016 1129   ALT 14 06/07/2020 1031   ALT 23 12/14/2016 1159   ALT 14 10/22/2016 1129   BILITOT 0.3 06/07/2020 1031   BILITOT 0.31 10/22/2016 1129       Impression and Plan: MichaelWrightis 75 yo caucasian gentleman with metastatic neuroendocrine carcinoma of unknown primary.  Everything has been holding pretty steady except for down of the sacral area.  We have given him some localized radiation therapy to this area to help prevent problems.  So far it seems to be helping.  We will make a switch over to cisplatin/irinotecan.  Again, I think this would be reasonable.  We will go ahead and time to get him set up for next week.  I will probably give him 3 cycles and then repeat the scans.  I think that the Chromogranin A level will help [place] see how things are going.  I just want quality of life to be a primary goal.  I know that with summertime, and, he and his wife would like to do some traveling.  I want to  make sure that he is able to do this.  We may have to make some dosage adjustments with respect to his chemotherapy so that he will tolerate treatment.  I will plan to get him back when we start his second cycle of cisplatin/irinotecan in June.   [name], MD 5/24/202211:30 AM

## 2020-06-07 NOTE — Patient Instructions (Signed)
Implanted Port Insertion, Care After This sheet gives you information about how to care for yourself after your procedure. Your health care provider may also give you more specific instructions. If you have problems or questions, contact your health care provider. What can I expect after the procedure? After the procedure, it is common to have:  Discomfort at the port insertion site.  Bruising on the skin over the port. This should improve over 3-4 days. Follow these instructions at home: Port care  After your port is placed, you will get a manufacturer's information card. The card has information about your port. Keep this card with you at all times.  Take care of the port as told by your health care provider. Ask your health care provider if you or a family member can get training for taking care of the port at home. A home health care nurse may also take care of the port.  Make sure to remember what type of port you have. Incision care  Follow instructions from your health care provider about how to take care of your port insertion site. Make sure you: ? Wash your hands with soap and water before and after you change your bandage (dressing). If soap and water are not available, use hand sanitizer. ? Change your dressing as told by your health care provider. ? Leave stitches (sutures), skin glue, or adhesive strips in place. These skin closures may need to stay in place for 2 weeks or longer. If adhesive strip edges start to loosen and curl up, you may trim the loose edges. Do not remove adhesive strips completely unless your health care provider tells you to do that.  Check your port insertion site every day for signs of infection. Check for: ? Redness, swelling, or pain. ? Fluid or blood. ? Warmth. ? Pus or a bad smell.      Activity  Return to your normal activities as told by your health care provider. Ask your health care provider what activities are safe for you.  Do not  lift anything that is heavier than 10 lb (4.5 kg), or the limit that you are told, until your health care provider says that it is safe. General instructions  Take over-the-counter and prescription medicines only as told by your health care provider.  Do not take baths, swim, or use a hot tub until your health care provider approves. Ask your health care provider if you may take showers. You may only be allowed to take sponge baths.  Do not drive for 24 hours if you were given a sedative during your procedure.  Wear a medical alert bracelet in case of an emergency. This will tell any health care providers that you have a port.  Keep all follow-up visits as told by your health care provider. This is important. Contact a health care provider if:  You cannot flush your port with saline as directed, or you cannot draw blood from the port.  You have a fever or chills.  You have redness, swelling, or pain around your port insertion site.  You have fluid or blood coming from your port insertion site.  Your port insertion site feels warm to the touch.  You have pus or a bad smell coming from the port insertion site. Get help right away if:  You have chest pain or shortness of breath.  You have bleeding from your port that you cannot control. Summary  Take care of the port as told by your   health care provider. Keep the manufacturer's information card with you at all times.  Change your dressing as told by your health care provider.  Contact a health care provider if you have a fever or chills or if you have redness, swelling, or pain around your port insertion site.  Keep all follow-up visits as told by your health care provider. This information is not intended to replace advice given to you by your health care provider. Make sure you discuss any questions you have with your health care provider. Document Revised: 07/30/2017 Document Reviewed: 07/30/2017 Elsevier Patient Education   2021 [employer] Inc.  

## 2020-06-08 ENCOUNTER — Other Ambulatory Visit (HOSPITAL_BASED_OUTPATIENT_CLINIC_OR_DEPARTMENT_OTHER): Payer: Self-pay

## 2020-06-08 ENCOUNTER — Encounter: Payer: Self-pay | Admitting: Hematology & Oncology

## 2020-06-08 ENCOUNTER — Telehealth: Payer: Self-pay

## 2020-06-08 DIAGNOSIS — M9904 Segmental and somatic dysfunction of sacral region: Secondary | ICD-10-CM | POA: Diagnosis not present

## 2020-06-08 DIAGNOSIS — M9903 Segmental and somatic dysfunction of lumbar region: Secondary | ICD-10-CM | POA: Diagnosis not present

## 2020-06-08 DIAGNOSIS — M9901 Segmental and somatic dysfunction of cervical region: Secondary | ICD-10-CM | POA: Diagnosis not present

## 2020-06-08 DIAGNOSIS — M9902 Segmental and somatic dysfunction of thoracic region: Secondary | ICD-10-CM | POA: Diagnosis not present

## 2020-06-08 LAB — IRON AND TIBC
Iron: 81 ug/dL (ref 42–163)
Saturation Ratios: 31 % (ref 20–55)
TIBC: 259 ug/dL (ref 202–409)
UIBC: 178 ug/dL (ref 117–376)

## 2020-06-08 LAB — FERRITIN: Ferritin: 706 ng/mL — ABNORMAL HIGH (ref 24–336)

## 2020-06-08 LAB — ERYTHROPOIETIN: Erythropoietin: 34.5 m[IU]/mL — ABNORMAL HIGH (ref 2.6–18.5)

## 2020-06-08 LAB — CHROMOGRANIN A: Chromogranin A (ng/mL): 819.2 ng/mL — ABNORMAL HIGH (ref 0.0–101.8)

## 2020-06-08 MED ORDER — PROCHLORPERAZINE MALEATE 10 MG PO TABS
10.0000 mg | ORAL_TABLET | Freq: Four times a day (QID) | ORAL | 3 refills | Status: AC | PRN
  Filled 2020-06-08: qty 60, 15d supply, fill #0

## 2020-06-08 MED FILL — Isosorbide Mononitrate Tab ER 24HR 30 MG: ORAL | 90 days supply | Qty: 90 | Fill #0 | Status: CN

## 2020-06-08 NOTE — Progress Notes (Signed)
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  The patient was last seen on 03/24/20 for re-evaluation. Since then, the patient started undergoing SBRT treatment and completed treatment on 04/27/20.   The patient followed up with Dr. Marin Olp on 05/10/20 and stated that the radiation treatment to the sacrum has helped with the pain that he has been experiencing.    The patient received an MRI of pelvis without contrast on 05/21/20. Results showed that the large lytic lesion incolving the tight sacral ala has enlarged from the remote pelvic MRI, although the lesion appears similar in size to the more recent PET CT's which did not show associated activity in the bone. The lesion does show some enhancement on MRI, which was described as subsequent to radiation therapy. There was also additional enhancing osseous lesions seen in the right iliac bone and sacrum, suspicious for additional metastases.  It is worth mentioning that the patient had an ED visit on 05/24/20 for sepsis due to COVID-19. He presented to [facility] ER with fever and cough for the previous 3-4 days and reported having a fever of 103 F. The ED note from the hospitalist stated that the patient has not been vaccinated for COVID-19. It was also noted that the patient refused remdesivir and other specific COVID-19 therapies due to their side-effects. The patient eventually agreed to be treated with convalescent plasma.    The  patient received a chest CT during ED visit which showed no pulmonary central or segmental embolus. There was an interval increase in size of innumerable pulmonary metastases compared to PET-CT 03/08/2020.  The patient is still currently undergoing therapy with lurbinectedin.  Per Dr. Marin Olp she reports she will be changing things up by the apparent progression on scans.  On evaluation today the patient reports having no pain whatsoever.  His right leg weakness which is been chronic is stable no further progression.  He reports being fully recovered from his COVID-19 infection.  He reports staying in the hospital for 3 days concerning this issue.   Allergies:  is allergic to codeine and hydrocodone.  Meds: Current Outpatient Medications  Medication Sig Dispense Refill  . acetaminophen (TYLENOL) 500 MG tablet Take 1 tablet (500 mg total) by mouth every 4 (four) hours as needed for mild pain, headache or fever (fever >/= 101). 30 tablet 0  . ascorbic acid (VITAMIN C) 500 MG tablet Take 1 tablet (500 mg total) by mouth daily.    Marland Kitchen aspirin 81 MG tablet Take 81 mg by mouth daily.    Marland Kitchen dexamethasone (DECADRON) 4 MG tablet Take 4 mg by mouth daily. X 3 days    . ezetimibe (ZETIA) 10 MG tablet TAKE 1 TABLET BY MOUTH DAILY (Patient taking differently: Take 10 mg by mouth daily.) 90 tablet 3  . gabapentin (NEURONTIN) 300 MG capsule TAKE 1 CAPSULE BY MOUTH TWICE A DAY THEN TAKE 2 CAPSULES AT BEDTIME (Patient taking differently: Take 300-600 mg by mouth 2 (two) times daily.) 120 capsule 2  .  GENERLAC 10 GM/15ML SOLN Take 10 g by mouth daily as needed (constipation).    . isosorbide mononitrate (IMDUR) 30 MG 24 hr tablet TAKE 1 TABLET (30 MG TOTAL) BY MOUTH DAILY. (Patient taking differently: Take 30 mg by mouth daily.) 90 tablet 2  . OVER THE COUNTER MEDICATION every morning. Juice Plus -- 8 capsule of each the garden, vineyard, and orchard twice a day.    . pantoprazole (PROTONIX) 40 MG tablet TAKE 1  TABLET (40 MG TOTAL) BY MOUTH AT BEDTIME. (Patient taking differently: Take 40 mg by mouth at bedtime.) 30 tablet 6  . prochlorperazine (COMPAZINE) 10 MG tablet Take 1 tablet (10 mg total) by mouth every 6 (six) hours as needed for nausea or vomiting. 60 tablet 3  . Zinc 50 MG CAPS Take 50 mg by mouth daily.    Marland Kitchen guaiFENesin (MUCINEX) 600 MG 12 hr tablet Take 1 tablet (600 mg total) by mouth 2 (two) times daily for 14 days. (Patient not taking: No sig reported) 28 tablet 0  . predniSONE (DELTASONE) 10 MG tablet Take 5 tablets by mouth on day 1 then, 4 tablets on day 2, then 3 tabs on day 3, then 2 tabs on day 4, then 1 tab on day 5, then STOP. (Patient not taking: Reported on 06/09/2020) 15 tablet 0   No current facility-administered medications for this encounter.   Facility-Administered Medications Ordered in Other Encounters  Medication Dose Route Frequency Provider Last Rate Last Admin  . octreotide (SANDOSTATIN LAR) IM injection 30 mg  30 mg Intramuscular Once [name], MD        Physical Findings: The patient is in no acute distress. Patient is alert and oriented.  height is 5\' 8"  (1.727 m) and weight is 160 lb 9.6 oz (72.8 kg). His temperature is 97.8 F (36.6 C). His blood pressure is 134/74 and his pulse is 86. His respiration is 20 and oxygen saturation is 100%. .   Lungs are clear to auscultation bilaterally. Heart has regular rate and rhythm. No palpable cervical, supraclavicular, or axillary adenopathy. Abdomen soft, non-tender, normal bowel sounds.  The patient continues to wear a ankle brace along his right foot.  Motor strength appears to be unchanged lower extremities per my evaluation today.  Patient ambulates without the assistance of a cane or walker.   Lab Findings: Lab Results  Component Value Date   WBC 4.9 06/07/2020   HGB 8.9 (L) 06/07/2020   HCT 27.8 (L) 06/07/2020   MCV 104.1 (H) 06/07/2020   PLT 306 06/07/2020    Radiographic Findings: CT ANGIO CHEST  PE W OR WO CONTRAST  Result Date: 05/24/2020 CLINICAL DATA:  Cough, fever. COVID positive elevated D-dimer. Suspected pulmonary embolus. 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 EXAM: CT ANGIOGRAPHY CHEST WITH CONTRAST TECHNIQUE: Multidetector CT imaging of the chest was performed using the standard protocol during bolus administration of intravenous contrast. Multiplanar CT image reconstructions and MIPs were obtained to evaluate the vascular anatomy. CONTRAST:  64mL OMNIPAQUE IOHEXOL 350 MG/ML SOLN COMPARISON:  CT angio chest 03/23/2018. CT PET 03/08/2020 FINDINGS: Cardiovascular: Satisfactory opacification of the pulmonary arteries to the segmental level. No evidence of pulmonary embolism. Normal heart size. No significant pericardial effusion. The thoracic aorta is normal in caliber. Mild atherosclerotic plaque of the thoracic aorta. At least 2 vessel coronary artery calcifications. Mediastinum/Nodes: No enlarged mediastinal, hilar, or axillary lymph nodes. Thyroid gland, trachea, and esophagus demonstrate no significant findings. Lungs/Pleura: Interval  increase in size of innumerable known pulmonary testes with as an example a right lower lobe paramediastinal (6:97) 3 x 2.2 cm lesion from 2.5 x 1.9 cm. As another example a left base (6:97) 2.1 x 1.7 cm lesion (from 0.8 x 0.7 cm). At Upper Abdomen: No acute abnormality. Musculoskeletal: No abdominal wall hernia or abnormality. Redemonstration of sclerotic lesion of the posterior right seventh rib (5:123, 9:111). No new suspicious lytic or blastic osseous lesion. No acute displaced fracture. Multilevel degenerative changes of the spine. Review of the MIP images confirms the above findings. IMPRESSION: 1. No pulmonary central or segmental embolus. 2. Interval increase in size of innumerable pulmonary metastases compared to PET-CT 03/08/2020. 3.  Aortic Atherosclerosis (ICD10-I70.0). Electronically Signed   By: Iven Finn  M.D.   On: 05/24/2020 04:25   MR SACRUM SI JOINTS W WO CONTRAST  Result Date: 05/22/2020 CLINICAL DATA:  Metastatic neuroendocrine tumor to the right sacrum. Post radiation therapy. Assess treatment response. EXAM: MRI PELVIS WITHOUT AND WITH CONTRAST TECHNIQUE: Multiplanar multisequence MR imaging of the pelvis was performed both before and after administration of intravenous contrast. CONTRAST:  7.22mL GADAVIST GADOBUTROL 1 MMOL/ML IV SOLN COMPARISON:  MR lumbar spine 03/19/2020, PET-CT 03/08/2020 and pelvic MRI 07/28/2016. FINDINGS: Bones/Joint/Cartilage The patient has a chronic mixed sclerotic and lytic lesion involving the right sacral ala which demonstrates low T1 and high T2 signal. This lesion measures approximately 5.4 x 3.5 x 5.9 cm and does appear mildly progressive from the pelvic MRI of 2018. Following contrast, there is heterogeneous enhancement of the osseous lesion. In addition, there is heterogeneous epidural soft tissue extending along the right S1 and S2 nerve root sleeves. On the most recent PET-CT, activity associated with this lesion was primarily within this epidural component and not within the bone. There are probable new small enhancing lesions in the right iliac bone adjacent to the sacroiliac joint measuring 8 mm on image 22/9, a 10 mm left sacral lesion on image 20/9 and a 9 mm lesion in the lower sacrum (image 12/9). No evidence of pathologic fracture. The visualized hip joints appear unremarkable. Ligaments Not relevant for exam/indication. Muscles and Tendons The pelvic muscles appear unremarkable without abnormal enhancement or fluid collection. No focal muscular atrophy. Soft tissues Mild concentric rectal wall thickening, likely related to radiation therapy. No evidence of pelvic adenopathy or ascites. IMPRESSION: 1. The large lytic lesion involving the right sacral ala has enlarged from remote pelvic MRI, although appears similar in size to the more recent PET CTs which did not  show associated activity in the bone. The lesion does show some enhancement on MRI, which can be seen subsequent to radiation therapy. 2. Associated epidural enhancement along the right S1 and S2 nerve roots with radiotracer activity on recent PET-CT, likely representing viable tumor to some degree. 3. Additional enhancing osseous lesions in the right iliac bone and sacrum, suspicious for additional metastases, new from remote MRI. Electronically Signed   By: Richardean Sale M.D.   On: 05/22/2020 17:43   DG Chest Portable 1 View  Result Date: 05/23/2020 CLINICAL DATA:  Cough and fever for several days EXAM: PORTABLE CHEST 1 VIEW COMPARISON:  03/23/2018 FINDINGS: Cardiac shadow is stable. Right chest wall port is again seen and stable. Lungs are well aerated bilaterally. Patchy left basilar airspace opacity is noted which may represent early infiltrate. No bony abnormality is noted. IMPRESSION: Findings suspicious for early infiltrate in the left base. Electronically Signed   By: Linus Mako.D.  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  As above patient will proceed with additional options concerning the overall progression of his disease.  Plan: As needed follow-up in radiation oncology.  Patient will continue close follow-up with medical oncology as above.   ____________________________________  Blair Promise, PhD, MD   This document serves as a record of services personally performed by [name], MD. It was created on his behalf by Roney Mans, a trained medical scribe. The creation of this record is based on the scribe's personal observations and the provider's statements to them. This document has been checked and approved by the attending provider.

## 2020-06-08 NOTE — Telephone Encounter (Signed)
Appts made and pt called with appts per 06/07/20 los   [name]

## 2020-06-08 NOTE — Progress Notes (Signed)
DISCONTINUE OFF PATHWAY REGIMEN - Small Cell Lung   [id number]:Lurbinectedin 3.2 mg/m2 IV D1 q21 Days:   A cycle is every 21 days:     Lurbinectedin   **Always confirm dose/schedule in your pharmacy ordering system**  REASON: Disease Progression PRIOR TREATMENT: Off Pathway: Lurbinectedin 3.2 mg/m2 IV D1 q21 Days TREATMENT RESPONSE: Partial Response (PR)  START OFF PATHWAY REGIMEN - Small Cell Lung   [id number]:Cisplatin + Irinotecan:   A cycle is every 21 days:     Irinotecan      Cisplatin   **Always confirm dose/schedule in your pharmacy ordering system**  Patient Characteristics: Relapsed or Progressive Disease, Third Line and Beyond Therapeutic Status: Relapsed or Progressive Disease Line of Therapy: Third Line and Beyond  Intent of Therapy: Non-Curative / Palliative Intent, Discussed with Patient

## 2020-06-09 ENCOUNTER — Other Ambulatory Visit: Payer: Self-pay

## 2020-06-09 ENCOUNTER — Encounter: Payer: Self-pay | Admitting: Radiation Oncology

## 2020-06-09 ENCOUNTER — Ambulatory Visit
Admission: RE | Admit: 2020-06-09 | Discharge: 2020-06-09 | Disposition: A | Discharge status: Home / Self Care | Payer: Medicare Other | Source: Ambulatory Visit | Attending: Radiation Oncology | Admitting: Radiation Oncology

## 2020-06-09 DIAGNOSIS — Z7982 Long term (current) use of aspirin: Secondary | ICD-10-CM | POA: Insufficient documentation

## 2020-06-09 DIAGNOSIS — Z79899 Other long term (current) drug therapy: Secondary | ICD-10-CM | POA: Diagnosis not present

## 2020-06-09 DIAGNOSIS — C801 Malignant (primary) neoplasm, unspecified: Secondary | ICD-10-CM | POA: Insufficient documentation

## 2020-06-09 DIAGNOSIS — Z191 Hormone sensitive malignancy status: Secondary | ICD-10-CM | POA: Insufficient documentation

## 2020-06-09 DIAGNOSIS — C61 Malignant neoplasm of prostate: Secondary | ICD-10-CM | POA: Insufficient documentation

## 2020-06-09 DIAGNOSIS — Z923 Personal history of irradiation: Secondary | ICD-10-CM | POA: Diagnosis not present

## 2020-06-09 DIAGNOSIS — C7800 Secondary malignant neoplasm of unspecified lung: Secondary | ICD-10-CM | POA: Insufficient documentation

## 2020-06-09 DIAGNOSIS — Z8616 Personal history of COVID-19: Secondary | ICD-10-CM | POA: Diagnosis not present

## 2020-06-09 DIAGNOSIS — C7951 Secondary malignant neoplasm of bone: Secondary | ICD-10-CM | POA: Diagnosis not present

## 2020-06-09 NOTE — Progress Notes (Signed)
[name] is here today for follow up post radiation to the pelvic.  They completed their radiation on: 04/27/20  Does the patient complain of any of the following:  . Pain:Patient denies any pain . Abdominal bloating: no . Diarrhea/Constipation: no . Nausea/Vomiting: no . Blood in Urine or Stool: no . Urinary Issues (dysuria/incomplete emptying/ incontinence/ increased frequency/urgency): Patient reports having urinary frequency . Post radiation skin changes: no   Additional comments if applicable:   Vitals:   06/09/20 0932  BP: 134/74  Pulse: 86  Resp: 20  Temp: 97.8 F (36.6 C)  SpO2: 100%  Weight: 160 lb 9.6 oz (72.8 kg)  Height: 5\' 8"  (1.727 m)

## 2020-06-10 DIAGNOSIS — M9902 Segmental and somatic dysfunction of thoracic region: Secondary | ICD-10-CM | POA: Diagnosis not present

## 2020-06-10 DIAGNOSIS — M9903 Segmental and somatic dysfunction of lumbar region: Secondary | ICD-10-CM | POA: Diagnosis not present

## 2020-06-10 DIAGNOSIS — M9901 Segmental and somatic dysfunction of cervical region: Secondary | ICD-10-CM | POA: Diagnosis not present

## 2020-06-10 DIAGNOSIS — M9904 Segmental and somatic dysfunction of sacral region: Secondary | ICD-10-CM | POA: Diagnosis not present

## 2020-06-14 DIAGNOSIS — M9903 Segmental and somatic dysfunction of lumbar region: Secondary | ICD-10-CM | POA: Diagnosis not present

## 2020-06-14 DIAGNOSIS — M9901 Segmental and somatic dysfunction of cervical region: Secondary | ICD-10-CM | POA: Diagnosis not present

## 2020-06-14 DIAGNOSIS — M9902 Segmental and somatic dysfunction of thoracic region: Secondary | ICD-10-CM | POA: Diagnosis not present

## 2020-06-14 DIAGNOSIS — M9904 Segmental and somatic dysfunction of sacral region: Secondary | ICD-10-CM | POA: Diagnosis not present

## 2020-06-15 NOTE — Progress Notes (Incomplete)
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 Patient reports having poor sleep due to steroids which is increasing his tiredness, as well as having some loose stools. He was encouraged to increase his fluid intake.   Patient reports having a healthy appetite and some urinary frequency with a normal steady stream. He denied any dysuria, hematuria, skin irritation, or radiating right leg pain.   Plan: The patient will follow-up with radiation oncology in one month .  ________________________________________________ -----------------------------------  Blair Promise, PhD, MD  This document serves as a record of services personally performed by [name], MD. It was created on his behalf by Roney Mans, a trained medical scribe. The creation of this record is based on the scribe's personal observations and the provider's statements to them. This document has been checked and approved by the attending provider.

## 2020-06-16 DIAGNOSIS — M9904 Segmental and somatic dysfunction of sacral region: Secondary | ICD-10-CM | POA: Diagnosis not present

## 2020-06-16 DIAGNOSIS — M9903 Segmental and somatic dysfunction of lumbar region: Secondary | ICD-10-CM | POA: Diagnosis not present

## 2020-06-16 DIAGNOSIS — M9902 Segmental and somatic dysfunction of thoracic region: Secondary | ICD-10-CM | POA: Diagnosis not present

## 2020-06-16 DIAGNOSIS — M9901 Segmental and somatic dysfunction of cervical region: Secondary | ICD-10-CM | POA: Diagnosis not present

## 2020-06-17 ENCOUNTER — Other Ambulatory Visit: Payer: Self-pay | Admitting: *Deleted

## 2020-06-17 DIAGNOSIS — C7951 Secondary malignant neoplasm of bone: Secondary | ICD-10-CM

## 2020-06-17 DIAGNOSIS — C7A8 Other malignant neuroendocrine tumors: Secondary | ICD-10-CM

## 2020-06-17 DIAGNOSIS — Z95828 Presence of other vascular implants and grafts: Secondary | ICD-10-CM

## 2020-06-20 ENCOUNTER — Inpatient Hospital Stay: Payer: Medicare Other

## 2020-06-20 ENCOUNTER — Other Ambulatory Visit (HOSPITAL_COMMUNITY): Payer: Self-pay | Admitting: Hematology & Oncology

## 2020-06-20 ENCOUNTER — Other Ambulatory Visit: Payer: Self-pay

## 2020-06-20 ENCOUNTER — Inpatient Hospital Stay: Payer: Medicare Other | Attending: Hematology & Oncology

## 2020-06-20 ENCOUNTER — Encounter: Payer: Self-pay | Admitting: Hematology & Oncology

## 2020-06-20 ENCOUNTER — Other Ambulatory Visit (HOSPITAL_BASED_OUTPATIENT_CLINIC_OR_DEPARTMENT_OTHER): Payer: Self-pay

## 2020-06-20 VITALS — BP 118/69 | HR 96 | Temp 98.5°F | Resp 18

## 2020-06-20 DIAGNOSIS — Z5111 Encounter for antineoplastic chemotherapy: Secondary | ICD-10-CM | POA: Diagnosis not present

## 2020-06-20 DIAGNOSIS — C61 Malignant neoplasm of prostate: Secondary | ICD-10-CM | POA: Diagnosis not present

## 2020-06-20 DIAGNOSIS — K909 Intestinal malabsorption, unspecified: Secondary | ICD-10-CM | POA: Diagnosis not present

## 2020-06-20 DIAGNOSIS — C7A8 Other malignant neuroendocrine tumors: Secondary | ICD-10-CM

## 2020-06-20 DIAGNOSIS — M2550 Pain in unspecified joint: Secondary | ICD-10-CM | POA: Diagnosis not present

## 2020-06-20 DIAGNOSIS — D508 Other iron deficiency anemias: Secondary | ICD-10-CM | POA: Insufficient documentation

## 2020-06-20 DIAGNOSIS — C7B8 Other secondary neuroendocrine tumors: Secondary | ICD-10-CM | POA: Insufficient documentation

## 2020-06-20 DIAGNOSIS — C7951 Secondary malignant neoplasm of bone: Secondary | ICD-10-CM

## 2020-06-20 DIAGNOSIS — R531 Weakness: Secondary | ICD-10-CM | POA: Diagnosis not present

## 2020-06-20 DIAGNOSIS — Z79899 Other long term (current) drug therapy: Secondary | ICD-10-CM | POA: Insufficient documentation

## 2020-06-20 DIAGNOSIS — Z95828 Presence of other vascular implants and grafts: Secondary | ICD-10-CM

## 2020-06-20 LAB — CBC WITH DIFFERENTIAL (CANCER CENTER ONLY)
Abs Immature Granulocytes: 0.03 10*3/uL (ref 0.00–0.07)
Basophils Absolute: 0 10*3/uL (ref 0.0–0.1)
Basophils Relative: 1 %
Eosinophils Absolute: 0.5 10*3/uL (ref 0.0–0.5)
Eosinophils Relative: 7 %
HCT: 29.8 % — ABNORMAL LOW (ref 39.0–52.0)
Hemoglobin: 9.5 g/dL — ABNORMAL LOW (ref 13.0–17.0)
Immature Granulocytes: 1 %
Lymphocytes Relative: 14 %
Lymphs Abs: 0.9 10*3/uL (ref 0.7–4.0)
MCH: 33.5 pg (ref 26.0–34.0)
MCHC: 31.9 g/dL (ref 30.0–36.0)
MCV: 104.9 fL — ABNORMAL HIGH (ref 80.0–100.0)
Monocytes Absolute: 0.7 10*3/uL (ref 0.1–1.0)
Monocytes Relative: 12 %
Neutro Abs: 4.3 10*3/uL (ref 1.7–7.7)
Neutrophils Relative %: 65 %
Platelet Count: 308 10*3/uL (ref 150–400)
RBC: 2.84 MIL/uL — ABNORMAL LOW (ref 4.22–5.81)
RDW: 15.9 % — ABNORMAL HIGH (ref 11.5–15.5)
WBC Count: 6.4 10*3/uL (ref 4.0–10.5)
nRBC: 0 % (ref 0.0–0.2)

## 2020-06-20 LAB — CMP (CANCER CENTER ONLY)
ALT: 15 U/L (ref 0–44)
AST: 15 U/L (ref 15–41)
Albumin: 4 g/dL (ref 3.5–5.0)
Alkaline Phosphatase: 62 U/L (ref 38–126)
Anion gap: 11 (ref 5–15)
BUN: 22 mg/dL (ref 8–23)
CO2: 24 mmol/L (ref 22–32)
Calcium: 9.8 mg/dL (ref 8.9–10.3)
Chloride: 104 mmol/L (ref 98–111)
Creatinine: 1.39 mg/dL — ABNORMAL HIGH (ref 0.61–1.24)
GFR, Estimated: 54 mL/min — ABNORMAL LOW (ref >60)
Glucose, Bld: 198 mg/dL — ABNORMAL HIGH (ref 70–99)
Potassium: 3.8 mmol/L (ref 3.5–5.1)
Sodium: 139 mmol/L (ref 135–145)
Total Bilirubin: 0.3 mg/dL (ref 0.3–1.2)
Total Protein: 6.3 g/dL — ABNORMAL LOW (ref 6.5–8.1)

## 2020-06-20 MED ORDER — ONDANSETRON HCL 8 MG PO TABS
8.0000 mg | ORAL_TABLET | Freq: Two times a day (BID) | ORAL | 1 refills | Status: AC | PRN
  Filled 2020-06-20: qty 30, 15d supply, fill #0

## 2020-06-20 MED ORDER — PEGFILGRASTIM-JMDB 6 MG/0.6ML ~~LOC~~ SOSY
PREFILLED_SYRINGE | SUBCUTANEOUS | Status: AC
  Filled 2020-06-20: qty 0.6

## 2020-06-20 MED ORDER — PALONOSETRON HCL INJECTION 0.25 MG/5ML
0.2500 mg | Freq: Once | INTRAVENOUS | Status: AC
  Administered 2020-06-20: 0.25 mg via INTRAVENOUS

## 2020-06-20 MED ORDER — DEXAMETHASONE 4 MG PO TABS
8.0000 mg | ORAL_TABLET | Freq: Every day | ORAL | 1 refills | Status: DC
  Filled 2020-06-20 – 2020-09-20 (×2): qty 30, 15d supply, fill #0

## 2020-06-20 MED ORDER — PALONOSETRON HCL INJECTION 0.25 MG/5ML
INTRAVENOUS | Status: AC
  Filled 2020-06-20: qty 5

## 2020-06-20 MED ORDER — MAGNESIUM SULFATE 2 GM/50ML IV SOLN
2.0000 g | Freq: Once | INTRAVENOUS | Status: AC
  Administered 2020-06-20: 2 g via INTRAVENOUS
  Filled 2020-06-20: qty 50

## 2020-06-20 MED ORDER — DENOSUMAB 60 MG/ML ~~LOC~~ SOSY
PREFILLED_SYRINGE | SUBCUTANEOUS | Status: AC
  Filled 2020-06-20: qty 1

## 2020-06-20 MED ORDER — LORAZEPAM 0.5 MG PO TABS: 0.5000 mg | ORAL_TABLET | Freq: Four times a day (QID) | ORAL | 0 refills | Status: DC | PRN

## 2020-06-20 MED ORDER — SODIUM CHLORIDE 0.9 % IV SOLN
150.0000 mg | Freq: Once | INTRAVENOUS | Status: AC
  Administered 2020-06-20: 150 mg via INTRAVENOUS
  Filled 2020-06-20: qty 150

## 2020-06-20 MED ORDER — SODIUM CHLORIDE 0.9 % IV SOLN
10.0000 mg | Freq: Once | INTRAVENOUS | Status: AC
  Administered 2020-06-20: 10 mg via INTRAVENOUS
  Filled 2020-06-20: qty 10

## 2020-06-20 MED ORDER — LOPERAMIDE HCL 2 MG PO TABS
ORAL_TABLET | ORAL | 1 refills | Status: AC
  Filled 2020-06-20: qty 96, 8d supply, fill #0

## 2020-06-20 MED ORDER — SODIUM CHLORIDE 0.9% FLUSH
10.0000 mL | INTRAVENOUS | Status: DC | PRN
  Administered 2020-06-20: 10 mL
  Filled 2020-06-20: qty 10

## 2020-06-20 MED ORDER — DIPHENHYDRAMINE HCL 25 MG PO CAPS
ORAL_CAPSULE | ORAL | Status: AC
  Filled 2020-06-20: qty 1

## 2020-06-20 MED ORDER — POTASSIUM CHLORIDE IN NACL 20-0.9 MEQ/L-% IV SOLN
Freq: Once | INTRAVENOUS | Status: AC
  Filled 2020-06-20: qty 1000

## 2020-06-20 MED ORDER — ACETAMINOPHEN 325 MG PO TABS
ORAL_TABLET | ORAL | Status: AC
  Filled 2020-06-20: qty 2

## 2020-06-20 MED ORDER — SODIUM CHLORIDE 0.9 % IV SOLN
65.0000 mg/m2 | Freq: Once | INTRAVENOUS | Status: AC
  Administered 2020-06-20: 120 mg via INTRAVENOUS
  Filled 2020-06-20: qty 5

## 2020-06-20 MED ORDER — PROCHLORPERAZINE MALEATE 10 MG PO TABS
10.0000 mg | ORAL_TABLET | Freq: Four times a day (QID) | ORAL | 1 refills | Status: DC | PRN
  Filled 2020-06-20: qty 30, 8d supply, fill #0

## 2020-06-20 MED ORDER — SODIUM CHLORIDE 0.9 % IV SOLN
30.0000 mg/m2 | Freq: Once | INTRAVENOUS | Status: AC
  Administered 2020-06-20: 55 mg via INTRAVENOUS
  Filled 2020-06-20: qty 45

## 2020-06-20 MED ORDER — LANREOTIDE ACETATE 120 MG/0.5ML ~~LOC~~ SOLN
SUBCUTANEOUS | Status: AC
  Filled 2020-06-20: qty 120

## 2020-06-20 MED ORDER — SODIUM CHLORIDE 0.9 % IV SOLN: Freq: Once | INTRAVENOUS | Status: DC

## 2020-06-20 MED ORDER — HEPARIN SOD (PORK) LOCK FLUSH 100 UNIT/ML IV SOLN
500.0000 [IU] | Freq: Once | INTRAVENOUS | Status: AC | PRN
  Administered 2020-06-20: 500 [IU]
  Filled 2020-06-20: qty 5

## 2020-06-20 MED ORDER — SODIUM CHLORIDE 0.9 % IV SOLN
Freq: Once | INTRAVENOUS | Status: AC
Start: 2020-06-20 — End: 2020-06-20
  Filled 2020-06-20: qty 250

## 2020-06-20 MED ORDER — GENERLAC 10 GM/15ML PO SOLN
10.0000 g | Freq: Every day | ORAL | 6 refills | Status: AC | PRN
  Filled 2020-06-20: qty 946, 63d supply, fill #0

## 2020-06-20 MED ORDER — LIDOCAINE-PRILOCAINE 2.5-2.5 % EX CREA
TOPICAL_CREAM | CUTANEOUS | 3 refills | Status: AC
  Filled 2020-06-20: qty 30, 30d supply, fill #0

## 2020-06-20 MED FILL — Isosorbide Mononitrate Tab ER 24HR 30 MG: ORAL | 90 days supply | Qty: 90 | Fill #0 | Status: AC

## 2020-06-20 NOTE — Patient Instructions (Signed)
Irinotecan injection What is this medicine? IRINOTECAN (ir in oh TEE kan ) is a chemotherapy drug. It is used to treat colon and rectal cancer. This medicine may be used for other purposes; ask your health care provider or pharmacist if you have questions. COMMON BRAND NAME(S): Camptosar What should I tell my health care provider before I take this medicine? They need to know if you have any of these conditions:  dehydration  diarrhea  infection (especially a virus infection such as chickenpox, cold sores, or herpes)  liver disease  low blood counts, like low white cell, platelet, or red cell counts  low levels of calcium, magnesium, or potassium in the blood  recent or ongoing radiation therapy  an unusual or allergic reaction to irinotecan, other medicines, foods, dyes, or preservatives  pregnant or trying to get pregnant  breast-feeding How should I use this medicine? This drug is given as an infusion into a vein. It is administered in a hospital or clinic by a specially trained health care professional. Talk to your pediatrician regarding the use of this medicine in children. Special care may be needed. Overdosage: If you think you have taken too much of this medicine contact a poison control center or emergency room at once. NOTE: This medicine is only for you. Do not share this medicine with others. What if I miss a dose? It is important not to miss your dose. Call your doctor or health care professional if you are unable to keep an appointment. What may interact with this medicine? Do not take this medicine with any of the following medications:  cobicistat  itraconazole This medicine may interact with the following medications:  antiviral medicines for HIV or AIDS  certain antibiotics like rifampin or rifabutin  certain medicines for fungal infections like ketoconazole, posaconazole, and voriconazole  certain medicines for seizures like carbamazepine,  phenobarbital, phenotoin  clarithromycin  gemfibrozil  nefazodone  St. John's Wort This list may not describe all possible interactions. Give your health care provider a list of all the medicines, herbs, non-prescription drugs, or dietary supplements you use. Also tell them if you smoke, drink alcohol, or use illegal drugs. Some items may interact with your medicine. What should I watch for while using this medicine? Your condition will be monitored carefully while you are receiving this medicine. You will need important blood work done while you are taking this medicine. This drug may make you feel generally unwell. This is not uncommon, as chemotherapy can affect healthy cells as well as cancer cells. Report any side effects. Continue your course of treatment even though you feel ill unless your doctor tells you to stop. In some cases, you may be given additional medicines to help with side effects. Follow all directions for their use. You may get drowsy or dizzy. Do not drive, use machinery, or do anything that needs mental alertness until you know how this medicine affects you. Do not stand or sit up quickly, especially if you are an older patient. This reduces the risk of dizzy or fainting spells. Call your health care professional for advice if you get a fever, chills, or sore throat, or other symptoms of a cold or flu. Do not treat yourself. This medicine decreases your body's ability to fight infections. Try to avoid being around people who are sick. Avoid taking products that contain aspirin, acetaminophen, ibuprofen, naproxen, or ketoprofen unless instructed by your doctor. These medicines may hide a fever. This medicine may increase your risk  to bruise or bleed. Call your doctor or health care professional if you notice any unusual bleeding. Be careful brushing and flossing your teeth or using a toothpick because you may get an infection or bleed more easily. If you have any dental work  done, tell your dentist you are receiving this medicine. Do not become pregnant while taking this medicine or for 6 months after stopping it. Women should inform their health care professional if they wish to become pregnant or think they might be pregnant. Men should not father a child while taking this medicine and for 3 months after stopping it. There is potential for serious side effects to an unborn child. Talk to your health care professional for more information. Do not breast-feed an infant while taking this medicine or for 7 days after stopping it. This medicine has caused ovarian failure in some women. This medicine may make it more difficult to get pregnant. Talk to your health care professional if you are concerned about your fertility. This medicine has caused decreased sperm counts in some men. This may make it more difficult to father a child. Talk to your health care professional if you are concerned about your fertility. What side effects may I notice from receiving this medicine? Side effects that you should report to your doctor or health care professional as soon as possible:  allergic reactions like skin rash, itching or hives, swelling of the face, lips, or tongue  chest pain  diarrhea  flushing, runny nose, sweating during infusion  low blood counts - this medicine may decrease the number of white blood cells, red blood cells and platelets. You may be at increased risk for infections and bleeding.  nausea, vomiting  pain, swelling, warmth in the leg  signs of decreased platelets or bleeding - bruising, pinpoint red spots on the skin, black, tarry stools, blood in the urine  signs of infection - fever or chills, cough, sore throat, pain or difficulty passing urine  signs of decreased red blood cells - unusually weak or tired, fainting spells, lightheadedness Side effects that usually do not require medical attention (report to your doctor or health care professional  if they continue or are bothersome):  constipation  hair loss  headache  loss of appetite  mouth sores  stomach pain This list may not describe all possible side effects. Call your doctor for medical advice about side effects. You may report side effects to FDA at 1-800-FDA-1088. Where should I keep my medicine? This drug is given in a hospital or clinic and will not be stored at home. NOTE: This sheet is a summary. It may not cover all possible information. If you have questions about this medicine, talk to your doctor, pharmacist, or health care provider.  2021 Elsevier/Gold Standard (2018-12-02 17:46:13) Cisplatin injection What is this medicine? CISPLATIN (SIS pla tin) is a chemotherapy drug. It targets fast dividing cells, like cancer cells, and causes these cells to die. This medicine is used to treat many types of cancer like bladder, ovarian, and testicular cancers. This medicine may be used for other purposes; ask your health care provider or pharmacist if you have questions. COMMON BRAND NAME(S): Platinol, Platinol -AQ What should I tell my health care provider before I take this medicine? They need to know if you have any of these conditions:  eye disease, vision problems  hearing problems  kidney disease  low blood counts, like white cells, platelets, or red blood cells  tingling of the fingers  or toes, or other nerve disorder  an unusual or allergic reaction to cisplatin, carboplatin, oxaliplatin, other medicines, foods, dyes, or preservatives  pregnant or trying to get pregnant  breast-feeding How should I use this medicine? This drug is given as an infusion into a vein. It is administered in a hospital or clinic by a specially trained health care professional. Talk to your pediatrician regarding the use of this medicine in children. Special care may be needed. Overdosage: If you think you have taken too much of this medicine contact a poison control center  or emergency room at once. NOTE: This medicine is only for you. Do not share this medicine with others. What if I miss a dose? It is important not to miss a dose. Call your doctor or health care professional if you are unable to keep an appointment. What may interact with this medicine? This medicine may interact with the following medications:  foscarnet  certain antibiotics like amikacin, gentamicin, neomycin, polymyxin B, streptomycin, tobramycin, vancomycin This list may not describe all possible interactions. Give your health care provider a list of all the medicines, herbs, non-prescription drugs, or dietary supplements you use. Also tell them if you smoke, drink alcohol, or use illegal drugs. Some items may interact with your medicine. What should I watch for while using this medicine? Your condition will be monitored carefully while you are receiving this medicine. You will need important blood work done while you are taking this medicine. This drug may make you feel generally unwell. This is not uncommon, as chemotherapy can affect healthy cells as well as cancer cells. Report any side effects. Continue your course of treatment even though you feel ill unless your doctor tells you to stop. This medicine may increase your risk of getting an infection. Call your healthcare professional for advice if you get a fever, chills, or sore throat, or other symptoms of a cold or flu. Do not treat yourself. Try to avoid being around people who are sick. Avoid taking medicines that contain aspirin, acetaminophen, ibuprofen, naproxen, or ketoprofen unless instructed by your healthcare professional. These medicines may hide a fever. This medicine may increase your risk to bruise or bleed. Call your doctor or health care professional if you notice any unusual bleeding. Be careful brushing and flossing your teeth or using a toothpick because you may get an infection or bleed more easily. If you have any  dental work done, tell your dentist you are receiving this medicine. Do not become pregnant while taking this medicine or for 14 months after stopping it. Women should inform their healthcare professional if they wish to become pregnant or think they might be pregnant. Men should not father a child while taking this medicine and for 11 months after stopping it. There is potential for serious side effects to an unborn child. Talk to your healthcare professional for more information. Do not breast-feed an infant while taking this medicine. This medicine has caused ovarian failure in some women. This medicine may make it more difficult to get pregnant. Talk to your healthcare professional if you are concerned about your fertility. This medicine has caused decreased sperm counts in some men. This may make it more difficult to father a child. Talk to your healthcare professional if you are concerned about your fertility. Drink fluids as directed while you are taking this medicine. This will help protect your kidneys. Call your doctor or health care professional if you get diarrhea. Do not treat yourself. What side  effects may I notice from receiving this medicine? Side effects that you should report to your doctor or health care professional as soon as possible:  allergic reactions like skin rash, itching or hives, swelling of the face, lips, or tongue  blurred vision  changes in vision  decreased hearing or ringing of the ears  nausea, vomiting  pain, redness, or irritation at site where injected  pain, tingling, numbness in the hands or feet  signs and symptoms of bleeding such as bloody or black, tarry stools; red or dark brown urine; spitting up blood or brown material that looks like coffee grounds; red spots on the skin; unusual bruising or bleeding from the eyes, gums, or nose  signs and symptoms of infection like fever; chills; cough; sore throat; pain or trouble passing urine  signs  and symptoms of kidney injury like trouble passing urine or change in the amount of urine  signs and symptoms of low red blood cells or anemia such as unusually weak or tired; feeling faint or lightheaded; falls; breathing problems Side effects that usually do not require medical attention (report to your doctor or health care professional if they continue or are bothersome):  loss of appetite  mouth sores  muscle cramps This list may not describe all possible side effects. Call your doctor for medical advice about side effects. You may report side effects to FDA at 1-800-FDA-1088. Where should I keep my medicine? This drug is given in a hospital or clinic and will not be stored at home. NOTE: This sheet is a summary. It may not cover all possible information. If you have questions about this medicine, talk to your doctor, pharmacist, or health care provider.  2021 Elsevier/Gold Standard (2017-12-27 15:59:17)

## 2020-06-20 NOTE — Patient Instructions (Signed)
Implanted Port Insertion, Care After This sheet gives you information about how to care for yourself after your procedure. Your health care provider may also give you more specific instructions. If you have problems or questions, contact your health care provider. What can I expect after the procedure? After the procedure, it is common to have:  Discomfort at the port insertion site.  Bruising on the skin over the port. This should improve over 3-4 days. Follow these instructions at home: Port care  After your port is placed, you will get a manufacturer's information card. The card has information about your port. Keep this card with you at all times.  Take care of the port as told by your health care provider. Ask your health care provider if you or a family member can get training for taking care of the port at home. A home health care nurse may also take care of the port.  Make sure to remember what type of port you have. Incision care  Follow instructions from your health care provider about how to take care of your port insertion site. Make sure you: ? Wash your hands with soap and water before and after you change your bandage (dressing). If soap and water are not available, use hand sanitizer. ? Change your dressing as told by your health care provider. ? Leave stitches (sutures), skin glue, or adhesive strips in place. These skin closures may need to stay in place for 2 weeks or longer. If adhesive strip edges start to loosen and curl up, you may trim the loose edges. Do not remove adhesive strips completely unless your health care provider tells you to do that.  Check your port insertion site every day for signs of infection. Check for: ? Redness, swelling, or pain. ? Fluid or blood. ? Warmth. ? Pus or a bad smell.      Activity  Return to your normal activities as told by your health care provider. Ask your health care provider what activities are safe for you.  Do not  lift anything that is heavier than 10 lb (4.5 kg), or the limit that you are told, until your health care provider says that it is safe. General instructions  Take over-the-counter and prescription medicines only as told by your health care provider.  Do not take baths, swim, or use a hot tub until your health care provider approves. Ask your health care provider if you may take showers. You may only be allowed to take sponge baths.  Do not drive for 24 hours if you were given a sedative during your procedure.  Wear a medical alert bracelet in case of an emergency. This will tell any health care providers that you have a port.  Keep all follow-up visits as told by your health care provider. This is important. Contact a health care provider if:  You cannot flush your port with saline as directed, or you cannot draw blood from the port.  You have a fever or chills.  You have redness, swelling, or pain around your port insertion site.  You have fluid or blood coming from your port insertion site.  Your port insertion site feels warm to the touch.  You have pus or a bad smell coming from the port insertion site. Get help right away if:  You have chest pain or shortness of breath.  You have bleeding from your port that you cannot control. Summary  Take care of the port as told by your   health care provider. Keep the manufacturer's information card with you at all times.  Change your dressing as told by your health care provider.  Contact a health care provider if you have a fever or chills or if you have redness, swelling, or pain around your port insertion site.  Keep all follow-up visits as told by your health care provider. This information is not intended to replace advice given to you by your health care provider. Make sure you discuss any questions you have with your health care provider. Document Revised: 07/30/2017 Document Reviewed: 07/30/2017 Elsevier Patient Education   2021 [employer] Inc.  

## 2020-06-21 ENCOUNTER — Other Ambulatory Visit (HOSPITAL_BASED_OUTPATIENT_CLINIC_OR_DEPARTMENT_OTHER): Payer: Self-pay

## 2020-06-21 DIAGNOSIS — M9901 Segmental and somatic dysfunction of cervical region: Secondary | ICD-10-CM | POA: Diagnosis not present

## 2020-06-21 DIAGNOSIS — M9904 Segmental and somatic dysfunction of sacral region: Secondary | ICD-10-CM | POA: Diagnosis not present

## 2020-06-21 DIAGNOSIS — M9902 Segmental and somatic dysfunction of thoracic region: Secondary | ICD-10-CM | POA: Diagnosis not present

## 2020-06-21 DIAGNOSIS — M9903 Segmental and somatic dysfunction of lumbar region: Secondary | ICD-10-CM | POA: Diagnosis not present

## 2020-06-23 DIAGNOSIS — M9901 Segmental and somatic dysfunction of cervical region: Secondary | ICD-10-CM | POA: Diagnosis not present

## 2020-06-23 DIAGNOSIS — M9902 Segmental and somatic dysfunction of thoracic region: Secondary | ICD-10-CM | POA: Diagnosis not present

## 2020-06-23 DIAGNOSIS — M9904 Segmental and somatic dysfunction of sacral region: Secondary | ICD-10-CM | POA: Diagnosis not present

## 2020-06-23 DIAGNOSIS — M9903 Segmental and somatic dysfunction of lumbar region: Secondary | ICD-10-CM | POA: Diagnosis not present

## 2020-06-24 ENCOUNTER — Other Ambulatory Visit: Payer: Self-pay | Admitting: *Deleted

## 2020-06-24 DIAGNOSIS — C7A8 Other malignant neuroendocrine tumors: Secondary | ICD-10-CM

## 2020-06-27 ENCOUNTER — Other Ambulatory Visit: Payer: Self-pay

## 2020-06-27 ENCOUNTER — Other Ambulatory Visit (HOSPITAL_BASED_OUTPATIENT_CLINIC_OR_DEPARTMENT_OTHER): Payer: Self-pay

## 2020-06-27 ENCOUNTER — Inpatient Hospital Stay: Payer: Medicare Other

## 2020-06-27 VITALS — HR 95

## 2020-06-27 DIAGNOSIS — Z5111 Encounter for antineoplastic chemotherapy: Secondary | ICD-10-CM | POA: Diagnosis not present

## 2020-06-27 DIAGNOSIS — K909 Intestinal malabsorption, unspecified: Secondary | ICD-10-CM | POA: Diagnosis not present

## 2020-06-27 DIAGNOSIS — M2550 Pain in unspecified joint: Secondary | ICD-10-CM | POA: Diagnosis not present

## 2020-06-27 DIAGNOSIS — C7B8 Other secondary neuroendocrine tumors: Secondary | ICD-10-CM | POA: Diagnosis not present

## 2020-06-27 DIAGNOSIS — C7A8 Other malignant neuroendocrine tumors: Secondary | ICD-10-CM

## 2020-06-27 DIAGNOSIS — C61 Malignant neoplasm of prostate: Secondary | ICD-10-CM | POA: Diagnosis not present

## 2020-06-27 DIAGNOSIS — C7951 Secondary malignant neoplasm of bone: Secondary | ICD-10-CM

## 2020-06-27 DIAGNOSIS — D508 Other iron deficiency anemias: Secondary | ICD-10-CM | POA: Diagnosis not present

## 2020-06-27 LAB — COMPREHENSIVE METABOLIC PANEL
ALT: 17 U/L (ref 0–44)
AST: 13 U/L — ABNORMAL LOW (ref 15–41)
Albumin: 4 g/dL (ref 3.5–5.0)
Alkaline Phosphatase: 57 U/L (ref 38–126)
Anion gap: 11 (ref 5–15)
BUN: 29 mg/dL — ABNORMAL HIGH (ref 8–23)
CO2: 23 mmol/L (ref 22–32)
Calcium: 9.9 mg/dL (ref 8.9–10.3)
Chloride: 101 mmol/L (ref 98–111)
Creatinine, Ser: 1.42 mg/dL — ABNORMAL HIGH (ref 0.61–1.24)
GFR, Estimated: 53 mL/min — ABNORMAL LOW (ref >60)
Glucose, Bld: 156 mg/dL — ABNORMAL HIGH (ref 70–99)
Potassium: 4.1 mmol/L (ref 3.5–5.1)
Sodium: 135 mmol/L (ref 135–145)
Total Bilirubin: 0.4 mg/dL (ref 0.3–1.2)
Total Protein: 6.1 g/dL — ABNORMAL LOW (ref 6.5–8.1)

## 2020-06-27 LAB — CBC WITH DIFFERENTIAL (CANCER CENTER ONLY)
Abs Immature Granulocytes: 0.08 10*3/uL — ABNORMAL HIGH (ref 0.00–0.07)
Basophils Absolute: 0 10*3/uL (ref 0.0–0.1)
Basophils Relative: 0 %
Eosinophils Absolute: 0.2 10*3/uL (ref 0.0–0.5)
Eosinophils Relative: 4 %
HCT: 29.6 % — ABNORMAL LOW (ref 39.0–52.0)
Hemoglobin: 9.8 g/dL — ABNORMAL LOW (ref 13.0–17.0)
Immature Granulocytes: 1 %
Lymphocytes Relative: 13 %
Lymphs Abs: 0.8 10*3/uL (ref 0.7–4.0)
MCH: 33.4 pg (ref 26.0–34.0)
MCHC: 33.1 g/dL (ref 30.0–36.0)
MCV: 101 fL — ABNORMAL HIGH (ref 80.0–100.0)
Monocytes Absolute: 0.3 10*3/uL (ref 0.1–1.0)
Monocytes Relative: 5 %
Neutro Abs: 4.9 10*3/uL (ref 1.7–7.7)
Neutrophils Relative %: 77 %
Platelet Count: 301 10*3/uL (ref 150–400)
RBC: 2.93 MIL/uL — ABNORMAL LOW (ref 4.22–5.81)
RDW: 15.9 % — ABNORMAL HIGH (ref 11.5–15.5)
WBC Count: 6.4 10*3/uL (ref 4.0–10.5)
nRBC: 0 % (ref 0.0–0.2)

## 2020-06-27 LAB — MAGNESIUM: Magnesium: 1.9 mg/dL (ref 1.7–2.4)

## 2020-06-27 MED ORDER — SODIUM CHLORIDE 0.9 % IV SOLN
30.0000 mg/m2 | Freq: Once | INTRAVENOUS | Status: AC
  Administered 2020-06-27: 55 mg via INTRAVENOUS
  Filled 2020-06-27: qty 55

## 2020-06-27 MED ORDER — SODIUM CHLORIDE 0.9 % IV SOLN
10.0000 mg | Freq: Once | INTRAVENOUS | Status: AC
  Administered 2020-06-27: 10 mg via INTRAVENOUS
  Filled 2020-06-27: qty 10

## 2020-06-27 MED ORDER — SODIUM CHLORIDE 0.9 % IV SOLN
65.0000 mg/m2 | Freq: Once | INTRAVENOUS | Status: AC
  Administered 2020-06-27: 120 mg via INTRAVENOUS
  Filled 2020-06-27: qty 6

## 2020-06-27 MED ORDER — SODIUM CHLORIDE 0.9 % IV SOLN
Freq: Once | INTRAVENOUS | Status: AC
  Filled 2020-06-27: qty 250

## 2020-06-27 MED ORDER — ATROPINE SULFATE 1 MG/ML IJ SOLN: 0.5000 mg | Freq: Once | INTRAMUSCULAR | Status: DC | PRN

## 2020-06-27 MED ORDER — SODIUM CHLORIDE 0.9 % IV SOLN: Freq: Once | INTRAVENOUS | Status: DC

## 2020-06-27 MED ORDER — HEPARIN SOD (PORK) LOCK FLUSH 100 UNIT/ML IV SOLN
500.0000 [IU] | Freq: Once | INTRAVENOUS | Status: AC | PRN
  Administered 2020-06-27: 500 [IU]
  Filled 2020-06-27: qty 5

## 2020-06-27 MED ORDER — ATROPINE SULFATE 1 MG/ML IJ SOLN
INTRAMUSCULAR | Status: AC
  Filled 2020-06-27: qty 1

## 2020-06-27 MED ORDER — PALONOSETRON HCL INJECTION 0.25 MG/5ML
INTRAVENOUS | Status: AC
  Filled 2020-06-27: qty 5

## 2020-06-27 MED ORDER — PALONOSETRON HCL INJECTION 0.25 MG/5ML
0.2500 mg | Freq: Once | INTRAVENOUS | Status: AC
  Administered 2020-06-27: 0.25 mg via INTRAVENOUS

## 2020-06-27 MED ORDER — SODIUM CHLORIDE 0.9 % IV SOLN
150.0000 mg | Freq: Once | INTRAVENOUS | Status: AC
  Administered 2020-06-27: 150 mg via INTRAVENOUS
  Filled 2020-06-27: qty 150

## 2020-06-27 MED ORDER — SODIUM CHLORIDE 0.9% FLUSH
10.0000 mL | INTRAVENOUS | Status: DC | PRN
  Administered 2020-06-27: 10 mL
  Filled 2020-06-27: qty 10

## 2020-06-27 MED ORDER — MAGNESIUM SULFATE 2 GM/50ML IV SOLN
2.0000 g | Freq: Once | INTRAVENOUS | Status: AC
Start: 2020-06-27 — End: 2020-06-27
  Administered 2020-06-27: 2 g via INTRAVENOUS
  Filled 2020-06-27: qty 50

## 2020-06-27 MED ORDER — POTASSIUM CHLORIDE IN NACL 20-0.9 MEQ/L-% IV SOLN
Freq: Once | INTRAVENOUS | Status: AC
  Filled 2020-06-27: qty 1000

## 2020-06-27 NOTE — Patient Instructions (Signed)

## 2020-06-27 NOTE — Patient Instructions (Addendum)
[facility]  Discharge Instructions: Thank you for choosing [facility] to provide your oncology and hematology care.   If you have a lab appointment with the [facility], please go directly to the [facility] and check in at the registration area.  Wear comfortable clothing and clothing appropriate for easy access to any Portacath or PICC line.   We strive to give you quality time with your provider. You may need to reschedule your appointment if you arrive late (15 or more minutes).  Arriving late affects you and other patients whose appointments are after yours.  Also, if you miss three or more appointments without notifying the office, you may be dismissed from the clinic at the provider's discretion.      For prescription refill requests, have your pharmacy contact our office and allow 72 hours for refills to be completed.    Today you received the following chemotherapy and/or immunotherapy agents Cisplatin, Irinotecan       To help prevent nausea and vomiting after your treatment, we encourage you to take your nausea medication as directed.  BELOW ARE SYMPTOMS THAT SHOULD BE REPORTED IMMEDIATELY: *FEVER GREATER THAN 100.4 F (38 C) OR HIGHER *CHILLS OR SWEATING *NAUSEA AND VOMITING THAT IS NOT CONTROLLED WITH YOUR NAUSEA MEDICATION *UNUSUAL SHORTNESS OF BREATH *UNUSUAL BRUISING OR BLEEDING *URINARY PROBLEMS (pain or burning when urinating, or frequent urination) *BOWEL PROBLEMS (unusual diarrhea, constipation, pain near the anus) TENDERNESS IN MOUTH AND THROAT WITH OR WITHOUT PRESENCE OF ULCERS (sore throat, sores in mouth, or a toothache) UNUSUAL RASH, SWELLING OR PAIN  UNUSUAL VAGINAL DISCHARGE OR ITCHING   Items with * indicate a potential emergency and should be followed up as soon as possible or go to the Emergency Department if any problems should occur.  Please show the CHEMOTHERAPY ALERT CARD or IMMUNOTHERAPY ALERT CARD at  check-in to the Emergency Department and triage nurse. Should you have questions after your visit or need to cancel or reschedule your appointment, please contact [facility]  [phone] and follow the prompts.  Office hours are 8:00 a.m. to 4:30 p.m. Monday - Friday. Please note that voicemails left after 4:00 p.m. may not be returned until the following business day.  We are closed weekends and major holidays. You have access to a nurse at all times for urgent questions. Please call the main number to the clinic [phone] and follow the prompts.  For any non-urgent questions, you may also contact your provider using MyChart. We now offer e-Visits for anyone 60 and older to request care online for non-urgent symptoms. For details visit [email or web address].   Also download the MyChart app! Go to the app store, search "MyChart", open the app, select [facility], and log in with your MyChart username and password.  Due to Covid, a mask is required upon entering the hospital/clinic. If you do not have a mask, one will be given to you upon arrival. For doctor visits, patients may have 1 support person aged 47 or older with them. For treatment visits, patients cannot have anyone with them due to current Covid guidelines and our immunocompromised population. Irinotecan injection What is this medication? IRINOTECAN (ir in oh TEE kan ) is a chemotherapy drug. It is used to treatcolon and rectal cancer. This medicine may be used for other purposes; ask your health care provider orpharmacist if you have questions. COMMON BRAND NAME(S): Camptosar What should I tell my care  team before I take this medication? They need to know if you have any of these conditions: dehydration diarrhea infection (especially a virus infection such as chickenpox, cold sores, or herpes) liver disease low blood counts, like low white cell, platelet, or red cell counts low levels of calcium,  magnesium, or potassium in the blood recent or ongoing radiation therapy an unusual or allergic reaction to irinotecan, other medicines, foods, dyes, or preservatives pregnant or trying to get pregnant breast-feeding How should I use this medication? This drug is given as an infusion into a vein. It is administered in a hospitalor clinic by a specially trained health care professional. Talk to your pediatrician regarding the use of this medicine in children.Special care may be needed. Overdosage: If you think you have taken too much of this medicine contact apoison control center or emergency room at once. NOTE: This medicine is only for you. Do not share this medicine with others. What if I miss a dose? It is important not to miss your dose. Call your doctor or health careprofessional if you are unable to keep an appointment. What may interact with this medication? Do not take this medicine with any of the following medications: cobicistat itraconazole This medicine may interact with the following medications: antiviral medicines for HIV or AIDS certain antibiotics like rifampin or rifabutin certain medicines for fungal infections like ketoconazole, posaconazole, and voriconazole certain medicines for seizures like carbamazepine, phenobarbital, phenotoin clarithromycin gemfibrozil nefazodone St. John's Wort This list may not describe all possible interactions. Give your health care provider a list of all the medicines, herbs, non-prescription drugs, or dietary supplements you use. Also tell them if you smoke, drink alcohol, or use illegaldrugs. Some items may interact with your medicine. What should I watch for while using this medication? Your condition will be monitored carefully while you are receiving this medicine. You will need important blood work done while you are taking thismedicine. This drug may make you feel generally unwell. This is not uncommon, as chemotherapy can affect  healthy cells as well as cancer cells. Report any side effects. Continue your course of treatment even though you feel ill unless yourdoctor tells you to stop. In some cases, you may be given additional medicines to help with side effects.Follow all directions for their use. You may get drowsy or dizzy. Do not drive, use machinery, or do anything that needs mental alertness until you know how this medicine affects you. Do not stand or sit up quickly, especially if you are an older patient. This reducesthe risk of dizzy or fainting spells. Call your health care professional for advice if you get a fever, chills, or sore throat, or other symptoms of a cold or flu. Do not treat yourself. This medicine decreases your body's ability to fight infections. Try to avoid beingaround people who are sick. Avoid taking products that contain aspirin, acetaminophen, ibuprofen, naproxen, or ketoprofen unless instructed by your doctor. These medicines may hide afever. This medicine may increase your risk to bruise or bleed. Call your doctor orhealth care professional if you notice any unusual bleeding. Be careful brushing and flossing your teeth or using a toothpick because you may get an infection or bleed more easily. If you have any dental work done,tell your dentist you are receiving this medicine. Do not become pregnant while taking this medicine or for 6 months after stopping it. Women should inform their health care professional if they wish to become pregnant or think they might be pregnant. Men  should not father a child while taking this medicine and for 3 months after stopping it. There is potential for serious side effects to an unborn child. Talk to your health careprofessional for more information. Do not breast-feed an infant while taking this medicine or for 7 days afterstopping it. This medicine has caused ovarian failure in some women. This medicine may make it more difficult to get pregnant. Talk to your  health care professional if [place] Sellers concerned about your fertility. This medicine has caused decreased sperm counts in some men. This may make it more difficult to father a child. Talk to your health care professional if [place] Sellers concerned about your fertility. What side effects may I notice from receiving this medication? Side effects that you should report to your doctor or health care professionalas soon as possible: allergic reactions like skin rash, itching or hives, swelling of the face, lips, or tongue chest pain diarrhea flushing, runny nose, sweating during infusion low blood counts - this medicine may decrease the number of white blood cells, red blood cells and platelets. You may be at increased risk for infections and bleeding. nausea, vomiting pain, swelling, warmth in the leg signs of decreased platelets or bleeding - bruising, pinpoint red spots on the skin, black, tarry stools, blood in the urine signs of infection - fever or chills, cough, sore throat, pain or difficulty passing urine signs of decreased red blood cells - unusually weak or tired, fainting spells, lightheadedness Side effects that usually do not require medical attention (report to yourdoctor or health care professional if they continue or are bothersome): constipation hair loss headache loss of appetite mouth sores stomach pain This list may not describe all possible side effects. Call your doctor for medical advice about side effects. You may report side effects to FDA at1-800-FDA-1088. Where should I keep my medication? This drug is given in a hospital or clinic and will not be stored at home. NOTE: This sheet is a summary. It may not cover all possible information. If you have questions about this medicine, talk to your doctor, pharmacist, orhealth care provider.  2022 Elsevier/Gold Standard (2018-12-02 17:46:13)

## 2020-06-28 ENCOUNTER — Encounter: Payer: Self-pay | Admitting: Hematology & Oncology

## 2020-06-28 DIAGNOSIS — M9903 Segmental and somatic dysfunction of lumbar region: Secondary | ICD-10-CM | POA: Diagnosis not present

## 2020-06-28 DIAGNOSIS — M9904 Segmental and somatic dysfunction of sacral region: Secondary | ICD-10-CM | POA: Diagnosis not present

## 2020-06-28 DIAGNOSIS — M9902 Segmental and somatic dysfunction of thoracic region: Secondary | ICD-10-CM | POA: Diagnosis not present

## 2020-06-28 DIAGNOSIS — M9901 Segmental and somatic dysfunction of cervical region: Secondary | ICD-10-CM | POA: Diagnosis not present

## 2020-06-30 DIAGNOSIS — M9902 Segmental and somatic dysfunction of thoracic region: Secondary | ICD-10-CM | POA: Diagnosis not present

## 2020-06-30 DIAGNOSIS — M9901 Segmental and somatic dysfunction of cervical region: Secondary | ICD-10-CM | POA: Diagnosis not present

## 2020-06-30 DIAGNOSIS — M9904 Segmental and somatic dysfunction of sacral region: Secondary | ICD-10-CM | POA: Diagnosis not present

## 2020-06-30 DIAGNOSIS — M9903 Segmental and somatic dysfunction of lumbar region: Secondary | ICD-10-CM | POA: Diagnosis not present

## 2020-07-05 DIAGNOSIS — M9902 Segmental and somatic dysfunction of thoracic region: Secondary | ICD-10-CM | POA: Diagnosis not present

## 2020-07-05 DIAGNOSIS — M9904 Segmental and somatic dysfunction of sacral region: Secondary | ICD-10-CM | POA: Diagnosis not present

## 2020-07-05 DIAGNOSIS — M9903 Segmental and somatic dysfunction of lumbar region: Secondary | ICD-10-CM | POA: Diagnosis not present

## 2020-07-05 DIAGNOSIS — M9901 Segmental and somatic dysfunction of cervical region: Secondary | ICD-10-CM | POA: Diagnosis not present

## 2020-07-08 ENCOUNTER — Other Ambulatory Visit (HOSPITAL_BASED_OUTPATIENT_CLINIC_OR_DEPARTMENT_OTHER): Payer: Self-pay

## 2020-07-08 MED FILL — Pantoprazole Sodium EC Tab 40 MG (Base Equiv): ORAL | 30 days supply | Qty: 30 | Fill #2 | Status: AC

## 2020-07-11 ENCOUNTER — Inpatient Hospital Stay: Payer: Medicare Other

## 2020-07-11 ENCOUNTER — Inpatient Hospital Stay (HOSPITAL_BASED_OUTPATIENT_CLINIC_OR_DEPARTMENT_OTHER): Payer: Medicare Other | Admitting: Hematology & Oncology

## 2020-07-11 ENCOUNTER — Encounter: Payer: Self-pay | Admitting: Hematology & Oncology

## 2020-07-11 ENCOUNTER — Other Ambulatory Visit (HOSPITAL_BASED_OUTPATIENT_CLINIC_OR_DEPARTMENT_OTHER): Payer: Self-pay

## 2020-07-11 ENCOUNTER — Other Ambulatory Visit: Payer: Self-pay | Admitting: *Deleted

## 2020-07-11 ENCOUNTER — Other Ambulatory Visit: Payer: Self-pay

## 2020-07-11 VITALS — BP 140/70 | HR 103 | Temp 98.4°F | Resp 20 | Wt 149.0 lb

## 2020-07-11 VITALS — BP 140/70 | HR 103 | Temp 98.4°F | Resp 20 | Wt 149.2 lb

## 2020-07-11 DIAGNOSIS — C61 Malignant neoplasm of prostate: Secondary | ICD-10-CM | POA: Diagnosis not present

## 2020-07-11 DIAGNOSIS — C7A8 Other malignant neuroendocrine tumors: Secondary | ICD-10-CM

## 2020-07-11 DIAGNOSIS — Z5111 Encounter for antineoplastic chemotherapy: Secondary | ICD-10-CM | POA: Diagnosis not present

## 2020-07-11 DIAGNOSIS — C7B8 Other secondary neuroendocrine tumors: Secondary | ICD-10-CM

## 2020-07-11 DIAGNOSIS — D508 Other iron deficiency anemias: Secondary | ICD-10-CM | POA: Diagnosis not present

## 2020-07-11 DIAGNOSIS — T451X5A Adverse effect of antineoplastic and immunosuppressive drugs, initial encounter: Secondary | ICD-10-CM

## 2020-07-11 DIAGNOSIS — C7951 Secondary malignant neoplasm of bone: Secondary | ICD-10-CM

## 2020-07-11 DIAGNOSIS — D6481 Anemia due to antineoplastic chemotherapy: Secondary | ICD-10-CM

## 2020-07-11 DIAGNOSIS — M2550 Pain in unspecified joint: Secondary | ICD-10-CM | POA: Diagnosis not present

## 2020-07-11 DIAGNOSIS — K909 Intestinal malabsorption, unspecified: Secondary | ICD-10-CM | POA: Diagnosis not present

## 2020-07-11 LAB — CBC WITH DIFFERENTIAL (CANCER CENTER ONLY)
Abs Immature Granulocytes: 0.01 10*3/uL (ref 0.00–0.07)
Basophils Absolute: 0 10*3/uL (ref 0.0–0.1)
Basophils Relative: 1 %
Eosinophils Absolute: 0.1 10*3/uL (ref 0.0–0.5)
Eosinophils Relative: 6 %
HCT: 25.8 % — ABNORMAL LOW (ref 39.0–52.0)
Hemoglobin: 8.3 g/dL — ABNORMAL LOW (ref 13.0–17.0)
Immature Granulocytes: 1 %
Lymphocytes Relative: 25 %
Lymphs Abs: 0.5 10*3/uL — ABNORMAL LOW (ref 0.7–4.0)
MCH: 34.3 pg — ABNORMAL HIGH (ref 26.0–34.0)
MCHC: 32.2 g/dL (ref 30.0–36.0)
MCV: 106.6 fL — ABNORMAL HIGH (ref 80.0–100.0)
Monocytes Absolute: 0.2 10*3/uL (ref 0.1–1.0)
Monocytes Relative: 10 %
Neutro Abs: 1.1 10*3/uL — ABNORMAL LOW (ref 1.7–7.7)
Neutrophils Relative %: 57 %
Platelet Count: 198 10*3/uL (ref 150–400)
RBC: 2.42 MIL/uL — ABNORMAL LOW (ref 4.22–5.81)
RDW: 16.5 % — ABNORMAL HIGH (ref 11.5–15.5)
WBC Count: 2 10*3/uL — ABNORMAL LOW (ref 4.0–10.5)
nRBC: 0 % (ref 0.0–0.2)

## 2020-07-11 LAB — LACTATE DEHYDROGENASE: LDH: 160 U/L (ref 98–192)

## 2020-07-11 LAB — CMP (CANCER CENTER ONLY)
ALT: 13 U/L (ref 0–44)
AST: 12 U/L — ABNORMAL LOW (ref 15–41)
Albumin: 3.7 g/dL (ref 3.5–5.0)
Alkaline Phosphatase: 66 U/L (ref 38–126)
Anion gap: 9 (ref 5–15)
BUN: 19 mg/dL (ref 8–23)
CO2: 24 mmol/L (ref 22–32)
Calcium: 9 mg/dL (ref 8.9–10.3)
Chloride: 103 mmol/L (ref 98–111)
Creatinine: 1.22 mg/dL (ref 0.61–1.24)
GFR, Estimated: >60 mL/min (ref >60)
Glucose, Bld: 209 mg/dL — ABNORMAL HIGH (ref 70–99)
Potassium: 3.7 mmol/L (ref 3.5–5.1)
Sodium: 136 mmol/L (ref 135–145)
Total Bilirubin: 0.2 mg/dL — ABNORMAL LOW (ref 0.3–1.2)
Total Protein: 5.9 g/dL — ABNORMAL LOW (ref 6.5–8.1)

## 2020-07-11 LAB — IRON AND TIBC
Iron: 65 ug/dL (ref 42–163)
Saturation Ratios: 29 % (ref 20–55)
TIBC: 221 ug/dL (ref 202–409)
UIBC: 157 ug/dL (ref 117–376)

## 2020-07-11 LAB — PREPARE RBC (CROSSMATCH)

## 2020-07-11 LAB — MAGNESIUM: Magnesium: 2 mg/dL (ref 1.7–2.4)

## 2020-07-11 LAB — FERRITIN: Ferritin: 471 ng/mL — ABNORMAL HIGH (ref 24–336)

## 2020-07-11 MED ORDER — HEPARIN SOD (PORK) LOCK FLUSH 100 UNIT/ML IV SOLN
500.0000 [IU] | Freq: Once | INTRAVENOUS | Status: AC | PRN
  Administered 2020-07-11: 500 [IU]
  Filled 2020-07-11: qty 5

## 2020-07-11 MED ORDER — SODIUM CHLORIDE 0.9% FLUSH
10.0000 mL | INTRAVENOUS | Status: AC | PRN
  Administered 2020-07-11: 10 mL
  Filled 2020-07-11: qty 10

## 2020-07-11 NOTE — Addendum Note (Signed)
Addended by: Shelda Altes on: 07/11/2020 10:27 AM   Modules accepted: Orders

## 2020-07-11 NOTE — Patient Instructions (Signed)
Implanted Port Home Guide An implanted port is a device that is placed under the skin. It is usually placed in the chest. The device can be used to give IV medicine, to take blood, or for dialysis. You may have an implanted port if: You need IV medicine that would be irritating to the small veins in your hands or arms. You need IV medicines, such as antibiotics, for a long period of time. You need IV nutrition for a long period of time. You need dialysis. When you have a port, your health care provider can choose to use the port instead of veins in your arms for these procedures. You may have fewer limitations when using a port than you would if you used other types of long-term IVs, and you will likely be able to return to normal activities afteryour incision heals. An implanted port has two main parts: Reservoir. The reservoir is the part where a needle is inserted to give medicines or draw blood. The reservoir is round. After it is placed, it appears as a small, raised area under your skin. Catheter. The catheter is a thin, flexible tube that connects the reservoir to a vein. Medicine that is inserted into the reservoir goes into the catheter and then into the vein. How is my port accessed? To access your port: A numbing cream may be placed on the skin over the port site. Your health care provider will put on a mask and sterile gloves. The skin over your port will be cleaned carefully with a germ-killing soap and allowed to dry. Your health care provider will gently pinch the port and insert a needle into it. Your health care provider will check for a blood return to make sure the port is in the vein and is not clogged. If your port needs to remain accessed to get medicine continuously (constant infusion), your health care provider will place a clear bandage (dressing) over the needle site. The dressing and needle will need to be changed every week, or as told by your health care provider. What  is flushing? Flushing helps keep the port from getting clogged. Follow instructions from your health care provider about how and when to flush the port. Ports are usually flushed with saline solution or a medicine called heparin. The need for flushing will depend on how the port is used: If the port is only used from time to time to give medicines or draw blood, the port may need to be flushed: Before and after medicines have been given. Before and after blood has been drawn. As part of routine maintenance. Flushing may be recommended every 4-6 weeks. If a constant infusion is running, the port may not need to be flushed. Throw away any syringes in a disposal container that is meant for sharp items (sharps container). You can buy a sharps container from a pharmacy, or you can make one by using an empty hard plastic bottle with a cover. How long will my port stay implanted? The port can stay in for as long as your health care provider thinks it is needed. When it is time for the port to come out, a surgery will be done to remove it. The surgery will be similar to the procedure that was done to putthe port in. Follow these instructions at home:  Flush your port as told by your health care provider. If you need an infusion over several days, follow instructions from your health care provider about how to take   care of your port site. Make sure you: Wash your hands with soap and water before you change your dressing. If soap and water are not available, use alcohol-based hand sanitizer. Change your dressing as told by your health care provider. Place any used dressings or infusion bags into a plastic bag. Throw that bag in the trash. Keep the dressing that covers the needle clean and dry. Do not get it wet. Do not use scissors or sharp objects near the tube. Keep the tube clamped, unless it is being used. Check your port site every day for signs of infection. Check for: Redness, swelling, or  pain. Fluid or blood. Pus or a bad smell. Protect the skin around the port site. Avoid wearing bra straps that rub or irritate the site. Protect the skin around your port from seat belts. Place a soft pad over your chest if needed. Bathe or shower as told by your health care provider. The site may get wet as long as you are not actively receiving an infusion. Return to your normal activities as told by your health care provider. Ask your health care provider what activities are safe for you. Carry a medical alert card or wear a medical alert bracelet at all times. This will let health care providers know that you have an implanted port in case of an emergency. Get help right away if: You have redness, swelling, or pain at the port site. You have fluid or blood coming from your port site. You have pus or a bad smell coming from the port site. You have a fever. Summary Implanted ports are usually placed in the chest for long-term IV access. Follow instructions from your health care provider about flushing the port and changing bandages (dressings). Take care of the area around your port by avoiding clothing that puts pressure on the area, and by watching for signs of infection. Protect the skin around your port from seat belts. Place a soft pad over your chest if needed. Get help right away if you have a fever or you have redness, swelling, pain, drainage, or a bad smell at the port site. This information is not intended to replace advice given to you by your health care provider. Make sure you discuss any questions you have with your healthcare provider. Document Revised: 05/18/2019 Document Reviewed: 05/18/2019 Elsevier Patient Education  2022 [employer] Inc.  

## 2020-07-11 NOTE — Progress Notes (Signed)
Hematology and Oncology Follow Up Visit  [name] [id number] 10-04-1945 75 y.o. 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 Iron def anemia -- malabsorption   Past Therapy: Zytiga 1000 mg by mouth daily - discontinued on 08/15/2016 Xtandi 160 mg po q day - start on 08/15/2016 - discontinued Radium-223 therapy -- s/p cycle #6 Palliative radiation to the right sacrum Lutathera - s/p cycle 4/4 --last dose on 07/11/2017 Carbo/VP-16/Tecentriq --  S/p cycle #5 Keytruda q 6 week -- Maintanence -- s/p cycle #3 - changed from 3 to 6 week on 06/30/2018 Taxotere/Keytruda -- start on 09/17/2018 -- s/p cycle #10 -- d/c due to progression   Current Therapy:        Lurbinectedin -- started on 04/29/2019, s/p cycle 17 -- d/c on 05/2020 due to progression CDDP/Irinotecan -- s/p cycle #1 -- start on 06/15/2020 Xgeva 120 mg subcutaneous Q3 months - due in 07/2020 Lupron 22 mg IM every 3 months - due in 07/2020  SBRT to sacral lesion  IV iron as indicated    Interim History:  [name] is here today for follow-up.  He has for cycle of chemotherapy with the cisplatin/irinotecan.  He had diarrhea for about 3 days.  This did resolve.  He says he not have any problems with pain.  Hopefully, we are seeing a response to the chemotherapy.  I think that we will get have to hold on his treatment today.  I just do not think that he should get treated.  His hemoglobin is only 8.3.  His white cell count is 2.0.  I do think he probably going to need to be transfused.  I feel that this will benefit him.  With the holiday weekend coming up, I think that we should be able to improve his status.  He is having no problems with cough..  He does have some stamina issues.  Of note, he was hospitalized with COVID about a month ago.  He seems to have resolved the symptoms.  His last Chromogranin A level was  820.  This was back in May.  His appetite is pretty decent.  He has had no mouth sores.  He has had no nausea or vomiting.  Currently, his performance status is ECOG 1.   Medications:  Allergies as of 07/11/2020       Reactions   Codeine Nausea And Vomiting   Hydrocodone Nausea And Vomiting        Medication List        Accurate as of July 11, 2020  9:37 AM. If you have any questions, ask your nurse or doctor.          acetaminophen 500 MG tablet Commonly known as: TYLENOL Take 1 tablet (500 mg total) by mouth every 4 (four) hours as needed for mild pain, headache or fever (fever >/= 101).   Anti-Diarrheal 2 MG tablet Generic drug: loperamide Take 2 tablets by mouth at diarrhea onset, then 1 tablet every 2 hours until 12 hours with no bowel movment. May take 2 tablets every 4 hours at night. If diarrhea recurs repeat.   ascorbic acid 500 MG tablet Commonly known as: VITAMIN C Take 1 tablet (500 mg total) by mouth daily.   aspirin 81 MG tablet Take 81 mg by mouth daily.   dexamethasone 4 MG tablet Commonly known as: DECADRON Take 4 mg by mouth daily. X 3 days  dexamethasone 4 MG tablet Commonly known as: DECADRON Take 2 tablets (8 mg total) by mouth daily for 3 days starting the day after cisplatin chemotherapy. Take with food.   ezetimibe 10 MG tablet Commonly known as: ZETIA TAKE 1 TABLET BY MOUTH DAILY What changed: how much to take   gabapentin 300 MG capsule Commonly known as: NEURONTIN TAKE 1 CAPSULE BY MOUTH TWICE A DAY THEN TAKE 2 CAPSULES AT BEDTIME What changed:  how much to take how to take this when to take this   Generlac 10 GM/15ML Soln Generic drug: lactulose (encephalopathy) Take 15 mLs (10 g total) by mouth daily as needed for constipation.   isosorbide mononitrate 30 MG 24 hr tablet Commonly known as: IMDUR TAKE 1 TABLET (30 MG TOTAL) BY MOUTH DAILY. What changed:  how much to take when to take this   lidocaine-prilocaine  cream Commonly known as: EMLA Apply to affected area once   LORazepam 0.5 MG tablet Commonly known as: Ativan Take 1 tablet (0.5 mg total) by mouth every 6 (six) hours as needed (Nausea or vomiting).   ondansetron 8 MG tablet Commonly known as: Zofran Take 1 tablet (8 mg total) by mouth 2 (two) times daily as needed. Start on the third day after cisplatin chemotherapy.   OVER THE COUNTER MEDICATION every morning. Juice Plus -- 8 capsule of each the garden, vineyard, and orchard twice a day.   pantoprazole 40 MG tablet Commonly known as: PROTONIX TAKE 1 TABLET (40 MG TOTAL) BY MOUTH AT BEDTIME.   predniSONE 10 MG tablet Commonly known as: DELTASONE Take 5 tablets by mouth on day 1 then, 4 tablets on day 2, then 3 tabs on day 3, then 2 tabs on day 4, then 1 tab on day 5, then STOP.   prochlorperazine 10 MG tablet Commonly known as: COMPAZINE Take 1 tablet (10 mg total) by mouth every 6 (six) hours as needed for nausea or vomiting.   prochlorperazine 10 MG tablet Commonly known as: COMPAZINE Take 1 tablet (10 mg total) by mouth every 6 (six) hours as needed for nausea or vomiting.   Zinc 50 MG Caps Take 50 mg by mouth daily.        Allergies:  Allergies  Allergen Reactions   Codeine Nausea And Vomiting   Hydrocodone Nausea And Vomiting    Past Medical History, Surgical history, Social history, and Family History were reviewed and updated.  Review of Systems: Review of Systems  Constitutional: Negative.   HENT: Negative.    Eyes: Negative.   Respiratory: Negative.    Cardiovascular: Negative.   Gastrointestinal: Negative.   Genitourinary: Negative.   Musculoskeletal:  Positive for joint pain.  Skin: Negative.   Neurological:  Positive for focal weakness.  Endo/Heme/Allergies: Negative.   Psychiatric/Behavioral: Negative.      Physical Exam:  weight is 149 lb 0.6 oz (67.6 kg). His oral temperature is 98.4 F (36.9 C). His blood pressure is 140/70 and his  pulse is 103 (abnormal). His respiration is 20 and oxygen saturation is 100%.   Wt Readings from Last 3 Encounters:  07/11/20 149 lb 0.6 oz (67.6 kg)  07/11/20 149 lb 4 oz (67.7 kg)  06/09/20 160 lb 9.6 oz (72.8 kg)    Physical Exam Vitals reviewed.  HENT:     Head: Normocephalic and atraumatic.  Eyes:     Pupils: Pupils are equal, round, and reactive to light.  Cardiovascular:     Rate and Rhythm: Normal rate and regular  rhythm.     Heart sounds: Normal heart sounds.  Pulmonary:     Effort: Pulmonary effort is normal.     Breath sounds: Normal breath sounds.  Abdominal:     General: Bowel sounds are normal.     Palpations: Abdomen is soft.  Musculoskeletal:        General: No tenderness or deformity. Normal range of motion.     Cervical back: Normal range of motion.  Lymphadenopathy:     Cervical: No cervical adenopathy.  Skin:    General: Skin is warm and dry.     Findings: No erythema or rash.  Neurological:     Mental Status: He is alert and oriented to person, place, and time.  Psychiatric:        Behavior: Behavior normal.        Thought Content: Thought content normal.        Judgment: Judgment normal.   Lab Results  Component Value Date   WBC 2.0 (L) 07/11/2020   HGB 8.3 (L) 07/11/2020   HCT 25.8 (L) 07/11/2020   MCV 106.6 (H) 07/11/2020   PLT 198 07/11/2020   Lab Results  Component Value Date   FERRITIN 706 (H) 06/07/2020   IRON 81 06/07/2020   TIBC 259 06/07/2020   UIBC 178 06/07/2020   IRONPCTSAT 31 06/07/2020   Lab Results  Component Value Date   RETICCTPCT 1.9 06/07/2020   RBC 2.42 (L) 07/11/2020   No results found for: KPAFRELGTCHN, LAMBDASER, KAPLAMBRATIO No results found for: IGGSERUM, IGA, IGMSERUM No results found for: Kathrynn Ducking, MSPIKE, SPEI   Chemistry      Component Value Date/Time   NA 135 06/27/2020 0810   NA 144 12/14/2016 1159   NA 140 10/22/2016 1129   K 4.1 06/27/2020 0810    K 3.5 12/14/2016 1159   K 4.3 10/22/2016 1129   CL 101 06/27/2020 0810   CL 103 12/14/2016 1159   CO2 23 06/27/2020 0810   CO2 30 12/14/2016 1159   CO2 27 10/22/2016 1129   BUN 29 (H) 06/27/2020 0810   BUN 16 12/14/2016 1159   BUN 14.0 10/22/2016 1129   CREATININE 1.42 (H) 06/27/2020 0810   CREATININE 1.39 (H) 06/20/2020 0810   CREATININE 1.3 (H) 12/14/2016 1159   CREATININE 1.1 10/22/2016 1129      Component Value Date/Time   CALCIUM 9.9 06/27/2020 0810   CALCIUM 10.0 12/14/2016 1159   CALCIUM 10.4 10/22/2016 1129   ALKPHOS 57 06/27/2020 0810   ALKPHOS 48 12/14/2016 1159   ALKPHOS 60 10/22/2016 1129   AST 13 (L) 06/27/2020 0810   AST 15 06/20/2020 0810   AST 16 10/22/2016 1129   ALT 17 06/27/2020 0810   ALT 15 06/20/2020 0810   ALT 23 12/14/2016 1159   ALT 14 10/22/2016 1129   BILITOT 0.4 06/27/2020 0810   BILITOT 0.3 06/20/2020 0810   BILITOT 0.31 10/22/2016 1129       Impression and Plan: [name] is 76 yo caucasian gentleman with metastatic neuroendocrine carcinoma of unknown primary.  Everything has been holding pretty steady except for down of the sacral area.  We have given him some localized radiation therapy to this area to help prevent problems.  So far it seems to be helping.  Hopefully, the new protocol with cisplatin/irinotecan is helping.  Again his blood counts are little bit on the lower side right now.  I really want to transfuse him this week.  I would like to hold his chemotherapy for 1 week.  Think given all the chemo that he has had, and probably would not be a bad idea to make his protocol every 28 days.  I do not think we have to do any type of colony-stimulating factor right now.  Of note, his erythropoietin level is only 34.  As such, we might want to consider Aranesp for his anemia depending on how things look with his future blood counts.     6/27/20229:37 AM

## 2020-07-12 ENCOUNTER — Inpatient Hospital Stay: Payer: Medicare Other

## 2020-07-12 VITALS — BP 134/86 | HR 79 | Temp 98.9°F | Resp 16

## 2020-07-12 DIAGNOSIS — C7B8 Other secondary neuroendocrine tumors: Secondary | ICD-10-CM | POA: Diagnosis not present

## 2020-07-12 DIAGNOSIS — C61 Malignant neoplasm of prostate: Secondary | ICD-10-CM | POA: Diagnosis not present

## 2020-07-12 DIAGNOSIS — D508 Other iron deficiency anemias: Secondary | ICD-10-CM | POA: Diagnosis not present

## 2020-07-12 DIAGNOSIS — M2550 Pain in unspecified joint: Secondary | ICD-10-CM | POA: Diagnosis not present

## 2020-07-12 DIAGNOSIS — T451X5A Adverse effect of antineoplastic and immunosuppressive drugs, initial encounter: Secondary | ICD-10-CM

## 2020-07-12 DIAGNOSIS — Z5111 Encounter for antineoplastic chemotherapy: Secondary | ICD-10-CM | POA: Diagnosis not present

## 2020-07-12 DIAGNOSIS — K909 Intestinal malabsorption, unspecified: Secondary | ICD-10-CM | POA: Diagnosis not present

## 2020-07-12 LAB — CHROMOGRANIN A: Chromogranin A (ng/mL): 950.2 ng/mL — ABNORMAL HIGH (ref 0.0–101.8)

## 2020-07-12 MED ORDER — HEPARIN SOD (PORK) LOCK FLUSH 100 UNIT/ML IV SOLN
500.0000 [IU] | Freq: Every day | INTRAVENOUS | Status: AC | PRN
  Administered 2020-07-12: 500 [IU]
  Filled 2020-07-12: qty 5

## 2020-07-12 MED ORDER — DIPHENHYDRAMINE HCL 25 MG PO CAPS
25.0000 mg | ORAL_CAPSULE | Freq: Once | ORAL | Status: AC
  Administered 2020-07-12: 25 mg via ORAL

## 2020-07-12 MED ORDER — ACETAMINOPHEN 325 MG PO TABS
650.0000 mg | ORAL_TABLET | Freq: Once | ORAL | Status: AC
  Administered 2020-07-12: 650 mg via ORAL

## 2020-07-12 MED ORDER — SODIUM CHLORIDE 0.9% FLUSH
10.0000 mL | INTRAVENOUS | Status: AC | PRN
  Administered 2020-07-12: 10 mL
  Filled 2020-07-12: qty 10

## 2020-07-12 NOTE — Patient Instructions (Signed)
https://www.[facility].org/donate-blood/blood-donation-process/what-happens-to-donated-blood/blood-transfusions/types-of-blood-transfusions.html"> https://www.hematology.org/education/patients/blood-basics/blood-safety-and-matching"> https://www.nhlbi.nih.gov/health-topics/blood-transfusion">  Blood Transfusion, Adult A blood transfusion is a procedure in which you receive blood or a type of blood cell (blood component) through an IV. You may need a blood transfusion when your blood level is low. This may result from a bleeding disorder, illness, injury, or surgery. The blood may come from a donor. You may also be able to donate blood for yourself (autologous blood donation) before a planned surgery. The blood given in a transfusion is made up of different blood components. You may receive: Red blood cells. These carry oxygen to the cells in the body. Platelets. These help your blood to clot. Plasma. This is the liquid part of your blood. It carries proteins and other substances throughout the body. White blood cells. These help you fight infections. If you have hemophilia or another clotting disorder, you may also receive othertypes of blood products. Tell a health care provider about: Any blood disorders you have. Any previous reactions you have had during a blood transfusion. Any allergies you have. All medicines you are taking, including vitamins, herbs, eye drops, creams, and over-the-counter medicines. Any surgeries you have had. Any medical conditions you have, including any recent fever or cold symptoms. Whether you are pregnant or may be pregnant. What are the risks? Generally, this is a safe procedure. However, problems may occur. The most common problems include: A mild allergic reaction, such as red, swollen areas of skin (hives) and itching. Fever or chills. This may be the body's response to new blood cells received. This may occur during or up to 4 hours after the  transfusion. More serious problems may include: Transfusion-associated circulatory overload (TACO), or too much fluid in the lungs. This may cause breathing problems. A serious allergic reaction, such as difficulty breathing or swelling around the face and lips. Transfusion-related acute lung injury (TRALI), which causes breathing difficulty and low oxygen in the blood. This can occur within hours of the transfusion or several days later. Iron overload. This can happen after receiving many blood transfusions over a period of time. Infection or virus being transmitted. This is rare because donated blood is carefully tested before it is given. Hemolytic transfusion reaction. This is rare. It happens when your body's defense system (immune system)tries to attack the new blood cells. Symptoms may include fever, chills, nausea, low blood pressure, and low back or chest pain. Transfusion-associated graft-versus-host disease (TAGVHD). This is rare. It happens when donated cells attack your body's healthy tissues. What happens before the procedure? Medicines Ask your health care provider about: Changing or stopping your regular medicines. This is especially important if you are taking diabetes medicines or blood thinners. Taking medicines such as aspirin and ibuprofen. These medicines can thin your blood. Do not take these medicines unless your health care provider tells you to take them. Taking over-the-counter medicines, vitamins, herbs, and supplements. General instructions Follow instructions from your health care provider about eating and drinking restrictions. You will have a blood test to determine your blood type. This is necessary to know what kind of blood your body will accept and to match it to the donor blood. If you are going to have a planned surgery, you may be able to do an autologous blood donation. This may be done in case you need to have a transfusion. You will have your temperature,  blood pressure, and pulse monitored before the transfusion. If you have had an allergic reaction to a transfusion in the past, you may be given   medicine to help prevent a reaction. This medicine may be given to you by mouth (orally) or through an IV. Set aside time for the blood transfusion. This procedure generally takes 1-4 hours to complete. What happens during the procedure?  An IV will be inserted into one of your veins. The bag of donated blood will be attached to your IV. The blood will then enter through your vein. Your temperature, blood pressure, and pulse will be monitored regularly during the transfusion. This monitoring is done to detect early signs of a transfusion reaction. Tell your nurse right away if you have any of these symptoms during the transfusion: Shortness of breath or trouble breathing. Chest or back pain. Fever or chills. Hives or itching. If you have any signs or symptoms of a reaction, your transfusion will be stopped and you may be given medicine. When the transfusion is complete, your IV will be removed. Pressure may be applied to the IV site for a few minutes. A bandage (dressing)will be applied. The procedure may vary among health care providers and hospitals. What happens after the procedure? Your temperature, blood pressure, pulse, breathing rate, and blood oxygen level will be monitored until you leave the hospital or clinic. Your blood may be tested to see how you are responding to the transfusion. You may be warmed with fluids or blankets to maintain a normal body temperature. If you receive your blood transfusion in an outpatient setting, you will be told whom to contact to report any reactions. Where to find more information For more information on blood transfusions, visit the [employer]: redcross.org Summary A blood transfusion is a procedure in which you receive blood or a type of blood cell (blood component) through an IV. The blood you  receive may come from a donor or be donated by yourself (autologous blood donation) before a planned surgery. The blood given in a transfusion is made up of different blood components. You may receive red blood cells, platelets, plasma, or white blood cells depending on the condition treated. Your temperature, blood pressure, and pulse will be monitored before, during, and after the transfusion. After the transfusion, your blood may be tested to see how your body has responded. This information is not intended to replace advice given to you by your health care provider. Make sure you discuss any questions you have with your healthcare provider. Document Revised: 11/06/2018 Document Reviewed: 06/26/2018 Elsevier Patient Education  2022 [employer] Inc.  

## 2020-07-13 LAB — TYPE AND SCREEN
ABO/RH(D): O POS
Antibody Screen: NEGATIVE
Unit division: 0
Unit division: 0

## 2020-07-13 LAB — BPAM RBC
Blood Product Expiration Date: 202207302359
Blood Product Expiration Date: 202207302359
ISSUE DATE / TIME: 202206280755
ISSUE DATE / TIME: 202206280755
Unit Type and Rh: 5100
Unit Type and Rh: 5100

## 2020-07-14 DIAGNOSIS — M9903 Segmental and somatic dysfunction of lumbar region: Secondary | ICD-10-CM | POA: Diagnosis not present

## 2020-07-14 DIAGNOSIS — M9904 Segmental and somatic dysfunction of sacral region: Secondary | ICD-10-CM | POA: Diagnosis not present

## 2020-07-14 DIAGNOSIS — M9901 Segmental and somatic dysfunction of cervical region: Secondary | ICD-10-CM | POA: Diagnosis not present

## 2020-07-14 DIAGNOSIS — M9902 Segmental and somatic dysfunction of thoracic region: Secondary | ICD-10-CM | POA: Diagnosis not present

## 2020-07-15 ENCOUNTER — Other Ambulatory Visit: Payer: Self-pay | Admitting: *Deleted

## 2020-07-15 DIAGNOSIS — T451X5A Adverse effect of antineoplastic and immunosuppressive drugs, initial encounter: Secondary | ICD-10-CM

## 2020-07-15 DIAGNOSIS — D500 Iron deficiency anemia secondary to blood loss (chronic): Secondary | ICD-10-CM

## 2020-07-15 DIAGNOSIS — C61 Malignant neoplasm of prostate: Secondary | ICD-10-CM

## 2020-07-19 ENCOUNTER — Inpatient Hospital Stay: Payer: Medicare Other

## 2020-07-19 ENCOUNTER — Inpatient Hospital Stay: Payer: Medicare Other | Attending: Hematology & Oncology

## 2020-07-19 ENCOUNTER — Other Ambulatory Visit: Payer: Self-pay

## 2020-07-19 DIAGNOSIS — K521 Toxic gastroenteritis and colitis: Secondary | ICD-10-CM | POA: Diagnosis not present

## 2020-07-19 DIAGNOSIS — D500 Iron deficiency anemia secondary to blood loss (chronic): Secondary | ICD-10-CM

## 2020-07-19 DIAGNOSIS — C61 Malignant neoplasm of prostate: Secondary | ICD-10-CM | POA: Insufficient documentation

## 2020-07-19 DIAGNOSIS — C7A8 Other malignant neuroendocrine tumors: Secondary | ICD-10-CM

## 2020-07-19 DIAGNOSIS — C7B8 Other secondary neuroendocrine tumors: Secondary | ICD-10-CM | POA: Diagnosis not present

## 2020-07-19 DIAGNOSIS — C7951 Secondary malignant neoplasm of bone: Secondary | ICD-10-CM

## 2020-07-19 DIAGNOSIS — T451X5A Adverse effect of antineoplastic and immunosuppressive drugs, initial encounter: Secondary | ICD-10-CM | POA: Insufficient documentation

## 2020-07-19 DIAGNOSIS — Z5111 Encounter for antineoplastic chemotherapy: Secondary | ICD-10-CM | POA: Insufficient documentation

## 2020-07-19 LAB — CMP (CANCER CENTER ONLY)
ALT: 16 U/L (ref 0–44)
AST: 15 U/L (ref 15–41)
Albumin: 3.9 g/dL (ref 3.5–5.0)
Alkaline Phosphatase: 59 U/L (ref 38–126)
Anion gap: 11 (ref 5–15)
BUN: 23 mg/dL (ref 8–23)
CO2: 25 mmol/L (ref 22–32)
Calcium: 9.3 mg/dL (ref 8.9–10.3)
Chloride: 101 mmol/L (ref 98–111)
Creatinine: 1.4 mg/dL — ABNORMAL HIGH (ref 0.61–1.24)
GFR, Estimated: 53 mL/min — ABNORMAL LOW (ref >60)
Glucose, Bld: 263 mg/dL — ABNORMAL HIGH (ref 70–99)
Potassium: 3.7 mmol/L (ref 3.5–5.1)
Sodium: 137 mmol/L (ref 135–145)
Total Bilirubin: 0.2 mg/dL — ABNORMAL LOW (ref 0.3–1.2)
Total Protein: 6.1 g/dL — ABNORMAL LOW (ref 6.5–8.1)

## 2020-07-19 LAB — CBC WITH DIFFERENTIAL (CANCER CENTER ONLY)
Band Neutrophils: 0 %
Basophils Absolute: 0.1 10*3/uL (ref 0.0–0.1)
Basophils Relative: 1 %
Eosinophils Absolute: 0.1 10*3/uL (ref 0.0–0.5)
Eosinophils Relative: 1 %
HCT: 36.3 % — ABNORMAL LOW (ref 39.0–52.0)
Hemoglobin: 11.7 g/dL — ABNORMAL LOW (ref 13.0–17.0)
Lymphocytes Relative: 29 %
Lymphs Abs: 0.7 10*3/uL (ref 0.7–4.0)
MCH: 33 pg (ref 26.0–34.0)
MCHC: 32.2 g/dL (ref 30.0–36.0)
MCV: 102.3 fL — ABNORMAL HIGH (ref 80.0–100.0)
Monocytes Absolute: 0.6 10*3/uL (ref 0.1–1.0)
Monocytes Relative: 23 %
Neutro Abs: 1.1 10*3/uL — ABNORMAL LOW (ref 1.7–7.7)
Neutrophils Relative %: 45 %
Platelet Count: 281 10*3/uL (ref 150–400)
RBC: 3.55 MIL/uL — ABNORMAL LOW (ref 4.22–5.81)
RDW: 16.2 % — ABNORMAL HIGH (ref 11.5–15.5)
WBC Count: 2.5 10*3/uL — ABNORMAL LOW (ref 4.0–10.5)
nRBC: 0 % (ref 0.0–0.2)

## 2020-07-19 LAB — SAMPLE TO BLOOD BANK

## 2020-07-19 LAB — MAGNESIUM: Magnesium: 2 mg/dL (ref 1.7–2.4)

## 2020-07-19 MED ORDER — SODIUM CHLORIDE 0.9 % IV SOLN
65.0000 mg/m2 | Freq: Once | INTRAVENOUS | Status: AC
  Administered 2020-07-19: 120 mg via INTRAVENOUS
  Filled 2020-07-19: qty 6

## 2020-07-19 MED ORDER — MAGNESIUM SULFATE 2 GM/50ML IV SOLN
2.0000 g | Freq: Once | INTRAVENOUS | Status: AC
  Administered 2020-07-19: 2 g via INTRAVENOUS
  Filled 2020-07-19: qty 50

## 2020-07-19 MED ORDER — PALONOSETRON HCL INJECTION 0.25 MG/5ML
INTRAVENOUS | Status: AC
  Filled 2020-07-19: qty 5

## 2020-07-19 MED ORDER — DENOSUMAB 120 MG/1.7ML ~~LOC~~ SOLN
120.0000 mg | Freq: Once | SUBCUTANEOUS | Status: AC
  Administered 2020-07-19: 120 mg via SUBCUTANEOUS

## 2020-07-19 MED ORDER — PALONOSETRON HCL INJECTION 0.25 MG/5ML
0.2500 mg | Freq: Once | INTRAVENOUS | Status: AC
  Administered 2020-07-19: 0.25 mg via INTRAVENOUS

## 2020-07-19 MED ORDER — SODIUM CHLORIDE 0.9 % IV SOLN
Freq: Once | INTRAVENOUS | Status: AC
Start: 2020-07-19 — End: 2020-07-19
  Filled 2020-07-19: qty 250

## 2020-07-19 MED ORDER — DENOSUMAB 120 MG/1.7ML ~~LOC~~ SOLN
SUBCUTANEOUS | Status: AC
  Filled 2020-07-19: qty 1.7

## 2020-07-19 MED ORDER — HEPARIN SOD (PORK) LOCK FLUSH 100 UNIT/ML IV SOLN
500.0000 [IU] | Freq: Once | INTRAVENOUS | Status: AC | PRN
  Administered 2020-07-19: 500 [IU]
  Filled 2020-07-19: qty 5

## 2020-07-19 MED ORDER — SODIUM CHLORIDE 0.9 % IV SOLN
30.0000 mg/m2 | Freq: Once | INTRAVENOUS | Status: AC
  Administered 2020-07-19: 55 mg via INTRAVENOUS
  Filled 2020-07-19: qty 55

## 2020-07-19 MED ORDER — POTASSIUM CHLORIDE IN NACL 20-0.9 MEQ/L-% IV SOLN
Freq: Once | INTRAVENOUS | Status: AC
  Filled 2020-07-19: qty 1000

## 2020-07-19 MED ORDER — SODIUM CHLORIDE 0.9 % IV SOLN
150.0000 mg | Freq: Once | INTRAVENOUS | Status: AC
  Administered 2020-07-19: 150 mg via INTRAVENOUS
  Filled 2020-07-19: qty 150

## 2020-07-19 MED ORDER — LEUPROLIDE ACETATE (3 MONTH) 22.5 MG ~~LOC~~ KIT
22.5000 mg | PACK | Freq: Once | SUBCUTANEOUS | Status: AC
  Administered 2020-07-19: 22.5 mg via SUBCUTANEOUS
  Filled 2020-07-19: qty 22.5

## 2020-07-19 MED ORDER — SODIUM CHLORIDE 0.9% FLUSH
10.0000 mL | INTRAVENOUS | Status: DC | PRN
  Administered 2020-07-19: 10 mL
  Filled 2020-07-19: qty 10

## 2020-07-19 MED ORDER — SODIUM CHLORIDE 0.9 % IV SOLN
10.0000 mg | Freq: Once | INTRAVENOUS | Status: AC
  Administered 2020-07-19: 10 mg via INTRAVENOUS
  Filled 2020-07-19: qty 10

## 2020-07-19 NOTE — Progress Notes (Signed)
Okay to treat with ANC 1.1 today per Dr. Marin Olp.

## 2020-07-19 NOTE — Patient Instructions (Signed)
[facility] [place]  Discharge Instructions: Thank you for choosing [facility] to provide your oncology and hematology care.   If you have a lab appointment with the [facility], please go directly to the [facility] and check in at the registration area.  Wear comfortable clothing and clothing appropriate for easy access to any Portacath or PICC line.   We strive to give you quality time with your provider. You may need to reschedule your appointment if you arrive late (15 or more minutes).  Arriving late affects you and other patients whose appointments are after yours.  Also, if you miss three or more appointments without notifying the office, you may be dismissed from the clinic at the provider's discretion.      For prescription refill requests, have your pharmacy contact our office and allow 72 hours for refills to be completed.    Today you received the following chemotherapy and/or immunotherapy agents cisplatin, cpt-11   To help prevent nausea and vomiting after your treatment, we encourage you to take your nausea medication as directed.  BELOW ARE SYMPTOMS THAT SHOULD BE REPORTED IMMEDIATELY: *FEVER GREATER THAN 100.4 F (38 C) OR HIGHER *CHILLS OR SWEATING *NAUSEA AND VOMITING THAT IS NOT CONTROLLED WITH YOUR NAUSEA MEDICATION *UNUSUAL SHORTNESS OF BREATH *UNUSUAL BRUISING OR BLEEDING *URINARY PROBLEMS (pain or burning when urinating, or frequent urination) *BOWEL PROBLEMS (unusual diarrhea, constipation, pain near the anus) TENDERNESS IN MOUTH AND THROAT WITH OR WITHOUT PRESENCE OF ULCERS (sore throat, sores in mouth, or a toothache) UNUSUAL RASH, SWELLING OR PAIN  UNUSUAL VAGINAL DISCHARGE OR ITCHING   Items with * indicate a potential emergency and should be followed up as soon as possible or go to the Emergency Department if any problems should occur.  Please show the CHEMOTHERAPY ALERT CARD or IMMUNOTHERAPY ALERT CARD at check-in to  the Emergency Department and triage nurse. Should you have questions after your visit or need to cancel or reschedule your appointment, please contact [facility]  [phone] and follow the prompts.  Office hours are 8:00 a.m. to 4:30 p.m. Monday - Friday. Please note that voicemails left after 4:00 p.m. may not be returned until the following business day.  We are closed weekends and major holidays. You have access to a nurse at all times for urgent questions. Please call the main number to the clinic [phone] and follow the prompts.  For any non-urgent questions, you may also contact your provider using MyChart. We now offer e-Visits for anyone 76 and older to request care online for non-urgent symptoms. For details visit [email or web address].   Also download the MyChart app! Go to the app store, search "MyChart", open the app, select [facility], and log in with your MyChart username and password.  Due to Covid, a mask is required upon entering the hospital/clinic. If you do not have a mask, one will be given to you upon arrival. For doctor visits, patients may have 1 support person aged 3 or older with them. For treatment visits, patients cannot have anyone with them due to current Covid guidelines and our immunocompromised population.

## 2020-07-19 NOTE — Patient Instructions (Signed)
Implanted Port Home Guide An implanted port is a device that is placed under the skin. It is usually placed in the chest. The device can be used to give IV medicine, to take blood, or for dialysis. You may have an implanted port if: You need IV medicine that would be irritating to the small veins in your hands or arms. You need IV medicines, such as antibiotics, for a long period of time. You need IV nutrition for a long period of time. You need dialysis. When you have a port, your health care provider can choose to use the port instead of veins in your arms for these procedures. You may have fewer limitations when using a port than you would if you used other types of long-term IVs, and you will likely be able to return to normal activities afteryour incision heals. An implanted port has two main parts: Reservoir. The reservoir is the part where a needle is inserted to give medicines or draw blood. The reservoir is round. After it is placed, it appears as a small, raised area under your skin. Catheter. The catheter is a thin, flexible tube that connects the reservoir to a vein. Medicine that is inserted into the reservoir goes into the catheter and then into the vein. How is my port accessed? To access your port: A numbing cream may be placed on the skin over the port site. Your health care provider will put on a mask and sterile gloves. The skin over your port will be cleaned carefully with a germ-killing soap and allowed to dry. Your health care provider will gently pinch the port and insert a needle into it. Your health care provider will check for a blood return to make sure the port is in the vein and is not clogged. If your port needs to remain accessed to get medicine continuously (constant infusion), your health care provider will place a clear bandage (dressing) over the needle site. The dressing and needle will need to be changed every week, or as told by your health care provider. What  is flushing? Flushing helps keep the port from getting clogged. Follow instructions from your health care provider about how and when to flush the port. Ports are usually flushed with saline solution or a medicine called heparin. The need for flushing will depend on how the port is used: If the port is only used from time to time to give medicines or draw blood, the port may need to be flushed: Before and after medicines have been given. Before and after blood has been drawn. As part of routine maintenance. Flushing may be recommended every 4-6 weeks. If a constant infusion is running, the port may not need to be flushed. Throw away any syringes in a disposal container that is meant for sharp items (sharps container). You can buy a sharps container from a pharmacy, or you can make one by using an empty hard plastic bottle with a cover. How long will my port stay implanted? The port can stay in for as long as your health care provider thinks it is needed. When it is time for the port to come out, a surgery will be done to remove it. The surgery will be similar to the procedure that was done to putthe port in. Follow these instructions at home:  Flush your port as told by your health care provider. If you need an infusion over several days, follow instructions from your health care provider about how to take   care of your port site. Make sure you: Wash your hands with soap and water before you change your dressing. If soap and water are not available, use alcohol-based hand sanitizer. Change your dressing as told by your health care provider. Place any used dressings or infusion bags into a plastic bag. Throw that bag in the trash. Keep the dressing that covers the needle clean and dry. Do not get it wet. Do not use scissors or sharp objects near the tube. Keep the tube clamped, unless it is being used. Check your port site every day for signs of infection. Check for: Redness, swelling, or  pain. Fluid or blood. Pus or a bad smell. Protect the skin around the port site. Avoid wearing bra straps that rub or irritate the site. Protect the skin around your port from seat belts. Place a soft pad over your chest if needed. Bathe or shower as told by your health care provider. The site may get wet as long as you are not actively receiving an infusion. Return to your normal activities as told by your health care provider. Ask your health care provider what activities are safe for you. Carry a medical alert card or wear a medical alert bracelet at all times. This will let health care providers know that you have an implanted port in case of an emergency. Get help right away if: You have redness, swelling, or pain at the port site. You have fluid or blood coming from your port site. You have pus or a bad smell coming from the port site. You have a fever. Summary Implanted ports are usually placed in the chest for long-term IV access. Follow instructions from your health care provider about flushing the port and changing bandages (dressings). Take care of the area around your port by avoiding clothing that puts pressure on the area, and by watching for signs of infection. Protect the skin around your port from seat belts. Place a soft pad over your chest if needed. Get help right away if you have a fever or you have redness, swelling, pain, drainage, or a bad smell at the port site. This information is not intended to replace advice given to you by your health care provider. Make sure you discuss any questions you have with your healthcare provider. Document Revised: 05/18/2019 Document Reviewed: 05/18/2019 Elsevier Patient Education  2022 [employer] Inc.  

## 2020-07-21 DIAGNOSIS — M9904 Segmental and somatic dysfunction of sacral region: Secondary | ICD-10-CM | POA: Diagnosis not present

## 2020-07-21 DIAGNOSIS — M9903 Segmental and somatic dysfunction of lumbar region: Secondary | ICD-10-CM | POA: Diagnosis not present

## 2020-07-21 DIAGNOSIS — M9902 Segmental and somatic dysfunction of thoracic region: Secondary | ICD-10-CM | POA: Diagnosis not present

## 2020-07-21 DIAGNOSIS — M9901 Segmental and somatic dysfunction of cervical region: Secondary | ICD-10-CM | POA: Diagnosis not present

## 2020-07-25 ENCOUNTER — Other Ambulatory Visit: Payer: Self-pay | Admitting: *Deleted

## 2020-07-25 DIAGNOSIS — C7951 Secondary malignant neoplasm of bone: Secondary | ICD-10-CM

## 2020-07-26 ENCOUNTER — Other Ambulatory Visit: Payer: Self-pay

## 2020-07-26 ENCOUNTER — Inpatient Hospital Stay: Payer: Medicare Other

## 2020-07-26 VITALS — BP 111/76 | HR 94 | Temp 97.7°F | Resp 17

## 2020-07-26 DIAGNOSIS — C7A8 Other malignant neuroendocrine tumors: Secondary | ICD-10-CM

## 2020-07-26 DIAGNOSIS — C7951 Secondary malignant neoplasm of bone: Secondary | ICD-10-CM

## 2020-07-26 DIAGNOSIS — Z5111 Encounter for antineoplastic chemotherapy: Secondary | ICD-10-CM | POA: Diagnosis not present

## 2020-07-26 DIAGNOSIS — C7B8 Other secondary neuroendocrine tumors: Secondary | ICD-10-CM | POA: Diagnosis not present

## 2020-07-26 DIAGNOSIS — K521 Toxic gastroenteritis and colitis: Secondary | ICD-10-CM | POA: Diagnosis not present

## 2020-07-26 DIAGNOSIS — C61 Malignant neoplasm of prostate: Secondary | ICD-10-CM | POA: Diagnosis not present

## 2020-07-26 DIAGNOSIS — T451X5A Adverse effect of antineoplastic and immunosuppressive drugs, initial encounter: Secondary | ICD-10-CM | POA: Diagnosis not present

## 2020-07-26 LAB — COMPREHENSIVE METABOLIC PANEL
ALT: 19 U/L (ref 0–44)
AST: 13 U/L — ABNORMAL LOW (ref 15–41)
Albumin: 4 g/dL (ref 3.5–5.0)
Alkaline Phosphatase: 57 U/L (ref 38–126)
Anion gap: 11 (ref 5–15)
BUN: 29 mg/dL — ABNORMAL HIGH (ref 8–23)
CO2: 24 mmol/L (ref 22–32)
Calcium: 9.8 mg/dL (ref 8.9–10.3)
Chloride: 98 mmol/L (ref 98–111)
Creatinine, Ser: 1.63 mg/dL — ABNORMAL HIGH (ref 0.61–1.24)
GFR, Estimated: 44 mL/min — ABNORMAL LOW (ref >60)
Glucose, Bld: 168 mg/dL — ABNORMAL HIGH (ref 70–99)
Potassium: 3.8 mmol/L (ref 3.5–5.1)
Sodium: 133 mmol/L — ABNORMAL LOW (ref 135–145)
Total Bilirubin: 0.3 mg/dL (ref 0.3–1.2)
Total Protein: 6.1 g/dL — ABNORMAL LOW (ref 6.5–8.1)

## 2020-07-26 LAB — CBC WITH DIFFERENTIAL (CANCER CENTER ONLY)
Abs Immature Granulocytes: 0.12 10*3/uL — ABNORMAL HIGH (ref 0.00–0.07)
Basophils Absolute: 0 10*3/uL (ref 0.0–0.1)
Basophils Relative: 0 %
Eosinophils Absolute: 0 10*3/uL (ref 0.0–0.5)
Eosinophils Relative: 0 %
HCT: 38.5 % — ABNORMAL LOW (ref 39.0–52.0)
Hemoglobin: 12.6 g/dL — ABNORMAL LOW (ref 13.0–17.0)
Immature Granulocytes: 2 %
Lymphocytes Relative: 14 %
Lymphs Abs: 0.9 10*3/uL (ref 0.7–4.0)
MCH: 32.7 pg (ref 26.0–34.0)
MCHC: 32.7 g/dL (ref 30.0–36.0)
MCV: 100 fL (ref 80.0–100.0)
Monocytes Absolute: 0.7 10*3/uL (ref 0.1–1.0)
Monocytes Relative: 12 %
Neutro Abs: 4.5 10*3/uL (ref 1.7–7.7)
Neutrophils Relative %: 72 %
Platelet Count: 328 10*3/uL (ref 150–400)
RBC: 3.85 MIL/uL — ABNORMAL LOW (ref 4.22–5.81)
RDW: 15.9 % — ABNORMAL HIGH (ref 11.5–15.5)
WBC Count: 6.2 10*3/uL (ref 4.0–10.5)
nRBC: 0 % (ref 0.0–0.2)

## 2020-07-26 LAB — MAGNESIUM: Magnesium: 1.8 mg/dL (ref 1.7–2.4)

## 2020-07-26 MED ORDER — PALONOSETRON HCL INJECTION 0.25 MG/5ML
INTRAVENOUS | Status: AC
  Filled 2020-07-26: qty 5

## 2020-07-26 MED ORDER — SODIUM CHLORIDE 0.9 % IV SOLN
65.0000 mg/m2 | Freq: Once | INTRAVENOUS | Status: AC
  Administered 2020-07-26: 120 mg via INTRAVENOUS
  Filled 2020-07-26: qty 6

## 2020-07-26 MED ORDER — DEXAMETHASONE SODIUM PHOSPHATE 100 MG/10ML IJ SOLN
10.0000 mg | Freq: Once | INTRAMUSCULAR | Status: AC
  Administered 2020-07-26: 10 mg via INTRAVENOUS
  Filled 2020-07-26: qty 10

## 2020-07-26 MED ORDER — SODIUM CHLORIDE 0.9 % IV SOLN
150.0000 mg | Freq: Once | INTRAVENOUS | Status: AC
  Administered 2020-07-26: 150 mg via INTRAVENOUS
  Filled 2020-07-26: qty 150

## 2020-07-26 MED ORDER — PALONOSETRON HCL INJECTION 0.25 MG/5ML
0.2500 mg | Freq: Once | INTRAVENOUS | Status: AC
  Administered 2020-07-26: 0.25 mg via INTRAVENOUS

## 2020-07-26 MED ORDER — ATROPINE SULFATE 1 MG/ML IJ SOLN
INTRAMUSCULAR | Status: AC
  Filled 2020-07-26: qty 1

## 2020-07-26 MED ORDER — HEPARIN SOD (PORK) LOCK FLUSH 100 UNIT/ML IV SOLN
500.0000 [IU] | Freq: Once | INTRAVENOUS | Status: AC | PRN
  Administered 2020-07-26: 500 [IU]
  Filled 2020-07-26: qty 5

## 2020-07-26 MED ORDER — SODIUM CHLORIDE 0.9% FLUSH
10.0000 mL | INTRAVENOUS | Status: DC | PRN
  Administered 2020-07-26: 10 mL
  Filled 2020-07-26: qty 10

## 2020-07-26 MED ORDER — SODIUM CHLORIDE 0.9 % IV SOLN
30.0000 mg/m2 | Freq: Once | INTRAVENOUS | Status: AC
  Administered 2020-07-26: 55 mg via INTRAVENOUS
  Filled 2020-07-26: qty 55

## 2020-07-26 MED ORDER — SODIUM CHLORIDE 0.9 % IV SOLN
Freq: Once | INTRAVENOUS | Status: AC
  Filled 2020-07-26: qty 250

## 2020-07-26 MED ORDER — ATROPINE SULFATE 1 MG/ML IJ SOLN
0.5000 mg | Freq: Once | INTRAMUSCULAR | Status: AC | PRN
  Administered 2020-07-26: 0.5 mg via INTRAVENOUS

## 2020-07-26 MED ORDER — MAGNESIUM SULFATE 2 GM/50ML IV SOLN
2.0000 g | Freq: Once | INTRAVENOUS | Status: DC
  Filled 2020-07-26: qty 50

## 2020-07-26 MED ORDER — MAGNESIUM SULFATE 50 % IJ SOLN
Freq: Once | INTRAMUSCULAR | Status: AC
Start: 2020-07-26 — End: 2020-07-26
  Filled 2020-07-26: qty 10

## 2020-07-26 NOTE — Progress Notes (Signed)
CBC and CMEt reviewed by MD,Ok to treat despite counts.

## 2020-07-26 NOTE — Patient Instructions (Signed)
[facility] [place]  Discharge Instructions: Thank you for choosing [facility] to provide your oncology and hematology care.   If you have a lab appointment with the [facility], please go directly to the [facility] and check in at the registration area.  Wear comfortable clothing and clothing appropriate for easy access to any Portacath or PICC line.   We strive to give you quality time with your provider. You may need to reschedule your appointment if you arrive late (15 or more minutes).  Arriving late affects you and other patients whose appointments are after yours.  Also, if you miss three or more appointments without notifying the office, you may be dismissed from the clinic at the provider's discretion.      For prescription refill requests, have your pharmacy contact our office and allow 72 hours for refills to be completed.    Today you received the following chemotherapy and/or immunotherapy agents cisplatin, cpt-11   To help prevent nausea and vomiting after your treatment, we encourage you to take your nausea medication as directed.  BELOW ARE SYMPTOMS THAT SHOULD BE REPORTED IMMEDIATELY: *FEVER GREATER THAN 100.4 F (38 C) OR HIGHER *CHILLS OR SWEATING *NAUSEA AND VOMITING THAT IS NOT CONTROLLED WITH YOUR NAUSEA MEDICATION *UNUSUAL SHORTNESS OF BREATH *UNUSUAL BRUISING OR BLEEDING *URINARY PROBLEMS (pain or burning when urinating, or frequent urination) *BOWEL PROBLEMS (unusual diarrhea, constipation, pain near the anus) TENDERNESS IN MOUTH AND THROAT WITH OR WITHOUT PRESENCE OF ULCERS (sore throat, sores in mouth, or a toothache) UNUSUAL RASH, SWELLING OR PAIN  UNUSUAL VAGINAL DISCHARGE OR ITCHING   Items with * indicate a potential emergency and should be followed up as soon as possible or go to the Emergency Department if any problems should occur.  Please show the CHEMOTHERAPY ALERT CARD or IMMUNOTHERAPY ALERT CARD at check-in to  the Emergency Department and triage nurse. Should you have questions after your visit or need to cancel or reschedule your appointment, please contact [facility]  [phone] and follow the prompts.  Office hours are 8:00 a.m. to 4:30 p.m. Monday - Friday. Please note that voicemails left after 4:00 p.m. may not be returned until the following business day.  We are closed weekends and major holidays. You have access to a nurse at all times for urgent questions. Please call the main number to the clinic [phone] and follow the prompts.  For any non-urgent questions, you may also contact your provider using MyChart. We now offer e-Visits for anyone 34 and older to request care online for non-urgent symptoms. For details visit [email or web address].   Also download the MyChart app! Go to the app store, search "MyChart", open the app, select [facility], and log in with your MyChart username and password.  Due to Covid, a mask is required upon entering the hospital/clinic. If you do not have a mask, one will be given to you upon arrival. For doctor visits, patients may have 1 support person aged 16 or older with them. For treatment visits, patients cannot have anyone with them due to current Covid guidelines and our immunocompromised population.

## 2020-07-26 NOTE — Progress Notes (Signed)
Dr. Marin Olp has assessed today's SCr and CrCl. He will leave cisplatin at the current dose.

## 2020-07-26 NOTE — Progress Notes (Signed)
UO 700cc. Treatment released

## 2020-07-27 ENCOUNTER — Other Ambulatory Visit: Payer: Self-pay | Admitting: Hematology & Oncology

## 2020-08-04 DIAGNOSIS — H524 Presbyopia: Secondary | ICD-10-CM | POA: Diagnosis not present

## 2020-08-04 DIAGNOSIS — H52223 Regular astigmatism, bilateral: Secondary | ICD-10-CM | POA: Diagnosis not present

## 2020-08-04 DIAGNOSIS — M9904 Segmental and somatic dysfunction of sacral region: Secondary | ICD-10-CM | POA: Diagnosis not present

## 2020-08-04 DIAGNOSIS — H25813 Combined forms of age-related cataract, bilateral: Secondary | ICD-10-CM | POA: Diagnosis not present

## 2020-08-04 DIAGNOSIS — M9902 Segmental and somatic dysfunction of thoracic region: Secondary | ICD-10-CM | POA: Diagnosis not present

## 2020-08-04 DIAGNOSIS — M9901 Segmental and somatic dysfunction of cervical region: Secondary | ICD-10-CM | POA: Diagnosis not present

## 2020-08-04 DIAGNOSIS — M9903 Segmental and somatic dysfunction of lumbar region: Secondary | ICD-10-CM | POA: Diagnosis not present

## 2020-08-05 ENCOUNTER — Other Ambulatory Visit (HOSPITAL_BASED_OUTPATIENT_CLINIC_OR_DEPARTMENT_OTHER): Payer: Self-pay

## 2020-08-05 MED FILL — Pantoprazole Sodium EC Tab 40 MG (Base Equiv): ORAL | 30 days supply | Qty: 30 | Fill #3 | Status: AC

## 2020-08-11 ENCOUNTER — Other Ambulatory Visit (HOSPITAL_BASED_OUTPATIENT_CLINIC_OR_DEPARTMENT_OTHER): Payer: Self-pay

## 2020-08-12 DIAGNOSIS — M9902 Segmental and somatic dysfunction of thoracic region: Secondary | ICD-10-CM | POA: Diagnosis not present

## 2020-08-12 DIAGNOSIS — M9903 Segmental and somatic dysfunction of lumbar region: Secondary | ICD-10-CM | POA: Diagnosis not present

## 2020-08-12 DIAGNOSIS — M9904 Segmental and somatic dysfunction of sacral region: Secondary | ICD-10-CM | POA: Diagnosis not present

## 2020-08-12 DIAGNOSIS — M9901 Segmental and somatic dysfunction of cervical region: Secondary | ICD-10-CM | POA: Diagnosis not present

## 2020-08-16 ENCOUNTER — Encounter: Payer: Self-pay | Admitting: Hematology & Oncology

## 2020-08-16 ENCOUNTER — Inpatient Hospital Stay (HOSPITAL_BASED_OUTPATIENT_CLINIC_OR_DEPARTMENT_OTHER): Payer: Medicare Other | Admitting: Hematology & Oncology

## 2020-08-16 ENCOUNTER — Other Ambulatory Visit: Payer: Self-pay

## 2020-08-16 ENCOUNTER — Inpatient Hospital Stay: Payer: Medicare Other

## 2020-08-16 ENCOUNTER — Other Ambulatory Visit: Payer: Self-pay | Admitting: Hematology & Oncology

## 2020-08-16 ENCOUNTER — Inpatient Hospital Stay: Payer: Medicare Other | Attending: Hematology & Oncology

## 2020-08-16 ENCOUNTER — Other Ambulatory Visit (HOSPITAL_BASED_OUTPATIENT_CLINIC_OR_DEPARTMENT_OTHER): Payer: Self-pay

## 2020-08-16 VITALS — BP 134/76 | HR 102 | Temp 98.2°F | Resp 18 | Wt 163.0 lb

## 2020-08-16 DIAGNOSIS — Z5111 Encounter for antineoplastic chemotherapy: Secondary | ICD-10-CM | POA: Insufficient documentation

## 2020-08-16 DIAGNOSIS — Z79899 Other long term (current) drug therapy: Secondary | ICD-10-CM | POA: Insufficient documentation

## 2020-08-16 DIAGNOSIS — C7B8 Other secondary neuroendocrine tumors: Secondary | ICD-10-CM

## 2020-08-16 DIAGNOSIS — D509 Iron deficiency anemia, unspecified: Secondary | ICD-10-CM | POA: Insufficient documentation

## 2020-08-16 DIAGNOSIS — C801 Malignant (primary) neoplasm, unspecified: Secondary | ICD-10-CM

## 2020-08-16 DIAGNOSIS — K909 Intestinal malabsorption, unspecified: Secondary | ICD-10-CM | POA: Diagnosis not present

## 2020-08-16 DIAGNOSIS — M2550 Pain in unspecified joint: Secondary | ICD-10-CM | POA: Diagnosis not present

## 2020-08-16 DIAGNOSIS — C7951 Secondary malignant neoplasm of bone: Secondary | ICD-10-CM

## 2020-08-16 DIAGNOSIS — C7A8 Other malignant neuroendocrine tumors: Secondary | ICD-10-CM | POA: Diagnosis not present

## 2020-08-16 DIAGNOSIS — R531 Weakness: Secondary | ICD-10-CM | POA: Diagnosis not present

## 2020-08-16 DIAGNOSIS — G63 Polyneuropathy in diseases classified elsewhere: Secondary | ICD-10-CM

## 2020-08-16 DIAGNOSIS — C61 Malignant neoplasm of prostate: Secondary | ICD-10-CM | POA: Insufficient documentation

## 2020-08-16 LAB — CMP (CANCER CENTER ONLY)
ALT: 15 U/L (ref 0–44)
AST: 15 U/L (ref 15–41)
Albumin: 3.9 g/dL (ref 3.5–5.0)
Alkaline Phosphatase: 59 U/L (ref 38–126)
Anion gap: 10 (ref 5–15)
BUN: 22 mg/dL (ref 8–23)
CO2: 26 mmol/L (ref 22–32)
Calcium: 9.1 mg/dL (ref 8.9–10.3)
Chloride: 102 mmol/L (ref 98–111)
Creatinine: 1.55 mg/dL — ABNORMAL HIGH (ref 0.61–1.24)
GFR, Estimated: 47 mL/min — ABNORMAL LOW (ref >60)
Glucose, Bld: 158 mg/dL — ABNORMAL HIGH (ref 70–99)
Potassium: 4 mmol/L (ref 3.5–5.1)
Sodium: 138 mmol/L (ref 135–145)
Total Bilirubin: 0.2 mg/dL — ABNORMAL LOW (ref 0.3–1.2)
Total Protein: 6.2 g/dL — ABNORMAL LOW (ref 6.5–8.1)

## 2020-08-16 LAB — CBC WITH DIFFERENTIAL (CANCER CENTER ONLY)
Abs Immature Granulocytes: 0.02 10*3/uL (ref 0.00–0.07)
Basophils Absolute: 0 10*3/uL (ref 0.0–0.1)
Basophils Relative: 0 %
Eosinophils Absolute: 0 10*3/uL (ref 0.0–0.5)
Eosinophils Relative: 1 %
HCT: 32.7 % — ABNORMAL LOW (ref 39.0–52.0)
Hemoglobin: 10.5 g/dL — ABNORMAL LOW (ref 13.0–17.0)
Immature Granulocytes: 1 %
Lymphocytes Relative: 16 %
Lymphs Abs: 0.4 10*3/uL — ABNORMAL LOW (ref 0.7–4.0)
MCH: 32.5 pg (ref 26.0–34.0)
MCHC: 32.1 g/dL (ref 30.0–36.0)
MCV: 101.2 fL — ABNORMAL HIGH (ref 80.0–100.0)
Monocytes Absolute: 0.5 10*3/uL (ref 0.1–1.0)
Monocytes Relative: 16 %
Neutro Abs: 1.9 10*3/uL (ref 1.7–7.7)
Neutrophils Relative %: 66 %
Platelet Count: 285 10*3/uL (ref 150–400)
RBC: 3.23 MIL/uL — ABNORMAL LOW (ref 4.22–5.81)
RDW: 16.4 % — ABNORMAL HIGH (ref 11.5–15.5)
WBC Count: 2.8 10*3/uL — ABNORMAL LOW (ref 4.0–10.5)
nRBC: 0 % (ref 0.0–0.2)

## 2020-08-16 LAB — TYPE AND SCREEN
ABO/RH(D): O POS
Antibody Screen: NEGATIVE
Unit division: 0
Unit division: 0

## 2020-08-16 LAB — BPAM RBC
Blood Product Expiration Date: 202209032359
Blood Product Expiration Date: 202209052359
Unit Type and Rh: 5100
Unit Type and Rh: 5100

## 2020-08-16 LAB — RETICULOCYTES
Immature Retic Fract: 20.2 % — ABNORMAL HIGH (ref 2.3–15.9)
RBC.: 3.24 MIL/uL — ABNORMAL LOW (ref 4.22–5.81)
Retic Count, Absolute: 45.4 10*3/uL (ref 19.0–186.0)
Retic Ct Pct: 1.4 % (ref 0.4–3.1)

## 2020-08-16 LAB — MAGNESIUM: Magnesium: 2.2 mg/dL (ref 1.7–2.4)

## 2020-08-16 LAB — SAMPLE TO BLOOD BANK

## 2020-08-16 LAB — PREPARE RBC (CROSSMATCH)

## 2020-08-16 LAB — LACTATE DEHYDROGENASE: LDH: 176 U/L (ref 98–192)

## 2020-08-16 MED ORDER — SODIUM CHLORIDE 0.9 % IV SOLN
Freq: Once | INTRAVENOUS | Status: AC
  Filled 2020-08-16: qty 250

## 2020-08-16 MED ORDER — SODIUM CHLORIDE 0.9 % IV SOLN
150.0000 mg | Freq: Once | INTRAVENOUS | Status: AC
  Administered 2020-08-16: 150 mg via INTRAVENOUS
  Filled 2020-08-16: qty 150

## 2020-08-16 MED ORDER — POTASSIUM CHLORIDE IN NACL 20-0.9 MEQ/L-% IV SOLN
Freq: Once | INTRAVENOUS | Status: AC
  Filled 2020-08-16: qty 1000

## 2020-08-16 MED ORDER — SODIUM CHLORIDE 0.9% FLUSH
10.0000 mL | INTRAVENOUS | Status: DC | PRN
  Administered 2020-08-16: 10 mL
  Filled 2020-08-16: qty 10

## 2020-08-16 MED ORDER — ATROPINE SULFATE 1 MG/ML IJ SOLN
0.5000 mg | Freq: Once | INTRAMUSCULAR | Status: AC | PRN
  Administered 2020-08-16: 0.5 mg via INTRAVENOUS

## 2020-08-16 MED ORDER — MAGNESIUM SULFATE 2 GM/50ML IV SOLN
2.0000 g | Freq: Once | INTRAVENOUS | Status: AC
  Administered 2020-08-16: 2 g via INTRAVENOUS
  Filled 2020-08-16: qty 50

## 2020-08-16 MED ORDER — GABAPENTIN 300 MG PO CAPS
ORAL_CAPSULE | ORAL | 2 refills | Status: DC
Start: 2020-08-16 — End: 2020-10-19
  Filled 2020-08-16: qty 120, 60d supply, fill #0

## 2020-08-16 MED ORDER — PALONOSETRON HCL INJECTION 0.25 MG/5ML
INTRAVENOUS | Status: AC
  Filled 2020-08-16: qty 5

## 2020-08-16 MED ORDER — PALONOSETRON HCL INJECTION 0.25 MG/5ML
0.2500 mg | Freq: Once | INTRAVENOUS | Status: AC
  Administered 2020-08-16: 0.25 mg via INTRAVENOUS

## 2020-08-16 MED ORDER — HEPARIN SOD (PORK) LOCK FLUSH 100 UNIT/ML IV SOLN
500.0000 [IU] | Freq: Once | INTRAVENOUS | Status: AC | PRN
  Administered 2020-08-16: 500 [IU]
  Filled 2020-08-16: qty 5

## 2020-08-16 MED ORDER — ATROPINE SULFATE 1 MG/ML IJ SOLN
INTRAMUSCULAR | Status: AC
  Filled 2020-08-16: qty 1

## 2020-08-16 MED ORDER — SODIUM CHLORIDE 0.9 % IV SOLN
10.0000 mg | Freq: Once | INTRAVENOUS | Status: AC
  Administered 2020-08-16: 10 mg via INTRAVENOUS
  Filled 2020-08-16: qty 10

## 2020-08-16 MED ORDER — SODIUM CHLORIDE 0.9 % IV SOLN
65.0000 mg/m2 | Freq: Once | INTRAVENOUS | Status: AC
  Administered 2020-08-16: 120 mg via INTRAVENOUS
  Filled 2020-08-16: qty 6

## 2020-08-16 MED ORDER — SODIUM CHLORIDE 0.9 % IV SOLN
30.0000 mg/m2 | Freq: Once | INTRAVENOUS | Status: AC
  Administered 2020-08-16: 55 mg via INTRAVENOUS
  Filled 2020-08-16: qty 55

## 2020-08-16 NOTE — Progress Notes (Signed)
Hematology and Oncology Follow Up Visit  [name] [id number] [birth year] 75 y.o. 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 Iron def anemia -- malabsorption   Past Therapy: Zytiga 1000 mg by mouth daily - discontinued on 08/15/2016 Xtandi 160 mg po q day - start on 08/15/2016 - discontinued Radium-223 therapy -- s/p cycle #6 Palliative radiation to the right sacrum Lutathera - s/p cycle 4/4 --last dose on 07/11/2017 Carbo/VP-16/Tecentriq --  S/p cycle #5 Keytruda q 6 week -- Maintanence -- s/p cycle #3 - changed from 3 to 6 week on 06/30/2018 Taxotere/Keytruda -- start on 09/17/2018 -- s/p cycle #10 -- d/c due to progression   Current Therapy:        Lurbinectedin -- started on 04/29/2019, s/p cycle 17 -- d/c on 05/2020 due to progression CDDP/Irinotecan -- s/p cycle #2 -- start on 06/15/2020 Xgeva 120 mg subcutaneous Q3 months - due in 10/2020 Lupron 22 mg IM every 3 months - due in 10/2020  SBRT to sacral lesion  IV iron as indicated    Interim History:  [name] is here today for follow-up.  So far, he is tolerating treatment pretty well.  He had 2 cycles of treatment.  After this cycle upcoming, we will go ahead and repeat his scan to see how everything looks.  He had a little bit of diarrhea.  This was last week.  This is improved.  He has had no problems with fever.  He has had no problems with cough or shortness of breath.  There is been no bleeding.  He has had no issues with urinating.  His pain seems to be doing pretty well.  He has had no headache.  He has performed status of ECOG 1.   Medications:  Allergies as of 08/16/2020       Reactions   Codeine Nausea And Vomiting   Hydrocodone Nausea And Vomiting        Medication List        Accurate as of August 16, 2020 12:49 PM. If you have any questions, ask your nurse or doctor.           acetaminophen 500 MG tablet Commonly known as: TYLENOL Take 1 tablet (500 mg total) by mouth every 4 (four) hours as needed for mild pain, headache or fever (fever >/= 101).   Anti-Diarrheal 2 MG tablet Generic drug: loperamide Take 2 tablets by mouth at diarrhea onset, then 1 tablet every 2 hours until 12 hours with no bowel movment. May take 2 tablets every 4 hours at night. If diarrhea recurs repeat.   ascorbic acid 500 MG tablet Commonly known as: VITAMIN C Take 1 tablet (500 mg total) by mouth daily.   aspirin 81 MG tablet Take 81 mg by mouth daily.   dexamethasone 4 MG tablet Commonly known as: DECADRON Take 4 mg by mouth daily. X 3 days   dexamethasone 4 MG tablet Commonly known as: DECADRON Take 2 tablets (8 mg total) by mouth daily for 3 days starting the day after cisplatin chemotherapy. Take with food.   ezetimibe 10 MG tablet Commonly known as: ZETIA TAKE 1 TABLET BY MOUTH DAILY What changed: how much to take   gabapentin 300 MG capsule Commonly known as: NEURONTIN TAKE 1 CAPSULE BY MOUTH TWICE A DAY THEN TAKE 2 CAPSULES AT BEDTIME What changed:  how much to take how to take this when to take  this   Generlac 10 GM/15ML Soln Generic drug: lactulose (encephalopathy) Take 15 mLs (10 g total) by mouth daily as needed for constipation.   isosorbide mononitrate 30 MG 24 hr tablet Commonly known as: IMDUR TAKE 1 TABLET (30 MG TOTAL) BY MOUTH DAILY. What changed:  how much to take when to take this   lidocaine-prilocaine cream Commonly known as: EMLA Apply to affected area once   LORazepam 0.5 MG tablet Commonly known as: Ativan Take 1 tablet (0.5 mg total) by mouth every 6 (six) hours as needed (Nausea or vomiting).   ondansetron 8 MG tablet Commonly known as: Zofran Take 1 tablet (8 mg total) by mouth 2 (two) times daily as needed. Start on the third day after cisplatin chemotherapy.   OVER THE COUNTER MEDICATION every morning. Juice Plus -- 8  capsule of each the garden, vineyard, and [facility] twice a day.   pantoprazole 40 MG tablet Commonly known as: PROTONIX TAKE 1 TABLET (40 MG TOTAL) BY MOUTH AT BEDTIME.   predniSONE 10 MG tablet Commonly known as: DELTASONE Take 5 tablets by mouth on day 1 then, 4 tablets on day 2, then 3 tabs on day 3, then 2 tabs on day 4, then 1 tab on day 5, then STOP.   prochlorperazine 10 MG tablet Commonly known as: COMPAZINE Take 1 tablet (10 mg total) by mouth every 6 (six) hours as needed for nausea or vomiting.   prochlorperazine 10 MG tablet Commonly known as: COMPAZINE Take 1 tablet (10 mg total) by mouth every 6 (six) hours as needed for nausea or vomiting.   Zinc 50 MG Caps Take 50 mg by mouth daily.        Allergies:  Allergies  Allergen Reactions   Codeine Nausea And Vomiting   Hydrocodone Nausea And Vomiting    Past Medical History, Surgical history, Social history, and Family History were reviewed and updated.  Review of Systems: Review of Systems  Constitutional: Negative.   HENT: Negative.    Eyes: Negative.   Respiratory: Negative.    Cardiovascular: Negative.   Gastrointestinal: Negative.   Genitourinary: Negative.   Musculoskeletal:  Positive for joint pain.  Skin: Negative.   Neurological:  Positive for focal weakness.  Endo/Heme/Allergies: Negative.   Psychiatric/Behavioral: Negative.      Physical Exam:  weight is 163 lb (73.9 kg). His oral temperature is 98.2 F (36.8 C). His blood pressure is 134/76 and his pulse is 102 (abnormal). His respiration is 18 and oxygen saturation is 98%.   Wt Readings from Last 3 Encounters:  08/16/20 163 lb (73.9 kg)  07/11/20 149 lb 0.6 oz (67.6 kg)  07/11/20 149 lb 4 oz (67.7 kg)    Physical Exam Vitals reviewed.  HENT:     Head: Normocephalic and atraumatic.  Eyes:     Pupils: Pupils are equal, round, and reactive to light.  Cardiovascular:     Rate and Rhythm: Normal rate and regular rhythm.     Heart  sounds: Normal heart sounds.  Pulmonary:     Effort: Pulmonary effort is normal.     Breath sounds: Normal breath sounds.  Abdominal:     General: Bowel sounds are normal.     Palpations: Abdomen is soft.  Musculoskeletal:        General: No tenderness or deformity. Normal range of motion.     Cervical back: Normal range of motion.  Lymphadenopathy:     Cervical: No cervical adenopathy.  Skin:    General:  Skin is warm and dry.     Findings: No erythema or rash.  Neurological:     Mental Status: He is alert and oriented to person, place, and time.  Psychiatric:        Behavior: Behavior normal.        Thought Content: Thought content normal.        Judgment: Judgment normal.   Lab Results  Component Value Date   WBC 2.8 (L) 08/16/2020   HGB 10.5 (L) 08/16/2020   HCT 32.7 (L) 08/16/2020   MCV 101.2 (H) 08/16/2020   PLT 285 08/16/2020   Lab Results  Component Value Date   FERRITIN 471 (H) 07/11/2020   IRON 65 07/11/2020   TIBC 221 07/11/2020   UIBC 157 07/11/2020   IRONPCTSAT 29 07/11/2020   Lab Results  Component Value Date   RETICCTPCT 1.4 08/16/2020   RBC 3.24 (L) 08/16/2020   No results found for: KPAFRELGTCHN, LAMBDASER, KAPLAMBRATIO No results found for: IGGSERUM, IGA, IGMSERUM No results found for: Odetta Pink, SPEI   Chemistry      Component Value Date/Time   NA 138 08/16/2020 0823   NA 144 12/14/2016 1159   NA 140 10/22/2016 1129   K 4.0 08/16/2020 0823   K 3.5 12/14/2016 1159   K 4.3 10/22/2016 1129   CL 102 08/16/2020 0823   CL 103 12/14/2016 1159   CO2 26 08/16/2020 0823   CO2 30 12/14/2016 1159   CO2 27 10/22/2016 1129   BUN 22 08/16/2020 0823   BUN 16 12/14/2016 1159   BUN 14.0 10/22/2016 1129   CREATININE 1.55 (H) 08/16/2020 0823   CREATININE 1.3 (H) 12/14/2016 1159   CREATININE 1.1 10/22/2016 1129      Component Value Date/Time   CALCIUM 9.1 08/16/2020 0823   CALCIUM 10.0  12/14/2016 1159   CALCIUM 10.4 10/22/2016 1129   ALKPHOS 59 08/16/2020 0823   ALKPHOS 48 12/14/2016 1159   ALKPHOS 60 10/22/2016 1129   AST 15 08/16/2020 0823   AST 16 10/22/2016 1129   ALT 15 08/16/2020 0823   ALT 23 12/14/2016 1159   ALT 14 10/22/2016 1129   BILITOT 0.2 (L) 08/16/2020 0823   BILITOT 0.31 10/22/2016 1129       Impression and Plan: [name] is 75 yo caucasian gentleman with metastatic neuroendocrine carcinoma of unknown primary.  Everything has been holding pretty steady except for down of the sacral area.  We have given him some localized radiation therapy to this area to help prevent problems.  So far it seems to be helping.  Hopefully, the new protocol with cisplatin/irinotecan is helping.  This will be his third cycle.  As such, we have to go ahead and get a another PET scan on him.    I am just glad that he is doing well.  He seems to be responding from a clinical point of view.  I will plan to do the PET scan in about 3 weeks.  Again, this is all about quality of life.  Hopefully, we will see that he is responding and has maintained a good quality of life.     8/2/202212:49 PM

## 2020-08-16 NOTE — Patient Instructions (Signed)
[facility] [place]  Discharge Instructions: Thank you for choosing [facility] to provide your oncology and hematology care.   If you have a lab appointment with the [facility], please go directly to the [facility] and check in at the registration area.  Wear comfortable clothing and clothing appropriate for easy access to any Portacath or PICC line.   We strive to give you quality time with your provider. You may need to reschedule your appointment if you arrive late (15 or more minutes).  Arriving late affects you and other patients whose appointments are after yours.  Also, if you miss three or more appointments without notifying the office, you may be dismissed from the clinic at the provider's discretion.      For prescription refill requests, have your pharmacy contact our office and allow 72 hours for refills to be completed.    Today you received the following chemotherapy and/or immunotherapy agents cisplatin, cpt-11   To help prevent nausea and vomiting after your treatment, we encourage you to take your nausea medication as directed.  BELOW ARE SYMPTOMS THAT SHOULD BE REPORTED IMMEDIATELY: *FEVER GREATER THAN 100.4 F (38 C) OR HIGHER *CHILLS OR SWEATING *NAUSEA AND VOMITING THAT IS NOT CONTROLLED WITH YOUR NAUSEA MEDICATION *UNUSUAL SHORTNESS OF BREATH *UNUSUAL BRUISING OR BLEEDING *URINARY PROBLEMS (pain or burning when urinating, or frequent urination) *BOWEL PROBLEMS (unusual diarrhea, constipation, pain near the anus) TENDERNESS IN MOUTH AND THROAT WITH OR WITHOUT PRESENCE OF ULCERS (sore throat, sores in mouth, or a toothache) UNUSUAL RASH, SWELLING OR PAIN  UNUSUAL VAGINAL DISCHARGE OR ITCHING   Items with * indicate a potential emergency and should be followed up as soon as possible or go to the Emergency Department if any problems should occur.  Please show the CHEMOTHERAPY ALERT CARD or IMMUNOTHERAPY ALERT CARD at check-in to  the Emergency Department and triage nurse. Should you have questions after your visit or need to cancel or reschedule your appointment, please contact [facility]  [phone] and follow the prompts.  Office hours are 8:00 a.m. to 4:30 p.m. Monday - Friday. Please note that voicemails left after 4:00 p.m. may not be returned until the following business day.  We are closed weekends and major holidays. You have access to a nurse at all times for urgent questions. Please call the main number to the clinic [phone] and follow the prompts.  For any non-urgent questions, you may also contact your provider using MyChart. We now offer e-Visits for anyone 54 and older to request care online for non-urgent symptoms. For details visit [email or web address].   Also download the MyChart app! Go to the app store, search "MyChart", open the app, select [facility], and log in with your MyChart username and password.  Due to Covid, a mask is required upon entering the hospital/clinic. If you do not have a mask, one will be given to you upon arrival. For doctor visits, patients may have 1 support person aged 31 or older with them. For treatment visits, patients cannot have anyone with them due to current Covid guidelines and our immunocompromised population.

## 2020-08-17 ENCOUNTER — Other Ambulatory Visit (HOSPITAL_BASED_OUTPATIENT_CLINIC_OR_DEPARTMENT_OTHER): Payer: Self-pay

## 2020-08-17 LAB — CHROMOGRANIN A: Chromogranin A (ng/mL): 1203 ng/mL — ABNORMAL HIGH (ref 0.0–101.8)

## 2020-08-18 ENCOUNTER — Other Ambulatory Visit: Payer: Medicare Other

## 2020-08-18 ENCOUNTER — Ambulatory Visit: Payer: Medicare Other

## 2020-08-18 ENCOUNTER — Ambulatory Visit: Payer: Medicare Other | Admitting: Hematology & Oncology

## 2020-08-18 DIAGNOSIS — Z9181 History of falling: Secondary | ICD-10-CM | POA: Diagnosis not present

## 2020-08-18 DIAGNOSIS — Z0000 Encounter for general adult medical examination without abnormal findings: Secondary | ICD-10-CM | POA: Diagnosis not present

## 2020-08-18 DIAGNOSIS — E785 Hyperlipidemia, unspecified: Secondary | ICD-10-CM | POA: Diagnosis not present

## 2020-08-18 DIAGNOSIS — Z1331 Encounter for screening for depression: Secondary | ICD-10-CM | POA: Diagnosis not present

## 2020-08-19 ENCOUNTER — Emergency Department: Payer: Self-pay

## 2020-08-19 ENCOUNTER — Observation Stay
Admission: EM | Admit: 2020-08-19 | Discharge: 2020-08-21 | Disposition: A | Discharge status: Home / Self Care | Payer: Self-pay | Attending: Hospitalist | Admitting: Hospitalist

## 2020-08-19 DIAGNOSIS — I4891 Unspecified atrial fibrillation: Secondary | ICD-10-CM | POA: Diagnosis present

## 2020-08-19 DIAGNOSIS — Z7982 Long term (current) use of aspirin: Secondary | ICD-10-CM | POA: Insufficient documentation

## 2020-08-19 DIAGNOSIS — E785 Hyperlipidemia, unspecified: Secondary | ICD-10-CM | POA: Insufficient documentation

## 2020-08-19 DIAGNOSIS — Z5900 Homelessness unspecified: Secondary | ICD-10-CM | POA: Insufficient documentation

## 2020-08-19 DIAGNOSIS — G8929 Other chronic pain: Secondary | ICD-10-CM | POA: Insufficient documentation

## 2020-08-19 DIAGNOSIS — E1165 Type 2 diabetes mellitus with hyperglycemia: Secondary | ICD-10-CM | POA: Insufficient documentation

## 2020-08-19 DIAGNOSIS — E1169 Type 2 diabetes mellitus with other specified complication: Secondary | ICD-10-CM

## 2020-08-19 DIAGNOSIS — M545 Low back pain, unspecified: Secondary | ICD-10-CM

## 2020-08-19 DIAGNOSIS — I10 Essential (primary) hypertension: Secondary | ICD-10-CM | POA: Insufficient documentation

## 2020-08-19 DIAGNOSIS — M549 Dorsalgia, unspecified: Secondary | ICD-10-CM | POA: Insufficient documentation

## 2020-08-19 DIAGNOSIS — Z9114 Patient's other noncompliance with medication regimen: Secondary | ICD-10-CM | POA: Insufficient documentation

## 2020-08-19 DIAGNOSIS — F17210 Nicotine dependence, cigarettes, uncomplicated: Secondary | ICD-10-CM | POA: Insufficient documentation

## 2020-08-19 DIAGNOSIS — I480 Paroxysmal atrial fibrillation: Principal | ICD-10-CM | POA: Insufficient documentation

## 2020-08-19 DIAGNOSIS — Z794 Long term (current) use of insulin: Secondary | ICD-10-CM | POA: Insufficient documentation

## 2020-08-19 DIAGNOSIS — Z79899 Other long term (current) drug therapy: Secondary | ICD-10-CM | POA: Insufficient documentation

## 2020-08-19 DIAGNOSIS — Z7901 Long term (current) use of anticoagulants: Secondary | ICD-10-CM | POA: Insufficient documentation

## 2020-08-19 LAB — COMPREHENSIVE METABOLIC PANEL
ALT: 14 U/L (ref 0–55)
AST (SGOT): 17 U/L (ref 5–34)
Albumin/Globulin Ratio: 1.2 (ref 0.9–2.2)
Albumin: 3.6 g/dL (ref 3.5–5.0)
Alkaline Phosphatase: 129 U/L — ABNORMAL HIGH (ref 37–117)
Anion Gap: 12 (ref 5.0–15.0)
BUN: 13 mg/dL (ref 9.0–28.0)
Bilirubin, Total: 0.5 mg/dL (ref 0.2–1.2)
CO2: 21 mEq/L — ABNORMAL LOW (ref 22–29)
Calcium: 9.1 mg/dL (ref 7.9–10.2)
Chloride: 102 mEq/L (ref 100–111)
Creatinine: 1.2 mg/dL (ref 0.7–1.3)
Globulin: 3 g/dL (ref 2.0–3.6)
Glucose: 301 mg/dL — ABNORMAL HIGH (ref 70–100)
Potassium: 4.5 mEq/L (ref 3.5–5.1)
Protein, Total: 6.6 g/dL (ref 6.0–8.3)
Sodium: 135 mEq/L — ABNORMAL LOW (ref 136–145)

## 2020-08-19 LAB — CBC AND DIFFERENTIAL
Absolute NRBC: 0 10*3/uL (ref 0.00–0.00)
Basophils Absolute Automated: 0.17 10*3/uL — ABNORMAL HIGH (ref 0.00–0.08)
Basophils Automated: 1.6 %
Eosinophils Absolute Automated: 0.97 10*3/uL — ABNORMAL HIGH (ref 0.00–0.44)
Eosinophils Automated: 9.3 %
Hematocrit: 47.2 % (ref 37.6–49.6)
Hgb: 15.8 g/dL (ref 12.5–17.1)
Immature Granulocytes Absolute: 0.04 10*3/uL (ref 0.00–0.07)
Immature Granulocytes: 0.4 %
Lymphocytes Absolute Automated: 2.72 10*3/uL (ref 0.42–3.22)
Lymphocytes Automated: 26 %
MCH: 28 pg (ref 25.1–33.5)
MCHC: 33.5 g/dL (ref 31.5–35.8)
MCV: 83.7 fL (ref 78.0–96.0)
MPV: 9.6 fL (ref 8.9–12.5)
Monocytes Absolute Automated: 1.49 10*3/uL — ABNORMAL HIGH (ref 0.21–0.85)
Monocytes: 14.2 %
Neutrophils Absolute: 5.08 10*3/uL (ref 1.10–6.33)
Neutrophils: 48.5 %
Nucleated RBC: 0 /100 WBC (ref 0.0–0.0)
Platelets: 195 10*3/uL (ref 142–346)
RBC: 5.64 10*6/uL (ref 4.20–5.90)
RDW: 13 % (ref 11–15)
WBC: 10.47 10*3/uL — ABNORMAL HIGH (ref 3.10–9.50)

## 2020-08-19 LAB — URINALYSIS REFLEX TO MICROSCOPIC EXAM - REFLEX TO CULTURE
Bilirubin, UA: NEGATIVE
Ketones UA: NEGATIVE
Leukocyte Esterase, UA: NEGATIVE
Nitrite, UA: NEGATIVE
Specific Gravity UA: 1.019 (ref 1.001–1.035)
Urine pH: 5.5 (ref 5.0–8.0)
Urobilinogen, UA: NORMAL mg/dL (ref 0.2–2.0)

## 2020-08-19 LAB — PT/INR
PT INR: 0.9 (ref 0.9–1.1)
PT: 10.6 s (ref 10.1–12.9)

## 2020-08-19 LAB — B-TYPE NATRIURETIC PEPTIDE: B-Natriuretic Peptide: 52 pg/mL (ref 0–100)

## 2020-08-19 LAB — GFR: EGFR: 59.4

## 2020-08-19 LAB — TROPONIN I: Troponin I: 0.01 ng/mL (ref 0.00–0.05)

## 2020-08-19 LAB — TSH: TSH: 0.54 u[IU]/mL (ref 0.35–4.94)

## 2020-08-19 LAB — MAGNESIUM: Magnesium: 1.6 mg/dL (ref 1.6–2.6)

## 2020-08-19 MED ORDER — NALOXONE HCL 0.4 MG/ML IJ SOLN (WRAP)
0.2000 mg | INTRAMUSCULAR | Status: DC | PRN
Start: 2020-08-19 — End: 2020-08-19

## 2020-08-19 MED ORDER — HYDROMORPHONE HCL 0.5 MG/0.5 ML IJ SOLN
0.2000 mg | INTRAMUSCULAR | Status: DC | PRN
Start: 2020-08-19 — End: 2020-08-21

## 2020-08-19 MED ORDER — DEXTROSE 10 % IV BOLUS
25.0000 g | INTRAVENOUS | Status: DC | PRN
Start: 2020-08-19 — End: 2020-08-21

## 2020-08-19 MED ORDER — POTASSIUM CHLORIDE 10 MEQ/100ML IV SOLN
10.0000 meq | INTRAVENOUS | Status: DC | PRN
Start: 2020-08-19 — End: 2020-08-21

## 2020-08-19 MED ORDER — METHOCARBAMOL 500 MG PO TABS
500.0000 mg | ORAL_TABLET | Freq: Four times a day (QID) | ORAL | Status: DC
Start: 2020-08-19 — End: 2020-08-21
  Administered 2020-08-19 – 2020-08-21 (×6): 500 mg via ORAL
  Filled 2020-08-19 (×6): qty 1

## 2020-08-19 MED ORDER — DILTIAZEM HCL 5 MG/ML IV SOLN (WRAP)
20.0000 mg | Freq: Once | INTRAVENOUS | Status: AC
Start: 2020-08-19 — End: 2020-08-19
  Administered 2020-08-19: 20 mg via INTRAVENOUS
  Filled 2020-08-19: qty 5

## 2020-08-19 MED ORDER — MAGNESIUM SULFATE IN D5W 1-5 GM/100ML-% IV SOLN
1.0000 g | INTRAVENOUS | Status: DC | PRN
Start: 2020-08-19 — End: 2020-08-21
  Administered 2020-08-21 (×2): 1 g via INTRAVENOUS
  Filled 2020-08-19: qty 100

## 2020-08-19 MED ORDER — ACETAMINOPHEN 325 MG PO TABS
650.0000 mg | ORAL_TABLET | Freq: Four times a day (QID) | ORAL | Status: DC | PRN
Start: 2020-08-19 — End: 2020-08-21
  Administered 2020-08-20: 650 mg via ORAL
  Filled 2020-08-19: qty 2

## 2020-08-19 MED ORDER — MELATONIN 3 MG PO TABS
3.0000 mg | ORAL_TABLET | Freq: Every evening | ORAL | Status: DC | PRN
Start: 2020-08-19 — End: 2020-08-21
  Administered 2020-08-19: 3 mg via ORAL
  Filled 2020-08-19: qty 1

## 2020-08-19 MED ORDER — OXYCODONE HCL 5 MG PO TABS
5.0000 mg | ORAL_TABLET | Freq: Four times a day (QID) | ORAL | Status: DC | PRN
Start: 2020-08-19 — End: 2020-08-21
  Administered 2020-08-19: 5 mg via ORAL
  Filled 2020-08-19 (×2): qty 1

## 2020-08-19 MED ORDER — ASPIRIN 81 MG PO TBEC
81.0000 mg | DELAYED_RELEASE_TABLET | Freq: Every day | ORAL | Status: DC
Start: 2020-08-20 — End: 2020-08-21
  Administered 2020-08-20 – 2020-08-21 (×2): 81 mg via ORAL
  Filled 2020-08-19 (×2): qty 1

## 2020-08-19 MED ORDER — GLUCAGON 1 MG IJ SOLR (WRAP)
1.0000 mg | INTRAMUSCULAR | Status: DC | PRN
Start: 2020-08-19 — End: 2020-08-21

## 2020-08-19 MED ORDER — ACETAMINOPHEN 650 MG RE SUPP
650.0000 mg | Freq: Four times a day (QID) | RECTAL | Status: DC | PRN
Start: 2020-08-19 — End: 2020-08-21

## 2020-08-19 MED ORDER — METOPROLOL TARTRATE 25 MG PO TABS
25.0000 mg | ORAL_TABLET | Freq: Two times a day (BID) | ORAL | Status: DC
Start: 2020-08-19 — End: 2020-08-20
  Administered 2020-08-19 – 2020-08-20 (×2): 25 mg via ORAL
  Filled 2020-08-19 (×2): qty 1

## 2020-08-19 MED ORDER — FLUOXETINE HCL 20 MG PO CAPS
40.0000 mg | ORAL_CAPSULE | Freq: Every day | ORAL | Status: DC
Start: 2020-08-20 — End: 2020-08-21
  Administered 2020-08-20 – 2020-08-21 (×2): 40 mg via ORAL
  Filled 2020-08-19 (×2): qty 2

## 2020-08-19 MED ORDER — DILTIAZEM HCL-SODIUM CHLORIDE 125-0.7 MG/125ML-% IV SOLN
2.5000 mg/h | INTRAVENOUS | Status: DC
Start: 2020-08-19 — End: 2020-08-21
  Administered 2020-08-19: 10 mg/h via INTRAVENOUS
  Administered 2020-08-20: 03:00:00 5 mg/h via INTRAVENOUS
  Filled 2020-08-19 (×3): qty 125

## 2020-08-19 MED ORDER — NALOXONE HCL 0.4 MG/ML IJ SOLN (WRAP)
0.2000 mg | INTRAMUSCULAR | Status: DC | PRN
Start: 2020-08-19 — End: 2020-08-21

## 2020-08-19 MED ORDER — GLIPIZIDE 5 MG PO TABS
5.0000 mg | ORAL_TABLET | Freq: Two times a day (BID) | ORAL | Status: DC
Start: 2020-08-20 — End: 2020-08-20
  Administered 2020-08-20: 10:00:00 5 mg via ORAL
  Filled 2020-08-19 (×3): qty 1

## 2020-08-19 MED ORDER — GABAPENTIN 300 MG PO CAPS
800.0000 mg | ORAL_CAPSULE | Freq: Three times a day (TID) | ORAL | Status: DC
Start: 2020-08-19 — End: 2020-08-21
  Administered 2020-08-19: 600 mg via ORAL
  Administered 2020-08-20 – 2020-08-21 (×4): 800 mg via ORAL
  Filled 2020-08-19: qty 2
  Filled 2020-08-19: qty 1
  Filled 2020-08-19 (×4): qty 2

## 2020-08-19 MED ORDER — ATORVASTATIN CALCIUM 20 MG PO TABS
20.0000 mg | ORAL_TABLET | Freq: Every day | ORAL | Status: DC
Start: 2020-08-20 — End: 2020-08-21
  Administered 2020-08-20 – 2020-08-21 (×2): 20 mg via ORAL
  Filled 2020-08-19 (×2): qty 1

## 2020-08-19 MED ORDER — TAMSULOSIN HCL 0.4 MG PO CAPS
0.4000 mg | ORAL_CAPSULE | Freq: Every day | ORAL | Status: DC
Start: 2020-08-19 — End: 2020-08-21
  Administered 2020-08-19: 0.4 mg via ORAL
  Filled 2020-08-19: qty 1

## 2020-08-19 MED ORDER — METFORMIN HCL 500 MG PO TABS
1000.0000 mg | ORAL_TABLET | Freq: Two times a day (BID) | ORAL | Status: DC
Start: 2020-08-20 — End: 2020-08-20
  Administered 2020-08-20: 10:00:00 1000 mg via ORAL
  Filled 2020-08-19: qty 2

## 2020-08-19 MED ORDER — OXYCODONE-ACETAMINOPHEN 5-325 MG PO TABS
1.0000 | ORAL_TABLET | Freq: Once | ORAL | Status: AC
Start: 2020-08-19 — End: 2020-08-19
  Administered 2020-08-19: 1 via ORAL
  Filled 2020-08-19: qty 1

## 2020-08-19 MED ORDER — POTASSIUM & SODIUM PHOSPHATES 280-160-250 MG PO PACK
2.0000 | PACK | ORAL | Status: DC | PRN
Start: 2020-08-19 — End: 2020-08-21

## 2020-08-19 MED ORDER — POTASSIUM CHLORIDE CRYS ER 20 MEQ PO TBCR
0.0000 meq | EXTENDED_RELEASE_TABLET | ORAL | Status: DC | PRN
Start: 2020-08-19 — End: 2020-08-21

## 2020-08-19 MED ORDER — DEXTROSE 5% IV BOLUS
250.0000 mL | INTRAVENOUS | Status: DC | PRN
Start: 2020-08-19 — End: 2020-08-21

## 2020-08-19 MED ORDER — SODIUM CHLORIDE 0.9 % IV BOLUS
500.0000 mL | Freq: Once | INTRAVENOUS | Status: AC
Start: 2020-08-19 — End: 2020-08-19
  Administered 2020-08-19: 500 mL via INTRAVENOUS

## 2020-08-19 MED ORDER — LISINOPRIL 20 MG PO TABS
20.0000 mg | ORAL_TABLET | Freq: Every day | ORAL | Status: DC
Start: 2020-08-20 — End: 2020-08-20
  Administered 2020-08-20: 10:00:00 20 mg via ORAL
  Filled 2020-08-19: qty 1

## 2020-08-19 MED ORDER — TRAZODONE HCL 50 MG PO TABS
100.0000 mg | ORAL_TABLET | Freq: Every evening | ORAL | Status: DC
Start: 2020-08-19 — End: 2020-08-19

## 2020-08-19 MED ORDER — DEXTROSE 50 % IV SOLN
25.0000 g | INTRAVENOUS | Status: DC | PRN
Start: 2020-08-19 — End: 2020-08-21

## 2020-08-19 MED ORDER — APIXABAN 5 MG PO TABS
5.0000 mg | ORAL_TABLET | Freq: Two times a day (BID) | ORAL | Status: DC
Start: 2020-08-19 — End: 2020-08-21
  Administered 2020-08-19 – 2020-08-21 (×4): 5 mg via ORAL
  Filled 2020-08-19 (×4): qty 1

## 2020-08-19 NOTE — ED Provider Notes (Signed)
[facility] EMERGENCY DEPARTMENT H&P      Visit date: 08/19/2020      CLINICAL SUMMARY          Diagnosis:    .     Final diagnoses:   Atrial fibrillation   Hyperlglycemia -uncontrolled diabetees  Chronic back pain - acute exacerbation  Social issues    MDM Notes:      75 yo male with multiple medical problems - no recent care and not taking medications due to lack of insurance for outpatient care, and homelessness in recent past.  Now found to have a-fib with RVR, also acute exacerbation of chronic back pain.  Hx of DM - smoking 1 ppd.  Check labs, cardiac monitor, CXR, glucose - IV diltiazem for rate control.  Likely needs admission given multiplicity of issues, IV dilitazem at this time.      Disposition:         Observation Admit      ED Disposition       ED Disposition   Observation    Condition   --    Date/Time   Fri Aug 19, 2020  8:20 PM    Comment   Admitting Physician: Janalee Dane [[id number]]   Service:: Medicine [[id number]]   Estimated Length of Stay: < 2 midnights   Tentative Discharge Plan?: Home or Self Care [1]   Does patient need telemetry?: Yes   Telemetry type (separate Telemetry order is also required):: Medical telemetry                            CLINICAL INFORMATION        HPI:      Chief Complaint: Chest Pain and Shoulder Pain  .    Scott Monroe. is a 75 y.o. male w/PMH A-fib not on AC, HTN, HLD, DM, chronic back pain who presents with moderate constant low back pain, worsening over the past few days.  Reports history of chronic low back pain with previous L4-L5 fusion surgery and reportedly had a spinal cord stimulator installed in 2016 without relief.  States he has been unable to take any of his prescription medications including anticoagulation for the past 6 months.  Denies shortness of breath.  Is taking ibuprofen and Tylenol without relief.  Smokes 1 pack of cigarettes daily.    History obtained from: Patient          ROS:      Positive and negative ROS elements as per HPI.  All other  systems reviewed and negative.      Physical Exam:      Pulse 70  BP (!) 158/99  Resp 20  SpO2 97 %  Temp 97.6 F (36.4 C)    Physical Exam  Vitals and nursing note reviewed.   Constitutional:       Comments: Alert, thin, chronically ill-appearing.   HENT:      Head: Normocephalic and atraumatic.   Neck:      Thyroid: No thyromegaly.      Vascular: No JVD.      Trachea: No tracheal deviation.   Cardiovascular:      Rate and Rhythm: Tachycardia present. Rhythm irregularly irregular.      Heart sounds: No murmur heard.    No friction rub. No gallop.      Comments: Tachycardic, irregularly irregular in the 150s.  Pulmonary:      Effort: Pulmonary effort is normal. No  respiratory distress.      Breath sounds: No stridor. No wheezing or rales.   Chest:      Chest wall: No tenderness.   Abdominal:      General: Bowel sounds are normal. There is no distension.      Palpations: Abdomen is soft. There is no mass.      Tenderness: There is no abdominal tenderness. There is no guarding or rebound.      Comments: Abdomen benign.   Musculoskeletal:         General: No tenderness. Normal range of motion.      Cervical back: Normal range of motion and neck supple.      Comments: No edema.   Lymphadenopathy:      Cervical: No cervical adenopathy.   Skin:     General: Skin is warm.      Coloration: Skin is not pale.      Findings: No erythema or rash.   Neurological:      Mental Status: He is alert and oriented to person, place, and time.      Cranial Nerves: No cranial nerve deficit.      Motor: No abnormal muscle tone.      Coordination: Coordination normal.   Psychiatric:         Behavior: Behavior normal.         Thought Content: Thought content normal.         Judgment: Judgment normal.                    PAST HISTORY        Primary Care Provider: Pcp, None, MD        PMH/PSH:    .     Past Medical History:   Diagnosis Date    Hyperlipidemia     Hypertension     Irregular heart beat     Restless leg        He has a past  surgical history that includes Back surgery and Spinal cord stimulator implant.      Social/Family History:      He reports that he has been smoking cigarettes. He has been smoking an average of 1 pack per day. He has never used smokeless tobacco. He reports current alcohol use. No history on file for drug use.    History reviewed. No pertinent family history.      Listed Medications on Arrival:    .     Home Medications               amLODIPine-atorvastatin (CADUET) 5-10 MG per tablet     Take 1 tablet by mouth daily     aspirin EC 81 MG EC tablet     Take 81 mg by mouth daily     atorvastatin (LIPITOR) 20 MG tablet     Take 20 mg by mouth daily     FLUoxetine (PROzac) 40 MG capsule     Take 40 mg by mouth daily     gabapentin (NEURONTIN) 800 MG tablet     Take 800 mg by mouth 3 (three) times daily     glipiZIDE (GLUCOTROL) 5 MG tablet     Take 5 mg by mouth 2 (two) times daily before meals     lisinopril (ZESTRIL) 20 MG tablet     Take 20 mg by mouth daily     metFORMIN (GLUCOPHAGE) 1000 MG tablet     Take 1,000 mg  by mouth 2 (two) times daily with meals     methocarbamol (ROBAXIN) 500 MG tablet     Take 500 mg by mouth 4 (four) times daily     metoprolol tartrate (LOPRESSOR) 25 MG tablet     Take 25 mg by mouth 2 (two) times daily     omeprazole (PriLOSEC) 40 MG capsule     Take 40 mg by mouth daily     raNITIdine HCl (RANITIDINE 150 MAX STRENGTH PO)     Take by mouth     tamsulosin (FLOMAX) 0.4 MG Cap     Take 0.4 mg by mouth Daily after dinner     traZODone (DESYREL) 100 MG tablet     Take 100 mg by mouth nightly           Allergies: He is allergic to strawberry c [ascorbate].            VISIT INFORMATION        Clinical Course in the ED:       NOTE - heart rate has improved to 110 range with IV diltiazem at 10 mg/ hr.  Elevated glucose.  No evidence of CHF.  Plan for admission for further management. Patient agreeable to admission.      Medications Given in the ED:    .     ED Medication Orders (From  admission, onward)      Start Ordered     Status Ordering Provider    08/19/20 1627 08/19/20 1626  dilTIAZem (CARDIZEM) 125 mg in sodium chloride 0.7% 125 mL premix  Continuous        Route: Intravenous  Ordered Dose: 2.5-15 mg/hr     Last MAR action: Handoff/Dual RN Verify Kadedra Vanaken    08/19/20 1502 08/19/20 1501  oxyCODONE-acetaminophen (PERCOCET) 5-325 MG per tablet 1 tablet  Once        Route: Oral  Ordered Dose: 1 tablet     Last MAR action: Given Gelila Well    08/19/20 1502 08/19/20 1501  dilTIAZem (CARDIZEM) injection 20 mg  Once        Route: Intravenous  Ordered Dose: 20 mg     Last MAR action: Given Ayelen Sciortino    08/19/20 1502 08/19/20 1501  sodium chloride 0.9 % bolus 500 mL  Once        Route: Intravenous  Ordered Dose: 500 mL     Last MAR action: Stopped Uriel Horkey              Procedures:      Procedures      Interpretations:      O2 sat-           saturation: 97 %; Oxygen use: room air; Interpretation: Normal    EKG -             interpreted by me: atrial fibrillation with RVR at 147.  Rate related strain. RBBB.                 RESULTS        Lab Results:      Results       Procedure Component Value Units Date/Time    Urinalysis Reflex to Microscopic Exam- Reflex to Culture [[id number]]  (Abnormal) Collected: 08/19/20 1505    Specimen: Urine, Clean Catch Updated: 08/19/20 1545     Urine Type Urine, Clean Ca     Color, UA Straw     Clarity, UA Clear  Specific Gravity UA 1.019     Urine pH 5.5     Leukocyte Esterase, UA Negative     Nitrite, UA Negative     Protein, UR 20= Trace     Glucose, UA >1000= 4+     Ketones UA Negative     Urobilinogen, UA Normal mg/dL      Bilirubin, UA Negative     Blood, UA Small     RBC, UA 6-10 /hpf      WBC, UA 0-5 /hpf      Squamous Epithelial Cells, Urine 0-5 /hpf      Granular Casts, UA 0 - 2 /lpf      Hyaline Casts, UA 0-2 /lpf      Urine Mucus Present    TSH [[id number]] Collected: 08/19/20 1355    Specimen: Blood Updated: 08/19/20 1505      TSH 0.54 uIU/mL     Magnesium [[id number]] Collected: 08/19/20 1355    Specimen: Blood Updated: 08/19/20 1449     Magnesium 1.6 mg/dL     GFR [[id number]] Collected: 08/19/20 1355     Updated: 08/19/20 1449     EGFR 59.4       Comprehensive metabolic panel [[id number]]  (Abnormal) Collected: 08/19/20 1355    Specimen: Blood Updated: 08/19/20 1449     Glucose 301 mg/dL      BUN 62.9 mg/dL      Creatinine 1.2 mg/dL      Sodium 528 mEq/L      Potassium 4.5 mEq/L      Chloride 102 mEq/L      CO2 21 mEq/L      Calcium 9.1 mg/dL      Protein, Total 6.6 g/dL      Albumin 3.6 g/dL      AST (SGOT) 17 U/L      ALT 14 U/L      Alkaline Phosphatase 129 U/L      Bilirubin, Total 0.5 mg/dL      Globulin 3.0 g/dL      Albumin/Globulin Ratio 1.2     Anion Gap 12.0    B-type Natriuretic Peptide [[id number]] Collected: 08/19/20 1355    Specimen: Blood Updated: 08/19/20 1444     B-Natriuretic Peptide 52 pg/mL     Troponin I [[id number]] Collected: 08/19/20 1355    Specimen: Blood Updated: 08/19/20 1444     Troponin I 0.01 ng/mL     Prothrombin time/INR [[id number]] Collected: 08/19/20 1355    Specimen: Blood Updated: 08/19/20 1432     PT 10.6 sec      PT INR 0.9    CBC and differential [[id number]]  (Abnormal) Collected: 08/19/20 1355    Specimen: Blood Updated: 08/19/20 1421     WBC 10.47 x10 3/uL      Hgb 15.8 g/dL      Hematocrit 29.5 %      Platelets 195 x10 3/uL      RBC 5.64 x10 6/uL      MCV 83.7 fL      MCH 28.0 pg      MCHC 33.5 g/dL      RDW 13 %      MPV 9.6 fL      Neutrophils 48.5 %      Lymphocytes Automated 26.0 %      Monocytes 14.2 %      Eosinophils Automated 9.3 %      Basophils Automated 1.6 %      Immature Granulocytes 0.4 %  Nucleated RBC 0.0 /100 WBC      Neutrophils Absolute 5.08 x10 3/uL      Lymphocytes Absolute Automated 2.72 x10 3/uL      Monocytes Absolute Automated 1.49 x10 3/uL      Eosinophils Absolute Automated 0.97 x10 3/uL      Basophils Absolute Automated 0.17 x10 3/uL      Immature Granulocytes  Absolute 0.04 x10 3/uL      Absolute NRBC 0.00 x10 3/uL                 Radiology Results:      XR Chest  AP Portable   Final Result         1. No acute disease.      Kennyth Lose, MD    08/19/2020 3:35 PM                  Scribe Attestation:      I was acting as a Neurosurgeon for Maurine Minister, MD on [facility]  Treatment Team: Scribe: Georgeanna Lea     I am the first provider for this patient and I personally performed the services documented. Treatment Team: Scribe: Georgeanna Lea is scribing for me on [facility].Marland Kitchen This note and the patient instructions accurately reflect work and decisions made by me.  Maurine Minister, MD            Maurine Minister, MD  08/22/20 [phone]

## 2020-08-19 NOTE — ED Triage Notes (Signed)
Pt states he has not taken any of his meds in at least 6 months bc he cannot afford them. Dx afib in 2020

## 2020-08-19 NOTE — ED Notes (Signed)
[facility] EMERGENCY DEPT  ED NURSING NOTE FOR THE RECEIVING INPATIENT NURSE   ED NURSE Aldine Contes, RN   [facility] [id number]   ED CHARGE RN Ladene Artist   ADMISSION INFORMATION   Palmer Fahrner. is a 75 y.o. male admitted with an ED diagnosis of:    1. Atrial fibrillation         Isolation: None   Allergies: Strawberry c [ascorbate]   Holding Orders confirmed? No   Belongings Documented? No   Home medications sent to pharmacy confirmed? N/A   NURSING CARE   Patient Comes From:   Mental Status: Home Independent  alert and oriented   ADL: Independent with all ADLs   Ambulation: no difficulty   Pertinent Information  and Safety Concerns: HOH, Dilt gtt at 15mg /hr     CT / NIH   CT Head ordered on this patient?  No   NIH/Dysphagia assessment done prior to admission? N/A   VITAL SIGNS (at the time of this note)      Vitals:    08/19/20 2000   BP: (!) 137/92   Pulse: 87   Resp: (!) 23   Temp: 98.2 F (36.8 C)   SpO2: 98%

## 2020-08-19 NOTE — ED Notes (Signed)
I completed a brief evaluation of this patient and placed initial orders in triage to expedite patient care.  I am not the primary physician taking care of this patient.       Lelan Pons, MD  08/19/20 1352

## 2020-08-19 NOTE — H&P (Signed)
ADMISSION HISTORY AND PHYSICAL EXAM    Date Time: 08/19/20 8:51 PM  Patient Name: Scott Monroe, Scott Monroe.  Attending Physician: Janalee Dane, MD  Primary Care Physician: Pcp, None, MD    CC: afib w/rvr      Assessment:   75 year old male with past medical history of A. Fib (noncompliant), diabetes, hypertension, hyperlipidemia and multiple other conditions who presented with back pain and was found to be in A. fib with RVR.  Found to have very mild leukocytosis, normal TSH, negative troponin, normal BNP, chest x-ray negative for acute pathology, EKG evident for A. fib with RVR and RBBB with no acute ST changes, and urinalysis negative for infection.  Notably patient has not been taking his medications.      #A. fib with RVR  #Back pain  #Type 2 diabetes  #Hypertension  #Hyperlipidemia      Plan:   Admit to observation    #A. fib with RVR  -EKG evident for RVR and RBBB (no previous EKGs to compare), no acute ST changes  -Mild leukocytosis (10.47)  -Chest x-ray negative for acute pathology, UA negative for infection  -Normal TSH  -Negative troponin, normal BNP  -IV diltiazem in ED-rate controlled  -will continue IV diltiazem for now  -Lost to follow-up at [facility]; will consult cardiology (ordered)  -Telemetry  -Echo ordered  -CHA2DS2-VASc score 3; noncompliant with anticoagulation, ran out of medication, and has not been back to the [place] for refills  -We will restart Eliquis 5 mg twice daily    #Type 2 diabetes  -Hemoglobin A1c ordered  -Restarted glipizide and metformin -Blood glucose goal 140-180    #Hypertension  -Restarted amlodipine and lisinopril    #Chronic back pain  -Pain management with as needed meds  -Restarted gabapentin    CODE STATUS: Full code  DVT PPX: Eliquis  DIET: Regular  FOLEY: Not present    Disposition: (Please see PAF column for Expected D/C Date)   Today's date: 08/19/2020  Admit Date: 08/19/2020  2:17 PM  Service status: Observation  Anticipated discharge needs: tbd    History of Presenting  Illness:   Scott Lamba. is a 75 y.o. male with past medical history of A. Fib (noncompliant), diabetes, hypertension, hyperlipidemia and multiple other conditions who presented with back pain and was found to be in A. fib with RVR.  Patient has been off of his medications for at least 6 months due to homelessness, limited financial resources, and access to a doctor.  He has history of chronic back pain for which she has had multiple back surgeries along with a spinal cord stimulator but has not been operational for 2 years.  He states that his high heart rate does not "bother him" and he usually does not know when he is in RVR.  Denies chest pain, dyspnea, palpitations, headache, vision changes, dizziness, nausea or vomiting.  He complains of back pain, which is chronic, however in the last 24 hours, has been excruciating is why he presented to the ED.    Upon presentation, noted to be hemodynamically stable with heart rate of 156.  Laboratory data evident for mild leukocytosis (10.47), mild hyponatremia (135), normal anion gap, normal LFTs, normal creatinine, normal TSH, negative troponin, normal BNP, urinalysis negative for infection, chest x-ray negative for acute pathology, EKG evident for RBBB and A. fib with RVR without acute ST changes.    In the ED, patient was started on IV diltiazem which managed to control heart rate.  Patient was also administered one-time 5/325 mg Percocet and 500 cc NS bolus.      Past Medical History:     Past Medical History:   Diagnosis Date    Hyperlipidemia     Hypertension     Irregular heart beat     Restless leg          Past Surgical History:     Past Surgical History:   Procedure Laterality Date    BACK SURGERY      SPINAL CORD STIMULATOR IMPLANT         Family History:   History reviewed. No pertinent family history.    Social History:     Social History     Tobacco Use   Smoking Status Every Day    Packs/day: 1.00    Types: Cigarettes   Smokeless Tobacco Never      Social History     Substance and Sexual Activity   Alcohol Use Yes    Comment: beer occasionally     Social History     Substance and Sexual Activity   Drug Use Not on file       Allergies:     Allergies   Allergen Reactions    Strawberry C [Ascorbate] Hives       Medications:     Home Medications               amLODIPine-atorvastatin (CADUET) 5-10 MG per tablet     Take 1 tablet by mouth daily     aspirin EC 81 MG EC tablet     Take 81 mg by mouth daily     atorvastatin (LIPITOR) 20 MG tablet     Take 20 mg by mouth daily     Eliquis 5 MG     Take 5 mg by mouth 2 (two) times daily     FLUoxetine (PROzac) 40 MG capsule     Take 40 mg by mouth daily     gabapentin (NEURONTIN) 800 MG tablet     Take 800 mg by mouth 3 (three) times daily     glipiZIDE (GLUCOTROL) 5 MG tablet     Take 5 mg by mouth 2 (two) times daily before meals     lisinopril (ZESTRIL) 20 MG tablet     Take 20 mg by mouth daily     metFORMIN (GLUCOPHAGE) 1000 MG tablet     Take 1,000 mg by mouth 2 (two) times daily with meals     methocarbamol (ROBAXIN) 500 MG tablet     Take 500 mg by mouth 4 (four) times daily     metoprolol tartrate (LOPRESSOR) 25 MG tablet     Take 25 mg by mouth 2 (two) times daily     omeprazole (PriLOSEC) 40 MG capsule     Take 40 mg by mouth daily     raNITIdine HCl (RANITIDINE 150 MAX STRENGTH PO)     Take by mouth     tamsulosin (FLOMAX) 0.4 MG Cap     Take 0.4 mg by mouth Daily after dinner     traZODone (DESYREL) 100 MG tablet     Take 100 mg by mouth nightly              Review of Systems:   All other systems were reviewed and are negative except as per HPI    Physical Exam:   Patient Vitals for the past 24 hrs:   BP Temp Temp src  Pulse Resp SpO2 Height Weight   08/19/20 2000 (!) 137/92 98.2 F (36.8 C) Temporal 87 (!) 23 98 % -- --   08/19/20 1909 -- -- -- (!) 113 17 98 % -- --   08/19/20 1904 -- -- -- (!) 112 (!) 23 98 % -- --   08/19/20 1903 -- -- -- (!) 109 16 97 % -- --   08/19/20 1902 -- -- -- (!) 106 17 98 %  -- --   08/19/20 1900 140/62 -- -- (!) 119 16 97 % -- --   08/19/20 1819 -- -- -- (!) 128 -- -- -- --   08/19/20 1815 133/83 -- -- (!) 119 15 98 % -- --   08/19/20 1632 146/86 -- -- (!) 126 -- -- -- --   08/19/20 1625 -- -- -- (!) 127 14 98 % -- --   08/19/20 1615 (!) 170/95 -- -- (!) 129 18 99 % -- --   08/19/20 1614 -- -- -- (!) 132 -- 98 % -- --   08/19/20 1559 168/84 -- -- (!) 132 -- 97 % -- --   08/19/20 1549 148/89 -- -- (!) 139 -- -- -- --   08/19/20 1438 -- -- -- (!) 156 19 99 % -- --   08/19/20 1428 156/73 -- -- -- -- -- -- --   08/19/20 1344 (!) 158/99 97.6 F (36.4 C) Tympanic (!) 140 20 99 % 1.702 m (5\' 7" ) 68 kg (150 lb)   08/19/20 1322 -- -- -- 70 -- 97 % -- --     Body mass index is 23.49 kg/m.  No intake or output data in the 24 hours ending 08/19/20 2051    General: awake, alert, oriented x 3; no acute distress.  HEENT: perrla, eomi, sclera anicteric  oropharynx clear without lesions, mucous membranes moist  Neck: supple, no lymphadenopathy, no thyromegaly, no JVD, no carotid bruits  Cardiovascular: Tachycardic and irregularly irregular rhythm, no murmurs, rubs or gallops  Lungs: clear to auscultation bilaterally, without wheezing, rhonchi, or rales  Abdomen: soft, non-tender, non-distended; no palpable masses, no hepatosplenomegaly, normoactive bowel sounds, no rebound or guarding  Extremities: no clubbing, cyanosis, or edema  Neuro: cranial nerves grossly intact, strength 5/5 in upper and lower extremities, sensation intact,   Skin: no rashes or lesions noted        Labs:     Results       Procedure Component Value Units Date/Time    Urinalysis Reflex to Microscopic Exam- Reflex to Culture [[id number]]  (Abnormal) Collected: 08/19/20 1505    Specimen: Urine, Clean Catch Updated: 08/19/20 1545     Urine Type Urine, Clean Ca     Color, UA Straw     Clarity, UA Clear     Specific Gravity UA 1.019     Urine pH 5.5     Leukocyte Esterase, UA Negative     Nitrite, UA Negative     Protein, UR 20= Trace      Glucose, UA >1000= 4+     Ketones UA Negative     Urobilinogen, UA Normal mg/dL      Bilirubin, UA Negative     Blood, UA Small     RBC, UA 6-10 /hpf      WBC, UA 0-5 /hpf      Squamous Epithelial Cells, Urine 0-5 /hpf      Granular Casts, UA 0 - 2 /lpf      Hyaline Casts, UA 0-2 /lpf  Urine Mucus Present    TSH [[id number]] Collected: 08/19/20 1355    Specimen: Blood Updated: 08/19/20 1505     TSH 0.54 uIU/mL     Magnesium [[id number]] Collected: 08/19/20 1355    Specimen: Blood Updated: 08/19/20 1449     Magnesium 1.6 mg/dL     GFR [[id number]] Collected: 08/19/20 1355     Updated: 08/19/20 1449     EGFR 59.4       Comprehensive metabolic panel [[id number]]  (Abnormal) Collected: 08/19/20 1355    Specimen: Blood Updated: 08/19/20 1449     Glucose 301 mg/dL      BUN 75.6 mg/dL      Creatinine 1.2 mg/dL      Sodium 433 mEq/L      Potassium 4.5 mEq/L      Chloride 102 mEq/L      CO2 21 mEq/L      Calcium 9.1 mg/dL      Protein, Total 6.6 g/dL      Albumin 3.6 g/dL      AST (SGOT) 17 U/L      ALT 14 U/L      Alkaline Phosphatase 129 U/L      Bilirubin, Total 0.5 mg/dL      Globulin 3.0 g/dL      Albumin/Globulin Ratio 1.2     Anion Gap 12.0    B-type Natriuretic Peptide [[id number]] Collected: 08/19/20 1355    Specimen: Blood Updated: 08/19/20 1444     B-Natriuretic Peptide 52 pg/mL     Troponin I [[id number]] Collected: 08/19/20 1355    Specimen: Blood Updated: 08/19/20 1444     Troponin I 0.01 ng/mL     Prothrombin time/INR [[id number]] Collected: 08/19/20 1355    Specimen: Blood Updated: 08/19/20 1432     PT 10.6 sec      PT INR 0.9    CBC and differential [[id number]]  (Abnormal) Collected: 08/19/20 1355    Specimen: Blood Updated: 08/19/20 1421     WBC 10.47 x10 3/uL      Hgb 15.8 g/dL      Hematocrit 62.3 %      Platelets 195 x10 3/uL      RBC 5.64 x10 6/uL      MCV 83.7 fL      MCH 28.0 pg      MCHC 33.5 g/dL      RDW 13 %      MPV 9.6 fL      Neutrophils 48.5 %      Lymphocytes Automated 26.0 %      Monocytes  14.2 %      Eosinophils Automated 9.3 %      Basophils Automated 1.6 %      Immature Granulocytes 0.4 %      Nucleated RBC 0.0 /100 WBC      Neutrophils Absolute 5.08 x10 3/uL      Lymphocytes Absolute Automated 2.72 x10 3/uL      Monocytes Absolute Automated 1.49 x10 3/uL      Eosinophils Absolute Automated 0.97 x10 3/uL      Basophils Absolute Automated 0.17 x10 3/uL      Immature Granulocytes Absolute 0.04 x10 3/uL      Absolute NRBC 0.00 x10 3/uL             Imaging reviewed:  XR Chest  AP Portable    Result Date: 08/19/2020  1. No acute disease. Kennyth Lose, MD  08/19/2020 3:35 PM  Time spent on direct patient care: 45 minutes; of which greater than 50% of this time was spent in reviewing medical chart, counseling(See A/P) and coordination of care.    Contact Information:  Monday to Friday 7am-6pm:   Primary Contact- Medicine NP/PA- Specific hours/contact information per Sticky Note   Secondary- Medicine Attending  [facility] Hospitalist at pager [phone]  Monday to Friday after 6pm-   Primary Contact- On-Call Hospitalist at pager [phone]  Weekends-   Primary Contact- Medicine Attending  Secondary- On-Call Hospitalist at pager [phone]    Signed by: Ricarda Frame, MD, MD   cc:Pcp, None, MD

## 2020-08-20 ENCOUNTER — Observation Stay (HOSPITAL_BASED_OUTPATIENT_CLINIC_OR_DEPARTMENT_OTHER): Payer: Self-pay

## 2020-08-20 DIAGNOSIS — I4891 Unspecified atrial fibrillation: Secondary | ICD-10-CM

## 2020-08-20 DIAGNOSIS — I480 Paroxysmal atrial fibrillation: Secondary | ICD-10-CM

## 2020-08-20 LAB — ECG 12-LEAD
Atrial Rate: 147 {beats}/min
Q-T Interval: 274 ms
QRS Duration: 122 ms
QTC Calculation (Bezet): 428 ms
R Axis: 107 degrees
T Axis: -6 degrees
Ventricular Rate: 147 {beats}/min

## 2020-08-20 LAB — CBC AND DIFFERENTIAL
Absolute NRBC: 0 10*3/uL (ref 0.00–0.00)
Basophils Absolute Automated: 0.14 10*3/uL — ABNORMAL HIGH (ref 0.00–0.08)
Basophils Automated: 1.7 %
Eosinophils Absolute Automated: 0.85 10*3/uL — ABNORMAL HIGH (ref 0.00–0.44)
Eosinophils Automated: 10.3 %
Hematocrit: 43.1 % (ref 37.6–49.6)
Hgb: 14.7 g/dL (ref 12.5–17.1)
Immature Granulocytes Absolute: 0.04 10*3/uL (ref 0.00–0.07)
Immature Granulocytes: 0.5 %
Lymphocytes Absolute Automated: 3.41 10*3/uL — ABNORMAL HIGH (ref 0.42–3.22)
Lymphocytes Automated: 41.3 %
MCH: 28.7 pg (ref 25.1–33.5)
MCHC: 34.1 g/dL (ref 31.5–35.8)
MCV: 84 fL (ref 78.0–96.0)
MPV: 9.7 fL (ref 8.9–12.5)
Monocytes Absolute Automated: 0.83 10*3/uL (ref 0.21–0.85)
Monocytes: 10 %
Neutrophils Absolute: 2.99 10*3/uL (ref 1.10–6.33)
Neutrophils: 36.2 %
Nucleated RBC: 0 /100 WBC (ref 0.0–0.0)
Platelets: 174 10*3/uL (ref 142–346)
RBC: 5.13 10*6/uL (ref 4.20–5.90)
RDW: 13 % (ref 11–15)
WBC: 8.26 10*3/uL (ref 3.10–9.50)

## 2020-08-20 LAB — ECHOCARDIOGRAM ADULT COMPLETE W CLR/ DOPP WAVEFORM
AV Area (Cont Eq VTI): 2.25
AV Area (Cont Eq VTI): 2.265
AV Mean Gradient: 3
AV Mean Gradient: 4
AV Mean Gradient: 4
AV Mean Gradient: 4
AV Peak Velocity: 114
AV Peak Velocity: 134
AV Peak Velocity: 134
AV Peak Velocity: 137
Ao Root Diameter (2D): 3.6
BP Mod LV Ejection Fraction: 63.4
IVS Diastolic Thickness (2D): 1.5
LA Dimension (2D): 3.6
LA Volume Index (BP A-L): 0.022
LVID diastole (2D): 4.1
LVID systole (2D): 2.9
MV E/e' (Average): 12.618
Mitral Valve Findings: NORMAL
Prox Ascending Aorta Diameter: 3.3
Pulmonary Valve Findings: NORMAL
RV Basal Diastolic Dimension: 3.2
RV Basal Diastolic Dimension: 3.8
RV Function: DECREASED
RV Systolic Pressure: 16
RV Systolic Pressure: 18.21
Site RA Size (AS): NORMAL
Site RV Size (AS): NORMAL
TAPSE: 1.43
Tricuspid Valve Findings: NORMAL

## 2020-08-20 LAB — COMPREHENSIVE METABOLIC PANEL
ALT: 11 U/L (ref 0–55)
AST (SGOT): 14 U/L (ref 5–34)
Albumin/Globulin Ratio: 1 (ref 0.9–2.2)
Albumin: 2.8 g/dL — ABNORMAL LOW (ref 3.5–5.0)
Alkaline Phosphatase: 100 U/L (ref 37–117)
Anion Gap: 9 (ref 5.0–15.0)
BUN: 18 mg/dL (ref 9.0–28.0)
Bilirubin, Total: 0.4 mg/dL (ref 0.2–1.2)
CO2: 22 mEq/L (ref 22–29)
Calcium: 8.7 mg/dL (ref 7.9–10.2)
Chloride: 103 mEq/L (ref 100–111)
Creatinine: 1.3 mg/dL (ref 0.7–1.3)
Globulin: 2.9 g/dL (ref 2.0–3.6)
Glucose: 272 mg/dL — ABNORMAL HIGH (ref 70–100)
Potassium: 4.2 mEq/L (ref 3.5–5.1)
Protein, Total: 5.7 g/dL — ABNORMAL LOW (ref 6.0–8.3)
Sodium: 134 mEq/L — ABNORMAL LOW (ref 136–145)

## 2020-08-20 LAB — HEMOGLOBIN A1C
Average Estimated Glucose: 289.1 mg/dL
Hemoglobin A1C: 11.7 % — ABNORMAL HIGH (ref 4.6–5.9)

## 2020-08-20 LAB — GFR: EGFR: 54.2

## 2020-08-20 LAB — GLUCOSE WHOLE BLOOD - POCT
Whole Blood Glucose POCT: 242 mg/dL — ABNORMAL HIGH (ref 70–100)
Whole Blood Glucose POCT: 354 mg/dL — ABNORMAL HIGH (ref 70–100)
Whole Blood Glucose POCT: 388 mg/dL — ABNORMAL HIGH (ref 70–100)
Whole Blood Glucose POCT: 114 mg/dL — ABNORMAL HIGH (ref 70–100)
Whole Blood Glucose POCT: 121 mg/dL — ABNORMAL HIGH (ref 70–100)

## 2020-08-20 LAB — MAGNESIUM: Magnesium: 1.7 mg/dL (ref 1.6–2.6)

## 2020-08-20 MED ORDER — INSULIN LISPRO 100 UNIT/ML SOLN (WRAP)
1.0000 [IU] | Freq: Three times a day (TID) | Status: DC
Start: 2020-08-20 — End: 2020-08-21
  Administered 2020-08-20: 10:00:00 2 [IU] via SUBCUTANEOUS
  Administered 2020-08-20: 12:00:00 5 [IU] via SUBCUTANEOUS
  Administered 2020-08-21: 13:00:00 3 [IU] via SUBCUTANEOUS
  Administered 2020-08-21: 09:00:00 1 [IU] via SUBCUTANEOUS
  Filled 2020-08-20 (×2): qty 6
  Filled 2020-08-20: qty 3
  Filled 2020-08-20: qty 6
  Filled 2020-08-20: qty 3

## 2020-08-20 MED ORDER — INSULIN GLARGINE 100 UNIT/ML SC SOLN
10.0000 [IU] | Freq: Every morning | SUBCUTANEOUS | Status: DC
Start: 2020-08-20 — End: 2020-08-21
  Administered 2020-08-20 – 2020-08-21 (×2): 10 [IU] via SUBCUTANEOUS
  Filled 2020-08-20 (×2): qty 10

## 2020-08-20 MED ORDER — INSULIN LISPRO 100 UNIT/ML SOLN (WRAP)
1.0000 [IU] | Freq: Every evening | Status: DC
Start: 2020-08-20 — End: 2020-08-21

## 2020-08-20 MED ORDER — METOPROLOL TARTRATE 25 MG PO TABS
25.0000 mg | ORAL_TABLET | Freq: Once | ORAL | Status: DC
Start: 2020-08-20 — End: 2020-08-21
  Filled 2020-08-20: qty 1

## 2020-08-20 MED ORDER — INSULIN LISPRO 100 UNIT/ML SOLN (WRAP)
5.0000 [IU] | Freq: Three times a day (TID) | Status: DC
Start: 2020-08-20 — End: 2020-08-21
  Administered 2020-08-20 – 2020-08-21 (×3): 5 [IU] via SUBCUTANEOUS
  Filled 2020-08-20 (×3): qty 15

## 2020-08-20 MED ORDER — METOPROLOL TARTRATE 25 MG PO TABS
50.0000 mg | ORAL_TABLET | Freq: Two times a day (BID) | ORAL | Status: DC
Start: 2020-08-20 — End: 2020-08-21
  Administered 2020-08-21 (×2): 50 mg via ORAL
  Filled 2020-08-20 (×2): qty 2

## 2020-08-20 NOTE — Consults (Addendum)
[place] CARDIOLOGY CONSULTATION    Date Time: 08/20/20 8:11 AM  Patient Name: Scott Monroe, [name]  Requesting Physician: Tiffany Kocher, MD  Admission Date: 08/19/2020   Primary Cardiologist: none    Reason for Consultation:   Afib RVR  History:   Loring Liskey. is a 75 y.o. male with a history of pAfib (non-compliant), DM II, HTN, HLD, medication non-compliance who presented to the hospital on 08/19/2020 with back pain and was found to be in Afib RVR for which we are consulted. Patient stated he has not been taking any medications for the past 6 months as he did not have a doctor and was unable to obtain refills. States he had chest discomfort for the past 4 days but denies any palpitations, SOB, chest pain at rest or with exertion. Denies any lower extremity edema, PND, orthopnea, presyncope or syncope.     Smokes a pack a day, denies EtOH use or substance abuse.     Social situation: was evicted from a room he was renting in a house and now lives with ex-wife, her husband, daughter and grandson.     Past Medical History:     Past Medical History:   Diagnosis Date    Hyperlipidemia     Hypertension     Irregular heart beat     Restless leg       Past Surgical History:   Procedure Laterality Date    BACK SURGERY      SPINAL CORD STIMULATOR IMPLANT        Family History:   History reviewed. No pertinent family history.  Social History:     Social History     Tobacco Use    Smoking status: Every Day     Packs/day: 1.00     Types: Cigarettes    Smokeless tobacco: Never   Substance Use Topics    Alcohol use: Yes     Comment: beer occasionally     Allergies:   He is allergic to strawberry c [ascorbate].  Medications:   Home Meds:   Current Facility-Administered Medications:     acetaminophen (TYLENOL) tablet 650 mg, 650 mg, Oral, Q6H PRN, 650 mg at 08/20/20 0026 **OR** acetaminophen (TYLENOL) suppository 650 mg, 650 mg, Rectal, Q6H PRN, Ricarda Frame, MD    apixaban Everlene Balls) tablet 5 mg, 5 mg, Oral, Q12H SCH,  Ricarda Frame, MD, 5 mg at 08/19/20 2234    aspirin EC tablet 81 mg, 81 mg, Oral, Daily, Ricarda Frame, MD    atorvastatin (LIPITOR) tablet 20 mg, 20 mg, Oral, Daily, Ricarda Frame, MD    Nursing communication: Adult Hypoglycemia Treatment Algorithm, , , Until Discontinued **AND** glucagon (rDNA) (GLUCAGEN) injection 1 mg, 1 mg, Intramuscular, PRN **AND** dextrose 5 % bolus 250 mL, 250 mL, Intravenous, PRN **AND** dextrose 50 % bolus 25 g, 25 g, Intravenous, PRN **AND** dextrose (D10W) 10% bolus 250 mL, 25 g, Intravenous, PRN, Ricarda Frame, MD    dilTIAZem (CARDIZEM) 125 mg in sodium chloride 0.7% 125 mL premix, 2.5-15 mg/hr, Intravenous, Continuous, Mechanick, Judith, MD, Last Rate: 5 mL/hr at 08/20/20 0244, 5 mg/hr at 08/20/20 0244    FLUoxetine (PROzac) capsule 40 mg, 40 mg, Oral, Daily, Ricarda Frame, MD    gabapentin (NEURONTIN) capsule 800 mg, 800 mg, Oral, TID, Ricarda Frame, MD, 800 mg at 08/20/20 0550    glipiZIDE (GLUCOTROL) tablet 5 mg, 5 mg, Oral, BID AC, Ricarda Frame, MD  HYDROmorphone (DILAUDID) injection 0.2 mg, 0.2 mg, Intravenous, Q2H PRN, Ricarda Frame, MD    insulin lispro injection 1-3 Units, 1-3 Units, Subcutaneous, QHS **AND** NSG Communication: Glucose POCT order, , , At bedtime, Manson Passey, Charity L, FNP    insulin lispro injection 1-5 Units, 1-5 Units, Subcutaneous, TID AC **AND** NSG Communication: Glucose POCT order, , , AC, Brown, Charity L, FNP    lisinopril (ZESTRIL) tablet 20 mg, 20 mg, Oral, Daily, Ricarda Frame, MD    magnesium sulfate 1g in dextrose 5% IVPB (premix), 1 g, Intravenous, PRN, Ricarda Frame, MD    melatonin tablet 3 mg, 3 mg, Oral, QHS PRN, Ricarda Frame, MD, 3 mg at 08/19/20 2233    metFORMIN (GLUCOPHAGE) tablet 1,000 mg, 1,000 mg, Oral, BID Meals, Ricarda Frame, MD    methocarbamol (ROBAXIN) tablet 500 mg, 500 mg, Oral, QID, Ricarda Frame, MD, 500 mg at 08/19/20 2233    metoprolol tartrate (LOPRESSOR) tablet 25 mg, 25 mg, Oral, Q12H SCH, Ricarda Frame, MD, 25 mg at 08/19/20 2233    naloxone [facility]) injection 0.2 mg, 0.2 mg, Intravenous, PRN, Ricarda Frame, MD    oxyCODONE (ROXICODONE) immediate release tablet 5 mg, 5 mg, Oral, Q6H PRN, Ricarda Frame, MD, 5 mg at 08/19/20 2234    potassium & sodium phosphates (PHOS-NAK) 280-160-250 MG packet 2 packet, 2 packet, Oral, PRN, Ricarda Frame, MD    potassium chloride (KLOR-CON) CR tablet 0-40 mEq, 0-40 mEq, Oral, PRN **AND** potassium chloride 10 mEq in 100 mL IVPB (premix), 10 mEq, Intravenous, PRN, Ricarda Frame, MD    tamsulosin [facility]) capsule 0.4 mg, 0.4 mg, Oral, Daily after dinner, Ricarda Frame, MD, 0.4 mg at 08/19/20 2233   Medications Prior to Admission   Medication Sig    amLODIPine-atorvastatin (CADUET) 5-10 MG per tablet Take 1 tablet by mouth daily    aspirin EC 81 MG EC tablet Take 81 mg by mouth daily    atorvastatin (LIPITOR) 20 MG tablet Take 20 mg by mouth daily    Eliquis 5 MG Take 5 mg by mouth 2 (two) times daily    FLUoxetine (PROzac) 40 MG capsule Take 40 mg by mouth daily    gabapentin (NEURONTIN) 800 MG tablet Take 800 mg by mouth 3 (three) times daily    glipiZIDE (GLUCOTROL) 5 MG tablet Take 5 mg by mouth 2 (two) times daily before meals    lisinopril (ZESTRIL) 20 MG tablet Take 20 mg by mouth daily    metFORMIN (GLUCOPHAGE) 1000 MG tablet Take 1,000 mg by mouth 2 (two) times daily with meals    methocarbamol (ROBAXIN) 500 MG tablet Take 500 mg by mouth 4 (four) times daily    metoprolol tartrate (LOPRESSOR) 25 MG tablet Take 25 mg by mouth 2 (two) times daily    omeprazole (PriLOSEC) 40 MG capsule Take 40 mg by mouth daily    raNITIdine HCl (RANITIDINE 150 MAX STRENGTH PO) Take by mouth    tamsulosin (FLOMAX) 0.4 MG Cap Take 0.4 mg by mouth Daily after dinner    traZODone (DESYREL) 100 MG tablet Take 100 mg by mouth nightly      Scheduled Meds:     apixaban, 5 mg, Oral, Q12H [facility]  aspirin EC, 81 mg, Oral, Daily  atorvastatin, 20 mg, Oral, Daily  FLUoxetine, 40 mg, Oral,  Daily  gabapentin, 800 mg, Oral, TID  glipiZIDE, 5 mg, Oral, BID AC  insulin lispro, 1-3 Units,  Subcutaneous, QHS  insulin lispro, 1-5 Units, Subcutaneous, TID AC  lisinopril, 20 mg, Oral, Daily  metFORMIN, 1,000 mg, Oral, BID Meals  methocarbamol, 500 mg, Oral, QID  metoprolol tartrate, 25 mg, Oral, Q12H SCH  tamsulosin, 0.4 mg, Oral, Daily after dinner      Continuous Infusions:   dilTIAZem 5 mg/hr (08/20/20 0244)       PRN Meds:  acetaminophen **OR** acetaminophen, Nursing communication: Adult Hypoglycemia Treatment Algorithm **AND** glucagon (rDNA) **AND** dextrose **AND** dextrose **AND** dextrose, HYDROmorphone, magnesium sulfate, melatonin, naloxone, oxyCODONE, potassium & sodium phosphates, potassium chloride **AND** potassium chloride    Review of Systems:   All other systems were reviewed and negative except as mentioned above.    Vitals Reviewed:   Vital Signs: BP 112/67   Pulse 76   Temp 97.7 F (36.5 C) (Oral)   Resp 18   Ht 1.702 m (5\' 7" )   Wt 67.8 kg (149 lb 8 oz)   SpO2 95%   BMI 23.42 kg/m    No intake or output data in the 24 hours ending 08/20/20 0700    Cardiographics:   ECG: Afib w/ HR 147  Telemetry: Afib w/ HR 50-80s  Echocardiogram: pending    Labs Reviewed:     Recent Labs     08/20/20  0509 08/19/20  1355   WBC 8.26 10.47*   Hgb 14.7 15.8   Hematocrit 43.1 47.2   Platelets 174 195     Recent Labs     08/19/20  1355   Troponin I 0.01   B-Natriuretic Peptide 52    Recent Labs     08/20/20  0509 08/19/20  1355   Sodium 134* 135*   Potassium 4.2 4.5   CO2 22 21*   BUN 18.0 13.0   Creatinine 1.3 1.2   Glucose 272* 301*   Magnesium  --  1.6    Estimated Creatinine Clearance: 48 mL/min (based on SCr of 1.3 mg/dL).   Recent Labs     08/19/20  1355   PT INR 0.9   TSH 0.54        Rads:     Radiology Results (24 Hour)       Procedure Component Value Units Date/Time    XR Chest  AP Portable [[id number]] Collected: 08/19/20 1531    Order Status: Completed Updated: 08/19/20 1537    Narrative:       HISTORY: chest pain     COMPARISON: Portable chest 10/27/2018    FINDINGS:     No pneumothorax. No infiltrate identified. Pulmonary vascularity is  normal. No pleural effusion. Cardiac silhouette is within normal.  Atherosclerotic calcification tortuosity of the aorta. Spinal stimulator  device overlies the mid thoracic spine.      Impression:          1. No acute disease.    Kennyth Lose, MD   08/19/2020 3:35 PM          Assessment:   Larey Dresser Meryl Ponder. is a 75 y.o. male with a history of pAfib (non-compliant), DM II, HTN, HLD, medication non-compliance who presented to the hospital on 08/19/2020 with back pain and was found to be in Afib RVR for which we are consulted.     - pAfib RVR - now rate controlled   - DM II  - ?DM neuropathy - states legs are weak  - HTN  - HLD  - medication non-compliance  - Social issue: evicted last Wednesday  Plan:     - currently on diltiazem gtt; I would either transition to dilt 30mg  q6h with eventual transition to long acting or increase his metoprolol dose.  - echocardiogram pending   - may need to hold lisinopril to make room for rate controlling agents  - suggest Eliquis 5mg  BID, will need to determine a plan for him to receive this given that he has no prescription insurance  - stop ASA if he is on anticoagulation   - continue home atorvastatin 20mg  q nightly  - DM II management per primary team    Signed by: Genevive Bi, PA    Patient seen and examined. History, vitals, and pertinent test results reviewed. I performed a physical exam as below:    Physical Exam:   General Appearance:  Alert, well-appearing male in no acute distress.    HEENT: Sclera anicteric, conjunctiva without pallor, moist mucous membranes, normal dentition. No arcus.   Neck: Supple without jugular venous distention. Thyroid nonpalpable. Normal carotid upstroke without bruits.   Chest: left sided rhonchi, otherwise clear  Cardiovascular: irregularly irregular, no m/r/g   Abdomen: Soft,  nontender, nondistended, with normoactive bowel sounds. No organomegaly.  No pulsatile masses, or bruits.   Extremities: Warm without edema, clubbing, or cyanosis. All peripheral pulses are full and equal.   Skin: Normal coloration and turgor, no rash, xanthoma or xanthelasma.   Musculoskeletal: no joint tenderness, deformity or swelling  Neuro: Alert and oriented x3. Grossly intact. Strength is symmetrical. Normal mood and affect.     I agree with the assessment and plan as detailed above.        Signed by: Garth Schlatter, MD, [facility]      [facility] Cardiology   [facility] NP Spectralink [phone] or [phone] (M-F 8 am-5 pm)   Tyson Babinski [facility] Spectralink [phone]  (M-F 8 am-5 pm)  After Hours On-Call Physician: [phone]    cc:Pcp, None, MD

## 2020-08-20 NOTE — UM Notes (Signed)
Observation Review 32/15:    75 year old male admitted to [facility] as observation on 8/5 for afib with RVR.  Patient presents with back pain was found to be in A. fib with RVR.  Found to have very mild leukocytosis, normal TSH, negative troponin, normal BNP, chest x-ray negative for acute pathology, EKG evident for A. fib with RVR and RBBB with no acute ST changes, and urinalysis negative for infection.   IV diltiazem and cards consult.  Echo pending.    Vitals:  Temp 98.2  HR 156  RR 23  94% on RA    Labs:  WBC 10.47    UA: Protein 20, Glucose > 1000, Small Blood    Imaging: Echo Pending    XR Chest  AP Portable [[id number]] (Order [id number])  1. No acute disease.     Kennyth Lose, MD   08/19/2020 3:35 PM    Ekg:  ATRIAL FIBRILLATION  WITH RAPID VENTRICULAR RESPONSE  RIGHT BUNDLE BRANCH BLOCK  Cannot rule out  INFERIOR MYOCARDIAL INFARCTION  , AGE UNDETERMINED  ABNORMAL ECG  NO PREVIOUS ECGS AVAILABLE    Pmh: A. Fib (noncompliant), diabetes, hypertension, hyperlipidemia     Plan:      #A. fib with RVR  -EKG evident for RVR and RBBB (no previous EKGs to compare), no acute ST changes  -Mild leukocytosis (10.47)  -Chest x-ray negative for acute pathology, UA negative for infection  -Normal TSH  -Negative troponin, normal BNP  -IV diltiazem in ED-rate controlled  -will continue IV diltiazem for now  -Lost to follow-up at [facility]; will consult cardiology (ordered)  -Telemetry  -Echo ordered  -CHA2DS2-VASc score 3; noncompliant with anticoagulation, ran out of medication, and has not been back to the [place] for refills  -We will restart Eliquis 5 mg twice daily     #Type 2 diabetes  -Hemoglobin A1c ordered  -Restarted glipizide and metformin -Blood glucose goal 140-180     #Hypertension  -Restarted amlodipine and lisinopril     #Chronic back pain  -Pain management with as needed meds  -Restarted gabapentin    Current Facility-Administered Medications   Medication Dose Route Frequency Last Rate Last Admin    acetaminophen (TYLENOL) tablet  650 mg  650 mg Oral Q6H PRN   650 mg at 08/20/20 0026    Or    acetaminophen (TYLENOL) suppository 650 mg  650 mg Rectal Q6H PRN        apixaban (ELIQUIS) tablet 5 mg  5 mg Oral Q12H SCH   5 mg at 08/19/20 2234    aspirin EC tablet 81 mg  81 mg Oral Daily        atorvastatin (LIPITOR) tablet 20 mg  20 mg Oral Daily        glucagon (rDNA) (GLUCAGEN) injection 1 mg  1 mg Intramuscular PRN        And    dextrose 5 % bolus 250 mL  250 mL Intravenous PRN        And    dextrose 50 % bolus 25 g  25 g Intravenous PRN        And    dextrose (D10W) 10% bolus 250 mL  25 g Intravenous PRN        dilTIAZem (CARDIZEM) 125 mg in sodium chloride 0.7% 125 mL premix  2.5-15 mg/hr Intravenous Continuous 5 mL/hr at 08/20/20 0244 5 mg/hr at 08/20/20 0244    FLUoxetine (PROzac) capsule 40 mg  40 mg Oral Daily  gabapentin (NEURONTIN) capsule 800 mg  800 mg Oral TID   800 mg at 08/20/20 0550    glipiZIDE (GLUCOTROL) tablet 5 mg  5 mg Oral BID AC        HYDROmorphone (DILAUDID) injection 0.2 mg  0.2 mg Intravenous Q2H PRN        insulin lispro injection 1-3 Units  1-3 Units Subcutaneous QHS        insulin lispro injection 1-5 Units  1-5 Units Subcutaneous TID AC        lisinopril (ZESTRIL) tablet 20 mg  20 mg Oral Daily        magnesium sulfate 1g in dextrose 5% IVPB (premix)  1 g Intravenous PRN        melatonin tablet 3 mg  3 mg Oral QHS PRN   3 mg at 08/19/20 2233    metFORMIN (GLUCOPHAGE) tablet 1,000 mg  1,000 mg Oral BID Meals        methocarbamol (ROBAXIN) tablet 500 mg  500 mg Oral QID   500 mg at 08/19/20 2233    metoprolol tartrate (LOPRESSOR) tablet 25 mg  25 mg Oral Q12H SCH   25 mg at 08/19/20 2233    naloxone [facility]) injection 0.2 mg  0.2 mg Intravenous PRN        oxyCODONE (ROXICODONE) immediate release tablet 5 mg  5 mg Oral Q6H PRN   5 mg at 08/19/20 2234    potassium & sodium phosphates (PHOS-NAK) 280-160-250 MG packet 2 packet  2 packet Oral PRN        potassium chloride (KLOR-CON) CR tablet 0-40 mEq  0-40  mEq Oral PRN        And    potassium chloride 10 mEq in 100 mL IVPB (premix)  10 mEq Intravenous PRN        tamsulosin (FLOMAX) capsule 0.4 mg  0.4 mg Oral Daily after dinner   0.4 mg at 08/19/20 2233     UTILIZATION REVIEW CONTACT: Name: Diona Browner, RN, CM  Clinical Case Manager  - Utilization Review  [facility]  Address:  [address] [place], [place]  [address]  NPI:   [id number]  Tax ID:  [id number]  Direct: [phone] (Voicemail only)  Main: [phone]  Fax: [phone]  Email: [email or web address]     Please use fax number [phone] to provide authorization for hospital services or to request additional information.

## 2020-08-20 NOTE — Plan of Care (Signed)
Admission Assessment: CTUS     Admitted from: ED @ 2145    Orientation: A&Ox4, HOH   VSS/other: RA  Rhythm on tele: A-Fib, RBBB  Ambulation: SBA  Lines/Drips: 18G LAC - Dilt gtt @ 5  GI/GU: Cont x2, LBM: 8/5  Code Status: Full  Fall Score: MODERATE     Verified patient ID/arm bands. Ensured appropriate safety precautions in place including: assigned fall score interventions, review of known allergies, special needs, personal items within reach, and call bell within reach.      Critical Labs/Images/Procedures:  8/5: CXR- (-) for acute pathology      Comments:  - @ 0030 pts HR began to drop in the 50s, and SBP in the 90s while on dilt. Held dilt and paged hospitalist. Restarted dilt once pts HR consistently ranged from 70-80s. Pts HR has remained in the 60-90s since restarting Dilt @ 5.   *See doc flow for complete assessment and vitals*     Plan:   - Vitals Q4H  - Monitor HR/Rhythm   - Pain management  - Dilt gtt  - On Eliquis  - ECHO ordered        Skin Assessment  Cosign Note: Rosalita Chessman, RN     Skin WNL EXCEPT for:  HEAD:   LUE:   RUE:   TORSO:   BACK: 3 scars from previous back surgeries  SACRUM:  BUTTOCKS/INGUINAL/PERINEAL:  LLE:    RLE: R knee scar  OTHER:  scattered abrasions

## 2020-08-20 NOTE — Progress Notes (Signed)
MEDICINE PROGRESS NOTE    Date Time: 08/20/20 6:45 AM  Patient Name: Scott Monroe, Scott Monroe.  Attending Physician: Tiffany Kocher, MD      Hospital Course:   75 y.o. male w history of HTN, HLD, DMII, atrial fibrillation, RLS, chronic back pain s/p stimulator and multiple surgical interventions whom presented with acute on chronic back pain and found to be in afib RVR in the context of being off of medications at least 6 months due to homelessness and lack of access to healthcare. He was started on diltiazem gtt and resumed on his prior home medications including eliquis  Assessment:   Atrial fibrillation w/ RVR CHA2DS2-VASc at least 3-now rate controlled  Essential hypertension  Hyperlipidemia  Poorly controlled DMII a1c 11.7  Acute on chronic back pain  Proteinuria  Glucosuria  Reactive leukocytosis-resolved  Acidosis-resolved  Hyponatremia-possibly pseudo related to hyperglycemia  Lack of access to medical care  Plan:   Appreciate cardiology input  Follow up echocardiogram  Will increase metoprolol and wean dilt, cont lisinopril  Cont eliquis (will need long term plan for [facility] given lack of insurance)  Cont asa, statin  Cont metformin, glipizide, add SSI; neurontin  Cont prozac, robaxin  Referral placed for [employer]    DVT ppx: eliquis  PT/OT:  Foley: none  Code Status: Full Code    Case discussed with: attending MD, patient, RN, cardiology    Disposition:    Today's date: 08/20/2020  Admit Date: 08/19/2020  2:17 PM  LOS: 0  Anticipated discharge needs: TBD    CC: back pain  Subjective:   Pt notes that he feels well, doesn't feel the abnormal heart beat  Review of Systems:     All other systems were reviewed and are negative except: as above    Physical Exam:     VITAL SIGNS PHYSICAL EXAM   Temp:  [97.3 F (36.3 C)-98.2 F (36.8 C)] 97.3 F (36.3 C)  Heart Rate:  [59-156] 76  Resp Rate:  [14-23] 17  BP: (97-170)/(62-99) 112/67        No intake or output data in the 24 hours ending 08/20/20 0645 Physical  Exam  General: awake, alert X oriented  HEENT: anicteric sclera, oropharynx clear without lesions, dry mucous membranes  Cardiovascular:irregular S1, S2, no murmurs, rubs or gallops  Lungs: clear to auscultation bilaterally, without wheezing, rhonchi, or rales  Abdomen: soft, non-tender, non-distended; no palpable masses, normoactive bowel sounds  Extremities: no edema  Neuro: cranial nerves grossly intact, strength 5/5 in upper and lower extremities, sensation intact      Lines:  Patient Lines/Drains/Airways Status       Active PICC Line / CVC Line / PIV Line / Drain / Airway / Intraosseous Line / Epidural Line / ART Line / Line / Wound / Pressure Ulcer / NG/OG Tube       Name Placement date Placement time Site Days    Peripheral IV 08/19/20 18 G Standard Left Antecubital 08/19/20  --  Antecubital  1                    Meds:     Current Facility-Administered Medications   Medication Dose Route Frequency Provider Last Rate Last Admin    acetaminophen (TYLENOL) tablet 650 mg  650 mg Oral Q6H PRN Ricarda Frame, MD   650 mg at 08/20/20 0026    Or    acetaminophen (TYLENOL) suppository 650 mg  650 mg Rectal Q6H PRN Jerral Ralph  E, MD        apixaban (ELIQUIS) tablet 5 mg  5 mg Oral Q12H [facility] Ricarda Frame, MD   5 mg at 08/19/20 2234    aspirin EC tablet 81 mg  81 mg Oral Daily Ricarda Frame, MD        atorvastatin (LIPITOR) tablet 20 mg  20 mg Oral Daily Ricarda Frame, MD        glucagon (rDNA) (GLUCAGEN) injection 1 mg  1 mg Intramuscular PRN Ricarda Frame, MD        And    dextrose 5 % bolus 250 mL  250 mL Intravenous PRN Ricarda Frame, MD        And    dextrose 50 % bolus 25 g  25 g Intravenous PRN Ricarda Frame, MD        And    dextrose (D10W) 10% bolus 250 mL  25 g Intravenous PRN Ricarda Frame, MD        dilTIAZem (CARDIZEM) 125 mg in sodium chloride 0.7% 125 mL premix  2.5-15 mg/hr Intravenous Continuous Maurine Minister, MD 5 mL/hr at 08/20/20 0244 5 mg/hr at 08/20/20 0244    FLUoxetine (PROzac) capsule  40 mg  40 mg Oral Daily Ricarda Frame, MD        gabapentin (NEURONTIN) capsule 800 mg  800 mg Oral TID Ricarda Frame, MD   800 mg at 08/20/20 0550    glipiZIDE (GLUCOTROL) tablet 5 mg  5 mg Oral BID AC Ricarda Frame, MD        HYDROmorphone (DILAUDID) injection 0.2 mg  0.2 mg Intravenous Q2H PRN Ricarda Frame, MD        insulin lispro injection 1-3 Units  1-3 Units Subcutaneous QHS Manson Passey, [place] L, FNP        insulin lispro injection 1-5 Units  1-5 Units Subcutaneous TID AC Collene Massimino L, FNP        lisinopril (ZESTRIL) tablet 20 mg  20 mg Oral Daily Ricarda Frame, MD        magnesium sulfate 1g in dextrose 5% IVPB (premix)  1 g Intravenous PRN Ricarda Frame, MD        melatonin tablet 3 mg  3 mg Oral QHS PRN Ricarda Frame, MD   3 mg at 08/19/20 2233    metFORMIN (GLUCOPHAGE) tablet 1,000 mg  1,000 mg Oral BID Meals Ricarda Frame, MD        methocarbamol (ROBAXIN) tablet 500 mg  500 mg Oral QID Ricarda Frame, MD   500 mg at 08/19/20 2233    metoprolol tartrate (LOPRESSOR) tablet 25 mg  25 mg Oral Q12H [facility] Ricarda Frame, MD   25 mg at 08/19/20 2233    naloxone [facility]) injection 0.2 mg  0.2 mg Intravenous PRN Ricarda Frame, MD        oxyCODONE (ROXICODONE) immediate release tablet 5 mg  5 mg Oral Q6H PRN Ricarda Frame, MD   5 mg at 08/19/20 2234    potassium & sodium phosphates (PHOS-NAK) 280-160-250 MG packet 2 packet  2 packet Oral PRN Ricarda Frame, MD        potassium chloride (KLOR-CON) CR tablet 0-40 mEq  0-40 mEq Oral PRN Ricarda Frame, MD        And    potassium chloride 10 mEq in 100 mL IVPB (premix)  10 mEq Intravenous PRN Jerral Ralph  E, MD        tamsulosin (FLOMAX) capsule 0.4 mg  0.4 mg Oral Daily after dinner Ricarda Frame, MD   0.4 mg at 08/19/20 2233       Labs:     Labs (last 72 hours):    Recent Labs   Lab 08/20/20  0509 08/19/20  1355   WBC 8.26 10.47*   Hgb 14.7 15.8   Hematocrit 43.1 47.2   Platelets 174 195       Recent Labs   Lab 08/19/20  1355   PT 10.6   PT INR 0.9     Recent Labs   Lab 08/20/20  0509 08/19/20  1355   Sodium 134* 135*   Potassium 4.2 4.5   Chloride 103 102   CO2 22 21*   BUN 18.0 13.0   Creatinine 1.3 1.2   Calcium 8.7 9.1   Albumin 2.8* 3.6   Protein, Total 5.7* 6.6   Bilirubin, Total 0.4 0.5   Alkaline Phosphatase 100 129*   ALT 11 14   AST (SGOT) 14 17   Glucose 272* 301*                  Imaging, reviewed:  XR Chest  AP Portable    Result Date: 08/19/2020  1. No acute disease. Kennyth Lose, MD  08/19/2020 3:35 PM     Signed by: Marius Ditch, FNP    Contact Information:  Monday to Friday 7am-6pm:   Primary Contact- Medicine NP/PA- Specific hours/contact information per Sticky Note   Secondary- Medicine Attending  [facility] Hospitalist at pager [phone]  Monday to Friday after 6pm-   Primary Contact- On-Call Hospitalist at pager [phone]  Weekends-   Primary Contact- Medicine Attending  Secondary- On-Call Hospitalist at pager [phone]

## 2020-08-20 NOTE — Plan of Care (Signed)
Cardiac symptoms: Denies  Diet: Cardiac  Fluid Restriction: N/A  Mobility: Standby        Nursing Plan:   -Monitor HR & rhythm  -Echo ordered    Problem: Safety  Goal: Patient will be free from injury during hospitalization  Outcome: Progressing  Goal: Patient will be free from infection during hospitalization  Outcome: Progressing     Problem: Pain  Goal: Pain at adequate level as identified by patient  Outcome: Progressing     Problem: Side Effects from Pain Analgesia  Goal: Patient will experience minimal side effects of analgesic therapy  Outcome: Progressing     Problem: Discharge Barriers  Goal: Patient will be discharged home or other facility with appropriate resources  Outcome: Progressing     Problem: Psychosocial and Spiritual Needs  Goal: Demonstrates ability to cope with hospitalization/illness  Outcome: Progressing     Problem: Moderate/High Fall Risk Score >5  Goal: Patient will remain free of falls  Outcome: Progressing

## 2020-08-21 ENCOUNTER — Encounter: Payer: Self-pay | Admitting: Internal Medicine

## 2020-08-21 DIAGNOSIS — Z720 Tobacco use: Secondary | ICD-10-CM

## 2020-08-21 DIAGNOSIS — E785 Hyperlipidemia, unspecified: Secondary | ICD-10-CM

## 2020-08-21 DIAGNOSIS — I10 Essential (primary) hypertension: Secondary | ICD-10-CM

## 2020-08-21 DIAGNOSIS — E1140 Type 2 diabetes mellitus with diabetic neuropathy, unspecified: Secondary | ICD-10-CM

## 2020-08-21 DIAGNOSIS — I482 Chronic atrial fibrillation, unspecified: Secondary | ICD-10-CM

## 2020-08-21 LAB — CBC AND DIFFERENTIAL
Absolute NRBC: 0 10*3/uL (ref 0.00–0.00)
Basophils Absolute Automated: 0.14 10*3/uL — ABNORMAL HIGH (ref 0.00–0.08)
Basophils Automated: 1.1 %
Eosinophils Absolute Automated: 1.09 10*3/uL — ABNORMAL HIGH (ref 0.00–0.44)
Eosinophils Automated: 8.6 %
Hematocrit: 39 % (ref 37.6–49.6)
Hgb: 13.6 g/dL (ref 12.5–17.1)
Immature Granulocytes Absolute: 0.04 10*3/uL (ref 0.00–0.07)
Immature Granulocytes: 0.3 %
Lymphocytes Absolute Automated: 4.27 10*3/uL — ABNORMAL HIGH (ref 0.42–3.22)
Lymphocytes Automated: 33.7 %
MCH: 29 pg (ref 25.1–33.5)
MCHC: 34.9 g/dL (ref 31.5–35.8)
MCV: 83.2 fL (ref 78.0–96.0)
MPV: 9.7 fL (ref 8.9–12.5)
Monocytes Absolute Automated: 1.97 10*3/uL — ABNORMAL HIGH (ref 0.21–0.85)
Monocytes: 15.6 %
Neutrophils Absolute: 5.15 10*3/uL (ref 1.10–6.33)
Neutrophils: 40.7 %
Nucleated RBC: 0 /100 WBC (ref 0.0–0.0)
Platelets: 162 10*3/uL (ref 142–346)
RBC: 4.69 10*6/uL (ref 4.20–5.90)
RDW: 14 % (ref 11–15)
WBC: 12.66 10*3/uL — ABNORMAL HIGH (ref 3.10–9.50)

## 2020-08-21 LAB — B-TYPE NATRIURETIC PEPTIDE: B-Natriuretic Peptide: 105 pg/mL — ABNORMAL HIGH (ref 0–100)

## 2020-08-21 LAB — BASIC METABOLIC PANEL
Anion Gap: 11 (ref 5.0–15.0)
BUN: 29 mg/dL — ABNORMAL HIGH (ref 9.0–28.0)
CO2: 18 mEq/L — ABNORMAL LOW (ref 22–29)
Calcium: 8.7 mg/dL (ref 7.9–10.2)
Chloride: 103 mEq/L (ref 100–111)
Creatinine: 1.5 mg/dL — ABNORMAL HIGH (ref 0.7–1.3)
Glucose: 148 mg/dL — ABNORMAL HIGH (ref 70–100)
Potassium: 4.1 mEq/L (ref 3.5–5.1)
Sodium: 132 mEq/L — ABNORMAL LOW (ref 136–145)

## 2020-08-21 LAB — GLUCOSE WHOLE BLOOD - POCT
Whole Blood Glucose POCT: 193 mg/dL — ABNORMAL HIGH (ref 70–100)
Whole Blood Glucose POCT: 251 mg/dL — ABNORMAL HIGH (ref 70–100)

## 2020-08-21 LAB — MAGNESIUM: Magnesium: 1.7 mg/dL (ref 1.6–2.6)

## 2020-08-21 LAB — GFR: EGFR: 45.9

## 2020-08-21 MED ORDER — ASPIRIN 81 MG PO TBEC
81.0000 mg | DELAYED_RELEASE_TABLET | Freq: Every day | ORAL | 0 refills | Status: AC
Start: 2020-08-21 — End: 2020-09-20

## 2020-08-21 MED ORDER — METOPROLOL SUCCINATE ER 100 MG PO TB24
100.0000 mg | ORAL_TABLET | Freq: Every day | ORAL | 0 refills | Status: AC
Start: 2020-08-21 — End: 2020-09-20

## 2020-08-21 MED ORDER — LANCETS 30G MISC
0 refills | Status: DC
Start: 2020-08-21 — End: 2020-08-22

## 2020-08-21 MED ORDER — OMEPRAZOLE 40 MG PO CPDR
40.0000 mg | DELAYED_RELEASE_CAPSULE | Freq: Every day | ORAL | 0 refills | Status: AC
Start: 2020-08-21 — End: 2020-09-20

## 2020-08-21 MED ORDER — BACLOFEN 10 MG PO TABS
10.0000 mg | ORAL_TABLET | Freq: Three times a day (TID) | ORAL | 0 refills | Status: AC
Start: 2020-08-21 — End: 2020-09-20

## 2020-08-21 MED ORDER — METFORMIN HCL 1000 MG PO TABS
1000.0000 mg | ORAL_TABLET | Freq: Two times a day (BID) | ORAL | 0 refills | Status: AC
Start: 2020-08-21 — End: 2020-09-20

## 2020-08-21 MED ORDER — RELION ULTIMA GLUCOSE SYSTEM W/DEVICE KIT
PACK | 0 refills | Status: DC
Start: 2020-08-21 — End: 2021-04-19

## 2020-08-21 MED ORDER — METHOCARBAMOL 500 MG PO TABS
500.0000 mg | ORAL_TABLET | Freq: Four times a day (QID) | ORAL | 0 refills | Status: DC
Start: 2020-08-21 — End: 2020-08-21

## 2020-08-21 MED ORDER — RELION ULTIMA TEST VI STRP
ORAL_STRIP | 0 refills | Status: DC
Start: 2020-08-21 — End: 2021-04-19

## 2020-08-21 MED ORDER — ATORVASTATIN CALCIUM 20 MG PO TABS
20.0000 mg | ORAL_TABLET | Freq: Every day | ORAL | 0 refills | Status: DC
Start: 2020-08-21 — End: 2020-09-20

## 2020-08-21 MED ORDER — LANCETS 33G MISC
0 refills | Status: DC
Start: 2020-08-21 — End: 2020-08-22

## 2020-08-21 MED ORDER — GLUCOSE BLOOD VI STRP
ORAL_STRIP | 0 refills | Status: DC
Start: 2020-08-21 — End: 2020-08-22

## 2020-08-21 MED ORDER — FLUOXETINE HCL 40 MG PO CAPS
40.0000 mg | ORAL_CAPSULE | Freq: Every day | ORAL | 0 refills | Status: AC
Start: 2020-08-21 — End: 2020-09-20

## 2020-08-21 MED ORDER — GLIPIZIDE 5 MG PO TABS
5.0000 mg | ORAL_TABLET | Freq: Two times a day (BID) | ORAL | 0 refills | Status: AC
Start: 2020-08-21 — End: 2020-09-20

## 2020-08-21 MED ORDER — RELION ULTRA THIN PLUS LANCETS MISC
0 refills | Status: DC
Start: 2020-08-21 — End: 2021-04-19

## 2020-08-21 MED ORDER — TAMSULOSIN HCL 0.4 MG PO CAPS
0.4000 mg | ORAL_CAPSULE | Freq: Every day | ORAL | 0 refills | Status: AC
Start: 2020-08-21 — End: 2020-09-20

## 2020-08-21 MED ORDER — ELIQUIS 5 MG PO TABS
5.0000 mg | ORAL_TABLET | Freq: Two times a day (BID) | ORAL | 0 refills | Status: DC
Start: 2020-08-21 — End: 2020-09-20

## 2020-08-21 MED ORDER — GABAPENTIN 800 MG PO TABS
800.0000 mg | ORAL_TABLET | Freq: Three times a day (TID) | ORAL | 0 refills | Status: DC
Start: 2020-08-21 — End: 2020-09-04

## 2020-08-21 MED ORDER — ONETOUCH ULTRA 2 W/DEVICE KIT
PACK | 0 refills | Status: DC
Start: 2020-08-21 — End: 2020-08-22

## 2020-08-21 NOTE — Plan of Care (Signed)
Problem: Safety  Goal: Patient will be free from injury during hospitalization  Outcome: Progressing  Goal: Patient will be free from infection during hospitalization  Outcome: Progressing     Problem: Pain  Goal: Pain at adequate level as identified by patient  Outcome: Progressing     Problem: Side Effects from Pain Analgesia  Goal: Patient will experience minimal side effects of analgesic therapy  Outcome: Progressing     Problem: Discharge Barriers  Goal: Patient will be discharged home or other facility with appropriate resources  Outcome: Progressing     Problem: Psychosocial and Spiritual Needs  Goal: Demonstrates ability to cope with hospitalization/illness  Outcome: Progressing     Problem: Moderate/High Fall Risk Score >5  Goal: Patient will remain free of falls  Outcome: Progressing

## 2020-08-21 NOTE — Discharge Summary (Signed)
MEDICINE DISCHARGE SUMMARY    Date Time: 08/21/20 9:52 AM  Patient Name: Scott Monroe, [name]  Attending Physician: Tiffany Kocher, MD  Primary Care Physician: Marisa Sprinkles, MD    Date of Admission: 08/19/2020  Date of Discharge: 08/21/2020    Discharge Diagnoses:     Principal Diagnosis: Atrial fibrillation w/ RVR CHA2DS2-VASc at least 3-now rate controlled  Essential hypertension  Hyperlipidemia  Poorly controlled DMII a1c 11.7  Acute on chronic back pain  Proteinuria  Glucosuria  Reactive leukocytosis-resolved  Acidosis-resolved  Hyponatremia-possibly pseudo related to hyperglycemia  Lack of access to medical care  Hospital Course:     Reason for admission/HPI:   Scott Monroe. is a 75 y.o. male with past medical history of A. Fib (noncompliant), diabetes, hypertension, hyperlipidemia and multiple other conditions who presented with back pain and was found to be in A. fib with RVR.  Patient has been off of his medications for at least 6 months due to homelessness, limited financial resources, and access to a doctor.  He has history of chronic back pain for which she has had multiple back surgeries along with a spinal cord stimulator but has not been operational for 2 years.  He states that his high heart rate does not "bother him" and he usually does not know when he is in RVR.  Denies chest pain, dyspnea, palpitations, headache, vision changes, dizziness, nausea or vomiting.  He complains of back pain, which is chronic, however in the last 24 hours, has been excruciating is why he presented to the ED.     Upon presentation, noted to be hemodynamically stable with heart rate of 156.  Laboratory data evident for mild leukocytosis (10.47), mild hyponatremia (135), normal anion gap, normal LFTs, normal creatinine, normal TSH, negative troponin, normal BNP, urinalysis negative for infection, chest x-ray negative for acute pathology, EKG evident for RBBB and A. fib with RVR without acute ST changes.     In the  ED, patient was started on IV diltiazem which managed to control heart rate.  Patient was also administered one-time 5/325 mg Percocet and 500 cc NS bolus.     Hospital Course:   75 y.o. male w history of HTN, HLD, DMII, atrial fibrillation, RLS, chronic back pain s/p stimulator and multiple surgical interventions whom presented with acute on chronic back pain and found to be in afib RVR in the context of being off of medications at least 6 months due to homelessness and lack of access to healthcare. He was started on diltiazem gtt and resumed on his prior home medications including eliquis. He has been weaned off of diltiazem gtt and rate controled on metoprolol 50mg  BID. Echocardiogram reassuring. Cardiology recommending discharge on current rate control. He will need close follow up with [facility] for further management an AC (no insurance).  In regards to his diabetes, his A1c was controlled on metformin and glipizide prior to losing insurance 6 months ago, will resume prior home dose as insulin might be complex given his current lack of insurance and lack of steady housing (living with ex wife but no long term plan)  Clinically he is stable for discharge and will need CM assistance with medication affordability and outpatient resources      Discharge Day Exam:  Temp:  [97.7 F (36.5 C)-98.8 F (37.1 C)] 98.3 F (36.8 C)  Heart Rate:  [56-110] 95  Resp Rate:  [14-16] 16  BP: (79-122)/(49-68) 122/66  General appearance: alert and oriented x3, no  acute distress  HEENT: normocephalic atraumatic, PERRLA, EOMI, moist mucous membranes   Neck:  supple  Lungs:  clear to auscultation bilaterally, no wheezes, rubs, or rhonchi  Heart:   irregular S1, S2, no murmurs rubs or gallops  Abdomen:  soft, nontender, nondistended, no rebound or guarding  Back:  no spinal tenderness  Extremities: no clubbing, cyanosis, or edema   Skin:  no rash  Neurologic:  normal mental status, CNs II-XII intact, 5/5 strength throughout, normal  sensation      Disposition:   Home with family      Follow Up Plan for Patient:     Patient was instructed to follow up with:      Follow-up Information       [facility] Follow up in 3 day(s).    Specialty: Family Medicine  Contact information:  [address]  [place] [address]  [phone]  Additional information:  From the [place]:   Take [address]. Take [address] to merge onto [address] toward [address].    Turn right onto [address]. Your destination will be on the left.       From the [place]:   Take [address] towards [address]. Merge onto [address] Take exit [address]A to merge onto [address]/   [facility] toward [address]. Turn right onto [address]. Your destination will be on the left.              [facility] [place]. Schedule an appointment as soon as possible for a visit.    Specialty: Cardiology  Contact information:  [address]  [address]  [place] [address]  [phone]  Additional information:  [address]   Go [place] on [address].    Turn left onto [address]                          No prior PCP, [facility] to [facility]     Discharge Instructions:     Diet:cardiac, low carb    Activity/Weight Bearing Status:as tolerated    Complete instructions and follow up are in the patient's After Visit Summary    Recent Labs:       Results       Procedure Component Value Units Date/Time    Basic Metabolic Panel [[id number]]  (Abnormal) Collected: 08/21/20 0418    Specimen: Blood Updated: 08/21/20 0808     Glucose 148 mg/dL      BUN 44.0 mg/dL      Creatinine 1.5 mg/dL      Calcium 8.7 mg/dL      Sodium 102 mEq/L      Potassium 4.1 mEq/L      Chloride 103 mEq/L      CO2 18 mEq/L      Anion Gap 11.0    GFR [[id number]] Collected: 08/21/20 0418     Updated: 08/21/20 0808     EGFR 45.9       Glucose Whole Blood - POCT [[id number]]  (Abnormal) Collected: 08/21/20 0719      Updated: 08/21/20 0724     Whole Blood Glucose POCT 193 mg/dL     CBC and differential [[id number]]  (Abnormal) Collected: 08/21/20 0418    Specimen: Blood Updated: 08/21/20 0718     WBC 12.66 x10 3/uL      Hgb 13.6 g/dL      Hematocrit  39.0 %      Platelets 162 x10 3/uL      RBC 4.69 x10 6/uL      MCV 83.2 fL      MCH 29.0 pg      MCHC 34.9 g/dL      RDW 14 %      MPV 9.7 fL      Neutrophils 40.7 %      Lymphocytes Automated 33.7 %      Monocytes 15.6 %      Eosinophils Automated 8.6 %      Basophils Automated 1.1 %      Immature Granulocytes 0.3 %      Nucleated RBC 0.0 /100 WBC      Neutrophils Absolute 5.15 x10 3/uL      Lymphocytes Absolute Automated 4.27 x10 3/uL      Monocytes Absolute Automated 1.97 x10 3/uL      Eosinophils Absolute Automated 1.09 x10 3/uL      Basophils Absolute Automated 0.14 x10 3/uL      Immature Granulocytes Absolute 0.04 x10 3/uL      Absolute NRBC 0.00 x10 3/uL     Magnesium [[id number]] Collected: 08/21/20 0418    Specimen: Blood Updated: 08/21/20 0633     Magnesium 1.7 mg/dL     B-type Natriuretic Peptide [[id number]]  (Abnormal) Collected: 08/21/20 0418    Specimen: Blood Updated: 08/21/20 0620     B-Natriuretic Peptide 105 pg/mL     Glucose Whole Blood - POCT [[id number]]  (Abnormal) Collected: 08/20/20 2113     Updated: 08/20/20 2145     Whole Blood Glucose POCT 121 mg/dL     Glucose Whole Blood - POCT [[id number]]  (Abnormal) Collected: 08/20/20 1637     Updated: 08/20/20 1657     Whole Blood Glucose POCT 114 mg/dL     Glucose Whole Blood - POCT [[id number]]  (Abnormal) Collected: 08/20/20 1121     Updated: 08/20/20 1152     Whole Blood Glucose POCT 354 mg/dL     Glucose Whole Blood - POCT [[id number]]  (Abnormal) Collected: 08/20/20 1123     Updated: 08/20/20 1152     Whole Blood Glucose POCT 388 mg/dL     Hemoglobin Z3G [[id number]]  (Abnormal) Collected: 08/20/20 0509    Specimen: Blood Updated: 08/20/20 1129     Hemoglobin A1C 11.7 %      Average Estimated Glucose 289.1 mg/dL      Narrative:      This is NOT the correct Test for Patients with  Hemoglobinopathy.    Glucose Whole Blood - POCT [[id number]]  (Abnormal) Collected: 08/20/20 0908     Updated: 08/20/20 0911     Whole Blood Glucose POCT 242 mg/dL     Magnesium [[id number]] Collected: 08/20/20 0508    Specimen: Blood Updated: 08/20/20 0846     Magnesium 1.7 mg/dL     Narrative:      This is NOT the correct Test for Patients with  Hemoglobinopathy.    CBC and differential [[id number]]  (Abnormal) Collected: 08/20/20 0509    Specimen: Blood Updated: 08/20/20 0649     WBC 8.26 x10 3/uL      Hgb 14.7 g/dL      Hematocrit 60.1 %      Platelets 174 x10 3/uL      RBC 5.13 x10 6/uL      MCV 84.0 fL      MCH 28.7 pg      MCHC 34.1 g/dL  RDW 13 %      MPV 9.7 fL      Neutrophils 36.2 %      Lymphocytes Automated 41.3 %      Monocytes 10.0 %      Eosinophils Automated 10.3 %      Basophils Automated 1.7 %      Immature Granulocytes 0.5 %      Nucleated RBC 0.0 /100 WBC      Neutrophils Absolute 2.99 x10 3/uL      Lymphocytes Absolute Automated 3.41 x10 3/uL      Monocytes Absolute Automated 0.83 x10 3/uL      Eosinophils Absolute Automated 0.85 x10 3/uL      Basophils Absolute Automated 0.14 x10 3/uL      Immature Granulocytes Absolute 0.04 x10 3/uL      Absolute NRBC 0.00 x10 3/uL     Comprehensive metabolic panel [[id number]]  (Abnormal) Collected: 08/20/20 0509    Specimen: Blood Updated: 08/20/20 0621     Glucose 272 mg/dL      BUN 40.9 mg/dL      Creatinine 1.3 mg/dL      Sodium 811 mEq/L      Potassium 4.2 mEq/L      Chloride 103 mEq/L      CO2 22 mEq/L      Calcium 8.7 mg/dL      Protein, Total 5.7 g/dL      Albumin 2.8 g/dL      AST (SGOT) 14 U/L      ALT 11 U/L      Alkaline Phosphatase 100 U/L      Bilirubin, Total 0.4 mg/dL      Globulin 2.9 g/dL      Albumin/Globulin Ratio 1.0     Anion Gap 9.0    GFR [[id number]] Collected: 08/20/20 0509     Updated: 08/20/20 0621     EGFR 54.2       Urinalysis Reflex to Microscopic Exam- Reflex to  Culture [[id number]]  (Abnormal) Collected: 08/19/20 1505    Specimen: Urine, Clean Catch Updated: 08/19/20 1545     Urine Type Urine, Clean Ca     Color, UA Straw     Clarity, UA Clear     Specific Gravity UA 1.019     Urine pH 5.5     Leukocyte Esterase, UA Negative     Nitrite, UA Negative     Protein, UR 20= Trace     Glucose, UA >1000= 4+     Ketones UA Negative     Urobilinogen, UA Normal mg/dL      Bilirubin, UA Negative     Blood, UA Small     RBC, UA 6-10 /hpf      WBC, UA 0-5 /hpf      Squamous Epithelial Cells, Urine 0-5 /hpf      Granular Casts, UA 0 - 2 /lpf      Hyaline Casts, UA 0-2 /lpf      Urine Mucus Present    TSH [[id number]] Collected: 08/19/20 1355    Specimen: Blood Updated: 08/19/20 1505     TSH 0.54 uIU/mL     Magnesium [[id number]] Collected: 08/19/20 1355    Specimen: Blood Updated: 08/19/20 1449     Magnesium 1.6 mg/dL     GFR [[id number]] Collected: 08/19/20 1355     Updated: 08/19/20 1449     EGFR 59.4       Comprehensive metabolic panel [[id number]]  (Abnormal) Collected: 08/19/20 1355  Specimen: Blood Updated: 08/19/20 1449     Glucose 301 mg/dL      BUN 02.7 mg/dL      Creatinine 1.2 mg/dL      Sodium 253 mEq/L      Potassium 4.5 mEq/L      Chloride 102 mEq/L      CO2 21 mEq/L      Calcium 9.1 mg/dL      Protein, Total 6.6 g/dL      Albumin 3.6 g/dL      AST (SGOT) 17 U/L      ALT 14 U/L      Alkaline Phosphatase 129 U/L      Bilirubin, Total 0.5 mg/dL      Globulin 3.0 g/dL      Albumin/Globulin Ratio 1.2     Anion Gap 12.0    B-type Natriuretic Peptide [[id number]] Collected: 08/19/20 1355    Specimen: Blood Updated: 08/19/20 1444     B-Natriuretic Peptide 52 pg/mL     Troponin I [[id number]] Collected: 08/19/20 1355    Specimen: Blood Updated: 08/19/20 1444     Troponin I 0.01 ng/mL     Prothrombin time/INR [[id number]] Collected: 08/19/20 1355    Specimen: Blood Updated: 08/19/20 1432     PT 10.6 sec      PT INR 0.9    CBC and differential [[id number]]  (Abnormal) Collected: 08/19/20  1355    Specimen: Blood Updated: 08/19/20 1421     WBC 10.47 x10 3/uL      Hgb 15.8 g/dL      Hematocrit 16.0 %      Platelets 195 x10 3/uL      RBC 5.64 x10 6/uL      MCV 83.7 fL      MCH 28.0 pg      MCHC 33.5 g/dL      RDW 13 %      MPV 9.6 fL      Neutrophils 48.5 %      Lymphocytes Automated 26.0 %      Monocytes 14.2 %      Eosinophils Automated 9.3 %      Basophils Automated 1.6 %      Immature Granulocytes 0.4 %      Nucleated RBC 0.0 /100 WBC      Neutrophils Absolute 5.08 x10 3/uL      Lymphocytes Absolute Automated 2.72 x10 3/uL      Monocytes Absolute Automated 1.49 x10 3/uL      Eosinophils Absolute Automated 0.97 x10 3/uL      Basophils Absolute Automated 0.17 x10 3/uL      Immature Granulocytes Absolute 0.04 x10 3/uL      Absolute NRBC 0.00 x10 3/uL         .      Radiology performed:     XR Chest  AP Portable    Result Date: 08/19/2020  1. No acute disease. Kennyth Lose, MD  08/19/2020 3:35 PM       Consultations:     Treatment Team:   Attending Provider: Tiffany Kocher, MD  Consulting Physician: Garth Schlatter, MD    Discharge Medications:        Discharge Medication List        Taking      aspirin EC 81 MG EC tablet  Dose: 81 mg  Take 1 tablet (81 mg total) by mouth daily     atorvastatin 20 MG tablet  Dose: 20 mg  Commonly known as: LIPITOR  Take 1 tablet (20 mg total) by mouth daily     Eliquis 5 MG  Dose: 5 mg  Generic drug: apixaban  Take 1 tablet (5 mg total) by mouth 2 (two) times daily     FLUoxetine 40 MG capsule  Dose: 40 mg  Commonly known as: PROzac  Take 1 capsule (40 mg total) by mouth daily     gabapentin 800 MG tablet  Dose: 800 mg  Commonly known as: NEURONTIN  Take 1 tablet (800 mg total) by mouth 3 (three) times daily for 14 days     glipiZIDE 5 MG tablet  Dose: 5 mg  Commonly known as: GLUCOTROL  Take 1 tablet (5 mg total) by mouth 2 (two) times daily before meals     metFORMIN 1000 MG tablet  Dose: 1,000 mg  Commonly known as: GLUCOPHAGE  Take 1 tablet (1,000 mg total) by mouth 2  (two) times daily with meals     methocarbamol 500 MG tablet  Dose: 500 mg  Commonly known as: ROBAXIN  Take 1 tablet (500 mg total) by mouth 4 (four) times daily     metoprolol succinate 100 MG 24 hr tablet  Dose: 100 mg  Commonly known as: TOPROL-XL  Take 1 tablet (100 mg total) by mouth daily     omeprazole 40 MG capsule  Dose: 40 mg  Commonly known as: PriLOSEC  Take 1 capsule (40 mg total) by mouth daily     ReliOn Ultima Glucose System w/Device Kit  Use to test blood sugar 3 times per day Ok to substitute with brand of choice     ReliOn Ultima Test test strip  Generic drug: glucose blood  Use to test blood sugar 3 times per day Ok to substitute with brand of choice     ReliOn Ultra Thin Plus Lancets Misc  Use to test blood sugar 3 times per day Ok to substitute with brand of choice     tamsulosin 0.4 MG Caps  Dose: 0.4 mg  Commonly known as: FLOMAX  Take 1 capsule (0.4 mg total) by mouth Daily after dinner            STOP taking these medications      amLODIPine-atorvastatin 5-10 MG per tablet  Commonly known as: CADUET     lisinopril 20 MG tablet  Commonly known as: ZESTRIL     metoprolol tartrate 25 MG tablet  Commonly known as: LOPRESSOR     RANITIDINE 150 MAX STRENGTH PO     traZODone 100 MG tablet  Commonly known as: DESYREL              Immunizations provided:       There is no immunization history on file for this patient.    [facility]  Department of Medicine  P: [phone]  F: [phone]    Minutes spent coordinating discharge and reviewing discharge plan:35 minutes    Signed by: Marius Ditch, FNP    CC: Pcp, None, MD

## 2020-08-21 NOTE — Plan of Care (Addendum)
Shift Summary:  Pt has no order to d/c Cardizem gtt, and has been disconnected from the gtt even before the PM shift. Per day RN's report, Cardizem gtt has been d/ced.   Plan:  -Monitor heart rate  - Echo pending  - Cardiology following.    Problem: Safety  Goal: Patient will be free from injury during hospitalization  Outcome: Progressing  Flowsheets (Taken 08/21/2020 0211)  Patient will be free from injury during hospitalization:   Provide and maintain safe environment   Assess patient's risk for falls and implement fall prevention plan of care per policy   Use appropriate transfer methods   Ensure appropriate safety devices are available at the bedside   Hourly rounding   Assess for patients risk for elopement and implement Elopement Risk Plan per policy   Include patient/ family/ care giver in decisions related to safety     Problem: Psychosocial and Spiritual Needs  Goal: Demonstrates ability to cope with hospitalization/illness  Outcome: Progressing  Flowsheets (Taken 08/21/2020 0211)  Demonstrates ability to cope with hospitalizations/illness:   Encourage verbalization of feelings/concerns/expectations   Provide quiet environment   Encourage patient to set small goals for self   Assist patient to identify own strengths and abilities   Include patient/ patient care companion in decisions   Reinforce positive adaptation of new coping behaviors     Problem: Moderate/High Fall Risk Score >5  Goal: Patient will remain free of falls  Outcome: Progressing  Flowsheets (Taken 08/21/2020 0100)  Moderate Risk (6-13):   MOD-Place bedside commode and assistive devices out of sight when not in use   MOD-Apply bed exit alarm if patient is confused   MOD-Floor mat at bedside (where available) if appropriate

## 2020-08-21 NOTE — Progress Notes (Signed)
Initial Case Management Assessment and Discharge Planning  [facility]   Patient Name: Scott Monroe, Scott Monroe.   Date of Birth 11-Dec-1945   Attending Physician: Tiffany Kocher, MD   Primary Care Physician: Marisa Sprinkles, MD   Length of Stay 0   Reason for Consult / Chief Complaint Atrial fibrillation [I48.91]         Situation   Admission DX:   1. Atrial fibrillation        A/O Status: X 3    Patient admitted from: ER  Admission Status: observation    Health Care Agent: Child  Name: or ex wife         Background     Advanced directive:   <no information>    Code Status:   Full Code     Residence: One story home/apartment    PCP: PCP None, MD  Patient Contact:   [phone] (home)     There is no such number on file (mobile).     Emergency contact:   Extended Emergency Contact Information  Primary Emergency Contact: [facility]  Mobile Phone: [phone]  Relation: Daughter  Preferred language: English      ADL/IADL's: Independent  Previous Level of function: 7 Independent     DME: None    Pharmacy:     [facility] [address] ([address]), [place] - [address]  [address]  [place] ([place]) [place] [address]  Phone: [phone] Fax: [phone]    [facility] [place], [place] - [address]  [address] [place] [address]  Phone: [phone] Fax: [phone]      Prescription Coverage: Yes    Home Health: The patient is not currently receiving home health services.    Previous SNF/AR: na     COVID Vaccine Status: unknown    Date First IMM given: na  UAI on file?: No  Transport for discharge? Mode of transportation: Sales executive - Family/Friend to drive patient exwife to drive him home  Agreeable to Home with family post-discharge:  Yes     Assessment   Assessment completed at bedside with patient.    BARRIERS TO DISCHARGE: Patient has been staying with his exwife and her family.  He will return there after discharge, there is no immediate plan for him to leave.  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  He does not believe he has any service connection.  Writer encouraged him to follow up with the [employer] [place] about service connectivity and what benefits he is eligible for.    Patient receives $988.30 per month from [employer].  He has Medicare A, cannot afford Medicare B.   He is not sure what Medicare D plan he is on.   He has been [facility] [employer] [facility] to fill all of his prescriptions.  Will ask [place] to screen him for medicaid  Will enter an order for patient to follow up at [facility]   Recommendation   D/C Plan A: Home with family    Grace Blight MSW  Case Manager  [facility]  [phone]  [email or web address]

## 2020-08-21 NOTE — Discharge Instr - AVS First Page (Signed)
Reason for hospitalization:  Atrial fibrillation  Poorly controlled diabetes, a1c >11    Discharge Instructions:    -Please schedule a follow-up with the listed providers.  -Take your medications as prescribed. Discuss all medications with your PCP.  -If you develop fever, chest pain, shortness of breath, weakness, severe pain or any worsening/concerning symptoms, please call 911 and return to the ER for further evaluation.  -Please eat a heart healthy diet that is low in salt, fat, carbohydrate and high in protein and fiber. Exercise at least 3 times a week, if tolerable or as recommended by your PCP.  -It was a pleasure caring for you here at [facility]. The internal medicine team wishes you well!

## 2020-08-21 NOTE — Progress Notes (Signed)
[facility] PROGRESS NOTE     Date Time:    08/21/20    9:29 AM  Patient Name: Scott Monroe.    LOS: 0 days     Subjective:  No cardiac events.    Scheduled Meds:   apixaban, 5 mg, Oral, Q12H SCH  aspirin EC, 81 mg, Oral, Daily  atorvastatin, 20 mg, Oral, Daily  FLUoxetine, 40 mg, Oral, Daily  gabapentin, 800 mg, Oral, TID  insulin glargine, 10 Units, Subcutaneous, QAM  insulin lispro, 1-3 Units, Subcutaneous, QHS  insulin lispro, 1-5 Units, Subcutaneous, TID AC  insulin lispro, 5 Units, Subcutaneous, TID AC  methocarbamol, 500 mg, Oral, QID  metoprolol tartrate, 25 mg, Oral, Once  metoprolol tartrate, 50 mg, Oral, Q12H SCH  tamsulosin, 0.4 mg, Oral, Daily after dinner     Continuous Infusions:    dilTIAZem Stopped (08/20/20 1259)     PRN Meds: acetaminophen **OR** acetaminophen, Nursing communication: Adult Hypoglycemia Treatment Algorithm **AND** glucagon (rDNA) **AND** dextrose **AND** dextrose **AND** dextrose, HYDROmorphone, magnesium sulfate, melatonin, naloxone, oxyCODONE, potassium & sodium phosphates, potassium chloride **AND** potassium chloride    Objective:  Physical Exam:  BP 122/66   Pulse 95   Temp 98.3 F (36.8 C) (Oral)   Resp 16   Ht 1.702 m (5\' 7" )   Wt 70.3 kg (154 lb 14.4 oz)   SpO2 96%   BMI 24.26 kg/m      Intake/Output Summary (Last 24 hours) at 08/21/2020 0700  Last data filed at 08/21/2020 0200  Gross per 24 hour   Intake 400 ml   Output 600 ml   Net -200 ml     Wt Readings from Last 1 Encounters:   08/21/20 0500 70.3 kg (154 lb 14.4 oz)   08/20/20 0504 67.8 kg (149 lb 8 oz)   08/19/20 2144 68.2 kg (150 lb 6.4 oz)   08/19/20 1344 68 kg (150 lb)     General: awake, alert, oriented x 3, no acute distress.  Cardiovascular: irregularly irregular, no murmurs, rubs or gallops     Lungs: clear to auscultation bilaterally, without wheezing, rhonchi, or rales  Abdomen: soft, non-tender, non-distended, no hepatosplenomegaly, normoactive bowel sounds  Extremities: no clubbing,  cyanosis, or edema     Lab Review:   Recent Labs     08/21/20  0418 08/20/20  0509 08/19/20  1355   WBC 12.66* 8.26 10.47*   Hgb 13.6 14.7 15.8   Hematocrit 39.0 43.1 47.2   Platelets 162 174 195     Recent Labs     08/21/20  0418 08/19/20  1355   Troponin I  --  0.01   B-Natriuretic Peptide 105* 52    Recent Labs     08/21/20  0418 08/20/20  0509 08/20/20  0508 08/19/20  1355   Sodium 132* 134*  --  135*   Potassium 4.1 4.2  --  4.5   CO2 18* 22  --  21*   BUN 29.0* 18.0  --  13.0   Creatinine 1.5* 1.3  --  1.2   Glucose 148* 272*  --  301*   Magnesium 1.7  --  1.7 1.6    Estimated Creatinine Clearance: 41.6 mL/min (A) (based on SCr of 1.5 mg/dL (H)).   Recent Labs     08/19/20  1355   PT INR 0.9   TSH 0.54        Telemetry: AF rates 90-120     Imaging:  Radiology Results (24  Hour)       ** No results found for the last 24 hours. **            Assessment/Plan:   Scott Monroe. is a 75 y.o. male with:    # Chronic atrial fibrillation  # DM2  # HTN  # HLD    The patient presentsed with back pain and was admitted for further treatment of AF with RVR. Of note, has not taken medicines for several years and is working on obtaining prescription insurance.  -continue metoprolol  -continue apixaban  -stop aspirin  -continue atorvastatin  -discontinue lisinopril given relative hypotension  -f/u with [employer]    Signed by: Garth Schlatter, MD, [facility]  [facility]   [facility] NP Spectralink [phone] or [phone] (M-F 8 am-5 pm)   [facility] Spectralink [phone]  (M-F 8 am-5 pm)  After Hours On-Call Physician: [phone]

## 2020-08-21 NOTE — UM Notes (Signed)
Observation Review 50/51:    75 year old male admitted to [facility] as observation on 8/5 for afib with RVR.  Patient presents with back pain was found to be in A. fib with RVR.  Found to have very mild leukocytosis, normal TSH, negative troponin, normal BNP, chest x-ray negative for acute pathology, EKG evident for A. fib with RVR and RBBB with no acute ST changes, and urinalysis negative for infection    Vitals:  Temp 98.8  HR 110  RR 16  79/49  95% on RA    Labs:  WBC 12.66  Bun 29  Creatinine 1.5    Plan:   -continue metoprolol  -continue apixaban  -stop aspirin  -continue atorvastatin  -discontinue lisinopril given relative hypotension  -f/u with [employer]    Current Facility-Administered Medications   Medication Dose Route Frequency Last Rate Last Admin    acetaminophen (TYLENOL) tablet 650 mg  650 mg Oral Q6H PRN   650 mg at 08/20/20 0026    Or    acetaminophen (TYLENOL) suppository 650 mg  650 mg Rectal Q6H PRN        apixaban (ELIQUIS) tablet 5 mg  5 mg Oral Q12H SCH   5 mg at 08/21/20 0854    aspirin EC tablet 81 mg  81 mg Oral Daily   81 mg at 08/21/20 0854    atorvastatin (LIPITOR) tablet 20 mg  20 mg Oral Daily   20 mg at 08/21/20 0854    glucagon (rDNA) (GLUCAGEN) injection 1 mg  1 mg Intramuscular PRN        And    dextrose 5 % bolus 250 mL  250 mL Intravenous PRN        And    dextrose 50 % bolus 25 g  25 g Intravenous PRN        And    dextrose (D10W) 10% bolus 250 mL  25 g Intravenous PRN        dilTIAZem (CARDIZEM) 125 mg in sodium chloride 0.7% 125 mL premix  2.5-15 mg/hr Intravenous Continuous   Stopped at 08/20/20 1259    FLUoxetine (PROzac) capsule 40 mg  40 mg Oral Daily   40 mg at 08/21/20 0854    gabapentin (NEURONTIN) capsule 800 mg  800 mg Oral TID   800 mg at 08/21/20 0981    HYDROmorphone (DILAUDID) injection 0.2 mg  0.2 mg Intravenous Q2H PRN        insulin glargine (LANTUS) injection 10 Units  10 Units Subcutaneous QAM   10 Units at 08/21/20 0855    insulin lispro injection 1-3 Units  1-3  Units Subcutaneous QHS        insulin lispro injection 1-5 Units  1-5 Units Subcutaneous TID AC   1 Units at 08/21/20 0855    insulin lispro injection 5 Units  5 Units Subcutaneous TID AC   5 Units at 08/21/20 0854    magnesium sulfate 1g in dextrose 5% IVPB (premix)  1 g Intravenous PRN   Stopped at 08/21/20 0936    melatonin tablet 3 mg  3 mg Oral QHS PRN   3 mg at 08/19/20 2233    methocarbamol (ROBAXIN) tablet 500 mg  500 mg Oral QID   500 mg at 08/21/20 0854    metoprolol tartrate (LOPRESSOR) tablet 25 mg  25 mg Oral Once        metoprolol tartrate (LOPRESSOR) tablet 50 mg  50 mg Oral Q12H SCH   50  mg at 08/21/20 0853    naloxone [facility]) injection 0.2 mg  0.2 mg Intravenous PRN        oxyCODONE (ROXICODONE) immediate release tablet 5 mg  5 mg Oral Q6H PRN   5 mg at 08/19/20 2234    potassium & sodium phosphates (PHOS-NAK) 280-160-250 MG packet 2 packet  2 packet Oral PRN        potassium chloride (KLOR-CON) CR tablet 0-40 mEq  0-40 mEq Oral PRN        And    potassium chloride 10 mEq in 100 mL IVPB (premix)  10 mEq Intravenous PRN        tamsulosin (FLOMAX) capsule 0.4 mg  0.4 mg Oral Daily after dinner   0.4 mg at 08/19/20 2233       UTILIZATION REVIEW CONTACT: Name: Diona Browner, RN, CM  Clinical Case Manager  - Utilization Review  [facility]  Address:  [address] [place], [place]  [address]  NPI:   [id number]  Tax ID:  [id number]  Direct: [phone] (Voicemail only)  Main: [phone]  Fax: [phone]  Email: [email or web address]     Please use fax number [phone] to provide authorization for hospital services or to request additional information.

## 2020-08-21 NOTE — Consults (Signed)
See CM note    Auriana Scalia MSW  Case Manager  [facility]  [phone]  [email or web address]

## 2020-08-22 ENCOUNTER — Encounter (INDEPENDENT_AMBULATORY_CARE_PROVIDER_SITE_OTHER): Payer: Self-pay

## 2020-08-22 MED ORDER — ONETOUCH ULTRA 2 W/DEVICE KIT
PACK | 0 refills | Status: DC
Start: 2020-08-22 — End: 2020-08-22

## 2020-08-22 MED ORDER — ONETOUCH ULTRA 2 W/DEVICE KIT
PACK | 0 refills | Status: DC
Start: 2020-08-22 — End: 2021-04-19

## 2020-08-22 MED ORDER — LANCETS 33G MISC
0 refills | Status: DC
Start: 2020-08-22 — End: 2021-04-19

## 2020-08-22 MED ORDER — GLUCOSE BLOOD VI STRP
ORAL_STRIP | 0 refills | Status: DC
Start: 2020-08-22 — End: 2020-08-22

## 2020-08-22 MED ORDER — LANCETS 30G MISC
0 refills | Status: DC
Start: 2020-08-22 — End: 2021-04-19

## 2020-08-22 MED ORDER — GLUCOSE BLOOD VI STRP
ORAL_STRIP | 0 refills | Status: DC
Start: 2020-08-22 — End: 2021-04-19

## 2020-08-22 NOTE — Plan of Care (Signed)
Medicare B didn't pay for diabetes testing supplies at our pharmacy, have sent to [facility] pharmacy electronically

## 2020-08-22 NOTE — Progress Notes (Signed)
[facility]):     Received a referral to schedule a follow up appointment with the [facility].  Left patient a voicemail and provided main clinic phone number.  Requested patient return call to schedule a follow up appointment as soon as possible.       Gloria Giron  Patient Access II  [facility]  ([facility])  [address]  [place], [place] [address]    [phone] telephone  [phone] secured fax

## 2020-08-24 ENCOUNTER — Other Ambulatory Visit: Payer: Self-pay

## 2020-08-24 DIAGNOSIS — C7A8 Other malignant neuroendocrine tumors: Secondary | ICD-10-CM

## 2020-08-25 ENCOUNTER — Inpatient Hospital Stay: Payer: Medicare Other

## 2020-08-25 ENCOUNTER — Other Ambulatory Visit: Payer: Self-pay

## 2020-08-25 VITALS — BP 111/72 | HR 88 | Temp 97.9°F | Resp 17

## 2020-08-25 DIAGNOSIS — Z5111 Encounter for antineoplastic chemotherapy: Secondary | ICD-10-CM | POA: Diagnosis not present

## 2020-08-25 DIAGNOSIS — C7A8 Other malignant neuroendocrine tumors: Secondary | ICD-10-CM

## 2020-08-25 DIAGNOSIS — M2550 Pain in unspecified joint: Secondary | ICD-10-CM | POA: Diagnosis not present

## 2020-08-25 DIAGNOSIS — C61 Malignant neoplasm of prostate: Secondary | ICD-10-CM | POA: Diagnosis not present

## 2020-08-25 DIAGNOSIS — C7951 Secondary malignant neoplasm of bone: Secondary | ICD-10-CM

## 2020-08-25 DIAGNOSIS — D509 Iron deficiency anemia, unspecified: Secondary | ICD-10-CM | POA: Diagnosis not present

## 2020-08-25 DIAGNOSIS — K909 Intestinal malabsorption, unspecified: Secondary | ICD-10-CM | POA: Diagnosis not present

## 2020-08-25 DIAGNOSIS — R531 Weakness: Secondary | ICD-10-CM | POA: Diagnosis not present

## 2020-08-25 LAB — CMP (CANCER CENTER ONLY)
ALT: 15 U/L (ref 0–44)
AST: 14 U/L — ABNORMAL LOW (ref 15–41)
Albumin: 3.8 g/dL (ref 3.5–5.0)
Alkaline Phosphatase: 56 U/L (ref 38–126)
Anion gap: 10 (ref 5–15)
BUN: 23 mg/dL (ref 8–23)
CO2: 25 mmol/L (ref 22–32)
Calcium: 9.3 mg/dL (ref 8.9–10.3)
Chloride: 101 mmol/L (ref 98–111)
Creatinine: 1.6 mg/dL — ABNORMAL HIGH (ref 0.61–1.24)
GFR, Estimated: 45 mL/min — ABNORMAL LOW (ref >60)
Glucose, Bld: 127 mg/dL — ABNORMAL HIGH (ref 70–99)
Potassium: 4.1 mmol/L (ref 3.5–5.1)
Sodium: 136 mmol/L (ref 135–145)
Total Bilirubin: 0.2 mg/dL — ABNORMAL LOW (ref 0.3–1.2)
Total Protein: 5.8 g/dL — ABNORMAL LOW (ref 6.5–8.1)

## 2020-08-25 LAB — CBC WITH DIFFERENTIAL (CANCER CENTER ONLY)
Abs Immature Granulocytes: 0.09 10*3/uL — ABNORMAL HIGH (ref 0.00–0.07)
Basophils Absolute: 0 10*3/uL (ref 0.0–0.1)
Basophils Relative: 0 %
Eosinophils Absolute: 0 10*3/uL (ref 0.0–0.5)
Eosinophils Relative: 0 %
HCT: 30 % — ABNORMAL LOW (ref 39.0–52.0)
Hemoglobin: 9.8 g/dL — ABNORMAL LOW (ref 13.0–17.0)
Immature Granulocytes: 3 %
Lymphocytes Relative: 22 %
Lymphs Abs: 0.6 10*3/uL — ABNORMAL LOW (ref 0.7–4.0)
MCH: 32.7 pg (ref 26.0–34.0)
MCHC: 32.7 g/dL (ref 30.0–36.0)
MCV: 100 fL (ref 80.0–100.0)
Monocytes Absolute: 0.6 10*3/uL (ref 0.1–1.0)
Monocytes Relative: 20 %
Neutro Abs: 1.6 10*3/uL — ABNORMAL LOW (ref 1.7–7.7)
Neutrophils Relative %: 55 %
Platelet Count: 296 10*3/uL (ref 150–400)
RBC: 3 MIL/uL — ABNORMAL LOW (ref 4.22–5.81)
RDW: 16.3 % — ABNORMAL HIGH (ref 11.5–15.5)
WBC Count: 2.9 10*3/uL — ABNORMAL LOW (ref 4.0–10.5)
nRBC: 0 % (ref 0.0–0.2)

## 2020-08-25 LAB — MAGNESIUM: Magnesium: 1.9 mg/dL (ref 1.7–2.4)

## 2020-08-25 MED ORDER — SODIUM CHLORIDE 0.9% FLUSH
10.0000 mL | INTRAVENOUS | Status: DC | PRN
  Administered 2020-08-25: 10 mL
  Filled 2020-08-25: qty 10

## 2020-08-25 MED ORDER — MAGNESIUM SULFATE 2 GM/50ML IV SOLN
2.0000 g | Freq: Once | INTRAVENOUS | Status: AC
  Administered 2020-08-25: 2 g via INTRAVENOUS
  Filled 2020-08-25: qty 50

## 2020-08-25 MED ORDER — SODIUM CHLORIDE 0.9 % IV SOLN
Freq: Once | INTRAVENOUS | Status: AC
  Filled 2020-08-25: qty 250

## 2020-08-25 MED ORDER — SODIUM CHLORIDE 0.9 % IV SOLN
30.0000 mg/m2 | Freq: Once | INTRAVENOUS | Status: AC
  Administered 2020-08-25: 55 mg via INTRAVENOUS
  Filled 2020-08-25: qty 55

## 2020-08-25 MED ORDER — POTASSIUM CHLORIDE IN NACL 20-0.9 MEQ/L-% IV SOLN
Freq: Once | INTRAVENOUS | Status: AC
  Filled 2020-08-25: qty 1000

## 2020-08-25 MED ORDER — SODIUM CHLORIDE 0.9 % IV SOLN
65.0000 mg/m2 | Freq: Once | INTRAVENOUS | Status: AC
  Administered 2020-08-25: 120 mg via INTRAVENOUS
  Filled 2020-08-25: qty 5

## 2020-08-25 MED ORDER — ATROPINE SULFATE 1 MG/ML IJ SOLN
0.5000 mg | Freq: Once | INTRAMUSCULAR | Status: AC | PRN
  Administered 2020-08-25: 0.5 mg via INTRAVENOUS

## 2020-08-25 MED ORDER — PALONOSETRON HCL INJECTION 0.25 MG/5ML
0.2500 mg | Freq: Once | INTRAVENOUS | Status: AC
  Administered 2020-08-25: 0.25 mg via INTRAVENOUS
  Filled 2020-08-25: qty 5

## 2020-08-25 MED ORDER — HEPARIN SOD (PORK) LOCK FLUSH 100 UNIT/ML IV SOLN
500.0000 [IU] | Freq: Once | INTRAVENOUS | Status: AC | PRN
  Administered 2020-08-25: 500 [IU]
  Filled 2020-08-25: qty 5

## 2020-08-25 MED ORDER — SODIUM CHLORIDE 0.9 % IV SOLN
10.0000 mg | Freq: Once | INTRAVENOUS | Status: AC
  Administered 2020-08-25: 10 mg via INTRAVENOUS
  Filled 2020-08-25: qty 10

## 2020-08-25 MED ORDER — SODIUM CHLORIDE 0.9 % IV SOLN
150.0000 mg | Freq: Once | INTRAVENOUS | Status: AC
  Administered 2020-08-25: 150 mg via INTRAVENOUS
  Filled 2020-08-25: qty 150

## 2020-08-25 NOTE — Progress Notes (Signed)
Reviewed pt labs with Dr. Marin Olp and pt ok to treat with Creatinine 1.6  Per Dr. Marin Olp ok to run post hydration fluids with cisplatin infusion.

## 2020-08-25 NOTE — Patient Instructions (Signed)
Implanted Port Home Guide An implanted port is a device that is placed under the skin. It is usually placed in the chest. The device can be used to give IV medicine, to take blood, or for dialysis. You may have an implanted port if: You need IV medicine that would be irritating to the small veins in your hands or arms. You need IV medicines, such as antibiotics, for a long period of time. You need IV nutrition for a long period of time. You need dialysis. When you have a port, your health care provider can choose to use the port instead of veins in your arms for these procedures. You may have fewer limitations when using a port than you would if you used other types of long-term IVs, and you will likely be able to return to normal activities afteryour incision heals. An implanted port has two main parts: Reservoir. The reservoir is the part where a needle is inserted to give medicines or draw blood. The reservoir is round. After it is placed, it appears as a small, raised area under your skin. Catheter. The catheter is a thin, flexible tube that connects the reservoir to a vein. Medicine that is inserted into the reservoir goes into the catheter and then into the vein. How is my port accessed? To access your port: A numbing cream may be placed on the skin over the port site. Your health care provider will put on a mask and sterile gloves. The skin over your port will be cleaned carefully with a germ-killing soap and allowed to dry. Your health care provider will gently pinch the port and insert a needle into it. Your health care provider will check for a blood return to make sure the port is in the vein and is not clogged. If your port needs to remain accessed to get medicine continuously (constant infusion), your health care provider will place a clear bandage (dressing) over the needle site. The dressing and needle will need to be changed every week, or as told by your health care provider. What  is flushing? Flushing helps keep the port from getting clogged. Follow instructions from your health care provider about how and when to flush the port. Ports are usually flushed with saline solution or a medicine called heparin. The need for flushing will depend on how the port is used: If the port is only used from time to time to give medicines or draw blood, the port may need to be flushed: Before and after medicines have been given. Before and after blood has been drawn. As part of routine maintenance. Flushing may be recommended every 4-6 weeks. If a constant infusion is running, the port may not need to be flushed. Throw away any syringes in a disposal container that is meant for sharp items (sharps container). You can buy a sharps container from a pharmacy, or you can make one by using an empty hard plastic bottle with a cover. How long will my port stay implanted? The port can stay in for as long as your health care provider thinks it is needed. When it is time for the port to come out, a surgery will be done to remove it. The surgery will be similar to the procedure that was done to putthe port in. Follow these instructions at home:  Flush your port as told by your health care provider. If you need an infusion over several days, follow instructions from your health care provider about how to take   care of your port site. Make sure you: Wash your hands with soap and water before you change your dressing. If soap and water are not available, use alcohol-based hand sanitizer. Change your dressing as told by your health care provider. Place any used dressings or infusion bags into a plastic bag. Throw that bag in the trash. Keep the dressing that covers the needle clean and dry. Do not get it wet. Do not use scissors or sharp objects near the tube. Keep the tube clamped, unless it is being used. Check your port site every day for signs of infection. Check for: Redness, swelling, or  pain. Fluid or blood. Pus or a bad smell. Protect the skin around the port site. Avoid wearing bra straps that rub or irritate the site. Protect the skin around your port from seat belts. Place a soft pad over your chest if needed. Bathe or shower as told by your health care provider. The site may get wet as long as you are not actively receiving an infusion. Return to your normal activities as told by your health care provider. Ask your health care provider what activities are safe for you. Carry a medical alert card or wear a medical alert bracelet at all times. This will let health care providers know that you have an implanted port in case of an emergency. Get help right away if: You have redness, swelling, or pain at the port site. You have fluid or blood coming from your port site. You have pus or a bad smell coming from the port site. You have a fever. Summary Implanted ports are usually placed in the chest for long-term IV access. Follow instructions from your health care provider about flushing the port and changing bandages (dressings). Take care of the area around your port by avoiding clothing that puts pressure on the area, and by watching for signs of infection. Protect the skin around your port from seat belts. Place a soft pad over your chest if needed. Get help right away if you have a fever or you have redness, swelling, pain, drainage, or a bad smell at the port site. This information is not intended to replace advice given to you by your health care provider. Make sure you discuss any questions you have with your healthcare provider. Document Revised: 05/18/2019 Document Reviewed: 05/18/2019 Elsevier Patient Education  2022 [employer] Inc.  

## 2020-08-25 NOTE — Patient Instructions (Addendum)
[facility] [place]  Discharge Instructions: Thank you for choosing [facility] to provide your oncology and hematology care.   If you have a lab appointment with the [facility], please go directly to the [facility] and check in at the registration area.  Wear comfortable clothing and clothing appropriate for easy access to any Portacath or PICC line.   We strive to give you quality time with your provider. You may need to reschedule your appointment if you arrive late (15 or more minutes).  Arriving late affects you and other patients whose appointments are after yours.  Also, if you miss three or more appointments without notifying the office, you may be dismissed from the clinic at the provider's discretion.      For prescription refill requests, have your pharmacy contact our office and allow 72 hours for refills to be completed.    Today you received the following chemotherapy and/or immunotherapy agents Cisplatin, Irinotecan   To help prevent nausea and vomiting after your treatment, we encourage you to take your nausea medication as directed.  BELOW ARE SYMPTOMS THAT SHOULD BE REPORTED IMMEDIATELY: *FEVER GREATER THAN 100.4 F (38 C) OR HIGHER *CHILLS OR SWEATING *NAUSEA AND VOMITING THAT IS NOT CONTROLLED WITH YOUR NAUSEA MEDICATION *UNUSUAL SHORTNESS OF BREATH *UNUSUAL BRUISING OR BLEEDING *URINARY PROBLEMS (pain or burning when urinating, or frequent urination) *BOWEL PROBLEMS (unusual diarrhea, constipation, pain near the anus) TENDERNESS IN MOUTH AND THROAT WITH OR WITHOUT PRESENCE OF ULCERS (sore throat, sores in mouth, or a toothache) UNUSUAL RASH, SWELLING OR PAIN  UNUSUAL VAGINAL DISCHARGE OR ITCHING   Items with * indicate a potential emergency and should be followed up as soon as possible or go to the Emergency Department if any problems should occur.  Please show the CHEMOTHERAPY ALERT CARD or IMMUNOTHERAPY ALERT CARD at check-in  to the Emergency Department and triage nurse. Should you have questions after your visit or need to cancel or reschedule your appointment, please contact [facility]  [phone] and follow the prompts.  Office hours are 8:00 a.m. to 4:30 p.m. Monday - Friday. Please note that voicemails left after 4:00 p.m. may not be returned until the following business day.  We are closed weekends and major holidays. You have access to a nurse at all times for urgent questions. Please call the main number to the clinic [phone] and follow the prompts.  For any non-urgent questions, you may also contact your provider using MyChart. We now offer e-Visits for anyone 19 and older to request care online for non-urgent symptoms. For details visit [email or web address].   Also download the MyChart app! Go to the app store, search "MyChart", open the app, select [facility], and log in with your MyChart username and password.  Due to Covid, a mask is required upon entering the hospital/clinic. If you do not have a mask, one will be given to you upon arrival. For doctor visits, patients may have 1 support person aged 36 or older with them. For treatment visits, patients cannot have anyone with them due to current Covid guidelines and our immunocompromised population.

## 2020-08-26 DIAGNOSIS — M9904 Segmental and somatic dysfunction of sacral region: Secondary | ICD-10-CM | POA: Diagnosis not present

## 2020-08-26 DIAGNOSIS — M9901 Segmental and somatic dysfunction of cervical region: Secondary | ICD-10-CM | POA: Diagnosis not present

## 2020-08-26 DIAGNOSIS — M9903 Segmental and somatic dysfunction of lumbar region: Secondary | ICD-10-CM | POA: Diagnosis not present

## 2020-08-26 DIAGNOSIS — M9902 Segmental and somatic dysfunction of thoracic region: Secondary | ICD-10-CM | POA: Diagnosis not present

## 2020-09-05 ENCOUNTER — Other Ambulatory Visit (HOSPITAL_BASED_OUTPATIENT_CLINIC_OR_DEPARTMENT_OTHER): Payer: Self-pay

## 2020-09-05 MED FILL — Pantoprazole Sodium EC Tab 40 MG (Base Equiv): ORAL | 30 days supply | Qty: 30 | Fill #4 | Status: AC

## 2020-09-05 MED FILL — Ezetimibe Tab 10 MG: ORAL | 90 days supply | Qty: 90 | Fill #1 | Status: AC

## 2020-09-07 ENCOUNTER — Ambulatory Visit (HOSPITAL_COMMUNITY): Admission: RE | Admit: 2020-09-07 | Payer: Medicare Other | Source: Ambulatory Visit

## 2020-09-07 ENCOUNTER — Ambulatory Visit (HOSPITAL_COMMUNITY)
Admission: RE | Admit: 2020-09-07 | Discharge: 2020-09-07 | Disposition: A | Discharge status: Home / Self Care | Payer: Medicare Other | Source: Ambulatory Visit | Attending: Hematology & Oncology | Admitting: Hematology & Oncology

## 2020-09-07 ENCOUNTER — Other Ambulatory Visit: Payer: Self-pay

## 2020-09-07 DIAGNOSIS — C7A8 Other malignant neuroendocrine tumors: Secondary | ICD-10-CM | POA: Diagnosis not present

## 2020-09-07 DIAGNOSIS — R918 Other nonspecific abnormal finding of lung field: Secondary | ICD-10-CM | POA: Diagnosis not present

## 2020-09-07 DIAGNOSIS — C269 Malignant neoplasm of ill-defined sites within the digestive system: Secondary | ICD-10-CM | POA: Diagnosis not present

## 2020-09-07 MED ORDER — GALLIUM GA 68 DOTATATE IV KIT
4.2000 | PACK | Freq: Once | INTRAVENOUS | Status: AC | PRN
  Administered 2020-09-07: 4.2 via INTRAVENOUS

## 2020-09-08 ENCOUNTER — Telehealth: Payer: Self-pay

## 2020-09-08 NOTE — Telephone Encounter (Signed)
-----   Message from [name], MD sent at 09/08/2020  2:12 PM EDT ----- Call - yeah!!  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  [name]

## 2020-09-08 NOTE — Telephone Encounter (Signed)
Called and informed patient of lab results, patient verbalized understanding and denies any questions or concerns at this time.   

## 2020-09-20 ENCOUNTER — Other Ambulatory Visit: Payer: Self-pay

## 2020-09-20 ENCOUNTER — Inpatient Hospital Stay: Payer: Medicare Other

## 2020-09-20 ENCOUNTER — Telehealth: Payer: Self-pay

## 2020-09-20 ENCOUNTER — Inpatient Hospital Stay: Payer: Medicare Other | Attending: Hematology & Oncology

## 2020-09-20 ENCOUNTER — Inpatient Hospital Stay (HOSPITAL_BASED_OUTPATIENT_CLINIC_OR_DEPARTMENT_OTHER): Payer: Medicare Other | Admitting: Hematology & Oncology

## 2020-09-20 ENCOUNTER — Other Ambulatory Visit (HOSPITAL_BASED_OUTPATIENT_CLINIC_OR_DEPARTMENT_OTHER): Payer: Self-pay

## 2020-09-20 ENCOUNTER — Encounter: Payer: Self-pay | Admitting: Hematology & Oncology

## 2020-09-20 VITALS — BP 149/82 | HR 61 | Temp 98.2°F | Resp 18 | Wt 164.0 lb

## 2020-09-20 DIAGNOSIS — C7A8 Other malignant neuroendocrine tumors: Secondary | ICD-10-CM | POA: Diagnosis not present

## 2020-09-20 DIAGNOSIS — R197 Diarrhea, unspecified: Secondary | ICD-10-CM | POA: Insufficient documentation

## 2020-09-20 DIAGNOSIS — K5900 Constipation, unspecified: Secondary | ICD-10-CM | POA: Insufficient documentation

## 2020-09-20 DIAGNOSIS — M2550 Pain in unspecified joint: Secondary | ICD-10-CM | POA: Diagnosis not present

## 2020-09-20 DIAGNOSIS — C61 Malignant neoplasm of prostate: Secondary | ICD-10-CM | POA: Insufficient documentation

## 2020-09-20 DIAGNOSIS — K909 Intestinal malabsorption, unspecified: Secondary | ICD-10-CM | POA: Insufficient documentation

## 2020-09-20 DIAGNOSIS — Z5111 Encounter for antineoplastic chemotherapy: Secondary | ICD-10-CM | POA: Diagnosis not present

## 2020-09-20 DIAGNOSIS — Z79899 Other long term (current) drug therapy: Secondary | ICD-10-CM | POA: Insufficient documentation

## 2020-09-20 DIAGNOSIS — R531 Weakness: Secondary | ICD-10-CM | POA: Insufficient documentation

## 2020-09-20 DIAGNOSIS — C7951 Secondary malignant neoplasm of bone: Secondary | ICD-10-CM

## 2020-09-20 DIAGNOSIS — D509 Iron deficiency anemia, unspecified: Secondary | ICD-10-CM | POA: Insufficient documentation

## 2020-09-20 LAB — CMP (CANCER CENTER ONLY)
ALT: 19 U/L (ref 0–44)
AST: 23 U/L (ref 15–41)
Albumin: 4 g/dL (ref 3.5–5.0)
Alkaline Phosphatase: 67 U/L (ref 38–126)
Anion gap: 8 (ref 5–15)
BUN: 24 mg/dL — ABNORMAL HIGH (ref 8–23)
CO2: 24 mmol/L (ref 22–32)
Calcium: 9.3 mg/dL (ref 8.9–10.3)
Chloride: 103 mmol/L (ref 98–111)
Creatinine: 1.55 mg/dL — ABNORMAL HIGH (ref 0.61–1.24)
GFR, Estimated: 47 mL/min — ABNORMAL LOW (ref >60)
Glucose, Bld: 158 mg/dL — ABNORMAL HIGH (ref 70–99)
Potassium: 4.1 mmol/L (ref 3.5–5.1)
Sodium: 135 mmol/L (ref 135–145)
Total Bilirubin: 0.2 mg/dL — ABNORMAL LOW (ref 0.3–1.2)
Total Protein: 6.8 g/dL (ref 6.5–8.1)

## 2020-09-20 LAB — CBC WITH DIFFERENTIAL (CANCER CENTER ONLY)
Abs Immature Granulocytes: 0.09 10*3/uL — ABNORMAL HIGH (ref 0.00–0.07)
Basophils Absolute: 0 10*3/uL (ref 0.0–0.1)
Basophils Relative: 0 %
Eosinophils Absolute: 0.1 10*3/uL (ref 0.0–0.5)
Eosinophils Relative: 2 %
HCT: 30.9 % — ABNORMAL LOW (ref 39.0–52.0)
Hemoglobin: 9.9 g/dL — ABNORMAL LOW (ref 13.0–17.0)
Immature Granulocytes: 2 %
Lymphocytes Relative: 20 %
Lymphs Abs: 1 10*3/uL (ref 0.7–4.0)
MCH: 33 pg (ref 26.0–34.0)
MCHC: 32 g/dL (ref 30.0–36.0)
MCV: 103 fL — ABNORMAL HIGH (ref 80.0–100.0)
Monocytes Absolute: 0.9 10*3/uL (ref 0.1–1.0)
Monocytes Relative: 18 %
Neutro Abs: 3 10*3/uL (ref 1.7–7.7)
Neutrophils Relative %: 58 %
Platelet Count: 310 10*3/uL (ref 150–400)
RBC: 3 MIL/uL — ABNORMAL LOW (ref 4.22–5.81)
RDW: 17.7 % — ABNORMAL HIGH (ref 11.5–15.5)
WBC Count: 5.1 10*3/uL (ref 4.0–10.5)
nRBC: 0 % (ref 0.0–0.2)

## 2020-09-20 LAB — RETICULOCYTES
Immature Retic Fract: 29.7 % — ABNORMAL HIGH (ref 2.3–15.9)
RBC.: 2.95 MIL/uL — ABNORMAL LOW (ref 4.22–5.81)
Retic Count, Absolute: 75.5 10*3/uL (ref 19.0–186.0)
Retic Ct Pct: 2.6 % (ref 0.4–3.1)

## 2020-09-20 LAB — SAMPLE TO BLOOD BANK

## 2020-09-20 LAB — IRON AND TIBC
Iron: 100 ug/dL (ref 42–163)
Saturation Ratios: 38 % (ref 20–55)
TIBC: 266 ug/dL (ref 202–409)
UIBC: 166 ug/dL (ref 117–376)

## 2020-09-20 LAB — LACTATE DEHYDROGENASE: LDH: 198 U/L — ABNORMAL HIGH (ref 98–192)

## 2020-09-20 LAB — FERRITIN: Ferritin: 570 ng/mL — ABNORMAL HIGH (ref 24–336)

## 2020-09-20 LAB — MAGNESIUM: Magnesium: 2.1 mg/dL (ref 1.7–2.4)

## 2020-09-20 MED ORDER — SODIUM CHLORIDE 0.9 % IV SOLN
150.0000 mg | Freq: Once | INTRAVENOUS | Status: AC
  Administered 2020-09-20: 150 mg via INTRAVENOUS
  Filled 2020-09-20: qty 150

## 2020-09-20 MED ORDER — SODIUM CHLORIDE 0.9 % IV SOLN: Freq: Once | INTRAVENOUS | Status: AC

## 2020-09-20 MED ORDER — SODIUM CHLORIDE 0.9 % IV SOLN
10.0000 mg | Freq: Once | INTRAVENOUS | Status: AC
  Administered 2020-09-20: 10 mg via INTRAVENOUS
  Filled 2020-09-20: qty 10

## 2020-09-20 MED ORDER — HEPARIN SOD (PORK) LOCK FLUSH 100 UNIT/ML IV SOLN
500.0000 [IU] | Freq: Once | INTRAVENOUS | Status: AC | PRN
  Administered 2020-09-20: 500 [IU]

## 2020-09-20 MED ORDER — ATROPINE SULFATE 1 MG/ML IJ SOLN
0.5000 mg | Freq: Once | INTRAMUSCULAR | Status: AC | PRN
  Administered 2020-09-20: 0.5 mg via INTRAVENOUS
  Filled 2020-09-20: qty 1

## 2020-09-20 MED ORDER — SODIUM CHLORIDE 0.9 % IV SOLN
65.0000 mg/m2 | Freq: Once | INTRAVENOUS | Status: AC
  Administered 2020-09-20: 120 mg via INTRAVENOUS
  Filled 2020-09-20: qty 6

## 2020-09-20 MED ORDER — POTASSIUM CHLORIDE IN NACL 20-0.9 MEQ/L-% IV SOLN
Freq: Once | INTRAVENOUS | Status: AC
  Filled 2020-09-20: qty 1000

## 2020-09-20 MED ORDER — SODIUM CHLORIDE 0.9 % IV SOLN
30.0000 mg/m2 | Freq: Once | INTRAVENOUS | Status: AC
  Administered 2020-09-20: 55 mg via INTRAVENOUS
  Filled 2020-09-20: qty 55

## 2020-09-20 MED ORDER — MAGNESIUM SULFATE 2 GM/50ML IV SOLN
2.0000 g | Freq: Once | INTRAVENOUS | Status: AC
  Administered 2020-09-20: 2 g via INTRAVENOUS
  Filled 2020-09-20: qty 50

## 2020-09-20 MED ORDER — SODIUM CHLORIDE 0.9% FLUSH
10.0000 mL | INTRAVENOUS | Status: DC | PRN
  Administered 2020-09-20: 10 mL

## 2020-09-20 MED ORDER — PALONOSETRON HCL INJECTION 0.25 MG/5ML
0.2500 mg | Freq: Once | INTRAVENOUS | Status: AC
  Administered 2020-09-20: 0.25 mg via INTRAVENOUS
  Filled 2020-09-20: qty 5

## 2020-09-20 NOTE — Patient Instructions (Signed)
Implanted Port Home Guide An implanted port is a device that is placed under the skin. It is usually placed in the chest. The device can be used to give IV medicine, to take blood, or for dialysis. You may have an implanted port if: You need IV medicine that would be irritating to the small veins in your hands or arms. You need IV medicines, such as antibiotics, for a long period of time. You need IV nutrition for a long period of time. You need dialysis. When you have a port, your health care provider can choose to use the port instead of veins in your arms for these procedures. You may have fewer limitations when using a port than you would if you used other types of long-term IVs, and you will likely be able to return to normal activities after your incision heals. An implanted port has two main parts: Reservoir. The reservoir is the part where a needle is inserted to give medicines or draw blood. The reservoir is round. After it is placed, it appears as a small, raised area under your skin. Catheter. The catheter is a thin, flexible tube that connects the reservoir to a vein. Medicine that is inserted into the reservoir goes into the catheter and then into the vein. How is my port accessed? To access your port: A numbing cream may be placed on the skin over the port site. Your health care provider will put on a mask and sterile gloves. The skin over your port will be cleaned carefully with a germ-killing soap and allowed to dry. Your health care provider will gently pinch the port and insert a needle into it. Your health care provider will check for a blood return to make sure the port is in the vein and is not clogged. If your port needs to remain accessed to get medicine continuously (constant infusion), your health care provider will place a clear bandage (dressing) over the needle site. The dressing and needle will need to be changed every week, or as told by your health care provider. What  is flushing? Flushing helps keep the port from getting clogged. Follow instructions from your health care provider about how and when to flush the port. Ports are usually flushed with saline solution or a medicine called heparin. The need for flushing will depend on how the port is used: If the port is only used from time to time to give medicines or draw blood, the port may need to be flushed: Before and after medicines have been given. Before and after blood has been drawn. As part of routine maintenance. Flushing may be recommended every 4-6 weeks. If a constant infusion is running, the port may not need to be flushed. Throw away any syringes in a disposal container that is meant for sharp items (sharps container). You can buy a sharps container from a pharmacy, or you can make one by using an empty hard plastic bottle with a cover. How long will my port stay implanted? The port can stay in for as long as your health care provider thinks it is needed. When it is time for the port to come out, a surgery will be done to remove it. The surgery will be similar to the procedure that was done to put the port in. Follow these instructions at home:  Flush your port as told by your health care provider. If you need an infusion over several days, follow instructions from your health care provider about how   to take care of your port site. Make sure you: Wash your hands with soap and water before you change your dressing. If soap and water are not available, use alcohol-based hand sanitizer. Change your dressing as told by your health care provider. Place any used dressings or infusion bags into a plastic bag. Throw that bag in the trash. Keep the dressing that covers the needle clean and dry. Do not get it wet. Do not use scissors or sharp objects near the tube. Keep the tube clamped, unless it is being used. Check your port site every day for signs of infection. Check for: Redness, swelling, or  pain. Fluid or blood. Pus or a bad smell. Protect the skin around the port site. Avoid wearing bra straps that rub or irritate the site. Protect the skin around your port from seat belts. Place a soft pad over your chest if needed. Bathe or shower as told by your health care provider. The site may get wet as long as you are not actively receiving an infusion. Return to your normal activities as told by your health care provider. Ask your health care provider what activities are safe for you. Carry a medical alert card or wear a medical alert bracelet at all times. This will let health care providers know that you have an implanted port in case of an emergency. Get help right away if: You have redness, swelling, or pain at the port site. You have fluid or blood coming from your port site. You have pus or a bad smell coming from the port site. You have a fever. Summary Implanted ports are usually placed in the chest for long-term IV access. Follow instructions from your health care provider about flushing the port and changing bandages (dressings). Take care of the area around your port by avoiding clothing that puts pressure on the area, and by watching for signs of infection. Protect the skin around your port from seat belts. Place a soft pad over your chest if needed. Get help right away if you have a fever or you have redness, swelling, pain, drainage, or a bad smell at the port site. This information is not intended to replace advice given to you by your health care provider. Make sure you discuss any questions you have with your health care provider. Document Revised: 03/23/2020 Document Reviewed: 05/18/2019 Elsevier Patient Education  2022 Elsevier Inc.  

## 2020-09-20 NOTE — Patient Instructions (Signed)
[facility]  Discharge Instructions: Thank you for choosing [facility] to provide your oncology and hematology care.   If you have a lab appointment with the [facility], please go directly to the [facility] and check in at the registration area.  Wear comfortable clothing and clothing appropriate for easy access to any Portacath or PICC line.   We strive to give you quality time with your provider. You may need to reschedule your appointment if you arrive late (15 or more minutes).  Arriving late affects you and other patients whose appointments are after yours.  Also, if you miss three or more appointments without notifying the office, you may be dismissed from the clinic at the provider's discretion.      For prescription refill requests, have your pharmacy contact our office and allow 72 hours for refills to be completed.    Today you received the following chemotherapy and/or immunotherapy agents Cisplatin, Irinotecan   To help prevent nausea and vomiting after your treatment, we encourage you to take your nausea medication as directed.  BELOW ARE SYMPTOMS THAT SHOULD BE REPORTED IMMEDIATELY: *FEVER GREATER THAN 100.4 F (38 C) OR HIGHER *CHILLS OR SWEATING *NAUSEA AND VOMITING THAT IS NOT CONTROLLED WITH YOUR NAUSEA MEDICATION *UNUSUAL SHORTNESS OF BREATH *UNUSUAL BRUISING OR BLEEDING *URINARY PROBLEMS (pain or burning when urinating, or frequent urination) *BOWEL PROBLEMS (unusual diarrhea, constipation, pain near the anus) TENDERNESS IN MOUTH AND THROAT WITH OR WITHOUT PRESENCE OF ULCERS (sore throat, sores in mouth, or a toothache) UNUSUAL RASH, SWELLING OR PAIN  UNUSUAL VAGINAL DISCHARGE OR ITCHING   Items with * indicate a potential emergency and should be followed up as soon as possible or go to the Emergency Department if any problems should occur.  Please show the CHEMOTHERAPY ALERT CARD or IMMUNOTHERAPY ALERT CARD at check-in  to the Emergency Department and triage nurse. Should you have questions after your visit or need to cancel or reschedule your appointment, please contact [facility]  [phone] and follow the prompts.  Office hours are 8:00 a.m. to 4:30 p.m. Monday - Friday. Please note that voicemails left after 4:00 p.m. may not be returned until the following business day.  We are closed weekends and major holidays. You have access to a nurse at all times for urgent questions. Please call the main number to the clinic [phone] and follow the prompts.  For any non-urgent questions, you may also contact your provider using MyChart. We now offer e-Visits for anyone 66 and older to request care online for non-urgent symptoms. For details visit [email or web address].   Also download the MyChart app! Go to the app store, search "MyChart", open the app, select [facility], and log in with your MyChart username and password.  Due to Covid, a mask is required upon entering the hospital/clinic. If you do not have a mask, one will be given to you upon arrival. For doctor visits, patients may have 1 support person aged 22 or older with them. For treatment visits, patients cannot have anyone with them due to current Covid guidelines and our immunocompromised population.

## 2020-09-20 NOTE — Progress Notes (Signed)
Hematology and Oncology Follow Up Visit  [name] [id number] 28-Sep-1945 75 y.o. 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 Iron def anemia -- malabsorption   Past Therapy: Zytiga 1000 mg by mouth daily - discontinued on 08/15/2016 Xtandi 160 mg po q day - start on 08/15/2016 - discontinued Radium-223 therapy -- s/p cycle #6 Palliative radiation to the right sacrum Lutathera - s/p cycle 4/4 --last dose on 07/11/2017 Carbo/VP-16/Tecentriq --  S/p cycle #5 Keytruda q 6 week -- Maintanence -- s/p cycle #3 - changed from 3 to 6 week on 06/30/2018 Taxotere/Keytruda -- start on 09/17/2018 -- s/p cycle #10 -- d/c due to progression   Current Therapy:        Lurbinectedin -- started on 04/29/2019, s/p cycle 17 -- d/c on 05/2020 due to progression CDDP/Irinotecan -- s/p cycle #3 -- start on 06/15/2020 Xgeva 120 mg subcutaneous Q3 months - due in 10/2020 Lupron 22 mg IM every 3 months - due in 10/2020  SBRT to sacral lesion  IV iron as indicated    Interim History:  [name] is here today for follow-up.  We did do a PET scan on him.  The PET scan showed he had a very nice response to treatment.  I am extremely happy about this.  The PET scan was done on 09/07/2020.  He had a very nice decrease in lung nodules.  There was no evidence of any new areas of activity.  There was a sclerotic lesion in the right sacral alla that was not active.  Surprised enough, he and his wife are moving again.  They are removing them to a new townhouse.  I think this is going to happen sometime in October or early November.  I do want to go to [place].  I want to go to [place] in October.  We can certainly accommodate this.  I am just happy that his quality life is doing well.  He is tolerating treatment pretty well.  Has little bit of constipation initially and then has some diarrhea.  He has had no  problems with bleeding.  He does have some urinary frequency.  There is no hematuria.  He has had no cough.  There is no chest wall pain.  He has had no fever.  He does have the brace on his right leg.  He is getting around fairly well.  His appetite has been quite good.  He did have a very nice Labor Day weekend.  Overall, I would say his performance status is probably ECOG 1.    Medications:  Allergies as of 09/20/2020       Reactions   Codeine Nausea And Vomiting   Hydrocodone Nausea And Vomiting        Medication List        Accurate as of September 20, 2020  8:33 AM. If you have any questions, ask your nurse or doctor.          acetaminophen 500 MG tablet Commonly known as: TYLENOL Take 1 tablet (500 mg total) by mouth every 4 (four) hours as needed for mild pain, headache or fever (fever >/= 101).   Anti-Diarrheal 2 MG tablet Generic drug: loperamide Take 2 tablets by mouth at diarrhea onset, then 1 tablet every 2 hours until 12 hours with no bowel movment. May take 2 tablets every 4 hours at night. If diarrhea recurs repeat.   ascorbic acid  500 MG tablet Commonly known as: VITAMIN C Take 1 tablet (500 mg total) by mouth daily.   aspirin 81 MG tablet Take 81 mg by mouth daily.   dexamethasone 4 MG tablet Commonly known as: DECADRON Take 4 mg by mouth daily. X 3 days   dexamethasone 4 MG tablet Commonly known as: DECADRON Take 2 tablets (8 mg total) by mouth daily for 3 days starting the day after cisplatin chemotherapy. Take with food.   ezetimibe 10 MG tablet Commonly known as: ZETIA TAKE 1 TABLET BY MOUTH DAILY What changed: how much to take   gabapentin 300 MG capsule Commonly known as: NEURONTIN TAKE 1 CAPSULE BY MOUTH TWICE A DAY THEN TAKE 2 CAPSULES AT BEDTIME   Generlac 10 GM/15ML Soln Generic drug: lactulose (encephalopathy) Take 15 mLs (10 g total) by mouth daily as needed for constipation.   isosorbide mononitrate 30 MG 24 hr  tablet Commonly known as: IMDUR TAKE 1 TABLET (30 MG TOTAL) BY MOUTH DAILY. What changed:  how much to take when to take this   lidocaine-prilocaine cream Commonly known as: EMLA Apply to affected area once   LORazepam 0.5 MG tablet Commonly known as: Ativan Take 1 tablet (0.5 mg total) by mouth every 6 (six) hours as needed (Nausea or vomiting).   ondansetron 8 MG tablet Commonly known as: Zofran Take 1 tablet (8 mg total) by mouth 2 (two) times daily as needed. Start on the third day after cisplatin chemotherapy.   OVER THE COUNTER MEDICATION every morning. Juice Plus -- 8 capsule of each the garden, vineyard, and orchard twice a day.   pantoprazole 40 MG tablet Commonly known as: PROTONIX TAKE 1 TABLET (40 MG TOTAL) BY MOUTH AT BEDTIME.   predniSONE 10 MG tablet Commonly known as: DELTASONE Take 5 tablets by mouth on day 1 then, 4 tablets on day 2, then 3 tabs on day 3, then 2 tabs on day 4, then 1 tab on day 5, then STOP.   prochlorperazine 10 MG tablet Commonly known as: COMPAZINE Take 1 tablet (10 mg total) by mouth every 6 (six) hours as needed for nausea or vomiting.   prochlorperazine 10 MG tablet Commonly known as: COMPAZINE Take 1 tablet (10 mg total) by mouth every 6 (six) hours as needed for nausea or vomiting.   Zinc 50 MG Caps Take 50 mg by mouth daily.        Allergies:  Allergies  Allergen Reactions   Codeine Nausea And Vomiting   Hydrocodone Nausea And Vomiting    Past Medical History, Surgical history, Social history, and Family History were reviewed and updated.  Review of Systems: Review of Systems  Constitutional: Negative.   HENT: Negative.    Eyes: Negative.   Respiratory: Negative.    Cardiovascular: Negative.   Gastrointestinal: Negative.   Genitourinary: Negative.   Musculoskeletal:  Positive for joint pain.  Skin: Negative.   Neurological:  Positive for focal weakness.  Endo/Heme/Allergies: Negative.    Psychiatric/Behavioral: Negative.      Physical Exam:  weight is 164 lb (74.4 kg). His oral temperature is 98.2 F (36.8 C). His blood pressure is 149/82 (abnormal) and his pulse is 61. His respiration is 18 and oxygen saturation is 100%.   Wt Readings from Last 3 Encounters:  09/20/20 164 lb (74.4 kg)  09/07/20 155 lb (70.3 kg)  08/16/20 163 lb (73.9 kg)    Physical Exam Vitals reviewed.  HENT:     Head: Normocephalic and atraumatic.  Eyes:     Pupils: Pupils are equal, round, and reactive to light.  Cardiovascular:     Rate and Rhythm: Normal rate and regular rhythm.     Heart sounds: Normal heart sounds.  Pulmonary:     Effort: Pulmonary effort is normal.     Breath sounds: Normal breath sounds.  Abdominal:     General: Bowel sounds are normal.     Palpations: Abdomen is soft.  Musculoskeletal:        General: No tenderness or deformity. Normal range of motion.     Cervical back: Normal range of motion.  Lymphadenopathy:     Cervical: No cervical adenopathy.  Skin:    General: Skin is warm and dry.     Findings: No erythema or rash.  Neurological:     Mental Status: He is alert and oriented to person, place, and time.  Psychiatric:        Behavior: Behavior normal.        Thought Content: Thought content normal.        Judgment: Judgment normal.   Lab Results  Component Value Date   WBC 2.9 (L) 08/25/2020   HGB 9.8 (L) 08/25/2020   HCT 30.0 (L) 08/25/2020   MCV 100.0 08/25/2020   PLT 296 08/25/2020   Lab Results  Component Value Date   FERRITIN 471 (H) 07/11/2020   IRON 65 07/11/2020   TIBC 221 07/11/2020   UIBC 157 07/11/2020   IRONPCTSAT 29 07/11/2020   Lab Results  Component Value Date   RETICCTPCT 1.4 08/16/2020   RBC 3.00 (L) 08/25/2020   No results found for: KPAFRELGTCHN, LAMBDASER, KAPLAMBRATIO No results found for: IGGSERUM, IGA, IGMSERUM No results found for: Odetta Pink, SPEI    Chemistry      Component Value Date/Time   NA 136 08/25/2020 0805   NA 144 12/14/2016 1159   NA 140 10/22/2016 1129   K 4.1 08/25/2020 0805   K 3.5 12/14/2016 1159   K 4.3 10/22/2016 1129   CL 101 08/25/2020 0805   CL 103 12/14/2016 1159   CO2 25 08/25/2020 0805   CO2 30 12/14/2016 1159   CO2 27 10/22/2016 1129   BUN 23 08/25/2020 0805   BUN 16 12/14/2016 1159   BUN 14.0 10/22/2016 1129   CREATININE 1.60 (H) 08/25/2020 0805   CREATININE 1.3 (H) 12/14/2016 1159   CREATININE 1.1 10/22/2016 1129      Component Value Date/Time   CALCIUM 9.3 08/25/2020 0805   CALCIUM 10.0 12/14/2016 1159   CALCIUM 10.4 10/22/2016 1129   ALKPHOS 56 08/25/2020 0805   ALKPHOS 48 12/14/2016 1159   ALKPHOS 60 10/22/2016 1129   AST 14 (L) 08/25/2020 0805   AST 16 10/22/2016 1129   ALT 15 08/25/2020 0805   ALT 23 12/14/2016 1159   ALT 14 10/22/2016 1129   BILITOT 0.2 (L) 08/25/2020 0805   BILITOT 0.31 10/22/2016 1129       Impression and Plan: [name] is 75 yo caucasian gentleman with metastatic neuroendocrine carcinoma of unknown primary.    I am just happy that the PET scan shows he is responding well.  He does seem to be responding.  He is tolerating treatment well.  His quality of life is good.  I am glad that they will be going to [place].  They will be gonethe second week in October.  We will proceed with his fourth cycle of treatment.  I will  then plan to get him back in 1 month.  I want to give an extra week off between cycles.  I think he needs the extra week off.  Again, his quality of life is what it he prioritizes.  I totally agree with this.    9/6/20228:33 AM

## 2020-09-20 NOTE — Progress Notes (Signed)
Okay to treat today with SCr 1.55 per Dr. Marin Olp.

## 2020-09-20 NOTE — Telephone Encounter (Signed)
Appts made per 09/20/20 los, pt gain updated sch at chk out and through [employer]

## 2020-09-21 LAB — CHROMOGRANIN A: Chromogranin A (ng/mL): 1145 ng/mL — ABNORMAL HIGH (ref 0.0–101.8)

## 2020-09-27 ENCOUNTER — Other Ambulatory Visit: Payer: Self-pay

## 2020-09-27 DIAGNOSIS — C7A8 Other malignant neuroendocrine tumors: Secondary | ICD-10-CM

## 2020-09-28 ENCOUNTER — Inpatient Hospital Stay: Payer: Medicare Other

## 2020-09-28 ENCOUNTER — Other Ambulatory Visit: Payer: Self-pay

## 2020-09-28 ENCOUNTER — Other Ambulatory Visit (HOSPITAL_BASED_OUTPATIENT_CLINIC_OR_DEPARTMENT_OTHER): Payer: Self-pay

## 2020-09-28 DIAGNOSIS — K909 Intestinal malabsorption, unspecified: Secondary | ICD-10-CM | POA: Diagnosis not present

## 2020-09-28 DIAGNOSIS — C61 Malignant neoplasm of prostate: Secondary | ICD-10-CM | POA: Diagnosis not present

## 2020-09-28 DIAGNOSIS — C7A8 Other malignant neuroendocrine tumors: Secondary | ICD-10-CM

## 2020-09-28 DIAGNOSIS — R197 Diarrhea, unspecified: Secondary | ICD-10-CM | POA: Diagnosis not present

## 2020-09-28 DIAGNOSIS — C7951 Secondary malignant neoplasm of bone: Secondary | ICD-10-CM

## 2020-09-28 DIAGNOSIS — K5900 Constipation, unspecified: Secondary | ICD-10-CM | POA: Diagnosis not present

## 2020-09-28 DIAGNOSIS — D509 Iron deficiency anemia, unspecified: Secondary | ICD-10-CM | POA: Diagnosis not present

## 2020-09-28 DIAGNOSIS — Z5111 Encounter for antineoplastic chemotherapy: Secondary | ICD-10-CM | POA: Diagnosis not present

## 2020-09-28 LAB — CBC WITH DIFFERENTIAL (CANCER CENTER ONLY)
Abs Immature Granulocytes: 0.04 10*3/uL (ref 0.00–0.07)
Basophils Absolute: 0 10*3/uL (ref 0.0–0.1)
Basophils Relative: 0 %
Eosinophils Absolute: 0.1 10*3/uL (ref 0.0–0.5)
Eosinophils Relative: 1 %
HCT: 28.4 % — ABNORMAL LOW (ref 39.0–52.0)
Hemoglobin: 9.3 g/dL — ABNORMAL LOW (ref 13.0–17.0)
Immature Granulocytes: 1 %
Lymphocytes Relative: 19 %
Lymphs Abs: 0.8 10*3/uL (ref 0.7–4.0)
MCH: 33.5 pg (ref 26.0–34.0)
MCHC: 32.7 g/dL (ref 30.0–36.0)
MCV: 102.2 fL — ABNORMAL HIGH (ref 80.0–100.0)
Monocytes Absolute: 0.6 10*3/uL (ref 0.1–1.0)
Monocytes Relative: 13 %
Neutro Abs: 2.8 10*3/uL (ref 1.7–7.7)
Neutrophils Relative %: 66 %
Platelet Count: 283 10*3/uL (ref 150–400)
RBC: 2.78 MIL/uL — ABNORMAL LOW (ref 4.22–5.81)
RDW: 17.2 % — ABNORMAL HIGH (ref 11.5–15.5)
WBC Count: 4.2 10*3/uL (ref 4.0–10.5)
nRBC: 0 % (ref 0.0–0.2)

## 2020-09-28 LAB — CMP (CANCER CENTER ONLY)
ALT: 17 U/L (ref 0–44)
AST: 14 U/L — ABNORMAL LOW (ref 15–41)
Albumin: 4.1 g/dL (ref 3.5–5.0)
Alkaline Phosphatase: 56 U/L (ref 38–126)
Anion gap: 12 (ref 5–15)
BUN: 26 mg/dL — ABNORMAL HIGH (ref 8–23)
CO2: 23 mmol/L (ref 22–32)
Calcium: 9.4 mg/dL (ref 8.9–10.3)
Chloride: 101 mmol/L (ref 98–111)
Creatinine: 1.56 mg/dL — ABNORMAL HIGH (ref 0.61–1.24)
GFR, Estimated: 47 mL/min — ABNORMAL LOW (ref >60)
Glucose, Bld: 181 mg/dL — ABNORMAL HIGH (ref 70–99)
Potassium: 3.9 mmol/L (ref 3.5–5.1)
Sodium: 136 mmol/L (ref 135–145)
Total Bilirubin: 0.3 mg/dL (ref 0.3–1.2)
Total Protein: 6.1 g/dL — ABNORMAL LOW (ref 6.5–8.1)

## 2020-09-28 LAB — MAGNESIUM: Magnesium: 1.8 mg/dL (ref 1.7–2.4)

## 2020-09-28 MED ORDER — HEPARIN SOD (PORK) LOCK FLUSH 100 UNIT/ML IV SOLN
500.0000 [IU] | Freq: Once | INTRAVENOUS | Status: AC | PRN
  Administered 2020-09-28: 500 [IU]

## 2020-09-28 MED ORDER — MAGNESIUM SULFATE 2 GM/50ML IV SOLN
2.0000 g | Freq: Once | INTRAVENOUS | Status: AC
  Administered 2020-09-28: 2 g via INTRAVENOUS
  Filled 2020-09-28: qty 50

## 2020-09-28 MED ORDER — POTASSIUM CHLORIDE IN NACL 20-0.9 MEQ/L-% IV SOLN
Freq: Once | INTRAVENOUS | Status: AC
  Filled 2020-09-28: qty 1000

## 2020-09-28 MED ORDER — SODIUM CHLORIDE 0.9% FLUSH
10.0000 mL | INTRAVENOUS | Status: DC | PRN
  Administered 2020-09-28: 10 mL

## 2020-09-28 MED ORDER — SODIUM CHLORIDE 0.9 % IV SOLN
150.0000 mg | Freq: Once | INTRAVENOUS | Status: AC
  Administered 2020-09-28: 150 mg via INTRAVENOUS
  Filled 2020-09-28: qty 150

## 2020-09-28 MED ORDER — SODIUM CHLORIDE 0.9 % IV SOLN
65.0000 mg/m2 | Freq: Once | INTRAVENOUS | Status: AC
  Administered 2020-09-28: 120 mg via INTRAVENOUS
  Filled 2020-09-28: qty 6

## 2020-09-28 MED ORDER — SODIUM CHLORIDE 0.9 % IV SOLN
30.0000 mg/m2 | Freq: Once | INTRAVENOUS | Status: AC
  Administered 2020-09-28: 55 mg via INTRAVENOUS
  Filled 2020-09-28: qty 55

## 2020-09-28 MED ORDER — SODIUM CHLORIDE 0.9 % IV SOLN
10.0000 mg | Freq: Once | INTRAVENOUS | Status: AC
  Administered 2020-09-28: 10 mg via INTRAVENOUS
  Filled 2020-09-28: qty 10

## 2020-09-28 MED ORDER — ATROPINE SULFATE 1 MG/ML IJ SOLN
0.5000 mg | Freq: Once | INTRAMUSCULAR | Status: AC | PRN
  Administered 2020-09-28: 0.5 mg via INTRAVENOUS
  Filled 2020-09-28: qty 1

## 2020-09-28 MED ORDER — SODIUM CHLORIDE 0.9 % IV SOLN: Freq: Once | INTRAVENOUS | Status: AC

## 2020-09-28 MED ORDER — PALONOSETRON HCL INJECTION 0.25 MG/5ML
0.2500 mg | Freq: Once | INTRAVENOUS | Status: AC
  Administered 2020-09-28: 0.25 mg via INTRAVENOUS
  Filled 2020-09-28: qty 5

## 2020-09-28 MED FILL — Isosorbide Mononitrate Tab ER 24HR 30 MG: ORAL | 90 days supply | Qty: 90 | Fill #1 | Status: AC

## 2020-09-28 NOTE — Patient Instructions (Signed)
Implanted Port Home Guide An implanted port is a device that is placed under the skin. It is usually placed in the chest. The device can be used to give IV medicine, to take blood, or for dialysis. You may have an implanted port if: You need IV medicine that would be irritating to the small veins in your hands or arms. You need IV medicines, such as antibiotics, for a long period of time. You need IV nutrition for a long period of time. You need dialysis. When you have a port, your health care provider can choose to use the port instead of veins in your arms for these procedures. You may have fewer limitations when using a port than you would if you used other types of long-term IVs, and you will likely be able to return to normal activities after your incision heals. An implanted port has two main parts: Reservoir. The reservoir is the part where a needle is inserted to give medicines or draw blood. The reservoir is round. After it is placed, it appears as a small, raised area under your skin. Catheter. The catheter is a thin, flexible tube that connects the reservoir to a vein. Medicine that is inserted into the reservoir goes into the catheter and then into the vein. How is my port accessed? To access your port: A numbing cream may be placed on the skin over the port site. Your health care provider will put on a mask and sterile gloves. The skin over your port will be cleaned carefully with a germ-killing soap and allowed to dry. Your health care provider will gently pinch the port and insert a needle into it. Your health care provider will check for a blood return to make sure the port is in the vein and is not clogged. If your port needs to remain accessed to get medicine continuously (constant infusion), your health care provider will place a clear bandage (dressing) over the needle site. The dressing and needle will need to be changed every week, or as told by your health care provider. What  is flushing? Flushing helps keep the port from getting clogged. Follow instructions from your health care provider about how and when to flush the port. Ports are usually flushed with saline solution or a medicine called heparin. The need for flushing will depend on how the port is used: If the port is only used from time to time to give medicines or draw blood, the port may need to be flushed: Before and after medicines have been given. Before and after blood has been drawn. As part of routine maintenance. Flushing may be recommended every 4-6 weeks. If a constant infusion is running, the port may not need to be flushed. Throw away any syringes in a disposal container that is meant for sharp items (sharps container). You can buy a sharps container from a pharmacy, or you can make one by using an empty hard plastic bottle with a cover. How long will my port stay implanted? The port can stay in for as long as your health care provider thinks it is needed. When it is time for the port to come out, a surgery will be done to remove it. The surgery will be similar to the procedure that was done to put the port in. Follow these instructions at home:  Flush your port as told by your health care provider. If you need an infusion over several days, follow instructions from your health care provider about how   to take care of your port site. Make sure you: Wash your hands with soap and water before you change your dressing. If soap and water are not available, use alcohol-based hand sanitizer. Change your dressing as told by your health care provider. Place any used dressings or infusion bags into a plastic bag. Throw that bag in the trash. Keep the dressing that covers the needle clean and dry. Do not get it wet. Do not use scissors or sharp objects near the tube. Keep the tube clamped, unless it is being used. Check your port site every day for signs of infection. Check for: Redness, swelling, or  pain. Fluid or blood. Pus or a bad smell. Protect the skin around the port site. Avoid wearing bra straps that rub or irritate the site. Protect the skin around your port from seat belts. Place a soft pad over your chest if needed. Bathe or shower as told by your health care provider. The site may get wet as long as you are not actively receiving an infusion. Return to your normal activities as told by your health care provider. Ask your health care provider what activities are safe for you. Carry a medical alert card or wear a medical alert bracelet at all times. This will let health care providers know that you have an implanted port in case of an emergency. Get help right away if: You have redness, swelling, or pain at the port site. You have fluid or blood coming from your port site. You have pus or a bad smell coming from the port site. You have a fever. Summary Implanted ports are usually placed in the chest for long-term IV access. Follow instructions from your health care provider about flushing the port and changing bandages (dressings). Take care of the area around your port by avoiding clothing that puts pressure on the area, and by watching for signs of infection. Protect the skin around your port from seat belts. Place a soft pad over your chest if needed. Get help right away if you have a fever or you have redness, swelling, pain, drainage, or a bad smell at the port site. This information is not intended to replace advice given to you by your health care provider. Make sure you discuss any questions you have with your health care provider. Document Revised: 03/23/2020 Document Reviewed: 05/18/2019 Elsevier Patient Education  2022 Elsevier Inc.  

## 2020-09-28 NOTE — Patient Instructions (Addendum)
[facility]  Discharge Instructions: Thank you for choosing [facility] to provide your oncology and hematology care.   If you have a lab appointment with the [facility], please go directly to the [facility] and check in at the registration area.  Wear comfortable clothing and clothing appropriate for easy access to any Portacath or PICC line.   We strive to give you quality time with your provider. You may need to reschedule your appointment if you arrive late (15 or more minutes).  Arriving late affects you and other patients whose appointments are after yours.  Also, if you miss three or more appointments without notifying the office, you may be dismissed from the clinic at the provider's discretion.      For prescription refill requests, have your pharmacy contact our office and allow 72 hours for refills to be completed.    Today you received the following chemotherapy and/or immunotherapy agents Cisplatin, Irinotecan   To help prevent nausea and vomiting after your treatment, we encourage you to take your nausea medication as directed.  BELOW ARE SYMPTOMS THAT SHOULD BE REPORTED IMMEDIATELY: *FEVER GREATER THAN 100.4 F (38 C) OR HIGHER *CHILLS OR SWEATING *NAUSEA AND VOMITING THAT IS NOT CONTROLLED WITH YOUR NAUSEA MEDICATION *UNUSUAL SHORTNESS OF BREATH *UNUSUAL BRUISING OR BLEEDING *URINARY PROBLEMS (pain or burning when urinating, or frequent urination) *BOWEL PROBLEMS (unusual diarrhea, constipation, pain near the anus) TENDERNESS IN MOUTH AND THROAT WITH OR WITHOUT PRESENCE OF ULCERS (sore throat, sores in mouth, or a toothache) UNUSUAL RASH, SWELLING OR PAIN  UNUSUAL VAGINAL DISCHARGE OR ITCHING   Items with * indicate a potential emergency and should be followed up as soon as possible or go to the Emergency Department if any problems should occur.  Please show the CHEMOTHERAPY ALERT CARD or IMMUNOTHERAPY ALERT CARD at check-in  to the Emergency Department and triage nurse. Should you have questions after your visit or need to cancel or reschedule your appointment, please contact [facility]  [phone] and follow the prompts.  Office hours are 8:00 a.m. to 4:30 p.m. Monday - Friday. Please note that voicemails left after 4:00 p.m. may not be returned until the following business day.  We are closed weekends and major holidays. You have access to a nurse at all times for urgent questions. Please call the main number to the clinic [phone] and follow the prompts.  For any non-urgent questions, you may also contact your provider using MyChart. We now offer e-Visits for anyone 69 and older to request care online for non-urgent symptoms. For details visit [email or web address].   Also download the MyChart app! Go to the app store, search "MyChart", open the app, select [facility], and log in with your MyChart username and password.  Due to Covid, a mask is required upon entering the hospital/clinic. If you do not have a mask, one will be given to you upon arrival. For doctor visits, patients may have 1 support person aged 17 or older with them. For treatment visits, patients cannot have anyone with them due to current Covid guidelines and our immunocompromised population. [facility]  Discharge Instructions: Thank you for choosing [facility] to provide your oncology and hematology care.   If you have a lab appointment with the [facility], please go directly to the [facility] and check in at the registration area.  Wear comfortable clothing and clothing  appropriate for easy access to any Portacath or PICC line.   We strive to give you quality time with your provider. You may need to reschedule your appointment if you arrive late (15 or more minutes).  Arriving late affects you and other patients whose appointments are after yours.  Also, if you  miss three or more appointments without notifying the office, you may be dismissed from the clinic at the provider's discretion.      For prescription refill requests, have your pharmacy contact our office and allow 72 hours for refills to be completed.    Today you received the following chemotherapy and/or immunotherapy agents  camptosar, cisplatin   To help prevent nausea and vomiting after your treatment, we encourage you to take your nausea medication as directed.  BELOW ARE SYMPTOMS THAT SHOULD BE REPORTED IMMEDIATELY: *FEVER GREATER THAN 100.4 F (38 C) OR HIGHER *CHILLS OR SWEATING *NAUSEA AND VOMITING THAT IS NOT CONTROLLED WITH YOUR NAUSEA MEDICATION *UNUSUAL SHORTNESS OF BREATH *UNUSUAL BRUISING OR BLEEDING *URINARY PROBLEMS (pain or burning when urinating, or frequent urination) *BOWEL PROBLEMS (unusual diarrhea, constipation, pain near the anus) TENDERNESS IN MOUTH AND THROAT WITH OR WITHOUT PRESENCE OF ULCERS (sore throat, sores in mouth, or a toothache) UNUSUAL RASH, SWELLING OR PAIN  UNUSUAL VAGINAL DISCHARGE OR ITCHING   Items with * indicate a potential emergency and should be followed up as soon as possible or go to the Emergency Department if any problems should occur.  Please show the CHEMOTHERAPY ALERT CARD or IMMUNOTHERAPY ALERT CARD at check-in to the Emergency Department and triage nurse. Should you have questions after your visit or need to cancel or reschedule your appointment, please contact [facility]  [phone] and follow the prompts.  Office hours are 8:00 a.m. to 4:30 p.m. Monday - Friday. Please note that voicemails left after 4:00 p.m. may not be returned until the following business day.  We are closed weekends and major holidays. You have access to a nurse at all times for urgent questions. Please call the main number to the clinic [phone] and follow the prompts.  For any non-urgent questions, you may also contact your  provider using MyChart. We now offer e-Visits for anyone 1 and older to request care online for non-urgent symptoms. For details visit [email or web address].   Also download the MyChart app! Go to the app store, search "MyChart", open the app, select [facility], and log in with your MyChart username and password.  Due to Covid, a mask is required upon entering the hospital/clinic. If you do not have a mask, one will be given to you upon arrival. For doctor visits, patients may have 1 support person aged 6 or older with them. For treatment visits, patients cannot have anyone with them due to current Covid guidelines and our immunocompromised population.

## 2020-09-29 DIAGNOSIS — M9902 Segmental and somatic dysfunction of thoracic region: Secondary | ICD-10-CM | POA: Diagnosis not present

## 2020-09-29 DIAGNOSIS — M9901 Segmental and somatic dysfunction of cervical region: Secondary | ICD-10-CM | POA: Diagnosis not present

## 2020-09-29 DIAGNOSIS — M9904 Segmental and somatic dysfunction of sacral region: Secondary | ICD-10-CM | POA: Diagnosis not present

## 2020-09-29 DIAGNOSIS — M9903 Segmental and somatic dysfunction of lumbar region: Secondary | ICD-10-CM | POA: Diagnosis not present

## 2020-09-30 ENCOUNTER — Telehealth: Payer: Self-pay | Admitting: *Deleted

## 2020-09-30 NOTE — Telephone Encounter (Signed)
Message received from patient stating that he had episodes of SOB yesterday and today along with rectal bleeding and wanted to make Dr. Marin Olp aware.  Call placed back to patient and patient states that his stools are brown and when he wipes there is red blood. He states that his SOB happens only with exertion at times and that this is not new for him.  Dr. Marin Olp notified.  Call placed back to patient and patient notified if bleeding or SOB worsens to go to the ER at [facility].  Pt voiced understanding and is appreciative of call back.

## 2020-10-03 ENCOUNTER — Other Ambulatory Visit: Payer: Self-pay | Admitting: *Deleted

## 2020-10-03 ENCOUNTER — Inpatient Hospital Stay: Payer: Medicare Other

## 2020-10-03 ENCOUNTER — Encounter: Payer: Self-pay | Admitting: *Deleted

## 2020-10-03 ENCOUNTER — Telehealth: Payer: Self-pay | Admitting: *Deleted

## 2020-10-03 ENCOUNTER — Other Ambulatory Visit: Payer: Self-pay

## 2020-10-03 VITALS — BP 131/76 | HR 93 | Resp 17

## 2020-10-03 DIAGNOSIS — C7951 Secondary malignant neoplasm of bone: Secondary | ICD-10-CM

## 2020-10-03 DIAGNOSIS — Z5111 Encounter for antineoplastic chemotherapy: Secondary | ICD-10-CM | POA: Diagnosis not present

## 2020-10-03 DIAGNOSIS — C7A8 Other malignant neuroendocrine tumors: Secondary | ICD-10-CM

## 2020-10-03 DIAGNOSIS — Z95828 Presence of other vascular implants and grafts: Secondary | ICD-10-CM

## 2020-10-03 DIAGNOSIS — C61 Malignant neoplasm of prostate: Secondary | ICD-10-CM | POA: Diagnosis not present

## 2020-10-03 DIAGNOSIS — K5900 Constipation, unspecified: Secondary | ICD-10-CM | POA: Diagnosis not present

## 2020-10-03 DIAGNOSIS — D509 Iron deficiency anemia, unspecified: Secondary | ICD-10-CM | POA: Diagnosis not present

## 2020-10-03 DIAGNOSIS — R197 Diarrhea, unspecified: Secondary | ICD-10-CM | POA: Diagnosis not present

## 2020-10-03 DIAGNOSIS — K909 Intestinal malabsorption, unspecified: Secondary | ICD-10-CM | POA: Diagnosis not present

## 2020-10-03 LAB — COMPREHENSIVE METABOLIC PANEL
ALT: 18 U/L (ref 0–44)
AST: 13 U/L — ABNORMAL LOW (ref 15–41)
Albumin: 4 g/dL (ref 3.5–5.0)
Alkaline Phosphatase: 55 U/L (ref 38–126)
Anion gap: 10 (ref 5–15)
BUN: 31 mg/dL — ABNORMAL HIGH (ref 8–23)
CO2: 23 mmol/L (ref 22–32)
Calcium: 9.2 mg/dL (ref 8.9–10.3)
Chloride: 100 mmol/L (ref 98–111)
Creatinine, Ser: 1.51 mg/dL — ABNORMAL HIGH (ref 0.61–1.24)
GFR, Estimated: 49 mL/min — ABNORMAL LOW (ref >60)
Glucose, Bld: 110 mg/dL — ABNORMAL HIGH (ref 70–99)
Potassium: 4 mmol/L (ref 3.5–5.1)
Sodium: 133 mmol/L — ABNORMAL LOW (ref 135–145)
Total Bilirubin: 0.4 mg/dL (ref 0.3–1.2)
Total Protein: 6.2 g/dL — ABNORMAL LOW (ref 6.5–8.1)

## 2020-10-03 LAB — CBC WITH DIFFERENTIAL (CANCER CENTER ONLY)
Abs Immature Granulocytes: 0.01 10*3/uL (ref 0.00–0.07)
Basophils Absolute: 0 10*3/uL (ref 0.0–0.1)
Basophils Relative: 0 %
Eosinophils Absolute: 0 10*3/uL (ref 0.0–0.5)
Eosinophils Relative: 1 %
HCT: 27.6 % — ABNORMAL LOW (ref 39.0–52.0)
Hemoglobin: 9.3 g/dL — ABNORMAL LOW (ref 13.0–17.0)
Immature Granulocytes: 0 %
Lymphocytes Relative: 21 %
Lymphs Abs: 0.7 10*3/uL (ref 0.7–4.0)
MCH: 33.5 pg (ref 26.0–34.0)
MCHC: 33.7 g/dL (ref 30.0–36.0)
MCV: 99.3 fL (ref 80.0–100.0)
Monocytes Absolute: 0.3 10*3/uL (ref 0.1–1.0)
Monocytes Relative: 7 %
Neutro Abs: 2.4 10*3/uL (ref 1.7–7.7)
Neutrophils Relative %: 71 %
Platelet Count: 180 10*3/uL (ref 150–400)
RBC: 2.78 MIL/uL — ABNORMAL LOW (ref 4.22–5.81)
RDW: 16.5 % — ABNORMAL HIGH (ref 11.5–15.5)
WBC Count: 3.4 10*3/uL — ABNORMAL LOW (ref 4.0–10.5)
nRBC: 0 % (ref 0.0–0.2)

## 2020-10-03 LAB — MAGNESIUM: Magnesium: 1.8 mg/dL (ref 1.7–2.4)

## 2020-10-03 MED ORDER — SODIUM CHLORIDE 0.9% FLUSH
10.0000 mL | Freq: Once | INTRAVENOUS | Status: AC
  Administered 2020-10-03: 10 mL via INTRAVENOUS

## 2020-10-03 MED ORDER — HEPARIN SOD (PORK) LOCK FLUSH 100 UNIT/ML IV SOLN
500.0000 [IU] | Freq: Once | INTRAVENOUS | Status: AC
  Administered 2020-10-03: 500 [IU] via INTRAVENOUS

## 2020-10-03 NOTE — Telephone Encounter (Signed)
Patient called with an update to his condition.  Patient has not had anymore SOB since Friday and no bleeding, just no energy.  Dr Marin Olp notified.  Would like for patient to come in today for labwork to assess situation. Appt made for today

## 2020-10-06 ENCOUNTER — Encounter: Payer: Self-pay | Admitting: Hematology & Oncology

## 2020-10-06 ENCOUNTER — Other Ambulatory Visit: Payer: Self-pay | Admitting: *Deleted

## 2020-10-06 ENCOUNTER — Other Ambulatory Visit (HOSPITAL_BASED_OUTPATIENT_CLINIC_OR_DEPARTMENT_OTHER): Payer: Self-pay

## 2020-10-06 MED ORDER — SOLIFENACIN SUCCINATE 10 MG PO TABS
10.0000 mg | ORAL_TABLET | Freq: Every day | ORAL | 1 refills | Status: DC
  Filled 2020-10-06: qty 30, 30d supply, fill #0
  Filled 2020-11-03: qty 30, 30d supply, fill #1

## 2020-10-06 NOTE — Telephone Encounter (Signed)
Call received from patient requesting a refill for Vesicare 10 mg due to urinary frequency. Dr. Marin Olp notified and prescription sent to [facility] per order of Dr. Marin Olp.  Pt notified and aware.

## 2020-10-07 ENCOUNTER — Telehealth: Payer: Self-pay | Admitting: *Deleted

## 2020-10-07 ENCOUNTER — Inpatient Hospital Stay: Payer: Medicare Other

## 2020-10-07 ENCOUNTER — Other Ambulatory Visit: Payer: Self-pay

## 2020-10-07 ENCOUNTER — Other Ambulatory Visit (HOSPITAL_BASED_OUTPATIENT_CLINIC_OR_DEPARTMENT_OTHER): Payer: Self-pay

## 2020-10-07 ENCOUNTER — Other Ambulatory Visit: Payer: Self-pay | Admitting: Hematology & Oncology

## 2020-10-07 DIAGNOSIS — R399 Unspecified symptoms and signs involving the genitourinary system: Secondary | ICD-10-CM

## 2020-10-07 DIAGNOSIS — C7951 Secondary malignant neoplasm of bone: Secondary | ICD-10-CM

## 2020-10-07 DIAGNOSIS — D509 Iron deficiency anemia, unspecified: Secondary | ICD-10-CM | POA: Diagnosis not present

## 2020-10-07 DIAGNOSIS — Z5111 Encounter for antineoplastic chemotherapy: Secondary | ICD-10-CM | POA: Diagnosis not present

## 2020-10-07 DIAGNOSIS — C61 Malignant neoplasm of prostate: Secondary | ICD-10-CM

## 2020-10-07 DIAGNOSIS — K909 Intestinal malabsorption, unspecified: Secondary | ICD-10-CM | POA: Diagnosis not present

## 2020-10-07 DIAGNOSIS — R197 Diarrhea, unspecified: Secondary | ICD-10-CM | POA: Diagnosis not present

## 2020-10-07 DIAGNOSIS — K5900 Constipation, unspecified: Secondary | ICD-10-CM | POA: Diagnosis not present

## 2020-10-07 DIAGNOSIS — C7A8 Other malignant neuroendocrine tumors: Secondary | ICD-10-CM

## 2020-10-07 LAB — URINALYSIS, COMPLETE (UACMP) WITH MICROSCOPIC
Bilirubin Urine: NEGATIVE
Glucose, UA: NEGATIVE mg/dL
Hgb urine dipstick: NEGATIVE
Ketones, ur: NEGATIVE mg/dL
Leukocytes,Ua: NEGATIVE
Nitrite: NEGATIVE
Protein, ur: NEGATIVE mg/dL
Specific Gravity, Urine: 1.03 (ref 1.005–1.030)
pH: 6 (ref 5.0–8.0)

## 2020-10-07 MED ORDER — PANTOPRAZOLE SODIUM 40 MG PO TBEC
DELAYED_RELEASE_TABLET | Freq: Every day | ORAL | 6 refills | Status: DC
  Filled 2020-10-07: qty 30, 30d supply, fill #0
  Filled 2020-11-03: qty 30, 30d supply, fill #1
  Filled 2020-11-30: qty 30, 30d supply, fill #2
  Filled 2021-01-02: qty 30, 30d supply, fill #3
  Filled 2021-02-08: qty 30, 30d supply, fill #4
  Filled 2021-03-09: qty 30, 30d supply, fill #5
  Filled 2021-04-03: qty 30, 30d supply, fill #6

## 2020-10-07 NOTE — Telephone Encounter (Signed)
Call received from patient stating that he has a "rolling stomach, diarrhea during the night and feels like he has a UTI."  Dr. Marin Olp notified and would like for pt to come in for a U/A and culture today.  Call placed back to patient and patient notified to come in today for U/A

## 2020-10-09 LAB — URINE CULTURE: Culture: NO GROWTH

## 2020-10-18 DIAGNOSIS — Z923 Personal history of irradiation: Secondary | ICD-10-CM | POA: Insufficient documentation

## 2020-10-19 ENCOUNTER — Encounter: Payer: Self-pay | Admitting: Cardiology

## 2020-10-19 ENCOUNTER — Other Ambulatory Visit: Payer: Self-pay

## 2020-10-19 ENCOUNTER — Ambulatory Visit (INDEPENDENT_AMBULATORY_CARE_PROVIDER_SITE_OTHER): Payer: Medicare Other | Admitting: Cardiology

## 2020-10-19 VITALS — BP 124/84 | HR 95 | Ht 68.0 in | Wt 159.0 lb

## 2020-10-19 DIAGNOSIS — E119 Type 2 diabetes mellitus without complications: Secondary | ICD-10-CM

## 2020-10-19 DIAGNOSIS — C61 Malignant neoplasm of prostate: Secondary | ICD-10-CM

## 2020-10-19 DIAGNOSIS — I10 Essential (primary) hypertension: Secondary | ICD-10-CM | POA: Diagnosis not present

## 2020-10-19 DIAGNOSIS — R0609 Other forms of dyspnea: Secondary | ICD-10-CM | POA: Diagnosis not present

## 2020-10-19 DIAGNOSIS — C7A8 Other malignant neuroendocrine tumors: Secondary | ICD-10-CM | POA: Diagnosis not present

## 2020-10-19 NOTE — Progress Notes (Signed)
Cardiology Office Note:    Date:  10/19/2020   ID:  [name], DOB 1945-06-15, MRN [id number]  PCP:  Nicoletta Dress, MD  Cardiologist:  Jenne Campus, MD    Referring MD: Nicoletta Dress, MD   Chief Complaint  Patient presents with   Follow-up    History of Present Illness:    [name] is a 75 y.o. male with past medical history significant for essential hypertension, dyslipidemia, atypical chest pain.  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  He came to [place] for regular follow-up but he mentioned that about 5 to 6 weeks ago he did have episode of shortness of breath lasting for about 30 minutes interestingly he said before when he was constipated he got the same kind of episodes.  Otherwise he seems to be doing well and for somebody with metastatic cancer he looks pretty good.  He is planning to go to [place] within the next few days to visit his family.  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 Intolerant to multiple statins   Dyspnea    with exertion   Essential hypertension 07/12/2014   Goals of care, counseling/discussion 01/29/2018   High blood sugar    History of radiation therapy 04/13/20-04/27/20   SBRT right sacrum - Dr. [name]   History of radiation therapy 04/22/2014-05/06/2014   right sacrum   Dr [name]   History of radiation therapy 10/26/2015-11/09/2015   right sacro-iliac    Dr [name]   Iron deficiency anemia due to chronic blood loss 02/04/2019   Ischemic chest pain ([place]) 07/12/2014   Formatting of this note  might be different from the original. Minimally abnormal stress test but does not want to do cardiac catheterization, asymptomatic.   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   Type 2 diabetes mellitus ([place]) 03/23/2018    Past Surgical History:  Procedure Laterality Date   CIRCUMCISION     when patient was 75 years old   COLONOSCOPY     COLONOSCOPY W/ POLYPECTOMY     ESOPHAGOGASTRODUODENOSCOPY     HAND SURGERY Left    fracture   IR IMAGING GUIDED PORT INSERTION  02/10/2018   LUMBAR LAMINECTOMY/DECOMPRESSION MICRODISCECTOMY Right 09/12/2016   Procedure: LAMINECTOMY AND FORAMINOTOMY RIGHT LUMBAR FIVE- SACRAL ONE, SACRAL ONE- SACRAL TWO DECOMPRESSION OF RIGHT SACRAL ONE NERVE ROOT;  Surgeon: [name], MD;  Location: [facility];  Service: Neurosurgery;  Laterality: Right;   ROBOT ASSISTED LAPAROSCOPIC RADICAL PROSTATECTOMY     TONSILECTOMY/ADENOIDECTOMY WITH MYRINGOTOMY      Current Medications: Current Meds  Medication Sig   acetaminophen (TYLENOL) 500 MG tablet Take 1 tablet (  500 mg total) by mouth every 4 (four) hours as needed for mild pain, headache or fever (fever >/= 101).   ascorbic acid (VITAMIN C) 500 MG tablet Take 1 tablet (500 mg total) by mouth daily.   aspirin 81 MG tablet Take 81 mg by mouth daily.   dexamethasone (DECADRON) 4 MG tablet Take 4 mg by mouth daily. X 3 days   ezetimibe (ZETIA) 10 MG tablet TAKE 1 TABLET BY MOUTH DAILY (Patient taking differently: Take 10 mg by mouth daily.)   GENERLAC 10 GM/15ML SOLN Take 15 mLs (10 g total) by mouth daily as needed for constipation.    isosorbide mononitrate (IMDUR) 30 MG 24 hr tablet TAKE 1 TABLET (30 MG TOTAL) BY MOUTH DAILY. (Patient taking differently: Take 30 mg by mouth daily.)   lidocaine-prilocaine (EMLA) cream Apply to affected area once (Patient taking differently: Apply 1 application topically once. Apply to affected area once used during chemo treatment)   loperamide (IMODIUM A-D) 2 MG tablet Take 2 tablets by mouth at diarrhea onset, then 1 tablet every 2 hours until 12 hours with no bowel movment. May take 2 tablets every 4 hours at night. If diarrhea recurs repeat. (Patient taking differently: Take 2 mg by mouth as needed for diarrhea or loose stools. Take 2 tablets by mouth at diarrhea onset, then 1 tablet every 2 hours until 12 hours with no bowel movment. May take 2 tablets every 4 hours at night. If diarrhea recurs repeat.)   ondansetron (ZOFRAN) 8 MG tablet Take 1 tablet (8 mg total) by mouth 2 (two) times daily as needed. Start on the third day after cisplatin chemotherapy. (Patient taking differently: Take 8 mg by mouth 2 (two) times daily as needed for vomiting or nausea. Start on the third day after cisplatin chemotherapy.)   OVER THE COUNTER MEDICATION 1 each by Other route See admin instructions. Juice Plus -- 8 capsule of each the garden, vineyard, and orchard twice a day.   pantoprazole (PROTONIX) 40 MG tablet TAKE 1 TABLET (40 MG TOTAL) BY MOUTH AT BEDTIME. (Patient taking differently: Take 40 mg by mouth at bedtime.)   prochlorperazine (COMPAZINE) 10 MG tablet Take 1 tablet (10 mg total) by mouth every 6 (six) hours as needed for nausea or vomiting.   solifenacin (VESICARE) 10 MG tablet Take 1 tablet (10 mg total) by mouth daily.   Zinc 50 MG CAPS Take 50 mg by mouth daily.     Allergies:   Codeine and Hydrocodone   Social History   Socioeconomic History   Marital status: Married    Spouse name: Not on file   Number of children: 2   Years of education: Not on file   Highest education level: Not on  file  Occupational History   Occupation: real estate aprais.    Employer: [employer]  Tobacco Use   Smoking status: Never   Smokeless tobacco: Never   Tobacco comments:    never used tobacco  Vaping Use   Vaping Use: Never used  Substance and Sexual Activity   Alcohol use: No    Alcohol/week: 0.0 standard drinks   Drug use: No   Sexual activity: Not on file  Other Topics Concern   Not on file  Social History Narrative   Not on file   Social Determinants of Health   Financial Resource Strain: Not on file  Food Insecurity: Not on file  Transportation Needs: Not on file  Physical Activity: Not on file  Stress: Not on file  Social Connections: Not on file     Family History: The patient's family history includes Heart disease in his father and mother; Prostate cancer in his father. ROS:   Please see the history of present illness.    All 14 point review of systems negative except as described per history of present illness  EKGs/Labs/Other Studies Reviewed:      Recent Labs: 10/03/2020: ALT 18; BUN 31; Creatinine, Ser 1.51; Hemoglobin 9.3; Magnesium 1.8; Platelet Count 180; Potassium 4.0; Sodium 133  Recent Lipid Panel    Component Value Date/Time   CHOL 230 (H) 03/23/2018 1847   TRIG 78 05/23/2020 2052   HDL 46 03/23/2018 1847   CHOLHDL 5.0 03/23/2018 1847   VLDL 42 (H) 03/23/2018 1847   LDLCALC 142 (H) 03/23/2018 1847    Physical Exam:    VS:  BP 124/84 (BP Location: Right Arm, Patient Position: Sitting)   Pulse 95   Ht 5\' 8"  (1.727 m)   Wt 159 lb (72.1 kg)   SpO2 96%   BMI 24.18 kg/m     Wt Readings from Last 3 Encounters:  10/19/20 159 lb (72.1 kg)  09/20/20 164 lb (74.4 kg)  09/07/20 155 lb (70.3 kg)     GEN:  Well nourished, well developed in no acute distress HEENT: Normal NECK: No JVD; No carotid bruits LYMPHATICS: No lymphadenopathy CARDIAC: RRR, no murmurs, no rubs, no gallops RESPIRATORY:  Clear to auscultation without  rales, wheezing or rhonchi  ABDOMEN: Soft, non-tender, non-distended MUSCULOSKELETAL:  No edema; No deformity  SKIN: Warm and dry LOWER EXTREMITIES: no swelling NEUROLOGIC:  Alert and oriented x 3 PSYCHIATRIC:  Normal affect   ASSESSMENT:    1. Prostate cancer ([place])   2. Neuroendocrine carcinoma of unknown origin ([place])   3. Diabetes mellitus without complication ([place])   4. Essential hypertension   5. Dyspnea on exertion    PLAN:    In order of problems listed above:  Prostate cancer follow-up excellently by oncology team.  Metastasis to bones as well as lungs Essential hypertension blood pressure well controlled continue present management. Dyspnea on exertion exertion I will schedule him to have proBNP done as well as echocardiogram to reassess left ventricle ejection fraction.  Even though he had echocardiogram done few months ago this is a new clinical study he does have more shortness of breath.  More that shortness of breath could be coming from involvement of his lungs with the cancer. Dyslipidemia I did review his K PN however the data I have is from 2020 with LDL of 142 HDL 46   Medication Adjustments/Labs and Tests Ordered: Current medicines are reviewed at length with the patient today.  Concerns regarding medicines are outlined above.  No orders of the defined types were placed in this encounter.  Medication changes: No orders of the defined types were placed in this encounter.   Signed, Park Liter, MD, [facility] 10/19/2020 2:33 PM    [place]

## 2020-10-19 NOTE — Addendum Note (Signed)
Addended by: Darrel Reach on: 10/19/2020 04:12 PM   Modules accepted: Orders

## 2020-10-19 NOTE — Patient Instructions (Signed)
Medication Instructions:  Your physician recommends that you continue on your current medications as directed. Please refer to the Current Medication list given to you today.  *If you need a refill on your cardiac medications before your next appointment, please call your pharmacy*   Lab Work: Your physician recommends that you return for lab work in: Today Pro BNP and BMET If you have labs (blood work) drawn today and your tests are completely normal, you will receive your results only by: MyChart Message (if you have [place]) OR A paper copy in the mail If you have any lab test that is abnormal or we need to change your treatment, we will call you to review the results.   Testing/Procedures: Your physician has requested that you have an echocardiogram. Echocardiography is a painless test that uses sound waves to create images of your heart. It provides your doctor with information about the size and shape of your heart and how well your heart's chambers and valves are working. This procedure takes approximately one hour. There are no restrictions for this procedure.    Follow-Up: At [facility], you and your health needs are our priority.  As part of our continuing mission to provide you with exceptional heart care, we have created designated Provider Care Teams.  These Care Teams include your primary Cardiologist (physician) and Advanced Practice Providers (APPs -  Physician Assistants and Nurse Practitioners) who all work together to provide you with the care you need, when you need it.  We recommend signing up for the patient portal called "MyChart".  Sign up information is provided on this After Visit Summary.  MyChart is used to connect with patients for Virtual Visits (Telemedicine).  Patients are able to view lab/test results, encounter notes, upcoming appointments, etc.  Non-urgent messages can be sent to your provider as well.   To learn more about what you can do with MyChart, go  to [email or web address].    Your next appointment:   3 month(s)  The format for your next appointment:   In Person  Provider:   Jenne Campus, MD   Other Instructions

## 2020-10-19 NOTE — Addendum Note (Signed)
Addended by: Darrel Reach on: 10/19/2020 02:41 PM   Modules accepted: Orders

## 2020-10-20 ENCOUNTER — Encounter: Payer: Self-pay | Admitting: Hematology & Oncology

## 2020-10-20 ENCOUNTER — Inpatient Hospital Stay (HOSPITAL_BASED_OUTPATIENT_CLINIC_OR_DEPARTMENT_OTHER): Payer: Medicare Other | Admitting: Hematology & Oncology

## 2020-10-20 ENCOUNTER — Inpatient Hospital Stay: Payer: Medicare Other | Attending: Hematology & Oncology

## 2020-10-20 ENCOUNTER — Inpatient Hospital Stay: Payer: Medicare Other

## 2020-10-20 ENCOUNTER — Telehealth: Payer: Self-pay | Admitting: *Deleted

## 2020-10-20 ENCOUNTER — Other Ambulatory Visit: Payer: Self-pay

## 2020-10-20 VITALS — BP 127/69 | HR 95 | Temp 97.8°F | Resp 18 | Ht 68.0 in | Wt 159.8 lb

## 2020-10-20 VITALS — BP 148/77 | HR 92 | Temp 98.1°F | Resp 18

## 2020-10-20 DIAGNOSIS — M2550 Pain in unspecified joint: Secondary | ICD-10-CM | POA: Insufficient documentation

## 2020-10-20 DIAGNOSIS — C61 Malignant neoplasm of prostate: Secondary | ICD-10-CM

## 2020-10-20 DIAGNOSIS — R531 Weakness: Secondary | ICD-10-CM | POA: Insufficient documentation

## 2020-10-20 DIAGNOSIS — Z5111 Encounter for antineoplastic chemotherapy: Secondary | ICD-10-CM | POA: Insufficient documentation

## 2020-10-20 DIAGNOSIS — C7A8 Other malignant neuroendocrine tumors: Secondary | ICD-10-CM | POA: Diagnosis not present

## 2020-10-20 DIAGNOSIS — C7951 Secondary malignant neoplasm of bone: Secondary | ICD-10-CM

## 2020-10-20 DIAGNOSIS — Z79899 Other long term (current) drug therapy: Secondary | ICD-10-CM | POA: Diagnosis not present

## 2020-10-20 DIAGNOSIS — D508 Other iron deficiency anemias: Secondary | ICD-10-CM | POA: Insufficient documentation

## 2020-10-20 DIAGNOSIS — K909 Intestinal malabsorption, unspecified: Secondary | ICD-10-CM | POA: Diagnosis not present

## 2020-10-20 DIAGNOSIS — C7B8 Other secondary neuroendocrine tumors: Secondary | ICD-10-CM

## 2020-10-20 DIAGNOSIS — R0602 Shortness of breath: Secondary | ICD-10-CM | POA: Insufficient documentation

## 2020-10-20 DIAGNOSIS — Z95828 Presence of other vascular implants and grafts: Secondary | ICD-10-CM | POA: Diagnosis not present

## 2020-10-20 LAB — CBC WITH DIFFERENTIAL (CANCER CENTER ONLY)
Abs Immature Granulocytes: 0.03 10*3/uL (ref 0.00–0.07)
Basophils Absolute: 0 10*3/uL (ref 0.0–0.1)
Basophils Relative: 0 %
Eosinophils Absolute: 0 10*3/uL (ref 0.0–0.5)
Eosinophils Relative: 1 %
HCT: 24.2 % — ABNORMAL LOW (ref 39.0–52.0)
Hemoglobin: 7.8 g/dL — ABNORMAL LOW (ref 13.0–17.0)
Immature Granulocytes: 1 %
Lymphocytes Relative: 30 %
Lymphs Abs: 1 10*3/uL (ref 0.7–4.0)
MCH: 34.5 pg — ABNORMAL HIGH (ref 26.0–34.0)
MCHC: 32.2 g/dL (ref 30.0–36.0)
MCV: 107.1 fL — ABNORMAL HIGH (ref 80.0–100.0)
Monocytes Absolute: 0.6 10*3/uL (ref 0.1–1.0)
Monocytes Relative: 18 %
Neutro Abs: 1.6 10*3/uL — ABNORMAL LOW (ref 1.7–7.7)
Neutrophils Relative %: 50 %
Platelet Count: 259 10*3/uL (ref 150–400)
RBC: 2.26 MIL/uL — ABNORMAL LOW (ref 4.22–5.81)
RDW: 16.7 % — ABNORMAL HIGH (ref 11.5–15.5)
WBC Count: 3.2 10*3/uL — ABNORMAL LOW (ref 4.0–10.5)
nRBC: 0 % (ref 0.0–0.2)

## 2020-10-20 LAB — CMP (CANCER CENTER ONLY)
ALT: 12 U/L (ref 0–44)
AST: 12 U/L — ABNORMAL LOW (ref 15–41)
Albumin: 4.1 g/dL (ref 3.5–5.0)
Alkaline Phosphatase: 66 U/L (ref 38–126)
Anion gap: 9 (ref 5–15)
BUN: 20 mg/dL (ref 8–23)
CO2: 25 mmol/L (ref 22–32)
Calcium: 9.3 mg/dL (ref 8.9–10.3)
Chloride: 103 mmol/L (ref 98–111)
Creatinine: 1.44 mg/dL — ABNORMAL HIGH (ref 0.61–1.24)
GFR, Estimated: 52 mL/min — ABNORMAL LOW (ref >60)
Glucose, Bld: 142 mg/dL — ABNORMAL HIGH (ref 70–99)
Potassium: 4 mmol/L (ref 3.5–5.1)
Sodium: 137 mmol/L (ref 135–145)
Total Bilirubin: 0.2 mg/dL — ABNORMAL LOW (ref 0.3–1.2)
Total Protein: 6.1 g/dL — ABNORMAL LOW (ref 6.5–8.1)

## 2020-10-20 LAB — BASIC METABOLIC PANEL
BUN/Creatinine Ratio: 12 (ref 10–24)
BUN: 17 mg/dL (ref 8–27)
CO2: 22 mmol/L (ref 20–29)
Calcium: 10 mg/dL (ref 8.6–10.2)
Chloride: 100 mmol/L (ref 96–106)
Creatinine, Ser: 1.45 mg/dL — ABNORMAL HIGH (ref 0.76–1.27)
Glucose: 114 mg/dL — ABNORMAL HIGH (ref 70–99)
Potassium: 4.6 mmol/L (ref 3.5–5.2)
Sodium: 139 mmol/L (ref 134–144)
eGFR: 51 mL/min/{1.73_m2} — ABNORMAL LOW (ref >59)

## 2020-10-20 LAB — PREPARE RBC (CROSSMATCH)

## 2020-10-20 LAB — PRO B NATRIURETIC PEPTIDE: NT-Pro BNP: 375 pg/mL (ref 0–376)

## 2020-10-20 LAB — LACTATE DEHYDROGENASE: LDH: 156 U/L (ref 98–192)

## 2020-10-20 LAB — MAGNESIUM: Magnesium: 1.9 mg/dL (ref 1.7–2.4)

## 2020-10-20 MED ORDER — SODIUM CHLORIDE 0.9% IV SOLUTION
250.0000 mL | Freq: Once | INTRAVENOUS | Status: AC
  Administered 2020-10-20: 250 mL via INTRAVENOUS

## 2020-10-20 MED ORDER — ACETAMINOPHEN 325 MG PO TABS
650.0000 mg | ORAL_TABLET | Freq: Once | ORAL | Status: AC
  Administered 2020-10-20: 650 mg via ORAL
  Filled 2020-10-20: qty 2

## 2020-10-20 MED ORDER — SODIUM CHLORIDE 0.9% FLUSH
10.0000 mL | INTRAVENOUS | Status: AC | PRN
  Administered 2020-10-20: 10 mL

## 2020-10-20 MED ORDER — DIPHENHYDRAMINE HCL 25 MG PO CAPS
25.0000 mg | ORAL_CAPSULE | Freq: Once | ORAL | Status: AC
  Administered 2020-10-20: 25 mg via ORAL
  Filled 2020-10-20: qty 1

## 2020-10-20 MED ORDER — LEUPROLIDE ACETATE (3 MONTH) 22.5 MG ~~LOC~~ KIT
22.5000 mg | PACK | Freq: Once | SUBCUTANEOUS | Status: AC
  Administered 2020-10-20: 22.5 mg via SUBCUTANEOUS
  Filled 2020-10-20: qty 22.5

## 2020-10-20 MED ORDER — DENOSUMAB 120 MG/1.7ML ~~LOC~~ SOLN
120.0000 mg | Freq: Once | SUBCUTANEOUS | Status: AC
  Administered 2020-10-20: 120 mg via SUBCUTANEOUS
  Filled 2020-10-20: qty 1.7

## 2020-10-20 MED ORDER — SODIUM CHLORIDE 0.9% FLUSH
10.0000 mL | Freq: Once | INTRAVENOUS | Status: AC
  Administered 2020-10-20: 10 mL via INTRAVENOUS

## 2020-10-20 MED ORDER — FUROSEMIDE 10 MG/ML IJ SOLN: 20.0000 mg | Freq: Once | INTRAMUSCULAR | Status: DC

## 2020-10-20 MED ORDER — HEPARIN SOD (PORK) LOCK FLUSH 100 UNIT/ML IV SOLN
500.0000 [IU] | Freq: Every day | INTRAVENOUS | Status: AC | PRN
  Administered 2020-10-20: 500 [IU]

## 2020-10-20 NOTE — Progress Notes (Signed)
Hematology and Oncology Follow Up Visit  [name] [id number] 02-Jul-1945 75 y.o. 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 Iron def anemia -- malabsorption   Past Therapy: Zytiga 1000 mg by mouth daily - discontinued on 08/15/2016 Xtandi 160 mg po q day - start on 08/15/2016 - discontinued Radium-223 therapy -- s/p cycle #6 Palliative radiation to the right sacrum Lutathera - s/p cycle 4/4 --last dose on 07/11/2017 Carbo/VP-16/Tecentriq --  S/p cycle #5 Keytruda q 6 week -- Maintanence -- s/p cycle #3 - changed from 3 to 6 week on 06/30/2018 Taxotere/Keytruda -- start on 09/17/2018 -- s/p cycle #10 -- d/c due to progression   Current Therapy:        Lurbinectedin -- started on 04/29/2019, s/p cycle 17 -- d/c on 05/2020 due to progression CDDP/Irinotecan -- s/p cycle #4 -- start on 06/15/2020 Xgeva 120 mg subcutaneous Q3 months - due in 01/2021 Lupron 22 mg IM every 3 months - due in 01/2021  SBRT to sacral lesion  IV iron as indicated    Interim History:  [name] is here today for follow-up.  He does not feel all that well.  He feels tired.  He is short of breath.  I can understand this because his hemoglobin is 7.8.  He clearly needs to be transfused.  He and his wife are planning on going to [place] this weekend.  They will be gone for about 10 days.  I really want his blood to be better so that he will have less difficulties.  We are going to hold his treatment till he returns.    Otherwise, he is doing okay.  Has little bit of constipation.  He has had a little bit of a cough.  There is no hemoptysis.  He has had no fever.  There has been no rashes.  He has had no headache.  There is been no problems with leg swelling.  He does have the brace on the right lower leg.  Overall, I would have to say that his performance status right now is ECOG 1.     Medications:  Allergies as of 10/20/2020       Reactions   Codeine Nausea And Vomiting   Hydrocodone Nausea And Vomiting        Medication List        Accurate as of October 20, 2020  4:38 PM. If you have any questions, ask your nurse or doctor.          acetaminophen 500 MG tablet Commonly known as: TYLENOL Take 1 tablet (500 mg total) by mouth every 4 (four) hours as needed for mild pain, headache or fever (fever >/= 101).   Anti-Diarrheal 2 MG tablet Generic drug: loperamide Take 2 tablets by mouth at diarrhea onset, then 1 tablet every 2 hours until 12 hours with no bowel movment. May take 2 tablets every 4 hours at night. If diarrhea recurs repeat. What changed:  how much to take how to take this when to take this reasons to take this   ascorbic acid 500 MG tablet Commonly known as: VITAMIN C Take 1 tablet (500 mg total) by mouth daily.   aspirin 81 MG tablet Take 81 mg by mouth daily.   dexamethasone 4 MG tablet Commonly known as: DECADRON Take 4 mg by mouth daily. X 3 days   ezetimibe 10 MG tablet Commonly known as: ZETIA TAKE  1 TABLET BY MOUTH DAILY What changed: how much to take   Generlac 10 GM/15ML Soln Generic drug: lactulose (encephalopathy) Take 15 mLs (10 g total) by mouth daily as needed for constipation.   isosorbide mononitrate 30 MG 24 hr tablet Commonly known as: IMDUR TAKE 1 TABLET (30 MG TOTAL) BY MOUTH DAILY. What changed:  how much to take when to take this   lidocaine-prilocaine cream Commonly known as: EMLA Apply to affected area once What changed:  how much to take how to take this when to take this additional instructions   ondansetron 8 MG tablet Commonly known as: Zofran Take 1 tablet (8 mg total) by mouth 2 (two) times daily as needed. Start on the third day after cisplatin chemotherapy. What changed: reasons to take this   OVER THE COUNTER MEDICATION 1 each by Other route See admin instructions. Juice Plus --  8 capsule of each the garden, vineyard, and orchard twice a day.   pantoprazole 40 MG tablet Commonly known as: PROTONIX TAKE 1 TABLET (40 MG TOTAL) BY MOUTH AT BEDTIME. What changed: how much to take   prochlorperazine 10 MG tablet Commonly known as: COMPAZINE Take 1 tablet (10 mg total) by mouth every 6 (six) hours as needed for nausea or vomiting.   solifenacin 10 MG tablet Commonly known as: VESIcare Take 1 tablet (10 mg total) by mouth daily.   Zinc 50 MG Caps Take 50 mg by mouth daily.        Allergies:  Allergies  Allergen Reactions   Codeine Nausea And Vomiting   Hydrocodone Nausea And Vomiting    Past Medical History, Surgical history, Social history, and Family History were reviewed and updated.  Review of Systems: Review of Systems  Constitutional: Negative.   HENT: Negative.    Eyes: Negative.   Respiratory: Negative.    Cardiovascular: Negative.   Gastrointestinal: Negative.   Genitourinary: Negative.   Musculoskeletal:  Positive for joint pain.  Skin: Negative.   Neurological:  Positive for focal weakness.  Endo/Heme/Allergies: Negative.   Psychiatric/Behavioral: Negative.      Physical Exam:  height is 5\' 8"  (1.727 m) and weight is 159 lb 12.8 oz (72.5 kg). His oral temperature is 97.8 F (36.6 C). His blood pressure is 127/69 and his pulse is 95. His respiration is 18 and oxygen saturation is 100%.   Wt Readings from Last 3 Encounters:  10/20/20 159 lb 12.8 oz (72.5 kg)  10/19/20 159 lb (72.1 kg)  09/20/20 164 lb (74.4 kg)    Physical Exam Vitals reviewed.  HENT:     Head: Normocephalic and atraumatic.  Eyes:     Pupils: Pupils are equal, round, and reactive to light.  Cardiovascular:     Rate and Rhythm: Normal rate and regular rhythm.     Heart sounds: Normal heart sounds.  Pulmonary:     Effort: Pulmonary effort is normal.     Breath sounds: Normal breath sounds.  Abdominal:     General: Bowel sounds are normal.     Palpations:  Abdomen is soft.  Musculoskeletal:        General: No tenderness or deformity. Normal range of motion.     Cervical back: Normal range of motion.  Lymphadenopathy:     Cervical: No cervical adenopathy.  Skin:    General: Skin is warm and dry.     Findings: No erythema or rash.  Neurological:     Mental Status: He is alert and oriented to person,  place, and time.  Psychiatric:        Behavior: Behavior normal.        Thought Content: Thought content normal.        Judgment: Judgment normal.   Lab Results  Component Value Date   WBC 3.2 (L) 10/20/2020   HGB 7.8 (L) 10/20/2020   HCT 24.2 (L) 10/20/2020   MCV 107.1 (H) 10/20/2020   PLT 259 10/20/2020   Lab Results  Component Value Date   FERRITIN 570 (H) 09/20/2020   IRON 100 09/20/2020   TIBC 266 09/20/2020   UIBC 166 09/20/2020   IRONPCTSAT 38 09/20/2020   Lab Results  Component Value Date   RETICCTPCT 2.6 09/20/2020   RBC 2.26 (L) 10/20/2020   No results found for: KPAFRELGTCHN, LAMBDASER, KAPLAMBRATIO No results found for: IGGSERUM, IGA, IGMSERUM No results found for: Kathrynn Ducking, MSPIKE, SPEI   Chemistry      Component Value Date/Time   NA 137 10/20/2020 0821   NA 139 10/19/2020 1444   NA 144 12/14/2016 1159   NA 140 10/22/2016 1129   K 4.0 10/20/2020 0821   K 3.5 12/14/2016 1159   K 4.3 10/22/2016 1129   CL 103 10/20/2020 0821   CL 103 12/14/2016 1159   CO2 25 10/20/2020 0821   CO2 30 12/14/2016 1159   CO2 27 10/22/2016 1129   BUN 20 10/20/2020 0821   BUN 17 10/19/2020 1444   BUN 16 12/14/2016 1159   BUN 14.0 10/22/2016 1129   CREATININE 1.44 (H) 10/20/2020 0821   CREATININE 1.3 (H) 12/14/2016 1159   CREATININE 1.1 10/22/2016 1129      Component Value Date/Time   CALCIUM 9.3 10/20/2020 0821   CALCIUM 10.0 12/14/2016 1159   CALCIUM 10.4 10/22/2016 1129   ALKPHOS 66 10/20/2020 0821   ALKPHOS 48 12/14/2016 1159   ALKPHOS 60 10/22/2016 1129   AST 12 (L)  10/20/2020 0821   AST 16 10/22/2016 1129   ALT 12 10/20/2020 0821   ALT 23 12/14/2016 1159   ALT 14 10/22/2016 1129   BILITOT 0.2 (L) 10/20/2020 0821   BILITOT 0.31 10/22/2016 1129       Impression and Plan: Mr. Sargeant is 75 yo caucasian gentleman with metastatic neuroendocrine carcinoma of unknown primary.    For right now, we will give him a transfusion.  We will transfuse him today.  Then, he will be ready to go to [place].  I am sure that he is wife will have a wonderful time in [place].  We will plan to get him back to start treatment for his fifth cycle.   Everything really is focused on his quality of life.  I am just happy that he and his wife will have a wonderful time down in [place].   10/6/20224:38 PM

## 2020-10-20 NOTE — Patient Instructions (Signed)
Tunneled Central Venous Catheter Flushing Guide It is important to flush your tunneled central venous catheter each time you use it, both before and after you use it. Flushing your catheter will help prevent it from clogging. What are the risks? Risks may include: Infection. Air getting into the catheter and bloodstream. Supplies needed: A clean pair of gloves. A disinfecting wipe. Use an alcohol wipe, chlorhexidine wipe, or iodine wipe as told by your health care provider. A 10 mL syringe that has been prefilled with saline solution. An empty 10 mL syringe, if a substance called heparin was injected into your catheter. How to flush your catheter When you flush your catheter, make sure you follow any specific instructions from your health care provider or the manufacturer. These are general guidelines. Flushing your catheter before use If there is heparin in your catheter: Wash your hands with soap and water. Put on gloves. Scrub the injection cap for a minimum of 15 seconds with a disinfecting wipe. Unclamp the catheter. Attach the empty syringe to the injection cap. Pull the syringe plunger back and withdraw 10 mL of blood. Place the syringe into an appropriate waste container. Scrub the injection cap for 15 seconds with a disinfecting wipe. Attach the prefilled syringe to the injection cap. Flush the catheter by pushing the plunger forward until all the liquid from the syringe is in the catheter. Remove the syringe from the injection cap. Clamp the catheter. If there is no heparin in your catheter: Wash your hands with soap and water. Put on gloves. Scrub the injection cap for 15 seconds with a disinfecting wipe. Unclamp the catheter. Attach the prefilled syringe to the injection cap. Flush the catheter by pushing the plunger forward until 5 mL of the liquid from the syringe is in the catheter. Pull back on the syringe until you see blood in the catheter. If you have been asked  to collect any blood, follow your health care provider's instructions. Otherwise, flush the catheter with the rest of the solution from the syringe. Remove the syringe from the injection cap. Clamp the catheter.  Flushing your catheter after use Wash your hands with soap and water. Put on gloves. Scrub the injection cap for 15 seconds with a disinfecting wipe. Unclamp the catheter. Attach the prefilled syringe to the injection cap. Flush the catheter by pushing the plunger forward until all of the liquid from the syringe is in the catheter. Remove the syringe from the injection cap. Clamp the catheter. Problems and solutions If blood cannot be completely cleared from the injection cap, you may need to have the injection cap replaced. If the catheter is difficult to flush, use the pulsing method. The pulsing method involves pushing only a few milliliters of solution into the catheter at a time and pausing between pushes. If you do not see blood in the catheter when you pull back on the syringe, change your body position, such as by raising your arms above your head. Take a deep breath and cough. Then, pull back on the syringe. If you still do not see blood, flush the catheter with a small amount of solution. Then, change positions again and take a breath or cough. Pull back on the syringe again. If you still do not see blood, finish flushing the catheter and contact your health care provider. Do not use your catheter until your health care provider says it is okay. General tips Have someone help you flush your catheter, if possible. Do not force fluid   through your catheter. Do not use a syringe that is larger or smaller than 10 mL. Using a smaller syringe can make the catheter burst. Do not use your catheter without flushing it first if it has heparin in it. Contact a health care provider if: You cannot see any blood in the catheter when you flush it before using it. Your catheter is difficult  to flush. Get help right away if: You cannot flush the catheter. The catheter leaks when you flush it or when there is fluid in it. There are cracks or breaks in the catheter. Summary It is important to flush your tunneled central venous catheter each time you use it, both before and after you use it. Scrub the injection cap for 15 seconds with a disinfecting wipe before and after you flush it. When you flush your catheter, make sure you follow any specific instructions from your health care provider or the manufacturer. Get help right away if you cannot flush the catheter. This information is not intended to replace advice given to you by your health care provider. Make sure you discuss any questions you have with your health care provider. Document Revised: 03/12/2019 Document Reviewed: 03/19/2018 Elsevier Patient Education  2022 Elsevier Inc.  

## 2020-10-20 NOTE — Progress Notes (Signed)
[facility] Per Dr. Marin Olp, patient typed and x-matched for 2 units of p-rbc's today d/t hgb of  7.8

## 2020-10-20 NOTE — Telephone Encounter (Signed)
Per secure chat Vanessa (2) units of blood

## 2020-10-20 NOTE — Patient Instructions (Signed)
Blood Transfusion, Adult, Care After This sheet gives you information about how to care for yourself after your procedure. Your doctor may also give you more specific instructions. If you have problems or questions, contact your doctor. What can I expect after the procedure? After the procedure, it is common to have: Bruising and soreness at the IV site. A headache. Follow these instructions at home: Insertion site care   Follow instructions from your doctor about how to take care of your insertion site. This is where an IV tube was put into your vein. Make sure you: Wash your hands with soap and water before and after you change your bandage (dressing). If you cannot use soap and water, use hand sanitizer. Change your bandage as told by your doctor. Check your insertion site every day for signs of infection. Check for: Redness, swelling, or pain. Bleeding from the site. Warmth. Pus or a bad smell. General instructions Take over-the-counter and prescription medicines only as told by your doctor. Rest as told by your doctor. Go back to your normal activities as told by your doctor. Keep all follow-up visits as told by your doctor. This is important. Contact a doctor if: You have itching or red, swollen areas of skin (hives). You feel worried or nervous (anxious). You feel weak after doing your normal activities. You have redness, swelling, warmth, or pain around the insertion site. You have blood coming from the insertion site, and the blood does not stop with pressure. You have pus or a bad smell coming from the insertion site. Get help right away if: You have signs of a serious reaction. This may be coming from an allergy or the body's defense system (immune system). Signs include: Trouble breathing or shortness of breath. Swelling of the face or feeling warm (flushed). Fever or chills. Head, chest, or back pain. Dark pee (urine) or blood in the pee. Widespread rash. Fast  heartbeat. Feeling dizzy or light-headed. You may receive your blood transfusion in an outpatient setting. If so, you will be told whom to contact to report any reactions. These symptoms may be an emergency. Do not wait to see if the symptoms will go away. Get medical help right away. Call your local emergency services (911 in the [place] Do not drive yourself to the hospital. Summary Bruising and soreness at the IV site are common. Check your insertion site every day for signs of infection. Rest as told by your doctor. Go back to your normal activities as told by your doctor. Get help right away if you have signs of a serious reaction. This information is not intended to replace advice given to you by your health care provider. Make sure you discuss any questions you have with your health care provider. Document Revised: 04/28/2020 Document Reviewed: 06/26/2018 Elsevier Patient Education  2022 Elsevier Inc.  

## 2020-10-21 ENCOUNTER — Telehealth: Payer: Self-pay | Admitting: *Deleted

## 2020-10-21 LAB — TYPE AND SCREEN
ABO/RH(D): O POS
Antibody Screen: NEGATIVE
Unit division: 0
Unit division: 0

## 2020-10-21 LAB — BPAM RBC
Blood Product Expiration Date: 202211042359
Blood Product Expiration Date: 202211042359
ISSUE DATE / TIME: 202210061103
ISSUE DATE / TIME: 202210061103
Unit Type and Rh: 5100
Unit Type and Rh: 5100

## 2020-10-21 NOTE — Telephone Encounter (Signed)
Per 10/21/20 los - gave upcoming appointments - confirmed

## 2020-11-02 ENCOUNTER — Other Ambulatory Visit: Payer: Medicare Other

## 2020-11-02 ENCOUNTER — Ambulatory Visit: Payer: Medicare Other | Admitting: Hematology & Oncology

## 2020-11-03 ENCOUNTER — Other Ambulatory Visit (HOSPITAL_BASED_OUTPATIENT_CLINIC_OR_DEPARTMENT_OTHER): Payer: Self-pay

## 2020-11-03 ENCOUNTER — Inpatient Hospital Stay: Payer: Medicare Other

## 2020-11-03 ENCOUNTER — Encounter: Payer: Self-pay | Admitting: Hematology & Oncology

## 2020-11-03 ENCOUNTER — Other Ambulatory Visit: Payer: Self-pay

## 2020-11-03 ENCOUNTER — Telehealth: Payer: Self-pay | Admitting: *Deleted

## 2020-11-03 ENCOUNTER — Inpatient Hospital Stay (HOSPITAL_BASED_OUTPATIENT_CLINIC_OR_DEPARTMENT_OTHER): Payer: Medicare Other | Admitting: Hematology & Oncology

## 2020-11-03 VITALS — BP 127/75 | HR 100 | Temp 98.3°F | Resp 17 | Wt 157.0 lb

## 2020-11-03 DIAGNOSIS — C7A8 Other malignant neuroendocrine tumors: Secondary | ICD-10-CM | POA: Diagnosis not present

## 2020-11-03 DIAGNOSIS — R0602 Shortness of breath: Secondary | ICD-10-CM | POA: Diagnosis not present

## 2020-11-03 DIAGNOSIS — K909 Intestinal malabsorption, unspecified: Secondary | ICD-10-CM | POA: Diagnosis not present

## 2020-11-03 DIAGNOSIS — C61 Malignant neoplasm of prostate: Secondary | ICD-10-CM | POA: Diagnosis not present

## 2020-11-03 DIAGNOSIS — M2550 Pain in unspecified joint: Secondary | ICD-10-CM | POA: Diagnosis not present

## 2020-11-03 DIAGNOSIS — C7951 Secondary malignant neoplasm of bone: Secondary | ICD-10-CM

## 2020-11-03 DIAGNOSIS — D508 Other iron deficiency anemias: Secondary | ICD-10-CM | POA: Diagnosis not present

## 2020-11-03 DIAGNOSIS — Z5111 Encounter for antineoplastic chemotherapy: Secondary | ICD-10-CM | POA: Diagnosis not present

## 2020-11-03 LAB — CBC WITH DIFFERENTIAL (CANCER CENTER ONLY)
Abs Immature Granulocytes: 0.16 10*3/uL — ABNORMAL HIGH (ref 0.00–0.07)
Basophils Absolute: 0 10*3/uL (ref 0.0–0.1)
Basophils Relative: 0 %
Eosinophils Absolute: 0 10*3/uL (ref 0.0–0.5)
Eosinophils Relative: 1 %
HCT: 34.5 % — ABNORMAL LOW (ref 39.0–52.0)
Hemoglobin: 11.6 g/dL — ABNORMAL LOW (ref 13.0–17.0)
Immature Granulocytes: 2 %
Lymphocytes Relative: 14 %
Lymphs Abs: 1.2 10*3/uL (ref 0.7–4.0)
MCH: 34.2 pg — ABNORMAL HIGH (ref 26.0–34.0)
MCHC: 33.6 g/dL (ref 30.0–36.0)
MCV: 101.8 fL — ABNORMAL HIGH (ref 80.0–100.0)
Monocytes Absolute: 0.9 10*3/uL (ref 0.1–1.0)
Monocytes Relative: 11 %
Neutro Abs: 6.2 10*3/uL (ref 1.7–7.7)
Neutrophils Relative %: 72 %
Platelet Count: 298 10*3/uL (ref 150–400)
RBC: 3.39 MIL/uL — ABNORMAL LOW (ref 4.22–5.81)
RDW: 15.3 % (ref 11.5–15.5)
WBC Count: 8.6 10*3/uL (ref 4.0–10.5)
nRBC: 0 % (ref 0.0–0.2)

## 2020-11-03 LAB — CMP (CANCER CENTER ONLY)
ALT: 12 U/L (ref 0–44)
AST: 14 U/L — ABNORMAL LOW (ref 15–41)
Albumin: 4.2 g/dL (ref 3.5–5.0)
Alkaline Phosphatase: 64 U/L (ref 38–126)
Anion gap: 11 (ref 5–15)
BUN: 21 mg/dL (ref 8–23)
CO2: 25 mmol/L (ref 22–32)
Calcium: 10 mg/dL (ref 8.9–10.3)
Chloride: 102 mmol/L (ref 98–111)
Creatinine: 1.47 mg/dL — ABNORMAL HIGH (ref 0.61–1.24)
GFR, Estimated: 50 mL/min — ABNORMAL LOW (ref >60)
Glucose, Bld: 107 mg/dL — ABNORMAL HIGH (ref 70–99)
Potassium: 3.8 mmol/L (ref 3.5–5.1)
Sodium: 138 mmol/L (ref 135–145)
Total Bilirubin: 0.3 mg/dL (ref 0.3–1.2)
Total Protein: 6.8 g/dL (ref 6.5–8.1)

## 2020-11-03 LAB — RETICULOCYTES
Immature Retic Fract: 14.3 % (ref 2.3–15.9)
RBC.: 3.39 MIL/uL — ABNORMAL LOW (ref 4.22–5.81)
Retic Count, Absolute: 29.8 10*3/uL (ref 19.0–186.0)
Retic Ct Pct: 0.9 % (ref 0.4–3.1)

## 2020-11-03 LAB — LACTATE DEHYDROGENASE: LDH: 176 U/L (ref 98–192)

## 2020-11-03 LAB — SAMPLE TO BLOOD BANK

## 2020-11-03 LAB — MAGNESIUM: Magnesium: 2.1 mg/dL (ref 1.7–2.4)

## 2020-11-03 LAB — FERRITIN: Ferritin: 806 ng/mL — ABNORMAL HIGH (ref 24–336)

## 2020-11-03 MED ORDER — PALONOSETRON HCL INJECTION 0.25 MG/5ML
0.2500 mg | Freq: Once | INTRAVENOUS | Status: AC
  Administered 2020-11-03: 0.25 mg via INTRAVENOUS
  Filled 2020-11-03: qty 5

## 2020-11-03 MED ORDER — SODIUM CHLORIDE 0.9 % IV SOLN
150.0000 mg | Freq: Once | INTRAVENOUS | Status: AC
  Administered 2020-11-03: 150 mg via INTRAVENOUS
  Filled 2020-11-03: qty 150

## 2020-11-03 MED ORDER — POTASSIUM CHLORIDE IN NACL 20-0.9 MEQ/L-% IV SOLN
Freq: Once | INTRAVENOUS | Status: AC
  Filled 2020-11-03: qty 1000

## 2020-11-03 MED ORDER — ATROPINE SULFATE 1 MG/ML IV SOLN
0.5000 mg | Freq: Once | INTRAVENOUS | Status: AC | PRN
  Administered 2020-11-03: 0.5 mg via INTRAVENOUS
  Filled 2020-11-03: qty 1

## 2020-11-03 MED ORDER — SODIUM CHLORIDE 0.9% FLUSH: 3.0000 mL | INTRAVENOUS | Status: DC | PRN

## 2020-11-03 MED ORDER — SODIUM CHLORIDE 0.9% FLUSH
10.0000 mL | INTRAVENOUS | Status: DC | PRN
  Administered 2020-11-03: 10 mL

## 2020-11-03 MED ORDER — ALTEPLASE 2 MG IJ SOLR: 2.0000 mg | Freq: Once | INTRAMUSCULAR | Status: DC | PRN

## 2020-11-03 MED ORDER — MAGNESIUM SULFATE 2 GM/50ML IV SOLN
2.0000 g | Freq: Once | INTRAVENOUS | Status: AC
  Administered 2020-11-03: 2 g via INTRAVENOUS
  Filled 2020-11-03: qty 50

## 2020-11-03 MED ORDER — SODIUM CHLORIDE 0.9 % IV SOLN: Freq: Once | INTRAVENOUS | Status: AC

## 2020-11-03 MED ORDER — HEPARIN SOD (PORK) LOCK FLUSH 100 UNIT/ML IV SOLN
250.0000 [IU] | Freq: Once | INTRAVENOUS | Status: DC | PRN
Start: 2020-11-03 — End: 2020-11-03

## 2020-11-03 MED ORDER — SODIUM CHLORIDE 0.9 % IV SOLN
30.0000 mg/m2 | Freq: Once | INTRAVENOUS | Status: AC
  Administered 2020-11-03: 55 mg via INTRAVENOUS
  Filled 2020-11-03: qty 55

## 2020-11-03 MED ORDER — SODIUM CHLORIDE 0.9 % IV SOLN
10.0000 mg | Freq: Once | INTRAVENOUS | Status: AC
  Administered 2020-11-03: 10 mg via INTRAVENOUS
  Filled 2020-11-03: qty 10

## 2020-11-03 MED ORDER — HEPARIN SOD (PORK) LOCK FLUSH 100 UNIT/ML IV SOLN
500.0000 [IU] | Freq: Once | INTRAVENOUS | Status: AC | PRN
  Administered 2020-11-03: 500 [IU]

## 2020-11-03 MED ORDER — SODIUM CHLORIDE 0.9 % IV SOLN
65.0000 mg/m2 | Freq: Once | INTRAVENOUS | Status: AC
  Administered 2020-11-03: 120 mg via INTRAVENOUS
  Filled 2020-11-03: qty 6

## 2020-11-03 NOTE — Patient Instructions (Signed)
[facility]  Discharge Instructions: Thank you for choosing [facility] to provide your oncology and hematology care.   If you have a lab appointment with the [facility], please go directly to the [facility] and check in at the registration area.  Wear comfortable clothing and clothing appropriate for easy access to any Portacath or PICC line.   We strive to give you quality time with your provider. You may need to reschedule your appointment if you arrive late (15 or more minutes).  Arriving late affects you and other patients whose appointments are after yours.  Also, if you miss three or more appointments without notifying the office, you may be dismissed from the clinic at the provider's discretion.      For prescription refill requests, have your pharmacy contact our office and allow 72 hours for refills to be completed.    Today you received the following chemotherapy and/or immunotherapy agents Camptosar, Cisplatin      To help prevent nausea and vomiting after your treatment, we encourage you to take your nausea medication as directed.  BELOW ARE SYMPTOMS THAT SHOULD BE REPORTED IMMEDIATELY: *FEVER GREATER THAN 100.4 F (38 C) OR HIGHER *CHILLS OR SWEATING *NAUSEA AND VOMITING THAT IS NOT CONTROLLED WITH YOUR NAUSEA MEDICATION *UNUSUAL SHORTNESS OF BREATH *UNUSUAL BRUISING OR BLEEDING *URINARY PROBLEMS (pain or burning when urinating, or frequent urination) *BOWEL PROBLEMS (unusual diarrhea, constipation, pain near the anus) TENDERNESS IN MOUTH AND THROAT WITH OR WITHOUT PRESENCE OF ULCERS (sore throat, sores in mouth, or a toothache) UNUSUAL RASH, SWELLING OR PAIN  UNUSUAL VAGINAL DISCHARGE OR ITCHING   Items with * indicate a potential emergency and should be followed up as soon as possible or go to the Emergency Department if any problems should occur.  Please show the CHEMOTHERAPY ALERT CARD or IMMUNOTHERAPY ALERT CARD at  check-in to the Emergency Department and triage nurse. Should you have questions after your visit or need to cancel or reschedule your appointment, please contact [facility]  [phone] and follow the prompts.  Office hours are 8:00 a.m. to 4:30 p.m. Monday - Friday. Please note that voicemails left after 4:00 p.m. may not be returned until the following business day.  We are closed weekends and major holidays. You have access to a nurse at all times for urgent questions. Please call the main number to the clinic [phone] and follow the prompts.  For any non-urgent questions, you may also contact your provider using MyChart. We now offer e-Visits for anyone 23 and older to request care online for non-urgent symptoms. For details visit [email or web address].   Also download the MyChart app! Go to the app store, search "MyChart", open the app, select [facility], and log in with your MyChart username and password.  Due to Covid, a mask is required upon entering the hospital/clinic. If you do not have a mask, one will be given to you upon arrival. For doctor visits, patients may have 1 support person aged 42 or older with them. For treatment visits, patients cannot have anyone with them due to current Covid guidelines and our immunocompromised population.

## 2020-11-03 NOTE — Telephone Encounter (Signed)
Per 11/03/20 los gave upcoming appointment - confirmed - print calendar

## 2020-11-03 NOTE — Patient Instructions (Signed)
Implanted Port Home Guide An implanted port is a device that is placed under the skin. It is usually placed in the chest. The device can be used to give IV medicine, to take blood, or for dialysis. You may have an implanted port if: You need IV medicine that would be irritating to the small veins in your hands or arms. You need IV medicines, such as antibiotics, for a long period of time. You need IV nutrition for a long period of time. You need dialysis. When you have a port, your health care provider can choose to use the port instead of veins in your arms for these procedures. You may have fewer limitations when using a port than you would if you used other types of long-term IVs, and you will likely be able to return to normal activities after your incision heals. An implanted port has two main parts: Reservoir. The reservoir is the part where a needle is inserted to give medicines or draw blood. The reservoir is round. After it is placed, it appears as a small, raised area under your skin. Catheter. The catheter is a thin, flexible tube that connects the reservoir to a vein. Medicine that is inserted into the reservoir goes into the catheter and then into the vein. How is my port accessed? To access your port: A numbing cream may be placed on the skin over the port site. Your health care provider will put on a mask and sterile gloves. The skin over your port will be cleaned carefully with a germ-killing soap and allowed to dry. Your health care provider will gently pinch the port and insert a needle into it. Your health care provider will check for a blood return to make sure the port is in the vein and is not clogged. If your port needs to remain accessed to get medicine continuously (constant infusion), your health care provider will place a clear bandage (dressing) over the needle site. The dressing and needle will need to be changed every week, or as told by your health care provider. What  is flushing? Flushing helps keep the port from getting clogged. Follow instructions from your health care provider about how and when to flush the port. Ports are usually flushed with saline solution or a medicine called heparin. The need for flushing will depend on how the port is used: If the port is only used from time to time to give medicines or draw blood, the port may need to be flushed: Before and after medicines have been given. Before and after blood has been drawn. As part of routine maintenance. Flushing may be recommended every 4-6 weeks. If a constant infusion is running, the port may not need to be flushed. Throw away any syringes in a disposal container that is meant for sharp items (sharps container). You can buy a sharps container from a pharmacy, or you can make one by using an empty hard plastic bottle with a cover. How long will my port stay implanted? The port can stay in for as long as your health care provider thinks it is needed. When it is time for the port to come out, a surgery will be done to remove it. The surgery will be similar to the procedure that was done to put the port in. Follow these instructions at home:  Flush your port as told by your health care provider. If you need an infusion over several days, follow instructions from your health care provider about how   to take care of your port site. Make sure you: Wash your hands with soap and water before you change your dressing. If soap and water are not available, use alcohol-based hand sanitizer. Change your dressing as told by your health care provider. Place any used dressings or infusion bags into a plastic bag. Throw that bag in the trash. Keep the dressing that covers the needle clean and dry. Do not get it wet. Do not use scissors or sharp objects near the tube. Keep the tube clamped, unless it is being used. Check your port site every day for signs of infection. Check for: Redness, swelling, or  pain. Fluid or blood. Pus or a bad smell. Protect the skin around the port site. Avoid wearing bra straps that rub or irritate the site. Protect the skin around your port from seat belts. Place a soft pad over your chest if needed. Bathe or shower as told by your health care provider. The site may get wet as long as you are not actively receiving an infusion. Return to your normal activities as told by your health care provider. Ask your health care provider what activities are safe for you. Carry a medical alert card or wear a medical alert bracelet at all times. This will let health care providers know that you have an implanted port in case of an emergency. Get help right away if: You have redness, swelling, or pain at the port site. You have fluid or blood coming from your port site. You have pus or a bad smell coming from the port site. You have a fever. Summary Implanted ports are usually placed in the chest for long-term IV access. Follow instructions from your health care provider about flushing the port and changing bandages (dressings). Take care of the area around your port by avoiding clothing that puts pressure on the area, and by watching for signs of infection. Protect the skin around your port from seat belts. Place a soft pad over your chest if needed. Get help right away if you have a fever or you have redness, swelling, pain, drainage, or a bad smell at the port site. This information is not intended to replace advice given to you by your health care provider. Make sure you discuss any questions you have with your health care provider. Document Revised: 03/23/2020 Document Reviewed: 05/18/2019 Elsevier Patient Education  2022 Elsevier Inc.  

## 2020-11-03 NOTE — Progress Notes (Signed)
Hematology and Oncology Follow Up Visit  [name] [id number] Aug 17, 1945 75 y.o. 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 def anemia -- malabsorption   Past Therapy: Zytiga 1000 mg by mouth daily - discontinued on 08/15/2016 Xtandi 160 mg po q day - start on 08/15/2016 - discontinued Radium-223 therapy -- s/p cycle #6 Palliative radiation to the right sacrum Lutathera - s/p cycle 4/4 --last dose on 07/11/2017 Carbo/VP-16/Tecentriq --  S/p cycle #5 Keytruda q 6 week -- Maintanence -- s/p cycle #3 - changed from 3 to 6 week on 06/30/2018 Taxotere/Keytruda -- start on 09/17/2018 -- s/p cycle #10 -- d/c due to progression   Current Therapy:        Lurbinectedin -- started on 04/29/2019, s/p cycle 17 -- d/c on 05/2020 due to progression CDDP/Irinotecan -- s/p cycle #4 -- start on 06/15/2020 Xgeva 120 mg subcutaneous Q3 months - due in 01/2021 Lupron 22 mg IM every 3 months - due in 01/2021  SBRT to sacral lesion  IV iron as indicated    Interim History:  [name] is here today for follow-up.  He and his wife a wonderful time down in [place].  They enjoy themselves.  The weather was perfect for them.  He really had no problems while he was down there.  Before he went down, we did give him a blood transfusion.  This really did help some.  He has a little bit of pain in the right sacrum.  I know he has had some cancer back that way.  This comes and goes.  He said he had a the night before last.  His last chromogranin A level back in September was 1145.  He has had little bit of a cough.  He has had some urinary frequency.  We have checked his urine on multiple occasions.  He has had a little bit constipation but had a little bit of diarrhea down in [place].  He has had no fever.  He has had no headache.  He has had no mouth sores.  Overall, I would say his pulm  status is probably ECOG 1.    Medications:  Allergies as of 11/03/2020       Reactions   Codeine Nausea And Vomiting   Hydrocodone Nausea And Vomiting        Medication List        Accurate as of November 03, 2020  9:06 AM. If you have any questions, ask your nurse or doctor.          acetaminophen 500 MG tablet Commonly known as: TYLENOL Take 1 tablet (500 mg total) by mouth every 4 (four) hours as needed for mild pain, headache or fever (fever >/= 101).   Anti-Diarrheal 2 MG tablet Generic drug: loperamide Take 2 tablets by mouth at diarrhea onset, then 1 tablet every 2 hours until 12 hours with no bowel movment. May take 2 tablets every 4 hours at night. If diarrhea recurs repeat. What changed:  how much to take how to take this when to take this reasons to take this   ascorbic acid 500 MG tablet Commonly known as: VITAMIN C Take 1 tablet (500 mg total) by mouth daily.   aspirin 81 MG tablet Take 81 mg by mouth daily.   dexamethasone 4 MG tablet Commonly known as: DECADRON Take 4 mg by mouth daily. X 3 days   ezetimibe 10  MG tablet Commonly known as: ZETIA TAKE 1 TABLET BY MOUTH DAILY What changed: how much to take   Generlac 10 GM/15ML Soln Generic drug: lactulose (encephalopathy) Take 15 mLs (10 g total) by mouth daily as needed for constipation.   isosorbide mononitrate 30 MG 24 hr tablet Commonly known as: IMDUR TAKE 1 TABLET (30 MG TOTAL) BY MOUTH DAILY. What changed:  how much to take when to take this   lidocaine-prilocaine cream Commonly known as: EMLA Apply to affected area once What changed:  how much to take how to take this when to take this additional instructions   ondansetron 8 MG tablet Commonly known as: Zofran Take 1 tablet (8 mg total) by mouth 2 (two) times daily as needed. Start on the third day after cisplatin chemotherapy. What changed: reasons to take this   OVER THE COUNTER MEDICATION 1 each by Other route See  admin instructions. Juice Plus -- 8 capsule of each the garden, vineyard, and orchard twice a day.   pantoprazole 40 MG tablet Commonly known as: PROTONIX TAKE 1 TABLET (40 MG TOTAL) BY MOUTH AT BEDTIME. What changed: how much to take   prochlorperazine 10 MG tablet Commonly known as: COMPAZINE Take 1 tablet (10 mg total) by mouth every 6 (six) hours as needed for nausea or vomiting.   solifenacin 10 MG tablet Commonly known as: VESIcare Take 1 tablet (10 mg total) by mouth daily.   Zinc 50 MG Caps Take 50 mg by mouth daily.        Allergies:  Allergies  Allergen Reactions   Codeine Nausea And Vomiting   Hydrocodone Nausea And Vomiting    Past Medical History, Surgical history, Social history, and Family History were reviewed and updated.  Review of Systems: Review of Systems  Constitutional: Negative.   HENT: Negative.    Eyes: Negative.   Respiratory: Negative.    Cardiovascular: Negative.   Gastrointestinal: Negative.   Genitourinary: Negative.   Musculoskeletal:  Positive for joint pain.  Skin: Negative.   Neurological:  Positive for focal weakness.  Endo/Heme/Allergies: Negative.   Psychiatric/Behavioral: Negative.      Physical Exam:  weight is 157 lb (71.2 kg). His oral temperature is 98.3 F (36.8 C). His blood pressure is 127/75 and his pulse is 100. His respiration is 17 and oxygen saturation is 99%.   Wt Readings from Last 3 Encounters:  11/03/20 157 lb (71.2 kg)  10/20/20 159 lb 12.8 oz (72.5 kg)  10/19/20 159 lb (72.1 kg)    Physical Exam Vitals reviewed.  HENT:     Head: Normocephalic and atraumatic.  Eyes:     Pupils: Pupils are equal, round, and reactive to light.  Cardiovascular:     Rate and Rhythm: Normal rate and regular rhythm.     Heart sounds: Normal heart sounds.  Pulmonary:     Effort: Pulmonary effort is normal.     Breath sounds: Normal breath sounds.  Abdominal:     General: Bowel sounds are normal.     Palpations:  Abdomen is soft.  Musculoskeletal:        General: No tenderness or deformity. Normal range of motion.     Cervical back: Normal range of motion.  Lymphadenopathy:     Cervical: No cervical adenopathy.  Skin:    General: Skin is warm and dry.     Findings: No erythema or rash.  Neurological:     Mental Status: He is alert and oriented to person, place, and  time.  Psychiatric:        Behavior: Behavior normal.        Thought Content: Thought content normal.        Judgment: Judgment normal.   Lab Results  Component Value Date   WBC 8.6 11/03/2020   HGB 11.6 (L) 11/03/2020   HCT 34.5 (L) 11/03/2020   MCV 101.8 (H) 11/03/2020   PLT 298 11/03/2020   Lab Results  Component Value Date   FERRITIN 570 (H) 09/20/2020   IRON 100 09/20/2020   TIBC 266 09/20/2020   UIBC 166 09/20/2020   IRONPCTSAT 38 09/20/2020   Lab Results  Component Value Date   RETICCTPCT 0.9 11/03/2020   RBC 3.39 (L) 11/03/2020   RBC 3.39 (L) 11/03/2020   No results found for: KPAFRELGTCHN, LAMBDASER, KAPLAMBRATIO No results found for: IGGSERUM, IGA, IGMSERUM No results found for: Kathrynn Ducking, MSPIKE, SPEI   Chemistry      Component Value Date/Time   NA 137 10/20/2020 0821   NA 139 10/19/2020 1444   NA 144 12/14/2016 1159   NA 140 10/22/2016 1129   K 4.0 10/20/2020 0821   K 3.5 12/14/2016 1159   K 4.3 10/22/2016 1129   CL 103 10/20/2020 0821   CL 103 12/14/2016 1159   CO2 25 10/20/2020 0821   CO2 30 12/14/2016 1159   CO2 27 10/22/2016 1129   BUN 20 10/20/2020 0821   BUN 17 10/19/2020 1444   BUN 16 12/14/2016 1159   BUN 14.0 10/22/2016 1129   CREATININE 1.44 (H) 10/20/2020 0821   CREATININE 1.3 (H) 12/14/2016 1159   CREATININE 1.1 10/22/2016 1129      Component Value Date/Time   CALCIUM 9.3 10/20/2020 0821   CALCIUM 10.0 12/14/2016 1159   CALCIUM 10.4 10/22/2016 1129   ALKPHOS 66 10/20/2020 0821   ALKPHOS 48 12/14/2016 1159   ALKPHOS 60  10/22/2016 1129   AST 12 (L) 10/20/2020 0821   AST 16 10/22/2016 1129   ALT 12 10/20/2020 0821   ALT 23 12/14/2016 1159   ALT 14 10/22/2016 1129   BILITOT 0.2 (L) 10/20/2020 0821   BILITOT 0.31 10/22/2016 1129       Impression and Plan: [name] is 75 yo caucasian gentleman with metastatic neuroendocrine carcinoma of unknown primary.    We will go ahead and give him his fifth cycle of treatment.  I am just happy that his quality of life is doing well right now.  I forgot to mention that he show me a picture was first great grandchild.  He was born a month early but yet weighed 5 pounds.  It was a boy.  I am very happy for he and his wife.  We will plan for another follow-up in 1 month.    10/20/20229:06 AM

## 2020-11-04 DIAGNOSIS — M9904 Segmental and somatic dysfunction of sacral region: Secondary | ICD-10-CM | POA: Diagnosis not present

## 2020-11-04 DIAGNOSIS — M9903 Segmental and somatic dysfunction of lumbar region: Secondary | ICD-10-CM | POA: Diagnosis not present

## 2020-11-04 DIAGNOSIS — M9902 Segmental and somatic dysfunction of thoracic region: Secondary | ICD-10-CM | POA: Diagnosis not present

## 2020-11-04 DIAGNOSIS — M9901 Segmental and somatic dysfunction of cervical region: Secondary | ICD-10-CM | POA: Diagnosis not present

## 2020-11-04 LAB — IRON AND TIBC
Iron: 92 ug/dL (ref 45–182)
Saturation Ratios: 36 % (ref 17.9–39.5)
TIBC: 258 ug/dL (ref 250–450)
UIBC: 166 ug/dL

## 2020-11-09 ENCOUNTER — Other Ambulatory Visit: Payer: Self-pay | Admitting: Family

## 2020-11-09 DIAGNOSIS — C7951 Secondary malignant neoplasm of bone: Secondary | ICD-10-CM

## 2020-11-09 DIAGNOSIS — C7A8 Other malignant neuroendocrine tumors: Secondary | ICD-10-CM

## 2020-11-10 ENCOUNTER — Other Ambulatory Visit: Payer: Self-pay

## 2020-11-10 ENCOUNTER — Inpatient Hospital Stay: Payer: Medicare Other

## 2020-11-10 VITALS — BP 128/79 | HR 96

## 2020-11-10 DIAGNOSIS — K909 Intestinal malabsorption, unspecified: Secondary | ICD-10-CM | POA: Diagnosis not present

## 2020-11-10 DIAGNOSIS — C7951 Secondary malignant neoplasm of bone: Secondary | ICD-10-CM

## 2020-11-10 DIAGNOSIS — D508 Other iron deficiency anemias: Secondary | ICD-10-CM | POA: Diagnosis not present

## 2020-11-10 DIAGNOSIS — C7A8 Other malignant neuroendocrine tumors: Secondary | ICD-10-CM

## 2020-11-10 DIAGNOSIS — Z5111 Encounter for antineoplastic chemotherapy: Secondary | ICD-10-CM | POA: Diagnosis not present

## 2020-11-10 DIAGNOSIS — M2550 Pain in unspecified joint: Secondary | ICD-10-CM | POA: Diagnosis not present

## 2020-11-10 DIAGNOSIS — R0602 Shortness of breath: Secondary | ICD-10-CM | POA: Diagnosis not present

## 2020-11-10 DIAGNOSIS — C61 Malignant neoplasm of prostate: Secondary | ICD-10-CM | POA: Diagnosis not present

## 2020-11-10 LAB — CBC WITH DIFFERENTIAL (CANCER CENTER ONLY)
Abs Immature Granulocytes: 0.05 10*3/uL (ref 0.00–0.07)
Basophils Absolute: 0 10*3/uL (ref 0.0–0.1)
Basophils Relative: 0 %
Eosinophils Absolute: 0.1 10*3/uL (ref 0.0–0.5)
Eosinophils Relative: 1 %
HCT: 34.5 % — ABNORMAL LOW (ref 39.0–52.0)
Hemoglobin: 11.5 g/dL — ABNORMAL LOW (ref 13.0–17.0)
Immature Granulocytes: 1 %
Lymphocytes Relative: 15 %
Lymphs Abs: 1 10*3/uL (ref 0.7–4.0)
MCH: 34 pg (ref 26.0–34.0)
MCHC: 33.3 g/dL (ref 30.0–36.0)
MCV: 102.1 fL — ABNORMAL HIGH (ref 80.0–100.0)
Monocytes Absolute: 0.4 10*3/uL (ref 0.1–1.0)
Monocytes Relative: 7 %
Neutro Abs: 5 10*3/uL (ref 1.7–7.7)
Neutrophils Relative %: 76 %
Platelet Count: 260 10*3/uL (ref 150–400)
RBC: 3.38 MIL/uL — ABNORMAL LOW (ref 4.22–5.81)
RDW: 14.7 % (ref 11.5–15.5)
WBC Count: 6.5 10*3/uL (ref 4.0–10.5)
nRBC: 0 % (ref 0.0–0.2)

## 2020-11-10 LAB — CMP (CANCER CENTER ONLY)
ALT: 14 U/L (ref 0–44)
AST: 11 U/L — ABNORMAL LOW (ref 15–41)
Albumin: 4.1 g/dL (ref 3.5–5.0)
Alkaline Phosphatase: 65 U/L (ref 38–126)
Anion gap: 12 (ref 5–15)
BUN: 26 mg/dL — ABNORMAL HIGH (ref 8–23)
CO2: 23 mmol/L (ref 22–32)
Calcium: 10.4 mg/dL — ABNORMAL HIGH (ref 8.9–10.3)
Chloride: 102 mmol/L (ref 98–111)
Creatinine: 1.54 mg/dL — ABNORMAL HIGH (ref 0.61–1.24)
GFR, Estimated: 48 mL/min — ABNORMAL LOW (ref >60)
Glucose, Bld: 120 mg/dL — ABNORMAL HIGH (ref 70–99)
Potassium: 3.8 mmol/L (ref 3.5–5.1)
Sodium: 137 mmol/L (ref 135–145)
Total Bilirubin: 0.3 mg/dL (ref 0.3–1.2)
Total Protein: 6.6 g/dL (ref 6.5–8.1)

## 2020-11-10 MED ORDER — SODIUM CHLORIDE 0.9 % IV SOLN
30.0000 mg/m2 | Freq: Once | INTRAVENOUS | Status: AC
  Administered 2020-11-10: 55 mg via INTRAVENOUS
  Filled 2020-11-10: qty 55

## 2020-11-10 MED ORDER — SODIUM CHLORIDE 0.9% FLUSH
10.0000 mL | INTRAVENOUS | Status: DC | PRN
  Administered 2020-11-10: 10 mL

## 2020-11-10 MED ORDER — MAGNESIUM SULFATE 2 GM/50ML IV SOLN
2.0000 g | Freq: Once | INTRAVENOUS | Status: AC
  Administered 2020-11-10: 2 g via INTRAVENOUS
  Filled 2020-11-10: qty 50

## 2020-11-10 MED ORDER — SODIUM CHLORIDE 0.9 % IV SOLN
10.0000 mg | Freq: Once | INTRAVENOUS | Status: AC
  Administered 2020-11-10: 10 mg via INTRAVENOUS
  Filled 2020-11-10: qty 10

## 2020-11-10 MED ORDER — ATROPINE SULFATE 1 MG/ML IV SOLN
0.5000 mg | Freq: Once | INTRAVENOUS | Status: AC | PRN
  Administered 2020-11-10: 0.5 mg via INTRAVENOUS
  Filled 2020-11-10: qty 1

## 2020-11-10 MED ORDER — POTASSIUM CHLORIDE IN NACL 20-0.9 MEQ/L-% IV SOLN
Freq: Once | INTRAVENOUS | Status: AC
  Filled 2020-11-10: qty 1000

## 2020-11-10 MED ORDER — SODIUM CHLORIDE 0.9 % IV SOLN
65.0000 mg/m2 | Freq: Once | INTRAVENOUS | Status: AC
  Administered 2020-11-10: 120 mg via INTRAVENOUS
  Filled 2020-11-10: qty 6

## 2020-11-10 MED ORDER — PALONOSETRON HCL INJECTION 0.25 MG/5ML
0.2500 mg | Freq: Once | INTRAVENOUS | Status: AC
  Administered 2020-11-10: 0.25 mg via INTRAVENOUS
  Filled 2020-11-10: qty 5

## 2020-11-10 MED ORDER — SODIUM CHLORIDE 0.9 % IV SOLN
150.0000 mg | Freq: Once | INTRAVENOUS | Status: AC
  Administered 2020-11-10: 150 mg via INTRAVENOUS
  Filled 2020-11-10: qty 150

## 2020-11-10 MED ORDER — HEPARIN SOD (PORK) LOCK FLUSH 100 UNIT/ML IV SOLN
500.0000 [IU] | Freq: Once | INTRAVENOUS | Status: AC | PRN
  Administered 2020-11-10: 500 [IU]

## 2020-11-10 MED ORDER — SODIUM CHLORIDE 0.9 % IV SOLN: Freq: Once | INTRAVENOUS | Status: AC

## 2020-11-10 NOTE — Progress Notes (Signed)
Okay to treat with SCr 1.54 per Lottie Dawson, NP.

## 2020-11-10 NOTE — Patient Instructions (Signed)
[facility]  Discharge Instructions: Thank you for choosing [facility] to provide your oncology and hematology care.   If you have a lab appointment with the [facility], please go directly to the [facility] and check in at the registration area.  Wear comfortable clothing and clothing appropriate for easy access to any Portacath or PICC line.   We strive to give you quality time with your provider. You may need to reschedule your appointment if you arrive late (15 or more minutes).  Arriving late affects you and other patients whose appointments are after yours.  Also, if you miss three or more appointments without notifying the office, you may be dismissed from the clinic at the provider's discretion.      For prescription refill requests, have your pharmacy contact our office and allow 72 hours for refills to be completed.    Today you received the following chemotherapy and/or immunotherapy agents Camptosar, Cisplatin      To help prevent nausea and vomiting after your treatment, we encourage you to take your nausea medication as directed.  BELOW ARE SYMPTOMS THAT SHOULD BE REPORTED IMMEDIATELY: *FEVER GREATER THAN 100.4 F (38 C) OR HIGHER *CHILLS OR SWEATING *NAUSEA AND VOMITING THAT IS NOT CONTROLLED WITH YOUR NAUSEA MEDICATION *UNUSUAL SHORTNESS OF BREATH *UNUSUAL BRUISING OR BLEEDING *URINARY PROBLEMS (pain or burning when urinating, or frequent urination) *BOWEL PROBLEMS (unusual diarrhea, constipation, pain near the anus) TENDERNESS IN MOUTH AND THROAT WITH OR WITHOUT PRESENCE OF ULCERS (sore throat, sores in mouth, or a toothache) UNUSUAL RASH, SWELLING OR PAIN  UNUSUAL VAGINAL DISCHARGE OR ITCHING   Items with * indicate a potential emergency and should be followed up as soon as possible or go to the Emergency Department if any problems should occur.  Please show the CHEMOTHERAPY ALERT CARD or IMMUNOTHERAPY ALERT CARD at  check-in to the Emergency Department and triage nurse. Should you have questions after your visit or need to cancel or reschedule your appointment, please contact [facility]  [phone] and follow the prompts.  Office hours are 8:00 a.m. to 4:30 p.m. Monday - Friday. Please note that voicemails left after 4:00 p.m. may not be returned until the following business day.  We are closed weekends and major holidays. You have access to a nurse at all times for urgent questions. Please call the main number to the clinic [phone] and follow the prompts.  For any non-urgent questions, you may also contact your provider using MyChart. We now offer e-Visits for anyone 87 and older to request care online for non-urgent symptoms. For details visit [email or web address].   Also download the MyChart app! Go to the app store, search "MyChart", open the app, select [facility], and log in with your MyChart username and password.  Due to Covid, a mask is required upon entering the hospital/clinic. If you do not have a mask, one will be given to you upon arrival. For doctor visits, patients may have 1 support person aged 38 or older with them. For treatment visits, patients cannot have anyone with them due to current Covid guidelines and our immunocompromised population.

## 2020-11-10 NOTE — Patient Instructions (Signed)

## 2020-11-16 ENCOUNTER — Ambulatory Visit (HOSPITAL_BASED_OUTPATIENT_CLINIC_OR_DEPARTMENT_OTHER)
Admission: RE | Admit: 2020-11-16 | Discharge: 2020-11-16 | Disposition: A | Discharge status: Home / Self Care | Payer: Medicare Other | Source: Ambulatory Visit | Attending: Cardiology | Admitting: Cardiology

## 2020-11-16 ENCOUNTER — Other Ambulatory Visit: Payer: Self-pay

## 2020-11-16 DIAGNOSIS — I10 Essential (primary) hypertension: Secondary | ICD-10-CM | POA: Insufficient documentation

## 2020-11-16 DIAGNOSIS — C61 Malignant neoplasm of prostate: Secondary | ICD-10-CM | POA: Diagnosis not present

## 2020-11-16 DIAGNOSIS — E119 Type 2 diabetes mellitus without complications: Secondary | ICD-10-CM | POA: Diagnosis not present

## 2020-11-16 DIAGNOSIS — C7A8 Other malignant neuroendocrine tumors: Secondary | ICD-10-CM | POA: Insufficient documentation

## 2020-11-16 DIAGNOSIS — R0609 Other forms of dyspnea: Secondary | ICD-10-CM | POA: Insufficient documentation

## 2020-11-16 LAB — ECHOCARDIOGRAM COMPLETE
AR max vel: 2.22 cm2
AV Area VTI: 2.26 cm2
AV Area mean vel: 2.51 cm2
AV Mean grad: 4 mmHg
AV Peak grad: 6.7 mmHg
Ao pk vel: 1.29 m/s
Area-P 1/2: 4.39 cm2
Calc EF: 54.3 %
S' Lateral: 1.7 cm
Single Plane A2C EF: 54.7 %
Single Plane A4C EF: 55.8 %

## 2020-11-28 DIAGNOSIS — M9902 Segmental and somatic dysfunction of thoracic region: Secondary | ICD-10-CM | POA: Diagnosis not present

## 2020-11-28 DIAGNOSIS — M9903 Segmental and somatic dysfunction of lumbar region: Secondary | ICD-10-CM | POA: Diagnosis not present

## 2020-11-28 DIAGNOSIS — M9901 Segmental and somatic dysfunction of cervical region: Secondary | ICD-10-CM | POA: Diagnosis not present

## 2020-11-28 DIAGNOSIS — M9904 Segmental and somatic dysfunction of sacral region: Secondary | ICD-10-CM | POA: Diagnosis not present

## 2020-11-29 ENCOUNTER — Other Ambulatory Visit: Payer: Self-pay | Admitting: Hematology & Oncology

## 2020-11-30 ENCOUNTER — Inpatient Hospital Stay: Payer: Medicare Other

## 2020-11-30 ENCOUNTER — Other Ambulatory Visit: Payer: Self-pay | Admitting: Hematology & Oncology

## 2020-11-30 ENCOUNTER — Other Ambulatory Visit: Payer: Self-pay

## 2020-11-30 ENCOUNTER — Other Ambulatory Visit (HOSPITAL_BASED_OUTPATIENT_CLINIC_OR_DEPARTMENT_OTHER): Payer: Self-pay

## 2020-11-30 ENCOUNTER — Encounter: Payer: Self-pay | Admitting: Family

## 2020-11-30 ENCOUNTER — Inpatient Hospital Stay: Payer: Medicare Other | Attending: Hematology & Oncology

## 2020-11-30 ENCOUNTER — Inpatient Hospital Stay (HOSPITAL_BASED_OUTPATIENT_CLINIC_OR_DEPARTMENT_OTHER): Payer: Medicare Other | Admitting: Family

## 2020-11-30 VITALS — BP 129/76 | HR 101 | Resp 17 | Ht 68.0 in | Wt 152.0 lb

## 2020-11-30 DIAGNOSIS — D631 Anemia in chronic kidney disease: Secondary | ICD-10-CM

## 2020-11-30 DIAGNOSIS — R5383 Other fatigue: Secondary | ICD-10-CM | POA: Insufficient documentation

## 2020-11-30 DIAGNOSIS — M7989 Other specified soft tissue disorders: Secondary | ICD-10-CM | POA: Diagnosis not present

## 2020-11-30 DIAGNOSIS — C7A8 Other malignant neuroendocrine tumors: Secondary | ICD-10-CM

## 2020-11-30 DIAGNOSIS — Z79899 Other long term (current) drug therapy: Secondary | ICD-10-CM | POA: Diagnosis not present

## 2020-11-30 DIAGNOSIS — D500 Iron deficiency anemia secondary to blood loss (chronic): Secondary | ICD-10-CM | POA: Diagnosis not present

## 2020-11-30 DIAGNOSIS — C7951 Secondary malignant neoplasm of bone: Secondary | ICD-10-CM

## 2020-11-30 DIAGNOSIS — D508 Other iron deficiency anemias: Secondary | ICD-10-CM | POA: Insufficient documentation

## 2020-11-30 DIAGNOSIS — Z5111 Encounter for antineoplastic chemotherapy: Secondary | ICD-10-CM | POA: Insufficient documentation

## 2020-11-30 DIAGNOSIS — C61 Malignant neoplasm of prostate: Secondary | ICD-10-CM | POA: Diagnosis not present

## 2020-11-30 DIAGNOSIS — K909 Intestinal malabsorption, unspecified: Secondary | ICD-10-CM | POA: Insufficient documentation

## 2020-11-30 LAB — IRON AND TIBC
Iron: 103 ug/dL (ref 42–163)
Saturation Ratios: 46 % (ref 20–55)
TIBC: 223 ug/dL (ref 202–409)
UIBC: 120 ug/dL (ref 117–376)

## 2020-11-30 LAB — CBC WITH DIFFERENTIAL (CANCER CENTER ONLY)
Abs Immature Granulocytes: 0.02 10*3/uL (ref 0.00–0.07)
Basophils Absolute: 0 10*3/uL (ref 0.0–0.1)
Basophils Relative: 1 %
Eosinophils Absolute: 0 10*3/uL (ref 0.0–0.5)
Eosinophils Relative: 1 %
HCT: 29.5 % — ABNORMAL LOW (ref 39.0–52.0)
Hemoglobin: 9.7 g/dL — ABNORMAL LOW (ref 13.0–17.0)
Immature Granulocytes: 1 %
Lymphocytes Relative: 35 %
Lymphs Abs: 0.9 10*3/uL (ref 0.7–4.0)
MCH: 34.2 pg — ABNORMAL HIGH (ref 26.0–34.0)
MCHC: 32.9 g/dL (ref 30.0–36.0)
MCV: 103.9 fL — ABNORMAL HIGH (ref 80.0–100.0)
Monocytes Absolute: 0.7 10*3/uL (ref 0.1–1.0)
Monocytes Relative: 26 %
Neutro Abs: 0.9 10*3/uL — ABNORMAL LOW (ref 1.7–7.7)
Neutrophils Relative %: 36 %
Platelet Count: 193 10*3/uL (ref 150–400)
RBC: 2.84 MIL/uL — ABNORMAL LOW (ref 4.22–5.81)
RDW: 15.2 % (ref 11.5–15.5)
WBC Count: 2.5 10*3/uL — ABNORMAL LOW (ref 4.0–10.5)
nRBC: 0 % (ref 0.0–0.2)

## 2020-11-30 LAB — CMP (CANCER CENTER ONLY)
ALT: 13 U/L (ref 0–44)
AST: 14 U/L — ABNORMAL LOW (ref 15–41)
Albumin: 4 g/dL (ref 3.5–5.0)
Alkaline Phosphatase: 58 U/L (ref 38–126)
Anion gap: 8 (ref 5–15)
BUN: 17 mg/dL (ref 8–23)
CO2: 25 mmol/L (ref 22–32)
Calcium: 9.7 mg/dL (ref 8.9–10.3)
Chloride: 105 mmol/L (ref 98–111)
Creatinine: 1.47 mg/dL — ABNORMAL HIGH (ref 0.61–1.24)
GFR, Estimated: 50 mL/min — ABNORMAL LOW (ref >60)
Glucose, Bld: 107 mg/dL — ABNORMAL HIGH (ref 70–99)
Potassium: 3.9 mmol/L (ref 3.5–5.1)
Sodium: 138 mmol/L (ref 135–145)
Total Bilirubin: 0.3 mg/dL (ref 0.3–1.2)
Total Protein: 6.3 g/dL — ABNORMAL LOW (ref 6.5–8.1)

## 2020-11-30 LAB — RETICULOCYTES
Immature Retic Fract: 17.9 % — ABNORMAL HIGH (ref 2.3–15.9)
RBC.: 2.87 MIL/uL — ABNORMAL LOW (ref 4.22–5.81)
Retic Count, Absolute: 37.6 10*3/uL (ref 19.0–186.0)
Retic Ct Pct: 1.3 % (ref 0.4–3.1)

## 2020-11-30 LAB — FERRITIN: Ferritin: 828 ng/mL — ABNORMAL HIGH (ref 24–336)

## 2020-11-30 LAB — LACTATE DEHYDROGENASE: LDH: 161 U/L (ref 98–192)

## 2020-11-30 LAB — SAMPLE TO BLOOD BANK

## 2020-11-30 LAB — MAGNESIUM: Magnesium: 2.1 mg/dL (ref 1.7–2.4)

## 2020-11-30 MED ORDER — POTASSIUM CHLORIDE IN NACL 20-0.9 MEQ/L-% IV SOLN
Freq: Once | INTRAVENOUS | Status: AC
  Filled 2020-11-30: qty 1000

## 2020-11-30 MED ORDER — MAGNESIUM SULFATE 2 GM/50ML IV SOLN
2.0000 g | Freq: Once | INTRAVENOUS | Status: AC
  Administered 2020-11-30: 2 g via INTRAVENOUS
  Filled 2020-11-30: qty 50

## 2020-11-30 MED ORDER — SODIUM CHLORIDE 0.9 % IV SOLN
10.0000 mg | Freq: Once | INTRAVENOUS | Status: AC
  Administered 2020-11-30: 10 mg via INTRAVENOUS
  Filled 2020-11-30: qty 10

## 2020-11-30 MED ORDER — ATROPINE SULFATE 1 MG/ML IV SOLN
0.5000 mg | Freq: Once | INTRAVENOUS | Status: AC | PRN
  Administered 2020-11-30: 0.5 mg via INTRAVENOUS
  Filled 2020-11-30: qty 1

## 2020-11-30 MED ORDER — SOLIFENACIN SUCCINATE 10 MG PO TABS
10.0000 mg | ORAL_TABLET | Freq: Every day | ORAL | 1 refills | Status: DC
  Filled 2020-11-30: qty 30, 30d supply, fill #0
  Filled 2021-01-02: qty 30, 30d supply, fill #1

## 2020-11-30 MED ORDER — PALONOSETRON HCL INJECTION 0.25 MG/5ML
0.2500 mg | Freq: Once | INTRAVENOUS | Status: AC
  Administered 2020-11-30: 0.25 mg via INTRAVENOUS
  Filled 2020-11-30: qty 5

## 2020-11-30 MED ORDER — SODIUM CHLORIDE 0.9 % IV SOLN
65.0000 mg/m2 | Freq: Once | INTRAVENOUS | Status: AC
  Administered 2020-11-30: 120 mg via INTRAVENOUS
  Filled 2020-11-30: qty 6

## 2020-11-30 MED ORDER — PEGFILGRASTIM 6 MG/0.6ML ~~LOC~~ PSKT
6.0000 mg | PREFILLED_SYRINGE | Freq: Once | SUBCUTANEOUS | Status: AC
  Administered 2020-11-30: 6 mg via SUBCUTANEOUS
  Filled 2020-11-30: qty 0.6

## 2020-11-30 MED ORDER — SODIUM CHLORIDE 0.9% FLUSH
10.0000 mL | INTRAVENOUS | Status: DC | PRN
  Administered 2020-11-30: 10 mL

## 2020-11-30 MED ORDER — SODIUM CHLORIDE 0.9 % IV SOLN
30.0000 mg/m2 | Freq: Once | INTRAVENOUS | Status: AC
  Administered 2020-11-30: 55 mg via INTRAVENOUS
  Filled 2020-11-30: qty 45

## 2020-11-30 MED ORDER — SODIUM CHLORIDE 0.9 % IV SOLN
Freq: Once | INTRAVENOUS | Status: AC
Start: 2020-11-30 — End: 2020-11-30

## 2020-11-30 MED ORDER — HEPARIN SOD (PORK) LOCK FLUSH 100 UNIT/ML IV SOLN
500.0000 [IU] | Freq: Once | INTRAVENOUS | Status: AC | PRN
  Administered 2020-11-30: 500 [IU]

## 2020-11-30 MED ORDER — SODIUM CHLORIDE 0.9 % IV SOLN
150.0000 mg | Freq: Once | INTRAVENOUS | Status: AC
  Administered 2020-11-30: 150 mg via INTRAVENOUS
  Filled 2020-11-30: qty 150

## 2020-11-30 NOTE — Patient Instructions (Signed)

## 2020-11-30 NOTE — Patient Instructions (Signed)
[facility] [place]  Discharge Instructions: Thank you for choosing [facility] to provide your oncology and hematology care.   If you have a lab appointment with the [facility], please go directly to the [facility] and check in at the registration area.  Wear comfortable clothing and clothing appropriate for easy access to any Portacath or PICC line.   We strive to give you quality time with your provider. You may need to reschedule your appointment if you arrive late (15 or more minutes).  Arriving late affects you and other patients whose appointments are after yours.  Also, if you miss three or more appointments without notifying the office, you may be dismissed from the clinic at the provider's discretion.      For prescription refill requests, have your pharmacy contact our office and allow 72 hours for refills to be completed.    Today you received the following chemotherapy and/or immunotherapy agents cpt-11, cisplatin    To help prevent nausea and vomiting after your treatment, we encourage you to take your nausea medication as directed.  BELOW ARE SYMPTOMS THAT SHOULD BE REPORTED IMMEDIATELY: *FEVER GREATER THAN 100.4 F (38 C) OR HIGHER *CHILLS OR SWEATING *NAUSEA AND VOMITING THAT IS NOT CONTROLLED WITH YOUR NAUSEA MEDICATION *UNUSUAL SHORTNESS OF BREATH *UNUSUAL BRUISING OR BLEEDING *URINARY PROBLEMS (pain or burning when urinating, or frequent urination) *BOWEL PROBLEMS (unusual diarrhea, constipation, pain near the anus) TENDERNESS IN MOUTH AND THROAT WITH OR WITHOUT PRESENCE OF ULCERS (sore throat, sores in mouth, or a toothache) UNUSUAL RASH, SWELLING OR PAIN  UNUSUAL VAGINAL DISCHARGE OR ITCHING   Items with * indicate a potential emergency and should be followed up as soon as possible or go to the Emergency Department if any problems should occur.  Please show the CHEMOTHERAPY ALERT CARD or IMMUNOTHERAPY ALERT CARD at check-in to  the Emergency Department and triage nurse. Should you have questions after your visit or need to cancel or reschedule your appointment, please contact [facility]  [phone] and follow the prompts.  Office hours are 8:00 a.m. to 4:30 p.m. Monday - Friday. Please note that voicemails left after 4:00 p.m. may not be returned until the following business day.  We are closed weekends and major holidays. You have access to a nurse at all times for urgent questions. Please call the main number to the clinic [phone] and follow the prompts.  For any non-urgent questions, you may also contact your provider using MyChart. We now offer e-Visits for anyone 67 and older to request care online for non-urgent symptoms. For details visit [email or web address].   Also download the MyChart app! Go to the app store, search "MyChart", open the app, select [facility], and log in with your MyChart username and password.  Due to Covid, a mask is required upon entering the hospital/clinic. If you do not have a mask, one will be given to you upon arrival. For doctor visits, patients may have 1 support person aged 36 or older with them. For treatment visits, patients cannot have anyone with them due to current Covid guidelines and our immunocompromised population.

## 2020-11-30 NOTE — Progress Notes (Signed)
Clarified w provider. Patient to get Neulasta Onpro today to save a trip to clinic. No auth required per PA team.  Ok to proceed with UOP < 220mL.  Patient will not receive Day 8.  Raul Del [place], [place], BCPS, BCOP 11/30/2020 12:45 PM

## 2020-11-30 NOTE — Progress Notes (Signed)
Per Dr. Marin Olp,            Faythe Ghee to treat despite labs, patient to get neulasta tomorrow.

## 2020-11-30 NOTE — Progress Notes (Signed)
Hematology and Oncology Follow Up Visit  [name] [id number] [birth year] 75 y.o. 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 Iron def anemia -- malabsorption   Past Therapy: Zytiga 1000 mg by mouth daily - discontinued on 08/15/2016 Xtandi 160 mg po q day - start on 08/15/2016 - discontinued Radium-223 therapy -- s/p cycle #6 Palliative radiation to the right sacrum Lutathera - s/p cycle 4/4 --last dose on 07/11/2017 Carbo/VP-16/Tecentriq --  S/p cycle #5 Keytruda q 6 week -- Maintanence -- s/p cycle #3 - changed from 3 to 6 week on 06/30/2018 Taxotere/Keytruda -- start on 09/17/2018 -- s/p cycle #10 -- d/c due to progression Lurbinectedin -- started on 04/29/2019, s/p cycle 17 -- d/c on 05/2020 due to progression   Current Therapy:        CDDP/Irinotecan -- s/p cycle 5 -- start on 06/15/2020 Xgeva 120 mg subcutaneous Q3 months - due in 01/2021 Lupron 22 mg IM every 3 months - due in 01/2021  SBRT to sacral lesion IV iron as indicated    Interim History:  Mr. Massenburg is here today for follow-up and treatment. He has been quite busy moving to a new home with his wife. They are slowly getting settled in.  He has some mild fatigue at times.  No fever, chills, n/v, cough, rash, dizziness, SOB, chest pain, palpitations, abdominal pain or changes in bladder habits.  He varies between diarrhea and constipation after treatment with intermittent abdominal cramping. He has minimal blood in his stool when straining and frequency. He uses a stool softener or imodium as needed.   No other blood loss noted. No bruising, no petechiae.  He has minimal right foot swelling in the mornings secondary to previous sacral tumor.  No falls or syncope to report. He ambulates with a cane for added support.  He is eating well and doing his best to stay well hydrated. His weight is 152 lbs.   ECOG  Performance Status: 1 - Symptomatic but completely ambulatory  Medications:  Allergies as of 11/30/2020       Reactions   Codeine Nausea And Vomiting   Hydrocodone Nausea And Vomiting        Medication List        Accurate as of November 30, 2020  8:26 AM. If you have any questions, ask your nurse or doctor.          acetaminophen 500 MG tablet Commonly known as: TYLENOL Take 1 tablet (500 mg total) by mouth every 4 (four) hours as needed for mild pain, headache or fever (fever >/= 101).   Anti-Diarrheal 2 MG tablet Generic drug: loperamide Take 2 tablets by mouth at diarrhea onset, then 1 tablet every 2 hours until 12 hours with no bowel movment. May take 2 tablets every 4 hours at night. If diarrhea recurs repeat. What changed:  how much to take how to take this when to take this reasons to take this   ascorbic acid 500 MG tablet Commonly known as: VITAMIN C Take 1 tablet (500 mg total) by mouth daily.   aspirin 81 MG tablet Take 81 mg by mouth daily.   dexamethasone 4 MG tablet Commonly known as: DECADRON Take 4 mg by mouth daily. X 3 days   ezetimibe 10 MG tablet Commonly known as: ZETIA TAKE 1 TABLET BY MOUTH DAILY What changed: how much to take   Generlac 10 GM/15ML Soln Generic drug:  lactulose (encephalopathy) Take 15 mLs (10 g total) by mouth daily as needed for constipation.   isosorbide mononitrate 30 MG 24 hr tablet Commonly known as: IMDUR TAKE 1 TABLET (30 MG TOTAL) BY MOUTH DAILY. What changed:  how much to take when to take this   lidocaine-prilocaine cream Commonly known as: EMLA Apply to affected area once What changed:  how much to take how to take this when to take this additional instructions   ondansetron 8 MG tablet Commonly known as: Zofran Take 1 tablet (8 mg total) by mouth 2 (two) times daily as needed. Start on the third day after cisplatin chemotherapy. What changed: reasons to take this   OVER THE COUNTER  MEDICATION 1 each by Other route See admin instructions. Juice Plus -- 8 capsule of each the garden, vineyard, and orchard twice a day.   pantoprazole 40 MG tablet Commonly known as: PROTONIX TAKE 1 TABLET (40 MG TOTAL) BY MOUTH AT BEDTIME. What changed: how much to take   prochlorperazine 10 MG tablet Commonly known as: COMPAZINE Take 1 tablet (10 mg total) by mouth every 6 (six) hours as needed for nausea or vomiting.   solifenacin 10 MG tablet Commonly known as: VESIcare Take 1 tablet (10 mg total) by mouth daily.   Zinc 50 MG Caps Take 50 mg by mouth daily.        Allergies:  Allergies  Allergen Reactions   Codeine Nausea And Vomiting   Hydrocodone Nausea And Vomiting    Past Medical History, Surgical history, Social history, and Family History were reviewed and updated.  Review of Systems: All other 10 point review of systems is negative.   Physical Exam:  height is 5\' 8"  (1.727 m) and weight is 152 lb (68.9 kg). His blood pressure is 129/76 and his pulse is 101 (abnormal). His respiration is 17 and oxygen saturation is 100%.   Wt Readings from Last 3 Encounters:  11/30/20 152 lb (68.9 kg)  11/03/20 157 lb (71.2 kg)  10/20/20 159 lb 12.8 oz (72.5 kg)    Ocular: Sclerae unicteric, pupils equal, round and reactive to light Ear-nose-throat: Oropharynx clear, dentition fair Lymphatic: No cervical or supraclavicular adenopathy Lungs no rales or rhonchi, good excursion bilaterally Heart regular rate and rhythm, no murmur appreciated Abd soft, nontender, positive bowel sounds MSK no focal spinal tenderness, no joint edema Neuro: non-focal, well-oriented, appropriate affect Breasts: Deferred   Lab Results  Component Value Date   WBC 2.5 (L) 11/30/2020   HGB 9.7 (L) 11/30/2020   HCT 29.5 (L) 11/30/2020   MCV 103.9 (H) 11/30/2020   PLT 193 11/30/2020   Lab Results  Component Value Date   FERRITIN 806 (H) 11/03/2020   IRON 92 11/03/2020   TIBC 258  11/03/2020   UIBC 166 11/03/2020   IRONPCTSAT 36 11/03/2020   Lab Results  Component Value Date   RETICCTPCT 0.9 11/03/2020   RBC 2.84 (L) 11/30/2020   No results found for: KPAFRELGTCHN, LAMBDASER, KAPLAMBRATIO No results found for: IGGSERUM, IGA, IGMSERUM No results found for: Odetta Pink, SPEI   Chemistry      Component Value Date/Time   NA 137 11/10/2020 0820   NA 139 10/19/2020 1444   NA 144 12/14/2016 1159   NA 140 10/22/2016 1129   K 3.8 11/10/2020 0820   K 3.5 12/14/2016 1159   K 4.3 10/22/2016 1129   CL 102 11/10/2020 0820   CL 103 12/14/2016 1159  CO2 23 11/10/2020 0820   CO2 30 12/14/2016 1159   CO2 27 10/22/2016 1129   BUN 26 (H) 11/10/2020 0820   BUN 17 10/19/2020 1444   BUN 16 12/14/2016 1159   BUN 14.0 10/22/2016 1129   CREATININE 1.54 (H) 11/10/2020 0820   CREATININE 1.3 (H) 12/14/2016 1159   CREATININE 1.1 10/22/2016 1129      Component Value Date/Time   CALCIUM 10.4 (H) 11/10/2020 0820   CALCIUM 10.0 12/14/2016 1159   CALCIUM 10.4 10/22/2016 1129   ALKPHOS 65 11/10/2020 0820   ALKPHOS 48 12/14/2016 1159   ALKPHOS 60 10/22/2016 1129   AST 11 (L) 11/10/2020 0820   AST 16 10/22/2016 1129   ALT 14 11/10/2020 0820   ALT 23 12/14/2016 1159   ALT 14 10/22/2016 1129   BILITOT 0.3 11/10/2020 0820   BILITOT 0.31 10/22/2016 1129       Impression and Plan: Mr. Diveley is 74 yo caucasian gentleman with metastatic neuroendocrine carcinoma of unknown primary.   Chromogranin A 928.  Ok to proceed with treatment today per Dr. Marin Olp with ANC 0.9.  We did do the Neulasta Onpro this time per MD for his low ANC. We will skip day 8 next week and resume with treatment the following week (12/14/2020), no Neulasta with that cycle unless otherwise stated.  We will plan to see him again in 4 weeks (12/28/2020) at the start of cycle 7.  He can contact our office with any questions or concerns.   Lottie Dawson,  NP 11/16/20228:26 AM

## 2020-12-01 ENCOUNTER — Telehealth: Payer: Self-pay | Admitting: *Deleted

## 2020-12-01 LAB — CHROMOGRANIN A: Chromogranin A (ng/mL): 928 ng/mL — ABNORMAL HIGH (ref 0.0–101.8)

## 2020-12-01 NOTE — Telephone Encounter (Signed)
No 11/30/20 LOS

## 2020-12-02 ENCOUNTER — Other Ambulatory Visit (HOSPITAL_BASED_OUTPATIENT_CLINIC_OR_DEPARTMENT_OTHER): Payer: Self-pay

## 2020-12-02 ENCOUNTER — Encounter: Payer: Self-pay | Admitting: Hematology & Oncology

## 2020-12-02 LAB — ERYTHROPOIETIN: Erythropoietin: 20.7 m[IU]/mL — ABNORMAL HIGH (ref 2.6–18.5)

## 2020-12-07 DIAGNOSIS — M9903 Segmental and somatic dysfunction of lumbar region: Secondary | ICD-10-CM | POA: Diagnosis not present

## 2020-12-07 DIAGNOSIS — M9902 Segmental and somatic dysfunction of thoracic region: Secondary | ICD-10-CM | POA: Diagnosis not present

## 2020-12-07 DIAGNOSIS — M9904 Segmental and somatic dysfunction of sacral region: Secondary | ICD-10-CM | POA: Diagnosis not present

## 2020-12-07 DIAGNOSIS — M9901 Segmental and somatic dysfunction of cervical region: Secondary | ICD-10-CM | POA: Diagnosis not present

## 2020-12-12 ENCOUNTER — Other Ambulatory Visit: Payer: Self-pay | Admitting: *Deleted

## 2020-12-12 ENCOUNTER — Telehealth: Payer: Self-pay | Admitting: *Deleted

## 2020-12-12 MED ORDER — AMOXICILLIN 500 MG PO TABS: 2000.0000 mg | ORAL_TABLET | Freq: Once | ORAL | 0 refills | Status: AC

## 2020-12-12 NOTE — Telephone Encounter (Signed)
Patient has dental appt with cleaning today.  Dr Marin Olp notified.  Antibiotic sent to pharmacy.

## 2020-12-13 DIAGNOSIS — M9902 Segmental and somatic dysfunction of thoracic region: Secondary | ICD-10-CM | POA: Diagnosis not present

## 2020-12-13 DIAGNOSIS — M9901 Segmental and somatic dysfunction of cervical region: Secondary | ICD-10-CM | POA: Diagnosis not present

## 2020-12-13 DIAGNOSIS — M9904 Segmental and somatic dysfunction of sacral region: Secondary | ICD-10-CM | POA: Diagnosis not present

## 2020-12-13 DIAGNOSIS — M9903 Segmental and somatic dysfunction of lumbar region: Secondary | ICD-10-CM | POA: Diagnosis not present

## 2020-12-14 ENCOUNTER — Inpatient Hospital Stay: Payer: Medicare Other

## 2020-12-14 ENCOUNTER — Other Ambulatory Visit: Payer: Self-pay | Admitting: Hematology & Oncology

## 2020-12-14 ENCOUNTER — Other Ambulatory Visit: Payer: Self-pay | Admitting: *Deleted

## 2020-12-14 VITALS — BP 131/86 | HR 98 | Temp 98.7°F | Resp 17 | Wt 151.0 lb

## 2020-12-14 DIAGNOSIS — R5383 Other fatigue: Secondary | ICD-10-CM | POA: Diagnosis not present

## 2020-12-14 DIAGNOSIS — M7989 Other specified soft tissue disorders: Secondary | ICD-10-CM | POA: Diagnosis not present

## 2020-12-14 DIAGNOSIS — C7951 Secondary malignant neoplasm of bone: Secondary | ICD-10-CM

## 2020-12-14 DIAGNOSIS — Z5111 Encounter for antineoplastic chemotherapy: Secondary | ICD-10-CM | POA: Diagnosis not present

## 2020-12-14 DIAGNOSIS — C61 Malignant neoplasm of prostate: Secondary | ICD-10-CM | POA: Diagnosis not present

## 2020-12-14 DIAGNOSIS — D631 Anemia in chronic kidney disease: Secondary | ICD-10-CM

## 2020-12-14 DIAGNOSIS — Z95828 Presence of other vascular implants and grafts: Secondary | ICD-10-CM

## 2020-12-14 DIAGNOSIS — C7A8 Other malignant neuroendocrine tumors: Secondary | ICD-10-CM

## 2020-12-14 DIAGNOSIS — K909 Intestinal malabsorption, unspecified: Secondary | ICD-10-CM | POA: Diagnosis not present

## 2020-12-14 DIAGNOSIS — D508 Other iron deficiency anemias: Secondary | ICD-10-CM | POA: Diagnosis not present

## 2020-12-14 LAB — CBC WITH DIFFERENTIAL (CANCER CENTER ONLY)
Abs Immature Granulocytes: 0.13 10*3/uL — ABNORMAL HIGH (ref 0.00–0.07)
Basophils Absolute: 0 10*3/uL (ref 0.0–0.1)
Basophils Relative: 0 %
Eosinophils Absolute: 0 10*3/uL (ref 0.0–0.5)
Eosinophils Relative: 0 %
HCT: 28.1 % — ABNORMAL LOW (ref 39.0–52.0)
Hemoglobin: 9.2 g/dL — ABNORMAL LOW (ref 13.0–17.0)
Immature Granulocytes: 1 %
Lymphocytes Relative: 14 %
Lymphs Abs: 1.3 10*3/uL (ref 0.7–4.0)
MCH: 34.2 pg — ABNORMAL HIGH (ref 26.0–34.0)
MCHC: 32.7 g/dL (ref 30.0–36.0)
MCV: 104.5 fL — ABNORMAL HIGH (ref 80.0–100.0)
Monocytes Absolute: 0.9 10*3/uL (ref 0.1–1.0)
Monocytes Relative: 9 %
Neutro Abs: 7.1 10*3/uL (ref 1.7–7.7)
Neutrophils Relative %: 76 %
Platelet Count: 277 10*3/uL (ref 150–400)
RBC: 2.69 MIL/uL — ABNORMAL LOW (ref 4.22–5.81)
RDW: 15.9 % — ABNORMAL HIGH (ref 11.5–15.5)
WBC Count: 9.5 10*3/uL (ref 4.0–10.5)
nRBC: 0 % (ref 0.0–0.2)

## 2020-12-14 LAB — CMP (CANCER CENTER ONLY)
ALT: 8 U/L (ref 0–44)
AST: 11 U/L — ABNORMAL LOW (ref 15–41)
Albumin: 4.1 g/dL (ref 3.5–5.0)
Alkaline Phosphatase: 93 U/L (ref 38–126)
Anion gap: 8 (ref 5–15)
BUN: 21 mg/dL (ref 8–23)
CO2: 26 mmol/L (ref 22–32)
Calcium: 10.1 mg/dL (ref 8.9–10.3)
Chloride: 101 mmol/L (ref 98–111)
Creatinine: 1.57 mg/dL — ABNORMAL HIGH (ref 0.61–1.24)
GFR, Estimated: 47 mL/min — ABNORMAL LOW (ref >60)
Glucose, Bld: 108 mg/dL — ABNORMAL HIGH (ref 70–99)
Potassium: 4.2 mmol/L (ref 3.5–5.1)
Sodium: 135 mmol/L (ref 135–145)
Total Bilirubin: 0.3 mg/dL (ref 0.3–1.2)
Total Protein: 6.3 g/dL — ABNORMAL LOW (ref 6.5–8.1)

## 2020-12-14 LAB — MAGNESIUM: Magnesium: 2.2 mg/dL (ref 1.7–2.4)

## 2020-12-14 MED ORDER — SODIUM CHLORIDE 0.9 % IV SOLN
65.0000 mg/m2 | Freq: Once | INTRAVENOUS | Status: AC
  Administered 2020-12-14: 120 mg via INTRAVENOUS
  Filled 2020-12-14: qty 6

## 2020-12-14 MED ORDER — PALONOSETRON HCL INJECTION 0.25 MG/5ML
0.2500 mg | Freq: Once | INTRAVENOUS | Status: AC
  Administered 2020-12-14: 0.25 mg via INTRAVENOUS
  Filled 2020-12-14: qty 5

## 2020-12-14 MED ORDER — SODIUM CHLORIDE 0.9% FLUSH
10.0000 mL | INTRAVENOUS | Status: DC | PRN
  Administered 2020-12-14: 10 mL

## 2020-12-14 MED ORDER — SODIUM CHLORIDE 0.9 % IV SOLN: Freq: Once | INTRAVENOUS | Status: AC

## 2020-12-14 MED ORDER — SODIUM CHLORIDE 0.9 % IV SOLN
10.0000 mg | Freq: Once | INTRAVENOUS | Status: AC
  Administered 2020-12-14: 10 mg via INTRAVENOUS
  Filled 2020-12-14: qty 10

## 2020-12-14 MED ORDER — SODIUM CHLORIDE 0.9 % IV SOLN
27.0000 mg/m2 | Freq: Once | INTRAVENOUS | Status: AC
  Administered 2020-12-14: 50 mg via INTRAVENOUS
  Filled 2020-12-14: qty 50

## 2020-12-14 MED ORDER — HEPARIN SOD (PORK) LOCK FLUSH 100 UNIT/ML IV SOLN
500.0000 [IU] | Freq: Once | INTRAVENOUS | Status: AC | PRN
  Administered 2020-12-14: 500 [IU]

## 2020-12-14 MED ORDER — POTASSIUM CHLORIDE IN NACL 20-0.9 MEQ/L-% IV SOLN
Freq: Once | INTRAVENOUS | Status: AC
  Filled 2020-12-14: qty 1000

## 2020-12-14 MED ORDER — MAGNESIUM SULFATE 2 GM/50ML IV SOLN
2.0000 g | Freq: Once | INTRAVENOUS | Status: AC
  Administered 2020-12-14: 2 g via INTRAVENOUS
  Filled 2020-12-14: qty 50

## 2020-12-14 MED ORDER — ATROPINE SULFATE 1 MG/ML IV SOLN
0.5000 mg | Freq: Once | INTRAVENOUS | Status: AC | PRN
  Administered 2020-12-14: 0.5 mg via INTRAVENOUS
  Filled 2020-12-14: qty 1

## 2020-12-14 MED ORDER — SODIUM CHLORIDE 0.9 % IV SOLN
150.0000 mg | Freq: Once | INTRAVENOUS | Status: AC
  Administered 2020-12-14: 150 mg via INTRAVENOUS
  Filled 2020-12-14: qty 150

## 2020-12-14 NOTE — Progress Notes (Signed)
Per dr Marin Olp okay to give cisplatin with 120 ml urine output

## 2020-12-14 NOTE — Patient Instructions (Signed)

## 2020-12-14 NOTE — Patient Instructions (Signed)
[facility] [place]  Discharge Instructions: Thank you for choosing [facility] to provide your oncology and hematology care.   If you have a lab appointment with the [facility], please go directly to the [facility] and check in at the registration area.  Wear comfortable clothing and clothing appropriate for easy access to any Portacath or PICC line.   We strive to give you quality time with your provider. You may need to reschedule your appointment if you arrive late (15 or more minutes).  Arriving late affects you and other patients whose appointments are after yours.  Also, if you miss three or more appointments without notifying the office, you may be dismissed from the clinic at the provider's discretion.      For prescription refill requests, have your pharmacy contact our office and allow 72 hours for refills to be completed.    Today you received the following chemotherapy and/or immunotherapy agents Cisplatin and Camptosar      To help prevent nausea and vomiting after your treatment, we encourage you to take your nausea medication as directed.  BELOW ARE SYMPTOMS THAT SHOULD BE REPORTED IMMEDIATELY: *FEVER GREATER THAN 100.4 F (38 C) OR HIGHER *CHILLS OR SWEATING *NAUSEA AND VOMITING THAT IS NOT CONTROLLED WITH YOUR NAUSEA MEDICATION *UNUSUAL SHORTNESS OF BREATH *UNUSUAL BRUISING OR BLEEDING *URINARY PROBLEMS (pain or burning when urinating, or frequent urination) *BOWEL PROBLEMS (unusual diarrhea, constipation, pain near the anus) TENDERNESS IN MOUTH AND THROAT WITH OR WITHOUT PRESENCE OF ULCERS (sore throat, sores in mouth, or a toothache) UNUSUAL RASH, SWELLING OR PAIN  UNUSUAL VAGINAL DISCHARGE OR ITCHING   Items with * indicate a potential emergency and should be followed up as soon as possible or go to the Emergency Department if any problems should occur.  Please show the CHEMOTHERAPY ALERT CARD or IMMUNOTHERAPY ALERT CARD at  check-in to the Emergency Department and triage nurse. Should you have questions after your visit or need to cancel or reschedule your appointment, please contact [facility]  [phone] and follow the prompts.  Office hours are 8:00 a.m. to 4:30 p.m. Monday - Friday. Please note that voicemails left after 4:00 p.m. may not be returned until the following business day.  We are closed weekends and major holidays. You have access to a nurse at all times for urgent questions. Please call the main number to the clinic [phone] and follow the prompts.  For any non-urgent questions, you may also contact your provider using MyChart. We now offer e-Visits for anyone 62 and older to request care online for non-urgent symptoms. For details visit [email or web address].   Also download the MyChart app! Go to the app store, search "MyChart", open the app, select [facility], and log in with your MyChart username and password.  Due to Covid, a mask is required upon entering the hospital/clinic. If you do not have a mask, one will be given to you upon arrival. For doctor visits, patients may have 1 support person aged 46 or older with them. For treatment visits, patients cannot have anyone with them due to current Covid guidelines and our immunocompromised population.

## 2020-12-14 NOTE — Progress Notes (Signed)
Ok to treat with creatinine of 1.57 per Dr Marin Olp. dph

## 2020-12-15 DIAGNOSIS — M9904 Segmental and somatic dysfunction of sacral region: Secondary | ICD-10-CM | POA: Diagnosis not present

## 2020-12-15 DIAGNOSIS — M9902 Segmental and somatic dysfunction of thoracic region: Secondary | ICD-10-CM | POA: Diagnosis not present

## 2020-12-15 DIAGNOSIS — M9901 Segmental and somatic dysfunction of cervical region: Secondary | ICD-10-CM | POA: Diagnosis not present

## 2020-12-15 DIAGNOSIS — M9903 Segmental and somatic dysfunction of lumbar region: Secondary | ICD-10-CM | POA: Diagnosis not present

## 2020-12-19 ENCOUNTER — Other Ambulatory Visit: Payer: Self-pay | Admitting: *Deleted

## 2020-12-19 MED ORDER — AMOXICILLIN 500 MG PO TABS: 2000.0000 mg | ORAL_TABLET | Freq: Once | ORAL | 0 refills | Status: AC

## 2020-12-22 ENCOUNTER — Other Ambulatory Visit (HOSPITAL_BASED_OUTPATIENT_CLINIC_OR_DEPARTMENT_OTHER): Payer: Self-pay

## 2020-12-22 DIAGNOSIS — C61 Malignant neoplasm of prostate: Secondary | ICD-10-CM | POA: Diagnosis not present

## 2020-12-22 DIAGNOSIS — E1169 Type 2 diabetes mellitus with other specified complication: Secondary | ICD-10-CM | POA: Diagnosis not present

## 2020-12-22 DIAGNOSIS — R7301 Impaired fasting glucose: Secondary | ICD-10-CM | POA: Diagnosis not present

## 2020-12-22 DIAGNOSIS — I10 Essential (primary) hypertension: Secondary | ICD-10-CM | POA: Diagnosis not present

## 2020-12-22 DIAGNOSIS — E785 Hyperlipidemia, unspecified: Secondary | ICD-10-CM | POA: Diagnosis not present

## 2020-12-22 MED ORDER — EZETIMIBE 10 MG PO TABS
10.0000 mg | ORAL_TABLET | Freq: Every day | ORAL | 3 refills | Status: AC
  Filled 2020-12-22: qty 90, 90d supply, fill #0
  Filled 2021-03-09 – 2021-04-03 (×2): qty 90, 90d supply, fill #1

## 2020-12-23 ENCOUNTER — Other Ambulatory Visit (HOSPITAL_BASED_OUTPATIENT_CLINIC_OR_DEPARTMENT_OTHER): Payer: Self-pay

## 2020-12-29 DIAGNOSIS — M9903 Segmental and somatic dysfunction of lumbar region: Secondary | ICD-10-CM | POA: Diagnosis not present

## 2020-12-29 DIAGNOSIS — M9901 Segmental and somatic dysfunction of cervical region: Secondary | ICD-10-CM | POA: Diagnosis not present

## 2020-12-29 DIAGNOSIS — M9904 Segmental and somatic dysfunction of sacral region: Secondary | ICD-10-CM | POA: Diagnosis not present

## 2020-12-29 DIAGNOSIS — M9902 Segmental and somatic dysfunction of thoracic region: Secondary | ICD-10-CM | POA: Diagnosis not present

## 2021-01-02 ENCOUNTER — Other Ambulatory Visit (HOSPITAL_BASED_OUTPATIENT_CLINIC_OR_DEPARTMENT_OTHER): Payer: Self-pay

## 2021-01-02 ENCOUNTER — Other Ambulatory Visit: Payer: Self-pay | Admitting: Cardiology

## 2021-01-02 ENCOUNTER — Other Ambulatory Visit: Payer: Self-pay | Admitting: Hematology & Oncology

## 2021-01-02 DIAGNOSIS — C7A8 Other malignant neuroendocrine tumors: Secondary | ICD-10-CM

## 2021-01-02 DIAGNOSIS — C7951 Secondary malignant neoplasm of bone: Secondary | ICD-10-CM

## 2021-01-02 MED ORDER — DEXAMETHASONE 4 MG PO TABS
8.0000 mg | ORAL_TABLET | Freq: Every day | ORAL | 1 refills | Status: DC
  Filled 2021-01-02: qty 30, 15d supply, fill #0

## 2021-01-02 MED ORDER — ISOSORBIDE MONONITRATE ER 30 MG PO TB24
ORAL_TABLET | Freq: Three times a day (TID) | ORAL | 2 refills | Status: AC | PRN
Start: 2021-01-02 — End: 2022-01-02
  Filled 2021-01-02: qty 90, 90d supply, fill #0
  Filled 2021-03-09 – 2021-04-03 (×2): qty 90, 90d supply, fill #1

## 2021-01-04 ENCOUNTER — Other Ambulatory Visit: Payer: Self-pay | Admitting: Family

## 2021-01-04 ENCOUNTER — Inpatient Hospital Stay: Payer: Medicare Other

## 2021-01-04 ENCOUNTER — Other Ambulatory Visit: Payer: Self-pay

## 2021-01-04 ENCOUNTER — Inpatient Hospital Stay (HOSPITAL_BASED_OUTPATIENT_CLINIC_OR_DEPARTMENT_OTHER): Payer: Medicare Other | Admitting: Hematology & Oncology

## 2021-01-04 ENCOUNTER — Encounter: Payer: Self-pay | Admitting: Hematology & Oncology

## 2021-01-04 ENCOUNTER — Inpatient Hospital Stay: Payer: Medicare Other | Attending: Hematology & Oncology

## 2021-01-04 VITALS — BP 145/72 | HR 97

## 2021-01-04 VITALS — BP 102/64 | HR 103 | Temp 98.3°F | Resp 18 | Ht 68.0 in | Wt 144.0 lb

## 2021-01-04 DIAGNOSIS — C7A8 Other malignant neuroendocrine tumors: Secondary | ICD-10-CM | POA: Diagnosis not present

## 2021-01-04 DIAGNOSIS — C7951 Secondary malignant neoplasm of bone: Secondary | ICD-10-CM

## 2021-01-04 DIAGNOSIS — Z5111 Encounter for antineoplastic chemotherapy: Secondary | ICD-10-CM | POA: Diagnosis not present

## 2021-01-04 DIAGNOSIS — Z5189 Encounter for other specified aftercare: Secondary | ICD-10-CM | POA: Insufficient documentation

## 2021-01-04 DIAGNOSIS — D508 Other iron deficiency anemias: Secondary | ICD-10-CM | POA: Insufficient documentation

## 2021-01-04 DIAGNOSIS — R197 Diarrhea, unspecified: Secondary | ICD-10-CM | POA: Diagnosis not present

## 2021-01-04 DIAGNOSIS — T451X5A Adverse effect of antineoplastic and immunosuppressive drugs, initial encounter: Secondary | ICD-10-CM | POA: Diagnosis not present

## 2021-01-04 DIAGNOSIS — Z79899 Other long term (current) drug therapy: Secondary | ICD-10-CM | POA: Insufficient documentation

## 2021-01-04 DIAGNOSIS — D6481 Anemia due to antineoplastic chemotherapy: Secondary | ICD-10-CM

## 2021-01-04 DIAGNOSIS — R531 Weakness: Secondary | ICD-10-CM | POA: Insufficient documentation

## 2021-01-04 DIAGNOSIS — D649 Anemia, unspecified: Secondary | ICD-10-CM

## 2021-01-04 DIAGNOSIS — K921 Melena: Secondary | ICD-10-CM | POA: Insufficient documentation

## 2021-01-04 DIAGNOSIS — D500 Iron deficiency anemia secondary to blood loss (chronic): Secondary | ICD-10-CM

## 2021-01-04 DIAGNOSIS — K909 Intestinal malabsorption, unspecified: Secondary | ICD-10-CM | POA: Insufficient documentation

## 2021-01-04 DIAGNOSIS — M2550 Pain in unspecified joint: Secondary | ICD-10-CM | POA: Diagnosis not present

## 2021-01-04 DIAGNOSIS — C61 Malignant neoplasm of prostate: Secondary | ICD-10-CM | POA: Diagnosis not present

## 2021-01-04 HISTORY — DX: Anemia due to antineoplastic chemotherapy: D64.81

## 2021-01-04 HISTORY — DX: Adverse effect of antineoplastic and immunosuppressive drugs, initial encounter: T45.1X5A

## 2021-01-04 LAB — CMP (CANCER CENTER ONLY)
ALT: 8 U/L (ref 0–44)
AST: 11 U/L — ABNORMAL LOW (ref 15–41)
Albumin: 4 g/dL (ref 3.5–5.0)
Alkaline Phosphatase: 55 U/L (ref 38–126)
Anion gap: 9 (ref 5–15)
BUN: 22 mg/dL (ref 8–23)
CO2: 23 mmol/L (ref 22–32)
Calcium: 9.3 mg/dL (ref 8.9–10.3)
Chloride: 105 mmol/L (ref 98–111)
Creatinine: 1.64 mg/dL — ABNORMAL HIGH (ref 0.61–1.24)
GFR, Estimated: 44 mL/min — ABNORMAL LOW (ref >60)
Glucose, Bld: 128 mg/dL — ABNORMAL HIGH (ref 70–99)
Potassium: 4 mmol/L (ref 3.5–5.1)
Sodium: 137 mmol/L (ref 135–145)
Total Bilirubin: 0.3 mg/dL (ref 0.3–1.2)
Total Protein: 6.3 g/dL — ABNORMAL LOW (ref 6.5–8.1)

## 2021-01-04 LAB — CBC WITH DIFFERENTIAL (CANCER CENTER ONLY)
Abs Immature Granulocytes: 0.06 10*3/uL (ref 0.00–0.07)
Basophils Absolute: 0 10*3/uL (ref 0.0–0.1)
Basophils Relative: 0 %
Eosinophils Absolute: 0.1 10*3/uL (ref 0.0–0.5)
Eosinophils Relative: 2 %
HCT: 24.8 % — ABNORMAL LOW (ref 39.0–52.0)
Hemoglobin: 8 g/dL — ABNORMAL LOW (ref 13.0–17.0)
Immature Granulocytes: 1 %
Lymphocytes Relative: 18 %
Lymphs Abs: 0.8 10*3/uL (ref 0.7–4.0)
MCH: 34.3 pg — ABNORMAL HIGH (ref 26.0–34.0)
MCHC: 32.3 g/dL (ref 30.0–36.0)
MCV: 106.4 fL — ABNORMAL HIGH (ref 80.0–100.0)
Monocytes Absolute: 0.7 10*3/uL (ref 0.1–1.0)
Monocytes Relative: 16 %
Neutro Abs: 2.8 10*3/uL (ref 1.7–7.7)
Neutrophils Relative %: 63 %
Platelet Count: 263 10*3/uL (ref 150–400)
RBC: 2.33 MIL/uL — ABNORMAL LOW (ref 4.22–5.81)
RDW: 15.9 % — ABNORMAL HIGH (ref 11.5–15.5)
WBC Count: 4.5 10*3/uL (ref 4.0–10.5)
nRBC: 0 % (ref 0.0–0.2)

## 2021-01-04 LAB — RETICULOCYTES
Immature Retic Fract: 16.2 % — ABNORMAL HIGH (ref 2.3–15.9)
RBC.: 2.28 MIL/uL — ABNORMAL LOW (ref 4.22–5.81)
Retic Count, Absolute: 43.5 10*3/uL (ref 19.0–186.0)
Retic Ct Pct: 1.9 % (ref 0.4–3.1)

## 2021-01-04 LAB — FERRITIN: Ferritin: 656 ng/mL — ABNORMAL HIGH (ref 24–336)

## 2021-01-04 LAB — IRON AND IRON BINDING CAPACITY (CC-WL,HP ONLY)
Iron: 72 ug/dL (ref 45–182)
Saturation Ratios: 29 % (ref 17.9–39.5)
TIBC: 251 ug/dL (ref 250–450)
UIBC: 179 ug/dL (ref 117–376)

## 2021-01-04 LAB — MAGNESIUM: Magnesium: 2 mg/dL (ref 1.7–2.4)

## 2021-01-04 LAB — PREPARE RBC (CROSSMATCH)

## 2021-01-04 LAB — LACTATE DEHYDROGENASE: LDH: 140 U/L (ref 98–192)

## 2021-01-04 MED ORDER — MAGNESIUM SULFATE 2 GM/50ML IV SOLN
2.0000 g | Freq: Once | INTRAVENOUS | Status: AC
  Administered 2021-01-04: 09:00:00 2 g via INTRAVENOUS
  Filled 2021-01-04: qty 50

## 2021-01-04 MED ORDER — PALONOSETRON HCL INJECTION 0.25 MG/5ML
0.2500 mg | Freq: Once | INTRAVENOUS | Status: AC
  Administered 2021-01-04: 11:00:00 0.25 mg via INTRAVENOUS
  Filled 2021-01-04: qty 5

## 2021-01-04 MED ORDER — PEGFILGRASTIM 6 MG/0.6ML ~~LOC~~ PSKT
6.0000 mg | PREFILLED_SYRINGE | Freq: Once | SUBCUTANEOUS | Status: AC
  Administered 2021-01-04: 15:00:00 6 mg via SUBCUTANEOUS
  Filled 2021-01-04: qty 0.6

## 2021-01-04 MED ORDER — SODIUM CHLORIDE 0.9 % IV SOLN
27.0000 mg/m2 | Freq: Once | INTRAVENOUS | Status: AC
  Administered 2021-01-04: 14:00:00 50 mg via INTRAVENOUS
  Filled 2021-01-04: qty 30

## 2021-01-04 MED ORDER — SODIUM CHLORIDE 0.9% FLUSH
10.0000 mL | INTRAVENOUS | Status: AC | PRN
  Administered 2021-01-04: 15:00:00 10 mL

## 2021-01-04 MED ORDER — HEPARIN SOD (PORK) LOCK FLUSH 100 UNIT/ML IV SOLN
500.0000 [IU] | Freq: Once | INTRAVENOUS | Status: AC | PRN
  Administered 2021-01-04: 15:00:00 500 [IU]

## 2021-01-04 MED ORDER — SODIUM CHLORIDE 0.9 % IV SOLN
65.0000 mg/m2 | Freq: Once | INTRAVENOUS | Status: AC
  Administered 2021-01-04: 12:00:00 120 mg via INTRAVENOUS
  Filled 2021-01-04: qty 6

## 2021-01-04 MED ORDER — ATROPINE SULFATE 1 MG/ML IV SOLN
0.5000 mg | Freq: Once | INTRAVENOUS | Status: AC | PRN
  Administered 2021-01-04: 12:00:00 0.5 mg via INTRAVENOUS
  Filled 2021-01-04: qty 1

## 2021-01-04 MED ORDER — SODIUM CHLORIDE 0.9 % IV SOLN
10.0000 mg | Freq: Once | INTRAVENOUS | Status: AC
  Administered 2021-01-04: 11:00:00 10 mg via INTRAVENOUS
  Filled 2021-01-04: qty 10

## 2021-01-04 MED ORDER — SODIUM CHLORIDE 0.9 % IV SOLN: Freq: Once | INTRAVENOUS | Status: AC

## 2021-01-04 MED ORDER — SODIUM CHLORIDE 0.9 % IV SOLN
150.0000 mg | Freq: Once | INTRAVENOUS | Status: AC
  Administered 2021-01-04: 11:00:00 150 mg via INTRAVENOUS
  Filled 2021-01-04: qty 150

## 2021-01-04 MED ORDER — POTASSIUM CHLORIDE IN NACL 20-0.9 MEQ/L-% IV SOLN
Freq: Once | INTRAVENOUS | Status: AC
  Filled 2021-01-04: qty 1000

## 2021-01-04 NOTE — Patient Instructions (Addendum)
[facility] [place]  Discharge Instructions: Thank you for choosing [facility] to provide your oncology and hematology care.   If you have a lab appointment with the [facility], please go directly to the [facility] and check in at the registration area.  Wear comfortable clothing and clothing appropriate for easy access to any Portacath or PICC line.   We strive to give you quality time with your provider. You may need to reschedule your appointment if you arrive late (15 or more minutes).  Arriving late affects you and other patients whose appointments are after yours.  Also, if you miss three or more appointments without notifying the office, you may be dismissed from the clinic at the providers discretion.      For prescription refill requests, have your pharmacy contact our office and allow 72 hours for refills to be completed.    Today you received the following chemotherapy and/or immunotherapy agents Cisplatin      To help prevent nausea and vomiting after your treatment, we encourage you to take your nausea medication as directed.  BELOW ARE SYMPTOMS THAT SHOULD BE REPORTED IMMEDIATELY: *FEVER GREATER THAN 100.4 F (38 C) OR HIGHER *CHILLS OR SWEATING *NAUSEA AND VOMITING THAT IS NOT CONTROLLED WITH YOUR NAUSEA MEDICATION *UNUSUAL SHORTNESS OF BREATH *UNUSUAL BRUISING OR BLEEDING *URINARY PROBLEMS (pain or burning when urinating, or frequent urination) *BOWEL PROBLEMS (unusual diarrhea, constipation, pain near the anus) TENDERNESS IN MOUTH AND THROAT WITH OR WITHOUT PRESENCE OF ULCERS (sore throat, sores in mouth, or a toothache) UNUSUAL RASH, SWELLING OR PAIN  UNUSUAL VAGINAL DISCHARGE OR ITCHING   Items with * indicate a potential emergency and should be followed up as soon as possible or go to the Emergency Department if any problems should occur.  Please show the CHEMOTHERAPY ALERT CARD or IMMUNOTHERAPY ALERT CARD at check-in to the  Emergency Department and triage nurse. Should you have questions after your visit or need to cancel or reschedule your appointment, please contact [facility]  [phone] and follow the prompts.  Office hours are 8:00 a.m. to 4:30 p.m. Monday - Friday. Please note that voicemails left after 4:00 p.m. may not be returned until the following business day.  We are closed weekends and major holidays. You have access to a nurse at all times for urgent questions. Please call the main number to the clinic [phone] and follow the prompts.  For any non-urgent questions, you may also contact your provider using MyChart. We now offer e-Visits for anyone 73 and older to request care online for non-urgent symptoms. For details visit [email or web address].   Also download the MyChart app! Go to the app store, search "MyChart", open the app, select [facility], and log in with your MyChart username and password.  Due to Covid, a mask is required upon entering the hospital/clinic. If you do not have a mask, one will be given to you upon arrival. For doctor visits, patients may have 1 support person aged 73 or older with them. For treatment visits, patients cannot have anyone with them due to current Covid guidelines and our immunocompromised population. [facility]  Discharge Instructions: Thank you for choosing [facility] to provide your oncology and hematology care.   If you have a lab appointment with the [facility], please go directly to the [facility] and check in at the registration area.  Wear comfortable clothing  and clothing appropriate for easy access to any Portacath or PICC line.   We strive to give you quality time with your provider. You may need to reschedule your appointment if you arrive late (15 or more minutes).  Arriving late affects you and other patients whose appointments are after yours.  Also, if you miss three  or more appointments without notifying the office, you may be dismissed from the clinic at the providers discretion.      For prescription refill requests, have your pharmacy contact our office and allow 72 hours for refills to be completed.    Today you received the following chemotherapy and/or immunotherapy agents cisplatin, cpt-11   To help prevent nausea and vomiting after your treatment, we encourage you to take your nausea medication as directed.  BELOW ARE SYMPTOMS THAT SHOULD BE REPORTED IMMEDIATELY: *FEVER GREATER THAN 100.4 F (38 C) OR HIGHER *CHILLS OR SWEATING *NAUSEA AND VOMITING THAT IS NOT CONTROLLED WITH YOUR NAUSEA MEDICATION *UNUSUAL SHORTNESS OF BREATH *UNUSUAL BRUISING OR BLEEDING *URINARY PROBLEMS (pain or burning when urinating, or frequent urination) *BOWEL PROBLEMS (unusual diarrhea, constipation, pain near the anus) TENDERNESS IN MOUTH AND THROAT WITH OR WITHOUT PRESENCE OF ULCERS (sore throat, sores in mouth, or a toothache) UNUSUAL RASH, SWELLING OR PAIN  UNUSUAL VAGINAL DISCHARGE OR ITCHING   Items with * indicate a potential emergency and should be followed up as soon as possible or go to the Emergency Department if any problems should occur.  Please show the CHEMOTHERAPY ALERT CARD or IMMUNOTHERAPY ALERT CARD at check-in to the Emergency Department and triage nurse. Should you have questions after your visit or need to cancel or reschedule your appointment, please contact [facility]  [phone] and follow the prompts.  Office hours are 8:00 a.m. to 4:30 p.m. Monday - Friday. Please note that voicemails left after 4:00 p.m. may not be returned until the following business day.  We are closed weekends and major holidays. You have access to a nurse at all times for urgent questions. Please call the main number to the clinic [phone] and follow the prompts.  For any non-urgent questions, you may also contact your provider using  MyChart. We now offer e-Visits for anyone 73 and older to request care online for non-urgent symptoms. For details visit [email or web address].   Also download the MyChart app! Go to the app store, search "MyChart", open the app, select , and log in with your MyChart username and password.  Due to Covid, a mask is required upon entering the hospital/clinic. If you do not have a mask, one will be given to you upon arrival. For doctor visits, patients may have 1 support person aged 46 or older with them. For treatment visits, patients cannot have anyone with them due to current Covid guidelines and our immunocompromised population.

## 2021-01-04 NOTE — Patient Instructions (Signed)

## 2021-01-04 NOTE — Progress Notes (Signed)
Hematology and Oncology Follow Up Visit  [name] [id number] 09/23/1945 75 y.o. 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 Iron def anemia -- malabsorption   Past Therapy: Zytiga 1000 mg by mouth daily - discontinued on 08/15/2016 Xtandi 160 mg po q day - start on 08/15/2016 - discontinued Radium-223 therapy -- s/p cycle #6 Palliative radiation to the right sacrum Lutathera - s/p cycle 4/4 --last dose on 07/11/2017 Carbo/VP-16/Tecentriq --  S/p cycle #5 Keytruda q 6 week -- Maintanence -- s/p cycle #3 - changed from 3 to 6 week on 06/30/2018 Taxotere/Keytruda -- start on 09/17/2018 -- s/p cycle #10 -- d/c due to progression   Current Therapy:        Lurbinectedin -- started on 04/29/2019, s/p cycle 17 -- d/c on 05/2020 due to progression CDDP/Irinotecan -- s/p cycle #6-- start on 06/15/2020 Xgeva 120 mg subcutaneous Q3 months - due in 01/2021 Lupron 22 mg IM every 3 months - due in 01/2021  SBRT to sacral lesion  IV iron as indicated    Interim History:  [name] is here today for follow-up.  He is getting ready for Christmas.  He had a nice Thanksgiving with his family.  He does feel little bit tired.  His weight is down a little bit.  He thinks his appetite is doing pretty well.  There is been a little bit of diarrhea.  His hemoglobin was 8 today.  I think we will not have to transfuse him.  I would like for him to feel well for Christmas.  His birthday is next [birth year].  I will not treat him on his birthday.  We will move his treatment back a couple days.  He has had no cough.  He has had no problems after having COVID this past summer.  He has had no leg swelling.  He has had no headache.  He has had no hematuria.  He says he has little bit of hematochezia when he has diarrhea.  Overall, I would say his performance status is ECOG 1.    Medications:   Allergies as of 01/04/2021       Reactions   Codeine Nausea And Vomiting   Hydrocodone Nausea And Vomiting        Medication List        Accurate as of January 04, 2021  8:13 AM. If you have any questions, ask your nurse or doctor.          acetaminophen 500 MG tablet Commonly known as: TYLENOL Take 1 tablet (500 mg total) by mouth every 4 (four) hours as needed for mild pain, headache or fever (fever >/= 101).   Anti-Diarrheal 2 MG tablet Generic drug: loperamide Take 2 tablets by mouth at diarrhea onset, then 1 tablet every 2 hours until 12 hours with no bowel movment. May take 2 tablets every 4 hours at night. If diarrhea recurs repeat. What changed:  how much to take how to take this when to take this reasons to take this   ascorbic acid 500 MG tablet Commonly known as: VITAMIN C Take 1 tablet (500 mg total) by mouth daily.   aspirin 81 MG tablet Take 81 mg by mouth daily.   dexamethasone 4 MG tablet Commonly known as: DECADRON Take 4 mg by mouth daily. X 3 days   dexamethasone 4 MG tablet Commonly known as: DECADRON Take 2 tablets (8 mg total) by  mouth daily for 3 days starting the day after cisplatin chemotherapy. Take with food.   ezetimibe 10 MG tablet Commonly known as: ZETIA TAKE 1 TABLET BY MOUTH DAILY What changed: how much to take   ezetimibe 10 MG tablet Commonly known as: ZETIA Take 1 tablet by mouth daily What changed: Another medication with the same name was changed. Make sure you understand how and when to take each.   Generlac 10 GM/15ML Soln Generic drug: lactulose (encephalopathy) Take 15 mLs (10 g total) by mouth daily as needed for constipation.   isosorbide mononitrate 30 MG 24 hr tablet Commonly known as: IMDUR TAKE 1 TABLET (30 MG TOTAL) BY MOUTH DAILY.   lidocaine-prilocaine cream Commonly known as: EMLA Apply to affected area once What changed:  how much to take how to take this when to take this additional  instructions   ondansetron 8 MG tablet Commonly known as: Zofran Take 1 tablet (8 mg total) by mouth 2 (two) times daily as needed. Start on the third day after cisplatin chemotherapy. What changed: reasons to take this   OVER THE COUNTER MEDICATION 1 each by Other route See admin instructions. Juice Plus -- 8 capsule of each the garden, vineyard, and orchard twice a day.   pantoprazole 40 MG tablet Commonly known as: PROTONIX TAKE 1 TABLET (40 MG TOTAL) BY MOUTH AT BEDTIME. What changed: how much to take   prochlorperazine 10 MG tablet Commonly known as: COMPAZINE Take 1 tablet (10 mg total) by mouth every 6 (six) hours as needed for nausea or vomiting.   solifenacin 10 MG tablet Commonly known as: VESIcare Take 1 tablet (10 mg total) by mouth daily.   Zinc 50 MG Caps Take 50 mg by mouth daily.        Allergies:  Allergies  Allergen Reactions   Codeine Nausea And Vomiting   Hydrocodone Nausea And Vomiting    Past Medical History, Surgical history, Social history, and Family History were reviewed and updated.  Review of Systems: Review of Systems  Constitutional: Negative.   HENT: Negative.    Eyes: Negative.   Respiratory: Negative.    Cardiovascular: Negative.   Gastrointestinal: Negative.   Genitourinary: Negative.   Musculoskeletal:  Positive for joint pain.  Skin: Negative.   Neurological:  Positive for focal weakness.  Endo/Heme/Allergies: Negative.   Psychiatric/Behavioral: Negative.      Physical Exam:  vitals were not taken for this visit.   Wt Readings from Last 3 Encounters:  12/14/20 151 lb 0.6 oz (68.5 kg)  11/30/20 152 lb (68.9 kg)  11/03/20 157 lb (71.2 kg)    Physical Exam Vitals reviewed.  HENT:     Head: Normocephalic and atraumatic.  Eyes:     Pupils: Pupils are equal, round, and reactive to light.  Cardiovascular:     Rate and Rhythm: Normal rate and regular rhythm.     Heart sounds: Normal heart sounds.  Pulmonary:      Effort: Pulmonary effort is normal.     Breath sounds: Normal breath sounds.  Abdominal:     General: Bowel sounds are normal.     Palpations: Abdomen is soft.  Musculoskeletal:        General: No tenderness or deformity. Normal range of motion.     Cervical back: Normal range of motion.  Lymphadenopathy:     Cervical: No cervical adenopathy.  Skin:    General: Skin is warm and dry.     Findings: No erythema or rash.  Neurological:     Mental Status: He is alert and oriented to person, place, and time.  Psychiatric:        Behavior: Behavior normal.        Thought Content: Thought content normal.        Judgment: Judgment normal.   Lab Results  Component Value Date   WBC 9.5 12/14/2020   HGB 9.2 (L) 12/14/2020   HCT 28.1 (L) 12/14/2020   MCV 104.5 (H) 12/14/2020   PLT 277 12/14/2020   Lab Results  Component Value Date   FERRITIN 828 (H) 11/30/2020   IRON 103 11/30/2020   TIBC 223 11/30/2020   UIBC 120 11/30/2020   IRONPCTSAT 46 11/30/2020   Lab Results  Component Value Date   RETICCTPCT 1.3 11/30/2020   RBC 2.69 (L) 12/14/2020   No results found for: KPAFRELGTCHN, LAMBDASER, KAPLAMBRATIO No results found for: IGGSERUM, IGA, IGMSERUM No results found for: Kathrynn Ducking, MSPIKE, SPEI   Chemistry      Component Value Date/Time   NA 135 12/14/2020 0859   NA 139 10/19/2020 1444   NA 144 12/14/2016 1159   NA 140 10/22/2016 1129   K 4.2 12/14/2020 0859   K 3.5 12/14/2016 1159   K 4.3 10/22/2016 1129   CL 101 12/14/2020 0859   CL 103 12/14/2016 1159   CO2 26 12/14/2020 0859   CO2 30 12/14/2016 1159   CO2 27 10/22/2016 1129   BUN 21 12/14/2020 0859   BUN 17 10/19/2020 1444   BUN 16 12/14/2016 1159   BUN 14.0 10/22/2016 1129   CREATININE 1.57 (H) 12/14/2020 0859   CREATININE 1.3 (H) 12/14/2016 1159   CREATININE 1.1 10/22/2016 1129      Component Value Date/Time   CALCIUM 10.1 12/14/2020 0859   CALCIUM 10.0  12/14/2016 1159   CALCIUM 10.4 10/22/2016 1129   ALKPHOS 93 12/14/2020 0859   ALKPHOS 48 12/14/2016 1159   ALKPHOS 60 10/22/2016 1129   AST 11 (L) 12/14/2020 0859   AST 16 10/22/2016 1129   ALT 8 12/14/2020 0859   ALT 23 12/14/2016 1159   ALT 14 10/22/2016 1129   BILITOT 0.3 12/14/2020 0859   BILITOT 0.31 10/22/2016 1129       Impression and Plan: [name] is 75 yo caucasian gentleman with metastatic neuroendocrine carcinoma of unknown primary.    We will go ahead and give him his 7th cycle of treatment.  After this cycle, we will go ahead and do a scan on him.  We get the nuclear medicine neuroendocrine scan that really helps [place] out.  Again, we will transfuse him.  I do think this will be quite helpful.  I again I want him to have a wonderful Christmas and feel good.  I will plan to see him back in January.  With his blood counts, we will move his treatments back a little bit.     12/21/20228:13 AM

## 2021-01-04 NOTE — Progress Notes (Unsigned)
Ok to treat with Creatinine of 1.67  All labs reviewed with dr Marin Olp.  Ok to treat today

## 2021-01-05 ENCOUNTER — Inpatient Hospital Stay: Payer: Medicare Other

## 2021-01-05 DIAGNOSIS — Z5111 Encounter for antineoplastic chemotherapy: Secondary | ICD-10-CM | POA: Diagnosis not present

## 2021-01-05 DIAGNOSIS — D508 Other iron deficiency anemias: Secondary | ICD-10-CM | POA: Diagnosis not present

## 2021-01-05 DIAGNOSIS — C7A8 Other malignant neuroendocrine tumors: Secondary | ICD-10-CM

## 2021-01-05 DIAGNOSIS — C7951 Secondary malignant neoplasm of bone: Secondary | ICD-10-CM

## 2021-01-05 DIAGNOSIS — D649 Anemia, unspecified: Secondary | ICD-10-CM

## 2021-01-05 DIAGNOSIS — R197 Diarrhea, unspecified: Secondary | ICD-10-CM | POA: Diagnosis not present

## 2021-01-05 DIAGNOSIS — Z5189 Encounter for other specified aftercare: Secondary | ICD-10-CM | POA: Diagnosis not present

## 2021-01-05 DIAGNOSIS — C61 Malignant neoplasm of prostate: Secondary | ICD-10-CM | POA: Diagnosis not present

## 2021-01-05 DIAGNOSIS — K909 Intestinal malabsorption, unspecified: Secondary | ICD-10-CM | POA: Diagnosis not present

## 2021-01-05 LAB — CHROMOGRANIN A: Chromogranin A (ng/mL): 598.9 ng/mL — ABNORMAL HIGH (ref 0.0–101.8)

## 2021-01-05 MED ORDER — FUROSEMIDE 10 MG/ML IJ SOLN: 20.0000 mg | Freq: Once | INTRAMUSCULAR | Status: DC

## 2021-01-05 MED ORDER — SODIUM CHLORIDE 0.9% FLUSH
10.0000 mL | Freq: Once | INTRAVENOUS | Status: AC
  Administered 2021-01-05: 13:00:00 10 mL via INTRAVENOUS

## 2021-01-05 MED ORDER — ACETAMINOPHEN 325 MG PO TABS
650.0000 mg | ORAL_TABLET | Freq: Once | ORAL | Status: AC
  Administered 2021-01-05: 08:00:00 650 mg via ORAL
  Filled 2021-01-05: qty 2

## 2021-01-05 MED ORDER — DIPHENHYDRAMINE HCL 25 MG PO CAPS
25.0000 mg | ORAL_CAPSULE | Freq: Once | ORAL | Status: AC
  Administered 2021-01-05: 08:00:00 25 mg via ORAL
  Filled 2021-01-05: qty 1

## 2021-01-05 MED ORDER — HEPARIN SOD (PORK) LOCK FLUSH 100 UNIT/ML IV SOLN
500.0000 [IU] | Freq: Once | INTRAVENOUS | Status: AC
  Administered 2021-01-05: 13:00:00 500 [IU] via INTRAVENOUS

## 2021-01-05 NOTE — Patient Instructions (Signed)
Blood Transfusion, Adult, Care After This sheet gives you information about how to care for yourself after your procedure. Your doctor may also give you more specific instructions. If you have problems or questions, contact your doctor. What can I expect after the procedure? After the procedure, it is common to have: Bruising and soreness at the IV site. A headache. Follow these instructions at home: Insertion site care   Follow instructions from your doctor about how to take care of your insertion site. This is where an IV tube was put into your vein. Make sure you: Wash your hands with soap and water before and after you change your bandage (dressing). If you cannot use soap and water, use hand sanitizer. Change your bandage as told by your doctor. Check your insertion site every day for signs of infection. Check for: Redness, swelling, or pain. Bleeding from the site. Warmth. Pus or a bad smell. General instructions Take over-the-counter and prescription medicines only as told by your doctor. Rest as told by your doctor. Go back to your normal activities as told by your doctor. Keep all follow-up visits as told by your doctor. This is important. Contact a doctor if: You have itching or red, swollen areas of skin (hives). You feel worried or nervous (anxious). You feel weak after doing your normal activities. You have redness, swelling, warmth, or pain around the insertion site. You have blood coming from the insertion site, and the blood does not stop with pressure. You have pus or a bad smell coming from the insertion site. Get help right away if: You have signs of a serious reaction. This may be coming from an allergy or the body's defense system (immune system). Signs include: Trouble breathing or shortness of breath. Swelling of the face or feeling warm (flushed). Fever or chills. Head, chest, or back pain. Dark pee (urine) or blood in the pee. Widespread rash. Fast  heartbeat. Feeling dizzy or light-headed. You may receive your blood transfusion in an outpatient setting. If so, you will be told whom to contact to report any reactions. These symptoms may be an emergency. Do not wait to see if the symptoms will go away. Get medical help right away. Call your local emergency services (911 in the [place] Do not drive yourself to the hospital. Summary Bruising and soreness at the IV site are common. Check your insertion site every day for signs of infection. Rest as told by your doctor. Go back to your normal activities as told by your doctor. Get help right away if you have signs of a serious reaction. This information is not intended to replace advice given to you by your health care provider. Make sure you discuss any questions you have with your health care provider. Document Revised: 04/28/2020 Document Reviewed: 06/26/2018 Elsevier Patient Education  2022 Elsevier Inc.  

## 2021-01-06 LAB — TYPE AND SCREEN
ABO/RH(D): O POS
Antibody Screen: NEGATIVE
Unit division: 0
Unit division: 0

## 2021-01-06 LAB — BPAM RBC
Blood Product Expiration Date: 202301142359
Blood Product Expiration Date: 202301142359
ISSUE DATE / TIME: 202212220757
ISSUE DATE / TIME: 202212220757
Unit Type and Rh: 5100
Unit Type and Rh: 5100

## 2021-01-11 ENCOUNTER — Inpatient Hospital Stay: Payer: Medicare Other

## 2021-01-11 ENCOUNTER — Ambulatory Visit: Payer: Medicare Other

## 2021-01-11 ENCOUNTER — Ambulatory Visit: Payer: Medicare Other | Admitting: Hematology & Oncology

## 2021-01-11 ENCOUNTER — Other Ambulatory Visit: Payer: Medicare Other

## 2021-01-12 ENCOUNTER — Other Ambulatory Visit: Payer: Self-pay | Admitting: *Deleted

## 2021-01-12 DIAGNOSIS — C7951 Secondary malignant neoplasm of bone: Secondary | ICD-10-CM

## 2021-01-12 DIAGNOSIS — C7A8 Other malignant neuroendocrine tumors: Secondary | ICD-10-CM

## 2021-01-12 DIAGNOSIS — D649 Anemia, unspecified: Secondary | ICD-10-CM

## 2021-01-13 ENCOUNTER — Inpatient Hospital Stay: Payer: Medicare Other

## 2021-01-13 ENCOUNTER — Other Ambulatory Visit: Payer: Self-pay

## 2021-01-13 VITALS — BP 133/62 | HR 89 | Temp 98.9°F | Resp 19

## 2021-01-13 DIAGNOSIS — R197 Diarrhea, unspecified: Secondary | ICD-10-CM | POA: Diagnosis not present

## 2021-01-13 DIAGNOSIS — Z5111 Encounter for antineoplastic chemotherapy: Secondary | ICD-10-CM | POA: Diagnosis not present

## 2021-01-13 DIAGNOSIS — C7951 Secondary malignant neoplasm of bone: Secondary | ICD-10-CM

## 2021-01-13 DIAGNOSIS — C61 Malignant neoplasm of prostate: Secondary | ICD-10-CM | POA: Diagnosis not present

## 2021-01-13 DIAGNOSIS — Z5189 Encounter for other specified aftercare: Secondary | ICD-10-CM | POA: Diagnosis not present

## 2021-01-13 DIAGNOSIS — D649 Anemia, unspecified: Secondary | ICD-10-CM

## 2021-01-13 DIAGNOSIS — C7A8 Other malignant neuroendocrine tumors: Secondary | ICD-10-CM

## 2021-01-13 DIAGNOSIS — D508 Other iron deficiency anemias: Secondary | ICD-10-CM | POA: Diagnosis not present

## 2021-01-13 DIAGNOSIS — K909 Intestinal malabsorption, unspecified: Secondary | ICD-10-CM | POA: Diagnosis not present

## 2021-01-13 LAB — CMP (CANCER CENTER ONLY)
ALT: 9 U/L (ref 0–44)
AST: 11 U/L — ABNORMAL LOW (ref 15–41)
Albumin: 4 g/dL (ref 3.5–5.0)
Alkaline Phosphatase: 113 U/L (ref 38–126)
Anion gap: 9 (ref 5–15)
BUN: 23 mg/dL (ref 8–23)
CO2: 26 mmol/L (ref 22–32)
Calcium: 9.5 mg/dL (ref 8.9–10.3)
Chloride: 103 mmol/L (ref 98–111)
Creatinine: 1.52 mg/dL — ABNORMAL HIGH (ref 0.61–1.24)
GFR, Estimated: 48 mL/min — ABNORMAL LOW (ref >60)
Glucose, Bld: 92 mg/dL (ref 70–99)
Potassium: 4.1 mmol/L (ref 3.5–5.1)
Sodium: 138 mmol/L (ref 135–145)
Total Bilirubin: 0.3 mg/dL (ref 0.3–1.2)
Total Protein: 6.1 g/dL — ABNORMAL LOW (ref 6.5–8.1)

## 2021-01-13 LAB — CBC WITH DIFFERENTIAL (CANCER CENTER ONLY)
Abs Immature Granulocytes: 0.31 10*3/uL — ABNORMAL HIGH (ref 0.00–0.07)
Basophils Absolute: 0.1 10*3/uL (ref 0.0–0.1)
Basophils Relative: 1 %
Eosinophils Absolute: 0.1 10*3/uL (ref 0.0–0.5)
Eosinophils Relative: 1 %
HCT: 32.6 % — ABNORMAL LOW (ref 39.0–52.0)
Hemoglobin: 10.7 g/dL — ABNORMAL LOW (ref 13.0–17.0)
Immature Granulocytes: 2 %
Lymphocytes Relative: 9 %
Lymphs Abs: 1.2 10*3/uL (ref 0.7–4.0)
MCH: 32.7 pg (ref 26.0–34.0)
MCHC: 32.8 g/dL (ref 30.0–36.0)
MCV: 99.7 fL (ref 80.0–100.0)
Monocytes Absolute: 1.3 10*3/uL — ABNORMAL HIGH (ref 0.1–1.0)
Monocytes Relative: 9 %
Neutro Abs: 11.3 10*3/uL — ABNORMAL HIGH (ref 1.7–7.7)
Neutrophils Relative %: 78 %
Platelet Count: 175 10*3/uL (ref 150–400)
RBC: 3.27 MIL/uL — ABNORMAL LOW (ref 4.22–5.81)
RDW: 17.2 % — ABNORMAL HIGH (ref 11.5–15.5)
Smear Review: NORMAL
WBC Count: 14.3 10*3/uL — ABNORMAL HIGH (ref 4.0–10.5)
nRBC: 0 % (ref 0.0–0.2)

## 2021-01-13 LAB — MAGNESIUM: Magnesium: 1.8 mg/dL (ref 1.7–2.4)

## 2021-01-13 MED ORDER — ATROPINE SULFATE 1 MG/ML IV SOLN
0.5000 mg | Freq: Once | INTRAVENOUS | Status: AC | PRN
  Administered 2021-01-13: 11:00:00 0.5 mg via INTRAVENOUS
  Filled 2021-01-13: qty 1

## 2021-01-13 MED ORDER — MAGNESIUM SULFATE 2 GM/50ML IV SOLN
2.0000 g | Freq: Once | INTRAVENOUS | Status: AC
  Administered 2021-01-13: 09:00:00 2 g via INTRAVENOUS
  Filled 2021-01-13: qty 50

## 2021-01-13 MED ORDER — SODIUM CHLORIDE 0.9 % IV SOLN
65.0000 mg/m2 | Freq: Once | INTRAVENOUS | Status: AC
  Administered 2021-01-13: 12:00:00 120 mg via INTRAVENOUS
  Filled 2021-01-13: qty 6

## 2021-01-13 MED ORDER — SODIUM CHLORIDE 0.9 % IV SOLN
10.0000 mg | Freq: Once | INTRAVENOUS | Status: AC
  Administered 2021-01-13: 11:00:00 10 mg via INTRAVENOUS
  Filled 2021-01-13: qty 10

## 2021-01-13 MED ORDER — POTASSIUM CHLORIDE IN NACL 20-0.9 MEQ/L-% IV SOLN
Freq: Once | INTRAVENOUS | Status: AC
  Filled 2021-01-13: qty 1000

## 2021-01-13 MED ORDER — SODIUM CHLORIDE 0.9 % IV SOLN
150.0000 mg | Freq: Once | INTRAVENOUS | Status: AC
  Administered 2021-01-13: 11:00:00 150 mg via INTRAVENOUS
  Filled 2021-01-13: qty 150

## 2021-01-13 MED ORDER — SODIUM CHLORIDE 0.9 % IV SOLN
27.0000 mg/m2 | Freq: Once | INTRAVENOUS | Status: AC
  Administered 2021-01-13: 14:00:00 50 mg via INTRAVENOUS
  Filled 2021-01-13: qty 50

## 2021-01-13 MED ORDER — PALONOSETRON HCL INJECTION 0.25 MG/5ML
0.2500 mg | Freq: Once | INTRAVENOUS | Status: AC
  Administered 2021-01-13: 11:00:00 0.25 mg via INTRAVENOUS
  Filled 2021-01-13: qty 5

## 2021-01-13 MED ORDER — HEPARIN SOD (PORK) LOCK FLUSH 100 UNIT/ML IV SOLN
500.0000 [IU] | Freq: Once | INTRAVENOUS | Status: AC | PRN
  Administered 2021-01-13: 15:00:00 500 [IU]

## 2021-01-13 MED ORDER — SODIUM CHLORIDE 0.9% FLUSH
10.0000 mL | INTRAVENOUS | Status: DC | PRN
  Administered 2021-01-13: 15:00:00 10 mL

## 2021-01-13 MED ORDER — SODIUM CHLORIDE 0.9 % IV SOLN: Freq: Once | INTRAVENOUS | Status: AC

## 2021-01-13 NOTE — Progress Notes (Signed)
Ok to treat with Creatinine of 1.52 per Dr Marin Olp.

## 2021-01-13 NOTE — Patient Instructions (Signed)

## 2021-01-17 ENCOUNTER — Other Ambulatory Visit: Payer: Self-pay | Admitting: *Deleted

## 2021-01-17 ENCOUNTER — Other Ambulatory Visit (HOSPITAL_BASED_OUTPATIENT_CLINIC_OR_DEPARTMENT_OTHER): Payer: Self-pay

## 2021-01-17 MED ORDER — AMOXICILLIN 500 MG PO TABS
2000.0000 mg | ORAL_TABLET | Freq: Once | ORAL | 0 refills | Status: AC
  Filled 2021-01-17: qty 4, 1d supply, fill #0

## 2021-01-19 ENCOUNTER — Other Ambulatory Visit (HOSPITAL_BASED_OUTPATIENT_CLINIC_OR_DEPARTMENT_OTHER): Payer: Self-pay

## 2021-01-25 ENCOUNTER — Other Ambulatory Visit: Payer: Self-pay

## 2021-01-25 DIAGNOSIS — M1712 Unilateral primary osteoarthritis, left knee: Secondary | ICD-10-CM | POA: Insufficient documentation

## 2021-01-26 DIAGNOSIS — M9901 Segmental and somatic dysfunction of cervical region: Secondary | ICD-10-CM | POA: Diagnosis not present

## 2021-01-26 DIAGNOSIS — M9904 Segmental and somatic dysfunction of sacral region: Secondary | ICD-10-CM | POA: Diagnosis not present

## 2021-01-26 DIAGNOSIS — M9902 Segmental and somatic dysfunction of thoracic region: Secondary | ICD-10-CM | POA: Diagnosis not present

## 2021-01-26 DIAGNOSIS — M9903 Segmental and somatic dysfunction of lumbar region: Secondary | ICD-10-CM | POA: Diagnosis not present

## 2021-01-27 ENCOUNTER — Other Ambulatory Visit (HOSPITAL_BASED_OUTPATIENT_CLINIC_OR_DEPARTMENT_OTHER): Payer: Self-pay

## 2021-01-27 DIAGNOSIS — L03031 Cellulitis of right toe: Secondary | ICD-10-CM | POA: Diagnosis not present

## 2021-01-27 MED ORDER — CLINDAMYCIN HCL 300 MG PO CAPS
ORAL_CAPSULE | ORAL | 0 refills | Status: DC
  Filled 2021-01-27: qty 30, 10d supply, fill #0

## 2021-01-31 ENCOUNTER — Other Ambulatory Visit: Payer: Self-pay

## 2021-01-31 ENCOUNTER — Encounter (INDEPENDENT_AMBULATORY_CARE_PROVIDER_SITE_OTHER): Payer: Self-pay

## 2021-02-01 ENCOUNTER — Telehealth: Payer: Self-pay

## 2021-02-02 ENCOUNTER — Encounter: Admission: RE | Payer: Self-pay | Source: Ambulatory Visit

## 2021-02-02 ENCOUNTER — Ambulatory Visit
Admission: RE | Admit: 2021-02-02 | Payer: Medicare Other | Source: Ambulatory Visit | Admitting: Adult Reconstructive Orthopaedic Surgery

## 2021-02-02 ENCOUNTER — Encounter: Payer: Self-pay | Admitting: Cardiology

## 2021-02-02 ENCOUNTER — Other Ambulatory Visit: Payer: Self-pay

## 2021-02-02 ENCOUNTER — Ambulatory Visit (INDEPENDENT_AMBULATORY_CARE_PROVIDER_SITE_OTHER): Payer: Medicare Other | Admitting: Cardiology

## 2021-02-02 VITALS — BP 126/66 | HR 100 | Ht 68.0 in | Wt 147.0 lb

## 2021-02-02 DIAGNOSIS — M1712 Unilateral primary osteoarthritis, left knee: Secondary | ICD-10-CM

## 2021-02-02 DIAGNOSIS — E785 Hyperlipidemia, unspecified: Secondary | ICD-10-CM

## 2021-02-02 DIAGNOSIS — I10 Essential (primary) hypertension: Secondary | ICD-10-CM

## 2021-02-02 DIAGNOSIS — C7951 Secondary malignant neoplasm of bone: Secondary | ICD-10-CM | POA: Diagnosis not present

## 2021-02-02 SURGERY — ARTHROPLASTY, KNEE, TOTAL
Anesthesia: Choice | Site: Knee | Laterality: Left

## 2021-02-02 NOTE — Progress Notes (Signed)
Cardiology Office Note:    Date:  02/02/2021   ID:  [name], DOB 10-17-1945, MRN [id number]  PCP:  Nicoletta Dress, MD  Cardiologist:  Jenne Campus, MD    Referring MD: Nicoletta Dress, MD   Chief Complaint  Patient presents with   Follow-up  I am doing well  History of Present Illness:    [name] is a 76 y.o. 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  Last time when I seen him he complained of having some episode of shortness of breath we did proBNP which was normal, echocardiogram being performed which was normal.  Since that time he is asymptomatic.  He denies to have any shortness of breath chest pain tightness squeezing pressure burning chest.  He seems to be quite optimistic.  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 Intolerant to multiple statins   Dyspnea    with exertion   Essential hypertension 07/12/2014   Goals of care, counseling/discussion 01/29/2018   High blood sugar    History of radiation therapy 04/13/20-04/27/20   SBRT right sacrum - Dr. [name]   History of radiation therapy 04/22/2014-05/06/2014   right sacrum   Dr [name]   History of radiation therapy 10/26/2015-11/09/2015   right sacro-iliac    Dr [name]   Iron deficiency anemia due to chronic blood loss 02/04/2019   Ischemic chest pain ([name]) 07/12/2014   Formatting of this note might be different from the original. Minimally abnormal stress test but does  not want to do cardiac catheterization, asymptomatic.   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   Type 2 diabetes mellitus ([name]) 03/23/2018    Past Surgical History:  Procedure Laterality Date   CIRCUMCISION     when patient was 76 years old   COLONOSCOPY     COLONOSCOPY W/ POLYPECTOMY     ESOPHAGOGASTRODUODENOSCOPY     HAND SURGERY Left    fracture   IR IMAGING GUIDED PORT INSERTION  02/10/2018   LUMBAR LAMINECTOMY/DECOMPRESSION MICRODISCECTOMY Right 09/12/2016   Procedure: LAMINECTOMY AND FORAMINOTOMY RIGHT LUMBAR FIVE- SACRAL ONE, SACRAL ONE- SACRAL TWO DECOMPRESSION OF RIGHT SACRAL ONE NERVE ROOT;  Surgeon: [name], MD;  Location: [facility];  Service: Neurosurgery;  Laterality: Right;   ROBOT ASSISTED LAPAROSCOPIC RADICAL PROSTATECTOMY     TONSILECTOMY/ADENOIDECTOMY WITH MYRINGOTOMY      Current Medications: Current Meds  Medication Sig   acetaminophen (TYLENOL) 500 MG tablet Take 1 tablet (500 mg total) by mouth every 4 (four) hours as needed for mild pain, headache or fever (fever >/= 101).   ascorbic acid (  VITAMIN C) 500 MG tablet Take 1 tablet (500 mg total) by mouth daily.   aspirin 81 MG tablet Take 81 mg by mouth daily.   clindamycin (CLEOCIN) 300 MG capsule Take 1 capsule by mouth 3 times daily with food (Patient taking differently: Take 300 mg by mouth 2 (two) times daily.)   dexamethasone (DECADRON) 4 MG tablet Take 4 mg by mouth daily. X 3 days   ezetimibe (ZETIA) 10 MG tablet Take 1 tablet by mouth daily   GENERLAC 10 GM/15ML SOLN Take 15 mLs (10 g total) by mouth daily as needed for  constipation.   isosorbide mononitrate (IMDUR) 30 MG 24 hr tablet TAKE 1 TABLET (30 MG TOTAL) BY MOUTH DAILY. (Patient taking differently: Take 30 mg by mouth every 8 (eight) hours as needed.)   lidocaine-prilocaine (EMLA) cream Apply to affected area once (Patient taking differently: Apply 1 application topically once. Apply to affected area once used during chemo treatment)   loperamide (IMODIUM A-D) 2 MG tablet Take 2 tablets by mouth at diarrhea onset, then 1 tablet every 2 hours until 12 hours with no bowel movment. May take 2 tablets every 4 hours at night. If diarrhea recurs repeat. (Patient taking differently: Take 4 mg by mouth 4 (four) times daily as needed for diarrhea or loose stools. Take 2 tablets by mouth at diarrhea onset, then 1 tablet every 2 hours until 12 hours with no bowel movment. May take 2 tablets every 4 hours at night. If diarrhea recurs repeat.)   ondansetron (ZOFRAN) 8 MG tablet Take 1 tablet (8 mg total) by mouth 2 (two) times daily as needed. Start on the third day after cisplatin chemotherapy.   OVER THE COUNTER MEDICATION 1 each by Other route See admin instructions. Juice Plus -- 8 capsule of each the garden, vineyard, and orchard twice a day.   pantoprazole (PROTONIX) 40 MG tablet TAKE 1 TABLET (40 MG TOTAL) BY MOUTH AT BEDTIME. (Patient taking differently: Take 40 mg by mouth at bedtime.)   prochlorperazine (COMPAZINE) 10 MG tablet Take 1 tablet (10 mg total) by mouth every 6 (six) hours as needed for nausea or vomiting.   solifenacin (VESICARE) 10 MG tablet Take 1 tablet (10 mg total) by mouth daily.   Zinc 50 MG CAPS Take 50 mg by mouth daily.     Allergies:   Codeine and Hydrocodone   Social History   Socioeconomic History   Marital status: Married    Spouse name: Not on file   Number of children: 2   Years of education: Not on file   Highest education level: Not on file  Occupational History   Occupation: real estate aprais.    Employer: [employer]  Tobacco Use   Smoking status: Never   Smokeless tobacco: Never   Tobacco comments:    never used tobacco  Vaping Use   Vaping Use: Never used  Substance and Sexual Activity   Alcohol use: No    Alcohol/week: 0.0 standard drinks   Drug use: No   Sexual activity: Not on file  Other Topics Concern   Not on file  Social History Narrative   Not on file   Social Determinants of Health   Financial Resource Strain: Not on file  Food Insecurity: Not on file  Transportation Needs: Not on file  Physical Activity: Not on file  Stress: Not on file  Social Connections: Not on file     Family History: The patient's family history includes  Heart disease in his father and mother; Prostate cancer in his father. ROS:   Please see the history of present illness.    All 14 point review of systems negative except as described per history of present illness  EKGs/Labs/Other Studies Reviewed:      Recent Labs: 10/19/2020: NT-Pro BNP 375 01/13/2021: ALT 9; BUN 23; Creatinine 1.52; Hemoglobin 10.7; Magnesium 1.8; Platelet Count 175; Potassium 4.1; Sodium 138  Recent Lipid Panel    Component Value Date/Time   CHOL 230 (H) 03/23/2018 1847   TRIG 78 05/23/2020 2052   HDL 46 03/23/2018 1847   CHOLHDL 5.0 03/23/2018 1847   VLDL 42 (H) 03/23/2018 1847   LDLCALC 142 (H) 03/23/2018 1847    Physical Exam:    VS:  BP 126/66 (BP Location: Right Arm, Patient Position: Sitting)    Pulse 100    Ht 5\' 8"  (1.727 m)    Wt 147 lb (66.7 kg)    SpO2 98%    BMI 22.35 kg/m     Wt Readings from Last 3 Encounters:  02/02/21 147 lb (66.7 kg)  01/05/21 154 lb (69.9 kg)  01/04/21 144 lb (65.3 kg)     GEN:  Well nourished, well developed in no acute distress HEENT: Normal NECK: No JVD; No carotid bruits LYMPHATICS: No lymphadenopathy CARDIAC: RRR, no murmurs, no rubs, no gallops RESPIRATORY:  Clear to auscultation without rales, wheezing or rhonchi  ABDOMEN: Soft, non-tender,  non-distended MUSCULOSKELETAL:  No edema; No deformity  SKIN: Warm and dry LOWER EXTREMITIES: no swelling NEUROLOGIC:  Alert and oriented x 3 PSYCHIATRIC:  Normal affect   ASSESSMENT:    1. Essential hypertension   2. Dyslipidemia   3. Cancer, metastatic to bone [facility])    PLAN:    In order of problems listed above:  Essential hypertension blood pressure well controlled continue present medications. Dyslipidemia I did review his K PN which show very high LDL.  He is unable to tolerate any of the medication we gave him and he said he simply just want to continue present medication does not want to add anything. Metastatic cancer to the bone.  Noted, he has been excellently followed by our oncology team and doing well in spite of advance disease   Medication Adjustments/Labs and Tests Ordered: Current medicines are reviewed at length with the patient today.  Concerns regarding medicines are outlined above.  No orders of the defined types were placed in this encounter.  Medication changes: No orders of the defined types were placed in this encounter.   Signed, Park Liter, MD, [facility] 02/02/2021 11:52 AM    [place]

## 2021-02-02 NOTE — Patient Instructions (Signed)
Medication Instructions:  Your physician recommends that you continue on your current medications as directed. Please refer to the Current Medication list given to you today.  *If you need a refill on your cardiac medications before your next appointment, please call your pharmacy*   Lab Work: None If you have labs (blood work) drawn today and your tests are completely normal, you will receive your results only by: [place] (if you have MyChart) OR A paper copy in the mail If you have any lab test that is abnormal or we need to change your treatment, we will call you to review the results.   Testing/Procedures: Medication Instructions:  Your physician recommends that you continue on your current medications as directed. Please refer to the Current Medication list given to you today.  *If you need a refill on your cardiac medications before your next appointment, please call your pharmacy*   Lab Work: Your physician recommends that you have a BMET and CBC today in the office.  If you have labs (blood work) drawn today and your tests are completely normal, you will receive your results only by: [place] (if you have MyChart) OR A paper copy in the mail If you have any lab test that is abnormal or we need to change your treatment, we will call you to review the results.   Testing/Procedures: None      Follow-Up: At [facility], you and your health needs are our priority.  As part of our continuing mission to provide you with exceptional heart care, we have created designated Provider Care Teams.  These Care Teams include your primary Cardiologist (physician) and Advanced Practice Providers (APPs -  Physician Assistants and Nurse Practitioners) who all work together to provide you with the care you need, when you need it.  We recommend signing up for the patient portal called "MyChart".  Sign up information is provided on this After Visit Summary.  MyChart is used  to connect with patients for Virtual Visits (Telemedicine).  Patients are able to view lab/test results, encounter notes, upcoming appointments, etc.  Non-urgent messages can be sent to your provider as well.   To learn more about what you can do with MyChart, go to [email or web address].    Your next appointment:   6 month(s)  The format for your next appointment:   In Person  Provider:   Jenne Campus, MD    Other Instructions    Follow-Up: At [facility], you and your health needs are our priority.  As part of our continuing mission to provide you with exceptional heart care, we have created designated Provider Care Teams.  These Care Teams include your primary Cardiologist (physician) and Advanced Practice Providers (APPs -  Physician Assistants and Nurse Practitioners) who all work together to provide you with the care you need, when you need it.  We recommend signing up for the patient portal called "MyChart".  Sign up information is provided on this After Visit Summary.  MyChart is used to connect with patients for Virtual Visits (Telemedicine).  Patients are able to view lab/test results, encounter notes, upcoming appointments, etc.  Non-urgent messages can be sent to your provider as well.   To learn more about what you can do with MyChart, go to [email or web address].    Your next appointment:   6 month(s)  The format for your next appointment:   In Person  Provider:   Jenne Campus, MD    Other Instructions None

## 2021-02-08 ENCOUNTER — Inpatient Hospital Stay: Payer: Medicare Other

## 2021-02-08 ENCOUNTER — Encounter: Payer: Self-pay | Admitting: Hematology & Oncology

## 2021-02-08 ENCOUNTER — Other Ambulatory Visit (HOSPITAL_BASED_OUTPATIENT_CLINIC_OR_DEPARTMENT_OTHER): Payer: Self-pay

## 2021-02-08 ENCOUNTER — Inpatient Hospital Stay (HOSPITAL_BASED_OUTPATIENT_CLINIC_OR_DEPARTMENT_OTHER): Payer: Medicare Other | Admitting: Hematology & Oncology

## 2021-02-08 ENCOUNTER — Other Ambulatory Visit: Payer: Self-pay

## 2021-02-08 ENCOUNTER — Other Ambulatory Visit: Payer: Self-pay | Admitting: Hematology & Oncology

## 2021-02-08 ENCOUNTER — Inpatient Hospital Stay: Payer: Medicare Other | Attending: Hematology & Oncology

## 2021-02-08 VITALS — BP 140/76 | HR 97

## 2021-02-08 DIAGNOSIS — Z5111 Encounter for antineoplastic chemotherapy: Secondary | ICD-10-CM | POA: Insufficient documentation

## 2021-02-08 DIAGNOSIS — Z885 Allergy status to narcotic agent status: Secondary | ICD-10-CM | POA: Diagnosis not present

## 2021-02-08 DIAGNOSIS — R531 Weakness: Secondary | ICD-10-CM | POA: Insufficient documentation

## 2021-02-08 DIAGNOSIS — K909 Intestinal malabsorption, unspecified: Secondary | ICD-10-CM | POA: Insufficient documentation

## 2021-02-08 DIAGNOSIS — C7A8 Other malignant neuroendocrine tumors: Secondary | ICD-10-CM

## 2021-02-08 DIAGNOSIS — C7951 Secondary malignant neoplasm of bone: Secondary | ICD-10-CM

## 2021-02-08 DIAGNOSIS — C61 Malignant neoplasm of prostate: Secondary | ICD-10-CM | POA: Diagnosis not present

## 2021-02-08 DIAGNOSIS — C7B8 Other secondary neuroendocrine tumors: Secondary | ICD-10-CM

## 2021-02-08 DIAGNOSIS — D509 Iron deficiency anemia, unspecified: Secondary | ICD-10-CM | POA: Diagnosis not present

## 2021-02-08 DIAGNOSIS — M2550 Pain in unspecified joint: Secondary | ICD-10-CM | POA: Insufficient documentation

## 2021-02-08 DIAGNOSIS — M25559 Pain in unspecified hip: Secondary | ICD-10-CM | POA: Diagnosis not present

## 2021-02-08 DIAGNOSIS — Z79899 Other long term (current) drug therapy: Secondary | ICD-10-CM | POA: Insufficient documentation

## 2021-02-08 DIAGNOSIS — Z8616 Personal history of COVID-19: Secondary | ICD-10-CM | POA: Diagnosis not present

## 2021-02-08 DIAGNOSIS — T451X5A Adverse effect of antineoplastic and immunosuppressive drugs, initial encounter: Secondary | ICD-10-CM

## 2021-02-08 LAB — CBC WITH DIFFERENTIAL (CANCER CENTER ONLY)
Abs Immature Granulocytes: 0.02 10*3/uL (ref 0.00–0.07)
Basophils Absolute: 0 10*3/uL (ref 0.0–0.1)
Basophils Relative: 1 %
Eosinophils Absolute: 0.1 10*3/uL (ref 0.0–0.5)
Eosinophils Relative: 3 %
HCT: 27.6 % — ABNORMAL LOW (ref 39.0–52.0)
Hemoglobin: 9.1 g/dL — ABNORMAL LOW (ref 13.0–17.0)
Immature Granulocytes: 0 %
Lymphocytes Relative: 22 %
Lymphs Abs: 1 10*3/uL (ref 0.7–4.0)
MCH: 33.1 pg (ref 26.0–34.0)
MCHC: 33 g/dL (ref 30.0–36.0)
MCV: 100.4 fL — ABNORMAL HIGH (ref 80.0–100.0)
Monocytes Absolute: 0.8 10*3/uL (ref 0.1–1.0)
Monocytes Relative: 18 %
Neutro Abs: 2.7 10*3/uL (ref 1.7–7.7)
Neutrophils Relative %: 56 %
Platelet Count: 281 10*3/uL (ref 150–400)
RBC: 2.75 MIL/uL — ABNORMAL LOW (ref 4.22–5.81)
RDW: 17.3 % — ABNORMAL HIGH (ref 11.5–15.5)
WBC Count: 4.7 10*3/uL (ref 4.0–10.5)
nRBC: 0 % (ref 0.0–0.2)

## 2021-02-08 LAB — CMP (CANCER CENTER ONLY)
ALT: 9 U/L (ref 0–44)
AST: 13 U/L — ABNORMAL LOW (ref 15–41)
Albumin: 4.2 g/dL (ref 3.5–5.0)
Alkaline Phosphatase: 62 U/L (ref 38–126)
Anion gap: 10 (ref 5–15)
BUN: 24 mg/dL — ABNORMAL HIGH (ref 8–23)
CO2: 25 mmol/L (ref 22–32)
Calcium: 10.1 mg/dL (ref 8.9–10.3)
Chloride: 101 mmol/L (ref 98–111)
Creatinine: 1.81 mg/dL — ABNORMAL HIGH (ref 0.61–1.24)
GFR, Estimated: 39 mL/min — ABNORMAL LOW (ref >60)
Glucose, Bld: 137 mg/dL — ABNORMAL HIGH (ref 70–99)
Potassium: 4 mmol/L (ref 3.5–5.1)
Sodium: 136 mmol/L (ref 135–145)
Total Bilirubin: 0.3 mg/dL (ref 0.3–1.2)
Total Protein: 6.3 g/dL — ABNORMAL LOW (ref 6.5–8.1)

## 2021-02-08 LAB — FERRITIN: Ferritin: 1393 ng/mL — ABNORMAL HIGH (ref 24–336)

## 2021-02-08 LAB — RETICULOCYTES
Immature Retic Fract: 11 % (ref 2.3–15.9)
RBC.: 2.74 MIL/uL — ABNORMAL LOW (ref 4.22–5.81)
Retic Count, Absolute: 37.5 10*3/uL (ref 19.0–186.0)
Retic Ct Pct: 1.4 % (ref 0.4–3.1)

## 2021-02-08 LAB — IRON AND IRON BINDING CAPACITY (CC-WL,HP ONLY)
Iron: 84 ug/dL (ref 45–182)
Saturation Ratios: 34 % (ref 17.9–39.5)
TIBC: 251 ug/dL (ref 250–450)
UIBC: 167 ug/dL (ref 117–376)

## 2021-02-08 LAB — LACTATE DEHYDROGENASE: LDH: 140 U/L (ref 98–192)

## 2021-02-08 LAB — MAGNESIUM: Magnesium: 2.1 mg/dL (ref 1.7–2.4)

## 2021-02-08 MED ORDER — SODIUM CHLORIDE 0.9 % IV SOLN
65.0000 mg/m2 | Freq: Once | INTRAVENOUS | Status: AC
  Administered 2021-02-08: 13:00:00 120 mg via INTRAVENOUS
  Filled 2021-02-08: qty 6

## 2021-02-08 MED ORDER — LEUPROLIDE ACETATE (3 MONTH) 22.5 MG ~~LOC~~ KIT
22.5000 mg | PACK | Freq: Once | SUBCUTANEOUS | Status: AC
  Administered 2021-02-08: 16:00:00 22.5 mg via SUBCUTANEOUS
  Filled 2021-02-08: qty 22.5

## 2021-02-08 MED ORDER — PALONOSETRON HCL INJECTION 0.25 MG/5ML
0.2500 mg | Freq: Once | INTRAVENOUS | Status: AC
  Administered 2021-02-08: 12:00:00 0.25 mg via INTRAVENOUS
  Filled 2021-02-08: qty 5

## 2021-02-08 MED ORDER — SOLIFENACIN SUCCINATE 10 MG PO TABS
10.0000 mg | ORAL_TABLET | Freq: Every day | ORAL | 1 refills | Status: DC
  Filled 2021-02-08: qty 30, 30d supply, fill #0
  Filled 2021-03-09: qty 30, 30d supply, fill #1

## 2021-02-08 MED ORDER — PEGFILGRASTIM 6 MG/0.6ML ~~LOC~~ PSKT
6.0000 mg | PREFILLED_SYRINGE | Freq: Once | SUBCUTANEOUS | Status: AC
  Administered 2021-02-08: 16:00:00 6 mg via SUBCUTANEOUS
  Filled 2021-02-08: qty 0.6

## 2021-02-08 MED ORDER — MAGNESIUM SULFATE 2 GM/50ML IV SOLN
2.0000 g | Freq: Once | INTRAVENOUS | Status: AC
  Administered 2021-02-08: 10:00:00 2 g via INTRAVENOUS
  Filled 2021-02-08: qty 50

## 2021-02-08 MED ORDER — SODIUM CHLORIDE 0.9 % IV SOLN
27.0000 mg/m2 | Freq: Once | INTRAVENOUS | Status: AC
  Administered 2021-02-08: 14:00:00 50 mg via INTRAVENOUS
  Filled 2021-02-08: qty 50

## 2021-02-08 MED ORDER — SODIUM CHLORIDE 0.9% FLUSH
10.0000 mL | INTRAVENOUS | Status: DC | PRN
  Administered 2021-02-08: 16:00:00 10 mL

## 2021-02-08 MED ORDER — POTASSIUM CHLORIDE IN NACL 20-0.9 MEQ/L-% IV SOLN
Freq: Once | INTRAVENOUS | Status: AC
  Filled 2021-02-08: qty 1000

## 2021-02-08 MED ORDER — DENOSUMAB 120 MG/1.7ML ~~LOC~~ SOLN
120.0000 mg | Freq: Once | SUBCUTANEOUS | Status: AC
  Administered 2021-02-08: 15:00:00 120 mg via SUBCUTANEOUS
  Filled 2021-02-08: qty 1.7

## 2021-02-08 MED ORDER — SODIUM CHLORIDE 0.9 % IV SOLN
10.0000 mg | Freq: Once | INTRAVENOUS | Status: AC
  Administered 2021-02-08: 12:00:00 10 mg via INTRAVENOUS
  Filled 2021-02-08: qty 10

## 2021-02-08 MED ORDER — HEPARIN SOD (PORK) LOCK FLUSH 100 UNIT/ML IV SOLN
500.0000 [IU] | Freq: Once | INTRAVENOUS | Status: AC | PRN
  Administered 2021-02-08: 16:00:00 500 [IU]

## 2021-02-08 MED ORDER — SODIUM CHLORIDE 0.9 % IV SOLN: Freq: Once | INTRAVENOUS | Status: AC

## 2021-02-08 MED ORDER — SODIUM CHLORIDE 0.9 % IV SOLN
150.0000 mg | Freq: Once | INTRAVENOUS | Status: AC
  Administered 2021-02-08: 12:00:00 150 mg via INTRAVENOUS
  Filled 2021-02-08: qty 150

## 2021-02-08 NOTE — Patient Instructions (Signed)
[facility] [place]  Discharge Instructions: Thank you for choosing [facility] to provide your oncology and hematology care.   If you have a lab appointment with the [facility], please go directly to the [facility] and check in at the registration area.  Wear comfortable clothing and clothing appropriate for easy access to any Portacath or PICC line.   We strive to give you quality time with your provider. You may need to reschedule your appointment if you arrive late (15 or more minutes).  Arriving late affects you and other patients whose appointments are after yours.  Also, if you miss three or more appointments without notifying the office, you may be dismissed from the clinic at the providers discretion.      For prescription refill requests, have your pharmacy contact our office and allow 72 hours for refills to be completed.    Today you received the following chemotherapy and/or immunotherapy agents cpt-11, cisplatin     To help prevent nausea and vomiting after your treatment, we encourage you to take your nausea medication as directed.  BELOW ARE SYMPTOMS THAT SHOULD BE REPORTED IMMEDIATELY: *FEVER GREATER THAN 100.4 F (38 C) OR HIGHER *CHILLS OR SWEATING *NAUSEA AND VOMITING THAT IS NOT CONTROLLED WITH YOUR NAUSEA MEDICATION *UNUSUAL SHORTNESS OF BREATH *UNUSUAL BRUISING OR BLEEDING *URINARY PROBLEMS (pain or burning when urinating, or frequent urination) *BOWEL PROBLEMS (unusual diarrhea, constipation, pain near the anus) TENDERNESS IN MOUTH AND THROAT WITH OR WITHOUT PRESENCE OF ULCERS (sore throat, sores in mouth, or a toothache) UNUSUAL RASH, SWELLING OR PAIN  UNUSUAL VAGINAL DISCHARGE OR ITCHING   Items with * indicate a potential emergency and should be followed up as soon as possible or go to the Emergency Department if any problems should occur.  Please show the CHEMOTHERAPY ALERT CARD or IMMUNOTHERAPY ALERT CARD at check-in  to the Emergency Department and triage nurse. Should you have questions after your visit or need to cancel or reschedule your appointment, please contact [facility]  [phone] and follow the prompts.  Office hours are 8:00 a.m. to 4:30 p.m. Monday - Friday. Please note that voicemails left after 4:00 p.m. may not be returned until the following business day.  We are closed weekends and major holidays. You have access to a nurse at all times for urgent questions. Please call the main number to the clinic [phone] and follow the prompts.  For any non-urgent questions, you may also contact your provider using MyChart. We now offer e-Visits for anyone 56 and older to request care online for non-urgent symptoms. For details visit [email or web address].   Also download the MyChart app! Go to the app store, search "MyChart", open the app, select [facility], and log in with your MyChart username and password.  Due to Covid, a mask is required upon entering the hospital/clinic. If you do not have a mask, one will be given to you upon arrival. For doctor visits, patients may have 1 support person aged 72 or older with them. For treatment visits, patients cannot have anyone with them due to current Covid guidelines and our immunocompromised population.

## 2021-02-08 NOTE — Progress Notes (Signed)
Hematology and Oncology Follow Up Visit  [name] [id number] [birth year] 76 y.o. 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 Iron def anemia -- malabsorption   Past Therapy: Zytiga 1000 mg by mouth daily - discontinued on 08/15/2016 Xtandi 160 mg po q day - start on 08/15/2016 - discontinued Radium-223 therapy -- s/p cycle #6 Palliative radiation to the right sacrum Lutathera - s/p cycle 4/4 --last dose on 07/11/2017 Carbo/VP-16/Tecentriq --  S/p cycle #5 Keytruda q 6 week -- Maintanence -- s/p cycle #3 - changed from 3 to 6 week on 06/30/2018 Taxotere/Keytruda -- start on 09/17/2018 -- s/p cycle #10 -- d/c due to progression   Current Therapy:        Lurbinectedin -- started on 04/29/2019, s/p cycle 17 -- d/c on 05/2020 due to progression CDDP/Irinotecan -- s/p cycle #7-- start on 06/15/2020 Xgeva 120 mg subcutaneous Q3 months - due in 04/2021 Lupron 22 mg IM every 3 months - due in 04/2021  SBRT to sacral lesion  IV iron as indicated    Interim History:  [name] is here today for follow-up.  [name] did have a very nice Christmas and New Year's.  [name] was with his family.  We did transfuse him before Christmas which would make him feel a lot better.  His last chromogranin A level was down to 590.  [name] has had no problems with bleeding.  [name] has had no problems with bowels or bladder.  Has had no cough.  There is no shortness of breath.  [name] did have COVID I think back in the fall.  Again this is certainly not showing any residual effects.  His last visit we did on him today showed a ferritin of 1400 with an iron saturation of 34%.  [name] has had no issues with headaches.  Para [name] does have some hip pain.  [name] has a splint on his right lower leg.  This has been relatively stable with respect to any weakness.  Overall, I would have to say his performance status is probably  ECOG 1.   Medications:  Allergies as of 02/08/2021       Reactions   Codeine Nausea And Vomiting   Hydrocodone Nausea And Vomiting        Medication List        Accurate as of February 08, 2021  9:03 AM. If you have any questions, ask your nurse or doctor.          acetaminophen 500 MG tablet Commonly known as: TYLENOL Take 1 tablet (500 mg total) by mouth every 4 (four) hours as needed for mild pain, headache or fever (fever >/= 101).   Anti-Diarrheal 2 MG tablet Generic drug: loperamide Take 2 tablets by mouth at diarrhea onset, then 1 tablet every 2 hours until 12 hours with no bowel movment. May take 2 tablets every 4 hours at night. If diarrhea recurs repeat. What changed:  how much to take how to take this when to take this reasons to take this   ascorbic acid 500 MG tablet Commonly known as: VITAMIN C Take 1 tablet (500 mg total) by mouth daily.   aspirin 81 MG tablet Take 81 mg by mouth daily.   clindamycin 300 MG capsule Commonly known as: CLEOCIN Take 1 capsule by mouth 3 times daily with food What changed:  how much to take how to take this when to take this  dexamethasone 4 MG tablet Commonly known as: DECADRON Take 4 mg by mouth daily. X 3 days   ezetimibe 10 MG tablet Commonly known as: ZETIA Take 1 tablet by mouth daily   Generlac 10 GM/15ML Soln Generic drug: lactulose (encephalopathy) Take 15 mLs (10 g total) by mouth daily as needed for constipation.   isosorbide mononitrate 30 MG 24 hr tablet Commonly known as: IMDUR TAKE 1 TABLET (30 MG TOTAL) BY MOUTH DAILY. What changed: how much to take   lidocaine-prilocaine cream Commonly known as: EMLA Apply to affected area once What changed:  how much to take how to take this when to take this additional instructions   ondansetron 8 MG tablet Commonly known as: Zofran Take 1 tablet (8 mg total) by mouth 2 (two) times daily as needed. Start on the third day after cisplatin  chemotherapy.   OVER THE COUNTER MEDICATION 1 each by Other route See admin instructions. Juice Plus -- 8 capsule of each the garden, vineyard, and orchard twice a day.   pantoprazole 40 MG tablet Commonly known as: PROTONIX TAKE 1 TABLET (40 MG TOTAL) BY MOUTH AT BEDTIME. What changed: how much to take   prochlorperazine 10 MG tablet Commonly known as: COMPAZINE Take 1 tablet (10 mg total) by mouth every 6 (six) hours as needed for nausea or vomiting.   solifenacin 10 MG tablet Commonly known as: VESIcare Take 1 tablet (10 mg total) by mouth daily.   Zinc 50 MG Caps Take 50 mg by mouth daily.        Allergies:  Allergies  Allergen Reactions   Codeine Nausea And Vomiting   Hydrocodone Nausea And Vomiting    Past Medical History, Surgical history, Social history, and Family History were reviewed and updated.  Review of Systems: Review of Systems  Constitutional: Negative.   HENT: Negative.    Eyes: Negative.   Respiratory: Negative.    Cardiovascular: Negative.   Gastrointestinal: Negative.   Genitourinary: Negative.   Musculoskeletal:  Positive for joint pain.  Skin: Negative.   Neurological:  Positive for focal weakness.  Endo/Heme/Allergies: Negative.   Psychiatric/Behavioral: Negative.      Physical Exam:  vitals were not taken for this visit.   Wt Readings from Last 3 Encounters:  02/08/21 147 lb (66.7 kg)  02/02/21 147 lb (66.7 kg)  01/05/21 154 lb (69.9 kg)    Physical Exam Vitals reviewed.  HENT:     Head: Normocephalic and atraumatic.  Eyes:     Pupils: Pupils are equal, round, and reactive to light.  Cardiovascular:     Rate and Rhythm: Normal rate and regular rhythm.     Heart sounds: Normal heart sounds.  Pulmonary:     Effort: Pulmonary effort is normal.     Breath sounds: Normal breath sounds.  Abdominal:     General: Bowel sounds are normal.     Palpations: Abdomen is soft.  Musculoskeletal:        General: No tenderness or  deformity. Normal range of motion.     Cervical back: Normal range of motion.  Lymphadenopathy:     Cervical: No cervical adenopathy.  Skin:    General: Skin is warm and dry.     Findings: No erythema or rash.  Neurological:     Mental Status: [name] is alert and oriented to person, place, and time.  Psychiatric:        Behavior: Behavior normal.        Thought Content: Thought content  normal.        Judgment: Judgment normal.   Lab Results  Component Value Date   WBC 4.7 02/08/2021   HGB 9.1 (L) 02/08/2021   HCT 27.6 (L) 02/08/2021   MCV 100.4 (H) 02/08/2021   PLT 281 02/08/2021   Lab Results  Component Value Date   FERRITIN 656 (H) 01/04/2021   IRON 72 01/04/2021   TIBC 251 01/04/2021   UIBC 179 01/04/2021   IRONPCTSAT 29 01/04/2021   Lab Results  Component Value Date   RETICCTPCT 1.4 02/08/2021   RBC 2.75 (L) 02/08/2021   RBC 2.74 (L) 02/08/2021   No results found for: KPAFRELGTCHN, LAMBDASER, KAPLAMBRATIO No results found for: IGGSERUM, IGA, IGMSERUM No results found for: Kathrynn Ducking, MSPIKE, SPEI   Chemistry      Component Value Date/Time   NA 136 02/08/2021 0817   NA 139 10/19/2020 1444   NA 144 12/14/2016 1159   NA 140 10/22/2016 1129   K 4.0 02/08/2021 0817   K 3.5 12/14/2016 1159   K 4.3 10/22/2016 1129   CL 101 02/08/2021 0817   CL 103 12/14/2016 1159   CO2 25 02/08/2021 0817   CO2 30 12/14/2016 1159   CO2 27 10/22/2016 1129   BUN 24 (H) 02/08/2021 0817   BUN 17 10/19/2020 1444   BUN 16 12/14/2016 1159   BUN 14.0 10/22/2016 1129   CREATININE 1.81 (H) 02/08/2021 0817   CREATININE 1.3 (H) 12/14/2016 1159   CREATININE 1.1 10/22/2016 1129      Component Value Date/Time   CALCIUM 10.1 02/08/2021 0817   CALCIUM 10.0 12/14/2016 1159   CALCIUM 10.4 10/22/2016 1129   ALKPHOS 62 02/08/2021 0817   ALKPHOS 48 12/14/2016 1159   ALKPHOS 60 10/22/2016 1129   AST 13 (L) 02/08/2021 0817   AST 16 10/22/2016 1129    ALT 9 02/08/2021 0817   ALT 23 12/14/2016 1159   ALT 14 10/22/2016 1129   BILITOT 0.3 02/08/2021 0817   BILITOT 0.31 10/22/2016 1129       Impression and Plan: [name] is 76 yo caucasian gentleman with metastatic neuroendocrine carcinoma of unknown primary.    We will go ahead and give him his 8th cycle of treatment.  I will go ahead and do another nuclear medicine scan on him.  I think would be getting the dotatate scan.  Hopefully the chromogranin A level is good to be improved even further.  His performance status is great.  [name] is really not having symptoms.  We will plan to get him back in 3 weeks.  We will go ahead and give him a Lupron today and Xgeva today.  Of note, we are withholding day 8 of treatment on him.  His bone marrow just cannot tolerate day 8 of therapy.    1/25/20239:03 AM

## 2021-02-08 NOTE — Progress Notes (Signed)
Per dr Marin Olp okay to treat today despite labs

## 2021-02-08 NOTE — Patient Instructions (Signed)

## 2021-02-10 LAB — CHROMOGRANIN A: Chromogranin A (ng/mL): 1197 ng/mL — ABNORMAL HIGH (ref 0.0–101.8)

## 2021-02-20 DIAGNOSIS — M9901 Segmental and somatic dysfunction of cervical region: Secondary | ICD-10-CM | POA: Diagnosis not present

## 2021-02-20 DIAGNOSIS — M9904 Segmental and somatic dysfunction of sacral region: Secondary | ICD-10-CM | POA: Diagnosis not present

## 2021-02-20 DIAGNOSIS — M9902 Segmental and somatic dysfunction of thoracic region: Secondary | ICD-10-CM | POA: Diagnosis not present

## 2021-02-20 DIAGNOSIS — M9903 Segmental and somatic dysfunction of lumbar region: Secondary | ICD-10-CM | POA: Diagnosis not present

## 2021-02-28 ENCOUNTER — Ambulatory Visit (HOSPITAL_COMMUNITY)
Admission: RE | Admit: 2021-02-28 | Discharge: 2021-02-28 | Disposition: A | Discharge status: Home / Self Care | Payer: Medicare Other | Source: Ambulatory Visit | Attending: Hematology & Oncology | Admitting: Hematology & Oncology

## 2021-02-28 ENCOUNTER — Other Ambulatory Visit: Payer: Self-pay

## 2021-02-28 DIAGNOSIS — D6481 Anemia due to antineoplastic chemotherapy: Secondary | ICD-10-CM | POA: Insufficient documentation

## 2021-02-28 DIAGNOSIS — C7801 Secondary malignant neoplasm of right lung: Secondary | ICD-10-CM | POA: Diagnosis not present

## 2021-02-28 DIAGNOSIS — T451X5A Adverse effect of antineoplastic and immunosuppressive drugs, initial encounter: Secondary | ICD-10-CM | POA: Diagnosis not present

## 2021-02-28 DIAGNOSIS — C7A8 Other malignant neuroendocrine tumors: Secondary | ICD-10-CM | POA: Insufficient documentation

## 2021-02-28 DIAGNOSIS — I2510 Atherosclerotic heart disease of native coronary artery without angina pectoris: Secondary | ICD-10-CM | POA: Diagnosis not present

## 2021-02-28 DIAGNOSIS — C7951 Secondary malignant neoplasm of bone: Secondary | ICD-10-CM | POA: Diagnosis not present

## 2021-02-28 MED ORDER — COPPER CU 64 DOTATATE 1 MCI/ML IV SOLN
4.0000 | Freq: Once | INTRAVENOUS | Status: AC
  Administered 2021-02-28: 4.3 via INTRAVENOUS

## 2021-03-01 ENCOUNTER — Inpatient Hospital Stay (HOSPITAL_BASED_OUTPATIENT_CLINIC_OR_DEPARTMENT_OTHER): Payer: Medicare Other | Admitting: Hematology & Oncology

## 2021-03-01 ENCOUNTER — Other Ambulatory Visit: Payer: Self-pay

## 2021-03-01 ENCOUNTER — Inpatient Hospital Stay: Payer: Medicare Other

## 2021-03-01 ENCOUNTER — Encounter: Payer: Self-pay | Admitting: Hematology & Oncology

## 2021-03-01 ENCOUNTER — Inpatient Hospital Stay: Payer: Medicare Other | Attending: Hematology & Oncology

## 2021-03-01 VITALS — BP 124/64 | HR 76 | Temp 98.2°F | Resp 18 | Ht 68.0 in | Wt 147.0 lb

## 2021-03-01 DIAGNOSIS — D508 Other iron deficiency anemias: Secondary | ICD-10-CM | POA: Insufficient documentation

## 2021-03-01 DIAGNOSIS — C61 Malignant neoplasm of prostate: Secondary | ICD-10-CM | POA: Insufficient documentation

## 2021-03-01 DIAGNOSIS — C7A8 Other malignant neuroendocrine tumors: Secondary | ICD-10-CM

## 2021-03-01 DIAGNOSIS — K909 Intestinal malabsorption, unspecified: Secondary | ICD-10-CM | POA: Diagnosis not present

## 2021-03-01 DIAGNOSIS — Z79899 Other long term (current) drug therapy: Secondary | ICD-10-CM | POA: Insufficient documentation

## 2021-03-01 DIAGNOSIS — M2550 Pain in unspecified joint: Secondary | ICD-10-CM | POA: Diagnosis not present

## 2021-03-01 DIAGNOSIS — R531 Weakness: Secondary | ICD-10-CM | POA: Diagnosis not present

## 2021-03-01 DIAGNOSIS — C7951 Secondary malignant neoplasm of bone: Secondary | ICD-10-CM

## 2021-03-01 DIAGNOSIS — Z5111 Encounter for antineoplastic chemotherapy: Secondary | ICD-10-CM | POA: Insufficient documentation

## 2021-03-01 LAB — CBC WITH DIFFERENTIAL (CANCER CENTER ONLY)
Abs Immature Granulocytes: 0.07 10*3/uL (ref 0.00–0.07)
Basophils Absolute: 0.1 10*3/uL (ref 0.0–0.1)
Basophils Relative: 1 %
Eosinophils Absolute: 0.2 10*3/uL (ref 0.0–0.5)
Eosinophils Relative: 2 %
HCT: 24.1 % — ABNORMAL LOW (ref 39.0–52.0)
Hemoglobin: 7.9 g/dL — ABNORMAL LOW (ref 13.0–17.0)
Immature Granulocytes: 1 %
Lymphocytes Relative: 16 %
Lymphs Abs: 1 10*3/uL (ref 0.7–4.0)
MCH: 33.8 pg (ref 26.0–34.0)
MCHC: 32.8 g/dL (ref 30.0–36.0)
MCV: 103 fL — ABNORMAL HIGH (ref 80.0–100.0)
Monocytes Absolute: 0.9 10*3/uL (ref 0.1–1.0)
Monocytes Relative: 13 %
Neutro Abs: 4.3 10*3/uL (ref 1.7–7.7)
Neutrophils Relative %: 67 %
Platelet Count: 281 10*3/uL (ref 150–400)
RBC: 2.34 MIL/uL — ABNORMAL LOW (ref 4.22–5.81)
RDW: 17.4 % — ABNORMAL HIGH (ref 11.5–15.5)
WBC Count: 6.5 10*3/uL (ref 4.0–10.5)
nRBC: 0 % (ref 0.0–0.2)

## 2021-03-01 LAB — IRON AND IRON BINDING CAPACITY (CC-WL,HP ONLY)
Iron: 87 ug/dL (ref 45–182)
Saturation Ratios: 35 % (ref 17.9–39.5)
TIBC: 249 ug/dL — ABNORMAL LOW (ref 250–450)
UIBC: 162 ug/dL (ref 117–376)

## 2021-03-01 LAB — CMP (CANCER CENTER ONLY)
ALT: 8 U/L (ref 0–44)
AST: 11 U/L — ABNORMAL LOW (ref 15–41)
Albumin: 3.7 g/dL (ref 3.5–5.0)
Alkaline Phosphatase: 58 U/L (ref 38–126)
Anion gap: 9 (ref 5–15)
BUN: 31 mg/dL — ABNORMAL HIGH (ref 8–23)
CO2: 24 mmol/L (ref 22–32)
Calcium: 9.2 mg/dL (ref 8.9–10.3)
Chloride: 103 mmol/L (ref 98–111)
Creatinine: 1.78 mg/dL — ABNORMAL HIGH (ref 0.61–1.24)
GFR, Estimated: 40 mL/min — ABNORMAL LOW (ref >60)
Glucose, Bld: 143 mg/dL — ABNORMAL HIGH (ref 70–99)
Potassium: 3.8 mmol/L (ref 3.5–5.1)
Sodium: 136 mmol/L (ref 135–145)
Total Bilirubin: 0.3 mg/dL (ref 0.3–1.2)
Total Protein: 6.1 g/dL — ABNORMAL LOW (ref 6.5–8.1)

## 2021-03-01 LAB — MAGNESIUM: Magnesium: 2.1 mg/dL (ref 1.7–2.4)

## 2021-03-01 LAB — FERRITIN: Ferritin: 1122 ng/mL — ABNORMAL HIGH (ref 24–336)

## 2021-03-01 LAB — PREPARE RBC (CROSSMATCH)

## 2021-03-01 MED ORDER — SODIUM CHLORIDE 0.9 % IV SOLN
65.0000 mg/m2 | Freq: Once | INTRAVENOUS | Status: AC
  Administered 2021-03-01: 120 mg via INTRAVENOUS
  Filled 2021-03-01: qty 6

## 2021-03-01 MED ORDER — SODIUM CHLORIDE 0.9 % IV SOLN
27.0000 mg/m2 | Freq: Once | INTRAVENOUS | Status: AC
  Administered 2021-03-01: 50 mg via INTRAVENOUS
  Filled 2021-03-01: qty 50

## 2021-03-01 MED ORDER — MAGNESIUM SULFATE 2 GM/50ML IV SOLN
2.0000 g | Freq: Once | INTRAVENOUS | Status: AC
  Administered 2021-03-01: 2 g via INTRAVENOUS
  Filled 2021-03-01: qty 50

## 2021-03-01 MED ORDER — POTASSIUM CHLORIDE IN NACL 20-0.9 MEQ/L-% IV SOLN
Freq: Once | INTRAVENOUS | Status: AC
  Filled 2021-03-01: qty 1000

## 2021-03-01 MED ORDER — ATROPINE SULFATE 1 MG/ML IV SOLN
0.5000 mg | Freq: Once | INTRAVENOUS | Status: AC | PRN
  Administered 2021-03-01: 0.5 mg via INTRAVENOUS
  Filled 2021-03-01: qty 1

## 2021-03-01 MED ORDER — PEGFILGRASTIM 6 MG/0.6ML ~~LOC~~ PSKT
6.0000 mg | PREFILLED_SYRINGE | Freq: Once | SUBCUTANEOUS | Status: AC
  Administered 2021-03-01: 6 mg via SUBCUTANEOUS
  Filled 2021-03-01: qty 0.6

## 2021-03-01 MED ORDER — PALONOSETRON HCL INJECTION 0.25 MG/5ML
0.2500 mg | Freq: Once | INTRAVENOUS | Status: AC
  Administered 2021-03-01: 0.25 mg via INTRAVENOUS
  Filled 2021-03-01: qty 5

## 2021-03-01 MED ORDER — SODIUM CHLORIDE 0.9 % IV SOLN
10.0000 mg | Freq: Once | INTRAVENOUS | Status: AC
  Administered 2021-03-01: 10 mg via INTRAVENOUS
  Filled 2021-03-01: qty 10

## 2021-03-01 MED ORDER — SODIUM CHLORIDE 0.9 % IV SOLN: Freq: Once | INTRAVENOUS | Status: AC

## 2021-03-01 MED ORDER — SODIUM CHLORIDE 0.9% FLUSH
10.0000 mL | INTRAVENOUS | Status: DC | PRN
  Administered 2021-03-01: 10 mL

## 2021-03-01 MED ORDER — SODIUM CHLORIDE 0.9 % IV SOLN
150.0000 mg | Freq: Once | INTRAVENOUS | Status: AC
  Administered 2021-03-01: 150 mg via INTRAVENOUS
  Filled 2021-03-01: qty 150

## 2021-03-01 MED ORDER — HEPARIN SOD (PORK) LOCK FLUSH 100 UNIT/ML IV SOLN
500.0000 [IU] | Freq: Once | INTRAVENOUS | Status: AC | PRN
  Administered 2021-03-01: 500 [IU]

## 2021-03-01 NOTE — Progress Notes (Signed)
Ok to treat with Creatinine of 1.78 per dr Marin Olp

## 2021-03-01 NOTE — Progress Notes (Signed)
Hematology and Oncology Follow Up Visit  [name] [id number] 12-04-1945 76 y.o. 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 Iron def anemia -- malabsorption   Past Therapy: Zytiga 1000 mg by mouth daily - discontinued on 08/15/2016 Xtandi 160 mg po q day - start on 08/15/2016 - discontinued Radium-223 therapy -- s/p cycle #6 Palliative radiation to the right sacrum Lutathera - s/p cycle 4/4 --last dose on 07/11/2017 Carbo/VP-16/Tecentriq --  S/p cycle #5 Keytruda q 6 week -- Maintanence -- s/p cycle #3 - changed from 3 to 6 week on 06/30/2018 Taxotere/Keytruda -- start on 09/17/2018 -- s/p cycle #10 -- d/c due to progression   Current Therapy:        Lurbinectedin -- started on 04/29/2019, s/p cycle 17 -- d/c on 05/2020 due to progression CDDP/Irinotecan -- s/p cycle #8-- start on 06/15/2020 Xgeva 120 mg subcutaneous Q3 months - due in 04/2021 Lupron 22 mg IM every 3 months - due in 04/2021  SBRT to sacral lesion  IV iron as indicated    Interim History:  [name] is here today for follow-up.  He did have a nuclear medicine scan yesterday.  This did show that there is some slight increase in size of the pulmonary nodules and slight increase in activity.  However, there were no new areas of malignancy.  As such, I really think that we can continue him on this protocol.  The last Chromogranin A level was elevated at 1200.  Again we will have to watch this.  He has had no cough.  He does feel tired.  He is quite anemic.  As such, I think we will have to go ahead and transfuse him.  I think this is much safer than trying to use ESA.  I would worry about thromboembolic disease with ESA intervention.  He has had a good appetite.  He has had no change in bowel or bladder habits.  He does have little bit of diarrhea after each treatment.  He has had no problems with the right  leg.  He has a brace on the right lower leg.  He has had no urinary issues.  Has been no hematuria.  Overall, I would say his performance status is ECOG 1.     Medications:  Allergies as of 03/01/2021       Reactions   Codeine Nausea And Vomiting   Hydrocodone Nausea And Vomiting        Medication List        Accurate as of March 01, 2021  8:42 AM. If you have any questions, ask your nurse or doctor.          STOP taking these medications    clindamycin 300 MG capsule Commonly known as: CLEOCIN Stopped by: [name], MD       TAKE these medications    acetaminophen 500 MG tablet Commonly known as: TYLENOL Take 1 tablet (500 mg total) by mouth every 4 (four) hours as needed for mild pain, headache or fever (fever >/= 101).   Anti-Diarrheal 2 MG tablet Generic drug: loperamide Take 2 tablets by mouth at diarrhea onset, then 1 tablet every 2 hours until 12 hours with no bowel movment. May take 2 tablets every 4 hours at night. If diarrhea recurs repeat. What changed:  how much to take how to take this when to take this reasons to take this  ascorbic acid 500 MG tablet Commonly known as: VITAMIN C Take 1 tablet (500 mg total) by mouth daily.   aspirin 81 MG tablet Take 81 mg by mouth daily.   dexamethasone 4 MG tablet Commonly known as: DECADRON Take 4 mg by mouth daily. X 3 days   ezetimibe 10 MG tablet Commonly known as: ZETIA Take 1 tablet by mouth daily   Generlac 10 GM/15ML Soln Generic drug: lactulose (encephalopathy) Take 15 mLs (10 g total) by mouth daily as needed for constipation.   isosorbide mononitrate 30 MG 24 hr tablet Commonly known as: IMDUR TAKE 1 TABLET (30 MG TOTAL) BY MOUTH DAILY. What changed: how much to take   lidocaine-prilocaine cream Commonly known as: EMLA Apply to affected area once What changed:  how much to take how to take this when to take this additional instructions   ondansetron 8 MG  tablet Commonly known as: Zofran Take 1 tablet (8 mg total) by mouth 2 (two) times daily as needed. Start on the third day after cisplatin chemotherapy.   OVER THE COUNTER MEDICATION 1 each by Other route See admin instructions. Juice Plus -- 8 capsule of each the garden, vineyard, and orchard twice a day.   pantoprazole 40 MG tablet Commonly known as: PROTONIX TAKE 1 TABLET (40 MG TOTAL) BY MOUTH AT BEDTIME. What changed: how much to take   prochlorperazine 10 MG tablet Commonly known as: COMPAZINE Take 1 tablet (10 mg total) by mouth every 6 (six) hours as needed for nausea or vomiting.   solifenacin 10 MG tablet Commonly known as: VESIcare Take 1 tablet (10 mg total) by mouth daily.   Zinc 50 MG Caps Take 50 mg by mouth daily.        Allergies:  Allergies  Allergen Reactions   Codeine Nausea And Vomiting   Hydrocodone Nausea And Vomiting    Past Medical History, Surgical history, Social history, and Family History were reviewed and updated.  Review of Systems: Review of Systems  Constitutional: Negative.   HENT: Negative.    Eyes: Negative.   Respiratory: Negative.    Cardiovascular: Negative.   Gastrointestinal: Negative.   Genitourinary: Negative.   Musculoskeletal:  Positive for joint pain.  Skin: Negative.   Neurological:  Positive for focal weakness.  Endo/Heme/Allergies: Negative.   Psychiatric/Behavioral: Negative.      Physical Exam:  height is 5\' 8"  (1.727 m) and weight is 147 lb (66.7 kg). His oral temperature is 98.2 F (36.8 C). His blood pressure is 124/64 and his pulse is 76. His respiration is 18 and oxygen saturation is 100%.   Wt Readings from Last 3 Encounters:  03/01/21 147 lb (66.7 kg)  02/08/21 147 lb (66.7 kg)  02/02/21 147 lb (66.7 kg)    Physical Exam Vitals reviewed.  HENT:     Head: Normocephalic and atraumatic.  Eyes:     Pupils: Pupils are equal, round, and reactive to light.  Cardiovascular:     Rate and Rhythm:  Normal rate and regular rhythm.     Heart sounds: Normal heart sounds.  Pulmonary:     Effort: Pulmonary effort is normal.     Breath sounds: Normal breath sounds.  Abdominal:     General: Bowel sounds are normal.     Palpations: Abdomen is soft.  Musculoskeletal:        General: No tenderness or deformity. Normal range of motion.     Cervical back: Normal range of motion.  Lymphadenopathy:  Cervical: No cervical adenopathy.  Skin:    General: Skin is warm and dry.     Findings: No erythema or rash.  Neurological:     Mental Status: He is alert and oriented to person, place, and time.  Psychiatric:        Behavior: Behavior normal.        Thought Content: Thought content normal.        Judgment: Judgment normal.   Lab Results  Component Value Date   WBC 6.5 03/01/2021   HGB 7.9 (L) 03/01/2021   HCT 24.1 (L) 03/01/2021   MCV 103.0 (H) 03/01/2021   PLT 281 03/01/2021   Lab Results  Component Value Date   FERRITIN 1,393 (H) 02/08/2021   IRON 84 02/08/2021   TIBC 251 02/08/2021   UIBC 167 02/08/2021   IRONPCTSAT 34 02/08/2021   Lab Results  Component Value Date   RETICCTPCT 1.4 02/08/2021   RBC 2.34 (L) 03/01/2021   No results found for: KPAFRELGTCHN, LAMBDASER, KAPLAMBRATIO No results found for: IGGSERUM, IGA, IGMSERUM No results found for: Kathrynn Ducking, MSPIKE, SPEI   Chemistry      Component Value Date/Time   NA 136 03/01/2021 0805   NA 139 10/19/2020 1444   NA 144 12/14/2016 1159   NA 140 10/22/2016 1129   K 3.8 03/01/2021 0805   K 3.5 12/14/2016 1159   K 4.3 10/22/2016 1129   CL 103 03/01/2021 0805   CL 103 12/14/2016 1159   CO2 24 03/01/2021 0805   CO2 30 12/14/2016 1159   CO2 27 10/22/2016 1129   BUN 31 (H) 03/01/2021 0805   BUN 17 10/19/2020 1444   BUN 16 12/14/2016 1159   BUN 14.0 10/22/2016 1129   CREATININE 1.78 (H) 03/01/2021 0805   CREATININE 1.3 (H) 12/14/2016 1159   CREATININE 1.1  10/22/2016 1129      Component Value Date/Time   CALCIUM 9.2 03/01/2021 0805   CALCIUM 10.0 12/14/2016 1159   CALCIUM 10.4 10/22/2016 1129   ALKPHOS 58 03/01/2021 0805   ALKPHOS 48 12/14/2016 1159   ALKPHOS 60 10/22/2016 1129   AST 11 (L) 03/01/2021 0805   AST 16 10/22/2016 1129   ALT 8 03/01/2021 0805   ALT 23 12/14/2016 1159   ALT 14 10/22/2016 1129   BILITOT 0.3 03/01/2021 0805   BILITOT 0.31 10/22/2016 1129       Impression and Plan: Mr. Shell is 76 yo caucasian gentleman with metastatic neuroendocrine carcinoma of unknown primary.    We will go ahead and give him his 9th cycle of treatment.  Again, we will have to be careful with treatment.  We really do not have many options left for him.  I think he is doing well with his protocol.  His quality of life is still doing well.  I am happy about this.  Again we will transfuse him.  This will make him feel a little bit better.  We will plan to get him back in 3 weeks.  His blood counts look great so I think we can try to go every 3 weeks for him now.     2/15/20238:42 AM

## 2021-03-01 NOTE — Patient Instructions (Addendum)
[facility] [place]  Discharge Instructions: Thank you for choosing [facility] to provide your oncology and hematology care.   If you have a lab appointment with the [facility], please go directly to the [facility] and check in at the registration area.  Wear comfortable clothing and clothing appropriate for easy access to any Portacath or PICC line.   We strive to give you quality time with your provider. You may need to reschedule your appointment if you arrive late (15 or more minutes).  Arriving late affects you and other patients whose appointments are after yours.  Also, if you miss three or more appointments without notifying the office, you may be dismissed from the clinic at the providers discretion.      For prescription refill requests, have your pharmacy contact our office and allow 72 hours for refills to be completed.    Today you received the following chemotherapy and/or immunotherapy agents Cisplatin      To help prevent nausea and vomiting after your treatment, we encourage you to take your nausea medication as directed.  BELOW ARE SYMPTOMS THAT SHOULD BE REPORTED IMMEDIATELY: *FEVER GREATER THAN 100.4 F (38 C) OR HIGHER *CHILLS OR SWEATING *NAUSEA AND VOMITING THAT IS NOT CONTROLLED WITH YOUR NAUSEA MEDICATION *UNUSUAL SHORTNESS OF BREATH *UNUSUAL BRUISING OR BLEEDING *URINARY PROBLEMS (pain or burning when urinating, or frequent urination) *BOWEL PROBLEMS (unusual diarrhea, constipation, pain near the anus) TENDERNESS IN MOUTH AND THROAT WITH OR WITHOUT PRESENCE OF ULCERS (sore throat, sores in mouth, or a toothache) UNUSUAL RASH, SWELLING OR PAIN  UNUSUAL VAGINAL DISCHARGE OR ITCHING   Items with * indicate a potential emergency and should be followed up as soon as possible or go to the Emergency Department if any problems should occur.  Please show the CHEMOTHERAPY ALERT CARD or IMMUNOTHERAPY ALERT CARD at check-in to the  Emergency Department and triage nurse. Should you have questions after your visit or need to cancel or reschedule your appointment, please contact [facility]  [phone] and follow the prompts.  Office hours are 8:00 a.m. to 4:30 p.m. Monday - Friday. Please note that voicemails left after 4:00 p.m. may not be returned until the following business day.  We are closed weekends and major holidays. You have access to a nurse at all times for urgent questions. Please call the main number to the clinic [phone] and follow the prompts.  For any non-urgent questions, you may also contact your provider using MyChart. We now offer e-Visits for anyone 89 and older to request care online for non-urgent symptoms. For details visit [email or web address].   Also download the MyChart app! Go to the app store, search "MyChart", open the app, select [facility], and log in with your MyChart username and password.  Due to Covid, a mask is required upon entering the hospital/clinic. If you do not have a mask, one will be given to you upon arrival. For doctor visits, patients may have 1 support person aged 35 or older with them. For treatment visits, patients cannot have anyone with them due to current Covid guidelines and our immunocompromised population. [facility]  Discharge Instructions: Thank you for choosing [facility] to provide your oncology and hematology care.   If you have a lab appointment with the [facility], please go directly to the [facility] and check in at the registration area.  Wear comfortable clothing  and clothing appropriate for easy access to any Portacath or PICC line.   We strive to give you quality time with your provider. You may need to reschedule your appointment if you arrive late (15 or more minutes).  Arriving late affects you and other patients whose appointments are after yours.  Also, if you miss three  or more appointments without notifying the office, you may be dismissed from the clinic at the providers discretion.      For prescription refill requests, have your pharmacy contact our office and allow 72 hours for refills to be completed.    Today you received the following chemotherapy and/or immunotherapy agents cpt-11 and cisplatin. Neulasta injection   To help prevent nausea and vomiting after your treatment, we encourage you to take your nausea medication as directed.  BELOW ARE SYMPTOMS THAT SHOULD BE REPORTED IMMEDIATELY: *FEVER GREATER THAN 100.4 F (38 C) OR HIGHER *CHILLS OR SWEATING *NAUSEA AND VOMITING THAT IS NOT CONTROLLED WITH YOUR NAUSEA MEDICATION *UNUSUAL SHORTNESS OF BREATH *UNUSUAL BRUISING OR BLEEDING *URINARY PROBLEMS (pain or burning when urinating, or frequent urination) *BOWEL PROBLEMS (unusual diarrhea, constipation, pain near the anus) TENDERNESS IN MOUTH AND THROAT WITH OR WITHOUT PRESENCE OF ULCERS (sore throat, sores in mouth, or a toothache) UNUSUAL RASH, SWELLING OR PAIN  UNUSUAL VAGINAL DISCHARGE OR ITCHING   Items with * indicate a potential emergency and should be followed up as soon as possible or go to the Emergency Department if any problems should occur.  Please show the CHEMOTHERAPY ALERT CARD or IMMUNOTHERAPY ALERT CARD at check-in to the Emergency Department and triage nurse. Should you have questions after your visit or need to cancel or reschedule your appointment, please contact [facility]  [phone] and follow the prompts.  Office hours are 8:00 a.m. to 4:30 p.m. Monday - Friday. Please note that voicemails left after 4:00 p.m. may not be returned until the following business day.  We are closed weekends and major holidays. You have access to a nurse at all times for urgent questions. Please call the main number to the clinic [phone] and follow the prompts.  For any non-urgent questions, you may also  contact your provider using MyChart. We now offer e-Visits for anyone 34 and older to request care online for non-urgent symptoms. For details visit [email or web address].   Also download the MyChart app! Go to the app store, search "MyChart", open the app, select [facility], and log in with your MyChart username and password.  Due to Covid, a mask is required upon entering the hospital/clinic. If you do not have a mask, one will be given to you upon arrival. For doctor visits, patients may have 1 support person aged 95 or older with them. For treatment visits, patients cannot have anyone with them due to current Covid guidelines and our immunocompromised population.

## 2021-03-01 NOTE — Patient Instructions (Signed)

## 2021-03-02 ENCOUNTER — Inpatient Hospital Stay: Payer: Medicare Other

## 2021-03-02 ENCOUNTER — Telehealth: Payer: Self-pay | Admitting: *Deleted

## 2021-03-02 DIAGNOSIS — D508 Other iron deficiency anemias: Secondary | ICD-10-CM | POA: Diagnosis not present

## 2021-03-02 DIAGNOSIS — M2550 Pain in unspecified joint: Secondary | ICD-10-CM | POA: Diagnosis not present

## 2021-03-02 DIAGNOSIS — Z5111 Encounter for antineoplastic chemotherapy: Secondary | ICD-10-CM | POA: Diagnosis not present

## 2021-03-02 DIAGNOSIS — R531 Weakness: Secondary | ICD-10-CM | POA: Diagnosis not present

## 2021-03-02 DIAGNOSIS — D649 Anemia, unspecified: Secondary | ICD-10-CM

## 2021-03-02 DIAGNOSIS — C61 Malignant neoplasm of prostate: Secondary | ICD-10-CM | POA: Diagnosis not present

## 2021-03-02 DIAGNOSIS — K909 Intestinal malabsorption, unspecified: Secondary | ICD-10-CM | POA: Diagnosis not present

## 2021-03-02 DIAGNOSIS — C7951 Secondary malignant neoplasm of bone: Secondary | ICD-10-CM

## 2021-03-02 DIAGNOSIS — C7A8 Other malignant neuroendocrine tumors: Secondary | ICD-10-CM

## 2021-03-02 LAB — CHROMOGRANIN A: Chromogranin A (ng/mL): 359.9 ng/mL — ABNORMAL HIGH (ref 0.0–101.8)

## 2021-03-02 MED ORDER — SODIUM CHLORIDE 0.9% IV SOLUTION
250.0000 mL | Freq: Once | INTRAVENOUS | Status: AC
  Administered 2021-03-02: 250 mL via INTRAVENOUS

## 2021-03-02 MED ORDER — HEPARIN SOD (PORK) LOCK FLUSH 100 UNIT/ML IV SOLN
500.0000 [IU] | Freq: Every day | INTRAVENOUS | Status: AC | PRN
  Administered 2021-03-02: 500 [IU]

## 2021-03-02 MED ORDER — HEPARIN SOD (PORK) LOCK FLUSH 100 UNIT/ML IV SOLN: 500.0000 [IU] | Freq: Every day | INTRAVENOUS | Status: DC | PRN

## 2021-03-02 MED ORDER — DIPHENHYDRAMINE HCL 25 MG PO CAPS
25.0000 mg | ORAL_CAPSULE | Freq: Once | ORAL | Status: AC
  Administered 2021-03-02: 25 mg via ORAL
  Filled 2021-03-02: qty 1

## 2021-03-02 MED ORDER — SODIUM CHLORIDE 0.9% FLUSH
10.0000 mL | INTRAVENOUS | Status: AC | PRN
  Administered 2021-03-02: 10 mL

## 2021-03-02 MED ORDER — ACETAMINOPHEN 325 MG PO TABS
650.0000 mg | ORAL_TABLET | Freq: Once | ORAL | Status: AC
  Administered 2021-03-02: 650 mg via ORAL
  Filled 2021-03-02: qty 2

## 2021-03-02 MED ORDER — SODIUM CHLORIDE 0.9% FLUSH: 10.0000 mL | INTRAVENOUS | Status: DC | PRN

## 2021-03-02 NOTE — Patient Instructions (Signed)

## 2021-03-02 NOTE — Telephone Encounter (Signed)
Per 03/01/24 los - gave upcoming appointments - confirmed

## 2021-03-03 LAB — TYPE AND SCREEN
ABO/RH(D): O POS
Antibody Screen: NEGATIVE
Unit division: 0
Unit division: 0

## 2021-03-03 LAB — BPAM RBC
Blood Product Expiration Date: 202303142359
Blood Product Expiration Date: 202303162359
ISSUE DATE / TIME: 202302160711
ISSUE DATE / TIME: 202302160711
Unit Type and Rh: 5100
Unit Type and Rh: 5100

## 2021-03-08 ENCOUNTER — Other Ambulatory Visit (HOSPITAL_BASED_OUTPATIENT_CLINIC_OR_DEPARTMENT_OTHER): Payer: Self-pay

## 2021-03-08 DIAGNOSIS — L03031 Cellulitis of right toe: Secondary | ICD-10-CM | POA: Diagnosis not present

## 2021-03-08 MED ORDER — CLINDAMYCIN HCL 300 MG PO CAPS
ORAL_CAPSULE | ORAL | 0 refills | Status: DC
  Filled 2021-03-08: qty 30, 10d supply, fill #0

## 2021-03-09 ENCOUNTER — Encounter: Payer: Self-pay | Admitting: Hematology & Oncology

## 2021-03-09 ENCOUNTER — Other Ambulatory Visit: Payer: Self-pay | Admitting: Hematology & Oncology

## 2021-03-09 ENCOUNTER — Other Ambulatory Visit (HOSPITAL_BASED_OUTPATIENT_CLINIC_OR_DEPARTMENT_OTHER): Payer: Self-pay

## 2021-03-09 DIAGNOSIS — C7A8 Other malignant neuroendocrine tumors: Secondary | ICD-10-CM

## 2021-03-09 DIAGNOSIS — C7951 Secondary malignant neoplasm of bone: Secondary | ICD-10-CM

## 2021-03-09 MED ORDER — DEXAMETHASONE 4 MG PO TABS
8.0000 mg | ORAL_TABLET | Freq: Every day | ORAL | 1 refills | Status: AC
  Filled 2021-03-09: qty 30, 15d supply, fill #0

## 2021-03-10 ENCOUNTER — Other Ambulatory Visit (HOSPITAL_BASED_OUTPATIENT_CLINIC_OR_DEPARTMENT_OTHER): Payer: Self-pay

## 2021-03-15 ENCOUNTER — Encounter: Payer: Self-pay | Admitting: Gastroenterology

## 2021-03-20 DIAGNOSIS — M9903 Segmental and somatic dysfunction of lumbar region: Secondary | ICD-10-CM | POA: Diagnosis not present

## 2021-03-20 DIAGNOSIS — M9901 Segmental and somatic dysfunction of cervical region: Secondary | ICD-10-CM | POA: Diagnosis not present

## 2021-03-20 DIAGNOSIS — M9902 Segmental and somatic dysfunction of thoracic region: Secondary | ICD-10-CM | POA: Diagnosis not present

## 2021-03-20 DIAGNOSIS — M9904 Segmental and somatic dysfunction of sacral region: Secondary | ICD-10-CM | POA: Diagnosis not present

## 2021-03-22 ENCOUNTER — Telehealth: Payer: Self-pay

## 2021-03-22 ENCOUNTER — Inpatient Hospital Stay (HOSPITAL_BASED_OUTPATIENT_CLINIC_OR_DEPARTMENT_OTHER): Payer: Medicare Other | Admitting: Hematology & Oncology

## 2021-03-22 ENCOUNTER — Encounter: Payer: Self-pay | Admitting: Hematology & Oncology

## 2021-03-22 ENCOUNTER — Other Ambulatory Visit: Payer: Self-pay

## 2021-03-22 ENCOUNTER — Inpatient Hospital Stay: Payer: Medicare Other

## 2021-03-22 ENCOUNTER — Inpatient Hospital Stay: Payer: Medicare Other | Attending: Hematology & Oncology

## 2021-03-22 VITALS — BP 107/67 | HR 89 | Temp 98.3°F | Resp 17 | Ht 68.0 in | Wt 143.1 lb

## 2021-03-22 DIAGNOSIS — Z5111 Encounter for antineoplastic chemotherapy: Secondary | ICD-10-CM | POA: Diagnosis not present

## 2021-03-22 DIAGNOSIS — R531 Weakness: Secondary | ICD-10-CM | POA: Insufficient documentation

## 2021-03-22 DIAGNOSIS — D509 Iron deficiency anemia, unspecified: Secondary | ICD-10-CM | POA: Insufficient documentation

## 2021-03-22 DIAGNOSIS — K909 Intestinal malabsorption, unspecified: Secondary | ICD-10-CM | POA: Insufficient documentation

## 2021-03-22 DIAGNOSIS — C7A8 Other malignant neuroendocrine tumors: Secondary | ICD-10-CM

## 2021-03-22 DIAGNOSIS — Z885 Allergy status to narcotic agent status: Secondary | ICD-10-CM | POA: Diagnosis not present

## 2021-03-22 DIAGNOSIS — Z79899 Other long term (current) drug therapy: Secondary | ICD-10-CM | POA: Diagnosis not present

## 2021-03-22 DIAGNOSIS — R634 Abnormal weight loss: Secondary | ICD-10-CM | POA: Insufficient documentation

## 2021-03-22 DIAGNOSIS — C61 Malignant neoplasm of prostate: Secondary | ICD-10-CM | POA: Diagnosis not present

## 2021-03-22 DIAGNOSIS — M2550 Pain in unspecified joint: Secondary | ICD-10-CM | POA: Insufficient documentation

## 2021-03-22 DIAGNOSIS — C7951 Secondary malignant neoplasm of bone: Secondary | ICD-10-CM

## 2021-03-22 LAB — CBC WITH DIFFERENTIAL (CANCER CENTER ONLY)
Abs Immature Granulocytes: 0.05 10*3/uL (ref 0.00–0.07)
Basophils Absolute: 0 10*3/uL (ref 0.0–0.1)
Basophils Relative: 1 %
Eosinophils Absolute: 0.2 10*3/uL (ref 0.0–0.5)
Eosinophils Relative: 2 %
HCT: 30.8 % — ABNORMAL LOW (ref 39.0–52.0)
Hemoglobin: 10.3 g/dL — ABNORMAL LOW (ref 13.0–17.0)
Immature Granulocytes: 1 %
Lymphocytes Relative: 12 %
Lymphs Abs: 1.1 10*3/uL (ref 0.7–4.0)
MCH: 32.6 pg (ref 26.0–34.0)
MCHC: 33.4 g/dL (ref 30.0–36.0)
MCV: 97.5 fL (ref 80.0–100.0)
Monocytes Absolute: 1 10*3/uL (ref 0.1–1.0)
Monocytes Relative: 12 %
Neutro Abs: 6.2 10*3/uL (ref 1.7–7.7)
Neutrophils Relative %: 72 %
Platelet Count: 216 10*3/uL (ref 150–400)
RBC: 3.16 MIL/uL — ABNORMAL LOW (ref 4.22–5.81)
RDW: 17 % — ABNORMAL HIGH (ref 11.5–15.5)
WBC Count: 8.5 10*3/uL (ref 4.0–10.5)
nRBC: 0 % (ref 0.0–0.2)

## 2021-03-22 LAB — RETICULOCYTES
Immature Retic Fract: 12.3 % (ref 2.3–15.9)
RBC.: 3.15 MIL/uL — ABNORMAL LOW (ref 4.22–5.81)
Retic Count, Absolute: 24.6 10*3/uL (ref 19.0–186.0)
Retic Ct Pct: 0.8 % (ref 0.4–3.1)

## 2021-03-22 LAB — CMP (CANCER CENTER ONLY)
ALT: 7 U/L (ref 0–44)
AST: 12 U/L — ABNORMAL LOW (ref 15–41)
Albumin: 3.8 g/dL (ref 3.5–5.0)
Alkaline Phosphatase: 66 U/L (ref 38–126)
Anion gap: 9 (ref 5–15)
BUN: 25 mg/dL — ABNORMAL HIGH (ref 8–23)
CO2: 26 mmol/L (ref 22–32)
Calcium: 9 mg/dL (ref 8.9–10.3)
Chloride: 101 mmol/L (ref 98–111)
Creatinine: 1.61 mg/dL — ABNORMAL HIGH (ref 0.61–1.24)
GFR, Estimated: 45 mL/min — ABNORMAL LOW (ref >60)
Glucose, Bld: 88 mg/dL (ref 70–99)
Potassium: 4.1 mmol/L (ref 3.5–5.1)
Sodium: 136 mmol/L (ref 135–145)
Total Bilirubin: 0.3 mg/dL (ref 0.3–1.2)
Total Protein: 5.9 g/dL — ABNORMAL LOW (ref 6.5–8.1)

## 2021-03-22 LAB — IRON AND IRON BINDING CAPACITY (CC-WL,HP ONLY)
Iron: 87 ug/dL (ref 45–182)
Saturation Ratios: 38 % (ref 17.9–39.5)
TIBC: 231 ug/dL — ABNORMAL LOW (ref 250–450)
UIBC: 144 ug/dL (ref 117–376)

## 2021-03-22 LAB — MAGNESIUM: Magnesium: 2.1 mg/dL (ref 1.7–2.4)

## 2021-03-22 LAB — FERRITIN: Ferritin: 1542 ng/mL — ABNORMAL HIGH (ref 24–336)

## 2021-03-22 MED ORDER — SODIUM CHLORIDE 0.9 % IV SOLN
27.0000 mg/m2 | Freq: Once | INTRAVENOUS | Status: AC
  Administered 2021-03-22: 50 mg via INTRAVENOUS
  Filled 2021-03-22: qty 50

## 2021-03-22 MED ORDER — SODIUM CHLORIDE 0.9 % IV SOLN: Freq: Once | INTRAVENOUS | Status: AC

## 2021-03-22 MED ORDER — PEGFILGRASTIM 6 MG/0.6ML ~~LOC~~ PSKT
6.0000 mg | PREFILLED_SYRINGE | Freq: Once | SUBCUTANEOUS | Status: AC
  Administered 2021-03-22: 6 mg via SUBCUTANEOUS
  Filled 2021-03-22: qty 0.6

## 2021-03-22 MED ORDER — POTASSIUM CHLORIDE IN NACL 20-0.9 MEQ/L-% IV SOLN
Freq: Once | INTRAVENOUS | Status: AC
  Filled 2021-03-22: qty 1000

## 2021-03-22 MED ORDER — SODIUM CHLORIDE 0.9% FLUSH
10.0000 mL | INTRAVENOUS | Status: DC | PRN
  Administered 2021-03-22: 10 mL

## 2021-03-22 MED ORDER — ATROPINE SULFATE 1 MG/ML IV SOLN
0.5000 mg | Freq: Once | INTRAVENOUS | Status: AC | PRN
  Administered 2021-03-22: 0.5 mg via INTRAVENOUS
  Filled 2021-03-22: qty 1

## 2021-03-22 MED ORDER — SODIUM CHLORIDE 0.9 % IV SOLN
65.0000 mg/m2 | Freq: Once | INTRAVENOUS | Status: AC
  Administered 2021-03-22: 120 mg via INTRAVENOUS
  Filled 2021-03-22: qty 6

## 2021-03-22 MED ORDER — MAGNESIUM SULFATE 2 GM/50ML IV SOLN
2.0000 g | Freq: Once | INTRAVENOUS | Status: AC
  Administered 2021-03-22: 2 g via INTRAVENOUS
  Filled 2021-03-22: qty 50

## 2021-03-22 MED ORDER — SODIUM CHLORIDE 0.9 % IV SOLN: INTRAVENOUS | Status: DC

## 2021-03-22 MED ORDER — HEPARIN SOD (PORK) LOCK FLUSH 100 UNIT/ML IV SOLN
500.0000 [IU] | Freq: Once | INTRAVENOUS | Status: AC | PRN
  Administered 2021-03-22: 500 [IU]

## 2021-03-22 MED ORDER — SODIUM CHLORIDE 0.9 % IV SOLN
150.0000 mg | Freq: Once | INTRAVENOUS | Status: AC
  Administered 2021-03-22: 150 mg via INTRAVENOUS
  Filled 2021-03-22: qty 150

## 2021-03-22 MED ORDER — SODIUM CHLORIDE 0.9 % IV SOLN
10.0000 mg | Freq: Once | INTRAVENOUS | Status: AC
  Administered 2021-03-22: 10 mg via INTRAVENOUS
  Filled 2021-03-22: qty 10

## 2021-03-22 MED ORDER — PALONOSETRON HCL INJECTION 0.25 MG/5ML
0.2500 mg | Freq: Once | INTRAVENOUS | Status: AC
  Administered 2021-03-22: 0.25 mg via INTRAVENOUS
  Filled 2021-03-22: qty 5

## 2021-03-22 NOTE — Patient Instructions (Signed)
[facility] [place]  Discharge Instructions: ?Thank you for choosing [facility] to provide your oncology and hematology care.  ? ?If you have a lab appointment with the [facility], please go directly to the [facility] and check in at the registration area. ? ?Wear comfortable clothing and clothing appropriate for easy access to any Portacath or PICC line.  ? ?We strive to give you quality time with your provider. You may need to reschedule your appointment if you arrive late (15 or more minutes).  Arriving late affects you and other patients whose appointments are after yours.  Also, if you miss three or more appointments without notifying the office, you may be dismissed from the clinic at the provider?s discretion.    ?  ?For prescription refill requests, have your pharmacy contact our office and allow 72 hours for refills to be completed.   ? ?Today you received the following chemotherapy and/or immunotherapy agents Camptosar, Cisplatin    ?  ?To help prevent nausea and vomiting after your treatment, we encourage you to take your nausea medication as directed. ? ?BELOW ARE SYMPTOMS THAT SHOULD BE REPORTED IMMEDIATELY: ?*FEVER GREATER THAN 100.4 F (38 ?C) OR HIGHER ?*CHILLS OR SWEATING ?*NAUSEA AND VOMITING THAT IS NOT CONTROLLED WITH YOUR NAUSEA MEDICATION ?*UNUSUAL SHORTNESS OF BREATH ?*UNUSUAL BRUISING OR BLEEDING ?*URINARY PROBLEMS (pain or burning when urinating, or frequent urination) ?*BOWEL PROBLEMS (unusual diarrhea, constipation, pain near the anus) ?TENDERNESS IN MOUTH AND THROAT WITH OR WITHOUT PRESENCE OF ULCERS (sore throat, sores in mouth, or a toothache) ?UNUSUAL RASH, SWELLING OR PAIN  ?UNUSUAL VAGINAL DISCHARGE OR ITCHING  ? ?Items with * indicate a potential emergency and should be followed up as soon as possible or go to the Emergency Department if any problems should occur. ? ?Please show the CHEMOTHERAPY ALERT CARD or IMMUNOTHERAPY ALERT CARD at  check-in to the Emergency Department and triage nurse. ?Should you have questions after your visit or need to cancel or reschedule your appointment, please contact [facility]  [phone] and follow the prompts.  Office hours are 8:00 a.m. to 4:30 p.m. Monday - Friday. Please note that voicemails left after 4:00 p.m. may not be returned until the following business day.  We are closed weekends and major holidays. You have access to a nurse at all times for urgent questions. Please call the main number to the clinic [phone] and follow the prompts. ? ?For any non-urgent questions, you may also contact your provider using MyChart. We now offer e-Visits for anyone 66 and older to request care online for non-urgent symptoms. For details visit [email or web address]. ?  ?Also download the MyChart app! Go to the app store, search "MyChart", open the app, select [facility], and log in with your MyChart username and password. ? ?Due to Covid, a mask is required upon entering the hospital/clinic. If you do not have a mask, one will be given to you upon arrival. For doctor visits, patients may have 1 support person aged 27 or older with them. For treatment visits, patients cannot have anyone with them due to current Covid guidelines and our immunocompromised population.  ?

## 2021-03-22 NOTE — Telephone Encounter (Signed)
Advised via MyChart.

## 2021-03-22 NOTE — Progress Notes (Signed)
Hematology and Oncology Follow Up Visit  [name] [id number] [birth year] 76 y.o. 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 def anemia -- malabsorption   Past Therapy: Zytiga 1000 mg by mouth daily - discontinued on 08/15/2016 Xtandi 160 mg po q day - start on 08/15/2016 - discontinued Radium-223 therapy -- s/p cycle #6 Palliative radiation to the right sacrum Lutathera - s/p cycle 4/4 --last dose on 07/11/2017 Carbo/VP-16/Tecentriq --  S/p cycle #5 Keytruda q 6 week -- Maintanence -- s/p cycle #3 - changed from 3 to 6 week on 06/30/2018 Taxotere/Keytruda -- start on 09/17/2018 -- s/p cycle #10 -- d/c due to progression   Current Therapy:        Lurbinectedin -- started on 04/29/2019, s/p cycle 17 -- d/c on 05/2020 due to progression CDDP/Irinotecan -- s/p cycle #8-- start on 06/15/2020 Xgeva 120 mg subcutaneous Q3 months - due in 04/2021 Lupron 22 mg IM every 3 months - due in 04/2021  SBRT to sacral lesion  IV iron as indicated    Interim History:  [name] is here today for follow-up.  He does look quite good.  The big news is that he is wife will be going down to [place] for a week after Easter.  I am sure that he will have a wonderful time down there.  He feels good.  He has had no problems with fever.  He has had no cough or shortness of breath.  He has had no nausea or vomiting.  There has been no change in bowel or bladder habits.  He has had no problems with hematuria.  His last Chromogranin A level was 360.  This does tend to fluctuate.  There is been no problems with his right lower leg.  He has a brace on.  He has had no headache.  Overall, I would say his performance status is ECOG 1.  He has had no urinary issues.  Has been no hematuria.  Overall, I would say his performance status is ECOG 1.     Medications:  Allergies as of 03/22/2021        Reactions   Codeine Nausea And Vomiting   Hydrocodone Nausea And Vomiting        Medication List        Accurate as of March 22, 2021  8:23 AM. If you have any questions, ask your nurse or doctor.          acetaminophen 500 MG tablet Commonly known as: TYLENOL Take 1 tablet (500 mg total) by mouth every 4 (four) hours as needed for mild pain, headache or fever (fever >/= 101).   Anti-Diarrheal 2 MG tablet Generic drug: loperamide Take 2 tablets by mouth at diarrhea onset, then 1 tablet every 2 hours until 12 hours with no bowel movment. May take 2 tablets every 4 hours at night. If diarrhea recurs repeat.   ascorbic acid 500 MG tablet Commonly known as: VITAMIN C Take 1 tablet (500 mg total) by mouth daily.   aspirin 81 MG tablet Take 81 mg by mouth daily.   clindamycin 300 MG capsule Commonly known as: CLEOCIN Take 1 (one) Capsule by mouth three times daily, take with food   dexamethasone 4 MG tablet Commonly known as: DECADRON Take 2 tablets (8 mg total) by mouth daily for 3 days starting the day after cisplatin chemotherapy. Take with food. What changed: Another medication  with the same name was removed. Continue taking this medication, and follow the directions you see here. Changed by: [name], MD   ezetimibe 10 MG tablet Commonly known as: ZETIA Take 1 tablet by mouth daily   Generlac 10 GM/15ML Soln Generic drug: lactulose (encephalopathy) Take 15 mLs (10 g total) by mouth daily as needed for constipation.   isosorbide mononitrate 30 MG 24 hr tablet Commonly known as: IMDUR TAKE 1 TABLET (30 MG TOTAL) BY MOUTH DAILY. What changed: how much to take   lidocaine-prilocaine cream Commonly known as: EMLA Apply to affected area once What changed:  how much to take how to take this when to take this additional instructions   ondansetron 8 MG tablet Commonly known as: Zofran Take 1 tablet (8 mg total) by mouth 2 (two) times daily as needed.  Start on the third day after cisplatin chemotherapy.   OVER THE COUNTER MEDICATION 1 each by Other route See admin instructions. Juice Plus -- 8 capsule of each the garden, vineyard, and orchard twice a day.   pantoprazole 40 MG tablet Commonly known as: PROTONIX TAKE 1 TABLET (40 MG TOTAL) BY MOUTH AT BEDTIME. What changed: how much to take   prochlorperazine 10 MG tablet Commonly known as: COMPAZINE Take 1 tablet (10 mg total) by mouth every 6 (six) hours as needed for nausea or vomiting.   solifenacin 10 MG tablet Commonly known as: VESIcare Take 1 tablet (10 mg total) by mouth daily.   Zinc 50 MG Caps Take 50 mg by mouth daily.        Allergies:  Allergies  Allergen Reactions   Codeine Nausea And Vomiting   Hydrocodone Nausea And Vomiting    Past Medical History, Surgical history, Social history, and Family History were reviewed and updated.  Review of Systems: Review of Systems  Constitutional: Negative.   HENT: Negative.    Eyes: Negative.   Respiratory: Negative.    Cardiovascular: Negative.   Gastrointestinal: Negative.   Genitourinary: Negative.   Musculoskeletal:  Positive for joint pain.  Skin: Negative.   Neurological:  Positive for focal weakness.  Endo/Heme/Allergies: Negative.   Psychiatric/Behavioral: Negative.      Physical Exam:  height is 5\' 8"  (1.727 m) and weight is 143 lb 1.9 oz (64.9 kg). His oral temperature is 98.3 F (36.8 C). His blood pressure is 107/67 and his pulse is 89. His respiration is 17 and oxygen saturation is 100%.   Wt Readings from Last 3 Encounters:  03/22/21 143 lb 1.9 oz (64.9 kg)  03/01/21 147 lb (66.7 kg)  02/08/21 147 lb (66.7 kg)    Physical Exam Vitals reviewed.  HENT:     Head: Normocephalic and atraumatic.  Eyes:     Pupils: Pupils are equal, round, and reactive to light.  Cardiovascular:     Rate and Rhythm: Normal rate and regular rhythm.     Heart sounds: Normal heart sounds.  Pulmonary:      Effort: Pulmonary effort is normal.     Breath sounds: Normal breath sounds.  Abdominal:     General: Bowel sounds are normal.     Palpations: Abdomen is soft.  Musculoskeletal:        General: No tenderness or deformity. Normal range of motion.     Cervical back: Normal range of motion.  Lymphadenopathy:     Cervical: No cervical adenopathy.  Skin:    General: Skin is warm and dry.     Findings: No erythema  or rash.  Neurological:     Mental Status: He is alert and oriented to person, place, and time.  Psychiatric:        Behavior: Behavior normal.        Thought Content: Thought content normal.        Judgment: Judgment normal.   Lab Results  Component Value Date   WBC 8.5 03/22/2021   HGB 10.3 (L) 03/22/2021   HCT 30.8 (L) 03/22/2021   MCV 97.5 03/22/2021   PLT 216 03/22/2021   Lab Results  Component Value Date   FERRITIN 1,122 (H) 03/01/2021   IRON 87 03/01/2021   TIBC 249 (L) 03/01/2021   UIBC 162 03/01/2021   IRONPCTSAT 35 03/01/2021   Lab Results  Component Value Date   RETICCTPCT 0.8 03/22/2021   RBC 3.16 (L) 03/22/2021   RBC 3.15 (L) 03/22/2021   No results found for: KPAFRELGTCHN, LAMBDASER, KAPLAMBRATIO No results found for: IGGSERUM, IGA, IGMSERUM No results found for: Kathrynn Ducking, MSPIKE, SPEI   Chemistry      Component Value Date/Time   NA 136 03/01/2021 0805   NA 139 10/19/2020 1444   NA 144 12/14/2016 1159   NA 140 10/22/2016 1129   K 3.8 03/01/2021 0805   K 3.5 12/14/2016 1159   K 4.3 10/22/2016 1129   CL 103 03/01/2021 0805   CL 103 12/14/2016 1159   CO2 24 03/01/2021 0805   CO2 30 12/14/2016 1159   CO2 27 10/22/2016 1129   BUN 31 (H) 03/01/2021 0805   BUN 17 10/19/2020 1444   BUN 16 12/14/2016 1159   BUN 14.0 10/22/2016 1129   CREATININE 1.78 (H) 03/01/2021 0805   CREATININE 1.3 (H) 12/14/2016 1159   CREATININE 1.1 10/22/2016 1129      Component Value Date/Time   CALCIUM 9.2  03/01/2021 0805   CALCIUM 10.0 12/14/2016 1159   CALCIUM 10.4 10/22/2016 1129   ALKPHOS 58 03/01/2021 0805   ALKPHOS 48 12/14/2016 1159   ALKPHOS 60 10/22/2016 1129   AST 11 (L) 03/01/2021 0805   AST 16 10/22/2016 1129   ALT 8 03/01/2021 0805   ALT 23 12/14/2016 1159   ALT 14 10/22/2016 1129   BILITOT 0.3 03/01/2021 0805   BILITOT 0.31 10/22/2016 1129       Impression and Plan: [name] is 76 yo caucasian gentleman with metastatic neuroendocrine carcinoma of unknown primary.    We will go ahead and give him his 10th cycle of treatment.  So happy for him.  His quality of life is doing so well right now.  He is very pleased with what he can do.  He is looking forward to going to [place].  We do not have to transfuse him.  We transfused him back in February.  This really did help him.  We may have to do this before he goes to [place] but we will see how that looks in April.  We will plan to get him back in 3 weeks.  He is not due for Lupron or Xgeva until April.  .     3/8/20238:23 AM

## 2021-03-22 NOTE — Telephone Encounter (Signed)
-----   Message from [name], MD sent at 03/22/2021  3:24 PM EST ----- ?Please call and let him know that the iron studies are okay.  Thanks.  Pete ?

## 2021-03-22 NOTE — Patient Instructions (Signed)

## 2021-03-22 NOTE — Progress Notes (Signed)
Ok to treat with creatinine of 1.61 per Dr Marin Olp. dph ?

## 2021-03-23 LAB — CHROMOGRANIN A: Chromogranin A (ng/mL): 636.5 ng/mL — ABNORMAL HIGH (ref 0.0–101.8)

## 2021-04-03 ENCOUNTER — Other Ambulatory Visit: Payer: Self-pay | Admitting: Hematology & Oncology

## 2021-04-03 ENCOUNTER — Encounter: Payer: Self-pay | Admitting: Hematology & Oncology

## 2021-04-03 ENCOUNTER — Other Ambulatory Visit (HOSPITAL_BASED_OUTPATIENT_CLINIC_OR_DEPARTMENT_OTHER): Payer: Self-pay

## 2021-04-03 MED ORDER — SOLIFENACIN SUCCINATE 10 MG PO TABS
10.0000 mg | ORAL_TABLET | Freq: Every day | ORAL | 1 refills | Status: AC
  Filled 2021-04-03: qty 30, 30d supply, fill #0
  Filled 2021-05-10: qty 30, 30d supply, fill #1

## 2021-04-12 ENCOUNTER — Inpatient Hospital Stay: Payer: Medicare Other

## 2021-04-12 ENCOUNTER — Other Ambulatory Visit: Payer: Self-pay

## 2021-04-12 ENCOUNTER — Encounter: Payer: Self-pay | Admitting: Hematology & Oncology

## 2021-04-12 ENCOUNTER — Inpatient Hospital Stay (HOSPITAL_BASED_OUTPATIENT_CLINIC_OR_DEPARTMENT_OTHER): Payer: Medicare Other | Admitting: Hematology & Oncology

## 2021-04-12 VITALS — BP 138/75 | HR 90

## 2021-04-12 VITALS — BP 131/80 | HR 97 | Temp 97.9°F | Resp 18 | Wt 138.0 lb

## 2021-04-12 DIAGNOSIS — C7A8 Other malignant neuroendocrine tumors: Secondary | ICD-10-CM

## 2021-04-12 DIAGNOSIS — R634 Abnormal weight loss: Secondary | ICD-10-CM | POA: Diagnosis not present

## 2021-04-12 DIAGNOSIS — C7951 Secondary malignant neoplasm of bone: Secondary | ICD-10-CM

## 2021-04-12 DIAGNOSIS — Z5111 Encounter for antineoplastic chemotherapy: Secondary | ICD-10-CM | POA: Diagnosis not present

## 2021-04-12 DIAGNOSIS — M2550 Pain in unspecified joint: Secondary | ICD-10-CM | POA: Diagnosis not present

## 2021-04-12 DIAGNOSIS — D509 Iron deficiency anemia, unspecified: Secondary | ICD-10-CM | POA: Diagnosis not present

## 2021-04-12 DIAGNOSIS — R531 Weakness: Secondary | ICD-10-CM | POA: Diagnosis not present

## 2021-04-12 DIAGNOSIS — C61 Malignant neoplasm of prostate: Secondary | ICD-10-CM | POA: Diagnosis not present

## 2021-04-12 LAB — SAMPLE TO BLOOD BANK

## 2021-04-12 LAB — RETICULOCYTES
Immature Retic Fract: 14.1 % (ref 2.3–15.9)
RBC.: 2.75 MIL/uL — ABNORMAL LOW (ref 4.22–5.81)
Retic Count, Absolute: 40.7 10*3/uL (ref 19.0–186.0)
Retic Ct Pct: 1.5 % (ref 0.4–3.1)

## 2021-04-12 LAB — CBC WITH DIFFERENTIAL (CANCER CENTER ONLY)
Abs Immature Granulocytes: 0.1 10*3/uL — ABNORMAL HIGH (ref 0.00–0.07)
Basophils Absolute: 0.1 10*3/uL (ref 0.0–0.1)
Basophils Relative: 1 %
Eosinophils Absolute: 0.2 10*3/uL (ref 0.0–0.5)
Eosinophils Relative: 3 %
HCT: 27.4 % — ABNORMAL LOW (ref 39.0–52.0)
Hemoglobin: 9.1 g/dL — ABNORMAL LOW (ref 13.0–17.0)
Immature Granulocytes: 2 %
Lymphocytes Relative: 16 %
Lymphs Abs: 1.1 10*3/uL (ref 0.7–4.0)
MCH: 33.3 pg (ref 26.0–34.0)
MCHC: 33.2 g/dL (ref 30.0–36.0)
MCV: 100.4 fL — ABNORMAL HIGH (ref 80.0–100.0)
Monocytes Absolute: 1.1 10*3/uL — ABNORMAL HIGH (ref 0.1–1.0)
Monocytes Relative: 16 %
Neutro Abs: 4.2 10*3/uL (ref 1.7–7.7)
Neutrophils Relative %: 62 %
Platelet Count: 260 10*3/uL (ref 150–400)
RBC: 2.73 MIL/uL — ABNORMAL LOW (ref 4.22–5.81)
RDW: 17 % — ABNORMAL HIGH (ref 11.5–15.5)
WBC Count: 6.7 10*3/uL (ref 4.0–10.5)
nRBC: 0 % (ref 0.0–0.2)

## 2021-04-12 LAB — CMP (CANCER CENTER ONLY)
ALT: 8 U/L (ref 0–44)
AST: 12 U/L — ABNORMAL LOW (ref 15–41)
Albumin: 4.2 g/dL (ref 3.5–5.0)
Alkaline Phosphatase: 61 U/L (ref 38–126)
Anion gap: 9 (ref 5–15)
BUN: 33 mg/dL — ABNORMAL HIGH (ref 8–23)
CO2: 25 mmol/L (ref 22–32)
Calcium: 10 mg/dL (ref 8.9–10.3)
Chloride: 101 mmol/L (ref 98–111)
Creatinine: 1.73 mg/dL — ABNORMAL HIGH (ref 0.61–1.24)
GFR, Estimated: 41 mL/min — ABNORMAL LOW (ref >60)
Glucose, Bld: 98 mg/dL (ref 70–99)
Potassium: 4 mmol/L (ref 3.5–5.1)
Sodium: 135 mmol/L (ref 135–145)
Total Bilirubin: 0.3 mg/dL (ref 0.3–1.2)
Total Protein: 6.4 g/dL — ABNORMAL LOW (ref 6.5–8.1)

## 2021-04-12 LAB — IRON AND IRON BINDING CAPACITY (CC-WL,HP ONLY)
Iron: 88 ug/dL (ref 45–182)
Saturation Ratios: 36 % (ref 17.9–39.5)
TIBC: 242 ug/dL — ABNORMAL LOW (ref 250–450)
UIBC: 154 ug/dL (ref 117–376)

## 2021-04-12 LAB — MAGNESIUM: Magnesium: 2.2 mg/dL (ref 1.7–2.4)

## 2021-04-12 LAB — LACTATE DEHYDROGENASE: LDH: 128 U/L (ref 98–192)

## 2021-04-12 LAB — SAVE SMEAR(SSMR), FOR PROVIDER SLIDE REVIEW

## 2021-04-12 LAB — FERRITIN: Ferritin: 1473 ng/mL — ABNORMAL HIGH (ref 24–336)

## 2021-04-12 MED ORDER — POTASSIUM CHLORIDE IN NACL 20-0.9 MEQ/L-% IV SOLN
Freq: Once | INTRAVENOUS | Status: AC
  Filled 2021-04-12: qty 1000

## 2021-04-12 MED ORDER — SODIUM CHLORIDE 0.9 % IV SOLN
10.0000 mg | Freq: Once | INTRAVENOUS | Status: AC
  Administered 2021-04-12: 10 mg via INTRAVENOUS
  Filled 2021-04-12: qty 10

## 2021-04-12 MED ORDER — SODIUM CHLORIDE 0.9 % IV SOLN
27.0000 mg/m2 | Freq: Once | INTRAVENOUS | Status: AC
  Administered 2021-04-12: 50 mg via INTRAVENOUS
  Filled 2021-04-12: qty 50

## 2021-04-12 MED ORDER — SODIUM CHLORIDE 0.9 % IV SOLN: Freq: Once | INTRAVENOUS | Status: AC

## 2021-04-12 MED ORDER — PALONOSETRON HCL INJECTION 0.25 MG/5ML
0.2500 mg | Freq: Once | INTRAVENOUS | Status: AC
  Administered 2021-04-12: 0.25 mg via INTRAVENOUS
  Filled 2021-04-12: qty 5

## 2021-04-12 MED ORDER — HEPARIN SOD (PORK) LOCK FLUSH 100 UNIT/ML IV SOLN
500.0000 [IU] | Freq: Once | INTRAVENOUS | Status: AC | PRN
  Administered 2021-04-12: 500 [IU]

## 2021-04-12 MED ORDER — PEGFILGRASTIM 6 MG/0.6ML ~~LOC~~ PSKT
6.0000 mg | PREFILLED_SYRINGE | Freq: Once | SUBCUTANEOUS | Status: AC
  Administered 2021-04-12: 6 mg via SUBCUTANEOUS
  Filled 2021-04-12: qty 0.6

## 2021-04-12 MED ORDER — SODIUM CHLORIDE 0.9 % IV SOLN
150.0000 mg | Freq: Once | INTRAVENOUS | Status: AC
  Administered 2021-04-12: 150 mg via INTRAVENOUS
  Filled 2021-04-12: qty 150

## 2021-04-12 MED ORDER — MAGNESIUM SULFATE 2 GM/50ML IV SOLN
2.0000 g | Freq: Once | INTRAVENOUS | Status: AC
  Administered 2021-04-12: 2 g via INTRAVENOUS
  Filled 2021-04-12: qty 50

## 2021-04-12 MED ORDER — ATROPINE SULFATE 1 MG/ML IV SOLN
0.5000 mg | Freq: Once | INTRAVENOUS | Status: AC | PRN
  Administered 2021-04-12: 0.5 mg via INTRAVENOUS
  Filled 2021-04-12: qty 1

## 2021-04-12 MED ORDER — SODIUM CHLORIDE 0.9 % IV SOLN
65.0000 mg/m2 | Freq: Once | INTRAVENOUS | Status: AC
  Administered 2021-04-12: 120 mg via INTRAVENOUS
  Filled 2021-04-12: qty 6

## 2021-04-12 MED ORDER — SODIUM CHLORIDE 0.9% FLUSH
10.0000 mL | INTRAVENOUS | Status: DC | PRN
  Administered 2021-04-12: 10 mL

## 2021-04-12 NOTE — Progress Notes (Signed)
Ok to give chemo with todays labs including serum creatinine of 1.73  per Dr Marin Olp ?

## 2021-04-12 NOTE — Patient Instructions (Addendum)
[facility]  Discharge Instructions: ?Thank you for choosing [facility] to provide your oncology and hematology care.  ? ?If you have a lab appointment with the [facility], please go directly to the [facility] and check in at the registration area. ? ?Wear comfortable clothing and clothing appropriate for easy access to any Portacath or PICC line.  ? ?We strive to give you quality time with your provider. You may need to reschedule your appointment if you arrive late (15 or more minutes).  Arriving late affects you and other patients whose appointments are after yours.  Also, if you miss three or more appointments without notifying the office, you may be dismissed from the clinic at the provider?s discretion.    ?  ?For prescription refill requests, have your pharmacy contact our office and allow 72 hours for refills to be completed.   ? ?Today you received the following chemotherapy and/or immunotherapy agents Cisplatin, Irinotecan    ?  ?To help prevent nausea and vomiting after your treatment, we encourage you to take your nausea medication as directed. ? ?BELOW ARE SYMPTOMS THAT SHOULD BE REPORTED IMMEDIATELY: ?*FEVER GREATER THAN 100.4 F (38 ?C) OR HIGHER ?*CHILLS OR SWEATING ?*NAUSEA AND VOMITING THAT IS NOT CONTROLLED WITH YOUR NAUSEA MEDICATION ?*UNUSUAL SHORTNESS OF BREATH ?*UNUSUAL BRUISING OR BLEEDING ?*URINARY PROBLEMS (pain or burning when urinating, or frequent urination) ?*BOWEL PROBLEMS (unusual diarrhea, constipation, pain near the anus) ?TENDERNESS IN MOUTH AND THROAT WITH OR WITHOUT PRESENCE OF ULCERS (sore throat, sores in mouth, or a toothache) ?UNUSUAL RASH, SWELLING OR PAIN  ?UNUSUAL VAGINAL DISCHARGE OR ITCHING  ? ?Items with * indicate a potential emergency and should be followed up as soon as possible or go to the Emergency Department if any problems should occur. ? ?Please show the CHEMOTHERAPY ALERT CARD or IMMUNOTHERAPY ALERT CARD at  check-in to the Emergency Department and triage nurse. ?Should you have questions after your visit or need to cancel or reschedule your appointment, please contact [facility]  [phone] and follow the prompts.  Office hours are 8:00 a.m. to 4:30 p.m. Monday - Friday. Please note that voicemails left after 4:00 p.m. may not be returned until the following business day.  We are closed weekends and major holidays. You have access to a nurse at all times for urgent questions. Please call the main number to the clinic [phone] and follow the prompts. ? ?For any non-urgent questions, you may also contact your provider using MyChart. We now offer e-Visits for anyone 21 and older to request care online for non-urgent symptoms. For details visit [email or web address]. ?  ?Also download the MyChart app! Go to the app store, search "MyChart", open the app, select [facility], and log in with your MyChart username and password. ? ?Due to Covid, a mask is required upon entering the hospital/clinic. If you do not have a mask, one will be given to you upon arrival. For doctor visits, patients may have 1 support person aged 63 or older with them. For treatment visits, patients cannot have anyone with them due to current Covid guidelines and our immunocompromised population. [facility]  Discharge Instructions: ?Thank you for choosing [facility] to provide your oncology and hematology care.  ? ?If you have a lab appointment with the [facility], please go directly to the [facility] and check in at the registration area. ? ?Wear comfortable  clothing and clothing appropriate for easy access to any Portacath or PICC line.  ? ?We strive to give you quality time with your provider. You may need to reschedule your appointment if you arrive late (15 or more minutes).  Arriving late affects you and other patients whose appointments are after yours.  Also,  if you miss three or more appointments without notifying the office, you may be dismissed from the clinic at the provider?s discretion.    ?  ?For prescription refill requests, have your pharmacy contact our office and allow 72 hours for refills to be completed.   ? ?Today you received the following chemotherapy and/or immunotherapy agents cpt-11, cisplatin  ?  ?To help prevent nausea and vomiting after your treatment, we encourage you to take your nausea medication as directed. ? ?BELOW ARE SYMPTOMS THAT SHOULD BE REPORTED IMMEDIATELY: ?*FEVER GREATER THAN 100.4 F (38 ?C) OR HIGHER ?*CHILLS OR SWEATING ?*NAUSEA AND VOMITING THAT IS NOT CONTROLLED WITH YOUR NAUSEA MEDICATION ?*UNUSUAL SHORTNESS OF BREATH ?*UNUSUAL BRUISING OR BLEEDING ?*URINARY PROBLEMS (pain or burning when urinating, or frequent urination) ?*BOWEL PROBLEMS (unusual diarrhea, constipation, pain near the anus) ?TENDERNESS IN MOUTH AND THROAT WITH OR WITHOUT PRESENCE OF ULCERS (sore throat, sores in mouth, or a toothache) ?UNUSUAL RASH, SWELLING OR PAIN  ?UNUSUAL VAGINAL DISCHARGE OR ITCHING  ? ?Items with * indicate a potential emergency and should be followed up as soon as possible or go to the Emergency Department if any problems should occur. ? ?Please show the CHEMOTHERAPY ALERT CARD or IMMUNOTHERAPY ALERT CARD at check-in to the Emergency Department and triage nurse. ?Should you have questions after your visit or need to cancel or reschedule your appointment, please contact [facility]  [phone] and follow the prompts.  Office hours are 8:00 a.m. to 4:30 p.m. Monday - Friday. Please note that voicemails left after 4:00 p.m. may not be returned until the following business day.  We are closed weekends and major holidays. You have access to a nurse at all times for urgent questions. Please call the main number to the clinic [phone] and follow the prompts. ? ?For any non-urgent questions, you may also contact  your provider using MyChart. We now offer e-Visits for anyone 56 and older to request care online for non-urgent symptoms. For details visit [email or web address]. ?  ?Also download the MyChart app! Go to the app store, search "MyChart", open the app, select [facility], and log in with your MyChart username and password. ? ?Due to Covid, a mask is required upon entering the hospital/clinic. If you do not have a mask, one will be given to you upon arrival. For doctor visits, patients may have 1 support person aged 54 or older with them. For treatment visits, patients cannot have anyone with them due to current Covid guidelines and our immunocompromised population.  ?

## 2021-04-12 NOTE — Progress Notes (Signed)
Reviewed with MD if pt to receive Aranesp every visit per Treatment plan. MD advised he will not get aranesp today, recheck pts labs prior to leaving for [place] and then give at that time. Discussed with pt who verbalized understanding. No further concerns at this time ?

## 2021-04-12 NOTE — Progress Notes (Signed)
?Hematology and Oncology Follow Up Visit ? ?[name] ?[id number] ?08/19/1945 76 y.o. ?04/12/2021 ? ? 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  ?Past Therapy: ?Zytiga 1000 mg by mouth daily - discontinued on 08/15/2016 ?Xtandi 160 mg po q day - start on 08/15/2016 - discontinued ?Radium-223 therapy -- s/p cycle #6 ?Palliative radiation to the right sacrum ?Lutathera - s/p cycle 4/4 --last dose on 07/11/2017 ?Carbo/VP-16/Tecentriq --  S/p cycle #5 ?Keytruda q 6 week -- Maintanence -- s/p cycle #3 - changed from 3 to 6 week on 06/30/2018 ?Taxotere/Keytruda -- start on 09/17/2018 -- s/p cycle #10 -- d/c due to progression ?  ?Current Therapy:        ?Lurbinectedin -- started on 04/29/2019, s/p cycle 17 -- d/c on 05/2020 due to progression ?CDDP/Irinotecan -- s/p cycle #10-- start on 06/15/2020 ?Xgeva 120 mg subcutaneous Q3 months - due in 07/2021 ?Lupron 22 mg IM every 3 months - due in 07/2021  ?SBRT to sacral lesion ? ?IV iron as indicated  ?  ?Interim History:  [name] is here today for follow-up.  Well-controlled by his weight loss.  He is lost a little bit of weight since we last saw him.  He says he is eating okay.  He is having no problems with nausea or vomiting.  He is having a little bit of diarrhea but Imodium seems to help. ? ?He does not have any issues with pain. ? ?His last Chromogranin A level back in early March was 636 which is up for him a little bit. ? ?He and his wife are still planning on going to [place] after Easter.  1 thing I want to make sure of is that his blood counts go to be okay when he does go down to [place]. ? ?He has not noted any headache.  There is been no cough or shortness of breath.  He has had no wheezing.  He has had no leg swelling.  He does have the brace on the right lower leg. ? ?Currently, I would say his performance status is probably  ECOG 1.   ? ? ?Medications:  ?Allergies as of 04/12/2021   ? ?   Reactions  ? Codeine Nausea And Vomiting  ? Hydrocodone Nausea And Vomiting  ? ?  ? ?  ?Medication List  ?  ? ?  ? Accurate as of April 12, 2021  8:36 AM. If you have any questions, ask your nurse or doctor.  ?  ?  ? ?  ? ?STOP taking these medications   ? ?clindamycin 300 MG capsule ?Commonly known as: CLEOCIN ?Stopped by: [name], MD ?  ?Zinc 50 MG Caps ?Stopped by: [name], MD ?  ? ?  ? ?TAKE these medications   ? ?acetaminophen 500 MG tablet ?Commonly known as: TYLENOL ?Take 1 tablet (500 mg total) by mouth every 4 (four) hours as needed for mild pain, headache or fever (fever >/= 101). ?  ?Anti-Diarrheal 2 MG tablet ?Generic drug: loperamide ?Take 2 tablets by mouth at diarrhea onset, then 1 tablet every 2 hours until 12 hours with no bowel movment. May take 2 tablets every 4 hours at night. If diarrhea recurs repeat. ?  ?ascorbic acid 500 MG tablet ?Commonly known as: VITAMIN C ?Take 1 tablet (500 mg total) by mouth daily. ?  ?aspirin 81 MG tablet ?Take 81 mg by mouth  daily. ?  ?dexamethasone 4 MG tablet ?Commonly known as: DECADRON ?Take 2 tablets (8 mg total) by mouth daily for 3 days starting the day after cisplatin chemotherapy. Take with food. ?  ?ezetimibe 10 MG tablet ?Commonly known as: ZETIA ?Take 1 tablet by mouth daily ?  ?Generlac 10 GM/15ML Soln ?Generic drug: lactulose (encephalopathy) ?Take 15 mLs (10 g total) by mouth daily as needed for constipation. ?  ?isosorbide mononitrate 30 MG 24 hr tablet ?Commonly known as: IMDUR ?TAKE 1 TABLET (30 MG TOTAL) BY MOUTH DAILY. ?What changed: how much to take ?  ?lidocaine-prilocaine cream ?Commonly known as: EMLA ?Apply to affected area once ?What changed:  ?how much to take ?how to take this ?when to take this ?additional instructions ?  ?ondansetron 8 MG tablet ?Commonly known as: Zofran ?Take 1 tablet (8 mg total) by mouth 2 (two) times daily as needed. Start on the third  day after cisplatin chemotherapy. ?  ?OVER THE COUNTER MEDICATION ?1 each by Other route See admin instructions. Juice Plus -- 8 capsule of each the garden, vineyard, and orchard twice a day. ?  ?pantoprazole 40 MG tablet ?Commonly known as: PROTONIX ?TAKE 1 TABLET (40 MG TOTAL) BY MOUTH AT BEDTIME. ?What changed: how much to take ?  ?prochlorperazine 10 MG tablet ?Commonly known as: COMPAZINE ?Take 1 tablet (10 mg total) by mouth every 6 (six) hours as needed for nausea or vomiting. ?  ?solifenacin 10 MG tablet ?Commonly known as: VESIcare ?Take 1 tablet (10 mg total) by mouth daily. ?  ? ?  ? ? ?Allergies:  ?Allergies  ?Allergen Reactions  ? Codeine Nausea And Vomiting  ? Hydrocodone Nausea And Vomiting  ? ? ?Past Medical History, Surgical history, Social history, and Family History were reviewed and updated. ? ?Review of Systems: ?Review of Systems  ?Constitutional: Negative.   ?HENT: Negative.    ?Eyes: Negative.   ?Respiratory: Negative.    ?Cardiovascular: Negative.   ?Gastrointestinal: Negative.   ?Genitourinary: Negative.   ?Musculoskeletal:  Positive for joint pain.  ?Skin: Negative.   ?Neurological:  Positive for focal weakness.  ?Endo/Heme/Allergies: Negative.   ?Psychiatric/Behavioral: Negative.    ? ? ?Physical Exam: ? weight is 138 lb (62.6 kg). His oral temperature is 97.9 ?F (36.6 ?C). His blood pressure is 131/80 and his pulse is 97. His respiration is 18 and oxygen saturation is 100%.  ? ?Wt Readings from Last 3 Encounters:  ?04/12/21 138 lb (62.6 kg)  ?03/22/21 143 lb 1.9 oz (64.9 kg)  ?03/01/21 147 lb (66.7 kg)  ? ? ?Physical Exam ?Vitals reviewed.  ?HENT:  ?   Head: Normocephalic and atraumatic.  ?Eyes:  ?   Pupils: Pupils are equal, round, and reactive to light.  ?Cardiovascular:  ?   Rate and Rhythm: Normal rate and regular rhythm.  ?   Heart sounds: Normal heart sounds.  ?Pulmonary:  ?   Effort: Pulmonary effort is normal.  ?   Breath sounds: Normal breath sounds.  ?Abdominal:  ?   General:  Bowel sounds are normal.  ?   Palpations: Abdomen is soft.  ?Musculoskeletal:     ?   General: No tenderness or deformity. Normal range of motion.  ?   Cervical back: Normal range of motion.  ?Lymphadenopathy:  ?   Cervical: No cervical adenopathy.  ?Skin: ?   General: Skin is warm and dry.  ?   Findings: No erythema or rash.  ?Neurological:  ?   Mental Status: He is  alert and oriented to person, place, and time.  ?Psychiatric:     ?   Behavior: Behavior normal.     ?   Thought Content: Thought content normal.     ?   Judgment: Judgment normal.  ? ?Lab Results  ?Component Value Date  ? WBC 6.7 04/12/2021  ? HGB 9.1 (L) 04/12/2021  ? HCT 27.4 (L) 04/12/2021  ? MCV 100.4 (H) 04/12/2021  ? PLT 260 04/12/2021  ? ?Lab Results  ?Component Value Date  ? FERRITIN 1,542 (H) 03/22/2021  ? IRON 87 03/22/2021  ? TIBC 231 (L) 03/22/2021  ? UIBC 144 03/22/2021  ? IRONPCTSAT 38 03/22/2021  ? ?Lab Results  ?Component Value Date  ? RETICCTPCT 1.5 04/12/2021  ? RBC 2.75 (L) 04/12/2021  ? ?No results found for: KPAFRELGTCHN, LAMBDASER, KAPLAMBRATIO ?No results found for: IGGSERUM, IGA, IGMSERUM ?No results found for: TOTALPROTELP, ALBUMINELP, A1GS, A2GS, BETS, BETA2SER, GAMS, MSPIKE, SPEI ?  Chemistry   ?   ?Component Value Date/Time  ? NA 136 03/22/2021 0805  ? NA 139 10/19/2020 1444  ? NA 144 12/14/2016 1159  ? NA 140 10/22/2016 1129  ? K 4.1 03/22/2021 0805  ? K 3.5 12/14/2016 1159  ? K 4.3 10/22/2016 1129  ? CL 101 03/22/2021 0805  ? CL 103 12/14/2016 1159  ? CO2 26 03/22/2021 0805  ? CO2 30 12/14/2016 1159  ? CO2 27 10/22/2016 1129  ? BUN 25 (H) 03/22/2021 0805  ? BUN 17 10/19/2020 1444  ? BUN 16 12/14/2016 1159  ? BUN 14.0 10/22/2016 1129  ? CREATININE 1.61 (H) 03/22/2021 0805  ? CREATININE 1.3 (H) 12/14/2016 1159  ? CREATININE 1.1 10/22/2016 1129  ?    ?Component Value Date/Time  ? CALCIUM 9.0 03/22/2021 0805  ? CALCIUM 10.0 12/14/2016 1159  ? CALCIUM 10.4 10/22/2016 1129  ? ALKPHOS 66 03/22/2021 0805  ? ALKPHOS 48 12/14/2016  1159  ? ALKPHOS 60 10/22/2016 1129  ? AST 12 (L) 03/22/2021 0805  ? AST 16 10/22/2016 1129  ? ALT 7 03/22/2021 0805  ? ALT 23 12/14/2016 1159  ? ALT 14 10/22/2016 1129  ? BILITOT 0.3 03/22/2021 0805  ?

## 2021-04-12 NOTE — Patient Instructions (Signed)

## 2021-04-14 LAB — CHROMOGRANIN A: Chromogranin A (ng/mL): 1171 ng/mL — ABNORMAL HIGH (ref 0.0–101.8)

## 2021-04-16 ENCOUNTER — Emergency Department: Payer: Non-veteran care

## 2021-04-16 ENCOUNTER — Inpatient Hospital Stay
Admission: EM | Admit: 2021-04-16 | Discharge: 2021-04-19 | DRG: 292 | Disposition: A | Discharge status: Home / Self Care | Payer: Non-veteran care | Attending: Internal Medicine | Admitting: Internal Medicine

## 2021-04-16 DIAGNOSIS — Z596 Low income: Secondary | ICD-10-CM

## 2021-04-16 DIAGNOSIS — E871 Hypo-osmolality and hyponatremia: Secondary | ICD-10-CM | POA: Diagnosis present

## 2021-04-16 DIAGNOSIS — Z66 Do not resuscitate: Secondary | ICD-10-CM | POA: Diagnosis present

## 2021-04-16 DIAGNOSIS — F32A Depression, unspecified: Secondary | ICD-10-CM | POA: Diagnosis present

## 2021-04-16 DIAGNOSIS — N401 Enlarged prostate with lower urinary tract symptoms: Secondary | ICD-10-CM | POA: Diagnosis present

## 2021-04-16 DIAGNOSIS — Z7982 Long term (current) use of aspirin: Secondary | ICD-10-CM

## 2021-04-16 DIAGNOSIS — I480 Paroxysmal atrial fibrillation: Principal | ICD-10-CM

## 2021-04-16 DIAGNOSIS — Z794 Long term (current) use of insulin: Secondary | ICD-10-CM

## 2021-04-16 DIAGNOSIS — I240 Acute coronary thrombosis not resulting in myocardial infarction: Secondary | ICD-10-CM | POA: Diagnosis present

## 2021-04-16 DIAGNOSIS — I110 Hypertensive heart disease with heart failure: Secondary | ICD-10-CM | POA: Diagnosis present

## 2021-04-16 DIAGNOSIS — K5900 Constipation, unspecified: Secondary | ICD-10-CM | POA: Diagnosis not present

## 2021-04-16 DIAGNOSIS — R3914 Feeling of incomplete bladder emptying: Secondary | ICD-10-CM | POA: Diagnosis present

## 2021-04-16 DIAGNOSIS — G8929 Other chronic pain: Secondary | ICD-10-CM | POA: Diagnosis present

## 2021-04-16 DIAGNOSIS — F419 Anxiety disorder, unspecified: Secondary | ICD-10-CM | POA: Diagnosis present

## 2021-04-16 DIAGNOSIS — F17210 Nicotine dependence, cigarettes, uncomplicated: Secondary | ICD-10-CM | POA: Diagnosis present

## 2021-04-16 DIAGNOSIS — I4891 Unspecified atrial fibrillation: Secondary | ICD-10-CM | POA: Diagnosis present

## 2021-04-16 DIAGNOSIS — Z91148 Patient's other noncompliance with medication regimen for other reason: Secondary | ICD-10-CM

## 2021-04-16 DIAGNOSIS — I4510 Unspecified right bundle-branch block: Secondary | ICD-10-CM | POA: Diagnosis present

## 2021-04-16 DIAGNOSIS — F1021 Alcohol dependence, in remission: Secondary | ICD-10-CM | POA: Diagnosis present

## 2021-04-16 DIAGNOSIS — E785 Hyperlipidemia, unspecified: Secondary | ICD-10-CM | POA: Diagnosis present

## 2021-04-16 DIAGNOSIS — I272 Pulmonary hypertension, unspecified: Secondary | ICD-10-CM | POA: Diagnosis present

## 2021-04-16 DIAGNOSIS — I429 Cardiomyopathy, unspecified: Secondary | ICD-10-CM | POA: Diagnosis present

## 2021-04-16 DIAGNOSIS — K219 Gastro-esophageal reflux disease without esophagitis: Secondary | ICD-10-CM | POA: Diagnosis present

## 2021-04-16 DIAGNOSIS — Z20822 Contact with and (suspected) exposure to covid-19: Secondary | ICD-10-CM | POA: Diagnosis present

## 2021-04-16 DIAGNOSIS — I5020 Unspecified systolic (congestive) heart failure: Secondary | ICD-10-CM | POA: Diagnosis present

## 2021-04-16 DIAGNOSIS — Z91141 Patient's other noncompliance with medication regimen due to financial hardship: Secondary | ICD-10-CM

## 2021-04-16 DIAGNOSIS — M549 Dorsalgia, unspecified: Secondary | ICD-10-CM | POA: Diagnosis present

## 2021-04-16 DIAGNOSIS — E1165 Type 2 diabetes mellitus with hyperglycemia: Principal | ICD-10-CM

## 2021-04-16 DIAGNOSIS — I5023 Acute on chronic systolic (congestive) heart failure: Secondary | ICD-10-CM | POA: Diagnosis present

## 2021-04-16 DIAGNOSIS — E1142 Type 2 diabetes mellitus with diabetic polyneuropathy: Secondary | ICD-10-CM | POA: Diagnosis present

## 2021-04-16 DIAGNOSIS — G4733 Obstructive sleep apnea (adult) (pediatric): Secondary | ICD-10-CM | POA: Diagnosis present

## 2021-04-16 HISTORY — DX: Insomnia, unspecified: G47.00

## 2021-04-16 HISTORY — DX: Sleep apnea, unspecified: G47.30

## 2021-04-16 LAB — COMPREHENSIVE METABOLIC PANEL
ALT: 26 U/L (ref 0–55)
AST (SGOT): 23 U/L (ref 5–41)
Albumin/Globulin Ratio: 1.1 (ref 0.9–2.2)
Albumin: 3.3 g/dL — ABNORMAL LOW (ref 3.5–5.0)
Alkaline Phosphatase: 154 U/L — ABNORMAL HIGH (ref 37–117)
Anion Gap: 11 (ref 5.0–15.0)
BUN: 12 mg/dL (ref 9.0–28.0)
Bilirubin, Total: 0.8 mg/dL (ref 0.2–1.2)
CO2: 19 mEq/L (ref 17–29)
Calcium: 8.9 mg/dL (ref 7.9–10.2)
Chloride: 98 mEq/L — ABNORMAL LOW (ref 99–111)
Creatinine: 1.1 mg/dL (ref 0.5–1.5)
Globulin: 3 g/dL (ref 2.0–3.6)
Glucose: 449 mg/dL — ABNORMAL HIGH (ref 70–100)
Potassium: 4.6 mEq/L (ref 3.5–5.3)
Protein, Total: 6.3 g/dL (ref 6.0–8.3)
Sodium: 128 mEq/L — ABNORMAL LOW (ref 135–145)

## 2021-04-16 LAB — URINALYSIS REFLEX TO MICROSCOPIC EXAM - REFLEX TO CULTURE
Bilirubin, UA: NEGATIVE
Ketones UA: NEGATIVE
Leukocyte Esterase, UA: NEGATIVE
Nitrite, UA: NEGATIVE
Protein, UR: NEGATIVE
Specific Gravity UA: 1.005 (ref 1.001–1.035)
Urine pH: 5 (ref 5.0–8.0)
Urobilinogen, UA: NORMAL mg/dL (ref 0.2–2.0)

## 2021-04-16 LAB — GFR: EGFR: >60

## 2021-04-16 LAB — ECG 12-LEAD
Atrial Rate: 147 {beats}/min
Q-T Interval: 320 ms
QRS Duration: 140 ms
QTC Calculation (Bezet): 498 ms
R Axis: 120 degrees
T Axis: 23 degrees
Ventricular Rate: 146 {beats}/min

## 2021-04-16 LAB — COVID-19 (SARS-COV-2) & INFLUENZA  A/B, NAA (ROCHE LIAT)
Influenza A: NOT DETECTED
Influenza B: NOT DETECTED
SARS CoV 2 Overall Result: NOT DETECTED

## 2021-04-16 LAB — CBC AND DIFFERENTIAL
Absolute NRBC: 0 10*3/uL (ref 0.00–0.00)
Basophils Absolute Automated: 0.13 10*3/uL — ABNORMAL HIGH (ref 0.00–0.08)
Basophils Automated: 1.6 %
Eosinophils Absolute Automated: 0.28 10*3/uL (ref 0.00–0.44)
Eosinophils Automated: 3.5 %
Hematocrit: 46.6 % (ref 37.6–49.6)
Hgb: 15.2 g/dL (ref 12.5–17.1)
Immature Granulocytes Absolute: 0.04 10*3/uL (ref 0.00–0.07)
Immature Granulocytes: 0.5 %
Instrument Absolute Neutrophil Count: 4.54 10*3/uL (ref 1.10–6.33)
Lymphocytes Absolute Automated: 2.51 10*3/uL (ref 0.42–3.22)
Lymphocytes Automated: 31 %
MCH: 26.3 pg (ref 25.1–33.5)
MCHC: 32.6 g/dL (ref 31.5–35.8)
MCV: 80.8 fL (ref 78.0–96.0)
MPV: 10.5 fL (ref 8.9–12.5)
Monocytes Absolute Automated: 0.6 10*3/uL (ref 0.21–0.85)
Monocytes: 7.4 %
Neutrophils Absolute: 4.54 10*3/uL (ref 1.10–6.33)
Neutrophils: 56 %
Nucleated RBC: 0 /100 WBC (ref 0.0–0.0)
Platelets: 184 10*3/uL (ref 142–346)
RBC: 5.77 10*6/uL (ref 4.20–5.90)
RDW: 15 % (ref 11–15)
WBC: 8.1 10*3/uL (ref 3.10–9.50)

## 2021-04-16 LAB — ANTI-XA,UFH: Anti-Xa, UFH: <0.04 IU/mL

## 2021-04-16 LAB — IHS D-DIMER: D-Dimer: 0.68 ug/mL FEU — ABNORMAL HIGH (ref 0.00–0.60)

## 2021-04-16 LAB — PT AND APTT
PT INR: 1 (ref 0.9–1.1)
PT: 11.6 s (ref 10.1–12.9)
PTT: 29 s (ref 27–39)

## 2021-04-16 LAB — GLUCOSE WHOLE BLOOD - POCT
Whole Blood Glucose POCT: 367 mg/dL — ABNORMAL HIGH (ref 70–100)
Whole Blood Glucose POCT: 321 mg/dL — ABNORMAL HIGH (ref 70–100)

## 2021-04-16 LAB — HIGH SENSITIVITY TROPONIN-I
hs Troponin-I: 31 ng/L
hs Troponin-I: 32.9 ng/L

## 2021-04-16 LAB — TSH: TSH: 0.98 u[IU]/mL (ref 0.35–4.94)

## 2021-04-16 LAB — PROBNP: NT-proBNP: 4162 pg/mL — ABNORMAL HIGH (ref 0–125)

## 2021-04-16 LAB — MAGNESIUM: Magnesium: 1.7 mg/dL (ref 1.6–2.6)

## 2021-04-16 LAB — APTT: PTT: 27 s (ref 27–39)

## 2021-04-16 MED ORDER — HEPARIN SODIUM (PORCINE) 5000 UNIT/ML IJ SOLN
3000.0000 [IU] | INTRAMUSCULAR | Status: DC | PRN
Start: 2021-04-16 — End: 2021-04-19

## 2021-04-16 MED ORDER — INSULIN LISPRO 100 UNIT/ML SOLN (WRAP)
1.0000 [IU] | Status: DC
Start: 2021-04-16 — End: 2021-04-18
  Administered 2021-04-16: 4 [IU] via SUBCUTANEOUS
  Administered 2021-04-17: 1 [IU] via SUBCUTANEOUS
  Administered 2021-04-17: 5 [IU] via SUBCUTANEOUS
  Administered 2021-04-17: 3 [IU] via SUBCUTANEOUS
  Administered 2021-04-17: 2 [IU] via SUBCUTANEOUS
  Administered 2021-04-17: 3 [IU] via SUBCUTANEOUS
  Administered 2021-04-18: 1 [IU] via SUBCUTANEOUS
  Administered 2021-04-18: 2 [IU] via SUBCUTANEOUS
  Administered 2021-04-18: 3 [IU] via SUBCUTANEOUS
  Filled 2021-04-16: qty 3
  Filled 2021-04-16: qty 12
  Filled 2021-04-16: qty 3
  Filled 2021-04-16: qty 6
  Filled 2021-04-16 (×2): qty 9
  Filled 2021-04-16: qty 6

## 2021-04-16 MED ORDER — MELATONIN 3 MG PO TABS
3.0000 mg | ORAL_TABLET | Freq: Every evening | ORAL | Status: DC | PRN
Start: 2021-04-16 — End: 2021-04-19

## 2021-04-16 MED ORDER — GABAPENTIN 300 MG PO CAPS
300.0000 mg | ORAL_CAPSULE | Freq: Three times a day (TID) | ORAL | Status: DC
Start: 2021-04-16 — End: 2021-04-19
  Administered 2021-04-16 – 2021-04-19 (×9): 300 mg via ORAL
  Filled 2021-04-16 (×9): qty 1

## 2021-04-16 MED ORDER — GLUCAGON 1 MG IJ SOLR (WRAP)
1.0000 mg | INTRAMUSCULAR | Status: DC | PRN
Start: 2021-04-16 — End: 2021-04-19

## 2021-04-16 MED ORDER — ASPIRIN 81 MG PO CHEW
162.0000 mg | CHEWABLE_TABLET | Freq: Once | ORAL | Status: AC
Start: 2021-04-16 — End: 2021-04-16
  Administered 2021-04-16: 162 mg via ORAL
  Filled 2021-04-16: qty 2

## 2021-04-16 MED ORDER — GLUCOSE 40 % PO GEL (WRAP)
15.0000 g | ORAL | Status: DC | PRN
Start: 2021-04-16 — End: 2021-04-16

## 2021-04-16 MED ORDER — INSULIN GLARGINE 100 UNIT/ML SC SOLN
15.0000 [IU] | Freq: Every evening | SUBCUTANEOUS | Status: DC
Start: 2021-04-16 — End: 2021-04-17
  Administered 2021-04-16: 15 [IU] via SUBCUTANEOUS
  Filled 2021-04-16: qty 15

## 2021-04-16 MED ORDER — DEXTROSE 10 % IV BOLUS
12.5000 g | INTRAVENOUS | Status: DC | PRN
Start: 2021-04-16 — End: 2021-04-19

## 2021-04-16 MED ORDER — DILTIAZEM HCL 125 MG/125 ML IN DEXTROSE 5% IV SOLN
2.5000 mg/h | INTRAVENOUS | Status: DC
Start: 2021-04-16 — End: 2021-04-18
  Administered 2021-04-16: 15 mg/h via INTRAVENOUS
  Administered 2021-04-16: 10 mg/h via INTRAVENOUS
  Administered 2021-04-17 – 2021-04-18 (×4): 15 mg/h via INTRAVENOUS
  Filled 2021-04-16 (×5): qty 125

## 2021-04-16 MED ORDER — HEPARIN (PORCINE) IN D5W 50-5 UNIT/ML-% IV SOLN (UNITS/KG/HR ONLY)
12.0000 [IU]/kg/h | INTRAVENOUS | Status: DC
Start: 2021-04-16 — End: 2021-04-18
  Administered 2021-04-17: 12 [IU]/kg/h via INTRAVENOUS
  Administered 2021-04-17: 17 [IU]/kg/h via INTRAVENOUS
  Filled 2021-04-16 (×2): qty 500

## 2021-04-16 MED ORDER — NALOXONE HCL 0.4 MG/ML IJ SOLN (WRAP)
0.2000 mg | INTRAMUSCULAR | Status: DC | PRN
Start: 2021-04-16 — End: 2021-04-19

## 2021-04-16 MED ORDER — MAGNESIUM SULFATE IN D5W 1-5 GM/100ML-% IV SOLN
1.0000 g | INTRAVENOUS | Status: AC
Start: 2021-04-16 — End: 2021-04-16
  Filled 2021-04-16: qty 100

## 2021-04-16 MED ORDER — DILTIAZEM HCL 5 MG/ML IV SOLN (WRAP)
20.0000 mg | Freq: Once | INTRAVENOUS | Status: AC
Start: 2021-04-16 — End: 2021-04-16
  Administered 2021-04-16: 20 mg via INTRAVENOUS
  Filled 2021-04-16: qty 5

## 2021-04-16 MED ORDER — ACETAMINOPHEN 325 MG PO TABS
650.0000 mg | ORAL_TABLET | Freq: Four times a day (QID) | ORAL | Status: DC | PRN
Start: 2021-04-16 — End: 2021-04-19

## 2021-04-16 MED ORDER — PANTOPRAZOLE SODIUM 40 MG PO TBEC
40.0000 mg | DELAYED_RELEASE_TABLET | Freq: Every morning | ORAL | Status: DC
Start: 2021-04-17 — End: 2021-04-19
  Administered 2021-04-17 – 2021-04-19 (×3): 40 mg via ORAL
  Filled 2021-04-16 (×3): qty 1

## 2021-04-16 MED ORDER — DEXTROSE 10 % IV BOLUS
12.5000 g | INTRAVENOUS | Status: DC | PRN
Start: 2021-04-16 — End: 2021-04-16

## 2021-04-16 MED ORDER — GLUCOSE 40 % PO GEL (WRAP)
15.0000 g | ORAL | Status: DC | PRN
Start: 2021-04-16 — End: 2021-04-19

## 2021-04-16 MED ORDER — ENOXAPARIN SODIUM 40 MG/0.4ML IJ SOSY
40.0000 mg | PREFILLED_SYRINGE | Freq: Every day | INTRAMUSCULAR | Status: DC
Start: 2021-04-16 — End: 2021-04-16

## 2021-04-16 MED ORDER — ATORVASTATIN CALCIUM 80 MG PO TABS
80.0000 mg | ORAL_TABLET | Freq: Every evening | ORAL | Status: DC
Start: 2021-04-16 — End: 2021-04-19
  Administered 2021-04-16 – 2021-04-18 (×3): 80 mg via ORAL
  Filled 2021-04-16 (×3): qty 1

## 2021-04-16 MED ORDER — HEPARIN SODIUM (PORCINE) 5000 UNIT/ML IJ SOLN
4000.0000 [IU] | Freq: Once | INTRAMUSCULAR | Status: AC
Start: 2021-04-16 — End: 2021-04-17
  Administered 2021-04-17: 4000 [IU] via INTRAVENOUS
  Filled 2021-04-16: qty 1

## 2021-04-16 MED ORDER — DEXTROSE 50 % IV SOLN
12.5000 g | INTRAVENOUS | Status: DC | PRN
Start: 2021-04-16 — End: 2021-04-16

## 2021-04-16 MED ORDER — DEXTROSE 50 % IV SOLN
12.5000 g | INTRAVENOUS | Status: DC | PRN
Start: 2021-04-16 — End: 2021-04-19

## 2021-04-16 MED ORDER — NICOTINE 7 MG/24HR TD PT24
1.0000 | MEDICATED_PATCH | Freq: Every day | TRANSDERMAL | Status: DC
Start: 2021-04-17 — End: 2021-04-17
  Filled 2021-04-16 (×2): qty 1

## 2021-04-16 MED ORDER — FUROSEMIDE 10 MG/ML IJ SOLN
60.0000 mg | Freq: Once | INTRAMUSCULAR | Status: AC
Start: 2021-04-16 — End: 2021-04-16
  Administered 2021-04-16: 60 mg via INTRAVENOUS
  Filled 2021-04-16: qty 8

## 2021-04-16 MED ORDER — GLUCAGON 1 MG IJ SOLR (WRAP)
1.0000 mg | INTRAMUSCULAR | Status: DC | PRN
Start: 2021-04-16 — End: 2021-04-16

## 2021-04-16 NOTE — Nursing Progress Note (Signed)
Patient was brought to room 453 by ED nurse. Patient on diltiazem drip infusing at a rate of 15 ml/hr (dose 15mg /hr).   ED present at handoff. CTU4 RN and ED RN unable to sign at handoff due to pharmacy still in chart. 3rd RN present at bedside as witness to sign of w/ Glee Arvin.     Patient A &O x4, telemetry leads and monitor on. Gown and non slip socks on. Patient does not complain of any pain. Vital signs taken and weight taken at bedside.

## 2021-04-16 NOTE — ED Provider Notes (Signed)
History     Chief Complaint   Patient presents with    Chest Pain     76 year old male with possible history of hypertension, hyperlipidemia, diabetes, A fib non compliant with meds and has not seen a Physician in 3 years here with moderate constant chest pain that started last night with left shoulder pain.  Patient also reports has had cough, sob PND, orthopnea and LE swelling for months.          Past Medical History:   Diagnosis Date    Hyperlipidemia     Hypertension     Irregular heart beat     Restless leg        Past Surgical History:   Procedure Laterality Date    BACK SURGERY      SPINAL CORD STIMULATOR IMPLANT         No family history on file.    Social  Social History     Tobacco Use    Smoking status: Every Day     Packs/day: 1.00     Types: Cigarettes    Smokeless tobacco: Never   Substance Use Topics    Alcohol use: Yes     Comment: beer occasionally       .     Allergies   Allergen Reactions    Strawberry C [Ascorbate] Hives       Home Medications               Blood Glucose Monitoring Suppl (OneTouch Ultra 2) w/Device Kit     Use to test blood sugar 3 times per day Ok to substitute with brand of choice     Blood Glucose Monitoring Suppl (ReliOn Ultima Glucose System) w/Device Kit     Use to test blood sugar 3 times per day Ok to substitute with brand of choice     glucose blood (ReliOn Ultima Test) test strip     Use to test blood sugar 3 times per day Ok to substitute with brand of choice     glucose blood test strip     Use to test blood sugar 3 times per day  OK to substitute with brand of chocie     Lancets 30G Misc     Use to test blood sugar 3 times per day.     Lancets 33G Misc     Use 3 times a day to test blood sugar     ReliOn Ultra Thin Plus Lancets Misc     Use to test blood sugar 3 times per day Ok to substitute with brand of choice             Review of Systems   Constitutional:  Negative for chills and fever.   HENT:  Negative for congestion and rhinorrhea.    Eyes:  Negative  for pain and discharge.   Respiratory:  Positive for cough and shortness of breath. Negative for chest tightness.    Cardiovascular:  Positive for chest pain and palpitations. Negative for leg swelling.   Gastrointestinal:  Negative for abdominal pain, diarrhea, nausea and vomiting.   Genitourinary:  Negative for dysuria, frequency and hematuria.   Musculoskeletal:  Negative for back pain.   Skin:  Negative for pallor.   Neurological:  Negative for dizziness, syncope, speech difficulty and light-headedness.   Psychiatric/Behavioral:  Negative for behavioral problems.        Physical Exam    BP: 141/88, Heart Rate: (!) 110, Temp: 97.9 F (  36.6 C), Resp Rate: 21, SpO2: 96 %, Weight: 77.1 kg    Physical Exam  Vitals and nursing note reviewed.   Constitutional:       Appearance: He is well-developed.   Cardiovascular:      Rate and Rhythm: Tachycardia present. Rhythm irregular.   Pulmonary:      Effort: Pulmonary effort is normal.      Breath sounds: Normal breath sounds.   Abdominal:      General: Bowel sounds are normal.      Palpations: Abdomen is soft.   Musculoskeletal:         General: Normal range of motion.      Cervical back: Normal range of motion and neck supple.      Right lower leg: Edema present.      Left lower leg: Edema present.   Skin:     General: Skin is warm and dry.   Neurological:      General: No focal deficit present.      Mental Status: He is alert and oriented to person, place, and time.   Psychiatric:         Mood and Affect: Mood normal.         Behavior: Behavior normal.         Thought Content: Thought content normal.         Judgment: Judgment normal.           MDM and ED Course     ED Medication Orders (From admission, onward)      Start Ordered     Status Ordering Provider    04/16/21 1621 04/16/21 1620  dilTIAZem (CARDIZEM) injection 20 mg  Once        Route: Intravenous  Ordered Dose: 20 mg     Ordered WOODS, AMIE H               Medical Decision Making  Amount and/or Complexity of  Data Reviewed  Labs: ordered.    Risk  OTC drugs.  Prescription drug management.  Decision regarding hospitalization.        Differential dx include and considered ACS, PE, chf,myocarditis, perforation, GI bleed. I have ordered IV medication  as well as lab and imaging  to r/o the above differential. Will reassess patient after above study.               Procedures    Clinical Impression & Disposition     Clinical Impression  Final diagnoses:   None        ED Disposition       None             New Prescriptions    No medications on file          Monitor  Narrow irr irr    EKG interpreted by me  A fib @ 140's RBBB  nl interval nl axis nl stt  Compared with prior no change         5:53 PM  Pt HR better  Given poor follow up non compliant will admit  I discussed with Dr.Verderese hospitalist on-call accepts patient to card telemetry inpt      Elesa Hacker, MD  04/16/21 2216

## 2021-04-16 NOTE — ED Triage Notes (Signed)
Pt to ED dropped off by daughter per patient from home with multiple complaints including 5/10 CP, swollen feet and ankles, restless legs, difficulty sleeping and L posterior shoulder pain. Reports CP began yesterday, is intermittent and dull at times. Reports swollen feet and ankles began last night. Reports restless legs while sleeping for years and L shoulder pain since October when lifting a mattress. Reports all these symptoms brought him to ED and has not seen PCP since 2020 due to financial difficulties. Denies palpitations at this time, denies SOB at this time. Reports SOB while attempting to sleep. Reports recently diagnosed with sleep apnea so he is scared to sleep. Patient alert and awake, respirations regular and unlabored, skin pink. HR 140s upon this RN meeting patient. PMH DM, afib.

## 2021-04-16 NOTE — ED Notes (Addendum)
[facility] EMERGENCY DEPT  ED NURSING NOTE FOR THE RECEIVING INPATIENT NURSE   ED NURSE [place]/Bernise Sylvain/Ellen/Lucia   SPECTRALINK [phone]   ED CHARGE RN Vinnie Langton    ADMISSION INFORMATION   Scott [place]. is a 76 y.o. male admitted with an ED diagnosis of:    1. Atrial fibrillation with RVR         Isolation: None   Allergies: Strawberry c [ascorbate]   Holding Orders confirmed? No   Belongings Documented? N/A   Home medications sent to pharmacy confirmed? N/A   NURSING CARE   Patient Comes From:   Mental Status: Home Independent  alert and oriented   ADL: Independent with all ADLs   Ambulation: Unable to assess   Pertinent Information  and Safety Concerns:     [place] Violence Risk Level: Low Pt to ED dropped off by daughter per patient from home with multiple complaints including 5/10 CP, swollen feet and ankles, restless legs, difficulty sleeping and L posterior shoulder pain. Reports CP began yesterday, is intermittent and dull at times. Reports swollen feet and ankles began last night. Reports restless legs while sleeping for years and L shoulder pain since October when lifting a mattress. Reports all these symptoms brought him to ED and has not seen PCP since 2020 due to financial difficulties. Denies palpitations at this time, denies SOB at this time. Reports SOB while attempting to sleep. Reports recently diagnosed with sleep apnea so he is scared to sleep. Patient alert and awake, respirations regular and unlabored, skin pink. HR 140s upon this RN meeting patient. PMH DM, afib.    Given 20mg  dilt in ED, on dilt drip at this time.       CT / NIH   CT Head ordered on this patient?  No   NIH/Dysphagia assessment done prior to admission? No   VITAL SIGNS (at the time of this note)      Vitals:    04/16/21 1737   BP: (!) 151/106   Pulse: (!) 127   Resp:    Temp:    SpO2:      Resp: 21; SPO2: 93% RA

## 2021-04-16 NOTE — H&P (Signed)
ADMISSION HISTORY AND PHYSICAL EXAM    Date Time: 04/16/21 7:08 PM  Patient Name: Scott Monroe,Scott WESLEY JR.  Attending Physician: Twana Firsthandrabhatla, Tejasri, *  Primary Care Physician: Pcp, None, MD    CC: Afib RVR    Assessment:   Scott SoursJohn Wesley Crimi Jr. is a 76 y.o. male with PMHx notable for pAfib, IDDM, GERD, med-nonadherence 2/2 financial barriers who presents to the hospital with BLE edema, chest pain and hyperglycemia. Found to have afib with RVR, now on dilt gtt in the setting of severely uncontrolled DM.    Plan:     # Acute Paroxysmal Atrial Fibrillation with RVR   Hx pAfib, nonadherent to all meds  CHADS-VASc = 3. Symptomatic: Yes  - Anticoagulation: heparin gtt  - Rate Control: Continue Diltiazem gtt  - continuous telemetry  - ECG: afib with RVR + RBBB  - Last TTE 8/22: EF 63%, mildly decreased RV function  - NT-proBNP: 4162  - CXR: Cardiomegaly, no pulm edema  - consider TTE to assess for tachyarrhythmia mediated cardiomyopathy  - K>4, Mag >2  - trial lasix 60mg  IV 1x in ED    #Dull Non-Specific b/l Chest Pain, possibly 2/2 GERD vs ACS vs tachyarrhythmia   - L arm and shoulder pain but has been having for 6 months.  - b/l chest pain present, but improved from the prior sharp pains  - hs-troponin 31  - ECG: afib w/RVR  - d-dimer: 0.68  - DVT Duplex US  - no indication for CTA at this time  - PPI for known GERD  - heparin gtt as above    #Hyponatremia  - likely 2/2 hypervolemia vs SIADH, anticipate improvement with diuresis as above  - daily BMP  - s/p lasix 60mg  1x    #Insulin Dependent Diabetes Mellitus  #Hyperglycemia w/o Anion Gap  Home Regimen: Does not take anything  - Last A1c: 11.7% 8/22  - Repeat A1c  - Insulin regimen in hospital: start with LDSSI, 15u lantus. Treating as insulin naive and reassess in AM    - UA  - gabapentin 300mg  TID    #GERD  - start PPI    #HTN  - Not on any medications at home    #HLD  - Not on any medications at home  - start Lipitor 80mg     #Anxiety/Depression   -  noted    #OSA  - will need CPAP arranged outpatient. Reports his wife took it in divorce in 2020.    #Nicotine Use  - Nicotine patch    #Dispo  - [employer] on discharge  - Significant financial barrier to medication cost. Recommend social work or CM consult.    Diet: Heart Healthy + CC  DVT prophylaxis: Heparin gtt  Code Status: DNR/DNI  Plan discussed with attending physician Dr. Rocky Linkhandrabhatla    Disposition: (Please see PAF column for Expected D/C Date)   Today's date: 04/16/2021  Admit Date: 04/16/2021  4:26 PM  Service status: Inpatient: risk of morbidity and mortality  Clinical Milestones: TBD  Anticipated discharge needs: TBD    History of Presenting Illness:   Scott SoursJohn Wesley Sellers Jr. is a 76 y.o. male with PMHx notable for parosymal atrial fibrillation, HTN, HLD, DM, insomnia who presents to the hospital in the setting of medication non-adherence for chest pain, ankle/foot swelling.    He was brought to ED from home by daughter for 5/10 CP, edematous BLE, insomnia, L posterior shoulder pain. His chest pain began yesterday, is dull and intermittent  with brief sharp stabbing pains.     Also notes posterior head "weirdness." The swelling began last night. His restless legs have been constant for years. L shoulder pain 2/2 lifting a mattress back in October. Denies SOB while awake. Reports recently diagnosed with sleep apnea so he is scared to sleep.    Has not seen PCP since 10/2018 2/2 financial difficulties. No meds since October, 2022.  Also notes bilateral pain "underneath his lungs"     Hospitalized in 09/2020 for afib rvr, notes "taste buds have been shot ever since"    Smokes 1ppd. Started age 49 -> 2014. Then resumed in 2020. 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 "Drank until the bar was empty"    History obtained from patient    No family. Lives with landlord. [name] Pesqueira is not his daughter?    Past Medical History:     Past Medical History:   Diagnosis Date    Hyperlipidemia      Hypertension     Insomnia     Irregular heart beat     Restless leg     Sleep apnea        Available old records reviewed    Past Surgical History:     Past Surgical History:   Procedure Laterality Date    BACK SURGERY      SPINAL CORD STIMULATOR IMPLANT         Family History:   Both sisters died to brain cancer, mom to liver cancer, brother agent orange  Sister MI in 9s.    Social History:     Social History     Tobacco Use   Smoking Status Every Day    Packs/day: 1.00    Types: Cigarettes   Smokeless Tobacco Never   Vaping Use   Vaping Status Never Used     Social History     Substance and Sexual Activity   Alcohol Use Never     Social History     Substance and Sexual Activity   Drug Use Never       Allergies:     Allergies   Allergen Reactions    Strawberry C [Ascorbate] Hives       Medications:     Home Medications       Med List Status: In Progress Set By: Desma Paganini, RN at 04/16/2021  4:51 PM              aspirin 81 MG chewable tablet     Chew 81 mg by mouth daily     Blood Glucose Monitoring Suppl (OneTouch Ultra 2) w/Device Kit     Use to test blood sugar 3 times per day Ok to substitute with brand of choice     Blood Glucose Monitoring Suppl (ReliOn Ultima Glucose System) w/Device Kit     Use to test blood sugar 3 times per day Ok to substitute with brand of choice     glucose blood (ReliOn Ultima Test) test strip     Use to test blood sugar 3 times per day Ok to substitute with brand of choice     glucose blood test strip     Use to test blood sugar 3 times per day  OK to substitute with brand of chocie     Lancets 30G Misc     Use to test blood sugar 3 times per day.     Lancets 33G Misc  Use 3 times a day to test blood sugar     ReliOn Ultra Thin Plus Lancets Misc     Use to test blood sugar 3 times per day Ok to substitute with brand of choice              Method by which medications were confirmed on admission: Patient    Review of Systems:   All other systems were reviewed and are  negative except as documented in HPI    Physical Exam:   Patient Vitals for the past 24 hrs:   BP Temp Temp src Pulse Resp SpO2 Height Weight   04/16/21 1906 146/71 -- -- -- -- -- -- --   04/16/21 1844 -- -- -- (!) 131 -- -- -- --   04/16/21 1830 137/76 -- -- (!) 123 (!) 31 96 % -- --   04/16/21 1811 -- 98.7 F (37.1 C) Temporal -- -- -- -- --   04/16/21 1801 125/60 -- -- (!) 125 (!) 26 96 % -- --   04/16/21 1737 (!) 151/106 -- -- (!) 127 -- -- -- --   04/16/21 1730 (!) 151/106 -- -- (!) 118 21 93 % -- --   04/16/21 1612 141/88 97.9 F (36.6 C) Temporal (!) 168 21 99 % 1.702 m (5\' 7" ) 77.1 kg (170 lb)   04/16/21 1609 -- -- -- (!) 110 -- 96 % -- --     Body mass index is 26.63 kg/m.  No intake or output data in the 24 hours ending 04/16/21 1908    General: Awake, Alert, Oriented x 3; in no acute distress.  HEENT: Pupils equal, round, reactive to light, extra ocular movements intact, sclera anicteric, oropharynx clear without lesions, mucous membranes moist  Neck: Supple, no lymphadenopathy, no thyromegaly, no JVD, no carotid bruits  Cardiovascular: irregularly irregular rate and rhythm, no murmurs, rubs or gallops  Lungs: Clear to auscultation bilaterally, without wheezing, rhonchi, or rales  Abdomen: Soft, non-tender, non-distended; no palpable masses, no hepatosplenomegaly, normoactive bowel sounds, no rebound or guarding  Extremities: trace ankle edema  Neuro: Cranial nerves grossly intact, strength 5/5 in upper and lower extremities, sensation intact  Skin: No rashes or lesions noted        Labs:     Results       Procedure Component Value Units Date/Time    TSH 06/16/21 Collected: 04/16/21 1623    Specimen: Blood Updated: 04/16/21 1824     TSH 0.98 uIU/mL     Magnesium 06/16/21 Collected: 04/16/21 1623    Specimen: Blood Updated: 04/16/21 1809     Magnesium 1.7 mg/dL     NT-proBNP 06/16/21  (Abnormal) Collected: 04/16/21 1623     Updated: 04/16/21 1725     NT-proBNP 4,162 pg/mL     High Sensitivity  Troponin-I at 0 hrs 06/16/21 Collected: 04/16/21 1623    Specimen: Blood Updated: 04/16/21 1723     hs Troponin-I 31.0 ng/L     Comprehensive metabolic panel 06/16/21  (Abnormal) Collected: 04/16/21 1623    Specimen: Blood Updated: 04/16/21 1716     Glucose 449 mg/dL      BUN 06/16/21 mg/dL      Creatinine 1.1 mg/dL      Sodium 24.4 mEq/L      Potassium 4.6 mEq/L      Chloride 98 mEq/L      CO2 19 mEq/L      Calcium 8.9 mg/dL      Protein, Total 6.3 g/dL  Albumin 3.3 g/dL      AST (SGOT) 23 U/L      ALT 26 U/L      Alkaline Phosphatase 154 U/L      Bilirubin, Total 0.8 mg/dL      Globulin 3.0 g/dL      Albumin/Globulin Ratio 1.1     Anion Gap 11.0    GFR [[id number]] Collected: 04/16/21 1623     Updated: 04/16/21 1716     EGFR >60.0       PT/APTT [[id number]] Collected: 04/16/21 1623     Updated: 04/16/21 1713     PT 11.6 sec      PT INR 1.0     PTT 29 sec     CBC and differential [[id number]]  (Abnormal) Collected: 04/16/21 1623    Specimen: Blood Updated: 04/16/21 1656     WBC 8.10 x10 3/uL      Hgb 15.2 g/dL      Hematocrit 13.2 %      Platelets 184 x10 3/uL      RBC 5.77 x10 6/uL      MCV 80.8 fL      MCH 26.3 pg      MCHC 32.6 g/dL      RDW 15 %      MPV 10.5 fL      Instrument Absolute Neutrophil Count 4.54 x10 3/uL      Neutrophils 56.0 %      Lymphocytes Automated 31.0 %      Monocytes 7.4 %      Eosinophils Automated 3.5 %      Basophils Automated 1.6 %      Immature Granulocytes 0.5 %      Nucleated RBC 0.0 /100 WBC      Neutrophils Absolute 4.54 x10 3/uL      Lymphocytes Absolute Automated 2.51 x10 3/uL      Monocytes Absolute Automated 0.60 x10 3/uL      Eosinophils Absolute Automated 0.28 x10 3/uL      Basophils Absolute Automated 0.13 x10 3/uL      Immature Granulocytes Absolute 0.04 x10 3/uL      Absolute NRBC 0.00 x10 3/uL             XR Chest  AP Portable    Result Date: 04/16/2021   Cardiomegaly. No pulmonary edema. Jasmine December D'Heureux, MD 04/16/2021 6:35 PM      Safety Checklist  DVT  prophylaxis:  CHEST guideline (See page [id number]) Chemical   Foley:  [place] Rn Foley protocol Not present   IVs:  Peripheral IV   PT/OT: Ordered   Daily CBC & or Chem ordered:  SHM/ABIM guidelines (see #5) Yes, due to clinical and lab instability   Reference for approximate charges of common labs: CBC auto diff - $76  BMP - $99  Mg - $79    Signed by: Milana Obey, MD   cc:Pcp, None, MD

## 2021-04-17 ENCOUNTER — Inpatient Hospital Stay: Payer: Non-veteran care

## 2021-04-17 DIAGNOSIS — F17210 Nicotine dependence, cigarettes, uncomplicated: Secondary | ICD-10-CM

## 2021-04-17 DIAGNOSIS — E1165 Type 2 diabetes mellitus with hyperglycemia: Secondary | ICD-10-CM

## 2021-04-17 DIAGNOSIS — I10 Essential (primary) hypertension: Secondary | ICD-10-CM

## 2021-04-17 DIAGNOSIS — E785 Hyperlipidemia, unspecified: Secondary | ICD-10-CM

## 2021-04-17 DIAGNOSIS — E119 Type 2 diabetes mellitus without complications: Secondary | ICD-10-CM

## 2021-04-17 DIAGNOSIS — R079 Chest pain, unspecified: Secondary | ICD-10-CM

## 2021-04-17 DIAGNOSIS — M545 Low back pain, unspecified: Secondary | ICD-10-CM

## 2021-04-17 LAB — CBC AND DIFFERENTIAL
Absolute NRBC: 0 10*3/uL (ref 0.00–0.00)
Basophils Absolute Automated: 0.14 10*3/uL — ABNORMAL HIGH (ref 0.00–0.08)
Basophils Automated: 1.7 %
Eosinophils Absolute Automated: 0.4 10*3/uL (ref 0.00–0.44)
Eosinophils Automated: 4.7 %
Hematocrit: 42.6 % (ref 37.6–49.6)
Hgb: 14 g/dL (ref 12.5–17.1)
Immature Granulocytes Absolute: 0.03 10*3/uL (ref 0.00–0.07)
Immature Granulocytes: 0.4 %
Instrument Absolute Neutrophil Count: 4.66 10*3/uL (ref 1.10–6.33)
Lymphocytes Absolute Automated: 2.35 10*3/uL (ref 0.42–3.22)
Lymphocytes Automated: 27.7 %
MCH: 25.8 pg (ref 25.1–33.5)
MCHC: 32.9 g/dL (ref 31.5–35.8)
MCV: 78.5 fL (ref 78.0–96.0)
MPV: 9.9 fL (ref 8.9–12.5)
Monocytes Absolute Automated: 0.89 10*3/uL — ABNORMAL HIGH (ref 0.21–0.85)
Monocytes: 10.5 %
Neutrophils Absolute: 4.66 10*3/uL (ref 1.10–6.33)
Neutrophils: 55 %
Nucleated RBC: 0 /100 WBC (ref 0.0–0.0)
Platelets: 190 10*3/uL (ref 142–346)
RBC: 5.43 10*6/uL (ref 4.20–5.90)
RDW: 15 % (ref 11–15)
WBC: 8.47 10*3/uL (ref 3.10–9.50)

## 2021-04-17 LAB — HEMOLYSIS INDEX
Hemolysis Index: 19 Index (ref 0–24)
Hemolysis Index: 18 Index (ref 0–24)

## 2021-04-17 LAB — GLUCOSE WHOLE BLOOD - POCT
Whole Blood Glucose POCT: 289 mg/dL — ABNORMAL HIGH (ref 70–100)
Whole Blood Glucose POCT: 201 mg/dL — ABNORMAL HIGH (ref 70–100)
Whole Blood Glucose POCT: 188 mg/dL — ABNORMAL HIGH (ref 70–100)
Whole Blood Glucose POCT: 357 mg/dL — ABNORMAL HIGH (ref 70–100)
Whole Blood Glucose POCT: 285 mg/dL — ABNORMAL HIGH (ref 70–100)

## 2021-04-17 LAB — MAGNESIUM
Magnesium: 1.4 mg/dL — ABNORMAL LOW (ref 1.6–2.6)
Magnesium: 2.3 mg/dL (ref 1.6–2.6)

## 2021-04-17 LAB — BASIC METABOLIC PANEL
Anion Gap: 12 (ref 5.0–15.0)
BUN: 13 mg/dL (ref 9.0–28.0)
CO2: 22 mEq/L (ref 17–29)
Calcium: 9.1 mg/dL (ref 7.9–10.2)
Chloride: 96 mEq/L — ABNORMAL LOW (ref 99–111)
Creatinine: 1.2 mg/dL (ref 0.5–1.5)
Glucose: 355 mg/dL — ABNORMAL HIGH (ref 70–100)
Potassium: 3.7 mEq/L (ref 3.5–5.3)
Sodium: 130 mEq/L — ABNORMAL LOW (ref 135–145)

## 2021-04-17 LAB — IRON PROFILE
Iron Saturation: 16 % (ref 15–50)
Iron: 47 ug/dL (ref 40–160)
TIBC: 295 ug/dL (ref 261–462)
UIBC: 248 ug/dL (ref 126–382)

## 2021-04-17 LAB — HEMOGLOBIN A1C: Hemoglobin A1C: >14 % — ABNORMAL HIGH (ref 4.6–5.9)

## 2021-04-17 LAB — HIGH SENSITIVITY TROPONIN-I WITH DELTA
hs Troponin-I Delta: -2.8 ng/L
hs Troponin-I: 30.1 ng/L

## 2021-04-17 LAB — FERRITIN: Ferritin: 51.6 ng/mL (ref 21.80–274.70)

## 2021-04-17 LAB — ANTI-XA,UFH
Anti-Xa, UFH: 0.15 IU/mL
Anti-Xa, UFH: 0.21 IU/mL
Anti-Xa, UFH: 0.19 IU/mL

## 2021-04-17 LAB — GFR: EGFR: 59.3

## 2021-04-17 LAB — IRON: Iron: 33 ug/dL — ABNORMAL LOW (ref 40–160)

## 2021-04-17 MED ORDER — INSULIN LISPRO 100 UNIT/ML SOLN (WRAP)
5.0000 [IU] | Freq: Three times a day (TID) | Status: DC
Start: 2021-04-17 — End: 2021-04-18
  Administered 2021-04-17 – 2021-04-18 (×3): 5 [IU] via SUBCUTANEOUS
  Filled 2021-04-17 (×2): qty 15

## 2021-04-17 MED ORDER — INSULIN NPH (HUMAN) (ISOPHANE) 100 UNIT/ML SC SUSP
5.0000 [IU] | Freq: Every evening | SUBCUTANEOUS | Status: DC
Start: 2021-04-18 — End: 2021-04-17

## 2021-04-17 MED ORDER — NICOTINE 7 MG/24HR TD PT24
1.0000 | MEDICATED_PATCH | Freq: Every day | TRANSDERMAL | Status: DC
Start: 2021-04-17 — End: 2021-04-19
  Administered 2021-04-17 – 2021-04-19 (×3): 1 via TRANSDERMAL
  Filled 2021-04-17 (×4): qty 1

## 2021-04-17 MED ORDER — INSULIN NPH (HUMAN) (ISOPHANE) 100 UNIT/ML SC SUSP
5.0000 [IU] | Freq: Every evening | SUBCUTANEOUS | Status: DC
Start: 2021-04-17 — End: 2021-04-18
  Administered 2021-04-17: 5 [IU] via SUBCUTANEOUS
  Filled 2021-04-17: qty 0.05

## 2021-04-17 MED ORDER — MAGNESIUM SULFATE IN D5W 1-5 GM/100ML-% IV SOLN
1.0000 g | INTRAVENOUS | Status: AC
Start: 2021-04-17 — End: 2021-04-17
  Administered 2021-04-17 (×2): 1 g via INTRAVENOUS
  Filled 2021-04-17: qty 100

## 2021-04-17 MED ORDER — TRAZODONE HCL 50 MG PO TABS
50.0000 mg | ORAL_TABLET | Freq: Every evening | ORAL | Status: DC
Start: 2021-04-17 — End: 2021-04-19
  Administered 2021-04-17 – 2021-04-18 (×2): 50 mg via ORAL
  Filled 2021-04-17 (×2): qty 1

## 2021-04-17 MED ORDER — INSULIN NPH (HUMAN) (ISOPHANE) 100 UNIT/ML SC SUSP
15.0000 [IU] | Freq: Every morning | SUBCUTANEOUS | Status: DC
Start: 2021-04-18 — End: 2021-04-19
  Administered 2021-04-18 – 2021-04-19 (×2): 15 [IU] via SUBCUTANEOUS
  Filled 2021-04-17 (×2): qty 0.15

## 2021-04-17 NOTE — OT Progress Note (Signed)
[facility]   Occupational Therapy Cancellation Note      Patient:  Scott Monroe. MRN#:  [id number]  Unit:  [facility] CTU4 Room/Bed:  FI453/FI453.01    04/17/2021  Time: 2:12 PM        Patient not seen for occupational therapy secondary to patient independent with no OT needs per PT. Will complete OT orders. Please place new orders if functional status changes.    Thank you,  Juanetta Beets, OTR/L  Pager # [phone]

## 2021-04-17 NOTE — Consults (Signed)
[facility] CONSULTATION    Date Time: 04/17/21 11:26 AM  Patient Name: Scott Monroe, [name]  Requesting Physician: Monica Becton, MD  Admission Date: 04/16/2021   Reason for Consultation:   Afib RVR    History:   Scott Washam. is a 76 y.o. male with a history of atrial fibrillation, HTN, HLD, DM2, current smoker, chronic back pain who presented to the hospital on 04/16/2021 with 2 weeks of progressive exertional chest pain and dry cough found to be in afib RVR.     Patient was admitted for afib RVR in 08/2020. Transitioned from dilt gtt converted to po metoprolol and eliquis at discharge. Of note, he has not been able to afford any of this medications since discharge in 08/2020.     He tells me he always has low level exertional chest pain and joint pain. This has been ongoing for years. In the last week his pain has become more intense prompting his ED visit.     In the ED he was found to be in afib RVR with rates in the 140's. BP stable. BNP elevated to 4,000, ddimer elevated to 0.68, hyponatremic at 130. hs-troponin has remained flat at 30. His CXR showed cardiomegaly, no pulmonary edema. U/s negative for DVT. Initiated on dilt and heparin gtts. Rate improved and has been controlled overnight in the 70-80's.     This morning he is feeling better. His chest pain has resolved since his HR has come down. He denies feeling palpitations. Denies SOB, orthopnea or edema.   Past Medical History:   He has a past medical history of Hyperlipidemia, Hypertension, Insomnia, Irregular heart beat, Restless leg, and Sleep apnea. He has a past surgical history that includes Back surgery and Spinal cord stimulator implant.  Medications:   Home Meds:   Medications Prior to Admission   Medication Sig    Blood Glucose Monitoring Suppl (OneTouch Ultra 2) w/Device Kit Use to test blood sugar 3 times per day Ok to substitute with brand of choice    Blood Glucose Monitoring Suppl (ReliOn Ultima Glucose System) w/Device Kit  Use to test blood sugar 3 times per day Ok to substitute with brand of choice    glucose blood (ReliOn Ultima Test) test strip Use to test blood sugar 3 times per day Ok to substitute with brand of choice    glucose blood test strip Use to test blood sugar 3 times per day  OK to substitute with brand of chocie    Lancets 30G Misc Use to test blood sugar 3 times per day.    Lancets 33G Misc Use 3 times a day to test blood sugar    ReliOn Ultra Thin Plus Lancets Misc Use to test blood sugar 3 times per day Ok to substitute with brand of choice    [DISCONTINUED] aspirin 81 MG chewable tablet Chew 81 mg by mouth daily      Scheduled Meds:     atorvastatin, 80 mg, Oral, QHS  gabapentin, 300 mg, Oral, Q8H SCH  insulin glargine, 15 Units, Subcutaneous, QHS  insulin lispro, 1-5 Units, Subcutaneous, Q4H SCH  insulin lispro, 5 Units, Subcutaneous, TID AC  nicotine, 1 patch, Transdermal, Daily  pantoprazole, 40 mg, Oral, QAM AC      Continuous Infusions:   dilTIAZem 15 mg/hr (04/17/21 1044)    heparin infusion 25,000 units/500 mL (Cardiac/Low Intensity) 14 Units/kg/hr (04/17/21 0820)       PRN Meds:  acetaminophen, dextrose **OR** dextrose **OR**  dextrose **OR** glucagon (rDNA), heparin (porcine), melatonin, naloxone    Physical Exam:   Vital Signs: BP 121/74   Pulse 87   Temp 97.3 F (36.3 C) (Oral)   Resp 18   Ht 1.702 m (5\' 7" )   Wt 69.4 kg (152 lb 14.4 oz)   SpO2 93%   BMI 23.95 kg/m      Intake/Output Summary (Last 24 hours) at 04/17/2021 0700  Last data filed at 04/17/2021 0522  Gross per 24 hour   Intake 253.26 ml   Output 1295 ml   Net -1041.74 ml     General: awake, alert, no acute distress.  Cardiovascular: irregular rate and rhythm, no murmurs, rubs or gallops     Lungs: clear to auscultation bilaterally  Abdomen: soft, non-tender, non-distended, normoactive bowel sounds  Extremities: no edema   Cardiographics:   ECG: Afib RVR  Telemetry: Afib 70-90's.   Echocardiogram: 08/2020  Summary    * Left ventricular  systolic function is normal with an ejection fraction by  Biplane Method of Discs of  63 %.    * Mildly decreased right ventricular systolic function.    * No significant valvular dysfunction.    * No prior study is available for comparison.  Labs Reviewed:     Recent Labs     04/17/21  0148 04/16/21  1623   WBC 8.47 8.10   Hgb 14.0 15.2   Hematocrit 42.6 46.6   Platelets 190 184     Recent Labs     04/17/21  0148 04/16/21  2215 04/16/21  1623   hs Troponin-I 30.1 32.9 31.0   hs Troponin-I Delta -2.8  --   --    NT-proBNP  --   --  4,162*    Recent Labs     04/17/21  0734 04/17/21  0148 04/16/21  1623   Sodium  --  130* 128*   Potassium  --  3.7 4.6   CO2  --  22 19   BUN  --  13.0 12.0   Creatinine  --  1.2 1.1   Glucose  --  355* 449*   Magnesium 2.3 1.4* 1.7    Estimated Creatinine Clearance: 51.3 mL/min (based on SCr of 1.2 mg/dL).   Recent Labs     04/16/21  1623   PT INR 1.0   TSH 0.98        Rads:     Radiology Results (24 Hour)       Procedure Component Value Units Date/Time    US Venous Duplex Doppler Leg Bilateral [[id number]] Collected: 04/17/21 0921    Order Status: Completed Updated: 04/17/21 0925    Narrative:      HISTORY: Chest pain.    COMPARISON: None.    TECHNIQUE: Wallace Cullens scale, color flow, and spectral Doppler waveform  analysis was performed on the bilateral lower extremity veins described  below. There is normal compressibility, phasic flow, and response to  augmentation unless otherwise noted.    FINDINGS:   Right lower extremity:    Common femoral vein: Normal  Deep femoral vein (proximal portion): Normal  Greater saphenous vein at the saphenofemoral junction: Normal  Femoral vein: Normal  Popliteal vein: Normal  Posterior tibial veins: Normal  Peroneal veins: Normal    Left lower extremity:    Common femoral vein: Normal  Deep femoral vein (proximal portion): Normal  Greater saphenous vein at the saphenofemoral junction: Normal  Femoral vein: Normal  Popliteal vein: Normal  Posterior  tibial  veins: Normal  Peroneal veins: Normal      Impression:         No sonographic evidence for right or left lower extremity deep venous  thrombosis.    Mills Koller, MD  04/17/2021 9:22 AM    XR Chest  AP Portable [[id number]] Collected: 04/16/21 1833    Order Status: Completed Updated: 04/16/21 1837    Narrative:      HISTORY: Chest pain     COMPARISON: 08/19/2020    FINDINGS: Heart is enlarged. Aorta is calcified and uncoiled. The heart  size and contour are normal.  Lungs are clear with normal pulmonary  vascularity. Interstitial prominence, slightly increased since the prior  study. No pleural effusion, hilar or mediastinal prominence is evident.  Spinal stimulator is seen overlying the mid thoracic spine.      Impression:       Cardiomegaly. No pulmonary edema.    Jasmine December D'Heureux, MD  04/16/2021 6:35 PM          Assessment:   76 y.o. male with a history of atrial fibrillation, HTN, HLD, DM2, current smoker, chronic back pain who presented to the hospital on 04/16/2021 with 2 weeks of progressive exertional chest pain and dry cough found to be in afib RVR.     - Afib RVR, now rate controlled on diltiazem gtt at 15 mg/hr. Continues on heparin gtt. Of note he was admitted in August 2022 and was treated for RVR- d/c home on eliquis and metoprolol, but financial and access barriers to care prevented him from taking his medication.     - Chest pain, resolved, likely in setting of RVR.     - Preserved LVEF on echo 08/2020.   - HTN   - HLD  - DM2  - Chronic back pain  - Current smoker   - Social issue: currently living with his daughter but does not have prescription insurance and does not have reliable transportation. Daughter may be able to help  Plan:   - transition from diltiazem gtt to po diltiazem, can use short acting 30 mg q6h and up titrate; then transition to long acting prior to discharge.   - given financial barriers to care can use coumadin for [facility] as will be more cost effective. He will still  need transportation  in order to establish in INR clinic  - continue home atorvastatin 20mg  q nightly  - DM II management per primary team  - case management, [place]   - final recommendations per attending cardiologist        Signed by: Clearence Ped, NP      I saw and evaluated the patient.  I spoke with the APP and formulated the assessment and plan.  I have edited the note above to reflect my findings and have the following caveats:     His ability to pay for meds is limited by his low income. After paying for his housing, he has only about $200 for other items like food.    Can start transition to Po dilt and monitor overnight  May need warfarin    [facility]   [facility] NP Spectralink [phone] or [phone] (M-F 8 am-5 pm)   [facility] Babinski [facility] Spectralink [phone]  (M-F 8 am-5 pm)  After Hours On-Call Physician: [phone]    cc:Pcp, None, MD

## 2021-04-17 NOTE — Progress Notes (Signed)
Initial Case Management Assessment and Discharge Planning  [facility]   Patient Name: Scott Monroe, Scott Monroe.   Date of Birth December 14, 1945   Attending Physician: Monica Becton, MD   Primary Care Physician: Marisa Sprinkles, MD   Length of Stay 1   Reason for Consult / Chief Complaint See below:        Situation   Admission DX:   1. Atrial fibrillation with RVR      A/O Status: X 3  Patient admitted from: ER  Admission Status: inpatient  Health Care Agent: Self   Background     Advanced directive: <no information>  Code Status: NO CPR - SUPPORT OK   Residence: Other: Pt states he is in transition and staying with ex wife.  Will move in with daughter in 2 days.  Address on file confirmed.   PCP: PCP None, MD  Patient Contact:   [phone] (home)     [phone] (mobile)     Emergency contact:   Extended Emergency Contact Information  Primary Emergency Contact: [facility]  Mobile Phone: [phone]  Relation: Daughter  Preferred language: English      ADL/IADL's: Independent  Previous Level of function: 7 Independent   DME: None  Pharmacy:     [facility] [address] ([address]), [place] - [address]  [address]  [place] [address]  Phone: [phone] Fax: [phone]    [id number] - [place], [place] - [address]  [address] [place] [address]  Phone: [phone] Fax: [phone]    [id number] [place], [place] - [address]  [address] [place] [address]  Phone: [phone] Fax: [phone]    Prescription Coverage: No  Home Health: The patient is not currently receiving home health services.  Previous SNF/AR: None  COVID Vaccine Status: vaccinated  Date First IMM given: see chart  UAI on file?: No  Transport for discharge? Mode of transportation: Sales executive - Family/Friend to drive patient  Agreeable to Home with family post-discharge:  Yes     Assessment   Assessment completed at bedside w/ pt. CM introduced self  and role. Pt states he is currently living with ex wife and her husband. States he plans to move in with his daughter in 2 days. Pt states he depends on his daughter for housing and transportation. Discussed [facility] referral, pt is in agreement to follow up. Pt is independent and denies any DME. CM to continue to follow along with team.     BARRIERS TO DISCHARGE: Pt is uninsured and has difficulty affording medications.      Recommendation   D/C Plan A: Home with family        Carolanne Grumbling, LCSW, CCM  [facility] Management  Social Work Care Manager II  [address]  [place], [place] [address]  Phone: [phone]

## 2021-04-17 NOTE — Consults (Addendum)
ENDOCRINE NEW CONSULT    Date Time: 04/17/21 9:27 AM  Patient Name: Scott Monroe, [name]  Requesting Physician: Monica Becton, MD  Consulting Physician: Lind Guest, MD  Admission Date: 04/16/2021    Primary Care Physician: Marisa Sprinkles, MD    Endocrinology Consultation for: Type 2 diabetes with hyperglycemia  Payor: /     I personally spent 82 minutes today, exclusive of procedures, providing care for this patient, including preparation, face to face time, EMR documentaton and other services such as review of medical record, diagnostic results, patient education, counseling, coordination of care as specified in the encounter.   Assessment/Recommendations:   Scott Monroe. is a 76 y.o. male with past medical history of type 2 diabetes, atrial fibrillation, and hypertension who presented to the hospital on 04/16/2021 with chest pain and shortness of breath, found to have atrial fibrillation with RVR. Endocrinology consulted for management of type 2 diabetes with hyperglycemia.    Uncontrolled type 2 diabetes with hyperglycemia: HbA1c this admission >14%  Patient severely hyperglycemic on admission, in the setting of inability to afford medications and not taking antihyperglycemic agents  Patient with improved glycemic control, has fasting and prandial hyperglycemia  We will switch to NPH in preparation for discharge (less expensive than Lantus)  Discontinue Lantus   Starting tonight, start NPH 5 units qHS (give 50% if NPO)  Starting tomorrow morning, start NPH 15 units daily before breakfast (give 50% of dose if NPO)  Start Lispro 5 units qAC; Hold if not eating/NPO. Reduce dose by 50% if glucose is less than 120 mg/dL  Continue Lispro MDSSI every 4 hours for additional hyperglycemic coverage  Will continue to monitor for hypoglycemia and hyperglycemia and adjust insulin regimen as needed  Patient with HbA1c greater than 14%, so recommend insulin on discharge, along with metformin and glipizide. Given  financial strain, discussed with case management need for insulin coverage on discharge  Please provide referral for transitional care clinic on discharge  BG goal in hospital: 140 to 180 mg/dl  Discharge recommendations as below        Endocrine Recommendations       Home regimen    None       Discharge regimen   Insulin (either NPH and lispro or NPH/regular 70/30 insulin), metformin 1000 mg PO BID and glipizide 5 mg p.o. once daily       Follow up provider   Outpatient endocrinology referral placed, transitional care clinic         I have reviewed the following: blood glucose levels, insulin requirements  I have ordered the following: insulin changes as above    Monica Becton, MD, thank you for this consultation.  We will follow the patient with you during this hospitalization.  Please contact me with any questions or issues.      Lind Guest, MD  [facility] Endocrinology  Epic Chat or Pager [phone]  On nights, holidays and weekends please page the endocrine consult pager [phone]) for emergencies    History of Presenting Illness:   Scott Monroe. is a 76 y.o. male with past medical history of type 2 diabetes, atrial fibrillation, and hypertension who presented to the hospital on 04/16/2021 with chest pain and shortness of breath, found to have atrial fibrillation with RVR. Endocrinology consulted for management of type 2 diabetes with hyperglycemia.    Patient reports history of type 2 diabetes that dates back to over 20 years ago.  He recalls  previously being on oral antihyperglycemic agents, however, has not been on any medications recently due to inability to afford it.  His HbA1c this admission is greater than 14%, increased from 11.7% in 08/2020.    Past Medical History:     Past Medical History:   Diagnosis Date    Hyperlipidemia     Hypertension     Insomnia     Irregular heart beat     Restless leg     Sleep apnea        Past Surgical History:     Past Surgical History:   Procedure Laterality Date    BACK  SURGERY      SPINAL CORD STIMULATOR IMPLANT         Social History:   Patient  reports that he has been smoking cigarettes. He has been smoking an average of 1 pack per day. He has never used smokeless tobacco. He reports that he does not drink alcohol and does not use drugs.    Family History:   Patient reports family history is not on file.     Allergies:     Allergies   Allergen Reactions    Strawberry C [Ascorbate] Hives       Medications:     Medications Prior to Admission   Medication Sig    Blood Glucose Monitoring Suppl (OneTouch Ultra 2) w/Device Kit Use to test blood sugar 3 times per day Ok to substitute with brand of choice    Blood Glucose Monitoring Suppl (ReliOn Ultima Glucose System) w/Device Kit Use to test blood sugar 3 times per day Ok to substitute with brand of choice    glucose blood (ReliOn Ultima Test) test strip Use to test blood sugar 3 times per day Ok to substitute with brand of choice    glucose blood test strip Use to test blood sugar 3 times per day  OK to substitute with brand of chocie    Lancets 30G Misc Use to test blood sugar 3 times per day.    Lancets 33G Misc Use 3 times a day to test blood sugar    ReliOn Ultra Thin Plus Lancets Misc Use to test blood sugar 3 times per day Ok to substitute with brand of choice    [DISCONTINUED] aspirin 81 MG chewable tablet Chew 81 mg by mouth daily       Current Facility-Administered Medications   Medication Dose Route Frequency    atorvastatin  80 mg Oral QHS    gabapentin  300 mg Oral Q8H SCH    insulin glargine  15 Units Subcutaneous QHS    insulin lispro  1-5 Units Subcutaneous Q4H SCH    insulin lispro  5 Units Subcutaneous TID AC    nicotine  1 patch Transdermal Daily    pantoprazole  40 mg Oral QAM AC        Available old records reviewed, including:  EPIC  Review of Systems:   All other systems were reviewed and are negative except as per HPI    Physical Exam:   Temp:  [97 F (36.1 C)-98.7 F (37.1 C)] 97 F (36.1 C)  Heart Rate:   [80-168] 82  Resp Rate:  [18-31] 18  BP: (118-151)/(55-106) 128/67   Wt Readings from Last 3 Encounters:   04/17/21 69.4 kg (152 lb 14.4 oz)   08/21/20 70.3 kg (154 lb 14.4 oz)   10/27/18 71.2 kg (157 lb)     Body mass index is 23.95  kg/m.    Intake/Output Summary (Last 24 hours) at 04/17/2021 1610  Last data filed at 04/17/2021 0522  Gross per 24 hour   Intake 253.26 ml   Output 1295 ml   Net -1041.74 ml       General: pleasant, awake, alert, oriented x3; no acute distress  HEENT: eomi, sclera anicteric   Lungs: normal work of breathing on room air  Abdomen: non-distended  Neuro: A+O x 3, cranial nerves grossly intact, no gross motor deficits  Skin: no rashes or lesions noted on exposed surfaces    Labs:     Whole Blood Glucose POCT   Date/Time Value Ref Range Status   04/17/2021 0800 201 (H) 70 - 100 mg/dL Final   96/04/5407 8119 289 (H) 70 - 100 mg/dL Final   14/78/2956 2130 321 (H) 70 - 100 mg/dL Final   86/57/8469 6295 367 (H) 70 - 100 mg/dL Final   28/41/3244 0102 251 (H) 70 - 100 mg/dL Final     Comment:     RN Notified       Hemoglobin A1C (%)   Date Value   08/20/2020 11.7 (H)       Recent Labs     04/17/21  0148 04/16/21  1623   WBC 8.47 8.10   Hgb 14.0 15.2   Hematocrit 42.6 46.6   Platelets 190 184       Recent Labs     04/17/21  0734 04/17/21  0148 04/16/21  1623   Sodium  --  130* 128*   Potassium  --  3.7 4.6   Chloride  --  96* 98*   CO2  --  22 19   BUN  --  13.0 12.0   Creatinine  --  1.2 1.1   Glucose  --  355* 449*   Calcium  --  9.1 8.9   Magnesium 2.3 1.4* 1.7       Recent Labs     04/16/21  1623   AST (SGOT) 23   ALT 26   Alkaline Phosphatase 154*   Protein, Total 6.3   Albumin 3.3*       Recent Labs     04/16/21  2117 04/16/21  1623   PTT 27 29   PT  --  11.6   PT INR  --  1.0       TSH   Date Value Ref Range Status   04/16/2021 0.98 0.35 - 4.94 uIU/mL Final   08/19/2020 0.54 0.35 - 4.94 uIU/mL Final       Imaging:     Imaging personally reviewed, including: N/A      Signed by: Lind Guest,  MD    cc: Monica Becton, MD  Pcp, None, MD

## 2021-04-17 NOTE — Plan of Care (Signed)
-  Aox4, occasional forgetfulness   -No pain complaints  - Bilateral US completed, negative  - Continue heparin, monitor for bleed, monitor anti-Xa  - pt remained in A. Fib  -continue Diltiazem gtt @15   -endocrine consulted  -ECHO ordered  -Monitor BG    Problem: Moderate/High Fall Risk Score >5  Goal: Patient will remain free of falls  Outcome: Progressing  Flowsheets (Taken 04/17/2021 1842)  Moderate Risk (6-13):   MOD-Consider activation of bed alarm if appropriate   MOD-Apply bed exit alarm if patient is confused   MOD-Floor mat at bedside (where available) if appropriate   MOD-Remain with patient during toileting   MOD-Re-orient confused patients   MOD-Perform dangle, stand, walk (DSW) prior to mobilization

## 2021-04-17 NOTE — UM Notes (Addendum)
04/16/21 1754  Adult Admit to Inpatient ([facility] Only)  Once        Diagnosis: Atrial Fibrillation With Rvr    Level of Care: Acute    Patient Class: Inpatient          Adult Admit to Inpatient ([facility] Only) (Order #[id number]) on 04/16/21    UNIT: Cardiac Telemetry    A 76 year old male with a history of A-fib, hypertension, hyperlipidemia and type 2 diabetes presents with chest pain, orthopnea and 2 weeks of dry cough.  Of note, he has not been able to afford any of this medications since discharge in 08/2020. In August, he was admitted for Afib and hyperglycemia. He is found to be in A-fib RVR, most likely due to medication non-compliance. He is not volume overloaded exam    Past Surgical History:   Procedure Laterality Date    BACK SURGERY        SPINAL CORD STIMULATOR IMPLANT             Home Medications     aspirin 81 MG chewable tablet       Chew 81 mg by mouth daily      Blood Glucose Monitoring Suppl (OneTouch Ultra 2) w/Device Kit       Use to test blood sugar 3 times per day Ok to substitute with brand of choice      Blood Glucose Monitoring Suppl (ReliOn Ultima Glucose System) w/Device Kit       Use to test blood sugar 3 times per day Ok to substitute with brand of choice      glucose blood (ReliOn Ultima Test) test strip       Use to test blood sugar 3 times per day Ok to substitute with brand of choice      glucose blood test strip       Use to test blood sugar 3 times per day  OK to substitute with brand of chocie      Lancets 30G Misc       Use to test blood sugar 3 times per day.      Lancets 33G Misc       Use 3 times a day to test blood sugar      ReliOn Ultra Thin Plus Lancets Misc       Use to test blood sugar 3 times per day Ok to substitute with brand of choice     Patient Vitals for the past 24 hrs:    BP Temp Temp src Pulse Resp SpO2 Height Weight   04/16/21 1906 146/71 -- -- -- -- -- -- --   04/16/21 1844 -- -- -- (!) 131 -- -- -- --   04/16/21 1830 137/76 -- -- (!) 123 (!) 31 96 % -- --   04/16/21  1811 -- 98.7 F (37.1 C) Temporal -- -- -- -- --   04/16/21 1801 125/60 -- -- (!) 125 (!) 26 96 % -- --   04/16/21 1737 (!) 151/106 -- -- (!) 127 -- -- -- --   04/16/21 1730 (!) 151/106 -- -- (!) 118 21 93 % -- --   04/16/21 1612 141/88 97.9 F (36.6 C) Temporal (!) 168 21 99 % 1.702 m (5\' 7" ) 77.1 kg (170 lb)   04/16/21 1609 -- -- -- (!) 110 -- 96 % -- --   Body mass index is 26.63 kg/m.    EKG :   ATRIAL FIBRILLATION WITH RAPID VENTRICULAR RESPONSE   RIGHT  BUNDLE BRANCH BLOCK   T WAVE ABNORMALITY, CONSIDER LATERAL ISCHEMIA   ABNORMAL ECG   WHEN COMPARED WITH ECG OF 19-Aug-2020 13:46,   QRS DURATION HAS INCREASED   MINIMAL CRITERIA FOR INFERIOR MYOCARDIAL INFARCTION ARE NO LONGER PRESENT   T WAVE INVERSION MORE EVIDENT IN Anterolateral leads         CXR-- Cardiomegaly. No pulmonary edema.    US VENOUS DUPLEX DOPPLER BIL LEG--No sonographic evidence for right or left lower extremity deep venous thrombosis.     ABN LABS--NT-proBNP 4,162NA 128  CL 98  ALB 3.3  ALKP 154      PLAN:  - diltiazem gtt   - does not appear volume overloaded on exam, BNP elevation could be secondary to Afib   - start lantus 15 and LDSSI  - start gabapentin for BLE neuropathy   - BLE dopplers, defer CTA  - start lipitor for HLD  - nicotine patch   - will need TCM appointment on [place]      DVT ppx: heparin gtt    ++++++++++++++++++++++++++++++++++++++++++++  CSR for 04/17/2021    VS--P 87  BP 121/74  T 97.3  R 18  SpO2 93% ( RA)    ABN LABS--GLU 355  NA 130  CL 96  Mg 1.4  Iron 33  HgA1C >14.0      PLAN:  Awaiting MD visit. Notes to follow    UTILIZATION REVIEW CONTACT: Name: Kathlene CoteUTHELMA  P. Charmel Pronovost RNC BSN  Clinical Case Manager - Utilization Review   [facility]  [address]  [address], [address] [place], [place] [id number]  NPI: [id number]   Tax ID: [id number]   Phone: [phone]  Fax: [phone]     NOTES TO REVIEWER:    This clinical review is based on/compiled from documentation provided by the treatment  team within the patient's medical record.

## 2021-04-17 NOTE — Plan of Care (Signed)
Pt name/Code: Scott Monroe. (76 y.o. male);NO CPR - SUPPORT OK  Admit Date/Dx: 04/16/2021 Atrial fibrillation with RVR  Weights: Last 3 Weights for the past 72 hrs (Last 3 readings):   Weight   04/17/21 0522 69.4 kg (152 lb 14.4 oz)   04/16/21 1906 69.8 kg (153 lb 14.4 oz)   04/16/21 1612 77.1 kg (170 lb)       Shift comment:    Pt was started on Heparin drip. 2 grams of Mag was replaced. Pt did expressed being a a Nivaan Dicenzo anxious overnight. Covering Resident was notified and made aware, applied nicotine patch. Pt also shared that he was having some trouble sleeping and having some dreams. Offered some sleeping aids but pt declined. Pt was also interested in talking to someone in social services.         Admission Note:    Telebox and pulse ox connected  Vital signs taken  Admission Reqs done  Reviewed patient's belongings.  Pt made aware of POC.  Arm bands applied.  Placed floor mat      4 eyes in 4 hours pressure injury assessment note:      Completed with: Leeandre Nordling & Jessica  Unit: CTU4            Bony Prominences: Check appropriate box; if wound is present enter wound assessment in LDA     Occiput:                 [x] WNL  []  Wound present  Face:                     [x] WNL  []  Wound present  Ears:                      [x] WNL  []  Wound present  Spine:                    [x] WNL  []  Wound present  Shoulders:             [x] WNL  []  Wound present  Elbows:                  [x] WNL  []  Wound present  Sacrum/coccyx:     [x] WNL  []  Wound present  Ischial Tuberosity:  [x] WNL  []  Wound present  Trochanter/Hip:      [x] WNL  []  Wound present  Knees:                   [x] WNL  []  Wound present  Ankles:                   [x] WNL  []  Wound present  Heels:                    [x] WNL  []  Wound present  Other pressure areas:  []  Wound location       Device related: []  Device name:         LDA completed if wound present: yes/no  Consult WOCN if necessary      Plan:   B/L LE doppler study.  Monitor tele and HR.  Monitor Blood  glucose        Care Plan:  Problem: Moderate/High Fall Risk Score >5  Goal: Patient will remain free of falls  Outcome: Progressing

## 2021-04-17 NOTE — UM Notes (Signed)
ADDENDUM TO CSR for 04/17/2021    Scott Monroe.  Male, 76 y.o., 1945-09-27    MEDICINE PROGRESS NOTE     Date Time: 04/17/21 5:37 PM  Patient Name: Scott Monroe, Scott Monroe.  Attending Physician: Monica Becton, MD     Hospital Course:   Larey Dresser Xachary Hambly. is a 76 y.o. male with PMHx notable for pAfib, IDDM, GERD, med-nonadherence 2/2 financial barriers who presents to the hospital with BLE edema, chest pain and hyperglycemia, found to have Afib with RVR, dilt gtt.     On admission, patient still heart rates poorly controlled on diltiazem drip.  Cardiology was consulted for further recommendations.  Endocrinology was consulted given hemoglobin A1c 14% in light of financial restrictions.      Assessment/Plan:   #Paroxysmal Atrial Fibrillation with RVR   CHADS-VASc = 3. Symptomatic: Yes  Continue diltiazem drip  EKG when rates controlled  Appears patient was previously on Metoprolol XL 100mg  BID  Would favor transition to Metoprolol 25mg  BID and titrate up for rate control  F/u Echo  Continue Heparin gtt  Will transition to Eliquis BID once we can ensure patient can afford  Appreciate CM help  No volume overload on exam, not on O2  Hold further diuresis  Cardiology consulted, appreciated recs     #Chest pain  - Likely 2/2 Afib with RVR  - Troponin negative  - Continue GERD     #Hyponatremia  - Likely 2/2 hypervolemia vs SIADH, anticipate improvement with diuresis as above  - Stable  - daily BMP  - s/p lasix 60mg  1x in ED     #Insulin Dependent Diabetes Mellitus  #Hyperglycemia w/o Anion Gap  - Last A1c: 11.7% 8/22  - Repeat A1c 14%  - Insulin regimen in hospital: Continue Lantus 15u, SSI  - Appreciate Endocrinology consult for adjustment given financial restrictions  - Looks like was previously supposed to be on Glipizide 5mg  BID based on 8/22 admission  - Continue gabapentin 300mg  TID     #GERD  - Continue  PPI     #HTN  - Monitor, consider initiation of ACE/ARB     #HLD  - Previously on Atorvastatin 20mg , has not  taken anything since 8/22 admission  - Continue Lipitor 80mg      #Anxiety/Depression   - noted     #OSA  - will need CPAP arranged outpatient. Reports his wife took it in divorce in 2020.     #Nicotine Use  - Continue Nicotine patch     #Dispo  - [facility] on discharge  - Significant financial barrier to medication cost. Recommend social work or CM consult.     Case discussed with: Pt, RN     Safety Checklist:      DVT prophylaxis:  CHEST guideline (See page [id number]) Chemical and / or mechanical ppx NOT indicated or contraindicated: Fully anticoagulated    Foley:  [place] Rn Foley protocol Not present   IVs:  Peripheral IV   PT/OT: Ordered   Daily CBC & or Chem ordered:  SHM/ABIM guidelines (see #5) Yes, due to clinical and lab instability   Reference for approximate charges of common labs: CBC auto diff - $76  BMP - $99  Mg - $79     Lines:      Patient Lines/Drains/Airways Status         Active PICC Line / CVC Line / PIV Line / Drain / Airway / Intraosseous Line / Epidural Line / ART  Line / Line / Wound / Pressure Ulcer / NG/OG Tube         Name Placement date Placement time Site Days     Peripheral IV 04/16/21 18 G Standard Right Antecubital 04/16/21  1645  Antecubital  1     Peripheral IV 04/16/21 20 G Standard Anterior;Left Forearm 04/16/21  1744  Forearm  less than 1     Peripheral IV 04/17/21 22 G Posterior;Right Forearm 04/17/21  0152  Forearm  less than 1                          Disposition: (Please see PAF column for Expected D/C Date)   Today's date: 04/17/2021  Admit Date: 04/16/2021  4:26 PM  LOS: 1  Clinical Milestones: Pending AFib and hyperglycemia improvement  Anticipated discharge needs: Pending        Subjective      CC: Atrial fibrillation with RVR     HPI/Subjective/Interval: No acute events overnight.  Patient remains in fast heart rates.  Currently asymptomatic.  Feels well otherwise.  Denies any shortness of breath or abdominal pain.  No nausea or vomiting.     Review of Systems:      As per  HPI     Physical Exam:      VITAL SIGNS PHYSICAL EXAM   Temp:  [97 F (36.1 C)-98.7 F (37.1 C)] 97.6 F (36.4 C)  Heart Rate:  [73-131] 82  Resp Rate:  [18-31] 18  BP: (108-150)/(55-76) 109/61  Blood Glucose:     Telemetry: Reviewed        Intake/Output Summary (Last 24 hours) at 04/17/2021 1737  Last data filed at 04/17/2021 1610      Gross per 24 hour   Intake 253.26 ml   Output 1495 ml   Net -1241.74 ml     Physical Exam  General: awake, alert and oriented x 3  Cardiovascular: irregularly irregular o murmurs, rubs or gallops  Lungs: clear to auscultation bilaterally, without wheezing, rhonchi, or rales  Abdomen: soft, non-tender, non-distended; no palpable masses,  normoactive bowel sounds  Extremities: no edema         Meds:      Medications were reviewed:         Current Facility-Administered Medications   Medication Dose Route Frequency    atorvastatin  80 mg Oral QHS    gabapentin  300 mg Oral Q8H SCH    insulin glargine  15 Units Subcutaneous QHS    insulin lispro  1-5 Units Subcutaneous Q4H SCH    insulin lispro  5 Units Subcutaneous TID AC    nicotine  1 patch Transdermal Daily    pantoprazole  40 mg Oral QAM AC      Infusion Meds           Current Facility-Administered Medications   Medication Dose Route Frequency Last Rate    dilTIAZem  2.5-15 mg/hr Intravenous Continuous 15 mg/hr (04/17/21 1044)    heparin infusion 25,000 units/500 mL (Cardiac/Low Intensity)  12 Units/kg/hr Intravenous Continuous 15 Units/kg/hr (04/17/21 1312)         PRN Medications         Current Facility-Administered Medications   Medication Dose Route    acetaminophen  650 mg Oral    dextrose  15 g of glucose Oral     Or    dextrose  12.5 g Intravenous     Or    dextrose  12.5 g Intravenous     Or    glucagon (rDNA)  1 mg Intramuscular    heparin (porcine)  3,000 Units Intravenous    melatonin  3 mg Oral    naloxone  0.2 mg Intravenous               Labs:      Labs (last 72 hours):          Recent Labs   Lab 04/17/21  0148  04/16/21  1623   WBC 8.47 8.10   Hgb 14.0 15.2   Hematocrit 42.6 46.6   Platelets 190 184              Recent Labs   Lab 04/16/21  2117 04/16/21  1623   PT  --  11.6   PT INR  --  1.0   PTT 27 29          Recent Labs   Lab 04/17/21  0148 04/16/21  1623   Sodium 130* 128*   Potassium 3.7 4.6   Chloride 96* 98*   CO2 22 19   BUN 13.0 12.0   Creatinine 1.2 1.1   Calcium 9.1 8.9   Albumin  --  3.3*   Protein, Total  --  6.3   Bilirubin, Total  --  0.8   Alkaline Phosphatase  --  154*   ALT  --  26   AST (SGOT)  --  23   Glucose 355* 449*                         Microbiology, reviewed and are significant for:  Microbiology Results (last 15 days)         Procedure Component Value Units Date/Time     COVID-19 (SARS-CoV-2) and Influenza A/B, NAA (Liat Rapid)- Admission [[id number]] Collected: 04/16/21 2306     Order Status: Completed Specimen: Culturette from Nasopharyngeal Updated: 04/16/21 2350       Purpose of COVID testing Diagnostic -PUI       SARS-CoV-2 Specimen Source Nasal Swab       SARS CoV 2 Overall Result Not Detected       Comment: __________________________________________________  -A result of "Detected" indicates POSITIVE for the    presence of SARS CoV-2 RNA  -A result of "Not Detected" indicates NEGATIVE for the    presence of SARS CoV-2 RNA  __________________________________________________________  Test performed using the Roche cobas Liat SARS-CoV-2 assay. This assay is  only for use under the Food and Drug Administration's Emergency Use  Authorization. This is a real-time RT-PCR assay for the qualitative  detection of SARS-CoV-2 RNA. Viral nucleic acids may persist in vivo,  independent of viability. Detection of viral nucleic acid does not imply the  presence of infectious virus, or that virus nucleic acid is the cause of  clinical symptoms. Negative results do not preclude SARS-CoV-2 infection and  should not be used as the sole basis for diagnosis, treatment or other  patient management decisions.  Negative results must be combined with  clinical observations, patient history, and/or epidemiological information.  Invalid results may be due to inhibiting substances in the specimen and  recollection should occur. Please see Fact Sheets for patients and providers  located:  WirelessDSLBlog.no             Influenza A Not Detected       Influenza B Not Detected       Comment: Test  performed using the Roche cobas Liat SARS-CoV-2 & Influenza A/B assay.  This assay is only for use under the Food and Drug Administration's  Emergency Use Authorization. This is a multiplex real-time RT-PCR assay  intended for the simultaneous in vitro qualitative detection and  differentiation of SARS-CoV-2, influenza A, and influenza B virus RNA. Viral  nucleic acids may persist in vivo, independent of viability. Detection of  viral nucleic acid does not imply the presence of infectious virus, or that  virus nucleic acid is the cause of clinical symptoms. Negative results do  not preclude SARS-CoV-2, influenza A, and/or influenza B infection and  should not be used as the sole basis for diagnosis, treatment or other  patient management decisions. Negative results must be combined with  clinical observations, patient history, and/or epidemiological information.  Invalid results may be due to inhibiting substances in the specimen and  recollection should occur. Please see Fact Sheets for patients and providers  located: http://www.rice.biz/.           Narrative:       o Collect and clearly label specimen type:  o PREFERRED-Upper respiratory specimen: One Nasal Swab in  Transport Media.  o Hand deliver to laboratory ASAP  Diagnostic -PUI  Rescheduled by [id number] at 04/16/2021 23:05 Reason: I clicked the wrong   button, meant to hit no      Imaging, reviewed and are significant for:  Telemetry 04/17/21    Signed by: Monica Becton, MD    +++++++++++++++++++++++++++++++++++++++++++  ENDOCRINE NEW  CONSULT     Date Time: 04/17/21 9:27 AM  Patient Name: Scott Monroe.  Requesting Physician: Monica Becton, MD  Consulting Physician: Lind Guest, MD  Admission Date: 04/16/2021     Primary Care Physician: Marisa Sprinkles, MD     Endocrinology Consultation for: Type 2 diabetes with hyperglycemia  Payor: /      I personally spent 82 minutes today, exclusive of procedures, providing care for this patient, including preparation, face to face time, EMR documentaton and other services such as review of medical record, diagnostic results, patient education, counseling, coordination of care as specified in the encounter.   Assessment/Recommendations:   Mr. Ryin Ambrosius. is a 76 y.o. male with past medical history of type 2 diabetes, atrial fibrillation, and hypertension who presented to the hospital on 04/16/2021 with chest pain and shortness of breath, found to have atrial fibrillation with RVR. Endocrinology consulted for management of type 2 diabetes with hyperglycemia.     Uncontrolled type 2 diabetes with hyperglycemia: HbA1c this admission >14%  Patient severely hyperglycemic on admission, in the setting of inability to afford medications and not taking antihyperglycemic agents  Patient with improved glycemic control, has fasting and prandial hyperglycemia  We will switch to NPH in preparation for discharge (less expensive than Lantus)  Discontinue Lantus   Starting tonight, start NPH 5 units qHS (give 50% if NPO)  Starting tomorrow morning, start NPH 15 units daily before breakfast (give 50% of dose if NPO)  Start Lispro 5 units qAC; Hold if not eating/NPO. Reduce dose by 50% if glucose is less than 120 mg/dL  Continue Lispro MDSSI every 4 hours for additional hyperglycemic coverage  Will continue to monitor for hypoglycemia and hyperglycemia and adjust insulin regimen as needed  Patient with HbA1c greater than 14%, so recommend insulin on discharge, along with metformin and glipizide. Given financial strain,  discussed with case management need for insulin coverage on discharge  Please provide  referral for transitional care clinic on discharge  BG goal in hospital: 140 to 180 mg/dl  Discharge recommendations as below               Endocrine Recommendations         Home regimen     None         Discharge regimen    Insulin (either NPH and lispro or NPH/regular 70/30 insulin), metformin 1000 mg PO BID and glipizide 5 mg p.o. once daily         Follow up provider    Outpatient endocrinology referral placed, transitional care clinic            I have reviewed the following: blood glucose levels, insulin requirements  I have ordered the following: insulin changes as above     Monica Becton, MD, thank you for this consultation.  We will follow the patient with you during this hospitalization.  Please contact me with any questions or issues.        Lind Guest, MD  [facility]  Epic Chat or Pager [phone]  On nights, holidays and weekends please page the endocrine consult pager [phone]) for emergencies     History of Presenting Illness:   [name]. is a 76 y.o. male with past medical history of type 2 diabetes, atrial fibrillation, and hypertension who presented to the hospital on 04/16/2021 with chest pain and shortness of breath, found to have atrial fibrillation with RVR. Endocrinology consulted for management of type 2 diabetes with hyperglycemia.     Patient reports history of type 2 diabetes that dates back to over 20 years ago.  He recalls previously being on oral antihyperglycemic agents, however, has not been on any medications recently due to inability to afford it.  His HbA1c this admission is greater than 14%, increased from 11.7% in 08/2020.     Past Medical History:      Past Medical History        Past Medical History:   Diagnosis Date    Hyperlipidemia      Hypertension      Insomnia      Irregular heart beat      Restless leg      Sleep apnea              Past Surgical History:      Past  Surgical History         Past Surgical History:   Procedure Laterality Date    BACK SURGERY        SPINAL CORD STIMULATOR IMPLANT                Social History:   Patient  reports that he has been smoking cigarettes. He has been smoking an average of 1 pack per day. He has never used smokeless tobacco. He reports that he does not drink alcohol and does not use drugs.     Family History:   Patient reports family history is not on file.      Allergies:           Allergies   Allergen Reactions    Strawberry C [Ascorbate] Hives         Medications:           Medications Prior to Admission   Medication Sig    Blood Glucose Monitoring Suppl (OneTouch Ultra 2) w/Device Kit Use to test blood sugar 3 times per day Ok to  substitute with brand of choice    Blood Glucose Monitoring Suppl (ReliOn Ultima Glucose System) w/Device Kit Use to test blood sugar 3 times per day Ok to substitute with brand of choice    glucose blood (ReliOn Ultima Test) test strip Use to test blood sugar 3 times per day Ok to substitute with brand of choice    glucose blood test strip Use to test blood sugar 3 times per day  OK to substitute with brand of chocie    Lancets 30G Misc Use to test blood sugar 3 times per day.    Lancets 33G Misc Use 3 times a day to test blood sugar    ReliOn Ultra Thin Plus Lancets Misc Use to test blood sugar 3 times per day Ok to substitute with brand of choice    [DISCONTINUED] aspirin 81 MG chewable tablet Chew 81 mg by mouth daily                Current Facility-Administered Medications   Medication Dose Route Frequency    atorvastatin  80 mg Oral QHS    gabapentin  300 mg Oral Q8H SCH    insulin glargine  15 Units Subcutaneous QHS    insulin lispro  1-5 Units Subcutaneous Q4H SCH    insulin lispro  5 Units Subcutaneous TID AC    nicotine  1 patch Transdermal Daily    pantoprazole  40 mg Oral QAM AC         Available old records reviewed, including:  EPIC  Review of Systems:   All other systems were reviewed and are  negative except as per HPI     Physical Exam:   Temp:  [97 F (36.1 C)-98.7 F (37.1 C)] 97 F (36.1 C)  Heart Rate:  [80-168] 82  Resp Rate:  [18-31] 18  BP: (118-151)/(55-106) 128/67       Wt Readings from Last 3 Encounters:   04/17/21 69.4 kg (152 lb 14.4 oz)   08/21/20 70.3 kg (154 lb 14.4 oz)   10/27/18 71.2 kg (157 lb)      Body mass index is 23.95 kg/m.     Intake/Output Summary (Last 24 hours) at 04/17/2021 7106  Last data filed at 04/17/2021 0522      Gross per 24 hour   Intake 253.26 ml   Output 1295 ml   Net -1041.74 ml         General: pleasant, awake, alert, oriented x3; no acute distress  HEENT: eomi, sclera anicteric   Lungs: normal work of breathing on room air  Abdomen: non-distended  Neuro: A+O x 3, cranial nerves grossly intact, no gross motor deficits  Skin: no rashes or lesions noted on exposed surfaces     Labs:              Whole Blood Glucose POCT   Date/Time Value Ref Range Status   04/17/2021 0800 201 (H) 70 - 100 mg/dL Final   26/94/8546 2703 289 (H) 70 - 100 mg/dL Final   50/09/3816 2993 321 (H) 70 - 100 mg/dL Final   71/69/6789 3810 367 (H) 70 - 100 mg/dL Final   17/51/0258 5277 251 (H) 70 - 100 mg/dL Final       Comment:       RN Notified             Hemoglobin A1C (%)   Date Value   08/20/2020 11.7 (H)  Recent Labs     04/17/21  0148 04/16/21  1623   WBC 8.47 8.10   Hgb 14.0 15.2   Hematocrit 42.6 46.6   Platelets 190 184               Recent Labs     04/17/21  0734 04/17/21  0148 04/16/21  1623   Sodium  --  130* 128*   Potassium  --  3.7 4.6   Chloride  --  96* 98*   CO2  --  22 19   BUN  --  13.0 12.0   Creatinine  --  1.2 1.1   Glucose  --  355* 449*   Calcium  --  9.1 8.9   Magnesium 2.3 1.4* 1.7             Recent Labs     04/16/21  1623   AST (SGOT) 23   ALT 26   Alkaline Phosphatase 154*   Protein, Total 6.3   Albumin 3.3*              Recent Labs     04/16/21  2117 04/16/21  1623   PTT 27 29   PT  --  11.6   PT INR  --  1.0               TSH   Date Value Ref Range  Status   04/16/2021 0.98 0.35 - 4.94 uIU/mL Final   08/19/2020 0.54 0.35 - 4.94 uIU/mL Final         Imaging:      Imaging personally reviewed, including: N/A     Signed by: Lind Guest, MD     cc: Monica Becton, MD  Pcp, None, MD

## 2021-04-17 NOTE — Progress Notes (Signed)
MEDICINE PROGRESS NOTE    Date Time: 04/17/21 5:37 PM  Patient Name: Scott Monroe, Scott Monroe.  Attending Physician: Monica Becton, MD    Hospital Course:   Scott Monroe. is a 76 y.o. male with PMHx notable for pAfib, IDDM, GERD, med-nonadherence 2/2 financial barriers who presents to the hospital with BLE edema, chest pain and hyperglycemia, found to have Afib with RVR, dilt gtt.    On admission, patient still heart rates poorly controlled on diltiazem drip.  Cardiology was consulted for further recommendations.  Endocrinology was consulted given hemoglobin A1c 14% in light of financial restrictions.     Assessment/Plan:   #Paroxysmal Atrial Fibrillation with RVR   CHADS-VASc = 3. Symptomatic: Yes  Continue diltiazem drip  EKG when rates controlled  Appears patient was previously on Metoprolol XL 100mg  BID  Would favor transition to Metoprolol 25mg  BID and titrate up for rate control  F/u Echo  Continue Heparin gtt  Will transition to Eliquis BID once we can ensure patient can afford  Appreciate CM help  No volume overload on exam, not on O2  Hold further diuresis  Cardiology consulted, appreciated recs     #Chest pain  - Likely 2/2 Afib with RVR  - Troponin negative  - Continue GERD     #Hyponatremia  - Likely 2/2 hypervolemia vs SIADH, anticipate improvement with diuresis as above  - Stable  - daily BMP  - s/p lasix 60mg  1x in ED     #Insulin Dependent Diabetes Mellitus  #Hyperglycemia w/o Anion Gap  - Last A1c: 11.7% 8/22  - Repeat A1c 14%  - Insulin regimen in hospital: Continue Lantus 15u, SSI  - Appreciate Endocrinology consult for adjustment given financial restrictions  - Looks like was previously supposed to be on Glipizide 5mg  BID based on 8/22 admission  - Continue gabapentin 300mg  TID     #GERD  - Continue  PPI     #HTN  - Monitor, consider initiation of ACE/ARB     #HLD  - Previously on Atorvastatin 20mg , has not taken anything since 8/22 admission  - Continue Lipitor 80mg       #Anxiety/Depression   - noted     #OSA  - will need CPAP arranged outpatient. Reports his wife took it in divorce in 2020.     #Nicotine Use  - Continue Nicotine patch     #Dispo  - [facility] on discharge  - Significant financial barrier to medication cost. Recommend social work or CM consult.    Case discussed with: Pt, RN    Safety Checklist:     DVT prophylaxis:  CHEST guideline (See page [id number]) Chemical and / or mechanical ppx NOT indicated or contraindicated: Fully anticoagulated    Foley:  [place] Rn Foley protocol Not present   IVs:  Peripheral IV   PT/OT: Ordered   Daily CBC & or Chem ordered:  SHM/ABIM guidelines (see #5) Yes, due to clinical and lab instability   Reference for approximate charges of common labs: CBC auto diff - $76  BMP - $99  Mg - $79    Lines:     Patient Lines/Drains/Airways Status       Active PICC Line / CVC Line / PIV Line / Drain / Airway / Intraosseous Line / Epidural Line / ART Line / Line / Wound / Pressure Ulcer / NG/OG Tube       Name Placement date Placement time Site Days    Peripheral IV 04/16/21 18  G Standard Right Antecubital 04/16/21  1645  Antecubital  1    Peripheral IV 04/16/21 20 G Standard Anterior;Left Forearm 04/16/21  1744  Forearm  less than 1    Peripheral IV 04/17/21 22 G Posterior;Right Forearm 04/17/21  0152  Forearm  less than 1                     Disposition: (Please see PAF column for Expected D/C Date)   Today's date: 04/17/2021  Admit Date: 04/16/2021  4:26 PM  LOS: 1  Clinical Milestones: Pending AFib and hyperglycemia improvement  Anticipated discharge needs: Pending      Subjective     CC: Atrial fibrillation with RVR    HPI/Subjective/Interval: No acute events overnight.  Patient remains in fast heart rates.  Currently asymptomatic.  Feels well otherwise.  Denies any shortness of breath or abdominal pain.  No nausea or vomiting.    Review of Systems:     As per HPI    Physical Exam:     VITAL SIGNS PHYSICAL EXAM   Temp:  [97 F (36.1 C)-98.7 F  (37.1 C)] 97.6 F (36.4 C)  Heart Rate:  [73-131] 82  Resp Rate:  [18-31] 18  BP: (108-150)/(55-76) 109/61  Blood Glucose:    Telemetry: Reviewed      Intake/Output Summary (Last 24 hours) at 04/17/2021 1737  Last data filed at 04/17/2021 1610  Gross per 24 hour   Intake 253.26 ml   Output 1495 ml   Net -1241.74 ml    Physical Exam  General: awake, alert and oriented x 3  Cardiovascular: irregularly irregular o murmurs, rubs or gallops  Lungs: clear to auscultation bilaterally, without wheezing, rhonchi, or rales  Abdomen: soft, non-tender, non-distended; no palpable masses,  normoactive bowel sounds  Extremities: no edema       Meds:     Medications were reviewed:  Current Facility-Administered Medications   Medication Dose Route Frequency    atorvastatin  80 mg Oral QHS    gabapentin  300 mg Oral Q8H SCH    insulin glargine  15 Units Subcutaneous QHS    insulin lispro  1-5 Units Subcutaneous Q4H SCH    insulin lispro  5 Units Subcutaneous TID AC    nicotine  1 patch Transdermal Daily    pantoprazole  40 mg Oral QAM AC     Current Facility-Administered Medications   Medication Dose Route Frequency Last Rate    dilTIAZem  2.5-15 mg/hr Intravenous Continuous 15 mg/hr (04/17/21 1044)    heparin infusion 25,000 units/500 mL (Cardiac/Low Intensity)  12 Units/kg/hr Intravenous Continuous 15 Units/kg/hr (04/17/21 1312)     Current Facility-Administered Medications   Medication Dose Route    acetaminophen  650 mg Oral    dextrose  15 g of glucose Oral    Or    dextrose  12.5 g Intravenous    Or    dextrose  12.5 g Intravenous    Or    glucagon (rDNA)  1 mg Intramuscular    heparin (porcine)  3,000 Units Intravenous    melatonin  3 mg Oral    naloxone  0.2 mg Intravenous         Labs:     Labs (last 72 hours):    Recent Labs   Lab 04/17/21  0148 04/16/21  1623   WBC 8.47 8.10   Hgb 14.0 15.2   Hematocrit 42.6 46.6   Platelets 190 184  Recent Labs   Lab 04/16/21  2117 04/16/21  1623   PT  --  11.6   PT INR  --  1.0   PTT  27 29    Recent Labs   Lab 04/17/21  0148 04/16/21  1623   Sodium 130* 128*   Potassium 3.7 4.6   Chloride 96* 98*   CO2 22 19   BUN 13.0 12.0   Creatinine 1.2 1.1   Calcium 9.1 8.9   Albumin  --  3.3*   Protein, Total  --  6.3   Bilirubin, Total  --  0.8   Alkaline Phosphatase  --  154*   ALT  --  26   AST (SGOT)  --  23   Glucose 355* 449*                   Microbiology, reviewed and are significant for:  Microbiology Results (last 15 days)       Procedure Component Value Units Date/Time    COVID-19 (SARS-CoV-2) and Influenza A/B, NAA (Liat Rapid)- Admission [[id number]] Collected: 04/16/21 2306    Order Status: Completed Specimen: Culturette from Nasopharyngeal Updated: 04/16/21 2350     Purpose of COVID testing Diagnostic -PUI     SARS-CoV-2 Specimen Source Nasal Swab     SARS CoV 2 Overall Result Not Detected     Comment: __________________________________________________  -A result of "Detected" indicates POSITIVE for the    presence of SARS CoV-2 RNA  -A result of "Not Detected" indicates NEGATIVE for the    presence of SARS CoV-2 RNA  __________________________________________________________  Test performed using the Roche cobas Liat SARS-CoV-2 assay. This assay is  only for use under the Food and Drug Administration's Emergency Use  Authorization. This is a real-time RT-PCR assay for the qualitative  detection of SARS-CoV-2 RNA. Viral nucleic acids may persist in vivo,  independent of viability. Detection of viral nucleic acid does not imply the  presence of infectious virus, or that virus nucleic acid is the cause of  clinical symptoms. Negative results do not preclude SARS-CoV-2 infection and  should not be used as the sole basis for diagnosis, treatment or other  patient management decisions. Negative results must be combined with  clinical observations, patient history, and/or epidemiological information.  Invalid results may be due to inhibiting substances in the specimen and  recollection should  occur. Please see Fact Sheets for patients and providers  located:  WirelessDSLBlog.no          Influenza A Not Detected     Influenza B Not Detected     Comment: Test performed using the Roche cobas Liat SARS-CoV-2 & Influenza A/B assay.  This assay is only for use under the Food and Drug Administration's  Emergency Use Authorization. This is a multiplex real-time RT-PCR assay  intended for the simultaneous in vitro qualitative detection and  differentiation of SARS-CoV-2, influenza A, and influenza B virus RNA. Viral  nucleic acids may persist in vivo, independent of viability. Detection of  viral nucleic acid does not imply the presence of infectious virus, or that  virus nucleic acid is the cause of clinical symptoms. Negative results do  not preclude SARS-CoV-2, influenza A, and/or influenza B infection and  should not be used as the sole basis for diagnosis, treatment or other  patient management decisions. Negative results must be combined with  clinical observations, patient history, and/or epidemiological information.  Invalid results may be due to inhibiting substances in the  specimen and  recollection should occur. Please see Fact Sheets for patients and providers  located: http://www.rice.biz/[email or web address].org/covid19/test-fact-sheets.         Narrative:      o Collect and clearly label specimen type:  o PREFERRED-Upper respiratory specimen: One Nasal Swab in  Transport Media.  o Hand deliver to laboratory ASAP  Diagnostic -PUI  Rescheduled by [id number] at 04/16/2021 23:05 Reason: I clicked the wrong   button, meant to hit no            Imaging, reviewed and are significant for:  Telemetry 04/17/21      Signed by: Monica BectonAlex M Sasuke Yaffe, MD

## 2021-04-17 NOTE — PT Progress Note (Signed)
[facility]   Physical Therapy Evaluation/Discharge     Patient: Scott Monroe.    MRN#: [id number]   Unit: [facility] CTU4  Bed: FI453/FI453.01    Discharge Recommendations:   Discharge Recommendation: Home with no needs       DME Recommendation: none     Assessment:   Scott Rabalais. is a 76 y.o. male admitted 04/16/2021.  Patient presents close/at functional baseline for functional mobility and ambulation.  Prior to admission, patient was independent. Patient completes bed mobility, transfers and gait with modified independence.  Dynamic balance with good with ability to make short quick turns without LOB. Denies additional PT needs at this time.  Please re-consult if change in patient's functional status.  D/C PT.      No acute PT needs identified. Will SO and remain available should need arise.     Therapy Diagnosis: Eval for impaired functional mobility    Rehabilitation Potential: N/A    Treatment Activities: PT evaluation, bed mob, transfers, gait  Educated the patient to role of physical therapy, plan of care, goals of therapy and safety with mobility and ADLs.    Plan:   D/C acute PT services    Treatment/Interventions: Eval only    Risks/benefits/POC discussed with patient.       Precautions and Contraindications:   Falls, standard, bleeding    Activity Orders  PMP    Consult received for Scott Sours. for PT Evaluation and Treatment.  Patient's medical condition is appropriate for Physical Therapy intervention at this time.    History of Present Illness:   Scott Monroe. is a 76 y.o. male admitted on 04/16/2021 with Atrial fibrillation with RVR [I48.91]    Past Medical/Surgical History:  Past Medical History:   Diagnosis Date    Hyperlipidemia     Hypertension     Insomnia     Irregular heart beat     Restless leg     Sleep apnea      Past Surgical History:   Procedure Laterality Date    BACK SURGERY      SPINAL CORD STIMULATOR IMPLANT          Imaging/Tests/Labs:  US Venous Duplex Doppler Leg Bilateral    Result Date: 04/17/2021   No sonographic evidence for right or left lower extremity deep venous thrombosis. Mills Koller, MD 04/17/2021 9:22 AM    XR Chest  AP Portable    Result Date: 04/16/2021   Cardiomegaly. No pulmonary edema. Jasmine December D'Heureux, MD 04/16/2021 6:35 PM   Recent Labs     04/17/21  0148   Hgb 14.0   Hematocrit 42.6            Social History:   Prior Level of Function: independent with ADLs and ambulation  Assistive Devices: none  Baseline Activity: community ambulation  DME Currently at Home: none   Home Living Arrangements: lives independently and shares common space with ex-wife and her husband, daughter  Type of Home: one level  Home Layout: no stairs    Subjective:   "I'm doing well"  Patient is agreeable to participation in the therapy session. Nursing clears patient for therapy.     Patient Goal: return home    Pain: none.  Discussed L shoulder pain which developed after lifting mattress in Oct 2022.      Objective:   Patient received  in bed  with IV  in place. Pt  wore mask during therapy session:No      Cognitive Status and Neuro Exam:  Alert and oriented x 4, follows multiple commands    Musculoskeletal Examination  RUE ROM: WFL  LUE ROM: WFL  RLE ROM: WFL  LLE ROM: WFL    RUE Strength: WFL  LUE Strength: WFL  RLE Strength: WFL  LLE Strength: WFL    Functional Mobility  Rolling: independent  Supine to Sit: independent  Scooting: independent  Sit to Stand: independent  Stand to Sit: independent  Transfers: independent    PMP - Progressive Mobility Protocol  PMP Activity - Step 6  Distance Walked (ft) (Step 6/7): 100 feet    Ambulation  Level of assistance required: independent  Pattern: normal cadence, normal step and stride; stable with quick turns in place while managing IV pole (pushed by therapist)  Device Used: none    Balance  Static Sitting: good  Dynamic Sitting: good  Static Standing: good  Dynamic Standing:  good    Participation and Activity Tolerance  Participation Effort: Good  Endurance: Does not limit participation with activity    Patient left with call bell within reach, SCDs off as found, fall mat, bed alarm in place, all needs met and all questions answered. RN notified of session outcome and patient response.     PPE worn during session: procedural mask and face shield    Tech present: n/a  PPE worn by tech: N/A      Time of Treatment  PT Received On: 04/17/21  Start Time: 1110  Stop Time: 1155  Time Calculation (min): 45 min    Mathews Argyle, PT 04/17/2021 1:54 PM

## 2021-04-18 ENCOUNTER — Inpatient Hospital Stay (HOSPITAL_COMMUNITY): Payer: Non-veteran care

## 2021-04-18 DIAGNOSIS — I4891 Unspecified atrial fibrillation: Secondary | ICD-10-CM

## 2021-04-18 DIAGNOSIS — M9902 Segmental and somatic dysfunction of thoracic region: Secondary | ICD-10-CM | POA: Diagnosis not present

## 2021-04-18 DIAGNOSIS — M9904 Segmental and somatic dysfunction of sacral region: Secondary | ICD-10-CM | POA: Diagnosis not present

## 2021-04-18 DIAGNOSIS — M9903 Segmental and somatic dysfunction of lumbar region: Secondary | ICD-10-CM | POA: Diagnosis not present

## 2021-04-18 DIAGNOSIS — M9901 Segmental and somatic dysfunction of cervical region: Secondary | ICD-10-CM | POA: Diagnosis not present

## 2021-04-18 LAB — ECHOCARDIOGRAM ADULT COMPLETE W CLR/ DOPP WAVEFORM
AV Area (Cont Eq VTI): 1.64
AV Area (Cont Eq VTI): 1.82
AV Mean Gradient: 3
AV Mean Gradient: 5
AV Mean Gradient: 6
AV Peak Velocity: 125
AV Peak Velocity: 153
AV Peak Velocity: 165
Ao Root Diameter (2D): 3.3
BP Mod LV Ejection Fraction: 29.8
IVS Diastolic Thickness (2D): 1.2
LA Dimension (2D): 4.5
LA Volume Index (BP A-L): 0.047
LVID diastole (2D): 5.1
LVID systole (2D): 4.1
MV Area (PHT): 4.36
MV E/e' (Average): 22.516
Prox Ascending Aorta Diameter: 3.3
Pulmonary Valve Findings: NORMAL
RV Basal Diastolic Dimension: 3.9
RV Function: DECREASED
RV Systolic Pressure: 47.902
RV Systolic Pressure: 48
Site RV Size (AS): NORMAL
TAPSE: 0.922
TAPSE: 0.968
TAPSE: 1.01
TAPSE: 1.24
Tricuspid Valve Findings: NORMAL

## 2021-04-18 LAB — BASIC METABOLIC PANEL
Anion Gap: 10 (ref 5.0–15.0)
BUN: 21 mg/dL (ref 9.0–28.0)
CO2: 21 mEq/L (ref 17–29)
Calcium: 8.7 mg/dL (ref 7.9–10.2)
Chloride: 101 mEq/L (ref 99–111)
Creatinine: 1.1 mg/dL (ref 0.5–1.5)
Glucose: 213 mg/dL — ABNORMAL HIGH (ref 70–100)
Potassium: 3.8 mEq/L (ref 3.5–5.3)
Sodium: 132 mEq/L — ABNORMAL LOW (ref 135–145)

## 2021-04-18 LAB — CBC AND DIFFERENTIAL
Absolute NRBC: 0 10*3/uL (ref 0.00–0.00)
Basophils Absolute Automated: 0.08 10*3/uL (ref 0.00–0.08)
Basophils Automated: 1.1 %
Eosinophils Absolute Automated: 0.37 10*3/uL (ref 0.00–0.44)
Eosinophils Automated: 5.1 %
Hematocrit: 40.2 % (ref 37.6–49.6)
Hgb: 13.5 g/dL (ref 12.5–17.1)
Immature Granulocytes Absolute: 0.04 10*3/uL (ref 0.00–0.07)
Immature Granulocytes: 0.5 %
Instrument Absolute Neutrophil Count: 3.96 10*3/uL (ref 1.10–6.33)
Lymphocytes Absolute Automated: 2.2 10*3/uL (ref 0.42–3.22)
Lymphocytes Automated: 30.2 %
MCH: 26.5 pg (ref 25.1–33.5)
MCHC: 33.6 g/dL (ref 31.5–35.8)
MCV: 79 fL (ref 78.0–96.0)
MPV: 10 fL (ref 8.9–12.5)
Monocytes Absolute Automated: 0.63 10*3/uL (ref 0.21–0.85)
Monocytes: 8.7 %
Neutrophils Absolute: 3.96 10*3/uL (ref 1.10–6.33)
Neutrophils: 54.4 %
Nucleated RBC: 0 /100 WBC (ref 0.0–0.0)
Platelets: 164 10*3/uL (ref 142–346)
RBC: 5.09 10*6/uL (ref 4.20–5.90)
RDW: 15 % (ref 11–15)
WBC: 7.28 10*3/uL (ref 3.10–9.50)

## 2021-04-18 LAB — ANTI-XA,UFH: Anti-Xa, UFH: 0.29 IU/mL

## 2021-04-18 LAB — GLUCOSE WHOLE BLOOD - POCT
Whole Blood Glucose POCT: 162 mg/dL — ABNORMAL HIGH (ref 70–100)
Whole Blood Glucose POCT: 180 mg/dL — ABNORMAL HIGH (ref 70–100)
Whole Blood Glucose POCT: 194 mg/dL — ABNORMAL HIGH (ref 70–100)
Whole Blood Glucose POCT: 206 mg/dL — ABNORMAL HIGH (ref 70–100)
Whole Blood Glucose POCT: 208 mg/dL — ABNORMAL HIGH (ref 70–100)
Whole Blood Glucose POCT: 285 mg/dL — ABNORMAL HIGH (ref 70–100)
Whole Blood Glucose POCT: 291 mg/dL — ABNORMAL HIGH (ref 70–100)

## 2021-04-18 LAB — GFR: EGFR: >60

## 2021-04-18 LAB — MAGNESIUM: Magnesium: 1.8 mg/dL (ref 1.6–2.6)

## 2021-04-18 MED ORDER — APIXABAN 5 MG PO TABS
5.0000 mg | ORAL_TABLET | Freq: Two times a day (BID) | ORAL | Status: DC
Start: 2021-04-25 — End: 2021-04-19

## 2021-04-18 MED ORDER — INSULIN LISPRO 100 UNIT/ML SOLN (WRAP)
8.0000 [IU] | Freq: Three times a day (TID) | Status: DC
Start: 2021-04-18 — End: 2021-04-19
  Administered 2021-04-18 – 2021-04-19 (×3): 8 [IU] via SUBCUTANEOUS
  Filled 2021-04-18 (×3): qty 24

## 2021-04-18 MED ORDER — INSULIN LISPRO 100 UNIT/ML SOLN (WRAP)
1.0000 [IU] | Status: DC
Start: 2021-04-18 — End: 2021-04-19
  Administered 2021-04-18: 5 [IU] via SUBCUTANEOUS
  Administered 2021-04-18 – 2021-04-19 (×4): 3 [IU] via SUBCUTANEOUS
  Filled 2021-04-18: qty 9

## 2021-04-18 MED ORDER — MAGNESIUM SULFATE IN D5W 1-5 GM/100ML-% IV SOLN
1.0000 g | INTRAVENOUS | Status: DC | PRN
Start: 2021-04-18 — End: 2021-04-19
  Administered 2021-04-18 (×2): 1 g via INTRAVENOUS
  Filled 2021-04-18 (×2): qty 100

## 2021-04-18 MED ORDER — POTASSIUM & SODIUM PHOSPHATES 280-160-250 MG PO PACK
2.0000 | PACK | ORAL | Status: DC | PRN
Start: 2021-04-18 — End: 2021-04-19

## 2021-04-18 MED ORDER — METOPROLOL SUCCINATE ER 25 MG PO TB24
25.0000 mg | ORAL_TABLET | Freq: Two times a day (BID) | ORAL | Status: DC
Start: 2021-04-18 — End: 2021-04-19
  Administered 2021-04-18 – 2021-04-19 (×3): 25 mg via ORAL
  Filled 2021-04-18 (×3): qty 1

## 2021-04-18 MED ORDER — POTASSIUM CHLORIDE 10 MEQ/100ML IV SOLN
10.0000 meq | INTRAVENOUS | Status: DC | PRN
Start: 2021-04-18 — End: 2021-04-19

## 2021-04-18 MED ORDER — APIXABAN 5 MG PO TABS
5.0000 mg | ORAL_TABLET | Freq: Two times a day (BID) | ORAL | Status: DC
Start: 2021-04-18 — End: 2021-04-18
  Administered 2021-04-18: 5 mg via ORAL
  Filled 2021-04-18: qty 1

## 2021-04-18 MED ORDER — APIXABAN 5 MG PO TABS
10.0000 mg | ORAL_TABLET | Freq: Two times a day (BID) | ORAL | Status: DC
Start: 2021-04-18 — End: 2021-04-19
  Administered 2021-04-18 – 2021-04-19 (×2): 10 mg via ORAL
  Filled 2021-04-18 (×2): qty 2

## 2021-04-18 MED ORDER — POTASSIUM CHLORIDE CRYS ER 20 MEQ PO TBCR
0.0000 meq | EXTENDED_RELEASE_TABLET | ORAL | Status: DC | PRN
Start: 2021-04-18 — End: 2021-04-19

## 2021-04-18 MED ORDER — DILTIAZEM HCL ER 90 MG PO CP12
90.0000 mg | ORAL_CAPSULE | Freq: Two times a day (BID) | ORAL | Status: DC
Start: 2021-04-18 — End: 2021-04-18
  Administered 2021-04-18: 90 mg via ORAL
  Filled 2021-04-18 (×6): qty 1

## 2021-04-18 MED ORDER — INSULIN NPH (HUMAN) (ISOPHANE) 100 UNIT/ML SC SUSP
10.0000 [IU] | Freq: Every evening | SUBCUTANEOUS | Status: DC
Start: 2021-04-18 — End: 2021-04-19
  Administered 2021-04-18: 10 [IU] via SUBCUTANEOUS

## 2021-04-18 MED ORDER — VALSARTAN 40 MG PO TABS
40.0000 mg | ORAL_TABLET | Freq: Every day | ORAL | Status: DC
Start: 2021-04-19 — End: 2021-04-19
  Administered 2021-04-19: 40 mg via ORAL
  Filled 2021-04-18: qty 1

## 2021-04-18 NOTE — Plan of Care (Addendum)
Pt name/Code: Scott Monroe. (76 y.o. male);NO CPR - SUPPORT OK  Admit Date/Dx: 04/16/2021 Atrial fibrillation with RVR  Weights: Last 3 Weights for the past 72 hrs (Last 3 readings):   Weight   04/17/21 0522 69.4 kg (152 lb 14.4 oz)   04/16/21 1906 69.8 kg (153 lb 14.4 oz)   04/16/21 1612 77.1 kg (170 lb)       Shift comment:  Pt remembered what type of sleeping aid he used in the past, Trazodone, which was given overnight. Pt slept without any complaints.  Mag was 1.8, pt is currently receiving 1 out of 2g of Mag.    Heparin drip running at 18 units/kg/hr  Diltiazem drip running at 15mg /hr, with HR ranging in 64-94    Plan:   -Next Anti-Xa will be at 1200 (noon)   - Monitor Blood Glucose  - Start new insulin regimen; NPH 15 units daily. Hospital BG goal is 140-180.  - ECHO today  - Monitor Heart rhythm, if rhythm converts to Sinus Rhythm and rates are controlled do EKG.       Care Plan:  Problem: Moderate/High Fall Risk Score >5  Goal: Patient will remain free of falls  Outcome: Progressing

## 2021-04-18 NOTE — Plan of Care (Signed)
Problem: Moderate/High Fall Risk Score >5  Goal: Patient will remain free of falls  Outcome: Progressing  Flowsheets (Taken 04/18/2021 1720)  Moderate Risk (6-13):   MOD-Consider activation of bed alarm if appropriate   MOD-Floor mat at bedside (where available) if appropriate   MOD-Remain with patient during toileting   MOD-Re-orient confused patients   MOD- Consider video monitoring     Shift Note     Orientation: A/Ox4  Rhythm on tele: A-Fib, HR 90-low 100's  Oxygen/airway: on RA sating 94%  Pain: denied  Lines/Drips: PIV  GI/GU: cont x 2, LBM: 4/3  Ambulation: 2 laps around the unit  Fall Score: 9 Fall Risk. Moderate fall precautions maintained throughout shift.     Purposefully hourly rounding completed  Belongings and call bell made within reach.   Will continue to monitor and assess for changes in pt status.     Critical Lab/Imaging/Procedures:  - Mg 1.8     Comments:   -Replaced mg x2 bags     Plan:   - Echo complete;  EF is worse 30%  - Biventricular failure with thrombus;  Has an LV thrombus;    - IV hep and dilt gtt switched to PO dilt and eliquis  - Incr Eliquis dosing;   - Start GDMT and changing dilt to metoprolol XL;   - Poss [place] tomorrow

## 2021-04-18 NOTE — Progress Notes (Signed)
04/18/21 1608   Case Management Quick Doc   Date of 1st IMM letter 04/18/21  (Patient sign)     Laural Roes  Case Management Assistant  Case Management Department  [facility]  P: [phone] [email or web address]

## 2021-04-18 NOTE — Progress Notes (Addendum)
MEDICINE PROGRESS NOTE    Date Time: 04/18/21   Patient Name: Scott Monroe, Scott Monroe.  Attending Physician: Diona Foley, MD    Hospital Course:     76 yo male with PMHx notable for pAfib, DM2 on insulin, GERD and med-nonadherence 2/2 financial barriers who presents to the hospital with BLE edema, chest pain and hyperglycemia, found to have Afib with RVR.    On admission, patient still heart rates poorly controlled on diltiazem drip.  Cardiology was consulted for further recommendations.  Endocrinology was consulted given hemoglobin A1c 14% in light of financial restrictions.     Assessment/Plan:   #Paroxysmal Atrial Fibrillation with RVR   #Chest pain  #Acute systolic HF, suspect tachycardia mediated  CM  CHADS-VASc = 3. 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     #Insulin Dependent Diabetes Mellitus  #Hyperglycemia w/o Anion Gap  - Last A1c: 11.7% 8/22; Repeat A1c 14% (this admission)  - Insulin regimen (per Endocrinology):   - NPH 15u with breakfast, 10u with dinner   - lispro 8u TID AC   - Was previously supposed to be on Glipizide 5mg  BID based on 8/22 admission  - Continue gabapentin 300mg  TID     #GERD  - Continue  PPI     #HTN  - Starting ARB tomorrow     #HLD  - Previously on Atorvastatin 20mg , has not taken anything since 8/22 admission  - Continue Lipitor 80mg       #OSA  - will need CPAP arranged outpatient.      #Nicotine Use  - Continue Nicotine patch     #Dispo  - [employer] on discharge  - Appreciate CM assistance in determining what  coverage he is eligible for    Case discussed with: Pt, RN,  cardiology    Safety Checklist:     DVT prophylaxis:  CHEST guideline (See page [id number]) Chemical and / or mechanical ppx NOT indicated or contraindicated: Fully anticoagulated    Foley:  [place] Rn Foley protocol Not present   IVs:  Peripheral IV   PT/OT: Ordered   Daily CBC & or Chem ordered:  SHM/ABIM guidelines (see #5) Yes, due to clinical and lab instability   Reference for approximate charges of common labs: CBC auto diff - $76  BMP - $99  Mg - $79    Lines:     Patient Lines/Drains/Airways Status       Active PICC Line / CVC Line / PIV Line / Drain / Airway / Intraosseous Line / Epidural Line / ART Line / Line / Wound / Pressure Ulcer / NG/OG Tube       Name Placement date Placement time Site Days    Peripheral IV 04/16/21 18 G Standard Right Antecubital 04/16/21  1645  Antecubital  1    Peripheral IV 04/16/21 20 G Standard Anterior;Left Forearm 04/16/21  1744  Forearm  less than 1    Peripheral IV 04/17/21 22 G Posterior;Right Forearm 04/17/21  0152  Forearm  less than 1  Disposition: (Please see PAF column for Expected D/C Date)   Today's date: 04/18/2021  Admit Date: 04/16/2021  4:26 PM  LOS: 2  Clinical Milestones: Pending AFib and hyperglycemia improvement  Anticipated discharge needs: Pending      Subjective     CC: Atrial fibrillation with RVR    HPI/Subjective/ Interval: No acute events overnight.  Patient remains with fast heart rates.  Currently asymptomatic.  Feels well otherwise.  Denies any shortness of breath or abdominal pain.  No nausea or vomiting.    Review of Systems:     As per HPI    Physical Exam:     VITAL SIGNS PHYSICAL EXAM   Temp:  [97.4 F (36.3 C)-98.2 F (36.8 C)] 97.6 F (36.4 C)  Heart Rate:  [80-102] 82  Resp Rate:  [16-18] 18  BP: (108-157)/(63-92) 116/65  Blood Glucose: 206-291    Telemetry: Reviewed, rate controlled afib    Intake/Output Summary (Last 24 hours) at 04/18/2021 2325  Last data filed at 04/18/2021 2138  Gross per 24 hour   Intake 832.29 ml    Output 2015 ml   Net -1182.71 ml    General: awake, alert and oriented x 3  Cardiovascular: irregularly irregular o murmurs, rubs or gallops  Lungs: clear to auscultation bilaterally, without wheezing, rhonchi, or rales  Abdomen: soft, non-tender, non-distended; no palpable masses,  normoactive bowel sounds  Extremities: no edema       Meds:     Medications were reviewed:  Current Facility-Administered Medications   Medication Dose Route Frequency    apixaban  10 mg Oral Q12H SCH    [START ON 04/25/2021] apixaban  5 mg Oral Q12H SCH    atorvastatin  80 mg Oral QHS    gabapentin  300 mg Oral Q8H SCH    insulin lispro  1-8 Units Subcutaneous Q4H SCH    insulin lispro  8 Units Subcutaneous TID AC    insulin NPH  10 Units Subcutaneous QHS    insulin NPH  15 Units Subcutaneous QAM AC    metoprolol succinate XL  25 mg Oral BID    nicotine  1 patch Transdermal Daily    pantoprazole  40 mg Oral QAM AC    traZODone  50 mg Oral QHS    [START ON 04/19/2021] valsartan  40 mg Oral Daily     Current Facility-Administered Medications   Medication Dose Route Frequency Last Rate     Current Facility-Administered Medications   Medication Dose Route    acetaminophen  650 mg Oral    dextrose  15 g of glucose Oral    Or    dextrose  12.5 g Intravenous    Or    dextrose  12.5 g Intravenous    Or    glucagon (rDNA)  1 mg Intramuscular    heparin (porcine)  3,000 Units Intravenous    magnesium sulfate  1 g Intravenous    melatonin  3 mg Oral    naloxone  0.2 mg Intravenous    potassium & sodium phosphates  2 packet Oral    potassium chloride  0-40 mEq Oral    And    potassium chloride  10 mEq Intravenous         Labs:     Labs (last 72 hours):    Recent Labs   Lab 04/18/21  0404 04/17/21  0148   WBC 7.28 8.47   Hgb 13.5 14.0   Hematocrit 40.2 42.6  Platelets 164 190       Recent Labs   Lab 04/16/21  2117 04/16/21  1623   PT  --  11.6   PT INR  --  1.0   PTT 27 29    Recent Labs   Lab 04/18/21  0404 04/17/21  0148 04/16/21  1623   Sodium  132* 130* 128*   Potassium 3.8 3.7 4.6   Chloride 101 96* 98*   CO2 21 22 19    BUN 21.0 13.0 12.0   Creatinine 1.1 1.2 1.1   Calcium 8.7 9.1 8.9   Albumin  --   --  3.3*   Protein, Total  --   --  6.3   Bilirubin, Total  --   --  0.8   Alkaline Phosphatase  --   --  154*   ALT  --   --  26   AST (SGOT)  --   --  23   Glucose 213* 355* 449*          Microbiology, reviewed and are significant for:  Microbiology Results (last 15 days)       Procedure Component Value Units Date/Time    COVID-19 (SARS-CoV-2) and Influenza A/B, NAA (Liat Rapid)- Admission [[id number]] Collected: 04/16/21 2306    Order Status: Completed Specimen: Culturette from Nasopharyngeal Updated: 04/16/21 2350     Purpose of COVID testing Diagnostic -PUI     SARS-CoV-2 Specimen Source Nasal Swab     SARS CoV 2 Overall Result Not Detected     Comment: __________________________________________________  -A result of "Detected" indicates POSITIVE for the    presence of SARS CoV-2 RNA  -A result of "Not Detected" indicates NEGATIVE for the    presence of SARS CoV-2 RNA  __________________________________________________________  Test performed using the Roche cobas Liat SARS-CoV-2 assay. This assay is  only for use under the Food and Drug Administration's Emergency Use  Authorization. This is a real-time RT-PCR assay for the qualitative  detection of SARS-CoV-2 RNA. Viral nucleic acids may persist in vivo,  independent of viability. Detection of viral nucleic acid does not imply the  presence of infectious virus, or that virus nucleic acid is the cause of  clinical symptoms. Negative results do not preclude SARS-CoV-2 infection and  should not be used as the sole basis for diagnosis, treatment or other  patient management decisions. Negative results must be combined with  clinical observations, patient history, and/or epidemiological information.  Invalid results may be due to inhibiting substances in the specimen and  recollection should occur. Please  see Fact Sheets for patients and providers  located:  WirelessDSLBlog.no          Influenza A Not Detected     Influenza B Not Detected     Comment: Test performed using the Roche cobas Liat SARS-CoV-2 & Influenza A/B assay.  This assay is only for use under the Food and Drug Administration's  Emergency Use Authorization. This is a multiplex real-time RT-PCR assay  intended for the simultaneous in vitro qualitative detection and  differentiation of SARS-CoV-2, influenza A, and influenza B virus RNA. Viral  nucleic acids may persist in vivo, independent of viability. Detection of  viral nucleic acid does not imply the presence of infectious virus, or that  virus nucleic acid is the cause of clinical symptoms. Negative results do  not preclude SARS-CoV-2, influenza A, and/or influenza B infection and  should not be used as the sole basis for diagnosis, treatment or other  patient management decisions. Negative results must be combined with  clinical observations, patient history, and/or epidemiological information.  Invalid results may be due to inhibiting substances in the specimen and  recollection should occur. Please see Fact Sheets for patients and providers  located: http://www.rice.biz/.         Narrative:      o Collect and clearly label specimen type:  o PREFERRED-Upper respiratory specimen: One Nasal Swab in  Transport Media.  o Hand deliver to laboratory ASAP  Diagnostic -PUI  Rescheduled by [id number] at 04/16/2021 23:05 Reason: I clicked the wrong   button, meant to hit no            Imaging, reviewed and are significant for:    Echo Results       Procedure Component Value Units Date/Time    Echocardiogram Adult Complete W Clr/ Dopp Waveform [[id number]] Collected: 04/18/21 1203     Updated: 04/18/21 1357     BP Mod LV Ejection Fraction 29.8     IVS Diastolic Thickness (2D) 1.2     LVID diastole (2D) 5.1     LVID systole (2D) 4.1     LA Dimension (2D) 4.5     AV  Mean Gradient 6     AV Mean Gradient 5     AV Mean Gradient 3     AV Peak Velocity 165     AV Peak Velocity 153     AV Peak Velocity 125     AV Area (Cont Eq VTI) 1.82     AV Area (Cont Eq VTI) 1.64     Ao Root Diameter (2D) 3.3     Prox Ascending Aorta Diameter 3.3     RV Basal Diastolic Dimension 3.9     RV Systolic Pressure 47.902     RV Systolic Pressure 48     TAPSE 1.01     TAPSE 0.968     TAPSE 1.24     TAPSE 0.922     MV Area (PHT) 4.36     LA Volume Index (BP A-L) 0.047     MV E/e' (Average) 22.516     Site RV Size (AS) normal in size     RV Function Moderately decreased     Site RA Size (AS) mildly dilated     MV Regurgitation Severity mild     TV Regurgitation Severity mild     AV Regurgitation Severity no     Aortic Stenosis Severity no     Site PV Stenosis (AS) no Doppler evidence of pulmonic valve sclerosis or stenosis     PV Regurgitation Severity mild     Atrial Septum Findings No evidence of interatrial shunt by color Doppler.     Aortic Valve Findings The aortic valve is tricuspid and sclerotic.     Aortic Valve Findings There is no aortic stenosis.     Aortic Valve Findings There is no aortic regurgitation.     Pulmonary Valve Findings The pulmonic valve is structurally normal.     Pulmonary Valve Findings There is mild pulmonic regurgitation.     Mitral Valve Findings The mitral valve is thickened and calcified.     Mitral Valve Findings There is mild mitral regurgitation.     Tricuspid Valve Findings The tricuspid valve is structurally normal.     Tricuspid Valve Findings There is mild tricuspid regurgitation.     Tricuspid Valve Findings Moderate pulmonary hypertension with estimated right ventricular systolic pressure of  48  mmHg.    Narrative:      Name:     Sigismund Cross  Age:     89 years  DOB:     [birth year]  Gender:     Male  MRN:     [id number]  Wt:     157 lb  BSA:     1.84 m2  Systolic BP:     213 mmHg  Diastolic BP:     92 mmHg  Technical Quality:     Adequate  Exam Date/Time:      04/18/2021 12:03 PM  Study Site:     [facility]  Exam Type:     ECHOCARDIOGRAM ADULT COMPLETE W CLR/ DOPP WAVEFORM    Study Info  Indications      Shortness of breath with high cardiac risk -  Procedure    Complete two-dimensional, color flow and spectral Doppler transthoracic  echocardiogram is performed. Three dimensional echocardiographic imaging is  performed during the transthoracic echocardiogram.    Staff  Sonographer:     Ali Al-Qaraghuli RDCS, RVT  Ordering Physician:     Monica Becton    67 in      History/Risk Factors  Hypertension:     Yes  Hyperlipidemia:     Yes  Tobacco Use:     Yes  Arrhythmias:     Atrial fibrillation    Prior Cardiac Studies  Date of Prior Echo:     09/13/20      Summary    * Left ventricular systolic function is severely decreased with an ejection  fraction by Biplane Method of Discs of  30 %.    * There is mild concentric left ventricular hypertrophy.    * Moderate global hypokinesis without regional variation.    * Ungraded diastolic filling parameters in atrial fibrillation.    * LV apical mass (thrombus), measuring 1.1 x 0.7 cm.    * The right ventricular cavity size is normal in size.    * Moderate to severely decreased right ventricular systolic function.    * TAPSE = 0.9 cm, S' = 9 cm/s, fractional area change = 17 %, 3D EF 17%.    * The left atrium is moderately dilated.    * The right atrium is mildly dilated.    * The mitral valve is thickened and calcified.    * There is mild mitral regurgitation.    * Moderate pulmonary hypertension with estimated right ventricular systolic  pressure of  48 mmHg.    * The inferior vena cava is dilated with poor inspiratory collapse,  consistent with elevated right atrial pressure.    * Trivial to small circumferential pericardial effusion visualized.    * Clinical service was notified of results by securechat.    * Since prior echo Sep 13, 2020 reviewed, EF 63 to 30%, RV dysFx new, PHT new,  effusion new.      Findings  Left Ventricle    The left  ventricle is normal in size. There is mild concentric left  ventricular hypertrophy. Left ventricular systolic function is severely  decreased with an ejection fraction by Biplane Method of Discs of  30 %.  Moderate global hypokinesis without regional variation. Ungraded diastolic  filling parameters in atrial fibrillation. LV apical mass (thrombus),  measuring 1.1 x 0.7 cm.  Right Ventricle    The right ventricular cavity size is normal in size. Moderate to severely  decreased right ventricular systolic function. TAPSE = 0.9 cm, S' =  9 cm/s,  fractional area change = 17 %, 3D EF 17%.      Left Atrium    The left atrium is moderately dilated.    Right Atrium    The right atrium is mildly dilated.    Atrial Septum    No evidence of interatrial shunt by color Doppler.      Aortic Valve    The aortic valve is tricuspid and sclerotic. There is no aortic stenosis.  There is no aortic regurgitation.    Pulmonary Valve    The pulmonic valve is structurally normal. There is mild pulmonic  regurgitation.    Mitral Valve    The mitral valve is thickened and calcified. There is mild mitral  regurgitation.    Tricuspid Valve    The tricuspid valve is structurally normal. There is mild tricuspid  regurgitation. Moderate pulmonary hypertension with estimated right  ventricular systolic pressure of  48 mmHg.      Aorta    The aortic root is normal in size. The ascending aorta is normal in size.    Inferior Vena Cava    The inferior vena cava is dilated with poor inspiratory collapse, consistent  with elevated right atrial pressure.    Pericardium / Pleural Effusion    Trivial to small circumferential pericardial effusion visualized. No  echocardiographic evidence of cardiac tamponade.      Measurements  2D Measurements  ----------------------------------------------------------------------  Name                                 Value         Normal  ----------------------------------------------------------------------    Parasternal 2D  ----------------------------------------------------------------------   IVS Diastolic Thickness  (2D)                               1.20 cm     0.60-1.00   LVID Diastole (2D)                 5.10 cm     4.20-5.80    LVIW Diastolic Thickness  (2D)                               1.20 cm     0.60-1.05   LVID Systole (2D)                  4.10 cm     2.50-4.00   LVOT Diameter                      2.00 cm                 LA Dimension (2D)                  4.50 cm     3.00-4.10   Ao Root Diameter (2D)              3.30 cm     2.70-3.70   Prox Asc Ao Diameter               3.30 cm     2.60-3.40     LV Ejection Fraction 2D  ----------------------------------------------------------------------  LV EF (BP MOD)  30 %         52-72     Apical 2D Dimensions  ----------------------------------------------------------------------   RV Basal Diastolic  Dimension                          3.90 cm     2.50-4.10   LA Volume Index (BP A-L)        46.77 ml/m2       <=34.00   RA Area (4C)                     20.70 cm2       <=18.00  M-mode Measurements  ----------------------------------------------------------------------  Name                                 Value        Normal  ----------------------------------------------------------------------    M-Mode  ----------------------------------------------------------------------  TAPSE                              0.92 cm        >=1.60  LVOT/Aortic Valve Doppler Measurements  ----------------------------------------------------------------------  Name                                 Value        Normal  ----------------------------------------------------------------------    LVOT Doppler  ----------------------------------------------------------------------  LVOT Peak Velocity                1.01 m/s                   AoV  Doppler  ----------------------------------------------------------------------  AV Peak Velocity                  1.48 m/s                 AV Peak Gradient                   11 mmHg                 AV Mean Gradient                    5 mmHg           <20   AV Area (Cont Eq VTI)             1.73 cm2        >=3.00   AV V1/V2 Ratio                        0.69  RVOT/Pulmonic Valve Doppler Measurements  ----------------------------------------------------------------------  Name                                 Value        Normal  ----------------------------------------------------------------------    PV Doppler  ----------------------------------------------------------------------  PV Peak Velocity                  0.66 m/s  Mitral Valve Measurements  ----------------------------------------------------------------------  Name  Value        Normal  ----------------------------------------------------------------------    MV Doppler  ----------------------------------------------------------------------  MV E Peak Velocity                1.23 m/s                   MV Annular TDI  ----------------------------------------------------------------------  MV Septal e' Velocity             0.05 m/s        >=0.08   MV E/e' (Septal)                     25.83        <=8.00   MV Lateral e' Velocity            0.06 m/s        >=0.10   MV E/e' (Lateral)                    19.20        <=8.00  Tricuspid Valve Measurements  ----------------------------------------------------------------------  Name                                 Value        Normal  ----------------------------------------------------------------------    TV Regurgitation Doppler  ----------------------------------------------------------------------  TR Peak Velocity                  2.87 m/s                 RA Pressure                        15 mmHg           <=3   RV Systolic Pressure               48 mmHg           <36  Aorta /  Venous Measurements  ----------------------------------------------------------------------  Name                                 Value        Normal  ----------------------------------------------------------------------    IVC/SVC  ----------------------------------------------------------------------  IVC Diameter (Exp 2D)              2.40 cm        <=2.10      Report Signatures  Finalized by Deliah Goody  MD on 04/18/2021 01:12 PM  Preliminary by Nona Dell  RDCS, RVT on 04/18/2021 12:53 PM          Signed by: Diona Foley, MD

## 2021-04-18 NOTE — Progress Notes (Addendum)
[facility] PROGRESS NOTE     Date Time:    04/18/21    10:29 AM  Patient Name: Scott SoursJohn Wesley Piccininni Jr.    LOS: 2 days     Subjective:  Feels better but still tired      Scheduled Meds:   atorvastatin, 80 mg, Oral, QHS  gabapentin, 300 mg, Oral, Q8H SCH  insulin lispro, 1-8 Units, Subcutaneous, Q4H SCH  insulin lispro, 8 Units, Subcutaneous, TID AC  insulin NPH, 10 Units, Subcutaneous, QHS  insulin NPH, 15 Units, Subcutaneous, QAM AC  nicotine, 1 patch, Transdermal, Daily  pantoprazole, 40 mg, Oral, QAM AC  traZODone, 50 mg, Oral, QHS     Continuous Infusions:    dilTIAZem 15 mg/hr (04/18/21 0348)    heparin infusion 25,000 units/500 mL (Cardiac/Low Intensity) 18 Units/kg/hr (04/18/21 0524)     PRN Meds: acetaminophen, dextrose **OR** dextrose **OR** dextrose **OR** glucagon (rDNA), heparin (porcine), magnesium sulfate, melatonin, naloxone, potassium & sodium phosphates, potassium chloride **AND** potassium chloride    Objective:  Physical Exam:  BP (!) 134/92   Pulse 94   Temp 97.7 F (36.5 C) (Oral)   Resp 17   Ht 1.702 m (5\' 7" )   Wt 71.1 kg (156 lb 11.2 oz)   SpO2 92%   BMI 24.54 kg/m      Intake/Output Summary (Last 24 hours) at 04/18/2021 0700  Last data filed at 04/18/2021 0700  Gross per 24 hour   Intake 2661.68 ml   Output 1695 ml   Net 966.68 ml     Wt Readings from Last 1 Encounters:   04/18/21 0740 71.1 kg (156 lb 11.2 oz)   04/17/21 0522 69.4 kg (152 lb 14.4 oz)   04/16/21 1906 69.8 kg (153 lb 14.4 oz)   04/16/21 1612 77.1 kg (170 lb)     General: awake, alert, oriented x 3, no acute distress.  Cardiovascular: irregular rate and rhythm, no murmurs, rubs or gallops     Lungs: Mild ronchi improved with coughin  Abdomen: soft, non-tender, non-distended, no hepatosplenomegaly, normoactive bowel sounds  Extremities: no clubbing, cyanosis, or edema     Lab Review:   Recent Labs     04/18/21  0404 04/17/21  0148 04/16/21  1623   WBC 7.28 8.47 8.10   Hgb 13.5 14.0 15.2   Hematocrit 40.2 42.6 46.6    Platelets 164 190 184     Recent Labs     04/17/21  0148 04/16/21  2215 04/16/21  2117 04/16/21  1623   hs Troponin-I 30.1 32.9  --  31.0   hs Troponin-I Delta -2.8  --   --   --    NT-proBNP  --   --   --  9,1474,162*   D-Dimer  --   --  0.68*  --     Recent Labs     04/18/21  0404 04/17/21  0734 04/17/21  0148 04/16/21  1623   Sodium 132*  --  130* 128*   Potassium 3.8  --  3.7 4.6   CO2 21  --  22 19   BUN 21.0  --  13.0 12.0   Creatinine 1.1  --  1.2 1.1   Glucose 213*  --  355* 449*   Magnesium 1.8 2.3 1.4* 1.7    Estimated Creatinine Clearance: 55.9 mL/min (based on SCr of 1.1 mg/dL).   Recent Labs     04/16/21  1623   PT INR 1.0   TSH 0.98  Telemetry: Afib, avg HR 78-90     Imaging:  Radiology Results (24 Hour)       ** No results found for the last 24 hours. **            Assessment/Plan:     #New cardiomyopathy with LV thrombus  - Suspect tachycardia induced  - Can still use eliquis off label for LV thrombus  - Change dilt to toprol 25mg  BID  - Add valsartan 40mg  tomorrow  - Defer ischemic evaluation for now. Would like to start GDMT first and monitor for his clinical response and adherence      #Uncontrolled DM2  - Endo consulted      Signed by: , MD, [facility]  [facility]   [facility] NP Spectralink [phone] or [phone] (M-F 8 am-5 pm)   [id number] [facility] Spectralink [phone]  (M-F 8 am-5 pm)  After Hours On-Call Physician: [phone]

## 2021-04-18 NOTE — Progress Notes (Signed)
ENDOCRINE PROGRESS NOTE      Date Time: 04/18/21 10:25 AM  Patient Name: Scott Monroe, [name]  Attending Physician: Diona Foley, MD  Consulting Attending Physician: Diona Foley, MD   Admit Date:  04/16/2021  Payor: MEDICARE / Plan: MEDICARE PART A ONLY / Product Type: Medicare /   Orders Placed This Encounter   Procedures    Diet Consistent Carbohydrate and Heart Healthy Na restriction, if any: 2 GM NA; Fluid restriction: 1500 ML FLUID        I personally spent 35 minutes today, exclusive of procedures, providing care for this patient, including preparation, face to face time, EMR documentaton and other services such as review of medical record, diagnostic results, patient education, counseling, coordination of care as specified in the encounter.    Assessment/Plan:   Mr. Scott Bisono. is a 76 y.o. male with past medical history of type 2 diabetes, atrial fibrillation, and hypertension who presented to the hospital on 04/16/2021 with chest pain and shortness of breath, found to have atrial fibrillation with RVR. Endocrinology consulted for management of type 2 diabetes with hyperglycemia.     Uncontrolled type 2 diabetes with hyperglycemia: HbA1c this admission >14%  Patient severely hyperglycemic on admission, in the setting of inability to afford medications and not taking antihyperglycemic agents  Patient with improved glycemic control, has fasting and prandial hyperglycemia  Continue NPH 15 units daily before breakfast (give 50% of dose if NPO)  Increase evening NPH to 15 units (give 50% if NPO)  Increase lispro to 8 units qAC; Hold if not eating/NPO. Reduce dose by 50% if glucose is less than 120 mg/dL  Continue Lispro MDSSI every 4 hours for additional hyperglycemic coverage  Will continue to monitor for hypoglycemia and hyperglycemia and adjust insulin regimen as needed  Patient with HbA1c greater than 14%, so recommend insulin on discharge, along with metformin and glipizide. Given financial  strain, discussed with case management need for insulin coverage on discharge  Please provide referral for transitional care clinic on discharge  BG goal in hospital: 140 to 180 mg/dl  Discharge recommendations as below               Endocrine Recommendations         Home regimen     None         Discharge regimen    Insulin (either NPH and lispro or NPH/regular 70/30 insulin), metformin 1000 mg PO BID and glipizide 5 mg p.o. once daily         Follow up provider    Outpatient endocrinology referral placed, transitional care clinic           I have reviewed the following: blood glucose levels, insulin requirements  I have ordered the following: insulin changes as above    Lind Guest, MD  [facility] Endocrinology  Epic Chat or pager ID: [phone]  On nights, holidays and weekends please page the endocrine consult pager [phone]) for emergencies.     Subjective:    Pt seen at bedside,     Review of Systems:   10 point ROS reviewed and negative, except per subjective/HPI.    Physical Exam:   Temp:  [97.3 F (36.3 C)-97.9 F (36.6 C)] 97.7 F (36.5 C)  Heart Rate:  [73-102] 94  Resp Rate:  [16-18] 17  BP: (106-136)/(55-92) 134/92  Wt Readings from Last 1 Encounters:   04/18/21 71.1 kg (156 lb 11.2 oz)  Body mass index is 24.54 kg/m.     Intake/Output Summary (Last 24 hours) at 04/18/2021 1025  Last data filed at 04/18/2021 0913  Gross per 24 hour   Intake 2361.68 ml   Output 1995 ml   Net 366.68 ml     General: Sleeping in chair, no acute distress  Lungs: normal work of breathing on room air  Abdomen: non-distended  Neuro: no gross motor deficits    Meds:     Scheduled Meds:  Current Facility-Administered Medications   Medication Dose Route Frequency    atorvastatin  80 mg Oral QHS    gabapentin  300 mg Oral Q8H SCH    insulin lispro  8 Units Subcutaneous TID AC    insulin NPH  10 Units Subcutaneous QHS    insulin NPH  15 Units Subcutaneous QAM AC    nicotine  1 patch Transdermal Daily    pantoprazole  40 mg Oral QAM AC     traZODone  50 mg Oral QHS     Continuous Infusions:   dilTIAZem 15 mg/hr (04/18/21 0348)    heparin infusion 25,000 units/500 mL (Cardiac/Low Intensity) 18 Units/kg/hr (04/18/21 0524)     PRN Meds:.acetaminophen, dextrose **OR** dextrose **OR** dextrose **OR** glucagon (rDNA), heparin (porcine), magnesium sulfate, melatonin, naloxone, potassium & sodium phosphates, potassium chloride **AND** potassium chloride    Labs:     Lab Results   Component Value Date    HGBA1C >14.0 (H) 04/17/2021       Whole Blood Glucose POCT   Date/Time Value Ref Range Status   04/18/2021 0807 285 (H) 70 - 100 mg/dL Final   72/53/6644 0347 206 (H) 70 - 100 mg/dL Final   42/59/5638 7564 194 (H) 70 - 100 mg/dL Final   33/29/5188 4166 285 (H) 70 - 100 mg/dL Final   07/15/1599 0932 357 (H) 70 - 100 mg/dL Final       Recent Labs     04/18/21  0404 04/17/21  0148   WBC 7.28 8.47   Hgb 13.5 14.0   Hematocrit 40.2 42.6   Platelets 164 190   MCV 79.0 78.5       Recent Labs     04/18/21  0404 04/17/21  0734 04/17/21  0148   Sodium 132*  --  130*   Potassium 3.8  --  3.7   Chloride 101  --  96*   CO2 21  --  22   BUN 21.0  --  13.0   Creatinine 1.1  --  1.2   Glucose 213*  --  355*   Calcium 8.7  --  9.1   Magnesium 1.8 2.3 1.4*       Recent Labs     04/16/21  1623   AST (SGOT) 23   ALT 26   Alkaline Phosphatase 154*   Protein, Total 6.3   Albumin 3.3*       Recent Labs     04/16/21  2117 04/16/21  1623   PTT 27 29   PT  --  11.6   PT INR  --  1.0         Signed by: Lind Guest, MD

## 2021-04-19 DIAGNOSIS — N401 Enlarged prostate with lower urinary tract symptoms: Secondary | ICD-10-CM | POA: Diagnosis present

## 2021-04-19 DIAGNOSIS — R3914 Feeling of incomplete bladder emptying: Secondary | ICD-10-CM | POA: Diagnosis present

## 2021-04-19 DIAGNOSIS — I4891 Unspecified atrial fibrillation: Secondary | ICD-10-CM

## 2021-04-19 DIAGNOSIS — I5032 Chronic diastolic (congestive) heart failure: Secondary | ICD-10-CM | POA: Diagnosis present

## 2021-04-19 DIAGNOSIS — E1165 Type 2 diabetes mellitus with hyperglycemia: Secondary | ICD-10-CM | POA: Diagnosis present

## 2021-04-19 DIAGNOSIS — I5020 Unspecified systolic (congestive) heart failure: Secondary | ICD-10-CM | POA: Diagnosis present

## 2021-04-19 LAB — CBC AND DIFFERENTIAL
Absolute NRBC: 0 10*3/uL (ref 0.00–0.00)
Basophils Absolute Automated: 0.09 10*3/uL — ABNORMAL HIGH (ref 0.00–0.08)
Basophils Automated: 1.3 %
Eosinophils Absolute Automated: 0.34 10*3/uL (ref 0.00–0.44)
Eosinophils Automated: 4.9 %
Hematocrit: 41 % (ref 37.6–49.6)
Hgb: 13.6 g/dL (ref 12.5–17.1)
Immature Granulocytes Absolute: 0.03 10*3/uL (ref 0.00–0.07)
Immature Granulocytes: 0.4 %
Instrument Absolute Neutrophil Count: 3.53 10*3/uL (ref 1.10–6.33)
Lymphocytes Absolute Automated: 2.26 10*3/uL (ref 0.42–3.22)
Lymphocytes Automated: 32.9 %
MCH: 26.4 pg (ref 25.1–33.5)
MCHC: 33.2 g/dL (ref 31.5–35.8)
MCV: 79.6 fL (ref 78.0–96.0)
MPV: 10.1 fL (ref 8.9–12.5)
Monocytes Absolute Automated: 0.62 10*3/uL (ref 0.21–0.85)
Monocytes: 9 %
Neutrophils Absolute: 3.53 10*3/uL (ref 1.10–6.33)
Neutrophils: 51.5 %
Nucleated RBC: 0 /100 WBC (ref 0.0–0.0)
Platelets: 177 10*3/uL (ref 142–346)
RBC: 5.15 10*6/uL (ref 4.20–5.90)
RDW: 15 % (ref 11–15)
WBC: 6.87 10*3/uL (ref 3.10–9.50)

## 2021-04-19 LAB — URINALYSIS REFLEX TO MICROSCOPIC EXAM - REFLEX TO CULTURE
Bilirubin, UA: NEGATIVE
Blood, UA: NEGATIVE
Ketones UA: NEGATIVE
Leukocyte Esterase, UA: NEGATIVE
Nitrite, UA: NEGATIVE
Protein, UR: NEGATIVE
Specific Gravity UA: 1.007 (ref 1.001–1.035)
Urine pH: 5 (ref 5.0–8.0)
Urobilinogen, UA: NORMAL mg/dL (ref 0.2–2.0)

## 2021-04-19 LAB — BASIC METABOLIC PANEL
Anion Gap: 8 (ref 5.0–15.0)
BUN: 22 mg/dL (ref 9.0–28.0)
CO2: 22 mEq/L (ref 17–29)
Calcium: 8.5 mg/dL (ref 7.9–10.2)
Chloride: 105 mEq/L (ref 99–111)
Creatinine: 1.2 mg/dL (ref 0.5–1.5)
Glucose: 178 mg/dL — ABNORMAL HIGH (ref 70–100)
Potassium: 4.2 mEq/L (ref 3.5–5.3)
Sodium: 135 mEq/L (ref 135–145)

## 2021-04-19 LAB — GLUCOSE WHOLE BLOOD - POCT
Whole Blood Glucose POCT: 230 mg/dL — ABNORMAL HIGH (ref 70–100)
Whole Blood Glucose POCT: 167 mg/dL — ABNORMAL HIGH (ref 70–100)
Whole Blood Glucose POCT: 232 mg/dL — ABNORMAL HIGH (ref 70–100)
Whole Blood Glucose POCT: 213 mg/dL — ABNORMAL HIGH (ref 70–100)
Whole Blood Glucose POCT: 168 mg/dL — ABNORMAL HIGH (ref 70–100)

## 2021-04-19 LAB — GFR: EGFR: 59.3

## 2021-04-19 LAB — MAGNESIUM: Magnesium: 2 mg/dL (ref 1.6–2.6)

## 2021-04-19 LAB — ANTI-XA,UFH: Anti-Xa, UFH: >2 IU/mL

## 2021-04-19 MED ORDER — ATORVASTATIN CALCIUM 80 MG PO TABS: 80.0000 mg | ORAL_TABLET | Freq: Every evening | ORAL | 0 refills | Status: AC

## 2021-04-19 MED ORDER — TAMSULOSIN HCL 0.4 MG PO CAPS
0.4000 mg | ORAL_CAPSULE | Freq: Every day | ORAL | 0 refills | Status: DC
Start: 2021-04-20 — End: 2023-12-04

## 2021-04-19 MED ORDER — VALSARTAN 40 MG PO TABS
40.0000 mg | ORAL_TABLET | Freq: Every day | ORAL | 0 refills | Status: DC
Start: 2021-04-20 — End: 2023-11-14

## 2021-04-19 MED ORDER — APIXABAN 5 MG PO TABS
5.0000 mg | ORAL_TABLET | Freq: Two times a day (BID) | ORAL | 0 refills | Status: DC
Start: 2021-04-25 — End: 2021-05-25

## 2021-04-19 MED ORDER — INSULIN LISPRO 100 UNIT/ML SOLN (WRAP)
10.0000 [IU] | Freq: Three times a day (TID) | Status: DC
Start: 2021-04-19 — End: 2021-04-19
  Administered 2021-04-19 (×2): 10 [IU] via SUBCUTANEOUS
  Filled 2021-04-19 (×2): qty 30

## 2021-04-19 MED ORDER — PANTOPRAZOLE SODIUM 40 MG PO TBEC
40.0000 mg | DELAYED_RELEASE_TABLET | Freq: Every morning | ORAL | 0 refills | Status: DC
Start: 2021-04-20 — End: 2023-12-04

## 2021-04-19 MED ORDER — FUROSEMIDE 40 MG PO TABS
20.0000 mg | ORAL_TABLET | ORAL | 0 refills | Status: DC | PRN
Start: 2021-04-19 — End: 2023-12-04

## 2021-04-19 MED ORDER — SENNOSIDES-DOCUSATE SODIUM 8.6-50 MG PO TABS
2.0000 | ORAL_TABLET | Freq: Every evening | ORAL | Status: DC
Start: 2021-04-19 — End: 2021-04-19
  Administered 2021-04-19: 2 via ORAL
  Filled 2021-04-19: qty 2

## 2021-04-19 MED ORDER — INSULIN NPH ISOPHANE & REGULAR (70-30) 100 UNIT/ML SC SUPN
PEN_INJECTOR | SUBCUTANEOUS | 0 refills | Status: DC
Start: 2021-04-19 — End: 2023-12-04

## 2021-04-19 MED ORDER — BD PEN NEEDLE MICRO U/F 32G X 6 MM MISC
0 refills | Status: DC
Start: 2021-04-19 — End: 2023-12-04

## 2021-04-19 MED ORDER — FUROSEMIDE 20 MG PO TABS
20.0000 mg | ORAL_TABLET | Freq: Once | ORAL | Status: DC
Start: 2021-04-19 — End: 2021-04-19
  Filled 2021-04-19: qty 1

## 2021-04-19 MED ORDER — TAMSULOSIN HCL 0.4 MG PO CAPS
0.4000 mg | ORAL_CAPSULE | Freq: Every day | ORAL | Status: DC
Start: 2021-04-19 — End: 2021-04-19
  Administered 2021-04-19: 0.4 mg via ORAL
  Filled 2021-04-19: qty 1

## 2021-04-19 MED ORDER — TRAZODONE HCL 50 MG PO TABS
50.0000 mg | ORAL_TABLET | Freq: Every evening | ORAL | 0 refills | Status: DC
Start: 2021-04-19 — End: 2023-12-04

## 2021-04-19 MED ORDER — TAMSULOSIN HCL 0.4 MG PO CAPS
0.4000 mg | ORAL_CAPSULE | Freq: Every day | ORAL | Status: DC
Start: 2021-04-19 — End: 2021-04-19

## 2021-04-19 MED ORDER — METOPROLOL SUCCINATE ER 25 MG PO TB24
25.0000 mg | ORAL_TABLET | Freq: Two times a day (BID) | ORAL | 0 refills | Status: DC
Start: 2021-04-19 — End: 2021-04-21

## 2021-04-19 MED ORDER — GABAPENTIN 300 MG PO CAPS
300.0000 mg | ORAL_CAPSULE | Freq: Three times a day (TID) | ORAL | 0 refills | Status: DC
Start: 2021-04-19 — End: 2023-11-14

## 2021-04-19 MED ORDER — APIXABAN 5 MG PO TABS
10.0000 mg | ORAL_TABLET | Freq: Two times a day (BID) | ORAL | 0 refills | Status: DC
Start: 2021-04-19 — End: 2021-04-23

## 2021-04-19 MED ORDER — INSULIN NPH (HUMAN) (ISOPHANE) 100 UNIT/ML SC SUSP
12.0000 [IU] | Freq: Every evening | SUBCUTANEOUS | Status: DC
Start: 2021-04-19 — End: 2021-04-19

## 2021-04-19 NOTE — Discharge Instr - AVS First Page (Addendum)
Reason for your Hospital Admission:    You were hospitalized with shortness of breath. Some of this was due to your weakened heart. Some of this was due to your untreated diabetes. Both illnesses are better now that you are back on the right medications. Unfortunately, due to your weak heart, you did develop a blood clot in the heart and will need to be on a blood thinner until your cardiologist says it is safe to stop the medication. If you stop the medication too soon, the clot can sometimes travel and cause blockages in the brain (stroke) or lungs (pulmonary embolism). Both of these are very dangerous so please do not run out of your medications without contacting your doctor several weeks in advance for refills and patient assistance programs.    You can get health coverage through the [place] system but you will need to call them to make an appointment with a new PCP. In the meanwhile, we have a [facility] where you can be seen after discharge. Do see them within the next 5-7 days latest. Better to follow up early and address problems while they are small.    Be sure to follow-up with your cardiologist.  On an ongoing basis, call the cardiology office EARLY if you have any Yellow Zone Symptoms  Take your medicines the way you are instructed--OR--ask the doctor if you have questions or problems with any of the medicines.  Measure and log your blood pressure and heart rate--be sure you know at what values you should not take a medicine and/or when to call the cardiology office.  When you get home, start/continue weighing yourself every morning, and write the weight down in a log.  Please call the doctor's office and ask to speak with the nurse if you gain 2 pounds in one day or 5 pounds in one week.  Limit your salt--in the foods you cook and in the foods you eat.  Limit your fluids--all the fluids you drink, whether tea, or juice or soups, and such--to 1.5 liters per day  Be as active as you safely  can.       ==================================================================================================================================================      ENDOCRINOLOGY HOSPITAL DISCHARGE RECOMMENDATIONS  Insulin Instructions Handout         Your more recent Hemoglobin A1c:   Hemoglobin A1C (%)   Date Value   04/17/2021 >14.0 (H)   08/20/2020 11.7 (H)      Hemoglobin A1C Diagnosis   <5.7% Normal   5.7-6.4% Prediabetes   ?6.5% Diabetes          Check your blood sugar Your target BG numbers     [x]      Fasting/before breakfast      80 - 130 mg/dL       [x]     Before meals     100-150 mg/dL         DIABETES Rx YOUR DOCTOR RECOMMENDED FOR YOU:     Breakfast Lunch Dinner Bedtime   Pre-mixed insulin:    [x]  70/30          30 units             20 units        Basal insulin  Basal insulin provides a constant background level of insulin in the bloodstream throughout the day and night, which controls blood glucose levels before dinner and overnight    NPH/regular 70/30 insulin    Start taking NPH/regular 70/30 insulin  __30__units at breakfast time  __20__units at dinner time or bedtime    Your fasting blood glucose is the first blood glucose measurement before breakfast.    If your fasting blood sugar is above your target number for 2 days in a row, make the following changes:      Before breakfast if more than 160     increase by the insulin dose before dinner by 4 units       Before dinner if more than 160 I  increase by the insulin dose before breakfast by 4 units       Continue increasing insulin every 2 days until your blood sugar at that time of day is in your target range.    If your fasting glucose is low, without any explanation, e.g. You ate less or exercised more, which adjusts your insulin as follows:      Before breakfast if less than 80      reduce the bedtime insulin dose by 4 units     Antes de la cena si menos de 80   reduce the breakfast insulin dose by  4 units         LOW  GLUCOSE/HYPOGLYCEMIA:  Blood sugar (glucose) below 70 mg/dL is considered low. Please check if you think you have a low blood sugar level. If unable to check, please treat it. Untreated hypoglycemia can be dangerous which is why it is important to know what to do and how to treat immediately.    "15-15 Rule": If you have a low sugar between 55-69 mg/dL, have 15 grams of carbs. Check your glucose levels after 15 minutes. Repeat if you're still below your target range by having another serving. Repeat until you are in target range. Once in range, have a nutritious meal or snack to ensure it does not get too low again and follow up with your primary care provider.     These items have about 15 grams of carbs:  4 ounces ( cup) of juice or regular soda.  1 tablespoon of sugar, honey, or syrup.  Hard candies, jellybeans, or gumdrops (see food label for how much to eat).  3-4 glucose tablets (follow instructions).  1 dose of glucose gel (usually 1 tube; follow instructions).    Tips to keep in mind:  It takes time for blood sugar to rise after eating. Give some time for treatment to work. Following the 15-15 rule helps.  You should avoid eating a carb with lots of fiber, such as beans or lentils, or a carb that also has fat, such as chocolate. Fiber and fat slow down how fast you absorb sugar.  Check your blood sugar often when lows are more likely, such as when the weather is hot or when you travel.     How To Treat Low Blood Sugar (Hypoglycemia)  Diabetes  CDC; [email or web address]      How to check your blood sugars:      Check your blood sugars 4x/day (before breakfast, before lunch, before dinner, and before bedtime). Bring this log to your doctor:  DATE BLOOD SUGAR   BEFORE   BREAKFAST INSULIN GIVEN/NOTES BLOOD SUGAR   BEFORE LUNCH  INSULIN GIVEN/NOTES BLOOD SUGAR   BEFORE DINNER INSULIN GIVEN/NOTES BLOOD SUGAR   BEFORE BEDTIME INSULIN GIVEN/NOTES  HOW TO ADMINISTER INSULIN VIA INSULIN PEN:             HOW TO ADMINISTER INSULIN VIA INSULIN SYRINGE:      Diabetic Diet Recommendations:  The American Diabetes Association offers a simple method of meal planning. In essence, it focuses on eating more vegetables. Follow these steps when preparing your plate:    Fill half of your plate with nonstarchy vegetables, such as spinach, carrots and tomatoes.  Fill a quarter of your plate with a protein, such as tuna, lean pork or chicken.  Fill the last quarter with a whole-grain item, such as brown rice, or a starchy vegetable, such as green peas.  Include "good" fats such as nuts or avocados in small amounts.  Add a serving of fruit or dairy and a drink of water or unsweetened tea or coffee.    Most adults with diabetes aim for 45-60 grams of carbs per meal and 15-20 grams per snack.   Please note that each food listed below contains 1 serving (15 grams) of carbohydrate:    Starches:  1 slice bread (1 ounce)   1 tortilla (6-inch size)    large bagel (1 ounce)   2 taco shells (5-inch size)    hamburger or hot dog bun ( ounce)    cup ready-to-eat unsweetened cereal    cup cooked cereal   1 cup broth-based soup   4 to 6 small crackers   ? cup pasta or rice (cooked)    cup beans, peas, corn, sweet potatoes, winter squash, or mashed or boiled potatoes (cooked)    large baked potato (3 ounces)    ounce pretzels, potato chips, or tortilla chips   3 cups popcorn (popped)  Fruit:  1 small fresh fruit ( to 1 cup)    cup canned or frozen fruit   2 tablespoons dried fruit (blueberries, cherries, cranberries, mixed fruit,  raisins)   17 small grapes (3 ounces)   1 cup melon or berries    cup unsweetened fruit juice  Snack ideas (select one food option for a protein and combine it with one carbohydrate serving, see examples below):    Proteins and healthy fats:  1 tablespoon of peanut butter, almonds or cashews.  1-2 ounces of chicken salad  1-2 ounces peeled chicken  1-2 ounces low-sodium [place]  2 ounces of tuna  1 boiled egg  4 ounces low-fat cottage cheese  1 string of cheese  1 slice of Swiss cheese  10 untreated almonds or peanuts  1/4 avocado  2 tablespoons of hummus    Carbohydrates:  1 small apple  1 small sweet potato  1 small banana  1/2 cup plain cooked oatmeal  1 small pear  1/2 cup strawberries  1/2 cup blueberries  3 graham crackers  6 whole wheat crackers  1 slice of whole wheat bread   Sweets and Desserts (<1x/week):  2-inch square cake (unfrosted)   2 small cookies (? ounce)    cup ice cream or frozen yogurt    cup sherbet or sorbet   1 tablespoon syrup, jam, jelly, table sugar, or honey   2 tablespoons light syrup  Milk:  1 cup fat-free or reduced-fat milk   1 cup soy milk   ? cup (6 ounces) nonfat yogurt sweetened with sugar-free sweetener       Note:  Please avoid sweets, desserts, sweetened beverages, soda, and juice.  Note that the following foods are examples of foods free of  carbohydrates and do not need to be avoided: non-starchy vegetables (for example: Spinach, cucumbers, tomatoes, lettuce, broccoli, cauliflower, green beans, snap peas, zucchini), and protein foods (nuts, seeds, chicken, fish, beef, cheese, eggs).  Please select unsalted, lean, and low fat protein foods.      Community Endocrinology Provider & Practice List:    [facility]    Practice Location Provider Telephone  Address   [facility]   Dr. Marzetta Board [place]  Dr. Deloria Lair  Dr. Doneen Poisson  Dr. Lavone Nian  Dr. Chestine Spore   [phone] [address]  [place], [place] [address]   [facility]* Dr. Tanya Nones   Dr. Charletta Cousin [phone]   [address]   [place], [place] [address]         *Please note: Diabetes education is available at these locations

## 2021-04-19 NOTE — Progress Notes (Signed)
04/19/21 1620   Discharge Disposition   Patient preference/choice provided? N/A   Physical Discharge Disposition Home   Mode of Transportation   (daughter to transport)   Patient/Family/POA notified of transfer plan Yes   Patient agreeable to discharge plan/expected d/c date? Yes   Family/POA agreeable to discharge plan/expected d/c date? Yes   Bedside nurse notified of transport plan? Yes   CM Interventions   Follow up appointment scheduled? No   Reason no follow up scheduled? Family to schedule   Notified MD? Yes   Referral made for home health RN visit? Does not meet home bound criteria   Multidisciplinary rounds/family meeting before d/c? Yes   Medicare Checklist   Is this a Medicare patient? Yes   Patient received 1st IMM Letter? Yes   If LOS 3 days or greater, did patient received 2nd IMM Letter? Yes   Date of 2nd IMM Letter 04/19/21   Time of 2nd IMM letter 1400     Per medical team, pt is ready for d/c home today. Pt concerned about RX coverage/cost. CM provided pt with a copy of his [facility] insurance for future reference. Pt is working to get a [place] provider for future appointments now that he knows he has [employer] in place. [facility] was able to use his insurance to pay for medications. No further CM needs identified. Pt in agreement with plan.    Carolanne Grumbling, LCSW, CCM  [facility]  Social Work Care Manager II  [address]  [place], [place] [address]  Phone: [phone]

## 2021-04-19 NOTE — Progress Notes (Signed)
Situation:   April 19, 2021   Patient information: Scott Sours., 76 y.o., male,   Hospital day 3  Admission Diagnosis: Atrial fibrillation with RVR [I48.91]  LACE SCORE:  7    Pt admitted with edema, chest pain and hyperglycemia, found to have Afib with RVR. 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 Pt medications changed to dilt and eliquis, valsartan added. Plan to start GCMT and monitor response. Pt has [employer] (fee based program) with medication coverage. CM provided pt with documentation informing him of this as he states he was not aware. CM to request [facility] appt for pt at d/c.       Dispo: Home with daughter, Daughter will transport.    CM will continue to monitor patient's progression of care, d/c needs and possible barriers to discharge.      Carolanne Grumbling, LCSW  Social Work Case Manager  [facility]

## 2021-04-19 NOTE — Discharge Summary (Signed)
PROGRESS NOTE/DISCHARGE SUMMARY      Date Time: 04/19/21 4:21 PM  Patient Name: Scott Monroe, Scott Monroe.  Attending Physician: Diona Foley, MD  Primary Care Physician: Marisa Sprinkles, MD    Date of Admission:   04/16/2021    Date of Discharge:   04/19/21    Discharge Dx:     Afib with RVR    Patient Active Problem List    Diagnosis Date Noted    HFrEF (heart failure with reduced ejection fraction) 04/19/2021    Uncontrolled type 2 diabetes mellitus with hyperglycemia 04/19/2021    Benign prostatic hyperplasia with incomplete bladder emptying 04/19/2021    Primary osteoarthritis of left knee 01/25/2021    Atrial fibrillation 08/19/2020      Consultations:   Treatment Team: Attending Provider: Diona Foley, MD; Registered Nurse: Jennette Banker, RN; Consulting Physician: Baruch Gouty, MD PhD; Case Manager: Kandee Keen; Pharmacist: Hipolito Bayley, [facility]     Procedures/Radiology performed:     Echo Results       Procedure Component Value Units Date/Time    Echocardiogram Adult Complete W Clr/ Dopp Waveform [[id number]] Collected: 04/18/21 1203     Updated: 04/18/21 1357     BP Mod LV Ejection Fraction 29.8     IVS Diastolic Thickness (2D) 1.2     LVID diastole (2D) 5.1     LVID systole (2D) 4.1     LA Dimension (2D) 4.5     AV Mean Gradient 6     AV Mean Gradient 5     AV Mean Gradient 3     AV Peak Velocity 165     AV Peak Velocity 153     AV Peak Velocity 125     AV Area (Cont Eq VTI) 1.82     AV Area (Cont Eq VTI) 1.64     Ao Root Diameter (2D) 3.3     Prox Ascending Aorta Diameter 3.3     RV Basal Diastolic Dimension 3.9     RV Systolic Pressure 47.902     RV Systolic Pressure 48     TAPSE 1.01     TAPSE 0.968     TAPSE 1.24     TAPSE 0.922     MV Area (PHT) 4.36     LA Volume Index (BP A-L) 0.047     MV E/e' (Average) 22.516     Site RV Size (AS) normal in size     RV Function Moderately decreased     Site RA Size (AS) mildly dilated     MV Regurgitation Severity mild     TV Regurgitation Severity mild      AV Regurgitation Severity no     Aortic Stenosis Severity no     Site PV Stenosis (AS) no Doppler evidence of pulmonic valve sclerosis or stenosis     PV Regurgitation Severity mild     Atrial Septum Findings No evidence of interatrial shunt by color Doppler.     Aortic Valve Findings The aortic valve is tricuspid and sclerotic.     Aortic Valve Findings There is no aortic stenosis.     Aortic Valve Findings There is no aortic regurgitation.     Pulmonary Valve Findings The pulmonic valve is structurally normal.     Pulmonary Valve Findings There is mild pulmonic regurgitation.     Mitral Valve Findings The mitral valve is thickened and calcified.     Mitral Valve Findings There is mild mitral  regurgitation.     Tricuspid Valve Findings The tricuspid valve is structurally normal.     Tricuspid Valve Findings There is mild tricuspid regurgitation.     Tricuspid Valve Findings Moderate pulmonary hypertension with estimated right ventricular systolic pressure of  48 mmHg.    Narrative:      Name:     Scott Monroe  Age:     76 years  DOB:     [birth year]  Gender:     Male  MRN:     [id number]  Wt:     157 lb  BSA:     1.84 m2  Systolic BP:     664 mmHg  Diastolic BP:     92 mmHg  Technical Quality:     Adequate  Exam Date/Time:     04/18/2021 12:03 PM  Study Site:     [facility]  Exam Type:     ECHOCARDIOGRAM ADULT COMPLETE W CLR/ DOPP WAVEFORM    Study Info  Indications      Shortness of breath with high cardiac risk -  Procedure    Complete two-dimensional, color flow and spectral Doppler transthoracic  echocardiogram is performed. Three dimensional echocardiographic imaging is  performed during the transthoracic echocardiogram.    Staff  Sonographer:     Ali Al-Qaraghuli RDCS, RVT  Ordering Physician:     Monica Becton    67 in      History/Risk Factors  Hypertension:     Yes  Hyperlipidemia:     Yes  Tobacco Use:     Yes  Arrhythmias:     Atrial fibrillation    Prior Cardiac Studies  Date of Prior Echo:      08/20/2020      Summary    * Left ventricular systolic function is severely decreased with an ejection  fraction by Biplane Method of Discs of  30 %.    * There is mild concentric left ventricular hypertrophy.    * Moderate global hypokinesis without regional variation.    * Ungraded diastolic filling parameters in atrial fibrillation.    * LV apical mass (thrombus), measuring 1.1 x 0.7 cm.    * The right ventricular cavity size is normal in size.    * Moderate to severely decreased right ventricular systolic function.    * TAPSE = 0.9 cm, S' = 9 cm/s, fractional area change = 17 %, 3D EF 17%.    * The left atrium is moderately dilated.    * The right atrium is mildly dilated.    * The mitral valve is thickened and calcified.    * There is mild mitral regurgitation.    * Moderate pulmonary hypertension with estimated right ventricular systolic  pressure of  48 mmHg.    * The inferior vena cava is dilated with poor inspiratory collapse,  consistent with elevated right atrial pressure.    * Trivial to small circumferential pericardial effusion visualized.    * Clinical service was notified of results by securechat.    * Since prior echo 08/20/20 reviewed, EF 63 to 30%, RV dysFx new, PHT new,  effusion new.      Findings  Left Ventricle    The left ventricle is normal in size. There is mild concentric left  ventricular hypertrophy. Left ventricular systolic function is severely  decreased with an ejection fraction by Biplane Method of Discs of  30 %.  Moderate global hypokinesis without regional variation. Ungraded diastolic  filling parameters in atrial fibrillation. LV apical mass (thrombus),  measuring 1.1 x 0.7 cm.  Right Ventricle    The right ventricular cavity size is normal in size. Moderate to severely  decreased right ventricular systolic function. TAPSE = 0.9 cm, S' = 9 cm/s,  fractional area change = 17 %, 3D EF 17%.      Left Atrium    The left atrium is moderately dilated.    Right Atrium    The right atrium  is mildly dilated.    Atrial Septum    No evidence of interatrial shunt by color Doppler.      Aortic Valve    The aortic valve is tricuspid and sclerotic. There is no aortic stenosis.  There is no aortic regurgitation.    Pulmonary Valve    The pulmonic valve is structurally normal. There is mild pulmonic  regurgitation.    Mitral Valve    The mitral valve is thickened and calcified. There is mild mitral  regurgitation.    Tricuspid Valve    The tricuspid valve is structurally normal. There is mild tricuspid  regurgitation. Moderate pulmonary hypertension with estimated right  ventricular systolic pressure of  48 mmHg.      Aorta    The aortic root is normal in size. The ascending aorta is normal in size.    Inferior Vena Cava    The inferior vena cava is dilated with poor inspiratory collapse, consistent  with elevated right atrial pressure.    Pericardium / Pleural Effusion    Trivial to small circumferential pericardial effusion visualized. No  echocardiographic evidence of cardiac tamponade.      Measurements  2D Measurements  ----------------------------------------------------------------------  Name                                 Value        Normal  ----------------------------------------------------------------------    Parasternal 2D  ----------------------------------------------------------------------   IVS Diastolic Thickness  (2D)                               1.20 cm     0.60-1.00   LVID Diastole (2D)                 5.10 cm     4.20-5.80    LVIW Diastolic Thickness  (2D)                               1.20 cm     0.60-1.05   LVID Systole (2D)                  4.10 cm     2.50-4.00   LVOT Diameter                      2.00 cm                 LA Dimension (2D)                  4.50 cm     3.00-4.10   Ao Root Diameter (2D)              3.30 cm     2.70-3.70   Prox Asc Ao Diameter  3.30 cm     2.60-3.40     LV Ejection Fraction  2D  ----------------------------------------------------------------------  LV EF (BP MOD)                        30 %         52-72     Apical 2D Dimensions  ----------------------------------------------------------------------   RV Basal Diastolic  Dimension                          3.90 cm     2.50-4.10   LA Volume Index (BP A-L)        46.77 ml/m2       <=34.00   RA Area (4C)                     20.70 cm2       <=18.00  M-mode Measurements  ----------------------------------------------------------------------  Name                                 Value        Normal  ----------------------------------------------------------------------    M-Mode  ----------------------------------------------------------------------  TAPSE                              0.92 cm        >=1.60  LVOT/Aortic Valve Doppler Measurements  ----------------------------------------------------------------------  Name                                 Value        Normal  ----------------------------------------------------------------------    LVOT Doppler  ----------------------------------------------------------------------  LVOT Peak Velocity                1.01 m/s                   AoV Doppler  ----------------------------------------------------------------------  AV Peak Velocity                  1.48 m/s                 AV Peak Gradient                   11 mmHg                 AV Mean Gradient                    5 mmHg           <20   AV Area (Cont Eq VTI)             1.73 cm2        >=3.00   AV V1/V2 Ratio                        0.69  RVOT/Pulmonic Valve Doppler Measurements  ----------------------------------------------------------------------  Name                                 Value        Normal  ----------------------------------------------------------------------    PV Doppler  ----------------------------------------------------------------------  PV Peak Velocity  0.66 m/s  Mitral Valve  Measurements  ----------------------------------------------------------------------  Name                                 Value        Normal  ----------------------------------------------------------------------    MV Doppler  ----------------------------------------------------------------------  MV E Peak Velocity                1.23 m/s                   MV Annular TDI  ----------------------------------------------------------------------  MV Septal e' Velocity             0.05 m/s        >=0.08   MV E/e' (Septal)                     25.83        <=8.00   MV Lateral e' Velocity            0.06 m/s        >=0.10   MV E/e' (Lateral)                    19.20        <=8.00  Tricuspid Valve Measurements  ----------------------------------------------------------------------  Name                                 Value        Normal  ----------------------------------------------------------------------    TV Regurgitation Doppler  ----------------------------------------------------------------------  TR Peak Velocity                  2.87 m/s                 RA Pressure                        15 mmHg           <=3   RV Systolic Pressure               48 mmHg           <36  Aorta / Venous Measurements  ----------------------------------------------------------------------  Name                                 Value        Normal  ----------------------------------------------------------------------    IVC/SVC  ----------------------------------------------------------------------  IVC Diameter (Exp 2D)              2.40 cm        <=2.10      Report Signatures  Finalized by Deliah Goody  MD on 04/18/2021 01:12 PM  Preliminary by Nona Dell  RDCS, RVT on 04/18/2021 12:53 PM          US Venous Duplex Doppler Leg Bilateral    Result Date: 04/17/2021   No sonographic evidence for right or left lower extremity deep venous thrombosis. Mills Koller, MD 04/17/2021 9:22 AM    XR Chest  AP Portable    Result Date:  04/16/2021   Cardiomegaly. No pulmonary edema. Jasmine December D'Heureux, MD 04/16/2021 6:35 PM       Results       Procedure Component Value Units Date/Time  Anti-Xa, UFH [[id number]]  (Abnormal) Collected: 04/19/21 1338     Updated: 04/19/21 1418     Anti-Xa, UFH >2.00 IU/mL     Narrative:      Every 8 hours after initiation and every rate change. After  two subsequent values within goal range, change frequency to  IHS daily at 0400 until heparin is discontinued.  If heparin infusion is on MAR hold do not draw this lab.    Urinalysis Reflex to Microscopic Exam- Reflex to Culture [[id number]]  (Abnormal) Collected: 04/19/21 1306    Specimen: Urine, Clean Catch Updated: 04/19/21 1332     Urine Type Urine, Clean Ca     Color, UA Colorless     Clarity, UA Clear     Specific Gravity UA 1.007     Urine pH 5.0     Leukocyte Esterase, UA Negative     Nitrite, UA Negative     Protein, UR Negative     Glucose, UA 50= Trace     Ketones UA Negative     Urobilinogen, UA Normal mg/dL      Bilirubin, UA Negative     Blood, UA Negative    Glucose Whole Blood - POCT [[id number]]  (Abnormal) Collected: 04/19/21 1157     Updated: 04/19/21 1209     Whole Blood Glucose POCT 213 mg/dL     Glucose Whole Blood - POCT [[id number]]  (Abnormal) Collected: 04/19/21 0736     Updated: 04/19/21 0756     Whole Blood Glucose POCT 232 mg/dL     Basic Metabolic Panel [[id number]]  (Abnormal) Collected: 04/19/21 0417    Specimen: Blood Updated: 04/19/21 0523     Glucose 178 mg/dL      BUN 66.4 mg/dL      Creatinine 1.2 mg/dL      Calcium 8.5 mg/dL      Sodium 403 mEq/L      Potassium 4.2 mEq/L      Chloride 105 mEq/L      CO2 22 mEq/L      Anion Gap 8.0    Magnesium [[id number]] Collected: 04/19/21 0417    Specimen: Blood Updated: 04/19/21 0523     Magnesium 2.0 mg/dL     GFR [[id number]] Collected: 04/19/21 0417     Updated: 04/19/21 0523     EGFR 59.3       CBC and differential [[id number]]  (Abnormal) Collected: 04/19/21 0417    Specimen: Blood Updated: 04/19/21  0505     WBC 6.87 x10 3/uL      Hgb 13.6 g/dL      Hematocrit 63.0 %      Platelets 177 x10 3/uL      RBC 5.15 x10 6/uL      MCV 79.6 fL      MCH 26.4 pg      MCHC 33.2 g/dL      RDW 15 %      MPV 10.1 fL      Instrument Absolute Neutrophil Count 3.53 x10 3/uL      Neutrophils 51.5 %      Lymphocytes Automated 32.9 %      Monocytes 9.0 %      Eosinophils Automated 4.9 %      Basophils Automated 1.3 %      Immature Granulocytes 0.4 %      Nucleated RBC 0.0 /100 WBC      Neutrophils Absolute 3.53 x10 3/uL      Lymphocytes Absolute Automated 2.26 x10  3/uL      Monocytes Absolute Automated 0.62 x10 3/uL      Eosinophils Absolute Automated 0.34 x10 3/uL      Basophils Absolute Automated 0.09 x10 3/uL      Immature Granulocytes Absolute 0.03 x10 3/uL      Absolute NRBC 0.00 x10 3/uL     Glucose Whole Blood - POCT [[id number]][[id number]]  (Abnormal) Collected: 04/19/21 0408     Updated: 04/19/21 0410     Whole Blood Glucose POCT 167 mg/dL     Glucose Whole Blood - POCT [[id number]][[id number]]  (Abnormal) Collected: 04/19/21 0115     Updated: 04/19/21 0118     Whole Blood Glucose POCT 230 mg/dL     Glucose Whole Blood - POCT [[id number]][[id number]]  (Abnormal) Collected: 04/18/21 2226     Updated: 04/18/21 2236     Whole Blood Glucose POCT 162 mg/dL     Glucose Whole Blood - POCT [[id number]][[id number]]  (Abnormal) Collected: 04/18/21 1957     Updated: 04/18/21 2001     Whole Blood Glucose POCT 180 mg/dL     Glucose Whole Blood - POCT [[id number]][[id number]]  (Abnormal) Collected: 04/18/21 1701     Updated: 04/18/21 1704     Whole Blood Glucose POCT 208 mg/dL     Glucose Whole Blood - POCT [[id number]][[id number]]  (Abnormal) Collected: 04/18/21 1214     Updated: 04/18/21 1223     Whole Blood Glucose POCT 291 mg/dL     Glucose Whole Blood - POCT [[id number]][[id number]]  (Abnormal) Collected: 04/18/21 0807     Updated: 04/18/21 0825     Whole Blood Glucose POCT 285 mg/dL     Basic Metabolic Panel [[id number]][[id number]]  (Abnormal) Collected: 04/18/21 0404    Specimen: Blood Updated: 04/18/21 0448     Glucose 213 mg/dL       BUN 75.621.0 mg/dL      Creatinine 1.1 mg/dL      Calcium 8.7 mg/dL      Sodium 433132 mEq/L      Potassium 3.8 mEq/L      Chloride 101 mEq/L      CO2 21 mEq/L      Anion Gap 10.0    Magnesium [[id number]][[id number]] Collected: 04/18/21 0404    Specimen: Blood Updated: 04/18/21 0448     Magnesium 1.8 mg/dL     GFR [[id number]][[id number]] Collected: 04/18/21 0404     Updated: 04/18/21 0448     EGFR >60.0       Anti-Xa, UFH [[id number]][[id number]] Collected: 04/18/21 0404     Updated: 04/18/21 0435     Anti-Xa, UFH 0.29 IU/mL     Narrative:      Every 8 hours after initiation and every rate change. After  two subsequent values within goal range, change frequency to  IHS daily at 0400 until heparin is discontinued.  If heparin infusion is on MAR hold do not draw this lab.    CBC and differential [[id number]][[id number]] Collected: 04/18/21 0404    Specimen: Blood Updated: 04/18/21 0431     WBC 7.28 x10 3/uL      Hgb 13.5 g/dL      Hematocrit 62.340.2 %      Platelets 164 x10 3/uL      RBC 5.09 x10 6/uL      MCV 79.0 fL      MCH 26.5 pg      MCHC 33.6 g/dL      RDW 15 %      MPV 10.0 fL  Instrument Absolute Neutrophil Count 3.96 x10 3/uL      Neutrophils 54.4 %      Lymphocytes Automated 30.2 %      Monocytes 8.7 %      Eosinophils Automated 5.1 %      Basophils Automated 1.1 %      Immature Granulocytes 0.5 %      Nucleated RBC 0.0 /100 WBC      Neutrophils Absolute 3.96 x10 3/uL      Lymphocytes Absolute Automated 2.20 x10 3/uL      Monocytes Absolute Automated 0.63 x10 3/uL      Eosinophils Absolute Automated 0.37 x10 3/uL      Basophils Absolute Automated 0.08 x10 3/uL      Immature Granulocytes Absolute 0.04 x10 3/uL      Absolute NRBC 0.00 x10 3/uL     Glucose Whole Blood - POCT [[id number]]  (Abnormal) Collected: 04/18/21 0408     Updated: 04/18/21 0410     Whole Blood Glucose POCT 206 mg/dL     Glucose Whole Blood - POCT [[id number]]  (Abnormal) Collected: 04/18/21 0031     Updated: 04/18/21 0035     Whole Blood Glucose POCT 194 mg/dL     Anti-Xa, UFH [[id number]]  Collected: 04/17/21 2013     Updated: 04/17/21 2058     Anti-Xa, UFH 0.19 IU/mL     Narrative:      Every 8 hours after initiation and every rate change. After  two subsequent values within goal range, change frequency to  IHS daily at 0400 until heparin is discontinued.  If heparin infusion is on MAR hold do not draw this lab.    IRON PROFILE [[id number]] Collected: 04/17/21 0148     Updated: 04/17/21 2052     Iron 47 ug/dL      UIBC 387 ug/dL      TIBC 564 ug/dL      Iron Saturation 16 %     Hemolysis index [[id number]] Collected: 04/17/21 0148     Updated: 04/17/21 2052     Hemolysis Index 19 Index     Glucose Whole Blood - POCT [[id number]]  (Abnormal) Collected: 04/17/21 2016     Updated: 04/17/21 2021     Whole Blood Glucose POCT 285 mg/dL     Glucose Whole Blood - POCT [[id number]]  (Abnormal) Collected: 04/17/21 1608     Updated: 04/17/21 1619     Whole Blood Glucose POCT 357 mg/dL     Ferritin [[id number]] Collected: 04/17/21 0734    Specimen: Blood Updated: 04/17/21 1334     Ferritin 51.60 ng/mL     Narrative:      This is NOT the correct Test for Patients with  Hemoglobinopathy.    Iron [[id number]]  (Abnormal) Collected: 04/17/21 0734    Specimen: Blood Updated: 04/17/21 1326     Iron 33 ug/dL     Narrative:      This is NOT the correct Test for Patients with  Hemoglobinopathy.    Hemolysis index [[id number]] Collected: 04/17/21 0734     Updated: 04/17/21 1326     Hemolysis Index 18 Index     Narrative:      This is NOT the correct Test for Patients with  Hemoglobinopathy.    Anti-Xa, UFH [[id number]] Collected: 04/17/21 1212     Updated: 04/17/21 1249     Anti-Xa, UFH 0.21 IU/mL     Narrative:      Every 8 hours after initiation and every  rate change. After  two subsequent values within goal range, change frequency to  IHS daily at 0400 until heparin is discontinued.  If heparin infusion is on MAR hold do not draw this lab.    Hemoglobin A1C [[id number]]  (Abnormal) Collected: 04/17/21 0148    Specimen: Blood  Updated: 04/17/21 1245     Hemoglobin A1C >14.0 %      Average Estimated Glucose see below mg/dL     Narrative:      This is NOT the correct Test for Patients with  Hemoglobinopathy.    Glucose Whole Blood - POCT [[id number]]  (Abnormal) Collected: 04/17/21 1104     Updated: 04/17/21 1120     Whole Blood Glucose POCT 188 mg/dL     Anti-Xa, UFH [[id number]] Collected: 04/17/21 0734     Updated: 04/17/21 0814     Anti-Xa, UFH 0.15 IU/mL     Narrative:      Every 8 hours after initiation and every rate change. After  two subsequent values within goal range, change frequency to  IHS daily at 0400 until heparin is discontinued.  If heparin infusion is on MAR hold do not draw this lab.  [place] [id number] at 04/17/2021 07:33 Reason: Printed by   mistake/Printing Issues.    Glucose Whole Blood - POCT [[id number]]  (Abnormal) Collected: 04/17/21 0800     Updated: 04/17/21 0813     Whole Blood Glucose POCT 201 mg/dL     Magnesium [[id number]] Collected: 04/17/21 0734    Specimen: Blood Updated: 04/17/21 0807     Magnesium 2.3 mg/dL     Narrative:      Res[place]uled [place] [id number] at 04/17/2021 07:33 Reason: Printed by   mistake/Printing Issues.    Glucose Whole Blood - POCT [[id number]]  (Abnormal) Collected: 04/17/21 0404     Updated: 04/17/21 0407     Whole Blood Glucose POCT 289 mg/dL     High Sensitivity Troponin-I at 2 hrs with calculated Delta [[id number]] Collected: 04/17/21 0148    Specimen: Blood Updated: 04/17/21 0242     hs Troponin-I 30.1 ng/L      hs Troponin-I Delta -2.8 ng/L     Magnesium [[id number]]  (Abnormal) Collected: 04/17/21 0148    Specimen: Blood Updated: 04/17/21 0241     Magnesium 1.4 mg/dL     GFR [[id number]] Collected: 04/17/21 0148     Updated: 04/17/21 0241     EGFR 59.3       Basic Metabolic Panel [[id number]]  (Abnormal) Collected: 04/17/21 0148    Specimen: Blood Updated: 04/17/21 0241     Glucose 355 mg/dL      BUN 35.5 mg/dL      Creatinine 1.2 mg/dL      Calcium 9.1 mg/dL      Sodium 732 mEq/L       Potassium 3.7 mEq/L      Chloride 96 mEq/L      CO2 22 mEq/L      Anion Gap 12.0    CBC and differential [[id number]]  (Abnormal) Collected: 04/17/21 0148    Specimen: Blood Updated: 04/17/21 0222     WBC 8.47 x10 3/uL      Hgb 14.0 g/dL      Hematocrit 23.7 %      Platelets 190 x10 3/uL      RBC 5.43 x10 6/uL      MCV 78.5 fL      MCH 25.8 pg      MCHC 32.9 g/dL  RDW 15 %      MPV 9.9 fL      Instrument Absolute Neutrophil Count 4.66 x10 3/uL      Neutrophils 55.0 %      Lymphocytes Automated 27.7 %      Monocytes 10.5 %      Eosinophils Automated 4.7 %      Basophils Automated 1.7 %      Immature Granulocytes 0.4 %      Nucleated RBC 0.0 /100 WBC      Neutrophils Absolute 4.66 x10 3/uL      Lymphocytes Absolute Automated 2.35 x10 3/uL      Monocytes Absolute Automated 0.89 x10 3/uL      Eosinophils Absolute Automated 0.40 x10 3/uL      Basophils Absolute Automated 0.14 x10 3/uL      Immature Granulocytes Absolute 0.03 x10 3/uL      Absolute NRBC 0.00 x10 3/uL     COVID-19 (SARS-CoV-2) and Influenza A/B, NAA ([facility] Rapid)- Admission [[id number]] Collected: 04/16/21 2306    Specimen: Culturette from Nasopharyngeal Updated: 04/16/21 2350     Purpose of COVID testing Diagnostic -[facility]     SARS-CoV-2 Specimen Source Nasal Swab     SARS CoV 2 Overall Result Not Detected     Influenza A Not Detected     Influenza B Not Detected    Narrative:      o Collect and clearly label specimen type:  o PREFERRED-Upper respiratory specimen: One Nasal Swab in  Transport Media.  o Hand deliver to laboratory ASAP  Diagnostic -[facility]  Rescheduled by [id number] at 04/16/2021 23:05 Reason: I clicked the wrong   button, meant to hit no    Urinalysis Reflex to Microscopic Exam- Reflex to Culture [[id number]]  (Abnormal) Collected: 04/16/21 2304    Specimen: Urine, Clean Catch Updated: 04/16/21 2347     Urine Type Urine, Clean Ca     Color, UA Colorless     Clarity, UA Clear     Specific Gravity UA 1.005     Urine pH 5.0     Leukocyte Esterase, UA  Negative     Nitrite, UA Negative     Protein, UR Negative     Glucose, UA 500= 3+     Ketones UA Negative     Urobilinogen, UA Normal mg/dL      Bilirubin, UA Negative     Blood, UA Trace     RBC, UA 0-2 /hpf      WBC, UA 0-5 /hpf      Squamous Epithelial Cells, Urine 0-5 /hpf      Hyaline Casts, UA 0-2 /lpf     Glucose Whole Blood - POCT [[id number]]  (Abnormal) Collected: 04/16/21 2258     Updated: 04/16/21 2318     Whole Blood Glucose POCT 321 mg/dL     High Sensitivity Troponin-I at 0 hrs [[id number]] Collected: 04/16/21 2215    Specimen: Blood Updated: 04/16/21 2254     hs Troponin-I 32.9 ng/L     APTT [[id number]] Collected: 04/16/21 2117     Updated: 04/16/21 2157     PTT 27 sec     Narrative:      Obtain baseline prior to heparin initiation if not drawn  previously. Do not wait for result prior to heparin  initiation.  If heparin infusion is on MAR hold do not draw this lab.  Every 8 hours after initiation and every rate change. After  two subsequent values within goal  range, change frequency to  IHS daily at 0400 until heparin is discontinued.  If heparin infusion is on MAR hold do not draw this lab.    Anti-Xa, UFH [[id number]] Collected: 04/16/21 2117     Updated: 04/16/21 2157     Anti-Xa, UFH <0.04 IU/mL     Narrative:      Obtain baseline prior to heparin initiation if not drawn  previously. Do not wait for result prior to heparin  initiation.  If heparin infusion is on MAR hold do not draw this lab.  Every 8 hours after initiation and every rate change. After  two subsequent values within goal range, change frequency to  IHS daily at 0400 until heparin is discontinued.  If heparin infusion is on MAR hold do not draw this lab.    D-Dimer [[id number]]  (Abnormal) Collected: 04/16/21 2117     Updated: 04/16/21 2143     D-Dimer 0.68 ug/mL FEU     Glucose Whole Blood - POCT [[id number]]  (Abnormal) Collected: 04/16/21 2056     Updated: 04/16/21 2059     Whole Blood Glucose POCT 367 mg/dL     TSH [[id number]]  Collected: 04/16/21 1623    Specimen: Blood Updated: 04/16/21 1824     TSH 0.98 uIU/mL     Magnesium [[id number]] Collected: 04/16/21 1623    Specimen: Blood Updated: 04/16/21 1809     Magnesium 1.7 mg/dL     NT-proBNP [[id number]]  (Abnormal) Collected: 04/16/21 1623     Updated: 04/16/21 1725     NT-proBNP 4,162 pg/mL     High Sensitivity Troponin-I at 0 hrs [[id number]] Collected: 04/16/21 1623    Specimen: Blood Updated: 04/16/21 1723     hs Troponin-I 31.0 ng/L     Comprehensive metabolic panel [[id number]]  (Abnormal) Collected: 04/16/21 1623    Specimen: Blood Updated: 04/16/21 1716     Glucose 449 mg/dL      BUN 88.4 mg/dL      Creatinine 1.1 mg/dL      Sodium 166 mEq/L      Potassium 4.6 mEq/L      Chloride 98 mEq/L      CO2 19 mEq/L      Calcium 8.9 mg/dL      Protein, Total 6.3 g/dL      Albumin 3.3 g/dL      AST (SGOT) 23 U/L      ALT 26 U/L      Alkaline Phosphatase 154 U/L      Bilirubin, Total 0.8 mg/dL      Globulin 3.0 g/dL      Albumin/Globulin Ratio 1.1     Anion Gap 11.0    GFR [[id number]] Collected: 04/16/21 1623     Updated: 04/16/21 1716     EGFR >60.0       PT/APTT [[id number]] Collected: 04/16/21 1623     Updated: 04/16/21 1713     PT 11.6 sec      PT INR 1.0     PTT 29 sec     CBC and differential [[id number]]  (Abnormal) Collected: 04/16/21 1623    Specimen: Blood Updated: 04/16/21 1656     WBC 8.10 x10 3/uL      Hgb 15.2 g/dL      Hematocrit 23.7 %      Platelets 184 x10 3/uL      RBC 5.77 x10 6/uL      MCV 80.8 fL      MCH 26.3 pg  MCHC 32.6 g/dL      RDW 15 %      MPV 10.5 fL      Instrument Absolute Neutrophil Count 4.54 x10 3/uL      Neutrophils 56.0 %      Lymphocytes Automated 31.0 %      Monocytes 7.4 %      Eosinophils Automated 3.5 %      Basophils Automated 1.6 %      Immature Granulocytes 0.5 %      Nucleated RBC 0.0 /100 WBC      Neutrophils Absolute 4.54 x10 3/uL      Lymphocytes Absolute Automated 2.51 x10 3/uL      Monocytes Absolute Automated 0.60 x10 3/uL      Eosinophils  Absolute Automated 0.28 x10 3/uL      Basophils Absolute Automated 0.13 x10 3/uL      Immature Granulocytes Absolute 0.04 x10 3/uL      Absolute NRBC 0.00 x10 3/uL               Hospital Course:     76yo male with PMHx notable for pAfib, DM2 on insulin, GERD and med-nonadherence 2/2 financial barriers who presents to the hospital with BLE edema, chest pain and hyperglycemia, found to have Afib with RVR.     On admission, patient still heart rates poorly controlled on diltiazem drip.  Cardiology was consulted for further recommendations.  Endocrinology was consulted given hemoglobin A1c 14% in light of financial restrictions.     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 Diltiazem was stopped due to contraindication in HFrEF. Patient was easily rate controlled with PO metoprolol. Also noted to have LV thrombus and started on Eliquis for ease of dosing and monitoring.    He was also started on insulin and BGs were in the 160s-200s by discharge. Discharged on 70/30 insulin 30u qbreakfast and 20u qdinner. Noted to have some urinary retention secondary to constipation and likely BPH. Started on flomax and given a dose of pericolace with resolution of constpation and urinary retention.     CM has helped patient to understand his insurance options. He will need to get a PCP with the [facility] system. In the meanwhile, he will be seen in the [facility] system.      DISCHARGE DAY EXAM:  Patient Vitals for the past 24 hrs:   BP Temp Temp src Pulse Resp SpO2 Weight   04/19/21 1118 113/56 97.8 F (36.6 C) Oral (!) 103 18 96 % --   04/19/21 1001 136/58 -- -- -- -- -- --   04/19/21 0846 -- -- -- (!) 109 -- -- --   04/19/21 0843 126/73 98.1 F (36.7 C) Oral -- -- 96 % --   04/19/21 0409 -- -- -- -- -- -- 71.8 kg (158 lb 4.8 oz)   04/19/21 0344 129/58 97.6 F (36.4 C) Oral 80 18 96 % --   04/18/21 2258 116/65 -- -- --  -- -- --   04/18/21 2226 122/80 97.6 F (36.4 C) Oral 82 18 92 % --   04/18/21 2138 -- -- -- 86 -- -- --   04/18/21 1908 132/86 98.2 F (36.8 C) Oral 80 18 -- --   04/18/21 1748 149/68 -- -- 87 -- -- --   04/18/21 1630 135/74 97.7 F (36.5 C) Oral 91 18 97 % --  Body mass index is 24.79 kg/m.  General: awake, alert, oriented x 3; no acute distress.  Cardiovascular: regular rate and rhythm, no murmurs, rubs or gallops  Lungs: clear to auscultation bilaterally, without wheezing, rhonchi, or rales  Abdomen: soft, non-tender, non-distended; no palpable masses, no hepatosplenomegaly, normoactive bowel sounds, no rebound or guarding  Extremities: no clubbing, cyanosis, or edema    Discharge Medications:        Medication List        START taking these medications      * apixaban 5 MG  Commonly known as: ELIQUIS  Take 2 tablets (10 mg) by mouth every 12 (twelve) hours for 4 days     * apixaban 5 MG  Commonly known as: ELIQUIS  Take 1 tablet (5 mg) by mouth every 12 (twelve) hours  Start taking on: April 25, 2021     atorvastatin 80 MG tablet  Commonly known as: LIPITOR  Take 1 tablet (80 mg) by mouth nightly     BD Pen Needle Micro U/F 32G X 6 MM Misc  Generic drug: Insulin Pen Needle  Use to inject insulin into the skin 2 times per day. Ok to substitute with needle length and/or gauge of choice.     furosemide 40 MG tablet  Commonly known as: LASIX  Take 0.5 tablets (20 mg) by mouth as needed (if you gain 2 lbs in 24h or 5 lbs in 5 days)     gabapentin 300 MG capsule  Commonly known as: NEURONTIN  Take 1 capsule (300 mg) by mouth every 8 (eight) hours     insulin NPH-regular 70-30 MIXTURE (70-30) 100 UNIT/ML injection  Commonly known as: HumuLIN 70/30  Inject 30 units with breakfast and 20 units with dinner     metoprolol succinate XL 25 MG 24 hr tablet  Commonly known as: TOPROL-XL  Take 1 tablet (25 mg) by mouth 2 (two) times daily     pantoprazole 40 MG tablet  Commonly known as: PROTONIX  Take 1 tablet (40 mg)  by mouth every morning before breakfast  Start taking on: April 20, 2021     tamsulosin 0.4 MG Caps  Commonly known as: FLOMAX  Take 1 capsule (0.4 mg) by mouth Daily after dinner  Start taking on: April 20, 2021     traZODone 50 MG tablet  Commonly known as: DESYREL  Take 1 tablet (50 mg) by mouth nightly     valsartan 40 MG tablet  Commonly known as: DIOVAN  Take 1 tablet (40 mg) by mouth daily  Start taking on: April 20, 2021           * This list has 2 medication(s) that are the same as other medications prescribed for you. Read the directions carefully, and ask your doctor or other care provider to review them with you.                STOP taking these medications      glucose blood test strip     Lancets 30G Misc     Lancets 33G Misc     OneTouch Ultra 2 w/Device Kit     ReliOn Ultima Glucose System w/Device Kit     ReliOn Ultima Test test strip  Generic drug: glucose blood     ReliOn Ultra Thin Plus Lancets Misc               Where to Get Your Medications  These medications were sent to [facility]  [address] [place] [address]      Hours: Monday - Friday 8AM to 8PM, Saturday - Sunday 8AM to 8PM Phone: [phone]   apixaban 5 MG  apixaban 5 MG  atorvastatin 80 MG tablet  BD Pen Needle Micro U/F 32G X 6 MM Misc  furosemide 40 MG tablet  gabapentin 300 MG capsule  insulin NPH-regular 70-30 MIXTURE (70-30) 100 UNIT/ML injection  metoprolol succinate XL 25 MG 24 hr tablet  pantoprazole 40 MG tablet  tamsulosin 0.4 MG Caps  traZODone 50 MG tablet  valsartan 40 MG tablet       Discharge Instructions:     Disposition:  home    Multidisciplinary Visit: Provider(s) to Follow Up With       [facility]   Specialty: Endocrinology    [address]  [place] [address]  [place] [address]   Phone: [phone]       Next Steps: Call    Instructions: follow up for diabetes    Phil Dopp, MD   Specialty: Interventional  Cardiology, Cardiology, Internal Medicine    [facility] - Cardiology  [address]  [place] [address]   Phone: [phone]       Next Steps: Schedule an appointment as soon as possible for a visit in 1 month(s)    [facility]   Specialty: Family Medicine    [address]  [place] [address]   Phone: [phone]       Next Steps: Schedule an appointment as soon as possible for a visit in 1 week(s)    Instructions: to follow up on hospitalization and to get assistance with your medications          Minutes spent coordinating discharge and reviewing discharge plan: 55 minutes      Signed by: Diona Foley, MD, MD    CC: Pcp, None, MD

## 2021-04-19 NOTE — Progress Notes (Signed)
ENDOCRINE PROGRESS NOTE      Date Time: 04/19/21 10:26 AM  Patient Name: Scott Monroe, [name]  Attending Physician: Diona Foley, MD  Consulting Attending Physician: Diona Foley, MD   Admit Date:  04/16/2021  Payor: MEDICARE / Plan: MEDICARE PART A ONLY / Product Type: Medicare /   Orders Placed This Encounter   Procedures    Diet Consistent Carbohydrate and Heart Healthy Na restriction, if any: 2 GM NA; Fluid restriction: 1500 ML FLUID        I personally spent 35 minutes today, exclusive of procedures, providing care for this patient, including preparation, face to face time, EMR documentaton and other services such as review of medical record, diagnostic results, patient education, counseling, coordination of care as specified in the encounter.    Assessment/Plan:   [name]. is a 76 y.o. male with past medical history of type 2 diabetes, atrial fibrillation, and hypertension who presented to the hospital on 04/16/2021 with chest pain and shortness of breath, found to have atrial fibrillation with RVR. Endocrinology consulted for management of type 2 diabetes with hyperglycemia.     Uncontrolled type 2 diabetes with hyperglycemia: HbA1c this admission >14%  Patient severely hyperglycemic on admission, in the setting of inability to afford medications and not taking antihyperglycemic agents  Patient with ongoing hyperglycemia  Continue NPH 15 units daily before breakfast (give 50% of dose if NPO)  Increase evening NPH to 12 units (give 50% if NPO)  Increase lispro to 10 units qAC; Hold if not eating/NPO. Reduce dose by 50% if glucose is less than 120 mg/dL  Continue Lispro MDSSI every 4 hours for additional hyperglycemic coverage  Will continue to monitor for hypoglycemia and hyperglycemia and adjust insulin regimen as needed  Patient with HbA1c greater than 14%, so recommend insulin on discharge, along with metformin and glipizide. Given financial strain, discussed with case management need  for insulin coverage on discharge  Pt feels comfortable with insulin vials, appreciate RN teaching insulin administration  Please provide referral for transitional care clinic on discharge  BG goal in hospital: 140 to 180 mg/dl  Discharge recommendations as below               Endocrine Recommendations         Home regimen     None         Discharge regimen    Insulin  NPH/regular 70/30 insulin VIALS 30 units before breakfast and 20 units before dinner, Metformin 1000 mg PO BID and glipizide 5 mg p.o. once daily         Follow up provider    Outpatient endocrinology referral placed, transitional care clinic           I have reviewed the following: blood glucose levels, insulin requirements  I have ordered the following: insulin changes as above    Lind Guest, MD  [facility] Endocrinology  Epic Chat or pager ID: [phone]  On nights, holidays and weekends please page the endocrine consult pager [phone]) for emergencies.     Subjective:    Pt seen at bedside, reports that he has learned to give himself insulin via the vials with the bedside nurses. Has no complaints.    Review of Systems:   10 point ROS reviewed and negative, except per subjective/HPI.    Physical Exam:   Temp:  [97.4 F (36.3 C)-98.2 F (36.8 C)] 98.1 F (36.7 C)  Heart Rate:  [80-109] 109  Resp Rate:  [18] 18  BP: (116-157)/(58-86) 126/73  Wt Readings from Last 1 Encounters:   04/19/21 71.8 kg (158 lb 4.8 oz)      Body mass index is 24.79 kg/m.     Intake/Output Summary (Last 24 hours) at 04/19/2021 1026  Last data filed at 04/19/2021 0600  Gross per 24 hour   Intake 120 ml   Output 1470 ml   Net -1350 ml       General: Sleeping in chair, no acute distress  Lungs: normal work of breathing on room air  Abdomen: non-distended  Neuro: no gross motor deficits    Meds:     Scheduled Meds:  Current Facility-Administered Medications   Medication Dose Route Frequency    apixaban  10 mg Oral Q12H SCH    [START ON 04/25/2021] apixaban  5 mg Oral Q12H SCH    atorvastatin   80 mg Oral QHS    gabapentin  300 mg Oral Q8H SCH    insulin lispro  1-8 Units Subcutaneous Q4H SCH    insulin lispro  10 Units Subcutaneous TID AC    insulin NPH  12 Units Subcutaneous QHS    insulin NPH  15 Units Subcutaneous QAM AC    metoprolol succinate XL  25 mg Oral BID    nicotine  1 patch Transdermal Daily    pantoprazole  40 mg Oral QAM AC    traZODone  50 mg Oral QHS    valsartan  40 mg Oral Daily     Continuous Infusions:      PRN Meds:.acetaminophen, dextrose **OR** dextrose **OR** dextrose **OR** glucagon (rDNA), heparin (porcine), magnesium sulfate, melatonin, naloxone, potassium & sodium phosphates, potassium chloride **AND** potassium chloride    Labs:     Lab Results   Component Value Date    HGBA1C >14.0 (H) 04/17/2021       Whole Blood Glucose POCT   Date/Time Value Ref Range Status   04/19/2021 0736 232 (H) 70 - 100 mg/dL Final   16/10/9602 5409 167 (H) 70 - 100 mg/dL Final   81/19/1478 2956 230 (H) 70 - 100 mg/dL Final     Comment:     RN Notified   04/18/2021 2226 162 (H) 70 - 100 mg/dL Final   21/30/8657 8469 180 (H) 70 - 100 mg/dL Final     Comment:     RN Notified       Recent Labs     04/19/21  0417 04/18/21  0404   WBC 6.87 7.28   Hgb 13.6 13.5   Hematocrit 41.0 40.2   Platelets 177 164   MCV 79.6 79.0         Recent Labs     04/19/21  0417 04/18/21  0404   Sodium 135 132*   Potassium 4.2 3.8   Chloride 105 101   CO2 22 21   BUN 22.0 21.0   Creatinine 1.2 1.1   Glucose 178* 213*   Calcium 8.5 8.7   Magnesium 2.0 1.8         Recent Labs     04/16/21  1623   AST (SGOT) 23   ALT 26   Alkaline Phosphatase 154*   Protein, Total 6.3   Albumin 3.3*         Recent Labs     04/16/21  2117 04/16/21  1623   PTT 27 29   PT  --  11.6   PT INR  --  1.0  Signed by: Marilynne Halsted, MD

## 2021-04-19 NOTE — Plan of Care (Incomplete)
Pt name/Code: Mr.Asmar Percival Spanish. (76 y.o. male);NO CPR - SUPPORT OK  Admit Date/Dx: 04/16/2021 Atrial fibrillation with RVR  Weights: Last 3 Weights for the past 72 hrs (Last 3 readings):   Weight   04/19/21 0409 71.8 kg (158 lb 4.8 oz)   04/18/21 0740 71.1 kg (156 lb 11.2 oz)   04/17/21 0522 69.4 kg (152 lb 14.4 oz)     Shift comment:  Patient complained of having burning sensation when urinating with pain.   Provider notified, Urinalysis pending and bladder scan done for retention    Patient given flowmax and senna to help with constipation  Patient able to void 600 total     Plan:            Problem: Moderate/High Fall Risk Score >5  Goal: Patient will remain free of falls  Flowsheets (Taken 04/19/2021 1307)  Moderate Risk (6-13):   LOW-Fall Interventions Appropriate for Low Fall Risk   MOD-Apply bed exit alarm if patient is confused

## 2021-04-19 NOTE — Progress Notes (Signed)
[facility] [facility]ARDI[facility] PROGRESS NOTE     Date Time:    04/19/21    10:39 AM  Patient Name: Scott Monroe.    LOS: 3 days     Subjective:  No cardiac events. Denies any palpitations, SOB, chest pain.     Pt does indicate he has noticed swelling in his bilateral lower extremities.     Spoke with daughter Scott Monroe on the phone and discussed treatment plan from cardiology perspective.     Scheduled Meds:   apixaban, 10 mg, Oral, Q12H SCH  [START ON 04/25/2021] apixaban, 5 mg, Oral, Q12H SCH  atorvastatin, 80 mg, Oral, QHS  gabapentin, 300 mg, Oral, Q8H SCH  insulin lispro, 1-8 Units, Subcutaneous, Q4H SCH  insulin lispro, 10 Units, Subcutaneous, TID AC  insulin NPH, 12 Units, Subcutaneous, QHS  insulin NPH, 15 Units, Subcutaneous, QAM AC  metoprolol succinate XL, 25 mg, Oral, BID  nicotine, 1 patch, Transdermal, Daily  pantoprazole, 40 mg, Oral, QAM AC  traZODone, 50 mg, Oral, QHS  valsartan, 40 mg, Oral, Daily     Continuous Infusions:   PRN Meds: acetaminophen, dextrose **OR** dextrose **OR** dextrose **OR** glucagon (rDNA), heparin (porcine), magnesium sulfate, melatonin, naloxone, potassium & sodium phosphates, potassium chloride **AND** potassium chloride    Objective:  Physical Exam:  BP 126/73   Pulse (!) 109   Temp 98.1 F (36.7 C) (Oral)   Resp 18   Ht 1.702 m (5\' 7" )   Wt 71.8 kg (158 lb 4.8 oz)   SpO2 96%   BMI 24.79 kg/m      Intake/Output Summary (Last 24 hours) at 04/19/2021 0700  Last data filed at 04/19/2021 0600  Monroe per 24 hour   Intake 300 ml   Output 1770 ml   Net -1470 ml     Wt Readings from Last 1 Encounters:   04/19/21 0409 71.8 kg (158 lb 4.8 oz)   04/18/21 0740 71.1 kg (156 lb 11.2 oz)   04/17/21 0522 69.4 kg (152 lb 14.4 oz)   04/16/21 1906 69.8 kg (153 lb 14.4 oz)   04/16/21 1612 77.1 kg (170 lb)     General: awake, alert, oriented x 3, no acute distress.  Cardiovascular: +irregular rate and rhythm, no murmurs, rubs or gallops     Lungs: clear to auscultation bilaterally, without  wheezing, rhonchi, or rales  Abdomen: soft, non-tender, non-distended, no hepatosplenomegaly, normoactive bowel sounds  Extremities: +trace to 1+ pitting edema; no clubbing, cyanosis     Lab Review:   Recent Labs     04/19/21  0417 04/18/21  0404 04/17/21  0148   WBC 6.87 7.28 8.47   Hgb 13.6 13.5 14.0   Hematocrit 41.0 40.2 42.6   Platelets 177 164 190     Recent Labs     04/17/21  0148 04/16/21  2215 04/16/21  2117 04/16/21  1623   hs Troponin-I 30.1 32.9  --  31.0   hs Troponin-I Delta -2.8  --   --   --    NT-proBNP  --   --   --  06/16/21*   D-Dimer  --   --  0.68*  --     Recent Labs     04/19/21  0417 04/18/21  0404 04/17/21  0734 04/17/21  0148   Sodium 135 132*  --  130*   Potassium 4.2 3.8  --  3.7   CO2 22 21  --  22   BUN 22.0 21.0  --  13.0   Creatinine 1.2 1.1  --  1.2   Glucose 178* 213*  --  355*   Magnesium 2.0 1.8 2.3 1.4*    Estimated Creatinine Clearance: 51.3 mL/min (based on SCr of 1.2 mg/dL).   Recent Labs     04/16/21  1623   PT INR 1.0   TSH 0.98        Telemetry: Afib w/ HR 70s-90s-110s    Echocardiogram: 04/18/2021  Summary    * Left ventricular systolic function is severely decreased with an ejection  fraction by Biplane Method of Discs of  30 %.    * There is mild concentric left ventricular hypertrophy.    * Moderate global hypokinesis without regional variation.    * Ungraded diastolic filling parameters in atrial fibrillation.    * LV apical mass (thrombus), measuring 1.1 x 0.7 cm.    * The right ventricular cavity size is normal in size.    * Moderate to severely decreased right ventricular systolic function.    * TAPSE = 0.9 cm, S' = 9 cm/s, fractional area change = 17 %, 3D EF 17%.    * The left atrium is moderately dilated.    * The right atrium is mildly dilated.    * The mitral valve is thickened and calcified.    * There is mild mitral regurgitation.    * Moderate pulmonary hypertension with estimated right ventricular systolic  pressure of  48 mmHg.    * The inferior vena cava is  dilated with poor inspiratory collapse,  consistent with elevated right atrial pressure.    * Trivial to small circumferential pericardial effusion visualized.    * Clinical service was notified of results by securechat.    * Since prior echo 08/20/20 reviewed, EF 63 to 30%, RV dysFx new, PHT new,  effusion new.     Imaging:  Radiology Results (24 Hour)       ** No results found for the last 24 hours. **            Assessment:   Scott Monroe. is a 76 y.o. male with a history of atrial fibrillation, HTN, HLD, DM2, current smoker, chronic back pain who presented to the hospital on 04/16/2021 with 2 weeks of progressive exertional chest pain and dry cough found to be in afib RVR.     - Afib RVR now rate controlled and tachycardia noted with movement  - newly diagnosed cardiomyopathy with EF 30%  - slight lower extremity edema   - LV thrombus   - moderate pHTN  - HTN  - HLD   - DM II  - chronic back pain  - Social issue: currently living with his daughter but does not have prescription insurance and does not have reliable transportation. Daughter may be able to help  - poor health literacy    Plan:  - continue Eliquis off label for LV thrombus  - continue w/ valsartan 40mg  q daily  - continue with metoprolol XL 25mg  BID although if heart rate remains >100 then may want to increase for better rate control  - defer ischemic evaluation for now and start with GDMT to monitor for clinical response and adherence; will need OP cardiology follow up   - DM II management per primary team      Signed by: Houston Siren, PA-C    I performed the substantive portion of the visit by personally conducting the Medical Decision-Making component, in  its entirety. Patient was also seen by APP Dipal Celine Mansesai for the history and/or examination portion of the visit. I reviewed and updated the documented findings and plan or care accordingly as noted.     Stanford BreedJason Taneya Conkel, MD PhD  [facility]        [facility]   [facility] NP  Spectralink [phone] or [phone] (M-F 8 am-5 pm)   [facility] Spectralink [phone]  (M-F 8 am-5 pm)  After Hours On-Call Physician: [phone]

## 2021-04-19 NOTE — Progress Notes (Signed)
Discharge order in. Patient needed to void and have a bowl movement prior to discharge. Patient voided a total amount of 500cc prior to discharge. Patient had two bowl movements. Provider informed. Patient telemetry discontinued and IV's off. Patients daughter was informed that pt medications would be ready in-hospital pharmacy. Pharmacy delivered medications to patient's room. RN reviewed AVS and medication bottles with patient. Patient was encouraged to ask questions and all questions were answered. Patient escorted off by RN unit via wheel chair.

## 2021-04-20 ENCOUNTER — Telehealth: Payer: Self-pay

## 2021-04-20 NOTE — Telephone Encounter (Signed)
Ambulatory Care Management    Call to patient/daughter at [phone] to check on status of patient-message states mailbox full-only number listed on chart    Collene Mares BSN, RN, CCM  RN Case Manager II  Ambulatory Care Management  [address] [place], [place] [address]  Ph: [phone]

## 2021-04-21 ENCOUNTER — Emergency Department
Admission: EM | Admit: 2021-04-21 | Discharge: 2021-04-21 | Discharge status: Against Medical Advice | Payer: Non-veteran care | Attending: Internal Medicine | Admitting: Internal Medicine

## 2021-04-21 ENCOUNTER — Emergency Department: Payer: Non-veteran care

## 2021-04-21 ENCOUNTER — Telehealth: Payer: Self-pay

## 2021-04-21 DIAGNOSIS — I4510 Unspecified right bundle-branch block: Secondary | ICD-10-CM

## 2021-04-21 DIAGNOSIS — F17210 Nicotine dependence, cigarettes, uncomplicated: Secondary | ICD-10-CM | POA: Insufficient documentation

## 2021-04-21 DIAGNOSIS — I4891 Unspecified atrial fibrillation: Secondary | ICD-10-CM

## 2021-04-21 DIAGNOSIS — I5020 Unspecified systolic (congestive) heart failure: Secondary | ICD-10-CM

## 2021-04-21 DIAGNOSIS — Z7901 Long term (current) use of anticoagulants: Secondary | ICD-10-CM | POA: Insufficient documentation

## 2021-04-21 DIAGNOSIS — Z5329 Procedure and treatment not carried out because of patient's decision for other reasons: Secondary | ICD-10-CM | POA: Insufficient documentation

## 2021-04-21 DIAGNOSIS — N3943 Post-void dribbling: Secondary | ICD-10-CM | POA: Insufficient documentation

## 2021-04-21 DIAGNOSIS — N401 Enlarged prostate with lower urinary tract symptoms: Secondary | ICD-10-CM | POA: Insufficient documentation

## 2021-04-21 DIAGNOSIS — E1165 Type 2 diabetes mellitus with hyperglycemia: Secondary | ICD-10-CM

## 2021-04-21 LAB — CBC AND DIFFERENTIAL
Absolute NRBC: 0 10*3/uL (ref 0.00–0.00)
Basophils Absolute Automated: 0.1 10*3/uL — ABNORMAL HIGH (ref 0.00–0.08)
Basophils Automated: 1.3 %
Eosinophils Absolute Automated: 0.5 10*3/uL — ABNORMAL HIGH (ref 0.00–0.44)
Eosinophils Automated: 6.5 %
Hematocrit: 46.4 % (ref 37.6–49.6)
Hgb: 15 g/dL (ref 12.5–17.1)
Immature Granulocytes Absolute: 0.02 10*3/uL (ref 0.00–0.07)
Immature Granulocytes: 0.3 %
Instrument Absolute Neutrophil Count: 4.25 10*3/uL (ref 1.10–6.33)
Lymphocytes Absolute Automated: 2.12 10*3/uL (ref 0.42–3.22)
Lymphocytes Automated: 27.5 %
MCH: 26.2 pg (ref 25.1–33.5)
MCHC: 32.3 g/dL (ref 31.5–35.8)
MCV: 81.1 fL (ref 78.0–96.0)
MPV: 9.3 fL (ref 8.9–12.5)
Monocytes Absolute Automated: 0.71 10*3/uL (ref 0.21–0.85)
Monocytes: 9.2 %
Neutrophils Absolute: 4.25 10*3/uL (ref 1.10–6.33)
Neutrophils: 55.2 %
Nucleated RBC: 0 /100 WBC (ref 0.0–0.0)
Platelets: 191 10*3/uL (ref 142–346)
RBC: 5.72 10*6/uL (ref 4.20–5.90)
RDW: 15 % (ref 11–15)
WBC: 7.7 10*3/uL (ref 3.10–9.50)

## 2021-04-21 LAB — PROBNP: NT-proBNP: 5638 pg/mL — ABNORMAL HIGH (ref 0–125)

## 2021-04-21 LAB — COMPREHENSIVE METABOLIC PANEL
ALT: 21 U/L (ref 0–55)
AST (SGOT): 23 U/L (ref 5–41)
Albumin/Globulin Ratio: 1 (ref 0.9–2.2)
Albumin: 3.2 g/dL — ABNORMAL LOW (ref 3.5–5.0)
Alkaline Phosphatase: 146 U/L — ABNORMAL HIGH (ref 37–117)
Anion Gap: 10 (ref 5.0–15.0)
BUN: 16 mg/dL (ref 9.0–28.0)
Bilirubin, Total: 0.8 mg/dL (ref 0.2–1.2)
CO2: 25 mEq/L (ref 17–29)
Calcium: 9.4 mg/dL (ref 7.9–10.2)
Chloride: 100 mEq/L (ref 99–111)
Creatinine: 1.1 mg/dL (ref 0.5–1.5)
Globulin: 3.3 g/dL (ref 2.0–3.6)
Glucose: 287 mg/dL — ABNORMAL HIGH (ref 70–100)
Potassium: 4.3 mEq/L (ref 3.5–5.3)
Protein, Total: 6.5 g/dL (ref 6.0–8.3)
Sodium: 135 mEq/L (ref 135–145)

## 2021-04-21 LAB — HIGH SENSITIVITY TROPONIN-I: hs Troponin-I: 29.6 ng/L

## 2021-04-21 LAB — ECG 12-LEAD
Atrial Rate: 131 {beats}/min
Q-T Interval: 322 ms
QRS Duration: 136 ms
QTC Calculation (Bezet): 505 ms
R Axis: 251 degrees
T Axis: -9 degrees
Ventricular Rate: 148 {beats}/min

## 2021-04-21 LAB — GFR: EGFR: >60

## 2021-04-21 MED ORDER — METOPROLOL SUCCINATE ER 25 MG PO TB24
25.0000 mg | ORAL_TABLET | Freq: Two times a day (BID) | ORAL | 0 refills | Status: DC
Start: 2021-04-21 — End: 2023-11-14

## 2021-04-21 MED ORDER — METOPROLOL TARTRATE 5 MG/5ML IV SOLN
5.0000 mg | Freq: Once | INTRAVENOUS | Status: AC
Start: 2021-04-21 — End: 2021-04-21
  Administered 2021-04-21: 5 mg via INTRAVENOUS
  Filled 2021-04-21: qty 5

## 2021-04-21 MED ORDER — FUROSEMIDE 10 MG/ML IJ SOLN
20.0000 mg | Freq: Every day | INTRAMUSCULAR | Status: DC
Start: 2021-04-21 — End: 2021-04-21

## 2021-04-21 MED ORDER — TAMSULOSIN HCL 0.4 MG PO CAPS
0.4000 mg | ORAL_CAPSULE | Freq: Every day | ORAL | 0 refills | Status: AC
Start: 2021-04-21 — End: 2021-04-28

## 2021-04-21 MED ORDER — METOPROLOL TARTRATE 50 MG PO TABS
50.0000 mg | ORAL_TABLET | Freq: Two times a day (BID) | ORAL | Status: DC
Start: 2021-04-21 — End: 2021-04-21

## 2021-04-21 NOTE — Telephone Encounter (Signed)
Ambulatory Care Management    Second call to patient/daughter at [phone] to check on status of patient-message states mailbox full-only number listed on chart    Collene Mares BSN, RN, CCM  RN Case Manager II  Ambulatory Care Management  [address] [place], [place] [address]  Ph: [phone]

## 2021-04-21 NOTE — ED Notes (Signed)
Patient is alert and oriented x4.  He is leaving AMA.  He has been given instructions and concerns about him leaving AMA and gives verbal understanding of this dangers.

## 2021-04-21 NOTE — ED Notes (Signed)
[facility] EMERGENCY DEPT  ED NURSING NOTE FOR THE RECEIVING INPATIENT NURSE   ED NURSE Priscille Kluver   [facility] [address]   ED CHARGE RN Lanora Manis   ADMISSION INFORMATION   Namish Krise. is a 76 y.o. male admitted with an ED diagnosis of:    1. Atrial fibrillation with RVR         Isolation: None   Allergies: Strawberry c [ascorbate]   Holding Orders confirmed? Yes   Belongings Documented? No   Home medications sent to pharmacy confirmed? No   NURSING CARE   Patient Comes From:   Mental Status: Home Independent  alert and oriented   ADL: Independent with all ADLs   Ambulation: no difficulty   Pertinent Information  and Safety Concerns:     Broset Violence Risk Level: Low 76 y/o M c/o dysuria and urinary retention. No hematuria. No other c/o. Pain 10/10. Pt. NAD and A&Ox4.      CT / NIH   CT Head ordered on this patient?  No   NIH/Dysphagia assessment done prior to admission? No   VITAL SIGNS (at the time of this note)      Vitals:    04/21/21 1410   BP: (!) 135/93   Pulse: (!) 125   Resp:    Temp:    SpO2: 96%

## 2021-04-21 NOTE — ED Triage Notes (Signed)
Pt. C/o 10/10 pain w/ urination that has been ongoing since being d/c here 2 days ago. Pt. Had a recent admission for a-fib, now on Eliquis 5mg  twice daily and metoprolol. Pt. Denies any cardiac/lung issues. NAD and A&Ox4. No trauma. No hematuria.

## 2021-04-21 NOTE — ED Provider Notes (Signed)
[facility] EMERGENCY DEPARTMENT   History and Physical Exam Note   Attending Physician: Ruffin Pyo, MD F[facility]           Visit date: 04/21/2021      CLINICAL SUMMARY           Diagnosis:    .     Final diagnoses:   Atrial fibrillation with RVR   Left against medical advice   Benign prostatic hyperplasia (BPH) with post-void dribbling         MDM Notes:    DDX: Primary arrhythmia ACS medication noncompliance electrolyte abnormality PE.    I discussed the case with [facility] cardiology who will consult with the patient.  Admission: I reviewed the patients case, pending results and initial impressions with Dr. Janece Canterbury who agrees with plan of care and accepts the patient for admission.    External Records Reviewed?: External Records Reviewed?: Inpatient Records  I reviewed inpatient records which revealed a low EF and therefore started beta-blockers and not calcium channel blockers.    Medical Decision Making  Amount and/or Complexity of Data Reviewed  Labs: ordered.  Radiology: ordered.    Risk  Prescription drug management.       The patient has decided not to proceed with further recommended testing or treatment to determine the cause of their symptoms.    The risks and alternatives to the recommendation were discussed in plain and simple language and weighted appropriately. We specifically discussed that untreated tachydysrhythmias are very likely to lead to worsening heart failure which could affect his ability to do such things as even walk up a flight of stairs or to the bathroom without being short of breath this may impair his ability to live his life in a significant way and leave him permanently and completely disabled as well as increases risk of sudden cardiac death from degeneration of malignant arrhythmia. The patient voiced understanding. The patient appears clinically to have capacity to make this decision. The patient was instructed that he/she could return to the ER at any  time to complete the testing or treatment.        Disposition:         Observation Admit      ED Disposition       ED Disposition   Observation    Condition   --    Date/Time   Fri Apr 21, 2021  2:19 PM    Comment   Admitting Physician: Karma Ganja [[id number]]   Service:: Medicine [[id number]]   Estimated Length of Stay: < 2 midnights   Tentative Discharge Plan?: Home or Self Care [1]   Does patient need telemetry?: Yes   Is patient 18 yrs or greater?: Yes   Telemetry type (separate Telemetry order is also required):: Adult telemetry                             CLINICAL INFORMATION        HPI:      Chief Complaint: Chest Pain and Shortness of Breath  .    Scott Jaquail Mclees. is a 76 y.o. male with PMH of HLD, HTN, irregular heartbeat, sleep apnea who presents with acute onset moderate chest pain associated with shortness of breath, onset last night.    Patient states that last night, he started having swelling in feet and ankles which started moving upwards.  He then had difficulty urinating, states he can  only get drips out, and endorses right abdominal and right groin pain.  Patient then had chest pain or shortness of breath.  Patient is on Eliquis.  Patient states he was here 2 days ago for similar symptoms and endorses compliance with discharge medications.    History obtained from: Patient          ROS:      Positive and negative ROS elements as per HPI.  All other systems reviewed and negative.      Physical Exam:      Pulse (!) 53  BP (!) 134/97  Resp 18  SpO2 97 %  Temp 97 F (36.1 C)     Physical Exam   Nursing note and vitals reviewed.  Constitutional: He is oriented to person, place, and time. He appears well-developed and well-nourished.   HENT:   Head: Normocephalic and atraumatic.   Eyes: Conjunctivae normal are normal. Pupils are equal, round, and reactive to light.   Neck: Normal range of motion. Neck supple.   Cardiovascular: Tachycardic irregularly irregular and normal heart sounds.     Pulmonary/Chest: Effort normal and breath sounds normal. No respiratory distress.   Abdominal: Soft. There is no tenderness. There is no guarding.   Musculoskeletal: Normal range of motion. He exhibits no tenderness.   Neurological: He is alert and oriented to person, place, and time. GCS eye subscore is 4. GCS verbal subscore is 5. GCS motor subscore is 6.        MAE   Skin: Skin is warm and dry.   Psychiatric: He has a normal mood and affect. His behavior is normal.             PAST HISTORY        Primary Care Provider: Pcp, None, MD        PMH/PSH:    .     Past Medical History:   Diagnosis Date    Hyperlipidemia     Hypertension     Insomnia     Irregular heart beat     Restless leg     Sleep apnea        He has a past surgical history that includes Back surgery and Spinal cord stimulator implant.      Social/Family History:      He reports that he has been smoking cigarettes. He has been smoking an average of 1 pack per day. He has never used smokeless tobacco. He reports that he does not drink alcohol and does not use drugs.    History reviewed. No pertinent family history.      Listed Medications on Arrival:    .     Home Medications       Med List Status: In Progress Set By: Lucienne Capers, RN at 04/21/2021  1:39 PM              apixaban (ELIQUIS) 5 MG     Take 2 tablets (10 mg) by mouth every 12 (twelve) hours for 4 days     apixaban (ELIQUIS) 5 MG     Take 1 tablet (5 mg) by mouth every 12 (twelve) hours     atorvastatin (LIPITOR) 80 MG tablet     Take 1 tablet (80 mg) by mouth nightly     furosemide (LASIX) 40 MG tablet     Take 0.5 tablets (20 mg) by mouth as needed (if you gain 2 lbs in 24h or 5 lbs in 5 days)  gabapentin (NEURONTIN) 300 MG capsule     Take 1 capsule (300 mg) by mouth every 8 (eight) hours     insulin NPH-regular 70-30 MIXTURE (HumuLIN 70/30) (70-30) 100 UNIT/ML injection     Inject 30 units with breakfast and 20 units with dinner     Insulin Pen Needle (BD Pen Needle Micro U/F) 32G  X 6 MM Misc     Use to inject insulin into the skin 2 times per day. Ok to substitute with needle length and/or gauge of choice.     metoprolol succinate XL (TOPROL-XL) 25 MG 24 hr tablet     Take 1 tablet (25 mg) by mouth 2 (two) times daily     pantoprazole (PROTONIX) 40 MG tablet     Take 1 tablet (40 mg) by mouth every morning before breakfast     tamsulosin (FLOMAX) 0.4 MG Cap     Take 1 capsule (0.4 mg) by mouth Daily after dinner     traZODone (DESYREL) 50 MG tablet     Take 1 tablet (50 mg) by mouth nightly     valsartan (DIOVAN) 40 MG tablet     Take 1 tablet (40 mg) by mouth daily           Allergies: He is allergic to strawberry c [ascorbate].            VISIT INFORMATION        Clinical Course in the ED:                 Medications Given in the ED:    .     ED Medication Orders (From admission, onward)      Start Ordered     Status Ordering Provider    04/21/21 1206 04/21/21 1205  metoprolol tartrate (LOPRESSOR) injection 5 mg  Once        Route: Intravenous  Ordered Dose: 5 mg     Last MAR action: Given Nikea Settle B              Procedures:      Procedures      Critical Care Time (not including procedures):  33 .  Due to the high risk of critical illness or multi-organ failure at initial presentation and/or during ED course.   System(s) at risk for compromise:  circulatory  Critical Diagnosis:   1. Atrial fibrillation with RVR    2. Left against medical advice    3. Benign prostatic hyperplasia (BPH) with post-void dribbling       The patient was Hypotensive:   No    The patient was Hypoxic:   No    This does not including time spent performing other reported procedures or services.  Critical care time involved full attention to the patient's condition and included:   Review of nursing notes and/or old charts - Yes  Documentation time - Yes  Care, transfer of care, and discharge plans - Yes  Obtaining necessary history from family, EMS, nursing home staff and/or treating physicians - Yes  Review of  medications, allergies, and vital signs - Yes   Consultant collaboration on findings and treatment options - Yes  Ordering, interpreting, and reviewing diagnostic studies/tab tests - Yes     Interpretations:      O2 Sat:  The patient's oxygen saturation was 97 % on room air. This was independently interpreted by me as Normal.   Cardiac Monitoring: I independently reviewed and interpreted the patient's cardiac monitoring  as atrial fibrillation at 150.  Radiology:  I reviewed and independently interpreted the following radiology studies: I personally reviewed the patient's plain film radiology.  EKG: I reviewed and Independently interpreted the patient's EKG as atrial fibrillation with RVR at 150.  ST segment depressions with T wave inversions in V2-V6, likely rate related ischemia.              RESULTS        Lab Results:      Results       Procedure Component Value Units Date/Time    High Sensitivity Troponin-I [[id number]] Collected: 04/21/21 1218    Specimen: Blood Updated: 04/21/21 1308     hs Troponin-I 29.6 ng/L     Comprehensive metabolic panel [[id number]]  (Abnormal) Collected: 04/21/21 1218    Specimen: Blood Updated: 04/21/21 1302     Glucose 287 mg/dL      BUN 84.6 mg/dL      Creatinine 1.1 mg/dL      Sodium 962 mEq/L      Potassium 4.3 mEq/L      Chloride 100 mEq/L      CO2 25 mEq/L      Calcium 9.4 mg/dL      Protein, Total 6.5 g/dL      Albumin 3.2 g/dL      AST (SGOT) 23 U/L      ALT 21 U/L      Alkaline Phosphatase 146 U/L      Bilirubin, Total 0.8 mg/dL      Globulin 3.3 g/dL      Albumin/Globulin Ratio 1.0     Anion Gap 10.0    GFR [[id number]] Collected: 04/21/21 1218     Updated: 04/21/21 1302     EGFR >60.0       NT-proBNP [[id number]]  (Abnormal) Collected: 04/21/21 1218     Updated: 04/21/21 1255     NT-proBNP 5,638 pg/mL     CBC and differential [[id number]]  (Abnormal) Collected: 04/21/21 1218    Specimen: Blood Updated: 04/21/21 1235     WBC 7.70 x10 3/uL      Hgb 15.0 g/dL      Hematocrit 25.9  %      Platelets 191 x10 3/uL      RBC 5.72 x10 6/uL      MCV 81.1 fL      MCH 26.2 pg      MCHC 32.3 g/dL      RDW 15 %      MPV 9.3 fL      Instrument Absolute Neutrophil Count 4.25 x10 3/uL      Neutrophils 55.2 %      Lymphocytes Automated 27.5 %      Monocytes 9.2 %      Eosinophils Automated 6.5 %      Basophils Automated 1.3 %      Immature Granulocytes 0.3 %      Nucleated RBC 0.0 /100 WBC      Neutrophils Absolute 4.25 x10 3/uL      Lymphocytes Absolute Automated 2.12 x10 3/uL      Monocytes Absolute Automated 0.71 x10 3/uL      Eosinophils Absolute Automated 0.50 x10 3/uL      Basophils Absolute Automated 0.10 x10 3/uL      Immature Granulocytes Absolute 0.02 x10 3/uL      Absolute NRBC 0.00 x10 3/uL                 Radiology Results:  Chest AP Portable   Final Result      1.No acute pulmonary process.   2.Stable cardiomegaly.      Legrand Pitts, MD   04/21/2021 12:38 PM                  Scribe Attestation:      I was acting as a Neurosurgeon for Ruffin Pyo, MD on [facility].  Treatment Team: Scribe: Vonita Moss     I am the first provider for this patient and I personally performed the services documented. Treatment Team: Scribe: Corwin Levins E is scribing for me on [facility] JR.Marland Kitchen This note and the patient instructions accurately reflect work and decisions made by me.  Ruffin Pyo, MD       Documentation Notes:    Parts of this note were generated by the Epic EMR system/ Dragon speech recognition and may contain inherent errors or omissions not intended by the user. Grammatical errors, random word insertions, deletions, pronoun errors and incomplete sentences are occasional consequences of this technology due to software limitations. Not all errors are caught or corrected.    My documentation is often completed after the patient is no longer under my clinical care. In some cases, the Epic EMR may pull updated results into the above documentation which may not reflect all results  or information that was available to me at the time of my medical decision making.     If there are questions or concerns about the content of this note or information contained within the body of this dictation they should be addressed directly with the author for clarification.       Ruffin Pyo, MD  04/22/21 [phone]

## 2021-04-21 NOTE — H&P (Signed)
TO ALL CONSULANTS    Please make sure that all of your recommendations are reflected in the order area. Otherwise contact oncall as below                   Admission History and Physical Examination      Date Time: 04/21/21 2:16 PM  Patient Name: Scott Monroe, Scott Monroe.  Attending Physician: Karma Ganja, MD  Primary Care Physician: Pcp, None, MD    CC: Afib RVR      History of Presenting Illness:   Scott Dematteo. is a 76 y.o. male who presents to ER with CP and SOB found to be in Afib/ RVR. [facility] Afib RVR was started on Cardizem drip. During that admission he was dxed with systolic CHF with EF 30% with LV thrombus For this reason Cardizem was dced and Metoptolol was started..  In the ER pt received Lopressor    Currently pt is in the ER denies having any CP or SOB. He also complains of frequent urination ("40 times/ day" only small amount Admits in not taking any onf his medications except Eliquis.         Past Medical History:     Past Medical History:   Diagnosis Date    Hyperlipidemia     Hypertension     Insomnia     Irregular heart beat     Restless leg     Sleep apnea      Available old records reviewed,    Past Surgical History:     Past Surgical History:   Procedure Laterality Date    BACK SURGERY      SPINAL CORD STIMULATOR IMPLANT         Family History:   History reviewed. No pertinent family history.  Reviewed    Social History:     Social History     Socioeconomic History    Marital status: Married     Spouse name: Not on file    Number of children: Not on file    Years of education: Not on file    Highest education level: Not on file   Occupational History    Not on file   Tobacco Use    Smoking status: Every Day     Packs/day: 1.00     Types: Cigarettes    Smokeless tobacco: Never   Vaping Use    Vaping status: Never Used   Substance and Sexual Activity    Alcohol use: Never    Drug use: Never    Sexual activity: Not on file   Other Topics Concern    Not on file    Social History Narrative    Not on file     Social Determinants of Health     Financial Resource Strain: Not on file   Food Insecurity: Not on file   Transportation Needs: Not on file   Physical Activity: Not on file   Stress: Not on file   Social Connections: Not on file   Intimate Partner Violence: Not on file   Housing Stability: Not on file     Reviewed    Allergies:     Allergies   Allergen Reactions    Strawberry C [Ascorbate] Hives       Medications:     Patient's Medications   New Prescriptions    No medications on file   Previous Medications    APIXABAN (ELIQUIS) 5 MG  Take 2 tablets (10 mg) by mouth every 12 (twelve) hours for 4 days    APIXABAN (ELIQUIS) 5 MG    Take 1 tablet (5 mg) by mouth every 12 (twelve) hours    ATORVASTATIN (LIPITOR) 80 MG TABLET    Take 1 tablet (80 mg) by mouth nightly    FUROSEMIDE (LASIX) 40 MG TABLET    Take 0.5 tablets (20 mg) by mouth as needed (if you gain 2 lbs in 24h or 5 lbs in 5 days)    GABAPENTIN (NEURONTIN) 300 MG CAPSULE    Take 1 capsule (300 mg) by mouth every 8 (eight) hours    INSULIN NPH-REGULAR 70-30 MIXTURE (HUMULIN 70/30) (70-30) 100 UNIT/ML INJECTION    Inject 30 units with breakfast and 20 units with dinner    INSULIN PEN NEEDLE (BD PEN NEEDLE MICRO U/F) 32G X 6 MM MISC    Use to inject insulin into the skin 2 times per day. Ok to substitute with needle length and/or gauge of choice.    METOPROLOL SUCCINATE XL (TOPROL-XL) 25 MG 24 HR TABLET    Take 1 tablet (25 mg) by mouth 2 (two) times daily    PANTOPRAZOLE (PROTONIX) 40 MG TABLET    Take 1 tablet (40 mg) by mouth every morning before breakfast    TAMSULOSIN (FLOMAX) 0.4 MG CAP    Take 1 capsule (0.4 mg) by mouth Daily after dinner    TRAZODONE (DESYREL) 50 MG TABLET    Take 1 tablet (50 mg) by mouth nightly    VALSARTAN (DIOVAN) 40 MG TABLET    Take 1 tablet (40 mg) by mouth daily   Modified Medications    No medications on file   Discontinued Medications    No medications on file        Review of  Systems:   No headache. No Eyes, Ear, Nose, Throat problem. No chest pain, orthopnea or shortness or breath or cough, no abdominal pain, nausea or vomiting. No pain or weakness in upper or lower extremity, no psychiatric, neurological, endocrine or hematological complaints other than above.    Physical Exam:     Vitals:    04/21/21 1313   BP: 135/81   Pulse: (!) 130   Resp: 22   Temp:    SpO2: 97%       Intake and Output Summary (Last 24 hours) at Date Time  No intake or output data in the 24 hours ending 04/21/21 1416    General: Awake,  no acute distress.  HEENT: Normocephalic, No icter or conjunctival injection  Neck: supple, no lymphadenopathy, no JVD, no carotid bruits  Cardiovascular: S1, S2 regular rhythm and rate, no murmurs, rubs or gallops  Lungs: Bilateral breath sounds, clear to auscultation bilaterally, without wheezing, rhonchi, or rales  Abdomen: soft, non-tender, non-distended; no palpable masses, no hepatosplenomegaly, normoactive bowel sounds, no rebound or guarding  Extremities: no clubbing, cyanosis, or edema  Neuro: Exam grossly Non focal,   Skin: no rashes or lesions noted      Labs:   Labs were personally reviewed    Results       Procedure Component Value Units Date/Time    High Sensitivity Troponin-I [[id number]] Collected: 04/21/21 1218    Specimen: Blood Updated: 04/21/21 1308     hs Troponin-I 29.6 ng/L     Comprehensive metabolic panel [[id number]]  (Abnormal) Collected: 04/21/21 1218    Specimen: Blood Updated: 04/21/21 1302     Glucose 287 mg/dL  BUN 16.0 mg/dL      Creatinine 1.1 mg/dL      Sodium 161 mEq/L      Potassium 4.3 mEq/L      Chloride 100 mEq/L      CO2 25 mEq/L      Calcium 9.4 mg/dL      Protein, Total 6.5 g/dL      Albumin 3.2 g/dL      AST (SGOT) 23 U/L      ALT 21 U/L      Alkaline Phosphatase 146 U/L      Bilirubin, Total 0.8 mg/dL      Globulin 3.3 g/dL      Albumin/Globulin Ratio 1.0     Anion Gap 10.0    GFR [[id number]] Collected: 04/21/21 1218     Updated:  04/21/21 1302     EGFR >60.0       NT-proBNP [[id number]]  (Abnormal) Collected: 04/21/21 1218     Updated: 04/21/21 1255     NT-proBNP 5,638 pg/mL     CBC and differential [[id number]]  (Abnormal) Collected: 04/21/21 1218    Specimen: Blood Updated: 04/21/21 1235     WBC 7.70 x10 3/uL      Hgb 15.0 g/dL      Hematocrit 57.8 %      Platelets 191 x10 3/uL      RBC 5.72 x10 6/uL      MCV 81.1 fL      MCH 26.2 pg      MCHC 32.3 g/dL      RDW 15 %      MPV 9.3 fL      Instrument Absolute Neutrophil Count 4.25 x10 3/uL      Neutrophils 55.2 %      Lymphocytes Automated 27.5 %      Monocytes 9.2 %      Eosinophils Automated 6.5 %      Basophils Automated 1.3 %      Immature Granulocytes 0.3 %      Nucleated RBC 0.0 /100 WBC      Neutrophils Absolute 4.25 x10 3/uL      Lymphocytes Absolute Automated 2.12 x10 3/uL      Monocytes Absolute Automated 0.71 x10 3/uL      Eosinophils Absolute Automated 0.50 x10 3/uL      Basophils Absolute Automated 0.10 x10 3/uL      Immature Granulocytes Absolute 0.02 x10 3/uL      Absolute NRBC 0.00 x10 3/uL             Radiology Results (24 Hour)       Procedure Component Value Units Date/Time    Chest AP Portable [[id number]] Collected: 04/21/21 1236    Order Status: Completed Updated: 04/21/21 1240    Narrative:      HISTORY: Chest pain     COMPARISON: 04/16/2021    FINDINGS:   CARDIAC SILHOUETTE: Stable cardiomegaly. Calcified thoracic aorta.    LUNGS/PLEURA: No consolidation, edema, pleural effusion or pneumothorax.      Impression:        1.No acute pulmonary process.  2.Stable cardiomegaly.    Legrand Pitts, MD  04/21/2021 12:38 PM               Imaging personally reviewed, including:   ECG, tele personally reviewed, including     Assessment/ Plan     Patient Active Problem List   Diagnosis    Atrial fibrillation    Primary osteoarthritis of left  knee    HFrEF (heart failure with reduced ejection fraction)    Uncontrolled type 2 diabetes mellitus with hyperglycemia    Benign prostatic  hyperplasia with incomplete bladder emptying     Afib/ RVR received Lopressor in the ER still tachy resume metoprolol at 50. May need More IV meds Cards consulted    Acute on chronic Systolic CHF EF 30% with leg edema Start lasix Monitor Vol status    LV thrombus Resume ELiquis for 2 more days at 10 mg BID then 5 mg BID    DM resume insulin  SSI Monitor BS and adjust as needed    Urinary frequency/ Dysuria Likely due to BPH Resume Flomax Check UA UC     HTN, slightly elevated. Resume usual meds. Monitor BP and adjust as needed    Elevated Alk Phos,  elevated  check Vit D level. If normal will consider sono liver    Hyperlipidemia.  Continue with Statins.      BPH resume Flomax    Active smoker Advised cessation Nicoderm patch    GERD PPI    OSA will need PSG as outpt     Non compliance with medications due to financial issues. Needs SW consult     DVT PPX Eliquis                Signed by: Karma Ganja, MD, MD   Cc:Pcp, None, MD              *This note was generated by the Epic EMR system/ Dragon speech recognition and may contain inherent errors or omissions not intended by the user. Grammatical errors, random word insertions, deletions, pronoun errors and incomplete sentences are occasional consequences of this technology due to software limitations. Not all errors are caught or corrected. If there are questions or concerns about the content of this note or information contained within the body of this dictation they should be addressed directly with the author for clarification.*

## 2021-04-21 NOTE — Plan of Care (Signed)
Called to emergency room due to cardiology consult for patient who presents with urinary frequency and sensation of inability to empty his bladder fully.    In brief synopsis this is a gentleman recently admitted 4/2 through 04/19/2021 for chest pain and shortness of breath.  During that hospitalization he was found to be in b A-fib with RVR.  Echocardiogram 04/18/2021 d revealed an EF of 30%, with mild concentric LVH, moderate global hypokinesis without regional variation.  There was an LV apical mass measuring 1.1 x 0.7 cm with moderate to severely decreased RV systolic function.  The left atrium was moderately dilated and the right atrium mildly dilated there was mild MR with moderate pulmonary hypertension and an RVSP of 48 mmHg.  He was also noted to have a trivial to small pericardial effusion.  Since his prior echo of 08/20/2020 his EF had declined from 63 to 30%, RV dysfunction was new in the pulmonary hypertension and small pericardial effusion was new.  The patient was initiated on apixaban 5 mg p.o. twice daily.  He was then placed on metoprolol XL 25 mg p.o. daily.    He presented to the emergency room today with complaints of inability to fully empty his bladder and urinating approximately 40 times per day.  He states he was compliant with his apixaban but uncertain regarding the metoprolol.  EKG on presentation revealed atrial fibrillation with RVR with rates in the 150s.  His initialhs troponin was 29.6.  His NT proBNP was 5638.  With his chest x-ray revealing stable cardiomegaly and no acute pulmonary process.  We were asked to consult however on arrival of the consult team stated that he wished to sign out AMA.    He was counseled by his attending physician Dr. BPV as well as the emergency room physician Dr.Avstreih.  He has been ordered furosemide 20 mg IV x1 which he declined.  He also was given metoprolol 5 mg IV x1.  At time of exam he was off monitor.  Most recent readings revealed a heart rate 125  (down from 157 on presentation) his blood pressure remained elevated at 135/93.  He will be discharged to home on metoprolol XL 25 mg twice daily and he is to continue his apixaban 5 mg twice daily.  A long discussion was had with the patient regarding the role of tachycardia mediated cardiomyopathy as well as development of clot and the importance of compliance with these medications.  He verbalized an understanding.  He is aware to continue with this plan for prior follow-up with Dr. Malen Gauze as previously discussed at his discharge on 04/19/2021

## 2021-04-21 NOTE — Discharge Instructions (Addendum)
Please take your metoprolol as prescribed.  Continue your other medications as prescribed.  Return to the hospital for worsening chest pain or shortness of breath.

## 2021-04-25 NOTE — Progress Notes (Signed)
Ambulatory Care Management    Unable to reach patient following 2 attempts since discharge from the hospital-upon review of medical record patient's primary discharge diagnosis was not a Medicare focus diagnosis and will close ACM case at this time-will address any needs if patient or family call back    Final Diagnoses (ICD-10-CM)    Code Description POA CC HAC Affects DRG   I48.0  [P] Paroxysmal atrial fibrillation Yes No  Yes       Collene Mares BSN, RN, CCM  RN Case Manager II  Ambulatory Care Management  [address] [place], [place] [address]  Ph: [phone]

## 2021-05-01 ENCOUNTER — Other Ambulatory Visit: Payer: Self-pay | Admitting: *Deleted

## 2021-05-01 ENCOUNTER — Inpatient Hospital Stay: Payer: Medicare Other | Attending: Hematology & Oncology

## 2021-05-01 ENCOUNTER — Other Ambulatory Visit: Payer: Self-pay | Admitting: Family

## 2021-05-01 ENCOUNTER — Inpatient Hospital Stay: Payer: Medicare Other

## 2021-05-01 DIAGNOSIS — M2550 Pain in unspecified joint: Secondary | ICD-10-CM | POA: Diagnosis not present

## 2021-05-01 DIAGNOSIS — C7951 Secondary malignant neoplasm of bone: Secondary | ICD-10-CM

## 2021-05-01 DIAGNOSIS — R531 Weakness: Secondary | ICD-10-CM | POA: Insufficient documentation

## 2021-05-01 DIAGNOSIS — Z79899 Other long term (current) drug therapy: Secondary | ICD-10-CM | POA: Diagnosis not present

## 2021-05-01 DIAGNOSIS — C61 Malignant neoplasm of prostate: Secondary | ICD-10-CM | POA: Insufficient documentation

## 2021-05-01 DIAGNOSIS — Z95828 Presence of other vascular implants and grafts: Secondary | ICD-10-CM

## 2021-05-01 DIAGNOSIS — C7A8 Other malignant neuroendocrine tumors: Secondary | ICD-10-CM

## 2021-05-01 DIAGNOSIS — D508 Other iron deficiency anemias: Secondary | ICD-10-CM | POA: Diagnosis not present

## 2021-05-01 DIAGNOSIS — K909 Intestinal malabsorption, unspecified: Secondary | ICD-10-CM | POA: Diagnosis not present

## 2021-05-01 DIAGNOSIS — D649 Anemia, unspecified: Secondary | ICD-10-CM

## 2021-05-01 LAB — CMP (CANCER CENTER ONLY)
ALT: 13 U/L (ref 0–44)
AST: 14 U/L — ABNORMAL LOW (ref 15–41)
Albumin: 4.2 g/dL (ref 3.5–5.0)
Alkaline Phosphatase: 75 U/L (ref 38–126)
Anion gap: 8 (ref 5–15)
BUN: 28 mg/dL — ABNORMAL HIGH (ref 8–23)
CO2: 25 mmol/L (ref 22–32)
Calcium: 9.4 mg/dL (ref 8.9–10.3)
Chloride: 102 mmol/L (ref 98–111)
Creatinine: 1.88 mg/dL — ABNORMAL HIGH (ref 0.61–1.24)
GFR, Estimated: 37 mL/min — ABNORMAL LOW (ref >60)
Glucose, Bld: 119 mg/dL — ABNORMAL HIGH (ref 70–99)
Potassium: 4.2 mmol/L (ref 3.5–5.1)
Sodium: 135 mmol/L (ref 135–145)
Total Bilirubin: 0.3 mg/dL (ref 0.3–1.2)
Total Protein: 6.7 g/dL (ref 6.5–8.1)

## 2021-05-01 LAB — CBC WITH DIFFERENTIAL (CANCER CENTER ONLY)
Abs Immature Granulocytes: 0.05 10*3/uL (ref 0.00–0.07)
Basophils Absolute: 0.1 10*3/uL (ref 0.0–0.1)
Basophils Relative: 1 %
Eosinophils Absolute: 0.2 10*3/uL (ref 0.0–0.5)
Eosinophils Relative: 4 %
HCT: 25.4 % — ABNORMAL LOW (ref 39.0–52.0)
Hemoglobin: 8.3 g/dL — ABNORMAL LOW (ref 13.0–17.0)
Immature Granulocytes: 1 %
Lymphocytes Relative: 18 %
Lymphs Abs: 1.1 10*3/uL (ref 0.7–4.0)
MCH: 33.6 pg (ref 26.0–34.0)
MCHC: 32.7 g/dL (ref 30.0–36.0)
MCV: 102.8 fL — ABNORMAL HIGH (ref 80.0–100.0)
Monocytes Absolute: 0.9 10*3/uL (ref 0.1–1.0)
Monocytes Relative: 15 %
Neutro Abs: 3.9 10*3/uL (ref 1.7–7.7)
Neutrophils Relative %: 61 %
Platelet Count: 275 10*3/uL (ref 150–400)
RBC: 2.47 MIL/uL — ABNORMAL LOW (ref 4.22–5.81)
RDW: 16.7 % — ABNORMAL HIGH (ref 11.5–15.5)
WBC Count: 6.2 10*3/uL (ref 4.0–10.5)
nRBC: 0 % (ref 0.0–0.2)

## 2021-05-01 LAB — MAGNESIUM: Magnesium: 2.3 mg/dL (ref 1.7–2.4)

## 2021-05-01 LAB — SAMPLE TO BLOOD BANK

## 2021-05-01 LAB — PREPARE RBC (CROSSMATCH)

## 2021-05-01 MED ORDER — SODIUM CHLORIDE 0.9% FLUSH
10.0000 mL | Freq: Once | INTRAVENOUS | Status: AC
  Administered 2021-05-01: 10 mL via INTRAVENOUS

## 2021-05-01 MED ORDER — HEPARIN SOD (PORK) LOCK FLUSH 100 UNIT/ML IV SOLN
500.0000 [IU] | Freq: Once | INTRAVENOUS | Status: AC
  Administered 2021-05-01: 500 [IU] via INTRAVENOUS

## 2021-05-01 NOTE — Patient Instructions (Signed)

## 2021-05-02 ENCOUNTER — Inpatient Hospital Stay: Payer: Medicare Other

## 2021-05-02 DIAGNOSIS — M2550 Pain in unspecified joint: Secondary | ICD-10-CM | POA: Diagnosis not present

## 2021-05-02 DIAGNOSIS — C7A8 Other malignant neuroendocrine tumors: Secondary | ICD-10-CM

## 2021-05-02 DIAGNOSIS — K909 Intestinal malabsorption, unspecified: Secondary | ICD-10-CM | POA: Diagnosis not present

## 2021-05-02 DIAGNOSIS — C61 Malignant neoplasm of prostate: Secondary | ICD-10-CM | POA: Diagnosis not present

## 2021-05-02 DIAGNOSIS — Z79899 Other long term (current) drug therapy: Secondary | ICD-10-CM | POA: Diagnosis not present

## 2021-05-02 DIAGNOSIS — R531 Weakness: Secondary | ICD-10-CM | POA: Diagnosis not present

## 2021-05-02 DIAGNOSIS — D649 Anemia, unspecified: Secondary | ICD-10-CM

## 2021-05-02 DIAGNOSIS — D508 Other iron deficiency anemias: Secondary | ICD-10-CM | POA: Diagnosis not present

## 2021-05-02 DIAGNOSIS — C7951 Secondary malignant neoplasm of bone: Secondary | ICD-10-CM

## 2021-05-02 MED ORDER — DIPHENHYDRAMINE HCL 25 MG PO CAPS
25.0000 mg | ORAL_CAPSULE | Freq: Once | ORAL | Status: AC
  Administered 2021-05-02: 25 mg via ORAL
  Filled 2021-05-02: qty 1

## 2021-05-02 MED ORDER — FUROSEMIDE 10 MG/ML IJ SOLN: 20.0000 mg | Freq: Once | INTRAMUSCULAR | Status: DC

## 2021-05-02 MED ORDER — ACETAMINOPHEN 325 MG PO TABS
650.0000 mg | ORAL_TABLET | Freq: Once | ORAL | Status: AC
  Administered 2021-05-02: 650 mg via ORAL
  Filled 2021-05-02: qty 2

## 2021-05-02 MED ORDER — SODIUM CHLORIDE 0.9% FLUSH
10.0000 mL | INTRAVENOUS | Status: AC | PRN
  Administered 2021-05-02: 10 mL

## 2021-05-02 MED ORDER — SODIUM CHLORIDE 0.9% IV SOLUTION
250.0000 mL | Freq: Once | INTRAVENOUS | Status: AC
  Administered 2021-05-02: 250 mL via INTRAVENOUS

## 2021-05-02 MED ORDER — HEPARIN SOD (PORK) LOCK FLUSH 100 UNIT/ML IV SOLN
500.0000 [IU] | Freq: Every day | INTRAVENOUS | Status: AC | PRN
  Administered 2021-05-02: 500 [IU]

## 2021-05-02 NOTE — Patient Instructions (Signed)
Blood Transfusion, Adult A blood transfusion is a procedure in which you receive blood or a type of blood cell (blood component) through an IV. You may need a blood transfusion when your blood level is low. This may result from a bleeding disorder, illness, injury, or surgery. The blood may come from a donor. You may also be able to donate blood for yourself (autologous blood donation) before a planned surgery. The blood given in a transfusion is made up of different blood components. You may receive: Red blood cells. These carry oxygen to the cells in the body. Platelets. These help your blood to clot. Plasma. This is the liquid part of your blood. It carries proteins and other substances throughout the body. White blood cells. These help you fight infections. If you have hemophilia or another clotting disorder, you may also receive other types of blood products. Tell a health care provider about: Any blood disorders you have. Any previous reactions you have had during a blood transfusion. Any allergies you have. All medicines you are taking, including vitamins, herbs, eye drops, creams, and over-the-counter medicines. Any surgeries you have had. Any medical conditions you have, including any recent fever or cold symptoms. Whether you are pregnant or may be pregnant. What are the risks? Generally, this is a safe procedure. However, problems may occur. The most common problems include: A mild allergic reaction, such as red, swollen areas of skin (hives) and itching. Fever or chills. This may be the body's response to new blood cells received. This may occur during or up to 4 hours after the transfusion. More serious problems may include: Transfusion-associated circulatory overload (TACO), or too much fluid in the lungs. This may cause breathing problems. A serious allergic reaction, such as difficulty breathing or swelling around the face and lips. Transfusion-related acute lung injury  (TRALI), which causes breathing difficulty and low oxygen in the blood. This can occur within hours of the transfusion or several days later. Iron overload. This can happen after receiving many blood transfusions over a period of time. Infection or virus being transmitted. This is rare because donated blood is carefully tested before it is given. Hemolytic transfusion reaction. This is rare. It happens when your body's defense system (immune system)tries to attack the new blood cells. Symptoms may include fever, chills, nausea, low blood pressure, and low back or chest pain. Transfusion-associated graft-versus-host disease (TAGVHD). This is rare. It happens when donated cells attack your body's healthy tissues. What happens before the procedure? Medicines Ask your health care provider about: Changing or stopping your regular medicines. This is especially important if you are taking diabetes medicines or blood thinners. Taking medicines such as aspirin and ibuprofen. These medicines can thin your blood. Do not take these medicines unless your health care provider tells you to take them. Taking over-the-counter medicines, vitamins, herbs, and supplements. General instructions Follow instructions from your health care provider about eating and drinking restrictions. You will have a blood test to determine your blood type. This is necessary to know what kind of blood your body will accept and to match it to the donor blood. If you are going to have a planned surgery, you may be able to do an autologous blood donation. This may be done in case you need to have a transfusion. You will have your temperature, blood pressure, and pulse monitored before the transfusion. If you have had an allergic reaction to a transfusion in the past, you may be given medicine to help prevent   a reaction. This medicine may be given to you by mouth (orally) or through an IV. Set aside time for the blood transfusion. This  procedure generally takes 1-4 hours to complete. What happens during the procedure?  An IV will be inserted into one of your veins. The bag of donated blood will be attached to your IV. The blood will then enter through your vein. Your temperature, blood pressure, and pulse will be monitored regularly during the transfusion. This monitoring is done to detect early signs of a transfusion reaction. Tell your nurse right away if you have any of these symptoms during the transfusion: Shortness of breath or trouble breathing. Chest or back pain. Fever or chills. Hives or itching. If you have any signs or symptoms of a reaction, your transfusion will be stopped and you may be given medicine. When the transfusion is complete, your IV will be removed. Pressure may be applied to the IV site for a few minutes. A bandage (dressing)will be applied. The procedure may vary among health care providers and hospitals. What happens after the procedure? Your temperature, blood pressure, pulse, breathing rate, and blood oxygen level will be monitored until you leave the hospital or clinic. Your blood may be tested to see how you are responding to the transfusion. You may be warmed with fluids or blankets to maintain a normal body temperature. If you receive your blood transfusion in an outpatient setting, you will be told whom to contact to report any reactions. Where to find more information For more information on blood transfusions, visit the [employer]: redcross.org Summary A blood transfusion is a procedure in which you receive blood or a type of blood cell (blood component) through an IV. The blood you receive may come from a donor or be donated by yourself (autologous blood donation) before a planned surgery. The blood given in a transfusion is made up of different blood components. You may receive red blood cells, platelets, plasma, or white blood cells depending on the condition treated. Your  temperature, blood pressure, and pulse will be monitored before, during, and after the transfusion. After the transfusion, your blood may be tested to see how your body has responded. This information is not intended to replace advice given to you by your health care provider. Make sure you discuss any questions you have with your health care provider. Document Revised: 11/06/2018 Document Reviewed: 06/26/2018 Elsevier Patient Education  2023 Elsevier Inc.  

## 2021-05-03 LAB — BPAM RBC
Blood Product Expiration Date: 202305182359
Blood Product Expiration Date: 202305182359
ISSUE DATE / TIME: 202304180805
ISSUE DATE / TIME: 202304180805
Unit Type and Rh: 5100
Unit Type and Rh: 5100

## 2021-05-03 LAB — TYPE AND SCREEN
ABO/RH(D): O POS
Antibody Screen: NEGATIVE
Unit division: 0
Unit division: 0

## 2021-05-10 ENCOUNTER — Encounter: Payer: Self-pay | Admitting: Hematology & Oncology

## 2021-05-10 ENCOUNTER — Other Ambulatory Visit (HOSPITAL_BASED_OUTPATIENT_CLINIC_OR_DEPARTMENT_OTHER): Payer: Self-pay

## 2021-05-10 ENCOUNTER — Other Ambulatory Visit: Payer: Self-pay | Admitting: Hematology & Oncology

## 2021-05-10 MED ORDER — PANTOPRAZOLE SODIUM 40 MG PO TBEC
DELAYED_RELEASE_TABLET | Freq: Every day | ORAL | 6 refills | Status: AC
Start: 2021-05-10 — End: 2022-05-10
  Filled 2021-05-10: qty 30, 30d supply, fill #0

## 2021-05-16 DIAGNOSIS — M9903 Segmental and somatic dysfunction of lumbar region: Secondary | ICD-10-CM | POA: Diagnosis not present

## 2021-05-16 DIAGNOSIS — M9901 Segmental and somatic dysfunction of cervical region: Secondary | ICD-10-CM | POA: Diagnosis not present

## 2021-05-16 DIAGNOSIS — M9904 Segmental and somatic dysfunction of sacral region: Secondary | ICD-10-CM | POA: Diagnosis not present

## 2021-05-16 DIAGNOSIS — M9902 Segmental and somatic dysfunction of thoracic region: Secondary | ICD-10-CM | POA: Diagnosis not present

## 2021-05-17 ENCOUNTER — Other Ambulatory Visit: Payer: Self-pay

## 2021-05-17 ENCOUNTER — Inpatient Hospital Stay: Payer: Medicare Other | Attending: Hematology & Oncology

## 2021-05-17 ENCOUNTER — Encounter: Payer: Self-pay | Admitting: Hematology & Oncology

## 2021-05-17 ENCOUNTER — Inpatient Hospital Stay: Payer: Medicare Other

## 2021-05-17 ENCOUNTER — Inpatient Hospital Stay (HOSPITAL_BASED_OUTPATIENT_CLINIC_OR_DEPARTMENT_OTHER): Payer: Medicare Other | Admitting: Hematology & Oncology

## 2021-05-17 VITALS — BP 124/78 | HR 101 | Temp 97.5°F | Resp 18 | Ht 68.0 in | Wt 139.1 lb

## 2021-05-17 DIAGNOSIS — C7A8 Other malignant neuroendocrine tumors: Secondary | ICD-10-CM | POA: Insufficient documentation

## 2021-05-17 DIAGNOSIS — C7951 Secondary malignant neoplasm of bone: Secondary | ICD-10-CM

## 2021-05-17 DIAGNOSIS — D508 Other iron deficiency anemias: Secondary | ICD-10-CM | POA: Diagnosis not present

## 2021-05-17 DIAGNOSIS — C61 Malignant neoplasm of prostate: Secondary | ICD-10-CM | POA: Insufficient documentation

## 2021-05-17 DIAGNOSIS — K909 Intestinal malabsorption, unspecified: Secondary | ICD-10-CM | POA: Insufficient documentation

## 2021-05-17 DIAGNOSIS — Z79899 Other long term (current) drug therapy: Secondary | ICD-10-CM | POA: Insufficient documentation

## 2021-05-17 DIAGNOSIS — Z5111 Encounter for antineoplastic chemotherapy: Secondary | ICD-10-CM | POA: Diagnosis not present

## 2021-05-17 LAB — IRON AND IRON BINDING CAPACITY (CC-WL,HP ONLY)
Iron: 97 ug/dL (ref 45–182)
Saturation Ratios: 42 % — ABNORMAL HIGH (ref 17.9–39.5)
TIBC: 231 ug/dL — ABNORMAL LOW (ref 250–450)
UIBC: 134 ug/dL (ref 117–376)

## 2021-05-17 LAB — CMP (CANCER CENTER ONLY)
ALT: 10 U/L (ref 0–44)
AST: 18 U/L (ref 15–41)
Albumin: 3.8 g/dL (ref 3.5–5.0)
Alkaline Phosphatase: 62 U/L (ref 38–126)
Anion gap: 8 (ref 5–15)
BUN: 33 mg/dL — ABNORMAL HIGH (ref 8–23)
CO2: 23 mmol/L (ref 22–32)
Calcium: 9.3 mg/dL (ref 8.9–10.3)
Chloride: 106 mmol/L (ref 98–111)
Creatinine: 1.87 mg/dL — ABNORMAL HIGH (ref 0.61–1.24)
GFR, Estimated: 37 mL/min — ABNORMAL LOW (ref >60)
Glucose, Bld: 135 mg/dL — ABNORMAL HIGH (ref 70–99)
Potassium: 4.2 mmol/L (ref 3.5–5.1)
Sodium: 137 mmol/L (ref 135–145)
Total Bilirubin: 0.4 mg/dL (ref 0.3–1.2)
Total Protein: 6.7 g/dL (ref 6.5–8.1)

## 2021-05-17 LAB — CBC WITH DIFFERENTIAL (CANCER CENTER ONLY)
Abs Immature Granulocytes: 0.02 10*3/uL (ref 0.00–0.07)
Basophils Absolute: 0 10*3/uL (ref 0.0–0.1)
Basophils Relative: 1 %
Eosinophils Absolute: 0.2 10*3/uL (ref 0.0–0.5)
Eosinophils Relative: 2 %
HCT: 34.4 % — ABNORMAL LOW (ref 39.0–52.0)
Hemoglobin: 11.4 g/dL — ABNORMAL LOW (ref 13.0–17.0)
Immature Granulocytes: 0 %
Lymphocytes Relative: 14 %
Lymphs Abs: 1.1 10*3/uL (ref 0.7–4.0)
MCH: 31.6 pg (ref 26.0–34.0)
MCHC: 33.1 g/dL (ref 30.0–36.0)
MCV: 95.3 fL (ref 80.0–100.0)
Monocytes Absolute: 0.9 10*3/uL (ref 0.1–1.0)
Monocytes Relative: 11 %
Neutro Abs: 6 10*3/uL (ref 1.7–7.7)
Neutrophils Relative %: 72 %
Platelet Count: 291 10*3/uL (ref 150–400)
RBC: 3.61 MIL/uL — ABNORMAL LOW (ref 4.22–5.81)
RDW: 17.8 % — ABNORMAL HIGH (ref 11.5–15.5)
WBC Count: 8.2 10*3/uL (ref 4.0–10.5)
nRBC: 0 % (ref 0.0–0.2)

## 2021-05-17 LAB — MAGNESIUM: Magnesium: 2.3 mg/dL (ref 1.7–2.4)

## 2021-05-17 LAB — FERRITIN: Ferritin: 1138 ng/mL — ABNORMAL HIGH (ref 24–336)

## 2021-05-17 LAB — LACTATE DEHYDROGENASE: LDH: 141 U/L (ref 98–192)

## 2021-05-17 LAB — SAMPLE TO BLOOD BANK

## 2021-05-17 MED ORDER — SODIUM CHLORIDE 0.9 % IV SOLN
150.0000 mg | Freq: Once | INTRAVENOUS | Status: AC
  Administered 2021-05-17: 150 mg via INTRAVENOUS
  Filled 2021-05-17: qty 150

## 2021-05-17 MED ORDER — COLD PACK MISC ONCOLOGY: 1.0000 | Freq: Once | Status: DC | PRN

## 2021-05-17 MED ORDER — SODIUM CHLORIDE 0.9 % IV SOLN: Freq: Once | INTRAVENOUS | Status: AC

## 2021-05-17 MED ORDER — HEPARIN SOD (PORK) LOCK FLUSH 100 UNIT/ML IV SOLN
500.0000 [IU] | Freq: Once | INTRAVENOUS | Status: AC | PRN
  Administered 2021-05-17: 500 [IU]

## 2021-05-17 MED ORDER — SODIUM CHLORIDE 0.9 % IV SOLN
65.0000 mg/m2 | Freq: Once | INTRAVENOUS | Status: AC
  Administered 2021-05-17: 120 mg via INTRAVENOUS
  Filled 2021-05-17: qty 6

## 2021-05-17 MED ORDER — PALONOSETRON HCL INJECTION 0.25 MG/5ML
0.2500 mg | Freq: Once | INTRAVENOUS | Status: AC
  Administered 2021-05-17: 0.25 mg via INTRAVENOUS
  Filled 2021-05-17: qty 5

## 2021-05-17 MED ORDER — SODIUM CHLORIDE 0.9% FLUSH
10.0000 mL | INTRAVENOUS | Status: DC | PRN
  Administered 2021-05-17: 10 mL

## 2021-05-17 MED ORDER — ATROPINE SULFATE 1 MG/ML IV SOLN
0.5000 mg | Freq: Once | INTRAVENOUS | Status: AC | PRN
  Administered 2021-05-17: 0.5 mg via INTRAVENOUS
  Filled 2021-05-17: qty 1

## 2021-05-17 MED ORDER — POTASSIUM CHLORIDE IN NACL 20-0.9 MEQ/L-% IV SOLN
Freq: Once | INTRAVENOUS | Status: AC
  Filled 2021-05-17: qty 1000

## 2021-05-17 MED ORDER — PEGFILGRASTIM 6 MG/0.6ML ~~LOC~~ PSKT
6.0000 mg | PREFILLED_SYRINGE | Freq: Once | SUBCUTANEOUS | Status: AC
  Administered 2021-05-17: 6 mg via SUBCUTANEOUS
  Filled 2021-05-17: qty 0.6

## 2021-05-17 MED ORDER — SODIUM CHLORIDE 0.9 % IV SOLN
50.0000 mg | Freq: Once | INTRAVENOUS | Status: AC
  Administered 2021-05-17: 50 mg via INTRAVENOUS
  Filled 2021-05-17: qty 50

## 2021-05-17 MED ORDER — SODIUM CHLORIDE 0.9 % IV SOLN
10.0000 mg | Freq: Once | INTRAVENOUS | Status: AC
  Administered 2021-05-17: 10 mg via INTRAVENOUS
  Filled 2021-05-17: qty 10

## 2021-05-17 MED ORDER — MAGNESIUM SULFATE 2 GM/50ML IV SOLN
2.0000 g | Freq: Once | INTRAVENOUS | Status: AC
  Administered 2021-05-17: 2 g via INTRAVENOUS
  Filled 2021-05-17: qty 50

## 2021-05-17 NOTE — Progress Notes (Signed)
Per Dr. Marin Olp, okay to treat today despite labs ?

## 2021-05-17 NOTE — Progress Notes (Signed)
Ok to treat with HR 101. ? ?[name] [place], [place], BCPS, BCOP ?05/17/2021 ?9:08 AM ?

## 2021-05-17 NOTE — Progress Notes (Signed)
?Hematology and Oncology Follow Up Visit ? ?NUR [name] ?[id number] ?05-20-1945 76 y.o. ?05/17/2021 ? ? 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  ?Past Therapy: ?Zytiga 1000 mg by mouth daily - discontinued on 08/15/2016 ?Xtandi 160 mg po q day - start on 08/15/2016 - discontinued ?Radium-223 therapy -- s/p cycle #6 ?Palliative radiation to the right sacrum ?Lutathera - s/p cycle 4/4 --last dose on 07/11/2017 ?Carbo/VP-16/Tecentriq --  S/p cycle #5 ?Keytruda q 6 week -- Maintanence -- s/p cycle #3 - changed from 3 to 6 week on 06/30/2018 ?Taxotere/Keytruda -- start on 09/17/2018 -- s/p cycle #10 -- d/c due to progression ?  ?Current Therapy:        ?Lurbinectedin -- started on 04/29/2019, s/p cycle 17 -- d/c on 05/2020 due to progression ?CDDP/Irinotecan -- s/p cycle #11-- start on 06/15/2020 ?Xgeva 120 mg subcutaneous Q3 months - due in 07/2021 ?Lupron 22 mg IM every 3 months - due in 07/2021  ?SBRT to sacral lesion ? ?IV iron as indicated  ?  ?Interim History:  [name] is here today for follow-up.  He and his wife had a wonderful time down in [place].  They went down in April.  They enjoyed themselves.  I am glad that we were able to transfuse him.  This made him feel a whole lot better. ? ?I am little concerned though that the Chromogranin A level was about 1700.  This is quite much higher than before.  I think we will  have to get another nuclear medicine PET scan on him.  His last one was back in February. ? ?He has had no bleeding.  There is been no cough.  He has had no shortness of breath.  He has had no nausea or vomiting.  He has had no rashes.  He does have the brace on the right lower leg.  He has been doing pretty well with ambulation. ? ?He has had no headache. ? ?Overall, I would have said his performance status is probably ECOG 1.   ? ?Medications:  ?Allergies as of  05/17/2021   ? ?   Reactions  ? Codeine Nausea And Vomiting  ? Hydrocodone Nausea And Vomiting  ? ?  ? ?  ?Medication List  ?  ? ?  ? Accurate as of May 17, 2021  8:31 AM. If you have any questions, ask your nurse or doctor.  ?  ?  ? ?  ? ?acetaminophen 500 MG tablet ?Commonly known as: TYLENOL ?Take 1 tablet (500 mg total) by mouth every 4 (four) hours as needed for mild pain, headache or fever (fever >/= 101). ?  ?Anti-Diarrheal 2 MG tablet ?Generic drug: loperamide ?Take 2 tablets by mouth at diarrhea onset, then 1 tablet every 2 hours until 12 hours with no bowel movment. May take 2 tablets every 4 hours at night. If diarrhea recurs repeat. ?  ?ascorbic acid 500 MG tablet ?Commonly known as: VITAMIN C ?Take 1 tablet (500 mg total) by mouth daily. ?  ?aspirin 81 MG tablet ?Take 81 mg by mouth daily. ?  ?dexamethasone 4 MG tablet ?Commonly known as: DECADRON ?Take 2 tablets (8 mg total) by mouth daily for 3 days starting the day after cisplatin chemotherapy. Take with food. ?  ?ezetimibe 10 MG tablet ?Commonly known as: ZETIA ?Take 1 tablet by mouth daily ?  ?Generlac 10 GM/15ML Soln ?Generic drug: lactulose (  encephalopathy) ?Take 15 mLs (10 g total) by mouth daily as needed for constipation. ?  ?isosorbide mononitrate 30 MG 24 hr tablet ?Commonly known as: IMDUR ?TAKE 1 TABLET (30 MG TOTAL) BY MOUTH DAILY. ?What changed: how much to take ?  ?lidocaine-prilocaine cream ?Commonly known as: EMLA ?Apply to affected area once ?What changed:  ?how much to take ?how to take this ?when to take this ?additional instructions ?  ?ondansetron 8 MG tablet ?Commonly known as: Zofran ?Take 1 tablet (8 mg total) by mouth 2 (two) times daily as needed. Start on the third day after cisplatin chemotherapy. ?  ?OVER THE COUNTER MEDICATION ?1 each by Other route See admin instructions. Juice Plus -- 8 capsule of each the garden, vineyard, and orchard twice a day. ?  ?pantoprazole 40 MG tablet ?Commonly known as: PROTONIX ?TAKE 1 TABLET  (40 MG TOTAL) BY MOUTH AT BEDTIME. ?  ?prochlorperazine 10 MG tablet ?Commonly known as: COMPAZINE ?Take 1 tablet (10 mg total) by mouth every 6 (six) hours as needed for nausea or vomiting. ?  ?solifenacin 10 MG tablet ?Commonly known as: VESIcare ?Take 1 tablet (10 mg total) by mouth daily. ?  ? ?  ? ? ?Allergies:  ?Allergies  ?Allergen Reactions  ? Codeine Nausea And Vomiting  ? Hydrocodone Nausea And Vomiting  ? ? ?Past Medical History, Surgical history, Social history, and Family History were reviewed and updated. ? ?Review of Systems: ?Review of Systems  ?Constitutional: Negative.   ?HENT: Negative.    ?Eyes: Negative.   ?Respiratory: Negative.    ?Cardiovascular: Negative.   ?Gastrointestinal: Negative.   ?Genitourinary: Negative.   ?Musculoskeletal:  Positive for joint pain.  ?Skin: Negative.   ?Neurological:  Positive for focal weakness.  ?Endo/Heme/Allergies: Negative.   ?Psychiatric/Behavioral: Negative.    ? ? ?Physical Exam: ? height is 5\' 8"  (1.727 m) and weight is 139 lb 1.9 oz (63.1 kg). His oral temperature is 97.5 ?F (36.4 ?C) (abnormal). His blood pressure is 124/78 and his pulse is 101 (abnormal). His respiration is 18 and oxygen saturation is 100%.  ? ?Wt Readings from Last 3 Encounters:  ?05/17/21 139 lb 1.9 oz (63.1 kg)  ?05/02/21 141 lb 8 oz (64.2 kg)  ?04/12/21 138 lb (62.6 kg)  ? ? ?Physical Exam ?Vitals reviewed.  ?HENT:  ?   Head: Normocephalic and atraumatic.  ?Eyes:  ?   Pupils: Pupils are equal, round, and reactive to light.  ?Cardiovascular:  ?   Rate and Rhythm: Normal rate and regular rhythm.  ?   Heart sounds: Normal heart sounds.  ?Pulmonary:  ?   Effort: Pulmonary effort is normal.  ?   Breath sounds: Normal breath sounds.  ?Abdominal:  ?   General: Bowel sounds are normal.  ?   Palpations: Abdomen is soft.  ?Musculoskeletal:     ?   General: No tenderness or deformity. Normal range of motion.  ?   Cervical back: Normal range of motion.  ?Lymphadenopathy:  ?   Cervical: No  cervical adenopathy.  ?Skin: ?   General: Skin is warm and dry.  ?   Findings: No erythema or rash.  ?Neurological:  ?   Mental Status: He is alert and oriented to person, place, and time.  ?Psychiatric:     ?   Behavior: Behavior normal.     ?   Thought Content: Thought content normal.     ?   Judgment: Judgment normal.  ? ?Lab Results  ?Component Value Date  ?  WBC 8.2 05/17/2021  ? HGB 11.4 (L) 05/17/2021  ? HCT 34.4 (L) 05/17/2021  ? MCV 95.3 05/17/2021  ? PLT 291 05/17/2021  ? ?Lab Results  ?Component Value Date  ? FERRITIN 1,473 (H) 04/12/2021  ? IRON 88 04/12/2021  ? TIBC 242 (L) 04/12/2021  ? UIBC 154 04/12/2021  ? IRONPCTSAT 36 04/12/2021  ? ?Lab Results  ?Component Value Date  ? RETICCTPCT 1.5 04/12/2021  ? RBC 3.61 (L) 05/17/2021  ? ?No results found for: KPAFRELGTCHN, LAMBDASER, KAPLAMBRATIO ?No results found for: IGGSERUM, IGA, IGMSERUM ?No results found for: TOTALPROTELP, ALBUMINELP, A1GS, A2GS, BETS, BETA2SER, GAMS, MSPIKE, SPEI ?  Chemistry   ?   ?Component Value Date/Time  ? NA 135 05/01/2021 1055  ? NA 139 10/19/2020 1444  ? NA 144 12/14/2016 1159  ? NA 140 10/22/2016 1129  ? K 4.2 05/01/2021 1055  ? K 3.5 12/14/2016 1159  ? K 4.3 10/22/2016 1129  ? CL 102 05/01/2021 1055  ? CL 103 12/14/2016 1159  ? CO2 25 05/01/2021 1055  ? CO2 30 12/14/2016 1159  ? CO2 27 10/22/2016 1129  ? BUN 28 (H) 05/01/2021 1055  ? BUN 17 10/19/2020 1444  ? BUN 16 12/14/2016 1159  ? BUN 14.0 10/22/2016 1129  ? CREATININE 1.88 (H) 05/01/2021 1055  ? CREATININE 1.3 (H) 12/14/2016 1159  ? CREATININE 1.1 10/22/2016 1129  ?    ?Component Value Date/Time  ? CALCIUM 9.4 05/01/2021 1055  ? CALCIUM 10.0 12/14/2016 1159  ? CALCIUM 10.4 10/22/2016 1129  ? ALKPHOS 75 05/01/2021 1055  ? ALKPHOS 48 12/14/2016 1159  ? ALKPHOS 60 10/22/2016 1129  ? AST 14 (L) 05/01/2021 1055  ? AST 16 10/22/2016 1129  ? ALT 13 05/01/2021 1055  ? ALT 23 12/14/2016 1159  ? ALT 14 10/22/2016 1129  ? BILITOT 0.3 05/01/2021 1055  ? BILITOT 0.31 10/22/2016 1129   ?  ? ? ? ?Impression and Plan: Mr. Mcnelly is 76 yo caucasian gentleman with metastatic neuroendocrine carcinoma of unknown primary.   ? ?Again, I am worried about the Chromogranin A level.  It will be inter

## 2021-05-17 NOTE — Patient Instructions (Signed)
[facility]  Discharge Instructions: ?Thank you for choosing [facility] to provide your oncology and hematology care.  ? ?If you have a lab appointment with the [facility], please go directly to the [facility] and check in at the registration area. ? ?Wear comfortable clothing and clothing appropriate for easy access to any Portacath or PICC line.  ? ?We strive to give you quality time with your provider. You may need to reschedule your appointment if you arrive late (15 or more minutes).  Arriving late affects you and other patients whose appointments are after yours.  Also, if you miss three or more appointments without notifying the office, you may be dismissed from the clinic at the provider?s discretion.    ?  ?For prescription refill requests, have your pharmacy contact our office and allow 72 hours for refills to be completed.   ? ?Today you received the following chemotherapy and/or immunotherapy agents cpt-11, cisplatin    ?  ?To help prevent nausea and vomiting after your treatment, we encourage you to take your nausea medication as directed. ? ?BELOW ARE SYMPTOMS THAT SHOULD BE REPORTED IMMEDIATELY: ?*FEVER GREATER THAN 100.4 F (38 ?C) OR HIGHER ?*CHILLS OR SWEATING ?*NAUSEA AND VOMITING THAT IS NOT CONTROLLED WITH YOUR NAUSEA MEDICATION ?*UNUSUAL SHORTNESS OF BREATH ?*UNUSUAL BRUISING OR BLEEDING ?*URINARY PROBLEMS (pain or burning when urinating, or frequent urination) ?*BOWEL PROBLEMS (unusual diarrhea, constipation, pain near the anus) ?TENDERNESS IN MOUTH AND THROAT WITH OR WITHOUT PRESENCE OF ULCERS (sore throat, sores in mouth, or a toothache) ?UNUSUAL RASH, SWELLING OR PAIN  ?UNUSUAL VAGINAL DISCHARGE OR ITCHING  ? ?Items with * indicate a potential emergency and should be followed up as soon as possible or go to the Emergency Department if any problems should occur. ? ?Please show the CHEMOTHERAPY ALERT CARD or IMMUNOTHERAPY ALERT CARD at check-in  to the Emergency Department and triage nurse. ?Should you have questions after your visit or need to cancel or reschedule your appointment, please contact [facility]  [phone] and follow the prompts.  Office hours are 8:00 a.m. to 4:30 p.m. Monday - Friday. Please note that voicemails left after 4:00 p.m. may not be returned until the following business day.  We are closed weekends and major holidays. You have access to a nurse at all times for urgent questions. Please call the main number to the clinic [phone] and follow the prompts. ? ?For any non-urgent questions, you may also contact your provider using MyChart. We now offer e-Visits for anyone 25 and older to request care online for non-urgent symptoms. For details visit [email or web address]. ?  ?Also download the MyChart app! Go to the app store, search "MyChart", open the app, select [facility], and log in with your MyChart username and password. ? ?Due to Covid, a mask is required upon entering the hospital/clinic. If you do not have a mask, one will be given to you upon arrival. For doctor visits, patients may have 1 support person aged 72 or older with them. For treatment visits, patients cannot have anyone with them due to current Covid guidelines and our immunocompromised population.  ?

## 2021-05-17 NOTE — Patient Instructions (Signed)

## 2021-05-18 ENCOUNTER — Telehealth: Payer: Self-pay

## 2021-05-18 DIAGNOSIS — J80 Acute respiratory distress syndrome: Secondary | ICD-10-CM | POA: Diagnosis not present

## 2021-05-18 DIAGNOSIS — R404 Transient alteration of awareness: Secondary | ICD-10-CM | POA: Diagnosis not present

## 2021-05-18 DIAGNOSIS — R739 Hyperglycemia, unspecified: Secondary | ICD-10-CM | POA: Diagnosis not present

## 2021-05-18 DIAGNOSIS — R7309 Other abnormal glucose: Secondary | ICD-10-CM | POA: Diagnosis not present

## 2021-05-18 DIAGNOSIS — R069 Unspecified abnormalities of breathing: Secondary | ICD-10-CM | POA: Diagnosis not present

## 2021-05-18 DIAGNOSIS — I469 Cardiac arrest, cause unspecified: Secondary | ICD-10-CM | POA: Diagnosis not present

## 2021-05-18 DIAGNOSIS — R410 Disorientation, unspecified: Secondary | ICD-10-CM | POA: Diagnosis not present

## 2021-05-19 LAB — CHROMOGRANIN A: Chromogranin A (ng/mL): 1005 ng/mL — ABNORMAL HIGH (ref 0.0–101.8)

## 2021-06-14 ENCOUNTER — Ambulatory Visit: Payer: Medicare Other

## 2021-06-14 ENCOUNTER — Other Ambulatory Visit: Payer: Medicare Other

## 2021-06-14 ENCOUNTER — Ambulatory Visit: Payer: Medicare Other | Admitting: Hematology & Oncology

## 2021-06-14 ENCOUNTER — Inpatient Hospital Stay: Payer: Medicare Other

## 2021-06-15 NOTE — Telephone Encounter (Signed)
Received call from pt's wife, Freda Munro reporting that pt passed away this morning. Per wife pt's "heart gave out." Call transferred to Dr Marin Olp. Flowers ordered.  ?

## 2021-06-15 DEATH — deceased

## 2021-07-27 ENCOUNTER — Ambulatory Visit: Payer: Medicare Other | Admitting: Cardiology

## 2022-02-11 NOTE — Progress Notes (Signed)
 -------------------------------------------------------------------------------  Attestation signed by Francis Seip, MD at 02/11/22 1155  I have personally reviewed the patient's history and 24-hour interval events, along with medications, vitals, labs, radiology images and additional findings found in detail within our team notes from APP's and nursing, with their care plans developed with and reviewed by me.     All complex decisions relevant to the patient's care including reviewing diagnosis, management, options and risks associated with the recommended management plans were directly discussed with me.     The [facility], APP involved in the care of this patient along with me is Katherne Loron, NP.    -------------------------------------------------------------------------------        [facility] Progress Note  NP/PA Spectra  [phone]  No prior cardiologist       Date/Time: 02/11/2022 09:36     Patient Name: Scott Monroe.    Assessment and Plan:     Atrial Fibrillation  Chas2Vasc at least 4  Likely persistent or permanent AFib. Patient reports afib has been diagnosed a long time ago but that he is unable to see a heart specialist outside the hospital due to financial contraints. Recently admitted to [facility] 12/2021 for AFIB RVR and CHF exacerbation. This admission (02/09/22), presents w/ similar symptoms of palpitations, SOB, DOE, LE swelling and found to be in Afib RVR and CHF exacerbation. Patient not taking any medications as outpatient due to inability to afford any medications.      On admission, EKG shows 147 bpm, chronic RBBB. Appears mildly volume up on exam.     -s/p cardizem  gtt and IV diuresis  -HR elevated up to 120s today 1/28, BPs 100/80s  -Toprol  increased to 150mg  daily   - IV digoxin loading today and transition to PO digoxin 0.125 tomorrow (K 3.8, Cr 1.2)  -Eliquis  5 mg BID here, however he will not be able to afford this medication on discharge and coumadin is not option given his  inability to get INR tracked  --recommend case management to explore resources. Per patient, he believes he now has active [facility] coverage  -02/10/22: K is 3.7, Mg 1.6. Please replete w/ goal of K>4, Mg>2  -continue tele monitoring        Acute on Chronic HFrEF (30% 04/2021  Previously 63% 08/2020)  Presented with SOB, PND, LE edema in the setting of Afib with RVR. Recent admission for CHF exacerbation insetting of Afib 12/2021 and was discharged on Lasix  40mg  daily (discharge weight 140lbs). Etiology of progression of CM likely due to inability to be on medication due to cost, however etiology of cardiomyopathy not entirely clear, suspect atrial fibrillation.     On admission, CXR with small R pleural effusion, NT-proBNP 2.1K (was 1.5K prior to [place] last admission). On physical exam, patient w/ non-pitting LE edema    -appears euvolemic today, transition to PO Lasix  40mg  daily (diuresied 4L this admission)  -restarted GDMT: Toprol  150 mg daily, Losartan 25 mg daily   - ARNI and SGLT2-I are cost prohibitive for patient  - Strict I&Os, 1.5L fluid restriction, daily standing weight  - Cardioprotective electrolytes K> 4, Mg >2         Chest Pain  Mild chest tightness on admission. EKG AFib with RVR, chronic RBBB. No significant change from EKG in 04/2021 and 12/2021. Troponin 21 >> 23. No recent ischemic workup.     -currently chest pain free  - Low suspicion of ACS  - OP ischemic evaluation  Hx of LV Thrombus - 04/2021  1.1 x 0.7 cm by TTE 04/2021  Echo 01/15/22 without mention of LV thrombus  AC as above     Pulmonary HTN - moderate by TTE 04/2021  -diuresis as above     DM  A1c is 9.8  Defer to primary team       Plan discussed w/ patient and primary team. We will continue to follow patient.     Patient Active Problem List   Diagnosis   . Atrial fibrillation with RVR   . Left ventricular thrombus   . Diabetes mellitus   . Tobacco abuse   . Uncontrolled hypertension   . Hyperlipidemia   . Ureteral mass   .  Acute urinary retention   . BPH (benign prostatic hyperplasia)   . Noncompliance   . Uncontrolled type 2 diabetes mellitus with hyperglycemia   . Gastroesophageal reflux disease   . Paroxysmal atrial fibrillation   . Indwelling Foley catheter present   . Acute on chronic systolic (congestive) heart failure   . Hyponatremia   . Spinal cord stimulator status   . Chest pain   . Acute on chronic HFrEF (heart failure with reduced ejection fraction)       Subjective:     He denies chest discomfort, , dizziness, lightheadedness, syncope, or palpitations. Reports breathing has improved and back to normal. Denies feeling palpitations.     Past Cardiac History/Testing:     01/15/22 TTE  .  Left Ventricle: Normal cavity size. There is mild concentric hypertrophy present. Ejection fraction is 20 - 25%.  .  Right Ventricle: Normal cavity size. The ejection fraction is moderately reduced.  .  Pericardium: Small pericardial effusion.  .  No significant valve disease.     TTE 04/2021:  LVEF30 %.  mild concentric left ventricular hypertrophy.  Moderate global hypokinesis without regional variation.  Ungraded diastolic filling parameters in atrial fibrillation.  LV apical mass (thrombus), measuring 1.1 x 0.7 cm.  The right ventricular cavity size is normal in size.  Moderate to severely decreased right ventricular systolic function.  The left atrium is moderately dilated.  The right atrium is mildly dilated.  The mitral valve is thickened and calcified. Mild mitral regurgitation.  Moderate PHTN with estimated RVSP of  48 mmHg.  The IVC is dilated with poor inspiratory collapse, consistent with elevated right atrial pressure.  Trivial to small circumferential pericardial effusion visualized.     TTE 08/2020:  LVEF 63 %.  Mildly decreased right ventricular systolic function.  No significant valvular dysfunction.    Physical Exam:     Vitals:    02/11/22 0821   BP: 100/82   Pulse: 116   Resp: 15   Temp: (!) 35.9 C (96.7 F)     Temp  (24hrs), Avg:36.6 C (97.9 F), Min:35.9 C (96.7 F), Max:37 C (98.6 F)    Weight - Scale: 66 kg (145 lb 8.1 oz)    Intake and Output Summary (Last 24 hours) at Date Time    Intake/Output Summary (Last 24 hours) at 02/11/2022 0936  Last data filed at 02/11/2022 0600  Gross per 24 hour   Intake 960 ml   Output 4300 ml   Net -3340 ml       General:  Well-nourished, well-developed.  Alert and in no apparent distress.  Eyes:  Anicteric sclera.  Pupils equal and round.    ENT:  Hearing grossly intact. Lips moist, color appropriate for race.  Neck:  no  JVD.  Cardiovascular:  irregularly irregular. Normal S1 and S2.  No S3 or S4.  No murmurs or rubs.  PMI non-displaced.  Lungs:  Clear to diminished to auscultation bilaterally. On RA    Abdomen:  Soft, non-tender, non-distended.  No organomegaly.  No pulsatile masses.  Extremities:  2+ bilateral radial and pedal pulses.  non-pitting edema.  Skin:  Warm, dry, and intact. No rashes or lesions on exposed portions of skin.  Psychiatric:  Good insight and judgment.  Normal affect.    Medications:     . sodium chloride        . apixaban   5 mg Oral BID   . aspirin   81 mg Oral Daily at 0900   . atorvaSTATin   40 mg Oral Nightly at 2100   . digoxin  0.25 mg Intravenous Once   . digoxin  0.5 mg Intravenous Once   . furosemide   20 mg Intravenous BID Diuretic   . gabapentin   100 mg Oral TID   . insulin  70-30  6 Units Subcutaneous BID AC   . losartan  25 mg Oral Nightly at 2100   . metFORMIN   500 mg Oral BID w/Meals   . metoprolol  succinate ER  150 mg Oral Daily at 0900   . nicotine    Does not apply BID-0800 and 2000   . nicotine   1 patch Transdermal Daily at 0900   . potassium chloride   40 mEq Oral Daily with Breakfast   . sodium chloride  (PF)  3 mL Intravenous Q8H   . traZODone   50 mg Oral Nightly at 2100       Labs:     Recent Results (from the past 24 hour(s))   POCT Glucose    Collection Time: 02/10/22 12:30 PM   Result Value Ref Range    Poc Glucose 71 (L) 74 - 99 MG/DL   POCT  Glucose    Collection Time: 02/10/22  4:45 PM   Result Value Ref Range    Poc Glucose 233 (H) 74 - 99 MG/DL   POCT Glucose    Collection Time: 02/10/22  9:11 PM   Result Value Ref Range    Poc Glucose 145 (H) 74 - 99 MG/DL   Basic metabolic panel    Collection Time: 02/11/22  3:51 AM   Result Value Ref Range    Sodium 135 (L) 136 - 145 mmol/L    Potassium 3.8 3.4 - 4.8 mmol/L    Chloride 100 98 - 107 MMOL/L    CO2 19 (L) 23 - 31 MMOL/L    Bun 34 (H) 8 - 26 mg/dL    Creatinine 1.2 0.7 - 1.3 mg/dL    Glucose 828 (H) 74 - 99 mg/dL    Calcium  9.2 8.5 - 10.5 mg/dL    Anion Gap 16 (H) 5 - 15 mmol/L    est GFRcr 63 >=60 mL/min/1.79m2   CBC & PLT    Collection Time: 02/11/22  3:51 AM   Result Value Ref Range    WBC 8.7 5.1 - 10.8 thou/mcL    RBC 5.37 4.05 - 5.64 million/mcL    Hemoglobin 13.6 13.5 - 17.5 G/DL    Hematocrit 56.7 59.4 - 52.5 %    MCV 80.4 (L) 83.0 - 97.0 FL    MCH 25.3 (L) 28.0 - 33.0 PG    MCHC 31.5 (L) 32.0 - 36.0 gm/dL    Platelets 805 849 - 400 K/UL    RDW-SD 47.5 (H) 36 -  47 fL    MPV 9.8 8.9 - 11.2 FL    NRBC ABSOLUTE 0.000   thou/mcL    NRBC% 0 0 /100WBC   POCT Glucose    Collection Time: 02/11/22  8:19 AM   Result Value Ref Range    Poc Glucose 204 (H) 74 - 99 MG/DL       Rads:     Recent Results (from the past 24 hour(s))   POCT Glucose    Collection Time: 02/10/22 12:30 PM   Result Value Ref Range    Poc Glucose 71 (L) 74 - 99 MG/DL   POCT Glucose    Collection Time: 02/10/22  4:45 PM   Result Value Ref Range    Poc Glucose 233 (H) 74 - 99 MG/DL   POCT Glucose    Collection Time: 02/10/22  9:11 PM   Result Value Ref Range    Poc Glucose 145 (H) 74 - 99 MG/DL   Basic metabolic panel    Collection Time: 02/11/22  3:51 AM   Result Value Ref Range    Sodium 135 (L) 136 - 145 mmol/L    Potassium 3.8 3.4 - 4.8 mmol/L    Chloride 100 98 - 107 MMOL/L    CO2 19 (L) 23 - 31 MMOL/L    Bun 34 (H) 8 - 26 mg/dL    Creatinine 1.2 0.7 - 1.3 mg/dL    Glucose 828 (H) 74 - 99 mg/dL    Calcium  9.2 8.5 - 10.5 mg/dL     Anion Gap 16 (H) 5 - 15 mmol/L    est GFRcr 63 >=60 mL/min/1.90m2   CBC & PLT    Collection Time: 02/11/22  3:51 AM   Result Value Ref Range    WBC 8.7 5.1 - 10.8 thou/mcL    RBC 5.37 4.05 - 5.64 million/mcL    Hemoglobin 13.6 13.5 - 17.5 G/DL    Hematocrit 56.7 59.4 - 52.5 %    MCV 80.4 (L) 83.0 - 97.0 FL    MCH 25.3 (L) 28.0 - 33.0 PG    MCHC 31.5 (L) 32.0 - 36.0 gm/dL    Platelets 805 849 - 400 K/UL    RDW-SD 47.5 (H) 36 - 47 fL    MPV 9.8 8.9 - 11.2 FL    NRBC ABSOLUTE 0.000   thou/mcL    NRBC% 0 0 /100WBC   POCT Glucose    Collection Time: 02/11/22  8:19 AM   Result Value Ref Range    Poc Glucose 204 (H) 74 - 99 MG/DL       Cardiographics:     Telemetry: AFib 90-120s

## 2023-01-02 LAB — COLOGUARD: Cologuard Result: NEGATIVE

## 2023-08-29 NOTE — Care Plan (Signed)
 End of Shift Nursing Summary     Significant Events   Pt A&Ox4, VSS throughout shift, SpO2 maintained >95% on RA. Pt denies chest pain, and Pt c/o nausea after eating dinner, PRN zofran  given (see MAR). Pt also c/o r-toe pain PRN oxy and gabapentin  given. NS gtt running @100  ml/hr. No acute events occurred during shift, plan of care ongoing.   Provider Notification       Date/Time Provider Role Provider Name Method of Communication Reason for Communication Response    08/29/23 1053 Consulting Provider  Mliss PA  Face To Face  Other (Comment)*  See Orders          Behavior     Calm and Cooperative    Pain     Pain (last 12 hours)  Including Med, Pain Rating, & Pain Location      Date/Time Action User Medication Dose Pain Rating Pain Location    08/29/23 1053 Given    JP oxyCODONE  (ROXICODONE ) immediate release tablet 5 mg 5 mg 9    Toe (comment which one)   [R-big toe]       08/29/23 0930     0        08/29/23 0917 Given    JP aspirin  chewable tablet 81 mg 81 mg               Elimination          RN Indwelling Urinary Catheter Necessity: Manage acute urinary retention or obstruction       Last BM Date: 08/27/23     Date 08/28/23 1900 - 08/29/23 0659 08/29/23 0700 - 08/30/23 0659   Shift 1900-0659 24 Hour Total 0700-1859 1900-0659 24 Hour Total   INTAKE   P.O. [phone]  1075   I.V.(mL/kg/hr) 1911.9 2910.9      IV Piggyback  100      Shift Total(mL/kg) 2011.9(33.6) 3110.9(52) 1075(18)  1075(18)   OUTPUT   Urine(mL/kg/hr) 2000 2000 950(1.3)  950   Shift Total(mL/kg) 7999(66.5) 7999(66.5) 950(15.9)  950(15.9)   NET 11.9 1110.9 125  125   Weight (kg) 59.8 59.8 59.8 59.8 59.8        Monitoring Orders     Cardiac Monitoring Orders (From admission, onward)       Start     Ordered    08/28/23 1305  Cardiac monitoring  (Cardiac Monitoring Panel- ComH/CPSA)  CONTINUOUS        Question Answer Comment   Level of cardiac monitoring Adult IMU or Adult Acute Care CARDIAC    Cardiac Indications Other (Please specify)    May  travel off unit unmonitored for procedures, testing and/or treatments? May travel unmonitored    May be off monitor for hygiene? May be off monitor for hygiene        08/28/23 1308                      Continuous Pulse Oximetry Order (From admission to next 72h)      None               *Some images could not be shown.

## 2023-08-30 NOTE — Consults (Signed)
 UROLOGY CONSULTATION    Chief Complaint:  Weakness and difficulty urinating    HPI: I have been asked by Dr. Selestine to see Scott Monroe.  who is a 78 y.o. year old male admitted for generalized weakness.  He was found to have hypoglycemia appear a he was also found to have a distended bladder and an elevated serum creatinine.  His bladder was distended in the had bilateral hydronephrosis and possible tumor or other thickening of the inferior bladder on CT scan.  He denies any prior urologic surgery or care.    Medications Ordered Prior to Encounter[1]      Allergies: Ascorbate and Strawberry    Past Medical History[2]     Past Surgical History[3]     Social History     Socioeconomic History   . Marital status: Single     Spouse name: Not on file   . Number of children: Not on file   . Years of education: Not on file   . Highest education level: Not on file   Occupational History   . Not on file   Tobacco Use   . Smoking status: Every Day     Current packs/day: 1.00     Types: Cigarettes   . Smokeless tobacco: Never   Substance and Sexual Activity   . Alcohol use: Not Currently   . Drug use: Not Currently   . Sexual activity: Not Currently   Other Topics Concern   . Not on file   Social History Narrative   . Not on file     Social Drivers of Health     Financial Resource Strain: Low Risk  (01/15/2022)    Overall Financial Resource Strain (CARDIA)    . Difficulty of Paying Living Expenses: Not hard at all   Food Insecurity: Food Insecurity Present (08/29/2023)    Hunger Vital Sign    . Worried About Programme researcher, broadcasting/film/video in the Last Year: Sometimes true    . Ran Out of Food in the Last Year: Sometimes true   Transportation Needs: Unmet Transportation Needs (08/29/2023)    PRAPARE - Transportation    . Lack of Transportation (Medical): Yes    . Lack of Transportation (Non-Medical): Yes   Physical Activity: Inactive (04/22/2021)    Exercise Vital Sign    . Days of Exercise per Week: 0 days    . Minutes of Exercise per  Session: 0 min   Stress: No Stress Concern Present (01/15/2022)    [employer] of Occupational Health - Occupational Stress Questionnaire    . Feeling of Stress : Only a little   Social Connections: Socially Isolated (01/15/2022)    Social Connection and Isolation Panel    . Frequency of Communication with Friends and Family: More than three times a week    . Frequency of Social Gatherings with Friends and Family: Never    . Attends Religious Services: Never    . Active Member of Clubs or Organizations: No    . Attends Banker Meetings: Never    . Marital Status: Separated   Intimate Partner Violence: Not At Risk (08/29/2023)    Humiliation, Afraid, Rape, and Kick questionnaire    . Fear of Current or Ex-Partner: No    . Emotionally Abused: No    . Physically Abused: No    . Sexually Abused: No   Housing Stability: Low Risk  (08/29/2023)    Housing Stability Vital Sign    .  Unable to Pay for Housing in the Last Year: No    . Number of Times Moved in the Last Year: 0    . Homeless in the Last Year: No       No family history on file.    Review of Systems:   Constitutional:  Generalized weakness, no fever or chills  Cardiovascular:  Palpitations, no chest pain  Pulmonary:  Cough  GI:  No nausea or vomiting  GU:  Frequency and urgency and difficulty urinating  Musculoskeletal:  Pain in his right 1st toe with difficulty walking  Integumentary:  Negative  Hematologic/Lymphatic:  Negative  Neurologic:  Negative  Psychiatric:  Negative         Physical Examination:    Visit Vitals  BP 125/60 (BP Location: Left arm, Patient Position: Semi-Fowler's)   Pulse 64   Temp 36.8 C (98.3 F) (Oral)   Resp 16   Wt 59.8 kg (131 lb 13.4 oz)   SpO2 98%   BMI 20.05 kg/m        General: A&O x 3, in NAD  Skin:  Warm and dry  HEENT:  Normocephalic/atraumatic  CV: RRR  Resp: normal effort  Abdomen: Soft, NT/ND, NABS  Back: No CVA tenderness bilaterally  Ext: No C/C/E  Neuro: CN II-XII grossly intact.  Sensorimotor intact UE/LE  bilaterally   GU:           Drains/Tubes - Foley catheter draining amber urine    Laboratory:     Basic Metabolic Profile   Lab Results   Component Value Date    NA 138 08/30/2023    CO2 12 (L) 08/30/2023    BUN 39 (H) 08/30/2023    CALCIUM  7.9 (L) 08/30/2023         CBC   Lab Results   Component Value Date/Time    WBC 7.8 08/30/2023 06:29 AM    RBC 3.84 (L) 08/30/2023 06:29 AM    HCT 32.8 (L) 08/30/2023 06:29 AM    MCV 85.4 08/30/2023 06:29 AM    MCH 28.6 08/30/2023 06:29 AM    MCHC 33.5 08/30/2023 06:29 AM    MCHC 32.9 09/18/2018 05:21 PM    RDW 14.4 06/07/2021 11:56 AM    MPV 9.8 08/30/2023 06:29 AM        Urinalysis   No components found for: SOURCEUR, USPG, UPH, UPSC, UGLU, UKET, UBILIRUBIN, UOCB, UNITR, URINELEUKOC, UUROBILISQ     Cultures:  No growth and urine      Imaging:  CT as noted above which I personally reviewed and agree with the findings      Assessment:  Acute urinary retention with bilateral hydronephrosis.  His serum creatinine has improved considerably since catheter drainage back to near baseline.  He also has a question of a mass versus bladder thickening.  This will need direct visualization in order to rule out tumor as well as to assess his prostate as to whether he would benefit from outlet obstruction surgery.      Plan:  I recommend an outpatient cystoscopy.  I reviewed this with his hospitalist team.  From a urologic standpoint he can be discharged at any time my office will make arrangements for his follow up.          Laurinda Grieve  08/30/2023, 14:21               [1]  No current facility-administered medications on file prior to encounter.     Current Outpatient Medications  on File Prior to Encounter   Medication Sig Dispense Refill   . apixaban  (ELIQUIS ) 5 MG TABS Take 1 tablet by mouth 2 times daily. 180 tablet 3   . aspirin  81 MG EC tablet Take 1 tablet by mouth daily. (Patient taking differently: Take 1 tablet by mouth every morning.) 90 tablet 0   .  atorvaSTATin  (LIPITOR) 40 MG tablet Take 1 tablet by mouth nightly. 90 tablet 0   . baclofen  (LIORESAL ) 10 MG tablet Take 1 tablet by mouth 3 times daily. 30 tablet 0   . BD PEN NEEDLE MICRO U/F 32G X 6 MM MISC      . digoxin (LANOXIN) 125 MCG tablet Take 1 tablet by mouth daily. 90 tablet 3   . furosemide  (LASIX ) 40 MG tablet Take 0.5 tablets by mouth daily. (Patient taking differently: Take 0.5 tablets by mouth as needed (for swelling).) 180 tablet 3   . gabapentin  (NEURONTIN ) 300 MG capsule Take 1 capsule by mouth 3 times daily. (Patient not taking: Reported on 08/28/2023)     . glipiZIDE  (GLUCOTROL ) 5 MG tablet Take 1 tablet by mouth 2 times daily (with meals).     SABRA glucose monitoring kit (FREESTYLE) monitoring kit 1 each as needed for other. 1 each 0   . insulin  70-30 (HUMULIN ) (70-30) 100 UNIT/ML injection Inject 12 Units into the skin 2 times daily (before meals). 10 mL 11   . Insulin  NPH Isophane & Regular (70-30) 100 UNIT/ML SUPN Inject 8 Units into the skin every morning AND 18 Units every evening. 3 mL 0   . losartan (COZAAR) 50 MG tablet Take 1 tablet by mouth daily. (Patient taking differently: Take 1 tablet by mouth daily.) 90 tablet 3   . metFORMIN  (GLUCOPHAGE ) 1000 MG tablet Take 1 tablet by mouth 2 times daily (with meals).  Take with meal     . metoprolol  succinate ER (TOPROL  XL) 100 MG 24 hr tablet Take 1.5 tablets by mouth daily. 90 tablet 0   . Multiple Vitamins-Minerals (MULTIVITAMIN ADULTS 50+) TABS Take 1 tablet by mouth daily.     . nicotine  (NICODERM CQ ) 21 MG/24HR Place 1 patch onto the skin daily.  apply new patch each morning remove old patch 28 patch 0   . tamsulosin  (FLOMAX ) 0.4 MG CAPS Take 1 capsule by mouth daily.     . traMADol (ULTRAM) 50 MG tablet Take 1 tablet by mouth every 6 hours as needed. (Patient taking differently: Take 1 tablet by mouth every 6 hours as needed for pain.) 15 tablet 0   . traZODone  (DESYREL ) 50 MG tablet Take 1 tablet by mouth nightly. 90 tablet 3    [2]  Past Medical History:  . Atrial fibrillation (CMS/HCC)   . Back pain   . Diabetes mellitus (CMS/HCC)   . Hyperlipidemia   . Hypertension   . Spinal cord stimulator status   [3]  Past Surgical History:  Procedure Laterality Date   . BACK SURGERY     . RHINOPLASTY     . TONSILLECTOMY

## 2023-08-31 NOTE — Progress Notes (Signed)
 CASE MANAGEMENT DISCHARGE PLAN      PATIENT: Scott Monroe.                                                              Date: 08/31/2023  MRN: [id number]                                       DISCHARGE DESTINATION:   Discharge Destination  Discharge Destination: Skilled nursing facility  Facility/Agency Name: [facility]  Contact phone: [phone]  Readmission planned within 30 days?: No    TRANSPORT:   Transportation Needs  Type of Discharge Transport needed: Wheelchair English as a second language teacher: Secondary school teacher Name: [employer] [facility] manager Ph#: [phone]   Anticipated date of transportation needed: 08/31/23   Anticipated time of transportation needed: 1500  Transportation Details: Wheelchair Transport/No Oxygen Needed    DISCHARGE SERVICES:                                                                                                         PCP DETAILS:      DAVID GERST  [phone] / [place] [address]  [phone]    DISCHARGE NOTIFICATION:         Discharge Notification  Person notified of anticipated discharge: Majorie Navarro-VM Left  Notified Date: 08/31/23  Notified Time: 1212  Would you like to name someone who will help you at home and receive information at discharge?: Yes    COMMENTS:      Case discussed in IDT rounds. Per provider, patient is medically stable for discharge. Insurance approved sub acute rehab. Patient discharging to [facility] [address]. Report can be called to [phone].    CM called into the room to speak with patient and finalize discharge plan. Transportation to SNF discussed: family vs wheelchair transport. Patient prefers wheelchair transport and acknowledges that insurance does not cover trip. Patient requested CM contact his daughter to provide update. VM left for Jaxyn Rout - [phone]- daughter (MPOA).    Transportation arranged with [employer] [facility] [phone] for a 1500  pick up (wheelchair) Reference# [id number].     Follow-Up Needed:       See AVS      Renda Bunker            [phone]    Case Manager                                Phone/PIC #

## 2023-08-31 NOTE — Care Plan (Signed)
 patient going to [facility] , called to give report to nurse.  Transport company here to take patient there.  patient appears stable and without distress.  all questions answered, sent discharge summary, AVS and face sheet with transport.      End of Shift Nursing Summary     Significant Events     Provider Notification       None         Behavior     Calm and Cooperative    Pain     Pain (last 12 hours)  Including Med, Pain Rating, & Pain Location      Date/Time Action User Medication Dose Pain Rating Pain Location    08/31/23 0840 Given    JJ aspirin  chewable tablet 81 mg 81 mg 6        08/31/23 0840 Given    JJ acetaminophen  (TYLENOL ) tablet 650 mg 650 mg 6        08/31/23 0840 Given    JJ oxyCODONE  (ROXICODONE ) immediate release tablet 5 mg 5 mg 6        08/31/23 0800     4                 Elimination          RN Indwelling Urinary Catheter Necessity: Manage acute urinary retention or obstruction       Last BM Date: 08/27/23     Date 08/30/23 0700 - 08/31/23 0659 08/31/23 0700 - 09/01/23 0659   Shift 0700-1859 1900-0659 24 Hour Total 0700-1859 1900-0659 24 Hour Total   INTAKE   Shift Total(mL/kg)         OUTPUT   Urine(mL/kg/hr) 1000(1.4) 1100(1.7) 2100(1.6)      Shift Total(mL/kg) 1000(16.7) 1100(19.9) 2100(38)      NET -1000 -1100 -2100      Weight (kg) 59.8 55.2 55.2 55.2 55.2 55.2        Monitoring Orders     Cardiac Monitoring Orders (From admission, onward)       Start     Ordered    08/28/23 1305  Cardiac monitoring  (Cardiac Monitoring Panel- ComH/CPSA)  CONTINUOUS        Question Answer Comment   Level of cardiac monitoring Adult IMU or Adult Acute Care CARDIAC    Cardiac Indications Other (Please specify)    May travel off unit unmonitored for procedures, testing and/or treatments? May travel unmonitored    May be off monitor for hygiene? May be off monitor for hygiene        08/28/23 1308                      Continuous Pulse Oximetry Order (From admission to next 72h)      None

## 2023-08-31 NOTE — Discharge Summary (Signed)
 [facility]  HOSPITALIST DISCHARGE SUMMARY    Date of Admission: 08/28/2023  Date of Discharge: 08/31/2023  Code Status: Full Code  Primary Care Provider: DAVID GERST    DISCHARGE DIAGNOSES   Principal Problem:    AKI (acute kidney injury)  Active Problems:    Diabetes mellitus (CMS/HCC)    Tobacco abuse    Bladder mass    Acute urinary retention    BPH (benign prostatic hyperplasia)    Noncompliance    Paroxysmal atrial fibrillation (CMS/HCC)    Spinal cord stimulator status    Hydronephrosis    Hypoglycemia    UTI (urinary tract infection)    Bradycardia    Diabetic neuropathy (CMS/HCC)    Toe pain    COVID      SURGERIES PERFORMED:  * Cannot find OR case *    OTHER PROCEDURES PERFORMED:  none       What to Do With Your Medications        START taking these medications      cefUROXime 250 MG tablet  Quantity: 10 tablet  For diagnoses: Hypoglycemia, unspecified  Commonly known as: CEFTIN  Take 1 tablet by mouth 2 times daily for 5 days.     predniSONE 20 MG tablet  For diagnoses: Hypoglycemia, unspecified  Commonly known as: DELTASONE  Take 1 tablet by mouth daily for 5 days.            CHANGE how you take these medications      aspirin  81 MG EC tablet  Quantity: 90 tablet  For diagnoses: Chest pain, unspecified type  Last time this was given: Ask your nurse or doctor  Last time this was given: Ask your nurse or doctor  Take 1 tablet by mouth daily.  What changed: when to take this            CONTINUE taking these medications      apixaban  5 MG Tabs  Quantity: 180 tablet  For diagnoses: Paroxysmal atrial fibrillation (CMS/HCC)  Commonly known as: ELIQUIS   Take 1 tablet by mouth 2 times daily.     atorvaSTATin  40 MG tablet  Quantity: 90 tablet  For diagnoses: Chest pain, unspecified type  Commonly known as: LIPITOR  Take 1 tablet by mouth nightly.     baclofen  10 MG tablet  Quantity: 30 tablet  For diagnoses: Acute urinary retention  Commonly known as: LIORESAL   Take 1 tablet by mouth 3 times  daily.     BD Pen Needle Micro U/F 32G X 6 MM Misc  Generic drug: Insulin  Pen Needle     Flomax  0.4 MG Caps  Last time this was given: August 31, 2023  8:40 AM  Last time this was given: 0.4 mg on August 31, 2023  8:40 AM  Generic drug: tamsulosin   Take 1 capsule by mouth daily.     furosemide  40 MG tablet  For diagnoses: Hypertension, unspecified type  Commonly known as: LASIX   Take 0.5 tablets by mouth as needed (for swelling).     glucose monitoring kit monitoring kit  Doctor's comments: Sub for any covered brand  Quantity: 1 each  For diagnoses: Type 2 diabetes mellitus with hyperglycemia, with long-term current use of insulin  (CMS/HCC)  1 each as needed for other.     losartan 50 MG tablet  Quantity: 90 tablet  For diagnoses: Hypertension, unspecified type  Commonly known as: COZAAR  Take 1 tablet by mouth daily.     metFORMIN   1000 MG tablet  Commonly known as: GLUCOPHAGE   Take 1 tablet by mouth 2 times daily (with meals).  Take with meal     Multivitamin Adults 50+ Tabs  Take 1 tablet by mouth daily.     nicotine  21 MG/24HR  Quantity: 28 patch  For diagnoses: Tobacco abuse  Commonly known as: NICODERM CQ   Place 1 patch onto the skin daily.  apply new patch each morning remove old patch     traZODone  50 MG tablet  Quantity: 90 tablet  For diagnoses: Paroxysmal atrial fibrillation (CMS/HCC)  Last time this was given: August 30, 2023  8:41 PM  Last time this was given: 50 mg on August 30, 2023  8:41 PM  Commonly known as: DESYREL   Take 1 tablet by mouth nightly.            STOP taking these medications      digoxin 125 MCG tablet  Commonly known as: LANOXIN     gabapentin  300 MG capsule  Commonly known as: NEURONTIN      glipiZIDE  5 MG tablet  Commonly known as: GLUCOTROL      insulin  70-30 (70-30) 100 UNIT/ML injection  Commonly known as: HumuLIN      Insulin  NPH Isophane & Regular (70-30) 100 UNIT/ML Supn     metoprolol  succinate ER 100 MG 24 hr tablet  Commonly known as: Toprol  XL     traMADol 50 MG  tablet  Commonly known as: ULTRAM               Where to Get Your Medications        These medications were sent to [id number] - [place], [place] - [address]  [address] [place] [address]      Hours: 24-hours Phone: [phone]   cefUROXime 250 MG tablet       You can get these medications from any pharmacy    Bring a paper prescription for each of these medications  furosemide  40 MG tablet  predniSONE 20 MG tablet         Please refer to the medication reconciliation for the complete list of discharge medications.    ALLERGIES:    Allergies   Allergen Reactions   . Ascorbate Hives   . Strawberry Hives           POST HOSPITAL CARE     Further Recommendations for Providers:       Patient [place]  [address] [facility]  [address]  [address]  [place] [address]  [phone]    Follow up  for outpatient cystoscopy    Elouise Macadam, MD  [address]  [address]  [place] [address]  [phone]    Follow up        Discharge Instructions    None          Refer to the after visit summary for additional discharge instructions.    Advance Directive: Not Received    Discharge Disposition: SNF    HOSPITAL COURSE      Chief Complaint/Reason for Admission: AKI, urinary retention    Hospitalization Summary:     The patient has a history of bladder mass and diabetes. He was discharged a few months ago and has not taken his meds since. He was found to be hypoglycemic and altered. His sugars were initially in the 40s.Insulin  and sulfonylurea were held, initially started on d10. He  was weaned off. He had an AKI with creatinine of 2.4, resolved with IV fluids and foley catheter placement. Urology evaluated due to bladder mass who recommends outpatient cystoscopy. He is being discharged on empiric antibiotics but cultures were negative. He had numerous pauses on tele and had a few episodes of ventricular tachycardia. Cardiology evaluated who recommended  electrolyte correction. I will stop his insulin  and glipizde due to mostly normal sugars in the hospital while being off. I will decrease his metformin . I will stop his metoprolol  due to pauses. He is recommended to follow up with urology and cardiology.    He had evidence of gout on his toe so I will give a short steroid course.    The patient was also COVID positive but asymptomatic, so suspect this was a previous infection that is no longer active.    I reviewed the status of all diagnoses listed in the problem list below, and considered these in my medical decision making for this patient. Any issues not commented on specifically above are considered stable, and the patient will continue his prior home treatment plan for them.    Principal Problem:    AKI (acute kidney injury)  Active Problems:    Diabetes mellitus (CMS/HCC)    Tobacco abuse    Bladder mass    Acute urinary retention    BPH (benign prostatic hyperplasia)    Noncompliance    Paroxysmal atrial fibrillation (CMS/HCC)    Spinal cord stimulator status    Hydronephrosis    Hypoglycemia    UTI (urinary tract infection)    Bradycardia    Diabetic neuropathy (CMS/HCC)    Toe pain    COVID      Notable Microbiology:   Microbiology Results       ID Description Status Collection Date/Time    [id number] RSV, FLU A&B, COVID-19 PCR (Abnormal) Final result 08/28/23 0725      Result Value Flag Comment    Influenza A Negative      Influenza B Negative      RSV PCR Negative      COVID-19, PCR Detected Abnormal             [id number] Culture, Urine (Urine, Midstream, Clean Catch)  Final result 08/28/23 0928      Result Value Flag Comment    CULTURE, URINE No growth                      Notable Diagnostics:   CT ABDOMEN PELVIS WO CONTRAST  Result Date: 08/28/2023  EXAM:  CT Abdomen and Pelvis without Intravenous Contrast INDICATION:  Right lower quadrant tenderness to palpation.  Falls. Hypoglycemia. COMPARISON:  CTA abdomen/pelvis 06/09/2023.  CT abdomen/pelvis  04/22/2021. TECHNIQUE: Helical CT data was obtained from the lung bases to the symphysis pubis without an intravenous contrast agent.  435 DICOM images received. IV CONTRAST:  None. Automated mA/kV exposure control was utilized and patient examination was performed in strict accordance with principles of ALARA. RADIATION AMOUNT:  494.83 mGy-cm. FINDINGS: Scout:  No acute abnormality. Lower Thorax:  Coronary calcification seen.  No cardiomegaly.  No pericardial effusion.  Lung bases are clear. Abdomen and Pelvis: Liver:  No hepatomegaly.  Smooth surface contour.  Normal attenuation. Biliary System:           Gallbladder:  Gallbladder is unremarkable.            Bile Ducts:  No intrahepatic or extrahepatic biliary ductal dilatation. Spleen:  Subcentimeter  hyperdensity seen in the spleen.  No splenomegaly. Pancreas:  No masses or ductal dilatation. Adrenals:  No thickening or nodules. Urinary System:            Kidneys and Ureters:  Kidneys are normal in size and location.  3 cm exophytic cyst off the posterior upper pole of the right kidney.  Some perinephric stranding adjacent to the right kidney.  There is dilatation of the intrarenal collecting systems, renal pelves and ureters down into the pelvis.  No renal or ureteral calculi.           Bladder:  There is irregular bladder wall thickening at the bladder base in the region of the ureterovesical junction.  There is distention of the bladder all the way up to the mid abdomen.  There is enlargement of the prostate gland with impression upon the bladder base.  On the prior exam, there was a Foley catheter in a partially collapsed bladder.  The hydronephrosis and hydroureter are likely unchanged since the prior exam. GI System:           Stomach:  No abnormalities identified.           Small Bowel:  No abnormalities identified.           Large Bowel:  No abnormalities identified. Vasculature:  Abdominal aorta is normal in caliber.  Atherosclerotic calcifications of  the abdominal aorta.  No aneurysm.  Lymph Nodes:  No lymphadenopathy. Peritoneal Cavity and Surface:  No free fluid.  No pneumoperitoneum. Bones:  Postsurgical changes in the lower lumbar/upper sacral spine.  Dorsal column stimulator in place incompletely imaged.  No acute fracture or aggressive osseous lesion. Soft Tissues:  No abnormalities identified. Reproductive Organs:  No abnormalities identified. Drains and Tubes:  None.     IMPRESSION: 1.  There is irregular bladder wall thickening at the bladder base in the region of the ureterovesical junction. There is distention of the bladder all the way up to the mid abdomen. There is enlargement of the prostate gland with impression upon the bladder base. On the prior exam, there was a Foley catheter in a partially collapsed bladder. The hydronephrosis and hydroureter are likely unchanged since the prior exam.  Findings are concerning for a possible bladder cancer and I would recommend direct visualization with cystoscopy. 2.  There is dilatation of the intrarenal collecting systems, renal pelves and ureters down into the pelvis. No renal or ureteral calculi. 3.  Coronary calcification seen. 4.  3 cm exophytic cyst off the posterior upper pole of the right kidney. Some perinephric stranding adjacent to the right kidney. Thank you for this referral. For further consultation, please call [phone] or [phone] (phones answered 24/7/365).  Electronically Signed by Ava Maffucci, MD 08/28/2023 11:01 AM EDT [facility]    CT HEAD WO CONTRAST  Result Date: 08/28/2023  EXAM:  CT Brain without Intravenous Contrast, CT Cervical Spine without Intravenous Contrast INDICATION:  Multiple falls. TECHNIQUE:  CT Brain without intravenous contrast:  Axial 5.0 mm images from the vertex to the tentorium and axial 2.5 mm images from the tentorium to the skull base without intravenous contrast material.  CT Cervical spine without intravenous contrast:  Contiguous  1.3 mm axial images were obtained from skull base to the thoracic inlet without intravenous contrast, followed by 2D reconstructions in the sagittal and coronal planes.  713 DICOM images received. IV Contrast:  None. Automated mA/kV exposure control was utilized and patient examination was performed in  strict accordance with principles of ALARA. RADIATION AMOUNT:  1249.27 mGy-cm. COMPARISON:  None available. FINDINGS: CT brain: There is no evidence of an acute intracranial hemorrhage, midline shift, or mass effect.  No intra- or extra-axial fluid collections.  There is preservation of the gray-white junction throughout. The ventricles are within normal limits for age in size and position.  Mild chronic microvascular ischemic changes.  No brain parenchymal lesions are identified. The osseous skull base and calvarium are intact.  The paranasal sinuses, mastoid air cells, and middle ears are clear.  The orbits and globes are unremarkable.  There are no significant extracranial soft tissue lesions. CT cervical spine: No acute fracture, subluxation, or malalignment.  The vertebral heights are well preserved.  Severe disc space narrowing C5/C6.  Anterior endplate degenerative spurring C4, C5, C6.  No prevertebral soft tissue swelling.  No epidural hematoma or canal compromise.  The visualized lung apices are clear.  Heterogeneous thyroid  gland.     IMPRESSION: CT brain:  No acute intracranial abnormality. CT cervical spine:  No fracture. Heterogeneous thyroid  gland, may contain nodule and can be better evaluated with nonemergent dedicated ultrasound. Thank you for this referral. For further consultation, please call [phone] or [phone] (phones answered 24/7/365).  Electronically Signed by Lonni Primes, MD 08/28/2023 9:04 AM EDT [facility]    CT C SPINE WO CONTRAST  Result Date: 08/28/2023  EXAM:  CT Brain without Intravenous Contrast, CT Cervical Spine without Intravenous Contrast  INDICATION:  Multiple falls. TECHNIQUE:  CT Brain without intravenous contrast:  Axial 5.0 mm images from the vertex to the tentorium and axial 2.5 mm images from the tentorium to the skull base without intravenous contrast material.  CT Cervical spine without intravenous contrast:  Contiguous 1.3 mm axial images were obtained from skull base to the thoracic inlet without intravenous contrast, followed by 2D reconstructions in the sagittal and coronal planes.  713 DICOM images received. IV Contrast:  None. Automated mA/kV exposure control was utilized and patient examination was performed in strict accordance with principles of ALARA. RADIATION AMOUNT:  1249.27 mGy-cm. COMPARISON:  None available. FINDINGS: CT brain: There is no evidence of an acute intracranial hemorrhage, midline shift, or mass effect.  No intra- or extra-axial fluid collections.  There is preservation of the gray-white junction throughout. The ventricles are within normal limits for age in size and position.  Mild chronic microvascular ischemic changes.  No brain parenchymal lesions are identified. The osseous skull base and calvarium are intact.  The paranasal sinuses, mastoid air cells, and middle ears are clear.  The orbits and globes are unremarkable.  There are no significant extracranial soft tissue lesions. CT cervical spine: No acute fracture, subluxation, or malalignment.  The vertebral heights are well preserved.  Severe disc space narrowing C5/C6.  Anterior endplate degenerative spurring C4, C5, C6.  No prevertebral soft tissue swelling.  No epidural hematoma or canal compromise.  The visualized lung apices are clear.  Heterogeneous thyroid  gland.     IMPRESSION: CT brain:  No acute intracranial abnormality. CT cervical spine:  No fracture. Heterogeneous thyroid  gland, may contain nodule and can be better evaluated with nonemergent dedicated ultrasound. Thank you for this referral. For further consultation, please call [phone] or  [phone] (phones answered 24/7/365).  Electronically Signed by Lonni Primes, MD 08/28/2023 9:04 AM EDT [facility]    ED Critical Care  Result Date: 08/28/2023  Lorrene Churchman, MD     08/28/2023  3:27 PM  ED Critical Care Performed by: Lorrene Churchman, MD Authorized by: Lorrene Churchman, MD  Critical care provider statement:   Critical care time (minutes):  35   Critical care time was exclusive of:  Separately billable procedures and treating other patients   Critical care was necessary to treat or prevent imminent or life-threatening deterioration of the following conditions:  Endocrine crisis   Critical care was time spent personally by me on the following activities:  Ordering and review of radiographic studies, ordering and review of laboratory studies, ordering and performing treatments and interventions and pulse oximetry      Recent Labs:   Recent Labs     08/29/23  0627 08/30/23  0629 08/31/23  0634   WBC 9.0 7.8 8.3   HGB 11.7* 11.0* 10.3*   PLT 194 167 186     Recent Labs     08/29/23  0627 08/30/23  0629 08/30/23  1752 08/31/23  0634   NA 133* 138 138 137   K 4.7 4.4 4.3 4.3   CL 117* 120* 117* 117*   CO2 12* 12* 13* 15*   BUN 53* 39* 42* 38*   CREATININE 2.0* 1.5* 1.5* 1.4*   GLU 158* 121* 148* 125*   MG 1.8 1.6 2.3 1.9     No results for input(s): PROT, ALBUMIN, TBILI, BILIDIR, AST, ALKPHOS, ALT in the last 72 hours.  Recent Labs     08/31/23  0634   TSH 0.60       DISCHARGE EXAMINATION     Wt Readings from Last 4 Encounters:   08/31/23 55.2 kg (121 lb 11.1 oz)   06/10/23 59.8 kg (131 lb 13.4 oz)   02/11/22 66 kg (145 lb 8.1 oz)   01/16/22 63.6 kg (140 lb 3.4 oz)       Vitals:    08/31/23 0755   BP: (!) 164/77   Pulse: 68   Resp: 13   Temp: 36.5 C (97.7 F)     General: no acute distress, no diaphoresis  CV: normal rate, normal S1, S2  Respiratory:  Normal effort, clear to auscultation bilaterally  GI: soft, non-tender, non-distended  MSK: no clubbing/cyanosis,  full ROM all extremities  Neurological: Mental status at baseline, no focal deficits  Skin: warm, dry    No emergency contact information on file.       Total Discharge Time Spent: 45 min.  I have discussed instructions for continuing care and medication changes.  All further questions regarding the patient's condition and follow-up needs were answered. The patient was in agreement with the discharge plan.    Electronically signed by:  Asad Rizvi, DO  [facility]  08/31/2023 / 13:08

## 2023-09-02 NOTE — Telephone Encounter (Signed)
-----   Message from Laurinda Grieve sent at 08/30/2023  2:20 PM EDT -----  Needs appt. for cysto and voiding trial in about 2 weeks, [place] if possible.

## 2023-10-18 ENCOUNTER — Encounter (INDEPENDENT_AMBULATORY_CARE_PROVIDER_SITE_OTHER): Payer: Self-pay

## 2023-10-18 ENCOUNTER — Encounter (INDEPENDENT_AMBULATORY_CARE_PROVIDER_SITE_OTHER): Admitting: Internal Medicine

## 2023-10-20 ENCOUNTER — Encounter (INDEPENDENT_AMBULATORY_CARE_PROVIDER_SITE_OTHER): Payer: Self-pay | Admitting: Internal Medicine

## 2023-10-20 NOTE — Progress Notes (Signed)
 This encounter was created in error - please disregard.    Items noted as "reviewed" are for administrative purposes only and are not guaranteed by the provider to be accurate on this date.

## 2023-10-23 ENCOUNTER — Encounter (INDEPENDENT_AMBULATORY_CARE_PROVIDER_SITE_OTHER): Payer: Self-pay | Admitting: Internal Medicine

## 2023-10-23 ENCOUNTER — Encounter (INDEPENDENT_AMBULATORY_CARE_PROVIDER_SITE_OTHER): Payer: Self-pay

## 2023-11-02 ENCOUNTER — Emergency Department
Admission: EM | Admit: 2023-11-02 | Discharge: 2023-11-02 | Disposition: A | Discharge status: Home / Self Care | Attending: Emergency Medicine | Admitting: Emergency Medicine

## 2023-11-02 ENCOUNTER — Emergency Department

## 2023-11-02 DIAGNOSIS — R000 Tachycardia, unspecified: Secondary | ICD-10-CM

## 2023-11-02 DIAGNOSIS — N390 Urinary tract infection, site not specified: Secondary | ICD-10-CM | POA: Insufficient documentation

## 2023-11-02 DIAGNOSIS — S91101A Unspecified open wound of right great toe without damage to nail, initial encounter: Secondary | ICD-10-CM | POA: Insufficient documentation

## 2023-11-02 DIAGNOSIS — X58XXXA Exposure to other specified factors, initial encounter: Secondary | ICD-10-CM | POA: Insufficient documentation

## 2023-11-02 DIAGNOSIS — T83511A Infection and inflammatory reaction due to indwelling urethral catheter, initial encounter: Secondary | ICD-10-CM | POA: Insufficient documentation

## 2023-11-02 DIAGNOSIS — M869 Osteomyelitis, unspecified: Secondary | ICD-10-CM | POA: Insufficient documentation

## 2023-11-02 DIAGNOSIS — E1169 Type 2 diabetes mellitus with other specified complication: Secondary | ICD-10-CM | POA: Insufficient documentation

## 2023-11-02 DIAGNOSIS — F17210 Nicotine dependence, cigarettes, uncomplicated: Secondary | ICD-10-CM | POA: Insufficient documentation

## 2023-11-02 LAB — LAB USE ONLY - CBC WITH DIFFERENTIAL
Absolute Basophils: 0.08 x10 3/uL (ref 0.00–0.08)
Absolute Eosinophils: 0.27 x10 3/uL (ref 0.00–0.44)
Absolute Immature Granulocytes: 0.02 x10 3/uL (ref 0.00–0.07)
Absolute Lymphocytes: 2.24 x10 3/uL (ref 0.42–3.22)
Absolute Monocytes: 0.73 x10 3/uL (ref 0.21–0.85)
Absolute Neutrophils: 4.79 x10 3/uL (ref 1.10–6.33)
Absolute nRBC: 0 x10 3/uL (ref <=0.00)
Basophils %: 1 %
Eosinophils %: 3.3 %
Hematocrit: 34.5 % — ABNORMAL LOW (ref 37.6–49.6)
Hemoglobin: 11.3 g/dL — ABNORMAL LOW (ref 12.5–17.1)
Immature Granulocytes %: 0.2 %
Lymphocytes %: 27.6 %
MCH: 27.6 pg (ref 25.1–33.5)
MCHC: 32.8 g/dL (ref 31.5–35.8)
MCV: 84.4 fL (ref 78.0–96.0)
MPV: 9.5 fL (ref 8.9–12.5)
Monocytes %: 9 %
Neutrophils %: 58.9 %
Platelet Count: 217 x10 3/uL (ref 142–346)
Preliminary Absolute Neutrophil Count: 4.79 x10 3/uL (ref 1.10–6.33)
RBC: 4.09 x10 6/uL — ABNORMAL LOW (ref 4.20–5.90)
RDW: 15 % (ref 11–15)
WBC: 8.13 x10 3/uL (ref 3.10–9.50)
nRBC %: 0 /100{WBCs} (ref <=0.0)

## 2023-11-02 LAB — ECG 12-LEAD
Atrial Rate: 141 {beats}/min
Q-T Interval: 332 ms
QRS Duration: 136 ms
QTC Calculation (Bezet): 505 ms
R Axis: 70 degrees
T Axis: 22 degrees
Ventricular Rate: 139 {beats}/min

## 2023-11-02 LAB — BASIC METABOLIC PANEL
Anion Gap: 10 (ref 5.0–15.0)
BUN: 29 mg/dL — ABNORMAL HIGH (ref 9–28)
CO2: 18 meq/L (ref 17–29)
Calcium: 9.3 mg/dL (ref 7.9–10.2)
Chloride: 108 meq/L (ref 99–111)
Creatinine: 1.3 mg/dL (ref 0.5–1.5)
GFR: 57.3 mL/min/1.73 m2 — ABNORMAL LOW (ref >=60.0)
Glucose: 190 mg/dL — ABNORMAL HIGH (ref 70–100)
Potassium: 4.1 meq/L (ref 3.5–5.3)
Sodium: 136 meq/L (ref 135–145)

## 2023-11-02 LAB — URINALYSIS WITH REFLEX TO MICROSCOPIC EXAM - REFLEX TO CULTURE
Urine Bilirubin: NEGATIVE
Urine Glucose: NEGATIVE
Urine Ketones: NEGATIVE mg/dL
Urine Nitrite: POSITIVE — AB
Urine Specific Gravity: 1.02 (ref 1.001–1.035)
Urine Urobilinogen: NORMAL mg/dL (ref 0.2–2.0)
Urine pH: 6 (ref 5.0–8.0)

## 2023-11-02 LAB — SEDIMENTATION RATE: Sed Rate: 41 mm/h — ABNORMAL HIGH (ref <=15)

## 2023-11-02 LAB — C-REACTIVE PROTEIN: C-Reactive Protein: 1.7 mg/dL — ABNORMAL HIGH (ref 0.0–1.1)

## 2023-11-02 LAB — LACTIC ACID: Whole Blood Lactic Acid: 1.3 mmol/L (ref 0.4–2.0)

## 2023-11-02 LAB — LAB USE ONLY - URINE GRAY CULTURE HOLD TUBE

## 2023-11-02 MED ORDER — SULFAMETHOXAZOLE-TRIMETHOPRIM 800-160 MG PO TABS
1.0000 | ORAL_TABLET | Freq: Two times a day (BID) | ORAL | 0 refills | Status: DC
Start: 2023-11-02 — End: 2023-11-02

## 2023-11-02 MED ORDER — STERILE WATER FOR INJECTION IJ/IV SOLN (WRAP)
1.0000 g | Freq: Once | Status: AC
Start: 2023-11-02 — End: 2023-11-02
  Administered 2023-11-02: 1 g via INTRAVENOUS
  Filled 2023-11-02: qty 1000

## 2023-11-02 MED ORDER — SODIUM CHLORIDE 0.9 % IV BOLUS
500.0000 mL | Freq: Once | INTRAVENOUS | Status: AC
Start: 2023-11-02 — End: 2023-11-02
  Administered 2023-11-02: 500 mL via INTRAVENOUS
  Filled 2023-11-02: qty 500

## 2023-11-02 MED ORDER — SULFAMETHOXAZOLE-TRIMETHOPRIM 800-160 MG PO TABS
1.0000 | ORAL_TABLET | Freq: Two times a day (BID) | ORAL | 0 refills | Status: DC
Start: 2023-11-02 — End: 2023-11-14

## 2023-11-02 MED ORDER — CEPHALEXIN 500 MG PO CAPS
500.0000 mg | ORAL_CAPSULE | Freq: Four times a day (QID) | ORAL | 0 refills | Status: DC
Start: 2023-11-02 — End: 2023-11-02

## 2023-11-02 MED ORDER — SULFAMETHOXAZOLE-TRIMETHOPRIM 800-160 MG PO TABS
1.0000 | ORAL_TABLET | Freq: Once | ORAL | Status: AC
Start: 2023-11-02 — End: 2023-11-02
  Administered 2023-11-02: 1 via ORAL
  Filled 2023-11-02: qty 1

## 2023-11-02 MED ORDER — GABAPENTIN 300 MG PO CAPS
600.0000 mg | ORAL_CAPSULE | Freq: Three times a day (TID) | ORAL | 0 refills | Status: DC
Start: 2023-11-02 — End: 2023-12-09

## 2023-11-02 MED ORDER — METOPROLOL SUCCINATE ER 25 MG PO TB24
25.0000 mg | ORAL_TABLET | Freq: Once | ORAL | Status: AC
Start: 2023-11-02 — End: 2023-11-02
  Administered 2023-11-02: 25 mg via ORAL
  Filled 2023-11-02: qty 1

## 2023-11-02 MED ORDER — METOPROLOL TARTRATE 25 MG PO TABS: 25.0000 mg | ORAL_TABLET | Freq: Two times a day (BID) | ORAL | 0 refills | Status: DC

## 2023-11-02 MED ORDER — GABAPENTIN 300 MG PO CAPS
600.0000 mg | ORAL_CAPSULE | Freq: Once | ORAL | Status: AC
Start: 2023-11-02 — End: 2023-11-02
  Administered 2023-11-02: 600 mg via ORAL
  Filled 2023-11-02: qty 2

## 2023-11-02 MED ORDER — ATORVASTATIN CALCIUM 80 MG PO TABS
80.0000 mg | ORAL_TABLET | Freq: Every day | ORAL | 0 refills | Status: DC
Start: 2023-11-02 — End: 2023-11-14

## 2023-11-02 MED ORDER — CEPHALEXIN 500 MG PO CAPS
500.0000 mg | ORAL_CAPSULE | Freq: Four times a day (QID) | ORAL | 0 refills | Status: DC
Start: 2023-11-02 — End: 2023-11-14

## 2023-11-02 NOTE — Consults (Signed)
 Foot & Ankle Surgery Consultation Note  Spectra  [phone] / Pager [phone]      Date Time: 11/02/23 2:13 PM  Patient Name: Scott Monroe, Scott Monroe.  Attending Physician: Tanda Tinnie DASEN, MD  Consulting Physician: Rea Agent, DPM      Reason for Consultation:   R hallux abscess and distal phalanx OM     Assessment:   Active Problems:    * No active hospital problems. *  Resolved Problems:    * No resolved hospital problems. *    Scott Erinn Huskins. is a 78 y.o. male with PMH T2DM, HTN, HLD, A-fib (on Eliquis ).  Seen in the ED on 11/02/2023 for concern for right hallux infection.  Fluctuance and discoloration noted on physical exam consistent with underlying abscess and evidence of distal phalanx osteomyelitis on x-ray.    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  Okay from podiatry standpoint for discharge on broad-spectrum oral antibiotics and follow-up outpatient for management of distal phalanx osteomyelitis.  - Purulent drainage expressed from underlying abscess located distal lateral aspect of nailbed.  Approximately 3 cc of purulent drainage expressed, drainage was cultured.  - Ordered noninvasive studies to be performed prior to discharge for further management in outpatient setting.  - Recommend broad spectrum empiric abx for polymicrobial infection  - Weight Bearing As Tolerated to the  right lower extremity  - Wound Care: Betadine, 2x2 gauze, and Kling applied to right hallux following expression of purulent drainage from  abscess.  - Plan discussed with attending, Dr. Rea Agent, DPM. Thank you for the consult Tanda Tinnie DASEN, MD.      Ozell Setting, DPM  Resident - Foot and Ankle Surgery   [facility] - PGY-1  [phone] [facility] On-Call Pager)   [phone] (Personal Pager)          History of Present Illness:   Scott Delahoussaye. is a 78 y.o. male who presents to the hospital with UTI symptoms and R hallux pain. Patient reports pain to R hallux began while in rehab center in [place] in late August to early September. Pt reports a podiatrist (does not recall who) would come into rehab center and cut his nails. He reports noticing redness, swelling and pain to the hallux shortly after leaving rehab center in early September. He reports noticing the discoloration to the distal, lateral tip of nail about a week ago. Patient also presents with burning sensation related to urinary catheter. Denies fever, chills, nausea, vomiting, SOB.    Review of Systems:   A comprehensive review of systems was: Negative except as noted in the HPI.    Physical Exam:   BP 153/72   Pulse 99   Temp 98 F (36.7 C) (Oral)   Resp 16   Ht 1.727 m (5' 8)   Wt 56.2 kg (124 lb)   SpO2 98%   BMI 18.85 kg/m  Gen: NAD, alert and oriented x3    Lower Extremity Exam:  Derm:  - Right: Skin normal texture and temperature with no open wounds. Lateral border of hallucal nail offending border with evidence of acute paronychia. No open wounds but discoloration noted to distal, lateral aspect of nail border suspicious for underlying abscess. Fluctuance noted to the area. Approximately 3cc of purulent drainage expressed from abscess site.  - Left: Normal texture, turgor, and temperature No open lesions, no interdigital maceration, no clinical signs of infection      Vasc:  - Right: DP doppler: monophasic, PT doppler: monophasic. Cap refill < 3 sec to digits.  - Left: DP doppler: monophasic, PT doppler: monophasic. Cap refill < 3 sec to  digits.    Neuro:  - Right: Gross sensation intact. Gross motor function intact.  - Left: Gross sensation intact. Gross motor function intact.    MSK:  - Right: No gross deformities, compartments soft, nontender, compressible  - Left: No gross deformities, compartments soft, nontender, compressible    Past Medical History:   Medical History[1]    Past Surgical History:   Past Surgical History[2]    Family History:   Family History[3]    Social History:   Social History[4]    Allergies:   Allergies[5]    Medications:   Prescriptions Prior to Admission[6]    Labs:     Results for orders placed or performed during the hospital encounter of 11/02/23 (from the past 24 hours)   Basic Metabolic Panel    Collection Time: 11/02/23 10:50 AM   Result Value    Glucose 190 (H)    BUN 29 (H)    Creatinine 1.3    Calcium  9.3    Sodium 136    Potassium 4.1    Chloride 108    CO2 18    Anion Gap 10.0    GFR 57.3 (L)   CBC with Differential (Component)    Collection Time: 11/02/23 10:50 AM   Result Value    WBC 8.13    Hemoglobin 11.3 (L)    Hematocrit 34.5 (L)    Platelet Count 217    MPV 9.5    RBC 4.09 (L)    MCV 84.4    MCH 27.6    MCHC 32.8    RDW 15    nRBC % 0.0    Absolute nRBC 0.00    Preliminary Absolute Neutrophil Count 4.79    Neutrophils % 58.9    Lymphocytes % 27.6    Monocytes % 9.0    Eosinophils % 3.3    Basophils % 1.0    Immature Granulocytes % 0.2    Absolute Neutrophils 4.79    Absolute Lymphocytes 2.24    Absolute Monocytes 0.73    Absolute Eosinophils 0.27    Absolute Basophils 0.08    Absolute Immature Granulocytes 0.02   Sedimentation Rate (ESR)    Collection Time: 11/02/23 10:50 AM   Result Value    Sed Rate 41 (H)   C Reactive Protein    Collection Time: 11/02/23 10:50 AM   Result Value    C-Reactive Protein 1.7 (H)   Urinalysis with Reflex to Microscopic Exam and Culture    Collection Time: 11/02/23 12:14 PM    Specimen: Urine, Clean Catch   Result Value    Urine Color Yellow    Urine Clarity Turbid (A)     Urine Specific Gravity 1.020    Urine pH 6.0  Urine Leukocyte Esterase Large (A)    Urine Nitrite Positive (A)    Urine Protein 100= 2+ (A)    Urine Glucose Negative    Urine Ketones Negative    Urine Urobilinogen Normal    Urine Bilirubin Negative    Urine Blood Large (A)    RBC, UA Too numerous to count (A)    Urine WBC Too numerous to count (A)    Urine Squamous Epithelial Cells 0-5   Lactic Acid    Collection Time: 11/02/23  1:15 PM   Result Value    Whole Blood Lactic Acid 1.3       Radiology:   Toes Right 2+ Vws  Result Date: 11/02/2023  1.Prominent erosive/destructive change with fragmentation in the distal tuft and shaft of the distal phalanx, right great toe, concerning for osteomyelitis and pathologic fracture. 2.Nonhealing wound in the dorsal aspect of the right great toe overlying the distal phalanx and at the region of the nailbed. [name], MD 11/02/2023 12:14 PM               [1]   Past Medical History:  Diagnosis Date    Hyperlipidemia     Hypertension     Insomnia     Irregular heart beat     Restless leg     Sleep apnea    [2]   Past Surgical History:  Procedure Laterality Date    BACK SURGERY      SPINAL CORD STIMULATOR IMPLANT     [3] No family history on file.  [4]   Social History  Socioeconomic History    Marital status: Married   Tobacco Use    Smoking status: Every Day     Current packs/day: 1.00     Types: Cigarettes    Smokeless tobacco: Never   Vaping Use    Vaping status: Never Used   Substance and Sexual Activity    Alcohol use: Never    Drug use: Never     Social Drivers of Psychologist, prison and probation services Strain: Low Risk  (01/15/2022)    Received [facility]    Overall Financial Resource Strain (CARDIA)     Difficulty of Paying Living Expenses: Not hard at all   Food Insecurity: No Food Insecurity (11/02/2023)    Hunger Vital Sign     Worried About Running Out of Food in the Last Year: Never true     Ran Out of Food in the Last Year: Never true   Recent  Concern: Food Insecurity - Food Insecurity Present (08/29/2023)    Received from [facility]    Hunger Vital Sign     Within the past 12 months, you worried that your food would run out before you got the money to buy more.: Sometimes true     Within the past 12 months, the food you bought just didn't last and you didn't have money to get more.: Sometimes true   Transportation Needs: No Transportation Needs (11/02/2023)    PRAPARE - Therapist, art (Medical): No     Lack of Transportation (Non-Medical): No   Recent Concern: Transportation Needs - Unmet Transportation Needs (08/29/2023)    [facility]    [facility] - Transportation     In the past 12 months, has lack of transportation kept you from medical appointments or from getting medications?: Yes  In the past 12 months, has lack of transportation kept you from meetings, work, or from getting things needed for daily living?: Yes   Physical Activity: Inactive (04/22/2021)    Received from [facility]    Exercise Vital Sign     On average, how many days per week do you engage in moderate to strenuous exercise (like a brisk walk)?: 0 days     On average, how many minutes do you engage in exercise at this level?: 0 min   Stress: No Stress Concern Present (01/15/2022)    Received from [facility]    [facility] - Occupational Stress Questionnaire     Feeling of Stress : Only a little   Social Connections: Socially Isolated (01/15/2022)    Received from [place] [facility]    Social Connection and Isolation Panel     In a typical week, how many times do you talk on the phone with family, friends, or neighbors?: More than three times a week     How often do you get together with friends or relatives?: Never     How often do you attend church or religious services?: Never     Do you belong  to any clubs or organizations such as church groups, unions, fraternal or athletic groups, or school groups?: No     How often do you attend meetings of the clubs or organizations you belong to?: Never     Are you married, widowed, divorced, separated, never married, or living with a partner?: Separated   Intimate Partner Violence: Not At Risk (11/02/2023)    Humiliation, Afraid, Rape, and Kick questionnaire     Fear of Current or Ex-Partner: No     Emotionally Abused: No     Physically Abused: No     Sexually Abused: No   Housing Stability: Not At Risk (11/02/2023)    Housing Stability NCSS     Do you have housing?: Yes     Are you worried about losing your housing?: No   [5]   Allergies  Allergen Reactions    Strawberry C [Ascorbate] Hives   [6] (Not in a hospital admission)

## 2023-11-02 NOTE — Discharge Instructions (Signed)
 Dear Mr. Abebe:    Thank you for choosing the [facility], the premier emergency department in the [place] area.  I hope your visit today was EXCELLENT. You will receive a survey via text message that will give you the opportunity to provide feedback to your team about your visit. Please do not hesitate to reach out with any questions!    Specific instructions for your visit today:       Patient Instructions  Urinary Tract Infection  Your care team told you that you have a urinary tract infection called a UTI. Your urinary tract starts in your kidneys. They filter your blood and get rid of waste in the form of pee.     The urinary tract includes kidneys, ureters, bladder, and urethra.   Pee, called urine, moves through tubes from your kidneys to your bladder, where it is stored. Then, it passes through a tube called a urethra and out of your body.  UTIs come from bacteria that gets in your pee. From your urethra, it can move up to your bladder and kidneys.  This is painful and makes you feel sick. You might  Feel burning and pain when you pee  Feel like you need to pee all the time  Have problems holding your pee  Have pee that is bloody, cloudy, or smells bad  Have a fever  Feel sick to your stomach or throw up  These infections can get serious if they get into your kidneys. So, it is vital that you start treatment right away. Call 9-1-1 or get to the emergency room if you have confusion, trouble breathing, or you get dizzy or pass out.   How to Care for a UTI   Antibiotics are used to treat UTIs. Your care team has you give a urine sample first. This means you pee in a special cup with a lid that screws tight.     Follow their directions for how to pee in the cup.  Feel better in about 1 or 2 days, but do not stop taking the medicine. If you do, the infection could come back and be harder to treat.  The test lets them know what type of bacteria caused your infection. Then they can give  you the best medicine. Make sure to take all of your medicine until it is gone. You should  Drink at least 8 cups of fluids like water or tea each day. Rest in a dark, quiet room when you can. Take a break from phones and other screens. This makes you feel better and helps your body fight the infection.  You can use a heating pad for lower back or stomach pain. Use it every hour for 10 to 30 minutes. Do not put a heating pad on your bare skin.   Preventing UTIs   It is common to get many UTIs in your life. Some people feel frustrated or scared when they happen often. But there are things you can do to help stop them.  Drink fluids like water or tea every day  Go to the bathroom when you need to. Do not hold your urine  Wipe front to back when you use the bathroom  Pee after vaginal and anal sex  Use unscented, water-based lube when you have sex  Do not use spermicide, a type of birth control  If you have been through menopause, talk to your care team about vaginal estrogen. It comes in a  pill or cream and helps stop bladder infections.  Some people use cranberry juice, probiotics, or vitamin C to help with UTIs. There is no clear evidence that these remedies work, so talk with your care team before using them.  UTIs can happen to anyone. They can hurt and be hard to deal with. You are not alone.   When to Contact Your Care Team   If you get the infection treated early and follow your care team's directions, you should feel better in about 2 days. But get a hold of your care team if  You have a fever over 100.55F or 38C  Your symptoms get worse  You are vomiting  If you get UTIs often, contact a specialist. They can help find the cause of your infections and give you tools to prevent them.     To learn more about Urinary Tract Infections, go to  https://myto.us /p-[email or web address]   Where Can You Learn More?  Watch the video by using your phone camera to click on the QR Code above. IF YOU HAVE A MEDICAL EMERGENCY, CALL 911 OR  GO TO THE EMERGENCY ROOM.      Bone Infection     Osteomyelitis is a bone infection. It can happen if germs from another part of your body travel through your blood and get into your bones. It can also happen if your bone is exposed to germs due to an injury or surgery.   Most often, bacteria cause bone infections. In rare cases, yeast or other germs can cause them too.   Most bone infections require surgery. This is to remove infection and any dead bone or tissue. It can also fix damaged bones and joints.   Signs and Symptoms   Pain, redness, warmth, and swelling around the bone  Fever  A wound that drains or does not heal  Weakness and tiredness  Medicine   Bone infections are treated with antibiotics. These drugs kill bacteria. You will need to be on them for several weeks or even months to get rid of the infection.   Take your antibiotics exactly as your care team tells you. Do not skip doses. Do not stop taking the pills before your care team tells you to, even if you feel better.   If you do not finish all the pills, your infection can come back worse and be harder to treat. This risk is serious with a bone infection. You could lose a limb, need surgery, or even die from the infection.   In the hospital, you may have gotten antibiotics through a tube into your vein, called an IV. If you go home with an IV, you may need to go to a clinic to get your medicine. A nurse may come to your home to give it to you.   It may be uncomfortable to go home with an IV, but your care team is there to help. They will show you how to care for your IV site. They will check it whenever they give you your medicine.     If you have pain, ask your care team about over-the-counter medicines you can take. Ibuprofen, like Advil and Motrin, and acetaminophen , like Tylenol , can help. Before taking any of these, tell your care team if you take blood thinners, have a history of bleeding or stomach ulcers, or have a history of kidney or  liver disease.   Activity   Talk to your care team about what activities are safe. Ask  them how much you should do.   Do physical therapy and rehab if your care team tells you to. Wear any splint, sling, or brace that your care team gives you.     These things are important for healing. If you do not do them, you may not get as strong or be able to move as well as you need to.   Talk to your care team if you are worried about the cost or the time it takes to do these activities. Insurance may cover them. If you do not have insurance, your care team can help you find a way to afford them.   Rebuild your strength slowly. Start with small steps like gentle stretching. Do more as you are able. Rest when you are tired.   Be very careful not to injure the bone that is infected. Avoid trauma. This means trying not to bump it against anything or let anything hit it. Ask your care team how much weight you can lift. If you have sudden pain or get injured, let your care team know right away.   Smoking   Do not smoke, vape, or use other tobacco products. These make it harder for your body to heal when you have an infection. This could put your limb, and even your life, at risk. Ask your care team for support if you need help quitting.   Wound Care   Wash your hands before and after you touch your wound or your IV site for medicine. Use soap and water for at least 20 seconds. This is the time it takes to sing the song "Happy Dortha" 2 times.     Your care team will show you how to keep your wound and IV site clean and dry. Follow their instructions. It is an important part of fighting infection.   If you do not do these things, the infection could get worse or come back. You could also get a new infection.   When to Contact Your Care Team   Call if you have signs of infection, like:   Chills or a fever over 100.12F or 38C  Red, swollen, or hot skin over the bone  Liquid, pus, or a bad smell coming from the wound or IV  site  Pain that does not get better  You and your care team will work together to keep you healthy. Keep all your appointments. Do all the lab tests that your care team tells you to do. You and your care team need to know how your bone infection is healing so that you can get better and stay well.   You are not alone. Talk to your care team if you have questions or problems. They are there to help you heal.   Thank You for Choosing Us    Thank you for trusting us  with your care. We are here to support you and want you to feel your best. Contact us  with any questions.   IF YOU HAVE A MEDICAL EMERGENCY, CALL 911 OR GO TO THE EMERGENCY ROOM.   The information presented is intended for general information and educational purposes. It is not intended to replace the advice of your health care provider. Contact your health care provider if you believe you have a health problem.   Last updated January 2025    2025 [employer] All rights reserved.    IF YOU DO NOT CONTINUE TO IMPROVE OR YOUR CONDITION WORSENS, PLEASE CONTACT YOUR DOCTOR OR RETURN  IMMEDIATELY TO THE EMERGENCY DEPARTMENT.    Sincerely,  Reche Dadds, PA-C &  Tanda Tinnie DASEN, MD  Attending Emergency Physician  [facility]    ONSITE PHARMACY  Our full service onsite pharmacy is located in the ER waiting room.  Open 7 days a week from 9 am to 9 pm.  We accept all major insurances and prices are competitive with major retailers.  Ask your provider to print your prescriptions down to the pharmacy to speed you on your way home.    OBTAINING A PRIMARY CARE APPOINTMENT    Primary care physicians (PCPs, also known as primary care doctors) are either internists or family medicine doctors. Both types of PCPs focus on health promotion, disease prevention, patient education and counseling, and treatment of acute and chronic medical conditions.    If you need a primary care doctor, please call the below number and ask who is receiving  new patients.     [facility]  Telephone:  [phone]  [email or web address]    DOCTOR REFERRALS  Call [phone] (available 24 hours a day, 7 days a week) if you need any further referrals and we can help you find a primary care doctor or specialist.  Also, available online at:  [email or web address]    YOUR CONTACT INFORMATION  Before leaving please check with registration to make sure we have an up-to-date contact number.  You can call registration at [phone] to update your information.  For questions about your hospital bill, please call [phone].  For questions about your Emergency Dept Physician bill please call [phone].      FREE HEALTH SERVICES  If you need help with health or social services, please call 2-1-1 for a free referral to resources in your area.  2-1-1 is a free service connecting people with information on health insurance, free clinics, pregnancy, mental health, dental care, food assistance, housing, and substance abuse counseling.  Also, available online at:  http://www.[facility].org    MEDICAL RECORDS AND TESTS  Certain laboratory test results do not come back the same day, for example urine cultures.   We will contact you if other important findings are noted.  Radiology films are often reviewed again to ensure accuracy.  If there is any discrepancy, we will notify you.      Please call [phone] to pick up a complimentary CD of any radiology studies performed.  If you or your doctor would like to request a copy of your medical records, please call [phone].      ORTHOPEDIC INJURY   Please know that significant injuries can exist even when an initial x-ray is read as normal or negative.  This can occur because some fractures (broken bones) are not initially visible on x-rays.  For this reason, close outpatient follow-up with your primary care doctor or bone specialist (orthopedist) is required.    MEDICATIONS AND FOLLOWUP  Please be  aware that some prescription medications can cause drowsiness.  Use caution when driving or operating machinery.    The examination and treatment you have received in our Emergency Department is provided on an emergency basis, and is not intended to be a substitute for your primary care physician.  It is important that your doctor checks you again and that you report any new or remaining problems at that time.      24 HOUR PHARMACIES  The nearest 24 hour pharmacy is:    [facility]  [address]  [address] [place], [place] [address]  [phone]      ASSISTANCE WITH INSURANCE    Affordable Care Act  [facility])  Call to start or finish an application, compare plans, enroll or ask a question.  [phone]  TTY: [phone]  Web:  Healthcare.gov    Help Enrolling in [facility]  [employer] [place]   [phone] (TOLL-FREE)  [phone] (TTY)  Web:  Http://www.coverva.org    Local Help Enrolling in the [employer]  [place]  Family Service  [phone] (MAIN)  Email:  health-help@nvfs .org  Web:  [employer]jackMyths.is  Address:  [address] [place], [place] [address]    SEDATING MEDICATIONS  Sedating medications include strong pain medications (e.g. narcotics), muscle relaxers, benzodiazepines (used for anxiety and as muscle relaxers), Benadryl/diphenhydramine and other antihistamines for allergic reactions/itching, and other medications.  If you are unsure if you have received a sedating medication, please ask your physician or nurse.  If you received a sedating medication: DO NOT drive a car. DO NOT operate machinery. DO NOT perform jobs where you need to be alert.  DO NOT drink alcoholic beverages while taking this medicine.     If you get dizzy, sit or lie down at the first signs. Be careful going up and down stairs.  Be extra careful to prevent falls.     Never give this medicine to others.     Keep this medicine out of reach of children.     Do not take or save old medicines. Throw them  away when outdated.     Keep all medicines in a cool, dry place. DO NOT keep them in your bathroom medicine cabinet or in a cabinet above the stove.    MEDICATION REFILLS  Please be aware that we cannot refill any prescriptions through the ER. If you need further treatment from what is provided at your ER visit, please follow up with your primary care doctor or your pain management specialist.    FREESTANDING EMERGENCY DEPARTMENTS OF [facility]  Did you know [facility] has two freestanding ERs located just a few miles away?  [facility] [place] and [facility] [place] have short wait times, easy free parking directly in front of the building and top patient satisfaction scores - and the same Board Certified Emergency Medicine doctors as [facility].

## 2023-11-02 NOTE — ED Provider Notes (Signed)
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)  Open wound of right great toe, initial encounter    Discharge Medication List as of 11/02/2023  6:06 PM        START taking these medications    Details   cephALEXin  (KEFLEX ) 500 MG capsule Take 1 capsule (500 mg) by mouth 4 (four) times daily for 7 days, Starting Sat 11/02/2023, Until Sat 11/09/2023, E-Rx      sulfamethoxazole -trimethoprim  (BACTRIM  DS) 800-160 MG per tablet Take 1 tablet by mouth 2 (two) times daily for 7 days, Starting Sat 11/02/2023, Until Sat 11/09/2023, E-Rx               History of Present Illness      Nursing Triage Note:    Chief Complaint: Urinary Tract Infection Symptoms    History obtained from: patient and family member (daughter)     History of Present Illness  Scott Monroe. is a 78 year old male w/ a PMHx of hyperlipidemia, hypertension, A-fib (currently on Eliquis ), bladder mass, diabetes who presents with dysuria and concern for a wound to his right great toe.    (1) Dysuria-patient currently has a urinary catheter in place.  Patient states the urinary catheter has been there since he was discharged from [facility].  Patient states it has not been changed in 2 months.  Patient complains of dysuria over the past 4 to 5 days.  Patient states his urine has been a coffee like color but today it was lighter.  Patient denies blood in urine, abdominal pain, back pain, fever, nausea, vomiting.  Patient states he has had small episodes of diarrhea over the last few days.  Patient has not followed up with a urologist since being discharged from [facility].    (2) right great toe-patient states he has had a wound to his right great toe since late August/early September.  Patient states  roughly 7 to 10 days ago he started to notice a discoloration on the toe.  Patient states while he was inpatient at [facility] the podiatrist did remove the toenail.  Patient states the pain is worse when he bears weight or walks.    Of note patient states he did not take any of his morning medications today.    Past medical history: HLD, HTN    External Documents Reviewed:  - Discharge summary, [facility], 08/31/2023: Patient was admitted from 08/28/2023 to 08/31/2023 for AKI.  History of bladder mass and diabetes.  Discharged a few months ago but has not been taking meds since.  Found to be hypoglycemic and altered.  Initial sugars in the 40s, started on D10, gradually weaned off.  AKI with creatinine of 2.4 which resolved with IV hydration and Foley catheter placement.  Urology evaluated due to bladder mass who recommends outpatient cystoscopy.  Being discharged on empiric antibiotics but cultures were negative.  He had numerous pauses on telemetry and a few episodes of ventricular tachycardia.  Cardiology evaluated and recommended electrolyte correction.  I will stop his insulin  and glipizide  due to mostly normal sugars in the hospital while being off.  Will decrease metformin .  Will stop his metoprolol  due to pauses.  Has evidence of gout on his  toe so I will give a short steroid course.  Also COVID-positive but asymptomatic.    Physical Exam     Pulse (!) 110  BP (!) 141/99  Resp 18  SpO2 99 %  Temp 98 F (36.7 C)  Physical Exam  CONSTITUTIONAL: Alert, normal appearance. Well-developed and well-groomed. Nontoxic-appearing. Not diaphoretic.  HENT: Normocephalic, atraumatic.  EYES: PERRL, EOMI. No conjunctival injection present.  CARDIOVASCULAR: Heart normal rate, regular rhythm.  Right DPP 2+.  Right PT 2+.  Right femoral pulse 2+.  PULMONARY: Pulmonary effort is normal. Normal breath sounds in all lung fields.  ABDOMINAL: Abdomen flat and soft. No abdominal tenderness to palpation. No right  CVA tenderness. No left CVA tenderness.  MUSCULOSKELETAL: Moving all extremities equally and with ease.  Right great toe-thickened toenails with questionable fungal infection, partial toenail present, mild swelling and erythema present right great toe with yellow discoloration, questionable paronychia versus abscess.  SKIN: Warm, dry.  NEURO: Alert and oriented to person, place, and time.  PSYCHIATRIC: Behavior is cooperative.         Medical Decision Making        ED Course:    11 AM-evaluated patient in ED.  Will order labs, toe x-ray, metoprolol , IV fluids.    ED Course as of 11/04/23 2304   Sat Nov 02, 2023   1226 Toes Right 2+ Vws  Paged podiatry [CM]   1239 Spoke to podiatry who will see in ED [CM]   1302 Pt continues to be tachycardic. Will order 2nd dose of Metoprolol . Podiatry at bedside.  [CM]   1422 Podiatry discussed plan. Recommend Ultrasounds in ED for outpatient followup . Abscess expressed by podiatry. Recommend d/c home with abx  [CM]      ED Course User Index  [CM] Boyce Reche SAUNDERS, [place]     Reviewed all results with pt. Will discharge pt home with Keflex , Bactrim .  Patient is requesting refill of his metoprolol , atorvastatin , gabapentin . Instructed pt to follow up with podiatry and urology in. Instructed pt to return to the ED for worsening symptoms, including but not limited to, creased pain, fever, vomiting. Pt expresses understanding.     Final Impression:  78 year old male w/ a PMHx of hyperlipidemia, hypertension, A-fib (currently on Eliquis ), bladder mass, diabetes who presents with dysuria and concern for a wound to his right great toe.  (1) Dysuria-patient currently has a urinary catheter in place.  Patient states the urinary catheter has been there since he was discharged from [facility].  Patient states it has not been changed in 2 months.  Patient complains of dysuria over the past 4 to 5 days.  Patient states his urine has been a coffee like color but today it was lighter.   Patient denies blood in urine, abdominal pain, back pain, fever, nausea, vomiting.  Patient states he has had small episodes of diarrhea over the last few days.  Patient has not followed up with a urologist since being discharged from [facility].  Catheter exchanged in ED.  Will order labs to assess for signs of infection, AKI, UTI.  Labs show no leukocytosis, no AKI.  Mild anemia noted.  Glucose elevated at 190.  UA positive for UTI.  Ceftriaxone  given in ED.  Plan to discharge home with Keflex  and Bactrim  which will cover osteomyelitis/abscess as well as UTI.  (2) right great toe-patient states he has had a wound to his right great toe since late August/early September.  Patient  states roughly 7 to 10 days ago he started to notice a discoloration on the toe.  Patient states while he was inpatient at [facility] the podiatrist did remove the toenail.  Patient states the pain is worse when he bears weight or walks.  Right great toe is swollen with yellow discoloration present.  Concern for abscess versus paronychia.  Will order x-rays to assess for osteomyelitis.  Will order labs.   Labs show no leukocytosis.  Sed rate/CRP are elevated at 41/1.7.  X-ray shows prominent erosive/destructive changes concerning for osteomyelitis and pathologic fracture, nonhealing wound in dorsal aspect of right great toe overlying distal phalanx and at region of nailbed.  Podiatry was consulted who evaluated patient in the ED.  Performed bedside I&D and recommends outpatient follow-up with podiatry.  Podiatry also ordered noninvasive lower extremity arterial Doppler and arterial ultrasounds for outpatient follow-up.  After patient was discharged home received call from radiology that patient had a occlusion of the popliteal artery but had distal reconstitution.  Patient is already anticoagulated.  Podiatry was made aware of this finding and agrees that it does not change plan and patient should still follow-up as an  outpatient.  Given UTI as well as osteomyelitis and abscess blood cultures and lactic acid were ordered.  Lactic acid within normal limits at 1.3.  Blood cultures are pending at this time.  Patient presented tachycardic and an irregularly irregular rhythm.  Patient does have a history of A-fib.  Patient states he did not take any of his morning medications today.  P.o. metoprolol  given in ED with improvement of heart rate.  Patient is currently anticoagulated.  The patient was deemed stable for discharge. They were given strict return precautions as it relates to their presumed diagnosis, verbalized understanding of these precautions and agreed to follow up as instructed. All questions were answered prior to discharge.    Medical Decision Making  Amount and/or Complexity of Data Reviewed  Labs: ordered.  Radiology: ordered. Decision-making details documented in ED Course.  ECG/medicine tests: ordered.    Risk  Prescription drug management.                  Interpretations   O2 Sat:  The patient's oxygen saturation was 99 % on room air. This was independently interpreted by me as Normal.   EKG: I reviewed and Independently interpreted the patient's EKG as atrial fibrillation at 139.  RVR, RBBB                       Procedures      Procedures      Supplemental Encounter Data   Medical History[7]  Past Surgical History[8]  Social History[9]  Family History[10]  Allergies[11]    Medications Administered:  Medications   metoprolol  succinate XL (TOPROL -XL) 24 hr tablet 25 mg (25 mg Oral Given 11/02/23 1105)   sodium chloride  0.9 % bolus 500 mL (0 mLs Intravenous Stopped 11/02/23 1213)   cefTRIAXone  (ROCEPHIN ) 1 g in sterile water  (preservative free) 10 mL IV push injection (1 g Intravenous Given 11/02/23 1331)   metoprolol  succinate XL (TOPROL -XL) 24 hr tablet 25 mg (25 mg Oral Given 11/02/23 1331)   gabapentin  (NEURONTIN ) capsule 600 mg (600 mg Oral Given 11/02/23 1504)   sulfamethoxazole -trimethoprim  (BACTRIM  DS)  800-160 MG per tablet 1 tablet (1 tablet Oral Given 11/02/23 1504)     Laboratory and Imaging Studies:     US  Noninvasive Low Extrem Art Dopp/Press/Wavefrms (Abi-Ppg) Ltd  1-2 Lvls   Final Result      1.Bilateral ABI and TBI consistent with severe peripheral arterial disease.   Severe multifocal atherosclerotic disease throughout the bilateral lower   extremity arteries.   2.Right lower extremity: Focal occlusion in the mid/distal popliteal artery   with distal reconstitution. Three-vessel runoff with weakly biphasic   waveforms.   3.Left lower extremity: Pulses parvus and tardus waveforms in the proximal   common femoral artery in setting of known common iliac artery stenosis.   Occlusion of the superficial femoral artery with reconstitution of the   popliteal artery. Three-vessel runoff with weakly monophasic waveforms.      Findings above discussed with ED PA Reche Dadds via telephone at 6:43 PM   on 11/02/2023 with verbal confirmation.      [name], MD   11/02/2023 6:43 PM      US  Arterial/ Graft Duplex Dopp Low Extrem Bil Comp   Final Result      1.Bilateral ABI and TBI consistent with severe peripheral arterial disease.   Severe multifocal atherosclerotic disease throughout the bilateral lower   extremity arteries.   2.Right lower extremity: Focal occlusion in the mid/distal popliteal artery   with distal reconstitution. Three-vessel runoff with weakly biphasic   waveforms.   3.Left lower extremity: Pulses parvus and tardus waveforms in the proximal   common femoral artery in setting of known common iliac artery stenosis.   Occlusion of the superficial femoral artery with reconstitution of the   popliteal artery. Three-vessel runoff with weakly monophasic waveforms.      Findings above discussed with ED PA Reche Dadds via telephone at 6:43 PM   on 11/02/2023 with verbal confirmation.      [name], MD   11/02/2023 6:43 PM      Toes Right 2+ Vws   Final Result      1.Prominent  erosive/destructive change with fragmentation in the distal   tuft and shaft of the distal phalanx, right great toe, concerning for   osteomyelitis and pathologic fracture.   2.Nonhealing wound in the dorsal aspect of the right great toe overlying   the distal phalanx and at the region of the nailbed.      [name], MD   11/02/2023 12:14 PM                  [7]   Past Medical History:  Diagnosis Date    Hyperlipidemia     Hypertension     Insomnia     Irregular heart beat     Restless leg     Sleep apnea    [8]   Past Surgical History:  Procedure Laterality Date    BACK SURGERY      SPINAL CORD STIMULATOR IMPLANT     [9]   Social History  Tobacco Use    Smoking status: Every Day     Current packs/day: 1.00     Types: Cigarettes    Smokeless tobacco: Never   Vaping Use    Vaping status: Never Used   Substance Use Topics    Alcohol use: Never    Drug use: Never   [10] No family history on file.  [11]   Allergies  Allergen Reactions    Strawberry C [Ascorbate] [address]        Dadds Reche SAUNDERS, [place]  11/04/23 2304       Tanda Tinnie DASEN, MD  11/05/23  0618

## 2023-11-03 LAB — LAB USE ONLY - ACID FAST BACILLI STAIN: Acid Fast Bacilli Stain: NONE SEEN

## 2023-11-03 LAB — LAB USE ONLY - FUNGAL STAIN: Fungal Stain: NONE SEEN

## 2023-11-04 ENCOUNTER — Inpatient Hospital Stay
Admission: EM | Admit: 2023-11-04 | Discharge: 2023-11-14 | DRG: 617 | Disposition: A | Discharge status: Home Health | Attending: Student in an Organized Health Care Education/Training Program | Admitting: Student in an Organized Health Care Education/Training Program

## 2023-11-04 DIAGNOSIS — M86171 Other acute osteomyelitis, right ankle and foot: Secondary | ICD-10-CM | POA: Diagnosis present

## 2023-11-04 DIAGNOSIS — F17210 Nicotine dependence, cigarettes, uncomplicated: Secondary | ICD-10-CM | POA: Diagnosis present

## 2023-11-04 DIAGNOSIS — Z79899 Other long term (current) drug therapy: Secondary | ICD-10-CM

## 2023-11-04 DIAGNOSIS — E1152 Type 2 diabetes mellitus with diabetic peripheral angiopathy with gangrene: Secondary | ICD-10-CM | POA: Diagnosis present

## 2023-11-04 DIAGNOSIS — D638 Anemia in other chronic diseases classified elsewhere: Secondary | ICD-10-CM | POA: Diagnosis present

## 2023-11-04 DIAGNOSIS — I482 Chronic atrial fibrillation, unspecified: Secondary | ICD-10-CM | POA: Diagnosis present

## 2023-11-04 DIAGNOSIS — N401 Enlarged prostate with lower urinary tract symptoms: Secondary | ICD-10-CM | POA: Diagnosis present

## 2023-11-04 DIAGNOSIS — I7092 Chronic total occlusion of artery of the extremities: Secondary | ICD-10-CM | POA: Diagnosis present

## 2023-11-04 DIAGNOSIS — E785 Hyperlipidemia, unspecified: Secondary | ICD-10-CM | POA: Diagnosis present

## 2023-11-04 DIAGNOSIS — I4510 Unspecified right bundle-branch block: Secondary | ICD-10-CM | POA: Diagnosis present

## 2023-11-04 DIAGNOSIS — N390 Urinary tract infection, site not specified: Secondary | ICD-10-CM | POA: Diagnosis present

## 2023-11-04 DIAGNOSIS — B957 Other staphylococcus as the cause of diseases classified elsewhere: Secondary | ICD-10-CM | POA: Diagnosis present

## 2023-11-04 DIAGNOSIS — Z7982 Long term (current) use of aspirin: Secondary | ICD-10-CM

## 2023-11-04 DIAGNOSIS — B965 Pseudomonas (aeruginosa) (mallei) (pseudomallei) as the cause of diseases classified elsewhere: Secondary | ICD-10-CM | POA: Diagnosis present

## 2023-11-04 DIAGNOSIS — Z9682 Presence of neurostimulator: Secondary | ICD-10-CM

## 2023-11-04 DIAGNOSIS — I5032 Chronic diastolic (congestive) heart failure: Secondary | ICD-10-CM | POA: Diagnosis present

## 2023-11-04 DIAGNOSIS — I70261 Atherosclerosis of native arteries of extremities with gangrene, right leg: Secondary | ICD-10-CM | POA: Diagnosis present

## 2023-11-04 DIAGNOSIS — N170 Acute kidney failure with tubular necrosis: Secondary | ICD-10-CM | POA: Diagnosis present

## 2023-11-04 DIAGNOSIS — Z7902 Long term (current) use of antithrombotics/antiplatelets: Secondary | ICD-10-CM

## 2023-11-04 DIAGNOSIS — E1122 Type 2 diabetes mellitus with diabetic chronic kidney disease: Secondary | ICD-10-CM | POA: Diagnosis present

## 2023-11-04 DIAGNOSIS — B9620 Unspecified Escherichia coli [E. coli] as the cause of diseases classified elsewhere: Secondary | ICD-10-CM | POA: Diagnosis present

## 2023-11-04 DIAGNOSIS — M86671 Other chronic osteomyelitis, right ankle and foot: Secondary | ICD-10-CM | POA: Diagnosis present

## 2023-11-04 DIAGNOSIS — Z596 Low income: Secondary | ICD-10-CM

## 2023-11-04 DIAGNOSIS — D6869 Other thrombophilia: Secondary | ICD-10-CM | POA: Diagnosis present

## 2023-11-04 DIAGNOSIS — I739 Peripheral vascular disease, unspecified: Secondary | ICD-10-CM | POA: Insufficient documentation

## 2023-11-04 DIAGNOSIS — B961 Klebsiella pneumoniae [K. pneumoniae] as the cause of diseases classified elsewhere: Secondary | ICD-10-CM | POA: Diagnosis present

## 2023-11-04 DIAGNOSIS — E1169 Type 2 diabetes mellitus with other specified complication: Principal | ICD-10-CM | POA: Diagnosis present

## 2023-11-04 DIAGNOSIS — T83518A Infection and inflammatory reaction due to other urinary catheter, initial encounter: Secondary | ICD-10-CM | POA: Diagnosis present

## 2023-11-04 DIAGNOSIS — N1831 Chronic kidney disease, stage 3a: Secondary | ICD-10-CM | POA: Diagnosis present

## 2023-11-04 DIAGNOSIS — E1140 Type 2 diabetes mellitus with diabetic neuropathy, unspecified: Secondary | ICD-10-CM | POA: Diagnosis present

## 2023-11-04 DIAGNOSIS — D62 Acute posthemorrhagic anemia: Secondary | ICD-10-CM | POA: Diagnosis not present

## 2023-11-04 DIAGNOSIS — R7881 Bacteremia: Principal | ICD-10-CM | POA: Diagnosis present

## 2023-11-04 DIAGNOSIS — L03115 Cellulitis of right lower limb: Secondary | ICD-10-CM | POA: Diagnosis present

## 2023-11-04 DIAGNOSIS — I130 Hypertensive heart and chronic kidney disease with heart failure and stage 1 through stage 4 chronic kidney disease, or unspecified chronic kidney disease: Secondary | ICD-10-CM | POA: Diagnosis present

## 2023-11-04 DIAGNOSIS — R338 Other retention of urine: Secondary | ICD-10-CM | POA: Diagnosis present

## 2023-11-04 DIAGNOSIS — E1165 Type 2 diabetes mellitus with hyperglycemia: Secondary | ICD-10-CM | POA: Diagnosis present

## 2023-11-04 DIAGNOSIS — Z7901 Long term (current) use of anticoagulants: Secondary | ICD-10-CM

## 2023-11-04 DIAGNOSIS — I2510 Atherosclerotic heart disease of native coronary artery without angina pectoris: Secondary | ICD-10-CM | POA: Diagnosis present

## 2023-11-04 DIAGNOSIS — N329 Bladder disorder, unspecified: Secondary | ICD-10-CM | POA: Diagnosis present

## 2023-11-04 DIAGNOSIS — G4700 Insomnia, unspecified: Secondary | ICD-10-CM | POA: Diagnosis present

## 2023-11-04 DIAGNOSIS — Z7984 Long term (current) use of oral hypoglycemic drugs: Secondary | ICD-10-CM

## 2023-11-04 DIAGNOSIS — Z91018 Allergy to other foods: Secondary | ICD-10-CM

## 2023-11-04 DIAGNOSIS — Y846 Urinary catheterization as the cause of abnormal reaction of the patient, or of later complication, without mention of misadventure at the time of the procedure: Secondary | ICD-10-CM | POA: Diagnosis present

## 2023-11-04 LAB — CBC
Absolute nRBC: 0 x10 3/uL (ref <=0.00)
Hematocrit: 33.3 % — ABNORMAL LOW (ref 37.6–49.6)
Hemoglobin: 11 g/dL — ABNORMAL LOW (ref 12.5–17.1)
MCH: 27.8 pg (ref 25.1–33.5)
MCHC: 33 g/dL (ref 31.5–35.8)
MCV: 84.1 fL (ref 78.0–96.0)
MPV: 9.6 fL (ref 8.9–12.5)
Platelet Count: 216 x10 3/uL (ref 142–346)
RBC: 3.96 x10 6/uL — ABNORMAL LOW (ref 4.20–5.90)
RDW: 15 % (ref 11–15)
WBC: 9.18 x10 3/uL (ref 3.10–9.50)
nRBC %: 0 /100{WBCs} (ref <=0.0)

## 2023-11-04 LAB — BASIC METABOLIC PANEL
Anion Gap: 9 (ref 5.0–15.0)
BUN: 32 mg/dL — ABNORMAL HIGH (ref 9–28)
CO2: 19 meq/L (ref 17–29)
Calcium: 9.1 mg/dL (ref 7.9–10.2)
Chloride: 113 meq/L — ABNORMAL HIGH (ref 99–111)
Creatinine: 1.8 mg/dL — ABNORMAL HIGH (ref 0.5–1.5)
GFR: 38.8 mL/min/1.73 m2 — ABNORMAL LOW (ref >=60.0)
Glucose: 167 mg/dL — ABNORMAL HIGH (ref 70–100)
Potassium: 4.8 meq/L (ref 3.5–5.3)
Sodium: 141 meq/L (ref 135–145)

## 2023-11-04 LAB — COMPREHENSIVE METABOLIC PANEL
ALT: 17 U/L (ref <=55)
AST (SGOT): 25 U/L (ref <=41)
Albumin/Globulin Ratio: 1.2 (ref 0.9–2.2)
Albumin: 3.9 g/dL (ref 3.8–5.0)
Alkaline Phosphatase: 110 U/L (ref 37–117)
Anion Gap: 9 (ref 5.0–15.0)
BUN: 27 mg/dL (ref 9–28)
Bilirubin, Total: 0.2 mg/dL (ref 0.2–1.2)
CO2: 21 meq/L (ref 17–29)
Calcium: 9.6 mg/dL (ref 7.9–10.2)
Chloride: 110 meq/L (ref 99–111)
Creatinine: 1.5 mg/dL (ref 0.5–1.5)
GFR: 48.2 mL/min/1.73 m2 — ABNORMAL LOW (ref >=60.0)
Globulin: 3.3 g/dL (ref 2.0–3.6)
Glucose: 160 mg/dL — ABNORMAL HIGH (ref 70–100)
Potassium: 4.9 meq/L (ref 3.5–5.3)
Protein, Total: 7.2 g/dL (ref 6.0–8.3)
Sodium: 140 meq/L (ref 135–145)

## 2023-11-04 LAB — HEMOGLOBIN A1C
Average Estimated Glucose: 162.8 mg/dL
Hemoglobin A1C: 7.3 % — ABNORMAL HIGH (ref 4.6–5.6)

## 2023-11-04 LAB — LACTIC ACID: Whole Blood Lactic Acid: 1.7 mmol/L (ref 0.4–2.0)

## 2023-11-04 LAB — LAB USE ONLY - CBC WITH DIFFERENTIAL
Absolute Basophils: 0.09 x10 3/uL — ABNORMAL HIGH (ref 0.00–0.08)
Absolute Eosinophils: 0.31 x10 3/uL (ref 0.00–0.44)
Absolute Immature Granulocytes: 0.06 x10 3/uL (ref 0.00–0.07)
Absolute Lymphocytes: 1.94 x10 3/uL (ref 0.42–3.22)
Absolute Monocytes: 0.67 x10 3/uL (ref 0.21–0.85)
Absolute Neutrophils: 5.23 x10 3/uL (ref 1.10–6.33)
Absolute nRBC: 0 x10 3/uL (ref <=0.00)
Basophils %: 1.1 %
Eosinophils %: 3.7 %
Hematocrit: 38.1 % (ref 37.6–49.6)
Hemoglobin: 12.3 g/dL — ABNORMAL LOW (ref 12.5–17.1)
Immature Granulocytes %: 0.7 %
Lymphocytes %: 23.4 %
MCH: 27.5 pg (ref 25.1–33.5)
MCHC: 32.3 g/dL (ref 31.5–35.8)
MCV: 85 fL (ref 78.0–96.0)
MPV: 9.6 fL (ref 8.9–12.5)
Monocytes %: 8.1 %
Neutrophils %: 63 %
Platelet Count: 231 x10 3/uL (ref 142–346)
Preliminary Absolute Neutrophil Count: 5.23 x10 3/uL (ref 1.10–6.33)
RBC: 4.48 x10 6/uL (ref 4.20–5.90)
RDW: 15 % (ref 11–15)
WBC: 8.3 x10 3/uL (ref 3.10–9.50)
nRBC %: 0 /100{WBCs} (ref <=0.0)

## 2023-11-04 LAB — WHOLE BLOOD GLUCOSE POCT: Whole Blood Glucose POCT: 167 mg/dL — ABNORMAL HIGH (ref 70–100)

## 2023-11-04 LAB — NARES, MRSA (METHICILLIN-RESISTANT STAPHYLOCOCCUS AUREUS) SCREENING, PCR: MRSA (methicillin resistant Staphylococcus aureus) DNA: NOT DETECTED

## 2023-11-04 MED ORDER — GLUCOSE 40 % PO GEL (WRAP)
15.0000 g | ORAL | Status: DC | PRN
Start: 2023-11-04 — End: 2023-11-14

## 2023-11-04 MED ORDER — BENZONATATE 100 MG PO CAPS
100.0000 mg | ORAL_CAPSULE | Freq: Three times a day (TID) | ORAL | Status: DC | PRN
Start: 2023-11-04 — End: 2023-11-14

## 2023-11-04 MED ORDER — BENZOCAINE-MENTHOL MT LOZG (WRAP)
1.0000 | LOZENGE | OROMUCOSAL | Status: DC | PRN
Start: 2023-11-04 — End: 2023-11-14

## 2023-11-04 MED ORDER — APIXABAN 5 MG PO TABS
5.0000 mg | ORAL_TABLET | Freq: Two times a day (BID) | ORAL | Status: DC
Start: 2023-11-04 — End: 2023-11-05
  Administered 2023-11-04: 5 mg via ORAL
  Filled 2023-11-04: qty 1

## 2023-11-04 MED ORDER — MAGNESIUM SULFATE IN D5W 1-5 GM/100ML-% IV SOLN
1.0000 g | INTRAVENOUS | Status: DC | PRN
Start: 2023-11-04 — End: 2023-11-14

## 2023-11-04 MED ORDER — DEXTROSE 50 % IV SOLN
12.5000 g | INTRAVENOUS | Status: DC | PRN
Start: 2023-11-04 — End: 2023-11-14

## 2023-11-04 MED ORDER — VANCOMYCIN PHARMACY TO DOSE PLACEHOLDER
INTRAVENOUS | Status: DC
Start: 2023-11-04 — End: 2023-11-05

## 2023-11-04 MED ORDER — DEXTROSE 10 % IV BOLUS
12.5000 g | INTRAVENOUS | Status: DC | PRN
Start: 2023-11-04 — End: 2023-11-14

## 2023-11-04 MED ORDER — ASPIRIN 81 MG PO TBEC
81.0000 mg | DELAYED_RELEASE_TABLET | Freq: Every day | ORAL | Status: DC
Start: 2023-11-04 — End: 2023-11-09
  Administered 2023-11-05 – 2023-11-08 (×4): 81 mg via ORAL
  Filled 2023-11-04 (×5): qty 1

## 2023-11-04 MED ORDER — ATORVASTATIN CALCIUM 80 MG PO TABS
80.0000 mg | ORAL_TABLET | Freq: Every day | ORAL | Status: AC
Start: 2023-11-04 — End: ?
  Administered 2023-11-04 – 2023-11-14 (×11): 80 mg via ORAL
  Filled 2023-11-04 (×4): qty 1
  Filled 2023-11-04: qty 2
  Filled 2023-11-04: qty 1
  Filled 2023-11-04: qty 2
  Filled 2023-11-04 (×3): qty 1
  Filled 2023-11-04: qty 2

## 2023-11-04 MED ORDER — GABAPENTIN 300 MG PO CAPS
600.0000 mg | ORAL_CAPSULE | Freq: Three times a day (TID) | ORAL | Status: DC
Start: 2023-11-04 — End: 2023-11-11
  Administered 2023-11-04 – 2023-11-11 (×20): 600 mg via ORAL
  Filled 2023-11-04 (×3): qty 2
  Filled 2023-11-04: qty 1
  Filled 2023-11-04 (×16): qty 2
  Filled 2023-11-04: qty 1

## 2023-11-04 MED ORDER — POTASSIUM CHLORIDE CRYS ER 20 MEQ PO TBCR
0.0000 meq | EXTENDED_RELEASE_TABLET | ORAL | Status: DC | PRN
Start: 2023-11-04 — End: 2023-11-14

## 2023-11-04 MED ORDER — TRAZODONE HCL 50 MG PO TABS
50.0000 mg | ORAL_TABLET | Freq: Every evening | ORAL | Status: AC
Start: 2023-11-04 — End: ?
  Administered 2023-11-04 – 2023-11-12 (×9): 50 mg via ORAL
  Filled 2023-11-04 (×9): qty 1

## 2023-11-04 MED ORDER — METOPROLOL TARTRATE 25 MG PO TABS
25.0000 mg | ORAL_TABLET | Freq: Two times a day (BID) | ORAL | Status: DC
Start: 2023-11-04 — End: 2023-11-14
  Administered 2023-11-04 – 2023-11-14 (×20): 25 mg via ORAL
  Filled 2023-11-04 (×20): qty 1

## 2023-11-04 MED ORDER — POTASSIUM CHLORIDE 20 MEQ PO PACK
0.0000 meq | PACK | ORAL | Status: DC | PRN
Start: 2023-11-04 — End: 2023-11-14

## 2023-11-04 MED ORDER — VANCOMYCIN HCL 1 G IV SOLR
20.0000 mg/kg | Freq: Once | INTRAVENOUS | Status: AC
Start: 2023-11-04 — End: 2023-11-04
  Administered 2023-11-04: 1000 mg via INTRAVENOUS
  Filled 2023-11-04: qty 1000

## 2023-11-04 MED ORDER — GLUCAGON 1 MG IJ SOLR (WRAP)
1.0000 mg | INTRAMUSCULAR | Status: DC | PRN
Start: 2023-11-04 — End: 2023-11-14

## 2023-11-04 MED ORDER — ACETAMINOPHEN 500 MG PO TABS
500.0000 mg | ORAL_TABLET | ORAL | Status: DC | PRN
Start: 2023-11-04 — End: 2023-11-07
  Administered 2023-11-04 – 2023-11-05 (×3): 500 mg via ORAL
  Filled 2023-11-04 (×3): qty 1

## 2023-11-04 MED ORDER — SALINE SPRAY 0.65 % NA SOLN
2.0000 | NASAL | Status: DC | PRN
Start: 2023-11-04 — End: 2023-11-14

## 2023-11-04 MED ORDER — INSULIN LISPRO 100 UNIT/ML SOLN (WRAP)
1.0000 [IU] | Freq: Three times a day (TID) | Status: AC
Start: 2023-11-05 — End: ?
  Administered 2023-11-05: 3 [IU] via SUBCUTANEOUS
  Administered 2023-11-05: 1 [IU] via SUBCUTANEOUS
  Administered 2023-11-06 – 2023-11-07 (×2): 2 [IU] via SUBCUTANEOUS
  Administered 2023-11-08: 1 [IU] via SUBCUTANEOUS
  Administered 2023-11-08 – 2023-11-12 (×3): 2 [IU] via SUBCUTANEOUS
  Filled 2023-11-04 (×3): qty 6
  Filled 2023-11-04: qty 3
  Filled 2023-11-04: qty 6
  Filled 2023-11-04: qty 9
  Filled 2023-11-04: qty 3
  Filled 2023-11-04: qty 6

## 2023-11-04 MED ORDER — TAMSULOSIN HCL 0.4 MG PO CAPS
0.4000 mg | ORAL_CAPSULE | Freq: Every day | ORAL | Status: DC
Start: 2023-11-04 — End: 2023-11-14
  Administered 2023-11-04 – 2023-11-13 (×10): 0.4 mg via ORAL
  Filled 2023-11-04 (×11): qty 1

## 2023-11-04 MED ORDER — NALOXONE HCL 0.4 MG/ML IJ SOLN (WRAP)
0.2000 mg | INTRAMUSCULAR | Status: DC | PRN
Start: 2023-11-04 — End: 2023-11-14

## 2023-11-04 MED ORDER — GLUCAGON 1 MG IJ SOLR (WRAP)
1.0000 mg | INTRAMUSCULAR | Status: DC | PRN
Start: 2023-11-04 — End: 2023-11-12

## 2023-11-04 MED ORDER — STERILE WATER FOR INJECTION IJ/IV SOLN (WRAP)
4.5000 g | Freq: Once | Status: AC
Start: 2023-11-04 — End: 2023-11-04
  Administered 2023-11-04: 4.5 g via INTRAVENOUS
  Filled 2023-11-04: qty 22.2

## 2023-11-04 MED ORDER — POTASSIUM CHLORIDE 10 MEQ/100ML IV SOLN (WRAP)
10.0000 meq | INTRAVENOUS | Status: DC | PRN
Start: 2023-11-04 — End: 2023-11-14

## 2023-11-04 MED ORDER — POTASSIUM & SODIUM PHOSPHATES 280-160-250 MG PO PACK
2.0000 | PACK | ORAL | Status: DC | PRN
Start: 2023-11-04 — End: 2023-11-14

## 2023-11-04 MED ORDER — MELATONIN 3 MG PO TABS
3.0000 mg | ORAL_TABLET | Freq: Every evening | ORAL | Status: DC | PRN
Start: 2023-11-04 — End: 2023-11-14
  Administered 2023-11-05 – 2023-11-12 (×2): 3 mg via ORAL
  Filled 2023-11-04 (×2): qty 1

## 2023-11-04 MED ORDER — VALSARTAN 40 MG PO TABS
40.0000 mg | ORAL_TABLET | Freq: Every day | ORAL | Status: DC
Start: 2023-11-04 — End: 2023-11-05
  Administered 2023-11-04 – 2023-11-05 (×2): 40 mg via ORAL
  Filled 2023-11-04 (×2): qty 1

## 2023-11-04 MED ORDER — SODIUM CHLORIDE 0.9 % IV MBP
4.5000 g | Freq: Three times a day (TID) | INTRAVENOUS | Status: DC
Start: 2023-11-04 — End: 2023-11-14
  Administered 2023-11-04 – 2023-11-14 (×29): 4.5 g via INTRAVENOUS
  Filled 2023-11-04 (×30): qty 22.2

## 2023-11-04 MED ORDER — CARBOXYMETHYLCELLULOSE SOD PF 0.5 % OP SOLN
1.0000 [drp] | Freq: Three times a day (TID) | OPHTHALMIC | Status: DC | PRN
Start: 2023-11-04 — End: 2023-11-14

## 2023-11-04 NOTE — Progress Notes (Signed)
 Pharmacy Vancomycin Dosing Consult    Patient is on vancomycin day 1 for Bacteremia     Current Vancomycin Dose: 1000 mg x1 @1328  10/20  Target Guidance:    Goal trough (if applicable): Random level 10-20 mcg/mL     Other anti-infectives: Zosyn    Patient Metrics & Relevant Labs:  Estimated Creatinine Clearance: 28.2 mL/min (A) (based on SCr of 1.8 mg/dL (H)).  Recent Labs     11/04/23  1756 11/04/23  1101 11/02/23  1050   WBC 9.18 8.30 8.13   BUN 32* 27 29*   Creatinine 1.8* 1.5 1.3     Wt Readings from Last 1 Encounters:   11/04/23 56.2 kg (124 lb)       Relevant Microbiology:  10/20 - Cultures pending    Recent Doses/Levels    Doses: 1000 mg IV x1    Levels:   No results for input(s): VANCO in the last 72 hours.    Recommendations:    1. Vancomycin to be dosed by levels based on renal fxn.   2. First dose given at 1328 on 10/20 in ED  3. Ordered Vanco level with AM blood draw for 10/21 (which is within 8-16 after 1st dose)  4. Reassess next dose based on results      Thank you for consulting pharmacy to dose Vancomycin for this patient.  We will continue to monitor, obtain levels, and adjust dosing as indicated for the duration of this consult.    Bralen Wiltgen, PharmD

## 2023-11-04 NOTE — ED Provider Notes (Addendum)
 At 2:55 AM:  Received call from lab  Blood culture is positive for GNR  I called the patient at tel: [phone]   No answer  Mailbox was full - was unable to leave a message    At 7AM:  Case forwarded on to DR Cammie who is the 7AM doc.  She will attempt to contact the patient again later this morning.        Braxton Isles, MD  11/04/23 0620        9:23 AM  Able to reach patient's daughter, Garfield Coiner, whose number is listed in patient's chart ([phone]). Daughter was advised that patient needs to return to the ER as soon as possible. She verbalized understanding and will bring him in now.      Cammie Chiquita BRAVO, MD  11/04/23 9074       Cammie Chiquita BRAVO, MD  11/04/23 [phone]

## 2023-11-04 NOTE — ED to IP RN Note (Signed)
 [facility] EMERGENCY DEPT  ED NURSING NOTE FOR THE RECEIVING INPATIENT NURSE   ED NURSE Ami DUTY [id number]   ED CHARGE RN Aimee   ADMISSION INFORMATION   Scott Monroe. is a 78 y.o. male admitted with an ED diagnosis of:    1. Bacteremia         Isolation: None   Allergies: Strawberry c [ascorbate]   Holding Orders confirmed? No   Belongings Documented? No   Home medications sent to pharmacy confirmed? No   NURSING CARE   Patient Comes From:   Mental Status: Home/Family Care  alert and oriented   ADL: Independent with all ADLs   Ambulation: ambulates with: cane and walker   Pertinent Information  and Safety Concerns:     Broset Violence Risk Level: Low N/A     CT / NIH   CT Head ordered on this patient?  No   NIH/Dysphagia assessment done prior to admission? No   VITAL SIGNS (at the time of this note)      Vitals:    11/04/23 1330   BP: 186/86   Pulse: 88   Resp: 20   Temp:    SpO2: 99%     Pain Score: 2-mild pain (11/04/23 1028)

## 2023-11-04 NOTE — ED Notes (Signed)
 MD at bedside.

## 2023-11-04 NOTE — Progress Notes (Signed)
 Case Management to follow for discharge planning needs.        Case Management Initial Assessment    11/04/23 1723   Patient Type   Within 30 Days of Previous Admission? No   Healthcare Decisions   Interviewed: Family  (Introduced self and explained the role of case management.)   Name of interviewee if other than the pt: Wallick,MARJORI (Daughter)   English as a second language teacher Information: [phone] (Mobile)   Orientation/Decision Making Abilities of Patient Alert and Oriented x3, able to make decisions   Prior to admission   Prior level of function Ambulates with assistive device;Needs assistance with ADLs;Independent with ADLs  (Uses a walker or quad cane, and not often but sometimes uses a WC. Some days patient may need help with ADLs.)   Type of Residence Private residence  (Demographics verified - [address] [place] [address]. Patient just moved with his daughter. Daughter is providing care/ support for both parents.)   Home Layout Multi-level;Bed/bath upstairs;Ramped entrance   Have running water, electricity, heat, etc? Yes   Living Arrangements Children;Other (Comment)  (Ex  spouse - marital status updated)   How do you get to your MD appointments? Daughter   How do you get your groceries? Daughter   Who fixes your meals? Daughter   Who does your laundry? Daughter   Who picks up your prescriptions? Daughter   Dressing Needs assistance   Grooming Independent   Feeding Independent   Bathing Needs assistance   Toileting Independent  (Patient has a catheter.)   DME Currently at [employer], [place], [place];ADL- Paediatric nurse;Other (Comment)  (Grab bars will be put in.)   Home Care/Community Services Other (Comment)  (Has a hx of home care services)   Prior Rehab admission? (Detail) Yes, in [facility]   Discharge Planning   Support Systems Children   Expected Discharge Disposition Other  (To be determined)   Potential barriers to discharge: Decreased mobility   Mode of transportation: Other  (To be  determined)   Does the patient have perscription coverage? Not Specified  (Insurance verified - MEDICARE [facility] MEDICARE [employer]. Daughter does not believe the patient has prescription coverage.)   Financial Resource Strain   How hard is it for you to pay for the very basics like food, housing, medical care, and heating? Hard  (Food)   Housing   Do you have housing? Y   Are you worried about losing your housing? N   Transportation Needs   In the past 12 months, has lack of transportation kept you from medical appointments or from getting medications? no   In the past 12 months, has lack of transportation kept you from meetings, work, or from getting things needed for daily living? No   Consults/Providers   PT Evaluation Needed   (To be determined)   OT Evalulation Needed   (To be determined)   SLP Evaluation Needed 2   Correct PCP listed in Epic?   (Daughter is in the process of getting patient a PCP appt.)   Family and PCP   PCP on file was verified as the current PCP? Patient/family states they do not have a PCP   In case you are admitted, transferred or discharged, would like family notified? Yes   Name of family member to be notified HALFORD, GOETZKE (Daughter)  [phone] (Mobile)     Glenys Ricker, RN, MSN, MHA  RN Case Manager 1 - Remote  Case Management Department   [facility]  [address], [place], [place] [address]  [phone] Remote Office  [email or web address] E-mail     Case Management location: remote

## 2023-11-04 NOTE — ED Provider Notes (Signed)
 [facility]  Emergency Department Physician Note      Diagnosis/Disposition     ED Disposition:  Admit    ED Diagnosis:     Bacteremia      History of Present Illness      Nursing Triage Note:    Chief Complaint: Blood Infection    History of Present Illness  Scott Godinho. is a 78 year old male who I called back in earlier today after overnight team was notified about positive blood culture.     Patient was seen here 10/18 for toe pain and had abscess drained by podiatry. Wound culture grew pseudomonas. Per podiatry note, imaging yesterday w/ stable chronic osteomyelitis. Recommended PO antibiotics and outpatient follow up. Was also dx with UTI and given oral antibiotic. Financial constraints have prevented him from obtaining / starting the prescribed antibiotics. Blood cultures now growing gram negative rod so patient was told to come back for IV antibiotics and further infectious workup, podiatry management.     Patient complaints of ongoing intense pain in his toe, radiating to his heel, with the swelling and redness. The redness has worsened over time. Denies fevers.       Physical Exam     Pulse (!) 113  BP (!) 161/95  Resp 18  SpO2 99 %  Temp 97.5 F (36.4 C)     Physical Exam  GENERAL: Alert, cooperative, well developed, no acute distress.  HEENT: Normocephalic, normal oropharynx, moist mucous membranes.  CHEST: Clear to auscultation bilaterally, no wheezes, rhonchi, or crackles.  CARDIOVASCULAR: Normal rate, irregular rhythm, S1 and S2 normal without murmurs.  ABDOMEN: Soft, non-tender, non-distended, without organomegaly, normal bowel sounds.  EXTREMITIES: R great toe with edema, erythema extending up dorsum of foot. Pain with manipulation of the digit. See photos below.   NEUROLOGICAL: Cranial nerves grossly intact, moves all extremities without gross motor or sensory deficit.               Medical Decision Making          Medical Decision Making  [phone] w/ T2DM, HTN, HLD, afib  (on eliquis ). Patient was seen here 10/18 for toe pain and had abscess drained by podiatry. Wound culture grew pseudomonas. Per podiatry note, imaging yesterday w/ stable chronic osteomyelitis. Recommended PO antibiotics and outpatient follow up. Financial constraints have prevented him from obtaining the prescribed antibiotics. Blood cultures now growing gram negative rod so patient was told to come back for IV antibiotics and further infectious workup, podiatry management.     Osteomyelitis of right great toe with foot cellulitis, positive blood cultures  - Afebrile, WBC 8.3. Lactate 1.7. Not meeting criteria for severe sepsis or septic shock  - Administer IV antibiotics to cover for Pseudomonas.  - Admit to the hospital for further management and monitoring.  - Consult podiatry for specialist evaluation and management.  - Obtain repeat blood cultures to confirm or rule out bacteremia.    Afib with RVR:  - Initial HR of 113, but has improved without intervention to <100  - No indication for further intervention at this time    The patient was admitted and handed off to hospitalist. We discussed all aspects of the work-up that had been completed in the ED, any pending studies/therapies, and all clinical decision making at the time care was handed off.          Medical Decision Making  Amount and/or Complexity of Data Reviewed  Labs: ordered.  Risk  Decision regarding hospitalization.          Was management discussed with a consultant? : I discussed management with podiatry. The patient's condition and all pertinent labs and/or radiology studies discussed. The podiatry resident will come evaluate patient and follow him during inpatient admission.                        Interpretations   O2 Sat:  The patient's oxygen saturation was 99 % on room air. This was independently interpreted by me as Normal.                        Procedures      Procedures      Supplemental Encounter Data   Medical History[1]  Past  Surgical History[2]  Social History[3]  Family History[4]  Allergies[5]    Medications Administered:  Medications   dextrose  (GLUCOSE) 40 % oral gel 15 g of glucose ( Oral MAR Hold 11/12/23 1016)     Or   dextrose  (D10W) 10% bolus 125 mL ( Intravenous MAR Hold 11/12/23 1016)     Or   dextrose  50 % bolus 12.5 g ( Intravenous MAR Hold 11/12/23 1016)     Or   glucagon  (rDNA) (GLUCAGEN) injection 1 mg ( Intramuscular MAR Hold 11/12/23 1016)   melatonin tablet 3 mg ( Oral MAR Hold 11/12/23 1016)   saline (OCEAN NASAL SPRAY) 0.65 % nasal solution 2 spray ( Each Nare MAR Hold 11/12/23 1016)   carboxymethylcellulose (PF) (REFRESH PLUS) 0.5 % ophthalmic solution 1 drop ( Both Eyes MAR Hold 11/12/23 1016)   benzocaine -menthol  (CEPACOL/CHLORASEPTIC) lozenge 1 lozenge ( Buccal MAR Hold 11/12/23 1016)   benzonatate  (TESSALON ) capsule 100 mg ( Oral MAR Hold 11/12/23 1016)   magnesium  sulfate 1g in dextrose  5% IVPB (premix) ( Intravenous MAR Hold 11/12/23 1016)   potassium chloride  (KLOR-CON  M20) CR tablet 0-60 mEq ( Oral MAR Hold 11/12/23 1016)     Or   potassium chloride  (KLOR-CON ) packet 0-60 mEq ( Oral MAR Hold 11/12/23 1016)     Or   potassium chloride  10 mEq in 100 mL IVPB (premix) ( Intravenous MAR Hold 11/12/23 1016)   potassium & sodium phosphates  (PHOS-NAK) 280-160-250 MG packet 2 packet ( Oral MAR Hold 11/12/23 1016)   naloxone  (NARCAN ) injection 0.2 mg ( Intravenous MAR Hold 11/12/23 1016)   piperacillin -tazobactam (ZOSYN ) 4.5 g in sodium chloride  0.9 % 100 mL IVPB mini-bag plus ( Intravenous Automatically Held 11/16/23 2100)   atorvastatin  (LIPITOR) tablet 80 mg ( Oral Automatically Held 11/16/23 0900)   metoprolol  tartrate (LOPRESSOR ) tablet 25 mg ( Oral Automatically Held 11/16/23 2100)   traZODone  (DESYREL ) tablet 50 mg ( Oral Automatically Held 11/16/23 2200)   tamsulosin  (FLOMAX ) capsule 0.4 mg ( Oral Automatically Held 11/16/23 1730)   dextrose  (GLUCOSE) 40 % oral gel 15 g of glucose ( Oral MAR Hold 11/12/23  1016)     Or   dextrose  (D10W) 10% bolus 125 mL ( Intravenous MAR Hold 11/12/23 1016)     Or   dextrose  50 % bolus 12.5 g ( Intravenous MAR Hold 11/12/23 1016)     Or   glucagon  (rDNA) (GLUCAGEN) injection 1 mg ( Intramuscular MAR Hold 11/12/23 1016)   insulin  lispro injection 1-5 Units ( Subcutaneous Automatically Held 11/17/23 1645)   0.9% NaCl infusion (0 mL/hr Intravenous Stopped 11/07/23 0933)   oxyCODONE  (ROXICODONE ) immediate release tablet 5 mg (  Oral MAR Hold 11/12/23 1016)   oxyCODONE  (ROXICODONE ) immediate release tablet 10 mg ( Oral MAR Hold 11/12/23 1016)   acetaminophen  (TYLENOL ) tablet 1,000 mg ( Oral Automatically Held 11/16/23 2200)   amLODIPine  (NORVASC ) tablet 10 mg ( Oral Automatically Held 11/16/23 0900)   0.9% NaCl infusion ( Intravenous Not Given 11/09/23 0010)   0.9% NaCl infusion (0 mL/hr Intravenous Stopped 11/09/23 1515)   clopidogrel  (PLAVIX ) tablet 75 mg ( Oral Automatically Held 11/16/23 0900)   heparin  (porcine) injection 2,850 Units ( Intravenous MAR Hold 11/12/23 1016)   0.9% NaCl infusion (0 mL/hr Intravenous Stopped 11/11/23 1053)   RisaQuad capsule 1 capsule ( Oral Automatically Held 11/16/23 0900)   0.9% NaCl infusion (0 mL/hr Intravenous Stopped 11/12/23 0200)   gabapentin  (NEURONTIN ) capsule 300 mg ( Oral Automatically Held 11/17/23 2200)   0.9% NaCl infusion ( Intravenous Stopped 11/11/23 1806)   heparin  25,000 units in dextrose  5% 500 mL infusion (premix) (0 Units/kg/hr  56.8 kg Intravenous Stopped 11/12/23 0817)   aspirin  chewable tablet 81 mg ( Oral Automatically Held 11/15/23 0900)   gelatin adsorbable (GELFOAM) sponge (1 each Topical Given 11/12/23 1230)   thrombin  5000 units topical (10,000 Units Topical Given 11/12/23 1230)   0.9% NaCl infusion (500 mLs  New Bag 11/12/23 1235)   heparin  (porcine) injection (5,000 Units Subcutaneous Given 11/12/23 1235)   sodium chloride  irrigation 0.9 % (1,000 mLs Irrigation Given 11/12/23 1230)   piperacillin -tazobactam (ZOSYN ) 4.5 g in  sterile water  (preservative free) 20 mL IV push injection (4.5 g Intravenous Given 11/04/23 1307)   vancomycin  (VANCOCIN ) 1,000 mg in sodium chloride  0.9 % 250 mL IVPB Vial2Bag (0 mg Intravenous Stopped 11/04/23 1523)   vancomycin  (VANCOCIN ) 750 mg in sodium chloride  0.9 % 250 mL IVPB Vial2Bag (0 mg Intravenous Stopped 11/05/23 1543)   sodium chloride  0.9 % bolus 250 mL (0 mLs Intravenous Stopped 11/09/23 0751)   heparin  1000 units/500 mL normal saline (1,000 Units Irrigation Given 11/09/23 0820)   lidocaine  (XYLOCAINE ) 1 % injection (10 mLs Subcutaneous Given 11/09/23 0815)   fentaNYL  (PF) (SUBLIMAZE ) injection (50 mcg Intravenous Given 11/09/23 0824)   midazolam  (VERSED ) injection (1 mg Intravenous Given 11/09/23 0824)   heparin  (porcine) injection (5,500 Units Intravenous Given 11/09/23 0834)   clopidogrel  (PLAVIX ) tablet (150 mg Oral Given 11/09/23 0856)   iodixanol  (VISIPAQUE ) 320 MG/ML injection (55 mLs Intra-arterial Given 11/09/23 0914)   midazolam  (VERSED ) injection (1 mg Intravenous Given 11/11/23 1650)   fentaNYL  (PF) (SUBLIMAZE ) injection (50 mcg Intravenous Given 11/11/23 1650)   heparin  1000 units/500 mL normal saline (3,000 Units Irrigation Given 11/11/23 1652)   heparin  (porcine) injection (5,000 Units Intravenous Given 11/11/23 1702)   lidocaine  (XYLOCAINE ) 1 % injection (5 mLs Subcutaneous Given 11/11/23 1651)   heparin  (porcine) injection (2,000 Units Intravenous Given 11/11/23 1720)   midazolam  (VERSED ) injection (1 mg Intravenous Given 11/11/23 1757)   fentaNYL  (PF) (SUBLIMAZE ) injection (25 mcg Intravenous Given 11/11/23 1757)     Laboratory and Imaging Studies:  Results for orders placed or performed during the hospital encounter of 11/04/23 (from the past 24 hours)    Collection Time: 11/11/23  3:31 PM   Result Value    Whole Blood Glucose POCT 124 (H)   ACT Kaolin POCT    Collection Time: 11/11/23  5:45 PM   Result Value    ACT Kaolin POCT 210 (H)    Collection Time: 11/11/23 10:33 PM    Result Value    Whole Blood Glucose POCT 294 (  H)   Basic Metabolic Panel    Collection Time: 11/12/23  3:28 AM   Result Value    Glucose 287 (H)    BUN 29 (H)    Creatinine 1.9 (H)    Calcium  8.3    Sodium 137    Potassium 4.3    Chloride 107    CO2 21    Anion Gap 9.0    GFR 36.3 (L)    Collection Time: 11/12/23  7:08 AM   Result Value    Whole Blood Glucose POCT 220 (H)   CBC without Differential    Collection Time: 11/12/23  8:00 AM   Result Value    WBC 7.40    Hemoglobin 7.8 (L)    Hematocrit 24.2 (L)    Platelet Count 217    MPV 9.7    RBC 2.85 (L)    MCV 84.9    MCH 27.4    MCHC 32.2    RDW 15    nRBC % 0.0    Absolute nRBC 0.00   Type and Screen    Collection Time: 11/12/23  8:00 AM   Result Value    ABO Rh A Pos    Antibody Screen Negative    Type And Screen Expiration 11/15/2023 23:59   RBC O.R. HOLD Blood Product, 2 Units    Collection Time: 11/12/23  8:00 AM   Result Value    RBC Leukoreduced RBC LR    Unit Number [id number]    Product Status issued    Product Code E0336V00    ISBT CODE 6200    Expiration Date 797488767640    RBC Leukoreduced RBC LR    Unit Number [id number]    Product Status transfused    Product Code E0336V00    ISBT CODE 6200    Expiration Date 797488757640   Weak D, Reflex    Collection Time: 11/12/23  8:00 AM   Result Value    Weak D Typing POS    Collection Time: 11/12/23 11:08 AM   Result Value    Blood Type Confirmation A POS   Weak D, Reflex    Collection Time: 11/12/23 11:08 AM   Result Value    Weak D Typing POS    Collection Time: 11/12/23 11:12 AM   Result Value    Whole Blood Glucose POCT 147 (H)     Attestations     I am the primary attending physician of record for this patient.     *This note was generated by the Epic / Dragon speech recognition system and may contain errors or omissions not intended by the user. Grammatical errors, random word insertions, deletions, pronoun errors and incomplete sentences are occasional consequences of this technology due to  software limitations. Not all errors are caught or corrected. If there are questions or concerns about the content of this note or information contained within the body of this dictation they should be addressed directly with the author for clarification.*     *This note may contain test results that returned after care was transitioned to another provider or after discharge from the ED. These results are automatically entered in the chart by the electronic medical record, and the expectation is that a subsequent provider will act on any notable results.*           [1]  Past Medical History:  Diagnosis Date   . Hyperlipidemia    . Hypertension    . Insomnia    .  Irregular heart beat    . Restless leg    . Sleep apnea    [2]  Past Surgical History:  Procedure Laterality Date   . BACK SURGERY     . SPINAL CORD STIMULATOR IMPLANT     [3]  Social History  Tobacco Use   . Smoking status: Every Day     Current packs/day: 1.00     Types: Cigarettes   . Smokeless tobacco: Never   Vaping Use   . Vaping status: Never Used   Substance Use Topics   . Alcohol use: Never   . Drug use: Never   [4]  No family history on file.  [5]  Allergies  Allergen Reactions   . Strawberry C [Ascorbate] Hives   . Strawberry Extract Hives      Cammie Chiquita BRAVO, MD  11/12/23 1414

## 2023-11-04 NOTE — Consults (Cosign Needed)
 Foot & Ankle Surgery Consultation Note  Spectra  [phone] / Pager [phone]      Date Time: 11/04/23 2:53 PM  Patient Name: Scott Monroe, KREIS.  Attending Physician: Kantak, Sumeeta A, MD  Consulting Physician: Rea Agent, DPM      Reason for Consultation:   Right hallux wound    Assessment:   Principal Problem:    Bacteremia  Resolved Problems:    * No resolved hospital problems. *    Scott Yorel Redder. is a 78 y.o. male with PMH T2DM (A1c 7.78 2 months ago), HTN, HLD, Afib on Eliquis , smoker, who presents for R hallux OM.  Admitted by medicine for IV antibiotics and further workup of bacteremia 2/2 UTI versus right hallux OM on 11/04/2023.     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    Operative Procedures:  S/p bedside I&D 11/02/2023 from previous admission    Vitals  - HR 93, RR 20, AF     Labs  - WBC 8.3, Hb 12.3, Glu 160, 10/18 ESR 41 and CRP 1.7       Micro/path  - Bedside Cx 10/18: MG Pseudomonas aeruginosa  - BCx 10/18: GNR  - Urine culture: Klebsiella pneumonia, E. coli    Imaging  - XR 10/18: cortical erosions of the hallux distal phalanx consistent with OM   - NIVS R ABI 0.34, TBI 0.14, L ABI 0.27 TBI 0.15     Plan:   - Possible OR plans pending vascular recommendations and clearance for healing potential for a hallux amputation.  - Recommend consults to IDP and vascular, specifically Dr. Dia.  - ABX: Vanco Zosyn  - Heel Weight Bearing to the right lower extremity  - DVT ppx per primary: Eliquis   - Wound Care: Betadine, 4x4 gauze, Kerlix, and Webril.  Dressings to be changed on rounds tomorrow by podiatry  - Plan discussed with attending, Dr. Rea Agent, DPM. Thank you for the consult Pascal Essie LABOR, MD.        Rosco Maize, DPM  Resident - Foot and Ankle Surgery   [facility]   [phone] (Podiatry On Call Pager)        History of Present Illness:   Scott Monroe. is a 78 y.o. male who presents to the hospital with right  hallux wound.  He  complains of pain to the right hallux for which she had previously presented to the ED for over the weekend and was discharged on p.o. antibiotics.  He reports that he first noticed this pain mid-September when he had his nails clipped by a podiatrist at his acute rehab.  Reports he was called back for positive blood cultures and has not taken his antibiotics due to his insurance coverage and is unable to afford them.  He admits to smoking multiple cigarettes a day.  Denies F/N/V/SOB.    Review of Systems:   A comprehensive review of systems was: Negative except as noted in the HPI.    Physical Exam:   BP 146/73   Pulse (!) 130   Temp 97.9 F (36.6 C) (Oral)   Resp 18   Ht 1.727 m (5' 8)   Wt 56.2 kg (124 lb)   SpO2 99%   BMI 18.85 kg/m     Gen: NAD, alert and oriented x3    Lower Extremity Exam:  Derm:  - Right: wound at the lateral halluxal nail border with gangrenous changes to the  nail bed, 2cc seropurulent drainage expressed through previous incision site at the lateral nail border. Probes to bone, erythema extending to the MPJ level  - Left: No open lesions, no interdigital maceration, no clinical signs of infection      Vasc:  - Right: pulses nonpalpable, dopplers monophasic  - Left: DP non-palpable, PT non-palpable    Neuro:  - Right: Gross sensation intact. Gross motor function intact.  - Left: Gross sensation intact. Gross motor function intact.    MSK:  - Right: compartments soft, nontender, compressible.  Pain on palpation of the right hallux  - Left: No gross deformities, compartments soft, nontender, compressible    Past Medical History:   Medical History[1]    Past Surgical History:   Past Surgical History[2]    Family History:   Family History[3]    Social History:   Social History[4]    Allergies:   Allergies[5]    Medications:   Prescriptions Prior to Admission[6]    Labs:     Results for orders placed or performed during the hospital encounter of 11/04/23 (from the  past 24 hours)   Comprehensive Metabolic Panel    Collection Time: 11/04/23 11:01 AM   Result Value    Glucose 160 (H)    BUN 27    Creatinine 1.5    Sodium 140    Potassium 4.9    Chloride 110    CO2 21    Calcium  9.6    Anion Gap 9.0    GFR 48.2 (L)    AST (SGOT) 25    ALT 17    Alkaline Phosphatase 110    Albumin 3.9    Protein, Total 7.2    Globulin 3.3    Albumin/Globulin Ratio 1.2    Bilirubin, Total 0.2   Lactic Acid    Collection Time: 11/04/23 11:01 AM   Result Value    Whole Blood Lactic Acid 1.7   CBC with Differential (Component)    Collection Time: 11/04/23 11:01 AM   Result Value    WBC 8.30    Hemoglobin 12.3 (L)    Hematocrit 38.1    Platelet Count 231    MPV 9.6    RBC 4.48    MCV 85.0    MCH 27.5    MCHC 32.3    RDW 15    nRBC % 0.0    Absolute nRBC 0.00    Preliminary Absolute Neutrophil Count 5.23    Neutrophils % 63.0    Lymphocytes % 23.4    Monocytes % 8.1    Eosinophils % 3.7    Basophils % 1.1    Immature Granulocytes % 0.7    Absolute Neutrophils 5.23    Absolute Lymphocytes 1.94    Absolute Monocytes 0.67    Absolute Eosinophils 0.31    Absolute Basophils 0.09 (H)    Absolute Immature Granulocytes 0.06       Radiology:   No results found.             [1]   Past Medical History:  Diagnosis Date    Hyperlipidemia     Hypertension     Insomnia     Irregular heart beat     Restless leg     Sleep apnea    [2]   Past Surgical History:  Procedure Laterality Date    BACK SURGERY      SPINAL CORD STIMULATOR IMPLANT     [3] No family history on file.  [  4]   Social History  Socioeconomic History    Marital status: Married   Tobacco Use    Smoking status: Every Day     Current packs/day: 1.00     Types: Cigarettes    Smokeless tobacco: Never   Vaping Use    Vaping status: Never Used   Substance and Sexual Activity    Alcohol use: Never    Drug use: Never     Social Drivers of Psychologist, prison and probation services Strain: Low Risk  (01/15/2022)    Received from [place] [facility]    Overall  Financial Resource Strain (CARDIA)     Difficulty of Paying Living Expenses: Not hard at all   Food Insecurity: No Food Insecurity (11/02/2023)    Hunger Vital Sign     Worried About Running Out of Food in the Last Year: Never true     Ran Out of Food in the Last Year: Never true   Recent Concern: Food Insecurity - Food Insecurity Present (08/29/2023)    Received from [facility]    Hunger Vital Sign     Within the past 12 months, you worried that your food would run out before you got the money to buy more.: Sometimes true     Within the past 12 months, the food you bought just didn't last and you didn't have money to get more.: Sometimes true   Transportation Needs: No Transportation Needs (11/02/2023)    PRAPARE - Therapist, art (Medical): No     Lack of Transportation (Non-Medical): No   Recent Concern: Transportation Needs - Unmet Transportation Needs (08/29/2023)    Received from [place] [facility]    [facility] - Transportation     In the past 12 months, has lack of transportation kept you from medical appointments or from getting medications?: Yes     In the past 12 months, has lack of transportation kept you from meetings, work, or from getting things needed for daily living?: Yes   Physical Activity: Inactive (04/22/2021)    Received from [facility]    Exercise Vital Sign     On average, how many days per week do you engage in moderate to strenuous exercise (like a brisk walk)?: 0 days     On average, how many minutes do you engage in exercise at this level?: 0 min   Stress: No Stress Concern Present (01/15/2022)    Received from [facility]    [facility] - Occupational Stress Questionnaire     Feeling of Stress : Only a little   Social Connections: Socially Isolated (01/15/2022)    Received from [place] [facility]    Social Connection and  Isolation Panel     In a typical week, how many times do you talk on the phone with family, friends, or neighbors?: More than three times a week     How often do you get together with friends or relatives?: Never     How often do you attend church or religious services?: Never     Do you belong to any clubs or organizations such as church groups, unions, fraternal or athletic groups, or school groups?: No     How often do you attend meetings of the clubs or organizations you belong to?: Never     Are you married, widowed, divorced,  separated, never married, or living with a partner?: Separated   Intimate Partner Violence: Not At Risk (11/02/2023)    Humiliation, Afraid, Rape, and Kick questionnaire     Fear of Current or Ex-Partner: No     Emotionally Abused: No     Physically Abused: No     Sexually Abused: No   Housing Stability: Not At Risk (11/02/2023)    Housing Stability NCSS     Do you have housing?: Yes     Are you worried about losing your housing?: No   [5]   Allergies  Allergen Reactions    Strawberry C [Ascorbate] Hives   [6]   Medications Prior to Admission   Medication Sig Dispense Refill Last Dose/Taking    atorvastatin  (LIPITOR) 80 MG tablet Take 1 tablet (80 mg) by mouth nightly 30 tablet 0     atorvastatin  (Lipitor) 80 MG tablet Take 1 tablet (80 mg) by mouth once daily 30 tablet 0     cephALEXin (KEFLEX) 500 MG capsule Take 1 capsule (500 mg) by mouth 4 (four) times daily for 7 days 28 capsule 0     furosemide  (LASIX ) 40 MG tablet Take 0.5 tablets (20 mg) by mouth as needed (if you gain 2 lbs in 24h or 5 lbs in 5 days) 30 tablet 0     gabapentin  (NEURONTIN ) 300 MG capsule Take 1 capsule (300 mg) by mouth every 8 (eight) hours 90 capsule 0     gabapentin  (NEURONTIN ) 300 MG capsule Take 2 capsules (600 mg) by mouth 3 (three) times daily 72 capsule 0     insulin  NPH-regular 70-30 MIXTURE (HumuLIN  70/30) (70-30) 100 UNIT/ML injection Inject 30 units with breakfast and 20 units with dinner 10 mL 0      Insulin  Pen Needle (BD Pen Needle Micro U/F) 32G X 6 MM Misc Use to inject insulin  into the skin 2 times per day. Ok to substitute with needle length and/or gauge of choice. 100 each 0     metoprolol  succinate XL (TOPROL -XL) 25 MG 24 hr tablet Take 1 tablet (25 mg) by mouth 2 (two) times daily 60 tablet 0     metoprolol  tartrate (LOPRESSOR ) 25 MG tablet Take 1 tablet (25 mg) by mouth 2 (two) times daily Hold for blood pressure lower than 100/60 60 tablet 0     pantoprazole  (PROTONIX ) 40 MG tablet Take 1 tablet (40 mg) by mouth every morning before breakfast 30 tablet 0     sulfamethoxazole-trimethoprim (BACTRIM DS) 800-160 MG per tablet Take 1 tablet by mouth 2 (two) times daily for 7 days 14 tablet 0     tamsulosin  (FLOMAX ) 0.4 MG Cap Take 1 capsule (0.4 mg) by mouth Daily after dinner 30 capsule 0     traZODone  (DESYREL ) 50 MG tablet Take 1 tablet (50 mg) by mouth nightly 30 tablet 0     valsartan  (DIOVAN ) 40 MG tablet Take 1 tablet (40 mg) by mouth daily 30 tablet 0

## 2023-11-04 NOTE — ED Triage Notes (Signed)
 Pt seen here on Saturday for diabetic ulcer in R big toe. Sent home with ATB for UTI, pt states I have not started taking them yet Called today for +blood cultures. Denies CP or SOB. Foley in place. Resp even and unlabored Aox4

## 2023-11-04 NOTE — ED Notes (Signed)
 Podiatry at bedside

## 2023-11-04 NOTE — H&P (Addendum)
 ADMISSION HISTORY AND PHYSICAL EXAM    Date Time: 11/04/23 3:41 PM  Patient Name: Scott Monroe, Scott Monroe.  Attending Physician: Francelia Mclaren A, MD  Primary Care Physician: Pcp, None, MD    CC: Gram negative rod bacteremia       Assessment:   Toan Mort. is a 78 y.o. male with PMH T2DM, HTN, HLD, A-fib (on Eliquis ), recent ED visit on 11/02/2023 for R hallux OM w/ overlying abscess, who presents after being found to have GNR bacteremia on bcx. Tachycardic, afebrile, without leukocytosis, recent wound cx growing pseudomonas. Currently not meeting SIRS criteria, overall c/w GNR bacteremia in setting of R distal hallux OM, admitted for IV abx and further podiatry management.  Plan:   #GNR bacteremia   - tachycardic, afebrile, without leukocytosis, currently not meeting SIRS criteria   - possibly 2/2 hematogenous seeding from R hallux osteomyelitis   - initiate abx with zosyn to cover PSA given PSA growth on wound cx   - send repeat bcx today     #R distal great toe osteomyelitis   - CRP 1.7, ESR 41, wound cx w/ growth of pseudomonas aeruginosa, staph lugdunensis   - recently seen in the ED on 11/02/2023 for R hallux OM w/ overlying abscess   - was d/c home with cephalexin prescription, did not fill/take  - podiatry consulted and following, fluid cx sent today, f/u recs     #UTI  - UA w/ large LE, +nitrite, WBC; was prescribed bactrim, not filled  - cont zosyn     #anemia  - hgb 11.0, unclear baseline, no signs or symptoms of bleeding, CTM    #elevated creatinine   - BL Cr 1.3, today 1.5, CTM     #Afib with RVR  - cont eliquis  5mg  BID     #CAD ppx  - cont aspirin  81mg , pt states has been on it for 25 years    #T2DM  - gabepentin 600mg  TID   - hold metformin  1000mg  BID, reports has not been on insulin  , initiate SSI while inpt    #HTN  - metoprolol  25mg  BID  - cont valsartan  40mg , confirm with [facility] , if Cr worsens     #HLD  - atorvastatin  80mg      #bladder mass   - urology has evaluated,  recommend outpatient cystoscopy    #insomnia   - trazodone  50mg  qhs, pt reports he needs more to sleep, start with 50mg  titrate as needed     #Global  DVT Ppx - eliquis  5mg  BID  Diet - carb consistent diabetic   Telemetry: Y/N and indication Y, A fib   Code Status: Discussion had with pt Code status is full code    Attending Attestation:     I have seen and personally examined the patient.  I agree with the history, exam, assessment, and management plans as documented by Dr. Jan.  I concur with or have edited all elements of the provider's note, with any caveats as follows    Assessment:  # GNR bacteremia likely Pseudomonas  # recent Pseudomonas, staph lugdunensis 10/18  # right toe osteomyelitis  # UTI  # mild AKI on CKD  # hx Afib  # CAD   #DM2  # HTN   # HLD  # recent bladder mass  -- cont zosyn, I added vanco  -- check MRSA nares  -- fu outpatient cultures  -- discussed with podiatry team they will reswab in ED  --  podiatry team to reach out to Dr Marinus vascular team  -- IDP consulted  -- cont eliquis /ASA  -- monitor Cr on valsartan  hold if rising  -- cont atorvastatin     Essie DELENA Oh, MD           Patient has a BMI of 18.85 kg/m2    No additional diagnosis     Recent Labs   Lab 11/04/23  1101 11/02/23  1050   Hemoglobin 12.3* 11.3*   Hematocrit 38.1 34.5*   MCV 85.0 84.4   WBC 8.30 8.13   Platelet Count 231 217         Anemia Diagnosis: No new diagnosis     Laboratory Evidence of Acidosis:  Recent Labs   Lab 11/04/23  1101 11/02/23  1050   CO2 21 18        Acidosis Diagnosis: No additional diagnosis         Disposition: (Please see PAF column for Expected D/C Date)   Today's date: 11/04/2023  Admit Date: 11/04/2023 10:40 AM  Service status: Inpatient: risk of morbidity and mortality, risk of progressive disease, and risk of readmission  Clinical Milestones: TBD  Anticipated discharge needs: TBD    History of Presenting Illness:   Scott Monroe. is a 78 y.o. male with PMH T2DM, HTN, HLD,  A-fib (on Eliquis ) who presents after being called in for findings of gram negative rods on blood culture from a recent admission.    Patient was here over the weekend for severe right toe pain and swelling that was keeping him awake at night, and was evaluated by podiatry. R toe X-ray findings were c/w osteomyelitis. Podiatry drained an abscess at the site which was overlying the R distal phalanx. Wound cx grew pseudamonas. He was cleared from podiatry standpoint for discharge on broad-spectrum oral antibiotics (cephalexin, bactrim) to follow-up outpatient for osteo management, however, he was called back in today because his blood cultures were growing gram neg rods. He was also dx with UTI over the weekend based on UA results. He states he has not picked up or started his prescribed antibiotics.     He endorses pain with urination. Denies chest pain, SOB, lightheadedness, or dizziness.   Past Medical History:   Medical History[1]    Available old records reviewed, including:  Care everywhere     Past Surgical History:   Past Surgical History[2]    Family History:   Family History[3]     Social History:   Tobacco Use History[4]  Social History     Substance and Sexual Activity   Alcohol Use Never     Social History     Substance and Sexual Activity   Drug Use Never       Allergies:   Allergies[5]    Medications:     Home Medications       Med List Status: In Progress Set By: Nicholaus Mungo, RN at 11/04/2023 11:04 AM              apixaban  (ELIQUIS ) 5 MG tablet     Take 1 tablet (5 mg) by mouth every 12 (twelve) hours     aspirin  EC 81 MG EC tablet     Take 1 tablet (81 mg) by mouth once daily     atorvastatin  (LIPITOR) 80 MG tablet     Take 1 tablet (80 mg) by mouth nightly     atorvastatin  (Lipitor) 80 MG tablet     Take  1 tablet (80 mg) by mouth once daily     cephALEXin (KEFLEX) 500 MG capsule     Take 1 capsule (500 mg) by mouth 4 (four) times daily for 7 days     furosemide  (LASIX ) 40 MG tablet     Take 0.5  tablets (20 mg) by mouth as needed (if you gain 2 lbs in 24h or 5 lbs in 5 days)     gabapentin  (NEURONTIN ) 300 MG capsule     Take 1 capsule (300 mg) by mouth every 8 (eight) hours     gabapentin  (NEURONTIN ) 300 MG capsule     Take 2 capsules (600 mg) by mouth 3 (three) times daily     insulin  NPH-regular 70-30 MIXTURE (HumuLIN  70/30) (70-30) 100 UNIT/ML injection     Inject 30 units with breakfast and 20 units with dinner     Insulin  Pen Needle (BD Pen Needle Micro U/F) 32G X 6 MM Misc     Use to inject insulin  into the skin 2 times per day. Ok to substitute with needle length and/or gauge of choice.     metFORMIN  (GLUCOPHAGE ) 1000 MG tablet     Take 1 tablet (1,000 mg) by mouth 2 (two) times daily with meals     metoprolol  succinate XL (TOPROL -XL) 25 MG 24 hr tablet     Take 1 tablet (25 mg) by mouth 2 (two) times daily     metoprolol  tartrate (LOPRESSOR ) 25 MG tablet     Take 1 tablet (25 mg) by mouth 2 (two) times daily Hold for blood pressure lower than 100/60     pantoprazole  (PROTONIX ) 40 MG tablet     Take 1 tablet (40 mg) by mouth every morning before breakfast     sulfamethoxazole-trimethoprim (BACTRIM DS) 800-160 MG per tablet     Take 1 tablet by mouth 2 (two) times daily for 7 days     tamsulosin  (FLOMAX ) 0.4 MG Cap     Take 1 capsule (0.4 mg) by mouth Daily after dinner     traZODone  (DESYREL ) 50 MG tablet     Take 1 tablet (50 mg) by mouth nightly     valsartan  (DIOVAN ) 40 MG tablet     Take 1 tablet (40 mg) by mouth daily              Method by which medications were confirmed on admission: in person interview and chart review     Review of Systems:   All other systems were reviewed and are negative    Physical Exam:   Patient Vitals for the past 24 hrs:   BP Temp Temp src Pulse Resp SpO2 Height Weight   11/04/23 1448 146/73 97.9 F (36.6 C) Oral (!) 130 18 99 % -- --   11/04/23 1345 151/70 -- -- 93 -- 99 % -- --   11/04/23 1330 186/86 -- -- 88 20 99 % -- --   11/04/23 1300 169/78 -- -- 81 -- 99 %  -- --   11/04/23 1200 154/67 -- -- 98 -- 100 % -- --   11/04/23 1100 140/85 -- -- (!) 102 -- 99 % -- --   11/04/23 1028 (!) 161/95 97.5 F (36.4 C) Oral 96 18 100 % 1.727 m (5' 8) 56.2 kg (124 lb)   11/04/23 1023 -- -- -- (!) 113 -- 99 % -- --     Body mass index is 18.85 kg/m.  No intake or output data in the 24  hours ending 11/04/23 1541    General: awake, alert, oriented x 3; no acute distress.  HEENT: perrla, eomi, sclera anicteric  oropharynx clear without lesions, mucous membranes moist  Neck: supple, no lymphadenopathy, no thyromegaly, no JVD, no carotid bruits  Cardiovascular: regular rate and rhythm, no murmurs, rubs or gallops  Lungs: clear to auscultation bilaterally, without wheezing, rhonchi, or rales  Abdomen: soft, non-tender, non-distended; no palpable masses, no hepatosplenomegaly, normoactive bowel sounds, no rebound or guarding  Extremities: R hallux with wound dressing in place, c/d/I   Neuro: cranial nerves grossly intact, strength 5/5 in upper and lower extremities, sensation intact,   Skin: no rashes or lesions noted  Labs:     Results for orders placed or performed during the hospital encounter of 11/04/23 (from the past 24 hours)   Comprehensive Metabolic Panel    Collection Time: 11/04/23 11:01 AM   Result Value    Glucose 160 (H)    BUN 27    Creatinine 1.5    Sodium 140    Potassium 4.9    Chloride 110    CO2 21    Calcium  9.6    Anion Gap 9.0    GFR 48.2 (L)    AST (SGOT) 25    ALT 17    Alkaline Phosphatase 110    Albumin 3.9    Protein, Total 7.2    Globulin 3.3    Albumin/Globulin Ratio 1.2    Bilirubin, Total 0.2   Lactic Acid    Collection Time: 11/04/23 11:01 AM   Result Value    Whole Blood Lactic Acid 1.7   CBC with Differential (Component)    Collection Time: 11/04/23 11:01 AM   Result Value    WBC 8.30    Hemoglobin 12.3 (L)    Hematocrit 38.1    Platelet Count 231    MPV 9.6    RBC 4.48    MCV 85.0    MCH 27.5    MCHC 32.3    RDW 15    nRBC % 0.0    Absolute nRBC 0.00     Preliminary Absolute Neutrophil Count 5.23    Neutrophils % 63.0    Lymphocytes % 23.4    Monocytes % 8.1    Eosinophils % 3.7    Basophils % 1.1    Immature Granulocytes % 0.7    Absolute Neutrophils 5.23    Absolute Lymphocytes 1.94    Absolute Monocytes 0.67    Absolute Eosinophils 0.31    Absolute Basophils 0.09 (H)    Absolute Immature Granulocytes 0.06       Imaging personally reviewed, including:   No results found.     Safety Checklist  DVT prophylaxis:  CHEST guideline (See page e199S) Chemical   Foley:  [place] Rn Foley protocol Present and continue: Indication: Urinary retention   IVs:  Peripheral IV   PT/OT: Not needed   Daily CBC & or Chem ordered:  SHM/ABIM guidelines (see #5) Yes, due to clinical and lab instability       Signed by: Rudie DELENA Fast, MD  cc:Pcp, None, MD            [1]   Past Medical History:  Diagnosis Date    Hyperlipidemia     Hypertension     Insomnia     Irregular heart beat     Restless leg     Sleep apnea    [2]   Past Surgical History:  Procedure Laterality  Date    BACK SURGERY      SPINAL CORD STIMULATOR IMPLANT     [3] No family history on file.  [4]   Social History  Tobacco Use   Smoking Status Every Day    Current packs/day: 1.00    Types: Cigarettes   Smokeless Tobacco Never   [5]   Allergies  Allergen Reactions    Strawberry C [Ascorbate] Hives

## 2023-11-04 NOTE — Plan of Care (Signed)
 Problem: Pain interferes with ability to perform ADL  Goal: Pain at adequate level as identified by patient  Outcome: Progressing  Flowsheets (Taken 11/04/2023 2313)  Pain at adequate level as identified by patient:   Identify patient comfort function goal   Assess for risk of opioid induced respiratory depression, including snoring/sleep apnea. Alert healthcare team of risk factors identified.   Assess pain on admission, during daily assessment and/or before any as needed intervention(s)   Reassess pain within 30-60 minutes of any procedure/intervention, per Pain Assessment, Intervention, Reassessment (AIR) Cycle   Evaluate if patient comfort function goal is met   Offer non-pharmacological pain management interventions   Evaluate patient's satisfaction with pain management progress   Include patient/patient care companion in decisions related to pain management as needed   Consult/collaborate with Physical Therapy, Occupational Therapy, and/or Speech Therapy     Problem: Side Effects from Pain Analgesia  Goal: Patient will experience minimal side effects of analgesic therapy  Outcome: Progressing  Flowsheets (Taken 11/04/2023 2313)  Patient will experience minimal side effects of analgesic therapy:   Prevent/manage side effects per LIP orders (i.e. nausea, vomiting, pruritus, constipation, urinary retention, etc.)   Evaluate for opioid-induced sedation with appropriate assessment tool (i.e. POSS)   Assess for changes in cognitive function   Monitor/assess patient's respiratory status (RR depth, effort, breath sounds)     Problem: Moderate/High Fall Risk Score >5  Goal: Patient will remain free of falls  Outcome: Progressing  Flowsheets (Taken 11/04/2023 2109)  High (Greater than 13):   MOD-Use of chair-pad alarm when appropriate   MOD-Remain with patient during toileting   MOD-Perform dangle, stand, walk (DSW) prior to mobilization     Problem: Compromised Sensory Perception  Goal: Sensory Perception  Interventions  Outcome: Progressing     Problem: Compromised Activity/Mobility  Goal: Activity/Mobility Interventions  Outcome: Progressing     Problem: Safety  Goal: Patient will be free from injury during hospitalization  Outcome: Progressing  Flowsheets (Taken 11/04/2023 2313)  Patient will be free from injury during hospitalization:   Provide alternative method of communication if needed (communication boards, writing)   Assess for patients risk for elopement and implement Elopement Risk Plan per policy   Ensure appropriate safety devices are available at the bedside   Hourly rounding   Use appropriate transfer methods   Include patient/ family/ care giver in decisions related to safety   Provide and maintain safe environment   Assess patient's risk for falls and implement fall prevention plan of care per policy  Goal: Patient will be free from infection during hospitalization  Outcome: Progressing  Flowsheets (Taken 11/04/2023 2313)  Free from Infection during hospitalization:   Monitor all insertion sites (i.e. indwelling lines, tubes, urinary catheters, and drains)   Assess and monitor for signs and symptoms of infection   Monitor lab/diagnostic results   Encourage patient and family to use good hand hygiene technique

## 2023-11-04 NOTE — ED Notes (Signed)
 MD spoke with this pt and offered to transfer them to an alternate [place] facility in order to expedite their inpatient care. Patient declined offer to transfer, states that they would prefer to remain at [facility] for admission.     Scott Monroe M  11/04/23 1339

## 2023-11-05 ENCOUNTER — Inpatient Hospital Stay

## 2023-11-05 DIAGNOSIS — Z01818 Encounter for other preprocedural examination: Secondary | ICD-10-CM

## 2023-11-05 LAB — CULTURE, URINE
Culture Urine: >100000 — AB
Culture Urine: >100000 — AB

## 2023-11-05 LAB — LAB USE ONLY - CBC WITH DIFFERENTIAL
Absolute Basophils: 0.1 x10 3/uL — ABNORMAL HIGH (ref 0.00–0.08)
Absolute Eosinophils: 0.31 x10 3/uL (ref 0.00–0.44)
Absolute Immature Granulocytes: 0.06 x10 3/uL (ref 0.00–0.07)
Absolute Lymphocytes: 2.13 x10 3/uL (ref 0.42–3.22)
Absolute Monocytes: 0.75 x10 3/uL (ref 0.21–0.85)
Absolute Neutrophils: 4.38 x10 3/uL (ref 1.10–6.33)
Absolute nRBC: 0 x10 3/uL (ref <=0.00)
Basophils %: 1.3 %
Eosinophils %: 4 %
Hematocrit: 31.6 % — ABNORMAL LOW (ref 37.6–49.6)
Hemoglobin: 10.2 g/dL — ABNORMAL LOW (ref 12.5–17.1)
Immature Granulocytes %: 0.8 %
Lymphocytes %: 27.6 %
MCH: 27.4 pg (ref 25.1–33.5)
MCHC: 32.3 g/dL (ref 31.5–35.8)
MCV: 84.9 fL (ref 78.0–96.0)
MPV: 9.9 fL (ref 8.9–12.5)
Monocytes %: 9.7 %
Neutrophils %: 56.6 %
Platelet Count: 215 x10 3/uL (ref 142–346)
Preliminary Absolute Neutrophil Count: 4.38 x10 3/uL (ref 1.10–6.33)
RBC: 3.72 x10 6/uL — ABNORMAL LOW (ref 4.20–5.90)
RDW: 15 % (ref 11–15)
WBC: 7.73 x10 3/uL (ref 3.10–9.50)
nRBC %: 0 /100{WBCs} (ref <=0.0)

## 2023-11-05 LAB — WHOLE BLOOD GLUCOSE POCT
Whole Blood Glucose POCT: 139 mg/dL — ABNORMAL HIGH (ref 70–100)
Whole Blood Glucose POCT: 252 mg/dL — ABNORMAL HIGH (ref 70–100)
Whole Blood Glucose POCT: 181 mg/dL — ABNORMAL HIGH (ref 70–100)
Whole Blood Glucose POCT: 124 mg/dL — ABNORMAL HIGH (ref 70–100)

## 2023-11-05 LAB — ECG 12-LEAD
Q-T Interval: 408 ms
QRS Duration: 142 ms
QTC Calculation (Bezet): 473 ms
R Axis: 22 degrees
T Axis: 41 degrees
Ventricular Rate: 81 {beats}/min

## 2023-11-05 LAB — BASIC METABOLIC PANEL
Anion Gap: 5 (ref 5.0–15.0)
BUN: 30 mg/dL — ABNORMAL HIGH (ref 9–28)
CO2: 20 meq/L (ref 17–29)
Calcium: 8.5 mg/dL (ref 7.9–10.2)
Chloride: 114 meq/L — ABNORMAL HIGH (ref 99–111)
Creatinine: 1.6 mg/dL — ABNORMAL HIGH (ref 0.5–1.5)
GFR: 44.7 mL/min/1.73 m2 — ABNORMAL LOW (ref >=60.0)
Glucose: 153 mg/dL — ABNORMAL HIGH (ref 70–100)
Potassium: 4.7 meq/L (ref 3.5–5.3)
Sodium: 139 meq/L (ref 135–145)

## 2023-11-05 LAB — VANCOMYCIN, RANDOM: Vancomycin Random: 8.7 ug/mL

## 2023-11-05 MED ORDER — HEPARIN SODIUM (PORCINE) 5000 UNIT/ML IJ SOLN
5000.0000 [IU] | Freq: Three times a day (TID) | INTRAMUSCULAR | Status: DC
Start: 2023-11-05 — End: 2023-11-05
  Administered 2023-11-05 (×2): 5000 [IU] via SUBCUTANEOUS
  Filled 2023-11-05 (×2): qty 1

## 2023-11-05 MED ORDER — VANCOMYCIN HCL 750 MG IV SOLR
750.0000 mg | Freq: Once | INTRAVENOUS | Status: AC
Start: 2023-11-05 — End: 2023-11-05
  Administered 2023-11-05: 750 mg via INTRAVENOUS
  Filled 2023-11-05: qty 0.75

## 2023-11-05 NOTE — Plan of Care (Signed)
 Problem: Pain interferes with ability to perform ADL  Goal: Pain at adequate level as identified by patient  Outcome: Progressing  Flowsheets (Taken 11/05/2023 1400)  Pain at adequate level as identified by patient:   Identify patient comfort function goal   Assess for risk of opioid induced respiratory depression, including snoring/sleep apnea. Alert healthcare team of risk factors identified.   Assess pain on admission, during daily assessment and/or before any as needed intervention(s)   Reassess pain within 30-60 minutes of any procedure/intervention, per Pain Assessment, Intervention, Reassessment (AIR) Cycle     Problem: Side Effects from Pain Analgesia  Goal: Patient will experience minimal side effects of analgesic therapy  Outcome: Progressing  Flowsheets (Taken 11/05/2023 1400)  Patient will experience minimal side effects of analgesic therapy:   Monitor/assess patient's respiratory status (RR depth, effort, breath sounds)   Assess for changes in cognitive function   Prevent/manage side effects per LIP orders (i.e. nausea, vomiting, pruritus, constipation, urinary retention, etc.)     Problem: Moderate/High Fall Risk Score >5  Goal: Patient will remain free of falls  Outcome: Progressing  Flowsheets (Taken 11/05/2023 1400)  Moderate Risk (6-13): MOD-Remain with patient during toileting     Problem: Compromised Sensory Perception  Goal: Sensory Perception Interventions  Outcome: Progressing  Flowsheets (Taken 11/05/2023 1400)  Sensory Perception Interventions: Offload heels, Pad bony prominences, Reposition q 2hrs/turn Clock, Q2 hour skin assessment under devices if present     Problem: Compromised Activity/Mobility  Goal: Activity/Mobility Interventions  Outcome: Progressing  Flowsheets (Taken 11/05/2023 1400)  Activity/Mobility Interventions: Pad bony prominences, TAP Seated positioning system when OOB, Promote PMP, Reposition q 2 hrs / turn clock, Offload heels     Problem: Safety  Goal: Patient will  be free from injury during hospitalization  Outcome: Progressing  Flowsheets (Taken 11/05/2023 1400)  Patient will be free from injury during hospitalization:   Assess patient's risk for falls and implement fall prevention plan of care per policy   Use appropriate transfer methods   Ensure appropriate safety devices are available at the bedside   Hourly rounding   Provide and maintain safe environment   Include patient/ family/ care giver in decisions related to safety  Goal: Patient will be free from infection during hospitalization  Outcome: Progressing  Flowsheets (Taken 11/05/2023 1400)  Free from Infection during hospitalization:   Monitor all insertion sites (i.e. indwelling lines, tubes, urinary catheters, and drains)   Assess and monitor for signs and symptoms of infection   Monitor lab/diagnostic results

## 2023-11-05 NOTE — Progress Notes (Signed)
 [facility]  Internal Medicine Hospitalists  Progress Note        Assessment / Plan:        Scott Brune. is a 78 y.o. male with diabetes, hypertension, hyperlipidemia, chronic HFpEF (grade 1) A-fib (on Eliquis ) with recent ED visit 11/02/2023 for right hallux osteomyelitis with overlying abscess who presented to the emergency room and was noted to have gram-negative bacteremia and UTI with + Ucx(now growing Klebsiella and E. coli).  Recent wound culture growing Pseudomonas and staph ludgenesis.    #Gram-negative bacteremia-  -Followed by ID most likely source CAUTI versus upper respiratory tract infection  - Per ID toe is a possible source as well  - Continue with Zosyn per ID    #Osteomyelitis of toe  #Pseudomonas and staph L.  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  #Bladder mass  #Urinary retention  #CAUTI - Kelbsiella/ecoli   -Continue with Zosyn per ID  - Continue with Foley catheter  - Urology recommending outpatient cystoscopy (has evaluated previously)    #Hyperlipidemia  - Continue with atorvastatin     #Insomnia  - Trazodone     #Acute on chronic renal failure  - Has not been following with specialists (is going to need a transitional care appointment prior to leaving)  - Says he has been told he has issues with renal failure in the past  - Looks like his baseline is around 1.3, already improved  - Continue to monitor  - Renal ultrasound next  - Hold valsartan     #Diabetes  #Neuropathy  - Continue gabapentin   - Holding metformin   - Sliding scale insulin     # chronic HFpEF and grade 1 DD  # Hypertension  # CAD  #A-fib  - Continue with atorvastatin   - Metoprolol  25 twice dail  - Holding valsartan  in the setting of renal failure  - Continue with aspirin   - Holding Eliquis  for possible surgical intervention,  CHA2DS2-VASc is 4 no need to bridge, start DVT prophylaxis    VTE Prophylaxis: apixaban  (ELIQUIS ) tablet    heparin  (porcine) injection 5,000 Units       Foley Catheter: Foley for an urinary retention    Venous Access: No Temporary Central Line Present    Medical Readiness for Discharge: Anticipated in 5+ Days    Open Handoff Activity in Sidebar    Current Facility-Administered Medications[1]  Infusion Meds[2]  PRN Medications[3]  Allergies[4]    Subjective:         Reports some throbbing in his lower extremity.  Says he has not been following up just recently because he has a very low income (800 dollars a month) and co-pay is too high for him to afford.  Chest pain no shortness of breath no nausea no vomiting.         Objective:      Temp:  [97.7 F (36.5 C)-98.4 F (36.9 C)] 97.7 F (36.5 C)  Heart Rate:  [89-121] 89  Resp Rate:  [17-22] 17  BP: (115-162)/(58-90) 121/73   General: awake, alert, oriented x 3; no acute distress  HEENT: anicteric sclerae, PERRL, EOMI; MMM  Cardiovascular: regular rate and rhythm; no murmurs, rubs, or gallops  Lungs: clear to auscultation bilaterally without wheezing, rhonchi, or rales  Abdomen: soft, non-distended; non-tender to palpation,  no rebound or guarding  Extremities: warm; no LE edema; no clubbing or cyanosis  Neuro: symmetric facial movements, clear speech, moving all extremities                        [1]   Current Facility-Administered Medications   Medication Dose Route Frequency    aspirin  EC  81 mg Oral Daily    atorvastatin   80 mg Oral Daily    gabapentin   600 mg Oral TID    heparin  (porcine)  5,000 Units Subcutaneous Q8H [facility]    insulin  lispro  1-5 Units Subcutaneous TID AC    metoprolol  tartrate  25 mg Oral Q12H [facility]    piperacillin-tazobactam  4.5 g Intravenous Q8H    tamsulosin   0.4 mg Oral Daily after dinner    traZODone   50 mg Oral QHS    vancomycin   Intravenous See Admin Instructions   [2]   Current Facility-Administered Medications   Medication Dose Route  Frequency Last Rate   [3]   Current Facility-Administered Medications   Medication Dose Route    acetaminophen   500 mg Oral    benzocaine-menthol  1 lozenge Buccal    benzonatate  100 mg Oral    carboxymethylcellulose sodium  1 drop Both Eyes    dextrose   15 g of glucose Oral    Or    dextrose   12.5 g Intravenous    Or    dextrose   12.5 g Intravenous    Or    glucagon  (rDNA)  1 mg Intramuscular    dextrose   15 g of glucose Oral    Or    dextrose   12.5 g Intravenous    Or    dextrose   12.5 g Intravenous    Or    glucagon  (rDNA)  1 mg Intramuscular    magnesium  sulfate  1 g Intravenous    melatonin  3 mg Oral    naloxone   0.2 mg Intravenous    potassium & sodium phosphates   2 packet Oral    potassium chloride   0-60 mEq Oral    Or    potassium chloride   0-60 mEq Oral    Or    potassium chloride   10 mEq Intravenous    saline  2 spray Each Nare   [4]   Allergies  Allergen Reactions    Strawberry C [Ascorbate] Hives    Strawberry Extract Hives

## 2023-11-05 NOTE — Consults (Signed)
 Office: [phone]     Date Time: 11/05/23 8:52 AM  Patient Name: Scott Monroe, [name]  Requesting Physician: Kingston Ronal PARAS, MD     Reason for Consultation:   BACTEREMIA    Problem List/Hospital course:   Acute Problem List and Hospital course:   Adx 10/20 for bacteremia    Rt Great toe Diabetic foot wound with asbcess  [facility] Oct 18;  Purulent drainage expressed from underlying abscess located distal lateral aspect of nailbed.  Approximately 3 cc of purulent drainage expressed, drainage was cultured.   - cx Ps ae susc P and Staph lugdunensis  - XRAY 10/18  erosive/destructive change with fragmentation in the distal  tuft and shaft of the distal phalanx, right great toe,     Bacteremia GNR  - blood cx 10/18 GNR 1 of 2 sets      Bladder Mass  Foley in place since August 2025; exchanged Oct18  - urine cx Oct 18 >100 K K pna and > 100 K E coli susc P       Pertinent Past History:  T2DM, HTN, HLD, A-fib (on Eliquis )   Adx may 2025 Osh Abd pain  Workup reveaed: retention, hydronephrosis B and bladder wall irregularity    Allergies[1]        Antimicrobials:   #2 Zosyn  #2 vanc IV  Estimated Creatinine Clearance: 31.7 mL/min (A) (based on SCr of 1.6 mg/dL (H)).    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  Soft tiossue infection of the toe, no infection changes to rest of foot  - wound w Ps ae and Staph lugdunensisi        Irregular bladder wall thickening identified Aug 2025 along with hydronephrosis and hydroureter  - may be playing a role in bacteremia     Recommendations:   CT A/P   Urology to evaluate re: previously recognized bladder abnormalities    Zosyn ok  Do not redose vancomycin    Follow podiatry plans;may  need more aggressive debridement    Follow susc Ps ae from wound  Follow GNR identification from blood  Follow susc E coli and kleb from Urine    ______________________________________________________________________  Information obtained from patient and available family members as applicable, and from detailed chart review and discussion with care team; also chart review of available outside records performed and summarized as pertinent to this presentation  I have communicated recommendations with the referring team either in person or via shared medical records system; also communicated with patient and with family members as applicable      History:   Scott Hosick. is a 78 y.o. male who presents to the hospital on 11/04/2023 with bacteremia  He had presented Oct 18 with right foot pain great toe  [facility] abscess unroodfed  Sent home on po abx  Called back for blood cx + GNR  Has a chronic foley; placed since Aug 2025  Has some bladder abnormality not fully worked up  No painonw   No GI no GU sx       Physical Exam:   BP 115/74   Pulse (!) 103   Temp  97.9 F (36.6 C) (Oral)   Resp 18   Ht 1.727 m (5' 7.99)   Wt 56.2 kg (124 lb)   SpO2 99%   BMI 18.86 kg/m     GA Alert oriented NAD  EYES No scleral icterus no conjunctival hemorrhage or palpebral lesions  HEENT no oral thrush no ulcers tongue midline no sinus tenderness  LYMPH NODES no palpable cervical axillary or inguinal LN  NECK supple no masses  SPINE no spine or paraspinal tenderness  JOINTS no obvious effusions, normal ROM in examined joints  HEART Nl S1 S2 disatn  LUNGS Clear no rales or rhonchi  ABDOMEN BS heard soft non tender no palpable masses  Foley in aplce  SKIN no rashes or lesions, no stigmata of IE  NEURO non focal, intact examined cranial nerves no focal deficits  PSYCH clam cooperative no changes of anxiety or depression  Right toe nail distorted pain and redness to right great toe  Soft tissue      Past Medical  History:   Medical History[2]    Past Surgical History:   Past Surgical History[3]    Family History:   Pertinent family history listed in HPI, and below otherwise reviewed and is non contributory      Social History:   Pertinent social history reviewed and listed above, otherwise social history non contributory    Allergies:   Allergies[4]    Lines:     Patient Lines/Drains/Airways Status       Active PICC Line / CVC Line / PIV Line / Drain / Airway / Intraosseous Line / Epidural Line / ART Line / Line / Wound / Pressure Ulcer / NG/OG Tube       Name Placement date Placement time Site Days    Peripheral IV 11/04/23 18 G Diffusion Anterior;Distal;Left Upper Arm 11/04/23  1104  Upper Arm  less than 1    Urethral Catheter Non-latex;Straight-tip 16 Fr. 11/02/23  1112  Non-latex;Straight-tip  2                    *I have performed a risk-benefit analysis and the patient needs a central line for access and IV medications    Medications:   Current Facility-Administered Medications[5]    Review of Systems:   Detailed 10 system review was done; pertinent positives and negatives listed in HPI, otherwise negative in details.      Labs:   Available labs reviewed and independently interpreted     Microbiology:   All data reviewed, independently interpreted and pertinent info summarized above    Rads:   No results found.  I reviewed images independently    Signed by: Avelina Dawley, MD, MD         [1]   Allergies  Allergen Reactions    Strawberry C [Ascorbate] Hives    Strawberry Extract Hives   [2]   Past Medical History:  Diagnosis Date    Hyperlipidemia     Hypertension     Insomnia     Irregular heart beat     Restless leg     Sleep apnea    [3]   Past Surgical History:  Procedure Laterality Date    BACK SURGERY      SPINAL CORD STIMULATOR IMPLANT     [4]   Allergies  Allergen Reactions    Strawberry C [Ascorbate] Hives    Strawberry Extract Hives   [5]   Current Facility-Administered Medications   Medication Dose  Route  Frequency    aspirin  EC  81 mg Oral Daily    atorvastatin   80 mg Oral Daily    gabapentin   600 mg Oral TID    heparin  (porcine)  5,000 Units Subcutaneous Q8H [facility]    insulin  lispro  1-5 Units Subcutaneous TID AC    metoprolol  tartrate  25 mg Oral Q12H [facility]    piperacillin-tazobactam  4.5 g Intravenous Q8H    tamsulosin   0.4 mg Oral Daily after dinner    traZODone   50 mg Oral QHS    vancomycin  750 mg Intravenous Once    vancomycin   Intravenous See Admin Instructions

## 2023-11-05 NOTE — Plan of Care (Signed)
 Foot & Ankle Surgery Plan of Care Note  [facility]  [phone] / Pager [phone]    - Plan for bedside toe amputation tomorrow p.m.  - Please hold a.m. anticoagulation if okay with other teams, heparin  gtt to be held 4 hours prior to procedure (may be continued 4 hours after)  - Okay to continue antiplatelet therapy (Plavix, aspirin ) from podiatry perspective  - AM labs prior to OR, CBC and BMP  - Appreciate medical optimization (BG < 200, K 3.5 - 5.0)      Scott Monroe, DPM  Resident - Foot and Ankle Surgery   [facility]   [phone] (Podiatry On Call Pager)

## 2023-11-05 NOTE — Progress Notes (Signed)
 Pharmacy Vancomycin Dosing Consult    Patient is on vancomycin day 2 for Bacteremia    Current Vancomycin Dose: LD of vancomycin 1000mg  IV  Target Guidance:  dosing per level due to unstable SCr  Goal trough (if applicable): <20    Other anti-infectives: Zosyn    Patient Metrics & Relevant Labs:  Estimated Creatinine Clearance: 31.7 mL/min (A) (based on SCr of 1.6 mg/dL (H)).  Recent Labs     11/05/23  0438 11/04/23  1756 11/04/23  1101   WBC 7.73 9.18 8.30   BUN 30* 32* 27   Creatinine 1.6* 1.8* 1.5     Wt Readings from Last 1 Encounters:   11/04/23 56.2 kg (124 lb)       Relevant Microbiology:  10/20 MRSA not detected  10/20 Blood cx- pending    Recent Doses/Levels    Doses: LD vancomycin 1000mg     Levels:   Recent Labs     11/05/23  0438   Vancomycin Random 8.7       Recommendations:    Will dose vancomycin 750mg  IV x once  Continue dosing per level due to Scr; now downtrending.  Obtain a random vancomycin level with AM labs on 10/22 (ordered)      Thank you for consulting pharmacy to dose Vancomycin for this patient.  We will continue to monitor, obtain levels, and adjust dosing as indicated for the duration of this consult.    Tressia Alvine PharmD [facility] BCACP  Clinical Pharmacist

## 2023-11-05 NOTE — Nursing Progress Note (Signed)
 4 eyes in 4 hours pressure injury assessment note:      Completed with: RN Ayeda   Unit & Time admitted: 5 Original 1800             Bony Prominences: Check appropriate box; if Pressure Injury is present enter Pressure Injury assessment in LDA    Occiput:                    []  Pressure Injury present  Face:                        []  Pressure Injury present  Ears:                         []  Pressure Injury present  Spine:                       []  Pressure Injury present  Shoulders:                []  Pressure Injury present  Elbows:                     []  Pressure Injury present  Sacrum/coccyx:        []  Pressure Injury present  Ischial Tuberosity:    []  Pressure Injury present  Trochanter/Hip:        []  Pressure Injury present  Knees:                      []  Pressure Injury present  Ankles:                     []  Pressure Injury present  Heels:                       []  Pressure Injury present  Other pressure areas: []  Pressure Injury location       Device related: []  Device name:         LDA completed if pressure injury present: yes/no  Consult WOCN if necessary    Other skin related issues, ie tears, rash, etc, document in Integumentary flowsheet

## 2023-11-05 NOTE — Plan of Care (Signed)
 Problem: Pain interferes with ability to perform ADL  Goal: Pain at adequate level as identified by patient  Outcome: Progressing  Flowsheets (Taken 11/05/2023 1808)  Pain at adequate level as identified by patient:   Assess for risk of opioid induced respiratory depression, including snoring/sleep apnea. Alert healthcare team of risk factors identified.   Assess pain on admission, during daily assessment and/or before any as needed intervention(s)   Evaluate if patient comfort function goal is met     Problem: Side Effects from Pain Analgesia  Goal: Patient will experience minimal side effects of analgesic therapy  Outcome: Progressing  Flowsheets (Taken 11/05/2023 1808)  Patient will experience minimal side effects of analgesic therapy:   Evaluate for opioid-induced sedation with appropriate assessment tool (i.e. POSS)   Prevent/manage side effects per LIP orders (i.e. nausea, vomiting, pruritus, constipation, urinary retention, etc.)     Problem: Moderate/High Fall Risk Score >5  Goal: Patient will remain free of falls  Outcome: Progressing     Problem: Compromised Sensory Perception  Goal: Sensory Perception Interventions  Outcome: Progressing     Problem: Safety  Goal: Patient will be free from injury during hospitalization  Outcome: Progressing

## 2023-11-05 NOTE — Progress Notes (Signed)
 Foot & Ankle Surgery Progress Note  [facility]  [phone] / Pager [phone]    Date Time: 11/05/23 5:20 AM  Patient Name: Scott Monroe, [name]  Consulting Physician: Rea Agent, [facility] Day: 2      Assessment:   Scott Champine. is a 78 y.o. male with PMH T2DM (A1c 7.78 2 months ago), HTN, HLD, Afib on Eliquis , smoker, who presents for R hallux OM.  Admitted by medicine for IV antibiotics and further workup of bacteremia 2/2 UTI versus right hallux OM on 11/04/2023.      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     Operative Procedures:  S/p bedside I&D 11/02/2023 from previous admission     Vitals  - HR 104, RR 19-22, AF     Labs  - Pending  -A1c 10/20: 7.3        Micro/path  - Bedside Cx 10/18: MG Pseudomonas aeruginosa, LG Staphylococcus lugdunensis   - BCx 10/18: GNR  - Urine culture: Klebsiella pneumonia, E. coli     Imaging  - XR 10/18: cortical erosions of the hallux distal phalanx consistent with OM   - NIVS R ABI 0.34, TBI 0.14, L ABI 0.27 TBI 0.15     Plan:   - Possible OR plans pending vascular recommendations and clearance for healing potential for a hallux amputation.  - Recommend consults to IDP and vascular, specifically Dr. Dia.  - ABX: Vanco Zosyn  - Heel Weight Bearing to the right lower extremity  - DVT ppx per primary: Eliquis   - Wound Care: Betadine, 4x4 gauze, Kerlix, and Webril.  Dressings to be changed on rounds today by podiatry  - Will discuss plan with attending, Dr. Rea Agent, DPM,  and continue to follow      Rosco Maize, DPM  Resident - Foot and Ankle Surgery   [facility]   [phone] (Podiatry On Call Pager)        Subjective:   Interval History:     Was seen at bedside today resting comfortably in bed. No unanticipated adverse events overnight.  Denies nausea, vomiting, fever, chills.    Review of Symptoms:   As per HPI, stated above.    Physical Exam:   BP 130/71   Pulse (!) 104   Temp 98.1 F (36.7 C)  (Oral)   Resp 19   Ht 1.727 m (5' 7.99)   Wt 56.2 kg (124 lb)   SpO2 97%   BMI 18.86 kg/m     Gen: NAD, alert and oriented x3  Heart: normal rate  Lung: non-labored, symmetrical chest rise, no audible wheezing  Abdomen: non-distended, soft and non-tender to palpation    Lower Extremity Exam:  Derm:  - Right: wound at the lateral halluxal nail border with gangrenous changes to the nail bed, 2cc seropurulent drainage expressed through previous incision site at the lateral nail border. Probes to bone, erythema extending to the MPJ level  - Left: No open lesions, no interdigital maceration, no clinical signs of infection       Vasc:  - Right: pulses nonpalpable, dopplers monophasic  - Left: DP non-palpable, PT non-palpable     Neuro:  - Right: Gross sensation intact. Gross motor function intact.  - Left: Gross sensation intact. Gross motor function intact.     MSK:  - Right: compartments soft, nontender, compressible.  Pain on palpation of the right hallux  - Left: No gross deformities, compartments soft, nontender,  compressible    Labs:     Recent Labs   Lab 11/04/23  1756 11/04/23  1101 11/02/23  1050   WBC 9.18 8.30 8.13   RBC 3.96* 4.48 4.09*   Hemoglobin 11.0* 12.3* 11.3*   Hematocrit 33.3* 38.1 34.5*   Glucose 167* 160* 190*   BUN 32* 27 29*   Creatinine 1.8* 1.5 1.3   Calcium  9.1 9.6 9.3   Sodium 141 140 136   Potassium 4.8 4.9 4.1   Chloride 113* 110 108   CO2 19 21 18        Microbiology:     Recent Results (from the past 360 hours)   Culture, Urine    Collection Time: 11/02/23 12:14 PM    Specimen: Urine, Clean Catch   Result Value    Culture Urine >100,000 CFU/mL Klebsiella pneumoniae (A)    Culture Urine >100,000 CFU/mL Escherichia coli (A)   Culture, Blood, Aerobic And Anaerobic    Collection Time: 11/02/23  1:15 PM    Specimen: Blood, Venous   Result Value    Culture Blood No growth at 2 days   Culture, Blood, Aerobic And Anaerobic    Collection Time: 11/02/23  1:31 PM    Specimen: Blood, Venous    Result Value    Gram Stain Aerobic bottle positive for Gram negative rods (AA)   Culture And Gram Stain, Aerobic Bacteria, Wound/Tissue/Fluid    Collection Time: 11/02/23  2:21 PM    Specimen: Toe, Right; Swab   Result Value    Culture Aerobic Bacteria Moderate growth of Pseudomonas aeruginosa (A)    Culture Aerobic Bacteria Light growth of Staphylococcus lugdunensis (A)    Gram Stain Few WBCs (A)    Gram Stain No epithelial cells (A)    Gram Stain Moderate Gram positive cocci (A)   Fungal Stain (Component)    Collection Time: 11/02/23  2:21 PM    Specimen: Toe, Right; Swab   Result Value    Fungal Stain No fungal or yeast elements seen   Acid Fast Bacilli Stain (Component)    Collection Time: 11/02/23  2:21 PM    Specimen: Toe, Right; Swab   Result Value    Acid Fast Bacilli Stain No acid fast bacilli seen   Nares, MRSA (methicillin-resistant Staphylococcus aureus) Screening, PCR    Collection Time: 11/04/23  9:40 PM    Specimen: Nares; Swab   Result Value    MRSA (methicillin resistant Staphylococcus aureus) DNA Not Detected       Radiology:   No results found.

## 2023-11-05 NOTE — Consults (Signed)
 SURGICAL CONSULTATION  Team 2: Vascular Surgery, [facility]  [phone]    Date Time: 11/05/23 9:53 AM  Patient Name: Scott Monroe, [name]  Attending Physician: Dr. Janyce Ovens   Chief Complaint: claudication, R foot wound   Hospital Day: 2     HISTORY   Scott Sahagun. is a 78 y.o. male with PMHx T2DM, HTN, HLD, A. Fib on Eliquis , long time smoker (since 33s), diabetic neuropathy who presented on 10/20 with a diabetic R great toe ulcer and admitted for GNR bacteremia.     The pt states that he first noticed his R toe ulcer recently while in Rehab. He was in 30 day rehab for A. Fib with RVR and a bladder mass (of unknown etiology). There was initially a dot on the left side and then the nail fell off or was removed, and then a wound developed. Since rehab, the pt has also noted claudication in both legs with symptoms after ambulating 10 feet.     He also reports associated cramping in the left groin and baseline bilateral feet numbness from neuropathy. He denies rest pain, left foot wounds, history of TIA/CVA or MI, post prandial pain or known vascular disease or any vascular procedures.     Arterial studies were completed on 10/18 during his previous ED presentation which demonstrated R ABI 0.34/TBI 0.14 and L ABI 0.27/TBI 0.15.   On the R - calcific plaque is noted at the right femoral artery bifurcation with focal occlusions at the popliteal. Collaterals and 3 vessel runoff are noted.   On the L - calcific plaque at the distal CFA with occlusion of the SFA origin is noted with collaterals providing distal reconstitution. 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 There are significant pressure drops from the ankles to the digits in both feet.     He currently takes Eliquis  5 mg BID for A. Fib, and a daily ASA 81 mg for the past 20 years.     Vascular surgery is consulted for evaluation of perfusion to the RLE.     He also has a charted history of bilateral hydronephrosis.   He says his kidneys are not  great and his Cr today is 1.6.   He also has a bladder mass for which he is still awaiting cytoscopy for to rule out tumor vs bladder thickening. He has had a chronic foley catheter since 08/2023 which was just exchanged and positive Urine cultures. He is currently on Vanc/Zosyn under ID management.     ASSESSMENT    Scott Wesley Kuipers Jr. is a 78 y.o. male with PMHx T2DM, HTN, HLD, A. Fib on Eliquis , long time smoker (since 1960s), diabetic neuropathy , bilateral hydronephrosis, bladder mass, chronic foley for urinary retention who presented on 10/20 with a diabetic R great toe ulcer and admitted for GNR bacteremia.   Pt has significant vascular disease in both lower extremities with tissue loss of the right foot.     PLAN   Plan for RLE angiogram with possible intervention 10/25.   Continue close monitoring of LLE. Will discuss potential for LLE angio intervention to follow.   Monitor Cr; pre-hydration prior to angiogram   Hold Eliquis ; ok for heparin  gtt if continued anticoagulation is needed.   Antibiotics per ID.   Continue ASA 81 mg daily.   No SCD;s. Ambulate as able.   Q4 Neurovascular checks.   Smoking cessation.   Agree with bladder mass work up.   Care per primary team.  Thank you for the Consult.  Please call with any questions or concerns    LABS     Hematology   Recent Labs     11/05/23  0438 11/04/23  1756 11/04/23  1101   WBC 7.73 9.18 8.30   Hemoglobin 10.2* 11.0* 12.3*   Hematocrit 31.6* 33.3* 38.1   Platelet Count 215 216 231        Coagulation   No results for input(s): PT, INR, PTT in the last 72 hours.    Chemistry   Recent Labs     11/05/23  0438 11/04/23  1756 11/04/23  1101   Sodium 139 141 140   Potassium 4.7 4.8 4.9   Chloride 114* 113* 110   CO2 20 19 21    BUN 30* 32* 27   Creatinine 1.6* 1.8* 1.5   Glucose 153* 167* 160*   Calcium  8.5 9.1 9.6        Liver Function Tests   Recent Labs     11/04/23  1101   AST (SGOT) 25   ALT 17   Alkaline Phosphatase 110   Protein, Total 7.2    Albumin 3.9   Bilirubin, Total 0.2           PROBLEMS LIST   Problem List[1]        PAST MEDICAL HISTORY   Medical History[2]    PAST SURGICAL HISTORY    Past Surgical History[3]     FAMILY HISTORY    The patient has a family history of  Family History[4]    SOCIAL HISTORY    Social History[5]    ALLERGY   Allergies[6]    MEDICATIONS   Prescriptions Prior to Admission[7]    REVIEW OF SYSTEMS     A complete review of systems was performed and was negative except for the pertinent positives documented in the HPI.     PHYSICAL EXAM   Current Vitals:   Vitals:    11/05/23 0726   BP: 115/74   Pulse: (!) 103   Resp: 18   Temp: 97.9 F (36.6 C)   SpO2: 99%       Gen: Alert, NAD  HEENT: Trachea midline, sclerae anicteric, moist mucous membranes  Neuro: A+O x 3, speech clear and congruent, face symmetric, tongue midline, EOMI  Cardio: RRR  Pulm: unlabored breathing,   Abd: soft, ND, NTTP, non-palpable masses, no rebound or guarding  Healed midline abdominal incision.   Ext:   Warm, no edema  Motor:  intact.   Sensation: diminished to light touch at baseline distally.     Pulse:   Upper Ext: 2+ palpable radial pulse.   Lower Ext:  Right Left   Femoral: Non - palpable, multiphasic  Femoral: non-palpable, biphasic    Pop: monophasic Pop: monophasic    DP: absent   AT: monophasic  DP: monophasic (distal only)  AT: absent    PT: monophasic  PT: monophasic      Groin: palpable lymph nodes noted in bilateral groins.                 [1]   Patient Active Problem List  Diagnosis    Atrial fibrillation (CMS/HCC)    Primary osteoarthritis of left knee    HFrEF (heart failure with reduced ejection fraction) (CMS/HCC)    Uncontrolled type 2 diabetes mellitus with hyperglycemia (CMS/HCC)    Benign prostatic hyperplasia with incomplete bladder emptying    Atrial fibrillation with RVR (CMS/HCC)  Bacteremia   [2]   Past Medical History:  Diagnosis Date    Hyperlipidemia     Hypertension     Insomnia     Irregular heart beat     Restless  leg     Sleep apnea    [3]   Past Surgical History:  Procedure Laterality Date    BACK SURGERY      SPINAL CORD STIMULATOR IMPLANT     [4] No family history on file.  [5]   Social History  Socioeconomic History    Marital status: Divorced   Tobacco Use    Smoking status: Every Day     Current packs/day: 1.00     Types: Cigarettes    Smokeless tobacco: Never   Vaping Use    Vaping status: Never Used   Substance and Sexual Activity    Alcohol use: Never    Drug use: Never     Social Drivers of Psychologist, prison and probation services Strain: High Risk (11/04/2023)    Overall Financial Resource Strain (CARDIA)     Difficulty of Paying Living Expenses: Hard   Food Insecurity: No Food Insecurity (11/04/2023)    Hunger Vital Sign     Worried About Running Out of Food in the Last Year: Never true     Ran Out of Food in the Last Year: Never true   Recent Concern: Food Insecurity - Food Insecurity Present (08/29/2023)    [facility]    Hunger Vital Sign     Within the past 12 months, you worried that your food would run out before you got the money to buy more.: Sometimes true     Within the past 12 months, the food you bought just didn't last and you didn't have money to get more.: Sometimes true   Transportation Needs: No Transportation Needs (11/04/2023)    PRAPARE - Therapist, art (Medical): No     Lack of Transportation (Non-Medical): No   Recent Concern: Transportation Needs - Unmet Transportation Needs (08/29/2023)    Received from [facility]    [facility] - Transportation     In the past 12 months, has lack of transportation kept you from medical appointments or from getting medications?: Yes     In the past 12 months, has lack of transportation kept you from meetings, work, or from getting things needed for daily living?: Yes   Physical Activity: Inactive (04/22/2021)    Received from [facility]    Exercise Vital  Sign     On average, how many days per week do you engage in moderate to strenuous exercise (like a brisk walk)?: 0 days     On average, how many minutes do you engage in exercise at this level?: 0 min   Stress: No Stress Concern Present (01/15/2022)    Received from [facility]  [facility]    [facility] of Occupational Health - Occupational Stress Questionnaire     Feeling of Stress : Only a little   Social Connections: Socially Isolated (01/15/2022)    Received from [facility]    Social Connection and Isolation Panel     In a typical week, how many times do you talk on the phone with family, friends, or neighbors?: More than three times a week     How often do you get together with friends or  relatives?: Never     How often do you attend church or religious services?: Never     Do you belong to any clubs or organizations such as church groups, unions, fraternal or athletic groups, or school groups?: No     How often do you attend meetings of the clubs or organizations you belong to?: Never     Are you married, widowed, divorced, separated, never married, or living with a partner?: Separated   Intimate Partner Violence: Not At Risk (11/04/2023)    Humiliation, Afraid, Rape, and Kick questionnaire     Fear of Current or Ex-Partner: No     Emotionally Abused: No     Physically Abused: No     Sexually Abused: No   Housing Stability: Not At Risk (11/04/2023)    Housing Stability NCSS     Do you have housing?: Yes     Are you worried about losing your housing?: No   [6]   Allergies  Allergen Reactions    Strawberry C [Ascorbate] Hives    Strawberry Extract Hives   [7]   Medications Prior to Admission   Medication Sig Dispense Refill Last Dose/Taking    apixaban  (ELIQUIS ) 5 MG tablet Take 1 tablet (5 mg) by mouth every 12 (twelve) hours   Past Week    aspirin  EC 81 MG EC tablet Take 1 tablet (81 mg) by mouth once daily   11/04/2023 Morning    metFORMIN  (GLUCOPHAGE ) 1000 MG  tablet Take 1 tablet (1,000 mg) by mouth 2 (two) times daily with meals   Past Week    atorvastatin  (LIPITOR) 80 MG tablet Take 1 tablet (80 mg) by mouth nightly 30 tablet 0     atorvastatin  (Lipitor) 80 MG tablet Take 1 tablet (80 mg) by mouth once daily 30 tablet 0     cephALEXin (KEFLEX) 500 MG capsule Take 1 capsule (500 mg) by mouth 4 (four) times daily for 7 days 28 capsule 0     furosemide  (LASIX ) 40 MG tablet Take 0.5 tablets (20 mg) by mouth as needed (if you gain 2 lbs in 24h or 5 lbs in 5 days) 30 tablet 0     gabapentin  (NEURONTIN ) 300 MG capsule Take 1 capsule (300 mg) by mouth every 8 (eight) hours 90 capsule 0     gabapentin  (NEURONTIN ) 300 MG capsule Take 2 capsules (600 mg) by mouth 3 (three) times daily 72 capsule 0     insulin  NPH-regular 70-30 MIXTURE (HumuLIN  70/30) (70-30) 100 UNIT/ML injection Inject 30 units with breakfast and 20 units with dinner 10 mL 0     Insulin  Pen Needle (BD Pen Needle Micro U/F) 32G X 6 MM Misc Use to inject insulin  into the skin 2 times per day. Ok to substitute with needle length and/or gauge of choice. 100 each 0     metoprolol  succinate XL (TOPROL -XL) 25 MG 24 hr tablet Take 1 tablet (25 mg) by mouth 2 (two) times daily 60 tablet 0     metoprolol  tartrate (LOPRESSOR ) 25 MG tablet Take 1 tablet (25 mg) by mouth 2 (two) times daily Hold for blood pressure lower than 100/60 60 tablet 0     pantoprazole  (PROTONIX ) 40 MG tablet Take 1 tablet (40 mg) by mouth every morning before breakfast 30 tablet 0     sulfamethoxazole-trimethoprim (BACTRIM DS) 800-160 MG per tablet Take 1 tablet by mouth 2 (two) times daily for 7 days 14 tablet 0     tamsulosin  (FLOMAX ) 0.4  MG Cap Take 1 capsule (0.4 mg) by mouth Daily after dinner 30 capsule 0     traZODone  (DESYREL ) 50 MG tablet Take 1 tablet (50 mg) by mouth nightly 30 tablet 0     valsartan  (DIOVAN ) 40 MG tablet Take 1 tablet (40 mg) by mouth daily 30 tablet 0

## 2023-11-05 NOTE — Progress Notes (Signed)
 Foot & Ankle Surgery Progress Note  Spectra  [phone] / Pager [phone]    Date Time: 11/06/23 6:14 AM  Patient Name: Scott Monroe, [name]  Consulting Physician: Rea Agent, [facility] Day: 3      Assessment:   Scott Hillyard. is a 78 y.o. male with PMH T2DM (A1c 7.78 2 months ago), HTN, HLD, Afib on Eliquis , smoker, who presents for R hallux OM.  Admitted by medicine for IV antibiotics and further workup of bacteremia 2/2 UTI versus right hallux OM on 11/04/2023.      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     Operative Procedures:  S/p bedside I&D 11/02/2023 from previous admission    Vitals  - Heart rate ranged 83 to 114 other VSS, AF     Labs  - WBC 7.8  - Hemoglobin 9.8        Micro/path  - Bedside Cx 10/18: MG Pseudomonas aeruginosa, LG Staphylococcus lugdunensis, MG GPC (final)  - BCx 10/18: GNR  - Urine culture: Klebsiella pneumonia, E. coli     Imaging  - XR 10/18: cortical erosions of the hallux distal phalanx consistent with OM   - NIVS R ABI 0.34, TBI 0.14, L ABI 0.27 TBI 0.15     Plan:   - Plan for tentative bedside hallux amputation today or Thursday.  - Please hold a.m. anticoagulation if okay with other teams, heparin  gtt to be held 4 hours prior to procedure (may be continued 4 hours after)  - Okay to continue antiplatelet therapy (Plavix, aspirin ) from podiatry perspective  - AM labs prior to OR, CBC and BMP  - Appreciate medical optimization (BG < 200, K 3.5 - 5.0)  - Follow-up vascular RLE angiogram scheduled for 10/25  - Continue follow-up BCx and urine cultures  - ABX per ID: continue Zosyn  - Heel Weight Bearing to the right lower extremity  - DVT ppx per primary: Eliquis   - Wound Care: Betadine, 4x4 gauze, Kerlix, and Webril.  Dressings to be changed twice daily by podiatry  - Will discuss plan with attending, Dr. Rea Agent, DPM,  and continue to follow      Rosco Maize, DPM  Resident - Foot and Ankle Surgery   [facility]   [phone] (Podiatry On Call Pager)        Subjective:   Interval History:     Was seen at bedside today resting comfortably in bed. No unanticipated adverse events overnight.  Denies nausea, vomiting, fever, chills.    Review of Symptoms:   As per HPI, stated above.    Physical Exam:   BP 132/75   Pulse 83   Temp 97.5 F (36.4 C) (Oral)   Resp 17   Ht 1.727 m (5' 7.99)   Wt 56.2 kg (124 lb)   SpO2 97%   BMI 18.86 kg/m     Gen: NAD, alert and oriented x3  Heart: normal rate  Lung: non-labored, symmetrical chest rise, no audible wheezing  Abdomen: non-distended, soft and non-tender to palpation    Lower Extremity Exam:  Derm:  - Right: wound at the lateral halluxal nail border with gangrenous changes to the nail bed, seropurulent drainage expressed through previous incision site at the lateral nail border. Probes to bone, erythema extending to the MPJ level  - Left: No open lesions, no interdigital maceration, no clinical signs of infection       Vasc:  -  Right: pulses nonpalpable, dopplers monophasic  - Left: DP non-palpable, PT non-palpable     Neuro:  - Right: Gross sensation intact. Gross motor function intact.  - Left: Gross sensation intact. Gross motor function intact.     MSK:  - Right: compartments soft, nontender, compressible.  Pain on palpation of the right hallux  - Left: No gross deformities, compartments soft, nontender, compressible    Labs:     Recent Labs   Lab 11/06/23  0327 11/05/23  0438 11/04/23  1756 11/04/23  1101   WBC 7.80 7.73 9.18 8.30   RBC 3.54* 3.72* 3.96* 4.48   Hemoglobin 9.8* 10.2* 11.0* 12.3*   Hematocrit 30.2* 31.6* 33.3* 38.1   Glucose 163* 153* 167* 160*   BUN 35* 30* 32* 27   Creatinine 1.8* 1.6* 1.8* 1.5   Calcium  8.2 8.5 9.1 9.6   Sodium 136 139 141 140   Potassium 5.0 4.7 4.8 4.9   Chloride 110 114* 113* 110   CO2 21 20 19 21        Microbiology:     Recent Results (from the past 360 hours)   Culture, Urine    Collection Time: 11/02/23 12:14 PM    Specimen:  Urine, Clean Catch   Result Value    Culture Urine >100,000 CFU/mL Klebsiella pneumoniae (A)    Culture Urine >100,000 CFU/mL Escherichia coli (A)       Susceptibility    Escherichia coli - MIC     Amoxicillin/Clavulanic acid <=4/2 Susceptible ug/mL     Ampicillin* <=4 Susceptible ug/mL      * If oral therapy is desired AND ampicillin is susceptible, consider amoxicillin. [place] Antimicrobial Subcommittee Nov. 2020     Ampicillin/Sulbactam 2/1 Susceptible ug/mL     Aztreonam <=2 Susceptible ug/mL     Cefazolin* <=1 Susceptible ug/mL      * Cefazolin interpretation is for uncomplicated UTI only. If oral therapy is desired for uncomplicated UTI AND E.coli is susceptible to cefazolin, consider cephalexin, cefdinir, cefpodoxime, or cefprozil. [place] Antimicrobial Subcommittee Nov. 2020     Cefepime <=1 Susceptible ug/mL     Cefoxitin <=4 Susceptible ug/mL     Ceftazidime <=2 Susceptible ug/mL     Ceftriaxone* <=1 Susceptible ug/mL      * Ceftriaxone does not predict cefdinir susceptibility. [place] Antimicrobial Subcommittee Nov. 2020     Cefuroxime 8 Intermediate ug/mL     Ciprofloxacin <=0.25 Susceptible ug/mL     Ertapenem <=0.25 Susceptible ug/mL     Gentamicin <=2 Susceptible ug/mL     Levofloxacin <=0.5 Susceptible ug/mL     Meropenem <=0.5 Susceptible ug/mL     Nitrofurantoin* <=16 Susceptible ug/mL      * Nitrofurantoin should only be used for the treatment of uncomplicated cystitis. [place] [facility] Antimicrobial Subcommittee June 2015.     Piperacillin/Tazobactam <=2/4 Susceptible ug/mL     Tetracycline* <=2 Susceptible ug/mL      * Enterobacterales susceptible to tetracycline are also considered susceptible to doxycycline and minocycline. CLSI M100-ED33:2023     Trimethoprim/Sulfamethoxazole <=0.5/9.5 Susceptible ug/mL    Klebsiella pneumoniae - MIC     Amoxicillin/Clavulanic acid <=4/2 Susceptible ug/mL     Ampicillin* >16 Resistant ug/mL      * If oral therapy is desired AND ampicillin is susceptible, consider  amoxicillin. [place] Antimicrobial Subcommittee Nov. 2020     Ampicillin/Sulbactam 8/4 Susceptible ug/mL     Aztreonam <=2 Susceptible ug/mL     Cefazolin* <=1 Susceptible ug/mL      *  Cefazolin interpretation is for uncomplicated UTI only. If oral therapy is desired for uncomplicated UTI AND K. pneumoniae is susceptible to cefazolin, consider cephalexin, cefdinir, cefpodoxime, or cefprozil. [place] Antimicrobial Subcommittee Nov. 2020     Cefepime <=1 Susceptible ug/mL     Cefoxitin <=4 Susceptible ug/mL     Ceftazidime <=2 Susceptible ug/mL     Ceftriaxone* <=1 Susceptible ug/mL      * Ceftriaxone does not predict cefdinir susceptibility. [place] Antimicrobial Subcommittee Nov. 2020     Cefuroxime <=4 Susceptible ug/mL     Ciprofloxacin <=0.25 Susceptible ug/mL     Ertapenem <=0.25 Susceptible ug/mL     Gentamicin <=2 Susceptible ug/mL     Levofloxacin <=0.5 Susceptible ug/mL     Meropenem <=0.5 Susceptible ug/mL     Nitrofurantoin* 64 Intermediate ug/mL      * Nitrofurantoin should only be used for the treatment of uncomplicated cystitis. [place] [facility] Antimicrobial Subcommittee June 2015.     Piperacillin/Tazobactam 4/4 Susceptible ug/mL     Tetracycline* <=2 Susceptible ug/mL      * Enterobacterales susceptible to tetracycline are also considered susceptible to doxycycline and minocycline. CLSI M100-ED33:2023     Trimethoprim/Sulfamethoxazole <=0.5/9.5 Susceptible ug/mL   Culture, Blood, Aerobic And Anaerobic    Collection Time: 11/02/23  1:15 PM    Specimen: Blood, Venous   Result Value    Culture Blood No growth at 3 days   Culture, Blood, Aerobic And Anaerobic    Collection Time: 11/02/23  1:31 PM    Specimen: Blood, Venous   Result Value    Gram Stain Aerobic bottle positive for Gram negative rods (AA)   Culture And Gram Stain, Aerobic Bacteria, Wound/Tissue/Fluid    Collection Time: 11/02/23  2:21 PM    Specimen: Toe, Right; Swab   Result Value    Culture Aerobic Bacteria Moderate growth of Pseudomonas aeruginosa  (A)    Culture Aerobic Bacteria Moderate growth of Pseudomonas aeruginosa (A)    Culture Aerobic Bacteria Light growth of Staphylococcus lugdunensis (A)    Gram Stain Few WBCs (A)    Gram Stain No epithelial cells (A)    Gram Stain Moderate Gram positive cocci (A)       Susceptibility    Pseudomonas aeruginosa - MIC     Aztreonam 4 Susceptible ug/mL     Cefepime 2 Susceptible ug/mL     Ceftazidime <=2 Susceptible ug/mL     Ciprofloxacin <=0.25 Susceptible ug/mL     Levofloxacin <=0.5 Susceptible ug/mL     Meropenem <=0.5 Susceptible ug/mL     Piperacillin/Tazobactam 4/4 Susceptible ug/mL     Tobramycin <=2 Susceptible ug/mL    Pseudomonas aeruginosa - MIC     Aztreonam 8 Susceptible ug/mL     Cefepime 2 Susceptible ug/mL     Ceftazidime <=2 Susceptible ug/mL     Ciprofloxacin <=0.25 Susceptible ug/mL     Levofloxacin <=0.5 Susceptible ug/mL     Meropenem <=0.5 Susceptible ug/mL     Piperacillin/Tazobactam 4/4 Susceptible ug/mL     Tobramycin <=2 Susceptible ug/mL   Fungal Stain (Component)    Collection Time: 11/02/23  2:21 PM    Specimen: Toe, Right; Swab   Result Value    Fungal Stain No fungal or yeast elements seen   Acid Fast Bacilli Stain (Component)    Collection Time: 11/02/23  2:21 PM    Specimen: Toe, Right; Swab   Result Value    Acid Fast Bacilli Stain No acid fast bacilli seen   Culture,  Blood, Aerobic And Anaerobic    Collection Time: 11/04/23 11:01 AM    Specimen: Blood, Venous   Result Value    Culture Blood No growth at 1 day   Nares, MRSA (methicillin-resistant Staphylococcus aureus) Screening, PCR    Collection Time: 11/04/23  9:40 PM    Specimen: Nares; Swab   Result Value    MRSA (methicillin resistant Staphylococcus aureus) DNA Not Detected       Radiology:   No results found.

## 2023-11-05 NOTE — Progress Notes (Addendum)
..      CDU NURSING NOTE FOR THE RECEIVING INPATIENT NURSE   CDU NURSE Scott Poitras RN   [facility] [id number]   CDU CHARGE RN Scott Monroe   ADMISSION INFORMATION   Scott Monroe. is a 78 y.o. male admitted with an ED diagnosis of:    1. Bacteremia    2. PAD (peripheral artery disease)         Isolation: None   Allergies: Strawberry c [ascorbate] and Strawberry extract   Holding Orders confirmed? N/A   Belongings Documented? Yes   Home medications sent to pharmacy confirmed? N/A   NURSING CARE   Patient Comes From:   Mental Status: Home Independent  alert   ADL: Independent with all ADLs   Ambulation: mild difficulty   Pertinent Information  and Safety Concerns: please call RN     CT / NIH   CT Head ordered on this patient?  No   NIH/Dysphagia assessment done prior to admission? No   VITAL SIGNS (at the time of this note)      Vitals:    11/05/23 1132   BP: 121/73   Pulse: 89   Resp: 17   Temp: 97.7 F (36.5 C)   SpO2: 100%

## 2023-11-05 NOTE — ED Provider Notes (Shared)
 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)  Open wound of right great toe, initial encounter    Discharge Medication List as of 11/02/2023  6:06 PM        START taking these medications    Details   cephALEXin (KEFLEX) 500 MG capsule Take 1 capsule (500 mg) by mouth 4 (four) times daily for 7 days, Starting Sat 11/02/2023, Until Sat 11/09/2023, E-Rx      sulfamethoxazole-trimethoprim (BACTRIM DS) 800-160 MG per tablet Take 1 tablet by mouth 2 (two) times daily for 7 days, Starting Sat 11/02/2023, Until Sat 11/09/2023, E-Rx               Clinical Summary   {Disappearing Text  Use the Action and Data Review Links below to navigate to specific areas of the chart.  Action Links  Snapshot    Orders    Decision Support    Secure Chat    Transfer Documents    Patient Care Timeline    Workup    Dispo  Data Review Links Results Review    EKG    Labs    Imaging    Media              :[id number]}     Nursing Triage Note:    Chief Complaint: Urinary Tract Infection Symptoms    Midlevel Provider/Resident Attestation: The patient was seen and examined by the mid-level (resident, physician assistant or nurse practitioner) and the plan of care was discussed with me. I am the primary attending physician of record for this patient. I personally saw the patient, made/approved the management plan and take responsibility for the patient management.    History of Present Illness       Physical Exam    Initial Differential Diagnosis:  Initial differential diagnosis to include but not limited to: {IIK:38527}      Assessment & Plan        Final Impression:  ***  {DISPO NEUPNWD:38529}    ED Course as of 11/05/23 0735   Sat Nov 02, 2023   1226 Toes Right 2+ Vws  Paged podiatry [CM]   1239  Spoke to podiatry who will see in ED [CM]   1302 Pt continues to be tachycardic. Will order 2nd dose of Metoprolol . Podiatry at bedside.  [CM]   1422 Podiatry discussed plan. Recommend Ultrasounds in ED for outpatient followup . Abscess expressed by podiatry. Recommend d/c home with abx  [CM]      ED Course User Index  [CM] Boyce Reche SAUNDERS, PA       Medical Decision Making  Amount and/or Complexity of Data Reviewed  Labs: ordered.  Radiology: ordered. Decision-making details documented in ED Course.  ECG/medicine tests: ordered.    Risk  Prescription drug management.        Reviewed previous labs.   Reviewed previous pertinent ED visits.   Reviewed previous inpatient records as applicable.   Reviewed previous outpatient records.       The following USACS CMT is used for risk management:   {IHS ED JOO:[id number]}            I utilized my training and experience to weigh the risk of discharge against the risks of further  testing, imaging, or hospitalization. At this time, I estimate the risks of additional testing, imaging, or hospitalization to be equal to or greater than the risk of discharge. I discussed my risk assessment with the patient, and the patient consents to the risk of discharge as well as the risk of uncertainty in estimating outcomes. Given the negative workup, lack of concerning findings, and overall clinical stability, the patient does not meet criteria for hospitalization or a higher level of care. History, physical exam, and laboratory findings were discussed with the patient. Shared decision-making was conducted, and the patient is comfortable with outpatient management and understands the need for follow-up. Prior to discharge symptoms controlled, patient well appearing. I have instructed the patient to return to the ER at any time if there are any new or worsening symptoms.  The patient expressed understanding of and agreement with this plan.  Opportunity was given for questions prior to  discharge and all stated questions were answered to the patient's satisfaction.  Home care instructions provided. The patient understands the discharge plan and agrees to follow up with their primary care provider as needed.    Interpretations   {IHSEDINTERPRETATIONS (Optional):61541}    Results                     Procedures   {Please remember to document critical care time in Procedure SmartBlock if indicated:62366}   Procedures                 Supplemental Encounter Data   Medical History[1]  Past Surgical History[2]  Social History[3]  Family History[4]  Allergies[5]    Encounter Orders:  Orders Placed This Encounter   Procedures    Post-op shoe    Culture, Urine    Culture, Blood, Aerobic And Anaerobic    Culture, Blood, Aerobic And Anaerobic    Culture and Smear, Acid Fast Bacilli (AFB/Mycobacteria)(Order)    Culture and Gram Stain, Aerobic and Anaerobic Bacteria, Wound/Tissue/Fluid (Order)    Culture and Smear, Fungal (Order)    Culture, Acid Fast Bacilli (AFB/Mycobacteria)(Component)    Culture And Gram Stain, Aerobic Bacteria, Wound/Tissue/Fluid    Culture, Fungal (Component)    Fungal Stain (Component)    Acid Fast Bacilli Stain (Component)    Toes Right 2+ Vws    US  Arterial/ Graft Duplex Dopp Low Extrem Bil Comp    US  Noninvasive Low Extrem Art Dopp/Press/Wavefrms (Abi-Ppg) Ltd 1-2 Lvls    Urinalysis with Reflex to Microscopic Exam and Culture    Urine [place]nor Culture Hold Tube    CBC with Differential (Order)    Basic Metabolic Panel    CBC with Differential (Component)    Sedimentation Rate (ESR)    C Reactive Protein    Lactic Acid    Insert Foley Catheter    ECG 12 Lead    Saline Lock     Medications Administered:  Medications   metoprolol  succinate XL (TOPROL -XL) 24 hr tablet 25 mg (25 mg Oral Given 11/02/23 1105)   sodium chloride  0.9 % bolus 500 mL (0 mLs Intravenous Stopped 11/02/23 1213)   cefTRIAXone (ROCEPHIN) 1 g in sterile water (preservative free) 10 mL IV push injection (1 g Intravenous  Given 11/02/23 1331)   metoprolol  succinate XL (TOPROL -XL) 24 hr tablet 25 mg (25 mg Oral Given 11/02/23 1331)   gabapentin  (NEURONTIN ) capsule 600 mg (600 mg Oral Given 11/02/23 1504)   sulfamethoxazole-trimethoprim (BACTRIM DS) 800-160 MG per tablet 1 tablet (1 tablet Oral Given 11/02/23  1504)     Laboratory and Imaging Studies:     US  Noninvasive Low Extrem Art Dopp/Press/Wavefrms (Abi-Ppg) Ltd 1-2 Lvls   Final Result      1.Bilateral ABI and TBI consistent with severe peripheral arterial disease.   Severe multifocal atherosclerotic disease throughout the bilateral lower   extremity arteries.   2.Right lower extremity: Focal occlusion in the mid/distal popliteal artery   with distal reconstitution. Three-vessel runoff with weakly biphasic   waveforms.   3.Left lower extremity: Pulses parvus and tardus waveforms in the proximal   common femoral artery in setting of known common iliac artery stenosis.   Occlusion of the superficial femoral artery with reconstitution of the   popliteal artery. Three-vessel runoff with weakly monophasic waveforms.      Findings above discussed with ED PA Reche Dadds via telephone at 6:43 PM   on 11/02/2023 with verbal confirmation.      [name], MD   11/02/2023 6:43 PM      US  Arterial/ Graft Duplex Dopp Low Extrem Bil Comp   Final Result      1.Bilateral ABI and TBI consistent with severe peripheral arterial disease.   Severe multifocal atherosclerotic disease throughout the bilateral lower   extremity arteries.   2.Right lower extremity: Focal occlusion in the mid/distal popliteal artery   with distal reconstitution. Three-vessel runoff with weakly biphasic   waveforms.   3.Left lower extremity: Pulses parvus and tardus waveforms in the proximal   common femoral artery in setting of known common iliac artery stenosis.   Occlusion of the superficial femoral artery with reconstitution of the   popliteal artery. Three-vessel runoff with weakly monophasic waveforms.       Findings above discussed with ED PA Reche Dadds via telephone at 6:43 PM   on 11/02/2023 with verbal confirmation.      [name], MD   11/02/2023 6:43 PM      Toes Right 2+ Vws   Final Result      1.Prominent erosive/destructive change with fragmentation in the distal   tuft and shaft of the distal phalanx, right great toe, concerning for   osteomyelitis and pathologic fracture.   2.Nonhealing wound in the dorsal aspect of the right great toe overlying   the distal phalanx and at the region of the nailbed.      [name], MD   11/02/2023 12:14 PM             [1]   Past Medical History:  Diagnosis Date    Hyperlipidemia     Hypertension     Insomnia     Irregular heart beat     Restless leg     Sleep apnea    [2]   Past Surgical History:  Procedure Laterality Date    BACK SURGERY      SPINAL CORD STIMULATOR IMPLANT     [3]   Social History  Tobacco Use    Smoking status: Every Day     Current packs/day: 1.00     Types: Cigarettes    Smokeless tobacco: Never   Vaping Use    Vaping status: Never Used   Substance Use Topics    Alcohol use: Never    Drug use: Never   [4] No family history on file.  [5]   Allergies  Allergen Reactions    Strawberry C [Ascorbate] Hives    Strawberry Extract Hives

## 2023-11-05 NOTE — Nursing Progress Note (Signed)
 Brief background: Scott Conde. is a 78 y.o. male with diabetes, hypertension, hyperlipidemia, chronic HFpEF (grade 1) A-fib (on Eliquis ) with recent ED visit 11/02/2023 for right hallux osteomyelitis with overlying abscess who presented to the emergency room and was noted to have gram-negative bacteremia (now growing Klebsiella and E. coli).  Recent wound culture growing Pseudomonas and staph ludgenesis.    Last 24 Hrs: Pt is A&Ox4. Pt admitted from CDU. Pt is independent uses a cane.  No complains of pain. Dressing changed done by Podiatry. Vital sign stable. Skin assessment done.      Abnormal labs/VS:     Recent Labs   Lab 11/05/23  0438   Sodium 139   Potassium 4.7   Chloride 114*   CO2 20   BUN 30*   Creatinine 1.6*   GFR 44.7*   Glucose 153*   Calcium  8.5       Recent Labs   Lab 11/05/23  0438   WBC 7.73   Hemoglobin 10.2*   Hematocrit 31.6*   Platelet Count 215         Neuro changes: A&Ox4     Respiratory: RA    Tele/ Rhythm: On Tele     GI/GU:  Foley LAST BM 11/05/23     Nutrition: Consistent Carb and Heart Healthy      Activity/ Safety/ Mobility: Independent/Cane      Skin/ Wounds: See assessment     LDA   Patient Lines/Drains/Airways Status       Active Lines, Drains and Airways       Name Placement date Placement time Site Days    Peripheral IV 11/05/23 20 G Standard Anterior;Proximal;Right Forearm 11/05/23  1430  Forearm  less than 1    Urethral Catheter Non-latex;Straight-tip 16 Fr. 11/02/23  1112  Non-latex;Straight-tip  3                     VTE prophylaxis:     Current Facility-Administered Medications (Includes Only Anticoagulants, Misc. Hematological)   Medication Dose Route Last Admin    heparin  (porcine) injection 5,000 Units  5,000 Units Subcutaneous 5,000 Units at 11/05/23 1434

## 2023-11-05 NOTE — Plan of Care (Addendum)
 Perioperative assessment:    Patient scores 2 points using the revised cardiac index (for history of CAD and HFpEF).  This correlates with a 5 % risk of major cardiac event post procedurally.  Patient is considered to be low -moderate risk for an intermediate surgery.  However, given most recent EKG 10/18 with A-fib with RVR and chronic right bundle branch block will need to repeat EKG prior to going into OR.  EKG ordered.  If no evidence of ischemia and no evidence of RVR patient may proceed with surgery without further testing.    Patient does have a history of atrial fibrillation as mentioned previously was on Eliquis  last dose taken last night around 2100.  Discontinue Eliquis  (OR is pending).  His CHA2DS2-VASc or is 4 which puts him in a moderate thrombotic risk category.  Bridging only required for patients in high thrombotic risk category (CHA2DS2-VASc 5 or greater).  Start heparin  for DVT prophylaxis restart Eliquis  as soon as possible when cleared by surgery.    Addendum: EKG seen, chronic A-fib with right bundle branch block as seen previously.  No further testing required.

## 2023-11-06 ENCOUNTER — Encounter (INDEPENDENT_AMBULATORY_CARE_PROVIDER_SITE_OTHER): Payer: Self-pay

## 2023-11-06 ENCOUNTER — Ambulatory Visit: Payer: Self-pay

## 2023-11-06 ENCOUNTER — Inpatient Hospital Stay

## 2023-11-06 LAB — LAB USE ONLY - CBC WITH DIFFERENTIAL
Absolute Basophils: 0.09 x10 3/uL — ABNORMAL HIGH (ref 0.00–0.08)
Absolute Eosinophils: 0.31 x10 3/uL (ref 0.00–0.44)
Absolute Immature Granulocytes: 0.06 x10 3/uL (ref 0.00–0.07)
Absolute Lymphocytes: 2.15 x10 3/uL (ref 0.42–3.22)
Absolute Monocytes: 0.63 x10 3/uL (ref 0.21–0.85)
Absolute Neutrophils: 4.56 x10 3/uL (ref 1.10–6.33)
Absolute nRBC: 0 x10 3/uL (ref <=0.00)
Basophils %: 1.2 %
Eosinophils %: 4 %
Hematocrit: 30.2 % — ABNORMAL LOW (ref 37.6–49.6)
Hemoglobin: 9.8 g/dL — ABNORMAL LOW (ref 12.5–17.1)
Immature Granulocytes %: 0.8 %
Lymphocytes %: 27.6 %
MCH: 27.7 pg (ref 25.1–33.5)
MCHC: 32.5 g/dL (ref 31.5–35.8)
MCV: 85.3 fL (ref 78.0–96.0)
MPV: 9.5 fL (ref 8.9–12.5)
Monocytes %: 8.1 %
Neutrophils %: 58.3 %
Platelet Count: 203 x10 3/uL (ref 142–346)
Preliminary Absolute Neutrophil Count: 4.56 x10 3/uL (ref 1.10–6.33)
RBC: 3.54 x10 6/uL — ABNORMAL LOW (ref 4.20–5.90)
RDW: 15 % (ref 11–15)
WBC: 7.8 x10 3/uL (ref 3.10–9.50)
nRBC %: 0 /100{WBCs} (ref <=0.0)

## 2023-11-06 LAB — VANCOMYCIN, RANDOM: Vancomycin Random: 10.7 ug/mL

## 2023-11-06 LAB — BASIC METABOLIC PANEL
Anion Gap: 5 (ref 5.0–15.0)
BUN: 35 mg/dL — ABNORMAL HIGH (ref 9–28)
CO2: 21 meq/L (ref 17–29)
Calcium: 8.2 mg/dL (ref 7.9–10.2)
Chloride: 110 meq/L (ref 99–111)
Creatinine: 1.8 mg/dL — ABNORMAL HIGH (ref 0.5–1.5)
GFR: 38.8 mL/min/1.73 m2 — ABNORMAL LOW (ref >=60.0)
Glucose: 163 mg/dL — ABNORMAL HIGH (ref 70–100)
Potassium: 5 meq/L (ref 3.5–5.3)
Sodium: 136 meq/L (ref 135–145)

## 2023-11-06 LAB — WHOLE BLOOD GLUCOSE POCT
Whole Blood Glucose POCT: 178 mg/dL — ABNORMAL HIGH (ref 70–100)
Whole Blood Glucose POCT: 212 mg/dL — ABNORMAL HIGH (ref 70–100)
Whole Blood Glucose POCT: 179 mg/dL — ABNORMAL HIGH (ref 70–100)

## 2023-11-06 MED ORDER — SODIUM CHLORIDE 0.9 % IV SOLN
INTRAVENOUS | Status: AC
Start: 2023-11-06 — End: 2023-11-07
  Filled 2023-11-06: qty 1000

## 2023-11-06 MED ORDER — HYDROMORPHONE HCL 2 MG PO TABS
2.0000 mg | ORAL_TABLET | ORAL | Status: DC | PRN
Start: 2023-11-06 — End: 2023-11-07
  Administered 2023-11-06 – 2023-11-07 (×2): 2 mg via ORAL
  Filled 2023-11-06 (×2): qty 1

## 2023-11-06 MED ORDER — HYDROMORPHONE HCL 1 MG/ML IJ SOLN
0.4000 mg | INTRAMUSCULAR | Status: DC | PRN
Start: 2023-11-06 — End: 2023-11-07

## 2023-11-06 MED ORDER — HEPARIN SODIUM (PORCINE) 5000 UNIT/ML IJ SOLN
5000.0000 [IU] | Freq: Three times a day (TID) | INTRAMUSCULAR | Status: DC
Start: 2023-11-06 — End: 2023-11-09
  Administered 2023-11-06 – 2023-11-08 (×7): 5000 [IU] via SUBCUTANEOUS
  Filled 2023-11-06 (×7): qty 1

## 2023-11-06 NOTE — Op Note (Signed)
 Foot & Ankle Surgery Bedside Procedure note  Spectra  [phone] / Pager [phone]      PATIENT NAME:  Scott Monroe.   78 y.o. male     DATE OF OPERATION:  11/06/23     PROVIDERS PERFORMING:  Surgeons and Role:     Laurey Lawyer, DPM - primary     *Prentiss Crawford, DPM - resident    Assistants:   Prentiss Crawford, DPM - Resident    DIAGNOSIS:  Osteomyelitis of the right hallux    PROCEDURE:  Right partial hallux amputation [CPT 28825]    ANESTHESIA:   Anesthesia type not filed in the log.   No responsible provider has been recorded for the case.   No anesthesia staff entered.     Preoperatively: 5 mL f 1% lidocaine plain     ESTIMATED BLOOD LOSS:  Less than 5 cc     HEMOSTASIS:  - Anatomical dissection, mechanical compression, electrocautery    No tourniquet was used throughout the entire procedure  * No tourniquets in log *     IMPLANTS:  * No surgical log found *     MATERIALS:   Nylon     SPECIMENS:  Right second hallux specimen for pathology  Right foot post irrigation culture for micro    ANTIBIOTICS:  Antbiotics were given as scheduled from the floor     DRAINS:  None     COMPLICATIONS:  Patient tolerated the procedure well without complication.      INDICATIONS FOR PROCEDURE:  Auston Halfmann. presents with a chief complaint of right hallux pain concerning for osteomyelitis of the distal phalanx seen on x-ray. The patient has failed conservative treatments of various modalities. After discussing all conservative and surgical options, the patient has elected to proceed with surgical intervention. All alternatives, risks, and complications of the procedures were thoroughly explained to the patient. Patient exhibits appropriate understanding of all discussion points and informed consent was signed and obtained in the chart with no guarantees to surgical outcome given or implied.      DESCRIPTION OF PROCEDURE:  Patient was in the hospital bed in the supine position.  A surgical timeout was performed and all  members of the operating room, the procedure, and surgical site were identified.  Local anesthesia was administered in a local infiltrative block using 5 mL of 1% lidocaine plain. The right lower extremity was then prepped and draped in the usual sterile manner. The following procedure was then undertaken.    PROCEDURE  Attention was directed to the 1st digit on the right foot. A full-thickness fishmouth incision encompassing the hallux interphalangeal joint was made using a #15 blade. Dissection was carried down to bone. The toe was secured with a towel clamp, further dissected in its entirety, and disarticulated at the distal inter-phalangeal joint (DIPJ) and passed to the back table as a gross specimen. This was then labled and sent to pathology. The bone was noted to be soft and eroded, and consistent with osteomyelitis.  The remaining proximal phalanx head appeared viable.  A bone cutter was used to resect the proximal phalanx head to allow for closure under minimal tension.  The remaining proximal phalanx appeared healthy. There was minimal remaining necrotic and devitalized soft tissue within the wound bed. There was minimal bleeding, noting poor perfusion. The area was then flushed with copious amounts of sterile saline. A deep tissue culture was obtained at this time. Then using the nylon, the skin was  reapproximated under minimal tension. The surgical site was then dressed with xeroform, 4x4 gauze, and kerlix.  The patient tolerated both the procedure and anesthesia well with vital signs stable throughout. The patient was transferred from the OR to recovery under the discretion of anesthesia.    CONDITION:  Patient was taken to PACU in good condition and all vital signs stable.    DISCHARGE:  The patient will remain on the floors under the primary service for continued medical management, pain control, and IV antibiotics as needed      Rosco Maize, DPM  Resident - Foot and Ankle Surgery   [facility]   [phone] (Podiatry On Call Pager)      I was present during or personally performed key portions of the service documented by the resident. I have personally reviewed the patient's history and 24 hour interval events, along with vitals, labs, radiology images and additional findings found in detail within the note. I concur with the clinical information, including the patient history, physical exam, and assessment and care plan as documented in this note. Additionally, I have edited and added to the note to reflect my exam, assessment, and plan.    I was present for the entire case and performed all aspects.  Confident that surgical cure of osteomyelitis achieved.     Lynwood DEL. Rea, [place]  Podiatric Surgery

## 2023-11-06 NOTE — Plan of Care (Addendum)
 Shift Note:  Pt is A&Ox4. RA. Pt is stand by,  independent uses a cane.  No complains of pain. Dressing changed done by Podiatry. Vital sign stable.   Foley care given. LAST BM 11/05/23 Consistent Carb and Heart Healthy. Call light and belonging within reach.     Problem: Day of Admission - Heart Failure  Goal: Heart Failure Admission  Outcome: Progressing  Flowsheets (Taken 11/06/2023 0601)  Heart Failure Admission:   Standing Weight on admission, if unable to stand zero the bed and use the bed scale   Vital signs and telemetry per policy   Strict Intake/Output   Oxygen as needed   Fluid restriction   Assess for swelling/edema and document   Initiate education with patient and caregiver using CHF Warning Zones and Educational Videos ([employer] or [employer])     Problem: Everyday - Heart Failure  Goal: Stable Vital Signs and Fluid Balance  Outcome: Progressing  Flowsheets (Taken 11/06/2023 0601)  Stable Vital Signs and Fluid Balance:   Daily Standing Weights in the morning using the same scale, after using the bathroom and before breadfast.  If unable to stand, zero the bed and use the bed scale   Monitor, assess vital signs and telemetry per policy   Strict Intake/Output   Fluid Restriction   Assess for swelling/edema   Monitor labs and report abnormalities to physician   Wean oxygen as needed if appropriate     Problem: Everyday - Heart Failure  Goal: Mobility/Activity is Maintained at Optimal Level for Patient  Outcome: Progressing  Flowsheets (Taken 11/06/2023 0601)  Mobility/Activity is Maintained at Optimal Level for Patient:   Increase mobility as tolerated/progressive mobility protocol   Perform active/passive ROM   Assess for changes in respiratory status, level of consciousness and/or development of fatigue   Reposition patient every 2 hours and as needed unless able to reposition self   Maintain SCD's as Ordered   Consult with Physical Therapy and/or Occupational Therapy     Problem: Everyday - Heart  Failure  Goal: Nutritional Intake is Adequate  Outcome: Progressing  Flowsheets (Taken 11/06/2023 0601)  Nutritional Intake is Adequate:   Cardiac diet-2 gm Sodium   Fluid Restricction if needed   Encourage/perform oral hygiene as appropriate   Patient and family teaching on low sodium diet   Consult/Collaborate with Nutritionist   Assess appetite,anorexia and amount of meal/food tolerated     Problem: Everyday - Heart Failure  Goal: Teaching-Using CHF Warning Zones and Educational Videos  Outcome: Progressing  Flowsheets (Taken 11/06/2023 0601)  Teaching-Using CHF Warning Zones and Educational Videos:   Signs & Symptoms of CHF   CHF Warning Zones and when to call for help   Daily Standing Weights & record   Medications   Document in the Education Tab in EPIC with Teach-back   Fluid Restriction if appropriate     Problem: Day of Discharge - Heart Failure  Goal: Discharge Education  Outcome: Progressing  Flowsheets (Taken 11/06/2023 0601)  Day of Discharge:   After Visit Summary (Discharge Instuctions) with medications   Follow-up appointments   Low Sodium Diet   Fluid Restriction   CHF Warning Zones and when to call for help   Daily Standing Weights

## 2023-11-06 NOTE — Progress Notes (Addendum)
 Foot & Ankle Surgery Progress Note  Spectra  [phone] / Pager [phone]    Date Time: 11/07/23 6:19 AM  Patient Name: Scott Monroe, [name]  Consulting Physician: Rea Agent, [facility] Day: 4      Assessment:   Lelon Ikard. is a 78 y.o. male with PMH T2DM (A1c 7.78 2 months ago), HTN, HLD, Afib on Eliquis , smoker, who presents for R hallux OM.  Admitted by medicine for IV antibiotics and further workup of bacteremia 2/2 UTI versus right hallux OM on 11/04/2023.      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     Operative Procedures:  S/p bedside I&D 11/02/2023 from previous admission  S/p 10/22 right partial hallux amputation closed    Vitals  - VSS, AF     Labs  - WBC 8.08  - Hemoglobin 9.8  - Glucose 199        Micro/path  -10/22 IntraOp cultures: NGTD  - 10/22 right hallux Path: Pending  - Bedside Cx 10/18: MG Pseudomonas aeruginosa, LG Staphylococcus lugdunensis, MG GPC (final)  - BCx 10/18: GNR  - Urine culture: Klebsiella pneumonia, E. coli     Imaging  - XR 10/18: cortical erosions of the hallux distal phalanx consistent with OM   - NIVS R ABI 0.34, TBI 0.14, L ABI 0.27 TBI 0.15     Plan:   -POD 1 s/p 10/22 right partial hallux amputation closed.  No further surgical plans from podiatry perspective.  - Follow-up vascular RLE angiogram scheduled for 10/25  - Continue follow-up BCx and urine cultures  - ABX per ID: continue Zosyn  - Heel Weight Bearing to the right lower extremity  - DVT ppx per primary: Eliquis   - Wound Care: Xeroform, 4x4 gauze, Kerlix, and Webril.  Please keep dressings intact until Saturday 10/25, to be changed by podiatry after RLE angio  - Will discuss plan with attending, Dr. Rea Agent, DPM,  and continue to follow      Rosco Maize, DPM  Resident - Foot and Ankle Surgery   [facility]   [phone] (Podiatry On Call Pager)      I was present during or personally performed key portions of the service documented by  the resident. I have personally reviewed the patient's history and 24 hour interval events, along with vitals, labs, radiology images and additional findings found in detail within the note. I concur with the clinical information, including the patient history, physical exam, and assessment and care plan as documented in this note. Additionally, I have edited and added to the note to reflect my exam, assessment, and plan.    Jodie Ora, DPM  Podiatric Surgery     Subjective:   Interval History:     Was seen at bedside today resting comfortably in bed. No unanticipated adverse events overnight.  Denies nausea, vomiting, fever, chills.    Review of Symptoms:   As per HPI, stated above.    Physical Exam:   BP 140/82   Pulse 73   Temp 97.9 F (36.6 C) (Oral)   Resp 17   Ht 1.727 m (5' 7.99)   Wt 56.2 kg (124 lb)   SpO2 96%   BMI 18.86 kg/m     Gen: NAD, alert and oriented x3  Heart: normal rate  Lung: non-labored, symmetrical chest rise, no audible wheezing  Abdomen: non-distended, soft and non-tender to palpation    Lower Extremity Exam:  Derm:  - Right: Dressing CDI  - Left: No open lesions, no interdigital maceration, no clinical signs of infection       Vasc:  - Right: pulses nonpalpable, dopplers monophasic  - Left: DP non-palpable, PT non-palpable     Neuro:  - Right: Gross sensation intact. Gross motor function intact.  - Left: Gross sensation intact. Gross motor function intact.     MSK:  - Right: compartments soft, nontender, compressible.  Pain on palpation of the right hallux  - Left: No gross deformities, compartments soft, nontender, compressible    Labs:     Recent Labs   Lab 11/07/23  0358 11/06/23  0327 11/05/23  0438 11/04/23  1756   WBC 8.08 7.80 7.73 9.18   RBC 3.51* 3.54* 3.72* 3.96*   Hemoglobin 9.8* 9.8* 10.2* 11.0*   Hematocrit 29.8* 30.2* 31.6* 33.3*   Glucose 199* 163* 153* 167*   BUN 27 35* 30* 32*   Creatinine 1.5 1.8* 1.6* 1.8*   Calcium  8.4 8.2 8.5 9.1   Sodium 136 136 139 141    Potassium 4.9 5.0 4.7 4.8   Chloride 109 110 114* 113*   CO2 21 21 20 19        Microbiology:     Recent Results (from the past 360 hours)   Culture, Urine    Collection Time: 11/02/23 12:14 PM    Specimen: Urine, Clean Catch   Result Value    Culture Urine >100,000 CFU/mL Klebsiella pneumoniae (A)    Culture Urine >100,000 CFU/mL Escherichia coli (A)       Susceptibility    Escherichia coli - MIC     Amoxicillin/Clavulanic acid <=4/2 Susceptible ug/mL     Ampicillin* <=4 Susceptible ug/mL      * If oral therapy is desired AND ampicillin is susceptible, consider amoxicillin. [place] Antimicrobial Subcommittee Nov. 2020     Ampicillin/Sulbactam 2/1 Susceptible ug/mL     Aztreonam <=2 Susceptible ug/mL     Cefazolin* <=1 Susceptible ug/mL      * Cefazolin interpretation is for uncomplicated UTI only. If oral therapy is desired for uncomplicated UTI AND E.coli is susceptible to cefazolin, consider cephalexin, cefdinir, cefpodoxime, or cefprozil. [place] Antimicrobial Subcommittee Nov. 2020     Cefepime <=1 Susceptible ug/mL     Cefoxitin <=4 Susceptible ug/mL     Ceftazidime <=2 Susceptible ug/mL     Ceftriaxone* <=1 Susceptible ug/mL      * Ceftriaxone does not predict cefdinir susceptibility. [place] Antimicrobial Subcommittee Nov. 2020     Cefuroxime 8 Intermediate ug/mL     Ciprofloxacin <=0.25 Susceptible ug/mL     Ertapenem <=0.25 Susceptible ug/mL     Gentamicin <=2 Susceptible ug/mL     Levofloxacin <=0.5 Susceptible ug/mL     Meropenem <=0.5 Susceptible ug/mL     Nitrofurantoin* <=16 Susceptible ug/mL      * Nitrofurantoin should only be used for the treatment of uncomplicated cystitis. [place] [facility] Antimicrobial Subcommittee June 2015.     Piperacillin/Tazobactam <=2/4 Susceptible ug/mL     Tetracycline* <=2 Susceptible ug/mL      * Enterobacterales susceptible to tetracycline are also considered susceptible to doxycycline and minocycline. CLSI M100-ED33:2023     Trimethoprim/Sulfamethoxazole <=0.5/9.5 Susceptible  ug/mL    Klebsiella pneumoniae - MIC     Amoxicillin/Clavulanic acid <=4/2 Susceptible ug/mL     Ampicillin* >16 Resistant ug/mL      * If oral therapy is desired AND ampicillin is susceptible, consider amoxicillin. [place] Antimicrobial Subcommittee Nov.  2020     Ampicillin/Sulbactam 8/4 Susceptible ug/mL     Aztreonam <=2 Susceptible ug/mL     Cefazolin* <=1 Susceptible ug/mL      * Cefazolin interpretation is for uncomplicated UTI only. If oral therapy is desired for uncomplicated UTI AND K. pneumoniae is susceptible to cefazolin, consider cephalexin, cefdinir, cefpodoxime, or cefprozil. [place] Antimicrobial Subcommittee Nov. 2020     Cefepime <=1 Susceptible ug/mL     Cefoxitin <=4 Susceptible ug/mL     Ceftazidime <=2 Susceptible ug/mL     Ceftriaxone* <=1 Susceptible ug/mL      * Ceftriaxone does not predict cefdinir susceptibility. [place] Antimicrobial Subcommittee Nov. 2020     Cefuroxime <=4 Susceptible ug/mL     Ciprofloxacin <=0.25 Susceptible ug/mL     Ertapenem <=0.25 Susceptible ug/mL     Gentamicin <=2 Susceptible ug/mL     Levofloxacin <=0.5 Susceptible ug/mL     Meropenem <=0.5 Susceptible ug/mL     Nitrofurantoin* 64 Intermediate ug/mL      * Nitrofurantoin should only be used for the treatment of uncomplicated cystitis. [place] [facility] Antimicrobial Subcommittee June 2015.     Piperacillin/Tazobactam 4/4 Susceptible ug/mL     Tetracycline* <=2 Susceptible ug/mL      * Enterobacterales susceptible to tetracycline are also considered susceptible to doxycycline and minocycline. CLSI M100-ED33:2023     Trimethoprim/Sulfamethoxazole <=0.5/9.5 Susceptible ug/mL   Culture, Blood, Aerobic And Anaerobic    Collection Time: 11/02/23  1:15 PM    Specimen: Blood, Venous   Result Value    Culture Blood No growth at 4 days   Culture, Blood, Aerobic And Anaerobic    Collection Time: 11/02/23  1:31 PM    Specimen: Blood, Venous   Result Value    Gram Stain Aerobic bottle positive for Gram negative rods (AA)   Culture  And Gram Stain, Aerobic Bacteria, Wound/Tissue/Fluid    Collection Time: 11/02/23  2:21 PM    Specimen: Toe, Right; Swab   Result Value    Culture Aerobic Bacteria Moderate growth of Pseudomonas aeruginosa (A)    Culture Aerobic Bacteria Moderate growth of Pseudomonas aeruginosa (A)    Culture Aerobic Bacteria Light growth of Staphylococcus lugdunensis (A)    Gram Stain Few WBCs (A)    Gram Stain No epithelial cells (A)    Gram Stain Moderate Gram positive cocci (A)       Susceptibility    Pseudomonas aeruginosa - MIC     Aztreonam 4 Susceptible ug/mL     Cefepime 2 Susceptible ug/mL     Ceftazidime <=2 Susceptible ug/mL     Ciprofloxacin <=0.25 Susceptible ug/mL     Levofloxacin <=0.5 Susceptible ug/mL     Meropenem <=0.5 Susceptible ug/mL     Piperacillin/Tazobactam 4/4 Susceptible ug/mL     Tobramycin <=2 Susceptible ug/mL    Pseudomonas aeruginosa - MIC     Aztreonam 8 Susceptible ug/mL     Cefepime 2 Susceptible ug/mL     Ceftazidime <=2 Susceptible ug/mL     Ciprofloxacin <=0.25 Susceptible ug/mL     Levofloxacin <=0.5 Susceptible ug/mL     Meropenem <=0.5 Susceptible ug/mL     Piperacillin/Tazobactam 4/4 Susceptible ug/mL     Tobramycin <=2 Susceptible ug/mL   Fungal Stain (Component)    Collection Time: 11/02/23  2:21 PM    Specimen: Toe, Right; Swab   Result Value    Fungal Stain No fungal or yeast elements seen   Acid Fast Bacilli Stain (Component)    Collection  Time: 11/02/23  2:21 PM    Specimen: Toe, Right; Swab   Result Value    Acid Fast Bacilli Stain No acid fast bacilli seen   Culture, Blood, Aerobic And Anaerobic    Collection Time: 11/04/23 11:01 AM    Specimen: Blood, Venous   Result Value    Culture Blood No growth at 2 days   Nares, MRSA (methicillin-resistant Staphylococcus aureus) Screening, PCR    Collection Time: 11/04/23  9:40 PM    Specimen: Nares; Swab   Result Value    MRSA (methicillin resistant Staphylococcus aureus) DNA Not Detected   Culture And Gram Stain, Aerobic Bacteria,  Wound/Tissue/Fluid    Collection Time: 11/06/23 12:05 PM    Specimen: Foot, Right; Swab   Result Value    Gram Stain No squamous epithelial cells seen    Gram Stain Few WBCs    Gram Stain No organisms seen       Radiology:   XR Foot Right AP Lateral And Oblique  Result Date: 11/06/2023  Partial amputation of the right hallux at the proximal phalangeal neck. [name], MD 11/06/2023 1:29 PM    US  Renal Kidney  Result Date: 11/06/2023   1.No hydronephrosis. 2.Mild asymmetric atrophy of the right kidney when compared to the left. No cortical thinning or increased echogenicity to suggest further evidence of chronic renal disease. 3.Prostatomegaly. [name], MD 11/06/2023 9:52 AM    XR Chest AP Portable  Result Date: 11/06/2023  No acute cardiopulmonary process.    [name], MD 11/06/2023 9:43 AM

## 2023-11-06 NOTE — Progress Notes (Signed)
 Case Management location: onsite    LOS # 2      CASE MANAGEMENT PROGRESS NOTE:    Patient: Scott Monroe, Scott Monroe. (78 y.o., male)  Admission Date: 11/04/2023  Hospital Day: 2  Admission Diagnosis:Bacteremia [R78.81]      Summary of Discharge Plan:  Home, may need [facility] services  ITC referral    Identified Possible Discharge Barriers:  S/P hallux amputation today  PT/OT evals pending  ID following, pending final recs    CM Interventions and Outcome:  Per CM chart review, pt not medically ready for discharge. Anticipate [place] in 1-2 days.    CM met with patient at bedside and introduced self and role of CM. Pt plans to [place] home when medically ready.    Discussed above Discharge Plan with (patient, family, Care Team, others):  Care team, patient    CM will continue to follow for transition of care needs.     Sari Forster RN, BSN  Case Manager I  [facility]

## 2023-11-06 NOTE — Progress Notes (Signed)
 [facility]  Internal Medicine Hospitalists  Progress Note        Assessment / Plan:        Scott Monroe. is a 78 y.o. male with diabetes, hypertension, hyperlipidemia, chronic HFpEF (grade 1) A-fib (on Eliquis ) with recent ED visit 11/02/2023 for right hallux osteomyelitis with overlying abscess who presented to the emergency room and was noted to have gram-negative bacteremia and UTI with + Ucx(now growing Klebsiella and E. coli).  Recent wound culture growing Pseudomonas and staph ludgenesis.    #Gram-negative bacteremia-requiring longer incubation.  -Followed by ID most likely source CAUTI versus upper respiratory tract infection  - Per ID toe is a possible source as well  - Continue with Zosyn per ID    #Osteomyelitis of toe  #Pseudomonas and staph L.  - Possible angio Thursday versus Saturday  - Status post bedside amputation.  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  #Bladder mass  #Urinary retention  #CAUTI - Kelbsiella/ecoli   -Continue with Zosyn per ID  - Continue with Foley catheter  - Urology recommending outpatient cystoscopy (has evaluated previously)    #Hyperlipidemia  - Continue with atorvastatin     #Insomnia  - Trazodone     #Acute on chronic renal failure  - Has not been following with specialists (is going to need a transitional care appointment prior to leaving)  - Says he has been told he has issues with renal failure in the past  - Looks like his baseline is around 1.3, however it is now worsening, theoretically could be contrast-induced nephropathy from CT angio done in 10/18.  - Since angio is planned for tomorrow will get nephrology involved and start IV fluids  - Continue to monitor  - Renal ultrasound without evidence of hydronephrosis mild asymmetry of right kidney when compared to left but no echogenicity to suggest evidence of chronic renal disease, prostamegaly  noted  - Hold valsartan     #Diabetes  #Neuropathy  - Continue gabapentin   - Holding metformin   - Sliding scale insulin     # chronic HFpEF and grade 1 DD  # Hypertension  # CAD  #A-fib  - Continue with atorvastatin   - Metoprolol  25 twice daily  - Holding valsartan  in the setting of renal failure  - Continue with aspirin   - Holding Eliquis  for possible surgical intervention (angio tomorrow), CHA2DS2-VASc is 4 no need to bridge, continue with DVT prophylaxis    VTE Prophylaxis: apixaban  (ELIQUIS ) tablet       Foley Catheter: Foley for an urinary retention    Venous Access: No Temporary Central Line Present    Medical Readiness for Discharge: Anticipated in 5+ Days    Open Handoff Activity in Sidebar    Current Facility-Administered Medications[1]  Infusion Meds[2]  PRN Medications[3]  Allergies[4]    Subjective:         Reports some throbbing in his lower extremity.  Says he has not been following up just recently because he has a very low income (800 dollars a month) and co-pay is too high for him to afford.  Chest pain no shortness of breath no nausea no vomiting.         Objective:      Temp:  [97.5 F (36.4 C)-99 F (37.2 C)] 99 F (37.2 C)  Heart Rate:  [80-114] 95  Resp Rate:  [15-18] 18  BP: (131-180)/(66-82) 155/81   General: awake, alert, oriented x 3; no acute distress  HEENT: anicteric sclerae, PERRL, EOMI; MMM  Cardiovascular: regular rate and rhythm; no murmurs, rubs, or gallops  Lungs: clear to auscultation bilaterally without wheezing, rhonchi, or rales  Abdomen: soft, non-distended; non-tender to palpation, no rebound or guarding  Extremities: warm; no LE edema; no clubbing or cyanosis  Neuro: symmetric facial movements, clear speech, moving all extremities                          [1]   Current Facility-Administered Medications   Medication Dose Route Frequency    aspirin  EC  81 mg Oral Daily    atorvastatin   80 mg Oral Daily    gabapentin   600 mg Oral TID    insulin  lispro  1-5 Units Subcutaneous  TID AC    metoprolol  tartrate  25 mg Oral Q12H SCH    piperacillin-tazobactam  4.5 g Intravenous Q8H    tamsulosin   0.4 mg Oral Daily after dinner    traZODone   50 mg Oral QHS   [2]   Current Facility-Administered Medications   Medication Dose Route Frequency Last Rate   [3]   Current Facility-Administered Medications   Medication Dose Route    acetaminophen   500 mg Oral    benzocaine-menthol  1 lozenge Buccal    benzonatate  100 mg Oral    carboxymethylcellulose sodium  1 drop Both Eyes    dextrose   15 g of glucose Oral    Or    dextrose   12.5 g Intravenous    Or    dextrose   12.5 g Intravenous    Or    glucagon  (rDNA)  1 mg Intramuscular    dextrose   15 g of glucose Oral    Or    dextrose   12.5 g Intravenous    Or    dextrose   12.5 g Intravenous    Or    glucagon  (rDNA)  1 mg Intramuscular    HYDROmorphone   0.4 mg Intravenous    HYDROmorphone   2 mg Oral    magnesium  sulfate  1 g Intravenous    melatonin  3 mg Oral    naloxone   0.2 mg Intravenous    potassium & sodium phosphates   2 packet Oral    potassium chloride   0-60 mEq Oral    Or    potassium chloride   0-60 mEq Oral    Or    potassium chloride   10 mEq Intravenous    saline  2 spray Each Nare   [4]   Allergies  Allergen Reactions    Strawberry C [Ascorbate] Hives    Strawberry Extract Hives

## 2023-11-06 NOTE — Plan of Care (Addendum)
 Foot & Ankle Surgery Bedside Procedure Protocol/Checklist    Date Time: 11/06/23 11:00 AM    Patient Name: Scott Monroe, Scott Monroe.    Attending Physician: Dr. Rea Agent, DPM     1. Consent has been given by: Patient    2. Nursing staff aware of procedure and able to witness consent    3. Sterile instrumentation has been checked out under the patient name and MRN from Main OR/SPD.     4. Appropriate PPE donned, including but not limited to sterile gowns, gloves, shoe cover, masks, and eye protection     5. Surgical time-out performed, confirming patient, allergies, site, side and planned procedure, as well as appropriate disposal or transport of human tissue/path etc. Nursing staff witnessed and confirmed compliance.     Date Time: 11/06/23 9:28 PM    6. Approved pre procedure analgesia administered consisting of 5 mL of 1% lidocaine plain    7. Surgical skin prep utilizing Betadine prep in standard fashion     8. [employer], clear of all patient belongings and all other items not related to the procedure.     9. Amputation(s) not to be performed proximal to the MTPJ.      10. Disposal/Transport of all removed tissue should be done in designated biohazard, pathology specimen, or culture containers, with appropriate labels for patient.     I attest that the above information is accurate and complete; Yes    Rea Agent, D.P.M. was present during all core portions of the procedure performed in the note.       Rosco Maize, DPM  Resident - Foot and Ankle Surgery   [facility] - PGY-1   [phone] (Podiatry On Call Pager)      **This note was generated in part using speech recognition software and may contain errors or omissions not intended by the user. Grammatical errors, random word insertions, deletions, pronoun errors and incomplete sentences are occasional consequences of this technology due to software limitations. Not all errors are caught or corrected.**

## 2023-11-06 NOTE — Consults (Signed)
 CONSULTATION                                                                   Date Time: 11/06/23 2:56 PM  Primary care provide: Pcp, None, MD  Requesting Physician: Kingston Ronal PARAS, MD  Consulting physician:Miyanna Wiersma LOISE Pickett, MD    Reason for consultation: AKI      Assessment/Recommendations:   AKI on CKD3a-suspect could be related to septic ATN from bacteremia with some possible contribution from contrast associated AKI given contrast exposure reportedly on 10/18.  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  Urology recommends outpatient cystoscopy.  CKD could be related to atherosclerotic disease and hypertension with diabetic nephropathy  Continue 0.9% saline infusion  Supportive care.  48 to 64 ounces oral fluid hydration daily  Avoid NSAID.  If IV contrast were to be required, recommend 0.9% saline infusion 50 cc/h 6 hours pre and postcontrast  Bacteremia.  Osteomyelitis of toe.  Blood culture growing gram-negative rods.  Vascular surgery with plan for right lower extremity angiogram with possible intervention 10/25.  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  BP reasonable.  Continue Lopressor  25 mg twice daily.  Appears euvolemic.  No need for diuretic for now  Type 2 diabetes with CKD.  Reportedly not yet on long-term use of insulin .  Primary team managing.  Metformin  currently held    I would like to thank you for the opportunity to participate in the care of this pleasant patient. Will follow along with you.     History:   Scott Wedig. is a 78 y.o. male who presented to the hospital on 11/04/2023 with concern for foot wound.  Has type 2 diabetes, hypertension, chronic diastolic heart failure, CKD 3, BPH, urinary retention status post chronic indwelling Foley catheter    Nephrology is  consulted for elevated serum creatinine.  Patient states that due to his current social situation with difficulty arranging for transportation, he has not been able to follow-up with any physicians since August 2025.  States that back in August 2025, he was told to have urinary retention status post Foley catheter placement.  He was not able to follow-up with urology on discharge due to transportation issue.  Most recently, he presented to ED on Saturday due to concern for wound in his foot.  He states that there was finding of pus.  Wound care was done and patient was also told at that time that he has a UTI although he was not experiencing any fever, chills, suprapubic tenderness or flank pain.  States that he was prescribed antibiotics on discharge however due to transportation he was not able to pick up the prescription.  On Monday, he was told that blood culture obtained from ED grew a bacteria and he should come back to the hospital for treatment.  His prior serum creatinine was around 1.1-1.2 with estimated GFR around 59 mL/min.  Most recently when he presented to ED on 10/18, his serum creatinine was 1.3 with estimated GFR around 57 mL/min.  On hospital readmission, his serum creatinine continued to increase to 1.5 and at the time of consult, it has risen to 1.8  Past Medical History:   Medical History[1]    Past Surgical History:   Past Surgical History[2]  Family History:   Family History[3]  Social History:   Social History[4]  Allergies:   Allergies[5]  Medications:   Current Facility-Administered Medications[6]      Physical Exam:     Vitals:    11/06/23 0733 11/06/23 0810 11/06/23 1223 11/06/23 1242   BP: 137/82 131/66 180/78 155/81   Pulse: 82 84 95    Resp: 16  16 18    Temp: 98.4 F (36.9 C)  99 F (37.2 C)    TempSrc: Oral  Oral    SpO2: 99%  99%    Weight:       Height:           Intake/Output Summary (Last 24 hours) at 11/06/2023 1456  Last data filed at 11/06/2023 0517  Gross per 24 hour    Intake 550 ml   Output 650 ml   Net -100 ml     Patient is awake. Lying in bed in NAD. Responding to questions appropriately.   Pale conjunctiva  No JVD  Chest clear to auscultation bilaterally.  Normal heart sounds. No murmur  Abd soft and nontender  No LE edema  No skin rash  No muscle deformity.     Labs Reviewed:     Recent Labs   Lab 11/06/23  0327 11/05/23  0438 11/04/23  1756   WBC 7.80 7.73 9.18   Hemoglobin 9.8* 10.2* 11.0*   Hematocrit 30.2* 31.6* 33.3*   Platelet Count 203 215 216     Recent Labs   Lab 11/06/23  0327 11/05/23  0438 11/04/23  1756 11/04/23  1101   Sodium 136 139 141 140   Potassium 5.0 4.7 4.8 4.9   Chloride 110 114* 113* 110   CO2 21 20 19 21    BUN 35* 30* 32* 27   Creatinine 1.8* 1.6* 1.8* 1.5   GFR 38.8* 44.7* 38.8* 48.2*   Glucose 163* 153* 167* 160*   Calcium  8.2 8.5 9.1 9.6   Albumin  --   --   --  3.9     Recent Labs   Lab 11/06/23  1225 11/06/23  0744 11/05/23  1938 11/05/23  1640 11/05/23  1133 11/05/23  0725 11/04/23  1653   Whole Blood Glucose POCT 212* 178* 124* 181* 252* 139* 167*     Rads:   Radiological Procedure reviewed.   Scott LOISE Pickett, MD  [phone]  [phone] (F)  11/06/2023           [1]   Past Medical History:  Diagnosis Date    Hyperlipidemia     Hypertension     Insomnia     Irregular heart beat     Restless leg     Sleep apnea    [2]   Past Surgical History:  Procedure Laterality Date    BACK SURGERY      SPINAL CORD STIMULATOR IMPLANT     [3] No family history on file.  [4]   Social History  Socioeconomic History    Marital status: Divorced   Tobacco Use    Smoking status: Every Day     Current packs/day: 1.00     Types: Cigarettes    Smokeless tobacco: Never   Vaping Use    Vaping status: Never Used   Substance and Sexual Activity    Alcohol use: Never    Drug use: Never  Social Drivers of Psychologist, prison and probation services Strain: High Risk (11/04/2023)    Overall Financial Resource Strain (CARDIA)     Difficulty of Paying Living Expenses: Hard   Food  Insecurity: No Food Insecurity (11/04/2023)    Hunger Vital Sign     Worried About Running Out of Food in the Last Year: Never true     Ran Out of Food in the Last Year: Never true   Recent Concern: Food Insecurity - Food Insecurity Present (08/29/2023)    Received from [facility]    Hunger Vital Sign     Within the past 12 months, you worried that your food would run out before you got the money to buy more.: Sometimes true     Within the past 12 months, the food you bought just didn't last and you didn't have money to get more.: Sometimes true   Transportation Needs: No Transportation Needs (11/04/2023)    PRAPARE - Therapist, art (Medical): No     Lack of Transportation (Non-Medical): No   Recent Concern: Transportation Needs - Unmet Transportation Needs (08/29/2023)    Received from [facility]    [facility] - Transportation     In the past 12 months, has lack of transportation kept you from medical appointments or from getting medications?: Yes     In the past 12 months, has lack of transportation kept you from meetings, work, or from getting things needed for daily living?: Yes   Physical Activity: Inactive (04/22/2021)    Received from [facility]    Exercise Vital Sign     On average, how many days per week do you engage in moderate to strenuous exercise (like a brisk walk)?: 0 days     On average, how many minutes do you engage in exercise at this level?: 0 min   Stress: No Stress Concern Present (01/15/2022)    Received from [facility]    [facility] of Occupational Health - Occupational Stress Questionnaire     Feeling of Stress : Only a little   Social Connections: Socially Isolated (01/15/2022)    [facility]    Social Connection and Isolation Panel     In a typical week, how many times do you talk on the phone with family, friends, or  neighbors?: More than three times a week     How often do you get together with friends or relatives?: Never     How often do you attend church or religious services?: Never     Do you belong to any clubs or organizations such as church groups, unions, fraternal or athletic groups, or school groups?: No     How often do you attend meetings of the clubs or organizations you belong to?: Never     Are you married, widowed, divorced, separated, never married, or living with a partner?: Separated   Intimate Partner Violence: Not At Risk (11/04/2023)    Humiliation, Afraid, Rape, and Kick questionnaire     Fear of Current or Ex-Partner: No     Emotionally Abused: No     Physically Abused: No     Sexually Abused: No   Housing Stability: Not At Risk (11/04/2023)    Housing Stability NCSS     Do you have housing?: Yes     Are you worried about losing your  housing?: No   [5]   Allergies  Allergen Reactions    Strawberry C [Ascorbate] Hives    Strawberry Extract Hives   [6]   Current Facility-Administered Medications   Medication Dose Route Frequency    aspirin  EC  81 mg Oral Daily    atorvastatin   80 mg Oral Daily    gabapentin   600 mg Oral TID    heparin  (porcine)  5,000 Units Subcutaneous Q8H [facility]    insulin  lispro  1-5 Units Subcutaneous TID AC    metoprolol  tartrate  25 mg Oral Q12H SCH    piperacillin-tazobactam  4.5 g Intravenous Q8H    tamsulosin   0.4 mg Oral Daily after dinner    traZODone   50 mg Oral QHS

## 2023-11-06 NOTE — Plan of Care (Signed)
 Foot & Ankle Surgery Post-Op Plan of Care Note  Spectra  [phone] / Pager [phone]    Assessment:   S/p bedside right partial hallux amputation, closed     Plan:   - No further surgical plans at this time.  - Dressing to remain clean, dry, and intact.    - Heel Weight Bearing to the right lower extremity in a postop shoe  - Reinforce dressing as needed with abd, kerlix, ace  - ABX - per ID  - F/u xrays  - F/u intraoperative Wcx and path  - Diet: per primary team  - DVT PPx: Per primary Team -heparin  subcutaneous  - PT/OT ordered  - Rest of medical management per primary team  - Will continue to follow while in house  - Plan discussed with Marinus Longest, MD        Scott Monroe, DPM  Resident - Foot and Ankle Surgery   [facility]   [phone] (Podiatry On Call Pager)

## 2023-11-06 NOTE — Plan of Care (Signed)
 Problem: Pain interferes with ability to perform ADL  Goal: Pain at adequate level as identified by patient  Outcome: Progressing  Flowsheets (Taken 11/06/2023 0912)  Pain at adequate level as identified by patient:   Identify patient comfort function goal   Assess for risk of opioid induced respiratory depression, including snoring/sleep apnea. Alert healthcare team of risk factors identified.     Problem: Side Effects from Pain Analgesia  Goal: Patient will experience minimal side effects of analgesic therapy  Outcome: Progressing  Flowsheets (Taken 11/06/2023 0912)  Patient will experience minimal side effects of analgesic therapy:   Monitor/assess patient's respiratory status (RR depth, effort, breath sounds)   Prevent/manage side effects per LIP orders (i.e. nausea, vomiting, pruritus, constipation, urinary retention, etc.)     Problem: Safety  Goal: Patient will be free from injury during hospitalization  Outcome: Progressing  Flowsheets (Taken 11/06/2023 0912)  Patient will be free from injury during hospitalization:   Include patient/ family/ care giver in decisions related to safety   Ensure appropriate safety devices are available at the bedside   Use appropriate transfer methods   Assess patient's risk for falls and implement fall prevention plan of care per policy     Problem: Day of Admission - Heart Failure  Goal: Heart Failure Admission  Outcome: Progressing  Flowsheets (Taken 11/06/2023 0912)  Heart Failure Admission:   Initiate education with patient and caregiver using CHF Warning Zones and Educational Videos ([employer] or [employer])   Oxygen as needed

## 2023-11-06 NOTE — Progress Notes (Signed)
 [facility]              Phone: [phone]   Fax: [phone]      [facility] Appointment Information:   Reason   Received a referral to schedule a follow up appointment with the [facility].      Ordering User Kingston Ronal PARAS, MD    Appointment Details Appointment scheduled for 11/18/23 at 1:40pm with Ezella Lax, FNP at [facility] location.        []  [facility]  [address]. [place], [address]   Phone: [phone]    [x]  [facility]   [address] [place] [address]   Phone: [phone]    []  [facility]   [address] [place] [place] [address]   Phone(s): [phone] or [phone]     Appointment Instructions    Please notify patient to arrive 15 minutes early to the appointment and bring the following materials, as applicable:     - Photo ID   - Insurance card (if insured)   - Medications in their original bottles    - If Diabetic: Glucometer/blood sugar log     - If Heart Failure: Weight log     - If Uninsured: Proof of income (to enroll in medication assistance programs-first two pages of signed 1040 tax forms or last 2 months of pay stubs)         [facility]  [address]   [place], [place]. [address]  P: [phone]  F: [phone]

## 2023-11-06 NOTE — Nursing Progress Note (Addendum)
 Brief background: Scott Monroe. is a 78 y.o. male with diabetes, hypertension, hyperlipidemia, chronic HFpEF (grade 1) A-fib (on Eliquis ) with recent ED visit 11/02/2023 for right hallux osteomyelitis with overlying abscess who presented to the emergency room and was noted to have gram-negative bacteremia (now growing Klebsiella and E. coli).  Recent wound culture growing Pseudomonas and staph ludgenesis.     Last 24 Hrs: Pt is A&Ox4. Surgery done today at bedside. Pt BP high, MD aware. No complaints of pain.     Abnormal labs/VS:        Recent Labs   Lab 11/06/23  0327   Sodium 136   Potassium 5.0   Chloride 110   CO2 21   BUN 35*   Creatinine 1.8*   GFR 38.8*   Glucose 163*   Calcium  8.2       Recent Labs   Lab 11/06/23  0327   WBC 7.80   Hemoglobin 9.8*   Hematocrit 30.2*   Platelet Count 203          Neuro changes: A&Ox4     Respiratory: RA     Tele/ Rhythm: On Tele     GI/GU:  Foley LAST BM 11/05/23     Nutrition: Consistent Carb and Heart Healthy      Activity/ Safety/ Mobility: Independent/Cane      Skin/ Wounds: See assessment      LDA   Patient Lines/Drains/Airways Status         Active Lines, Drains and Airways         Name Placement date Placement time Site Days     Peripheral IV 11/05/23 20 G Standard Anterior;Proximal;Right Forearm 11/05/23  1430  Forearm  less than 1     Urethral Catheter Non-latex;Straight-tip 16 Fr. 11/02/23  1112  Non-latex;Straight-tip  3                          VTE prophylaxis:             Current Facility-Administered Medications (Includes Only Anticoagulants, Misc. Hematological)   Medication Dose Route Last Admin    heparin  (porcine) injection 5,000 Units  5,000 Units Subcutaneous 5,000 Units at 11/05/23 1434

## 2023-11-06 NOTE — Plan of Care (Signed)
 Problem: Pain interferes with ability to perform ADL  Goal: Pain at adequate level as identified by patient  Outcome: Progressing  Flowsheets (Taken 11/05/2023 2138)  Pain at adequate level as identified by patient:   Identify patient comfort function goal   Assess for risk of opioid induced respiratory depression, including snoring/sleep apnea. Alert healthcare team of risk factors identified.   Assess pain on admission, during daily assessment and/or before any as needed intervention(s)   Reassess pain within 30-60 minutes of any procedure/intervention, per Pain Assessment, Intervention, Reassessment (AIR) Cycle   Evaluate if patient comfort function goal is met   Evaluate patient's satisfaction with pain management progress   Offer non-pharmacological pain management interventions     Problem: Side Effects from Pain Analgesia  Goal: Patient will experience minimal side effects of analgesic therapy  Outcome: Progressing  Flowsheets (Taken 11/05/2023 2138)  Patient will experience minimal side effects of analgesic therapy:   Monitor/assess patient's respiratory status (RR depth, effort, breath sounds)   Assess for changes in cognitive function   Prevent/manage side effects per LIP orders (i.e. nausea, vomiting, pruritus, constipation, urinary retention, etc.)   Evaluate for opioid-induced sedation with appropriate assessment tool (i.e. POSS)     Problem: Moderate/High Fall Risk Score >5  Goal: Patient will remain free of falls  Outcome: Progressing  Flowsheets (Taken 11/06/2023 0418)  Moderate Risk (6-13):   MOD-Consider activation of bed alarm if appropriate   MOD-Remain with patient during toileting   MOD-Perform dangle, stand, walk (DSW) prior to mobilization   MOD-Apply bed exit alarm if patient is confused   MOD-Re-orient confused patients   MOD-Consider a move closer to Nurses Station   MOD-Place bedside commode and assistive devices out of sight when not in use   MOD-Floor mat at bedside (where  available) if appropriate   MOD-Use gait belt when appropriate   MOD-Utilize diversion activities     Problem: Compromised Sensory Perception  Goal: Sensory Perception Interventions  Outcome: Progressing  Flowsheets (Taken 11/06/2023 0418)  Sensory Perception Interventions: Offload heels, Pad bony prominences, Reposition q 2hrs/turn Clock, Q2 hour skin assessment under devices if present     Problem: Compromised Activity/Mobility  Goal: Activity/Mobility Interventions  Outcome: Progressing  Flowsheets (Taken 11/06/2023 0418)  Activity/Mobility Interventions: Pad bony prominences, TAP Seated positioning system when OOB, Promote PMP, Reposition q 2 hrs / turn clock, Offload heels     Problem: Safety  Goal: Patient will be free from injury during hospitalization  Outcome: Progressing  Flowsheets (Taken 11/06/2023 0418)  Patient will be free from injury during hospitalization:   Assess patient's risk for falls and implement fall prevention plan of care per policy   Include patient/ family/ care giver in decisions related to safety   Assess for patients risk for elopement and implement Elopement Risk Plan per policy   Use appropriate transfer methods   Ensure appropriate safety devices are available at the bedside   Provide and maintain safe environment   Hourly rounding   Provide alternative method of communication if needed (communication boards, writing)  Goal: Patient will be free from infection during hospitalization  Outcome: Progressing  Flowsheets (Taken 11/06/2023 0418)  Free from Infection during hospitalization:   Assess and monitor for signs and symptoms of infection   Monitor all insertion sites (i.e. indwelling lines, tubes, urinary catheters, and drains)   Encourage patient and family to use good hand hygiene technique   Monitor lab/diagnostic results

## 2023-11-07 LAB — LAB USE ONLY - CBC WITH DIFFERENTIAL
Absolute Basophils: 0.08 x10 3/uL (ref 0.00–0.08)
Absolute Eosinophils: 0.28 x10 3/uL (ref 0.00–0.44)
Absolute Immature Granulocytes: 0.05 x10 3/uL (ref 0.00–0.07)
Absolute Lymphocytes: 1.97 x10 3/uL (ref 0.42–3.22)
Absolute Monocytes: 0.64 x10 3/uL (ref 0.21–0.85)
Absolute Neutrophils: 5.06 x10 3/uL (ref 1.10–6.33)
Absolute nRBC: 0 x10 3/uL (ref <=0.00)
Basophils %: 1 %
Eosinophils %: 3.5 %
Hematocrit: 29.8 % — ABNORMAL LOW (ref 37.6–49.6)
Hemoglobin: 9.8 g/dL — ABNORMAL LOW (ref 12.5–17.1)
Immature Granulocytes %: 0.6 %
Lymphocytes %: 24.4 %
MCH: 27.9 pg (ref 25.1–33.5)
MCHC: 32.9 g/dL (ref 31.5–35.8)
MCV: 84.9 fL (ref 78.0–96.0)
MPV: 9.8 fL (ref 8.9–12.5)
Monocytes %: 7.9 %
Neutrophils %: 62.6 %
Platelet Count: 200 x10 3/uL (ref 142–346)
Preliminary Absolute Neutrophil Count: 5.06 x10 3/uL (ref 1.10–6.33)
RBC: 3.51 x10 6/uL — ABNORMAL LOW (ref 4.20–5.90)
RDW: 15 % (ref 11–15)
WBC: 8.08 x10 3/uL (ref 3.10–9.50)
nRBC %: 0 /100{WBCs} (ref <=0.0)

## 2023-11-07 LAB — WHOLE BLOOD GLUCOSE POCT
Whole Blood Glucose POCT: 150 mg/dL — ABNORMAL HIGH (ref 70–100)
Whole Blood Glucose POCT: 244 mg/dL — ABNORMAL HIGH (ref 70–100)
Whole Blood Glucose POCT: 145 mg/dL — ABNORMAL HIGH (ref 70–100)

## 2023-11-07 LAB — BASIC METABOLIC PANEL
Anion Gap: 6 (ref 5.0–15.0)
BUN: 27 mg/dL (ref 9–28)
CO2: 21 meq/L (ref 17–29)
Calcium: 8.4 mg/dL (ref 7.9–10.2)
Chloride: 109 meq/L (ref 99–111)
Creatinine: 1.5 mg/dL (ref 0.5–1.5)
GFR: 48.2 mL/min/1.73 m2 — ABNORMAL LOW (ref >=60.0)
Glucose: 199 mg/dL — ABNORMAL HIGH (ref 70–100)
Potassium: 4.9 meq/L (ref 3.5–5.3)
Sodium: 136 meq/L (ref 135–145)

## 2023-11-07 LAB — LAB USE ONLY - FUNGAL STAIN: Fungal Stain: NONE SEEN

## 2023-11-07 LAB — LAB USE ONLY - ACID FAST BACILLI STAIN: Acid Fast Bacilli Stain: NONE SEEN

## 2023-11-07 LAB — CULTURE BLOOD AEROBIC AND ANAEROBIC: Culture Blood: NO GROWTH

## 2023-11-07 MED ORDER — ACETAMINOPHEN 500 MG PO TABS
1000.0000 mg | ORAL_TABLET | Freq: Three times a day (TID) | ORAL | Status: DC
Start: 2023-11-07 — End: 2023-11-12
  Administered 2023-11-07 – 2023-11-12 (×14): 1000 mg via ORAL
  Filled 2023-11-07 (×14): qty 2

## 2023-11-07 MED ORDER — AMLODIPINE BESYLATE 5 MG PO TABS
10.0000 mg | ORAL_TABLET | Freq: Every day | ORAL | Status: DC
Start: 2023-11-07 — End: 2023-11-14
  Administered 2023-11-07 – 2023-11-14 (×8): 10 mg via ORAL
  Filled 2023-11-07 (×9): qty 2

## 2023-11-07 MED ORDER — OXYCODONE HCL 5 MG PO TABS
5.0000 mg | ORAL_TABLET | Freq: Four times a day (QID) | ORAL | Status: AC | PRN
Start: 2023-11-07 — End: ?
  Administered 2023-11-08 – 2023-11-11 (×2): 5 mg via ORAL
  Filled 2023-11-07 (×2): qty 1

## 2023-11-07 MED ORDER — OXYCODONE HCL 10 MG PO TABS
10.0000 mg | ORAL_TABLET | Freq: Four times a day (QID) | ORAL | Status: DC | PRN
Start: 2023-11-07 — End: 2023-11-14
  Administered 2023-11-07 – 2023-11-14 (×10): 10 mg via ORAL
  Filled 2023-11-07: qty 1
  Filled 2023-11-07: qty 2
  Filled 2023-11-07 (×2): qty 1
  Filled 2023-11-07: qty 2
  Filled 2023-11-07: qty 1
  Filled 2023-11-07 (×2): qty 2
  Filled 2023-11-07 (×2): qty 1

## 2023-11-07 NOTE — Nursing Progress Note (Addendum)
 Brief background:   78 y.o. male with PMH T2DM (A1c 7.78 2 months ago), HTN, HLD, Afib on Eliquis , smoker, who presents for R hallux OM. Admitted by medicine for IV antibiotics and further workup of bacteremia 2/2 UTI versus right hallux OM on 11/04/2023.      Last 24 Hrs:   Patient reported well controlled pain on right foot.   Complaint of a new pimple-like lesion on the right temple.  No acute events overnight.     Abnormal labs/VS:     Neuro changes: A&O x 4     Respiratory: Room air     Tele/Rhythm:  None     GI/GU: Foley cathter Fr 16, BM: 11/06/2023     Nutrition: Consistent carb, heart healthy     Weight trend: None     Activity/ Safety/mobility: Independent (right foot heel weight bearing)     LDA access, line removal:   PIV 20 G Left Forearm     Skin/Wounds: Right partial hallux amputation     VTE Prophylaxis: Heparin  (porcine) injection 5,000 units     Upcoming procedures: RLE angiogram 10/25     Discharge date/plans: TBD

## 2023-11-07 NOTE — Progress Notes (Signed)
 Office: [phone]  Epic GroupChat: [facility] ([facility])    Date Time: 11/07/23 @NOW   Patient Name: Scott Monroe, Scott Monroe.      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  Approximately 3 cc of purulent drainage expressed, drainage was cultured.   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 Patient reports pain not well controlled in foot. No other issues to discuss.    Antimicrobials:   #3 Piperacillin-tazobactam    D/c  #2 vanc IV  Estimated Creatinine Clearance: 34.2 mL/min (based on SCr of 1.5 mg/dL).      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 Path and cultures pending. Original wound cultures significant for S.lugdunensis and PsA. Piperacillin-tazobactam should likely cover both. Duration of antibiotics will depend on pathology results. He will likely require at least 2 weeks for overlying ABSSSI. Patient will likely be able to go on combination of levofloxacin and cephalexin to cover S.lugdunensis and Pseudomonas aeruginosa.    Plan:   - CONTINUE Piperacillin-tazobactam for now while in-patient  - when otherwise ready for discharge, patient may be able to go on cephalexin and levofloxacin to complete his course of therapy  - follow-up OR cultures, pathology  - follow-up blood  cultures    Monitoring clinical response and untoward effects of high risk/IV antimicrobials    - Reviewed labs ordered by the primary team, including most recent blood counts, chemistry, as well as labs recommended by ID/myself  - Reviewed notes by the primary team and other consultants if available  - Recommendations including additional work-up/testing to be ordered as above  - Discussed my assessment and recommendations with primary team    Lines:     Patient Lines/Drains/Airways Status       Active PICC Line / CVC Line / PIV Line / Drain / Airway / Intraosseous Line / Epidural Line / ART Line / Line / Wound / Pressure Ulcer / NG/OG Tube       Name Placement date Placement time Site Days    Peripheral IV 11/06/23 Left Forearm 11/06/23  1709  Forearm  less than 1    Urethral Catheter Non-latex;Straight-tip 16 Fr. 11/02/23  1112  Non-latex;Straight-tip  4                    *I have performed a risk-benefit analysis and the patient needs a central line for access and IV medications      Review of Systems:   Detailed 10 system review was done; pertinent positives and negatives listed in HPI, otherwise negative in details.      Physical Exam:  Vitals:    11/07/23 0918   BP: (!) 174/94   Pulse: 94   Resp:    Temp:    SpO2:        General Appearance: alert and appropriate, non-toxic  Neuro: alert, oriented, normal speech, normal attention and cognition  HEENT: no scleral icterus, pupils round and reactive, OP clear  Neck: supple  Lungs: clear to auscultation, no wheezes, rales or rhonchi, symmetric air entry   Cardiac: normal rate, regular rhythm, normal S1, S2,   Abdomen: soft, non-tender, non-distended, normal active bowel sounds  Extremities: left foot in primary surgical dressing  Skin: no rash  Psych: normal mood and affect      Family History:   Family History[1]    Social History:   Social History[2]    Allergies:   Allergies[3]    Labs:   Available labs reviewed from EMR, independently  interpreted    Microbiology:   All data reviewed and pertinent info summarized above, independently interpeted  Rads:   XR Foot Right AP Lateral And Oblique  Result Date: 11/06/2023  Partial amputation of the right hallux at the proximal phalangeal neck. [name], MD 11/06/2023 1:29 PM    Signed by: Marsa JINNY Hefty, MD         [1] No family history on file.  [2]   Social History  Socioeconomic History    Marital status: Divorced   Tobacco Use    Smoking status: Every Day     Current packs/day: 1.00     Types: Cigarettes    Smokeless tobacco: Never   Vaping Use    Vaping status: Never Used   Substance and Sexual Activity    Alcohol use: Never    Drug use: Never     Social Drivers of Psychologist, prison and probation services Strain: High Risk (11/04/2023)    Overall Financial Resource Strain (CARDIA)     Difficulty of Paying Living Expenses: Hard   Food Insecurity: No Food Insecurity (11/04/2023)    Hunger Vital Sign     Worried About Running Out of Food in the Last Year: Never true     Ran Out of Food in the Last Year: Never true   Recent Concern: Food Insecurity - Food Insecurity Present (08/29/2023)    Received from [facility]    Hunger Vital Sign     Within the past 12 months, you worried that your food would run out before you got the money to buy more.: Sometimes true     Within the past 12 months, the food you bought just didn't last and you didn't have money to get more.: Sometimes true   Transportation Needs: No Transportation Needs (11/04/2023)    PRAPARE - Therapist, art (Medical): No     Lack of Transportation (Non-Medical): No   Recent Concern: Transportation Needs - Unmet Transportation Needs (08/29/2023)    Received from [facility]    [facility] - Transportation     In the past 12 months, has lack of transportation kept you from medical appointments or from getting medications?: Yes     In the past 12 months, has lack of transportation  kept you from meetings, work, or from getting things needed for daily living?: Yes   Physical Activity: Inactive (04/22/2021)    Received from [facility]    Exercise Vital Sign     On average,  how many days per week do you engage in moderate to strenuous exercise (like a brisk walk)?: 0 days     On average, how many minutes do you engage in exercise at this level?: 0 min   Stress: No Stress Concern Present (01/15/2022)    Received from [facility]    [facility] of Occupational Health - Occupational Stress Questionnaire     Feeling of Stress : Only a little   Social Connections: Socially Isolated (01/15/2022)    Received from [facility]    Social Connection and Isolation Panel     In a typical week, how many times do you talk on the phone with family, friends, or neighbors?: More than three times a week     How often do you get together with friends or relatives?: Never     How often do you attend church or religious services?: Never     Do you belong to any clubs or organizations such as church groups, unions, fraternal or athletic groups, or school groups?: No     How often do you attend meetings of the clubs or organizations you belong to?: Never     Are you married, widowed, divorced, separated, never married, or living with a partner?: Separated   Intimate Partner Violence: Not At Risk (11/04/2023)    Humiliation, Afraid, Rape, and Kick questionnaire     Fear of Current or Ex-Partner: No     Emotionally Abused: No     Physically Abused: No     Sexually Abused: No   Housing Stability: Not At Risk (11/04/2023)    Housing Stability NCSS     Do you have housing?: Yes     Are you worried about losing your housing?: No   [3]   Allergies  Allergen Reactions    Strawberry C [Ascorbate] Hives    Strawberry Extract Hives

## 2023-11-07 NOTE — Plan of Care (Addendum)
 Pt a/ox4, on RA, no Tele. BP med adjusted by MD this afternoon. Dressing  to R. Foot CDI. Pt instructed to wear post -op shoes when OOB and educated on heel weightbearing.   Prn pain meds given with some relief. All safety precautions in place. Care ongoing.   Problem: Pain interferes with ability to perform ADL  Goal: Pain at adequate level as identified by patient  Flowsheets (Taken 11/07/2023 0223 by Karry Sherrilyn Jenkins KATHEE, RN)  Pain at adequate level as identified by patient:   Identify patient comfort function goal   Assess for risk of opioid induced respiratory depression, including snoring/sleep apnea. Alert healthcare team of risk factors identified.   Assess pain on admission, during daily assessment and/or before any as needed intervention(s)   Reassess pain within 30-60 minutes of any procedure/intervention, per Pain Assessment, Intervention, Reassessment (AIR) Cycle   Evaluate if patient comfort function goal is met   Evaluate patient's satisfaction with pain management progress   Offer non-pharmacological pain management interventions   Include patient/patient care companion in decisions related to pain management as needed     Problem: Side Effects from Pain Analgesia  Goal: Patient will experience minimal side effects of analgesic therapy  Flowsheets (Taken 11/06/2023 0912 by Gailen Combe, RN)  Patient will experience minimal side effects of analgesic therapy:   Monitor/assess patient's respiratory status (RR depth, effort, breath sounds)   Prevent/manage side effects per LIP orders (i.e. nausea, vomiting, pruritus, constipation, urinary retention, etc.)     Problem: Moderate/High Fall Risk Score >5  Goal: Patient will remain free of falls  Flowsheets  Taken 11/06/2023 2200 by Karry Sherrilyn Jenkins KATHEE, RN  Moderate Risk (6-13):   MOD-Consider activation of bed alarm if appropriate   MOD-Floor mat at bedside (where available) if appropriate   MOD-Remain with patient during toileting  Taken 11/04/2023  2109 by Dan Hadassah Falling, RN  High (Greater than 13):   MOD-Use of chair-pad alarm when appropriate   MOD-Remain with patient during toileting   MOD-Perform dangle, stand, walk (DSW) prior to mobilization     Problem: Compromised Sensory Perception  Goal: Sensory Perception Interventions  Flowsheets (Taken 11/07/2023 0223 by Karry Sherrilyn Jenkins KATHEE, RN)  Sensory Perception Interventions: Offload heels, Pad bony prominences, Reposition q 2hrs/turn Clock, Q2 hour skin assessment under devices if present     Problem: Compromised Activity/Mobility  Goal: Activity/Mobility Interventions  Flowsheets (Taken 11/06/2023 0900 by Gailen Combe, RN)  Activity/Mobility Interventions: Pad bony prominences, TAP Seated positioning system when OOB, Promote PMP, Reposition q 2 hrs / turn clock, Offload heels     Problem: Compromised Moisture  Goal: Moisture level Interventions  Flowsheets (Taken 11/07/2023 0223 by Karry Sherrilyn Jenkins KATHEE, RN)  Moisture level Interventions: Moisture wicking products, Moisture barrier cream     Problem: Safety  Goal: Patient will be free from injury during hospitalization  Flowsheets (Taken 11/07/2023 0223 by Karry Sherrilyn Jenkins KATHEE, RN)  Patient will be free from injury during hospitalization:   Assess patient's risk for falls and implement fall prevention plan of care per policy   Provide and maintain safe environment   Ensure appropriate safety devices are available at the bedside   Include patient/ family/ care giver in decisions related to safety   Hourly rounding  Goal: Patient will be free from infection during hospitalization  Flowsheets (Taken 11/06/2023 0418 by Rudy Colbert BRAVO, RN)  Free from Infection during hospitalization:   Assess and monitor for signs and symptoms of infection  Monitor all insertion sites (i.e. indwelling lines, tubes, urinary catheters, and drains)   Encourage patient and family to use good hand hygiene technique   Monitor lab/diagnostic results     Problem:  Pain  Goal: Pain at adequate level as identified by patient  Flowsheets (Taken 11/07/2023 0223 by Karry Sherrilyn Jenkins KATHEE, RN)  Pain at adequate level as identified by patient:   Identify patient comfort function goal   Assess for risk of opioid induced respiratory depression, including snoring/sleep apnea. Alert healthcare team of risk factors identified.   Assess pain on admission, during daily assessment and/or before any as needed intervention(s)   Reassess pain within 30-60 minutes of any procedure/intervention, per Pain Assessment, Intervention, Reassessment (AIR) Cycle   Evaluate if patient comfort function goal is met   Evaluate patient's satisfaction with pain management progress   Offer non-pharmacological pain management interventions   Include patient/patient care companion in decisions related to pain management as needed     Problem: Day of Admission - Heart Failure  Goal: Heart Failure Admission  Flowsheets (Taken 11/06/2023 0912 by Gailen Combe, RN)  Heart Failure Admission:   Initiate education with patient and caregiver using CHF Warning Zones and Educational Videos ([employer] or [employer])   Oxygen as needed     Problem: Everyday - Heart Failure  Goal: Stable Vital Signs and Fluid Balance  Flowsheets (Taken 11/07/2023 0223 by Karry Sherrilyn Jenkins KATHEE, RN)  Stable Vital Signs and Fluid Balance:   Daily Standing Weights in the morning using the same scale, after using the bathroom and before breadfast.  If unable to stand, zero the bed and use the bed scale   Monitor, assess vital signs and telemetry per policy   Monitor labs and report abnormalities to physician   Strict Intake/Output   Fluid Restriction   Assess for swelling/edema  Goal: Mobility/Activity is Maintained at Optimal Level for Patient  Flowsheets (Taken 11/06/2023 0601 by Rudy Colbert BRAVO, RN)  Mobility/Activity is Maintained at Optimal Level for Patient:   Increase mobility as tolerated/progressive mobility protocol   Perform  active/passive ROM   Assess for changes in respiratory status, level of consciousness and/or development of fatigue   Reposition patient every 2 hours and as needed unless able to reposition self   Maintain SCD's as Ordered   Consult with Physical Therapy and/or Occupational Therapy  Goal: Nutritional Intake is Adequate  Flowsheets (Taken 11/06/2023 0601 by Rudy Colbert BRAVO, RN)  Nutritional Intake is Adequate:   Cardiac diet-2 gm Sodium   Fluid Restricction if needed   Encourage/perform oral hygiene as appropriate   Patient and family teaching on low sodium diet   Consult/Collaborate with Nutritionist   Assess appetite,anorexia and amount of meal/food tolerated  Goal: Teaching-Using CHF Warning Zones and Educational Videos  Flowsheets (Taken 11/06/2023 0601 by Rudy Colbert BRAVO, RN)  Teaching-Using CHF Warning Zones and Educational Videos:   Signs & Symptoms of CHF   CHF Warning Zones and when to call for help   Daily Standing Weights & record   Medications   Document in the Education Tab in EPIC with Teach-back   Fluid Restriction if appropriate     Problem: Day of Discharge - Heart Failure  Goal: Discharge Education  Flowsheets (Taken 11/06/2023 0601 by Rudy Colbert BRAVO, RN)  Day of Discharge:   After Visit Summary (Discharge Instuctions) with medications   Follow-up appointments   Low Sodium Diet   Fluid Restriction   CHF Warning Zones and when to  call for help   Daily Standing Weights     Problem: Hemodynamic Status: Cardiac  Goal: Stable vital signs and fluid balance  Flowsheets (Taken 11/07/2023 0223 by Karry Sherrilyn Jenkins KATHEE, RN)  Stable vital signs and fluid balance:   Assess signs and symptoms associated with cardiac rhythm changes   Monitor lab values     Problem: Fluid and Electrolyte Imbalance/ Endocrine  Goal: Fluid and electrolyte balance are achieved/maintained  Flowsheets (Taken 11/07/2023 0223 by Karry Sherrilyn Jenkins KATHEE, RN)  Fluid and electrolyte balance are achieved/maintained:    Monitor/assess lab values and report abnormal values   Assess and reassess fluid and electrolyte status   Monitor for muscle weakness     Problem: Diabetes: Glucose Imbalance  Goal: Blood glucose stable at established goal  Flowsheets (Taken 11/07/2023 0223 by Karry Sherrilyn Jenkins KATHEE, RN)  Blood glucose stable at established goal:   Assess for hypoglycemia /hyperglycemia   Monitor/assess vital signs   Coordinate medication administration with meals, as indicated   Ensure appropriate diet and assess tolerance   Ensure adequate hydration   Ensure appropriate consults are obtained (Nutrition, Diabetes Education, and Case Management)     Problem: Every Day  Goal: Infection prevention (risk for infection)  Flowsheets (Taken 11/07/2023 0223 by Karry Sherrilyn Jenkins KATHEE, RN)  Infection prevention (risk for infection) Interventions:   Assess for signs/symptoms of infection.   Patient & family hand hygiene.

## 2023-11-07 NOTE — Progress Notes (Addendum)
 [facility]  Internal Medicine Hospitalists  Progress Note        Assessment / Plan:        Scott Monroe. is a 78 y.o. male with diabetes, hypertension, hyperlipidemia, chronic HFpEF (grade 1) A-fib (on Eliquis ) with recent ED visit 11/02/2023 for right hallux osteomyelitis with overlying abscess who presented to the emergency room and was noted to have gram-negative bacteremia and UTI with + Ucx(now growing Klebsiella and E. coli).  Recent wound culture growing Pseudomonas and staph ludgenesis.    #Gram-negative bacteremia-  - Followed by ID most likely source CAUTI versus osteomyelitis   - F/u BCx 10/18. 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  #Bladder mass  #Urinary retention  #CAUTI - Kelbsiella/ecoli   - Continue with Zosyn per ID  - Continue with Foley catheter  - Urology recommending outpatient cystoscopy (has evaluated previously)    #Hyperlipidemia  - Continue with atorvastatin     #Insomnia  - Trazodone     #Acute on chronic renal failure  - Has not been following with specialists (is going to need a transitional care appointment prior to leaving)  - Says he has been told he has issues with renal failure in the past  - Looks like his baseline is around 1.3, already improved  - Continue to monitor  - Hold valsartan   - Avoid nephrotoxins     #Diabetes  #Neuropathy  - Continue gabapentin   - Holding metformin   - Sliding scale insulin     #Chronic HFpEF and grade 1 DD  # Hypertension  # CAD  # A-fib  - Continue with atorvastatin   - Metoprolol  25 twice daily  - Holding valsartan  in the setting of renal failure  - Continue with aspirin   - Holding Eliquis  for possible surgical intervention,  CHA2DS2-VASc is 4 no need to bridge, start DVT prophylaxis  - 10/23 add amlodipine     VTE Prophylaxis: apixaban  (ELIQUIS ) tablet    heparin  (porcine) injection 5,000 Units       Foley Catheter: Foley for an urinary retention    Venous Access: No Temporary Central Line Present    Medical Readiness for Discharge: Anticipated in 5+ Days    Open Handoff Activity in Sidebar    Current Facility-Administered Medications[1]  Infusion Meds[2]  PRN Medications[3]  Allergies[4]    Subjective:         Reports 10/10 R foot pain, improved to 8/10 with PO dilaudid   No other acute symptoms          Objective:      Temp:  [97.7 F (36.5 C)-98.8 F (37.1 C)] 98.6 F (37 C)  Heart Rate:  [71-100] 100  Resp Rate:  [16-18] 16  BP: (140-188)/(80-96) 154/88     General: WD male in no acute distress.  HEENT: eomi, sclera anicteric, mucous membranes moist  Neck: no JVD noted  Cardiovascular: regular rhythm, normal rate  Lungs: normal WOB  Abdomen: Soft, NTND  Extremities: No LE edema  Neuro: AAOx3, no gross focal deficits                            [  1]   Current Facility-Administered Medications   Medication Dose Route Frequency    acetaminophen   1,000 mg Oral TID    aspirin  EC  81 mg Oral Daily    atorvastatin   80 mg Oral Daily    gabapentin   600 mg Oral TID    heparin  (porcine)  5,000 Units Subcutaneous Q8H SCH    insulin  lispro  1-5 Units Subcutaneous TID AC    metoprolol  tartrate  25 mg Oral Q12H SCH    piperacillin-tazobactam  4.5 g Intravenous Q8H    tamsulosin   0.4 mg Oral Daily after dinner    traZODone   50 mg Oral QHS   [2]   Current Facility-Administered Medications   Medication Dose Route Frequency Last Rate   [3]   Current Facility-Administered Medications   Medication Dose Route    benzocaine-menthol  1 lozenge Buccal    benzonatate  100 mg Oral    carboxymethylcellulose sodium  1 drop Both Eyes    dextrose   15 g of glucose Oral    Or    dextrose   12.5 g Intravenous    Or    dextrose   12.5 g Intravenous    Or    glucagon   (rDNA)  1 mg Intramuscular    dextrose   15 g of glucose Oral    Or    dextrose   12.5 g Intravenous    Or    dextrose   12.5 g Intravenous    Or    glucagon  (rDNA)  1 mg Intramuscular    magnesium  sulfate  1 g Intravenous    melatonin  3 mg Oral    naloxone   0.2 mg Intravenous    oxyCODONE   10 mg Oral    oxyCODONE   5 mg Oral    potassium & sodium phosphates   2 packet Oral    potassium chloride   0-60 mEq Oral    Or    potassium chloride   0-60 mEq Oral    Or    potassium chloride   10 mEq Intravenous    saline  2 spray Each Nare   [4]   Allergies  Allergen Reactions    Strawberry C [Ascorbate] Hives    Strawberry Extract Hives

## 2023-11-07 NOTE — Progress Notes (Signed)
 POD #1 today s/p R partial hallux amputation; R foot covered with dressing; clean, dry and intact; able to wiggle toes on R foot with good peripheral pulses and good capillary refill;  Pt remained stable; no signs of any distress; stable on RA; no SOB noted; no complaint of any chest discomfort; v/s stable; PRN oral Dilaudid  given for complaint of pain on surgical site; good relief noted; getting IV antibiotics as scheduled; plan for possible RLE Angio today or tomorrow; no other complaint made; will continue with pain management.

## 2023-11-07 NOTE — PT Eval Note (Signed)
 Physical Therapy Evaluation  Scott Monroe.      Post Acute Care Therapy Recommendations:     Discharge Recommendations:  Home with home health PT, Home with supervision    DME needs IF patient is discharging home: No additional equipment/DME recommended at this time    Therapy discharge recommendations may change with patient status.  Please refer to most recent note for up-to-date recommendations.    Assessment:   Significant Findings: none    Scott Monroe. is a 79 y.o. male admitted 10/20/2025with R hallux OM and UTI. Is s/p  right partial hallux amputation closed on 11-06-23.  Patient presents with compromised functional mobility secondary weakness, decrease balance and decrease endurnace. Will see for PT to obtain max functional level of mobility.     Therapy Diagnosis: impaired functional mobility    Rehabilitation Potential: good    Treatment Activities: eval, R heel wt bearing, gt, sit/stand  Educated the patient to role of physical therapy, plan of care, goals of therapy and safety with mobility and ADLs, weight bearing precautions.    Plan:   PT Frequency: 3-4x/wk    Treatment/Interventions: ex, gt, balance, transfers    Risks/benefits/POC discussed pt      Unit: [facility] ORIG BLDG 5 WEST  Bed: FO576/FO576.01     Precautions and Contraindications:   Falls, R heel wt bearing in post-op shoe,    Consult received for Scott Monroe. for PT Evaluation and Treatment.  Patient's medical condition is appropriate for Physical Therapy intervention at this time.    History of Present Illness:   Scott Monroe. is a 78 y.o. male admitted on 11/04/2023 with R hallux OM and UTI. Is s/p  right partial hallux amputation closed on 11-06-23.    Medical Diagnosis: Bacteremia [R78.81]    Past Medical/Surgical History:  CAD,HLD, HTN, A-fib, HFpEF, chronic indwelling foley: urinary retention/bladder mass, renal failure; DM , neuropathy,     Imaging/Tests/Labs:  XR Foot Right AP  Lateral And Oblique  Result Date: 11/06/2023  Partial amputation of the right hallux at the proximal phalangeal neck.      US  Renal Kidney: Impression 11/06/2023   1.No hydronephrosis. 2.Mild asymmetric atrophy of the right kidney when compared to the left. No cortical thinning or increased echogenicity to suggest further evidence of chronic renal disease. 3.Prostatomegaly.     XR Chest AP 11/06/2023  No acute cardiopulmonary process.       IMPRESSION: US  LE arterial doppler 11-02-23  1.Bilateral ABI and TBI consistent with severe peripheral arterial disease.  Severe multifocal atherosclerotic disease throughout the bilateral lower  extremity arteries.  2.Right lower extremity: Focal occlusion in the mid/distal popliteal artery  with distal reconstitution. Three-vessel runoff with weakly biphasic  waveforms.  3.Left lower extremity: Pulses parvus and tardus waveforms in the proximal  common femoral artery in setting of known common iliac artery stenosis.  Occlusion of the superficial femoral artery with reconstitution of the    Social History:   Prior Level of Function:  Prior level of function: Ambulates with assistive device, Independent with ADLs (was receiving HHOT/HHPT)  Assistive Device: Front wheel walker, Single point cane  Baseline Activity Level: Other (comment) (limited community)  Ambulated 100 feet or more prior to admission: No  Driving: does not drive  DME Currently at Home: Vannie, [employer], ADL- Paediatric nurse, [place], [place] [employer] Living Arrangements:  Living Arrangements: Children, Other (Comment) (ex-wife, daughter assists her and  him)  Type of Home: House  Home Layout: One level (0STE)  DME Currently at Home: Vannie, [employer], ADL- Paediatric nurse, [place], [place]    Subjective:   Patient is agreeable to participation in the therapy session. Nursing clears patient for therapy.     Patient Goal: home    Pain:   Scale: not rate  Location: sx site  Intervention: meds/position of  comfort    Objective:   Patient is in bed with iv, post op shoe in place.  Pt wore mask during therapy session:No      Cognitive Status and Neuro Exam:  Awake, follows commands    Musculoskeletal Examination  RUE ROM: wfl  LUE ROM: wfl  RLE ROM: wfl hip/knee  LLE ROM: wfl    RUE Strength: wfl  LUE Strength: wfl  RLE Strength: wfl hip/knee  LLE Strength: wfl    Functional Mobility  Rolling: modified independent  Supine to Sit: modified independent  Scooting: independent  Sit to Stand: sba w/ cues for R heel wt bearing  Stand to Sit: sba w/ cues for R heel wt bearing  Transfers: cga w/ rw    Ambulation  PMP - Progressive Mobility Protocol   PMP Activity: Step 7 - Walks out of Room  Distance Walked (ft) (Step 6,7):  (50 ft x 1)   Level of assistance required: cga  Pattern: unsteady, occasional cues fo rR heel wt bearing  Device Used: rw  Weightbearing Status: R heel wt bearing in post-op shoe  Stair Management: n/a    Balance  Static Sitting: good  Dynamic Sitting: good  Static Standing: good-  Dynamic Standing: fair+    Participation and Activity Tolerance  Participation Effort: good  Endurance: fair    Patient left with call bell within reach, all needs met, SCDs as found, fall mat as found, bed alarm in place, chair alarm n/a and all questions answered. RN notified of session outcome and patient response.     Goals:  Goals  Time for Goal Acheivement: 5 visits  Pt Will Perform Sit to Stand: modified independent  Pt Will Transfer Bed/Chair: with rolling walker, modified independent  Pt Will Ambulate: > 200 feet, with rolling walker, with supervision      PPE worn during session: gloves    Tech present: no  PPE worn by tech: N/A  Stacy Laine PT# [phone]    Time of Treatment  PT Received On: 11/07/23  Start Time: 1440  Stop Time: 1520  Time Calculation (min): 40 min

## 2023-11-07 NOTE — Progress Notes (Addendum)
 Foot & Ankle Surgery Progress Note  Spectra  [phone] / Pager [phone]    Date Time: 11/08/23 6:06 AM  Patient Name: Scott Monroe.  Consulting Physician: Rea Agent, [facility] Day: 5      Assessment:   Scott Monroe. is a 78 y.o. male with PMH T2DM (A1c 7.78 2 months ago), HTN, HLD, Afib on Eliquis , smoker, who presents for R hallux OM.  Admitted by medicine for IV antibiotics and further workup of bacteremia 2/2 UTI versus right hallux OM on 11/04/2023.      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     Operative Procedures:  S/p bedside I&D 11/02/2023 from previous admission  S/p 10/22 right partial hallux amputation closed    Vitals  - HR max last night 104 now 60 other VSS, AF     Labs  - Not ordered     Micro/path  -10/22 IntraOp cultures: NGTD V LG Pseudomonas aeruginosa  - 10/22 right hallux Path: Pending  - Bedside Cx 10/18: MG Pseudomonas aeruginosa, LG Staphylococcus lugdunensis, MG GPC (final)  - BCx 10/18: GNR  - Urine culture: Klebsiella pneumonia, E. coli     Imaging  - XR 10/18: cortical erosions of the hallux distal phalanx consistent with OM   - NIVS R ABI 0.34, TBI 0.14, L ABI 0.27 TBI 0.15     Plan:   -POD 2 s/p 10/22 right partial hallux amputation closed.  No further surgical plans from podiatry perspective. Stable from podiatry perspective for [place]  - Follow-up vascular RLE angiogram scheduled for 10/25  - Continue follow-up BCx and urine cultures  - ABX per ID: continue Zosyn  - Heel Weight Bearing to the right lower extremity  - DVT ppx per primary: Eliquis   - Wound Care: Xeroform, 4x4 gauze, Kerlix, and Webril.  Please keep dressings intact until Saturday 10/25, to be changed by podiatry after RLE angio  - Will discuss plan with attending, Dr. Rea Agent, DPM,  and continue to follow      Rosco Maize, DPM  Resident - Foot and Ankle Surgery   [facility]   [phone] (Podiatry On Call Pager)      I was present  during or personally performed key portions of the service documented by the resident. I have personally reviewed the patient's history and 24 hour interval events, along with vitals, labs, radiology images and additional findings found in detail within the note. I concur with the clinical information, including the patient history, physical exam, and assessment and care plan as documented in this note. Additionally, I have edited and added to the note to reflect my exam, assessment, and plan.    Jodie Ora, DPM  Podiatric Surgery   Subjective:   Interval History:     Was seen at bedside today resting comfortably in bed. No unanticipated adverse events overnight.  Denies nausea, vomiting, fever, chills.    Review of Symptoms:   As per HPI, stated above.    Physical Exam:   BP 118/68   Pulse 60   Temp 98.2 F (36.8 C) (Oral)   Resp 14   Ht 1.727 m (5' 7.99)   Wt 56.8 kg (125 lb 3.5 oz)   SpO2 98%   BMI 19.04 kg/m     Gen: NAD, alert and oriented x3  Heart: normal rate  Lung: non-labored, symmetrical chest rise, no audible wheezing  Abdomen: non-distended, soft and non-tender to palpation  Lower Extremity Exam:  Derm:  - Right: Dressing CDI  - Left: No open lesions, no interdigital maceration, no clinical signs of infection       Vasc:  - Right: pulses nonpalpable, dopplers monophasic  - Left: DP non-palpable, PT non-palpable     Neuro:  - Right: Gross sensation intact. Gross motor function intact.  - Left: Gross sensation intact. Gross motor function intact.     MSK:  - Right: compartments soft, nontender, compressible.  Pain on palpation of the right hallux  - Left: No gross deformities, compartments soft, nontender, compressible    Labs:     Recent Labs   Lab 11/07/23  0358 11/06/23  0327 11/05/23  0438 11/04/23  1756   WBC 8.08 7.80 7.73 9.18   RBC 3.51* 3.54* 3.72* 3.96*   Hemoglobin 9.8* 9.8* 10.2* 11.0*   Hematocrit 29.8* 30.2* 31.6* 33.3*   Glucose 199* 163* 153* 167*   BUN 27 35* 30* 32*    Creatinine 1.5 1.8* 1.6* 1.8*   Calcium  8.4 8.2 8.5 9.1   Sodium 136 136 139 141   Potassium 4.9 5.0 4.7 4.8   Chloride 109 110 114* 113*   CO2 21 21 20 19        Microbiology:     Recent Results (from the past 360 hours)   Culture, Urine    Collection Time: 11/02/23 12:14 PM    Specimen: Urine, Clean Catch   Result Value    Culture Urine >100,000 CFU/mL Klebsiella pneumoniae (A)    Culture Urine >100,000 CFU/mL Escherichia coli (A)       Susceptibility    Escherichia coli - MIC     Amoxicillin/Clavulanic acid <=4/2 Susceptible ug/mL     Ampicillin* <=4 Susceptible ug/mL      * If oral therapy is desired AND ampicillin is susceptible, consider amoxicillin. [place] Antimicrobial Subcommittee Nov. 2020     Ampicillin/Sulbactam 2/1 Susceptible ug/mL     Aztreonam <=2 Susceptible ug/mL     Cefazolin* <=1 Susceptible ug/mL      * Cefazolin interpretation is for uncomplicated UTI only. If oral therapy is desired for uncomplicated UTI AND E.coli is susceptible to cefazolin, consider cephalexin, cefdinir, cefpodoxime, or cefprozil. [place] Antimicrobial Subcommittee Nov. 2020     Cefepime <=1 Susceptible ug/mL     Cefoxitin <=4 Susceptible ug/mL     Ceftazidime <=2 Susceptible ug/mL     Ceftriaxone* <=1 Susceptible ug/mL      * Ceftriaxone does not predict cefdinir susceptibility. [place] Antimicrobial Subcommittee Nov. 2020     Cefuroxime 8 Intermediate ug/mL     Ciprofloxacin <=0.25 Susceptible ug/mL     Ertapenem <=0.25 Susceptible ug/mL     Gentamicin <=2 Susceptible ug/mL     Levofloxacin <=0.5 Susceptible ug/mL     Meropenem <=0.5 Susceptible ug/mL     Nitrofurantoin* <=16 Susceptible ug/mL      * Nitrofurantoin should only be used for the treatment of uncomplicated cystitis. [place] Antimicrobial Subcommittee June 2015.     Piperacillin/Tazobactam <=2/4 Susceptible ug/mL     Tetracycline* <=2 Susceptible ug/mL      * Enterobacterales susceptible to tetracycline are also considered susceptible to doxycycline and  minocycline. CLSI M100-ED33:2023     Trimethoprim/Sulfamethoxazole <=0.5/9.5 Susceptible ug/mL    Klebsiella pneumoniae - MIC     Amoxicillin/Clavulanic acid <=4/2 Susceptible ug/mL     Ampicillin* >16 Resistant ug/mL      * If oral therapy is desired AND ampicillin is susceptible, consider amoxicillin.  [place] Antimicrobial Subcommittee Nov. 2020     Ampicillin/Sulbactam 8/4 Susceptible ug/mL     Aztreonam <=2 Susceptible ug/mL     Cefazolin* <=1 Susceptible ug/mL      * Cefazolin interpretation is for uncomplicated UTI only. If oral therapy is desired for uncomplicated UTI AND K. pneumoniae is susceptible to cefazolin, consider cephalexin, cefdinir, cefpodoxime, or cefprozil. [place] Antimicrobial Subcommittee Nov. 2020     Cefepime <=1 Susceptible ug/mL     Cefoxitin <=4 Susceptible ug/mL     Ceftazidime <=2 Susceptible ug/mL     Ceftriaxone* <=1 Susceptible ug/mL      * Ceftriaxone does not predict cefdinir susceptibility. [place] Antimicrobial Subcommittee Nov. 2020     Cefuroxime <=4 Susceptible ug/mL     Ciprofloxacin <=0.25 Susceptible ug/mL     Ertapenem <=0.25 Susceptible ug/mL     Gentamicin <=2 Susceptible ug/mL     Levofloxacin <=0.5 Susceptible ug/mL     Meropenem <=0.5 Susceptible ug/mL     Nitrofurantoin* 64 Intermediate ug/mL      * Nitrofurantoin should only be used for the treatment of uncomplicated cystitis. [place] [facility] Antimicrobial Subcommittee June 2015.     Piperacillin/Tazobactam 4/4 Susceptible ug/mL     Tetracycline* <=2 Susceptible ug/mL      * Enterobacterales susceptible to tetracycline are also considered susceptible to doxycycline and minocycline. CLSI M100-ED33:2023     Trimethoprim/Sulfamethoxazole <=0.5/9.5 Susceptible ug/mL   Culture, Blood, Aerobic And Anaerobic    Collection Time: 11/02/23  1:15 PM    Specimen: Blood, Venous   Result Value    Culture Blood No growth at 5 days   Culture, Blood, Aerobic And Anaerobic    Collection Time: 11/02/23  1:31 PM    Specimen: Blood, Venous    Result Value    Culture Blood Growth of Gram negative rod (A)    Gram Stain Aerobic bottle positive for Gram negative rods (AA)   Culture And Gram Stain, Aerobic Bacteria, Wound/Tissue/Fluid    Collection Time: 11/02/23  2:21 PM    Specimen: Toe, Right; Swab   Result Value    Culture Aerobic Bacteria Moderate growth of Pseudomonas aeruginosa (A)    Culture Aerobic Bacteria Moderate growth of Pseudomonas aeruginosa (A)    Culture Aerobic Bacteria Light growth of Staphylococcus lugdunensis (A)    Gram Stain Few WBCs (A)    Gram Stain No epithelial cells (A)    Gram Stain Moderate Gram positive cocci (A)       Susceptibility    Pseudomonas aeruginosa - MIC     Aztreonam 4 Susceptible ug/mL     Cefepime 2 Susceptible ug/mL     Ceftazidime <=2 Susceptible ug/mL     Ciprofloxacin <=0.25 Susceptible ug/mL     Levofloxacin <=0.5 Susceptible ug/mL     Meropenem <=0.5 Susceptible ug/mL     Piperacillin/Tazobactam 4/4 Susceptible ug/mL     Tobramycin <=2 Susceptible ug/mL    Pseudomonas aeruginosa - MIC     Aztreonam 8 Susceptible ug/mL     Cefepime 2 Susceptible ug/mL     Ceftazidime <=2 Susceptible ug/mL     Ciprofloxacin <=0.25 Susceptible ug/mL     Levofloxacin <=0.5 Susceptible ug/mL     Meropenem <=0.5 Susceptible ug/mL     Piperacillin/Tazobactam 4/4 Susceptible ug/mL     Tobramycin <=2 Susceptible ug/mL   Fungal Stain (Component)    Collection Time: 11/02/23  2:21 PM    Specimen: Toe, Right; Swab   Result Value    Fungal Stain No fungal  or yeast elements seen   Acid Fast Bacilli Stain (Component)    Collection Time: 11/02/23  2:21 PM    Specimen: Toe, Right; Swab   Result Value    Acid Fast Bacilli Stain No acid fast bacilli seen   Culture, Blood, Aerobic And Anaerobic    Collection Time: 11/04/23 11:01 AM    Specimen: Blood, Venous   Result Value    Culture Blood No growth at 3 days   Nares, MRSA (methicillin-resistant Staphylococcus aureus) Screening, PCR    Collection Time: 11/04/23  9:40 PM    Specimen: Nares;  Swab   Result Value    MRSA (methicillin resistant Staphylococcus aureus) DNA Not Detected   Culture And Gram Stain, Aerobic Bacteria, Wound/Tissue/Fluid    Collection Time: 11/06/23 12:05 PM    Specimen: Foot, Right; Swab   Result Value    Culture Aerobic Bacteria Very light growth of Pseudomonas aeruginosa (A)    Gram Stain No squamous epithelial cells seen    Gram Stain Few WBCs    Gram Stain No organisms seen   Fungal Stain (Component)    Collection Time: 11/06/23 12:05 PM    Specimen: Foot, Right; Swab   Result Value    Fungal Stain No fungal or yeast elements seen   Acid Fast Bacilli Stain (Component)    Collection Time: 11/06/23 12:05 PM    Specimen: Foot, Right; Swab   Result Value    Acid Fast Bacilli Stain No acid fast bacilli seen       Radiology:   No results found.

## 2023-11-07 NOTE — Progress Notes (Addendum)
 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  Urology recommends outpatient cystoscopy.  CKD could be related to atherosclerotic disease and hypertension with diabetic nephropathy.  AKI improved.  Supportive care.  48 to 64 ounces oral fluid hydration daily  Avoid NSAID.  If IV contrast were to be required, recommend 0.9% saline infusion 50 cc/h 6 hours pre and postcontrast  Bacteremia.  Osteomyelitis of toe.  Blood culture 10/18 growing gram-negative rods; repeat blood culture 10/28 no growth so far.  Vascular surgery with plan for right lower extremity angiogram with possible intervention 10/25.  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  BP reasonable.  Continue Lopressor  25 mg twice daily.  Appears euvolemic.  No need for diuretic for now  Type 2 diabetes with CKD.  Reportedly not yet on long-term use of insulin .  Primary team managing.  Metformin  currently held  Current smoker-patient states he has tried to before but due to social issue (his wife left him) he relapsed.  Encourage patient to establish care with a PCP to assist with smoking cessation        Subjective:   Seen and examined. Interim notes reviewed.       Physical Exam:     Vitals:    11/07/23 0732 11/07/23 0918 11/07/23 1113 11/07/23 1142   BP: 163/88 (!) 174/94 (!) 188/96 154/88   Pulse: 100 94 100    Resp: 16  16    Temp: 98.8 F (37.1 C)  98.6 F (37 C)    TempSrc: Oral  Oral    SpO2: 99%  98%    Weight:       Height:         Intake and Output Summary (Last 24 hours) at Date Time    Intake/Output Summary (Last 24 hours) at 11/07/2023 1148  Last data filed at 11/07/2023  0828  Gross per 24 hour   Intake 460 ml   Output 4000 ml   Net -3540 ml     Awake and alert. No acute distress  Pale  No JVD  Chest clear to auscultation  RRR. NS1S2. No murmur  Abd soft NT. Positive BS  No LEE  No skin rash    Labs:     Recent Labs   Lab 11/07/23  0358 11/06/23  0327 11/05/23  0438   WBC 8.08 7.80 7.73   Hemoglobin 9.8* 9.8* 10.2*   Hematocrit 29.8* 30.2* 31.6*   Platelet Count 200 203 215     Recent Labs   Lab 11/07/23  0358 11/06/23  0327 11/05/23  0438 11/04/23  1756 11/04/23  1101   Sodium 136 136 139  More results in Results Review 140   Potassium 4.9 5.0 4.7  More results in Results Review 4.9   Chloride 109 110 114*  More results in Results Review 110   CO2 21 21 20   More results in Results Review 21   BUN 27 35* 30*  More results in Results Review 27   Creatinine 1.5 1.8* 1.6*  More results in Results Review 1.5   GFR 48.2* 38.8* 44.7*  More results in Results Review 48.2*   Glucose 199* 163* 153*  More results in Results Review 160*   Calcium   8.4 8.2 8.5  More results in Results Review 9.6   Albumin  --   --   --   --  3.9   More results in Results Review = values in this interval not displayed.     Recent Labs   Lab 11/07/23  1116 11/07/23  0739 11/06/23  1602 11/06/23  1225 11/06/23  0744 11/05/23  1938 11/05/23  1640 11/05/23  1133   Whole Blood Glucose POCT 244* 150* 179* 212* 178* 124* 181* 252*     Rads:   Radiological Procedure reviewed.    Bartley LOISE Pickett, MD  [phone]  [phone] (F)  11/07/23

## 2023-11-07 NOTE — Plan of Care (Signed)
 Problem: Fluid and Electrolyte Imbalance/ Endocrine  Goal: Fluid and electrolyte balance are achieved/maintained  Outcome: Progressing  Flowsheets (Taken 11/07/2023 0223)  Fluid and electrolyte balance are achieved/maintained:   Monitor/assess lab values and report abnormal values   Assess and reassess fluid and electrolyte status   Monitor for muscle weakness     Problem: Diabetes: Glucose Imbalance  Goal: Blood glucose stable at established goal  Outcome: Progressing  Flowsheets (Taken 11/07/2023 0223)  Blood glucose stable at established goal:   Assess for hypoglycemia /hyperglycemia   Monitor/assess vital signs   Coordinate medication administration with meals, as indicated   Ensure appropriate diet and assess tolerance   Ensure adequate hydration   Ensure appropriate consults are obtained (Nutrition, Diabetes Education, and Case Management)     Problem: Every Day  Goal: Infection prevention (risk for infection)  Outcome: Progressing  Flowsheets (Taken 11/07/2023 0223)  Infection prevention (risk for infection) Interventions:   Assess for signs/symptoms of infection.   Patient & family hand hygiene.     Problem: Hemodynamic Status: Cardiac  Goal: Stable vital signs and fluid balance  Outcome: Progressing  Flowsheets (Taken 11/07/2023 0223)  Stable vital signs and fluid balance:   Assess signs and symptoms associated with cardiac rhythm changes   Monitor lab values     Problem: Everyday - Heart Failure  Goal: Stable Vital Signs and Fluid Balance  Outcome: Progressing  Flowsheets (Taken 11/07/2023 0223)  Stable Vital Signs and Fluid Balance:   Daily Standing Weights in the morning using the same scale, after using the bathroom and before breadfast.  If unable to stand, zero the bed and use the bed scale   Monitor, assess vital signs and telemetry per policy   Monitor labs and report abnormalities to physician   Strict Intake/Output   Fluid Restriction   Assess for swelling/edema     Problem: Compromised Sensory  Perception  Goal: Sensory Perception Interventions  Outcome: Progressing  Flowsheets (Taken 11/07/2023 0223)  Sensory Perception Interventions: Offload heels, Pad bony prominences, Reposition q 2hrs/turn Clock, Q2 hour skin assessment under devices if present     Problem: Compromised Moisture  Goal: Moisture level Interventions  Outcome: Progressing  Flowsheets (Taken 11/07/2023 0223)  Moisture level Interventions: Moisture wicking products, Moisture barrier cream     Problem: Safety  Goal: Patient will be free from injury during hospitalization  Outcome: Progressing  Flowsheets (Taken 11/07/2023 0223)  Patient will be free from injury during hospitalization:   Assess patient's risk for falls and implement fall prevention plan of care per policy   Provide and maintain safe environment   Ensure appropriate safety devices are available at the bedside   Include patient/ family/ care giver in decisions related to safety   Hourly rounding     Problem: Pain  Goal: Pain at adequate level as identified by patient  Outcome: Progressing  Flowsheets (Taken 11/07/2023 0223)  Pain at adequate level as identified by patient:   Identify patient comfort function goal   Assess for risk of opioid induced respiratory depression, including snoring/sleep apnea. Alert healthcare team of risk factors identified.   Assess pain on admission, during daily assessment and/or before any as needed intervention(s)   Reassess pain within 30-60 minutes of any procedure/intervention, per Pain Assessment, Intervention, Reassessment (AIR) Cycle   Evaluate if patient comfort function goal is met   Evaluate patient's satisfaction with pain management progress   Offer non-pharmacological pain management interventions   Include patient/patient care companion in decisions related to  pain management as needed

## 2023-11-07 NOTE — Plan of Care (Signed)
 Problem: Pain interferes with ability to perform ADL  Goal: Pain at adequate level as identified by patient  Outcome: Progressing  Flowsheets (Taken 11/07/2023 1139 by Glennda Searles, RN)  Pain at adequate level as identified by patient:   Identify patient comfort function goal   Assess pain on admission, during daily assessment and/or before any as needed intervention(s)   Reassess pain within 30-60 minutes of any procedure/intervention, per Pain Assessment, Intervention, Reassessment (AIR) Cycle   Assess for risk of opioid induced respiratory depression, including snoring/sleep apnea. Alert healthcare team of risk factors identified.   Evaluate if patient comfort function goal is met   Evaluate patient's satisfaction with pain management progress   Offer non-pharmacological pain management interventions   Consult/collaborate with Pain Service   Consult/collaborate with Physical Therapy, Occupational Therapy, and/or Speech Therapy   Include patient/patient care companion in decisions related to pain management as needed     Problem: Side Effects from Pain Analgesia  Goal: Patient will experience minimal side effects of analgesic therapy  Outcome: Progressing  Flowsheets (Taken 11/07/2023 1139 by Glennda Searles, RN)  Patient will experience minimal side effects of analgesic therapy:   Monitor/assess patient's respiratory status (RR depth, effort, breath sounds)   Assess for changes in cognitive function   Prevent/manage side effects per LIP orders (i.e. nausea, vomiting, pruritus, constipation, urinary retention, etc.)   Evaluate for opioid-induced sedation with appropriate assessment tool (i.e. POSS)     Problem: Moderate/High Fall Risk Score >5  Goal: Patient will remain free of falls  Outcome: Progressing  Flowsheets  Taken 11/06/2023 2200 by Karry Sherrilyn Jenkins KATHEE, RN  Moderate Risk (6-13):   MOD-Consider activation of bed alarm if appropriate   MOD-Floor mat at bedside (where available) if appropriate    MOD-Remain with patient during toileting  Taken 11/04/2023 2109 by Dan Hadassah Falling, RN  High (Greater than 13):   MOD-Use of chair-pad alarm when appropriate   MOD-Remain with patient during toileting   MOD-Perform dangle, stand, walk (DSW) prior to mobilization     Problem: Compromised Sensory Perception  Goal: Sensory Perception Interventions  Outcome: Progressing  Flowsheets (Taken 11/07/2023 0223 by Karry Sherrilyn Jenkins KATHEE, RN)  Sensory Perception Interventions: Offload heels, Pad bony prominences, Reposition q 2hrs/turn Clock, Q2 hour skin assessment under devices if present     Problem: Safety  Goal: Patient will be free from injury during hospitalization  Outcome: Progressing  Flowsheets (Taken 11/07/2023 0223 by Karry Sherrilyn Jenkins KATHEE, RN)  Patient will be free from injury during hospitalization:   Assess patient's risk for falls and implement fall prevention plan of care per policy   Provide and maintain safe environment   Ensure appropriate safety devices are available at the bedside   Include patient/ family/ care giver in decisions related to safety   Hourly rounding  Goal: Patient will be free from infection during hospitalization  Outcome: Progressing  Flowsheets (Taken 11/06/2023 0418 by Rudy Colbert BRAVO, RN)  Free from Infection during hospitalization:   Assess and monitor for signs and symptoms of infection   Monitor all insertion sites (i.e. indwelling lines, tubes, urinary catheters, and drains)   Encourage patient and family to use good hand hygiene technique   Monitor lab/diagnostic results     Problem: Day of Admission - Heart Failure  Goal: Heart Failure Admission  Outcome: Progressing  Flowsheets (Taken 11/06/2023 0912 by Gailen Combe, RN)  Heart Failure Admission:   Initiate education with patient and caregiver using CHF Warning Zones  and Geologist, engineering or [employer])   Oxygen as needed     Problem: Everyday - Heart Failure  Goal: Stable Vital Signs and Fluid  Balance  Outcome: Progressing  Flowsheets (Taken 11/07/2023 0223 by Karry Sherrilyn Jenkins KATHEE, RN)  Stable Vital Signs and Fluid Balance:   Daily Standing Weights in the morning using the same scale, after using the bathroom and before breadfast.  If unable to stand, zero the bed and use the bed scale   Monitor, assess vital signs and telemetry per policy   Monitor labs and report abnormalities to physician   Strict Intake/Output   Fluid Restriction   Assess for swelling/edema  Goal: Mobility/Activity is Maintained at Optimal Level for Patient  Outcome: Progressing  Flowsheets (Taken 11/06/2023 0601 by Rudy Colbert BRAVO, RN)  Mobility/Activity is Maintained at Optimal Level for Patient:   Increase mobility as tolerated/progressive mobility protocol   Perform active/passive ROM   Assess for changes in respiratory status, level of consciousness and/or development of fatigue   Reposition patient every 2 hours and as needed unless able to reposition self   Maintain SCD's as Ordered   Consult with Physical Therapy and/or Occupational Therapy  Goal: Nutritional Intake is Adequate  Outcome: Progressing  Flowsheets (Taken 11/06/2023 0601 by Rudy Colbert BRAVO, RN)  Nutritional Intake is Adequate:   Cardiac diet-2 gm Sodium   Fluid Restricction if needed   Encourage/perform oral hygiene as appropriate   Patient and family teaching on low sodium diet   Consult/Collaborate with Nutritionist   Assess appetite,anorexia and amount of meal/food tolerated     Problem: Day of Discharge - Heart Failure  Goal: Discharge Education  Outcome: Progressing  Flowsheets (Taken 11/06/2023 0601 by Rudy Colbert BRAVO, RN)  Day of Discharge:   After Visit Summary (Discharge Instuctions) with medications   Follow-up appointments   Low Sodium Diet   Fluid Restriction   CHF Warning Zones and when to call for help   Daily Standing Weights     Problem: Hemodynamic Status: Cardiac  Goal: Stable vital signs and fluid balance  Outcome:  Progressing  Flowsheets (Taken 11/07/2023 0223 by Karry Sherrilyn Jenkins KATHEE, RN)  Stable vital signs and fluid balance:   Assess signs and symptoms associated with cardiac rhythm changes   Monitor lab values     Problem: Fluid and Electrolyte Imbalance/ Endocrine  Goal: Fluid and electrolyte balance are achieved/maintained  Outcome: Progressing  Flowsheets (Taken 11/07/2023 0223 by Karry Sherrilyn Jenkins KATHEE, RN)  Fluid and electrolyte balance are achieved/maintained:   Monitor/assess lab values and report abnormal values   Assess and reassess fluid and electrolyte status   Monitor for muscle weakness     Problem: Diabetes: Glucose Imbalance  Goal: Blood glucose stable at established goal  Outcome: Progressing  Flowsheets (Taken 11/07/2023 0223 by Karry Sherrilyn Jenkins KATHEE, RN)  Blood glucose stable at established goal:   Assess for hypoglycemia /hyperglycemia   Monitor/assess vital signs   Coordinate medication administration with meals, as indicated   Ensure appropriate diet and assess tolerance   Ensure adequate hydration   Ensure appropriate consults are obtained (Nutrition, Diabetes Education, and Case Management)     Problem: Pain  Goal: Pain at adequate level as identified by patient  Outcome: Progressing  Flowsheets (Taken 11/07/2023 1139 by Glennda Searles, RN)  Pain at adequate level as identified by patient:   Identify patient comfort function goal   Assess pain on admission, during daily assessment and/or before any as needed  intervention(s)   Reassess pain within 30-60 minutes of any procedure/intervention, per Pain Assessment, Intervention, Reassessment (AIR) Cycle   Assess for risk of opioid induced respiratory depression, including snoring/sleep apnea. Alert healthcare team of risk factors identified.   Evaluate if patient comfort function goal is met   Evaluate patient's satisfaction with pain management progress   Offer non-pharmacological pain management interventions   Consult/collaborate with Pain Service    Consult/collaborate with Physical Therapy, Occupational Therapy, and/or Speech Therapy   Include patient/patient care companion in decisions related to pain management as needed     Problem: Every Day  Goal: Infection prevention (risk for infection)  Outcome: Progressing  Flowsheets (Taken 11/07/2023 0223 by Karry Sherrilyn Jenkins KATHEE, RN)  Infection prevention (risk for infection) Interventions:   Assess for signs/symptoms of infection.   Patient & family hand hygiene.

## 2023-11-08 LAB — WHOLE BLOOD GLUCOSE POCT
Whole Blood Glucose POCT: 197 mg/dL — ABNORMAL HIGH (ref 70–100)
Whole Blood Glucose POCT: 171 mg/dL — ABNORMAL HIGH (ref 70–100)
Whole Blood Glucose POCT: 223 mg/dL — ABNORMAL HIGH (ref 70–100)

## 2023-11-08 MED ORDER — SODIUM CHLORIDE 0.9 % IV SOLN
INTRAVENOUS | Status: DC
Start: 2023-11-09 — End: 2023-11-08
  Filled 2023-11-08: qty 1000

## 2023-11-08 MED ORDER — SODIUM CHLORIDE 0.9 % IV SOLN
INTRAVENOUS | Status: AC
Start: 2023-11-09 — End: 2023-11-09
  Filled 2023-11-08: qty 1000

## 2023-11-08 MED ORDER — SODIUM CHLORIDE 0.9 % IV SOLN
INTRAVENOUS | Status: DC
Start: 2023-11-09 — End: 2023-11-08

## 2023-11-08 NOTE — PT Progress Note (Signed)
 Physical Therapy Note    Physical Therapy Treatment  Scott Monroe.  Post Acute Care Therapy Recommendations:     Discharge Recommendations:  Home with supervision    DME needs IF patient is discharging home: Crutches    Therapy discharge recommendations may change with patient status.  Please refer to most recent note for up-to-date recommendations.    Assessment:   Significant Findings: none    Patient received in bed and agreeable to participating in PT despite pain. Patient requesting to use crutches to assist in offloading RLE, but none available on the floor. Utilized FWW and patient able to use effectively - heel WB but less weight through leg than with cane. Patient does not intend to walk NWB, just with decreased weight through RLE. This indicates good safety awareness, good cog functioning, and good problem solving. Pt able to ambulate further distance today, though with increasing RLE pain. He will continue to benefit from skilled PT while in house to improve functional mobility, endurance, and independence upon discharge.    Assessment: Decreased UE strength, Decreased LE strength, Decreased endurance/activity tolerance, Impaired motor control, Decreased functional mobility, Decreased balance, Gait impairment  Progress: Progressing toward goals  Prognosis: Good  Risks/Benefits/POC Discussed with Pt/Family: With patient    Treatment Activities:    Gait Training: gait training with FWW    Patient left without needs and call bell within reach. RN notified of session outcome.   Educated the patient to role of physical therapy, plan of care, goals of therapy and HEP, weight bearing precautions.    Plan:   Treatment/Interventions: Exercise, Gait training, Stair training, Neuromuscular re-education, Functional transfer training, LE strengthening/ROM, Endurance training, Patient/family training, Equipment eval/education        PT Frequency: 3-4x/wk     Continue plan of care.    Unit: [facility] ORIG BLDG 5 WEST  Bed: FO576/FO576.01     Precautions and Contraindications:   Precautions  Weight Bearing Status: RLE heel weight bearing (in post op shoe)  Fall Risks: Medium    Updated Medical Status/Imaging/Labs:     No results found.  Results for orders placed or performed during the hospital encounter of 11/04/23 (from the past 24 hours)    Collection Time: 11/07/23  3:03 PM   Result Value    Whole Blood Glucose POCT 145 (H)    Collection Time: 11/08/23  7:25 AM   Result Value    Whole Blood Glucose POCT 197 (H)    Collection Time: 11/08/23 11:23 AM   Result Value    Whole Blood Glucose POCT 171 (H)         Subjective:    I don't quit  Patient Goal: To get better     Patient's medical condition is appropriate for Physical Therapy intervention at this time.  Patient is agreeable to participation in the therapy session. Nursing clears patient for therapy.    Patient Goal: To get better    Pain Assessment  Pain Assessment: Numeric Scale (0-10)  Pain Score: 8-severe pain  Pain Location: Foot  Pain Orientation: Right  Pain Intervention(s): Medication (See eMAR) (RN pre-medicated but also made aware)     Objective:   Patient is in bed with dressings, peripheral IV, and indwelling urinary catheter in place.  Pt wore mask during therapy session:No      Functional Mobility:  Supine to Sit: Independent  Scooting to EOB: Independent  Sit to Supine: Independent  Sit to Stand: Modified Independent  Stand to Sit: Modified Independent     Ambulation:  PMP - Progressive Mobility Protocol   PMP Activity: Step 7 - Walks out of Room  Distance Walked (ft) (Step 6,7): 40 Feet     Ambulation: Supervision;with front-wheeled walker  Pattern: R decreased stance time;decreased cadence;decreased step length  Weight Bearing Observed: Heel WB;Able to maintain weight bearing precautions;Required instruction to maintain weight bearing status    Neuro Re-Ed  Sitting Balance: independent    Patient Participation: good   Patient  Endurance: good     Balance  Static Sitting: good   Dynamic Sitting: good   Static Standing: good   Dynamic Standing: good      Patient left with call bell within reach, all needs met, SCDs off (as found), fall mat down, bed alarm off (as found, pt mobilizes well with excellent balance), and all questions answered. RN notified of session outcome and patient response.     Goals  Time for Goal Acheivement: 5 visits  Pt Will Perform Sit to Stand: Goal met  Pt Will Transfer Bed/Chair: with rolling walker, modified independent  Pt Will Ambulate: > 200 feet, with rolling walker, with supervision    PPE worn during session: procedural mask and gloves  Tech present: None  PPE worn by tech: N/A  Translator: None    Time of Treatment:  PT Received On: 11/08/23  Start Time: 1121  Stop Time: 1137  Time Calculation (min): 16 min    Treatment # 1 out of 5 visits    -Delaine Hanly, PT, DPT, MS

## 2023-11-08 NOTE — Progress Notes (Signed)
 Office: [phone]  Epic GroupChat: [facility] ([facility])    Date Time: 11/08/23 @NOW   Patient Name: Scott Monroe, Scott Monroe.      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  Approximately 3 cc of purulent drainage expressed, drainage was cultured.   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 Patient reports continued pain at surgical site, which he describes as throbbing.    Antimicrobials:   #4 Piperacillin-tazobactam    D/c  #2 vanc IV  Estimated Creatinine Clearance: 34.2 mL/min (based on SCr of 1.5 mg/dL).      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 Path and cultures pending. Original wound cultures significant for S.lugdunensis and PsA. Piperacillin-tazobactam should likely cover both. Duration of antibiotics will depend on pathology results. He will likely require at least 2 weeks for overlying ABSSSI. Patient will likely be able to go on combination of levofloxacin and cephalexin to cover S.lugdunensis and Pseudomonas aeruginosa.    Plan:   - CONTINUE Piperacillin-tazobactam for now while in-patient  - when otherwise ready for discharge, patient may be able to go on cephalexin and levofloxacin to complete his course of therapy  - follow up  OR cultures, pathology    Dr. Carmella is covering over the weekend. Please call with questions.    Monitoring clinical response and untoward effects of high risk/IV antimicrobials    - Reviewed labs ordered by the primary team, including most recent blood counts, chemistry, as well as labs recommended by ID/myself  - Reviewed notes by the primary team and other consultants if available  - Recommendations including additional work-up/testing to be ordered as above  - Discussed my assessment and recommendations with primary team    Lines:     Patient Lines/Drains/Airways Status       Active PICC Line / CVC Line / PIV Line / Drain / Airway / Intraosseous Line / Epidural Line / ART Line / Line / Wound / Pressure Ulcer / NG/OG Tube       Name Placement date Placement time Site Days    Peripheral IV 11/06/23 Left Forearm 11/06/23  1709  Forearm  less than 1    Urethral Catheter Non-latex;Straight-tip 16 Fr. 11/02/23  1112  Non-latex;Straight-tip  4                    *I have performed a risk-benefit analysis and the patient needs a central line for access and IV medications      Review of Systems:   Detailed 10 system review was done; pertinent  positives and negatives listed in HPI, otherwise negative in details.      Physical Exam:     Vitals:    11/08/23 0821   BP: 125/74   Pulse: 75   Resp:    Temp:    SpO2:        General Appearance: alert and appropriate, non-toxic  Neuro: alert, oriented, normal speech, normal attention and cognition  HEENT: no scleral icterus, pupils round and reactive, OP clear  Neck: supple  Lungs: clear to auscultation, no wheezes, rales or rhonchi, symmetric air entry   Cardiac: normal rate, regular rhythm, normal S1, S2,   Abdomen: soft, non-tender, non-distended, normal active bowel sounds  Extremities: left foot in primary surgical dressing  Skin: no rash  Psych: normal mood and affect      Family History:   Family History[1]    Social History:   Social History[2]    Allergies:    Allergies[3]    Labs:   Available labs reviewed from EMR, independently interpreted    Microbiology:   All data reviewed and pertinent info summarized above, independently interpeted  Rads:   No results found.    Signed by: Marsa JINNY Hefty, MD           [1] No family history on file.  [2]   Social History  Socioeconomic History    Marital status: Divorced   Tobacco Use    Smoking status: Every Day     Current packs/day: 1.00     Types: Cigarettes    Smokeless tobacco: Never   Vaping Use    Vaping status: Never Used   Substance and Sexual Activity    Alcohol use: Never    Drug use: Never     Social Drivers of Psychologist, [employer] Strain: High Risk (11/04/2023)    Overall Financial Resource Strain (CARDIA)     Difficulty of Paying Living Expenses: Hard   Food Insecurity: No Food Insecurity (11/04/2023)    Hunger Vital Sign     Worried About Running Out of Food in the Last Year: Never true     Ran Out of Food in the Last Year: Never true   Recent Concern: Food Insecurity - Food Insecurity Present (08/29/2023)    Received from [facility]    Hunger Vital Sign     Within the past 12 months, you worried that your food would run out before you got the money to buy more.: Sometimes true     Within the past 12 months, the food you bought just didn't last and you didn't have money to get more.: Sometimes true   Transportation Needs: No Transportation Needs (11/04/2023)    PRAPARE - Therapist, Art (Medical): No     Lack of Transportation (Non-Medical): No   Recent Concern: Transportation Needs - Unmet Transportation Needs (08/29/2023)    [facility]    [facility] - Transportation     In the past 12 months, has lack of transportation kept you from medical appointments or from getting medications?: Yes     In the past 12 months, has lack of transportation kept you from meetings, work, or from getting things needed for daily  living?: Yes   Physical Activity: Inactive (04/22/2021)    Received from [facility]    Exercise Vital Sign     On average, how many days per week  do you engage in moderate to strenuous exercise (like a brisk walk)?: 0 days     On average, how many minutes do you engage in exercise at this level?: 0 min   Stress: No Stress Concern Present (01/15/2022)    Received from [facility]    [facility] - Occupational Stress Questionnaire     Feeling of Stress : Only a little   Social Connections: Socially Isolated (01/15/2022)    Received from [facility]    Social Connection and Isolation Panel     In a typical week, how many times do you talk on the phone with family, friends, or neighbors?: More than three times a week     How often do you get together with friends or relatives?: Never     How often do you attend church or religious services?: Never     Do you belong to any clubs or organizations such as church groups, unions, fraternal or athletic groups, or school groups?: No     How often do you attend meetings of the clubs or organizations you belong to?: Never     Are you married, widowed, divorced, separated, never married, or living with a partner?: Separated   Intimate Partner Violence: Not At Risk (11/04/2023)    Humiliation, Afraid, Rape, and Kick questionnaire     Fear of Current or Ex-Partner: No     Emotionally Abused: No     Physically Abused: No     Sexually Abused: No   Housing Stability: Not At Risk (11/04/2023)    Housing Stability NCSS     Do you have housing?: Yes     Are you worried about losing your housing?: No   [3]   Allergies  Allergen Reactions    Strawberry C [Ascorbate] Hives    Strawberry Extract Hives

## 2023-11-08 NOTE — Nursing Progress Note (Signed)
 Patient refused to change the securement device as it was high and dinot want the loop but he said he has been doing that and doenot want to change it. Provided the education, Charge Nurse Alfonso and [place] notified.

## 2023-11-08 NOTE — Progress Notes (Signed)
 Case Management location: onsite    LOS # 4      CASE MANAGEMENT PROGRESS NOTE:    Patient: Scott Monroe, MO. (78 y.o., male)  Admission Date: 11/04/2023  Hospital Day: 4  Admission Diagnosis:Bacteremia [R78.81]      Summary of Discharge Plan:  Home with HH: PT/OT/SN  Pt has FWW and Shower Chair    Identified Possible Discharge Barriers:  [facility] planned for 10/25    CM Interventions and Outcome:  CM/RN/MD rounds completed. Pt not medically ready for [place] pending resolution of barriers noted above.     CM met with patient at bedside and discussed  plans. Pt agreeable to home health services. Pt states he is current with a home health agency, but uncertain of the name. CM called pt's daughter Scott Monroe [phone]. Scott Monroe reports she does not know the name of the hh agency at this time, but will try to find the name and provide an update.     CM sent [facility] referral for [facility] services.    Discussed above Discharge Plan with (patient, family, Care Team, others):  Care team, patient, daughterGLENWOOD Scott Monroe    CM will continue to follow for transition of care needs.     Sari Forster RN, BSN  Case Manager I  [facility]

## 2023-11-08 NOTE — Progress Notes (Signed)
 PROGRESS NOTE  11/08/2023  Assessment/Recommendations:   AKI on CKD3a-related to septic ATN from bacteremia with some possible contribution from contrast associated AKI given contrast exposure reportedly on 10/18.  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  Urology recommends outpatient cystoscopy.  CKD could be related to atherosclerotic disease and hypertension with diabetic nephropathy.  AKI improved.  Supportive care.  48 to 64 ounces oral fluid hydration daily  Avoid NSAID.  If IV contrast were to be required, recommend 0.9% saline infusion 50 cc/h 6 hours pre and postcontrast  Bacteremia.  Osteomyelitis of toe.  Blood culture 10/18 growing gram-negative rods; repeat blood culture 10/28 no growth so far.  Vascular surgery with plan for right lower extremity angiogram with possible intervention 10/25.  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  BP reasonable.  Continue Lopressor  25 mg twice daily.  Appears euvolemic.  No need for diuretic for now  Type 2 diabetes with CKD.  Reportedly not yet on long-term use of insulin .  Primary team managing.  Metformin  currently held  Current smoker-patient states he has tried to before but due to social issue (his wife left him) he relapsed.  Encourage patient to establish care with a PCP to assist with smoking cessation        Subjective:   Seen and examined. Interim notes reviewed.       Physical Exam:     Vitals:    11/08/23 0430 11/08/23 0600 11/08/23 0724 11/08/23 0821   BP: 118/68  135/73 125/74   Pulse: 60  79 75   Resp: 14  17    Temp: 98.2 F (36.8 C)  98.4 F (36.9 C)    TempSrc: Oral  Oral    SpO2: 98%  99%    Weight:  56.8 kg (125 lb 3.5 oz)     Height:         Intake and Output Summary (Last 24 hours) at Date Time    Intake/Output Summary (Last 24 hours) at 11/08/2023 0951  Last data filed at  11/08/2023 0539  Gross per 24 hour   Intake 300 ml   Output 2300 ml   Net -2000 ml     Awake and alert. No acute distress  Pale  No JVD  Chest clear to auscultation  RRR. NS1S2. No murmur  Abd soft NT. Positive BS  No LEE  No skin rash    Labs:     Recent Labs   Lab 11/07/23  0358 11/06/23  0327 11/05/23  0438   WBC 8.08 7.80 7.73   Hemoglobin 9.8* 9.8* 10.2*   Hematocrit 29.8* 30.2* 31.6*   Platelet Count 200 203 215     Recent Labs   Lab 11/07/23  0358 11/06/23  0327 11/05/23  0438 11/04/23  1756 11/04/23  1101   Sodium 136 136 139  More results in Results Review 140   Potassium 4.9 5.0 4.7  More results in Results Review 4.9   Chloride 109 110 114*  More results in Results Review 110   CO2 21 21 20   More results in Results Review 21   BUN 27 35* 30*  More results in Results Review 27   Creatinine 1.5 1.8* 1.6*  More results in Results Review 1.5   GFR 48.2* 38.8* 44.7*  More results in Results Review 48.2*   Glucose 199* 163* 153*  More results in Results Review 160*  Calcium  8.4 8.2 8.5  More results in Results Review 9.6   Albumin  --   --   --   --  3.9   More results in Results Review = values in this interval not displayed.     Recent Labs   Lab 11/08/23  0725 11/07/23  1503 11/07/23  1116 11/07/23  0739 11/06/23  1602 11/06/23  1225 11/06/23  0744 11/05/23  1938   Whole Blood Glucose POCT 197* 145* 244* 150* 179* 212* 178* 124*     Rads:   Radiological Procedure reviewed.    Bartley LOISE Pickett, MD  [phone]  [phone] (F)  11/08/23

## 2023-11-08 NOTE — Progress Notes (Signed)
 Vascular Surgery Progress Note  [id number]    Date Time: 11/08/23 5:12 PM  Patient Name: Scott Monroe.  Attending Vascular Physician: Dr. Leona Salvage Day: 5     PROCEDURES   11/06/2023: R partial hallux amputation (podiatry)     INTERVAL EVENTS       NAEON  NPO since midnight for angiogram tomorrow with Dr. Marinus   WBC 8.32  hgb 9.2 Cr. 1.6  VSS, vitals are stable   IVF ordered but  DID NOT GIVEN  Will order post procedure saline infusion post op     ASSESSMENT    Scott Monroe. is a 78 y.o. male w/ PMHx T2DM, HTN, HLD, A. Fib on Eliquis , long time smoker (since 1960s), diabetic neuropathy , bilateral hydronephrosis, bladder mass, chronic foley for urinary retention who presented on 10/20 with a diabetic R great toe ulcer and admitted for GNR bacteremia.   Pt has significant vascular disease in both lower extremities with tissue loss of the right foot.      PLAN   Angiogram today with Dr. Marinus  Completed pre-procedure hydration, will order post procedure hydration after cath lab   Continue NV checks  Remainder of care per primary team     IMAGING RESULTS   No results found.     ADDITIONAL Dx         Highest Value Lowest Value Decrease   Hemoglobin 12.3 g/dl  89/79/7974 88:98 AM 9.8 g/dl  89/76/7974  6:41 AM -2.5 g/dl   Hematocrit 61.8%  89/79/7974 11:01 AM  29.8%  11/07/2023  3:58 AM -8.3%           LABS       Hematology   Recent Labs     11/07/23  0358 11/06/23  0327   WBC 8.08 7.80   Hemoglobin 9.8* 9.8*   Hematocrit 29.8* 30.2*   Platelet Count 200 203        Coagulation   No results for input(s): PT, INR, PTT in the last 72 hours.    Chemistry   Recent Labs     11/07/23  0358 11/06/23  0327   Sodium 136 136   Potassium 4.9 5.0   Chloride 109 110   CO2 21 21   BUN 27 35*   Creatinine 1.5 1.8*   Glucose 199* 163*   Calcium  8.4 8.2        Liver Function Tests   No results for input(s): AST, ALT, ALKPHOS, PROT, ALB, BILITOTAL, BILIDIRECT, LIP, AMY, PREALB in  the last 72 hours.          MEDICATIONS   Current Facility-Administered Medications[1]    benzocaine-menthol, benzonatate, carboxymethylcellulose sodium, dextrose  **OR** dextrose  **OR** dextrose  **OR** glucagon  (rDNA), dextrose  **OR** dextrose  **OR** dextrose  **OR** glucagon  (rDNA), magnesium  sulfate, melatonin, naloxone , oxyCODONE , oxyCODONE , potassium & sodium phosphates , potassium chloride  **OR** potassium chloride  **OR** potassium chloride , saline     INPUT/OUTPUT     Patient Lines/Drains/Airways Status       Active Lines, Drains and Airways       Name Placement date Placement time Site Days    Peripheral IV 11/06/23 Left Forearm 11/06/23  1709  Forearm  2    Urethral Catheter Non-latex;Straight-tip 16 Fr. 11/02/23  1112  Non-latex;Straight-tip  6                      REVIEW OF SYSTEMS   A complete review of systems was performed and was  negative except for the pertinent positives documented in the HPI.     PHYSICAL EXAM    Current Vitals:   Vitals:    11/08/23 1518   BP: 123/66   Pulse: 68   Resp: 18   Temp: 98.4 F (36.9 C)   SpO2: 99%       Vital signs in the last 24hrs:   Temp:  [98.1 F (36.7 C)-98.4 F (36.9 C)] 98.4 F (36.9 C)  Heart Rate:  [60-104] 68  Resp Rate:  [14-18] 18  BP: (117-146)/(66-85) 123/66     General: NAD  Neuro: A&O x 3 speech clear and congruent, face symmetric, tongue midline  Cardio: RRR   Pulm: unlabored breathing    Abd: soft, ND, NTTP     Extremities:    Warm   Motor: intact     Sensory: intact     Pulse Exam:   Lower Ext:  Right Left   Femoral: Non - palpable, multiphasic  Femoral: non-palpable, biphasic    Pop: monophasic Pop: monophasic    DP: absent   AT: monophasic  DP: monophasic (distal only)  AT: absent    PT: monophasic  PT: monophasic       Procedure and risks discussed he understands and consents to the same.             [1]   Current Facility-Administered Medications   Medication Dose Route Frequency    acetaminophen   1,000 mg Oral TID    amLODIPine  10 mg Oral  Daily    aspirin  EC  81 mg Oral Daily    atorvastatin   80 mg Oral Daily    gabapentin   600 mg Oral TID    heparin  (porcine)  5,000 Units Subcutaneous Q8H [facility]    insulin  lispro  1-5 Units Subcutaneous TID AC    metoprolol  tartrate  25 mg Oral Q12H [facility]    piperacillin-tazobactam  4.5 g Intravenous Q8H    tamsulosin   0.4 mg Oral Daily after dinner    traZODone   50 mg Oral QHS

## 2023-11-08 NOTE — Progress Notes (Signed)
 Foot & Ankle Surgery Progress Note  Spectra  [phone] / Pager [phone]    Date Time: 11/08/23 4:14 PM  Patient Name: Scott Monroe, [name]  Consulting Physician: Rea Agent, [facility] Day: 5      Assessment:   Scott Staggs. is a 78 y.o. male with PMH T2DM (A1c 7.78 2 months ago), HTN, HLD, Afib on Eliquis , smoker, who presents for R hallux OM.  Admitted by medicine for IV antibiotics and further workup of bacteremia 2/2 UTI versus right hallux OM on 11/04/2023. Right hallux wound with seropurulent drainage and probe to bone concerning for osteomyelitis prior to amputation.     Operative Procedures:  10/18 Right hallux bedside I&D prior to discharge from ED  10/22 Right hallux bedside partial hallux amputation, closed primarily     Vitals  - Afebrile, VSS     Labs  - WBC: 8.32  - Hgb: 9.2  - Na: 134     Micro/path  - 10/18 Right hallux bedside wcx: MG Pseudomonas aeruginosa, LG Staphylococcus lugdunensis, MG GPC (final)  - 10/18 bcx: GNR  -10/20 repeat bcx: NGTD  - 10/22 IntraOp Right hallux cx: VLG Pseudomonas aeruginosa  - 10/22 Right hallux Path: Pending    Imaging  - 10/18 Right hallux XR: Distal phalanx osteomyelitis   - 10/18 NIUS R ABI 0.34, TBI 0.14, L ABI 0.27 TBI 0.15     Plan:   - No further surgical plans and no barriers to discharge from podiatry perspective  - POD 3 s/p right partial hallux amputation closed primarily.  - Patient scheduled for right lower extremity angiogram with vascular surgery today (10/25).  Plan to change dressing following angiogram.  - Continue follow-up bcx, wcx, and pathology   - ABX per ID: continue Zosyn  - Heel Weight Bearing to the right lower extremity  - DVT ppx per primary: Eliquis   - Wound Care: Xeroform, 4x4 gauze, Kerlix, and Webril.  Please keep dressings intact, dressing to be changed by podiatry following right lower extremity angiogram today 10/25  - Will discuss plan with attending, Dr. Rea Agent, DPM,  and continue to follow    Ozell Setting, DPM  Resident - Foot and Ankle Surgery   [facility] - PGY-1  [phone] [facility] On-Call Pager)   [phone] (Personal Pager)      Subjective:   Interval History:     Was seen at bedside today resting comfortably in bed. No unanticipated adverse events overnight.  Denies nausea, vomiting, fever, chills.    Review of Symptoms:   As per HPI, stated above.    Physical Exam:   BP 123/66   Pulse 68   Temp 98.4 F (36.9 C) (Oral)   Resp 18   Ht 1.727 m (5' 7.99)   Wt 56.8 kg (125 lb 3.5 oz)   SpO2 99%   BMI 19.04 kg/m     Gen: NAD, alert and oriented x3  Heart: normal rate  Lung: non-labored, symmetrical chest rise, no audible wheezing  Abdomen: non-distended, soft and non-tender to palpation    Lower Extremity Exam:  Derm:  - Right: Dressing CDI.  No evidence of strikethrough  - Left: No open lesions, no interdigital maceration, no clinical signs of infection       Vasc:  - Right: pulses nonpalpable, Dopplers monophasic  - Left: Pulses nonpalpable, Dopplers monophasic     Neuro:  - Right: Gross sensation intact. Gross motor function intact.  - Left: Gross sensation intact.  Gross motor function intact.     MSK:  - Right: compartments soft, nontender, compressible. s/p partial hallux amputation  - Left: No gross deformities, compartments soft, nontender, compressible    Labs:     Recent Labs   Lab 11/07/23  0358 11/06/23  0327 11/05/23  0438 11/04/23  1756   WBC 8.08 7.80 7.73 9.18   RBC 3.51* 3.54* 3.72* 3.96*   Hemoglobin 9.8* 9.8* 10.2* 11.0*   Hematocrit 29.8* 30.2* 31.6* 33.3*   Glucose 199* 163* 153* 167*   BUN 27 35* 30* 32*   Creatinine 1.5 1.8* 1.6* 1.8*   Calcium  8.4 8.2 8.5 9.1   Sodium 136 136 139 141   Potassium 4.9 5.0 4.7 4.8   Chloride 109 110 114* 113*   CO2 21 21 20 19        Microbiology:     Recent Results (from the past 360 hours)   Culture, Urine    Collection Time: 11/02/23 12:14 PM    Specimen: Urine, Clean Catch   Result Value    Culture Urine >100,000 CFU/mL Klebsiella  pneumoniae (A)    Culture Urine >100,000 CFU/mL Escherichia coli (A)       Susceptibility    Escherichia coli - MIC     Amoxicillin/Clavulanic acid <=4/2 Susceptible ug/mL     Ampicillin* <=4 Susceptible ug/mL      * If oral therapy is desired AND ampicillin is susceptible, consider amoxicillin. [place] [facility]imicrobial [facility]committee Nov. 2020     Ampicillin/Sulbactam 2/1 Susceptible ug/mL     Aztreonam <=2 Susceptible ug/mL     Cefazolin* <=1 Susceptible ug/mL      * Cefazolin interpretation is for uncomplicated UTI only. If oral therapy is desired for uncomplicated UTI AND E.coli is susceptible to cefazolin, consider cephalexin, cefdinir, cefpodoxime, or cefprozil. [place] Antimicrobial Subcommittee Nov. 2020     Cefepime <=1 Susceptible ug/mL     Cefoxitin <=4 Susceptible ug/mL     Ceftazidime <=2 Susceptible ug/mL     Ceftriaxone* <=1 Susceptible ug/mL      * Ceftriaxone does not predict cefdinir susceptibility. [place] Antimicrobial Subcommittee Nov. 2020     Cefuroxime 8 Intermediate ug/mL     Ciprofloxacin <=0.25 Susceptible ug/mL     Ertapenem <=0.25 Susceptible ug/mL     Gentamicin <=2 Susceptible ug/mL     Levofloxacin <=0.5 Susceptible ug/mL     Meropenem <=0.5 Susceptible ug/mL     Nitrofurantoin* <=16 Susceptible ug/mL      * Nitrofurantoin should only be used for the treatment of uncomplicated cystitis. [place] Antimicrobial Subcommittee June 2015.     Piperacillin/Tazobactam <=2/4 Susceptible ug/mL     Tetracycline* <=2 Susceptible ug/mL      * Enterobacterales susceptible to tetracycline are also considered susceptible to doxycycline and minocycline. CLSI M100-ED33:2023     Trimethoprim/Sulfamethoxazole <=0.5/9.5 Susceptible ug/mL    Klebsiella pneumoniae - MIC     Amoxicillin/Clavulanic acid <=4/2 Susceptible ug/mL     Ampicillin* >16 Resistant ug/mL      * If oral therapy is desired AND ampicillin is susceptible, consider amoxicillin. [place] [facility]imicrobial Subcommittee Nov. 2020     Ampicillin/Sulbactam  8/4 Susceptible ug/mL     Aztreonam <=2 Susceptible ug/mL     Cefazolin* <=1 Susceptible ug/mL      * Cefazolin interpretation is for uncomplicated UTI only. If oral therapy is desired for uncomplicated UTI AND K. pneumoniae is susceptible to cefazolin, consider cephalexin, cefdinir, cefpodoxime, or cefprozil. [name] [facility]im[facility]committee Nov. 2020  Cefepime <=1 Susceptible ug/mL     Cefoxitin <=4 Susceptible ug/mL     Ceftazidime <=2 Susceptible ug/mL     Ceftriaxone* <=1 Susceptible ug/mL      * Ceftriaxone does not predict cefdinir susceptibility. [place] Antimicrobial Subcommittee Nov. 2020     Cefuroxime <=4 Susceptible ug/mL     Ciprofloxacin <=0.25 Susceptible ug/mL     Ertapenem <=0.25 Susceptible ug/mL     Gentamicin <=2 Susceptible ug/mL     Levofloxacin <=0.5 Susceptible ug/mL     Meropenem <=0.5 Susceptible ug/mL     Nitrofurantoin* 64 Intermediate ug/mL      * Nitrofurantoin should only be used for the treatment of uncomplicated cystitis. [place] [facility] Antimicrobial Subcommittee June 2015.     Piperacillin/Tazobactam 4/4 Susceptible ug/mL     Tetracycline* <=2 Susceptible ug/mL      * Enterobacterales susceptible to tetracycline are also considered susceptible to doxycycline and minocycline. CLSI M100-ED33:2023     Trimethoprim/Sulfamethoxazole <=0.5/9.5 Susceptible ug/mL   Culture, Blood, Aerobic And Anaerobic    Collection Time: 11/02/23  1:15 PM    Specimen: Blood, Venous   Result Value    Culture Blood No growth at 5 days   Culture, Blood, Aerobic And Anaerobic    Collection Time: 11/02/23  1:31 PM    Specimen: Blood, Venous   Result Value    Culture Blood Growth of Gram negative rod (A)    Gram Stain Aerobic bottle positive for Gram negative rods (AA)   Culture And Gram Stain, Aerobic Bacteria, Wound/Tissue/Fluid    Collection Time: 11/02/23  2:21 PM    Specimen: Toe, Right; Swab   Result Value    Culture Aerobic Bacteria Moderate growth of Pseudomonas aeruginosa (A)    Culture Aerobic Bacteria  Moderate growth of Pseudomonas aeruginosa (A)    Culture Aerobic Bacteria Light growth of Staphylococcus lugdunensis (A)    Gram Stain Few WBCs (A)    Gram Stain No epithelial cells (A)    Gram Stain Moderate Gram positive cocci (A)       Susceptibility    Pseudomonas aeruginosa - MIC     Aztreonam 4 Susceptible ug/mL     Cefepime 2 Susceptible ug/mL     Ceftazidime <=2 Susceptible ug/mL     Ciprofloxacin <=0.25 Susceptible ug/mL     Levofloxacin <=0.5 Susceptible ug/mL     Meropenem <=0.5 Susceptible ug/mL     Piperacillin/Tazobactam 4/4 Susceptible ug/mL     Tobramycin <=2 Susceptible ug/mL    Pseudomonas aeruginosa - MIC     Aztreonam 8 Susceptible ug/mL     Cefepime 2 Susceptible ug/mL     Ceftazidime <=2 Susceptible ug/mL     Ciprofloxacin <=0.25 Susceptible ug/mL     Levofloxacin <=0.5 Susceptible ug/mL     Meropenem <=0.5 Susceptible ug/mL     Piperacillin/Tazobactam 4/4 Susceptible ug/mL     Tobramycin <=2 Susceptible ug/mL   Fungal Stain (Component)    Collection Time: 11/02/23  2:21 PM    Specimen: Toe, Right; Swab   Result Value    Fungal Stain No fungal or yeast elements seen   Acid Fast Bacilli Stain (Component)    Collection Time: 11/02/23  2:21 PM    Specimen: Toe, Right; Swab   Result Value    Acid Fast Bacilli Stain No acid fast bacilli seen   Culture, Blood, Aerobic And Anaerobic    Collection Time: 11/04/23 11:01 AM    Specimen: Blood, Venous   Result Value    Culture  Blood No growth at 4 days   Nares, MRSA (methicillin-resistant Staphylococcus aureus) Screening, PCR    Collection Time: 11/04/23  9:40 PM    Specimen: Nares; Swab   Result Value    MRSA (methicillin resistant Staphylococcus aureus) DNA Not Detected   Culture And Gram Stain, Aerobic Bacteria, Wound/Tissue/Fluid    Collection Time: 11/06/23 12:05 PM    Specimen: Foot, Right; Swab   Result Value    Culture Aerobic Bacteria Very light growth of Pseudomonas aeruginosa (A)    Gram Stain No squamous epithelial cells seen    Gram Stain Few  WBCs    Gram Stain No organisms seen   Fungal Stain (Component)    Collection Time: 11/06/23 12:05 PM    Specimen: Foot, Right; Swab   Result Value    Fungal Stain No fungal or yeast elements seen   Acid Fast Bacilli Stain (Component)    Collection Time: 11/06/23 12:05 PM    Specimen: Foot, Right; Swab   Result Value    Acid Fast Bacilli Stain No acid fast bacilli seen       Radiology:   No results found.

## 2023-11-08 NOTE — Provider Clarification Note (Signed)
 Patient Name: Seyed, Heffley.  Account #: [id number]   MR #: [id number]  Discharge Date: @DCDATE @          Thank you for responding!    Documentation Query sent by: Mahmud A. Phil  Date:  11/07/2023        PROVIDER RESPONSE Insert a query response from the list above or add free text:  Anemia of other chronic disease

## 2023-11-08 NOTE — Progress Notes (Signed)
 [facility]  Internal Medicine Hospitalists  Progress Note        Assessment / Plan:        Scott Encinas. is a 78 y.o. male with diabetes, hypertension, hyperlipidemia, chronic HFpEF (grade 1) A-fib (on Eliquis ) with recent ED visit 11/02/2023 for right hallux osteomyelitis with overlying abscess who presented to the emergency room and was noted to have gram-negative bacteremia and UTI with + Ucx(now growing Klebsiella and E. coli).  Recent wound culture growing Pseudomonas and staph ludgenesis.    #Gram-negative bacteremia-requiring longer incubation.  -Followed by ID most likely source CAUTI versus upper respiratory tract infection  - Per ID toe is a possible source as well  - Continue with Zosyn per ID    #Osteomyelitis of toe  #Pseudomonas and staph L.  - Possible angio Thursday versus Saturday  - Status post bedside amputation.  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  #Bladder mass  #Urinary retention  #CAUTI - Kelbsiella/ecoli   -Continue with Zosyn per ID  - Continue with Foley catheter  - Urology recommending outpatient cystoscopy (has evaluated previously)    #Hyperlipidemia  - Continue with atorvastatin     #Insomnia  - Trazodone     #Acute on chronic renal failure  - Has not been following with specialists (is going to need a transitional care appointment prior to leaving)  - Says he has been told he has issues with renal failure in the past  - Looks like his baseline is around 1.3, however it is now worsening, theoretically could be contrast-induced nephropathy from CT angio done in 10/18.  - Since angio is planned for tomorrow will get nephrology involved and start IV fluids  - Continue to monitor  - Renal ultrasound without evidence of hydronephrosis mild asymmetry of right kidney when compared to left but no echogenicity to suggest evidence of chronic renal disease, prostamegaly  noted  - Hold valsartan     #Diabetes  #Neuropathy  - Continue gabapentin   - Holding metformin   - Sliding scale insulin     # chronic HFpEF and grade 1 DD  # Hypertension  # CAD  #A-fib  - Continue with atorvastatin   - Metoprolol  25 twice daily  - Holding valsartan  in the setting of renal failure  - Continue with aspirin   - Holding Eliquis  for possible surgical intervention (angio tomorrow), CHA2DS2-VASc is 4 no need to bridge, continue with DVT prophylaxis    VTE Prophylaxis: apixaban  (ELIQUIS ) tablet    heparin  (porcine) injection 5,000 Units       Foley Catheter: Foley for an urinary retention    Venous Access: No Temporary Central Line Present    Medical Readiness for Discharge: Anticipated in 5+ Days    Open Handoff Activity in Sidebar    Current Facility-Administered Medications[1]  Infusion Meds[2]  PRN Medications[3]  Allergies[4]    Subjective:         Reports some throbbing in his lower extremity.  Says he has not been following up just recently because he has a very low income (800 dollars a month) and co-pay is too high for him to afford.  Chest pain no shortness of breath no nausea no vomiting.         Objective:      Temp:  [98.2 F (36.8 C)-98.8 F (37.1 C)] 98.4 F (36.9 C)  Heart Rate:  [  60-104] 79  Resp Rate:  [14-17] 17  BP: (117-188)/(68-96) 135/73   General: awake, alert, oriented x 3; no acute distress  HEENT: anicteric sclerae, PERRL, EOMI; MMM  Cardiovascular: regular rate and rhythm; no murmurs, rubs, or gallops  Lungs: clear to auscultation bilaterally without wheezing, rhonchi, or rales  Abdomen: soft, non-distended; non-tender to palpation, no rebound or guarding  Extremities: warm; no LE edema; no clubbing or cyanosis  Neuro: symmetric facial movements, clear speech, moving all extremities                            [1]   Current Facility-Administered Medications   Medication Dose Route Frequency    acetaminophen   1,000 mg Oral TID    amLODIPine  10 mg Oral Daily    aspirin  EC  81 mg  Oral Daily    atorvastatin   80 mg Oral Daily    gabapentin   600 mg Oral TID    heparin  (porcine)  5,000 Units Subcutaneous Q8H SCH    insulin  lispro  1-5 Units Subcutaneous TID AC    metoprolol  tartrate  25 mg Oral Q12H SCH    piperacillin-tazobactam  4.5 g Intravenous Q8H    tamsulosin   0.4 mg Oral Daily after dinner    traZODone   50 mg Oral QHS   [2]   Current Facility-Administered Medications   Medication Dose Route Frequency Last Rate   [3]   Current Facility-Administered Medications   Medication Dose Route    benzocaine-menthol  1 lozenge Buccal    benzonatate  100 mg Oral    carboxymethylcellulose sodium  1 drop Both Eyes    dextrose   15 g of glucose Oral    Or    dextrose   12.5 g Intravenous    Or    dextrose   12.5 g Intravenous    Or    glucagon  (rDNA)  1 mg Intramuscular    dextrose   15 g of glucose Oral    Or    dextrose   12.5 g Intravenous    Or    dextrose   12.5 g Intravenous    Or    glucagon  (rDNA)  1 mg Intramuscular    magnesium  sulfate  1 g Intravenous    melatonin  3 mg Oral    naloxone   0.2 mg Intravenous    oxyCODONE   10 mg Oral    oxyCODONE   5 mg Oral    potassium & sodium phosphates   2 packet Oral    potassium chloride   0-60 mEq Oral    Or    potassium chloride   0-60 mEq Oral    Or    potassium chloride   10 mEq Intravenous    saline  2 spray Each Nare   [4]   Allergies  Allergen Reactions    Strawberry C [Ascorbate] Hives    Strawberry Extract Hives

## 2023-11-08 NOTE — Provider Clarification Note (Signed)
 Patient Name: Landers, Prajapati.  Account #: [id number]   MR #: [id number]  Discharge Date: @DCDATE @          Thank you for responding!    Documentation Query sent by: Mahmud A. Phil  Date:  11/07/2023        PROVIDER RESPONSE Insert a query response from the list above or add free text:  Secondary hypercoagulable state secondary to [facility] fibrillation, atrial flutter]

## 2023-11-08 NOTE — OT Eval Note (Signed)
 Occupational Therapy Eval [name].        Post Acute Care Therapy Recommendations:     Discharge Recommendations:  Home with supervision    DME needs IF patient is discharging home: Front wheel walker, Shower chair    Therapy discharge recommendations may change with patient status.  Please refer to most recent note for up-to-date recommendations.      Assessment:   Significant Findings: None    Scott Monroe. is a 78 y.o. male admitted 11/04/2023.  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 Patient educated on heel WB R LE in post op shoe - verbalized understanding. Pt educated on transfer training, dressing techniques and home safety/setup/A/E. Pt limited by pain, decreased activity tolerance, impaired balance and generalized weakness. Pt would benefit from continued acute OT services to maximize functional independence with ADLs and mobility.      Therapy Diagnosis: decreased independence with ADLs and mobility    Rehabilitation Potential: good    Treatment Activities: OT evaluation     Educated the patient to role of occupational therapy, plan of care, goals of therapy and safety with mobility and ADLs, weight bearing precautions.    Plan:   OT Frequency Recommended: 2-3x/wk     Treatment/Interventions: ADLs, therex, theract, transfer training, functional mobility, balance, endurance, A/E, safety    Risks/benefits/POC discussed with patient       Unit: [facility] OR[facility] BLDG 5 WEST  Bed: FO576/FO576.01        Precautions and Contraindications:   Falls  R LE heel WB in post op shoe    Consult received for [name]. for OT Evaluation and Treatment.  Patient's medical condition is appropriate for Occupational Therapy intervention at this time.      History of Present Illness:    Scott Monroe. is a 78 y.o. 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 Per chart    Admitting  Diagnosis: Bacteremia [R78.81]    Past Medical/Surgical History:  Medical History[1]    Past Surgical History[2]        Imaging/Tests/Labs:  XR Foot Right AP Lateral And Oblique  Result Date: 11/06/2023  Partial amputation of the right hallux at the proximal phalangeal neck. [name], MD 11/06/2023 1:29 PM    US  Renal Kidney  Result Date: 11/06/2023   1.No hydronephrosis. 2.Mild asymmetric atrophy of the right kidney when compared to the left. No cortical thinning or increased echogenicity to suggest further evidence of chronic renal disease. 3.Prostatomegaly. [name], MD 11/06/2023 9:52 AM    XR Chest AP Portable  Result Date: 11/06/2023  No acute cardiopulmonary process.    [name], MD 11/06/2023 9:43 AM    US  Arterial/ Graft Duplex Dopp Low Extrem Bil Comp  Result Date: 11/02/2023  1.Bilateral ABI and TBI consistent with severe peripheral arterial disease. Severe multifocal atherosclerotic disease throughout the bilateral lower extremity arteries. 2.Right lower extremity: Focal occlusion in the mid/distal popliteal artery with distal reconstitution. Three-vessel runoff with weakly biphasic waveforms. 3.Left lower extremity: Pulses parvus and tardus waveforms in the proximal common femoral artery in setting of known common iliac artery stenosis. Occlusion of the superficial femoral artery with reconstitution of the popliteal artery. Three-vessel runoff with weakly monophasic waveforms. Findings above discussed with ED PA Reche Dadds via telephone at 6:43 PM on 11/02/2023 with verbal confirmation. Dhane F.  Stomp, MD 11/02/2023 6:43 PM    US  Noninvasive Low Extrem Art Dopp/Press/Wavefrms (Abi-Ppg) Ltd 1-2 Lvls  Result Date: 11/02/2023  1.Bilateral ABI and TBI consistent with severe peripheral arterial disease. Severe multifocal atherosclerotic disease throughout the bilateral lower extremity arteries. 2.Right lower extremity: Focal occlusion in the mid/distal popliteal artery with distal  reconstitution. Three-vessel runoff with weakly biphasic waveforms. 3.Left lower extremity: Pulses parvus and tardus waveforms in the proximal common femoral artery in setting of known common iliac artery stenosis. Occlusion of the superficial femoral artery with reconstitution of the popliteal artery. Three-vessel runoff with weakly monophasic waveforms. Findings above discussed with ED PA Reche Dadds via telephone at 6:43 PM on 11/02/2023 with verbal confirmation. [name], MD 11/02/2023 6:43 PM    Toes Right 2+ Vws  Result Date: 11/02/2023  1.Prominent erosive/destructive change with fragmentation in the distal tuft and shaft of the distal phalanx, right great toe, concerning for osteomyelitis and pathologic fracture. 2.Nonhealing wound in the dorsal aspect of the right great toe overlying the distal phalanx and at the region of the nailbed. [name], MD 11/02/2023 12:14 PM       Social History:   Prior Level of Function:   Prior level of function: Ambulates with assistive device, Independent with ADLs (was receiving HHOT/HHPT)  Assistive Device: Front wheel walker, Single point cane  Baseline Activity Level: (limited community)  Driving: does not drive  DME Currently at Home: Vannie, [employer], ADL- Paediatric Nurse, [place], Single [employer] Living Arrangements:  Living Arrangements: Children, Other (Comment) (ex-wife, daughter assists her and him)  Type of Home: House  Home Layout: One level (0STE)    Subjective: I will do anything.    Patient is agreeable to participation in the therapy session. Nursing clears patient for therapy.     Patient Goal: To get better    Pain:   Scale: 2-3/10 at rest, 8/10 post ambulation   Location: R foot  Intervention: positioned for comfort, RN notified     Objective:   Patient is in bed with dressings, foley and peripheral IV in place.    Pt wore mask during therapy session:No      Cognitive Status and Neuro Exam:  Alert  Follows directions     Musculoskeletal  Examination  RUE ROM: WFL  LUE ROM: WFL    RUE Strength: WFL  LUE Strength: WFL    Activities of Daily Living  Eating: ind hand to mouth  Grooming: setup seated  Bathing: NT  UE Dressing: setup  LE Dressing: SBA post op shoe  Toileting: foley     Functional Mobility:  Supine to Sit: SBA  Sit to Stand: SBA  Transfers: SBA/CGA with cane - discussed use of FWW. Patient requesting crutches - defer to PT    PMP Activity: Step 6 - Walks in Room      Balance  Static Sitting: good  Dynamic Sitting: fair +  Static Standing: fair +  Dynamic Standing: fair +    Participation and Activity Tolerance  Participation Effort: good  Endurance: fair +    Patient left with call bell within reach, all needs met, SCDs off as found, fall mat as found, bed alarm as found, and all questions answered. RN notified of session outcome and patient response.       Goals:  Time For Goal Achievement: 3 visits  ADL Goals  Patient will groom self: Supervision, at sinkside  Patient will dress lower body: Supervision  Patient will toilet: Supervision  Mobility and Transfer Goals  Pt will perform functional transfers: Supervision, with rolling walker           PPE worn during session: gloves    Tech present: No    Augustin Harl Slade OTR/L  Pager # [phone]     Time of Treatment:   OT Received On: 11/08/23  Start Time: 1012  Stop Time: 1040  Time Calculation (min): 28 min        [1]   Past Medical History:  Diagnosis Date    Hyperlipidemia     Hypertension     Insomnia     Irregular heart beat     Restless leg     Sleep apnea    [2]   Past Surgical History:  Procedure Laterality Date    BACK SURGERY      SPINAL CORD STIMULATOR IMPLANT

## 2023-11-08 NOTE — Progress Notes (Signed)
 H&P Update with ASA/MALLAMPATTI     Date Time: 11/08/23 5:53 PM    Procedure    RLE angiogram       MALLAMPATI AIRWAY CLASSIFICATION (III)      CLASS I- Full visibility of tonsils, uvula, and soft palate     CLASS II- Visibility of hard and soft palate, upper portion of tonsils and uvula  CLASS III-Soft and hard palate and base of uvula are visible    CLASS IV- Only hard palate visible      ASA PHYSICAL STATUS  (3)     ASA 1   HEALTHY PATIENT  ASA 2   MILD SYSTEMIC ILLNESS  ASA 3   SYSTEMIC DISEASE, NOT INCAPACITATING  ASA 4   SEVERE SYSTEMIC DISEASE, IS CONSTANT THREAT TO LIFE  ASA 5   MORIBUND CONDITION, NOT EXPECTED TO LIVE >24 HOURS           IRRESPECTIVE OF PROCEDURE  E           EMERGENCY PROCEDURE     CONCLUSION       The history and physical currently available in the chart has been reviewed and there are no significant interval changes since prior evaluation.  He has no complaints.  He was seen and examined by me prior to the procedure.  The risks, benefits and alternatives of the procedure have been discussed in detail and he has indicated that he understands the procedure, indications, and risks inherent to the procedure and is amenable to proceeding.  All questions were answered. Informed consent was signed and verified.          Signed by: Burns [name], PA      I have reviewed the notes, assessments, and/or procedures performed by Surgery PA,  [name], [name], [name], [name], MD, have performed a substantive portion of the encounter by performing the assessment and plan. I reviewed the note and agree with the documented findings and plan of care.      I helped formulate the assessment and plan as documented and agree with the documentation with the following recommendations Procedure and risks discussed he understands and consents to the same.     [name], MD

## 2023-11-08 NOTE — Plan of Care (Signed)
 Problem: Pain interferes with ability to perform ADL  Goal: Pain at adequate level as identified by patient  Outcome: Progressing  Flowsheets (Taken 11/07/2023 1139 by Glennda Searles, RN)  Pain at adequate level as identified by patient:   Identify patient comfort function goal   Assess pain on admission, during daily assessment and/or before any as needed intervention(s)   Reassess pain within 30-60 minutes of any procedure/intervention, per Pain Assessment, Intervention, Reassessment (AIR) Cycle   Assess for risk of opioid induced respiratory depression, including snoring/sleep apnea. Alert healthcare team of risk factors identified.   Evaluate if patient comfort function goal is met   Evaluate patient's satisfaction with pain management progress   Offer non-pharmacological pain management interventions   Consult/collaborate with Pain Service   Consult/collaborate with Physical Therapy, Occupational Therapy, and/or Speech Therapy   Include patient/patient care companion in decisions related to pain management as needed     Problem: Side Effects from Pain Analgesia  Goal: Patient will experience minimal side effects of analgesic therapy  Outcome: Progressing  Flowsheets (Taken 11/07/2023 1139 by Glennda Searles, RN)  Patient will experience minimal side effects of analgesic therapy:   Monitor/assess patient's respiratory status (RR depth, effort, breath sounds)   Assess for changes in cognitive function   Prevent/manage side effects per LIP orders (i.e. nausea, vomiting, pruritus, constipation, urinary retention, etc.)   Evaluate for opioid-induced sedation with appropriate assessment tool (i.e. POSS)     Problem: Moderate/High Fall Risk Score >5  Goal: Patient will remain free of falls  Outcome: Progressing  Flowsheets  Taken 11/08/2023 2000 by Toure Edmonds, RN  High (Greater than 13):   HIGH-Visual cue at entrance to patient's room   HIGH-Bed alarm on at all times while patient in bed   HIGH-Utilize chair pad  alarm for patient while in the chair   HIGH-Apply yellow Fall Risk arm band   HIGH-Pharmacy to initiate evaluation and intervention per protocol   HIGH-Initiate use of floor mats as appropriate   HIGH-Consider use of low bed  Taken 11/06/2023 2200 by Karry Sherrilyn Jenkins KATHEE, RN  Moderate Risk (6-13):   MOD-Consider activation of bed alarm if appropriate   MOD-Floor mat at bedside (where available) if appropriate   MOD-Remain with patient during toileting     Problem: Compromised Sensory Perception  Goal: Sensory Perception Interventions  Outcome: Progressing  Flowsheets (Taken 11/08/2023 2012)  Sensory Perception Interventions: Offload heels, Pad bony prominences, Reposition q 2hrs/turn Clock, Q2 hour skin assessment under devices if present     Problem: Compromised Activity/Mobility  Goal: Activity/Mobility Interventions  Outcome: Progressing  Flowsheets (Taken 11/08/2023 2012)  Activity/Mobility Interventions: Pad bony prominences, TAP Seated positioning system when OOB, Promote PMP, Reposition q 2 hrs / turn clock, Offload heels     Problem: Safety  Goal: Patient will be free from injury during hospitalization  Outcome: Progressing  Flowsheets (Taken 11/07/2023 0223 by Karry Sherrilyn Jenkins KATHEE, RN)  Patient will be free from injury during hospitalization:   Assess patient's risk for falls and implement fall prevention plan of care per policy   Provide and maintain safe environment   Ensure appropriate safety devices are available at the bedside   Include patient/ family/ care giver in decisions related to safety   Hourly rounding  Goal: Patient will be free from infection during hospitalization  Outcome: Progressing  Flowsheets (Taken 11/06/2023 0418 by Rudy Colbert BRAVO, RN)  Free from Infection during hospitalization:   Assess and monitor for signs and symptoms  of infection   Monitor all insertion sites (i.e. indwelling lines, tubes, urinary catheters, and drains)   Encourage patient and family to use good hand  hygiene technique   Monitor lab/diagnostic results     Problem: Day of Admission - Heart Failure  Goal: Heart Failure Admission  Outcome: Progressing  Flowsheets (Taken 11/06/2023 0912 by Gailen Combe, RN)  Heart Failure Admission:   Initiate education with patient and caregiver using CHF Warning Zones and Educational Videos ([employer] or [employer])   Oxygen as needed     Problem: Everyday - Heart Failure  Goal: Stable Vital Signs and Fluid Balance  Outcome: Progressing  Flowsheets (Taken 11/07/2023 0223 by Karry Sherrilyn Jenkins KATHEE, RN)  Stable Vital Signs and Fluid Balance:   Daily Standing Weights in the morning using the same scale, after using the bathroom and before breadfast.  If unable to stand, zero the bed and use the bed scale   Monitor, assess vital signs and telemetry per policy   Monitor labs and report abnormalities to physician   Strict Intake/Output   Fluid Restriction   Assess for swelling/edema  Goal: Mobility/Activity is Maintained at Optimal Level for Patient  Outcome: Progressing  Flowsheets (Taken 11/06/2023 0601 by Rudy Colbert BRAVO, RN)  Mobility/Activity is Maintained at Optimal Level for Patient:   Increase mobility as tolerated/progressive mobility protocol   Perform active/passive ROM   Assess for changes in respiratory status, level of consciousness and/or development of fatigue   Reposition patient every 2 hours and as needed unless able to reposition self   Maintain SCD's as Ordered   Consult with Physical Therapy and/or Occupational Therapy  Goal: Nutritional Intake is Adequate  Outcome: Progressing  Flowsheets (Taken 11/06/2023 0601 by Rudy Colbert BRAVO, RN)  Nutritional Intake is Adequate:   Cardiac diet-2 gm Sodium   Fluid Restricction if needed   Encourage/perform oral hygiene as appropriate   Patient and family teaching on low sodium diet   Consult/Collaborate with Nutritionist   Assess appetite,anorexia and amount of meal/food tolerated     Problem: Day of Discharge - Heart  Failure  Goal: Discharge Education  Outcome: Progressing  Flowsheets (Taken 11/06/2023 0601 by Rudy Colbert BRAVO, RN)  Day of Discharge:   After Visit Summary (Discharge Instuctions) with medications   Follow-up appointments   Low Sodium Diet   Fluid Restriction   CHF Warning Zones and when to call for help   Daily Standing Weights     Problem: Hemodynamic Status: Cardiac  Goal: Stable vital signs and fluid balance  Outcome: Progressing  Flowsheets (Taken 11/07/2023 0223 by Karry Sherrilyn Jenkins KATHEE, RN)  Stable vital signs and fluid balance:   Assess signs and symptoms associated with cardiac rhythm changes   Monitor lab values     Problem: Fluid and Electrolyte Imbalance/ Endocrine  Goal: Fluid and electrolyte balance are achieved/maintained  Outcome: Progressing  Flowsheets (Taken 11/07/2023 0223 by Karry Sherrilyn Jenkins KATHEE, RN)  Fluid and electrolyte balance are achieved/maintained:   Monitor/assess lab values and report abnormal values   Assess and reassess fluid and electrolyte status   Monitor for muscle weakness     Problem: Diabetes: Glucose Imbalance  Goal: Blood glucose stable at established goal  Outcome: Progressing  Flowsheets (Taken 11/07/2023 0223 by Karry Sherrilyn Jenkins KATHEE, RN)  Blood glucose stable at established goal:   Assess for hypoglycemia /hyperglycemia   Monitor/assess vital signs   Coordinate medication administration with meals, as indicated   Ensure appropriate diet and assess  tolerance   Ensure adequate hydration   Ensure appropriate consults are obtained (Nutrition, Diabetes Education, and Case Management)     Problem: Pain  Goal: Pain at adequate level as identified by patient  Outcome: Progressing  Flowsheets (Taken 11/07/2023 1139 by Glennda Searles, RN)  Pain at adequate level as identified by patient:   Identify patient comfort function goal   Assess pain on admission, during daily assessment and/or before any as needed intervention(s)   Reassess pain within 30-60 minutes of any  procedure/intervention, per Pain Assessment, Intervention, Reassessment (AIR) Cycle   Assess for risk of opioid induced respiratory depression, including snoring/sleep apnea. Alert healthcare team of risk factors identified.   Evaluate if patient comfort function goal is met   Evaluate patient's satisfaction with pain management progress   Offer non-pharmacological pain management interventions   Consult/collaborate with Pain Service   Consult/collaborate with Physical Therapy, Occupational Therapy, and/or Speech Therapy   Include patient/patient care companion in decisions related to pain management as needed     Problem: Every Day  Goal: Infection prevention (risk for infection)  Outcome: Progressing  Flowsheets (Taken 11/07/2023 0223 by Karry Sherrilyn Jenkins KATHEE, RN)  Infection prevention (risk for infection) Interventions:   Assess for signs/symptoms of infection.   Patient & family hand hygiene.

## 2023-11-08 NOTE — Progress Notes (Signed)
 [facility]  Internal Medicine Hospitalists  Progress Note        Assessment / Plan:        Scott Monroe. is a 78 y.o. male with diabetes, hypertension, hyperlipidemia, chronic HFpEF (grade 1) A-fib (on Eliquis ) with recent ED visit 11/02/2023 for right hallux osteomyelitis with overlying abscess who presented to the emergency room and was noted to have gram-negative bacteremia and UTI with + Ucx(now growing Klebsiella and E. coli).  Recent wound culture growing Pseudomonas and staph ludgenesis.    S/p partial R hallux amputation 10/22, pending angiogram 10/25. On IV abx      #Osteomyelitis of toe  - Wound Cx: Pseudomonas and staph lugdunesisi   - Status post right partial hallux amputation 10/22, 10/22wound cultures --> PsA  - IV antibiotics per infectious disease. [place] on PO  - Plan for angiogram 10/25 (resume Eliquis  after if cleared by vascular)  - Pain control with scheduled acetaminophen , as needed oxycodone .  Bowel regimen while on opiates    #Gram-negative bacteremia-  - Followed by ID most likely source CAUTI versus osteomyelitis   - F/u BCx 10/18. 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  #Bladder mass  #Urinary retention  #CAUTI - Kelbsiella/ecoli   - Continue with Zosyn per ID  - Continue with Foley catheter  - Urology recommending outpatient cystoscopy (has evaluated previously)  - Continue Flomax     #Hyperlipidemia  - Continue with atorvastatin     #Insomnia  - Trazodone     #Acute on chronic renal failure  - Has not been following with specialists (is going to need a transitional care appointment prior to leaving)  - Says he has been told he has issues with renal failure in the past  - Looks like his baseline is around 1.3, already improved  - Continue to monitor  - Hold valsartan   - Avoid nephrotoxins     #Diabetes  #Neuropathy  - Continue gabapentin   - Holding metformin   - Sliding scale  insulin     #Chronic HFpEF and grade 1 DD  # Hypertension  # CAD  # A-fib  - Continue with atorvastatin   - Metoprolol  25 twice daily  - Holding valsartan  in the setting of renal failure  - Continue with aspirin   - Holding Eliquis  for possible surgical intervention, CHA2DS2-VASc is 4 no need to bridge, start DVT prophylaxis. Resume Eliquis  after angiogram when cleared by vascular  - 10/23 add amlodipine     VTE Prophylaxis: apixaban  (ELIQUIS ) tablet    heparin  (porcine) injection 5,000 Units       Foley Catheter: Foley for an urinary retention    Venous Access: No Temporary Central Line Present    Medical Readiness for Discharge: Anticipated in 5+ Days    Open Handoff Activity in Sidebar    Current Facility-Administered Medications[1]  Infusion Meds[2]  PRN Medications[3]  Allergies[4]    Subjective:         Pain under reasonable control with oxycodone   No other acute symptoms          Objective:      Temp:  [98.1 F (36.7 C)-98.4 F (36.9 C)] 98.1 F (36.7 C)  Heart Rate:  [60-104] 88  Resp Rate:  [14-17] 17  BP: (117-161)/(68-95) 136/76     General: WD male in no acute distress.  HEENT: eomi, sclera anicteric, mucous membranes moist  Neck: no JVD noted  Cardiovascular: regular rhythm, normal rate  Lungs: normal WOB  Abdomen: Soft, NTND  Extremities: No LE edema, dry bandage over R foot  Neuro: AAOx3, no gross focal deficits                            [1]   Current Facility-Administered Medications   Medication Dose Route Frequency    acetaminophen   1,000 mg Oral TID    amLODIPine  10 mg Oral Daily    aspirin  EC  81 mg Oral Daily    atorvastatin   80 mg Oral Daily    gabapentin   600 mg Oral TID    heparin  (porcine)  5,000 Units Subcutaneous Q8H SCH    insulin  lispro  1-5 Units Subcutaneous TID AC    metoprolol  tartrate  25 mg Oral Q12H SCH    piperacillin-tazobactam  4.5 g Intravenous Q8H    tamsulosin   0.4 mg Oral Daily after dinner    traZODone   50 mg Oral QHS   [2]   Current Facility-Administered Medications    Medication Dose Route Frequency Last Rate   [3]   Current Facility-Administered Medications   Medication Dose Route    benzocaine-menthol  1 lozenge Buccal    benzonatate  100 mg Oral    carboxymethylcellulose sodium  1 drop Both Eyes    dextrose   15 g of glucose Oral    Or    dextrose   12.5 g Intravenous    Or    dextrose   12.5 g Intravenous    Or    glucagon  (rDNA)  1 mg Intramuscular    dextrose   15 g of glucose Oral    Or    dextrose   12.5 g Intravenous    Or    dextrose   12.5 g Intravenous    Or    glucagon  (rDNA)  1 mg Intramuscular    magnesium  sulfate  1 g Intravenous    melatonin  3 mg Oral    naloxone   0.2 mg Intravenous    oxyCODONE   10 mg Oral    oxyCODONE   5 mg Oral    potassium & sodium phosphates   2 packet Oral    potassium chloride   0-60 mEq Oral    Or    potassium chloride   0-60 mEq Oral    Or    potassium chloride   10 mEq Intravenous    saline  2 spray Each Nare   [4]   Allergies  Allergen Reactions    Strawberry C [Ascorbate] Hives    Strawberry Extract Hives

## 2023-11-09 ENCOUNTER — Encounter: Admission: EM | Disposition: A | Payer: Self-pay | Source: Home / Self Care

## 2023-11-09 DIAGNOSIS — I70235 Atherosclerosis of native arteries of right leg with ulceration of other part of foot: Secondary | ICD-10-CM

## 2023-11-09 DIAGNOSIS — M86071 Acute hematogenous osteomyelitis, right ankle and foot: Secondary | ICD-10-CM

## 2023-11-09 HISTORY — PX: ARTERIAL-  LOWER EXTREMITY ANGIOGRAPHY POSS PTA: VAS2057

## 2023-11-09 LAB — CULTURE AND GRAM STAIN, AEROBIC BACTERIA, WOUND/TISSUE/FLUID
Gram Stain: NONE SEEN
Gram Stain: NONE SEEN

## 2023-11-09 LAB — MICROBIOLOGY ADDITIONAL REQUEST

## 2023-11-09 LAB — APTT: PTT: 29 s (ref 27–39)

## 2023-11-09 LAB — LAB USE ONLY - CBC WITH DIFFERENTIAL
Absolute Basophils: 0.09 x10 3/uL — ABNORMAL HIGH (ref 0.00–0.08)
Absolute Eosinophils: 0.27 x10 3/uL (ref 0.00–0.44)
Absolute Immature Granulocytes: 0.04 x10 3/uL (ref 0.00–0.07)
Absolute Lymphocytes: 1.9 x10 3/uL (ref 0.42–3.22)
Absolute Monocytes: 0.6 x10 3/uL (ref 0.21–0.85)
Absolute Neutrophils: 5.42 x10 3/uL (ref 1.10–6.33)
Absolute nRBC: 0 x10 3/uL (ref <=0.00)
Basophils %: 1.1 %
Eosinophils %: 3.2 %
Hematocrit: 28.5 % — ABNORMAL LOW (ref 37.6–49.6)
Hemoglobin: 9.2 g/dL — ABNORMAL LOW (ref 12.5–17.1)
Immature Granulocytes %: 0.5 %
Lymphocytes %: 22.8 %
MCH: 27.9 pg (ref 25.1–33.5)
MCHC: 32.3 g/dL (ref 31.5–35.8)
MCV: 86.4 fL (ref 78.0–96.0)
MPV: 9.6 fL (ref 8.9–12.5)
Monocytes %: 7.2 %
Neutrophils %: 65.2 %
Platelet Count: 194 x10 3/uL (ref 142–346)
Preliminary Absolute Neutrophil Count: 5.42 x10 3/uL (ref 1.10–6.33)
RBC: 3.3 x10 6/uL — ABNORMAL LOW (ref 4.20–5.90)
RDW: 15 % (ref 11–15)
WBC: 8.32 x10 3/uL (ref 3.10–9.50)
nRBC %: 0 /100{WBCs} (ref <=0.0)

## 2023-11-09 LAB — CULTURE BLOOD AEROBIC AND ANAEROBIC: Culture Blood: NO GROWTH

## 2023-11-09 LAB — BASIC METABOLIC PANEL
Anion Gap: 7 (ref 5.0–15.0)
BUN: 29 mg/dL — ABNORMAL HIGH (ref 9–28)
CO2: 21 meq/L (ref 17–29)
Calcium: 8.9 mg/dL (ref 7.9–10.2)
Chloride: 106 meq/L (ref 99–111)
Creatinine: 1.6 mg/dL — ABNORMAL HIGH (ref 0.5–1.5)
GFR: 44.7 mL/min/1.73 m2 — ABNORMAL LOW (ref >=60.0)
Glucose: 181 mg/dL — ABNORMAL HIGH (ref 70–100)
Potassium: 5.2 meq/L (ref 3.5–5.3)
Sodium: 134 meq/L — ABNORMAL LOW (ref 135–145)

## 2023-11-09 LAB — WHOLE BLOOD GLUCOSE POCT
Whole Blood Glucose POCT: 166 mg/dL — ABNORMAL HIGH (ref 70–100)
Whole Blood Glucose POCT: 134 mg/dL — ABNORMAL HIGH (ref 70–100)
Whole Blood Glucose POCT: 151 mg/dL — ABNORMAL HIGH (ref 70–100)
Whole Blood Glucose POCT: 150 mg/dL — ABNORMAL HIGH (ref 70–100)
Whole Blood Glucose POCT: 219 mg/dL — ABNORMAL HIGH (ref 70–100)

## 2023-11-09 LAB — ANTI-XA,UFH: Anti-Xa, UFH: <0.04 [IU]/mL

## 2023-11-09 SURGERY — ARTERIOGRAM EXTREMITY LOWER
Anesthesia: Conscious Sedation | Laterality: Right

## 2023-11-09 MED ORDER — HEPARIN SODIUM (PORCINE) 5000 UNIT/ML IJ SOLN
50.0000 [IU]/kg | INTRAMUSCULAR | Status: DC | PRN
Start: 2023-11-09 — End: 2023-11-13
  Administered 2023-11-10: 2850 [IU] via INTRAVENOUS
  Filled 2023-11-09: qty 1

## 2023-11-09 MED ORDER — SODIUM CHLORIDE 0.9 % IV BOLUS
250.0000 mL | Freq: Once | INTRAVENOUS | Status: AC
Start: 2023-11-09 — End: 2023-11-09
  Administered 2023-11-09: 250 mL via INTRAVENOUS
  Filled 2023-11-09: qty 250

## 2023-11-09 MED ORDER — MIDAZOLAM HCL 1 MG/ML IJ SOLN (WRAP)
INTRAMUSCULAR | Status: AC | PRN
Start: 2023-11-09 — End: 2023-11-09
  Administered 2023-11-09: 1 mg via INTRAVENOUS

## 2023-11-09 MED ORDER — SODIUM CHLORIDE 0.9 % IV SOLN
INTRAVENOUS | Status: AC
Start: 2023-11-09 — End: 2023-11-09
  Filled 2023-11-09: qty 1000

## 2023-11-09 MED ORDER — CLOPIDOGREL BISULFATE 300 MG PO TABS
ORAL_TABLET | ORAL | Status: AC | PRN
Start: 2023-11-09 — End: 2023-11-09
  Administered 2023-11-09: 150 mg via ORAL

## 2023-11-09 MED ORDER — CLOPIDOGREL BISULFATE 75 MG PO TABS
75.0000 mg | ORAL_TABLET | Freq: Every day | ORAL | Status: DC
Start: 2023-11-10 — End: 2023-11-13
  Administered 2023-11-10 – 2023-11-12 (×3): 75 mg via ORAL
  Filled 2023-11-09 (×3): qty 1

## 2023-11-09 MED ORDER — LIDOCAINE HCL 1 % IJ SOLN
INTRAMUSCULAR | Status: AC
Start: 2023-11-09 — End: 2023-11-09
  Filled 2023-11-09: qty 10

## 2023-11-09 MED ORDER — CLOPIDOGREL BISULFATE 75 MG PO TABS
ORAL_TABLET | ORAL | Status: AC
Start: 2023-11-09 — End: 2023-11-09
  Filled 2023-11-09: qty 2

## 2023-11-09 MED ORDER — HEPARIN (PORCINE) IN NACL 1000-0.9 UT/500ML-% IV SOLN - TABLE FLUSH (SEDATION NARRATOR)
INTRAVENOUS | Status: AC | PRN
Start: 2023-11-09 — End: 2023-11-09
  Administered 2023-11-09: 1000 [IU]

## 2023-11-09 MED ORDER — IODIXANOL 320 MG/ML IV SOLN
INTRAVENOUS | Status: AC | PRN
Start: 2023-11-09 — End: 2023-11-09
  Administered 2023-11-09: 55 mL via INTRA_ARTERIAL

## 2023-11-09 MED ORDER — HEPARIN (PORCINE) IN D5W 50-5 UNIT/ML-% IV SOLN (UNITS/KG/HR ONLY)
12.0000 [IU]/kg/h | INTRAVENOUS | Status: DC
Start: 2023-11-09 — End: 2023-11-11
  Administered 2023-11-09: 12 [IU]/kg/h via INTRAVENOUS
  Administered 2023-11-10: 16 [IU]/kg/h via INTRAVENOUS
  Filled 2023-11-09 (×2): qty 500

## 2023-11-09 MED ORDER — HEPARIN SODIUM (PORCINE) 1000 UNIT/ML IJ SOLN (WRAP)
INTRAMUSCULAR | Status: AC | PRN
Start: 2023-11-09 — End: 2023-11-09
  Administered 2023-11-09: 5500 [IU] via INTRAVENOUS

## 2023-11-09 MED ORDER — FENTANYL CITRATE (PF) 50 MCG/ML IJ SOLN (WRAP)
INTRAMUSCULAR | Status: AC | PRN
Start: 2023-11-09 — End: 2023-11-09
  Administered 2023-11-09: 50 ug via INTRAVENOUS

## 2023-11-09 MED ORDER — HEPARIN SODIUM (PORCINE) 1000 UNIT/ML IJ SOLN
INTRAMUSCULAR | Status: AC
Start: 2023-11-09 — End: 2023-11-09
  Filled 2023-11-09: qty 10

## 2023-11-09 MED ORDER — HEPARIN (PORCINE) IN NACL 2-0.9 UNIT/ML-% IJ SOLN (WRAP)
INTRAVENOUS | Status: AC
Start: 2023-11-09 — End: 2023-11-09
  Filled 2023-11-09: qty 1500

## 2023-11-09 MED ORDER — LIDOCAINE HCL 1 % IJ SOLN
INTRAMUSCULAR | Status: AC | PRN
Start: 2023-11-09 — End: 2023-11-09
  Administered 2023-11-09: 10 mL via SUBCUTANEOUS

## 2023-11-09 MED ORDER — FENTANYL CITRATE (PF) 50 MCG/ML IJ SOLN (WRAP)
INTRAMUSCULAR | Status: AC
Start: 2023-11-09 — End: 2023-11-09
  Filled 2023-11-09: qty 2

## 2023-11-09 MED ORDER — MIDAZOLAM HCL 1 MG/ML IJ SOLN (WRAP)
INTRAMUSCULAR | Status: AC
Start: 2023-11-09 — End: 2023-11-09
  Filled 2023-11-09: qty 2

## 2023-11-09 SURGICAL SUPPLY — 11 items
CATHETER OD4 FR L65 CM 5 SIDEHOLE RADIOPAQUE BRAID FLUSH UNIV CURVE (Catheter Miscellaneous) IMPLANT
CATHETER ODSEC6 MM L135 CM MUSTANG BALLOON DILATATION L100 MM NYBAX (Balloons) IMPLANT
CATHETER ODSEC6 MM L135 CM MUSTANGâ„¢ BALLOON DILATATION L100 MM NYBAX (Balloons) ×1 IMPLANT
DEVICE CORDIS MYNXGRIP CLOSURE BALLOON CATHETER INTEGRATE SEALANT LOCK (Sealant) IMPLANT
GUIDEWIRE VASCULAR OD.035 IN L260 CM L3 CM GLIDEWIRE FLEXIBLE ANGLE (Guidewire) IMPLANT
GUIDEWIRE VASCULAR OD.035 IN L260 CM L4 CM STARTER ROSEN 1.5 MM RADIUS (Guidewire) IMPLANT
KIT MICROINTRODUCER L7 CM MAX COAXIAL STIFFEN GUIDEWIRE ECHOGENIC (Introducer) IMPLANT
SHEATH GUIDING L25 CM RADIOPAQUE DILATOR KINK RESISTANT SMOOTH (Introducer) IMPLANT
SHEATH INTRODUCER .035 IN L10 CM L2.5 CM ID5 FR SNAP ON DILATOR LOCK (Sheaths) IMPLANT
SHEATH INTRODUCER .035 IN L10 CM L2.5 CM ID7 FR SNAP ON DILATOR LOCK (Sheaths) IMPLANT
STENT ZILVER PTX VASCULAR OD6 MM L100 MM L125 CM (Stent) IMPLANT

## 2023-11-09 NOTE — Plan of Care (Signed)
 Shift Note:      Orientation:  A&O x4  Rhythm on tele:  Afib w/BBB  Oxygen:  RA  Ambulation:  Stdby assist w/cane or walker  Pain:  Denies any  Lines/Drips:  PIV 20G LFA; 20G Diff RFA;  LIH gtt-next AntiXa @ 04:00  GI/GU:  Chronic foley;  bowel stress incontinence   Fall Score:  Mod     Critical Labs/Imaging/Procedures:   - 10/18 wound cultures + PsA & staph lugdunsis  - 10/22 repeat wound Cx growing PsA  - 10/22 Right partial hallux amputation   - 10/25 Angio w/stent to right external iliac artery     Comments:  Patient arrived from cath lab in no acute distress.  Remains A&O x4 and in no pain.  Orientated the patient to his room & our unit.  He remains hemodynamically stable & free from falls or injury during this shift.     Plan:   - Q4hr NVC/vitals  - monitor R groin & R hallux amputation sites  - pain mgt  - IV abx  - Fem-Pop intervention next week  - per Urology OP cystoscopy   - LIH gtt - next Anti-Xa @ 04:00     Braden Score: 19    Brief hx:  78 y.o. male with PMH T2DM, HTN, HLD, Afib on Eliquis , smoker, presents for R hallux OM.   Admitted for IV antibiotics and further workup of bacteremia d/t UTI versus right hallux OM on 11/04/2023.  Now s/p R partial hallux amputation & angio w/stent to right external iliac artery.

## 2023-11-09 NOTE — Progress Notes (Signed)
 Foot & Ankle Surgery Progress Note  Spectra  [phone] / Pager [phone]    Date Time: 11/09/23 8:45 PM  Patient Name: Scott Monroe, Scott Monroe.  Consulting Physician: Rea Agent, [facility] Day: 6      Assessment:   Scott Venezia. is a 78 y.o. male with PMH T2DM (A1c 7.78 2 months ago), HTN, HLD, Afib on Eliquis , smoker, who presents for R hallux OM.  Admitted by medicine for IV antibiotics and further workup of bacteremia 2/2 UTI versus right hallux OM on 11/04/2023. Right hallux wound with seropurulent drainage and probe to bone concerning for osteomyelitis prior to amputation.     Operative Procedures:  10/18 Right hallux bedside I&D prior to discharge from ED  10/22 Right hallux bedside partial hallux amputation, closed primarily   10/25 Lower extremity angiography    Vitals  - Afebrile  - Tachy to 123     Labs  - No CBC  - BMP pending      Micro/path  - 10/18 Right hallux bedside wcx: MG Pseudomonas aeruginosa, LG Staphylococcus lugdunensis, MG GPC (final)  - 10/18 bcx: GNR (final)  -10/20 repeat bcx: NGTD (final)   - 10/22 IntraOp Right hallux cx: VLG Pseudomonas aeruginosa (final)  - 10/22 Right hallux Path: Pending    Imaging  - 10/18 Right hallux XR: Distal phalanx osteomyelitis   - 10/18 NIUS R ABI 0.34, TBI 0.14, L ABI 0.27 TBI 0.15     Plan:   - No further surgical plans and no barriers to discharge from podiatry perspective  - POD 4 s/p right partial hallux amputation closed primarily.  - Patient is s/p lower extremity angiography with Dr. Dia yesterday.  Right lower extremity dressing was changed following procedure.  -Vascular surgery plans repeat angiography with recanalization of SFA, pop, proximal tibial artery tomorrow (10/27).  Will plan to perform dressing change following procedure.  - Continue follow-up  pathology   - ABX per ID: continue Zosyn.  - Final ID recommendations for discharge is cephalexin and levofloxacin to complete his course of therapy.  - Heel Weight Bearing to  the right lower extremity  - DVT ppx per primary: Eliquis   - Wound Care: Xeroform, 4x4 gauze, Kerlix, and Webril.  Please keep dressings intact, dressing to be changed by podiatry following right lower extremity angiogram tomorrow 10/2 7  - Will discuss plan with attending, Dr. Rea Agent, DPM,  and continue to follow    Ozell Setting, DPM  Resident - Foot and Ankle Surgery   [facility] - PGY-1  [phone] [facility] On-Call Pager)   [phone] (Personal Pager)      Subjective:   Interval History:     Was seen at bedside today resting comfortably in bed. No unanticipated adverse events overnight.  Denies nausea, vomiting, fever, chills.    Review of Symptoms:   As per HPI, stated above.    Physical Exam:   BP 141/76   Pulse (!) 107   Temp 99.3 F (37.4 C) (Oral)   Resp 17   Ht 1.727 m (5' 7.99)   Wt 56.8 kg (125 lb 3.5 oz)   SpO2 96%   BMI 19.04 kg/m     Gen: NAD, alert and oriented x3  Heart: normal rate  Lung: non-labored, symmetrical chest rise, no audible wheezing  Abdomen: non-distended, soft and non-tender to palpation    Lower Extremity Exam:  Derm:  - Right: Dressing CDI.  No evidence of strikethrough  - Left: No  open lesions, no interdigital maceration, no clinical signs of infection       Vasc:  - Right: pulses nonpalpable, Dopplers monophasic  - Left: Pulses nonpalpable, Dopplers monophasic     Neuro:  - Right: Gross sensation intact. Gross motor function intact.  - Left: Gross sensation intact. Gross motor function intact.     MSK:  - Right: compartments soft, nontender, compressible. s/p partial hallux amputation  - Left: No gross deformities, compartments soft, nontender, compressible    Labs:     Recent Labs   Lab 11/09/23  0326 11/07/23  0358 11/06/23  0327 11/05/23  0438   WBC 8.32 8.08 7.80 7.73   RBC 3.30* 3.51* 3.54* 3.72*   Hemoglobin 9.2* 9.8* 9.8* 10.2*   Hematocrit 28.5* 29.8* 30.2* 31.6*   Glucose 181* 199* 163* 153*   BUN 29* 27 35* 30*   Creatinine 1.6* 1.5 1.8* 1.6*    Calcium  8.9 8.4 8.2 8.5   Sodium 134* 136 136 139   Potassium 5.2 4.9 5.0 4.7   Chloride 106 109 110 114*   CO2 21 21 21 20        Microbiology:     Recent Results (from the past 360 hours)   Culture, Urine    Collection Time: 11/02/23 12:14 PM    Specimen: Urine, Clean Catch   Result Value    Culture Urine >100,000 CFU/mL Klebsiella pneumoniae (A)    Culture Urine >100,000 CFU/mL Escherichia coli (A)       Susceptibility    Escherichia coli - MIC     Amoxicillin/Clavulanic acid <=4/2 Susceptible ug/mL     Ampicillin* <=4 Susceptible ug/mL      * If oral therapy is desired AND ampicillin is susceptible, consider amoxicillin. [place] Antimicrobial Subcommittee Nov. 2020     Ampicillin/Sulbactam 2/1 Susceptible ug/mL     Aztreonam <=2 Susceptible ug/mL     Cefazolin* <=1 Susceptible ug/mL      * Cefazolin interpretation is for uncomplicated UTI only. If oral therapy is desired for uncomplicated UTI AND E.coli is susceptible to cefazolin, consider cephalexin, cefdinir, cefpodoxime, or cefprozil. [place] Antimicrobial Subcommittee Nov. 2020     Cefepime <=1 Susceptible ug/mL     Cefoxitin <=4 Susceptible ug/mL     Ceftazidime <=2 Susceptible ug/mL     Ceftriaxone* <=1 Susceptible ug/mL      * Ceftriaxone does not predict cefdinir susceptibility. [place] Antimicrobial Subcommittee Nov. 2020     Cefuroxime 8 Intermediate ug/mL     Ciprofloxacin <=0.25 Susceptible ug/mL     Ertapenem <=0.25 Susceptible ug/mL     Gentamicin <=2 Susceptible ug/mL     Levofloxacin <=0.5 Susceptible ug/mL     Meropenem <=0.5 Susceptible ug/mL     Nitrofurantoin* <=16 Susceptible ug/mL      * Nitrofurantoin should only be used for the treatment of uncomplicated cystitis. [place] [facility] Antimicrobial Subcommittee June 2015.     Piperacillin/Tazobactam <=2/4 Susceptible ug/mL     Tetracycline* <=2 Susceptible ug/mL      * Enterobacterales susceptible to tetracycline are also considered susceptible to doxycycline and minocycline. CLSI M100-ED33:2023      Trimethoprim/Sulfamethoxazole <=0.5/9.5 Susceptible ug/mL    Klebsiella pneumoniae - MIC     Amoxicillin/Clavulanic acid <=4/2 Susceptible ug/mL     Ampicillin* >16 Resistant ug/mL      * If oral therapy is desired AND ampicillin is susceptible, consider amoxicillin. [place] Antimicrobial Subcommittee Nov. 2020     Ampicillin/Sulbactam 8/4 Susceptible ug/mL     Aztreonam <=  2 Susceptible ug/mL     Cefazolin* <=1 Susceptible ug/mL      * Cefazolin interpretation is for uncomplicated UTI only. If oral therapy is desired for uncomplicated UTI AND K. pneumoniae is susceptible to cefazolin, consider cephalexin, cefdinir, cefpodoxime, or cefprozil. [place] Antimicrobial Subcommittee Nov. 2020     Cefepime <=1 Susceptible ug/mL     Cefoxitin <=4 Susceptible ug/mL     Ceftazidime <=2 Susceptible ug/mL     Ceftriaxone* <=1 Susceptible ug/mL      * Ceftriaxone does not predict cefdinir susceptibility. [place] Antimicrobial Subcommittee Nov. 2020     Cefuroxime <=4 Susceptible ug/mL     Ciprofloxacin <=0.25 Susceptible ug/mL     Ertapenem <=0.25 Susceptible ug/mL     Gentamicin <=2 Susceptible ug/mL     Levofloxacin <=0.5 Susceptible ug/mL     Meropenem <=0.5 Susceptible ug/mL     Nitrofurantoin* 64 Intermediate ug/mL      * Nitrofurantoin should only be used for the treatment of uncomplicated cystitis. [place] [facility] Antimicrobial Subcommittee June 2015.     Piperacillin/Tazobactam 4/4 Susceptible ug/mL     Tetracycline* <=2 Susceptible ug/mL      * Enterobacterales susceptible to tetracycline are also considered susceptible to doxycycline and minocycline. CLSI M100-ED33:2023     Trimethoprim/Sulfamethoxazole <=0.5/9.5 Susceptible ug/mL   Culture, Blood, Aerobic And Anaerobic    Collection Time: 11/02/23  1:15 PM    Specimen: Blood, Venous   Result Value    Culture Blood No growth at 5 days   Culture, Blood, Aerobic And Anaerobic    Collection Time: 11/02/23  1:31 PM    Specimen: Blood, Venous   Result Value    Culture Blood Growth of  Gram negative rod (A)    Gram Stain Aerobic bottle positive for Gram negative rods (AA)   Culture, Acid Fast Bacilli (AFB/Mycobacteria)(Component)    Collection Time: 11/02/23  2:21 PM    Specimen: Toe, Right; Swab   Result Value    Culture Acid Fast Bacillus (AFB) No growth at 1 week   Culture And Gram Stain, Aerobic Bacteria, Wound/Tissue/Fluid    Collection Time: 11/02/23  2:21 PM    Specimen: Toe, Right; Swab   Result Value    Culture Aerobic Bacteria Moderate growth of Pseudomonas aeruginosa (A)    Culture Aerobic Bacteria Moderate growth of Pseudomonas aeruginosa (A)    Culture Aerobic Bacteria Light growth of Staphylococcus lugdunensis (A)    Gram Stain Few WBCs (A)    Gram Stain No epithelial cells (A)    Gram Stain Moderate Gram positive cocci (A)       Susceptibility    Pseudomonas aeruginosa - MIC     Aztreonam 4 Susceptible ug/mL     Cefepime 2 Susceptible ug/mL     Ceftazidime <=2 Susceptible ug/mL     Ciprofloxacin <=0.25 Susceptible ug/mL     Levofloxacin <=0.5 Susceptible ug/mL     Meropenem <=0.5 Susceptible ug/mL     Piperacillin/Tazobactam 4/4 Susceptible ug/mL     Tobramycin <=2 Susceptible ug/mL    Pseudomonas aeruginosa - MIC     Aztreonam 8 Susceptible ug/mL     Cefepime 2 Susceptible ug/mL     Ceftazidime <=2 Susceptible ug/mL     Ciprofloxacin <=0.25 Susceptible ug/mL     Levofloxacin <=0.5 Susceptible ug/mL     Meropenem <=0.5 Susceptible ug/mL     Piperacillin/Tazobactam 4/4 Susceptible ug/mL     Tobramycin <=2 Susceptible ug/mL   Culture, Fungal (Component)    Collection Time:  11/02/23  2:21 PM    Specimen: Toe, Right; Swab   Result Value    Culture Fungal No growth at 1 week   Fungal Stain (Component)    Collection Time: 11/02/23  2:21 PM    Specimen: Toe, Right; Swab   Result Value    Fungal Stain No fungal or yeast elements seen   Acid Fast Bacilli Stain (Component)    Collection Time: 11/02/23  2:21 PM    Specimen: Toe, Right; Swab   Result Value    Acid Fast Bacilli Stain No acid  fast bacilli seen   Culture, Blood, Aerobic And Anaerobic    Collection Time: 11/04/23 11:01 AM    Specimen: Blood, Venous   Result Value    Culture Blood No growth at 5 days   Nares, MRSA (methicillin-resistant Staphylococcus aureus) Screening, PCR    Collection Time: 11/04/23  9:40 PM    Specimen: Nares; Swab   Result Value    MRSA (methicillin resistant Staphylococcus aureus) DNA Not Detected   Culture And Gram Stain, Aerobic Bacteria, Wound/Tissue/Fluid    Collection Time: 11/06/23 12:05 PM    Specimen: Foot, Right; Swab   Result Value    Culture Aerobic Bacteria Very light growth of Pseudomonas aeruginosa (A)    Gram Stain No squamous epithelial cells seen    Gram Stain Few WBCs    Gram Stain No organisms seen   Fungal Stain (Component)    Collection Time: 11/06/23 12:05 PM    Specimen: Foot, Right; Swab   Result Value    Fungal Stain No fungal or yeast elements seen   Acid Fast Bacilli Stain (Component)    Collection Time: 11/06/23 12:05 PM    Specimen: Foot, Right; Swab   Result Value    Acid Fast Bacilli Stain No acid fast bacilli seen   Microbiology Additional Request    Collection Time: 11/09/23  9:32 AM    Specimen: Debridement Tissue (Specify if applicable); Abscess   Result Value    Microbiology Additional Request For results, see separate report       Radiology:   No results found.

## 2023-11-09 NOTE — Plan of Care (Incomplete)
Shift Note:      Orientation: AOx   Rhythm on tele:   Oxygen:   Ambulation:   Pain:   Lines/Drips:   GI/GU:   Fall Score:     Critical Labs/Imaging/Procedures: ***    Comments: ***   - see doc flow for complete assessment and vitals.     Significant Shift Events and Questions for Attending:    Plan:   - Vitals Q4H ***      (Braden Score: ?18 include skin assessment template)

## 2023-11-09 NOTE — Progress Notes (Signed)
 PROGRESS NOTE    Date Time: 11/09/23 11:44 AM  Patient Name: Scott [facility].                                  NEPHROLOGY PROGRESS NOTE    Follow up for AKI  Subjective:   No complaints    Medications:      Scheduled Meds: PRN Meds:    acetaminophen , 1,000 mg, Oral, TID  amLODIPine, 10 mg, Oral, Daily  atorvastatin , 80 mg, Oral, Daily  [START ON 11/10/2023] clopidogrel, 75 mg, Oral, Daily  gabapentin , 600 mg, Oral, TID  heparin  (porcine), 5,000 Units, Subcutaneous, Q8H [facility]  insulin  lispro, 1-5 Units, Subcutaneous, TID AC  metoprolol  tartrate, 25 mg, Oral, Q12H [facility]  piperacillin-tazobactam, 4.5 g, Intravenous, Q8H  tamsulosin , 0.4 mg, Oral, Daily after dinner  traZODone , 50 mg, Oral, QHS        Continuous Infusions:   sodium chloride  50 mL/hr at 11/09/23 0941    benzocaine-menthol, 1 lozenge, Q2H PRN  benzonatate, 100 mg, TID PRN  carboxymethylcellulose sodium, 1 drop, TID PRN  dextrose , 15 g of glucose, PRN   Or  dextrose , 12.5 g, PRN   Or  dextrose , 12.5 g, PRN   Or  glucagon  (rDNA), 1 mg, PRN  dextrose , 15 g of glucose, PRN   Or  dextrose , 12.5 g, PRN   Or  dextrose , 12.5 g, PRN   Or  glucagon  (rDNA), 1 mg, PRN  magnesium  sulfate, 1 g, PRN  melatonin, 3 mg, QHS PRN  naloxone , 0.2 mg, PRN  oxyCODONE , 10 mg, Q6H PRN  oxyCODONE , 5 mg, Q6H PRN  potassium & sodium phosphates , 2 packet, PRN  potassium chloride , 0-60 mEq, PRN   Or  potassium chloride , 0-60 mEq, PRN   Or  potassium chloride , 10 mEq, PRN  saline, 2 spray, Q4H PRN          Review of Systems:    No complaints of chest pain, palpitations shortness of breath, cough, headaches, dizziness, joint pain, abdominal pain, nausea, decreased appetite, or fevers.     Physical Exam:   Patient Vitals for the past 24 hrs:   BP Temp Temp src Pulse Resp SpO2 Weight   11/09/23 0941 155/74 97.8 F (36.6 C) Oral 93 18 98 % --   11/09/23 0714 139/75 98.2 F (36.8 C) Oral 80 17 100 % --   11/09/23 0617 -- -- -- -- -- -- 56.8 kg (125 lb 3.5 oz)   11/09/23 0322 125/74 97.9  F (36.6 C) Oral 76 17 97 % --   11/08/23 2249 129/76 98.2 F (36.8 C) Oral 73 16 97 % --   11/08/23 2103 160/89 -- -- 95 -- 99 % --   11/08/23 2103 160/89 -- -- 88 -- -- --   11/08/23 2015 -- -- -- -- -- -- 56.8 kg (125 lb 3.5 oz)   11/08/23 1913 143/79 98.1 F (36.7 C) Oral 85 16 99 % --   11/08/23 1518 123/66 98.4 F (36.9 C) Oral 68 18 99 % --       Intake and Output Summary (Last 24 hours) at Date Time    Intake/Output Summary (Last 24 hours) at 11/09/2023 1144  Last data filed at 11/09/2023 0900  Gross per 24 hour   Intake 550 ml   Output 2225 ml   Net -1675 ml       General appearance - alert, well appearing,  and in no distress  Mental status - alert, oriented to person, place, and time  Chest - clear to auscultation, no wheezes, rales or rhonchi, symmetric air entry  Heart - S1 and S2 normal  Neurological - alert, normal speech  Extremities - no pedal edema noted    Labs:     Results for orders placed or performed during the hospital encounter of 11/04/23 (from the past 24 hours)    Collection Time: 11/08/23  4:30 PM   Result Value    Whole Blood Glucose POCT 223 (H)   Basic Metabolic Panel    Collection Time: 11/09/23  3:26 AM   Result Value    Glucose 181 (H)    BUN 29 (H)    Creatinine 1.6 (H)    Calcium  8.9    Sodium 134 (L)    Potassium 5.2    Chloride 106    CO2 21    Anion Gap 7.0    GFR 44.7 (L)   CBC with Differential (Component)    Collection Time: 11/09/23  3:26 AM   Result Value    WBC 8.32    Hemoglobin 9.2 (L)    Hematocrit 28.5 (L)    Platelet Count 194    MPV 9.6    RBC 3.30 (L)    MCV 86.4    MCH 27.9    MCHC 32.3    RDW 15    nRBC % 0.0    Absolute nRBC 0.00    Preliminary Absolute Neutrophil Count 5.42    Neutrophils % 65.2    Lymphocytes % 22.8    Monocytes % 7.2    Eosinophils % 3.2    Basophils % 1.1    Immature Granulocytes % 0.5    Absolute Neutrophils 5.42    Absolute Lymphocytes 1.90    Absolute Monocytes 0.60    Absolute Eosinophils 0.27    Absolute Basophils 0.09 (H)     Absolute Immature Granulocytes 0.04    Collection Time: 11/09/23  7:17 AM   Result Value    Whole Blood Glucose POCT 166 (H)    Collection Time: 11/09/23  9:41 AM   Result Value    Whole Blood Glucose POCT 134 (H)           Recent Labs   Lab 11/09/23  0326 11/07/23  0358 11/06/23  0327   WBC 8.32 8.08 7.80   RBC 3.30* 3.51* 3.54*   Hemoglobin 9.2* 9.8* 9.8*   Hematocrit 28.5* 29.8* 30.2*   MCV 86.4 84.9 85.3   MCHC 32.3 32.9 32.5   RDW 15 15 15    MPV 9.6 9.8 9.5   Platelet Count 194 200 203         Recent Labs   Lab 11/09/23  0326 11/07/23  0358 11/06/23  0327 11/05/23  0438 11/04/23  1756   Sodium 134* 136 136 139 141   Potassium 5.2 4.9 5.0 4.7 4.8   Chloride 106 109 110 114* 113*   CO2 21 21 21 20 19    BUN 29* 27 35* 30* 32*   Creatinine 1.6* 1.5 1.8* 1.6* 1.8*   Glucose 181* 199* 163* 153* 167*   Calcium  8.9 8.4 8.2 8.5 9.1           Rads:   Radiological Procedure reviewed.    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 S/p IV contrast today. IV fluids started  post catheterization.     2) Hypertension: Adequate control with norvasc, lopressor       Plan:   Continue IV fluids for 6 hours post procedure  Maintain BP less than 150/80  Monitor renal function      Sahvannah Rieser Geraldine, MD  [facility]  Epic Chat: [facility], or [facility]  Spectra # [phone](after 430 PM, call the office number)  Pg# [phone]  Office # [phone]  After 430 PM, please call the office for person on call  On Weekends, call the office for the person on call

## 2023-11-09 NOTE — Nursing Progress Note (Signed)
 Brief background:   78 y.o. male with PMH T2DM (A1c 7.78 2 months ago), HTN, HLD, Afib on Eliquis , smoker, who presents for R hallux OM. Admitted by medicine for IV antibiotics and further workup of bacteremia 2/2 UTI versus right hallux OM on 11/04/2023.      Last 24 Hrs:   Patient reported 10/10 throbbing pain on surgical site, PRN oxycodone  given.  Refused 0.9% NaCl infusion.  Kept on NPO for RLE angiogram today.  No acute events overnight.  0.9% NaCl 250 mL bolus started.     Abnormal labs/VS:     Neuro changes: A&O x 4     Respiratory: Room air     Tele/Rhythm:  None     GI/GU: Foley cathter Fr 16, BM: 11/06/2023     Nutrition: NPO     Weight trend: None     Activity/ Safety/mobility: Independent (right foot heel weight bearing)     LDA access, line removal:   PIV 20 G Left Forearm     Skin/Wounds: Right partial hallux amputation     VTE Prophylaxis: Heparin  (porcine) injection 5,000 units     Upcoming procedures: RLE angiogram 10/25    Discharge date/plans: TBD

## 2023-11-09 NOTE — Op Note (Signed)
 FULL OPERATIVE NOTE    Date Time: 11/09/23 9:08 AM  Patient Name: Scott Monroe, Scott Monroe (MRN: [id number])  Attending Physician: Eve Maryjane BIRCH, DO      Date of Operation:   11/09/2023    Providers Performing:   Surgeons and Role:     DEWAINE Ovens, Vibha Ferdig, MD - Primary    Surgical First Assistant(s):   None    Operative Procedure:   Arterial-  Lower Extremity Angiography Poss PTA: 75710 (CPT)  75716 (CPT)      Preoperative Diagnosis:   Pre-Op Diagnosis Codes:      * PAD (peripheral artery disease) [I73.9]    Postoperative Diagnosis:   Post-Op Diagnosis Codes:     * PAD (peripheral artery disease) [I73.9]    Anesthesia:   No value filed.    Findings:   Severe tubular stenosis right external iliac artery with greater than 90% luminal compromise, multiple high-grade stenoses right superficial femoral artery proximal, mid and distal.  Total occlusion P2 segment of the right popliteal artery with severe stenosis at the origin of the anterior tibial, posterior tibial and short focal occlusion at the origin of the peroneal artery.  All 3 tibial vessels patent beyond the stenoses with continuous flow to the foot    Indications:   Nonhealing wound right foot patient at risk for limb loss    Operative Notes:   Patient was positioned on the angiogram table both groins were prepped and draped in standard fashion.  Timeout was called.  1% Xylocaine was copiously infiltrated into the skin and subcutaneous tissue over the right common femoral artery which was accessed with micropuncture needle.  018 wire was advanced proximally into the right iliofemoral vessels over which the dilator and sheath was placed    An angiogram was obtained through the sheath of the micropuncture set in order to ascertain the degree of iliac artery compromise which was known.    Along tubular stenosis of the right external iliac artery with greater than 90% luminal compromise was noted over the entire length of the right external iliac artery.   The right common and internal iliac arteries were patent and the distal abdominal aorta was patent.    At this point a Glidewire was introduced and the disease in the external iliac artery was crossed and the wire advanced into the infrarenal abdominal aorta.    A 5 French sheath was placed via the right common femoral artery.    4 French UF catheter was advanced into the abdominal aorta positioned above the origin of the renal arteries and aortography was performed.  Next the UF catheter was withdrawn into the distal abdominal aorta and magnified oblique views of the iliac arteries obtained.    Findings the abdominal aorta is widely patent.  Stenosis in the proximal right renal artery is noted.  The left renal artery is patent.  The infrarenal aorta is patent.  The aortic bifurcation vessels are patent.  Modest disease is noted in both common iliac arteries however no significant stenosis is noted in the common iliac arteries.  The internal iliac arteries are patent bilaterally with stenosis at the origin of the right internal iliac artery.  Total occlusion of the left external iliac artery is noted and severe stenosis of the right external iliac artery is noted.  This is a long tubular stenosis of the external iliac artery with greater than 90% luminal compromise.  The right common femoral artery is patent.  In the right profunda femoris  and proximal superficial femoral artery is patent as well.    We now exchanged the 5 French sheath in the right groin for 7 French long sheath and heparinized the patient with IV heparin  100 units/kg body weight.    We measured the size of the common iliac artery and the right common femoral artery and the length of occlusion of the right external iliac artery.    We first performed balloon angioplasty of the right external iliac artery using a 6 mm x 100 mm Mustang balloon.  Balloon was inflated to profile and excellent luminal gain was obtained.    We next proceeded to stent the  occlusion of the right external iliac artery using a 6 mm x 100 mm Zilver PTX drug-coated stent from [place].  This was then postdilated with a 6 mm balloon.  Angiography demonstrated excellent result with reconstitution of a robust normal-appearing external iliac artery without compromising the origin of the internal iliac artery.  The distal end of the stent extended into the common femoral.    We next performed angiography of the right lower extremity.    Findings the right common femoral profunda femoris arteries were widely patent.  Right superficial femoral artery is patent but multiple high-grade stenoses in the proximal mid and distal superficial femoral artery was noted.  P1 segment of the popliteal artery is patent however P2 segment of the popliteal artery is totally occluded.  There is reconstitution of the distal popliteal with three-vessel tibial runoff vessels patent however high-grade stenosis noted at the origin of the anterior tibial as well as origin of the posterior tibial artery.  Focal occlusion at the origin of the peroneal artery was noted.  The remainder of the 3 tibial vessels are widely patent with continuous flow into the foot.    The pressure in the right common femoral artery was higher than the systemic pressure on conclusion of the inflow correction.    Access was closed with a 6 7 Mynx device without any problems.    Total amount of contrast given was 55 cc of Visipaque fluoroscopy time was 8.8 minutes cumulative air kerma was 123.54 mGy dap was 43.2 Gray per centimeter squared.    We administered Versed and fentanyl during the case and monitored his oxygenation and vital signs.  He tolerated the case well total case time was about 90 minutes.  Patient was given 150 mg of Plavix as a loading dose on conclusion of the procedure.    My plan is to bring him back another day for an antegrade approach to try and recanalize the occluded popliteal artery and address the stenoses in the  superficial femoral artery as well as in the proximal tibial vessels.  This will give him optimal flow to the foot.              Estimated Blood Loss:   * No values recorded between 11/09/2023  7:58 AM and 11/09/2023  9:08 AM *    Implants:     Implant Name Type Inv. Item Serial No. Model No. Manufacturer Lot No. LRB No. Used Action   STENT ZILVER PTX VASCULAR OD6 MM L100 MM L125 CM - [id number] Stent STENT ZILVER PTX VASCULAR OD6 MM L100 MM L125 CM  [id number] [facility] [id number]  1 Implanted   DEVICE CORDIS MYNXGRIP CLOSURE BALLOON CATHETER INTEGRATE SEALANT LOCK - [id number] Sealant DEVICE CORDIS [facility] CLOSURE BALLOON CATHETER INTEGRATE SEALANT LOCK  [id number] [employer] [id number]  1 Implanted  Drains:   Drains: no      Specimens:   * No specimens in log *      Complications:   None    Signed by: Janyce Ovens, MD  [facility] CARDI[facility] CATH

## 2023-11-09 NOTE — Progress Notes (Cosign Needed)
 Team 2: Vascular Surgery Post Operative Note   [id number]      Date Time: 11/09/23 2:32 PM  Patient Name: Scott Monroe.  Attending Vascular Physician: Marinus Longest, MD   Hospital Day: 6    POD # 0  Date of Surgery: 11/09/23   Procedure: Procedure(s) (LRB):  Arterial-  Lower Extremity Angiography Poss PTA (Right)   Surgeon: Marinus Longest, MD     Subjective       Doing well post op, no groin pain, no leg pain   Monophasic DP/PT signals, right groin covered with dressing, so strikethrough, c/d/I  Getting post procedure hydration right now     Assessment    Karman Veney. is a 78 y.o.  male who presents with PAD who is POD 0 s/p RLE angiogram with R EIA stent     Plan   Plavix 150mg  given intra-op, will start Plavix 75mg  daily tomorrow  Starting hep gtt 4 hours post op, no initial bolus  Angiogram Monday for popliteal artery recanalization, R. SFA and proximal tibial recanalization  Remainder of care per primary     Labs       Hematology   Recent Labs     11/09/23  0326 11/07/23  0358   WBC 8.32 8.08   Hemoglobin 9.2* 9.8*   Hematocrit 28.5* 29.8*   Platelet Count 194 200        Coagulation   No results for input(s): PT, INR, PTT in the last 72 hours.    Chemistry   Recent Labs     11/09/23  0326 11/07/23  0358   Sodium 134* 136   Potassium 5.2 4.9   Chloride 106 109   CO2 21 21   BUN 29* 27   Creatinine 1.6* 1.5   Glucose 181* 199*   Calcium  8.9 8.4        Liver Function Tests   No results for input(s): AST, ALT, ALKPHOS, PROT, ALB, BILITOTAL, BILIDIRECT, LIP, AMY, PREALB in the last 72 hours.        Medications    Current Facility-Administered Medications[1]   sodium chloride  50 mL/hr at 11/09/23 0941    heparin  infusion 25,000 units/500 mL (Cardiac/Low Intensity)       benzocaine-menthol, benzonatate, carboxymethylcellulose sodium, dextrose  **OR** dextrose  **OR** dextrose  **OR** glucagon  (rDNA), dextrose  **OR** dextrose  **OR** dextrose  **OR** glucagon   (rDNA), heparin  (porcine), magnesium  sulfate, melatonin, naloxone , oxyCODONE , oxyCODONE , potassium & sodium phosphates , potassium chloride  **OR** potassium chloride  **OR** potassium chloride , saline     Input/Output     Patient Lines/Drains/Airways Status       Active Lines, Drains and Airways       Name Placement date Placement time Site Days    Peripheral IV 11/06/23 Left Forearm 11/06/23  1709  Forearm  2    Urethral Catheter Non-latex;Straight-tip 16 Fr. 11/02/23  1112  Non-latex;Straight-tip  7                      Physical Exam   Current Vitals:   Vitals:    11/09/23 1221   BP: 139/88   Pulse:    Resp:    Temp:    SpO2:        Gen: NAD  Neuro: A+O x 3, speech clear and congruent, face symmetric, tongue midline, EOMI, PERRL    Cardio: RRR   Pulm: unlabored breathing  Abd: soft, ND, NTTP     Ext:   Motor: intact  Sensory: intact     Pulse:   RLE: palpable fem, monophasic DP/PT  Right groin access site c/d/I, groin soft, no e/o hematoma                 [1]   Current Facility-Administered Medications   Medication Dose Route Frequency    acetaminophen   1,000 mg Oral TID    amLODIPine  10 mg Oral Daily    atorvastatin   80 mg Oral Daily    [START ON 11/10/2023] clopidogrel  75 mg Oral Daily    gabapentin   600 mg Oral TID    insulin  lispro  1-5 Units Subcutaneous TID AC    metoprolol  tartrate  25 mg Oral Q12H SCH    piperacillin-tazobactam  4.5 g Intravenous Q8H    tamsulosin   0.4 mg Oral Daily after dinner    traZODone   50 mg Oral QHS

## 2023-11-09 NOTE — Progress Notes (Signed)
 [facility]  Internal Medicine Hospitalists  Progress Note        Assessment / Plan:        Scott Hersh. is a 78 y.o. male with diabetes, hypertension, hyperlipidemia, chronic HFpEF (grade 1) A-fib (on Eliquis ) with recent ED visit 11/02/2023 for right hallux osteomyelitis with overlying abscess who presented to the emergency room and was noted to have gram-negative bacteremia and UTI with + Ucx(now growing Klebsiella and E. coli).  Recent wound culture growing Pseudomonas and staph ludgenesis.    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 S/p angiogram 10/25. On IV abx      #Osteomyelitis of toe  - Wound Cx: Pseudomonas and staph lugdunesisi   - Status post right partial hallux amputation 10/22, 10/18 wound cultures --> PsA and Staph lugdunsis; repeat Wound Cx 10/22 PsA growing   - Plan for angiogram 10/25 (resume Eliquis  after if cleared by vascular)  - Pain control with scheduled acetaminophen , as needed oxycodone .  Bowel regimen while on opiates    #Gram-negative bacteremia-  - Followed by ID most likely source CAUTI versus osteomyelitis  - F/u BCx 10/18. 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   -s/p angiogram 10/25: Dr. 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 Total occlusion P2 segment of the right popliteal artery with severe stenosis at the origin of the anterior tibial, posterior tibial and short focal occlusion at the origin of the peroneal artery. All 3 tibial vessels patent  beyond the stenoses with continuous flow to the foot   -plan is to bring him back another day for an antegrade approach to try and recanalize the occluded popliteal artery and address the stenoses in the superficial femoral artery as well as in the proximal tibial vessels. This will give him optimal flow to the foot.    #Hyperlipidemia  - Continue with atorvastatin     #Insomnia  - Trazodone     #Acute on chronic renal failure  - Has not been following with specialists (is going to need a transitional care appointment prior to leaving)  - Says he has been told he has issues with renal failure in the past  - Looks like his baseline is around 1.3, already improved  - Continue to monitor  - Hold valsartan   -Gentle IVF post angiogram procedure today  - Avoid nephrotoxins     #Diabetes  -Most recent A1C 7.3% (11/04/2023)  -BG trend acceptable in hospital (range 130-190)    #Neuropathy  - Continue gabapentin  600 mg po TID  - Holding metformin   - Sliding scale insulin     #Chronic HFpEF and grade 1 DD -compensated currently. Monitor closely with IVF given for AKI post procedure   # Hypertension - controlled  # CAD  # A-fib  - Continue with atorvastatin   - Metoprolol  25 twice daily  - Holding valsartan  in the setting of renal failure  - Continue with aspirin   - Holding Eliquis  for possible surgical intervention, CHA2DS2-VASc is 4  no need to bridge, start DVT prophylaxis. Resume Eliquis  after angiogram when cleared by vascular  - 10/23 add amlodipine     VTE Prophylaxis: apixaban  (ELIQUIS ) tablet    heparin  (porcine) injection 5,000 Units       Foley Catheter: Foley for an urinary retention    Venous Access: No Temporary Central Line Present    Medical Readiness for Discharge: Anticipated in 5+ Days    Open Handoff Activity in Sidebar    Current Facility-Administered Medications[1]  Infusion Meds[2]  PRN Medications[3]  Allergies[4]    Subjective:      Patient seen and examined and ISHV post angiogram-on room air laying flat  postprocedure.  No complaints of chest pain shortness of breath nausea vomiting fevers or chills.  Chronic neuropathy is his main complaint.  Happy was able to eat lunch postprocedure.  No other complaints and looking forward to his revascularization of right leg       Objective:      Temp:  [97.8 F (36.6 C)-98.4 F (36.9 C)] 98.1 F (36.7 C)  Heart Rate:  [68-123] 120  Resp Rate:  [14-18] 14  BP: (123-160)/(66-89) 139/88     General: WD male in no acute distress.  Laying flat in bed with postprocedure angiogram directions  HEENT: eomi, sclera anicteric, mucous membranes moist  Neck: no JVD noted  Cardiovascular: regular rhythm, normal rate  Lungs: normal WOB  Abdomen: Soft, NTND  Extremities: No LE edema, dry bandage over R foot; slight bleeding noted on dressing   Neuro: AAOx3, no gross focal deficits                           [1]   Current Facility-Administered Medications   Medication Dose Route Frequency    acetaminophen   1,000 mg Oral TID    amLODIPine  10 mg Oral Daily    atorvastatin   80 mg Oral Daily    [START ON 11/10/2023] clopidogrel  75 mg Oral Daily    gabapentin   600 mg Oral TID    heparin  (porcine)  5,000 Units Subcutaneous Q8H SCH    insulin  lispro  1-5 Units Subcutaneous TID AC    metoprolol  tartrate  25 mg Oral Q12H SCH    piperacillin-tazobactam  4.5 g Intravenous Q8H    tamsulosin   0.4 mg Oral Daily after dinner    traZODone   50 mg Oral QHS   [2]   Current Facility-Administered Medications   Medication Dose Route Frequency Last Rate    sodium chloride    Intravenous Continuous 50 mL/hr at 11/09/23 0941   [3]   Current Facility-Administered Medications   Medication Dose Route    benzocaine-menthol  1 lozenge Buccal    benzonatate  100 mg Oral    carboxymethylcellulose sodium  1 drop Both Eyes    dextrose   15 g of glucose Oral    Or    dextrose   12.5 g Intravenous    Or    dextrose   12.5 g Intravenous    Or    glucagon  (rDNA)  1 mg Intramuscular    dextrose   15 g of glucose Oral    Or     dextrose   12.5 g Intravenous    Or    dextrose   12.5 g Intravenous    Or    glucagon  (rDNA)  1 mg Intramuscular    magnesium  sulfate  1 g Intravenous    melatonin  3 mg Oral  naloxone   0.2 mg Intravenous    oxyCODONE   10 mg Oral    oxyCODONE   5 mg Oral    potassium & sodium phosphates   2 packet Oral    potassium chloride   0-60 mEq Oral    Or    potassium chloride   0-60 mEq Oral    Or    potassium chloride   10 mEq Intravenous    saline  2 spray Each Nare   [4]   Allergies  Allergen Reactions    Strawberry C [Ascorbate] Hives    Strawberry Extract Hives

## 2023-11-09 NOTE — Progress Notes (Signed)
 CATH LAB PROCEDURE HANDOFF REPORT    Date Time: 11/09/23 8:38 AM    INDICATIONS:    peripheral arterial disease  POST PROCEDURE DEBRIEF:    Peripheral angiography, stent in right External iliac    Instrument/sponge/needle counts, if applicable:  N/A  Specimen labeling, read labels aloud, including name:  N/A  Any applicable equipment problems to be addressed:  N/A    Per MD:  Key concerns for recovery/management: bleeding/hematoma  ALLERGIES:    Strawberry c [ascorbate] and Strawberry extract   ACC BLEEDING RISK SCORE       MEDICAL HISTORY:    Medical History[1]   ACCESS:    64F sheath in right femoral artery  Hemostasis: Mynx at 9 am  Post procedure pulses: absent in right leg/foot  Visual appearance: clean/dry/intact with good distal pulses   MEDICATIONS:    Lidocaine subQ:10mL  Versed: 1 mg IV  Fentanyl: 50 mcg IV  Heparin :  5500 units IV    Loading Dose of: Clopidogrel 150 mg PO  IV Drips:  VITALS:    HR:78           Rhythm: sinus rhythm       BP: 127/61   O2 SAT: 98%        PROCEDURE DETAILS:    Outcomes: See physician note                                Last ACT: No results found for: ISTATACTKAOL       Final Chest Pain Assessment::0/10    Report given to: Clorinda OVEN RN    See Physicians Op note/ Report for details    SAFETY CHECKLIST   I, Randolph Kays, along with reza, have validated the following safety critical items:    Patient ID band present  IV is patent  Last vital signs stable and documented       [1]   Past Medical History:  Diagnosis Date    Hyperlipidemia     Hypertension     Insomnia     Irregular heart beat     Restless leg     Sleep apnea

## 2023-11-10 LAB — ANTI-XA,UFH
Anti-Xa, UFH: 0.04 [IU]/mL
Anti-Xa, UFH: 0.21 [IU]/mL
Anti-Xa, UFH: 0.13 [IU]/mL

## 2023-11-10 LAB — BASIC METABOLIC PANEL
Anion Gap: 5 (ref 5.0–15.0)
BUN: 23 mg/dL (ref 9–28)
CO2: 21 meq/L (ref 17–29)
Calcium: 8.5 mg/dL (ref 7.9–10.2)
Chloride: 111 meq/L (ref 99–111)
Creatinine: 1.8 mg/dL — ABNORMAL HIGH (ref 0.5–1.5)
GFR: 38.8 mL/min/1.73 m2 — ABNORMAL LOW (ref >=60.0)
Glucose: 139 mg/dL — ABNORMAL HIGH (ref 70–100)
Potassium: 4.8 meq/L (ref 3.5–5.3)
Sodium: 137 meq/L (ref 135–145)

## 2023-11-10 LAB — WHOLE BLOOD GLUCOSE POCT
Whole Blood Glucose POCT: 137 mg/dL — ABNORMAL HIGH (ref 70–100)
Whole Blood Glucose POCT: 204 mg/dL — ABNORMAL HIGH (ref 70–100)
Whole Blood Glucose POCT: 149 mg/dL — ABNORMAL HIGH (ref 70–100)
Whole Blood Glucose POCT: 241 mg/dL — ABNORMAL HIGH (ref 70–100)

## 2023-11-10 MED ORDER — RISAQUAD PO CAPS
1.0000 | ORAL_CAPSULE | Freq: Every day | ORAL | Status: DC
Start: 2023-11-10 — End: 2023-11-14
  Administered 2023-11-10 – 2023-11-14 (×5): 1 via ORAL
  Filled 2023-11-10 (×5): qty 1

## 2023-11-10 MED ORDER — SODIUM CHLORIDE 0.9 % IV SOLN
INTRAVENOUS | Status: AC
Start: 2023-11-11 — End: 2023-11-11
  Filled 2023-11-10: qty 1000

## 2023-11-10 NOTE — Progress Notes (Incomplete)
 Foot & Ankle Surgery Progress Note  Spectra  [phone] / Pager [phone]    Date Time: 11/12/23 6:06 AM  Patient Name: Scott Monroe.  Consulting Physician: Rea Agent, [facility] Day: 9      Assessment:   Scott Monroe. is a 78 y.o. male with PMH T2DM (A1c 7.78 2 months ago), HTN, HLD, Afib on Eliquis , smoker, who presents for R hallux OM.  Admitted by medicine for IV antibiotics and further workup of bacteremia 2/2 UTI versus right hallux OM on 11/04/2023. Right hallux wound with seropurulent drainage and probe to bone concerning for osteomyelitis prior to amputation.     Operative Procedures:  10/18 Right hallux bedside I&D prior to discharge from ED  10/22 Right hallux bedside partial hallux amputation, closed primarily   10/25 RLE diagnosti angio  10/27 RLE angio recannulization of R superficial femoral artery    Vitals  - HR 86-137 other VSS, AF     Labs  - BMP pending     Micro/path  - 10/18 Right hallux bedside wcx: MG Pseudomonas aeruginosa, LG Staphylococcus lugdunensis, MG GPC (final)  - 10/18 bcx: GNR (final)  -10/20 repeat bcx: NGTD (final)   - 10/22 IntraOp Right hallux cx: VLG Pseudomonas aeruginosa (final)  - 10/22 Right hallux distal phalanx Path: Acute OM    Imaging  - 10/18 Right hallux XR: Distal phalanx osteomyelitis   - 10/18 NIUS R ABI 0.34, TBI 0.14, L ABI 0.27 TBI 0.15     Plan:   - No further surgical plans and no barriers to discharge from podiatry perspective  - POD 6 s/p 10/22 right partial hallux amputation closed primarily.  - s/p RLE angio with recannulization of R superficial femoral artery with reperfusion of the 3 tibial vessels. Revascularization of popliteal artery likely best performed with saphenou vein graft which may need to be done down the road  - ABX per ID: continue Zosyn.  - Final ID recommendations for discharge is cephalexin and levofloxacin to complete his course of therapy.  - Heel Weight Bearing to the right lower extremity  - DVT ppx per primary:  Eliquis   - Wound Care: Xeroform, 4x4 gauze, Kerlix, and Webril.  Please keep dressings intact. Dsg change performed yesterday after angio.  - Will discuss plan with attending, Dr. Rea Agent, DPM,  and continue to follow    Rosco Maize, DPM  Resident - Foot and Ankle Surgery   [facility] - PGY-1  [phone] [facility] Pager)     Subjective:   Interval History:     Was seen at bedside today resting comfortably in bed. No unanticipated adverse events overnight.  Denies nausea, vomiting, fever, chills.    Review of Symptoms:   As per HPI, stated above.    Physical Exam:   BP 129/62   Pulse 86   Temp 98.5 F (36.9 C) (Oral)   Resp 17   Ht 1.727 m (5' 7.99)   Wt 62.2 kg (137 lb 2 oz)   SpO2 99%   BMI 20.85 kg/m     Gen: NAD, alert and oriented x3  Heart: normal rate  Lung: non-labored, symmetrical chest rise, no audible wheezing  Abdomen: non-distended, soft and non-tender to palpation    Lower Extremity Exam:  Derm:  - Right: Dressing CDI.  No evidence of strikethrough  - Left: No open lesions, no interdigital maceration, no clinical signs of infection       Vasc:  - Right: pulses  nonpalpable, Dopplers monophasic  - Left: Pulses nonpalpable, Dopplers monophasic     Neuro:  - Right: Gross sensation intact. Gross motor function intact.  - Left: Gross sensation intact. Gross motor function intact.     MSK:  - Right: compartments soft, nontender, compressible. s/p partial hallux amputation  - Left: No gross deformities, compartments soft, nontender, compressible    Labs:     Recent Labs   Lab 11/12/23  0328 11/11/23  0926 11/11/23  0421 11/10/23  0417 11/09/23  0326 11/07/23  0358 11/06/23  0327   WBC  --   --  8.25  --  8.32 8.08 7.80   RBC  --   --  3.40*  --  3.30* 3.51* 3.54*   Hemoglobin  --   --  9.4*  --  9.2* 9.8* 9.8*   Hematocrit  --   --  29.2*  --  28.5* 29.8* 30.2*   Glucose 287* 158*  --  139* 181* 199* 163*   BUN 29* 23  --  23 29* 27 35*   Creatinine 1.9* 1.7*  --  1.8* 1.6* 1.5 1.8*    Calcium  8.3 8.8  --  8.5 8.9 8.4 8.2   Sodium 137 139  --  137 134* 136 136   Potassium 4.3 4.3  --  4.8 5.2 4.9 5.0   Chloride 107 110  --  111 106 109 110   CO2 21 20  --  21 21 21 21        Microbiology:     Recent Results (from the past 360 hours)   Culture, Urine    Collection Time: 11/02/23 12:14 PM    Specimen: Urine, Clean Catch   Result Value    Culture Urine >100,000 CFU/mL Klebsiella pneumoniae (A)    Culture Urine >100,000 CFU/mL Escherichia coli (A)       Susceptibility    Escherichia coli - MIC     Amoxicillin/Clavulanic acid <=4/2 Susceptible ug/mL     Ampicillin* <=4 Susceptible ug/mL      * If oral therapy is desired AND ampicillin is susceptible, consider amoxicillin. [place] Antimicrobial Subcommittee Nov. 2020     Ampicillin/Sulbactam 2/1 Susceptible ug/mL     Aztreonam <=2 Susceptible ug/mL     Cefazolin* <=1 Susceptible ug/mL      * Cefazolin interpretation is for uncomplicated UTI only. If oral therapy is desired for uncomplicated UTI AND E.coli is susceptible to cefazolin, consider cephalexin, cefdinir, cefpodoxime, or cefprozil. [place] Antimicrobial Subcommittee Nov. 2020     Cefepime <=1 Susceptible ug/mL     Cefoxitin <=4 Susceptible ug/mL     Ceftazidime <=2 Susceptible ug/mL     Ceftriaxone* <=1 Susceptible ug/mL      * Ceftriaxone does not predict cefdinir susceptibility. [place] Antimicrobial Subcommittee Nov. 2020     Cefuroxime 8 Intermediate ug/mL     Ciprofloxacin <=0.25 Susceptible ug/mL     Ertapenem <=0.25 Susceptible ug/mL     Gentamicin <=2 Susceptible ug/mL     Levofloxacin <=0.5 Susceptible ug/mL     Meropenem <=0.5 Susceptible ug/mL     Nitrofurantoin* <=16 Susceptible ug/mL      * Nitrofurantoin should only be used for the treatment of uncomplicated cystitis.  [facility] Antimicrobial Subcommittee June 2015.     Piperacillin/Tazobactam <=2/4 Susceptible ug/mL     Tetracycline* <=2 Susceptible ug/mL      * Enterobacterales susceptible to tetracycline are also considered  susceptible to doxycycline and minocycline. CLSI M100-ED33:2023  Trimethoprim/Sulfamethoxazole <=0.5/9.5 Susceptible ug/mL    Klebsiella pneumoniae - MIC     Amoxicillin/Clavulanic acid <=4/2 Susceptible ug/mL     Ampicillin* >16 Resistant ug/mL      * If oral therapy is desired AND ampicillin is susceptible, consider amoxicillin. [place] Antimicrobial Subcommittee Nov. 2020     Ampicillin/Sulbactam 8/4 Susceptible ug/mL     Aztreonam <=2 Susceptible ug/mL     Cefazolin* <=1 Susceptible ug/mL      * Cefazolin interpretation is for uncomplicated UTI only. If oral therapy is desired for uncomplicated UTI AND K. pneumoniae is susceptible to cefazolin, consider cephalexin, cefdinir, cefpodoxime, or cefprozil. [place] Antimicrobial Subcommittee Nov. 2020     Cefepime <=1 Susceptible ug/mL     Cefoxitin <=4 Susceptible ug/mL     Ceftazidime <=2 Susceptible ug/mL     Ceftriaxone* <=1 Susceptible ug/mL      * Ceftriaxone does not predict cefdinir susceptibility. [place] Antimicrobial Subcommittee Nov. 2020     Cefuroxime <=4 Susceptible ug/mL     Ciprofloxacin <=0.25 Susceptible ug/mL     Ertapenem <=0.25 Susceptible ug/mL     Gentamicin <=2 Susceptible ug/mL     Levofloxacin <=0.5 Susceptible ug/mL     Meropenem <=0.5 Susceptible ug/mL     Nitrofurantoin* 64 Intermediate ug/mL      * Nitrofurantoin should only be used for the treatment of uncomplicated cystitis. [place] [facility] Antimicrobial Subcommittee June 2015.     Piperacillin/Tazobactam 4/4 Susceptible ug/mL     Tetracycline* <=2 Susceptible ug/mL      * Enterobacterales susceptible to tetracycline are also considered susceptible to doxycycline and minocycline. CLSI M100-ED33:2023     Trimethoprim/Sulfamethoxazole <=0.5/9.5 Susceptible ug/mL   Culture, Blood, Aerobic And Anaerobic    Collection Time: 11/02/23  1:15 PM    Specimen: Blood, Venous   Result Value    Culture Blood No growth at 5 days   Culture, Blood, Aerobic And Anaerobic    Collection Time: 11/02/23  1:31 PM     Specimen: Blood, Venous   Result Value    Culture Blood Growth of Gram negative rod (A)    Gram Stain Aerobic bottle positive for Gram negative rods (AA)   Microbiology Additional Request    Collection Time: 11/02/23  1:31 PM    Specimen: Blood, Venous   Result Value    Microbiology Additional Request For results, see separate report   Culture, Acid Fast Bacilli (AFB/Mycobacteria)(Component)    Collection Time: 11/02/23  2:21 PM    Specimen: Toe, Right; Swab   Result Value    Culture Acid Fast Bacillus (AFB) No growth at 1 week   Culture And Gram Stain, Aerobic Bacteria, Wound/Tissue/Fluid    Collection Time: 11/02/23  2:21 PM    Specimen: Toe, Right; Swab   Result Value    Culture Aerobic Bacteria Moderate growth of Pseudomonas aeruginosa (A)    Culture Aerobic Bacteria Moderate growth of Pseudomonas aeruginosa (A)    Culture Aerobic Bacteria Light growth of Staphylococcus lugdunensis (A)    Gram Stain Few WBCs (A)    Gram Stain No epithelial cells (A)    Gram Stain Moderate Gram positive cocci (A)       Susceptibility    Pseudomonas aeruginosa - MIC     Aztreonam 4 Susceptible ug/mL     Cefepime 2 Susceptible ug/mL     Ceftazidime <=2 Susceptible ug/mL     Ciprofloxacin <=0.25 Susceptible ug/mL     Levofloxacin <=0.5 Susceptible ug/mL     Meropenem <=0.5 Susceptible  ug/mL     Piperacillin/Tazobactam 4/4 Susceptible ug/mL     Tobramycin <=2 Susceptible ug/mL    Pseudomonas aeruginosa - MIC     Aztreonam 8 Susceptible ug/mL     Cefepime 2 Susceptible ug/mL     Ceftazidime <=2 Susceptible ug/mL     Ciprofloxacin <=0.25 Susceptible ug/mL     Levofloxacin <=0.5 Susceptible ug/mL     Meropenem <=0.5 Susceptible ug/mL     Piperacillin/Tazobactam 4/4 Susceptible ug/mL     Tobramycin <=2 Susceptible ug/mL    Staphylococcus lugdunensis - MIC     Clindamycin <=0.5 Susceptible ug/mL     Erythromycin <=0.5 Susceptible ug/mL     Gentamicin <=2 Susceptible ug/mL     Levofloxacin <=1 Susceptible ug/mL     Oxacillin* <=0.25  Susceptible ug/mL      * Oxacillin sensitivity predicts sensitivity to cephalosporins. [place] Antimicrobial Subcommittee 2016     Rifampin <=0.5 Susceptible ug/mL     Tetracycline* <=0.5 Susceptible ug/mL      * Staphylococcus spp. susceptible to tetracycline are also considered susceptible to doxycycline and minocycline. CLSI M100-ED33:2023     Vancomycin 1 Susceptible ug/mL   Culture, Fungal (Component)    Collection Time: 11/02/23  2:21 PM    Specimen: Toe, Right; Swab   Result Value    Culture Fungal No growth at 1 week   Fungal Stain (Component)    Collection Time: 11/02/23  2:21 PM    Specimen: Toe, Right; Swab   Result Value    Fungal Stain No fungal or yeast elements seen   Acid Fast Bacilli Stain (Component)    Collection Time: 11/02/23  2:21 PM    Specimen: Toe, Right; Swab   Result Value    Acid Fast Bacilli Stain No acid fast bacilli seen   Culture, Blood, Aerobic And Anaerobic    Collection Time: 11/04/23 11:01 AM    Specimen: Blood, Venous   Result Value    Culture Blood No growth at 5 days   Nares, MRSA (methicillin-resistant Staphylococcus aureus) Screening, PCR    Collection Time: 11/04/23  9:40 PM    Specimen: Nares; Swab   Result Value    MRSA (methicillin resistant Staphylococcus aureus) DNA Not Detected   Culture And Gram Stain, Aerobic Bacteria, Wound/Tissue/Fluid    Collection Time: 11/06/23 12:05 PM    Specimen: Foot, Right; Swab   Result Value    Culture Aerobic Bacteria Very light growth of Pseudomonas aeruginosa (A)    Gram Stain No squamous epithelial cells seen    Gram Stain Few WBCs    Gram Stain No organisms seen   Fungal Stain (Component)    Collection Time: 11/06/23 12:05 PM    Specimen: Foot, Right; Swab   Result Value    Fungal Stain No fungal or yeast elements seen   Acid Fast Bacilli Stain (Component)    Collection Time: 11/06/23 12:05 PM    Specimen: Foot, Right; Swab   Result Value    Acid Fast Bacilli Stain No acid fast bacilli seen   Microbiology Additional Request     Collection Time: 11/09/23  9:32 AM    Specimen: Debridement Tissue (Specify if applicable); Abscess   Result Value    Microbiology Additional Request For results, see separate report       Radiology:   No results found.

## 2023-11-10 NOTE — Plan of Care (Signed)
 Shift Note:      Orientation:  A&O x4  Rhythm on tele:  Afib w/BBB  Oxygen:  RA  Ambulation:  Stdby assist w/cane or walker  Pain:  Denies any  Lines/Drips:  PIV 20G LFA; 20G Diff RFA;  LIH gtt-next AntiXa @ 04:00  GI/GU:  Chronic foley;  bowel stress incontinence   Fall Score:  Mod     Critical Labs/Imaging/Procedures:   - 10/18 wound cultures + PsA & staph lugdunsis  - 10/22 repeat wound Cx growing PsA  - 10/22 Right partial hallux amputation   - 10/25 Angio w/stent to right external iliac artery     Comments:  No significant change in patient's status.       Plan:   - Q4hr NVC/vitals  - monitor R groin & R hallux amputation sites  - pain mgt  - IV abx  - Fem-Pop intervention next week  - per Urology OP cystoscopy   - LIH gtt - next Anti-Xa @ 20:00  - NPO after midnight for Angio w/Dr. Marinus @ 13:00     Braden Score: 19     Brief hx:  78 y.o. male with PMH T2DM, HTN, HLD, Afib on Eliquis , smoker, presents for R hallux OM.   Admitted for IV antibiotics and further workup of bacteremia d/t UTI versus right hallux OM on 11/04/2023.  Now s/p R partial hallux amputation 10/22 & angio w/stent to right external iliac artery 10/25.

## 2023-11-10 NOTE — Progress Notes (Signed)
 PROGRESS NOTE    Date Time: 11/10/23 9:27 AM  Patient Name: Scott Monroe.                                  NEPHROLOGY PROGRESS NOTE    Follow up for AKI  Subjective:   No complaints    Medications:      Scheduled Meds: PRN Meds:    acetaminophen , 1,000 mg, Oral, TID  amLODIPine, 10 mg, Oral, Daily  atorvastatin , 80 mg, Oral, Daily  clopidogrel, 75 mg, Oral, Daily  gabapentin , 600 mg, Oral, TID  insulin  lispro, 1-5 Units, Subcutaneous, TID AC  metoprolol  tartrate, 25 mg, Oral, Q12H SCH  piperacillin-tazobactam, 4.5 g, Intravenous, Q8H  tamsulosin , 0.4 mg, Oral, Daily after dinner  traZODone , 50 mg, Oral, QHS        Continuous Infusions:   [START ON 11/11/2023] sodium chloride       heparin  infusion 25,000 units/500 mL (Cardiac/Low Intensity) 15 Units/kg/hr (11/10/23 0701)    benzocaine-menthol, 1 lozenge, Q2H PRN  benzonatate, 100 mg, TID PRN  carboxymethylcellulose sodium, 1 drop, TID PRN  dextrose , 15 g of glucose, PRN   Or  dextrose , 12.5 g, PRN   Or  dextrose , 12.5 g, PRN   Or  glucagon  (rDNA), 1 mg, PRN  dextrose , 15 g of glucose, PRN   Or  dextrose , 12.5 g, PRN   Or  dextrose , 12.5 g, PRN   Or  glucagon  (rDNA), 1 mg, PRN  heparin  (porcine), 50 Units/kg, PRN  magnesium  sulfate, 1 g, PRN  melatonin, 3 mg, QHS PRN  naloxone , 0.2 mg, PRN  oxyCODONE , 10 mg, Q6H PRN  oxyCODONE , 5 mg, Q6H PRN  potassium & sodium phosphates , 2 packet, PRN  potassium chloride , 0-60 mEq, PRN   Or  potassium chloride , 0-60 mEq, PRN   Or  potassium chloride , 10 mEq, PRN  saline, 2 spray, Q4H PRN          Review of Systems:    No complaints of chest pain, palpitations shortness of breath, cough, headaches, dizziness, joint pain, abdominal pain, nausea, decreased appetite, or fevers.     Physical Exam:   Patient Vitals for the past 24 hrs:   BP Temp Temp src Pulse Resp SpO2 Weight   11/10/23 0849 114/58 -- -- 98 -- -- --   11/10/23 0735 -- -- -- -- -- -- 56.8 kg (125 lb 3.5 oz)   11/10/23 0700 114/58 97.7 F (36.5 C) Oral 84 22 92 %  --   11/10/23 0420 120/68 98.3 F (36.8 C) Oral 84 18 96 % --   11/10/23 0003 121/68 98.6 F (37 C) Oral 86 17 97 % --   11/09/23 2110 120/65 -- -- (!) 103 -- -- --   11/09/23 1956 141/76 99.3 F (37.4 C) Oral (!) 107 17 96 % --   11/09/23 1544 150/75 98.2 F (36.8 C) Oral (!) 107 16 97 % --   11/09/23 1500 -- -- -- -- -- -- 56.8 kg (125 lb 3.5 oz)   11/09/23 1221 139/88 -- -- -- -- -- --   11/09/23 1146 139/88 98.1 F (36.7 C) Oral (!) 120 14 -- --   11/09/23 1112 139/88 -- -- (!) 123 -- -- --   11/09/23 0941 155/74 97.8 F (36.6 C) Oral 93 18 98 % --       Intake and Output Summary (Last 24 hours) at Date Time  Intake/Output Summary (Last 24 hours) at 11/10/2023 9072  Last data filed at 11/10/2023 0500  Gross per 24 hour   Intake --   Output -1500 ml   Net 1500 ml       General appearance - alert, well appearing, and in no distress  Mental status - alert, oriented to person, place, and time  Chest - clear to auscultation, no wheezes, rales or rhonchi, symmetric air entry  Heart - S1 and S2 normal  Neurological - alert, normal speech  Extremities - peripheral pulses normal, no pedal edema, no clubbing or cyanosis, no pedal edema noted    Labs:     Results for orders placed or performed during the hospital encounter of 11/04/23 (from the past 24 hours)    Collection Time: 11/09/23  9:32 AM    Specimen: Debridement Tissue (Specify if applicable); Abscess   Result Value    Microbiology Additional Request For results, see separate report    Collection Time: 11/09/23  9:41 AM   Result Value    Whole Blood Glucose POCT 134 (H)    Collection Time: 11/09/23 11:46 AM   Result Value    Whole Blood Glucose POCT 151 (H)   APTT    Collection Time: 11/09/23  3:31 PM   Result Value    PTT 29    Collection Time: 11/09/23  3:31 PM   Result Value    Anti-Xa, UFH <0.04    Collection Time: 11/09/23  4:09 PM   Result Value    Whole Blood Glucose POCT 150 (H)    Collection Time: 11/09/23  8:57 PM   Result Value    Whole Blood  Glucose POCT 219 (H)    Collection Time: 11/10/23  4:17 AM   Result Value    Anti-Xa, UFH 0.04   Basic Metabolic Panel    Collection Time: 11/10/23  4:17 AM   Result Value    Glucose 139 (H)    BUN 23    Creatinine 1.8 (H)    Calcium  8.5    Sodium 137    Potassium 4.8    Chloride 111    CO2 21    Anion Gap 5.0    GFR 38.8 (L)    Collection Time: 11/10/23  7:08 AM   Result Value    Whole Blood Glucose POCT 137 (H)           Recent Labs   Lab 11/09/23  0326 11/07/23  0358 11/06/23  0327   WBC 8.32 8.08 7.80   RBC 3.30* 3.51* 3.54*   Hemoglobin 9.2* 9.8* 9.8*   Hematocrit 28.5* 29.8* 30.2*   MCV 86.4 84.9 85.3   MCHC 32.3 32.9 32.5   RDW 15 15 15    MPV 9.6 9.8 9.5   Platelet Count 194 200 203         Recent Labs   Lab 11/10/23  0417 11/09/23  0326 11/07/23  0358 11/06/23  0327 11/05/23  0438   Sodium 137 134* 136 136 139   Potassium 4.8 5.2 4.9 5.0 4.7   Chloride 111 106 109 110 114*   CO2 21 21 21 21 20    BUN 23 29* 27 35* 30*   Creatinine 1.8* 1.6* 1.5 1.8* 1.6*   Glucose 139* 181* 199* 163* 153*   Calcium  8.5 8.9 8.4 8.2 8.5           Rads:   Radiological Procedure reviewed.    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 S/p IV contrast on 10/25. Creatinine increased. Possible pre-renal, unlikely CIN. Encourage PO intake     2) Hypertension: Adequate control with norvasc, lopressor     Plan:   Encourage PO fluid intake  Maintain BP less than 150/80  Monitor renal function      Rylan Kaufmann Geraldine, MD  [facility]  Epic Chat: FX [place]  [facility], or FO [place]  [facility]  Spectra # [phone](after 430 PM, call the office number)  Pg# [phone]  Office # [phone]  After 430 PM, please call the office for person on call  On Weekends, call the office for the person on call

## 2023-11-10 NOTE — Plan of Care (Incomplete)
 Shift Note: 10/26 PM       Orientation: AOx 4  Rhythm on tele: Afib w/ BBB  Oxygen: RA  Ambulation: SBA w/ walker  Pain: Denies  Lines/Drips: PIV 20G LFA; 20G Diff RFA; LIH gtt-next AntiXa @ 04:00   GI/GU: Chronic foley; LBM: 10/26  Fall Score: Moderate    Critical Labs/Imaging/Procedures:   - 10/18 wound cultures + PsA & staph lugdunsis  - 10/22 repeat wound Cx growing PsA  - 10/22 Right partial hallux amputation   - 10/25 Angio w/stent to right external iliac artery    Comments:    - see doc flow for complete assessment and vitals.   - CHG completed during shift.       Significant Shift Events and Questions for Attending:    Plan:   - Vitals Q4H   - Q4hr NVC/vitals  - Monitor R groin & R hallux amputation sites  - pain mgt  - IV abx  - Fem-Pop intervention next week  - Per Urology OP cystoscopy   - LIH gtt - Next Anti-Xa @ 0400  - NPO after midnight for Angio w/Dr. Marinus @ 13:00    (Braden Score: <=18 include skin assessment template)

## 2023-11-10 NOTE — Plan of Care (Addendum)
 Shift Note: 10/25 PM Shift      Orientation: AOx 4  Rhythm on tele: Afib  Oxygen: RA  Ambulation: SBA w/ cane/walker   Pain: controlled   Lines/Drips: 20G RFA infusing LIH gtt & 18G LFA  GI/GU: chronic foley; stress incont of bowel  Fall Score: HIGH     Critical Labs/Imaging/Procedures:   - 10/18 wound cultures + PsA & staph lugdunsis  - 10/22 repeat wound Cx growing PsA  - 10/22 Right partial hallux amputation   - 10/25 Angio w/stent to right external iliac artery     Comments:    - see doc flow for complete assessment and vitals.      Significant Shift Events and Questions for Attending:     Plan:   - Vitals Q4H   - NVC Q4H  - Monitor R Groin & R hallux amputation site; Heel WB to RLE in post-op shoe   - Pain management   - IV Abx  - RLE angio Monday 10/27 @ 1300 for R pop, R SFA and tib artery recanalization   - LIH gtt; Next Anti-xa @ 1200     Braden Score: 19

## 2023-11-10 NOTE — Progress Notes (Signed)
 [facility]  Internal Medicine Hospitalists  Progress Note        Assessment / Plan:        Scott Monroe. is a 78 y.o. male with diabetes, hypertension, hyperlipidemia, chronic HFpEF (grade 1) A-fib (on Eliquis ) with recent ED visit 11/02/2023 for right hallux osteomyelitis with overlying abscess who presented to the emergency room and was noted to have gram-negative bacteremia and UTI with + Ucx(now growing Klebsiella and E. coli).  Recent wound culture growing Pseudomonas and staph ludgenesis.     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 S/p angiogram 10/25.  Additional angiogram planned 10/27        #Osteomyelitis of right lower extremity  - Wound Cx: Pseudomonas and staph lugdunesisi   - Status post right partial hallux amputation 10/22, 10/18 wound cultures --> PsA and Staph lugdunsis; repeat Wound Cx 10/22 PsA growing   - Angiogram 10/25 with stent placed, additional angiogram planned 10/27  - Pain control with scheduled acetaminophen , as needed oxycodone .  Bowel regimen while on opiates     #Gram-negative bacteremia-  - Followed by ID most likely source CAUTI versus osteomyelitis  - F/u BCx 10/18. 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   -s/p angiogram 10/25: Dr. 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 Total occlusion P2 segment of the right popliteal artery with severe stenosis at the origin of the anterior tibial, posterior tibial and short focal occlusion at the origin of the  peroneal artery. All 3 tibial vessels patent beyond the stenoses with continuous flow to the foot   -plan is to bring him back another day for an antegrade approach to try and recanalize the occluded popliteal artery and address the stenoses in the superficial femoral artery as well as in the proximal tibial vessels. This will give him optimal flow to the foot.     #Hyperlipidemia  - Continue with atorvastatin      #Insomnia  - Trazodone      #Acute on chronic renal failure  - Has not been following with specialists (is going to need a transitional care appointment prior to leaving)  - Says he has been told he has issues with renal failure in the past  - Looks like his baseline is around 1.3, already improved  - Continue to monitor  - Hold valsartan , blood pressure stable  -Gentle IVF post angiogram procedure 10/25 and 10/27  - Avoid nephrotoxins      #Diabetes  -Most recent A1C 7.3% (11/04/2023)  -BG trend acceptable in hospital      #Neuropathy  - Continue gabapentin  600 mg po TID  - Holding metformin  during admission  - Sliding scale insulin      #Chronic HFpEF and grade 1 DD -compensated currently. Monitor closely with IVF given for AKI post procedure   # Hypertension - controlled  # CAD  # A-fib  - Continue with atorvastatin   - Metoprolol  25 twice daily  - Holding valsartan  in the setting of renal  failure  - Continue with aspirin   - Holding Eliquis .  Heparin  infusion per vascular surgery  - 10/23 add amlodipine          VTE Prophylaxis: apixaban  (ELIQUIS ) tablet    heparin  25,000 units in dextrose  5% 500 mL infusion (premix)    heparin  (porcine) injection 2,850 Units       Foley Catheter: Chronic urinary retention    Venous Access: No Temporary Central Line Present    Medical Readiness for Discharge: Anticipated in 2-4 Days    Open Handoff Activity in Sidebar    Additional Diagnoses:                 Acute blood loss anemia requiring monitoring     Highest Value Lowest Value Decrease   Hemoglobin 12.3  g/dl  89/79/7974 88:98 AM 9.2 g/dl  89/74/7974  6:73 AM -3.1 g/dl   Hematocrit 61.8%  89/79/7974 11:01 AM  28.5%  11/09/2023  3:26 AM -9.6%           Subjective:      Evaluated patient at the bedside, he was working with nursing staff to get to the chair.  Denies any significant pain, shortness of breath.  Chronic Foley catheter noted, states that it has been in place for the last several months.         Objective:      Temp:  [97.7 F (36.5 C)-99.3 F (37.4 C)] 97.7 F (36.5 C)  Heart Rate:  [84-107] 98  Resp Rate:  [16-22] 22  BP: (114-150)/(58-76) 114/58   Constitutional - resting comfortably, chronically ill-appearing, thin  CV - no loud murmurs, no significant peripheral edema  Resp - CTA bilaterally  GI - soft, non-tender, non-distended  GU-Foley catheter in place  MSK-postoperative wound clean dry and intact  Skin - no rashes or wounds  Neuro - alert, aware, interactive  Psych - appropriate affect

## 2023-11-10 NOTE — Progress Notes (Signed)
 Vascular Surgery Progress Note  [id number]    Date Time: 11/10/23 9:03 AM  Patient Name: Scott Monroe.  Attending Vascular Physician: Dr. Janyce Marinus Salvage Day: 7     PROCEDURES   11/06/2023: R partial hallux amputation (podiatry)   11/09/2023: RLE angiogram with R EIA stent     INTERVAL EVENTS       NAEON  Doing well this AM   WBC 8.32  hgb 9.2 Cr. 1.8  Afebrile, VSS  Right groin access site c/d/I, covered with dressing soft   S/p RLE angiogram with R EIA stent  yesterday with Dr. Marinus     ASSESSMENT    Scott Montford Barg. is a 78 y.o. male w/ PMHx T2DM, HTN, HLD, A. Fib on Eliquis , long time smoker (since 1960s), diabetic neuropathy , bilateral hydronephrosis, bladder mass, chronic foley for urinary retention who presented on 10/20 with a diabetic R great toe ulcer and admitted for GNR bacteremia.      PLAN   RLE Angiogram tomorrow with Dr. Marinus, discussed with Mr. Zaccone today and obtain consent   IVF 50 cc X6 hours pre hydration ordered for tomorrow, will order post procedure hydration afterwards   Continue hep gtt and plavix   Continue NV checks  Remainder of care per primary team     IMAGING RESULTS   No results found.     ADDITIONAL Dx         Highest Value Lowest Value Decrease   Hemoglobin 12.3 g/dl  89/79/7974 88:98 AM 9.2 g/dl  89/74/7974  6:73 AM -3.1 g/dl   Hematocrit 61.8%  89/79/7974 11:01 AM  28.5%  11/09/2023  3:26 AM -9.6%           LABS       Hematology   Recent Labs     11/09/23  0326   WBC 8.32   Hemoglobin 9.2*   Hematocrit 28.5*   Platelet Count 194        Coagulation   Recent Labs     11/09/23  1531   PTT 29       Chemistry   Recent Labs     11/10/23  0417 11/09/23  0326   Sodium 137 134*   Potassium 4.8 5.2   Chloride 111 106   CO2 21 21   BUN 23 29*   Creatinine 1.8* 1.6*   Glucose 139* 181*   Calcium  8.5 8.9        Liver Function Tests   No results for input(s): AST, ALT, ALKPHOS, PROT, ALB, BILITOTAL, BILIDIRECT, LIP, AMY, PREALB  in the last 72 hours.          MEDICATIONS   Current Facility-Administered Medications[1]   heparin  infusion 25,000 units/500 mL (Cardiac/Low Intensity) 15 Units/kg/hr (11/10/23 0701)     benzocaine-menthol, benzonatate, carboxymethylcellulose sodium, dextrose  **OR** dextrose  **OR** dextrose  **OR** glucagon  (rDNA), dextrose  **OR** dextrose  **OR** dextrose  **OR** glucagon  (rDNA), heparin  (porcine), magnesium  sulfate, melatonin, naloxone , oxyCODONE , oxyCODONE , potassium & sodium phosphates , potassium chloride  **OR** potassium chloride  **OR** potassium chloride , saline     INPUT/OUTPUT     Patient Lines/Drains/Airways Status       Active Lines, Drains and Airways       Name Placement date Placement time Site Days    Peripheral IV 11/09/23 20 G Diffusion Anterior;Proximal;Right Forearm 11/09/23  1555  Forearm  less than 1    Peripheral IV 11/09/23 18 G Standard Anterior;Left;Proximal Forearm 11/09/23  2018  Forearm  less  than 1    Urethral Catheter Non-latex;Straight-tip 16 Fr. 11/02/23  1112  Non-latex;Straight-tip  7                      REVIEW OF SYSTEMS   A complete review of systems was performed and was negative except for the pertinent positives documented in the HPI.     PHYSICAL EXAM    Current Vitals:   Vitals:    11/10/23 0849   BP: 114/58   Pulse: 98   Resp:    Temp:    SpO2:        Vital signs in the last 24hrs:   Temp:  [97.7 F (36.5 C)-99.3 F (37.4 C)] 97.7 F (36.5 C)  Heart Rate:  [84-123] 98  Resp Rate:  [14-22] 22  BP: (114-155)/(58-88) 114/58     General: NAD  Neuro: A&O x 3 speech clear and congruent, face symmetric, tongue midline  Cardio: RRR   Pulm: unlabored breathing    Abd: soft, ND, NTTP     Extremities:    Warm   Motor: intact     Sensory: intact     Pulse Exam:   Lower Ext:  Right Left   Femoral: Non - palpable, multiphasic  Femoral: non-palpable, biphasic    Pop: monophasic Pop: monophasic    DP: absent   AT: monophasic  DP: monophasic (distal only)  AT: absent    PT: monophasic  PT:  monophasic       Procedure and risks discussed he understands and consents to the same.               [1]   Current Facility-Administered Medications   Medication Dose Route Frequency    acetaminophen   1,000 mg Oral TID    amLODIPine  10 mg Oral Daily    atorvastatin   80 mg Oral Daily    clopidogrel  75 mg Oral Daily    gabapentin   600 mg Oral TID    insulin  lispro  1-5 Units Subcutaneous TID AC    metoprolol  tartrate  25 mg Oral Q12H SCH    piperacillin-tazobactam  4.5 g Intravenous Q8H    tamsulosin   0.4 mg Oral Daily after dinner    traZODone   50 mg Oral QHS

## 2023-11-11 ENCOUNTER — Encounter
Admission: EM | Disposition: A | Discharge status: Home Health | Payer: Self-pay | Source: Home / Self Care | Attending: Student in an Organized Health Care Education/Training Program

## 2023-11-11 DIAGNOSIS — R7881 Bacteremia: Secondary | ICD-10-CM

## 2023-11-11 HISTORY — PX: ARTERIAL-  LOWER EXTREMITY ANGIOGRAPHY POSS PTA: VAS2057

## 2023-11-11 LAB — BASIC METABOLIC PANEL
Anion Gap: 9 (ref 5.0–15.0)
BUN: 23 mg/dL (ref 9–28)
CO2: 20 meq/L (ref 17–29)
Calcium: 8.8 mg/dL (ref 7.9–10.2)
Chloride: 110 meq/L (ref 99–111)
Creatinine: 1.7 mg/dL — ABNORMAL HIGH (ref 0.5–1.5)
GFR: 41.5 mL/min/1.73 m2 — ABNORMAL LOW (ref >=60.0)
Glucose: 158 mg/dL — ABNORMAL HIGH (ref 70–100)
Potassium: 4.3 meq/L (ref 3.5–5.3)
Sodium: 139 meq/L (ref 135–145)

## 2023-11-11 LAB — ANTI-XA,UFH: Anti-Xa, UFH: 0.21 [IU]/mL

## 2023-11-11 LAB — WHOLE BLOOD GLUCOSE POCT
Whole Blood Glucose POCT: 161 mg/dL — ABNORMAL HIGH (ref 70–100)
Whole Blood Glucose POCT: 147 mg/dL — ABNORMAL HIGH (ref 70–100)
Whole Blood Glucose POCT: 124 mg/dL — ABNORMAL HIGH (ref 70–100)
Whole Blood Glucose POCT: 294 mg/dL — ABNORMAL HIGH (ref 70–100)

## 2023-11-11 LAB — SURGICAL PATHOLOGY

## 2023-11-11 LAB — CBC
Absolute nRBC: 0 x10 3/uL (ref <=0.00)
Hematocrit: 29.2 % — ABNORMAL LOW (ref 37.6–49.6)
Hemoglobin: 9.4 g/dL — ABNORMAL LOW (ref 12.5–17.1)
MCH: 27.6 pg (ref 25.1–33.5)
MCHC: 32.2 g/dL (ref 31.5–35.8)
MCV: 85.9 fL (ref 78.0–96.0)
MPV: 9.5 fL (ref 8.9–12.5)
Platelet Count: 211 x10 3/uL (ref 142–346)
RBC: 3.4 x10 6/uL — ABNORMAL LOW (ref 4.20–5.90)
RDW: 15 % (ref 11–15)
WBC: 8.25 x10 3/uL (ref 3.10–9.50)
nRBC %: 0 /100{WBCs} (ref <=0.0)

## 2023-11-11 LAB — ACT KAOLIN POCT: ACT Kaolin POCT: 210 s — ABNORMAL HIGH (ref 74–147)

## 2023-11-11 LAB — CULTURE AND GRAM STAIN, AEROBIC BACTERIA, WOUND/TISSUE/FLUID

## 2023-11-11 LAB — MICROBIOLOGY ADDITIONAL REQUEST

## 2023-11-11 SURGERY — ARTERIOGRAM EXTREMITY LOWER
Anesthesia: Conscious Sedation

## 2023-11-11 MED ORDER — MIDAZOLAM HCL 1 MG/ML IJ SOLN (WRAP)
INTRAMUSCULAR | Status: AC | PRN
Start: 2023-11-11 — End: 2023-11-11
  Administered 2023-11-11: 1 mg via INTRAVENOUS

## 2023-11-11 MED ORDER — FENTANYL CITRATE (PF) 50 MCG/ML IJ SOLN (WRAP)
INTRAMUSCULAR | Status: AC | PRN
Start: 2023-11-11 — End: 2023-11-11
  Administered 2023-11-11: 25 ug via INTRAVENOUS

## 2023-11-11 MED ORDER — HEPARIN SODIUM (PORCINE) 1000 UNIT/ML IJ SOLN (WRAP)
INTRAMUSCULAR | Status: AC | PRN
Start: 2023-11-11 — End: 2023-11-11
  Administered 2023-11-11: 5000 [IU] via INTRAVENOUS

## 2023-11-11 MED ORDER — MIDAZOLAM HCL 1 MG/ML IJ SOLN (WRAP)
INTRAMUSCULAR | Status: AC
Start: 2023-11-11 — End: 2023-11-11
  Filled 2023-11-11: qty 2

## 2023-11-11 MED ORDER — ASPIRIN 81 MG PO CHEW
81.0000 mg | CHEWABLE_TABLET | Freq: Every day | ORAL | Status: DC
Start: 2023-11-12 — End: 2023-11-14
  Administered 2023-11-12 – 2023-11-14 (×3): 81 mg via ORAL
  Filled 2023-11-11 (×3): qty 1

## 2023-11-11 MED ORDER — FENTANYL CITRATE (PF) 50 MCG/ML IJ SOLN (WRAP)
INTRAMUSCULAR | Status: AC
Start: 2023-11-11 — End: 2023-11-11
  Filled 2023-11-11: qty 2

## 2023-11-11 MED ORDER — HEPARIN SODIUM (PORCINE) 1000 UNIT/ML IJ SOLN (WRAP)
INTRAMUSCULAR | Status: AC | PRN
Start: 2023-11-11 — End: 2023-11-11
  Administered 2023-11-11: 2000 [IU] via INTRAVENOUS

## 2023-11-11 MED ORDER — HEPARIN (PORCINE) IN D5W 50-5 UNIT/ML-% IV SOLN (UNITS/KG/HR ONLY)
12.0000 [IU]/kg/h | INTRAVENOUS | Status: DC
Start: 2023-11-12 — End: 2023-11-13
  Filled 2023-11-11: qty 500

## 2023-11-11 MED ORDER — SODIUM CHLORIDE 0.9 % IV SOLN
INTRAVENOUS | Status: AC
Start: 2023-11-11 — End: 2023-11-12
  Filled 2023-11-11: qty 1000

## 2023-11-11 MED ORDER — HEPARIN (PORCINE) IN NACL 2-0.9 UNIT/ML-% IJ SOLN (WRAP)
INTRAVENOUS | Status: AC
Start: 2023-11-11 — End: 2023-11-11
  Filled 2023-11-11: qty 1000

## 2023-11-11 MED ORDER — LIDOCAINE HCL 1 % IJ SOLN
INTRAMUSCULAR | Status: AC
Start: 2023-11-11 — End: 2023-11-11
  Filled 2023-11-11: qty 20

## 2023-11-11 MED ORDER — FENTANYL CITRATE (PF) 50 MCG/ML IJ SOLN (WRAP)
INTRAMUSCULAR | Status: AC | PRN
Start: 2023-11-11 — End: 2023-11-11
  Administered 2023-11-11: 50 ug via INTRAVENOUS

## 2023-11-11 MED ORDER — SODIUM CHLORIDE 0.9 % IV SOLN
INTRAVENOUS | Status: DC | PRN
Start: 2023-11-11 — End: 2023-11-14
  Administered 2023-11-11: 50 mL/h via INTRAVENOUS

## 2023-11-11 MED ORDER — HEPARIN (PORCINE) IN NACL 1000-0.9 UT/500ML-% IV SOLN - TABLE FLUSH (SEDATION NARRATOR)
INTRAVENOUS | Status: AC | PRN
Start: 2023-11-11 — End: 2023-11-11
  Administered 2023-11-11: 3000 [IU]

## 2023-11-11 MED ORDER — GABAPENTIN 300 MG PO CAPS
300.0000 mg | ORAL_CAPSULE | Freq: Three times a day (TID) | ORAL | Status: DC
Start: 2023-11-11 — End: 2023-11-14
  Administered 2023-11-11 – 2023-11-14 (×10): 300 mg via ORAL
  Filled 2023-11-11 (×10): qty 1

## 2023-11-11 MED ORDER — HEPARIN SODIUM (PORCINE) 1000 UNIT/ML IJ SOLN
INTRAMUSCULAR | Status: AC
Start: 2023-11-11 — End: 2023-11-11
  Filled 2023-11-11: qty 10

## 2023-11-11 MED ORDER — LIDOCAINE HCL 1 % IJ SOLN
INTRAMUSCULAR | Status: AC | PRN
Start: 2023-11-11 — End: 2023-11-11
  Administered 2023-11-11: 5 mL via SUBCUTANEOUS

## 2023-11-11 SURGICAL SUPPLY — 10 items
CATHETER CXI ACCEPTS .018 IN GUIDEWIRE L90 CM OD2.6 FR RADIOPAQUE (Catheter) IMPLANT
CATHETER OD4 FR L65 CM RADIOPAQUE BRAID SELECTIVE 135 D VERT CURVE (Catheter Miscellaneous) IMPLANT
CATHETER SECOD5 MM RANGER BALLOON DILATATION L200 MM L150 CM (Balloons) IMPLANT
DEVICE CLOSURE 5FR CELT ACD PLUS VASCULAR STERILE LATEX FREE DSPOSABLE (Cable) IMPLANT
GUIDEWIRE VASCULAR OD.014 IN L190 CM L8.5 CM ASAHI GLADIUS MONGO 3 CM (Guidewire) IMPLANT
GUIDEWIRE VASCULAR OD.014 IN L300 CM HI-TORQUE COMMAND ES CORE TO TIP (Guidewire) IMPLANT
GUIDEWIRE VASCULAR OD.018 IN L300 CM L10 CM HI-TORQUE COMMAND STRAIGHT (Guidewire) IMPLANT
GUIDEWIRE VASCULAR OD.36 MM L180 CM L3 CM RUNTHROUGH RADIOPAQUE EXTRA (Guidewire) IMPLANT
KIT MICROINTRODUCER L7 CM MAX COAXIAL STIFFEN GUIDEWIRE ECHOGENIC (Introducer) IMPLANT
SHEATH INTRODUCER .035 IN L10 CM L2.5 CM ID5 FR SNAP ON DILATOR LOCK (Sheaths) IMPLANT

## 2023-11-11 NOTE — Discharge Instr - AVS First Page (Addendum)
 Reason for your Hospital Admission:  Right foot osteomyelitis, bacteremia, acute kidney injury, peripheral vascular disease      Instructions for after your discharge:  Follow-up with PCP in a week, referral placed for new PCP.  At that appointment, obtain labs: CBC, BMP, magnesium   Follow-up with vascular surgery for post angiogram care  Follow-up with podiatry  Follow-up with nephrology  Follow-up with infectious disease  Follow-up with your outpatient Urologist for foley catheter removal trial           Foot & Ankle Surgery Patient Discharge Instructions:    Arrange to have an adult drive you home after surgery. If you had general anesthesia, it may take a day or more to fully recover. So, for at least the next 24 hours: Do not drive or use machinery or power tools; do not drink alcohol; and do not make any major decisions.     What to Expect  It is normal to have the following:  Bruising and slight swelling of the foot and toes  A small amount of blood on the dressing    Bandages:  Keep your dressings clean, dry, and intact. Do not remove or change.  When bathing/showering, cover the bandage or cast with a plastic bag to keep it dry.  Dont remove your bandage until your doctor tells you to. If your bandage gets wet or dirty, check with your doctor. You can likely replace it with a clean, dry one.    Activity:  Heel Weight Bearing to the operative lower extremity with surgical shoe until your follow up appointment or cleared by your doctor.  Sit or lay down when possible. Put a pillow under your heel to raise your foot above the level of your heart.  Wrap an ice pack or bag of frozen peas in a thin cloth. Place it over your bandaged foot for no longer than 20 minutes. Do this three times a day.  You can drive again when instrcuted by your doctor.  Wear your surgical shoe at all times unless told otherwise by your health care provider.  Use crutches or a cane as directed.  Follow your doctors instructions about  putting weight on your foot.    Diet:  Start with liquids and light foods (such as dry toast, bananas, and apple sauce). As you feel up to it, slowly return to your normal diet.  Drink at least six to eight glasses of water  or other nonalcoholic fluids a day.  To avoid nausea, eat before taking narcotic pain medications.    Medications:  Take all medications as instructed.  Take pain medications on time. Do not wait until the pain is bad before taking your medications.  Avoid alcohol while on pain medications.    Call your doctor if:  Continuous bleeding through the bandage  Excessive swelling, increased bleeding, or redness  Fever over 100.54F or chills  Pain unrelieved by pain medications  Foot feels cold to the touch or numb  Chest pain or shortness of breath        LOWER EXTREMITY ENDARTERECTOMY / BYPASS  POST-OPERATIVE INSTRUCTIONS:  [id number]  Activity:  No exercise, sex, lifting anything greater than 20 pounds, vigorous activity or driving, for 2 weeks after surgery.   Some fatigue is expected for the first several weeks to a month after your procedure. Fatigue is the most common complaint after surgery.   Gradually increase your level of activity back to normal depending on how you feel.  It is not unusual for the leg and/or foot to swell after surgery due to increased blood flow.  This does not normally indicate infection.  Call the office if you are concerned.  Swelling and bruising around the incision are common after surgery.  This should gradually get better as you recover.  Bruising may change color or location as it heals.  It is very common to have numbness in the thigh, lower leg, or surrounding the incision after surgery.  This normally starts to get better after several weeks but can take months to fully resolve.  Rarely, the numbness can be permanent.  Avoid sitting or standing for long periods of time without moving your legs or feet.   Try to walk a few minutes every hour to facilitate  recovery and prevent blood clots after surgery.  It is normal to have some mild discomfort at the incision at first.  If needed Physical Therapy can be arranged at time of discharge.   Bathing/Showering:  You may shower. Do not scrub the incision site, but simply allow soapy water  to run over it, rinse, and pat dry with a CLEAN towel. Do not allow the incision site to stay wet.    Do not submerse yourself in water  (tubs/pools/hot tubs) until incisions are fully healed.  Care of the Surgical Incision:   If you have a bandage, you may remove it 2 days after surgery  Check your incisional site/sites daily for signs of infection  Staples and sutures were used to closed your incisions, these will be removed at the time of your post-operative visit  There may be steri-strips (Band-Aid type strips) along your incision to help support the incisional site for a few days. These usually begin to peel away after 5-7 days  Avoid creams, powders, or lotions on your incision immediately after surgery unless otherwise directed by your surgeon  Diet:  There are no special restrictions on your diet post-operatively  A Heart Healthy diet is recommended for all vascular patients  Poor appetite is expected for several weeks and small, frequent meals may be preferred  Smoking:  AVOID SMOKING  Smoking in any form should be avoided because it constricts blood vessels and increases the risk of blood clotting  Medications:  Take all your medications as prescribed UNLESS instructed otherwise at discharge.  For mild pain, you make take regular or extra strength Tylenol  every 4-6 hours as needed. Do not exceed 3000 mg per day. Do not take with pain medications that have Tylenol  as an ingredient.   You will be given a new prescription for pain medication as well, which should be taken every 4-6 hours for severe pain only if necessary   The discomfort around the incisions is the greatest during the first 2-3 days and then decreases  daily  Nausea and constipation can occur as a result of taking pain medications and anesthesia. Taking pain medications with a meal or snack may help present nausea, while drinking plenty of liquids and eating high fiber foods (fruits, vegetables and grains) can help prevent constipation. Metamucil or Milk of Magnesia may also be used to treat constipation.   Keep your blood pressure well controlled.  See your primary care provider and/or cardiologist if your blood pressure remains uncontrolled.  Make an appointment with your primary care provider at your convenience to go over your medicines and take care of your other health problems.  Call our nurses line [phone]) if you notice:  Increased pain, swelling,  or redness AT the incision site; remember it is normal to have leg and foot swelling.  Symptoms of infection such as drainage, odor, warmth, or redness at the incision site, or a fever of over 101 degrees.  A change in temperature or skin color below the surgery site.  A return of symptoms similar to those you had before surgery (if your symptoms got better after surgery).  Drainage from the incision or change in drainage from when you left the hospital.    Call 911 and go to the nearest emergency room if you notice:   Significant bleeding from the incision (saturating multiple bandages, blood pulsating, or blood flow that does not stop when pressure is applied).  Sudden or severe chest pain or shortness of breath.  The leg suddenly becomes painful, numb, pale/discolored, and feels cool/cold to the touch.    Follow up: Please go to your scheduled follow-up       Home Health Discharge Information    Your doctor has ordered Skilled Nursing, Physical Therapy, and Occupational Therapy in-home service(s) for you while you recuperate at home, to assist you in the transition from hospital to home.    The agency that you or your representative chose to provide the service:  Name of Home Health Agency Placement:  [facility]]  Phone: [phone] Resumption of care    The above services were set up by:  Hadassah Marien Drone, RN (Post Acute Care Coordinator)     IF YOU HAVE NOT HEARD FROM YOUR HOME HEALTH AGENCY WITHIN 24-48 HOURS AFTER DISCHARGE PLEASE CALL YOUR AGENCY TO ARRANGE A TIME FOR YOUR FIRST VISIT. FOR ANY SCHEDULING CONCERNS OR QUESTIONS RELATED TO HOME HEALTH, SUCH AS TIME OR DATE PLEASE CONTACT YOUR HOME HEALTH AGENCY AT THE NUMBER LISTED ABOVE.

## 2023-11-11 NOTE — Progress Notes (Signed)
 PROGRESS NOTE    Date Time: 11/11/23 11:41 AM  Patient Name: Scott Monroe.                                  NEPHROLOGY PROGRESS NOTE    Follow up for AKI  Subjective:   No complaints    Medications:      Scheduled Meds: PRN Meds:    acetaminophen , 1,000 mg, Oral, TID  amLODIPine , 10 mg, Oral, Daily  atorvastatin , 80 mg, Oral, Daily  clopidogrel , 75 mg, Oral, Daily  gabapentin , 600 mg, Oral, TID  insulin  lispro, 1-5 Units, Subcutaneous, TID AC  metoprolol  tartrate, 25 mg, Oral, Q12H SCH  piperacillin -tazobactam, 4.5 g, Intravenous, Q8H  RisaQuad, 1 capsule, Oral, Daily  tamsulosin , 0.4 mg, Oral, Daily after dinner  traZODone , 50 mg, Oral, QHS        Continuous Infusions:   [Held by provider] heparin  infusion 25,000 units/500 mL (Cardiac/Low Intensity) Stopped (11/11/23 1053)    benzocaine -menthol , 1 lozenge, Q2H PRN  benzonatate , 100 mg, TID PRN  carboxymethylcellulose sodium, 1 drop, TID PRN  dextrose , 15 g of glucose, PRN   Or  dextrose , 12.5 g, PRN   Or  dextrose , 12.5 g, PRN   Or  glucagon  (rDNA), 1 mg, PRN  dextrose , 15 g of glucose, PRN   Or  dextrose , 12.5 g, PRN   Or  dextrose , 12.5 g, PRN   Or  glucagon  (rDNA), 1 mg, PRN  heparin  (porcine), 50 Units/kg, PRN  magnesium  sulfate, 1 g, PRN  melatonin, 3 mg, QHS PRN  naloxone , 0.2 mg, PRN  oxyCODONE , 10 mg, Q6H PRN  oxyCODONE , 5 mg, Q6H PRN  potassium & sodium phosphates , 2 packet, PRN  potassium chloride , 0-60 mEq, PRN   Or  potassium chloride , 0-60 mEq, PRN   Or  potassium chloride , 10 mEq, PRN  saline, 2 spray, Q4H PRN          Review of Systems:    No complaints of chest pain, palpitations shortness of breath, cough, headaches, dizziness, joint pain, abdominal pain, nausea, decreased appetite, or fevers.     Physical Exam:   Patient Vitals for the past 24 hrs:   BP Temp Temp src Pulse Resp SpO2 Weight   11/11/23 0841 155/67 -- -- 85 -- -- --   11/11/23 0731 -- 97.9 F (36.6 C) Oral 96 20 98 % --   11/11/23 0723 -- -- -- -- -- -- 56.8 kg (125 lb 3.5  oz)   11/11/23 0550 180/79 -- -- -- -- -- --   11/11/23 0001 137/64 97.8 F (36.6 C) Oral 77 18 98 % --   11/10/23 2123 -- -- -- -- -- -- 56.8 kg (125 lb 3.5 oz)   11/10/23 2022 157/79 99.2 F (37.3 C) Oral (!) 104 -- -- --   11/10/23 1607 163/76 98.2 F (36.8 C) Oral 91 -- 99 % --   11/10/23 1200 118/68 -- -- 97 -- 98 % --       Intake and Output Summary (Last 24 hours) at Date Time    Intake/Output Summary (Last 24 hours) at 11/11/2023 1141  Last data filed at 11/11/2023 1057  Gross per 24 hour   Intake 226 ml   Output 3975 ml   Net -3749 ml       General appearance - alert, well appearing, and in no distress  Mental status - alert, oriented to person,  place, and time  Chest - clear to auscultation, no wheezes, rales or rhonchi, symmetric air entry  Heart - S1 and S2 normal  Neurological - alert, normal speech  Extremities - no pedal edema noted    Labs:     Results for orders placed or performed during the hospital encounter of 11/04/23 (from the past 24 hours)    Collection Time: 11/10/23 11:44 AM   Result Value    Whole Blood Glucose POCT 204 (H)    Collection Time: 11/10/23 11:48 AM   Result Value    Anti-Xa, UFH 0.21    Collection Time: 11/10/23  4:02 PM   Result Value    Whole Blood Glucose POCT 149 (H)    Collection Time: 11/10/23  8:43 PM   Result Value    Anti-Xa, UFH 0.13    Collection Time: 11/10/23  9:22 PM   Result Value    Whole Blood Glucose POCT 241 (H)    Collection Time: 11/11/23  4:21 AM   Result Value    Anti-Xa, UFH 0.21   CBC without Differential    Collection Time: 11/11/23  4:21 AM   Result Value    WBC 8.25    Hemoglobin 9.4 (L)    Hematocrit 29.2 (L)    Platelet Count 211    MPV 9.5    RBC 3.40 (L)    MCV 85.9    MCH 27.6    MCHC 32.2    RDW 15    nRBC % 0.0    Absolute nRBC 0.00    Collection Time: 11/11/23  7:29 AM   Result Value    Whole Blood Glucose POCT 161 (H)   Basic Metabolic Panel    Collection Time: 11/11/23  9:26 AM   Result Value    Glucose 158 (H)    BUN 23    Creatinine  1.7 (H)    Calcium  8.8    Sodium 139    Potassium 4.3    Chloride 110    CO2 20    Anion Gap 9.0    GFR 41.5 (L)           Recent Labs   Lab 11/11/23  0421 11/09/23  0326 11/07/23  0358   WBC 8.25 8.32 8.08   RBC 3.40* 3.30* 3.51*   Hemoglobin 9.4* 9.2* 9.8*   Hematocrit 29.2* 28.5* 29.8*   MCV 85.9 86.4 84.9   MCHC 32.2 32.3 32.9   RDW 15 15 15    MPV 9.5 9.6 9.8   Platelet Count 211 194 200         Recent Labs   Lab 11/11/23  0926 11/10/23  0417 11/09/23  0326 11/07/23  0358 11/06/23  0327   Sodium 139 137 134* 136 136   Potassium 4.3 4.8 5.2 4.9 5.0   Chloride 110 111 106 109 110   CO2 20 21 21 21 21    BUN 23 23 29* 27 35*   Creatinine 1.7* 1.8* 1.6* 1.5 1.8*   Glucose 158* 139* 181* 199* 163*   Calcium  8.8 8.5 8.9 8.4 8.2             Rads:   Radiological Procedure reviewed.    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 S/p IV contrast on 10/25. Creatinine increased. Possible pre-renal, unlikely CIN. Possible angiogram today. I would start NS 1 hour at 60 ml/hr and continue 6  hours post    2) Hypertension: Adequate control with norvasc , lopressor     Plan:   IV fluids for renal protection from contrast  Maintain BP less than 150/80  Monitor renal function      Viviana Trimble Geraldine, MD  [facility]  Epic Chat: [facility] [place]  [facility], or [facility] [place]  Spectra # [phone](after 430 PM, call the office number)  Pg# [phone]  Office # [phone]  After 430 PM, please call the office for person on call  On Weekends, call the office for the person on call          '

## 2023-11-11 NOTE — OT Progress Note (Signed)
 [facility]   Occupational Therapy Cancellation Note      Patient:  Scott Monroe. MRN#:  [id number]  [facility]  [facility] Room/Bed:  FI350/FI350-01    11/11/2023  Time: 12:37 PM       OT Cancellation: Visit  OT Visit Cancellation Reason: Testing/Procedure. Defer OT at this time. Per RN, patient preparing to go down to surgery. OT to follow up when medically appropriate.        Waddell Leach, OT  Available via Secure Epic Chat

## 2023-11-11 NOTE — Op Note (Signed)
 FULL OPERATIVE NOTE    Date Time: 11/11/23 6:06 PM  Patient Name: Scott Monroe, Scott Monroe (MRN: [id number])  Attending Physician: Ardis Norleen DEL, MD      Date of Operation:   11/11/2023    Providers Performing:   Surgeons and Role:     DEWAINE Marinus Longest, MD - Primary    Surgical First Assistant(s):   None    Operative Procedure:   Arterial-  Lower Extremity Angiography Poss PTA: [id number] (CPT)  [phone] (CPT)      Preoperative Diagnosis:   Pre-Op Diagnosis Codes:      * Coronary artery disease, unspecified vessel or lesion type, unspecified whether angina present, unspecified whether native or transplanted heart [I25.10]    Postoperative Diagnosis:   Post-Op Diagnosis Codes:     * Coronary artery disease, unspecified vessel or lesion type, unspecified whether angina present, unspecified whether native or transplanted heart [I25.10]    Anesthesia:   No value filed.    Findings:   Multiple high-grade stenoses right superficial femoral artery.  Occlusion P2 and P3 segments of the right popliteal artery    Indications:   Nonhealing right foot wound.  Critical limb ischemia    Operative Notes:   Patient was positioned on the angio table both groins were prepped and draped in standard fashion.  Timeout was called.    Patient had revascularization of the right external iliac artery over the weekend with significant improvement in perfusion to the right foot and relief from ischemic rest pain.  He was brought back to the Cath Lab today for a dressing multiple high-grade stenoses in the right superficial femoral artery and possibly crossing the occluded P2 P3 segments of the popliteal artery to provide pulsatile flow to the foot.  All risks had been discussed in detail.    1% Xylocaine  was copiously infiltrated into the skin and subcutaneous tissue over the proximal right superficial femoral artery which was cannulated in antegrade fashion with micropuncture needle.  018 wire was advanced down the femoral artery over which the  dilator and sheath was placed    We then exchanged for a 035 wire and placed a 5 French sheath via the proximal right superficial femoral artery.    We performed right lower extremity angiography by injecting through the SideArm of the sheath.    Findings the right superficial femoral artery is patent but demonstrates multiple areas of high-grade stenoses.  The P1 segment of the popliteal artery is patent however total occlusion of the P2 and P3 segments is noted with reconstitution of the very distal portion of the TP trunk and patent anterior tibial posterior tibial and peroneal arteries.  High-grade stenosis is noted at the origin of the posterior tibial artery.  Both the anterior tibial and posterior tibial are continuous into the foot.    Patient is given 7000 units of heparin  and using a vertebral catheter with an 018 command wire I crossed the multiple high-grade stenoses in the right superficial femoral artery and partially crossed the occlusion in the popliteal artery.  Vertebral catheter the catheter is then exchanged for a microcatheter however the wire and wire in the popliteal artery enters the false channel.  Multiple attempts were then made using an 014 wire to traverse the true channel to enter the tibioperoneal trunk however despite my best efforts including use of 2 wire technique the occlusion in the popliteal artery cannot be crossed reliably.  I did not wish to extend the false channel any further  and therefore decided to address the inflow to the level of the popliteal artery.    We advanced 5 mm x 200 mm DCB Ranger from [place] and performed balloon angioplasty of the multiple areas of stenoses in the right superficial femoral artery with excellent result.  No flow-limiting lesion was left behind.  Brisk flow down the superficial femoral and P1 segment of the popliteal artery was noted and then through copious collaterals there is filling of the distal TP trunk and all 3 tibial vessels.   Revascularization of the popliteal artery is probably best performed using a saphenous vein graft from the P1 segment of the popliteal artery or distal superficial femoral to the posterior tibial artery.  This may or may not be necessary down the road.    With this access was closed with a 5 Celt device without any problems.    Total amount of contrast used was 30 cc 30 cc of Visipaque  fluoroscopy time was 17.5 minutes cumulative air kerma was 38.73 dap was 8.26 Gray per centimeter squared.    We administered Versed  and fentanyl  during the case and monitored his oxygenation and vital signs.  Tolerated the case well total case time was 2 hours.    Patient is in atrial fibrillation therefore the heparin  drip will be restarted without a bolus in 4 hours.               Estimated Blood Loss:   * No values recorded between 11/11/2023  4:35 PM and 11/11/2023  6:06 PM *    Implants:   * No implants in log *       Drains:   Drains: no      Specimens:   * No specimens in log *      Complications:   None    Signed by: Janyce Ovens, MD  [facility] CATH

## 2023-11-11 NOTE — Progress Notes (Signed)
 11/11/23 0938   CM Review   CM Comments 10/27: EDD - Tuesday?  bacteremia  LLE angio today, no additional surgical intervention from podietry, cont zosyn  Dispo - HH SN/OT/PT - pacc referral placed. transport - daughter     CM will continue to monitor patient's progression of care, d/c needs and possible barriers to discharge.       Jovita Almas, MSW, LCSW   Case Manager I Care Management Department   [facility]   [address] [place], [place] [address]   Phone: [phone]  [email or web address]

## 2023-11-11 NOTE — Plan of Care (Signed)
 Post-Procedure Check Note  11/11/2023      Subjective   Pain is well controlled.   Tolerating clears without nausea or vomiting.   + Flatus, none BM  Catheter in place draining clear yellow urine, aUOP.   Ambulating within room.   Denies CP and SOB.     Objective   BP 152/76   Pulse 93   Temp 99 F (37.2 C) (Oral)   Resp 20   Ht 1.727 m (5' 7.99)   Wt 56.8 kg (125 lb 3.5 oz)   SpO2 97%   BMI 19.04 kg/m     PHYSICAL EXAM:  General: NAD  HEENT: mucous membranes moist, atraumatic, normocephalic   Chest: On RA Normal respiratory effort, no stridor, no wheezing  Heart:  Appears clinically well perfused, RRR  Abdomen:  Soft, non-distended, non-tender, no rebound or guarding   Incision/Dressing: R groin c/d/I, tenderness to palpation, mild ecchymosis, no hematoma.   Extremities:  MAE, no edema,  Neuro:  Grossly normal, AxO x 3   Pulse Exam: R AT multi, PT mult, L mult PT/DP     Assessment/Plan   Scott Monroe. is a 78 y.o. male who is POD#0 s/p RLE angiogram. Doing well post-procedure.   -- Continue current pain control regimen  -- ok for regular diet  -- Encourage ambulation, OOB, IS  -- DVT prophylaxis:  HEPARIN        [name], MD  PGY-1 General Surgery

## 2023-11-11 NOTE — Progress Notes (Incomplete)
 Vascular Surgery Progress Note  [id number]    Date Time: 11/11/23 7:51 AM  Patient Name: Scott Monroe.  Attending Vascular Physician: Dr. Janyce Marinus Salvage Day: 8     PROCEDURES   11/06/2023: R partial hallux amputation (podiatry)   11/09/2023: RLE angiogram with R EIA stent     INTERVAL EVENTS       NAEON  Doing well this AM   WBC 8.25(8.32)  hgb (9.2 Cr. 1.8  Afebrile, VSS  Right groin access site c/d/I, covered with dressing soft   S/p RLE angiogram with R EIA stent  yesterday with Dr. Marinus     ASSESSMENT    Scott Monroe Day. is a 78 y.o. male w/ PMHx T2DM, HTN, HLD, A. Fib on Eliquis , long time smoker (since 1960s), diabetic neuropathy , bilateral hydronephrosis, bladder mass, chronic foley for urinary retention who presented on 10/20 with a diabetic R great toe ulcer and admitted for GNR bacteremia.      PLAN   RLE Angiogram tomorrow with Dr. Marinus, discussed with Mr. Jaquith today and obtain consent   IVF 50 cc X6 hours pre hydration ordered for tomorrow, will order post procedure hydration afterwards   Continue hep gtt and plavix    Continue NV checks  Remainder of care per primary team     IMAGING RESULTS   No results found.     ADDITIONAL Dx         Highest Value Lowest Value Decrease   Hemoglobin 12.3 g/dl  89/79/7974 88:98 AM 9.2 g/dl  89/74/7974  6:73 AM -3.1 g/dl   Hematocrit 61.8%  89/79/7974 11:01 AM  28.5%  11/09/2023  3:26 AM -9.6%           LABS       Hematology   Recent Labs     11/11/23  0421 11/09/23  0326   WBC 8.25 8.32   Hemoglobin 9.4* 9.2*   Hematocrit 29.2* 28.5*   Platelet Count 211 194        Coagulation   Recent Labs     11/09/23  1531   PTT 29       Chemistry   Recent Labs     11/10/23  0417 11/09/23  0326   Sodium 137 134*   Potassium 4.8 5.2   Chloride 111 106   CO2 21 21   BUN 23 29*   Creatinine 1.8* 1.6*   Glucose 139* 181*   Calcium  8.5 8.9        Liver Function Tests   No results for input(s): AST, ALT, ALKPHOS, PROT, ALB,  BILITOTAL, BILIDIRECT, LIP, AMY, PREALB in the last 72 hours.          MEDICATIONS   Current Facility-Administered Medications[1]   sodium chloride  50 mL/hr at 11/11/23 0508    heparin  infusion 25,000 units/500 mL (Cardiac/Low Intensity) 19 Units/kg/hr (11/11/23 0628)     benzocaine -menthol , benzonatate , carboxymethylcellulose sodium, dextrose  **OR** dextrose  **OR** dextrose  **OR** glucagon  (rDNA), dextrose  **OR** dextrose  **OR** dextrose  **OR** glucagon  (rDNA), heparin  (porcine), magnesium  sulfate, melatonin, naloxone , oxyCODONE , oxyCODONE , potassium & sodium phosphates , potassium chloride  **OR** potassium chloride  **OR** potassium chloride , saline     INPUT/OUTPUT     Patient Lines/Drains/Airways Status       Active Lines, Drains and Airways       Name Placement date Placement time Site Days    Peripheral IV 11/09/23 20 G Diffusion Anterior;Proximal;Right Forearm 11/09/23  1555  Forearm  1  Peripheral IV 11/09/23 18 G Standard Anterior;Left;Proximal Forearm 11/09/23  2018  Forearm  1    Urethral Catheter Non-latex;Straight-tip 16 Fr. 11/02/23  1112  Non-latex;Straight-tip  8                      REVIEW OF SYSTEMS   A complete review of systems was performed and was negative except for the pertinent positives documented in the HPI.     PHYSICAL EXAM    Current Vitals:   Vitals:    11/11/23 0731   BP:    Pulse: 96   Resp: 20   Temp: 97.9 F (36.6 C)   SpO2: 98%       Vital signs in the last 24hrs:   Temp:  [97.8 F (36.6 C)-99.2 F (37.3 C)] 97.9 F (36.6 C)  Heart Rate:  [77-104] 96  Resp Rate:  [18-20] 20  BP: (114-180)/(58-79) 180/79     General: NAD  Neuro: A&O x 3 speech clear and congruent, face symmetric, tongue midline  Cardio: RRR   Pulm: unlabored breathing    Abd: soft, ND, NTTP     Extremities:    Warm   Motor: intact     Sensory: intact     Pulse Exam:   Lower Ext:  Right Left   Femoral: Non - palpable, multiphasic  Femoral: non-palpable, biphasic    Pop: monophasic Pop: monophasic     DP: absent   AT: monophasic  DP: monophasic (distal only)  AT: absent    PT: monophasic  PT: monophasic       Procedure and risks discussed he understands and consents to the same.                 [1]   Current Facility-Administered Medications   Medication Dose Route Frequency    acetaminophen   1,000 mg Oral TID    amLODIPine   10 mg Oral Daily    atorvastatin   80 mg Oral Daily    clopidogrel   75 mg Oral Daily    gabapentin   600 mg Oral TID    insulin  lispro  1-5 Units Subcutaneous TID AC    metoprolol  tartrate  25 mg Oral Q12H SCH    piperacillin -tazobactam  4.5 g Intravenous Q8H    RisaQuad  1 capsule Oral Daily    tamsulosin   0.4 mg Oral Daily after dinner    traZODone   50 mg Oral QHS

## 2023-11-11 NOTE — Progress Notes (Signed)
 H&P Update with ASA/MALLAMPATTI     Date Time: 11/11/23 9:13 AM    Procedure    RLE angiogram with possible interventions      MALLAMPATI AIRWAY CLASSIFICATION (3)      CLASS I- Full visibility of tonsils, uvula, and soft palate     CLASS II- Visibility of hard and soft palate, upper portion of tonsils and uvula  CLASS III-Soft and hard palate and base of uvula are visible    CLASS IV- Only hard palate visible      ASA PHYSICAL STATUS  (3)     ASA 1   HEALTHY PATIENT  ASA 2   MILD SYSTEMIC ILLNESS  ASA 3   SYSTEMIC DISEASE, NOT INCAPACITATING  ASA 4   SEVERE SYSTEMIC DISEASE, IS CONSTANT THREAT TO LIFE  ASA 5   MORIBUND CONDITION, NOT EXPECTED TO LIVE >24 HOURS           IRRESPECTIVE OF PROCEDURE  E           EMERGENCY PROCEDURE     CONCLUSION       The history and physical currently available in the chart has been reviewed and there are no significant interval changes since prior evaluation.  He has no complaints.  He was seen and examined by me prior to the procedure.  The risks, benefits and alternatives of the procedure have been discussed in detail and he has indicated that he understands the procedure, indications, and risks inherent to the procedure and is amenable to proceeding.  All questions were answered. Informed consent was signed and verified.          Signed by: Kathern LOISE Picking, PA      I have reviewed the notes, assessments, and/or procedures performed by Surgery PA,  Kathern Picking. I concur with her/his documentation of Armoni Depass.SABRA FERNS, Emonii Wienke Marinus, MD, have performed a substantive portion of the encounter by performing the assessment and plan. I reviewed the note and agree with the documented findings and plan of care.      I helped formulate the assessment and plan as documented and agree with the documentation with the following recommendations Procedure and risks discussed he understands and consents to the same.     Daleigh Pollinger Marinus, MD

## 2023-11-11 NOTE — Progress Notes (Addendum)
 CATH LAB PROCEDURE HANDOFF REPORT    Date Time: 11/11/23 4:59 PM    INDICATIONS:    peripheral arterial disease  POST PROCEDURE DEBRIEF:    Peripheral angiography, PTA of SFA    IPer MD:  Key concerns for recovery/management: bleeding/hematoma  ALLERGIES:    Strawberry c [ascorbate] and Strawberry extract   ACC BLEEDING RISK SCORE       MEDICAL HISTORY:    Medical History[1]   ACCESS:    42F sheath in right femoral artery antegrade   Hemostasis: CELT at 1756  Visual appearance: clean/dry/intact with good doplerable distal pulses in right leg/foot  MEDICATIONS:    Lidocaine subQ 5 ml  Versed: 2 mg IV  Fentanyl: 75 mcg IV  Heparin :  7,000 units IV  IV drips: 50 ml/hr   VITALS:    HR:75-105           Rhythm: atrial fibrillation  BP: 181/89/127   O2 SAT: 99%        PROCEDURE DETAILS:    Outcomes: See physician note                                Last ACT: 210 sec.   Final Chest Pain Assessment::0/10    Report given to:  CTUS RN    See Physicians Op note/ Report for details    SAFETY CHECKLIST   I, Donnice Demetrius Backer, along with DR, have validated the following safety critical items:    Patient ID band present  IV is patent  pump meds and rates correct and current as documented in the [facility]  Pulses checked and documented in Epic for femoral access and peripheral vascular cases  Sites dressed, sheath(s) capped with occlusive caps  Patient transported on monitor  EKG attached  Pulse oximetry attached  Last vital signs stable and documented         [1]   Past Medical History:  Diagnosis Date    Hyperlipidemia     Hypertension     Insomnia     Irregular heart beat     Restless leg     Sleep apnea

## 2023-11-11 NOTE — Progress Notes (Signed)
 [facility]  Internal Medicine Hospitalists  Progress Note        Assessment / Plan:        Scott Monroe. is a 78 y.o. male with diabetes, hypertension, hyperlipidemia, chronic HFpEF (grade 1) A-fib (on Eliquis ) with recent ED visit 11/02/2023 for right hallux osteomyelitis with overlying abscess who presented to the emergency room and was noted to have gram-negative bacteremia and UTI with + Ucx(now growing Klebsiella and E. coli).  Recent wound culture growing Pseudomonas and staph ludgenesis.     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 S/p angiogram 10/25.  Additional angiogram planned 10/27.  If stable post procedure, plan for discharge home 10/28        #Osteomyelitis of right lower extremity  - Wound Cx: Pseudomonas and staph lugdunesisi   - Status post right partial hallux amputation 10/22, 10/18 wound cultures --> PsA and Staph lugdunsis; repeat Wound Cx 10/22 PsA growing   - Angiogram 10/25 with stent placed, additional angiogram planned 10/27  - Pain control with scheduled acetaminophen , as needed oxycodone .  Bowel regimen while on opiates     #Gram-negative bacteremia-  - Followed by ID most likely source CAUTI versus osteomyelitis  - F/u BCx 10/18. 10/20 BCx NGTD  - Continue with Zosyn  monotherapy per ID while admitted.  On discharge transition to 2 weeks Levaquin  500 mg p.o. every 48 hour plus Keflex  500 mg every 12 hour end of treatment 11/5     #Chronic indwelling Foley due to urinary retention in the setting of bladder mass  #Bladder mass  #Urinary retention  #CAUTI -Klebsiella/ecoli   - Continue with Zosyn  per ID  - Continue with Foley catheter  - Urology recommending outpatient cystoscopy (has evaluated previously)  - Continue Flomax      PAD - severe.   -s/p angiogram 10/25: Dr. Priscille Op note reviewed   -Severe tubular stenosis right external iliac artery with greater than 90% luminal compromise, multiple high-grade stenoses right superficial femoral artery  proximal, mid and distal. Total occlusion P2 segment of the right popliteal artery with severe stenosis at the origin of the anterior tibial, posterior tibial and short focal occlusion at the origin of the peroneal artery. All 3 tibial vessels patent beyond the stenoses with continuous flow to the foot   -plan is to bring him back another day for an antegrade approach to try and recanalize the occluded popliteal artery and address the stenoses in the superficial femoral artery as well as in the proximal tibial vessels. This will give him optimal flow to the foot.     #Hyperlipidemia  - Continue with atorvastatin      #Insomnia  - Trazodone      #Acute on chronic renal failure  - Has not been following with specialists.  Transitional care referral has been placed  - Hold valsartan  in the setting of contrast studies, blood pressure stable  -Gentle IVF post angiogram procedure 10/25 and 10/27  - Avoid nephrotoxins      #Diabetes  -Most recent A1C 7.3% (11/04/2023)  -BG trend acceptable in hospital      #Neuropathy  - Gabapentin  dose adjusted in the setting of renal dysfunction per pharmacy  - Holding metformin  during admission  - Sliding scale insulin      #Chronic HFpEF and grade 1 DD -compensated currently. Monitor closely with IVF given for AKI post procedure   # Hypertension - controlled  # CAD  # A-fib  - Continue with atorvastatin   - Metoprolol   25 twice daily, amlodipine   - Holding valsartan  in the setting of renal failure  - Continue with aspirin   - Holding Eliquis .  Heparin  infusion per vascular surgery while admitted       VTE Prophylaxis: apixaban  (ELIQUIS ) tablet    heparin  25,000 units in dextrose  5% 500 mL infusion (premix)    heparin  (porcine) injection 2,850 Units       Foley Catheter: Chronic urinary retention    Venous Access: No Temporary Central Line Present    Medical Readiness for Discharge: Anticipated in 2-4 Days    Open Handoff Activity in Sidebar    Additional Diagnoses:                  Acute  blood loss anemia requiring monitoring     Highest Value Lowest Value Decrease   Hemoglobin 12.3 g/dl  89/79/7974 88:98 AM 9.2 g/dl  89/74/7974  6:73 AM -3.1 g/dl   Hematocrit 61.8%  89/79/7974 11:01 AM  28.5%  11/09/2023  3:26 AM -9.6%           Subjective:      Sitting up in bed, resting comfortably, transient hypertension but blood pressure presently stable.  N.p.o. in preparation for angiogram today with vascular surgery.         Objective:      Temp:  [97.8 F (36.6 C)-99.2 F (37.3 C)] 98.3 F (36.8 C)  Heart Rate:  [77-104] 90  Resp Rate:  [18-20] 20  BP: (137-180)/(62-79) 137/62   Constitutional - resting comfortably, chronically ill-appearing, thin  CV - no loud murmurs, no significant peripheral edema  Resp - CTA bilaterally  GI - soft, non-tender, non-distended  GU-Foley catheter in place  MSK-right lower extremity postoperative wound clean dry and intact  Skin - no rashes or wounds  Neuro - alert, aware, interactive  Psych - appropriate affect

## 2023-11-11 NOTE — Progress Notes (Signed)
 Office: [phone]  Epic GroupChat: [facility] ([facility])    Date Time: 11/11/23 @NOW   Patient Name: Scott Monroe, Scott Monroe.      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  Approximately 3 cc of purulent drainage expressed, drainage was cultured.   - cx Ps ae susc P and Staph lugdunensis  - XRAY 10/18  erosive/destructive change with fragmentation in the distal  tuft and shaft of the distal phalanx, right great toe  - partial hallux amputation 10/22  - OR culture growing PsA (S all tested agents)  - OR path pending     Bacteremia GNR  - blood cx 10/18 GNR 1 of 2 sets (could not be identified)     Bladder Mass  Foley in place since August 2025; exchanged Oct18  - urine cx Oct 18 >100 K K pna (R ampicillin) and > 100 K E coli susc (I cefuroxime)      Interval Events/Subjective:   N.p.o. for procedure  No new concerns    Antimicrobials:   #4 Piperacillin -tazobactam    D/c  #2 vanc IV  Estimated Creatinine Clearance: 30.2 mL/min (A) (based on SCr of 1.7 mg/dL (H)).      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  Soft tiossue infection of the toe, no infection changes to rest of foot  - wound w Ps ae and Staph lugdunensisi  S/p Partial Hallux amputation  10/22; cx again Ps ae        Irregular bladder wall thickening identified Aug 2025 along with hydronephrosis and hydroureter  - may be playing a role in bacteremia     Plan:   Asked lab to identify GNR in blood from Oct 18  2 weeks of  levofloxacin  500 mg po q48H + cephalexin  500 mg p q12H  - EOT 11/5  should cover organisms identified in urine and foot  (assuming blood gnr either from urine or from wound)  Needs Urology follow up    Monitoring for clinical response and untoward effects of IV antibiotics    Lines:     Patient Lines/Drains/Airways Status       Active PICC Line / CVC Line / PIV Line / Drain / Airway / Intraosseous Line / Epidural Line / ART Line / Line / Wound / Pressure Ulcer / NG/OG Tube       Name Placement date Placement time Site Days    Peripheral IV 11/06/23 Left Forearm 11/06/23  1709  Forearm  less than 1    Urethral Catheter Non-latex;Straight-tip 16 Fr. 11/02/23  1112  Non-latex;Straight-tip  4                    *I have performed a risk-benefit analysis and the patient needs a central line for access and IV medications      Review of Systems:   Detailed 10 system review was done; pertinent positives  and negatives listed in HPI, otherwise negative in details.      Physical Exam:     Vitals:    11/11/23 1141   BP: 137/62   Pulse: 90   Resp:    Temp: 98.3 F (36.8 C)   SpO2: 98%       No acute distress  Lower extremity in dressing  Images reviewed  Amputation site into      Family History:   Family History[1]    Social History:   Social History[2]    Allergies:   Allergies[3]    Labs:   Available labs reviewed from EMR, independently interpreted    Microbiology:   All data reviewed and pertinent info summarized above, independently interpeted  Rads:   No results found.  Greater than 50 mins spent in direct clinical care, chart review, communication with team members and coordination of complex care for this patient    Signed by: Avelina Dawley, MD             [1] No family history on file.  [2]   Social History  Socioeconomic History    Marital status: Divorced   Tobacco Use    Smoking status: Every Day     Current packs/day: 1.00     Types: Cigarettes    Smokeless tobacco: Never   Vaping Use    Vaping status: Never  Used   Substance and Sexual Activity    Alcohol use: Never    Drug use: Never     Social Drivers of Psychologist, [employer] Strain: High Risk (11/04/2023)    Overall Financial Resource Strain (CARDIA)     Difficulty of Paying Living Expenses: Hard   Food Insecurity: No Food Insecurity (11/04/2023)    Hunger Vital Sign     Worried About Running Out of Food in the Last Year: Never true     Ran Out of Food in the Last Year: Never true   Recent Concern: Food Insecurity - Food Insecurity Present (08/29/2023)    Received from [facility]    Hunger Vital Sign     Within the past 12 months, you worried that your food would run out before you got the money to buy more.: Sometimes true     Within the past 12 months, the food you bought just didn't last and you didn't have money to get more.: Sometimes true   Transportation Needs: No Transportation Needs (11/04/2023)    PRAPARE - Therapist, Art (Medical): No     Lack of Transportation (Non-Medical): No   Recent Concern: Transportation Needs - Unmet Transportation Needs (08/29/2023)    Received from [place] [facility]    [facility] - Transportation     In the past 12 months, has lack of transportation kept you from medical appointments or from getting medications?: Yes     In the past 12 months, has lack of transportation kept you from meetings, work, or from getting things needed for daily living?: Yes   Physical Activity: Inactive (04/22/2021)    Received from [facility]    Exercise Vital Sign     On average, how many days per week do you engage in moderate to strenuous exercise (like a brisk walk)?: 0 days     On average, how many minutes do you engage in exercise at this level?: 0 min   Stress: No Stress Concern Present (  01/15/2022)    Received from [facility]    [facility] - Occupational Stress Questionnaire     Feeling of  Stress : Only a little   Social Connections: Socially Isolated (01/15/2022)    Received from [place] [facility]    Social Connection and Isolation Panel     In a typical week, how many times do you talk on the phone with family, friends, or neighbors?: More than three times a week     How often do you get together with friends or relatives?: Never     How often do you attend church or religious services?: Never     Do you belong to any clubs or organizations such as church groups, unions, fraternal or athletic groups, or school groups?: No     How often do you attend meetings of the clubs or organizations you belong to?: Never     Are you married, widowed, divorced, separated, never married, or living with a partner?: Separated   Intimate Partner Violence: Not At Risk (11/04/2023)    Humiliation, Afraid, Rape, and Kick questionnaire     Fear of Current or Ex-Partner: No     Emotionally Abused: No     Physically Abused: No     Sexually Abused: No   Housing Stability: Not At Risk (11/04/2023)    Housing Stability NCSS     Do you have housing?: Yes     Are you worried about losing your housing?: No   [3]   Allergies  Allergen Reactions    Strawberry C [Ascorbate] Hives    Strawberry Extract Hives

## 2023-11-11 NOTE — Progress Notes (Signed)
 Vascular Surgery Progress Note  [id number]    Date Time: 11/11/23 8:36 AM  Patient Name: Scott Monroe.  Attending Vascular Physician: Dr. Marinus Salvage Day: 8     PROCEDURES   10/25: RLE angiogram with R EIA stent placement    INTERVAL EVENTS       NAEON  Pain adequately controlled  Right groin dressing removed; no issues  Wounds remain stable  Denies any changes in motor and sensation to his LE; no CP, SOB  VSS, H/H 9.4/29; WBC 8k  BMP pending    ASSESSMENT    Bianca Vester. is a 78 y.o. male w/ PMHx DMII, HTN, HLD, a.fib on Eliquis , tobacco abuse (since 1960s), neuropathy, bladder mass, chronic foley for urinary retention who presented on 10/20 with diabetic R great toe ulcer and GNR bacteremia, now s/p R partial hallux amputation (podiatry), RLE angiogram with R EIA stent placement (10/25). With plans to return to cath lab for RLE angiogram     PLAN   NPO for RLE angiogram with possible intervention today with Dr. Marinus Dolin with mIVF @ 50cc/hr x 6hrs prior to cath lab today  Hold hep gtt on call to OR; continue plavix   Follow up BMP  Continue Q4hr NV checks  On Zosyn ; ID following  Remainder of care per primary team    IMAGING RESULTS   10/21: AD/NIUS: ABI/TBI R 0.34/0.14; L 0.27, 0.15; consistent with bilateral severe PAD, RLE- focal occlusion in the mid/distal pop with distal recon, 3v runoff; LLE-known iliac artery stenosis, occlusion of SFA w/ recon of the pop, 3v r/o    LABS       Hematology   Recent Labs     11/11/23  0421 11/09/23  0326   WBC 8.25 8.32   Hemoglobin 9.4* 9.2*   Hematocrit 29.2* 28.5*   Platelet Count 211 194        Coagulation   Recent Labs     11/09/23  1531   PTT 29       Chemistry   Recent Labs     11/10/23  0417 11/09/23  0326   Sodium 137 134*   Potassium 4.8 5.2   Chloride 111 106   CO2 21 21   BUN 23 29*   Creatinine 1.8* 1.6*   Glucose 139* 181*   Calcium  8.5 8.9        Liver Function Tests   No results for input(s): AST, ALT, ALKPHOS,  PROT, ALB, BILITOTAL, BILIDIRECT, LIP, AMY, PREALB in the last 72 hours.          MEDICATIONS   Current Facility-Administered Medications[1]   sodium chloride  50 mL/hr at 11/11/23 0508    heparin  infusion 25,000 units/500 mL (Cardiac/Low Intensity) 19 Units/kg/hr (11/11/23 0628)     benzocaine -menthol , benzonatate , carboxymethylcellulose sodium, dextrose  **OR** dextrose  **OR** dextrose  **OR** glucagon  (rDNA), dextrose  **OR** dextrose  **OR** dextrose  **OR** glucagon  (rDNA), heparin  (porcine), magnesium  sulfate, melatonin, naloxone , oxyCODONE , oxyCODONE , potassium & sodium phosphates , potassium chloride  **OR** potassium chloride  **OR** potassium chloride , saline     INPUT/OUTPUT     Patient Lines/Drains/Airways Status       Active Lines, Drains and Airways       Name Placement date Placement time Site Days    Peripheral IV 11/09/23 20 G Diffusion Anterior;Proximal;Right Forearm 11/09/23  1555  Forearm  1    Peripheral IV 11/09/23 18 G Standard Anterior;Left;Proximal Forearm 11/09/23  2018  Forearm  1    Urethral Catheter  Non-latex;Straight-tip 16 Fr. 11/02/23  1112  Non-latex;Straight-tip  8                      REVIEW OF SYSTEMS   A complete review of systems was performed and was negative except for the pertinent positives documented in the HPI.     PHYSICAL EXAM    Current Vitals:   Vitals:    11/11/23 0731   BP:    Pulse: 96   Resp: 20   Temp: 97.9 F (36.6 C)   SpO2: 98%       Vital signs in the last 24hrs:   Temp:  [97.8 F (36.6 C)-99.2 F (37.3 C)] 97.9 F (36.6 C)  Heart Rate:  [77-104] 96  Resp Rate:  [18-20] 20  BP: (114-180)/(58-79) 180/79     General: NAD, resting in bed comfortably  Neuro: A&O x 3, speech clear and congruent, face symmetric, tongue midline  Cardio: RRR   Pulm: on room air, no audible wheezing, rhonchi.   Abd: soft, ND, NTTP     Extremities:    Warm bilateral LE  Motor intact b/l; sensation diminished to light touch at baseline  R foot wrapped with kerlix, no  drainage    R groin access dressing removed, min surrounding ecchymosis, no swelling, soft, palp nodule    Pulse Exam:   RLE: mono DP/PT  LLE: no DP, mono PT                [1]   Current Facility-Administered Medications   Medication Dose Route Frequency    acetaminophen   1,000 mg Oral TID    amLODIPine   10 mg Oral Daily    atorvastatin   80 mg Oral Daily    clopidogrel   75 mg Oral Daily    gabapentin   600 mg Oral TID    insulin  lispro  1-5 Units Subcutaneous TID AC    metoprolol  tartrate  25 mg Oral Q12H SCH    piperacillin -tazobactam  4.5 g Intravenous Q8H    RisaQuad  1 capsule Oral Daily    tamsulosin   0.4 mg Oral Daily after dinner    traZODone   50 mg Oral QHS

## 2023-11-12 ENCOUNTER — Inpatient Hospital Stay: Admitting: Student in an Organized Health Care Education/Training Program

## 2023-11-12 ENCOUNTER — Encounter
Admission: EM | Disposition: A | Discharge status: Home Health | Payer: Self-pay | Source: Home / Self Care | Attending: Student in an Organized Health Care Education/Training Program

## 2023-11-12 ENCOUNTER — Encounter: Payer: Self-pay | Admitting: Hospitalist

## 2023-11-12 ENCOUNTER — Inpatient Hospital Stay

## 2023-11-12 HISTORY — PX: FEMORAL-POPLITEAL BYPASS: SHX4106

## 2023-11-12 LAB — CVOR1 AGNKC GL THGB CLWBL
Arterial Base Excess: -7.5 meq/L — ABNORMAL LOW (ref <=2.7)
Arterial CO2: 19.1 meq/L — ABNORMAL LOW (ref 23.0–30.0)
Arterial HCO3: 17.9 meq/L — ABNORMAL LOW (ref 23.0–28.0)
Arterial O2 Saturation: 99.8 % — ABNORMAL HIGH (ref 94.0–98.0)
Arterial pCO2: 36.3 mmHg (ref 32.0–48.0)
Arterial pH: 7.308 — ABNORMAL LOW (ref 7.350–7.450)
Arterial pO2: 258 mmHg — ABNORMAL HIGH (ref 83.0–108.0)
Calcium, Ionized: 2.25 meq/L (ref 2.23–2.56)
Hematocrit Total Calculated: 26 % — ABNORMAL LOW (ref 40.0–54.0)
Hemoglobin Total: 8.4 g/dL — ABNORMAL LOW (ref 13.0–17.0)
Temperature: 35.9 C
Whole Blood Chloride: 112 meq/L — ABNORMAL HIGH (ref 98–107)
Whole Blood Glucose: 203 mg/dL — ABNORMAL HIGH (ref 70–100)
Whole Blood Potassium: 4.5 meq/L (ref 3.3–4.6)
Whole Blood Sodium: 138 meq/L (ref 136–145)

## 2023-11-12 LAB — TYPE AND SCREEN
ABO Rh: A POS
Antibody Screen: NEGATIVE

## 2023-11-12 LAB — WHOLE BLOOD GLUCOSE POCT
Whole Blood Glucose POCT: 220 mg/dL — ABNORMAL HIGH (ref 70–100)
Whole Blood Glucose POCT: 147 mg/dL — ABNORMAL HIGH (ref 70–100)
Whole Blood Glucose POCT: 192 mg/dL — ABNORMAL HIGH (ref 70–100)
Whole Blood Glucose POCT: 389 mg/dL — ABNORMAL HIGH (ref 70–100)

## 2023-11-12 LAB — CBC
Absolute nRBC: 0 x10 3/uL (ref <=0.00)
Hematocrit: 24.2 % — ABNORMAL LOW (ref 37.6–49.6)
Hemoglobin: 7.8 g/dL — ABNORMAL LOW (ref 12.5–17.1)
MCH: 27.4 pg (ref 25.1–33.5)
MCHC: 32.2 g/dL (ref 31.5–35.8)
MCV: 84.9 fL (ref 78.0–96.0)
MPV: 9.7 fL (ref 8.9–12.5)
Platelet Count: 217 x10 3/uL (ref 142–346)
RBC: 2.85 x10 6/uL — ABNORMAL LOW (ref 4.20–5.90)
RDW: 15 % (ref 11–15)
WBC: 7.4 x10 3/uL (ref 3.10–9.50)
nRBC %: 0 /100{WBCs} (ref <=0.0)

## 2023-11-12 LAB — BASIC METABOLIC PANEL
Anion Gap: 9 (ref 5.0–15.0)
BUN: 29 mg/dL — ABNORMAL HIGH (ref 9–28)
CO2: 21 meq/L (ref 17–29)
Calcium: 8.3 mg/dL (ref 7.9–10.2)
Chloride: 107 meq/L (ref 99–111)
Creatinine: 1.9 mg/dL — ABNORMAL HIGH (ref 0.5–1.5)
GFR: 36.3 mL/min/1.73 m2 — ABNORMAL LOW (ref >=60.0)
Glucose: 287 mg/dL — ABNORMAL HIGH (ref 70–100)
Potassium: 4.3 meq/L (ref 3.5–5.3)
Sodium: 137 meq/L (ref 135–145)

## 2023-11-12 LAB — WEAK D, REFLEX
Weak D Typing: POSITIVE
Weak D Typing: POSITIVE

## 2023-11-12 LAB — BLOOD TYPE CONFIRMATION: Blood Type Confirmation: A POS

## 2023-11-12 SURGERY — CREATION, BYPASS, ARTERIAL, FEMORAL TO POPLITEAL, USING GRAFT
Anesthesia: Anesthesia General | Laterality: Right | Wound class: Clean

## 2023-11-12 MED ORDER — SODIUM CHLORIDE 0.9 % IV SOLN
INTRAVENOUS | Status: DC | PRN
Start: 2023-11-12 — End: 2023-11-12

## 2023-11-12 MED ORDER — DEXTROSE 10 % IV BOLUS
12.5000 g | INTRAVENOUS | Status: DC | PRN
Start: 2023-11-12 — End: 2023-11-14

## 2023-11-12 MED ORDER — ALBUMIN HUMAN/BIOSIMILIAR 5% IV SOLN (WRAP)
INTRAVENOUS | Status: DC | PRN
Start: 2023-11-12 — End: 2023-11-12

## 2023-11-12 MED ORDER — FENTANYL CITRATE (PF) 50 MCG/ML IJ SOLN (WRAP)
INTRAMUSCULAR | Status: DC | PRN
Start: 2023-11-12 — End: 2023-11-12
  Administered 2023-11-12 (×2): 50 ug via INTRAVENOUS

## 2023-11-12 MED ORDER — FENTANYL CITRATE (PF) 50 MCG/ML IJ SOLN (WRAP)
25.0000 ug | INTRAMUSCULAR | Status: DC | PRN
Start: 2023-11-12 — End: 2023-11-12

## 2023-11-12 MED ORDER — THROMBIN 5000 UNITS EX SOLR (WRAP)
CUTANEOUS | Status: AC
Start: 2023-11-12 — End: 2023-11-12
  Filled 2023-11-12: qty 10000

## 2023-11-12 MED ORDER — NICARDIPINE IV BOLUS SYRINGE (ANESTHESIA)
INTRAVENOUS | Status: DC | PRN
Start: 2023-11-12 — End: 2023-11-12
  Administered 2023-11-12: 50 ug via INTRAVENOUS

## 2023-11-12 MED ORDER — ACETAMINOPHEN 10 MG/ML IV SOLN
INTRAVENOUS | Status: AC
Start: 2023-11-12 — End: 2023-11-12
  Filled 2023-11-12: qty 100

## 2023-11-12 MED ORDER — INSULIN LISPRO 100 UNIT/ML SOLN (WRAP)
2.0000 [IU] | Freq: Three times a day (TID) | Status: DC
Start: 2023-11-13 — End: 2023-11-14
  Administered 2023-11-13: 2 [IU] via SUBCUTANEOUS
  Administered 2023-11-13: 8 [IU] via SUBCUTANEOUS
  Administered 2023-11-13 – 2023-11-14 (×2): 6 [IU] via SUBCUTANEOUS
  Filled 2023-11-12 (×2): qty 18
  Filled 2023-11-12: qty 24
  Filled 2023-11-12: qty 6

## 2023-11-12 MED ORDER — PHENYLEPHRINE 100 MCG/ML IV BOLUS (ANESTHESIA)
PREFILLED_SYRINGE | INTRAVENOUS | Status: DC | PRN
Start: 2023-11-12 — End: 2023-11-12
  Administered 2023-11-12: 100 ug via INTRAVENOUS
  Administered 2023-11-12: 50 ug via INTRAVENOUS
  Administered 2023-11-12: 100 ug via INTRAVENOUS

## 2023-11-12 MED ORDER — HEPARIN SODIUM (PORCINE) 5000 UNIT/ML IJ SOLN
INTRAMUSCULAR | Status: DC | PRN
Start: 2023-11-12 — End: 2023-11-12
  Administered 2023-11-12: 5000 [IU] via SUBCUTANEOUS

## 2023-11-12 MED ORDER — METOPROLOL TARTRATE 5 MG/5ML IV SOLN
INTRAVENOUS | Status: DC | PRN
Start: 2023-11-12 — End: 2023-11-12
  Administered 2023-11-12 (×2): 2.5 mg via INTRAVENOUS

## 2023-11-12 MED ORDER — GELATIN ABSORBABLE 100 EX MISC
CUTANEOUS | Status: DC | PRN
Start: 2023-11-12 — End: 2023-11-12
  Administered 2023-11-12: 1 via TOPICAL

## 2023-11-12 MED ORDER — LACTATED RINGERS IV SOLN
INTRAVENOUS | Status: DC | PRN
Start: 2023-11-12 — End: 2023-11-12

## 2023-11-12 MED ORDER — OXYCODONE HCL 5 MG PO TABS
5.0000 mg | ORAL_TABLET | Freq: Once | ORAL | Status: DC | PRN
Start: 2023-11-12 — End: 2023-11-12

## 2023-11-12 MED ORDER — ACETAMINOPHEN 500 MG PO TABS
1000.0000 mg | ORAL_TABLET | Freq: Four times a day (QID) | ORAL | Status: DC | PRN
Start: 2023-11-12 — End: 2023-11-14

## 2023-11-12 MED ORDER — HYDROMORPHONE HCL 1 MG/ML IJ SOLN
INTRAMUSCULAR | Status: AC
Start: 2023-11-12 — End: 2023-11-12
  Filled 2023-11-12: qty 1

## 2023-11-12 MED ORDER — INSULIN LISPRO 100 UNIT/ML SOLN (WRAP)
1.0000 [IU] | Freq: Every evening | Status: DC
Start: 2023-11-12 — End: 2023-11-14
  Administered 2023-11-12: 6 [IU] via SUBCUTANEOUS
  Filled 2023-11-12: qty 18

## 2023-11-12 MED ORDER — ONDANSETRON HCL 4 MG/2ML IJ SOLN
INTRAMUSCULAR | Status: DC | PRN
Start: 2023-11-12 — End: 2023-11-12
  Administered 2023-11-12: 4 mg via INTRAVENOUS

## 2023-11-12 MED ORDER — METOPROLOL TARTRATE 5 MG/5ML IV SOLN
INTRAVENOUS | Status: AC
Start: 2023-11-12 — End: 2023-11-12
  Filled 2023-11-12: qty 5

## 2023-11-12 MED ORDER — FENTANYL CITRATE (PF) 50 MCG/ML IJ SOLN (WRAP)
INTRAMUSCULAR | Status: AC
Start: 2023-11-12 — End: 2023-11-12
  Filled 2023-11-12: qty 2

## 2023-11-12 MED ORDER — HYDROMORPHONE HCL 1 MG/ML IJ SOLN
0.5000 mg | INTRAMUSCULAR | Status: DC | PRN
Start: 2023-11-12 — End: 2023-11-12

## 2023-11-12 MED ORDER — HYDROMORPHONE HCL 1 MG/ML IJ SOLN
INTRAMUSCULAR | Status: DC | PRN
Start: 2023-11-12 — End: 2023-11-12
  Administered 2023-11-12: 1 mg via INTRAVENOUS

## 2023-11-12 MED ORDER — HEPARIN SODIUM (PORCINE) 5000 UNIT/ML IJ SOLN
INTRAMUSCULAR | Status: AC
Start: 2023-11-12 — End: 2023-11-12
  Filled 2023-11-12: qty 1

## 2023-11-12 MED ORDER — HEPARIN SODIUM (PORCINE) 5000 UNIT/ML IJ SOLN
INTRAMUSCULAR | Status: AC
Start: 2023-11-12 — End: 2023-11-12
  Filled 2023-11-12: qty 3

## 2023-11-12 MED ORDER — PROPOFOL 10 MG/ML IV EMUL (WRAP)
INTRAVENOUS | Status: DC | PRN
Start: 2023-11-12 — End: 2023-11-12
  Administered 2023-11-12: 50 mg via INTRAVENOUS
  Administered 2023-11-12: 30 mg via INTRAVENOUS
  Administered 2023-11-12: 20 mg via INTRAVENOUS
  Administered 2023-11-12: 100 mg via INTRAVENOUS

## 2023-11-12 MED ORDER — ACETAMINOPHEN 500 MG PO TABS
1000.0000 mg | ORAL_TABLET | Freq: Once | ORAL | Status: DC | PRN
Start: 2023-11-12 — End: 2023-11-12

## 2023-11-12 MED ORDER — INSULIN LISPRO 100 UNIT/ML SOLN (WRAP)
Status: AC
Start: 2023-11-12 — End: 2023-11-12
  Administered 2023-11-12: 1 [IU] via SUBCUTANEOUS
  Filled 2023-11-12: qty 3

## 2023-11-12 MED ORDER — PHENYLEPHRINE 100 MCG/ML IV SYRINGE FOR INFUSION (ANESTHESIA)
PREFILLED_SYRINGE | INTRAVENOUS | Status: DC | PRN
Start: 2023-11-12 — End: 2023-11-12
  Administered 2023-11-12: 40 ug/min via INTRAVENOUS

## 2023-11-12 MED ORDER — ROCURONIUM BROMIDE 50 MG/5ML IV SOLN
INTRAVENOUS | Status: AC
Start: 2023-11-12 — End: 2023-11-12
  Filled 2023-11-12: qty 5

## 2023-11-12 MED ORDER — HEPARIN SODIUM (PORCINE) 1000 UNIT/ML IJ SOLN (WRAP)
INTRAMUSCULAR | Status: DC | PRN
Start: 2023-11-12 — End: 2023-11-12
  Administered 2023-11-12: 7000 [IU] via INTRAVENOUS

## 2023-11-12 MED ORDER — ACETAMINOPHEN 10 MG/ML IV SOLN
INTRAVENOUS | Status: DC | PRN
Start: 2023-11-12 — End: 2023-11-12
  Administered 2023-11-12: 1000 mg via INTRAVENOUS

## 2023-11-12 MED ORDER — ROCURONIUM BROMIDE 50 MG/5ML IV SOLN
INTRAVENOUS | Status: AC
Start: 2023-11-12 — End: 2023-11-12
  Filled 2023-11-12: qty 10

## 2023-11-12 MED ORDER — HYDROMORPHONE HCL 1 MG/ML IJ SOLN
0.4000 mg | INTRAMUSCULAR | Status: DC | PRN
Start: 2023-11-12 — End: 2023-11-14
  Administered 2023-11-13: 0.4 mg via INTRAVENOUS
  Filled 2023-11-12: qty 1

## 2023-11-12 MED ORDER — DEXTROSE 50 % IV SOLN
12.5000 g | INTRAVENOUS | Status: DC | PRN
Start: 2023-11-12 — End: 2023-11-14

## 2023-11-12 MED ORDER — ROCURONIUM BROMIDE 10 MG/ML IV SOLN (WRAP)
INTRAVENOUS | Status: DC | PRN
Start: 2023-11-12 — End: 2023-11-12
  Administered 2023-11-12: 30 mg via INTRAVENOUS
  Administered 2023-11-12: 20 mg via INTRAVENOUS
  Administered 2023-11-12: 50 mg via INTRAVENOUS

## 2023-11-12 MED ORDER — PROTAMINE SULFATE 10 MG/ML IV SOLN
INTRAVENOUS | Status: DC | PRN
Start: 2023-11-12 — End: 2023-11-12
  Administered 2023-11-12: 30 mg via INTRAVENOUS

## 2023-11-12 MED ORDER — HEPARIN SODIUM (PORCINE) 1000 UNIT/ML IJ SOLN
INTRAMUSCULAR | Status: AC
Start: 2023-11-12 — End: 2023-11-12
  Filled 2023-11-12: qty 20

## 2023-11-12 MED ORDER — GLUCAGON 1 MG IJ SOLR (WRAP)
1.0000 mg | INTRAMUSCULAR | Status: DC | PRN
Start: 2023-11-12 — End: 2023-11-14

## 2023-11-12 MED ORDER — GLUCOSE 40 % PO GEL (WRAP)
15.0000 g | ORAL | Status: DC | PRN
Start: 2023-11-12 — End: 2023-11-14

## 2023-11-12 MED ORDER — OXYCODONE HCL 5 MG PO TABS
5.0000 mg | ORAL_TABLET | Freq: Four times a day (QID) | ORAL | Status: DC | PRN
Start: 2023-11-12 — End: 2023-11-14

## 2023-11-12 MED ORDER — SODIUM CHLORIDE 0.9 % IR SOLN
Status: DC | PRN
Start: 2023-11-12 — End: 2023-11-12
  Administered 2023-11-12: 1000 mL

## 2023-11-12 MED ORDER — ONDANSETRON HCL 4 MG/2ML IJ SOLN
INTRAMUSCULAR | Status: AC
Start: 2023-11-12 — End: 2023-11-12
  Filled 2023-11-12: qty 2

## 2023-11-12 MED ORDER — LIDOCAINE HCL (PF) 2 % IJ SOLN
INTRAMUSCULAR | Status: AC
Start: 2023-11-12 — End: 2023-11-12
  Filled 2023-11-12: qty 5

## 2023-11-12 MED ORDER — SUGAMMADEX SODIUM 200 MG/2ML IV SOLN
INTRAVENOUS | Status: DC | PRN
Start: 2023-11-12 — End: 2023-11-12
  Administered 2023-11-12: 110 mg via INTRAVENOUS

## 2023-11-12 MED ORDER — PROPOFOL 10 MG/ML IV EMUL (WRAP)
INTRAVENOUS | Status: AC
Start: 2023-11-12 — End: 2023-11-12
  Filled 2023-11-12: qty 40

## 2023-11-12 MED ORDER — DEXAMETHASONE SODIUM PHOSPHATE 4 MG/ML IJ SOLN (WRAP)
INTRAMUSCULAR | Status: DC | PRN
Start: 2023-11-12 — End: 2023-11-12
  Administered 2023-11-12: 4 mg via INTRAVENOUS

## 2023-11-12 MED ORDER — LIDOCAINE HCL 2 % IJ SOLN
INTRAMUSCULAR | Status: DC | PRN
Start: 2023-11-12 — End: 2023-11-12
  Administered 2023-11-12: 50 mg via INTRAVENOUS

## 2023-11-12 MED ORDER — SODIUM CHLORIDE 0.9 % IV SOLN
INTRAVENOUS | Status: DC | PRN
Start: 2023-11-12 — End: 2023-11-12
  Administered 2023-11-12: 100 mL
  Administered 2023-11-12: 500 mL

## 2023-11-12 MED ORDER — THROMBIN 5000 UNITS EX SOLR (WRAP)
CUTANEOUS | Status: DC | PRN
Start: 2023-11-12 — End: 2023-11-12
  Administered 2023-11-12: 10000 [IU] via TOPICAL

## 2023-11-12 SURGICAL SUPPLY — 31 items
ADHESIVE LIQUID PUMP SPRAY BOTTLE BENZOIN 4 OZ (Prep) ×1 IMPLANT
BANDAGE GAUZE L3.6 YD X W3.4 IN 6 PLY ABSORBENT STRETCH TIGHT FINISH (Bandage) IMPLANT
BLADE SURGICAL CLIPPER WIDE W37.2 MM GENERAL PURPOSE EXIST HANDLE DROP (Blade) ×1 IMPLANT
COVER FLEXIBLE LIGHT HANDLE PLASTIC GREEN (Procedure Accessories) ×2 IMPLANT
DECANTER FLUID L9 IN BAG WHITE (Procedure Accessories) ×2 IMPLANT
DRAPE SURGICAL 2 INCISE FILM ANTIMICROBIAL L33 IN X W23 IN IOBAN (Drape) ×1 IMPLANT
DRAPE SURGICAL SHEET FANFOLD L98 IN X W77 IN TIBURON XL PINK (Drape) ×1 IMPLANT
ELECTRODE ADULT PATIENT RETURN L9 FT REM POLYHESIVE ACRYLIC FOAM (Procedure Accessories) ×2 IMPLANT
GLOVE SURGICAL 7 BIOGEL PI ULTRATOUCH M POWDER FREE TEXTURE BEAD CUFF (Glove) ×1 IMPLANT
INSERT CLAMP L33 MM ATRAUMATIC OCCLUSION SOFT TRACTION FOGARTY SMALL (Clips) ×2 IMPLANT
NEEDLE HYPODERMIC L1.5 IN OD19 GA STANDARD REGULAR BEVEL (Needles) ×2 IMPLANT
PADDING CAST L4 YD X W6 IN UNDERCAST MEDLINE COTTON (Patient Supply) IMPLANT
PENCIL SMOKE EVACUATOR COATED PUSH BUTTON NEPTUNE E-SEP (Cautery) ×1 IMPLANT
SET SURGICAL BASIN MAJOR MEDLINE INDUSTRIES, INC. (Kits) ×1 IMPLANT
SOLUTION IRRIGATION 0.9% SODIUM CHLORIDE 1000 ML PLASTIC POUR BOTTLE (Irrigation Solutions) ×1 IMPLANT
SOLUTION IV 0.9% SODIUM CHLORIDE 500 ML PLASTIC CONTAINER (IV Solutions) ×1 IMPLANT
SPONGE GAUZE COTTON L4 IN X W4 IN 12 PLY WOVEN FOLD EDGE STERILE LATEX FREE (Dressing) ×1 IMPLANT
SPONGE GAUZE L4 IN X W4 IN 12 PLY WOVEN FOLD EDGE XRAY DETECT COTTON (Dressing) ×1 IMPLANT
SPONGE LAPAROTOMY L18 IN X W18 IN 4 PLY RADIOPAQUE PYRONEMA FREE HIGH (Sponge) ×1 IMPLANT
STOCKINETTE COTTON L36 IN X W9IN HEAVY WEIGHT IMPERVIOUS MEDLN ORTHPDC (Drape) IMPLANT
SUTURE COATED VICRYL 2-0 CT-1 L36 IN BRAID COATED VIOLET ABSORBABLE (Suture) ×4 IMPLANT
SUTURE COATED VICRYL 3-0 CT-1 L27 IN BRAID COATED UNDYED ABSORBABLE (Suture) ×4 IMPLANT
SUTURE MONOCRYL 4-0 PS-2 L27 IN MONOFILAMENT UNDYED ABSORBABLE (Suture) IMPLANT
SUTURE PROLENE 6-0 RB2 18IN (Suture) IMPLANT
SUTURE PROLENE BLUE 5-0 RB-2 L30 IN 2 ARM MONOFILAMENT NONABSORBABLE (Suture) IMPLANT
SUTURE PROLENE BLUE 7-0 BV-1 L24 IN 2 ARM MONOFILAMENT NONABSORBABLE (Suture) IMPLANT
SUTURE SILK PERMA HAND BLACK 2-0 SH L30 IN BRAID NONABSORBABLE (Suture) IMPLANT
SUTURE SILK PERMA HAND BLACK 3-0 L18 IN BRAID TIES 12 STRAND PRECUT (Suture) IMPLANT
SUTURE SILK PERMA HAND BLACK 4-0 L18 IN BRAID TIES 12 STRAND PRECUT (Suture) IMPLANT
SYRINGE 10 ML GRADUATE NONPYROGENIC DEHP FREE PVC FREE LOK MEDICAL (Syringes, Needles) ×2 IMPLANT
TRAY SURGICAL VASCULAR MAJOR ~~LOC~~ (Pack) ×1 IMPLANT

## 2023-11-12 NOTE — Progress Notes (Addendum)
 [facility]  Internal Medicine Hospitalists  Progress Note        Assessment / Plan:        Scott Jamar. is a 78 y.o. male with diabetes, hypertension, hyperlipidemia, chronic HFpEF (grade 1) A-fib (on Eliquis ) with recent ED visit 11/02/2023 for right hallux osteomyelitis with overlying abscess who presented to the emergency room and was noted to have gram-negative bacteremia and UTI with + Ucx(now growing Klebsiella and E. coli).  Recent wound culture growing Pseudomonas and staph ludgenesis.     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 S/p angiogram 10/25.  Additional angiogram 10/27, bypass performed on 10/28. Will need PT again and resumption of AC and aspirin  before [place].      #Osteomyelitis of right lower extremity  - Wound Cx: Pseudomonas and staph lugdunesisi   - Status post right partial hallux amputation 10/22, 10/18 wound cultures --> PsA and Staph lugdunsis; repeat Wound Cx 10/22 PsA growing   - Angiogram 10/25 with stent placed, additional angiogram planned 10/27  - Pain control with scheduled acetaminophen , as needed oxycodone .  Bowel regimen while on opiates     #Gram-negative bacteremia  - Followed by ID most likely source CAUTI versus osteomyelitis  - F/u BCx 10/18. 10/20 BCx NGTD  - Continue with Zosyn monotherapy per ID while admitted.  On discharge transition to 2 weeks Levaquin 500 mg p.o. 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   -s/p angiogram 10/25: Dr. Priscille Op note reviewed   -S/p SFA to PT bypass on 10/28  -Can resume anticoagulation and aspirin  tomorrow  -PT pending     #Hyperlipidemia  - Continue with  atorvastatin      #Insomnia  - Trazodone      #Acute on chronic renal failure  - Has not been following with specialists.  Transitional care referral has been placed  - Hold valsartan  in the setting of contrast studies, blood pressure stable  -Gentle IVF post angiogram procedure 10/25 and 10/27  - Avoid nephrotoxins      #Diabetes  -Most recent A1C 7.3% (11/04/2023)  -BG trend acceptable in hospital      #Neuropathy  - Gabapentin  dose adjusted in the setting of renal dysfunction per pharmacy  - Holding metformin  during admission  - Sliding scale insulin      #Chronic HFpEF and grade 1 DD -compensated currently. Monitor closely with IVF given for AKI post procedure   # Hypertension - controlled  # CAD  # A-fib  - Continue with atorvastatin   - Metoprolol  25 twice daily, amlodipine  - Holding valsartan  in the setting of renal failure  - Continue with aspirin   - Holding Eliquis .  Heparin  infusion per vascular surgery while admitted       VTE Prophylaxis: apixaban  (ELIQUIS ) tablet    heparin  (porcine) injection 2,850 Units    heparin  25,000 units in dextrose  5% 500 mL infusion (premix)       Foley Catheter: Chronic urinary retention    Venous Access: No Temporary Central Line Present    Medical Readiness for Discharge: Anticipated in 2-4 Days  Open Handoff Activity in Sidebar    Additional Diagnoses:                  Acute blood loss anemia requiring monitoring     Highest Value Lowest Value Decrease   Hemoglobin 12.3 g/dl  89/79/7974 88:98 AM 7.8 g/dl  89/71/7974  1:99 AM -4.5 g/dl   Hematocrit 61.8%  89/79/7974 11:01 AM  24.2%  11/12/2023  8:00 AM -13.9%           Subjective:      Sitting in chair, has noticed some bruising on right side of pelvis, otherwise well. Denies any pain, nausea, vomiting       Objective:      Temp:  [97 F (36.1 C)-98.8 F (37.1 C)] 97 F (36.1 C)  Heart Rate:  [86-137] 106  Resp Rate:  [10-20] 11  BP: (112-187)/(62-99) 155/80   Constitutional - resting comfortably, chronically  ill-appearing, thin  CV - no loud murmurs, no significant peripheral edema  Resp - CTA bilaterally  GI - soft, non-tender, non-distended  GU-Foley catheter in place  MSK-right lower extremity postoperative wound clean dry and intact  Skin - Some bruising noted in right side of pelvis  Neuro - alert, aware, interactive  Psych - appropriate affect

## 2023-11-12 NOTE — Plan of Care (Signed)
 Surgery Post-Op Check Note  11/12/2023      Subjective   Pain is well controlled.   Tolerating clears without nausea or vomiting.   Voiding spontaneously, aUOP  Denies CP and SOB.     Objective   BP 139/71   Pulse (!) 103   Temp 98.1 F (36.7 C) (Oral)   Resp 18   Ht 1.727 m (5' 8)   Wt 56.2 kg (123 lb 14.4 oz)   SpO2 100%   BMI 18.84 kg/m     PHYSICAL EXAM:  General: NAD  HEENT: mucous membranes moist, atraumatic, normocephalic   Chest: On  RA Normal respiratory effort, no stridor, no wheezing  Heart:  Appears clinically well perfused, RRR  Abdomen:  Soft, non-distended, non-tender, no rebound or guarding.   Incision/Dressing:  RLE in ace wrap  c/d/i  Extremities:  MAE, no edema  Neuro:  Grossly normal, AxO x 3   Pulse exam: R PT/DP/AT multi, L PT/DP mulit    Assessment/Plan   Scott Monroe. is a 78 y.o. male who is POD#0 s/p Procedure(s):  CREATION, BYPASS, ARTERIAL, FEMORAL TO POSTERIOR POPLITEAL; REVERSE SAPHENOUS VEIN BYPASS. Doing well post-operatively.   -- Continue current pain control regimen  -- ok for regular diet  -- Encourage ambulation, OOB, IS  -- DVT prophylaxis:  HEPARIN       [name], MD  PGY-1 General Surgery

## 2023-11-12 NOTE — Anesthesia Preprocedure Evaluation (Signed)
 Anesthesia Evaluation    AIRWAY    Mallampati: II    TM distance: >3 FB  Neck ROM: full  Mouth Opening:full   CARDIOVASCULAR    irregular and normal       DENTAL                     PULMONARY    pulmonary exam normal     OTHER FINDINGS                                        Relevant Problems   CARDIO   (+) Atrial fibrillation (CMS/HCC)   (+) Atrial fibrillation with RVR (CMS/HCC)   (+) PAD (peripheral artery disease)      ENDO   (+) Uncontrolled type 2 diabetes mellitus with hyperglycemia (CMS/HCC)      OTHER   (+) Primary osteoarthritis of left knee     Normal EF            Anesthesia Plan    ASA 3     general                     intravenous induction   Detailed anesthesia plan: general endotracheal  Monitors/Adjuncts: arterial line          informed consent obtained    Plan discussed with CRNA.    ECG reviewed  pertinent labs reviewed               Signed by: Ripley Leek, MD 11/12/23 11:23 AM

## 2023-11-12 NOTE — OT Progress Note (Signed)
 [facility]   Occupational Therapy Cancellation Note      Patient:  Scott Monroe. MRN#:  [id number]  [facility]  [facility] Room/Bed:  FI350/FI350-01    11/12/2023  Time: 9:52 AM       OT Cancellation: Visit  OT Visit Cancellation Reason: Testing/Procedure. Patient off unit for procedure. Defer OT at this time. OT to follow up when medically appropriate.          Waddell Leach, OT  Available via Secure Epic Chat

## 2023-11-12 NOTE — Plan of Care (Signed)
 Shift Note: 10/28 AM     Orientation: AOx 4  Rhythm on tele: A fib w/BBB  Oxygen: RA  Ambulation: SBA w/ crutches or walker   Pain: Denies  Lines/Drips: 20G LFA, 20G RFA, 18G R Wrist  GI/GU: Chronic Foley; Bowel Stress Incontinence; LBM 10/26  Fall Score: Moderate    Critical Labs/Imaging/Procedures:   10/18: wound cultures + PsA & staph lugdunsis  10/22: repeat wound Cx growing PsA  10/22: Right partial hallux amputation   10/25: Angio w/stent to right external iliac artery  10/27: RLE Angiogram  10/28 US : pre-procedure vein mapping, no evidence of superficial thrombosis  10/28 Fem-Pop Bypass    Comments:   - see doc flow for complete assessment and vitals.   - Stopped Heparin  at 0815 pre-procedure    Significant Shift Events and Questions for Attending:    Plan:   - Vitals Q4H   - Q4H NVC  - Monitor R groin site & R Hallux amputation  - IV Abx  - Pain management  - Per Urology OP cytoscopy  - Resume Heparin  and Aspirin  10/29    Braden Score: 19

## 2023-11-12 NOTE — Progress Notes (Signed)
 Plan to proceed with right femoral to posterior tibial reverse saphenous vein bypass graft today to provide pulsatile flow to the right foot for limb salvage..  This patient had 3 levels of major arterial occlusion to the right leg.  The occlusion of the right external iliac artery was stented.  The multiple high-grade stenoses in the right SFA were treated with [facility] angioplasty.  The right popliteal artery occlusion which could not be crossed is planned for bypass today from the femoral to the posterior tibial artery.    Procedure and risks have been discussed in detail with the patient who understands and consents to the same.  I tried to call his daughter however her voicemail is full and she did not pick up the phone.

## 2023-11-12 NOTE — Progress Notes (Signed)
 Foot & Ankle Surgery Progress Note  Spectra  [phone] / Pager [phone]    Date Time: 11/13/23 5:40 AM  Patient Name: Scott Monroe, [name]  Consulting Physician: Rea Agent, [facility] Day: 10      Assessment:   Scott Kotyk Akim Watkinson. is a 78 y.o. male with PMH T2DM (A1c 7.78 2 months ago), HTN, HLD, Afib on Eliquis , smoker, who presents for R hallux OM.  Admitted by medicine for IV antibiotics and further workup of bacteremia 2/2 UTI versus right hallux OM on 11/04/2023. Right hallux wound with seropurulent drainage and probe to bone concerning for osteomyelitis prior to amputation.     Operative Procedures:  10/18 Right hallux bedside I&D prior to discharge from ED  10/22 Right hallux bedside partial hallux amputation, closed primarily   10/25 RLE diagnosti angio  10/27 RLE angio recannulization of R superficial femoral artery  10/28 RLE fem-pop bypass    Vitals  - HR 96 other VSS, AF     Labs  - WBC 7.52  - Hemoglobin 8.1  - Glucose 431  - Creatinine 1.7     Micro/path  - 10/18 Right hallux bedside wcx: MG Pseudomonas aeruginosa, LG Staphylococcus lugdunensis, MG GPC (final)  - 10/18 bcx: GNR (final)  -10/20 repeat bcx: NGTD (final)   - 10/22 IntraOp Right hallux cx: VLG Pseudomonas aeruginosa (final)  - 10/22 Right hallux distal phalanx Path: Acute OM    Imaging  - 10/18 Right hallux XR: Distal phalanx osteomyelitis   - 10/18 NIUS R ABI 0.34, TBI 0.14, L ABI 0.27 TBI 0.15     Plan:   - No further surgical plans and no barriers to discharge from podiatry perspective  - POD 7 s/p 10/22 right partial hallux amputation closed primarily.  - s/p 10/28 RLE fem-pop bypass  - ABX per ID: continue Zosyn.  - Final ID recommendations for discharge is cephalexin and levofloxacin EOT 11/5  - Heel Weight Bearing to the right lower extremity  - DVT ppx per vascular: Heparin  gtt  - Wound Care: Xeroform, 4x4 gauze, Kerlix, and Webril.  Please keep dressings intact to be managed by podiatry  - Will discuss plan with  attending, Dr. Rea Agent, DPM,  and continue to follow    Rosco Maize, DPM  Resident - Foot and Ankle Surgery   [facility] - PGY-1  [phone] [facility] Pager)     Subjective:   Interval History:     Was seen at bedside today resting comfortably in bed. No unanticipated adverse events overnight.  Denies nausea, vomiting, fever, chills.    Review of Symptoms:   As per HPI, stated above.    Physical Exam:   BP 154/72   Pulse 96   Temp 97.6 F (36.4 C) (Oral)   Resp 16   Ht 1.727 m (5' 8)   Wt 56.2 kg (123 lb 14.4 oz)   SpO2 98%   BMI 18.84 kg/m     Gen: NAD, alert and oriented x3  Heart: normal rate  Lung: non-labored, symmetrical chest rise, no audible wheezing  Abdomen: non-distended, soft and non-tender to palpation    Lower Extremity Exam:  Derm:  - Right: Dressing CDI  - Left: No open lesions, no interdigital maceration, no clinical signs of infection       Vasc:  - Right: pulses nonpalpable, Dopplers monophasic  - Left: Pulses nonpalpable, Dopplers monophasic     Neuro:  - Right: Gross sensation intact. Gross motor function  intact.  - Left: Gross sensation intact. Gross motor function intact.     MSK:  - Right: compartments soft, nontender, compressible. s/p partial hallux amputation  - Left: No gross deformities, compartments soft, nontender, compressible    Labs:     Recent Labs   Lab 11/13/23  0346 11/12/23  0800 11/12/23  0328 11/11/23  0926 11/11/23  0421 11/10/23  0417 11/09/23  0326   WBC 7.52 7.40  --   --  8.25  --  8.32   RBC 2.93* 2.85*  --   --  3.40*  --  3.30*   Hemoglobin 8.1* 7.8*  --   --  9.4*  --  9.2*   Hematocrit 24.9* 24.2*  --   --  29.2*  --  28.5*   Glucose 431*  --  287* 158*  --  139* 181*   BUN 29*  --  29* 23  --  23 29*   Creatinine 1.7*  --  1.9* 1.7*  --  1.8* 1.6*   Calcium  8.1  --  8.3 8.8  --  8.5 8.9   Sodium 136  --  137 139  --  137 134*   Potassium 4.7  --  4.3 4.3  --  4.8 5.2   Chloride 108  --  107 110  --  111 106   CO2 19  --  21 20  --  21 21        Microbiology:     Recent Results (from the past 360 hours)   Culture, Urine    Collection Time: 11/02/23 12:14 PM    Specimen: Urine, Clean Catch   Result Value    Culture Urine >100,000 CFU/mL Klebsiella pneumoniae (A)    Culture Urine >100,000 CFU/mL Escherichia coli (A)       Susceptibility    Escherichia coli - MIC     Amoxicillin/Clavulanic acid <=4/2 Susceptible ug/mL     Ampicillin* <=4 Susceptible ug/mL      * If oral therapy is desired AND ampicillin is susceptible, consider amoxicillin. [place] Antimicrobial Subcommittee Nov. 2020     Ampicillin/Sulbactam 2/1 Susceptible ug/mL     Aztreonam <=2 Susceptible ug/mL     Cefazolin* <=1 Susceptible ug/mL      * Cefazolin interpretation is for uncomplicated UTI only. If oral therapy is desired for uncomplicated UTI AND E.coli is susceptible to cefazolin, consider cephalexin, cefdinir, cefpodoxime, or cefprozil. [place] Antimicrobial Subcommittee Nov. 2020     Cefepime <=1 Susceptible ug/mL     Cefoxitin <=4 Susceptible ug/mL     Ceftazidime <=2 Susceptible ug/mL     Ceftriaxone* <=1 Susceptible ug/mL      * Ceftriaxone does not predict cefdinir susceptibility. [place] Antimicrobial Subcommittee Nov. 2020     Cefuroxime 8 Intermediate ug/mL     Ciprofloxacin <=0.25 Susceptible ug/mL     Ertapenem <=0.25 Susceptible ug/mL     Gentamicin <=2 Susceptible ug/mL     Levofloxacin <=0.5 Susceptible ug/mL     Meropenem <=0.5 Susceptible ug/mL     Nitrofurantoin* <=16 Susceptible ug/mL      * Nitrofurantoin should only be used for the treatment of uncomplicated cystitis. [place] [facility] Antimicrobial Subcommittee June 2015.     Piperacillin/Tazobactam <=2/4 Susceptible ug/mL     Tetracycline* <=2 Susceptible ug/mL      * Enterobacterales susceptible to tetracycline are also considered susceptible to doxycycline and minocycline. CLSI M100-ED33:2023     Trimethoprim/Sulfamethoxazole <=0.5/9.5 Susceptible ug/mL    Klebsiella  pneumoniae - MIC     Amoxicillin/Clavulanic acid <=4/2  Susceptible ug/mL     Ampicillin* >16 Resistant ug/mL      * If oral therapy is desired AND ampicillin is susceptible, consider amoxicillin. [place] Antimicrobial Subcommittee Nov. 2020     Ampicillin/Sulbactam 8/4 Susceptible ug/mL     Aztreonam <=2 Susceptible ug/mL     Cefazolin* <=1 Susceptible ug/mL      * Cefazolin interpretation is for uncomplicated UTI only. If oral therapy is desired for uncomplicated UTI AND K. pneumoniae is susceptible to cefazolin, consider cephalexin, cefdinir, cefpodoxime, or cefprozil. [place] Antimicrobial Subcommittee Nov. 2020     Cefepime <=1 Susceptible ug/mL     Cefoxitin <=4 Susceptible ug/mL     Ceftazidime <=2 Susceptible ug/mL     Ceftriaxone* <=1 Susceptible ug/mL      * Ceftriaxone does not predict cefdinir susceptibility. [place] Antimicrobial Subcommittee Nov. 2020     Cefuroxime <=4 Susceptible ug/mL     Ciprofloxacin <=0.25 Susceptible ug/mL     Ertapenem <=0.25 Susceptible ug/mL     Gentamicin <=2 Susceptible ug/mL     Levofloxacin <=0.5 Susceptible ug/mL     Meropenem <=0.5 Susceptible ug/mL     Nitrofurantoin* 64 Intermediate ug/mL      * Nitrofurantoin should only be used for the treatment of uncomplicated cystitis. [place] [facility] Antimicrobial Subcommittee June 2015.     Piperacillin/Tazobactam 4/4 Susceptible ug/mL     Tetracycline* <=2 Susceptible ug/mL      * Enterobacterales susceptible to tetracycline are also considered susceptible to doxycycline and minocycline. CLSI M100-ED33:2023     Trimethoprim/Sulfamethoxazole <=0.5/9.5 Susceptible ug/mL   Culture, Blood, Aerobic And Anaerobic    Collection Time: 11/02/23  1:15 PM    Specimen: Blood, Venous   Result Value    Culture Blood No growth at 5 days   Culture, Blood, Aerobic And Anaerobic    Collection Time: 11/02/23  1:31 PM    Specimen: Blood, Venous   Result Value    Culture Blood Growth of Gram negative rod (A)    Gram Stain Aerobic bottle positive for Gram negative rods (AA)   Microbiology Additional Request     Collection Time: 11/02/23  1:31 PM    Specimen: Blood, Venous   Result Value    Microbiology Additional Request For results, see separate report   Culture, Acid Fast Bacilli (AFB/Mycobacteria)(Component)    Collection Time: 11/02/23  2:21 PM    Specimen: Toe, Right; Swab   Result Value    Culture Acid Fast Bacillus (AFB) No growth at 1 week   Culture And Gram Stain, Aerobic Bacteria, Wound/Tissue/Fluid    Collection Time: 11/02/23  2:21 PM    Specimen: Toe, Right; Swab   Result Value    Culture Aerobic Bacteria Moderate growth of Pseudomonas aeruginosa (A)    Culture Aerobic Bacteria Moderate growth of Pseudomonas aeruginosa (A)    Culture Aerobic Bacteria Light growth of Staphylococcus lugdunensis (A)    Gram Stain Few WBCs (A)    Gram Stain No epithelial cells (A)    Gram Stain Moderate Gram positive cocci (A)       Susceptibility    Pseudomonas aeruginosa - MIC     Aztreonam 4 Susceptible ug/mL     Cefepime 2 Susceptible ug/mL     Ceftazidime <=2 Susceptible ug/mL     Ciprofloxacin <=0.25 Susceptible ug/mL     Levofloxacin <=0.5 Susceptible ug/mL     Meropenem <=0.5 Susceptible ug/mL     Piperacillin/Tazobactam 4/4  Susceptible ug/mL     Tobramycin <=2 Susceptible ug/mL    Pseudomonas aeruginosa - MIC     Aztreonam 8 Susceptible ug/mL     Cefepime 2 Susceptible ug/mL     Ceftazidime <=2 Susceptible ug/mL     Ciprofloxacin <=0.25 Susceptible ug/mL     Levofloxacin <=0.5 Susceptible ug/mL     Meropenem <=0.5 Susceptible ug/mL     Piperacillin/Tazobactam 4/4 Susceptible ug/mL     Tobramycin <=2 Susceptible ug/mL    Staphylococcus lugdunensis - MIC     Clindamycin <=0.5 Susceptible ug/mL     Erythromycin <=0.5 Susceptible ug/mL     Gentamicin <=2 Susceptible ug/mL     Levofloxacin <=1 Susceptible ug/mL     Oxacillin* <=0.25 Susceptible ug/mL      * Oxacillin sensitivity predicts sensitivity to cephalosporins. [place] Antimicrobial Subcommittee 2016     Rifampin <=0.5 Susceptible ug/mL     Tetracycline* <=0.5 Susceptible  ug/mL      * Staphylococcus spp. susceptible to tetracycline are also considered susceptible to doxycycline and minocycline. CLSI M100-ED33:2023     Vancomycin 1 Susceptible ug/mL   Culture, Fungal (Component)    Collection Time: 11/02/23  2:21 PM    Specimen: Toe, Right; Swab   Result Value    Culture Fungal No growth at 1 week   Fungal Stain (Component)    Collection Time: 11/02/23  2:21 PM    Specimen: Toe, Right; Swab   Result Value    Fungal Stain No fungal or yeast elements seen   Acid Fast Bacilli Stain (Component)    Collection Time: 11/02/23  2:21 PM    Specimen: Toe, Right; Swab   Result Value    Acid Fast Bacilli Stain No acid fast bacilli seen   Culture, Blood, Aerobic And Anaerobic    Collection Time: 11/04/23 11:01 AM    Specimen: Blood, Venous   Result Value    Culture Blood No growth at 5 days   Nares, MRSA (methicillin-resistant Staphylococcus aureus) Screening, PCR    Collection Time: 11/04/23  9:40 PM    Specimen: Nares; Swab   Result Value    MRSA (methicillin resistant Staphylococcus aureus) DNA Not Detected   Culture And Gram Stain, Aerobic Bacteria, Wound/Tissue/Fluid    Collection Time: 11/06/23 12:05 PM    Specimen: Foot, Right; Swab   Result Value    Culture Aerobic Bacteria Very light growth of Pseudomonas aeruginosa (A)    Gram Stain No squamous epithelial cells seen    Gram Stain Few WBCs    Gram Stain No organisms seen   Fungal Stain (Component)    Collection Time: 11/06/23 12:05 PM    Specimen: Foot, Right; Swab   Result Value    Fungal Stain No fungal or yeast elements seen   Acid Fast Bacilli Stain (Component)    Collection Time: 11/06/23 12:05 PM    Specimen: Foot, Right; Swab   Result Value    Acid Fast Bacilli Stain No acid fast bacilli seen   Microbiology Additional Request    Collection Time: 11/09/23  9:32 AM    Specimen: Debridement Tissue (Specify if applicable); Abscess   Result Value    Microbiology Additional Request For results, see separate report       Radiology:   US   Venous Duplex Doppler Leg Bilateral Limited  Result Date: 11/12/2023  1. Widely patent bilateral great and small saphenous veins, no evidence of superficial thrombosis. 2. Vein measurements as above. [place] J. Zimmermann, DO 11/12/2023 10:38 AM

## 2023-11-12 NOTE — Plan of Care (Incomplete)
 Shift Note: 10/28 PM     Orientation: AOx 4  Rhythm on tele: a fib w/ BBB  Oxygen: RA  Ambulation: SBA w/ crutckes or walker   Pain: 10/10 on RLE PRN oxy, dilaudid   Lines/Drips: 20G LFA, 20G RFA, 18G R wrist  GI/GU: chronic foley, bowel stress incontinence; LBM 10/28  Fall Score: MOD    Critical Labs/Imaging/Procedures:   10/18: wound cultures + PsA & staph lugdunsis  10/22: repeat wound Cx growing PsA  10/22: Right partial hallux amputation   10/25: Angio w/stent to right external iliac artery  10/27: RLE Angiogram  10/28 US : pre-procedure vein mapping, no evidence of superficial thrombosis  10/28 Fem-Pop Bypass  10/29 wound cx - Gram neg rod growth    Comments:    - see doc flow for complete assessment and vitals.   - Hep gtt restarted @ 2204  - BG 389; Dr. Tennie notified and aware; bedtime Insuline ordered and given.   - BG 390 @ 0453. Night hospitalist notified and aware; onetime dose of lispro 5 unit ordered.    Plan:   - Vitals Q4H   - Q4H NVC  - Monitor R groin site & R Hallux amputation  - IV Abx  - Pain management  - Per Urology OP cytoscopy  - Hep gtt; next anti xa will be @ 1200    Braden Score: 19

## 2023-11-12 NOTE — PT Progress Note (Signed)
 [facility]   Physical Therapy Cancellation Note      Patient:  Scott Monroe. MRN#:  [id number]  Unit:  [place] TOWER MAIN PREOPERATIVE Room/Bed:  FXMAINPREOP/FXMAINPREOP    11/12/2023  Time: 10:33 AM       PT Cancellation: Visit  PT Visit Cancellation Reason: Testing/Procedure         Camellia Martinez PT, DPT [phone]

## 2023-11-12 NOTE — Progress Notes (Signed)
 Vascular Surgery Progress Note  [id number]    Date Time: 11/12/23 9:27 AM  Patient Name: Scott Monroe.  Attending Vascular Physician: Dr. Marinus Salvage Day: 9     PROCEDURES   10/25: RLE angiogram with R EIA stent placement  10/27: RLE angiogram: high grade stenosis of the R SFA, occlusion of P2 and P3 segments of the R popliteal artery.     INTERVAL EVENTS       S/p above. No complaints this morning. Denies CP/SOB/new symptoms.   Pain controlled.   H/H 7.8 (9.4) WBC 7.4k (8.24)   Cr 1.9 (1.7)  Afebrile, HR 90-100s tachycardic overnight. BP stable, on room air.     ASSESSMENT    Tyge Erica Osuna. is a 78 y.o. male w/ PMHx DMII, HTN, HLD, a.fib on Eliquis , tobacco abuse (since 60s), neuropathy, bladder mass, chronic foley for urinary retention who presented on 10/20 with diabetic R great toe ulcer and GNR bacteremia, now s/p R partial hallux amputation (podiatry), RLE angiogram with R EIA stent placement (10/25) and repeat RLE angiogram with anterograde access demonstrating high grade stenosis of the R SFA and occlusions at P2-P3 of the R popliteal artery.     Planning for bypass RLE today.     PLAN   NPO now   OR today for RIGHT femoral to posterior tibial artery bypass with rGSV for limb salvage with Dr. Marinus.   Hold heparin  now. Ok to continue Plavix.   STAT Type and Screen. CBC. And 2U OR blood hold. Monitor Hgb drop.   Continue Zosyn per ID management. Transition to PO on discharge per ID recs when ready  Q4 NV check   Remainder of care per primary team. Dr. Darnelle, primary attending, updated of plan this morning.     IMAGING RESULTS   10/21: AD/NIUS: ABI/TBI R 0.34/0.14; L 0.27, 0.15; consistent with bilateral severe PAD, RLE- focal occlusion in the mid/distal pop with distal recon, 3v runoff; LLE-known iliac artery stenosis, occlusion of SFA w/ recon of the pop, 3v r/o    LABS       Hematology   Recent Labs     11/12/23  0800 11/11/23  0421   WBC 7.40 8.25   Hemoglobin 7.8* 9.4*    Hematocrit 24.2* 29.2*   Platelet Count 217 211        Coagulation   Recent Labs     11/09/23  1531   PTT 29       Chemistry   Recent Labs     11/12/23  0328 11/11/23  0926 11/10/23  0417   Sodium 137 139 137   Potassium 4.3 4.3 4.8   Chloride 107 110 111   CO2 21 20 21    BUN 29* 23 23   Creatinine 1.9* 1.7* 1.8*   Glucose 287* 158* 139*   Calcium  8.3 8.8 8.5        Liver Function Tests   No results for input(s): AST, ALT, ALKPHOS, PROT, ALB, BILITOTAL, BILIDIRECT, LIP, AMY, PREALB in the last 72 hours.          MEDICATIONS   Current Facility-Administered Medications[1]   sodium chloride  Stopped (11/11/23 1806)    [Held by provider] heparin  infusion 25,000 units/500 mL (Cardiac/Low Intensity) Stopped (11/12/23 0817)     sodium chloride , benzocaine-menthol, benzonatate, carboxymethylcellulose sodium, dextrose  **OR** dextrose  **OR** dextrose  **OR** glucagon  (rDNA), dextrose  **OR** dextrose  **OR** dextrose  **OR** glucagon  (rDNA), heparin  (porcine), magnesium  sulfate, melatonin, naloxone , oxyCODONE , oxyCODONE , potassium &  sodium phosphates , potassium chloride  **OR** potassium chloride  **OR** potassium chloride , saline     INPUT/OUTPUT     Patient Lines/Drains/Airways Status       Active Lines, Drains and Airways       Name Placement date Placement time Site Days    Peripheral IV 11/09/23 20 G Diffusion Anterior;Proximal;Right Forearm 11/09/23  1555  Forearm  2    Peripheral IV 11/09/23 18 G Standard Anterior;Left;Proximal Forearm 11/09/23  2018  Forearm  2    Urethral Catheter Non-latex;Straight-tip 16 Fr. 11/02/23  1112  Non-latex;Straight-tip  9                      REVIEW OF SYSTEMS   A complete review of systems was performed and was negative except for the pertinent positives documented in the HPI.     PHYSICAL EXAM    Current Vitals:   Vitals:    11/12/23 0817   BP: 144/79   Pulse: 90   Resp:    Temp:    SpO2:        Vital signs in the last 24hrs:   Temp:  [98.1 F (36.7 C)-99 F (37.2 C)]  98.1 F (36.7 C)  Heart Rate:  [86-137] 90  Resp Rate:  [16-20] 16  BP: (122-187)/(62-88) 144/79     General: NAD, resting in bed comfortably  Neuro: A&O x 3, speech clear and congruent, face symmetric, tongue midline  Cardio: RRR   Pulm: on room air, no audible wheezing, rhonchi.   Abd: soft, ND, NTTP     Extremities:    Warm bilateral LE  Motor intact b/l; sensation diminished to light touch at baseline  R foot wrapped with kerlix, no drainage  R groin access min surrounding ecchymosis, no swelling, soft, palp nodule  Pulse Exam:   RLE: mono DP/PT  LLE: monophasic DP/PT              [1]   Current Facility-Administered Medications   Medication Dose Route Frequency    acetaminophen   1,000 mg Oral TID    amLODIPine  10 mg Oral Daily    aspirin   81 mg Oral Daily    atorvastatin   80 mg Oral Daily    clopidogrel  75 mg Oral Daily    gabapentin   300 mg Oral TID    insulin  lispro  1-5 Units Subcutaneous TID AC    metoprolol  tartrate  25 mg Oral Q12H SCH    piperacillin-tazobactam  4.5 g Intravenous Q8H    RisaQuad  1 capsule Oral Daily    tamsulosin   0.4 mg Oral Daily after dinner    traZODone   50 mg Oral QHS

## 2023-11-12 NOTE — Plan of Care (Signed)
 Shift Note: 10/27 PM     Orientation: AOx 4  Rhythm on tele: Afib w/BBB  Oxygen: RA  Ambulation: SBA w/ Cane or Walker  Pain: RLE; PRN Oxy  Lines/Drips:  20G LFA; 20G Diff RFA  GI/GU: Chronic Foley; Bowel Stress Incontinence; LBM: 10/26  Fall Score: Moderate    Critical Labs/Imaging/Procedures:   10/18: wound cultures + PsA & staph lugdunsis  10/22: repeat wound Cx growing PsA  10/22: Right partial hallux amputation   10/25: Angio w/stent to right external iliac artery  10/27: RLE Angiogram       Comments:    - see doc flow for complete assessment and vitals.   - Per overnight Hospitalist A; no need for continuous IV Normal Saline if patient eating and drinking okay.   - Per vascular provider, okay to resume Heparin  gtt at previous dose.     Significant Shift Events and Questions for Attending:    Plan:   - Vitals Q4H   - Q4hr NVC/vitals  - monitor R groin & R hallux amputation sites  - pain mgt  - IV abx  - Fem-Pop intervention next week  - per Urology OP cystoscopy   - LIH gtt - next Anti-Xa @ 1200        (Braden Score: <=18 include skin assessment template)

## 2023-11-12 NOTE — Op Note (Signed)
 FULL OPERATIVE NOTE    Date Time: 11/12/23 3:54 PM  Patient Name: Scott Monroe, Scott Monroe (MRN: [id number])  Attending Physician: Darnelle Spire, MD      Date of Operation:   11/12/2023    Providers Performing:   Surgeons and Role:     DEWAINE Marinus Longest, MD - Primary     * Baig, Mirza Zain, MD - Resident - Assisting    Surgical First Assistant(s):   None    Operative Procedure:   CREATION, BYPASS, ARTERIAL, FEMORAL TO POSTERIOR POPLITEAL; REVERSE SAPHENOUS VEIN BYPASS:       Preoperative Diagnosis:   Pre-Op Diagnosis Codes:      * PAD (peripheral artery disease) [I73.9]    Postoperative Diagnosis:   Post-Op Diagnosis Codes:     * PAD (peripheral artery disease) [I73.9]    Anesthesia:   general    Findings:   Total occlusion of P2 and P3 segments of the right popliteal artery unable to cross in a patient with critical limb ischemia    Indications:   Critical limb ischemia right foot and lower extremity, status post right hallux amputation with inadequate flow to the foot for wound healing    Operative Notes:   Patient was brought to the operating room placed on the OR table and general anesthesia was induced.  Right leg was prepped and draped in standard fashion.  IV antibiotic was given.  Timeout was called.    Through a series of longitudinal incisions with intervening skin bridges we harvested the greater saphenous vein from the saphenofemoral junction in the groin down to below the knee.  On distention it was an excellent venous conduit measuring 4 to 4-1/2 mm at the upper end and 3-1/2 to 4 mm at the lower end.    The mid right superficial femoral artery was exposed and the subsartorial canal through the medially placed incision.  A segment of the vessel appropriate for the proximal anastomosis was isolated and controlled with Silastic Vesseloops.  Dissection of the vessel was soft with normal inflow.    Through a medially placed incision the attachment of the solea's to the posterior medial aspect of the tibia  was taken down and the posterior compartment of the leg was entered.  The posterior tibial artery and its proximal third was exposed and proximal distal control gained with Silastic Vesseloops.  Section of the artery for the distal anastomosis was soft without significant disease.    Patient was given 7000 units of heparin  and after allowing circulation time 3 minutes posterior tibial artery was temporarily occluded.  A longitudinal arteriotomy in the vessel made.  The lumen was widely patent.  Good backbleeding was noted.    The vein graft was reversed and the end spatulated.  An end-to-side anastomosis between the vein and the posterior tibial artery was accomplished with 6-0 Prolene suture under 2.5 X loupe magnification.  On completion of the anastomosis the vein graft was very carefully tunneled subcutaneously up to the exposed mid superficial femoral artery in the subsartorial canal.    The vessel was temporarily occluded a longitudinal arteriotomy in the femoral artery was made.  Inflow was excellent and the lumen was widely patent.    The vein graft was cut to length and the vein graft was spatulated.  An end-to-side anastomosis between the vein and the femoral artery was accomplished with 6-0 Prolene suture under 2.5 X loupe magnification.    Just a short distance of the suture line remaining to  be completed the system was backbled and forward flushed on completion of the suture line flow was established via the saphenous vein graft into the posterior tibial artery and beyond.  Excellent pulse was noted throughout including in the posterior tibial artery beyond the distal anastomosis.  This was confirmed using the intraoperative Doppler.    30 mg of protamine was given complete hemostasis in the wounds were noted.  Wounds were closed in layers with 2-0 and 3-0 Vicryl skin was closed with interrupted skin staples.  Sterile dressings placed procedure ended.  Blood loss 350 cc all sponge needle counts reported  correct.  Patient taken back to recovery room in good condition.              Estimated Blood Loss:   400 mL    Implants:   * No implants in log *       Drains:   Drains: no      Specimens:   * No specimens in log *      Complications:   None    Signed by: Janyce Ovens, MD   [facility]

## 2023-11-13 ENCOUNTER — Encounter: Payer: Self-pay | Admitting: Specialist

## 2023-11-13 LAB — WHOLE BLOOD GLUCOSE POCT
Whole Blood Glucose POCT: 390 mg/dL — ABNORMAL HIGH (ref 70–100)
Whole Blood Glucose POCT: 316 mg/dL — ABNORMAL HIGH (ref 70–100)
Whole Blood Glucose POCT: 186 mg/dL — ABNORMAL HIGH (ref 70–100)
Whole Blood Glucose POCT: 270 mg/dL — ABNORMAL HIGH (ref 70–100)

## 2023-11-13 LAB — CBC
Absolute nRBC: 0 x10 3/uL (ref <=0.00)
Hematocrit: 24.9 % — ABNORMAL LOW (ref 37.6–49.6)
Hemoglobin: 8.1 g/dL — ABNORMAL LOW (ref 12.5–17.1)
MCH: 27.6 pg (ref 25.1–33.5)
MCHC: 32.5 g/dL (ref 31.5–35.8)
MCV: 85 fL (ref 78.0–96.0)
MPV: 9.7 fL (ref 8.9–12.5)
Platelet Count: 193 x10 3/uL (ref 142–346)
RBC: 2.93 x10 6/uL — ABNORMAL LOW (ref 4.20–5.90)
RDW: 16 % — ABNORMAL HIGH (ref 11–15)
WBC: 7.52 x10 3/uL (ref 3.10–9.50)
nRBC %: 0 /100{WBCs} (ref <=0.0)

## 2023-11-13 LAB — BASIC METABOLIC PANEL
Anion Gap: 9 (ref 5.0–15.0)
BUN: 29 mg/dL — ABNORMAL HIGH (ref 9–28)
CO2: 19 meq/L (ref 17–29)
Calcium: 8.1 mg/dL (ref 7.9–10.2)
Chloride: 108 meq/L (ref 99–111)
Creatinine: 1.7 mg/dL — ABNORMAL HIGH (ref 0.5–1.5)
GFR: 41.5 mL/min/1.73 m2 — ABNORMAL LOW (ref >=60.0)
Glucose: 431 mg/dL — ABNORMAL HIGH (ref 70–100)
Potassium: 4.7 meq/L (ref 3.5–5.3)
Sodium: 136 meq/L (ref 135–145)

## 2023-11-13 LAB — ANTI-XA,UFH: Anti-Xa, UFH: 0.19 [IU]/mL

## 2023-11-13 MED ORDER — INSULIN LISPRO 100 UNIT/ML SOLN (WRAP)
5.0000 [IU] | Freq: Once | Status: AC
Start: 2023-11-13 — End: 2023-11-13
  Administered 2023-11-13: 5 [IU] via SUBCUTANEOUS
  Filled 2023-11-13: qty 15

## 2023-11-13 MED ORDER — APIXABAN 2.5 MG PO TABS
2.5000 mg | ORAL_TABLET | Freq: Two times a day (BID) | ORAL | Status: DC
Start: 2023-11-13 — End: 2023-11-14
  Administered 2023-11-13 – 2023-11-14 (×3): 2.5 mg via ORAL
  Filled 2023-11-13 (×3): qty 1

## 2023-11-13 NOTE — Progress Notes (Signed)
 PROGRESS NOTE    Date Time: 11/13/23 10:46 AM  Patient Name: Scott Monroe.                                  NEPHROLOGY PROGRESS NOTE    Follow up for AKI  Subjective:   No complaints    Medications:      Scheduled Meds: PRN Meds:    amLODIPine, 10 mg, Oral, Daily  apixaban , 2.5 mg, Oral, Q12H SCH  aspirin , 81 mg, Oral, Daily  atorvastatin , 80 mg, Oral, Daily  gabapentin , 300 mg, Oral, TID  insulin  lispro, 1-6 Units, Subcutaneous, QHS  insulin  lispro, 2-10 Units, Subcutaneous, TID AC  metoprolol  tartrate, 25 mg, Oral, Q12H SCH  piperacillin-tazobactam, 4.5 g, Intravenous, Q8H  RisaQuad, 1 capsule, Oral, Daily  tamsulosin , 0.4 mg, Oral, Daily after dinner  traZODone , 50 mg, Oral, QHS        Continuous Infusions:   sodium chloride  Stopped (11/11/23 1806)    sodium chloride , , Code/Trauma Continuous Med  acetaminophen , 1,000 mg, Q6H PRN  benzocaine-menthol, 1 lozenge, Q2H PRN  benzonatate, 100 mg, TID PRN  carboxymethylcellulose sodium, 1 drop, TID PRN  dextrose , 15 g of glucose, PRN   Or  dextrose , 12.5 g, PRN   Or  dextrose , 12.5 g, PRN   Or  glucagon  (rDNA), 1 mg, PRN  dextrose , 15 g of glucose, PRN   Or  dextrose , 12.5 g, PRN   Or  dextrose , 12.5 g, PRN  dextrose , 15 g of glucose, PRN   Or  dextrose , 12.5 g, PRN   Or  dextrose , 12.5 g, PRN   Or  glucagon  (rDNA), 1 mg, PRN  HYDROmorphone , 0.4 mg, Q2H PRN  magnesium  sulfate, 1 g, PRN  melatonin, 3 mg, QHS PRN  naloxone , 0.2 mg, PRN  oxyCODONE , 10 mg, Q6H PRN  oxyCODONE , 5 mg, Q6H PRN  oxyCODONE , 5 mg, Q6H PRN  potassium & sodium phosphates , 2 packet, PRN  potassium chloride , 0-60 mEq, PRN   Or  potassium chloride , 0-60 mEq, PRN   Or  potassium chloride , 10 mEq, PRN  saline, 2 spray, Q4H PRN          Review of Systems:    No complaints of chest pain, palpitations shortness of breath, cough, headaches, dizziness, joint pain, abdominal pain, nausea, decreased appetite, or fevers.     Physical Exam:   Patient Vitals for the past 24 hrs:   BP Temp Temp src Pulse  Resp SpO2 Weight   11/13/23 0818 131/60 97.8 F (36.6 C) Oral 91 16 97 % --   11/13/23 0354 154/72 97.6 F (36.4 C) Oral 96 16 98 % 56.2 kg (123 lb 14.4 oz)   11/13/23 0320 -- -- -- 85 -- -- --   11/12/23 2307 -- -- -- 99 -- -- --   11/12/23 2300 115/58 98 F (36.7 C) Oral (!) 101 16 98 % --   11/12/23 2213 -- -- -- 95 -- -- --   11/12/23 2100 139/71 98.1 F (36.7 C) Oral (!) 103 18 100 % --   11/12/23 2020 -- -- -- (!) 119 -- -- --   11/12/23 2011 162/74 97.8 F (36.6 C) Oral (!) 138 -- 100 % --   11/12/23 1915 -- -- -- -- -- -- 56.2 kg (123 lb 14.4 oz)   11/12/23 1821 166/72 97.6 F (36.4 C) Oral (!) 117 18 99 % --  11/12/23 1800 142/80 -- -- (!) 112 19 100 % --   11/12/23 1740 140/79 -- -- (!) 106 14 98 % --   11/12/23 1730 142/86 97.5 F (36.4 C) Axillary (!) 107 12 97 % --   11/12/23 1720 142/86 -- -- (!) 109 (!) 11 99 % --   11/12/23 1710 134/76 -- -- (!) 105 13 100 % --   11/12/23 1700 147/72 -- -- (!) 106 12 95 % --   11/12/23 1650 132/78 -- -- (!) 103 19 97 % --   11/12/23 1640 155/80 -- -- (!) 106 (!) 11 98 % --   11/12/23 1630 167/84 -- -- (!) 105 17 98 % --   11/12/23 1624 (!) 149/99 -- -- (!) 105 (!) 10 99 % --   11/12/23 1620 (!) 149/99 -- -- (!) 106 15 98 % --   11/12/23 1615 112/84 97 F (36.1 C) Temporal (!) 107 12 100 % --       Intake and Output Summary (Last 24 hours) at Date Time    Intake/Output Summary (Last 24 hours) at 11/13/2023 1046  Last data filed at 11/13/2023 0344  Gross per 24 hour   Intake 4250 ml   Output 2225 ml   Net 2025 ml       General appearance - alert, well appearing, and in no distress  Mental status - alert, oriented to person, place, and time  Chest - clear to auscultation, no wheezes, rales or rhonchi, symmetric air entry  Heart - S1 and S2 normal  Neurological - alert, normal speech  Extremities - no pedal edema noted    Labs:     Results for orders placed or performed during the hospital encounter of 11/04/23 (from the past 24 hours)    Collection Time:  11/12/23 11:08 AM   Result Value    Blood Type Confirmation A POS   Weak D, Reflex    Collection Time: 11/12/23 11:08 AM   Result Value    Weak D Typing POS    Collection Time: 11/12/23 11:12 AM   Result Value    Whole Blood Glucose POCT 147 (H)   Arterial blood gas w/ lytes, H&H, WB glucose, WB chloride    Collection Time: 11/12/23  2:34 PM   Result Value    Arterial pH 7.308 (L)    Arterial pCO2 36.3    Arterial pO2 258.0 (H)    Arterial HCO3 17.9 (L)    Arterial CO2 19.1 (L)    Arterial Base Excess -7.5 (L)    Arterial O2 Saturation 99.8 (H)    Whole Blood Potassium 4.5    Whole Blood Sodium 138    Calcium , Ionized 2.25    Whole Blood Chloride 112 (H)    Whole Blood Glucose 203 (H)    Hematocrit Total Calculated 26.0 (L)    Hemoglobin Total 8.4 (L)    Collection Site Art Line    Temperature 35.9    O2 Delivery     Tidal Volume     Mode     Pressure Control     ETCO2     Minute Ventilation     PEEP     Pressure Support     Collection Time: 11/12/23  4:24 PM   Result Value    Whole Blood Glucose POCT 192 (H)    Collection Time: 11/12/23  9:07 PM   Result Value    Whole Blood Glucose POCT 389 (H)  Collection Time: 11/13/23  3:46 AM   Result Value    Anti-Xa, UFH 0.19   CBC without Differential    Collection Time: 11/13/23  3:46 AM   Result Value    WBC 7.52    Hemoglobin 8.1 (L)    Hematocrit 24.9 (L)    Platelet Count 193    MPV 9.7    RBC 2.93 (L)    MCV 85.0    MCH 27.6    MCHC 32.5    RDW 16 (H)    nRBC % 0.0    Absolute nRBC 0.00   Basic Metabolic Panel    Collection Time: 11/13/23  3:46 AM   Result Value    Glucose 431 (H)    BUN 29 (H)    Creatinine 1.7 (H)    Calcium  8.1    Sodium 136    Potassium 4.7    Chloride 108    CO2 19    Anion Gap 9.0    GFR 41.5 (L)    Collection Time: 11/13/23  4:53 AM   Result Value    Whole Blood Glucose POCT 390 (H)    Collection Time: 11/13/23  7:15 AM   Result Value    Whole Blood Glucose POCT 316 (H)           Recent Labs   Lab 11/13/23  0346 11/12/23  0800 11/11/23  0421    WBC 7.52 7.40 8.25   RBC 2.93* 2.85* 3.40*   Hemoglobin 8.1* 7.8* 9.4*   Hematocrit 24.9* 24.2* 29.2*   MCV 85.0 84.9 85.9   MCHC 32.5 32.2 32.2   RDW 16* 15 15   MPV 9.7 9.7 9.5   Platelet Count 193 217 211         Recent Labs   Lab 11/13/23  0346 11/12/23  0328 11/11/23  0926 11/10/23  0417 11/09/23  0326   Sodium 136 137 139 137 134*   Potassium 4.7 4.3 4.3 4.8 5.2   Chloride 108 107 110 111 106   CO2 19 21 20 21 21    BUN 29* 29* 23 23 29*   Creatinine 1.7* 1.9* 1.7* 1.8* 1.6*   Glucose 431* 287* 158* 139* 181*   Calcium  8.1 8.3 8.8 8.5 8.9               Rads:   Radiological Procedure reviewed.    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 S/p IV contrast on 10/27. Creatinine improved. Monitor renal function     2) Hypertension: Adequate control with norvasc, lopressor     Plan:   Avoid nephrotoxins  Monitor renal function  Maintain BP less than 150/80      Travus Oren Aran, MD  [facility]  Epic Chat: [facility] [employer]  [facility], or [facility]  Spectra # [phone](after 430 PM, call the office number)  Pg# [phone]  Office # [phone]  After 430 PM, please call the office for person on call  On Weekends, call the office for the person on call

## 2023-11-13 NOTE — Progress Notes (Signed)
 [facility]  Internal Medicine Hospitalists  Progress Note        Assessment / Plan:        Scott Monroe. is a 78 y.o. male with diabetes, hypertension, hyperlipidemia, chronic HFpEF (grade 1) A-fib (on Eliquis ) with recent ED visit 11/02/2023 for right hallux osteomyelitis with overlying abscess who presented to the emergency room and was noted to have gram-negative bacteremia and UTI with + Ucx(now growing Klebsiella and E. coli).  Recent wound culture growing Pseudomonas and staph ludgenesis.     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 S/p angiogram 10/25.  Additional angiogram 10/27, bypass performed on 10/28. Will need PT again and resumption of AC and aspirin  before [place].      #Osteomyelitis of right lower extremity  - Wound Cx: Pseudomonas and staph lugdunesisi   - Status post right partial hallux amputation 10/22, 10/18 wound cultures --> PsA and Staph lugdunsis; repeat Wound Cx 10/22 PsA growing   - Angiogram 10/25 with stent placed, additional angiogram planned 10/27  - Pain control with scheduled acetaminophen , as needed oxycodone .  Bowel regimen while on opiates     #Gram-negative bacteremia  - Followed by ID most likely source CAUTI versus osteomyelitis  - F/u BCx 10/18. 10/20 BCx NGTD  - Continue with Zosyn monotherapy per ID while admitted.  On discharge transition to 2 weeks Levaquin 500 mg p.o. 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   -s/p angiogram 10/25: Dr. Priscille Op note reviewed   -S/p SFA to PT bypass on 10/28  - Resumed oral Eliquis  and aspirin  today, will monitor for another night  -PT/OT recommending home with  supervision     #Hyperlipidemia  - Continue with atorvastatin      #Insomnia  - Trazodone      #Acute on chronic renal failure  - Has not been following with specialists.  Transitional care referral has been placed  - Hold valsartan  in the setting of contrast studies, blood pressure stable  -Gentle IVF post angiogram procedure 10/25 and 10/27  - Avoid nephrotoxins      #Diabetes  -Most recent A1C 7.3% (11/04/2023)  -BG trend acceptable in hospital      #Neuropathy  - Gabapentin  dose adjusted in the setting of renal dysfunction per pharmacy  - Holding metformin  during admission  - Sliding scale insulin      #Chronic HFpEF and grade 1 DD -compensated currently. Monitor closely with IVF given for AKI post procedure   # Hypertension - controlled  # CAD  # A-fib  - Continue with atorvastatin   - Metoprolol  25 twice daily, amlodipine  - Holding valsartan  in the setting of renal failure  - Continue with aspirin   - Holding Eliquis .  Heparin  infusion per vascular surgery while admitted       VTE Prophylaxis: apixaban  (ELIQUIS ) tablet    apixaban  (ELIQUIS ) tablet 2.5 mg       Foley Catheter: Chronic urinary retention    Venous Access: No Temporary Central Line Present    Medical Readiness for Discharge: Anticipated in 2-4 Days    Open Handoff Activity in  Sidebar    Additional Diagnoses:                  Acute blood loss anemia requiring monitoring     Highest Value Lowest Value Decrease   Hemoglobin 12.3 g/dl  89/79/7974 88:98 AM 7.8 g/dl  89/71/7974  1:99 AM -4.5 g/dl   Hematocrit 61.8%  89/79/7974 11:01 AM  24.2%  11/12/2023  8:00 AM -13.9%           Subjective:      Ambulated with PT this morning, has some pain in right lower extremity which is Has no other complaints, denies any chest pain, shortness of breath       Objective:      Temp:  [97 F (36.1 C)-98.4 F (36.9 C)] 98.4 F (36.9 C)  Heart Rate:  [85-138] 90  Resp Rate:  [10-19] 15  BP: (112-167)/(58-99) 138/62   Constitutional - resting comfortably, chronically  ill-appearing, thin  CV - no loud murmurs, no significant peripheral edema  Resp - CTA bilaterally  GI - soft, non-tender, non-distended  GU-Foley catheter in place  MSK-right lower extremity postoperative wound clean dry and intact  Skin - Some bruising noted in right side of pelvis  Neuro - alert, aware, interactive  Psych - appropriate affect

## 2023-11-13 NOTE — PT Progress Note (Signed)
 Physical Therapy Treatment Scott Monroe.  FI350/FI350-01   POST ACUTE CARE THERAPY RECOMMENDATIONS:      Discharge Recommendations:  Home with supervision    DME needs IF patient is discharging home: Patient already has needed equipment    Therapy discharge recommendations may change with patient status.  Please refer to most recent note for up-to-date recommendations.     ASSESSMENT:     Significant Findings: none    Patient amenable to physical therapy treatment session. Patient performed most functional mobility with SBA, ambulating 20ft in hallway with bilateral axillary crutches. Patient able to maintain heel weight bearing on RLE with post op shoe. Patient will continue to benefit from inpatient PT services to maximize functional mobility and independence.    Treatment Activities: gait training, therapeutic exercise    Educated the patient to role of physical therapy, plan of care, goals of therapy and safety with mobility and ADLs, weight bearing precautions.     PLAN:   Treatment/Interventions: Exercise, Gait training, Stair training, Neuromuscular re-education, Functional transfer training, LE strengthening/ROM, Endurance training, Patient/family training, Equipment eval/education, Bed mobility    PT Frequency: 3-4x/wk    Continue plan of care.       Precautions and Contraindications:   Falls  Weight Bearing: Right LE Heel weight bearing with post op shoe     SUBJECTIVE: Thanks for coming by   Patient's medical condition is appropriate for Physical Therapy intervention at this time.  Patient is agreeable to participation in the therapy session. Nursing clears patient for therapy.    Pain Assessment:  No report of pain     OBJECTIVE:   Observation of Patient:   Patient with telemetry, peripheral IV, foley catheter in place.    Functional Mobility:  PMP - Progressive Mobility Protocol   PMP Activity: Step 7 - Walks out of Room  Distance Walked (ft) (Step 6,7): 50 Feet    Bed Mobility:     Supine to  Sit: Stand-by assistance    Transfers:  Sit to Stand: Stand-by assistance  Stand to Sit: Stand-by assistance    Ambulation:  Distance: 50 Feet  Device: Crutches  Assist Level: Stand-by assistance  Pattern: decreased pace, decreased cadence    Balance:  Static sitting: good   Dynamic sitting: good   Static standing: good   Dynamic standing: fair     Patient Participation: good  Patient Endurance: fair    Patient left with call bell within reach, all needs met, and all questions answered. RN notified of session outcome and patient response.    SCD's off (as found)  Floor mat in place  Chair alarm in place    Goals:  Goals  Time for Goal Acheivement: 5 visits  Pt Will Perform Sit to Stand: Goal met  Pt Will Transfer Bed/Chair: with rolling walker, modified independent  Pt Will Ambulate: > 200 feet, with rolling walker, with supervision    Time of Treatment  PT Received On: 11/13/23  Start Time: 0800  Stop Time: 0830  Time Calculation (min): 30 min  Treatment # 2 out of 5 visits    Scott Monroe, PT, DPT

## 2023-11-13 NOTE — Progress Notes (Addendum)
 11/13/23 0952   CM Review   CM Comments 10/29: EDD - Thursday  bacteremia, s/p bypass  IV Abx - switch to PO for d/c, Pain mgmt, PO eliquis    Dispo - Home w/ SN/OT/PT - pac order placed  transport - daughter     @1314  - ICCB referral placed     CM will continue to monitor patient's progression of care, d/c needs and possible barriers to discharge.       Jovita Almas, MSW, LCSW   Case Manager I Care Management Department   [facility]   [address] [place], [place] [address]   Phone: [phone]  [email or web address]

## 2023-11-13 NOTE — Plan of Care (Incomplete)
 Shift Note: 10/29 PM     Orientation: AOx 4  Rhythm on tele: Afib w/BBB  Oxygen: RA  Ambulation: SBA w/crutches  Pain: 8/10 RLE  Lines/Drips: 18G LAC, 20G RFA, 18G RFA   GI/GU: Cont x2; LBM: 10/28  Fall Score: Moderate    Critical Labs/Imaging/Procedures:   10/18: wound cultures + PsA & staph lugdunsis  10/22: repeat wound Cx growing PsA  10/22: Right partial hallux amputation   10/25: Angio w/stent to right external iliac artery  10/27: RLE Angiogram  10/28 US : pre-procedure vein mapping, no evidence of superficial thrombosis  10/28 Fem-Pop Bypass    Comments:    - see doc flow for complete assessment and vitals.     Significant Shift Events and Questions for Attending:    Plan:   - Vitals Q4H   - NVC Q4H  - Monitor RLE incisions, dressing takedown tomorrow 10/30  - IV abx  - PO eliquis   - Poss  tomorrow 10/30    (Braden Score: <=18 include skin assessment template)

## 2023-11-13 NOTE — Progress Notes (Signed)
 Vascular Surgery Progress Note  [id number]    Date Time: 11/13/23 6:14 AM  Patient Name: Scott Monroe.  Attending Vascular Physician: Dr. Marinus Salvage Day: 10     PROCEDURES   10/25: RLE angiogram with R EIA stent placement  10/27: RLE angiogram: high grade stenosis of the R SFA, occlusion of P2 and P3 segments of the R popliteal artery.  10/28 R fem-PT bypass w/ rGSV    INTERVAL EVENTS       S/p R fem- PT bypass w/ rGSV yesterday  Reports some bleeding from incision site overnight, which was rewrapped and subsequently improved  Pain controlled  AF, VSS     ASSESSMENT    Dennys Guin. is a 78 y.o. male w/ PMHx DMII, HTN, HLD, a.fib on Eliquis , tobacco abuse (since 1960s), neuropathy, bladder mass, chronic foley for urinary retention who presented on 10/20 with diabetic R great toe ulcer and GNR bacteremia, now s/p R partial hallux amputation (podiatry), RLE angiogram with R EIA stent placement (10/25) and repeat RLE angiogram with anterograde access demonstrating high grade stenosis of the R SFA and occlusions at P2-P3 of the R popliteal artery. Now s/p R fem-PT bypass w/ rGSV.    PLAN     OOB today  Continue heparin  gtt, plavix  Abx per ID  Dressings down POD2  Q4 NV check   Remainder of care per primary team. Dr. Darnelle, primary attending, updated of plan this morning.    Plan discussed with Dr. Marinus.    Please reach out with any questions or concerns.    IMAGING RESULTS   10/21: AD/NIUS: ABI/TBI R 0.34/0.14; L 0.27, 0.15; consistent with bilateral severe PAD, RLE- focal occlusion in the mid/distal pop with distal recon, 3v runoff; LLE-known iliac artery stenosis, occlusion of SFA w/ recon of the pop, 3v r/o    LABS       Hematology   Recent Labs     11/13/23  0346 11/12/23  0800 11/11/23  0421   WBC 7.52 7.40 8.25   Hemoglobin 8.1* 7.8* 9.4*   Hematocrit 24.9* 24.2* 29.2*   Platelet Count 193 217 211        Coagulation   No results for input(s): PT, INR, PTT in the last 72  hours.      Chemistry   Recent Labs     11/13/23  0346 11/12/23  0328 11/11/23  0926   Sodium 136 137 139   Potassium 4.7 4.3 4.3   Chloride 108 107 110   CO2 19 21 20    BUN 29* 29* 23   Creatinine 1.7* 1.9* 1.7*   Glucose 431* 287* 158*   Calcium  8.1 8.3 8.8        Liver Function Tests   No results for input(s): AST, ALT, ALKPHOS, PROT, ALB, BILITOTAL, BILIDIRECT, LIP, AMY, PREALB in the last 72 hours.          MEDICATIONS   Current Facility-Administered Medications[1]   sodium chloride  Stopped (11/11/23 1806)    heparin  infusion 25,000 units/500 mL (Cardiac/Low Intensity) 21 Units/kg/hr (11/13/23 0428)     sodium chloride , acetaminophen , benzocaine-menthol, benzonatate, carboxymethylcellulose sodium, dextrose  **OR** dextrose  **OR** dextrose  **OR** glucagon  (rDNA), dextrose  **OR** dextrose  **OR** dextrose  **OR** [DISCONTINUED] glucagon  (rDNA), dextrose  **OR** dextrose  **OR** dextrose  **OR** glucagon  (rDNA), heparin  (porcine), HYDROmorphone , magnesium  sulfate, melatonin, naloxone , oxyCODONE , oxyCODONE , oxyCODONE , potassium & sodium phosphates , potassium chloride  **OR** potassium chloride  **OR** potassium chloride , saline     INPUT/OUTPUT  Patient Lines/Drains/Airways Status       Active Lines, Drains and Airways       Name Placement date Placement time Site Days    Peripheral IV 11/09/23 20 G Diffusion Anterior;Proximal;Right Forearm 11/09/23  1555  Forearm  3    Peripheral IV 11/09/23 18 G Standard Anterior;Left;Proximal Forearm 11/09/23  2018  Forearm  3    Peripheral IV 11/12/23 18 G Right Wrist 11/12/23  1218  Wrist  less than 1    Urethral Catheter Non-latex;Straight-tip 16 Fr. 11/02/23  1112  Non-latex;Straight-tip  10                      REVIEW OF SYSTEMS   A complete review of systems was performed and was negative except for the pertinent positives documented in the HPI.     PHYSICAL EXAM    Current Vitals:   Vitals:    11/13/23 0354   BP: 154/72   Pulse: 96   Resp: 16   Temp: 97.6  F (36.4 C)   SpO2: 98%       Vital signs in the last 24hrs:   Temp:  [97 F (36.1 C)-98.1 F (36.7 C)] 97.6 F (36.4 C)  Heart Rate:  [85-138] 96  Resp Rate:  [10-19] 16  BP: (112-167)/(58-99) 154/72     General: NAD, resting in bed comfortably  Neuro: A&O x 3, speech clear and congruent, face symmetric, tongue midline  Cardio: RRR   Pulm: on room air, no audible wheezing, rhonchi.   Abd: soft, ND, NTTP     Extremities:    Warm bilateral LE  Motor intact b/l; sensation diminished to light touch at baseline  R foot wrapped with kerlix, no drainage  R groin access min surrounding ecchymosis, no swelling, soft, palp nodule  Pulse Exam:   RLE: mono DP/PT  LLE: monophasic DP/PT    RLE wrapped w/ ACE wrap w/ minimal strikethrough     Excellent revascularization of Rt.foot by Femoral to PT reversed SVBG.       [1]   Current Facility-Administered Medications   Medication Dose Route Frequency    amLODIPine  10 mg Oral Daily    aspirin   81 mg Oral Daily    atorvastatin   80 mg Oral Daily    clopidogrel  75 mg Oral Daily    gabapentin   300 mg Oral TID    insulin  lispro  1-6 Units Subcutaneous QHS    insulin  lispro  2-10 Units Subcutaneous TID AC    metoprolol  tartrate  25 mg Oral Q12H SCH    piperacillin-tazobactam  4.5 g Intravenous Q8H    RisaQuad  1 capsule Oral Daily    tamsulosin   0.4 mg Oral Daily after dinner    traZODone   50 mg Oral QHS

## 2023-11-13 NOTE — Plan of Care (Signed)
 Shift Note: 10/29 AM     Orientation: AOx 4  Rhythm on tele: Afib  Oxygen: RA  Ambulation: SBA w/ crutches  Pain: Mod-Sev RLE pain, PRN oxy  Lines/Drips: 18G LAC, 20G RFA, 18G RFA  GI/GU: Cont x2, LBM: 10/28  Fall Score: Moderate    Critical Labs/Imaging/Procedures:   10/18: wound cultures + PsA & staph lugdunsis  10/22: repeat wound Cx growing PsA  10/22: Right partial hallux amputation   10/25: Angio w/stent to right external iliac artery  10/27: RLE Angiogram  10/28 US : pre-procedure vein mapping, no evidence of superficial thrombosis  10/28 Fem-Pop Bypass    Comments:    - see doc flow for complete assessment and vitals.       Plan:   - Vitals Q4H   - NVC Q4H  - Monitor RLE incisions, dressing takedown tomorrow 10/30  - IV abx  - PO eliquis   - Poss [place] tomorrow 10/30    Braden Score: 19

## 2023-11-13 NOTE — Consults (Signed)
 [facility] Note  Home Health Referral    Referral from Blair Mighty  (Case Manager) for home health care upon discharge.    By [employer] systems, the patient has the right to freely choose a home care provider.    A company of the patients choosing. We have supplied the patient with a listing of providers in your area who asked to be included and participate in Medicare.   [facility] a home care agency that provides adult home care services and participates in Medicare   The preferred provider of your insurance company. Choosing a home care provider other than your insurance company's preferred provider may affect your insurance coverage.    Home Health Discharge Information    Your doctor has ordered Skilled Nursing, Physical Therapy, and Occupational Therapy in-home service(s) for you while you recuperate at home, to assist you in the transition from hospital to home.    The agency that you or your representative chose to provide the service:  Name of Home Health Agency Placement: [facility]]  Phone: [phone] Resumption of care    The above services were set up by:  Hadassah Marien Drone, RN (Post Acute Care Coordinator)     IF YOU HAVE NOT HEARD FROM YOUR HOME HEALTH AGENCY WITHIN 24-48 HOURS AFTER DISCHARGE PLEASE CALL YOUR AGENCY TO ARRANGE A TIME FOR YOUR FIRST VISIT. FOR ANY SCHEDULING CONCERNS OR QUESTIONS RELATED TO HOME HEALTH, SUCH AS TIME OR DATE PLEASE CONTACT YOUR HOME HEALTH AGENCY AT THE NUMBER LISTED ABOVE.    Additional comments:  Demographics  Patient Last Name: Monroe   Patient First Name: Scott  Language/Communication Barrier: no  Service Address: [address]  [place] [address]   Service Home Phone: [phone] (home)   Other phone numbers:    Telephone Information:   Mobile [phone]     Emergency Contact: Extended Emergency Contact Information  Primary Emergency Contact: [facility]  Mobile Phone: [phone]  Relation:  Daughter  Preferred language: English    Admission Information  Admit Date: 11/04/2023  Patient Status at discharge: Inpatient  Admitting Diagnosis: Bacteremia [R78.81]        Home Health face-to-face (FTF) Encounter (Order [id number])  Consult  Date: 11/13/2023 Department[facility] Ordering/Authorizing: Darnelle Spire, MD     Order Information    Order Date/Time Release Date/Time Start Date/Time End Date/Time   11/13/23 01:20 PM None 11/13/23 01:21 PM 11/13/23 01:21 PM     Order Details    Frequency Duration Priority Order Class   Once 1  occurrence Routine Hospital Performed     Standing Order Information    Remaining Occurrences Interval Last Released     0/1 Once 11/13/2023              Provider Information    Ordering User Ordering Provider Authorizing Provider   Drone Hadassah Marien, RN Darnelle Spire, MD Darnelle Spire, MD   Attending Provider(s) Admitting Provider PCP   Cammie Chiquita BRAVO, MD; Coleman Roberto RAMAN, MD; Pascal Essie LABOR, MD; Kingston Ronal PARAS, MD; Dennise Ates, MD; Eve Maryjane BIRCH, DO; Ardis Scott DEL, MD; Darnelle Spire, MD Coleman Roberto RAMAN, MD Pcp, None, MD     Verbal Order Info    Action Created on Order Mode Entered by Responsible Provider Signed by Signed on   Ordering 11/13/23 1320 Telephone with readback Drone Hadassah Marien, RN Darnelle Spire, MD  Comments    Reinforce/teach foley care per protocol    Home nursing required for skilled assessment including cardiopulmonary assessment and dietary education for disease management, and medication instruction. Home PT/OT required for gait and balance training, strengthening, mobility, fall prevention, and ADL training.     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 S/p angiogram 10/25.  Additional angiogram 10/27, bypass performed on 10/28. Will need PT again and resumption of AC and aspirin  before [place].     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  -s/p angiogram 10/25: Dr. Priscille Op note reviewed  -S/p SFA to PT bypass on 10/28  #Hyperlipidemia  #Insomnia  #Acute on chronic renal failure  #Diabetes  #Neuropathy  #Chronic HFpEF  # Hypertension - controlled  # CAD  # A-fib                Home Health face-to-face (FTF) Encounter: Patient Communication     Not Released  Not seen         Order Questions    Question Answer   Date I saw the patient face-to-face: 11/13/2023   Evidence this patient is homebound because: B.  Profound weakness, poor balance/unsteady gait d/t illness/treatment/procedure    C.  Decreased endurance, strength, ROM, cadence, safety/judgment during mobility    F.  Deconditioned due to advance disease process requiring assistance to leave home    G.  Fall risk due to impaired coordination, gait and decreased balance    I.  Restricted to home to decrease risk of infection    N.  Impaired mobility d/t pain, arthritis, weakness that compromises patient safety   Medical conditions that necessitate Home Health care: B.  Functional impairment due to recent hospitalization/procedure/treatment    C.  Risk for complication/infection/pain requiring follow up and monitoring    D.  Chronic illness & risk for re-hospitalization due to unstable disease status    E.  Exacerbation of disease requiring follow up monitoring    F.  New diagnosis &  treatment requiring follow up monitoring and management    G.  High risk/complex medications requiring instruction and management    H.  Multiple new medications requiring management and monitoring    I.  Tube/drainage/ostomy requiring ongoing care and teaching   Per clinical findings, following services are medically necessary: Skilled Nursing    PT    OT   Clinical findings that support the need for Skilled Nursing. SN will: C. Monitor for signs and symptoms of exacerbation of disease and management    D. Review medication reconciliation, manage and educate on use and side effects    G. Educate on new diagnosis, treatment & management to prevent re-hospitalization    H. Assess cardiopulmonary status and monitor for signs &symptoms of exacerbation    I.  Educate dietary and or fluid  restrictions and weight management    J.  Instruct on ongoing care and maintenance for tube/drain/ostomy   Clinical findings that support the need for Physical Therapy. PT will A.  Evaluate and treat functional impairment and improve mobility    C.  Educate on weight bearing status, stair/gait training, balance & coordination    D.  Provide services to help restore function, mobility, and releive pain    E.  Educate on functional mobility; bed, chair, sit, stand and transfer activities    F.  Perform home safety assessment & develop safe in home exercise program    G.  Implement activities to improve stance time, cadence & step length    H.  Educate on the safe use of assistive device/ durable medical equipment    I.  Instruct on restorative activities to restore ability to perform ADL   OT will provide assistance with: Home program to improve ability to perform ADLs    Restorative program to improve mobility and independence    Strategies to compensate for loss of function    Recovery and maintenance skills    Basic motor function and reasoning abilities                    Process Instructions    Please select Home Care Services  medically necessary.    Based on the above findings, I certify that this patient is confined to the home and needs intermittent skilled nursing care, physical therapry and / or speech therapy or continues to need occupational therapy. The patient is under my care, and I have initiated the establishment of the plan of care. This patient will be followed by a physician who will periodically review the plan of care.    Collection Information            Consult Order Info    ID Description Priority Start Date Start Time   [id number] Home Health face-to-face (FTF) Encounter Routine 11/13/2023  1:21 PM   Provider Specialty Referred to   ______________________________________ _____________________________________                         Verbal Order Info    Action Created on Order Mode Entered by Responsible Provider Signed by Signed on   Ordering 11/13/23 1320 Telephone with readback Antoine, Hadassah Nightingale, RN Darnelle Spire, MD             Patient Information    Patient Name  Scott Monroe. Legal Sex  Male DOB  July 28, 1945       Reprint Order Requisition    Home Health face-to-face (FTF) Encounter (Order #[id number]) on 11/13/23       Additional Information    Associated Reports External References   Priority and Order Details [employer]

## 2023-11-13 NOTE — Plan of Care (Signed)
 Office: [phone]  Epic GroupChat: [facility] (ID[facility])    Date Time: 11/13/23 @NOW   Patient Name: Scott Monroe, Scott Monroe.      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  Approximately 3 cc of purulent drainage expressed, drainage was cultured.   - cx Ps ae susc P and Staph lugdunensis  - XRAY 10/18  erosive/destructive change with fragmentation in the distal  tuft and shaft of the distal phalanx, right great toe  - partial hallux amputation 10/22  - OR culture growing PsA (S all tested agents)  - OR path pending     Bacteremia GNR  - blood cx 10/18 GNR 1 of 2 sets (could not be identified)     Bladder Mass  Foley in place since August 2025; exchanged Oct18  - urine cx Oct 18 >100 K K pna (R ampicillin) and > 100 K E coli susc (I cefuroxime)      Interval Events/Subjective:   10/25: RLE angiogram with R EIA stent placement  10/27: RLE angiogram: high grade stenosis of the R SFA, occlusion of P2 and P3 segments of the R popliteal artery.  10/28 R fem-PT bypass w/ rGSV    Antimicrobials:   #6 Piperacillin-tazobactam    D/c  #2 vanc IV  Estimated Creatinine Clearance: 29.8 mL/min (A) (based on SCr of 1.7 mg/dL (H)).      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  10/28 R fem-PT bypass w/ rGSV     Irregular bladder wall thickening identified Aug 2025 along with hydronephrosis and hydroureter  - may be playing a role in bacteremia     Plan:   Asked lab to identify GNR in blood from Oct 18  2 weeks of levofloxacin 500 mg po q48H + cephalexin 500 mg p q12H  - EOT 11/5  should cover organisms identified in urine and foot  (assuming blood gnr either from urine or from wound)  Needs Urology follow up    ID will sign off  Call again as needed    Lines:     Patient Lines/Drains/Airways Status       Active PICC Line / CVC Line / PIV Line / Drain / Airway / Intraosseous Line / Epidural Line / ART Line / Line / Wound / Pressure Ulcer / NG/OG Tube       Name Placement date Placement time Site Days    Peripheral IV 11/06/23 Left Forearm 11/06/23  1709  Forearm  less than 1  Urethral Catheter Non-latex;Straight-tip 16 Fr. 11/02/23  1112  Non-latex;Straight-tip  4                    *I have performed a risk-benefit analysis and the patient needs a central line for access and IV medications      Review of Systems:   Detailed 10 system review was done; pertinent positives and negatives listed in HPI, otherwise negative in details.      Physical Exam:     Vitals:    11/13/23 1123   BP: 138/62   Pulse: 90   Resp: 15   Temp: 98.4 F (36.9 C)   SpO2: 99%           Family History:   Family History[1]    Social History:   Social History[2]    Allergies:   Allergies[3]    Labs:   Available labs reviewed from EMR, independently interpreted    Microbiology:   All data reviewed and pertinent info summarized above, independently interpeted  Rads:   No results found.      Signed by: Avelina Dawley, MD               [1] No family history on file.  [2]   Social History  Socioeconomic History    Marital status: Divorced   Tobacco Use    Smoking status: Every Day     Current  packs/day: 1.00     Types: Cigarettes    Smokeless tobacco: Never   Vaping Use    Vaping status: Never Used   Substance and Sexual Activity    Alcohol use: Never    Drug use: Never     Social Drivers of Psychologist, [employer] Strain: High Risk (11/04/2023)    Overall Financial Resource Strain (CARDIA)     Difficulty of Paying Living Expenses: Hard   Food Insecurity: No Food Insecurity (11/04/2023)    Hunger Vital Sign     Worried About Running Out of Food in the Last Year: Never true     Ran Out of Food in the Last Year: Never true   Recent Concern: Food Insecurity - Food Insecurity Present (08/29/2023)    Received from [facility]    Hunger Vital Sign     Within the past 12 months, you worried that your food would run out before you got the money to buy more.: Sometimes true     Within the past 12 months, the food you bought just didn't last and you didn't have money to get more.: Sometimes true   Transportation Needs: No Transportation Needs (11/04/2023)    PRAPARE - Therapist, Art (Medical): No     Lack of Transportation (Non-Medical): No   Recent Concern: Transportation Needs - Unmet Transportation Needs (08/29/2023)    Received from [facility]    [facility] - Transportation     In the past 12 months, has lack of transportation kept you from medical appointments or from getting medications?: Yes     In the past 12 months, has lack of transportation kept you from meetings, work, or from getting things needed for daily living?: Yes   Physical Activity: Inactive (04/22/2021)    Received from [facility]    Exercise Vital Sign     On average, how many days per week do you engage in moderate to strenuous exercise (like  a brisk walk)?: 0 days     On average, how many minutes do you engage in exercise at this level?: 0 min   Stress: No Stress Concern Present (01/15/2022)    Received from [facility]    [facility] of Occupational Health - Occupational Stress Questionnaire     Feeling of Stress : Only a little   Social Connections: Socially Isolated (01/15/2022)    Received from [facility]    Social Connection and Isolation Panel     In a typical week, how many times do you talk on the phone with family, friends, or neighbors?: More than three times a week     How often do you get together with friends or relatives?: Never     How often do you attend church or religious services?: Never     Do you belong to any clubs or organizations such as church groups, unions, fraternal or athletic groups, or school groups?: No     How often do you attend meetings of the clubs or organizations you belong to?: Never     Are you married, widowed, divorced, separated, never married, or living with a partner?: Separated   Intimate Partner Violence: Not At Risk (11/04/2023)    Humiliation, Afraid, Rape, and Kick questionnaire     Fear of Current or Ex-Partner: No     Emotionally Abused: No     Physically Abused: No     Sexually Abused: No   Housing Stability: Not At Risk (11/04/2023)    Housing Stability NCSS     Do you have housing?: Yes     Are you worried about losing your housing?: No   [3]   Allergies  Allergen Reactions    Strawberry C [Ascorbate] Hives    Strawberry Extract Hives

## 2023-11-13 NOTE — OT Progress Note (Addendum)
 [facility]   Occupational Therapy Cancellation Note      Patient:  Scott Monroe. MRN#:  [id number]  [facility]  [facility] Room/Bed:  FI350/FI350-01    11/13/2023  Time: 12:59 PM       OT Cancellation: Visit  OT Visit Cancellation Reason: Patient/caregiver declines therapy at this time. Patient eating lunch at this time, politely requested OT to follow up later this afternoon. OT to follow up when schedule permits.   Attempted patient second time at 15:15, defer OT. Patient reported having no concerns regarding performing ADLs. Patient reported family will assist at home and declined to participate in OT at this time. Patient requesting for OT to follow up on Friday. OT to follow up when schedule permits.       Waddell Leach, OT  Available via Secure Epic Chat

## 2023-11-14 DIAGNOSIS — I739 Peripheral vascular disease, unspecified: Secondary | ICD-10-CM

## 2023-11-14 LAB — BASIC METABOLIC PANEL
Anion Gap: 6 (ref 5.0–15.0)
BUN: 24 mg/dL (ref 9–28)
CO2: 22 meq/L (ref 17–29)
Calcium: 8.5 mg/dL (ref 7.9–10.2)
Chloride: 110 meq/L (ref 99–111)
Creatinine: 1.7 mg/dL — ABNORMAL HIGH (ref 0.5–1.5)
GFR: 41.5 mL/min/1.73 m2 — ABNORMAL LOW (ref >=60.0)
Glucose: 147 mg/dL — ABNORMAL HIGH (ref 70–100)
Potassium: 4.6 meq/L (ref 3.5–5.3)
Sodium: 138 meq/L (ref 135–145)

## 2023-11-14 LAB — CBC
Absolute nRBC: 0 x10 3/uL (ref <=0.00)
Hematocrit: 25.3 % — ABNORMAL LOW (ref 37.6–49.6)
Hemoglobin: 8.1 g/dL — ABNORMAL LOW (ref 12.5–17.1)
MCH: 27.9 pg (ref 25.1–33.5)
MCHC: 32 g/dL (ref 31.5–35.8)
MCV: 87.2 fL (ref 78.0–96.0)
MPV: 9.5 fL (ref 8.9–12.5)
Platelet Count: 198 x10 3/uL (ref 142–346)
RBC: 2.9 x10 6/uL — ABNORMAL LOW (ref 4.20–5.90)
RDW: 16 % — ABNORMAL HIGH (ref 11–15)
WBC: 7.36 x10 3/uL (ref 3.10–9.50)
nRBC %: 0 /100{WBCs} (ref <=0.0)

## 2023-11-14 LAB — WHOLE BLOOD GLUCOSE POCT
Whole Blood Glucose POCT: 150 mg/dL — ABNORMAL HIGH (ref 70–100)
Whole Blood Glucose POCT: 158 mg/dL — ABNORMAL HIGH (ref 70–100)
Whole Blood Glucose POCT: 163 mg/dL — ABNORMAL HIGH (ref 70–100)
Whole Blood Glucose POCT: 175 mg/dL — ABNORMAL HIGH (ref 70–100)
Whole Blood Glucose POCT: 269 mg/dL — ABNORMAL HIGH (ref 70–100)

## 2023-11-14 MED ORDER — OXYCODONE HCL 10 MG PO TABS
10.0000 mg | ORAL_TABLET | Freq: Four times a day (QID) | ORAL | 0 refills | Status: AC | PRN
Start: 2023-11-14 — End: 2023-11-21

## 2023-11-14 MED ORDER — LEVOFLOXACIN 500 MG PO TABS
500.0000 mg | ORAL_TABLET | ORAL | 0 refills | Status: DC
Start: 2023-11-14 — End: 2023-11-14

## 2023-11-14 MED ORDER — LEVOFLOXACIN 500 MG PO TABS
500.0000 mg | ORAL_TABLET | ORAL | 0 refills | Status: AC
Start: 2023-11-14 — End: 2023-11-20

## 2023-11-14 MED ORDER — AMLODIPINE BESYLATE 10 MG PO TABS
10.0000 mg | ORAL_TABLET | Freq: Every day | ORAL | 0 refills | Status: DC
Start: 2023-11-15 — End: 2023-12-04

## 2023-11-14 MED ORDER — APIXABAN 2.5 MG PO TABS
2.5000 mg | ORAL_TABLET | Freq: Two times a day (BID) | ORAL | 0 refills | Status: DC
Start: 2023-11-14 — End: 2023-11-14

## 2023-11-14 MED ORDER — APIXABAN 2.5 MG PO TABS
2.5000 mg | ORAL_TABLET | Freq: Two times a day (BID) | ORAL | 0 refills | Status: DC
Start: 2023-11-14 — End: 2023-12-09

## 2023-11-14 MED ORDER — CEPHALEXIN 500 MG PO CAPS
500.0000 mg | ORAL_CAPSULE | Freq: Two times a day (BID) | ORAL | 0 refills | Status: DC
Start: 2023-11-14 — End: 2023-11-14

## 2023-11-14 MED ORDER — AMLODIPINE BESYLATE 10 MG PO TABS
10.0000 mg | ORAL_TABLET | Freq: Every day | ORAL | 0 refills | Status: DC
Start: 2023-11-15 — End: 2023-11-14

## 2023-11-14 MED ORDER — OXYCODONE HCL 10 MG PO TABS
10.0000 mg | ORAL_TABLET | Freq: Four times a day (QID) | ORAL | 0 refills | Status: DC | PRN
Start: 2023-11-14 — End: 2023-11-14

## 2023-11-14 MED ORDER — CEPHALEXIN 500 MG PO CAPS
500.0000 mg | ORAL_CAPSULE | Freq: Two times a day (BID) | ORAL | 0 refills | Status: AC
Start: 2023-11-14 — End: 2023-11-20

## 2023-11-14 NOTE — Plan of Care (Signed)
 Shift Note: 10/30 AM     Orientation: AOx 4  Rhythm on tele: No tele  Oxygen: RA  Ambulation: SBA w/ crutches  Pain: Mod-Sev RLE pain, PRN oxy  Lines/Drips: 18G LAC, 20G RFA, 18G RFA  GI/GU: Cont x2, LBM: 10/28  Fall Score: Moderate     Critical Labs/Imaging/Procedures:   10/18: wound cultures + PsA & staph lugdunsis  10/22: repeat wound Cx growing PsA  10/22: Right partial hallux amputation   10/25: Angio w/stent to right external iliac artery  10/27: RLE Angiogram  10/28 US : pre-procedure vein mapping, no evidence of superficial thrombosis  10/28 Fem-Pop Bypass     Comments:    - see doc flow for complete assessment and vitals.         Plan:   - Vitals Q4H   - NVC Q4H  - Monitor RLE incisions  - IV abx -> PO on [place]  - PO eliquis   - [place] home today 10/30     Braden Score: 19

## 2023-11-14 NOTE — Progress Notes (Signed)
 Foot & Ankle Surgery Progress Note  Spectra  [phone] / Pager [phone]    Date Time: 11/14/23 12:11 AM  Patient Name: Scott Monroe, [name]  Consulting Physician: Rea Agent, [facility] Day: 11      Assessment:   Scott Monroe. is a 78 y.o. male with PMH T2DM (A1c 7.78 2 months ago), HTN, HLD, Afib on Eliquis , smoker, who presents for R hallux OM.  Admitted by medicine for IV antibiotics and further workup of bacteremia 2/2 UTI versus right hallux OM on 11/04/2023. Right hallux wound with seropurulent drainage and probe to bone concerning for osteomyelitis prior to amputation.     Operative Procedures:  10/18 Right hallux bedside I&D prior to discharge from ED  10/22 Right hallux bedside partial hallux amputation, closed primarily   10/25 RLE diagnosti angio  10/27 RLE angio recannulization of R superficial femoral artery  10/28 RLE fem-pop bypass    Vitals  - AF, HR 87 -101 other VSS     Labs  - BMP pending, CBC not ordered     Micro/path  - 10/18 Right hallux bedside wcx: MG Pseudomonas aeruginosa, LG Staphylococcus lugdunensis, MG GPC (final)  - 10/18 bcx: GNR (final)  -10/20 repeat bcx: NGTD (final)   - 10/22 IntraOp Right hallux cx: VLG Pseudomonas aeruginosa (final)  - 10/22 Right hallux distal phalanx Path: Acute OM    Imaging  - 10/18 Right hallux XR: Distal phalanx osteomyelitis   - 10/18 NIUS R ABI 0.34, TBI 0.14, L ABI 0.27 TBI 0.15     Plan:   - No further surgical plans and no barriers to discharge from podiatry perspective.   - POD 8 s/p 10/22 right partial hallux amputation closed primarily.  - Dressings changed this a.m. incision appears stable skin well coapted and sutures intact with no signs of clinical infection  - ABX per ID: Zosyn  - Final ID recommendations for discharge is cephalexin and levofloxacin EOT 11/5 and requires urology follow-up  - Heel Weight Bearing to the right lower extremity  - DVT ppx per vascular: Resumed Eliquis  yesterday  - Wound Care: Xeroform, 4x4 gauze,  Kerlix, and Webril to be managed by podiatry  - Will discuss plan with attending, Dr. Rea Agent, DPM,  and continue to follow    Rosco Maize, DPM  Resident - Foot and Ankle Surgery   [facility] - PGY-1  [phone] [facility] On-Call Pager)     Subjective:   Interval History:     Was seen at bedside today resting comfortably in bed. No unanticipated adverse events overnight.  Denies nausea, vomiting, fever, chills.    Review of Symptoms:   As per HPI, stated above.    Physical Exam:   BP 134/64   Pulse 87   Temp 98.6 F (37 C) (Oral)   Resp 16   Ht 1.727 m (5' 8)   Wt 56.2 kg (123 lb 14.4 oz)   SpO2 97%   BMI 18.84 kg/m     Gen: NAD, alert and oriented x3  Heart: normal rate  Lung: non-labored, symmetrical chest rise, no audible wheezing  Abdomen: non-distended, soft and non-tender to palpation    Lower Extremity Exam:  Derm:  - Right: Incision at the partial hallux amputation site skin well coapted, sutures intact, no signs of clinical infection noted  - Left: No open lesions, no interdigital maceration, no clinical signs of infection       Vasc:  - Right: Cap refill less  than 4 seconds  - Left: Pulses nonpalpable     Neuro:  - Right: Gross sensation intact. Gross motor function intact.  - Left: Gross sensation intact. Gross motor function intact.     MSK:  - Right: compartments soft, nontender, compressible. s/p partial hallux amputation  - Left: No gross deformities, compartments soft, nontender, compressible    Labs:     Recent Labs   Lab 11/13/23  0346 11/12/23  0800 11/12/23  0328 11/11/23  0926 11/11/23  0421 11/10/23  0417 11/09/23  0326   WBC 7.52 7.40  --   --  8.25  --  8.32   RBC 2.93* 2.85*  --   --  3.40*  --  3.30*   Hemoglobin 8.1* 7.8*  --   --  9.4*  --  9.2*   Hematocrit 24.9* 24.2*  --   --  29.2*  --  28.5*   Glucose 431*  --  287* 158*  --  139* 181*   BUN 29*  --  29* 23  --  23 29*   Creatinine 1.7*  --  1.9* 1.7*  --  1.8* 1.6*   Calcium  8.1  --  8.3 8.8  --  8.5 8.9   Sodium  136  --  137 139  --  137 134*   Potassium 4.7  --  4.3 4.3  --  4.8 5.2   Chloride 108  --  107 110  --  111 106   CO2 19  --  21 20  --  21 21       Microbiology:     Recent Results (from the past 360 hours)   Culture, Urine    Collection Time: 11/02/23 12:14 PM    Specimen: Urine, Clean Catch   Result Value    Culture Urine >100,000 CFU/mL Klebsiella pneumoniae (A)    Culture Urine >100,000 CFU/mL Escherichia coli (A)       Susceptibility    Escherichia coli - MIC     Amoxicillin/Clavulanic acid <=4/2 Susceptible ug/mL     Ampicillin* <=4 Susceptible ug/mL      * If oral therapy is desired AND ampicillin is susceptible, consider amoxicillin. [place] Antimicrobial Subcommittee Nov. 2020     Ampicillin/Sulbactam 2/1 Susceptible ug/mL     Aztreonam <=2 Susceptible ug/mL     Cefazolin* <=1 Susceptible ug/mL      * Cefazolin interpretation is for uncomplicated UTI only. If oral therapy is desired for uncomplicated UTI AND E.coli is susceptible to cefazolin, consider cephalexin, cefdinir, cefpodoxime, or cefprozil. [place] Antimicrobial Subcommittee Nov. 2020     Cefepime <=1 Susceptible ug/mL     Cefoxitin <=4 Susceptible ug/mL     Ceftazidime <=2 Susceptible ug/mL     Ceftriaxone* <=1 Susceptible ug/mL      * Ceftriaxone does not predict cefdinir susceptibility. [place] Antimicrobial Subcommittee Nov. 2020     Cefuroxime 8 Intermediate ug/mL     Ciprofloxacin <=0.25 Susceptible ug/mL     Ertapenem <=0.25 Susceptible ug/mL     Gentamicin <=2 Susceptible ug/mL     Levofloxacin <=0.5 Susceptible ug/mL     Meropenem <=0.5 Susceptible ug/mL     Nitrofurantoin* <=16 Susceptible ug/mL      * Nitrofurantoin should only be used for the treatment of uncomplicated cystitis. [place] Antimicrobial Subcommittee June 2015.     Piperacillin/Tazobactam <=2/4 Susceptible ug/mL     Tetracycline* <=2 Susceptible ug/mL      * Enterobacterales susceptible to tetracycline  are also considered susceptible to doxycycline and minocycline. CLSI  M100-ED33:2023     Trimethoprim/Sulfamethoxazole <=0.5/9.5 Susceptible ug/mL    Klebsiella pneumoniae - MIC     Amoxicillin/Clavulanic acid <=4/2 Susceptible ug/mL     Ampicillin* >16 Resistant ug/mL      * If oral therapy is desired AND ampicillin is susceptible, consider amoxicillin. [place] Antimicrobial Subcommittee Nov. 2020     Ampicillin/Sulbactam 8/4 Susceptible ug/mL     Aztreonam <=2 Susceptible ug/mL     Cefazolin* <=1 Susceptible ug/mL      * Cefazolin interpretation is for uncomplicated UTI only. If oral therapy is desired for uncomplicated UTI AND K. pneumoniae is susceptible to cefazolin, consider cephalexin, cefdinir, cefpodoxime, or cefprozil. [place] Antimicrobial Subcommittee Nov. 2020     Cefepime <=1 Susceptible ug/mL     Cefoxitin <=4 Susceptible ug/mL     Ceftazidime <=2 Susceptible ug/mL     Ceftriaxone* <=1 Susceptible ug/mL      * Ceftriaxone does not predict cefdinir susceptibility. [place] Antimicrobial Subcommittee Nov. 2020     Cefuroxime <=4 Susceptible ug/mL     Ciprofloxacin <=0.25 Susceptible ug/mL     Ertapenem <=0.25 Susceptible ug/mL     Gentamicin <=2 Susceptible ug/mL     Levofloxacin <=0.5 Susceptible ug/mL     Meropenem <=0.5 Susceptible ug/mL     Nitrofurantoin* 64 Intermediate ug/mL      * Nitrofurantoin should only be used for the treatment of uncomplicated cystitis. [place] Antimicrobial Subcommittee June 2015.     Piperacillin/Tazobactam 4/4 Susceptible ug/mL     Tetracycline* <=2 Susceptible ug/mL      * Enterobacterales susceptible to tetracycline are also considered susceptible to doxycycline and minocycline. CLSI M100-ED33:2023     Trimethoprim/Sulfamethoxazole <=0.5/9.5 Susceptible ug/mL   Culture, Blood, Aerobic And Anaerobic    Collection Time: 11/02/23  1:15 PM    Specimen: Blood, Venous   Result Value    Culture Blood No growth at 5 days   Culture, Blood, Aerobic And Anaerobic    Collection Time: 11/02/23  1:31 PM    Specimen: Blood, Venous   Result Value     Culture Blood Growth of Gram negative rod (A)    Gram Stain Aerobic bottle positive for Gram negative rods (AA)   Microbiology Additional Request    Collection Time: 11/02/23  1:31 PM    Specimen: Blood, Venous   Result Value    Microbiology Additional Request For results, see separate report   Organism Referred for Identification, Aerobic Bacteria    Collection Time: 11/02/23  1:31 PM    Specimen: Blood, Venous   Result Value    Organism Refer for ID, Aerobic Bact See Below   Culture, Acid Fast Bacilli (AFB/Mycobacteria)(Component)    Collection Time: 11/02/23  2:21 PM    Specimen: Toe, Right; Swab   Result Value    Culture Acid Fast Bacillus (AFB) No growth at 1 week   Culture And Gram Stain, Aerobic Bacteria, Wound/Tissue/Fluid    Collection Time: 11/02/23  2:21 PM    Specimen: Toe, Right; Swab   Result Value    Culture Aerobic Bacteria Moderate growth of Pseudomonas aeruginosa (A)    Culture Aerobic Bacteria Moderate growth of Pseudomonas aeruginosa (A)    Culture Aerobic Bacteria Light growth of Staphylococcus lugdunensis (A)    Gram Stain Few WBCs (A)    Gram Stain No epithelial cells (A)    Gram Stain Moderate Gram positive cocci (A)       Susceptibility  Pseudomonas aeruginosa - MIC     Aztreonam 4 Susceptible ug/mL     Cefepime 2 Susceptible ug/mL     Ceftazidime <=2 Susceptible ug/mL     Ciprofloxacin <=0.25 Susceptible ug/mL     Levofloxacin <=0.5 Susceptible ug/mL     Meropenem <=0.5 Susceptible ug/mL     Piperacillin/Tazobactam 4/4 Susceptible ug/mL     Tobramycin <=2 Susceptible ug/mL    Pseudomonas aeruginosa - MIC     Aztreonam 8 Susceptible ug/mL     Cefepime 2 Susceptible ug/mL     Ceftazidime <=2 Susceptible ug/mL     Ciprofloxacin <=0.25 Susceptible ug/mL     Levofloxacin <=0.5 Susceptible ug/mL     Meropenem <=0.5 Susceptible ug/mL     Piperacillin/Tazobactam 4/4 Susceptible ug/mL     Tobramycin <=2 Susceptible ug/mL    Staphylococcus lugdunensis - MIC     Clindamycin <=0.5 Susceptible ug/mL      Erythromycin <=0.5 Susceptible ug/mL     Gentamicin <=2 Susceptible ug/mL     Levofloxacin <=1 Susceptible ug/mL     Oxacillin* <=0.25 Susceptible ug/mL      * Oxacillin sensitivity predicts sensitivity to cephalosporins. [place] Antimicrobial Subcommittee 2016     Rifampin <=0.5 Susceptible ug/mL     Tetracycline* <=0.5 Susceptible ug/mL      * Staphylococcus spp. susceptible to tetracycline are also considered susceptible to doxycycline and minocycline. CLSI M100-ED33:2023     Vancomycin 1 Susceptible ug/mL   Culture, Fungal (Component)    Collection Time: 11/02/23  2:21 PM    Specimen: Toe, Right; Swab   Result Value    Culture Fungal No growth at 1 week   Fungal Stain (Component)    Collection Time: 11/02/23  2:21 PM    Specimen: Toe, Right; Swab   Result Value    Fungal Stain No fungal or yeast elements seen   Acid Fast Bacilli Stain (Component)    Collection Time: 11/02/23  2:21 PM    Specimen: Toe, Right; Swab   Result Value    Acid Fast Bacilli Stain No acid fast bacilli seen   Culture, Blood, Aerobic And Anaerobic    Collection Time: 11/04/23 11:01 AM    Specimen: Blood, Venous   Result Value    Culture Blood No growth at 5 days   Nares, MRSA (methicillin-resistant Staphylococcus aureus) Screening, PCR    Collection Time: 11/04/23  9:40 PM    Specimen: Nares; Swab   Result Value    MRSA (methicillin resistant Staphylococcus aureus) DNA Not Detected   Culture, Acid Fast Bacilli (AFB/Mycobacteria)(Component)    Collection Time: 11/06/23 12:05 PM    Specimen: Foot, Right; Swab   Result Value    Culture Acid Fast Bacillus (AFB) No growth at 1 week   Culture, Fungal (Component)    Collection Time: 11/06/23 12:05 PM    Specimen: Foot, Right; Swab   Result Value    Culture Fungal No growth at 1 week   Culture And Gram Stain, Aerobic Bacteria, Wound/Tissue/Fluid    Collection Time: 11/06/23 12:05 PM    Specimen: Foot, Right; Swab   Result Value    Culture Aerobic Bacteria Very light growth of Pseudomonas  aeruginosa (A)    Gram Stain No squamous epithelial cells seen    Gram Stain Few WBCs    Gram Stain No organisms seen   Fungal Stain (Component)    Collection Time: 11/06/23 12:05 PM    Specimen: Foot, Right; Swab   Result Value    Fungal  Stain No fungal or yeast elements seen   Acid Fast Bacilli Stain (Component)    Collection Time: 11/06/23 12:05 PM    Specimen: Foot, Right; Swab   Result Value    Acid Fast Bacilli Stain No acid fast bacilli seen   Microbiology Additional Request    Collection Time: 11/09/23  9:32 AM    Specimen: Debridement Tissue (Specify if applicable); Abscess   Result Value    Microbiology Additional Request For results, see separate report       Radiology:   No results found.

## 2023-11-14 NOTE — Discharge Summary -  Nursing (Signed)
 Discharge Note:     Discharge location: Home    Discharge instructions, follow up, and medications reviewed and explained to patient at bedside - questions answered stated understanding.     Prescriptions: Sent to [facility]    IV and tele removed. All belongings with patient. VSS.   Pt transported off of unit via wheelchair at 1630 PM.

## 2023-11-14 NOTE — Anesthesia Postprocedure Evaluation (Signed)
 Anesthesia Post Evaluation    Patient: Scott Monroe.    CREATION, BYPASS, ARTERIAL, FEMORAL TO POSTERIOR POPLITEAL; REVERSE SAPHENOUS VEIN BYPASS (Right)    Anesthesia type: general    Last Vitals:   Vitals Value Taken Time   BP 142/80 11/12/23 18:00   Temp 36.4 C (97.5 F) 11/12/23 17:30   Pulse 112 11/12/23 18:00   Resp 19 11/12/23 18:00   SpO2 100 % 11/12/23 18:00                 Anesthesia Post Evaluation:     Patient Evaluated: PACU  Patient Participation: complete - patient participated  Level of Consciousness: awake and alert  Pain Score: 0  Pain Management: adequate  Multimodal analgesia pain management approach  Strategies: acetaminophen  and local anesthesia  Airway Patency: patent  Two or more mitigation strategies used for obstructive sleep apnea.  Strategies: awake extubation, multimodal analgesia and extubation/recovery carried out in nonsupine position    Anesthetic complications: No      PONV Status: none    Cardiovascular status: acceptable  Respiratory status: acceptable  Hydration status: acceptable        Signed by: Ripley Leek, MD, 11/14/2023 8:15 AM

## 2023-11-14 NOTE — Progress Notes (Signed)
 11/14/23 1003   Medicare Checklist   Is this a Medicare patient? Yes   Discharge Disposition   Patient preference/choice provided? Yes   Physical Discharge Disposition Home, Home Health  [facility] confirmed - HH SN/PT/OT)   Name of Home Health Agency Placement [facility]   Mode of Transportation Car  (pts family to pick up at time of d/c)   Patient/Family/POA notified of transfer plan Yes   Patient agreeable to discharge plan/expected d/c date? Yes   Family/POA agreeable to discharge plan/expected d/c date? Yes   Bedside nurse notified of transport plan? Yes   CM Interventions   Follow up appointment scheduled?(For PNA, COPD, MI) Yes  (See AVS -  Nov 3 Initial Evaluation 1:40 PM  Arrive by 1:25 PM)   Referral made for home health RN visit? Yes   Multidisciplinary rounds/family meeting before d/c? Yes         Jovita Almas, MSW, LCSW   Case Manager I Care Management Department   [facility]   [address] [place], [place] [address]   Phone: [phone]  [email or web address]

## 2023-11-14 NOTE — Progress Notes (Signed)
 Vascular Surgery Progress Note  [id number]    Date Time: 11/14/23 6:45 AM  Patient Name: Scott Monroe.  Attending Vascular Physician: Dr. Marinus Salvage Day: 11     PROCEDURES   10/25: RLE angiogram with R EIA stent placement  10/27: RLE angiogram: high grade stenosis of the R SFA, occlusion of P2 and P3 segments of the R popliteal artery.  10/28 R fem-PT bypass w/ rGSV    INTERVAL EVENTS       C/o calf pain, but overall improvements in pain. 3/10 average. Ambulated with PT yesterday and recommended home with supervision by PT.   AF, VSS   On Eliquis , ASA 81 mg   Hgb 8.1 (7.8), Cr 1.7 (1.9)    ASSESSMENT    Scott Monroe. is a 78 y.o. male w/ PMHx DMII, HTN, HLD, a.fib on Eliquis , tobacco abuse (since 1960s), neuropathy, bladder mass, chronic foley for urinary retention who presented on 10/20 with diabetic R great toe ulcer and GNR bacteremia, now s/p R partial hallux amputation (podiatry), RLE angiogram with R EIA stent placement (10/25) and repeat RLE angiogram with anterograde access demonstrating high grade stenosis of the R SFA and occlusions at P2-P3 of the R popliteal artery.     POD#2 s/p R fem-PT bypass w/ rGSV. Doing well.   Improved perfusion to the right foot with multiphasic signals.   Surgical sites appear well on dressing removal.     PLAN     No further vascular surgery interventions planned.   Surgical sites can be left open to air or dressed for comfort with daily changes. Please keep groin site dry; may use dry gauze if needed.   Continue Eliquis  5 mg BID and ASA 81 mg daily.   Encourage OOB/ambulation   Q4NV checks   PT - home with supervision.   Stable for discharge from vascular surgery perspective with outpatient follow up with Dr. Marinus in the vascular surgery clinic in 2 weeks for wound check. AVS updated. Please call if any questions or concerns in the interim.     IMAGING RESULTS   10/21: AD/NIUS: ABI/TBI R 0.34/0.14; L 0.27, 0.15; consistent with bilateral  severe PAD, RLE- focal occlusion in the mid/distal pop with distal recon, 3v runoff; LLE-known iliac artery stenosis, occlusion of SFA w/ recon of the pop, 3v r/o    LABS       Hematology   Recent Labs     11/13/23  0346 11/12/23  0800   WBC 7.52 7.40   Hemoglobin 8.1* 7.8*   Hematocrit 24.9* 24.2*   Platelet Count 193 217        Coagulation   No results for input(s): PT, INR, PTT in the last 72 hours.      Chemistry   Recent Labs     11/13/23  0346 11/12/23  0328 11/11/23  0926   Sodium 136 137 139   Potassium 4.7 4.3 4.3   Chloride 108 107 110   CO2 19 21 20    BUN 29* 29* 23   Creatinine 1.7* 1.9* 1.7*   Glucose 431* 287* 158*   Calcium  8.1 8.3 8.8        Liver Function Tests   No results for input(s): AST, ALT, ALKPHOS, PROT, ALB, BILITOTAL, BILIDIRECT, LIP, AMY, PREALB in the last 72 hours.          MEDICATIONS   Current Facility-Administered Medications[1]   sodium chloride  Stopped (11/11/23 1806)  sodium chloride , acetaminophen , benzocaine-menthol, benzonatate, carboxymethylcellulose sodium, dextrose  **OR** dextrose  **OR** dextrose  **OR** glucagon  (rDNA), dextrose  **OR** dextrose  **OR** dextrose  **OR** [DISCONTINUED] glucagon  (rDNA), dextrose  **OR** dextrose  **OR** dextrose  **OR** glucagon  (rDNA), HYDROmorphone , magnesium  sulfate, melatonin, naloxone , oxyCODONE , oxyCODONE , oxyCODONE , potassium & sodium phosphates , potassium chloride  **OR** potassium chloride  **OR** potassium chloride , saline     INPUT/OUTPUT     Patient Lines/Drains/Airways Status       Active Lines, Drains and Airways       Name Placement date Placement time Site Days    Peripheral IV 11/09/23 20 G Diffusion Anterior;Proximal;Right Forearm 11/09/23  1555  Forearm  4    Peripheral IV 11/09/23 18 G Standard Anterior;Left;Proximal Forearm 11/09/23  2018  Forearm  4    Peripheral IV 11/12/23 18 G Right Wrist 11/12/23  1218  Wrist  1    Urethral Catheter Non-latex;Straight-tip 16 Fr. 11/02/23  1112   Non-latex;Straight-tip  11                      REVIEW OF SYSTEMS   A complete review of systems was performed and was negative except for the pertinent positives documented in the HPI.     PHYSICAL EXAM    Current Vitals:   Vitals:    11/14/23 0428   BP: 141/67   Pulse: 90   Resp: 16   Temp: 99.1 F (37.3 C)   SpO2: 98%       Vital signs in the last 24hrs:   Temp:  [97.8 F (36.6 C)-99.6 F (37.6 C)] 99.1 F (37.3 C)  Heart Rate:  [87-104] 90  Resp Rate:  [15-16] 16  BP: (131-148)/(60-68) 141/67     General: NAD, resting in bed comfortably  Neuro: A&O x 3, speech clear and congruent, face symmetric, tongue midline  Cardio: RRR   Pulm: on room air, no audible wheezing, rhonchi.   Abd: soft, ND, NTTP   Extremities:    Warm bilateral LE  Motor intact b/l; sensation diminished to light touch at baseline  R foot wrapped with kerlix, no drainage  Pulse Exam:   RLE: multiphasic DP/PT  LLE: monophasic DP/PT  RLE wrapped w/ ACE wrap w/ minimal strikethrough; removed to reveal staples to be intact on incisions on the right groin, right medial thigh, right medial lower leg.          [1]   Current Facility-Administered Medications   Medication Dose Route Frequency    amLODIPine  10 mg Oral Daily    apixaban   2.5 mg Oral Q12H [facility]    aspirin   81 mg Oral Daily    atorvastatin   80 mg Oral Daily    gabapentin   300 mg Oral TID    insulin  lispro  1-6 Units Subcutaneous QHS    insulin  lispro  2-10 Units Subcutaneous TID AC    metoprolol  tartrate  25 mg Oral Q12H SCH    piperacillin-tazobactam  4.5 g Intravenous Q8H    RisaQuad  1 capsule Oral Daily    tamsulosin   0.4 mg Oral Daily after dinner    traZODone   50 mg Oral QHS

## 2023-11-15 NOTE — Discharge Summary (Signed)
 Requesting final auth to be faxed to [phone] or call [phone]        Auth Number :  [id number]    Discharge Date -  11/14/2023  4:23 PM    IP ADMIT DATE: 11/04/2023          Medication List        START taking these medications      amLODIPine 10 MG tablet  Commonly known as: NORVASC  Take 1 tablet (10 mg) by mouth once daily     levoFLOXacin 500 MG tablet  Commonly known as: LEVAQUIN  Take 1 tablet (500 mg) by mouth every other day for 6 days     oxyCODONE  10 MG immediate release tablet  Commonly known as: ROXICODONE   Take 1 tablet (10 mg) by mouth every 6 (six) hours as needed for Pain            CHANGE how you take these medications      apixaban  2.5 MG tablet  Commonly known as: ELIQUIS   Take 1 tablet (2.5 mg) by mouth every 12 (twelve) hours  What changed:   medication strength  how much to take  when to take this     atorvastatin  80 MG tablet  Commonly known as: LIPITOR  Take 1 tablet (80 mg) by mouth nightly  What changed: Another medication with the same name was removed. Continue taking this medication, and follow the directions you see here.     cephALEXin 500 MG capsule  Commonly known as: KEFLEX  Take 1 capsule (500 mg) by mouth 2 (two) times daily for 6 days  What changed: when to take this     gabapentin  300 MG capsule  Commonly known as: NEURONTIN   Take 2 capsules (600 mg) by mouth 3 (three) times daily  What changed: Another medication with the same name was removed. Continue taking this medication, and follow the directions you see here.            CONTINUE taking these medications      aspirin  EC 81 MG EC tablet     BD Pen Needle Micro U/F 32G X 6 MM Misc  Generic drug: Insulin  Pen Needle  Use to inject insulin  into the skin 2 times per day. Ok to substitute with needle length and/or gauge of choice.     furosemide  40 MG tablet  Commonly known as: LASIX   Take 0.5 tablets (20 mg) by mouth as needed (if you gain 2 lbs in 24h or 5 lbs in 5 days)     insulin  NPH-regular 70-30 MIXTURE (70-30)  100 UNIT/ML pen-injector  Commonly known as: HumuLIN  70/30  Inject 30 units with breakfast and 20 units with dinner     metFORMIN  1000 MG tablet  Commonly known as: GLUCOPHAGE      metoprolol  tartrate 25 MG tablet  Commonly known as: LOPRESSOR   Take 1 tablet (25 mg) by mouth 2 (two) times daily Hold for blood pressure lower than 100/60     pantoprazole  40 MG tablet  Commonly known as: PROTONIX   Take 1 tablet (40 mg) by mouth every morning before breakfast     tamsulosin  0.4 MG Caps  Commonly known as: FLOMAX   Take 1 capsule (0.4 mg) by mouth Daily after dinner     traZODone  50 MG tablet  Commonly known as: DESYREL   Take 1 tablet (50 mg) by mouth nightly            STOP taking these medications  metoprolol  succinate XL 25 MG 24 hr tablet  Commonly known as: TOPROL -XL     sulfamethoxazole-trimethoprim 800-160 MG per tablet  Commonly known as: BACTRIM DS     valsartan  40 MG tablet  Commonly known as: DIOVAN                Where to Get Your Medications        [facility] [address], [place] - [address]  [address] [place] [address]      Phone: [phone]   amLODIPine 10 MG tablet  apixaban  2.5 MG tablet  cephALEXin 500 MG capsule  levoFLOXacin 500 MG tablet  oxyCODONE  10 MG immediate release tablet             NAME: Scott Wesley Swickard Jr.             MR#: [id number]      Patient Address:  [address]  [place] [address]    Patient phone: [phone] (home)     PATIENT NAME: Scott Monroe, Scott Monroe.  DOB: Oct 09, 1945   PMH:  has a past medical history of Hyperlipidemia, Hypertension, Insomnia, Irregular heart beat, Restless leg, and Sleep apnea.  PSH:  has a past surgical history that includes Back surgery; Spinal cord stimulator implant; Arterial-  Lower Extremity Angiography Poss PTA (Right, 11/09/2023); and FEMORAL-POPLITEAL BYPASS (Right, 11/12/2023).      DIAGNOSIS:     ICD-10-CM    1. Bacteremia  R78.81 oxyCODONE  (ROXICODONE ) 10  MG immediate release tablet     DISCONTINUED: oxyCODONE  (ROXICODONE ) 10 MG immediate release tablet      2. PAD (peripheral artery disease)  I73.9 Arterial-  Lower Extremity Angiography Poss PTA     Arterial-  Lower Extremity Angiography Poss PTA      3. Coronary artery disease, unspecified vessel or lesion type, unspecified whether angina present, unspecified whether native or transplanted heart  I25.10 Arterial-  Lower Extremity Angiography Poss PTA     Arterial-  Lower Extremity Angiography Poss PTA     CANCELED: Arterial-  Lower Extremity Angiography Poss PTA     CANCELED: Arterial-  Lower Extremity Angiography Poss PTA     CANCELED: Arterial-  Lower Extremity Angiography Poss Thrombo     CANCELED: Arterial-  Lower Extremity Angiography Poss Thrombo          Information sent by a UR Department Case Management Assistant

## 2023-11-15 NOTE — Discharge Summary (Addendum)
 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   #Hyperlipidemia  #Insomnia  #Acute on chronic renal failure  #Diabetes  #Neuropathy  #Chronic HFpEF and grade 1 DD -compensated currently. Monitor closely with IVF given for AKI post procedure   # Hypertension - controlled  # CAD  # A-fib on eliquis     Hospital Course:      Scott Monroe. is a 78 y.o. male with diabetes, hypertension, hyperlipidemia, chronic HFpEF (grade 1) A-fib (on Eliquis ) with recent ED visit 11/02/2023 for right hallux osteomyelitis with overlying abscess who presented to the emergency room and was noted to have gram-negative bacteremia and UTI with + Ucx(now growing Klebsiella and E. coli).  Recent wound culture growing Pseudomonas and staph ludgenesis.     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 S/p angiogram 10/25.  Additional angiogram 10/27, bypass performed on 10/28. Will need PT again and resumption of AC and aspirin  before [place].      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  Bowel regimen while on opiates   - PT recommended patient is stable to go home    #Gram-negative bacteremia  - Followed by ID most likely source CAUTI  versus osteomyelitis  - F/u BCx 10/18. 10/20 BCx NGTD  - Continue with Zosyn monotherapy per ID while admitted.  On discharge transitioned to 2 weeks Levaquin 500 mg p.o. 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   -s/p angiogram 10/25: Dr. Priscille Op note reviewed   -S/p SFA to PT bypass on 10/28  - Was monitored after resumption of Eliquis , Hb stable   -PT/OT recommending home with supervision     #Hyperlipidemia  - Continue with atorvastatin      #Insomnia  - Trazodone      #Acute on chronic renal failure  - Has not been following with specialists.  Transitional care referral has been placed  - Hold valsartan  in the setting of contrast studies, blood pressure stable  -Gentle IVF post angiogram procedure 10/25 and 10/27  - Avoid nephrotoxins      #Diabetes  -Most recent A1C 7.3% (11/04/2023)  -BG trend acceptable in hospital      #Neuropathy  - Gabapentin  dose adjusted in the setting of renal dysfunction per pharmacy  - Holding metformin  during admission  -  Sliding scale insulin      #Chronic HFpEF and grade 1 DD -compensated currently. Monitor closely with IVF given for AKI post procedure   # Hypertension - controlled  # CAD  # A-fib  - Continue with atorvastatin   - Metoprolol  25 twice daily, amlodipine  - Holding valsartan  in the setting of renal failure  - Continue with aspirin   - Resumed eliquis        Discharge Day Exam:      General: awake, alert, oriented x 3; no acute distress  HEENT: anicteric sclerae, PERRL, EOMI; MMM  Cardiovascular: regular rate and rhythm; no murmurs, rubs, or gallops  Lungs: clear to auscultation bilaterally without wheezing, rhonchi, or  rales  Abdomen: soft, non-distended; non-tender to palpation, no rebound or guarding  Extremities: warm; no LE edema; no clubbing or cyanosis, bandaged right lower extremity, multiple staples noted  Neuro: symmetric facial movements, clear speech, moving all extremities        Pertinent Labs:   CBC:   Recent Labs   Lab 11/14/23  0846 11/13/23  0346 11/12/23  0800   WBC 7.36 7.52 7.40   Hemoglobin 8.1* 8.1* 7.8*   Hematocrit 25.3* 24.9* 24.2*   Platelet Count 198 193 217   MCV 87.2 85.0 84.9     BMP:   Recent Labs   Lab 11/14/23  0421 11/13/23  0346 11/12/23  0328   Sodium 138 136 137   Potassium 4.6 4.7 4.3   Chloride 110 108 107   CO2 22 19 21    BUN 24 29* 29*   Creatinine 1.7* 1.7* 1.9*   Calcium  8.5 8.1 8.3   Glucose 147* 431* 287*           Radiology and Procedures:   Radiology: all results from this admission  US  Venous Duplex Doppler Leg Bilateral Limited  Result Date: 11/12/2023  1. Widely patent bilateral great and small saphenous veins, no evidence of superficial thrombosis. 2. Vein measurements as above. Dionicio DOROTHA Capri, DO 11/12/2023 10:38 AM    XR Foot Right AP Lateral And Oblique  Result Date: 11/06/2023  Partial amputation of the right hallux at the proximal phalangeal neck. [name], MD 11/06/2023 1:29 PM    US  Renal Kidney  Result Date: 11/06/2023   1.No hydronephrosis. 2.Mild asymmetric atrophy of the right kidney when compared to the left. No cortical thinning or increased echogenicity to suggest further evidence of chronic renal disease. 3.Prostatomegaly. [name], MD 11/06/2023 9:52 AM    XR Chest AP Portable  Result Date: 11/06/2023  No acute cardiopulmonary process.    [name], MD 11/06/2023 9:43 AM    US  Arterial/ Graft Duplex Dopp Low Extrem Bil Comp  Result Date: 11/02/2023  1.Bilateral ABI and TBI consistent with severe peripheral arterial disease. Severe multifocal atherosclerotic disease throughout the bilateral lower extremity arteries. 2.Right lower extremity:  Focal occlusion in the mid/distal popliteal artery with distal reconstitution. Three-vessel runoff with weakly biphasic waveforms. 3.Left lower extremity: Pulses parvus and tardus waveforms in the proximal common femoral artery in setting of known common iliac artery stenosis. Occlusion of the superficial femoral artery with reconstitution of the popliteal artery. Three-vessel runoff with weakly monophasic waveforms. Findings above discussed with ED PA Reche Dadds via telephone at 6:43 PM on 11/02/2023 with verbal confirmation. [name], MD 11/02/2023 6:43 PM    US  Noninvasive Low Extrem Art Dopp/Press/Wavefrms (Abi-Ppg) Ltd 1-2 Lvls  Result Date: 11/02/2023  1.Bilateral ABI and TBI consistent with severe  peripheral arterial disease. Severe multifocal atherosclerotic disease throughout the bilateral lower extremity arteries. 2.Right lower extremity: Focal occlusion in the mid/distal popliteal artery with distal reconstitution. Three-vessel runoff with weakly biphasic waveforms. 3.Left lower extremity: Pulses parvus and tardus waveforms in the proximal common femoral artery in setting of known common iliac artery stenosis. Occlusion of the superficial femoral artery with reconstitution of the popliteal artery. Three-vessel runoff with weakly monophasic waveforms. Findings above discussed with ED PA Reche Dadds via telephone at 6:43 PM on 11/02/2023 with verbal confirmation. [name], MD 11/02/2023 6:43 PM    Toes Right 2+ Vws  Result Date: 11/02/2023  1.Prominent erosive/destructive change with fragmentation in the distal tuft and shaft of the distal phalanx, right great toe, concerning for osteomyelitis and pathologic fracture. 2.Nonhealing wound in the dorsal aspect of the right great toe overlying the distal phalanx and at the region of the nailbed. [name], MD 11/02/2023 12:14 PM     Discharge Medications and Documented Allergies:        Discharge Medication List        Taking      amLODIPine  10 MG tablet  Dose: 10 mg  Commonly known as: NORVASC  Take 1 tablet (10 mg) by mouth once daily     apixaban  2.5 MG tablet  Dose: 2.5 mg  What changed:   medication strength  how much to take  when to take this  Commonly known as: ELIQUIS   Take 1 tablet (2.5 mg) by mouth every 12 (twelve) hours     aspirin  EC 81 MG EC tablet  Dose: 81 mg  For: per pt  Take 1 tablet (81 mg) by mouth once daily     atorvastatin  80 MG tablet  Dose: 80 mg  What changed: Another medication with the same name was removed. Continue taking this medication, and follow the directions you see here.  Commonly known as: LIPITOR  Take 1 tablet (80 mg) by mouth nightly     BD Pen Needle Micro U/F 32G X 6 MM Misc  Generic drug: Insulin  Pen Needle  Use to inject insulin  into the skin 2 times per day. Ok to substitute with needle length and/or gauge of choice.     cephALEXin 500 MG capsule  Dose: 500 mg  What changed: when to take this  Commonly known as: KEFLEX  Take 1 capsule (500 mg) by mouth 2 (two) times daily for 6 days     furosemide  40 MG tablet  Dose: 20 mg  Commonly known as: LASIX   For: Heart Failure  Take 0.5 tablets (20 mg) by mouth as needed (if you gain 2 lbs in 24h or 5 lbs in 5 days)     gabapentin  300 MG capsule  Dose: 600 mg  What changed: Another medication with the same name was removed. Continue taking this medication, and follow the directions you see here.  Commonly known as: NEURONTIN   Take 2 capsules (600 mg) by mouth 3 (three) times daily     insulin  NPH-regular 70-30 MIXTURE (70-30) 100 UNIT/ML pen-injector  Commonly known as: HumuLIN  70/30  For: Type 2 Diabetes  Inject 30 units with breakfast and 20 units with dinner     levoFLOXacin 500 MG tablet  Dose: 500 mg  Commonly known as: LEVAQUIN  Take 1 tablet (500 mg) by mouth every other day for 6 days     metFORMIN  1000 MG tablet  Dose: 1,000 mg  Commonly known as: GLUCOPHAGE   For:  per pt  Take 1 tablet (1,000 mg) by mouth 2 (two) times daily with meals     metoprolol   tartrate 25 MG tablet  Dose: 25 mg  Commonly known as: LOPRESSOR   Take 1 tablet (25 mg) by mouth 2 (two) times daily Hold for blood pressure lower than 100/60     oxyCODONE  10 MG immediate release tablet  Dose: 10 mg  Commonly known as: ROXICODONE   Take 1 tablet (10 mg) by mouth every 6 (six) hours as needed for Pain     pantoprazole  40 MG tablet  Dose: 40 mg  Commonly known as: PROTONIX   Take 1 tablet (40 mg) by mouth every morning before breakfast     tamsulosin  0.4 MG Caps  Dose: 0.4 mg  Commonly known as: FLOMAX   Take 1 capsule (0.4 mg) by mouth Daily after dinner     traZODone  50 MG tablet  Dose: 50 mg  Commonly known as: DESYREL   Take 1 tablet (50 mg) by mouth nightly            STOP taking these medications      metoprolol  succinate XL 25 MG 24 hr tablet  Commonly known as: TOPROL -XL     sulfamethoxazole-trimethoprim 800-160 MG per tablet  Commonly known as: BACTRIM DS     valsartan  40 MG tablet  Commonly known as: DIOVAN               Allergies[1]         Disposition:     Discharge Disposition: Home with family    Discharge Code Status: Full Code    Patient Emergency Contact:  Extended Emergency Contact Information  Primary Emergency Contact: [facility]  Mobile Phone: [phone]  Relation: Daughter  Preferred language: English    Discharge Instructions:     Patient Instructions: (See AVS for full details)  Follow-up with PCP, urology, vascular, nephrology, ID as an outpatient    Most Recent Diet Order:  No orders of the defined types were placed in this encounter.      Activity/Weight Bearing Status: Activity as tolerated      Outpatient Follow-Up Plan:     Instructions for PCP:  Follow-up in 1 week    Appointments:   Follow-up Information       Twana Bartley SAILOR, MD Follow up in 2 week(s).    Specialties: Nephrology, Internal Medicine  Contact information:  [address]  [address]  [place] [address]  [phone]               Rea Agent, DPM. Schedule an appointment as soon as possible for a visit in 1  week(s).    Specialties: Foot Surgery, Reconstructive Rearfoot and Ankle Surgery  Contact information:  [address] [place] [address]  [phone]               Mukherjee, Dipankar, MD Follow up in 2 week(s).    Specialties: Vascular Surgery, Surgery  Why: Follow up for wound check  Contact information:  [address]  [address] [address]  [phone]               Mac Macintosh, MD. Schedule an appointment as soon as possible for a visit.    Specialty: Infectious Disease  Contact information:  [address] [place]  [address]  [place] [address]  [phone]               [facility] Follow up in 2 day(s).  Specialty: Home Health Services  Why: Skilled nursing, physical and occupational therapy home visits  Contact information:  [address]  [place]  [address]  [phone]             Pcp, None, MD .                             Pending Labs, Microbiology, and Pathology:  Unresulted Labs       Procedure . . . Date/Time    Arterial-  Lower Extremity Angiography Poss PTA - In process [[id number]] Resulted: 11/11/23 1642     Updated: 11/11/23 1819    This result has not been signed. Information might be incomplete.      Culture and Smear, Acid Fast Bacilli (AFB/Mycobacteria)(Order) [[id number]] Collected: 11/06/23 1205    Specimen: Swab from Foot, Right Updated: 11/07/23 1120    Narrative:      The following orders were created for panel order Culture and Smear, Acid Fast Bacilli (AFB/Mycobacteria)(Order).  Procedure                               Abnormality         Status                   ---------                               -----------         ------                   Acid Fast Bacilli Stain.SABRASABRA[[id number]]  Normal              Final result             Culture, Acid Fast Baci.SABRASABRA[[id number]]                      Preliminary result           Please view results for these tests on the individual orders.    Culture and Smear, Fungal (Order) [[id number]] Collected:  11/06/23 1205    Specimen: Swab from Foot, Right Updated: 11/07/23 9193    Narrative:      The following orders were created for panel order Culture and Smear, Fungal (Order).  Procedure                               Abnormality         Status                   ---------                               -----------         ------                   Fungal Stain ([id number]]                        Final result             Culture, Fungal (Compon.SABRASABRA[[id number]]  Preliminary result           Please view results for these tests on the individual orders.            Attestations and Signatures:     Minutes spent coordinating discharge and reviewing discharge plan: 35 minutes                   [1]   Allergies  Allergen Reactions    Strawberry C [Ascorbate] Hives    Strawberry Extract Hives

## 2023-11-16 LAB — RED BLOOD CELLS OR HOLD
Expiration Date: 202511232359
Expiration Date: 202511242359
ISBT CODE: 6200
ISBT CODE: 6200
Product Status: TRANSFUSED
Product Status: TRANSFUSED

## 2023-11-18 ENCOUNTER — Encounter: Payer: Self-pay | Admitting: Specialist

## 2023-11-18 ENCOUNTER — Ambulatory Visit (INDEPENDENT_AMBULATORY_CARE_PROVIDER_SITE_OTHER): Admitting: Family

## 2023-11-18 LAB — LAB USE ONLY - ORGANISM REFERRED FOR IDENTIFICATION, AEROBIC BACTERIA

## 2023-11-18 LAB — LAB USE ONLY - ANTIMICROBIAL SUSCEPTIBILITY, AEROBIC BACTERIA

## 2023-11-19 LAB — CULTURE BLOOD AEROBIC AND ANAEROBIC

## 2023-11-21 NOTE — Progress Notes (Signed)
 Name: Burnette Sautter      ### Patient Details  Date of Birth: 08-Oct-1945  MRN: [id number]      ### Encounter Details  Arrival Date: 11/04/2023 10:40 AM EDT  Discharge Date: 11/14/2023 04:23 PM EDT  Encounter ID: [id number] 16:23:00    ### Related interaction   - TCM V2 Post Discharge Outreach (Post Discharge TCM V2 Outreach 1) ([email or web address])        ### Required Interventions and Feedback     Call Status         Call Status:     No Attempt (edited by TF on 11/21/2023 03:00 PM EST)    Comments::     Patient received an automated post-discharge outreach call. Hangup (1st attempt), Incomplete (2nd attempt), Hangup (3rd attempt). Unable to reach the patient, no further action/outreach needed at this time. (edited by TF on 11/21/2023 03:01 PM EST)    Verneita Leverne) F.  Care Coordinator, Ambulatory Care Management  [facility]   [address], [place], [place] [address]

## 2023-12-02 LAB — LAB USE ONLY - CULTURE, FUNGAL: Culture Fungal: NO GROWTH

## 2023-12-04 ENCOUNTER — Ambulatory Visit (INDEPENDENT_AMBULATORY_CARE_PROVIDER_SITE_OTHER): Admitting: Internal Medicine

## 2023-12-04 ENCOUNTER — Encounter (INDEPENDENT_AMBULATORY_CARE_PROVIDER_SITE_OTHER): Payer: Self-pay

## 2023-12-04 ENCOUNTER — Encounter (INDEPENDENT_AMBULATORY_CARE_PROVIDER_SITE_OTHER): Payer: Self-pay | Admitting: Internal Medicine

## 2023-12-04 VITALS — BP 97/63 | HR 126 | Temp 97.4°F | Resp 18 | Ht 67.0 in | Wt 128.6 lb

## 2023-12-04 DIAGNOSIS — R3914 Feeling of incomplete bladder emptying: Secondary | ICD-10-CM

## 2023-12-04 DIAGNOSIS — I4891 Unspecified atrial fibrillation: Secondary | ICD-10-CM

## 2023-12-04 DIAGNOSIS — I739 Peripheral vascular disease, unspecified: Secondary | ICD-10-CM

## 2023-12-04 DIAGNOSIS — N401 Enlarged prostate with lower urinary tract symptoms: Secondary | ICD-10-CM

## 2023-12-04 DIAGNOSIS — E1165 Type 2 diabetes mellitus with hyperglycemia: Secondary | ICD-10-CM

## 2023-12-04 MED ORDER — FREESTYLE LIBRE 3 SENSOR MISC: 1.0000 | 2 refills | Status: AC

## 2023-12-04 MED ORDER — FREESTYLE LIBRE 3 READER DEVI: Freq: Four times a day (QID) | 1 refills | Status: AC

## 2023-12-04 NOTE — Addendum Note (Signed)
 Addended byBETHA SCARCE, Christee Mervine on: 12/04/2023 10:11 AM     Modules accepted: Orders

## 2023-12-04 NOTE — Progress Notes (Signed)
 Subjective:      Date: 12/04/2023 9:59 AM   Patient ID: Scott Evitt. is a 78 y.o. male.    Chief Complaint:  Chief Complaint   Patient presents with    Establish Care     Pt presents today to establish PC; follow up with surgery 11/04/23. Pt states another option for diabetic monitoring.        HPI:  78 year old male with history of CHF, A-fib, peripheral vascular disease, diabetes here to establish care.  He was recently hospitalized due to osteomyelitis of the right foot requiring potation of the toe.  He was also found to have extensive peripheral vascular disease requiring vascular intervention.  He has completed oral antibiotics since discharge.   Does report that he has been feeling slightly weak and dizzy at times and has noted that his blood pressure seems to be running low with systolics in the 90s.  His blood pressure medications have been modified since discharge from the hospital currently only on amlodipine  10 mg.      Current Medications:  Medications Taking[1]    Allergies:  Allergies[2]    Past Medical History:  Medical History[3]    Past Surgical History:  Past Surgical History[4]    Family History:  Family History[5]    Social History:  Social History[6]       The following sections were reviewed this encounter by the provider:          ROS:  Review of Systems     Objective:   Vitals:  BP 97/63 (BP Site: Right arm, Patient Position: Sitting, Cuff Size: Medium) Comment: 2nd reading 90/61  Pulse (!) 126   Temp 97.4 F (36.3 C) (Oral)   Resp 18   Ht 1.702 m (5' 7)   Wt 58.3 kg (128 lb 9.6 oz)   SpO2 99%   BMI 20.14 kg/m       Physical Exam:  General Examination:   GENERAL APPEARANCE: alert, in no acute distress, well developed, well nourished, oriented to time, place, and person.   HEAD: normal appearance, atraumatic.   EYES: extraocular movement intact (EOMI), pupils equal, round, reactive to light and accommodation, sclera anicteric, conjunctiva clear.   EARS: tympanic membranes  normal bilaterally, external canals normal .   NOSE: normal nasal mucosa, no lesions.   ORAL CAVITY: normal oropharynx, normal lips, mucosa moist, no lesions.    NECK: neck supple,no cervical lymphadenopathy, no neck mass palpated, no jugular venous distention, no thyromegaly.    HEART: S1, S2 normal, no murmurs, rubs, gallops, regular rate and rhythm.   LUNGS: normal effort / no distress, normal breath sounds, clear to auscultation bilaterally, no wheezes, rales, rhonchi.   ABDOMEN: bowel sounds present, no hepatosplenomegaly, soft, nontender, nondistended.   EXTREMITIES: no edema, no clubbing, cyanosis, or edema. Surgical scar  along the R  leg  MUSCULOSKELETAL: full range of motion, no swelling or deformity.    NEUROLOGIC: nonfocal, cranial nerves 2-12 grossly intact, deep tendon reflexes 2+ symmetrical, normal strength, tone and reflexes, sensory exam intact.   PSYCH: cognitive function intact, mood/affect full range, speech clear.     Assessment/plan:       1. Atrial fibrillation, unspecified type (CMS/HCC)  I recommend that he restarts the metoprolol  to help both stabilize his heart rate as well as managing his blood pressure.  Since his blood pressure has been running low on the amlodipine  I recommend stopping the amlodipine  and monitor his blood pressure his blood pressure  rises over 100 he is to restart the metoprolol  at 12.5 mg once a day.  Also recommend that he follows up with his cardiologist to reassess the status of his A-fib as well as his CHF.  2. Uncontrolled type 2 diabetes mellitus with hyperglycemia (CMS/HCC)  His recent A1c is 7.3 shows significant improvement but still continue with metformin  at thousand twice daily will reassess in 2 months.  3. PAD (peripheral artery disease)  Status post surgical intervention and he is to follow-up with the vascular surgeons  4. Benign prostatic hyperplasia with incomplete bladder emptying  Recent evaluation in hospital and showed possible thickening of the  bladder wall he is to follow-up with urology.        Follow-up:   No follow-ups on file.       Hildegard Scarce, MD,  FACP          [1]   Outpatient Medications Marked as Taking for the 12/04/23 encounter (Office Visit) with Scarce Hildegard, MD   Medication Sig Dispense Refill    apixaban  (ELIQUIS ) 2.5 MG tablet Take 1 tablet (2.5 mg) by mouth every 12 (twelve) hours 60 tablet 0    aspirin  EC 81 MG EC tablet Take 1 tablet (81 mg) by mouth once daily      atorvastatin  (LIPITOR) 80 MG tablet Take 1 tablet (80 mg) by mouth nightly 30 tablet 0    gabapentin  (NEURONTIN ) 300 MG capsule Take 2 capsules (600 mg) by mouth 3 (three) times daily 72 capsule 0    metFORMIN  (GLUCOPHAGE ) 1000 MG tablet Take 1 tablet (1,000 mg) by mouth 2 (two) times daily with meals      metoprolol  tartrate (LOPRESSOR ) 25 MG tablet Take 1 tablet (25 mg) by mouth 2 (two) times daily Hold for blood pressure lower than 100/60 60 tablet 0    [DISCONTINUED] amLODIPine  (NORVASC ) 10 MG tablet Take 1 tablet (10 mg) by mouth once daily 30 tablet 0    [DISCONTINUED] furosemide  (LASIX ) 40 MG tablet Take 0.5 tablets (20 mg) by mouth as needed (if you gain 2 lbs in 24h or 5 lbs in 5 days) 30 tablet 0    [DISCONTINUED] insulin  NPH-regular 70-30 MIXTURE (HumuLIN  70/30) (70-30) 100 UNIT/ML injection Inject 30 units with breakfast and 20 units with dinner 10 mL 0    [DISCONTINUED] Insulin  Pen Needle (BD Pen Needle Micro U/F) 32G X 6 MM Misc Use to inject insulin  into the skin 2 times per day. Ok to substitute with needle length and/or gauge of choice. 100 each 0    [DISCONTINUED] pantoprazole  (PROTONIX ) 40 MG tablet Take 1 tablet (40 mg) by mouth every morning before breakfast 30 tablet 0    [DISCONTINUED] tamsulosin  (FLOMAX ) 0.4 MG Cap Take 1 capsule (0.4 mg) by mouth Daily after dinner 30 capsule 0    [DISCONTINUED] traZODone  (DESYREL ) 50 MG tablet Take 1 tablet (50 mg) by mouth nightly 30 tablet 0   [2]   Allergies  Allergen Reactions    Strawberry C  [Ascorbate] Hives    Strawberry Extract Hives   [3]   Past Medical History:  Diagnosis Date    Hyperlipidemia     Hypertension     Insomnia     Irregular heart beat     Restless leg     Sleep apnea    [4]   Past Surgical History:  Procedure Laterality Date    ARTERIAL-  LOWER EXTREMITY ANGIOGRAPHY POSS PTA Right 11/09/2023    Procedure: Arterial-  Lower  Extremity Angiography Poss PTA;  Surgeon: Marinus Longest, MD;  Location: [facility];  Service: Cardiovascular;  Laterality: Right;    ARTERIAL-  LOWER EXTREMITY ANGIOGRAPHY POSS PTA N/A 11/11/2023    Procedure: Arterial-  Lower Extremity Angiography Poss PTA;  Surgeon: Marinus Longest, MD;  Location: [facility];  Service: Cardiovascular;  Laterality: N/A;    BACK SURGERY      FEMORAL-POPLITEAL BYPASS Right 11/12/2023    Procedure: CREATION, BYPASS, ARTERIAL, FEMORAL TO POSTERIOR POPLITEAL; REVERSE SAPHENOUS VEIN BYPASS;  Surgeon: Marinus Longest, MD;  Location: [facility];  Service: Vascular;  Laterality: Right;  10/28-CARD SENT    SPINAL CORD STIMULATOR IMPLANT     [5] No family history on file.  [6]   Social History  Tobacco Use    Smoking status: Every Day     Current packs/day: 1.00     Types: Cigarettes    Smokeless tobacco: Never   Vaping Use    Vaping status: Never Used   Substance Use Topics    Alcohol use: Never    Drug use: Never

## 2023-12-04 NOTE — Progress Notes (Signed)
????????????????????????????????????????????????????????????????    Patient: Scott Monroe, Scott Monroe (MRN: [id number])  ????????????????????????????????????????????????????????????????  THIS FORM IS FOR INTERNAL USE FOR VASCULAR SURGERY AND IS NOT A PROGRESS NOTE    Appt Date/Time: 12/09/2023 @ 1:30pm                            Room:    78 y.o. male    PCP: Edmon Lash, MD                                               Referring Physician:   Preferred Language: English    Appointment Type: Post Op  Imaging: Epic Imaging  ?  LE venous dplx on 11/12/23     Chief Complaint/HPI: 1st post-op f/u: S/p R fem to post tibial artery bypass done om 11/12/23    Date of Last Visit: 11/05/2023 ED consult w/ Dr. Marinus     Most Recent Vascular Surgery History:  11/12/2023: R Fem to post tibial artery bypass using reversed GSV performed by Dr. Marinus    Medications:  Aspirin , Eliquis /Apixaban , Metformin , and Atorvastatin     GLP1: None    Focused Prior Medical History: HLD and HTN    Social Determinants of Health:  ?? What Matters Most:  ?? ETOH: Complete  ?? Depression: Complete  ?? Tobacco Use: Current Smoker    Vital Signs this visit                                    Pulses this visit  HT  BP R BP L Temp   Rad Ulnar Brach Fem Pop DP PT        R          WT  Pulse R Pulse L SpO2       L            Allergies: Allergies[1]       [1]   Allergies  Allergen Reactions    Strawberry C [Ascorbate] Hives    Strawberry Extract Hives

## 2023-12-05 ENCOUNTER — Telehealth (INDEPENDENT_AMBULATORY_CARE_PROVIDER_SITE_OTHER): Payer: Self-pay | Admitting: Internal Medicine

## 2023-12-05 LAB — LAB USE ONLY - CULTURE, FUNGAL: Culture Fungal: NO GROWTH

## 2023-12-05 NOTE — Telephone Encounter (Signed)
 PA Started. Sent to plan.

## 2023-12-05 NOTE — Telephone Encounter (Signed)
 Patient could not be verified. Will call CMM.

## 2023-12-09 ENCOUNTER — Ambulatory Visit (INDEPENDENT_AMBULATORY_CARE_PROVIDER_SITE_OTHER): Admitting: Physician Assistant

## 2023-12-09 ENCOUNTER — Encounter (INDEPENDENT_AMBULATORY_CARE_PROVIDER_SITE_OTHER): Payer: Self-pay | Admitting: Physician Assistant

## 2023-12-09 VITALS — BP 135/81 | HR 72 | Ht 68.0 in | Wt 138.0 lb

## 2023-12-09 DIAGNOSIS — Z95828 Presence of other vascular implants and grafts: Secondary | ICD-10-CM

## 2023-12-09 MED ORDER — APIXABAN 2.5 MG PO TABS: 2.5000 mg | ORAL_TABLET | Freq: Two times a day (BID) | ORAL | 0 refills | Status: AC

## 2023-12-09 MED ORDER — OXYCODONE HCL 10 MG PO TABS
10.0000 mg | ORAL_TABLET | Freq: Two times a day (BID) | ORAL | 0 refills | Status: AC | PRN
Start: 2023-12-09 — End: 2023-12-16

## 2023-12-09 MED ORDER — GABAPENTIN 300 MG PO CAPS: 600.0000 mg | ORAL_CAPSULE | Freq: Three times a day (TID) | ORAL | 0 refills | Status: DC

## 2023-12-09 NOTE — Progress Notes (Unsigned)
 OFFICE VISIT  Vascular and Endovascular Surgery      Date: December 09, 2023  Vascular Surgeon: Janyce Ovens, MD  Chief Complaint:    Chief Complaint   Patient presents with    Post-op     1st post-op f/u: S/p R fem to post tibial artery bypass done om 11/12/23       History of Present Illness     Scott Monroe. is a 78 y.o. male who is status post right EIA stent placement on 10/25, followed by right femoral-popliteal bypass with rGSV done on 11/12/23 by Dr. 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 Patient was discharged on ASA and Eliquis .     Since his discharge he reports he reports ambulating with some difficulty but overall well. Incision sites are intact with staples in place. Patient reports pain at night that interferes with his sleep. He is accompanied today by his daughter.     PMH includes HLD, HTN, DM II, atrial fibrillation, tobacco abuse, neuropathy, bladder mass, urinary retention with chronic foley catheter.     Past Medical History     Medical History[1]    Past Surgical History     Past Surgical History[2]     Medications     Medications Ordered Prior to Encounter[3]    Allergies     Allergies[4]     Family History     Family History[5]    Social History      Social History[6]    Review of Systems   A comprehensive twelve point review of systems was: Negative in the neurologic, respiratory, cardiac, vascular, gastrointestinal, genitourinary, musculoskeletal, immunologic, psychiatric or endocrinologic systems except as mentioned above in HPI.       Physical Exam     Vitals:    12/09/23 1354   BP: 135/81   BP Site: Left arm   Patient Position: Sitting   Cuff Size: Medium   Pulse: 72   SpO2: 98%   Weight: 62.6 kg (138 lb)   Height: 1.727 m (5' 8)      Body mass index is 20.98 kg/m.    Physical Exam  Vitals reviewed.   Constitutional:       Appearance: Normal appearance.   HENT:      Head: Normocephalic and atraumatic.    Cardiovascular:      Pulses:           Dorsalis pedis pulses are 2+ on the right side.        Posterior tibial pulses are 2+ on the right side.   Pulmonary:      Effort: Pulmonary effort is normal.   Musculoskeletal:      Right lower leg: Edema (mild) present.      Left lower leg: No edema.   Skin:     General: Skin is warm and dry.      Capillary Refill: Capillary refill takes less than 2 seconds.      Findings: Wound (thigh and calf incisions C/D/I with staples in place) present. No erythema or rash.   Neurological:      General: No focal deficit present.      Mental Status: He is alert and oriented to person, place, and time.   Psychiatric:         Mood and Affect: Mood normal.         Behavior: Behavior normal.       Labs  CBC:   WBC   Date/Time Value Ref Range Status   11/14/2023 08:46 AM 7.36 3.10 - 9.50 x10 3/uL Final   04/21/2021 12:18 PM 7.70 3.10 - 9.50 x10 3/uL Final     RBC   Date/Time Value Ref Range Status   11/14/2023 08:46 AM 2.90 (L) 4.20 - 5.90 x10 6/uL Final   04/21/2021 12:18 PM 5.72 4.20 - 5.90 x10 6/uL Final     Hemoglobin   Date/Time Value Ref Range Status   11/14/2023 08:46 AM 8.1 (L) 12.5 - 17.1 g/dL Final     Hgb   Date/Time Value Ref Range Status   04/21/2021 12:18 PM 15.0 12.5 - 17.1 g/dL Final     Hematocrit   Date/Time Value Ref Range Status   11/14/2023 08:46 AM 25.3 (L) 37.6 - 49.6 % Final   04/21/2021 12:18 PM 46.4 37.6 - 49.6 % Final     MCV   Date/Time Value Ref Range Status   11/14/2023 08:46 AM 87.2 78.0 - 96.0 fL Final   04/21/2021 12:18 PM 81.1 78.0 - 96.0 fL Final     MCHC   Date/Time Value Ref Range Status   11/14/2023 08:46 AM 32.0 31.5 - 35.8 g/dL Final   95/92/7976 87:81 PM 32.3 31.5 - 35.8 g/dL Final     RDW   Date/Time Value Ref Range Status   11/14/2023 08:46 AM 16 (H) 11 - 15 % Final   04/21/2021 12:18 PM 15 11 - 15 % Final     Platelet Count   Date/Time Value Ref Range Status   11/14/2023 08:46 AM 198 142 - 346 x10 3/uL Final     Platelets   Date/Time Value Ref  Range Status   04/21/2021 12:18 PM 191 142 - 346 x10 3/uL Final       CMP:   Sodium   Date/Time Value Ref Range Status   11/14/2023 04:21 AM 138 135 - 145 mEq/L Final   04/21/2021 12:18 PM 135 135 - 145 mEq/L Final     Potassium   Date/Time Value Ref Range Status   11/14/2023 04:21 AM 4.6 3.5 - 5.3 mEq/L Final   04/21/2021 12:18 PM 4.3 3.5 - 5.3 mEq/L Final     Chloride   Date/Time Value Ref Range Status   11/14/2023 04:21 AM 110 99 - 111 mEq/L Final   04/21/2021 12:18 PM 100 99 - 111 mEq/L Final     CO2   Date/Time Value Ref Range Status   11/14/2023 04:21 AM 22 17 - 29 mEq/L Final   04/21/2021 12:18 PM 25 17 - 29 mEq/L Final     Glucose   Date/Time Value Ref Range Status   11/14/2023 04:21 AM 147 (H) 70 - 100 mg/dL Final     Comment:     ADA Guidelines for diabetes mellitus:  Fasting: Equal to or greater than 126 mg/dL  Random: Equal to or greater than 200 mg/dL   95/92/7976 87:81 PM 712 (H) 70 - 100 mg/dL Final     Comment:     ADA guidelines for diabetes mellitus:  Fasting:  Equal to or greater than 126 mg/dL  Random:   Equal to or greater than 200 mg/dL       BUN   Date/Time Value Ref Range Status   11/14/2023 04:21 AM 24 9 - 28 mg/dL Final   95/92/7976 87:81 PM 16.0 9.0 - 28.0 mg/dL Final     Protein, Total   Date/Time Value Ref Range Status  11/04/2023 11:01 AM 7.2 6.0 - 8.3 g/dL Final   95/92/7976 87:81 PM 6.5 6.0 - 8.3 g/dL Final     Alkaline Phosphatase   Date/Time Value Ref Range Status   11/04/2023 11:01 AM 110 37 - 117 U/L Final   04/21/2021 12:18 PM 146 (H) 37 - 117 U/L Final     AST (SGOT)   Date/Time Value Ref Range Status   11/04/2023 11:01 AM 25 <=41 U/L Final   04/21/2021 12:18 PM 23 5 - 41 U/L Final     ALT   Date/Time Value Ref Range Status   11/04/2023 11:01 AM 17 <=55 U/L Final   04/21/2021 12:18 PM 21 0 - 55 U/L Final     Anion Gap   Date/Time Value Ref Range Status   11/14/2023 04:21 AM 6.0 5.0 - 15.0 Final     Comment:     Calculated Anion Gap = Na - (Cl + CO2)  Interpret with caution;  calculated Anion Gap may not reflect patient's true clinical status.     This is a calculated value and platform-dependent. A value >12.0 has been recommended for the management of Hyperglycemic Crises: Diabetic Ketoacidosis and Hyperglycemic Hyperosmolar State.Med Clin [place]. 2017;101(3):587-606.doi,10.1016/j.mcna.2016.12.011   04/21/2021 12:18 PM 10.0 5.0 - 15.0 Final     Comment:     Calculated AGAP = Na - (CL + CO2)  Interpret with caution; calculated AGAP may not  reflect patient's true clinical status.  This is a calculated value and platform-dependent.  A value >12.0 has been recommended for the management of  Hyperglycemic Crises: Diabetic Ketoacidosis and Hyperglycemic  Hyperosmolar State. Med Clin [place] 2017;101(3):587-606.  doi:10.1016/j.mcna.2016.12.011         Lipid Panel No results found for: CHOL, TRIG, HDL    Coags:   PT   Date/Time Value Ref Range Status   04/16/2021 04:23 PM 11.6 10.1 - 12.9 sec Final     PT INR   Date/Time Value Ref Range Status   04/16/2021 04:23 PM 1.0 0.9 - 1.1 Final     Comment:     Recommended Ranges for Protime INR:  2.0-3.0 for most medical and surgical thromboembolic states  7.4-6.4 for artificial heart valves INR result may not represent  exact Warfarin dosing level during the transition period from  Heparin  to Warfarin therapy. Results should be interpreted based  on current anticoagulant therapy and patient's clinical presentation.       PTT   Date/Time Value Ref Range Status   11/09/2023 03:31 PM 29 27 - 39 sec Final     Comment:     Anti-Xa is the primary test for monitoring heparin  therapy. APTT is the screening test for bleeding disorders/coagulopathies.  APTT may be used as a secondary monitoring of heparin  therapy if Anti-Xa cannot be measured.   04/16/2021 09:17 PM 27 27 - 39 sec Final     Comment:     Anti-Xa is the primary test for monitoring heparin  therapy. APTT  is the screening test for bleeding disorders/coagulopathies.  APTT may be used as a  secondary monitoring of heparin  therapy  if Anti-Xa cannot be measured.         Results were reviewed and discussed with patient.     Diagnostic Imaging   No updated vascular imaging performed in office today.     Assessment and Plan     1. Status post femoral-popliteal bypass surgery        Jaikob Borgwardt. is  a 77 y.o. male who is status post right IA stent placement, followed by right femoral-popliteal bypass with rGSV done for non-healing right hallux amputation in the setting of critical limb ischemia. Post operatively he reports to be doing overall well, however pain is not well controlled. I have refilled his Gabapentin  and Oxycodone . Incision sites along groin, thigh and calf are intact and healing well. All staples were placed today and incisions reinforced with steri strips. On exam right leg appears well perfused with palpable distal pulses. His amputation site is healing well. I recommend he continue ASA and Eliquis  for graft patency. He will return in 2 weeks for LE arterial studies and follow up visit.     Return in about 2 weeks (around 12/23/2023) for LE arterial duplex, ABI, follow up with APP.    It was our pleasure to see your patient in the Vascular Surgery Office today. Please do not hesitate to contact us  if you have any questions or concerns. Thank you for the opportunity to care for your patient.     Sincerely,     Khalib Fendley A. Rupert RIGGERS  Physician Assistant  [facility] Vascular Surgery  [address]  [place], [place] [address]  T  [phone]  F [phone]    Orders Placed This Encounter   Procedures    US  Arterial/ Graft Duplex Dopp Low Extrem Right    US  Noninvas Low Extrem Art Dopp/Press/Wavefrms (Abi-Ppg) Ltd 1-2 Lvls                  [1]   Past Medical History:  Diagnosis Date    Hyperlipidemia     Hypertension     Insomnia     Irregular heart beat     Restless leg     Sleep apnea    [2]   Past Surgical History:  Procedure Laterality Date    ARTERIAL-  LOWER EXTREMITY  ANGIOGRAPHY POSS PTA Right 11/09/2023    Procedure: Arterial-  Lower Extremity Angiography Poss PTA;  Surgeon: Marinus Longest, MD;  Location: [facility];  Service: Cardiovascular;  Laterality: Right;    ARTERIAL-  LOWER EXTREMITY ANGIOGRAPHY POSS PTA N/A 11/11/2023    Procedure: Arterial-  Lower Extremity Angiography Poss PTA;  Surgeon: Marinus Longest, MD;  Location: [facility];  Service: Cardiovascular;  Laterality: N/A;    BACK SURGERY      FEMORAL-POPLITEAL BYPASS Right 11/12/2023    Procedure: CREATION, BYPASS, ARTERIAL, FEMORAL TO POSTERIOR POPLITEAL; REVERSE SAPHENOUS VEIN BYPASS;  Surgeon: Marinus Longest, MD;  Location: [facility];  Service: Vascular;  Laterality: Right;  10/28-CARD SENT    SPINAL CORD STIMULATOR IMPLANT     [3]   Current Outpatient Medications on File Prior to Visit   Medication Sig Dispense Refill    aspirin  EC 81 MG EC tablet Take 1 tablet (81 mg) by mouth once daily      atorvastatin  (LIPITOR) 80 MG tablet Take 1 tablet (80 mg) by mouth nightly 30 tablet 0    Continuous Glucose Receiver (FreeStyle Libre 3 Reader) Device Use 4 times daily - with meals and at bedtime 1 each 1    Continuous Glucose Sensor (FreeStyle Libre 3 Sensor) Misc Use 1 Application every 14 (fourteen) days 6 each 2    metFORMIN  (GLUCOPHAGE ) 1000 MG tablet Take 1 tablet (1,000 mg) by mouth 2 (two) times daily with meals      metoprolol  tartrate (LOPRESSOR ) 25 MG tablet Take 1 tablet (25 mg) by  mouth 2 (two) times daily Hold for blood pressure lower than 100/60 60 tablet 0    [DISCONTINUED] apixaban  (ELIQUIS ) 2.5 MG tablet Take 1 tablet (2.5 mg) by mouth every 12 (twelve) hours 60 tablet 0    [DISCONTINUED] gabapentin  (NEURONTIN ) 300 MG capsule Take 2 capsules (600 mg) by mouth 3 (three) times daily 72 capsule 0     No current facility-administered medications on file prior to visit.   [4]   Allergies  Allergen Reactions    Strawberry C [Ascorbate] Hives    Strawberry Extract Hives     Lidocaine       Other Reaction(s): Headache/ Heart Palpitations   [5] No family history on file.  [6]   Social History  Socioeconomic History    Marital status: Divorced   Tobacco Use    Smoking status: Every Day     Current packs/day: 1.00     Types: Cigarettes    Smokeless tobacco: Never   Vaping Use    Vaping status: Never Used   Substance and Sexual Activity    Alcohol use: Never    Drug use: Never     Social Drivers of Psychologist, [employer] Strain: High Risk (11/04/2023)    Overall Financial Resource Strain (CARDIA)     Difficulty of Paying Living Expenses: Hard   Food Insecurity: No Food Insecurity (11/04/2023)    Hunger Vital Sign     Worried About Running Out of Food in the Last Year: Never true     Ran Out of Food in the Last Year: Never true   Recent Concern: Food Insecurity - Food Insecurity Present (08/29/2023)    Received from [facility]    Hunger Vital Sign     Within the past 12 months, you worried that your food would run out before you got the money to buy more.: Sometimes true     Within the past 12 months, the food you bought just didn't last and you didn't have money to get more.: Sometimes true   Transportation Needs: No Transportation Needs (11/04/2023)    PRAPARE - Therapist, Art (Medical): No     Lack of Transportation (Non-Medical): No   Recent Concern: Transportation Needs - Unmet Transportation Needs (08/29/2023)    Received from [facility]    [facility] - Transportation     In the past 12 months, has lack of transportation kept you from medical appointments or from getting medications?: Yes     In the past 12 months, has lack of transportation kept you from meetings, work, or from getting things needed for daily living?: Yes   Physical Activity: Inactive (04/22/2021)    Received from [facility]    Exercise Vital Sign     On average, how many days per week do you engage in moderate  to strenuous exercise (like a brisk walk)?: 0 days     On average, how many minutes do you engage in exercise at this level?: 0 min   Stress: No Stress Concern Present (01/15/2022)    Received from [facility]    [facility] - Occupational Stress Questionnaire     Feeling of Stress : Only a little   Social Connections: Socially Isolated (01/15/2022)    Received from [facility]    Social Connection and Isolation Panel     In  a typical week, how many times do you talk on the phone with family, friends, or neighbors?: More than three times a week     How often do you get together with friends or relatives?: Never     How often do you attend church or religious services?: Never     Do you belong to any clubs or organizations such as church groups, unions, fraternal or athletic groups, or school groups?: No     How often do you attend meetings of the clubs or organizations you belong to?: Never     Are you married, widowed, divorced, separated, never married, or living with a partner?: Separated   Intimate Partner Violence: Not At Risk (11/04/2023)    Humiliation, Afraid, Rape, and Kick questionnaire     Fear of Current or Ex-Partner: No     Emotionally Abused: No     Physically Abused: No     Sexually Abused: No   Housing Stability: Not At Risk (11/04/2023)    Housing Stability NCSS     Do you have housing?: Yes     Are you worried about losing your housing?: No

## 2023-12-09 NOTE — Patient Instructions (Signed)
 Mac Macintosh, MD. Schedule an appointment as soon as possible for a visit.    Specialty: Infectious Disease  Contact information:  [address]  [place] [address]  [phone]

## 2023-12-10 ENCOUNTER — Ambulatory Visit (INDEPENDENT_AMBULATORY_CARE_PROVIDER_SITE_OTHER): Admitting: Urology

## 2023-12-10 ENCOUNTER — Encounter (INDEPENDENT_AMBULATORY_CARE_PROVIDER_SITE_OTHER): Payer: Self-pay | Admitting: Urology

## 2023-12-10 VITALS — BP 139/82 | HR 84 | Temp 98.7°F

## 2023-12-10 DIAGNOSIS — R3914 Feeling of incomplete bladder emptying: Secondary | ICD-10-CM

## 2023-12-10 DIAGNOSIS — N3289 Other specified disorders of bladder: Secondary | ICD-10-CM | POA: Insufficient documentation

## 2023-12-10 DIAGNOSIS — N401 Enlarged prostate with lower urinary tract symptoms: Secondary | ICD-10-CM

## 2023-12-10 DIAGNOSIS — Z978 Presence of other specified devices: Secondary | ICD-10-CM | POA: Insufficient documentation

## 2023-12-10 NOTE — Patient Instructions (Signed)
 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  Plan to maintain your foley catheter to gravity for now.  At this point, I recommend proceeding with a cystoscopy, transurethral resection of bladder tumor, and possible instillation of Gemcitabine.  This is done under anesthesia, as an outpatient.  You will be contacted by one of our surgical schedulers with possible surgery dates.

## 2023-12-10 NOTE — Progress Notes (Signed)
 Subjective:   Patient ID: Scott Monroe. is a 78 y.o. male     Chief Complaint:    79 y/o M presents today with recent urinary retention, hematuria, and concern for malignancy with bladder wall thickening on imaging.  Scott Monroe states that the hematuria has resolved.  He has recently undergone vascular bypass surgery, as well as partial toe amputation for osteomyelitis.  Scott Monroe has smoked cigarettes since he was 14.  Was previously smoking over 1 pack per day, but is now under 1 pack per day.  Here to discuss management of foley catheter, as well as bladder abnormality.    The following portions of the patient's history were reviewed and updated as appropriate: allergies, current medications, past family history, past medical history, past social history, past surgical history and problem list.    Review of Systems  History obtained from the patient  General ROS: negative for - chills, fatigue, fever or weight loss  Psychological ROS: negative for - depression, disorientation or memory difficulties  Ophthalmic ROS: negative for - blurry vision or loss of vision  Hematological and Lymphatic ROS: negative for - bleeding problems, night sweats or swollen lymph nodes  Endocrine ROS: negative for - malaise/lethargy, polydipsia/polyuria or skin changes  Respiratory ROS: no cough, shortness of breath, or wheezing  Cardiovascular ROS: no chest pain or dyspnea on exertion  Gastrointestinal ROS: no abdominal pain, change in bowel habits, or black or bloody stools  Genito-Urinary ROS: as per HPI  Musculoskeletal ROS: negative for - joint pain or muscle pain  Neurological ROS: negative for - headaches, impaired coordination/balance or numbness/tingling    Objective:   BP 139/82 (BP Site: Left arm, Patient Position: Sitting, Cuff Size: Large)   Pulse 84   Temp 98.7 F (37.1 C) (Oral)   General appearance - alert, well appearing, and in no distress  Mental status - alert, oriented to person, place, and time,  appropriate affect  Chest - no use of accessory respiratory muscles    Lab Review   None    Radiology Review   CT imaging reviewed.  Assessment:     1. Benign prostatic hyperplasia with incomplete bladder emptying    2. Bladder wall thickening    3. 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  Plan to maintain your foley catheter to gravity for now.  At this point, I recommend proceeding with a cystoscopy, transurethral resection of bladder tumor, and possible instillation of Gemcitabine.  This is done under anesthesia, as an outpatient.  You will be contacted by one of our surgical schedulers with possible surgery dates.    Orders  No orders of the defined types were placed in this encounter.

## 2023-12-13 ENCOUNTER — Encounter (INDEPENDENT_AMBULATORY_CARE_PROVIDER_SITE_OTHER): Payer: Self-pay

## 2023-12-13 DIAGNOSIS — N3289 Other specified disorders of bladder: Secondary | ICD-10-CM | POA: Insufficient documentation

## 2023-12-13 DIAGNOSIS — N139 Obstructive and reflux uropathy, unspecified: Principal | ICD-10-CM | POA: Insufficient documentation

## 2023-12-15 LAB — LAB USE ONLY - CULTURE, ACID FAST BACILLI (AFB/MYCOBACTERIA): Culture Acid Fast Bacillus (AFB): NO GROWTH

## 2023-12-16 ENCOUNTER — Telehealth (INDEPENDENT_AMBULATORY_CARE_PROVIDER_SITE_OTHER): Payer: Self-pay | Admitting: Internal Medicine

## 2023-12-16 NOTE — Telephone Encounter (Signed)
 PA started. Sent to plan.

## 2023-12-16 NOTE — Telephone Encounter (Signed)
 Dexcom G6 Sensor Likely not required   Dexcom G6 Transmitter Likely not required   Dexcom G7 Receiver Likely not required   Dexcom G7 Sensor Likely not required   Enlite Glucose Sensor Likely not required   FreeStyle Tees Toh 2 Reader Likely not required   [employer] 3 Reader Likely not required   FreeStyle Lite Likely not required   Guardian 4 Glucose Sensor Likely not required   Guardian Link 3 Transmitter Likely not required   Omnipod 5 DexG7G6 Pods Gen 5 Likely not required   Simplera Sensor Likely not required   Simplera Sync Sensor Likely not required   Simplera System Likely not required   T:slim X2 Insulin  Pump Likely not required   Tandem Mobi System Starter Likely not required   Dexcom G6 Receiver Likely required   FreeStyle Libre 14 Day Reader Likely required   Guardian 4 Transmitter Likely required   [employer] Likely required   Guardian Sensor 3 Likely required

## 2023-12-19 ENCOUNTER — Encounter (INDEPENDENT_AMBULATORY_CARE_PROVIDER_SITE_OTHER): Payer: Self-pay

## 2023-12-19 LAB — LAB USE ONLY - CULTURE, ACID FAST BACILLI (AFB/MYCOBACTERIA): Culture Acid Fast Bacillus (AFB): NO GROWTH

## 2023-12-19 NOTE — Progress Notes (Signed)
????????????????????????????????????????????????????????????????    Patient: Scott Monroe, Scott Monroe (MRN: [id number])  ????????????????????????????????????????????????????????????????  THIS FORM IS FOR INTERNAL USE FOR VASCULAR SURGERY AND IS NOT A PROGRESS NOTE    Appt Date/Time: 12/25/23 @ 10:30 am                             Room:    78 y.o. male    PCP: Edmon Lash, MD                                    Referring Physician:   Preferred Language: English    Appointment Type: Follow Up  Imaging: US  Today  ?  LE art dplx and ABIs    Chief Complaint/HPI: 2 weeks f/u S/P fem to posterior pop saphenous vein bypass done 11/12/23 by Dr. Marinus.       Date of Last Visit: 12/09/2023 OV w/ PA Melissa     Most Recent Vascular Surgery History:  11/12/2023: Right creation fem to post tibial artery bypass using reversed GSV by Dr. Marinus   11/11/2023: Arterial LE angiography poss PTA by Dr. Marinus   11/09/2023: Right LE Angiography poss PTA by Dr. Marinus     Medications:  Aspirin , Eliquis /Apixaban , Metformin , and Atorvastatin     GLP1: None    Focused Prior Medical History: A-Fib, HLD, HTN, and PAD    Social Determinants of Health:  ?? What Matters Most:  ?? ETOH: Complete  ?? Depression: Complete  ?? Tobacco Use: Current Smoker    Vital Signs this visit                                    Pulses this visit  HT  BP R BP L Temp   Rad Ulnar Brach Fem Pop DP PT        R          WT  Pulse R Pulse L SpO2       L            Allergies: Allergies[1]       [1]   Allergies  Allergen Reactions    Strawberry C [Ascorbate] Hives    Strawberry Extract Hives    Lidocaine       Other Reaction(s): Headache/ Heart Palpitations

## 2023-12-26 ENCOUNTER — Encounter (INDEPENDENT_AMBULATORY_CARE_PROVIDER_SITE_OTHER): Payer: Self-pay | Admitting: Physician Assistant

## 2023-12-26 ENCOUNTER — Ambulatory Visit (INDEPENDENT_AMBULATORY_CARE_PROVIDER_SITE_OTHER): Admitting: Physician Assistant

## 2023-12-26 ENCOUNTER — Other Ambulatory Visit (INDEPENDENT_AMBULATORY_CARE_PROVIDER_SITE_OTHER)

## 2023-12-26 VITALS — BP 133/84 | HR 73 | Temp 97.5°F | Ht 68.0 in | Wt 138.0 lb

## 2023-12-26 DIAGNOSIS — Z9889 Other specified postprocedural states: Secondary | ICD-10-CM

## 2023-12-26 DIAGNOSIS — I739 Peripheral vascular disease, unspecified: Secondary | ICD-10-CM

## 2023-12-26 DIAGNOSIS — Z95828 Presence of other vascular implants and grafts: Secondary | ICD-10-CM

## 2023-12-26 NOTE — Progress Notes (Signed)
 OFFICE VISIT  Vascular and Endovascular Surgery      Date: December 26, 2023  Vascular Surgeon: Janyce Ovens, MD  Chief Complaint:    Chief Complaint   Patient presents with    Post-op      2 weeks f/u S/P fem to posterior pop saphenous vein bypass done 11/12/23 by Dr. Ovens.        History of Present Illness     Scott Roker. is a 78 y.o. male who is status post right EIA stent placement on 10/25, followed by right femoral-popliteal bypass with rGSV done on 11/12/23 by Dr. 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 Patient was discharged on ASA and Eliquis .      Post operatively he overall healed well. Today he continues to do well from a vascular standpoint. He is ambulating with assistance given his abnormal gait/balance. He reports hypersensitivity along his left great toe without wound development. He reports being told it is possible gout. He occasionally develops aching pain in his left foot pain that wakes him up at night. This is not severe and is tolerable. He is accompanied today by his daughter.      He remains compliant with ASA, Eliquis , and Atorvastatin .     PMH includes HLD, HTN, DM II, atrial fibrillation, tobacco abuse, neuropathy, bladder mass, urinary retention with chronic foley catheter.     Past Medical History     Medical History[1]    Past Surgical History     Past Surgical History[2]     Medications     Medications Ordered Prior to Encounter[3]    Allergies     Allergies[4]     Family History     Family History[5]    Social History      Social History[6]    Review of Systems   A comprehensive twelve point review of systems was: Negative in the neurologic, respiratory, cardiac, vascular, gastrointestinal, genitourinary, musculoskeletal, immunologic, psychiatric or endocrinologic systems except as mentioned above in HPI.       Physical Exam     Vitals:    12/26/23 1042 12/26/23 1044   BP: (!) 134/91 133/84   BP Site: Right  arm Right arm   Patient Position: Sitting Sitting   Cuff Size: Medium Medium   Pulse: 73    Temp: 97.5 F (36.4 C)    TempSrc: Temporal    SpO2: 98%    Weight: 62.6 kg (138 lb)    Height: 1.727 m (5' 8)       Body mass index is 20.98 kg/m.    Physical Exam  Vitals reviewed.   Constitutional:       Appearance: Normal appearance.   HENT:      Head: Normocephalic and atraumatic.   Pulmonary:      Effort: Pulmonary effort is normal.   Musculoskeletal:      Right lower leg: No edema.      Left lower leg: No edema.   Skin:     General: Skin is warm.      Findings: Wound (right hallux amp site healed) present.      Comments: Cool left foot     Neurological:      General: No focal deficit present.      Mental Status: He is alert and oriented to person, place, and time.   Psychiatric:         Mood and Affect: Mood normal.  Behavior: Behavior normal.       Labs       CBC:   WBC   Date/Time Value Ref Range Status   11/14/2023 08:46 AM 7.36 3.10 - 9.50 x10 3/uL Final   04/21/2021 12:18 PM 7.70 3.10 - 9.50 x10 3/uL Final     RBC   Date/Time Value Ref Range Status   11/14/2023 08:46 AM 2.90 (L) 4.20 - 5.90 x10 6/uL Final   04/21/2021 12:18 PM 5.72 4.20 - 5.90 x10 6/uL Final     Hemoglobin   Date/Time Value Ref Range Status   11/14/2023 08:46 AM 8.1 (L) 12.5 - 17.1 g/dL Final     Hgb   Date/Time Value Ref Range Status   04/21/2021 12:18 PM 15.0 12.5 - 17.1 g/dL Final     Hematocrit   Date/Time Value Ref Range Status   11/14/2023 08:46 AM 25.3 (L) 37.6 - 49.6 % Final   04/21/2021 12:18 PM 46.4 37.6 - 49.6 % Final     MCV   Date/Time Value Ref Range Status   11/14/2023 08:46 AM 87.2 78.0 - 96.0 fL Final   04/21/2021 12:18 PM 81.1 78.0 - 96.0 fL Final     MCHC   Date/Time Value Ref Range Status   11/14/2023 08:46 AM 32.0 31.5 - 35.8 g/dL Final   95/92/7976 87:81 PM 32.3 31.5 - 35.8 g/dL Final     RDW   Date/Time Value Ref Range Status   11/14/2023 08:46 AM 16 (H) 11 - 15 % Final   04/21/2021 12:18 PM 15 11 - 15 % Final      Platelet Count   Date/Time Value Ref Range Status   11/14/2023 08:46 AM 198 142 - 346 x10 3/uL Final     Platelets   Date/Time Value Ref Range Status   04/21/2021 12:18 PM 191 142 - 346 x10 3/uL Final       CMP:   Sodium   Date/Time Value Ref Range Status   11/14/2023 04:21 AM 138 135 - 145 mEq/L Final   04/21/2021 12:18 PM 135 135 - 145 mEq/L Final     Potassium   Date/Time Value Ref Range Status   11/14/2023 04:21 AM 4.6 3.5 - 5.3 mEq/L Final   04/21/2021 12:18 PM 4.3 3.5 - 5.3 mEq/L Final     Chloride   Date/Time Value Ref Range Status   11/14/2023 04:21 AM 110 99 - 111 mEq/L Final   04/21/2021 12:18 PM 100 99 - 111 mEq/L Final     CO2   Date/Time Value Ref Range Status   11/14/2023 04:21 AM 22 17 - 29 mEq/L Final   04/21/2021 12:18 PM 25 17 - 29 mEq/L Final     Glucose   Date/Time Value Ref Range Status   11/14/2023 04:21 AM 147 (H) 70 - 100 mg/dL Final     Comment:     ADA Guidelines for diabetes mellitus:  Fasting: Equal to or greater than 126 mg/dL  Random: Equal to or greater than 200 mg/dL   95/92/7976 87:81 PM 712 (H) 70 - 100 mg/dL Final     Comment:     ADA guidelines for diabetes mellitus:  Fasting:  Equal to or greater than 126 mg/dL  Random:   Equal to or greater than 200 mg/dL       BUN   Date/Time Value Ref Range Status   11/14/2023 04:21 AM 24 9 - 28 mg/dL Final   95/92/7976 87:81 PM 16.0 9.0 -  28.0 mg/dL Final     Protein, Total   Date/Time Value Ref Range Status   11/04/2023 11:01 AM 7.2 6.0 - 8.3 g/dL Final   95/92/7976 87:81 PM 6.5 6.0 - 8.3 g/dL Final     Alkaline Phosphatase   Date/Time Value Ref Range Status   11/04/2023 11:01 AM 110 37 - 117 U/L Final   04/21/2021 12:18 PM 146 (H) 37 - 117 U/L Final     AST (SGOT)   Date/Time Value Ref Range Status   11/04/2023 11:01 AM 25 <=41 U/L Final   04/21/2021 12:18 PM 23 5 - 41 U/L Final     ALT   Date/Time Value Ref Range Status   11/04/2023 11:01 AM 17 <=55 U/L Final   04/21/2021 12:18 PM 21 0 - 55 U/L Final     Anion Gap   Date/Time Value Ref  Range Status   11/14/2023 04:21 AM 6.0 5.0 - 15.0 Final     Comment:     Calculated Anion Gap = Na - (Cl + CO2)  Interpret with caution; calculated Anion Gap may not reflect patient's true clinical status.     This is a calculated value and platform-dependent. A value >12.0 has been recommended for the management of Hyperglycemic Crises: Diabetic Ketoacidosis and Hyperglycemic Hyperosmolar State.Med Clin [place]. 2017;101(3):587-606.doi,10.1016/j.mcna.2016.12.011   04/21/2021 12:18 PM 10.0 5.0 - 15.0 Final     Comment:     Calculated AGAP = Na - (CL + CO2)  Interpret with caution; calculated AGAP may not  reflect patient's true clinical status.  This is a calculated value and platform-dependent.  A value >12.0 has been recommended for the management of  Hyperglycemic Crises: Diabetic Ketoacidosis and Hyperglycemic  Hyperosmolar State. Med Clin [place] 2017;101(3):587-606.  doi:10.1016/j.mcna.2016.12.011         Lipid Panel No results found for: CHOL, TRIG, HDL    Coags:   PT   Date/Time Value Ref Range Status   04/16/2021 04:23 PM 11.6 10.1 - 12.9 sec Final     PT INR   Date/Time Value Ref Range Status   04/16/2021 04:23 PM 1.0 0.9 - 1.1 Final     Comment:     Recommended Ranges for Protime INR:  2.0-3.0 for most medical and surgical thromboembolic states  7.4-6.4 for artificial heart valves INR result may not represent  exact Warfarin dosing level during the transition period from  Heparin  to Warfarin therapy. Results should be interpreted based  on current anticoagulant therapy and patient's clinical presentation.       PTT   Date/Time Value Ref Range Status   11/09/2023 03:31 PM 29 27 - 39 sec Final     Comment:     Anti-Xa is the primary test for monitoring heparin  therapy. APTT is the screening test for bleeding disorders/coagulopathies.  APTT may be used as a secondary monitoring of heparin  therapy if Anti-Xa cannot be measured.   04/16/2021 09:17 PM 27 27 - 39 sec Final     Comment:     Anti-Xa is  the primary test for monitoring heparin  therapy. APTT  is the screening test for bleeding disorders/coagulopathies.  APTT may be used as a secondary monitoring of heparin  therapy  if Anti-Xa cannot be measured.         Results were reviewed and discussed with patient.     Diagnostic Imaging     RLE arterial duplex performed in our vascular lab December 26, 2023:  1. Patent right mid femoral-proximal  posterior tibial artery with no evidence of significant stenosis. Patent common femoral, peroneal, posterior, and anterior tibial arteries with no evidence of significant stenosis.    ABI performed in our vascular lab December 26, 2023:  1. The right ankle/brachial index is 0.88, which is consistent with mild arterial disease at rest.  2. Amputated right toe.  3. The left ankle/brachial index is 0.42, which is consistent with moderate to severe arterial disease at rest.  4. Unable to get signal for the left toe/brachial index.    Results were reviewed and discussed with patient.     Assessment and Plan     1. Status post femorotibial bypass    2. PAD (peripheral artery disease)        Scott Karel. is a 78 y.o. male who is status post right femoral-tibial bypass for nonhealing right hallux amputation site. His incision sites have fully healed, as did his right hallux amputation site. RLE arterial studies performed today demonstrates a patent bypass without significant stenosis. Right ABI is 0.88. On exam his right foot is well perfused. Left foot is cool to touch, without wounds or gangrenous changes. Left ABI is 0.42. He has known chronic left SFA occlusion. No vascular intervention is warranted at this time. I recommend he continue ASA, Eliquis  and statin therapy as normal. We will continue closer surveillance in 3 months with repeat BLE arterial studies given left foot pain.     Return in about 3 months (around 03/25/2024) for LE arterial duplex, ABI, follow up with APP.    It was our pleasure to see your  patient in the Vascular Surgery Office today. Please do not hesitate to contact us  if you have any questions or concerns. Thank you for the opportunity to care for your patient.     Sincerely,     Jeferson Boozer A. Rupert RIGGERS  Physician Assistant  [facility] Vascular Surgery  [address]  [place], [place] [address]  T  [phone]  F [phone]    Orders Placed This Encounter    US  Arterial/ Graft Duplex Dopp Low Extrem Bil Comp    US  Noninvas Low Extrem Art Dopp/Press/Wavefrms (Abi-Ppg) Ltd 1-2 Lvls                 [1]   Past Medical History:  Diagnosis Date    Hyperlipidemia     Hypertension     Insomnia     Irregular heart beat     Restless leg     Sleep apnea    [2]   Past Surgical History:  Procedure Laterality Date    ARTERIAL-  LOWER EXTREMITY ANGIOGRAPHY POSS PTA Right 11/09/2023    Procedure: Arterial-  Lower Extremity Angiography Poss PTA;  Surgeon: Marinus Longest, MD;  Location: [facility];  Service: Cardiovascular;  Laterality: Right;    ARTERIAL-  LOWER EXTREMITY ANGIOGRAPHY POSS PTA N/A 11/11/2023    Procedure: Arterial-  Lower Extremity Angiography Poss PTA;  Surgeon: Marinus Longest, MD;  Location: [facility];  Service: Cardiovascular;  Laterality: N/A;    BACK SURGERY      FEMORAL-POPLITEAL BYPASS Right 11/12/2023    Procedure: CREATION, BYPASS, ARTERIAL, FEMORAL TO POSTERIOR POPLITEAL; REVERSE SAPHENOUS VEIN BYPASS;  Surgeon: Marinus Longest, MD;  Location: [facility];  Service: Vascular;  Laterality: Right;  10/28-CARD SENT    SPINAL CORD STIMULATOR IMPLANT     [3]   Current Outpatient Medications on File Prior to Visit   Medication  Sig Dispense Refill    apixaban  (ELIQUIS ) 2.5 MG tablet Take 1 tablet (2.5 mg) by mouth every 12 (twelve) hours 60 tablet 0    aspirin  EC 81 MG EC tablet Take 1 tablet (81 mg) by mouth once daily      atorvastatin  (LIPITOR) 80 MG tablet Take 1 tablet (80 mg) by mouth nightly 30 tablet 0    Continuous Glucose Receiver (FreeStyle  Libre 3 Reader) Device Use 4 times daily - with meals and at bedtime 1 each 1    Continuous Glucose Sensor (FreeStyle Libre 3 Sensor) Misc Use 1 Application every 14 (fourteen) days 6 each 2    gabapentin  (NEURONTIN ) 300 MG capsule Take 2 capsules (600 mg) by mouth 3 (three) times daily 180 capsule 0    metFORMIN  (GLUCOPHAGE ) 1000 MG tablet Take 1 tablet (1,000 mg) by mouth 2 (two) times daily with meals      metoprolol  tartrate (LOPRESSOR ) 25 MG tablet Take 1 tablet (25 mg) by mouth 2 (two) times daily Hold for blood pressure lower than 100/60 60 tablet 0     No current facility-administered medications on file prior to visit.   [4]   Allergies  Allergen Reactions    Strawberry C [Ascorbate] Hives    Strawberry Extract Hives    Lidocaine       Other Reaction(s): Headache/ Heart Palpitations   [5] No family history on file.  [6]   Social History  Socioeconomic History    Marital status: Divorced   Tobacco Use    Smoking status: Every Day     Current packs/day: 0.50     Types: Cigarettes    Smokeless tobacco: Never    Tobacco comments:     17 cigarettes a day   Vaping Use    Vaping status: Never Used   Substance and Sexual Activity    Alcohol use: Never    Drug use: Never     Social Drivers of Psychologist, [employer] Strain: High Risk (11/04/2023)    Overall Financial Resource Strain (CARDIA)     Difficulty of Paying Living Expenses: Hard   Food Insecurity: No Food Insecurity (11/04/2023)    Hunger Vital Sign     Worried About Running Out of Food in the Last Year: Never true     Ran Out of Food in the Last Year: Never true   Recent Concern: Food Insecurity - Food Insecurity Present (08/29/2023)    Received from [facility]    Hunger Vital Sign     Within the past 12 months, you worried that your food would run out before you got the money to buy more.: Sometimes true     Within the past 12 months, the food you bought just didn't last and you didn't have money to get more.: Sometimes true    Transportation Needs: No Transportation Needs (11/04/2023)    PRAPARE - Therapist, Art (Medical): No     Lack of Transportation (Non-Medical): No   Recent Concern: Transportation Needs - Unmet Transportation Needs (08/29/2023)    Received from [facility]    [facility] - Transportation     In the past 12 months, has lack of transportation kept you from medical appointments or from getting medications?: Yes     In the past 12 months, has lack of transportation kept you from meetings, work, or from getting things needed for daily living?: Yes  Physical Activity: Inactive (04/22/2021)    Received from [facility]    Exercise Vital Sign     On average, how many days per week do you engage in moderate to strenuous exercise (like a brisk walk)?: 0 days     On average, how many minutes do you engage in exercise at this level?: 0 min   Stress: No Stress Concern Present (01/15/2022)    Received from [facility]    [facility] - Occupational Stress Questionnaire     Feeling of Stress : Only a little   Social Connections: Socially Isolated (01/15/2022)    Received from [place] [facility]    Social Connection and Isolation Panel     In a typical week, how many times do you talk on the phone with family, friends, or neighbors?: More than three times a week     How often do you get together with friends or relatives?: Never     How often do you attend church or religious services?: Never     Do you belong to any clubs or organizations such as church groups, unions, fraternal or athletic groups, or school groups?: No     How often do you attend meetings of the clubs or organizations you belong to?: Never     Are you married, widowed, divorced, separated, never married, or living with a partner?: Separated   Intimate Partner Violence: Not At Risk (11/04/2023)    Humiliation, Afraid, Rape,  and Kick questionnaire     Fear of Current or Ex-Partner: No     Emotionally Abused: No     Physically Abused: No     Sexually Abused: No   Housing Stability: Not At Risk (11/04/2023)    Housing Stability NCSS     Do you have housing?: Yes     Are you worried about losing your housing?: No

## 2023-12-30 ENCOUNTER — Ambulatory Visit: Attending: Family | Admitting: Family

## 2023-12-30 ENCOUNTER — Encounter (INDEPENDENT_AMBULATORY_CARE_PROVIDER_SITE_OTHER): Payer: Self-pay | Admitting: Family

## 2023-12-30 ENCOUNTER — Encounter (INDEPENDENT_AMBULATORY_CARE_PROVIDER_SITE_OTHER)

## 2023-12-30 ENCOUNTER — Ambulatory Visit (INDEPENDENT_AMBULATORY_CARE_PROVIDER_SITE_OTHER): Admitting: Family

## 2023-12-30 VITALS — BP 127/77 | HR 69 | Ht 68.0 in | Wt 138.0 lb

## 2023-12-30 DIAGNOSIS — I5032 Chronic diastolic (congestive) heart failure: Secondary | ICD-10-CM

## 2023-12-30 DIAGNOSIS — Z8674 Personal history of sudden cardiac arrest: Secondary | ICD-10-CM | POA: Insufficient documentation

## 2023-12-30 DIAGNOSIS — N3289 Other specified disorders of bladder: Secondary | ICD-10-CM

## 2023-12-30 DIAGNOSIS — D649 Anemia, unspecified: Secondary | ICD-10-CM

## 2023-12-30 DIAGNOSIS — I10 Essential (primary) hypertension: Secondary | ICD-10-CM | POA: Insufficient documentation

## 2023-12-30 DIAGNOSIS — N1832 Chronic kidney disease, stage 3b: Secondary | ICD-10-CM | POA: Insufficient documentation

## 2023-12-30 DIAGNOSIS — I480 Paroxysmal atrial fibrillation: Secondary | ICD-10-CM

## 2023-12-30 DIAGNOSIS — F17200 Nicotine dependence, unspecified, uncomplicated: Secondary | ICD-10-CM | POA: Insufficient documentation

## 2023-12-30 DIAGNOSIS — I739 Peripheral vascular disease, unspecified: Secondary | ICD-10-CM

## 2023-12-30 DIAGNOSIS — E119 Type 2 diabetes mellitus without complications: Secondary | ICD-10-CM | POA: Insufficient documentation

## 2023-12-30 DIAGNOSIS — G4733 Obstructive sleep apnea (adult) (pediatric): Secondary | ICD-10-CM | POA: Insufficient documentation

## 2023-12-30 DIAGNOSIS — Z01818 Encounter for other preprocedural examination: Secondary | ICD-10-CM

## 2023-12-30 DIAGNOSIS — Z9289 Personal history of other medical treatment: Secondary | ICD-10-CM | POA: Insufficient documentation

## 2023-12-30 DIAGNOSIS — I2510 Atherosclerotic heart disease of native coronary artery without angina pectoris: Secondary | ICD-10-CM

## 2023-12-30 LAB — TYPE AND SCREEN
ABO Rh: A POS
Antibody Screen: NEGATIVE

## 2023-12-30 LAB — LAB USE ONLY - CBC WITH DIFFERENTIAL
Absolute Basophils: 0.09 x10 3/uL — ABNORMAL HIGH (ref 0.00–0.08)
Absolute Eosinophils: 0.35 x10 3/uL (ref 0.00–0.44)
Absolute Immature Granulocytes: 0.03 x10 3/uL (ref 0.00–0.07)
Absolute Lymphocytes: 1.63 x10 3/uL (ref 0.42–3.22)
Absolute Monocytes: 0.56 x10 3/uL (ref 0.21–0.85)
Absolute Neutrophils: 5.86 x10 3/uL (ref 1.10–6.33)
Absolute nRBC: 0 x10 3/uL (ref <=0.00)
Basophils %: 1.1 %
Eosinophils %: 4.1 %
Hematocrit: 37.9 % (ref 37.6–49.6)
Hemoglobin: 11.9 g/dL — ABNORMAL LOW (ref 12.5–17.1)
Immature Granulocytes %: 0.4 %
Lymphocytes %: 19.1 %
MCH: 28.1 pg (ref 25.1–33.5)
MCHC: 31.4 g/dL — ABNORMAL LOW (ref 31.5–35.8)
MCV: 89.4 fL (ref 78.0–96.0)
MPV: 9.4 fL (ref 8.9–12.5)
Monocytes %: 6.6 %
Neutrophils %: 68.7 %
Platelet Count: 226 x10 3/uL (ref 142–346)
Preliminary Absolute Neutrophil Count: 5.86 x10 3/uL (ref 1.10–6.33)
RBC: 4.24 x10 6/uL (ref 4.20–5.90)
RDW: 16 % — ABNORMAL HIGH (ref 11–15)
WBC: 8.52 x10 3/uL (ref 3.10–9.50)
nRBC %: 0 /100{WBCs} (ref <=0.0)

## 2023-12-30 LAB — BASIC METABOLIC PANEL
Anion Gap: 9 (ref 5.0–15.0)
BUN: 49 mg/dL — ABNORMAL HIGH (ref 9–28)
CO2: 22 meq/L (ref 17–29)
Calcium: 9.3 mg/dL (ref 7.9–10.2)
Chloride: 109 meq/L (ref 99–111)
Creatinine: 2 mg/dL — ABNORMAL HIGH (ref 0.5–1.5)
GFR: 34.2 mL/min/1.73 m2 — ABNORMAL LOW (ref >=60.0)
Glucose: 147 mg/dL — ABNORMAL HIGH (ref 70–100)
Hemolysis Index: 0 {index}
Potassium: 5.2 meq/L (ref 3.5–5.3)
Sodium: 140 meq/L (ref 135–145)

## 2023-12-30 LAB — FERRITIN: Ferritin: 44.5 ng/mL (ref 21.80–274.70)

## 2023-12-30 LAB — IRON PROFILE
Iron Saturation: 13 % — ABNORMAL LOW (ref 15–50)
Iron: 36 ug/dL — ABNORMAL LOW (ref 41–168)
TIBC: 278 ug/dL (ref 261–462)
UIBC: 242 ug/dL (ref 126–382)

## 2023-12-30 LAB — PRESURGICAL FORM, REFLEX

## 2023-12-30 NOTE — PEC In-Person Visit (H&P) (Addendum)
 Pre-Anesthesia Evaluation     Pre-op Interview visit requested by:   Reason for pre-op interview visit: Patient anticipating CYSTOSCOPY, TRANSURETHAL RESECTION, BLADDER NEOPLASM, GEMCITABINE procedure.         History of Present Illness/Summary:  Patient presents to the [facility] clinic for a pre-operative evaluation.    Assessment/Plan:    1.  Encounter for pre-operative examination [Z01.818]    Orders Placed This Encounter   Procedures    CT SCREENING LUNG LOW DOSE    Basic Metabolic Panel    CBC with Differential (Order)    Iron Profile    Ferritin    Type and Screen    CBC with Differential (Component)    Presurgical Form, Reflex     2.  Surgical Diagnosis:  Bladder mass [N32.89]    3.  PAD  He is followed by Vascular   Status post right EIA stent placement on 10/25, followed by right femoral-popliteal bypass with rGSV done on 11/12/23 by Dr. Marinus Salines message sent to surgical team to ensure they are okay with him holding his eliquis  and they are okay with him holding x 48 hours.     RLE arterial duplex performed in vascular lab December 26, 2023:  1. Patent right mid femoral-proximal posterior tibial artery with no evidence of significant stenosis. Patent common femoral, peroneal, posterior, and anterior tibial arteries with no evidence of significant stenosis.     ABI performed in vascular lab December 26, 2023:  1. The right ankle/brachial index is 0.88, which is consistent with mild arterial disease at rest.  2. Amputated right toe.  3. The left ankle/brachial index is 0.42, which is consistent with moderate to severe arterial disease at rest.  4. Unable to get signal for the left toe/brachial index.    4.  HFpEF (recovered EF)  Review of echocardiogram from 08/31/2023 with EF 55-60% (improved from 20-25% 01/15/2022), grade 1 DD.   He is euvolemic on exam today. He denies SOB, wheezing, PND, orthopnea, or increased BLE swelling.     Cardiology pre-op 12/31/2023:   In terms of his upcoming urologic  procedure, he has preop clinical risk factors of PVD, CHF, DM, CRI, adequate functional capacity, and awaiting likely moderate surgical risk procedure.  No further cardiac workup is recommended to further risk stratify patient prior to his surgery as it will not change his perioperative risk or perioperative medical management.  Given his risk factors, he is considered above average risk for surgery, but appears optimized from a cardiac standpoint to proceed if HRs are controlled, and as long as he continues his cardiac medications perioperatively including Amlodipine , Atorvastatin , and Lopressor .  The Aspirin  and Eliquis  (for Eliquis  at least 48 hrs prior to procedure due to CRI) can be held preoperatively , but I recommend restarting ASA and Eliquis  post-operatively once okay by Dr. Ina from a post-op bleeding standpoint.      5. Atrial Fibrillation  He has not seen cardiology in years. He is seeing them tomorrow 12/30/2023  CHADs-Vasc = 5  He is on eliquis  2.5 mg BID and metoprolol      6. Diabetes  Medically managed with Metformin    A1c 7.3 11/04/2023     7. Anemia  8. History of blood transfusion   H/H 8.1/25.3 11/14/2023 in the setting for sugery for his PAD  Will check PPAE today   He received 2 units of PRBC during surgery. Will check T&S today to r/o antibodies      9.  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     11. Coronary Artery Disease  12. History of Cardiac Arrest?   Incidentally seen on CT 08/28/2023  He is on ASA 81 mg and statin therapy.   DASI 4.5, METS 3.3. He denies any cardiopulmonary symptoms.    History of cardiac arres in 2016 during SCS placement. No issues with any other procedures requiring GA   Will attempt to get records. This was done at [facility].   He is scheduled to see cardiology tomorrow on  12/31/2023.     Addendum 12/31/2023: reviewed anesthesia records from [place] in 2016 - no evidence of cardiac arrest on anesthesia records.     13. OSA  Discussed increased risk for perioperative cardiopulmonary complications r/t OSA, specifically within the first 72 hours post-op.  Recommend monitoring per hospital OSA protocol perioperatively.      14. Current Smoker  Reinforced the importance of not smoking during the perioperative period, as well as long term cessation. Smoking can increase risk of CV and  pulmonary complications, as well as increase risk for infection and delayed healing.    Patient voices understanding of risks. He declines smoking cessation with nicotine  patches and gum   He has never had a LDLCT. Will order today.     Patient is at elevated risk for perioperative CV complications 2/2 CAD, HFpEF, current smoker, OSA, CKD, HTN, however, not prohibitive to proceeding with planned surgery or procedure.    Patient voiced understanding of all instructions.  All questions and concerns addressed at this time. This assessment will be conveyed to the surgery and anesthesiology teams & the patient will be evaluated the morning of surgery.    Problem List:  Medical Problems       Hospital Problem List  Date Reviewed: 12/31/2023   None        Non-Hospital Problem List  Date Reviewed: 12/31/2023          ICD-10-CM Priority Class Noted Diagnosed    Atrial fibrillation (CMS/HCC) I48.91   08/19/2020     Chronic heart failure with preserved ejection fraction (HFpEF) (CMS/HCC) I50.32   04/19/2021     Benign prostatic hyperplasia with incomplete bladder emptying N40.1, R39.14   04/19/2021     PAD (peripheral artery disease) I73.9   11/04/2023     Indwelling Foley catheter present Z97.8   12/10/2023     History of blood transfusion Z92.89   12/30/2023     Type 2 diabetes mellitus without complication, without long-term current use of insulin  (CMS/HCC) E11.9   12/30/2023     Coronary artery disease involving  native coronary artery of native heart without angina pectoris I25.10   12/30/2023     Primary hypertension I10   12/30/2023     Current smoker F17.200   12/30/2023     OSA (obstructive sleep apnea) G47.33   12/30/2023     Stage 3b chronic kidney disease (CMS/HCC) N18.32   12/30/2023     Acute on chronic systolic (congestive) heart failure (CMS/HCC) I50.23   12/31/2023         Medical History   Diagnosis Date    Hyperlipidemia     Hypertension     Insomnia     Irregular heart beat  Restless leg     Sleep apnea      Past Surgical History[1]     Medication List            Accurate as of December 30, 2023 11:59 PM. Always use your most recent med list.                apixaban  2.5 MG tablet  Take 1 tablet (2.5 mg) by mouth every 12 (twelve) hours  Commonly known as: ELIQUIS   Medication Adjustments for Surgery: Stop 2 days before surgery     aspirin  EC 81 MG EC tablet  Take 1 tablet (81 mg) by mouth once daily  Medication Adjustments for Surgery: Take as prescribed     atorvastatin  80 MG tablet  Take 1 tablet (80 mg) by mouth nightly  Commonly known as: LIPITOR  Medication Adjustments for Surgery: Take as prescribed     FreeStyle Libre 3 Reader Espiridion  Use 4 times daily - with meals and at bedtime  Medication Adjustments for Surgery: Take as prescribed     FreeStyle Libre 3 Sensor Misc  Use 1 Application every 14 (fourteen) days  Medication Adjustments for Surgery: Take as prescribed     gabapentin  300 MG capsule  Take 2 capsules (600 mg) by mouth 3 (three) times daily  Commonly known as: NEURONTIN   Medication Adjustments for Surgery: Take as prescribed     metFORMIN  1000 MG tablet  Take 1 tablet (1,000 mg) by mouth 2 (two) times daily with meals  Commonly known as: GLUCOPHAGE   Medication Adjustments for Surgery: Take as prescribed     metoprolol  tartrate 25 MG tablet  Take 1 tablet (25 mg) by mouth 2 (two) times daily Hold for blood pressure lower than 100/60  Commonly known as: LOPRESSOR              Allergies[2]  Social History     Occupational History    Not on file   Tobacco Use    Smoking status: Every Day     Current packs/day: 1.00     Average packs/day: 1 pack/day for 64.0 years (64.0 ttl pk-yrs)     Types: Cigarettes     Start date: 1962    Smokeless tobacco: Never   Vaping Use    Vaping status: Never Used   Substance and Sexual Activity    Alcohol use: Never    Drug use: Never    Sexual activity: Not on file           Exam Scores:   SDB score  OSA Risk Category: No Risk        STBUR score       PONV score  Nausea Risk: LOW RISK    MST score  MST Score: 0    PEN-FAST score       Frailty score  CFS Score: 5    MICA  MICA %: 0.26    RCRI score  RCRI Score: 2    DASI  DASI Score: 4.5  METs Level: 3.3    EA-DIVA  EA-DIVA Score: 1    CHADsVasc  CHADSVASC Score: 5         Visit Vitals  BP 127/77   Pulse 69   Ht 1.727 m (5' 8)   Wt 62.6 kg (138 lb)   SpO2 100%   BMI 20.98 kg/m        Review of Systems   Constitutional: Negative.    HENT: Negative.  Eyes: Negative.         Needs glasses   Respiratory: Negative.     Cardiovascular: Negative.    Gastrointestinal: Negative.    Genitourinary:         Catheter   Musculoskeletal: Negative.    Skin: Negative.    Neurological: Negative.    Endo/Heme/Allergies: Negative.    Psychiatric/Behavioral: Negative.         Physical Exam:  Mallampati: III  TM distance: > 3 FB (> 6 cm)  Mouth opening: > 3 FB (> 6 cm)  Neck extension: full  Upper lip bite assessment: class I  (-) enlarged neck circumference    Denture status: edentulous and upper dentures    Mental status: alert and oriented x3    Heart rhythm: irregularly irregular  no murmur:    (-) a murmur and peripheral edema    Breath sounds clear to auscultation bilaterally            Recent Labs   CBC (last 180 days) 11/14/23  0846 12/30/23  0840   WBC 7.36 8.52   RBC 2.90* 4.24   Hemoglobin 8.1* 11.9*   Hematocrit 25.3* 37.9   MCV 87.2 89.4   MCH 27.9 28.1   MCHC 32.0 31.4*   RDW 16* 16*   Platelet Count 198 226    MPV 9.5 9.4   nRBC % 0.0 0.0   Absolute nRBC 0.00 0.00     Recent Labs   BMP (last 180 days) 11/14/23  0421 12/30/23  0840   Glucose 147* 147*   BUN 24 49*   Creatinine 1.7* 2.0*   Sodium 138 140   Potassium 4.6 5.2   Chloride 110 109   CO2 22 22   Calcium  8.5 9.3   Anion Gap 6.0 9.0   GFR 41.5* 34.2*     Recent Labs   Coag Panel (last 180 days) 11/09/23  1531   PTT 29     Recent Labs   Other (last 180 days) 11/04/23  1101 12/30/23  0840   Albumin  3.9  --    Bilirubin, Total 0.2  --    ALT 17  --    AST (SGOT) 25  --    Protein, Total 7.2  --    Hemoglobin A1C 7.3*  --    Iron  --  36*   Ferritin  --  44.50   Iron Saturation  --  13*     Recent Labs   Type & Screen (last 34 days) 12/30/23  0840   ABO Rh A Pos   Antibody Screen Negative         Echo 08/31/2023:   Normal left ventricular size and systolic function with no obvious   regional wall motion abnormalities.    The estimated ejection fraction is 55-60%.    Normal right ventricular size and systolic function.    No significant valvular abnormalities within the limits of the study.    There is no evidence of pulmonary artery systolic pressure elevation.   Compared to 01/15/2022, LVEF is improved.     Anesthesia Plan:  ASA 3   Anesthetic Options Discussed: General  Potential anesthesia/Perioperative problems: Increased Risk of Cardiac Event    DNR status: Patient or surrogate requests that DNR status remain full code.  Discussed DNR status with: Patient      A discussion with regarding next steps to prepare for the procedure and the planned anesthesia care took place during today's  visit.  I explained that the patient will meet with their anesthesiology providers on the DOS.  Discussed with Patient      Acceptability of blood products: Accepted  Use of blood products discussed with Patient                                                               [1]   Past Surgical History:  Procedure Laterality Date    ARTERIAL-  LOWER EXTREMITY ANGIOGRAPHY POSS PTA Right  11/09/2023    Procedure: Arterial-  Lower Extremity Angiography Poss PTA;  Surgeon: Marinus Longest, MD;  Location: [facility];  Service: Cardiovascular;  Laterality: Right;    ARTERIAL-  LOWER EXTREMITY ANGIOGRAPHY POSS PTA N/A 11/11/2023    Procedure: Arterial-  Lower Extremity Angiography Poss PTA;  Surgeon: Marinus Longest, MD;  Location: [facility];  Service: Cardiovascular;  Laterality: N/A;    BACK SURGERY  2016    FEMORAL-POPLITEAL BYPASS Right 11/12/2023    Procedure: CREATION, BYPASS, ARTERIAL, FEMORAL TO POSTERIOR POPLITEAL; REVERSE SAPHENOUS VEIN BYPASS;  Surgeon: Marinus Longest, MD;  Location: [place] [facility];  Service: Vascular;  Laterality: Right;  10/28-CARD SENT    SPINAL CORD STIMULATOR IMPLANT  2016   [2]   Allergies  Allergen Reactions    Strawberry C [Ascorbate] Hives    Strawberry Extract Hives    Lidocaine       Other Reaction(s): Headache/ Heart Palpitations

## 2023-12-31 ENCOUNTER — Ambulatory Visit (INDEPENDENT_AMBULATORY_CARE_PROVIDER_SITE_OTHER)

## 2023-12-31 ENCOUNTER — Encounter (INDEPENDENT_AMBULATORY_CARE_PROVIDER_SITE_OTHER): Payer: Self-pay

## 2023-12-31 VITALS — BP 122/80 | HR 66 | Ht 68.0 in | Wt 128.0 lb

## 2023-12-31 DIAGNOSIS — I5023 Acute on chronic systolic (congestive) heart failure: Secondary | ICD-10-CM | POA: Insufficient documentation

## 2023-12-31 DIAGNOSIS — I513 Intracardiac thrombosis, not elsewhere classified: Secondary | ICD-10-CM

## 2023-12-31 DIAGNOSIS — Z136 Encounter for screening for cardiovascular disorders: Secondary | ICD-10-CM

## 2023-12-31 DIAGNOSIS — I5042 Chronic combined systolic (congestive) and diastolic (congestive) heart failure: Secondary | ICD-10-CM

## 2023-12-31 DIAGNOSIS — I739 Peripheral vascular disease, unspecified: Secondary | ICD-10-CM

## 2023-12-31 DIAGNOSIS — Z7901 Long term (current) use of anticoagulants: Secondary | ICD-10-CM

## 2023-12-31 DIAGNOSIS — I4510 Unspecified right bundle-branch block: Secondary | ICD-10-CM

## 2023-12-31 DIAGNOSIS — Z01810 Encounter for preprocedural cardiovascular examination: Secondary | ICD-10-CM

## 2023-12-31 DIAGNOSIS — I482 Chronic atrial fibrillation, unspecified: Secondary | ICD-10-CM

## 2023-12-31 DIAGNOSIS — F17210 Nicotine dependence, cigarettes, uncomplicated: Secondary | ICD-10-CM

## 2023-12-31 LAB — ECG 12-LEAD
Q-T Interval: 308 ms
QRS Duration: 130 ms
QTC Calculation (Bezet): 453 ms
R Axis: -87 degrees
T Axis: 53 degrees
Ventricular Rate: 130 {beats}/min

## 2023-12-31 MED ORDER — METOPROLOL TARTRATE 50 MG PO TABS: 50.0000 mg | ORAL_TABLET | Freq: Two times a day (BID) | ORAL | 3 refills | Status: AC

## 2023-12-31 MED ORDER — AMLODIPINE BESYLATE 5 MG PO TABS: 5.0000 mg | ORAL_TABLET | Freq: Every day | ORAL | 3 refills | Status: AC

## 2023-12-31 NOTE — Progress Notes (Signed)
 Cardiology Progress Note    Patient Name: Scott Monroe.  DOB:  07-Feb-1945    Age:  78 y.o.   MRN: [id number]   PCP: Edmon Lash, MD   Date of service: December 31, 2023         Problem List:     Problem List[1]      HPI:     Verbal consent from the patient was obtained to record this visit.     History of Present Illness  Delia Sitar. 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      He has atrial fibrillation and heart failure, with prior hospitalizations noting his heart function was as low as 20%. He has not seen a cardiologist regularly due to lack of a regular doctor and financial constraints. He was recently hospitalized for a prostate, osteomyelitis of the right foot s/p toe amputation and right fem-pop bypass in 10/2023.     He feels his heartbeats are normal and does not experience any palpitations. He is currently taking metoprolol  and a blood thinner, Eliquis , but has run out of amlodipine . He denies any recent chest pain, chest pressure, shortness of breath, N/V, diaphoresis, palpitations, orthopnea, PND, or LE swelling.      He smokes continues to smoke.  He does not currently monitor his blood pressure at home due to cost but plans to purchase a device next month. He recalls being told he had blood clots in his heart in the past, which have resolved with Eliquis .    He has not seen a kidney doctor yet, although he believes he is planned to do so.      Past Medical History:     Medical History[2]         Surgical History:     Past Surgical History[3]       Family History:     Family History[4]         Social History:      Social History[5]       Home Medications:     Current outpatient medications:  Current Outpatient Medications   Medication Instructions    amLODIPine  (NORVASC ) 5 mg, Oral, Daily, Pt ran out of this medication    apixaban  (ELIQUIS ) 2.5 mg, Oral, Every 12 hours scheduled    aspirin  EC (ASPIRIN  EC) 81 mg, Daily    atorvastatin  (LIPITOR) 80 mg, Oral, At bedtime    Continuous Glucose Receiver (FreeStyle Libre 3 Reader) Device Does not apply, 4 times daily - before meals and at bedtime    Continuous Glucose Sensor (FreeStyle Libre 3 Sensor) Misc 1 Application, Does not apply, Every 14 days    gabapentin  (NEURONTIN ) 600 mg, Oral, 3 times daily    metFORMIN  (GLUCOPHAGE ) 1,000 mg, 2 times daily with meals    metoprolol  tartrate (LOPRESSOR ) 50 mg, Oral, 2 times daily, Hold for blood pressure lower than 100/60        Allergies     Allergies[6]         Objective:     BP 122/80 (BP Site: Left arm, Patient Position: Sitting, Cuff Size: Medium)   Pulse 66   Ht 1.727  m (5' 8)   Wt 58.1 kg (128 lb)   SpO2 100%   BMI 19.46 kg/m      Constitutional:  Alert, elderly, orient x 3, not anxious, no acute distress  Neck:  Supple, no JVD, no carotid bruits.  Lungs:  Coarse breath sounds.  No crackles.  Cardio:  Irregular irregular rhythm.  1/6 murmur.  No gallops or rubs.  Extremities:  No edema.  Healing scar on right medial leg.        Labs:     CMP:  Lab Results   Component Value Date    NA 140 12/30/2023    K 5.2 12/30/2023    MG 2.0 04/19/2021    BUN 49 (H) 12/30/2023    CREAT 2.0 (H) 12/30/2023    GLU 147 (H) 12/30/2023    AST 25 11/04/2023    ALT 17 11/04/2023    CBC:  Lab Results   Component Value Date    WBC 8.52 12/30/2023    HGB 11.9 (L) 12/30/2023    HCT 37.9 12/30/2023    PLT 226 12/30/2023        Cardiac Enzymes:  Lab Results   Component Value Date    TROPI 29.6 04/21/2021    TROPI 30.1 04/17/2021    TROPI -2.8 04/17/2021    BNP 5,638 (H) 04/21/2021       Lipid Panel:  No results found for: CHOL  No results found  for: TRIG  No results found for: HDL  No results found for: LDL Hemoglobin A1C:  Lab Results   Component Value Date    HGBA1C 7.3 (H) 11/04/2023    HGBA1C >14.0 (H) 04/17/2021       Thyroid  Studies:  Lab Results   Component Value Date    TSH 0.98 04/16/2021     No results found for: T4FREE         Radiology:     No results found.        Cardiac studies:     ECG  EKG Results       Procedure Component Value Units Date/Time    ECG 12 lead [[id number]] Collected: 12/31/23 1407     Updated: 12/31/23 1414     Ventricular Rate 130 BPM      Atrial Rate -- BPM      P-R Interval -- ms      QRS Duration 130 ms      Q-T Interval 308 ms      QTC Calculation (Bezet) 453 ms      P Axis -- degrees      R Axis -87 degrees      T Axis 53 degrees      [facility] MUSE NARRATIVE AND IMPRESSION --     ATRIAL FIBRILLATION WITH RAPID VENTRICULAR RESPONSE  LEFT AXIS DEVIATION  RIGHT BUNDLE BRANCH BLOCK  ABNORMAL ECG  WHEN COMPARED WITH ECG OF 05-Nov-2023 10:03,  VENTRICULAR RATE HAS INCREASED by  49 bpm  QRS AXIS SHIFTED LEFT  T WAVE INVERSION NOW EVIDENT IN ANTERIOR LEADS  NONSPECIFIC T WAVE ABNORMALITY NO LONGER EVIDENT IN LATERAL LEADS  Confirmed by Howell Sharper [phone]) on 12/31/2023 2:14:13 PM      Narrative:      ATRIAL FIBRILLATION WITH RAPID VENTRICULAR RESPONSE  LEFT AXIS DEVIATION  RIGHT BUNDLE BRANCH BLOCK  ABNORMAL ECG  WHEN COMPARED WITH ECG OF 05-Nov-2023 10:03,  VENTRICULAR RATE HAS INCREASED by  49 bpm  QRS AXIS SHIFTED LEFT  T WAVE  INVERSION NOW EVIDENT IN ANTERIOR LEADS  NONSPECIFIC T WAVE ABNORMALITY NO LONGER EVIDENT IN LATERAL LEADS  Confirmed by Howell Sharper [phone]) on 12/31/2023 2:14:13 PM                Echo 01/2022 at [facility]:    Left Ventricle: Normal cavity size. There is mild concentric   hypertrophy present. Ejection fraction is 20 - 25%.     Right Ventricle: Normal cavity size. The ejection fraction is   moderately reduced.     Pericardium: Small pericardial effusion.     No significant valve disease.        Echo Transthoracic Adult Complete 08/31/2023 at [facility]:  Normal left ventricular size and systolic function with no obvious  regional wall motion abnormalities.  The estimated ejection fraction is 55-60%.  Normal right ventricular size and systolic function.  No significant valvular abnormalities within the limits of the study.  There is no evidence of pulmonary artery systolic pressure elevation.  Compared to 01/15/2022, LVEF is improved.      Assessment:     1. Chronic atrial fibrillation (CMS/HCC)    2. Chronic combined systolic and diastolic CHF (congestive heart failure) (CMS/HCC)    3. Left ventricular thrombus    4. RBBB (right bundle branch block)    5. Long term current use of anticoagulant    6. PVD (peripheral vascular disease)    7. Preoperative cardiovascular examination        Plan:     Patient presents today to establish cardiology care after recent hospitalization due to PVD s/p right fem-pop bypass and toe amputation.  It appears he is planned for cystoscopy in the coming weeks by Dr. Ina.  Review of his cardiac history appears he had nonischemic CM with EF as low as 20% in 2022 which was felt to be due to tachycardia mediated with normalized EF on echo most recently in 08/2023.  He previously had an LV thrombus which appears to have resolved as well.  He has no signs of volume overload on exam and appears compensated from a CHF standpoint.    His HRs on EKG is elevated although he attributes to having to walk from parking lot to the 10th floor office.  HRs seems better on exams but still tachycardic to some degree.  I will increase Lopressor  to 50mg  BID.  He will continue Amlodipine , renal dose Eliquis  for CVA prophylaxis, ASA, and Atorvastatin .     He is encouraged to arrange appt to see nephrology given his renal insufficiency.      In terms of his upcoming urologic procedure, he has preop clinical risk factors of PVD, CHF, DM, CRI, adequate functional capacity, and awaiting likely moderate  surgical risk procedure.  No further cardiac workup is recommended to further risk stratify patient prior to his surgery as it will not change his perioperative risk or perioperative medical management.  Given his risk factors, he is considered above average risk for surgery, but appears optimized from a cardiac standpoint to proceed if HRs are controlled, and as long as he continues his cardiac medications perioperatively including Amlodipine , Atorvastatin , and Lopressor .  The Aspirin  and Eliquis  (for Eliquis  at least 48 hrs prior to procedure due to CRI) can be held preoperatively , but I recommend restarting ASA and Eliquis  post-operatively once okay by Dr. Ina from a post-op bleeding standpoint.      Patient was strongly encouraged to consider smoking cessation.    Tobacco Dependence:  Tobacco  Use History[7]  Ready to quit: No  Counseling given: Yes      Smoking Cessation Counseling:  Date: December 31, 2023  Advice/Assessment: Patient strongly urged to quit tobacco use.   RELEVANCE - patient counseled on how quitting may be personally relevant to the following topics:  leading a longer and better quality of life and decreased risk of cardiopulmonary disease  RISKS -  patient counseled on the following risks: long term risks (including MI, CVA, COPD, lung cancer, oropharynx cancer, esophageal cancer, and long term disability)  Total counseling time: 3 to 10 minutes        Follow-up:     Followup with me in 3 months, or sooner if needed.      Ozell Lien, MD, [facility]   Cardiology  Electronically signed 12/31/2023, 2:47 PM    I saw and examined the patient and spent 45 minutes providing care on the day of service including time spent in chart and data review, documentation, ordering, speaking with the patient, and communicating with the care team.            [1]   Patient Active Problem List  Diagnosis    Atrial fibrillation (CMS/HCC)    Chronic heart failure with preserved ejection fraction (HFpEF)  (CMS/HCC)    Benign prostatic hyperplasia with incomplete bladder emptying    PAD (peripheral artery disease)    Indwelling Foley catheter present    History of blood transfusion    Type 2 diabetes mellitus without complication, without long-term current use of insulin  (CMS/HCC)    Coronary artery disease involving native coronary artery of native heart without angina pectoris    Primary hypertension    Current smoker    OSA (obstructive sleep apnea)    Stage 3b chronic kidney disease (CMS/HCC)    Acute on chronic systolic (congestive) heart failure (CMS/HCC)   [2]   Past Medical History:  Diagnosis Date    Hyperlipidemia     Hypertension     Insomnia     Irregular heart beat     Restless leg     Sleep apnea    [3]   Past Surgical History:  Procedure Laterality Date    ARTERIAL-  LOWER EXTREMITY ANGIOGRAPHY POSS PTA Right 11/09/2023    Procedure: Arterial-  Lower Extremity Angiography Poss PTA;  Surgeon: Marinus Longest, MD;  Location: [facility];  Service: Cardiovascular;  Laterality: Right;    ARTERIAL-  LOWER EXTREMITY ANGIOGRAPHY POSS PTA N/A 11/11/2023    Procedure: Arterial-  Lower Extremity Angiography Poss PTA;  Surgeon: Marinus Longest, MD;  Location: [facility];  Service: Cardiovascular;  Laterality: N/A;    BACK SURGERY  2016    FEMORAL-POPLITEAL BYPASS Right 11/12/2023    Procedure: CREATION, BYPASS, ARTERIAL, FEMORAL TO POSTERIOR POPLITEAL; REVERSE SAPHENOUS VEIN BYPASS;  Surgeon: Marinus Longest, MD;  Location: [facility];  Service: Vascular;  Laterality: Right;  10/28-CARD SENT    SPINAL CORD STIMULATOR IMPLANT  2016   [4]   Family History  Problem Relation Name Age of Onset    Diabetes Mother      Liver cancer Mother      Cirrhosis Father      Diabetes Father      Brain cancer Sister      Heart attack Sister      Kidney failure Brother     [5]   Social History  Socioeconomic History    Marital status: Divorced   Tobacco Use  Smoking status: Every Day     Current  packs/day: 1.00     Average packs/day: 1 pack/day for 64.0 years (64.0 ttl pk-yrs)     Types: Cigarettes     Start date: 1962    Smokeless tobacco: Never   Vaping Use    Vaping status: Never Used   Substance and Sexual Activity    Alcohol use: Never    Drug use: Never     Social Drivers of Psychologist, [employer] Strain: High Risk (11/04/2023)    Overall Financial Resource Strain (CARDIA)     Difficulty of Paying Living Expenses: Hard   Food Insecurity: No Food Insecurity (11/04/2023)    Hunger Vital Sign     Worried About Running Out of Food in the Last Year: Never true     Ran Out of Food in the Last Year: Never true   Recent Concern: Food Insecurity - Food Insecurity Present (08/29/2023)    Received from [facility]    Hunger Vital Sign     Within the past 12 months, you worried that your food would run out before you got the money to buy more.: Sometimes true     Within the past 12 months, the food you bought just didn't last and you didn't have money to get more.: Sometimes true   Transportation Needs: No Transportation Needs (11/04/2023)    PRAPARE - Therapist, Art (Medical): No     Lack of Transportation (Non-Medical): No   Recent Concern: Transportation Needs - Unmet Transportation Needs (08/29/2023)    Received from [facility]    [facility] - Transportation     In the past 12 months, has lack of transportation kept you from medical appointments or from getting medications?: Yes     In the past 12 months, has lack of transportation kept you from meetings, work, or from getting things needed for daily living?: Yes   Physical Activity: Sufficiently Active (12/31/2023)    Exercise Vital Sign     Days of Exercise per Week: 7 days     Minutes of Exercise per Session: 60 min   Stress: No Stress Concern Present (01/15/2022)    Received from [facility]    [facility] - Occupational  Stress Questionnaire     Feeling of Stress : Only a little   Social Connections: Socially Isolated (01/15/2022)    Received from [facility]    Social Connection and Isolation Panel     In a typical week, how many times do you talk on the phone with family, friends, or neighbors?: More than three times a week     How often do you get together with friends or relatives?: Never     How often do you attend church or religious services?: Never     Do you belong to any clubs or organizations such as church groups, unions, fraternal or athletic groups, or school groups?: No     How often do you attend meetings of the clubs or organizations you belong to?: Never     Are you married, widowed, divorced, separated, never married, or living with a partner?: Separated   Intimate Partner Violence: Not At Risk (11/04/2023)    Humiliation, Afraid, Rape, and Kick questionnaire     Fear of Current or Ex-Partner: No     Emotionally Abused: No  Physically Abused: No     Sexually Abused: No   Housing Stability: Not At Risk (11/04/2023)    Housing Stability NCSS     Do you have housing?: Yes     Are you worried about losing your housing?: No   [6]   Allergies  Allergen Reactions    Strawberry C [Ascorbate] Hives    Strawberry Extract Hives    Lidocaine       Other Reaction(s): Headache/ Heart Palpitations   [7]   Social History  Tobacco Use   Smoking Status Every Day    Current packs/day: 1.00    Average packs/day: 1 pack/day for 64.0 years (64.0 ttl pk-yrs)    Types: Cigarettes    Start date: 1962   Smokeless Tobacco Never

## 2024-01-13 ENCOUNTER — Encounter (INDEPENDENT_AMBULATORY_CARE_PROVIDER_SITE_OTHER): Payer: Self-pay | Admitting: Internal Medicine

## 2024-01-13 ENCOUNTER — Encounter
Admission: AD | Disposition: A | Discharge status: Home Health | Payer: Self-pay | Source: Home / Self Care | Attending: Urology

## 2024-01-13 ENCOUNTER — Ambulatory Visit: Admitting: Certified Registered Nurse Anesthetist

## 2024-01-13 ENCOUNTER — Encounter: Payer: Self-pay | Admitting: Urology

## 2024-01-13 ENCOUNTER — Inpatient Hospital Stay
Admission: AD | Admit: 2024-01-13 | Discharge: 2024-01-17 | DRG: 666 | Disposition: A | Discharge status: Home Health | Attending: Urology | Admitting: Urology

## 2024-01-13 ENCOUNTER — Ambulatory Visit: Payer: Self-pay | Admitting: Certified Registered Nurse Anesthetist

## 2024-01-13 DIAGNOSIS — N1832 Chronic kidney disease, stage 3b: Secondary | ICD-10-CM | POA: Diagnosis present

## 2024-01-13 DIAGNOSIS — Z7984 Long term (current) use of oral hypoglycemic drugs: Secondary | ICD-10-CM

## 2024-01-13 DIAGNOSIS — Z7982 Long term (current) use of aspirin: Secondary | ICD-10-CM

## 2024-01-13 DIAGNOSIS — G2581 Restless legs syndrome: Secondary | ICD-10-CM | POA: Diagnosis present

## 2024-01-13 DIAGNOSIS — E119 Type 2 diabetes mellitus without complications: Secondary | ICD-10-CM

## 2024-01-13 DIAGNOSIS — Z7901 Long term (current) use of anticoagulants: Secondary | ICD-10-CM

## 2024-01-13 DIAGNOSIS — D62 Acute posthemorrhagic anemia: Secondary | ICD-10-CM | POA: Diagnosis not present

## 2024-01-13 DIAGNOSIS — N1330 Unspecified hydronephrosis: Secondary | ICD-10-CM | POA: Diagnosis not present

## 2024-01-13 DIAGNOSIS — E1122 Type 2 diabetes mellitus with diabetic chronic kidney disease: Secondary | ICD-10-CM | POA: Diagnosis present

## 2024-01-13 DIAGNOSIS — R310 Gross hematuria: Principal | ICD-10-CM | POA: Diagnosis present

## 2024-01-13 DIAGNOSIS — I130 Hypertensive heart and chronic kidney disease with heart failure and stage 1 through stage 4 chronic kidney disease, or unspecified chronic kidney disease: Secondary | ICD-10-CM | POA: Diagnosis present

## 2024-01-13 DIAGNOSIS — C679 Malignant neoplasm of bladder, unspecified: Principal | ICD-10-CM | POA: Diagnosis present

## 2024-01-13 DIAGNOSIS — N3289 Other specified disorders of bladder: Secondary | ICD-10-CM

## 2024-01-13 DIAGNOSIS — F17210 Nicotine dependence, cigarettes, uncomplicated: Secondary | ICD-10-CM | POA: Diagnosis present

## 2024-01-13 DIAGNOSIS — N139 Obstructive and reflux uropathy, unspecified: Principal | ICD-10-CM

## 2024-01-13 DIAGNOSIS — I5032 Chronic diastolic (congestive) heart failure: Secondary | ICD-10-CM | POA: Diagnosis present

## 2024-01-13 DIAGNOSIS — R339 Retention of urine, unspecified: Secondary | ICD-10-CM | POA: Diagnosis present

## 2024-01-13 DIAGNOSIS — Z79899 Other long term (current) drug therapy: Secondary | ICD-10-CM

## 2024-01-13 DIAGNOSIS — E785 Hyperlipidemia, unspecified: Secondary | ICD-10-CM | POA: Diagnosis present

## 2024-01-13 DIAGNOSIS — Z95828 Presence of other vascular implants and grafts: Secondary | ICD-10-CM

## 2024-01-13 DIAGNOSIS — Q6479 Other congenital malformations of bladder and urethra: Secondary | ICD-10-CM

## 2024-01-13 HISTORY — PX: CYSTOSCOPY, TRANSURETHAL RESECTION, BLADDER TUMOR: SHX3615

## 2024-01-13 LAB — BASIC METABOLIC PANEL
Anion Gap: 6 (ref 5.0–15.0)
BUN: 23 mg/dL (ref 9–28)
CO2: 26 meq/L (ref 17–29)
Calcium: 8.9 mg/dL (ref 7.9–10.2)
Chloride: 104 meq/L (ref 99–111)
Creatinine: 1.4 mg/dL (ref 0.5–1.5)
GFR: 52.1 mL/min/1.73 m2 — ABNORMAL LOW (ref >=60.0)
Glucose: 210 mg/dL — ABNORMAL HIGH (ref 70–100)
Potassium: 5.1 meq/L (ref 3.5–5.3)
Sodium: 136 meq/L (ref 135–145)

## 2024-01-13 LAB — PT/INR
INR: 0.9 (ref 0.9–1.1)
PT: 11 s (ref 10.1–12.9)

## 2024-01-13 LAB — CBC
Absolute nRBC: 0 x10 3/uL (ref <=0.00)
Hematocrit: 37.8 % (ref 37.6–49.6)
Hemoglobin: 12.2 g/dL — ABNORMAL LOW (ref 12.5–17.1)
MCH: 27.7 pg (ref 25.1–33.5)
MCHC: 32.3 g/dL (ref 31.5–35.8)
MCV: 85.9 fL (ref 78.0–96.0)
MPV: 9.4 fL (ref 8.9–12.5)
Platelet Count: 212 x10 3/uL (ref 142–346)
RBC: 4.4 x10 6/uL (ref 4.20–5.90)
RDW: 16 % — ABNORMAL HIGH (ref 11–15)
WBC: 10.22 x10 3/uL — ABNORMAL HIGH (ref 3.10–9.50)
nRBC %: 0 /100{WBCs} (ref <=0.0)

## 2024-01-13 LAB — WHOLE BLOOD GLUCOSE POCT
Whole Blood Glucose POCT: 144 mg/dL — ABNORMAL HIGH (ref 70–100)
Whole Blood Glucose POCT: 199 mg/dL — ABNORMAL HIGH (ref 70–100)

## 2024-01-13 MED ORDER — ACETAMINOPHEN 500 MG PO TABS: 500.0000 mg | ORAL_TABLET | ORAL | Status: DC | PRN

## 2024-01-13 MED ORDER — GLYCINE 1.5 % IR SOLN
Status: DC | PRN
  Administered 2024-01-13 (×2): 6000 mL
  Administered 2024-01-13: 3000 mL
  Administered 2024-01-13 (×2): 6000 mL

## 2024-01-13 MED ORDER — LACTATED RINGERS IV SOLN
INTRAVENOUS | Status: AC
  Filled 2024-01-13: qty 1000

## 2024-01-13 MED ORDER — MIDAZOLAM HCL 1 MG/ML IJ SOLN (WRAP)
INTRAMUSCULAR | Status: AC
  Filled 2024-01-13: qty 2

## 2024-01-13 MED ORDER — OXYCODONE-ACETAMINOPHEN 5-325 MG PO TABS
1.0000 | ORAL_TABLET | Freq: Once | ORAL | Status: AC | PRN
  Administered 2024-01-13: 1 via ORAL

## 2024-01-13 MED ORDER — ATORVASTATIN CALCIUM 80 MG PO TABS
80.0000 mg | ORAL_TABLET | Freq: Every evening | ORAL | Status: DC
  Administered 2024-01-13 – 2024-01-16 (×4): 80 mg via ORAL
  Filled 2024-01-13 (×5): qty 1

## 2024-01-13 MED ORDER — HYDROMORPHONE HCL 1 MG/ML IJ SOLN: 0.5000 mg | INTRAMUSCULAR | Status: DC | PRN

## 2024-01-13 MED ORDER — ONDANSETRON HCL 4 MG/2ML IJ SOLN
INTRAMUSCULAR | Status: DC | PRN
  Administered 2024-01-13: 4 mg via INTRAVENOUS

## 2024-01-13 MED ORDER — DEXAMETHASONE SODIUM PHOSPHATE 20 MG/5ML IJ SOLN
INTRAMUSCULAR | Status: AC
  Filled 2024-01-13: qty 5

## 2024-01-13 MED ORDER — LIDOCAINE 4 % EX CREAM (WRAP): TOPICAL_CREAM | Freq: Once | CUTANEOUS | Status: DC | PRN

## 2024-01-13 MED ORDER — PHENYLEPHRINE 100 MCG/ML IV BOLUS (ANESTHESIA)
PREFILLED_SYRINGE | INTRAVENOUS | Status: DC | PRN
  Administered 2024-01-13 (×2): 100 ug via INTRAVENOUS

## 2024-01-13 MED ORDER — GABAPENTIN 300 MG PO CAPS: 600.0000 mg | ORAL_CAPSULE | Freq: Three times a day (TID) | ORAL | Status: DC

## 2024-01-13 MED ORDER — OXYCODONE HCL 5 MG PO TABS
5.0000 mg | ORAL_TABLET | ORAL | Status: DC | PRN
  Administered 2024-01-15 – 2024-01-16 (×5): 5 mg via ORAL
  Filled 2024-01-13 (×5): qty 1

## 2024-01-13 MED ORDER — SODIUM CHLORIDE 0.9 % IV SOLN
75.0000 mL/h | INTRAVENOUS | Status: AC
  Administered 2024-01-13: 75 mL/h via INTRAVENOUS
  Filled 2024-01-13: qty 1000

## 2024-01-13 MED ORDER — SUCCINYLCHOLINE CHLORIDE 20 MG/ML IJ SOLN
INTRAMUSCULAR | Status: AC
  Filled 2024-01-13: qty 10

## 2024-01-13 MED ORDER — ONDANSETRON HCL 4 MG/2ML IJ SOLN
INTRAMUSCULAR | Status: AC
  Filled 2024-01-13: qty 2

## 2024-01-13 MED ORDER — SODIUM CHLORIDE 0.9 % IV SOLN
2000.0000 mg | Freq: Once | INTRAVENOUS | Status: DC
  Filled 2024-01-13: qty 52.6

## 2024-01-13 MED ORDER — GABAPENTIN 300 MG PO CAPS
ORAL_CAPSULE | ORAL | Status: AC
  Administered 2024-01-13: 600 mg via ORAL
  Filled 2024-01-13: qty 2

## 2024-01-13 MED ORDER — STERILE WATER FOR INJECTION IJ/IV SOLN (WRAP)
2.0000 g | Freq: Once | Status: AC
  Administered 2024-01-13: 2 g via INTRAVENOUS

## 2024-01-13 MED ORDER — ESMOLOL HCL 100 MG/10ML IV SOLN
INTRAVENOUS | Status: DC | PRN
  Administered 2024-01-13 (×4): 10 mg via INTRAVENOUS
  Administered 2024-01-13 (×3): 20 mg via INTRAVENOUS

## 2024-01-13 MED ORDER — PROPOFOL 10 MG/ML IV EMUL (WRAP)
INTRAVENOUS | Status: AC
  Filled 2024-01-13: qty 20

## 2024-01-13 MED ORDER — OXYBUTYNIN CHLORIDE 5 MG PO TABS
5.0000 mg | ORAL_TABLET | Freq: Three times a day (TID) | ORAL | Status: DC | PRN
  Administered 2024-01-13: 5 mg via ORAL

## 2024-01-13 MED ORDER — SUGAMMADEX SODIUM 200 MG/2ML IV SOLN
INTRAVENOUS | Status: AC
  Filled 2024-01-13: qty 2

## 2024-01-13 MED ORDER — METOCLOPRAMIDE HCL 5 MG/ML IJ SOLN: 10.0000 mg | Freq: Once | INTRAMUSCULAR | Status: DC | PRN

## 2024-01-13 MED ORDER — CEFAZOLIN SODIUM 2 G IJ SOLR
INTRAMUSCULAR | Status: AC
  Filled 2024-01-13: qty 2000

## 2024-01-13 MED ORDER — ESMOLOL HCL 100 MG/10ML IV SOLN
INTRAVENOUS | Status: AC
  Filled 2024-01-13: qty 10

## 2024-01-13 MED ORDER — FENTANYL CITRATE (PF) 50 MCG/ML IJ SOLN (WRAP)
INTRAMUSCULAR | Status: AC
  Filled 2024-01-13: qty 2

## 2024-01-13 MED ORDER — SUGAMMADEX SODIUM 200 MG/2ML IV SOLN
INTRAVENOUS | Status: DC | PRN
  Administered 2024-01-13: 200 mg via INTRAVENOUS

## 2024-01-13 MED ORDER — LABETALOL HCL 5 MG/ML IV SOLN (WRAP)
INTRAVENOUS | Status: AC
  Filled 2024-01-13: qty 4

## 2024-01-13 MED ORDER — METOPROLOL TARTRATE 25 MG PO TABS
ORAL_TABLET | ORAL | Status: AC
  Administered 2024-01-13: 50 mg via ORAL
  Filled 2024-01-13: qty 2

## 2024-01-13 MED ORDER — FENTANYL CITRATE (PF) 50 MCG/ML IJ SOLN (WRAP)
INTRAMUSCULAR | Status: DC | PRN
  Administered 2024-01-13 (×5): 25 ug via INTRAVENOUS

## 2024-01-13 MED ORDER — PROPOFOL INFUSION 10 MG/ML
INTRAVENOUS | Status: DC | PRN
  Administered 2024-01-13: 100 mg via INTRAVENOUS

## 2024-01-13 MED ORDER — ROCURONIUM BROMIDE 50 MG/5ML IV SOLN
INTRAVENOUS | Status: AC
  Filled 2024-01-13: qty 10

## 2024-01-13 MED ORDER — DEXAMETHASONE SODIUM PHOSPHATE 4 MG/ML IJ SOLN (WRAP)
INTRAMUSCULAR | Status: DC | PRN
  Administered 2024-01-13: 4 mg via INTRAVENOUS

## 2024-01-13 MED ORDER — ROCURONIUM BROMIDE 10 MG/ML IV SOLN (WRAP)
INTRAVENOUS | Status: DC | PRN
  Administered 2024-01-13: 15 mg via INTRAVENOUS
  Administered 2024-01-13: 10 mg via INTRAVENOUS
  Administered 2024-01-13: 25 mg via INTRAVENOUS
  Administered 2024-01-13: 10 mg via INTRAVENOUS

## 2024-01-13 MED ORDER — LABETALOL HCL 5 MG/ML IV SOLN (WRAP)
INTRAVENOUS | Status: DC | PRN
  Administered 2024-01-13 (×5): 2.5 mg via INTRAVENOUS

## 2024-01-13 MED ORDER — LIDOCAINE HCL (PF) 2 % IJ SOLN
INTRAMUSCULAR | Status: AC
  Filled 2024-01-13: qty 5

## 2024-01-13 MED ORDER — FENTANYL CITRATE (PF) 50 MCG/ML IJ SOLN (WRAP)
25.0000 ug | INTRAMUSCULAR | Status: DC | PRN
  Administered 2024-01-13 (×2): 25 ug via INTRAVENOUS

## 2024-01-13 MED ORDER — FAMOTIDINE 10 MG/ML IV SOLN (WRAP)
INTRAVENOUS | Status: DC | PRN
  Administered 2024-01-13: 20 mg via INTRAVENOUS

## 2024-01-13 MED ORDER — ONDANSETRON HCL 4 MG/2ML IJ SOLN: 4.0000 mg | Freq: Three times a day (TID) | INTRAMUSCULAR | Status: DC | PRN

## 2024-01-13 MED ORDER — AMLODIPINE BESYLATE 5 MG PO TABS
5.0000 mg | ORAL_TABLET | Freq: Every day | ORAL | Status: DC
  Administered 2024-01-13 – 2024-01-17 (×4): 5 mg via ORAL
  Filled 2024-01-13 (×5): qty 1

## 2024-01-13 MED ORDER — ONDANSETRON HCL 4 MG/2ML IJ SOLN: 4.0000 mg | Freq: Once | INTRAMUSCULAR | Status: DC | PRN

## 2024-01-13 MED ORDER — LACTATED RINGERS IV SOLN: INTRAVENOUS | Status: DC | PRN

## 2024-01-13 MED ORDER — HYDROMORPHONE HCL 2 MG PO TABS: 2.0000 mg | ORAL_TABLET | Freq: Once | ORAL | Status: DC | PRN

## 2024-01-13 MED ORDER — METOPROLOL TARTRATE 50 MG PO TABS
50.0000 mg | ORAL_TABLET | Freq: Two times a day (BID) | ORAL | Status: DC
  Administered 2024-01-14 – 2024-01-17 (×7): 50 mg via ORAL
  Filled 2024-01-13 (×7): qty 1

## 2024-01-13 NOTE — PACU (Signed)
 Patient's heart rate was observed 120-130. Patient denied chest pain and difficulty breathing. Anesthesiologist Dr. Nguyen was notified. No new orders. Receiving RN was notified.

## 2024-01-13 NOTE — H&P (Signed)
 ADMISSION HISTORY AND PHYSICAL EXAM    Date Time: 01/13/2024 9:01 AM  Patient Name: [name], [name]  Attending Physician: Ina Scott FALCON, MD    Assessment:   AUR  Gross Hematuria  Bladder Abnormality    Plan:   To OR for Cysto, TURBT, Instillation of Gemcitabine     Additional Diagnoses:               History of Present Illness:   Scott Antonio Sigmund. is a 78 y.o. male who presents to the hospital with  recent urinary retention, hematuria, and concern for malignancy with bladder wall thickening on imaging. Scott Monroe states that the hematuria has resolved. He has recently undergone vascular bypass surgery, as well as partial toe amputation for osteomyelitis. Scott Monroe has smoked cigarettes since he was 14. Was previously smoking over 1 pack per day, but is now under 1 pack per day.  Here for cysto, TURBT, instillation of Gemcitabine     Past Medical History:   Medical History[1]    Past Surgical History:   Past Surgical History[2]    Family History:   Family History[3]    Social History:   Social History[4]    Allergies:   Allergies[5]    Medications:     Medications Prior to Admission   Medication Sig    amLODIPine  (NORVASC ) 5 MG tablet Take 1 tablet (5 mg) by mouth once daily Pt ran out of this medication    apixaban  (ELIQUIS ) 2.5 MG tablet Take 1 tablet (2.5 mg) by mouth every 12 (twelve) hours    aspirin  EC 81 MG EC tablet Take 1 tablet (81 mg) by mouth once daily    atorvastatin  (LIPITOR) 80 MG tablet Take 1 tablet (80 mg) by mouth nightly    Continuous Glucose Receiver (FreeStyle Libre 3 Reader) Device Use 4 times daily - with meals and at bedtime (Patient not taking: Reported on 12/31/2023)    Continuous Glucose Sensor (FreeStyle Libre 3 Sensor) Misc Use 1 Application every 14 (fourteen) days (Patient not taking: Reported on 12/31/2023)    gabapentin  (NEURONTIN ) 300 MG capsule Take 2 capsules (600 mg) by mouth 3 (three) times daily    metFORMIN  (GLUCOPHAGE ) 1000 MG tablet Take 1 tablet (1,000 mg) by  mouth 2 (two) times daily with meals    metoprolol  tartrate (LOPRESSOR ) 50 MG tablet Take 1 tablet (50 mg) by mouth 2 (two) times daily Hold for blood pressure lower than 100/60       Review of Systems:   A comprehensive review of systems was: Negative    Physical Exam:   There were no vitals filed for this visit.    Intake and Output Summary (Last 24 hours) at Date Time  No intake or output data in the 24 hours ending 01/13/24 0901    AFVSS  NAD, A/O x 3  RRR  CTAB  Abdomen benign    Labs:          Recent CBC No results for input(s): RBC, HGB, HCT, MCV, MCH, MCHC, RDW, MPV, LABPLAT in the last 24 hours.    Invalid input(s): WHITEBLOODCE, NRBCA, REFLX, ANRBA  Recent BMP No results for input(s): GLU, BUN, CREAT, CA, NA, K, CL, CO2 in the last 24 hours.    Invalid input(s): AGAP  Recent CMP No results for input(s): GLU, BUN, CREAT, NA, CK, CL, CO2, CA, ALB, AST, ALT, ALP, GLOB in the last 24 hours.    Invalid input(s): TP, BILIT, AG, AGAP  Recent  URINALYSIS Invalid input(s): UMACB, UTYP, UCOLR, UAPP, USPG, UPH, ULEU, UNIT, UPRO, UGLU, UKET, UURO, UBIL, UBLD, UTYPP  Recent URINALYSIS WITH MICROSCOPIC Invalid input(s): UMICB, UWBC, URBC, USEPI, UREPI, UTEPI, UTRPH, UAMOR, UCAOX, URACY, UCAPH, UCABC, UWBCC, UBAC, UTRIC, UABCY, UCHOL, [place], ULEUC, UTYRO, UCYS, USULF, [address], [address], [address], [address], [address], [address], [place], UHYCS, [place], [place], [place], [place], [place], [place], [place], UCELL  Recent BLOOD CULTURE No results for input(s): CXBLD in the last 24 hours.  Recent URINE CULTURE Invalid input(s): CXURN  Recent PT/PTT No results for input(s): INR, PTT in the last 24 hours.    Invalid input(s): PTI, COUM, COUMP, ACOAG, ACOAP  Recent PT/INR No results for input(s): INR in the last 24 hours.    Invalid input(s): PTI, COUM,  COUMP    Rads:   Radiological Procedure reviewed.    Signed by: Scott JULIANNA Baptist, MD                   [1]   Past Medical History:  Diagnosis Date    Hyperlipidemia     Hypertension     Insomnia     Irregular heart beat     Restless leg     Sleep apnea    [2]   Past Surgical History:  Procedure Laterality Date    ARTERIAL-  LOWER EXTREMITY ANGIOGRAPHY POSS PTA Right 11/09/2023    Procedure: Arterial-  Lower Extremity Angiography Poss PTA;  Surgeon: Marinus Longest, MD;  Location: [facility];  Service: Cardiovascular;  Laterality: Right;    ARTERIAL-  LOWER EXTREMITY ANGIOGRAPHY POSS PTA N/A 11/11/2023    Procedure: Arterial-  Lower Extremity Angiography Poss PTA;  Surgeon: Marinus Longest, MD;  Location: [facility];  Service: Cardiovascular;  Laterality: N/A;    BACK SURGERY  2016    FEMORAL-POPLITEAL BYPASS Right 11/12/2023    Procedure: CREATION, BYPASS, ARTERIAL, FEMORAL TO POSTERIOR POPLITEAL; REVERSE SAPHENOUS VEIN BYPASS;  Surgeon: Marinus Longest, MD;  Location: [facility];  Service: Vascular;  Laterality: Right;  10/28-CARD SENT    SPINAL CORD STIMULATOR IMPLANT  2016   [3]   Family History  Problem Relation Name Age of Onset    Diabetes Mother      Liver cancer Mother      Cirrhosis Father      Diabetes Father      Brain cancer Sister      Heart attack Sister      Kidney failure Brother     [4]   Social History  Socioeconomic History    Marital status: Divorced   Tobacco Use    Smoking status: Every Day     Current packs/day: 1.00     Average packs/day: 1 pack/day for 64.0 years (64.0 ttl pk-yrs)     Types: Cigarettes     Start date: 1962    Smokeless tobacco: Never   Vaping Use    Vaping status: Never Used   Substance and Sexual Activity    Alcohol use: Never    Drug use: Never     Social Drivers of Psychologist, [employer] Strain: High Risk (11/04/2023)    Overall Financial Resource Strain (CARDIA)     Difficulty of Paying Living Expenses: Hard   Food Insecurity: No Food  Insecurity (11/04/2023)    Hunger Vital Sign     Worried About Running Out of Food in the Last Year: Never true     Ran Out of Food in the Last Year: Never true  Recent Concern: Food Insecurity - Food Insecurity Present (08/29/2023)    [facility]    Hunger Vital Sign     Within the past 12 months, you worried that your food would run out before you got the money to buy more.: Sometimes true     Within the past 12 months, the food you bought just didn't last and you didn't have money to get more.: Sometimes true   Transportation Needs: No Transportation Needs (11/04/2023)    PRAPARE - Therapist, Art (Medical): No     Lack of Transportation (Non-Medical): No   Recent Concern: Transportation Needs - Unmet Transportation Needs (08/29/2023)    Received from [facility]    [facility] - Transportation     In the past 12 months, has lack of transportation kept you from medical appointments or from getting medications?: Yes     In the past 12 months, has lack of transportation kept you from meetings, work, or from getting things needed for daily living?: Yes   Physical Activity: Sufficiently Active (12/31/2023)    Exercise Vital Sign     Days of Exercise per Week: 7 days     Minutes of Exercise per Session: 60 min   Stress: No Stress Concern Present (01/15/2022)    Received from [facility]    [facility] - Occupational Stress Questionnaire     Feeling of Stress : Only a little   Social Connections: Socially Isolated (01/15/2022)    Received from [place] [facility]    Social Connection and Isolation Panel     In a typical week, how many times do you talk on the phone with family, friends, or neighbors?: More than three times a week     How often do you get together with friends or relatives?: Never     How often do you attend church or religious services?: Never      Do you belong to any clubs or organizations such as church groups, unions, fraternal or athletic groups, or school groups?: No     How often do you attend meetings of the clubs or organizations you belong to?: Never     Are you married, widowed, divorced, separated, never married, or living with a partner?: Separated   Intimate Partner Violence: Not At Risk (11/04/2023)    Humiliation, Afraid, Rape, and Kick questionnaire     Fear of Current or Ex-Partner: No     Emotionally Abused: No     Physically Abused: No     Sexually Abused: No   Housing Stability: Not At Risk (11/04/2023)    Housing Stability NCSS     Do you have housing?: Yes     Are you worried about losing your housing?: No   [5]   Allergies  Allergen Reactions    Strawberry C [Ascorbate] Hives    Strawberry Extract Hives    Lidocaine       Other Reaction(s): Headache/ Heart Palpitations

## 2024-01-13 NOTE — Anesthesia Postprocedure Evaluation (Signed)
 Anesthesia Post Evaluation    Patient: Scott Monroe.    CYSTOSCOPY, TRANSURETHAL RESECTION, BLADDER NEOPLASM, GEMCITABINE , TURP (Pelvis)    Anesthesia type: general    Last Vitals:   Vitals Value Taken Time   BP 153/92 01/13/24 15:40   Temp 36.4 C (97.6 F) 01/13/24 12:57   Pulse 122 01/13/24 15:40   Resp 14 01/13/24 15:40   SpO2 97 % 01/13/24 15:40                 Anesthesia Post Evaluation:     Patient Evaluated: PACU  Patient Participation: complete - patient participated  Level of Consciousness: awake  Pain Score: 2  Pain Management: adequate  Multimodal analgesia pain management approach  Strategies: acetaminophen  and local anesthesia  Airway Patency: patent  Two or more mitigation strategies used for obstructive sleep apnea.  Strategies: multimodal analgesia, awake extubation and extubation/recovery carried out in nonsupine position    Anesthetic complications: No      PONV Status: controlled    Cardiovascular status: acceptable  Respiratory status: acceptable  Hydration status: acceptable          Signed by: Raenelle JONETTA Miyamoto, MD, 01/13/2024 4:01 PM

## 2024-01-13 NOTE — Anesthesia Preprocedure Evaluation (Signed)
 Anesthesia Evaluation    AIRWAY          Planned to use difficult airway equipment: No CARDIOVASCULAR    cardiovascular exam normal       DENTAL    no notable dental hx               PULMONARY    pulmonary exam normal     OTHER FINDINGS                                        Relevant Problems   ANESTHESIA   (+) OSA (obstructive sleep apnea)      PULMONARY   (+) OSA (obstructive sleep apnea)      CARDIO   (+) Atrial fibrillation (CMS/HCC)   (+) Coronary artery disease involving native coronary artery of native heart without angina pectoris   (+) PAD (peripheral artery disease)   (+) Primary hypertension      GU/RENAL   (+) Stage 3b chronic kidney disease (CMS/HCC)      ENDO   (+) Type 2 diabetes mellitus without complication, without long-term current use of insulin  (CMS/HCC)               Anesthesia Plan    ASA 3     general                                 informed consent obtained                     Signed by: Raenelle JONETTA Miyamoto, MD 01/13/2024 10:06 AM

## 2024-01-13 NOTE — Op Note (Unsigned)
 Procedure Date: 01/13/2024    Patient Type: V    SURGEON: Hosey FALCON. Ina, MD    ATTENDING SURGEON:  Dr. Hosey Ina.    ASSISTANT:  Dr. Pouya Javadian.    PREOPERATIVE DIAGNOSES:  1.  Acute urinary retention.  2.  Gross hematuria.  3.  Bladder wall thickening.    POSTOPERATIVE DIAGNOSES:  1.  Acute urinary retention.  2.  Gross hematuria.  3.  Bladder wall thickening.  4.  Extensive bladder mass.    TITLE OF PROCEDURES:  1.  Cystourethroscopy.  2.  Transurethral resection of bladder tumor greater than 5 cm.  3.  Installation of gemcitabine .  4.  Transurethral resection of prostate.    ANESTHESIA:  General.    INTRAVENOUS FLUIDS:  Crystalloid.    ESTIMATED BLOOD LOSS:  Minimal.    SPECIMEN:  Bladder tumor to pathology.    DRAINS:  22-French 30 mL three-way Foley catheter to gravity.    COMPLICATIONS:  None.    INDICATIONS FOR PROCEDURE:  [name] is a 78 year old male who presented in consultation with a   recent history of acute urinary retention, gross hematuria, and concern for  malignancy with bladder wall thickening on CT imaging.  [name] states   that he has smoked cigarettes since he was 78 years old, and was previously  smoking well over 1 pack per day.  He is currently under 1 pack per day,   and his hematuria has resolved.  Due to the imaging findings and clinical   picture, the recommendation was made to proceed with cystourethroscopy,   TURBT, and installation of gemcitabine .  The risks, details, benefits were   discussed, all questions answered.  A conscious decision was made to   proceed.    DESCRIPTION OF PROCEDURE:  After the proper operative and anesthesia consents were obtained, Mr.   Scott Monroe was taken to the operating room to undergo the above procedures.    The patient was placed on the operating table in supine position.  SCDs   were placed on both lower extremities for DVT prophylaxis.  The patient was  given 2 grams of Ancef  perioperatively.  General anesthesia was induced, ET  tube was  placed.  The patient was placed in dorsal lithotomy position with   all pressure points padded.  The genitalia were prepped and draped in   standard sterile fashion.  A surgical pause was performed.  Once the team   was in agreement, a 22-French 30-degree rigid cystoscope was passed in the   bladder without difficulty.  The anterior urethra was normal.  The   prostatic urethra showed mild lateral lobar hyperplasia with what appeared   to be anterior protrusion of the median lobe.  Upon entering the bladder,   there was slight clot burden at the bladder base, which was evacuated with   an Ellik evacuator.  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  The tumor did extend up onto the left lateral   wall with extension towards the bladder neck, and what appeared to be   protrusion into the posterior prostate.  There was also some mild tumor   burden at the right anterior bladder.  In total, the tumor burden was well   over 6 to 7 cm.  At this point, the cystoscope was exchanged for the   26-French continuous flow resectoscope with a direct  visualization   obturator.  This was passed in the bladder without difficulty, and the   direct visualization obturator was exchanged for a monopolar resection loop  with a glycine  irrigant.  Attention was first turned to the tumor at the   left lateral wall, which was resected into muscularis propria.  The tumor   burden was then followed across the trigone, with no clear visualization of  either ureteral orifice.  However, due to bleeding from the tumor, the   resection continued.  This was resected deep into muscularis propria, with   no visualization of a ureteral orifice.  All tumor burden as described was   resected deep, and ultimately it did appear that the tumor burden had   pushed into the posterior median lobe of the prostate, and a TURP was   performed for further  diagnostics.  Once everything visible had been   resected, it was evacuated with an Ellik evacuator and sent to pathology as  bladder tumor.  Extensive cautery was performed for hemostasis and healing.   Once this had been completed, a repeat cystoscopy showed no evidence of   bladder perforation, no active bleeding, and no obvious other suspicious   lesions.  At this point, [name] bladder was left full and the scope   removed.  A 22-French 30 mL three-way Foley catheter was placed, with a   return of clear to light pink urine.  The balloon was inflated to 30 mL of   sterile water , and the bladder was allowed to drain completely.  At this   point, 2000 mg of gemcitabine  in 100 mL of sterile normal saline was   instilled through the Foley catheter and clamped.  This will remain in the   bladder for 1 hour.  We will plan to drain the bladder after 1 hour, and   depending on level of hematuria, initiate mild continuous bladder   irrigation.  We will reassess the patient with laboratory study to evaluate  kidney function, as well as a CT urogram to evaluate for ureteral   obstruction.  The procedure was completed without complication, surgical   counts correct at the end of the case, [name] tolerated the procedure   well.  The patient was brought out of anesthesia and taken to the recovery   room in stable and satisfactory condition.  [name] will be admitted to   the urology service for further postoperative care.      D: 01/13/2024 07:15 PM by Hosey FALCON. Ina, MD ([id number])  T: 01/13/2024 08:39 PM by [id number] (Conf: [id number]) (Doc ID:   [id number])

## 2024-01-13 NOTE — Brief Op Note (Signed)
 BRIEF OP NOTE    Date Time: 01/13/2024 12:41 PM    Patient Name:   Scott Monroe, Scott Monroe (MRN: [id number])    Date of Operation:   01/13/2024     Providers Performing:   Surgeons and Role:     DEWAINE Ina Hosey JULIANNA, MD - Primary     DEWAINE Herby Kurk, MD    Surgical First Assistant(s):   None    Operative Procedure:   CYSTOSCOPY, TURBT > 5CM, INSTILLATION OF GEMCITABINE , TURP      Preoperative Diagnosis:   Pre-Op Diagnosis Codes:      * Bladder mass [N32.89]    Postoperative Diagnosis:   Post-Op Diagnosis Codes:     * Bladder mass [N32.89]    Findings:   As dictated    Anesthesia:   general    Estimated Blood Loss:    * No values recorded between 01/13/2024 10:51 AM and 01/13/2024 12:41 PM *    Implants:   * No implants in log *        Drains:   Drains: 22 30cc 3 way foley            Specimens:     ID Type Source Tests Collected by Time Destination   1 : Bladder tumor Tissue Urinary bladder SURGICAL PATHOLOGY Ina Hosey JULIANNA, MD 01/13/2024 1131           Complications:   None     Signed by: Hosey JULIANNA Ina, MD                                                                           [place] [facility]

## 2024-01-14 ENCOUNTER — Observation Stay

## 2024-01-14 ENCOUNTER — Encounter: Payer: Self-pay | Admitting: Urology

## 2024-01-14 DIAGNOSIS — Z48816 Encounter for surgical aftercare following surgery on the genitourinary system: Secondary | ICD-10-CM

## 2024-01-14 LAB — LAB USE ONLY - CBC WITH DIFFERENTIAL
Absolute Basophils: 0.06 x10 3/uL (ref 0.00–0.08)
Absolute Eosinophils: 0.15 x10 3/uL (ref 0.00–0.44)
Absolute Immature Granulocytes: 0.02 x10 3/uL (ref 0.00–0.07)
Absolute Lymphocytes: 2.08 x10 3/uL (ref 0.42–3.22)
Absolute Monocytes: 0.79 x10 3/uL (ref 0.21–0.85)
Absolute Neutrophils: 5.6 x10 3/uL (ref 1.10–6.33)
Absolute nRBC: 0 x10 3/uL (ref <=0.00)
Basophils %: 0.7 %
Eosinophils %: 1.7 %
Hematocrit: 33 % — ABNORMAL LOW (ref 37.6–49.6)
Hemoglobin: 10.6 g/dL — ABNORMAL LOW (ref 12.5–17.1)
Immature Granulocytes %: 0.2 %
Lymphocytes %: 23.9 %
MCH: 27.7 pg (ref 25.1–33.5)
MCHC: 32.1 g/dL (ref 31.5–35.8)
MCV: 86.4 fL (ref 78.0–96.0)
MPV: 9.3 fL (ref 8.9–12.5)
Monocytes %: 9.1 %
Neutrophils %: 64.4 %
Platelet Count: 194 x10 3/uL (ref 142–346)
Preliminary Absolute Neutrophil Count: 5.6 x10 3/uL (ref 1.10–6.33)
RBC: 3.82 x10 6/uL — ABNORMAL LOW (ref 4.20–5.90)
RDW: 16 % — ABNORMAL HIGH (ref 11–15)
WBC: 8.7 x10 3/uL (ref 3.10–9.50)
nRBC %: 0 /100{WBCs} (ref <=0.0)

## 2024-01-14 LAB — BASIC METABOLIC PANEL
Anion Gap: 9 (ref 5.0–15.0)
BUN: 27 mg/dL (ref 9–28)
CO2: 23 meq/L (ref 17–29)
Calcium: 8.4 mg/dL (ref 7.9–10.2)
Chloride: 105 meq/L (ref 99–111)
Creatinine: 1.8 mg/dL — ABNORMAL HIGH (ref 0.5–1.5)
GFR: 38.5 mL/min/1.73 m2 — ABNORMAL LOW (ref >=60.0)
Glucose: 195 mg/dL — ABNORMAL HIGH (ref 70–100)
Potassium: 5 meq/L (ref 3.5–5.3)
Sodium: 137 meq/L (ref 135–145)

## 2024-01-14 LAB — WHOLE BLOOD GLUCOSE POCT
Whole Blood Glucose POCT: 102 mg/dL — ABNORMAL HIGH (ref 70–100)
Whole Blood Glucose POCT: 246 mg/dL — ABNORMAL HIGH (ref 70–100)

## 2024-01-14 MED ORDER — GLUCAGON 1 MG IJ SOLR (WRAP): 1.0000 mg | INTRAMUSCULAR | Status: DC | PRN

## 2024-01-14 MED ORDER — DEXTROSE 50 % IV SOLN: 12.5000 g | INTRAVENOUS | Status: DC | PRN

## 2024-01-14 MED ORDER — GABAPENTIN 300 MG PO CAPS
300.0000 mg | ORAL_CAPSULE | Freq: Three times a day (TID) | ORAL | Status: DC
  Administered 2024-01-14 – 2024-01-15 (×4): 300 mg via ORAL
  Filled 2024-01-14 (×4): qty 1

## 2024-01-14 MED ORDER — DEXTROSE 10 % IV BOLUS: 12.5000 g | INTRAVENOUS | Status: DC | PRN

## 2024-01-14 MED ORDER — GLUCOSE 40 % PO GEL (WRAP): 15.0000 g | ORAL | Status: DC | PRN

## 2024-01-14 MED ORDER — INSULIN LISPRO 100 UNIT/ML SOLN (WRAP)
1.0000 [IU] | Freq: Three times a day (TID) | Status: DC
  Administered 2024-01-14: 3 [IU] via SUBCUTANEOUS
  Administered 2024-01-14 – 2024-01-16 (×4): 2 [IU] via SUBCUTANEOUS
  Filled 2024-01-14: qty 3
  Filled 2024-01-14 (×4): qty 6

## 2024-01-14 NOTE — Consults (Signed)
 Interventional Radiology  CONSULT HISTORY AND PHYSICAL EXAM    Date Time: 01/14/2024 5:15 PM  Patient Name: Scott Monroe, Scott Monroe.  Attending Physician: Ina Hosey FALCON, MD    History of Presenting Illness:   [place] Want. is a 78 y.o. male recently found to have large bladder mass s/p cystoscopy, TURBT, instillation of chemo, TURP 12/29, bilateral hydro s/p failed bilateral ureteral stent placement by urology, IR consulted for bilateral PCN placement.     Assessment   Mr. Troy is a 78 y.o. male with large bladder mass with b/l hydro s/p failed stent placement by urology, consulted for b/l PCN placement.     -AVSS  -Cr 1.8 (1.7)  -H/H 10.6/33  -No contrast allergy    Plan:   -Case discussed with IR attending Dr.Cooper, plan for bilateral PCN placement tomorrow 12/31  - Discussion was held with the patient regarding patient's diagnosis and procedural treatment options.  This discussion included risks, benefits and alternative treatment options. Following the discussion, all of patients questions have been answered and patient wishes to proceed with the recommended treatment.   -Please keep patient NPO at MN. Hold any DVT prophylaxis.     Past Medical History:   Medical History[1]    Past Surgical History:   Past Surgical History[2]    Family History:   Family History[3]    Social History:   Social History[4]    Allergies:   Allergies[5]    Medications:     Medications Prior to Admission   Medication Sig    amLODIPine  (NORVASC ) 5 MG tablet Take 1 tablet (5 mg) by mouth once daily Pt ran out of this medication    atorvastatin  (LIPITOR) 80 MG tablet Take 1 tablet (80 mg) by mouth nightly    Continuous Glucose Receiver (FreeStyle Libre 3 Reader) Device Use 4 times daily - with meals and at bedtime    Continuous Glucose Sensor (FreeStyle Libre 3 Sensor) Misc Use 1 Application every 14 (fourteen) days    metFORMIN  (GLUCOPHAGE ) 1000 MG tablet Take 1 tablet (1,000 mg) by mouth 2 (two) times daily with meals     metoprolol  tartrate (LOPRESSOR ) 50 MG tablet Take 1 tablet (50 mg) by mouth 2 (two) times daily Hold for blood pressure lower than 100/60    apixaban  (ELIQUIS ) 2.5 MG tablet Take 1 tablet (2.5 mg) by mouth every 12 (twelve) hours    aspirin  EC 81 MG EC tablet Take 1 tablet (81 mg) by mouth once daily    gabapentin  (NEURONTIN ) 300 MG capsule Take 2 capsules (600 mg) by mouth 3 (three) times daily       Review of Systems:   A comprehensive review of systems was: negative except as stated in HPI    Physical Exam:     Vitals:    01/14/24 1603   BP: 148/76   Pulse: 98   Resp: 18   Temp: 97.5 F (36.4 C)   SpO2: 98%       GEN: NAD  LUNGS: normal respirations  COR: Regular rate  ABDOMEN: Soft, NT/ND    Labs:     Recent Labs   Lab 01/14/24  0428 01/13/24  1356   WBC 8.70 10.22*   RBC 3.82* 4.40   Hemoglobin 10.6* 12.2*   Hematocrit 33.0* 37.8     Recent Labs   Lab 01/14/24  0428 01/13/24  1356   Glucose 195* 210*   BUN 27 23   Creatinine 1.8* 1.4   Sodium  137 136   Chloride 105 104   CO2 23 26     Recent Labs   Lab 01/13/24  1356   INR 0.9         Rads:   No results found.    Radiological Procedure reviewed.      Camie Argyle PA-C    [facility]  [facility], Division of Vascular & Interventional Radiology  [facility]: [phone] (inpatient)  [facility]: [phone] (outpatient)  [facility]:  [phone]  [facility]: [phone]  [facility]: [phone]  [facility]: [phone]    [facility] Office: [phone]  After hours/Answering service:  [phone]         [1]   Past Medical History:  Diagnosis Date    Hyperlipidemia     Hypertension     Insomnia     Irregular heart beat     Restless leg     Sleep apnea    [2]   Past Surgical History:  Procedure Laterality Date    ARTERIAL-  LOWER EXTREMITY ANGIOGRAPHY POSS PTA Right 11/09/2023    Procedure: Arterial-  Lower Extremity Angiography Poss PTA;  Surgeon: Marinus Longest, MD;  Location: [facility];  Service: Cardiovascular;  Laterality: Right;    ARTERIAL-  LOWER EXTREMITY ANGIOGRAPHY  POSS PTA N/A 11/11/2023    Procedure: Arterial-  Lower Extremity Angiography Poss PTA;  Surgeon: Marinus Longest, MD;  Location: [facility];  Service: Cardiovascular;  Laterality: N/A;    BACK SURGERY  2016    CYSTOSCOPY, TRANSURETHAL RESECTION, BLADDER TUMOR N/A 01/13/2024    Procedure: CYSTOSCOPY, TRANSURETHAL RESECTION, BLADDER NEOPLASM, GEMCITABINE , TURP;  Surgeon: Ina Hosey FALCON, MD;  Location: [facility];  Service: Urology;  Laterality: N/A;    FEMORAL-POPLITEAL BYPASS Right 11/12/2023    Procedure: CREATION, BYPASS, ARTERIAL, FEMORAL TO POSTERIOR POPLITEAL; REVERSE SAPHENOUS VEIN BYPASS;  Surgeon: Marinus Longest, MD;  Location:  [facility];  Service: Vascular;  Laterality: Right;  10/28-CARD SENT    SPINAL CORD STIMULATOR IMPLANT  2016   [3]   Family History  Problem Relation Name Age of Onset    Diabetes Mother      Liver cancer Mother      Cirrhosis Father      Diabetes Father      Brain cancer Sister      Heart attack Sister      Kidney failure Brother     [4]   Social History  Socioeconomic History    Marital status: Divorced   Tobacco Use    Smoking status: Every Day     Current packs/day: 1.00     Average packs/day: 1 pack/day for 64.0 years (64.0 ttl pk-yrs)     Types: Cigarettes     Start date: 1962    Smokeless tobacco: Never   Vaping Use    Vaping status: Never Used   Substance and Sexual Activity    Alcohol use: Never    Drug use: Never     Social Drivers of Psychologist, [employer] Strain: High Risk (11/04/2023)    Overall Financial Resource Strain (CARDIA)     Difficulty of Paying Living Expenses: Hard   Food Insecurity: No Food Insecurity (01/13/2024)    Hunger Vital Sign     Worried About Running Out of Food in the Last Year: Never true     Ran Out of Food in the Last Year: Never true   Transportation Needs: No Transportation Needs (01/13/2024)    PRAPARE - Transportation  Lack of Transportation (Medical): No     Lack of Transportation (Non-Medical): No    Physical Activity: Sufficiently Active (12/31/2023)    Exercise Vital Sign     Days of Exercise per Week: 7 days     Minutes of Exercise per Session: 60 min   Stress: No Stress Concern Present (01/15/2022)    Received from [facility]    [facility] - Occupational Stress Questionnaire     Feeling of Stress : Only a little   Social Connections: Socially Isolated (01/15/2022)    Received from [facility]    Social Connection and Isolation Panel     In a typical week, how many times do you talk on the phone with family, friends, or neighbors?: More than three times a week     How often do you get together with friends or relatives?: Never     How often do you attend church or religious services?: Never     Do you belong to any clubs or organizations such as church groups, unions, fraternal or athletic groups, or school groups?: No     How often do you attend meetings of the clubs or organizations you belong to?: Never     Are you married, widowed, divorced, separated, never married, or living with a partner?: Separated   Intimate Partner Violence: Not At Risk (01/13/2024)    Humiliation, Afraid, Rape, and Kick questionnaire     Fear of Current or Ex-Partner: No     Emotionally Abused: No     Physically Abused: No     Sexually Abused: No   Housing Stability: Not At Risk (01/13/2024)    Housing Stability NCSS     Do you have housing?: Yes     Are you worried about losing your housing?: No   [5]   Allergies  Allergen Reactions    Strawberry C [Ascorbate] Hives    Strawberry Extract Hives    Lidocaine       Other Reaction(s): Headache/ Heart Palpitations

## 2024-01-14 NOTE — Plan of Care (Signed)
 Problem: Moderate/High Fall Risk Score >5  Goal: Patient will remain free of falls  Outcome: Progressing  Flowsheets (Taken 01/14/2024 2200)  Moderate Risk (6-13):   MOD-Consider activation of bed alarm if appropriate   MOD-Apply bed exit alarm if patient is confused   MOD-Remain with patient during toileting   MOD-Perform dangle, stand, walk (DSW) prior to mobilization   MOD-include family in multidisciplinary POC discussions     Problem: Pain interferes with ability to perform ADL  Goal: Pain at adequate level as identified by patient  Outcome: Progressing  Flowsheets (Taken 01/14/2024 2231)  Pain at adequate level as identified by patient:   Identify patient comfort function goal   Assess for risk of opioid induced respiratory depression, including snoring/sleep apnea. Alert healthcare team of risk factors identified.   Assess pain on admission, during daily assessment and/or before any as needed intervention(s)   Reassess pain within 30-60 minutes of any procedure/intervention, per Pain Assessment, Intervention, Reassessment (AIR) Cycle   Evaluate if patient comfort function goal is met   Offer non-pharmacological pain management interventions   Include patient/patient care companion in decisions related to pain management as needed   Evaluate patient's satisfaction with pain management progress     Problem: Side Effects from Pain Analgesia  Goal: Patient will experience minimal side effects of analgesic therapy  Outcome: Progressing  Flowsheets (Taken 01/14/2024 2231)  Patient will experience minimal side effects of analgesic therapy:   Monitor/assess patient's respiratory status (RR depth, effort, breath sounds)   Assess for changes in cognitive function   Prevent/manage side effects per LIP orders (i.e. nausea, vomiting, pruritus, constipation, urinary retention, etc.)   Evaluate for opioid-induced sedation with appropriate assessment tool (i.e. POSS)     Problem: Bladder/Voiding  Goal: Patient will  experience proper bladder emptying during admission and remain free from infection  Outcome: Progressing  Flowsheets (Taken 01/14/2024 2231)  Patient will experience proper bladder emptying during admission and remain free from infection:   Apply urinary containment device as appropriate and/or per order   Encourage bladder emptying at regular intervals   Assess need for indwelling catheter every shift and discuss with LIP     Problem: Inadequate Airway Clearance  Goal: Patent Airway maintained  Outcome: Progressing  Flowsheets (Taken 01/14/2024 2231)  Patent airway maintained:   Reinforce use of ordered respiratory interventions (i.e. CPAP, BiPAP, Incentive Spirometer, Acapella, etc.)   Reposition patient every 2 hours and as needed unless able to self-reposition  Goal: Normal respiratory rate/effort achieved/maintained  Outcome: Progressing  Flowsheets (Taken 01/14/2024 2231)  Normal respiratory rate/effort achieved/maintained: Plan activities to conserve energy: plan rest periods     Problem: Infection Prevention  Goal: Free from infection  Outcome: Progressing  Flowsheets (Taken 01/14/2024 2231)  Free from infection:   Monitor/assess vital signs   Assess incision for evidence of healing   Assess for signs and symptoms of infection   Monitor/assess lab values and report abnormal values   Encourage/assist patient to turn, cough and perform deep breathing every 2 hours     Problem: Impaired Mobility  Goal: Mobility/Activity is maintained at optimal level for patient  Outcome: Progressing  Flowsheets (Taken 01/14/2024 2231)  Mobility/activity is maintained at optimal level for patient: Encourage independent activity per ability     Problem: Constipation  Goal: Fluid and electrolyte balance are achieved/maintained  Outcome: Progressing  Flowsheets (Taken 01/14/2024 2231)  Fluid and electrolyte balance are achieved/maintained:   Monitor/assess lab values and report abnormal values   Monitor  for muscle weakness  Goal:  Elimination patterns are normal or improving  Outcome: Progressing  Flowsheets (Taken 01/14/2024 2231)  Elimination patterns are normal or improving: Assess for and discuss C. diff screening with LIP  Goal: Mobility/Activity is maintained at optimal level for patient  Outcome: Progressing  Flowsheets (Taken 01/14/2024 2231)  Mobility/activity is maintained at optimal level for patient: Encourage independent activity per ability     Problem: Inadequate Gas Exchange  Goal: Patent Airway maintained  Outcome: Progressing  Flowsheets (Taken 01/14/2024 2231)  Patent airway maintained:   Reinforce use of ordered respiratory interventions (i.e. CPAP, BiPAP, Incentive Spirometer, Acapella, etc.)   Reposition patient every 2 hours and as needed unless able to self-reposition  Goal: Adequate oxygenation and improved ventilation  Outcome: Progressing  Flowsheets (Taken 01/14/2024 2231)  Adequate oxygenation and improved ventilation:   Assess lung sounds   Monitor SpO2 and treat as needed   Teach/reinforce use of incentive spirometer 10 times per hour while awake, cough and deep breath as needed   Plan activities to conserve energy: plan rest periods   Increase activity as tolerated/progressive mobility     Problem: Artificial Airway  Goal: Endotracheal tube will be maintained  Outcome: Progressing  Flowsheets (Taken 01/14/2024 2231)  Endotracheal  tube will be maintained:   Keep head of bed at 30 degrees, unless contraindicated   Encourage/perform oral hygiene as appropriate  Goal: 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  Outcome: Progressing  Flowsheets (Taken 01/14/2024 2231)  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:   Keep head of bed at 30 degrees, unless contraindicated   Encourage/perform oral hygiene as appropriate     Problem: Compromised Sensory Perception  Goal: Sensory Perception Interventions  Outcome: Progressing  Flowsheets (Taken 01/14/2024 2231)  Sensory Perception Interventions: Offload heels, Pad bony prominences, Reposition  q 2hrs/turn Clock, Q2 hour skin assessment under devices if present     Problem: Compromised Moisture  Goal: Moisture level Interventions  Outcome: Progressing  Flowsheets (Taken 01/14/2024 2231)  Moisture level Interventions: Moisture wicking products, Moisture barrier cream     Problem: Compromised Activity/Mobility  Goal: Activity/Mobility Interventions  Outcome: Progressing  Flowsheets (Taken 01/14/2024 2231)  Activity/Mobility Interventions: Pad bony prominences, TAP Seated positioning system when OOB, Promote PMP, Reposition q 2 hrs / turn clock, Offload heels     Problem: Compromised Friction/Shear  Goal: Friction and Shear Interventions  Outcome: Progressing  Flowsheets (Taken 01/14/2024 2231)  Friction and Shear Interventions: Pad bony prominences, Off load heels, HOB 30 degrees or less unless contraindicated, Consider: TAP seated positioning, Heel foams

## 2024-01-14 NOTE — Nursing Progress Note (Signed)
 Patient transported to unit via bed with patient transport.

## 2024-01-14 NOTE — Nursing Progress Note (Signed)
 Report called to RN on CCW. Waiting for patient transport.

## 2024-01-14 NOTE — Final Progress Note (DC Note for stay less than 48 (Signed)
 FINAL PROGRESS NOTE    Date Time: 01/14/2024 9:17 AM  Patient Name: Scott Monroe, Scott Monroe.      Assessment:   78 y.o. M s/p cysto, TURBT, TURP and instillation of gemcitabine  POD #1.     -Renal: Creatinine 1.8, UOP 4100 ml  -ID: WBC 8.70, AF  -Heme: H/H 10.6/33.0     Plan:   CT C/A/P today for staging and assess for hydro   NPO   Clamp CBI   Maintain foley catheter   [place] home today vs tomorrow depending on CT results     The assessment and plan were discussed with Hosey Baptist, MD and formulated together as documented.      MDM:  The above Assessment and plan were formulated by myself and Dima Zaza, NP, and the above has been reviewed and any changes or caveats are dictated below.       Additional Diagnoses:                      Subjective:   No pain or complaints     Medications:   Current Facility-Administered Medications[1]        Physical Exam:     Vitals:    01/14/24 0914   BP: 99/56   Pulse:    Resp:    Temp:    SpO2:        Intake and Output Summary (Last 24 hours) at Date Time    Intake/Output Summary (Last 24 hours) at 01/14/2024 0917  Last data filed at 01/14/2024 0517  Gross per 24 hour   Intake 1320 ml   Output 4115 ml   Net -2795 ml     Physical Exam:   Constitutional: Vital signs reviewed. Male.   Head: Normocephalic, atraumatic.   ENT: Oropharynx clear. Moist mucus membranes.  Neck: Supple. Normal range of motion. Trachea midline.   Respiratory/Chest:  No respiratory distress. Nl effort.   Cardiovascular: Regular rate. Intact peripheral pulses.   GU: Catheter draining clear straw urine on slow CBI   Skin: Warm and dry.   Psychiatric: Awake and alert. Normal affect. Good insight. Good historian.      Labs:     Results for orders placed or performed during the hospital encounter of 01/13/24 (from the past 24 hours)    Collection Time: 01/13/24  9:38 AM   Result Value    Whole Blood Glucose POCT 144 (H)    Collection Time: 01/13/24 12:57 PM   Result Value    Whole Blood Glucose POCT 199 (H)   Basic  Metabolic Panel    Collection Time: 01/13/24  1:56 PM   Result Value    Glucose 210 (H)    BUN 23    Creatinine 1.4    Calcium  8.9    Sodium 136    Potassium 5.1    Chloride 104    CO2 26    Anion Gap 6.0    GFR 52.1 (L)   CBC without Differential    Collection Time: 01/13/24  1:56 PM   Result Value    WBC 10.22 (H)    Hemoglobin 12.2 (L)    Hematocrit 37.8    Platelet Count 212    MPV 9.4    RBC 4.40    MCV 85.9    MCH 27.7    MCHC 32.3    RDW 16 (H)    nRBC % 0.0    Absolute nRBC 0.00   PT/INR  Collection Time: 01/13/24  1:56 PM   Result Value    PT 11.0    INR 0.9   Basic Metabolic Panel    Collection Time: 01/14/24  4:28 AM   Result Value    Glucose 195 (H)    BUN 27    Creatinine 1.8 (H)    Calcium  8.4    Sodium 137    Potassium 5.0    Chloride 105    CO2 23    Anion Gap 9.0    GFR 38.5 (L)   CBC with Differential (Component)    Collection Time: 01/14/24  4:28 AM   Result Value    WBC 8.70    Hemoglobin 10.6 (L)    Hematocrit 33.0 (L)    Platelet Count 194    MPV 9.3    RBC 3.82 (L)    MCV 86.4    MCH 27.7    MCHC 32.1    RDW 16 (H)    nRBC % 0.0    Absolute nRBC 0.00    Preliminary Absolute Neutrophil Count 5.60    Neutrophils % 64.4    Lymphocytes % 23.9    Monocytes % 9.1    Eosinophils % 1.7    Basophils % 0.7    Immature Granulocytes % 0.2    Absolute Neutrophils 5.60    Absolute Lymphocytes 2.08    Absolute Monocytes 0.79    Absolute Eosinophils 0.15    Absolute Basophils 0.06    Absolute Immature Granulocytes 0.02         Rads:   Radiological Procedure reviewed.    Signed by: Dima Zaza, NP  Dima Zaza, NP   Spectra  [phone]  Urology Spectra  [phone]   IF unreachable/after hours call [phone]             [1]   Current Facility-Administered Medications   Medication Dose Route Frequency    amLODIPine   5 mg Oral Daily    atorvastatin   80 mg Oral QHS    gabapentin   300 mg Oral TID    insulin  lispro  1-5 Units Subcutaneous TID AC    metoprolol  tartrate  50 mg Oral BID

## 2024-01-14 NOTE — Plan of Care (Addendum)
 - complaining chronic back pain, refused PRN meds  - denies SOB or CP  - A.fib on the monitor HR 90-100's  - CBI mgt  - NPO midnight  - AC/ HS  Problem: Moderate/High Fall Risk Score >5  Goal: Patient will remain free of falls  Flowsheets (Taken 01/13/2024 2000)  High (Greater than 13):   HIGH-Visual cue at entrance to patient's room   HIGH-Utilize chair pad alarm for patient while in the chair   HIGH-Bed alarm on at all times while patient in bed   HIGH-Apply yellow Fall Risk arm band   HIGH-Pharmacy to initiate evaluation and intervention per protocol   HIGH-Initiate use of floor mats as appropriate   HIGH-Consider use of low bed     Problem: Pain interferes with ability to perform ADL  Goal: Pain at adequate level as identified by patient  Flowsheets (Taken 01/14/2024 0400)  Pain at adequate level as identified by patient:   Identify patient comfort function goal   Assess for risk of opioid induced respiratory depression, including snoring/sleep apnea. Alert healthcare team of risk factors identified.   Assess pain on admission, during daily assessment and/or before any as needed intervention(s)   Reassess pain within 30-60 minutes of any procedure/intervention, per Pain Assessment, Intervention, Reassessment (AIR) Cycle   Evaluate if patient comfort function goal is met   Evaluate patient's satisfaction with pain management progress   Offer non-pharmacological pain management interventions   Consult/collaborate with Pain Service   Consult/collaborate with Physical Therapy, Occupational Therapy, and/or Speech Therapy   Include patient/patient care companion in decisions related to pain management as needed     Problem: Side Effects from Pain Analgesia  Goal: Patient will experience minimal side effects of analgesic therapy  Flowsheets (Taken 01/14/2024 0400)  Patient will experience minimal side effects of analgesic therapy:   Monitor/assess patient's respiratory status (RR depth, effort, breath sounds)    Assess for changes in cognitive function   Prevent/manage side effects per LIP orders (i.e. nausea, vomiting, pruritus, constipation, urinary retention, etc.)   Evaluate for opioid-induced sedation with appropriate assessment tool (i.e. POSS)     Problem: Inadequate Airway Clearance  Goal: Patent Airway maintained  Flowsheets (Taken 01/14/2024 0400)  Patent airway maintained:   Position patient for maximum ventilatory efficiency   Suction secretions as needed   Provide adequate fluid intake to liquefy secretions   Reinforce use of ordered respiratory interventions (i.e. CPAP, BiPAP, Incentive Spirometer, Acapella, etc.)  Goal: Normal respiratory rate/effort achieved/maintained  Flowsheets (Taken 01/14/2024 0400)  Normal respiratory rate/effort achieved/maintained: Plan activities to conserve energy: plan rest periods     Problem: Infection Prevention  Goal: Free from infection  Flowsheets (Taken 01/14/2024 0400)  Free from infection:   Monitor/assess vital signs   Encourage/assist patient to turn, cough and perform deep breathing every 2 hours   Assess incision for evidence of healing   Monitor/assess output from surgical drain if present   Assess for signs and symptoms of infection   Monitor/assess lab values and report abnormal values     Problem: Impaired Mobility  Goal: Mobility/Activity is maintained at optimal level for patient  Flowsheets (Taken 01/14/2024 0400)  Mobility/activity is maintained at optimal level for patient:   Encourage independent activity per ability   Consult/collaborate with Physical Therapy and/or Occupational Therapy     Problem: Constipation  Goal: Fluid and electrolyte balance are achieved/maintained  Flowsheets (Taken 01/14/2024 0400)  Fluid and electrolyte balance are achieved/maintained:   Monitor/assess lab values and  report abnormal values   Assess and reassess fluid and electrolyte status   Observe for cardiac arrhythmias   Monitor for muscle weakness  Goal: Nutritional intake is  adequate  Flowsheets (Taken 01/14/2024 0400)  Nutritional intake is adequate: Consult/collaborate with Clinical Nutritionist  Goal: Mobility/Activity is maintained at optimal level for patient  Flowsheets (Taken 01/14/2024 0400)  Mobility/activity is maintained at optimal level for patient:   Encourage independent activity per ability   Consult/collaborate with Physical Therapy and/or Occupational Therapy

## 2024-01-14 NOTE — Progress Notes (Signed)
 Patient off the unit to CT

## 2024-01-14 NOTE — Plan of Care (Signed)
 Problem: Moderate/High Fall Risk Score >5  Goal: Patient will remain free of falls  Outcome: Progressing  Flowsheets (Taken 01/14/2024 1349)  Moderate Risk (6-13): MOD-Floor mat at bedside (where available) if appropriate  High (Greater than 13): HIGH-Visual cue at entrance to patient's room  VH High Risk (Greater than 13):   ALL REQUIRED MODERATE INTERVENTIONS   A safety companion may be used when deemed appropriate by the Primary RN and Clinical Administrator     Problem: Pain interferes with ability to perform ADL  Goal: Pain at adequate level as identified by patient  Outcome: Progressing  Flowsheets (Taken 01/14/2024 1349)  Pain at adequate level as identified by patient:   Identify patient comfort function goal   Assess for risk of opioid induced respiratory depression, including snoring/sleep apnea. Alert healthcare team of risk factors identified.   Assess pain on admission, during daily assessment and/or before any as needed intervention(s)   Reassess pain within 30-60 minutes of any procedure/intervention, per Pain Assessment, Intervention, Reassessment (AIR) Cycle   Evaluate if patient comfort function goal is met   Evaluate patient's satisfaction with pain management progress   Offer non-pharmacological pain management interventions     Problem: Side Effects from Pain Analgesia  Goal: Patient will experience minimal side effects of analgesic therapy  Outcome: Progressing  Flowsheets (Taken 01/14/2024 1349)  Patient will experience minimal side effects of analgesic therapy:   Monitor/assess patient's respiratory status (RR depth, effort, breath sounds)   Prevent/manage side effects per LIP orders (i.e. nausea, vomiting, pruritus, constipation, urinary retention, etc.)     Problem: Bladder/Voiding  Goal: Patient will experience proper bladder emptying during admission and remain free from infection  Outcome: Progressing  Flowsheets (Taken 01/14/2024 1349)  Patient will experience proper bladder  emptying during admission and remain free from infection:   Apply urinary containment device as appropriate and/or per order   Utilize bladder scans prior to or post void as appropriate   Encourage patient to identify medications that aid bladder function prior to administration   Encourage bladder emptying at regular intervals     Problem: Inadequate Airway Clearance  Goal: Patent Airway maintained  Outcome: Progressing  Flowsheets (Taken 01/14/2024 1349)  Patent airway maintained:   Reinforce use of ordered respiratory interventions (i.e. CPAP, BiPAP, Incentive Spirometer, Acapella, etc.)   Reposition patient every 2 hours and as needed unless able to self-reposition  Goal: Normal respiratory rate/effort achieved/maintained  Outcome: Progressing     Problem: Infection Prevention  Goal: Free from infection  Outcome: Progressing  Flowsheets (Taken 01/14/2024 1349)  Free from infection:   Monitor/assess vital signs   Encourage/assist patient to turn, cough and perform deep breathing every 2 hours   Assess incision for evidence of healing   Monitor/assess output from surgical drain if present   Assess for signs and symptoms of infection     Problem: Impaired Mobility  Goal: Mobility/Activity is maintained at optimal level for patient  Outcome: Progressing  Flowsheets (Taken 01/14/2024 1349)  Mobility/activity is maintained at optimal level for patient: Encourage independent activity per ability     Problem: Constipation  Goal: Fluid and electrolyte balance are achieved/maintained  Outcome: Progressing  Flowsheets (Taken 01/14/2024 1349)  Fluid and electrolyte balance are achieved/maintained:   Monitor/assess lab values and report abnormal values   Assess and reassess fluid and electrolyte status   Observe for cardiac arrhythmias   Monitor for muscle weakness  Goal: Elimination patterns are normal or improving  Outcome: Progressing  Goal: Nutritional  intake is adequate  Outcome: Progressing  Goal: Mobility/Activity is  maintained at optimal level for patient  Outcome: Progressing  Flowsheets (Taken 01/14/2024 1349)  Mobility/activity is maintained at optimal level for patient: Encourage independent activity per ability     Problem: Inadequate Gas Exchange  Goal: Patent Airway maintained  Outcome: Progressing  Flowsheets (Taken 01/14/2024 1349)  Patent airway maintained:   Reinforce use of ordered respiratory interventions (i.e. CPAP, BiPAP, Incentive Spirometer, Acapella, etc.)   Reposition patient every 2 hours and as needed unless able to self-reposition  Goal: Adequate oxygenation and improved ventilation  Outcome: Progressing  Flowsheets (Taken 01/14/2024 1349)  Adequate oxygenation and improved ventilation:   Assess lung sounds   Teach/reinforce use of incentive spirometer 10 times per hour while awake, cough and deep breath as needed   Increase activity as tolerated/progressive mobility     Problem: Artificial Airway  Goal: Endotracheal tube will be maintained  Outcome: Progressing  Flowsheets (Taken 01/14/2024 1349)  Endotracheal  tube will be maintained: Encourage/perform oral hygiene as appropriate  Goal: 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  Outcome: Progressing  Flowsheets (Taken 01/14/2024 1349)  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: Encourage/perform oral hygiene as appropriate     Problem: Compromised Sensory Perception  Goal: Sensory Perception Interventions  Outcome: Progressing  Flowsheets (Taken 01/14/2024 1120)  Sensory Perception Interventions: Offload heels, Pad bony prominences, Reposition q 2hrs/turn Clock, Q2 hour skin assessment under devices if present     Problem: Compromised Moisture  Goal: Moisture level Interventions  Outcome: Progressing  Flowsheets (Taken 01/14/2024 1120)  Moisture level Interventions: Moisture wicking products, Moisture barrier cream     Problem: Compromised Activity/Mobility  Goal: Activity/Mobility Interventions  Outcome: Progressing  Flowsheets (Taken 01/14/2024 1120)  Activity/Mobility  Interventions: Pad bony prominences, TAP Seated positioning system when OOB, Promote PMP, Reposition q 2 hrs / turn clock, Offload heels     Problem: Compromised Friction/Shear  Goal: Friction and Shear Interventions  Outcome: Progressing  Flowsheets (Taken 01/14/2024 1120)  Friction and Shear Interventions: Pad bony prominences, Off load heels, HOB 30 degrees or less unless contraindicated, Consider: TAP seated positioning, Heel foams

## 2024-01-15 ENCOUNTER — Other Ambulatory Visit: Payer: Self-pay

## 2024-01-15 ENCOUNTER — Encounter
Admission: AD | Disposition: A | Discharge status: Home Health | Payer: Self-pay | Source: Home / Self Care | Attending: Urology

## 2024-01-15 HISTORY — PX: PERC NEPH TUBE PLACEMENT: IMG2617

## 2024-01-15 LAB — BASIC METABOLIC PANEL
Anion Gap: 5 (ref 5.0–15.0)
BUN: 30 mg/dL — ABNORMAL HIGH (ref 9–28)
CO2: 26 meq/L (ref 17–29)
Calcium: 8.6 mg/dL (ref 7.9–10.2)
Chloride: 104 meq/L (ref 99–111)
Creatinine: 1.5 mg/dL (ref 0.5–1.5)
GFR: 48 mL/min/1.73 m2 — ABNORMAL LOW (ref >=60.0)
Glucose: 138 mg/dL — ABNORMAL HIGH (ref 70–100)
Potassium: 4.6 meq/L (ref 3.5–5.3)
Sodium: 135 meq/L (ref 135–145)

## 2024-01-15 LAB — LAB USE ONLY - CBC WITH DIFFERENTIAL
Absolute Basophils: 0.06 x10 3/uL (ref 0.00–0.08)
Absolute Eosinophils: 0.25 x10 3/uL (ref 0.00–0.44)
Absolute Immature Granulocytes: 0.01 x10 3/uL (ref 0.00–0.07)
Absolute Lymphocytes: 2.14 x10 3/uL (ref 0.42–3.22)
Absolute Monocytes: 0.41 x10 3/uL (ref 0.21–0.85)
Absolute Neutrophils: 5.04 x10 3/uL (ref 1.10–6.33)
Absolute nRBC: 0 x10 3/uL (ref <=0.00)
Basophils %: 0.8 %
Eosinophils %: 3.2 %
Hematocrit: 30.8 % — ABNORMAL LOW (ref 37.6–49.6)
Hemoglobin: 9.9 g/dL — ABNORMAL LOW (ref 12.5–17.1)
Immature Granulocytes %: 0.1 %
Lymphocytes %: 27.1 %
MCH: 27.7 pg (ref 25.1–33.5)
MCHC: 32.1 g/dL (ref 31.5–35.8)
MCV: 86.3 fL (ref 78.0–96.0)
MPV: 9.3 fL (ref 8.9–12.5)
Monocytes %: 5.2 %
Neutrophils %: 63.6 %
Platelet Count: 179 x10 3/uL (ref 142–346)
Preliminary Absolute Neutrophil Count: 5.04 x10 3/uL (ref 1.10–6.33)
RBC: 3.57 x10 6/uL — ABNORMAL LOW (ref 4.20–5.90)
RDW: 16 % — ABNORMAL HIGH (ref 11–15)
WBC: 7.91 x10 3/uL (ref 3.10–9.50)
nRBC %: 0 /100{WBCs} (ref <=0.0)

## 2024-01-15 LAB — WHOLE BLOOD GLUCOSE POCT
Whole Blood Glucose POCT: 146 mg/dL — ABNORMAL HIGH (ref 70–100)
Whole Blood Glucose POCT: 167 mg/dL — ABNORMAL HIGH (ref 70–100)
Whole Blood Glucose POCT: 217 mg/dL — ABNORMAL HIGH (ref 70–100)

## 2024-01-15 MED ORDER — FENTANYL CITRATE (PF) 50 MCG/ML IJ SOLN (WRAP)
INTRAMUSCULAR | Status: AC
  Filled 2024-01-15: qty 2

## 2024-01-15 MED ORDER — IOHEXOL 300 MG/ML IJ SOLN
INTRAMUSCULAR | Status: AC | PRN
  Administered 2024-01-15: 27 mL via INTRAMUSCULAR

## 2024-01-15 MED ORDER — NORMAL SALINE FLUSH 0.9 % IV SOLN
INTRAVENOUS | 3 refills | Status: AC
  Filled 2024-01-15: qty 300, 30d supply, fill #0

## 2024-01-15 MED ORDER — CHLOROPROCAINE HCL (PF) 3 % IJ SOLN
30.0000 mL | Freq: Once | INTRAMUSCULAR | Status: AC
  Administered 2024-01-15: 30 mL
  Filled 2024-01-15: qty 40

## 2024-01-15 MED ORDER — FLUMAZENIL 0.5 MG/5ML IV SOLN
INTRAVENOUS | Status: AC
  Filled 2024-01-15: qty 5

## 2024-01-15 MED ORDER — NALOXONE HCL 0.4 MG/ML IJ SOLN (WRAP)
INTRAMUSCULAR | Status: AC
  Filled 2024-01-15: qty 1

## 2024-01-15 MED ORDER — GABAPENTIN 300 MG PO CAPS
300.0000 mg | ORAL_CAPSULE | Freq: Two times a day (BID) | ORAL | Status: DC
  Administered 2024-01-15 – 2024-01-17 (×4): 300 mg via ORAL
  Filled 2024-01-15 (×4): qty 1

## 2024-01-15 MED ORDER — MIDAZOLAM HCL 1 MG/ML IJ SOLN (WRAP)
INTRAMUSCULAR | Status: AC
  Filled 2024-01-15: qty 2

## 2024-01-15 MED ORDER — MIDAZOLAM HCL 1 MG/ML IJ SOLN (WRAP)
1.0000 mg | INTRAMUSCULAR | Status: AC | PRN
  Administered 2024-01-15 (×3): 1 mg via INTRAVENOUS
  Administered 2024-01-15 (×2): 0.5 mg via INTRAVENOUS

## 2024-01-15 MED ORDER — SODIUM CHLORIDE 0.9 % IV MBP
4.5000 g | Freq: Once | INTRAVENOUS | Status: AC
  Administered 2024-01-15: 4.5 g via INTRAVENOUS
  Filled 2024-01-15: qty 22.2

## 2024-01-15 MED ORDER — FENTANYL CITRATE (PF) 50 MCG/ML IJ SOLN (WRAP)
50.0000 ug | INTRAMUSCULAR | Status: AC | PRN
  Administered 2024-01-15: 25 ug via INTRAVENOUS
  Administered 2024-01-15: 50 ug via INTRAVENOUS
  Administered 2024-01-15: 25 ug via INTRAVENOUS
  Administered 2024-01-15 (×2): 50 ug via INTRAVENOUS

## 2024-01-15 MED ORDER — ACETAMINOPHEN 500 MG PO TABS
1000.0000 mg | ORAL_TABLET | Freq: Four times a day (QID) | ORAL | Status: DC | PRN
  Administered 2024-01-15 – 2024-01-16 (×2): 1000 mg via ORAL
  Filled 2024-01-15 (×2): qty 2

## 2024-01-15 NOTE — Sedation Documentation (Signed)
 Patient had BL 10.57fr nephrostomy tubes placed by Dr. Rosine. 4mg  versed  and 200mcg fentanyl  given during procedure. Tolerated without complication. Patient drowsy post procedure, remains on 1L nasal cannula upon return to floor, RN notified.

## 2024-01-15 NOTE — Progress Notes (Signed)
 CM attempted to speak with the patient to complete an assessment. The phone number that is listed as the patient's mobile and home number is the daughter's number [phone]). CM was unable to leave a voicemail due to voicemail being full. CM tried calling the patient's hospital phone [phone]) but was unable to speak with the patient. CM will continue to reach out to the patient and the patient's family.     Julianna Clause, MSW   Social Work Case Therapist, [employer]  [phone]

## 2024-01-15 NOTE — H&P (Signed)
 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    PAST MEDICAL HISTORY:   Medical History[1]    PAST SURGICAL HISTORY   Past Surgical History[2]    Family History:   Family History[3]    Social History:   Social History[4]      REVIEW OF SYSTEMS REVIEWED:   YES  ( x )        HOME MEDICATIONS   Prescriptions Prior to Admission[5]      INPATIENT MEDICATIONS    Current Facility-Administered Medications[6]    acetaminophen , dextrose  **OR** dextrose  **OR** dextrose  **OR** glucagon  (rDNA), ondansetron , oxybutynin , oxyCODONE       ALLERGIES:   Allergies[7]      PREVIOUS REACTION TO SEDATION MEDICATIONS     NO ( x )   YES (  )      PHYSICAL EXAM     AIRWAY CLASSIFICATION:    CLASS I   (  )   CLASS II  ( x )    CLASS III  (  )     CLASS IV  (  )    INTUBATED (  )    CARDIAC :   RRR      LUNGS:   NLR      LABS:     Lab Results   Component Value Date/Time    WBC 7.91 01/15/2024 04:55 AM    WBC 7.70 04/21/2021 12:18 PM    HCT 30.8 (L) 01/15/2024 04:55 AM    HCT 46.4 04/21/2021 12:18 PM    PLT 179 01/15/2024 04:55 AM    PLT 191 04/21/2021 12:18 PM    INR 0.9 01/13/2024 01:56 PM    INR 1.0 04/16/2021 04:23 PM    PT 11.0 01/13/2024 01:56 PM    PT 11.6 04/16/2021 04:23 PM    PTT 29 11/09/2023 03:31 PM    PTT 27 04/16/2021 09:17 PM    BUN 30 (H) 01/15/2024 07:59 AM    BUN 16.0 04/21/2021 12:18 PM    CREAT 1.5 01/15/2024 07:59 AM    CREAT 1.1 04/21/2021 12:18 PM    GLU 138 (H) 01/15/2024 07:59 AM    GLU 287 (H) 04/21/2021 12:18 PM    K 4.6 01/15/2024 07:59 AM    K 4.3 04/21/2021 12:18 PM           ASA PHYSICAL STATUS   (  )  ASA 1   HEALTHY PATIENT  (  )  ASA 2   MILD SYSTEMIC ILLNESS  ( x )  ASA 3   SYSTEMIC DISEASE, NOT INCAPACITATING  (  )  ASA 4   SEVERE SYSTEMIC DISEASE, DISEASE IS CONSTANT THREAT TO LIFE  (  )  ASA 5   MORIBUND CONDITION, NOT  EXPECTED TO LIVE >24 HOURS            IRRESPECTIVE OF PROCEDURE  (  )  E           EMERGENCY PROCEDURE       PLANNED SEDATION:   (  ) NO SEDATION  ( x ) MODERATE SEDATION  (  ) DEEP SEDATION WITH ANESTHESIA      CONCLUSION:   PATIENT HAS BEEN REASSESSED IMMEDIATELY PRIOR TO THE PROCEDURE  AND IS AN APPROPRIATE CANDIDATE FOR THE PLANNED SEDATION AND   PROCEDURE.  RISKS, BENEFITS AND ALTERNATIVES TO THE PLANNED   PROCEDURE AND SEDATION HAVE BEEN EXPLAINED TO THE PATIENT   OR GUARDIAN.    ( x )  YES  (  )  EMERGENCY CONSENT       Signed by: Camellia CINDERELLA Lanius, MD  [facility]-Section of Vascular & Interventional Radiology  Contact Numbers:  Regular business hours (8A-5P M-F):  [facility]:  [phone] (option 3-outpatient scheduling, option 4-consults, option 5-inpatient procedures)  [facility]:  [phone]  [facility]: [phone]  After hours/Answering service:  [phone]         [1]   Past Medical History:  Diagnosis Date    Hyperlipidemia     Hypertension     Insomnia     Irregular heart beat     Restless leg     Sleep apnea    [2]   Past Surgical History:  Procedure Laterality Date    ARTERIAL-  LOWER EXTREMITY ANGIOGRAPHY POSS PTA Right 11/09/2023    Procedure: Arterial-  Lower Extremity Angiography Poss PTA;  Surgeon: Marinus Longest, MD;  Location: [facility];  Service: Cardiovascular;  Laterality: Right;    ARTERIAL-  LOWER EXTREMITY ANGIOGRAPHY POSS PTA N/A 11/11/2023    Procedure: Arterial-  Lower Extremity Angiography Poss PTA;  Surgeon: Marinus Longest, MD;  Location: [facility];  Service: Cardiovascular;  Laterality: N/A;    BACK SURGERY  2016    CYSTOSCOPY, TRANSURETHAL RESECTION, BLADDER TUMOR N/A 01/13/2024    Procedure: CYSTOSCOPY, TRANSURETHAL RESECTION, BLADDER NEOPLASM, GEMCITABINE , TURP;  Surgeon: Ina Hosey FALCON, MD;  Location: [facility];  Service: Urology;  Laterality: N/A;    FEMORAL-POPLITEAL BYPASS Right  11/12/2023    Procedure: CREATION, BYPASS, ARTERIAL, FEMORAL TO POSTERIOR POPLITEAL; REVERSE SAPHENOUS VEIN BYPASS;  Surgeon: Marinus Longest, MD;  Location: [facility];  Service: Vascular;  Laterality: Right;  10/28-CARD SENT    SPINAL CORD STIMULATOR IMPLANT  2016   [3]   Family History  Problem Relation Name Age of Onset    Diabetes Mother      Liver cancer Mother      Cirrhosis Father      Diabetes Father      Brain cancer Sister      Heart attack Sister      Kidney failure Brother     [4]   Social History  Socioeconomic History    Marital status: Divorced   Tobacco Use    Smoking status: Every Day     Current packs/day: 1.00     Average packs/day: 1 pack/day for 64.0 years (64.0 ttl pk-yrs)     Types: Cigarettes     Start date: 1962    Smokeless tobacco: Never   Vaping Use    Vaping status: Never Used   Substance and Sexual Activity    Alcohol use: Never    Drug use: Never     Social Drivers of Psychologist, Prison [employer] Prob[employer] Services Strain: High Risk (11/04/2023)    Overall Financial Resource Strain (CARDIA)     Difficulty of Paying Living Expenses: Hard   Food Insecurity: No Food Insecurity (01/13/2024)    Hunger Vital Sign     Worried About Running Out of Food in the Last Year: Never true     Ran Out of Food in the Last Year: Never true   Transportation Needs: No Transportation Needs (  01/13/2024)    PRAPARE - Therapist, Art (Medical): No     Lack of Transportation (Non-Medical): No   Physical Activity: Sufficiently Active (12/31/2023)    Exercise Vital Sign     Days of Exercise per Week: 7 days     Minutes of Exercise per Session: 60 min   Stress: No Stress Concern Present (01/15/2022)    Received from [facility]    [facility] - Occupational Stress Questionnaire     Feeling of Stress : Only a little   Social Connections: Socially Isolated (01/15/2022)    Received from [facility]    Social Connection and  Isolation Panel     In a typical week, how many times do Tatym Schermer talk on the phone with family, friends, or neighbors?: More than three times a week     How often do Lelynd Poer get together with friends or relatives?: Never     How often do Lasasha Brophy attend church or religious services?: Never     Do Roscoe Witts belong to any clubs or organizations such as church groups, unions, fraternal or athletic groups, or school groups?: No     How often do Arbie Reisz attend meetings of the clubs or organizations Sadie Hazelett belong to?: Never     Are Deira Shimer married, widowed, divorced, separated, never married, or living with a partner?: Separated   Intimate Partner Violence: Not At Risk (01/13/2024)    Humiliation, Afraid, Rape, and Kick questionnaire     Fear of Current or Ex-Partner: No     Emotionally Abused: No     Physically Abused: No     Sexually Abused: No   Housing Stability: Not At Risk (01/13/2024)    Housing Stability NCSS     Do Tamara Kenyon have housing?: Yes     Are Ronon Ferger worried about losing your housing?: No   [5]   Medications Prior to Admission   Medication Sig Dispense Refill Last Dose/Taking    amLODIPine  (NORVASC ) 5 MG tablet Take 1 tablet (5 mg) by mouth once daily Pt ran out of this medication 90 tablet 3 01/12/2024    atorvastatin  (LIPITOR) 80 MG tablet Take 1 tablet (80 mg) by mouth nightly 30 tablet 0 01/12/2024    Continuous Glucose Receiver (FreeStyle Libre 3 Reader) Device Use 4 times daily - with meals and at bedtime 1 each 1 01/12/2024    Continuous Glucose Sensor (FreeStyle Libre 3 Sensor) Misc Use 1 Application every 14 (fourteen) days 6 each 2 01/12/2024    metFORMIN  (GLUCOPHAGE ) 1000 MG tablet Take 1 tablet (1,000 mg) by mouth 2 (two) times daily with meals   01/12/2024    metoprolol  tartrate (LOPRESSOR ) 50 MG tablet Take 1 tablet (50 mg) by mouth 2 (two) times daily Hold for blood pressure lower than 100/60 180 tablet 3 01/12/2024    apixaban  (ELIQUIS ) 2.5 MG tablet Take 1 tablet (2.5 mg) by mouth every 12 (twelve) hours 60 tablet 0      aspirin  EC 81 MG EC tablet Take 1 tablet (81 mg) by mouth once daily   01/11/2024    gabapentin  (NEURONTIN ) 300 MG capsule Take 2 capsules (600 mg) by mouth 3 (three) times daily 180 capsule 0    [6]   Current Facility-Administered Medications   Medication Dose Route Frequency    amLODIPine   5 mg Oral Daily    atorvastatin   80 mg Oral QHS    chloroprocaine   30 mL Other Once    gabapentin   300 mg Oral BID    insulin  lispro  1-5 Units Subcutaneous TID AC    metoprolol  tartrate  50 mg Oral BID    piperacillin -tazobactam  4.5 g Intravenous Once   [7]   Allergies  Allergen Reactions    Strawberry C [Ascorbate] Hives    Strawberry Extract Hives    Lidocaine       Other Reaction(s): Headache/ Heart Palpitations

## 2024-01-15 NOTE — Progress Notes (Signed)
 PROGRESS NOTE  [facility]  [phone]  If unreachable/after hours call [phone]    Date Time: 01/15/2024 8:42 AM  Patient Name: [facility]      Assessment:   78 y.o. M s/p cysto, TURBT, TURP and instillation of gemcitabine  POD #2. Post op CTAP with moderate BL hydro    Renal: Cr 1.5, UOP 1175  Heme: H/H 9.9/30.8  ID: WBC 7.91, AF     Plan:   BL PCNS today. Timing per IR  Remain NPO  May resume diet after PCN placement  Maintain foley catheter. Will consider removal pending UOP after PCN placement   OOB and ambulating  AM labs  Please call with questions or concerns     The assessment and plan were discussed with Donnice Plummer, MD and formulated together as documented.  MDM:  The above Assessment and plan were formulated by myself and Laymon Archer, NP, and the above has been reviewed and any changes or caveats are dictated below.   - Patient seen and examined. Urine translucent with pink tinge. CT C/A/P yesterday showed moderate bilateral hydroureteronephrosis to the level of the bladder despite foley catheter; discussed PCNs, rationale for PCNs, and the procedure; patient in agreement with plan      Subjective:   Anxious about upcoming PCNS, otherwise doing well     Medications:     Current Medications[1]     Physical Exam:     Temp:  [97.4 F (36.3 C)-98.6 F (37 C)] 97.4 F (36.3 C)  Heart Rate:  [68-107] 104  Resp Rate:  [14-18] 14  BP: (99-148)/(56-94) 139/94    Intake and Output Summary (Last 24 hours) at Date Time    Intake/Output Summary (Last 24 hours) at 01/15/2024 0842  Last data filed at 01/15/2024 9177  Gross per 24 hour   Intake 1300 ml   Output 1475 ml   Net -175 ml       Output by Drain (mL) 01/13/24 0701 - 01/13/24 1900 01/13/24 1901 - 01/14/24 0700 01/14/24 0701 - 01/14/24 1900 01/14/24 1901 - 01/15/24 0700 01/15/24 0701 - 01/15/24 0842   Requested LDAs do not have output data documented.       Physical Exam:     Constitutional: Vital signs reviewed. Nontoxic and  comfortable appearing male in NAD   Head: Normocephalic  Respiratory/Chest:  No respiratory distress. Nl effort.   Cardiovascular: Regular rate  GU: yellow urine via foley catheter off CBI  Neurological: Speech normal. GCS 15  Skin: Warm and dry  Psychiatric: Awake and alert. Normal affect.     Labs:     Results for orders placed or performed during the hospital encounter of 01/13/24 (from the past 24 hours)    Collection Time: 01/14/24 10:49 AM   Result Value    Whole Blood Glucose POCT 102 (H)    Collection Time: 01/14/24  4:52 PM   Result Value    Whole Blood Glucose POCT 246 (H)   CBC with Differential (Component)    Collection Time: 01/15/24  4:55 AM   Result Value    WBC 7.91    Hemoglobin 9.9 (L)    Hematocrit 30.8 (L)    Platelet Count 179    MPV 9.3    RBC 3.57 (L)    MCV 86.3    MCH 27.7    MCHC 32.1    RDW 16 (H)    nRBC % 0.0    Absolute nRBC 0.00    Preliminary Absolute  Neutrophil Count 5.04    Neutrophils % 63.6    Lymphocytes % 27.1    Monocytes % 5.2    Eosinophils % 3.2    Basophils % 0.8    Immature Granulocytes % 0.1    Absolute Neutrophils 5.04    Absolute Lymphocytes 2.14    Absolute Monocytes 0.41    Absolute Eosinophils 0.25    Absolute Basophils 0.06    Absolute Immature Granulocytes 0.01    Collection Time: 01/15/24  7:21 AM   Result Value    Whole Blood Glucose POCT 146 (H)   Basic Metabolic Panel    Collection Time: 01/15/24  7:59 AM   Result Value    Glucose 138 (H)    BUN 30 (H)    Creatinine 1.5    Calcium  8.6    Sodium 135    Potassium 4.6    Chloride 104    CO2 26    Anion Gap 5.0    GFR 48.0 (L)         Rads:   CT Chest Abdomen Pelvis WO Contrast  Result Date: 01/14/2024   1.Moderate bilateral hydroureteronephrosis upstream from the urinary bladder, without identification of a cause. 2.Diffuse urinary bladder wall thickening of uncertain etiology. 3.Marked prostatomegaly. Windy K. Matich, MD 01/14/2024 5:23 PM       Additional Diagnoses:                 Acute blood loss anemia  requiring monitoring     Highest Value Lowest Value Decrease   Hemoglobin 12.2 g/dl  87/70/7974  8:43 PM 9.9 g/dl  87/68/7974  5:44 AM -2.3 g/dl   Hematocrit 62.1%  87/70/7974  1:56 PM  30.8%  01/15/2024  4:55 AM -7%            Signed by: Laymon DELENA Archer, NP  Laymon DELENA Archer, NP  Laymon Archer, NP  [facility]  [phone]  [facility]  [phone]   IF unreachable/after hours call [phone]          [1]   Current Facility-Administered Medications:     acetaminophen  (TYLENOL ) tablet 500 mg, 500 mg, Oral, Q4H PRN, Zaza, Dima, NP    amLODIPine  (NORVASC ) tablet 5 mg, 5 mg, Oral, Daily, Zaza, Dima, NP, 5 mg at 01/15/24 0745    atorvastatin  (LIPITOR) tablet 80 mg, 80 mg, Oral, QHS, Zaza, Dima, NP, 80 mg at 01/15/24 0013    chloroprocaine  (NESACAINE ) 3 % injection 30 mL, 30 mL, Other, Once, Toor, Zeeshan, MD    dextrose  (GLUCOSE) 40 % oral gel 15 g of glucose, 15 g of glucose, Oral, PRN **OR** dextrose  (D10W) 10% bolus 125 mL, 12.5 g, Intravenous, PRN **OR** dextrose  50 % bolus 12.5 g, 12.5 g, Intravenous, PRN **OR** glucagon  (rDNA) (GLUCAGEN) injection 1 mg, 1 mg, Intramuscular, PRN, Erminio Donnice PARAS, MD    gabapentin  (NEURONTIN ) capsule 300 mg, 300 mg, Oral, BID, Holt, Brett F, MD    insulin  lispro injection 1-5 Units, 1-5 Units, Subcutaneous, TID AC, 2 Units at 01/14/24 1809 **AND** NSG Communication: Glucose POCT order, , , AC, Erminio Donnice PARAS, MD    metoprolol  tartrate (LOPRESSOR ) tablet 50 mg, 50 mg, Oral, BID, Zaza, Dima, NP, 50 mg at 01/15/24 0745    ondansetron  (ZOFRAN ) injection 4 mg, 4 mg, Intravenous, Q8H PRN, Zaza, Dima, NP    oxybutynin  (DITROPAN ) tablet 5 mg, 5 mg, Oral, Q8H PRN, Zaza, Dima, NP, 5 mg at 01/13/24 1555    oxyCODONE  (ROXICODONE ) immediate release tablet 5 mg,  5 mg, Oral, Q4H PRN, Zaza, Dima, NP, 5 mg at 01/15/24 0227    piperacillin -tazobactam (ZOSYN ) 4.5 g in sodium chloride  0.9 % 100 mL IVPB mini-bag plus, 4.5 g, Intravenous, Once, Toor, Zeeshan, MD

## 2024-01-15 NOTE — Sedation Documentation (Signed)
 Patient given first dose of sedation and IV appeared to be leaking. Patient with no effect of sedation meds. New PIV inserted and additional meds given with good effect.

## 2024-01-15 NOTE — Progress Notes (Signed)
 01/15/24 1652   Healthcare Decisions   Interviewed: Patient   Interviewee Contact Information: Met with pt in room   Orientation/Decision Making Abilities of Patient Alert and Oriented x3, able to make decisions   Advance Directive Patient does not have advance directive   (RETIRED) Healthcare Agent's Phone Number Venus [phone]   Prior to admission   Prior level of function Needs assistance with ADLs;Ambulates with assistive device   Type of Residence Private residence   How do you get to your MD appointments? Dtr takes   How do you get your groceries? Dtr   Who fixes your meals? Dtr   Who does your laundry? dtr   Who picks up your prescriptions? dtr   Dressing Needs assistance   Grooming Independent   Feeding Independent   Bathing Needs assistance   Toileting Independent   DME Currently at [facility];Crutches   Home Care/Community Services None   Discharge Planning   Support Systems Family members   Expected Discharge Disposition [facility] Services   Anticipated [place] plan discussed with: Same as interviewed   Mode of transportation: Private car (family member)   Does the patient have perscription coverage? Yes   Financial Resource Strain   How hard is it for you to pay for the very basics like food, housing, medical care, and heating? Somewhat   Housing   Do you have housing? Y   Are you worried about losing your housing? N   Transportation Needs   In the past 12 months, has lack of transportation kept you from medical appointments or from getting medications? no   In the past 12 months, has lack of transportation kept you from meetings, work, or from getting things needed for daily living? No   Family and PCP   PCP on file was verified as the current PCP? Yes   In case you are admitted, transferred or discharged, would like family notified? Yes   Name of family member to be notified Dtr Venus Silvan   Important Message from [facility] Notice   Patient received 1st IMM Letter?  n/a  (Observation)     Case Management location: onsite    Met with pt in room Introduced self and CM role   Pt is s/p Cystoscopy with TURBT for bladder  Also S/P per neph tube places on 12/31 Pt reports lives with dtr and family who assist pt as needed  Pt is requesting HH for inst in care of PCN tubes   Referrla for [facility] places     Emmie Daring RN   Case manager   [phone]

## 2024-01-15 NOTE — Plan of Care (Signed)
 PROGRESS NOTE:    Vitals: VSS  Neuro: A&O x4. 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 Pt presents as fatigued.  Respiratory: Regular, unlabored respirations. Clear breath sounds bilaterally. Denies SOB. RA.   Cardiac: PMH of A-fib. Radial and pedal pulses +2 bilaterally. Denies chest pain.   GI: Continent; last BM 12/29. Rounded abdomen with hyperactive bowel sounds. Flatus (-). Pt endorses bloating. Consistent carbohydrate diet. AC.   GU: Presence of urinary catheter with x2 PCN to BL flank.  Skin: Generally friable skin. Scattered abrasions, bruising, and scars. Cracking to BL heels. Blanchable redness to back and BL heels. Surgical incisions - x2 PCN to BL flank.     Musculoskeletal: Full ROM of all extremities with generalized weakness. Amputation of R great toe. Limited movement to upper/lower back.   Pain: Mild to moderate pain reported throughout shift; managed with PRN and scheduled medications.   Mobility: x1 assist with walker.   LDA/Drips: PIV - R hand (22 g.); flushed and saline locked. Urethral catheter (22 fr.). Nephrostomy (10.2 fr.) to R and L flank.    Fall Score/Safety: Moderate Fall Risk Score - Safety Precautions Implemented  Shift Events:  Plan: Pain/nausea management. AM labs. Foley care; irrigate manually PRN. Nephrostomy drain care. Consistent carbohydrate diet. AC; target glucose goal 140-180 mg/dL. Strict I/Os. Encourage IS use + SCDs + activity/ambulation as tolerated.     Problem: Moderate/High Fall Risk Score >5  Goal: Patient will remain free of falls  Outcome: Progressing  Flowsheets (Taken 01/15/2024 1930)  Moderate Risk (6-13):   MOD-Consider activation of bed alarm if appropriate   MOD-Apply bed exit alarm if patient is confused   MOD-Floor mat at bedside (where available) if appropriate   MOD-Re-orient confused patients   MOD-Utilize diversion activities   MOD-include family in multidisciplinary POC discussions     Problem: Pain interferes with ability to perform ADL  Goal: Pain at adequate  level as identified by patient  Outcome: Progressing  Flowsheets (Taken 01/15/2024 2216)  Pain at adequate level as identified by patient:   Identify patient comfort function goal   Assess for risk of opioid induced respiratory depression, including snoring/sleep apnea. Alert healthcare team of risk factors identified.   Assess pain on admission, during daily assessment and/or before any as needed intervention(s)   Reassess pain within 30-60 minutes of any procedure/intervention, per Pain Assessment, Intervention, Reassessment (AIR) Cycle   Evaluate if patient comfort function goal is met   Evaluate patient's satisfaction with pain management progress   Offer non-pharmacological pain management interventions   Include patient/patient care companion in decisions related to pain management as needed     Problem: Side Effects from Pain Analgesia  Goal: Patient will experience minimal side effects of analgesic therapy  Outcome: Progressing  Flowsheets (Taken 01/15/2024 2216)  Patient will experience minimal side effects of analgesic therapy:   Monitor/assess patient's respiratory status (RR depth, effort, breath sounds)   Assess for changes in cognitive function   Prevent/manage side effects per LIP orders (i.e. nausea, vomiting, pruritus, constipation, urinary retention, etc.)   Evaluate for opioid-induced sedation with appropriate assessment tool (i.e. POSS)     Problem: Bladder/Voiding  Goal: Patient will experience proper bladder emptying during admission and remain free from infection  Outcome: Progressing  Flowsheets (Taken 01/15/2024 2216)  Patient will experience proper bladder emptying during admission and remain free from infection:   Apply urinary containment device as appropriate and/or per order   Utilize bladder scans prior to or post void as appropriate  Encourage patient to identify medications that aid bladder function prior to administration   Encourage bladder emptying at regular intervals   Assess  need for indwelling catheter every shift and discuss with LIP     Problem: Inadequate Airway Clearance  Goal: Patent Airway maintained  Outcome: Progressing  Flowsheets (Taken 01/15/2024 2216)  Patent airway maintained:   Position patient for maximum ventilatory efficiency   Provide adequate fluid intake to liquefy secretions   Suction secretions as needed   Reinforce use of ordered respiratory interventions (i.e. CPAP, BiPAP, Incentive Spirometer, Acapella, etc.)   Reposition patient every 2 hours and as needed unless able to self-reposition  Goal: Normal respiratory rate/effort achieved/maintained  Outcome: Progressing  Flowsheets (Taken 01/15/2024 2216)  Normal respiratory rate/effort achieved/maintained: Plan activities to conserve energy: plan rest periods     Problem: Infection Prevention  Goal: Free from infection  Outcome: Progressing  Flowsheets (Taken 01/15/2024 2216)  Free from infection:   Monitor/assess vital signs   Encourage/assist patient to turn, cough and perform deep breathing every 2 hours   Assess incision for evidence of healing   Monitor/assess output from surgical drain if present   Assess for signs and symptoms of infection   Monitor/assess lab values and report abnormal values     Problem: Impaired Mobility  Goal: Mobility/Activity is maintained at optimal level for patient  Outcome: Progressing  Flowsheets (Taken 01/15/2024 2216)  Mobility/activity is maintained at optimal level for patient: Encourage independent activity per ability     Problem: Constipation  Goal: Fluid and electrolyte balance are achieved/maintained  Outcome: Progressing  Flowsheets (Taken 01/15/2024 2216)  Fluid and electrolyte balance are achieved/maintained:   Monitor/assess lab values and report abnormal values   Assess and reassess fluid and electrolyte status   Observe for cardiac arrhythmias   Monitor for muscle weakness  Goal: Elimination patterns are normal or improving  Outcome: Progressing  Goal: Nutritional  intake is adequate  Outcome: Progressing  Goal: Mobility/Activity is maintained at optimal level for patient  Outcome: Progressing  Flowsheets (Taken 01/15/2024 2216)  Mobility/activity is maintained at optimal level for patient: Encourage independent activity per ability     Problem: Inadequate Gas Exchange  Goal: Patent Airway maintained  Outcome: Progressing  Flowsheets (Taken 01/15/2024 2216)  Patent airway maintained:   Position patient for maximum ventilatory efficiency   Provide adequate fluid intake to liquefy secretions   Suction secretions as needed   Reinforce use of ordered respiratory interventions (i.e. CPAP, BiPAP, Incentive Spirometer, Acapella, etc.)   Reposition patient every 2 hours and as needed unless able to self-reposition  Goal: Adequate oxygenation and improved ventilation  Outcome: Progressing  Flowsheets (Taken 01/15/2024 2216)  Adequate oxygenation and improved ventilation:   Assess lung sounds   Monitor SpO2 and treat as needed   Provide mechanical and oxygen  support to facilitate gas exchange   Position for maximum ventilatory efficiency   Teach/reinforce use of incentive spirometer 10 times per hour while awake, cough and deep breath as needed   Plan activities to conserve energy: plan rest periods   Increase activity as tolerated/progressive mobility     Problem: Artificial Airway  Goal: Endotracheal tube will be maintained  Outcome: Progressing  Goal: 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  Outcome: Progressing     Problem: Compromised Sensory Perception  Goal: Sensory Perception Interventions  Outcome: Progressing  Flowsheets (Taken 01/15/2024 2216)  Sensory Perception Interventions: Offload heels, Pad bony prominences, Reposition q 2hrs/turn Clock, Q2 hour skin assessment under devices if present  Problem: Compromised Moisture  Goal: Moisture level Interventions  Outcome: Progressing  Flowsheets (Taken 01/15/2024 2216)  Moisture level Interventions: Moisture wicking products, Moisture  barrier cream     Problem: Compromised Activity/Mobility  Goal: Activity/Mobility Interventions  Outcome: Progressing  Flowsheets (Taken 01/15/2024 2216)  Activity/Mobility Interventions: Pad bony prominences, TAP Seated positioning system when OOB, Promote PMP, Reposition q 2 hrs / turn clock, Offload heels     Problem: Compromised Friction/Shear  Goal: Friction and Shear Interventions  Outcome: Progressing  Flowsheets (Taken 01/15/2024 2216)  Friction and Shear Interventions: Pad bony prominences, Off load heels, HOB 30 degrees or less unless contraindicated, Consider: TAP seated positioning, Heel foams     Problem: Compromised Nutrition  Goal: Nutrition Interventions  Outcome: Progressing  Flowsheets (Taken 01/15/2024 2216)  Nutrition Interventions: Discuss nutrition at Rounds, I&Os, Document % meal eaten, Daily weights     Problem: Safety  Goal: Patient will be free from injury during hospitalization  Outcome: Progressing  Flowsheets (Taken 01/15/2024 2216)  Patient will be free from injury during hospitalization:   Assess patient's risk for falls and implement fall prevention plan of care per policy   Provide and maintain safe environment   Use appropriate transfer methods   Ensure appropriate safety devices are available at the bedside   Include patient/ family/ care giver in decisions related to safety   Hourly rounding   Assess for patients risk for elopement and implement Elopement Risk Plan per policy   Provide alternative method of communication if needed (communication boards, writing)  Goal: Patient will be free from infection during hospitalization  Outcome: Progressing  Flowsheets (Taken 01/15/2024 2216)  Free from Infection during hospitalization:   Assess and monitor for signs and symptoms of infection   Monitor lab/diagnostic results   Monitor all insertion sites (i.e. indwelling lines, tubes, urinary catheters, and drains)   Encourage patient and family to use good hand hygiene technique      Problem: Discharge Barriers  Goal: Patient will be discharged home or other facility with appropriate resources  Outcome: Progressing  Flowsheets (Taken 01/15/2024 2216)  Discharge to home or other facility with appropriate resources: Provide appropriate patient education     Problem: Psychosocial and Spiritual Needs  Goal: Demonstrates ability to cope with hospitalization/illness  Outcome: Progressing  Flowsheets (Taken 01/15/2024 2216)  Demonstrates ability to cope with hospitalizations/illness:   Encourage verbalization of feelings/concerns/expectations   Provide quiet environment   Assist patient to identify own strengths and abilities   Encourage patient to set small goals for self   Encourage participation in diversional activity   Reinforce positive adaptation of new coping behaviors   Include patient/ patient care companion in decisions     Problem: Fluid and Electrolyte Imbalance/ Endocrine  Goal: Adequate hydration  Outcome: Progressing  Flowsheets (Taken 01/15/2024 2216)  Adequate hydration:   Assess mucus membranes, skin color, turgor, perfusion and presence of edema   Assess for peripheral, sacral, periorbital and abdominal edema   Monitor and assess vital signs and perfusion     Problem: Nutrition  Goal: Food and/or nutrient delivery  Outcome: Progressing  Flowsheets (Taken 01/15/2024 2216)  Food and/or nutrient delivery: Provide a pleasant environment during mealtime     Problem: Diabetes: Glucose Imbalance  Goal: Blood glucose stable at established goal  Outcome: Progressing  Flowsheets (Taken 01/15/2024 2216)  Blood glucose stable at established goal:   Monitor lab values   Monitor intake and output.  Notify LIP if urine output is < 30 mL/hour.  Follow fluid restrictions/IV/PO parameters   Include patient/family in decisions related to nutrition/dietary selections   Assess for hypoglycemia /hyperglycemia   Monitor/assess vital signs   Coordinate medication administration with meals, as  indicated   Ensure appropriate diet and assess tolerance   Ensure adequate hydration   Ensure appropriate consults are obtained (Nutrition, Diabetes Education, and Case Management)   Ensure patient/family has adequate teaching materials

## 2024-01-15 NOTE — Plan of Care (Signed)
 Problem: Moderate/High Fall Risk Score >5  Goal: Patient will remain free of falls  Flowsheets (Taken 01/15/2024 0753)  Moderate Risk (6-13):   MOD-Re-orient confused patients   MOD-Place bedside commode and assistive devices out of sight when not in use   MOD-Remain with patient during toileting   MOD-Consider a move closer to Nurses Station   MOD-Floor mat at bedside (where available) if appropriate   MOD-Apply bed exit alarm if patient is confused   MOD-Consider activation of bed alarm if appropriate     Problem: Pain interferes with ability to perform ADL  Goal: Pain at adequate level as identified by patient  Flowsheets (Taken 01/15/2024 1228)  Pain at adequate level as identified by patient: Identify patient comfort function goal     Problem: Inadequate Airway Clearance  Goal: Patent Airway maintained  Flowsheets (Taken 01/15/2024 1228)  Patent airway maintained: Reposition patient every 2 hours and as needed unless able to self-reposition     Problem: Constipation  Goal: Fluid and electrolyte balance are achieved/maintained  Flowsheets (Taken 01/15/2024 1228)  Fluid and electrolyte balance are achieved/maintained:   Monitor/assess lab values and report abnormal values   Monitor for muscle weakness     Problem: Inadequate Gas Exchange  Goal: Patent Airway maintained  Flowsheets (Taken 01/15/2024 1228)  Patent airway maintained: Reposition patient every 2 hours and as needed unless able to self-reposition  Goal: Adequate oxygenation and improved ventilation  Flowsheets (Taken 01/15/2024 1228)  Adequate oxygenation and improved ventilation:   Assess lung sounds   Teach/reinforce use of incentive spirometer 10 times per hour while awake, cough and deep breath as needed     Problem: Compromised Sensory Perception  Goal: Sensory Perception Interventions  Flowsheets (Taken 01/15/2024 1228)  Sensory Perception Interventions: Offload heels, Pad bony prominences, Reposition q 2hrs/turn Clock, Q2 hour skin assessment  under devices if present     Problem: Compromised Activity/Mobility  Goal: Activity/Mobility Interventions  Flowsheets (Taken 01/15/2024 1228)  Activity/Mobility Interventions: Pad bony prominences, TAP Seated positioning system when OOB, Promote PMP, Reposition q 2 hrs / turn clock, Offload heels

## 2024-01-15 NOTE — Provider Clarification Note (Signed)
 Patient Name: Scott Monroe, Scott Monroe.  Account #: [id number]   MR #: [id number]  Discharge Date: @DCDATE @          Thank you for responding!    Documentation Query sent by: Darice CHRISTELLA Sarks  Date:  01/15/2024        PROVIDER RESPONSE Insert a query response from the list above or add free text: CKD 3b

## 2024-01-15 NOTE — Nursing Progress Note (Signed)
 Patient has questions and concerns about the medical necessity of todays upcoming procedure. Patient states feeling uncertain about proceeding with the procedure but is willing to remain NPO and wait to speak with the surgeon before making a decision.

## 2024-01-15 NOTE — Discharge Instr - AVS First Page (Addendum)
 Interventional Radiology  Discharge Instructions for Percutaneous Nephrostomy Tube    A percutaneous nephrostomy tube was placed today.  It was placed to drain urine from your kidney to prevent pain, infection or kidney damage.  A thin flexible tube will stay in place until the problem is resolved.  This may remain in place for several days to months.  You will periodically have tube checks.      Activity:  Do not lift anything heavier than 10 pounds until your primary care doctor or Urologist says it is okay.  Avoid strenuous activities, such as mowing the lawn, vacuuming, playing sports, or engaging in any activity that will cause your tubing to be pulled or moved.  Slowly increase your activity level with short, frequent walks 3-4 times a day.  Do not drive while you are still taking pain medication. Wait until your primary care doctor says it is okay to drive.    Drain care:   Dressing changes every 2 days or if dressing is soiled.  Clean around the tube with saline or hydrogen peroxide.  Dress the insertion site with gauze and tape.  Position the tube so that it does not kink.  Wear loose, comfortable clothes that will not pull or kink the tube.  Empty the contents of drainage bag daily and record the amount.   Empty the bag when it is one-half to two-thirds full.  Always empty the bag before you go to bed.  Flush the drain as directed with 10 ml of normal saline every 2 days.  Showers only--NO TUB BATHS:  Cover the dressing with a double layer of plastic wrap and tape to the edges to keep dry while you shower.  Remove the plastic wrap and change dressing following your shower.    When to call the Interventional Radiologist:   A catheter that is not draining (blocked or plugged)  The catheter comes out (Do NOT try to put it back in).  Pain, redness, or discharge around catheter  Fever above 101 degrees or chills  Leakage at catheter site  A noticeable increase or decrease in the amount of urine that  drains  Cloudy or foul-smelling urine  Urine that changes to a pink or red color  Bloody urine  Increased pain  Severe pain in your side  Back pain  Nausea and vomiting    Follow-up: Return for your next check/change in 6-8 weeks    If the tube plugs or there is concern for infection, keep saline, a syringe and drainage bag available, then call the Interventional Radiologist.       Contact Information:  Business Hours (Mon-Fri, 8 AM-5 PM):  Interventional Radiology Central Scheduling: [phone]  IR Patient Care Navigator (post-procedure questions/issues): [phone]  After Hours:  Answering Service (IR on call doctor): [phone]   For emergencies, call 911 or go to the nearest ER or urgent care.    I have received and understand these discharge instructions. I have no further questions regarding these instructions.       Scan the QR code to learn how to flush the drain with saline flush         Home Health Referral     Referral from Emmie Daring (Case Manager) for home health care upon discharge.     By [employer], the patient has the right to freely choose a home care provider.     A company of the patients choosing. We have supplied the patient with a listing  of providers in your area who asked to be included and participate in Medicare.   [facility] a home care agency that provides adult home care services and participates in Medicare   The preferred provider of your insurance company. Choosing a home care provider other than your insurance company's preferred provider may affect your insurance coverage.        Home Health Discharge Information     Your doctor has ordered Skilled Nursing in-home service(s) for you while you recuperate at home, to assist you in the transition from hospital to home.     The agency that you or your representative chose to provide the service:   [facility]  Phone: [phone]        The above services were set up by:  Lianne Birmingham, RN (Post  Acute Care Coordinator)   Phone: [phone]        IF YOU HAVE NOT HEARD FROM YOUR HOME HEALTH AGENCY WITHIN 24-48 HOURS AFTER DISCHARGE PLEASE CALL YOUR AGENCY TO ARRANGE A TIME FOR YOUR FIRST VISIT. FOR ANY SCHEDULING CONCERNS OR QUESTIONS RELATED TO HOME HEALTH, SUCH AS TIME OR DATE PLEASE CONTACT YOUR HOME HEALTH AGENCY AT THE NUMBER LISTED ABOVE.    Discharge Instructions: Caring for Your Indwelling Urinary Catheter  You have been discharged with an indwelling urinary catheter (also called a Foley catheter ). A catheter is a thin, flexible tube. An indwelling urinary catheter has two parts. The first part is a tube that drains urine from your bladder. The secondpart is a bag or other device that collects the urine.   The most important thing to remember is that you want to prevent infection.Always wash your hands before handling your catheter bag or tubing.   Draining the bedside bag  Wash your hands with soap and clean, running water . Or use an alcohol-based hand sanitizer that contains at least 60% alcohol.  Hold the drainage tube over a toilet or measuring container.  Unclamp the tube and let the bag drain.  Dont touch the tip of the drainage tube or let it touch the toilet or container.  You don't need to rinse the bag or drainage tube.    Cleaning the drainage tube  When the bag is empty, clean the tip of the drainage tube with an alcohol wipe.  Clamp the tube.  Reinsert the tube into the pocket on the drainage bag.    Cleaning your skin and tubing  Clean the skin near the catheter with soap and water .  Wash your genital area from front to back.  Wash the catheter tubing. Always wash the catheter in the direction away from your body.  You will be told when and how to change your bag and tubing.  Dont try to remove the catheter by yourself.  You may shower with the catheter in place.    Emptying a leg bag  Wash your hands.  Remove the stopper on the bag.  Drain the bag into the toilet or a measuring  container. Dont let the tip of the drainage tube touch anything, including your fingers.  Clean the tip of the drainage tube with alcohol.  Replace the stopper.    Follow-up care  Make a follow-up appointment, or as directed by your healthcare provider   When to call your healthcare provider  Call your healthcare provider right away if you have any of the following:  Fever of 100.34F ( 38C) or higher, or as directed by  your provider  Chills  Leakage around the catheter insertion site  Increased spasms (uncontrollable twitching) in your legs, belly (abdomen), or bladder. Occasional mild spasms are normal.  Burning in the urinary tract, penis, or genital area  Nausea and vomiting  Aching in the lower back  Cloudy or bloody (pink or red) urine, sediment or mucus in the urine, or bad-smelling urine  [employer] last reviewed this educational content on12/01/2017     2000-2022 [employer], [place]. All rights reserved. This information is not intended as a substitute for professional medical care. Always follow yourhealthcare professional's instructions.

## 2024-01-16 LAB — LAB USE ONLY - CBC WITH DIFFERENTIAL
Absolute Basophils: 0.05 x10 3/uL (ref 0.00–0.08)
Absolute Eosinophils: 0.28 x10 3/uL (ref 0.00–0.44)
Absolute Immature Granulocytes: 0.02 x10 3/uL (ref 0.00–0.07)
Absolute Lymphocytes: 2.05 x10 3/uL (ref 0.42–3.22)
Absolute Monocytes: 0.29 x10 3/uL (ref 0.21–0.85)
Absolute Neutrophils: 4.13 x10 3/uL (ref 1.10–6.33)
Absolute nRBC: 0 x10 3/uL (ref <=0.00)
Basophils %: 0.7 %
Eosinophils %: 4.1 %
Hematocrit: 28.7 % — ABNORMAL LOW (ref 37.6–49.6)
Hemoglobin: 9.5 g/dL — ABNORMAL LOW (ref 12.5–17.1)
Immature Granulocytes %: 0.3 %
Lymphocytes %: 30.1 %
MCH: 28.6 pg (ref 25.1–33.5)
MCHC: 33.1 g/dL (ref 31.5–35.8)
MCV: 86.4 fL (ref 78.0–96.0)
MPV: 9.8 fL (ref 8.9–12.5)
Monocytes %: 4.3 %
Neutrophils %: 60.5 %
Platelet Count: 171 x10 3/uL (ref 142–346)
Preliminary Absolute Neutrophil Count: 4.13 x10 3/uL (ref 1.10–6.33)
RBC: 3.32 x10 6/uL — ABNORMAL LOW (ref 4.20–5.90)
RDW: 16 % — ABNORMAL HIGH (ref 11–15)
WBC: 6.82 x10 3/uL (ref 3.10–9.50)
nRBC %: 0 /100{WBCs} (ref <=0.0)

## 2024-01-16 LAB — WHOLE BLOOD GLUCOSE POCT
Whole Blood Glucose POCT: 146 mg/dL — ABNORMAL HIGH (ref 70–100)
Whole Blood Glucose POCT: 213 mg/dL — ABNORMAL HIGH (ref 70–100)
Whole Blood Glucose POCT: 236 mg/dL — ABNORMAL HIGH (ref 70–100)

## 2024-01-16 MED ORDER — GABAPENTIN 300 MG PO CAPS
300.0000 mg | ORAL_CAPSULE | Freq: Once | ORAL | Status: AC | PRN
  Administered 2024-01-16: 300 mg via ORAL
  Filled 2024-01-16: qty 1

## 2024-01-16 NOTE — Plan of Care (Signed)
 Problem: Pain interferes with ability to perform ADL  Goal: Pain at adequate level as identified by patient  Flowsheets (Taken 01/16/2024 0830)  Pain at adequate level as identified by patient:   Identify patient comfort function goal   Reassess pain within 30-60 minutes of any procedure/intervention, per Pain Assessment, Intervention, Reassessment (AIR) Cycle     Problem: Bladder/Voiding  Goal: Patient will experience proper bladder emptying during admission and remain free from infection  Flowsheets (Taken 01/16/2024 0830)  Patient will experience proper bladder emptying during admission and remain free from infection: Encourage patient to identify medications that aid bladder function prior to administration     Problem: Inadequate Airway Clearance  Goal: Patent Airway maintained  Flowsheets (Taken 01/16/2024 0830)  Patent airway maintained: Reposition patient every 2 hours and as needed unless able to self-reposition     Problem: Impaired Mobility  Goal: Mobility/Activity is maintained at optimal level for patient  Flowsheets (Taken 01/16/2024 0830)  Mobility/activity is maintained at optimal level for patient: Encourage independent activity per ability     Problem: Constipation  Goal: Fluid and electrolyte balance are achieved/maintained  Flowsheets (Taken 01/16/2024 0830)  Fluid and electrolyte balance are achieved/maintained: Monitor/assess lab values and report abnormal values  Goal: Mobility/Activity is maintained at optimal level for patient  Flowsheets (Taken 01/16/2024 0830)  Mobility/activity is maintained at optimal level for patient: Encourage independent activity per ability     Problem: Inadequate Gas Exchange  Goal: Patent Airway maintained  Flowsheets (Taken 01/16/2024 0830)  Patent airway maintained: Reposition patient every 2 hours and as needed unless able to self-reposition     Problem: Compromised Friction/Shear  Goal: Friction and Shear Interventions  Flowsheets (Taken 01/16/2024 0830)  Friction and Shear  Interventions: Pad bony prominences, Off load heels, HOB 30 degrees or less unless contraindicated, Consider: TAP seated positioning, Heel foams

## 2024-01-16 NOTE — Progress Notes (Signed)
 VIR PROGRESS NOTE    Date Time: 01/16/2024 7:08 PM  Patient Name: Scott Monroe, Scott Monroe.      Assessment:   72M w/ bladder mass s/p cystoscopy, TURBT, bilateral hydronephrosis s/p bilateral PCN placement.    Plan:   Bilateral PCN  - Continue drain care as per order  - routine exchange in 3 months.      Interval History:   NAEON. Pinkish urine via drainage bags. via left, via right.    Medications:   Current Facility-Administered Medications[1]      Physical Exam:     Vitals:    01/16/24 1729   BP: 150/88   Pulse: (!) 120   Resp:    Temp: 98.2 F (36.8 C)   SpO2: 98%       General: NAD  HEENT: NC/AT; PERRLA  CV: RRR  RESP: Clear  GI: S, NT, ND  Bilateral PCN in place w/ dressing c/d/I. Pinkish urine noted in the drainage bags    I & O     Intake and Output Summary (Last 24 hours) at Date Time    Intake/Output Summary (Last 24 hours) at 01/16/2024 1908  Last data filed at 01/16/2024 1700  Gross per 24 hour   Intake 768 ml   Output 2465 ml   Net -1697 ml         Labs:     Lab Results   Component Value Date/Time    WBC 6.82 01/16/2024 05:02 AM    WBC 7.70 04/21/2021 12:18 PM    HCT 28.7 (L) 01/16/2024 05:02 AM    HCT 46.4 04/21/2021 12:18 PM    INR 0.9 01/13/2024 01:56 PM    INR 1.0 04/16/2021 04:23 PM    PT 11.0 01/13/2024 01:56 PM    PT 11.6 04/16/2021 04:23 PM    PTT 29 11/09/2023 03:31 PM    PTT 27 04/16/2021 09:17 PM    BUN 30 (H) 01/15/2024 07:59 AM    BUN 16.0 04/21/2021 12:18 PM    CREAT 1.5 01/15/2024 07:59 AM    CREAT 1.1 04/21/2021 12:18 PM    GLU 138 (H) 01/15/2024 07:59 AM    GLU 287 (H) 04/21/2021 12:18 PM    K 4.6 01/15/2024 07:59 AM    K 4.3 04/21/2021 12:18 PM        Rads:   No results found.     Ennis Cramp, MD  Interventional Radiology           [1]   Current Facility-Administered Medications   Medication Dose Route Frequency    amLODIPine   5 mg Oral Daily    atorvastatin   80 mg Oral QHS    gabapentin   300 mg Oral BID    insulin  lispro  1-5 Units Subcutaneous TID AC    metoprolol  tartrate  50  mg Oral BID

## 2024-01-16 NOTE — Consults (Signed)
 Start [facility] Note  Home Health Referral    Referral from Emmie Daring (Case Manager) for home health care upon discharge.    By [employer], the patient has the right to freely choose a home care provider.    A company of the patients choosing. We have supplied the patient with a listing of providers in your area who asked to be included and participate in Medicare.   [facility] a home care agency that provides adult home care services and participates in Medicare   The preferred provider of your insurance company. Choosing a home care provider other than your insurance company's preferred provider may affect your insurance coverage.      Home Health Discharge Information    Your doctor has ordered Skilled Nursing in-home service(s) for you while you recuperate at home, to assist you in the transition from hospital to home.    The agency that you or your representative chose to provide the service:   [facility]  Phone: [phone]      The above services were set up by:  Lianne Birmingham, RN (Post Acute Care Coordinator)   Phone: [phone]      IF YOU HAVE NOT HEARD FROM YOUR HOME HEALTH AGENCY WITHIN 24-48 HOURS AFTER DISCHARGE PLEASE CALL YOUR AGENCY TO ARRANGE A TIME FOR YOUR FIRST VISIT. FOR ANY SCHEDULING CONCERNS OR QUESTIONS RELATED TO HOME HEALTH, SUCH AS TIME OR DATE PLEASE CONTACT YOUR HOME HEALTH AGENCY AT THE NUMBER LISTED ABOVE.    Additional comments:    Demographics verified, choice.    START PATIENT REGISTRATION INFORMATION     Order Information  Order Signing Physician: Ina Hosey FALCON, MD    Service Ordered RN ?: Yes  Service Ordered PT ?: No    Service Ordered OT ?: No    Service Ordered ST ?: No    Service Ordered MSW?: No    Service Ordered HHA?: No    Following Physician: Edmon Lash, MD   Following Physician Phone: [phone]   Overseeing Physician: N/A  (Required for Residents only)   Agreeable to Follow?: N/A  Spoke with: N/A  Date/Time of Call: 01/16/2024  7:16 AM      Care Coordination   SOC Call from [facility] Required?: no  Same Day [facility]?: no  Primary Care Physician:Ali Shahcheraghi, MD  Primary Care Physician [phone]  Primary Care Physician Address: [address] / [place] [address]  PCP NPI: [id number]  Visit Instructions: N/A  Service Discharge Location Type: Home  Service Facility Name: N/A  Service Floor Facility: N/A  Service Room No: N/A    Demographics  Patient Last Name: [place]   Patient First Name: Norleen  Language/Communication Barrier: no  Service Address: [address] [place] [address]   Service Home Phone: [phone] (home)   Other phone numbers:    Telephone Information:   Mobile [phone]     Emergency Contact: Extended Emergency Contact Information  Primary Emergency Contact: [facility]  Mobile Phone: [phone]  Relation: Daughter  Preferred language: English    Admission Information  Admit Date: 01/13/2024  Patient Status at discharge: Inpatient  Admitting Diagnosis: Bladder mass [N32.89]  Gross hematuria [R31.0]     Caregiver Information  Caregiver First Name: Brand Siever  Caregiver Last Name: N/A  Caregiver Relationship to Patient: Child  Caregiver Phone Number: [phone]  Caregiver Notes: N/A            Insurance Information  Primary Insurance Information  Primary Subscriber:   Primary Subscriber Relation To Guarantor:   Primary Payor:   Primary Plan:   Primary Group #:    Primary Subscriber ID:    Primary Subscriber DOB:   Secondary Insurance Information  Secondary Subscriber:   Secondary Subscriber Relation To Guarantor:   Secondary Payor:   Secondary Plan:   Secondary Group #:   Secondary Subscriber ID:   Secondary Subscriber DOB:   HITECH  NO      END PATIENT REGISTRATION INFORMATION       Diagnosis: Bladder mass [N32.89]  Gross hematuria [R31.0]    Start [facility] Summary        Additional Comments: na    End [facility] Summary     Discharge Date:  01/16/2024    Referral Source  Signed by:  Lianne Birmingham, RN  Date Time: 01/16/2024 7:16 AM      End [facility] Note       Home Address: [address]  [place] [address] Employer:  Employer Address:       ,     Main Phone: [phone] Employer Phone:     SSN: [id number]       DOB: 1945-10-01 (78 yrs)       Sex: Male Primary Care Physician: Edmon Lash, MD   Marital Status: Divorced Referring Physician:       No ref. provider found   Race: White or Caucasian       Ethnicity: Not of Hispanic/Latino/S*       Emergency Contacts  Name Home Phone Work Phone Mobile Phone Relationship BIANCA, VESTER     [phone] Daughter           Guarantor Information     Guarantor Name: ALDOUS, HOUSEL. Guarantor ID: [id number]   Guarantor Relationship to Pt: Self Guarantor Type: Personal/Family   Guarantor DOB:    1945-02-09 Billing Indicator: CO 1st Contact Attempted  CO Payment Arranged POS      Guarantor Address: [address]   [place], [place] [address]          Guarantor Home Phone: [phone] Guarantor Employer:        Guarantor Work Phone:   Pensions Consultant Emp Phone:                     Primary Insurance     Insurance Name: MEDICARE [facility] MEDICARE [employer] Subscriber Name: KAYSON, TASKER.   Insurance Address:    [address] [address]  [place], [place]  [address] Subscriber DOB: 03/16/1945      Subscriber ID: [id number]   Insurance Phone: [phone] Pt Relationship to Sub:   Self   Insurance ID:   Coverage Eff From: 07/16/2022   Group Name:   Preauthorization #:     Group #: [id number] Preauthorization Days:         Home Health face-to-face (FTF) Encounter (Order [id number])  Consult  Date: 01/16/2024 Department: [facility] [employer] [facility] Ordering/Authorizing: Ina Hosey FALCON, MD     Order Information    Order Date/Time Release Date/Time Start Date/Time End Date/Time   01/16/24 07:16 AM None 01/16/24 07:16 AM 01/16/24 07:16 AM     Order Details    Frequency Duration Priority Order Class   Once 1  occurrence Routine Hospital  Performed     Standing Order Information    Remaining Occurrences Interval Last Released     0/1 Once 01/16/2024  Provider Information    Ordering User Ordering Provider Authorizing Provider   Waddell Mow, RN Ina Hosey FALCON, MD Ina Hosey FALCON, MD   Attending Provider(s) Admitting Provider PCP   Ina Hosey FALCON, MD Ina Hosey FALCON, MD Edmon Lash, MD     Verbal Order Info    Action Created on Order Mode Entered by Responsible Provider Signed by Signed on   Ordering 01/16/24 [phone] Telephone with doyal Waddell Mow, RN Ina Hosey FALCON, MD             Comments       Primary Diagnosis:  Bladder mass [N32.89]  Gross hematuria [R31.0]    Following Physician:  Edmon Lash, MD  Address: [address] / [place] [address]  Phone: [phone]    Fax: [phone]                Home Health face-to-face (FTF) Encounter: Patient Communication     Not Released  Not seen         Order Questions    Question Answer   Date I saw the patient face-to-face: 01/16/2024   Evidence this patient is homebound because: N.  Impaired mobility d/t pain, arthritis, weakness that compromises patient safety   Medical conditions that necessitate Home Health care: A.  Post operative procedure requiring follow up care & monitoring for complication    B.  Functional impairment due to recent hospitalization/procedure/treatment    C.  Risk for complication/infection/pain requiring follow up and monitoring    D.  Chronic illness & risk for re-hospitalization due to unstable disease status    F.  New diagnosis & treatment requiring follow up monitoring and management    I.  Tube/drainage/ostomy requiring ongoing care and teaching   Per clinical findings, following services are medically necessary: Skilled Nursing   Clinical findings that support the need for Skilled Nursing. SN will: A. Educate on post operative care, restrictions & management of complications    B. Monitor post operative site for infection and educate  on care management    C. Monitor for signs and symptoms of exacerbation of disease and management    D. Review medication reconciliation, manage and educate on use and side effects    G. Educate on new diagnosis, treatment & management to prevent re-hospitalization    H. Assess cardiopulmonary status and monitor for signs &symptoms of exacerbation    I.  Educate dietary and or fluid restrictions and weight management    J.  Instruct on ongoing care and maintenance for tube/drain/ostomy                    Process Instructions    Please select Home Care Services medically necessary.    Based on the above findings, I certify that this patient is confined to the home and needs intermittent skilled nursing care, physical therapry and / or speech therapy or continues to need occupational therapy. The patient is under my care, and I have initiated the establishment of the plan of care. This patient will be followed by a physician who will periodically review the plan of care.    Collection Information            Consult Order Info    ID Description Priority Start Date Start Time   [id number] Home Health face-to-face (FTF) Encounter Routine 01/16/2024  7:16 AM   Provider Specialty Referred to   ______________________________________ _____________________________________  Verbal Order Info    Action Created on Order Mode Entered by Responsible Provider Signed by Signed on   Ordering 01/16/24 0716 Telephone with doyal Waddell Mow, RN Ina Hosey FALCON, MD             Patient Information    Patient Name  Tarick, Parenteau. Legal Sex  Male DOB  1945/03/21       Reprint Order Requisition    Home Health face-to-face (FTF) Encounter (Order #[id number]) on 01/16/24       Additional Information    Associated Reports External References   Priority and Order Details [employer]

## 2024-01-16 NOTE — Progress Notes (Signed)
 PROGRESS NOTE  [facility]  [phone]  If unreachable/after hours call [phone]    Date Time: 01/16/2024 10:02 PM  Patient Name: Scott Monroe, Scott Monroe.      Assessment:   79 y.o. M s/p cysto, TURBT, TURP and instillation of gemcitabine  01/13/24. Post op CTAP with moderate BL hydro; now s/p bilateral PCN placement 01/15/24.    Renal: Cr P  Heme: Hct 28.7  ID: WBC 6.8    Plan:   Maintain foley and bilateral PCNs to gravity drainage  Dispo planning for home with bilateral PCNs with HH  Likely remove indwelling foley catheter if urine continues to clear  Continue home meds, regular diet, SLIV, encourage OOB amublation      Subjective:   Questions about his PCNs and management, questions about bladder cancer and treatment course, questions about prognosis; gets a lot of LE L>R claudication pain with ambulation    Medications:     Current Medications[1]     Physical Exam:     Temp:  [97.6 F (36.4 C)-98.6 F (37 C)] 97.6 F (36.4 C)  Heart Rate:  [81-120] 81  Resp Rate:  [16-17] 16  BP: (117-157)/(75-95) 157/95    Intake and Output Summary (Last 24 hours) at Date Time    Intake/Output Summary (Last 24 hours) at 01/16/2024 2202  Last data filed at 01/16/2024 2145  Gross per 24 hour   Intake 768 ml   Output 2400 ml   Net -1632 ml       Output by Drain (mL) 01/14/24 0701 - 01/14/24 1900 01/14/24 1901 - 01/15/24 0700 01/15/24 0701 - 01/15/24 1900 01/15/24 1901 - 01/16/24 0700 01/16/24 0701 - 01/16/24 1900 01/16/24 1901 - 01/16/24 2202   Requested LDAs do not have output data documented.       Physical Exam:     Constitutional: Vital signs reviewed. Nontoxic and comfortable appearing male in NAD   Head: Normocephalic  Respiratory/Chest:  No respiratory distress. Nl effort.   Cardiovascular: Regular rate  GU: bilateral PCNs in place with R>L output clear, pink tinged urethral foley output  Neurological: Speech normal. GCS 15  Skin: Warm and dry  Psychiatric: Awake and alert. Normal affect.     Labs:     Results for orders  placed or performed during the hospital encounter of 01/13/24 (from the past 24 hours)   CBC with Differential (Component)    Collection Time: 01/16/24  5:02 AM   Result Value    WBC 6.82    Hemoglobin 9.5 (L)    Hematocrit 28.7 (L)    Platelet Count 171    MPV 9.8    RBC 3.32 (L)    MCV 86.4    MCH 28.6    MCHC 33.1    RDW 16 (H)    nRBC % 0.0    Absolute nRBC 0.00    Preliminary Absolute Neutrophil Count 4.13    Neutrophils % 60.5    Lymphocytes % 30.1    Monocytes % 4.3    Eosinophils % 4.1    Basophils % 0.7    Immature Granulocytes % 0.3    Absolute Neutrophils 4.13    Absolute Lymphocytes 2.05    Absolute Monocytes 0.29    Absolute Eosinophils 0.28    Absolute Basophils 0.05    Absolute Immature Granulocytes 0.02    Collection Time: 01/16/24  7:55 AM   Result Value    Whole Blood Glucose POCT 146 (H)    Collection Time: 01/16/24 12:10 PM  Result Value    Whole Blood Glucose POCT 213 (H)    Collection Time: 01/16/24  5:29 PM   Result Value    Whole Blood Glucose POCT 236 (H)         Rads:   No results found.       Additional Diagnoses:                  Acute blood loss anemia requiring monitoring     Highest Value Lowest Value Decrease   Hemoglobin 12.2 g/dl  87/70/7974  8:43 PM 9.5 g/dl  08/23/7971  4:97 AM -2.7 g/dl   Hematocrit 62.1%  87/70/7974  1:56 PM  28.7%  01/16/2024  5:02 AM -9.1%            Signed by: Donnice JINNY Plummer, MD           [1]   Current Facility-Administered Medications:     acetaminophen  (TYLENOL ) tablet 1,000 mg, 1,000 mg, Oral, Q6H PRN, Elwyn, Brittany A, NP, 1,000 mg at 01/16/24 2141    amLODIPine  (NORVASC ) tablet 5 mg, 5 mg, Oral, Daily, Zaza, Dima, NP, 5 mg at 01/16/24 0815    atorvastatin  (LIPITOR) tablet 80 mg, 80 mg, Oral, QHS, Zaza, Dima, NP, 80 mg at 01/16/24 2141    dextrose  (GLUCOSE) 40 % oral gel 15 g of glucose, 15 g of glucose, Oral, PRN **OR** dextrose  (D10W) 10% bolus 125 mL, 12.5 g, Intravenous, PRN **OR** dextrose  50 % bolus 12.5 g, 12.5 g, Intravenous, PRN **OR**  glucagon  (rDNA) (GLUCAGEN) injection 1 mg, 1 mg, Intramuscular, PRN, Plummer Donnice JINNY, MD    gabapentin  (NEURONTIN ) capsule 300 mg, 300 mg, Oral, BID, Holt, Brett F, MD, 300 mg at 01/16/24 1749    gabapentin  (NEURONTIN ) capsule 300 mg, 300 mg, Oral, Once PRN, Plummer Donnice JINNY, MD    insulin  lispro injection 1-5 Units, 1-5 Units, Subcutaneous, TID AC, 2 Units at 01/16/24 1749 **AND** NSG Communication: Glucose POCT order, , , AC, Plummer Donnice JINNY, MD    metoprolol  tartrate (LOPRESSOR ) tablet 50 mg, 50 mg, Oral, BID, Zaza, Dima, NP, 50 mg at 01/16/24 1749    ondansetron  (ZOFRAN ) injection 4 mg, 4 mg, Intravenous, Q8H PRN, Zaza, Dima, NP    oxybutynin  (DITROPAN ) tablet 5 mg, 5 mg, Oral, Q8H PRN, Zaza, Dima, NP, 5 mg at 01/13/24 1555    oxyCODONE  (ROXICODONE ) immediate release tablet 5 mg, 5 mg, Oral, Q4H PRN, Zaza, Dima, NP, 5 mg at 01/16/24 2141

## 2024-01-17 ENCOUNTER — Encounter: Payer: Self-pay | Admitting: Diagnostic Radiology

## 2024-01-17 ENCOUNTER — Other Ambulatory Visit: Payer: Self-pay

## 2024-01-17 LAB — BASIC METABOLIC PANEL
Anion Gap: 6 (ref 5.0–15.0)
BUN: 31 mg/dL — ABNORMAL HIGH (ref 9–28)
CO2: 24 meq/L (ref 17–29)
Calcium: 8.6 mg/dL (ref 7.9–10.2)
Chloride: 107 meq/L (ref 99–111)
Creatinine: 1.5 mg/dL (ref 0.5–1.5)
GFR: 48 mL/min/1.73 m2 — ABNORMAL LOW (ref >=60.0)
Glucose: 159 mg/dL — ABNORMAL HIGH (ref 70–100)
Potassium: 5.1 meq/L (ref 3.5–5.3)
Sodium: 137 meq/L (ref 135–145)

## 2024-01-17 LAB — CBC
Absolute nRBC: 0 x10 3/uL (ref <=0.00)
Hematocrit: 30.1 % — ABNORMAL LOW (ref 37.6–49.6)
Hemoglobin: 9.8 g/dL — ABNORMAL LOW (ref 12.5–17.1)
MCH: 28.6 pg (ref 25.1–33.5)
MCHC: 32.6 g/dL (ref 31.5–35.8)
MCV: 87.8 fL (ref 78.0–96.0)
MPV: 9.6 fL (ref 8.9–12.5)
Platelet Count: 175 x10 3/uL (ref 142–346)
RBC: 3.43 x10 6/uL — ABNORMAL LOW (ref 4.20–5.90)
RDW: 15 % (ref 11–15)
WBC: 5.23 x10 3/uL (ref 3.10–9.50)
nRBC %: 0 /100{WBCs} (ref <=0.0)

## 2024-01-17 LAB — WHOLE BLOOD GLUCOSE POCT: Whole Blood Glucose POCT: 165 mg/dL — ABNORMAL HIGH (ref 70–100)

## 2024-01-17 MED ORDER — GABAPENTIN 300 MG PO CAPS: 300.0000 mg | ORAL_CAPSULE | Freq: Two times a day (BID) | ORAL | Status: DC

## 2024-01-17 MED ORDER — OXYBUTYNIN CHLORIDE 5 MG PO TABS
5.0000 mg | ORAL_TABLET | Freq: Three times a day (TID) | ORAL | 0 refills | Status: AC | PRN
  Filled 2024-01-17: qty 30, 10d supply, fill #0

## 2024-01-17 NOTE — Discharge Summary (Signed)
 DISCHARGE NOTE    Date Time: 01/17/2024 8:27 AM  Patient Name: Scott Monroe, Scott Monroe.  Attending Physician: Ina Hosey FALCON, MD      Date of Admission:   01/13/2024    Date of Discharge:   01/17/2023    Reason for Admission:   Bladder mass [N32.89]  Gross hematuria [R31.0]    Discharge Dx:     1. Obstructive uropathy    2. Type 2 diabetes mellitus without complication, without long-term current use of insulin  (CMS/HCC)    3. Gross hematuria    4. Structural abnormality of bladder    5. Bladder mass    6. Status post femoral-popliteal bypass surgery         Consultations:   IR  Case management     Procedures performed:     Procedure(s):  PERC NEPH TUBE PLACEMENT    4 Days Post-Op  -------------------    Procedure(s):  PERC NEPH TUBE PLACEMENT    2 Days Post-Op  -------------------        Additional Diagnoses:               HPI:   Per Ina Hosey FALCON, MD's H&P on 01/13/2024:  Scott Monroe. is a 79 y.o. male who presents to the hospital with  recent urinary retention, hematuria, and concern for malignancy with bladder wall thickening on imaging. Scott Monroe states that the hematuria has resolved. He has recently undergone vascular bypass surgery, as well as partial toe amputation for osteomyelitis. Mr. Lucchese has smoked cigarettes since he was 14. Was previously smoking over 1 pack per day, but is now under 1 pack per day.  Here for cysto, TURBT, instillation of Gemcitabine      Hospital Course:   Patient admitted on 01/13/2024 for above procedure. Tolerated procedure well. Please see op-note for intraoperative details. Patient transferred to PACU and then surgical floor. Post op had worsening Cr with CTAP noting bilateral hydronephrosis. IR consulted and patient underwent BL PCN placement on 12/31. Patient remained hemodynamically stable throughout hospital course. Renal function improved after PCN placement (Cr 1.5 on discharge). Patient's diet advanced as tolerated with return of bowel function. Pain adequately  controlled with PO pain meds. Ambulating without issue. Patient voiding via foley catheter and bilateral PCN's Feels ready and safe for discharge POD #4.    Physical Exam:   Constitutional: Vital signs reviewed. Nontoxic and comfortable appearing male in NAD.   Head: Normocephalic, atraumatic.   Respiratory/Chest:  No respiratory distress. Nl effort.   Cardiovascular: Regular rate  Abdomen: Soft, appropriately tender, nondistended.  Flank: BL PCNS draining clear peach colored urine  GU:  foley catheter draining peach colored urine   Neurological: Speech normal. GCS 15  Skin: Warm and dry  Psychiatric: Awake and alert. Normal affect.     Discharge Medications:        Medication List        START taking these medications      normal saline flush 0.9 % Soln injection  Flush drain every other day as directed     oxybutynin  5 MG tablet  Commonly known as: DITROPAN   Take 1 tablet (5 mg) by mouth every 8 (eight) hours as needed (bladder spasms)            CHANGE how you take these medications      gabapentin  300 MG capsule  Commonly known as: NEURONTIN   Take 1 capsule (300 mg) by mouth 2 (two) times daily Follow up with  prescriber to discuss dosage based on kidney function  What changed:   how much to take  when to take this  additional instructions            CONTINUE taking these medications      amLODIPine  5 MG tablet  Commonly known as: NORVASC   Take 1 tablet (5 mg) by mouth once daily Pt ran out of this medication     apixaban  2.5 MG tablet  Commonly known as: ELIQUIS   Take 1 tablet (2.5 mg) by mouth every 12 (twelve) hours     aspirin  EC 81 MG EC tablet     atorvastatin  80 MG tablet  Commonly known as: LIPITOR  Take 1 tablet (80 mg) by mouth nightly     FreeStyle Libre 3 Reader Devi  Use 4 times daily - with meals and at bedtime     FreeStyle Libre 3 Sensor Misc  Use 1 Application every 14 (fourteen) days     metFORMIN  1000 MG tablet  Commonly known as: GLUCOPHAGE      metoprolol  tartrate 50 MG tablet  Commonly known  as: LOPRESSOR   Take 1 tablet (50 mg) by mouth 2 (two) times daily Hold for blood pressure lower than 100/60               Where to Get Your Medications        These medications were sent to [facility]  [address], [place] [address]      Hours: M-Su:8AM-8PM Phone: [phone]   normal saline flush 0.9 % Soln injection  oxybutynin  5 MG tablet       Information about where to get these medications is not yet available    Ask your nurse or doctor about these medications  gabapentin  300 MG capsule         Disposition:   Home     Discharge Instructions:   Activity: activity as tolerated   Diet: diabetic diet  Wound Care: PCN care per IR instructions     Follow Up:   Ina Hosey FALCON, MD  [address]  [place] [address]  [phone]    Follow up      [facility]  [address]  [place]  [phone]  [phone]  Call  Please call to schedule your bilateral nephrostomy tube check and change in 6-8 weeks [phone]      Signed by: Laymon DELENA Archer, NP  Laymon DELENA Archer, NP  Laymon Archer, NP  [facility]  [phone]  [facility]  [phone]   IF unreachable/after hours call [phone]

## 2024-01-17 NOTE — Progress Notes (Signed)
 Case management discharge note   Case Management location: onsite    Patient is medically clear to be discharged home and patient agreed to be discharged home today.      Dispo:Home with Home health SN Home with family   Transport  Dtr Cailen Mihalik in room and will transport home            01/17/24 [phone]   Medicare Checklist   Is this a Medicare patient? Yes   Patient received 1st IMM Letter? Yes   3 midnight inpatient qualifying stay (SNF only) No   If LOS 3 days or greater, did patient received 2nd IMM Letter? n/a   Discharge Disposition   Patient preference/choice provided? Yes   Physical Discharge Disposition Home, Home Health   Name of Home Health Agency Placement [facility]   Mode of Transportation Car   Patient/Family/POA notified of transfer plan Yes   Patient agreeable to discharge plan/expected d/c date? Yes   Family/POA agreeable to discharge plan/expected d/c date? Yes   Bedside nurse notified of transport plan? Yes   CM Interventions   Follow up appointment scheduled?(For PNA, COPD, MI) No   Reason no follow up scheduled? Family to schedule   Referral made for home health RN visit? Yes     Emmie Daring RN   Case manager   [phone]

## 2024-01-17 NOTE — Plan of Care (Signed)
 Problem: Moderate/High Fall Risk Score >5  Goal: Patient will remain free of falls  Outcome: Progressing  Flowsheets (Taken 01/17/2024 0822)  Moderate Risk (6-13):   MOD-Apply bed exit alarm if patient is confused   MOD-Floor mat at bedside (where available) if appropriate   MOD-Perform dangle, stand, walk (DSW) prior to mobilization     Problem: Pain interferes with ability to perform ADL  Goal: Pain at adequate level as identified by patient  Outcome: Progressing  Flowsheets (Taken 01/17/2024 0942)  Pain at adequate level as identified by patient:   Include patient/patient care companion in decisions related to pain management as needed   Offer non-pharmacological pain management interventions   Evaluate patient's satisfaction with pain management progress   Evaluate if patient comfort function goal is met   Reassess pain within 30-60 minutes of any procedure/intervention, per Pain Assessment, Intervention, Reassessment (AIR) Cycle   Identify patient comfort function goal   Assess for risk of opioid induced respiratory depression, including snoring/sleep apnea. Alert healthcare team of risk factors identified.   Assess pain on admission, during daily assessment and/or before any as needed intervention(s)     Problem: Side Effects from Pain Analgesia  Goal: Patient will experience minimal side effects of analgesic therapy  Outcome: Progressing  Flowsheets (Taken 01/17/2024 [phone])  Patient will experience minimal side effects of analgesic therapy:   Monitor/assess patient's respiratory status (RR depth, effort, breath sounds)   Assess for changes in cognitive function   Prevent/manage side effects per LIP orders (i.e. nausea, vomiting, pruritus, constipation, urinary retention, etc.)   Evaluate for opioid-induced sedation with appropriate assessment tool (i.e. POSS)     Problem: Bladder/Voiding  Goal: Patient will experience proper bladder emptying during admission and remain free from infection  Outcome:  Progressing  Flowsheets (Taken 01/17/2024 [phone])  Patient will experience proper bladder emptying during admission and remain free from infection:   Encourage patient to identify medications that aid bladder function prior to administration   Apply urinary containment device as appropriate and/or per order   Assess need for indwelling catheter every shift and discuss with LIP     Problem: Inadequate Airway Clearance  Goal: Patent Airway maintained  Outcome: Progressing  Flowsheets (Taken 01/17/2024 0942)  Patent airway maintained:   Position patient for maximum ventilatory efficiency   Suction secretions as needed   Reinforce use of ordered respiratory interventions (i.e. CPAP, BiPAP, Incentive Spirometer, Acapella, etc.)   Reposition patient every 2 hours and as needed unless able to self-reposition   Provide adequate fluid intake to liquefy secretions  Goal: Normal respiratory rate/effort achieved/maintained  Outcome: Progressing  Flowsheets (Taken 01/17/2024 0942)  Normal respiratory rate/effort achieved/maintained: Plan activities to conserve energy: plan rest periods     Problem: Infection Prevention  Goal: Free from infection  Outcome: Progressing  Flowsheets (Taken 01/17/2024 0942)  Free from infection:   Monitor/assess vital signs   Assess incision for evidence of healing   Monitor/assess output from surgical drain if present   Monitor/assess lab values and report abnormal values   Assess for signs and symptoms of infection   Encourage/assist patient to turn, cough and perform deep breathing every 2 hours     Problem: Impaired Mobility  Goal: Mobility/Activity is maintained at optimal level for patient  Outcome: Progressing  Flowsheets (Taken 01/17/2024 0942)  Mobility/activity is maintained at optimal level for patient: Encourage independent activity per ability     Problem: Constipation  Goal: Fluid and electrolyte balance are achieved/maintained  Outcome: Progressing  Flowsheets (Taken 01/17/2024 [phone])  Fluid and  electrolyte balance are achieved/maintained:   Monitor/assess lab values and report abnormal values   Assess and reassess fluid and electrolyte status   Monitor for muscle weakness  Goal: Elimination patterns are normal or improving  Outcome: Progressing  Goal: Nutritional intake is adequate  Outcome: Progressing  Goal: Mobility/Activity is maintained at optimal level for patient  Outcome: Progressing  Flowsheets (Taken 01/17/2024 0942)  Mobility/activity is maintained at optimal level for patient: Encourage independent activity per ability     Problem: Inadequate Gas Exchange  Goal: Patent Airway maintained  Outcome: Progressing  Flowsheets (Taken 01/17/2024 0942)  Patent airway maintained:   Position patient for maximum ventilatory efficiency   Suction secretions as needed   Reinforce use of ordered respiratory interventions (i.e. CPAP, BiPAP, Incentive Spirometer, Acapella, etc.)   Reposition patient every 2 hours and as needed unless able to self-reposition   Provide adequate fluid intake to liquefy secretions  Goal: Adequate oxygenation and improved ventilation  Outcome: Progressing  Flowsheets (Taken 01/17/2024 0942)  Adequate oxygenation and improved ventilation:   Assess lung sounds   Monitor SpO2 and treat as needed   Provide mechanical and oxygen  support to facilitate gas exchange   Teach/reinforce use of incentive spirometer 10 times per hour while awake, cough and deep breath as needed   Plan activities to conserve energy: plan rest periods   Increase activity as tolerated/progressive mobility     Problem: Artificial Airway  Goal: Endotracheal tube will be maintained  Outcome: Progressing  Goal: 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  Outcome: Progressing     Problem: Compromised Sensory Perception  Goal: Sensory Perception Interventions  Outcome: Progressing  Flowsheets (Taken 01/17/2024 0942)  Sensory Perception Interventions: Offload heels, Pad bony prominences, Reposition q 2hrs/turn Clock, Q2 hour skin assessment  under devices if present     Problem: Compromised Moisture  Goal: Moisture level Interventions  Outcome: Progressing  Flowsheets (Taken 01/17/2024 0942)  Moisture level Interventions: Moisture wicking products, Moisture barrier cream     Problem: Compromised Activity/Mobility  Goal: Activity/Mobility Interventions  Outcome: Progressing  Flowsheets (Taken 01/17/2024 0942)  Activity/Mobility Interventions: Pad bony prominences, TAP Seated positioning system when OOB, Promote PMP, Reposition q 2 hrs / turn clock, Offload heels     Problem: Compromised Friction/Shear  Goal: Friction and Shear Interventions  Outcome: Progressing  Flowsheets (Taken 01/17/2024 0942)  Friction and Shear Interventions: Pad bony prominences, Off load heels, HOB 30 degrees or less unless contraindicated, Consider: TAP seated positioning, Heel foams     Problem: Compromised Nutrition  Goal: Nutrition Interventions  Outcome: Progressing  Flowsheets (Taken 01/17/2024 0942)  Nutrition Interventions: Discuss nutrition at Rounds, I&Os, Document % meal eaten, Daily weights     Problem: Safety  Goal: Patient will be free from injury during hospitalization  Outcome: Progressing  Flowsheets (Taken 01/17/2024 9057)  Patient will be free from injury during hospitalization:   Use appropriate transfer methods   Ensure appropriate safety devices are available at the bedside   Provide and maintain safe environment   Assess patient's risk for falls and implement fall prevention plan of care per policy   Hourly rounding   Include patient/ family/ care giver in decisions related to safety   Assess for patients risk for elopement and implement Elopement Risk Plan per policy   Provide alternative method of communication if needed (communication boards, writing)  Goal: Patient will be free from infection during hospitalization  Outcome: Progressing  Flowsheets (Taken 01/17/2024 0942)  Free from Infection during hospitalization:   Monitor lab/diagnostic results   Assess and  monitor for signs and symptoms of infection   Monitor all insertion sites (i.e. indwelling lines, tubes, urinary catheters, and drains)   Encourage patient and family to use good hand hygiene technique     Problem: Discharge Barriers  Goal: Patient will be discharged home or other facility with appropriate resources  Outcome: Progressing  Flowsheets (Taken 01/17/2024 0942)  Discharge to home or other facility with appropriate resources:   Provide appropriate patient education   Provide information on available health resources   Initiate discharge planning     Problem: Psychosocial and Spiritual Needs  Goal: Demonstrates ability to cope with hospitalization/illness  Outcome: Progressing  Flowsheets (Taken 01/17/2024 0942)  Demonstrates ability to cope with hospitalizations/illness:   Encourage verbalization of feelings/concerns/expectations   Provide quiet environment   Include patient/ patient care companion in decisions     Problem: Fluid and Electrolyte Imbalance/ Endocrine  Goal: Adequate hydration  Outcome: Progressing  Flowsheets (Taken 01/17/2024 0942)  Adequate hydration:   Assess mucus membranes, skin color, turgor, perfusion and presence of edema   Assess for peripheral, sacral, periorbital and abdominal edema   Monitor and assess vital signs and perfusion     Problem: Nutrition  Goal: Food and/or nutrient delivery  Outcome: Progressing  Flowsheets (Taken 01/17/2024 0942)  Food and/or nutrient delivery: Provide a pleasant environment during mealtime     Problem: Diabetes: Glucose Imbalance  Goal: Blood glucose stable at established goal  Outcome: Progressing  Flowsheets (Taken 01/17/2024 0942)  Blood glucose stable at established goal:   Monitor lab values   Monitor intake and output.  Notify LIP if urine output is < 30 mL/hour.   Follow fluid restrictions/IV/PO parameters   Include patient/family in decisions related to nutrition/dietary selections   Monitor/assess vital signs   Assess for hypoglycemia  /hyperglycemia   Coordinate medication administration with meals, as indicated   Ensure appropriate diet and assess tolerance   Ensure adequate hydration   Ensure patient/family has adequate teaching materials   Ensure appropriate consults are obtained (Nutrition, Diabetes Education, and Case Management)

## 2024-01-17 NOTE — Plan of Care (Signed)
 PROGRESS NOTE:    Vitals: Occasionally hypertensive, otherwise VSS.   Neuro: A&O x4. 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 Pt presents as fatigued.   Respiratory: Regular, unlabored respiration. Clear breath sounds bilaterally. Denies SOB. RA.   Cardiac: PMH of A-fib. Radial and pedal pulses +2 bilaterally. Denies chest pain.   GI: Continent; last BM 1/01. Rounded abdomen with mild tenderness to BL lower quadrants. Flatus (+). Pt endorses bloating. CC diet. AC.   GU: Presence of urinary catheter and x2 PCN to BL flank.   Skin: Friable skin. Scattered abrasions, bruising, and scars. Cracking to BL heels. Blanchable redness to back and BL heels. Surgical incisions - x2 PCN to BL flank.     Musculoskeletal: Full ROM of all extremities with generalized weakness. 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 Limited movement to upper/lower back.   Pain: Mild to severe pain reported throughout shift; managed with PRN and scheduled medications.   Mobility: Stand by assist + walker.   LDA/Drips: PIV - R hand (22 g.); saline locked. Urethral catheter (28fr.). Nephrostomy (10.2 fr.) to BL flank.     Fall Score/Safety: Moderate Fall Risk Score - Safety Precautions Implemented  Shift Events: RN contacted Urology call service per pt request, as pt's neuropathy was uncontrolled. Dr. Erminio ordered one time dose of gabapentin  (300 mg) to alleviate symptoms and ordered AM labs.   Plan: Pain/nausea management. Foley care; irrigate by hand PRN. Drain care. Strict I/Os. CC diet. AC; target glucose goal 140-180 mg/dL. Encourage IS use + SCDs + activity/ambulation as tolerated.     Problem: Moderate/High Fall Risk Score >5  Goal: Patient will remain free of falls  Outcome: Progressing  Flowsheets (Taken 01/16/2024 1920)  Moderate Risk (6-13):   MOD-Consider activation of bed alarm if appropriate   MOD-Apply bed exit alarm if patient is confused   MOD-Floor mat at bedside (where available) if appropriate   MOD-Re-orient confused patients   MOD-Utilize diversion  activities   MOD-Perform dangle, stand, walk (DSW) prior to mobilization   MOD-include family in multidisciplinary POC discussions     Problem: Pain interferes with ability to perform ADL  Goal: Pain at adequate level as identified by patient  Outcome: Progressing  Flowsheets (Taken 01/17/2024 0128)  Pain at adequate level as identified by patient:   Identify patient comfort function goal   Assess for risk of opioid induced respiratory depression, including snoring/sleep apnea. Alert healthcare team of risk factors identified.   Assess pain on admission, during daily assessment and/or before any as needed intervention(s)   Reassess pain within 30-60 minutes of any procedure/intervention, per Pain Assessment, Intervention, Reassessment (AIR) Cycle   Evaluate if patient comfort function goal is met   Evaluate patient's satisfaction with pain management progress   Offer non-pharmacological pain management interventions   Include patient/patient care companion in decisions related to pain management as needed     Problem: Side Effects from Pain Analgesia  Goal: Patient will experience minimal side effects of analgesic therapy  Outcome: Progressing  Flowsheets (Taken 01/17/2024 0128)  Patient will experience minimal side effects of analgesic therapy:   Monitor/assess patient's respiratory status (RR depth, effort, breath sounds)   Assess for changes in cognitive function   Prevent/manage side effects per LIP orders (i.e. nausea, vomiting, pruritus, constipation, urinary retention, etc.)   Evaluate for opioid-induced sedation with appropriate assessment tool (i.e. POSS)     Problem: Bladder/Voiding  Goal: Patient will experience proper bladder emptying during admission and remain free from infection  Outcome: Progressing  Flowsheets (Taken 01/17/2024 0128)  Patient will experience proper bladder emptying during admission and remain free from infection:   Apply urinary containment device as appropriate and/or per order    Utilize bladder scans prior to or post void as appropriate   Encourage patient to identify medications that aid bladder function prior to administration   Assess need for indwelling catheter every shift and discuss with LIP   Encourage bladder emptying at regular intervals     Problem: Inadequate Airway Clearance  Goal: Patent Airway maintained  Outcome: Progressing  Flowsheets (Taken 01/17/2024 0128)  Patent airway maintained:   Position patient for maximum ventilatory efficiency   Provide adequate fluid intake to liquefy secretions   Suction secretions as needed   Reinforce use of ordered respiratory interventions (i.e. CPAP, BiPAP, Incentive Spirometer, Acapella, etc.)   Reposition patient every 2 hours and as needed unless able to self-reposition  Goal: Normal respiratory rate/effort achieved/maintained  Outcome: Progressing  Flowsheets (Taken 01/17/2024 0128)  Normal respiratory rate/effort achieved/maintained: Plan activities to conserve energy: plan rest periods     Problem: Infection Prevention  Goal: Free from infection  Outcome: Progressing  Flowsheets (Taken 01/17/2024 0128)  Free from infection:   Monitor/assess vital signs   Encourage/assist patient to turn, cough and perform deep breathing every 2 hours   Assess incision for evidence of healing   Monitor/assess output from surgical drain if present   Assess for signs and symptoms of infection   Monitor/assess lab values and report abnormal values     Problem: Impaired Mobility  Goal: Mobility/Activity is maintained at optimal level for patient  Outcome: Progressing  Flowsheets (Taken 01/17/2024 0128)  Mobility/activity is maintained at optimal level for patient: Encourage independent activity per ability     Problem: Constipation  Goal: Fluid and electrolyte balance are achieved/maintained  Outcome: Progressing  Flowsheets (Taken 01/17/2024 0128)  Fluid and electrolyte balance are achieved/maintained:   Monitor/assess lab values and report abnormal values    Assess and reassess fluid and electrolyte status   Observe for cardiac arrhythmias   Monitor for muscle weakness  Goal: Elimination patterns are normal or improving  Outcome: Progressing  Goal: Nutritional intake is adequate  Outcome: Progressing  Goal: Mobility/Activity is maintained at optimal level for patient  Outcome: Progressing  Flowsheets (Taken 01/17/2024 0128)  Mobility/activity is maintained at optimal level for patient: Encourage independent activity per ability     Problem: Inadequate Gas Exchange  Goal: Patent Airway maintained  Outcome: Progressing  Flowsheets (Taken 01/17/2024 0128)  Patent airway maintained:   Position patient for maximum ventilatory efficiency   Provide adequate fluid intake to liquefy secretions   Suction secretions as needed   Reinforce use of ordered respiratory interventions (i.e. CPAP, BiPAP, Incentive Spirometer, Acapella, etc.)   Reposition patient every 2 hours and as needed unless able to self-reposition  Goal: Adequate oxygenation and improved ventilation  Outcome: Progressing  Flowsheets (Taken 01/17/2024 0128)  Adequate oxygenation and improved ventilation:   Assess lung sounds   Monitor SpO2 and treat as needed   Provide mechanical and oxygen  support to facilitate gas exchange   Position for maximum ventilatory efficiency   Teach/reinforce use of incentive spirometer 10 times per hour while awake, cough and deep breath as needed   Plan activities to conserve energy: plan rest periods   Increase activity as tolerated/progressive mobility     Problem: Artificial Airway  Goal: Endotracheal tube will be maintained  Outcome: Progressing  Flowsheets (Taken 01/17/2024 0128)  Endotracheal  tube will be maintained:   Keep head of bed at 30 degrees, unless contraindicated   Encourage/perform oral hygiene as appropriate  Goal: 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  Outcome: Progressing  Flowsheets (Taken 01/17/2024 0128)  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:   Keep head of bed at 30 degrees, unless  contraindicated   Encourage/perform oral hygiene as appropriate     Problem: Compromised Sensory Perception  Goal: Sensory Perception Interventions  Outcome: Progressing  Flowsheets (Taken 01/17/2024 0128)  Sensory Perception Interventions: Offload heels, Pad bony prominences, Reposition q 2hrs/turn Clock, Q2 hour skin assessment under devices if present     Problem: Compromised Moisture  Goal: Moisture level Interventions  Outcome: Progressing  Flowsheets (Taken 01/17/2024 0128)  Moisture level Interventions: Moisture wicking products, Moisture barrier cream     Problem: Compromised Activity/Mobility  Goal: Activity/Mobility Interventions  Outcome: Progressing  Flowsheets (Taken 01/17/2024 0128)  Activity/Mobility Interventions: Pad bony prominences, TAP Seated positioning system when OOB, Promote PMP, Reposition q 2 hrs / turn clock, Offload heels     Problem: Compromised Friction/Shear  Goal: Friction and Shear Interventions  Outcome: Progressing  Flowsheets (Taken 01/17/2024 0128)  Friction and Shear Interventions: Pad bony prominences, Off load heels, HOB 30 degrees or less unless contraindicated, Consider: TAP seated positioning, Heel foams     Problem: Compromised Nutrition  Goal: Nutrition Interventions  Outcome: Progressing  Flowsheets (Taken 01/17/2024 0128)  Nutrition Interventions: Discuss nutrition at Rounds, I&Os, Document % meal eaten, Daily weights     Problem: Safety  Goal: Patient will be free from injury during hospitalization  Outcome: Progressing  Flowsheets (Taken 01/17/2024 0128)  Patient will be free from injury during hospitalization:   Assess patient's risk for falls and implement fall prevention plan of care per policy   Provide and maintain safe environment   Use appropriate transfer methods   Ensure appropriate safety devices are available at the bedside   Include patient/ family/ care giver in decisions related to safety   Hourly rounding   Assess for patients risk for elopement and implement  Elopement Risk Plan per policy   Provide alternative method of communication if needed (communication boards, writing)  Goal: Patient will be free from infection during hospitalization  Outcome: Progressing  Flowsheets (Taken 01/17/2024 0128)  Free from Infection during hospitalization:   Assess and monitor for signs and symptoms of infection   Monitor lab/diagnostic results   Monitor all insertion sites (i.e. indwelling lines, tubes, urinary catheters, and drains)   Encourage patient and family to use good hand hygiene technique     Problem: Discharge Barriers  Goal: Patient will be discharged home or other facility with appropriate resources  Outcome: Progressing  Flowsheets (Taken 01/17/2024 0128)  Discharge to home or other facility with appropriate resources: Provide appropriate patient education     Problem: Psychosocial and Spiritual Needs  Goal: Demonstrates ability to cope with hospitalization/illness  Outcome: Progressing  Flowsheets (Taken 01/17/2024 0128)  Demonstrates ability to cope with hospitalizations/illness:   Encourage verbalization of feelings/concerns/expectations   Provide quiet environment   Assist patient to identify own strengths and abilities   Encourage patient to set small goals for self   Encourage participation in diversional activity   Reinforce positive adaptation of new coping behaviors   Include patient/ patient care companion in decisions     Problem: Fluid and Electrolyte Imbalance/ Endocrine  Goal: Adequate hydration  Outcome: Progressing  Flowsheets (Taken 01/17/2024 0128)  Adequate hydration:   Assess mucus membranes, skin color, turgor, perfusion and  presence of edema   Assess for peripheral, sacral, periorbital and abdominal edema   Monitor and assess vital signs and perfusion     Problem: Nutrition  Goal: Food and/or nutrient delivery  Outcome: Progressing  Flowsheets (Taken 01/17/2024 0128)  Food and/or nutrient delivery: Provide a pleasant environment during mealtime     Problem:  Diabetes: Glucose Imbalance  Goal: Blood glucose stable at established goal  Outcome: Progressing  Flowsheets (Taken 01/17/2024 0128)  Blood glucose stable at established goal:   Monitor lab values   Monitor intake and output.  Notify LIP if urine output is < 30 mL/hour.   Follow fluid restrictions/IV/PO parameters   Include patient/family in decisions related to nutrition/dietary selections   Assess for hypoglycemia /hyperglycemia   Monitor/assess vital signs   Coordinate medication administration with meals, as indicated   Ensure appropriate diet and assess tolerance   Ensure adequate hydration   Ensure appropriate consults are obtained (Nutrition, Diabetes Education, and Case Management)   Ensure patient/family has adequate teaching materials

## 2024-01-17 NOTE — Nursing Progress Note (Signed)
 Tatum PA notified @0757 , BP elevated 164/82. No new orders.

## 2024-01-17 NOTE — Discharge Summary -  Nursing (Signed)
 Patient discharged to home.  AVS reviewed. All questions answered.   PIV removed, catheter intact.  Neph tube teaching reviewed with patient and daughter at bedside. Demonstrated drain flushes and explained dressing changes. All questions answered. All supplies sent with patient.   Foley care teaching reviewed with patient. Changing bags, emptying bags, and cleansing of the cathter reviewed. All questions answered. All supplies sent with patient.   Patient left with prescribed medications.  Patient left at approximately 0920 with all belongings.  Patient left in stable condition.

## 2024-01-21 LAB — SURGICAL PATHOLOGY

## 2024-01-28 ENCOUNTER — Encounter (INDEPENDENT_AMBULATORY_CARE_PROVIDER_SITE_OTHER): Payer: Self-pay | Admitting: Urology

## 2024-01-28 ENCOUNTER — Encounter (INDEPENDENT_AMBULATORY_CARE_PROVIDER_SITE_OTHER): Payer: Self-pay | Admitting: Internal Medicine

## 2024-01-28 ENCOUNTER — Ambulatory Visit (INDEPENDENT_AMBULATORY_CARE_PROVIDER_SITE_OTHER): Admitting: Internal Medicine

## 2024-01-28 VITALS — BP 117/81 | HR 148 | Temp 97.0°F | Ht 68.0 in | Wt 128.0 lb

## 2024-01-28 DIAGNOSIS — R310 Gross hematuria: Secondary | ICD-10-CM

## 2024-01-28 DIAGNOSIS — I739 Peripheral vascular disease, unspecified: Secondary | ICD-10-CM

## 2024-01-28 DIAGNOSIS — G629 Polyneuropathy, unspecified: Secondary | ICD-10-CM

## 2024-01-28 MED ORDER — GABAPENTIN 300 MG PO CAPS: 300.0000 mg | ORAL_CAPSULE | Freq: Three times a day (TID) | ORAL | 1 refills | Status: AC

## 2024-01-28 NOTE — Progress Notes (Signed)
 Subjective:      Date: 01/28/2024 1:35 PM   Patient ID: Scott Monroe. is a 79 y.o. male.    Chief Complaint:  Chief Complaint   Patient presents with    Post-op Problem    Medication Refill     Adjust dosage and refill request        HPI:  79 year old male with history of CHF, A-fib peripheral vascular disease, hypertension, and recent diagnosis of bladder CA here for follow-up he underwent percutaneous nephrostomy tube placement to relieve obstruction due to the bladder mass.  He reports no has been having some pain in his continues to note blood on in the Foley.  Previously prescribed gabapentin  to help with his pain and is wondering if he can get a refill on it.      Current Medications:  Medications Taking[1]    Allergies:  Allergies[2]    Past Medical History:  Medical History[3]    Past Surgical History:  Past Surgical History[4]    Family History:  Family History[5]    Social History:  Social History[6]       The following sections were reviewed this encounter by the provider:          ROS:  Review of Systems     Objective:   Vitals:  BP 117/81 (BP Site: Right arm, Patient Position: Sitting, Cuff Size: Small)   Pulse (!) 148   Temp 97 F (36.1 C) (Oral)   Ht 1.727 m (5' 8)   Wt 58.1 kg (128 lb)   SpO2 100%   BMI 19.46 kg/m       Physical Exam:  General Examination:   GENERAL APPEARANCE: alert, in no acute distress, well developed, well nourished, oriented to time, place, and person.   HEAD: normal appearance, atraumatic.   EYES: extraocular movement intact (EOMI), pupils equal, round, reactive to light and accommodation, sclera anicteric, conjunctiva clear.   EARS: tympanic membranes normal bilaterally, external canals normal .   NOSE: normal nasal mucosa, no lesions.   ORAL CAVITY: normal oropharynx, normal lips, mucosa moist, no lesions.    NECK: neck supple,no cervical lymphadenopathy, no neck mass palpated, no jugular venous distention, no thyromegaly.    HEART: S1, S2 normal, no murmurs,  rubs, gallops, regular rate and rhythm.   LUNGS: normal effort / no distress, normal breath sounds, clear to auscultation bilaterally, no wheezes, rales, rhonchi.   ABDOMEN: bowel sounds present, no hepatosplenomegaly, soft, nontender, nondistended. 2 nephrostomy tube in place (clear bag), foley with hematuria   EXTREMITIES: no edema, no clubbing, cyanosis, or edema. Pulse 2+ B/L  MUSCULOSKELETAL: full range of motion, no swelling or deformity.    NEUROLOGIC: nonfocal, cranial nerves 2-12 grossly intact, deep tendon reflexes 2+ symmetrical, normal strength, tone and reflexes, sensory exam intact.   PSYCH: cognitive function intact, mood/affect full range, speech clear.     Assessment/plan:       1. Neuropathy  Has been on twice daily gabapentin  with no complete relief will increase to 3 times daily dosing.  - gabapentin  (NEURONTIN ) 300 MG capsule; Take 1 capsule (300 mg) by mouth 3 (three) times daily Follow up with prescriber to discuss dosage based on kidney function  Dispense: 270 capsule; Refill: 1    2. PVD (peripheral vascular disease)    3. Gross hematuria  Advised to follow-up with the urologist for further assessment        Follow-up:   No follow-ups on file.  Hildegard Scarce, MD,  FACP        [1]   Outpatient Medications Marked as Taking for the 01/28/24 encounter (Office Visit) with Scarce Hildegard, MD   Medication Sig Dispense Refill    amLODIPine  (NORVASC ) 5 MG tablet Take 1 tablet (5 mg) by mouth once daily Pt ran out of this medication 90 tablet 3    apixaban  (ELIQUIS ) 2.5 MG tablet Take 1 tablet (2.5 mg) by mouth every 12 (twelve) hours 60 tablet 0    aspirin  EC 81 MG EC tablet Take 1 tablet (81 mg) by mouth once daily      atorvastatin  (LIPITOR) 80 MG tablet Take 1 tablet (80 mg) by mouth nightly 30 tablet 0    Continuous Glucose Receiver (FreeStyle Libre 3 Reader) Device Use 4 times daily - with meals and at bedtime 1 each 1    Continuous Glucose Sensor (FreeStyle Libre 3 Sensor) Misc Use 1  Application every 14 (fourteen) days 6 each 2    metFORMIN  (GLUCOPHAGE ) 1000 MG tablet Take 1 tablet (1,000 mg) by mouth 2 (two) times daily with meals      metoprolol  tartrate (LOPRESSOR ) 50 MG tablet Take 1 tablet (50 mg) by mouth 2 (two) times daily Hold for blood pressure lower than 100/60 180 tablet 3    oxybutynin  (DITROPAN ) 5 MG tablet Take 1 tablet (5 mg) by mouth every 8 (eight) hours as needed (bladder spasms) 30 tablet 0    Sodium Chloride  Flush (normal saline flush) 0.9 % Solution injection Flush drain every other day as directed 300 mL 3    [DISCONTINUED] gabapentin  (NEURONTIN ) 300 MG capsule Take 1 capsule (300 mg) by mouth 2 (two) times daily Follow up with prescriber to discuss dosage based on kidney function     [2]   Allergies  Allergen Reactions    Strawberry C [Ascorbate] Hives    Strawberry Extract Hives    Lidocaine       Other Reaction(s): Headache/ Heart Palpitations   [3]   Past Medical History:  Diagnosis Date    Hyperlipidemia     Hypertension     Insomnia     Irregular heart beat     Restless leg     Sleep apnea    [4]   Past Surgical History:  Procedure Laterality Date    ARTERIAL-  LOWER EXTREMITY ANGIOGRAPHY POSS PTA Right 11/09/2023    Procedure: Arterial-  Lower Extremity Angiography Poss PTA;  Surgeon: Marinus Longest, MD;  Location: [facility];  Service: Cardiovascular;  Laterality: Right;    ARTERIAL-  LOWER EXTREMITY ANGIOGRAPHY POSS PTA N/A 11/11/2023    Procedure: Arterial-  Lower Extremity Angiography Poss PTA;  Surgeon: Marinus Longest, MD;  Location: [facility];  Service: Cardiovascular;  Laterality: N/A;    BACK SURGERY  2016    CYSTOSCOPY, TRANSURETHAL RESECTION, BLADDER TUMOR N/A 01/13/2024    Procedure: CYSTOSCOPY, TRANSURETHAL RESECTION, BLADDER NEOPLASM, GEMCITABINE , TURP;  Surgeon: Ina Hosey FALCON, MD;  Location: [facility];  Service: Urology;  Laterality: N/A;    FEMORAL-POPLITEAL BYPASS Right 11/12/2023    Procedure: CREATION, BYPASS, ARTERIAL,  FEMORAL TO POSTERIOR POPLITEAL; REVERSE SAPHENOUS VEIN BYPASS;  Surgeon: Marinus Longest, MD;  Location: [facility];  Service: Vascular;  Laterality: Right;  10/28-CARD SENT    PERC NEPH TUBE PLACEMENT Bilateral 01/15/2024    Procedure: PERC NEPH TUBE PLACEMENT;  Surgeon: Rosine Camellia GRADE, MD;  Location: [facility];  Service: Interventional Radiology;  Laterality: Bilateral;  SPINAL CORD STIMULATOR IMPLANT  2016   [5]   Family History  Problem Relation Name Age of Onset    Diabetes Mother      Liver cancer Mother      Cirrhosis Father      Diabetes Father      Brain cancer Sister      Heart attack Sister      Kidney failure Brother     [6]   Social History  Tobacco Use    Smoking status: Every Day     Current packs/day: 1.00     Average packs/day: 1 pack/day for 64.0 years (64.0 ttl pk-yrs)     Types: Cigarettes     Start date: 1962    Smokeless tobacco: Never   Vaping Use    Vaping status: Never Used   Substance Use Topics    Alcohol use: Never    Drug use: Never

## 2024-01-30 NOTE — Progress Notes (Signed)
 Name: Neven Fina      ### Patient Details  Date of Birth: 11/19/1945  MRN: [id number]      ### Encounter Details  Arrival Date: 01/13/2024 08:49 AM EST  Discharge Date: 01/17/2024 09:59 AM EST  Encounter ID: [id number] 09:59:00    ### Related interaction  [place] - TCM V2 Post Discharge Outreach (Post Discharge TCM V2 Outreach 1) ([email or web address])        ### Required Interventions and Feedback     Call Status         Call Status:     No Attempt (edited by TF on 01/30/2024 11:58 AM EST)    Comments::     Patient received an automated post-discharge outreach call. Hangup (1st attempt), Incomplete (2nd attempt), Hangup (3rd attempt). Unable to reach the patient, no further action/outreach needed at this time. (edited by TF on 01/30/2024 12:00 PM EST)    Verneita Leverne) F.  Care Coordinator, Ambulatory Care Management  [facility]   [address] [place], [place] [address]

## 2024-02-04 ENCOUNTER — Ambulatory Visit (INDEPENDENT_AMBULATORY_CARE_PROVIDER_SITE_OTHER): Admitting: Internal Medicine

## 2024-02-05 ENCOUNTER — Other Ambulatory Visit (INDEPENDENT_AMBULATORY_CARE_PROVIDER_SITE_OTHER)

## 2024-02-05 NOTE — Progress Notes (Unsigned)
 Patient is here  for a postoperative visit s/p trans urethral resection of bladder tumor by Dr Ina on 01/13/24.  Here today to evaluate gross hematuria- possible void trial.    Patient reports that he is feeling well--Denies N/V/F/C, abdominal or pelvic pain, or other symptom of systemic infection.  Reports mimimal pain that is managed well.  Patients appetite is returning to normal, hes eating and drinking well, and is having regular bowel movements on stool softeners.  Foley catheter has been draining well; ***yellow urine is collecting in the catheter drainage bag.    Confirmed foley removal order.    Removed patient's 18 Fr Foley catheter and leg drainage bag without difficulty.  Patient tolerated catheter removal well.    Reviewed surgical pathology results with patient.  Copy of the surgical pathology report given to patient for his records.    Patient understands to increase fluids today, return to office if has not voided within 5-6 hours, or if having difficulty voiding.  Contact doctor if N/V/F/C, worsening gross hematuria, dysuria, new pain or other concern for infection.    May drive now that Foley catheter has been removed.  Do daily walking for exercise.  No heavy lifting for 3 more weeks, increase loads gradually.    Care of this patient reviewed and discussed with ***, the supervising physician.

## 2024-02-06 ENCOUNTER — Other Ambulatory Visit (INDEPENDENT_AMBULATORY_CARE_PROVIDER_SITE_OTHER): Payer: Self-pay | Admitting: Urology

## 2024-02-06 ENCOUNTER — Other Ambulatory Visit (INDEPENDENT_AMBULATORY_CARE_PROVIDER_SITE_OTHER): Payer: Self-pay | Admitting: Internal Medicine

## 2024-02-06 ENCOUNTER — Other Ambulatory Visit (INDEPENDENT_AMBULATORY_CARE_PROVIDER_SITE_OTHER)

## 2024-02-06 ENCOUNTER — Ambulatory Visit (INDEPENDENT_AMBULATORY_CARE_PROVIDER_SITE_OTHER)

## 2024-02-06 ENCOUNTER — Encounter (INDEPENDENT_AMBULATORY_CARE_PROVIDER_SITE_OTHER): Payer: Self-pay | Admitting: Urology

## 2024-02-06 VITALS — BP 158/78 | HR 99 | Temp 97.8°F

## 2024-02-06 DIAGNOSIS — I10 Essential (primary) hypertension: Secondary | ICD-10-CM

## 2024-02-06 DIAGNOSIS — C678 Malignant neoplasm of overlapping sites of bladder: Secondary | ICD-10-CM | POA: Insufficient documentation

## 2024-02-06 DIAGNOSIS — R310 Gross hematuria: Secondary | ICD-10-CM

## 2024-02-06 DIAGNOSIS — Z978 Presence of other specified devices: Secondary | ICD-10-CM

## 2024-02-06 DIAGNOSIS — E1165 Type 2 diabetes mellitus with hyperglycemia: Secondary | ICD-10-CM

## 2024-02-06 DIAGNOSIS — R3914 Feeling of incomplete bladder emptying: Secondary | ICD-10-CM

## 2024-02-06 DIAGNOSIS — D649 Anemia, unspecified: Secondary | ICD-10-CM

## 2024-02-06 DIAGNOSIS — N3289 Other specified disorders of bladder: Secondary | ICD-10-CM

## 2024-02-06 LAB — CBC
Absolute nRBC: 0 x10 3/uL (ref <=0.00)
Hematocrit: 37.1 % — ABNORMAL LOW (ref 37.6–49.6)
Hemoglobin: 11.7 g/dL — ABNORMAL LOW (ref 12.5–17.1)
MCH: 27.5 pg (ref 25.1–33.5)
MCHC: 31.5 g/dL (ref 31.5–35.8)
MCV: 87.1 fL (ref 78.0–96.0)
MPV: 9.5 fL (ref 8.9–12.5)
Platelet Count: 339 x10 3/uL (ref 142–346)
RBC: 4.26 x10 6/uL (ref 4.20–5.90)
RDW: 16 % — ABNORMAL HIGH (ref 11–15)
WBC: 11.23 x10 3/uL — ABNORMAL HIGH (ref 3.10–9.50)
nRBC %: 0 /100{WBCs} (ref <=0.0)

## 2024-02-06 LAB — BASIC METABOLIC PANEL
Anion Gap: 10 (ref 5.0–15.0)
BUN: 25 mg/dL (ref 9–28)
CO2: 23 meq/L (ref 17–29)
Calcium: 9.1 mg/dL (ref 7.9–10.2)
Chloride: 103 meq/L (ref 99–111)
Creatinine: 1.4 mg/dL (ref 0.5–1.5)
GFR: 52.1 mL/min/1.73 m2 — ABNORMAL LOW (ref >=60.0)
Glucose: 276 mg/dL — ABNORMAL HIGH (ref 70–100)
Hemolysis Index: 3 {index}
Potassium: 5 meq/L (ref 3.5–5.3)
Sodium: 136 meq/L (ref 135–145)

## 2024-02-06 NOTE — Progress Notes (Signed)
 Subjective:    Patient ID: Scott Monroe. is a 79 y.o. male     Chief Complaint:    79 y/o M who presented in consultation with a recent history of acute urinary retention, gross hematuria, and concern for  malignancy with bladder wall thickening on CT imaging.  Scott Monroe states that he has smoked cigarettes since he was 79 years old, and was previously smoking well over 1 pack per day.  He is currently under 1 pack per day, and his hematuria has resolved.  Due to the imaging findings and clinical picture, the recommendation was made to proceed with cystourethroscopy, TURBT, and installation of gemcitabine .  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  Hematuria is improving.    The following portions of the patient's history were reviewed and updated as appropriate: allergies, current medications, past family history, past medical history, past social history, past surgical history and problem list.    Review of Systems  History obtained from the patient  General ROS: negative for - chills, fatigue, fever or weight loss  Psychological ROS: negative for - depression, disorientation or memory difficulties  Ophthalmic ROS: negative for - blurry vision or loss of vision  Hematological and Lymphatic ROS: negative for - bleeding problems, night sweats or swollen lymph nodes  Endocrine ROS: negative for - malaise/lethargy, polydipsia/polyuria or skin changes  Respiratory ROS: no cough, shortness of breath, or wheezing  Cardiovascular ROS: no chest pain or dyspnea on exertion  Gastrointestinal ROS: no abdominal pain, change in bowel habits, or black or bloody stools  Genito-Urinary ROS: as per HPI  Musculoskeletal ROS: negative for - joint pain or muscle pain  Neurological ROS: negative for - headaches, impaired coordination/balance or numbness/tingling    Objective:    BP 158/78 (BP Site: Right arm, Patient Position: Sitting, Cuff Size: Medium)   Pulse 99   Temp 97.8 F (36.6 C)   SpO2 98%    General appearance - alert, well appearing, and in no distress  Mental status - alert, oriented to person, place, and time  Chest - no use of accessory respiratory muscles    Lab Review   Pathology reviewed    Radiology Review   None  Assessment:     1. Benign prostatic hyperplasia with incomplete bladder emptying    2. Bladder wall thickening    3. Indwelling Foley catheter present    4. Gross hematuria    5. Malignant neoplasm of overlapping sites of bladder (CMS/HCC)      Plan:      Patient Instructions   -Plan to proceed with a staging CT of the chest, abdomen, and pelvis  -Plan to proceed with Medical Oncology consultation  -We will discuss next steps once staging imaging completed.  -Foley catheter exchange highly recommended  -The phone numbers to schedule your imaging are as follows:  [facility] [phone]  [facility] [phone]         Orders  No orders of the defined types were placed in this encounter.

## 2024-02-06 NOTE — Patient Instructions (Addendum)
-  Plan to proceed with a staging CT of the chest, abdomen, and pelvis  -Plan to proceed with Medical Oncology consultation  -We will discuss next steps once staging imaging completed.  -Foley catheter exchange highly recommended  -The phone numbers to schedule your imaging are as follows:  [facility] [phone]  [facility] [phone]

## 2024-02-07 ENCOUNTER — Ambulatory Visit (INDEPENDENT_AMBULATORY_CARE_PROVIDER_SITE_OTHER): Payer: Self-pay | Admitting: Internal Medicine

## 2024-02-07 ENCOUNTER — Other Ambulatory Visit (INDEPENDENT_AMBULATORY_CARE_PROVIDER_SITE_OTHER): Payer: Self-pay | Admitting: Internal Medicine

## 2024-02-07 DIAGNOSIS — E1165 Type 2 diabetes mellitus with hyperglycemia: Secondary | ICD-10-CM

## 2024-02-07 LAB — HEMOGLOBIN A1C
Average Estimated Glucose: 185.8 mg/dL
Hemoglobin A1C: 8.1 % — ABNORMAL HIGH (ref 4.6–5.6)

## 2024-02-09 MED ORDER — EMPAGLIFLOZIN 10 MG PO TABS: 10.0000 mg | ORAL_TABLET | Freq: Every day | ORAL | 2 refills | Status: DC

## 2024-02-14 ENCOUNTER — Ambulatory Visit: Admission: RE | Admit: 2024-02-14 | Source: Ambulatory Visit

## 2024-02-18 ENCOUNTER — Ambulatory Visit: Admission: RE | Admit: 2024-02-18 | Discharge: 2024-02-18 | Attending: Urology

## 2024-02-18 DIAGNOSIS — C678 Malignant neoplasm of overlapping sites of bladder: Secondary | ICD-10-CM | POA: Insufficient documentation

## 2024-02-18 MED ORDER — IOHEXOL 350 MG/ML IV SOLN
100.0000 mL | Freq: Once | INTRAVENOUS | Status: AC | PRN
  Administered 2024-02-18: 100 mL via INTRAVENOUS

## 2024-02-19 ENCOUNTER — Other Ambulatory Visit (INDEPENDENT_AMBULATORY_CARE_PROVIDER_SITE_OTHER): Payer: Self-pay | Admitting: Internal Medicine

## 2024-02-19 DIAGNOSIS — E1165 Type 2 diabetes mellitus with hyperglycemia: Secondary | ICD-10-CM

## 2024-02-19 DIAGNOSIS — G629 Polyneuropathy, unspecified: Secondary | ICD-10-CM

## 2024-02-19 MED ORDER — EMPAGLIFLOZIN 10 MG PO TABS: 10.0000 mg | ORAL_TABLET | Freq: Every day | ORAL | 2 refills | Status: AC

## 2024-03-06 ENCOUNTER — Ambulatory Visit: Admit: 2024-03-06 | Admitting: Interventional Radiology and Diagnostic Radiology

## 2024-03-06 DIAGNOSIS — N139 Obstructive and reflux uropathy, unspecified: Secondary | ICD-10-CM | POA: Insufficient documentation

## 2024-03-24 ENCOUNTER — Other Ambulatory Visit (INDEPENDENT_AMBULATORY_CARE_PROVIDER_SITE_OTHER)

## 2024-03-24 ENCOUNTER — Ambulatory Visit (INDEPENDENT_AMBULATORY_CARE_PROVIDER_SITE_OTHER): Admitting: Physician Assistant

## 2024-03-31 ENCOUNTER — Ambulatory Visit (INDEPENDENT_AMBULATORY_CARE_PROVIDER_SITE_OTHER)
